# Patient Record
Sex: Female | Born: 1955 | Race: Black or African American | Hispanic: No | Marital: Single | Smoking: Former smoker
Health system: Southern US, Community
[De-identification: ages and names within clinical notes are randomized; demographics above are authoritative.]

## PROBLEM LIST (undated history)

## (undated) DIAGNOSIS — E785 Hyperlipidemia, unspecified: Secondary | ICD-10-CM

## (undated) DIAGNOSIS — H409 Unspecified glaucoma: Secondary | ICD-10-CM

## (undated) DIAGNOSIS — I10 Essential (primary) hypertension: Secondary | ICD-10-CM

## (undated) DIAGNOSIS — D649 Anemia, unspecified: Secondary | ICD-10-CM

## (undated) DIAGNOSIS — I639 Cerebral infarction, unspecified: Secondary | ICD-10-CM

## (undated) DIAGNOSIS — K219 Gastro-esophageal reflux disease without esophagitis: Secondary | ICD-10-CM

## (undated) DIAGNOSIS — E119 Type 2 diabetes mellitus without complications: Secondary | ICD-10-CM

## (undated) DIAGNOSIS — A4902 Methicillin resistant Staphylococcus aureus infection, unspecified site: Secondary | ICD-10-CM

## (undated) DIAGNOSIS — M1990 Unspecified osteoarthritis, unspecified site: Secondary | ICD-10-CM

## (undated) DIAGNOSIS — G4733 Obstructive sleep apnea (adult) (pediatric): Secondary | ICD-10-CM

## (undated) DIAGNOSIS — Z8719 Personal history of other diseases of the digestive system: Secondary | ICD-10-CM

## (undated) DIAGNOSIS — N183 Chronic kidney disease, stage 3 (moderate): Secondary | ICD-10-CM

## (undated) HISTORY — DX: Gastro-esophageal reflux disease without esophagitis: K21.9

## (undated) HISTORY — DX: Essential (primary) hypertension: I10

## (undated) HISTORY — DX: Type 2 diabetes mellitus without complications: E11.9

## (undated) HISTORY — DX: Unspecified glaucoma: H40.9

## (undated) HISTORY — DX: Obstructive sleep apnea (adult) (pediatric): G47.33

## (undated) HISTORY — DX: Anemia, unspecified: D64.9

## (undated) HISTORY — PX: MULTIPLE TOOTH EXTRACTIONS: SHX2053

## (undated) HISTORY — DX: Chronic kidney disease, stage 3 (moderate): N18.3

## (undated) HISTORY — DX: Hyperlipidemia, unspecified: E78.5

## (undated) HISTORY — PX: OOPHORECTOMY: SHX86

---
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
## 1997-06-05 ENCOUNTER — Emergency Department (HOSPITAL_COMMUNITY): Admission: EM | Admit: 1997-06-05 | Discharge: 1997-06-05 | Payer: Self-pay | Admitting: Emergency Medicine

## 2008-08-07 ENCOUNTER — Emergency Department (HOSPITAL_COMMUNITY): Admission: EM | Admit: 2008-08-07 | Discharge: 2008-08-07 | Payer: Self-pay | Admitting: Emergency Medicine

## 2009-01-17 HISTORY — PX: I & D EXTREMITY: SHX5045

## 2009-06-11 ENCOUNTER — Ambulatory Visit: Payer: Self-pay | Admitting: Vascular Surgery

## 2009-06-11 ENCOUNTER — Emergency Department (HOSPITAL_COMMUNITY): Admission: EM | Admit: 2009-06-11 | Discharge: 2009-06-12 | Payer: Self-pay | Admitting: Emergency Medicine

## 2009-06-11 ENCOUNTER — Encounter (INDEPENDENT_AMBULATORY_CARE_PROVIDER_SITE_OTHER): Payer: Self-pay | Admitting: Emergency Medicine

## 2009-06-12 ENCOUNTER — Encounter (INDEPENDENT_AMBULATORY_CARE_PROVIDER_SITE_OTHER): Payer: Self-pay | Admitting: Internal Medicine

## 2009-06-13 ENCOUNTER — Encounter: Payer: Self-pay | Admitting: Internal Medicine

## 2009-06-14 ENCOUNTER — Emergency Department (HOSPITAL_COMMUNITY): Admission: EM | Admit: 2009-06-14 | Discharge: 2009-06-14 | Payer: Self-pay | Admitting: Emergency Medicine

## 2009-06-16 ENCOUNTER — Emergency Department (HOSPITAL_COMMUNITY): Admission: EM | Admit: 2009-06-16 | Discharge: 2009-06-16 | Payer: Self-pay | Admitting: Emergency Medicine

## 2009-06-17 ENCOUNTER — Ambulatory Visit: Payer: Self-pay | Admitting: Internal Medicine

## 2009-06-17 DIAGNOSIS — E118 Type 2 diabetes mellitus with unspecified complications: Secondary | ICD-10-CM | POA: Insufficient documentation

## 2009-06-17 DIAGNOSIS — E1122 Type 2 diabetes mellitus with diabetic chronic kidney disease: Secondary | ICD-10-CM | POA: Insufficient documentation

## 2009-06-17 DIAGNOSIS — I10 Essential (primary) hypertension: Secondary | ICD-10-CM

## 2009-06-17 DIAGNOSIS — E119 Type 2 diabetes mellitus without complications: Secondary | ICD-10-CM | POA: Insufficient documentation

## 2009-06-17 DIAGNOSIS — N183 Chronic kidney disease, stage 3 unspecified: Secondary | ICD-10-CM | POA: Insufficient documentation

## 2009-06-17 HISTORY — DX: Type 2 diabetes mellitus without complications: E11.9

## 2009-06-17 HISTORY — DX: Essential (primary) hypertension: I10

## 2009-06-17 LAB — CONVERTED CEMR LAB
ALT: <8 units/L (ref 0–35)
AST: 13 units/L (ref 0–37)
Albumin: 3.9 g/dL (ref 3.5–5.2)
Alkaline Phosphatase: 61 units/L (ref 39–117)
BUN: 23 mg/dL (ref 6–23)
CO2: 25 meq/L (ref 19–32)
Calcium: 10.5 mg/dL (ref 8.4–10.5)
Chloride: 100 meq/L (ref 96–112)
Cholesterol: 151 mg/dL (ref 0–200)
Creatinine, Ser: 0.91 mg/dL (ref 0.40–1.20)
Creatinine, Urine: 132.4 mg/dL
Free T4: 1.19 ng/dL (ref 0.80–1.80)
Glucose, Bld: 138 mg/dL — ABNORMAL HIGH (ref 70–99)
HDL: 46 mg/dL (ref >39)
LDL Cholesterol: 84 mg/dL (ref 0–99)
Microalb Creat Ratio: 18.1 mg/g (ref 0.0–30.0)
Microalb, Ur: 2.4 mg/dL — ABNORMAL HIGH (ref 0.00–1.89)
Potassium: 4.6 meq/L (ref 3.5–5.3)
Sodium: 138 meq/L (ref 135–145)
TSH: 1.462 microintl units/mL (ref 0.350–4.5)
Total Bilirubin: 0.4 mg/dL (ref 0.3–1.2)
Total CHOL/HDL Ratio: 3.3
Total Protein: 8 g/dL (ref 6.0–8.3)
Triglycerides: 105 mg/dL (ref <150)
VLDL: 21 mg/dL (ref 0–40)

## 2009-07-01 ENCOUNTER — Ambulatory Visit: Payer: Self-pay | Admitting: Internal Medicine

## 2009-07-01 LAB — CONVERTED CEMR LAB: Blood Glucose, Home Monitor: 4 mg/dL

## 2009-07-06 ENCOUNTER — Telehealth: Payer: Self-pay | Admitting: Internal Medicine

## 2009-08-07 ENCOUNTER — Ambulatory Visit: Payer: Self-pay | Admitting: Internal Medicine

## 2009-08-07 ENCOUNTER — Encounter: Payer: Self-pay | Admitting: Internal Medicine

## 2009-08-07 LAB — CONVERTED CEMR LAB
Blood Glucose, Fingerstick: 129
Hgb A1c MFr Bld: 8.5 %

## 2009-09-22 ENCOUNTER — Telehealth: Payer: Self-pay | Admitting: *Deleted

## 2009-09-24 ENCOUNTER — Ambulatory Visit: Payer: Self-pay | Admitting: Internal Medicine

## 2009-09-24 ENCOUNTER — Ambulatory Visit: Payer: Self-pay | Admitting: Surgery

## 2009-09-24 ENCOUNTER — Ambulatory Visit: Admission: RE | Admit: 2009-09-24 | Discharge: 2009-09-24 | Payer: Self-pay | Admitting: Internal Medicine

## 2009-09-24 ENCOUNTER — Encounter: Payer: Self-pay | Admitting: Internal Medicine

## 2009-09-24 DIAGNOSIS — R609 Edema, unspecified: Secondary | ICD-10-CM | POA: Insufficient documentation

## 2009-09-25 ENCOUNTER — Encounter: Payer: Self-pay | Admitting: Internal Medicine

## 2009-10-06 ENCOUNTER — Ambulatory Visit (HOSPITAL_COMMUNITY): Admission: RE | Admit: 2009-10-06 | Discharge: 2009-10-06 | Payer: Self-pay | Admitting: Internal Medicine

## 2009-10-08 ENCOUNTER — Ambulatory Visit: Payer: Self-pay | Admitting: Internal Medicine

## 2009-10-08 LAB — CONVERTED CEMR LAB
Blood Glucose, Fingerstick: 137
Hgb A1c MFr Bld: 6.1 %

## 2009-10-28 ENCOUNTER — Ambulatory Visit: Payer: Self-pay | Admitting: Internal Medicine

## 2009-10-28 DIAGNOSIS — S91109A Unspecified open wound of unspecified toe(s) without damage to nail, initial encounter: Secondary | ICD-10-CM | POA: Insufficient documentation

## 2009-10-28 LAB — CONVERTED CEMR LAB: Blood Glucose, Fingerstick: 128

## 2009-11-03 ENCOUNTER — Encounter (HOSPITAL_BASED_OUTPATIENT_CLINIC_OR_DEPARTMENT_OTHER): Admission: RE | Admit: 2009-11-03 | Discharge: 2009-12-04 | Payer: Self-pay | Admitting: General Surgery

## 2009-11-04 ENCOUNTER — Ambulatory Visit (HOSPITAL_COMMUNITY): Admission: RE | Admit: 2009-11-04 | Discharge: 2009-11-04 | Payer: Self-pay | Admitting: General Surgery

## 2009-11-09 ENCOUNTER — Encounter: Payer: Self-pay | Admitting: Internal Medicine

## 2009-11-19 ENCOUNTER — Encounter: Payer: Self-pay | Admitting: Internal Medicine

## 2009-11-30 ENCOUNTER — Ambulatory Visit: Payer: Self-pay | Admitting: Internal Medicine

## 2009-11-30 ENCOUNTER — Encounter: Payer: Self-pay | Admitting: Internal Medicine

## 2009-11-30 LAB — CONVERTED CEMR LAB: Blood Glucose, Fingerstick: 92

## 2009-12-02 ENCOUNTER — Ambulatory Visit: Payer: Self-pay | Admitting: Vascular Surgery

## 2009-12-04 ENCOUNTER — Emergency Department (HOSPITAL_COMMUNITY)
Admission: EM | Admit: 2009-12-04 | Discharge: 2009-12-04 | Payer: Self-pay | Source: Home / Self Care | Admitting: Emergency Medicine

## 2009-12-14 ENCOUNTER — Encounter: Payer: Self-pay | Admitting: Internal Medicine

## 2009-12-17 ENCOUNTER — Ambulatory Visit: Payer: Self-pay | Admitting: Internal Medicine

## 2009-12-17 LAB — CONVERTED CEMR LAB
Blood Glucose, Fingerstick: 61
Hgb A1c MFr Bld: 5.7 %

## 2009-12-18 ENCOUNTER — Ambulatory Visit: Payer: Self-pay | Admitting: Internal Medicine

## 2009-12-18 LAB — CONVERTED CEMR LAB
BUN: 19 mg/dL (ref 6–23)
CO2: 27 meq/L (ref 19–32)
Calcium: 9.7 mg/dL (ref 8.4–10.5)
Chloride: 103 meq/L (ref 96–112)
Creatinine, Ser: 0.84 mg/dL (ref 0.40–1.20)
Glucose, Bld: 172 mg/dL — ABNORMAL HIGH (ref 70–99)
Potassium: 4 meq/L (ref 3.5–5.3)
Sodium: 138 meq/L (ref 135–145)

## 2009-12-28 ENCOUNTER — Ambulatory Visit: Payer: Self-pay | Admitting: Vascular Surgery

## 2009-12-28 ENCOUNTER — Encounter: Payer: Self-pay | Admitting: Internal Medicine

## 2009-12-29 ENCOUNTER — Ambulatory Visit: Payer: Self-pay | Admitting: Internal Medicine

## 2009-12-29 LAB — CONVERTED CEMR LAB: Blood Glucose, Fingerstick: 139

## 2009-12-29 LAB — HM DIABETES FOOT EXAM

## 2009-12-29 LAB — HM DIABETES EYE EXAM: HM Diabetic Eye Exam: NORMAL

## 2010-01-01 ENCOUNTER — Ambulatory Visit (HOSPITAL_COMMUNITY)
Admission: RE | Admit: 2010-01-01 | Discharge: 2010-01-01 | Payer: Self-pay | Source: Home / Self Care | Attending: Internal Medicine | Admitting: Internal Medicine

## 2010-01-01 ENCOUNTER — Telehealth: Payer: Self-pay | Admitting: *Deleted

## 2010-01-04 ENCOUNTER — Ambulatory Visit: Payer: Self-pay | Admitting: Vascular Surgery

## 2010-01-06 ENCOUNTER — Telehealth: Payer: Self-pay | Admitting: *Deleted

## 2010-01-07 ENCOUNTER — Encounter: Payer: Self-pay | Admitting: Internal Medicine

## 2010-01-07 ENCOUNTER — Ambulatory Visit: Payer: Self-pay | Admitting: Internal Medicine

## 2010-01-07 ENCOUNTER — Inpatient Hospital Stay (HOSPITAL_COMMUNITY)
Admission: AD | Admit: 2010-01-07 | Discharge: 2010-01-15 | Payer: Self-pay | Source: Home / Self Care | Attending: Internal Medicine | Admitting: Internal Medicine

## 2010-01-07 DIAGNOSIS — L97529 Non-pressure chronic ulcer of other part of left foot with unspecified severity: Secondary | ICD-10-CM

## 2010-01-07 DIAGNOSIS — E11621 Type 2 diabetes mellitus with foot ulcer: Secondary | ICD-10-CM | POA: Insufficient documentation

## 2010-01-07 HISTORY — DX: Type 2 diabetes mellitus with foot ulcer: L97.529

## 2010-01-07 HISTORY — DX: Type 2 diabetes mellitus with foot ulcer: E11.621

## 2010-01-08 ENCOUNTER — Encounter: Payer: Self-pay | Admitting: Internal Medicine

## 2010-01-12 ENCOUNTER — Encounter: Payer: Self-pay | Admitting: Ophthalmology

## 2010-01-12 DIAGNOSIS — D509 Iron deficiency anemia, unspecified: Secondary | ICD-10-CM | POA: Insufficient documentation

## 2010-01-12 DIAGNOSIS — D649 Anemia, unspecified: Secondary | ICD-10-CM | POA: Insufficient documentation

## 2010-01-13 ENCOUNTER — Encounter: Payer: Self-pay | Admitting: Internal Medicine

## 2010-01-15 ENCOUNTER — Encounter: Payer: Self-pay | Admitting: Internal Medicine

## 2010-01-22 ENCOUNTER — Ambulatory Visit: Admission: RE | Admit: 2010-01-22 | Discharge: 2010-01-22 | Payer: Self-pay | Source: Home / Self Care

## 2010-01-22 LAB — CONVERTED CEMR LAB: Blood Glucose, Fingerstick: 154

## 2010-01-22 LAB — GLUCOSE, CAPILLARY: Glucose-Capillary: 154 mg/dL — ABNORMAL HIGH (ref 70–99)

## 2010-01-24 LAB — CONVERTED CEMR LAB
BUN: 36 mg/dL — ABNORMAL HIGH (ref 6–23)
Basophils Absolute: 0 10*3/uL (ref 0.0–0.1)
Basophils Relative: 1 % (ref 0–1)
CO2: 27 meq/L (ref 19–32)
Calcium: 9.5 mg/dL (ref 8.4–10.5)
Chloride: 101 meq/L (ref 96–112)
Creatinine, Ser: 0.94 mg/dL (ref 0.40–1.20)
Eosinophils Absolute: 0.4 10*3/uL (ref 0.0–0.7)
Eosinophils Relative: 4 % (ref 0–5)
Glucose, Bld: 114 mg/dL — ABNORMAL HIGH (ref 70–99)
HCT: 30.9 % — ABNORMAL LOW (ref 36.0–46.0)
Hemoglobin: 10.2 g/dL — ABNORMAL LOW (ref 12.0–15.0)
Lymphocytes Relative: 30 % (ref 12–46)
Lymphs Abs: 2.7 10*3/uL (ref 0.7–4.0)
MCHC: 33 g/dL (ref 30.0–36.0)
MCV: 82.6 fL (ref 78.0–100.0)
Monocytes Absolute: 0.7 10*3/uL (ref 0.1–1.0)
Monocytes Relative: 8 % (ref 3–12)
Neutro Abs: 5.1 10*3/uL (ref 1.7–7.7)
Neutrophils Relative %: 57 % (ref 43–77)
Platelets: 320 10*3/uL (ref 150–400)
Potassium: 5.1 meq/L (ref 3.5–5.3)
RBC: 3.74 M/uL — ABNORMAL LOW (ref 3.87–5.11)
RDW: 16.1 % — ABNORMAL HIGH (ref 11.5–15.5)
Sodium: 136 meq/L (ref 135–145)
WBC: 8.8 10*3/uL (ref 4.0–10.5)

## 2010-01-25 ENCOUNTER — Encounter (HOSPITAL_BASED_OUTPATIENT_CLINIC_OR_DEPARTMENT_OTHER)
Admission: RE | Admit: 2010-01-25 | Discharge: 2010-02-16 | Payer: Self-pay | Source: Home / Self Care | Attending: Internal Medicine | Admitting: Internal Medicine

## 2010-02-01 LAB — GLUCOSE, CAPILLARY: Glucose-Capillary: 245 mg/dL — ABNORMAL HIGH (ref 70–99)

## 2010-02-03 LAB — GLUCOSE, CAPILLARY: Glucose-Capillary: 144 mg/dL — ABNORMAL HIGH (ref 70–99)

## 2010-02-07 NOTE — Consult Note (Signed)
[name], [name]              ACCOUNT NO.:  [id number]  MEDICAL RECORD NO.:  [id number]          PATIENT TYPE:  INP  LOCATION:  5509                         FACILITY:  [facility]  PHYSICIAN:  Willis Modena, MD     DATE OF BIRTH:  [birth year]  DATE OF CONSULTATION:  01/11/2010 DATE OF DISCHARGE:                                CONSULTATION   REASON FOR CONSULTATION:  Normocytic anemia.  CONSULTING PHYSICIAN:  Tilford Pillar, MD  CHIEF COMPLAINT:  Leg pain.  HISTORY OF PRESENT ILLNESS:  [name] is a 55 year old female with diabetes who was recently admitted for left lower extremity foot pain. She was found to have an infected diabetic foot ulcer that underwent irrigation and debridement, incision and drainage.  I was asked to see [name] for evaluation of anemia.  During her evaluation here, she has had a slow drop in hemoglobin from 8.6-7.7.  She is Hemoccult negative. She has no abdominal pain, nausea, vomiting, odynophagia, heartburn, change in bowel habits, hematemesis, melena, or hematochezia.  She has had some chronic dysphagia for the past several months postdating and intubation.  She has never had an endoscopy or colonoscopy.  There is no known family history of colon cancer or colon polyps.  PAST MEDICAL HISTORY:  Type 2 diabetes, hypertension, and obesity.  ALLERGIES:  CODEINE.  MEDICATIONS AT HOME:  Metformin, lisinopril, and multivitamin.  SOCIAL HISTORY:  Lives in [place], Runner, broadcasting/film/video for special occasion, applying job at [employer] where apparently is on. Single.  No children.  Remote tobacco.  No alcohol or illicit drugs.  FAMILY HISTORY:  Mother has diabetes.  Father with hypertension and coronary artery disease.  No family history of colon cancer or colon polyps.  REVIEW OF SYSTEMS:  As per history of present illness.  All other systems were explored in detail and are negative.  PHYSICAL EXAMINATION:  VITAL SIGNS:  Blood pressure  142/84, heart rate 80, respiratory rate 20, temperature 98.6, and oxygen saturation 95% on room air. GENERAL:  [name] appears her stated age.  She is in no acute distress. ENT:  Normocephalic and atraumatic.  She has poor dentition.  No oropharyngeal lesions.  No thrush. EYES:  Sclerae are anicteric.  Conjunctivae are slightly pale. NECK:  Supple without thyromegaly, lymphadenopathy, or bruits. LUNGS:  Clear to auscultation without rales, rhonchi, or wheeze. HEART:  Regular rhythm, normal rate, without murmur, rub, or gallop. ABDOMEN:  Soft, nontender, and nondistended.  No liver or splenic enlargement.  No bulging flanks to suggest ascites. EXTREMITIES:  She has left lower extremity edema from her mid shin down to her foot, there is some tenderness.  I did not evaluate her surgical wound. NEUROLOGIC:  Diffusely weak, but also some tenderness and weakness of her left lower extremity after her surgery, otherwise nonfocal without lateralizing signs. PSYCHIATRIC:  Normal mood and affect. LYMPHATIC:  No palpable axillary, submandibular, or supraclavicular adenopathy. SKIN:  No rash or ecchymosis.  She has a surgical wound to her left foot as mentioned.  LABORATORY STUDIES:  White count 8.4, hemoglobin 7.7, and platelet count 264.  Sodium 138, potassium 4.7,  chloride 105, bicarb 29, BUN 8, creatinine 0.8.  Actually, liver tests were not done.  Hemoccult, she is heme negative.  IMPRESSION:  [name] is a 55 year old female with left diabetic foot ulcer with abscess, status post incision and drainage about 2 days ago. I have been asked to see her for normocytic anemia with heme-negative stools.  PLAN: 1. I agree with supportive management of her diabetic foot ulcer. 2. I think she would benefit from an endoscopy and colonoscopy.  I     would favor doing it as an outpatient given the troubles with a     bowel prep in the setting of her recent foot surgery.  However, I     will  discuss this with her.  Thanks again for allowing me to participate in [name]'s care.     Willis Modena, MD     WO/MEDQ  D:  01/11/2010  T:  01/12/2010  Job:  [id number]  Electronically Signed by Willis Modena  on 02/07/2010 03:26:52 PM

## 2010-02-15 ENCOUNTER — Ambulatory Visit: Admission: RE | Admit: 2010-02-15 | Discharge: 2010-02-15 | Payer: Self-pay | Source: Home / Self Care

## 2010-02-15 LAB — GLUCOSE, CAPILLARY: Glucose-Capillary: 105 mg/dL — ABNORMAL HIGH (ref 70–99)

## 2010-02-17 ENCOUNTER — Other Ambulatory Visit: Payer: Self-pay

## 2010-02-17 ENCOUNTER — Encounter (HOSPITAL_BASED_OUTPATIENT_CLINIC_OR_DEPARTMENT_OTHER): Payer: Self-pay | Attending: General Surgery

## 2010-02-17 DIAGNOSIS — L97509 Non-pressure chronic ulcer of other part of unspecified foot with unspecified severity: Secondary | ICD-10-CM | POA: Insufficient documentation

## 2010-02-17 DIAGNOSIS — E1169 Type 2 diabetes mellitus with other specified complication: Secondary | ICD-10-CM | POA: Insufficient documentation

## 2010-02-17 DIAGNOSIS — I10 Essential (primary) hypertension: Secondary | ICD-10-CM | POA: Insufficient documentation

## 2010-02-18 ENCOUNTER — Other Ambulatory Visit: Payer: Self-pay

## 2010-02-18 NOTE — Discharge Summary (Signed)
Summary: Hospital Discharge Update    Hospital Discharge Update:  Date of Admission: 01/07/2010 Date of Discharge: 01/14/2010  Brief Summary:  This is a 55 year old female with hx of DM and HTN who initially presented to the clinic for a worsening draining blister hear her heal and foot swelling.  MRI was done which showed a hind foot abscess, cellulitis and posterior compartment myositis.  Surgery performed and I+D on 12/24.  The patient was placed on IV antibiotics.   Upon admission, pt was also found to be anemic at 7.8 and though B12, ferritin, and folate were all normal, pts iron was low at 11 and it was felt that should could have iron deficiency anemia of an unknown cause.  As a result, GI was consulted to perform a colonoscopy and EGD. Colonoscopy was normal and a 5 year follow up was recommended.  EGD showed a GE junction nodule of which a biopsy was taken.  Duodenal biopsies were also taken to check for celiac disease.  Pt was discharged with a 7 day course of augmentin and doxycycline.   Lab or other results pending at discharge:  Pathology reports from upper endoscopy.  Labs needed at follow-up: CBC with differential, Basic metabolic panel  Other follow-up issues:  Pt's BP was elevated in the hospital.  HCTZ was added.  Rechek BP and consider adjusting medications.  Check BMET given initation of HCTZ.  Continue to work up pt's anemia.    Problem list changes:  Added new problem of ANEMIA, NORMOCYTIC (ICD-285.9)  Medication list changes:  Added new medication of HYDROCHLOROTHIAZIDE 25 MG TABS (HYDROCHLOROTHIAZIDE) Take 1 tablet by mouth once a day - Signed Added new medication of AUGMENTIN 500-125 MG TABS (AMOXICILLIN-POT CLAVULANATE) Take 1 tablet by mouth two times a day for 7 days. - Signed Added new medication of DOXYCYCLINE HYCLATE 100 MG CAPS (DOXYCYCLINE HYCLATE) Take 1 tablet by mouth two times a day for 7 days. - Signed Rx of HYDROCHLOROTHIAZIDE 25 MG TABS  (HYDROCHLOROTHIAZIDE) Take 1 tablet by mouth once a day;  #30 x 0;  Signed;  Entered by: Sinda Du MD;  Authorized by: Sinda Du MD;  Method used: Print then Give to Patient Rx of AUGMENTIN 500-125 MG TABS (AMOXICILLIN-POT CLAVULANATE) Take 1 tablet by mouth two times a day for 7 days.;  #14 x 0;  Signed;  Entered by: Sinda Du MD;  Authorized by: Sinda Du MD;  Method used: Print then Give to Patient Rx of DOXYCYCLINE HYCLATE 100 MG CAPS (DOXYCYCLINE HYCLATE) Take 1 tablet by mouth two times a day for 7 days.;  #14 x 0;  Signed;  Entered by: Sinda Du MD;  Authorized by: Sinda Du MD;  Method used: Print then Give to Patient  The medication, problem, and allergy lists have been updated.  Please see the dictated discharge summary for details.  Discharge medications:  METFORMIN HCL 1000 MG TABS (METFORMIN HCL) take one tablet daily LISINOPRIL 40 MG TABS (LISINOPRIL) Take one tablet by mouth daily for Blood pressure MULTIVITAMINS  TABS (MULTIPLE VITAMIN) one tablet once a day HYDROCHLOROTHIAZIDE 25 MG TABS (HYDROCHLOROTHIAZIDE) Take 1 tablet by mouth once a day AUGMENTIN 500-125 MG TABS (AMOXICILLIN-POT CLAVULANATE) Take 1 tablet by mouth two times a day for 7 days. DOXYCYCLINE HYCLATE 100 MG CAPS (DOXYCYCLINE HYCLATE) Take 1 tablet by mouth two times a day for 7 days.  Other patient instructions:  Please come for an appointment at the outpatient clinic at [facility] on 1/6  at 2:30 pm with Dr. Tonny Branch for a followup visit.  Please follow up with Dr. Gayla Doss in 1 week call [phone] to make an appointment. Please follow up with the wound care center at [facility] in 1 week for hydrotherapy.  Call [phone] to make an appointment.   Home health will assist you with your dressing changes, please remember to soak your foot daily as directed by Dr. Magnus Ivan.   Please take your medication as prescribed below.  If you have any problem, Please call the clinic.   In  case of an emergency  dial 911 or go to the emergency department.   Note: Hospital Discharge Medications & Other Instructions handout was printed, one copy for patient and a second copy to be placed in hospital chart.

## 2010-02-18 NOTE — Progress Notes (Signed)
Summary: Refill/gh  Phone Note Refill Request Message from:  Patient on July 06, 2009 4:42 PM  Refills Requested: Medication #1:  METFORMIN HCL 1000 MG TABS take 1 pill by mouth two times a day.  Medication #2:  GLIMEPIRIDE 4 MG TABS take 1 pill by mouth daily. Last vist was 07/01/2009.  Last labs were 06/17/2009   Method Requested: Electronic Initial call taken by: Angelina Ok RN,  July 06, 2009 4:43 PM  Follow-up for Phone Call        Pt doesn't have RTC appt so sent flag to [facility] Lissa Hoard to make appt mid july.   Follow-up by: Blanch Media MD,  July 06, 2009 5:01 PM    Prescriptions: METFORMIN HCL 1000 MG TABS (METFORMIN HCL) take 1 pill by mouth two times a day.  #60 x 1   Entered and Authorized by:   Blanch Media MD   Signed by:   Blanch Media MD on 07/06/2009   Method used:   Electronically to        [facility] [phone]* (retail)       [address]       [place], [place]  [address]       Ph: [phone]       Fax: [phone]   RxID:   [id number] GLIMEPIRIDE 4 MG TABS (GLIMEPIRIDE) take 1 pill by mouth daily.  #30 x 1   Entered and Authorized by:   Blanch Media MD   Signed by:   Blanch Media MD on 07/06/2009   Method used:   Electronically to        [facility] [phone]* (retail)       [address]       [place], [place]  [address]       Ph: [phone]       Fax: [phone]   RxID:   [id number]

## 2010-02-18 NOTE — Progress Notes (Signed)
  Phone Note Call from Patient   Summary of Call: pt had ct this am 12/16, wants to know for sure when to restart metformin...mon 12/19 Initial call taken by: Marin Roberts RN,  January 05, 2010 4:30 PM  Follow-up for Phone Call        That will be fine. Follow-up by: Zoila Shutter MD,  January 01, 2010 2:03 PM  Additional Follow-up for Phone Call Additional follow up Details #1::        pt informed Additional Follow-up by: Marin Roberts RN,  January 05, 2010 4:31 PM

## 2010-02-18 NOTE — Assessment & Plan Note (Signed)
Summary: DIABETES TEACHING PER dR. pOKHAREL/[facility]   Allergies: 1)  ! Codeine   Complete Medication List: 1)  Glimepiride 4 Mg Tabs (Glimepiride) .... Take 1 pill by mouth daily. 2)  Metformin Hcl 1000 Mg Tabs (Metformin hcl) .... Take 1 pill by mouth two times a day. 3)  Lisinopril 10 Mg Tabs (Lisinopril) .... Take 1 pill by mouth daily. 4)  Bactrim Ds 800-160 Mg Tabs (Sulfamethoxazole-trimethoprim) .... Take 1 pill by mouth two times a day.  Other Orders: DSMT(Medicare) Individual, 30 Minutes (G0108)  Diabetes Self Management Training  PCP: Leodis Sias MD Date diagnosed with diabetes: 06/03/2009 Diabetes Type: Type 2 non-insulin Current smoking Status: quit  Diabetes Medications:  Comments: [facility] relion made by Tarry [employer]- cannot download it, CBg down form 300-400 2-3 weeks ago to 80-130 past week, some 60s nad she says she felt loopy- has significanlty cut back on food intake nad carbs- may level out hwne she eats carbs more reasonably and consistently.  Did not know anyhitng about her medications for diabetes today.     Monitoring Self monitoring blood glucose 4 times a day  Recent Episodes of: Requiring Help from another person  Hypoglycemia: Yes     Estimated /Usual Carb Intake Breakfast # of Carbs/Grams cheerios, banana and milk, coffee Lunch # of Carbs/Grams 1 chicken leg boiled, 1/2 slice bread, 5 pieces peaches in lite syrpu Midafternoon # of Carbs/Grams fruit Dinner # of Carbs/Grams salad wiht rotisserie chicken, or beets, turnip greens, trout, yams Diabetes Disease Process  Discussed today Define diabetes in simple terms: No knowledge   State own type of diabetes: No knowledgeState diabetes is treated by meal plan-exercise-medication-monitoring-education: Needs review/assistance    Medications State name-action-dose-duration-side effects-and time to take medication: No knowledge   State appropriate timing of food related to medication: No  knowledge    Nutritional Management Identify what foods most often affect blood glucose: Needs review/assistance    Verbalize importance of controlling food portions: Demonstrates competency   State importance of spacing and not omitting meals and snacks: Needs review/assistance   State changes planned for home meals/snacks: Needs review/assistance    Monitoring State purpose and frequency of monitoring BG-ketones-HgbA1C  : Needs review/assistance   Perform glucose monitoring/ketone testing and record results correctly: Demonstrates competencyState target blood glucose and HgbA1C goals: Needs review/assistance    Complications State the causes-signs and symptoms and prevention of Hyperglycemia: No knowledgeExplain proper treatment of hyperglycemia: No knowledgeState the causes- signs and symptoms and prevention of hypoglycemia: No knowledge   Explain proper treatment of hypoglycemia: Needs review/assistance   Glucose control and the development/prevention of long-term complications: Demonstrates competencyState benefits-risks-and options for improving blood sugar control: Demonstrates competencyB/P and lipid control in the prevention/control of cardiovascular disease: Demonstrates competency Exercise  Lifestyle changes:Goal setting and Problem solving State benefits of making appropriate lifestyle changes: Demonstrates competencyIdentify lifestyle behaviors that need to change: Demonstrates competencyIdentify risk factors that interfere with health: Needs review/assistanceDevelop strategies to reduce risk factors: No knowledgeVerbalize need for and frequency of health care follow-up: No knowledgeDiabetes Management Education Done: 07/01/2009    BEHAVIORAL GOALS INITIAL Incorporating appropriate nutritional management: try to eat at least 2-3 servings carb at each meal        Diabetes Self Management Support: friend, clinic visits, church family Follow-up:4 weeks if you don;t move to  [facility]

## 2010-02-18 NOTE — Letter (Signed)
Summary: BLOOD GLUCOSE/06-13-04-15-2011  BLOOD GLUCOSE/06-13-04-15-2011   Imported By: Margie Billet 07/03/2009 10:42:04  _____________________________________________________________________  External Attachment:    Type:   Image     Comment:   External Document

## 2010-02-18 NOTE — Assessment & Plan Note (Signed)
Summary: L foot blister/pcp-pribula/hla   Vital Signs:  Patient profile:   55 year old female Height:      68.5 inches (173.99 cm) Weight:      237.1 pounds (107.77 kg) BMI:     35.65 Temp:     99.2 degrees F oral Pulse rate:   107 / minute BP sitting:   178 / 91  (right arm) Cuff size:   large  Vitals Entered By: Cynda Familia Duncan Dull) (January 07, 2010 3:10 PM) CC: ULCER-LEFT FOOT- Is Patient Diabetic? Yes Did you bring your meter with you today? Yes Nutritional Status BMI of > 30 = obese  Have you ever been in a relationship where you felt threatened, hurt or afraid?No   Does patient need assistance? Functional Status Self care Ambulation Normal   Primary Care Provider:  Leodis Sias MD  CC:  ULCER-LEFT FOOT-.  History of Present Illness: [name] is a pleasant 55 year old woman with pmh significant for DM, HTN, and diabetic foot ulcers, who presents to the clinic today for worsening blister and swelling of left foot.   The patient has had left leg and foot swelling since August, which she initially thought was due to an injury. Over the past few months, the edema has persisted and she has undergone vascular studies and was seen at Dr. Candie Chroman office 12/28/2009. She had doppler studies performed then which showed severe reflux in the deep system throughout. In the context of his note, he outlines that patient may have had DVT in the past, which cause valvular incompetence. Her venous duplex study was negative that day. She had no open draining ulcers, and thus was advised to wear ted-hose stockings, keep her legs elevated, and diuretic as needed.  She followed up at [facility] the following day, and had  CT abd/pelvis done to r/u any lymphatic obstruction causing left leg edema, which also was negative. She returns to clinic today for left foot lesion and worsening swelling. She states it started out as a blister/callus. Over the past couple of days, it had popped open. It is  located on the heel of the left foot and extends to mid-plantar region. She states it has been draining, with some occasional blood. She said the odor has become quite foul over the past 2 days. She has not taken her temperature, but says she has been feeling warmer than usual. She denies chills, abdominal pain, changes in bowel or urinary habits. She denies having any significant pain in that area, but notes, she lacks a lot of sensation in her feet due to diabetes. She denies recent trauma to foot. She does very little walking, and has been wearing white compression stocking as directed. She has also tried her best to keep her legs elevated.   Current Medications (verified): 1)  Metformin Hcl 1000 Mg Tabs (Metformin Hcl) .... Take One Tablet Daily 2)  Lisinopril 40 Mg Tabs (Lisinopril) .... Take One Tablet By Mouth Daily For Blood Pressure 3)  Multivitamins  Tabs (Multiple Vitamin) .... One Tablet Once A Day  Allergies: 1)  ! Codeine  Past History:  Past Medical History: Last updated: 08/07/2009 Morbid obesity formerly over 400 lbs.  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 Smoked when she was a teenager no alcohol no Drugs  Risk Factors: Alcohol Use: 0 (12/29/2009) Exercise: yes (12/29/2009)  Risk Factors: Smoking Status: quit (12/29/2009)  Review of Systems      See HPI  Physical Exam  General:  alert and well-developed.  BP elevated Mouth:  poor dentition and teeth missing.   Neck:  supple.   Lungs:  normal respiratory effort and normal breath sounds.   Heart:  regular rhythm and  tachycardia.   Abdomen:  soft and non-tender.   Pulses:  DP pulse in R extremity absence of pulses in left lower extremity Extremities:  left lower extremity edema, greater than her baseline extending all the way to her thigh  left leg very warm to touch right LE normal, no edema, no ulcers or lesions  Neurologic:  nonfocal    Impression & Recommendations:  Problem # 1:  FOOT ULCER, LEFT (ICD-707.10) Assessment New Based on appearance of ulcer, it appears as a Grade 2 diabetic ulcer in the sense it penetrates to ligaments and muscles, involving full skin thickness. However, it is difficult to discern whether there is any underlying bone infection. With foul odor, purulent discharge, and subjective fevers, will admit patient for further evaluation to rule out osteo. In the past patient has had poor control of diabetes, however more recently, it is very well controlled. Of note, Dr. Kristin Pel who has seen patient in the past and was able to offer further insight noting significant worsening of both leg edema and ulcer which previously was not present.   Plan: -Admit to regular bed -Start broad spectrum antibiotics (Vanc and Zosyn to cover Staph and Pseudomonas) -Wound care consult regarding further recommendations on debridement -Consider vascular surgery vs ortho consult  -Repeat venous doppler -MRI of left leg r/o osteomyelitis   Problem # 2:  HYPERTENSION (ICD-401.9) Elevated. Suspect secondary to acute illness, however BP has been chronically elevated. Will not add a second agent in setting of #1. Will continue to monitor, and have admitting team watch closely.  Her updated medication list for this problem includes:    Lisinopril 40 Mg Tabs (Lisinopril) .Marland Kitchen... Take one tablet by mouth daily for blood pressure  BP today: 178/91 Prior BP: 150/82 (12/29/2009)  Prior 10 Yr Risk Heart Disease: 7 % (09/24/2009)  Labs Reviewed: K+: 4.0 (12/18/2009) Creat: : 0.84 (12/18/2009)   Chol: 151  (06/17/2009)   HDL: 46 (06/17/2009)   LDL: 84 (06/17/2009)   TG: 105 (06/17/2009)  Problem # 3:  DM (ICD-250.00) Well controlled. Will hold Metformin when pt admitted and start SSI.   Her updated medication list for this problem includes:    Metformin Hcl 1000 Mg Tabs (Metformin hcl) .Marland Kitchen... Take one tablet daily    Lisinopril 40 Mg Tabs (Lisinopril) .Marland Kitchen... Take one tablet by mouth daily for blood pressure  Labs Reviewed: Creat: 0.84 (12/18/2009)     Last Eye Exam: normal (12/29/2009) Reviewed HgBA1c results: 5.7 (12/17/2009)  6.1 (10/08/2009)  Complete Medication List: 1)  Metformin Hcl 1000 Mg Tabs (Metformin hcl) .... Take one tablet daily 2)  Lisinopril 40 Mg Tabs (Lisinopril) .... Take one tablet by mouth daily for blood pressure 3)  Multivitamins Tabs (Multiple vitamin) .... One tablet once a day   Orders Added: 1)  Est. Patient Level IV [21308]     Prevention & Chronic Care Immunizations   Influenza vaccine: Fluvax Non-MCR  (09/24/2009)   Influenza vaccine  deferral: Deferred  (06/17/2009)   Influenza vaccine due: 09/18/2010    Tetanus booster: 12/29/2009: Tdap   Td booster deferral: Refused  (12/17/2009)   Tetanus booster due: 12/30/2019    Pneumococcal vaccine: Pneumovax  (12/29/2009)   Pneumococcal vaccine deferral: Refused  (12/17/2009)   Pneumococcal vaccine due: 10/31/2020  Colorectal Screening   Hemoccult: Not documented   Hemoccult action/deferral: Ordered  (11/30/2009)    Colonoscopy: Not documented   Colonoscopy action/deferral: GI referral  (12/29/2009)  Other Screening   Pap smear: Not documented   Pap smear action/deferral: Deferred  (10/08/2009)    Mammogram: ASSESSMENT: Negative - BI-RADS 1^MS DIGITAL SCREENING  (10/06/2009)   Mammogram action/deferral: Deferred  (10/08/2009)   Mammogram due: 10/07/2011   Smoking status: quit  (12/29/2009)  Diabetes Mellitus   HgbA1C: 5.7  (12/17/2009)   HgbA1C action/deferral: Ordered  (08/07/2009)    Hemoglobin A1C due: 12/18/2010    Eye exam: normal  (12/29/2009)   Diabetic eye exam action/deferral: Ophthalmology referral  (11/30/2009)   Eye exam due: 12/2010    Foot exam: yes  (12/29/2009)   Foot exam action/deferral: Do today   High risk foot: Yes  (12/29/2009)   Foot care education: Done  (12/29/2009)   Foot exam due: 12/30/2010    Urine microalbumin/creatinine ratio: 18.1  (06/17/2009)   Urine microalbumin/cr due: 06/18/2010    Diabetes flowsheet reviewed?: Yes   Progress toward A1C goal: At goal  Lipids   Total Cholesterol: 151  (06/17/2009)   LDL: 84  (06/17/2009)   LDL Direct: Not documented   HDL: 46  (06/17/2009)   Triglycerides: 105  (06/17/2009)   Lipid panel due: 06/18/2010  Hypertension   Last Blood Pressure: 178 / 91  (01/07/2010)   Serum creatinine: 0.84  (12/18/2009)   Serum potassium 4.0  (12/18/2009)  Self-Management Support :   Personal Goals (by the next clinic visit) :     Personal A1C goal: 7  (07/01/2009)     Personal blood pressure goal: 130/80  (07/01/2009)     Personal LDL goal: 100  (07/01/2009)    Patient will work on the following items until the next clinic visit to reach self-care goals:     Medications and monitoring: examine my feet every day  (01/07/2010)     Eating: drink diet soda or water instead of juice or soda, eat foods that are low in salt, eat baked foods instead of fried foods  (01/07/2010)     Activity: take a 30 minute walk every day  (11/30/2009)     Other: trying to stay within carb count discussed with CDE - does sit ups for exercise  (08/07/2009)    Diabetes self-management support: Written self-care plan  (01/07/2010)   Diabetes care plan printed   Last diabetes self-management training by diabetes educator: 07/01/2009    Hypertension self-management support: Written self-care plan  (01/07/2010)   Hypertension self-care plan printed.

## 2010-02-18 NOTE — Assessment & Plan Note (Addendum)
Summary: LEG SWALLOWING/SB.   Vital Signs:  Patient profile:   55 year old female Height:      68.5 inches (173.99 cm) Weight:      231 pounds (105.00 kg) BMI:     34.74 Temp:     97.2 degrees F (36.22 degrees C) oral Pulse rate:   94 / minute BP sitting:   120 / 72  (right arm) Cuff size:   large  Vitals Entered By: Angelina Ok RN (November 30, 2009 1:54 PM) Is Patient Diabetic? Yes Did you bring your meter with you today? No Pain Assessment Patient in pain? no      Nutritional Status BMI of > 30 = obese CBG Result 92  Have you ever been in a relationship where you felt threatened, hurt or afraid?No   Does patient need assistance? Functional Status Self care Ambulation Impaired:Risk for fall Comments Boot on right foot.  3 toes are swelling.  Needs another compression stocking.  Ptesent 1 cutting off circulation.   Primary Care Provider:  Leodis Sias MD   History of Present Illness: Pt is a 55 yo AAF with PMH of DM, HTN, right foot infections due to injury and left leg swelling who came here for f/u her left leg swelling. Her left leg swelling has been 6 months and gradullay worse and had 2 doppler recently which was negative for DVT. She will have an appointment with vascular surgery this Wednesday. No other c/o including CP, fever, SOB or abdominal pain. Her foot infection has been f/u by wound care and much better now. No drug, smoking or drug abuse.   Depression History:      The patient denies a depressed mood most of the day and a diminished interest in her usual daily activities.         Preventive Screening-Counseling & Management  Alcohol-Tobacco     Smoking Status: quit     Year Quit: only smoked when 55 y/o  Problems Prior to Update: 1)  Open Wound Toe Without Mention Complication  (ICD-893.0) 2)  Leg Edema  (ICD-782.3) 3)  Hypertension  (ICD-401.9) 4)  Dm  (ICD-250.00)  Medications Prior to Update: 1)  Metformin Hcl 1000 Mg Tabs (Metformin  Hcl) .... Take 1 Pill By Mouth Two Times A Day. 2)  Lisinopril 20 Mg Tabs (Lisinopril) .... Take 1 Pill By Mouth Daily. 3)  Multivitamins  Tabs (Multiple Vitamin) .... One Tablet Once A Day  Current Medications (verified): 1)  Metformin Hcl 1000 Mg Tabs (Metformin Hcl) .... Take 1 Pill By Mouth Two Times A Day. 2)  Lisinopril 20 Mg Tabs (Lisinopril) .... Take 1 Pill By Mouth Daily. 3)  Multivitamins  Tabs (Multiple Vitamin) .... One Tablet Once A Day  Allergies (verified): 1)  ! Codeine  Past History:  Past Medical History: Last updated: 08/07/2009 Morbid obesity formerly over 400 lbs.  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 Smoked when she was a teenager no alcohol no Drugs  Risk Factors: Smoking Status: quit (11/30/2009)  Family History: Reviewed history from 08/07/2009 and no changes required. Mother, MGF had diabetes Father's side has HTN, CAD, MI's Mother: Alzheimer's No hx  of Ovarian, breast, or colon cancer  Social History: Reviewed history from 08/07/2009 and no changes required. 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 Smoked when she was a teenager no alcohol no Drugs  Review of Systems       The patient complains of peripheral edema.  The patient denies fever, chest pain, syncope, dyspnea on exertion, prolonged cough, headaches, hemoptysis, abdominal pain, melena, and hematochezia.    Physical Exam  General:  alert, well-developed, well-nourished, and well-hydrated.   Head:   normocephalic.   Nose:  no nasal discharge.   Mouth:  pharynx pink and moist.   Neck:  supple.   Lungs:  normal respiratory effort, normal breath sounds, no crackles, and no wheezes.   Heart:  normal rate, regular rhythm, no murmur, and no JVD.   Abdomen:  soft, non-tender, normal bowel sounds, no distention, and no masses.   Msk:  Right foot was wrapped with dressing. Left leg 2+ edema up to knee, w/o open wound.  Pulses:  2+ Extremities:  2+ left pedal edema and trace right pedal edema.   Neurologic:  alert & oriented X3, cranial nerves II-XII intact, strength normal in all extremities, and gait normal.     Impression & Recommendations:  Problem # 1:  LEG EDEMA (ICD-782.3) Assessment Unchanged Her leg swelling has been about 6 months and doppler did not found any DVT. This may be due to venous insufficiency. She will see vascular surgery this Wednesday to find out the cause and any possible intervention to help her leg edema. Advised her to elevate the leg.   Discussed elevation of the legs, use of compression stockings, sodium restiction, and medication use.   Problem # 2:  DM (ICD-250.00) Assessment: Unchanged Well controlled and will have eye referral for annual exam. Continue metformin.  Her updated medication list for this problem includes:    Metformin Hcl 1000 Mg Tabs (Metformin hcl) .Marland Kitchen... Take 1 pill by mouth two times a day.    Lisinopril 20 Mg Tabs (Lisinopril) .Marland Kitchen... Take 1 pill by mouth daily.  Orders: Ophthalmology Referral (Ophthalmology)  Labs Reviewed: Creat: 0.91 (06/17/2009)    Reviewed HgBA1c results: 6.1 (10/08/2009)  8.5 (08/07/2009)  Problem # 3:  HYPERTENSION (ICD-401.9) Assessment: Improved BP well controlled. Continue lisinopril.  Her updated medication list for this problem includes:    Lisinopril 20 Mg Tabs (Lisinopril) .Marland Kitchen... Take 1 pill by mouth daily.  BP today: 120/72 Prior BP: 169/90 (10/28/2009)  Prior 10 Yr Risk Heart Disease: 7 %  (09/24/2009)  Labs Reviewed: K+: 4.6 (06/17/2009) Creat: : 0.91 (06/17/2009)   Chol: 151 (06/17/2009)   HDL: 46 (06/17/2009)   LDL: 84 (06/17/2009)   TG: 105 (06/17/2009)  Problem # 4:  OPEN WOUND TOE WITHOUT MENTION COMPLICATION (ICD-893.0) Assessment: Improved Her right foot infect has been better under wound care. Continue wound care.   Complete Medication List: 1)  Metformin Hcl 1000 Mg Tabs (Metformin hcl) .... Take 1 pill by mouth two times a day. 2)  Lisinopril 20 Mg Tabs (Lisinopril) .... Take 1 pill by mouth daily. 3)  Multivitamins Tabs (Multiple vitamin) .... One tablet once a day  Other Orders: Capillary Blood Glucose/CBG (13086) T-Hemoccult Card-Multiple (take home) (57846) Dental Referral (Dentist)  Patient Instructions: 1)  Please schedule a follow-up appointment in 3 weeks. 2)  Please follow your appointment with vascular surgery.    Orders Added: 1)  Capillary Blood Glucose/CBG [  82948] 2)  T-Hemoccult Card-Multiple (take home) [82270] 3)  Ophthalmology Referral [Ophthalmology] 4)  Dental Referral [Dentist] 5)  Est. Patient Level III [16109]    Prevention & Chronic Care Immunizations   Influenza vaccine: Fluvax Non-MCR  (09/24/2009)   Influenza vaccine deferral: Deferred  (06/17/2009)    Tetanus booster: Not documented   Td booster deferral: Deferred  (06/17/2009)    Pneumococcal vaccine: Not documented   Pneumococcal vaccine deferral: Deferred  (06/17/2009)  Colorectal Screening   Hemoccult: Not documented   Hemoccult action/deferral: Ordered  (11/30/2009)    Colonoscopy: Not documented   Colonoscopy action/deferral: Deferred  (06/17/2009)  Other Screening   Pap smear: Not documented   Pap smear action/deferral: Deferred  (10/08/2009)    Mammogram: ASSESSMENT: Negative - BI-RADS 1^MS DIGITAL SCREENING  (10/06/2009)   Mammogram action/deferral: Deferred  (10/08/2009)   Smoking status: quit  (11/30/2009)  Diabetes Mellitus   HgbA1C: 6.1   (10/08/2009)   HgbA1C action/deferral: Ordered  (08/07/2009)    Eye exam: Not documented   Diabetic eye exam action/deferral: Ophthalmology referral  (11/30/2009)    Foot exam: yes  (09/24/2009)   Foot exam action/deferral: Do today   High risk foot: Yes  (09/24/2009)   Foot care education: Done  (09/24/2009)    Urine microalbumin/creatinine ratio: 18.1  (06/17/2009)    Diabetes flowsheet reviewed?: Yes   Progress toward A1C goal: At goal  Lipids   Total Cholesterol: 151  (06/17/2009)   LDL: 84  (06/17/2009)   LDL Direct: Not documented   HDL: 46  (06/17/2009)   Triglycerides: 105  (06/17/2009)  Hypertension   Last Blood Pressure: 120 / 72  (11/30/2009)   Serum creatinine: 0.91  (06/17/2009)   Serum potassium 4.6  (06/17/2009)    Hypertension flowsheet reviewed?: Yes   Progress toward BP goal: At goal  Self-Management Support :   Personal Goals (by the next clinic visit) :     Personal A1C goal: 7  (07/01/2009)     Personal blood pressure goal: 130/80  (07/01/2009)     Personal LDL goal: 100  (07/01/2009)    Patient will work on the following items until the next clinic visit to reach self-care goals:     Medications and monitoring: take my medicines every day, check my blood pressure, examine my feet every day  (11/30/2009)     Eating: drink diet soda or water instead of juice or soda, eat more vegetables, use fresh or frozen vegetables, eat foods that are low in salt, eat baked foods instead of fried foods, eat fruit for snacks and desserts, limit or avoid alcohol  (11/30/2009)     Activity: take a 30 minute walk every day  (11/30/2009)     Other: trying to stay within carb count discussed with CDE - does sit ups for exercise  (08/07/2009)    Diabetes self-management support: Written self-care plan, Education handout, Pre-printed educational material, Resources for patients handout  (11/30/2009)   Diabetes care plan printed   Diabetes education handout printed   Last  diabetes self-management training by diabetes educator: 07/01/2009    Hypertension self-management support: Written self-care plan, Education handout, Pre-printed educational material, Resources for patients handout  (11/30/2009)   Hypertension self-care plan printed.   Hypertension education handout printed      Resource handout printed.   Nursing Instructions: Provide Hemoccult cards with instructions (see order) Refer for screening diabetic eye exam (see order)    Last LDL:  84 (06/17/2009 8:12:00 PM)        Vital Signs:  Patient profile:   55 year old female Height:      68.5 inches (173.99 cm) Weight:      231 pounds (105.00 kg) BMI:     34.74 Temp:     97.2 degrees F (36.22 degrees C) oral Pulse rate:   94 / minute BP sitting:   120 / 72  (right arm) Cuff size:   large  Vitals Entered By: Angelina Ok RN (November 30, 2009 1:54 PM)   Appended Document: Hemoccult Results/[facility]    Lab Visit  Laboratory Results  Date/Time Received: February 04, 2010 11:31 AM  Date/Time Reported: Burke Keels  February 04, 2010 11:31 AM   Stool - Occult Blood Hemmoccult #1: negative Hemoccult #2: negative Hemoccult #3: negative Comments: Hemoccult Cards given by Gabriel Rung on 11-30-09 [facility] Cards Analyzed by Burlene Arnt  February 04, 2010 11:32 AM   Orders Today:

## 2010-02-18 NOTE — Assessment & Plan Note (Signed)
Summary: L heel blister, found this am/pcp-pribula/hla   Vital Signs:  Patient profile:   55 year old female Height:      68.5 inches (173.99 cm) Weight:      231.9 pounds (105.41 kg) BMI:     34.87 Temp:     97.7 degrees F oral Pulse rate:   96 / minute BP sitting:   150 / 82  (right arm) Cuff size:   large  Vitals Entered By: Chinita Pester RN (December 29, 2009 2:57 PM) CC:  Left blister on side of foot. LLE edema. Is Patient Diabetic? Yes Did you bring your meter with you today? No Pain Assessment Patient in pain? no      Nutritional Status BMI of > 30 = obese CBG Result 139  Have you ever been in a relationship where you felt threatened, hurt or afraid?No   Does patient need assistance? Functional Status Self care Ambulation Impaired:Risk for fall Comments wearing a hard shoe.   Diabetic Foot Exam Last Podiatry Exam Date: 12/29/2009  Foot Inspection Is there a history of a foot ulcer?              Yes Is there a foot ulcer now?              No Can the patient see the bottom of their feet?          Yes Are the shoes appropriate in style and fit?          No Is there swelling or an abnormal foot shape?          Yes Are the toenails long?                No Are the toenails thick?                Yes Are the toenails ingrown?              No Is there heavy callous build-up?              No Is there pain in the calf muscle (Intermittent claudication) when walking?    YesIs there a claw toe deformity?              No Is there elevated skin temperature?            Yes Is there limited ankle dorsiflexion?            No Is there foot or ankle muscle weakness?            No  Diabetic Foot Care Education Patient educated on appropriate care of diabetic feet.  Pulse Check          Right Foot          Left Foot Posterior Tibial:        normal            normal Dorsalis Pedis:        normal            Comments: Left foot lymphedema sp trauma since may of 2011. High Risk  Feet? Yes Set Next Diabetic Foot Exam here: 12/30/2010   10-g (5.07) Semmes-Weinstein Monofilament Test           Right Foot          Left Foot Site 1         normal         normal Site 4  normal         normal Site 5         normal         normal Site 6         normal         normal Site 8                    abnormal   Primary Care Provider:  Leodis Sias MD  CC:   Left blister on side of foot. LLE edema..  History of Present Illness: follow up on Left LE swelling. No improvement. per patient, was referred to aVascular surgeon by a Wound care clinic. Had a venous doppler US yesterday which was negative, per patient's report. Patient denies any new Sx.  Depression History:      The patient denies a depressed mood most of the day and a diminished interest in her usual daily activities.         Preventive Screening-Counseling & Management  Alcohol-Tobacco     Alcohol drinks/day: 0     Smoking Status: quit     Year Quit: only smoked when 55 y/o  Caffeine-Diet-Exercise     Does Patient Exercise: yes     Type of exercise: sometmes  Current Problems (verified): 1)  Dm  (ICD-250.00) 2)  Hypertension  (ICD-401.9) 3)  Open Wound Toe Without Mention Complication  (ICD-893.0) 4)  Leg Edema  (ICD-782.3)  Current Medications (verified): 1)  Metformin Hcl 1000 Mg Tabs (Metformin Hcl) .... Take 1 Pill By Mouth Two Times A Day. 2)  Lisinopril 20 Mg Tabs (Lisinopril) .... Take 1 Pill By Mouth Daily. 3)  Multivitamins  Tabs (Multiple Vitamin) .... One Tablet Once A Day  Allergies (verified): 1)  ! Codeine  Past History:  Past medical, surgical, family and social histories (including risk factors) reviewed for relevance to current acute and chronic problems.  Past Medical History: Reviewed history from 08/07/2009 and no changes required. Morbid obesity formerly over 400 lbs.  Past Surgical History: Reviewed history from 08/07/2009 and no changes  required. Oophectomy bilateral in 1975 Appendectomy  Family History: Reviewed history from 08/07/2009 and no changes required. 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 Smoked when she was a teenager no alcohol no DrugsDoes Patient Exercise:  yes  Physical Exam  General:  alert, well-developed, well-nourished, and well-hydrated.   Head:  normocephalic.   Mouth:  pharynx pink and moist.   Neck:  supple.   Lungs:  normal respiratory effort, normal breath sounds, no crackles, and no wheezes.   Heart:  normal rate, regular rhythm, no murmur, and no JVD.   Abdomen:  soft, non-tender, normal bowel sounds, no distention, and no masses.   Msk:  normal ROM, no joint tenderness, no joint warmth, and no redness over joints.  normal ROM, no joint tenderness, no joint warmth, and no redness over joints.   Pulses:  2+B femoral, popliteal, tibial and dorsalis pedis normal.   Extremities:  3+ left pedal edema up to inguinal area with a thigh circumference  being 5 cm larger than then her right thigh and with a left calf circuference 4 cm larger than the right calf. No TTP; no increased warmth with  touch. Neurologic:  alert & oriented X3, cranial nerves II-XII intact, strength normal in all extremities, and gait normal.   Skin:  no suspicious lesions.  no suspicious lesions.   Psych:  Cognition and judgment appear intact. Alert and cooperative with normal attention span and concentration. No apparent delusions, illusions, hallucinations  Diabetes Management Exam:    Foot Exam (with socks and/or shoes not present):       Sensory-Pinprick/Light touch:          Left medial foot (L-4): normal          Left dorsal foot  (L-5): normal          Left lateral foot (S-1): normal          Right medial foot (L-4): normal          Right dorsal foot (L-5): normal          Right lateral foot (S-1): normal       Sensory-Monofilament:          Left foot: normal          Right foot: normal       Inspection:          Left foot: normal          Right foot: normal       Nails:          Left foot: thickened          Right foot: thickened    Foot Exam by Podiatrist:       Date: 12/29/2009       Results: early diabetic findings       Done by: Frederich Chick Exam:       Eye Exam done here today          Results: normal   Impression & Recommendations:  Problem # 1:  DM (ICD-250.00) Assessment Unchanged Controlled. will decrease Metform dose down to 100 mg by mouth dialy. patient is unable to afford an evaluation by an opthalmologist. Foot cre discussed. Her updated medication list for this problem includes:    Metformin Hcl 1000 Mg Tabs (Metformin hcl) .Marland Kitchen... Take one tablet daily    Lisinopril 40 Mg Tabs (Lisinopril) .Marland Kitchen... Take one tablet by mouth daily for blood pressure  Labs Reviewed: Creat: 0.84 (12/18/2009)    Reviewed HgBA1c results: 5.7 (12/17/2009)  6.1 (10/08/2009)  Her updated medication list for this problem includes:    Metformin Hcl 1000 Mg Tabs (Metformin hcl) .Marland Kitchen... Take 1 pill by mouth two times a day.    Lisinopril 20 Mg Tabs (Lisinopril) .Marland Kitchen... Take 1 pill by mouth daily.  Problem # 2:  HYPERTENSION (ICD-401.9) Assessment: Unchanged  Will increase Lisinopril to 40 mg by mouth daily; low salt diet, exercise reviewed. Her updated medication list for this problem includes:    Lisinopril 40 Mg Tabs (Lisinopril) .Marland Kitchen... Take one tablet by mouth daily for blood pressure  BP today: 150/82 Prior BP: 150/88 (12/17/2009)  Prior 10 Yr Risk Heart Disease: 7 % (09/24/2009)  Labs Reviewed: K+: 4.0 (12/18/2009) Creat: : 0.84 (12/18/2009)   Chol: 151 (06/17/2009)   HDL: 46 (06/17/2009)   LDL: 84  (06/17/2009)   TG: 105 (06/17/2009)  Problem # 3:  LEG EDEMA (ICD-782.3)  Left LE chronic edema since May of 2011 and induced after "twisting her ankle"->PV insufficiency vs lymphedema? Called wound care calinic and a [facility] and requested consult reports and and a venous doppler US  results (done 12/28/09). will review test results and recommendations. consider pelvic US to evaluate for obstruction. Foot Xray done in october of 2011 was negative for Frx. Patient is unable to afford compression stockings Rx-ed by a vascular specialist. Instructed to elevate her Left LE above heart level while being seated and asleep. will speak with Ms. Tessiatore ->might be able to assist with finding compression stockings (>30 mm of HG).  Discussed elevation of the legs, use of compression stockings, sodium restiction, and medication use.   Orders: CT with Contrast (CT w/ contrast)  Complete Medication List: 1)  Metformin Hcl 1000 Mg Tabs (Metformin hcl) .... Take one tablet daily 2)  Lisinopril 40 Mg Tabs (Lisinopril) .... Take one tablet by mouth daily for blood pressure 3)  Multivitamins Tabs (Multiple vitamin) .... One tablet once a day  Other Orders: Gastroenterology Referral (GI) Capillary Blood Glucose/CBG (16109) Tdap => 22yrs IM (60454) Pneumococcal Vaccine (09811) Admin 1st Vaccine (91478) Admin of Any Addtl Vaccine (29562)  Patient Instructions: 1)  Please, decrease Metfromin to one tablet by mouth Once a day. 2)  Please, make an appointment for a PAP. 3)  Please, note an increase in Lisinopril dose up to 40 mg once a day. 4)  Please, call with any questions. 5)  follow up with dr. Tonny Branch in 4 months. Prescriptions: METFORMIN HCL 1000 MG TABS (METFORMIN HCL) take one tablet daily  #30 x 11   Entered and Authorized by:   Deatra Robinson MD   Signed by:   Deatra Robinson MD on 12/29/2009   Method used:   Print then Give to Patient   RxID:   [id number] LISINOPRIL 40  MG TABS (LISINOPRIL) Take one tablet by mouth daily for Blood pressure  #30 x 11   Entered and Authorized by:   Deatra Robinson MD   Signed by:   Deatra Robinson MD on 12/29/2009   Method used:   Print then Give to Patient   RxID:   [id number]    Orders Added: 1)  Gastroenterology Referral [GI] 2)  Est. Patient Level III [64403] 3)  Capillary Blood Glucose/CBG [82948] 4)  Tdap => 62yrs IM [90715] 5)  Pneumococcal Vaccine [90732] 6)  Admin 1st Vaccine [90471] 7)  Admin of Any Addtl Vaccine [90472] 8)  CT with Contrast [CT w/ contrast]   Immunizations Administered:  Tetanus Vaccine:    Vaccine Type: Tdap    Site: right deltoid    Mfr: GlaxoSmithKline    Dose: 0.5 ml    Route: IM    Given by: Chinita Pester RN    Exp. Date: 11/06/2011    Lot #: [id number]    VIS given: 12/05/07 version given December 29, 2009.  Pneumonia Vaccine:    Vaccine Type: Pneumovax    Site: left deltoid    Mfr: Merck    Dose: 0.5 ml    Route: IM    Given by: Chinita Pester RN    Exp. Date: 03/02/2011    Lot #: [id number]    VIS given: 12/22/08 version given December 29, 2009.   Immunizations Administered:  Tetanus Vaccine:    Vaccine Type: Tdap    Site: right deltoid    Mfr: GlaxoSmithKline    Dose: 0.5 ml    Route: IM    Given by: Chinita Pester RN    Exp. Date: 11/06/2011    Lot #: [id number]    VIS given: 12/05/07 version given December 29, 2009.  Pneumonia Vaccine:    Vaccine Type:  Pneumovax    Site: left deltoid    Mfr: Merck    Dose: 0.5 ml    Route: IM    Given by: Chinita Pester RN    Exp. Date: 03/02/2011    Lot #: [id number]    VIS given: 12/22/08 version given December 29, 2009.   Prevention & Chronic Care Immunizations   Influenza vaccine: Fluvax Non-MCR  (09/24/2009)   Influenza vaccine deferral: Deferred  (06/17/2009)   Influenza vaccine due: 09/18/2010    Tetanus booster: 12/29/2009: Tdap   Td booster deferral: Refused  (12/17/2009)   Tetanus booster due:  12/30/2019    Pneumococcal vaccine: Pneumovax  (12/29/2009)   Pneumococcal vaccine deferral: Refused  (12/17/2009)   Pneumococcal vaccine due: 10/31/2020  Colorectal Screening   Hemoccult: Not documented   Hemoccult action/deferral: Ordered  (11/30/2009)    Colonoscopy: Not documented   Colonoscopy action/deferral: GI referral  (12/29/2009)  Other Screening   Pap smear: Not documented   Pap smear action/deferral: Deferred  (10/08/2009)    Mammogram: ASSESSMENT: Negative - BI-RADS 1^MS DIGITAL SCREENING  (10/06/2009)   Mammogram action/deferral: Deferred  (10/08/2009)   Mammogram due: 10/07/2011   Smoking status: quit  (12/29/2009)  Diabetes Mellitus   HgbA1C: 5.7  (12/17/2009)   HgbA1C action/deferral: Ordered  (08/07/2009)   Hemoglobin A1C due: 12/18/2010    Eye exam: normal  (12/29/2009)   Diabetic eye exam action/deferral: Ophthalmology referral  (11/30/2009)   Eye exam due: 12/2010    Foot exam: yes  (12/29/2009)   Foot exam action/deferral: Do today   High risk foot: Yes  (12/29/2009)   Foot care education: Done  (12/29/2009)   Foot exam due: 12/30/2010    Urine microalbumin/creatinine ratio: 18.1  (06/17/2009)   Urine microalbumin/cr due: 06/18/2010    Diabetes flowsheet reviewed?: Yes   Progress toward A1C goal: At goal    Stage of readiness to change (diabetes management): Maintenance  Lipids   Total Cholesterol: 151  (06/17/2009)   LDL: 84  (06/17/2009)   LDL Direct: Not documented   HDL: 46  (06/17/2009)   Triglycerides: 105  (06/17/2009)   Lipid panel due: 06/18/2010  Hypertension   Last Blood Pressure: 150 / 82  (12/29/2009)   Serum creatinine: 0.84  (12/18/2009)   Serum potassium 4.0  (12/18/2009)    Hypertension flowsheet reviewed?: Yes   Progress toward BP goal: Deteriorated    Stage of readiness to change (hypertension management): Action  Self-Management Support :   Personal Goals (by the next clinic visit) :     Personal A1C goal: 7   (07/01/2009)     Personal blood pressure goal: 130/80  (07/01/2009)     Personal LDL goal: 100  (07/01/2009)    Patient will work on the following items until the next clinic visit to reach self-care goals:     Medications and monitoring: take my medicines every day, bring all of my medications to every visit, examine my feet every day  (12/29/2009)     Eating: use fresh or frozen vegetables, eat foods that are low in salt, eat baked foods instead of fried foods  (12/29/2009)     Activity: take a 30 minute walk every day  (11/30/2009)     Other: trying to stay within carb count discussed with CDE - does sit ups for exercise  (08/07/2009)    Diabetes self-management support: Written self-care plan  (12/29/2009)   Diabetes care plan printed   Last diabetes self-management training by diabetes educator: 07/01/2009  Hypertension self-management support: Written self-care plan  (12/29/2009)   Hypertension self-care plan printed.   Nursing Instructions: Give Flu vaccine today Give tetanus booster today GI referral for screening colonoscopy (see order) Give Pneumovax today Diabetic foot exam today

## 2010-02-18 NOTE — Assessment & Plan Note (Signed)
Summary: REASSIGNED NEW TO DR/CFB   Vital Signs:  Patient profile:   55 year old female Height:      68.5 inches (173.99 cm) Weight:      236.5 pounds (107.50 kg) BMI:     35.56 Temp:     97.2 degrees F Pulse rate:   88 / minute BP sitting:   137 / 84  (right arm) Cuff size:   large  Vitals Entered By: Dorie Rank RN (August 07, 2009 1:42 PM) CC: check up - Wed. checked b/p at home and it was 124/76 - here to meet new dr Is Patient Diabetic? Yes Did you bring your meter with you today? logbook Nutritional Status BMI of > 30 = obese CBG Result 129  Have you ever been in a relationship where you felt threatened, hurt or afraid?No   Does patient need assistance? Functional Status Self care Ambulation Normal Comments b/p rechecked at 2:50 P - right arm, large cuff - 106/72 - pulse 82   Primary Care Provider:  Leodis Sias MD  CC:  check up - Wed. checked b/p at home and it was 124/76 - here to meet new dr.  History of Present Illness: Patient is a 55 year old female with newly diagnosed diabetes type II.  She is here today for a follow up as well as preventative maintenance and to meet her new PCP.  She states that she is feeling much better and taking all her medications as directed.  She otherwise has no complaint.  Preventive Screening-Counseling & Management  Alcohol-Tobacco     Smoking Status: quit     Year Quit: only smoked when 55 y/o  Current Medications (verified): 1)  Glimepiride 4 Mg Tabs (Glimepiride) .... Take 1 Pill By Mouth Daily. 2)  Metformin Hcl 1000 Mg Tabs (Metformin Hcl) .... Take 1 Pill By Mouth Two Times A Day. 3)  Lisinopril 20 Mg Tabs (Lisinopril) .... Take 1 Pill By Mouth Daily.  Allergies: 1)  ! Codeine  Past History:  Past medical, surgical, family and social histories (including risk factors) reviewed, and no changes noted (except as noted below).  Past Medical History: Morbid obesity formerly over 400 lbs.  Past Surgical  History: Oophectomy bilateral in 1975 Appendectomy  Family History: Reviewed history and no changes required. Mother, MGF had diabetes Father's side has HTN, CAD, MI's Mother: Alzheimer's No hx of Ovarian, breast, or colon cancer  Social History: Reviewed history and no changes required. 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 Smoked when she was a teenager no alcohol no Drugs  Review of Systems  The patient denies anorexia, fever, weight loss, weight gain, vision loss, decreased hearing, hoarseness, chest pain, syncope, dyspnea on exertion, peripheral edema, prolonged cough, headaches, hemoptysis, abdominal pain, melena, hematochezia, severe indigestion/heartburn, hematuria, incontinence, genital sores, muscle weakness, suspicious skin lesions, transient blindness, difficulty walking, depression, unusual weight change, abnormal bleeding, enlarged lymph nodes, angioedema, and breast masses.    Physical Exam  Additional Exam:  General: Well developed, female in no acute distress  Head: Atraumatic, normocephalic with no signs of trauma  Eyes: PERRLA, EOM intact  Ears: TM intact  Nose: Nares patent, mucosa is pink and moist, no polyps noted  Mouth: Mucosa is pink and moist, dention,   Neck: Supple, full ROM, no thyromegaly or masses noted  Resp: Clear to ascultation bilaterally, no wheezes, rales, or rhonchi noted  CV: Regular rate  and rhythm with no murmurs, rubs, or gallops noted  Abdomen: Soft, non-tender, non-distended with normal bowel sounds  Musculoskelatal: ROM full with intact strength, no pain to palpation  Neurologic: Alert and oriented x3, Cranial nerves II-XII grossly intact, DTR normal and symmetric  Pulses: Radial, brachial, carotid, femoral, dorsal pedis, and posterior tibial pulses equal and symmetric     Impression & Recommendations:  Problem # 1:  DM (ICD-250.00) She  reports that she feels significantly better since starting her medications.  I reviewed her home blood sugars and they are all excellent.  We will continue to follow her blood sugars once per day and alternate mornings, evenings, and post meals.  I will see her back in 3 months for a repeat HgBA1C and adjust her medications from there.  She is due for a diabetic eye exam and would also benefit from seeing Jamison Neighbor, RDE but we will hold off on these until she has her [facility] card from [employer].  Her updated medication list for this problem includes:    Glimepiride 4 Mg Tabs (Glimepiride) .Marland Kitchen... Take 1 pill by mouth daily.    Metformin Hcl 1000 Mg Tabs (Metformin hcl) .Marland Kitchen... Take 1 pill by mouth two times a day.    Lisinopril 20 Mg Tabs (Lisinopril) .Marland Kitchen... Take 1 pill by mouth daily.  Orders: T-Hgb A1C (in-house) [phone]) T- Capillary Blood Glucose (95621)  Labs Reviewed: Creat: 0.91 (06/17/2009)    Reviewed HgBA1c results: 8.5 (08/07/2009)  Problem # 2:  HYPERTENSION (ICD-401.9) Blood pressure today is good on recheck. We will continue the current dose of Lisinopril and follow up on this.  Her updated medication list for this problem includes:    Lisinopril 20 Mg Tabs (Lisinopril) .Marland Kitchen... Take 1 pill by mouth daily.  BP today: 137/84 Prior BP: 141/88 (07/01/2009)  Labs Reviewed: K+: 4.6 (06/17/2009) Creat: : 0.91 (06/17/2009)   Chol: 151 (06/17/2009)   HDL: 46 (06/17/2009)   LDL: 84 (06/17/2009)   TG: 105 (06/17/2009)  Problem # 3:  Preventive Health Care (ICD-V70.0) She has not seen a physician years.  She is due for Pap, Mammo, Colonoscopy, Tdap, Pneumovax, and Zoster immunization but would like to wait for this until she has insurance coverage.  She is going to follow up with Chauncey Reading in the next 2 weeks and then make an appointment to get these services done.  Problem # 4:  CELLULITIS (ICD-682.9) This has cleared up completely so I will inactivate it in her chart.  Complete  Medication List: 1)  Glimepiride 4 Mg Tabs (Glimepiride) .... Take 1 pill by mouth daily. 2)  Metformin Hcl 1000 Mg Tabs (Metformin hcl) .... Take 1 pill by mouth two times a day. 3)  Lisinopril 20 Mg Tabs (Lisinopril) .... Take 1 pill by mouth daily.  Patient Instructions: 1)  Keep checking your blood sugar once per day and alternate morning, evening, and after meals. 2)  Continue taking your medications 3)  Continue to monitor your blood pressure. 4)  Continue working on your diet and exercise.  Your doing well! 5)  Follow up with Jaynee Eagles for the orange card.  When completed make an appointment in the clinic for your preventative health check up. 6)  Follow up with me in 3 months. Prescriptions: GLIMEPIRIDE 4 MG TABS (GLIMEPIRIDE) take 1 pill by mouth daily.  #30 x 6   Entered and Authorized by:   Leodis Sias MD   Signed by:   Leodis Sias MD on 08/07/2009  Method used:   Electronically to        [facility] (retail)       [address]       [place], [place]  [address]       Ph: [phone] or [phone]       Fax: [phone]   RxID:   [id number] METFORMIN HCL 1000 MG TABS (METFORMIN HCL) take 1 pill by mouth two times a day.  #60 x 6   Entered and Authorized by:   Leodis Sias MD   Signed by:   Leodis Sias MD on 08/07/2009   Method used:   Electronically to        [facility] [address] (retail)       [address]       [place], [place]  [address]       Ph: [phone] or [phone]       Fax: [phone]   RxID:   [id number] LISINOPRIL 20 MG TABS (LISINOPRIL) take 1 pill by mouth daily.  #30 x 6   Entered and Authorized by:   Leodis Sias MD   Signed by:   Leodis Sias MD on 08/07/2009   Method used:   Print then Give to Patient   RxID:   [id number]    Prevention & Chronic Care Immunizations   Influenza  vaccine: Not documented   Influenza vaccine deferral: Deferred  (06/17/2009)    Tetanus booster: Not documented   Td booster deferral: Deferred  (06/17/2009)    Pneumococcal vaccine: Not documented   Pneumococcal vaccine deferral: Deferred  (06/17/2009)  Colorectal Screening   Hemoccult: Not documented   Hemoccult action/deferral: Deferred  (06/17/2009)    Colonoscopy: Not documented   Colonoscopy action/deferral: Deferred  (06/17/2009)  Other Screening   Pap smear: Not documented   Pap smear action/deferral: Deferred  (06/17/2009)    Mammogram: Not documented   Mammogram action/deferral: Deferred  (06/17/2009)   Smoking status: quit  (08/07/2009)  Diabetes Mellitus   HgbA1C: 8.5  (08/07/2009)   HgbA1C action/deferral: Ordered  (08/07/2009)    Eye exam: Not documented   Diabetic eye exam action/deferral: Deferred  (08/07/2009)    Foot exam: yes  (06/17/2009)   Foot exam action/deferral: Do today   High risk foot: Not documented   Foot care education: Not documented    Urine microalbumin/creatinine ratio: 18.1  (06/17/2009)    Diabetes flowsheet reviewed?: Yes   Progress toward A1C goal: Improved  Lipids   Total Cholesterol: 151  (06/17/2009)   LDL: 84  (06/17/2009)   LDL Direct: Not documented   HDL: 46  (06/17/2009)   Triglycerides: 105  (06/17/2009)  Hypertension   Last Blood Pressure: 137 / 84  (08/07/2009)   Serum creatinine: 0.91  (06/17/2009)   Serum potassium 4.6  (06/17/2009)    Hypertension flowsheet reviewed?: Yes   Progress toward BP goal: At goal   Hypertension comments: Blood pressure rechecked and 106/72 which is below her goal.  Self-Management Support :   Personal Goals (by the next clinic visit) :     Personal A1C goal: 7  (07/01/2009)     Personal blood pressure goal: 130/80  (07/01/2009)     Personal LDL goal: 100  (07/01/2009)    Patient will work on the following items until the next clinic visit to reach self-care goals:  Medications and monitoring: take my medicines every day, check my blood sugar, bring all of my medications to every visit, examine my feet every day  (08/07/2009)     Eating: drink diet soda or water instead of juice or soda, eat more vegetables, use fresh or frozen vegetables, eat foods that are low in salt, eat baked foods instead of fried foods, eat fruit for snacks and desserts, limit or avoid alcohol  (08/07/2009)     Activity: take a 30 minute walk every day, take the stairs instead of the elevator, park at the far end of the parking lot  (07/01/2009)     Other: trying to stay within carb count discussed with CDE - does sit ups for exercise  (08/07/2009)    Diabetes self-management support: Education handout, Resources for patients handout, Written self-care plan  (08/07/2009)   Diabetes care plan printed   Diabetes education handout printed   Last diabetes self-management training by diabetes educator: 07/01/2009    Hypertension self-management support: Education handout, Resources for patients handout, Written self-care plan  (08/07/2009)   Hypertension self-care plan printed.   Hypertension education handout printed    Self-management comments: wearing TED hose on left leg as prescribed - used to be able to do 100 sit ups at one time       Resource handout printed.   Nursing Instructions: HgbA1C today (see order) CBG today (see order)    Laboratory Results   Blood Tests   Date/Time Received: August 07, 2009 1:58 PM Date/Time Reported: Burke Keels  August 07, 2009 1:59 PM   HGBA1C: 8.5%   (Normal Range: Non-Diabetic - 3-6%   Control Diabetic - 6-8%) CBG Random:: 129mg /dL     Appended Document: REASSIGNED NEW TO DR/CFB I have discussed the care of this patient in detail with the resident and agree fully with the documentation completed.

## 2010-02-18 NOTE — Letter (Signed)
Summary: [facility]  [facility]   Imported By: Margie Billet 01/26/2010 15:36:10  _____________________________________________________________________  External Attachment:    Type:   Image     Comment:   External Document

## 2010-02-18 NOTE — Miscellaneous (Signed)
Summary: [facility]  [facility]   Imported By: Margie Billet 12/14/2009 09:24:48  _____________________________________________________________________  External Attachment:    Type:   Image     Comment:   External Document

## 2010-02-18 NOTE — Assessment & Plan Note (Signed)
Summary: EST-1 MONTH RECHECK AND PAP/CH   Vital Signs:  Patient profile:   55 year old female Height:      68.5 inches (173.99 cm) Weight:      222.6 pounds (101.18 kg) BMI:     33.47 Temp:     97.1 degrees F (36.17 degrees C) oral Pulse rate:   88 / minute BP sitting:   131 / 83  (right arm)  Vitals Entered By: Stanton Kidney Ditzler RN (January 22, 2010 3:17 PM) Is Patient Diabetic? Yes Did you bring your meter with you today? No Pain Assessment Patient in pain? no      Nutritional Status BMI of > 30 = obese Nutritional Status Detail appetite fair CBG Result 154  Have you ever been in a relationship where you felt threatened, hurt or afraid?denies   Does patient need assistance? Functional Status Self care Ambulation Wheelchair Comments HFU - doing well and refills on meds. Appt [facility] 01/25/10.   Primary Care Provider:  Leodis Sias MD   History of Present Illness: Patient is a 55 year old female who presents today for a follow up from her hospitalization.  She was admitted from clinic because of an open wound on her left foot.  She underwent I&D of her wound following a MRI which showed osteomyelitis.  She has been doing well since discharge with marked decrease in swelling of her left leg and wound care.  She has an appointment with Wound care to continue treatment of her wound.  She has followed up with Dr. Magnus Ivan so we will try to get his notes from the visit.  She also finished her course of antibiotics yesterday.  During her hospitalization she was noted to be anemic.  An anemia panel showed ferritin of 177, iron of 11, %sat of 6 and a normocytic MCV.  Her workup also consisted of a colonoscopy and EGD which just showed a hiatal hernia and only a fair prep of the colon.  No source of bleeding was found   SHe was also stated on HCTZ during admission to help manage her blood pressue and help with the swelling in her leg.  She will need a bmet done at this visit to  monitor her electrolytes after starting the diuretic.   Depression History:      The patient denies a depressed mood most of the day and a diminished interest in her usual daily activities.        The patient denies that she feels like life is not worth living, denies that she wishes that she were dead, and denies that she has thought about ending her life.         Preventive Screening-Counseling & Management  Alcohol-Tobacco     Alcohol drinks/day: 0     Smoking Status: quit     Year Quit: only smoked when 55 y/o  Caffeine-Diet-Exercise     Does Patient Exercise: yes     Type of exercise: sometmes  EGD  Procedure date:  01/13/2010  Findings:      Widely patent Schatzki's ring with associated diminutive hiatal hernia  Colonoscopy  Procedure date:  01/13/2010  Findings:      Rare pandiverticulosis, fair prep   Current Medications (verified): 1)  Metformin Hcl 1000 Mg Tabs (Metformin Hcl) .... Take One Tablet Daily 2)  Lisinopril 40 Mg Tabs (Lisinopril) .... Take One Tablet By Mouth Daily For Blood Pressure 3)  Multivitamins  Tabs (Multiple Vitamin) .... One Tablet  Once A Day 4)  Hydrochlorothiazide 25 Mg Tabs (Hydrochlorothiazide) .... Take 1 Tablet By Mouth Once A Day  Allergies: 1)  ! Codeine  Past History:  Past medical, surgical, family and social histories (including risk factors) reviewed, and no changes noted (except as noted below).  Past Medical History: Reviewed history from 08/07/2009 and no changes required. Morbid obesity formerly over 400 lbs.  Past Surgical History: Reviewed history from 08/07/2009 and no changes required. Oophectomy bilateral in 1975 Appendectomy  Family History: Reviewed history from 08/07/2009 and no changes required. Mother, MGF had diabetes Father's side has HTN, CAD, MI's Mother: Alzheimer's No hx of Ovarian, breast, or colon cancer  Social History: Reviewed history from 08/07/2009 and no changes required. 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 Smoked when she was a teenager no alcohol no Drugs  Review of Systems  The patient denies anorexia, fever, weight loss, weight gain, vision loss, decreased hearing, hoarseness, chest pain, syncope, dyspnea on exertion, peripheral edema, prolonged cough, headaches, hemoptysis, abdominal pain, melena, hematochezia, severe indigestion/heartburn, hematuria, incontinence, genital sores, muscle weakness, suspicious skin lesions, transient blindness, difficulty walking, depression, unusual weight change, abnormal bleeding, enlarged lymph nodes, angioedema, and breast masses.    Physical Exam  Additional Exam:  General: Well developed, female in no acute distress.  Vital signs reviewed.  Head: Atraumatic, normocephalic with no signs of trauma  Eyes: PERRLA, EOM intact  Ears: TM intact  Nose: Nares patent, mucosa is pink and moist, no polyps noted  Mouth: Mucosa is pink and moist, poor dention,   Neck: Supple, full ROM, no thyromegaly or masses noted  Resp: Clear to ascultation bilaterally, no wheezes, rales, or rhonchi noted  CV: Regular rate and rhythm with no murmurs, rubs, or gallops noted  Abdomen: Soft, non-tender, non-distended with normal bowel sounds  Musculoskelatal: ROM full with intact strength, no pain to palpation, the left lower extremity is markedly more swollen then the right, +2 pitting edema to the hip.  no pain to palpation.  There is a bandage in place over the left foot that is Clean dry and intact.   Neurologic: Alert and oriented x3, Cranial nerves II-XII grossly intact, DTR normal and symmetric  Pulses: Radial, brachial, carotid, femoral, dorsal pedis, and posterior tibial pulses equal and symmetric     Impression & Recommendations:  Problem # 1:  FOOT ULCER, LEFT (ICD-707.10) She is recovering well.  She will be followed at wound care to assist with  the healing of the surgical wound.  She has been afebrile and we will check her WBC count today.  We will plan to follow her course at wound care and see her back in about a month to see how she is doing.    Orders: T-Basic Metabolic Panel [id number]) T-CBC w/Diff [id number])  Problem # 2:  LEG EDEMA (ICD-782.3) Her leg swelling is down quite a bit.  She can't get her compression stocking over the wound on her foot at this time so she is just elevating the leg as much as she can and when the wound is better she can go back to the stocking.  She is tolerating the HCTZ well.   Her updated medication list for this problem includes:    Hydrochlorothiazide 25 Mg Tabs (Hydrochlorothiazide) .Marland Kitchen... Take 1 tablet by mouth once a day  Problem # 3:  DM (ICD-250.00) Her Diabetes is under excellent control.  We will continue  to monitor.    Her updated medication list for this problem includes:    Metformin Hcl 1000 Mg Tabs (Metformin hcl) .Marland Kitchen... Take one tablet daily    Lisinopril 40 Mg Tabs (Lisinopril) .Marland Kitchen... Take one tablet by mouth daily for blood pressure  Orders: T-Basic Metabolic Panel [id number]) T-CBC w/Diff [id number])  Labs Reviewed: Creat: 0.84 (12/18/2009)     Last Eye Exam: normal (12/29/2009) Reviewed HgBA1c results: 5.7 (12/17/2009)  6.1 (10/08/2009)  Problem # 4:  ANEMIA, NORMOCYTIC (ICD-285.9) Her ferritin is higher but since it is an acute phase reactant it could be falsely elevated.  Her iron level and %sat are very low.  We will start her on iron and at her next visit check a MMA level.  She has not signs of active bleeding but she will need a repeat colonoscopy in 5 years.  Her updated medication list for this problem includes:    Ferrous Sulfate 325 (65 Fe) Mg Tabs (Ferrous sulfate) .Marland Kitchen... Take 1 tablet by mouth once a day with orange juice.  Orders: T-Basic Metabolic Panel [id number]) T-CBC w/Diff [id number])  TSH: 1.462 (06/17/2009)  Complete Medication  List: 1)  Metformin Hcl 1000 Mg Tabs (Metformin hcl) .... Take one tablet daily 2)  Lisinopril 40 Mg Tabs (Lisinopril) .... Take one tablet by mouth daily for blood pressure 3)  Multivitamins Tabs (Multiple vitamin) .... One tablet once a day 4)  Hydrochlorothiazide 25 Mg Tabs (Hydrochlorothiazide) .... Take 1 tablet by mouth once a day 5)  Ferrous Sulfate 325 (65 Fe) Mg Tabs (Ferrous sulfate) .... Take 1 tablet by mouth once a day with orange juice.  Other Orders: Capillary Blood Glucose/CBG ([id number])  Patient Instructions: 1)  Continue taking the HCTZ.   2)  Stop in the lab for your tests.  If we need to adjust anything I will call you. 3)  Start Ferrous Sulfate 325 mg by mouth daily. 4)  Keep your Wound care appointments. 5)  Follow up in 1 month. Prescriptions: FERROUS SULFATE 325 (65 FE) MG TABS (FERROUS SULFATE) Take 1 tablet by mouth once a day with orange juice.  #30 x 1   Entered and Authorized by:   Leodis Sias MD   Signed by:   Leodis Sias MD on 01/22/2010   Method used:   Electronically to        [facility]* (retail)       [address]       [place], [place]  [address]       Ph: [phone]       Fax: [phone]   RxID:   [id number] HYDROCHLOROTHIAZIDE 25 MG TABS (HYDROCHLOROTHIAZIDE) Take 1 tablet by mouth once a day  #30 x 3   Entered and Authorized by:   Leodis Sias MD   Signed by:   Leodis Sias MD on 01/22/2010   Method used:   Electronically to        [facility]* (retail)       [address]       [place], [place]  [address]       Ph: [phone]       Fax: [phone]   RxID:   [id number]    Orders Added: 1)  Capillary Blood Glucose/CBG [82948] 2)  T-Basic Metabolic Panel [[id number]] 3)  T-CBC w/Diff [[id number]] 4)  Est. Patient Level III [81829]   Process Orders Check Orders Results:     [facility]: ABN not required for this insurance Tests Sent  for requisitioning (January 24, 2010 8:49 AM):     01/22/2010: [facility] -- T-Basic Metabolic Panel [id number] (signed)     01/22/2010: [facility] -- T-CBC w/Diff [[id number]] (signed)     Prevention & Chronic Care Immunizations   Influenza vaccine: Fluvax Non-MCR  (09/24/2009)   Influenza vaccine deferral: Deferred  (06/17/2009)   Influenza vaccine due: 09/18/2010    Tetanus booster: 12/29/2009: Tdap   Td booster deferral: Refused  (12/17/2009)   Tetanus booster due: 12/30/2019    Pneumococcal vaccine: Pneumovax  (12/29/2009)   Pneumococcal vaccine deferral: Refused  (12/17/2009)   Pneumococcal vaccine due: 10/31/2020  Colorectal Screening   Hemoccult: Not documented   Hemoccult action/deferral: Ordered  (11/30/2009)    Colonoscopy: Rare pandiverticulosis, fair prep   (01/13/2010)   Colonoscopy action/deferral: GI referral  (12/29/2009)  Other Screening   Pap smear: Not documented   Pap smear action/deferral: Deferred  (10/08/2009)    Mammogram: ASSESSMENT: Negative - BI-RADS 1^MS DIGITAL SCREENING  (10/06/2009)   Mammogram action/deferral: Deferred  (10/08/2009)   Mammogram due: 10/07/2011   Smoking status: quit  (01/22/2010)  Diabetes Mellitus   HgbA1C: 5.7  (12/17/2009)   HgbA1C action/deferral: Ordered  (08/07/2009)   Hemoglobin A1C due: 12/18/2010    Eye exam: normal  (12/29/2009)   Diabetic eye exam action/deferral: Ophthalmology referral  (11/30/2009)   Eye exam due: 12/2010    Foot exam: yes  (12/29/2009)   Foot exam action/deferral: Do today   High risk foot: Yes  (12/29/2009)   Foot care education: Done  (12/29/2009)   Foot exam due: 12/30/2010    Urine microalbumin/creatinine ratio: 18.1  (06/17/2009)   Urine microalbumin/cr due: 06/18/2010    Diabetes flowsheet reviewed?: Yes   Progress toward A1C goal: At goal  Lipids   Total Cholesterol: 151  (06/17/2009)   LDL: 84  (06/17/2009)   LDL Direct: Not  documented   HDL: 46  (06/17/2009)   Triglycerides: 105  (06/17/2009)   Lipid panel due: 06/18/2010  Hypertension   Last Blood Pressure: 131 / 83  (01/22/2010)   Serum creatinine: 0.84  (12/18/2009)   Serum potassium 4.0  (12/18/2009)    Hypertension flowsheet reviewed?: Yes   Progress toward BP goal: Improved  Self-Management Support :   Personal Goals (by the next clinic visit) :     Personal A1C goal: 7  (07/01/2009)     Personal blood pressure goal: 130/80  (07/01/2009)     Personal LDL goal: 100  (07/01/2009)    Patient will work on the following items until the next clinic visit to reach self-care goals:     Medications and monitoring: take my medicines every day, check my blood sugar, check my blood pressure, bring all of my medications to every visit, examine my feet every day  (01/22/2010)     Eating: drink diet soda or water instead of juice or soda, eat more vegetables, use fresh or frozen vegetables, eat foods that are low in salt, eat baked foods instead of fried foods, eat fruit for snacks and desserts, limit or avoid alcohol  (01/22/2010)     Activity: take a 30 minute walk every day  (11/30/2009)     Other: trying to stay within carb count discussed with CDE - does sit ups for exercise  (08/07/2009)    Diabetes self-management support: Written self-care plan, Education handout, Resources for patients handout  (01/22/2010)   Diabetes care plan printed   Diabetes education handout printed  Last diabetes self-management training by diabetes educator: 07/01/2009    Hypertension self-management support: Written self-care plan, Education handout, Resources for patients handout  (01/22/2010)   Hypertension self-care plan printed.   Hypertension education handout printed      Resource handout printed.   Last LDL:                                                 84 (06/17/2009 8:12:00 PM)      Diabetic Foot Exam Comments: Foot exam not done today - appt Wound Center  01/25/10 for foot, recently discharge from [facility] and in w/c. Debra Ditzler RN  Appended Document: EST-1 MONTH RECHECK AND PAP/CH Dr Tonny Branch and I discsussed Ms Khader. On review of her inpt records, her Vit B 12 level was just a few points above the lower cut off level. Dr Tonny Branch has documented the possible etiologies of her anemia, but B12 def still is possible so a MMA is indicated to assess for B12 def.

## 2010-02-18 NOTE — Miscellaneous (Signed)
  Clinical Lists Changes  Orders: Added new Service order of Est. Patient Level IV (99214) - Signed 

## 2010-02-18 NOTE — Letter (Signed)
Summary: BLOOD SUGAR  BLOOD SUGAR   Imported By: Margie Billet 06/18/2009 12:24:52  _____________________________________________________________________  External Attachment:    Type:   Image     Comment:   External Document

## 2010-02-18 NOTE — Initial Assessments (Signed)
INTERNAL MEDICINE ADMISSION HISTORY AND PHYSICAL Admission Date: 01/07/10 Attending Physician: Dr. Tilford Pillar First Contact: Dr Allena Katz [phone] Second contact: Dr Scot Dock [phone]  Weekends, [place], or after 5pm Weekdays:  1st Contact: [phone] 2nd Contact: [phone]   PCP:Dr Pribula  CC: Ms. Mcenery is a  55 year old woman with pmh significant for DM and HTN who presents to the clinic for worsening blister and swelling of left foot. She states it started out as a blister/callus. Over the past couple of days, it had popped open. It is located on the heel of the left foot and extends to mid-plantar region. She states it has been draining, with some occasional blood. She said the odor has become quite foul over the past 2 days. She has not taken her temperature, but says she has been feeling warmer than usual and noticed some chills. She mentioned that she had some buring sensation with urination earlier this week but it improved.  She denies chest pain, abdominal pain, changes in bowel habits. She denies having any significant pain in that area, but notes, she lacks a lot of sensation in her feet due to diabetes. She denies recent trauma to foot. She does very little walking, and has been wearing white compression stocking as directed. She has also tried her best to keep her legs elevated.   Of note: The patient has had left leg and foot swelling since August, which she initially thought was due to an injury. Over the past few months, the edema has persisted and she has undergone vascular studies and was seen at Dr. Candie Chroman office 12/28/2009. She had doppler studies performed  which showed severe reflux in the deep system throughout. In the context of his note, he outlines that patient may have had DVT in the past, which cause valvular incompetence. Her venous duplex study was negative that day. She had no open draining ulcers, and thus was advised to wear ted-hose stockings, keep her legs  elevated, and diuretic as needed.   CT abd/pelvis was done to rule otu any lymphatic obstruction causing left leg edema, which was negative.   ALLERGIES:  ! CODEINE  PAST MEDICAL HISTORY: Diabetes, Type II, HgbA1c 5.7 Diagnoses in 05/2009 HTN Obesity  MEDICATIONS:  METFORMIN HCL 1000 MG TABS (METFORMIN HCL) take one tablet daily LISINOPRIL 40 MG TABS (LISINOPRIL) Take one tablet by mouth daily for Blood pressure MULTIVITAMINS  TABS (MULTIPLE VITAMIN) one tablet once a day   SOCIAL HISTORY: Patient lives in [place]. 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 Has BA in behavioral science. She is single has  no kids.Smoked when she was a teenager Denies any  alcohol or illicit  drug use.   FAMILY HISTORY Mother, MGF had diabetes Father's side has HTN, CAD, MI's Mother: Alzheimer's No hx of Ovarian, breast, or colon cancer  NGE:[id number], all negative except the positive finding per HPI.  VITALS: T:99  P:92  BP:144/78  R: 20 O2SAT:98%  ON:RA orhtostatic BP: lying: BP 122/74 P 91, sitting 125/79 P 87, standing 104/70 P 101 PHYSICAL EXAM: Gen: Patient is in NAD, Pleasant. Eyes: PERRL, EOMI, No signs of anemia or jaundince. ENT: MMM, OP clear, No erythema, thrush or exudates. Neck: Supple, No carotid Bruits, No JVD, No thyromegaly Resp: CTA- Bilaterally, No W/C/R. CVS: S1S2 RRR, No M/R/G GI: Abdomen is soft. ND, NT, NG, NR, BS+. No organomegaly.  Rectal vault has stool, normal , no visible blood, Guaics ngetaive. Ext: Left  lower extr. edema, extending from thigh down to the feet, most edema around the ankles, warm to touch, open plantar lesion on first metatarsal, with involving full skin thickness, purulent yellow discharge, foul odor. Right lower extr. normal with no edema or no ulcers or lesion. DP pulse in R extremity absence of pulses in left lower extremity GU: No CVA tenderness. MS: Moving all 4 extremities. Neuro: A&O X3,  CN II - XII are grossly intact. Motor strength is 5/5 in the all 4 extremities, Sensations intact to light touch, Gait normal, Cerebellar signs negative. Psych: Appropriate  LABS:  WBC                                      12.4       h      4.0-10.5         K/uL  RBC                                      2.87       l      3.87-5.11        MIL/uL  Hemoglobin (HGB)                         7.9        l      12.0-15.0        g/dL  Hematocrit (HCT)                         23.8       l      36.0-46.0        %  MCV                                      82.9              78.0-100.0       fL  MCH -                                    27.5              26.0-34.0        pg  MCHC                                     33.2              30.0-36.0        g/dL  RDW                                      15.0              11.5-15.5        %  Platelet Count (PLT)                     250  150-400          K/uL  Sodium (NA)                              139               135-145          mEq/L  Potassium (K)                            4.1               3.5-5.1          mEq/L  Chloride                                 106               96-112           mEq/L  CO2                                      27                19-32            mEq/L  Glucose                                  101        h      70-99            mg/dL  BUN                                      9                 6-23             mg/dL  Creatinine                               0.91              0.4-1.2          mg/dL  GFR, Est Non African American            >60               >60              mL/min  GFR, Est African American                >60               >60              mL/min    Oversized comment, see footnote  1  Calcium                                  8.8               8.4-10.5  Protime ( Prothrombin Time)              15.3       h      11.6-15.2        seconds  INR                                      1.19               0.00-1.49   PTT(a-Partial Thromboplastn Time)        36                24-37         Color, Urine                             YELLOW            YELLOW  Appearance                               CLEAR             CLEAR  Specific Gravity                         1.005             1.005-1.030  pH                                       6.5               5.0-8.0  Urine Glucose                            NEGATIVE          NEG              mg/dL  Bilirubin                                NEGATIVE          NEG  Ketones                                  NEGATIVE          NEG              mg/dL  Blood                                    SMALL      a      NEG  Protein                                  NEGATIVE          NEG              mg/dL  Urobilinogen  0.2               0.0-1.0          mg/dL  Nitrite                                  NEGATIVE          NEG  Leukocytes                               TRACE      a      NEG   WBC / HPF                                3-6               <3               WBC/hpf  RBC / HPF                                3-6               <3               RBC/hpf  Bacteria / HPF                           RARE              RARE  Urine-Other                              RARE YEAST     ASSESSMENT AND PLAN:  1. Foot ulcer. left  Based on appearance of ulcer, it appears as a Grade 2 diabetic ulcer in the sense it penetrates to ligaments and muscles, involving full skin thickness. However, it is difficult to discern whether there is any underlying bone infection. With foul odor, purulent discharge, and subjective fevers, will admit patient for further evaluation to rule out osteo.   Plan: -Admit to regular bed -MRI of left leg r/o osteomyelitis  -Start broad spectrum antibiotics (Vanc and Ceftaz to cover Staph and Pseudomonas) -Wound care consult regarding further recommendations on debridement -Repeat venous doppler - Consider to consult Ortho after  MRI evaluation for possible further intervention  2. Anemia, unclear ethiology. Pt Hgb in 05/2009 12.2, pt is orthostatic, FOBT neg. x1 - Will check FOBT, Anemia panel - Type and screened 2 PRBC - H/H q 6 hrs  2. Hypertension: Holding Lisinpril   3. Diabetes, Type II, Last Hgb A1c 5.7 Holding Metformin. Started on SSI  4. DVT ppx: Lovenox   Attending Physician:  I have seen and examined the patient. I reviewed the resident note and agree with the findings and plan of care as documented. My additions and revisions are included.    Signature:___________________________________________

## 2010-02-18 NOTE — Assessment & Plan Note (Signed)
Summary: ACUTE-BLISTERS ON TOE(PRIBULA)/CFB   Vital Signs:  Patient profile:   55 year old female Height:      68.5 inches (173.99 cm) Weight:      226.7 pounds (103.05 kg) BMI:     34.09 Temp:     98.0 degrees F Pulse rate:   85 / minute BP sitting:   169 / 90  (right arm) Cuff size:   large  Vitals Entered By: Dorie Rank RN (October 28, 2009 1:57 PM)  Serial Vital Signs/Assessments:  Time      Position  BP       Pulse  Resp  Temp     By 2:02 PM   L Arm     150/93                         Dorie Rank RN  CC: need to look at a "couple of my toes" - thinks she bumped into table with socks only on last week - noted toenail came off 2nd right toe, then skin peeled back from 3rd toenail and 4th toe has darkened skin appearance,as if breakdown imminent Is Patient Diabetic? Yes Did you bring your meter with you today? No Nutritional Status BMI of > 30 = obese CBG Result 128  Does patient need assistance? Functional Status Self care Ambulation Normal   Primary Care Perina Salvaggio:  Leodis Sias MD  CC:  need to look at a "couple of my toes" - thinks she bumped into table with socks only on last week - noted toenail came off 2nd right toe, then skin peeled back from 3rd toenail and 4th toe has darkened skin appearance, and as if breakdown imminent.  History of Present Illness: 55 y/o W with HTN, DM, chronic venous insuff comes to the clinic complining of color change in toes of right foot  - she hit her right foot last week to a table - no pain, swellinh - the 2,3 and 4 th toes started getting darker next day - has an open wound on 3rd toe that has been draining little pus from the wound that has now been just watery - no fever,, chills ,oter wounds etc.  - has been using topical silver sulfadiazine over the counter since  a week  Preventive Screening-Counseling & Management  Alcohol-Tobacco     Smoking Status: quit     Year Quit: only smoked when 55 y/o  Current  Medications (verified): 1)  Metformin Hcl 1000 Mg Tabs (Metformin Hcl) .... Take 1 Pill By Mouth Two Times A Day. 2)  Lisinopril 20 Mg Tabs (Lisinopril) .... Take 1 Pill By Mouth Daily. 3)  Multivitamins  Tabs (Multiple Vitamin) .... One Tablet Once A Day  Allergies (verified): 1)  ! Codeine  Review of Systems  The patient denies anorexia, fever, weight loss, weight gain, vision loss, decreased hearing, hoarseness, chest pain, syncope, dyspnea on exertion, peripheral edema, prolonged cough, headaches, hemoptysis, abdominal pain, melena, hematochezia, severe indigestion/heartburn, hematuria, incontinence, genital sores, muscle weakness, suspicious skin lesions, transient blindness, difficulty walking, depression, unusual weight change, abnormal bleeding, enlarged lymph nodes, angioedema, breast masses, and testicular masses.    Physical Exam  General:  General: Alert, well developed and in no acurte distress ENT: mucous membranes pink & moist. No abnormal finds in ear and nose. CVC:S1 S2 , no murmurs, no abnormal heart sounds. Lungs: Clear to auscultation B/L. No wheezes, crackles or other abnormal sounds Abdomen: soft, non distended, no  tender. Normal Bowel sounds Pulsations normal  Neuro:alert, oriented *3, cranial nerved 2-12 intact, strenght normal in all  extremities, senstations normal to light touch.   Extremities:  right foot- open wound with overlying dead tissue on the 3rd toe  and some loos skin on the 2nd toe as well good pulsation in the foot dark colored toes unclipped nails no sings of local infection edema in the left leg   Impression & Recommendations:  Problem # 1:  OPEN WOUND TOE WITHOUT MENTION COMPLICATION (ICD-893.0)  Open wound, uncontrolled DM in past Good blood circulation, no sing of infection  plan -wound care consult- patinet will be seen on 10/19 - dressing- silver sulfadiazine on calcium algenate and dy gauze wrap.until then - RTC/ED if fever,  chills, redness, pus drainage or other signs of infection  Orders: Wound Care Center Referral (Wound Care)  Complete Medication List: 1)  Metformin Hcl 1000 Mg Tabs (Metformin hcl) .... Take 1 pill by mouth two times a day. 2)  Lisinopril 20 Mg Tabs (Lisinopril) .... Take 1 pill by mouth daily. 3)  Multivitamins Tabs (Multiple vitamin) .... One tablet once a day  Other Orders: Capillary Blood Glucose/CBG (56213)  Patient Instructions: 1)  Please schedule a follow-up appointment in 1 month.   Prevention & Chronic Care Immunizations   Influenza vaccine: Fluvax Non-MCR  (09/24/2009)   Influenza vaccine deferral: Deferred  (06/17/2009)    Tetanus booster: Not documented   Td booster deferral: Deferred  (06/17/2009)    Pneumococcal vaccine: Not documented   Pneumococcal vaccine deferral: Deferred  (06/17/2009)  Colorectal Screening   Hemoccult: Not documented   Hemoccult action/deferral: Deferred  (06/17/2009)    Colonoscopy: Not documented   Colonoscopy action/deferral: Deferred  (06/17/2009)  Other Screening   Pap smear: Not documented   Pap smear action/deferral: Deferred  (10/08/2009)    Mammogram: ASSESSMENT: Negative - BI-RADS 1^MS DIGITAL SCREENING  (10/06/2009)   Mammogram action/deferral: Deferred  (10/08/2009)   Smoking status: quit  (10/28/2009)  Diabetes Mellitus   HgbA1C: 6.1  (10/08/2009)   HgbA1C action/deferral: Ordered  (08/07/2009)    Eye exam: Not documented   Diabetic eye exam action/deferral: Deferred  (08/07/2009)    Foot exam: yes  (09/24/2009)   Foot exam action/deferral: Do today   High risk foot: Yes  (09/24/2009)   Foot care education: Done  (09/24/2009)    Urine microalbumin/creatinine ratio: 18.1  (06/17/2009)  Lipids   Total Cholesterol: 151  (06/17/2009)   LDL: 84  (06/17/2009)   LDL Direct: Not documented   HDL: 46  (06/17/2009)   Triglycerides: 105  (06/17/2009)  Hypertension   Last Blood Pressure: 169 / 90  (10/28/2009)    Serum creatinine: 0.91  (06/17/2009)   Serum potassium 4.6  (06/17/2009)  Self-Management Support :   Personal Goals (by the next clinic visit) :     Personal A1C goal: 7  (07/01/2009)     Personal blood pressure goal: 130/80  (07/01/2009)     Personal LDL goal: 100  (07/01/2009)    Patient will work on the following items until the next clinic visit to reach self-care goals:     Medications and monitoring: take my medicines every day, check my blood sugar, bring all of my medications to every visit, examine my feet every day  (10/28/2009)     Eating: drink diet soda or water instead of juice or soda, eat more vegetables, eat foods that are low in salt, eat baked foods instead of fried  foods, eat fruit for snacks and desserts  (10/28/2009)     Activity: join a walking program  (10/28/2009)     Other: trying to stay within carb count discussed with CDE - does sit ups for exercise  (08/07/2009)    Diabetes self-management support: Pre-printed educational material, Resources for patients handout, Written self-care plan  (10/28/2009)   Diabetes care plan printed   Last diabetes self-management training by diabetes educator: 07/01/2009    Hypertension self-management support: Pre-printed educational material, Resources for patients handout, Written self-care plan  (10/28/2009)   Hypertension self-care plan printed.      Resource handout printed.   Appended Document: ACUTE-BLISTERS ON TOE(PRIBULA)/CFB dressing to right 2nd,3rd, 4th toes per Clydie Braun P at wound care center - silver sulfadene applied to calcium alginate dressing and covered all injured tissue on those 3 toes, folded 2x2 gauze between toes to separate, conform stretch gauze used to anchor dressing and wrap around 3 toes. Pt given instructions to keep dressing dry and in tact and wash toes with soap and water and redress as directed on Sunday. Pt given wound care center contact info to call if toes condition worsens before appt on Oct.  19 - Patient verbalizes understanding of these instructions. Dorie Rank, RN, 10/28/2009, 3:30 P

## 2010-02-18 NOTE — Letter (Signed)
Summary: DIABETES-METER DOWNLOAD 06/18-07/22/2011  DIABETES-METER DOWNLOAD 06/18-07/22/2011   Imported By: Shon Hough 08/12/2009 12:29:11  _____________________________________________________________________  External Attachment:    Type:   Image     Comment:   External Document

## 2010-02-18 NOTE — Progress Notes (Signed)
Summary: leg/ hla  Phone Note Call from Patient   Summary of Call: pt calls for what she states is ongoing x appr 5months, pain and swelling to left leg, she has tried resting and elevation of leg and this does help but when she must get up the leg gets worse, she is scheduled for appt Initial call taken by: Marin Roberts RN,  October 07, 2009 5:48 PM

## 2010-02-18 NOTE — Consult Note (Signed)
Summary: VASCULAR&VEIN   VASCULAR&VEIN   Imported By: Margie Billet 01/12/2010 15:04:43  _____________________________________________________________________  External Attachment:    Type:   Image     Comment:   External Document

## 2010-02-18 NOTE — Progress Notes (Signed)
Summary: results and blister/ hla  Phone Note Call from Patient   Summary of Call: called for results and also to say blister on foot is getting bigger and is painful, no appts available, she will call in am to ask about cancelled appts Initial call taken by: Marin Roberts RN,  January 06, 2010 2:24 PM  Follow-up for Phone Call        It appears that patient has been scheduled for tomorrow afternoon with Dr. [name]. Follow-up by: Margarito Liner MD,  January 06, 2010 3:11 PM

## 2010-02-18 NOTE — Assessment & Plan Note (Signed)
Summary: 2 WEEK RECHECK/CH   Vital Signs:  Patient profile:   55 year old female Height:      70.25 inches (178.44 cm) Weight:      235.7 pounds (107.14 kg) BMI:     33.70 Temp:     97.3 degrees F (36.28 degrees C) oral Pulse rate:   93 / minute BP sitting:   141 / 88  (right arm)  Vitals Entered By: Stanton Kidney Ditzler RN (July 01, 2009 4:09 PM) Is Patient Diabetic? Yes Did you bring your meter with you today? Yes Pain Assessment Patient in pain? no      Nutritional Status BMI of > 30 = obese Nutritional Status Detail appetite ok  Have you ever been in a relationship where you felt threatened, hurt or afraid?denies   Does patient need assistance? Functional Status Self care Ambulation Normal Comments FU - doing well.   History of Present Illness: [name] comes for a f/u visit.   1. DM: She is taking her glimeperide and metformin regularly. She is checking her CBG 4 times a day and it runs from 60 to 132 when looking at her readings for a week. She had 3 readings in 60's and one of 75. She had some dizziness and probably going to pass out when she had these symptoms. She also had some sickness as well and she also had some sinus drainage at that time. She drank orange juice and she actually got more sleepier. She is eating very strictly and she feels like she is not eating enough.She is also very active and she walks for about 30 minutes a day for 5 times a week.   2. Cellulitis: She finished her antibiotics and she still has swelling on her left leg. No pain. No CP, but she has SOB once when she was in direct sunlight.   3. HTN: She is taking her lisinopril regularly without any problems.   Depression History:      The patient denies a depressed mood most of the day and a diminished interest in her usual daily activities.         Preventive Screening-Counseling & Management  Alcohol-Tobacco     Smoking Status: quit  Current Medications (verified): 1)  Glimepiride 4 Mg  Tabs (Glimepiride) .... Take 1 Pill By Mouth Daily. 2)  Metformin Hcl 1000 Mg Tabs (Metformin Hcl) .... Take 1 Pill By Mouth Two Times A Day. 3)  Lisinopril 20 Mg Tabs (Lisinopril) .... Take 1 Pill By Mouth Daily.  Allergies: 1)  ! Codeine  Review of Systems      See HPI  Physical Exam  Mouth:  pharynx pink and moist.   Lungs:  normal breath sounds, no crackles, and no wheezes.   Heart:  normal rate, regular rhythm, no murmur, and no gallop.   Extremities:  There is 3 plus left sided leg edema without calf tenderness. trace right pedal edema.   Neurologic:  alert & oriented X3.     Impression & Recommendations:  Problem # 1:  CELLULITIS (ICD-682.9) There is no redness or increased temperature on the left leg, but there is remarkable leg swelling. She had a doppler of her leg at the end of May, when she went to ED and was diagnosed with cellulits and the doppler was negative for DVT. So I doubt this is DVT. The swelling is probably a remnant of infection, which will resolve with time or it is a result of venous incomptence of undiagnosed  remote DVT of the left leg. Will provide her one ted hose today and encouraged to elevate her legs. Will f/u in a month and if the concern for DVT is high on f/u will repeat doppler.   The following medications were removed from the medication list:    Bactrim Ds 800-160 Mg Tabs (Sulfamethoxazole-trimethoprim) .Marland Kitchen... Take 1 pill by mouth two times a day.  Problem # 2:  HYPERTENSION (ICD-401.9) No significant change in  BP, will increase the dose of lisinopril to 20. Check basic chemistry in f/u visit.  Her updated medication list for this problem includes:    Lisinopril 20 Mg Tabs (Lisinopril) .Marland Kitchen... Take 1 pill by mouth daily.  BP today: 141/88 Prior BP: 145/87 (06/17/2009)  Labs Reviewed: K+: 4.6 (06/17/2009) Creat: : 0.91 (06/17/2009)   Chol: 151 (06/17/2009)   HDL: 46 (06/17/2009)   LDL: 84 (06/17/2009)   TG: 105 (06/17/2009)  Problem # 3:  DM  (ICD-250.00) See HPI. Her 1 week of home CBGs are excellent with 3 readings in 60 with some hypoglycemic symptoms. She has severely cut back on her food amount, in addition to checking the type of food she takes and has also become active, although she gained 3 lbs. Plan is to keep the same treatment for diabetes, but encouraged her to eat regular normal amount of food, but watch the type of food and to slowly advance her physical activity. If she has persistent low readings and has symptoms, she will report to our clinic and we will change her regimen. She does not have any kind of insurance and will see [facility] and after she has [facility] card, will refer her to optho.  Her updated medication list for this problem includes:    Glimepiride 4 Mg Tabs (Glimepiride) .Marland Kitchen... Take 1 pill by mouth daily.    Metformin Hcl 1000 Mg Tabs (Metformin hcl) .Marland Kitchen... Take 1 pill by mouth two times a day.    Lisinopril 20 Mg Tabs (Lisinopril) .Marland Kitchen... Take 1 pill by mouth daily.  Complete Medication List: 1)  Glimepiride 4 Mg Tabs (Glimepiride) .... Take 1 pill by mouth daily. 2)  Metformin Hcl 1000 Mg Tabs (Metformin hcl) .... Take 1 pill by mouth two times a day. 3)  Lisinopril 20 Mg Tabs (Lisinopril) .... Take 1 pill by mouth daily.  Patient Instructions: 1)  Please schedule a follow-up appointment in 1 month. 2)  Limit your Sodium (Salt) to less than 2 grams a day(slightly less than 1/2 a teaspoon) to prevent fluid retention, swelling, or worsening of symptoms. 3)  It is important that you exercise regularly at least 20 minutes 5 times a week. If you develop chest pain, have severe difficulty breathing, or feel very tired , stop exercising immediately and seek medical attention. 4)  You need to lose weight. Consider a lower calorie diet and regular exercise.  5)  Check your blood sugars regularly. If your readings are usually above : or below 70 you should contact our office. 6)  It is important that your Diabetic A1c  level is checked every 3 months. 7)  See your eye doctor yearly to check for diabetic eye damage. 8)  Check your feet each night for sore areas, calluses or signs of infection. 9)  Check your Blood Pressure regularly. If it is above: you should make an appointment. Prescriptions: LISINOPRIL 20 MG TABS (LISINOPRIL) take 1 pill by mouth daily.  #30 x 0   Entered and Authorized by:  Jason Coop MD   Signed by:   Jason Coop MD on 07/01/2009   Method used:   Print then Give to Patient   RxID:   [id number]    Prevention & Chronic Care Immunizations   Influenza vaccine: Not documented   Influenza vaccine deferral: Deferred  (06/17/2009)    Tetanus booster: Not documented   Td booster deferral: Deferred  (06/17/2009)    Pneumococcal vaccine: Not documented   Pneumococcal vaccine deferral: Deferred  (06/17/2009)  Colorectal Screening   Hemoccult: Not documented   Hemoccult action/deferral: Deferred  (06/17/2009)    Colonoscopy: Not documented   Colonoscopy action/deferral: Deferred  (06/17/2009)  Other Screening   Pap smear: Not documented   Pap smear action/deferral: Deferred  (06/17/2009)    Mammogram: Not documented   Mammogram action/deferral: Deferred  (06/17/2009)   Smoking status: quit  (07/01/2009)  Diabetes Mellitus   HgbA1C: Not documented   HgbA1C action/deferral: Deferred  (06/17/2009)    Eye exam: Not documented    Foot exam: yes  (06/17/2009)   Foot exam action/deferral: Do today   High risk foot: Not documented   Foot care education: Not documented    Urine microalbumin/creatinine ratio: 18.1  (06/17/2009)  Lipids   Total Cholesterol: 151  (06/17/2009)   LDL: 84  (06/17/2009)   LDL Direct: Not documented   HDL: 46  (06/17/2009)   Triglycerides: 105  (06/17/2009)  Hypertension   Last Blood Pressure: 141 / 88  (07/01/2009)   Serum creatinine: 0.91  (06/17/2009)   Serum potassium 4.6  (06/17/2009)  Self-Management Support :    Personal Goals (by the next clinic visit) :     Personal A1C goal: 7  (07/01/2009)     Personal blood pressure goal: 130/80  (07/01/2009)     Personal LDL goal: 100  (07/01/2009)    Patient will work on the following items until the next clinic visit to reach self-care goals:     Medications and monitoring: take my medicines every day, check my blood sugar, bring all of my medications to every visit, examine my feet every day  (07/01/2009)     Eating: drink diet soda or water instead of juice or soda, eat more vegetables, use fresh or frozen vegetables, eat foods that are low in salt, eat baked foods instead of fried foods, eat fruit for snacks and desserts, limit or avoid alcohol  (07/01/2009)     Activity: take a 30 minute walk every day, take the stairs instead of the elevator, park at the far end of the parking lot  (07/01/2009)    Diabetes self-management support: Written self-care plan, Education handout, Resources for patients handout  (07/01/2009)   Diabetes care plan printed   Diabetes education handout printed    Hypertension self-management support: Written self-care plan, Education handout, Resources for patients handout  (07/01/2009)   Hypertension self-care plan printed.   Hypertension education handout printed      Resource handout printed.   Appended Document: 2 WEEK RECHECK/CH TED knee- hi stockings mailed to pt per Dr Aleene Davidson. Calf is 17" and length is 20".

## 2010-02-18 NOTE — Consult Note (Signed)
Summary: [facility]  [facility]   Imported By: Louretta Parma 11/18/2009 15:17:06  _____________________________________________________________________  External Attachment:    Type:   Image     Comment:   External Document

## 2010-02-18 NOTE — Assessment & Plan Note (Signed)
Summary: ACUTE/PRIBULA/2 WEEK RECHECK PER ILLATH/CH   Vital Signs:  Patient profile:   55 year old female Height:      68.5 inches Weight:      231.4 pounds BMI:     34.80 Temp:     97.3 degrees F oral Pulse rate:   88 / minute BP sitting:   140 / 81  (right arm)  Vitals Entered By: Filomena Jungling NT II (October 08, 2009 10:41 AM) CC:  FOLLOWUPVISIT /  NEED REFILLS Is Patient Diabetic? Yes Did you bring your meter with you today? Yes Nutritional Status BMI of > 30 = obese CBG Result 137  Have you ever been in a relationship where you felt threatened, hurt or afraid?No   Does patient need assistance? Functional Status Self care Ambulation Normal   Primary Care Jahziel Sinn:  Leodis Sias MD  CC:   FOLLOWUPVISIT /  NEED REFILLS.  History of Present Illness: This is 55 year old female with PMH of DM and Hypertension who presents to the office for follow up of pain and swelling in her left leg.  1. Left Foot, swelling, painful, redness, with no open lesions at last clinic visit - was treated with doxy and compression stockings. Dopplers were neg for DVT.   Today pt states some swelling persists, but it is no longer tender. She keeps them elevated and the swelling tends to decrease.   2. DM on Metformin 1000 mg two times a day with HgA1c in 7/22 8.7.   Exercises regular and changed her lifestyle completely.       Preventive Screening-Counseling & Management  Alcohol-Tobacco     Smoking Status: quit     Year Quit: only smoked when 55 y/o  Allergies: 1)  ! Codeine  Past History:  Past Medical History: Last updated: 08/07/2009 Morbid obesity formerly over 400 lbs.  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 Smoked when she was a teenager no alcohol no Drugs  Risk Factors: Smoking Status: quit (10/08/2009)  Review of Systems General:  Denies loss of appetite, weakness, and weight loss. CV:  Denies chest pain or discomfort. GI:  Denies abdominal pain, change in bowel habits, nausea, and vomiting.  Physical Exam  General:  alert and well-developed.  VS reviewed Head:  normocephalic and atraumatic.   Neck:  supple.   Lungs:  normal respiratory effort and normal breath sounds.   Heart:  normal rate and regular rhythm.   Abdomen:  soft and non-tender.   Extremities:  LLE edema, nonpitting no warmth nontender no erythema   Impression & Recommendations:  Problem # 1:  LEG EDEMA (ICD-782.3) Assessment Improved Likely due to chronic venous insufficiency. It started off as cellulitis and was treated with abx accordingly, however it appears that infection has resolved, yet the swelling persists. It is unilateral, and worse when standing and better with leg elevation. Pt is compliant with compression stockings and leg elevation. Advised patient to continue with what she is doing. As mentioned in last visit, pt is negative for DVT. Will follow closely.   Problem # 2:  DM (ICD-250.00) Assessment: Improved DM has improved as evident with A1C. Will continue  current regimen. Pt has made major lifestyle changes, which has contributed to improvement. Will continue current regimen, and reassess in 3 months. If pt continues to do well, we can reduce metformin vs stoping medication.   Her updated medication list for this problem includes:    Metformin Hcl 1000 Mg Tabs (Metformin hcl) .Marland Kitchen... Take 1 pill by mouth two times a day.    Lisinopril 20 Mg Tabs (Lisinopril) .Marland Kitchen... Take 1 pill by mouth daily.  Orders: T- Capillary Blood Glucose (29518) T-Hgb A1C (in-house)  ([id number])  Labs Reviewed: Creat: 0.91 (06/17/2009)    Reviewed HgBA1c results: 6.1 (10/08/2009)  8.5 (08/07/2009)  Problem # 3:  HYPERTENSION (ICD-401.9) Stable, will continue current regimen.   Her updated medication list for this problem includes:    Lisinopril 20 Mg Tabs (Lisinopril) .Marland Kitchen... Take 1 pill by mouth daily.  BP today: 140/81 Prior BP: 114/72 (09/24/2009)  Prior 10 Yr Risk Heart Disease: 7 % (09/24/2009)  Labs Reviewed: K+: 4.6 (06/17/2009) Creat: : 0.91 (06/17/2009)   Chol: 151 (06/17/2009)   HDL: 46 (06/17/2009)   LDL: 84 (06/17/2009)   TG: 105 (06/17/2009)  Complete Medication List: 1)  Metformin Hcl 1000 Mg Tabs (Metformin hcl) .... Take 1 pill by mouth two times a day. 2)  Lisinopril 20 Mg Tabs (Lisinopril) .... Take 1 pill by mouth daily. 3)  Multivitamins Tabs (Multiple vitamin) .... One tablet once a day  Patient Instructions: 1)  Please schedule a follow-up appointment in 3 months. Prescriptions: LISINOPRIL 20 MG TABS (LISINOPRIL) take 1 pill by mouth daily.  #30 x 6   Entered and Authorized by:   Melida Quitter MD   Signed by:   Melida Quitter MD on 10/08/2009   Method used:   Print then Give to Patient   RxID:   [id number] METFORMIN HCL 1000 MG TABS (METFORMIN HCL) take 1 pill by mouth two times a day.  #60 x 6   Entered and Authorized by:   Melida Quitter MD   Signed by:   Melida Quitter MD on 10/08/2009   Method used:   Print then Give to Patient   RxID:   [id number]    Prevention & Chronic Care Immunizations   Influenza vaccine: Fluvax Non-MCR  (09/24/2009)   Influenza vaccine deferral: Deferred  (06/17/2009)    Tetanus booster: Not documented   Td booster deferral: Deferred  (06/17/2009)    Pneumococcal vaccine: Not documented   Pneumococcal vaccine deferral: Deferred  (06/17/2009)  Colorectal Screening   Hemoccult: Not documented   Hemoccult action/deferral: Deferred  (06/17/2009)    Colonoscopy: Not documented    Colonoscopy action/deferral: Deferred  (06/17/2009)  Other Screening   Pap smear: Not documented   Pap smear action/deferral: Deferred  (10/08/2009)    Mammogram: Not documented   Mammogram action/deferral: Deferred  (10/08/2009)   Smoking status: quit  (10/08/2009)    Screening comments: Mammogram was done Sept. 20,2011  Diabetes Mellitus   HgbA1C: 6.1  (10/08/2009)   HgbA1C action/deferral: Ordered  (08/07/2009)    Eye exam: Not documented   Diabetic eye exam action/deferral: Deferred  (08/07/2009)    Foot exam: yes  (09/24/2009)   Foot exam action/deferral: Do today   High risk foot: Yes  (09/24/2009)   Foot care education: Done  (09/24/2009)    Urine microalbumin/creatinine ratio: 18.1  (06/17/2009)    Diabetes flowsheet reviewed?: Yes   Progress toward A1C goal: At goal  Lipids   Total Cholesterol: 151  (06/17/2009)   LDL: 84  (  06/17/2009)   LDL Direct: Not documented   HDL: 46  (06/17/2009)   Triglycerides: 105  (06/17/2009)  Hypertension   Last Blood Pressure: 140 / 81  (10/08/2009)   Serum creatinine: 0.91  (06/17/2009)   Serum potassium 4.6  (06/17/2009)    Hypertension flowsheet reviewed?: Yes   Progress toward BP goal: At goal  Self-Management Support :   Personal Goals (by the next clinic visit) :     Personal A1C goal: 7  (07/01/2009)     Personal blood pressure goal: 130/80  (07/01/2009)     Personal LDL goal: 100  (07/01/2009)    Patient will work on the following items until the next clinic visit to reach self-care goals:     Medications and monitoring: take my medicines every day, check my blood sugar  (10/08/2009)     Eating: drink diet soda or water instead of juice or soda, eat more vegetables, use fresh or frozen vegetables, eat foods that are low in salt, eat baked foods instead of fried foods  (10/08/2009)     Activity: join a walking program  (10/08/2009)     Other: trying to stay within carb count discussed with CDE - does sit ups for  exercise  (08/07/2009)    Diabetes self-management support: Education handout, Resources for patients handout  (10/08/2009)   Diabetes education handout printed   Last diabetes self-management training by diabetes educator: 07/01/2009    Hypertension self-management support: Education handout, Resources for patients handout  (10/08/2009)   Hypertension education handout printed      Resource handout printed.     Laboratory Results   Blood Tests   Date/Time Received: October 08, 2009 11:10 AM Date/Time Reported: Alric Quan  October 08, 2009 11:10 AM   HGBA1C: 6.1%   (Normal Range: Non-Diabetic - 3-6%   Control Diabetic - 6-8%) CBG Random:: 137mg /dL

## 2010-02-18 NOTE — Assessment & Plan Note (Signed)
Summary: EST-3 MONTH F/U VISIT W/PAP/CH   Vital Signs:  Patient profile:   55 year old female Height:      68.5 inches (173.99 cm) Weight:      232.6 pounds (105.73 kg) BMI:     34.98 Temp:     96.8 degrees F (36 degrees C) oral Pulse rate:   92 / minute BP sitting:   150 / 88  (left arm)  Vitals Entered By: Stanton Kidney Ditzler RN (December 17, 2009 3:38 PM) Is Patient Diabetic? Yes Did you bring your meter with you today? No Pain Assessment Patient in pain? yes     Location: left foot Intensity: 5 Type: swollen Onset of pain  since 05/2009 Nutritional Status BMI of > 30 = obese Nutritional Status Detail appetite down CBG Result 61  Have you ever been in a relationship where you felt threatened, hurt or afraid?denies   Does patient need assistance? Functional Status Self care Ambulation Normal Comments FU - has appt with vascular doctor 12/28/09. 4:20PM OJ and 2 pkg graham crackers given. Pt states had lunch. 4:45PM CBG 70 - pt wanted another OJ.   Primary Care Provider:  Leodis Sias MD   History of Present Illness: Patient is a 55 year old female who presents today for a follow up of her chronic medical conditions as well as her left leg swelling.  Her leg swelling started a few months ago and she has been followed several times since it started.  Venous dopplers have showed no DVT or superficial clots.  She denies any fever, chills, nausea, vomiting, redness in the leg, or warmth in the leg.  The swelling does get better if she has the leg elevated and if she uses the compression stocking.  She does need a new stocking because current one feels like it is cutting off her circulation at the ankle.    Her diabetes is well controlled lately. She is due for a HgBA1C today and does need to have her eye exam.  She is also due for laboratory monitoring for her medications.   Depression History:      The patient denies a depressed mood most of the day and a diminished  interest in her usual daily activities.        The patient denies that she feels like life is not worth living, denies that she wishes that she were dead, and denies that she has thought about ending her life.         Preventive Screening-Counseling & Management  Alcohol-Tobacco     Smoking Status: quit     Year Quit: only smoked when 55 y/o  Current Medications (verified): 1)  Metformin Hcl 1000 Mg Tabs (Metformin Hcl) .... Take 1 Pill By Mouth Two Times A Day. 2)  Lisinopril 20 Mg Tabs (Lisinopril) .... Take 1 Pill By Mouth Daily. 3)  Multivitamins  Tabs (Multiple Vitamin) .... One Tablet Once A Day  Allergies: 1)  ! Codeine  Past History:  Past medical, surgical, family and social histories (including risk factors) reviewed, and no changes noted (except as noted below).  Past Medical History: Reviewed history from 08/07/2009 and no changes required. Morbid obesity formerly over 400 lbs.  Past Surgical History: Reviewed history from 08/07/2009 and no changes required. Oophectomy bilateral in 1975 Appendectomy  Family History: Reviewed history from 08/07/2009 and no changes required. 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 Smoked when she was a teenager no alcohol no Drugs  Review of Systems       The patient complains of peripheral edema.  The patient denies anorexia, fever, weight loss, weight gain, vision loss, decreased hearing, hoarseness, chest pain, syncope, dyspnea on exertion, prolonged cough, headaches, hemoptysis, abdominal pain, melena, hematochezia, severe indigestion/heartburn, hematuria, incontinence, genital sores, muscle weakness, suspicious skin lesions, transient  blindness, difficulty walking, depression, unusual weight change, abnormal bleeding, enlarged lymph nodes, angioedema, and breast masses.    Physical Exam  Additional Exam:  General: Well developed, female in no acute distress.  Vital signs reviewed.  Head: Atraumatic, normocephalic with no signs of trauma  Eyes: PERRLA, EOM intact  Ears: TM intact  Nose: Nares patent, mucosa is pink and moist, no polyps noted  Mouth: Mucosa is pink and moist, poor dention,   Neck: Supple, full ROM, no thyromegaly or masses noted  Resp: Clear to ascultation bilaterally, no wheezes, rales, or rhonchi noted  CV: Regular rate and rhythm with no murmurs, rubs, or gallops noted  Abdomen: Soft, non-tender, non-distended with normal bowel sounds  Musculoskelatal: ROM full with intact strength, no pain to palpation, left leg has 3+ edema compared to the right.  Pulse is difficult to palpate because of the swelling. No pain to palpation and no palpable cord.  Inginal lymph nodes are nonpalpable.   Neurologic: Alert and oriented x3, Cranial nerves II-XII grossly intact, DTR normal and symmetric  Pulses: Radial, brachial, carotid, femoral, dorsal pedis, and posterior tibial pulses equal and symmetric    Diabetes Management Exam:    Foot Exam (with socks and/or shoes not present):       Sensory-Monofilament:          Left foot: normal          Right foot: normal   Impression & Recommendations:  Problem # 1:  LEG EDEMA (ICD-782.3) The swelling is stable at this point and has been worsed up extensively for a cause of the swelling.  She has a follow up appointment with vascular surgery on the 12th of December so we will follow their suggestions.  A CT scan of her hip and pelvis may be helpful in searching for a cause of the insufficiency.  Ultrasound would be a possiblity but lower resolution.   Orders: T-Basic Metabolic Panel [phone])  Problem # 2:  OPEN WOUND TOE WITHOUT MENTION COMPLICATION (ICD-893.0) Her  toes look much improved and are both closed.  With her diabetes we need to keep a close eye on her feet for wounds.    Problem # 3:  DM (ICD-250.00) She is on Metformin currently and is doing well.  Her A1C is excellent.  We will continue her metformin for now and follow her A1C and possibly decrease her metformin if it continues to fall.  There is little risk for hypoglycemia but we will need to monitor it.  She is in need of a bmet for monitoring her medications.  Her updated medication list for this problem includes:    Metformin Hcl 1000 Mg Tabs (Metformin hcl) .Marland Kitchen... Take 1 pill by mouth two times a day.    Lisinopril 20 Mg Tabs (Lisinopril) .Marland Kitchen... Take 1 pill by mouth daily.  Orders: T-Hgb A1C (in-house) [id number]) T-Basic Metabolic Panel [id number])  Labs Reviewed: Creat: 0.91 (06/17/2009)  Reviewed HgBA1c results: 5.7 (12/17/2009)  6.1 (10/08/2009)  Problem # 4:  HYPERTENSION (ICD-401.9) Her blood pressure is a bit elevated today.  We will watch it and if it is elevated at her follow up we will consider escalating her regiman. Her updated medication list for this problem includes:    Lisinopril 20 Mg Tabs (Lisinopril) .Marland Kitchen... Take 1 pill by mouth daily.  Orders: T-Basic Metabolic Panel [id number])  BP today: 150/88 Prior BP: 120/72 (11/30/2009)  Prior 10 Yr Risk Heart Disease: 7 % (09/24/2009)  Labs Reviewed: K+: 4.6 (06/17/2009) Creat: : 0.91 (06/17/2009)   Chol: 151 (06/17/2009)   HDL: 46 (06/17/2009)   LDL: 84 (06/17/2009)   TG: 105 (06/17/2009)  Problem # 5:  Preventive Health Care (ICD-V70.0) She is in need of a tetanus shot and pneumovax.  We will give her hemoccult cards today for her colon cancer screening.  She is encouraged to return them.   Complete Medication List: 1)  Metformin Hcl 1000 Mg Tabs (Metformin hcl) .... Take 1 pill by mouth two times a day. 2)  Lisinopril 20 Mg Tabs (Lisinopril) .... Take 1 pill by mouth daily. 3)  Multivitamins Tabs (Multiple  vitamin) .... One tablet once a day  Other Orders: T-Hemoccult Card-Multiple (take home) ([id number]) Capillary Blood Glucose/CBG ([id number])  Patient Instructions: 1)  Come back tomorrow to have your blood drawn.  If there are any results that need follow up I will call you. 2)  COntinue taking your medications as prescribed. 3)  Keep your appointment with Vascular and have them send a copy of your visit and any testing to [place]. 4)  Follow up in clinic in 1 month.   Orders Added: 1)  T-Hemoccult Card-Multiple (take home) [[id number]] 2)  Capillary Blood Glucose/CBG [[id number]] 3)  T-Hgb A1C (in-house) [[id number]] 4)  T-Basic Metabolic Panel [[id number]] 5)  Est. Patient Level III [29562]   Process Orders Check Orders Results:     [facility]: ABN not required for this insurance Tests Sent for requisitioning (December 19, 2009 9:51 PM):     12/17/2009: [facility] -- T-Basic Metabolic Panel [phone] (signed)     Prevention & Chronic Care Immunizations   Influenza vaccine: Fluvax Non-MCR  (09/24/2009)   Influenza vaccine deferral: Deferred  (06/17/2009)    Tetanus booster: Not documented   Td booster deferral: Refused  (12/17/2009)    Pneumococcal vaccine: Not documented   Pneumococcal vaccine deferral: Refused  (12/17/2009)  Colorectal Screening   Hemoccult: Not documented   Hemoccult action/deferral: Ordered  (11/30/2009)    Colonoscopy: Not documented   Colonoscopy action/deferral: Deferred  (06/17/2009)  Other Screening   Pap smear: Not documented   Pap smear action/deferral: Deferred  (10/08/2009)    Mammogram: ASSESSMENT: Negative - BI-RADS 1^MS DIGITAL SCREENING  (10/06/2009)   Mammogram action/deferral: Deferred  (10/08/2009)   Smoking status: quit  (12/17/2009)  Diabetes Mellitus   HgbA1C: 5.7  (12/17/2009)   HgbA1C action/deferral: Ordered  (08/07/2009)    Eye exam: Not documented   Diabetic eye exam action/deferral:  Ophthalmology referral  (11/30/2009)    Foot exam: yes  (12/17/2009)   Foot exam action/deferral: Do today   High risk foot: Yes  (12/17/2009)   Foot care education: Done  (12/17/2009)   Foot exam due: 03/19/2010    Urine microalbumin/creatinine ratio: 18.1  (06/17/2009)    Diabetes flowsheet reviewed?: Yes   Progress toward A1C goal: At goal  Lipids   Total Cholesterol: 151  (06/17/2009)  LDL: 84  (06/17/2009)   LDL Direct: Not documented   HDL: 46  (06/17/2009)   Triglycerides: 105  (06/17/2009)  Hypertension   Last Blood Pressure: 150 / 88  (12/17/2009)   Serum creatinine: 0.91  (06/17/2009)   Serum potassium 4.6  (06/17/2009)    Hypertension flowsheet reviewed?: Yes   Progress toward BP goal: Deteriorated  Self-Management Support :   Personal Goals (by the next clinic visit) :     Personal A1C goal: 7  (07/01/2009)     Personal blood pressure goal: 130/80  (07/01/2009)     Personal LDL goal: 100  (07/01/2009)    Patient will work on the following items until the next clinic visit to reach self-care goals:     Medications and monitoring: take my medicines every day, check my blood sugar, bring all of my medications to every visit, examine my feet every day  (12/17/2009)     Eating: drink diet soda or water instead of juice or soda, eat more vegetables, use fresh or frozen vegetables, eat foods that are low in salt, eat baked foods instead of fried foods, eat fruit for snacks and desserts, limit or avoid alcohol  (12/17/2009)     Activity: take a 30 minute walk every day  (11/30/2009)     Other: trying to stay within carb count discussed with CDE - does sit ups for exercise  (08/07/2009)    Diabetes self-management support: Written self-care plan, Education handout, Resources for patients handout  (12/17/2009)   Diabetes care plan printed   Diabetes education handout printed   Last diabetes self-management training by diabetes educator: 07/01/2009    Hypertension  self-management support: Written self-care plan, Education handout, Resources for patients handout  (12/17/2009)   Hypertension self-care plan printed.   Hypertension education handout printed      Resource handout printed.    Last LDL:                                                 84 (06/17/2009 8:12:00 PM)        Diabetic Foot Exam Foot Inspection Is there a history of a foot ulcer?              Yes Is there a foot ulcer now?              No Can the patient see the bottom of their feet?          Yes Are the shoes appropriate in style and fit?          Yes Is there swelling or an abnormal foot shape?          Yes Are the toenails long?                No Are the toenails thick?                Yes Are the toenails ingrown?              No Is there heavy callous build-up?              No Is there a claw toe deformity?                          No Is there elevated skin temperature?  No Is there limited ankle dorsiflexion?            No Is there foot or ankle muscle weakness?            No Do you have pain in calf while walking?           No      Diabetic Foot Care Education :Patient educated on appropriate care of diabetic feet.  Pulse Check          Right Foot          Left Foot Posterior Tibial:        2+            1+ Dorsalis Pedis:        2+            1+ Comments: left foot and left leg  very much swollen - pt states since 5.2011 - to see vascular doctor 12/28/09. High Risk Feet? Yes Set Next Diabetic Foot Exam here: 03/19/2010   10-g (5.07) Semmes-Weinstein Monofilament Test Performed by: Stanton Kidney Ditzler RN          Right Foot          Left Foot Visual Inspection     normal         normal Test Control      normal         normal Site 1         normal         abnormal Site 2         normal         normal Site 3         normal         normal Site 4         normal         abnormal Site 5         normal         normal Site 6         normal          normal Site 7         normal         abnormal Site 8         normal         normal Site 9         normal         abnormal Site 10         normal         normal  Impression      normal         normal  Laboratory Results   Blood Tests   Date/Time Received: December 18, 2009 11:01 AM  Date/Time Reported: Alric Quan  December 18, 2009 11:02 AM   HGBA1C: 5.7%   (Normal Range: Non-Diabetic - 3-6%   Control Diabetic - 6-8%) CBG Random:: 61mg /dL  Comments: PATIENT RETURNED FOR LAB WORK ON 12-18-09 HGB A1C COLLECTED AT THAT TIME Alric Quan  December 18, 2009 11:02 AM

## 2010-02-18 NOTE — Miscellaneous (Signed)
Summary: HEALTH INFORMATION  HEALTH INFORMATION   Imported By: Margie Billet 11/30/2009 15:35:07  _____________________________________________________________________  External Attachment:    Type:   Image     Comment:   External Document

## 2010-02-18 NOTE — Consult Note (Signed)
Summary: [facility]  [facility]   Imported By: Louretta Parma 01/05/2010 16:44:09  _____________________________________________________________________  External Attachment:    Type:   Image     Comment:   External Document

## 2010-02-18 NOTE — Assessment & Plan Note (Signed)
Summary: NEW-ER/FU PER DR WILSON/[facility]   Vital Signs:  Patient profile:   55 year old female Height:      70.25 inches (178.44 cm) Weight:      235.8 pounds (107.18 kg) BMI:     33.71 Temp:     96.7 degrees F (35.94 degrees C) oral Pulse rate:   85 / minute BP sitting:   145 / 87  (right arm)  Vitals Entered By: Stanton Kidney Ditzler RN (June 17, 2009 10:59 AM) Is Patient Diabetic? Yes Did you bring your meter with you today? No Pain Assessment Patient in pain? no      Nutritional Status BMI of 25 - 29 = overweight Nutritional Status Detail appetite good  Have you ever been in a relationship where you felt threatened, hurt or afraid?denies   Does patient need assistance? Functional Status Self care Ambulation Normal Comments New pt to [facility] - needs to see [facility]. FU ER - feeling better. ER dx diabetes.   Diabetic Foot Exam Foot Inspection Is there a history of a foot ulcer?              No Is there a foot ulcer now?              Yes Can the patient see the bottom of their feet?          Yes Are the shoes appropriate in style and fit?          No Is there swelling or an abnormal foot shape?          Yes Are the toenails long?                No Are the toenails thick?                No Are the toenails ingrown?              No Is there heavy callous build-up?              Yes Is there pain in the calf muscle (Intermittent claudication) when walking?    NoIs there a claw toe deformity?              No Is there elevated skin temperature?            No Is there limited ankle dorsiflexion?            No Is there foot or ankle muscle weakness?            No  Diabetic Foot Care Education    10-g (5.07) Semmes-Weinstein Monofilament Test Performed by: Stanton Kidney Ditzler RN          Right Foot          Left Foot Visual Inspection     normal         normal Test Control      normal         normal Site 1         normal         normal Site 2         normal         normal Site 3          normal         abnormal Site 4         normal         normal Site 5  normal         normal Site 6         normal         normal Site 7         normal         normal Site 8         normal         normal Site 9         normal         normal Site 10         normal         normal  Impression      normal         normal   History of Present Illness: [name] is a 55 yo lady was recently seen in ED for cellutlitis and was diagnosed with DM and started on glimepiride 1 mg bid and metformin 500 mg bid . Her HbA1c was 14.2. She is still taking clindamycin for her cellulitis. She is checking her CBG at home 4 times a day and usually it runs close to 300. Initially before she was diagnosed with DM she was very thirsty but no more after she started taking the meds. Now she has started to change her diet as well.   Depression History:      The patient denies a depressed mood most of the day and a diminished interest in her usual daily activities.         Preventive Screening-Counseling & Management  Alcohol-Tobacco     Smoking Status: quit  Current Medications (verified): 1)  Glimepiride 4 Mg Tabs (Glimepiride) .... Take 1 Pill By Mouth Daily. 2)  Metformin Hcl 1000 Mg Tabs (Metformin Hcl) .... Take 1 Pill By Mouth Two Times A Day. 3)  Lisinopril 10 Mg Tabs (Lisinopril) .... Take 1 Pill By Mouth Daily. 4)  Bactrim Ds 800-160 Mg Tabs (Sulfamethoxazole-Trimethoprim) .... Take 1 Pill By Mouth Two Times A Day.  Allergies (verified): 1)  ! Codeine  Social History: Smoking Status:  quit  Review of Systems      See HPI  Physical Exam  Mouth:  pharynx pink and moist.   Lungs:  normal breath sounds, no crackles, and no wheezes.   Heart:  normal rate, regular rhythm, no murmur, no gallop, and no rub.   Abdomen:  soft and non-tender.   Pulses:  R posterior tibial normal, R dorsalis pedis normal, L posterior tibial normal, and L dorsalis pedis normal.   Extremities:  trace left pedal  edema and trace right pedal edema. Lef leg is more swollen than the right, but without any calf tenderness. Left leg is more warmth and there are small healing papules noted.   Right foot has 3 cm healing ulcer with scabs.   Neurologic:  alert & oriented X3.    Diabetes Management Exam:    Foot Exam (with socks and/or shoes not present):       Sensory-Monofilament:          Left foot: normal          Right foot: normal   Impression & Recommendations:  Problem # 1:  CELLULITIS (ICD-682.9) Tomorrow is her last day of clindamycin, but she still has some signs of infection on her left leg. I will start her on bactrim for total of 7 days. She will call the clinic if her symptoms worsen and will f/u in 2 wks.  Her updated medication  list for this problem includes:    Bactrim Ds 800-160 Mg Tabs (Sulfamethoxazole-trimethoprim) .Marland Kitchen... Take 1 pill by mouth two times a day.  Problem # 2:  HYPERTENSION (ICD-401.9) BP little increased than the goal. WIll start following and check following.  Her updated medication list for this problem includes:    Lisinopril 10 Mg Tabs (Lisinopril) .Marland Kitchen... Take 1 pill by mouth daily.  Orders: T-Comprehensive Metabolic Panel [phone]) T-Lipid Profile [phone]) T-TSH [phone]) T-T4, Free [phone]) T-Urine Microalbumin w/creat. ratio ([id number])  BP today: 145/87  Problem # 3:  DM (ICD-250.00) A1c more than 14 and average home CBG 300. She was started on glimeiride from ED and I will change the dose from 1 mg two times a day to 4 mg daily and will f/u in 2 wks. She will continue to check it 4 times a day. If needed, a/t uptodate, we can increase the dose of glimeperide to 8 mg daily. I will also increase metormin to 500mg  two times a day.  I had a long discussion for about 45 minutes only for DM. I explained to her about the nature of the disease, natural progression, ways to monitor DM, s/s of hypoglycemia, multidisciplinary approach to  treatment, importance of diet and exercise etc. She will also see D. Victory Dakin and will refer to eye MD. Foot exam done today and I will also check following labs.   Her updated medication list for this problem includes:    Glimepiride 4 Mg Tabs (Glimepiride) .Marland Kitchen... Take 1 pill by mouth daily.    Metformin Hcl 1000 Mg Tabs (Metformin hcl) .Marland Kitchen... Take 1 pill by mouth two times a day.    Lisinopril 10 Mg Tabs (Lisinopril) .Marland Kitchen... Take 1 pill by mouth daily.  Orders: T-Comprehensive Metabolic Panel [phone] T-Lipid Profile [phone]) T-TSH [phone]) T-T4, Free [phone]) T-Urine Microalbumin w/creat. ratio [phone]) Diabetic Clinic Referral (Diabetic) Ophthalmology Referral (Ophthalmology)  Problem # 4:  Preventive Health Care (ICD-V70.0) WIll discuss about this after her DM is better controlled.   Complete Medication List: 1)  Glimepiride 4 Mg Tabs (Glimepiride) .... Take 1 pill by mouth daily. 2)  Metformin Hcl 1000 Mg Tabs (Metformin hcl) .... Take 1 pill by mouth two times a day. 3)  Lisinopril 10 Mg Tabs (Lisinopril) .... Take 1 pill by mouth daily. 4)  Bactrim Ds 800-160 Mg Tabs (Sulfamethoxazole-trimethoprim) .... Take 1 pill by mouth two times a day.  Patient Instructions: 1)  Please schedule a follow-up appointment in 2 weeks. 2)  Limit your Sodium (Salt) to less than 2 grams a day(slightly less than 1/2 a teaspoon) to prevent fluid retention, swelling, or worsening of symptoms. 3)  It is important that you exercise regularly at least 20 minutes 5 times a week. If you develop chest pain, have severe difficulty breathing, or feel very tired , stop exercising immediately and seek medical attention. 4)  You need to lose weight. Consider a lower calorie diet and regular exercise.  5)  See your eye doctor yearly to check for diabetic eye damage. 6)  Check your feet each night for sore areas, calluses or signs of infection. 7)  Check your Blood Pressure regularly. If  it is above: you should make an appointment. Prescriptions: BACTRIM DS 800-160 MG TABS (SULFAMETHOXAZOLE-TRIMETHOPRIM) take 1 pill by mouth two times a day.  #14 x 0   Entered and Authorized by:   Jason Coop MD   Signed by:   Jason Coop MD on 06/17/2009   Method used:  Print then Give to Patient   RxID:   [id number] LISINOPRIL 10 MG TABS (LISINOPRIL) take 1 pill by mouth daily.  #30 x 0   Entered and Authorized by:   Jason Coop MD   Signed by:   Jason Coop MD on 06/17/2009   Method used:   Print then Give to Patient   RxID:   [id number] METFORMIN HCL 1000 MG TABS (METFORMIN HCL) take 1 pill by mouth two times a day.  #60 x 0   Entered and Authorized by:   Jason Coop MD   Signed by:   Jason Coop MD on 06/17/2009   Method used:   Print then Give to Patient   RxID:   [id number] GLIMEPIRIDE 4 MG TABS (GLIMEPIRIDE) take 1 pill by mouth daily.  #30 x 0   Entered and Authorized by:   Jason Coop MD   Signed by:   Jason Coop MD on 06/17/2009   Method used:   Print then Give to Patient   RxID:   [id number]  Process Orders Check Orders Results:     [facility]: ABN not required for this insurance Tests Sent for requisitioning (June 17, 2009 6:18 PM):     06/17/2009: [facility] -- T-Comprehensive Metabolic Panel [[id number]] (signed)     06/17/2009: [facility] -- T-Lipid Profile [phone] (signed)     06/17/2009: [facility] -- T-TSH [phone] (signed)     06/17/2009: [facility] -- [place], [place] [[id number]] (signed)     06/17/2009: [facility] -- T-Urine Microalbumin w/creat. ratio [[phone]] (signed)    Prevention & Chronic Care Immunizations   Influenza vaccine: Not documented   Influenza vaccine deferral: Deferred  (06/17/2009)    Tetanus booster: Not documented   Td booster deferral:  Deferred  (06/17/2009)    Pneumococcal vaccine: Not documented   Pneumococcal vaccine deferral: Deferred  (06/17/2009)  Colorectal Screening   Hemoccult: Not documented   Hemoccult action/deferral: Deferred  (06/17/2009)    Colonoscopy: Not documented   Colonoscopy action/deferral: Deferred  (06/17/2009)  Other Screening   Pap smear: Not documented   Pap smear action/deferral: Deferred  (06/17/2009)    Mammogram: Not documented   Mammogram action/deferral: Deferred  (06/17/2009)   Smoking status: quit  (06/17/2009)  Diabetes Mellitus   HgbA1C: Not documented   HgbA1C action/deferral: Deferred  (06/17/2009)    Eye exam: Not documented    Foot exam: yes  (06/17/2009)   Foot exam action/deferral: Do today   High risk foot: Not documented   Foot care education: Not documented    Urine microalbumin/creatinine ratio: Not documented    Diabetes flowsheet reviewed?: Yes   Progress toward A1C goal: Unchanged  Lipids   Total Cholesterol: Not documented   LDL: Not documented   LDL Direct: Not documented   HDL: Not documented   Triglycerides: Not documented  Hypertension   Last Blood Pressure: 145 / 87  (06/17/2009)   Serum creatinine: Not documented   Serum potassium Not documented CMP ordered     Hypertension flowsheet reviewed?: Yes   Progress toward BP goal: Unchanged  Self-Management Support :    Diabetes self-management support: Written self-care plan  (06/17/2009)   Diabetes care plan printed   Referred for diabetes self-mgmt training.    Hypertension self-management support: Written self-care plan  (06/17/2009)   Hypertension self-care plan printed.   Nursing Instructions: Diabetic foot exam today    Process Orders Check Orders Results:  [facility]: ABN not required for this insurance Tests Sent for requisitioning (June 17, 2009 6:18 PM):     06/17/2009: [facility] -- T-Comprehensive Metabolic Panel  [[id number]] (signed)     06/17/2009: [facility] -- T-Lipid Profile [phone] (signed)     06/17/2009: [facility] -- T-TSH [phone] (signed)     06/17/2009: [facility] -- [place], [place] [[id number]] (signed)     06/17/2009: [facility] -- T-Urine Microalbumin w/creat. ratio [[id number]] (signed)           Diabetic Foot Exam    10-g (5.07) Semmes-Weinstein Monofilament Test Performed by: Stanton Kidney Ditzler RN          Right Foot          Left Foot Visual Inspection     normal         normal

## 2010-02-18 NOTE — Assessment & Plan Note (Signed)
Summary: L leg pain, swelling/pcp-pribula/hla   Vital Signs:  Patient profile:   55 year old female Height:      68.5 inches (173.99 cm) Weight:      234.7 pounds (106.68 kg) BMI:     35.29 Temp:     97.0 degrees F (36.11 degrees C) oral Pulse rate:   84 / minute BP sitting:   114 / 72  (right arm) Cuff size:   large  Vitals Entered By: Cynda Familia Duncan Dull) (September 24, 2009 10:55 AM) CC: pain/swelling/ numbness in left foot off and on x 2wks, recurrent low blood sugars, Hypertension Management Is Patient Diabetic? Yes Did you bring your meter with you today? No Research Study Name: Pt instructed to meet with D [facility] for financial assistance Pain Assessment Patient in pain? yes     Location: left foot Intensity: 5 Nutritional Status BMI of > 30 = obese  Have you ever been in a relationship where you felt threatened, hurt or afraid?No   Does patient need assistance? Functional Status Self care Ambulation Normal   Diabetic Foot Exam Foot Inspection Is there a history of a foot ulcer?              No Is there a foot ulcer now?              No Can the patient see the bottom of their feet?          Yes Are the shoes appropriate in style and fit?          Yes Is there swelling or an abnormal foot shape?          Yes Are the toenails long?                Yes Are the toenails thick?                No Are the toenails ingrown?              No Is there heavy callous build-up?              No  Diabetic Foot Care Education Patient educated on appropriate care of diabetic feet.  Comments: ulcer-like area noted on the back of right heel, pt states it's been there for about 1-2 weeks High Risk Feet? Yes   10-g (5.07) Semmes-Weinstein Monofilament Test Performed by: Lynn Ito          Right Foot          Left Foot Site 1         abnormal         abnormal Site 2         abnormal         abnormal Site 3         abnormal         abnormal Site 4         normal          abnormal Site 5         normal         normal Site 6         abnormal         abnormal  Impression      abnormal         abnormal   Primary Care Provider:  Leodis Sias MD  CC:  pain/swelling/ numbness in left foot off and on x 2wks, recurrent low blood sugars, and  Hypertension Management.  History of Present Illness: This is 55 year old female with PMH of DM and Hypertension who presents to the office with pain and swelling in her left leg.  1. Left Foot, swelling, painful, redness, with no open lesions,  it has been worth since the last weeks.  She had a history of cellulitis in May was treated with clindamycin and bactrim. She was seen in the ED and DVT was ruled out that time. She was further advised to wear compression stockings for possible venous insufficiency. Patient denis fevers, chills, recent injuries or trauma   2. DM on Metformin 1000 mg two times a day, Glimepiride 4 mg , HgA1c in 7/22 8.7 complaining about lows  between 50 and 60 during the day since 3 weeks, she noted that she feels during this episodes very  dizzy and experiences pre- syncope.  Exercises regular and changed her lifestyle completly. Her weight was around  400  3 to 4 years ago n  3. BP improved on lisinopril 20 mg     Hypertension History:      Positive major cardiovascular risk factors include diabetes and hypertension.  Negative major cardiovascular risk factors include female age less than 51 years old and non-tobacco-user status.     Preventive Screening-Counseling & Management  Alcohol-Tobacco     Smoking Status: quit     Year Quit: only smoked when 55 y/o  Current Medications (verified): 1)  Metformin Hcl 1000 Mg Tabs (Metformin Hcl) .... Take 1 Pill By Mouth Two Times A Day. 2)  Lisinopril 20 Mg Tabs (Lisinopril) .... Take 1 Pill By Mouth Daily. 3)  Multivitamins  Tabs (Multiple Vitamin) .... One Tablet Once A Day 4)  Doxycycline Hyclate 100 Mg Caps (Doxycycline Hyclate) .... Take  One Tablet By Mouth Two Times A Day For 14 Days and Then Stop  Allergies: 1)  ! Codeine  Review of Systems       as per hpi General:  Complains of loss of appetite and weight loss; denies chills, fatigue, and fever; weight loss intentional, occasionally loss of appetite. Eyes:  Denies blurring and double vision. CV:  Denies chest pain or discomfort and lightheadness. Resp:  Denies shortness of breath. GI:  Denies change in bowel habits. GU:  Denies dysuria, nocturia, and urinary frequency. MS:  Complains of joint pain and joint swelling; left ankle .  Physical Exam  General:  Well-developed,well-nourished,in no acute distress; alert,appropriate and cooperative throughout examination Neck:  No deformities, masses, or tenderness noted. Lungs:  Normal respiratory effort, chest expands symmetrically. Lungs are clear to auscultation, no crackles or wheezes. Heart:  Normal rate and regular rhythm. S1 and S2 normal without gallop, murmur, click, rub or other extra sounds. Abdomen:  Bowel sounds positive,abdomen soft and non-tender without masses, organomegaly or hernias noted. Msk:  normal ROM,left  ankle swelling    Pulses:  R  carotid,radial,femoral,dorsalis pedis and posterior tibial pulses are full. L carotid, radialis, femoralis pulses are full but dorsalis pedis 1+, tibial pulse not palpable  Extremities:  left foot from knee to the foot: non pitting edema especially the ankle, warm to touch with not  erythema, right foot: dry looking ulcer  and some heeling laceration pretibial   Diabetes Management Exam:    Foot Exam (with socks and/or shoes not present):       Sensory-Monofilament:          Left foot: abnormal          Right foot:  abnormal   Impression & Recommendations:  Problem # 1:  LEG EDEMA left  (ICD-782.3) DD included DVT, lymphatic obstruction, venous insufficiency , cellulitis.  Doppler of the  lower extremities did not show any evidence of DVT or superficial  thrombosis and no evidence of baker's cyst on the left.  Cellulitis combined with venous insufficiency was most likely diagnosis. Patient was started on doxycyline for 14 days and was further advised to use compression stockings and elevation of the lower extremities as often as possible.   Orders: LE Venous Duplex (DVT) (DVT)  Problem # 2:  DM (ICD-250.00) At this point Hypoglycemic episodes are more worrisome then high blood glucose levels. I recommended to stop Glimepiride . Patient was advised to  check blood glucose level on a regular basis to give [place] an idea when the  Hyperglycemic episodes occurs. Consider to change accordingly. Further advised patient to eat  regulary not to skip any meals. Patient was encouraged to wear good footwear and may be refered to a podiatrist. Currently she is not able to afford it. Wanted to see Rudell Cobb to apply for orange card today.   The following medications were removed from the medication list:    Glimepiride 4 Mg Tabs (Glimepiride) .Marland Kitchen... Take 1 pill by mouth daily. Her updated medication list for this problem includes:    Metformin Hcl 1000 Mg Tabs (Metformin hcl) .Marland Kitchen... Take 1 pill by mouth two times a day.    Lisinopril 20 Mg Tabs (Lisinopril) .Marland Kitchen... Take 1 pill by mouth daily.  Problem # 3:  HYPERTENSION (ICD-401.9) BP improved on current medication. Continue with regimen.  Her updated medication list for this problem includes:    Lisinopril 20 Mg Tabs (Lisinopril) .Marland Kitchen... Take 1 pill by mouth daily.  Complete Medication List: 1)  Metformin Hcl 1000 Mg Tabs (Metformin hcl) .... Take 1 pill by mouth two times a day. 2)  Lisinopril 20 Mg Tabs (Lisinopril) .... Take 1 pill by mouth daily. 3)  Multivitamins Tabs (Multiple vitamin) .... One tablet once a day 4)  Doxycycline Hyclate 100 Mg Caps (Doxycycline hyclate) .... Take one tablet by mouth two times a day for 14 days and then stop  Other Orders: Influenza Vaccine NON MCR (04540) Mammogram  (Screening) (Mammo)  Hypertension Assessment/Plan:      The patient's hypertensive risk group is category C: Target organ damage and/or diabetes.  Her calculated 10 year risk of coronary heart disease is 7 %.  Today's blood pressure is 114/72.    Patient Instructions: 1)  Please schedule a follow-up appointment in 2 weeks. 2)   Stop Glimepiride. Check blood sugars at least 3 times a day ( 1 time  in the morning before meal,  1 time 2 hours after lunch and 1 times at bedtime.  3)  Use good foot wear.  Prescriptions: DOXYCYCLINE HYCLATE 100 MG CAPS (DOXYCYCLINE HYCLATE) Take one tablet by mouth two times a day for 14 days and then stop  #28 x 0   Entered and Authorized by:   Almyra Deforest MD   Signed by:   Almyra Deforest MD on 09/24/2009   Method used:   Print then Give to Patient   RxID:   [id number]    Prevention & Chronic Care Immunizations   Influenza vaccine: Fluvax Non-MCR  (09/24/2009)   Influenza vaccine deferral: Deferred  (06/17/2009)    Tetanus booster: Not documented   Td booster deferral: Deferred  (06/17/2009)    Pneumococcal vaccine: Not documented  Pneumococcal vaccine deferral: Deferred  (06/17/2009)  Colorectal Screening   Hemoccult: Not documented   Hemoccult action/deferral: Deferred  (06/17/2009)    Colonoscopy: Not documented   Colonoscopy action/deferral: Deferred  (06/17/2009)  Other Screening   Pap smear: Not documented   Pap smear action/deferral: Deferred  (06/17/2009)    Mammogram: Not documented   Mammogram action/deferral: Deferred  (06/17/2009)   Smoking status: quit  (09/24/2009)  Diabetes Mellitus   HgbA1C: 8.5  (08/07/2009)   HgbA1C action/deferral: Ordered  (08/07/2009)    Eye exam: Not documented   Diabetic eye exam action/deferral: Deferred  (08/07/2009)    Foot exam: yes  (09/24/2009)   Foot exam action/deferral: Do today   High risk foot: Yes  (09/24/2009)   Foot care education: Done  (09/24/2009)    Urine  microalbumin/creatinine ratio: 18.1  (06/17/2009)    Diabetes flowsheet reviewed?: Yes  Lipids   Total Cholesterol: 151  (06/17/2009)   LDL: 84  (06/17/2009)   LDL Direct: Not documented   HDL: 46  (06/17/2009)   Triglycerides: 105  (06/17/2009)  Hypertension   Last Blood Pressure: 114 / 72  (09/24/2009)   Serum creatinine: 0.91  (06/17/2009)   Serum potassium 4.6  (06/17/2009)    Hypertension flowsheet reviewed?: Yes   Progress toward BP goal: At goal  Self-Management Support :   Personal Goals (by the next clinic visit) :     Personal A1C goal: 7  (07/01/2009)     Personal blood pressure goal: 130/80  (07/01/2009)     Personal LDL goal: 100  (07/01/2009)    Patient will work on the following items until the next clinic visit to reach self-care goals:     Medications and monitoring: take my medicines every day, check my blood sugar  (09/24/2009)     Eating: drink diet soda or water instead of juice or soda, eat foods that are low in salt, eat baked foods instead of fried foods  (09/24/2009)     Activity: join a walking program  (09/24/2009)     Other: trying to stay within carb count discussed with CDE - does sit ups for exercise  (08/07/2009)    Diabetes self-management support: Resources for patients handout, Written self-care plan  (09/24/2009)   Diabetes care plan printed   Last diabetes self-management training by diabetes educator: 07/01/2009    Hypertension self-management support: Resources for patients handout, Written self-care plan  (09/24/2009)   Hypertension self-care plan printed.      Resource handout printed.   Nursing Instructions: Give Flu vaccine today Diabetic foot exam today    Immunizations Administered:  Influenza Vaccine # 1:    Vaccine Type: Fluvax Non-MCR    Site: right deltoid    Mfr: GlaxoSmithKline    Dose: 0.5 ml    Route: IM    Given by: Cynda Familia (AAMA)    Exp. Date: 07/17/2010    Lot #: [id number]    VIS given:  08/11/09 version given September 24, 2009.  Flu Vaccine Consent Questions:    Do you have a history of severe allergic reactions to this vaccine? no    Any prior history of allergic reactions to egg and/or gelatin? no    Do you have a sensitivity to the preservative Thimersol? no    Do you have a past history of Guillan-Barre Syndrome? no    Do you currently have an acute febrile illness? no    Have you ever had a severe reaction to latex?  no    Vaccine information given and explained to patient? yes    Are you currently pregnant? no

## 2010-02-22 ENCOUNTER — Encounter (HOSPITAL_BASED_OUTPATIENT_CLINIC_OR_DEPARTMENT_OTHER): Payer: Self-pay

## 2010-02-22 ENCOUNTER — Other Ambulatory Visit: Payer: Self-pay

## 2010-02-23 ENCOUNTER — Encounter (HOSPITAL_BASED_OUTPATIENT_CLINIC_OR_DEPARTMENT_OTHER): Payer: Self-pay

## 2010-02-23 ENCOUNTER — Other Ambulatory Visit: Payer: Self-pay

## 2010-02-23 LAB — GLUCOSE, CAPILLARY
Glucose-Capillary: 127 mg/dL — ABNORMAL HIGH (ref 70–99)
Glucose-Capillary: 127 mg/dL — ABNORMAL HIGH (ref 70–99)
Glucose-Capillary: 129 mg/dL — ABNORMAL HIGH (ref 70–99)
Glucose-Capillary: 142 mg/dL — ABNORMAL HIGH (ref 70–99)

## 2010-02-24 ENCOUNTER — Other Ambulatory Visit: Payer: Self-pay

## 2010-02-24 NOTE — Assessment & Plan Note (Signed)
Summary: call from [facility], bp low x 1wk/pcp-pribula/hla   Vital Signs:  Patient profile:   55 year old female Height:      68.5 inches (173.99 cm) Weight:      210.0 pounds (95.45 kg) BMI:     31.58 Temp:     97.0 degrees F (36.11 degrees C) oral Pulse rate:   82 / minute BP sitting:   112 / 73  (right arm) Cuff size:   regular  Vitals Entered By: Theotis Barrio NT II (February 15, 2010 9:11 AM) CC: HERE FOR LOW BP /  DM /   BLOOD WORK Is Patient Diabetic? Yes Did you bring your meter with you today? No Pain Assessment Patient in pain? no      Nutritional Status BMI of > 30 = obese  Have you ever been in a relationship where you felt threatened, hurt or afraid?No   Does patient need assistance? Functional Status Self care Ambulation Normal Comments WALKS WITH THE ASSIST OF A WALKER   Primary Care Provider:  Leodis Sias MD  CC:  HERE FOR LOW BP /  DM /   BLOOD WORK.  History of Present Illness: 55yo W with DM, HTN recently admitted to hospital 01/07/10-01/15/10 for infected foot ulcer presents for follow-up of: 1. Blood pressure. She made this appt because her home health nurse has noticed that her blood pressure has been low recently with SBP frequently in the 90s. She has also been feeling fatigued and occassionally lightheaded. Her dose of lisinopril was increased over the past 6 months from 10mg  to 40mg  (of note, this was prior to debridement/treatment of foot infection). HCTZ was briefly added after hospitalization but patient stopped this medication a couple of weeks ago. She did not take her blood pressure medication yet this morning.  2. L foot plantar ulcer. Patient is s/p debridement 01/09/2010. She follows with wound clinic weekly and has home health nursing for wound care. She says that the wound clinic is pleased with her progress. She is scheduled to begin treatments in hyperbaric chamber in the next week or 2.   Preventive Screening-Counseling &  Management  Alcohol-Tobacco     Alcohol drinks/day: 0     Smoking Status: quit     Year Quit: only smoked when 55 y/o  Caffeine-Diet-Exercise     Does Patient Exercise: yes     Type of exercise: sometmes  Medications Prior to Update: 1)  Metformin Hcl 1000 Mg Tabs (Metformin Hcl) .... Take One Tablet Daily 2)  Lisinopril 40 Mg Tabs (Lisinopril) .... Take One Tablet By Mouth Daily For Blood Pressure 3)  Multivitamins  Tabs (Multiple Vitamin) .... One Tablet Once A Day 4)  Hydrochlorothiazide 25 Mg Tabs (Hydrochlorothiazide) .... Take 1 Tablet By Mouth Once A Day 5)  Ferrous Sulfate 325 (65 Fe) Mg Tabs (Ferrous Sulfate) .... Take 1 Tablet By Mouth Once A Day With Orange Juice.  Current Medications (verified): 1)  Metformin Hcl 1000 Mg Tabs (Metformin Hcl) .... Take One Tablet Daily 2)  Lisinopril 10 Mg Tabs (Lisinopril) .... Take 1 Tablet By Mouth Once A Day 3)  Multivitamins  Tabs (Multiple Vitamin) .... One Tablet Once A Day 4)  Ferrous Sulfate 325 (65 Fe) Mg Tabs (Ferrous Sulfate) .... Take 1 Tablet By Mouth Once A Day With Orange Juice.  Allergies: 1)  ! Codeine  Past History:  Past Medical History: Last updated: 08/07/2009 Morbid obesity formerly over 400 lbs.  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 Smoked when she was a teenager no alcohol no Drugs  Review of Systems      See HPI General:  Complains of fatigue; denies chills, fever, and loss of appetite. CV:  Complains of fatigue and lightheadness; denies chest pain or discomfort, fainting, near fainting, palpitations, and swelling  of feet. Resp:  Denies cough and shortness of breath. GI:  Denies abdominal pain and change in bowel habits. GU:  Denies dysuria.  Physical Exam  General:  alert and cooperative to examination.   Head:  normocephalic and atraumatic.   Eyes:  vision grossly intact, pupils equal, pupils round, and pupils reactive to light.   Mouth:  pharynx pink and moist.   Neck:  supple and no masses.   Lungs:  normal breath sounds, no crackles, and no wheezes.   Heart:  normal rate, regular rhythm, no murmur, no gallop, and no rub.   Abdomen:  soft, non-tender, and no distention.   Msk:  Clean bandage over L foot.  Extremities:  No edema.  Neurologic:  alert & oriented X3.   Skin:  turgor normal and no rashes.   Psych:  not anxious appearing and not depressed appearing.     Impression & Recommendations:  Problem # 1:  HYPERTENSION (ICD-401.9) Patient's blood pressure has been low recently at home and her recent symptoms of fatigue and lightheadedness may be related to hypotension. BP today in clinic is normal but patient has not yet taken any antihypertensives this morning. Will D/C HCTZ (which patient had already stopped taking in the last few weeks) and decrease lisinopril to previous dose of 10mg  daily. Blood pressure should continue to be monitored at follow-up.   The following medications were removed from the medication list:    Hydrochlorothiazide 25 Mg Tabs (Hydrochlorothiazide) .Marland Kitchen... Take 1 tablet by mouth once a day Her updated medication list for this problem includes:    Lisinopril 10 Mg Tabs (Lisinopril) .Marland Kitchen... Take 1 tablet by mouth once a day  Problem # 2:  FOOT ULCER, LEFT (ICD-707.10) Healing appropriately. Managed per wound care clinic. Patient scheduled to begin hyperbaric treatments soon.   Problem # 3:  DM (ICD-250.00) Well controlled. Continue metformin. Will refer for diabetic eye exam.   Her updated medication list for this problem includes:    Metformin Hcl 1000 Mg Tabs  (Metformin hcl) .Marland Kitchen... Take one tablet daily    Lisinopril 10 Mg Tabs (Lisinopril) .Marland Kitchen... Take 1 tablet by mouth once a day  Orders: Ophthalmology Referral (Ophthalmology)  Problem # 4:  ANEMIA, NORMOCYTIC (ICD-285.9) During hospitalization, patient found to have normocytic anemia. H&H improved at last visit. Currently taking Fe tablets prescribed by Dr. Tonny Branch to treat question of iron-deficiency component. Colonoscopy and EGD during recent hospitalization were negative for evidence of GI bleed. Continue to monitor.   Her updated medication list for this problem includes:    Ferrous Sulfate 325 (65 Fe) Mg Tabs (Ferrous sulfate) .Marland Kitchen... Take 1 tablet by mouth once a day with orange juice.  Complete Medication List: 1)  Metformin Hcl 1000 Mg Tabs (Metformin hcl) .... Take one tablet daily 2)  Lisinopril 10 Mg Tabs (Lisinopril) .... Take 1 tablet  by mouth once a day 3)  Multivitamins Tabs (Multiple vitamin) .... One tablet once a day 4)  Ferrous Sulfate 325 (65 Fe) Mg Tabs (Ferrous sulfate) .... Take 1 tablet by mouth once a day with orange juice.  Patient Instructions: 1)  Please schedule a follow-up appointment in 1-2 months (preferably with your physician, Dr. Tonny Branch, if possible) to follow-up on your blood pressure and wound healing. Prescriptions: LISINOPRIL 10 MG TABS (LISINOPRIL) Take 1 tablet by mouth once a day  #30 x 6   Entered and Authorized by:   Whitney Post MD   Signed by:   Whitney Post MD on 02/15/2010   Method used:   Electronically to        [facility]* (retail)       [address]       [place], [place]  [address]       Ph: [phone]       Fax: [phone]   RxID:   [id number]    Orders Added: 1)  Ophthalmology Referral [Ophthalmology] 2)  Est. Patient Level IV [53664]     Prevention & Chronic Care Immunizations   Influenza vaccine: Fluvax Non-MCR  (09/24/2009)   Influenza vaccine deferral: Deferred  (06/17/2009)   Influenza  vaccine due: 09/18/2010    Tetanus booster: 12/29/2009: Tdap   Td booster deferral: Refused  (12/17/2009)   Tetanus booster due: 12/30/2019    Pneumococcal vaccine: Pneumovax  (12/29/2009)   Pneumococcal vaccine deferral: Refused  (12/17/2009)   Pneumococcal vaccine due: 10/31/2020  Colorectal Screening   Hemoccult: Not documented   Hemoccult action/deferral: Ordered  (11/30/2009)    Colonoscopy: Rare pandiverticulosis, fair prep   (01/13/2010)   Colonoscopy action/deferral: GI referral  (12/29/2009)  Other Screening   Pap smear: Not documented   Pap smear action/deferral: Deferred  (10/08/2009)    Mammogram: ASSESSMENT: Negative - BI-RADS 1^MS DIGITAL SCREENING  (10/06/2009)   Mammogram action/deferral: Deferred  (10/08/2009)   Mammogram due: 10/07/2011   Smoking status: quit  (02/15/2010)  Diabetes Mellitus   HgbA1C: 5.7  (12/17/2009)   HgbA1C action/deferral: Ordered  (08/07/2009)   Hemoglobin A1C due: 12/18/2010    Eye exam: normal  (12/29/2009)   Diabetic eye exam action/deferral: Ophthalmology referral  (11/30/2009)   Eye exam due: 12/2010    Foot exam: yes  (12/29/2009)   Foot exam action/deferral: Do today   High risk foot: Yes  (12/29/2009)   Foot care education: Done  (12/29/2009)   Foot exam due: 12/30/2010    Urine microalbumin/creatinine ratio: 18.1  (06/17/2009)   Urine microalbumin/cr due: 06/18/2010    Diabetes flowsheet reviewed?: Yes   Progress toward A1C goal: At goal  Lipids   Total Cholesterol: 151  (06/17/2009)   LDL: 84  (06/17/2009)   LDL Direct: Not documented   HDL: 46  (06/17/2009)   Triglycerides: 105  (06/17/2009)   Lipid panel due: 06/18/2010  Hypertension   Last Blood Pressure: 112 / 73  (02/15/2010)   Serum creatinine: 0.94  (01/22/2010)   Serum potassium 5.1  (01/22/2010)    Hypertension flowsheet reviewed?: Yes   Progress toward BP goal: At goal  Self-Management Support :   Personal Goals (by the next clinic visit)  :     Personal A1C goal: 7  (07/01/2009)     Personal blood pressure goal: 130/80  (07/01/2009)     Personal LDL goal: 100  (07/01/2009)    Patient will work on the following items until the next clinic visit  to reach self-care goals:     Medications and monitoring: take my medicines every day, check my blood sugar, check my blood pressure, examine my feet every day  (02/15/2010)     Eating: drink diet soda or water instead of juice or soda, eat more vegetables, eat foods that are low in salt, eat baked foods instead of fried foods, eat fruit for snacks and desserts, limit or avoid alcohol  (02/15/2010)     Activity: take a 30 minute walk every day  (11/30/2009)     Other: trying to stay within carb count discussed with CDE - does sit ups for exercise  (08/07/2009)    Diabetes self-management support: Resources for patients handout  (02/15/2010)   Last diabetes self-management training by diabetes educator: 07/01/2009    Hypertension self-management support: Resources for patients handout  (02/15/2010)    Self-management comments: PATIENT STATES THAT SHE DOES ROM IN HOME      Resource handout printed.

## 2010-02-25 ENCOUNTER — Other Ambulatory Visit: Payer: Self-pay

## 2010-02-25 LAB — GLUCOSE, CAPILLARY
Glucose-Capillary: 100 mg/dL — ABNORMAL HIGH (ref 70–99)
Glucose-Capillary: 149 mg/dL — ABNORMAL HIGH (ref 70–99)
Glucose-Capillary: 109 mg/dL — ABNORMAL HIGH (ref 70–99)

## 2010-02-26 ENCOUNTER — Other Ambulatory Visit: Payer: Self-pay

## 2010-02-26 LAB — GLUCOSE, CAPILLARY
Glucose-Capillary: 163 mg/dL — ABNORMAL HIGH (ref 70–99)
Glucose-Capillary: 139 mg/dL — ABNORMAL HIGH (ref 70–99)

## 2010-03-01 ENCOUNTER — Other Ambulatory Visit: Payer: Self-pay

## 2010-03-01 LAB — GLUCOSE, CAPILLARY: Glucose-Capillary: 107 mg/dL — ABNORMAL HIGH (ref 70–99)

## 2010-03-02 ENCOUNTER — Other Ambulatory Visit: Payer: Self-pay

## 2010-03-03 ENCOUNTER — Other Ambulatory Visit: Payer: Self-pay

## 2010-03-03 LAB — GLUCOSE, CAPILLARY: Glucose-Capillary: 201 mg/dL — ABNORMAL HIGH (ref 70–99)

## 2010-03-04 ENCOUNTER — Other Ambulatory Visit: Payer: Self-pay

## 2010-03-05 ENCOUNTER — Other Ambulatory Visit: Payer: Self-pay

## 2010-03-08 ENCOUNTER — Other Ambulatory Visit: Payer: Self-pay

## 2010-03-09 ENCOUNTER — Other Ambulatory Visit: Payer: Self-pay

## 2010-03-09 LAB — GLUCOSE, CAPILLARY
Glucose-Capillary: 199 mg/dL — ABNORMAL HIGH (ref 70–99)
Glucose-Capillary: 135 mg/dL — ABNORMAL HIGH (ref 70–99)
Glucose-Capillary: 121 mg/dL — ABNORMAL HIGH (ref 70–99)

## 2010-03-10 ENCOUNTER — Ambulatory Visit (INDEPENDENT_AMBULATORY_CARE_PROVIDER_SITE_OTHER): Payer: Self-pay | Admitting: Internal Medicine

## 2010-03-10 ENCOUNTER — Other Ambulatory Visit: Payer: Self-pay

## 2010-03-10 ENCOUNTER — Encounter: Payer: Self-pay | Admitting: Internal Medicine

## 2010-03-10 VITALS — BP 100/51 | HR 91 | Temp 97.0°F | Ht 69.0 in | Wt 216.2 lb

## 2010-03-10 DIAGNOSIS — L97409 Non-pressure chronic ulcer of unspecified heel and midfoot with unspecified severity: Secondary | ICD-10-CM

## 2010-03-10 DIAGNOSIS — I10 Essential (primary) hypertension: Secondary | ICD-10-CM

## 2010-03-10 DIAGNOSIS — L97529 Non-pressure chronic ulcer of other part of left foot with unspecified severity: Secondary | ICD-10-CM

## 2010-03-10 DIAGNOSIS — E119 Type 2 diabetes mellitus without complications: Secondary | ICD-10-CM

## 2010-03-10 MED ORDER — METFORMIN HCL 1000 MG PO TABS: 1000.0000 mg | ORAL_TABLET | Freq: Every day | ORAL | Status: DC

## 2010-03-10 MED ORDER — LISINOPRIL 5 MG PO TABS: 5.0000 mg | ORAL_TABLET | Freq: Every day | ORAL | Status: DC

## 2010-03-10 MED ORDER — DOXYCYCLINE HYCLATE 100 MG PO TABS: 100.0000 mg | ORAL_TABLET | Freq: Two times a day (BID) | ORAL | Status: AC

## 2010-03-10 MED ORDER — ASPIRIN 81 MG PO TABS: 81.0000 mg | ORAL_TABLET | Freq: Every day | ORAL | Status: AC

## 2010-03-10 MED ORDER — SULFAMETHOXAZOLE-TRIMETHOPRIM 800-160 MG PO TABS: 1.0000 | ORAL_TABLET | Freq: Two times a day (BID) | ORAL | Status: AC

## 2010-03-10 NOTE — Progress Notes (Signed)
  Subjective:    Patient ID: [name], female    DOB: Dec 01, 1955, 55 y.o.   MRN: [id number]  HPI  The patient is a 55 year old woman with history of type 2 diabetes and hypertension who is here for blood pressure check. Patient's last clinic visit was on February 15, 2010 when she had come in with a chief complaint of hypotension. The patient was previously on hydrochlorothiazide 25 mg daily as well as lisinopril 40 mg daily which was subsequently reduced to 10 mg daily. Since her last visit patient states she's only been taking her lisinopril 10 mg daily but on occasion continues to experience some dizziness and lightheadedness.   She continues to be seen weekly at the wound care clinic for hyperbaric treatments for her left foot ulcer. Patient states this has been healing well and she's currently on Bactrim and doxycycline that is being prescribed by the wound care clinic doctors.  Review of Systems  Constitutional: Positive for fatigue. Negative for fever and unexpected weight change.  Respiratory: Negative for shortness of breath.   Cardiovascular: Negative for chest pain and leg swelling.  Genitourinary: Negative for dysuria.  Neurological: Positive for light-headedness.      Objective:   Physical Exam  Constitutional: She is oriented to person, place, and time. She appears well-developed and well-nourished. No distress.  Cardiovascular: Normal rate, regular rhythm and normal heart sounds.  Exam reveals no gallop and no friction rub.   No murmur heard. Pulmonary/Chest: Effort normal and breath sounds normal. No respiratory distress. She has no wheezes. She has no rales.  Abdominal: Soft. Bowel sounds are normal. There is no tenderness.  Musculoskeletal: Normal range of motion.  Neurological: She is alert and oriented to person, place, and time.  Psychiatric: She has a normal mood and affect.      Assessment & Plan:

## 2010-03-10 NOTE — Assessment & Plan Note (Addendum)
Manual recheck of the patient's blood pressure was 100/51.  However the patient continues to be symptomatic in terms of her lightheadedness. Will decrease her lisinopril from 10 mg to 5 mg.  She is to return to clinic in one month for repeat blood pressure check.  If she remains hypotensive, patient may actually require workup for hypotension. Consider checking a TSH at that appointment versus discontinuing her blood pressure medications altogether.

## 2010-03-10 NOTE — Patient Instructions (Addendum)
Please return to the clinic in about 1 month for a blood pressure check. Today I am reducing your Lisinopril to 5mg  daily.  Please call the clinic if you have any questions or concerns.

## 2010-03-10 NOTE — Assessment & Plan Note (Signed)
This is a well-controlled on metformin. Hemoglobin A1c 5.7 in December 2011.  She checks her CBGs on a daily basis and has been getting blood glucose levels in the 150s.  Will not make any changes in her medication regimen today.

## 2010-03-11 ENCOUNTER — Other Ambulatory Visit: Payer: Self-pay

## 2010-03-12 ENCOUNTER — Other Ambulatory Visit: Payer: Self-pay

## 2010-03-12 ENCOUNTER — Telehealth: Payer: Self-pay | Admitting: Licensed Clinical Social Worker

## 2010-03-12 DIAGNOSIS — Z0271 Encounter for disability determination: Secondary | ICD-10-CM

## 2010-03-12 LAB — GLUCOSE, CAPILLARY
Glucose-Capillary: 126 mg/dL — ABNORMAL HIGH (ref 70–99)
Glucose-Capillary: 135 mg/dL — ABNORMAL HIGH (ref 70–99)

## 2010-03-12 NOTE — Telephone Encounter (Signed)
Thanks for the heads up Lupita Leash.  Let me know if there is anything you need from my end.  Thayer Ohm

## 2010-03-12 NOTE — Telephone Encounter (Signed)
Patient has called SW looking for assist with housing and disability.  Will see her Tuesday February 28th at 11:30 AM.

## 2010-03-15 ENCOUNTER — Other Ambulatory Visit: Payer: Self-pay

## 2010-03-16 ENCOUNTER — Other Ambulatory Visit: Payer: Self-pay

## 2010-03-16 ENCOUNTER — Encounter: Payer: Self-pay | Admitting: Licensed Clinical Social Worker

## 2010-03-16 LAB — GLUCOSE, CAPILLARY: Glucose-Capillary: 182 mg/dL — ABNORMAL HIGH (ref 70–99)

## 2010-03-16 NOTE — Progress Notes (Signed)
60 minutes. Patient was referred to social work by staff due to lack of permanent housing and diabetes with wound complications. Met with patient in my office for assessment, counseling, and resource referral and established relationship.   The patient reports to me that she is living with a friend and needs permanent housing. She has been in daily outpatient hyperbaric wound care for her left foot ulcer and will continue her treatments until mid March. 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 She has a BA in Banker behavior from [employer].   The patient is already certified for [employer] for one year. She is also certified through [place] and has the orange card.   She has applied for disability and been denied.  However she is pursuing reconsideration.   The patient is an only child and lost her father in May and was his caregiver until he died.    Today I educated her about the various resources that may be available to her.  Resources shared included [facility], [employer], and disability advocate, Kathyrn Lass.  I've instructed her to visit with the [employer] to see if they might have any housing based on her current medical condition and keep in contact with that agency.   Advised the patient that there appears to be opportunities for rehabilitation and she should pursue Voc Rehab and not depend on Disability benefits. However, I have suggested that she still pursue disability while exploring possible employment opportunities.

## 2010-03-17 ENCOUNTER — Other Ambulatory Visit: Payer: Self-pay

## 2010-03-18 ENCOUNTER — Other Ambulatory Visit: Payer: Self-pay

## 2010-03-18 ENCOUNTER — Encounter (HOSPITAL_BASED_OUTPATIENT_CLINIC_OR_DEPARTMENT_OTHER): Payer: Self-pay | Attending: General Surgery

## 2010-03-18 DIAGNOSIS — E1169 Type 2 diabetes mellitus with other specified complication: Secondary | ICD-10-CM | POA: Insufficient documentation

## 2010-03-18 DIAGNOSIS — M7989 Other specified soft tissue disorders: Secondary | ICD-10-CM

## 2010-03-18 DIAGNOSIS — L97509 Non-pressure chronic ulcer of other part of unspecified foot with unspecified severity: Secondary | ICD-10-CM | POA: Insufficient documentation

## 2010-03-18 DIAGNOSIS — I10 Essential (primary) hypertension: Secondary | ICD-10-CM | POA: Insufficient documentation

## 2010-03-18 LAB — GLUCOSE, CAPILLARY
Glucose-Capillary: 198 mg/dL — ABNORMAL HIGH (ref 70–99)
Glucose-Capillary: 161 mg/dL — ABNORMAL HIGH (ref 70–99)

## 2010-03-19 ENCOUNTER — Other Ambulatory Visit: Payer: Self-pay

## 2010-03-22 ENCOUNTER — Other Ambulatory Visit: Payer: Self-pay

## 2010-03-23 ENCOUNTER — Other Ambulatory Visit: Payer: Self-pay

## 2010-03-23 LAB — GLUCOSE, CAPILLARY
Glucose-Capillary: 160 mg/dL — ABNORMAL HIGH (ref 70–99)
Glucose-Capillary: 127 mg/dL — ABNORMAL HIGH (ref 70–99)

## 2010-03-24 ENCOUNTER — Other Ambulatory Visit: Payer: Self-pay

## 2010-03-24 ENCOUNTER — Encounter (HOSPITAL_BASED_OUTPATIENT_CLINIC_OR_DEPARTMENT_OTHER): Payer: Self-pay

## 2010-03-24 LAB — GLUCOSE, CAPILLARY
Glucose-Capillary: 177 mg/dL — ABNORMAL HIGH (ref 70–99)
Glucose-Capillary: 116 mg/dL — ABNORMAL HIGH (ref 70–99)

## 2010-03-25 ENCOUNTER — Other Ambulatory Visit: Payer: Self-pay

## 2010-03-25 LAB — GLUCOSE, CAPILLARY: Glucose-Capillary: 157 mg/dL — ABNORMAL HIGH (ref 70–99)

## 2010-03-26 ENCOUNTER — Other Ambulatory Visit: Payer: Self-pay

## 2010-03-29 ENCOUNTER — Other Ambulatory Visit: Payer: Self-pay

## 2010-03-29 LAB — COMPREHENSIVE METABOLIC PANEL
BUN: 12 mg/dL (ref 6–23)
CO2: 27 mEq/L (ref 19–32)
Calcium: 8.9 mg/dL (ref 8.4–10.5)
Chloride: 107 mEq/L (ref 96–112)
Creatinine, Ser: 1.02 mg/dL (ref 0.4–1.2)
GFR calc Af Amer: >60 mL/min (ref >60)
GFR calc non Af Amer: 56 mL/min — ABNORMAL LOW (ref >60)
Glucose, Bld: 76 mg/dL (ref 70–99)
Total Bilirubin: 0.5 mg/dL (ref 0.3–1.2)

## 2010-03-29 LAB — GLUCOSE, CAPILLARY
Glucose-Capillary: 121 mg/dL — ABNORMAL HIGH (ref 70–99)
Glucose-Capillary: 182 mg/dL — ABNORMAL HIGH (ref 70–99)
Glucose-Capillary: 101 mg/dL — ABNORMAL HIGH (ref 70–99)
Glucose-Capillary: 104 mg/dL — ABNORMAL HIGH (ref 70–99)
Glucose-Capillary: 115 mg/dL — ABNORMAL HIGH (ref 70–99)
Glucose-Capillary: 119 mg/dL — ABNORMAL HIGH (ref 70–99)
Glucose-Capillary: 125 mg/dL — ABNORMAL HIGH (ref 70–99)
Glucose-Capillary: 136 mg/dL — ABNORMAL HIGH (ref 70–99)
Glucose-Capillary: 140 mg/dL — ABNORMAL HIGH (ref 70–99)
Glucose-Capillary: 147 mg/dL — ABNORMAL HIGH (ref 70–99)
Glucose-Capillary: 151 mg/dL — ABNORMAL HIGH (ref 70–99)
Glucose-Capillary: 158 mg/dL — ABNORMAL HIGH (ref 70–99)
Glucose-Capillary: 160 mg/dL — ABNORMAL HIGH (ref 70–99)
Glucose-Capillary: 73 mg/dL (ref 70–99)
Glucose-Capillary: 85 mg/dL (ref 70–99)

## 2010-03-29 LAB — URINE MICROSCOPIC-ADD ON

## 2010-03-29 LAB — ANAEROBIC CULTURE

## 2010-03-29 LAB — BASIC METABOLIC PANEL
BUN: 13 mg/dL (ref 6–23)
BUN: 5 mg/dL — ABNORMAL LOW (ref 6–23)
BUN: 8 mg/dL (ref 6–23)
BUN: 9 mg/dL (ref 6–23)
CO2: 30 mEq/L (ref 19–32)
Chloride: 105 mEq/L (ref 96–112)
Creatinine, Ser: 0.67 mg/dL (ref 0.4–1.2)
Creatinine, Ser: 0.88 mg/dL (ref 0.4–1.2)
Creatinine, Ser: 0.91 mg/dL (ref 0.4–1.2)
Creatinine, Ser: 0.96 mg/dL (ref 0.4–1.2)
GFR calc Af Amer: >60 mL/min (ref >60)
GFR calc Af Amer: >60 mL/min (ref >60)
GFR calc non Af Amer: >60 mL/min (ref >60)
GFR calc non Af Amer: >60 mL/min (ref >60)
GFR calc non Af Amer: >60 mL/min (ref >60)
GFR calc non Af Amer: >60 mL/min (ref >60)
GFR calc non Af Amer: >60 mL/min (ref >60)
Glucose, Bld: 101 mg/dL — ABNORMAL HIGH (ref 70–99)
Glucose, Bld: 133 mg/dL — ABNORMAL HIGH (ref 70–99)
Potassium: 4.1 mEq/L (ref 3.5–5.1)
Potassium: 4.3 mEq/L (ref 3.5–5.1)
Potassium: 4.7 mEq/L (ref 3.5–5.1)
Sodium: 140 mEq/L (ref 135–145)

## 2010-03-29 LAB — CBC
HCT: 23.5 % — ABNORMAL LOW (ref 36.0–46.0)
HCT: 25.8 % — ABNORMAL LOW (ref 36.0–46.0)
HCT: 26.2 % — ABNORMAL LOW (ref 36.0–46.0)
Hemoglobin: 8 g/dL — ABNORMAL LOW (ref 12.0–15.0)
Hemoglobin: 8.5 g/dL — ABNORMAL LOW (ref 12.0–15.0)
Hemoglobin: 8.6 g/dL — ABNORMAL LOW (ref 12.0–15.0)
MCH: 27.1 pg (ref 26.0–34.0)
MCH: 27.6 pg (ref 26.0–34.0)
MCHC: 32.7 g/dL (ref 30.0–36.0)
MCHC: 32.8 g/dL (ref 30.0–36.0)
MCHC: 32.9 g/dL (ref 30.0–36.0)
MCHC: 33.2 g/dL (ref 30.0–36.0)
MCV: 82.9 fL (ref 78.0–100.0)
MCV: 83.1 fL (ref 78.0–100.0)
MCV: 83.7 fL (ref 78.0–100.0)
MCV: 83.8 fL (ref 78.0–100.0)
Platelets: 250 10*3/uL (ref 150–400)
Platelets: 264 10*3/uL (ref 150–400)
Platelets: 282 10*3/uL (ref 150–400)
Platelets: 296 10*3/uL (ref 150–400)
RBC: 2.95 MIL/uL — ABNORMAL LOW (ref 3.87–5.11)
RBC: 2.99 MIL/uL — ABNORMAL LOW (ref 3.87–5.11)
RBC: 3.14 MIL/uL — ABNORMAL LOW (ref 3.87–5.11)
RDW: 14.8 % (ref 11.5–15.5)
RDW: 14.8 % (ref 11.5–15.5)
RDW: 14.9 % (ref 11.5–15.5)
RDW: 15 % (ref 11.5–15.5)
WBC: 12.4 10*3/uL — ABNORMAL HIGH (ref 4.0–10.5)
WBC: 7.6 10*3/uL (ref 4.0–10.5)
WBC: 8.4 10*3/uL (ref 4.0–10.5)
WBC: 8.9 10*3/uL (ref 4.0–10.5)

## 2010-03-29 LAB — IRON AND TIBC
Iron: 11 ug/dL — ABNORMAL LOW (ref 42–135)
UIBC: 171 ug/dL

## 2010-03-29 LAB — DIFFERENTIAL
Basophils Absolute: 0 10*3/uL (ref 0.0–0.1)
Basophils Absolute: 0.1 10*3/uL (ref 0.0–0.1)
Eosinophils Absolute: 0.2 10*3/uL (ref 0.0–0.7)
Eosinophils Relative: 3 % (ref 0–5)
Lymphocytes Relative: 19 % (ref 12–46)
Lymphocytes Relative: 20 % (ref 12–46)
Lymphs Abs: 2 10*3/uL (ref 0.7–4.0)
Lymphs Abs: 2.5 10*3/uL (ref 0.7–4.0)
Monocytes Relative: 7 % (ref 3–12)
Neutro Abs: 7.1 10*3/uL (ref 1.7–7.7)
Neutro Abs: 8.8 10*3/uL — ABNORMAL HIGH (ref 1.7–7.7)
Neutrophils Relative %: 68 % (ref 43–77)

## 2010-03-29 LAB — URINALYSIS, ROUTINE W REFLEX MICROSCOPIC
Bilirubin Urine: NEGATIVE
Glucose, UA: NEGATIVE mg/dL
Ketones, ur: NEGATIVE mg/dL
Leukocytes, UA: NEGATIVE
Nitrite: NEGATIVE
Protein, ur: NEGATIVE mg/dL
Specific Gravity, Urine: 1.005 (ref 1.005–1.030)
Urobilinogen, UA: 0.2 mg/dL (ref 0.0–1.0)
pH: 6.5 (ref 5.0–8.0)

## 2010-03-29 LAB — PROTIME-INR
INR: 1.23 (ref 0.00–1.49)
Prothrombin Time: 15.7 seconds — ABNORMAL HIGH (ref 11.6–15.2)

## 2010-03-29 LAB — FERRITIN: Ferritin: 177 ng/mL (ref 10–291)

## 2010-03-29 LAB — TYPE AND SCREEN
ABO/RH(D): A POS
Antibody Screen: NEGATIVE

## 2010-03-29 LAB — CULTURE, ROUTINE-ABSCESS: Culture: NO GROWTH

## 2010-03-29 LAB — FOLATE: Folate: >20 ng/mL

## 2010-03-29 LAB — APTT: aPTT: 36 seconds (ref 24–37)

## 2010-03-30 ENCOUNTER — Other Ambulatory Visit: Payer: Self-pay

## 2010-03-30 LAB — GLUCOSE, CAPILLARY
Glucose-Capillary: 119 mg/dL — ABNORMAL HIGH (ref 70–99)
Glucose-Capillary: 139 mg/dL — ABNORMAL HIGH (ref 70–99)
Glucose-Capillary: 61 mg/dL — ABNORMAL LOW (ref 70–99)
Glucose-Capillary: 70 mg/dL (ref 70–99)

## 2010-03-31 ENCOUNTER — Other Ambulatory Visit: Payer: Self-pay

## 2010-03-31 LAB — GLUCOSE, CAPILLARY
Glucose-Capillary: 116 mg/dL — ABNORMAL HIGH (ref 70–99)
Glucose-Capillary: 135 mg/dL — ABNORMAL HIGH (ref 70–99)
Glucose-Capillary: 104 mg/dL — ABNORMAL HIGH (ref 70–99)

## 2010-04-01 LAB — GLUCOSE, CAPILLARY: Glucose-Capillary: 188 mg/dL — ABNORMAL HIGH (ref 70–99)

## 2010-04-03 LAB — GLUCOSE, CAPILLARY: Glucose-Capillary: 129 mg/dL — ABNORMAL HIGH (ref 70–99)

## 2010-04-05 LAB — GLUCOSE, CAPILLARY
Glucose-Capillary: 173 mg/dL — ABNORMAL HIGH (ref 70–99)
Glucose-Capillary: 183 mg/dL — ABNORMAL HIGH (ref 70–99)
Glucose-Capillary: 222 mg/dL — ABNORMAL HIGH (ref 70–99)
Glucose-Capillary: 232 mg/dL — ABNORMAL HIGH (ref 70–99)
Glucose-Capillary: 283 mg/dL — ABNORMAL HIGH (ref 70–99)
Glucose-Capillary: 319 mg/dL — ABNORMAL HIGH (ref 70–99)
Glucose-Capillary: 391 mg/dL — ABNORMAL HIGH (ref 70–99)
Glucose-Capillary: 450 mg/dL — ABNORMAL HIGH (ref 70–99)

## 2010-04-05 LAB — DIFFERENTIAL
Basophils Absolute: 0 10*3/uL (ref 0.0–0.1)
Basophils Absolute: 0 K/uL (ref 0.0–0.1)
Basophils Relative: 0 % (ref 0–1)
Eosinophils Absolute: 0.2 10*3/uL (ref 0.0–0.7)
Eosinophils Relative: 2 % (ref 0–5)
Eosinophils Relative: 3 % (ref 0–5)
Lymphocytes Relative: 30 % (ref 12–46)
Lymphocytes Relative: 31 % (ref 12–46)
Lymphs Abs: 2.4 10*3/uL (ref 0.7–4.0)
Monocytes Absolute: 0.5 10*3/uL (ref 0.1–1.0)
Monocytes Absolute: 0.6 10*3/uL (ref 0.1–1.0)
Monocytes Relative: 7 % (ref 3–12)
Monocytes Relative: 8 % (ref 3–12)
Neutro Abs: 4.8 10*3/uL (ref 1.7–7.7)
Neutrophils Relative %: 60 % (ref 43–77)

## 2010-04-05 LAB — CBC
HCT: 34.3 % — ABNORMAL LOW (ref 36.0–46.0)
HCT: 36.9 % (ref 36.0–46.0)
Hemoglobin: 11.7 g/dL — ABNORMAL LOW (ref 12.0–15.0)
Hemoglobin: 12.4 g/dL (ref 12.0–15.0)
MCHC: 34.1 g/dL (ref 30.0–36.0)
MCV: 87.5 fL (ref 78.0–100.0)
Platelets: 179 K/uL (ref 150–400)
RBC: 3.92 MIL/uL (ref 3.87–5.11)
RDW: 13.9 % (ref 11.5–15.5)
RDW: 14 % (ref 11.5–15.5)
WBC: 8 10*3/uL (ref 4.0–10.5)

## 2010-04-05 LAB — POCT I-STAT, CHEM 8
Chloride: 105 mEq/L (ref 96–112)
Glucose, Bld: 168 mg/dL — ABNORMAL HIGH (ref 70–99)
HCT: 36 % (ref 36.0–46.0)
Potassium: 4.5 mEq/L (ref 3.5–5.1)
Sodium: 137 mEq/L (ref 135–145)

## 2010-04-05 LAB — BASIC METABOLIC PANEL WITH GFR
BUN: 16 mg/dL (ref 6–23)
CO2: 30 meq/L (ref 19–32)
Calcium: 9.3 mg/dL (ref 8.4–10.5)
GFR calc non Af Amer: >60 mL/min (ref >60)
Glucose, Bld: 405 mg/dL — ABNORMAL HIGH (ref 70–99)
Potassium: 3.8 meq/L (ref 3.5–5.1)

## 2010-04-05 LAB — CULTURE, BLOOD (ROUTINE X 2): Culture: NO GROWTH

## 2010-04-05 LAB — BASIC METABOLIC PANEL
Chloride: 95 mEq/L — ABNORMAL LOW (ref 96–112)
Creatinine, Ser: 0.89 mg/dL (ref 0.4–1.2)
GFR calc Af Amer: >60 mL/min (ref >60)
Sodium: 131 mEq/L — ABNORMAL LOW (ref 135–145)

## 2010-04-05 LAB — HEPATIC FUNCTION PANEL
ALT: 8 U/L (ref 0–35)
AST: 17 U/L (ref 0–37)
Indirect Bilirubin: 0.3 mg/dL (ref 0.3–0.9)
Total Protein: 6.8 g/dL (ref 6.0–8.3)

## 2010-04-07 ENCOUNTER — Ambulatory Visit (INDEPENDENT_AMBULATORY_CARE_PROVIDER_SITE_OTHER): Payer: Self-pay | Admitting: Internal Medicine

## 2010-04-07 VITALS — BP 123/75 | HR 84 | Temp 98.0°F | Wt 214.2 lb

## 2010-04-07 DIAGNOSIS — L97909 Non-pressure chronic ulcer of unspecified part of unspecified lower leg with unspecified severity: Secondary | ICD-10-CM

## 2010-04-07 DIAGNOSIS — E119 Type 2 diabetes mellitus without complications: Secondary | ICD-10-CM

## 2010-04-07 DIAGNOSIS — I10 Essential (primary) hypertension: Secondary | ICD-10-CM

## 2010-04-07 LAB — BASIC METABOLIC PANEL
BUN: 30 mg/dL — ABNORMAL HIGH (ref 6–23)
Calcium: 9.8 mg/dL (ref 8.4–10.5)
Creat: 1.33 mg/dL — ABNORMAL HIGH (ref 0.40–1.20)

## 2010-04-07 LAB — POCT GLYCOSYLATED HEMOGLOBIN (HGB A1C): Hemoglobin A1C: 6.9

## 2010-04-07 NOTE — Progress Notes (Signed)
  Subjective:    Patient ID: [name], female    DOB: [birth year], 55 y.o.   MRN: [id number]  HPI Patient is a 55 years old female with past medical history  as outlined here who comes to the Clinic for regular f/u of her BP.  Recently her BP has been soft about 90s-100s and HCTZ was stopped, lisinopril is decreased from 40 mg to 5 mg for now. About 3 weeks ago her BP100/51, she has been taking lisinopril 5 mg, occasionally has some dizziness on standing. Has no fever, chill, chest pain, shortness of breath, hemoptysis, abdominal pain, nausea, vomiting, diarrhea, melena, dysuria, significant weight change. Denies recent smoking, alcohol or drug abuse. Has been taking all his medications as instructed.  Her left foot ulcer has been f/u by wound clinic and she said the wound healing better, no drainage.    Review of Systems Per HPI.  Current Outpatient Medications Current Outpatient Prescriptions  Medication Sig Dispense Refill  . aspirin 81 MG tablet Take 1 tablet (81 mg total) by mouth daily.  30 tablet  11  . ferrous sulfate 325 (65 FE) MG tablet Take 325 mg by mouth daily. With orange juice       . lisinopril (PRINIVIL,ZESTRIL) 5 MG tablet Take 1 tablet (5 mg total) by mouth daily.  30 tablet  3  . metFORMIN (GLUCOPHAGE) 1000 MG tablet Take 1 tablet (1,000 mg total) by mouth daily with breakfast.  60 tablet  6  . Multiple Vitamin (MULTIVITAMIN) tablet Take 1 tablet by mouth daily.          Allergies Codeine  Past Medical History  Diagnosis Date  . Normocytic anemia   . Hypertension   . Diabetes mellitus   . Foot ulcer, left   . Leg edema     Past Surgical History  Procedure Date  . Oophrectomy 1975  . Appendectomy        Objective:   Physical Exam General: Vital signs reviewed and noted. Well-developed,well-nourished,in no acute distress; alert,appropriate and cooperative throughout examination. Head: normocephalic, atraumatic. Neck: No deformities, masses, or  tenderness noted. Lungs: Normal respiratory effort. Clear to auscultation BL without crackles or wheezes.  Heart: RRR. S1 and S2 normal without gallop, murmur, or rubs.  Abdomen: BS normoactive. Soft, Nondistended, non-tender.  No masses or organomegaly. Extremities: Left pretibial pitting edema+, no erythema. Left foot was wrapped with dressing.         Assessment & Plan:

## 2010-04-07 NOTE — Assessment & Plan Note (Signed)
Lab Results  Component Value Date   NA 136 01/22/2010   K 5.1 01/22/2010   CL 101 01/22/2010   CO2 27 01/22/2010   BUN 36* 01/22/2010   CREATININE 0.94 01/22/2010    BP Readings from Last 3 Encounters:  04/07/10 123/75  03/10/10 100/51  02/15/10 112/73   Her BP well controlled on lisinopril 5 mg only and will continue this. Recheck BMET as her K 5.1. Her previous soft BP is likely due to medications or autonomic neuropathy. Also told her to stand up slowly to prevent fall.

## 2010-04-07 NOTE — Patient Instructions (Signed)
Please take all your medications as instructed in your instructions.   We will call you if any abnormal lab results.  Please stand up slowly to prevent fall.   Please bring your glucometer with you at next visit.

## 2010-04-07 NOTE — Assessment & Plan Note (Addendum)
No drainage and heals fine and has been f/u by wound care clinic and taking hyperbaric therapy.

## 2010-04-07 NOTE — Assessment & Plan Note (Addendum)
Her CBG well controlled and CBG <200, checked A1C 6.9. Will make eye appointment for her.   Lab Results  Component Value Date   HGBA1C 5.7 12/17/2009   CREATININE 0.94 01/22/2010   MICROALBUR 2.40* 06/17/2009   MICRALBCREAT 18.1 06/17/2009   CHOL 151 06/17/2009   HDL 46 06/17/2009   TRIG 105 06/17/2009    Last eye exam and foot exam:    Component Value Date/Time   HMDIABEYEEXA normal 12/29/2009   HMDIABFOOTEX done 12/29/2009

## 2010-04-12 ENCOUNTER — Other Ambulatory Visit: Payer: Self-pay

## 2010-04-13 ENCOUNTER — Other Ambulatory Visit: Payer: Self-pay

## 2010-04-14 ENCOUNTER — Other Ambulatory Visit: Payer: Self-pay

## 2010-04-14 LAB — GLUCOSE, CAPILLARY: Glucose-Capillary: 97 mg/dL (ref 70–99)

## 2010-04-15 ENCOUNTER — Other Ambulatory Visit: Payer: Self-pay

## 2010-04-16 ENCOUNTER — Other Ambulatory Visit: Payer: Self-pay

## 2010-04-19 ENCOUNTER — Other Ambulatory Visit (HOSPITAL_BASED_OUTPATIENT_CLINIC_OR_DEPARTMENT_OTHER): Payer: Self-pay | Admitting: General Surgery

## 2010-04-19 ENCOUNTER — Other Ambulatory Visit: Payer: Self-pay

## 2010-04-19 ENCOUNTER — Encounter (HOSPITAL_BASED_OUTPATIENT_CLINIC_OR_DEPARTMENT_OTHER): Payer: Self-pay | Attending: General Surgery

## 2010-04-19 DIAGNOSIS — I10 Essential (primary) hypertension: Secondary | ICD-10-CM | POA: Insufficient documentation

## 2010-04-19 DIAGNOSIS — L97509 Non-pressure chronic ulcer of other part of unspecified foot with unspecified severity: Secondary | ICD-10-CM | POA: Insufficient documentation

## 2010-04-19 DIAGNOSIS — E1169 Type 2 diabetes mellitus with other specified complication: Secondary | ICD-10-CM | POA: Insufficient documentation

## 2010-04-20 ENCOUNTER — Other Ambulatory Visit (HOSPITAL_BASED_OUTPATIENT_CLINIC_OR_DEPARTMENT_OTHER): Payer: Self-pay | Admitting: General Surgery

## 2010-04-20 ENCOUNTER — Other Ambulatory Visit: Payer: Self-pay

## 2010-04-20 LAB — GLUCOSE, CAPILLARY: Glucose-Capillary: 122 mg/dL — ABNORMAL HIGH (ref 70–99)

## 2010-04-21 ENCOUNTER — Other Ambulatory Visit (HOSPITAL_BASED_OUTPATIENT_CLINIC_OR_DEPARTMENT_OTHER): Payer: Self-pay | Admitting: General Surgery

## 2010-04-21 ENCOUNTER — Encounter (HOSPITAL_BASED_OUTPATIENT_CLINIC_OR_DEPARTMENT_OTHER): Payer: Self-pay

## 2010-04-21 LAB — GLUCOSE, CAPILLARY
Glucose-Capillary: 125 mg/dL — ABNORMAL HIGH (ref 70–99)
Glucose-Capillary: 152 mg/dL — ABNORMAL HIGH (ref 70–99)
Glucose-Capillary: 142 mg/dL — ABNORMAL HIGH (ref 70–99)
Glucose-Capillary: 125 mg/dL — ABNORMAL HIGH (ref 70–99)
Glucose-Capillary: 167 mg/dL — ABNORMAL HIGH (ref 70–99)
Glucose-Capillary: 122 mg/dL — ABNORMAL HIGH (ref 70–99)
Glucose-Capillary: 174 mg/dL — ABNORMAL HIGH (ref 70–99)
Glucose-Capillary: 117 mg/dL — ABNORMAL HIGH (ref 70–99)
Glucose-Capillary: 122 mg/dL — ABNORMAL HIGH (ref 70–99)

## 2010-04-22 ENCOUNTER — Other Ambulatory Visit (HOSPITAL_BASED_OUTPATIENT_CLINIC_OR_DEPARTMENT_OTHER): Payer: Self-pay | Admitting: General Surgery

## 2010-04-25 LAB — URINALYSIS, ROUTINE W REFLEX MICROSCOPIC
Glucose, UA: >1000 mg/dL — AB
Specific Gravity, Urine: 1.024 (ref 1.005–1.030)
Urobilinogen, UA: 0.2 mg/dL (ref 0.0–1.0)
pH: 5 (ref 5.0–8.0)

## 2010-04-25 LAB — URINE MICROSCOPIC-ADD ON

## 2010-04-26 ENCOUNTER — Other Ambulatory Visit (HOSPITAL_BASED_OUTPATIENT_CLINIC_OR_DEPARTMENT_OTHER): Payer: Self-pay | Admitting: General Surgery

## 2010-04-27 ENCOUNTER — Other Ambulatory Visit (HOSPITAL_BASED_OUTPATIENT_CLINIC_OR_DEPARTMENT_OTHER): Payer: Self-pay | Admitting: General Surgery

## 2010-04-28 ENCOUNTER — Other Ambulatory Visit (HOSPITAL_BASED_OUTPATIENT_CLINIC_OR_DEPARTMENT_OTHER): Payer: Self-pay | Admitting: General Surgery

## 2010-04-29 ENCOUNTER — Other Ambulatory Visit (HOSPITAL_BASED_OUTPATIENT_CLINIC_OR_DEPARTMENT_OTHER): Payer: Self-pay | Admitting: General Surgery

## 2010-04-29 LAB — GLUCOSE, CAPILLARY: Glucose-Capillary: 125 mg/dL — ABNORMAL HIGH (ref 70–99)

## 2010-04-30 ENCOUNTER — Other Ambulatory Visit (HOSPITAL_BASED_OUTPATIENT_CLINIC_OR_DEPARTMENT_OTHER): Payer: Self-pay | Admitting: General Surgery

## 2010-04-30 LAB — GLUCOSE, CAPILLARY: Glucose-Capillary: 85 mg/dL (ref 70–99)

## 2010-05-03 ENCOUNTER — Other Ambulatory Visit (HOSPITAL_BASED_OUTPATIENT_CLINIC_OR_DEPARTMENT_OTHER): Payer: Self-pay | Admitting: General Surgery

## 2010-05-03 LAB — GLUCOSE, CAPILLARY: Glucose-Capillary: 131 mg/dL — ABNORMAL HIGH (ref 70–99)

## 2010-05-04 ENCOUNTER — Other Ambulatory Visit (HOSPITAL_BASED_OUTPATIENT_CLINIC_OR_DEPARTMENT_OTHER): Payer: Self-pay | Admitting: General Surgery

## 2010-05-04 LAB — GLUCOSE, CAPILLARY: Glucose-Capillary: 181 mg/dL — ABNORMAL HIGH (ref 70–99)

## 2010-05-05 ENCOUNTER — Other Ambulatory Visit (HOSPITAL_BASED_OUTPATIENT_CLINIC_OR_DEPARTMENT_OTHER): Payer: Self-pay | Admitting: General Surgery

## 2010-05-05 LAB — GLUCOSE, CAPILLARY: Glucose-Capillary: 157 mg/dL — ABNORMAL HIGH (ref 70–99)

## 2010-05-06 ENCOUNTER — Other Ambulatory Visit (HOSPITAL_BASED_OUTPATIENT_CLINIC_OR_DEPARTMENT_OTHER): Payer: Self-pay | Admitting: General Surgery

## 2010-05-06 LAB — GLUCOSE, CAPILLARY
Glucose-Capillary: 139 mg/dL — ABNORMAL HIGH (ref 70–99)
Glucose-Capillary: 131 mg/dL — ABNORMAL HIGH (ref 70–99)

## 2010-05-07 ENCOUNTER — Other Ambulatory Visit (HOSPITAL_BASED_OUTPATIENT_CLINIC_OR_DEPARTMENT_OTHER): Payer: Self-pay | Admitting: General Surgery

## 2010-05-07 LAB — GLUCOSE, CAPILLARY
Glucose-Capillary: 117 mg/dL — ABNORMAL HIGH (ref 70–99)
Glucose-Capillary: 158 mg/dL — ABNORMAL HIGH (ref 70–99)

## 2010-05-10 ENCOUNTER — Other Ambulatory Visit (HOSPITAL_BASED_OUTPATIENT_CLINIC_OR_DEPARTMENT_OTHER): Payer: Self-pay | Admitting: General Surgery

## 2010-05-10 LAB — GLUCOSE, CAPILLARY: Glucose-Capillary: 160 mg/dL — ABNORMAL HIGH (ref 70–99)

## 2010-05-11 ENCOUNTER — Other Ambulatory Visit (HOSPITAL_BASED_OUTPATIENT_CLINIC_OR_DEPARTMENT_OTHER): Payer: Self-pay | Admitting: General Surgery

## 2010-05-11 LAB — GLUCOSE, CAPILLARY: Glucose-Capillary: 140 mg/dL — ABNORMAL HIGH (ref 70–99)

## 2010-05-12 ENCOUNTER — Other Ambulatory Visit (HOSPITAL_BASED_OUTPATIENT_CLINIC_OR_DEPARTMENT_OTHER): Payer: Self-pay | Admitting: General Surgery

## 2010-05-14 ENCOUNTER — Other Ambulatory Visit (HOSPITAL_BASED_OUTPATIENT_CLINIC_OR_DEPARTMENT_OTHER): Payer: Self-pay | Admitting: General Surgery

## 2010-05-14 LAB — GLUCOSE, CAPILLARY
Glucose-Capillary: 130 mg/dL — ABNORMAL HIGH (ref 70–99)
Glucose-Capillary: 138 mg/dL — ABNORMAL HIGH (ref 70–99)

## 2010-05-17 ENCOUNTER — Other Ambulatory Visit (HOSPITAL_BASED_OUTPATIENT_CLINIC_OR_DEPARTMENT_OTHER): Payer: Self-pay | Admitting: General Surgery

## 2010-05-18 ENCOUNTER — Other Ambulatory Visit (HOSPITAL_BASED_OUTPATIENT_CLINIC_OR_DEPARTMENT_OTHER): Payer: Self-pay | Admitting: Internal Medicine

## 2010-05-18 ENCOUNTER — Encounter (HOSPITAL_BASED_OUTPATIENT_CLINIC_OR_DEPARTMENT_OTHER): Payer: Self-pay | Attending: Internal Medicine

## 2010-05-18 DIAGNOSIS — L97509 Non-pressure chronic ulcer of other part of unspecified foot with unspecified severity: Secondary | ICD-10-CM | POA: Insufficient documentation

## 2010-05-18 DIAGNOSIS — Z79899 Other long term (current) drug therapy: Secondary | ICD-10-CM | POA: Insufficient documentation

## 2010-05-18 DIAGNOSIS — E1169 Type 2 diabetes mellitus with other specified complication: Secondary | ICD-10-CM | POA: Insufficient documentation

## 2010-05-18 DIAGNOSIS — I10 Essential (primary) hypertension: Secondary | ICD-10-CM | POA: Insufficient documentation

## 2010-05-18 DIAGNOSIS — I739 Peripheral vascular disease, unspecified: Secondary | ICD-10-CM | POA: Insufficient documentation

## 2010-05-18 LAB — GLUCOSE, CAPILLARY: Glucose-Capillary: 118 mg/dL — ABNORMAL HIGH (ref 70–99)

## 2010-05-19 ENCOUNTER — Other Ambulatory Visit (HOSPITAL_BASED_OUTPATIENT_CLINIC_OR_DEPARTMENT_OTHER): Payer: Self-pay | Admitting: Internal Medicine

## 2010-05-19 LAB — GLUCOSE, CAPILLARY
Glucose-Capillary: 125 mg/dL — ABNORMAL HIGH (ref 70–99)
Glucose-Capillary: 137 mg/dL — ABNORMAL HIGH (ref 70–99)

## 2010-05-20 ENCOUNTER — Other Ambulatory Visit (HOSPITAL_BASED_OUTPATIENT_CLINIC_OR_DEPARTMENT_OTHER): Payer: Self-pay | Admitting: Internal Medicine

## 2010-05-20 LAB — GLUCOSE, CAPILLARY
Glucose-Capillary: 171 mg/dL — ABNORMAL HIGH (ref 70–99)
Glucose-Capillary: 106 mg/dL — ABNORMAL HIGH (ref 70–99)

## 2010-05-21 ENCOUNTER — Other Ambulatory Visit (HOSPITAL_BASED_OUTPATIENT_CLINIC_OR_DEPARTMENT_OTHER): Payer: Self-pay | Admitting: Internal Medicine

## 2010-05-21 LAB — GLUCOSE, CAPILLARY
Glucose-Capillary: 153 mg/dL — ABNORMAL HIGH (ref 70–99)
Glucose-Capillary: 113 mg/dL — ABNORMAL HIGH (ref 70–99)

## 2010-05-24 ENCOUNTER — Other Ambulatory Visit (HOSPITAL_BASED_OUTPATIENT_CLINIC_OR_DEPARTMENT_OTHER): Payer: Self-pay | Admitting: Internal Medicine

## 2010-05-24 LAB — GLUCOSE, CAPILLARY: Glucose-Capillary: 177 mg/dL — ABNORMAL HIGH (ref 70–99)

## 2010-05-25 ENCOUNTER — Other Ambulatory Visit (HOSPITAL_BASED_OUTPATIENT_CLINIC_OR_DEPARTMENT_OTHER): Payer: Self-pay | Admitting: Internal Medicine

## 2010-05-25 LAB — GLUCOSE, CAPILLARY
Glucose-Capillary: 176 mg/dL — ABNORMAL HIGH (ref 70–99)
Glucose-Capillary: 116 mg/dL — ABNORMAL HIGH (ref 70–99)

## 2010-05-26 ENCOUNTER — Other Ambulatory Visit (HOSPITAL_COMMUNITY)
Admission: RE | Admit: 2010-05-26 | Discharge: 2010-05-26 | Disposition: A | Discharge status: Home / Self Care | Payer: Self-pay | Source: Ambulatory Visit | Attending: Internal Medicine | Admitting: Internal Medicine

## 2010-05-26 ENCOUNTER — Encounter: Payer: Self-pay | Admitting: Internal Medicine

## 2010-05-26 ENCOUNTER — Other Ambulatory Visit (HOSPITAL_BASED_OUTPATIENT_CLINIC_OR_DEPARTMENT_OTHER): Payer: Self-pay | Admitting: Internal Medicine

## 2010-05-26 ENCOUNTER — Ambulatory Visit (INDEPENDENT_AMBULATORY_CARE_PROVIDER_SITE_OTHER): Payer: Self-pay | Admitting: Internal Medicine

## 2010-05-26 ENCOUNTER — Inpatient Hospital Stay: Admission: RE | Admit: 2010-05-26 | Payer: Self-pay | Source: Ambulatory Visit | Admitting: Internal Medicine

## 2010-05-26 DIAGNOSIS — Z1231 Encounter for screening mammogram for malignant neoplasm of breast: Secondary | ICD-10-CM | POA: Insufficient documentation

## 2010-05-26 DIAGNOSIS — L97909 Non-pressure chronic ulcer of unspecified part of unspecified lower leg with unspecified severity: Secondary | ICD-10-CM

## 2010-05-26 DIAGNOSIS — E119 Type 2 diabetes mellitus without complications: Secondary | ICD-10-CM

## 2010-05-26 DIAGNOSIS — Z01419 Encounter for gynecological examination (general) (routine) without abnormal findings: Secondary | ICD-10-CM | POA: Insufficient documentation

## 2010-05-26 DIAGNOSIS — Z0000 Encounter for general adult medical examination without abnormal findings: Secondary | ICD-10-CM | POA: Insufficient documentation

## 2010-05-26 DIAGNOSIS — I10 Essential (primary) hypertension: Secondary | ICD-10-CM

## 2010-05-26 DIAGNOSIS — G4733 Obstructive sleep apnea (adult) (pediatric): Secondary | ICD-10-CM

## 2010-05-26 DIAGNOSIS — R5381 Other malaise: Secondary | ICD-10-CM

## 2010-05-26 DIAGNOSIS — D649 Anemia, unspecified: Secondary | ICD-10-CM

## 2010-05-26 DIAGNOSIS — R5383 Other fatigue: Secondary | ICD-10-CM

## 2010-05-26 HISTORY — DX: Obstructive sleep apnea (adult) (pediatric): G47.33

## 2010-05-26 MED ORDER — SULFAMETHOXAZOLE-TRIMETHOPRIM 800-160 MG PO TABS: 1.0000 | ORAL_TABLET | Freq: Two times a day (BID) | ORAL | Status: AC

## 2010-05-26 MED ORDER — LISINOPRIL 5 MG PO TABS: 5.0000 mg | ORAL_TABLET | Freq: Every day | ORAL | Status: DC

## 2010-05-26 MED ORDER — CALCIUM CARBONATE-VITAMIN D 600-400 MG-UNIT PO CHEW: 1.0000 | CHEWABLE_TABLET | Freq: Two times a day (BID) | ORAL | Status: DC

## 2010-05-26 MED ORDER — METFORMIN HCL 1000 MG PO TABS: 1000.0000 mg | ORAL_TABLET | Freq: Every day | ORAL | Status: DC

## 2010-05-26 NOTE — Patient Instructions (Addendum)
Continue all your medications as prescribed.  Continue working with the Wound care center for taking care of your wound.  Please have them send [place] a copy of their notes for our records.    Please remember to come back in 2-3 weeks to have a fasting blood tests done.  If there is anything that comes up from either that or the PAP smear I will give you a call to discuss it.

## 2010-05-26 NOTE — Progress Notes (Signed)
Subjective:    Patient ID: [name], female    DOB: 1955/07/16, 55 y.o.   MRN: [id number]  HPI  Ms. Hole is a 55 year old woman who presents to clinic today for follow up of her chronic medical problems including hypertension, DM, anemia, and her chronic foot ulcer.  She has been taking her medications and denies any problems with side effects.  She has not had any recurrences of her hypotension since decreasing her lisinopril and HCTZ.    She has been followed by the Wound care center at [facility] for her leg wound.  She has been doing Hyperbaric therapy daily and her wound is healing well.  She continues on Bactrim by the wound care doctor.    She has noticed that she has had problems with some numbness and cold sensation in her hands.  Usually she has the problems after coming out of the hyperbaric chamber but has noticed it at times throughout the day.  She has not noticed any increase in the symptoms with computer work or when she is sitting with her elbows on a table.  She denies any weakness, loss of coordination, or sleep disturbance from the pain.    She continues to also have fatigue that has been in place since her foot wound.  She has noticed some thinning of her hair but no weight gain, cold intolerance, edema, or thyromegaly.  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  She is also in need of some preventative health items including Pap smear as well as urine microalbumin and fasting lipid panel for her diabetes follow up.  Review of Systems    Constitutional: Positive for Fatigue and decreased appetite.  Denies fever, chills, or diaphoresis HEENT: Denies photophobia, eye pain, redness, hearing loss, ear pain, congestion, sore throat, rhinorrhea, sneezing, mouth sores, trouble swallowing, neck pain, neck stiffness and tinnitus.   Respiratory: Denies SOB, DOE, cough, chest  tightness,  and wheezing.   Cardiovascular: Denies chest pain, palpitations and leg swelling.  Gastrointestinal: Denies nausea, vomiting, abdominal pain, diarrhea, constipation, blood in stool and abdominal distention.  Genitourinary: Denies dysuria, urgency, frequency, hematuria, flank pain and difficulty urinating.  Musculoskeletal: Denies myalgias, back pain, joint swelling, arthralgias and gait problem.  Skin: Denies pallor, rash and wound.  Neurological: Denies dizziness, seizures, syncope, weakness, light-headedness, numbness and headaches.  Hematological: Denies adenopathy. Easy bruising, personal or family bleeding history  Psychiatric/Behavioral: Denies suicidal ideation, mood changes, confusion, nervousness, sleep disturbance and agitation  Objective:   Physical Exam    Constitutional: Vital signs reviewed.  Patient is a well-developed and well-nourished woman in no acute distress and cooperative with exam. Alert and oriented x3.  Head: Normocephalic and atraumatic Ear: TM normal bilaterally Mouth: no erythema or exudates, MMM Eyes: PERRL, EOMI, conjunctivae normal, No scleral icterus.  Neck: Supple, Trachea midline normal ROM, No JVD, mass, thyromegaly, or carotid bruit present.  Cardiovascular: RRR, S1 normal, S2 normal, no MRG, pulses symmetric and intact bilaterally Pulmonary/Chest: CTAB, no wheezes, rales, or rhonchi Abdominal: Soft. Non-tender, non-distended, bowel sounds are normal, no masses, organomegaly, or guarding present.  GU: no CVA tenderness Musculoskeletal: No joint deformities, erythema, or stiffness, ROM full and no nontender Extremities:  Small, healing ulceration on the left foot.   Hematology: no cervical, inginal, or axillary adenopathy.  Neurological: A&O x3, Strength is normal and symmetric bilaterally, cranial nerve II-XII  are grossly intact, no focal motor deficit, sensory intact to light touch bilaterally.   Bilateral Allen's test normal, tinel's sign  and median nerve compression tests are negative.  No tinel's sign over the ulner groove in the elbows bilaterally.  Mild difference in sensation over the 4th and 5th fingers bilaterally but no muscle wasting or weakness noted. Skin: Warm, dry and intact. No rash, cyanosis, or clubbing.  Psychiatric: Normal mood and affect. speech and behavior is normal. Judgment and thought content normal. Cognition and memory are normal.    Assessment & Plan:

## 2010-05-27 ENCOUNTER — Other Ambulatory Visit (HOSPITAL_BASED_OUTPATIENT_CLINIC_OR_DEPARTMENT_OTHER): Payer: Self-pay | Admitting: Internal Medicine

## 2010-05-27 LAB — GLUCOSE, CAPILLARY: Glucose-Capillary: 110 mg/dL — ABNORMAL HIGH (ref 70–99)

## 2010-05-27 LAB — MICROALBUMIN / CREATININE URINE RATIO
Creatinine, Urine: 52.1 mg/dL
Microalb Creat Ratio: 9.6 mg/g (ref 0.0–30.0)

## 2010-05-27 NOTE — Assessment & Plan Note (Addendum)
She is due for her PAP smear today.  She has not had an abnormal in the past so if this one is negative we could go to every 2 years for monitoring.  She has no new sexual partners.  She will also be due for mammogram in September and is up to date on all her other screening recommendations.

## 2010-05-27 NOTE — Assessment & Plan Note (Addendum)
Lab Results  Component Value Date   HGBA1C 6.9 04/07/2010   HGBA1C 5.7 12/17/2009   HGBA1C 6.1 10/08/2009   Lab Results  Component Value Date   MICROALBUR 0.50 05/26/2010   LDLCALC 84 06/17/2009   CREATININE 1.33* 04/07/2010   [name]'s diabetes is has been under good control even in the face of her wound.  The increase in her A1C is likely because of the hyperglycemia associated with her now healing ulcer.  She is currently on only metformin.  She is due for a few monitoring including her FLP and urine microalbumin/creatinine level.  We will get the urine study today but since she is not fasting we will get those done when she returns for a fasting blood draw.

## 2010-05-27 NOTE — Assessment & Plan Note (Signed)
We will follow her hemoglobin with the work up for her fatigue.  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

## 2010-05-27 NOTE — Assessment & Plan Note (Signed)
She is undergoing hyperbaric therapy at the Wound center and the ulcer appears to be healing well.  She has 2 more weeks of her therapy.  She is currently still on Bactrim prescribed by the doctors there so we will defer how long she is on the medication to them at this point.

## 2010-05-27 NOTE — Assessment & Plan Note (Signed)
>>  ASSESSMENT AND PLAN FOR NORMOCYTIC ANEMIA WRITTEN ON 05/27/2010  2:51 PM BY PRIBULA, CHRISTOPHER, MD  We will follow her hemoglobin with the work up for her fatigue.  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

## 2010-05-27 NOTE — Assessment & Plan Note (Signed)
[name] blood pressure is excellent today.  Her goal as a diabetic is <130/80.  She may need to be mildly titrated back up on her medications carefully when the healing and hyperbaric therapy is complete for her wound.   We will continue to monitor.

## 2010-05-27 NOTE — Assessment & Plan Note (Signed)
She states she is having problems with some fatigue.  No sleep disorder noted and she states that it is more that she tires easily.  She has a history of a normocytic anemia.  She has been tested a year ago for thyroid disorder that was normal at that time.  With all the changes in the last year we will recheck her TSH and free T4 as well as a CBC to start the investigation into her fatigue.

## 2010-05-28 ENCOUNTER — Other Ambulatory Visit (HOSPITAL_BASED_OUTPATIENT_CLINIC_OR_DEPARTMENT_OTHER): Payer: Self-pay | Admitting: Internal Medicine

## 2010-05-28 LAB — GLUCOSE, CAPILLARY: Glucose-Capillary: 87 mg/dL (ref 70–99)

## 2010-05-31 ENCOUNTER — Other Ambulatory Visit (HOSPITAL_BASED_OUTPATIENT_CLINIC_OR_DEPARTMENT_OTHER): Payer: Self-pay | Admitting: Internal Medicine

## 2010-05-31 LAB — GLUCOSE, CAPILLARY
Glucose-Capillary: 159 mg/dL — ABNORMAL HIGH (ref 70–99)
Glucose-Capillary: 129 mg/dL — ABNORMAL HIGH (ref 70–99)
Glucose-Capillary: 81 mg/dL (ref 70–99)

## 2010-06-01 ENCOUNTER — Other Ambulatory Visit (HOSPITAL_BASED_OUTPATIENT_CLINIC_OR_DEPARTMENT_OTHER): Payer: Self-pay | Admitting: Internal Medicine

## 2010-06-01 LAB — GLUCOSE, CAPILLARY: Glucose-Capillary: 133 mg/dL — ABNORMAL HIGH (ref 70–99)

## 2010-06-01 NOTE — Procedures (Signed)
DUPLEX DEEP VENOUS EXAM - LOWER EXTREMITY   INDICATION:  Right foot ulcer.   HISTORY:  Edema:  Left lower extremity.  Trauma/Surgery:  No.  Pain:  No.  PE:  No.  Previous DVT:  No.  Anticoagulants:  No.  Other:   DUPLEX EXAM:                CFV   SFV   PopV  PTV    GSV                R  L  R  L  R  L  R   L  R  L  Thrombosis    o  o  o  o  o  o  o   o  o  o  Spontaneous   +  +  +  +  +  +  +   +  +  +  Phasic        +  +  +  +  +  +  +   +  +  +  Augmentation  +  +  +  +  +  +  +   +  +  +  Compressible  +  +  +  +  +  +  +   +  +  +  Competent     +  o  o  o  o  o  o   o  o  o   Legend:  + - yes  o - no  p - partial  D - decreased   IMPRESSION:  1. Bilateral lower extremities appear negative for deep venous      thrombosis.  2. Bilateral lower extremities are positive for deep and superficial      insufficiency.    _____________________________  Quita Skye Hart [place], M.D.   EM/MEDQ  D:  12/02/2009  T:  12/02/2009  Job:  [id number]

## 2010-06-01 NOTE — Consult Note (Signed)
NEW PATIENT CONSULTATION   [name], [name]  DOB:  10-24-1955                                       12/28/2009  ZOXWR#:[id number]   Patient is a 55 year old female referred for swollen left lower  extremity.  She states that over the past 6 or 8 months she has been  having recent edema in the left leg in the thigh, calf, and foot area.  She has no history of deep vein thrombosis, which is documented, and  states she has had at least 2 ultrasounds prior to evaluation here,  which were negative.  She injured her second and third toe of the right  foot traumatically, and these were treated in the wound center with  eventual healing.  She does not ambulate long distances (less than 1  block) because of discomfort in her legs and fatigue.  She also twisted  her left ankle in May 2011, and this was about the time that the  swelling became noticeable.  She has worn some short-leg TED stockings  inconsistently and does elevate her legs on occasion.   CHRONIC MEDICAL PROBLEMS:  1. Diabetes mellitus.  2. Hypertension.  3. Negative for coronary artery disease, COPD, hyperlipidemia, or      stroke.   SOCIAL HISTORY:  She is single, has no children.  Does not use tobacco  or alcohol.  Quit smoking in 1976.   FAMILY HISTORY:  Positive for coronary artery disease and stroke in her  father and diabetes in her mother.   REVIEW OF SYSTEMS:  Negative for chest pain, dyspnea on exertion.  Does  have easy fatigueability and lower extremity discomfort, as noted.  No  chronic bronchitis, asthma, wheezing, musculoskeletal symptoms.  All  other systems are negative in complete review of systems.   PHYSICAL EXAMINATION:  Blood pressure 161/82, heart rate 87,  respirations 19.  Generally, she is a well-developed, well-nourished  female in no apparent distress, alert and oriented x3.  HEENT:  Normal  for age.  EOMs intact.  Lungs are clear to auscultation.  No rhonchi or  wheezing.   Cardiovascular:  Regular rate and rhythm.  No murmurs.  Carotid pulses are 3+.  No audible bruits audible.  Abdomen:  Soft,  nontender with no masses.  Musculoskeletal:  Free of major deformities.  Neurologic:  Normal.  Skin:  Free of rashes.  Lower extremity exam  reveals the left leg to be obviously edematous from the foot to the  inguinal area with the distal thigh being 6 cm larger than the right  side and the calf about 4 cm larger than the right side.  No active  ulcerations or varicosities are noted.  She has 3+ femoral, popliteal,  and dorsalis pedis pulses palpable bilaterally.  There is no  hyperpigmentation noted.   Today I ordered a venous duplex exam which I reviewed and interpreted.  The right leg is unremarkable.  The left leg reveals severe reflux in  the deep system throughout.  There is also reflux in the superficial  great saphenous system, most prominently at the saphenofemoral junction  and the proximal thigh with the vein becoming small distally.   I would suspect the patient has had a remote episode of DVT in the left  leg which caused valvular incompetence, and now she has severe reflux  causing her edema.  The best treatment plan would be:  1.  Elevation of  her legs at night by elevating the foot of the bed 2-3 inches.  2.  Place long-leg elastic compression stockings immediately in the morning  prior to arising.  3.  Diuresis as indicated.  4.  There is no  indication for laser ablation or treatment of her superficial system at  this time     Quita Skye. Hart [place], M.D.  Electronically Signed   JDL/MEDQ  D:  12/28/2009  T:  12/28/2009  Job:  [id number]   cc:   Dr. Tonny Branch at [facility]

## 2010-06-01 NOTE — Procedures (Signed)
LOWER EXTREMITY VENOUS REFLUX EXAM   INDICATION:  Edema.   EXAM:  Using color-flow imaging and pulse Doppler spectral analysis, the  right and left common femoral, superficial femoral, popliteal, posterior  tibial, greater and lesser saphenous veins are evaluated.  There is  evidence suggesting deep venous insufficiency in the right and left  lower extremities.   The right and left saphenofemoral junctions are competent. The left GSV  is not competent with Reflux of >569milliseconds with the caliber as  described below.   GSV Diameter (used if found to be incompetent only)                                            Right    Left  Proximal Greater Saphenous Vein           cm       0.56 cm  Proximal-to-mid-thigh                     cm       0.78 cm  Mid thigh                                 cm       0.35 cm  Mid-distal thigh                          cm       cm  Distal thigh                              cm       0.31 cm  Knee                                      cm       0.29 cm   IMPRESSION:  1. The left greater saphenous vein is not competent with reflux of      >519milliseconds.  2. The left greater saphenous vein is not tortuous.  3. The deep venous system is not competent with Reflux of >      noted throughout the left lower extremity deep      system and collaterals as well as left greater saphenous vein.   ___________________________________________  Quita Skye. Hart [place], M.D.   NT/MEDQ  D:  12/28/2009  T:  12/28/2009  Job:  [id number]

## 2010-06-02 ENCOUNTER — Other Ambulatory Visit (HOSPITAL_BASED_OUTPATIENT_CLINIC_OR_DEPARTMENT_OTHER): Payer: Self-pay | Admitting: Internal Medicine

## 2010-06-02 LAB — GLUCOSE, CAPILLARY: Glucose-Capillary: 165 mg/dL — ABNORMAL HIGH (ref 70–99)

## 2010-06-03 ENCOUNTER — Other Ambulatory Visit (HOSPITAL_BASED_OUTPATIENT_CLINIC_OR_DEPARTMENT_OTHER): Payer: Self-pay | Admitting: Internal Medicine

## 2010-06-03 LAB — GLUCOSE, CAPILLARY: Glucose-Capillary: 154 mg/dL — ABNORMAL HIGH (ref 70–99)

## 2010-06-04 ENCOUNTER — Other Ambulatory Visit (HOSPITAL_BASED_OUTPATIENT_CLINIC_OR_DEPARTMENT_OTHER): Payer: Self-pay | Admitting: Internal Medicine

## 2010-06-04 LAB — GLUCOSE, CAPILLARY: Glucose-Capillary: 124 mg/dL — ABNORMAL HIGH (ref 70–99)

## 2010-06-07 ENCOUNTER — Other Ambulatory Visit (HOSPITAL_BASED_OUTPATIENT_CLINIC_OR_DEPARTMENT_OTHER): Payer: Self-pay | Admitting: Internal Medicine

## 2010-06-08 ENCOUNTER — Other Ambulatory Visit (HOSPITAL_BASED_OUTPATIENT_CLINIC_OR_DEPARTMENT_OTHER): Payer: Self-pay | Admitting: Internal Medicine

## 2010-06-10 ENCOUNTER — Other Ambulatory Visit (HOSPITAL_BASED_OUTPATIENT_CLINIC_OR_DEPARTMENT_OTHER): Payer: Self-pay | Admitting: Internal Medicine

## 2010-06-10 LAB — GLUCOSE, CAPILLARY: Glucose-Capillary: 143 mg/dL — ABNORMAL HIGH (ref 70–99)

## 2010-06-18 ENCOUNTER — Other Ambulatory Visit: Payer: Self-pay

## 2010-06-18 ENCOUNTER — Telehealth: Payer: Self-pay | Admitting: *Deleted

## 2010-06-18 DIAGNOSIS — E119 Type 2 diabetes mellitus without complications: Secondary | ICD-10-CM

## 2010-06-18 DIAGNOSIS — D649 Anemia, unspecified: Secondary | ICD-10-CM

## 2010-06-18 DIAGNOSIS — R5383 Other fatigue: Secondary | ICD-10-CM

## 2010-06-18 NOTE — Telephone Encounter (Signed)
That's fine.  I did the referral for the ophthomology but I don't know if I have to do one specifically for the dental clinic.  I will forward to [facility] and if I need to let me know.

## 2010-06-18 NOTE — Telephone Encounter (Signed)
Pt calls and leaves message that she would like eye appt referral and dental referral... If this is possible please send to your nurse to process and complete   Thanks,h.

## 2010-06-19 LAB — CBC WITH DIFFERENTIAL/PLATELET
Basophils Absolute: 0 10*3/uL (ref 0.0–0.1)
Basophils Relative: 1 % (ref 0–1)
Eosinophils Absolute: 0.2 10*3/uL (ref 0.0–0.7)
HCT: 31.2 % — ABNORMAL LOW (ref 36.0–46.0)
MCH: 29.7 pg (ref 26.0–34.0)
MCHC: 32.7 g/dL (ref 30.0–36.0)
Monocytes Absolute: 0.4 10*3/uL (ref 0.1–1.0)
Neutro Abs: 2.9 10*3/uL (ref 1.7–7.7)
Neutrophils Relative %: 54 % (ref 43–77)
RDW: 14.5 % (ref 11.5–15.5)

## 2010-06-19 LAB — LIPID PANEL
HDL: 59 mg/dL (ref >39)
LDL Cholesterol: 86 mg/dL (ref 0–99)
Triglycerides: 62 mg/dL (ref <150)
VLDL: 12 mg/dL (ref 0–40)

## 2010-06-19 LAB — T4, FREE: Free T4: 1.27 ng/dL (ref 0.80–1.80)

## 2010-06-21 ENCOUNTER — Encounter (HOSPITAL_BASED_OUTPATIENT_CLINIC_OR_DEPARTMENT_OTHER): Payer: Self-pay | Attending: Internal Medicine

## 2010-06-21 DIAGNOSIS — L97509 Non-pressure chronic ulcer of other part of unspecified foot with unspecified severity: Secondary | ICD-10-CM | POA: Insufficient documentation

## 2010-06-21 DIAGNOSIS — Z79899 Other long term (current) drug therapy: Secondary | ICD-10-CM | POA: Insufficient documentation

## 2010-06-21 DIAGNOSIS — I739 Peripheral vascular disease, unspecified: Secondary | ICD-10-CM | POA: Insufficient documentation

## 2010-06-21 DIAGNOSIS — E1169 Type 2 diabetes mellitus with other specified complication: Secondary | ICD-10-CM | POA: Insufficient documentation

## 2010-06-21 DIAGNOSIS — I10 Essential (primary) hypertension: Secondary | ICD-10-CM | POA: Insufficient documentation

## 2010-06-22 NOTE — Assessment & Plan Note (Signed)
[facility]  [name], [name]              ACCOUNT NO.:  [id number]  MEDICAL RECORD NO.:  [id number]      DATE OF BIRTH:  January 03, 1956  PHYSICIAN:  Jonelle Sports. Cordarrius Coad, M.D.       VISIT DATE:                                  OFFICE VISIT   To whom it may concern.  This 55 year old white female has been followed in the [facility] since March of 2011 for an ulcer on the plantar aspect of her left heel.  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 At that point, hyperbaric oxygen therapy was added to her regimen and to this, we have provided usual ongoing wound care with debridement, etc., and also a course of treatment with Dermagraft.  At this date (June 21, 2010), the ulcer is not yet completely healed, and the patient is still under our management with more conservative measures now and with the use of a walking boot.  It is anticipated that this wound will heal, although how much additional time will be required is uncertain.  It also is unclear at this point to what degree of permanent disability she may experience.          ______________________________ Jonelle Sports. Cheryll Cockayne, M.D.     RES/MEDQ  D:  06/21/2010  T:  06/22/2010  Job:  [id number]

## 2010-07-19 ENCOUNTER — Encounter (HOSPITAL_BASED_OUTPATIENT_CLINIC_OR_DEPARTMENT_OTHER): Payer: Self-pay | Attending: Internal Medicine

## 2010-07-19 DIAGNOSIS — L97409 Non-pressure chronic ulcer of unspecified heel and midfoot with unspecified severity: Secondary | ICD-10-CM | POA: Insufficient documentation

## 2010-07-19 DIAGNOSIS — I739 Peripheral vascular disease, unspecified: Secondary | ICD-10-CM | POA: Insufficient documentation

## 2010-07-19 DIAGNOSIS — Z79899 Other long term (current) drug therapy: Secondary | ICD-10-CM | POA: Insufficient documentation

## 2010-07-19 DIAGNOSIS — I10 Essential (primary) hypertension: Secondary | ICD-10-CM | POA: Insufficient documentation

## 2010-07-19 DIAGNOSIS — E1169 Type 2 diabetes mellitus with other specified complication: Secondary | ICD-10-CM | POA: Insufficient documentation

## 2010-08-19 ENCOUNTER — Encounter (HOSPITAL_BASED_OUTPATIENT_CLINIC_OR_DEPARTMENT_OTHER): Payer: Self-pay | Attending: Internal Medicine

## 2010-08-19 DIAGNOSIS — E1169 Type 2 diabetes mellitus with other specified complication: Secondary | ICD-10-CM | POA: Insufficient documentation

## 2010-08-19 DIAGNOSIS — L97409 Non-pressure chronic ulcer of unspecified heel and midfoot with unspecified severity: Secondary | ICD-10-CM | POA: Insufficient documentation

## 2010-08-19 DIAGNOSIS — I10 Essential (primary) hypertension: Secondary | ICD-10-CM | POA: Insufficient documentation

## 2010-08-19 DIAGNOSIS — Z79899 Other long term (current) drug therapy: Secondary | ICD-10-CM | POA: Insufficient documentation

## 2010-08-19 DIAGNOSIS — I739 Peripheral vascular disease, unspecified: Secondary | ICD-10-CM | POA: Insufficient documentation

## 2010-08-25 ENCOUNTER — Encounter: Payer: Self-pay | Admitting: Internal Medicine

## 2010-08-25 ENCOUNTER — Ambulatory Visit (INDEPENDENT_AMBULATORY_CARE_PROVIDER_SITE_OTHER): Payer: Self-pay | Admitting: Internal Medicine

## 2010-08-25 VITALS — BP 167/90 | HR 82 | Temp 97.7°F | Ht 68.5 in | Wt 260.3 lb

## 2010-08-25 DIAGNOSIS — E119 Type 2 diabetes mellitus without complications: Secondary | ICD-10-CM

## 2010-08-25 DIAGNOSIS — L97909 Non-pressure chronic ulcer of unspecified part of unspecified lower leg with unspecified severity: Secondary | ICD-10-CM

## 2010-08-25 DIAGNOSIS — J40 Bronchitis, not specified as acute or chronic: Secondary | ICD-10-CM | POA: Insufficient documentation

## 2010-08-25 MED ORDER — AZITHROMYCIN 250 MG PO TABS: ORAL_TABLET | ORAL | Status: AC

## 2010-08-25 NOTE — Assessment & Plan Note (Signed)
She has been following up with the wound care center. Apparently she has been taking ciprofloxacin twice daily for a week and that has been prescribed by a physician there. States that her wound is healing well. Probably next Monday would be her last appointment. She was encouraged not to miss her appointment.

## 2010-08-25 NOTE — Patient Instructions (Signed)
Please schedule a follow up appointment in 1 month with your PCP( Dr. Tonny Branch). Please take your medicines as prescribed. Please call the clinic if your symptoms get worse.

## 2010-08-25 NOTE — Assessment & Plan Note (Addendum)
She did not get her glucose meter today. Her last A1C from 03/12 was also 6.9 . His A1c today is at goal. The bump in her AIC from 5.7 to 6.8 today could be explained from her left leg ulcer which is healing. Continue the current regimen for now. Was advised to get her glucometer  with her at next clinic appointment so that further adjustment in medications can be made if needed.  Lab Results  Component Value Date   HGBA1C 6.8 08/25/2010   HGBA1C 5.7 12/17/2009   CREATININE 1.33* 04/07/2010   CREATININE 0.94 01/22/2010   MICROALBUR 0.50 05/26/2010   MICRALBCREAT 9.6 05/26/2010   CHOL 157 06/18/2010   HDL 59 06/18/2010   TRIG 62 01/22/1094    Last eye exam and foot exam:    Component Value Date/Time   HMDIABEYEEXA normal 12/29/2009   HMDIABFOOTEX done 12/29/2009

## 2010-08-25 NOTE — Progress Notes (Signed)
  Subjective:    Patient ID: [name], female    DOB: Nov 27, 1955, 55 y.o.   MRN: [id number]  HPI: 55 year old woman with past medical history significant for hypertension, left foot ulcer , diabetes comes to the clinic for a followup visit.  She reports having productive cough where she is bringing yellowish- greenish phlegm for last 3-4 days. She has also noticed small amount of blood on 2 or 3 occasions. She also complains of some sore throat, chills and postnasal drip associated with it. She states that her cough is so bad that it is also interfering with her sleep and she has been sleeping in sitting posture for the last few nights. She also reports some chest discomfort when she coughs. Denies any fever, nausea or  Vomiting. For her left leg ulcer she follows up with the wound care center for hyperbaric treatment. Apparently she has been taking ciprofloxacin for that but it's healing very well and next Monday would likely be her last appointment.    Review of Systems  Constitutional: Positive for chills. Negative for fever, appetite change and fatigue.  HENT: Positive for congestion, sore throat and postnasal drip. Negative for nosebleeds, rhinorrhea and sneezing.   Eyes: Negative for photophobia and visual disturbance.  Respiratory: Positive for cough and shortness of breath. Negative for choking, chest tightness, wheezing and stridor.   Cardiovascular: Positive for chest pain.  Gastrointestinal: Negative for nausea, vomiting, diarrhea and abdominal distention.  Genitourinary: Negative for dysuria, frequency and flank pain.  Musculoskeletal: Negative for arthralgias.  Neurological: Negative for light-headedness and headaches.  Hematological: Negative for adenopathy.       Objective:   Physical Exam  Constitutional: She is oriented to person, place, and time. She appears well-developed and well-nourished. No distress.  HENT:  Head: Normocephalic and atraumatic.    Mouth/Throat: Oropharynx is clear and moist.  Eyes: Conjunctivae and EOM are normal. Pupils are equal, round, and reactive to light.  Neck: Normal range of motion. Neck supple. No JVD present. No tracheal deviation present. No thyromegaly present.  Cardiovascular: Normal rate, regular rhythm, normal heart sounds and intact distal pulses.  Exam reveals no gallop and no friction rub.   No murmur heard. Pulmonary/Chest: Effort normal and breath sounds normal. No stridor. No respiratory distress. She has no wheezes. She has no rales. She exhibits no tenderness.  Abdominal: Soft. Bowel sounds are normal. She exhibits no distension and no mass. There is no tenderness. There is no rebound and no guarding.  Musculoskeletal: Normal range of motion. She exhibits no edema and no tenderness.       Left leg dressing in place.  Lymphadenopathy:    She has no cervical adenopathy.  Neurological: She is alert and oriented to person, place, and time. She has normal reflexes. She displays normal reflexes. No cranial nerve deficit. She exhibits normal muscle tone. Coordination normal.  Skin: Skin is warm. She is not diaphoretic.          Assessment & Plan:

## 2010-08-25 NOTE — Assessment & Plan Note (Signed)
She complains of productive cough - bringing up greenish phlegm and has noted trace amounts of blood on 2 occasions. She also reports sore throat, chills and postnasal drip along with it. Differentials include bronchitis versus sinusitis. On exam she did not have significant sinus tenderness. I think this is most likely bronchitis, the trace amount of blood that she is having with her cough is likely from mucosal injury from excessive coughing. She has been using over-the-counter lozenges and was encouraged to continue those .Would treat her  with Z-Pak . Patient was advised to do warm saline gargles for her sore throat and steam inhalation that would help with congestion. She was advised to drink plenty of fluids.

## 2010-09-22 ENCOUNTER — Ambulatory Visit (INDEPENDENT_AMBULATORY_CARE_PROVIDER_SITE_OTHER): Payer: Self-pay | Admitting: Internal Medicine

## 2010-09-22 ENCOUNTER — Other Ambulatory Visit: Payer: Self-pay | Admitting: Internal Medicine

## 2010-09-22 ENCOUNTER — Encounter: Payer: Self-pay | Admitting: Internal Medicine

## 2010-09-22 DIAGNOSIS — I10 Essential (primary) hypertension: Secondary | ICD-10-CM

## 2010-09-22 DIAGNOSIS — Z1231 Encounter for screening mammogram for malignant neoplasm of breast: Secondary | ICD-10-CM

## 2010-09-22 DIAGNOSIS — E119 Type 2 diabetes mellitus without complications: Secondary | ICD-10-CM

## 2010-09-22 DIAGNOSIS — R609 Edema, unspecified: Secondary | ICD-10-CM

## 2010-09-22 DIAGNOSIS — L97909 Non-pressure chronic ulcer of unspecified part of unspecified lower leg with unspecified severity: Secondary | ICD-10-CM

## 2010-09-22 LAB — GLUCOSE, CAPILLARY

## 2010-09-22 MED ORDER — LISINOPRIL-HYDROCHLOROTHIAZIDE 10-12.5 MG PO TABS: 1.0000 | ORAL_TABLET | Freq: Every day | ORAL | Status: DC

## 2010-09-22 NOTE — Patient Instructions (Signed)
Stop the Lisinopril pill  Start Lisinopril/HCTZ 10/12.5 mg tablets 1 tablet daily for your blood pressure.  You can use an over the counter compression stocking for your leg that is swelling.  If the you get pain in the leg or the swelling is worse in the morning when you first wake up then you need to call us.  Follow up in a week to check your blood work with the restart of the fluid pill.

## 2010-09-22 NOTE — Progress Notes (Signed)
Subjective:   Patient ID: [name] female   DOB: [birth year] 55 y.o.   MRN: [id number]  HPI: [name] is a 55 y.o. woman who presents to clinic today complaining of left leg swelling over the last 2 weeks.  She states that her left leg has been swelling since they stopped casting her leg at wound care.  She states that the left leg is the only leg that has been swelling.  She states that when she wakes up after sleeping the leg is much better but as the day goes on the swelling gets worse.  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  She denies any pain in the leg, redness, bruising, or palpable cords in the leg.    She also states that she has been having some back pain on the right side of her low back.  She states that it is dull all the times with occasional sharp grabbing pains with certain movements.  She has tried 2 aleve and they seem to help.  It is better today then it has been in the last week.    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  The ulceration has healed over and she has been doing well.  She is using a heel pad with it and has been walking well with it.  She completed the hyperbaric treatment as well as the casting program.    She states that she had been taking her other medications for her diabetes and blood pressure as prescribed.   During her hyperbaric treatments we had to stop her HCTZ and decrease her lisinopril because of hypotensive episodes.  She hasn't been monitoring her blood pressures at home but denies any headaches, dizziness, blurry vision, SOB, or chest pain.  Past Medical History  Diagnosis Date  . Normocytic anemia   . Hypertension   . Diabetes mellitus   . Foot ulcer, left   . Leg edema    Current Outpatient Prescriptions  Medication Sig Dispense Refill  . aspirin 81 MG tablet Take 1 tablet (81 mg total) by mouth daily.  30 tablet  11  . Calcium Carbonate-Vitamin D 600-400  MG-UNIT per chew tablet Chew 1 tablet by mouth 2 (two) times daily.  30 tablet  11  . ferrous sulfate 325 (65 FE) MG tablet Take 325 mg by mouth daily. With orange juice       . lisinopril (PRINIVIL,ZESTRIL) 5 MG tablet Take 1 tablet (5 mg total) by mouth daily.  30 tablet  6  . metFORMIN (GLUCOPHAGE) 1000 MG tablet Take 1 tablet (1,000 mg total) by mouth daily with breakfast.  60 tablet  6  . Multiple Vitamin (MULTIVITAMIN) tablet Take 1 tablet by mouth daily.         Family History  Problem Relation Age of Onset  . Diabetes Mother   . Heart disease Father    History   Social History  . Marital Status: Single    Spouse Name: N/A    Number of Children: N/A  . Years of Education: N/A   Social History Main Topics  . Smoking status: Former Smoker    Types: Cigarettes  . Smokeless tobacco: None  . Alcohol Use: No  . Drug Use: No  . Sexually Active: None   Other Topics Concern  . 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  She was just approved for Disability and is looking for an income controlled housing so she can move out of the shelter.    Review of Systems: Constitutional: Denies fever, chills, diaphoresis, appetite change and fatigue.  HEENT: Denies photophobia, eye pain, redness, hearing loss, ear pain, congestion, sore throat, rhinorrhea, sneezing, mouth sores, trouble swallowing, neck pain, neck stiffness and tinnitus.   Respiratory: Denies SOB, DOE, cough, chest tightness,  and wheezing.   Cardiovascular: Denies chest pain, palpitations and leg swelling.  Gastrointestinal: Denies nausea, vomiting, abdominal pain, diarrhea, constipation, blood in stool and abdominal  distention.  Genitourinary: Denies dysuria, urgency, frequency, hematuria, flank pain and difficulty urinating.  Musculoskeletal: Denies myalgias, back pain, joint swelling, arthralgias and gait problem.  Skin: Denies pallor, rash and wound.  Neurological: Denies dizziness, seizures, syncope, weakness, light-headedness, numbness and headaches.  Hematological: Denies adenopathy. Easy bruising, personal or family bleeding history  Psychiatric/Behavioral: Denies suicidal ideation, mood changes, confusion, nervousness, sleep disturbance and agitation  Objective:  Physical Exam: Filed Vitals:   09/22/10 1640  BP: 185/93  Pulse: 72  Temp: 96.7 F (35.9 C)  TempSrc: Oral  Height: 5' 8.5" (1.74 m)  Weight: 254 lb 11.2 oz (115.531 kg)   Constitutional: Vital signs reviewed.  Patient is a well-developed and well-nourished woman in no acute distress and cooperative with exam. Alert and oriented x3.  Head: Normocephalic and atraumatic Ear: TM normal bilaterally Mouth: no erythema or exudates, MMM Eyes: PERRL, EOMI, conjunctivae normal, No scleral icterus.  Neck: Supple, Trachea midline normal ROM, No JVD, mass, thyromegaly, or carotid bruit present.  Cardiovascular: RRR, S1 normal, S2 normal, no MRG, pulses symmetric and intact bilaterally Pulmonary/Chest: CTAB, no wheezes, rales, or rhonchi Abdominal: Soft. Non-tender, non-distended, bowel sounds are normal, no masses, organomegaly, or guarding present.  GU: no CVA tenderness Musculoskeletal: The left leg has 2+ pitting edema to the knee.  There is 1+ edema to the knee in the right leg as well.  There is no palpable cord, erythema, or pain to palpation of the left calf.  No joint deformities, erythema, or stiffness, ROM full in her back, hips, knees, and ankle.  There is pain to palpation over the right lumbar paraspinous muscles.  Hematology: no cervical, inginal, or axillary adenopathy.  Neurological: A&O x3, Strength is normal and symmetric  bilaterally, cranial nerve II-XII are grossly intact, no focal motor deficit, sensory intact to light touch bilaterally.  Skin: Warm, dry and intact. No rash, cyanosis, or clubbing.  There is a healed ulceration on the base of her left heel.  No other open sores noted.  Psychiatric: Normal mood and affect. speech and behavior is normal. Judgment and thought content normal. Cognition and memory are normal.   Assessment & Plan:

## 2010-09-23 ENCOUNTER — Encounter: Payer: Self-pay | Admitting: Internal Medicine

## 2010-09-23 NOTE — Assessment & Plan Note (Signed)
BP Readings from Last 3 Encounters:  09/22/10 154/89  08/25/10 167/90  05/26/10 131/80   Her blood pressure is elevated today above her goal.  We had to decrease her medications when she was in the hyperbaric therapy but now will likely need to increase them back.  We will start by increasing her lisinopril to 10 mg and adding HCTZ 12.5 mg in the combo pill.  We will have her come back in a week to recheck her labs and likely increase her medication again until we are well controlled. As a diabetic her goal is <130/80.

## 2010-09-23 NOTE — Assessment & Plan Note (Signed)
Lab Results  Component Value Date   HGBA1C 6.8 08/25/2010   HGBA1C 6.9 04/07/2010   HGBA1C 5.7 12/17/2009   Lab Results  Component Value Date   MICROALBUR 0.50 05/26/2010   LDLCALC 86 06/18/2010   CREATININE 1.33* 04/07/2010   Her a1c is well controlled currently on her metformin.  We will continue to monitor and adjust her medication as needed.

## 2010-09-23 NOTE — Assessment & Plan Note (Signed)
Her heal ulcer is well healed at this point.  She will need orthopedic shoes and is working with the wound care center to get those.  She will continue to follow with them and will be discharged from the program soon.

## 2010-09-23 NOTE — Assessment & Plan Note (Signed)
Her left leg edema is likely due to venous insufficiency secondary to the DVT she had in her leg.  There are no signs of a current DVT at this time.  We are going to be restarted her HCTZ for her blood pressure and she was instructed to elevate her leg as often as she can and that she can use a compression stocking to help reduce the swelling.

## 2010-09-27 ENCOUNTER — Encounter (HOSPITAL_BASED_OUTPATIENT_CLINIC_OR_DEPARTMENT_OTHER): Payer: Self-pay | Attending: Internal Medicine

## 2010-09-27 DIAGNOSIS — E1169 Type 2 diabetes mellitus with other specified complication: Secondary | ICD-10-CM | POA: Insufficient documentation

## 2010-09-27 DIAGNOSIS — L97409 Non-pressure chronic ulcer of unspecified heel and midfoot with unspecified severity: Secondary | ICD-10-CM | POA: Insufficient documentation

## 2010-09-30 ENCOUNTER — Encounter: Payer: Self-pay | Admitting: Internal Medicine

## 2010-09-30 ENCOUNTER — Ambulatory Visit (INDEPENDENT_AMBULATORY_CARE_PROVIDER_SITE_OTHER): Payer: Self-pay | Admitting: Internal Medicine

## 2010-09-30 DIAGNOSIS — E119 Type 2 diabetes mellitus without complications: Secondary | ICD-10-CM

## 2010-09-30 DIAGNOSIS — R609 Edema, unspecified: Secondary | ICD-10-CM

## 2010-09-30 DIAGNOSIS — I10 Essential (primary) hypertension: Secondary | ICD-10-CM

## 2010-09-30 LAB — BASIC METABOLIC PANEL
BUN: 23 mg/dL (ref 6–23)
Creat: 1.08 mg/dL (ref 0.50–1.10)
Glucose, Bld: 142 mg/dL — ABNORMAL HIGH (ref 70–99)
Potassium: 4.8 mEq/L (ref 3.5–5.3)

## 2010-09-30 MED ORDER — FUROSEMIDE 20 MG PO TABS: 40.0000 mg | ORAL_TABLET | Freq: Every day | ORAL | Status: DC

## 2010-09-30 MED ORDER — POTASSIUM CHLORIDE 20 MEQ PO PACK: 20.0000 meq | PACK | Freq: Two times a day (BID) | ORAL | Status: DC

## 2010-09-30 MED ORDER — LISINOPRIL-HYDROCHLOROTHIAZIDE 10-12.5 MG PO TABS
2.0000 | ORAL_TABLET | Freq: Every day | ORAL | Status: DC
Start: 2010-09-30 — End: 2010-10-14

## 2010-09-30 NOTE — Assessment & Plan Note (Signed)
Lab Results  Component Value Date   HGBA1C 6.8 08/25/2010   HGBA1C 6.9 04/07/2010   HGBA1C 5.7 12/17/2009   Lab Results  Component Value Date   MICROALBUR 0.50 05/26/2010   LDLCALC 86 06/18/2010   CREATININE 1.33* 04/07/2010   A1C is well controlled and she up to date on all her other testing at this time.

## 2010-09-30 NOTE — Assessment & Plan Note (Signed)
She has had a 6 lb weight gain from her last appointment and the swelling appears to be worse.  We will increase her HCTZ and give her a short course of lasix.  We will also work on trying to get her a pair of compression stockings to help decrease the swelling bilaterally.

## 2010-09-30 NOTE — Progress Notes (Signed)
Subjective:   Patient ID: [name] female   DOB: 02/22/1955 55 y.o.   MRN: [id number]  HPI: [name] is a 55 y.o. woman who is here today for a recheck of her blood sugar as well as lab monitoring after starting HCTZ and increasing her lisinopril.  She has had not side effects from the medications but states that she didn't take the medication this morning.  She denies any dizziness on standing, syncope, headaches, chest pain, or blurry vision.    She has been having increased problems with swelling in both of her legs with the L>R.  She was evaluated by Dr. Hart Rochester in the past and diagnosed with severe venous reflux in her leg and he suspected that she had a remote DVT that was never diagnosed.  She has had several doppler ultrasounds that have been negative.  She has no pain in the leg and has been trying to elevate them during the day.  She states that at night they feel tight after a long day of being on her feet.  She has not been able to afford compression stockings recently.    Past Medical History  Diagnosis Date  . Normocytic anemia   . Hypertension   . Diabetes mellitus   . Foot ulcer, left   . Leg edema    Current Outpatient Prescriptions  Medication Sig Dispense Refill  . aspirin 81 MG tablet Take 1 tablet (81 mg total) by mouth daily.  30 tablet  11  . Calcium Carbonate-Vitamin D 600-400 MG-UNIT per chew tablet Chew 1 tablet by mouth 2 (two) times daily.  30 tablet  11  . ferrous sulfate 325 (65 FE) MG tablet Take 325 mg by mouth daily. With orange juice       . lisinopril-hydrochlorothiazide (PRINZIDE,ZESTORETIC) 10-12.5 MG per tablet Take 1 tablet by mouth daily.  30 tablet  6  . metFORMIN (GLUCOPHAGE) 1000 MG tablet Take 1 tablet (1,000 mg total) by mouth daily with breakfast.  60 tablet  6  . Multiple Vitamin (MULTIVITAMIN) tablet Take 1 tablet by mouth daily.         Family History  Problem Relation Age of Onset  . Diabetes Mother   . Heart disease  Father    History   Social History  . Marital Status: Single    Spouse Name: N/A    Number of Children: N/A  . Years of Education: N/A   Social History Main Topics  . Smoking status: Former Smoker    Types: Cigarettes  . Smokeless tobacco: None  . Alcohol Use: No  . Drug Use: No  . Sexually Active: None   Other Topics Concern  . 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  She was just approved for Disability and is looking for an income controlled housing so she can move out of the shelter.    Review of Systems: Constitutional: Denies fever, chills, diaphoresis, appetite change and fatigue.  HEENT: Denies photophobia, eye pain, redness, hearing loss, ear pain, congestion, sore throat, rhinorrhea, sneezing, mouth sores, trouble swallowing, neck pain, neck stiffness and tinnitus.   Respiratory: Denies SOB, DOE, cough, chest tightness,  and wheezing.   Cardiovascular: Denies chest pain,  palpitations and leg swelling.  Gastrointestinal: Denies nausea, vomiting, abdominal pain, diarrhea, constipation, blood in stool and abdominal distention.  Genitourinary: Denies dysuria, urgency, frequency, hematuria, flank pain and difficulty urinating.  Musculoskeletal: Denies myalgias, back pain, joint swelling, arthralgias and gait problem.  Skin: Denies pallor, rash and wound.  Neurological: Denies dizziness, seizures, syncope, weakness, light-headedness, numbness and headaches.  Hematological: Denies adenopathy. Easy bruising, personal or family bleeding history  Psychiatric/Behavioral: Denies suicidal ideation, mood changes, confusion, nervousness, sleep disturbance and agitation  Objective:    Physical Exam: Filed Vitals:   09/30/10 0827  BP: 185/95  Pulse: 71  Temp: 97.8 F (36.6 C)  TempSrc: Oral  Height: 5' 8.5" (1.74 m)  Weight: 262 lb 1.6 oz (118.888 kg)  SpO2: 99%   Constitutional: Vital signs reviewed.  Patient is a well-developed and well-nourished woman in no acute distress and cooperative with exam. Alert and oriented x3.  Head: Normocephalic and atraumatic Ear: TM normal bilaterally Mouth: no erythema or exudates, MMM Eyes: PERRL, EOMI, conjunctivae normal, No scleral icterus.  Neck: Supple, Trachea midline normal ROM, No JVD, mass, thyromegaly, or carotid bruit present.  Cardiovascular: RRR, S1 normal, S2 normal, no MRG, pulses symmetric and intact bilaterally Pulmonary/Chest: CTAB, no wheezes, rales, or rhonchi Abdominal: Soft. Non-tender, non-distended, bowel sounds are normal, no masses, organomegaly, or guarding present.  GU: no CVA tenderness Musculoskeletal: 2+ pitting edema on the right and 3+ pitting edema in the left.  No pain to palpation.  No joint deformities, erythema, or stiffness, ROM full and no nontender Hematology: no cervical, inginal, or axillary adenopathy.   Skin: Warm, dry and intact. No rash, cyanosis, or clubbing.  Psychiatric: Normal mood and affect. speech and behavior is normal. Judgment and thought content normal. Cognition and memory are normal.   Assessment & Plan:

## 2010-09-30 NOTE — Patient Instructions (Signed)
Increase the Lisinopril/HCTZ to 2 tablets daily for your blood pressure  Start Lasix (Furosemide) 20 mg tablets take 2 of them daily to help with the leg swelling.  Stop in the lab to have your blood drawn.  If we need to follow up on anything I will call you.   Follow up in two weeks to see how your blood pressure is doing.

## 2010-09-30 NOTE — Assessment & Plan Note (Signed)
BP Readings from Last 3 Encounters:  09/30/10 185/95  09/22/10 154/89  08/25/10 167/90   Her blood pressure today is elevated likely because she didn't take her medications.  We will check a bmet today to monitor the restart of HCTZ and the increase in the lisinopril.  Plan to increase her from Lisinopril/HCTZ 10/12.5 to 2 tablets.

## 2010-10-08 ENCOUNTER — Ambulatory Visit (HOSPITAL_COMMUNITY)
Admission: RE | Admit: 2010-10-08 | Discharge: 2010-10-08 | Disposition: A | Discharge status: Home / Self Care | Payer: Self-pay | Source: Ambulatory Visit | Attending: Family Medicine | Admitting: Family Medicine

## 2010-10-08 DIAGNOSIS — Z1231 Encounter for screening mammogram for malignant neoplasm of breast: Secondary | ICD-10-CM | POA: Insufficient documentation

## 2010-10-14 ENCOUNTER — Encounter: Payer: Self-pay | Admitting: Internal Medicine

## 2010-10-14 ENCOUNTER — Ambulatory Visit (INDEPENDENT_AMBULATORY_CARE_PROVIDER_SITE_OTHER): Payer: Self-pay | Admitting: Internal Medicine

## 2010-10-14 VITALS — BP 130/78 | HR 78 | Temp 97.4°F | Ht 68.5 in | Wt 255.1 lb

## 2010-10-14 DIAGNOSIS — E119 Type 2 diabetes mellitus without complications: Secondary | ICD-10-CM

## 2010-10-14 DIAGNOSIS — R609 Edema, unspecified: Secondary | ICD-10-CM

## 2010-10-14 DIAGNOSIS — I10 Essential (primary) hypertension: Secondary | ICD-10-CM

## 2010-10-14 LAB — BASIC METABOLIC PANEL
BUN: 26 mg/dL — ABNORMAL HIGH (ref 6–23)
CO2: 28 mEq/L (ref 19–32)
Calcium: 9.9 mg/dL (ref 8.4–10.5)
Creat: 1.17 mg/dL — ABNORMAL HIGH (ref 0.50–1.10)
Glucose, Bld: 144 mg/dL — ABNORMAL HIGH (ref 70–99)
Potassium: 5 mEq/L (ref 3.5–5.3)
Sodium: 139 mEq/L (ref 135–145)

## 2010-10-14 LAB — GLUCOSE, CAPILLARY: Glucose-Capillary: 173 mg/dL — ABNORMAL HIGH (ref 70–99)

## 2010-10-14 MED ORDER — LISINOPRIL 20 MG PO TABS: 20.0000 mg | ORAL_TABLET | Freq: Every day | ORAL | Status: DC

## 2010-10-14 MED ORDER — POTASSIUM CHLORIDE 20 MEQ PO PACK: 20.0000 meq | PACK | Freq: Two times a day (BID) | ORAL | Status: DC

## 2010-10-14 MED ORDER — FUROSEMIDE 40 MG PO TABS: 40.0000 mg | ORAL_TABLET | Freq: Every day | ORAL | Status: DC

## 2010-10-14 NOTE — Progress Notes (Signed)
Subjective:   Patient ID: ANJEL PERFETTI female   DOB: [birth year] 55 y.o.   MRN: [id number]  HPI: [name] is a 55 y.o. woman who presents to clinic for follow up from her last appointment.  At her last visit her lisinopril/HCTZ was increased and she was given a short course of Lasix to help with the swelling in her legs. She states that since her visit she has been doing great with a great decrease in the swelling and 7 lb weight loss.  She states that her legs have shrunk to the point where she can get her compression stockings on again.  She denies any problems with side effects from the medications.    Past Medical History  Diagnosis Date  . Normocytic anemia   . Hypertension   . Diabetes mellitus   . Foot ulcer, left   . Leg edema    Current Outpatient Prescriptions  Medication Sig Dispense Refill  . aspirin 81 MG tablet Take 1 tablet (81 mg total) by mouth daily.  30 tablet  11  . Calcium Carbonate-Vitamin D 600-400 MG-UNIT per chew tablet Chew 1 tablet by mouth 2 (two) times daily.  30 tablet  11  . ferrous sulfate 325 (65 FE) MG tablet Take 325 mg by mouth daily. With orange juice       . furosemide (LASIX) 20 MG tablet Take 2 tablets (40 mg total) by mouth daily.  30 tablet  0  . lisinopril-hydrochlorothiazide (PRINZIDE,ZESTORETIC) 10-12.5 MG per tablet Take 2 tablets by mouth daily.  60 tablet  6  . metFORMIN (GLUCOPHAGE) 1000 MG tablet Take 1 tablet (1,000 mg total) by mouth daily with breakfast.  60 tablet  6  . Multiple Vitamin (MULTIVITAMIN) tablet Take 1 tablet by mouth daily.        . potassium chloride (KLOR-CON) 20 MEQ packet Take 20 mEq by mouth 2 (two) times daily.  60 tablet  0   Family History  Problem Relation Age of Onset  . Diabetes Mother   . Heart disease Father    History   Social History  . Marital Status: Single    Spouse Name: N/A    Number of Children: N/A  . Years of Education: N/A   Social History Main Topics  . Smoking status:  Former Smoker    Types: Cigarettes  . Smokeless tobacco: None  . Alcohol Use: No  . Drug Use: No  . Sexually Active: None   Other Topics Concern  . 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  She was just approved for Disability and is looking for an income controlled housing so she can move out of the shelter.    Review of Systems: Negative except as noted in the HPI.   Objective:  Physical Exam: Filed Vitals:   10/14/10 0826  BP: 130/78  Pulse: 78  Temp: 97.4 F (36.3 C)  TempSrc: Oral  Height: 5' 8.5" (1.74 m)  Weight: 255 lb 1.6 oz (115.713 kg)   Constitutional: Vital signs reviewed.  Patient is a well-developed and well-nourished woman in no acute distress and cooperative with exam. Alert and oriented x3.  Head: Normocephalic and atraumatic Ear: TM normal bilaterally Mouth: no  erythema or exudates, MMM Eyes: PERRL, EOMI, conjunctivae normal, No scleral icterus.  Neck: Supple, Trachea midline normal ROM, No JVD, mass, thyromegaly, or carotid bruit present.  Cardiovascular: RRR, S1 normal, S2 normal, no MRG, pulses symmetric and intact bilaterally Pulmonary/Chest: CTAB, no wheezes, rales, or rhonchi Abdominal: Soft. Non-tender, non-distended, bowel sounds are normal, no masses, organomegaly, or guarding present.  GU: no CVA tenderness Musculoskeletal: +1 edema to the knee on the left, trace edema on the right.  No joint deformities, erythema, or stiffness, ROM full and no nontender Hematology: no cervical, inginal, or axillary adenopathy.  Neurological: A&O x3, Strength is normal and symmetric bilaterally, cranial nerve II-XII are grossly intact, no focal motor deficit,  sensory intact to light touch bilaterally.  Skin: Warm, dry and intact. No rash, cyanosis, or clubbing.  Psychiatric: Normal mood and affect. speech and behavior is normal. Judgment and thought content normal. Cognition and memory are normal.   Assessment & Plan:

## 2010-10-14 NOTE — Patient Instructions (Addendum)
Stop the Lisinopril/HCTZ combination pill  Start Lisinopril 20 mg daily  Continue the Lasix 40 mg daily  Stop in the lab to have your blood drawn.  If there is anything we need to follow up on I will call you.   Follow up in the middle to end of November to recheck your diabetes control

## 2010-10-15 ENCOUNTER — Telehealth: Payer: Self-pay | Admitting: *Deleted

## 2010-10-15 DIAGNOSIS — I10 Essential (primary) hypertension: Secondary | ICD-10-CM

## 2010-10-15 MED ORDER — POTASSIUM CHLORIDE CRYS ER 20 MEQ PO TBCR: 20.0000 meq | EXTENDED_RELEASE_TABLET | Freq: Every day | ORAL | Status: DC

## 2010-10-15 NOTE — Assessment & Plan Note (Signed)
Lab Results  Component Value Date   NA 139 10/14/2010   K 5.0 10/14/2010   CL 102 10/14/2010   CO2 28 10/14/2010   BUN 26* 10/14/2010   CREATININE 1.17* 10/14/2010   CREATININE 0.94 01/22/2010    BP Readings from Last 3 Encounters:  10/14/10 130/78  09/30/10 185/95  09/22/10 154/89    Assessment: Hypertension control:  controlled  Progress toward goals:  at goal Barriers to meeting goals:  no barriers identified  Plan: Hypertension treatment:  continue current medications Her blood pressure and swelling is much better controlled on the Lasix then on the HCTZ alone.  We will plan on stopping the HCTZ and continuing Lisinopril 20 mg daily and Lasix 40 mg daily with a 20 mEq KCl supplement

## 2010-10-15 NOTE — Telephone Encounter (Signed)
I changed it for her.  Also if you could Venita Sheffield when you call her to tell her about the change please tell her I reviewed her labs and she only needs to take 1 tablet daily as opposed to 1 tablet twice daily.  Thanks!

## 2010-10-15 NOTE — Telephone Encounter (Signed)
Pt would like to use tablets versus the packets.  Has used the tablets in the past.

## 2010-10-15 NOTE — Assessment & Plan Note (Signed)
Lab Results  Component Value Date   HGBA1C 6.8 08/25/2010   HGBA1C 6.9 04/07/2010   HGBA1C 5.7 12/17/2009   Lab Results  Component Value Date   MICROALBUR 0.50 05/26/2010   LDLCALC 86 06/18/2010   CREATININE 1.17* 10/14/2010   She is due for a A1C in November and is up to date on all of her other health maintenance items.

## 2010-10-15 NOTE — Assessment & Plan Note (Signed)
Her swelling is much improved with Lasix.  We will continue the lasix for now and have her follow up.

## 2010-10-15 NOTE — Telephone Encounter (Signed)
Pt was called and informed that pills have been ordered and that she will need to take 1 daily instead of the 2 that she had previously been taking.  Pt voiced an understanding of the plan.

## 2010-10-25 ENCOUNTER — Encounter (HOSPITAL_BASED_OUTPATIENT_CLINIC_OR_DEPARTMENT_OTHER): Payer: Self-pay

## 2010-12-03 ENCOUNTER — Encounter: Payer: Self-pay | Admitting: Internal Medicine

## 2010-12-06 NOTE — H&P (Signed)
  [name], [name]              ACCOUNT NO.:  [id number]  MEDICAL RECORD NO.:  [id number]          PATIENT TYPE:  OUT  LOCATION:  XRAY                         FACILITY[facility]  [facility]  PHYSICIAN:  Ardath Sax, M.D.     DATE OF BIRTH:  05-25-1955  DATE OF ADMISSION:  11/04/2009 DATE OF DISCHARGE:                             HISTORY & PHYSICAL   This is a 55 year old obese female who has a history for about a year of type 2 diabetes.  She weighs 230 pounds.  She takes metformin for her diabetes.  She also has hypertension and takes 20 mg of lisinopril.  She states that about a week ago she bumped her right foot on a table, and she comes in here with an ulcer on the dorsal aspect of her third toe distally.  There was skin that was debrided off it was devascularized. She also had some ingrown toenails, and I cut down especially the right great toe, which had a large amount of fungus underneath the nail, and I debrided that off.  She needs to have this x-rayed just to make sure they are is an osteo, so we ordered an x-ray of her foot to see if the third toe has any evidence of any infection, and we cultured it, and we treated her with a Puracol dressing on the right third toe, and she will come back in a week and by that time, we will have the results of the x- ray.  It is possible that she is an HBO a candidate, but I think this ulcer is superficial enough that I think with proper care, these Puracol dressings will probably take care of it without any problems.     Ardath Sax, M.D.     PP/MEDQ  D:  11/04/2009  T:  11/05/2009  Job:  [id number]  Electronically Signed by Ardath Sax  on 12/06/2010 01:21:28 PM

## 2010-12-24 ENCOUNTER — Encounter: Payer: Self-pay | Admitting: Internal Medicine

## 2010-12-24 ENCOUNTER — Ambulatory Visit (INDEPENDENT_AMBULATORY_CARE_PROVIDER_SITE_OTHER): Payer: Medicaid Other | Admitting: Internal Medicine

## 2010-12-24 VITALS — BP 122/76 | HR 73 | Temp 97.5°F | Ht 69.0 in | Wt 264.4 lb

## 2010-12-24 DIAGNOSIS — E119 Type 2 diabetes mellitus without complications: Secondary | ICD-10-CM

## 2010-12-24 DIAGNOSIS — Z23 Encounter for immunization: Secondary | ICD-10-CM

## 2010-12-24 DIAGNOSIS — D649 Anemia, unspecified: Secondary | ICD-10-CM

## 2010-12-24 DIAGNOSIS — R609 Edema, unspecified: Secondary | ICD-10-CM

## 2010-12-24 DIAGNOSIS — L97909 Non-pressure chronic ulcer of unspecified part of unspecified lower leg with unspecified severity: Secondary | ICD-10-CM

## 2010-12-24 DIAGNOSIS — I10 Essential (primary) hypertension: Secondary | ICD-10-CM

## 2010-12-24 LAB — BASIC METABOLIC PANEL
CO2: 29 mEq/L (ref 19–32)
Chloride: 102 mEq/L (ref 96–112)
Creat: 1.12 mg/dL — ABNORMAL HIGH (ref 0.50–1.10)
Potassium: 4.8 mEq/L (ref 3.5–5.3)

## 2010-12-24 LAB — GLUCOSE, CAPILLARY: Glucose-Capillary: 249 mg/dL — ABNORMAL HIGH (ref 70–99)

## 2010-12-24 LAB — CBC
Hemoglobin: 10.3 g/dL — ABNORMAL LOW (ref 12.0–15.0)
MCH: 28.1 pg (ref 26.0–34.0)
MCHC: 32.5 g/dL (ref 30.0–36.0)
MCV: 86.4 fL (ref 78.0–100.0)
RBC: 3.67 MIL/uL — ABNORMAL LOW (ref 3.87–5.11)
WBC: 4.8 10*3/uL (ref 4.0–10.5)

## 2010-12-24 LAB — POCT GLYCOSYLATED HEMOGLOBIN (HGB A1C): Hemoglobin A1C: 8.1

## 2010-12-24 NOTE — Patient Instructions (Addendum)
Diabetes Meal Planning Guide The diabetes meal planning guide is a tool to help you plan your meals and snacks. It is important for people with diabetes to manage their blood glucose (sugar) levels. Choosing the right foods and the right amounts throughout your day will help control your blood glucose. Eating right can even help you improve your blood pressure and reach or maintain a healthy weight. CARBOHYDRATE COUNTING MADE EASY When you eat carbohydrates, they turn to sugar. This raises your blood glucose level. Counting carbohydrates can help you control this level so you feel better. When you plan your meals by counting carbohydrates, you can have more flexibility in what you eat and balance your medicine with your food intake. Carbohydrate counting simply means adding up the total amount of carbohydrate grams in your meals and snacks. Try to eat about the same amount at each meal. Foods with carbohydrates are listed below. Each portion below is 1 carbohydrate serving or 15 grams of carbohydrates. Ask your dietician how many grams of carbohydrates you should eat at each meal or snack. Grains and Starches  1 slice bread.    English muffin or hotdog/hamburger bun.    cup cold cereal (unsweetened).   ? cup cooked pasta or rice.    cup starchy vegetables (corn, potatoes, peas, beans, winter squash).   1 tortilla (6 inches).    bagel.   1 waffle or pancake (size of a CD).    cup cooked cereal.   4 to 6 small crackers.  *Whole grain is recommended. Fruit  1 cup fresh unsweetened berries, melon, papaya, pineapple.   1 small fresh fruit.    banana or mango.    cup fruit juice (4 oz unsweetened).    cup canned fruit in natural juice or water.   2 tbs dried fruit.   12 to 15 grapes or cherries.  Milk and Yogurt  1 cup fat-free or 1% milk.   1 cup soy milk.   6 oz light yogurt with sugar-free sweetener.   6 oz low-fat soy yogurt.   6 oz plain yogurt.   Vegetables  1 cup raw or  cup cooked is counted as 0 carbohydrates or a "free" food.   If you eat 3 or more servings at 1 meal, count them as 1 carbohydrate serving.  Other Carbohydrates   oz chips or pretzels.    cup ice cream or frozen yogurt.    cup sherbet or sorbet.   2 inch square cake, no frosting.   1 tbs honey, sugar, jam, jelly, or syrup.   2 small cookies.   3 squares of graham crackers.   3 cups popcorn.   6 crackers.   1 cup broth-based soup.   Count 1 cup casserole or other mixed foods as 2 carbohydrate servings.   Foods with less than 20 calories in a serving may be counted as 0 carbohydrates or a "free" food.  You may want to purchase a book or computer software that lists the carbohydrate gram counts of different foods. In addition, the nutrition facts panel on the labels of the foods you eat are a good source of this information. The label will tell you how big the serving size is and the total number of carbohydrate grams you will be eating per serving. Divide this number by 15 to obtain the number of carbohydrate servings in a portion. Remember, 1 carbohydrate serving equals 15 grams of carbohydrate. SERVING SIZES Measuring foods and serving sizes helps you   make sure you are getting the right amount of food. The list below tells how big or small some common serving sizes are.  1 oz.........4 stacked dice.   3 oz........Marland KitchenDeck of cards.   1 tsp.......Marland KitchenTip of little finger.   1 tbs......Marland KitchenMarland KitchenThumb.   2 tbs.......Marland KitchenGolf ball.    cup......Marland KitchenHalf of a fist.   1 cup.......Marland KitchenA fist.  SAMPLE DIABETES MEAL PLAN Below is a sample meal plan that includes foods from the grain and starches, dairy, vegetable, fruit, and meat groups. A dietician can individualize a meal plan to fit your calorie needs and tell you the number of servings needed from each food group. However, controlling the total amount of carbohydrates in your meal or snack is more important than  making sure you include all of the food groups at every meal. You may interchange carbohydrate containing foods (dairy, starches, and fruits). The meal plan below is an example of a 2000 calorie diet using carbohydrate counting. This meal plan has 17 carbohydrate servings. Breakfast  1 cup oatmeal (2 carb servings).    cup light yogurt (1 carb serving).   1 cup blueberries (1 carb serving).    cup almonds.  Snack  1 large apple (2 carb servings).   1 low-fat string cheese stick.  Lunch  Chicken breast salad.   1 cup spinach.    cup chopped tomatoes.   2 oz chicken breast, sliced.   2 tbs low-fat [place] dressing.   12 whole-wheat crackers (2 carb servings).   12 to 15 grapes (1 carb serving).   1 cup low-fat milk (1 carb serving).  Snack  1 cup carrots.    cup hummus (1 carb serving).  Dinner  3 oz broiled salmon.   1 cup brown rice (3 carb servings).  Snack  1  cups steamed broccoli (1 carb serving) drizzled with 1 tsp olive oil and lemon juice.   1 cup light pudding (2 carb servings).  DIABETES MEAL PLANNING WORKSHEET Your dietician can use this worksheet to help you decide how many servings of foods and what types of foods are right for you.  BREAKFAST Food Group and Servings / Carb Servings Grain/Starches __________________________________ Dairy __________________________________________ Vegetable ______________________________________ Fruit ___________________________________________ Meat __________________________________________ Fat ____________________________________________ LUNCH Food Group and Servings / Carb Servings Grain/Starches ___________________________________ Dairy ___________________________________________ Fruit ____________________________________________ Meat ___________________________________________ Fat _____________________________________________ Laural Golden Food Group and Servings / Carb Servings Grain/Starches  ___________________________________ Dairy ___________________________________________ Fruit ____________________________________________ Meat ___________________________________________ Fat _____________________________________________ SNACKS Food Group and Servings / Carb Servings Grain/Starches ___________________________________ Dairy ___________________________________________ Vegetable _______________________________________ Fruit ____________________________________________ Meat ___________________________________________ Fat _____________________________________________ DAILY TOTALS Starches _________________________ Vegetable ________________________ Fruit ____________________________ Dairy ____________________________ Meat ____________________________ Fat ______________________________ Document Released: 09/30/2004 Document Revised: 09/15/2010 Document Reviewed: 08/11/2008 Exit[employer] Patient Information 2012 [employer].  1. Continue your medication as prescribed.  2.  Work on increasing your activity.  30 minutes at least 3 days per week can help you lose weight and get better control of your blood sugars.  3.  Stop in the lab.  If there is anything we need to follow up on I will call you.  4.  Follow up in 3 months.

## 2010-12-24 NOTE — Progress Notes (Signed)
Subjective:   Patient ID: [name] female   DOB: 05-26-1955 55 y.o.   MRN: [id number]  HPI: [name] is a 55 y.o. woman who presents to clinic today for routine follow up of her chronic medical problems including hypertension, diabetes, anemia, leg edema, and recurrent foot ulcers.  She states that she has moved into her new apartment now that she has her disability and that she is working to get furniture and make it into a home.    She continues to follow with Dr. Severe for the recurrent foot ulcerations.  Currently she has no open wounds and was recently given a brace to try to slowly turn her foot so that these ulcers to not recur again.    She states that she had some coughing and some chest pain with the coughing for the last week.  She has been using over the counter diabetic cough medication and has been getting much better.  She denies fevers, chills, nausea, vomiting, or shortness of breath.   She states that she has been taking her medication as directed.  Her weight is up slightly today which she states is due to her not watching her diet as closely over thanksgiving as well as not walking as much since the last ulcer healed.    She states that she has been taking her iron and was wondering if her hemoglobin was better yet because the iron makes her mildly constipated.    Past Medical History  Diagnosis Date  . Normocytic anemia   . Hypertension   . Diabetes mellitus   . Foot ulcer, left   . Leg edema    Current Outpatient Prescriptions  Medication Sig Dispense Refill  . aspirin 81 MG tablet Take 1 tablet (81 mg total) by mouth daily.  30 tablet  11  . Calcium Carbonate-Vitamin D 600-400 MG-UNIT per chew tablet Chew 1 tablet by mouth 2 (two) times daily.  30 tablet  11  . ferrous sulfate 325 (65 FE) MG tablet Take 325 mg by mouth daily. With orange juice       . furosemide (LASIX) 40 MG tablet Take 1 tablet (40 mg total) by mouth daily.  30 tablet  6  .  lisinopril (PRINIVIL,ZESTRIL) 20 MG tablet Take 1 tablet (20 mg total) by mouth daily.  30 tablet  6  . metFORMIN (GLUCOPHAGE) 1000 MG tablet Take 1 tablet (1,000 mg total) by mouth daily with breakfast.  60 tablet  6  . Multiple Vitamin (MULTIVITAMIN) tablet Take 1 tablet by mouth daily.        . potassium chloride SA (K-DUR,KLOR-CON) 20 MEQ tablet Take 1 tablet (20 mEq total) by mouth daily.  30 tablet  6   Family History  Problem Relation Age of Onset  . Diabetes Mother   . Heart disease Father    History   Social History  . Marital Status: Single    Spouse Name: N/A    Number of Children: N/A  . Years of Education: N/A   Social History Main Topics  . Smoking status: Former Smoker    Types: Cigarettes  . Smokeless tobacco: None  . Alcohol Use: No  . Drug Use: No  . Sexually Active: None   Other Topics Concern  . 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  She was just approved for Disability and is looking for an income controlled housing so she can move out of the shelter.    Review of Systems: Constitutional: Denies fever, chills, diaphoresis, appetite change and fatigue.  HEENT: Positive for congestion.  Denies photophobia, eye pain, redness, hearing loss, ear pain, sore throat, rhinorrhea, sneezing, mouth sores, trouble swallowing, neck pain, neck stiffness and tinnitus.   Respiratory: Positive for cough. Denies SOB, DOE, chest tightness,  and wheezing.   Cardiovascular: Denies chest pain, palpitations and leg swelling.  Gastrointestinal: Denies nausea, vomiting, abdominal pain, diarrhea, constipation, blood in stool and abdominal distention.  Genitourinary: Denies  dysuria, urgency, frequency, hematuria, flank pain and difficulty urinating.  Musculoskeletal: Denies myalgias, back pain, joint swelling, arthralgias and gait problem.  Skin: Denies pallor, rash and wound.  Neurological: Denies dizziness, seizures, syncope, weakness, light-headedness, numbness and headaches.  Hematological: Denies adenopathy. Easy bruising, personal or family bleeding history  Psychiatric/Behavioral: Denies suicidal ideation, mood changes, confusion, nervousness, sleep disturbance and agitation  Objective:  Physical Exam: Filed Vitals:   12/24/10 0931  BP: 122/76  Pulse: 73  Temp: 97.5 F (36.4 C)  TempSrc: Oral  Height: 5\' 9"  (1.753 m)  Weight: 264 lb 6.4 oz (119.931 kg)  SpO2: 100%   Constitutional: Vital signs reviewed.  Patient is a well-developed and well-nourished woman in no acute distress and cooperative with exam. Alert and oriented x3.  Head: Normocephalic and atraumatic Ear: TM normal bilaterally Mouth: no erythema or exudates, MMM Eyes: PERRL, EOMI, conjunctivae normal, No scleral icterus.  Neck: Supple, Trachea midline normal ROM, No JVD, mass, thyromegaly, or carotid bruit present.  Cardiovascular: RRR, S1 normal, S2 normal, no MRG, pulses symmetric and intact bilaterally Pulmonary/Chest: CTAB, no wheezes, rales, or rhonchi Abdominal: Soft. Non-tender, non-distended, bowel sounds are normal, no masses, organomegaly, or guarding present.  Musculoskeletal: No joint deformities, erythema, or stiffness, ROM full and no nontender Hematology: no cervical, inginal, or axillary adenopathy.  Neurological: A&O x3, Strength is normal and symmetric bilaterally, cranial nerve II-XII are grossly intact, no focal motor deficit, sensory intact to light touch bilaterally.  Skin: Trace edema noted in the bilateral lower extremities.  There is a compression stocking on the left leg.  Warm, dry and intact. No rash, cyanosis, or clubbing.  Psychiatric: Normal mood and  affect. speech and behavior is normal. Judgment and thought content normal. Cognition and memory are normal.   Assessment & Plan:

## 2010-12-27 ENCOUNTER — Encounter (HOSPITAL_BASED_OUTPATIENT_CLINIC_OR_DEPARTMENT_OTHER): Payer: Medicaid Other | Attending: Internal Medicine

## 2010-12-27 DIAGNOSIS — Z79899 Other long term (current) drug therapy: Secondary | ICD-10-CM | POA: Insufficient documentation

## 2010-12-27 DIAGNOSIS — E1169 Type 2 diabetes mellitus with other specified complication: Secondary | ICD-10-CM | POA: Insufficient documentation

## 2010-12-27 DIAGNOSIS — L97409 Non-pressure chronic ulcer of unspecified heel and midfoot with unspecified severity: Secondary | ICD-10-CM | POA: Insufficient documentation

## 2010-12-29 NOTE — Assessment & Plan Note (Signed)
>>  ASSESSMENT AND PLAN FOR NORMOCYTIC ANEMIA WRITTEN ON 12/29/2010  8:59 AM BY PRIBULA, CHRISTOPHER, MD  CBC:    Component Value Date/Time   WBC 4.8 12/24/2010 1015   HGB 10.3* 12/24/2010 1015   HCT 31.7* 12/24/2010 1015   PLT 151 12/24/2010 1015   MCV 86.4 12/24/2010 1015   NEUTROABS 2.9 06/18/2010 0934   LYMPHSABS 1.9 06/18/2010 0934   MONOABS 0.4 06/18/2010 0934   EOSABS 0.2 06/18/2010 0934   BASOSABS 0.0 06/18/2010 0934   Her anemia is at its baseline.  She has had some mild CKD and has had some chronic inflammation with the continual wounds on her feet that we will monitor for now that she has healed them completely to see if it continues to rise.

## 2010-12-29 NOTE — Assessment & Plan Note (Signed)
Lab Results  Component Value Date   HGBA1C 8.1 12/24/2010   HGBA1C 6.8 08/25/2010   HGBA1C 6.9 04/07/2010   Lab Results  Component Value Date   MICROALBUR 0.50 05/26/2010   LDLCALC 86 06/18/2010   CREATININE 1.12* 12/24/2010   Her A1C is mildly increased from previous.  She has gained weight and has not been following her diet as much as well.  She has some room to go up on her metformin but we will give her a shot with diet and weight loss.  If at her follow up appointment her A1C is still high we will likely increase her metformin.

## 2010-12-29 NOTE — Assessment & Plan Note (Signed)
Lab Results  Component Value Date   NA 138 12/24/2010   K 4.8 12/24/2010   CL 102 12/24/2010   CO2 29 12/24/2010   BUN 23 12/24/2010   CREATININE 1.12* 12/24/2010   CREATININE 0.94 01/22/2010    BP Readings from Last 3 Encounters:  12/24/10 122/76  10/14/10 130/78  09/30/10 185/95    Assessment: Hypertension control:  controlled  Progress toward goals:  at goal Barriers to meeting goals:  no barriers identified  Plan: Hypertension treatment:  continue current medications

## 2010-12-29 NOTE — Assessment & Plan Note (Signed)
CBC:    Component Value Date/Time   WBC 4.8 12/24/2010 1015   HGB 10.3* 12/24/2010 1015   HCT 31.7* 12/24/2010 1015   PLT 151 12/24/2010 1015   MCV 86.4 12/24/2010 1015   NEUTROABS 2.9 06/18/2010 0934   LYMPHSABS 1.9 06/18/2010 0934   MONOABS 0.4 06/18/2010 0934   EOSABS 0.2 06/18/2010 0934   BASOSABS 0.0 06/18/2010 0934   Her anemia is at its baseline.  She has had some mild CKD and has had some chronic inflammation with the continual wounds on her feet that we will monitor for now that she has healed them completely to see if it continues to rise.

## 2010-12-29 NOTE — Assessment & Plan Note (Signed)
She is doing well on her current dose of lasix and use of her compression stockings.  We will continue to monitor.  Basic Metabolic Panel:    Component Value Date/Time   NA 138 12/24/2010 1015   K 4.8 12/24/2010 1015   CL 102 12/24/2010 1015   CO2 29 12/24/2010 1015   BUN 23 12/24/2010 1015   CREATININE 1.12* 12/24/2010 1015   CREATININE 0.94 01/22/2010 2129   GLUCOSE 220* 12/24/2010 1015   CALCIUM 9.1 12/24/2010 1015   Her creatinine is near her baseline.  We will need to continue to monitor.

## 2010-12-29 NOTE — Assessment & Plan Note (Signed)
All of her ulcers are currently healed.  We will continue to follow along with Dr. Severe as he works to help her stop the recurrences.

## 2010-12-30 ENCOUNTER — Encounter: Payer: Self-pay | Admitting: Dietician

## 2010-12-30 ENCOUNTER — Other Ambulatory Visit: Payer: Self-pay | Admitting: Dietician

## 2010-12-30 ENCOUNTER — Ambulatory Visit (INDEPENDENT_AMBULATORY_CARE_PROVIDER_SITE_OTHER): Payer: Medicaid Other | Admitting: Dietician

## 2010-12-30 DIAGNOSIS — E119 Type 2 diabetes mellitus without complications: Secondary | ICD-10-CM

## 2010-12-30 MED ORDER — GLUCOSE BLOOD VI STRP: ORAL_STRIP | Status: DC

## 2010-12-30 MED ORDER — ACCU-CHEK FASTCLIX LANCETS MISC: 1.0000 | Freq: Two times a day (BID) | Status: DC

## 2010-12-30 NOTE — Patient Instructions (Signed)
Try to keep carbs consistent each day  45- 60 grams carb for meals and about 15-20 grams for snacks  Please check your blood sugar before and after one meal in the next month and record food intake and try to total the grams of carbohydrate at that meal.  Can use the the food label or the books I gave you to find out how many grams of carb a food has.  We will send in a prescription for test strips and lancets to your [facility] today.  Pleas call with questions.

## 2010-12-30 NOTE — Progress Notes (Signed)
Diabetes Self-Management Training (DSMT)  Initial Visit  12/30/2010 [name], identified by name and date of birth, is a 55 y.o. female with Type 2 Diabetes. Year of diabetes diagnosis: 2011 Other persons present: no  ASSESSMENT Patient concerns are Nutrition/meal planning, Monitoring, Healthy Lifestyle, Problem solving, Glycemic control and Weight control.  Height 5\' 9"  (1.753 m), weight 261 lb 1.6 oz (118.434 kg). Body mass index is 38.56 kg/(m^2). Lab Results  Component Value Date   LDLCALC 86 06/18/2010   Lab Results  Component Value Date   HGBA1C 8.1 12/24/2010   Medication Nutrition Monitor: gave patient an accu-chek nano meter today- she was able to successfully repeat meter use by checking her own blood sugar in office. the result was 223 mg/dl after a meal of ~ 92 grams carb  Labs reviewed.  DIABETES BUNDLE: A1C in past 6 months? Yes.  Less than 7%? No LDL in past year? Yes.  Less than 100 mg/dL? Yes Microalbumin ratio in past year? No. Patient taking ACE or ARB? Yes. Blood pressure less than 130/80? Yes. Foot exam in last year? Yes. Eye exam in past year? Yes. Tobacco use? No. Pneumovax? Yes Flu vaccine? Yes Asprin? Yes  Family history of diabetes: Yes Support systems: needs to assess at next visit  Special needs: None  Prior DM Education: Yes   Medications See Medications list.  Has adequate knowledge  Patients belief/attitude about diabetes: Diabetes can be controlled.  Self foot exams daily: Yes  Diabetes Complications: Neuropathy Retinopathy   Exercise Plan Doing ADLs, walking and swimming for 15 minutesa day.   Self-Monitoring Frequency of testing: stopped testing Breakfast: ?   Hyperglycemia: Yes Rarely Hypoglycemia: No   Meal Planning Some knowledge and Interested in improving   Assessment comments: very eager to learn. Learns quickly.     INDIVIDUAL DIABETES EDUCATION PLAN:  Diabetes disease state Nutrition  management Medication Monitoring Goal setting _______________________________________________________________________  Intervention TOPICS COVERED TODAY:  Diabetes disease state Definition of diabetes, type 1 and 2, and the diagnosis of diabetes. Explored patient's options for treatment of their diabetes. Nutrition management  Role of diet in the treatment of diabetes and the relationship between the three main macronutritents and blood glucose control. Food label reading, portion sizes and measuring food. Reviewed blood glucose goals for pre and post meals and how to evaluate the patients' food intake on their blood glucose level. Medication  Reviewed patients medication for diabetes, action, purpose, timing of dose and side effects. Monitoring  Taught/evaluated SMBG with accu check nano meter. Goal setting  Helped patient develop diabetes management plan for improving her blood sugar through meal planning  PATIENTS GOALS/PLAN (copy and paste in patient instructions so patient receives a copy): 1.  Learning Objective:       State importance of controlling carbohydrate content of meals and snacks 2.  Behavioral Objective:         Nutrition: To improve blood glucose control I will follow meal plan of 45-60 grams carb for meals and 15-25 grams carb for snacks Sometimes 25% Monitoring: To identify blood glucose trends, I will test blood glucose pre and post meals for one meal Never 0%  Personalized Follow-Up Plan for Ongoing Self Management Support:  Doctor's Office, friends, diabetes magazines or newsletters and CDE visits ______________________________________________________________________   Outcomes Expected outcomes: Demonstrated interest in learning.Expect positive changes in lifestyle.  Self-care Barriers: Lack of transportation, Unsteady gait/risk for falls, Lack of material resources  Education material provided: 2 books and carb counting  pamphlet  Patient to contact team  via Phone if problems or questions.  Time in: 0930     Time out: 1030  Future DSMT - 4-6 wks   [name], Lupita Leash

## 2011-01-05 ENCOUNTER — Other Ambulatory Visit: Payer: Self-pay | Admitting: Internal Medicine

## 2011-01-05 DIAGNOSIS — E119 Type 2 diabetes mellitus without complications: Secondary | ICD-10-CM

## 2011-01-05 MED ORDER — GLUCOSE BLOOD VI STRP: ORAL_STRIP | Status: DC

## 2011-01-05 MED ORDER — ACCU-CHEK FASTCLIX LANCETS MISC: 1.0000 | Freq: Three times a day (TID) | Status: DC

## 2011-01-05 MED ORDER — ACCU-CHEK AVIVA PLUS W/DEVICE KIT: 1.0000 | PACK | Freq: Three times a day (TID) | Status: DC

## 2011-01-06 ENCOUNTER — Other Ambulatory Visit: Payer: Self-pay | Admitting: Internal Medicine

## 2011-01-06 DIAGNOSIS — E119 Type 2 diabetes mellitus without complications: Secondary | ICD-10-CM

## 2011-01-06 MED ORDER — ACCU-CHEK MULTICLIX LANCETS MISC: Status: DC

## 2011-01-06 MED ORDER — GLUCOSE BLOOD VI STRP: ORAL_STRIP | Status: DC

## 2011-01-31 ENCOUNTER — Ambulatory Visit (INDEPENDENT_AMBULATORY_CARE_PROVIDER_SITE_OTHER): Payer: Medicaid Other | Admitting: Dietician

## 2011-01-31 ENCOUNTER — Encounter (HOSPITAL_BASED_OUTPATIENT_CLINIC_OR_DEPARTMENT_OTHER): Payer: Medicaid Other | Attending: Internal Medicine

## 2011-01-31 ENCOUNTER — Other Ambulatory Visit: Payer: Self-pay | Admitting: Internal Medicine

## 2011-01-31 DIAGNOSIS — E119 Type 2 diabetes mellitus without complications: Secondary | ICD-10-CM

## 2011-01-31 DIAGNOSIS — L97409 Non-pressure chronic ulcer of unspecified heel and midfoot with unspecified severity: Secondary | ICD-10-CM | POA: Insufficient documentation

## 2011-01-31 DIAGNOSIS — Z79899 Other long term (current) drug therapy: Secondary | ICD-10-CM | POA: Insufficient documentation

## 2011-01-31 DIAGNOSIS — E1169 Type 2 diabetes mellitus with other specified complication: Secondary | ICD-10-CM | POA: Insufficient documentation

## 2011-01-31 MED ORDER — METFORMIN HCL 1000 MG PO TABS: 1000.0000 mg | ORAL_TABLET | Freq: Two times a day (BID) | ORAL | Status: DC

## 2011-01-31 NOTE — Progress Notes (Signed)
Patient discussed with Lupita Leash Plyler about increasing her Metformin.  Her fasting blood sugars are ranging between 130-190.  We will increase her metformin and encourage her to continue to follow her diet and exercise to work to lose weight.

## 2011-01-31 NOTE — Patient Instructions (Signed)
Please keep records of your food intake, bring meter next visit and make a follow up in 1 month at front desk.  I will call you either way after I hear back form Dr. Tonny Branch.

## 2011-01-31 NOTE — Progress Notes (Signed)
Diabetes Self-Management Training (DSMT)  Initial Visit  01/31/2011 [name], identified by name and date of birth, is a 56 y.o. female with Type 2 Diabetes. Year of diabetes diagnosis: 2011 Other persons present: no  ASSESSMENT Patient concerns are Nutrition/meal planning, Monitoring, Healthy Lifestyle, Problem solving, Glycemic control and Weight control.  Height 5\' 9"  (1.753 m), weight 267 lb 1.6 oz (121.156 kg). Body mass index is 39.44 kg/(m^2). Lab Results  Component Value Date   LDLCALC 86 06/18/2010   Lab Results  Component Value Date   HGBA1C 8.1 12/24/2010   Medication Nutrition Monitor:  accu-chek nano meter  Labs reviewed.  DIABETES BUNDLE: A1C in past 6 months? Yes.  Less than 7%? No LDL in past year? Yes.  Less than 100 mg/dL? Yes Microalbumin ratio in past year? No. Patient taking ACE or ARB? Yes. Blood pressure less than 130/80? Yes. Foot exam in last year? Yes by foot center, No.by our practice Eye exam in past year? Yes. Tobacco use? No. Pneumovax? Yes Flu vaccine? Yes Asprin? Yes  Family history of diabetes: Yes Support systems: needs to assess at next visit Patients belief/attitude about diabetes: Diabetes can be controlled. Self foot exams daily: Yes Diabetes Complications: Neuropathy Retinopathy Special needs: None Prior DM Education: Yes   Medications See Medications list.  Has adequate knowledge   Exercise Plan Doing ADLs, walking and swimming for 15 minutesa day.   Self-Monitoring Frequency of testing: once a day and 2x on one day Breakfast: 138-190s Lunch: 171 today in office Supper: patient reports 130s  Hyperglycemia: Yes Rarely Hypoglycemia: No   Meal Planning Some knowledge and Interested in improving  Assessment comments: says she was on 1000 mg metformin 2x a day until hyperbaric chamber wanted her to increase her blood sugar.  Wants to restart second dose of metformin to address blood sugars while she works on  weight loss behavior change. Wants to try journaling intake to assist with weight loss.   INDIVIDUAL DIABETES EDUCATION PLAN:  Diabetes disease state Nutrition management Medication Monitoring Goal setting _______________________________________________________________________  Intervention TOPICS COVERED TODAY:  Diabetes disease state Explored patient's options for treatment of their diabetes.: MEDICINE CHANGE, INCREASE IN PHYSICAL ACTIVITY AND/OR  MEAL PLANNING Nutrition management  Role of diet in the treatment of diabetes and the relationship between the three main macronutritents and blood glucose control. Food label reading, portion sizes and measuring food. Reviewed blood glucose goals for pre and post meals and how to evaluate the patients' food intake on their blood glucose level. Medication  Reviewed patients medication for diabetes, action, purpose, timing of dose and side effects.  Goal setting  Helped patient develop diabetes management plan for improving her blood sugar through medication and meal planning  PATIENTS GOALS/PLAN (copy and paste in patient instructions so patient receives a copy): 1.  Learning Objective:       State importance of controlling carbohydrate content of meals and snacks- achieved              [employer] [facility]" for daily calorie and fat gram intake for weight loss 2.  Behavioral Objective:         Nutrition: To improve blood glucose control I will follow meal plan of 45-60 grams carb for meals and 15-25 grams carb for snacks Improved per patient to "most of the time" 75% Monitoring: To identify blood glucose trends, I will test blood glucose pre and post meals for one meal most of the time: 75%  Personalized Follow-Up Plan for  Ongoing Self Management Support:  Research officer, trade union, friends, diabetes magazines or newsletters and CDE visits ______________________________________________________________________   Outcomes Expected outcomes: Demonstrated  interest in learning.Expect positive changes in lifestyle. Self-care Barriers: Lack of transportation, Unsteady gait/risk for falls, Lack of material resources Education material provided: 2 books and carb counting pamphlet Patient to contact team via Phone if problems or questions. Time in: 1130     Time out: 1230 Future DSMT - 4 wks x 9 months   Plyler, Lupita Leash

## 2011-02-01 ENCOUNTER — Telehealth: Payer: Self-pay | Admitting: Dietician

## 2011-02-01 NOTE — Telephone Encounter (Signed)
Called patient to relay order toincrease her metformin. She verbalized understanding and that prescription should go to [facility] on [address]. She also shared that she had taken a metformin  last night and started to journal her foods and shared the results:  161 before dinner last night and 101  After dinner 139 this am before breakfast and 169 after.  We discussed targets for fasting blood sugars and post meal blood sugars and that patient is okay to shoot for lower targets because her medicine does not put her at risk for low blood sugars.  It appears the prescription that Dr. Tonny Branch sent was printed- will ask Triage nurse to call it in to be sure.

## 2011-02-03 NOTE — Telephone Encounter (Signed)
The pharmacy has script

## 2011-03-04 ENCOUNTER — Ambulatory Visit: Payer: Medicaid Other | Admitting: Dietician

## 2011-04-15 ENCOUNTER — Other Ambulatory Visit: Payer: Self-pay | Admitting: Internal Medicine

## 2011-04-18 NOTE — Telephone Encounter (Signed)
I will refill things for her but can you please make sure she has a follow up appointment with me as soon as she can.  She is due for a A1C and a foot exam, both of which are rather important for her.

## 2011-05-20 ENCOUNTER — Ambulatory Visit (INDEPENDENT_AMBULATORY_CARE_PROVIDER_SITE_OTHER): Payer: Medicaid Other | Admitting: Internal Medicine

## 2011-05-20 ENCOUNTER — Encounter: Payer: Self-pay | Admitting: Internal Medicine

## 2011-05-20 VITALS — BP 135/80 | HR 91 | Temp 97.4°F | Ht 69.0 in | Wt 257.5 lb

## 2011-05-20 DIAGNOSIS — L97509 Non-pressure chronic ulcer of other part of unspecified foot with unspecified severity: Secondary | ICD-10-CM

## 2011-05-20 DIAGNOSIS — E119 Type 2 diabetes mellitus without complications: Secondary | ICD-10-CM

## 2011-05-20 DIAGNOSIS — I10 Essential (primary) hypertension: Secondary | ICD-10-CM

## 2011-05-20 DIAGNOSIS — Z79899 Other long term (current) drug therapy: Secondary | ICD-10-CM

## 2011-05-20 DIAGNOSIS — L97909 Non-pressure chronic ulcer of unspecified part of unspecified lower leg with unspecified severity: Secondary | ICD-10-CM

## 2011-05-20 DIAGNOSIS — E1169 Type 2 diabetes mellitus with other specified complication: Secondary | ICD-10-CM

## 2011-05-20 MED ORDER — METFORMIN HCL 1000 MG PO TABS: 1000.0000 mg | ORAL_TABLET | Freq: Two times a day (BID) | ORAL | Status: DC

## 2011-05-20 MED ORDER — POTASSIUM CHLORIDE CRYS ER 20 MEQ PO TBCR: 20.0000 meq | EXTENDED_RELEASE_TABLET | Freq: Every day | ORAL | Status: DC

## 2011-05-20 MED ORDER — LISINOPRIL 20 MG PO TABS: 20.0000 mg | ORAL_TABLET | Freq: Every day | ORAL | Status: DC

## 2011-05-20 MED ORDER — FUROSEMIDE 40 MG PO TABS: 40.0000 mg | ORAL_TABLET | Freq: Every day | ORAL | Status: DC

## 2011-05-20 NOTE — Patient Instructions (Addendum)
1.  Continue taking your medications as prescribed.  2.  Keep working on your diet and exercise.  You are doing a great job watching your blood sugars!  3.  When you see Dr. Severe and your eye doctor please have them send me a copy of their reports for our records.  4.  Follow up with me in about 3 months.

## 2011-05-20 NOTE — Progress Notes (Signed)
Subjective:   Patient ID: [name] female   DOB: 02-01-1955 56 y.o.   MRN: [id number]  HPI: [name] is a 56 y.o. woman who presents to clinic today for follow up on her chronic medical conditions including diabetes mellitus type II, hypertension, and history of foot ulcers.  She states that she has been taking her medications as directed and that she has been doing well.  She has had no more problems with open sores on her feet since she finished hyperbaric treatment for the last one.  She also states that she has recently gotten her Medicaid and has gotten a new apartment.    Past Medical History  Diagnosis Date  . Normocytic anemia   . Hypertension   . Diabetes mellitus   . Foot ulcer, left   . Leg edema    Current Outpatient Prescriptions  Medication Sig Dispense Refill  . Blood Glucose Monitoring Suppl (ACCU-CHEK AVIVA PLUS) W/DEVICE KIT 1 each by Does not apply route 3 (three) times daily.  1 kit  0  . Calcium Carbonate-Vitamin D 600-400 MG-UNIT per chew tablet Chew 1 tablet by mouth 2 (two) times daily.  30 tablet  11  . ferrous sulfate 325 (65 FE) MG tablet Take 325 mg by mouth daily. With orange juice       . furosemide (LASIX) 40 MG tablet TAKE 1 TABLET BY MOUTH DAILY  30 tablet  5  . glucose blood (ACCU-CHEK AVIVA PLUS) test strip Use as instructed  100 each  12  . Lancets (ACCU-CHEK MULTICLIX) lancets Use as instructed  100 each  12  . lisinopril (PRINIVIL,ZESTRIL) 20 MG tablet TAKE 1 TABLET BY MOUTH DAILY  30 tablet  5  . metFORMIN (GLUCOPHAGE) 1000 MG tablet Take 1 tablet (1,000 mg total) by mouth 2 (two) times daily with a meal.  60 tablet  6  . Multiple Vitamin (MULTIVITAMIN) tablet Take 1 tablet by mouth daily.        . potassium chloride SA (K-DUR,KLOR-CON) 20 MEQ tablet TAKE 1 TABLET BY MOUTH DAILY  30 tablet  1   Family History  Problem Relation Age of Onset  . Diabetes Mother   . Heart disease Father    History   Social History  . Marital  Status: Single    Spouse Name: N/A    Number of Children: N/A  . Years of Education: N/A   Social History Main Topics  . Smoking status: Former Smoker    Types: Cigarettes  . Smokeless tobacco: None  . Alcohol Use: No  . Drug Use: No  . Sexually Active: No   Other Topics Concern  . 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  Living in a rent controlled apartment.    Review of Systems: Constitutional: Denies fever, chills, diaphoresis, appetite change and fatigue.  HEENT: Denies photophobia, eye pain, redness, hearing loss, ear pain, congestion, sore throat, rhinorrhea, sneezing, mouth sores, trouble swallowing, neck pain, neck stiffness and tinnitus.   Respiratory: Denies SOB, DOE, cough, chest tightness,  and wheezing.   Cardiovascular: Denies chest pain, palpitations and leg swelling.  Gastrointestinal: Denies nausea, vomiting, abdominal pain, diarrhea, constipation, blood in stool and abdominal  distention.  Genitourinary: Denies dysuria, urgency, frequency, hematuria, flank pain and difficulty urinating.  Musculoskeletal: Denies myalgias, back pain, joint swelling, arthralgias and gait problem.  Skin: Denies pallor, rash and wound.  Neurological: Denies dizziness, seizures, syncope, weakness, light-headedness, numbness and headaches.  Hematological: Denies adenopathy. Easy bruising, personal or family bleeding history  Psychiatric/Behavioral: Denies suicidal ideation, mood changes, confusion, nervousness, sleep disturbance and agitation  Objective:  Physical Exam: Filed Vitals:   05/20/11 1542  BP: 135/80  Pulse: 91  Temp: 97.4 F (36.3 C)  TempSrc: Oral    Height: 5\' 9"  (1.753 m)  Weight: 257 lb 8 oz (116.801 kg)   Constitutional: Vital signs reviewed.  Patient is a well-developed and well-nourished woman in no acute distress and cooperative with exam. Alert and oriented x3.  Head: Normocephalic and atraumatic Ear: TM normal bilaterally Mouth: no erythema or exudates, MMM Eyes: PERRL, EOMI, conjunctivae normal, No scleral icterus.  Neck: Supple, Trachea midline normal ROM, No JVD, mass, thyromegaly, or carotid bruit present.  Cardiovascular: RRR, S1 normal, S2 normal, no MRG, pulses symmetric and intact bilaterally Pulmonary/Chest: CTAB, no wheezes, rales, or rhonchi Abdominal: Soft. Non-tender, non-distended, bowel sounds are normal, no masses, organomegaly, or guarding present.  GU: no CVA tenderness Musculoskeletal: No joint deformities, erythema, or stiffness, ROM full and no nontender Hematology: no cervical, inginal, or axillary adenopathy.  Neurological: A&O x3, Strength is normal and symmetric bilaterally, cranial nerve II-XII are grossly intact, no focal motor deficit, sensory intact to light touch bilaterally.  Skin: mild callous formation on the left base of the great toe at the site of previous foot ulcer but no erythema or warmth.  Warm, dry and intact. No rash, cyanosis, or clubbing.  Psychiatric: Normal mood and affect. speech and behavior is normal. Judgment and thought content normal. Cognition and memory are normal.   Assessment & Plan:

## 2011-05-23 ENCOUNTER — Encounter (HOSPITAL_BASED_OUTPATIENT_CLINIC_OR_DEPARTMENT_OTHER): Payer: Medicaid Other | Attending: Internal Medicine

## 2011-05-23 DIAGNOSIS — L84 Corns and callosities: Secondary | ICD-10-CM | POA: Insufficient documentation

## 2011-05-23 DIAGNOSIS — E1169 Type 2 diabetes mellitus with other specified complication: Secondary | ICD-10-CM | POA: Insufficient documentation

## 2011-05-23 DIAGNOSIS — L97509 Non-pressure chronic ulcer of other part of unspecified foot with unspecified severity: Secondary | ICD-10-CM | POA: Insufficient documentation

## 2011-05-23 NOTE — Progress Notes (Signed)
[facility]  NAMEJANAYSHA, [name]              ACCOUNT NO.:  [id number]  MEDICAL RECORD NO.:  [id number]      DATE OF BIRTH:  July 15, 1955  PHYSICIAN:  Jonelle Sports. Corneilus Heggie, M.D.  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 Eventually with hyperbaric oxygen and effective off-loading, we were finally able to get this to heal.  Because she has some foot deformity with increased pressure bearing on the point of that ulceration, we placed her in a patellar tendon bearing, double upright brace fitted to a diabetic shoe with diabetic custom insert.  She is now 5 months post- healing and is here for [place] to reassess the adequacy of her offloading, etc.  She has had no significant illness since her last visit here and has returned to work although she says she spends less time on her feet than before.  She has been quite satisfied with her orthotic device and has noted no unusual pain in that foot.  No drainage and no erythema or other systemic symptoms.  She continues on ranitidine, aspirin, metformin, furosemide, lisinopril, B complex plus C, ferrous sulfate, and K-Dur.  As before, she also takes an over-the-counter multiple vitamin with iron 1 daily.  Her only allergy remains CODEINE.  On examination today, her blood pressure is 124/84, pulse 84, respirations 18, temperature 98.3.  Random blood glucose 139.  Her height 5 feet and 9 inches.  Her weight 257 pounds.  She is alert, cheerful, seem satisfied with her care.  Her foot remains completely healed, although there is some slight reaccumulation of callus in the area of the previous ulceration. Examination of the insert in her footwear shows it is still quite adequate in this area, but  will likely need replacement again in approximately 6 months, of which, she is advised.  In addition, the fit of her double upright PTB seems satisfactory and as does her gait in that device.  I have discussed with her that she may leave it off for short periods as for example to go to church or for a wedding or something of that sort for a portion of a day or sometimes as much as a total day, but this should be the exception certainly rather than the rule.  She will continue to use it most of the time and use it full time when she is working.  We left an open door policy about returning here and have encouraged her to keep her return appointments as scheduled with the orthotist and to insist on refurbishment of the insert on at least an annual basis.          ______________________________ Jonelle Sports Cheryll Cockayne, M.D.     RES/MEDQ  D:  05/23/2011  T:  05/23/2011  Job:  [id number]

## 2011-06-15 ENCOUNTER — Other Ambulatory Visit: Payer: Self-pay | Admitting: Internal Medicine

## 2011-06-20 ENCOUNTER — Encounter (HOSPITAL_BASED_OUTPATIENT_CLINIC_OR_DEPARTMENT_OTHER): Payer: Medicaid Other | Attending: Internal Medicine

## 2011-06-20 DIAGNOSIS — L84 Corns and callosities: Secondary | ICD-10-CM | POA: Insufficient documentation

## 2011-06-20 DIAGNOSIS — E1169 Type 2 diabetes mellitus with other specified complication: Secondary | ICD-10-CM | POA: Insufficient documentation

## 2011-06-20 DIAGNOSIS — L97509 Non-pressure chronic ulcer of other part of unspecified foot with unspecified severity: Secondary | ICD-10-CM | POA: Insufficient documentation

## 2011-07-18 NOTE — Assessment & Plan Note (Signed)
Lab Results  Component Value Date   HGBA1C 7.0 05/20/2011   HGBA1C 8.1 12/24/2010   HGBA1C 6.8 08/25/2010   Lab Results  Component Value Date   MICROALBUR 0.50 05/26/2010   LDLCALC 86 06/18/2010   CREATININE 1.12* 12/24/2010   Her A1C is better controlled today.  She is down about 8 lbs which is likely helping her control.  I congratulated her and encouraged her to continue taking her medications and watching her diet to ensure that she stays in good control.

## 2011-07-18 NOTE — Assessment & Plan Note (Signed)
Lab Results  Component Value Date   NA 138 12/24/2010   K 4.8 12/24/2010   CL 102 12/24/2010   CO2 29 12/24/2010   BUN 23 12/24/2010   CREATININE 1.12* 12/24/2010   CREATININE 0.94 01/22/2010    BP Readings from Last 3 Encounters:  05/20/11 135/80  12/24/10 122/76  10/14/10 130/78    Assessment: Hypertension control:  controlled  Progress toward goals:  at goal Barriers to meeting goals:  no barriers identified  Plan: Hypertension treatment:  continue current medications

## 2011-07-18 NOTE — Assessment & Plan Note (Signed)
She has no indication of an more ulcerations and she has her diabetic shoes which will help her avoid these in the future.  She was encouraged to continue checking her feet every day.

## 2011-09-09 ENCOUNTER — Ambulatory Visit (INDEPENDENT_AMBULATORY_CARE_PROVIDER_SITE_OTHER): Payer: Medicaid Other | Admitting: Internal Medicine

## 2011-09-09 ENCOUNTER — Encounter: Payer: Self-pay | Admitting: Internal Medicine

## 2011-09-09 VITALS — BP 118/76 | HR 83 | Temp 97.2°F | Ht 71.0 in | Wt 261.9 lb

## 2011-09-09 DIAGNOSIS — Z1231 Encounter for screening mammogram for malignant neoplasm of breast: Secondary | ICD-10-CM

## 2011-09-09 DIAGNOSIS — E119 Type 2 diabetes mellitus without complications: Secondary | ICD-10-CM

## 2011-09-09 DIAGNOSIS — Z79899 Other long term (current) drug therapy: Secondary | ICD-10-CM

## 2011-09-09 DIAGNOSIS — I10 Essential (primary) hypertension: Secondary | ICD-10-CM

## 2011-09-09 DIAGNOSIS — D649 Anemia, unspecified: Secondary | ICD-10-CM

## 2011-09-09 DIAGNOSIS — G4733 Obstructive sleep apnea (adult) (pediatric): Secondary | ICD-10-CM

## 2011-09-09 DIAGNOSIS — Z0000 Encounter for general adult medical examination without abnormal findings: Secondary | ICD-10-CM

## 2011-09-09 DIAGNOSIS — R5383 Other fatigue: Secondary | ICD-10-CM

## 2011-09-09 LAB — CBC
Hemoglobin: 11 g/dL — ABNORMAL LOW (ref 12.0–15.0)
MCHC: 33.4 g/dL (ref 30.0–36.0)
RBC: 3.91 MIL/uL (ref 3.87–5.11)
WBC: 7.9 10*3/uL (ref 4.0–10.5)

## 2011-09-09 NOTE — Progress Notes (Signed)
Subjective:   Patient ID: [name] female   DOB: August 01, 1955 56 y.o.   MRN: [id number]  HPI: [name] is a 56 y.o. woman who presents to clinic today for follow up of her chronic medical conditions including diabetes, hypertension, and anemia.  She states that she has been taking her medications as prescribed.  She has been taking her metformin as prescribed and her blood sugars have been much better controlled since her last foot ulcer has resolved.  She states that she has been eating more fruits and vegetables and is trying to exercise more by walking.  She states that she checks her blood sugars 1-2 times daily and they are doing well.    She is concerned about some decreased energy over the last few weeks.  She states that her appetite is down as well.  She states that she just feels worn out most days.  She falls asleep very quickly if she is not doing something active.  She usually tries to go to bed between 10 and 11 pm and is usually up around 4 to 5 am.  She usually feels rested in the day but will take naps during the day.  She denies unintentional weight loss, weight gain, diarrhea, or adenopathy.  She is not a smoker and does not drink alcohol or use any other drugs.  She states that she had an oophorectomy at age 39 because of cysts but still has her uterus.  She has noted some mild thinning of her hair and some dryness of her skin.  She has a history of a normocytic anemia that is likely anemia of chronic inflammation.    She continues to use her left leg brace to try to keep herself from getting callous on her feet since she has a history of multiple left foot ulcers.  She was released from wound care and is followed intermittently by podiatry. She has not noted any drainage, erythema, or pain in the foot.    Past Medical History  Diagnosis Date  . Normocytic anemia   . Hypertension   . Diabetes mellitus   . Foot ulcer, left   . Leg edema    Current Outpatient  Prescriptions  Medication Sig Dispense Refill  . Blood Glucose Monitoring Suppl (ACCU-CHEK AVIVA PLUS) W/DEVICE KIT 1 each by Does not apply route 3 (three) times daily.  1 kit  0  . Calcium Carbonate-Vitamin D 600-400 MG-UNIT per chew tablet Chew 1 tablet by mouth 2 (two) times daily.  30 tablet  11  . ferrous sulfate 325 (65 FE) MG tablet Take 325 mg by mouth daily. With orange juice       . furosemide (LASIX) 40 MG tablet Take 1 tablet (40 mg total) by mouth daily.  30 tablet  5  . glucose blood (ACCU-CHEK AVIVA PLUS) test strip Use as instructed  100 each  12  . Lancets (ACCU-CHEK MULTICLIX) lancets Use as instructed  100 each  12  . lisinopril (PRINIVIL,ZESTRIL) 20 MG tablet Take 1 tablet (20 mg total) by mouth daily.  30 tablet  5  . metFORMIN (GLUCOPHAGE) 1000 MG tablet Take 1 tablet (1,000 mg total) by mouth 2 (two) times daily with a meal.  60 tablet  6  . Multiple Vitamin (MULTIVITAMIN) tablet Take 1 tablet by mouth daily.        . potassium chloride SA (K-DUR,KLOR-CON) 20 MEQ tablet Take 1 tablet (20 mEq total) by mouth daily.  30 tablet  6  . potassium chloride SA (K-DUR,KLOR-CON) 20 MEQ tablet Take 1 tablet (20 mEq total) by mouth daily.  30 tablet  6   Family History  Problem Relation Age of Onset  . Diabetes Mother   . Heart disease Father    History   Social History  . Marital Status: Single    Spouse Name: N/A    Number of Children: N/A  . Years of Education: N/A   Social History Main Topics  . Smoking status: Former Smoker    Types: Cigarettes  . Smokeless tobacco: None  . Alcohol Use: No  . Drug Use: No  . Sexually Active: No   Other Topics Concern  . 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  Living in a rent controlled apartment.    Review of Systems: Constitutional: Positive for appetite change and fatigue. Denies fever, chills, diaphoresis HEENT: Denies photophobia, eye pain, redness, hearing loss, ear pain, congestion, sore throat, rhinorrhea, sneezing, mouth sores, trouble swallowing, neck pain, neck stiffness and tinnitus.   Respiratory: Denies SOB, DOE, cough, chest tightness,  and wheezing.   Cardiovascular: Denies chest pain, palpitations and leg swelling.  Gastrointestinal: Denies nausea, vomiting, abdominal pain, diarrhea, constipation, blood in stool and abdominal distention.  Genitourinary: Denies dysuria, urgency, frequency, hematuria, flank pain and difficulty urinating.  Musculoskeletal: Denies myalgias, back pain, joint swelling, arthralgias and gait problem.  Skin: Denies pallor, rash and wound.  Neurological: Denies dizziness, seizures, syncope, weakness, light-headedness, numbness and headaches.  Hematological: Denies adenopathy. Easy bruising, personal or family bleeding history  Psychiatric/Behavioral: Denies suicidal ideation, mood changes, confusion, nervousness, sleep disturbance and agitation  Objective:  Physical Exam: Filed Vitals:   09/09/11 1451  BP: 118/76  Pulse: 83  Temp: 97.2 F (36.2 C)  TempSrc: Oral  Height: 5\' 11"  (1.803 m)  Weight: 261 lb 14.4 oz (118.797 kg)   Constitutional: Vital signs reviewed.  Patient is a well-developed and well-nourished morbidly obese woman in no acute distress and cooperative with exam. Alert and oriented x3.  Head: Normocephalic and atraumatic Ear: TM normal bilaterally Mouth: no erythema or exudates, MMM, moderate redundant tissue in the posterior pharynx.   Eyes: PERRL, EOMI, conjunctivae normal, No scleral icterus.  Neck: Supple, Trachea midline normal ROM, No JVD, mass, thyromegaly, or carotid bruit present.  Cardiovascular: RRR, S1 normal, S2  normal, no MRG, pulses symmetric and intact bilaterally Pulmonary/Chest: CTAB, no wheezes, rales, or rhonchi Abdominal: Soft. Non-tender, non-distended, bowel sounds are normal, no masses, organomegaly, or guarding present.  GU: no CVA tenderness Musculoskeletal: No joint deformities, erythema, or stiffness, ROM full and no nontender Hematology: no cervical, inginal, or axillary adenopathy.  Neurological: A&O x3, Strength is normal and symmetric bilaterally, cranial nerve II-XII are grossly intact, no focal motor deficit, sensory intact to light touch bilaterally.  Skin: See HM foot exam for foot findings.  Warm, dry and intact. No rash, cyanosis, or clubbing.  Psychiatric: Normal mood and affect. speech and behavior is normal. Judgment and thought content normal. Cognition and memory are normal.   Assessment & Plan:

## 2011-09-09 NOTE — Patient Instructions (Addendum)
1.  Continue all of your medications as prescribed.  2.  Stop in the lab to have your blood drawn.  I will call you if we need to follow up on anything.  If everything is negative I will order the sleep study and we can follow up in the middle part of September.  3.  Keep up the good work with your blood sugars!  4.  Follow up in the middle part of September.

## 2011-09-09 NOTE — Patient Instructions (Signed)
1.  Stop in the lab to have your blood drawn.  I will call you if we need to follow up on anything.  If the testing is negative I will order the sleep study for you.  2.  Continue your medications as prescribed for now.  You are doing great with your blood sugars and blood pressure.    3.

## 2011-09-10 LAB — TSH: TSH: 0.824 u[IU]/mL (ref 0.350–4.500)

## 2011-09-10 LAB — LIPID PANEL
Cholesterol: 160 mg/dL (ref 0–200)
HDL: 44 mg/dL (ref >39)
Total CHOL/HDL Ratio: 3.6 Ratio
Triglycerides: 113 mg/dL (ref <150)
VLDL: 23 mg/dL (ref 0–40)

## 2011-09-10 LAB — COMPREHENSIVE METABOLIC PANEL
ALT: <8 U/L (ref 0–35)
AST: 16 U/L (ref 0–37)
Calcium: 10.9 mg/dL — ABNORMAL HIGH (ref 8.4–10.5)
Chloride: 103 mEq/L (ref 96–112)
Creat: 1.45 mg/dL — ABNORMAL HIGH (ref 0.50–1.10)
Potassium: 4.9 mEq/L (ref 3.5–5.3)
Sodium: 139 mEq/L (ref 135–145)

## 2011-09-10 LAB — T4, FREE: Free T4: 1.14 ng/dL (ref 0.80–1.80)

## 2011-09-22 ENCOUNTER — Other Ambulatory Visit: Payer: Self-pay | Admitting: Internal Medicine

## 2011-09-22 DIAGNOSIS — Z1231 Encounter for screening mammogram for malignant neoplasm of breast: Secondary | ICD-10-CM

## 2011-09-28 ENCOUNTER — Ambulatory Visit (HOSPITAL_BASED_OUTPATIENT_CLINIC_OR_DEPARTMENT_OTHER): Payer: Medicaid Other | Attending: Internal Medicine | Admitting: Radiology

## 2011-09-28 VITALS — Ht 70.0 in | Wt 257.0 lb

## 2011-09-28 DIAGNOSIS — G4737 Central sleep apnea in conditions classified elsewhere: Secondary | ICD-10-CM | POA: Insufficient documentation

## 2011-09-28 DIAGNOSIS — G4733 Obstructive sleep apnea (adult) (pediatric): Secondary | ICD-10-CM | POA: Insufficient documentation

## 2011-09-28 DIAGNOSIS — R5383 Other fatigue: Secondary | ICD-10-CM

## 2011-10-01 DIAGNOSIS — G4737 Central sleep apnea in conditions classified elsewhere: Secondary | ICD-10-CM

## 2011-10-01 DIAGNOSIS — G4733 Obstructive sleep apnea (adult) (pediatric): Secondary | ICD-10-CM

## 2011-10-02 NOTE — Procedures (Signed)
[name], [name]              ACCOUNT NO.:  [id number]  MEDICAL RECORD NO.:  [id number]          PATIENT TYPE:  OUT  LOCATION:  SLEEP CENTER                 FACILITY:  [facility]  PHYSICIAN:  Linsi Humann D. Maple Hudson, MD, FCCP, FACPDATE OF BIRTH:  04-06-1955  DATE OF STUDY:  09/28/2011                           NOCTURNAL POLYSOMNOGRAM  REFERRING PHYSICIAN:  Leodis Sias, MD  INDICATION FOR STUDY:  Hypersomnia with sleep apnea.  EPWORTH SLEEPINESS SCORE:  15/24.  BMI 36.9, weight 257 pounds, height 70 inches, neck 14.5 inches.  MEDICATIONS:  Home medications are charted and reviewed.  SLEEP ARCHITECTURE:  Total sleep time 354 minutes with sleep efficiency 91.2%.  Stage I was 6.6%, stage II 75.1%, stage III absent, REM 18.2% of total sleep time.  Sleep latency 1 minute.  REM latency 134.5 minutes, awake after sleep onset 32.5 minutes.  Arousal index 10.3.  Bedtime Medication:  None.  At 4:08 a.m. two Tums were taken for heartburn.  RESPIRATORY DATA:  Apnea-hypopnea index (AHI) 14.1 per hour.  A total of 83 events were scored including 20 obstructive apneas, 10 central apneas, 53 hypopneas.  Events were seen in all sleep positions.  REM/AHI 39.1 per hour.  There were insufficient numbers of early events to meet protocol requirements for initiation of split protocol, CPAP titration on this study night.  OXYGEN DATA:  Moderate to loud intermittent snoring with oxygen desaturation to a nadir of 75% and mean oxygen saturation through the study of 94.1% on room air.  CARDIAC DATA:  Normal sinus rhythm.  MOVEMENT-PARASOMNIA:  No significant movement disturbance.  No bathroom trips.  IMPRESSIONS-RECOMMENDATIONS: 1. 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  REM/AHI 39.1 per hour.     Moderate-to-occasionally loud intermittent snoring with oxygen     desaturation to a nadir of 75% and mean oxygen saturation  through     the study of 94.1% on room air. 2. There were insufficient events to meet protocol requirements for     initiation of split protocol CPAP titration     during the time window required on the study night.  Consider     return for dedicated CPAP titration study or evaluate for     alternative management as clinically appropriate.     Klea Nall D. Maple Hudson, MD, [facility], FACP Diplomate, American Board of Sleep Medicine    CDY/MEDQ  D:  10/01/2011 10:27:01  T:  10/02/2011 07:43:20  Job:  [id number]

## 2011-10-07 ENCOUNTER — Ambulatory Visit (INDEPENDENT_AMBULATORY_CARE_PROVIDER_SITE_OTHER): Payer: Medicaid Other | Admitting: Internal Medicine

## 2011-10-07 VITALS — BP 123/78 | HR 87 | Temp 97.6°F | Ht 71.0 in | Wt 267.5 lb

## 2011-10-07 DIAGNOSIS — E119 Type 2 diabetes mellitus without complications: Secondary | ICD-10-CM

## 2011-10-07 DIAGNOSIS — G4733 Obstructive sleep apnea (adult) (pediatric): Secondary | ICD-10-CM

## 2011-10-07 DIAGNOSIS — Z23 Encounter for immunization: Secondary | ICD-10-CM

## 2011-10-07 DIAGNOSIS — I10 Essential (primary) hypertension: Secondary | ICD-10-CM

## 2011-10-07 LAB — GLUCOSE, CAPILLARY: Glucose-Capillary: 132 mg/dL — ABNORMAL HIGH (ref 70–99)

## 2011-10-07 NOTE — Patient Instructions (Signed)
1.  We will work to get you set up for the CPAP mask titration to help with your sleep.  - If we need to see you before I can give you the machine We will schedule that then.    2.  Continue your medications as prescribed.  3.  Follow up in the end of November or first part of December for your diabetes.

## 2011-10-07 NOTE — Progress Notes (Signed)
Subjective:   Patient ID: [name] female   DOB: October 29, 1955 56 y.o.   MRN: [id number]  HPI: [name] is a 56 y.o. woman who presents to clinic today for follow up from her last appointment.  She underwent a sleep study on 09/28/2011 which found that she has mild OSA with an AHI of 14.1 per hour with 20 being obstructive apneas, 10 central apneas, and 53 hypopnea.  The dictation by Dr. Maple Hudson states that she did not have enough early episodes to trigger the split night study so she will need to return for CPAP titration.  She states that she continues to have daytime somnolence, falling asleep very easily if she is not doing anything active.    She states that she is otherwise doing well.  She continues to take her metformin 1000 mg BID and has been taking her blood pressure medications as prescribed.  She denies headaches, shortness of breath, chest pain, blurry vision, increased thirst, or polyuria.    Past Medical History  Diagnosis Date  . Normocytic anemia   . Hypertension   . Diabetes mellitus   . Foot ulcer, left   . Leg edema    Current Outpatient Prescriptions  Medication Sig Dispense Refill  . Blood Glucose Monitoring Suppl (ACCU-CHEK AVIVA PLUS) W/DEVICE KIT 1 each by Does not apply route 3 (three) times daily.  1 kit  0  . Calcium Carbonate-Vitamin D 600-400 MG-UNIT per chew tablet Chew 1 tablet by mouth 2 (two) times daily.  30 tablet  11  . ferrous sulfate 325 (65 FE) MG tablet Take 325 mg by mouth daily. With orange juice       . furosemide (LASIX) 40 MG tablet Take 1 tablet (40 mg total) by mouth daily.  30 tablet  5  . glucose blood (ACCU-CHEK AVIVA PLUS) test strip Use as instructed  100 each  12  . Lancets (ACCU-CHEK MULTICLIX) lancets Use as instructed  100 each  12  . lisinopril (PRINIVIL,ZESTRIL) 20 MG tablet Take 1 tablet (20 mg total) by mouth daily.  30 tablet  5  . metFORMIN (GLUCOPHAGE) 1000 MG tablet Take 1 tablet (1,000 mg total) by mouth 2  (two) times daily with a meal.  60 tablet  6  . Multiple Vitamin (MULTIVITAMIN) tablet Take 1 tablet by mouth daily.        . potassium chloride SA (K-DUR,KLOR-CON) 20 MEQ tablet Take 1 tablet (20 mEq total) by mouth daily.  30 tablet  6  . potassium chloride SA (K-DUR,KLOR-CON) 20 MEQ tablet Take 1 tablet (20 mEq total) by mouth daily.  30 tablet  6   Family History  Problem Relation Age of Onset  . Diabetes Mother   . Heart disease Father    History   Social History  . Marital Status: Single    Spouse Name: N/A    Number of Children: N/A  . Years of Education: N/A   Social History Main Topics  . Smoking status: Former Smoker    Types: Cigarettes  . Smokeless tobacco: Not on file  . Alcohol Use: No  . Drug Use: No  . Sexually Active: No   Other Topics Concern  . 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 and has [facility] card per Gavin [place]10/14/2011She was just approved for Disability and is looking for  an income controlled housing so she can move out of the shelter.  Living in a rent controlled apartment.    Review of Systems: Constitutional: Denies fever, chills, diaphoresis, appetite change and fatigue.  HEENT: Denies photophobia, eye pain, redness, hearing loss, ear pain, congestion, sore throat, rhinorrhea, sneezing, mouth sores, trouble swallowing, neck pain, neck stiffness and tinnitus.   Respiratory: Denies SOB, DOE, cough, chest tightness,  and wheezing.   Cardiovascular: Denies chest pain, palpitations and leg swelling.  Gastrointestinal: Denies nausea, vomiting, abdominal pain, diarrhea, constipation, blood in stool and abdominal distention.  Genitourinary: Denies dysuria, urgency, frequency, hematuria, flank pain and difficulty urinating.  Musculoskeletal: Denies myalgias, back pain, joint swelling,  arthralgias and gait problem.  Skin: Denies pallor, rash and wound.  Neurological: Denies dizziness, seizures, syncope, weakness, light-headedness, numbness and headaches.  Hematological: Denies adenopathy. Easy bruising, personal or family bleeding history  Psychiatric/Behavioral: Denies suicidal ideation, mood changes, confusion, nervousness, sleep disturbance and agitation  Objective:  Physical Exam: Filed Vitals:   10/07/11 1524  BP: 123/78  Pulse: 87  Temp: 97.6 F (36.4 C)  TempSrc: Oral  Height: 5\' 11"  (1.803 m)  Weight: 267 lb 8 oz (121.337 kg)  SpO2: 100%   Constitutional: Vital signs reviewed.  Patient is a well-developed and well-nourished woman in no acute distress and cooperative with exam. Alert and oriented x3.  Head: Normocephalic and atraumatic Ear: TM normal bilaterally Mouth: no erythema or exudates, MMM Eyes: PERRL, EOMI, conjunctivae normal, No scleral icterus.  Neck: Supple, Trachea midline normal ROM, No JVD, mass, thyromegaly, or carotid bruit present.  Cardiovascular: RRR, S1 normal, S2 normal, no MRG, pulses symmetric and intact bilaterally Pulmonary/Chest: CTAB, no wheezes, rales, or rhonchi Abdominal: Soft. Non-tender, non-distended, bowel sounds are normal, no masses, organomegaly, or guarding present.  GU: no CVA tenderness Musculoskeletal: No joint deformities, erythema, or stiffness, ROM full and no nontender Hematology: no cervical, inginal, or axillary adenopathy.  Neurological: A&O x3, Strength is normal and symmetric bilaterally, cranial nerve II-XII are grossly intact, no focal motor deficit, sensory intact to light touch bilaterally.  Skin: Warm, dry and intact. No rash, cyanosis, or clubbing.  Psychiatric: Normal mood and affect. speech and behavior is normal. Judgment and thought content normal. Cognition and memory are normal.   Assessment & Plan:

## 2011-10-10 ENCOUNTER — Ambulatory Visit (HOSPITAL_COMMUNITY)
Admission: RE | Admit: 2011-10-10 | Discharge: 2011-10-10 | Disposition: A | Discharge status: Home / Self Care | Payer: Medicaid Other | Source: Ambulatory Visit | Attending: Internal Medicine | Admitting: Internal Medicine

## 2011-10-10 DIAGNOSIS — Z1231 Encounter for screening mammogram for malignant neoplasm of breast: Secondary | ICD-10-CM

## 2011-10-11 ENCOUNTER — Other Ambulatory Visit: Payer: Self-pay | Admitting: Internal Medicine

## 2011-10-11 DIAGNOSIS — I10 Essential (primary) hypertension: Secondary | ICD-10-CM

## 2011-10-11 DIAGNOSIS — R609 Edema, unspecified: Secondary | ICD-10-CM

## 2011-10-11 DIAGNOSIS — E119 Type 2 diabetes mellitus without complications: Secondary | ICD-10-CM

## 2011-10-23 NOTE — Assessment & Plan Note (Addendum)
Lab Results  Component Value Date   CHOL 160 09/09/2011   CHOL 157 06/18/2010   CHOL 151 06/17/2009   Lab Results  Component Value Date   HDL 44 09/09/2011   HDL 59 01/22/1094   HDL 46 06/17/2009   Lab Results  Component Value Date   LDLCALC 93 09/09/2011   LDLCALC 86 06/18/2010   LDLCALC 84 06/17/2009   Lab Results  Component Value Date   TRIG 113 09/09/2011   TRIG 62 06/18/2010   TRIG 105 06/17/2009   Lab Results  Component Value Date   CHOLHDL 3.6 09/09/2011   CHOLHDL 2.7 06/18/2010   CHOLHDL 3.3 Ratio 06/17/2009   No results found for this basename: LDLDIRECT   Her cholesterol is well controlled without medications. We will continue to monitor on a yearly basis.

## 2011-10-23 NOTE — Assessment & Plan Note (Signed)
Lab Results  Component Value Date   HGBA1C 7.1 09/09/2011   HGBA1C 5.7 12/17/2009   CREATININE 1.45* 09/09/2011   CREATININE 0.94 01/22/2010   MICROALBUR 0.50 05/26/2010   MICRALBCREAT 9.6 05/26/2010   CHOL 160 09/09/2011   HDL 44 09/09/2011   TRIG 113 09/09/2011    Last eye exam and foot exam:    Component Value Date/Time   HMDIABEYEEXA normal 12/29/2009   HMDIABFOOTEX done 12/29/2009    Assessment: Diabetes control: controlled Progress toward goals: at goal Barriers to meeting goals: no barriers identified  Plan: Diabetes treatment: continue current medications Refer to: none Instruction/counseling given: reminded to get eye exam and discussed foot care

## 2011-10-23 NOTE — Assessment & Plan Note (Signed)
Lab Results  Component Value Date   WBC 7.9 09/09/2011   HGB 11.0* 09/09/2011   HCT 32.9* 09/09/2011   MCV 84.1 09/09/2011   PLT 256 09/09/2011   Her HgB is actually up from her previous and she continues to be normocytic.  This is likely secondary to her long standing foot ulcers and the chronic inflammation associated with them.

## 2011-10-23 NOTE — Assessment & Plan Note (Signed)
>>  ASSESSMENT AND PLAN FOR NORMOCYTIC ANEMIA WRITTEN ON 10/23/2011  9:11 PM BY PRIBULA, CHRISTOPHER, MD  Lab Results  Component Value Date   WBC 7.9 09/09/2011   HGB 11.0* 09/09/2011   HCT 32.9* 09/09/2011   MCV 84.1 09/09/2011   PLT 256 09/09/2011   Her HgB is actually up from her previous and she continues to be normocytic.  This is likely secondary to her long standing foot ulcers and the chronic inflammation associated with them.

## 2011-10-23 NOTE — Assessment & Plan Note (Signed)
Lab Results  Component Value Date   TSH 0.824 09/09/2011   CBC:    Component Value Date/Time   WBC 7.9 09/09/2011 1550   HGB 11.0* 09/09/2011 1550   HCT 32.9* 09/09/2011 1550   PLT 256 09/09/2011 1550   MCV 84.1 09/09/2011 1550   NEUTROABS 2.9 06/18/2010 0934   LYMPHSABS 1.9 06/18/2010 0934   MONOABS 0.4 06/18/2010 0934   EOSABS 0.2 06/18/2010 0934   BASOSABS 0.0 06/18/2010 0934   Her TSH and free t4 are normal and her anemia is improving.  She has some mild AKI that is not far above her baseline but will need to be monitored.  She is up to date on her cancer screenings and has no other warning signs for malignancy.  We will refer for a sleep study and see if OSA is the cause of her day time somnolence.

## 2011-10-25 ENCOUNTER — Ambulatory Visit (HOSPITAL_BASED_OUTPATIENT_CLINIC_OR_DEPARTMENT_OTHER): Payer: Medicaid Other | Attending: Internal Medicine | Admitting: Radiology

## 2011-10-25 VITALS — Ht 71.0 in | Wt 267.0 lb

## 2011-10-25 DIAGNOSIS — G4730 Sleep apnea, unspecified: Secondary | ICD-10-CM | POA: Insufficient documentation

## 2011-10-25 DIAGNOSIS — G4733 Obstructive sleep apnea (adult) (pediatric): Secondary | ICD-10-CM

## 2011-10-25 DIAGNOSIS — G4710 Hypersomnia, unspecified: Secondary | ICD-10-CM | POA: Insufficient documentation

## 2011-10-29 ENCOUNTER — Encounter (HOSPITAL_COMMUNITY): Payer: Self-pay | Admitting: *Deleted

## 2011-10-29 ENCOUNTER — Emergency Department (HOSPITAL_COMMUNITY)
Admission: EM | Admit: 2011-10-29 | Discharge: 2011-10-29 | Disposition: A | Discharge status: Home / Self Care | Payer: Medicaid Other | Attending: Emergency Medicine | Admitting: Emergency Medicine

## 2011-10-29 DIAGNOSIS — G4733 Obstructive sleep apnea (adult) (pediatric): Secondary | ICD-10-CM

## 2011-10-29 DIAGNOSIS — E119 Type 2 diabetes mellitus without complications: Secondary | ICD-10-CM | POA: Insufficient documentation

## 2011-10-29 DIAGNOSIS — T1590XA Foreign body on external eye, part unspecified, unspecified eye, initial encounter: Secondary | ICD-10-CM | POA: Insufficient documentation

## 2011-10-29 DIAGNOSIS — S058X9A Other injuries of unspecified eye and orbit, initial encounter: Secondary | ICD-10-CM | POA: Insufficient documentation

## 2011-10-29 DIAGNOSIS — I4949 Other premature depolarization: Secondary | ICD-10-CM

## 2011-10-29 DIAGNOSIS — S0500XA Injury of conjunctiva and corneal abrasion without foreign body, unspecified eye, initial encounter: Secondary | ICD-10-CM

## 2011-10-29 DIAGNOSIS — I10 Essential (primary) hypertension: Secondary | ICD-10-CM | POA: Insufficient documentation

## 2011-10-29 DIAGNOSIS — Z87891 Personal history of nicotine dependence: Secondary | ICD-10-CM | POA: Insufficient documentation

## 2011-10-29 MED ORDER — TETRACAINE HCL 0.5 % OP SOLN
2.0000 [drp] | Freq: Once | OPHTHALMIC | Status: AC
  Administered 2011-10-29: 2 [drp] via OPHTHALMIC
  Filled 2011-10-29: qty 2

## 2011-10-29 MED ORDER — FLUORESCEIN SODIUM 1 MG OP STRP
ORAL_STRIP | OPHTHALMIC | Status: AC
  Administered 2011-10-29: 1 via OPHTHALMIC
  Filled 2011-10-29: qty 1

## 2011-10-29 MED ORDER — DIPHENHYDRAMINE HCL 25 MG PO TABS: 25.0000 mg | ORAL_TABLET | Freq: Four times a day (QID) | ORAL | Status: DC

## 2011-10-29 MED ORDER — IBUPROFEN 400 MG PO TABS: 400.0000 mg | ORAL_TABLET | Freq: Four times a day (QID) | ORAL | Status: DC | PRN

## 2011-10-29 MED ORDER — HYDROCODONE-ACETAMINOPHEN 5-325 MG PO TABS: 1.0000 | ORAL_TABLET | ORAL | Status: DC | PRN

## 2011-10-29 MED ORDER — FLUORESCEIN SODIUM 1 MG OP STRP
1.0000 | ORAL_STRIP | Freq: Once | OPHTHALMIC | Status: AC
  Administered 2011-10-29: 1 via OPHTHALMIC

## 2011-10-29 MED ORDER — ERYTHROMYCIN 5 MG/GM OP OINT: TOPICAL_OINTMENT | Freq: Every day | OPHTHALMIC | Status: DC

## 2011-10-29 NOTE — Procedures (Signed)
NAMEJOSIE, [name]              ACCOUNT NO.:  [id number]  MEDICAL RECORD NO.:  [id number]          PATIENT TYPE:  OUT  LOCATION:  SLEEP CENTER                 FACILITY:  [facility]  PHYSICIAN:  Clinton D. Maple Hudson, MD, FCCP, FACPDATE OF BIRTH:  01-[birth year]-1955  DATE OF STUDY:  10/25/2011                           NOCTURNAL POLYSOMNOGRAM  REFERRING PHYSICIAN:  Leodis Sias, MD  REFERRING PHYSICIAN:  Leodis Sias, MD  INDICATION FOR STUDY:  Hypersomnia with sleep apnea.  EPWORTH SLEEPINESS SCORE:  15/24.  BMI 36.9, weight 257 pounds, height 70 inches, neck 14.5 inches.  MEDICATIONS:  Home medications are charted and reviewed.  A baseline diagnostic NPSG on September 28, 2011 had recorded AHI 14.1 per hour.  Body weight for that study was 257 pounds.  CPAP titration is now requested.  SLEEP ARCHITECTURE:  Total sleep time 360.5 minutes with sleep efficiency 88.6%.  Stage I was 5.1%, stage II 70.9%.  Stage III absent, REM 24% of total sleep time.  Sleep latency 7 minutes.  REM latency 65 minutes.  Awake after sleep onset 40 minutes.  Arousal index 13.3. Bedtime medication:  None.  RESPIRATORY DATA:  CPAP titration protocol.  CPAP was titrated to 17 CWP with residual AHI 40 per hour.  The technician then changed to bilevel (BiPAP) and titrated to a final inspiratory pressure of 22 CWP and expiratory pressure of 16 CWP.  AHI of 32.9 per hour reflecting residual central apneas which are not expected to respond to positive airway pressure therapy.  She wore a medium ResMed Quattro FX full-face mask with heated humidifier.  OXYGEN DATA:  Snoring was prevented by CPAP/BIPAP with a mean oxygen saturation of 97.1% on room air.  CARDIAC DATA:  Sinus rhythm with PVCs.  MOVEMENT/PARASOMNIA:  No significant movement disturbance.  Bathroom x1.  IMPRESSION/RECOMMENDATION: 1. CPAP was titrated to 17 CWP with inadequate control and residual     AHI of 40 per hour.  She was then  changed to bilevel (BiPAP) and     titrated to an inspiratory pressure of 22 CWP and expiratory     pressure of 16 CWP for a residual AHI of 32.9 per hour.  This     reflected residual central apneas, which would not be expected to     respond to positive airway pressure therapy.  Recommend this     setting for initial home treatments.  She wore a medium ResMed     Quattro FX full-face mask with heated humidifier. 2. Significant numbers of central apneas can reflect impaired     cerebrovascular blood flow in some patients. 3. Baseline diagnostic NPSG on September 28, 2011 recorded AHI 14.1     per hour.  Body weight for that study was 257 pounds.     Clinton D. Maple Hudson, MD, [facility], FACP Diplomate, American Board of Sleep Medicine    CDY/MEDQ  D:  10/29/2011 10:54:28  T:  10/29/2011 16:10:96  Job:  [id number]

## 2011-10-29 NOTE — ED Provider Notes (Signed)
History    This chart was scribed for Derwood Kaplan, MD, MD by Smitty Pluck. The patient was seen in room TR02C and the patient's care was started at 6:34PM.   CSN: [id number]  Arrival date & time 10/29/11  1623       Chief Complaint  Patient presents with  . Eye Pain    (Consider location/radiation/quality/duration/timing/severity/associated sxs/prior treatment) Patient is a 56 y.o. female presenting with eye pain. The history is provided by the patient. No language interpreter was used.  Eye Pain   [name] is a 56 y.o. female with hx of DM and HTN who presents to the Emergency Department complaining of constant, moderate left eye pain onset today 3 hours ago due to getting glue in her eye. Describes the pain as burning. Pt reports that she opened glue container and it shot into her eye. Denies visual disturbances and any other pain.   Past Medical History  Diagnosis Date  . Normocytic anemia   . Hypertension   . Diabetes mellitus   . Foot ulcer, left   . Leg edema     Past Surgical History  Procedure Date  . Oophrectomy 1975  . Appendectomy     Family History  Problem Relation Age of Onset  . Diabetes Mother   . Heart disease Father     History  Substance Use Topics  . Smoking status: Former Smoker    Types: Cigarettes  . Smokeless tobacco: Not on file  . Alcohol Use: No    OB History    Grav Para Term Preterm Abortions TAB SAB Ect Mult Living                  Review of Systems  Eyes: Positive for pain.  All other systems reviewed and are negative.    Allergies  Codeine  Home Medications   Current Outpatient Rx  Name Route Sig Dispense Refill  . ACCU-CHEK AVIVA PLUS W/DEVICE KIT Does not apply 1 each by Does not apply route 3 (three) times daily. 1 kit 0  . CALCIUM CARBONATE-VITAMIN D 600-400 MG-UNIT PO CHEW Oral Chew 1 tablet by mouth 2 (two) times daily. 30 tablet 11  . FERROUS SULFATE 325 (65 FE) MG PO TABS Oral Take 325 mg by  mouth daily. With orange juice     . FUROSEMIDE 40 MG PO TABS Oral Take 1 tablet (40 mg total) by mouth daily. 30 tablet 4  . GLUCOSE BLOOD VI STRP  Use as instructed 100 each 12  . ACCU-CHEK MULTICLIX LANCETS MISC  Use as instructed 100 each 12  . LISINOPRIL 20 MG PO TABS Oral Take 1 tablet (20 mg total) by mouth daily. 30 tablet 4  . METFORMIN HCL 1000 MG PO TABS Oral Take 1 tablet (1,000 mg total) by mouth 2 (two) times daily with a meal. 60 tablet 5  . ONE-DAILY MULTI VITAMINS PO TABS Oral Take 1 tablet by mouth daily.      Marland Kitchen POTASSIUM CHLORIDE CRYS ER 20 MEQ PO TBCR Oral Take 1 tablet (20 mEq total) by mouth daily. 30 tablet 6    CYCLE FILL MEDICATION. Authorization is required f ...  . POTASSIUM CHLORIDE CRYS ER 20 MEQ PO TBCR Oral Take 1 tablet (20 mEq total) by mouth daily. 30 tablet 6    CYCLE FILL MEDICATION. Authorization is required f ...    BP 147/80  Pulse 92  Temp 97.8 F (36.6 C) (Oral)  Resp 20  SpO2  98%  Physical Exam  Nursing note and vitals reviewed. Constitutional: She is oriented to person, place, and time. She appears well-developed and well-nourished. No distress.  HENT:  Head: Normocephalic and atraumatic.  Eyes:       fluorescein test shows punctate area in cornea that is absorbing the dye. The same area is noted to be abnormal with the lesion without the dye. No sclera injection.   Neck: Normal range of motion. Neck supple.  Pulmonary/Chest: Effort normal. No respiratory distress.  Neurological: She is alert and oriented to person, place, and time.  Skin: Skin is warm and dry.  Psychiatric: She has a normal mood and affect. Her behavior is normal.    ED Course  Procedures (including critical care time) DIAGNOSTIC STUDIES: Oxygen Saturation is 98% on room air, normal by my interpretation.    COORDINATION OF CARE: 6:51 PM Discussed ED treatment with pt     Labs Reviewed - No data to display No results found.   No diagnosis found.    MDM    Pt comes in with glue to the eye. The exam reveals most likely the glue sitting on the cornea vs. Corneal abrasion. She has eye pain, and fluorescein uptake - and the slit lamp is not working (uptake with woodslamp seen). Pt is also diabetic. We will treat as if she has a corneal abrasion. Optho f/u.     Derwood Kaplan, MD 10/29/11 1958

## 2011-10-29 NOTE — ED Notes (Signed)
Patient reports she got glue in her left eye.  She brought the product with her.  Patient is having burning pain in her eye.  She states she feels like there is something in her eye

## 2011-10-29 NOTE — ED Notes (Signed)
Morgan Lens applied to left eye and of NS started

## 2011-11-04 ENCOUNTER — Encounter: Payer: Self-pay | Admitting: Internal Medicine

## 2011-11-04 NOTE — Assessment & Plan Note (Signed)
Lab Results  Component Value Date   NA 139 09/09/2011   K 4.9 09/09/2011   CL 103 09/09/2011   CO2 27 09/09/2011   BUN 30* 09/09/2011   CREATININE 1.45* 09/09/2011   CREATININE 0.94 01/22/2010    BP Readings from Last 2 Encounters:  10/07/11 123/78  09/09/11 118/76    Assessment: Hypertension control:  controlled  Progress toward goals:  at goal Barriers to meeting goals:  no barriers identified  Plan: Hypertension treatment:  continue current medications

## 2011-11-04 NOTE — Assessment & Plan Note (Signed)
Lab Results  Component Value Date   HGBA1C 7.1 09/09/2011   HGBA1C 7.0 05/20/2011   HGBA1C 8.1 12/24/2010   Lab Results  Component Value Date   MICROALBUR 0.50 05/26/2010   LDLCALC 93 09/09/2011   CREATININE 1.45* 09/09/2011   Her A1C has been stable and right at her goal of 7%.  We will continue to monitor and encourage weight loss which will help her with even better control.

## 2011-11-04 NOTE — Assessment & Plan Note (Signed)
She has mild OSA by her Sleep study on 09/28/2011.  She will need to return for CPAP titration.  When those results are available we will prescribe based on their recommendations and reassess her in about 6 weeks to see how she is tolerating the intervention.

## 2011-11-08 ENCOUNTER — Other Ambulatory Visit: Payer: Self-pay | Admitting: Internal Medicine

## 2011-11-08 DIAGNOSIS — G4733 Obstructive sleep apnea (adult) (pediatric): Secondary | ICD-10-CM

## 2011-11-22 ENCOUNTER — Telehealth: Payer: Self-pay | Admitting: *Deleted

## 2011-11-22 NOTE — Telephone Encounter (Signed)
Talked with pt that [facility] will touch base with pt about Bipap device - pt got machine 11/21/11. Stanton Kidney Johnanthony Wilden RN 11/22/11 11:40AM

## 2011-12-02 ENCOUNTER — Ambulatory Visit (INDEPENDENT_AMBULATORY_CARE_PROVIDER_SITE_OTHER): Payer: Medicaid Other | Admitting: Internal Medicine

## 2011-12-02 ENCOUNTER — Encounter: Payer: Self-pay | Admitting: Internal Medicine

## 2011-12-02 VITALS — BP 139/81 | HR 95 | Temp 97.9°F | Wt 269.8 lb

## 2011-12-02 DIAGNOSIS — G4733 Obstructive sleep apnea (adult) (pediatric): Secondary | ICD-10-CM

## 2011-12-02 DIAGNOSIS — IMO0002 Reserved for concepts with insufficient information to code with codable children: Secondary | ICD-10-CM | POA: Insufficient documentation

## 2011-12-02 DIAGNOSIS — Z79899 Other long term (current) drug therapy: Secondary | ICD-10-CM

## 2011-12-02 DIAGNOSIS — E119 Type 2 diabetes mellitus without complications: Secondary | ICD-10-CM

## 2011-12-02 DIAGNOSIS — R110 Nausea: Secondary | ICD-10-CM

## 2011-12-02 DIAGNOSIS — I10 Essential (primary) hypertension: Secondary | ICD-10-CM

## 2011-12-02 DIAGNOSIS — W010XXA Fall on same level from slipping, tripping and stumbling without subsequent striking against object, initial encounter: Secondary | ICD-10-CM

## 2011-12-02 LAB — CBC
HCT: 31.8 % — ABNORMAL LOW (ref 36.0–46.0)
Hemoglobin: 10.5 g/dL — ABNORMAL LOW (ref 12.0–15.0)
MCH: 28.1 pg (ref 26.0–34.0)
MCHC: 33 g/dL (ref 30.0–36.0)
MCV: 85 fL (ref 78.0–100.0)
RDW: 15.1 % (ref 11.5–15.5)

## 2011-12-02 LAB — GLUCOSE, CAPILLARY: Glucose-Capillary: 88 mg/dL (ref 70–99)

## 2011-12-02 MED ORDER — GLIPIZIDE 5 MG PO TABS: 5.0000 mg | ORAL_TABLET | Freq: Two times a day (BID) | ORAL | Status: DC

## 2011-12-02 MED ORDER — BACITRACIN-POLYMYXIN B EX OINT: 1.0000 | TOPICAL_OINTMENT | Freq: Two times a day (BID) | CUTANEOUS | Status: DC

## 2011-12-02 MED ORDER — PROMETHAZINE HCL 50 MG PO TABS: 25.0000 mg | ORAL_TABLET | Freq: Four times a day (QID) | ORAL | Status: DC | PRN

## 2011-12-02 NOTE — Progress Notes (Signed)
Subjective:   Patient ID: [name] female   DOB: [birth year] 56 y.o.   MRN: [id number]  HPI: Ms. and not is 56 year old woman with past medical history significant for type 2 diabetes mellitus presents to the clinic for evaluation of a right great toe nail injury  She states that she stumbled with her bed  on Tuesday( 3 days prior to clinic visit) and injured her right great toe nail bed. At first she didn't feel anything because of her neuropathy but then yesterday she noticed some blood oozing out when she pressed on her toe nail. She states that she noticed some clear discharge while she was taking a shower. Denies any pain or redness associated with it.  She also reports having nausea and flulike symptoms for last 2-3 days. Has been having increased cough and malaise for 3 days. Denies any vomiting, fever chills or alteration in bowel habits.  She states that since she has been using her CPAP machine her energy levels are getting better.  Past Medical History  Diagnosis Date  . Normocytic anemia   . Hypertension   . Diabetes mellitus   . Foot ulcer, left   . Leg edema    Family History  Problem Relation Age of Onset  . Diabetes Mother   . Heart disease Father    History   Social History  . Marital Status: Single    Spouse Name: N/A    Number of Children: N/A  . Years of Education: N/A   Occupational History  . Not on file.   Social History Main Topics  . Smoking status: Former Smoker    Types: Cigarettes  . Smokeless tobacco: Not on file  . Alcohol Use: No  . Drug Use: No  . Sexually Active: No   Other Topics Concern  . 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  Living in a rent controlled apartment.    Review of Systems: General: Denies fever, chills, diaphoresis, appetite change and fatigue. HEENT: Denies photophobia, eye pain, redness, hearing loss, ear pain, congestion, sore throat, rhinorrhea, sneezing, mouth sores, trouble swallowing, neck pain, neck stiffness and tinnitus. Respiratory: Denies SOB, DOE, cough, chest tightness, and wheezing. Cardiovascular: Denies to chest pain, palpitations and leg swelling. Gastrointestinal: + nausea, denies vomiting, abdominal pain, diarrhea, constipation, blood in stool and abdominal distention. Genitourinary: Denies dysuria, urgency, frequency, hematuria, flank pain and difficulty urinating. Musculoskeletal: Denies myalgias, back pain, joint swelling, arthralgias and gait problem.  Skin: Denies pallor, rash and wound. Neurological: Denies dizziness, seizures, syncope, weakness, light-headedness, numbness and headaches. Hematological: Denies adenopathy, easy bruising, personal or family bleeding history. Psychiatric/Behavioral: Denies suicidal ideation, mood changes, confusion, nervousness, sleep disturbance and agitation.    Current Outpatient Medications: Current Outpatient Prescriptions  Medication Sig Dispense Refill  . Bacitracin-Polymyxin B OINT Apply 1 each topically 2 (two) times daily.  1 Tube  0  . Calcium Carbonate-Vitamin D 600-400 MG-UNIT per chew tablet Chew 1 tablet by mouth 2 (two) times daily.  30 tablet  11  . diphenhydrAMINE (BENADRYL) 25 MG tablet Take 1 tablet (25 mg total) by mouth every 6 (six) hours.  20 tablet  0  . erythromycin (ROMYCIN)  ophthalmic ointment Place into the left eye at bedtime.  3.5 g  0  . ferrous sulfate 325 (65 FE) MG tablet Take 325 mg by mouth daily. With orange juice       . furosemide (LASIX) 40 MG tablet Take 1 tablet (40 mg total) by mouth daily.  30 tablet  4  . glipiZIDE (GLUCOTROL) 5 MG tablet Take 1 tablet (5 mg total) by  mouth 2 (two) times daily before a meal.  60 tablet  1  . HYDROcodone-acetaminophen (NORCO/VICODIN) 5-325 MG per tablet Take 1 tablet by mouth every 4 (four) hours as needed for pain.  16 tablet  0  . ibuprofen (ADVIL,MOTRIN) 400 MG tablet Take 1 tablet (400 mg total) by mouth every 6 (six) hours as needed for pain.  30 tablet  0  . lisinopril (PRINIVIL,ZESTRIL) 20 MG tablet Take 1 tablet (20 mg total) by mouth daily.  30 tablet  4  . metFORMIN (GLUCOPHAGE) 1000 MG tablet Take 1 tablet (1,000 mg total) by mouth 2 (two) times daily with a meal.  60 tablet  5  . Multiple Vitamin (MULTIVITAMIN) tablet Take 1 tablet by mouth daily.       . potassium chloride SA (K-DUR,KLOR-CON) 20 MEQ tablet Take 1 tablet (20 mEq total) by mouth daily.  30 tablet  6  . promethazine (PHENERGAN) 50 MG tablet Take 0.5 tablets (25 mg total) by mouth every 6 (six) hours as needed for nausea.  30 tablet  0    Allergies: Allergies  Allergen Reactions  . Codeine     REACTION: swelling in throat and itching      Objective:   Physical Exam: Filed Vitals:   12/02/11 1412  BP: 139/81  Pulse: 95  Temp: 97.9 F (36.6 C)    General: Vital signs reviewed and noted. Well-developed, well-nourished, in no acute distress; alert, appropriate and cooperative throughout examination. Head: Normocephalic, atraumatic Lungs: Normal respiratory effort. Clear to auscultation BL without crackles or wheezes. Heart: RRR. S1 and S2 normal without gallop, murmur, or rubs. Abdomen:BS normoactive. Soft, Nondistended, non-tender.  No masses or organomegaly. Extremities: No pretibial edema., trace blood noticed on right great toe nail bed, no associated skin breakdown or discharge.     Assessment & Plan:

## 2011-12-02 NOTE — Assessment & Plan Note (Signed)
Lab Results  Component Value Date   HGBA1C 7.5 12/02/2011   HGBA1C 5.7 12/17/2009   CREATININE 1.45* 09/09/2011   CREATININE 0.94 01/22/2010   MICROALBUR 0.50 05/26/2010   MICRALBCREAT 9.6 05/26/2010   CHOL 160 09/09/2011   HDL 44 09/09/2011   TRIG 113 09/09/2011    Last eye exam and foot exam:    Component Value Date/Time   HMDIABEYEEXA normal 12/29/2009   HMDIABFOOTEX done 12/29/2009    Assessment: Diabetes control: controlled Progress toward goals: unchanged Barriers to meeting goals: no barriers identified  Plan: Diabetes treatment: Would add a low-dose glipizide to target hemoglobin A1c less than 7%. Refer to: none Instruction/counseling given: reminded to get eye exam, reminded to bring blood glucose meter & log to each visit, reminded to bring medications to each visit, discussed foot care, discussed the need for weight loss and discussed diet

## 2011-12-02 NOTE — Assessment & Plan Note (Signed)
Patient injured her first great toe nail after stumbling with her bed rail- 3 days prior to clinic visit. On exam there was trace amount of blood but no pus or even skin breakdown. Congratulated her being vigilant in taking good care of her feet being diabetic. -Bacitracin ointment. -Call if great toe gets swollen, starts oozing pus.

## 2011-12-02 NOTE — Patient Instructions (Addendum)
Please keep up with your clinic appt but return  sooner if the foot gets worse- starts oozing pus or blood . Please bring your medication bottles with your next appointment. Please take your medicines as prescribed. I will call you with your lab results if anything will be abnormal.

## 2011-12-03 LAB — COMPLETE METABOLIC PANEL WITH GFR
ALT: <8 U/L (ref 0–35)
AST: 17 U/L (ref 0–37)
Albumin: 4.3 g/dL (ref 3.5–5.2)
Alkaline Phosphatase: 46 U/L (ref 39–117)
Glucose, Bld: 108 mg/dL — ABNORMAL HIGH (ref 70–99)
Potassium: 4.8 mEq/L (ref 3.5–5.3)
Sodium: 137 mEq/L (ref 135–145)
Total Bilirubin: 0.3 mg/dL (ref 0.3–1.2)
Total Protein: 7.2 g/dL (ref 6.0–8.3)

## 2011-12-05 DIAGNOSIS — R110 Nausea: Secondary | ICD-10-CM | POA: Insufficient documentation

## 2011-12-05 NOTE — Assessment & Plan Note (Signed)
Patient has started using her CPAP machine and reports significant improvement in her energy levels during the day. -Encouraged her to keep wearing the CPAP machine at night.

## 2011-12-05 NOTE — Assessment & Plan Note (Addendum)
B. nausea for 2-3 days along with flulike symptoms including malaise and some dry cough. Etiology for nausea is not clear but could be a viral illness gastroparesis versus ulcer disease. Peptic ulcer disease seems less likely in the absence of any epigastric pain or symptoms of burning pain. I think this is most likely a viral illness. -Symptomatic treatment with Phenergan. -Call if symptoms fail to improve in a week. - Check CBC, CMET  Update: Her labs are unremarkable.

## 2011-12-05 NOTE — Assessment & Plan Note (Signed)
Lab Results  Component Value Date   NA 137 12/02/2011   K 4.8 12/02/2011   CL 100 12/02/2011   CO2 28 12/02/2011   BUN 25* 12/02/2011   CREATININE 1.42* 12/02/2011   CREATININE 0.94 01/22/2010    BP Readings from Last 3 Encounters:  12/02/11 139/81  10/29/11 147/80  10/07/11 123/78    Assessment: Hypertension control:  controlled  Progress toward goals:  improved Barriers to meeting goals:  no barriers identified  Plan: Hypertension treatment:  continue current medications

## 2011-12-21 IMAGING — CR DG NECK SOFT TISSUE
1 series · 1 of 1 positions shown · non-contrast
Comparison: None.

CLINICAL DATA: Sore throat.  Hemoptysis.  Difficulty swallowing.
Hypertension.

NECK SOFT TISSUES - 1+ VIEW

[w soft tissue neck]
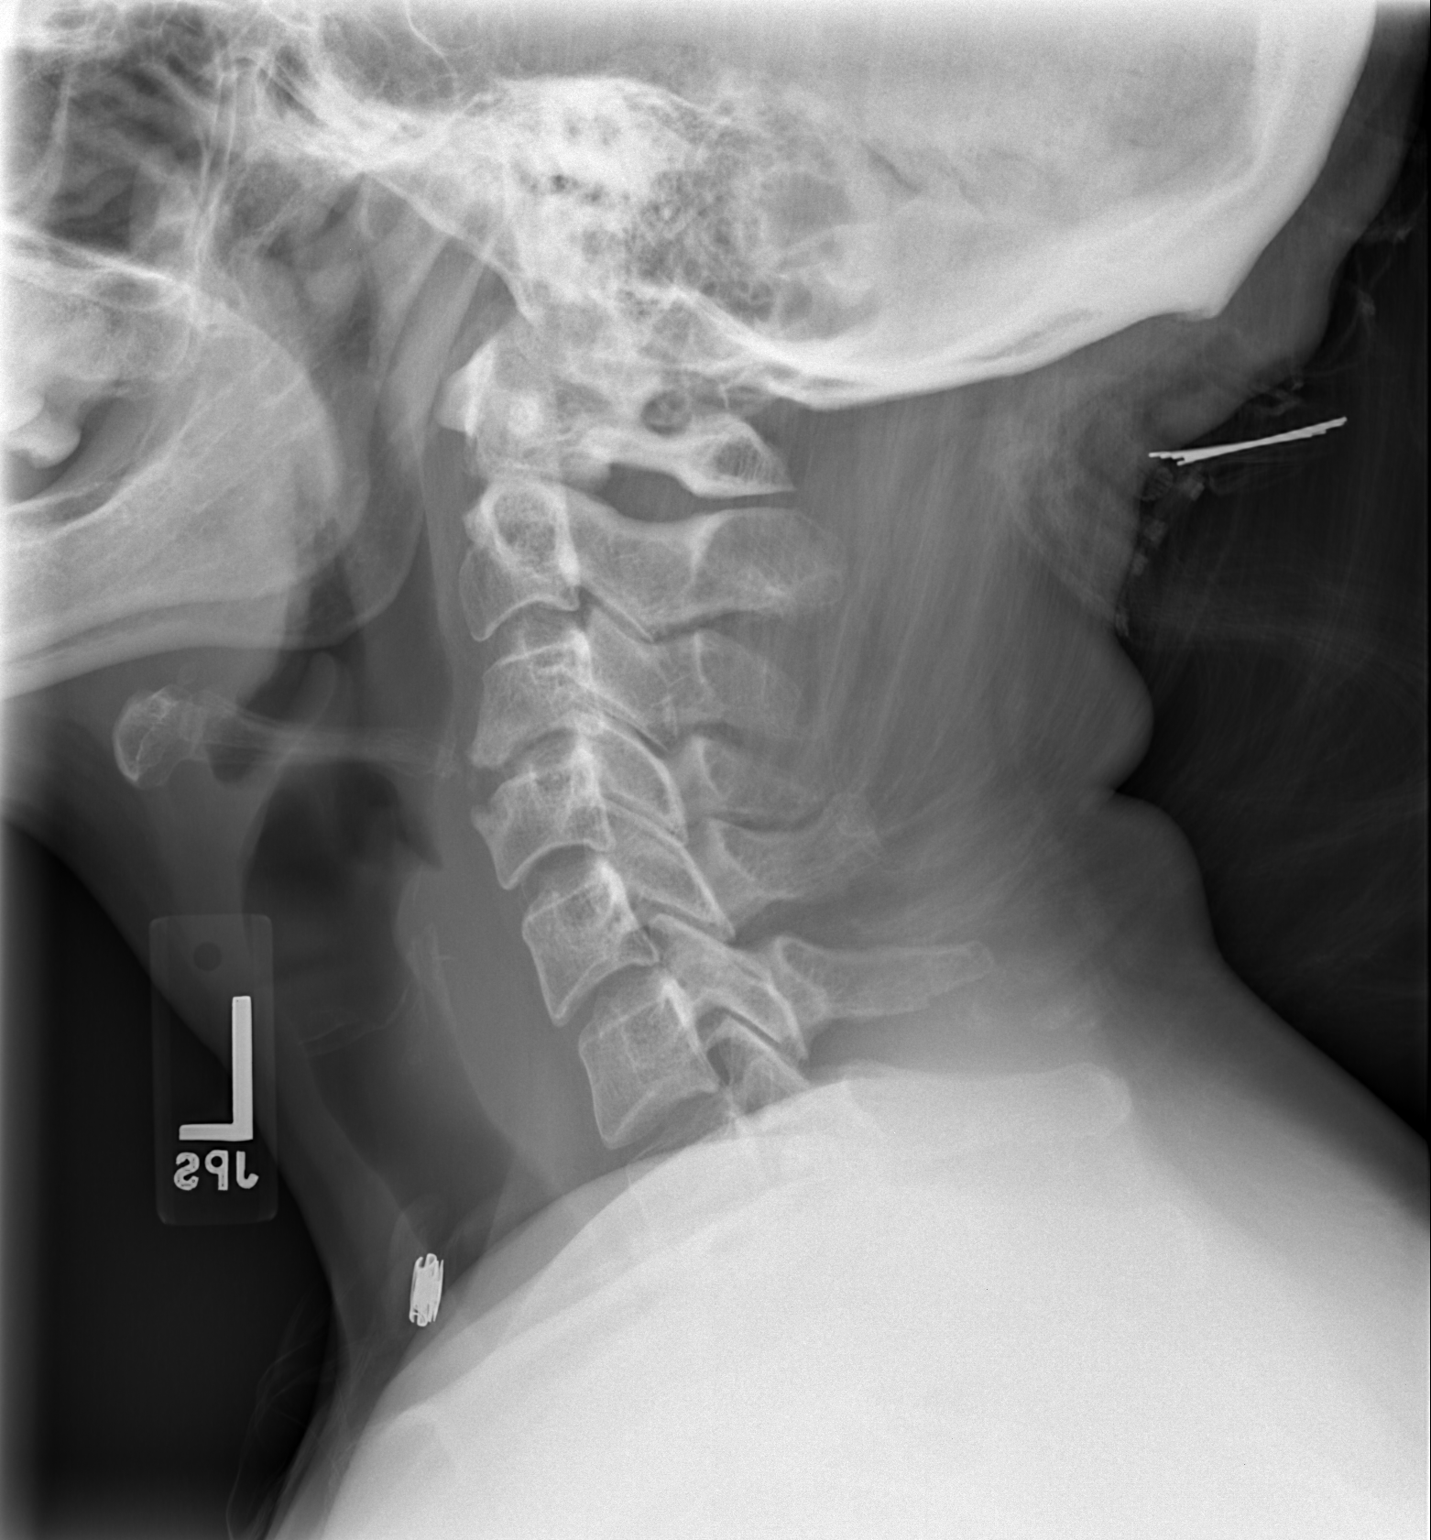

[1 of 1 positions shown; findings below may reference images not displayed]

FINDINGS: Single lateral soft tissue view of the neck.
Straightening expected cervical lordosis.  Trace C3-4
retrolisthesis.

Prevertebral soft tissues are within normal limits.  The airway is
widely patent.  The epiglottis and A - E folds are within normal
limits.
IMPRESSION: 1.  No evidence of prevertebral soft tissue swelling or airway
compromise.
2.  Trace C3-C4 retrolisthesis, most likely degenerative.

## 2011-12-23 ENCOUNTER — Ambulatory Visit (INDEPENDENT_AMBULATORY_CARE_PROVIDER_SITE_OTHER): Payer: Medicaid Other | Admitting: Internal Medicine

## 2011-12-23 ENCOUNTER — Encounter: Payer: Self-pay | Admitting: Internal Medicine

## 2011-12-23 VITALS — BP 113/74 | HR 96 | Temp 98.2°F | Resp 20 | Ht 69.5 in | Wt 274.4 lb

## 2011-12-23 DIAGNOSIS — E119 Type 2 diabetes mellitus without complications: Secondary | ICD-10-CM

## 2011-12-23 DIAGNOSIS — G4733 Obstructive sleep apnea (adult) (pediatric): Secondary | ICD-10-CM

## 2011-12-23 DIAGNOSIS — I10 Essential (primary) hypertension: Secondary | ICD-10-CM

## 2011-12-23 MED ORDER — PRAVASTATIN SODIUM 20 MG PO TABS: 20.0000 mg | ORAL_TABLET | Freq: Every day | ORAL | Status: DC

## 2011-12-23 MED ORDER — ASPIRIN EC 81 MG PO TBEC: 81.0000 mg | DELAYED_RELEASE_TABLET | Freq: Every day | ORAL | Status: DC

## 2011-12-23 NOTE — Patient Instructions (Signed)
1.  Start Pravastatin 20 mg tablets.  Take 1 tablet daily for your heart and cholesterol.  2.  Continue your other medications as prescribed.  3.  Keep an eye on your blood sugars.  If you feel the shakiness and dizziness check your sugars.  If you start to have several lows every week please call me right away.  4.  Follow up with me in February.

## 2011-12-23 NOTE — Progress Notes (Signed)
Subjective:   Patient ID: [name] female   DOB: Aug 21, 1955 56 y.o.   MRN: [id number]  HPI: [name] is a 56 y.o. woman with a PMH of diabetes, hypertension, and obstructive sleep apnea who presents to clinic for follow up on her chronic medical problems.  See Problem focused Assessment and Plan for full details of her chronic medical conditions.   Past Medical History  Diagnosis Date  . Normocytic anemia   . Hypertension   . Diabetes mellitus   . Foot ulcer, left   . Leg edema    Current Outpatient Prescriptions  Medication Sig Dispense Refill  . Bacitracin-Polymyxin B OINT Apply 1 each topically 2 (two) times daily.  1 Tube  0  . Calcium Carbonate-Vitamin D 600-400 MG-UNIT per chew tablet Chew 1 tablet by mouth 2 (two) times daily.  30 tablet  11  . diphenhydrAMINE (BENADRYL) 25 MG tablet Take 1 tablet (25 mg total) by mouth every 6 (six) hours.  20 tablet  0  . erythromycin (ROMYCIN) ophthalmic ointment Place into the left eye at bedtime.  3.5 g  0  . ferrous sulfate 325 (65 FE) MG tablet Take 325 mg by mouth daily. With orange juice       . furosemide (LASIX) 40 MG tablet Take 1 tablet (40 mg total) by mouth daily.  30 tablet  4  . glipiZIDE (GLUCOTROL) 5 MG tablet Take 1 tablet (5 mg total) by mouth 2 (two) times daily before a meal.  60 tablet  1  . HYDROcodone-acetaminophen (NORCO/VICODIN) 5-325 MG per tablet Take 1 tablet by mouth every 4 (four) hours as needed for pain.  16 tablet  0  . ibuprofen (ADVIL,MOTRIN) 400 MG tablet Take 1 tablet (400 mg total) by mouth every 6 (six) hours as needed for pain.  30 tablet  0  . lisinopril (PRINIVIL,ZESTRIL) 20 MG tablet Take 1 tablet (20 mg total) by mouth daily.  30 tablet  4  . metFORMIN (GLUCOPHAGE) 1000 MG tablet Take 1 tablet (1,000 mg total) by mouth 2 (two) times daily with a meal.  60 tablet  5  . Multiple Vitamin (MULTIVITAMIN) tablet Take 1 tablet by mouth daily.       . potassium chloride SA (K-DUR,KLOR-CON)  20 MEQ tablet Take 1 tablet (20 mEq total) by mouth daily.  30 tablet  6  . promethazine (PHENERGAN) 50 MG tablet Take 0.5 tablets (25 mg total) by mouth every 6 (six) hours as needed for nausea.  30 tablet  0   Family History  Problem Relation Age of Onset  . Diabetes Mother   . Heart disease Father    History   Social History  . Marital Status: Single    Spouse Name: N/A    Number of Children: N/A  . Years of Education: N/A   Social History Main Topics  . Smoking status: Former Smoker    Types: Cigarettes  . Smokeless tobacco: Not on file  . Alcohol Use: No  . Drug Use: No  . Sexually Active: No   Other Topics Concern  . 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  Living in a rent controlled apartment.  Review of Systems: A full 12 system ROS is negative except as noted in the HPI and A&P.   Objective:  Physical Exam: Filed Vitals:   12/23/11 1502  BP: 113/74  Pulse: 96  Temp: 98.2 F (36.8 C)  TempSrc: Oral  Resp: 20  Height: 5' 9.5" (1.765 m)  Weight: 274 lb 6.4 oz (124.467 kg)  SpO2: 99%   Constitutional: Vital signs reviewed.  Patient is a well-developed and well-nourished morbidly obese woman in no acute distress and cooperative with exam. Alert and oriented x3.  Head: Normocephalic and atraumatic Ear: TM normal bilaterally Mouth: no erythema or exudates, MMM Eyes: PERRL, EOMI, conjunctivae normal, No scleral icterus.  Neck: Supple, Trachea midline normal ROM, No JVD, mass, thyromegaly, or carotid bruit present.  Cardiovascular: RRR, S1 normal, S2 normal, no MRG, pulses symmetric and intact bilaterally Pulmonary/Chest: CTAB, no  wheezes, rales, or rhonchi Abdominal: Soft. Non-tender, non-distended, bowel sounds are normal, no masses, organomegaly, or guarding present.  GU: no CVA tenderness Musculoskeletal: No joint deformities, erythema, or stiffness, ROM full and no nontender Hematology: no cervical, inginal, or axillary adenopathy.  Neurological: A&O x3, Strength is normal and symmetric bilaterally, cranial nerve II-XII are grossly intact, no focal motor deficit, sensory intact to light touch bilaterally.  Skin: Warm, dry and intact. No rash, cyanosis, or clubbing.  Psychiatric: Normal mood and affect. speech and behavior is normal. Judgment and thought content normal. Cognition and memory are normal.   Assessment & Plan:

## 2012-01-06 ENCOUNTER — Other Ambulatory Visit: Payer: Self-pay | Admitting: Internal Medicine

## 2012-01-18 IMAGING — CT CT ABD-PELV W/ CM
2 of 5 series · 16 of 46 positions shown, 18 images · IV contrast (READICAT & 100ml omni 300)
Comparison: None.

CLINICAL DATA: Worsening left lower extremity swelling and pain.
Evaluate for obstructing mass or lymphadenopathy.

CT ABDOMEN AND PELVIS WITH CONTRAST
TECHNIQUE: Multidetector CT imaging of the abdomen and pelvis was
performed following the standard protocol during bolus
administration of intravenous contrast.
Contrast: 100 ml Nmnipaque-JFF and oral contrast

[Series 2: routine abdomen · axial · 0.86mm/px · z∈[-417,-37]mm · 13 of 90 slices shown, 15 images]
[im 7/90  soft-tissue]
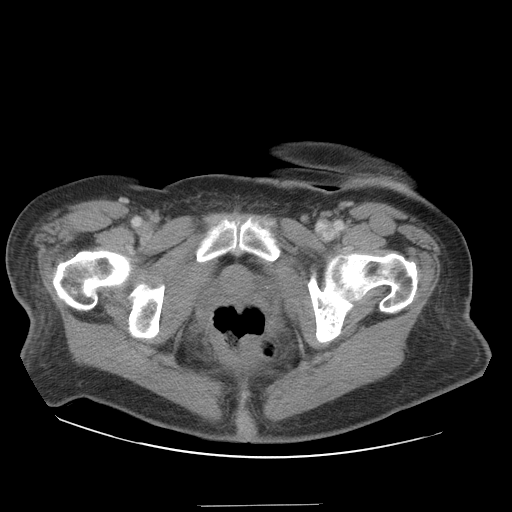
[im 7/90  bone]
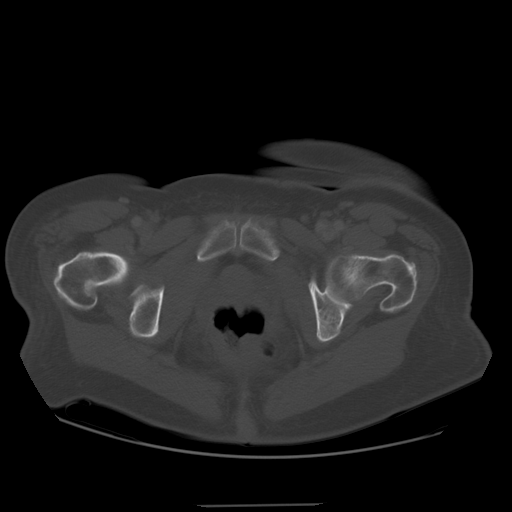
[im 13/90  soft-tissue]
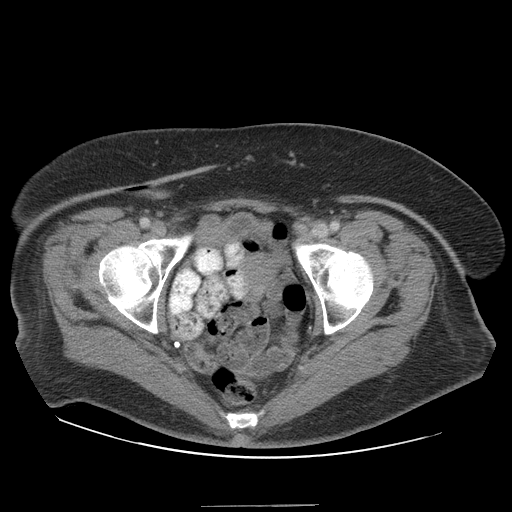
[im 20/90  soft-tissue]
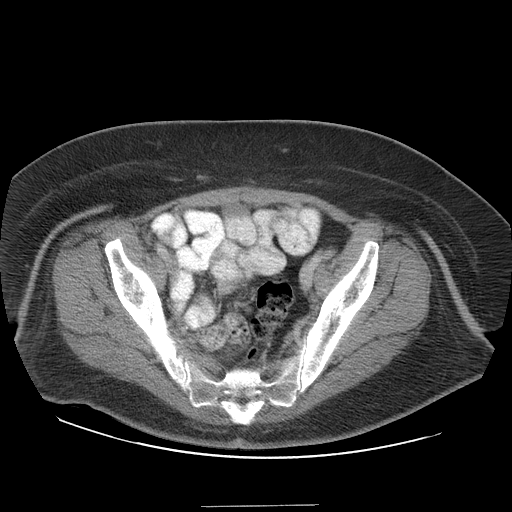
[im 26/90  soft-tissue]
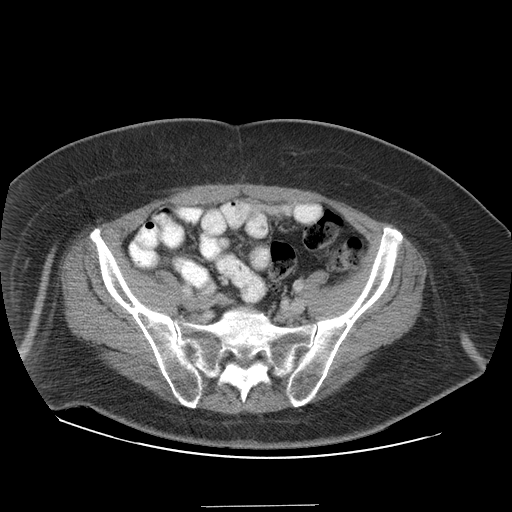
[im 32/90  soft-tissue]
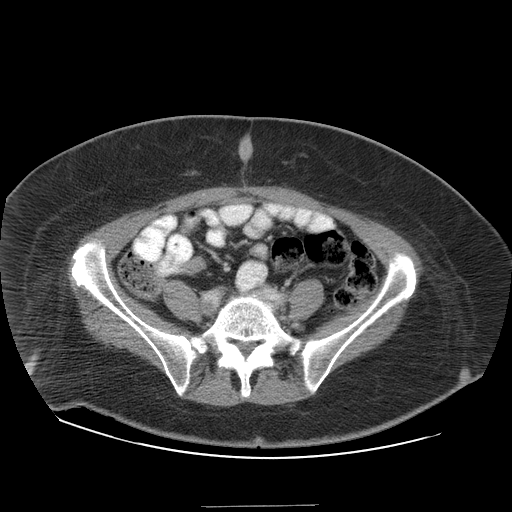
[im 39/90  soft-tissue]
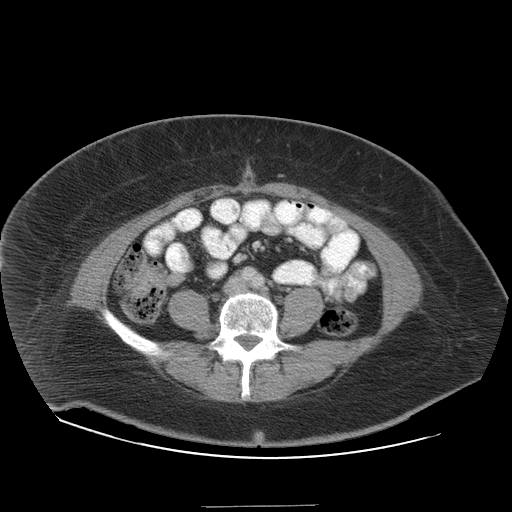
[im 45/90  soft-tissue]
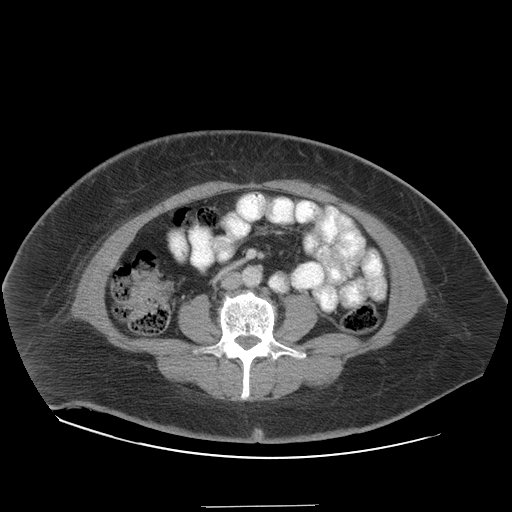
[im 51/90  soft-tissue]
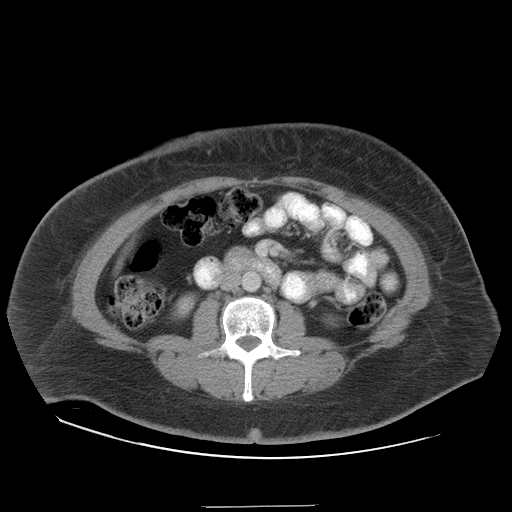
[im 58/90  soft-tissue]
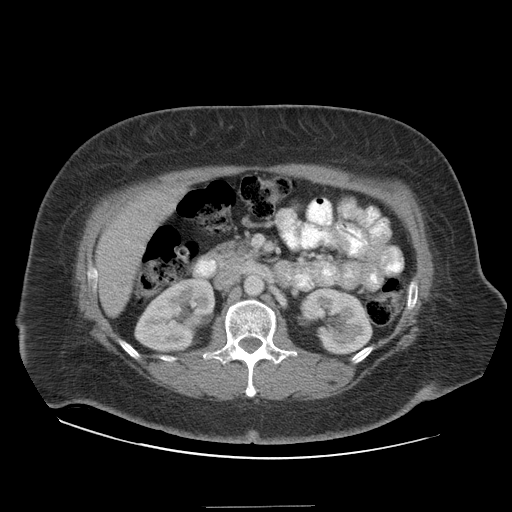
[im 58/90  bone]
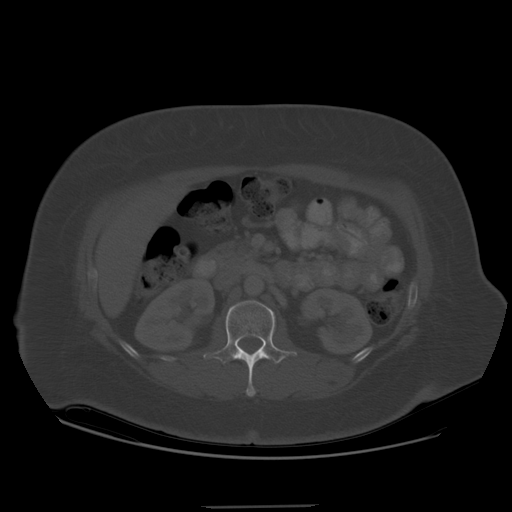
[im 64/90  soft-tissue]
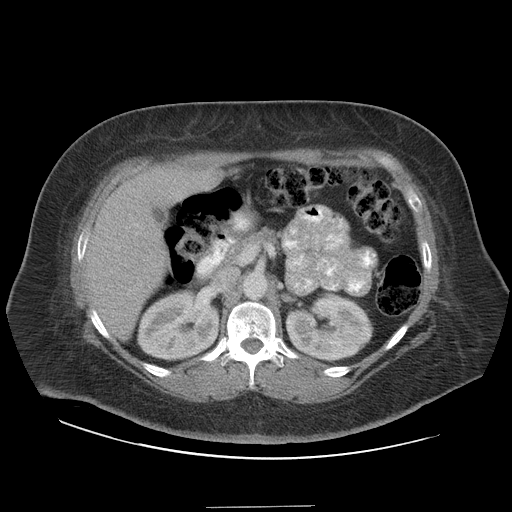
[im 70/90  soft-tissue]
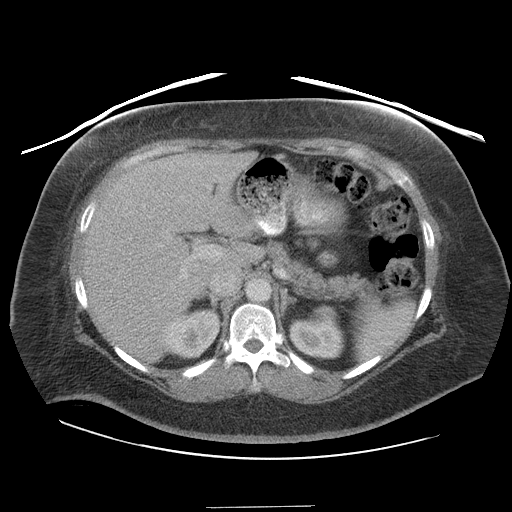
[im 77/90  soft-tissue]
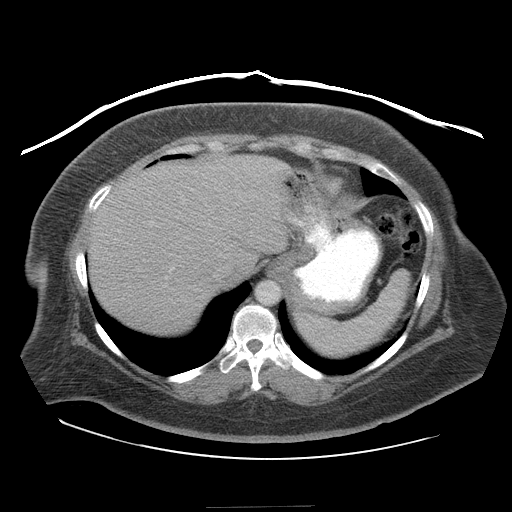
[im 83/90  soft-tissue]
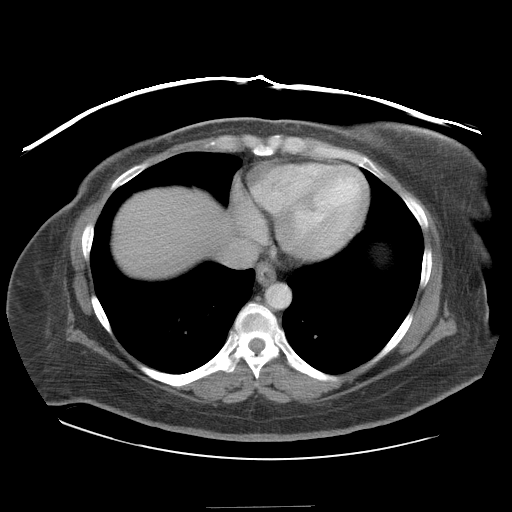

[Series 401: cor · coronal · 0.96mm/px · 3 of 91 slices shown]
[im 31/91  soft-tissue]
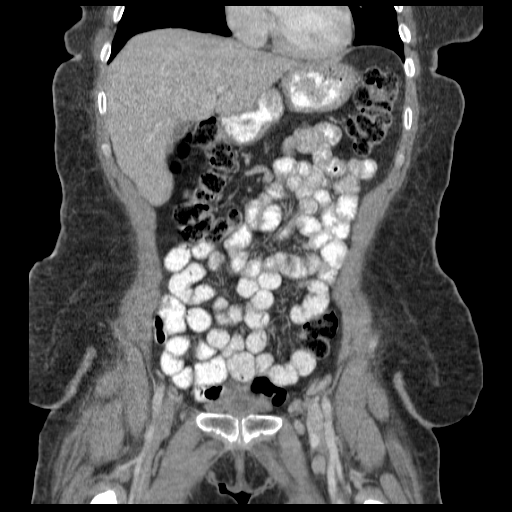
[im 41/91  soft-tissue]
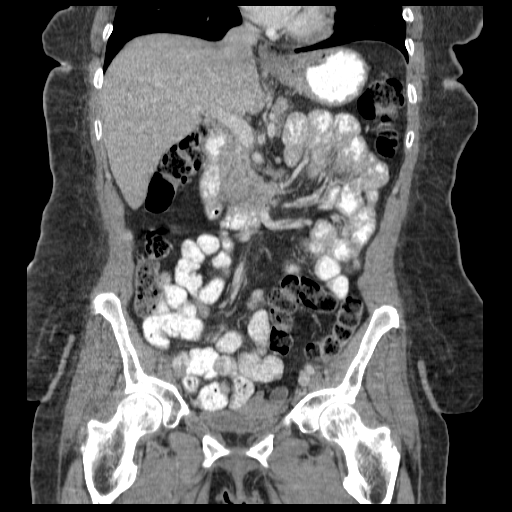
[im 51/91  soft-tissue]
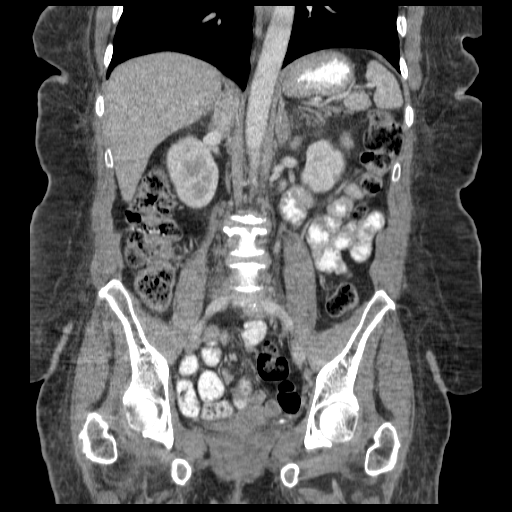

[16 of 46 positions shown; findings below may reference images not displayed]

FINDINGS: The abdominal parenchymal organs are normal in
appearance.  Gallbladder is unremarkable.  There is no evidence of
hydronephrosis.

No soft tissue masses are identified within the abdomen or pelvis.
A left  femoral lymph node is seen in the proximal thigh measuring
17 mm in short axis diameter.  No other pathologically enlarged
nodes are seen in the inguinal regions or elsewhere within the
abdomen or pelvis.

There is no evidence of inflammatory process or abnormal fluid
collections.  Previous hysterectomy noted.  No evidence of bowel
wall thickening or dilatation.
IMPRESSION: 1.  17 mm left femoral lymph node in the proximal thigh, which is
of uncertain etiology and significance.
2.  No evidence of obstructing mass or other lymphadenopathy.

## 2012-02-01 ENCOUNTER — Other Ambulatory Visit: Payer: Self-pay | Admitting: Internal Medicine

## 2012-02-04 ENCOUNTER — Other Ambulatory Visit: Payer: Self-pay | Admitting: Internal Medicine

## 2012-02-11 ENCOUNTER — Encounter (HOSPITAL_COMMUNITY): Payer: Self-pay | Admitting: Emergency Medicine

## 2012-02-11 ENCOUNTER — Emergency Department (HOSPITAL_COMMUNITY)
Admission: EM | Admit: 2012-02-11 | Discharge: 2012-02-11 | Disposition: A | Discharge status: Home / Self Care | Payer: Medicaid Other | Attending: Emergency Medicine | Admitting: Emergency Medicine

## 2012-02-11 DIAGNOSIS — R109 Unspecified abdominal pain: Secondary | ICD-10-CM | POA: Insufficient documentation

## 2012-02-11 DIAGNOSIS — E119 Type 2 diabetes mellitus without complications: Secondary | ICD-10-CM | POA: Insufficient documentation

## 2012-02-11 DIAGNOSIS — Z87891 Personal history of nicotine dependence: Secondary | ICD-10-CM | POA: Insufficient documentation

## 2012-02-11 DIAGNOSIS — Z7982 Long term (current) use of aspirin: Secondary | ICD-10-CM | POA: Insufficient documentation

## 2012-02-11 DIAGNOSIS — I10 Essential (primary) hypertension: Secondary | ICD-10-CM | POA: Insufficient documentation

## 2012-02-11 DIAGNOSIS — Z8739 Personal history of other diseases of the musculoskeletal system and connective tissue: Secondary | ICD-10-CM | POA: Insufficient documentation

## 2012-02-11 DIAGNOSIS — R509 Fever, unspecified: Secondary | ICD-10-CM | POA: Insufficient documentation

## 2012-02-11 DIAGNOSIS — N12 Tubulo-interstitial nephritis, not specified as acute or chronic: Secondary | ICD-10-CM | POA: Insufficient documentation

## 2012-02-11 DIAGNOSIS — Z79899 Other long term (current) drug therapy: Secondary | ICD-10-CM | POA: Insufficient documentation

## 2012-02-11 DIAGNOSIS — Z862 Personal history of diseases of the blood and blood-forming organs and certain disorders involving the immune mechanism: Secondary | ICD-10-CM | POA: Insufficient documentation

## 2012-02-11 DIAGNOSIS — R3915 Urgency of urination: Secondary | ICD-10-CM | POA: Insufficient documentation

## 2012-02-11 DIAGNOSIS — Z872 Personal history of diseases of the skin and subcutaneous tissue: Secondary | ICD-10-CM | POA: Insufficient documentation

## 2012-02-11 DIAGNOSIS — R350 Frequency of micturition: Secondary | ICD-10-CM | POA: Insufficient documentation

## 2012-02-11 DIAGNOSIS — R112 Nausea with vomiting, unspecified: Secondary | ICD-10-CM | POA: Insufficient documentation

## 2012-02-11 LAB — URINALYSIS, ROUTINE W REFLEX MICROSCOPIC
Bilirubin Urine: NEGATIVE
Glucose, UA: NEGATIVE mg/dL
Hgb urine dipstick: NEGATIVE
Ketones, ur: NEGATIVE mg/dL
Nitrite: NEGATIVE
Protein, ur: NEGATIVE mg/dL
Specific Gravity, Urine: 1.01 (ref 1.005–1.030)
Urobilinogen, UA: 0.2 mg/dL (ref 0.0–1.0)
pH: 6 (ref 5.0–8.0)

## 2012-02-11 LAB — URINE MICROSCOPIC-ADD ON

## 2012-02-11 MED ORDER — ONDANSETRON 4 MG PO TBDP: 4.0000 mg | ORAL_TABLET | Freq: Three times a day (TID) | ORAL | Status: DC | PRN

## 2012-02-11 MED ORDER — DEXTROSE 5 % IV SOLN
1.0000 g | Freq: Once | INTRAVENOUS | Status: AC
  Administered 2012-02-11: 1 g via INTRAVENOUS
  Filled 2012-02-11: qty 10

## 2012-02-11 MED ORDER — CIPROFLOXACIN HCL 500 MG PO TABS: 500.0000 mg | ORAL_TABLET | Freq: Two times a day (BID) | ORAL | Status: DC

## 2012-02-11 NOTE — ED Notes (Signed)
Pt. Stated, I've been having lower back pain since yesterday and I think it might be my kidneys.Last night and this morning my urine smell has been stronger.

## 2012-02-11 NOTE — ED Provider Notes (Signed)
Medical screening examination/treatment/procedure(s) were performed by non-physician practitioner and as supervising physician I was immediately available for consultation/collaboration.  Jaeden Westbay, MD 02/11/12 1713 

## 2012-02-11 NOTE — ED Notes (Signed)
Pt c/o right lower back pain since yesterday. She felt like her urine smelled strong. Pt is diabetic.

## 2012-02-11 NOTE — ED Notes (Signed)
IV attempt x2 without success.

## 2012-02-11 NOTE — ED Provider Notes (Signed)
History     CSN: [id number]  Arrival date & time 02/11/12  1104   First MD Initiated Contact with Patient 02/11/12 1124      Chief Complaint  Patient presents with  . Back Pain    (Consider location/radiation/quality/duration/timing/severity/associated sxs/prior treatment) HPI Comments: Patient presents today with a chief complaint of right flank pain.  She reports that the pain has been present since last evening.  The pain has been associated with increased urinary frequency,  strong odor to her urine, and urinary urgency.  She denies dysuria or hematuria.  She reports that she did have a fever of 100 orally last evening.  She also had one episode of vomiting yesterday.  She has not had any vomiting today and has been able to tolerate PO liquids.  She took Tylenol for the pain last evening, but has not had anything for pain or fever since that time.  She reports that her symptoms feel similar to when she was diagnosed with a kidney infection in the past.  No prior history of kidney stones.  Patient is a 57 y.o. female presenting with flank pain. The history is provided by the patient.  Flank Pain This is a new problem. The current episode started yesterday. The problem occurs constantly. The problem has been gradually worsening. Associated symptoms include chills, a fever, nausea and vomiting. Pertinent negatives include no abdominal pain. Nothing aggravates the symptoms.    Past Medical History  Diagnosis Date  . Normocytic anemia   . Hypertension   . Diabetes mellitus   . Foot ulcer, left   . Leg edema     Past Surgical History  Procedure Date  . Oophrectomy 1975  . Appendectomy     Family History  Problem Relation Age of Onset  . Diabetes Mother   . Heart disease Father     History  Substance Use Topics  . Smoking status: Former Smoker    Types: Cigarettes    Quit date: 12/22/1972  . Smokeless tobacco: Not on file  . Alcohol Use: No    OB History    Grav Para  Term Preterm Abortions TAB SAB Ect Mult Living                  Review of Systems  Constitutional: Positive for fever and chills.  Gastrointestinal: Positive for nausea and vomiting. Negative for abdominal pain, diarrhea, constipation and blood in stool.  Genitourinary: Positive for urgency, frequency and flank pain. Negative for hematuria, decreased urine volume, vaginal bleeding, vaginal discharge and vaginal pain.  All other systems reviewed and are negative.    Allergies  Codeine  Home Medications   Current Outpatient Rx  Name  Route  Sig  Dispense  Refill  . ACETAMINOPHEN 500 MG PO TABS   Oral   Take 1,000 mg by mouth every 6 (six) hours as needed. For back pain.         . ASPIRIN EC 81 MG PO TBEC   Oral   Take 81 mg by mouth daily.         Marland Kitchen FERROUS SULFATE 325 (65 FE) MG PO TABS   Oral   Take 325 mg by mouth daily. With orange juice         . FUROSEMIDE 40 MG PO TABS   Oral   Take 1 tablet (40 mg total) by mouth daily.   30 tablet   4   . GLIPIZIDE 5 MG PO TABS   Oral  Take 5 mg by mouth 2 (two) times daily before a meal.         . GLUCOSE BLOOD VI STRP   Other   1 each by Other route as needed for other (3 times per day).   100 strip   11     CYCLE FILL MEDICATION. Authorization is required f ...   . LISINOPRIL 20 MG PO TABS   Oral   Take 1 tablet (20 mg total) by mouth daily.   30 tablet   4   . METFORMIN HCL 1000 MG PO TABS   Oral   Take 1 tablet (1,000 mg total) by mouth 2 (two) times daily with a meal.   60 tablet   5   . ONE-DAILY MULTI VITAMINS PO TABS   Oral   Take 1 tablet by mouth daily.          Marland Kitchen POTASSIUM CHLORIDE CRYS ER 20 MEQ PO TBCR   Oral   Take 20 mEq by mouth daily.         Marland Kitchen PROMETHAZINE HCL 50 MG PO TABS   Oral   Take 25 mg by mouth every 6 (six) hours as needed. For nausea.         Marland Kitchen RANITIDINE HCL 150 MG PO TABS   Oral   Take 150 mg by mouth daily.         Marland Kitchen CALCIUM CARBONATE-VITAMIN D  600-400 MG-UNIT PO CHEW   Oral   Chew 1 tablet by mouth 2 (two) times daily.   30 tablet   11     BP 139/88  Pulse 90  Temp 97.9 F (36.6 C) (Oral)  Resp 18  SpO2 99%  Physical Exam  Nursing note and vitals reviewed. Constitutional: She appears well-developed and well-nourished. No distress.  HENT:  Head: Normocephalic and atraumatic.  Mouth/Throat: Oropharynx is clear and moist.  Neck: Normal range of motion. Neck supple.  Cardiovascular: Normal rate, regular rhythm and normal heart sounds.   Pulmonary/Chest: Effort normal and breath sounds normal. No respiratory distress. She has no wheezes. She has no rales.  Abdominal: Soft. Bowel sounds are normal. She exhibits no distension and no mass. There is no tenderness. There is no rebound and no guarding.       Right CVA tenderness  Musculoskeletal: Normal range of motion.  Neurological: She is alert.  Skin: Skin is warm and dry. She is not diaphoretic.  Psychiatric: She has a normal mood and affect.    ED Course  Procedures (including critical care time)  Labs Reviewed  URINALYSIS, ROUTINE W REFLEX MICROSCOPIC - Abnormal; Notable for the following:    APPearance HAZY (*)     Leukocytes, UA LARGE (*)     All other components within normal limits  URINE MICROSCOPIC-ADD ON - Abnormal; Notable for the following:    Squamous Epithelial / LPF MANY (*)     Bacteria, UA FEW (*)     All other components within normal limits  URINE CULTURE   No results found.   No diagnosis found.  Patient given dose of IV Ceftriaxone while in the ED.    MDM  Patient presenting with flank pain and urinary symptoms.  UA demonstrating UTI.  Suspect early Pyelonephritis.  Patient afebrile in the ED and able to tolerate PO liquids.  Patient discharged home with prescription for Cipro and instructed to follow up with PCP.  Return precautions given.  Pascal Lux [place], PA-C 02/11/12 1539

## 2012-02-12 LAB — URINE CULTURE: Colony Count: 65000

## 2012-02-20 ENCOUNTER — Encounter: Payer: Self-pay | Admitting: Internal Medicine

## 2012-02-20 ENCOUNTER — Ambulatory Visit (INDEPENDENT_AMBULATORY_CARE_PROVIDER_SITE_OTHER): Payer: Medicaid Other | Admitting: Internal Medicine

## 2012-02-20 VITALS — BP 137/82 | HR 85 | Temp 98.4°F | Resp 20 | Ht 69.5 in | Wt 284.0 lb

## 2012-02-20 DIAGNOSIS — M549 Dorsalgia, unspecified: Secondary | ICD-10-CM | POA: Insufficient documentation

## 2012-02-20 DIAGNOSIS — I10 Essential (primary) hypertension: Secondary | ICD-10-CM

## 2012-02-20 DIAGNOSIS — E119 Type 2 diabetes mellitus without complications: Secondary | ICD-10-CM

## 2012-02-20 DIAGNOSIS — Z79899 Other long term (current) drug therapy: Secondary | ICD-10-CM

## 2012-02-20 DIAGNOSIS — R110 Nausea: Secondary | ICD-10-CM

## 2012-02-20 LAB — GLUCOSE, CAPILLARY: Glucose-Capillary: 145 mg/dL — ABNORMAL HIGH (ref 70–99)

## 2012-02-20 MED ORDER — ACETAMINOPHEN 325 MG PO TABS: 325.0000 mg | ORAL_TABLET | Freq: Four times a day (QID) | ORAL | Status: DC | PRN

## 2012-02-20 MED ORDER — DICLOFENAC SODIUM 1 % TD GEL: 2.0000 g | Freq: Four times a day (QID) | TRANSDERMAL | Status: DC | PRN

## 2012-02-20 NOTE — Assessment & Plan Note (Addendum)
Likely sciatica in nature. No warning signs notable including weakness, bladder of bowel habits, fever or chills. Highly unlikely pyelonephritis as ED noted. Urine culture just showed 65.000 colonies of multiple bacterial morphotypes. Completed antibiotic therapy. No urinary symptoms present.  Provided information about exercise. Diclofenac gel given.

## 2012-02-20 NOTE — Assessment & Plan Note (Signed)
Episodes of symptomatic Hypoglycemia with CBG as low as 63. Considering her Hgb A1c of 6.5 I will d/c glipizide. Recommended to monitor her CBG and discussed diet

## 2012-02-20 NOTE — Assessment & Plan Note (Signed)
Will address during next visit .

## 2012-02-20 NOTE — Assessment & Plan Note (Addendum)
BP well controled. Continue current regimen with Lisinopril 20, Lasix 40 mg   BP Readings from Last 3 Encounters:  02/20/12 137/82  02/11/12 139/88  12/23/11 113/74

## 2012-02-20 NOTE — Patient Instructions (Addendum)
No ibuprofen or aleve for pain. It is bad for your kidneys \ Stop Glipizide

## 2012-02-20 NOTE — Progress Notes (Signed)
Subjective:   Patient ID: [name] female   DOB: [birth year] 57 y.o.   MRN: [id number]  HPI: [name] is a 57 y.o.   Back pain: right side , pain moves down the leg , it started last week. Denies heavy weights. Patient was seen 2 weeks ago in the ED for UTI ( Urine culture was notable for only 65.000 colonies of  multiple bacterial morphotypes  ) . She completed a 7 day course of antibiotics ( Cipro) . Denies today any urinary symptoms. Denies any fevers or chills.   Denies any leg weakness, decreased sensation.   Weigh gain: no significant swelling in her LE.  Has been not walking to much.    DM : lowest 63 . Patient reports about Hypoglycemic episodes and does not eat regular.   Past Medical History  Diagnosis Date  . Normocytic anemia   . Hypertension   . Diabetes mellitus   . Foot ulcer, left   . Leg edema    Current Outpatient Prescriptions  Medication Sig Dispense Refill  . acetaminophen (TYLENOL) 500 MG tablet Take 1,000 mg by mouth every 6 (six) hours as needed. For back pain.      Marland Kitchen aspirin EC 81 MG tablet Take 81 mg by mouth daily.      . Calcium Carbonate-Vitamin D 600-400 MG-UNIT per chew tablet Chew 1 tablet by mouth 2 (two) times daily.  30 tablet  11  . ciprofloxacin (CIPRO) 500 MG tablet Take 1 tablet (500 mg total) by mouth 2 (two) times daily.  14 tablet  0  . ferrous sulfate 325 (65 FE) MG tablet Take 325 mg by mouth daily. With orange juice      . furosemide (LASIX) 40 MG tablet Take 1 tablet (40 mg total) by mouth daily.  30 tablet  4  . glipiZIDE (GLUCOTROL) 5 MG tablet Take 5 mg by mouth 2 (two) times daily before a meal.      . glucose blood (ACCU-CHEK AVIVA PLUS) test strip 1 each by Other route as needed for other (3 times per day).  100 strip  11  . lisinopril (PRINIVIL,ZESTRIL) 20 MG tablet Take 1 tablet (20 mg total) by mouth daily.  30 tablet  4  . metFORMIN (GLUCOPHAGE) 1000 MG tablet Take 1 tablet (1,000 mg total) by mouth 2 (two)  times daily with a meal.  60 tablet  5  . Multiple Vitamin (MULTIVITAMIN) tablet Take 1 tablet by mouth daily.       . ondansetron (ZOFRAN ODT) 4 MG disintegrating tablet Take 1 tablet (4 mg total) by mouth every 8 (eight) hours as needed for nausea.  20 tablet  0  . potassium chloride SA (K-DUR,KLOR-CON) 20 MEQ tablet Take 20 mEq by mouth daily.      . promethazine (PHENERGAN) 50 MG tablet Take 25 mg by mouth every 6 (six) hours as needed. For nausea.      . ranitidine (ZANTAC) 150 MG tablet Take 150 mg by mouth daily.       Family History  Problem Relation Age of Onset  . Diabetes Mother   . Heart disease Father    History   Social History  . Marital Status: Single    Spouse Name: N/A    Number of Children: N/A  . Years of Education: N/A   Social History Main Topics  . Smoking status: Former Smoker    Types: Cigarettes    Quit date: 12/22/1972  . Smokeless  tobacco: None  . Alcohol Use: No  . Drug Use: No  . Sexually Active: No   Other Topics Concern  . 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  Living in a rent controlled apartment.    Review of Systems: Constitutional: Denies fever, chills, diaphoresis, appetite change and fatigue.    Respiratory: Denies SOB, DOE, cough, chest tightness,  and wheezing.   Cardiovascular: Denies chest pain, palpitations and leg swelling.  Gastrointestinal: Noted  nausea, vomiting but denies abdominal pain, diarrhea, constipation, blood in stool and abdominal distention.  Genitourinary: Denies dysuria, urgency, frequency, hematuria, flank pain and difficulty urinating.  Musculoskeletal: Noted  myalgias, back pain, joint  swelling, arthralgias but denies gait problem.  Skin: Denies pallor, rash and wound.  Neurological: Denies dizziness,yncope, weakness, light-headedness, numbness and headaches.    Objective:  Physical Exam: Filed Vitals:   02/20/12 1115  BP: 137/82  Pulse: 85  Temp: 98.4 F (36.9 C)  TempSrc: Oral  Resp: 20  Height: 5' 9.5" (1.765 m)  Weight: 284 lb (128.822 kg)  SpO2: 98%   Constitutional: Vital signs reviewed.  Patient is a well-developed and well-nourished female  in no acute distress and cooperative with exam. Alert and oriented x3.  Neck: Supple,  Cardiovascular: RRR, S1 normal, S2 normal, no MRG, pulses symmetric and intact bilaterally Pulmonary/Chest: CTAB, no wheezes, rales, or rhonchi Abdominal: Soft. Non-tender, non-distended, bowel sounds are normal  GU: no CVA tenderness Musculoskeletal: No joint deformities, erythema, or stiffness, ROM full and negative straight leg raise. Mild tenderness right  paraspinal on palpation Hematology: no cervical adenopathy.  Neurological: A&O x3, Strength is normal and symmetric bilaterally, cranial nerve II-XII are grossly intact, no focal motor deficit, sensory intact to light touch bilaterally.  Skin: Warm, dry and intact. No rash, cyanosis, or clubbing.

## 2012-02-27 ENCOUNTER — Encounter: Payer: Self-pay | Admitting: Internal Medicine

## 2012-02-27 ENCOUNTER — Ambulatory Visit (INDEPENDENT_AMBULATORY_CARE_PROVIDER_SITE_OTHER): Payer: Medicaid Other | Admitting: Internal Medicine

## 2012-02-27 VITALS — BP 137/76 | HR 86 | Temp 97.7°F | Ht 69.5 in | Wt 284.6 lb

## 2012-02-27 DIAGNOSIS — E119 Type 2 diabetes mellitus without complications: Secondary | ICD-10-CM

## 2012-02-27 DIAGNOSIS — M549 Dorsalgia, unspecified: Secondary | ICD-10-CM

## 2012-02-27 LAB — GLUCOSE, CAPILLARY: Glucose-Capillary: 123 mg/dL — ABNORMAL HIGH (ref 70–99)

## 2012-02-27 NOTE — Patient Instructions (Signed)
We would like you to continue to NOT TAKE GLIPIZIDE. Come back to see Dr. Tonny Branch toward the end of the month. Call us with questions or problems. Remember to stay active and stretch your back everyday!  Back Exercises Back exercises help treat and prevent back injuries. The goal is to increase your strength in your belly (abdominal) and back muscles. These exercises can also help with flexibility. Start these exercises when told by your doctor. HOME CARE Back exercises include: Pelvic Tilt.  Lie on your back with your knees bent. Tilt your pelvis until the lower part of your back is against the floor. Hold this position 5 to 10 sec. Repeat this exercise 5 to 10 times. Knee to Chest.  Pull 1 knee up against your chest and hold for 20 to 30 seconds. Repeat this with the other knee. This may be done with the other leg straight or bent, whichever feels better. Then, pull both knees up against your chest. Sit-Ups or Curl-Ups.  Bend your knees 90 degrees. Start with tilting your pelvis, and do a partial, slow sit-up. Only lift your upper half 30 to 45 degrees off the floor. Take at least 2 to 3 seonds for each sit-up. Do not do sit-ups with your knees out straight. If partial sit-ups are difficult, simply do the above but with only tightening your belly (abdominal) muscles and holding it as told. Hip-Lift.  Lie on your back with your knees flexed 90 degrees. Push down with your feet and shoulders as you raise your hips 2 inches off the floor. Hold for 10 seconds, repeat 5 to 10 times. Back Arches.  Lie on your stomach. Prop yourself up on bent elbows. Slowly press on your hands, causing an arch in your low back. Repeat 3 to 5 times. Shoulder-Lifts.  Lie face down with arms beside your body. Keep hips and belly pressed to floor as you slowly lift your head and shoulders off the floor. Do not overdo your exercises. Be careful in the beginning. Exercises may cause you some mild back discomfort. If  the pain lasts for more than 15 minutes, stop the exercises until you see your doctor. Improvement with exercise for back problems is slow.  Document Released: 02/05/2010 Document Revised: 03/28/2011 Document Reviewed: 11/04/2010 [facility] Patient Information 2013 [place], [place].   Exercise to Lose Weight Exercise and a healthy diet may help you lose weight. Your doctor may suggest specific exercises. EXERCISE IDEAS AND TIPS  Choose low-cost things you enjoy doing, such as walking, bicycling, or exercising to workout videos.  Take stairs instead of the elevator.  Walk during your lunch break.  Park your car further away from work or school.  Go to a gym or an exercise class.  Start with 5 to 10 minutes of exercise each day. Build up to 30 minutes of exercise 4 to 6 days a week.  Wear shoes with good support and comfortable clothes.  Stretch before and after working out.  Work out until you breathe harder and your heart beats faster.  Drink extra water when you exercise.  Do not do so much that you hurt yourself, feel dizzy, or get very short of breath. Exercises that burn about 150 calories:  Running 1  miles in 15 minutes.  Playing volleyball for 45 to 60 minutes.  Washing and waxing a car for 45 to 60 minutes.  Playing touch football for 45 minutes.  Walking 1  miles in 35 minutes.  Pushing a stroller 1  miles in 30 minutes.  Playing basketball for 30 minutes.  Raking leaves for 30 minutes.  Bicycling 5 miles in 30 minutes.  Walking 2 miles in 30 minutes.  Dancing for 30 minutes.  Shoveling snow for 15 minutes.  Swimming laps for 20 minutes.  Walking up stairs for 15 minutes.  Bicycling 4 miles in 15 minutes.  Gardening for 30 to 45 minutes.  Jumping rope for 15 minutes.  Washing windows or floors for 45 to 60 minutes. Document Released: 02/05/2010 Document Revised: 03/28/2011 Document Reviewed: 02/05/2010 [facility] Patient Information 2013  [place], [place].

## 2012-02-27 NOTE — Progress Notes (Signed)
Subjective:     Patient ID: [name], female   DOB: [birth year], 57 y.o.   MRN: [id number]  HPI The patient is a 57 year-old female who comes in today for a followup visit. She has past medical history of type 2 diabetes, hypertension, anemia. She was having hypoglycemic episodes at last visit. These were accompanied by nausea and vomiting. She had a medication stopped, glipizide. She did stop taking it after last visit. She states that the nausea and vomiting symptoms have improved dramatically. She has not had any nausea or vomiting spells since last visit. The back pain that she was experiencing at last visit is much relieved. She states she's been trying to exercise. She states that she does like to exercise outside most and since it is the winter she is having problems with maintaining level of exercise. Her back is not hurting her as much this visit. She has not had any hypoglycemic episodes since stopping glipizide.  Review of Systems  Constitutional: Negative for fever, chills, diaphoresis, activity change, appetite change, fatigue and unexpected weight change.  HENT: Negative.   Respiratory: Negative for cough, chest tightness, shortness of breath and wheezing.   Cardiovascular: Negative for chest pain, palpitations and leg swelling.  Gastrointestinal: Negative for nausea, vomiting, abdominal pain, diarrhea and constipation.  Endocrine: Negative for cold intolerance, heat intolerance, polydipsia, polyphagia and polyuria.  Genitourinary: Negative.   Musculoskeletal: Negative.   Skin: Negative.   Neurological: Negative for dizziness, tremors, syncope, weakness, light-headedness, numbness and headaches.  Psychiatric/Behavioral: Negative.        Objective:   Physical Exam  Constitutional: She is oriented to person, place, and time. She appears well-developed and well-nourished.  Obese  HENT:  Head: Normocephalic and atraumatic.  Eyes: EOM are normal. Pupils are equal, round,  and reactive to light.  Neck: Normal range of motion. Neck supple.  Cardiovascular: Normal rate and normal heart sounds.   Pulmonary/Chest: Effort normal and breath sounds normal. No respiratory distress. She has no wheezes.  Abdominal: Soft. Bowel sounds are normal. She exhibits no distension and no mass. There is no tenderness. There is no rebound and no guarding.  Musculoskeletal: Normal range of motion. She exhibits no edema and no tenderness.  Neurological: She is alert and oriented to person, place, and time. No cranial nerve deficit.  Skin: Skin is warm and dry. No rash noted. No erythema. No pallor.  Psychiatric: She has a normal mood and affect. Her behavior is normal.       Assessment/Plan:    1. Hypoglycemic episodes, nausea, vomiting-the patient is having resolution of all these issues. Suspect that low blood sugars were to blame for the nausea, hypoglycemia. She has since stopped taking glipizide and she has had resolution. Would advise continued cessation of glipizide.  2. Back pain-the patient is not having back pain as severe at this time. Did give her stretching exercises and advised that she do this daily. Did also give her exercises to help reduce weight and suggested that losing 5-10 pounds would help with her back pain. This would also help with her sugars.  3. Disposition-the patient will be seen back with her PCP later this month. No changes to medications at today's visit.

## 2012-02-29 ENCOUNTER — Other Ambulatory Visit: Payer: Self-pay | Admitting: Internal Medicine

## 2012-03-09 ENCOUNTER — Ambulatory Visit (INDEPENDENT_AMBULATORY_CARE_PROVIDER_SITE_OTHER): Payer: Medicaid Other | Admitting: Internal Medicine

## 2012-03-09 ENCOUNTER — Encounter: Payer: Self-pay | Admitting: Internal Medicine

## 2012-03-09 VITALS — BP 142/76 | HR 91 | Temp 97.1°F | Wt 289.9 lb

## 2012-03-09 DIAGNOSIS — K219 Gastro-esophageal reflux disease without esophagitis: Secondary | ICD-10-CM | POA: Insufficient documentation

## 2012-03-09 DIAGNOSIS — I10 Essential (primary) hypertension: Secondary | ICD-10-CM

## 2012-03-09 DIAGNOSIS — E119 Type 2 diabetes mellitus without complications: Secondary | ICD-10-CM

## 2012-03-09 MED ORDER — ROSUVASTATIN CALCIUM 5 MG PO TABS: 5.0000 mg | ORAL_TABLET | Freq: Every day | ORAL | Status: DC

## 2012-03-09 MED ORDER — PANTOPRAZOLE SODIUM 40 MG PO TBEC: 40.0000 mg | DELAYED_RELEASE_TABLET | Freq: Every day | ORAL | Status: DC

## 2012-03-09 MED ORDER — SITAGLIPTIN PHOSPHATE 100 MG PO TABS: 100.0000 mg | ORAL_TABLET | Freq: Every day | ORAL | Status: DC

## 2012-03-09 NOTE — Progress Notes (Signed)
Subjective:   Patient ID: [name] female   DOB: 06-Jun-1955 57 y.o.   MRN: [id number]  HPI: [name] is a 57 y.o. woman who presents to clinic for follow up on her chronic medical problems including diabetes, hypertension, and GERD.  See Problem focused Assessment and Plan for full details of her chronic medical conditions.   Past Medical History  Diagnosis Date  . Normocytic anemia   . Hypertension   . Diabetes mellitus   . Foot ulcer, left   . Leg edema    Current Outpatient Prescriptions  Medication Sig Dispense Refill  . acetaminophen (TYLENOL) 325 MG tablet Take 1 tablet (325 mg total) by mouth every 6 (six) hours as needed. For back pain.  30 tablet    . aspirin EC 81 MG tablet Take 81 mg by mouth daily.      . Calcium Carbonate-Vitamin D 600-400 MG-UNIT per chew tablet Chew 1 tablet by mouth 2 (two) times daily.  30 tablet  11  . diclofenac sodium (VOLTAREN) 1 % GEL Apply 2 g topically 4 (four) times daily as needed (for pain ).  1 Tube  1  . ferrous sulfate 325 (65 FE) MG tablet Take 325 mg by mouth daily. With orange juice      . furosemide (LASIX) 40 MG tablet Take 1 tablet (40 mg total) by mouth daily.  30 tablet  3  . glucose blood (ACCU-CHEK AVIVA PLUS) test strip 1 each by Other route as needed for other (3 times per day).  100 strip  11  . lisinopril (PRINIVIL,ZESTRIL) 20 MG tablet Take 1 tablet (20 mg total) by mouth daily.  30 tablet  3  . metFORMIN (GLUCOPHAGE) 1000 MG tablet Take 1 tablet (1,000 mg total) by mouth 2 (two) times daily with a meal.  60 tablet  5  . Multiple Vitamin (MULTIVITAMIN) tablet Take 1 tablet by mouth daily.       . ondansetron (ZOFRAN ODT) 4 MG disintegrating tablet Take 1 tablet (4 mg total) by mouth every 8 (eight) hours as needed for nausea.  20 tablet  0  . potassium chloride SA (K-DUR,KLOR-CON) 20 MEQ tablet Take 20 mEq by mouth daily.      . ranitidine (ZANTAC) 150 MG tablet Take 150 mg by mouth daily.       No  current facility-administered medications for this visit.   Family History  Problem Relation Age of Onset  . Diabetes Mother   . Heart disease Father    History   Social History  . Marital Status: Single    Spouse Name: N/A    Number of Children: N/A  . Years of Education: N/A   Social History Main Topics  . Smoking status: Former Smoker    Types: Cigarettes    Quit date: 12/22/1972  . Smokeless tobacco: None  . Alcohol Use: No  . Drug Use: No  . Sexually Active: No   Other Topics Concern  . 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   Smoked when she was a teenager   no alcohol   no Drugs      Financial assistance approved for 100% discount at [facility] and has [facility] card per Rudell Cobb   10/30/2009      She was just approved for Disability and is looking for an income controlled housing so she can move out of the  shelter.  Living in a rent controlled apartment.    Review of Systems: A full 12 system ROS is negative except as noted in the HPI and A&P.   Objective:  Physical Exam: Filed Vitals:   03/09/12 1557  BP: 142/76  Pulse: 91  Temp: 97.1 F (36.2 C)  TempSrc: Oral  Weight: 289 lb 14.4 oz (131.498 kg)  SpO2: 100%   Constitutional: Vital signs reviewed.  Patient is a well-developed and well-nourished morbidly obese woman in no acute distress and cooperative with exam. Alert and oriented x3.  Head: Normocephalic and atraumatic Ear: TM normal bilaterally Mouth: no erythema or exudates, MMM Eyes: PERRL, EOMI, conjunctivae normal, No scleral icterus.  Neck: Supple, Trachea midline normal ROM, No JVD, mass, thyromegaly, or carotid bruit present.  Cardiovascular: RRR, S1 normal, S2 normal, no MRG, pulses symmetric and intact bilaterally Pulmonary/Chest: CTAB, no wheezes, rales, or rhonchi Abdominal: Soft. Non-tender, non-distended, bowel sounds are normal, no masses, organomegaly, or guarding  present.  GU: no CVA tenderness Musculoskeletal: No joint deformities, erythema, or stiffness, ROM full and no nontender Hematology: no cervical, inginal, or axillary adenopathy.  Neurological: A&O x3, Strength is normal and symmetric bilaterally, cranial nerve II-XII are grossly intact, no focal motor deficit, sensory intact to light touch bilaterally.  Skin: Warm, dry and intact. No rash, cyanosis, or clubbing.  Psychiatric: Normal mood and affect. speech and behavior is normal. Judgment and thought content normal. Cognition and memory are normal.   Assessment & Plan:

## 2012-03-09 NOTE — Patient Instructions (Addendum)
1.  Stop the Pravastatin  2. Stop Potassium supplement  3.  Start Crestor 5 mg tablets.  Take 1 tablet at bedtime for your cholesterol  4.  Start Pantoprazole 40 mg tablets.  Take 1 tablet about 30 minutes before your largest meal of the day.    5.  Start Januvia.  Take 1 tablet daily for your blood sugars.  6.  Follow up in about 2 weeks to have your blood drawn.  I will call you if we need to go back on the Potassium.  If you don't hear from me everything is fine.  7. Follow up in May with me again to recheck your diabetes.    Diet for Gastroesophageal Reflux Disease, Adult Reflux (acid reflux) is when acid from your stomach flows up into the esophagus. When acid comes in contact with the esophagus, the acid causes irritation and soreness (inflammation) in the esophagus. When reflux happens often or so severely that it causes damage to the esophagus, it is called gastroesophageal reflux disease (GERD). Nutrition therapy can help ease the discomfort of GERD. FOODS OR DRINKS TO AVOID OR LIMIT  Smoking or chewing tobacco. Nicotine is one of the most potent stimulants to acid production in the gastrointestinal tract.  Caffeinated and decaffeinated coffee and black tea.  Regular or low-calorie carbonated beverages or energy drinks (caffeine-free carbonated beverages are allowed).   Strong spices, such as black pepper, white pepper, red pepper, cayenne, curry powder, and chili powder.  Peppermint or spearmint.  Chocolate.  High-fat foods, including meats and fried foods. Extra added fats including oils, butter, salad dressings, and nuts. Limit these to less than 8 tsp per day.  Fruits and vegetables if they are not tolerated, such as citrus fruits or tomatoes.  Alcohol.  Any food that seems to aggravate your condition. If you have questions regarding your diet, call your caregiver or a registered dietitian. OTHER THINGS THAT MAY HELP GERD INCLUDE:   Eating your meals slowly, in a  relaxed setting.  Eating 5 to 6 small meals per day instead of 3 large meals.  Eliminating food for a period of time if it causes distress.  Not lying down until 3 hours after eating a meal.  Keeping the head of your bed raised 6 to 9 inches (15 to 23 cm) by using a foam wedge or blocks under the legs of the bed. Lying flat may make symptoms worse.  Being physically active. Weight loss may be helpful in reducing reflux in overweight or obese adults.  Wear loose fitting clothing EXAMPLE MEAL PLAN This meal plan is approximately 2,000 calories based on https://www.[facility].org/ meal planning guidelines. Breakfast   cup cooked oatmeal.  1 cup strawberries.  1 cup low-fat milk.  1 oz almonds. Snack  1 cup cucumber slices.  6 oz yogurt (made from low-fat or fat-free milk). Lunch  2 slice whole-wheat bread.  2 oz sliced [place].  2 tsp mayonnaise.  1 cup blueberries.  1 cup snap peas. Snack  6 whole-wheat crackers.  1 oz string cheese. Dinner   cup brown rice.  1 cup mixed veggies.  1 tsp olive oil.  3 oz grilled fish. Document Released: 01/03/2005 Document Revised: 03/28/2011 Document Reviewed: 11/19/2010 [facility] Patient Information 2013 [place], [place].

## 2012-03-23 ENCOUNTER — Other Ambulatory Visit: Payer: Medicaid Other

## 2012-03-30 ENCOUNTER — Other Ambulatory Visit (INDEPENDENT_AMBULATORY_CARE_PROVIDER_SITE_OTHER): Payer: Medicaid Other

## 2012-03-30 DIAGNOSIS — I10 Essential (primary) hypertension: Secondary | ICD-10-CM

## 2012-03-30 LAB — BASIC METABOLIC PANEL
BUN: 22 mg/dL (ref 6–23)
CO2: 29 mEq/L (ref 19–32)
Chloride: 101 mEq/L (ref 96–112)
Creat: 1.16 mg/dL — ABNORMAL HIGH (ref 0.50–1.10)
Potassium: 4.3 mEq/L (ref 3.5–5.3)

## 2012-04-03 ENCOUNTER — Other Ambulatory Visit: Payer: Self-pay | Admitting: Internal Medicine

## 2012-04-05 ENCOUNTER — Telehealth: Payer: Self-pay | Admitting: *Deleted

## 2012-04-05 NOTE — Telephone Encounter (Signed)
PA request submitted online via [employer] [employer].  Pt had unacceptable side affect from pravastatin 20mg  tabs (leg cramps).  PA was approved until 04/15/2013.Sarah Spittle Cassady3/20/20142:15 PM

## 2012-04-23 DIAGNOSIS — H4011X0 Primary open-angle glaucoma, stage unspecified: Secondary | ICD-10-CM | POA: Diagnosis not present

## 2012-05-21 DIAGNOSIS — H4011X0 Primary open-angle glaucoma, stage unspecified: Secondary | ICD-10-CM | POA: Diagnosis not present

## 2012-05-21 DIAGNOSIS — E119 Type 2 diabetes mellitus without complications: Secondary | ICD-10-CM | POA: Diagnosis not present

## 2012-06-01 ENCOUNTER — Other Ambulatory Visit: Payer: Self-pay | Admitting: Internal Medicine

## 2012-06-08 ENCOUNTER — Encounter: Payer: Self-pay | Admitting: Internal Medicine

## 2012-06-08 ENCOUNTER — Ambulatory Visit (HOSPITAL_BASED_OUTPATIENT_CLINIC_OR_DEPARTMENT_OTHER): Payer: Medicare Other | Admitting: Internal Medicine

## 2012-06-08 VITALS — BP 149/83 | HR 81 | Temp 97.2°F | Ht 69.0 in | Wt 275.7 lb

## 2012-06-08 DIAGNOSIS — I10 Essential (primary) hypertension: Secondary | ICD-10-CM | POA: Diagnosis not present

## 2012-06-08 DIAGNOSIS — D649 Anemia, unspecified: Secondary | ICD-10-CM | POA: Diagnosis not present

## 2012-06-08 DIAGNOSIS — E119 Type 2 diabetes mellitus without complications: Secondary | ICD-10-CM

## 2012-06-08 MED ORDER — LISINOPRIL 20 MG PO TABS: 20.0000 mg | ORAL_TABLET | Freq: Every day | ORAL | Status: DC

## 2012-06-08 MED ORDER — METFORMIN HCL 1000 MG PO TABS: 1000.0000 mg | ORAL_TABLET | Freq: Two times a day (BID) | ORAL | Status: DC

## 2012-06-08 MED ORDER — FUROSEMIDE 40 MG PO TABS: 40.0000 mg | ORAL_TABLET | Freq: Every day | ORAL | Status: DC

## 2012-06-08 NOTE — Patient Instructions (Signed)
1.  Continue your medications as prescribed.  - You are doing a GREAT job with your diabetes!  2.  Keep working hard at losing weight.  You have made great progress  3.  Follow up in about 3 months to recheck your A1C and to meet your new primary care doctor.  4. It has been an absolute pleasure caring for your over the last 3 years.  I wish you all the best in the future.

## 2012-06-08 NOTE — Progress Notes (Signed)
Subjective:   Patient ID: [name] female   DOB: 1955-04-15 57 y.o.   MRN: [id number]  HPI: [name] is a 57 y.o. woman who presents to clinic for follow up on her chronic medical problems including diabetes and hypertension.  See Problem focused Assessment and Plan for full details of her chronic medical conditions.   She has been unable to tolerate crestor without problems with her legs.  She states that she has a restless feeling in her legs when she was on the medications.  She was taking the medication prior to bedtime and would have a hard time falling asleep because of the feeling of having to move her legs.  She states that she never has the problems with her legs during the day when taking the medication.  She states that she stopped the medication and the restlessness got better but did not go away completely.  She had a sleep study recently which showed OSA as well as central apnea.  There were no periodic limb movements noted during the sleep study.    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  She has been released from the wound care clinic and only needs PRN follow up with the podiatrist at this time.  She states that she has been taking her iron daily and denies weakness, fatigue, chest pain, or dizziness on standing.  She also denies any bleeding symptoms.    Past Medical History  Diagnosis Date  . Normocytic anemia   . Hypertension   . Diabetes mellitus   . Foot ulcer, left   . Leg edema    Current Outpatient Prescriptions  Medication Sig Dispense Refill  . acetaminophen (TYLENOL) 325 MG tablet Take 1 tablet (325 mg total) by mouth every 6 (six) hours as needed. For back pain.  30 tablet    . aspirin EC 81 MG tablet Take 81 mg by mouth daily.      . Calcium Carbonate-Vitamin D 600-400 MG-UNIT per chew tablet Chew 1 tablet by mouth 2 (two) times daily.   30 tablet  11  . diclofenac sodium (VOLTAREN) 1 % GEL Apply 2 g topically 4 (four) times daily as needed (for pain ).  1 Tube  1  . ferrous sulfate 325 (65 FE) MG tablet Take 325 mg by mouth daily. With orange juice      . furosemide (LASIX) 40 MG tablet Take 1 tablet (40 mg total) by mouth daily.  30 tablet  3  . glucose blood (ACCU-CHEK AVIVA PLUS) test strip 1 each by Other route as needed for other (3 times per day).  100 strip  11  . lisinopril (PRINIVIL,ZESTRIL) 20 MG tablet Take 1 tablet (20 mg total) by mouth daily.  30 tablet  3  . metFORMIN (GLUCOPHAGE) 1000 MG tablet Take 1 tablet (1,000 mg total) by mouth 2 (two) times daily with a meal.  60 tablet  4  . Multiple Vitamin (MULTIVITAMIN) tablet Take 1 tablet by mouth daily.       . ondansetron (ZOFRAN ODT) 4 MG disintegrating tablet Take 1 tablet (4 mg total) by mouth every 8 (eight) hours as needed for nausea.  20 tablet  0  . pantoprazole (PROTONIX) 40 MG tablet Take 1 tablet (40 mg total) by mouth daily.  30 tablet  2  . rosuvastatin (CRESTOR) 5 MG tablet Take 1 tablet (5 mg  total) by mouth daily.  30 tablet  3  . sitaGLIPtin (JANUVIA) 100 MG tablet Take 1 tablet (100 mg total) by mouth daily.  30 tablet  2  . [DISCONTINUED] pravastatin (PRAVACHOL) 20 MG tablet Take 1 tablet (20 mg total) by mouth daily.  30 tablet  6   No current facility-administered medications for this visit.   Family History  Problem Relation Age of Onset  . Diabetes Mother   . Heart disease Father    History   Social History  . Marital Status: Single    Spouse Name: N/A    Number of Children: N/A  . Years of Education: N/A   Social History Main Topics  . Smoking status: Former Smoker    Types: Cigarettes    Quit date: 12/22/1972  . Smokeless tobacco: None  . Alcohol Use: No  . Drug Use: No  . Sexually Active: No   Other Topics Concern  . 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   Smoked when she was a teenager   no alcohol   no Drugs      Financial assistance approved for 100% discount at [facility] and has [facility] card per Rudell Cobb   10/30/2009      She was just approved for Disability and is looking for an income controlled housing so she can move out of the shelter.  Living in a rent controlled apartment.    Review of Systems: A full 12 system ROS is negative except as noted in the HPI and A&P.   Objective:  Physical Exam: Filed Vitals:   06/08/12 1516  BP: 149/83  Pulse: 81  Temp: 97.2 F (36.2 C)  TempSrc: Oral  Height: 5\' 9"  (1.753 m)  Weight: 275 lb 11.2 oz (125.057 kg)  SpO2: 100%   Constitutional: Vital signs reviewed.  Patient is a well-developed and well-nourished woman in no acute distress and cooperative with exam. Alert and oriented x3.  Head: Normocephalic and atraumatic Ear: TM normal bilaterally Mouth: no erythema or exudates, MMM Eyes: PERRL, EOMI, conjunctivae normal, No scleral icterus.  Neck: Supple, Trachea midline normal ROM, No JVD, mass, thyromegaly, or carotid bruit present.  Cardiovascular: RRR, S1 normal, S2 normal, no MRG, pulses symmetric and intact bilaterally Pulmonary/Chest: CTAB, no wheezes, rales, or rhonchi Abdominal: Soft. Non-tender, non-distended, bowel sounds are normal, no masses, organomegaly, or guarding present.  GU: no CVA tenderness Musculoskeletal: No joint deformities, erythema, or stiffness, There is a brace on the left lower leg attached to her shoe and left knee.  Foot inspection shows a chronic callous formation over the left heal without surrounding erythema or induration.  ROM full and no nontender in the bilateral ankles, knees, and hips.   Hematology: no cervical, inginal, or axillary adenopathy.  Neurological: A&O x3, Strength is normal and symmetric bilaterally, cranial nerve II-XII are grossly intact, no focal motor deficit, sensory intact to light touch bilaterally.  Skin: Warm, dry and  intact. No rash, cyanosis, or clubbing.  Psychiatric: Normal mood and affect. speech and behavior is normal. Judgment and thought content normal. Cognition and memory are normal.   Assessment & Plan:

## 2012-06-12 DIAGNOSIS — H4011X0 Primary open-angle glaucoma, stage unspecified: Secondary | ICD-10-CM | POA: Diagnosis not present

## 2012-06-19 NOTE — Assessment & Plan Note (Signed)
CBC:    Component Value Date/Time   WBC 9.4 12/02/2011 1506   HGB 10.5* 12/02/2011 1506   HCT 31.8* 12/02/2011 1506   PLT 259 12/02/2011 1506   MCV 85.0 12/02/2011 1506   NEUTROABS 2.9 06/18/2010 0934   LYMPHSABS 1.9 06/18/2010 0934   MONOABS 0.4 06/18/2010 0934   EOSABS 0.2 06/18/2010 0934   BASOSABS 0.0 06/18/2010 0934   Her last CBC was done about 6 months ago.  She is due for follow up of her anemia of chronic inflammation.  She currently does not have an open wound which was thought to be the cause of her anemia.  She was supposed to get iron studies and a CBC drawn at todays visit but didn't stop at the lab on the way out the door.  We will recheck at her next appointment.

## 2012-06-19 NOTE — Assessment & Plan Note (Signed)
>>  ASSESSMENT AND PLAN FOR NORMOCYTIC ANEMIA WRITTEN ON 06/19/2012  3:03 PM BY PRIBULA, CHRISTOPHER, MD  CBC:    Component Value Date/Time   WBC 9.4 12/02/2011 1506   HGB 10.5* 12/02/2011 1506   HCT 31.8* 12/02/2011 1506   PLT 259 12/02/2011 1506   MCV 85.0 12/02/2011 1506   NEUTROABS 2.9 06/18/2010 0934   LYMPHSABS 1.9 06/18/2010 0934   MONOABS 0.4 06/18/2010 0934   EOSABS 0.2 06/18/2010 0934   BASOSABS 0.0 06/18/2010 0934   Her last CBC was done about 6 months ago.  She is due for follow up of her anemia of chronic inflammation.  She currently does not have an open wound which was thought to be the cause of her anemia.  She was supposed to get iron studies and a CBC drawn at todays visit but didn't stop at the lab on the way out the door.  We will recheck at her next appointment.

## 2012-06-19 NOTE — Assessment & Plan Note (Signed)
BP Readings from Last 3 Encounters:  06/08/12 149/83  03/09/12 142/76  02/27/12 137/76    Lab Results  Component Value Date   NA 135 03/30/2012   K 4.3 03/30/2012   CREATININE 1.16* 03/30/2012    Assessment: Blood pressure control: mildly elevated Progress toward BP goal:  unchanged Comments: She has been taking her medications as prescribed and her blood pressure is mildly elevated above her goal today.  She has been consistently right around her goal.   Plan: Medications:  continue current medications Educational resources provided: brochure;handout;web site link Self management tools provided:   Other plans: We will continue to watch and if she remains elevated at her next appointment we will choose either to increase her lisinopril or add a beta-blocker such as coreg.

## 2012-06-19 NOTE — Assessment & Plan Note (Signed)
Lab Results  Component Value Date   HGBA1C 6.6 06/08/2012   HGBA1C 6.5 02/20/2012   HGBA1C 7.5 12/02/2011     Assessment: Diabetes control: good control (HgbA1C at goal) Progress toward A1C goal:  at goal Comments: She states that she has been taking her medications as prescribed and denies any signs of hypoglycemia including jitteriness, sweats, confusion, or fatigue.  She also denies polyuria and polydipisa.    Plan: Medications:  continue current medications Home glucose monitoring: Frequency: once a day Timing: before breakfast Instruction/counseling given: reminded to bring blood glucose meter & log to each visit, reminded to bring medications to each visit, discussed foot care and discussed diet Educational resources provided: brochure;handout Self management tools provided:   Other plans: She has done well with her weight loss and I encouraged her to continue watching her portions and eating healthy.  She has been intolerate to statins with pravastatin and Crestor causing a worsening of what sounds like restless legs syndrome.  We will have her monitor her restless legs and if they continue to bother her even in light of stopping her Crestor we will restart the crestor and consider treatment for restless legs.

## 2012-06-20 NOTE — Progress Notes (Signed)
Case discussed with Dr. Tonny Branch  at the time of the visit, immediately after the resident saw the patient.  I reviewed the resident's history and exam and pertinent patient test results.  I agree with the assessment, diagnosis and plan of care documented in the resident's note.

## 2012-06-25 ENCOUNTER — Encounter: Payer: Self-pay | Admitting: Internal Medicine

## 2012-06-25 NOTE — Assessment & Plan Note (Signed)
BP Readings from Last 10 Encounters:  03/09/12 142/76  02/27/12 137/76  02/20/12 137/82   She states that she has been taking her medications as prescribed and denies any side effects from her blood pressure medications.  She has been on potassium for sometime and wants to know if she needs to continue the medication.  We will check a bmet today to monitor  Potassium (mEq/L)  Date Value  12/02/2011 4.8   09/09/2011 4.9    She forgot to stop in the lab to get her blood drawn.  Her last potassium on the supplementation was 4.8 so we will stop her supplementation and recheck in 2 weeks to see if she needs to be back on it.

## 2012-06-25 NOTE — Assessment & Plan Note (Addendum)
Hemoglobin A1C (%)  Date Value  12/02/2011 7.5   09/09/2011 7.1   05/20/2011 7.0    Lab Results  Component Value Date   MICROALBUR 0.50 05/26/2010   LDLCALC 93 09/09/2011   Her a1C has been well controlled and she continues her current medications without side effects.  She states that she feels like she has been having lows but doesn't test.  These usually happen before lunch.     We will have her test her blood sugar during these episodes and she was reminded to have a morning snack to help avoid these episodes.  We discussed primary prevention with daily aspirin as well as adding a Statin for prevention of MI and stroke.  She will start these medications including ASA 81 mg daily and Pravastatin 20 mg daily.

## 2012-06-25 NOTE — Assessment & Plan Note (Signed)
She states that she has been using her Bipap every night and she LOVES it.  She state that she has less pain and much more strength and endurance throughout the day.  She is sleeping well through the night as well.  She denies dry mouth, daytime somnolence, or insomnia.    We will continue the Bipap for now as she has excellent compliance and has had a great result.

## 2012-06-25 NOTE — Assessment & Plan Note (Addendum)
She states that when she started the Pravastatin after her last visit she has been getting cramps in her legs.  She stopped the medication and the cramps resolved.   Hemoglobin A1C (%)  Date Value  02/20/2012 6.5   12/02/2011 7.5   12/17/2009 5.7   10/08/2009 6.1   08/07/2009 8.5    She states that she was having hypoglycemia so she stopped her glipizide.  She has noted that since then her blood sugars have been climbing.  We will start Januvia today to help augment her response to metformin with a smaller chance of causing hypoglycemia then Glipizide.  Since she was unable to tolerate the pravastatin we will start Crestor.  We chose crestor because she was having muscle aches to Pravastatin.  We decided to not do Simvastatin or lipitor because they generally have a higher rate of muscle side effects then Crestor.

## 2012-06-25 NOTE — Assessment & Plan Note (Signed)
BP Readings from Last 10 Encounters:  12/23/11 113/74  12/02/11 139/81  10/29/11 147/80   She states that she is taking her medications as prescribed and using her Bipap.  She has checked her BP several times at the pharmacy and all have been less then 130/80.    Her BP is well controlled today and she is feeling well with no side effects.  We will continue to monitor to her goal of <130/80.

## 2012-06-25 NOTE — Assessment & Plan Note (Signed)
She states that has been having problems with nausea for over a month.  She sometimes feels like food getting stuck in her throat and burning in the back of her throat.  This is worse with certain foods like peppers and onions.  She used to be on protonix but has stopped it.  Her symptoms are consistent with a recurrence of her GERD.  We will restart her protonix and we discussed dietary changes for GERD patients.

## 2012-08-01 ENCOUNTER — Other Ambulatory Visit: Payer: Self-pay | Admitting: Internal Medicine

## 2012-08-01 NOTE — Telephone Encounter (Signed)
Needs end of Aug or sept appt with PCP

## 2012-08-13 ENCOUNTER — Other Ambulatory Visit: Payer: Self-pay | Admitting: *Deleted

## 2012-08-13 MED ORDER — GLUCOSE BLOOD VI STRP: ORAL_STRIP | Status: DC

## 2012-08-15 ENCOUNTER — Other Ambulatory Visit: Payer: Self-pay | Admitting: *Deleted

## 2012-08-15 MED ORDER — GLUCOSE BLOOD VI STRP: ORAL_STRIP | Status: DC

## 2012-08-15 NOTE — Telephone Encounter (Signed)
Fax from [facility] - for all Medicare pts, need the rx (hard-copy) faxed in. Can u re-print rx and I will fax it to the pharmacy. Thanks

## 2012-08-15 NOTE — Telephone Encounter (Signed)
Rx for test strips  Faxed to [facility] pharmacy on [address].

## 2012-08-28 ENCOUNTER — Other Ambulatory Visit: Payer: Self-pay | Admitting: Internal Medicine

## 2012-08-28 ENCOUNTER — Encounter: Payer: Self-pay | Admitting: Internal Medicine

## 2012-08-28 ENCOUNTER — Ambulatory Visit (INDEPENDENT_AMBULATORY_CARE_PROVIDER_SITE_OTHER): Payer: Medicare Other | Admitting: Internal Medicine

## 2012-08-28 VITALS — BP 136/79 | HR 99 | Temp 98.7°F | Wt 264.1 lb

## 2012-08-28 DIAGNOSIS — L97909 Non-pressure chronic ulcer of unspecified part of unspecified lower leg with unspecified severity: Secondary | ICD-10-CM | POA: Diagnosis not present

## 2012-08-28 DIAGNOSIS — R609 Edema, unspecified: Secondary | ICD-10-CM | POA: Diagnosis not present

## 2012-08-28 DIAGNOSIS — G4733 Obstructive sleep apnea (adult) (pediatric): Secondary | ICD-10-CM

## 2012-08-28 DIAGNOSIS — K219 Gastro-esophageal reflux disease without esophagitis: Secondary | ICD-10-CM

## 2012-08-28 DIAGNOSIS — I10 Essential (primary) hypertension: Secondary | ICD-10-CM

## 2012-08-28 DIAGNOSIS — E119 Type 2 diabetes mellitus without complications: Secondary | ICD-10-CM

## 2012-08-28 LAB — POCT GLYCOSYLATED HEMOGLOBIN (HGB A1C): Hemoglobin A1C: 6.5

## 2012-08-28 LAB — GLUCOSE, CAPILLARY: Glucose-Capillary: 136 mg/dL — ABNORMAL HIGH (ref 70–99)

## 2012-08-28 MED ORDER — FUROSEMIDE 40 MG PO TABS: ORAL_TABLET | ORAL | Status: DC

## 2012-08-28 MED ORDER — PANTOPRAZOLE SODIUM 40 MG PO TBEC: 40.0000 mg | DELAYED_RELEASE_TABLET | Freq: Every day | ORAL | Status: DC

## 2012-08-28 MED ORDER — METFORMIN HCL 1000 MG PO TABS: 1000.0000 mg | ORAL_TABLET | Freq: Two times a day (BID) | ORAL | Status: DC

## 2012-08-28 MED ORDER — SITAGLIPTIN PHOSPHATE 100 MG PO TABS: 100.0000 mg | ORAL_TABLET | Freq: Every day | ORAL | Status: DC

## 2012-08-28 MED ORDER — LISINOPRIL 20 MG PO TABS: ORAL_TABLET | ORAL | Status: DC

## 2012-08-28 NOTE — Progress Notes (Signed)
Subjective:   Patient ID: [name] female   DOB: 22-Aug-1955 57 y.o.   MRN: [id number]  HPI: [name] is a 57 y.o. woman who comes to clinic today for routing f/u regarding her BM. See problem based charting.    Past Medical History  Diagnosis Date  . Normocytic anemia   . Hypertension   . Diabetes mellitus   . Foot ulcer, left   . Leg edema    Current Outpatient Prescriptions  Medication Sig Dispense Refill  . aspirin EC 81 MG tablet Take 81 mg by mouth daily.      . ferrous sulfate 325 (65 FE) MG tablet Take 325 mg by mouth daily. With orange juice      . furosemide (LASIX) 40 MG tablet TAKE 1 TABLET BY MOUTH DAILY  30 tablet  2  . glucose blood (ACCU-CHEK AVIVA PLUS) test strip Use as directed for once daily glucose testing. Dx: ICD-9 250.00.  100 each  1  . lisinopril (PRINIVIL,ZESTRIL) 20 MG tablet TAKE 1 TABLET BY MOUTH DAILY  30 tablet  2  . metFORMIN (GLUCOPHAGE) 1000 MG tablet Take 1 tablet (1,000 mg total) by mouth 2 (two) times daily with a meal.  60 tablet  4  . Multiple Vitamin (MULTIVITAMIN) tablet Take 1 tablet by mouth daily.       . pantoprazole (PROTONIX) 40 MG tablet Take 1 tablet (40 mg total) by mouth daily.  30 tablet  2  . sitaGLIPtin (JANUVIA) 100 MG tablet Take 1 tablet (100 mg total) by mouth daily.  30 tablet  2  . acetaminophen (TYLENOL) 325 MG tablet Take 1 tablet (325 mg total) by mouth every 6 (six) hours as needed. For back pain.  30 tablet    . Calcium Carbonate-Vitamin D 600-400 MG-UNIT per chew tablet Chew 1 tablet by mouth 2 (two) times daily.  30 tablet  11  . diclofenac sodium (VOLTAREN) 1 % GEL Apply 2 g topically 4 (four) times daily as needed (for pain ).  1 Tube  1  . [DISCONTINUED] pravastatin (PRAVACHOL) 20 MG tablet Take 1 tablet (20 mg total) by mouth daily.  30 tablet  6   No current facility-administered medications for this visit.   Family History  Problem Relation Age of Onset  . Diabetes Mother   . Heart  disease Father    History   Social History  . Marital Status: Single    Spouse Name: N/A    Number of Children: N/A  . Years of Education: N/A   Social History Main Topics  . Smoking status: Former Smoker    Types: Cigarettes    Quit date: 12/22/1972  . Smokeless tobacco: None  . Alcohol Use: No  . Drug Use: No  . Sexually Active: No   Other Topics Concern  . 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   Smoked when she was a teenager   no alcohol   no Drugs      Financial assistance approved for 100% discount at [facility] and has [facility] card per Rudell Cobb   10/30/2009      She was just approved for Disability and is looking for an income controlled housing so she can move out of the shelter.  Living in a rent controlled apartment.    Review of Systems:  Review of Systems  Constitutional: Negative for fever, chills and  malaise/fatigue.  HENT: Negative for sore throat.   Eyes: Negative for blurred vision and double vision.  Respiratory: Negative for cough and shortness of breath.   Cardiovascular: Positive for leg swelling. Negative for chest pain and palpitations.  Gastrointestinal: Positive for heartburn. Negative for nausea, vomiting, diarrhea and constipation.  Genitourinary: Negative for dysuria, urgency and frequency.  Skin: Negative for itching and rash.  Neurological: Negative for weakness and headaches.     Objective:  Physical Exam: Filed Vitals:   08/28/12 1532  BP: 136/79  Pulse: 99  Temp: 98.7 F (37.1 C)  TempSrc: Oral  Weight: 264 lb 1.6 oz (119.795 kg)  SpO2: 97%   Physical Exam  Constitutional: She appears well-developed and well-nourished. No distress.  HENT:  Head: Normocephalic and atraumatic.  Mouth/Throat: No oropharyngeal exudate.  Eyes: EOM are normal. Pupils are equal, round, and reactive to light.  Neck: Normal range of motion. Neck supple. No thyromegaly present.    Cardiovascular: Normal rate, regular rhythm, normal heart sounds and intact distal pulses.  Exam reveals no gallop and no friction rub.   No murmur heard. Pulmonary/Chest: Effort normal and breath sounds normal. No respiratory distress. She has no wheezes. She has no rales.  Lymphadenopathy:    She has no cervical adenopathy.  Skin: She is not diaphoretic.     Assessment & Plan:

## 2012-08-28 NOTE — Assessment & Plan Note (Addendum)
Lab Results  Component Value Date   HGBA1C 6.5 08/28/2012   HGBA1C 6.6 06/08/2012   HGBA1C 6.5 02/20/2012     Assessment: Diabetes control: good control (HgbA1C at goal) Progress toward A1C goal:  at goal Comments: The patient reports that she has been doing great with her DM. She has modified her diet and is more active. She eats less chocolate and sweets. She states that she feels so much better since making these changes.  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 She is having no acute foot problems at the moment, just would like new shoes. Plan to check labs today. F.U in 3 months with PCP.

## 2012-08-28 NOTE — Assessment & Plan Note (Signed)
The patient has been referred to podiatry for new diabetic shoes. She has no acute complaints today.

## 2012-08-28 NOTE — Patient Instructions (Addendum)
Keep up to good work!  Referral to podiatry they will call with your appointment  Follow up with PCP in 3 months.

## 2012-08-28 NOTE — Assessment & Plan Note (Signed)
Stable. Doing well at home with CPAP.

## 2012-08-28 NOTE — Assessment & Plan Note (Signed)
Stable on current lasix dose.

## 2012-08-28 NOTE — Assessment & Plan Note (Signed)
BP Readings from Last 3 Encounters:  08/28/12 136/79  06/08/12 149/83  03/09/12 142/76    Lab Results  Component Value Date   NA 135 03/30/2012   K 4.3 03/30/2012   CREATININE 1.16* 03/30/2012    Assessment: Blood pressure control: controlled Progress toward BP goal:  at goal Comments: The patient has lost significant weight over the last 6 months and her blood pressure has come down during this time. She is feeling gret and is very motivated to continue the good progress.  Plan: Medications:  continue current medications Educational resources provided: brochure Self management tools provided: home blood pressure logbook Other plans: F/U with PCP in 3 months.

## 2012-08-28 NOTE — Assessment & Plan Note (Signed)
Patient has noticed improvement with PPI and especially with eating smaller dinners earlier in the day.

## 2012-08-29 ENCOUNTER — Other Ambulatory Visit: Payer: Self-pay | Admitting: Internal Medicine

## 2012-08-29 ENCOUNTER — Telehealth: Payer: Self-pay | Admitting: Internal Medicine

## 2012-08-29 DIAGNOSIS — E1110 Type 2 diabetes mellitus with ketoacidosis without coma: Secondary | ICD-10-CM

## 2012-08-29 LAB — BASIC METABOLIC PANEL WITH GFR
BUN: 41 mg/dL — ABNORMAL HIGH (ref 6–23)
CO2: 27 mEq/L (ref 19–32)
Calcium: 11.5 mg/dL — ABNORMAL HIGH (ref 8.4–10.5)
Chloride: 100 mEq/L (ref 96–112)
Creat: 1.8 mg/dL — ABNORMAL HIGH (ref 0.50–1.10)
GFR, Est African American: 36 mL/min — ABNORMAL LOW
GFR, Est Non African American: 31 mL/min — ABNORMAL LOW
Glucose, Bld: 138 mg/dL — ABNORMAL HIGH (ref 70–99)
Potassium: 4.5 mEq/L (ref 3.5–5.3)
Sodium: 134 mEq/L — ABNORMAL LOW (ref 135–145)

## 2012-08-29 LAB — LIPID PANEL
Cholesterol: 131 mg/dL (ref 0–200)
HDL: 41 mg/dL (ref >39)
LDL Cholesterol: 70 mg/dL (ref 0–99)
Total CHOL/HDL Ratio: 3.2 Ratio
Triglycerides: 98 mg/dL (ref <150)
VLDL: 20 mg/dL (ref 0–40)

## 2012-08-29 NOTE — Telephone Encounter (Signed)
I shared [name]'s lab results with her over the telephone. She agreed to come in tomorrow and have her labs rechecked. She also agreed to stop lasix and metformin in the meantime. I will plan to f/u the results as indicated.

## 2012-08-30 ENCOUNTER — Other Ambulatory Visit: Payer: Self-pay | Admitting: Internal Medicine

## 2012-08-30 ENCOUNTER — Other Ambulatory Visit (INDEPENDENT_AMBULATORY_CARE_PROVIDER_SITE_OTHER): Payer: Medicare Other

## 2012-08-30 DIAGNOSIS — E119 Type 2 diabetes mellitus without complications: Secondary | ICD-10-CM

## 2012-08-30 DIAGNOSIS — E1310 Other specified diabetes mellitus with ketoacidosis without coma: Secondary | ICD-10-CM | POA: Diagnosis not present

## 2012-08-30 DIAGNOSIS — E1110 Type 2 diabetes mellitus with ketoacidosis without coma: Secondary | ICD-10-CM

## 2012-08-30 LAB — CBC WITH DIFFERENTIAL/PLATELET
Basophils Relative: 0 % (ref 0–1)
MCHC: 34.1 g/dL (ref 30.0–36.0)
Neutro Abs: 4.7 10*3/uL (ref 1.7–7.7)
Neutrophils Relative %: 61 % (ref 43–77)
Platelets: 243 10*3/uL (ref 150–400)
RDW: 15.7 % — ABNORMAL HIGH (ref 11.5–15.5)
WBC: 7.7 10*3/uL (ref 4.0–10.5)

## 2012-08-30 LAB — URINALYSIS, ROUTINE W REFLEX MICROSCOPIC
Glucose, UA: NEGATIVE mg/dL
Leukocytes, UA: NEGATIVE
Nitrite: NEGATIVE
pH: 5.5 (ref 5.0–8.0)

## 2012-08-30 LAB — COMPLETE METABOLIC PANEL WITH GFR
AST: 19 U/L (ref 0–37)
BUN: 44 mg/dL — ABNORMAL HIGH (ref 6–23)
CO2: 26 mEq/L (ref 19–32)
Calcium: 11.4 mg/dL — ABNORMAL HIGH (ref 8.4–10.5)
Chloride: 101 mEq/L (ref 96–112)
Creat: 1.72 mg/dL — ABNORMAL HIGH (ref 0.50–1.10)
GFR, Est African American: 38 mL/min — ABNORMAL LOW
Total Bilirubin: 0.3 mg/dL (ref 0.3–1.2)

## 2012-08-31 LAB — MICROALBUMIN / CREATININE URINE RATIO: Microalb Creat Ratio: 28.4 mg/g (ref 0.0–30.0)

## 2012-08-31 LAB — PTH, INTACT AND CALCIUM
Calcium: 11.1 mg/dL — ABNORMAL HIGH (ref 8.4–10.5)
PTH: 3.7 pg/mL — ABNORMAL LOW (ref 14.0–72.0)

## 2012-08-31 NOTE — Progress Notes (Signed)
I saw and evaluated the patient.  I personally confirmed the key portions of the history and exam documented by Dr. Glendell Docker and I reviewed pertinent patient test results.  The assessment, diagnosis, and plan were formulated together and I agree with the documentation in the resident's note.  Please note in HPI - she is here for Follow up of *DM

## 2012-09-03 MED ORDER — SITAGLIPTIN PHOSPHATE 100 MG PO TABS: 100.0000 mg | ORAL_TABLET | Freq: Every day | ORAL | Status: DC

## 2012-09-04 ENCOUNTER — Other Ambulatory Visit (HOSPITAL_COMMUNITY): Payer: Self-pay | Admitting: Occupational Therapy

## 2012-09-04 ENCOUNTER — Other Ambulatory Visit: Payer: Self-pay | Admitting: Internal Medicine

## 2012-09-04 DIAGNOSIS — Z1231 Encounter for screening mammogram for malignant neoplasm of breast: Secondary | ICD-10-CM

## 2012-09-05 ENCOUNTER — Other Ambulatory Visit: Payer: Self-pay | Admitting: Internal Medicine

## 2012-09-05 ENCOUNTER — Telehealth: Payer: Self-pay | Admitting: Internal Medicine

## 2012-09-05 DIAGNOSIS — N179 Acute kidney failure, unspecified: Secondary | ICD-10-CM

## 2012-09-05 DIAGNOSIS — Z1211 Encounter for screening for malignant neoplasm of colon: Secondary | ICD-10-CM

## 2012-09-05 NOTE — Telephone Encounter (Signed)
I called the patient and left a message informing her that I have scheduled a labs only visit for 8/21 or 8/22.

## 2012-09-06 ENCOUNTER — Other Ambulatory Visit (INDEPENDENT_AMBULATORY_CARE_PROVIDER_SITE_OTHER): Payer: Medicare Other

## 2012-09-06 DIAGNOSIS — N179 Acute kidney failure, unspecified: Secondary | ICD-10-CM

## 2012-09-07 LAB — BASIC METABOLIC PANEL WITH GFR
CO2: 25 mEq/L (ref 19–32)
Calcium: 9.9 mg/dL (ref 8.4–10.5)
Creat: 1.31 mg/dL — ABNORMAL HIGH (ref 0.50–1.10)
GFR, Est African American: 53 mL/min — ABNORMAL LOW
GFR, Est Non African American: 46 mL/min — ABNORMAL LOW

## 2012-09-07 LAB — VITAMIN A

## 2012-09-10 LAB — PROTEIN ELECTROPH W RFLX QUANT IMMUNOGLOBULINS
Alpha-2-Globulin: 10.1 % (ref 7.1–11.8)
Beta 2: 6.3 % (ref 3.2–6.5)
Beta Globulin: 5.9 % (ref 4.7–7.2)
Gamma Globulin: 20.2 % — ABNORMAL HIGH (ref 11.1–18.8)
M-Spike, %: NOT DETECTED g/dL

## 2012-09-10 LAB — VITAMIN D 1,25 DIHYDROXY: Vitamin D2 1, 25 (OH)2: <8 pg/mL

## 2012-09-11 LAB — PROTEIN ELECTROPHORESIS, URINE REFLEX

## 2012-09-12 LAB — PTH-RELATED PEPTIDE: PTH-Related Protein (PTH-RP): 21 pg/mL (ref 14–27)

## 2012-09-18 ENCOUNTER — Other Ambulatory Visit (INDEPENDENT_AMBULATORY_CARE_PROVIDER_SITE_OTHER): Payer: Medicare Other

## 2012-09-18 DIAGNOSIS — Z1211 Encounter for screening for malignant neoplasm of colon: Secondary | ICD-10-CM

## 2012-09-18 LAB — POC HEMOCCULT BLD/STL (HOME/3-CARD/SCREEN): Card #2 Fecal Occult Blod, POC: NEGATIVE

## 2012-09-26 ENCOUNTER — Other Ambulatory Visit: Payer: Self-pay | Admitting: Internal Medicine

## 2012-09-28 DIAGNOSIS — H4011X0 Primary open-angle glaucoma, stage unspecified: Secondary | ICD-10-CM | POA: Diagnosis not present

## 2012-10-10 ENCOUNTER — Ambulatory Visit (HOSPITAL_COMMUNITY): Payer: Medicare Other

## 2012-10-10 ENCOUNTER — Encounter: Payer: Self-pay | Admitting: Internal Medicine

## 2012-10-10 ENCOUNTER — Ambulatory Visit (INDEPENDENT_AMBULATORY_CARE_PROVIDER_SITE_OTHER): Payer: Medicare Other | Admitting: Internal Medicine

## 2012-10-10 VITALS — BP 136/82 | HR 83 | Temp 97.8°F | Wt 264.7 lb

## 2012-10-10 DIAGNOSIS — I10 Essential (primary) hypertension: Secondary | ICD-10-CM | POA: Diagnosis not present

## 2012-10-10 DIAGNOSIS — G4733 Obstructive sleep apnea (adult) (pediatric): Secondary | ICD-10-CM | POA: Diagnosis not present

## 2012-10-10 DIAGNOSIS — E119 Type 2 diabetes mellitus without complications: Secondary | ICD-10-CM

## 2012-10-10 DIAGNOSIS — R0989 Other specified symptoms and signs involving the circulatory and respiratory systems: Secondary | ICD-10-CM | POA: Diagnosis not present

## 2012-10-10 DIAGNOSIS — Z79899 Other long term (current) drug therapy: Secondary | ICD-10-CM | POA: Diagnosis not present

## 2012-10-10 DIAGNOSIS — Z23 Encounter for immunization: Secondary | ICD-10-CM | POA: Diagnosis not present

## 2012-10-10 DIAGNOSIS — IMO0002 Reserved for concepts with insufficient information to code with codable children: Secondary | ICD-10-CM | POA: Diagnosis not present

## 2012-10-10 DIAGNOSIS — S90822A Blister (nonthermal), left foot, initial encounter: Secondary | ICD-10-CM

## 2012-10-10 DIAGNOSIS — R110 Nausea: Secondary | ICD-10-CM | POA: Diagnosis not present

## 2012-10-10 LAB — GLUCOSE, CAPILLARY: Glucose-Capillary: 112 mg/dL — ABNORMAL HIGH (ref 70–99)

## 2012-10-10 NOTE — Patient Instructions (Signed)
Do not use compression stockings until blister has healed Try to place less weight on foot. Follow up with Podiatry for proper foot wear.

## 2012-10-10 NOTE — Assessment & Plan Note (Addendum)
BP Readings from Last 3 Encounters:  10/10/12 136/82  08/28/12 136/79  06/08/12 149/83    Lab Results  Component Value Date   NA 138 09/06/2012   K 4.3 09/06/2012   CREATININE 1.31* 09/06/2012    Assessment: Blood pressure control: controlled Progress toward BP goal:  at goal Comments:   Plan: Medications:  Continue lisinopril 20mg  Educational resources provided: brochure Self management tools provided: home blood pressure logbook Other plans:

## 2012-10-10 NOTE — Assessment & Plan Note (Addendum)
Dorsal pedal pulse and posterior tibial pulse was noted to be diminished on the left side.  Will obtain ABI as patient has history of foot ulcer on that side, she denies symptoms of claudication.  Addendum: ABI completed and wnl.

## 2012-10-10 NOTE — Assessment & Plan Note (Signed)
Patient has a blister on the ball of the foot, she reports increased exercise and walking demands at work.  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  The skin over the blister is intact and no signs of infection.  She was referred to podiatry at last visit to get better fitting shoes. She was advised to decease pressure on the foot and monitor for signs of skin breaking.  We called podiatry to see if patient can be seen sooner but not possible at that location. Will find out if patient can be seen elsewhere.

## 2012-10-10 NOTE — Progress Notes (Signed)
Patient ID: [name], female   DOB: [birth year], 57 y.o.   MRN: [id number]   Subjective:   Patient ID: [name] female   DOB: Dec 16, 1955 57 y.o.   MRN: [id number]  HPI: [name] is a 57 y.o. female with PMH of HTN, well controlled Diabetes, and DFU.  She presents today for evaluation after she noticed a blister on her left foot on Monday.  She as she has a history of a left food ulcer she checks her feet every night.  She reports the area is not painful and has had no trauma to the area. She reports increased exercise and time on her feet lately, her diabetes has been well controlled and she is very proud of that.  Her glucometer was downloaded and showed 90% of readings in range with no hypoglycemic readings.    Past Medical History  Diagnosis Date  . Normocytic anemia   . Hypertension   . Diabetes mellitus   . Foot ulcer, left   . Leg edema    Current Outpatient Prescriptions  Medication Sig Dispense Refill  . acetaminophen (TYLENOL) 325 MG tablet Take 1 tablet (325 mg total) by mouth every 6 (six) hours as needed. For back pain.  30 tablet    . aspirin EC 81 MG tablet Take 81 mg by mouth daily.      . Calcium Carbonate-Vitamin D 600-400 MG-UNIT per chew tablet Chew 1 tablet by mouth 2 (two) times daily.  30 tablet  11  . ferrous sulfate 325 (65 FE) MG tablet Take 325 mg by mouth daily. With orange juice      . glucose blood (ACCU-CHEK AVIVA PLUS) test strip Use as directed for once daily glucose testing. Dx: ICD-9 250.00.  100 each  1  . lisinopril (PRINIVIL,ZESTRIL) 20 MG tablet TAKE 1 TABLET BY MOUTH DAILY  30 tablet  2  . Multiple Vitamin (MULTIVITAMIN) tablet Take 1 tablet by mouth daily.       . pantoprazole (PROTONIX) 40 MG tablet TAKE 1 TABLET BY MOUTH DAILY  90 tablet  1  . sitaGLIPtin (JANUVIA) 100 MG tablet Take 1 tablet (100 mg total) by mouth daily.  90 tablet  1  . [DISCONTINUED] pravastatin (PRAVACHOL) 20 MG tablet Take 1 tablet (20 mg total)  by mouth daily.  30 tablet  6   No current facility-administered medications for this visit.   Family History  Problem Relation Age of Onset  . Diabetes Mother   . Heart disease Father    History   Social History  . Marital Status: Single    Spouse Name: N/A    Number of Children: N/A  . Years of Education: N/A   Social History Main Topics  . Smoking status: Former Smoker    Types: Cigarettes    Quit date: 12/22/1972  . Smokeless tobacco: None  . Alcohol Use: No  . Drug Use: No  . Sexual Activity: No   Other Topics Concern  . 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   Smoked when she was a teenager   no alcohol   no Drugs      Financial assistance approved for 100% discount at [facility] and has [facility] card per Rudell Cobb   10/30/2009      She was just approved for Disability and is looking for an income controlled housing so she can move out  of the shelter.  Living in a rent controlled apartment.    Review of Systems: Review of Systems  Constitutional: Negative for fever, chills, weight loss and malaise/fatigue.  HENT: Negative for congestion.   Eyes: Negative for blurred vision.  Respiratory: Negative for cough, sputum production and shortness of breath.   Cardiovascular: Negative for chest pain, palpitations and leg swelling.  Gastrointestinal: Negative for heartburn, nausea, vomiting, abdominal pain, diarrhea and constipation.  Genitourinary: Negative for dysuria.  Musculoskeletal: Negative for myalgias and falls.  Skin: Negative for rash.  Neurological: Negative for dizziness and headaches.  Psychiatric/Behavioral: Negative for depression.    Objective:  Physical Exam: Filed Vitals:   10/10/12 1445  BP: 136/82  Pulse: 83  Temp: 97.8 F (36.6 C)  TempSrc: Oral  Weight: 264 lb 11.2 oz (120.067 kg)  SpO2: 100%  Physical Exam  Nursing note and vitals reviewed. Constitutional: She is  well-developed, well-nourished, and in no distress. No distress.  HENT:  Head: Normocephalic and atraumatic.  Eyes: Conjunctivae are normal.  Cardiovascular: Normal rate, regular rhythm and normal heart sounds.   No murmur heard. Pulses:      Dorsalis pedis pulses are 1+ on the left side.       Posterior tibial pulses are 1+ on the left side.  Pulmonary/Chest: Effort normal and breath sounds normal. No respiratory distress. She has no wheezes. She has no rales.  Abdominal: Soft. Bowel sounds are normal. She exhibits no distension. There is no tenderness.  Musculoskeletal: She exhibits no edema.  Skin: Skin is warm and dry. She is not diaphoretic.  2cm plantar blister near 1st MTP joint  Psychiatric: Affect and judgment normal.     Assessment & Plan:   See Problem Based Assessment and Plan No orders of the defined types were placed in this encounter.

## 2012-10-10 NOTE — Assessment & Plan Note (Addendum)
Lab Results  Component Value Date   HGBA1C 6.5 08/28/2012   HGBA1C 6.6 06/08/2012   HGBA1C 6.5 02/20/2012     Assessment: Diabetes control: good control (HgbA1C at goal) Progress toward A1C goal:  at goal Comments:   Plan: Medications:  continue current medications Home glucose monitoring: Frequency: once a day Timing: before breakfast Instruction/counseling given: reminded to get eye exam, reminded to bring blood glucose meter & log to each visit, reminded to bring medications to each visit and discussed foot care Educational resources provided: brochure Self management tools provided: copy of home glucose meter download Other plans: Patient has increased exercise and been very compliant with her medication and checking BS once a day.  Has 90% of reading within range and 10% above, no hypo.

## 2012-10-12 NOTE — Progress Notes (Signed)
I saw and evaluated the patient.  I personally confirmed the key portions of the history and exam documented by Dr. Hoffman and I reviewed pertinent patient test results.  The assessment, diagnosis, and plan were formulated together and I agree with the documentation in the resident's note. 

## 2012-10-15 ENCOUNTER — Ambulatory Visit (HOSPITAL_COMMUNITY)
Admission: RE | Admit: 2012-10-15 | Discharge: 2012-10-15 | Disposition: A | Discharge status: Home / Self Care | Payer: Medicare Other | Source: Ambulatory Visit | Attending: Internal Medicine | Admitting: Internal Medicine

## 2012-10-15 DIAGNOSIS — R0989 Other specified symptoms and signs involving the circulatory and respiratory systems: Secondary | ICD-10-CM | POA: Diagnosis not present

## 2012-10-15 DIAGNOSIS — IMO0002 Reserved for concepts with insufficient information to code with codable children: Secondary | ICD-10-CM | POA: Diagnosis not present

## 2012-10-15 DIAGNOSIS — S90822A Blister (nonthermal), left foot, initial encounter: Secondary | ICD-10-CM

## 2012-10-15 DIAGNOSIS — E119 Type 2 diabetes mellitus without complications: Secondary | ICD-10-CM | POA: Insufficient documentation

## 2012-10-15 DIAGNOSIS — X58XXXA Exposure to other specified factors, initial encounter: Secondary | ICD-10-CM | POA: Insufficient documentation

## 2012-10-15 DIAGNOSIS — Z8631 Personal history of diabetic foot ulcer: Secondary | ICD-10-CM

## 2012-10-15 NOTE — Progress Notes (Signed)
VASCULAR LAB PRELIMINARY  ARTERIAL  ABI completed:    RIGHT    LEFT    PRESSURE WAVEFORM  PRESSURE WAVEFORM  BRACHIAL 143 triphasic BRACHIAL 134 triphasic  DP   DP    AT 153 triphasic AT 139 triphasic  PT 152 triphasic PT 141 triphasic  PER   PER    GREAT TOE  NA GREAT TOE  NA    RIGHT LEFT  ABI >1.0 0.99     Meya Clutter, RVT 10/15/2012, 2:01 PM

## 2012-10-18 ENCOUNTER — Ambulatory Visit (HOSPITAL_COMMUNITY)
Admission: RE | Admit: 2012-10-18 | Discharge: 2012-10-18 | Disposition: A | Discharge status: Home / Self Care | Payer: Medicare Other | Source: Ambulatory Visit | Attending: Internal Medicine | Admitting: Internal Medicine

## 2012-10-18 DIAGNOSIS — Z1231 Encounter for screening mammogram for malignant neoplasm of breast: Secondary | ICD-10-CM

## 2012-10-24 ENCOUNTER — Other Ambulatory Visit: Payer: Self-pay | Admitting: Internal Medicine

## 2012-11-01 ENCOUNTER — Ambulatory Visit (INDEPENDENT_AMBULATORY_CARE_PROVIDER_SITE_OTHER): Payer: Medicare Other | Admitting: Podiatry

## 2012-11-01 ENCOUNTER — Encounter: Payer: Self-pay | Admitting: Podiatry

## 2012-11-01 VITALS — BP 145/78 | HR 77 | Resp 16 | Ht 70.5 in | Wt 258.0 lb

## 2012-11-01 DIAGNOSIS — B351 Tinea unguium: Secondary | ICD-10-CM

## 2012-11-01 DIAGNOSIS — M79609 Pain in unspecified limb: Secondary | ICD-10-CM | POA: Diagnosis not present

## 2012-11-01 NOTE — Patient Instructions (Signed)
If you see any drainage, skin opening or odor please contact us

## 2012-11-01 NOTE — Progress Notes (Signed)
  Subjective:    Patient ID: [name], female    DOB: [birth year], 57 y.o.   MRN: [id number] "My doctor wanted me to come and have my feet checked, 'm Diabetic, and see about getting some shoes.   Diabetes She presents for her initial diabetic visit. She has type 2 diabetes mellitus. No MedicAlert identification noted. Her disease course has been improving. Associated symptoms include foot ulcerations and weight loss. Symptoms are resolved.      Review of Systems  Constitutional: Positive for weight loss.       Objective:   Physical Exam        Assessment & Plan:

## 2012-11-01 NOTE — Progress Notes (Signed)
Subjective:     Patient ID: [name], female   DOB: [birth year], 57 y.o.   MRN: [id number]  Diabetes   57 year old diabetic with history of breakdown plantar left foot who is concerned about her future with this disease. Also concerned about her nails and the fact she cannot cut   Review of Systems  All other systems reviewed and are negative.       Objective:   Physical Exam  Nursing note and vitals reviewed. Constitutional: She appears well-developed and well-nourished.  Cardiovascular: Intact distal pulses.   Musculoskeletal: Normal range of motion.  Neurological: She is alert.  Skin: Skin is warm.   Patient has a previous abrasion plantar aspect left that has healed currently. I noted there to be mild neurological loss to sharp dull and vibratory and some collapse of medial longitudinal arch bilateral. Nail disease with thickness 1-5 bilateral and discomfort when pressed    Assessment:     Mycotic nail infection with pain bilateral and diabetes with at risk factors of neurological disease    Plan:     H&P performed and diabetic education performed. Debridement nailbeds 1-5 bilateral with no iatrogenic bleeding noted advised on daily inspections of the feet

## 2012-11-20 ENCOUNTER — Encounter: Payer: Medicare Other | Admitting: Internal Medicine

## 2012-11-27 ENCOUNTER — Encounter: Payer: Medicare Other | Admitting: Internal Medicine

## 2012-11-30 ENCOUNTER — Other Ambulatory Visit: Payer: Self-pay | Admitting: Internal Medicine

## 2012-12-05 ENCOUNTER — Encounter: Payer: Self-pay | Admitting: Internal Medicine

## 2012-12-05 ENCOUNTER — Ambulatory Visit (INDEPENDENT_AMBULATORY_CARE_PROVIDER_SITE_OTHER): Payer: Medicare Other | Admitting: Internal Medicine

## 2012-12-05 VITALS — BP 122/69 | HR 77 | Temp 98.2°F | Ht 69.0 in | Wt 257.1 lb

## 2012-12-05 DIAGNOSIS — M25559 Pain in unspecified hip: Secondary | ICD-10-CM

## 2012-12-05 DIAGNOSIS — E119 Type 2 diabetes mellitus without complications: Secondary | ICD-10-CM

## 2012-12-05 DIAGNOSIS — M25551 Pain in right hip: Secondary | ICD-10-CM

## 2012-12-05 LAB — GLUCOSE, CAPILLARY: Glucose-Capillary: 74 mg/dL (ref 70–99)

## 2012-12-05 LAB — POCT GLYCOSYLATED HEMOGLOBIN (HGB A1C): Hemoglobin A1C: 6.6

## 2012-12-05 NOTE — Patient Instructions (Signed)
We dont think anything worrisome is going on for now. Take Tylenol, the arthritis strenght when you have the pain. You can take it up to 3 times a day.  Please avoid Alieve, Ibuprofen, naproxen, Advil, because it can Hurt the lining of your stomach causing bleeding and Ulcers, it can also cause more damage to your kidneys.

## 2012-12-05 NOTE — Assessment & Plan Note (Signed)
Likely due to Osteoarthritis, but aetiology not exactly clear considering short duration. Will manage conservatively. If pain persist after ~2 months or worsens in severity, consider Hip Xray and lumber xray.  Plan- Tylenol 650mg  TID/prn.

## 2012-12-05 NOTE — Assessment & Plan Note (Signed)
Well controlled. HBA1c today- 6.6. Pt also exercising, trying to loose some weight. Pt brought Glucometer along, average of 123mg /dl. No recorded low blood sugar readings.  Diabetes control: good control (HgbA1C at goal)  Progress toward A1C goal: at goal  Plan- Continue current regimen- Sitagliptin- 100mg  dly, And metformin- 1000mg  dly.

## 2012-12-05 NOTE — Progress Notes (Signed)
Patient ID: [name], female   DOB: [birth year], 57 y.o.   MRN: [id number]   Subjective:   Patient ID: [name] female   DOB: 1955/11/12 57 y.o.   MRN: [id number]  HPI: [name] is a 57 y.o. medical history of hypertension, OSA, foot ulcer, diabetes mellitus type 2.   Presented with hip pains for the past week, progressively getting worse in the past 2 days.  Patient reports pain is mostly in posterior hip, and moves down the side of her leg to her calf, but no knee pain, no back pain, no leg cramps, no parasthesias. Does report increase activity lately- jogging, to assist with her weightloss. It is worse in the mornings, with some mild the stiffness but gets better with movement and exercises. No falls, no fever, no other pain in other joint, no rash.  Has been taking tylenol for the pain- Athritis strength- 650mg  twice a day. Which significantly helps with the hip pain.  Has had similar pain in the past, about 2 years ago, which resolved spontaneously.  Past Medical History  Diagnosis Date  . Normocytic anemia   . Hypertension   . Diabetes mellitus   . Foot ulcer, left   . Leg edema   . Glaucoma   . GERD (gastroesophageal reflux disease)    Current Outpatient Prescriptions  Medication Sig Dispense Refill  . acetaminophen (TYLENOL) 325 MG tablet Take 1 tablet (325 mg total) by mouth every 6 (six) hours as needed. For back pain.  30 tablet    . aspirin EC 81 MG tablet Take 81 mg by mouth daily.      . Calcium Carbonate-Vitamin D 600-400 MG-UNIT per chew tablet Chew 1 tablet by mouth 2 (two) times daily.  30 tablet  11  . ferrous sulfate 325 (65 FE) MG tablet Take 325 mg by mouth daily. With orange juice      . glucose blood (ACCU-CHEK AVIVA PLUS) test strip Use as directed for once daily glucose testing. Dx: ICD-9 250.00.  100 each  1  . lisinopril (PRINIVIL,ZESTRIL) 20 MG tablet TAKE 1 TABLET BY MOUTH DAILY  30 tablet  0  . metFORMIN (GLUCOPHAGE) 1000 MG tablet  Take 1,000 mg by mouth 2 (two) times daily with a meal.      . Multiple Vitamin (MULTIVITAMIN) tablet Take 1 tablet by mouth daily.       . pantoprazole (PROTONIX) 40 MG tablet TAKE 1 TABLET BY MOUTH DAILY  90 tablet  1  . sitaGLIPtin (JANUVIA) 100 MG tablet Take 1 tablet (100 mg total) by mouth daily.  90 tablet  1  . [DISCONTINUED] pravastatin (PRAVACHOL) 20 MG tablet Take 1 tablet (20 mg total) by mouth daily.  30 tablet  6   No current facility-administered medications for this visit.   Family History  Problem Relation Age of Onset  . Diabetes Mother   . Hypertension Mother   . Heart disease Father   . Stroke Father   . Diabetes Father   . Hypertension Father   . Gout Father   . Deep vein thrombosis Father    History   Social History  . Marital Status: Single    Spouse Name: N/A    Number of Children: N/A  . Years of Education: N/A   Social History Main Topics  . Smoking status: Former Smoker    Types: Cigarettes    Quit date: 12/22/1972  . Smokeless tobacco: None  . Alcohol Use: No  .  Drug Use: No  . Sexual Activity: No   Other Topics Concern  . 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   Smoked when she was a teenager   no alcohol   no Drugs      Financial assistance approved for 100% discount at [facility] and has [facility] card per Rudell Cobb   10/30/2009      She was just approved for Disability and is looking for an income controlled housing so she can move out of the shelter.  Living in a rent controlled apartment.    Review of Systems: No pertinent findings on review of systems .  Objective:  Physical Exam: Filed Vitals:   12/05/12 1433  BP: 122/69  Pulse: 77  Temp: 98.2 F (36.8 C)  TempSrc: Oral  Height: 5\' 9"  (1.753 m)  Weight: 257 lb 1.6 oz (116.62 kg)  SpO2: 98%   GENERAL- alert, co-operative, appears as stated age, not in any distress. HEENT- Atraumatic, normocephalic, PERRL,  EOMI, oral mucosa appears moist, no cervical LN enlargement, thyroid does not appear enlarged. CARDIAC- RRR, no murmurs, rubs or gallops. RESP- Moving equal volumes of air, and clear to auscultation bilaterally. ABDOMEN- Soft, nontender, no palpable masses or organomegaly, bowel sounds present. BACK- Normal curvature of the spine, No tenderness along the vertebrae, no CVA tenderness. NEURO- Cr N 2-12 intact, strenght equal and present in all extremities. Gait- a bit halted as pt has a brace on the Lt leg, present for about a year, since she had the foot ulcer, to help with reducing pressure on the foot where she had the ulcer, but can bear weight on Rt leg. EXTREMITIES- Rt lower extr- ROM- Internal, and external rotation intact, with minimal pain on movement, flexion- normal, normal sensation. Lt LE- Normal. pulse 2+, symmetric, no pedal edema. SKIN- Warm, dry, No rash or lesion. PSYCH- Normal mood and affect, appropriate thought content and speech.  Assessment & Plan:   The patient's case and plan of care was discussed with attending physician, Dr. Irine Seal.  Please see problem based chatting for Assessment and Plan.

## 2012-12-06 NOTE — Progress Notes (Signed)
I saw and evaluated the patient.  I personally confirmed the key portions of the history and exam documented by Dr. Emokpae and I reviewed pertinent patient test results.  The assessment, diagnosis, and plan were formulated together and I agree with the documentation in the resident's note. 

## 2012-12-31 ENCOUNTER — Other Ambulatory Visit: Payer: Self-pay | Admitting: Internal Medicine

## 2013-01-03 ENCOUNTER — Other Ambulatory Visit: Payer: Self-pay | Admitting: Internal Medicine

## 2013-01-11 ENCOUNTER — Other Ambulatory Visit: Payer: Self-pay | Admitting: Internal Medicine

## 2013-01-22 ENCOUNTER — Encounter: Payer: Medicare Other | Admitting: Internal Medicine

## 2013-01-30 ENCOUNTER — Observation Stay (HOSPITAL_COMMUNITY)
Admission: AD | Admit: 2013-01-30 | Discharge: 2013-02-02 | Disposition: A | Discharge status: Home / Self Care | Payer: Medicare Other | Source: Ambulatory Visit | Attending: Internal Medicine | Admitting: Internal Medicine

## 2013-01-30 ENCOUNTER — Observation Stay (HOSPITAL_COMMUNITY): Payer: Medicare Other

## 2013-01-30 ENCOUNTER — Encounter: Payer: Self-pay | Admitting: Dietician

## 2013-01-30 ENCOUNTER — Ambulatory Visit (INDEPENDENT_AMBULATORY_CARE_PROVIDER_SITE_OTHER): Payer: Medicare Other | Admitting: Internal Medicine

## 2013-01-30 ENCOUNTER — Encounter (HOSPITAL_COMMUNITY): Payer: Self-pay | Admitting: General Practice

## 2013-01-30 ENCOUNTER — Encounter: Payer: Self-pay | Admitting: Internal Medicine

## 2013-01-30 VITALS — BP 133/78 | HR 77 | Temp 98.3°F | Ht 69.0 in | Wt 260.8 lb

## 2013-01-30 DIAGNOSIS — L97509 Non-pressure chronic ulcer of other part of unspecified foot with unspecified severity: Secondary | ICD-10-CM | POA: Diagnosis not present

## 2013-01-30 DIAGNOSIS — G4733 Obstructive sleep apnea (adult) (pediatric): Secondary | ICD-10-CM | POA: Diagnosis present

## 2013-01-30 DIAGNOSIS — E7800 Pure hypercholesterolemia, unspecified: Secondary | ICD-10-CM | POA: Insufficient documentation

## 2013-01-30 DIAGNOSIS — D509 Iron deficiency anemia, unspecified: Secondary | ICD-10-CM | POA: Diagnosis present

## 2013-01-30 DIAGNOSIS — E119 Type 2 diabetes mellitus without complications: Secondary | ICD-10-CM | POA: Diagnosis not present

## 2013-01-30 DIAGNOSIS — K219 Gastro-esophageal reflux disease without esophagitis: Secondary | ICD-10-CM | POA: Diagnosis not present

## 2013-01-30 DIAGNOSIS — E1122 Type 2 diabetes mellitus with diabetic chronic kidney disease: Secondary | ICD-10-CM | POA: Diagnosis present

## 2013-01-30 DIAGNOSIS — I10 Essential (primary) hypertension: Secondary | ICD-10-CM | POA: Diagnosis present

## 2013-01-30 DIAGNOSIS — N183 Chronic kidney disease, stage 3 unspecified: Secondary | ICD-10-CM | POA: Insufficient documentation

## 2013-01-30 DIAGNOSIS — I129 Hypertensive chronic kidney disease with stage 1 through stage 4 chronic kidney disease, or unspecified chronic kidney disease: Secondary | ICD-10-CM | POA: Insufficient documentation

## 2013-01-30 DIAGNOSIS — E118 Type 2 diabetes mellitus with unspecified complications: Secondary | ICD-10-CM | POA: Diagnosis present

## 2013-01-30 DIAGNOSIS — L97529 Non-pressure chronic ulcer of other part of left foot with unspecified severity: Secondary | ICD-10-CM | POA: Diagnosis present

## 2013-01-30 DIAGNOSIS — D649 Anemia, unspecified: Secondary | ICD-10-CM | POA: Diagnosis present

## 2013-01-30 DIAGNOSIS — Z87891 Personal history of nicotine dependence: Secondary | ICD-10-CM | POA: Diagnosis not present

## 2013-01-30 DIAGNOSIS — E1169 Type 2 diabetes mellitus with other specified complication: Secondary | ICD-10-CM | POA: Diagnosis not present

## 2013-01-30 DIAGNOSIS — L97409 Non-pressure chronic ulcer of unspecified heel and midfoot with unspecified severity: Principal | ICD-10-CM | POA: Insufficient documentation

## 2013-01-30 HISTORY — DX: Personal history of other diseases of the digestive system: Z87.19

## 2013-01-30 HISTORY — DX: Unspecified osteoarthritis, unspecified site: M19.90

## 2013-01-30 LAB — CBC WITH DIFFERENTIAL/PLATELET
BASOS ABS: 0 10*3/uL (ref 0.0–0.1)
Basophils Relative: 0 % (ref 0–1)
Eosinophils Absolute: 0.2 10*3/uL (ref 0.0–0.7)
Eosinophils Relative: 3 % (ref 0–5)
HCT: 30.8 % — ABNORMAL LOW (ref 36.0–46.0)
Hemoglobin: 10.2 g/dL — ABNORMAL LOW (ref 12.0–15.0)
Lymphocytes Relative: 31 % (ref 12–46)
Lymphs Abs: 2.5 10*3/uL (ref 0.7–4.0)
MCH: 27.7 pg (ref 26.0–34.0)
MCHC: 33.1 g/dL (ref 30.0–36.0)
MCV: 83.7 fL (ref 78.0–100.0)
Monocytes Absolute: 0.6 10*3/uL (ref 0.1–1.0)
Monocytes Relative: 7 % (ref 3–12)
NEUTROS ABS: 4.8 10*3/uL (ref 1.7–7.7)
NEUTROS PCT: 59 % (ref 43–77)
PLATELETS: 265 10*3/uL (ref 150–400)
RBC: 3.68 MIL/uL — ABNORMAL LOW (ref 3.87–5.11)
RDW: 15.9 % — AB (ref 11.5–15.5)
WBC: 8.1 10*3/uL (ref 4.0–10.5)

## 2013-01-30 LAB — COMPREHENSIVE METABOLIC PANEL
ALK PHOS: 57 U/L (ref 39–117)
ALT: 7 U/L (ref 0–35)
AST: 16 U/L (ref 0–37)
Albumin: 3.9 g/dL (ref 3.5–5.2)
BUN: 31 mg/dL — ABNORMAL HIGH (ref 6–23)
CO2: 25 mEq/L (ref 19–32)
Calcium: 10.1 mg/dL (ref 8.4–10.5)
Chloride: 99 mEq/L (ref 96–112)
Creatinine, Ser: 1.54 mg/dL — ABNORMAL HIGH (ref 0.50–1.10)
GFR calc non Af Amer: 36 mL/min — ABNORMAL LOW (ref >90)
GFR, EST AFRICAN AMERICAN: 42 mL/min — AB (ref >90)
Glucose, Bld: 89 mg/dL (ref 70–99)
Potassium: 4.1 mEq/L (ref 3.7–5.3)
SODIUM: 138 meq/L (ref 137–147)
Total Protein: 8 g/dL (ref 6.0–8.3)

## 2013-01-30 LAB — GLUCOSE, CAPILLARY
Glucose-Capillary: 85 mg/dL (ref 70–99)
Glucose-Capillary: 108 mg/dL — ABNORMAL HIGH (ref 70–99)
Glucose-Capillary: 144 mg/dL — ABNORMAL HIGH (ref 70–99)

## 2013-01-30 LAB — SEDIMENTATION RATE: Sed Rate: 54 mm/hr — ABNORMAL HIGH (ref 0–22)

## 2013-01-30 MED ORDER — ADULT MULTIVITAMIN W/MINERALS CH
1.0000 | ORAL_TABLET | Freq: Every day | ORAL | Status: DC
  Administered 2013-01-30 – 2013-02-02 (×4): 1 via ORAL
  Filled 2013-01-30 (×4): qty 1

## 2013-01-30 MED ORDER — ONE-DAILY MULTI VITAMINS PO TABS: 1.0000 | ORAL_TABLET | Freq: Every day | ORAL | Status: DC

## 2013-01-30 MED ORDER — FERROUS SULFATE 325 (65 FE) MG PO TABS
325.0000 mg | ORAL_TABLET | Freq: Every day | ORAL | Status: DC
  Administered 2013-01-31 – 2013-02-02 (×3): 325 mg via ORAL
  Filled 2013-01-30 (×3): qty 1

## 2013-01-30 MED ORDER — LISINOPRIL 20 MG PO TABS
20.0000 mg | ORAL_TABLET | Freq: Every day | ORAL | Status: DC
  Administered 2013-01-31 – 2013-02-02 (×3): 20 mg via ORAL
  Filled 2013-01-30 (×3): qty 1

## 2013-01-30 MED ORDER — LATANOPROST 0.005 % OP SOLN
1.0000 [drp] | Freq: Every day | OPHTHALMIC | Status: DC
  Administered 2013-01-30 – 2013-02-01 (×3): 1 [drp] via OPHTHALMIC
  Filled 2013-01-30: qty 2.5

## 2013-01-30 MED ORDER — HEPARIN SODIUM (PORCINE) 5000 UNIT/ML IJ SOLN
5000.0000 [IU] | Freq: Three times a day (TID) | INTRAMUSCULAR | Status: DC
  Administered 2013-01-30 – 2013-02-02 (×9): 5000 [IU] via SUBCUTANEOUS
  Filled 2013-01-30 (×11): qty 1

## 2013-01-30 MED ORDER — PANTOPRAZOLE SODIUM 40 MG PO TBEC
40.0000 mg | DELAYED_RELEASE_TABLET | Freq: Every day | ORAL | Status: DC
  Administered 2013-01-30: 40 mg via ORAL

## 2013-01-30 MED ORDER — PANTOPRAZOLE SODIUM 40 MG PO TBEC
40.0000 mg | DELAYED_RELEASE_TABLET | Freq: Every day | ORAL | Status: DC
  Administered 2013-02-01: 40 mg via ORAL
  Filled 2013-01-30: qty 1

## 2013-01-30 MED ORDER — FAMOTIDINE 20 MG PO TABS
20.0000 mg | ORAL_TABLET | Freq: Once | ORAL | Status: AC
  Administered 2013-01-30: 20 mg via ORAL
  Filled 2013-01-30: qty 1

## 2013-01-30 MED ORDER — FUROSEMIDE 40 MG PO TABS
40.0000 mg | ORAL_TABLET | Freq: Every day | ORAL | Status: DC
  Administered 2013-01-31 – 2013-02-02 (×3): 40 mg via ORAL
  Filled 2013-01-30 (×3): qty 1

## 2013-01-30 MED ORDER — ASPIRIN EC 81 MG PO TBEC
81.0000 mg | DELAYED_RELEASE_TABLET | Freq: Every day | ORAL | Status: DC
  Administered 2013-01-31 – 2013-02-02 (×3): 81 mg via ORAL
  Filled 2013-01-30 (×3): qty 1

## 2013-01-30 NOTE — H&P (Signed)
Date: 01/30/2013               Patient Name:  [name] MRN: [id number]  DOB: Nov 19, 1955 Age / Sex: 58 y.o., female   PCP: Marrion Coy, MD         Medical Service: Internal Medicine Teaching Service         Attending Physician: Dr. Sid Falcon, MD    First Contact: Dr. Naaman Plummer Pager: [phone]  Second Contact: Dr. Eula Fried  Pager: [phone]       After Hours (After 5p/  First Contact Pager: [phone]  weekends / holidays): Second Contact Pager: [phone]   Chief Complaint: left wound ulcer draining    History of Present Illness:   [name] is a 59 year old woman with past medical history non-insulin dependent Type II DM (last HbA1c 6.6 on 12/05/12), diabetic foot ulcer with debridement in 2011, CKD  OSA on CPAP, iron-deficiency anemia on iron supplements, and GERD who presents with left plantar wound ulcer with bloody drainage for 1 week.   Pt reports having diabetic left foot plantar ulcer consistent with cellulitis with posterior compartment infectious myositis, requiring surgical debridement 01/09/10 and long course of PO antibiotics with frequent wound care and hyperbaric chamber therapy. She denies past history of osteomyelitis.  She reports that her ulcer completely resolved and no longer needed wound care since 2013 with no problems until approximately 1 month ago when she noticed a black appearing wound in the same location with drainage of small amount of blood about 1 week ago. She denies recent trauma, injury, or falls. She also denies fever, chills, pain, erythema, edema, purulent drainage, paresthesias, or weakness. She has been cleaning and applying topical agent consisting of honey to the wound. She is able to walk but trying to avoid full weight on the foot due to drainage more than pain.  Her last HbA1c was 6.6 on 12/05/12 and reports being compliant with metformin and sitagliptin use. She reports hypoglycemia episode yesterday and having blurred vision  and neuropathy at baseline.     Meds:  No current facility-administered medications on file prior to encounter.   Current Outpatient Prescriptions on File Prior to Encounter  Medication Sig Dispense Refill  . acetaminophen (TYLENOL) 325 MG tablet Take 1 tablet (325 mg total) by mouth every 6 (six) hours as needed. For back pain.  30 tablet    . aspirin EC 81 MG tablet Take 81 mg by mouth daily.      . Calcium Carbonate-Vitamin D 600-400 MG-UNIT per chew tablet Chew 1 tablet by mouth 2 (two) times daily.  30 tablet  11  . ferrous sulfate 325 (65 FE) MG tablet Take 325 mg by mouth daily. With orange juice      . glucose blood (ACCU-CHEK AVIVA PLUS) test strip Use as directed for once daily glucose testing. Dx: ICD-9 250.00.  100 each  1  . JANUVIA 100 MG tablet TAKE 1 TABLET BY MOUTH DAILY  90 tablet  0  . lisinopril (PRINIVIL,ZESTRIL) 20 MG tablet TAKE 1 TABLET BY MOUTH DAILY  30 tablet  6  . metFORMIN (GLUCOPHAGE) 1000 MG tablet Take 1,000 mg by mouth 2 (two) times daily with a meal.      . Multiple Vitamin (MULTIVITAMIN) tablet Take 1 tablet by mouth daily.       . pantoprazole (PROTONIX) 40 MG tablet TAKE 1 TABLET BY MOUTH DAILY  90 tablet  3  . pantoprazole (PROTONIX) 40 MG tablet TAKE 1  TABLET BY MOUTH DAILY  90 tablet  0  . [DISCONTINUED] pravastatin (PRAVACHOL) 20 MG tablet Take 1 tablet (20 mg total) by mouth daily.  30 tablet  6    Current Facility-Administered Medications  Medication Dose Route Frequency Provider Last Rate Last Dose  . [START ON 01/31/2013] aspirin EC tablet 81 mg  81 mg Oral Daily Jerene Pitch, MD      . Derrill Memo ON 01/31/2013] ferrous sulfate tablet 325 mg  325 mg Oral Daily Jerene Pitch, MD      . heparin injection 5,000 Units  5,000 Units Subcutaneous Q8H Jerene Pitch, MD      . Derrill Memo ON 01/31/2013] lisinopril (PRINIVIL,ZESTRIL) tablet 20 mg  20 mg Oral Daily Jerene Pitch, MD      . multivitamin with minerals tablet 1 tablet  1 tablet Oral Daily Sid Falcon, MD   1 tablet at 01/30/13 1752  . pantoprazole (PROTONIX) EC tablet 40 mg  40 mg Oral Daily Jerene Pitch, MD   40 mg at 01/30/13 1800    Allergies: Allergies as of 01/30/2013 - Review Complete 01/30/2013  Allergen Reaction Noted  . Codeine  06/17/2009  . Pravastatin Other (See Comments) 03/09/2012   Past Medical History  Diagnosis Date  . Normocytic anemia   . Hypertension   . Diabetes mellitus   . Foot ulcer, left   . Leg edema   . Glaucoma   . GERD (gastroesophageal reflux disease)    Past Surgical History  Procedure Laterality Date  . Oophrectomy  1975  . Appendectomy    . Foot surgery Left   . Multiple tooth extractions Bilateral    Family History  Problem Relation Age of Onset  . Diabetes Mother   . Hypertension Mother   . Heart disease Father   . Stroke Father   . Diabetes Father   . Hypertension Father   . Gout Father   . Deep vein thrombosis Father    History   Social History  . Marital Status: Single    Spouse Name: N/A    Number of Children: N/A  . Years of Education: N/A   Occupational History  . Not on file.   Social History Main Topics  . Smoking status: Former Smoker    Types: Cigarettes    Quit date: 12/22/1972  . Smokeless tobacco: Not on file  . Alcohol Use: No  . Drug Use: No  . Sexual Activity: No   Other Topics Concern  . 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   Smoked when she was a teenager   no alcohol   no Drugs      Financial assistance approved for 100% discount at [facility] and has [facility] card per Bonna Gains   10/30/2009      She was just approved for Disability and is looking for an income controlled housing so she can move out of the shelter.  Living in a rent controlled apartment.     Review of Systems: Review of Systems  Constitutional: Positive for weight loss (intentional) and malaise/fatigue. Negative for fever and chills.    HENT: Negative for congestion and sore throat.   Eyes: Positive for blurred vision (chronic).  Respiratory: Positive for cough (dry). Negative for shortness of breath.   Cardiovascular: Negative for chest pain and palpitations.  Gastrointestinal: Negative for heartburn, nausea, vomiting, abdominal pain, diarrhea, constipation and blood  in stool.  Genitourinary: Negative for dysuria, urgency, frequency and hematuria.  Musculoskeletal: Negative for falls and myalgias.  Neurological: Positive for sensory change (chronic neuropathy (feet) ). Negative for dizziness, weakness and headaches.    Physical Exam: Blood pressure 141/63, pulse 78, temperature 98.4 F (36.9 C), resp. rate 20, height _0  (1.753 m), weight 113.5 kg (250 lb 3.6 oz), SpO2 100.00%. Physical Exam  Constitutional: She is oriented to person, place, and time. She appears well-developed and well-nourished. No distress.  HENT:  Head: Normocephalic and atraumatic.  Right Ear: External ear normal.  Left Ear: External ear normal.  Nose: Nose normal.  Mouth/Throat: Oropharynx is clear and moist. No oropharyngeal exudate.  Eyes: Conjunctivae and EOM are normal. Pupils are equal, round, and reactive to light. Right eye exhibits no discharge. Left eye exhibits no discharge.  Neck: Normal range of motion. Neck supple.  Cardiovascular: Normal rate, regular rhythm and normal heart sounds.   Pulmonary/Chest: Effort normal and breath sounds normal. No respiratory distress. She has no wheezes. She has no rales.  Left chest wall hernia   Abdominal: Soft. Bowel sounds are normal. She exhibits no distension. There is no tenderness. There is no rebound and no guarding.  obese  Musculoskeletal: She exhibits no edema and no tenderness.  Neurological: She is alert and oriented to person, place, and time. No cranial nerve deficit.  Skin: Skin is warm and dry. No rash noted. She is not diaphoretic. No erythema. No pallor.  Left plantar eschar 5  cm wide with 0.5 cm ulceration with no overlying erythema, warmth, or drainage. DP and PT pulses  intact.      Psychiatric: She has a normal mood and affect. Her behavior is normal. Judgment and thought content normal.     Lab results: Basic Metabolic Panel: No results found for this basename: NA, K, CL, CO2, GLUCOSE, BUN, CREATININE, CALCIUM, MG, PHOS,  in the last 72 hours Liver Function Tests: No results found for this basename: AST, ALT, ALKPHOS, BILITOT, PROT, ALBUMIN,  in the last 72 hours No results found for this basename: LIPASE, AMYLASE,  in the last 72 hours No results found for this basename: AMMONIA,  in the last 72 hours CBC: No results found for this basename: WBC, NEUTROABS, HGB, HCT, MCV, PLT,  in the last 72 hours Cardiac Enzymes: No results found for this basename: CKTOTAL, CKMB, CKMBINDEX, TROPONINI,  in the last 72 hours BNP: No results found for this basename: PROBNP,  in the last 72 hours D-Dimer: No results found for this basename: DDIMER,  in the last 72 hours CBG:  Recent Labs  01/30/13 1523 01/30/13 1625  GLUCAP 85 108*   Hemoglobin A1C: No results found for this basename: HGBA1C,  in the last 72 hours Fasting Lipid Panel: No results found for this basename: CHOL, HDL, LDLCALC, TRIG, CHOLHDL, LDLDIRECT,  in the last 72 hours Thyroid Function Tests: No results found for this basename: TSH, T4TOTAL, FREET4, T3FREE, THYROIDAB,  in the last 72 hours Anemia Panel: No results found for this basename: VITAMINB12, FOLATE, FERRITIN, TIBC, IRON, RETICCTPCT,  in the last 72 hours Coagulation: No results found for this basename: LABPROT, INR,  in the last 72 hours Urine Drug Screen: Drugs of Abuse  No results found for this basename: labopia, cocainscrnur, labbenz, amphetmu, thcu, labbarb    Alcohol Level: No results found for this basename: ETH,  in the last 72 hours Urinalysis: No results found for this basename: COLORURINE, APPERANCEUR, LABSPEC, [place],  GLUCOSEU, HGBUR, BILIRUBINUR, KETONESUR, PROTEINUR, UROBILINOGEN, NITRITE, LEUKOCYTESUR,  in the last 72 hours   Imaging results:  No results found.  Other results: EKG: none   Assessment:  58 year old woman with past medical history non-insulin dependent Type II DM (last HbA1c 6.6 on 12/05/12) who presents with recurrent left plantar wound ulcer with bloody drainage for 1 week.   Plan:   Recurrent Left Plantar Wound Ulcer - Pt with eschar pressure wound ulcer in setting of controlled T2DM with no signs of infection to suggest osteomyelitis or vascular compromise. Serosanguinous drainage most likely due to continued weight bearing.       -Obtain XR left foot, consider MRI left foot to r/o osteomyelitis and abscess -Obtain wound care consult -Obtain wound culture  -Consider surgery consult for debridement, most likely outpatient f/u  -Obtain CBC w/diff,  ESR  -Neurovascular checks Q4hr -Hold antibiotics at this time as wound does not appear infected -Limit weight-bearing, will need crutches, diabetic shoes  Non-insulin dependent Type II Diabetes Mellitus - CBG 108.  Last HbA1c of 6.6 on 12/05/12. Pt compliant with metformin 1000 mg BID and sitagliptin 100 mg daily. -Hold home metformin 1000 mg BID -Hold home sitagliptin 100 mg daily  -Obtain HbA1c -Monitor glucose frequently -Consider insulin sliding scale if uncontrolled   Hypertension - currently normotensive -Continue home lisinopril 20 mg daily  -Continue home furosemide 40 mg daily   Chronic Kidney Disease Stage III - Baseline Cr 1.4 (?) -Obtain CMP -Avoid nephrotoxins   Iron-deficiency Anemia - currently stable with no active bleeding. Baseline Hg 10 -Obtain CBC  -Continue home ferrous sulfate 325 mg daily -Monitor for bleeding   GERD - currently without acid reflux symptoms  -Continue home pantoprazole 40 mg daily   OSA - Pt on CPAP mask at home -Continue CPAP as tolerated PRN at night  Diet: Carb modified DVT  Ppx: SQ Heparin TID  Code: Full     Dispo: Disposition is deferred at this time, awaiting improvement of current medical problems. Anticipated discharge in approximately 1-2 day(s).   The patient does have a current PCP Marrion Coy, MD) and does need an [facility] hospital follow-up appointment after discharge.  The patient does not have transportation limitations that hinder transportation to clinic appointments.  Signed: Juluis Mire, MD 01/30/2013, 6:00 PM

## 2013-01-30 NOTE — Progress Notes (Signed)
Subjective:   Patient ID: [name] female   DOB: [birth year] 58 y.o.   MRN: [id number]  HPI: [name] is a 58 y.o. woman who presents with a cc of left foot wound. The patient has a history of DFU necessitating surgical debridement for an abscess (Jan 09 2010). She presents today with a cc of bloody drainage from the bottom of her foot. She denies fevers, chills, nausea, vomiting. She admits to mild lethargy and malaise.    Past Medical History  Diagnosis Date  . Normocytic anemia   . Hypertension   . Diabetes mellitus   . Foot ulcer, left   . Leg edema   . Glaucoma   . GERD (gastroesophageal reflux disease)    Current Outpatient Prescriptions  Medication Sig Dispense Refill  . acetaminophen (TYLENOL) 325 MG tablet Take 1 tablet (325 mg total) by mouth every 6 (six) hours as needed. For back pain.  30 tablet    . aspirin EC 81 MG tablet Take 81 mg by mouth daily.      . Calcium Carbonate-Vitamin D 600-400 MG-UNIT per chew tablet Chew 1 tablet by mouth 2 (two) times daily.  30 tablet  11  . ferrous sulfate 325 (65 FE) MG tablet Take 325 mg by mouth daily. With orange juice      . glucose blood (ACCU-CHEK AVIVA PLUS) test strip Use as directed for once daily glucose testing. Dx: ICD-9 250.00.  100 each  1  . JANUVIA 100 MG tablet TAKE 1 TABLET BY MOUTH DAILY  90 tablet  0  . lisinopril (PRINIVIL,ZESTRIL) 20 MG tablet TAKE 1 TABLET BY MOUTH DAILY  30 tablet  6  . metFORMIN (GLUCOPHAGE) 1000 MG tablet Take 1,000 mg by mouth 2 (two) times daily with a meal.      . Multiple Vitamin (MULTIVITAMIN) tablet Take 1 tablet by mouth daily.       . pantoprazole (PROTONIX) 40 MG tablet TAKE 1 TABLET BY MOUTH DAILY  90 tablet  3  . pantoprazole (PROTONIX) 40 MG tablet TAKE 1 TABLET BY MOUTH DAILY  90 tablet  0  . [DISCONTINUED] pravastatin (PRAVACHOL) 20 MG tablet Take 1 tablet (20 mg total) by mouth daily.  30 tablet  6   No current facility-administered medications for this  visit.   Family History  Problem Relation Age of Onset  . Diabetes Mother   . Hypertension Mother   . Heart disease Father   . Stroke Father   . Diabetes Father   . Hypertension Father   . Gout Father   . Deep vein thrombosis Father    History   Social History  . Marital Status: Single    Spouse Name: N/A    Number of Children: N/A  . Years of Education: N/A   Social History Main Topics  . Smoking status: Former Smoker    Types: Cigarettes    Quit date: 12/22/1972  . Smokeless tobacco: Not on file  . Alcohol Use: No  . Drug Use: No  . Sexual Activity: No   Other Topics Concern  . 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   Smoked when she was a teenager   no alcohol   no Drugs      Financial assistance approved for 100% discount at [facility] and has [facility] card per [employer]   10/30/2009  She was just approved for Disability and is looking for an income controlled housing so she can move out of the shelter.  Living in a rent controlled apartment.    Review of Systems: Review of Systems  Constitutional: Positive for malaise/fatigue. Negative for fever, chills and diaphoresis.  HENT: Negative for congestion and sore throat.   Respiratory: Negative for cough, sputum production and shortness of breath.   Cardiovascular: Negative for chest pain, palpitations and orthopnea.  Gastrointestinal: Negative for heartburn, nausea, vomiting, abdominal pain, diarrhea and constipation.  Genitourinary: Negative for dysuria, urgency and frequency.  Musculoskeletal:       Foot wound  Neurological: Negative for dizziness, tingling, tremors, focal weakness, weakness and headaches.    Objective:  Physical Exam: Filed Vitals:   01/30/13 1339  BP: 133/78  Pulse: 77  Temp: 98.3 F (36.8 C)  TempSrc: Oral  Height: 5\' 9"  (1.753 m)  Weight: 260 lb 12.8 oz (118.298 kg)  SpO2: 98%   Physical Exam    Constitutional: She is oriented to person, place, and time. She appears well-developed and well-nourished. No distress.  HENT:  Head: Normocephalic.  Mouth/Throat: Oropharynx is clear and moist. No oropharyngeal exudate.  Cardiovascular: Normal rate, regular rhythm, normal heart sounds and intact distal pulses.  Exam reveals no gallop and no friction rub.   No murmur heard. Pulmonary/Chest: Effort normal and breath sounds normal. She has no wheezes. She has no rales.  Neurological: She is alert and oriented to person, place, and time.  Skin: She is not diaphoretic.  The patient has a large eschar on the bottom of her left foot that is detailed in the attached picture.  Psychiatric: She has a normal mood and affect. Her behavior is normal.        Assessment & Plan:   Diabetic Foot Ulcer The patient has a DFU that may be complicated by early infection given increasing drainage over the last 7 days. The patient has stable vital signs, thus she does not appear septic at this time. Plan to admit patient for observation, imaging, referral to general surgery for likely I and D, and antibiotics. I have consulted the internal medicine inpatient team who will evaluate the patient.

## 2013-01-30 NOTE — Progress Notes (Signed)
Report called to RN on 6E.  

## 2013-01-31 ENCOUNTER — Ambulatory Visit: Payer: Medicare Other | Admitting: Podiatry

## 2013-01-31 ENCOUNTER — Observation Stay (HOSPITAL_COMMUNITY): Payer: Medicare Other

## 2013-01-31 DIAGNOSIS — L97509 Non-pressure chronic ulcer of other part of unspecified foot with unspecified severity: Secondary | ICD-10-CM | POA: Diagnosis not present

## 2013-01-31 DIAGNOSIS — K219 Gastro-esophageal reflux disease without esophagitis: Secondary | ICD-10-CM

## 2013-01-31 DIAGNOSIS — D509 Iron deficiency anemia, unspecified: Secondary | ICD-10-CM

## 2013-01-31 DIAGNOSIS — N183 Chronic kidney disease, stage 3 unspecified: Secondary | ICD-10-CM | POA: Diagnosis not present

## 2013-01-31 DIAGNOSIS — G4733 Obstructive sleep apnea (adult) (pediatric): Secondary | ICD-10-CM

## 2013-01-31 DIAGNOSIS — E119 Type 2 diabetes mellitus without complications: Secondary | ICD-10-CM | POA: Diagnosis not present

## 2013-01-31 DIAGNOSIS — L02619 Cutaneous abscess of unspecified foot: Secondary | ICD-10-CM | POA: Diagnosis not present

## 2013-01-31 DIAGNOSIS — I129 Hypertensive chronic kidney disease with stage 1 through stage 4 chronic kidney disease, or unspecified chronic kidney disease: Secondary | ICD-10-CM | POA: Diagnosis not present

## 2013-01-31 LAB — HEMOGLOBIN A1C
HEMOGLOBIN A1C: 6.8 % — AB (ref <5.7)
MEAN PLASMA GLUCOSE: 148 mg/dL — AB (ref <117)

## 2013-01-31 LAB — GLUCOSE, CAPILLARY
Glucose-Capillary: 84 mg/dL (ref 70–99)
Glucose-Capillary: 114 mg/dL — ABNORMAL HIGH (ref 70–99)

## 2013-01-31 MED ORDER — ACETAMINOPHEN 500 MG PO TABS
500.0000 mg | ORAL_TABLET | Freq: Once | ORAL | Status: AC
  Administered 2013-01-31: 500 mg via ORAL
  Filled 2013-01-31: qty 1

## 2013-01-31 MED ORDER — ACETAMINOPHEN 500 MG PO TABS
500.0000 mg | ORAL_TABLET | Freq: Once | ORAL | Status: AC
  Administered 2013-02-01: 500 mg via ORAL
  Filled 2013-01-31: qty 1

## 2013-01-31 MED ORDER — GADOBENATE DIMEGLUMINE 529 MG/ML IV SOLN
10.0000 mL | Freq: Once | INTRAVENOUS | Status: AC
  Administered 2013-01-31: 10 mL via INTRAVENOUS

## 2013-01-31 NOTE — H&P (Signed)
  Date: 01/31/2013  Patient name: ALAYZHA AN  Medical record number: [id number]  Date of birth: 01-Apr-1955   I have seen and evaluated [name] and discussed their care with the Residency Team.  Ms. Westergren is a 58yo woman with PMH of NIDDM, diabetic foot ulcer in 2011, CKD, OSA, anemia, GERD who presents with recurrent left plantar ulcer with bloody drainage X 1 week.  She noticed the wound started about 1 month ago and more recently has developed a black eschar and drainage.  Associated symptoms include fatigue and "feeling sick" per the patient.   Assessment and Plan: I have seen and evaluated the patient as outlined above. I agree with the formulated Assessment and Plan as detailed in the residents' admission note, with the following changes:   1. Recurrent non healing left plantar foot wound.  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  If no infection in the bone, would have patient get set up with wound care center for good wound care for healing of the wound.  Close monitoring. Get Cx for fever.  Wound care consult while inpatient.  ESR elevated.    2. NIDDM: Continue home meds for now.  SSI if needed.   Other issues are chronic and addressed in resident note.   Sid Falcon, MD 1/15/20154:00 PM

## 2013-01-31 NOTE — Progress Notes (Signed)
UR completed 

## 2013-01-31 NOTE — Consult Note (Signed)
WOC wound consult note Reason for Consult:  Evaluation of left plantar foot wound. Pt with history of previous ulceration here that was debrided and treated with HBO.  Recently opened back up and drainage noted from the central portion of the wound on her sock. Wound type: Neuropathic foot ulcer  Measurement: 2.0cm x 4.0cm x 0.2cm (central opening 0.1cm x 0.1cm x 0.2cm) Wound bed: darkened eschar and central opening but not able to visualize the base of this wound Drainage (amount, consistency, odor) none noted, none able to be expressed with pressure today.  Periwound:hyperkeratosis Dressing procedure/placement/frequency: no topical care recommended at this time.  May need surgical debridement after MRI, and possibly to removed hyperkeratotic skin and eschar for better chance of healing with serial debridement in a wound care center of her choice as well as appropriate offloading shoe needed.   Discussed POC with patient  Re consult if needed, will not follow at this time. Thanks  Avel Ogawa [employer], CWOCN [phone])

## 2013-01-31 NOTE — Progress Notes (Addendum)
Subjective:  Pt seen and examined in AM. No acute events overnight. Pt reports she is feeling good with no complaints. She did not have CPAP last night. Pt reports no fevers, drainage from foot, warmth, erythema, or edema.    Objective: Vital signs in last 24 hours: Filed Vitals:   01/30/13 2036 01/31/13 0505 01/31/13 0800 01/31/13 1400  BP:  107/69 115/65 137/75  Pulse:  79 71 69  Temp:  98.4 F (36.9 C) 97.9 F (36.6 C) 97.7 F (36.5 C)  TempSrc:  Oral Oral Oral  Resp: 22 20 18 20   Height: 5' 9"  (1.753 m)     Weight: 116.9 kg (257 lb 11.5 oz)     SpO2:  97% 100% 100%   Weight change:   Intake/Output Summary (Last 24 hours) at 01/31/13 1908 Last data filed at 01/31/13 1400  Gross per 24 hour  Intake    360 ml  Output   2650 ml  Net  -2290 ml   Constitutional: She is oriented to person, place, and time. She appears well-developed and well-nourished. No distress.  HENT:  Head: Normocephalic and atraumatic.  Right Ear: External ear normal.  Left Ear: External ear normal.  Nose: Nose normal.  Mouth/Throat: Oropharynx is clear and moist. No oropharyngeal exudate.  Eyes: Conjunctivae and EOM are normal. Pupils are equal, round, and reactive to light. Right eye exhibits no discharge. Left eye exhibits no discharge.  Neck: Normal range of motion. Neck supple.  Cardiovascular: Normal rate, regular rhythm and normal heart sounds.  Pulmonary/Chest: Effort normal and breath sounds normal. No respiratory distress. She has no wheezes. She has no rales.  Left chest wall hernia  Abdominal: Soft. Bowel sounds are normal. She exhibits no distension. There is no tenderness. There is no rebound and no guarding.  obese  Musculoskeletal: She exhibits no edema and no tenderness.  Neurological: She is alert and oriented to person, place, and time. No cranial nerve deficit.  Skin: Skin is warm and dry. No rash noted. She is not diaphoretic. No erythema. No pallor.  Left plantar eschar 5 cm  wide with 0.5 cm ulceration with no overlying erythema, swelling, warmth, or drainage. DP and PT pulses intact.  Psychiatric: She has a normal mood and affect. Her behavior is normal. Judgment and thought content normal.   Lab Results: Basic Metabolic Panel:  Recent Labs Lab 01/30/13 1726  NA 138  K 4.1  CL 99  CO2 25  GLUCOSE 89  BUN 31*  CREATININE 1.54*  CALCIUM 10.1   Liver Function Tests:  Recent Labs Lab 01/30/13 1726  AST 16  ALT 7  ALKPHOS 57  BILITOT <0.2*  PROT 8.0  ALBUMIN 3.9   No results found for this basename: LIPASE, AMYLASE,  in the last 168 hours No results found for this basename: AMMONIA,  in the last 168 hours CBC:  Recent Labs Lab 01/30/13 1726  WBC 8.1  NEUTROABS 4.8  HGB 10.2*  HCT 30.8*  MCV 83.7  PLT 265   Cardiac Enzymes: No results found for this basename: CKTOTAL, CKMB, CKMBINDEX, TROPONINI,  in the last 168 hours BNP: No results found for this basename: PROBNP,  in the last 168 hours D-Dimer: No results found for this basename: DDIMER,  in the last 168 hours CBG:  Recent Labs Lab 01/30/13 1328 01/30/13 1523 01/30/13 1625 01/30/13 2036 01/31/13 0730  GLUCAP 84 85 108* 144* 114*   Hemoglobin A1C:  Recent Labs Lab 01/30/13 1726  HGBA1C  6.8*   Fasting Lipid Panel: No results found for this basename: CHOL, HDL, LDLCALC, TRIG, CHOLHDL, LDLDIRECT,  in the last 168 hours Thyroid Function Tests: No results found for this basename: TSH, T4TOTAL, FREET4, T3FREE, THYROIDAB,  in the last 168 hours Coagulation: No results found for this basename: LABPROT, INR,  in the last 168 hours Anemia Panel: No results found for this basename: VITAMINB12, FOLATE, FERRITIN, TIBC, IRON, RETICCTPCT,  in the last 168 hours Urine Drug Screen: Drugs of Abuse  No results found for this basename: labopia, cocainscrnur, labbenz, amphetmu, thcu, labbarb    Alcohol Level: No results found for this basename: ETH,  in the last 168  hours Urinalysis: No results found for this basename: COLORURINE, APPERANCEUR, LABSPEC, PHURINE, GLUCOSEU, HGBUR, BILIRUBINUR, KETONESUR, PROTEINUR, UROBILINOGEN, NITRITE, LEUKOCYTESUR,  in the last 168 hours  Micro Results: Recent Results (from the past 240 hour(s))  WOUND CULTURE     Status: None   Collection Time    01/31/13 12:02 AM      Result Value Range Status   Specimen Description WOUND LEFT FOOT   Final   Special Requests NONE   Final   Gram Stain     Final   Value: NO WBC SEEN     NO SQUAMOUS EPITHELIAL CELLS SEEN     NO ORGANISMS SEEN     Performed at [facility]   Culture PENDING   Incomplete   Report Status PENDING   Incomplete   Studies/Results: Dg Foot Complete Left  01/30/2013   CLINICAL DATA:  Diabetic ulceration involving the left heel associated with CED bleeding and swelling.  EXAM: LEFT FOOT - COMPLETE 3+ VIEW  COMPARISON:  None.  FINDINGS: Marked soft tissue swelling involving the left heel pad. Soft tissue ulceration superficially. No evidence of underlying osteomyelitis. 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 Severe osseous demineralization. No evidence of acute fracture. Very large plantar calcaneal spur.  IMPRESSION: 1. Marked soft tissue swelling involving the heel pad. Superficial ulceration involving the heel pad. No evidence of underlying osteomyelitis. 2. 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 3. Severe osseous demineralization.  No acute osseous abnormality.   Electronically Signed   By: Evangeline Dakin M.D.   On: 01/30/2013 22:08   Medications: I have reviewed the patient's current medications. Scheduled Meds: . aspirin EC  81 mg Oral Daily  . ferrous sulfate  325 mg Oral Daily  . furosemide  40 mg Oral Daily  . heparin  5,000 Units Subcutaneous Q8H  . latanoprost  1 drop Both Eyes QHS  . lisinopril  20 mg Oral Daily  . multivitamin with  minerals  1 tablet Oral Daily  . pantoprazole  40 mg Oral QHS   Continuous Infusions:  PRN Meds:. Assessment/Plan: Principal Problem:   Foot ulcer, left Active Problems:   HYPERTENSION   Assessment: 58 year old woman with past medical history non-insulin dependent Type II DM (last HbA1c 6.6 on 12/05/12) who presents with recurrent left plantar wound ulcer with bloody drainage for 1 week.   Plan:   Recurrent Left Plantar Wound Ulcer - Pt with eschar pressure wound ulcer 2.0cm x 4.0cm x 0.2cm (central opening 0.1cm x 0.1cm x 0.2cm)  in setting of controlled T2DM with no signs of infection to suggest osteomyelitis or vascular compromise. Serosanguinous drainage most likely due to continued weight bearing.  -Obtain XR left foot --> charcot appearing with severe osseous demineralization and no evidence of osteomyelitis  -  Obtain MRI left foot w/cont to r/o osteomyelitis   -Obtain wound care consult --> no topical care, may need surgical debridement after MRI, with serial debridement in a wound care center, and appropriate offloading shoe   -Obtain wound culture/gram stain --> pending, gram stain neg  -Consider surgery consult for debridement, most likely outpatient f/u  -Obtain CBC w/diff (no leukocytosis), ESR (54 H) -Neurovascular checks Q4hr  -Hold antibiotics at this time as wound does not appear infected  -Limit weight-bearing   Non-insulin dependent Type II Diabetes Mellitus - CBG 114. Last HbA1c of 6.6 on 12/05/12. Pt compliant with metformin 1000 mg BID and sitagliptin 100 mg daily.  -Hold home metformin 1000 mg BID  -Hold home sitagliptin 100 mg daily  -Obtain HbA1c --> 6.8  -Monitor glucose frequently  -Consider insulin sliding scale if uncontrolled   Hypertension - currently normotensive  -Continue home lisinopril 20 mg daily  -Continue home furosemide 40 mg daily   Chronic Kidney Disease Stage III - Cr of 1.54 near baseline Cr 1.4 (?)  -Obtain CMP  -Avoid nephrotoxins   -GFR 42, likely can tolerate contrast for MRI  Iron-deficiency Anemia - currently stable with no active bleeding. Baseline Hg 10  -Continue home ferrous sulfate 325 mg daily  -Monitor for bleeding   GERD - currently without acid reflux symptoms  -Continue home pantoprazole 40 mg daily   OSA - Pt on CPAP mask at home  -Continue CPAP as tolerated PRN at night   Diet: Carb modified  DVT Ppx: SQ Heparin TID  Code: Full    Dispo: Disposition is deferred at this time, awaiting improvement of current medical problems.  Anticipated discharge in approximately 1 day.   The patient does have a current PCP Marrion Coy, MD) and does need an [facility] hospital follow-up appointment after discharge.  The patient does not have transportation limitations that hinder transportation to clinic appointments.  .Services Needed at time of discharge: Y = Yes, Blank = No PT:   OT:   RN:   Equipment:   Other:     LOS: 1 day   Juluis Mire, MD 01/31/2013, 7:08 PM

## 2013-01-31 NOTE — Progress Notes (Signed)
I saw and evaluated the patient.  I personally confirmed the key portions of the history and exam documented by Dr. Komanski and I reviewed pertinent patient test results.  The assessment, diagnosis, and plan were formulated together and I agree with the documentation in the resident's note.  

## 2013-02-01 DIAGNOSIS — L8990 Pressure ulcer of unspecified site, unspecified stage: Secondary | ICD-10-CM | POA: Diagnosis not present

## 2013-02-01 LAB — BASIC METABOLIC PANEL
BUN: 26 mg/dL — ABNORMAL HIGH (ref 6–23)
CO2: 25 mEq/L (ref 19–32)
CREATININE: 1.42 mg/dL — AB (ref 0.50–1.10)
Calcium: 9.8 mg/dL (ref 8.4–10.5)
Chloride: 100 mEq/L (ref 96–112)
GFR, EST AFRICAN AMERICAN: 47 mL/min — AB (ref >90)
GFR, EST NON AFRICAN AMERICAN: 40 mL/min — AB (ref >90)
Glucose, Bld: 125 mg/dL — ABNORMAL HIGH (ref 70–99)
Potassium: 5 mEq/L (ref 3.7–5.3)
SODIUM: 137 meq/L (ref 137–147)

## 2013-02-01 LAB — GLUCOSE, CAPILLARY: GLUCOSE-CAPILLARY: 130 mg/dL — AB (ref 70–99)

## 2013-02-01 MED ORDER — CLINDAMYCIN HCL 300 MG PO CAPS: 300.0000 mg | ORAL_CAPSULE | Freq: Three times a day (TID) | ORAL | Status: DC

## 2013-02-01 MED ORDER — CLINDAMYCIN HCL 300 MG PO CAPS
300.0000 mg | ORAL_CAPSULE | Freq: Three times a day (TID) | ORAL | Status: DC
  Administered 2013-02-01 – 2013-02-02 (×4): 300 mg via ORAL
  Filled 2013-02-01 (×6): qty 1

## 2013-02-01 MED ORDER — LEVOFLOXACIN 750 MG PO TABS
750.0000 mg | ORAL_TABLET | ORAL | Status: DC
  Administered 2013-02-01: 750 mg via ORAL
  Filled 2013-02-01: qty 1

## 2013-02-01 MED ORDER — LEVOFLOXACIN 750 MG PO TABS: 750.0000 mg | ORAL_TABLET | Freq: Every day | ORAL | Status: DC

## 2013-02-01 NOTE — Evaluation (Signed)
Physical Therapy Evaluation Patient Details Name: [name] MRN: [id number] DOB: Jun 18, 1955 Today's Date: 02/01/2013 Time: 2800-3491 PT Time Calculation (min): 39 min  PT Assessment / Plan / Recommendation History of Present Illness  Patient admitted with left plantar foot ulcer/cellulitis.  Clinical Impression  Patient presents with decreased independence with mobility due to non-weight bearing on left LE.  She can manage safely on first level of her two level apartment, but will need HHPT to determine safest method for getting upstairs.  Tried both RW and knee walker, pt prefers RW and occasionally has to touch toes on floor for balance versus due to fatigue.    PT Assessment  All further PT needs can be met in the next venue of care    Follow Up Recommendations  Home health PT    Does the patient have the potential to tolerate intense rehabilitation    n/A  Barriers to Discharge  Decreased caregiver support      Equipment Recommendations  Rolling walker with 5" wheels    Recommendations for Other Services   None  Frequency  N/A    Precautions / Restrictions Restrictions LLE Weight Bearing: Non weight bearing   Pertinent Vitals/Pain No pain      Mobility  Bed Mobility General bed mobility comments: not tested, in recliner Transfers Overall transfer level: Needs assistance Equipment used: Rolling walker (2 wheeled) Transfers: Sit to/from Stand Sit to Stand: Supervision General transfer comment: cues for hand placement Ambulation/Gait Ambulation/Gait assistance: Min guard;Supervision Ambulation Distance (Feet): 40 Feet (x 2; 120' x 1 with knee walker) Assistive device: Rolling walker (2 wheeled) (knee walker ) Gait Pattern/deviations: Step-to pattern General Gait Details: mixture of NWB and TTWB due to fatigue and mild imbalance Stairs:  (discussed staying on main level of home, use stairs when has help)        PT Diagnosis: Difficulty walking  PT  Problem List: Decreased mobility;Decreased knowledge of use of DME;Decreased knowledge of precautions PT Treatment Interventions:       PT Goals(Current goals can be found in the care plan section) Acute Rehab PT Goals PT Goal Formulation: No goals set, d/c therapy  Visit Information  Last PT Received On: 02/01/13 Assistance Needed: +1 History of Present Illness: Patient admitted with left plantar foot ulcer/cellulitis.       Prior [place] expects to be discharged to:: Private residence Living Arrangements: Alone Type of Home: Apartment Home Access: Stairs to enter [employer] of Steps: 2 from parking [place]: Two level;Bed/bath upstairs Alternate Level Stairs-Number of Steps: flight Alternate Level Stairs-Rails: Left Home Equipment: None Prior Function Level of Independence: Independent Communication Communication: No difficulties Dominant Hand: Right    Cognition  Cognition Arousal/Alertness: Awake/alert Behavior During Therapy: WFL for tasks assessed/performed Overall Cognitive Status: Within Functional Limits for tasks assessed    Extremity/Trunk Assessment Upper Extremity Assessment Upper Extremity Assessment: Overall WFL for tasks assessed Lower Extremity Assessment Lower Extremity Assessment: Overall WFL for tasks assessed   Balance Balance Overall balance assessment: Modified Independent (w/bilateral UE assist on walker)  End of Session PT - End of Session Activity Tolerance: Patient tolerated treatment well Patient left: in chair;with call bell/phone within reach  GP Functional Assessment Tool Used: Clinical Judgement Functional Limitation: Mobility: Walking and moving around Mobility: Walking and Moving Around Current Status ([id number]): At least 1 percent but less than 20 percent impaired, limited or restricted Mobility: Walking and Moving Around Goal Status [phone]): At least 1 percent but  less than 20  percent impaired, limited or restricted Mobility: Walking and Moving Around Discharge Status [phone]): At least 1 percent but less than 20 percent impaired, limited or restricted   [facility] 02/01/2013, 1:06 PM [place], [place] 02/01/2013

## 2013-02-01 NOTE — Progress Notes (Signed)
Patient not ready for CPAP at this time. Stated they would be ready at 2300 or about then. Explained that I or someone from RT would return as close that time as possible and if they changed their minds to just have the RN call us.

## 2013-02-01 NOTE — Progress Notes (Signed)
Internal Medicine Attending  Date: 02/01/2013  Patient name: [name] Medical record number: [id number] Date of birth: [birth year] Age: 58 y.o. Gender: female  I saw and evaluated the patient and discussed her care on AM rounds with house staff. I reviewed the resident's note by Dr. Johna Roles and I agree with the resident's findings and plans as documented in her note.

## 2013-02-01 NOTE — Progress Notes (Signed)
Subjective:  Pt seen and examined in AM. No acute events overnight. Pt reports she is feeling good. Pt reports no fevers but still with bloody drainage from wound ulcer (more noticeable today because of her sock) with no overlying warmth, erythema, or edema. She tolerated CPAP last night.    Objective: Vital signs in last 24 hours: Filed Vitals:   01/31/13 1400 01/31/13 2208 02/01/13 0005 02/01/13 0449  BP: 137/75 127/68  123/64  Pulse: 69 74 77 78  Temp: 97.7 F (36.5 C) 98.3 F (36.8 C)  98.3 F (36.8 C)  TempSrc: Oral Oral  Oral  Resp: 20 20 20 20   Height:      Weight:  116.9 kg (257 lb 11.5 oz)    SpO2: 100% 100% 100% 99%   Weight change: 3.401 kg (7 lb 8 oz)  Intake/Output Summary (Last 24 hours) at 02/01/13 1200 Last data filed at 02/01/13 0900  Gross per 24 hour  Intake    840 ml  Output   1100 ml  Net   -260 ml   Constitutional: She is oriented to person, place, and time. She appears well-developed and well-nourished. No distress.  HENT:  Head: Normocephalic and atraumatic.  Right Ear: External ear normal.  Left Ear: External ear normal.  Nose: Nose normal.  Mouth/Throat: Oropharynx is clear and moist. No oropharyngeal exudate.  Eyes: Conjunctivae and EOM are normal. Pupils are equal, round, and reactive to light. Right eye exhibits no discharge. Left eye exhibits no discharge.  Neck: Normal range of motion. Neck supple.  Cardiovascular: Normal rate, regular rhythm and normal heart sounds.  Pulmonary/Chest: Effort normal and breath sounds normal. No respiratory distress. She has no wheezes. She has no rales.  Left chest wall hernia  Abdominal: Soft. Bowel sounds are normal. She exhibits no distension. There is no tenderness. There is no rebound and no guarding.  obese  Musculoskeletal: She exhibits no edema and no tenderness.  Neurological: She is alert and oriented to person, place, and time. No cranial nerve deficit.  Skin: Skin is warm and dry. No rash  noted. She is not diaphoretic. No erythema. No pallor.  Left plantar eschar 5 cm wide with 0.5 cm ulceration with no overlying erythema, swelling, warmth, or drainage. DP and PT pulses intact.  Psychiatric: She has a normal mood and affect. Her behavior is normal. Judgment and thought content normal.   Lab Results: Basic Metabolic Panel:  Recent Labs Lab 01/30/13 1726 02/01/13 0855  NA 138 137  K 4.1 5.0  CL 99 100  CO2 25 25  GLUCOSE 89 125*  BUN 31* 26*  CREATININE 1.54* 1.42*  CALCIUM 10.1 9.8   Liver Function Tests:  Recent Labs Lab 01/30/13 1726  AST 16  ALT 7  ALKPHOS 57  BILITOT <0.2*  PROT 8.0  ALBUMIN 3.9   No results found for this basename: LIPASE, AMYLASE,  in the last 168 hours No results found for this basename: AMMONIA,  in the last 168 hours CBC:  Recent Labs Lab 01/30/13 1726  WBC 8.1  NEUTROABS 4.8  HGB 10.2*  HCT 30.8*  MCV 83.7  PLT 265   Cardiac Enzymes: No results found for this basename: CKTOTAL, CKMB, CKMBINDEX, TROPONINI,  in the last 168 hours BNP: No results found for this basename: PROBNP,  in the last 168 hours D-Dimer: No results found for this basename: DDIMER,  in the last 168 hours CBG:  Recent Labs Lab 01/30/13 1328 01/30/13 1523 01/30/13 1625  01/30/13 2036 01/31/13 0730 02/01/13 0843  GLUCAP 84 85 108* 144* 114* 130*   Hemoglobin A1C:  Recent Labs Lab 01/30/13 1726  HGBA1C 6.8*   Fasting Lipid Panel: No results found for this basename: CHOL, HDL, LDLCALC, TRIG, CHOLHDL, LDLDIRECT,  in the last 168 hours Thyroid Function Tests: No results found for this basename: TSH, T4TOTAL, FREET4, T3FREE, THYROIDAB,  in the last 168 hours Coagulation: No results found for this basename: LABPROT, INR,  in the last 168 hours Anemia Panel: No results found for this basename: VITAMINB12, FOLATE, FERRITIN, TIBC, IRON, RETICCTPCT,  in the last 168 hours Urine Drug Screen: Drugs of Abuse  No results found for this  basename: labopia,  cocainscrnur,  labbenz,  amphetmu,  thcu,  labbarb    Alcohol Level: No results found for this basename: ETH,  in the last 168 hours Urinalysis: No results found for this basename: COLORURINE, APPERANCEUR, LABSPEC, PHURINE, GLUCOSEU, HGBUR, BILIRUBINUR, KETONESUR, PROTEINUR, UROBILINOGEN, NITRITE, LEUKOCYTESUR,  in the last 168 hours  Micro Results: Recent Results (from the past 240 hour(s))  WOUND CULTURE     Status: None   Collection Time    01/31/13 12:02 AM      Result Value Range Status   Specimen Description WOUND LEFT FOOT   Final   Special Requests NONE   Final   Gram Stain     Final   Value: NO WBC SEEN     NO SQUAMOUS EPITHELIAL CELLS SEEN     NO ORGANISMS SEEN     Performed at [facility]   Culture     Final   Value: NO GROWTH 1 DAY     Performed at [facility]   Report Status PENDING   Incomplete   Studies/Results: Mr Ankle Left W Wo Contrast  02/01/2013   CLINICAL DATA:  Draining plantar soft tissue ulcer.  EXAM: MRI OF THE LEFT ANKLE WITHOUT AND WITH CONTRAST  TECHNIQUE: Multiplanar, multisequence MR imaging of the ankle was performed before and after the administration of intravenous contrast.  CONTRAST:  8m MULTIHANCE GADOBENATE DIMEGLUMINE 529 MG/ML IV SOLN  COMPARISON:  Radiograph dated 01/30/2013 and MRI dated 01/08/2010  FINDINGS: There is a focal soft tissue ulceration on the plantar aspect of the foot at the level of the anterior aspect of the calcaneus. The area of enhancing soft tissues is approximately 30 x 20 x 8 mm and does not extend to the plantar fascia or to the bones. There is no abscess.  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  There are moderate arthritic changes the ankle joint the small ankle joint effusion with a denuding of the articular cartilage. All there are severe arthritic changes in the tarsal bones secondary to the  neuropathic collapse of the foot.  TENDONS  Peroneal: Normal.  Posteromedial: Normal.  Anterior: Normal.  Achilles: Normal.  Plantar Fascia: No acute abnormality. Contour is distorted due to the collapse of the foot.  LIGAMENTS  Lateral: Intact.  Medial: Intact.  IMPRESSION: IMPRESSION Superficial cellulitis involving the subcutaneous fat of the plantar aspect of the heel with a soft tissue ulceration. These cellulitis extends approximately 8 mm deep but does not extend to the underlying plantar fascia or bones. No abscess.   Electronically Signed   By: JRozetta NunneryM.D.   On: 02/01/2013 08:23   Dg Foot Complete Left  01/30/2013   CLINICAL DATA:  Diabetic  ulceration involving the left heel associated with CED bleeding and swelling.  EXAM: LEFT FOOT - COMPLETE 3+ VIEW  COMPARISON:  None.  FINDINGS: Marked soft tissue swelling involving the left heel pad. Soft tissue ulceration superficially. No evidence of underlying osteomyelitis. 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 Severe osseous demineralization. No evidence of acute fracture. Very large plantar calcaneal spur.  IMPRESSION: 1. Marked soft tissue swelling involving the heel pad. Superficial ulceration involving the heel pad. No evidence of underlying osteomyelitis. 2. 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 3. Severe osseous demineralization.  No acute osseous abnormality.   Electronically Signed   By: Evangeline Dakin M.D.   On: 01/30/2013 22:08   Medications: I have reviewed the patient's current medications. Scheduled Meds: . aspirin EC  81 mg Oral Daily  . clindamycin  300 mg Oral Q8H  . ferrous sulfate  325 mg Oral Daily  . furosemide  40 mg Oral Daily  . heparin  5,000 Units Subcutaneous Q8H  . latanoprost  1 drop Both Eyes QHS  . levofloxacin  750 mg Oral Daily  . lisinopril  20 mg Oral Daily  . multivitamin with minerals  1 tablet Oral Daily    . pantoprazole  40 mg Oral QHS   Continuous Infusions:  PRN Meds:. Assessment/Plan: Principal Problem:   Foot ulcer, left Active Problems:   HYPERTENSION   Assessment: 58 year old woman with past medical history non-insulin dependent Type II DM (last HbA1c 6.6 on 12/05/12) who presents with recurrent left plantar wound ulcer with bloody drainage for 1 week.   Plan:   Recurrent Left Plantar Wound Ulcer - continued serosanguis discharge.  Pt with eschar pressure wound ulcer 2.0cm x 4.0cm x 0.2cm (central opening 0.1cm x 0.1cm x 0.2cm)  in setting of controlled T2DM and elevated ESR with no evidence of osteomyelitis on imaging.     -Neurovascular checks Q4hr  -Obtain MRI left foot w & w/cont 1/15 to r/o osteomyelitis --> Superficial cellulitis involving the subcutaneous fat of the plantar aspect of the heel with a soft tissue ulceration. Cellulitis extends approximately 8 mm deep but does not extend to the underlying plantar fascia or bones with no abscess -Obtain wound care consult 1/15--> no topical care, may need surgical debridement after MRI, with serial debridement in a wound care center, and appropriate offloading shoe   -Obtain wound culture/gram stain  1/14--> NGTD, gram stain neg  -Orthopedic surgery consult (Dr. Ninfa Linden) for possible debridement vs outpatient f/u  -Start clindamycin 300 mg TID Day 1/7 (no renal adjustment) -Start levofloxacin 750 mg Q48 hr Day 1/7 (renally adjusted)   -Obtain PT consult -Limit weight-bearing   Non-insulin dependent Type II Diabetes Mellitus - CBG 130. Last HbA1c of 6.6 on 12/05/12. Pt compliant with metformin 1000 mg BID and sitagliptin 100 mg daily.  -Hold home metformin 1000 mg BID  -Hold home sitagliptin 100 mg daily  -Obtain HbA1c --> 6.8  -Monitor glucose frequently  -Consider insulin sliding scale if uncontrolled   Hypertension - currently normotensive  -Continue home lisinopril 20 mg daily  -Continue home furosemide 40 mg daily    Chronic Kidney Disease Stage III - Cr of 1.42 with contrast on 1/15,  near baseline Cr 1.4 (?)  -Obtain BMP --> Cr  stable, however elevation usually seen after 48 hrs  -Avoid nephrotoxins   Iron-deficiency Anemia - currently stable with no active bleeding. Baseline Hg 10  -Continue home ferrous sulfate 325  mg daily  -Monitor for bleeding   GERD - currently without acid reflux symptoms  -Continue home pantoprazole 40 mg daily   OSA - Pt  tlerated on CPAP mask at home  -Continue CPAP as tolerated PRN at night   Diet: Carb modified  DVT Ppx: SQ Heparin TID  Code: Full    Dispo: Disposition is deferred at this time, awaiting improvement of current medical problems.  Anticipated discharge in approximately 1 day.   The patient does have a current PCP Marrion Coy, MD) and does need an [facility] hospital follow-up appointment after discharge.  The patient does not have transportation limitations that hinder transportation to clinic appointments.  .Services Needed at time of discharge: Y = Yes, Blank = No PT:   OT:   RN:   Equipment:   Other:     LOS: 2 days   Juluis Mire, MD 02/01/2013, 12:00 PM

## 2013-02-01 NOTE — Progress Notes (Signed)
   CARE MANAGEMENT NOTE 02/01/2013  Patient:  [name], [name]   Account Number:  [id number]  Date Initiated:  02/01/2013  Documentation initiated by:  Lizabeth Leyden  Subjective/Objective Assessment:   admitted with diabetic left plantar foot ulcer     Action/Plan:   progression of care and discharge planning  monitor wound care needs   Anticipated DC Date:  02/02/2013   Anticipated DC Plan:  [place]  CM consult      [facility] Choice  HOME HEALTH   Choice offered to / List presented to:  [name]   DME arranged  [facility]      DME agency  [place] arranged  [place].   Status of service:  In process, will continue to follow Medicare Important Message given?   (If response is "NO", the following Medicare IM given date fields will be blank) Date Medicare IM given:   Date Additional Medicare IM given:    Discharge Disposition:  [place]  Per UR Regulation:    If discussed at Long Length of Stay Meetings, dates discussed:    Comments:  02/01/2013  [place], [place] [phone] CM referral:  home health PT,  DME:  RW  met with patient regarding home health services. She selected [facility].  [place] called with referral [facility] called with referral for RW

## 2013-02-01 NOTE — Consult Note (Signed)
Reason for Consult:  Left foot ulcer Referring Physician: Family Medicine Teaching Service.  [name] is an 58 y.o. female.  HPI:   58 yo diabetic female with a left heel ulcer that had some bleeding earlier this week.  Was admitted for IV antibiotics.  Ortho is consulted today to address the foot ulcer.  Past Medical History  Diagnosis Date  . Normocytic anemia   . Hypertension   . Foot ulcer, left   . Leg edema   . Glaucoma   . GERD (gastroesophageal reflux disease)   . High cholesterol   . Type II diabetes mellitus   . OSA on CPAP   . H/O hiatal hernia   . Arthritis     "right leg" (01/30/2013)  . CKD (chronic kidney disease), stage III     /notes 01/30/2013    Past Surgical History  Procedure Laterality Date  . Oophorectomy Bilateral 1975-1976  . Appendectomy  1975  . I&d extremity Left 2011    "ulcer on my foot got infected" (01/30/2013)  . Multiple tooth extractions Bilateral     Family History  Problem Relation Age of Onset  . Diabetes Mother   . Hypertension Mother   . Heart disease Father   . Stroke Father   . Diabetes Father   . Hypertension Father   . Gout Father   . Deep vein thrombosis Father     Social History:  reports that she quit smoking about 40 years ago. Her smoking use included Cigarettes. She has a 2 pack-year smoking history. She has never used smokeless tobacco. She reports that she does not drink alcohol or use illicit drugs.  Allergies:  Allergies  Allergen Reactions  . Codeine     REACTION: swelling in throat and itching  . Pravastatin Other (See Comments)    cramps    Medications: I have reviewed the patient's current medications.  Results for orders placed during the hospital encounter of 01/30/13 (from the past 48 hour(s))  WOUND CULTURE     Status: None   Collection Time    01/31/13 12:02 AM      Result Value Range   Specimen Description WOUND LEFT FOOT     Special Requests NONE     Gram Stain       Value: NO WBC  SEEN     NO SQUAMOUS EPITHELIAL CELLS SEEN     NO ORGANISMS SEEN     Performed at [facility]   Culture       Value: NO GROWTH 1 DAY     Performed at [facility]   Report Status PENDING    GLUCOSE, CAPILLARY     Status: Abnormal   Collection Time    01/31/13  7:30 AM      Result Value Range   Glucose-Capillary 114 (*) 70 - 99 mg/dL  BASIC METABOLIC PANEL     Status: Abnormal   Collection Time    02/01/13  8:55 AM      Result Value Range   Sodium 137  137 - 147 mEq/L   Potassium 5.0  3.7 - 5.3 mEq/L   Comment: DELTA CHECK NOTED   Chloride 100  96 - 112 mEq/L   CO2 25  19 - 32 mEq/L   Glucose, Bld 125 (*) 70 - 99 mg/dL   BUN 26 (*) 6 - 23 mg/dL   Creatinine, Ser 1.42 (*) 0.50 - 1.10 mg/dL   Calcium 9.8  8.4 -  10.5 mg/dL   GFR calc non Af Amer 40 (*) >90 mL/min   GFR calc Af Amer 47 (*) >90 mL/min   Comment: (NOTE)     The eGFR has been calculated using the CKD EPI equation.     This calculation has not been validated in all clinical situations.     eGFR's persistently <90 mL/min signify possible Chronic Kidney     Disease.    Mr Ankle Left W Wo Contrast  02/01/2013   CLINICAL DATA:  Draining plantar soft tissue ulcer.  EXAM: MRI OF THE LEFT ANKLE WITHOUT AND WITH CONTRAST  TECHNIQUE: Multiplanar, multisequence MR imaging of the ankle was performed before and after the administration of intravenous contrast.  CONTRAST:  35m MULTIHANCE GADOBENATE DIMEGLUMINE 529 MG/ML IV SOLN  COMPARISON:  Radiograph dated 01/30/2013 and MRI dated 01/08/2010  FINDINGS: There is a focal soft tissue ulceration on the plantar aspect of the foot at the level of the anterior aspect of the calcaneus. The area of enhancing soft tissues is approximately 30 x 20 x 8 mm and does not extend to the plantar fascia or to the bones. There is no abscess.  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  There are moderate  arthritic changes the ankle joint the small ankle joint effusion with a denuding of the articular cartilage. All there are severe arthritic changes in the tarsal bones secondary to the neuropathic collapse of the foot.  TENDONS  Peroneal: Normal.  Posteromedial: Normal.  Anterior: Normal.  Achilles: Normal.  Plantar Fascia: No acute abnormality. Contour is distorted due to the collapse of the foot.  LIGAMENTS  Lateral: Intact.  Medial: Intact.  IMPRESSION: IMPRESSION Superficial cellulitis involving the subcutaneous fat of the plantar aspect of the heel with a soft tissue ulceration. These cellulitis extends approximately 8 mm deep but does not extend to the underlying plantar fascia or bones. No abscess.   Electronically Signed   By: JRozetta NunneryM.D.   On: 02/01/2013 08:23   Dg Foot Complete Left  01/30/2013   CLINICAL DATA:  Diabetic ulceration involving the left heel associated with CED bleeding and swelling.  EXAM: LEFT FOOT - COMPLETE 3+ VIEW  COMPARISON:  None.  FINDINGS: Marked soft tissue swelling involving the left heel pad. Soft tissue ulceration superficially. No evidence of underlying osteomyelitis. 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 Severe osseous demineralization. No evidence of acute fracture. Very large plantar calcaneal spur.  IMPRESSION: 1. Marked soft tissue swelling involving the heel pad. Superficial ulceration involving the heel pad. No evidence of underlying osteomyelitis. 2. 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 3. Severe osseous demineralization.  No acute osseous abnormality.   Electronically Signed   By: TEvangeline DakinM.D.   On: 01/30/2013 22:08    ROS Blood pressure 112/61, pulse 79, temperature 98.8 F (37.1 C), temperature source Oral, resp. rate 18, height _0  (1.753 m), weight 116.9 kg (257 lb 11.5 oz), SpO2 100.00%. Physical Exam  Musculoskeletal:        Feet:  Her foot is warm and well-perfused with minimal swelling.  Assessment/Plan: Left foot plantar ulcer (Stage I). 1)  I debrided the wound on her left foot at the bedside with a 315 blade and got to good bleeding tissue.  The was no gross purulence and now exposed bone.  A xeroform dressing was applied.  2)  She will need daily soaks of that foot in soapy water for 15-20 minutes followed by a new piece of xeroform and a dry dressing.  She can put weight on her foot in a post-op shoe.  She can follow-up with her Podiatrist or the [place] or a [facility].  No further treatment needed during this hospital stay.  BLACKMAN,CHRISTOPHER Y 02/01/2013, 9:05 PM

## 2013-02-01 NOTE — Progress Notes (Signed)
   CARE MANAGEMENT NOTE 02/01/2013  Patient:  [name], [name]   Account Number:  [id number]  Date Initiated:  02/01/2013  Documentation initiated by:  Darlyne Russian  Subjective/Objective Assessment:   admitted with diabetic left plantar foot ulcer     Action/Plan:   progression of care and discharge planning  monitor wound care needs   Anticipated DC Date:  02/02/2013   Anticipated DC Plan:  HOME/SELF CARE         Choice offered to / List presented to:             Status of service:  In process, will continue to follow Medicare Important Message given?   (If response is "NO", the following Medicare IM given date fields will be blank) Date Medicare IM given:   Date Additional Medicare IM given:    Discharge Disposition:    Per UR Regulation:    If discussed at Long Length of Stay Meetings, dates discussed:    Comments:

## 2013-02-02 DIAGNOSIS — N183 Chronic kidney disease, stage 3 unspecified: Secondary | ICD-10-CM | POA: Diagnosis present

## 2013-02-02 DIAGNOSIS — E1122 Type 2 diabetes mellitus with diabetic chronic kidney disease: Secondary | ICD-10-CM | POA: Diagnosis present

## 2013-02-02 HISTORY — DX: Chronic kidney disease, stage 3 unspecified: N18.30

## 2013-02-02 LAB — BASIC METABOLIC PANEL
BUN: 28 mg/dL — ABNORMAL HIGH (ref 6–23)
CALCIUM: 9.3 mg/dL (ref 8.4–10.5)
CO2: 26 meq/L (ref 19–32)
Chloride: 102 mEq/L (ref 96–112)
Creatinine, Ser: 1.49 mg/dL — ABNORMAL HIGH (ref 0.50–1.10)
GFR calc Af Amer: 44 mL/min — ABNORMAL LOW (ref >90)
GFR calc non Af Amer: 38 mL/min — ABNORMAL LOW (ref >90)
Glucose, Bld: 150 mg/dL — ABNORMAL HIGH (ref 70–99)
POTASSIUM: 4.6 meq/L (ref 3.7–5.3)
SODIUM: 138 meq/L (ref 137–147)

## 2013-02-02 LAB — WOUND CULTURE
Culture: NO GROWTH
Gram Stain: NONE SEEN

## 2013-02-02 LAB — GLUCOSE, CAPILLARY: Glucose-Capillary: 127 mg/dL — ABNORMAL HIGH (ref 70–99)

## 2013-02-02 MED ORDER — CLINDAMYCIN HCL 300 MG PO CAPS: 300.0000 mg | ORAL_CAPSULE | Freq: Three times a day (TID) | ORAL | Status: AC

## 2013-02-02 MED ORDER — LEVOFLOXACIN 750 MG PO TABS: 750.0000 mg | ORAL_TABLET | ORAL | Status: AC

## 2013-02-02 NOTE — Progress Notes (Signed)
   CARE MANAGEMENT NOTE 02/02/2013  Patient:  [name], [name]   Account Number:  [id number]  Date Initiated:  02/01/2013  Documentation initiated by:  Lizabeth Leyden  Subjective/Objective Assessment:   admitted with diabetic left plantar foot ulcer     Action/Plan:   progression of care and discharge planning  monitor wound care needs   Anticipated DC Date:  02/02/2013   Anticipated DC Plan:  [place]  CM consult      [facility] Choice  HOME HEALTH   Choice offered to / List presented to:  [name]   DME arranged  [facility]assie Moselle      DME agency  [place] arranged  [name].   Status of service:  Completed, signed off Medicare Important Message given?   (If response is "NO", the following Medicare IM given date fields will be blank) Date Medicare IM given:   Date Additional Medicare IM given:    Discharge Disposition:  [place]  Per UR Regulation:    If discussed at Long Length of Stay Meetings, dates discussed:    Comments:  02/02/13 14:13 CM notified [facility] of pt discharge.  No other CM needs were communicated.  Mariane Masters, BSN, [place] [phone].  02/01/2013  [address] RN, [place] CM referral:  home health PT,  DME:  RW  met with patient regarding home health services. She selected [place].  [place] called with referral [facility] called with referral for RW

## 2013-02-02 NOTE — Progress Notes (Signed)
Patient discharge Home per Md order.  Discharge instructions reviewed with patient and family.  Copies of all forms given and explained. Patient/family voiced understanding of all instructions.  Discharge in no acute distress. 

## 2013-02-02 NOTE — Progress Notes (Deleted)
   CARE MANAGEMENT NOTE 02/02/2013  Patient:  [name],[name]   Account Number:  [id number]  Date Initiated:  01/23/2013  Documentation initiated by:  WEBSTER,KRISTI  Subjective/Objective Assessment:   Pt admitted with sepsis/infection     Action/Plan:   PTA pt lived at home with spouse- PTA had HH- RN/PT and iv abx with [facility] - will need resumption orders at discharge for HH- NCM to follow   Anticipated DC Date:  02/03/2013   Anticipated DC Plan:  HOME W HOME HEALTH SERVICES      DC Planning Services  CM consult      PAC Choice  Resumption Of Svcs/PTA Provider   Choice offered to / List presented to:  [name]   DME arranged  IV PUMP/EQUIPMENT      DME agency  [facility]     HH arranged  HH-1 RN  HH-2 PT      HH agency  [facility]   Status of service:  Completed, signed off Medicare Important Message given?   (If response is "NO", the following Medicare IM given date fields will be blank) Date Medicare IM given:   Date Additional Medicare IM given:    Discharge Disposition:  HOME W HOME HEALTH SERVICES  Per UR Regulation:  Reviewed for med. necessity/level of care/duration of stay  If discussed at Long Length of Stay Meetings, dates discussed:   01/29/2013  01/31/2013    Comments:  02/02/13 14:00 CM spoke with [facility] pharmacy who has IV prescription.  Last run of cefipime to be at 14:00.  [facility] notified of last run and first run at home to be at 22:00. No other CM needs were communicated.  Kirti Carl, BSN, CM [phone].  01/31/13 JULIE AMERSON,RN,BSN [phone] PT'S HOSP STAY COMPLICATED BY SBO.  ADVANCING DIET.  PT PROGRESSING WELL.  

## 2013-02-02 NOTE — Progress Notes (Signed)
Subjective:  [name] was seen and examined at bedside this morning.  She has no complaints at this time and would like to go home.  She tolerated the left foot plantar ulcer debridement well last night by ortho and will follow up with wound care.     Objective: Vital signs in last 24 hours: Filed Vitals:   02/01/13 1805 02/01/13 2100 02/02/13 0554 02/02/13 0800  BP: 112/61 115/53 113/58 125/63  Pulse: 79 74 71 80  Temp: 98.8 F (37.1 C) 98.1 F (36.7 C) 97.6 F (36.4 C) 98.2 F (36.8 C)  TempSrc: Oral Oral Oral Oral  Resp: 18 18 16 18   Height:      Weight:      SpO2: 100% 100% 99% 100%   Weight change:   Intake/Output Summary (Last 24 hours) at 02/02/13 1237 Last data filed at 02/02/13 1001  Gross per 24 hour  Intake   1080 ml  Output   4025 ml  Net  -2945 ml   Vitals reviewed. General: sitting in char, NAD HEENT: PERRL, EOMI Cardiac: RRR, no rubs, murmurs or gallops Pulm: clear to auscultation bilaterally, no wheezes, rales, or rhonchi Abd: soft, nontender, nondistended, BS present Ext: warm and well perfused, no pedal edema, + scars on b/l extremities. +left foot plantar ulcer in wrapped with dry dressing that has dried up blood at site of debridement. Upon examination of wound, looks clean, no oozing blood or pus, no surrounding edema or erythema and no tenderness to palpation. Exposed central area of bone.  Neuro: alert and oriented X3, cranial nerves II-XII grossly intact, strength equal in b/l extremities, slightly decreased on LLE vs. RLE s/p debridement due to effort.  Lab Results: Basic Metabolic Panel:  Recent Labs Lab 02/01/13 0855 02/02/13 0520  NA 137 138  K 5.0 4.6  CL 100 102  CO2 25 26  GLUCOSE 125* 150*  BUN 26* 28*  CREATININE 1.42* 1.49*  CALCIUM 9.8 9.3   Liver Function Tests:  Recent Labs Lab 01/30/13 1726  AST 16  ALT 7  ALKPHOS 57  BILITOT <0.2*  PROT 8.0  ALBUMIN 3.9   CBC:  Recent Labs Lab 01/30/13 1726  WBC 8.1    NEUTROABS 4.8  HGB 10.2*  HCT 30.8*  MCV 83.7  PLT 265   CBG:  Recent Labs Lab 01/30/13 1523 01/30/13 1625 01/30/13 2036 01/31/13 0730 02/01/13 0843 02/02/13 0728  GLUCAP 85 108* 144* 114* 130* 127*   Hemoglobin A1C:  Recent Labs Lab 01/30/13 1726  HGBA1C 6.8*   Micro Results: Recent Results (from the past 240 hour(s))  WOUND CULTURE     Status: None   Collection Time    01/31/13 12:02 AM      Result Value Range Status   Specimen Description WOUND LEFT FOOT   Final   Special Requests NONE   Final   Gram Stain     Final   Value: NO WBC SEEN     NO SQUAMOUS EPITHELIAL CELLS SEEN     NO ORGANISMS SEEN     Performed at 02/02/13   Culture     Final   Value: NO GROWTH 2 DAYS     Performed at [facility]   Report Status 02/02/2013 FINAL   Final   Studies/Results: Mr Ankle Left W Wo Contrast  02/01/2013   CLINICAL DATA:  Draining plantar soft tissue ulcer.  EXAM: MRI OF THE LEFT ANKLE WITHOUT AND WITH CONTRAST  TECHNIQUE:  Multiplanar, multisequence MR imaging of the ankle was performed before and after the administration of intravenous contrast.  CONTRAST:  10mL MULTIHANCE GADOBENATE DIMEGLUMINE 529 MG/ML IV SOLN  COMPARISON:  Radiograph dated 01/30/2013 and MRI dated 01/08/2010  FINDINGS: There is a focal soft tissue ulceration on the plantar aspect of the foot at the level of the anterior aspect of the calcaneus. The area of enhancing soft tissues is approximately 30 x 20 x 8 mm and does not extend to the plantar fascia or to the bones. There is no abscess.  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  There are moderate arthritic changes the ankle joint the small ankle joint effusion with a denuding of the articular cartilage. All there are severe arthritic changes in the tarsal bones secondary to the neuropathic collapse of the foot.  TENDONS  Peroneal: Normal.   Posteromedial: Normal.  Anterior: Normal.  Achilles: Normal.  Plantar Fascia: No acute abnormality. Contour is distorted due to the collapse of the foot.  LIGAMENTS  Lateral: Intact.  Medial: Intact.  IMPRESSION: IMPRESSION Superficial cellulitis involving the subcutaneous fat of the plantar aspect of the heel with a soft tissue ulceration. These cellulitis extends approximately 8 mm deep but does not extend to the underlying plantar fascia or bones. No abscess.   Electronically Signed   By: Geanie Cooley M.D.   On: 02/01/2013 08:23   Medications: I have reviewed the patient's current medications. Scheduled Meds: . aspirin EC  81 mg Oral Daily  . clindamycin  300 mg Oral Q8H  . ferrous sulfate  325 mg Oral Daily  . furosemide  40 mg Oral Daily  . heparin  5,000 Units Subcutaneous Q8H  . latanoprost  1 drop Both Eyes QHS  . levofloxacin  750 mg Oral Q48H  . lisinopril  20 mg Oral Daily  . multivitamin with minerals  1 tablet Oral Daily  . pantoprazole  40 mg Oral QHS   Continuous Infusions:  PRN Meds:. Assessment/Plan: Principal Problem:   Foot ulcer, left Active Problems:   HYPERTENSION  Assessment: 58 year old woman with past medical history non-insulin dependent Type II DM (last HbA1c 6.6 on 12/05/12) admitted for Left plantar ulcer with drainage.   Recurrent Left Plantar Diabetic Ulcer--s/p debridement by Ortho 02/01/13.  Now with central area of exposed bone, but wound clean and dry, no oozing blood or pus. No OM per MRI but cellulitis present and started on 7 day course of antibiotics. continued serosanguis discharge. Wound culture negative.  -will need daily dressing changes, daily soaks of foot in soapy water for 15-20 minutes followed by new piece of xeroform and dry dressing.  -PT recommended home health PT initially for balance due to non-weight bearing on heel, however per ortho can put weight on her foot in a post op shoe.  Discussed with PT who said she will need further  guidance and work to get good balance with shoe.  -follow up with wound care clinic, already scheduled for early next week. Will also provide ortho information if she decides to follow up there and pcp follow up. -arranged for ortho tech to provide post-op boot -continue po clindamycin and levofloxacin--today is day 2/7  DM2 well controlled--HbA1c of 6.8. On metformin 1000 mg BID and sitagliptin 100 mg daily at home -resume home meds on discharge  Hypertension--stable -Continue home meds: lisinopril and lasix.  Chronic Kidney Disease Stage III - Cr  of 1.54 on admission and trending down.  Baseline unclear.  -continue to monitor  Iron-deficiency Anemia--Baseline Hb ~10  -Continue home ferrous sulfate 325 mg daily   GERD - currently without acid reflux symptoms  -Continue home pantoprazole 40 mg daily   OSA-on CPAP at home  -Continue CPAP as tolerated PRN at night   Diet: Carb modified  DVT Ppx: SQ Heparin TID  Code: Full  Dispo: d/c home today  The patient does have a current PCP Pleas Koch, MD) and does need an [facility] hospital follow-up appointment after discharge.  The patient does not have transportation limitations that hinder transportation to clinic appointments.  .Services Needed at time of discharge: Y = Yes, Blank = No PT: Home health PT  OT:   RN:   Equipment: Rolling walker  Other:     LOS: 3 days   Darden Palmer, MD 02/02/2013, 12:37 PM

## 2013-02-02 NOTE — Discharge Summary (Signed)
Name: [name] MRN: [id number] DOB: [birth year] 58 y.o. PCP: Pleas Koch, MD  Date of Admission: 01/30/2013  3:36 PM Date of Discharge: 02/02/2013 Attending Physician: Dr. Paisyn Pel Discharge Diagnosis: Principal Problem:   Foot ulcer, left Active Problems:   HYPERTENSION   CKD (chronic kidney disease) stage 3, GFR 30-59 ml/min   Diabetes mellitus type II, controlled   ANEMIA, NORMOCYTIC   Obstructive sleep apnea   GERD (gastroesophageal reflux disease)  Discharge Medications:   Medication List         acetaminophen 325 MG tablet  Commonly known as:  TYLENOL  Take 1 tablet (325 mg total) by mouth every 6 (six) hours as needed. For back pain.     aspirin EC 81 MG tablet  Take 81 mg by mouth daily.     Calcium Carbonate-Vitamin D 600-400 MG-UNIT per chew tablet  Chew 1 tablet by mouth 2 (two) times daily.     clindamycin 300 MG capsule  Commonly known as:  CLEOCIN  Take 1 capsule (300 mg total) by mouth every 8 (eight) hours.     ferrous sulfate 325 (65 FE) MG tablet  Take 325 mg by mouth daily. With orange juice     furosemide 40 MG tablet  Commonly known as:  LASIX  Take 40 mg by mouth daily.     glucose blood test strip  Commonly known as:  ACCU-CHEK AVIVA PLUS  Use as directed for once daily glucose testing. Dx: ICD-9 250.00.     levofloxacin 750 MG tablet  Commonly known as:  LEVAQUIN  Take 1 tablet (750 mg total) by mouth every other day.  Start taking on:  02/03/2013     lisinopril 20 MG tablet  Commonly known as:  PRINIVIL,ZESTRIL  Take 20 mg by mouth daily.     LUMIGAN 0.01 % Soln  Generic drug:  bimatoprost  Place 1 drop into both eyes every evening.     metFORMIN 1000 MG tablet  Commonly known as:  GLUCOPHAGE  Take 1,000 mg by mouth 2 (two) times daily with a meal.     multivitamin tablet  Take 1 tablet by mouth daily.     pantoprazole 40 MG tablet  Commonly known as:  PROTONIX  Take 40 mg by mouth every evening.     sitaGLIPtin 100 MG tablet  Commonly known as:  JANUVIA  Take 100 mg by mouth daily.       Disposition and follow-up:   Ms.Karmen ROSEANNA KOPLIN was discharged from [facility] in stable condition.  At the hospital follow up visit please address:  L plantar ulcer--s/p debridement. Follow up with wound care. Healing and pain control assessment. Able to weight bear and participate in PT? Completed course of Levaquin and clindamycin total 7 days?  CKD 3--Cr 1.49 on discharge. Unclear baseline, ~1.3-1.4 since 2012.   2.  Labs / imaging needed at time of follow-up: BMET: monitor renal function  3.  Pending labs/ test needing follow-up: none  Follow-up Appointments:     Follow-up Information   Schedule an appointment as soon as possible for a visit with Pleas Koch, MD. (1-2 weeks)    Specialty:  Internal Medicine   Contact information:   [address] [place] [address] [phone]       Follow up with [facility]              On 02/06/2013. (@2pm )    Contact information:  [address] [place] [address] [phone]      Follow up with [facility].   Contact information:   [address] [place] [address] [phone]      Follow up with [facility] On 02/11/2013. (@230PM )    Specialty:  Podiatry   Contact information:   [address] [place] [phone]      Follow up with [facility]. (home health physical therapy)    Contact information:   [address] [place] [phone]      Discharge Instructions: Discharge Orders   Future Appointments Provider Department Dept Phone   02/06/2013 2:00 PM [facility] 02/08/2013 [facility] [phone]   02/11/2013 2:30 PM 02/13/2013, DPM [facility] [phone]   Future  Orders Complete By Expires   Call MD for:  redness, tenderness, or signs of infection (pain, swelling, redness, odor or green/yellow discharge around incision site)  As directed    Call MD for:  severe uncontrolled pain  As directed    Call MD for:  temperature >100.4  As directed    Change dressing (specify)  As directed    Comments:     Daily soaks of that foot in soapy water for 15-20 minutes followed by a new piece of xeroform and a dry dressing.   Diet Carb Modified  As directed    Discharge instructions  As directed    Comments:     Daily soaks of that foot in soapy water for 15-20 minutes followed by a new piece of xeroform and a dry dressing.   Increase activity slowly  As directed    Increase activity slowly  As directed    Walk with assistance  As directed    Walker   As directed      Consultations:  Ortho  Procedures Performed:  Mr Ankle Left W Wo Contrast  02/01/2013   CLINICAL DATA:  Draining plantar soft tissue ulcer.  EXAM: MRI OF THE LEFT ANKLE WITHOUT AND WITH CONTRAST  TECHNIQUE: Multiplanar, multisequence MR imaging of the ankle was performed before and after the administration of intravenous contrast.  CONTRAST:  35mL MULTIHANCE GADOBENATE DIMEGLUMINE 529 MG/ML IV SOLN  COMPARISON:  Radiograph dated 01/30/2013 and MRI dated 01/08/2010  FINDINGS: There is a focal soft tissue ulceration on the plantar aspect of the foot at the level of the anterior aspect of the calcaneus. The area of enhancing soft tissues is approximately 30 x 20 x 8 mm and does not extend to the plantar fascia or to the bones. There is no abscess.  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  There are moderate arthritic changes the ankle joint the small ankle joint effusion with a denuding of the articular cartilage. All there are severe arthritic changes in the tarsal bones secondary to the neuropathic collapse of the foot.   TENDONS  Peroneal: Normal.  Posteromedial: Normal.  Anterior: Normal.  Achilles: Normal.  Plantar Fascia: No acute abnormality. Contour is distorted due to the collapse of the foot.  LIGAMENTS  Lateral: Intact.  Medial: Intact.  IMPRESSION: IMPRESSION Superficial cellulitis involving the subcutaneous fat of the plantar aspect of the heel with a soft tissue ulceration. These cellulitis extends approximately 8 mm deep but does not extend to the underlying  plantar fascia or bones. No abscess.   Electronically Signed   By: Geanie Cooley M.D.   On: 02/01/2013 08:23   Dg Foot Complete Left  01/30/2013   CLINICAL DATA:  Diabetic ulceration involving the left heel associated with CED bleeding and swelling.  EXAM: LEFT FOOT - COMPLETE 3+ VIEW  COMPARISON:  None.  FINDINGS: Marked soft tissue swelling involving the left heel pad. Soft tissue ulceration superficially. No evidence of underlying osteomyelitis. 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 Severe osseous demineralization. No evidence of acute fracture. Very large plantar calcaneal spur.  IMPRESSION: 1. Marked soft tissue swelling involving the heel pad. Superficial ulceration involving the heel pad. No evidence of underlying osteomyelitis. 2. 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 3. Severe osseous demineralization.  No acute osseous abnormality.   Electronically Signed   By: Hulan Saas M.D.   On: 01/30/2013 22:08   Admission HPI: Denisia Harpole is a 58 year old woman with past medical history non-insulin dependent Type II DM (last HbA1c 6.6 on 12/05/12), diabetic foot ulcer with debridement in 2011, CKD OSA on CPAP, iron-deficiency anemia on iron supplements, and GERD who presents with left plantar wound ulcer with bloody drainage for 1 week.  Pt reports having diabetic left foot plantar ulcer consistent with cellulitis with posterior compartment infectious  myositis, requiring surgical debridement 01/09/10 and long course of PO antibiotics with frequent wound care and hyperbaric chamber therapy. She denies past history of osteomyelitis. She reports that her ulcer completely resolved and no longer needed wound care since 2013 with no problems until approximately 1 month ago when she noticed a black appearing wound in the same location with drainage of small amount of blood about 1 week ago. She denies recent trauma, injury, or falls. She also denies fever, chills, pain, erythema, edema, purulent drainage, paresthesias, or weakness. She has been cleaning and applying topical agent consisting of honey to the wound. She is able to walk but trying to avoid full weight on the foot due to drainage more than pain.  Her last HbA1c was 6.6 on 12/05/12 and reports being compliant with metformin and sitagliptin use. She reports hypoglycemia episode yesterday and having blurred vision and neuropathy at baseline.   Hospital Course by problem list: Principal Problem:   Foot ulcer, left Active Problems:   HYPERTENSION   CKD (chronic kidney disease) stage 3, GFR 30-59 ml/min   Diabetes mellitus type II, controlled   ANEMIA, NORMOCYTIC   Obstructive sleep apnea   GERD (gastroesophageal reflux disease)   Left foot plantar diabetic ulcer s/p debridement by ortho 02/01/13.  Admitted for 3 weeks worsening of ulcer with bloody drainage.  Ruled out for osteomyelitis per MRI but noted to have superficial cellulitis extending approximately 62mm deep involving the subcutaneous fat of the plantar aspect of the heel with a soft tissue ulceration.  No fevers during admission and no cellulitis, however started on treatment for cellulitis with po clindamycin and levaquin to be continued on discharge for total of 7 days. Ms. Pinegar will follow up with the wound center as an outpatient and with pcp office.  PT recommended home health PT services and ortho recommended foot to be placed in  post-op boot and weight bearing with boot that was arranged on day of discharge.  She will need daily soaks of left foot in soapy water for 15-20 minutes followed by a new piece of xeroform on ulcer and a dry dressing.  HTN--stable during admission.  Continued on home medications including Lisinopril 20mg  qd and Lasix 40mg  qd.   CKD stage 3--Cr trending down during admission.  On admission Cr of 1.54 and down to 1.49 on discharge.  Unclear baseline but ~1.3-1.4 since 2012.  Antibiotics were renally dosed during admission and on discharge.  Recommended to follow up with pcp and have bmet on follow up visit to follow trend.   DM2--well controlled.  Home medications include Januvia and Metformin that were held during admission but continued on discharge.  CBG's stable during hospital course.  HbA1C 6.8.  CBG monitoring recommended on discharge along with daily foot checks.  Follow up with pcp.  OSA--on CPAP at home and continued during admission.   Anemia--likely iron deficiency anemia--on ferrous sulfate 325mg  daily at home.  Baseline Hb ~10.  Hb on discharge 10.2.  Continued on iron supplementation during admission.  Recommend repeat iron panel as last one was in 2011.    GERD--continued on home Protonix during hospital course and on discharge.  Consider reassessing if 8 weeks have been completed.   Discharge Vitals:   BP 125/67  Pulse 82  Temp(Src) 98.3 F (36.8 C) (Oral)  Resp 20  Ht 5\' 9"  (1.753 m)  Wt 257 lb 11.5 oz (116.9 kg)  BMI 38.04 kg/m2  SpO2 98%  Discharge Labs:  Results for orders placed during the hospital encounter of 01/30/13 (from the past 24 hour(s))  BASIC METABOLIC PANEL     Status: Abnormal   Collection Time    02/02/13  5:20 AM      Result Value Range   Sodium 138  137 - 147 mEq/L   Potassium 4.6  3.7 - 5.3 mEq/L   Chloride 102  96 - 112 mEq/L   CO2 26  19 - 32 mEq/L   Glucose, Bld 150 (*) 70 - 99 mg/dL   BUN 28 (*) 6 - 23 mg/dL   Creatinine, Ser 2.11 (*)  0.50 - 1.10 mg/dL   Calcium 9.3  8.4 - 94.1 mg/dL   GFR calc non Af Amer 38 (*) >90 mL/min   GFR calc Af Amer 44 (*) >90 mL/min   Signed: Darden Palmer, MD 02/02/2013, 9:14 PM   Time Spent on Discharge: 40 minutes Services Ordered on Discharge: home health PT Equipment Ordered on Discharge: rolling walker, post op boot

## 2013-02-02 NOTE — Progress Notes (Signed)
Orthopedic Tech Progress Note Patient Details:  [name] [birth year] [id number]  Ortho Devices Type of Ortho Device: Postop shoe/boot Ortho Device/Splint Location: lle Ortho Device/Splint Interventions: Application   [name] 02/02/2013, 1:30 PM

## 2013-02-02 NOTE — Discharge Instructions (Signed)
Dear Ms. Granlund, thank you for letting us take care of you this admission.  Please follow up with wound care as scheduled and your primary care physician.  For your left foot ulcer: please continue to take your antibiotics: clindamycin three times a day, until the 22nd of January.  You will need to take one more dose tonight and then start with three a day tomorrow. Please also take levofloxacin one tablet every other day, starting tomorrow (1/18, 1/20, and then last dose 1/22).  For your dressing: Daily soaks of that foot in soapy water for 15-20 minutes followed by a new piece of xeroform and a dry dressing and follow up with wound care  You can bear weight with the post-op boot in place but get further assistance with PT per their recommendations  Daily foot checks and continue to control your diabetes   Diabetes and Foot Care Diabetes may cause you to have problems because of poor blood supply (circulation) to your feet and legs. This may cause the skin on your feet to become thinner, break easier, and heal more slowly. Your skin may become dry, and the skin may peel and crack. You may also have nerve damage in your legs and feet causing decreased feeling in them. You may not notice minor injuries to your feet that could lead to infections or more serious problems. Taking care of your feet is one of the most important things you can do for yourself.  HOME CARE INSTRUCTIONS  Wear shoes at all times, even in the house. Do not go barefoot. Bare feet are easily injured.  Check your feet daily for blisters, cuts, and redness. If you cannot see the bottom of your feet, use a mirror or ask someone for help.  Wash your feet with warm water (do not use hot water) and mild soap. Then pat your feet and the areas between your toes until they are completely dry. Do not soak your feet as this can dry your skin.  Apply a moisturizing lotion or petroleum jelly (that does not contain alcohol and is  unscented) to the skin on your feet and to dry, brittle toenails. Do not apply lotion between your toes.  Trim your toenails straight across. Do not dig under them or around the cuticle. File the edges of your nails with an emery board or nail file.  Do not cut corns or calluses or try to remove them with medicine.  Wear clean socks or stockings every day. Make sure they are not too tight. Do not wear knee-high stockings since they may decrease blood flow to your legs.  Wear shoes that fit properly and have enough cushioning. To break in new shoes, wear them for just a few hours a day. This prevents you from injuring your feet. Always look in your shoes before you put them on to be sure there are no objects inside.  Do not cross your legs. This may decrease the blood flow to your feet.  If you find a minor scrape, cut, or break in the skin on your feet, keep it and the skin around it clean and dry. These areas may be cleansed with mild soap and water. Do not cleanse the area with peroxide, alcohol, or iodine.  When you remove an adhesive bandage, be sure not to damage the skin around it.  If you have a wound, look at it several times a day to make sure it is healing.  Do not use heating pads  or hot water bottles. They may burn your skin. If you have lost feeling in your feet or legs, you may not know it is happening until it is too late.  Make sure your health care provider performs a complete foot exam at least annually or more often if you have foot problems. Report any cuts, sores, or bruises to your health care provider immediately. SEEK MEDICAL CARE IF:   You have an injury that is not healing.  You have cuts or breaks in the skin.  You have an ingrown nail.  You notice redness on your legs or feet.  You feel burning or tingling in your legs or feet.  You have pain or cramps in your legs and feet.  Your legs or feet are numb.  Your feet always feel cold. SEEK IMMEDIATE  MEDICAL CARE IF:   There is increasing redness, swelling, or pain in or around a wound.  There is a red line that goes up your leg.  Pus is coming from a wound.  You develop a fever or as directed by your health care provider.  You notice a bad smell coming from an ulcer or wound. Document Released: 01/01/2000 Document Revised: 09/05/2012 Document Reviewed: 06/12/2012 [facility] Patient Information 2014 [place], [place].

## 2013-02-05 ENCOUNTER — Telehealth: Payer: Self-pay | Admitting: *Deleted

## 2013-02-05 NOTE — Telephone Encounter (Signed)
[name], RN, HHN from [facility] to ask if pt can have home health nursing for wound care, PT was ordered only when pt was disch. Please advise

## 2013-02-06 ENCOUNTER — Encounter (HOSPITAL_BASED_OUTPATIENT_CLINIC_OR_DEPARTMENT_OTHER): Payer: Medicare Other | Attending: General Surgery

## 2013-02-06 DIAGNOSIS — L97409 Non-pressure chronic ulcer of unspecified heel and midfoot with unspecified severity: Secondary | ICD-10-CM | POA: Insufficient documentation

## 2013-02-06 DIAGNOSIS — E1169 Type 2 diabetes mellitus with other specified complication: Secondary | ICD-10-CM | POA: Diagnosis not present

## 2013-02-06 DIAGNOSIS — I10 Essential (primary) hypertension: Secondary | ICD-10-CM | POA: Diagnosis not present

## 2013-02-06 DIAGNOSIS — E1149 Type 2 diabetes mellitus with other diabetic neurological complication: Secondary | ICD-10-CM | POA: Insufficient documentation

## 2013-02-06 NOTE — Telephone Encounter (Signed)
Message copied by Rocco Pauls on Wed Feb 06, 2013 11:08 AM ------      Message from: Bobbye Charleston      Created: Tue Feb 05, 2013  8:47 PM       Yes it will be optimal for her to have home health RN for wound care. Thank you, CK ------

## 2013-02-06 NOTE — Telephone Encounter (Signed)
i spoke w/ [name] and to advanced referral for nursing, [name] will notify wound care ctr and ask for instructions for doing drsg changes and will send copies to clinic for dr [facility] review

## 2013-02-07 NOTE — Progress Notes (Signed)
[facility]  NAME:  [name], [name]              ACCOUNT NO.:  [id number]  MEDICAL RECORD NO.:  [id number]      DATE OF BIRTH:  April 27, 1955  PHYSICIAN:  Ardath Sax, M.D.           VISIT DATE:                                  OFFICE VISIT   Note on return of an old patient who has not been here for quite a while.  This is a 58 year old African American female who has had problems with diabetic ulcers on the plantar aspect of her left heel. She returns with recurrence of this ulcer that is about 1 cm in diameter right on the middle of her heel.  She has a Charcot joint and this has been troubling her for quite some time.  In the past, it has been healed with cast and hyperbaric oxygen and various types of dressings including Dermagraft.  She today was found to have a blood pressure of 126/84, respirations 16, pulse 85, temperature 98.  She is obese at 250 pounds.  Her medicines are vitamins and iron and calcium.  She takes lisinopril and Protonix, __________ and she is on clindamycin at the present time given to her by her doctor.  She also was noted to have a blood sugar of 139 today.  Other than that, she is alert and very active, very alert as she works every day, and I spoke with her about various ways to treat this, and today I am going to put on some silver collagen and we will offload her with a boot and we will put some felt around the heel and we will see about a Dermagraft later on, and she will return here in a week.  DIAGNOSES:  Type 2 diabetes, diabetic ulcer, left heel, and history of hypertension.     Ardath Sax, M.D.     PP/MEDQ  D:  02/06/2013  T:  02/07/2013  Job:  [id number]

## 2013-02-09 DIAGNOSIS — E1169 Type 2 diabetes mellitus with other specified complication: Secondary | ICD-10-CM | POA: Diagnosis not present

## 2013-02-09 DIAGNOSIS — L97509 Non-pressure chronic ulcer of other part of unspecified foot with unspecified severity: Secondary | ICD-10-CM | POA: Diagnosis not present

## 2013-02-09 DIAGNOSIS — N183 Chronic kidney disease, stage 3 unspecified: Secondary | ICD-10-CM | POA: Diagnosis not present

## 2013-02-09 DIAGNOSIS — G4730 Sleep apnea, unspecified: Secondary | ICD-10-CM | POA: Diagnosis not present

## 2013-02-09 DIAGNOSIS — Z4800 Encounter for change or removal of nonsurgical wound dressing: Secondary | ICD-10-CM | POA: Diagnosis not present

## 2013-02-09 DIAGNOSIS — I129 Hypertensive chronic kidney disease with stage 1 through stage 4 chronic kidney disease, or unspecified chronic kidney disease: Secondary | ICD-10-CM | POA: Diagnosis not present

## 2013-02-09 DIAGNOSIS — D509 Iron deficiency anemia, unspecified: Secondary | ICD-10-CM | POA: Diagnosis not present

## 2013-02-11 ENCOUNTER — Ambulatory Visit: Payer: Medicare Other | Admitting: Podiatry

## 2013-02-11 DIAGNOSIS — D509 Iron deficiency anemia, unspecified: Secondary | ICD-10-CM | POA: Diagnosis not present

## 2013-02-11 DIAGNOSIS — L97509 Non-pressure chronic ulcer of other part of unspecified foot with unspecified severity: Secondary | ICD-10-CM | POA: Diagnosis not present

## 2013-02-11 DIAGNOSIS — I129 Hypertensive chronic kidney disease with stage 1 through stage 4 chronic kidney disease, or unspecified chronic kidney disease: Secondary | ICD-10-CM | POA: Diagnosis not present

## 2013-02-11 DIAGNOSIS — E1169 Type 2 diabetes mellitus with other specified complication: Secondary | ICD-10-CM | POA: Diagnosis not present

## 2013-02-11 DIAGNOSIS — N183 Chronic kidney disease, stage 3 unspecified: Secondary | ICD-10-CM | POA: Diagnosis not present

## 2013-02-11 DIAGNOSIS — G4730 Sleep apnea, unspecified: Secondary | ICD-10-CM | POA: Diagnosis not present

## 2013-02-12 DIAGNOSIS — N183 Chronic kidney disease, stage 3 unspecified: Secondary | ICD-10-CM | POA: Diagnosis not present

## 2013-02-12 DIAGNOSIS — I129 Hypertensive chronic kidney disease with stage 1 through stage 4 chronic kidney disease, or unspecified chronic kidney disease: Secondary | ICD-10-CM | POA: Diagnosis not present

## 2013-02-12 DIAGNOSIS — G4730 Sleep apnea, unspecified: Secondary | ICD-10-CM | POA: Diagnosis not present

## 2013-02-12 DIAGNOSIS — D509 Iron deficiency anemia, unspecified: Secondary | ICD-10-CM | POA: Diagnosis not present

## 2013-02-12 DIAGNOSIS — E1169 Type 2 diabetes mellitus with other specified complication: Secondary | ICD-10-CM | POA: Diagnosis not present

## 2013-02-12 DIAGNOSIS — L97509 Non-pressure chronic ulcer of other part of unspecified foot with unspecified severity: Secondary | ICD-10-CM | POA: Diagnosis not present

## 2013-02-13 ENCOUNTER — Ambulatory Visit: Payer: Medicare Other | Admitting: Podiatrist

## 2013-02-13 ENCOUNTER — Telehealth: Payer: Self-pay | Admitting: *Deleted

## 2013-02-13 DIAGNOSIS — L97409 Non-pressure chronic ulcer of unspecified heel and midfoot with unspecified severity: Secondary | ICD-10-CM | POA: Diagnosis not present

## 2013-02-13 DIAGNOSIS — I10 Essential (primary) hypertension: Secondary | ICD-10-CM | POA: Diagnosis not present

## 2013-02-13 DIAGNOSIS — E1149 Type 2 diabetes mellitus with other diabetic neurological complication: Secondary | ICD-10-CM | POA: Diagnosis not present

## 2013-02-13 DIAGNOSIS — E1169 Type 2 diabetes mellitus with other specified complication: Secondary | ICD-10-CM | POA: Diagnosis not present

## 2013-02-13 NOTE — Telephone Encounter (Signed)
Call from pt - requesting another note for her job; pt's supervisor need more info. Requesting note to be written on [place] letterhead and need approx (time) when pt may return to work i.e. Within 3 months. Otherwise, pt states she will not get paid. Needs letter by tomorrow. Thanks

## 2013-02-13 NOTE — Telephone Encounter (Signed)
It is hard to give her a final time for her to return to work as it pends what kind of work she does and wound care and PT follow up.  I will discuss with attending on the issue as well, but not sure what more information we can provide unless she returns for hospital follow up and is reassesed at that time.  I did give her a note on [facility] letter head prior to leaving the hospital at that time.

## 2013-02-13 NOTE — Telephone Encounter (Signed)
Patient is to be seen with Dr. Burtis Junes tomorrow who may be able to assess her current mobility after she has worked with PT and has seen wound care since admission.  Asked RN to remind patient of appointment 1/29.  Will also forward this to Dr. Burtis Junes

## 2013-02-14 ENCOUNTER — Ambulatory Visit (INDEPENDENT_AMBULATORY_CARE_PROVIDER_SITE_OTHER): Payer: Medicare Other | Admitting: Internal Medicine

## 2013-02-14 ENCOUNTER — Encounter: Payer: Self-pay | Admitting: Internal Medicine

## 2013-02-14 VITALS — BP 135/77 | HR 80 | Temp 97.6°F | Ht 69.0 in | Wt 256.4 lb

## 2013-02-14 DIAGNOSIS — E119 Type 2 diabetes mellitus without complications: Secondary | ICD-10-CM | POA: Diagnosis not present

## 2013-02-14 DIAGNOSIS — I129 Hypertensive chronic kidney disease with stage 1 through stage 4 chronic kidney disease, or unspecified chronic kidney disease: Secondary | ICD-10-CM | POA: Diagnosis not present

## 2013-02-14 DIAGNOSIS — I10 Essential (primary) hypertension: Secondary | ICD-10-CM

## 2013-02-14 DIAGNOSIS — E1169 Type 2 diabetes mellitus with other specified complication: Secondary | ICD-10-CM | POA: Diagnosis not present

## 2013-02-14 DIAGNOSIS — D509 Iron deficiency anemia, unspecified: Secondary | ICD-10-CM | POA: Diagnosis not present

## 2013-02-14 DIAGNOSIS — N183 Chronic kidney disease, stage 3 unspecified: Secondary | ICD-10-CM | POA: Diagnosis not present

## 2013-02-14 DIAGNOSIS — L97509 Non-pressure chronic ulcer of other part of unspecified foot with unspecified severity: Secondary | ICD-10-CM

## 2013-02-14 DIAGNOSIS — G4730 Sleep apnea, unspecified: Secondary | ICD-10-CM | POA: Diagnosis not present

## 2013-02-14 DIAGNOSIS — Z09 Encounter for follow-up examination after completed treatment for conditions other than malignant neoplasm: Secondary | ICD-10-CM

## 2013-02-14 DIAGNOSIS — L97529 Non-pressure chronic ulcer of other part of left foot with unspecified severity: Secondary | ICD-10-CM

## 2013-02-14 LAB — BASIC METABOLIC PANEL
BUN: 33 mg/dL — ABNORMAL HIGH (ref 6–23)
CO2: 29 meq/L (ref 19–32)
Calcium: 9.4 mg/dL (ref 8.4–10.5)
Chloride: 101 mEq/L (ref 96–112)
Creat: 1.42 mg/dL — ABNORMAL HIGH (ref 0.50–1.10)
GLUCOSE: 86 mg/dL (ref 70–99)
POTASSIUM: 4.9 meq/L (ref 3.5–5.3)
SODIUM: 136 meq/L (ref 135–145)

## 2013-02-14 LAB — GLUCOSE, CAPILLARY: Glucose-Capillary: 93 mg/dL (ref 70–99)

## 2013-02-14 NOTE — Progress Notes (Signed)
   Subjective:    Patient ID: [name], female    DOB: 02-Jan-1956, 58 y.o.   MRN: [id number]  HPI [name] is a 58 yo woman pmh as listed below presents for hsopital follow up.   Pt was discharged on 02/02/13 for L plantar ulcer s/p debridement, OM was ruled out by MRI and ortho recommended a post-op boot to help with PT. Pt was set to complete a 7 day course of levaquin and clindamycin which she states she has completed. She was also working with wound care and physical therapy that has helped her to ambulate with a walker intermittent now at this time. She recently saw Dr. Jimmey Ralph with wound care that recommended continued decreased weight bearing and PT and daily wound changes. She is able to bear weight and ambulate still with some discomfort. She has not noticed any active drainage or new pain or expansion of her wound during dressing changes.  In terms of her diabetes she has been making some drastic lifestyle modifications in terms of medication adherence and dietary changes. She has been checking her blood sugars and maintaining excellent control. Review of her cbg readings show max of 229, avrg 116, and low of 80.    Review of Systems  Constitutional: Positive for activity change (has been able to slowly increase weight bearing slowly using walker). Negative for fever, chills, appetite change, fatigue and unexpected weight change.  Respiratory: Negative for cough and shortness of breath.   Cardiovascular: Negative for chest pain, palpitations and leg swelling.  Gastrointestinal: Negative for nausea, vomiting, abdominal pain, diarrhea, constipation and abdominal distention.  Musculoskeletal: Positive for arthralgias (only minimal left foot). Negative for back pain and myalgias.  Skin: Positive for wound (left plantar). Negative for color change and rash.  Neurological: Negative for dizziness, syncope, light-headedness and headaches.    Past Medical History  Diagnosis Date  .  Normocytic anemia   . Hypertension   . Foot ulcer, left   . Leg edema   . Glaucoma   . GERD (gastroesophageal reflux disease)   . High cholesterol   . Type II diabetes mellitus   . OSA on CPAP   . H/O hiatal hernia   . Arthritis     "right leg" (01/30/2013)  . CKD (chronic kidney disease), stage III     /notes 01/30/2013   Social, surgical, family history reviewed with patient and updated in appropriate chart locations.     Objective:   Physical Exam Filed Vitals:   02/14/13 1402  BP: 135/77  Pulse: 80  Temp: 97.6 F (36.4 C)   General: sitting in chair, NAD HEENT: PERRL, EOMI, no scleral icterus Cardiac: RRR, no rubs, murmurs or gallops Pulm: clear to auscultation bilaterally, no crackles, wheezes, or rhonchi, moving normal volumes of air Abd: soft, nontender, nondistended, BS present Ext: warm and well perfused, no pedal edema, left leg in unna boot Neuro: alert and oriented X3, cranial nerves II-XII grossly intact    Assessment & Plan:  Please see problem oriented charting  Pt discussed with Dr. Rogelia Boga

## 2013-02-14 NOTE — Patient Instructions (Signed)
Keep up the excellent work and no new changes need to be made to your medicine today!   Have a happy new year.

## 2013-02-15 DIAGNOSIS — I129 Hypertensive chronic kidney disease with stage 1 through stage 4 chronic kidney disease, or unspecified chronic kidney disease: Secondary | ICD-10-CM | POA: Diagnosis not present

## 2013-02-15 DIAGNOSIS — E1169 Type 2 diabetes mellitus with other specified complication: Secondary | ICD-10-CM | POA: Diagnosis not present

## 2013-02-15 DIAGNOSIS — N183 Chronic kidney disease, stage 3 unspecified: Secondary | ICD-10-CM | POA: Diagnosis not present

## 2013-02-15 DIAGNOSIS — D509 Iron deficiency anemia, unspecified: Secondary | ICD-10-CM | POA: Diagnosis not present

## 2013-02-15 DIAGNOSIS — G4730 Sleep apnea, unspecified: Secondary | ICD-10-CM | POA: Diagnosis not present

## 2013-02-15 DIAGNOSIS — L97509 Non-pressure chronic ulcer of other part of unspecified foot with unspecified severity: Secondary | ICD-10-CM | POA: Diagnosis not present

## 2013-02-15 NOTE — Assessment & Plan Note (Signed)
Pt had some AKI in hospital but down trended to patients baseline.  -recheck bmet -need nephrology referral but pt deferred at this time

## 2013-02-15 NOTE — Assessment & Plan Note (Signed)
Lab Results  Component Value Date   HGBA1C 6.8* 01/30/2013   Pt has good control and making good lifestyle changes. Per chart review pts Cr is ~1.4/1.5 and therefore decreases efficacy of metformin.  -would d/c metformin  -cont sitagliptin  -recheck in 1 month  -may need to add sulfonurea for glycemic control but in setting of new dietary changes pt maybe able to still maintain good control

## 2013-02-15 NOTE — Assessment & Plan Note (Signed)
Pt s/p debridement and completion of 7 day course of Abx. OM was ruled out by MRI during hospitalization. Continues to improve and do well. Pt continues to have HH wound care and evaluation with Dr. Jimmey Ralph.  -continue PT -work letter was provided for the patient -no further need for continued Abx as pt has no systemic signs or symptoms of infection

## 2013-02-15 NOTE — Assessment & Plan Note (Signed)
BP Readings from Last 3 Encounters:  02/14/13 135/77  02/02/13 125/67  01/30/13 133/78   Pt has good control. Will continue current management.

## 2013-02-18 DIAGNOSIS — D509 Iron deficiency anemia, unspecified: Secondary | ICD-10-CM | POA: Diagnosis not present

## 2013-02-18 DIAGNOSIS — G4730 Sleep apnea, unspecified: Secondary | ICD-10-CM | POA: Diagnosis not present

## 2013-02-18 DIAGNOSIS — I129 Hypertensive chronic kidney disease with stage 1 through stage 4 chronic kidney disease, or unspecified chronic kidney disease: Secondary | ICD-10-CM | POA: Diagnosis not present

## 2013-02-18 DIAGNOSIS — E1169 Type 2 diabetes mellitus with other specified complication: Secondary | ICD-10-CM | POA: Diagnosis not present

## 2013-02-18 DIAGNOSIS — N183 Chronic kidney disease, stage 3 unspecified: Secondary | ICD-10-CM | POA: Diagnosis not present

## 2013-02-18 DIAGNOSIS — L97509 Non-pressure chronic ulcer of other part of unspecified foot with unspecified severity: Secondary | ICD-10-CM | POA: Diagnosis not present

## 2013-02-18 NOTE — Progress Notes (Signed)
Case discussed with Dr. Sadek soon after the resident saw the patient.  We reviewed the resident's history and exam and pertinent patient test results.  I agree with the assessment, diagnosis, and plan of care documented in the resident's note. 

## 2013-02-19 DIAGNOSIS — L97509 Non-pressure chronic ulcer of other part of unspecified foot with unspecified severity: Secondary | ICD-10-CM | POA: Diagnosis not present

## 2013-02-19 DIAGNOSIS — E1169 Type 2 diabetes mellitus with other specified complication: Secondary | ICD-10-CM | POA: Diagnosis not present

## 2013-02-19 DIAGNOSIS — G4730 Sleep apnea, unspecified: Secondary | ICD-10-CM | POA: Diagnosis not present

## 2013-02-19 DIAGNOSIS — D509 Iron deficiency anemia, unspecified: Secondary | ICD-10-CM | POA: Diagnosis not present

## 2013-02-19 DIAGNOSIS — I129 Hypertensive chronic kidney disease with stage 1 through stage 4 chronic kidney disease, or unspecified chronic kidney disease: Secondary | ICD-10-CM | POA: Diagnosis not present

## 2013-02-19 DIAGNOSIS — N183 Chronic kidney disease, stage 3 unspecified: Secondary | ICD-10-CM | POA: Diagnosis not present

## 2013-02-20 ENCOUNTER — Encounter (HOSPITAL_BASED_OUTPATIENT_CLINIC_OR_DEPARTMENT_OTHER): Payer: Medicare Other | Attending: General Surgery

## 2013-02-20 DIAGNOSIS — E1169 Type 2 diabetes mellitus with other specified complication: Secondary | ICD-10-CM | POA: Insufficient documentation

## 2013-02-20 DIAGNOSIS — L97409 Non-pressure chronic ulcer of unspecified heel and midfoot with unspecified severity: Secondary | ICD-10-CM | POA: Insufficient documentation

## 2013-02-21 DIAGNOSIS — G4730 Sleep apnea, unspecified: Secondary | ICD-10-CM | POA: Diagnosis not present

## 2013-02-21 DIAGNOSIS — L97509 Non-pressure chronic ulcer of other part of unspecified foot with unspecified severity: Secondary | ICD-10-CM | POA: Diagnosis not present

## 2013-02-21 DIAGNOSIS — N183 Chronic kidney disease, stage 3 unspecified: Secondary | ICD-10-CM | POA: Diagnosis not present

## 2013-02-21 DIAGNOSIS — D509 Iron deficiency anemia, unspecified: Secondary | ICD-10-CM | POA: Diagnosis not present

## 2013-02-21 DIAGNOSIS — I129 Hypertensive chronic kidney disease with stage 1 through stage 4 chronic kidney disease, or unspecified chronic kidney disease: Secondary | ICD-10-CM | POA: Diagnosis not present

## 2013-02-21 DIAGNOSIS — E1169 Type 2 diabetes mellitus with other specified complication: Secondary | ICD-10-CM | POA: Diagnosis not present

## 2013-02-22 DIAGNOSIS — E1169 Type 2 diabetes mellitus with other specified complication: Secondary | ICD-10-CM | POA: Diagnosis not present

## 2013-02-22 DIAGNOSIS — D509 Iron deficiency anemia, unspecified: Secondary | ICD-10-CM | POA: Diagnosis not present

## 2013-02-22 DIAGNOSIS — L97409 Non-pressure chronic ulcer of unspecified heel and midfoot with unspecified severity: Secondary | ICD-10-CM | POA: Diagnosis not present

## 2013-02-22 DIAGNOSIS — I129 Hypertensive chronic kidney disease with stage 1 through stage 4 chronic kidney disease, or unspecified chronic kidney disease: Secondary | ICD-10-CM | POA: Diagnosis not present

## 2013-02-22 DIAGNOSIS — N183 Chronic kidney disease, stage 3 unspecified: Secondary | ICD-10-CM | POA: Diagnosis not present

## 2013-02-22 DIAGNOSIS — G4730 Sleep apnea, unspecified: Secondary | ICD-10-CM | POA: Diagnosis not present

## 2013-02-22 DIAGNOSIS — L97509 Non-pressure chronic ulcer of other part of unspecified foot with unspecified severity: Secondary | ICD-10-CM | POA: Diagnosis not present

## 2013-02-26 DIAGNOSIS — E1169 Type 2 diabetes mellitus with other specified complication: Secondary | ICD-10-CM | POA: Diagnosis not present

## 2013-02-26 DIAGNOSIS — I129 Hypertensive chronic kidney disease with stage 1 through stage 4 chronic kidney disease, or unspecified chronic kidney disease: Secondary | ICD-10-CM | POA: Diagnosis not present

## 2013-02-26 DIAGNOSIS — N183 Chronic kidney disease, stage 3 unspecified: Secondary | ICD-10-CM | POA: Diagnosis not present

## 2013-02-26 DIAGNOSIS — L97509 Non-pressure chronic ulcer of other part of unspecified foot with unspecified severity: Secondary | ICD-10-CM | POA: Diagnosis not present

## 2013-02-26 DIAGNOSIS — G4730 Sleep apnea, unspecified: Secondary | ICD-10-CM | POA: Diagnosis not present

## 2013-02-26 DIAGNOSIS — D509 Iron deficiency anemia, unspecified: Secondary | ICD-10-CM | POA: Diagnosis not present

## 2013-02-27 DIAGNOSIS — E1169 Type 2 diabetes mellitus with other specified complication: Secondary | ICD-10-CM | POA: Diagnosis not present

## 2013-02-27 DIAGNOSIS — L97409 Non-pressure chronic ulcer of unspecified heel and midfoot with unspecified severity: Secondary | ICD-10-CM | POA: Diagnosis not present

## 2013-02-28 ENCOUNTER — Other Ambulatory Visit: Payer: Self-pay | Admitting: *Deleted

## 2013-02-28 DIAGNOSIS — L97509 Non-pressure chronic ulcer of other part of unspecified foot with unspecified severity: Secondary | ICD-10-CM | POA: Diagnosis not present

## 2013-02-28 DIAGNOSIS — I129 Hypertensive chronic kidney disease with stage 1 through stage 4 chronic kidney disease, or unspecified chronic kidney disease: Secondary | ICD-10-CM | POA: Diagnosis not present

## 2013-02-28 DIAGNOSIS — N183 Chronic kidney disease, stage 3 unspecified: Secondary | ICD-10-CM | POA: Diagnosis not present

## 2013-02-28 DIAGNOSIS — G4730 Sleep apnea, unspecified: Secondary | ICD-10-CM | POA: Diagnosis not present

## 2013-02-28 DIAGNOSIS — D509 Iron deficiency anemia, unspecified: Secondary | ICD-10-CM | POA: Diagnosis not present

## 2013-02-28 DIAGNOSIS — E1169 Type 2 diabetes mellitus with other specified complication: Secondary | ICD-10-CM | POA: Diagnosis not present

## 2013-02-28 MED ORDER — FUROSEMIDE 40 MG PO TABS: 40.0000 mg | ORAL_TABLET | Freq: Every day | ORAL | Status: DC

## 2013-03-06 DIAGNOSIS — E1169 Type 2 diabetes mellitus with other specified complication: Secondary | ICD-10-CM | POA: Diagnosis not present

## 2013-03-06 DIAGNOSIS — L97409 Non-pressure chronic ulcer of unspecified heel and midfoot with unspecified severity: Secondary | ICD-10-CM | POA: Diagnosis not present

## 2013-03-13 DIAGNOSIS — E1169 Type 2 diabetes mellitus with other specified complication: Secondary | ICD-10-CM | POA: Diagnosis not present

## 2013-03-13 DIAGNOSIS — L97409 Non-pressure chronic ulcer of unspecified heel and midfoot with unspecified severity: Secondary | ICD-10-CM | POA: Diagnosis not present

## 2013-03-20 ENCOUNTER — Encounter (HOSPITAL_BASED_OUTPATIENT_CLINIC_OR_DEPARTMENT_OTHER): Payer: Medicare Other | Attending: General Surgery

## 2013-03-20 DIAGNOSIS — L84 Corns and callosities: Secondary | ICD-10-CM | POA: Insufficient documentation

## 2013-03-20 DIAGNOSIS — Z7982 Long term (current) use of aspirin: Secondary | ICD-10-CM | POA: Diagnosis not present

## 2013-03-20 DIAGNOSIS — E1169 Type 2 diabetes mellitus with other specified complication: Secondary | ICD-10-CM | POA: Insufficient documentation

## 2013-03-20 DIAGNOSIS — I10 Essential (primary) hypertension: Secondary | ICD-10-CM | POA: Diagnosis not present

## 2013-03-20 DIAGNOSIS — E1149 Type 2 diabetes mellitus with other diabetic neurological complication: Secondary | ICD-10-CM | POA: Insufficient documentation

## 2013-03-20 DIAGNOSIS — L97509 Non-pressure chronic ulcer of other part of unspecified foot with unspecified severity: Secondary | ICD-10-CM | POA: Diagnosis not present

## 2013-03-20 DIAGNOSIS — Z79899 Other long term (current) drug therapy: Secondary | ICD-10-CM | POA: Insufficient documentation

## 2013-03-26 DIAGNOSIS — H4011X0 Primary open-angle glaucoma, stage unspecified: Secondary | ICD-10-CM | POA: Diagnosis not present

## 2013-03-26 DIAGNOSIS — E119 Type 2 diabetes mellitus without complications: Secondary | ICD-10-CM | POA: Diagnosis not present

## 2013-04-02 ENCOUNTER — Other Ambulatory Visit: Payer: Self-pay | Admitting: *Deleted

## 2013-04-02 MED ORDER — METFORMIN HCL 1000 MG PO TABS: 1000.0000 mg | ORAL_TABLET | Freq: Two times a day (BID) | ORAL | Status: DC

## 2013-04-04 ENCOUNTER — Encounter: Payer: Self-pay | Admitting: Internal Medicine

## 2013-04-04 ENCOUNTER — Telehealth: Payer: Self-pay | Admitting: *Deleted

## 2013-04-04 ENCOUNTER — Ambulatory Visit (INDEPENDENT_AMBULATORY_CARE_PROVIDER_SITE_OTHER): Payer: Medicare Other | Admitting: Internal Medicine

## 2013-04-04 VITALS — BP 107/68 | HR 55 | Temp 97.2°F | Ht 69.0 in | Wt 258.8 lb

## 2013-04-04 DIAGNOSIS — L97509 Non-pressure chronic ulcer of other part of unspecified foot with unspecified severity: Secondary | ICD-10-CM | POA: Diagnosis not present

## 2013-04-04 DIAGNOSIS — E1169 Type 2 diabetes mellitus with other specified complication: Secondary | ICD-10-CM | POA: Diagnosis not present

## 2013-04-04 DIAGNOSIS — E119 Type 2 diabetes mellitus without complications: Secondary | ICD-10-CM

## 2013-04-04 DIAGNOSIS — M25559 Pain in unspecified hip: Secondary | ICD-10-CM | POA: Diagnosis not present

## 2013-04-04 DIAGNOSIS — N183 Chronic kidney disease, stage 3 unspecified: Secondary | ICD-10-CM | POA: Diagnosis not present

## 2013-04-04 DIAGNOSIS — I129 Hypertensive chronic kidney disease with stage 1 through stage 4 chronic kidney disease, or unspecified chronic kidney disease: Secondary | ICD-10-CM | POA: Diagnosis not present

## 2013-04-04 DIAGNOSIS — L97529 Non-pressure chronic ulcer of other part of left foot with unspecified severity: Secondary | ICD-10-CM

## 2013-04-04 LAB — GLUCOSE, CAPILLARY: Glucose-Capillary: 125 mg/dL — ABNORMAL HIGH (ref 70–99)

## 2013-04-04 NOTE — Assessment & Plan Note (Signed)
Healing well, followed by Wound Care clinic, no bleeding or drainage noted. -keep f/u with Wound Care

## 2013-04-04 NOTE — Progress Notes (Signed)
   Subjective:    Patient ID: [name], female    DOB: [birth year], 58 y.o.   MRN: [id number]  HPI  Pt with hx of diabetic foot ulcer followed by Wound Care presents with complaints of "bleeding" from her foot. States that when she unwrapped the bandages upon waking this morning, that she noticed some blood on the bandages.  No further bleeding, drainage, odor, fever, or pain. Has appt with Wound Care "next Friday"   Review of Systems  Constitutional: Negative.   HENT: Negative.   Eyes: Negative.   Respiratory: Negative.   Cardiovascular: Negative.   Gastrointestinal: Negative.   Genitourinary: Negative.   Musculoskeletal:       Foot ulcer  Skin: Positive for wound.       Foot ulcer with bleeding this morning  Neurological: Negative.   Hematological: Does not bruise/bleed easily.  Psychiatric/Behavioral: Negative.        Objective:   Physical Exam  Constitutional: She is oriented to person, place, and time. She appears well-developed and well-nourished. No distress.  HENT:  Head: Normocephalic and atraumatic.  Eyes: Conjunctivae and EOM are normal. Pupils are equal, round, and reactive to light.  Cardiovascular: Normal rate.   Pulmonary/Chest: Effort normal.  Abdominal: She exhibits no distension.  Musculoskeletal: She exhibits no edema and no tenderness.       Left foot: She exhibits laceration. She exhibits no tenderness and no swelling.       Feet:  Neurological: She is alert and oriented to person, place, and time.  Skin: Skin is warm and dry.  Psychiatric: She has a normal mood and affect.          Assessment & Plan:  See separate problem-list charting:

## 2013-04-04 NOTE — Telephone Encounter (Signed)
Pt called - has been seeing Dr Jimmey Ralph at the [facility] for left foot. Last seen 2 weeks ago at [facility] Center. Unable to see pt - has new wound on side left foot and is bleeding since AM. Appt made for today 1:45PM Dr Bosie Clos. Stanton Kidney Wetona Viramontes RN 04/04/13 10:30AM

## 2013-04-04 NOTE — Patient Instructions (Signed)
It was nice to meet you, [name]. Your foot wound is healing well. There was a small area where the skin is flaking off near the wound. It is no longer bleeding and does not appear infected. Continue to care for it as advised by the Wound Care Center and keep your schedule appointment.  Please bring your medicines with you each time you come.   Medicines may be  Eye drops  Herbal   Vitamins  Pills  Seeing these help [place] take care of you.

## 2013-04-05 NOTE — Progress Notes (Signed)
Case discussed with Dr. Schooler soon after the resident saw the patient.  We reviewed the resident's history and exam and pertinent patient test results.  I agree with the assessment, diagnosis, and plan of care documented in the resident's note. 

## 2013-04-12 ENCOUNTER — Other Ambulatory Visit: Payer: Self-pay | Admitting: Internal Medicine

## 2013-04-12 DIAGNOSIS — L97509 Non-pressure chronic ulcer of other part of unspecified foot with unspecified severity: Secondary | ICD-10-CM | POA: Diagnosis not present

## 2013-04-12 DIAGNOSIS — E1169 Type 2 diabetes mellitus with other specified complication: Secondary | ICD-10-CM | POA: Diagnosis not present

## 2013-04-12 DIAGNOSIS — E1149 Type 2 diabetes mellitus with other diabetic neurological complication: Secondary | ICD-10-CM | POA: Diagnosis not present

## 2013-04-15 DIAGNOSIS — E1169 Type 2 diabetes mellitus with other specified complication: Secondary | ICD-10-CM | POA: Diagnosis not present

## 2013-04-15 DIAGNOSIS — L97509 Non-pressure chronic ulcer of other part of unspecified foot with unspecified severity: Secondary | ICD-10-CM | POA: Diagnosis not present

## 2013-04-15 DIAGNOSIS — E1149 Type 2 diabetes mellitus with other diabetic neurological complication: Secondary | ICD-10-CM | POA: Diagnosis not present

## 2013-04-15 NOTE — Progress Notes (Signed)
[facility]  NAME:  [facility], [name]                   ACCOUNT NO.:  MEDICAL RECORD NO.:  [id number]      DATE OF BIRTH:  1955-04-07  PHYSICIAN:  Ardath Sax, M.D.           VISIT DATE:                                  OFFICE VISIT   This is a 58 year old African American female who is a diabetic, we have seen here before for a diabetic foot ulcer on the plantar aspect of her left foot.  She of course has type 2 diabetes, she has hypertension and we have treated her before with a collagen and easy cast.  She has a long list of medicines including vitamins, Tylenol, aspirin, calcium, ferrous sulfate, lisinopril, metformin, Januvia, and apparently her doctor put her on clindamycin and Levaquin.  I debrided the callus off this plantar left foot ulcer.  It is about a centimeter in diameter. She has an obvious Charcot's foot, and we are going to treat it again with an easy cast with offloading and some silver collagen.  The patient states that she has been wearing diabetic shoes, but got a recurrence of the ulcer regardless.  Her vital signs on this visit was a blood pressure of 126/79, pulse 80, temperature was 98.  Her blood glucose was 104.  This lady weighs 250 pounds, so her diagnosis is type 2 diabetes, Charcot's foot on the left with a plantar diabetic ulcer classified as Wagner 2.  She has a Charcot's foot.  Other diagnosis is hypertension.     Ardath Sax, M.D.     PP/MEDQ  D:  04/12/2013  T:  04/12/2013  Job:  [id number]

## 2013-04-16 ENCOUNTER — Ambulatory Visit (INDEPENDENT_AMBULATORY_CARE_PROVIDER_SITE_OTHER): Payer: Medicare Other | Admitting: Internal Medicine

## 2013-04-16 ENCOUNTER — Encounter: Payer: Self-pay | Admitting: Internal Medicine

## 2013-04-16 VITALS — BP 118/72 | HR 85 | Temp 98.1°F | Wt 260.3 lb

## 2013-04-16 DIAGNOSIS — L97509 Non-pressure chronic ulcer of other part of unspecified foot with unspecified severity: Secondary | ICD-10-CM | POA: Diagnosis not present

## 2013-04-16 DIAGNOSIS — E119 Type 2 diabetes mellitus without complications: Secondary | ICD-10-CM | POA: Diagnosis not present

## 2013-04-16 DIAGNOSIS — M25559 Pain in unspecified hip: Secondary | ICD-10-CM | POA: Diagnosis not present

## 2013-04-16 DIAGNOSIS — I10 Essential (primary) hypertension: Secondary | ICD-10-CM | POA: Diagnosis not present

## 2013-04-16 DIAGNOSIS — E1169 Type 2 diabetes mellitus with other specified complication: Secondary | ICD-10-CM | POA: Diagnosis not present

## 2013-04-16 DIAGNOSIS — N183 Chronic kidney disease, stage 3 unspecified: Secondary | ICD-10-CM | POA: Diagnosis not present

## 2013-04-16 DIAGNOSIS — I129 Hypertensive chronic kidney disease with stage 1 through stage 4 chronic kidney disease, or unspecified chronic kidney disease: Secondary | ICD-10-CM | POA: Diagnosis not present

## 2013-04-16 MED ORDER — METFORMIN HCL 1000 MG PO TABS: 1000.0000 mg | ORAL_TABLET | Freq: Two times a day (BID) | ORAL | Status: DC

## 2013-04-16 MED ORDER — FUROSEMIDE 40 MG PO TABS: 40.0000 mg | ORAL_TABLET | Freq: Every day | ORAL | Status: DC

## 2013-04-16 MED ORDER — LISINOPRIL 20 MG PO TABS: 20.0000 mg | ORAL_TABLET | Freq: Every day | ORAL | Status: DC

## 2013-04-16 MED ORDER — PANTOPRAZOLE SODIUM 40 MG PO TBEC: DELAYED_RELEASE_TABLET | ORAL | Status: DC

## 2013-04-16 MED ORDER — SITAGLIPTIN PHOSPHATE 100 MG PO TABS: 100.0000 mg | ORAL_TABLET | Freq: Every day | ORAL | Status: DC

## 2013-04-16 MED ORDER — FERROUS SULFATE 325 (65 FE) MG PO TABS: 325.0000 mg | ORAL_TABLET | Freq: Every day | ORAL | Status: DC

## 2013-04-16 NOTE — Progress Notes (Signed)
Patient ID: [name], female   DOB: 1955/06/02, 58 y.o.   MRN: [id number]   Subjective:   Patient ID: [name] female   DOB: [birth year] 58 y.o.   MRN: [id number]  HPI: [name] is a 58 y.o. woman who presents for routing f/u see problem based charting below.     Past Medical History  Diagnosis Date  . Normocytic anemia   . Hypertension   . Foot ulcer, left   . Leg edema   . Glaucoma   . GERD (gastroesophageal reflux disease)   . High cholesterol   . Type II diabetes mellitus   . OSA on CPAP   . H/O hiatal hernia   . Arthritis     "right leg" (01/30/2013)  . CKD (chronic kidney disease), stage III     /notes 01/30/2013   Current Outpatient Prescriptions  Medication Sig Dispense Refill  . acetaminophen (TYLENOL) 325 MG tablet Take 1 tablet (325 mg total) by mouth every 6 (six) hours as needed. For back pain.  30 tablet    . aspirin EC 81 MG tablet Take 81 mg by mouth daily.      . Calcium Carbonate-Vitamin D 600-400 MG-UNIT per chew tablet Chew 1 tablet by mouth 2 (two) times daily.  30 tablet  11  . ferrous sulfate 325 (65 FE) MG tablet Take 325 mg by mouth daily. With orange juice      . furosemide (LASIX) 40 MG tablet Take 1 tablet (40 mg total) by mouth daily.  30 tablet  6  . glucose blood (ACCU-CHEK AVIVA PLUS) test strip Use as directed for once daily glucose testing. Dx: ICD-9 250.00.  100 each  1  . lisinopril (PRINIVIL,ZESTRIL) 20 MG tablet Take 20 mg by mouth daily.      Marland Kitchen LUMIGAN 0.01 % SOLN Place 1 drop into both eyes every evening.      . metFORMIN (GLUCOPHAGE) 1000 MG tablet Take 1 tablet (1,000 mg total) by mouth 2 (two) times daily with a meal.  60 tablet  6  . Multiple Vitamin (MULTIVITAMIN) tablet Take 1 tablet by mouth daily.       . pantoprazole (PROTONIX) 40 MG tablet TAKE 1 TABLET BY MOUTH DAILY  90 tablet  4  . sitaGLIPtin (JANUVIA) 100 MG tablet Take 100 mg by mouth daily.      . [DISCONTINUED] pravastatin (PRAVACHOL) 20 MG  tablet Take 1 tablet (20 mg total) by mouth daily.  30 tablet  6   No current facility-administered medications for this visit.   Family History  Problem Relation Age of Onset  . Diabetes Mother   . Hypertension Mother   . Heart disease Father   . Stroke Father   . Diabetes Father   . Hypertension Father   . Gout Father   . Deep vein thrombosis Father    History   Social History  . Marital Status: Single    Spouse Name: N/A    Number of Children: N/A  . Years of Education: N/A   Social History Main Topics  . Smoking status: Former Smoker -- 0.50 packs/day for 4 years    Types: Cigarettes    Quit date: 12/22/1972  . Smokeless tobacco: Never Used  . Alcohol Use: No  . Drug Use: No  . Sexual Activity: None   Other Topics Concern  . 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   Smoked when she was a teenager   no alcohol   no Drugs      Financial assistance approved for 100% discount at [facility] and has [facility] card per Rudell Cobb   10/30/2009      She was just approved for Disability and is looking for an income controlled housing so she can move out of the shelter.  Living in a rent controlled apartment.    Review of Systems: Review of Systems  Constitutional: Negative for fever and chills.  HENT: Negative for congestion and sore throat.   Respiratory: Negative for cough, sputum production and shortness of breath.   Cardiovascular: Negative for chest pain, palpitations, orthopnea and leg swelling.  Gastrointestinal: Negative for nausea, vomiting, abdominal pain, diarrhea and constipation.  Genitourinary: Negative for dysuria, urgency and frequency.  Neurological: Negative for weakness and headaches.    Objective:  Physical Exam: Filed Vitals:   04/16/13 1322  BP: 118/72  Pulse: 85  Temp: 98.1 F (36.7 C)  TempSrc: Oral  Weight: 260 lb 4.8 oz (118.071 kg)  SpO2: 100%   Physical Exam    Constitutional: She is oriented to person, place, and time. She appears well-developed and well-nourished. No distress.  HENT:  Head: Normocephalic.  Mouth/Throat: Oropharynx is clear and moist. No oropharyngeal exudate.  Cardiovascular: Normal rate, regular rhythm, normal heart sounds and intact distal pulses.  Exam reveals no friction rub.   No murmur heard. Pulmonary/Chest: Effort normal and breath sounds normal. No respiratory distress. She has no wheezes. She has no rales.  Abdominal: Soft. Bowel sounds are normal. She exhibits no distension. There is no tenderness. There is no rebound and no guarding.  Musculoskeletal:  Boot on left foot over cast  Neurological: She is alert and oriented to person, place, and time.  Skin: She is not diaphoretic.  Cast over left foot  Psychiatric: She has a normal mood and affect. Her behavior is normal.     Assessment & Plan:

## 2013-04-16 NOTE — Assessment & Plan Note (Signed)
BP Readings from Last 3 Encounters:  04/16/13 118/72  04/04/13 107/68  02/14/13 135/77    Lab Results  Component Value Date   NA 136 02/14/2013   K 4.9 02/14/2013   CREATININE 1.42* 02/14/2013    Assessment: Blood pressure control: controlled Progress toward BP goal:  improved Comments: Patient is doing well on current meds. Her BP is well controlled.   Plan: Medications:  continue current medications Other plans: F/U in 2-3 months

## 2013-04-16 NOTE — Assessment & Plan Note (Signed)
Lab Results  Component Value Date   HGBA1C 6.8* 01/30/2013   HGBA1C 6.6 12/05/2012   HGBA1C 6.5 08/28/2012     Assessment: Diabetes control: good control (HgbA1C at goal) Progress toward A1C goal:  at goal Comments: Patient is doing well on current regimen. 100% of 54 readings were within goal range over the last month!  Plan: Medications:  continue current medications Home glucose monitoring: Frequency: once a day Timing: before breakfast;before dinner Instruction/counseling given: discussed foot care Other plans: F/U in 2-3 months.

## 2013-04-16 NOTE — Patient Instructions (Signed)
Keep up the great work!  Follow up with PCP in 2-3 months.

## 2013-04-16 NOTE — Progress Notes (Signed)
Case discussed with Dr. Komanski at the time of the visit.  We reviewed the resident's history and exam and pertinent patient test results.  I agree with the assessment, diagnosis, and plan of care documented in the resident's note.      

## 2013-04-19 ENCOUNTER — Encounter (HOSPITAL_BASED_OUTPATIENT_CLINIC_OR_DEPARTMENT_OTHER): Payer: Medicare Other | Attending: General Surgery

## 2013-04-19 DIAGNOSIS — E1169 Type 2 diabetes mellitus with other specified complication: Secondary | ICD-10-CM | POA: Insufficient documentation

## 2013-04-19 DIAGNOSIS — L97409 Non-pressure chronic ulcer of unspecified heel and midfoot with unspecified severity: Secondary | ICD-10-CM | POA: Insufficient documentation

## 2013-04-26 DIAGNOSIS — E1169 Type 2 diabetes mellitus with other specified complication: Secondary | ICD-10-CM | POA: Diagnosis not present

## 2013-04-26 DIAGNOSIS — L97409 Non-pressure chronic ulcer of unspecified heel and midfoot with unspecified severity: Secondary | ICD-10-CM | POA: Diagnosis not present

## 2013-05-03 DIAGNOSIS — E1169 Type 2 diabetes mellitus with other specified complication: Secondary | ICD-10-CM | POA: Diagnosis not present

## 2013-05-03 DIAGNOSIS — L97409 Non-pressure chronic ulcer of unspecified heel and midfoot with unspecified severity: Secondary | ICD-10-CM | POA: Diagnosis not present

## 2013-06-04 ENCOUNTER — Encounter: Payer: Self-pay | Admitting: Internal Medicine

## 2013-06-04 ENCOUNTER — Ambulatory Visit (INDEPENDENT_AMBULATORY_CARE_PROVIDER_SITE_OTHER): Payer: Medicare Other | Admitting: Internal Medicine

## 2013-06-04 VITALS — BP 116/75 | HR 75 | Temp 97.9°F | Wt 268.9 lb

## 2013-06-04 DIAGNOSIS — N183 Chronic kidney disease, stage 3 unspecified: Secondary | ICD-10-CM | POA: Diagnosis not present

## 2013-06-04 DIAGNOSIS — R609 Edema, unspecified: Secondary | ICD-10-CM

## 2013-06-04 DIAGNOSIS — E119 Type 2 diabetes mellitus without complications: Secondary | ICD-10-CM | POA: Diagnosis not present

## 2013-06-04 DIAGNOSIS — I10 Essential (primary) hypertension: Secondary | ICD-10-CM | POA: Diagnosis not present

## 2013-06-04 DIAGNOSIS — L97509 Non-pressure chronic ulcer of other part of unspecified foot with unspecified severity: Secondary | ICD-10-CM | POA: Diagnosis not present

## 2013-06-04 DIAGNOSIS — Z8639 Personal history of other endocrine, nutritional and metabolic disease: Secondary | ICD-10-CM

## 2013-06-04 DIAGNOSIS — I129 Hypertensive chronic kidney disease with stage 1 through stage 4 chronic kidney disease, or unspecified chronic kidney disease: Secondary | ICD-10-CM | POA: Diagnosis not present

## 2013-06-04 DIAGNOSIS — E1169 Type 2 diabetes mellitus with other specified complication: Secondary | ICD-10-CM | POA: Diagnosis not present

## 2013-06-04 DIAGNOSIS — Z862 Personal history of diseases of the blood and blood-forming organs and certain disorders involving the immune mechanism: Secondary | ICD-10-CM

## 2013-06-04 DIAGNOSIS — Z8631 Personal history of diabetic foot ulcer: Secondary | ICD-10-CM

## 2013-06-04 DIAGNOSIS — M25559 Pain in unspecified hip: Secondary | ICD-10-CM | POA: Diagnosis not present

## 2013-06-04 LAB — GLUCOSE, CAPILLARY: GLUCOSE-CAPILLARY: 122 mg/dL — AB (ref 70–99)

## 2013-06-04 LAB — POCT GLYCOSYLATED HEMOGLOBIN (HGB A1C): Hemoglobin A1C: 6.5

## 2013-06-04 NOTE — Progress Notes (Signed)
Patient ID: [name], female   DOB: 09-29-1955, 58 y.o.   MRN: [id number]    Subjective:   Patient ID: [name] female   DOB: 1955-09-10 58 y.o.   MRN: [id number]  HPI: [name] is a 58 y.o. F who presents for DM f/u and for forms for diabetic shoes. See problem based charting below.    Past Medical History  Diagnosis Date  . Normocytic anemia   . Hypertension   . Foot ulcer, left   . Leg edema   . Glaucoma   . GERD (gastroesophageal reflux disease)   . High cholesterol   . Type II diabetes mellitus   . OSA on CPAP   . H/O hiatal hernia   . Arthritis     "right leg" (01/30/2013)  . CKD (chronic kidney disease), stage III     /notes 01/30/2013   Current Outpatient Prescriptions  Medication Sig Dispense Refill  . acetaminophen (TYLENOL) 325 MG tablet Take 1 tablet (325 mg total) by mouth every 6 (six) hours as needed. For back pain.  30 tablet    . aspirin EC 81 MG tablet Take 81 mg by mouth daily.      . ferrous sulfate 325 (65 FE) MG tablet Take 1 tablet (325 mg total) by mouth daily. With orange juice  30 tablet  0  . furosemide (LASIX) 40 MG tablet Take 1 tablet (40 mg total) by mouth daily.  30 tablet  6  . glucose blood (ACCU-CHEK AVIVA PLUS) test strip Use as directed for once daily glucose testing. Dx: ICD-9 250.00.  100 each  1  . lisinopril (PRINIVIL,ZESTRIL) 20 MG tablet Take 1 tablet (20 mg total) by mouth daily.  30 tablet  3  . LUMIGAN 0.01 % SOLN Place 1 drop into both eyes every evening.      . metFORMIN (GLUCOPHAGE) 1000 MG tablet Take 1 tablet (1,000 mg total) by mouth 2 (two) times daily with a meal.  60 tablet  6  . Multiple Vitamin (MULTIVITAMIN) tablet Take 1 tablet by mouth daily.       . pantoprazole (PROTONIX) 40 MG tablet TAKE 1 TABLET BY MOUTH DAILY  90 tablet  4  . sitaGLIPtin (JANUVIA) 100 MG tablet Take 1 tablet (100 mg total) by mouth daily.  30 tablet  6  . Calcium Carbonate-Vitamin D 600-400 MG-UNIT per chew tablet Chew  1 tablet by mouth 2 (two) times daily.  30 tablet  11  . [DISCONTINUED] pravastatin (PRAVACHOL) 20 MG tablet Take 1 tablet (20 mg total) by mouth daily.  30 tablet  6   No current facility-administered medications for this visit.   Family History  Problem Relation Age of Onset  . Diabetes Mother   . Hypertension Mother   . Heart disease Father   . Stroke Father   . Diabetes Father   . Hypertension Father   . Gout Father   . Deep vein thrombosis Father    History   Social History  . Marital Status: Single    Spouse Name: N/A    Number of Children: N/A  . Years of Education: N/A   Social History Main Topics  . Smoking status: Former Smoker -- 0.50 packs/day for 4 years    Types: Cigarettes    Quit date: 12/22/1972  . Smokeless tobacco: Never Used  . Alcohol Use: No  . Drug Use: No  . Sexual Activity: None   Other Topics Concern  . 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   Smoked when she was a teenager   no alcohol   no Drugs      Financial assistance approved for 100% discount at [facility] and has [facility] card per Rudell Cobb   10/30/2009      She was just approved for Disability and is looking for an income controlled housing so she can move out of the shelter.  Living in a rent controlled apartment.    Review of Systems: Review of Systems  Constitutional: Negative for fever, chills and malaise/fatigue.  HENT: Negative for sore throat.   Respiratory: Negative for cough and shortness of breath.   Cardiovascular: Negative for chest pain, palpitations and orthopnea.  Gastrointestinal: Negative for heartburn, nausea, vomiting, abdominal pain and diarrhea.  Genitourinary: Negative for dysuria, urgency and frequency.  Musculoskeletal: Negative for back pain, myalgias and neck pain.  Neurological: Negative for weakness and headaches.    Objective:  Physical Exam: Filed Vitals:   06/04/13 1359  BP:  142/85  Pulse: 80  Temp: 97.9 F (36.6 C)  TempSrc: Oral  Weight: 268 lb 14.4 oz (121.972 kg)  SpO2: 100%   Physical Exam  Constitutional: She is oriented to person, place, and time. She appears well-developed and well-nourished. No distress.  Cardiovascular: Normal rate, regular rhythm, normal heart sounds and intact distal pulses.  Exam reveals no friction rub.   No murmur heard. Pulmonary/Chest: Effort normal and breath sounds normal. No respiratory distress. She has no wheezes. She has no rales.  Musculoskeletal:  Patient had decreased pulses in bil feet 1+ DP and 1+ PT. Decreased sensation in left foot. Well healed ulcer on left heal.  Neurological: She is alert and oriented to person, place, and time.  Skin: She is not diaphoretic.  Psychiatric: She has a normal mood and affect. Her behavior is normal.    Assessment & Plan:

## 2013-06-04 NOTE — Patient Instructions (Signed)
Please bring your medicines with you each time you come.   Medicines may be  Eye drops  Herbal   Vitamins  Pills  Seeing these help [place] take care of you.  Keep up the good work on your Diabetes! You have done great.

## 2013-06-05 NOTE — Assessment & Plan Note (Signed)
Patients left heal has appeared to be healed well. She requires new diabetic shoes. I have filled out the required forms and performed the foot exam.

## 2013-06-05 NOTE — Assessment & Plan Note (Signed)
BP Readings from Last 3 Encounters:  06/04/13 116/75  04/16/13 118/72  04/04/13 107/68    Lab Results  Component Value Date   NA 136 02/14/2013   K 4.9 02/14/2013   CREATININE 1.42* 02/14/2013    Assessment: Blood pressure control: controlled Progress toward BP goal:  at goal Comments: DOing great on current regimen.  Plan: Medications:  continue current medications Other plans: F/U in 2-3 months

## 2013-06-05 NOTE — Assessment & Plan Note (Signed)
Lab Results  Component Value Date   HGBA1C 6.5 06/04/2013   HGBA1C 6.8* 01/30/2013   HGBA1C 6.6 12/05/2012     Assessment: Diabetes control: good control (HgbA1C at goal) Progress toward A1C goal:  at goal Comments: Doing well on current regimen.  Plan: Medications:  continue current medications Other plans: F/U in 2-3 months.

## 2013-06-06 NOTE — Progress Notes (Signed)
Case discussed with Dr. Komanski at the time of the visit.  We reviewed the resident's history and exam and pertinent patient test results.  I agree with the assessment, diagnosis, and plan of care documented in the resident's note.      

## 2013-06-27 ENCOUNTER — Other Ambulatory Visit: Payer: Self-pay | Admitting: Internal Medicine

## 2013-08-01 DIAGNOSIS — H4011X0 Primary open-angle glaucoma, stage unspecified: Secondary | ICD-10-CM | POA: Diagnosis not present

## 2013-08-01 DIAGNOSIS — E11319 Type 2 diabetes mellitus with unspecified diabetic retinopathy without macular edema: Secondary | ICD-10-CM | POA: Diagnosis not present

## 2013-08-02 ENCOUNTER — Other Ambulatory Visit: Payer: Self-pay | Admitting: Pulmonary Disease

## 2013-08-02 DIAGNOSIS — Z8631 Personal history of diabetic foot ulcer: Secondary | ICD-10-CM

## 2013-08-05 ENCOUNTER — Encounter (HOSPITAL_BASED_OUTPATIENT_CLINIC_OR_DEPARTMENT_OTHER): Payer: Medicare Other | Attending: General Surgery

## 2013-08-05 DIAGNOSIS — Z7982 Long term (current) use of aspirin: Secondary | ICD-10-CM | POA: Insufficient documentation

## 2013-08-05 DIAGNOSIS — Z79899 Other long term (current) drug therapy: Secondary | ICD-10-CM | POA: Diagnosis not present

## 2013-08-05 DIAGNOSIS — G4733 Obstructive sleep apnea (adult) (pediatric): Secondary | ICD-10-CM | POA: Diagnosis not present

## 2013-08-05 DIAGNOSIS — N183 Chronic kidney disease, stage 3 unspecified: Secondary | ICD-10-CM | POA: Diagnosis not present

## 2013-08-05 DIAGNOSIS — L97509 Non-pressure chronic ulcer of other part of unspecified foot with unspecified severity: Secondary | ICD-10-CM | POA: Diagnosis not present

## 2013-08-05 DIAGNOSIS — E1169 Type 2 diabetes mellitus with other specified complication: Secondary | ICD-10-CM | POA: Insufficient documentation

## 2013-08-06 NOTE — Progress Notes (Signed)
[facility]  NAMESHANNARA, WINBUSH              ACCOUNT NO.:  [id number]  MEDICAL RECORD NO.:  [id number]      DATE OF BIRTH:  [birth year]  PHYSICIAN:  Glenna Fellows, MD    VISIT DATE:  08/05/2013                                  OFFICE VISIT   HISTORY OF PRESENT ILLNESS:  Ms. Odekirk is a 58 year old diabetic female that is here for recurrent ulceration on her left plantar foot that has been present for approximately 2 weeks' time.  She has a history of Charcot foot and was treated for plantar ulcer earlier this year, had gone on to heal with contact casting as well as local wound care.  PAST MEDICAL HISTORY:  Reviewed.  Significant for obstructive sleep apnea, on CPAP, diabetes mellitus that is well controlled with hemoglobin A1c of 6.5 in May 2015, and stage III chronic kidney disease.  MEDICATIONS:  Aspirin, Lasix, ferrous sulfate, Januvia, lisinopril, metformin, and Protonix.  SOCIAL HISTORY:  The patient is a nonsmoker.  PHYSICAL EXAMINATION:  ABI over the left side is calculated as 1.0.  She has positive sensation over all tested points by Semmes-Weinstein test with the exception of her left plantar heel.  VITAL SIGNS:  Blood pressure is 126/76, pulse 76, temperature is 98.4, weight is 250 pounds.  Over the left plantar heel, she has Wagner 2 ulceration, measured at 0.5 x 0.3 x 0.5 cm.  There is undermining of the skin up to 1 cm.  The patient is largely anesthetic in the area.  Scissors were used to excise all of the epibole as well as to remove the slough present over the base of the wound.  Post debridement measurements were 0.8 x 0.7 x 0.2 cm.  We will plan to institute total contact casting and placement of collagen to the wound.  The patient will follow up in 1 week's time.          ______________________________ Glenna Fellows, MD     BT/MEDQ  D:  08/05/2013  T:  08/06/2013  Job:  [id number]

## 2013-08-07 DIAGNOSIS — Z79899 Other long term (current) drug therapy: Secondary | ICD-10-CM | POA: Diagnosis not present

## 2013-08-07 DIAGNOSIS — N183 Chronic kidney disease, stage 3 unspecified: Secondary | ICD-10-CM | POA: Diagnosis not present

## 2013-08-07 DIAGNOSIS — G4733 Obstructive sleep apnea (adult) (pediatric): Secondary | ICD-10-CM | POA: Diagnosis not present

## 2013-08-07 DIAGNOSIS — Z7982 Long term (current) use of aspirin: Secondary | ICD-10-CM | POA: Diagnosis not present

## 2013-08-07 DIAGNOSIS — L97509 Non-pressure chronic ulcer of other part of unspecified foot with unspecified severity: Secondary | ICD-10-CM | POA: Diagnosis not present

## 2013-08-07 DIAGNOSIS — E1169 Type 2 diabetes mellitus with other specified complication: Secondary | ICD-10-CM | POA: Diagnosis not present

## 2013-08-12 DIAGNOSIS — G4733 Obstructive sleep apnea (adult) (pediatric): Secondary | ICD-10-CM | POA: Diagnosis not present

## 2013-08-12 DIAGNOSIS — L97509 Non-pressure chronic ulcer of other part of unspecified foot with unspecified severity: Secondary | ICD-10-CM | POA: Diagnosis not present

## 2013-08-12 DIAGNOSIS — E1169 Type 2 diabetes mellitus with other specified complication: Secondary | ICD-10-CM | POA: Diagnosis not present

## 2013-08-12 DIAGNOSIS — Z79899 Other long term (current) drug therapy: Secondary | ICD-10-CM | POA: Diagnosis not present

## 2013-08-12 DIAGNOSIS — N183 Chronic kidney disease, stage 3 unspecified: Secondary | ICD-10-CM | POA: Diagnosis not present

## 2013-08-12 DIAGNOSIS — Z7982 Long term (current) use of aspirin: Secondary | ICD-10-CM | POA: Diagnosis not present

## 2013-08-19 ENCOUNTER — Encounter (HOSPITAL_BASED_OUTPATIENT_CLINIC_OR_DEPARTMENT_OTHER): Payer: Medicare Other | Attending: Plastic Surgery

## 2013-08-19 DIAGNOSIS — L97409 Non-pressure chronic ulcer of unspecified heel and midfoot with unspecified severity: Secondary | ICD-10-CM | POA: Diagnosis not present

## 2013-08-19 DIAGNOSIS — E1169 Type 2 diabetes mellitus with other specified complication: Secondary | ICD-10-CM | POA: Diagnosis not present

## 2013-08-26 DIAGNOSIS — L97409 Non-pressure chronic ulcer of unspecified heel and midfoot with unspecified severity: Secondary | ICD-10-CM | POA: Diagnosis not present

## 2013-08-26 DIAGNOSIS — E1169 Type 2 diabetes mellitus with other specified complication: Secondary | ICD-10-CM | POA: Diagnosis not present

## 2013-08-28 ENCOUNTER — Ambulatory Visit: Payer: Medicare Other | Admitting: Podiatry

## 2013-09-02 DIAGNOSIS — E1169 Type 2 diabetes mellitus with other specified complication: Secondary | ICD-10-CM | POA: Diagnosis not present

## 2013-09-02 DIAGNOSIS — L97409 Non-pressure chronic ulcer of unspecified heel and midfoot with unspecified severity: Secondary | ICD-10-CM | POA: Diagnosis not present

## 2013-09-09 DIAGNOSIS — L97409 Non-pressure chronic ulcer of unspecified heel and midfoot with unspecified severity: Secondary | ICD-10-CM | POA: Diagnosis not present

## 2013-09-09 DIAGNOSIS — E1169 Type 2 diabetes mellitus with other specified complication: Secondary | ICD-10-CM | POA: Diagnosis not present

## 2013-09-11 ENCOUNTER — Encounter: Payer: Self-pay | Admitting: Internal Medicine

## 2013-09-11 ENCOUNTER — Ambulatory Visit (INDEPENDENT_AMBULATORY_CARE_PROVIDER_SITE_OTHER): Payer: Medicare Other | Admitting: Internal Medicine

## 2013-09-11 VITALS — Ht 70.0 in | Wt 272.2 lb

## 2013-09-11 DIAGNOSIS — Z23 Encounter for immunization: Secondary | ICD-10-CM | POA: Diagnosis not present

## 2013-09-11 DIAGNOSIS — Z862 Personal history of diseases of the blood and blood-forming organs and certain disorders involving the immune mechanism: Secondary | ICD-10-CM | POA: Diagnosis not present

## 2013-09-11 DIAGNOSIS — I10 Essential (primary) hypertension: Secondary | ICD-10-CM | POA: Diagnosis not present

## 2013-09-11 DIAGNOSIS — Z0000 Encounter for general adult medical examination without abnormal findings: Secondary | ICD-10-CM | POA: Diagnosis not present

## 2013-09-11 DIAGNOSIS — E119 Type 2 diabetes mellitus without complications: Secondary | ICD-10-CM

## 2013-09-11 DIAGNOSIS — N183 Chronic kidney disease, stage 3 unspecified: Secondary | ICD-10-CM

## 2013-09-11 DIAGNOSIS — Z8631 Personal history of diabetic foot ulcer: Secondary | ICD-10-CM

## 2013-09-11 DIAGNOSIS — Z8639 Personal history of other endocrine, nutritional and metabolic disease: Secondary | ICD-10-CM

## 2013-09-11 LAB — POCT GLYCOSYLATED HEMOGLOBIN (HGB A1C): Hemoglobin A1C: 6.7

## 2013-09-11 LAB — GLUCOSE, CAPILLARY: Glucose-Capillary: 141 mg/dL — ABNORMAL HIGH (ref 70–99)

## 2013-09-11 NOTE — Assessment & Plan Note (Signed)
Pt follows with wound care weekly for left plantar heel -continue following with wound care; next appt 8/31

## 2013-09-11 NOTE — Assessment & Plan Note (Signed)
-   check BMP

## 2013-09-11 NOTE — Assessment & Plan Note (Addendum)
-  flu vaccine given today -needs pap smear, should schedule with PCP  -need to obtain records from Dr. Telford Nab office (optometry) for eye exam pt had in July 2015; additionally she has open angle glaucoma bilaterally which should be added to her problem list

## 2013-09-11 NOTE — Patient Instructions (Signed)
Thank you for your visit today.   Please return to the internal medicine clinic in 3 month(s) or sooner if needed.     Your current medical regimen is effective;  continue present plan and take all medications as prescribed.    I have made the following additions/changes to your medications: continue current meds.  You need the following test(s) for regular health maintenance: pap smear.   Please be sure to bring all of your medications with you to every visit; this includes herbal supplements, vitamins, eye drops, and any over-the-counter medications.   Should you have any questions regarding your medications and/or any new or worsening symptoms, please be sure to call the clinic at [phone].   If you believe that you are suffering from a life threatening condition or one that may result in the loss of limb or function, then you should call 911 or proceed to the nearest Emergency Department.     A healthy lifestyle and preventative care can promote health and wellness.   Maintain regular health, dental, and eye exams.  Eat a healthy diet. Foods like vegetables, fruits, whole grains, low-fat dairy products, and lean protein foods contain the nutrients you need without too many calories. Decrease your intake of foods high in solid fats, added sugars, and salt. Get information about a proper diet from your caregiver, if necessary.  Regular physical exercise is one of the most important things you can do for your health. Most adults should get at least 150 minutes of moderate-intensity exercise (any activity that increases your heart rate and causes you to sweat) each week. In addition, most adults need muscle-strengthening exercises on 2 or more days a week.   Maintain a healthy weight. The body mass index (BMI) is a screening tool to identify possible weight problems. It provides an estimate of body fat based on height and weight. Your caregiver can help determine your BMI, and can  help you achieve or maintain a healthy weight. For adults 20 years and older:  A BMI below 18.5 is considered underweight.  A BMI of 18.5 to 24.9 is normal.  A BMI of 25 to 29.9 is considered overweight.  A BMI of 30 and above is considered obese.

## 2013-09-11 NOTE — Assessment & Plan Note (Signed)
HA1c today: 6.7, well controlled on metformin 1000mg  twice daily and januvia 100mg  daily.  Although she reports some abdominal cramping after taking meds. -suggested to take meds with meals to lessen GI side effects and also try splitting the metformin in half and take 4 tabs throughout the day at mealtimes (spoke with [facility] regarding this) -at next visit with PCP, may discuss lowering the dose but I was hesitant at this visit because she is well-controlled and wanted her to try the above measures first

## 2013-09-11 NOTE — Assessment & Plan Note (Signed)
BP 139/79 today.  Currently on lasix 40mg  daily, lisinopril 20mg  daily.  -continue current meds -check BMP

## 2013-09-11 NOTE — Progress Notes (Signed)
Patient ID: [name], female   DOB: 07-13-1955, 58 y.o.   MRN: [id number]    Subjective:   Patient ID: [name] female    DOB: 1955/12/28 59 y.o.    MRN: [id number] Health Maintenance Due: Health Maintenance Due  Topic Date Due  . Ophthalmology Exam  05/21/2013  . Pap Smear  05/25/2013  . Lipid Panel  08/28/2013  . Influenza Vaccine  08/17/2013  . Urine Microalbumin  08/30/2013  . Hemoglobin A1c  09/04/2013    _________________________________________________  HPI: [name] is a 58 y.o. female here for a routine visit.  Pt has a PMH outlined below.  Please see problem-based charting assessment and plan note for further details of medical issues addressed at today's visit.  PMH: Past Medical History  Diagnosis Date  . Normocytic anemia   . Hypertension   . Foot ulcer, left   . Leg edema   . Glaucoma   . GERD (gastroesophageal reflux disease)   . High cholesterol   . Type II diabetes mellitus   . OSA on CPAP   . H/O hiatal hernia   . Arthritis     "right leg" (01/30/2013)  . CKD (chronic kidney disease), stage III     /notes 01/30/2013    Medications: Current Outpatient Prescriptions on File Prior to Visit  Medication Sig Dispense Refill  . acetaminophen (TYLENOL) 325 MG tablet Take 1 tablet (325 mg total) by mouth every 6 (six) hours as needed. For back pain.  30 tablet    . aspirin EC 81 MG tablet Take 81 mg by mouth daily.      . ferrous sulfate 325 (65 FE) MG tablet Take 1 tablet (325 mg total) by mouth daily. With orange juice  30 tablet  0  . furosemide (LASIX) 40 MG tablet Take 1 tablet (40 mg total) by mouth daily.  30 tablet  6  . glucose blood (ACCU-CHEK AVIVA PLUS) test strip Use as directed for once daily glucose testing. Dx: ICD-9 250.00.  100 each  1  . JANUVIA 100 MG tablet TAKE 1 TABLET BY MOUTH DAILY  90 tablet  3  . lisinopril (PRINIVIL,ZESTRIL) 20 MG tablet Take 1 tablet (20 mg total) by mouth daily.  30 tablet  3  .  LUMIGAN 0.01 % SOLN Place 1 drop into both eyes every evening.      . metFORMIN (GLUCOPHAGE) 1000 MG tablet Take 1 tablet (1,000 mg total) by mouth 2 (two) times daily with a meal.  60 tablet  6  . Multiple Vitamin (MULTIVITAMIN) tablet Take 1 tablet by mouth daily.       . pantoprazole (PROTONIX) 40 MG tablet TAKE 1 TABLET BY MOUTH DAILY  90 tablet  4  . Calcium Carbonate-Vitamin D 600-400 MG-UNIT per chew tablet Chew 1 tablet by mouth 2 (two) times daily.  30 tablet  11  . [DISCONTINUED] pravastatin (PRAVACHOL) 20 MG tablet Take 1 tablet (20 mg total) by mouth daily.  30 tablet  6   No current facility-administered medications on file prior to visit.    Allergies: Allergies  Allergen Reactions  . Codeine     REACTION: swelling in throat and itching  . Pravastatin Other (See Comments)    cramps    FH: Family History  Problem Relation Age of Onset  . Diabetes Mother   . Hypertension Mother   . Heart disease Father   . Stroke Father   . Diabetes Father   .  Hypertension Father   . Gout Father   . Deep vein thrombosis Father     SH: History   Social History  . Marital Status: Single    Spouse Name: N/A    Number of Children: N/A  . Years of Education: N/A   Social History Main Topics  . Smoking status: Former Smoker -- 0.50 packs/day for 4 years    Types: Cigarettes    Quit date: 12/22/1972  . Smokeless tobacco: Never Used  . Alcohol Use: No  . Drug Use: No  . Sexual Activity: Not on file   Other Topics Concern  . 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   Smoked when she was a teenager   no alcohol   no Drugs      Financial assistance approved for 100% discount at [facility] and has [facility] card per Rudell Cobb   10/30/2009      She was just approved for Disability and is looking for an income controlled housing so she can move out of the shelter.  Living in a rent controlled apartment.      Review of Systems: Constitutional: Negative for fever, chills and weight loss.  Eyes: Negative for blurred vision.  Respiratory: Negative for cough and shortness of breath.  Cardiovascular: Negative for chest pain, palpitations and leg swelling.  Gastrointestinal: Negative for nausea, vomiting, +abdominal cramps, diarrhea, constipation and blood in stool.  Genitourinary: Negative for dysuria, urgency and frequency.  Musculoskeletal: Negative for myalgias and back pain.  Neurological: Negative for dizziness, weakness and headaches.     Objective:   Vital Signs: Filed Vitals:   09/11/13 1327  Height: 5\' 10"  (1.778 m)  Weight: 272 lb 3.2 oz (123.469 kg)     BP Readings from Last 3 Encounters:  06/04/13 116/75  04/16/13 118/72  04/04/13 107/68    Physical Exam: Constitutional: Vital signs reviewed.  Patient is well-developed and well-nourished in NAD and cooperative with exam. She is using a walker.  Head: Normocephalic and atraumatic. Eyes: PERRL, EOMI, conjunctivae nl, no scleral icterus.  Neck: Supple. Cardiovascular: RRR, no MRG. Pulmonary/Chest: normal effort, non-tender to palpation, CTAB, no wheezes, rales, or rhonchi. Abdominal: Obese. Soft. NT/ND +BS. Neurological: A&O x3, cranial nerves II-XII are grossly intact, moving all extremities. Extremities: 2+DP b/l; no pitting edema.  Has boot on the LLE d/t a left heel wound.  Skin: Warm, dry and intact. No rash.  Most Recent Laboratory Results:  CMP     Component Value Date/Time   NA 136 02/14/2013 1417   K 4.9 02/14/2013 1417   CL 101 02/14/2013 1417   CO2 29 02/14/2013 1417   GLUCOSE 86 02/14/2013 1417   BUN 33* 02/14/2013 1417   CREATININE 1.42* 02/14/2013 1417   CREATININE 1.49* 02/02/2013 0520   CALCIUM 9.4 02/14/2013 1417   PROT 8.0 01/30/2013 1726   ALBUMIN 3.9 01/30/2013 1726   AST 16 01/30/2013 1726   ALT 7 01/30/2013 1726   ALKPHOS 57 01/30/2013 1726   BILITOT <0.2* 01/30/2013 1726   GFRNONAA 38* 02/02/2013  0520   GFRNONAA 46* 09/06/2012 1407   GFRAA 44* 02/02/2013 0520   GFRAA 53* 09/06/2012 1407    CBC    Component Value Date/Time   WBC 8.1 01/30/2013 1726   RBC 3.68* 01/30/2013 1726   HGB 10.2* 01/30/2013 1726   HCT 30.8* 01/30/2013 1726   PLT 265 01/30/2013 1726  MCV 83.7 01/30/2013 1726   MCH 27.7 01/30/2013 1726   MCHC 33.1 01/30/2013 1726   RDW 15.9* 01/30/2013 1726   LYMPHSABS 2.5 01/30/2013 1726   MONOABS 0.6 01/30/2013 1726   EOSABS 0.2 01/30/2013 1726   BASOSABS 0.0 01/30/2013 1726    Assessment & Plan:   Assessment and plan was discussed and formulated with my attending.

## 2013-09-12 LAB — BASIC METABOLIC PANEL WITH GFR
BUN: 23 mg/dL (ref 6–23)
CO2: 28 mEq/L (ref 19–32)
CREATININE: 1.35 mg/dL — AB (ref 0.50–1.10)
Calcium: 9.4 mg/dL (ref 8.4–10.5)
Chloride: 103 mEq/L (ref 96–112)
GFR, EST AFRICAN AMERICAN: 50 mL/min — AB
GFR, EST NON AFRICAN AMERICAN: 44 mL/min — AB
GLUCOSE: 102 mg/dL — AB (ref 70–99)
Potassium: 4.4 mEq/L (ref 3.5–5.3)
Sodium: 137 mEq/L (ref 135–145)

## 2013-09-12 LAB — LIPID PANEL
CHOL/HDL RATIO: 3.6 ratio
CHOLESTEROL: 146 mg/dL (ref 0–200)
HDL: 41 mg/dL (ref >39)
LDL CALC: 85 mg/dL (ref 0–99)
TRIGLYCERIDES: 100 mg/dL (ref <150)
VLDL: 20 mg/dL (ref 0–40)

## 2013-09-12 LAB — MICROALBUMIN / CREATININE URINE RATIO
Creatinine, Urine: 75.5 mg/dL
Microalb Creat Ratio: 6.6 mg/g (ref 0.0–30.0)
Microalb, Ur: 0.5 mg/dL (ref 0.00–1.89)

## 2013-09-13 ENCOUNTER — Other Ambulatory Visit: Payer: Self-pay | Admitting: *Deleted

## 2013-09-13 MED ORDER — LISINOPRIL 20 MG PO TABS: 20.0000 mg | ORAL_TABLET | Freq: Every day | ORAL | Status: DC

## 2013-09-16 DIAGNOSIS — L97409 Non-pressure chronic ulcer of unspecified heel and midfoot with unspecified severity: Secondary | ICD-10-CM | POA: Diagnosis not present

## 2013-09-16 DIAGNOSIS — E1169 Type 2 diabetes mellitus with other specified complication: Secondary | ICD-10-CM | POA: Diagnosis not present

## 2013-09-17 NOTE — Progress Notes (Signed)
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  HISTORY OF PRESENT ILLNESS:  The patient is a 58 year old female with recurrent left foot plantar ulceration.  She has currently been undergoing wound care with endoform and EasyCast.  Today, she presents for application of TheraSkin.  PHYSICAL EXAMINATION:  VITAL SIGNS:  Blood pressure is 121/57, blood glucose is 125, pulse is 80, temperature is 98.3. EXTREMITIES:  Plantar foot has a wound measured at 0.2 x 0.2 x 0.3 cm, classified as a Wagner II ulceration with undermining up to 0.4 cm.  After application of topical anesthetic, scissors were used to excise the undermined skin and surrounding callus.  Wound was irrigated, 6.5 cm2 of TheraSkin was applied to the wound base.  This was dressed with Adaptic and secured to the adjacent skin using Steri-Strips.  EZ cast was then applied.  The patient will return in 1 week's time for a nurse visit and follow up with myself in 2 weeks' time.          ______________________________ Glenna Fellows, MD     BT/MEDQ  D:  09/16/2013  T:  09/17/2013  Job:  [id number]

## 2013-09-17 NOTE — Progress Notes (Signed)
Internal Medicine Clinic Attending Date of visit: 09/11/2013   Case discussed with Dr. Gill soon after the resident saw the patient.  We reviewed the resident's history and exam and pertinent patient test results.  I agree with the assessment, diagnosis, and plan of care documented in the resident's note. 

## 2013-09-24 ENCOUNTER — Encounter (HOSPITAL_BASED_OUTPATIENT_CLINIC_OR_DEPARTMENT_OTHER): Payer: Medicare Other | Attending: Plastic Surgery

## 2013-09-24 DIAGNOSIS — L97409 Non-pressure chronic ulcer of unspecified heel and midfoot with unspecified severity: Secondary | ICD-10-CM | POA: Insufficient documentation

## 2013-09-30 DIAGNOSIS — L97409 Non-pressure chronic ulcer of unspecified heel and midfoot with unspecified severity: Secondary | ICD-10-CM | POA: Diagnosis not present

## 2013-10-02 ENCOUNTER — Other Ambulatory Visit: Payer: Self-pay | Admitting: Internal Medicine

## 2013-10-08 ENCOUNTER — Other Ambulatory Visit: Payer: Self-pay | Admitting: *Deleted

## 2013-10-08 MED ORDER — GLUCOSE BLOOD VI STRP: ORAL_STRIP | Status: DC

## 2013-10-08 NOTE — Telephone Encounter (Signed)
RX needed Dx Code and instructions.  Thanks

## 2013-10-10 ENCOUNTER — Ambulatory Visit (INDEPENDENT_AMBULATORY_CARE_PROVIDER_SITE_OTHER): Payer: Medicare Other | Admitting: Podiatry

## 2013-10-10 ENCOUNTER — Encounter: Payer: Self-pay | Admitting: Podiatry

## 2013-10-10 VITALS — BP 135/78 | HR 74 | Resp 17

## 2013-10-10 DIAGNOSIS — Q828 Other specified congenital malformations of skin: Secondary | ICD-10-CM

## 2013-10-10 DIAGNOSIS — E1149 Type 2 diabetes mellitus with other diabetic neurological complication: Secondary | ICD-10-CM | POA: Diagnosis not present

## 2013-10-10 DIAGNOSIS — E1159 Type 2 diabetes mellitus with other circulatory complications: Secondary | ICD-10-CM

## 2013-10-10 NOTE — Progress Notes (Signed)
   Subjective:    Patient ID: [name], female    DOB: 10/23/1955, 58 y.o.   MRN: [id number]  HPI  Pt presents for ongoing treatment of left ulcer, and c/o occasional leg cramps in right leg  That wakes her up in the morning. She states that they only happen about twice a month and she stretches and tries to walk when they occur, no other issues. Wound is healing well, no drainage  Review of Systems     Objective:   Physical Exam        Assessment & Plan:

## 2013-10-11 NOTE — Progress Notes (Signed)
Subjective:     Patient ID: [name], female   DOB: 10-23-1955, 58 y.o.   MRN: [id number]  HPI patient has had ulcer plantar aspect left foot that has been treated with cast immobilization and is finally healed over with crusted tissue and  patient here for evaluation   Review of Systems     Objective:   Physical Exam Neurovascular status unchanged with health history unchanged and is noted to have keratotic lesion plantar aspect left foot that does not show signs of drainage and does not show signs of breakdown    Assessment:     Keratotic tissue left plantar foot in at risk patient with history of ulceration    Plan:     Educated patient and debrided lesion with no drainage noted and advised on return visit to continue this conservative care

## 2013-10-28 ENCOUNTER — Other Ambulatory Visit: Payer: Self-pay | Admitting: *Deleted

## 2013-10-29 ENCOUNTER — Other Ambulatory Visit: Payer: Self-pay | Admitting: *Deleted

## 2013-10-29 MED ORDER — ACCU-CHEK AVIVA PLUS W/DEVICE KIT: PACK | Status: DC

## 2013-10-29 MED ORDER — ACCU-CHEK AVIVA PLUS W/DEVICE KIT: 1.0000 | PACK | Status: DC

## 2013-10-29 NOTE — Telephone Encounter (Signed)
Just done today.

## 2013-10-29 NOTE — Telephone Encounter (Signed)
Need new rx w/instructions and Dx code. Please check what I have included. Thanks

## 2013-10-29 NOTE — Telephone Encounter (Signed)
Dx code and directions were not included.

## 2013-10-29 NOTE — Telephone Encounter (Signed)
Can you send it back as an Rx to sign?  I can't seem to find it in the encounter, happy to sign addendum with diagnosis code or possibly could we call the pharmacy to add the dx code?    Thank you  EBM

## 2013-10-29 NOTE — Telephone Encounter (Signed)
Need new rx with direction and new ICD-10 code. Please check. Thanks

## 2013-11-05 DIAGNOSIS — E11319 Type 2 diabetes mellitus with unspecified diabetic retinopathy without macular edema: Secondary | ICD-10-CM | POA: Diagnosis not present

## 2013-11-05 DIAGNOSIS — H4011X2 Primary open-angle glaucoma, moderate stage: Secondary | ICD-10-CM | POA: Diagnosis not present

## 2013-11-05 LAB — HM DIABETES EYE EXAM

## 2013-11-25 ENCOUNTER — Ambulatory Visit: Payer: Medicare Other | Admitting: Podiatry

## 2013-11-26 DIAGNOSIS — H4011X2 Primary open-angle glaucoma, moderate stage: Secondary | ICD-10-CM | POA: Diagnosis not present

## 2013-12-02 ENCOUNTER — Ambulatory Visit: Payer: Medicare Other | Admitting: Podiatry

## 2013-12-02 ENCOUNTER — Other Ambulatory Visit: Payer: Self-pay | Admitting: Internal Medicine

## 2013-12-03 ENCOUNTER — Ambulatory Visit: Payer: Medicare Other | Admitting: Pulmonary Disease

## 2013-12-03 ENCOUNTER — Encounter: Payer: Self-pay | Admitting: *Deleted

## 2013-12-05 ENCOUNTER — Other Ambulatory Visit: Payer: Self-pay | Admitting: *Deleted

## 2013-12-05 MED ORDER — GLUCOSE BLOOD VI STRP: ORAL_STRIP | Status: DC

## 2013-12-20 ENCOUNTER — Other Ambulatory Visit: Payer: Self-pay | Admitting: Pulmonary Disease

## 2013-12-20 ENCOUNTER — Other Ambulatory Visit: Payer: Self-pay | Admitting: *Deleted

## 2013-12-21 MED ORDER — GLUCOSE BLOOD VI STRP: ORAL_STRIP | Status: DC

## 2013-12-21 MED ORDER — METFORMIN HCL 1000 MG PO TABS: 1000.0000 mg | ORAL_TABLET | Freq: Two times a day (BID) | ORAL | Status: DC

## 2013-12-26 ENCOUNTER — Encounter: Payer: Self-pay | Admitting: Pulmonary Disease

## 2013-12-26 ENCOUNTER — Ambulatory Visit (INDEPENDENT_AMBULATORY_CARE_PROVIDER_SITE_OTHER): Payer: Medicare Other | Admitting: Pulmonary Disease

## 2013-12-26 VITALS — BP 123/69 | HR 85 | Temp 98.2°F | Ht 70.0 in | Wt 270.8 lb

## 2013-12-26 DIAGNOSIS — D649 Anemia, unspecified: Secondary | ICD-10-CM

## 2013-12-26 DIAGNOSIS — Z0000 Encounter for general adult medical examination without abnormal findings: Secondary | ICD-10-CM | POA: Diagnosis not present

## 2013-12-26 DIAGNOSIS — K219 Gastro-esophageal reflux disease without esophagitis: Secondary | ICD-10-CM | POA: Diagnosis not present

## 2013-12-26 DIAGNOSIS — Z8631 Personal history of diabetic foot ulcer: Secondary | ICD-10-CM

## 2013-12-26 DIAGNOSIS — I10 Essential (primary) hypertension: Secondary | ICD-10-CM | POA: Diagnosis not present

## 2013-12-26 DIAGNOSIS — E119 Type 2 diabetes mellitus without complications: Secondary | ICD-10-CM | POA: Diagnosis not present

## 2013-12-26 DIAGNOSIS — Z1231 Encounter for screening mammogram for malignant neoplasm of breast: Secondary | ICD-10-CM

## 2013-12-26 LAB — POCT GLYCOSYLATED HEMOGLOBIN (HGB A1C): Hemoglobin A1C: 6.5

## 2013-12-26 LAB — GLUCOSE, CAPILLARY: Glucose-Capillary: 94 mg/dL (ref 70–99)

## 2013-12-26 MED ORDER — LISINOPRIL 20 MG PO TABS: 20.0000 mg | ORAL_TABLET | Freq: Every day | ORAL | Status: DC

## 2013-12-26 MED ORDER — FERROUS SULFATE 325 (65 FE) MG PO TABS: 325.0000 mg | ORAL_TABLET | Freq: Every day | ORAL | Status: DC

## 2013-12-26 MED ORDER — PANTOPRAZOLE SODIUM 40 MG PO TBEC: DELAYED_RELEASE_TABLET | ORAL | Status: DC

## 2013-12-26 MED ORDER — FUROSEMIDE 40 MG PO TABS: 40.0000 mg | ORAL_TABLET | Freq: Every day | ORAL | Status: DC

## 2013-12-26 MED ORDER — SITAGLIPTIN PHOSPHATE 100 MG PO TABS: 100.0000 mg | ORAL_TABLET | Freq: Every day | ORAL | Status: DC

## 2013-12-26 MED ORDER — METFORMIN HCL 1000 MG PO TABS: 500.0000 mg | ORAL_TABLET | Freq: Two times a day (BID) | ORAL | Status: DC

## 2013-12-26 MED ORDER — GLUCOSE BLOOD VI STRP: ORAL_STRIP | Status: DC

## 2013-12-26 NOTE — Assessment & Plan Note (Signed)
Lab Results  Component Value Date   HGBA1C 6.5 12/26/2013   HGBA1C 6.7 09/11/2013   HGBA1C 6.5 06/04/2013     Assessment: Diabetes control: good control (HgbA1C at goal) Progress toward A1C goal:  at goal Comments: Denies episodes of hypoglycemia. Also denies abdominal pain as noted in previous clinic visit.  Plan: Medications:  continue current medications - metfomin 500mg  BID and sitagliptin 100mg  daily. Instruction/counseling given: reminded to bring medications to each visit Other plans: Referred to for health coach counseling as patient has many questions about meals and meal replacement shakes.  Follow up in 6 months.

## 2013-12-26 NOTE — Assessment & Plan Note (Signed)
BP Readings from Last 3 Encounters:  12/26/13 123/69  10/10/13 135/78  06/04/13 116/75    Lab Results  Component Value Date   NA 137 09/11/2013   K 4.4 09/11/2013   CREATININE 1.35* 09/11/2013    Assessment: Blood pressure control: controlled Progress toward BP goal:  at goal  Plan: Medications:  continue current medications - lasix 40mg  daily and lisinopril 20mg  daily

## 2013-12-26 NOTE — Progress Notes (Signed)
Subjective:   Patient ID: [name], female    DOB: 06-29-1955, 58 y.o.   MRN: [id number]  HPI [name] is a 58 year old woman with history of HTN, HLD, DM2, CKD stage 3 here for routine follow up. No new complaints. Please refer to problem based charting.  Review of Systems  Constitutional: Negative for fever and chills.  HENT: Negative for sore throat.   Eyes: Negative for visual disturbance.  Respiratory: Negative for cough and shortness of breath.   Cardiovascular: Negative for chest pain.  Gastrointestinal: Negative for nausea, abdominal pain and diarrhea.  Genitourinary: Negative for dysuria.  Musculoskeletal: Negative for myalgias.  Skin: Negative for rash.  Neurological: Negative for dizziness, weakness, light-headedness and numbness.   Past Medical History  Diagnosis Date  . Normocytic anemia   . Hypertension   . Foot ulcer, left   . Leg edema   . Glaucoma   . GERD (gastroesophageal reflux disease)   . High cholesterol   . Type II diabetes mellitus   . OSA on CPAP   . H/O hiatal hernia   . Arthritis     "right leg" (01/30/2013)  . CKD (chronic kidney disease), stage III     /notes 01/30/2013   Outpatient Prescriptions Prior to Visit  Medication Sig Dispense Refill  . acetaminophen (TYLENOL) 325 MG tablet Take 1 tablet (325 mg total) by mouth every 6 (six) hours as needed. For back pain. 30 tablet   . aspirin EC 81 MG tablet Take 81 mg by mouth daily.    . Blood Glucose Monitoring Suppl (ACCU-CHEK AVIVA PLUS) W/DEVICE KIT Use to Check Blood Sugars Daily. Dx Code: E11.9. 1 kit 0  . LUMIGAN 0.01 % SOLN Place 1 drop into both eyes every evening.    . Multiple Vitamin (MULTIVITAMIN) tablet Take 1 tablet by mouth daily.     . ferrous sulfate 325 (65 FE) MG tablet Take 1 tablet (325 mg total) by mouth daily. With orange juice 30 tablet 0  . furosemide (LASIX) 40 MG tablet Take 1 tablet (40 mg total) by mouth daily. 30 tablet 6  . glucose blood  (ACCU-CHEK AVIVA PLUS) test strip Use to check blood sugar 1 time daily. diag code E11.9. Non insulin dependent 100 each 3  . JANUVIA 100 MG tablet TAKE 1 TABLET BY MOUTH DAILY 90 tablet 3  . lisinopril (PRINIVIL,ZESTRIL) 20 MG tablet Take 1 tablet (20 mg total) by mouth daily. 30 tablet 3  . metFORMIN (GLUCOPHAGE) 1000 MG tablet Take 1 tablet (1,000 mg total) by mouth 2 (two) times daily with a meal. (Patient taking differently: Take 500 mg by mouth 2 (two) times daily with a meal. ) 180 tablet 3  . pantoprazole (PROTONIX) 40 MG tablet TAKE 1 TABLET BY MOUTH DAILY 90 tablet 4  . Calcium Carbonate-Vitamin D 600-400 MG-UNIT per chew tablet Chew 1 tablet by mouth 2 (two) times daily. 30 tablet 11   No facility-administered medications prior to visit.   Today's Vitals   12/26/13 1505  BP: 123/69  Pulse: 85  Temp: 98.2 F (36.8 C)  TempSrc: Oral  Height: 5' 10"  (1.778 m)  Weight: 270 lb 12.8 oz (122.834 kg)  SpO2: 100%  PainSc: 0-No pain    Objective:  Physical Exam  Constitutional: She is oriented to person, place, and time. She appears well-developed and well-nourished. No distress.  HENT:  Head: Normocephalic and atraumatic.  Mouth/Throat: Oropharynx is clear and moist. No oropharyngeal exudate.  Eyes:  EOM are normal. Pupils are equal, round, and reactive to light.  Neck: Neck supple. No thyromegaly present.  Cardiovascular: Normal rate and regular rhythm.   No murmur heard. Pulmonary/Chest: Breath sounds normal. She has no wheezes. She has no rales.  Abdominal: Soft. She exhibits no distension. There is no tenderness.  Musculoskeletal: Normal range of motion. She exhibits no edema.  Neurological: She is alert and oriented to person, place, and time. Coordination normal.  Skin: Skin is warm and dry.  Psychiatric: She has a normal mood and affect.   Assessment & Plan:  Please refer to problem based charting.

## 2013-12-26 NOTE — Assessment & Plan Note (Signed)
Assessment: Previously followed by wound care clinic. Now followed by podiatry. Ulcer plantar aspect of left foot that has been treated with cast immobilization healing well.  Plan: - She has follow up appointment scheduled with podiatry 12/30/2013

## 2013-12-26 NOTE — Patient Instructions (Signed)
General Instructions:   Please bring your medicines with you each time you come to clinic.  Medicines may include prescription medications, over-the-counter medications, herbal remedies, eye drops, vitamins, or other pills.   Progress Toward Treatment Goals:  Treatment Goal 12/26/2013  Hemoglobin A1C at goal  Blood pressure at goal    Self Care Goals & Plans:  Self Care Goal 12/26/2013  Manage my medications take my medicines as prescribed; bring my medications to every visit  Monitor my health -  Eat healthy foods drink diet soda or water instead of juice or soda; eat more vegetables; eat foods that are low in salt; eat baked foods instead of fried foods; eat fruit for snacks and desserts  Be physically active find an activity I enjoy  Meeting treatment goals maintain the current self-care plan    Home Blood Glucose Monitoring 04/16/2013  Check my blood sugar once a day  When to check my blood sugar before breakfast; before dinner     Care Management & Community Referrals:  Referral 12/26/2013  Referrals made for care management support health educator

## 2013-12-26 NOTE — Assessment & Plan Note (Signed)
-  Patient would like to do Pap smear at next follow up appointment. -Screening mammogram ordered. -Fecal occult blood cards provided.

## 2013-12-27 NOTE — Progress Notes (Signed)
Medicine attending: I personally interviewed and briefly examined this patient and reviewed pertinent laboratory and radiographic data together with resident physician Dr. Jennifer Krall and  I concur with her evaluation and management plan. 

## 2013-12-30 ENCOUNTER — Encounter: Payer: Self-pay | Admitting: Podiatry

## 2013-12-30 ENCOUNTER — Ambulatory Visit (INDEPENDENT_AMBULATORY_CARE_PROVIDER_SITE_OTHER): Payer: Medicare Other | Admitting: Podiatry

## 2013-12-30 VITALS — BP 134/73 | HR 83 | Resp 18

## 2013-12-30 DIAGNOSIS — Q828 Other specified congenital malformations of skin: Secondary | ICD-10-CM

## 2013-12-30 DIAGNOSIS — E1151 Type 2 diabetes mellitus with diabetic peripheral angiopathy without gangrene: Secondary | ICD-10-CM

## 2013-12-30 DIAGNOSIS — M79673 Pain in unspecified foot: Secondary | ICD-10-CM

## 2013-12-31 NOTE — Progress Notes (Signed)
Subjective:     Patient ID: [name], female   DOB: [birth year], 58 y.o.   MRN: [id number]  HPI patient presents with plantar lesion left that's been bothering her and long-term diabetes that's not been in great control   Review of Systems     Objective:   Physical Exam Neurovascular status unchanged with thick plantar lesion left fifth metatarsal that she cannot cut and at risk condition and pain when pressed    Assessment:     At risk diabetic with plantar lesion which is pre-ulcerative in nature    Plan:     Debrided the lesion fully and discussed diabetic shoes to try to prevent for ulceration. Patient wants to have these made and will have them done when we get approval from family physician

## 2014-01-27 ENCOUNTER — Ambulatory Visit (HOSPITAL_COMMUNITY)
Admission: RE | Admit: 2014-01-27 | Discharge: 2014-01-27 | Disposition: A | Discharge status: Home / Self Care | Payer: Medicare Other | Source: Ambulatory Visit | Attending: Internal Medicine | Admitting: Internal Medicine

## 2014-01-27 DIAGNOSIS — Z1231 Encounter for screening mammogram for malignant neoplasm of breast: Secondary | ICD-10-CM | POA: Diagnosis present

## 2014-02-04 ENCOUNTER — Encounter: Payer: Medicare Other | Admitting: Dietician

## 2014-02-14 ENCOUNTER — Encounter: Payer: Medicare Other | Admitting: Dietician

## 2014-02-17 NOTE — Addendum Note (Signed)
Addended by: Neomia Dear on: 02/17/2014 05:38 PM   Modules accepted: Orders

## 2014-03-27 ENCOUNTER — Encounter: Payer: Self-pay | Admitting: *Deleted

## 2014-03-31 ENCOUNTER — Ambulatory Visit: Payer: Medicare Other | Admitting: Podiatry

## 2014-06-04 ENCOUNTER — Ambulatory Visit (INDEPENDENT_AMBULATORY_CARE_PROVIDER_SITE_OTHER): Payer: Medicare Other | Admitting: Podiatry

## 2014-06-04 ENCOUNTER — Encounter: Payer: Self-pay | Admitting: Podiatry

## 2014-06-04 VITALS — BP 148/87 | HR 83 | Resp 15

## 2014-06-04 DIAGNOSIS — E1151 Type 2 diabetes mellitus with diabetic peripheral angiopathy without gangrene: Secondary | ICD-10-CM

## 2014-06-04 DIAGNOSIS — M14672 Charcot's joint, left ankle and foot: Secondary | ICD-10-CM | POA: Diagnosis not present

## 2014-06-04 DIAGNOSIS — Q828 Other specified congenital malformations of skin: Secondary | ICD-10-CM | POA: Diagnosis not present

## 2014-06-04 NOTE — Progress Notes (Signed)
Subjective:     Patient ID: [name], female   DOB: [birth year], 59 y.o.   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

## 2014-06-25 ENCOUNTER — Telehealth: Payer: Self-pay | Admitting: Pulmonary Disease

## 2014-06-25 NOTE — Telephone Encounter (Signed)
Call to patient to confirm appointment for 06/26/14 at 1:45 lmtcb °

## 2014-06-26 ENCOUNTER — Ambulatory Visit (INDEPENDENT_AMBULATORY_CARE_PROVIDER_SITE_OTHER): Payer: Medicare Other | Admitting: Pulmonary Disease

## 2014-06-26 ENCOUNTER — Encounter: Payer: Self-pay | Admitting: Pulmonary Disease

## 2014-06-26 VITALS — BP 164/77 | HR 73 | Temp 98.1°F | Ht 70.0 in | Wt 262.6 lb

## 2014-06-26 DIAGNOSIS — D649 Anemia, unspecified: Secondary | ICD-10-CM

## 2014-06-26 DIAGNOSIS — E119 Type 2 diabetes mellitus without complications: Secondary | ICD-10-CM | POA: Diagnosis not present

## 2014-06-26 DIAGNOSIS — I10 Essential (primary) hypertension: Secondary | ICD-10-CM

## 2014-06-26 DIAGNOSIS — K219 Gastro-esophageal reflux disease without esophagitis: Secondary | ICD-10-CM

## 2014-06-26 LAB — POCT GLYCOSYLATED HEMOGLOBIN (HGB A1C): Hemoglobin A1C: 6.3

## 2014-06-26 LAB — GLUCOSE, CAPILLARY: Glucose-Capillary: 98 mg/dL (ref 65–99)

## 2014-06-26 MED ORDER — FUROSEMIDE 40 MG PO TABS: 40.0000 mg | ORAL_TABLET | Freq: Every day | ORAL | Status: DC

## 2014-06-26 MED ORDER — SITAGLIPTIN PHOSPHATE 100 MG PO TABS: 100.0000 mg | ORAL_TABLET | Freq: Every day | ORAL | Status: DC

## 2014-06-26 MED ORDER — PANTOPRAZOLE SODIUM 40 MG PO TBEC: DELAYED_RELEASE_TABLET | ORAL | Status: DC

## 2014-06-26 MED ORDER — LISINOPRIL 20 MG PO TABS: 20.0000 mg | ORAL_TABLET | Freq: Every day | ORAL | Status: DC

## 2014-06-26 MED ORDER — METFORMIN HCL 1000 MG PO TABS: 500.0000 mg | ORAL_TABLET | Freq: Two times a day (BID) | ORAL | Status: DC

## 2014-06-26 NOTE — Patient Instructions (Signed)
General Instructions:   Please bring your medicines with you each time you come to clinic.  Medicines may include prescription medications, over-the-counter medications, herbal remedies, eye drops, vitamins, or other pills.   Progress Toward Treatment Goals:  Treatment Goal 06/26/2014  Hemoglobin A1C at goal  Blood pressure at goal    Self Care Goals & Plans:  Self Care Goal 06/26/2014  Manage my medications take my medicines as prescribed; bring my medications to every visit; refill my medications on time  Monitor my health keep track of my blood glucose; bring my glucose meter and log to each visit  Eat healthy foods eat more vegetables; eat foods that are low in salt; eat baked foods instead of fried foods  Be physically active take a walk every day  Meeting treatment goals -    Home Blood Glucose Monitoring 04/16/2013  Check my blood sugar once a day  When to check my blood sugar before breakfast; before dinner     Care Management & Community Referrals:  Referral 06/26/2014  Referrals made for care management support none needed

## 2014-06-26 NOTE — Progress Notes (Signed)
Subjective:   Patient ID: [name], female    DOB: 10-18-1955, 59 y.o.   MRN: [id number]  HPI Ms. ROBENA EWY is a 59 year old woman with history of hypertension, hyperlipidemia, diabetes type 2, chronic kidney disease stage III here for routine follow-up. She has no complaints.   Review of Systems Constitutional: no fevers/chills Eyes: no vision changes Ears, nose, mouth, throat, and face: no cough Respiratory: no shortness of breath Cardiovascular: no chest pain Gastrointestinal: no nausea/vomiting, no abdominal pain, no constipation, no diarrhea Genitourinary: no dysuria, no hematuria Integument: no rash Hematologic/lymphatic: no bleeding/bruising, no edema Musculoskeletal: no arthralgias, no myalgias Neurological: no paresthesias, no weakness  Past Medical History  Diagnosis Date  . Normocytic anemia   . Hypertension   . Foot ulcer, left   . Leg edema   . Glaucoma   . GERD (gastroesophageal reflux disease)   . High cholesterol   . Type II diabetes mellitus   . OSA on CPAP   . H/O hiatal hernia   . Arthritis     "right leg" (01/30/2013)  . CKD (chronic kidney disease), stage III     /notes 01/30/2013    Current Outpatient Prescriptions on File Prior to Visit  Medication Sig Dispense Refill  . acetaminophen (TYLENOL) 325 MG tablet Take 1 tablet (325 mg total) by mouth every 6 (six) hours as needed. For back pain. 30 tablet   . aspirin EC 81 MG tablet Take 81 mg by mouth daily.    . Blood Glucose Monitoring Suppl (ACCU-CHEK AVIVA PLUS) W/DEVICE KIT Use to Check Blood Sugars Daily. Dx Code: E11.9. 1 kit 0  . Calcium Carbonate-Vitamin D 600-400 MG-UNIT per chew tablet Chew 1 tablet by mouth 2 (two) times daily. 30 tablet 11  . COMBIGAN 0.2-0.5 % ophthalmic solution Place 1 drop into both eyes 2 (two) times daily.    . ferrous sulfate 325 (65 FE) MG tablet Take 1 tablet (325 mg total) by mouth daily. With orange juice 30 tablet 0  . furosemide (LASIX) 40 MG  tablet Take 1 tablet (40 mg total) by mouth daily. 30 tablet 3  . glucose blood (ACCU-CHEK AVIVA PLUS) test strip Use to check blood sugar 1 time daily. Dx code E11.9. Non insulin dependent 50 each 3  . glucose blood (ACCU-CHEK AVIVA PLUS) test strip Use to check blood sugar 1 time daily. diag code E11.9. Non insulin dependent 100 each 3  . lisinopril (PRINIVIL,ZESTRIL) 20 MG tablet Take 1 tablet (20 mg total) by mouth daily. 30 tablet 3  . LUMIGAN 0.01 % SOLN Place 1 drop into both eyes every evening.    . metFORMIN (GLUCOPHAGE) 1000 MG tablet Take 0.5 tablets (500 mg total) by mouth 2 (two) times daily with a meal. 180 tablet 3  . Multiple Vitamin (MULTIVITAMIN) tablet Take 1 tablet by mouth daily.     . pantoprazole (PROTONIX) 40 MG tablet TAKE 1 TABLET BY MOUTH DAILY 90 tablet 4  . sitaGLIPtin (JANUVIA) 100 MG tablet Take 1 tablet (100 mg total) by mouth daily. 90 tablet 3  . [DISCONTINUED] pravastatin (PRAVACHOL) 20 MG tablet Take 1 tablet (20 mg total) by mouth daily. 30 tablet 6   No current facility-administered medications on file prior to visit.    Today's Vitals   06/26/14 1352  BP: 164/77  Pulse: 73  Temp: 98.1 F (36.7 C)  TempSrc: Oral  Height: _0  (1.778 m)  Weight: 262 lb 9.6 oz (119.115 kg)  SpO2: 100%  PainSc: 0-No pain    Objective:  Physical Exam  Constitutional: She is oriented to person, place, and time. She appears well-developed and well-nourished. No distress.  HENT:  Head: Normocephalic and atraumatic.  Mouth/Throat: Oropharynx is clear and moist. No oropharyngeal exudate.  Eyes: EOM are normal. Pupils are equal, round, and reactive to light.  Neck: Neck supple. No thyromegaly present.  Cardiovascular: Normal rate and regular rhythm.   No murmur heard. Pulses:      Dorsalis pedis pulses are 1+ on the right side, and 1+ on the left side.       Posterior tibial pulses are 1+ on the right side, and 1+ on the left side.  Pulmonary/Chest: Breath sounds  normal. She has no wheezes. She has no rales.  Abdominal: Soft. She exhibits no distension. There is no tenderness.  Musculoskeletal: Normal range of motion. She exhibits no edema.  Neurological: She is alert and oriented to person, place, and time. Coordination normal.  Skin: Skin is warm and dry.  Psychiatric: She has a normal mood and affect.    Assessment & Plan:  Please refer to problem based charting.

## 2014-06-27 NOTE — Assessment & Plan Note (Signed)
>>  ASSESSMENT AND PLAN FOR NORMOCYTIC ANEMIA WRITTEN ON 06/27/2014  1:10 PM BY Lora Paula, MD  Yearly screening Hemoccult cards provided

## 2014-06-27 NOTE — Assessment & Plan Note (Signed)
Lab Results  Component Value Date   HGBA1C 6.3 06/26/2014   HGBA1C 6.5 12/26/2013   HGBA1C 6.7 09/11/2013     Assessment: Diabetes control: good control (HgbA1C at goal) Progress toward A1C goal:  at goal Comments: No episodes of symptomatic hypoglycemia.  Plan: Medications:  continue current medications including Januvia 100 mg daily and metformin 500 mg twice a day Other plans:  -Follow up in 6 months

## 2014-06-27 NOTE — Assessment & Plan Note (Signed)
BP Readings from Last 3 Encounters:  06/26/14 164/77  06/04/14 148/87  12/30/13 134/73    Lab Results  Component Value Date   NA 137 09/11/2013   K 4.4 09/11/2013   CREATININE 1.35* 09/11/2013   Upon recheck her blood pressure was 129/64  Assessment: Blood pressure control: controlled Progress toward BP goal:  at goal  Plan: Medications:  Continue current medications: Lasix 40 mg daily and lisinopril 20 mg daily

## 2014-06-27 NOTE — Assessment & Plan Note (Signed)
Chronic stable. Refilled pantoprazole 40 mg daily.

## 2014-06-27 NOTE — Assessment & Plan Note (Addendum)
Yearly screening Hemoccult cards provided

## 2014-06-27 NOTE — Progress Notes (Signed)
Medicine attending: Medical history, presenting problems, physical findings, and medications, reviewed with Dr Jennifer Krall and I concur with her evaluation and management plan. 

## 2014-07-02 ENCOUNTER — Ambulatory Visit (INDEPENDENT_AMBULATORY_CARE_PROVIDER_SITE_OTHER): Payer: Medicare Other | Admitting: Pulmonary Disease

## 2014-07-02 ENCOUNTER — Encounter: Payer: Self-pay | Admitting: Pulmonary Disease

## 2014-07-02 VITALS — BP 146/66 | HR 77 | Temp 98.2°F | Ht 70.0 in | Wt 261.7 lb

## 2014-07-02 DIAGNOSIS — J019 Acute sinusitis, unspecified: Secondary | ICD-10-CM

## 2014-07-02 MED ORDER — HYDROCOD POLST-CPM POLST ER 10-8 MG/5ML PO SUER: 5.0000 mL | Freq: Two times a day (BID) | ORAL | Status: DC | PRN

## 2014-07-02 MED ORDER — FLUTICASONE PROPIONATE 50 MCG/ACT NA SUSP: 2.0000 | Freq: Every day | NASAL | Status: DC

## 2014-07-02 NOTE — Patient Instructions (Signed)
Acute Viral Rhinosinusitis - You may use tylenol for fevers or pain - Use fluticasone nasal spray for congestion and runny nose -You may use cough syrup as needed for cough -If you do not feel better in 7-10 days, please call the clinic.

## 2014-07-02 NOTE — Progress Notes (Signed)
Subjective:   Patient ID: [name], female    DOB: January 22, 1955, 59 y.o.   MRN: [id number]  HPI [name] is a 59 year old woman with history of hypertension, hyperlipidemia, diabetes type 2, chronic kidney disease stage III here for evaluation.  She reports congestion and cough since Sunday night. She has sinus pain, rhinorrhea, and postnasal drip. She has had subjective fevers, chills, headache, myalgias. Cough is productive of yellow sputum. No nausea. She has posttussive emesis. She reports sore throat. Denies shortness of breath, chest pain, diarrhea or dysuria. She has been in contact with one sick person who had a respiratory infection. She has taken cough syrup DM which has helped with her cough. She reports she has had symptoms like this before when she had the flu.  Review of Systems Constitutional: + subjective fevers/chills Eyes: no vision changes Ears, nose, mouth, throat, and face: + cough Respiratory: no shortness of breath Cardiovascular: no chest pain Gastrointestinal: no nausea/vomiting, no abdominal pain, no constipation, no diarrhea Genitourinary: no dysuria, no hematuria Integument: no rash Hematologic/lymphatic: no bleeding/bruising, no edema Musculoskeletal: no arthralgias, + myalgias Neurological: no paresthesias, no weakness  Past Medical History  Diagnosis Date  . Normocytic anemia   . Hypertension   . Foot ulcer, left   . Leg edema   . Glaucoma   . GERD (gastroesophageal reflux disease)   . High cholesterol   . Type II diabetes mellitus   . OSA on CPAP   . H/O hiatal hernia   . Arthritis     "right leg" (01/30/2013)  . CKD (chronic kidney disease), stage III     /notes 01/30/2013    Current Outpatient Prescriptions on File Prior to Visit  Medication Sig Dispense Refill  . acetaminophen (TYLENOL) 325 MG tablet Take 1 tablet (325 mg total) by mouth every 6 (six) hours as needed. For back pain. 30 tablet   . aspirin EC 81 MG tablet  Take 81 mg by mouth daily.    . Blood Glucose Monitoring Suppl (ACCU-CHEK AVIVA PLUS) W/DEVICE KIT Use to Check Blood Sugars Daily. Dx Code: E11.9. 1 kit 0  . Calcium Carbonate-Vitamin D 600-400 MG-UNIT per chew tablet Chew 1 tablet by mouth 2 (two) times daily. 30 tablet 11  . COMBIGAN 0.2-0.5 % ophthalmic solution Place 1 drop into both eyes 2 (two) times daily.    . ferrous sulfate 325 (65 FE) MG tablet Take 1 tablet (325 mg total) by mouth daily. With orange juice 30 tablet 0  . furosemide (LASIX) 40 MG tablet Take 1 tablet (40 mg total) by mouth daily. 30 tablet 3  . glucose blood (ACCU-CHEK AVIVA PLUS) test strip Use to check blood sugar 1 time daily. Dx code E11.9. Non insulin dependent 50 each 3  . glucose blood (ACCU-CHEK AVIVA PLUS) test strip Use to check blood sugar 1 time daily. diag code E11.9. Non insulin dependent 100 each 3  . lisinopril (PRINIVIL,ZESTRIL) 20 MG tablet Take 1 tablet (20 mg total) by mouth daily. 30 tablet 3  . LUMIGAN 0.01 % SOLN Place 1 drop into both eyes every evening.    . metFORMIN (GLUCOPHAGE) 1000 MG tablet Take 0.5 tablets (500 mg total) by mouth 2 (two) times daily with a meal. 180 tablet 3  . Multiple Vitamin (MULTIVITAMIN) tablet Take 1 tablet by mouth daily.     . pantoprazole (PROTONIX) 40 MG tablet TAKE 1 TABLET BY MOUTH DAILY 90 tablet 4  . sitaGLIPtin (JANUVIA) 100 MG  tablet Take 1 tablet (100 mg total) by mouth daily. 90 tablet 3  . [DISCONTINUED] pravastatin (PRAVACHOL) 20 MG tablet Take 1 tablet (20 mg total) by mouth daily. 30 tablet 6   No current facility-administered medications on file prior to visit.    Today's Vitals   07/02/14 0906  BP: 146/66  Pulse: 77  Temp: 98.2 F (36.8 C)  TempSrc: Oral  Height: 5' 10"  (1.778 m)  Weight: 261 lb 11.2 oz (118.706 kg)  SpO2: 100%  PainSc: 0-No pain   Objective:  Physical Exam  Constitutional: She is oriented to person, place, and time. She appears well-developed and well-nourished. No  distress.  HENT:  Head: Normocephalic and atraumatic.  Mouth/Throat: Oropharynx is clear and moist. No oropharyngeal exudate.  Eyes: EOM are normal. Pupils are equal, round, and reactive to light.  Neck: Neck supple. No thyromegaly present.  Cardiovascular: Normal rate and regular rhythm.   No murmur heard. Pulmonary/Chest: Breath sounds normal. She has no wheezes. She has no rales.  Abdominal: Soft. She exhibits no distension. There is no tenderness.  Musculoskeletal: Normal range of motion. She exhibits no edema.  Neurological: She is alert and oriented to person, place, and time. Coordination normal.  Skin: Skin is warm and dry.  Psychiatric: She has a normal mood and affect.    Assessment & Plan:  Please refer to problem based charting.

## 2014-07-02 NOTE — Progress Notes (Signed)
[facility] Medicine Clinic Attending  Case discussed with Dr. Krall at the time of the visit.  We reviewed the resident's history and exam and pertinent patient test results.  I agree with the assessment, diagnosis, and plan of care documented in the resident's note.  

## 2014-07-02 NOTE — Assessment & Plan Note (Signed)
Likely acute viral rhinosinusitis.  Plan: -Tylenol as needed for pain or fever -Fluticasone 50 g 2 sprays daily -Tussionex Pennkinetic ER 10-8 mg take 5 ml by mouth every 12 hours as needed for cough -Patient instructed to call clinic if symptoms fail to resolve or worsen

## 2014-07-14 ENCOUNTER — Ambulatory Visit (INDEPENDENT_AMBULATORY_CARE_PROVIDER_SITE_OTHER): Payer: Medicare Other | Admitting: Podiatry

## 2014-07-14 DIAGNOSIS — E08621 Diabetes mellitus due to underlying condition with foot ulcer: Secondary | ICD-10-CM

## 2014-07-14 DIAGNOSIS — M14672 Charcot's joint, left ankle and foot: Secondary | ICD-10-CM | POA: Diagnosis not present

## 2014-07-14 DIAGNOSIS — L97529 Non-pressure chronic ulcer of other part of left foot with unspecified severity: Secondary | ICD-10-CM

## 2014-07-16 NOTE — Progress Notes (Signed)
Subjective:     Patient ID: [name], female   DOB: 05-12-1955, 59 y.o.   MRN: [id number]  HPI patient was concerned because she has a blister on the bottom of the left foot and she just wanted to make sure it was not infected   Review of Systems     Objective:   Physical Exam Neurovascular status intact with a large blistered area plantar aspect left heel localized in nature with no subcutaneous exposure no odor or no proximal edema erythema or drainage noted    Assessment:     Probable friction blister left that seems to be doing okay    Plan:     Advised patient on condition and at this time explain what to look for if infection were to occur. Went ahead today and applied Silvadene with sterile dressing and patient will be seen back for is to recheck

## 2014-08-15 ENCOUNTER — Emergency Department (HOSPITAL_COMMUNITY)
Admission: EM | Admit: 2014-08-15 | Discharge: 2014-08-15 | Disposition: A | Discharge status: Home / Self Care | Payer: Medicare Other | Attending: Emergency Medicine | Admitting: Emergency Medicine

## 2014-08-15 ENCOUNTER — Encounter (HOSPITAL_COMMUNITY): Payer: Self-pay | Admitting: Emergency Medicine

## 2014-08-15 DIAGNOSIS — Z7982 Long term (current) use of aspirin: Secondary | ICD-10-CM | POA: Insufficient documentation

## 2014-08-15 DIAGNOSIS — N183 Chronic kidney disease, stage 3 (moderate): Secondary | ICD-10-CM | POA: Insufficient documentation

## 2014-08-15 DIAGNOSIS — H409 Unspecified glaucoma: Secondary | ICD-10-CM | POA: Diagnosis not present

## 2014-08-15 DIAGNOSIS — Z87891 Personal history of nicotine dependence: Secondary | ICD-10-CM | POA: Diagnosis not present

## 2014-08-15 DIAGNOSIS — E119 Type 2 diabetes mellitus without complications: Secondary | ICD-10-CM | POA: Diagnosis not present

## 2014-08-15 DIAGNOSIS — Z79899 Other long term (current) drug therapy: Secondary | ICD-10-CM | POA: Diagnosis not present

## 2014-08-15 DIAGNOSIS — Z872 Personal history of diseases of the skin and subcutaneous tissue: Secondary | ICD-10-CM | POA: Insufficient documentation

## 2014-08-15 DIAGNOSIS — Z4800 Encounter for change or removal of nonsurgical wound dressing: Secondary | ICD-10-CM | POA: Insufficient documentation

## 2014-08-15 DIAGNOSIS — Z9981 Dependence on supplemental oxygen: Secondary | ICD-10-CM | POA: Diagnosis not present

## 2014-08-15 DIAGNOSIS — Z5189 Encounter for other specified aftercare: Secondary | ICD-10-CM

## 2014-08-15 DIAGNOSIS — Z7951 Long term (current) use of inhaled steroids: Secondary | ICD-10-CM | POA: Diagnosis not present

## 2014-08-15 DIAGNOSIS — G4733 Obstructive sleep apnea (adult) (pediatric): Secondary | ICD-10-CM | POA: Diagnosis not present

## 2014-08-15 DIAGNOSIS — D649 Anemia, unspecified: Secondary | ICD-10-CM | POA: Diagnosis not present

## 2014-08-15 DIAGNOSIS — I129 Hypertensive chronic kidney disease with stage 1 through stage 4 chronic kidney disease, or unspecified chronic kidney disease: Secondary | ICD-10-CM | POA: Diagnosis not present

## 2014-08-15 DIAGNOSIS — M1990 Unspecified osteoarthritis, unspecified site: Secondary | ICD-10-CM | POA: Insufficient documentation

## 2014-08-15 DIAGNOSIS — K219 Gastro-esophageal reflux disease without esophagitis: Secondary | ICD-10-CM | POA: Diagnosis not present

## 2014-08-15 NOTE — Discharge Instructions (Signed)
Diabetes and Foot Care Diabetes may cause you to have problems because of poor blood supply (circulation) to your feet and legs. This may cause the skin on your feet to become thinner, break easier, and heal more slowly. Your skin may become dry, and the skin may peel and crack. You may also have nerve damage in your legs and feet causing decreased feeling in them. You may not notice minor injuries to your feet that could lead to infections or more serious problems. Taking care of your feet is one of the most important things you can do for yourself.  HOME CARE INSTRUCTIONS  Wear shoes at all times, even in the house. Do not go barefoot. Bare feet are easily injured.  Check your feet daily for blisters, cuts, and redness. If you cannot see the bottom of your feet, use a mirror or ask someone for help.  Wash your feet with warm water (do not use hot water) and mild soap. Then pat your feet and the areas between your toes until they are completely dry. Do not soak your feet as this can dry your skin.  Apply a moisturizing lotion or petroleum jelly (that does not contain alcohol and is unscented) to the skin on your feet and to dry, brittle toenails. Do not apply lotion between your toes.  Trim your toenails straight across. Do not dig under them or around the cuticle. File the edges of your nails with an emery board or nail file.  Do not cut corns or calluses or try to remove them with medicine.  Wear clean socks or stockings every day. Make sure they are not too tight. Do not wear knee-high stockings since they may decrease blood flow to your legs.  Wear shoes that fit properly and have enough cushioning. To break in new shoes, wear them for just a few hours a day. This prevents you from injuring your feet. Always look in your shoes before you put them on to be sure there are no objects inside.  Do not cross your legs. This may decrease the blood flow to your feet.  If you find a minor scrape,  cut, or break in the skin on your feet, keep it and the skin around it clean and dry. These areas may be cleansed with mild soap and water. Do not cleanse the area with peroxide, alcohol, or iodine.  When you remove an adhesive bandage, be sure not to damage the skin around it.  If you have a wound, look at it several times a day to make sure it is healing.  Do not use heating pads or hot water bottles. They may burn your skin. If you have lost feeling in your feet or legs, you may not know it is happening until it is too late.  Make sure your health care provider performs a complete foot exam at least annually or more often if you have foot problems. Report any cuts, sores, or bruises to your health care provider immediately. SEEK MEDICAL CARE IF:   You have an injury that is not healing.  You have cuts or breaks in the skin.  You have an ingrown nail.  You notice redness on your legs or feet.  You feel burning or tingling in your legs or feet.  You have pain or cramps in your legs and feet.  Your legs or feet are numb.  Your feet always feel cold. SEEK IMMEDIATE MEDICAL CARE IF:   There is increasing redness,   swelling, or pain in or around a wound.  There is a red line that goes up your leg.  Pus is coming from a wound.  You develop a fever or as directed by your health care provider.  You notice a bad smell coming from an ulcer or wound. Document Released: 01/01/2000 Document Revised: 09/05/2012 Document Reviewed: 06/12/2012 [employer] Patient Information 2015 [employer]. This information is not intended to replace advice given to you by your health care provider. Make sure you discuss any questions you have with your health care provider.  

## 2014-08-15 NOTE — ED Provider Notes (Signed)
CSN: [id number]     Arrival date & time 08/15/14  1047 History  This chart was scribed for non-physician practitioner, Montine Circle, PA-C, working with Tanna Furry, MD by Ladene Artist, ED Scribe. This patient was seen in room TR05C/TR05C and the patient's care was started at 10:56 AM.   Chief Complaint  Patient presents with  . Wound Check   The history is provided by the patient. No language interpreter was used.   HPI Comments: [name] is a 59 y.o. female, with a h/o HTN and DM, who presents to the Emergency Department for a wound check. Pt reports a wound to left foot for the past month. She is seen at the wound center but was unable to be seen today. Pt reports drainage and odor from the affected area. She states that the area is nonpainful. She wanted to ensure that there is no evidence of infection. She has follow-up with the wound clinic in 2 weeks.  Past Medical History  Diagnosis Date  . Normocytic anemia   . Hypertension   . Foot ulcer, left   . Leg edema   . Glaucoma   . GERD (gastroesophageal reflux disease)   . High cholesterol   . Type II diabetes mellitus   . OSA on CPAP   . H/O hiatal hernia   . Arthritis     "right leg" (01/30/2013)  . CKD (chronic kidney disease), stage III     /notes 01/30/2013   Past Surgical History  Procedure Laterality Date  . Oophorectomy Bilateral 1975-1976  . Appendectomy  1975  . I&d extremity Left 2011    "ulcer on my foot got infected" (01/30/2013)  . Multiple tooth extractions Bilateral    Family History  Problem Relation Age of Onset  . Diabetes Mother   . Hypertension Mother   . Heart disease Father   . Stroke Father   . Diabetes Father   . Hypertension Father   . Gout Father   . Deep vein thrombosis Father    History  Substance Use Topics  . Smoking status: Former Smoker -- 0.50 packs/day for 4 years    Types: Cigarettes    Quit date: 12/22/1972  . Smokeless tobacco: Never Used  . Alcohol Use: No   OB  History    No data available     Review of Systems  Constitutional: Negative for fever and chills.  Respiratory: Negative for shortness of breath.   Cardiovascular: Negative for chest pain.  Gastrointestinal: Negative for nausea, vomiting, diarrhea and constipation.  Genitourinary: Negative for dysuria.  Skin: Positive for wound.   Allergies  Codeine and Pravastatin  Home Medications   Prior to Admission medications   Medication Sig Start Date End Date Taking? Authorizing Provider  acetaminophen (TYLENOL) 325 MG tablet Take 1 tablet (325 mg total) by mouth every 6 (six) hours as needed. For back pain. 02/20/12   Rosalia Hammers, MD  aspirin EC 81 MG tablet Take 81 mg by mouth daily.    Historical Provider, MD  Blood Glucose Monitoring Suppl (ACCU-CHEK AVIVA PLUS) W/DEVICE KIT Use to Check Blood Sugars Daily. Dx Code: E11.9. 10/29/13   Aldine Contes, MD  Calcium Carbonate-Vitamin D 600-400 MG-UNIT per chew tablet Chew 1 tablet by mouth 2 (two) times daily. 05/26/10 05/26/11  Trish Fountain, MD  chlorpheniramine-HYDROcodone (TUSSIONEX PENNKINETIC ER) 10-8 MG/5ML SUER Take 5 mLs by mouth every 12 (twelve) hours as needed for cough. 07/02/14   Milagros Loll, MD  [facility]  0.2-0.5 % ophthalmic solution Place 1 drop into both eyes 2 (two) times daily. 12/19/13   Historical Provider, MD  ferrous sulfate 325 (65 FE) MG tablet Take 1 tablet (325 mg total) by mouth daily. With orange juice 12/26/13   Milagros Loll, MD  fluticasone [facility]) 50 MCG/ACT nasal spray Place 2 sprays into both nostrils daily. 07/02/14   Milagros Loll, MD  furosemide (LASIX) 40 MG tablet Take 1 tablet (40 mg total) by mouth daily. 06/26/14   Milagros Loll, MD  glucose blood (ACCU-CHEK AVIVA PLUS) test strip Use to check blood sugar 1 time daily. Dx code E11.9. Non insulin dependent 12/28/13   Milagros Loll, MD  glucose blood (ACCU-CHEK AVIVA PLUS) test strip Use to check blood sugar 1 time daily. diag code  E11.9. Non insulin dependent 12/26/13   Milagros Loll, MD  lisinopril (PRINIVIL,ZESTRIL) 20 MG tablet Take 1 tablet (20 mg total) by mouth daily. 06/26/14   Milagros Loll, MD  LUMIGAN 0.01 % SOLN Place 1 drop into both eyes every evening. 12/28/12   Historical Provider, MD  metFORMIN (GLUCOPHAGE) 1000 MG tablet Take 0.5 tablets (500 mg total) by mouth 2 (two) times daily with a meal. 06/26/14   Milagros Loll, MD  Multiple Vitamin (MULTIVITAMIN) tablet Take 1 tablet by mouth daily.     Historical Provider, MD  pantoprazole (PROTONIX) 40 MG tablet TAKE 1 TABLET BY MOUTH DAILY 06/26/14   Milagros Loll, MD  sitaGLIPtin (JANUVIA) 100 MG tablet Take 1 tablet (100 mg total) by mouth daily. 06/26/14   Milagros Loll, MD   BP 123/84 mmHg  Pulse 67  Temp(Src) 97.8 F (36.6 C) (Oral)  Resp 18  SpO2 100% Physical Exam  Constitutional: She is oriented to person, place, and time. She appears well-developed and well-nourished. No distress.  HENT:  Head: Normocephalic and atraumatic.  Eyes: Conjunctivae and EOM are normal.  Neck: Neck supple. No tracheal deviation present.  Cardiovascular: Normal rate.   Pulmonary/Chest: Effort normal. No respiratory distress.  Musculoskeletal: Normal range of motion.  Neurological: She is alert and oriented to person, place, and time.  Skin: Skin is warm and dry.  As pictured, no discharge, cellulitis, or sign of infection  Psychiatric: She has a normal mood and affect. Her behavior is normal.  Nursing note and vitals reviewed.    ED Course  Procedures (including critical care time) DIAGNOSTIC STUDIES: Oxygen Saturation is 100% on RA, normal by my interpretation.    COORDINATION OF CARE: 11:10 AM-Discussed treatment plan which includes follow-up with wound center with pt at bedside and pt agreed to plan.   Labs Review Labs Reviewed - No data to display  Imaging Review No results found.   EKG Interpretation None      MDM   Final diagnoses:   Encounter for wound re-check    Patient here for wound check. Wound is not draining, there is no evidence of infection or cellulitis. It is nontender to palpation. There is no evidence of deep space infection. Will discharge to home with wound care follow-up. Patient understands and agrees with the plan. She is stable and ready for discharge.  I personally performed the services described in this documentation, which was scribed in my presence. The recorded information has been reviewed and is accurate.      Montine Circle, PA-C 08/15/14 [place], MD 08/16/14 [phone]

## 2014-08-15 NOTE — ED Notes (Signed)
Awaiting supplies by wound nurse to apply for pt's discharge.

## 2014-08-15 NOTE — ED Notes (Signed)
Allevyn foam pad placed per recommendation from wound nurse with extra pad given to pt to apply at home.  Pt verbalized understanding.

## 2014-08-15 NOTE — ED Notes (Signed)
Pt goes to wound center. Was unable to get in today. Has 'new wound' to left foot. States it was 'worked on' 1 month ago but now is draining with odor.

## 2014-08-29 ENCOUNTER — Encounter (HOSPITAL_BASED_OUTPATIENT_CLINIC_OR_DEPARTMENT_OTHER): Payer: Medicare Other | Attending: Internal Medicine

## 2014-08-29 ENCOUNTER — Ambulatory Visit (HOSPITAL_COMMUNITY)
Admission: RE | Admit: 2014-08-29 | Discharge: 2014-08-29 | Disposition: A | Discharge status: Home / Self Care | Payer: Medicare Other | Source: Ambulatory Visit | Attending: General Surgery | Admitting: General Surgery

## 2014-08-29 ENCOUNTER — Other Ambulatory Visit (HOSPITAL_BASED_OUTPATIENT_CLINIC_OR_DEPARTMENT_OTHER): Payer: Self-pay | Admitting: General Surgery

## 2014-08-29 DIAGNOSIS — S91302A Unspecified open wound, left foot, initial encounter: Secondary | ICD-10-CM | POA: Diagnosis not present

## 2014-08-29 DIAGNOSIS — M869 Osteomyelitis, unspecified: Secondary | ICD-10-CM

## 2014-08-29 DIAGNOSIS — I10 Essential (primary) hypertension: Secondary | ICD-10-CM | POA: Insufficient documentation

## 2014-08-29 DIAGNOSIS — L97429 Non-pressure chronic ulcer of left heel and midfoot with unspecified severity: Secondary | ICD-10-CM | POA: Insufficient documentation

## 2014-08-29 DIAGNOSIS — E1140 Type 2 diabetes mellitus with diabetic neuropathy, unspecified: Secondary | ICD-10-CM | POA: Diagnosis not present

## 2014-08-29 DIAGNOSIS — X58XXXA Exposure to other specified factors, initial encounter: Secondary | ICD-10-CM | POA: Diagnosis not present

## 2014-08-29 DIAGNOSIS — E11621 Type 2 diabetes mellitus with foot ulcer: Secondary | ICD-10-CM | POA: Diagnosis not present

## 2014-08-29 DIAGNOSIS — M069 Rheumatoid arthritis, unspecified: Secondary | ICD-10-CM | POA: Insufficient documentation

## 2014-08-29 DIAGNOSIS — Z8631 Personal history of diabetic foot ulcer: Secondary | ICD-10-CM | POA: Diagnosis not present

## 2014-08-29 DIAGNOSIS — D649 Anemia, unspecified: Secondary | ICD-10-CM | POA: Diagnosis not present

## 2014-08-29 DIAGNOSIS — G4733 Obstructive sleep apnea (adult) (pediatric): Secondary | ICD-10-CM | POA: Diagnosis not present

## 2014-09-01 DIAGNOSIS — G4733 Obstructive sleep apnea (adult) (pediatric): Secondary | ICD-10-CM | POA: Diagnosis not present

## 2014-09-01 DIAGNOSIS — I10 Essential (primary) hypertension: Secondary | ICD-10-CM | POA: Diagnosis not present

## 2014-09-01 DIAGNOSIS — E11621 Type 2 diabetes mellitus with foot ulcer: Secondary | ICD-10-CM | POA: Diagnosis not present

## 2014-09-01 DIAGNOSIS — L97429 Non-pressure chronic ulcer of left heel and midfoot with unspecified severity: Secondary | ICD-10-CM | POA: Diagnosis not present

## 2014-09-01 DIAGNOSIS — E1140 Type 2 diabetes mellitus with diabetic neuropathy, unspecified: Secondary | ICD-10-CM | POA: Diagnosis not present

## 2014-09-01 DIAGNOSIS — D649 Anemia, unspecified: Secondary | ICD-10-CM | POA: Diagnosis not present

## 2014-09-03 ENCOUNTER — Ambulatory Visit (INDEPENDENT_AMBULATORY_CARE_PROVIDER_SITE_OTHER): Payer: Medicare Other | Admitting: Internal Medicine

## 2014-09-03 ENCOUNTER — Encounter: Payer: Self-pay | Admitting: Internal Medicine

## 2014-09-03 VITALS — BP 109/75 | HR 80 | Temp 98.0°F | Ht 70.0 in | Wt 239.6 lb

## 2014-09-03 DIAGNOSIS — I10 Essential (primary) hypertension: Secondary | ICD-10-CM

## 2014-09-03 DIAGNOSIS — E11621 Type 2 diabetes mellitus with foot ulcer: Secondary | ICD-10-CM | POA: Diagnosis present

## 2014-09-03 DIAGNOSIS — E11610 Type 2 diabetes mellitus with diabetic neuropathic arthropathy: Secondary | ICD-10-CM

## 2014-09-03 DIAGNOSIS — L97429 Non-pressure chronic ulcer of left heel and midfoot with unspecified severity: Secondary | ICD-10-CM

## 2014-09-03 DIAGNOSIS — D649 Anemia, unspecified: Secondary | ICD-10-CM | POA: Diagnosis not present

## 2014-09-03 DIAGNOSIS — L97529 Non-pressure chronic ulcer of other part of left foot with unspecified severity: Principal | ICD-10-CM

## 2014-09-03 DIAGNOSIS — Z0000 Encounter for general adult medical examination without abnormal findings: Secondary | ICD-10-CM

## 2014-09-03 NOTE — Assessment & Plan Note (Signed)
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   Plan:  -Refer to orthopedic surgery ([name]) for further management, pt has appointment tomorrow  -Pt instructed to obtain recently prescribed antibiotic therapy from the pharmacy  -Continue left foot boot and non-weight bearing -Continue frequent dressing changes and topical therapy  -Pt follows with wound care center

## 2014-09-03 NOTE — Assessment & Plan Note (Addendum)
Assessment: Pt with well-controlled hypertension compliant with two-class (ACEi & diuretic) anti-hypertensive therapy who presents with blood pressure of 109/75.   Plan:  -BP 109/75 at goal <140/90 -Continue lasix 40 mg daily and lisinopril 20 mg daily (change to losartan if cough persists) -Obtain CMP at next visit

## 2014-09-03 NOTE — Assessment & Plan Note (Signed)
>>  ASSESSMENT AND PLAN FOR NORMOCYTIC ANEMIA WRITTEN ON 09/03/2014 11:20 PM BY Otis Brace, MD  Assessment: Pt is postmenopausal with chronic normocytic anemia most likely due to CKD Stage 3 with last anemia panel in 2011, normal EGD in 2011, and colonoscopy in 2011 with pandiverticulosis compliant with oral iron supplementation who presents with no active bleeding or hemodynamic instability.   Plan:  -Obtain anemia profile B at next visit (after resolution of infection) -Consider discontinuing ferrous sulfate if ferritin is >100 and Tstat >20% -Pt needs repeat colonoscopy on 01/14/15

## 2014-09-03 NOTE — Patient Instructions (Signed)
-  I put in the referral for orthopedic surgery  -Please pick up your antibiotics from the pharmacy  -Please come back after your surgical debridement for lab work -Very nice meeting you!   General Instructions:   Please bring your medicines with you each time you come to clinic.  Medicines may include prescription medications, over-the-counter medications, herbal remedies, eye drops, vitamins, or other pills.   Progress Toward Treatment Goals:  Treatment Goal 06/26/2014  Hemoglobin A1C at goal  Blood pressure at goal    Self Care Goals & Plans:  Self Care Goal 09/03/2014  Manage my medications take my medicines as prescribed; bring my medications to every visit; refill my medications on time; follow the sick day instructions if I am sick  Monitor my health keep track of my blood glucose; keep track of my blood pressure; keep track of my weight  Eat healthy foods eat more vegetables; eat fruit for snacks and desserts; eat foods that are low in salt; eat baked foods instead of fried foods; eat smaller portions; drink diet soda or water instead of juice or soda  Be physically active find an activity I enjoy  Meeting treatment goals -    Home Blood Glucose Monitoring 04/16/2013  Check my blood sugar once a day  When to check my blood sugar before breakfast; before dinner     Care Management & Community Referrals:  Referral 06/26/2014  Referrals made for care management support none needed

## 2014-09-03 NOTE — Assessment & Plan Note (Addendum)
-  Pt to return for annual influenza vaccination (not yet available)  -Obtain screening HIV and HCV (born b/w 1945-65) at next visit -Pt due for screening coloscopy 01/14/15

## 2014-09-03 NOTE — Progress Notes (Signed)
Patient ID: [name], female   DOB: August 02, 1955, 59 y.o.   MRN: [id number]    Subjective:   Patient ID: [name] female   DOB: 1955-05-15 59 y.o.   MRN: [id number]  HPI: [name] is a 59 y.o. very pleasant woman with past medical history of hypertension, non-insulin dependent Type 2 DM, hyperlipidemia, CKD Stage 3, peripheral neuropathy, normocytic anemia on iron supplementation, GERD, AR, and OSA who presents for referral to orthopedic surgery.   She has history of charcot foot with left plantar diabetic wound ulcer for many years with history of surgical debridement and hyperbaric chamber therapy. She denies past history of osteomyelitis. She reports recent drainage and malodor a few weeks ago and was seen in the ED on 7/29 and did not require antibiotic therapy. She was instructed to follow-up with wound care clinic. She was seen two days ago by wound care clinic who obtained wound cultures that consequently grew MRSA and was prescribed oral antibiotic which she reports is not yet ready at the pharmacy. She was told there no extension to the bone. She is to see orthopedic surgery tomorrow for consultation regarding surgical debridement. She denies recent trauma, injury, or fall. She has had chills and nausea. She wears a boot on her left foot and walks with use of a walker. She has been compliant with dressing changes and topical agents (triple antibiotic). She has chronic peripheral neuropathy and unable to feel pain in her foot. Her last A1c was 6.3 on 06/26/14.  She has questionable history of iron deficiency anemia with normal ferritin level in 2011. She is taking ferrous sulfate daily. She has history of CKD Stage 3 and had coloscnopy in 01/13/10 with repeat recommended in 5 years. She had normal EGD in 2011. She denies hematochezia, BRBPR, hematuria, or vaginal bleeding. She is on aspirin but instructed to hold it for upcoming surgery.   She reports compliance with  taking lisinopril and lasix for hypertension. She has mild left LE edema and occasional dry cough but denies headache, chest pain, or lightheadedness.    Past Medical History  Diagnosis Date  . Normocytic anemia   . Hypertension   . Foot ulcer, left   . Leg edema   . Glaucoma   . GERD (gastroesophageal reflux disease)   . High cholesterol   . Type II diabetes mellitus   . OSA on CPAP   . H/O hiatal hernia   . Arthritis     "right leg" (01/30/2013)  . CKD (chronic kidney disease), stage III     /notes 01/30/2013   Current Outpatient Prescriptions  Medication Sig Dispense Refill  . acetaminophen (TYLENOL) 325 MG tablet Take 1 tablet (325 mg total) by mouth every 6 (six) hours as needed. For back pain. 30 tablet   . aspirin EC 81 MG tablet Take 81 mg by mouth daily.    . Blood Glucose Monitoring Suppl (ACCU-CHEK AVIVA PLUS) W/DEVICE KIT Use to Check Blood Sugars Daily. Dx Code: E11.9. 1 kit 0  . Calcium Carbonate-Vitamin D 600-400 MG-UNIT per chew tablet Chew 1 tablet by mouth 2 (two) times daily. 30 tablet 11  . chlorpheniramine-HYDROcodone (TUSSIONEX PENNKINETIC ER) 10-8 MG/5ML SUER Take 5 mLs by mouth every 12 (twelve) hours as needed for cough. 115 mL 0  . COMBIGAN 0.2-0.5 % ophthalmic solution Place 1 drop into both eyes 2 (two) times daily.    . ferrous sulfate 325 (65 FE) MG tablet Take 1 tablet (325 mg  total) by mouth daily. With orange juice 30 tablet 0  . fluticasone (FLONASE) 50 MCG/ACT nasal spray Place 2 sprays into both nostrils daily. 9.9 g 0  . furosemide (LASIX) 40 MG tablet Take 1 tablet (40 mg total) by mouth daily. 30 tablet 3  . glucose blood (ACCU-CHEK AVIVA PLUS) test strip Use to check blood sugar 1 time daily. Dx code E11.9. Non insulin dependent 50 each 3  . glucose blood (ACCU-CHEK AVIVA PLUS) test strip Use to check blood sugar 1 time daily. diag code E11.9. Non insulin dependent 100 each 3  . lisinopril (PRINIVIL,ZESTRIL) 20 MG tablet Take 1 tablet (20 mg  total) by mouth daily. 30 tablet 3  . LUMIGAN 0.01 % SOLN Place 1 drop into both eyes every evening.    . metFORMIN (GLUCOPHAGE) 1000 MG tablet Take 0.5 tablets (500 mg total) by mouth 2 (two) times daily with a meal. 180 tablet 3  . Multiple Vitamin (MULTIVITAMIN) tablet Take 1 tablet by mouth daily.     . pantoprazole (PROTONIX) 40 MG tablet TAKE 1 TABLET BY MOUTH DAILY 90 tablet 4  . sitaGLIPtin (JANUVIA) 100 MG tablet Take 1 tablet (100 mg total) by mouth daily. 90 tablet 3  . [DISCONTINUED] pravastatin (PRAVACHOL) 20 MG tablet Take 1 tablet (20 mg total) by mouth daily. 30 tablet 6   No current facility-administered medications for this visit.   Family History  Problem Relation Age of Onset  . Diabetes Mother   . Hypertension Mother   . Heart disease Father   . Stroke Father   . Diabetes Father   . Hypertension Father   . Gout Father   . Deep vein thrombosis Father    Social History   Social History  . Marital Status: Single    Spouse Name: N/A  . Number of Children: N/A  . Years of Education: N/A   Social History Main Topics  . Smoking status: Former Smoker -- 0.50 packs/day for 4 years    Types: Cigarettes    Quit date: 12/22/1972  . Smokeless tobacco: Never Used  . Alcohol Use: No  . Drug Use: No  . Sexual Activity: Not Asked   Other Topics Concern  . 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   Smoked when she was a teenager   no alcohol   no Drugs      Financial assistance approved for 100% discount at [facility] and has [facility] card per Bonna Gains   10/30/2009      She was just approved for Disability and is looking for an income controlled housing so she can move out of the shelter.  Living in a rent controlled apartment.    Review of Systems: Review of Systems  Constitutional: Positive for chills and weight loss (intentional). Negative for fever.  Eyes: Negative for blurred vision.    Respiratory: Positive for cough. Negative for shortness of breath and wheezing.   Cardiovascular: Positive for leg swelling (left LE). Negative for chest pain.  Gastrointestinal: Positive for nausea. Negative for vomiting, abdominal pain, diarrhea, constipation, blood in stool and melena.  Genitourinary: Negative for dysuria, urgency, frequency and hematuria.  Musculoskeletal: Negative for myalgias and falls.  Skin:       Left plantar wound infection  Neurological: Positive for sensory change (chronic peripheral neuropathy). Negative for dizziness and headaches.     Objective:  Physical Exam: Filed Vitals:  09/03/14 1504  BP: 109/75  Pulse: 80  Temp: 98 F (36.7 C)  TempSrc: Oral  Height: _0  (1.778 m)  Weight: 239 lb 9.6 oz (108.682 kg)  SpO2: 100%    Physical Exam  Constitutional: She is oriented to person, place, and time. She appears well-developed and well-nourished. No distress.  HENT:  Head: Normocephalic and atraumatic.  Right Ear: External ear normal.  Left Ear: External ear normal.  Nose: Nose normal.  Mouth/Throat: Oropharynx is clear and moist. No oropharyngeal exudate.  Eyes: Conjunctivae and EOM are normal. Pupils are equal, round, and reactive to light. Right eye exhibits no discharge. Left eye exhibits no discharge. No scleral icterus.  Neck: Normal range of motion. Neck supple.  Cardiovascular: Normal rate, regular rhythm and normal heart sounds.   Pulmonary/Chest: Effort normal and breath sounds normal. No respiratory distress. She has no wheezes. She has no rales.  Abdominal: Soft. Bowel sounds are normal. She exhibits no distension and no mass. There is no tenderness. There is no rebound and no guarding.  Musculoskeletal: Normal range of motion. She exhibits edema (trace left LE). She exhibits no tenderness.  Left foot wrapped in dressing with overlying boot, did not examine as recently seen by wound care clinic 2 days ago.  Neurological: She is alert  and oriented to person, place, and time.  Skin: Skin is warm and dry. No rash noted. She is not diaphoretic. No erythema. No pallor.  Psychiatric: She has a normal mood and affect. Her behavior is normal. Judgment and thought content normal.    Assessment & Plan:   Please see problem list for problem-based assessment and plan

## 2014-09-03 NOTE — Assessment & Plan Note (Signed)
Assessment: Pt is postmenopausal with chronic normocytic anemia most likely due to CKD Stage 3 with last anemia panel in 2011, normal EGD in 2011, and colonoscopy in 2011 with pandiverticulosis compliant with oral iron supplementation who presents with no active bleeding or hemodynamic instability.   Plan:  -Obtain anemia profile B at next visit (after resolution of infection) -Consider discontinuing ferrous sulfate if ferritin is >100 and Tstat >20% -Pt needs repeat colonoscopy on 01/14/15

## 2014-09-04 DIAGNOSIS — M14672 Charcot's joint, left ankle and foot: Secondary | ICD-10-CM | POA: Diagnosis not present

## 2014-09-04 DIAGNOSIS — L97523 Non-pressure chronic ulcer of other part of left foot with necrosis of muscle: Secondary | ICD-10-CM | POA: Diagnosis not present

## 2014-09-04 NOTE — Progress Notes (Signed)
Medicine attending: Medical history, presenting problems, physical findings, and medications, reviewed with Dr Marjan Rabbani on the day of the patient visit and I concur with her evaluation and management plan. 

## 2014-09-08 DIAGNOSIS — D649 Anemia, unspecified: Secondary | ICD-10-CM | POA: Diagnosis not present

## 2014-09-08 DIAGNOSIS — G4733 Obstructive sleep apnea (adult) (pediatric): Secondary | ICD-10-CM | POA: Diagnosis not present

## 2014-09-08 DIAGNOSIS — E1140 Type 2 diabetes mellitus with diabetic neuropathy, unspecified: Secondary | ICD-10-CM | POA: Diagnosis not present

## 2014-09-08 DIAGNOSIS — E11621 Type 2 diabetes mellitus with foot ulcer: Secondary | ICD-10-CM | POA: Diagnosis not present

## 2014-09-08 DIAGNOSIS — I10 Essential (primary) hypertension: Secondary | ICD-10-CM | POA: Diagnosis not present

## 2014-09-08 DIAGNOSIS — L97429 Non-pressure chronic ulcer of left heel and midfoot with unspecified severity: Secondary | ICD-10-CM | POA: Diagnosis not present

## 2014-09-12 DIAGNOSIS — M14672 Charcot's joint, left ankle and foot: Secondary | ICD-10-CM | POA: Diagnosis not present

## 2014-09-19 DIAGNOSIS — M14672 Charcot's joint, left ankle and foot: Secondary | ICD-10-CM | POA: Diagnosis not present

## 2014-09-19 DIAGNOSIS — E11610 Type 2 diabetes mellitus with diabetic neuropathic arthropathy: Secondary | ICD-10-CM | POA: Diagnosis not present

## 2014-09-19 DIAGNOSIS — L97523 Non-pressure chronic ulcer of other part of left foot with necrosis of muscle: Secondary | ICD-10-CM | POA: Diagnosis not present

## 2014-09-22 ENCOUNTER — Other Ambulatory Visit: Payer: Self-pay | Admitting: Pulmonary Disease

## 2014-09-25 ENCOUNTER — Encounter (HOSPITAL_BASED_OUTPATIENT_CLINIC_OR_DEPARTMENT_OTHER): Payer: Medicare Other | Attending: Internal Medicine

## 2014-09-25 DIAGNOSIS — M14672 Charcot's joint, left ankle and foot: Secondary | ICD-10-CM | POA: Insufficient documentation

## 2014-09-25 DIAGNOSIS — G4730 Sleep apnea, unspecified: Secondary | ICD-10-CM | POA: Insufficient documentation

## 2014-09-25 DIAGNOSIS — I10 Essential (primary) hypertension: Secondary | ICD-10-CM | POA: Diagnosis not present

## 2014-09-25 DIAGNOSIS — E11621 Type 2 diabetes mellitus with foot ulcer: Secondary | ICD-10-CM | POA: Insufficient documentation

## 2014-09-25 DIAGNOSIS — D649 Anemia, unspecified: Secondary | ICD-10-CM | POA: Diagnosis not present

## 2014-09-25 DIAGNOSIS — L97522 Non-pressure chronic ulcer of other part of left foot with fat layer exposed: Secondary | ICD-10-CM | POA: Insufficient documentation

## 2014-09-25 DIAGNOSIS — M069 Rheumatoid arthritis, unspecified: Secondary | ICD-10-CM | POA: Diagnosis not present

## 2014-09-25 DIAGNOSIS — E1140 Type 2 diabetes mellitus with diabetic neuropathy, unspecified: Secondary | ICD-10-CM | POA: Diagnosis not present

## 2014-09-30 DIAGNOSIS — H4011X2 Primary open-angle glaucoma, moderate stage: Secondary | ICD-10-CM | POA: Diagnosis not present

## 2014-10-02 ENCOUNTER — Ambulatory Visit (HOSPITAL_COMMUNITY)
Admission: RE | Admit: 2014-10-02 | Discharge: 2014-10-02 | Disposition: A | Discharge status: Home / Self Care | Payer: Medicare Other | Source: Ambulatory Visit | Attending: Internal Medicine | Admitting: Internal Medicine

## 2014-10-02 ENCOUNTER — Ambulatory Visit (INDEPENDENT_AMBULATORY_CARE_PROVIDER_SITE_OTHER): Payer: Medicare Other | Admitting: Pulmonary Disease

## 2014-10-02 ENCOUNTER — Encounter: Payer: Self-pay | Admitting: Pulmonary Disease

## 2014-10-02 VITALS — BP 132/60 | HR 81 | Temp 98.1°F | Ht 70.0 in | Wt 244.6 lb

## 2014-10-02 DIAGNOSIS — I10 Essential (primary) hypertension: Secondary | ICD-10-CM | POA: Diagnosis not present

## 2014-10-02 DIAGNOSIS — G4733 Obstructive sleep apnea (adult) (pediatric): Secondary | ICD-10-CM | POA: Diagnosis not present

## 2014-10-02 DIAGNOSIS — E119 Type 2 diabetes mellitus without complications: Secondary | ICD-10-CM | POA: Diagnosis not present

## 2014-10-02 DIAGNOSIS — R9431 Abnormal electrocardiogram [ECG] [EKG]: Secondary | ICD-10-CM | POA: Diagnosis not present

## 2014-10-02 DIAGNOSIS — Z23 Encounter for immunization: Secondary | ICD-10-CM

## 2014-10-02 DIAGNOSIS — Z01818 Encounter for other preprocedural examination: Secondary | ICD-10-CM | POA: Insufficient documentation

## 2014-10-02 MED ORDER — METFORMIN HCL 1000 MG PO TABS: 500.0000 mg | ORAL_TABLET | Freq: Two times a day (BID) | ORAL | Status: DC

## 2014-10-02 MED ORDER — ATORVASTATIN CALCIUM 40 MG PO TABS
40.0000 mg | ORAL_TABLET | Freq: Every day | ORAL | Status: DC
Start: 2014-10-02 — End: 2014-10-02

## 2014-10-02 MED ORDER — ATORVASTATIN CALCIUM 40 MG PO TABS: 40.0000 mg | ORAL_TABLET | Freq: Every day | ORAL | Status: DC

## 2014-10-02 NOTE — Assessment & Plan Note (Addendum)
Planning to undergo left Achilles lengthening, talonavicular and calcaneus cultoid arthrodesis with correction osteotomy with [name]. Her estimated cardiac perioperative risk is is less than 1%. (0.53% Gupta Perioperative Cardiac Risk)  Plan: -Obtain baseline EKG since there is no baseline EKG in EMR. -She is low risk for this procedure and does not require additional cardiovascular testing. -She as instructed to hold aspirin 7 days before the surgery, hold furosemide (Lasix) on the morning of surgery, and resume when you are taking oral fluids, hold lisinopril/metformin/sitagliptin on the morning of surgery. Further instructions per surgeon/anesthesia.

## 2014-10-02 NOTE — Assessment & Plan Note (Signed)
Doing well on her CPAP. No daytime sleepiness or repeated awakenings.  Plan: -Instructed patient to bring CPAP if she is going to be staying overnight following her procedure.

## 2014-10-02 NOTE — Assessment & Plan Note (Signed)
Denies any episodes of hypoglycemia.  Plan: -Continue Januvia 100 mg daily and metformin 500 mg twice a day. -Will recheck her A1c in December

## 2014-10-02 NOTE — Assessment & Plan Note (Signed)
BP Readings from Last 3 Encounters:  10/02/14 132/60  09/03/14 109/75  08/15/14 123/84    Lab Results  Component Value Date   NA 137 09/11/2013   K 4.4 09/11/2013   CREATININE 1.35* 09/11/2013    Assessment: Blood pressure control: controlled at goal  Plan: Medications:  Continue current medications including Lasix 40 mg daily and lisinopril 20 mg daily.

## 2014-10-02 NOTE — Progress Notes (Signed)
Subjective:   Patient ID: [name], female    DOB: 14-Sep-1955, 59 y.o.   MRN: [id number]  HPI [name] is a 59 year old woman with history of hypertension, OSA on CPAP, GERD, diabetes type 2, CKD stage III presenting for follow-up.  She is planning to undergo left Achilles lengthening, talonavicular and calcaneus cultoid arthrodesis with correction osteotomy with [name].  Denies angina, dyspnea, syncope, and palpitations. Denies history of heart disease, cebrovascular or periperhal artery disease. Denies daytime sleepiness, snoring, and repeated awakenings.  Can walk up a flight of steps or a hill or walk on level ground (4 METs)  Review of Systems Constitutional: no fevers/chills Eyes: no vision changes Ears, nose, mouth, throat, and face: no cough Respiratory: no shortness of breath Cardiovascular: no chest pain Gastrointestinal: no nausea/vomiting, no abdominal pain, no constipation, no diarrhea Genitourinary: no dysuria, no hematuria Integument: no rash Hematologic/lymphatic: no bleeding/bruising, no edema Musculoskeletal: +chronic left foot pain Neurological: no paresthesias, no weakness  Past Medical History  Diagnosis Date  . Normocytic anemia   . Hypertension   . Foot ulcer, left   . Leg edema   . Glaucoma   . GERD (gastroesophageal reflux disease)   . High cholesterol   . Type II diabetes mellitus   . OSA on CPAP   . H/O hiatal hernia   . Arthritis     "right leg" (01/30/2013)  . CKD (chronic kidney disease), stage III     /notes 01/30/2013    Current Outpatient Prescriptions on File Prior to Visit  Medication Sig Dispense Refill  . acetaminophen (TYLENOL) 325 MG tablet Take 1 tablet (325 mg total) by mouth every 6 (six) hours as needed. For back pain. 30 tablet   . aspirin EC 81 MG tablet Take 81 mg by mouth daily.    . Blood Glucose Monitoring Suppl (ACCU-CHEK AVIVA PLUS) W/DEVICE KIT Use to Check Blood Sugars Daily. Dx Code: E11.9.  1 kit 0  . Calcium Carbonate-Vitamin D 600-400 MG-UNIT per chew tablet Chew 1 tablet by mouth 2 (two) times daily. 30 tablet 11  . chlorpheniramine-HYDROcodone (TUSSIONEX PENNKINETIC ER) 10-8 MG/5ML SUER Take 5 mLs by mouth every 12 (twelve) hours as needed for cough. 115 mL 0  . COMBIGAN 0.2-0.5 % ophthalmic solution Place 1 drop into both eyes 2 (two) times daily.    . ferrous sulfate 325 (65 FE) MG tablet Take 1 tablet (325 mg total) by mouth daily. With orange juice 30 tablet 0  . fluticasone (FLONASE) 50 MCG/ACT nasal spray Place 2 sprays into both nostrils daily. 9.9 g 0  . furosemide (LASIX) 40 MG tablet TAKE 1 TABLET (40 MG TOTAL) BY MOUTH DAILY. 30 tablet 2  . glucose blood (ACCU-CHEK AVIVA PLUS) test strip Use to check blood sugar 1 time daily. Dx code E11.9. Non insulin dependent 50 each 3  . glucose blood (ACCU-CHEK AVIVA PLUS) test strip Use to check blood sugar 1 time daily. diag code E11.9. Non insulin dependent 100 each 3  . lisinopril (PRINIVIL,ZESTRIL) 20 MG tablet Take 1 tablet (20 mg total) by mouth daily. 30 tablet 3  . LUMIGAN 0.01 % SOLN Place 1 drop into both eyes every evening.    . metFORMIN (GLUCOPHAGE) 1000 MG tablet Take 0.5 tablets (500 mg total) by mouth 2 (two) times daily with a meal. 180 tablet 3  . Multiple Vitamin (MULTIVITAMIN) tablet Take 1 tablet by mouth daily.     . pantoprazole (PROTONIX) 40 MG tablet  TAKE 1 TABLET BY MOUTH DAILY 90 tablet 4  . sitaGLIPtin (JANUVIA) 100 MG tablet Take 1 tablet (100 mg total) by mouth daily. 90 tablet 3  . [DISCONTINUED] pravastatin (PRAVACHOL) 20 MG tablet Take 1 tablet (20 mg total) by mouth daily. 30 tablet 6   No current facility-administered medications on file prior to visit.   Today's Vitals   10/02/14 1428  BP: 132/60  Pulse: 81  Temp: 98.1 F (36.7 C)  TempSrc: Oral  Weight: 244 lb 9.6 oz (110.95 kg)  SpO2: 100%   Objective:  Physical Exam  Constitutional: She is oriented to person, place, and time. She  appears well-developed and well-nourished. No distress.  HENT:  Head: Normocephalic and atraumatic.  Mouth/Throat: Oropharynx is clear and moist. No oropharyngeal exudate.  Eyes: EOM are normal. Pupils are equal, round, and reactive to light.  Neck: Neck supple. No thyromegaly present.  Cardiovascular: Normal rate and regular rhythm.   No murmur heard. Pulmonary/Chest: Breath sounds normal. She has no wheezes. She has no rales.  Abdominal: Soft. She exhibits no distension. There is no tenderness.  Musculoskeletal: Normal range of motion. She exhibits no edema.  Neurological: She is alert and oriented to person, place, and time. Coordination normal.  Skin: Skin is warm and dry.  Psychiatric: She has a normal mood and affect.   Assessment & Plan:  Please refer to problem based charting.

## 2014-10-02 NOTE — Patient Instructions (Addendum)
1. Hold aspirin 7 days before the surgery. 3. Hold furosemide (Lasix) on the morning of surgery, and resume when you are taking oral fluids. 4. Hold lisinopril on the morning of surgery. 5. Hold metformin on the morning of surgery. 6. Hold sitagliptin (Januvia) on the morning of surgery. 7. May continue Protonix (pantoprazole). 8. May continue atorvastatin.  If you will be staying overnight, please take your CPAP with you.

## 2014-10-06 DIAGNOSIS — L97522 Non-pressure chronic ulcer of other part of left foot with fat layer exposed: Secondary | ICD-10-CM | POA: Diagnosis not present

## 2014-10-06 DIAGNOSIS — E1140 Type 2 diabetes mellitus with diabetic neuropathy, unspecified: Secondary | ICD-10-CM | POA: Diagnosis not present

## 2014-10-06 DIAGNOSIS — I10 Essential (primary) hypertension: Secondary | ICD-10-CM | POA: Diagnosis not present

## 2014-10-06 DIAGNOSIS — M069 Rheumatoid arthritis, unspecified: Secondary | ICD-10-CM | POA: Diagnosis not present

## 2014-10-06 DIAGNOSIS — M14672 Charcot's joint, left ankle and foot: Secondary | ICD-10-CM | POA: Diagnosis not present

## 2014-10-06 DIAGNOSIS — E11621 Type 2 diabetes mellitus with foot ulcer: Secondary | ICD-10-CM | POA: Diagnosis not present

## 2014-10-06 NOTE — Progress Notes (Signed)
Internal Medicine Clinic Attending  Case discussed with Dr. Krall soon after the resident saw the patient.  We reviewed the resident's history and exam and pertinent patient test results.  I agree with the assessment, diagnosis, and plan of care documented in the resident's note. 

## 2014-10-13 ENCOUNTER — Other Ambulatory Visit: Payer: Self-pay | Admitting: Orthopedic Surgery

## 2014-10-13 DIAGNOSIS — I10 Essential (primary) hypertension: Secondary | ICD-10-CM | POA: Diagnosis not present

## 2014-10-13 DIAGNOSIS — L97522 Non-pressure chronic ulcer of other part of left foot with fat layer exposed: Secondary | ICD-10-CM | POA: Diagnosis not present

## 2014-10-13 DIAGNOSIS — M14672 Charcot's joint, left ankle and foot: Secondary | ICD-10-CM | POA: Diagnosis not present

## 2014-10-13 DIAGNOSIS — E1140 Type 2 diabetes mellitus with diabetic neuropathy, unspecified: Secondary | ICD-10-CM | POA: Diagnosis not present

## 2014-10-13 DIAGNOSIS — E11621 Type 2 diabetes mellitus with foot ulcer: Secondary | ICD-10-CM | POA: Diagnosis not present

## 2014-10-13 DIAGNOSIS — M069 Rheumatoid arthritis, unspecified: Secondary | ICD-10-CM | POA: Diagnosis not present

## 2014-10-19 ENCOUNTER — Other Ambulatory Visit: Payer: Self-pay | Admitting: Pulmonary Disease

## 2014-10-25 ENCOUNTER — Other Ambulatory Visit: Payer: Self-pay | Admitting: Pulmonary Disease

## 2014-10-27 ENCOUNTER — Encounter (HOSPITAL_BASED_OUTPATIENT_CLINIC_OR_DEPARTMENT_OTHER): Payer: Medicare Other | Attending: Plastic Surgery

## 2014-10-27 DIAGNOSIS — I10 Essential (primary) hypertension: Secondary | ICD-10-CM | POA: Diagnosis not present

## 2014-10-27 DIAGNOSIS — E1140 Type 2 diabetes mellitus with diabetic neuropathy, unspecified: Secondary | ICD-10-CM | POA: Insufficient documentation

## 2014-10-27 DIAGNOSIS — L97522 Non-pressure chronic ulcer of other part of left foot with fat layer exposed: Secondary | ICD-10-CM | POA: Insufficient documentation

## 2014-10-27 DIAGNOSIS — E11621 Type 2 diabetes mellitus with foot ulcer: Secondary | ICD-10-CM | POA: Insufficient documentation

## 2014-10-27 DIAGNOSIS — H409 Unspecified glaucoma: Secondary | ICD-10-CM | POA: Diagnosis not present

## 2014-10-27 DIAGNOSIS — M069 Rheumatoid arthritis, unspecified: Secondary | ICD-10-CM | POA: Insufficient documentation

## 2014-10-27 DIAGNOSIS — G4730 Sleep apnea, unspecified: Secondary | ICD-10-CM | POA: Insufficient documentation

## 2014-10-27 DIAGNOSIS — E11610 Type 2 diabetes mellitus with diabetic neuropathic arthropathy: Secondary | ICD-10-CM | POA: Diagnosis not present

## 2014-10-29 ENCOUNTER — Encounter (HOSPITAL_COMMUNITY)
Admission: RE | Admit: 2014-10-29 | Discharge: 2014-10-29 | Disposition: A | Discharge status: Home / Self Care | Payer: Medicare Other | Source: Ambulatory Visit | Attending: Orthopedic Surgery | Admitting: Orthopedic Surgery

## 2014-10-29 ENCOUNTER — Encounter (HOSPITAL_COMMUNITY): Payer: Self-pay

## 2014-10-29 DIAGNOSIS — E785 Hyperlipidemia, unspecified: Secondary | ICD-10-CM | POA: Insufficient documentation

## 2014-10-29 DIAGNOSIS — Z01818 Encounter for other preprocedural examination: Secondary | ICD-10-CM | POA: Diagnosis not present

## 2014-10-29 DIAGNOSIS — E1122 Type 2 diabetes mellitus with diabetic chronic kidney disease: Secondary | ICD-10-CM | POA: Insufficient documentation

## 2014-10-29 DIAGNOSIS — E11621 Type 2 diabetes mellitus with foot ulcer: Secondary | ICD-10-CM | POA: Insufficient documentation

## 2014-10-29 DIAGNOSIS — L97509 Non-pressure chronic ulcer of other part of unspecified foot with unspecified severity: Secondary | ICD-10-CM | POA: Diagnosis not present

## 2014-10-29 DIAGNOSIS — Z01812 Encounter for preprocedural laboratory examination: Secondary | ICD-10-CM | POA: Insufficient documentation

## 2014-10-29 DIAGNOSIS — E11610 Type 2 diabetes mellitus with diabetic neuropathic arthropathy: Secondary | ICD-10-CM | POA: Diagnosis not present

## 2014-10-29 DIAGNOSIS — Z87891 Personal history of nicotine dependence: Secondary | ICD-10-CM | POA: Insufficient documentation

## 2014-10-29 DIAGNOSIS — G4733 Obstructive sleep apnea (adult) (pediatric): Secondary | ICD-10-CM | POA: Insufficient documentation

## 2014-10-29 DIAGNOSIS — Z7982 Long term (current) use of aspirin: Secondary | ICD-10-CM | POA: Diagnosis not present

## 2014-10-29 DIAGNOSIS — I129 Hypertensive chronic kidney disease with stage 1 through stage 4 chronic kidney disease, or unspecified chronic kidney disease: Secondary | ICD-10-CM | POA: Diagnosis not present

## 2014-10-29 DIAGNOSIS — H409 Unspecified glaucoma: Secondary | ICD-10-CM | POA: Diagnosis not present

## 2014-10-29 DIAGNOSIS — N183 Chronic kidney disease, stage 3 (moderate): Secondary | ICD-10-CM | POA: Diagnosis not present

## 2014-10-29 DIAGNOSIS — Z79899 Other long term (current) drug therapy: Secondary | ICD-10-CM | POA: Insufficient documentation

## 2014-10-29 LAB — CBC
HCT: 31.5 % — ABNORMAL LOW (ref 36.0–46.0)
Hemoglobin: 10.1 g/dL — ABNORMAL LOW (ref 12.0–15.0)
MCH: 27.3 pg (ref 26.0–34.0)
MCHC: 32.1 g/dL (ref 30.0–36.0)
MCV: 85.1 fL (ref 78.0–100.0)
PLATELETS: 241 10*3/uL (ref 150–400)
RBC: 3.7 MIL/uL — ABNORMAL LOW (ref 3.87–5.11)
RDW: 15.9 % — AB (ref 11.5–15.5)
WBC: 7.5 10*3/uL (ref 4.0–10.5)

## 2014-10-29 LAB — BASIC METABOLIC PANEL
Anion gap: 7 (ref 5–15)
BUN: 28 mg/dL — AB (ref 6–20)
CALCIUM: 9.4 mg/dL (ref 8.9–10.3)
CO2: 26 mmol/L (ref 22–32)
Chloride: 105 mmol/L (ref 101–111)
Creatinine, Ser: 1.67 mg/dL — ABNORMAL HIGH (ref 0.44–1.00)
GFR calc Af Amer: 38 mL/min — ABNORMAL LOW (ref >60)
GFR, EST NON AFRICAN AMERICAN: 33 mL/min — AB (ref >60)
GLUCOSE: 96 mg/dL (ref 65–99)
Potassium: 5 mmol/L (ref 3.5–5.1)
SODIUM: 138 mmol/L (ref 135–145)

## 2014-10-29 LAB — GLUCOSE, CAPILLARY: GLUCOSE-CAPILLARY: 118 mg/dL — AB (ref 65–99)

## 2014-10-29 NOTE — Pre-Procedure Instructions (Signed)
[name]  10/29/2014      [facility] [address] - [name], [place] - [address] [place] [address] Phone: [phone] Fax: [phone]  [facility] - [place], [place] - [address] [place] [address] Phone: [phone] Fax: [phone]  [facility] [facility] [facility] [place] - [address] [place] [address] Phone: [phone] Fax: [phone]    Your procedure is scheduled on 11-06-2014  Thursday    Report to [facility] Admitting at 8:30  A.M.   Call this number if you have problems the morning of surgery:  [phone]   Remember:  Do not eat food or drink liquids after midnight.   Take these medicines the morning of surgery with A SIP OF WATER    Do not wear jewelry, make-up or nail polish.  Do not wear lotions, powders, or perfumes.  You may not wear deodorant.  Do not shave 48 hours prior to surgery.  .  Do not bring valuables to the hospital.  [facility] is not responsible for any belongings or valuables.  Contacts, dentures or bridgework may not be worn into surgery.  Leave your suitcase in the car.  After surgery it may be brought to your room.  For patients admitted to the hospital, discharge time will be determined by your treatment team.  Patients discharged the day of surgery will not be allowed to drive home.    Special instructions:  See Attached Sheet  For instructions on CHG shower  Please read over the following fact sheets that you were given. Pain Booklet, Coughing and Deep Breathing and Surgical Site Infection Prevention                                                   How to Manage Your Diabetes Before Surgery   Why is it important to control my blood sugar before and after surgery?   Improving blood sugar levels before and after surgery helps healing and can limit problems.  A way of  improving blood sugar control is eating a healthy diet by:  - Eating less sugar and carbohydrates  - Increasing activity/exercise  - Talk with your doctor about reaching your blood sugar goals  High blood sugars (greater than 180 mg/dL) can raise your risk of infections and slow down your recovery so you will need to focus on controlling your diabetes during the weeks before surgery.  Make sure that the doctor who takes care of your diabetes knows about your planned surgery including the date and location.  How do I manage my blood sugars before surgery?   Check your blood sugar at least 4 times a day, 2 days before surgery to make sure that they are not too high or low.   Check your blood sugar the morning of your surgery when you wake up and every 2   hours until you get to the Short-Stay unit.  If your blood sugar is less than 70 mg/dL, you will need to treat for low blood sugar by:  Treat a low blood sugar (less than 70 mg/dL) with 1/2 cup of clear juice (cranberry or apple), 4 glucose tablets, OR glucose gel.  Recheck blood sugar in 15  minutes after treatment (to make sure it is greater than 70 mg/dL).  If blood sugar is not greater than 70 mg/dL on re-check, call [phone] for further instructions.   Report your blood sugar to the Short-Stay nurse when you get to Short-Stay.  References:  [facility], 2007 "How to Manage your Diabetes Before and After Surgery".  What do I do about my diabetes medications?   Do not take oral diabetes medicines (pills) the morning of surgery.            .   :

## 2014-10-29 NOTE — Pre-Procedure Instructions (Addendum)
    LINSAY VOGT  10/29/2014      [facility] [address] - [name], [place] - [address] [place] [address] Phone: [phone] Fax: [phone]  [facility] - [place], [place] - [address] [place] [address] Phone: [phone] Fax: [phone]  [facility] [facility] [place] - [address] [place] [address] Phone: [phone] Fax: [phone]    Your procedure is scheduled on 11-06-2014  Thursday   Report to [facility] Admitting at 8:30 A.M.   Call this number if you have problems the morning of surgery:  [phone]   Remember:  Do not eat food or drink liquids after midnight .  Take these medicines the morning of surgery with A SIP OF WATER Tylenol if needed,Combigan eye drops,protonix              STOP ALL OVER THE COUNTER SUPPLEMENTS 5 DAYS BEFORE SURGERY   Do not wear jewelry, make-up or nail polish.  Do not wear lotions, powders, or perfumes.  You may not wear deodorant.  Do not shave 48 hours prior to surgery.  .  Do not bring valuables to the hospital.  [facility] is not responsible for any belongings or valuables.  Contacts, dentures or bridgework may not be worn into surgery.  Leave your suitcase in the car.  After surgery it may be brought to your room.  For patients admitted to the hospital, discharge time will be determined by your treatment team.  Patients discharged the day of surgery will not be allowed to drive home.    Special instructions:  See attached Sheet for instructions on CHG shower  Please read over the following fact sheets that you were given. Pain Booklet, Coughing and Deep Breathing and Surgical Site Infection Prevention

## 2014-10-30 NOTE — Progress Notes (Signed)
Anesthesia Chart Review:  Pt is 59 year old female scheduled for L achilles tendon lengthening, L talonavicular cancellous caltoid arthrodesis with correction of the osteotomy on 11/06/2014 with [name].   PCP is Dr. Griffin Basil at [facility].   PMH includes: HTN, DM, CKD (stage III), OSA (on CPAP), hyperlipidemia, glaucoma, anemia. Former smoker. BMI 35.5.   Medications include: ASA, lipitor, iron, lasix, lisinopril, metformin, protonix, sitagliptin.  Preoperative labs reviewed.  Cr 1.67, BUN 28; pt has stage III CKD. Glucose 96. H/H 10.1/31.5.  EKG 10/02/2014: NSR. LAD. Septal infarct, age undetermined. LAE.   Pt has medical clearance from Dr. Isabella Bowens in Epic note dated 10/02/14.   If no changes, I anticipate pt can proceed with surgery as scheduled.   Rica Mast, FNP-BC [facility]/Anesthesiology Phone: [phone] 10/30/2014 3:03 PM

## 2014-10-31 LAB — HEMOGLOBIN A1C
Hgb A1c MFr Bld: 7.2 % — ABNORMAL HIGH (ref 4.8–5.6)
MEAN PLASMA GLUCOSE: 160 mg/dL

## 2014-11-03 DIAGNOSIS — G4730 Sleep apnea, unspecified: Secondary | ICD-10-CM | POA: Diagnosis not present

## 2014-11-03 DIAGNOSIS — E11610 Type 2 diabetes mellitus with diabetic neuropathic arthropathy: Secondary | ICD-10-CM | POA: Diagnosis not present

## 2014-11-03 DIAGNOSIS — L97522 Non-pressure chronic ulcer of other part of left foot with fat layer exposed: Secondary | ICD-10-CM | POA: Diagnosis not present

## 2014-11-03 DIAGNOSIS — E11621 Type 2 diabetes mellitus with foot ulcer: Secondary | ICD-10-CM | POA: Diagnosis not present

## 2014-11-06 ENCOUNTER — Encounter (HOSPITAL_COMMUNITY)
Admission: AD | Disposition: A | Discharge status: Skilled Nursing Facility | Payer: Medicare Other | Source: Ambulatory Visit | Attending: Orthopedic Surgery

## 2014-11-06 ENCOUNTER — Encounter (HOSPITAL_COMMUNITY): Payer: Self-pay | Admitting: *Deleted

## 2014-11-06 ENCOUNTER — Inpatient Hospital Stay (HOSPITAL_COMMUNITY): Payer: Medicare Other | Admitting: Certified Registered Nurse Anesthetist

## 2014-11-06 ENCOUNTER — Inpatient Hospital Stay (HOSPITAL_COMMUNITY)
Admission: AD | Admit: 2014-11-06 | Discharge: 2014-11-10 | DRG: 494 | Disposition: A | Discharge status: Skilled Nursing Facility | Payer: Medicare Other | Source: Ambulatory Visit | Attending: Orthopedic Surgery | Admitting: Orthopedic Surgery

## 2014-11-06 ENCOUNTER — Inpatient Hospital Stay (HOSPITAL_COMMUNITY): Payer: Medicare Other

## 2014-11-06 ENCOUNTER — Inpatient Hospital Stay (HOSPITAL_COMMUNITY): Payer: Medicare Other | Admitting: Emergency Medicine

## 2014-11-06 DIAGNOSIS — Z888 Allergy status to other drugs, medicaments and biological substances status: Secondary | ICD-10-CM

## 2014-11-06 DIAGNOSIS — M1460 Charcot's joint, unspecified site: Secondary | ICD-10-CM | POA: Diagnosis not present

## 2014-11-06 DIAGNOSIS — E7800 Pure hypercholesterolemia, unspecified: Secondary | ICD-10-CM | POA: Diagnosis present

## 2014-11-06 DIAGNOSIS — Z8249 Family history of ischemic heart disease and other diseases of the circulatory system: Secondary | ICD-10-CM | POA: Diagnosis not present

## 2014-11-06 DIAGNOSIS — M79672 Pain in left foot: Secondary | ICD-10-CM | POA: Diagnosis not present

## 2014-11-06 DIAGNOSIS — E785 Hyperlipidemia, unspecified: Secondary | ICD-10-CM | POA: Diagnosis not present

## 2014-11-06 DIAGNOSIS — H409 Unspecified glaucoma: Secondary | ICD-10-CM | POA: Diagnosis present

## 2014-11-06 DIAGNOSIS — M7660 Achilles tendinitis, unspecified leg: Secondary | ICD-10-CM | POA: Diagnosis not present

## 2014-11-06 DIAGNOSIS — Z87891 Personal history of nicotine dependence: Secondary | ICD-10-CM | POA: Diagnosis not present

## 2014-11-06 DIAGNOSIS — G4733 Obstructive sleep apnea (adult) (pediatric): Secondary | ICD-10-CM | POA: Diagnosis present

## 2014-11-06 DIAGNOSIS — M6281 Muscle weakness (generalized): Secondary | ICD-10-CM | POA: Diagnosis not present

## 2014-11-06 DIAGNOSIS — K219 Gastro-esophageal reflux disease without esophagitis: Secondary | ICD-10-CM | POA: Diagnosis present

## 2014-11-06 DIAGNOSIS — Z79899 Other long term (current) drug therapy: Secondary | ICD-10-CM | POA: Diagnosis not present

## 2014-11-06 DIAGNOSIS — M14672 Charcot's joint, left ankle and foot: Secondary | ICD-10-CM | POA: Diagnosis not present

## 2014-11-06 DIAGNOSIS — E13621 Other specified diabetes mellitus with foot ulcer: Secondary | ICD-10-CM | POA: Diagnosis not present

## 2014-11-06 DIAGNOSIS — Z981 Arthrodesis status: Secondary | ICD-10-CM | POA: Diagnosis not present

## 2014-11-06 DIAGNOSIS — E11621 Type 2 diabetes mellitus with foot ulcer: Secondary | ICD-10-CM | POA: Diagnosis present

## 2014-11-06 DIAGNOSIS — I10 Essential (primary) hypertension: Secondary | ICD-10-CM | POA: Diagnosis not present

## 2014-11-06 DIAGNOSIS — L97523 Non-pressure chronic ulcer of other part of left foot with necrosis of muscle: Secondary | ICD-10-CM | POA: Diagnosis not present

## 2014-11-06 DIAGNOSIS — Z419 Encounter for procedure for purposes other than remedying health state, unspecified: Secondary | ICD-10-CM

## 2014-11-06 DIAGNOSIS — Z9889 Other specified postprocedural states: Secondary | ICD-10-CM | POA: Diagnosis not present

## 2014-11-06 DIAGNOSIS — M216X2 Other acquired deformities of left foot: Secondary | ICD-10-CM | POA: Diagnosis present

## 2014-11-06 DIAGNOSIS — Z794 Long term (current) use of insulin: Secondary | ICD-10-CM | POA: Diagnosis not present

## 2014-11-06 DIAGNOSIS — Z7982 Long term (current) use of aspirin: Secondary | ICD-10-CM

## 2014-11-06 DIAGNOSIS — Z833 Family history of diabetes mellitus: Secondary | ICD-10-CM | POA: Diagnosis not present

## 2014-11-06 DIAGNOSIS — Z7984 Long term (current) use of oral hypoglycemic drugs: Secondary | ICD-10-CM | POA: Diagnosis not present

## 2014-11-06 DIAGNOSIS — N183 Chronic kidney disease, stage 3 (moderate): Secondary | ICD-10-CM | POA: Diagnosis present

## 2014-11-06 DIAGNOSIS — E1122 Type 2 diabetes mellitus with diabetic chronic kidney disease: Secondary | ICD-10-CM | POA: Diagnosis present

## 2014-11-06 DIAGNOSIS — E1142 Type 2 diabetes mellitus with diabetic polyneuropathy: Secondary | ICD-10-CM | POA: Diagnosis present

## 2014-11-06 DIAGNOSIS — E13610 Other specified diabetes mellitus with diabetic neuropathic arthropathy: Secondary | ICD-10-CM | POA: Diagnosis not present

## 2014-11-06 DIAGNOSIS — R278 Other lack of coordination: Secondary | ICD-10-CM | POA: Diagnosis not present

## 2014-11-06 DIAGNOSIS — E11610 Type 2 diabetes mellitus with diabetic neuropathic arthropathy: Secondary | ICD-10-CM | POA: Diagnosis present

## 2014-11-06 DIAGNOSIS — E119 Type 2 diabetes mellitus without complications: Secondary | ICD-10-CM | POA: Diagnosis not present

## 2014-11-06 DIAGNOSIS — D62 Acute posthemorrhagic anemia: Secondary | ICD-10-CM | POA: Diagnosis not present

## 2014-11-06 DIAGNOSIS — G8918 Other acute postprocedural pain: Secondary | ICD-10-CM | POA: Diagnosis not present

## 2014-11-06 DIAGNOSIS — Z885 Allergy status to narcotic agent status: Secondary | ICD-10-CM | POA: Diagnosis not present

## 2014-11-06 DIAGNOSIS — R2681 Unsteadiness on feet: Secondary | ICD-10-CM | POA: Diagnosis not present

## 2014-11-06 DIAGNOSIS — I129 Hypertensive chronic kidney disease with stage 1 through stage 4 chronic kidney disease, or unspecified chronic kidney disease: Secondary | ICD-10-CM | POA: Diagnosis present

## 2014-11-06 DIAGNOSIS — K5900 Constipation, unspecified: Secondary | ICD-10-CM | POA: Diagnosis not present

## 2014-11-06 HISTORY — PX: ACHILLES TENDON LENGTHENING: SHX6455

## 2014-11-06 HISTORY — PX: FOOT ARTHRODESIS: SHX1655

## 2014-11-06 LAB — GLUCOSE, CAPILLARY
GLUCOSE-CAPILLARY: 119 mg/dL — AB (ref 65–99)
Glucose-Capillary: 132 mg/dL — ABNORMAL HIGH (ref 65–99)
Glucose-Capillary: 101 mg/dL — ABNORMAL HIGH (ref 65–99)

## 2014-11-06 SURGERY — LENGTHENING, TENDON, ACHILLES
Anesthesia: Regional | Site: Foot | Laterality: Left

## 2014-11-06 MED ORDER — EPHEDRINE SULFATE 50 MG/ML IJ SOLN
INTRAMUSCULAR | Status: AC
  Filled 2014-11-06: qty 1

## 2014-11-06 MED ORDER — OXYCODONE HCL 5 MG/5ML PO SOLN: 5.0000 mg | Freq: Once | ORAL | Status: DC | PRN

## 2014-11-06 MED ORDER — PROPOFOL 10 MG/ML IV BOLUS
INTRAVENOUS | Status: AC
  Filled 2014-11-06: qty 20

## 2014-11-06 MED ORDER — FENTANYL CITRATE (PF) 100 MCG/2ML IJ SOLN
100.0000 ug | Freq: Once | INTRAMUSCULAR | Status: AC
  Administered 2014-11-06: 100 ug via INTRAVENOUS

## 2014-11-06 MED ORDER — ACETAMINOPHEN 325 MG PO TABS: 650.0000 mg | ORAL_TABLET | Freq: Four times a day (QID) | ORAL | Status: DC | PRN

## 2014-11-06 MED ORDER — ONDANSETRON HCL 4 MG/2ML IJ SOLN
INTRAMUSCULAR | Status: AC
  Filled 2014-11-06: qty 2

## 2014-11-06 MED ORDER — LINAGLIPTIN 5 MG PO TABS
5.0000 mg | ORAL_TABLET | Freq: Every day | ORAL | Status: DC
  Administered 2014-11-06 – 2014-11-10 (×5): 5 mg via ORAL
  Filled 2014-11-06 (×5): qty 1

## 2014-11-06 MED ORDER — TIMOLOL MALEATE 0.5 % OP SOLN
1.0000 [drp] | Freq: Two times a day (BID) | OPHTHALMIC | Status: DC
  Administered 2014-11-06 – 2014-11-10 (×8): 1 [drp] via OPHTHALMIC
  Filled 2014-11-06: qty 5

## 2014-11-06 MED ORDER — ADULT MULTIVITAMIN W/MINERALS CH
1.0000 | ORAL_TABLET | Freq: Every day | ORAL | Status: DC
  Administered 2014-11-06 – 2014-11-10 (×5): 1 via ORAL
  Filled 2014-11-06 (×5): qty 1

## 2014-11-06 MED ORDER — ENOXAPARIN SODIUM 40 MG/0.4ML ~~LOC~~ SOLN
40.0000 mg | SUBCUTANEOUS | Status: DC
  Administered 2014-11-07 – 2014-11-10 (×4): 40 mg via SUBCUTANEOUS
  Filled 2014-11-06 (×5): qty 0.4

## 2014-11-06 MED ORDER — METHOCARBAMOL 500 MG PO TABS
500.0000 mg | ORAL_TABLET | Freq: Four times a day (QID) | ORAL | Status: DC | PRN
  Administered 2014-11-07 – 2014-11-10 (×8): 500 mg via ORAL
  Filled 2014-11-06 (×8): qty 1

## 2014-11-06 MED ORDER — ATORVASTATIN CALCIUM 40 MG PO TABS
40.0000 mg | ORAL_TABLET | Freq: Every day | ORAL | Status: DC
  Administered 2014-11-06 – 2014-11-10 (×5): 40 mg via ORAL
  Filled 2014-11-06 (×5): qty 1

## 2014-11-06 MED ORDER — GLUCOSE BLOOD VI STRP: 1.0000 | ORAL_STRIP | Freq: Every morning | Status: DC

## 2014-11-06 MED ORDER — SODIUM CHLORIDE 0.9 % IV SOLN
INTRAVENOUS | Status: DC
  Administered 2014-11-06 (×2): via INTRAVENOUS

## 2014-11-06 MED ORDER — ONDANSETRON HCL 4 MG/2ML IJ SOLN
INTRAMUSCULAR | Status: DC | PRN
  Administered 2014-11-06: 4 mg via INTRAVENOUS

## 2014-11-06 MED ORDER — OXYCODONE HCL 5 MG PO TABS
5.0000 mg | ORAL_TABLET | Freq: Once | ORAL | Status: DC | PRN
Start: 2014-11-06 — End: 2014-11-06

## 2014-11-06 MED ORDER — CALCIUM CARBONATE-VITAMIN D 500-200 MG-UNIT PO TABS
1.0000 | ORAL_TABLET | Freq: Two times a day (BID) | ORAL | Status: DC
  Administered 2014-11-06 – 2014-11-10 (×8): 1 via ORAL
  Filled 2014-11-06 (×8): qty 1

## 2014-11-06 MED ORDER — BIOTIN 1000 MCG PO TABS: 1000.0000 ug | ORAL_TABLET | Freq: Every day | ORAL | Status: DC

## 2014-11-06 MED ORDER — MIDAZOLAM HCL 2 MG/2ML IJ SOLN
INTRAMUSCULAR | Status: AC
  Filled 2014-11-06: qty 2

## 2014-11-06 MED ORDER — LISINOPRIL 20 MG PO TABS
20.0000 mg | ORAL_TABLET | Freq: Every day | ORAL | Status: DC
  Administered 2014-11-06 – 2014-11-10 (×2): 20 mg via ORAL
  Filled 2014-11-06 (×4): qty 1

## 2014-11-06 MED ORDER — SENNA 8.6 MG PO TABS
1.0000 | ORAL_TABLET | Freq: Two times a day (BID) | ORAL | Status: DC
  Administered 2014-11-06 – 2014-11-10 (×8): 8.6 mg via ORAL
  Filled 2014-11-06 (×8): qty 1

## 2014-11-06 MED ORDER — VANCOMYCIN HCL POWD
Status: DC | PRN
  Administered 2014-11-06: 500 mg via TOPICAL

## 2014-11-06 MED ORDER — 0.9 % SODIUM CHLORIDE (POUR BTL) OPTIME
TOPICAL | Status: DC | PRN
  Administered 2014-11-06: 1000 mL

## 2014-11-06 MED ORDER — DOCUSATE SODIUM 100 MG PO CAPS
100.0000 mg | ORAL_CAPSULE | Freq: Two times a day (BID) | ORAL | Status: DC
  Administered 2014-11-06 – 2014-11-10 (×8): 100 mg via ORAL
  Filled 2014-11-06 (×8): qty 1

## 2014-11-06 MED ORDER — PHENYLEPHRINE 40 MCG/ML (10ML) SYRINGE FOR IV PUSH (FOR BLOOD PRESSURE SUPPORT)
PREFILLED_SYRINGE | INTRAVENOUS | Status: AC
  Filled 2014-11-06: qty 10

## 2014-11-06 MED ORDER — FENTANYL CITRATE (PF) 250 MCG/5ML IJ SOLN
INTRAMUSCULAR | Status: AC
  Filled 2014-11-06: qty 5

## 2014-11-06 MED ORDER — ZINC 50 MG PO CAPS: 1.0000 | ORAL_CAPSULE | Freq: Every day | ORAL | Status: DC

## 2014-11-06 MED ORDER — LACTATED RINGERS IV SOLN
INTRAVENOUS | Status: DC | PRN
  Administered 2014-11-06 (×2): via INTRAVENOUS

## 2014-11-06 MED ORDER — NEOSTIGMINE METHYLSULFATE 10 MG/10ML IV SOLN
INTRAVENOUS | Status: DC | PRN
  Administered 2014-11-06: 4 mg via INTRAVENOUS

## 2014-11-06 MED ORDER — DIPHENHYDRAMINE HCL 12.5 MG/5ML PO ELIX: 12.5000 mg | ORAL_SOLUTION | ORAL | Status: DC | PRN

## 2014-11-06 MED ORDER — GLYCOPYRROLATE 0.2 MG/ML IJ SOLN
INTRAMUSCULAR | Status: DC | PRN
  Administered 2014-11-06: 0.2 mg via INTRAVENOUS
  Administered 2014-11-06: 0.6 mg via INTRAVENOUS
  Administered 2014-11-06: 0.4 mg via INTRAVENOUS

## 2014-11-06 MED ORDER — SODIUM CHLORIDE 0.9 % IV SOLN
INTRAVENOUS | Status: DC
  Administered 2014-11-06 – 2014-11-07 (×2): via INTRAVENOUS

## 2014-11-06 MED ORDER — MIDAZOLAM HCL 2 MG/2ML IJ SOLN: 2.0000 mg | Freq: Once | INTRAMUSCULAR | Status: AC

## 2014-11-06 MED ORDER — LIDOCAINE HCL (CARDIAC) 20 MG/ML IV SOLN
INTRAVENOUS | Status: DC | PRN
  Administered 2014-11-06: 80 mg via INTRAVENOUS

## 2014-11-06 MED ORDER — PROPOFOL 10 MG/ML IV BOLUS
INTRAVENOUS | Status: DC | PRN
  Administered 2014-11-06: 120 mg via INTRAVENOUS

## 2014-11-06 MED ORDER — CEFAZOLIN SODIUM-DEXTROSE 2-3 GM-% IV SOLR
INTRAVENOUS | Status: AC
  Administered 2014-11-06: 2 g via INTRAVENOUS
  Filled 2014-11-06: qty 50

## 2014-11-06 MED ORDER — METHOCARBAMOL 1000 MG/10ML IJ SOLN
500.0000 mg | Freq: Four times a day (QID) | INTRAVENOUS | Status: DC | PRN
  Filled 2014-11-06: qty 5

## 2014-11-06 MED ORDER — HYDROMORPHONE HCL 1 MG/ML IJ SOLN: 0.2500 mg | INTRAMUSCULAR | Status: DC | PRN

## 2014-11-06 MED ORDER — FENTANYL CITRATE (PF) 100 MCG/2ML IJ SOLN
INTRAMUSCULAR | Status: AC
  Administered 2014-11-06: 100 ug via INTRAVENOUS
  Filled 2014-11-06: qty 2

## 2014-11-06 MED ORDER — BRIMONIDINE TARTRATE-TIMOLOL 0.2-0.5 % OP SOLN: 1.0000 [drp] | Freq: Two times a day (BID) | OPHTHALMIC | Status: DC

## 2014-11-06 MED ORDER — ACETAMINOPHEN 160 MG/5ML PO SOLN
325.0000 mg | ORAL | Status: DC | PRN
Start: 2014-11-06 — End: 2014-11-06

## 2014-11-06 MED ORDER — MIDAZOLAM HCL 2 MG/2ML IJ SOLN
INTRAMUSCULAR | Status: AC
  Administered 2014-11-06: 2 mg
  Filled 2014-11-06: qty 2

## 2014-11-06 MED ORDER — VECURONIUM BROMIDE 10 MG IV SOLR
INTRAVENOUS | Status: DC | PRN
  Administered 2014-11-06 (×3): 2 mg via INTRAVENOUS
  Administered 2014-11-06: 1 mg via INTRAVENOUS

## 2014-11-06 MED ORDER — DIPHENHYDRAMINE HCL 50 MG/ML IJ SOLN
INTRAMUSCULAR | Status: AC
  Filled 2014-11-06: qty 1

## 2014-11-06 MED ORDER — CEFAZOLIN SODIUM-DEXTROSE 2-3 GM-% IV SOLR: 2.0000 g | INTRAVENOUS | Status: DC

## 2014-11-06 MED ORDER — ONDANSETRON HCL 4 MG PO TABS
4.0000 mg | ORAL_TABLET | Freq: Four times a day (QID) | ORAL | Status: DC | PRN
  Administered 2014-11-08 – 2014-11-10 (×2): 4 mg via ORAL
  Filled 2014-11-06 (×2): qty 1

## 2014-11-06 MED ORDER — METOCLOPRAMIDE HCL 5 MG PO TABS: 5.0000 mg | ORAL_TABLET | Freq: Three times a day (TID) | ORAL | Status: DC | PRN

## 2014-11-06 MED ORDER — ROCURONIUM BROMIDE 50 MG/5ML IV SOLN
INTRAVENOUS | Status: AC
  Filled 2014-11-06: qty 1

## 2014-11-06 MED ORDER — CHLORHEXIDINE GLUCONATE 4 % EX LIQD: 60.0000 mL | Freq: Once | CUTANEOUS | Status: DC

## 2014-11-06 MED ORDER — INSULIN ASPART 100 UNIT/ML ~~LOC~~ SOLN
0.0000 [IU] | Freq: Three times a day (TID) | SUBCUTANEOUS | Status: DC
  Administered 2014-11-06 – 2014-11-08 (×3): 2 [IU] via SUBCUTANEOUS

## 2014-11-06 MED ORDER — ACETAMINOPHEN 325 MG PO TABS
325.0000 mg | ORAL_TABLET | ORAL | Status: DC | PRN
Start: 2014-11-06 — End: 2014-11-06

## 2014-11-06 MED ORDER — FENTANYL CITRATE (PF) 100 MCG/2ML IJ SOLN
INTRAMUSCULAR | Status: DC | PRN
  Administered 2014-11-06 (×3): 50 ug via INTRAVENOUS

## 2014-11-06 MED ORDER — VITAMIN C 500 MG PO TABS
500.0000 mg | ORAL_TABLET | Freq: Every day | ORAL | Status: DC
Start: 2014-11-06 — End: 2014-11-10
  Administered 2014-11-06 – 2014-11-10 (×5): 500 mg via ORAL
  Filled 2014-11-06 (×5): qty 1

## 2014-11-06 MED ORDER — CALCIUM CARBONATE-VITAMIN D 600-400 MG-UNIT PO CHEW: 1.0000 | CHEWABLE_TABLET | Freq: Two times a day (BID) | ORAL | Status: DC

## 2014-11-06 MED ORDER — SUCCINYLCHOLINE CHLORIDE 20 MG/ML IJ SOLN
INTRAMUSCULAR | Status: AC
  Filled 2014-11-06: qty 1

## 2014-11-06 MED ORDER — SUCCINYLCHOLINE CHLORIDE 20 MG/ML IJ SOLN
INTRAMUSCULAR | Status: DC | PRN
  Administered 2014-11-06: 60 mg via INTRAVENOUS

## 2014-11-06 MED ORDER — FUROSEMIDE 40 MG PO TABS
40.0000 mg | ORAL_TABLET | Freq: Every day | ORAL | Status: DC
  Administered 2014-11-06 – 2014-11-10 (×5): 40 mg via ORAL
  Filled 2014-11-06 (×5): qty 1

## 2014-11-06 MED ORDER — BUPIVACAINE-EPINEPHRINE (PF) 0.5% -1:200000 IJ SOLN
INTRAMUSCULAR | Status: DC | PRN
  Administered 2014-11-06: 30 mL via PERINEURAL

## 2014-11-06 MED ORDER — GLYCOPYRROLATE 0.2 MG/ML IJ SOLN
INTRAMUSCULAR | Status: AC
  Filled 2014-11-06: qty 4

## 2014-11-06 MED ORDER — HYDROMORPHONE HCL 2 MG PO TABS
2.0000 mg | ORAL_TABLET | ORAL | Status: DC | PRN
  Administered 2014-11-06: 2 mg via ORAL
  Administered 2014-11-07 (×2): 4 mg via ORAL
  Administered 2014-11-07: 2 mg via ORAL
  Filled 2014-11-06 (×2): qty 2
  Filled 2014-11-06: qty 1
  Filled 2014-11-06: qty 2
  Filled 2014-11-06: qty 1

## 2014-11-06 MED ORDER — FERROUS SULFATE 325 (65 FE) MG PO TABS
325.0000 mg | ORAL_TABLET | Freq: Every day | ORAL | Status: DC
  Administered 2014-11-06 – 2014-11-10 (×5): 325 mg via ORAL
  Filled 2014-11-06 (×6): qty 1

## 2014-11-06 MED ORDER — ONDANSETRON HCL 4 MG/2ML IJ SOLN
4.0000 mg | Freq: Four times a day (QID) | INTRAMUSCULAR | Status: DC | PRN
  Filled 2014-11-06: qty 2

## 2014-11-06 MED ORDER — NEOSTIGMINE METHYLSULFATE 10 MG/10ML IV SOLN
INTRAVENOUS | Status: AC
  Filled 2014-11-06: qty 1

## 2014-11-06 MED ORDER — DIPHENHYDRAMINE HCL 50 MG/ML IJ SOLN
INTRAMUSCULAR | Status: DC | PRN
  Administered 2014-11-06: 6.25 mg via INTRAVENOUS

## 2014-11-06 MED ORDER — EPHEDRINE SULFATE 50 MG/ML IJ SOLN
INTRAMUSCULAR | Status: DC | PRN
  Administered 2014-11-06 (×2): 10 mg via INTRAVENOUS

## 2014-11-06 MED ORDER — LIDOCAINE HCL (CARDIAC) 20 MG/ML IV SOLN
INTRAVENOUS | Status: AC
  Filled 2014-11-06: qty 5

## 2014-11-06 MED ORDER — BRIMONIDINE TARTRATE 0.2 % OP SOLN
1.0000 [drp] | Freq: Two times a day (BID) | OPHTHALMIC | Status: DC
  Administered 2014-11-06 – 2014-11-10 (×8): 1 [drp] via OPHTHALMIC
  Filled 2014-11-06: qty 5

## 2014-11-06 MED ORDER — LATANOPROST 0.005 % OP SOLN
1.0000 [drp] | Freq: Every day | OPHTHALMIC | Status: DC
  Administered 2014-11-06 – 2014-11-09 (×3): 1 [drp] via OPHTHALMIC
  Filled 2014-11-06 (×2): qty 2.5

## 2014-11-06 MED ORDER — PANTOPRAZOLE SODIUM 40 MG PO TBEC
40.0000 mg | DELAYED_RELEASE_TABLET | Freq: Every day | ORAL | Status: DC
  Administered 2014-11-06 – 2014-11-10 (×5): 40 mg via ORAL
  Filled 2014-11-06 (×5): qty 1

## 2014-11-06 MED ORDER — ACETAMINOPHEN 650 MG RE SUPP: 650.0000 mg | Freq: Four times a day (QID) | RECTAL | Status: DC | PRN

## 2014-11-06 MED ORDER — METOCLOPRAMIDE HCL 5 MG/ML IJ SOLN
5.0000 mg | Freq: Three times a day (TID) | INTRAMUSCULAR | Status: DC | PRN
  Administered 2014-11-08: 10 mg via INTRAVENOUS
  Filled 2014-11-06: qty 2

## 2014-11-06 MED ORDER — PHENYLEPHRINE HCL 10 MG/ML IJ SOLN
INTRAMUSCULAR | Status: DC | PRN
  Administered 2014-11-06: 80 ug via INTRAVENOUS

## 2014-11-06 MED ORDER — METFORMIN HCL 500 MG PO TABS
500.0000 mg | ORAL_TABLET | Freq: Two times a day (BID) | ORAL | Status: DC
  Administered 2014-11-06 – 2014-11-10 (×8): 500 mg via ORAL
  Filled 2014-11-06 (×10): qty 1

## 2014-11-06 MED FILL — Vancomycin HCl For IV Soln 1 GM (Base Equivalent): INTRAVENOUS | Qty: 1000 | Status: AC

## 2014-11-06 SURGICAL SUPPLY — 67 items
BANDAGE ELASTIC 6 VELCRO ST LF (GAUZE/BANDAGES/DRESSINGS) ×3 IMPLANT
BANDAGE ESMARK 6X9 LF (GAUZE/BANDAGES/DRESSINGS) ×1 IMPLANT
BLADE SAW SGTL 13X75X1.27 (BLADE) ×3 IMPLANT
BLADE SURG 10 STRL SS (BLADE) ×3 IMPLANT
BNDG ESMARK 6X9 LF (GAUZE/BANDAGES/DRESSINGS) ×3
BNDG GAUZE ELAST 4 BULKY (GAUZE/BANDAGES/DRESSINGS) ×6 IMPLANT
BOLT STRUT CONNECTION (EXFIX) ×45 IMPLANT
CANISTER SUCT 3000ML PPV (MISCELLANEOUS) ×3 IMPLANT
CHLORAPREP W/TINT 26ML (MISCELLANEOUS) ×3 IMPLANT
COVER SURGICAL LIGHT HANDLE (MISCELLANEOUS) ×3 IMPLANT
CUFF TOURNIQUET SINGLE 34IN LL (TOURNIQUET CUFF) ×3 IMPLANT
DRAPE C-ARM 42X72 X-RAY (DRAPES) ×3 IMPLANT
DRAPE C-ARMOR (DRAPES) ×3 IMPLANT
DRAPE U-SHAPE 47X51 STRL (DRAPES) ×3 IMPLANT
DRSG MEPITEL 4X7.2 (GAUZE/BANDAGES/DRESSINGS) ×3 IMPLANT
DRSG PAD ABDOMINAL 8X10 ST (GAUZE/BANDAGES/DRESSINGS) ×6 IMPLANT
ELECT REM PT RETURN 9FT ADLT (ELECTROSURGICAL) ×3
ELECTRODE REM PT RTRN 9FT ADLT (ELECTROSURGICAL) ×1 IMPLANT
FOOT ARCH CARBON FIBER 180MM (EXFIX) ×3 IMPLANT
FOOT RING LONG 180MM (EXFIX) ×6 IMPLANT
GAUZE SPONGE 4X4 12PLY STRL (GAUZE/BANDAGES/DRESSINGS) ×3 IMPLANT
GLOVE BIO SURGEON STRL SZ8 (GLOVE) ×3 IMPLANT
GLOVE BIOGEL PI IND STRL 7.0 (GLOVE) ×1 IMPLANT
GLOVE BIOGEL PI IND STRL 8 (GLOVE) ×4 IMPLANT
GLOVE BIOGEL PI INDICATOR 7.0 (GLOVE) ×2
GLOVE BIOGEL PI INDICATOR 8 (GLOVE) ×8
GLOVE ECLIPSE 7.5 STRL STRAW (GLOVE) ×3 IMPLANT
GLOVE SURG SS PI 8.0 STRL IVOR (GLOVE) ×6 IMPLANT
GOWN STRL REUS W/ TWL LRG LVL3 (GOWN DISPOSABLE) ×2 IMPLANT
GOWN STRL REUS W/ TWL XL LVL3 (GOWN DISPOSABLE) ×1 IMPLANT
GOWN STRL REUS W/TWL LRG LVL3 (GOWN DISPOSABLE) ×4
GOWN STRL REUS W/TWL XL LVL3 (GOWN DISPOSABLE) ×2
HINGE COUPLING (EXFIX) ×3 IMPLANT
K-WIRE PLA 9 .062 (WIRE) ×6 IMPLANT
KIT BASIN OR (CUSTOM PROCEDURE TRAY) ×3 IMPLANT
KIT ROOM TURNOVER OR (KITS) ×3 IMPLANT
NEEDLE 22X1 1/2 (OR ONLY) (NEEDLE) IMPLANT
NS IRRIG 1000ML POUR BTL (IV SOLUTION) ×3 IMPLANT
NUT CONNECTING M8 LOG (EXFIX) ×9 IMPLANT
NUT CONNECTING M8 SHORT (EXFIX) ×51 IMPLANT
NUT THREAD ROD CONNECTING M6 (EXFIX) ×54 IMPLANT
PACK ORTHO EXTREMITY (CUSTOM PROCEDURE TRAY) ×3 IMPLANT
PAD ARMBOARD 7.5X6 YLW CONV (MISCELLANEOUS) ×3 IMPLANT
PADDING CAST ABS 4INX4YD NS (CAST SUPPLIES) ×2
PADDING CAST ABS 6INX4YD NS (CAST SUPPLIES) ×2
PADDING CAST ABS COTTON 4X4 ST (CAST SUPPLIES) ×1 IMPLANT
PADDING CAST ABS COTTON 6X4 NS (CAST SUPPLIES) ×1 IMPLANT
RING FULL EXFIX 180MM (EXFIX) ×6 IMPLANT
ROD THREADED 3MMX150MM (EXFIX) ×12 IMPLANT
ROD THREADED EXFIX 6X100 (EXFIX) ×3 IMPLANT
SHOE ROCKER 180-210MM (EXFIX) ×3 IMPLANT
SPONGE GAUZE 4X4 12PLY STER LF (GAUZE/BANDAGES/DRESSINGS) ×3 IMPLANT
SPONGE LAP 18X18 X RAY DECT (DISPOSABLE) ×3 IMPLANT
STRUT STATIC LONG EXFIX 60MM (EXFIX) ×9 IMPLANT
STRUT STATIC MED EXFIX 40MM (EXFIX) ×12 IMPLANT
SUCTION FRAZIER TIP 10 FR DISP (SUCTIONS) ×3 IMPLANT
SUT ETHILON 2 0 FS 18 (SUTURE) ×3 IMPLANT
SUT ETHILON 3 0 FSL (SUTURE) ×3 IMPLANT
TOWEL OR 17X24 6PK STRL BLUE (TOWEL DISPOSABLE) ×3 IMPLANT
TOWEL OR 17X26 10 PK STRL BLUE (TOWEL DISPOSABLE) ×3 IMPLANT
TUBE CONNECTING 12'X1/4 (SUCTIONS) ×1
TUBE CONNECTING 12X1/4 (SUCTIONS) ×2 IMPLANT
WATER STERILE IRR 1000ML POUR (IV SOLUTION) ×3 IMPLANT
WIRE BOLT LONG (Wire) ×3 IMPLANT
WIRE BOLT SHORT (Wire) ×51 IMPLANT
WIRE DIAMOND POINT 2MM (Wire) ×21 IMPLANT
WIRE DIAMOND POINT 2MM W/OLIVE (Wire) ×6 IMPLANT

## 2014-11-06 NOTE — Transfer of Care (Signed)
Immediate Anesthesia Transfer of Care Note  Patient: [name]  Procedure(s) Performed: Procedure(s): LEFT ACHILLES TENDON LENGTHENING,  (Left) TALONAVICULAR CANCELLOUS CUBOID ARTHRODESIS WITH CORRECTIVE OSTEOTOMY  (Left)  Patient Location: PACU  Anesthesia Type:GA combined with regional for post-op pain  Level of Consciousness: awake, lethargic and responds to stimulation  Airway & Oxygen Therapy: Patient Spontanous Breathing and Patient connected to nasal cannula oxygen  Post-op Assessment: Report given to RN, Post -op Vital signs reviewed and stable and Patient moving all extremities  Post vital signs: stable  Last Vitals:  Filed Vitals:   11/06/14 0925  BP: 107/52  Pulse:   Temp:   Resp: 9    Complications: No apparent anesthesia complications

## 2014-11-06 NOTE — Evaluation (Signed)
Physical Therapy Evaluation Patient Details Name: [name] MRN: [id number] DOB: 06-26-1955 Today's Date: 11/06/2014   History of Present Illness  59 y.o. female s/p Percutaneous left achilles tendon lengthening, Left talonavicular and calcaneocuboid arthrodeses with corrective osteotomy, and Application of thin wire circular frame to left lower extremity. Hx of glaucoma, CKD, HTN, DMII, and normocytic anemia.  Clinical Impression  Patient is seen following the above procedure, presenting with functional limitations due to the deficits listed below (see PT Problem List). Previously independent with a cane, pt now requires min assist for bed mobility and transfers. Reviewed safe DME use with a rolling walker and pt was able to maintain NWB on LLE while taking several steps forward today however, she was very fatigued with this very short distance. Lives with an 56+ y.o. Friend that will not be able to provide assistance at home. Anticipate Mrs. Roedel will benefit most from ST-SNF to ensure safety with mobility prior to returning home. We will attempt to progress her as quickly as safely tolerated. She will benefit from skilled PT to increase their independence and safety with mobility to allow discharge to the venue listed below.       Follow Up Recommendations SNF;Supervision for mobility/OOB     Equipment Recommendations  None recommended by PT    Recommendations for Other Services OT consult     Precautions / Restrictions Precautions Precautions: Fall Required Braces or Orthoses: Other Brace/Splint Other Brace/Splint: external fixator on LLE Restrictions Weight Bearing Restrictions: No      Mobility  Bed Mobility Overal bed mobility: Needs Assistance Bed Mobility: Supine to Sit     Supine to sit: Min assist;HOB elevated     General bed mobility comments: Min assist for LLE support out of bed. VC for technique throughout.  Transfers Overall transfer level: Needs  assistance Equipment used: Rolling walker (2 wheeled) Transfers: Sit to/from Stand Sit to Stand: Min assist         General transfer comment: Min assist for boost and balance from lowest bed setting. VC for hand placement. [place] [employer]  Ambulation/Gait Ambulation/Gait assistance: Min assist Ambulation Distance (Feet): 5 Feet Assistive device: Rolling walker (2 wheeled) Gait Pattern/deviations: Step-to pattern Gait velocity: slow Gait velocity interpretation: Below normal speed for age/gender General Gait Details: Educated on safe DME use with a rolling walker and instructions to keep NWB through LLE. Able to take several steps forward today but fatigues rapidly. Safely maintains NWB with this short distance.  Stairs            Wheelchair Mobility    Modified Rankin (Stroke Patients Only)       Balance Overall balance assessment: Needs assistance Sitting-balance support: No upper extremity supported;Feet supported Sitting balance-Leahy Scale: Fair     Standing balance support: Bilateral upper extremity supported Standing balance-Leahy Scale: Poor                               Pertinent Vitals/Pain Pain Assessment: No/denies pain    Home Living Family/patient expects to be discharged to:: Inpatient rehab Living Arrangements: Non-relatives/Friends Available Help at Discharge: Friend(s);Available PRN/intermittently Type of Home: House Home Access: Stairs to enter Entrance Stairs-Rails: Doctor, general practice of Steps: 2 Home Layout: One level Home Equipment: Tub bench;Walker - 2 wheels;Cane - single point      Prior Function Level of Independence: Independent with assistive device(s)         Comments: using  cane     Hand Dominance   Dominant Hand: Right    Extremity/Trunk Assessment   Upper Extremity Assessment: Defer to OT evaluation           Lower Extremity Assessment: LLE deficits/detail   LLE Deficits /  Details: no light touch sensation below ankle. Limited assessment due to immobilization     Communication   Communication: No difficulties  Cognition Arousal/Alertness: Awake/alert Behavior During Therapy: WFL for tasks assessed/performed Overall Cognitive Status: Within Functional Limits for tasks assessed                      General Comments General comments (skin integrity, edema, etc.): Instructions of safe mobility and to keep LLE elevated    Exercises        Assessment/Plan    PT Assessment Patient needs continued PT services  PT Diagnosis Difficulty walking;Abnormality of gait;Generalized weakness   PT Problem List Decreased strength;Decreased activity tolerance;Decreased balance;Decreased mobility;Decreased knowledge of use of DME;Decreased knowledge of precautions;Impaired sensation;Obesity  PT Treatment Interventions DME instruction;Gait training;Stair training;Functional mobility training;Therapeutic activities;Therapeutic exercise;Balance training;Neuromuscular re-education;Patient/family education;Modalities   PT Goals (Current goals can be found in the Care Plan section) Acute Rehab PT Goals Patient Stated Goal: Go to rehab PT Goal Formulation: With patient Time For Goal Achievement: 11/20/14 Potential to Achieve Goals: Good    Frequency Min 5X/week   Barriers to discharge Inaccessible home environment;Decreased caregiver support 2 steps to enter home and roommate is <25 years old. States roommate will not be able to assist pt.    Co-evaluation               End of Session Equipment Utilized During Treatment: Gait belt Activity Tolerance: Patient limited by fatigue Patient left: in chair;with call bell/phone within reach;with nursing/sitter in room;with SCD's reapplied Nurse Communication: Mobility status;Precautions;Weight bearing status         Time: 3354-5625 (-5 minutes non-therapeutic while RN gave meds) PT Time Calculation (min)  (ACUTE ONLY): 31 min   Charges:   PT Evaluation $Initial PT Evaluation Tier I: 1 Procedure PT Treatments $Therapeutic Activity: 8-22 mins   PT G CodesBerton Mount 11/06/2014, 5:21 PM [place], [place] [phone]

## 2014-11-06 NOTE — Anesthesia Procedure Notes (Addendum)
Procedure Name: Intubation Date/Time: 11/06/2014 9:38 AM Performed by: Little Ishikawa L Pre-anesthesia Checklist: Patient identified, Emergency Drugs available, Suction available, Patient being monitored and Timeout performed Patient Re-evaluated:Patient Re-evaluated prior to inductionOxygen Delivery Method: Circle system utilized Preoxygenation: Pre-oxygenation with 100% oxygen Intubation Type: IV induction Ventilation: Mask ventilation without difficulty Laryngoscope Size: Mac and 3 Grade View: Grade I Tube type: Oral Tube size: 7.0 mm Number of attempts: 1 Airway Equipment and Method: Stylet Placement Confirmation: ETT inserted through vocal cords under direct vision,  positive ETCO2 and breath sounds checked- equal and bilateral Secured at: 22 cm Tube secured with: Tape Dental Injury: Teeth and Oropharynx as per pre-operative assessment    Anesthesia Regional Block:  Popliteal block  Pre-Anesthetic Checklist: ,, timeout performed, Correct Patient, Correct Site, Correct Laterality, Correct Procedure, Correct Position, site marked, Risks and benefits discussed, Surgical consent,  Pre-op evaluation,  At surgeon's request  Laterality: Lower and Left  Prep: chloraprep       Needles:  Injection technique: Single-shot  Needle Type: Echogenic Needle          Additional Needles:  Procedures: ultrasound guided (picture in chart) Popliteal block Narrative:  Injection made incrementally with aspirations every 5 mL.  Performed by: Personally   Additional Notes: H+P and labs reviewed, risks and benefits discussed with patient, procedure tolerated well without complications

## 2014-11-06 NOTE — Anesthesia Postprocedure Evaluation (Signed)
  Anesthesia Post-op Note  Patient: [name]  Procedure(s) Performed: Procedure(s): LEFT ACHILLES TENDON LENGTHENING,  (Left) TALONAVICULAR CANCELLOUS CUBOID ARTHRODESIS WITH CORRECTIVE OSTEOTOMY  (Left)  Patient Location: PACU  Anesthesia Type:GA combined with regional for post-op pain  Level of Consciousness: awake  Airway and Oxygen Therapy: Patient Spontanous Breathing  Post-op Pain: none  Post-op Assessment: Post-op Vital signs reviewed, Patient's Cardiovascular Status Stable, Respiratory Function Stable, Patent Airway, No signs of Nausea or vomiting and Pain level controlled              Post-op Vital Signs: Reviewed and stable  Last Vitals:  Filed Vitals:   11/06/14 1442  BP: 153/72  Pulse: 55  Temp: 36.2 C  Resp: 18    Complications: No apparent anesthesia complications

## 2014-11-06 NOTE — Op Note (Signed)
[name], [name]              ACCOUNT NO.:  [id number]  MEDICAL RECORD NO.:  [id number]  LOCATION:  5N12C                        FACILITY:  [facility]  PHYSICIAN:  Toni Arthurs, MD        DATE OF BIRTH:  [birth year]  DATE OF PROCEDURE:  11/06/2014 DATE OF DISCHARGE:                              OPERATIVE REPORT   PREOPERATIVE DIAGNOSES: 1. Left foot Charcot rocker bottom deformity. 2. Chronic left foot plantar diabetic ulcer.  POSTOPERATIVE DIAGNOSES: 1. Left foot Charcot rocker bottom deformity. 2. Chronic left foot plantar diabetic ulcer.  PROCEDURES: 1. Percutaneous left Achilles tendon lengthening. 2. Left talonavicular and calcaneocuboid arthrodesis with corrective     biplanar osteotomy. 3. Application of thin wire circular frame external fixator to the     left lower extremity.  SURGEON:  Toni Arthurs, M.D.  ASSISTANT:  [name], PA-C.  ANESTHESIA:  General, regional.  ESTIMATED BLOOD LOSS:  Minimal.  TOURNIQUET TIME:  89 minutes at 250 mmHg.  COMPLICATIONS:  None apparent.  DISPOSITION:  Extubated awake and stable to Recovery.  INDICATIONS FOR PROCEDURE:  The patient is a 59 year old female with past medical history significant for diabetes, complicated by peripheral neuropathy.  She has a history of a Charcot foot deformity and has developed a rocker bottom deformity.  She has a chronic nonhealing ulcer at the plantar aspect of the foot beneath the cuboid.  She has failed nonoperative treatment for this condition.  She presents now for surgical management.  She understands the risks and benefits of the alternative treatment options and elects surgical treatment.  She specifically understands risks of bleeding, infection, nerve damage, blood clots, need for additional surgery, continued pain, recurrence of her deformity, amputation, and death.  PROCEDURE IN DETAIL:  After preoperative consent was obtained and the correct operative site was identified, the  patient was brought to the operating room and placed supine on the operating table.  General anesthesia was induced.  Preoperative antibiotics were administered. Surgical time-out was taken.  The left lower extremity was prepped and draped in standard sterile fashion with a tourniquet around the thigh. The extremity was exsanguinated and tourniquet was inflated to 250 mmHg. A percutaneous triple hemi-section Achilles tendon lengthening was then performed.  This allowed the ankle to dorsiflex to a neutral position. A longitudinal incision was then made adjacent to the calcaneocuboid joint.  Sharp dissection was carried down through skin, subcutaneous tissue.  The peroneal tendons were identified and protected throughout the case.  The calcaneocuboid joint was identified.  Subperiosteal dissection was carried dorsally and plantarly across the level of transverse tarsal joint.  Attention was then turned to the medial aspect of the hindfoot where a longitudinal incision was made adjacent to the talonavicular joint. Sharp dissection was carried down through the skin and subcutaneous tissue.  A full-thickness sleeve of soft tissue was then elevated plantarly and dorsally, and subperiosteal dissection was then carried dorsally and plantarly along the talonavicular joint.  K-wires were then inserted from medial to lateral at the site of the proposed biplanar osteotomy.  AP and lateral radiographs confirmed appropriate positioning of the guide pins.  An oscillating saw was then used to cut a trapezoidal  wedge of bone out at the level of the talonavicular and calcaneocuboid joints.  This was biased to take more bone plantarly than dorsally.  The cut bone was all removed with curettes and rongeurs.  The wound was irrigated copiously.  The joints were reduced and provisionally pinned.  AP and lateral foot x-rays showed appropriate alignment of the osteotomy sites.  The bone removed from the midfoot  was morselized and packed in as graft.  The wounds were irrigated and sprinkled with vancomycin powder.  The skin incisions were closed with horizontal mattress sutures of 2-0 nylon.  Tourniquet was released.  A thin wire circular frame was then applied to the right lower extremity. Foot was centralized within the frame and held with folded towels.  A smooth wire was then inserted obliquely across the tibia at the level of the proximal ring.  A second wire was placed in an X-pattern relative to the first wire.  Both were attached to the proximal ring and tightened appropriately.  This was a Engineering geologist frame.  The next ring down was utilized to insert two more wires in crossing fashion.  Both of these were tensioned and tightened.  Two Olive wires were then placed at the calcaneus through small stab incisions.  These were attached to the U-shaped foot ring and tensioned and tightened.  Wires were then advanced across of the lateral metatarsals and medial metatarsals as well as through the midfoot to the level of the cuneiforms.  The wires were then pulled back to the U-shape foot ring and attached in that fashion to bow the wires slightly.  The wires were then tensioned compressing the transverse osteotomy.  AP and lateral foot films as well as AP and lateral ankle films were obtained, showing appropriate position and length of the wires and appropriate position of the circular frame.  Sterile dressings were applied followed by compression wrap.  The tourniquet had been released prior to applying the frame at approximately 90 minutes.  The patient was awakened from anesthesia and transported to the recovery room in stable condition.  FOLLOWUP PLAN:  The patient will be nonweightbearing on the right lower extremity.  She will be admitted to the hospital for physical therapy and occupational therapy consults.  She may need placement in a skilled nursing facility since she does not  have any help at home.  She will start aspirin for DVT prophylaxis upon discharge.     Toni Arthurs, MD     JH/MEDQ  D:  11/06/2014  T:  11/06/2014  Job:  [id number]

## 2014-11-06 NOTE — Anesthesia Preprocedure Evaluation (Addendum)
Anesthesia Evaluation  Patient identified by MRN, date of birth, ID band Patient awake    Reviewed: Allergy & Precautions, NPO status , Patient's Chart, lab work & pertinent test results, reviewed documented beta blocker date and time   History of Anesthesia Complications Negative for: history of anesthetic complications  Airway Mallampati: I  TM Distance: >3 FB     Dental  (+) Dental Advisory Given, Missing,    Pulmonary sleep apnea , former smoker,    breath sounds clear to auscultation       Cardiovascular hypertension, Pt. on medications (-) angina(-) Past MI and (-) CHF  Rhythm:Regular Rate:Normal     Neuro/Psych negative neurological ROS  negative psych ROS   GI/Hepatic Neg liver ROS, hiatal hernia, GERD  Medicated and Controlled,  Endo/Other  diabetes, Well Controlled, Type 2, Oral Hypoglycemic AgentsMorbid obesity  Renal/GU Renal InsufficiencyRenal disease  negative genitourinary   Musculoskeletal  (+) Arthritis ,   Abdominal (+)  Abdomen: soft. Bowel sounds: normal.  Peds  Hematology  (+) anemia ,   Anesthesia Other Findings   Reproductive/Obstetrics                         Anesthesia Physical Anesthesia Plan  ASA: II  Anesthesia Plan: General and Regional   Post-op Pain Management:    Induction: Intravenous  Airway Management Planned: Oral ETT  Additional Equipment: None  Intra-op Plan:   Post-operative Plan: Extubation in OR  Informed Consent: I have reviewed the patients History and Physical, chart, labs and discussed the procedure including the risks, benefits and alternatives for the proposed anesthesia with the patient or authorized representative who has indicated his/her understanding and acceptance.   Dental advisory given  Plan Discussed with: CRNA and Surgeon  Anesthesia Plan Comments:         Anesthesia Quick Evaluation

## 2014-11-06 NOTE — Care Management (Signed)
Utilization review completed. Tamesha Ellerbrock, RN Case Manager [phone]. 

## 2014-11-06 NOTE — Progress Notes (Signed)
Alcario Drought RN at bedside to get report.

## 2014-11-06 NOTE — Brief Op Note (Addendum)
11/06/2014  12:36 PM  PATIENT:  [name]  59 y.o. female  PRE-OPERATIVE DIAGNOSIS: 1.  Left foot charcot rocker-bottom deformity      2.  Chronic left foot plantar diabetic ulcer  POST-OPERATIVE DIAGNOSIS:  same   Procedure(s): 1.  Percutaneous left achilles tendon lengthening 2.  Left talonavicular and calcaneocuboid arthrodeses with corrective osteotomy 3.  Application of thin wire circular frame to left lower extremity  SURGEON:  Toni Arthurs, MD  ASSISTANT: [name], PA-C  ANESTHESIA:   General, regional  EBL:  minimal   TOURNIQUET:   Total Tourniquet Time Documented: Thigh (Left) - 89 minutes Total: Thigh (Left) - 89 minutes  COMPLICATIONS:  None apparent  DISPOSITION:  Extubated, awake and stable to recovery.  DICTATION ID:  [id number]

## 2014-11-06 NOTE — Addendum Note (Signed)
Addendum  created 11/06/14 1459 by Val Eagle, MD   Modules edited: Anesthesia Blocks and Procedures, Anesthesia Medication Administration, Clinical Notes   Clinical Notes:  File: [id number]

## 2014-11-06 NOTE — H&P (Signed)
[name] is an 59 y.o. female.   Chief Complaint:  Left foot plantar ulcer HPI: 59 y/o female with PMH of diabetes c/b peripheral neuropathy presents with a left charcot foot deformity and a long hx of nonhealing plantar ulcer.  She has failed non op treatment of this ulcer and presents today for correction of her rocker bottom deformity.  Currently she's taking no abx.   Past Medical History  Diagnosis Date  . Normocytic anemia   . Hypertension   . Foot ulcer, left ([place])   . Leg edema   . Glaucoma   . GERD (gastroesophageal reflux disease)   . High cholesterol   . Type II diabetes mellitus ([place])   . OSA on CPAP   . H/O hiatal hernia   . Arthritis     "right leg" (01/30/2013)  . CKD (chronic kidney disease), stage III     /notes 01/30/2013    Past Surgical History  Procedure Laterality Date  . Oophorectomy Bilateral 1975-1976  . Appendectomy  1975  . I&d extremity Left 2011    "ulcer on my foot got infected" (01/30/2013)  . Multiple tooth extractions Bilateral     Family History  Problem Relation Age of Onset  . Diabetes Mother   . Hypertension Mother   . Heart disease Father   . Stroke Father   . Diabetes Father   . Hypertension Father   . Gout Father   . Deep vein thrombosis Father    Social History:  reports that she quit smoking about 41 years ago. Her smoking use included Cigarettes. She has a 2 pack-year smoking history. She has never used smokeless tobacco. She reports that she does not drink alcohol or use illicit drugs.  Allergies:  Allergies  Allergen Reactions  . Codeine     REACTION: swelling in throat and itching  . Pravastatin Other (See Comments)    cramps    Medications Prior to Admission  Medication Sig Dispense Refill  . acetaminophen (TYLENOL) 325 MG tablet Take 1 tablet (325 mg total) by mouth every 6 (six) hours as needed. For back pain. 30 tablet   . aspirin EC 81 MG tablet Take 81 mg by mouth daily.    Marland Kitchen atorvastatin (LIPITOR) 40  MG tablet Take 1 tablet (40 mg total) by mouth daily. 30 tablet 0  . Biotin 1000 MCG tablet Take 1,000 mcg by mouth daily.    . Blood Glucose Monitoring Suppl (ACCU-CHEK AVIVA PLUS) W/DEVICE KIT Use to Check Blood Sugars Daily. Dx Code: E11.9. 1 kit 0  . Calcium Carbonate-Vitamin D 600-400 MG-UNIT per chew tablet Chew 1 tablet by mouth 2 (two) times daily. 30 tablet 11  . COMBIGAN 0.2-0.5 % ophthalmic solution Place 1 drop into both eyes 2 (two) times daily.    . ferrous sulfate 325 (65 FE) MG tablet Take 1 tablet (325 mg total) by mouth daily. With orange juice 30 tablet 0  . furosemide (LASIX) 40 MG tablet TAKE 1 TABLET (40 MG TOTAL) BY MOUTH DAILY. 30 tablet 2  . glucose blood (ACCU-CHEK AVIVA PLUS) test strip Use to check blood sugar 1 time daily. Dx code E11.9. Non insulin dependent 50 each 3  . glucose blood (ACCU-CHEK AVIVA PLUS) test strip Use to check blood sugar 1 time daily. diag code E11.9. Non insulin dependent 100 each 3  . lisinopril (PRINIVIL,ZESTRIL) 20 MG tablet TAKE 1 TABLET (20 MG TOTAL) BY MOUTH DAILY. 30 tablet 0  . LUMIGAN 0.01 %  SOLN Place 1 drop into both eyes every evening.    . metFORMIN (GLUCOPHAGE) 1000 MG tablet Take 0.5 tablets (500 mg total) by mouth 2 (two) times daily with a meal. 180 tablet 3  . Multiple Vitamin (MULTIVITAMIN) tablet Take 1 tablet by mouth daily.     . pantoprazole (PROTONIX) 40 MG tablet TAKE 1 TABLET BY MOUTH DAILY 90 tablet 4  . sitaGLIPtin (JANUVIA) 100 MG tablet Take 1 tablet (100 mg total) by mouth daily. 90 tablet 3  . vitamin C (ASCORBIC ACID) 500 MG tablet Take 500 mg by mouth daily.    . Zinc 50 MG CAPS Take 1 capsule by mouth daily.      No results found for this or any previous visit (from the past 48 hour(s)). No results found.  ROS  No recent f/c/n/v/wt loss.    Blood pressure 113/78, pulse 71, temperature 98.1 F (36.7 C), temperature source Oral, resp. rate 20, height 5' 9.5" (1.765 m), weight 110.678 kg (244 lb), SpO2 100  %. Physical Exam   Assessment/Plan L charcot rocker bottom foot deformity with non healing ulcer - to OR for left talonavicular and calcaneocuboid arthrodesis with corrective osteotomy, heelcord lengthening and application of a thin wire circular frame.  The risks and benefits of the alternative treatment options have been discussed in detail.  The patient wishes to proceed with surgery and specifically understands risks of bleeding, infection, nerve damage, blood clots, need for additional surgery, amputation and death.   [name] Simmer 2014-11-28, 8:53 AM

## 2014-11-07 ENCOUNTER — Encounter (HOSPITAL_COMMUNITY): Payer: Self-pay | Admitting: Orthopedic Surgery

## 2014-11-07 LAB — GLUCOSE, CAPILLARY
GLUCOSE-CAPILLARY: 110 mg/dL — AB (ref 65–99)
GLUCOSE-CAPILLARY: 106 mg/dL — AB (ref 65–99)
GLUCOSE-CAPILLARY: 115 mg/dL — AB (ref 65–99)
GLUCOSE-CAPILLARY: 135 mg/dL — AB (ref 65–99)
GLUCOSE-CAPILLARY: 107 mg/dL — AB (ref 65–99)
Glucose-Capillary: 94 mg/dL (ref 65–99)

## 2014-11-07 MED ORDER — HYDROMORPHONE HCL 2 MG PO TABS
2.0000 mg | ORAL_TABLET | ORAL | Status: DC | PRN
  Administered 2014-11-07 – 2014-11-08 (×3): 4 mg via ORAL
  Filled 2014-11-07 (×4): qty 2

## 2014-11-07 NOTE — Progress Notes (Signed)
Physical Therapy Treatment Patient Details Name: [name] MRN: [id number] DOB: [birth year] Today's Date: 11/07/2014    History of Present Illness 59 y.o. female s/p Percutaneous left achilles tendon lengthening, Left talonavicular and calcaneocuboid arthrodeses with corrective osteotomy, and Application of thin wire circular frame to left lower extremity. Hx of glaucoma, CKD, HTN, DMII, and normocytic anemia.    PT Comments    Pt was seen for evaluation of current gait and is progrssing well.  Her pain was managed by nursing before getting up and did well controlling NWB.  Will definitely benefit from SNF due to her low distances walking, need for help for all mobility of 2 people and risk of injury with inadequate help.  Follow Up Recommendations  SNF;Supervision for mobility/OOB     Equipment Recommendations  None recommended by PT    Recommendations for Other Services OT consult     Precautions / Restrictions Precautions Precautions: Fall Required Braces or Orthoses: Other Brace/Splint Other Brace/Splint: external fixator on LLE Restrictions Weight Bearing Restrictions: Yes LLE Weight Bearing: Non weight bearing    Mobility  Bed Mobility Overal bed mobility: Needs Assistance Bed Mobility: Supine to Sit     Supine to sit: Min guard;Min assist;HOB elevated     General bed mobility comments: Min assist for LLE support out of bed. VC for technique throughout.  Transfers Overall transfer level: Needs assistance Equipment used: Rolling walker (2 wheeled) Transfers: Sit to/from Stand Sit to Stand: Min assist         General transfer comment: min assist to power up and min to steady with cues to sit with NWB maintained  Ambulation/Gait Ambulation/Gait assistance: Min assist Ambulation Distance (Feet): 10 Feet Assistive device: Rolling walker (2 wheeled)   Gait velocity: slow Gait velocity interpretation: Below normal speed for age/gender General Gait  Details: slides on RLE on floor, gentle hops with pt having trouble with balance to do this   Stairs            Wheelchair Mobility    Modified Rankin (Stroke Patients Only)       Balance Overall balance assessment: Needs assistance Sitting-balance support: Bilateral upper extremity supported Sitting balance-Leahy Scale: Fair     Standing balance support: Bilateral upper extremity supported Standing balance-Leahy Scale: Poor Standing balance comment: controlling better with BUE's                     Cognition Arousal/Alertness: Awake/alert Behavior During Therapy: WFL for tasks assessed/performed Overall Cognitive Status: Within Functional Limits for tasks assessed                      Exercises      General Comments General comments (skin integrity, edema, etc.): Pt was able to follow verbal cues to maintain NWB but has fatigue with the effort to walk      Pertinent Vitals/Pain Pain Assessment: Faces Pain Score: 4  Faces Pain Scale: Hurts little more Pain Location: L akle Pain Descriptors / Indicators: Operative site guarding Pain Intervention(s): Monitored during session;Premedicated before session;Repositioned    Home Living                      Prior Function            PT Goals (current goals can now be found in the care plan section) Acute Rehab PT Goals Patient Stated Goal: Go to rehab Progress towards PT goals: Progressing toward goals  Frequency  Min 5X/week    PT Plan Current plan remains appropriate    Co-evaluation             End of Session Equipment Utilized During Treatment: Gait belt Activity Tolerance: Patient limited by fatigue Patient left: in chair;with call bell/phone within reach;Other (comment) (LLE elevated)     Time: 6387-5643 PT Time Calculation (min) (ACUTE ONLY): 32 min  Charges:  $Gait Training: 8-22 mins $Therapeutic Activity: 8-22 mins                    G Codes:       Ivar Drape November 17, 2014, 11:47 AM   Samul Dada, PT MS Acute Rehab Dept. Number: [facility] [id number] and [facility] [phone]

## 2014-11-07 NOTE — Clinical Social Work Note (Signed)
Clinical Social Work Assessment  Patient Details  Name: [name] MRN: [id number] Date of Birth: 1955/08/14  Date of referral:  11/07/14               Reason for consult:  Facility Placement                Permission sought to share information with:  Facility Industrial/product designer granted to share information::     Name::        Agency::  SNF Admissions  Relationship::     Contact Information:     Housing/Transportation Living arrangements for the past 2 months:  Single Family Home Source of Information:  Patient Patient Interpreter Needed:  None Criminal Activity/Legal Involvement Pertinent to Current Situation/Hospitalization:  No - Comment as needed Significant Relationships:  Other(Comment) (Patient's relative [name]) Lives with:  Self Do you feel safe going back to the place where you live?  Yes (Patient feels that she needs to have some physical therapy in order to return back home.) Need for family participation in patient care:  No (Coment)  Care giving concerns: Patient feels she needs some short term rehab in order to get her strength back up in order to return back home.   Social Worker assessment / plan:  Patient is a pleasant 58 year old female who lives alone and is alert and oriented x4.  Patient expressed that she has not been to rehab before.  Patient was explained SNF search process and how insurance pays for her stay at a SNF.  Patient expressed that she has heard good things about [facility], [facility], and [facility].  Patient is in agreement to going to SNF short term rehab, patient did not express any concerns or issues about going to SNF.  Patient's questions were answered, and she felt like she has a good understanding of the process.  Employment status:  Disabled (Comment on whether or not currently receiving Disability) Insurance information:  Medicare, [place] PT Recommendations:  Skilled Nursing  Facility Information / Referral to community resources:  Skilled Nursing Facility  Patient/Family's Response to care:  Patient in agreement to going to SNF for short term rehab.  Patient/Family's Understanding of and Emotional Response to Diagnosis, Current Treatment, and Prognosis:  Patient is aware of diagnosis and current treatment plan  Emotional Assessment Appearance:  Appears stated age Attitude/Demeanor/Rapport:    Affect (typically observed):  Calm, Hopeful, Pleasant, Stable Orientation:  Oriented to Self, Oriented to Place, Oriented to  Time, Oriented to Situation Alcohol / Substance use:  Not Applicable Psych involvement (Current and /or in the community):  No (Comment)  Discharge Needs  Concerns to be addressed:  Lack of Support Readmission within the last 30 days:  No Current discharge risk:  Lives alone Barriers to Discharge:  No Barriers Identified   [place] Constable 11/07/2014, 9:54 PM

## 2014-11-07 NOTE — Progress Notes (Signed)
Subjective: 1 Day Post-Op Procedure(s) (LRB): LEFT ACHILLES TENDON LENGTHENING,  (Left) TALONAVICULAR CANCELLOUS CUBOID ARTHRODESIS WITH CORRECTIVE OSTEOTOMY  (Left)  Patient reports pain as mild to moderate.  Denies fever, chills, N/V.  In good spirits this am.  Tolerating POs well.  Denies BM, but admits to flatulence and voiding.  States that she was successfully up with therapy yesterday.  Objective:   VITALS:  Temp:  [97.1 F (36.2 C)-99.5 F (37.5 C)] 99.5 F (37.5 C) (10/21 0520) Pulse Rate:  [55-86] 86 (10/21 0520) Resp:  [7-20] 16 (10/21 0520) BP: (74-153)/(44-78) 100/56 mmHg (10/21 0520) SpO2:  [100 %] 100 % (10/21 0520) Weight:  [110.678 kg (244 lb)] 110.678 kg (244 lb) (10/20 2426)  General: WDWN patient in NAD. Psych:  Appropriate mood and affect. Neuro:  A&O x 3, Moving all extremities, sensation decreased to light touch at distal LLE. HEENT:  EOMs intact Chest:  Even non-labored respirations Skin:  Dressing and birdcage in place.  Dried blood on outer posterior aspect of dressing.  No evidence of active bleeding. Extremities: warm/dry, no visible edema, erythmea, or echymosis.   Pulses: Popliteus 2+ MSK:  ROM: EHL/FHL intact, MMT: patient can perform quad set    LABS No results for input(s): HGB, WBC, PLT in the last 72 hours. No results for input(s): NA, K, CL, CO2, BUN, CREATININE, GLUCOSE in the last 72 hours. No results for input(s): LABPT, INR in the last 72 hours.   Assessment/Plan: 1 Day Post-Op Procedure(s) (LRB): LEFT ACHILLES TENDON LENGTHENING,  (Left) TALONAVICULAR CANCELLOUS CUBOID ARTHRODESIS WITH CORRECTIVE OSTEOTOMY  (Left)  Up with therapy D/C IV fluids  NWB LLE Plan for discharge to SNF once bed becomes available.   [name], PA-C, [facility] [employer] Office:  [phone]

## 2014-11-07 NOTE — Discharge Instructions (Signed)
Sarah Hewitt, MD ° Orthopaedics ° °Please read the following information regarding your care after surgery. ° °Medications  °You only need a prescription for the narcotic pain medicine (ex. oxycodone, Percocet, Norco).  All of the other medicines listed below are available over the counter. °X acetominophen (Tylenol) 650 mg every 4-6 hours as you need for minor pain °X oxycodone as prescribed for moderate to severe pain °?  ° °Narcotic pain medicine (ex. oxycodone, Percocet, Vicodin) will cause constipation.  To prevent this problem, take the following medicines while you are taking any pain medicine. °X docusate sodium (Colace) 100 mg twice a day X senna (Senokot) 2 tablets twice a day ° °X To help prevent blood clots, take an aspirin (325 mg) once a day for a month after surgery.  You should also get up every hour while you are awake to move around.   ° °Weight Bearing °X Do not bear any weight on the operated leg or foot. ° °Cast / Splint / Dressing °X Keep your splint or cast clean and dry.  Don’t put anything (coat hanger, pencil, etc) down inside of it.  If it gets damp, use a hair dryer on the cool setting to dry it.  If it gets soaked, call the office to schedule an appointment for a cast change. °  ° °After your dressing, cast or splint is removed; you may shower, but do not soak or scrub the wound.  Allow the water to run over it, and then gently pat it dry. ° °Swelling °It is normal for you to have swelling where you had surgery.  To reduce swelling and pain, keep your toes above your nose for at least 3 days after surgery.  It may be necessary to keep your foot or leg elevated for several weeks.  If it hurts, it should be elevated. ° °Follow Up °Call my office at [phone] when you are discharged from the hospital or surgery center to schedule an appointment to be seen two weeks after surgery. ° °Call my office at [phone] if you develop a fever >101.5° F, nausea, vomiting, bleeding from  the surgical site or severe pain.   ° ° °

## 2014-11-07 NOTE — Evaluation (Signed)
Occupational Therapy Evaluation Patient Details Name: [name] MRN: [id number] DOB: [birth year] Today's Date: 11/07/2014    History of Present Illness 59 y.o. female s/p Percutaneous left achilles tendon lengthening, Left talonavicular and calcaneocuboid arthrodeses with corrective osteotomy, and Application of thin wire circular frame to left lower extremity. Hx of glaucoma, CKD, HTN, DMII, and normocytic anemia.   Clinical Impression   Patient is s/p L achilles tendon lengthening and L talonavicular and calcaneocuboid arthrodeses with corrective osteotomy surgery resulting in functional limitations due to the deficits listed below (see OT problem list). PTA independent. Patient will benefit from skilled OT acutely to increase independence and safety with ADLS to allow discharge SNF.     Follow Up Recommendations  SNF    Equipment Recommendations  Other (comment) (defer SNF)    Recommendations for Other Services       Precautions / Restrictions Precautions Precautions: Fall Required Braces or Orthoses: Other Brace/Splint Other Brace/Splint: external fixator on LLE Restrictions Weight Bearing Restrictions: Yes LLE Weight Bearing: Non weight bearing      Mobility Bed Mobility Overal bed mobility: Needs Assistance Bed Mobility: Sit to Supine     Supine to sit: Min guard;Min assist;HOB elevated Sit to supine: Mod assist;HOB elevated   General bed mobility comments: (A) with L LE  Transfers Overall transfer level: Needs assistance Equipment used: Rolling walker (2 wheeled) Transfers: Sit to/from Stand Sit to Stand: Min assist         General transfer comment: cues for safety with RW and positioning    Balance Overall balance assessment: Needs assistance Sitting-balance support: Bilateral upper extremity supported Sitting balance-Leahy Scale: Fair     Standing balance support: Bilateral upper extremity supported;During functional activity Standing  balance-Leahy Scale: Poor Standing balance comment: controlling better with BUE's                             ADL Overall ADL's : Needs assistance/impaired         Upper Body Bathing: Set up;Sitting   Lower Body Bathing: Moderate assistance;Sit to/from stand           Toilet Transfer: Minimal assistance;Stand-pivot;RW;BSC   Toileting- Clothing Manipulation and Hygiene: Minimal assistance;Sit to/from stand         General ADL Comments: Pt in chair and reports fatigue. Pt (A)ed with 3n1 transfer and then transfer to bed to allow time to rest     Vision     Perception     Praxis      Pertinent Vitals/Pain Pain Assessment: No/denies pain Pain Score: 4  Faces Pain Scale: Hurts little more Pain Location: L akle Pain Descriptors / Indicators: Operative site guarding Pain Intervention(s): Monitored during session;Premedicated before session;Repositioned     Hand Dominance Right   Extremity/Trunk Assessment Upper Extremity Assessment Upper Extremity Assessment: Overall WFL for tasks assessed   Lower Extremity Assessment Lower Extremity Assessment: Defer to PT evaluation   Cervical / Trunk Assessment Cervical / Trunk Assessment: Normal   Communication Communication Communication: No difficulties   Cognition Arousal/Alertness: Awake/alert (feels fatigued due to lack of sleep) Behavior During Therapy: WFL for tasks assessed/performed Overall Cognitive Status: Within Functional Limits for tasks assessed                     General Comments       Exercises       Shoulder Instructions      Home Living Family/patient expects  to be discharged to:: Skilled nursing facility                                        Prior Functioning/Environment Level of Independence: Independent with assistive device(s)        Comments: using cane    OT Diagnosis: Generalized weakness   OT Problem List: Decreased strength;Decreased  activity tolerance;Impaired balance (sitting and/or standing);Decreased cognition;Decreased safety awareness;Decreased knowledge of use of DME or AE;Decreased knowledge of precautions;Obesity;Pain   OT Treatment/Interventions: Self-care/ADL training;Therapeutic exercise;DME and/or AE instruction;Therapeutic activities;Patient/family education;Balance training    OT Goals(Current goals can be found in the care plan section) Acute Rehab OT Goals Patient Stated Goal: Go to rehab OT Goal Formulation: With patient Time For Goal Achievement: 11/21/14 Potential to Achieve Goals: Good  OT Frequency: Min 2X/week   Barriers to D/C:            Co-evaluation              End of Session Equipment Utilized During Treatment: Gait belt;Rolling walker Nurse Communication: Mobility status;Precautions  Activity Tolerance: Patient tolerated treatment well Patient left: in bed;with call bell/phone within reach   Time: 1208-1227 OT Time Calculation (min): 19 min Charges:  OT General Charges $OT Visit: 1 Procedure OT Evaluation $Initial OT Evaluation Tier I: 1 Procedure G-Codes:    Harolyn Rutherford Dec 03, 2014, 12:53 PM  Pager: [phone]

## 2014-11-07 NOTE — Clinical Social Work Placement (Addendum)
   CLINICAL SOCIAL WORK PLACEMENT  NOTE  Date:  11/07/2014  Patient Details  Name: [name] MRN: [id number] Date of Birth: 1955/02/07  Clinical Social Work is seeking post-discharge placement for this patient at the Skilled  Nursing Facility level of care (*CSW will initial, date and re-position this form in  chart as items are completed):  Yes   Patient/family provided with [facility] Clinical Social Work Department's list of facilities offering this level of care within the geographic area requested by the patient (or if unable, by the patient's family).  Yes   Patient/family informed of their freedom to choose among providers that offer the needed level of care, that participate in Medicare, Medicaid or managed care program needed by the patient, have an available bed and are willing to accept the patient.  Yes   Patient/family informed of [facility]'s ownership interest in [facility] and [facility], as well as of the fact that they are under no obligation to receive care at these facilities.  PASRR submitted to EDS on 11/07/14     PASRR number received on 11/07/14     Existing PASRR number confirmed on       FL2 transmitted to all facilities in geographic area requested by pt/family on 11/07/14     FL2 transmitted to all facilities within larger geographic area on       Patient informed that his/her managed care company has contracts with or will negotiate with certain facilities, including the following:         11/09/14   Patient/family informed of bed offers received.  Patient chooses bed at  [facility]     Physician recommends and patient chooses bed at      Patient to be transferred to  [facility] on  11/10/14.  Patient to be transferred to facility by  [facility]     Patient family notified on  11/10/14 of transfer.  Name of family member notified:   Patient notified her family.     PHYSICIAN Please sign FL2     Additional Comment:     _______________________________________________ Darleene Cleaver, LCSWA 11/07/2014, 10:04 PM

## 2014-11-08 LAB — GLUCOSE, CAPILLARY
GLUCOSE-CAPILLARY: 100 mg/dL — AB (ref 65–99)
GLUCOSE-CAPILLARY: 96 mg/dL (ref 65–99)
Glucose-Capillary: 137 mg/dL — ABNORMAL HIGH (ref 65–99)
Glucose-Capillary: 107 mg/dL — ABNORMAL HIGH (ref 65–99)

## 2014-11-08 MED ORDER — HYDROMORPHONE HCL 2 MG PO TABS
4.0000 mg | ORAL_TABLET | ORAL | Status: DC | PRN
  Administered 2014-11-08 – 2014-11-10 (×4): 4 mg via ORAL
  Filled 2014-11-08: qty 2
  Filled 2014-11-08: qty 4
  Filled 2014-11-08 (×3): qty 2

## 2014-11-08 MED ORDER — HYDROMORPHONE HCL 2 MG PO TABS: 2.0000 mg | ORAL_TABLET | ORAL | Status: DC | PRN

## 2014-11-08 NOTE — Progress Notes (Signed)
Subjective: 2 Days Post-Op Procedure(s) (LRB): LEFT ACHILLES TENDON LENGTHENING,  (Left) TALONAVICULAR CANCELLOUS CUBOID ARTHRODESIS WITH CORRECTIVE OSTEOTOMY  (Left)  Patient reports pain as mild to moderate.  Tolerating POs well.  Denies BM but admits to flatulence.  Denies fever, chills.  Has successfully been up with PT.  Objective:   VITALS:  Temp:  [98.9 F (37.2 C)-99.9 F (37.7 C)] 98.9 F (37.2 C) (10/22 0850) Pulse Rate:  [71-87] 77 (10/22 0850) Resp:  [16-20] 20 (10/22 0850) BP: (89-104)/(40-71) 89/50 mmHg (10/22 0850) SpO2:  [95 %-100 %] 95 % (10/22 0850)  General: WDWN patient in NAD. Psych:  Appropriate mood and affect. Neuro:  A&O x 3, Moving all extremities, sensation intact to light touch HEENT:  EOMs intact Chest:  Even non-labored respirations Skin:  Dressing C/D/I, no rashes or lesions Extremities: warm/dry, no visible edema, erythmea, or echymosis.  No lymphadenopathy. Pulses: Popliteus 2+ MSK:  ROM: EHL/FHL intact, MMT: patient can perform quad set    LABS No results for input(s): HGB, WBC, PLT in the last 72 hours. No results for input(s): NA, K, CL, CO2, BUN, CREATININE, GLUCOSE in the last 72 hours. No results for input(s): LABPT, INR in the last 72 hours.   Assessment/Plan: 2 Days Post-Op Procedure(s) (LRB): LEFT ACHILLES TENDON LENGTHENING,  (Left) TALONAVICULAR CANCELLOUS CUBOID ARTHRODESIS WITH CORRECTIVE OSTEOTOMY  (Left)  Up with therapy  NWB LEE Plan for D/C to SNF when bed available, likely Monday.  Prescriptions on chart.  AVS complete, I will complete D/C summary once date after D/C date is established.   [name], PA-C, ATC [employer] Office:  [phone]

## 2014-11-08 NOTE — Progress Notes (Signed)
LCSW attempted to see patient 2x on Saturday to discuss bed offers and plans. Patient soundly sleeping and was not awoken.  Bed offers:  [facility], [facility], [facility], [facility], [facility], and [facility].  Will have Sunday weekend CSW follow up with bed choice to plan for DC to SNF early next week.  Deretha Emory, MSW Clinical Social Work: Emergency Room [phone]

## 2014-11-09 LAB — GLUCOSE, CAPILLARY
Glucose-Capillary: 104 mg/dL — ABNORMAL HIGH (ref 65–99)
Glucose-Capillary: 95 mg/dL (ref 65–99)
Glucose-Capillary: 92 mg/dL (ref 65–99)
Glucose-Capillary: 88 mg/dL (ref 65–99)

## 2014-11-09 NOTE — Care Management Note (Signed)
Case Management Note  Patient Details  Name: ADALAYA IRION MRN: [id number] Date of Birth: [birth year]  Subjective/Objective:                  s/p Percutaneous left achilles tendon lengthening, Left talonavicular and calcaneocuboid arthrodeses with corrective osteotomy, and Application of thin wire circular frame to left lower extremity.   Action/Plan: CM notes that patient is planning to d/c to SNF and SW following. CM will remain available for any further discharge planning needs should the plan for discharge change.   Expected Discharge Date:                  Expected Discharge Plan:  Skilled Nursing Facility  In-House Referral:  Clinical Social Work  Discharge planning Services  CM Consult  Post Acute Care Choice:    Choice offered to:     DME Arranged:    DME Agency:     HH Arranged:    HH Agency:     Status of Service:  In process, will continue to follow  Medicare Important Message Given:    Date Medicare IM Given:    Medicare IM give by:    Date Additional Medicare IM Given:    Additional Medicare Important Message give by:     If discussed at Long Length of Stay Meetings, dates discussed:    Additional Comments:  Darcel Smalling, RN 11/09/2014, 12:00 PM

## 2014-11-09 NOTE — Progress Notes (Signed)
Subjective: 3 Days Post-Op Procedure(s) (LRB): LEFT ACHILLES TENDON LENGTHENING,  (Left) TALONAVICULAR CANCELLOUS CUBOID ARTHRODESIS WITH CORRECTIVE OSTEOTOMY  (Left) Patient reports pain as 2 on 0-10 scale.   Very pleasant lady and comfortable. Objective: Vital signs in last 24 hours: Temp:  [97.5 F (36.4 C)-99.1 F (37.3 C)] 99.1 F (37.3 C) (10/23 0521) Pulse Rate:  [76-84] 76 (10/23 0521) Resp:  [17-18] 18 (10/23 0521) BP: (102-105)/(51-72) 105/54 mmHg (10/23 0521) SpO2:  [93 %-99 %] 99 % (10/23 0521)  Intake/Output from previous day: 10/22 0701 - 10/23 0700 In: 240 [P.O.:240] Out: -  Intake/Output this shift:    No results for input(s): HGB in the last 72 hours. No results for input(s): WBC, RBC, HCT, PLT in the last 72 hours. No results for input(s): NA, K, CL, CO2, BUN, CREATININE, GLUCOSE, CALCIUM in the last 72 hours. No results for input(s): LABPT, INR in the last 72 hours.  No cellulitis present Cage intact good cap refill in toes.  Assessment/Plan: 3 Days Post-Op Procedure(s) (LRB): LEFT ACHILLES TENDON LENGTHENING,  (Left) TALONAVICULAR CANCELLOUS CUBOID ARTHRODESIS WITH CORRECTIVE OSTEOTOMY  (Left) Discharge to SNF in am  [name] ANDREW 11/09/2014, 10:47 AM

## 2014-11-10 DIAGNOSIS — R278 Other lack of coordination: Secondary | ICD-10-CM | POA: Diagnosis not present

## 2014-11-10 DIAGNOSIS — E13610 Other specified diabetes mellitus with diabetic neuropathic arthropathy: Secondary | ICD-10-CM | POA: Diagnosis not present

## 2014-11-10 DIAGNOSIS — E785 Hyperlipidemia, unspecified: Secondary | ICD-10-CM | POA: Diagnosis not present

## 2014-11-10 DIAGNOSIS — M14672 Charcot's joint, left ankle and foot: Secondary | ICD-10-CM | POA: Diagnosis not present

## 2014-11-10 DIAGNOSIS — E11621 Type 2 diabetes mellitus with foot ulcer: Secondary | ICD-10-CM | POA: Diagnosis not present

## 2014-11-10 DIAGNOSIS — Z7984 Long term (current) use of oral hypoglycemic drugs: Secondary | ICD-10-CM | POA: Diagnosis not present

## 2014-11-10 DIAGNOSIS — H409 Unspecified glaucoma: Secondary | ICD-10-CM | POA: Diagnosis not present

## 2014-11-10 DIAGNOSIS — E11610 Type 2 diabetes mellitus with diabetic neuropathic arthropathy: Secondary | ICD-10-CM | POA: Diagnosis not present

## 2014-11-10 DIAGNOSIS — R2681 Unsteadiness on feet: Secondary | ICD-10-CM | POA: Diagnosis not present

## 2014-11-10 DIAGNOSIS — Z0000 Encounter for general adult medical examination without abnormal findings: Secondary | ICD-10-CM | POA: Diagnosis not present

## 2014-11-10 DIAGNOSIS — Z23 Encounter for immunization: Secondary | ICD-10-CM | POA: Diagnosis not present

## 2014-11-10 DIAGNOSIS — M069 Rheumatoid arthritis, unspecified: Secondary | ICD-10-CM | POA: Diagnosis not present

## 2014-11-10 DIAGNOSIS — E1122 Type 2 diabetes mellitus with diabetic chronic kidney disease: Secondary | ICD-10-CM | POA: Diagnosis not present

## 2014-11-10 DIAGNOSIS — Z4789 Encounter for other orthopedic aftercare: Secondary | ICD-10-CM | POA: Diagnosis not present

## 2014-11-10 DIAGNOSIS — M6281 Muscle weakness (generalized): Secondary | ICD-10-CM | POA: Diagnosis not present

## 2014-11-10 DIAGNOSIS — I10 Essential (primary) hypertension: Secondary | ICD-10-CM | POA: Diagnosis not present

## 2014-11-10 DIAGNOSIS — Z4689 Encounter for fitting and adjustment of other specified devices: Secondary | ICD-10-CM | POA: Diagnosis not present

## 2014-11-10 DIAGNOSIS — Z794 Long term (current) use of insulin: Secondary | ICD-10-CM | POA: Diagnosis not present

## 2014-11-10 DIAGNOSIS — K5900 Constipation, unspecified: Secondary | ICD-10-CM | POA: Diagnosis not present

## 2014-11-10 DIAGNOSIS — K5901 Slow transit constipation: Secondary | ICD-10-CM | POA: Diagnosis not present

## 2014-11-10 DIAGNOSIS — T8460XD Infection and inflammatory reaction due to internal fixation device of unspecified site, subsequent encounter: Secondary | ICD-10-CM | POA: Diagnosis not present

## 2014-11-10 DIAGNOSIS — Z87891 Personal history of nicotine dependence: Secondary | ICD-10-CM | POA: Diagnosis not present

## 2014-11-10 DIAGNOSIS — I129 Hypertensive chronic kidney disease with stage 1 through stage 4 chronic kidney disease, or unspecified chronic kidney disease: Secondary | ICD-10-CM | POA: Diagnosis not present

## 2014-11-10 DIAGNOSIS — K219 Gastro-esophageal reflux disease without esophagitis: Secondary | ICD-10-CM | POA: Diagnosis not present

## 2014-11-10 DIAGNOSIS — N183 Chronic kidney disease, stage 3 (moderate): Secondary | ICD-10-CM | POA: Diagnosis not present

## 2014-11-10 DIAGNOSIS — Z79899 Other long term (current) drug therapy: Secondary | ICD-10-CM | POA: Diagnosis not present

## 2014-11-10 DIAGNOSIS — G4733 Obstructive sleep apnea (adult) (pediatric): Secondary | ICD-10-CM | POA: Diagnosis not present

## 2014-11-10 DIAGNOSIS — M79672 Pain in left foot: Secondary | ICD-10-CM | POA: Diagnosis not present

## 2014-11-10 DIAGNOSIS — R262 Difficulty in walking, not elsewhere classified: Secondary | ICD-10-CM | POA: Diagnosis not present

## 2014-11-10 DIAGNOSIS — D62 Acute posthemorrhagic anemia: Secondary | ICD-10-CM | POA: Diagnosis not present

## 2014-11-10 DIAGNOSIS — Z981 Arthrodesis status: Secondary | ICD-10-CM | POA: Diagnosis not present

## 2014-11-10 DIAGNOSIS — M25572 Pain in left ankle and joints of left foot: Secondary | ICD-10-CM | POA: Diagnosis not present

## 2014-11-10 DIAGNOSIS — G4730 Sleep apnea, unspecified: Secondary | ICD-10-CM | POA: Diagnosis not present

## 2014-11-10 DIAGNOSIS — L97529 Non-pressure chronic ulcer of other part of left foot with unspecified severity: Secondary | ICD-10-CM | POA: Diagnosis not present

## 2014-11-10 DIAGNOSIS — Z8614 Personal history of Methicillin resistant Staphylococcus aureus infection: Secondary | ICD-10-CM | POA: Diagnosis not present

## 2014-11-10 DIAGNOSIS — Z7982 Long term (current) use of aspirin: Secondary | ICD-10-CM | POA: Diagnosis not present

## 2014-11-10 DIAGNOSIS — Z13220 Encounter for screening for lipoid disorders: Secondary | ICD-10-CM | POA: Diagnosis not present

## 2014-11-10 DIAGNOSIS — M1990 Unspecified osteoarthritis, unspecified site: Secondary | ICD-10-CM | POA: Diagnosis not present

## 2014-11-10 DIAGNOSIS — Z472 Encounter for removal of internal fixation device: Secondary | ICD-10-CM | POA: Diagnosis not present

## 2014-11-10 DIAGNOSIS — E1140 Type 2 diabetes mellitus with diabetic neuropathy, unspecified: Secondary | ICD-10-CM | POA: Diagnosis not present

## 2014-11-10 DIAGNOSIS — L97521 Non-pressure chronic ulcer of other part of left foot limited to breakdown of skin: Secondary | ICD-10-CM | POA: Diagnosis not present

## 2014-11-10 DIAGNOSIS — E10610 Type 1 diabetes mellitus with diabetic neuropathic arthropathy: Secondary | ICD-10-CM | POA: Diagnosis not present

## 2014-11-10 DIAGNOSIS — E119 Type 2 diabetes mellitus without complications: Secondary | ICD-10-CM | POA: Diagnosis not present

## 2014-11-10 DIAGNOSIS — E7800 Pure hypercholesterolemia, unspecified: Secondary | ICD-10-CM | POA: Diagnosis not present

## 2014-11-10 DIAGNOSIS — L02612 Cutaneous abscess of left foot: Secondary | ICD-10-CM | POA: Diagnosis not present

## 2014-11-10 LAB — GLUCOSE, CAPILLARY
GLUCOSE-CAPILLARY: 91 mg/dL (ref 65–99)
Glucose-Capillary: 104 mg/dL — ABNORMAL HIGH (ref 65–99)
Glucose-Capillary: 103 mg/dL — ABNORMAL HIGH (ref 65–99)

## 2014-11-10 MED ORDER — MAGNESIUM CITRATE PO SOLN
1.0000 | Freq: Every day | ORAL | Status: DC | PRN
  Administered 2014-11-10: 1 via ORAL
  Filled 2014-11-10: qty 296

## 2014-11-10 NOTE — Progress Notes (Signed)
Subjective: 4 Days Post-Op Procedure(s) (LRB): LEFT ACHILLES TENDON LENGTHENING,  (Left) TALONAVICULAR CANCELLOUS CUBOID ARTHRODESIS WITH CORRECTIVE OSTEOTOMY  (Left)  Patient reports pain as mild to moderate. Tolerating POs well.  States that she is ready to go to SNF.  Denies fever, chills, N/V.   Objective:   VITALS:  Temp:  [98.4 F (36.9 C)-98.9 F (37.2 C)] 98.4 F (36.9 C) (10/24 0647) Pulse Rate:  [77-78] 77 (10/24 0647) Resp:  [16-18] 18 (10/24 0647) BP: (103-113)/(52-65) 113/56 mmHg (10/24 0647) SpO2:  [96 %-98 %] 97 % (10/24 0647)  General: WDWN patient in NAD. Psych:  Appropriate mood and affect. Neuro:  A&O x 3, Moving all extremities, sensation intact to light touch HEENT:  EOMs intact Chest:  Even non-labored respirations Skin:  Dressing and bird cage C/D/I, no rashes or lesions Extremities: warm/dry, no visible edema, erythema, or echymosis.  No lymphadenopathy. Pulses: Popliteus 2+ MSK:  ROM: EHL/FHL intact, MMT: patient can perform quad set    LABS No results for input(s): HGB, WBC, PLT in the last 72 hours. No results for input(s): NA, K, CL, CO2, BUN, CREATININE, GLUCOSE in the last 72 hours. No results for input(s): LABPT, INR in the last 72 hours.   Assessment/Plan: 4 Days Post-Op Procedure(s) (LRB): LEFT ACHILLES TENDON LENGTHENING,  (Left) TALONAVICULAR CANCELLOUS CUBOID ARTHRODESIS WITH CORRECTIVE OSTEOTOMY  (Left)  Plan for D/C to SNF today.  Awaiting official placement.  D/C orders have been placed and script is on the chart. Plan for f/u with [name] in the clinic in 2 weeks. NWB LLE  Sarah Martinez, PA-C, ATC [facility] Office:  [phone]

## 2014-11-10 NOTE — Clinical Social Work Note (Signed)
Patient to be d/c'ed today to [facility].  Patient and family agreeable to plans will transport via ems RN to call report.  Tammara Massing, MSW, LCSWA [phone]  

## 2014-11-10 NOTE — Progress Notes (Signed)
Physical Therapy Treatment Patient Details Name: MORAIMA BURD MRN: [id number] DOB: Sep 10, 1955 Today's Date: 2014-11-17    History of Present Illness 59 y.o. female s/p Percutaneous left achilles tendon lengthening, Left talonavicular and calcaneocuboid arthrodeses with corrective osteotomy, and Application of thin wire circular frame to left lower extremity. Hx of glaucoma, CKD, HTN, DMII, and normocytic anemia.    PT Comments    Pt pleasant and moving well with increased gait today. Pt educated for bil LE HEP, able to perform transfers without physical assist and great adherence to NWB RLE throughout. Will continue to follow.   Follow Up Recommendations  SNF;Supervision for mobility/OOB     Equipment Recommendations       Recommendations for Other Services       Precautions / Restrictions Precautions Precautions: Fall Other Brace/Splint: external fixator on LLE Restrictions Weight Bearing Restrictions: Yes LLE Weight Bearing: Non weight bearing    Mobility  Bed Mobility Overal bed mobility: Modified Independent                Transfers Overall transfer level: Needs assistance   Transfers: Sit to/from Stand;Stand Pivot Transfers Sit to Stand: Supervision Stand pivot transfers: Supervision       General transfer comment: supervision for safety and setup for pivot to [facility]  Ambulation/Gait Ambulation/Gait assistance: Supervision Ambulation Distance (Feet): 30 Feet Assistive device: Rolling walker (2 wheeled) Gait Pattern/deviations: Step-to pattern   Gait velocity interpretation: Below normal speed for age/gender General Gait Details: cues for sequence, posture and improved hopping today with donning right shoe prior to gait   Stairs            Wheelchair Mobility    Modified Rankin (Stroke Patients Only)       Balance                                    Cognition Arousal/Alertness: Awake/alert Behavior During Therapy:  WFL for tasks assessed/performed Overall Cognitive Status: Within Functional Limits for tasks assessed                      Exercises General Exercises - Lower Extremity Long Arc Quad: AROM;Seated;Both;15 reps Hip ABduction/ADduction: AROM;Seated;Both;15 reps Straight Leg Raises: AROM;Seated;Both;10 reps Hip Flexion/Marching: AROM;Seated;Right;15 reps    General Comments        Pertinent Vitals/Pain Pain Assessment: No/denies pain    Home Living                      Prior Function            PT Goals (current goals can now be found in the care plan section) Progress towards PT goals: Progressing toward goals    Frequency       PT Plan Current plan remains appropriate    Co-evaluation             End of Session   Activity Tolerance: Patient tolerated treatment well Patient left: in chair;with call bell/phone within reach     Time: 7824-2353 PT Time Calculation (min) (ACUTE ONLY): 23 min  Charges:  $Gait Training: 8-22 mins $Therapeutic Activity: 8-22 mins                    G Codes:      Delorse Lek Nov 17, 2014, 10:47 AM Delaney Meigs, PT [phone]

## 2014-11-10 NOTE — Care Management Important Message (Signed)
Important Message  Patient Details  Name: [name] MRN: [id number] Date of Birth: December 20, 1955   Medicare Important Message Given:  Yes-second notification given    Orson Aloe 11/10/2014, 12:22 PM

## 2014-11-10 NOTE — Discharge Summary (Signed)
Physician Discharge Summary  Patient ID: [name] MRN: [id number] DOB/AGE: [birth year] 59 y.o.  Admit date: 11/06/2014 Discharge date: 11/10/2014  Admission Diagnoses: Charcot foot due to DM of left foot; CKD stage 3; DM type 2; HTN; normocytic anemia; diabetic ulcer of left foot; sleep apnea; GERD, back pain  Discharge Diagnoses:  Active Problems:   Charcot foot due to diabetes mellitus ([place]) same as above  Discharged Condition: stable  Hospital Course: Patient presented to the [facility] on 11/06/2014 for elective left talonavicular and calcaneocuboid arthrodesis with corrective osteotomy, heel cord lengthening and application of thin wire circular frame due to charcot foot deformity performed by Dr. [name] Simmer.  The patient tolerated the procedure well without complication.  She was then admitted to the hospital. She tolerated her stay well without complication.  PT/OT, social work, were consulted.  She will be discharged on 11/10/2014 to a SNF.  Consults: PT/OT, social work  Significant Diagnostic Studies: radiology: X-Ray: utilized during operative procedure to ensure appropriate alignment  Treatments: IV hydration, antibiotics: Ancef, analgesia: acetaminophen, dilaudid, robaxin, cardiac meds: furosemide, anticoagulation: lovenox and surgery: as stated above  Discharge Exam: Blood pressure 113/56, pulse 77, temperature 98.4 F (36.9 C), temperature source Oral, resp. rate 18, height 5' 9.5" (1.765 m), weight 110.678 kg (244 lb), SpO2 97 %. General: WDWN patient in NAD. Psych:  Appropriate mood and affect. Neuro:  A&O x 3, Moving all extremities, sensation intact to light touch HEENT:  EOMs intact Chest:  Even non-labored respirations Skin:  Dressing and birdcage C/D/I, no rashes or lesions Extremities: warm/dry, no visible edema, erythmea, or echymosis.  No lymphadenopathy. Pulses: Poplitues 2+ MSK:  ROM: EHL/FHL intact, MMT: patient can perform quad  set   Disposition: 01-Home or Self Care  Discharge Instructions    Call MD / Call 911    Complete by:  As directed   If you experience chest pain or shortness of breath, CALL 911 and be transported to the hospital emergency room.  If you develope a fever above 101 F, pus (white drainage) or increased drainage or redness at the wound, or calf pain, call your surgeon's office.     Constipation Prevention    Complete by:  As directed   Drink plenty of fluids.  Prune juice may be helpful.  You may use a stool softener, such as Colace (over the counter) 100 mg twice a day.  Use MiraLax (over the counter) for constipation as needed.     Diet - low sodium heart healthy    Complete by:  As directed      Increase activity slowly as tolerated    Complete by:  As directed      Non weight bearing    Complete by:  As directed   Laterality:  left  Extremity:  Lower            Medication List    STOP taking these medications        aspirin EC 81 MG tablet      TAKE these medications        ACCU-CHEK AVIVA PLUS W/DEVICE Kit  Use to Check Blood Sugars Daily. Dx Code: E11.9.     acetaminophen 325 MG tablet  Commonly known as:  TYLENOL  Take 1 tablet (325 mg total) by mouth every 6 (six) hours as needed. For back pain.     atorvastatin 40 MG tablet  Commonly known as:  LIPITOR  Take 1 tablet (40 mg total)  by mouth daily.     Biotin 1000 MCG tablet  Take 1,000 mcg by mouth daily.     Calcium Carbonate-Vitamin D 600-400 MG-UNIT chew tablet  Chew 1 tablet by mouth 2 (two) times daily.     COMBIGAN 0.2-0.5 % ophthalmic solution  Generic drug:  brimonidine-timolol  Place 1 drop into both eyes 2 (two) times daily.     ferrous sulfate 325 (65 FE) MG tablet  Take 1 tablet (325 mg total) by mouth daily. With orange juice     furosemide 40 MG tablet  Commonly known as:  LASIX  TAKE 1 TABLET (40 MG TOTAL) BY MOUTH DAILY.     glucose blood test strip  Commonly known as:  ACCU-CHEK  AVIVA PLUS  Use to check blood sugar 1 time daily. diag code E11.9. Non insulin dependent     glucose blood test strip  Commonly known as:  ACCU-CHEK AVIVA PLUS  Use to check blood sugar 1 time daily. Dx code E11.9. Non insulin dependent     HYDROmorphone 2 MG tablet  Commonly known as:  DILAUDID  Take 1-2 tablets (2-4 mg total) by mouth every 4 (four) hours as needed for severe pain.     lisinopril 20 MG tablet  Commonly known as:  PRINIVIL,ZESTRIL  TAKE 1 TABLET (20 MG TOTAL) BY MOUTH DAILY.     LUMIGAN 0.01 % Soln  Generic drug:  bimatoprost  Place 1 drop into both eyes every evening.     metFORMIN 1000 MG tablet  Commonly known as:  GLUCOPHAGE  Take 0.5 tablets (500 mg total) by mouth 2 (two) times daily with a meal.     multivitamin tablet  Take 1 tablet by mouth daily.     pantoprazole 40 MG tablet  Commonly known as:  PROTONIX  TAKE 1 TABLET BY MOUTH DAILY     sitaGLIPtin 100 MG tablet  Commonly known as:  JANUVIA  Take 1 tablet (100 mg total) by mouth daily.     vitamin C 500 MG tablet  Commonly known as:  ASCORBIC ACID  Take 500 mg by mouth daily.     Zinc 50 MG Caps  Take 1 capsule by mouth daily.           Follow-up Information    Follow up with HEWITT, Jenny Reichmann, MD. Schedule an appointment as soon as possible for a visit in 2 weeks.   Specialty:  Orthopedic Surgery   Contact information:   [address] [place] [address] [phone]       Signed: [name], Hershal Coria, [facility] Office:  [phone]

## 2014-11-10 NOTE — Discharge Planning (Signed)
IV removed. Report called to [facility] at 1450. Most of patient's belongings sent with family, one bag sent with patient on the stretcher. Transported to [facility] via EMS.

## 2014-11-11 ENCOUNTER — Non-Acute Institutional Stay (SKILLED_NURSING_FACILITY): Payer: Medicare Other | Admitting: Adult Health

## 2014-11-11 DIAGNOSIS — M25572 Pain in left ankle and joints of left foot: Secondary | ICD-10-CM | POA: Diagnosis not present

## 2014-11-11 DIAGNOSIS — N183 Chronic kidney disease, stage 3 unspecified: Secondary | ICD-10-CM

## 2014-11-11 DIAGNOSIS — K219 Gastro-esophageal reflux disease without esophagitis: Secondary | ICD-10-CM | POA: Diagnosis not present

## 2014-11-11 DIAGNOSIS — R262 Difficulty in walking, not elsewhere classified: Secondary | ICD-10-CM | POA: Diagnosis not present

## 2014-11-11 DIAGNOSIS — D62 Acute posthemorrhagic anemia: Secondary | ICD-10-CM

## 2014-11-11 DIAGNOSIS — R2681 Unsteadiness on feet: Secondary | ICD-10-CM

## 2014-11-11 DIAGNOSIS — E1122 Type 2 diabetes mellitus with diabetic chronic kidney disease: Secondary | ICD-10-CM

## 2014-11-11 DIAGNOSIS — I10 Essential (primary) hypertension: Secondary | ICD-10-CM

## 2014-11-11 DIAGNOSIS — E785 Hyperlipidemia, unspecified: Secondary | ICD-10-CM

## 2014-11-11 DIAGNOSIS — E11610 Type 2 diabetes mellitus with diabetic neuropathic arthropathy: Secondary | ICD-10-CM

## 2014-11-11 DIAGNOSIS — Z981 Arthrodesis status: Secondary | ICD-10-CM | POA: Diagnosis not present

## 2014-11-11 DIAGNOSIS — M6281 Muscle weakness (generalized): Secondary | ICD-10-CM | POA: Diagnosis not present

## 2014-11-12 ENCOUNTER — Non-Acute Institutional Stay (SKILLED_NURSING_FACILITY): Payer: Medicare Other | Admitting: Internal Medicine

## 2014-11-12 ENCOUNTER — Encounter: Payer: Self-pay | Admitting: Adult Health

## 2014-11-12 DIAGNOSIS — R2681 Unsteadiness on feet: Secondary | ICD-10-CM

## 2014-11-12 DIAGNOSIS — K5901 Slow transit constipation: Secondary | ICD-10-CM | POA: Diagnosis not present

## 2014-11-12 DIAGNOSIS — K219 Gastro-esophageal reflux disease without esophagitis: Secondary | ICD-10-CM | POA: Diagnosis not present

## 2014-11-12 DIAGNOSIS — D62 Acute posthemorrhagic anemia: Secondary | ICD-10-CM

## 2014-11-12 DIAGNOSIS — G4733 Obstructive sleep apnea (adult) (pediatric): Secondary | ICD-10-CM | POA: Diagnosis not present

## 2014-11-12 DIAGNOSIS — I10 Essential (primary) hypertension: Secondary | ICD-10-CM | POA: Diagnosis not present

## 2014-11-12 DIAGNOSIS — N183 Chronic kidney disease, stage 3 unspecified: Secondary | ICD-10-CM

## 2014-11-12 DIAGNOSIS — E11610 Type 2 diabetes mellitus with diabetic neuropathic arthropathy: Secondary | ICD-10-CM

## 2014-11-12 DIAGNOSIS — E1169 Type 2 diabetes mellitus with other specified complication: Secondary | ICD-10-CM | POA: Insufficient documentation

## 2014-11-12 DIAGNOSIS — E1122 Type 2 diabetes mellitus with diabetic chronic kidney disease: Secondary | ICD-10-CM | POA: Insufficient documentation

## 2014-11-12 DIAGNOSIS — E785 Hyperlipidemia, unspecified: Secondary | ICD-10-CM | POA: Diagnosis not present

## 2014-11-12 LAB — BASIC METABOLIC PANEL
BUN: 31 mg/dL — AB (ref 4–21)
Creatinine: 1.7 mg/dL — AB (ref 0.5–1.1)
GLUCOSE: 105 mg/dL
Potassium: 4.6 mmol/L (ref 3.4–5.3)
Sodium: 138 mmol/L (ref 137–147)

## 2014-11-12 NOTE — Progress Notes (Signed)
Patient ID: NATHIFA RITTHALER, female   DOB: [birth year], 59 y.o.   MRN: [id number]       [facility]  PCP: Jacques Earthly, MD  Code Status: Full Code  Allergies  Allergen Reactions  . Codeine     REACTION: swelling in throat and itching  . Pravastatin Other (See Comments)    cramps    Chief Complaint  Patient presents with  . New Admit To SNF    New Admission     HPI:  59 y.o. 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 She underwent elective left talonavicular and calcaneocuboid arthrodesis with corrective osteotomy, heel cord lengthening and application of thin wire circular frame due to charcot foot deformity performed by Dr. [name] Simmer. she is seen in her room today. She denies any concerns this visit. No new concern from staff. She has been working with therapy team   Review of Systems:  Constitutional: Negative for fever, chills, diaphoresis.  HENT: Negative for headache, congestion, nasal discharge Eyes: Negative for eye pain, blurred vision, double vision and discharge.  Respiratory: Negative for cough, shortness of breath and wheezing.  provides hx of OSA and using cpap at home and o2 at hospital Cardiovascular: Negative for chest pain, palpitations, leg swelling.  Gastrointestinal: Negative for heartburn, nausea, vomiting, abdominal pain. Had bowel movement 2 days back and at home had one daily Genitourinary: Negative for dysuria, flank pain.  Musculoskeletal: Negative for back pain, falls. Skin: Negative for itching, rash.  Neurological: Negative for dizziness, tingling, focal weakness Psychiatric/Behavioral: Negative for depression   Past Medical History  Diagnosis Date  . Normocytic anemia   . Hypertension   . Foot ulcer, left ([place])   . Leg edema   . Glaucoma   . GERD (gastroesophageal reflux disease)   . High cholesterol   . Type II diabetes mellitus  ([place])   . OSA on CPAP   . H/O hiatal hernia   . Arthritis     "right leg" (01/30/2013)  . CKD (chronic kidney disease), stage III     /notes 01/30/2013   Past Surgical History  Procedure Laterality Date  . Oophorectomy Bilateral 1975-1976  . Appendectomy  1975  . I&d extremity Left 2011    "ulcer on my foot got infected" (01/30/2013)  . Multiple tooth extractions Bilateral   . Achilles tendon lengthening Left 11/06/2014    Procedure: LEFT ACHILLES TENDON LENGTHENING, ;  Surgeon: [name] Simmer, MD;  Location: [facility];  Service: Orthopedics;  Laterality: Left;  . Foot arthrodesis Left 11/06/2014    Procedure: TALONAVICULAR CANCELLOUS CUBOID ARTHRODESIS WITH CORRECTIVE OSTEOTOMY ;  Surgeon: [name] Simmer, MD;  Location: [facility];  Service: Orthopedics;  Laterality: Left;   Social History:   reports that she quit smoking about 41 years ago. Her smoking use included Cigarettes. She has a 2 pack-year smoking history. She has never used smokeless tobacco. She reports that she does not drink alcohol or use illicit drugs.  Family History  Problem Relation Age of Onset  . Diabetes Mother   . Hypertension Mother   . Heart disease Father   . Stroke Father   . Diabetes Father   . Hypertension Father   . Gout Father   . Deep vein thrombosis Father     Medications:   Medication List       This list is accurate as of: 11/12/14 11:24 AM.  Always  use your most recent med list.               ACCU-CHEK AVIVA PLUS W/DEVICE Kit  Use to Check Blood Sugars Daily. Dx Code: E11.9.     acetaminophen 325 MG tablet  Commonly known as:  TYLENOL  Take 1 tablet (325 mg total) by mouth every 6 (six) hours as needed. For back pain.     atorvastatin 40 MG tablet  Commonly known as:  LIPITOR  Take 1 tablet (40 mg total) by mouth daily.     Biotin 1000 MCG tablet  Take 1,000 mcg by mouth daily.     Calcium Carbonate-Vitamin D 600-400 MG-UNIT chew tablet  Chew one by mouth twice daily     Calcium  Carbonate-Vitamin D 600-400 MG-UNIT chew tablet  Chew 1 tablet by mouth 2 (two) times daily.     COMBIGAN 0.2-0.5 % ophthalmic solution  Generic drug:  brimonidine-timolol  Place one drop to both eyes twice daily for glaucoma     ferrous sulfate 325 (65 FE) MG tablet  Take 1 tablet (325 mg total) by mouth daily. With orange juice     furosemide 40 MG tablet  Commonly known as:  LASIX  TAKE 1 TABLET (40 MG TOTAL) BY MOUTH DAILY.     glucose blood test strip  Commonly known as:  ACCU-CHEK AVIVA PLUS  Use to check blood sugar 1 time daily. diag code E11.9. Non insulin dependent     glucose blood test strip  Commonly known as:  ACCU-CHEK AVIVA PLUS  Use to check blood sugar 1 time daily. Dx code E11.9. Non insulin dependent     HYDROmorphone 2 MG tablet  Commonly known as:  DILAUDID  Take 1-2 tablets (2-4 mg total) by mouth every 4 (four) hours as needed for severe pain.     lisinopril 20 MG tablet  Commonly known as:  PRINIVIL,ZESTRIL  TAKE 1 TABLET (20 MG TOTAL) BY MOUTH DAILY.     LUMIGAN 0.01 % Soln  Generic drug:  bimatoprost  Place 1 drop into both eyes every evening.     metFORMIN 1000 MG tablet  Commonly known as:  GLUCOPHAGE  Take 0.5 tablets (500 mg total) by mouth 2 (two) times daily with a meal.     multivitamin tablet  Take 1 tablet by mouth daily.     pantoprazole 40 MG tablet  Commonly known as:  PROTONIX  TAKE 1 TABLET BY MOUTH DAILY     sitaGLIPtin 100 MG tablet  Commonly known as:  JANUVIA  Take 1 tablet (100 mg total) by mouth daily.     vitamin C 500 MG tablet  Commonly known as:  ASCORBIC ACID  Take 500 mg by mouth daily.     Zinc 50 MG Caps  Take 1 capsule by mouth daily.         Physical Exam: Filed Vitals:   11/12/14 1108  BP: 118/70  Pulse: 90  Temp: 97 F (36.1 C)  TempSrc: Oral  Resp: 18  Height: 5' 9.5" (1.765 m)  Weight: 251 lb 3.2 oz (113.944 kg)  SpO2: 98%    General- elderly female, obese, in no acute  distress Head- normocephalic, atraumatic Nose- normal nasal mucosa, no maxillary or frontal sinus tenderness, no nasal discharge Throat- moist mucus membrane  Eyes- PERRLA, EOMI, no pallor, no icterus, no discharge, normal conjunctiva, normal sclera Neck- no cervical lymphadenopathy Cardiovascular- normal s1,s2, no murmurs Respiratory- bilateral clear to auscultation, no wheeze, no rhonchi,  no crackles, no use of accessory muscles Abdomen- bowel sounds present, soft, non tender Musculoskeletal- able to move all 4 extremities, left foot in brace and has dressing in place, on wheelchair, can move her left toes and has good capillary refill Neurological- no focal deficit, alert and oriented to person, place and time Skin- warm and dry Psychiatry- normal mood and affect    Labs reviewed: Basic Metabolic Panel:  Recent Labs  10/29/14 1154  NA 138  K 5.0  CL 105  CO2 26  GLUCOSE 96  BUN 28*  CREATININE 1.67*  CALCIUM 9.4   Liver Function Tests: No results for input(s): AST, ALT, ALKPHOS, BILITOT, PROT, ALBUMIN in the last 8760 hours. No results for input(s): LIPASE, AMYLASE in the last 8760 hours. No results for input(s): AMMONIA in the last 8760 hours. CBC:  Recent Labs  10/29/14 1154  WBC 7.5  HGB 10.1*  HCT 31.5*  MCV 85.1  PLT 241   Cardiac Enzymes: No results for input(s): CKTOTAL, CKMB, CKMBINDEX, TROPONINI in the last 8760 hours. BNP: Invalid input(s): POCBNP CBG:  Recent Labs  11/10/14 0651 11/10/14 1110 11/10/14 1724  GLUCAP 104* 103* 91     Assessment/Plan  Unsteady gait From recent repair of charcot foot. Will have her work with physical therapy and occupational therapy team to help with gait training and muscle strengthening exercises.fall precautions. Skin care. Encourage to be out of bed.   Left charcot foot S/p surgical repair as above. Has f/u with orthopedics. Will have patient work with PT/OT as tolerated to regain strength and restore  function.  Fall precautions are in place. Continue tylenol 325 mg q6h prn pain and dilaudid 2 mg 1-2 tab q4h prn pain with ca -vit d supplement. NWB to LLE. Not on any dvt prophylaxis. Start lovenox 40 mg sq daily for now and have clarification from orthopedics office on continuation of dvt prophylaxis.                                 Blood loss anemia Continue ferrous sulfate 325 mg daily and monitor h&h  Constipation Add senna s 2 tab qhs and reassess, hydration encouraged  OSA advised to get her cpap machine from home, for now have her on o2 by nasal canula @ 2l/min x 3 days until her machine arrives  Dm type 2 Continue to monitor cbg and metformin 500 mg bid with januvia 100 mg daily. Continue statin and ACEI  ckd stage 3 Monitor renal function with her on metformin- her home regimen  HTN Stable bp, continue lisinopril 20 mg daily, lasix 40 mg daily and monitor bp, check bmp  gerd Continue protonix 40 mg daily, symptoms controlled at present  Hyperlipidemia Continue lipitor 40 mg daily    Goals of care: short term rehabilitation   Labs/tests ordered: cbc, cmp  Family/ staff Communication: reviewed care plan with patient and nursing supervisor    Blanchie Serve, MD  [place] [phone] (Monday-Friday 8 am - 5 pm) [phone] (afterhours)

## 2014-11-13 DIAGNOSIS — E10610 Type 1 diabetes mellitus with diabetic neuropathic arthropathy: Secondary | ICD-10-CM | POA: Diagnosis not present

## 2014-11-13 DIAGNOSIS — Z981 Arthrodesis status: Secondary | ICD-10-CM | POA: Diagnosis not present

## 2014-11-13 DIAGNOSIS — M6281 Muscle weakness (generalized): Secondary | ICD-10-CM | POA: Diagnosis not present

## 2014-11-13 DIAGNOSIS — M25572 Pain in left ankle and joints of left foot: Secondary | ICD-10-CM | POA: Diagnosis not present

## 2014-11-13 DIAGNOSIS — R262 Difficulty in walking, not elsewhere classified: Secondary | ICD-10-CM | POA: Diagnosis not present

## 2014-11-14 ENCOUNTER — Other Ambulatory Visit: Payer: Self-pay | Admitting: Pulmonary Disease

## 2014-11-17 DIAGNOSIS — M6281 Muscle weakness (generalized): Secondary | ICD-10-CM | POA: Diagnosis not present

## 2014-11-17 DIAGNOSIS — R262 Difficulty in walking, not elsewhere classified: Secondary | ICD-10-CM | POA: Diagnosis not present

## 2014-11-17 DIAGNOSIS — M25572 Pain in left ankle and joints of left foot: Secondary | ICD-10-CM | POA: Diagnosis not present

## 2014-11-17 DIAGNOSIS — R2681 Unsteadiness on feet: Secondary | ICD-10-CM | POA: Diagnosis not present

## 2014-11-17 DIAGNOSIS — Z981 Arthrodesis status: Secondary | ICD-10-CM | POA: Diagnosis not present

## 2014-11-19 DIAGNOSIS — M25572 Pain in left ankle and joints of left foot: Secondary | ICD-10-CM | POA: Diagnosis not present

## 2014-11-19 DIAGNOSIS — E10610 Type 1 diabetes mellitus with diabetic neuropathic arthropathy: Secondary | ICD-10-CM | POA: Diagnosis not present

## 2014-11-19 DIAGNOSIS — M6281 Muscle weakness (generalized): Secondary | ICD-10-CM | POA: Diagnosis not present

## 2014-11-19 DIAGNOSIS — Z981 Arthrodesis status: Secondary | ICD-10-CM | POA: Diagnosis not present

## 2014-11-19 DIAGNOSIS — R262 Difficulty in walking, not elsewhere classified: Secondary | ICD-10-CM | POA: Diagnosis not present

## 2014-11-20 DIAGNOSIS — Z4789 Encounter for other orthopedic aftercare: Secondary | ICD-10-CM | POA: Diagnosis not present

## 2014-11-21 DIAGNOSIS — M25572 Pain in left ankle and joints of left foot: Secondary | ICD-10-CM | POA: Diagnosis not present

## 2014-11-21 DIAGNOSIS — R2681 Unsteadiness on feet: Secondary | ICD-10-CM | POA: Diagnosis not present

## 2014-11-21 DIAGNOSIS — M6281 Muscle weakness (generalized): Secondary | ICD-10-CM | POA: Diagnosis not present

## 2014-11-21 DIAGNOSIS — R262 Difficulty in walking, not elsewhere classified: Secondary | ICD-10-CM | POA: Diagnosis not present

## 2014-11-21 DIAGNOSIS — Z981 Arthrodesis status: Secondary | ICD-10-CM | POA: Diagnosis not present

## 2014-11-21 LAB — BASIC METABOLIC PANEL
BUN: 24 mg/dL — AB (ref 4–21)
Creatinine: 1.7 mg/dL — AB (ref 0.5–1.1)
GLUCOSE: 107 mg/dL
Potassium: 4.5 mmol/L (ref 3.4–5.3)
Sodium: 140 mmol/L (ref 137–147)

## 2014-11-24 DIAGNOSIS — M25572 Pain in left ankle and joints of left foot: Secondary | ICD-10-CM | POA: Diagnosis not present

## 2014-11-24 DIAGNOSIS — M6281 Muscle weakness (generalized): Secondary | ICD-10-CM | POA: Diagnosis not present

## 2014-11-24 DIAGNOSIS — Z981 Arthrodesis status: Secondary | ICD-10-CM | POA: Diagnosis not present

## 2014-11-24 DIAGNOSIS — R262 Difficulty in walking, not elsewhere classified: Secondary | ICD-10-CM | POA: Diagnosis not present

## 2014-11-24 DIAGNOSIS — R2681 Unsteadiness on feet: Secondary | ICD-10-CM | POA: Diagnosis not present

## 2014-11-26 DIAGNOSIS — Z981 Arthrodesis status: Secondary | ICD-10-CM | POA: Diagnosis not present

## 2014-11-26 DIAGNOSIS — M25572 Pain in left ankle and joints of left foot: Secondary | ICD-10-CM | POA: Diagnosis not present

## 2014-11-26 DIAGNOSIS — M6281 Muscle weakness (generalized): Secondary | ICD-10-CM | POA: Diagnosis not present

## 2014-11-26 DIAGNOSIS — R2681 Unsteadiness on feet: Secondary | ICD-10-CM | POA: Diagnosis not present

## 2014-11-26 DIAGNOSIS — R262 Difficulty in walking, not elsewhere classified: Secondary | ICD-10-CM | POA: Diagnosis not present

## 2014-11-28 DIAGNOSIS — R262 Difficulty in walking, not elsewhere classified: Secondary | ICD-10-CM | POA: Diagnosis not present

## 2014-11-28 DIAGNOSIS — Z981 Arthrodesis status: Secondary | ICD-10-CM | POA: Diagnosis not present

## 2014-11-28 DIAGNOSIS — M25572 Pain in left ankle and joints of left foot: Secondary | ICD-10-CM | POA: Diagnosis not present

## 2014-11-28 DIAGNOSIS — M6281 Muscle weakness (generalized): Secondary | ICD-10-CM | POA: Diagnosis not present

## 2014-11-28 DIAGNOSIS — R2681 Unsteadiness on feet: Secondary | ICD-10-CM | POA: Diagnosis not present

## 2014-12-01 ENCOUNTER — Encounter (HOSPITAL_BASED_OUTPATIENT_CLINIC_OR_DEPARTMENT_OTHER): Payer: Medicare Other | Attending: Plastic Surgery

## 2014-12-01 DIAGNOSIS — E11621 Type 2 diabetes mellitus with foot ulcer: Secondary | ICD-10-CM | POA: Insufficient documentation

## 2014-12-01 DIAGNOSIS — L97529 Non-pressure chronic ulcer of other part of left foot with unspecified severity: Secondary | ICD-10-CM | POA: Insufficient documentation

## 2014-12-01 DIAGNOSIS — E11610 Type 2 diabetes mellitus with diabetic neuropathic arthropathy: Secondary | ICD-10-CM | POA: Insufficient documentation

## 2014-12-01 DIAGNOSIS — E1140 Type 2 diabetes mellitus with diabetic neuropathy, unspecified: Secondary | ICD-10-CM | POA: Diagnosis not present

## 2014-12-02 DIAGNOSIS — Z981 Arthrodesis status: Secondary | ICD-10-CM | POA: Diagnosis not present

## 2014-12-02 DIAGNOSIS — M25572 Pain in left ankle and joints of left foot: Secondary | ICD-10-CM | POA: Diagnosis not present

## 2014-12-02 DIAGNOSIS — R2681 Unsteadiness on feet: Secondary | ICD-10-CM | POA: Diagnosis not present

## 2014-12-02 DIAGNOSIS — R262 Difficulty in walking, not elsewhere classified: Secondary | ICD-10-CM | POA: Diagnosis not present

## 2014-12-02 DIAGNOSIS — M6281 Muscle weakness (generalized): Secondary | ICD-10-CM | POA: Diagnosis not present

## 2014-12-04 DIAGNOSIS — M25572 Pain in left ankle and joints of left foot: Secondary | ICD-10-CM | POA: Diagnosis not present

## 2014-12-04 DIAGNOSIS — M6281 Muscle weakness (generalized): Secondary | ICD-10-CM | POA: Diagnosis not present

## 2014-12-04 DIAGNOSIS — R262 Difficulty in walking, not elsewhere classified: Secondary | ICD-10-CM | POA: Diagnosis not present

## 2014-12-04 DIAGNOSIS — R2681 Unsteadiness on feet: Secondary | ICD-10-CM | POA: Diagnosis not present

## 2014-12-04 DIAGNOSIS — Z981 Arthrodesis status: Secondary | ICD-10-CM | POA: Diagnosis not present

## 2014-12-05 DIAGNOSIS — Z4789 Encounter for other orthopedic aftercare: Secondary | ICD-10-CM | POA: Diagnosis not present

## 2014-12-09 DIAGNOSIS — R2681 Unsteadiness on feet: Secondary | ICD-10-CM | POA: Diagnosis not present

## 2014-12-09 DIAGNOSIS — R262 Difficulty in walking, not elsewhere classified: Secondary | ICD-10-CM | POA: Diagnosis not present

## 2014-12-09 DIAGNOSIS — M6281 Muscle weakness (generalized): Secondary | ICD-10-CM | POA: Diagnosis not present

## 2014-12-09 DIAGNOSIS — Z981 Arthrodesis status: Secondary | ICD-10-CM | POA: Diagnosis not present

## 2014-12-09 DIAGNOSIS — M25572 Pain in left ankle and joints of left foot: Secondary | ICD-10-CM | POA: Diagnosis not present

## 2014-12-15 DIAGNOSIS — R2681 Unsteadiness on feet: Secondary | ICD-10-CM | POA: Diagnosis not present

## 2014-12-15 DIAGNOSIS — M25572 Pain in left ankle and joints of left foot: Secondary | ICD-10-CM | POA: Diagnosis not present

## 2014-12-15 DIAGNOSIS — Z981 Arthrodesis status: Secondary | ICD-10-CM | POA: Diagnosis not present

## 2014-12-15 DIAGNOSIS — R262 Difficulty in walking, not elsewhere classified: Secondary | ICD-10-CM | POA: Diagnosis not present

## 2014-12-15 DIAGNOSIS — M6281 Muscle weakness (generalized): Secondary | ICD-10-CM | POA: Diagnosis not present

## 2014-12-16 DIAGNOSIS — Z981 Arthrodesis status: Secondary | ICD-10-CM | POA: Diagnosis not present

## 2014-12-16 DIAGNOSIS — M6281 Muscle weakness (generalized): Secondary | ICD-10-CM | POA: Diagnosis not present

## 2014-12-16 DIAGNOSIS — R262 Difficulty in walking, not elsewhere classified: Secondary | ICD-10-CM | POA: Diagnosis not present

## 2014-12-16 DIAGNOSIS — R2681 Unsteadiness on feet: Secondary | ICD-10-CM | POA: Diagnosis not present

## 2014-12-16 DIAGNOSIS — M25572 Pain in left ankle and joints of left foot: Secondary | ICD-10-CM | POA: Diagnosis not present

## 2014-12-16 DIAGNOSIS — L02612 Cutaneous abscess of left foot: Secondary | ICD-10-CM | POA: Diagnosis not present

## 2014-12-16 LAB — BASIC METABOLIC PANEL
BUN: 31 mg/dL — AB (ref 4–21)
CREATININE: 1.6 mg/dL — AB (ref 0.5–1.1)
Glucose: 121 mg/dL
Potassium: 4.3 mmol/L (ref 3.4–5.3)
Sodium: 135 mmol/L — AB (ref 137–147)

## 2014-12-18 DIAGNOSIS — M25572 Pain in left ankle and joints of left foot: Secondary | ICD-10-CM | POA: Diagnosis not present

## 2014-12-18 DIAGNOSIS — Z4789 Encounter for other orthopedic aftercare: Secondary | ICD-10-CM | POA: Diagnosis not present

## 2014-12-18 DIAGNOSIS — R262 Difficulty in walking, not elsewhere classified: Secondary | ICD-10-CM | POA: Diagnosis not present

## 2014-12-18 DIAGNOSIS — Z981 Arthrodesis status: Secondary | ICD-10-CM | POA: Diagnosis not present

## 2014-12-18 DIAGNOSIS — L02612 Cutaneous abscess of left foot: Secondary | ICD-10-CM | POA: Diagnosis not present

## 2014-12-18 DIAGNOSIS — R2681 Unsteadiness on feet: Secondary | ICD-10-CM | POA: Diagnosis not present

## 2014-12-18 DIAGNOSIS — M6281 Muscle weakness (generalized): Secondary | ICD-10-CM | POA: Diagnosis not present

## 2014-12-19 DIAGNOSIS — R2681 Unsteadiness on feet: Secondary | ICD-10-CM | POA: Diagnosis not present

## 2014-12-19 DIAGNOSIS — M25572 Pain in left ankle and joints of left foot: Secondary | ICD-10-CM | POA: Diagnosis not present

## 2014-12-19 DIAGNOSIS — L02612 Cutaneous abscess of left foot: Secondary | ICD-10-CM | POA: Diagnosis not present

## 2014-12-19 DIAGNOSIS — R262 Difficulty in walking, not elsewhere classified: Secondary | ICD-10-CM | POA: Diagnosis not present

## 2014-12-19 DIAGNOSIS — M6281 Muscle weakness (generalized): Secondary | ICD-10-CM | POA: Diagnosis not present

## 2014-12-19 DIAGNOSIS — Z981 Arthrodesis status: Secondary | ICD-10-CM | POA: Diagnosis not present

## 2014-12-22 DIAGNOSIS — M25572 Pain in left ankle and joints of left foot: Secondary | ICD-10-CM | POA: Diagnosis not present

## 2014-12-22 DIAGNOSIS — M6281 Muscle weakness (generalized): Secondary | ICD-10-CM | POA: Diagnosis not present

## 2014-12-22 DIAGNOSIS — Z4789 Encounter for other orthopedic aftercare: Secondary | ICD-10-CM | POA: Diagnosis not present

## 2014-12-22 DIAGNOSIS — E11610 Type 2 diabetes mellitus with diabetic neuropathic arthropathy: Secondary | ICD-10-CM | POA: Diagnosis not present

## 2014-12-22 DIAGNOSIS — T8460XD Infection and inflammatory reaction due to internal fixation device of unspecified site, subsequent encounter: Secondary | ICD-10-CM | POA: Diagnosis not present

## 2014-12-22 DIAGNOSIS — R2681 Unsteadiness on feet: Secondary | ICD-10-CM | POA: Diagnosis not present

## 2014-12-22 DIAGNOSIS — L02612 Cutaneous abscess of left foot: Secondary | ICD-10-CM | POA: Diagnosis not present

## 2014-12-22 DIAGNOSIS — Z981 Arthrodesis status: Secondary | ICD-10-CM | POA: Diagnosis not present

## 2014-12-22 DIAGNOSIS — R262 Difficulty in walking, not elsewhere classified: Secondary | ICD-10-CM | POA: Diagnosis not present

## 2014-12-23 DIAGNOSIS — M25572 Pain in left ankle and joints of left foot: Secondary | ICD-10-CM | POA: Diagnosis not present

## 2014-12-23 DIAGNOSIS — M6281 Muscle weakness (generalized): Secondary | ICD-10-CM | POA: Diagnosis not present

## 2014-12-23 DIAGNOSIS — L02612 Cutaneous abscess of left foot: Secondary | ICD-10-CM | POA: Diagnosis not present

## 2014-12-23 DIAGNOSIS — R262 Difficulty in walking, not elsewhere classified: Secondary | ICD-10-CM | POA: Diagnosis not present

## 2014-12-23 DIAGNOSIS — Z981 Arthrodesis status: Secondary | ICD-10-CM | POA: Diagnosis not present

## 2014-12-23 DIAGNOSIS — R2681 Unsteadiness on feet: Secondary | ICD-10-CM | POA: Diagnosis not present

## 2014-12-24 DIAGNOSIS — M6281 Muscle weakness (generalized): Secondary | ICD-10-CM | POA: Diagnosis not present

## 2014-12-24 DIAGNOSIS — Z981 Arthrodesis status: Secondary | ICD-10-CM | POA: Diagnosis not present

## 2014-12-24 DIAGNOSIS — M25572 Pain in left ankle and joints of left foot: Secondary | ICD-10-CM | POA: Diagnosis not present

## 2014-12-24 DIAGNOSIS — R2681 Unsteadiness on feet: Secondary | ICD-10-CM | POA: Diagnosis not present

## 2014-12-24 DIAGNOSIS — R262 Difficulty in walking, not elsewhere classified: Secondary | ICD-10-CM | POA: Diagnosis not present

## 2014-12-24 DIAGNOSIS — L02612 Cutaneous abscess of left foot: Secondary | ICD-10-CM | POA: Diagnosis not present

## 2014-12-29 DIAGNOSIS — T8460XD Infection and inflammatory reaction due to internal fixation device of unspecified site, subsequent encounter: Secondary | ICD-10-CM | POA: Diagnosis not present

## 2014-12-29 DIAGNOSIS — Z4789 Encounter for other orthopedic aftercare: Secondary | ICD-10-CM | POA: Diagnosis not present

## 2014-12-29 DIAGNOSIS — M6281 Muscle weakness (generalized): Secondary | ICD-10-CM | POA: Diagnosis not present

## 2014-12-29 DIAGNOSIS — M25572 Pain in left ankle and joints of left foot: Secondary | ICD-10-CM | POA: Diagnosis not present

## 2014-12-29 DIAGNOSIS — R262 Difficulty in walking, not elsewhere classified: Secondary | ICD-10-CM | POA: Diagnosis not present

## 2014-12-29 DIAGNOSIS — Z981 Arthrodesis status: Secondary | ICD-10-CM | POA: Diagnosis not present

## 2014-12-29 DIAGNOSIS — R2681 Unsteadiness on feet: Secondary | ICD-10-CM | POA: Diagnosis not present

## 2014-12-29 DIAGNOSIS — L02612 Cutaneous abscess of left foot: Secondary | ICD-10-CM | POA: Diagnosis not present

## 2014-12-31 DIAGNOSIS — R2681 Unsteadiness on feet: Secondary | ICD-10-CM | POA: Diagnosis not present

## 2014-12-31 DIAGNOSIS — Z981 Arthrodesis status: Secondary | ICD-10-CM | POA: Diagnosis not present

## 2014-12-31 DIAGNOSIS — L02612 Cutaneous abscess of left foot: Secondary | ICD-10-CM | POA: Diagnosis not present

## 2014-12-31 DIAGNOSIS — M6281 Muscle weakness (generalized): Secondary | ICD-10-CM | POA: Diagnosis not present

## 2014-12-31 DIAGNOSIS — M25572 Pain in left ankle and joints of left foot: Secondary | ICD-10-CM | POA: Diagnosis not present

## 2014-12-31 DIAGNOSIS — R262 Difficulty in walking, not elsewhere classified: Secondary | ICD-10-CM | POA: Diagnosis not present

## 2015-01-01 ENCOUNTER — Encounter: Payer: Self-pay | Admitting: Pulmonary Disease

## 2015-01-01 ENCOUNTER — Ambulatory Visit (INDEPENDENT_AMBULATORY_CARE_PROVIDER_SITE_OTHER): Payer: Medicare Other | Admitting: Pulmonary Disease

## 2015-01-01 VITALS — BP 128/70 | HR 77 | Temp 98.2°F | Ht 69.5 in | Wt 256.6 lb

## 2015-01-01 DIAGNOSIS — Z23 Encounter for immunization: Secondary | ICD-10-CM

## 2015-01-01 DIAGNOSIS — Z0000 Encounter for general adult medical examination without abnormal findings: Secondary | ICD-10-CM

## 2015-01-01 DIAGNOSIS — I10 Essential (primary) hypertension: Secondary | ICD-10-CM

## 2015-01-01 MED ORDER — LISINOPRIL 10 MG PO TABS: 10.0000 mg | ORAL_TABLET | Freq: Every day | ORAL | Status: DC

## 2015-01-01 NOTE — Progress Notes (Signed)
Subjective:    Patient ID: [name], female    DOB: 1955/05/28, 59 y.o.   MRN: [id number]  HPI [name] is a 59 year old woman with history of hypertension, OSA on CPAP, GERD, diabetes type 2, CKD stage III presenting for follow-up of her hypertension.  She underwent elective left talonavicular and calcaneocuboid arthrodesis with corrective osteotomy, heel cord lengthening and application of thin wire circular frame due to charcot foot deformity by Dr. [name] Simmer (Orthopedic Surgery) on 11/06/2014. She was discharged to [facility] for rehabilitation. She reports she is doing well. She feels that she is well taken [facility] at [facility]. She enjoys working with therapy there. She has no complaints today. She notes that her blood pressure has been in the 86P systolics at her facility. Denies symptoms - no lightheadedness or dizziness.   Review of Systems Constitutional: no fevers/chills Eyes: no vision changes Ears, nose, mouth, throat, and face: no cough Respiratory: no shortness of breath Cardiovascular: no chest pain Gastrointestinal: no nausea/vomiting, no abdominal pain, no constipation, no diarrhea Genitourinary: no dysuria, no hematuria Integument: no rash Hematologic/lymphatic: no bleeding/bruising, no edema Musculoskeletal: no arthralgias, no myalgias Neurological: no paresthesias, no weakness  Past Medical History  Diagnosis Date  . Normocytic anemia   . Hypertension   . Foot ulcer, left ([place])   . Leg edema   . Glaucoma   . GERD (gastroesophageal reflux disease)   . High cholesterol   . Type II diabetes mellitus ([place])   . OSA on CPAP   . H/O hiatal hernia   . Arthritis     "right leg" (01/30/2013)  . CKD (chronic kidney disease), stage III     /notes 01/30/2013    Current Outpatient Prescriptions on File Prior to Visit  Medication Sig Dispense Refill  . acetaminophen (TYLENOL) 325 MG tablet Take 1 tablet (325 mg total) by mouth every  6 (six) hours as needed. For back pain. 30 tablet   . atorvastatin (LIPITOR) 40 MG tablet Take 1 tablet (40 mg total) by mouth daily. 30 tablet 0  . Biotin 1000 MCG tablet Take 1,000 mcg by mouth daily.    . Blood Glucose Monitoring Suppl (ACCU-CHEK AVIVA PLUS) W/DEVICE KIT Use to Check Blood Sugars Daily. Dx Code: E11.9. 1 kit 0  . Calcium Carbonate-Vitamin D 600-400 MG-UNIT chew tablet Chew one by mouth twice daily    . Calcium Carbonate-Vitamin D 600-400 MG-UNIT per chew tablet Chew 1 tablet by mouth 2 (two) times daily. 30 tablet 11  . COMBIGAN 0.2-0.5 % ophthalmic solution Place one drop to both eyes twice daily for glaucoma    . ferrous sulfate 325 (65 FE) MG tablet Take 1 tablet (325 mg total) by mouth daily. With orange juice 30 tablet 0  . furosemide (LASIX) 40 MG tablet TAKE 1 TABLET BY MOUTH DAILY 30 tablet 1  . glucose blood (ACCU-CHEK AVIVA PLUS) test strip Use to check blood sugar 1 time daily. Dx code E11.9. Non insulin dependent 50 each 3  . glucose blood (ACCU-CHEK AVIVA PLUS) test strip Use to check blood sugar 1 time daily. diag code E11.9. Non insulin dependent 100 each 3  . HYDROmorphone (DILAUDID) 2 MG tablet Take 1-2 tablets (2-4 mg total) by mouth every 4 (four) hours as needed for severe pain. 30 tablet 0  . lisinopril (PRINIVIL,ZESTRIL) 20 MG tablet TAKE 1 TABLET (20 MG TOTAL) BY MOUTH DAILY. (Patient taking differently: Take one tablet by mouth once  daily for hypertension) 30 tablet 0  . LUMIGAN 0.01 % SOLN Place 1 drop into both eyes every evening.    . metFORMIN (GLUCOPHAGE) 1000 MG tablet Take 0.5 tablets (500 mg total) by mouth 2 (two) times daily with a meal. 180 tablet 3  . Multiple Vitamin (MULTIVITAMIN) tablet Take 1 tablet by mouth daily.     . pantoprazole (PROTONIX) 40 MG tablet TAKE 1 TABLET BY MOUTH DAILY (Patient taking differently: Take one tablet by mouth daily before meal for GERD) 90 tablet 4  . sitaGLIPtin (JANUVIA) 100 MG tablet Take 1 tablet (100 mg  total) by mouth daily. 90 tablet 3  . vitamin C (ASCORBIC ACID) 500 MG tablet Take 500 mg by mouth daily.    . Zinc 50 MG CAPS Take 1 capsule by mouth daily.    . [DISCONTINUED] pravastatin (PRAVACHOL) 20 MG tablet Take 1 tablet (20 mg total) by mouth daily. 30 tablet 6   No current facility-administered medications on file prior to visit.    Today's Vitals   01/01/15 1443  BP: 128/70  Pulse: 77  Temp: 98.2 F (36.8 C)  TempSrc: Oral  Height: 5' 9.5" (1.765 m)  Weight: 256 lb 9.6 oz (116.393 kg)  SpO2: 100%  PainSc: 0-No pain    Objective:   Physical Exam  Constitutional: She is oriented to person, place, and time. She appears well-developed and well-nourished. No distress.  HENT:  Head: Normocephalic and atraumatic.  Mouth/Throat: Oropharynx is clear and moist. No oropharyngeal exudate.  Eyes: EOM are normal. Pupils are equal, round, and reactive to light.  Neck: Neck supple. No thyromegaly present.  Cardiovascular: Normal rate and regular rhythm.   No murmur heard. Pulmonary/Chest: Breath sounds normal. She has no wheezes. She has no rales.  Abdominal: Soft. She exhibits no distension. There is no tenderness.  Musculoskeletal:  Left foot in thin wire circular frame  Neurological: She is alert and oriented to person, place, and time. Coordination normal.  Skin: Skin is warm and dry. No erythema.  Psychiatric: She has a normal mood and affect.   Assessment & Plan:  Please refer to problem based charting.

## 2015-01-01 NOTE — Patient Instructions (Signed)
It was a pleasure to see you today.  Please decrease your lisinopril from 20mg  daily to 10mg  daily.  You received the Prevnar (pneumococcal conjugate vaccine (13-valent)) today.

## 2015-01-02 ENCOUNTER — Telehealth: Payer: Self-pay | Admitting: Pulmonary Disease

## 2015-01-02 DIAGNOSIS — R262 Difficulty in walking, not elsewhere classified: Secondary | ICD-10-CM | POA: Diagnosis not present

## 2015-01-02 DIAGNOSIS — Z981 Arthrodesis status: Secondary | ICD-10-CM | POA: Diagnosis not present

## 2015-01-02 DIAGNOSIS — M6281 Muscle weakness (generalized): Secondary | ICD-10-CM | POA: Diagnosis not present

## 2015-01-02 DIAGNOSIS — M25572 Pain in left ankle and joints of left foot: Secondary | ICD-10-CM | POA: Diagnosis not present

## 2015-01-02 DIAGNOSIS — R2681 Unsteadiness on feet: Secondary | ICD-10-CM | POA: Diagnosis not present

## 2015-01-02 DIAGNOSIS — L02612 Cutaneous abscess of left foot: Secondary | ICD-10-CM | POA: Diagnosis not present

## 2015-01-02 NOTE — Telephone Encounter (Signed)
LOV FROM Dr. Shea Evans from 09/30/2014 being faxed

## 2015-01-03 NOTE — Assessment & Plan Note (Signed)
BP Readings from Last 3 Encounters:  01/01/15 128/70  11/12/14 118/70  11/11/14 118/70    Lab Results  Component Value Date   NA 138 10/29/2014   K 5.0 10/29/2014   CREATININE 1.67* 10/29/2014    Assessment: Blood pressure control: Controlled Progress toward BP goal:  At goal Comments: Some episodes of low normal BP. Suspect this is due to following a strict low sodium diet at her facility.  Plan: Medications:  Continue Lasix 40mg  daily. Decrease lisinopril from 20mg  daily to 10mg  daily.  -Follow up in 6 months

## 2015-01-03 NOTE — Assessment & Plan Note (Signed)
Prevnar 13 administered

## 2015-01-05 ENCOUNTER — Encounter (HOSPITAL_BASED_OUTPATIENT_CLINIC_OR_DEPARTMENT_OTHER): Payer: No Typology Code available for payment source | Attending: Plastic Surgery

## 2015-01-05 DIAGNOSIS — L02612 Cutaneous abscess of left foot: Secondary | ICD-10-CM | POA: Diagnosis not present

## 2015-01-05 DIAGNOSIS — E1140 Type 2 diabetes mellitus with diabetic neuropathy, unspecified: Secondary | ICD-10-CM | POA: Diagnosis not present

## 2015-01-05 DIAGNOSIS — G4730 Sleep apnea, unspecified: Secondary | ICD-10-CM | POA: Diagnosis not present

## 2015-01-05 DIAGNOSIS — M069 Rheumatoid arthritis, unspecified: Secondary | ICD-10-CM | POA: Insufficient documentation

## 2015-01-05 DIAGNOSIS — M6281 Muscle weakness (generalized): Secondary | ICD-10-CM | POA: Diagnosis not present

## 2015-01-05 DIAGNOSIS — E11621 Type 2 diabetes mellitus with foot ulcer: Secondary | ICD-10-CM | POA: Diagnosis not present

## 2015-01-05 DIAGNOSIS — M25572 Pain in left ankle and joints of left foot: Secondary | ICD-10-CM | POA: Diagnosis not present

## 2015-01-05 DIAGNOSIS — R2681 Unsteadiness on feet: Secondary | ICD-10-CM | POA: Diagnosis not present

## 2015-01-05 DIAGNOSIS — H409 Unspecified glaucoma: Secondary | ICD-10-CM | POA: Diagnosis not present

## 2015-01-05 DIAGNOSIS — R262 Difficulty in walking, not elsewhere classified: Secondary | ICD-10-CM | POA: Diagnosis not present

## 2015-01-05 DIAGNOSIS — M14672 Charcot's joint, left ankle and foot: Secondary | ICD-10-CM | POA: Insufficient documentation

## 2015-01-05 DIAGNOSIS — L97521 Non-pressure chronic ulcer of other part of left foot limited to breakdown of skin: Secondary | ICD-10-CM | POA: Insufficient documentation

## 2015-01-05 DIAGNOSIS — I10 Essential (primary) hypertension: Secondary | ICD-10-CM | POA: Insufficient documentation

## 2015-01-05 DIAGNOSIS — Z981 Arthrodesis status: Secondary | ICD-10-CM | POA: Diagnosis not present

## 2015-01-06 DIAGNOSIS — M25572 Pain in left ankle and joints of left foot: Secondary | ICD-10-CM | POA: Diagnosis not present

## 2015-01-06 DIAGNOSIS — Z981 Arthrodesis status: Secondary | ICD-10-CM | POA: Diagnosis not present

## 2015-01-06 DIAGNOSIS — L02612 Cutaneous abscess of left foot: Secondary | ICD-10-CM | POA: Diagnosis not present

## 2015-01-06 DIAGNOSIS — M6281 Muscle weakness (generalized): Secondary | ICD-10-CM | POA: Diagnosis not present

## 2015-01-06 DIAGNOSIS — R262 Difficulty in walking, not elsewhere classified: Secondary | ICD-10-CM | POA: Diagnosis not present

## 2015-01-06 DIAGNOSIS — R2681 Unsteadiness on feet: Secondary | ICD-10-CM | POA: Diagnosis not present

## 2015-01-06 NOTE — Progress Notes (Signed)
Internal Medicine Clinic Attending  Case discussed with Dr. Krall soon after the resident saw the patient.  We reviewed the resident's history and exam and pertinent patient test results.  I agree with the assessment, diagnosis, and plan of care documented in the resident's note. 

## 2015-01-07 DIAGNOSIS — M6281 Muscle weakness (generalized): Secondary | ICD-10-CM | POA: Diagnosis not present

## 2015-01-07 DIAGNOSIS — R2681 Unsteadiness on feet: Secondary | ICD-10-CM | POA: Diagnosis not present

## 2015-01-07 DIAGNOSIS — L02612 Cutaneous abscess of left foot: Secondary | ICD-10-CM | POA: Diagnosis not present

## 2015-01-07 DIAGNOSIS — M25572 Pain in left ankle and joints of left foot: Secondary | ICD-10-CM | POA: Diagnosis not present

## 2015-01-07 DIAGNOSIS — Z981 Arthrodesis status: Secondary | ICD-10-CM | POA: Diagnosis not present

## 2015-01-07 DIAGNOSIS — R262 Difficulty in walking, not elsewhere classified: Secondary | ICD-10-CM | POA: Diagnosis not present

## 2015-01-12 DIAGNOSIS — Z981 Arthrodesis status: Secondary | ICD-10-CM | POA: Diagnosis not present

## 2015-01-12 DIAGNOSIS — M6281 Muscle weakness (generalized): Secondary | ICD-10-CM | POA: Diagnosis not present

## 2015-01-12 DIAGNOSIS — R2681 Unsteadiness on feet: Secondary | ICD-10-CM | POA: Diagnosis not present

## 2015-01-12 DIAGNOSIS — L02612 Cutaneous abscess of left foot: Secondary | ICD-10-CM | POA: Diagnosis not present

## 2015-01-12 DIAGNOSIS — R262 Difficulty in walking, not elsewhere classified: Secondary | ICD-10-CM | POA: Diagnosis not present

## 2015-01-12 DIAGNOSIS — M25572 Pain in left ankle and joints of left foot: Secondary | ICD-10-CM | POA: Diagnosis not present

## 2015-01-13 DIAGNOSIS — L02612 Cutaneous abscess of left foot: Secondary | ICD-10-CM | POA: Diagnosis not present

## 2015-01-13 DIAGNOSIS — Z981 Arthrodesis status: Secondary | ICD-10-CM | POA: Diagnosis not present

## 2015-01-13 DIAGNOSIS — M6281 Muscle weakness (generalized): Secondary | ICD-10-CM | POA: Diagnosis not present

## 2015-01-13 DIAGNOSIS — R262 Difficulty in walking, not elsewhere classified: Secondary | ICD-10-CM | POA: Diagnosis not present

## 2015-01-13 DIAGNOSIS — R2681 Unsteadiness on feet: Secondary | ICD-10-CM | POA: Diagnosis not present

## 2015-01-13 DIAGNOSIS — M25572 Pain in left ankle and joints of left foot: Secondary | ICD-10-CM | POA: Diagnosis not present

## 2015-01-14 DIAGNOSIS — R2681 Unsteadiness on feet: Secondary | ICD-10-CM | POA: Diagnosis not present

## 2015-01-14 DIAGNOSIS — M25572 Pain in left ankle and joints of left foot: Secondary | ICD-10-CM | POA: Diagnosis not present

## 2015-01-14 DIAGNOSIS — Z981 Arthrodesis status: Secondary | ICD-10-CM | POA: Diagnosis not present

## 2015-01-14 DIAGNOSIS — L02612 Cutaneous abscess of left foot: Secondary | ICD-10-CM | POA: Diagnosis not present

## 2015-01-14 DIAGNOSIS — M6281 Muscle weakness (generalized): Secondary | ICD-10-CM | POA: Diagnosis not present

## 2015-01-14 DIAGNOSIS — R262 Difficulty in walking, not elsewhere classified: Secondary | ICD-10-CM | POA: Diagnosis not present

## 2015-01-14 LAB — CBC AND DIFFERENTIAL
HEMOGLOBIN: 7.1 g/dL — AB (ref 12.0–16.0)
PLATELETS: 242 10*3/uL (ref 150–399)
WBC: 9.6 10*3/mL

## 2015-01-15 DIAGNOSIS — L02612 Cutaneous abscess of left foot: Secondary | ICD-10-CM | POA: Diagnosis not present

## 2015-01-15 DIAGNOSIS — T8460XD Infection and inflammatory reaction due to internal fixation device of unspecified site, subsequent encounter: Secondary | ICD-10-CM | POA: Diagnosis not present

## 2015-01-15 DIAGNOSIS — Z4789 Encounter for other orthopedic aftercare: Secondary | ICD-10-CM | POA: Diagnosis not present

## 2015-01-15 DIAGNOSIS — R2681 Unsteadiness on feet: Secondary | ICD-10-CM | POA: Diagnosis not present

## 2015-01-15 DIAGNOSIS — E11610 Type 2 diabetes mellitus with diabetic neuropathic arthropathy: Secondary | ICD-10-CM | POA: Diagnosis not present

## 2015-01-15 DIAGNOSIS — M6281 Muscle weakness (generalized): Secondary | ICD-10-CM | POA: Diagnosis not present

## 2015-01-15 DIAGNOSIS — Z981 Arthrodesis status: Secondary | ICD-10-CM | POA: Diagnosis not present

## 2015-01-15 DIAGNOSIS — M25572 Pain in left ankle and joints of left foot: Secondary | ICD-10-CM | POA: Diagnosis not present

## 2015-01-15 DIAGNOSIS — R262 Difficulty in walking, not elsewhere classified: Secondary | ICD-10-CM | POA: Diagnosis not present

## 2015-01-16 DIAGNOSIS — Z981 Arthrodesis status: Secondary | ICD-10-CM | POA: Diagnosis not present

## 2015-01-16 DIAGNOSIS — L02612 Cutaneous abscess of left foot: Secondary | ICD-10-CM | POA: Diagnosis not present

## 2015-01-16 DIAGNOSIS — M25572 Pain in left ankle and joints of left foot: Secondary | ICD-10-CM | POA: Diagnosis not present

## 2015-01-16 DIAGNOSIS — R2681 Unsteadiness on feet: Secondary | ICD-10-CM | POA: Diagnosis not present

## 2015-01-16 DIAGNOSIS — R262 Difficulty in walking, not elsewhere classified: Secondary | ICD-10-CM | POA: Diagnosis not present

## 2015-01-16 DIAGNOSIS — M6281 Muscle weakness (generalized): Secondary | ICD-10-CM | POA: Diagnosis not present

## 2015-01-20 DIAGNOSIS — L02612 Cutaneous abscess of left foot: Secondary | ICD-10-CM | POA: Diagnosis not present

## 2015-01-20 DIAGNOSIS — M25572 Pain in left ankle and joints of left foot: Secondary | ICD-10-CM | POA: Diagnosis not present

## 2015-01-20 DIAGNOSIS — M6281 Muscle weakness (generalized): Secondary | ICD-10-CM | POA: Diagnosis not present

## 2015-01-20 DIAGNOSIS — Z981 Arthrodesis status: Secondary | ICD-10-CM | POA: Diagnosis not present

## 2015-01-20 DIAGNOSIS — R2681 Unsteadiness on feet: Secondary | ICD-10-CM | POA: Diagnosis not present

## 2015-01-20 DIAGNOSIS — R262 Difficulty in walking, not elsewhere classified: Secondary | ICD-10-CM | POA: Diagnosis not present

## 2015-01-20 LAB — CBC AND DIFFERENTIAL
HCT: 24 % — AB (ref 36–46)
Hemoglobin: 7.8 g/dL — AB (ref 12.0–16.0)
Platelets: 392 10*3/uL (ref 150–399)
WBC: 9.5 10^3/mL

## 2015-01-22 DIAGNOSIS — R2681 Unsteadiness on feet: Secondary | ICD-10-CM | POA: Diagnosis not present

## 2015-01-22 DIAGNOSIS — L02612 Cutaneous abscess of left foot: Secondary | ICD-10-CM | POA: Diagnosis not present

## 2015-01-22 DIAGNOSIS — R262 Difficulty in walking, not elsewhere classified: Secondary | ICD-10-CM | POA: Diagnosis not present

## 2015-01-22 DIAGNOSIS — Z981 Arthrodesis status: Secondary | ICD-10-CM | POA: Diagnosis not present

## 2015-01-22 DIAGNOSIS — M25572 Pain in left ankle and joints of left foot: Secondary | ICD-10-CM | POA: Diagnosis not present

## 2015-01-22 DIAGNOSIS — M6281 Muscle weakness (generalized): Secondary | ICD-10-CM | POA: Diagnosis not present

## 2015-01-23 DIAGNOSIS — Z4789 Encounter for other orthopedic aftercare: Secondary | ICD-10-CM | POA: Diagnosis not present

## 2015-01-23 DIAGNOSIS — T8460XD Infection and inflammatory reaction due to internal fixation device of unspecified site, subsequent encounter: Secondary | ICD-10-CM | POA: Diagnosis not present

## 2015-01-23 DIAGNOSIS — M25572 Pain in left ankle and joints of left foot: Secondary | ICD-10-CM | POA: Diagnosis not present

## 2015-01-23 DIAGNOSIS — R2681 Unsteadiness on feet: Secondary | ICD-10-CM | POA: Diagnosis not present

## 2015-01-23 DIAGNOSIS — M6281 Muscle weakness (generalized): Secondary | ICD-10-CM | POA: Diagnosis not present

## 2015-01-23 DIAGNOSIS — R262 Difficulty in walking, not elsewhere classified: Secondary | ICD-10-CM | POA: Diagnosis not present

## 2015-01-23 DIAGNOSIS — L02612 Cutaneous abscess of left foot: Secondary | ICD-10-CM | POA: Diagnosis not present

## 2015-01-23 DIAGNOSIS — E11610 Type 2 diabetes mellitus with diabetic neuropathic arthropathy: Secondary | ICD-10-CM | POA: Diagnosis not present

## 2015-01-23 DIAGNOSIS — Z981 Arthrodesis status: Secondary | ICD-10-CM | POA: Diagnosis not present

## 2015-01-26 DIAGNOSIS — R2681 Unsteadiness on feet: Secondary | ICD-10-CM | POA: Diagnosis not present

## 2015-01-26 DIAGNOSIS — Z981 Arthrodesis status: Secondary | ICD-10-CM | POA: Diagnosis not present

## 2015-01-26 DIAGNOSIS — R262 Difficulty in walking, not elsewhere classified: Secondary | ICD-10-CM | POA: Diagnosis not present

## 2015-01-26 DIAGNOSIS — M25572 Pain in left ankle and joints of left foot: Secondary | ICD-10-CM | POA: Diagnosis not present

## 2015-01-26 DIAGNOSIS — L02612 Cutaneous abscess of left foot: Secondary | ICD-10-CM | POA: Diagnosis not present

## 2015-01-26 DIAGNOSIS — M6281 Muscle weakness (generalized): Secondary | ICD-10-CM | POA: Diagnosis not present

## 2015-01-29 DIAGNOSIS — M6281 Muscle weakness (generalized): Secondary | ICD-10-CM | POA: Diagnosis not present

## 2015-01-29 DIAGNOSIS — R2681 Unsteadiness on feet: Secondary | ICD-10-CM | POA: Diagnosis not present

## 2015-01-29 DIAGNOSIS — M25572 Pain in left ankle and joints of left foot: Secondary | ICD-10-CM | POA: Diagnosis not present

## 2015-01-29 DIAGNOSIS — Z981 Arthrodesis status: Secondary | ICD-10-CM | POA: Diagnosis not present

## 2015-01-29 DIAGNOSIS — L02612 Cutaneous abscess of left foot: Secondary | ICD-10-CM | POA: Diagnosis not present

## 2015-01-29 DIAGNOSIS — R262 Difficulty in walking, not elsewhere classified: Secondary | ICD-10-CM | POA: Diagnosis not present

## 2015-01-30 ENCOUNTER — Other Ambulatory Visit: Payer: Self-pay | Admitting: Orthopedic Surgery

## 2015-01-30 DIAGNOSIS — R2681 Unsteadiness on feet: Secondary | ICD-10-CM | POA: Diagnosis not present

## 2015-01-30 DIAGNOSIS — R262 Difficulty in walking, not elsewhere classified: Secondary | ICD-10-CM | POA: Diagnosis not present

## 2015-01-30 DIAGNOSIS — M25572 Pain in left ankle and joints of left foot: Secondary | ICD-10-CM | POA: Diagnosis not present

## 2015-01-30 DIAGNOSIS — L02612 Cutaneous abscess of left foot: Secondary | ICD-10-CM | POA: Diagnosis not present

## 2015-01-30 DIAGNOSIS — Z981 Arthrodesis status: Secondary | ICD-10-CM | POA: Diagnosis not present

## 2015-01-30 DIAGNOSIS — M6281 Muscle weakness (generalized): Secondary | ICD-10-CM | POA: Diagnosis not present

## 2015-02-03 DIAGNOSIS — L02612 Cutaneous abscess of left foot: Secondary | ICD-10-CM | POA: Diagnosis not present

## 2015-02-03 DIAGNOSIS — Z981 Arthrodesis status: Secondary | ICD-10-CM | POA: Diagnosis not present

## 2015-02-03 DIAGNOSIS — M6281 Muscle weakness (generalized): Secondary | ICD-10-CM | POA: Diagnosis not present

## 2015-02-03 DIAGNOSIS — R2681 Unsteadiness on feet: Secondary | ICD-10-CM | POA: Diagnosis not present

## 2015-02-03 DIAGNOSIS — R262 Difficulty in walking, not elsewhere classified: Secondary | ICD-10-CM | POA: Diagnosis not present

## 2015-02-03 DIAGNOSIS — M25572 Pain in left ankle and joints of left foot: Secondary | ICD-10-CM | POA: Diagnosis not present

## 2015-02-05 DIAGNOSIS — M6281 Muscle weakness (generalized): Secondary | ICD-10-CM | POA: Diagnosis not present

## 2015-02-05 DIAGNOSIS — R2681 Unsteadiness on feet: Secondary | ICD-10-CM | POA: Diagnosis not present

## 2015-02-05 DIAGNOSIS — M25572 Pain in left ankle and joints of left foot: Secondary | ICD-10-CM | POA: Diagnosis not present

## 2015-02-05 DIAGNOSIS — Z981 Arthrodesis status: Secondary | ICD-10-CM | POA: Diagnosis not present

## 2015-02-05 DIAGNOSIS — R262 Difficulty in walking, not elsewhere classified: Secondary | ICD-10-CM | POA: Diagnosis not present

## 2015-02-05 DIAGNOSIS — L02612 Cutaneous abscess of left foot: Secondary | ICD-10-CM | POA: Diagnosis not present

## 2015-02-06 LAB — HEPATIC FUNCTION PANEL
ALK PHOS: 65 U/L (ref 25–125)
ALT: 6 U/L — AB (ref 7–35)
AST: 13 U/L (ref 13–35)
BILIRUBIN DIRECT: 0.02 mg/dL (ref 0.01–0.4)
Bilirubin, Total: 0.2 mg/dL

## 2015-02-06 LAB — LIPID PANEL
Cholesterol: 91 mg/dL (ref 0–200)
HDL: 34 mg/dL — AB (ref 35–70)
LDL CALC: 45 mg/dL
TRIGLYCERIDES: 64 mg/dL (ref 40–160)

## 2015-02-06 LAB — HEMOGLOBIN A1C: Hemoglobin A1C: 7

## 2015-02-10 DIAGNOSIS — M6281 Muscle weakness (generalized): Secondary | ICD-10-CM | POA: Diagnosis not present

## 2015-02-10 DIAGNOSIS — M25572 Pain in left ankle and joints of left foot: Secondary | ICD-10-CM | POA: Diagnosis not present

## 2015-02-10 DIAGNOSIS — Z981 Arthrodesis status: Secondary | ICD-10-CM | POA: Diagnosis not present

## 2015-02-10 DIAGNOSIS — R262 Difficulty in walking, not elsewhere classified: Secondary | ICD-10-CM | POA: Diagnosis not present

## 2015-02-10 DIAGNOSIS — R2681 Unsteadiness on feet: Secondary | ICD-10-CM | POA: Diagnosis not present

## 2015-02-10 DIAGNOSIS — L02612 Cutaneous abscess of left foot: Secondary | ICD-10-CM | POA: Diagnosis not present

## 2015-02-11 ENCOUNTER — Non-Acute Institutional Stay (SKILLED_NURSING_FACILITY): Payer: Medicare Other | Admitting: Adult Health

## 2015-02-11 ENCOUNTER — Encounter: Payer: Self-pay | Admitting: Adult Health

## 2015-02-11 DIAGNOSIS — E785 Hyperlipidemia, unspecified: Secondary | ICD-10-CM | POA: Diagnosis not present

## 2015-02-11 DIAGNOSIS — G4733 Obstructive sleep apnea (adult) (pediatric): Secondary | ICD-10-CM

## 2015-02-11 DIAGNOSIS — D62 Acute posthemorrhagic anemia: Secondary | ICD-10-CM

## 2015-02-11 DIAGNOSIS — E11610 Type 2 diabetes mellitus with diabetic neuropathic arthropathy: Secondary | ICD-10-CM

## 2015-02-11 DIAGNOSIS — K219 Gastro-esophageal reflux disease without esophagitis: Secondary | ICD-10-CM | POA: Diagnosis not present

## 2015-02-11 DIAGNOSIS — N183 Chronic kidney disease, stage 3 unspecified: Secondary | ICD-10-CM

## 2015-02-11 DIAGNOSIS — K5901 Slow transit constipation: Secondary | ICD-10-CM | POA: Diagnosis not present

## 2015-02-11 DIAGNOSIS — E1122 Type 2 diabetes mellitus with diabetic chronic kidney disease: Secondary | ICD-10-CM | POA: Diagnosis not present

## 2015-02-11 DIAGNOSIS — I10 Essential (primary) hypertension: Secondary | ICD-10-CM | POA: Diagnosis not present

## 2015-02-11 DIAGNOSIS — R2681 Unsteadiness on feet: Secondary | ICD-10-CM

## 2015-02-11 NOTE — Progress Notes (Signed)
Patient ID: [name], female   DOB: [birth year], 60 y.o.   MRN: [id number]    DATE:    02/11/15   MRN:  [id number]  BIRTHDAY: [birth year]  Facility:  Nursing Home Location:  [facility]  Nursing Home Room Number: 902-P  LEVEL OF CARE:  [id number]  Contact Information    Name Relation Home Work [place] Relative [phone]  [phone]   [name]   [phone]       Code Status History    Date Active Date Inactive Code Status Order ID Comments User Context   11/06/2014  2:26 PM 11/10/2014  9:05 PM Full Code [id number]  Watt Climes Inpatient   01/30/2013  5:08 PM 02/02/2013  8:03 PM Full Code [id number]  [name], MD Inpatient       Chief Complaint  Patient presents with  . Medical Management of Chronic Issues    Routine visit    HISTORY OF PRESENT ILLNESS:  This is a 60 year old female who is being seen for a routine visit. She is currently having short-term rehabilitation at [facility] due to having Charcot left foot deformity for which she had surgical repair on 11/06/15. She is currently on Bactrim DS for left foot infection. She is scheduled for removal of external fixator on left foot    PAST MEDICAL HISTORY:  Past Medical History  Diagnosis Date  . Normocytic anemia   . Hypertension   . Foot ulcer, left (HCC)   . Leg edema   . Glaucoma   . GERD (gastroesophageal reflux disease)   . High cholesterol   . Type II diabetes mellitus (HCC)   . H/O hiatal hernia   . Arthritis     "right leg" (01/30/2013)  . CKD (chronic kidney disease), stage III     [name] 01/30/2013  . Charcot foot due to diabetes mellitus (HCC)   . Acute blood loss anemia   . Slow transit constipation   . Hyperlipidemia   . OSA on CPAP     uses CPAP every night  . MRSA (methicillin resistant Staphylococcus aureus)     left foot, currently on Bactrim     CURRENT MEDICATIONS: Reviewed  Patient's Medications  New  Prescriptions   No medications on file  Previous Medications       ASPIRIN 325 MG EC TABLET    Take 325 mg by mouth daily.   ATORVASTATIN (LIPITOR) 40 MG TABLET    Take 1 tablet (40 mg total) by mouth daily.   BIOTIN 1000 MCG TABLET    Take 1,000 mcg by mouth daily.   CALCIUM-VITAMIN D (OSCAL-500) 500-400 MG-UNIT TABLET    Take 1 tablet by mouth daily.   COMBIGAN 0.2-0.5 % OPHTHALMIC SOLUTION    Place one drop to both eyes twice daily for glaucoma   FERROUS SULFATE 325 (65 FE) MG TABLET    Take 325 mg by mouth 2 (two) times daily.   FUROSEMIDE (LASIX) 40 MG TABLET    TAKE 1 TABLET BY MOUTH DAILY   LATANOPROST (XALATAN) 0.005 % OPHTHALMIC SOLUTION    Place 1 drop into both eyes at bedtime.   LISINOPRIL (PRINIVIL,ZESTRIL) 10 MG TABLET    Take 1 tablet (10 mg total) by mouth daily.   LOPERAMIDE (IMODIUM) 2 MG CAPSULE    Take by mouth. Give 2 capsules (4 mg) by mouth initially (1st dose) per standing order. DO NOT EXCEED 16 MG/ 24HOUR PRN DIARRHEA  METFORMIN (GLUCOPHAGE) 1000 MG TABLET    Take 0.5 tablets (500 mg total) by mouth 2 (two) times daily with a meal.   MULTIPLE VITAMIN (MULTIVITAMIN) TABLET    Take 1 tablet by mouth daily.    PANTOPRAZOLE SODIUM PO    Take 1 tablet by mouth. Daily before meal   SACCHAROMYCES BOULARDII (FLORASTOR) 250 MG CAPSULE    Take 250 mg by mouth 2 (two) times daily. For 17 weeks. Stop date: 04/14/15   SENNA (SENOKOT) 8.6 MG TABLET    Take 2 tablets by mouth at bedtime.   SITAGLIPTIN (JANUVIA) 100 MG TABLET    Take 1 tablet (100 mg total) by mouth daily.   VITAMIN C (ASCORBIC ACID) 500 MG TABLET    Take 500 mg by mouth daily.   ZINC 50 MG CAPS    Take 1 capsule by mouth daily.          ACETAMINOPHEN (TYLENOL) 500 MG TABLET    Take 1,000 mg by mouth every 6 (six) hours as needed.   SULFAMETHOXAZOLE-TRIMETHOPRIM (BACTRIM DS,SEPTRA DS) 800-160 MG TABLET    Take 1 tablet by mouth 2 (two) times daily. Until surgery day 02/19/15    Allergies  Allergen Reactions  .  Codeine     REACTION: swelling in throat and itching  . Pravastatin Other (See Comments)    cramps     REVIEW OF SYSTEMS:  GENERAL: no change in appetite, no fatigue, no weight changes, no fever, chills or weakness EYES: Denies change in vision, dry eyes, eye pain, itching or discharge EARS: Denies change in hearing, ringing in ears, or earache NOSE: Denies nasal congestion or epistaxis MOUTH and THROAT: Denies oral discomfort, gingival pain or bleeding, pain from teeth or hoarseness   RESPIRATORY: no cough, SOB, DOE, wheezing, hemoptysis CARDIAC: no chest pain, or palpitations GI: no abdominal pain, diarrhea, constipation, heart burn, nausea or vomiting GU: Denies dysuria, frequency, hematuria, incontinence, or discharge PSYCHIATRIC: Denies feeling of depression or anxiety. No report of hallucinations, insomnia, paranoia, or agitation   PHYSICAL EXAMINATION  GENERAL APPEARANCE: Well nourished. In no acute distress. Normal body habitus SKIN:  Skin is warm and dry.  HEAD: Normal in size and contour. No evidence of trauma EYES: Lids open and close normally. No blepharitis, entropion or ectropion. PERRL. Conjunctivae are clear and sclerae are white. Lenses are without opacity EARS: Pinnae are normal. Patient hears normal voice tunes of the examiner MOUTH and THROAT: Lips are without lesions. Oral mucosa is moist and without lesions. Tongue is normal in shape, size, and color and without lesions NECK: supple, trachea midline, no neck masses, no thyroid tenderness, no thyromegaly LYMPHATICS: no LAN in the neck, no supraclavicular LAN RESPIRATORY: breathing is even & unlabored, BS CTAB CARDIAC: RRR, no murmur,no extra heart sounds, no edema GI: abdomen soft, normal BS, no masses, no tenderness, no hepatomegaly, no splenomegaly EXTREMITIES:  Able to move X 4 extremities; left foot with extrernal fixator PSYCHIATRIC: Alert and oriented X 3. Affect and behavior are  appropriate  LABS/RADIOLOGY: Labs reviewed: Basic Metabolic Panel:  Recent Labs  16/10/96 1154   NA 138   K 5.0   CL 105   CO2 26   GLUCOSE 96   BUN 28*   CREATININE 1.67*   CALCIUM 9.4    CBC:  Recent Labs  10/29/14 1154 01/14/15 01/20/15   WBC 7.5 9.6 9.5   HGB 10.1* 7.1* 7.8*   HCT 31.5*  --  24*   MCV 85.1  --   --  PLT 241 242 392    CBG:  Recent Labs  11/10/14 1110 11/10/14 1724   GLUCAP 103* 91      ASSESSMENT/PLAN:  Unsteady gait - continue rehabilitation; follow-up precaution  Charcot left foot deformity S/P surgical repair -  continue rehabilitation; aspirin EC 325 mg 1 tab by mouth daily for DVT prophylaxis; Tylenol 1000 mg by mouth every 6 hours when necessary for pain; Biaxin 500 mg 1 tab by mouth every 6 hours when necessary for muscle spasm; LLE WBAT; follow-up with [name], orthopedic surgeon  Anemia, acute blood loss - hemoglobin 7.8; continue for sulfate 325 mg 1 tab by mouth twice a day  Chronic kidney disease is stage III  -creatinine 1.67; recheck 1.61, stable  Constipation - continue senna S2 tabs by mouth daily at bedtime  OSA - continue CPAP machine  Hyperlipidemia - continue Lipitor 40 mg 1 tab by mouth daily  Diabetes mellitus, type II - continue metformin 1000 mg 1/2 TAB = 500 mg PO BID and the new via 100 mg 1 tab by mouth daily  GERD - continue Protonix 40 mg 1 tab by mouth daily  Hypertension - continue Lasix 40 mg 1 tab by mouth daily and lisinopril 10 mg 1 tab by mouth daily  Left foot infection - continue Bactrim DS 1 tab by mouth twice a day TILL 02/19/15      Goals of care:  Short-term rehabilitation    [place], NP [facility] [phone]

## 2015-02-16 ENCOUNTER — Encounter (HOSPITAL_BASED_OUTPATIENT_CLINIC_OR_DEPARTMENT_OTHER): Payer: Self-pay | Admitting: *Deleted

## 2015-02-16 DIAGNOSIS — R2681 Unsteadiness on feet: Secondary | ICD-10-CM | POA: Diagnosis not present

## 2015-02-16 DIAGNOSIS — M6281 Muscle weakness (generalized): Secondary | ICD-10-CM | POA: Diagnosis not present

## 2015-02-16 DIAGNOSIS — M25572 Pain in left ankle and joints of left foot: Secondary | ICD-10-CM | POA: Diagnosis not present

## 2015-02-16 DIAGNOSIS — R262 Difficulty in walking, not elsewhere classified: Secondary | ICD-10-CM | POA: Diagnosis not present

## 2015-02-16 DIAGNOSIS — L02612 Cutaneous abscess of left foot: Secondary | ICD-10-CM | POA: Diagnosis not present

## 2015-02-16 DIAGNOSIS — Z981 Arthrodesis status: Secondary | ICD-10-CM | POA: Diagnosis not present

## 2015-02-19 ENCOUNTER — Ambulatory Visit (HOSPITAL_BASED_OUTPATIENT_CLINIC_OR_DEPARTMENT_OTHER): Payer: Medicare Other | Admitting: Certified Registered"

## 2015-02-19 ENCOUNTER — Encounter (HOSPITAL_BASED_OUTPATIENT_CLINIC_OR_DEPARTMENT_OTHER): Payer: Self-pay

## 2015-02-19 ENCOUNTER — Ambulatory Visit (HOSPITAL_BASED_OUTPATIENT_CLINIC_OR_DEPARTMENT_OTHER)
Admission: RE | Admit: 2015-02-19 | Discharge: 2015-02-19 | Disposition: A | Discharge status: Skilled Nursing Facility | Payer: Medicare Other | Source: Ambulatory Visit | Attending: Orthopedic Surgery | Admitting: Orthopedic Surgery

## 2015-02-19 ENCOUNTER — Encounter (HOSPITAL_BASED_OUTPATIENT_CLINIC_OR_DEPARTMENT_OTHER)
Admission: RE | Disposition: A | Discharge status: Skilled Nursing Facility | Payer: Self-pay | Source: Ambulatory Visit | Attending: Orthopedic Surgery

## 2015-02-19 DIAGNOSIS — Z87891 Personal history of nicotine dependence: Secondary | ICD-10-CM | POA: Insufficient documentation

## 2015-02-19 DIAGNOSIS — Z4689 Encounter for fitting and adjustment of other specified devices: Secondary | ICD-10-CM | POA: Diagnosis not present

## 2015-02-19 DIAGNOSIS — N183 Chronic kidney disease, stage 3 (moderate): Secondary | ICD-10-CM | POA: Insufficient documentation

## 2015-02-19 DIAGNOSIS — G4733 Obstructive sleep apnea (adult) (pediatric): Secondary | ICD-10-CM | POA: Insufficient documentation

## 2015-02-19 DIAGNOSIS — L02612 Cutaneous abscess of left foot: Secondary | ICD-10-CM | POA: Insufficient documentation

## 2015-02-19 DIAGNOSIS — M1990 Unspecified osteoarthritis, unspecified site: Secondary | ICD-10-CM | POA: Insufficient documentation

## 2015-02-19 DIAGNOSIS — K219 Gastro-esophageal reflux disease without esophagitis: Secondary | ICD-10-CM | POA: Insufficient documentation

## 2015-02-19 DIAGNOSIS — Z7984 Long term (current) use of oral hypoglycemic drugs: Secondary | ICD-10-CM | POA: Insufficient documentation

## 2015-02-19 DIAGNOSIS — Z79899 Other long term (current) drug therapy: Secondary | ICD-10-CM | POA: Insufficient documentation

## 2015-02-19 DIAGNOSIS — Z8614 Personal history of Methicillin resistant Staphylococcus aureus infection: Secondary | ICD-10-CM | POA: Insufficient documentation

## 2015-02-19 DIAGNOSIS — I129 Hypertensive chronic kidney disease with stage 1 through stage 4 chronic kidney disease, or unspecified chronic kidney disease: Secondary | ICD-10-CM | POA: Diagnosis not present

## 2015-02-19 DIAGNOSIS — M14672 Charcot's joint, left ankle and foot: Secondary | ICD-10-CM | POA: Diagnosis not present

## 2015-02-19 DIAGNOSIS — Z472 Encounter for removal of internal fixation device: Secondary | ICD-10-CM | POA: Diagnosis not present

## 2015-02-19 DIAGNOSIS — Z7982 Long term (current) use of aspirin: Secondary | ICD-10-CM | POA: Insufficient documentation

## 2015-02-19 DIAGNOSIS — E7800 Pure hypercholesterolemia, unspecified: Secondary | ICD-10-CM | POA: Insufficient documentation

## 2015-02-19 DIAGNOSIS — E11610 Type 2 diabetes mellitus with diabetic neuropathic arthropathy: Secondary | ICD-10-CM | POA: Insufficient documentation

## 2015-02-19 DIAGNOSIS — E1122 Type 2 diabetes mellitus with diabetic chronic kidney disease: Secondary | ICD-10-CM | POA: Diagnosis not present

## 2015-02-19 HISTORY — PX: HARDWARE REMOVAL: SHX979

## 2015-02-19 HISTORY — DX: Methicillin resistant Staphylococcus aureus infection, unspecified site: A49.02

## 2015-02-19 LAB — POCT I-STAT, CHEM 8
BUN: 42 mg/dL — ABNORMAL HIGH (ref 6–20)
Calcium, Ion: 1.25 mmol/L — ABNORMAL HIGH (ref 1.12–1.23)
Chloride: 108 mmol/L (ref 101–111)
Creatinine, Ser: 2.3 mg/dL — ABNORMAL HIGH (ref 0.44–1.00)
GLUCOSE: 88 mg/dL (ref 65–99)
HEMATOCRIT: 28 % — AB (ref 36.0–46.0)
HEMOGLOBIN: 9.5 g/dL — AB (ref 12.0–15.0)
POTASSIUM: 5.2 mmol/L — AB (ref 3.5–5.1)
Sodium: 139 mmol/L (ref 135–145)
TCO2: 22 mmol/L (ref 0–100)

## 2015-02-19 LAB — GLUCOSE, CAPILLARY: GLUCOSE-CAPILLARY: 78 mg/dL (ref 65–99)

## 2015-02-19 SURGERY — REMOVAL, HARDWARE
Anesthesia: Monitor Anesthesia Care | Site: Ankle | Laterality: Left

## 2015-02-19 MED ORDER — 0.9 % SODIUM CHLORIDE (POUR BTL) OPTIME
TOPICAL | Status: DC | PRN
  Administered 2015-02-19: 500 mL

## 2015-02-19 MED ORDER — VANCOMYCIN HCL IN DEXTROSE 1-5 GM/200ML-% IV SOLN
1000.0000 mg | Freq: Once | INTRAVENOUS | Status: AC
  Administered 2015-02-19: 1000 mg via INTRAVENOUS

## 2015-02-19 MED ORDER — FENTANYL CITRATE (PF) 100 MCG/2ML IJ SOLN
25.0000 ug | INTRAMUSCULAR | Status: DC | PRN
  Administered 2015-02-19 (×2): 25 ug via INTRAVENOUS

## 2015-02-19 MED ORDER — ONDANSETRON HCL 4 MG/2ML IJ SOLN
INTRAMUSCULAR | Status: AC
  Filled 2015-02-19: qty 2

## 2015-02-19 MED ORDER — ONDANSETRON HCL 4 MG/2ML IJ SOLN
INTRAMUSCULAR | Status: DC | PRN
Start: 2015-02-19 — End: 2015-02-19
  Administered 2015-02-19: 4 mg via INTRAVENOUS

## 2015-02-19 MED ORDER — LIDOCAINE HCL (CARDIAC) 20 MG/ML IV SOLN
INTRAVENOUS | Status: DC | PRN
Start: 2015-02-19 — End: 2015-02-19
  Administered 2015-02-19: 30 mg via INTRAVENOUS

## 2015-02-19 MED ORDER — FENTANYL CITRATE (PF) 100 MCG/2ML IJ SOLN
INTRAMUSCULAR | Status: AC
  Filled 2015-02-19: qty 2

## 2015-02-19 MED ORDER — CHLORHEXIDINE GLUCONATE 4 % EX LIQD: 60.0000 mL | Freq: Once | CUTANEOUS | Status: DC

## 2015-02-19 MED ORDER — CEFAZOLIN SODIUM-DEXTROSE 2-3 GM-% IV SOLR
INTRAVENOUS | Status: AC
  Filled 2015-02-19: qty 100

## 2015-02-19 MED ORDER — LIDOCAINE HCL (CARDIAC) 20 MG/ML IV SOLN
INTRAVENOUS | Status: AC
  Filled 2015-02-19: qty 5

## 2015-02-19 MED ORDER — SCOPOLAMINE 1 MG/3DAYS TD PT72: 1.0000 | MEDICATED_PATCH | Freq: Once | TRANSDERMAL | Status: DC | PRN

## 2015-02-19 MED ORDER — PROPOFOL 10 MG/ML IV BOLUS
INTRAVENOUS | Status: DC | PRN
  Administered 2015-02-19 (×3): 20 mg via INTRAVENOUS

## 2015-02-19 MED ORDER — CEFAZOLIN SODIUM-DEXTROSE 2-3 GM-% IV SOLR: 2.0000 g | INTRAVENOUS | Status: DC

## 2015-02-19 MED ORDER — MIDAZOLAM HCL 2 MG/2ML IJ SOLN
INTRAMUSCULAR | Status: AC
  Filled 2015-02-19: qty 2

## 2015-02-19 MED ORDER — VANCOMYCIN HCL 1000 MG IV SOLR
INTRAVENOUS | Status: AC
  Filled 2015-02-19: qty 1000

## 2015-02-19 MED ORDER — PROMETHAZINE HCL 25 MG/ML IJ SOLN: 6.2500 mg | INTRAMUSCULAR | Status: DC | PRN

## 2015-02-19 MED ORDER — GLYCOPYRROLATE 0.2 MG/ML IJ SOLN: 0.2000 mg | Freq: Once | INTRAMUSCULAR | Status: DC | PRN

## 2015-02-19 MED ORDER — MIDAZOLAM HCL 2 MG/2ML IJ SOLN
1.0000 mg | INTRAMUSCULAR | Status: DC | PRN
  Administered 2015-02-19: 1 mg via INTRAVENOUS

## 2015-02-19 MED ORDER — CEFAZOLIN SODIUM 1-5 GM-% IV SOLN
INTRAVENOUS | Status: AC
  Filled 2015-02-19: qty 50

## 2015-02-19 MED ORDER — VANCOMYCIN HCL IN DEXTROSE 1-5 GM/200ML-% IV SOLN
INTRAVENOUS | Status: AC
  Filled 2015-02-19: qty 200

## 2015-02-19 MED ORDER — FENTANYL CITRATE (PF) 100 MCG/2ML IJ SOLN
50.0000 ug | INTRAMUSCULAR | Status: DC | PRN
  Administered 2015-02-19 (×2): 25 ug via INTRAVENOUS

## 2015-02-19 MED ORDER — HYDROCODONE-ACETAMINOPHEN 7.5-325 MG PO TABS: 1.0000 | ORAL_TABLET | Freq: Once | ORAL | Status: DC | PRN

## 2015-02-19 MED ORDER — LACTATED RINGERS IV SOLN
INTRAVENOUS | Status: DC
  Administered 2015-02-19 (×2): via INTRAVENOUS

## 2015-02-19 MED ORDER — SODIUM CHLORIDE 0.9 % IV SOLN: INTRAVENOUS | Status: DC

## 2015-02-19 MED ORDER — DEXAMETHASONE SODIUM PHOSPHATE 10 MG/ML IJ SOLN
INTRAMUSCULAR | Status: AC
  Filled 2015-02-19: qty 1

## 2015-02-19 SURGICAL SUPPLY — 70 items
BANDAGE ACE 4X5 VEL STRL LF (GAUZE/BANDAGES/DRESSINGS) IMPLANT
BANDAGE ESMARK 6X9 LF (GAUZE/BANDAGES/DRESSINGS) IMPLANT
BENZOIN TINCTURE PRP APPL 2/3 (GAUZE/BANDAGES/DRESSINGS) IMPLANT
BLADE SURG 15 STRL LF DISP TIS (BLADE) ×1 IMPLANT
BLADE SURG 15 STRL SS (BLADE) ×2
BNDG COHESIVE 4X5 TAN STRL (GAUZE/BANDAGES/DRESSINGS) ×3 IMPLANT
BNDG COHESIVE 6X5 TAN STRL LF (GAUZE/BANDAGES/DRESSINGS) ×3 IMPLANT
BNDG ESMARK 4X9 LF (GAUZE/BANDAGES/DRESSINGS) IMPLANT
BNDG ESMARK 6X9 LF (GAUZE/BANDAGES/DRESSINGS)
BRUSH SCRUB EZ PLAIN DRY (MISCELLANEOUS) IMPLANT
CHLORAPREP W/TINT 26ML (MISCELLANEOUS) IMPLANT
CLOSURE WOUND 1/2 X4 (GAUZE/BANDAGES/DRESSINGS)
COVER BACK TABLE 60X90IN (DRAPES) IMPLANT
CUFF TOURNIQUET SINGLE 34IN LL (TOURNIQUET CUFF) IMPLANT
DECANTER SPIKE VIAL GLASS SM (MISCELLANEOUS) IMPLANT
DRAPE EXTREMITY T 121X128X90 (DRAPE) IMPLANT
DRAPE OEC MINIVIEW 54X84 (DRAPES) IMPLANT
DRAPE SURG 17X23 STRL (DRAPES) IMPLANT
DRAPE U-SHAPE 47X51 STRL (DRAPES) IMPLANT
DRSG MEPITEL 4X7.2 (GAUZE/BANDAGES/DRESSINGS) IMPLANT
DRSG PAD ABDOMINAL 8X10 ST (GAUZE/BANDAGES/DRESSINGS) IMPLANT
ELECT REM PT RETURN 9FT ADLT (ELECTROSURGICAL)
ELECTRODE REM PT RTRN 9FT ADLT (ELECTROSURGICAL) IMPLANT
GAUZE SPONGE 4X4 12PLY STRL (GAUZE/BANDAGES/DRESSINGS) ×3 IMPLANT
GLOVE BIO SURGEON STRL SZ8 (GLOVE) ×3 IMPLANT
GLOVE BIOGEL PI IND STRL 7.0 (GLOVE) ×3 IMPLANT
GLOVE BIOGEL PI IND STRL 8 (GLOVE) ×1 IMPLANT
GLOVE BIOGEL PI INDICATOR 7.0 (GLOVE) ×6
GLOVE BIOGEL PI INDICATOR 8 (GLOVE) ×2
GLOVE ECLIPSE 6.5 STRL STRAW (GLOVE) ×6 IMPLANT
GLOVE ECLIPSE 7.5 STRL STRAW (GLOVE) IMPLANT
GLOVE EXAM NITRILE MD LF STRL (GLOVE) IMPLANT
GOWN STRL REUS W/ TWL LRG LVL3 (GOWN DISPOSABLE) ×1 IMPLANT
GOWN STRL REUS W/ TWL XL LVL3 (GOWN DISPOSABLE) ×1 IMPLANT
GOWN STRL REUS W/TWL LRG LVL3 (GOWN DISPOSABLE) ×2
GOWN STRL REUS W/TWL XL LVL3 (GOWN DISPOSABLE) ×2
NEEDLE HYPO 22GX1.5 SAFETY (NEEDLE) IMPLANT
PACK BASIN DAY SURGERY FS (CUSTOM PROCEDURE TRAY) ×3 IMPLANT
PAD ALCOHOL SWAB (MISCELLANEOUS) IMPLANT
PAD CAST 4YDX4 CTTN HI CHSV (CAST SUPPLIES) ×1 IMPLANT
PADDING CAST ABS 4INX4YD NS (CAST SUPPLIES) ×2
PADDING CAST ABS COTTON 4X4 ST (CAST SUPPLIES) ×1 IMPLANT
PADDING CAST COTTON 4X4 STRL (CAST SUPPLIES) ×2
PADDING CAST COTTON 6X4 STRL (CAST SUPPLIES) ×3 IMPLANT
PADDING CAST SYNTHETIC 4 (CAST SUPPLIES) ×2
PADDING CAST SYNTHETIC 4X4 STR (CAST SUPPLIES) ×1 IMPLANT
PENCIL BUTTON HOLSTER BLD 10FT (ELECTRODE) IMPLANT
SANITIZER HAND PURELL 535ML FO (MISCELLANEOUS) IMPLANT
SCOTCHCAST PLUS 3X4 WHITE (CAST SUPPLIES) ×9 IMPLANT
SHEET MEDIUM DRAPE 40X70 STRL (DRAPES) ×3 IMPLANT
SLEEVE SCD COMPRESS KNEE MED (MISCELLANEOUS) ×3 IMPLANT
SPLINT FAST PLASTER 5X30 (CAST SUPPLIES)
SPLINT PLASTER CAST FAST 5X30 (CAST SUPPLIES) IMPLANT
SPONGE LAP 18X18 X RAY DECT (DISPOSABLE) ×3 IMPLANT
STOCKINETTE 6  STRL (DRAPES)
STOCKINETTE 6 STRL (DRAPES) IMPLANT
STRIP CLOSURE SKIN 1/2X4 (GAUZE/BANDAGES/DRESSINGS) IMPLANT
SUCTION FRAZIER HANDLE 10FR (MISCELLANEOUS)
SUCTION TUBE FRAZIER 10FR DISP (MISCELLANEOUS) IMPLANT
SUT ETHILON 3 0 PS 1 (SUTURE) IMPLANT
SUT MNCRL AB 3-0 PS2 18 (SUTURE) IMPLANT
SUT VIC AB 0 SH 27 (SUTURE) IMPLANT
SUT VIC AB 2-0 SH 27 (SUTURE)
SUT VIC AB 2-0 SH 27XBRD (SUTURE) IMPLANT
SYR BULB 3OZ (MISCELLANEOUS) ×3 IMPLANT
SYR CONTROL 10ML LL (SYRINGE) IMPLANT
TOWEL OR 17X24 6PK STRL BLUE (TOWEL DISPOSABLE) ×3 IMPLANT
TUBE CONNECTING 20'X1/4 (TUBING)
TUBE CONNECTING 20X1/4 (TUBING) IMPLANT
UNDERPAD 30X30 (UNDERPADS AND DIAPERS) ×3 IMPLANT

## 2015-02-19 NOTE — Transfer of Care (Signed)
Immediate Anesthesia Transfer of Care Note  Patient: [name]  Procedure(s) Performed: Procedure(s): REMOVAL OF LEFT FOOT EXTERNAL FIXATOR, CASTING OF LEFT ANKLE  (Left)  Patient Location: PACU  Anesthesia Type:MAC  Level of Consciousness: awake, alert , oriented and patient cooperative  Airway & Oxygen Therapy: Patient Spontanous Breathing  Post-op Assessment: Report given to RN and Post -op Vital signs reviewed and stable  Post vital signs: Reviewed and stable  Last Vitals:  Filed Vitals:   02/19/15 1336  BP: 150/69  Pulse: 72  Temp: 36.8 C  Resp: 20    Complications: No apparent anesthesia complications

## 2015-02-19 NOTE — Anesthesia Preprocedure Evaluation (Addendum)
Anesthesia Evaluation  Patient identified by MRN, date of birth, ID band Patient awake    Reviewed: Allergy & Precautions, NPO status , Patient's Chart, lab work & pertinent test results  Airway Mallampati: II  TM Distance: >3 FB Neck ROM: Full    Dental   Pulmonary sleep apnea, Continuous Positive Airway Pressure Ventilation and Oxygen sleep apnea , former smoker,    breath sounds clear to auscultation       Cardiovascular hypertension, Pt. on medications  Rhythm:Regular Rate:Normal     Neuro/Psych    GI/Hepatic GERD  Medicated,  Endo/Other  diabetes, Type 2, Oral Hypoglycemic Agents  Renal/GU Renal InsufficiencyRenal disease     Musculoskeletal  (+) Arthritis ,   Abdominal   Peds  Hematology  (+) anemia ,   Anesthesia Other Findings   Reproductive/Obstetrics                            Anesthesia Physical Anesthesia Plan  ASA: III  Anesthesia Plan: MAC   Post-op Pain Management:    Induction: Intravenous  Airway Management Planned: Natural Airway and Simple Face Mask  Additional Equipment:   Intra-op Plan:   Post-operative Plan:   Informed Consent: I have reviewed the patients History and Physical, chart, labs and discussed the procedure including the risks, benefits and alternatives for the proposed anesthesia with the patient or authorized representative who has indicated his/her understanding and acceptance.     Plan Discussed with: CRNA  Anesthesia Plan Comments:         Anesthesia Quick Evaluation

## 2015-02-19 NOTE — Brief Op Note (Signed)
02/19/2015  3:38 PM  PATIENT:  [name]  60 y.o. 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  Removal of left lower extremity external fixator under anesthesia   2.  Irrigation and excisional debridement of left foot abscess   3.  Application of short leg cast to left lower extremity  SURGEON:  Toni Arthurs, MD  ASSISTANT: n/a  ANESTHESIA:   MAC  EBL:  minimal   TOURNIQUET:  n/a  COMPLICATIONS:  None apparent  DISPOSITION:  Extubated, awake and stable to recovery.  DICTATION ID:  [id number]

## 2015-02-19 NOTE — Discharge Instructions (Addendum)
Toni Arthurs, MD [facility]  Please read the following information regarding your care after surgery.  Medications  X To help prevent blood clots, take an aspirin (325 mg) once a day.  You should also get up every hour while you are awake to move around.    Weight Bearing ? Bear weight when you are able on your operated leg or foot. ? Bear weight only on the heel of your operated foot in the post-op shoe. X Do not bear any weight on the operated leg or foot.  Cast / Splint / Dressing X Keep your splint or cast clean and dry.  Dont put anything (coat hanger, pencil, etc) down inside of it.  If it gets damp, use a hair dryer on the cool setting to dry it.  If it gets soaked, call the office to schedule an appointment for a cast change. ? Remove your dressing 3 days after surgery and cover the incisions with dry dressings.    After your dressing, cast or splint is removed; you may shower, but do not soak or scrub the wound.  Allow the water to run over it, and then gently pat it dry.  Swelling It is normal for you to have swelling where you had surgery.  To reduce swelling and pain, keep your toes above your nose for at least 3 days after surgery.  It may be necessary to keep your foot or leg elevated for several weeks.  If it hurts, it should be elevated.  Follow Up Call my office at [phone] when you are discharged from the hospital or surgery center to schedule an appointment to be seen two weeks after surgery.  Call my office at [phone] if you develop a fever >101.5 F, nausea, vomiting, bleeding from the surgical site or severe pain.        Post Anesthesia Home Care Instructions  Activity: Get plenty of rest for the remainder of the day. A responsible adult should stay with you for 24 hours following the procedure.  For the next 24 hours, DO NOT: -Drive a car -Advertising copywriter -Drink alcoholic beverages -Take any medication unless instructed by your  physician -Make any legal decisions or sign important papers.  Meals: Start with liquid foods such as gelatin or soup. Progress to regular foods as tolerated. Avoid greasy, spicy, heavy foods. If nausea and/or vomiting occur, drink only clear liquids until the nausea and/or vomiting subsides. Call your physician if vomiting continues.  Special Instructions/Symptoms: Your throat may feel dry or sore from the anesthesia or the breathing tube placed in your throat during surgery. If this causes discomfort, gargle with warm salt water. The discomfort should disappear within 24 hours.  If you had a scopolamine patch placed behind your ear for the management of post- operative nausea and/or vomiting:  1. The medication in the patch is effective for 72 hours, after which it should be removed.  Wrap patch in a tissue and discard in the trash. Wash hands thoroughly with soap and water. 2. You may remove the patch earlier than 72 hours if you experience unpleasant side effects which may include dry mouth, dizziness or visual disturbances. 3. Avoid touching the patch. Wash your hands with soap and water after contact with the patch.

## 2015-02-19 NOTE — Anesthesia Postprocedure Evaluation (Signed)
Anesthesia Post Note  Patient: [name]  Procedure(s) Performed: Procedure(s) (LRB): REMOVAL OF LEFT FOOT EXTERNAL FIXATOR, CASTING OF LEFT ANKLE  (Left)  Patient location during evaluation: PACU Anesthesia Type: MAC Level of consciousness: awake and alert Pain management: pain level controlled Vital Signs Assessment: post-procedure vital signs reviewed and stable Respiratory status: spontaneous breathing Cardiovascular status: blood pressure returned to baseline Anesthetic complications: no    Last Vitals:  Filed Vitals:   02/19/15 1615 02/19/15 1630  BP: 138/72 138/60  Pulse: 76 71  Temp:    Resp: 17 13    Last Pain:  Filed Vitals:   02/19/15 1637  PainSc: 2     LLE Motor Response: Purposeful movement (02/19/15 1630) LLE Sensation: Full sensation;Pain (02/19/15 1630)          Kennieth Rad

## 2015-02-19 NOTE — Anesthesia Procedure Notes (Signed)
Procedure Name: MAC Date/Time: 02/19/2015 2:55 PM Performed by: Vasilisa Vore D Pre-anesthesia Checklist: Patient identified, Emergency Drugs available, Suction available, Patient being monitored and Timeout performed Patient Re-evaluated:Patient Re-evaluated prior to inductionOxygen Delivery Method: Simple face mask

## 2015-02-19 NOTE — H&P (Signed)
[name] is an 60 y.o. female.   Chief Complaint: left foot external fixator HPI: 60 y/o female with PMH of diabetes is now 3 mos s/p left foot osteotomy and arthrodesis for charcot foot deformity.  She presents today for removal of her external fixator and casting of the foot.  Past Medical History  Diagnosis Date  . Normocytic anemia   . Hypertension   . Foot ulcer, left (HCC)   . Leg edema   . Glaucoma   . GERD (gastroesophageal reflux disease)   . High cholesterol   . Type II diabetes mellitus (HCC)   . H/O hiatal hernia   . Arthritis     "right leg" (01/30/2013)  . CKD (chronic kidney disease), stage III     [name] 01/30/2013  . Charcot foot due to diabetes mellitus (HCC)   . Acute blood loss anemia   . Slow transit constipation   . Hyperlipidemia   . OSA on CPAP     uses CPAP every night  . MRSA (methicillin resistant Staphylococcus aureus)     left foot, currently on Bactrim    Past Surgical History  Procedure Laterality Date  . Oophorectomy Bilateral 1975-1976  . Appendectomy  1975  . I&d extremity Left 2011    "ulcer on my foot got infected" (01/30/2013)  . Multiple tooth extractions Bilateral   . Achilles tendon lengthening Left 11/06/2014    Procedure: LEFT ACHILLES TENDON LENGTHENING, ;  Surgeon: Toni Arthurs, MD;  Location: [facility] OR;  Service: Orthopedics;  Laterality: Left;  . Foot arthrodesis Left 11/06/2014    Procedure: TALONAVICULAR CANCELLOUS CUBOID ARTHRODESIS WITH CORRECTIVE OSTEOTOMY ;  Surgeon: Toni Arthurs, MD;  Location: [facility] OR;  Service: Orthopedics;  Laterality: Left;    Family History  Problem Relation Age of Onset  . Diabetes Mother   . Hypertension Mother   . Heart disease Father   . Stroke Father   . Diabetes Father   . Hypertension Father   . Gout Father   . Deep vein thrombosis Father    Social History:  reports that she quit smoking about 42 years ago. Her smoking use included Cigarettes. She smoked 0.50 packs per day for 0 years.  She has never used smokeless tobacco. She reports that she does not drink alcohol or use illicit drugs.  Allergies:  Allergies  Allergen Reactions  . Codeine     REACTION: swelling in throat and itching  . Pravastatin Other (See Comments)    cramps    Medications Prior to Admission  Medication Sig Dispense Refill  . acetaminophen (TYLENOL) 500 MG tablet Take 1,000 mg by mouth every 6 (six) hours as needed.    Marland Kitchen aspirin 325 MG EC tablet Take 325 mg by mouth daily.    Marland Kitchen atorvastatin (LIPITOR) 40 MG tablet Take 1 tablet (40 mg total) by mouth daily. 30 tablet 0  . Biotin 1000 MCG tablet Take 1,000 mcg by mouth daily.    . calcium-vitamin D (OSCAL-500) 500-400 MG-UNIT tablet Take 1 tablet by mouth daily.    . COMBIGAN 0.2-0.5 % ophthalmic solution Place one drop to both eyes twice daily for glaucoma    . ferrous sulfate 325 (65 FE) MG tablet Take 325 mg by mouth 2 (two) times daily.    . furosemide (LASIX) 40 MG tablet TAKE 1 TABLET BY MOUTH DAILY 30 tablet 1  . latanoprost (XALATAN) 0.005 % ophthalmic solution Place 1 drop into both eyes at bedtime.    Marland Kitchen  lisinopril (PRINIVIL,ZESTRIL) 10 MG tablet Take 1 tablet (10 mg total) by mouth daily. 30 tablet 0  . metFORMIN (GLUCOPHAGE) 1000 MG tablet Take 0.5 tablets (500 mg total) by mouth 2 (two) times daily with a meal. 180 tablet 3  . Multiple Vitamin (MULTIVITAMIN) tablet Take 1 tablet by mouth daily.     Marland Kitchen PANTOPRAZOLE SODIUM PO Take 1 tablet by mouth. Daily before meal    . saccharomyces boulardii (FLORASTOR) 250 MG capsule Take 250 mg by mouth 2 (two) times daily. For 17 weeks. Stop date: 04/14/15    . senna (SENOKOT) 8.6 MG tablet Take 2 tablets by mouth at bedtime.    . sitaGLIPtin (JANUVIA) 100 MG tablet Take 1 tablet (100 mg total) by mouth daily. 90 tablet 3  . sulfamethoxazole-trimethoprim (BACTRIM DS,SEPTRA DS) 800-160 MG tablet Take 1 tablet by mouth 2 (two) times daily. Until surgery day 03-08-15    . vitamin C (ASCORBIC ACID) 500 MG  tablet Take 500 mg by mouth daily.    . Zinc 50 MG CAPS Take 1 capsule by mouth daily.    Marland Kitchen acetaminophen (TYLENOL) 325 MG tablet Take 1 tablet (325 mg total) by mouth every 6 (six) hours as needed. For back pain. 30 tablet   . loperamide (IMODIUM) 2 MG capsule Take by mouth. Give 2 capsules (4 mg) by mouth initially (1st dose) per standing order. DO NOT EXCEED 16 MG/ 24HOUR PRN DIARRHEA      Results for orders placed or performed during the hospital encounter of 03-08-15 (from the past 48 hour(s))  I-STAT, chem 8     Status: Abnormal   Collection Time: 03/08/15  1:50 PM  Result Value Ref Range   Sodium 139 135 - 145 mmol/L   Potassium 5.2 (H) 3.5 - 5.1 mmol/L   Chloride 108 101 - 111 mmol/L   BUN 42 (H) 6 - 20 mg/dL   Creatinine, Ser 0.27 (H) 0.44 - 1.00 mg/dL   Glucose, Bld 88 65 - 99 mg/dL   Calcium, Ion 2.53 (H) 1.12 - 1.23 mmol/L   TCO2 22 0 - 100 mmol/L   Hemoglobin 9.5 (L) 12.0 - 15.0 g/dL   HCT 66.4 (L) 40.3 - 47.4 %   No results found.  ROS  No recent f/c/n/v/wt loss  Blood pressure 150/69, pulse 72, temperature 98.2 F (36.8 C), temperature source Oral, resp. rate 20, height 5\' 9"  (1.753 m), weight 122.641 kg (270 lb 6 oz), SpO2 100 %. Physical Exam  [age] wd woman in nad.  A and O x 4.  Mood and affect normal.  EOMI.  resp unlbored.  L foot with circular frame external fixator in place.  Moderate swelling about the foot but no cellulitis.  Brisk cap refill at the toes.  No lymphadenopathy.  Assessment/Plan L charcot foot s/p osteotomy and arthrodesis with thin wire circular frame - to OR for removal of ex fix and casting.  The risks and benefits of the alternative treatment options have been discussed in detail.  The patient wishes to proceed with surgery and specifically understands risks of bleeding, infection, nerve damage, blood clots, need for additional surgery, amputation and death.   , MD Mar 08, 2015, 2:45 PM

## 2015-02-20 ENCOUNTER — Encounter (HOSPITAL_BASED_OUTPATIENT_CLINIC_OR_DEPARTMENT_OTHER): Payer: Self-pay | Admitting: Orthopedic Surgery

## 2015-02-20 ENCOUNTER — Other Ambulatory Visit: Payer: Self-pay

## 2015-02-20 DIAGNOSIS — Z1231 Encounter for screening mammogram for malignant neoplasm of breast: Secondary | ICD-10-CM

## 2015-02-20 NOTE — Op Note (Signed)
NAMEROENA, SASSAMAN              ACCOUNT NO.:  [id number]  MEDICAL RECORD NO.:  [id number]  LOCATION:                                 FACILITY:  PHYSICIAN:  Toni Arthurs, MD        DATE OF BIRTH:  Nov 11, 1955  DATE OF PROCEDURE:  02/19/2015 DATE OF DISCHARGE:                              OPERATIVE REPORT   PREOPERATIVE DIAGNOSIS:  Left foot Charcot deformity, status post osteotomy and arthrodesis with circular frame external fixator.  POSTOPERATIVE DIAGNOSES: 1. Left foot Charcot deformity, status post osteotomy and arthrodesis     with circular frame external fixator. 2. Abscess of the left medial foot.  PROCEDURE: 1. Removal of left lower extremity external fixator under anesthesia. 2. Irrigation and excisional debridement of left foot abscess. 3. Application of short-leg cast to the left lower extremity.  SURGEON:  Toni Arthurs, M.D.  ANESTHESIA:  MAC.  ESTIMATED BLOOD LOSS:  Minimal.  TOURNIQUET TIME:  Zero.  COMPLICATIONS:  None apparent.  DISPOSITION:  Extubated, awake, and stable to Recovery.  INDICATIONS FOR PROCEDURE:  The patient is a 60 year old female with past medical history significant for diabetes and a severe Charcot rocker-bottom foot deformity with a plantar ulcer.  Just over 3 months ago, she underwent a corrective osteotomy of her midfoot with application of a thin wire circular frame external fixator for arthrodesis across the osteotomy site.  She has generally done well with the frame and presents today for removal under anesthesia.  She understands the risks and benefits, the alternative treatment options, and elects surgical treatment.  She specifically understands risks of bleeding, infection, nerve damage, blood clots, need for additional surgery, continued pain, nonunion, amputation, and death.  PROCEDURE IN DETAIL:  After preoperative consent was obtained and the correct operative site was identified, the patient was brought to  the operating room and placed supine on the operating table.  IV sedation was administered.  IV antibiotics were administered.  A surgical time- out was taken.  The left lower extremity was then prepped in a clean fashion.  The thin wires were all clipped with a pin cutter.  The circular frame was removed, leaving the pins in place.  On the medial side, all the pins were trimmed flush with the skin.  On the lateral side, all the pins were removed without difficulty.  Once all the pins were removed, the skin was cleaned.  There was an area of fluctuance at the medial midfoot identified.  This was probed and was found to be a superficial abscess.  The abscess was opened and sharply debrided.  Once the necrotic and nonviable tissue was excised with a rongeur, the wound was irrigated copiously.  A sterile dressing was applied, followed by cast padding and a well-padded short-leg cast.  The patient tolerated this procedure well with no evident complications.  The patient was awakened from anesthesia and transported to recovery room in stable condition.  FOLLOWUP PLAN:  The patient will be nonweightbearing on the left lower extremity.  She will follow up with me in the office in a week for x- rays and conversion to a CAM walker boot.     Toni Arthurs,  MD     JH/MEDQ  D:  02/19/2015  T:  02/19/2015  Job:  [id number]

## 2015-02-22 NOTE — Progress Notes (Signed)
Patient ID: [name], female   DOB: 08/22/1955, 60 y.o.   MRN: [id number]    DATE:  11/11/15  MRN:  [id number]  BIRTHDAY: Apr 26, 1955  Facility:  Nursing Home Location:  [facility]  Nursing Home Room Number: 901-1  LEVEL OF CARE:  SNF [phone])  Contact Information    Name Relation Home Work [place] Relative [phone]  [phone]   [name]   [phone]       Code Status History    Date Active Date Inactive Code Status Order ID Comments User Context   11/06/2014  2:26 PM 11/10/2014  9:05 PM Full Code [id number]  Watt Climes Inpatient   01/30/2013  5:08 PM 02/02/2013  8:03 PM Full Code [id number]  [name], MD Inpatient       Chief Complaint  Patient presents with  . Hospitalization Follow-up    Unsteady gait, Charcot left foot deformity S/P surgical repair, hyperlipidemia, anemia, chronic kidney disease is stage III, diabetes mellitus type 2, hypertension and GERD    HISTORY OF PRESENT ILLNESS:  This is a 60 year old female who has been admitted to [facility] on 11/10/14 from [facility]. She has charcot left foot deformity due to diabetes mellitus for which she had elective left talonavicular and calcaneocuboid arthrodesis with corrective osteotomy, heel cord lengthening and application of thin wire circular frame on 11/06/14. She has been admitted for a short-term rehabilitation.  PAST MEDICAL HISTORY:  Past Medical History  Diagnosis Date  . Normocytic anemia   . Hypertension   . Foot ulcer, left (HCC)   . Leg edema   . Glaucoma   . GERD (gastroesophageal reflux disease)   . High cholesterol   . Type II diabetes mellitus (HCC)   . H/O hiatal hernia   . Arthritis     "right leg" (01/30/2013)  . CKD (chronic kidney disease), stage III     [name] 01/30/2013  . Charcot foot due to diabetes mellitus (HCC)   . Acute blood loss anemia   . Slow transit constipation   . Hyperlipidemia     . OSA on CPAP     uses CPAP every night  . MRSA (methicillin resistant Staphylococcus aureus)     left foot, currently on Bactrim     CURRENT MEDICATIONS: Reviewed  Patient's Medications  New Prescriptions   No medications on file  Previous Medications   ACETAMINOPHEN (TYLENOL) 325 MG TABLET    Take 1 tablet (325 mg total) by mouth every 6 (six) hours as needed. For back pain.       ATORVASTATIN (LIPITOR) 40 MG TABLET    Take 1 tablet (40 mg total) by mouth daily.   BIOTIN 1000 MCG TABLET    Take 1,000 mcg by mouth daily.   CALCIUM-VITAMIN D (OSCAL-500) 500-400 MG-UNIT TABLET    Take 1 tablet by mouth daily.       FERROUS SULFATE 325 (65 FE) MG TABLET    Take 325 mg by mouth 1 (one) tablet daily.   LATANOPROST (XALATAN) 0.005 % OPHTHALMIC SOLUTION    Place 1 drop into both eyes at bedtime.       METFORMIN (GLUCOPHAGE) 1000 MG TABLET    Take 0.5 tablets (500 mg total) by mouth 2 (two) times daily with a meal.   MULTIPLE VITAMIN (MULTIVITAMIN) TABLET    Take 1 tablet by mouth daily.    PANTOPRAZOLE SODIUM PO    Take 1 tablet  by mouth. Daily before meal           SITAGLIPTIN (JANUVIA) 100 MG TABLET    Take 1 tablet (100 mg total) by mouth daily.   VITAMIN C (ASCORBIC ACID) 500 MG TABLET    Take 500 mg by mouth daily.   ZINC 50 MG CAPS    Take 1 capsule by mouth daily.  Modified Medications   Modified Medication Previous Medication   FUROSEMIDE (LASIX) 40 MG TABLET furosemide (LASIX) 40 MG tablet      TAKE 1 TABLET BY MOUTH DAILY    TAKE 1 TABLET (40 MG TOTAL) BY MOUTH DAILY.   LISINOPRIL (PRINIVIL,ZESTRIL) 20 MG TABLET lisinopril (PRINIVIL,ZESTRIL) 20 MG tablet      Take 1 tablet (20 mg total) by mouth daily.    TAKE 1 TABLET (20 MG TOTAL) BY MOUTH DAILY.     JANUVIA 100 MG TABLET Take 100 mg 1 tablet by mouth daily               HYDROMORPHONE (DILAUDID) 2 MG TABLET    Take 1-2 tablets (2-4 mg total) by mouth every 4 (four) hours as needed for severe pain.   LUMIGAN 0.01 %  SOLN    Place 1 drop into both eyes every evening.   PANTOPRAZOLE (PROTONIX) 40 MG TABLET    TAKE 1 TABLET BY MOUTH DAILY    Allergies  Allergen Reactions  . Codeine     REACTION: swelling in throat and itching  . Pravastatin Other (See Comments)    cramps     REVIEW OF SYSTEMS:  GENERAL: no change in appetite, no fatigue, no weight changes, no fever, chills or weakness EYES: Denies change in vision, dry eyes, eye pain, itching or discharge EARS: Denies change in hearing, ringing in ears, or earache NOSE: Denies nasal congestion or epistaxis MOUTH and THROAT: Denies oral discomfort, gingival pain or bleeding, pain from teeth or hoarseness   RESPIRATORY: no cough, SOB, DOE, wheezing, hemoptysis CARDIAC: no chest pain, or palpitations GI: no abdominal pain, diarrhea, constipation, heart burn, nausea or vomiting GU: Denies dysuria, frequency, hematuria, incontinence, or discharge PSYCHIATRIC: Denies feeling of depression or anxiety. No report of hallucinations, insomnia, paranoia, or agitation   PHYSICAL EXAMINATION  GENERAL APPEARANCE: Well nourished. In no acute distress. Normal body habitus SKIN:  Warm and dry HEAD: Normal in size and contour. No evidence of trauma EYES: Lids open and close normally. No blepharitis, entropion or ectropion. PERRL. Conjunctivae are clear and sclerae are white. Lenses are without opacity EARS: Pinnae are normal. Patient hears normal voice tunes of the examiner MOUTH and THROAT: Lips are without lesions. Oral mucosa is moist and without lesions. Tongue is normal in shape, size, and color and without lesions NECK: supple, trachea midline, no neck masses, no thyroid tenderness, no thyromegaly LYMPHATICS: no LAN in the neck, no supraclavicular LAN RESPIRATORY: breathing is even & unlabored, BS CTAB CARDIAC: RRR, no murmur,no extra heart sounds, no edema GI: abdomen soft, normal BS, no masses, no tenderness, no hepatomegaly, no  splenomegaly EXTREMITIES:  Able to move X 4 extremities; left foot with pins and rods PSYCHIATRIC: Alert and oriented X 3. Affect and behavior are appropriate  LABS/RADIOLOGY: Labs reviewed: Basic Metabolic Panel:  Recent Labs  06/77/03 1154   NA 138   K 5.0   CL 105   CO2 26   GLUCOSE 96   BUN 28*   CREATININE 1.67*   CALCIUM 9.4    Liver FunCBC:  Recent Labs  10/29/14 1154     WBC 7.5     HGB 10.1*     HCT 31.5*     MCV 85.1     PLT 241      CBG:  Recent Labs  11/10/14 1110 11/10/14 1724   GLUCAP 103* 91      ASSESSMENT/PLAN:  Unsteady gait - for rehabilitation; fall precaution  Charcot left foot deformity due to diabetes S/P surgical repair - for rehabilitation; LLE  NWB; continue Tylenol 325 mg 1 tab by mouth every 6 hours when necessary and Dilaudid 2 mg 1-2 tabs by mouth every 4 hours when necessary for pain; follow-up with [name], orthopedic surgeon, in 2 weeks  Anemia, acute blood loss - hemoglobin 10.1; continue ferrous sulfate 325 mg 1 tab by mouth daily  Hyperlipidemia - continue Lipitor 40 mg 1 tab by mouth daily  Chronic kidney disease is stage III - creatinine 1.67; will monitor  Diabetes mellitus, type II - hemoglobin A1c 7.2; continue metformin 1000 mg 1/2 tab = 500 mg by mouth twice a day and Januvia 100 mg 1 tab by mouth daily  Hypertension - continue Lasix 40 mg 1 tab by mouth daily and lisinopril 20 mg 1 tab by mouth daily  GERD - continue Protonix 40 mg 1 tab by mouth daily      Goals of care:  Short-term rehabilitation    [facility], NP [facility] [phone]

## 2015-02-27 DIAGNOSIS — R262 Difficulty in walking, not elsewhere classified: Secondary | ICD-10-CM | POA: Diagnosis not present

## 2015-02-27 DIAGNOSIS — M25572 Pain in left ankle and joints of left foot: Secondary | ICD-10-CM | POA: Diagnosis not present

## 2015-02-27 DIAGNOSIS — Z4789 Encounter for other orthopedic aftercare: Secondary | ICD-10-CM | POA: Diagnosis not present

## 2015-02-27 DIAGNOSIS — R2681 Unsteadiness on feet: Secondary | ICD-10-CM | POA: Diagnosis not present

## 2015-02-27 DIAGNOSIS — M6281 Muscle weakness (generalized): Secondary | ICD-10-CM | POA: Diagnosis not present

## 2015-02-27 DIAGNOSIS — Z981 Arthrodesis status: Secondary | ICD-10-CM | POA: Diagnosis not present

## 2015-02-27 DIAGNOSIS — L02612 Cutaneous abscess of left foot: Secondary | ICD-10-CM | POA: Diagnosis not present

## 2015-03-02 DIAGNOSIS — R262 Difficulty in walking, not elsewhere classified: Secondary | ICD-10-CM | POA: Diagnosis not present

## 2015-03-02 DIAGNOSIS — L02612 Cutaneous abscess of left foot: Secondary | ICD-10-CM | POA: Diagnosis not present

## 2015-03-02 DIAGNOSIS — Z981 Arthrodesis status: Secondary | ICD-10-CM | POA: Diagnosis not present

## 2015-03-02 DIAGNOSIS — M6281 Muscle weakness (generalized): Secondary | ICD-10-CM | POA: Diagnosis not present

## 2015-03-02 DIAGNOSIS — R2681 Unsteadiness on feet: Secondary | ICD-10-CM | POA: Diagnosis not present

## 2015-03-02 DIAGNOSIS — M25572 Pain in left ankle and joints of left foot: Secondary | ICD-10-CM | POA: Diagnosis not present

## 2015-03-06 ENCOUNTER — Non-Acute Institutional Stay (SKILLED_NURSING_FACILITY): Payer: Medicare Other | Admitting: Adult Health

## 2015-03-06 ENCOUNTER — Encounter: Payer: Self-pay | Admitting: Adult Health

## 2015-03-06 DIAGNOSIS — I10 Essential (primary) hypertension: Secondary | ICD-10-CM

## 2015-03-06 DIAGNOSIS — D62 Acute posthemorrhagic anemia: Secondary | ICD-10-CM

## 2015-03-06 DIAGNOSIS — N183 Chronic kidney disease, stage 3 unspecified: Secondary | ICD-10-CM

## 2015-03-06 DIAGNOSIS — E785 Hyperlipidemia, unspecified: Secondary | ICD-10-CM

## 2015-03-06 DIAGNOSIS — E1122 Type 2 diabetes mellitus with diabetic chronic kidney disease: Secondary | ICD-10-CM

## 2015-03-06 DIAGNOSIS — G4733 Obstructive sleep apnea (adult) (pediatric): Secondary | ICD-10-CM

## 2015-03-06 DIAGNOSIS — R2681 Unsteadiness on feet: Secondary | ICD-10-CM

## 2015-03-06 DIAGNOSIS — K219 Gastro-esophageal reflux disease without esophagitis: Secondary | ICD-10-CM | POA: Diagnosis not present

## 2015-03-06 DIAGNOSIS — K5901 Slow transit constipation: Secondary | ICD-10-CM

## 2015-03-06 DIAGNOSIS — E11610 Type 2 diabetes mellitus with diabetic neuropathic arthropathy: Secondary | ICD-10-CM | POA: Diagnosis not present

## 2015-03-06 NOTE — Progress Notes (Deleted)
Patient ID: [name], female   DOB: [birth year], 60 y.o.   MRN: [id number]    DATE:    03/06/15  MRN:  [id number]  BIRTHDAY: April 14, 1955  Facility:  Nursing Home Location:  [facility]  Nursing Home Room Number: 902-P  LEVEL OF CARE:  SNF [phone]  Contact Information    Name Relation Home Work [name] [phone]  [phone]   [name]   [phone]       Code Status History    Date Active Date Inactive Code Status Order ID Comments User Context   11/06/2014  2:26 PM 11/10/2014  9:05 PM Full Code [id number]  Watt Climes Inpatient   01/30/2013  5:08 PM 02/02/2013  8:03 PM Full Code [id number]  [name], MD Inpatient       Chief Complaint  Patient presents with  . Medical Management of Chronic Issues    HISTORY OF PRESENT ILLNESS:  This is a 60 year old female who is being seen for a routine visit. She is currently having short-term rehabilitation at [facility] due to having Charcot left foot deformity for which she had surgical repair on 11/06/15.   Latest  Albumin noted to be 2.85, severely low. External fixator on left foot has been removed and replaced with cast. LLE is WBAT. She is on ASA 325 mg daily for DVT prophylaxis.   PAST MEDICAL HISTORY:  Past Medical History  Diagnosis Date  . Normocytic anemia   . Hypertension   . Foot ulcer, left (HCC)   . Leg edema   . Glaucoma   . GERD (gastroesophageal reflux disease)   . High cholesterol   . Type II diabetes mellitus (HCC)   . H/O hiatal hernia   . Arthritis     "right leg" (01/30/2013)  . CKD (chronic kidney disease), stage III     [name] 01/30/2013  . Charcot foot due to diabetes mellitus (HCC)   . Acute blood loss anemia   . Slow transit constipation   . Hyperlipidemia   . OSA on CPAP     uses CPAP every night  . MRSA (methicillin resistant Staphylococcus aureus)     left foot, currently on Bactrim     CURRENT  MEDICATIONS: Reviewed    Medication List       This list is accurate as of: 03/06/15 11:59 PM.  Always use your most recent med list.               TYLENOL PO  Take 1,000 mg by mouth every 6 (six) hours as needed.     acetaminophen 325 MG tablet  Commonly known as:  TYLENOL  Take 1 tablet (325 mg total) by mouth every 6 (six) hours as needed. For back pain.     aspirin 325 MG EC tablet  Take 325 mg by mouth daily.     atorvastatin 40 MG tablet  Commonly known as:  LIPITOR  Take 1 tablet (40 mg total) by mouth daily.     Biotin 1000 MCG tablet  Take 1,000 mcg by mouth daily.     calcium-vitamin D 500-400 MG-UNIT tablet  Commonly known as:  OSCAL-500  Take 1 tablet by mouth 2 (two) times daily.     COMBIGAN 0.2-0.5 % ophthalmic solution  Generic drug:  brimonidine-timolol  Place one drop to both eyes twice daily for glaucoma     ferrous sulfate 325 (65 FE) MG tablet  Take 325  mg by mouth 2 (two) times daily.     furosemide 40 MG tablet  Commonly known as:  LASIX  TAKE 1 TABLET BY MOUTH DAILY     latanoprost 0.005 % ophthalmic solution  Commonly known as:  XALATAN  Place 1 drop into both eyes at bedtime.     lisinopril 10 MG tablet  Commonly known as:  PRINIVIL,ZESTRIL  Take 1 tablet (10 mg total) by mouth daily.     loperamide 2 MG capsule  Commonly known as:  IMODIUM  Take by mouth. Give 2 capsules (4 mg) by mouth initially (1st dose) per standing order. DO NOT EXCEED 16 MG/ 24HOUR PRN DIARRHEA     metFORMIN 1000 MG tablet  Commonly known as:  GLUCOPHAGE  Take 0.5 tablets (500 mg total) by mouth 2 (two) times daily with a meal.     methocarbamol 500 MG tablet  Commonly known as:  ROBAXIN  Take 500 mg by mouth every 6 (six) hours as needed for muscle spasms.     multivitamin tablet  Take 1 tablet by mouth daily.     pantoprazole 40 MG tablet  Commonly known as:  PROTONIX  Take 40 mg by mouth daily.     saccharomyces boulardii 250 MG capsule   Commonly known as:  FLORASTOR  Take 250 mg by mouth 2 (two) times daily. For 17 weeks. Stop date: 04/14/15     sennosides-docusate sodium 8.6-50 MG tablet  Commonly known as:  SENOKOT-S  Take 1 tablet by mouth at bedtime.     sitaGLIPtin 100 MG tablet  Commonly known as:  JANUVIA  Take 1 tablet (100 mg total) by mouth daily.     vitamin C 500 MG tablet  Commonly known as:  ASCORBIC ACID  Take 500 mg by mouth daily.     Zinc 50 MG Caps  Take 1 capsule by mouth daily.         Allergies  Allergen Reactions  . Codeine     REACTION: swelling in throat and itching  . Pravastatin Other (See Comments)    cramps     REVIEW OF SYSTEMS:  GENERAL: no change in appetite, no fatigue, no weight changes, no fever, chills or weakness EYES: Denies change in vision, dry eyes, eye pain, itching or discharge EARS: Denies change in hearing, ringing in ears, or earache NOSE: Denies nasal congestion or epistaxis MOUTH and THROAT: Denies oral discomfort, gingival pain or bleeding, pain from teeth or hoarseness   RESPIRATORY: no cough, SOB, DOE, wheezing, hemoptysis CARDIAC: no chest pain, or palpitations GI: no abdominal pain, diarrhea, constipation, heart burn, nausea or vomiting GU: Denies dysuria, frequency, hematuria, incontinence, or discharge PSYCHIATRIC: Denies feeling of depression or anxiety. No report of hallucinations, insomnia, paranoia, or agitation   PHYSICAL EXAMINATION  GENERAL APPEARANCE: Well nourished. In no acute distress. Normal body habitus SKIN:  Skin is warm and dry.  HEAD: Normal in size and contour. No evidence of trauma EYES: Lids open and close normally. No blepharitis, entropion or ectropion. PERRL. Conjunctivae are clear and sclerae are white. Lenses are without opacity EARS: Pinnae are normal. Patient hears normal voice tunes of the examiner MOUTH and THROAT: Lips are without lesions. Oral mucosa is moist and without lesions. Tongue is normal in shape,  size, and color and without lesions NECK: supple, trachea midline, no neck masses, no thyroid tenderness, no thyromegaly LYMPHATICS: no LAN in the neck, no supraclavicular LAN RESPIRATORY: breathing is even & unlabored, BS CTAB CARDIAC:  RRR, no murmur,no extra heart sounds, no edema GI: abdomen soft, normal BS, no masses, no tenderness, no hepatomegaly, no splenomegaly EXTREMITIES:  Able to move X 4 extremities; left foot has cast PSYCHIATRIC: Alert and oriented X 3. Affect and behavior are appropriate  LABS/RADIOLOGY: Labs reviewed: Basic Metabolic Panel:  Recent Labs  28/63/81 1154 02/19/15 1350  NA 138 139  K 5.0 5.2*  CL 105 108  CO2 26  --   GLUCOSE 96 88  BUN 28* 42*  CREATININE 1.67* 2.30*  CALCIUM 9.4  --    Liver Function Tests:  Recent Labs  02/06/15  AST 13  ALT 6*  ALKPHOS 65    CBC:  Recent Labs  10/29/14 1154 01/14/15 01/20/15 02/19/15 1350  WBC 7.5 9.6 9.5  --   HGB 10.1* 7.1* 7.8* 9.5*  HCT 31.5*  --  24* 28.0*  MCV 85.1  --   --   --   PLT 241 242 392  --    Lipid Panel:  Recent Labs  02/06/15  HDL 34*   CBG:  Recent Labs  11/10/14 1110 11/10/14 1724 02/19/15 1551  GLUCAP 103* 91 78       ASSESSMENT/PLAN:  Unsteady gait - continue rehabilitation; fall precaution  Charcot left foot deformity S/P surgical repair -  continue rehabilitation;  aspirin EC 325 mg 1 tab by mouth daily for DVT prophylaxis; Tylenol 1000 mg by mouth every 6 hours when necessary for pain; Biaxin 500 mg 1 tab by mouth every 6 hours when necessary for muscle spasm; LLE WBAT; follow-up with [name], orthopedic surgeon  Protein calorie malnutrition, severe - albumin 2.85; start Procel 2 scoops by mouth twice a day  Anemia, acute blood loss - hemoglobin 9.5; continue ferrous sulfate 325 mg 1 tab by mouth twice a day; check CBC  Chronic kidney disease is stage III  - creatinine 1.67; recheck 2.30, check BMP  Constipation - continue senna S2 tabs by mouth  daily at bedtime  OSA - continue CPAP machine  Hyperlipidemia - continue Lipitor 40 mg 1 tab by mouth daily Cholesterol 91  LDL 45  HDL 33.6  regular is right 64  Diabetes mellitus, type II - continue metformin 1000 mg 1/2 TAB = 500 mg PO BID and the new via 100 mg 1 tab by mouth daily  GERD - stable; continue Protonix 40 mg 1 tab by mouth daily  Hypertension - well-controlled; continue Lasix 40 mg 1 tab by mouth daily and lisinopril 10 mg 1 tab by mouth daily    Goals of care:  Short-term rehabilitation    [facility], NP [facility] [phone]

## 2015-03-09 LAB — CBC AND DIFFERENTIAL
HEMATOCRIT: 26 % — AB (ref 36–46)
HEMOGLOBIN: 8.3 g/dL — AB (ref 12.0–16.0)
Platelets: 225 10*3/uL (ref 150–399)
WBC: 6.7 10*3/mL

## 2015-03-09 LAB — BASIC METABOLIC PANEL
BUN: 42 mg/dL — AB (ref 4–21)
CREATININE: 1.8 mg/dL — AB (ref 0.5–1.1)
Glucose: 97 mg/dL
POTASSIUM: 5 mmol/L (ref 3.4–5.3)
SODIUM: 140 mmol/L (ref 137–147)

## 2015-03-10 DIAGNOSIS — M14672 Charcot's joint, left ankle and foot: Secondary | ICD-10-CM | POA: Diagnosis not present

## 2015-03-10 DIAGNOSIS — R278 Other lack of coordination: Secondary | ICD-10-CM | POA: Diagnosis not present

## 2015-03-10 DIAGNOSIS — E11610 Type 2 diabetes mellitus with diabetic neuropathic arthropathy: Secondary | ICD-10-CM | POA: Diagnosis not present

## 2015-03-10 DIAGNOSIS — R2681 Unsteadiness on feet: Secondary | ICD-10-CM | POA: Diagnosis not present

## 2015-03-10 DIAGNOSIS — I10 Essential (primary) hypertension: Secondary | ICD-10-CM | POA: Diagnosis not present

## 2015-03-10 DIAGNOSIS — M25572 Pain in left ankle and joints of left foot: Secondary | ICD-10-CM | POA: Diagnosis not present

## 2015-03-10 DIAGNOSIS — M6281 Muscle weakness (generalized): Secondary | ICD-10-CM | POA: Diagnosis not present

## 2015-03-10 DIAGNOSIS — Z981 Arthrodesis status: Secondary | ICD-10-CM | POA: Diagnosis not present

## 2015-03-10 DIAGNOSIS — R262 Difficulty in walking, not elsewhere classified: Secondary | ICD-10-CM | POA: Diagnosis not present

## 2015-03-10 DIAGNOSIS — L02612 Cutaneous abscess of left foot: Secondary | ICD-10-CM | POA: Diagnosis not present

## 2015-03-11 DIAGNOSIS — I10 Essential (primary) hypertension: Secondary | ICD-10-CM | POA: Diagnosis not present

## 2015-03-11 DIAGNOSIS — R278 Other lack of coordination: Secondary | ICD-10-CM | POA: Diagnosis not present

## 2015-03-11 DIAGNOSIS — M14672 Charcot's joint, left ankle and foot: Secondary | ICD-10-CM | POA: Diagnosis not present

## 2015-03-11 DIAGNOSIS — R2681 Unsteadiness on feet: Secondary | ICD-10-CM | POA: Diagnosis not present

## 2015-03-11 DIAGNOSIS — E11610 Type 2 diabetes mellitus with diabetic neuropathic arthropathy: Secondary | ICD-10-CM | POA: Diagnosis not present

## 2015-03-12 ENCOUNTER — Ambulatory Visit
Admission: RE | Admit: 2015-03-12 | Discharge: 2015-03-12 | Disposition: A | Discharge status: Home / Self Care | Payer: Medicare Other | Source: Ambulatory Visit

## 2015-03-12 DIAGNOSIS — Z1231 Encounter for screening mammogram for malignant neoplasm of breast: Secondary | ICD-10-CM | POA: Diagnosis not present

## 2015-03-12 DIAGNOSIS — E11610 Type 2 diabetes mellitus with diabetic neuropathic arthropathy: Secondary | ICD-10-CM | POA: Diagnosis not present

## 2015-03-12 DIAGNOSIS — I10 Essential (primary) hypertension: Secondary | ICD-10-CM | POA: Diagnosis not present

## 2015-03-12 DIAGNOSIS — R2681 Unsteadiness on feet: Secondary | ICD-10-CM | POA: Diagnosis not present

## 2015-03-12 DIAGNOSIS — M14672 Charcot's joint, left ankle and foot: Secondary | ICD-10-CM | POA: Diagnosis not present

## 2015-03-12 DIAGNOSIS — R278 Other lack of coordination: Secondary | ICD-10-CM | POA: Diagnosis not present

## 2015-03-12 NOTE — Progress Notes (Signed)
Patient ID: [name], female   DOB: 01/28/1955, 59 y.o.   MRN: [id number]    DATE:    03/06/15  MRN:  [id number]  BIRTHDAY: 10/04/1955  Facility:  Nursing Home Location:  [facility]  Nursing Home Room Number: 902-P  LEVEL OF CARE:  SNF (31)  Contact Information    Name Relation Home Work Mobile   Walker,Patricia Relative [phone]  [phone]   Lowe,Pauline Friend   [phone]       Code Status History    Date Active Date Inactive Code Status Order ID Comments User Context   11/06/2014  2:26 PM 11/10/2014  9:05 PM Full Code [id number]  Justin Pike Ollis, PA-C Inpatient   01/30/2013  5:08 PM 02/02/2013  8:03 PM Full Code [id number]  Samaya J Qureshi, MD Inpatient       Chief Complaint  Patient presents with  . Medical Management of Chronic Issues    HISTORY OF PRESENT ILLNESS:  This is a 60-year-old female who is being seen for a routine visit. She is currently having short-term rehabilitation at [facility] due to having Charcot left foot deformity for which she had surgical repair on 11/06/15.   Latest  Albumin noted to be 2.85, severely low. External fixator on left foot has been removed and replaced with cast. LLE is WBAT. She is on ASA 325 mg daily for DVT prophylaxis.   PAST MEDICAL HISTORY:  Past Medical History  Diagnosis Date  . Normocytic anemia   . Hypertension   . Foot ulcer, left (HCC)   . Leg edema   . Glaucoma   . GERD (gastroesophageal reflux disease)   . High cholesterol   . Type II diabetes mellitus (HCC)   . H/O hiatal hernia   . Arthritis     "right leg" (01/30/2013)  . CKD (chronic kidney disease), stage III     /notes 01/30/2013  . Charcot foot due to diabetes mellitus (HCC)   . Acute blood loss anemia   . Slow transit constipation   . Hyperlipidemia   . OSA on CPAP     uses CPAP every night  . MRSA (methicillin resistant Staphylococcus aureus)     left foot, currently on Bactrim     CURRENT  MEDICATIONS: Reviewed    Medication List       This list is accurate as of: 03/06/15 11:59 PM.  Always use your most recent med list.               TYLENOL PO  Take 1,000 mg by mouth every 6 (six) hours as needed.     acetaminophen 325 MG tablet  Commonly known as:  TYLENOL  Take 1 tablet (325 mg total) by mouth every 6 (six) hours as needed. For back pain.     aspirin 325 MG EC tablet  Take 325 mg by mouth daily.     atorvastatin 40 MG tablet  Commonly known as:  LIPITOR  Take 1 tablet (40 mg total) by mouth daily.     Biotin 1000 MCG tablet  Take 1,000 mcg by mouth daily.     calcium-vitamin D 500-400 MG-UNIT tablet  Commonly known as:  OSCAL-500  Take 1 tablet by mouth 2 (two) times daily.     COMBIGAN 0.2-0.5 % ophthalmic solution  Generic drug:  brimonidine-timolol  Place one drop to both eyes twice daily for glaucoma     ferrous sulfate 325 (65 FE) MG tablet  Take 325   tablet  Take 325 mg by mouth 2 (two) times daily.     furosemide 40 MG tablet  Commonly known as:  LASIX  TAKE 1 TABLET BY MOUTH DAILY     latanoprost 0.005 % ophthalmic solution  Commonly known as:  XALATAN  Place 1 drop into both eyes at bedtime.     lisinopril 10 MG tablet  Commonly known as:  PRINIVIL,ZESTRIL  Take 1 tablet (10 mg total) by mouth daily.     loperamide 2 MG capsule  Commonly known as:  IMODIUM  Take by mouth. Give 2 capsules (4 mg) by mouth initially (1st dose) per standing order. DO NOT EXCEED 16 MG/ 24HOUR PRN DIARRHEA     metFORMIN 1000 MG tablet  Commonly known as:  GLUCOPHAGE  Take 0.5 tablets (500 mg total) by mouth 2 (two) times daily with a meal.     methocarbamol 500 MG tablet  Commonly known as:  ROBAXIN  Take 500 mg by mouth every 6 (six) hours as needed for muscle spasms.     multivitamin tablet  Take 1 tablet by mouth daily.     pantoprazole 40 MG tablet  Commonly known as:  PROTONIX  Take 40 mg by mouth daily.     saccharomyces boulardii 250 MG capsule    Commonly known as:  FLORASTOR  Take 250 mg by mouth 2 (two) times daily. For 17 weeks. Stop date: 04/14/15     sennosides-docusate sodium 8.6-50 MG tablet  Commonly known as:  SENOKOT-S  Take 1 tablet by mouth at bedtime.     sitaGLIPtin 100 MG tablet  Commonly known as:  JANUVIA  Take 1 tablet (100 mg total) by mouth daily.     vitamin C 500 MG tablet  Commonly known as:  ASCORBIC ACID  Take 500 mg by mouth daily.     Zinc 50 MG Caps  Take 1 capsule by mouth daily.         Allergies  Allergen Reactions  . Codeine     REACTION: swelling in throat and itching  . Pravastatin Other (See Comments)    cramps     REVIEW OF SYSTEMS:  GENERAL: no change in appetite, no fatigue, no weight changes, no fever, chills or weakness EYES: Denies change in vision, dry eyes, eye pain, itching or discharge EARS: Denies change in hearing, ringing in ears, or earache NOSE: Denies nasal congestion or epistaxis MOUTH and THROAT: Denies oral discomfort, gingival pain or bleeding, pain from teeth or hoarseness   RESPIRATORY: no cough, SOB, DOE, wheezing, hemoptysis CARDIAC: no chest pain, or palpitations GI: no abdominal pain, diarrhea, constipation, heart burn, nausea or vomiting GU: Denies dysuria, frequency, hematuria, incontinence, or discharge PSYCHIATRIC: Denies feeling of depression or anxiety. No report of hallucinations, insomnia, paranoia, or agitation   PHYSICAL EXAMINATION  GENERAL APPEARANCE: Well nourished. In no acute distress. Normal body habitus SKIN:  Skin is warm and dry.  HEAD: Normal in size and contour. No evidence of trauma EYES: Lids open and close normally. No blepharitis, entropion or ectropion. PERRL. Conjunctivae are clear and sclerae are white. Lenses are without opacity EARS: Pinnae are normal. Patient hears normal voice tunes of the examiner MOUTH and THROAT: Lips are without lesions. Oral mucosa is moist and without lesions. Tongue is normal in shape,  size, and color and without lesions NECK: supple, trachea midline, no neck masses, no thyroid tenderness, no thyromegaly LYMPHATICS: no LAN in the neck, no supraclavicular LAN RESPIRATORY: breathing is even &  RRR, no murmur,no extra heart sounds, no edema GI: abdomen soft, normal BS, no masses, no tenderness, no hepatomegaly, no splenomegaly EXTREMITIES:  Able to move X 4 extremities; left foot has cast PSYCHIATRIC: Alert and oriented X 3. Affect and behavior are appropriate  LABS/RADIOLOGY: Labs reviewed: Basic Metabolic Panel:  Recent Labs  10/29/14 1154 02/19/15 1350  NA 138 139  K 5.0 5.2*  CL 105 108  CO2 26  --   GLUCOSE 96 88  BUN 28* 42*  CREATININE 1.67* 2.30*  CALCIUM 9.4  --    Liver Function Tests:  Recent Labs  02/06/15  AST 13  ALT 6*  ALKPHOS 65    CBC:  Recent Labs  10/29/14 1154 01/14/15 01/20/15 02/19/15 1350  WBC 7.5 9.6 9.5  --   HGB 10.1* 7.1* 7.8* 9.5*  HCT 31.5*  --  24* 28.0*  MCV 85.1  --   --   --   PLT 241 242 392  --    Lipid Panel:  Recent Labs  02/06/15  HDL 34*   CBG:  Recent Labs  11/10/14 1110 11/10/14 1724 02/19/15 1551  GLUCAP 103* 91 78       ASSESSMENT/PLAN:  Unsteady gait - continue rehabilitation; fall precaution  Charcot left foot deformity S/P surgical repair -  continue rehabilitation;  aspirin EC 325 mg 1 tab by mouth daily for DVT prophylaxis; Tylenol 1000 mg by mouth every 6 hours when necessary for pain; Biaxin 500 mg 1 tab by mouth every 6 hours when necessary for muscle spasm; LLE WBAT; follow-up with Dr. Hewitt, orthopedic surgeon  Protein calorie malnutrition, severe - albumin 2.85; start Procel 2 scoops by mouth twice a day  Anemia, acute blood loss - hemoglobin 9.5; continue ferrous sulfate 325 mg 1 tab by mouth twice a day; check CBC  Chronic kidney disease is stage III  - creatinine 1.67; recheck 2.30, check BMP  Constipation - continue senna S2 tabs by mouth  daily at bedtime  OSA - continue CPAP machine  Hyperlipidemia - continue Lipitor 40 mg 1 tab by mouth daily Cholesterol 91  LDL 45  HDL 33.6  regular is right 64  Diabetes mellitus, type II - continue metformin 1000 mg 1/2 TAB = 500 mg PO BID and the new via 100 mg 1 tab by mouth daily  GERD - stable; continue Protonix 40 mg 1 tab by mouth daily  Hypertension - well-controlled; continue Lasix 40 mg 1 tab by mouth daily and lisinopril 10 mg 1 tab by mouth daily    Goals of care:  Short-term rehabilitation    MEDINA-VARGAS,MONINA, NP [facility] [phone]  

## 2015-03-13 DIAGNOSIS — Z981 Arthrodesis status: Secondary | ICD-10-CM | POA: Diagnosis not present

## 2015-03-13 DIAGNOSIS — M14672 Charcot's joint, left ankle and foot: Secondary | ICD-10-CM | POA: Diagnosis not present

## 2015-03-13 DIAGNOSIS — M6281 Muscle weakness (generalized): Secondary | ICD-10-CM | POA: Diagnosis not present

## 2015-03-13 DIAGNOSIS — Z4789 Encounter for other orthopedic aftercare: Secondary | ICD-10-CM | POA: Diagnosis not present

## 2015-03-13 DIAGNOSIS — R278 Other lack of coordination: Secondary | ICD-10-CM | POA: Diagnosis not present

## 2015-03-13 DIAGNOSIS — L02612 Cutaneous abscess of left foot: Secondary | ICD-10-CM | POA: Diagnosis not present

## 2015-03-13 DIAGNOSIS — E11610 Type 2 diabetes mellitus with diabetic neuropathic arthropathy: Secondary | ICD-10-CM | POA: Diagnosis not present

## 2015-03-13 DIAGNOSIS — I10 Essential (primary) hypertension: Secondary | ICD-10-CM | POA: Diagnosis not present

## 2015-03-13 DIAGNOSIS — R2681 Unsteadiness on feet: Secondary | ICD-10-CM | POA: Diagnosis not present

## 2015-03-13 DIAGNOSIS — M25572 Pain in left ankle and joints of left foot: Secondary | ICD-10-CM | POA: Diagnosis not present

## 2015-03-13 DIAGNOSIS — R262 Difficulty in walking, not elsewhere classified: Secondary | ICD-10-CM | POA: Diagnosis not present

## 2015-03-16 DIAGNOSIS — L02612 Cutaneous abscess of left foot: Secondary | ICD-10-CM | POA: Diagnosis not present

## 2015-03-16 DIAGNOSIS — M6281 Muscle weakness (generalized): Secondary | ICD-10-CM | POA: Diagnosis not present

## 2015-03-16 DIAGNOSIS — Z981 Arthrodesis status: Secondary | ICD-10-CM | POA: Diagnosis not present

## 2015-03-16 DIAGNOSIS — R2681 Unsteadiness on feet: Secondary | ICD-10-CM | POA: Diagnosis not present

## 2015-03-16 DIAGNOSIS — I10 Essential (primary) hypertension: Secondary | ICD-10-CM | POA: Diagnosis not present

## 2015-03-16 DIAGNOSIS — R278 Other lack of coordination: Secondary | ICD-10-CM | POA: Diagnosis not present

## 2015-03-16 DIAGNOSIS — M25572 Pain in left ankle and joints of left foot: Secondary | ICD-10-CM | POA: Diagnosis not present

## 2015-03-16 DIAGNOSIS — M14672 Charcot's joint, left ankle and foot: Secondary | ICD-10-CM | POA: Diagnosis not present

## 2015-03-16 DIAGNOSIS — R262 Difficulty in walking, not elsewhere classified: Secondary | ICD-10-CM | POA: Diagnosis not present

## 2015-03-16 DIAGNOSIS — E11610 Type 2 diabetes mellitus with diabetic neuropathic arthropathy: Secondary | ICD-10-CM | POA: Diagnosis not present

## 2015-03-17 DIAGNOSIS — R2681 Unsteadiness on feet: Secondary | ICD-10-CM | POA: Diagnosis not present

## 2015-03-17 DIAGNOSIS — I10 Essential (primary) hypertension: Secondary | ICD-10-CM | POA: Diagnosis not present

## 2015-03-17 DIAGNOSIS — R278 Other lack of coordination: Secondary | ICD-10-CM | POA: Diagnosis not present

## 2015-03-17 DIAGNOSIS — M14672 Charcot's joint, left ankle and foot: Secondary | ICD-10-CM | POA: Diagnosis not present

## 2015-03-17 DIAGNOSIS — E11610 Type 2 diabetes mellitus with diabetic neuropathic arthropathy: Secondary | ICD-10-CM | POA: Diagnosis not present

## 2015-03-18 DIAGNOSIS — E11610 Type 2 diabetes mellitus with diabetic neuropathic arthropathy: Secondary | ICD-10-CM | POA: Diagnosis not present

## 2015-03-18 DIAGNOSIS — R278 Other lack of coordination: Secondary | ICD-10-CM | POA: Diagnosis not present

## 2015-03-18 DIAGNOSIS — R2681 Unsteadiness on feet: Secondary | ICD-10-CM | POA: Diagnosis not present

## 2015-03-18 DIAGNOSIS — I10 Essential (primary) hypertension: Secondary | ICD-10-CM | POA: Diagnosis not present

## 2015-03-18 DIAGNOSIS — D649 Anemia, unspecified: Secondary | ICD-10-CM | POA: Diagnosis not present

## 2015-03-18 LAB — BASIC METABOLIC PANEL
BUN: 48 mg/dL — AB (ref 4–21)
Creatinine: 1.8 mg/dL — AB (ref 0.5–1.1)
GLUCOSE: 103 mg/dL
Potassium: 4.7 mmol/L (ref 3.4–5.3)
Sodium: 136 mmol/L — AB (ref 137–147)

## 2015-03-19 DIAGNOSIS — R2681 Unsteadiness on feet: Secondary | ICD-10-CM | POA: Diagnosis not present

## 2015-03-19 DIAGNOSIS — R278 Other lack of coordination: Secondary | ICD-10-CM | POA: Diagnosis not present

## 2015-03-19 DIAGNOSIS — E11610 Type 2 diabetes mellitus with diabetic neuropathic arthropathy: Secondary | ICD-10-CM | POA: Diagnosis not present

## 2015-03-19 DIAGNOSIS — I10 Essential (primary) hypertension: Secondary | ICD-10-CM | POA: Diagnosis not present

## 2015-03-20 DIAGNOSIS — M25572 Pain in left ankle and joints of left foot: Secondary | ICD-10-CM | POA: Diagnosis not present

## 2015-03-20 DIAGNOSIS — M6281 Muscle weakness (generalized): Secondary | ICD-10-CM | POA: Diagnosis not present

## 2015-03-20 DIAGNOSIS — E11610 Type 2 diabetes mellitus with diabetic neuropathic arthropathy: Secondary | ICD-10-CM | POA: Diagnosis not present

## 2015-03-20 DIAGNOSIS — R2681 Unsteadiness on feet: Secondary | ICD-10-CM | POA: Diagnosis not present

## 2015-03-20 DIAGNOSIS — R278 Other lack of coordination: Secondary | ICD-10-CM | POA: Diagnosis not present

## 2015-03-20 DIAGNOSIS — R262 Difficulty in walking, not elsewhere classified: Secondary | ICD-10-CM | POA: Diagnosis not present

## 2015-03-20 DIAGNOSIS — I10 Essential (primary) hypertension: Secondary | ICD-10-CM | POA: Diagnosis not present

## 2015-03-20 DIAGNOSIS — Z981 Arthrodesis status: Secondary | ICD-10-CM | POA: Diagnosis not present

## 2015-03-20 DIAGNOSIS — L02612 Cutaneous abscess of left foot: Secondary | ICD-10-CM | POA: Diagnosis not present

## 2015-03-23 DIAGNOSIS — E11610 Type 2 diabetes mellitus with diabetic neuropathic arthropathy: Secondary | ICD-10-CM | POA: Diagnosis not present

## 2015-03-23 DIAGNOSIS — L02612 Cutaneous abscess of left foot: Secondary | ICD-10-CM | POA: Diagnosis not present

## 2015-03-23 DIAGNOSIS — R278 Other lack of coordination: Secondary | ICD-10-CM | POA: Diagnosis not present

## 2015-03-23 DIAGNOSIS — R262 Difficulty in walking, not elsewhere classified: Secondary | ICD-10-CM | POA: Diagnosis not present

## 2015-03-23 DIAGNOSIS — Z981 Arthrodesis status: Secondary | ICD-10-CM | POA: Diagnosis not present

## 2015-03-23 DIAGNOSIS — R2681 Unsteadiness on feet: Secondary | ICD-10-CM | POA: Diagnosis not present

## 2015-03-23 DIAGNOSIS — M25572 Pain in left ankle and joints of left foot: Secondary | ICD-10-CM | POA: Diagnosis not present

## 2015-03-23 DIAGNOSIS — M6281 Muscle weakness (generalized): Secondary | ICD-10-CM | POA: Diagnosis not present

## 2015-03-23 DIAGNOSIS — I10 Essential (primary) hypertension: Secondary | ICD-10-CM | POA: Diagnosis not present

## 2015-03-24 DIAGNOSIS — I10 Essential (primary) hypertension: Secondary | ICD-10-CM | POA: Diagnosis not present

## 2015-03-24 DIAGNOSIS — R278 Other lack of coordination: Secondary | ICD-10-CM | POA: Diagnosis not present

## 2015-03-24 DIAGNOSIS — R2681 Unsteadiness on feet: Secondary | ICD-10-CM | POA: Diagnosis not present

## 2015-03-24 DIAGNOSIS — E11610 Type 2 diabetes mellitus with diabetic neuropathic arthropathy: Secondary | ICD-10-CM | POA: Diagnosis not present

## 2015-03-25 DIAGNOSIS — R2681 Unsteadiness on feet: Secondary | ICD-10-CM | POA: Diagnosis not present

## 2015-03-25 DIAGNOSIS — E11610 Type 2 diabetes mellitus with diabetic neuropathic arthropathy: Secondary | ICD-10-CM | POA: Diagnosis not present

## 2015-03-25 DIAGNOSIS — I10 Essential (primary) hypertension: Secondary | ICD-10-CM | POA: Diagnosis not present

## 2015-03-25 DIAGNOSIS — R278 Other lack of coordination: Secondary | ICD-10-CM | POA: Diagnosis not present

## 2015-03-26 DIAGNOSIS — M25572 Pain in left ankle and joints of left foot: Secondary | ICD-10-CM | POA: Diagnosis not present

## 2015-03-26 DIAGNOSIS — E13610 Other specified diabetes mellitus with diabetic neuropathic arthropathy: Secondary | ICD-10-CM | POA: Diagnosis not present

## 2015-03-26 DIAGNOSIS — R278 Other lack of coordination: Secondary | ICD-10-CM | POA: Diagnosis not present

## 2015-03-26 DIAGNOSIS — I10 Essential (primary) hypertension: Secondary | ICD-10-CM | POA: Diagnosis not present

## 2015-03-26 DIAGNOSIS — Z4789 Encounter for other orthopedic aftercare: Secondary | ICD-10-CM | POA: Diagnosis not present

## 2015-03-26 DIAGNOSIS — R2681 Unsteadiness on feet: Secondary | ICD-10-CM | POA: Diagnosis not present

## 2015-03-26 DIAGNOSIS — M6281 Muscle weakness (generalized): Secondary | ICD-10-CM | POA: Diagnosis not present

## 2015-03-26 DIAGNOSIS — D649 Anemia, unspecified: Secondary | ICD-10-CM | POA: Diagnosis not present

## 2015-03-26 DIAGNOSIS — R262 Difficulty in walking, not elsewhere classified: Secondary | ICD-10-CM | POA: Diagnosis not present

## 2015-03-26 DIAGNOSIS — E11610 Type 2 diabetes mellitus with diabetic neuropathic arthropathy: Secondary | ICD-10-CM | POA: Diagnosis not present

## 2015-03-26 DIAGNOSIS — N183 Chronic kidney disease, stage 3 (moderate): Secondary | ICD-10-CM | POA: Diagnosis not present

## 2015-03-26 DIAGNOSIS — Z981 Arthrodesis status: Secondary | ICD-10-CM | POA: Diagnosis not present

## 2015-03-26 DIAGNOSIS — L02612 Cutaneous abscess of left foot: Secondary | ICD-10-CM | POA: Diagnosis not present

## 2015-03-26 DIAGNOSIS — E119 Type 2 diabetes mellitus without complications: Secondary | ICD-10-CM | POA: Diagnosis not present

## 2015-03-26 LAB — BASIC METABOLIC PANEL
BUN: 49 mg/dL — AB (ref 4–21)
CREATININE: 1.7 mg/dL — AB (ref 0.5–1.1)
Glucose: 100 mg/dL
POTASSIUM: 4.7 mmol/L (ref 3.4–5.3)
SODIUM: 141 mmol/L (ref 137–147)

## 2015-03-27 DIAGNOSIS — Z981 Arthrodesis status: Secondary | ICD-10-CM | POA: Diagnosis not present

## 2015-03-27 DIAGNOSIS — I10 Essential (primary) hypertension: Secondary | ICD-10-CM | POA: Diagnosis not present

## 2015-03-27 DIAGNOSIS — M6281 Muscle weakness (generalized): Secondary | ICD-10-CM | POA: Diagnosis not present

## 2015-03-27 DIAGNOSIS — R262 Difficulty in walking, not elsewhere classified: Secondary | ICD-10-CM | POA: Diagnosis not present

## 2015-03-27 DIAGNOSIS — R2681 Unsteadiness on feet: Secondary | ICD-10-CM | POA: Diagnosis not present

## 2015-03-27 DIAGNOSIS — L02612 Cutaneous abscess of left foot: Secondary | ICD-10-CM | POA: Diagnosis not present

## 2015-03-27 DIAGNOSIS — M25572 Pain in left ankle and joints of left foot: Secondary | ICD-10-CM | POA: Diagnosis not present

## 2015-03-27 DIAGNOSIS — R278 Other lack of coordination: Secondary | ICD-10-CM | POA: Diagnosis not present

## 2015-03-27 DIAGNOSIS — E11610 Type 2 diabetes mellitus with diabetic neuropathic arthropathy: Secondary | ICD-10-CM | POA: Diagnosis not present

## 2015-03-30 ENCOUNTER — Encounter: Payer: Self-pay | Admitting: Adult Health

## 2015-03-30 ENCOUNTER — Non-Acute Institutional Stay (SKILLED_NURSING_FACILITY): Payer: Medicare Other | Admitting: Adult Health

## 2015-03-30 DIAGNOSIS — N183 Chronic kidney disease, stage 3 unspecified: Secondary | ICD-10-CM

## 2015-03-30 DIAGNOSIS — K219 Gastro-esophageal reflux disease without esophagitis: Secondary | ICD-10-CM | POA: Diagnosis not present

## 2015-03-30 DIAGNOSIS — R2681 Unsteadiness on feet: Secondary | ICD-10-CM | POA: Diagnosis not present

## 2015-03-30 DIAGNOSIS — G4733 Obstructive sleep apnea (adult) (pediatric): Secondary | ICD-10-CM | POA: Diagnosis not present

## 2015-03-30 DIAGNOSIS — D62 Acute posthemorrhagic anemia: Secondary | ICD-10-CM | POA: Diagnosis not present

## 2015-03-30 DIAGNOSIS — R278 Other lack of coordination: Secondary | ICD-10-CM | POA: Diagnosis not present

## 2015-03-30 DIAGNOSIS — I10 Essential (primary) hypertension: Secondary | ICD-10-CM | POA: Diagnosis not present

## 2015-03-30 DIAGNOSIS — E11610 Type 2 diabetes mellitus with diabetic neuropathic arthropathy: Secondary | ICD-10-CM

## 2015-03-30 DIAGNOSIS — K5901 Slow transit constipation: Secondary | ICD-10-CM

## 2015-03-30 DIAGNOSIS — E785 Hyperlipidemia, unspecified: Secondary | ICD-10-CM | POA: Diagnosis not present

## 2015-03-30 DIAGNOSIS — Z4789 Encounter for other orthopedic aftercare: Secondary | ICD-10-CM | POA: Diagnosis not present

## 2015-03-30 DIAGNOSIS — R262 Difficulty in walking, not elsewhere classified: Secondary | ICD-10-CM | POA: Diagnosis not present

## 2015-03-30 DIAGNOSIS — E43 Unspecified severe protein-calorie malnutrition: Secondary | ICD-10-CM | POA: Diagnosis not present

## 2015-03-30 DIAGNOSIS — E1122 Type 2 diabetes mellitus with diabetic chronic kidney disease: Secondary | ICD-10-CM

## 2015-03-30 DIAGNOSIS — M6281 Muscle weakness (generalized): Secondary | ICD-10-CM | POA: Diagnosis not present

## 2015-03-30 DIAGNOSIS — Z981 Arthrodesis status: Secondary | ICD-10-CM | POA: Diagnosis not present

## 2015-03-30 DIAGNOSIS — M25572 Pain in left ankle and joints of left foot: Secondary | ICD-10-CM | POA: Diagnosis not present

## 2015-03-30 DIAGNOSIS — L02612 Cutaneous abscess of left foot: Secondary | ICD-10-CM | POA: Diagnosis not present

## 2015-03-30 NOTE — Progress Notes (Signed)
Patient ID: [name], female   DOB: 1955/09/03, 60 y.o.   MRN: [id number]    DATE:    03/30/15  MRN:  [id number]  BIRTHDAY: Dec 13, 1955  Facility:  Nursing Home Location:  [facility]  Nursing Home Room Number: 902-P  LEVEL OF CARE:  SNF [phone])      Contact Information    Name Relation Home Work [place] Relative [phone]  [phone]   [name]   [phone]       Code Status History    Date Active Date Inactive Code Status Order ID Comments User Context   11/06/2014  2:26 PM 11/10/2014  9:05 PM Full Code [id number]  Watt Climes Inpatient   01/30/2013  5:08 PM 02/02/2013  8:03 PM Full Code [id number]  [name], MD Inpatient       Chief Complaint  Patient presents with  . Medical Management of Chronic Issues    HISTORY OF PRESENT ILLNESS:  This is a 60 year old female who is being seen for a routine visit. She is currently having short-term rehabilitation at [facility] due to having Charcot left foot deformity for which she had surgical repair on 11/06/15.   Left foot has cast now. Latest creatinine is 1.83 . She has CKD stage 3. Latest hgb 8.3 and currently takes FeSO4 325 mg BID.   PAST MEDICAL HISTORY:  Past Medical History  Diagnosis Date  . Normocytic anemia   . Hypertension   . Foot ulcer, left (HCC)   . Leg edema   . Glaucoma   . GERD (gastroesophageal reflux disease)   . High cholesterol   . Type II diabetes mellitus (HCC)   . H/O hiatal hernia   . Arthritis     "right leg" (01/30/2013)  . CKD (chronic kidney disease), stage III     [name] 01/30/2013  . Charcot foot due to diabetes mellitus (HCC)   . Acute blood loss anemia   . Slow transit constipation   . Hyperlipidemia   . OSA on CPAP     uses CPAP every night  . MRSA (methicillin resistant Staphylococcus aureus)     left foot, currently on Bactrim  . Unsteady gait   . Charcot foot due to diabetes mellitus (HCC)   .  Acute blood loss anemia      CURRENT MEDICATIONS: Reviewed    Medication List       This list is accurate as of: 03/30/15 11:59 PM.  Always use your most recent med list.               aspirin 325 MG EC tablet  Take 325 mg by mouth daily.     atorvastatin 40 MG tablet  Commonly known as:  LIPITOR  Take 1 tablet (40 mg total) by mouth daily.     Biotin 1000 MCG tablet  Take 1,000 mcg by mouth daily.     calcium-vitamin D 500-400 MG-UNIT tablet  Commonly known as:  OSCAL-500  Take 1 tablet by mouth 2 (two) times daily.     COMBIGAN 0.2-0.5 % ophthalmic solution  Generic drug:  brimonidine-timolol  Place one drop to both eyes twice daily for glaucoma     ferrous sulfate 325 (65 FE) MG tablet  Take 325 mg by mouth 2 (two) times daily.     furosemide 40 MG tablet  Commonly known as:  LASIX  TAKE 1 TABLET BY MOUTH DAILY     latanoprost  0.005 % ophthalmic solution  Commonly known as:  XALATAN  Place 1 drop into both eyes at bedtime.     lisinopril 10 MG tablet  Commonly known as:  PRINIVIL,ZESTRIL  Take 1 tablet (10 mg total) by mouth daily.     loperamide 2 MG capsule  Commonly known as:  IMODIUM  Take by mouth. Give 2 capsules (4 mg) by mouth initially (1st dose) per standing order. DO NOT EXCEED 16 MG/ 24HOUR PRN DIARRHEA     metFORMIN 500 MG tablet  Commonly known as:  GLUCOPHAGE  Take 500 mg by mouth 2 (two) times daily with a meal.     methocarbamol 500 MG tablet  Commonly known as:  ROBAXIN  Take 500 mg by mouth every 6 (six) hours as needed for muscle spasms.     multivitamin tablet  Take 1 tablet by mouth daily.     pantoprazole 40 MG tablet  Commonly known as:  PROTONIX  Take 40 mg by mouth daily.     PROCEL Powd  Take 2 scoop by mouth 2 (two) times daily.     saccharomyces boulardii 250 MG capsule  Commonly known as:  FLORASTOR  Take 250 mg by mouth 2 (two) times daily. For 17 weeks. Stop date: 04/14/15     sennosides-docusate sodium 8.6-50  MG tablet  Commonly known as:  SENOKOT-S  Take 2 tablets by mouth at bedtime.     sitaGLIPtin 100 MG tablet  Commonly known as:  JANUVIA  Take 1 tablet (100 mg total) by mouth daily.     TYLENOL PO  Take 1,000 mg by mouth every 6 (six) hours as needed.     vitamin C 500 MG tablet  Commonly known as:  ASCORBIC ACID  Take 500 mg by mouth daily.     Zinc 50 MG Caps  Take 1 capsule by mouth daily.         Allergies  Allergen Reactions  . Codeine     REACTION: swelling in throat and itching  . Pravastatin Other (See Comments)    cramps     REVIEW OF SYSTEMS:  GENERAL: no change in appetite, no fatigue, no weight changes, no fever, chills or weakness EYES: Denies change in vision, dry eyes, eye pain, itching or discharge EARS: Denies change in hearing, ringing in ears, or earache NOSE: Denies nasal congestion or epistaxis MOUTH and THROAT: Denies oral discomfort, gingival pain or bleeding, pain from teeth or hoarseness   RESPIRATORY: no cough, SOB, DOE, wheezing, hemoptysis CARDIAC: no chest pain, or palpitations GI: no abdominal pain, diarrhea, constipation, heart burn, nausea or vomiting GU: Denies dysuria, frequency, hematuria, incontinence, or discharge PSYCHIATRIC: Denies feeling of depression or anxiety. No report of hallucinations, insomnia, paranoia, or agitation   PHYSICAL EXAMINATION  GENERAL APPEARANCE: Well nourished. In no acute distress. Normal body habitus SKIN:  Skin is warm and dry.  HEAD: Normal in size and contour. No evidence of trauma EYES: Lids open and close normally. No blepharitis, entropion or ectropion. PERRL. Conjunctivae are clear and sclerae are white. Lenses are without opacity EARS: Pinnae are normal. Patient hears normal voice tunes of the examiner MOUTH and THROAT: Lips are without lesions. Oral mucosa is moist and without lesions. Tongue is normal in shape, size, and color and without lesions NECK: supple, trachea midline, no neck  masses, no thyroid tenderness, no thyromegaly LYMPHATICS: no LAN in the neck, no supraclavicular LAN RESPIRATORY: breathing is even & unlabored, BS CTAB CARDIAC: RRR, no  murmur,no extra heart sounds, no edema GI: abdomen soft, normal BS, no masses, no tenderness, no hepatomegaly, no splenomegaly EXTREMITIES:  Able to move X 4 extremities; left foot has cast PSYCHIATRIC: Alert and oriented X 3. Affect and behavior are appropriate  LABS/RADIOLOGY: Labs reviewed: Basic Metabolic Panel:  Recent Labs  47/42/59 1154  02/19/15 1350 03/09/15 03/18/15 03/26/15  NA 138  < > 139 140 136* 141  K 5.0  < > 5.2* 5.0 4.7 4.7  CL 105  --  108  --   --   --   CO2 26  --   --   --   --   --   GLUCOSE 96  --  88  --   --   --   BUN 28*  < > 42* 42* 48* 49*  CREATININE 1.67*  < > 2.30* 1.8* 1.8* 1.7*  CALCIUM 9.4  --   --   --   --   --   < > = values in this interval not displayed. Liver Function Tests:  Recent Labs  02/06/15  AST 13  ALT 6*  ALKPHOS 65    CBC:  Recent Labs  10/29/14 1154 01/14/15 01/20/15 02/19/15 1350 03/09/15  WBC 7.5 9.6 9.5  --  6.7  HGB 10.1* 7.1* 7.8* 9.5* 8.3*  HCT 31.5*  --  24* 28.0* 26*  MCV 85.1  --   --   --   --   PLT 241 242 392  --  225   Lipid Panel:  Recent Labs  02/06/15  HDL 34*   CBG:  Recent Labs  11/10/14 1110 11/10/14 1724 02/19/15 1551  GLUCAP 103* 91 78       ASSESSMENT/PLAN:  Unsteady gait - continue rehabilitation; fall precaution  Charcot left foot deformity S/P surgical repair -  continue rehabilitation;  aspirin EC 325 mg 1 tab by mouth daily for DVT prophylaxis; Tylenol 1000 mg by mouth every 6 hours when necessary for pain; Robaxin 500 mg 1 tab by mouth every 6 hours when necessary for muscle spasm; LLE WBAT; follow-up with [name], orthopedic surgeon  Diabetes mellitus, type II - continue metformin 1000 mg 1/2 TAB = 500 mg PO BID and the new via 100 mg 1 tab by mouth daily  Hypertension - well-controlled;  continue Lasix 40 mg 1 tab by mouth daily and lisinopril 10 mg 1 tab by mouth daily  Protein calorie malnutrition, severe - albumin 2.85; start Procel 2 scoops by mouth twice a day  Anemia, acute blood loss - hemoglobin 8.3; continue ferrous sulfate 325 mg 1 tab by mouth twice a day  Chronic kidney disease is stage III  - creatinine 1.77; stable  Constipation - continue senna S2 tabs by mouth daily at bedtime  OSA - continue CPAP machine  Hyperlipidemia - continue Lipitor 40 mg 1 tab by mouth daily  GERD - stable; continue Protonix 40 mg 1 tab by mouth daily       Goals of care:  Short-term rehabilitation    [facility], NP [facility] [phone]

## 2015-03-31 DIAGNOSIS — E11610 Type 2 diabetes mellitus with diabetic neuropathic arthropathy: Secondary | ICD-10-CM | POA: Diagnosis not present

## 2015-03-31 DIAGNOSIS — I10 Essential (primary) hypertension: Secondary | ICD-10-CM | POA: Diagnosis not present

## 2015-03-31 DIAGNOSIS — R2681 Unsteadiness on feet: Secondary | ICD-10-CM | POA: Diagnosis not present

## 2015-03-31 DIAGNOSIS — R278 Other lack of coordination: Secondary | ICD-10-CM | POA: Diagnosis not present

## 2015-04-01 DIAGNOSIS — R278 Other lack of coordination: Secondary | ICD-10-CM | POA: Diagnosis not present

## 2015-04-01 DIAGNOSIS — I10 Essential (primary) hypertension: Secondary | ICD-10-CM | POA: Diagnosis not present

## 2015-04-01 DIAGNOSIS — R2681 Unsteadiness on feet: Secondary | ICD-10-CM | POA: Diagnosis not present

## 2015-04-01 DIAGNOSIS — E11610 Type 2 diabetes mellitus with diabetic neuropathic arthropathy: Secondary | ICD-10-CM | POA: Diagnosis not present

## 2015-04-02 DIAGNOSIS — I10 Essential (primary) hypertension: Secondary | ICD-10-CM | POA: Diagnosis not present

## 2015-04-02 DIAGNOSIS — R278 Other lack of coordination: Secondary | ICD-10-CM | POA: Diagnosis not present

## 2015-04-02 DIAGNOSIS — E11610 Type 2 diabetes mellitus with diabetic neuropathic arthropathy: Secondary | ICD-10-CM | POA: Diagnosis not present

## 2015-04-02 DIAGNOSIS — R2681 Unsteadiness on feet: Secondary | ICD-10-CM | POA: Diagnosis not present

## 2015-04-03 DIAGNOSIS — R278 Other lack of coordination: Secondary | ICD-10-CM | POA: Diagnosis not present

## 2015-04-03 DIAGNOSIS — E11610 Type 2 diabetes mellitus with diabetic neuropathic arthropathy: Secondary | ICD-10-CM | POA: Diagnosis not present

## 2015-04-03 DIAGNOSIS — R2681 Unsteadiness on feet: Secondary | ICD-10-CM | POA: Diagnosis not present

## 2015-04-03 DIAGNOSIS — I10 Essential (primary) hypertension: Secondary | ICD-10-CM | POA: Diagnosis not present

## 2015-04-06 DIAGNOSIS — R278 Other lack of coordination: Secondary | ICD-10-CM | POA: Diagnosis not present

## 2015-04-06 DIAGNOSIS — Z981 Arthrodesis status: Secondary | ICD-10-CM | POA: Diagnosis not present

## 2015-04-06 DIAGNOSIS — L02612 Cutaneous abscess of left foot: Secondary | ICD-10-CM | POA: Diagnosis not present

## 2015-04-06 DIAGNOSIS — R2681 Unsteadiness on feet: Secondary | ICD-10-CM | POA: Diagnosis not present

## 2015-04-06 DIAGNOSIS — M6281 Muscle weakness (generalized): Secondary | ICD-10-CM | POA: Diagnosis not present

## 2015-04-06 DIAGNOSIS — M25572 Pain in left ankle and joints of left foot: Secondary | ICD-10-CM | POA: Diagnosis not present

## 2015-04-06 DIAGNOSIS — E11610 Type 2 diabetes mellitus with diabetic neuropathic arthropathy: Secondary | ICD-10-CM | POA: Diagnosis not present

## 2015-04-06 DIAGNOSIS — R262 Difficulty in walking, not elsewhere classified: Secondary | ICD-10-CM | POA: Diagnosis not present

## 2015-04-06 DIAGNOSIS — I10 Essential (primary) hypertension: Secondary | ICD-10-CM | POA: Diagnosis not present

## 2015-04-07 DIAGNOSIS — R278 Other lack of coordination: Secondary | ICD-10-CM | POA: Diagnosis not present

## 2015-04-07 DIAGNOSIS — R2681 Unsteadiness on feet: Secondary | ICD-10-CM | POA: Diagnosis not present

## 2015-04-07 DIAGNOSIS — E11610 Type 2 diabetes mellitus with diabetic neuropathic arthropathy: Secondary | ICD-10-CM | POA: Diagnosis not present

## 2015-04-07 DIAGNOSIS — I10 Essential (primary) hypertension: Secondary | ICD-10-CM | POA: Diagnosis not present

## 2015-04-08 DIAGNOSIS — R2681 Unsteadiness on feet: Secondary | ICD-10-CM | POA: Diagnosis not present

## 2015-04-08 DIAGNOSIS — I10 Essential (primary) hypertension: Secondary | ICD-10-CM | POA: Diagnosis not present

## 2015-04-08 DIAGNOSIS — R278 Other lack of coordination: Secondary | ICD-10-CM | POA: Diagnosis not present

## 2015-04-08 DIAGNOSIS — E11610 Type 2 diabetes mellitus with diabetic neuropathic arthropathy: Secondary | ICD-10-CM | POA: Diagnosis not present

## 2015-04-09 DIAGNOSIS — E11610 Type 2 diabetes mellitus with diabetic neuropathic arthropathy: Secondary | ICD-10-CM | POA: Diagnosis not present

## 2015-04-09 DIAGNOSIS — I10 Essential (primary) hypertension: Secondary | ICD-10-CM | POA: Diagnosis not present

## 2015-04-09 DIAGNOSIS — R278 Other lack of coordination: Secondary | ICD-10-CM | POA: Diagnosis not present

## 2015-04-09 DIAGNOSIS — R2681 Unsteadiness on feet: Secondary | ICD-10-CM | POA: Diagnosis not present

## 2015-04-10 DIAGNOSIS — I10 Essential (primary) hypertension: Secondary | ICD-10-CM | POA: Diagnosis not present

## 2015-04-10 DIAGNOSIS — R278 Other lack of coordination: Secondary | ICD-10-CM | POA: Diagnosis not present

## 2015-04-10 DIAGNOSIS — L02612 Cutaneous abscess of left foot: Secondary | ICD-10-CM | POA: Diagnosis not present

## 2015-04-10 DIAGNOSIS — Z4789 Encounter for other orthopedic aftercare: Secondary | ICD-10-CM | POA: Diagnosis not present

## 2015-04-10 DIAGNOSIS — E11610 Type 2 diabetes mellitus with diabetic neuropathic arthropathy: Secondary | ICD-10-CM | POA: Diagnosis not present

## 2015-04-10 DIAGNOSIS — R2681 Unsteadiness on feet: Secondary | ICD-10-CM | POA: Diagnosis not present

## 2015-04-13 DIAGNOSIS — E11610 Type 2 diabetes mellitus with diabetic neuropathic arthropathy: Secondary | ICD-10-CM | POA: Diagnosis not present

## 2015-04-13 DIAGNOSIS — R2681 Unsteadiness on feet: Secondary | ICD-10-CM | POA: Diagnosis not present

## 2015-04-13 DIAGNOSIS — I10 Essential (primary) hypertension: Secondary | ICD-10-CM | POA: Diagnosis not present

## 2015-04-13 DIAGNOSIS — R278 Other lack of coordination: Secondary | ICD-10-CM | POA: Diagnosis not present

## 2015-04-14 DIAGNOSIS — I10 Essential (primary) hypertension: Secondary | ICD-10-CM | POA: Diagnosis not present

## 2015-04-14 DIAGNOSIS — E11610 Type 2 diabetes mellitus with diabetic neuropathic arthropathy: Secondary | ICD-10-CM | POA: Diagnosis not present

## 2015-04-14 DIAGNOSIS — R2681 Unsteadiness on feet: Secondary | ICD-10-CM | POA: Diagnosis not present

## 2015-04-14 DIAGNOSIS — R278 Other lack of coordination: Secondary | ICD-10-CM | POA: Diagnosis not present

## 2015-04-15 ENCOUNTER — Encounter: Payer: Self-pay | Admitting: Adult Health

## 2015-04-15 ENCOUNTER — Non-Acute Institutional Stay (SKILLED_NURSING_FACILITY): Payer: Medicare Other | Admitting: Adult Health

## 2015-04-15 DIAGNOSIS — G4733 Obstructive sleep apnea (adult) (pediatric): Secondary | ICD-10-CM

## 2015-04-15 DIAGNOSIS — I10 Essential (primary) hypertension: Secondary | ICD-10-CM | POA: Diagnosis not present

## 2015-04-15 DIAGNOSIS — N183 Chronic kidney disease, stage 3 unspecified: Secondary | ICD-10-CM

## 2015-04-15 DIAGNOSIS — E1122 Type 2 diabetes mellitus with diabetic chronic kidney disease: Secondary | ICD-10-CM

## 2015-04-15 DIAGNOSIS — E43 Unspecified severe protein-calorie malnutrition: Secondary | ICD-10-CM | POA: Diagnosis not present

## 2015-04-15 DIAGNOSIS — E785 Hyperlipidemia, unspecified: Secondary | ICD-10-CM | POA: Diagnosis not present

## 2015-04-15 DIAGNOSIS — D62 Acute posthemorrhagic anemia: Secondary | ICD-10-CM | POA: Diagnosis not present

## 2015-04-15 DIAGNOSIS — K5901 Slow transit constipation: Secondary | ICD-10-CM

## 2015-04-15 DIAGNOSIS — L089 Local infection of the skin and subcutaneous tissue, unspecified: Secondary | ICD-10-CM

## 2015-04-15 DIAGNOSIS — R2681 Unsteadiness on feet: Secondary | ICD-10-CM

## 2015-04-15 DIAGNOSIS — T148 Other injury of unspecified body region: Secondary | ICD-10-CM | POA: Diagnosis not present

## 2015-04-15 DIAGNOSIS — E11610 Type 2 diabetes mellitus with diabetic neuropathic arthropathy: Secondary | ICD-10-CM | POA: Diagnosis not present

## 2015-04-15 DIAGNOSIS — K219 Gastro-esophageal reflux disease without esophagitis: Secondary | ICD-10-CM | POA: Diagnosis not present

## 2015-04-15 DIAGNOSIS — T148XXA Other injury of unspecified body region, initial encounter: Secondary | ICD-10-CM

## 2015-04-15 NOTE — Progress Notes (Signed)
Patient ID: [name], female   DOB: 06-Oct-1955, 60 y.o.   MRN: [id number]    DATE:    04/15/15  MRN:  [id number]  BIRTHDAY: 27-Aug-1955  Facility:  Nursing Home Location:  [facility]  Nursing Home Room Number: 902-P  LEVEL OF CARE:  SNF [phone])      Contact Information    Name Relation Home Work [place] Relative [phone]  [phone]   [name]   [phone]       Code Status History    Date Active Date Inactive Code Status Order ID Comments User Context   11/06/2014  2:26 PM 11/10/2014  9:05 PM Full Code [id number]  Watt Climes Inpatient   01/30/2013  5:08 PM 02/02/2013  8:03 PM Full Code [id number]  [name], MD Inpatient       Chief Complaint  Patient presents with  . Discharge Note    HISTORY OF PRESENT ILLNESS:  This is a 60 year old female who is for discharge home with Home health PT, OT and Nursing for wound care. DME:  Over-the-tub bench, extra wide rolling walker, large hips. She has been admitted to [facility] on 11/10/14 from [facility]. She has charcot left foot deformity due to diabetes mellitus for which she had elective left talonavicular and calcaneocuboid arthrodesis with corrective osteotomy, heel cord lengthening and application of thin wire circular frame on 11/06/14. The external fixators were removed then her left foot was casted. The cast has now been removed and she is now wearing a camboot.   Patient was admitted to this facility for short-term rehabilitation after the patient's recent hospitalization.  Patient has completed SNF rehabilitation and therapy has cleared the patient for discharge.  PAST MEDICAL HISTORY:  Past Medical History  Diagnosis Date  . Normocytic anemia   . Hypertension   . Foot ulcer, left (HCC)   . Leg edema   . Glaucoma   . GERD (gastroesophageal reflux disease)   . High cholesterol   . Type II diabetes mellitus (HCC)   . H/O hiatal  hernia   . Arthritis     "right leg" (01/30/2013)  . CKD (chronic kidney disease), stage III     [name] 01/30/2013  . Charcot foot due to diabetes mellitus (HCC)   . Acute blood loss anemia   . Slow transit constipation   . Hyperlipidemia   . OSA on CPAP     uses CPAP every night  . MRSA (methicillin resistant Staphylococcus aureus)     left foot, currently on Bactrim  . Unsteady gait   . Charcot foot due to diabetes mellitus (HCC)   . Acute blood loss anemia      CURRENT MEDICATIONS: Reviewed    Medication List       This list is accurate as of: 04/15/15 10:08 PM.  Always use your most recent med list.               aspirin 325 MG EC tablet  Take 325 mg by mouth daily.     atorvastatin 40 MG tablet  Commonly known as:  LIPITOR  Take 1 tablet (40 mg total) by mouth daily.     Biotin 1000 MCG tablet  Take 1,000 mcg by mouth daily.     calcium-vitamin D 500-400 MG-UNIT tablet  Commonly known as:  OSCAL-500  Take 1 tablet by mouth 2 (two) times daily.     COMBIGAN 0.2-0.5 % ophthalmic  solution  Generic drug:  brimonidine-timolol  Place one drop to both eyes twice daily for glaucoma     ferrous sulfate 325 (65 FE) MG tablet  Take 325 mg by mouth 2 (two) times daily.     furosemide 40 MG tablet  Commonly known as:  LASIX  TAKE 1 TABLET BY MOUTH DAILY     latanoprost 0.005 % ophthalmic solution  Commonly known as:  XALATAN  Place 1 drop into both eyes at bedtime.     lisinopril 10 MG tablet  Commonly known as:  PRINIVIL,ZESTRIL  Take 1 tablet (10 mg total) by mouth daily.     loperamide 2 MG capsule  Commonly known as:  IMODIUM  Take by mouth. Give 2 capsules (4 mg) by mouth initially (1st dose) per standing order. DO NOT EXCEED 16 MG/ 24HOUR PRN DIARRHEA     metFORMIN 500 MG tablet  Commonly known as:  GLUCOPHAGE  Take 500 mg by mouth 2 (two) times daily with a meal.     methocarbamol 500 MG tablet  Commonly known as:  ROBAXIN  Take 500 mg by mouth  every 6 (six) hours as needed for muscle spasms.     multivitamin tablet  Take 1 tablet by mouth daily.     pantoprazole 40 MG tablet  Commonly known as:  PROTONIX  Take 40 mg by mouth daily.     PROCEL Powd  Take 2 scoop by mouth 2 (two) times daily.     sennosides-docusate sodium 8.6-50 MG tablet  Commonly known as:  SENOKOT-S  Take 2 tablets by mouth at bedtime.     sitaGLIPtin 100 MG tablet  Commonly known as:  JANUVIA  Take 1 tablet (100 mg total) by mouth daily.     TYLENOL PO  Take 1,000 mg by mouth every 6 (six) hours as needed.     vitamin C 500 MG tablet  Commonly known as:  ASCORBIC ACID  Take 500 mg by mouth daily.     Zinc 50 MG Caps  Take 1 capsule by mouth daily.         Allergies  Allergen Reactions  . Codeine     REACTION: swelling in throat and itching  . Pravastatin Other (See Comments)    cramps     REVIEW OF SYSTEMS:  GENERAL: no change in appetite, no fatigue, no weight changes, no fever, chills or weakness EYES: Denies change in vision, dry eyes, eye pain, itching or discharge EARS: Denies change in hearing, ringing in ears, or earache NOSE: Denies nasal congestion or epistaxis MOUTH and THROAT: Denies oral discomfort, gingival pain or bleeding, pain from teeth or hoarseness   RESPIRATORY: no cough, SOB, DOE, wheezing, hemoptysis CARDIAC: no chest pain, or palpitations GI: no abdominal pain, diarrhea, constipation, heart burn, nausea or vomiting GU: Denies dysuria, frequency, hematuria, incontinence, or discharge PSYCHIATRIC: Denies feeling of depression or anxiety. No report of hallucinations, insomnia, paranoia, or agitation   PHYSICAL EXAMINATION  GENERAL APPEARANCE: Well nourished. In no acute distress. Normal body habitus SKIN:  Skin is warm and dry.Left foot lateral area near the big toe noted to have pus/drainage   HEAD: Normal in size and contour. No evidence of trauma EYES: Lids open and close normally. No blepharitis,  entropion or ectropion. PERRL. Conjunctivae are clear and sclerae are white. Lenses are without opacity EARS: Pinnae are normal. Patient hears normal voice tunes of the examiner MOUTH and THROAT: Lips are without lesions. Oral mucosa is moist and  without lesions. Tongue is normal in shape, size, and color and without lesions NECK: supple, trachea midline, no neck masses, no thyroid tenderness, no thyromegaly LYMPHATICS: no LAN in the neck, no supraclavicular LAN RESPIRATORY: breathing is even & unlabored, BS CTAB CARDIAC: RRR, no murmur,no extra heart sounds, no edema GI: abdomen soft, normal BS, no masses, no tenderness, no hepatomegaly, no splenomegaly EXTREMITIES:  Able to move X 4 extremities; left foot has has 2 open wounds with dry dressing and camboot PSYCHIATRIC: Alert and oriented X 3. Affect and behavior are appropriate  LABS/RADIOLOGY: Labs reviewed: Basic Metabolic Panel:  Recent Labs  47/42/59 1154  02/19/15 1350 03/09/15 03/18/15 03/26/15  NA 138  < > 139 140 136* 141  K 5.0  < > 5.2* 5.0 4.7 4.7  CL 105  --  108  --   --   --   CO2 26  --   --   --   --   --   GLUCOSE 96  --  88  --   --   --   BUN 28*  < > 42* 42* 48* 49*  CREATININE 1.67*  < > 2.30* 1.8* 1.8* 1.7*  CALCIUM 9.4  --   --   --   --   --   < > = values in this interval not displayed. Liver Function Tests:  Recent Labs  02/06/15  AST 13  ALT 6*  ALKPHOS 65    CBC:  Recent Labs  10/29/14 1154 01/14/15 01/20/15 02/19/15 1350 03/09/15  WBC 7.5 9.6 9.5  --  6.7  HGB 10.1* 7.1* 7.8* 9.5* 8.3*  HCT 31.5*  --  24* 28.0* 26*  MCV 85.1  --   --   --   --   PLT 241 242 392  --  225   Lipid Panel:  Recent Labs  02/06/15  HDL 34*   CBG:  Recent Labs  11/10/14 1110 11/10/14 1724 02/19/15 1551  GLUCAP 103* 91 78       ASSESSMENT/PLAN:  Unsteady gait - for Home healthPT, OT and Nursing  Charcot left foot deformity S/P surgical repair -   aspirin EC 325 mg 1 tab by mouth daily for  DVT prophylaxis; Tylenol 1000 mg by mouth every 6 hours when necessary for pain; Robaxin 500 mg 1 tab by mouth every 6 hours when necessary for muscle spasm; LLE WBAT; follow-up with [name], orthopedic surgeon; notified Dr [name] regarding pus drainage and was told to keep April 7 appointment  Diabetes mellitus, type II - continue metformin 1000 mg 1/2 TAB = 500 mg PO BID and Januvia 100 mg 1 tab by mouth daily  Hypertension - well-controlled; continue Lasix 40 mg 1 tab by mouth daily and lisinopril 10 mg 1 tab by mouth daily  Protein calorie malnutrition, severe - continue Procel 2 scoops by mouth twice a day  Anemia, acute blood loss - hemoglobin 8.3; continue ferrous sulfate 325 mg 1 tab by mouth twice a day  Chronic kidney disease is stage III  - creatinine 1.77; stable; check BMP  Constipation - continue senna S2 tabs by mouth daily at bedtime  OSA - continue CPAP machine  Hyperlipidemia - continue Lipitor 40 mg 1 tab by mouth daily  GERD - stable; continue Protonix 40 mg 1 tab by mouth daily  Left foot wound infection - start Doxycycline 100 mg 1 capsule BID X 14 days and Florastor 250 mg 1 capsule BIB X 17 days; Dr.  [name] was notified and was told to keep April 7 appointment.; check CBC      I have filled out patient's discharge paperwork and written prescriptions.  Patient will receive home health PT, OT and Nusing  DME provided:  Over-the-tub bench, extra wide rolling walker, large hips  Total discharge time: Greater than 30 minutes  Discharge time involved coordination of the discharge process with social worker, nursing staff and therapy department. Medical justification for home health services/DME verified.      [facility], NP [employer] [phone]

## 2015-04-16 DIAGNOSIS — R278 Other lack of coordination: Secondary | ICD-10-CM | POA: Diagnosis not present

## 2015-04-16 DIAGNOSIS — E11610 Type 2 diabetes mellitus with diabetic neuropathic arthropathy: Secondary | ICD-10-CM | POA: Diagnosis not present

## 2015-04-16 DIAGNOSIS — D649 Anemia, unspecified: Secondary | ICD-10-CM | POA: Diagnosis not present

## 2015-04-16 DIAGNOSIS — R2681 Unsteadiness on feet: Secondary | ICD-10-CM | POA: Diagnosis not present

## 2015-04-16 DIAGNOSIS — I10 Essential (primary) hypertension: Secondary | ICD-10-CM | POA: Diagnosis not present

## 2015-04-21 DIAGNOSIS — E1122 Type 2 diabetes mellitus with diabetic chronic kidney disease: Secondary | ICD-10-CM | POA: Diagnosis not present

## 2015-04-21 DIAGNOSIS — L97519 Non-pressure chronic ulcer of other part of right foot with unspecified severity: Secondary | ICD-10-CM | POA: Diagnosis not present

## 2015-04-21 DIAGNOSIS — E11610 Type 2 diabetes mellitus with diabetic neuropathic arthropathy: Secondary | ICD-10-CM | POA: Diagnosis not present

## 2015-04-21 DIAGNOSIS — I10 Essential (primary) hypertension: Secondary | ICD-10-CM | POA: Diagnosis not present

## 2015-04-21 DIAGNOSIS — E11621 Type 2 diabetes mellitus with foot ulcer: Secondary | ICD-10-CM | POA: Diagnosis not present

## 2015-04-21 DIAGNOSIS — N183 Chronic kidney disease, stage 3 (moderate): Secondary | ICD-10-CM | POA: Diagnosis not present

## 2015-04-23 DIAGNOSIS — N183 Chronic kidney disease, stage 3 (moderate): Secondary | ICD-10-CM | POA: Diagnosis not present

## 2015-04-23 DIAGNOSIS — E11621 Type 2 diabetes mellitus with foot ulcer: Secondary | ICD-10-CM | POA: Diagnosis not present

## 2015-04-23 DIAGNOSIS — I10 Essential (primary) hypertension: Secondary | ICD-10-CM | POA: Diagnosis not present

## 2015-04-23 DIAGNOSIS — E1122 Type 2 diabetes mellitus with diabetic chronic kidney disease: Secondary | ICD-10-CM | POA: Diagnosis not present

## 2015-04-23 DIAGNOSIS — L97519 Non-pressure chronic ulcer of other part of right foot with unspecified severity: Secondary | ICD-10-CM | POA: Diagnosis not present

## 2015-04-23 DIAGNOSIS — E11610 Type 2 diabetes mellitus with diabetic neuropathic arthropathy: Secondary | ICD-10-CM | POA: Diagnosis not present

## 2015-04-24 DIAGNOSIS — E11610 Type 2 diabetes mellitus with diabetic neuropathic arthropathy: Secondary | ICD-10-CM | POA: Diagnosis not present

## 2015-04-24 DIAGNOSIS — Z4789 Encounter for other orthopedic aftercare: Secondary | ICD-10-CM | POA: Diagnosis not present

## 2015-04-24 DIAGNOSIS — L02612 Cutaneous abscess of left foot: Secondary | ICD-10-CM | POA: Diagnosis not present

## 2015-04-27 DIAGNOSIS — N183 Chronic kidney disease, stage 3 (moderate): Secondary | ICD-10-CM | POA: Diagnosis not present

## 2015-04-27 DIAGNOSIS — I10 Essential (primary) hypertension: Secondary | ICD-10-CM | POA: Diagnosis not present

## 2015-04-27 DIAGNOSIS — E11610 Type 2 diabetes mellitus with diabetic neuropathic arthropathy: Secondary | ICD-10-CM | POA: Diagnosis not present

## 2015-04-27 DIAGNOSIS — E11621 Type 2 diabetes mellitus with foot ulcer: Secondary | ICD-10-CM | POA: Diagnosis not present

## 2015-04-27 DIAGNOSIS — L97519 Non-pressure chronic ulcer of other part of right foot with unspecified severity: Secondary | ICD-10-CM | POA: Diagnosis not present

## 2015-04-27 DIAGNOSIS — E1122 Type 2 diabetes mellitus with diabetic chronic kidney disease: Secondary | ICD-10-CM | POA: Diagnosis not present

## 2015-04-29 DIAGNOSIS — E11610 Type 2 diabetes mellitus with diabetic neuropathic arthropathy: Secondary | ICD-10-CM | POA: Diagnosis not present

## 2015-04-29 DIAGNOSIS — I10 Essential (primary) hypertension: Secondary | ICD-10-CM | POA: Diagnosis not present

## 2015-04-29 DIAGNOSIS — E1122 Type 2 diabetes mellitus with diabetic chronic kidney disease: Secondary | ICD-10-CM | POA: Diagnosis not present

## 2015-04-29 DIAGNOSIS — N183 Chronic kidney disease, stage 3 (moderate): Secondary | ICD-10-CM | POA: Diagnosis not present

## 2015-04-29 DIAGNOSIS — E11621 Type 2 diabetes mellitus with foot ulcer: Secondary | ICD-10-CM | POA: Diagnosis not present

## 2015-04-29 DIAGNOSIS — L97519 Non-pressure chronic ulcer of other part of right foot with unspecified severity: Secondary | ICD-10-CM | POA: Diagnosis not present

## 2015-04-30 DIAGNOSIS — H401131 Primary open-angle glaucoma, bilateral, mild stage: Secondary | ICD-10-CM | POA: Diagnosis not present

## 2015-04-30 DIAGNOSIS — E1122 Type 2 diabetes mellitus with diabetic chronic kidney disease: Secondary | ICD-10-CM | POA: Diagnosis not present

## 2015-04-30 DIAGNOSIS — E11610 Type 2 diabetes mellitus with diabetic neuropathic arthropathy: Secondary | ICD-10-CM | POA: Diagnosis not present

## 2015-04-30 DIAGNOSIS — E11621 Type 2 diabetes mellitus with foot ulcer: Secondary | ICD-10-CM | POA: Diagnosis not present

## 2015-04-30 DIAGNOSIS — I10 Essential (primary) hypertension: Secondary | ICD-10-CM | POA: Diagnosis not present

## 2015-04-30 DIAGNOSIS — L97519 Non-pressure chronic ulcer of other part of right foot with unspecified severity: Secondary | ICD-10-CM | POA: Diagnosis not present

## 2015-04-30 DIAGNOSIS — N183 Chronic kidney disease, stage 3 (moderate): Secondary | ICD-10-CM | POA: Diagnosis not present

## 2015-04-30 LAB — HM DIABETES EYE EXAM

## 2015-05-01 DIAGNOSIS — E11610 Type 2 diabetes mellitus with diabetic neuropathic arthropathy: Secondary | ICD-10-CM | POA: Diagnosis not present

## 2015-05-01 DIAGNOSIS — I10 Essential (primary) hypertension: Secondary | ICD-10-CM | POA: Diagnosis not present

## 2015-05-01 DIAGNOSIS — E1122 Type 2 diabetes mellitus with diabetic chronic kidney disease: Secondary | ICD-10-CM | POA: Diagnosis not present

## 2015-05-01 DIAGNOSIS — N183 Chronic kidney disease, stage 3 (moderate): Secondary | ICD-10-CM | POA: Diagnosis not present

## 2015-05-01 DIAGNOSIS — L97519 Non-pressure chronic ulcer of other part of right foot with unspecified severity: Secondary | ICD-10-CM | POA: Diagnosis not present

## 2015-05-01 DIAGNOSIS — E11621 Type 2 diabetes mellitus with foot ulcer: Secondary | ICD-10-CM | POA: Diagnosis not present

## 2015-05-04 ENCOUNTER — Ambulatory Visit (INDEPENDENT_AMBULATORY_CARE_PROVIDER_SITE_OTHER): Payer: Medicare Other | Admitting: Pulmonary Disease

## 2015-05-04 ENCOUNTER — Encounter: Payer: Self-pay | Admitting: Pulmonary Disease

## 2015-05-04 VITALS — BP 105/53 | Temp 98.4°F | Ht 69.0 in | Wt 271.7 lb

## 2015-05-04 DIAGNOSIS — E11621 Type 2 diabetes mellitus with foot ulcer: Secondary | ICD-10-CM | POA: Diagnosis not present

## 2015-05-04 DIAGNOSIS — Z9889 Other specified postprocedural states: Secondary | ICD-10-CM | POA: Diagnosis not present

## 2015-05-04 DIAGNOSIS — N183 Chronic kidney disease, stage 3 (moderate): Secondary | ICD-10-CM | POA: Diagnosis not present

## 2015-05-04 DIAGNOSIS — I10 Essential (primary) hypertension: Secondary | ICD-10-CM | POA: Diagnosis not present

## 2015-05-04 DIAGNOSIS — E11610 Type 2 diabetes mellitus with diabetic neuropathic arthropathy: Secondary | ICD-10-CM | POA: Diagnosis not present

## 2015-05-04 DIAGNOSIS — E1169 Type 2 diabetes mellitus with other specified complication: Secondary | ICD-10-CM

## 2015-05-04 DIAGNOSIS — Z7984 Long term (current) use of oral hypoglycemic drugs: Secondary | ICD-10-CM | POA: Diagnosis not present

## 2015-05-04 DIAGNOSIS — E1122 Type 2 diabetes mellitus with diabetic chronic kidney disease: Secondary | ICD-10-CM | POA: Diagnosis not present

## 2015-05-04 DIAGNOSIS — L97519 Non-pressure chronic ulcer of other part of right foot with unspecified severity: Secondary | ICD-10-CM | POA: Diagnosis not present

## 2015-05-04 LAB — POCT GLYCOSYLATED HEMOGLOBIN (HGB A1C): Hemoglobin A1C: 6.3

## 2015-05-04 LAB — GLUCOSE, CAPILLARY: Glucose-Capillary: 162 mg/dL — ABNORMAL HIGH (ref 65–99)

## 2015-05-04 MED ORDER — BRIMONIDINE TARTRATE-TIMOLOL 0.2-0.5 % OP SOLN: 1.0000 [drp] | Freq: Two times a day (BID) | OPHTHALMIC | Status: DC

## 2015-05-04 MED ORDER — METFORMIN HCL 500 MG PO TABS: 500.0000 mg | ORAL_TABLET | Freq: Two times a day (BID) | ORAL | Status: DC

## 2015-05-04 MED ORDER — ATORVASTATIN CALCIUM 40 MG PO TABS: 40.0000 mg | ORAL_TABLET | Freq: Every day | ORAL | Status: DC

## 2015-05-04 MED ORDER — FERROUS SULFATE 325 (65 FE) MG PO TABS: 325.0000 mg | ORAL_TABLET | Freq: Every day | ORAL | Status: DC

## 2015-05-04 MED ORDER — LISINOPRIL 5 MG PO TABS: 5.0000 mg | ORAL_TABLET | Freq: Every day | ORAL | Status: DC

## 2015-05-04 MED ORDER — PANTOPRAZOLE SODIUM 20 MG PO TBEC: 20.0000 mg | DELAYED_RELEASE_TABLET | Freq: Every day | ORAL | Status: DC

## 2015-05-04 MED ORDER — BIMATOPROST 0.03 % OP SOLN: 1.0000 [drp] | Freq: Every day | OPHTHALMIC | Status: DC

## 2015-05-04 MED ORDER — SITAGLIPTIN PHOSPHATE 50 MG PO TABS: 50.0000 mg | ORAL_TABLET | Freq: Every day | ORAL | Status: DC

## 2015-05-04 NOTE — Assessment & Plan Note (Signed)
BP Readings from Last 3 Encounters:  05/04/15 105/53  04/15/15 115/60  03/30/15 124/68    Lab Results  Component Value Date   NA 141 03/26/2015   K 4.7 03/26/2015   CREATININE 1.7* 03/26/2015    Assessment: Blood pressure control: Controlled Progress toward BP goal:  At goal  Plan: Discontinue Lasix 40 mg daily Decrease lisinopril from 10 mg daily to 5 mg daily Follow-up in 3 months

## 2015-05-04 NOTE — Progress Notes (Signed)
Eye exam completed at [facility] on 04-30-2015. Requested records via [place].

## 2015-05-04 NOTE — Assessment & Plan Note (Signed)
Underwent elective left talonavicular and calcaneocuboid arthrodesis with corrective osteotomy, heel cord lengthening and application of thin wire circular frame due to charcot foot deformity by Dr. Toni Arthurs (Orthopedic Surgery) on 11/06/2014. Undergoing continued follow-up with [name].  Referral placed for [name].

## 2015-05-04 NOTE — Assessment & Plan Note (Signed)
Lab Results  Component Value Date   HGBA1C 6.3 05/04/2015   HGBA1C 7.0 02/06/2015   HGBA1C 7.2* 10/29/2014     Assessment: Diabetes control: good control (HgbA1C at goal) Progress toward A1C goal:  at goal  Denies episodes of hypoglycemia.  Plan: Continue metformin 500 mg twice a day Decrease Januvia from 100 mg daily to 50 mg daily She has already had her eye exam

## 2015-05-04 NOTE — Progress Notes (Signed)
Case discussed with Dr. Krall soon after the resident saw the patient.  We reviewed the resident's history and exam and pertinent patient test results.  I agree with the assessment, diagnosis, and plan of care documented in the resident's note. 

## 2015-05-04 NOTE — Progress Notes (Signed)
Subjective:    Patient ID: [name], female    DOB: December 08, 1955, 60 y.o.   MRN: [id number]  HPI [name] is a 60 year old woman with history of hypertension, OSA on CPAP, GERD, diabetes type 2, CKD stage III presenting for follow-up of her hypertension.  She underwent elective left talonavicular and calcaneocuboid arthrodesis with corrective osteotomy, heel cord lengthening and application of thin wire circular frame due to charcot foot deformity by Dr. Toni Arthurs (Orthopedic Surgery) on 11/06/2014. She was discharged to [facility] for rehabilitation. She has returned home from her SNF as of March 31st. She reports she is doing well.    Review of Systems Constitutional: no fevers/chills Respiratory: no shortness of breath Cardiovascular: no chest pain Gastrointestinal: no nausea/vomiting, no abdominal pain, no constipation, no diarrhea   Past Medical History  Diagnosis Date  . Glaucoma   . GERD (gastroesophageal reflux disease)   . H/O hiatal hernia   . OSA on CPAP     uses CPAP every night  . MRSA (methicillin resistant Staphylococcus aureus)   . Normocytic anemia   . Arthritis     "right leg" (01/30/2013)  . Type II diabetes mellitus (HCC)   . Hyperlipidemia   . Hypertension   . CKD (chronic kidney disease), stage III     Current Outpatient Prescriptions on File Prior to Visit  Medication Sig Dispense Refill  . Acetaminophen (TYLENOL PO) Take 1,000 mg by mouth every 6 (six) hours as needed.    Marland Kitchen aspirin 325 MG EC tablet Take 325 mg by mouth daily.    Marland Kitchen atorvastatin (LIPITOR) 40 MG tablet Take 1 tablet (40 mg total) by mouth daily. 30 tablet 0  . Biotin 1000 MCG tablet Take 1,000 mcg by mouth daily.    . calcium-vitamin D (OSCAL-500) 500-400 MG-UNIT tablet Take 1 tablet by mouth 2 (two) times daily.     . COMBIGAN 0.2-0.5 % ophthalmic solution Place one drop to both eyes twice daily for glaucoma    . ferrous sulfate 325 (65 FE) MG tablet Take  325 mg by mouth 2 (two) times daily.    . furosemide (LASIX) 40 MG tablet TAKE 1 TABLET BY MOUTH DAILY 30 tablet 1  . latanoprost (XALATAN) 0.005 % ophthalmic solution Place 1 drop into both eyes at bedtime.    Marland Kitchen lisinopril (PRINIVIL,ZESTRIL) 10 MG tablet Take 1 tablet (10 mg total) by mouth daily. 30 tablet 0  . loperamide (IMODIUM) 2 MG capsule Take by mouth. Give 2 capsules (4 mg) by mouth initially (1st dose) per standing order. DO NOT EXCEED 16 MG/ 24HOUR PRN DIARRHEA    . metFORMIN (GLUCOPHAGE) 500 MG tablet Take 500 mg by mouth 2 (two) times daily with a meal.    . methocarbamol (ROBAXIN) 500 MG tablet Take 500 mg by mouth every 6 (six) hours as needed for muscle spasms.    . Multiple Vitamin (MULTIVITAMIN) tablet Take 1 tablet by mouth daily.     . pantoprazole (PROTONIX) 40 MG tablet Take 40 mg by mouth daily.    . Protein (PROCEL) POWD Take 2 scoop by mouth 2 (two) times daily.    . sennosides-docusate sodium (SENOKOT-S) 8.6-50 MG tablet Take 2 tablets by mouth at bedtime.     . sitaGLIPtin (JANUVIA) 100 MG tablet Take 1 tablet (100 mg total) by mouth daily. 90 tablet 3  . vitamin C (ASCORBIC ACID) 500 MG tablet Take 500 mg by mouth daily.    Marland Kitchen  Zinc 50 MG CAPS Take 1 capsule by mouth daily.    . [DISCONTINUED] pravastatin (PRAVACHOL) 20 MG tablet Take 1 tablet (20 mg total) by mouth daily. 30 tablet 6   No current facility-administered medications on file prior to visit.   Objective:   Physical Exam  Blood pressure 105/53, temperature 98.4 F (36.9 C), temperature source Oral, height 5\' 9"  (1.753 m), weight 271 lb 11.2 oz (123.242 kg), SpO2 100 %. General Apperance: NAD HEENT: Normocephalic, atraumatic, anicteric sclera Neck: Supple, trachea midline Lungs: Clear to auscultation bilaterally. No wheezes, rhonchi or rales. Breathing comfortably Heart: Regular rate and rhythm, no murmur/rub/gallop Abdomen: Soft, nontender, nondistended, no rebound/guarding Extremities: Warm and well  perfused, no edema. L foot in orthopedic device. Skin: No rashes or lesions Neurologic: Alert and interactive. No gross deficits.  Assessment & Plan:  Please refer to problem based charting.

## 2015-05-04 NOTE — Patient Instructions (Addendum)
Continue to eat a healthy diet and exercise as tolerated.  10 to 35 percent of your daily calories should come from protein (1 gram of protein has 4 calories). You can eat a variety of protein-rich foods, including fish, lean meat (such as poultry, eggs, beans, peas, soy products), and unsalted nuts and seeds. For 74 to 60 year old women, daily calories are about 1600 for sedentary lifestyles, 1800 for moderately active lifestyles, and 2200 for active lifestyles. So for a sedentary lifestyle, you may expect 40 to 140 grams of protein per day.  Follow up in 3-6 months

## 2015-05-06 DIAGNOSIS — E11610 Type 2 diabetes mellitus with diabetic neuropathic arthropathy: Secondary | ICD-10-CM | POA: Diagnosis not present

## 2015-05-06 DIAGNOSIS — E11621 Type 2 diabetes mellitus with foot ulcer: Secondary | ICD-10-CM | POA: Diagnosis not present

## 2015-05-06 DIAGNOSIS — I10 Essential (primary) hypertension: Secondary | ICD-10-CM | POA: Diagnosis not present

## 2015-05-06 DIAGNOSIS — N183 Chronic kidney disease, stage 3 (moderate): Secondary | ICD-10-CM | POA: Diagnosis not present

## 2015-05-06 DIAGNOSIS — E1122 Type 2 diabetes mellitus with diabetic chronic kidney disease: Secondary | ICD-10-CM | POA: Diagnosis not present

## 2015-05-06 DIAGNOSIS — L97519 Non-pressure chronic ulcer of other part of right foot with unspecified severity: Secondary | ICD-10-CM | POA: Diagnosis not present

## 2015-05-08 DIAGNOSIS — I10 Essential (primary) hypertension: Secondary | ICD-10-CM | POA: Diagnosis not present

## 2015-05-08 DIAGNOSIS — E11610 Type 2 diabetes mellitus with diabetic neuropathic arthropathy: Secondary | ICD-10-CM | POA: Diagnosis not present

## 2015-05-08 DIAGNOSIS — E1122 Type 2 diabetes mellitus with diabetic chronic kidney disease: Secondary | ICD-10-CM | POA: Diagnosis not present

## 2015-05-08 DIAGNOSIS — N183 Chronic kidney disease, stage 3 (moderate): Secondary | ICD-10-CM | POA: Diagnosis not present

## 2015-05-08 DIAGNOSIS — E11621 Type 2 diabetes mellitus with foot ulcer: Secondary | ICD-10-CM | POA: Diagnosis not present

## 2015-05-08 DIAGNOSIS — L97519 Non-pressure chronic ulcer of other part of right foot with unspecified severity: Secondary | ICD-10-CM | POA: Diagnosis not present

## 2015-05-11 DIAGNOSIS — E11621 Type 2 diabetes mellitus with foot ulcer: Secondary | ICD-10-CM | POA: Diagnosis not present

## 2015-05-11 DIAGNOSIS — N183 Chronic kidney disease, stage 3 (moderate): Secondary | ICD-10-CM | POA: Diagnosis not present

## 2015-05-11 DIAGNOSIS — I10 Essential (primary) hypertension: Secondary | ICD-10-CM | POA: Diagnosis not present

## 2015-05-11 DIAGNOSIS — E11610 Type 2 diabetes mellitus with diabetic neuropathic arthropathy: Secondary | ICD-10-CM | POA: Diagnosis not present

## 2015-05-11 DIAGNOSIS — L97519 Non-pressure chronic ulcer of other part of right foot with unspecified severity: Secondary | ICD-10-CM | POA: Diagnosis not present

## 2015-05-11 DIAGNOSIS — E1122 Type 2 diabetes mellitus with diabetic chronic kidney disease: Secondary | ICD-10-CM | POA: Diagnosis not present

## 2015-05-12 DIAGNOSIS — E1122 Type 2 diabetes mellitus with diabetic chronic kidney disease: Secondary | ICD-10-CM | POA: Diagnosis not present

## 2015-05-12 DIAGNOSIS — L97519 Non-pressure chronic ulcer of other part of right foot with unspecified severity: Secondary | ICD-10-CM | POA: Diagnosis not present

## 2015-05-12 DIAGNOSIS — N183 Chronic kidney disease, stage 3 (moderate): Secondary | ICD-10-CM | POA: Diagnosis not present

## 2015-05-12 DIAGNOSIS — I10 Essential (primary) hypertension: Secondary | ICD-10-CM | POA: Diagnosis not present

## 2015-05-12 DIAGNOSIS — E11621 Type 2 diabetes mellitus with foot ulcer: Secondary | ICD-10-CM | POA: Diagnosis not present

## 2015-05-12 DIAGNOSIS — E11610 Type 2 diabetes mellitus with diabetic neuropathic arthropathy: Secondary | ICD-10-CM | POA: Diagnosis not present

## 2015-05-13 DIAGNOSIS — N183 Chronic kidney disease, stage 3 (moderate): Secondary | ICD-10-CM | POA: Diagnosis not present

## 2015-05-13 DIAGNOSIS — E1122 Type 2 diabetes mellitus with diabetic chronic kidney disease: Secondary | ICD-10-CM | POA: Diagnosis not present

## 2015-05-13 DIAGNOSIS — E11621 Type 2 diabetes mellitus with foot ulcer: Secondary | ICD-10-CM | POA: Diagnosis not present

## 2015-05-13 DIAGNOSIS — L97519 Non-pressure chronic ulcer of other part of right foot with unspecified severity: Secondary | ICD-10-CM | POA: Diagnosis not present

## 2015-05-13 DIAGNOSIS — E11610 Type 2 diabetes mellitus with diabetic neuropathic arthropathy: Secondary | ICD-10-CM | POA: Diagnosis not present

## 2015-05-13 DIAGNOSIS — I10 Essential (primary) hypertension: Secondary | ICD-10-CM | POA: Diagnosis not present

## 2015-05-15 DIAGNOSIS — E1122 Type 2 diabetes mellitus with diabetic chronic kidney disease: Secondary | ICD-10-CM | POA: Diagnosis not present

## 2015-05-15 DIAGNOSIS — N183 Chronic kidney disease, stage 3 (moderate): Secondary | ICD-10-CM | POA: Diagnosis not present

## 2015-05-15 DIAGNOSIS — E11610 Type 2 diabetes mellitus with diabetic neuropathic arthropathy: Secondary | ICD-10-CM | POA: Diagnosis not present

## 2015-05-15 DIAGNOSIS — L97519 Non-pressure chronic ulcer of other part of right foot with unspecified severity: Secondary | ICD-10-CM | POA: Diagnosis not present

## 2015-05-15 DIAGNOSIS — I10 Essential (primary) hypertension: Secondary | ICD-10-CM | POA: Diagnosis not present

## 2015-05-15 DIAGNOSIS — E11621 Type 2 diabetes mellitus with foot ulcer: Secondary | ICD-10-CM | POA: Diagnosis not present

## 2015-05-18 DIAGNOSIS — E1122 Type 2 diabetes mellitus with diabetic chronic kidney disease: Secondary | ICD-10-CM | POA: Diagnosis not present

## 2015-05-18 DIAGNOSIS — E11621 Type 2 diabetes mellitus with foot ulcer: Secondary | ICD-10-CM | POA: Diagnosis not present

## 2015-05-18 DIAGNOSIS — N183 Chronic kidney disease, stage 3 (moderate): Secondary | ICD-10-CM | POA: Diagnosis not present

## 2015-05-18 DIAGNOSIS — E11610 Type 2 diabetes mellitus with diabetic neuropathic arthropathy: Secondary | ICD-10-CM | POA: Diagnosis not present

## 2015-05-18 DIAGNOSIS — I10 Essential (primary) hypertension: Secondary | ICD-10-CM | POA: Diagnosis not present

## 2015-05-18 DIAGNOSIS — L97519 Non-pressure chronic ulcer of other part of right foot with unspecified severity: Secondary | ICD-10-CM | POA: Diagnosis not present

## 2015-05-19 DIAGNOSIS — E11610 Type 2 diabetes mellitus with diabetic neuropathic arthropathy: Secondary | ICD-10-CM | POA: Diagnosis not present

## 2015-05-19 DIAGNOSIS — E11621 Type 2 diabetes mellitus with foot ulcer: Secondary | ICD-10-CM | POA: Diagnosis not present

## 2015-05-19 DIAGNOSIS — L97519 Non-pressure chronic ulcer of other part of right foot with unspecified severity: Secondary | ICD-10-CM | POA: Diagnosis not present

## 2015-05-19 DIAGNOSIS — I10 Essential (primary) hypertension: Secondary | ICD-10-CM | POA: Diagnosis not present

## 2015-05-19 DIAGNOSIS — N183 Chronic kidney disease, stage 3 (moderate): Secondary | ICD-10-CM | POA: Diagnosis not present

## 2015-05-19 DIAGNOSIS — E1122 Type 2 diabetes mellitus with diabetic chronic kidney disease: Secondary | ICD-10-CM | POA: Diagnosis not present

## 2015-05-20 DIAGNOSIS — E11610 Type 2 diabetes mellitus with diabetic neuropathic arthropathy: Secondary | ICD-10-CM | POA: Diagnosis not present

## 2015-05-20 DIAGNOSIS — L97519 Non-pressure chronic ulcer of other part of right foot with unspecified severity: Secondary | ICD-10-CM | POA: Diagnosis not present

## 2015-05-20 DIAGNOSIS — I10 Essential (primary) hypertension: Secondary | ICD-10-CM | POA: Diagnosis not present

## 2015-05-20 DIAGNOSIS — N183 Chronic kidney disease, stage 3 (moderate): Secondary | ICD-10-CM | POA: Diagnosis not present

## 2015-05-20 DIAGNOSIS — E1122 Type 2 diabetes mellitus with diabetic chronic kidney disease: Secondary | ICD-10-CM | POA: Diagnosis not present

## 2015-05-20 DIAGNOSIS — E11621 Type 2 diabetes mellitus with foot ulcer: Secondary | ICD-10-CM | POA: Diagnosis not present

## 2015-05-21 DIAGNOSIS — E11610 Type 2 diabetes mellitus with diabetic neuropathic arthropathy: Secondary | ICD-10-CM | POA: Diagnosis not present

## 2015-05-21 DIAGNOSIS — E11621 Type 2 diabetes mellitus with foot ulcer: Secondary | ICD-10-CM | POA: Diagnosis not present

## 2015-05-21 DIAGNOSIS — I10 Essential (primary) hypertension: Secondary | ICD-10-CM | POA: Diagnosis not present

## 2015-05-21 DIAGNOSIS — N183 Chronic kidney disease, stage 3 (moderate): Secondary | ICD-10-CM | POA: Diagnosis not present

## 2015-05-21 DIAGNOSIS — L97519 Non-pressure chronic ulcer of other part of right foot with unspecified severity: Secondary | ICD-10-CM | POA: Diagnosis not present

## 2015-05-21 DIAGNOSIS — E1122 Type 2 diabetes mellitus with diabetic chronic kidney disease: Secondary | ICD-10-CM | POA: Diagnosis not present

## 2015-05-25 DIAGNOSIS — N183 Chronic kidney disease, stage 3 (moderate): Secondary | ICD-10-CM | POA: Diagnosis not present

## 2015-05-25 DIAGNOSIS — E11610 Type 2 diabetes mellitus with diabetic neuropathic arthropathy: Secondary | ICD-10-CM | POA: Diagnosis not present

## 2015-05-25 DIAGNOSIS — L97519 Non-pressure chronic ulcer of other part of right foot with unspecified severity: Secondary | ICD-10-CM | POA: Diagnosis not present

## 2015-05-25 DIAGNOSIS — E1122 Type 2 diabetes mellitus with diabetic chronic kidney disease: Secondary | ICD-10-CM | POA: Diagnosis not present

## 2015-05-25 DIAGNOSIS — Z4789 Encounter for other orthopedic aftercare: Secondary | ICD-10-CM | POA: Diagnosis not present

## 2015-05-25 DIAGNOSIS — E11621 Type 2 diabetes mellitus with foot ulcer: Secondary | ICD-10-CM | POA: Diagnosis not present

## 2015-05-25 DIAGNOSIS — I10 Essential (primary) hypertension: Secondary | ICD-10-CM | POA: Diagnosis not present

## 2015-05-27 DIAGNOSIS — E11610 Type 2 diabetes mellitus with diabetic neuropathic arthropathy: Secondary | ICD-10-CM | POA: Diagnosis not present

## 2015-05-27 DIAGNOSIS — E11621 Type 2 diabetes mellitus with foot ulcer: Secondary | ICD-10-CM | POA: Diagnosis not present

## 2015-05-27 DIAGNOSIS — L97519 Non-pressure chronic ulcer of other part of right foot with unspecified severity: Secondary | ICD-10-CM | POA: Diagnosis not present

## 2015-05-27 DIAGNOSIS — N183 Chronic kidney disease, stage 3 (moderate): Secondary | ICD-10-CM | POA: Diagnosis not present

## 2015-05-27 DIAGNOSIS — I10 Essential (primary) hypertension: Secondary | ICD-10-CM | POA: Diagnosis not present

## 2015-05-27 DIAGNOSIS — E1122 Type 2 diabetes mellitus with diabetic chronic kidney disease: Secondary | ICD-10-CM | POA: Diagnosis not present

## 2015-05-29 DIAGNOSIS — I10 Essential (primary) hypertension: Secondary | ICD-10-CM | POA: Diagnosis not present

## 2015-05-29 DIAGNOSIS — E11621 Type 2 diabetes mellitus with foot ulcer: Secondary | ICD-10-CM | POA: Diagnosis not present

## 2015-05-29 DIAGNOSIS — E11610 Type 2 diabetes mellitus with diabetic neuropathic arthropathy: Secondary | ICD-10-CM | POA: Diagnosis not present

## 2015-05-29 DIAGNOSIS — L97519 Non-pressure chronic ulcer of other part of right foot with unspecified severity: Secondary | ICD-10-CM | POA: Diagnosis not present

## 2015-05-29 DIAGNOSIS — N183 Chronic kidney disease, stage 3 (moderate): Secondary | ICD-10-CM | POA: Diagnosis not present

## 2015-05-29 DIAGNOSIS — E1122 Type 2 diabetes mellitus with diabetic chronic kidney disease: Secondary | ICD-10-CM | POA: Diagnosis not present

## 2015-06-02 DIAGNOSIS — E11621 Type 2 diabetes mellitus with foot ulcer: Secondary | ICD-10-CM | POA: Diagnosis not present

## 2015-06-02 DIAGNOSIS — L97519 Non-pressure chronic ulcer of other part of right foot with unspecified severity: Secondary | ICD-10-CM | POA: Diagnosis not present

## 2015-06-02 DIAGNOSIS — E11610 Type 2 diabetes mellitus with diabetic neuropathic arthropathy: Secondary | ICD-10-CM | POA: Diagnosis not present

## 2015-06-02 DIAGNOSIS — I10 Essential (primary) hypertension: Secondary | ICD-10-CM | POA: Diagnosis not present

## 2015-06-02 DIAGNOSIS — N183 Chronic kidney disease, stage 3 (moderate): Secondary | ICD-10-CM | POA: Diagnosis not present

## 2015-06-02 DIAGNOSIS — E1122 Type 2 diabetes mellitus with diabetic chronic kidney disease: Secondary | ICD-10-CM | POA: Diagnosis not present

## 2015-06-04 DIAGNOSIS — E11621 Type 2 diabetes mellitus with foot ulcer: Secondary | ICD-10-CM | POA: Diagnosis not present

## 2015-06-04 DIAGNOSIS — E1122 Type 2 diabetes mellitus with diabetic chronic kidney disease: Secondary | ICD-10-CM | POA: Diagnosis not present

## 2015-06-04 DIAGNOSIS — I10 Essential (primary) hypertension: Secondary | ICD-10-CM | POA: Diagnosis not present

## 2015-06-04 DIAGNOSIS — E11610 Type 2 diabetes mellitus with diabetic neuropathic arthropathy: Secondary | ICD-10-CM | POA: Diagnosis not present

## 2015-06-04 DIAGNOSIS — L97519 Non-pressure chronic ulcer of other part of right foot with unspecified severity: Secondary | ICD-10-CM | POA: Diagnosis not present

## 2015-06-04 DIAGNOSIS — N183 Chronic kidney disease, stage 3 (moderate): Secondary | ICD-10-CM | POA: Diagnosis not present

## 2015-07-23 ENCOUNTER — Ambulatory Visit (INDEPENDENT_AMBULATORY_CARE_PROVIDER_SITE_OTHER): Payer: Medicare Other | Admitting: Pulmonary Disease

## 2015-07-23 ENCOUNTER — Encounter: Payer: Self-pay | Admitting: Pulmonary Disease

## 2015-07-23 VITALS — BP 134/63 | HR 76 | Temp 98.3°F | Wt 279.6 lb

## 2015-07-23 DIAGNOSIS — Z79899 Other long term (current) drug therapy: Secondary | ICD-10-CM | POA: Diagnosis not present

## 2015-07-23 DIAGNOSIS — Z7984 Long term (current) use of oral hypoglycemic drugs: Secondary | ICD-10-CM | POA: Diagnosis not present

## 2015-07-23 DIAGNOSIS — I10 Essential (primary) hypertension: Secondary | ICD-10-CM | POA: Diagnosis not present

## 2015-07-23 DIAGNOSIS — E559 Vitamin D deficiency, unspecified: Secondary | ICD-10-CM

## 2015-07-23 DIAGNOSIS — D649 Anemia, unspecified: Secondary | ICD-10-CM | POA: Diagnosis not present

## 2015-07-23 DIAGNOSIS — E119 Type 2 diabetes mellitus without complications: Secondary | ICD-10-CM

## 2015-07-23 DIAGNOSIS — E1169 Type 2 diabetes mellitus with other specified complication: Secondary | ICD-10-CM

## 2015-07-23 DIAGNOSIS — Z1211 Encounter for screening for malignant neoplasm of colon: Secondary | ICD-10-CM

## 2015-07-23 LAB — GLUCOSE, CAPILLARY: Glucose-Capillary: 181 mg/dL — ABNORMAL HIGH (ref 65–99)

## 2015-07-23 MED ORDER — ATORVASTATIN CALCIUM 40 MG PO TABS: 40.0000 mg | ORAL_TABLET | Freq: Every day | ORAL | Status: DC

## 2015-07-23 MED ORDER — GLUCOSE BLOOD VI STRP: ORAL_STRIP | Status: DC

## 2015-07-23 MED ORDER — LISINOPRIL 5 MG PO TABS: 5.0000 mg | ORAL_TABLET | Freq: Every day | ORAL | Status: DC

## 2015-07-23 MED ORDER — ACCU-CHEK SOFTCLIX LANCETS MISC: Status: DC

## 2015-07-23 MED ORDER — SITAGLIPTIN PHOSPHATE 50 MG PO TABS: 50.0000 mg | ORAL_TABLET | Freq: Every day | ORAL | Status: DC

## 2015-07-23 MED ORDER — METFORMIN HCL 500 MG PO TABS: 500.0000 mg | ORAL_TABLET | Freq: Two times a day (BID) | ORAL | Status: DC

## 2015-07-23 MED ORDER — FERROUS SULFATE 325 (65 FE) MG PO TABS: 325.0000 mg | ORAL_TABLET | Freq: Every day | ORAL | Status: DC

## 2015-07-23 MED ORDER — ACCU-CHEK AVIVA DEVI: Status: DC

## 2015-07-23 MED ORDER — PANTOPRAZOLE SODIUM 20 MG PO TBEC: 20.0000 mg | DELAYED_RELEASE_TABLET | Freq: Every day | ORAL | Status: DC

## 2015-07-23 NOTE — Patient Instructions (Signed)
It was a pleasure seeing you today!  Continue to take your medications as prescribed. Continue to eat healthy and exercise.  Please follow up in 3-6 months.

## 2015-07-23 NOTE — Progress Notes (Signed)
   HPI:  [name] is a 60 year old woman with history of hypertension, OSA on CPAP, GERD, diabetes type 2, CKD stage III presenting for follow-up of her hypertension.  She is doing well. She has follow up with her orthopedic surgeon on Monday. Has been ambulating without difficulty.  Past Medical History  Diagnosis Date  . Glaucoma   . GERD (gastroesophageal reflux disease)   . H/O hiatal hernia   . OSA on CPAP     uses CPAP every night  . MRSA (methicillin resistant Staphylococcus aureus)   . Normocytic anemia   . Arthritis     "right leg" (01/30/2013)  . Type II diabetes mellitus (HCC)   . Hyperlipidemia   . Hypertension   . CKD (chronic kidney disease), stage III     Review of Systems:   Constitutional: no fevers/chills Respiratory: no shortness of breath Cardiovascular: no chest pain   Physical Exam:  Filed Vitals:   07/23/15 1337 07/23/15 1339 07/23/15 1410  BP:  178/104 134/63  Pulse:  76   Temp: 98.3 F (36.8 C)    TempSrc: Oral    Weight: 279 lb 9.6 oz (126.826 kg)    SpO2: 100% 100%    General Apperance: NAD HEENT: Normocephalic, atraumatic, anicteric sclera Neck: Supple, trachea midline Lungs: Clear to auscultation bilaterally. No wheezes, rhonchi or rales. Heart: Regular rate and rhythm Abdomen: Soft, nontender, nondistended, no rebound/guarding Extremities: Warm and well perfused Neurologic: Alert and interactive. No gross deficits.  Assessment & Plan:   See encounters tab for problem based medical decision making.   Patient discussed with Dr. Heide Spark

## 2015-07-23 NOTE — Assessment & Plan Note (Signed)
History of chronic normocytic anemia related to CKD stage 3. Had surgery in February with acute blood loss and started on iron supplementation.   Check ferritin and CBC today. If ferritin >100, will discontinue oral supplementation Colon cancer screening cards provided

## 2015-07-23 NOTE — Assessment & Plan Note (Addendum)
Assessment: Random blood glucose 180s. Just had lunch. Tries to eat healthy. Blood glucose at home 90s to 120s.fasting. Has been walking for exercise. No more episodes of hypoglycemia.  Plan: Foot exam done today Continue metformin 500mg  BID and Januvia 50mg  daily

## 2015-07-23 NOTE — Assessment & Plan Note (Signed)
Assessment: BP today initially 178/104 but on recheck it was 134/63. She was decreased at last visit from lisinopril 10mg  daily to 5mg  daily.  Plan: Continue lisinopril 5mg  daily Recheck BMP today

## 2015-07-23 NOTE — Assessment & Plan Note (Signed)
>>  ASSESSMENT AND PLAN FOR NORMOCYTIC ANEMIA WRITTEN ON 07/23/2015  3:01 PM BY KRALL, JENNIFER T, MD  History of chronic normocytic anemia related to CKD stage 3. Had surgery in February with acute blood loss and started on iron supplementation.   Check ferritin and CBC today. If ferritin >100, will discontinue oral supplementation Colon cancer screening cards provided

## 2015-07-23 NOTE — Assessment & Plan Note (Signed)
History of vitamin D deficiency on supplementation. No recheck found.  Recheck vitamin D level today.

## 2015-07-24 LAB — BMP8+ANION GAP
Anion Gap: 18 mmol/L (ref 10.0–18.0)
BUN / CREAT RATIO: 21 (ref 9–23)
BUN: 33 mg/dL — AB (ref 6–24)
CHLORIDE: 97 mmol/L (ref 96–106)
CO2: 25 mmol/L (ref 18–29)
Calcium: 9.6 mg/dL (ref 8.7–10.2)
Creatinine, Ser: 1.55 mg/dL — ABNORMAL HIGH (ref 0.57–1.00)
GFR, EST AFRICAN AMERICAN: 42 mL/min/{1.73_m2} — AB (ref >59)
GFR, EST NON AFRICAN AMERICAN: 36 mL/min/{1.73_m2} — AB (ref >59)
GLUCOSE: 151 mg/dL — AB (ref 65–99)
Potassium: 4.7 mmol/L (ref 3.5–5.2)
Sodium: 140 mmol/L (ref 134–144)

## 2015-07-24 LAB — CBC
HEMATOCRIT: 33.9 % — AB (ref 34.0–46.6)
Hemoglobin: 10.5 g/dL — ABNORMAL LOW (ref 11.1–15.9)
MCH: 27 pg (ref 26.6–33.0)
MCHC: 31 g/dL — AB (ref 31.5–35.7)
MCV: 87 fL (ref 79–97)
Platelets: 231 10*3/uL (ref 150–379)
RBC: 3.89 x10E6/uL (ref 3.77–5.28)
RDW: 16.3 % — AB (ref 12.3–15.4)
WBC: 7.4 10*3/uL (ref 3.4–10.8)

## 2015-07-24 LAB — VITAMIN D 25 HYDROXY (VIT D DEFICIENCY, FRACTURES): VIT D 25 HYDROXY: 36 ng/mL (ref 30.0–100.0)

## 2015-07-24 LAB — FERRITIN: Ferritin: 43 ng/mL (ref 15–150)

## 2015-07-27 DIAGNOSIS — E11610 Type 2 diabetes mellitus with diabetic neuropathic arthropathy: Secondary | ICD-10-CM | POA: Diagnosis not present

## 2015-07-27 DIAGNOSIS — Z4789 Encounter for other orthopedic aftercare: Secondary | ICD-10-CM | POA: Diagnosis not present

## 2015-07-27 NOTE — Progress Notes (Signed)
[facility] Medicine Clinic Attending  Case discussed with Dr. Krall at the time of the visit.  We reviewed the resident's history and exam and pertinent patient test results.  I agree with the assessment, diagnosis, and plan of care documented in the resident's note.  

## 2015-07-29 ENCOUNTER — Other Ambulatory Visit: Payer: Self-pay | Admitting: Pulmonary Disease

## 2015-09-19 ENCOUNTER — Other Ambulatory Visit: Payer: Self-pay | Admitting: Pulmonary Disease

## 2015-10-06 ENCOUNTER — Other Ambulatory Visit: Payer: Self-pay | Admitting: *Deleted

## 2015-10-06 DIAGNOSIS — E1169 Type 2 diabetes mellitus with other specified complication: Secondary | ICD-10-CM

## 2015-10-07 MED ORDER — SITAGLIPTIN PHOSPHATE 50 MG PO TABS: 50.0000 mg | ORAL_TABLET | Freq: Every day | ORAL | 3 refills | Status: DC

## 2015-10-07 MED ORDER — METFORMIN HCL 500 MG PO TABS: 500.0000 mg | ORAL_TABLET | Freq: Two times a day (BID) | ORAL | 3 refills | Status: DC

## 2015-11-17 DIAGNOSIS — H401132 Primary open-angle glaucoma, bilateral, moderate stage: Secondary | ICD-10-CM | POA: Diagnosis not present

## 2015-11-19 ENCOUNTER — Other Ambulatory Visit: Payer: Self-pay | Admitting: Pulmonary Disease

## 2015-12-18 ENCOUNTER — Encounter: Payer: Self-pay | Admitting: Pulmonary Disease

## 2015-12-23 ENCOUNTER — Telehealth: Payer: Self-pay | Admitting: Pulmonary Disease

## 2015-12-23 NOTE — Telephone Encounter (Signed)
APT. REMINDER CALL, LMTCB °

## 2015-12-24 ENCOUNTER — Encounter: Payer: Self-pay | Admitting: Pulmonary Disease

## 2015-12-24 ENCOUNTER — Ambulatory Visit (INDEPENDENT_AMBULATORY_CARE_PROVIDER_SITE_OTHER): Payer: Medicare Other | Admitting: Pulmonary Disease

## 2015-12-24 ENCOUNTER — Encounter (INDEPENDENT_AMBULATORY_CARE_PROVIDER_SITE_OTHER): Payer: Self-pay

## 2015-12-24 VITALS — BP 137/82 | HR 83 | Temp 98.3°F | Ht 69.0 in | Wt 282.6 lb

## 2015-12-24 DIAGNOSIS — N183 Chronic kidney disease, stage 3 (moderate): Secondary | ICD-10-CM

## 2015-12-24 DIAGNOSIS — Z23 Encounter for immunization: Secondary | ICD-10-CM

## 2015-12-24 DIAGNOSIS — Z7984 Long term (current) use of oral hypoglycemic drugs: Secondary | ICD-10-CM

## 2015-12-24 DIAGNOSIS — E1169 Type 2 diabetes mellitus with other specified complication: Secondary | ICD-10-CM

## 2015-12-24 DIAGNOSIS — Z1211 Encounter for screening for malignant neoplasm of colon: Secondary | ICD-10-CM

## 2015-12-24 DIAGNOSIS — Z87891 Personal history of nicotine dependence: Secondary | ICD-10-CM

## 2015-12-24 DIAGNOSIS — Z9989 Dependence on other enabling machines and devices: Secondary | ICD-10-CM

## 2015-12-24 DIAGNOSIS — I10 Essential (primary) hypertension: Secondary | ICD-10-CM

## 2015-12-24 DIAGNOSIS — G4733 Obstructive sleep apnea (adult) (pediatric): Secondary | ICD-10-CM | POA: Diagnosis not present

## 2015-12-24 DIAGNOSIS — E1122 Type 2 diabetes mellitus with diabetic chronic kidney disease: Secondary | ICD-10-CM | POA: Diagnosis not present

## 2015-12-24 DIAGNOSIS — I129 Hypertensive chronic kidney disease with stage 1 through stage 4 chronic kidney disease, or unspecified chronic kidney disease: Secondary | ICD-10-CM | POA: Diagnosis not present

## 2015-12-24 DIAGNOSIS — Z79899 Other long term (current) drug therapy: Secondary | ICD-10-CM | POA: Diagnosis not present

## 2015-12-24 DIAGNOSIS — Z0000 Encounter for general adult medical examination without abnormal findings: Secondary | ICD-10-CM

## 2015-12-24 LAB — GLUCOSE, CAPILLARY: Glucose-Capillary: 185 mg/dL — ABNORMAL HIGH (ref 65–99)

## 2015-12-24 LAB — POCT GLYCOSYLATED HEMOGLOBIN (HGB A1C): HEMOGLOBIN A1C: 7.4

## 2015-12-24 MED ORDER — ZOSTER VACCINE LIVE 19400 UNT/0.65ML ~~LOC~~ SUSR: 0.6500 mL | Freq: Once | SUBCUTANEOUS | 0 refills | Status: AC

## 2015-12-24 NOTE — Patient Instructions (Addendum)
Please continue to take your medications. Complete the colon cancer screening cards and mail them in. Get your Shingles vaccine done at your convenience at any pharmacy. Make an appointment for pap smear at your convenience. Please follow up in 6 months or sooner as needed.   Protein Content in Foods Generally, most healthy people need around 50 grams of protein each day. Depending on your overall health, you may need more or less protein in your diet. Talk to your health care provider or dietitian about how much protein you need.  See the following list for the protein content of some common foods.  High-protein foods High-protein foods contain 4 grams (4 g) or more of protein per serving. They include:  Beef, ground sirloin (cooked) - 3 oz have 24 g of protein.  Cheese (hard) - 1 oz has 7 g of protein.  Chicken breast, boneless and skinless (cooked) - 3 oz have 13.4 g of protein.  Cottage cheese - 1/2 cup has 13.4 g of protein.  Egg - 1 egg has 6 g of protein.  Fish, filet (cooked) - 1 oz has 6-7 g of protein.  Garbanzo beans (canned or cooked) - 1/2 cup has 6-7 g of protein.  Kidney beans (canned or cooked) - 1/2 cup has 6-7 g of protein.  Lamb (cooked) - 3 oz has 24 g of protein.  Milk - 1 cup (8 oz) has 8 g of protein.  Nuts (peanuts, pistachios, almonds) - 1 oz has 6 g of protein.  Peanut butter - 1 oz has 7-8 g of protein.  Pork tenderloin (cooked) - 3 oz has 18.4 g of protein.  Pumpkin seeds - 1 oz has 8.5 g of protein.  Soybeans (roasted) - 1 oz has 8 g of protein.  Soybeans (cooked) - 1/2 cup has 11 g of protein.  Soy milk - 1 cup (8 oz) has 5-10 g of protein.  Soy or vegetable patty - 1 patty has 11 g of protein.  Sunflower seeds - 1 oz has 5.5 g of protein.  Tofu (firm) - 1/2 cup has 20 g of protein.  Tuna (canned in water) - 3 oz has 20 g of protein.  Yogurt - 6 oz has 8 g of protein.  Low-protein foods Low-protein foods contain 3 grams (3 g)  or less of protein per serving. They include:  Beets (raw or cooked) - 1/2 cup has 1.5 g of protein.  Bran cereal - 1/2 cup has 2-3 g of protein.  Bread - 1 slice has 2.5 g of protein.  Broccoli (raw or cooked) - 1/2 cup has 2 g of protein.  Collard greens (raw or cooked) - 1/2 cup has 2 g of protein.  Corn (fresh or cooked) - 1/2 cup has 2 g of protein.  Cream cheese - 1 oz has 2 g of protein.  Creamer (half-and-half) - 1 oz has 1 g of protein.  Flour tortilla - 1 tortilla has 2.5 g of protein  Frozen yogurt - 1/2 cup has 3 g of protein.  Fruit or vegetable juice - 1/2 cup has 1 g of protein.  Green beans (raw or cooked) - 1/2 cup has 1 g of protein.  Green peas (canned) - 1/2 cup has 3.5 g of protein.  Muffins - 1 small muffin (2 oz) has 3 g of protein.  Oatmeal (cooked) - 1/2 cup has 3 g of protein.  Potato (baked with skin) - 1 medium potato has 3 g of  protein.  Rice (cooked) - 1/2 cup has 2.5-3.5 g of protein.  Sour cream - 1/2 cup has 2.5 g of protein.  Spinach (cooked) - 1/2 cup has 3 g of protein.  Squash (cooked) - 1/2 cup has 1.5 g of protein. Actual amounts of protein may be different depending on processing.

## 2015-12-24 NOTE — Progress Notes (Addendum)
   CC: Diabetes follow up  HPI:  [name] is a 60 y.o. with history of hypertension, OSA on CPAP, GERD, diabetes type 2, CKD stage III presenting for follow-up of her diabetes.  She is doing well overall. Her left foot surgery recovery is going well. She will see her surgeon in April.   She was having pink tinged phlegm that has resolved. She had been coughing a lot but this has improved. Her energy level level was low. She thinks she had a respiratory infection but is recovering.   Past Medical History:  Diagnosis Date  . Chronic normocytic anemia   . CKD (chronic kidney disease) stage 3, GFR 30-59 ml/min 02/02/2013  . Diabetes mellitus type II, controlled (HCC) 06/17/2009  . Essential hypertension 06/17/2009  . GERD (gastroesophageal reflux disease)   . Glaucoma   . H/O hiatal hernia   . Hyperlipidemia   . MRSA (methicillin resistant Staphylococcus aureus)   . Obstructive sleep apnea 05/26/2010    Review of Systems:   No fevers or chills No chest pain  Physical Exam:  Vitals:   12/24/15 1333  BP: 137/82  Pulse: 83  Temp: 98.3 F (36.8 C)  TempSrc: Oral  SpO2: 100%  Weight: 282 lb 9.6 oz (128.2 kg)  Height: 5\' 9"  (1.753 m)   General Apperance: NAD HEENT: Normocephalic, atraumatic, anicteric sclera Neck: Supple, trachea midline Lungs: Clear to auscultation bilaterally. No wheezes, rhonchi or rales. Breathing comfortably Heart: Regular rate and rhythm, no murmur/rub/gallop Abdomen: Soft, nontender, nondistended, no rebound/guarding Extremities: Warm and well perfused, no edema Skin: No rashes or lesions Neurologic: Alert and interactive. No gross deficits.   Assessment & Plan:   See Encounters Tab for problem based charting.  Patient discussed with Dr. 

## 2015-12-25 MED ORDER — SITAGLIPTIN PHOSPHATE 50 MG PO TABS: 50.0000 mg | ORAL_TABLET | Freq: Every day | ORAL | 3 refills | Status: DC

## 2015-12-25 MED ORDER — METFORMIN HCL 500 MG PO TABS: 500.0000 mg | ORAL_TABLET | Freq: Two times a day (BID) | ORAL | 3 refills | Status: DC

## 2015-12-25 MED ORDER — PANTOPRAZOLE SODIUM 20 MG PO TBEC: 20.0000 mg | DELAYED_RELEASE_TABLET | Freq: Every day | ORAL | 3 refills | Status: DC

## 2015-12-25 MED ORDER — LISINOPRIL 5 MG PO TABS: 5.0000 mg | ORAL_TABLET | Freq: Every day | ORAL | 3 refills | Status: DC

## 2015-12-25 NOTE — Assessment & Plan Note (Addendum)
Assessment: Hemoglobin A1c deteriorated from 6.3 to 7.4.  Plan: -Discussed lifestyle changes including weight loss -Continue metformin 500 mg twice a day -Continue Januvia 50 mg daily -She had her eye exam done on October 31

## 2015-12-25 NOTE — Addendum Note (Signed)
Addended by: Griffin Basil T on: 12/25/2015 01:03 PM   Modules accepted: Orders

## 2015-12-25 NOTE — Progress Notes (Signed)
[facility] Medicine Clinic Attending  Case discussed with Dr. Krall at the time of the visit.  We reviewed the resident's history and exam and pertinent patient test results.  I agree with the assessment, diagnosis, and plan of care documented in the resident's note.  
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
## 2015-12-25 NOTE — Assessment & Plan Note (Signed)
Assessment: BP 137/82 within goal  Plan: Continue lisinopril 5 mg daily

## 2015-12-25 NOTE — Assessment & Plan Note (Signed)
She would like to have her Pap smear done at a different appointment Influenza vaccine administered Zostavax prescription provided Colon cancer screening cards provided

## 2016-02-10 ENCOUNTER — Other Ambulatory Visit: Payer: Self-pay | Admitting: Pulmonary Disease

## 2016-02-10 DIAGNOSIS — G4733 Obstructive sleep apnea (adult) (pediatric): Secondary | ICD-10-CM

## 2016-02-10 NOTE — Progress Notes (Unsigned)
New DME placed for CPAP.

## 2016-02-12 ENCOUNTER — Other Ambulatory Visit: Payer: Self-pay | Admitting: Family Medicine

## 2016-02-12 DIAGNOSIS — Z1231 Encounter for screening mammogram for malignant neoplasm of breast: Secondary | ICD-10-CM

## 2016-02-22 ENCOUNTER — Ambulatory Visit: Payer: Medicare Other

## 2016-02-29 ENCOUNTER — Other Ambulatory Visit (HOSPITAL_COMMUNITY)
Admission: RE | Admit: 2016-02-29 | Discharge: 2016-02-29 | Disposition: A | Discharge status: Home / Self Care | Payer: Medicare Other | Source: Ambulatory Visit | Attending: Internal Medicine | Admitting: Internal Medicine

## 2016-02-29 ENCOUNTER — Ambulatory Visit (INDEPENDENT_AMBULATORY_CARE_PROVIDER_SITE_OTHER): Payer: Medicare Other | Admitting: Pulmonary Disease

## 2016-02-29 ENCOUNTER — Encounter (INDEPENDENT_AMBULATORY_CARE_PROVIDER_SITE_OTHER): Payer: Self-pay

## 2016-02-29 ENCOUNTER — Encounter: Payer: Self-pay | Admitting: Pulmonary Disease

## 2016-02-29 VITALS — BP 128/65 | HR 71 | Temp 97.7°F | Ht 69.0 in | Wt 282.0 lb

## 2016-02-29 DIAGNOSIS — Z01419 Encounter for gynecological examination (general) (routine) without abnormal findings: Secondary | ICD-10-CM | POA: Diagnosis not present

## 2016-02-29 DIAGNOSIS — J069 Acute upper respiratory infection, unspecified: Secondary | ICD-10-CM

## 2016-02-29 DIAGNOSIS — Z124 Encounter for screening for malignant neoplasm of cervix: Secondary | ICD-10-CM

## 2016-02-29 DIAGNOSIS — Z1211 Encounter for screening for malignant neoplasm of colon: Secondary | ICD-10-CM

## 2016-02-29 DIAGNOSIS — Z87891 Personal history of nicotine dependence: Secondary | ICD-10-CM

## 2016-02-29 DIAGNOSIS — Z1151 Encounter for screening for human papillomavirus (HPV): Secondary | ICD-10-CM | POA: Insufficient documentation

## 2016-02-29 DIAGNOSIS — I10 Essential (primary) hypertension: Secondary | ICD-10-CM | POA: Diagnosis not present

## 2016-02-29 DIAGNOSIS — Z79899 Other long term (current) drug therapy: Secondary | ICD-10-CM

## 2016-02-29 DIAGNOSIS — Z0000 Encounter for general adult medical examination without abnormal findings: Secondary | ICD-10-CM

## 2016-02-29 LAB — POC HEMOCCULT BLD/STL (HOME/3-CARD/SCREEN)
Card #3 Fecal Occult Blood, POC: NEGATIVE
FECAL OCCULT BLD: NEGATIVE
FECAL OCCULT BLD: NEGATIVE

## 2016-02-29 MED ORDER — METFORMIN HCL 500 MG PO TABS: 500.0000 mg | ORAL_TABLET | Freq: Two times a day (BID) | ORAL | 3 refills | Status: DC

## 2016-02-29 MED ORDER — PANTOPRAZOLE SODIUM 20 MG PO TBEC: 20.0000 mg | DELAYED_RELEASE_TABLET | Freq: Every day | ORAL | 3 refills | Status: DC

## 2016-02-29 MED ORDER — LISINOPRIL 5 MG PO TABS: 5.0000 mg | ORAL_TABLET | Freq: Every day | ORAL | 3 refills | Status: DC

## 2016-02-29 MED ORDER — ATORVASTATIN CALCIUM 40 MG PO TABS: 40.0000 mg | ORAL_TABLET | Freq: Every day | ORAL | 3 refills | Status: DC

## 2016-02-29 MED ORDER — FERROUS SULFATE 325 (65 FE) MG PO TABS: 325.0000 mg | ORAL_TABLET | Freq: Every day | ORAL | 3 refills | Status: DC

## 2016-02-29 MED ORDER — SITAGLIPTIN PHOSPHATE 50 MG PO TABS: 50.0000 mg | ORAL_TABLET | Freq: Every day | ORAL | 3 refills | Status: DC

## 2016-02-29 NOTE — Patient Instructions (Signed)
Follow up as scheduled, please.

## 2016-02-29 NOTE — Assessment & Plan Note (Signed)
Assessment: Cough and nasal drainage for 3 days. Resolving. She mentioned that she had some hyperglycemia during this. May be from infection but could also be from the cough drops she was using.   Plan: Continue supportive care. Consider sugar free cough drops.

## 2016-02-29 NOTE — Progress Notes (Signed)
   CC: pap smear  HPI:  [name] is a 61 year old woman with hypertension, OSA on CPAP, GERD, diabetes type 2, CKD stage III presenting for pap smear  She had upper respiratory infection symptoms that started on Friday. She has a productive cough and nasal drainage. Feels that it is improving.   Past Medical History:  Diagnosis Date  . Chronic normocytic anemia   . CKD (chronic kidney disease) stage 3, GFR 30-59 ml/min 02/02/2013  . Diabetes mellitus type II, controlled (HCC) 06/17/2009  . Essential hypertension 06/17/2009  . GERD (gastroesophageal reflux disease)   . Glaucoma   . H/O hiatal hernia   . Hyperlipidemia   . MRSA (methicillin resistant Staphylococcus aureus)   . Obstructive sleep apnea 05/26/2010    Review of Systems:   No fevers or chills No chest pain  Physical Exam:  Vitals:   02/29/16 0856  BP: 128/65  Pulse: 71  Temp: 97.7 F (36.5 C)  TempSrc: Oral  Weight: 282 lb (127.9 kg)   General Apperance: NAD HEENT: Normocephalic, atraumatic, anicteric sclera Neck: Supple, trachea midline Lungs: Clear to auscultation bilaterally. No wheezes, rhonchi or rales. Breathing comfortably Heart: Regular rate and rhythm, no murmur/rub/gallop Abdomen: Soft, nontender, nondistended, no rebound/guarding GU: Mild bleeding of cervical os with scraping from pap smear, otherwise, no obvious lesions or discharge Extremities: Warm and well perfused Skin: No rashes or lesions Neurologic: Alert and interactive. No gross deficits.   Assessment & Plan:   See Encounters Tab for problem based charting.  Patient discussed with Dr. Cleda Daub

## 2016-02-29 NOTE — Assessment & Plan Note (Signed)
Pap smear done 

## 2016-02-29 NOTE — Assessment & Plan Note (Signed)
BP at goal today 128/65. Refilled lisinopril 5mg  daily

## 2016-03-01 NOTE — Progress Notes (Signed)
[facility] Medicine Clinic Attending  Case discussed with Dr. Krall at the time of the visit.  We reviewed the resident's history and exam and pertinent patient test results.  I agree with the assessment, diagnosis, and plan of care documented in the resident's note.  

## 2016-03-03 LAB — CYTOLOGY - PAP
DIAGNOSIS: NEGATIVE
HPV: NOT DETECTED

## 2016-03-16 ENCOUNTER — Encounter: Payer: Self-pay | Admitting: Pulmonary Disease

## 2016-03-18 ENCOUNTER — Ambulatory Visit
Admission: RE | Admit: 2016-03-18 | Discharge: 2016-03-18 | Disposition: A | Discharge status: Home / Self Care | Payer: Medicare Other | Source: Ambulatory Visit | Attending: Family Medicine | Admitting: Family Medicine

## 2016-03-18 DIAGNOSIS — Z1231 Encounter for screening mammogram for malignant neoplasm of breast: Secondary | ICD-10-CM | POA: Diagnosis not present

## 2016-04-13 DIAGNOSIS — E119 Type 2 diabetes mellitus without complications: Secondary | ICD-10-CM | POA: Diagnosis not present

## 2016-04-13 LAB — HM DIABETES EYE EXAM

## 2016-05-11 DIAGNOSIS — E11610 Type 2 diabetes mellitus with diabetic neuropathic arthropathy: Secondary | ICD-10-CM | POA: Diagnosis not present

## 2016-06-01 ENCOUNTER — Ambulatory Visit (INDEPENDENT_AMBULATORY_CARE_PROVIDER_SITE_OTHER): Payer: Medicare Other | Admitting: Podiatry

## 2016-06-01 ENCOUNTER — Encounter: Payer: Self-pay | Admitting: Podiatry

## 2016-06-01 DIAGNOSIS — B351 Tinea unguium: Secondary | ICD-10-CM

## 2016-06-01 DIAGNOSIS — M79676 Pain in unspecified toe(s): Secondary | ICD-10-CM | POA: Diagnosis not present

## 2016-06-01 DIAGNOSIS — E0842 Diabetes mellitus due to underlying condition with diabetic polyneuropathy: Secondary | ICD-10-CM

## 2016-06-02 NOTE — Progress Notes (Signed)
   Subjective: Patient with PMHx of left foot reconstructive surgery presents today for possible treatment and evaluation of left great toenail discoloration and thickness. Patient states that the nails have been discolored and thickened for greater than 1 month. Patient presents today for further treatment and evaluation. She states she wants diabetic shoes  Objective: Physical Exam General: The patient is alert and oriented x3 in no acute distress.  Dermatology: Hyperkeratotic, discolored, thickened, onychodystrophy of left great toenail. Skin is warm, dry and supple bilateral lower extremities. Negative for open lesions or macerations.  Vascular: Palpable pedal pulses bilaterally. No edema or erythema noted. Capillary refill within normal limits.  Neurological: Epicritic and protective threshold grossly intact bilaterally.   Musculoskeletal Exam: Range of motion within normal limits to all pedal and ankle joints bilateral. Muscle strength 5/5 in all groups bilateral.   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 #2 Mechanical debridement of left great toenail performed using a nail nipper. Filed with dremel without incident.  #3 authorization for DM shoes initiated. #4 patient molded for DM insoles and fitted for shoes. #5 return to clinic in 6 weeks.   Felecia Shelling, DPM [facility]  Dr. Felecia Shelling, DPM    [address]                                        [place], [place] [address]                Office [phone]  Fax [phone]

## 2016-06-21 DIAGNOSIS — H2512 Age-related nuclear cataract, left eye: Secondary | ICD-10-CM | POA: Diagnosis not present

## 2016-06-21 DIAGNOSIS — H25043 Posterior subcapsular polar age-related cataract, bilateral: Secondary | ICD-10-CM | POA: Diagnosis not present

## 2016-06-21 DIAGNOSIS — H02839 Dermatochalasis of unspecified eye, unspecified eyelid: Secondary | ICD-10-CM | POA: Diagnosis not present

## 2016-06-21 DIAGNOSIS — H2513 Age-related nuclear cataract, bilateral: Secondary | ICD-10-CM | POA: Diagnosis not present

## 2016-06-21 DIAGNOSIS — H25013 Cortical age-related cataract, bilateral: Secondary | ICD-10-CM | POA: Diagnosis not present

## 2016-06-23 ENCOUNTER — Ambulatory Visit (INDEPENDENT_AMBULATORY_CARE_PROVIDER_SITE_OTHER): Payer: Medicare Other | Admitting: Pulmonary Disease

## 2016-06-23 ENCOUNTER — Encounter: Payer: Self-pay | Admitting: Pulmonary Disease

## 2016-06-23 VITALS — BP 171/82 | HR 67 | Temp 98.4°F | Ht 69.0 in | Wt 284.4 lb

## 2016-06-23 DIAGNOSIS — N183 Chronic kidney disease, stage 3 unspecified: Secondary | ICD-10-CM

## 2016-06-23 DIAGNOSIS — Z87891 Personal history of nicotine dependence: Secondary | ICD-10-CM | POA: Diagnosis not present

## 2016-06-23 DIAGNOSIS — K219 Gastro-esophageal reflux disease without esophagitis: Secondary | ICD-10-CM | POA: Diagnosis not present

## 2016-06-23 DIAGNOSIS — H401130 Primary open-angle glaucoma, bilateral, stage unspecified: Secondary | ICD-10-CM | POA: Diagnosis not present

## 2016-06-23 DIAGNOSIS — I10 Essential (primary) hypertension: Secondary | ICD-10-CM

## 2016-06-23 DIAGNOSIS — E1122 Type 2 diabetes mellitus with diabetic chronic kidney disease: Secondary | ICD-10-CM

## 2016-06-23 DIAGNOSIS — E1136 Type 2 diabetes mellitus with diabetic cataract: Secondary | ICD-10-CM

## 2016-06-23 DIAGNOSIS — G4733 Obstructive sleep apnea (adult) (pediatric): Secondary | ICD-10-CM

## 2016-06-23 DIAGNOSIS — Z7984 Long term (current) use of oral hypoglycemic drugs: Secondary | ICD-10-CM

## 2016-06-23 DIAGNOSIS — E1169 Type 2 diabetes mellitus with other specified complication: Secondary | ICD-10-CM | POA: Diagnosis not present

## 2016-06-23 DIAGNOSIS — E1139 Type 2 diabetes mellitus with other diabetic ophthalmic complication: Secondary | ICD-10-CM

## 2016-06-23 DIAGNOSIS — Z9119 Patient's noncompliance with other medical treatment and regimen: Secondary | ICD-10-CM | POA: Diagnosis not present

## 2016-06-23 DIAGNOSIS — Z79899 Other long term (current) drug therapy: Secondary | ICD-10-CM | POA: Diagnosis not present

## 2016-06-23 DIAGNOSIS — E113293 Type 2 diabetes mellitus with mild nonproliferative diabetic retinopathy without macular edema, bilateral: Secondary | ICD-10-CM

## 2016-06-23 DIAGNOSIS — E11610 Type 2 diabetes mellitus with diabetic neuropathic arthropathy: Secondary | ICD-10-CM

## 2016-06-23 DIAGNOSIS — I129 Hypertensive chronic kidney disease with stage 1 through stage 4 chronic kidney disease, or unspecified chronic kidney disease: Secondary | ICD-10-CM | POA: Diagnosis not present

## 2016-06-23 LAB — GLUCOSE, CAPILLARY: GLUCOSE-CAPILLARY: 103 mg/dL — AB (ref 65–99)

## 2016-06-23 LAB — POCT GLYCOSYLATED HEMOGLOBIN (HGB A1C): HEMOGLOBIN A1C: 7.3

## 2016-06-23 MED ORDER — ATORVASTATIN CALCIUM 40 MG PO TABS: 40.0000 mg | ORAL_TABLET | Freq: Every day | ORAL | 3 refills | Status: DC

## 2016-06-23 MED ORDER — PANTOPRAZOLE SODIUM 20 MG PO TBEC: 20.0000 mg | DELAYED_RELEASE_TABLET | Freq: Every day | ORAL | 0 refills | Status: DC

## 2016-06-23 MED ORDER — METFORMIN HCL 500 MG PO TABS: 500.0000 mg | ORAL_TABLET | Freq: Two times a day (BID) | ORAL | 3 refills | Status: DC

## 2016-06-23 MED ORDER — LISINOPRIL 10 MG PO TABS: 10.0000 mg | ORAL_TABLET | Freq: Every day | ORAL | 1 refills | Status: DC

## 2016-06-23 MED ORDER — LISINOPRIL 5 MG PO TABS: 5.0000 mg | ORAL_TABLET | Freq: Every day | ORAL | 3 refills | Status: DC

## 2016-06-23 MED ORDER — FERROUS SULFATE 325 (65 FE) MG PO TABS: 325.0000 mg | ORAL_TABLET | Freq: Every day | ORAL | 3 refills | Status: DC

## 2016-06-23 MED ORDER — SITAGLIPTIN PHOSPHATE 50 MG PO TABS: 50.0000 mg | ORAL_TABLET | Freq: Every day | ORAL | 3 refills | Status: DC

## 2016-06-23 NOTE — Progress Notes (Signed)
   CC: DM2 follow up  HPI:  [name] is a 61 year old woman with hypertension, OSA on CPAP, GERD, diabetes type 2, CKD stage III presenting for follow up of her DM2.   She has been having sinus problems.  She has been having more problems with reflux. She increased her protonix to twice a day. No weight loss. Plans on exercising more when her boot is taken off. She has not identified a food trigger. No hematochezia or melena. No nausea or vomiting.   She was seen by London Sheer OD 05/13/2015 and noted to have bilateral cortical cataracts that are stable, primary open angle glaucoma, mild background retinopathy bilaterally without macular edema. She will have surgery soon for her cataracts.     Past Medical History:  Diagnosis Date  . Chronic normocytic anemia   . CKD (chronic kidney disease) stage 3, GFR 30-59 ml/min 02/02/2013  . Diabetes mellitus type II, controlled (HCC) 06/17/2009  . Essential hypertension 06/17/2009  . GERD (gastroesophageal reflux disease)   . Glaucoma   . H/O hiatal hernia   . Hyperlipidemia   . MRSA (methicillin resistant Staphylococcus aureus)   . Obstructive sleep apnea 05/26/2010    Review of Systems:   No chest pain No dyspnea  Physical Exam:  Vitals:   06/23/16 1326  BP: (!) 177/74  Pulse: 68  Temp: 98.4 F (36.9 C)  TempSrc: Oral  SpO2: 98%  Weight: 284 lb 6.4 oz (129 kg)  Height: 5\' 9"  (1.753 m)   General Apperance: NAD HEENT: Normocephalic, atraumatic, anicteric sclera Neck: Supple, trachea midline Lungs: Clear to auscultation bilaterally. No wheezes, rhonchi or rales. Breathing comfortably Heart: Regular rate and rhythm, no murmur/rub/gallop Abdomen: Soft, nontender, nondistended, no rebound/guarding Extremities: Warm and well perfused, no edema, L LE in boot from post charcot foot surgery Skin: No rashes or lesions Neurologic: Alert and interactive. No gross deficits.   Assessment & Plan:   See Encounters Tab for problem  based charting.  Patient discussed with Dr. 

## 2016-06-23 NOTE — Progress Notes (Signed)
Medicine attending: Medical history, presenting problems, physical findings, and medications, reviewed with resident physician Dr Jennifer Krall on the day of the patient visit and I concur with her evaluation and management plan. 

## 2016-06-23 NOTE — Assessment & Plan Note (Signed)
Assessment: BP 177/74. Uncontrolled.  Plan: Increase lisinopril from 5 mg to 10 mg daily. Follow-up in 2-3 weeks for blood pressure recheck

## 2016-06-23 NOTE — Assessment & Plan Note (Signed)
Assessment: She has increased symptoms from her GERD. Improved with increasing her Protonix from 20 mg daily to 40 mg daily. No red flags.  Plan: Continue Protonix 40 mg daily for now. Encouraged her to keep a food diary to identify triggers. Discussed dietary and lifestyle changes that may help with controlling her GERD.

## 2016-06-23 NOTE — Patient Instructions (Addendum)
It was a pleasure seeing you today!  Increase your lisinopril dose from 1 tablet daily (5mg ) to 2 tablets daily (10 mg) until you run out of your current bottle. When you go pick up your new prescription, take 1 tablet daily (10 mg).  Your A1c is stable. Keep making changes to your diet to improve your diabetes. Consider changing your protein supplement to Glucerna.  Follow up in 2-3 weeks for a blood pressure recheck.  Food Choices for Gastroesophageal Reflux Disease, Adult When you have gastroesophageal reflux disease (GERD), the foods you eat and your eating habits are very important. Choosing the right foods can help ease your discomfort. What guidelines do I need to follow?  Choose fruits, vegetables, whole grains, and low-fat dairy products.  Choose low-fat meat, fish, and poultry.  Limit fats such as oils, salad dressings, butter, nuts, and avocado.  Keep a food diary. This helps you identify foods that cause symptoms.  Avoid foods that cause symptoms. These may be different for everyone.  Eat small meals often instead of 3 large meals a day.  Eat your meals slowly, in a place where you are relaxed.  Limit fried foods.  Cook foods using methods other than frying.  Avoid drinking alcohol.  Avoid drinking large amounts of liquids with your meals.  Avoid bending over or lying down until 2-3 hours after eating. What foods are not recommended? These are some foods and drinks that may make your symptoms worse: Vegetables Tomatoes. Tomato juice. Tomato and spaghetti sauce. Chili peppers. Onion and garlic. Horseradish. Fruits Oranges, grapefruit, and lemon (fruit and juice). Meats High-fat meats, fish, and poultry. This includes hot dogs, ribs, ham, sausage, salami, and bacon. Dairy Whole milk and chocolate milk. Sour cream. Cream. Butter. Ice cream. Cream cheese. Drinks Coffee and tea. Bubbly (carbonated) drinks or energy drinks. Condiments Hot sauce. Barbecue  sauce. Sweets/Desserts Chocolate and cocoa. Donuts. Peppermint and spearmint. Fats and Oils High-fat foods. This includes fries and potato chips. Other Vinegar. Strong spices. This includes black pepper, white pepper, red pepper, cayenne, curry powder, cloves, ginger, and chili powder. The items listed above may not be a complete list of foods and drinks to avoid. Contact your dietitian for more information. This information is not intended to replace advice given to you by your health care provider. Make sure you discuss any questions you have with your health care provider. Document Released: 07/05/2011 Document Revised: 06/11/2015 Document Reviewed: 11/07/2012 Elsevier Interactive Patient Education  2017 2018.

## 2016-06-23 NOTE — Assessment & Plan Note (Signed)
Recheck BMP.

## 2016-06-23 NOTE — Assessment & Plan Note (Signed)
Assessment: She is continuing to follow-up with her orthopedic clinic. Plan is to have it removed later this month.  Plan: Discussed foot care.

## 2016-06-23 NOTE — Assessment & Plan Note (Signed)
Assessment: Hgb A1c 7.3 which is stable.  Plan: Continue metformin 500 mg twice a day, sitagliptin 50 mg daily. We discussed dietary changes to help with her diabetes control. Recheck hemoglobin A1c in 6 months.

## 2016-06-24 LAB — LIPID PANEL
CHOL/HDL RATIO: 2.2 ratio (ref 0.0–4.4)
Cholesterol, Total: 89 mg/dL — ABNORMAL LOW (ref 100–199)
HDL: 41 mg/dL (ref >39)
LDL Calculated: 35 mg/dL (ref 0–99)
Triglycerides: 64 mg/dL (ref 0–149)
VLDL CHOLESTEROL CAL: 13 mg/dL (ref 5–40)

## 2016-06-24 LAB — BMP8+ANION GAP
Anion Gap: 13 mmol/L (ref 10.0–18.0)
BUN/Creatinine Ratio: 18 (ref 12–28)
BUN: 26 mg/dL (ref 8–27)
CALCIUM: 10.3 mg/dL (ref 8.7–10.3)
CO2: 26 mmol/L (ref 18–29)
Chloride: 103 mmol/L (ref 96–106)
Creatinine, Ser: 1.41 mg/dL — ABNORMAL HIGH (ref 0.57–1.00)
GFR, EST AFRICAN AMERICAN: 47 mL/min/{1.73_m2} — AB (ref >59)
GFR, EST NON AFRICAN AMERICAN: 41 mL/min/{1.73_m2} — AB (ref >59)
Glucose: 96 mg/dL (ref 65–99)
Potassium: 5.2 mmol/L (ref 3.5–5.2)
Sodium: 142 mmol/L (ref 134–144)

## 2016-06-24 LAB — SPECIMEN STATUS

## 2016-06-27 ENCOUNTER — Other Ambulatory Visit: Payer: Self-pay | Admitting: *Deleted

## 2016-06-27 MED ORDER — PANTOPRAZOLE SODIUM 20 MG PO TBEC: 20.0000 mg | DELAYED_RELEASE_TABLET | Freq: Two times a day (BID) | ORAL | 0 refills | Status: DC

## 2016-06-27 NOTE — Telephone Encounter (Signed)
Pharmacy requesting clarification. Patient stated she should be taking 1 tab BID. pls review & resend, thanks!

## 2016-06-28 ENCOUNTER — Encounter: Payer: Self-pay | Admitting: *Deleted

## 2016-07-01 ENCOUNTER — Encounter: Payer: Self-pay | Admitting: *Deleted

## 2016-07-01 DIAGNOSIS — H25812 Combined forms of age-related cataract, left eye: Secondary | ICD-10-CM | POA: Diagnosis not present

## 2016-07-01 DIAGNOSIS — H2511 Age-related nuclear cataract, right eye: Secondary | ICD-10-CM | POA: Diagnosis not present

## 2016-07-01 DIAGNOSIS — H2512 Age-related nuclear cataract, left eye: Secondary | ICD-10-CM | POA: Diagnosis not present

## 2016-07-01 DIAGNOSIS — H2589 Other age-related cataract: Secondary | ICD-10-CM | POA: Diagnosis not present

## 2016-07-06 ENCOUNTER — Encounter: Payer: Self-pay | Admitting: Pulmonary Disease

## 2016-07-06 ENCOUNTER — Other Ambulatory Visit: Payer: Medicare Other

## 2016-07-07 ENCOUNTER — Encounter: Payer: Self-pay | Admitting: Internal Medicine

## 2016-07-07 ENCOUNTER — Ambulatory Visit (INDEPENDENT_AMBULATORY_CARE_PROVIDER_SITE_OTHER): Payer: Medicare Other | Admitting: Internal Medicine

## 2016-07-07 VITALS — BP 126/69 | HR 64 | Temp 98.0°F | Ht 69.0 in | Wt 280.8 lb

## 2016-07-07 DIAGNOSIS — I10 Essential (primary) hypertension: Secondary | ICD-10-CM | POA: Diagnosis not present

## 2016-07-07 DIAGNOSIS — I129 Hypertensive chronic kidney disease with stage 1 through stage 4 chronic kidney disease, or unspecified chronic kidney disease: Secondary | ICD-10-CM

## 2016-07-07 DIAGNOSIS — N183 Chronic kidney disease, stage 3 (moderate): Secondary | ICD-10-CM | POA: Diagnosis not present

## 2016-07-07 DIAGNOSIS — Z79899 Other long term (current) drug therapy: Secondary | ICD-10-CM

## 2016-07-07 DIAGNOSIS — Z87891 Personal history of nicotine dependence: Secondary | ICD-10-CM

## 2016-07-07 NOTE — Addendum Note (Signed)
Addended by: Griffin Basil T on: 07/07/2016 04:47 PM   Modules accepted: Orders

## 2016-07-07 NOTE — Assessment & Plan Note (Addendum)
Assessment Patient is here for a follow-up of her uncontrolled hypertension. Dose of lisinopril was increased from 5 mg daily to 10 mg daily during her previous visit 2 weeks ago. She does have a history of CKD stage III and creatinine was 1.4 (at baseline) during her prior visit. Potassium was borderline high at 5.2 on labs. Initial blood pressure at this visit 144/75 and repeat 126/69. Patient denies having any headaches, chest pain, shortness of breath, or abdominal pain. No other complaints.  Plan -Continue lisinopril 10 mg daily -BMP to check renal function and potassium level  Addendum 07/11/2016: Creatinine 1.5 (close to baseline) and GFR 42. Mildly hyperkalemic (potassium 5.3). -Discontinue lisinopril due to hyperkalemia -Start hydrochlorothiazide 12.5 mg daily -Return to clinic in 2-3 weeks. Repeat BMP at next visit. -Spoke to the patient over the phone and she agrees with the treatment plan.

## 2016-07-07 NOTE — Progress Notes (Signed)
   CC: Patient is here for a follow-up of her uncontrolled hypertension.  HPI:  [name] is a 61 y.o. female with a past medical history of conditions listed below presenting to the clinic for a follow-up of her uncontrolled hypertension. Please see problem based charting for the status of the patient's current and chronic medical conditions.   Past Medical History:  Diagnosis Date  . Chronic normocytic anemia   . CKD (chronic kidney disease) stage 3, GFR 30-59 ml/min 02/02/2013  . Diabetes mellitus type II, controlled (HCC) 06/17/2009  . Essential hypertension 06/17/2009  . GERD (gastroesophageal reflux disease)   . Glaucoma   . H/O hiatal hernia   . Hyperlipidemia   . MRSA (methicillin resistant Staphylococcus aureus)   . Obstructive sleep apnea 05/26/2010    Review of Systems: Pertinent positives mentioned in HPI. Remainder of all ROS negative.   Physical Exam:  Vitals:   07/07/16 1324 07/07/16 1338  BP: (!) 144/75 126/69  Pulse: 67 64  Temp: 98 F (36.7 C)   TempSrc: Oral   SpO2: 100%   Weight: 280 lb 12.8 oz (127.4 kg)   Height: 5\' 9"  (1.753 m)    Physical Exam  Constitutional: She is oriented to person, place, and time. She appears well-developed and well-nourished. No distress.  HENT:  Head: Normocephalic and atraumatic.  Eyes: Right eye exhibits no discharge. Left eye exhibits no discharge.  Cardiovascular: Normal rate, regular rhythm and intact distal pulses.   Pulmonary/Chest: Effort normal and breath sounds normal. No respiratory distress. She has no wheezes. She has no rales.  Abdominal: Soft. Bowel sounds are normal. She exhibits no distension. There is no tenderness.  Musculoskeletal:  Left lower extremity in a boot due to history of surgical debridement of a diabetic ulcer. No edema noted in the right lower extremity.  Neurological: She is alert and oriented to person, place, and time.  Skin: Skin is warm and dry.  Psychiatric: She has a normal  mood and affect. Her behavior is normal.    Assessment & Plan:   See Encounters Tab for problem based charting.  Patient discussed with Dr. 

## 2016-07-07 NOTE — Assessment & Plan Note (Signed)
Has not used her CPAP machine in a long time. Will need to be retested to make sure we have correct settings.

## 2016-07-07 NOTE — Patient Instructions (Addendum)
Ms. Cortner it was nice meeting you today.  -Continue taking Lisinopril 10 mg daily.  -Return for a follow-up visit in 3 months.

## 2016-07-07 NOTE — Progress Notes (Signed)
Internal Medicine Clinic Attending  Case discussed with Dr. Rathoreat the time of the visit. We reviewed the resident's history and exam and pertinent patient test results. I agree with the assessment, diagnosis, and plan of care documented in the resident's note.  

## 2016-07-08 LAB — BMP8+ANION GAP
Anion Gap: 12 mmol/L (ref 10.0–18.0)
BUN/Creatinine Ratio: 14 (ref 12–28)
BUN: 21 mg/dL (ref 8–27)
CO2: 26 mmol/L (ref 20–29)
Calcium: 9.9 mg/dL (ref 8.7–10.3)
Chloride: 103 mmol/L (ref 96–106)
Creatinine, Ser: 1.53 mg/dL — ABNORMAL HIGH (ref 0.57–1.00)
GFR, EST AFRICAN AMERICAN: 42 mL/min/{1.73_m2} — AB (ref >59)
GFR, EST NON AFRICAN AMERICAN: 37 mL/min/{1.73_m2} — AB (ref >59)
Glucose: 110 mg/dL — ABNORMAL HIGH (ref 65–99)
POTASSIUM: 5.3 mmol/L — AB (ref 3.5–5.2)
SODIUM: 141 mmol/L (ref 134–144)

## 2016-07-11 MED ORDER — HYDROCHLOROTHIAZIDE 12.5 MG PO CAPS: 12.5000 mg | ORAL_CAPSULE | Freq: Every day | ORAL | 0 refills | Status: DC

## 2016-07-11 NOTE — Addendum Note (Signed)
Addended by: John Giovanni on: 07/11/2016 05:39 PM   Modules accepted: Orders

## 2016-07-15 DIAGNOSIS — H25811 Combined forms of age-related cataract, right eye: Secondary | ICD-10-CM | POA: Diagnosis not present

## 2016-07-15 DIAGNOSIS — H2511 Age-related nuclear cataract, right eye: Secondary | ICD-10-CM | POA: Diagnosis not present

## 2016-07-21 ENCOUNTER — Ambulatory Visit (INDEPENDENT_AMBULATORY_CARE_PROVIDER_SITE_OTHER): Payer: Medicare Other | Admitting: Internal Medicine

## 2016-07-21 ENCOUNTER — Encounter: Payer: Self-pay | Admitting: Internal Medicine

## 2016-07-21 VITALS — BP 159/81 | HR 72 | Temp 98.3°F | Wt 277.1 lb

## 2016-07-21 DIAGNOSIS — Z7984 Long term (current) use of oral hypoglycemic drugs: Secondary | ICD-10-CM

## 2016-07-21 DIAGNOSIS — I129 Hypertensive chronic kidney disease with stage 1 through stage 4 chronic kidney disease, or unspecified chronic kidney disease: Secondary | ICD-10-CM

## 2016-07-21 DIAGNOSIS — Z87891 Personal history of nicotine dependence: Secondary | ICD-10-CM

## 2016-07-21 DIAGNOSIS — I10 Essential (primary) hypertension: Secondary | ICD-10-CM

## 2016-07-21 DIAGNOSIS — E1122 Type 2 diabetes mellitus with diabetic chronic kidney disease: Secondary | ICD-10-CM

## 2016-07-21 DIAGNOSIS — Z79899 Other long term (current) drug therapy: Secondary | ICD-10-CM

## 2016-07-21 DIAGNOSIS — N183 Chronic kidney disease, stage 3 unspecified: Secondary | ICD-10-CM

## 2016-07-21 DIAGNOSIS — G4733 Obstructive sleep apnea (adult) (pediatric): Secondary | ICD-10-CM | POA: Diagnosis not present

## 2016-07-21 DIAGNOSIS — E11610 Type 2 diabetes mellitus with diabetic neuropathic arthropathy: Secondary | ICD-10-CM

## 2016-07-21 DIAGNOSIS — Z0000 Encounter for general adult medical examination without abnormal findings: Secondary | ICD-10-CM

## 2016-07-21 DIAGNOSIS — E1169 Type 2 diabetes mellitus with other specified complication: Secondary | ICD-10-CM

## 2016-07-21 DIAGNOSIS — K219 Gastro-esophageal reflux disease without esophagitis: Secondary | ICD-10-CM

## 2016-07-21 MED ORDER — PANTOPRAZOLE SODIUM 20 MG PO TBEC: 20.0000 mg | DELAYED_RELEASE_TABLET | Freq: Two times a day (BID) | ORAL | 1 refills | Status: DC

## 2016-07-21 MED ORDER — METFORMIN HCL 500 MG PO TABS: 500.0000 mg | ORAL_TABLET | Freq: Two times a day (BID) | ORAL | 3 refills | Status: DC

## 2016-07-21 MED ORDER — HYDROCHLOROTHIAZIDE 12.5 MG PO CAPS: 12.5000 mg | ORAL_CAPSULE | Freq: Every day | ORAL | 1 refills | Status: DC

## 2016-07-21 NOTE — Assessment & Plan Note (Addendum)
Assessment Patient is here for follow up of hypertension after starting HCTZ 12.5mg . Patient was changed from lisinopril 10mg  to HCTZ 12.5mg  due to K 5.2 on 06/21. She has a history of CKD stage III and Cr was 1.53 (at baseline) on prior visit. BP initially 159/81, recheck was 136/80. Tolerating HCTZ well. Denies HA, blurry vision, chest pain, or shortness of breath.  Plan - Continue HCTZ 12.5mg  daily - Will re-check BMP today for renal function and potassium level. If potassium is normal, will restart lisinopril 5mg  for renal protection in diabetes. - Follow up in 1 month re: BP and potassium level

## 2016-07-21 NOTE — Progress Notes (Signed)
   CC: follow up on BP and potassium level  HPI: [name] is a 61 y.o. female with HTN, T2DM (last A1c 7.3), CKD (baseline Cr 1.4-1.5), GERD, OSA, glaucoma, and cataracts who presents for follow up of BP and potassium level.   Please see the assessment and plan below for the status of the patient's chronic medical problems.  Past Medical History:  Diagnosis Date  . Chronic normocytic anemia   . CKD (chronic kidney disease) stage 3, GFR 30-59 ml/min 02/02/2013  . Diabetes mellitus type II, controlled (HCC) 06/17/2009  . Essential hypertension 06/17/2009  . GERD (gastroesophageal reflux disease)   . Glaucoma   . H/O hiatal hernia   . Hyperlipidemia   . MRSA (methicillin resistant Staphylococcus aureus)   . Obstructive sleep apnea 05/26/2010   Review of Systems:  Constitutional: Negative for diaphoresis, fever, malaise/fatigue, and weight loss.  HEENT: Negative for congestion and sore throat. Respiratory: Negative for cough, shortness of breath and wheezing.   Cardiovascular: Negative for chest pain, palpitations, and leg swelling. Gastrointestinal: Negative for abdominal pain, changes in BM, heartburn, nausea and vomiting. Genitourinary: Negative for dysuria and hematuria.  Musculoskeletal: Negative for joint pain and myalgias.  Neurological: Negative for dizziness, focal weakness, and headaches.  Physical Exam:  Vitals:   07/21/16 1456  BP: (!) 159/81  Pulse: 72  Temp: 98.3 F (36.8 C)  TempSrc: Oral  SpO2: 100%  Weight: 277 lb 1.6 oz (125.7 kg)   GEN: Well-appearing. Alert and oriented. No acute distress.  HENT: Moist mucous membranes. No visible lesions. EYES: EOMI. Sclera anicteric. NECK: No palpable masses or LAD. RESP: Clear to auscultation bilaterally. No wheezes, rales, or rhonchi. CV: Normal rate and regular rhythm. No murmurs, gallops, or rubs. No LE edema. ABD: Soft. Non-tender. Non-distended. Normoactive bowel sounds. EXT: No clubbing, cyanosis, or  edema. Warm and well perfused. 2+ dorsalis pedis pulses bilaterally. NEURO: A&O x 3. No focal weakness. PSYCH: Patient is calm and pleasant. Appropriate affect. Well-groomed; speech is appropriate and on-subject.  Assessment & Plan:   See Encounters Tab for problem based charting.  Patient seen with Dr. Lance Muss, MD Internal Medicine, PGY-1

## 2016-07-21 NOTE — Patient Instructions (Addendum)
You received a pneumonia vaccine today.  We are checking your potassium level today. I will let you know about the results within the next couple of days.  We will see you to follow up on your BP and potassium in 1 month.   Diabetes Mellitus and Exercise Exercising regularly is important for your overall health, especially when you have diabetes (diabetes mellitus). Exercising is not only about losing weight. It has many health benefits, such as increasing muscle strength and bone density and reducing body fat and stress. This leads to improved fitness, flexibility, and endurance, all of which result in better overall health. Exercise has additional benefits for people with diabetes, including:  Reducing appetite.  Helping to lower and control blood glucose.  Lowering blood pressure.  Helping to control amounts of fatty substances (lipids) in the blood, such as cholesterol and triglycerides.  Helping the body to respond better to insulin (improving insulin sensitivity).  Reducing how much insulin the body needs.  Decreasing the risk for heart disease by: ? Lowering cholesterol and triglyceride levels. ? Increasing the levels of good cholesterol. ? Lowering blood glucose levels.  What is my activity plan? Your health care provider or certified diabetes educator can help you make a plan for the type and frequency of exercise (activity plan) that works for you. Make sure that you:  Do at least 150 minutes of moderate-intensity or vigorous-intensity exercise each week. This could be brisk walking, biking, or water aerobics. ? Do stretching and strength exercises, such as yoga or weightlifting, at least 2 times a week. ? Spread out your activity over at least 3 days of the week.  Get some form of physical activity every day. ? Do not go more than 2 days in a row without some kind of physical activity. ? Avoid being inactive for more than 90 minutes at a time. Take frequent breaks to  walk or stretch.  Choose a type of exercise or activity that you enjoy, and set realistic goals.  Start slowly, and gradually increase the intensity of your exercise over time.  What do I need to know about managing my diabetes?  Check your blood glucose before and after exercising. ? If your blood glucose is higher than 240 mg/dL (93.7 mmol/L) before you exercise, check your urine for ketones. If you have ketones in your urine, do not exercise until your blood glucose returns to normal.  Know the symptoms of low blood glucose (hypoglycemia) and how to treat it. Your risk for hypoglycemia increases during and after exercise. Common symptoms of hypoglycemia can include: ? Hunger. ? Anxiety. ? Sweating and feeling clammy. ? Confusion. ? Dizziness or feeling light-headed. ? Increased heart rate or palpitations. ? Blurry vision. ? Tingling or numbness around the mouth, lips, or tongue. ? Tremors or shakes. ? Irritability.  Keep a rapid-acting carbohydrate snack available before, during, and after exercise to help prevent or treat hypoglycemia.  Avoid injecting insulin into areas of the body that are going to be exercised. For example, avoid injecting insulin into: ? The arms, when playing tennis. ? The legs, when jogging.  Keep records of your exercise habits. Doing this can help you and your health care provider adjust your diabetes management plan as needed. Write down: ? Food that you eat before and after you exercise. ? Blood glucose levels before and after you exercise. ? The type and amount of exercise you have done. ? When your insulin is expected to peak, if you use  insulin. Avoid exercising at times when your insulin is peaking.  When you start a new exercise or activity, work with your health care provider to make sure the activity is safe for you, and to adjust your insulin, medicines, or food intake as needed.  Drink plenty of water while you exercise to prevent  dehydration or heat stroke. Drink enough fluid to keep your urine clear or pale yellow. This information is not intended to replace advice given to you by your health care provider. Make sure you discuss any questions you have with your health care provider. Document Released: 03/26/2003 Document Revised: 07/24/2015 Document Reviewed: 06/15/2015 Elsevier Interactive Patient Education  2018 [employer].

## 2016-07-21 NOTE — Assessment & Plan Note (Addendum)
Assessment On metformin 500mg  BID and sitagliptin 50mg .  Last A1c (06/2016) 7.3. Cr 1.53. Patient brought Accu-Chek report for last 30 days - average BG is 142. Follows with ophthalmology and podiatry annually.  Plan - Continue metformin 500mg  BID and sitagliptin 50mg  qday. If Cr increases, consider tapering off metformin due to risk of lactic acidosis. - Recheck hemoglobin A1c in 6 months.

## 2016-07-21 NOTE — Assessment & Plan Note (Signed)
Assessment Continuing to follow up in orthopedic clinic. Will get her shoes in the next couple weeks and transition from the boot to shoe.  Plan Continue regular follow up with orthopedic clinic.

## 2016-07-21 NOTE — Assessment & Plan Note (Signed)
Assessment On pantoprazole 40 BID. Denies heartburn symptoms.  Plan Continue pantoprazole 40 BID.

## 2016-07-21 NOTE — Assessment & Plan Note (Signed)
Recheck BMP.

## 2016-07-21 NOTE — Assessment & Plan Note (Signed)
PPSV23 administered today in clinic. PPSV 23 (2011, 2018), PCV13 (2016).

## 2016-07-21 NOTE — Assessment & Plan Note (Addendum)
Assessment Not currently using CPAP machine.  Plan Has sleep study appointment scheduled for Aug 5 to get correct settings.

## 2016-07-22 ENCOUNTER — Other Ambulatory Visit: Payer: Self-pay | Admitting: Internal Medicine

## 2016-07-22 LAB — BMP8+ANION GAP
ANION GAP: 15 mmol/L (ref 10.0–18.0)
BUN/Creatinine Ratio: 18 (ref 12–28)
BUN: 27 mg/dL (ref 8–27)
CALCIUM: 9.8 mg/dL (ref 8.7–10.3)
CHLORIDE: 98 mmol/L (ref 96–106)
CO2: 26 mmol/L (ref 20–29)
Creatinine, Ser: 1.53 mg/dL — ABNORMAL HIGH (ref 0.57–1.00)
GFR calc Af Amer: 42 mL/min/{1.73_m2} — ABNORMAL LOW (ref >59)
GFR calc non Af Amer: 37 mL/min/{1.73_m2} — ABNORMAL LOW (ref >59)
GLUCOSE: 97 mg/dL (ref 65–99)
Potassium: 4.5 mmol/L (ref 3.5–5.2)
Sodium: 139 mmol/L (ref 134–144)

## 2016-07-22 MED ORDER — LISINOPRIL 5 MG PO TABS: 5.0000 mg | ORAL_TABLET | Freq: Every day | ORAL | 0 refills | Status: DC

## 2016-07-22 NOTE — Addendum Note (Signed)
Addended by: Pearletha Alfred on: 07/22/2016 05:43 PM   Modules accepted: Orders

## 2016-07-22 NOTE — Progress Notes (Signed)
K 4.5, Cr 1.53 (at baseline). Called patient to tell her to start taking lisinopril 5mg  once daily. She confirmed and will start tomorrow. Will see her back on Aug 9 to recheck BP and BMP.

## 2016-07-22 NOTE — Progress Notes (Signed)
Internal Medicine Clinic Attending  I saw and evaluated the patient.  I personally confirmed the key portions of the history and exam documented by Dr. Huang and I reviewed pertinent patient test results.  The assessment, diagnosis, and plan were formulated together and I agree with the documentation in the resident's note.  

## 2016-07-29 DIAGNOSIS — H401132 Primary open-angle glaucoma, bilateral, moderate stage: Secondary | ICD-10-CM | POA: Diagnosis not present

## 2016-08-08 ENCOUNTER — Ambulatory Visit (INDEPENDENT_AMBULATORY_CARE_PROVIDER_SITE_OTHER): Payer: Self-pay | Admitting: Orthotics

## 2016-08-08 DIAGNOSIS — L97421 Non-pressure chronic ulcer of left heel and midfoot limited to breakdown of skin: Secondary | ICD-10-CM

## 2016-08-08 DIAGNOSIS — M14672 Charcot's joint, left ankle and foot: Secondary | ICD-10-CM

## 2016-08-08 DIAGNOSIS — E0842 Diabetes mellitus due to underlying condition with diabetic polyneuropathy: Secondary | ICD-10-CM | POA: Diagnosis not present

## 2016-08-08 DIAGNOSIS — E08621 Diabetes mellitus due to underlying condition with foot ulcer: Secondary | ICD-10-CM

## 2016-08-08 NOTE — Progress Notes (Signed)

## 2016-08-22 ENCOUNTER — Other Ambulatory Visit: Payer: Self-pay | Admitting: *Deleted

## 2016-08-22 DIAGNOSIS — I10 Essential (primary) hypertension: Secondary | ICD-10-CM

## 2016-08-22 MED ORDER — LISINOPRIL 5 MG PO TABS: 5.0000 mg | ORAL_TABLET | Freq: Every day | ORAL | 0 refills | Status: DC

## 2016-08-25 ENCOUNTER — Encounter: Payer: Self-pay | Admitting: Internal Medicine

## 2016-08-25 ENCOUNTER — Ambulatory Visit (INDEPENDENT_AMBULATORY_CARE_PROVIDER_SITE_OTHER): Payer: Medicare Other | Admitting: Internal Medicine

## 2016-08-25 VITALS — BP 132/59 | HR 66 | Temp 97.8°F | Ht 69.0 in | Wt 276.6 lb

## 2016-08-25 DIAGNOSIS — I129 Hypertensive chronic kidney disease with stage 1 through stage 4 chronic kidney disease, or unspecified chronic kidney disease: Secondary | ICD-10-CM | POA: Diagnosis not present

## 2016-08-25 DIAGNOSIS — I10 Essential (primary) hypertension: Secondary | ICD-10-CM

## 2016-08-25 DIAGNOSIS — L658 Other specified nonscarring hair loss: Secondary | ICD-10-CM | POA: Diagnosis not present

## 2016-08-25 DIAGNOSIS — E785 Hyperlipidemia, unspecified: Secondary | ICD-10-CM

## 2016-08-25 DIAGNOSIS — G4733 Obstructive sleep apnea (adult) (pediatric): Secondary | ICD-10-CM | POA: Diagnosis not present

## 2016-08-25 DIAGNOSIS — Z114 Encounter for screening for human immunodeficiency virus [HIV]: Secondary | ICD-10-CM | POA: Diagnosis not present

## 2016-08-25 DIAGNOSIS — Z0000 Encounter for general adult medical examination without abnormal findings: Secondary | ICD-10-CM

## 2016-08-25 DIAGNOSIS — E1169 Type 2 diabetes mellitus with other specified complication: Secondary | ICD-10-CM

## 2016-08-25 DIAGNOSIS — E669 Obesity, unspecified: Secondary | ICD-10-CM

## 2016-08-25 DIAGNOSIS — K219 Gastro-esophageal reflux disease without esophagitis: Secondary | ICD-10-CM

## 2016-08-25 DIAGNOSIS — E1122 Type 2 diabetes mellitus with diabetic chronic kidney disease: Secondary | ICD-10-CM | POA: Diagnosis not present

## 2016-08-25 DIAGNOSIS — Z87891 Personal history of nicotine dependence: Secondary | ICD-10-CM | POA: Diagnosis not present

## 2016-08-25 DIAGNOSIS — N183 Chronic kidney disease, stage 3 (moderate): Secondary | ICD-10-CM

## 2016-08-25 DIAGNOSIS — Z79899 Other long term (current) drug therapy: Secondary | ICD-10-CM | POA: Diagnosis not present

## 2016-08-25 DIAGNOSIS — Z7984 Long term (current) use of oral hypoglycemic drugs: Secondary | ICD-10-CM

## 2016-08-25 LAB — GLUCOSE, CAPILLARY: GLUCOSE-CAPILLARY: 167 mg/dL — AB (ref 65–99)

## 2016-08-25 MED ORDER — ACCU-CHEK SOFTCLIX LANCETS MISC: 12 refills | Status: DC

## 2016-08-25 MED ORDER — PANTOPRAZOLE SODIUM 20 MG PO TBEC: 20.0000 mg | DELAYED_RELEASE_TABLET | Freq: Two times a day (BID) | ORAL | 1 refills | Status: DC

## 2016-08-25 MED ORDER — GLUCOSE BLOOD VI STRP: ORAL_STRIP | 12 refills | Status: DC

## 2016-08-25 MED ORDER — BIMATOPROST 0.03 % OP SOLN: 1.0000 [drp] | Freq: Every day | OPHTHALMIC | 0 refills | Status: DC

## 2016-08-25 MED ORDER — ATORVASTATIN CALCIUM 40 MG PO TABS: 40.0000 mg | ORAL_TABLET | Freq: Every day | ORAL | 3 refills | Status: DC

## 2016-08-25 MED ORDER — FERROUS SULFATE 325 (65 FE) MG PO TABS: 325.0000 mg | ORAL_TABLET | Freq: Every day | ORAL | 3 refills | Status: DC

## 2016-08-25 MED ORDER — BRIMONIDINE TARTRATE-TIMOLOL 0.2-0.5 % OP SOLN: 1.0000 [drp] | Freq: Two times a day (BID) | OPHTHALMIC | 0 refills | Status: DC

## 2016-08-25 MED ORDER — SITAGLIPTIN PHOSPHATE 50 MG PO TABS: 50.0000 mg | ORAL_TABLET | Freq: Every day | ORAL | 3 refills | Status: DC

## 2016-08-25 MED ORDER — HYDROCHLOROTHIAZIDE 12.5 MG PO CAPS: 12.5000 mg | ORAL_CAPSULE | Freq: Every day | ORAL | 1 refills | Status: DC

## 2016-08-25 MED ORDER — METFORMIN HCL 500 MG PO TABS: 500.0000 mg | ORAL_TABLET | Freq: Two times a day (BID) | ORAL | 3 refills | Status: DC

## 2016-08-25 MED ORDER — LISINOPRIL 5 MG PO TABS: 5.0000 mg | ORAL_TABLET | Freq: Every day | ORAL | 1 refills | Status: DC

## 2016-08-25 NOTE — Assessment & Plan Note (Addendum)
Immunizations - HCV and HIV screening today

## 2016-08-25 NOTE — Progress Notes (Signed)
   CC: HTN management  HPI: [name] is a 61 y.o. female with HTN, DM2, CKD, HLD, OSA, and GERD who presents for 4 week follow up of HTN (BMP and BP check).  Please see the assessment and plan below for the status of the patient's chronic medical problems.  Past Medical History:  Diagnosis Date  . Chronic normocytic anemia   . CKD (chronic kidney disease) stage 3, GFR 30-59 ml/min 02/02/2013  . Diabetes mellitus type II, controlled (HCC) 06/17/2009  . Essential hypertension 06/17/2009  . GERD (gastroesophageal reflux disease)   . Glaucoma   . H/O hiatal hernia   . Hyperlipidemia   . MRSA (methicillin resistant Staphylococcus aureus)   . Obstructive sleep apnea 05/26/2010   Review of Systems:  Constitutional: Negative for diaphoresis, fever, malaise/fatigue, and weight loss. Positive for tiredness and daytime sleepiness. HEENT: Negative for blurred vision, hearing loss, sinus pain, congestion, and sore throat. Respiratory: Negative for cough, shortness of breath and wheezing.  Cardiovascular: Negative for chest pain, palpitations, orthopnea, PND, and leg swelling. Gastrointestinal: Negative for abdominal pain, blood in stool, constipation, diarrhea, heartburn, nausea and vomiting.  Genitourinary: Negative for dysuria and hematuria. Musculoskeletal: Negative for joint pain and myalgias. Neurological: Negative for dizziness, focal weakness. Positive for morning headaches.  Physical Exam:  Vitals:   08/25/16 1344  BP: (!) 132/59  Pulse: 66  Temp: 97.8 F (36.6 C)  TempSrc: Oral  SpO2: 100%  Weight: 276 lb 9.6 oz (125.5 kg)  Height: 5\' 9"  (1.753 m)   GEN: Well-appearing, pleasant, obese AA female sitting comfortably in chair in NAD. Alert and oriented. HENT: Moist mucous membranes. No visible lesions. EYES: EOMI. Sclera anicteric. RESP: Clear to auscultation bilaterally. No wheezes, rales, or rhonchi. CV: Normal rate and regular rhythm. No murmurs, gallops, or rubs.  No LE edema. ABD: Soft. Non-tender. Non-distended. Normoactive bowel sounds. EXT: No cyanosis or edema. Warm and well perfused. Boot on left foot. NEURO: Cranial nerves II-XII grossly intact. Able to lift all four extremities against gravity. PSYCH: Patient is calm and pleasant. Appropriate affect. Well-groomed; speech is appropriate and on-subject.  Assessment & Plan:   See Encounters Tab for problem based charting.  Patient seen with Dr. .  Criselda Peaches, MD Internal Medicine, PGY-1

## 2016-08-25 NOTE — Assessment & Plan Note (Signed)
Assessment BG meter with average of 140. Last A1c (06/2016) 7.3. On metformin 500mg  BID and sitagliptin 50mg  daily. Patient continues to try to watch and adjust her diet.  Plan - Continue metformin 500mg  BID and sitagliptin 50mg  daily

## 2016-08-25 NOTE — Assessment & Plan Note (Addendum)
Assessment Patient is on HCTZ 12.5mg  daily. Started lisinopril 5mg  daily at last visit (7/5). Potassium on 7/5 was 4.5. BP today was 132/59. She is complaining of hair falling out. Lisinopril is very rarely associated with alopecia (>/= 1% according to UpToDate). HCTZ is also rarely associated with alopecia, however this is dose-dependent and usually occurs at doses >25mg /day (she is on 12.5 daily). I am not sure that these medications are the cause of her alopecia, however, we discussed these rare side effects with [name] and offered a switch from Lisinopril to Losartan 25mg  once daily if she was interested (Losartan also has kidney protection for diabetics). She decided to stay on her current medications for now, to talk to her hairdresser again, and will re-address this at next visit.  Plan - Continue lisinopril 5mg  daily and HCTZ 12.5mg  daily - Recheck BMET today for K - f/u in 3 months --- Consider switching lisinopril to losartan 25mg  daily if she continues to complain of alopecia.

## 2016-08-25 NOTE — Patient Instructions (Signed)
It was a pleasure to see you today, [name]!  Your blood pressure was great today - 132/59. Keep up the exercise and good dietary habits. Also, please keep taking the lisinopril 5mg  daily and HCTZ 12.5mg  daily.  We are rechecking your potassium levels, as well as one-time screening for HIV and HCV.  Remember to make it to your sleep study appointment on August 26 so you can get your settings fixed on your CPAP machine. That should help your headaches and feeling tired throughout the day.  Please see me back in 3 months. If you have any questions or concerns in the meantime, please call the clinic.

## 2016-08-25 NOTE — Assessment & Plan Note (Addendum)
Assessment Sleep study appointment scheduled for Aug 26 to get correct settings for CPAP (not currently using it). Patient has some morning headaches and complains of daytime sleepiness/feeling tired. Using the CPAP may also help her BP.  Plan - Sleep study for CPAP machine

## 2016-08-26 ENCOUNTER — Telehealth: Payer: Self-pay | Admitting: *Deleted

## 2016-08-26 LAB — BMP8+ANION GAP
Anion Gap: 12 mmol/L (ref 10.0–18.0)
BUN / CREAT RATIO: 16 (ref 12–28)
BUN: 25 mg/dL (ref 8–27)
CO2: 25 mmol/L (ref 20–29)
Calcium: 10.1 mg/dL (ref 8.7–10.3)
Chloride: 101 mmol/L (ref 96–106)
Creatinine, Ser: 1.61 mg/dL — ABNORMAL HIGH (ref 0.57–1.00)
GFR, EST AFRICAN AMERICAN: 40 mL/min/{1.73_m2} — AB (ref >59)
GFR, EST NON AFRICAN AMERICAN: 35 mL/min/{1.73_m2} — AB (ref >59)
GLUCOSE: 170 mg/dL — AB (ref 65–99)
POTASSIUM: 4.6 mmol/L (ref 3.5–5.2)
Sodium: 138 mmol/L (ref 134–144)

## 2016-08-26 LAB — HEPATITIS C ANTIBODY: Hep C Virus Ab: 0.1 s/co ratio (ref 0.0–0.9)

## 2016-08-26 LAB — HIV ANTIBODY (ROUTINE TESTING W REFLEX): HIV Screen 4th Generation wRfx: NONREACTIVE

## 2016-08-26 NOTE — Telephone Encounter (Addendum)
Prior Authorization for Bimatoprost  0.03% was sent to CoverMyMeds.  Information to be reviewed and decision will be sent within 72 hours.  Angelina Ok, RN 08/26/2016 3:53 PM.  Fax from [facility] patient has been approved for Bimatoprost 0.03% Eye drops until 01/16/2017.  Angelina Ok, RN 08/29/2016 9:36 AM

## 2016-08-26 NOTE — Progress Notes (Signed)
Internal Medicine Clinic Attending  I saw and evaluated the patient.  I personally confirmed the key portions of the history and exam documented by Dr. Huang and I reviewed pertinent patient test results.  The assessment, diagnosis, and plan were formulated together and I agree with the documentation in the resident's note.  

## 2016-09-11 ENCOUNTER — Ambulatory Visit (HOSPITAL_BASED_OUTPATIENT_CLINIC_OR_DEPARTMENT_OTHER): Payer: Medicare Other | Attending: Internal Medicine | Admitting: Internal Medicine

## 2016-09-11 VITALS — Ht 69.0 in | Wt 275.0 lb

## 2016-09-11 DIAGNOSIS — I493 Ventricular premature depolarization: Secondary | ICD-10-CM | POA: Insufficient documentation

## 2016-09-11 DIAGNOSIS — Z79899 Other long term (current) drug therapy: Secondary | ICD-10-CM | POA: Insufficient documentation

## 2016-09-11 DIAGNOSIS — R0683 Snoring: Secondary | ICD-10-CM | POA: Diagnosis not present

## 2016-09-11 DIAGNOSIS — G4733 Obstructive sleep apnea (adult) (pediatric): Secondary | ICD-10-CM | POA: Insufficient documentation

## 2016-09-17 DIAGNOSIS — G4733 Obstructive sleep apnea (adult) (pediatric): Secondary | ICD-10-CM

## 2016-09-17 NOTE — Procedures (Signed)
  Patient Name: [name], [name] Date: 09/11/2016 Gender: Female D.O.B: 12-06-1955 Age (years): 60 Referring Provider: Debe Coder Height (inches): 69 Interpreting Physician: Jetty Duhamel MD, ABSM Weight (lbs): 275 RPSGT: Armen Pickup BMI: 41 MRN: [id number] Neck Size: 15.00 CLINICAL INFORMATION Sleep Study Type: NPSG  Indication for sleep study: OSA  Epworth Sleepiness Score: 13  Most recent polysomnogram dated 09/28/2011 revealed an AHI of 14.1/h. Most recent titration study dated 10/25/2011 was optimal at 22.16cm H2O with an AHI of 32.9/h.  SLEEP STUDY TECHNIQUE As per the AASM Manual for the Scoring of Sleep and Associated Events v2.3 (April 2016) with a hypopnea requiring 4% desaturations.  The channels recorded and monitored were frontal, central and occipital EEG, electrooculogram (EOG), submentalis EMG (chin), nasal and oral airflow, thoracic and abdominal wall motion, anterior tibialis EMG, snore microphone, electrocardiogram, and pulse oximetry.  MEDICATIONS Medications self-administered by patient taken the night of the study : LUMIGAN  SLEEP ARCHITECTURE The study was initiated at 10:42:33 PM and ended at 4:43:59 AM.  Sleep onset time was 41.4 minutes and the sleep efficiency was 78.4%. The total sleep time was 283.5 minutes.  Stage REM latency was 192.5 minutes.  The patient spent 3.00% of the night in stage N1 sleep, 74.07% in stage N2 sleep, 0.00% in stage N3 and 22.93% in REM.  Alpha intrusion was absent.  Supine sleep was 84.91%.  RESPIRATORY PARAMETERS The overall apnea/hypopnea index (AHI) was 13.8 per hour. There were 45 total apneas, including 42 obstructive, 3 central and 0 mixed apneas. There were 20 hypopneas and 59 RERAs.  The AHI during Stage REM sleep was 33.2 per hour.  AHI while supine was 13.2 per hour.  The mean oxygen saturation was 95.50%. The minimum SpO2 during sleep was 86.00%.  Moderate snoring was noted during this  study.  CARDIAC DATA The 2 lead EKG demonstrated sinus rhythm. The mean heart rate was 66.55 beats per minute. Other EKG findings include: PVCs.  LEG MOVEMENT DATA The total PLMS were 0 with a resulting PLMS index of 0.00. Associated arousal with leg movement index was 0.0 .  IMPRESSIONS - Mild obstructive sleep apnea occurred during this study (AHI = 13.8/h). - Insufficient early events to meet protocol requirement for split CPAP titration. - No significant central sleep apnea occurred during this study (CAI = 0.6/h). - Mild oxygen desaturation was noted during this study (Min O2 = 86.00%, Mean 95.5%). - The patient snored with Moderate snoring volume. - EKG findings include PVCs. - Clinically significant periodic limb movements did not occur during sleep. No significant associated arousals.  DIAGNOSIS - Obstructive Sleep Apnea (327.23 [G47.33 ICD-10])  RECOMMENDATIONS - Therapeutic CPAP titration to determine optimal pressure required to alleviate sleep disordered breathing. - Be careful with alcohol, sedatives and other CNS depressants that may worsen sleep apnea and disrupt normal sleep architecture. - Sleep hygiene should be reviewed to assess factors that may improve sleep quality. - Weight management and regular exercise should be initiated or continued if appropriate.  [Electronically signed] 09/17/2016 09:58 AM  Jetty Duhamel MD, ABSM Diplomate, American Board of Sleep Medicine   NPI: [id number]  Waymon Budge Diplomate, American Board of Sleep Medicine  ELECTRONICALLY SIGNED ON:  09/17/2016, 9:55 AM [facility] PH: [phone]   FX: [phone] ACCREDITED BY THE AMERICAN ACADEMY OF SLEEP MEDICINE

## 2016-10-13 DIAGNOSIS — H401132 Primary open-angle glaucoma, bilateral, moderate stage: Secondary | ICD-10-CM | POA: Diagnosis not present

## 2016-11-01 ENCOUNTER — Telehealth: Payer: Self-pay | Admitting: *Deleted

## 2016-11-01 NOTE — Telephone Encounter (Signed)
Received fax request from [facility] for Lisinopril 10 mg. Per OV note 08/25/16, was ordered 5 mg daily. Spoke to pharmacist & informed last written date & dose. Should have plenty of refills. I was told this (10 mg dose) request will be discarded & patient will be informed by them.

## 2016-11-15 DIAGNOSIS — H04121 Dry eye syndrome of right lacrimal gland: Secondary | ICD-10-CM | POA: Diagnosis not present

## 2016-11-15 DIAGNOSIS — H401132 Primary open-angle glaucoma, bilateral, moderate stage: Secondary | ICD-10-CM | POA: Diagnosis not present

## 2016-11-15 DIAGNOSIS — H04122 Dry eye syndrome of left lacrimal gland: Secondary | ICD-10-CM | POA: Diagnosis not present

## 2016-11-22 IMAGING — RF DG ANKLE COMPLETE 3+V*L*
1 series · 4 of 4 positions shown · non-contrast
Comparison: Radiographs 08/29/2014

CLINICAL DATA: Left Achilles tendon lengthening procedure.

EXAM:
LEFT ANKLE COMPLETE - 3+ VIEW

[Series 1: run · 4 of 4 slices shown]
[im 1/4]
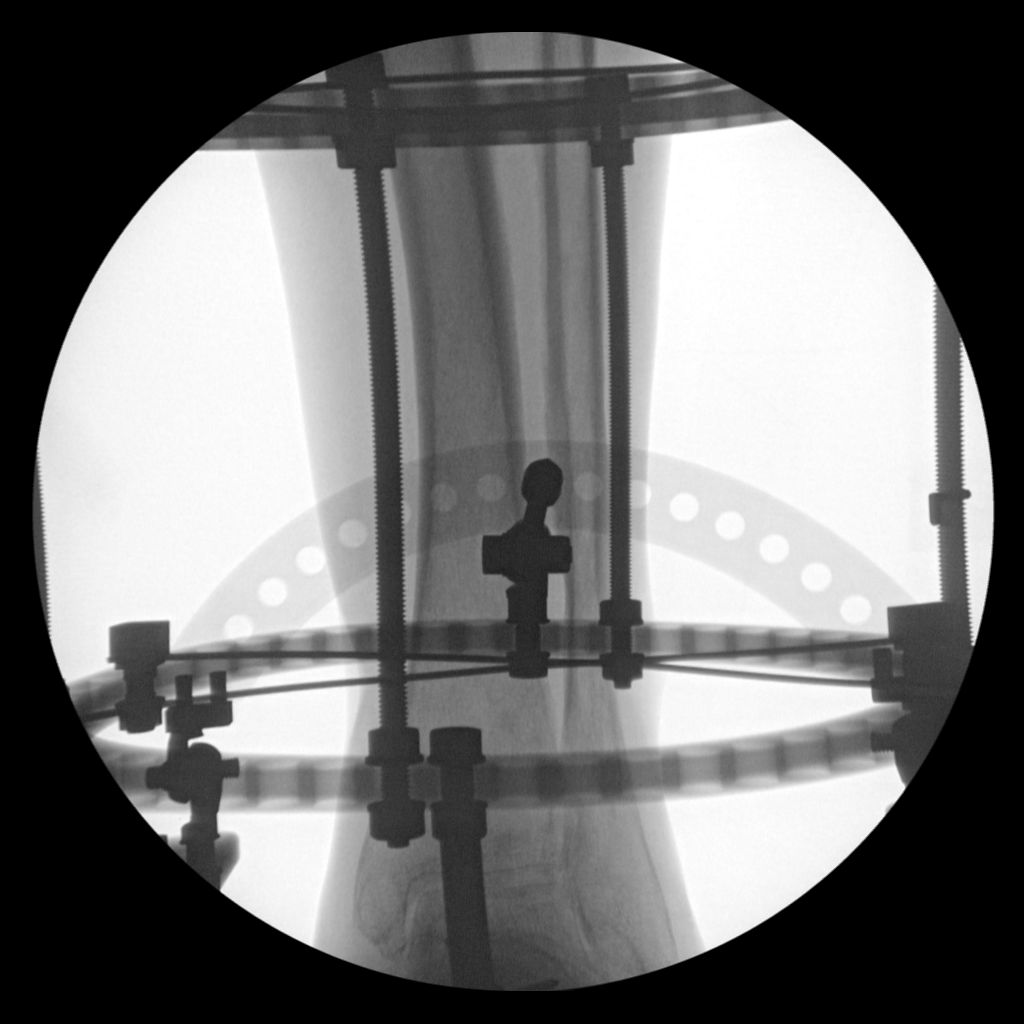
[im 2/4]
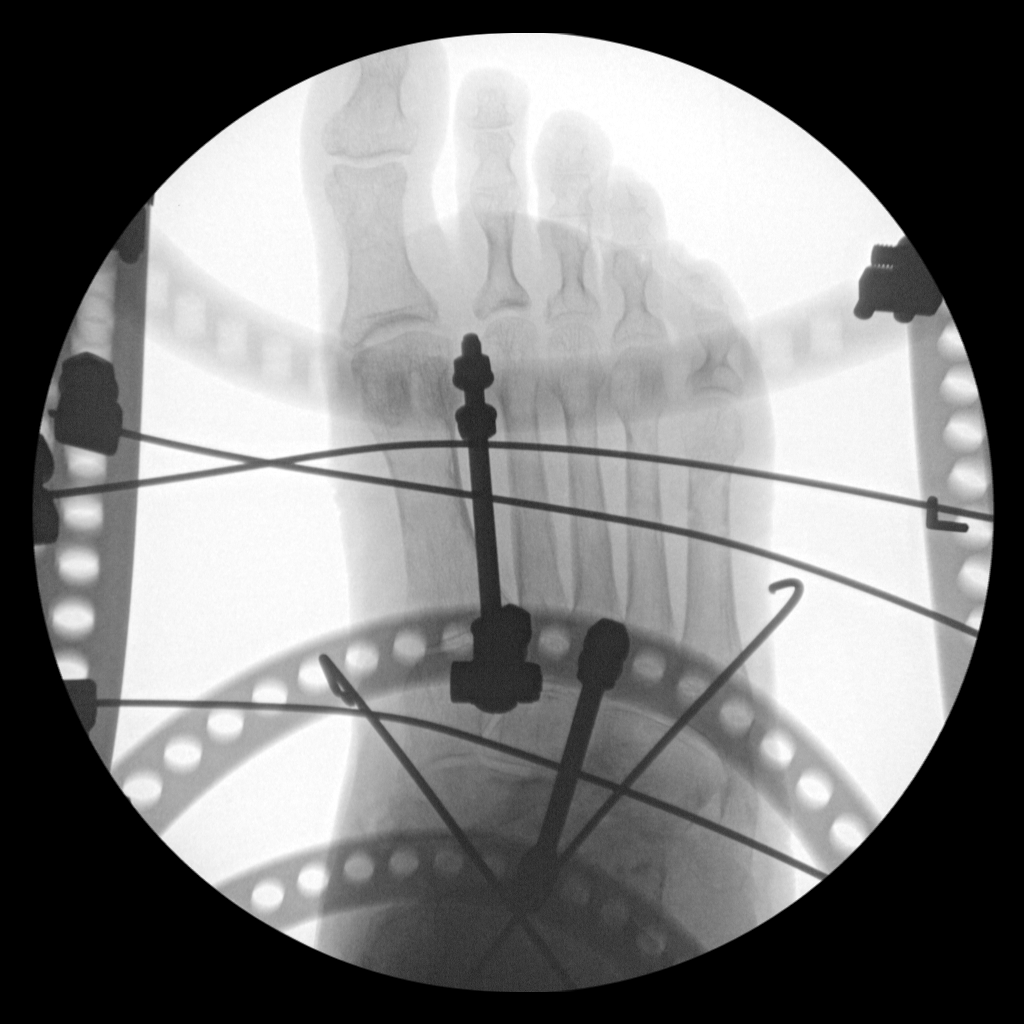
[im 3/4]
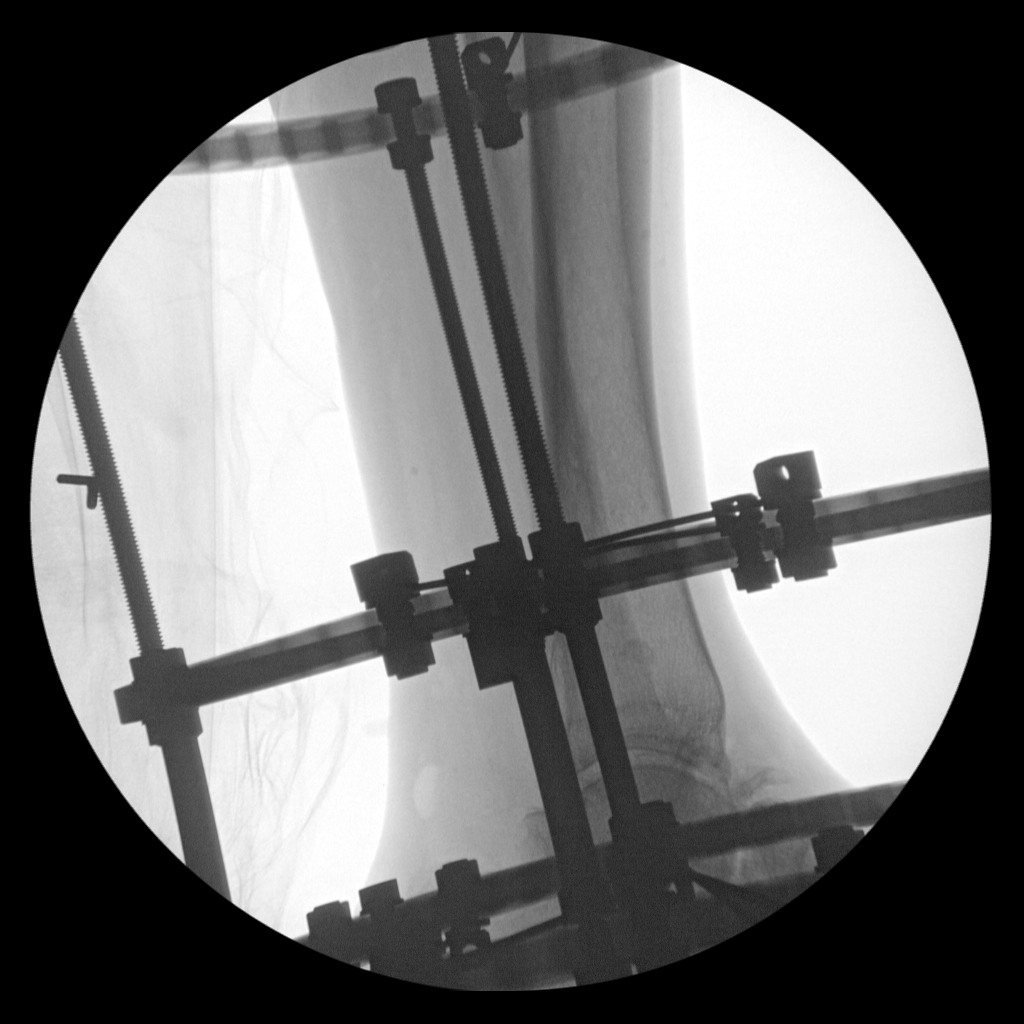
[im 4/4]
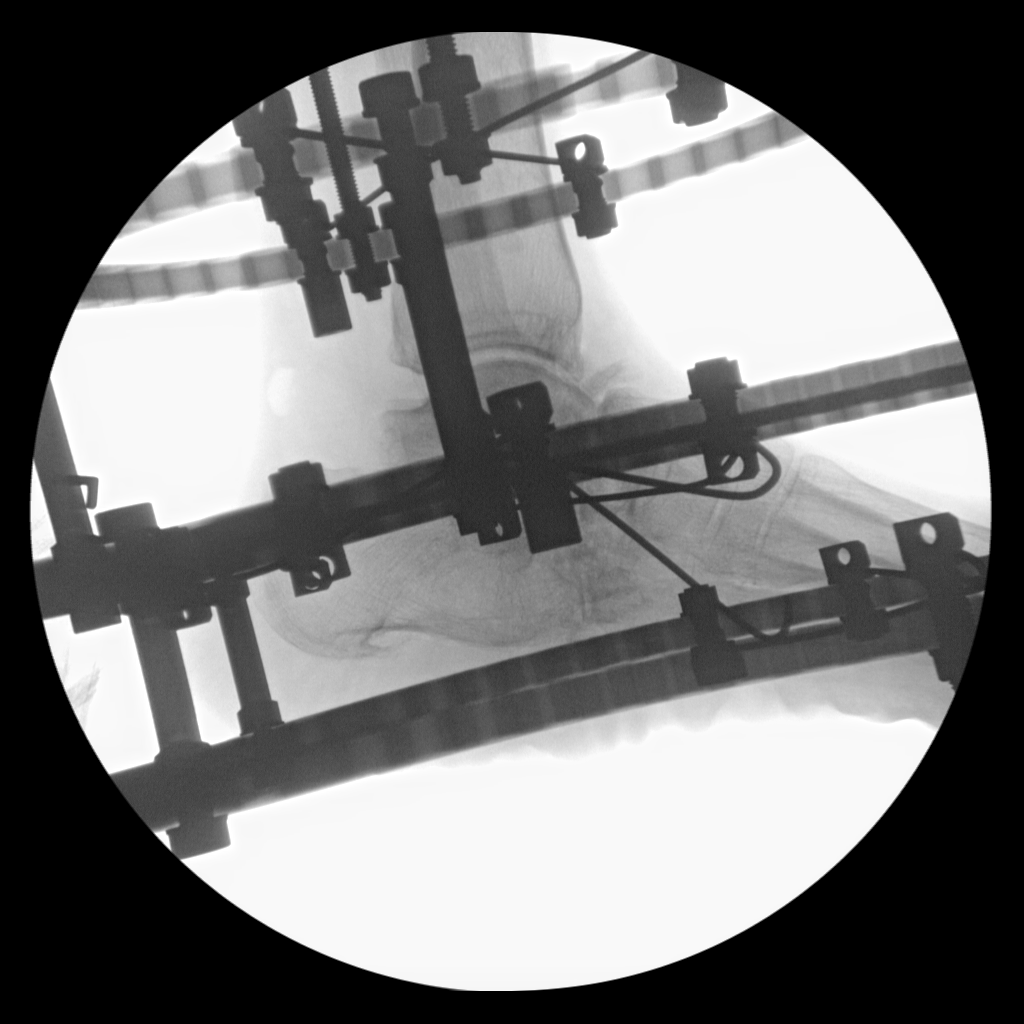

[4 of 4 positions shown; findings below may reference images not displayed]

FINDINGS: Anatomy is obscured by an external fixator. Smooth percutaneous pins
are noted in the region of the hindfoot.
IMPRESSION: As above.

## 2016-12-19 ENCOUNTER — Encounter: Payer: Self-pay | Admitting: *Deleted

## 2016-12-19 ENCOUNTER — Other Ambulatory Visit: Payer: Self-pay | Admitting: Internal Medicine

## 2016-12-19 DIAGNOSIS — G4733 Obstructive sleep apnea (adult) (pediatric): Secondary | ICD-10-CM

## 2016-12-29 ENCOUNTER — Encounter: Payer: Medicare Other | Admitting: Internal Medicine

## 2016-12-30 ENCOUNTER — Encounter: Payer: Medicare Other | Admitting: Internal Medicine

## 2017-01-06 ENCOUNTER — Encounter: Payer: Self-pay | Admitting: Internal Medicine

## 2017-01-06 ENCOUNTER — Ambulatory Visit (INDEPENDENT_AMBULATORY_CARE_PROVIDER_SITE_OTHER): Payer: Medicare Other | Admitting: Internal Medicine

## 2017-01-06 VITALS — BP 144/82 | HR 77 | Temp 98.1°F | Ht 69.0 in | Wt 277.5 lb

## 2017-01-06 DIAGNOSIS — H409 Unspecified glaucoma: Secondary | ICD-10-CM

## 2017-01-06 DIAGNOSIS — E669 Obesity, unspecified: Secondary | ICD-10-CM

## 2017-01-06 DIAGNOSIS — Z6841 Body Mass Index (BMI) 40.0 and over, adult: Secondary | ICD-10-CM

## 2017-01-06 DIAGNOSIS — G4733 Obstructive sleep apnea (adult) (pediatric): Secondary | ICD-10-CM | POA: Diagnosis not present

## 2017-01-06 DIAGNOSIS — E785 Hyperlipidemia, unspecified: Secondary | ICD-10-CM | POA: Diagnosis not present

## 2017-01-06 DIAGNOSIS — Z7984 Long term (current) use of oral hypoglycemic drugs: Secondary | ICD-10-CM

## 2017-01-06 DIAGNOSIS — Z79899 Other long term (current) drug therapy: Secondary | ICD-10-CM

## 2017-01-06 DIAGNOSIS — I2510 Atherosclerotic heart disease of native coronary artery without angina pectoris: Secondary | ICD-10-CM | POA: Diagnosis not present

## 2017-01-06 DIAGNOSIS — E119 Type 2 diabetes mellitus without complications: Secondary | ICD-10-CM | POA: Diagnosis not present

## 2017-01-06 DIAGNOSIS — Z0000 Encounter for general adult medical examination without abnormal findings: Secondary | ICD-10-CM

## 2017-01-06 DIAGNOSIS — Z87891 Personal history of nicotine dependence: Secondary | ICD-10-CM

## 2017-01-06 DIAGNOSIS — I10 Essential (primary) hypertension: Secondary | ICD-10-CM

## 2017-01-06 DIAGNOSIS — E1169 Type 2 diabetes mellitus with other specified complication: Secondary | ICD-10-CM

## 2017-01-06 DIAGNOSIS — Z23 Encounter for immunization: Secondary | ICD-10-CM

## 2017-01-06 LAB — GLUCOSE, CAPILLARY: GLUCOSE-CAPILLARY: 116 mg/dL — AB (ref 65–99)

## 2017-01-06 LAB — POCT GLYCOSYLATED HEMOGLOBIN (HGB A1C): HEMOGLOBIN A1C: 7.3

## 2017-01-06 MED ORDER — LISINOPRIL 10 MG PO TABS: 10.0000 mg | ORAL_TABLET | Freq: Every day | ORAL | 11 refills | Status: DC

## 2017-01-06 NOTE — Patient Instructions (Addendum)
FOLLOW-UP INSTRUCTIONS When: 3 months For: Blood pressure and diabetes management What to bring: Medications, glucose readings   [name],  It was a pleasure to see you today.  Your blood pressure was a little high today. Please start taking lisinopril 10 mg once a day.  You received a flu shot in clinic today.  I will see you back in 3 months.  Grains Whole grains, such as whole-wheat or whole-grain breads, crackers, cereals, and pasta. Unsweetened oatmeal. Bulgur. Barley. Quinoa. Brown rice. Corn or whole-wheat flour tortillas or taco shells. Vegetables Lettuce. Spinach. Peas. Beets. Cauliflower. Cabbage. Broccoli. Carrots. Tomatoes. Squash. Eggplant. Herbs. Peppers. Onions. Cucumbers. Brussels sprouts. Fruits Berries. Bananas. Apples. Oranges. Grapes. Papaya. Mango. Pomegranate. Kiwi. Grapefruit. Cherries. Meats and Other Protein Sources Seafood. Lean meats, such as chicken and [place] or lean cuts of pork and beef. Tofu. Eggs. Nuts. Beans. Dairy Low-fat or fat-free dairy products, such as yogurt, cottage cheese, and cheese. Beverages Water. Tea. Coffee. Sugar-free or diet soda. Seltzer water. Milk. Milk alternatives, such as soy or almond milk. Condiments Mustard. Relish. Low-fat, low-sugar ketchup. Low-fat, low-sugar barbecue sauce. Low-fat or fat-free mayonnaise. Sweets and Desserts Sugar-free or low-fat pudding. Sugar-free or low-fat ice cream and other frozen treats. Fats and Oils Avocado. Walnuts. Olive oil. The items listed above may not be a complete list of recommended foods or beverages. Contact your dietitian for more options. What foods are not recommended? Grains Refined white flour and flour products, such as bread, pasta, snack foods, and cereals. Beverages Sweetened drinks, such as sweet iced tea and soda. Sweets and Desserts Baked goods, such as cake, cupcakes, pastries, cookies, and cheesecake. The items listed above may not be a complete list of foods  and beverages to avoid. Contact your dietitian for more information. This information is not intended to replace advice given to you by your health care provider. Make sure you discuss any questions you have with your health care provider. Document Released: 05/20/2014 Document Revised: 06/11/2015 Document Reviewed: 01/29/2014 Elsevier Interactive Patient Education  2017 [employer].

## 2017-01-06 NOTE — Assessment & Plan Note (Signed)
Assessment BP today was 148/74, repeat was 144/82. On HCTZ 12.5 mg daily and lisinopril 5 mg daily. Denies headache, chest pain, cough or shortness of breath.  Plan - Increase lisinopril to 10 mg daily. - Continue HCTZ 12.5 mg daily - Check BMET in 3 months. - Follow up in 3 months

## 2017-01-06 NOTE — Assessment & Plan Note (Signed)
Receive the flu shot today.

## 2017-01-06 NOTE — Progress Notes (Signed)
   CC: Management of blood pressure and diabetes  HPI:  [name] is a 61 y.o. with PMH significant for diabetes, CAD, hypertension, hyperlipidemia, and OSA who presents for management of blood pressure and diabetes.  Please see the assessment and plan below for the status of the patient's chronic medical problems.  Past Medical History:  Diagnosis Date  . Chronic normocytic anemia   . CKD (chronic kidney disease) stage 3, GFR 30-59 ml/min (HCC) 02/02/2013  . Diabetes mellitus type II, controlled (HCC) 06/17/2009  . Essential hypertension 06/17/2009  . GERD (gastroesophageal reflux disease)   . Glaucoma   . H/O hiatal hernia   . Hyperlipidemia   . MRSA (methicillin resistant Staphylococcus aureus)   . Obstructive sleep apnea 05/26/2010   Review of Systems:   Constitutional: Negative for diaphoresis, fever, malaise/fatigue, and weight loss. HEENT: Negative for blurred vision, hearing loss, congestion, and sore throat. Respiratory: Negative for cough, shortness of breath and wheezing. Cardiovascular: Negative for chest pain, palpitations, orthopnea, PND, and leg swelling. Gastrointestinal: Negative for abdominal pain, blood in stool, constipation, diarrhea, heartburn, nausea and vomiting. Genitourinary: Negative for dysuria and hematuria. Musculoskeletal: Negative for myalgias. Positive for occasional left leg pains, controlled with tylenol. Skin: Positive for occasional cold feeling in fingers and toes. Negative for Raynaud's Neurological: Negative for dizziness, focal weakness, weakness and headaches.  Physical Exam:  Vitals:   01/06/17 1519  BP: (!) 148/74  Pulse: 77  Temp: 98.1 F (36.7 C)  TempSrc: Oral  SpO2: 100%  Weight: 277 lb 8 oz (125.9 kg)  Height: 5\' 9"  (1.753 m)   GEN: Well-appearing, obese female sitting in NAD. Alert and oriented. HENT: [place]/AT. Moist mucous membranes. No visible lesions. EYES: PERRL. Sclera non-icteric. Conjunctiva clear. NECK: No  cervical LAD. RESP: Clear to auscultation bilaterally. No wheezes, rales, or rhonchi. No increased work of breathing. CV: Normal rate and regular rhythm. No murmurs, gallops, or rubs. No LE edema. ABD: Soft. Non-tender. Non-distended. Normoactive bowel sounds. EXT: No edema. Warm and well perfused. Boot on RLE. NEURO: Cranial nerves II-XII grossly intact. Able to lift all four extremities against gravity. No apparent audiovisual hallucinations. Speech fluent and appropriate. PSYCH: Patient is calm and pleasant. Appropriate affect. Well-groomed; speech is appropriate and on-subject.   Assessment & Plan:   See Encounters Tab for problem based charting.  Patient seen with Dr. 

## 2017-01-06 NOTE — Assessment & Plan Note (Addendum)
Assessment Glucometer with average of 149. A1c 7.3. On metformin 500 mg twice a day and sitagliptin 50 mg daily. She does try to watch and adjust her diet. She tries to eat earlier at night. She has noticed that if she eats a piece of fruit at night, her blood sugar will be higher the next morning. She also has been going to the aquatic center twice a week to do exercise, which she states going well. Denies hypoglycemic episodes. Most recent creatinine 1.6. She endorses mild aches in her left leg that worsen when she is on her feet for a long time. She states that Tylenol is helpful. She also endorses a feeling of coldness in her hands and feet occasionally. No Raynaud's.  Plan - Continue metformin 500 mg twice a day and sitagliptin 50 mg daily. - BMET at next visit - Provided information regarding good dietary choices for diabetes

## 2017-01-06 NOTE — Progress Notes (Signed)
Internal Medicine Clinic Attending  I saw and evaluated the patient.  I personally confirmed the key portions of the history and exam documented by Dr. Huang and I reviewed pertinent patient test results.  The assessment, diagnosis, and plan were formulated together and I agree with the documentation in the resident's note.  

## 2017-01-08 ENCOUNTER — Other Ambulatory Visit: Payer: Self-pay | Admitting: Internal Medicine

## 2017-01-08 DIAGNOSIS — K219 Gastro-esophageal reflux disease without esophagitis: Secondary | ICD-10-CM

## 2017-02-09 ENCOUNTER — Other Ambulatory Visit: Payer: Self-pay | Admitting: Family Medicine

## 2017-02-09 DIAGNOSIS — Z139 Encounter for screening, unspecified: Secondary | ICD-10-CM

## 2017-02-27 DIAGNOSIS — M14672 Charcot's joint, left ankle and foot: Secondary | ICD-10-CM | POA: Diagnosis not present

## 2017-02-27 DIAGNOSIS — Z4789 Encounter for other orthopedic aftercare: Secondary | ICD-10-CM | POA: Diagnosis not present

## 2017-02-27 DIAGNOSIS — M14679 Charcot's joint, unspecified ankle and foot: Secondary | ICD-10-CM | POA: Diagnosis not present

## 2017-03-03 NOTE — Addendum Note (Signed)
Addended by: Pearletha Alfred on: 03/03/2017 02:02 PM   Modules accepted: Orders

## 2017-03-20 ENCOUNTER — Ambulatory Visit
Admission: RE | Admit: 2017-03-20 | Discharge: 2017-03-20 | Disposition: A | Discharge status: Home / Self Care | Payer: Medicare Other | Source: Ambulatory Visit | Attending: Family Medicine | Admitting: Family Medicine

## 2017-03-20 DIAGNOSIS — Z139 Encounter for screening, unspecified: Secondary | ICD-10-CM

## 2017-03-22 ENCOUNTER — Ambulatory Visit (HOSPITAL_BASED_OUTPATIENT_CLINIC_OR_DEPARTMENT_OTHER): Payer: Medicare Other | Attending: Internal Medicine | Admitting: Internal Medicine

## 2017-03-22 VITALS — Ht 69.5 in | Wt 273.0 lb

## 2017-03-22 DIAGNOSIS — R0683 Snoring: Secondary | ICD-10-CM | POA: Insufficient documentation

## 2017-03-22 DIAGNOSIS — Z79899 Other long term (current) drug therapy: Secondary | ICD-10-CM | POA: Diagnosis not present

## 2017-03-22 DIAGNOSIS — G4733 Obstructive sleep apnea (adult) (pediatric): Secondary | ICD-10-CM | POA: Insufficient documentation

## 2017-04-03 DIAGNOSIS — G4733 Obstructive sleep apnea (adult) (pediatric): Secondary | ICD-10-CM | POA: Diagnosis not present

## 2017-04-03 NOTE — Procedures (Signed)
    Patient Name: Sarah, [name] Date: 03/22/2017 Gender: Female D.O.B: [birth year] Age (years): 48 Referring Provider: Alvira Philips DO Height (inches): 69 Interpreting Physician: Jetty Duhamel MD, ABSM Weight (lbs): 273 RPSGT: Ulyess Mort BMI: 40 MRN: [id number] Neck Size: 15.00 <br> <br> CLINICAL INFORMATION The patient is referred for a BiPAP titration to treat sleep apnea.  Date of NPSG, Split Night or HST:  NPSG 09/11/16   AHI 13.8/ hr, desaturation to 86%, body weight 275 lbs  SLEEP STUDY TECHNIQUE As per the AASM Manual for the Scoring of Sleep and Associated Events v2.3 (April 2016) with a hypopnea requiring 4% desaturations.  The channels recorded and monitored were frontal, central and occipital EEG, electrooculogram (EOG), submentalis EMG (chin), nasal and oral airflow, thoracic and abdominal wall motion, anterior tibialis EMG, snore microphone, electrocardiogram, and pulse oximetry. Bilevel positive airway pressure (BPAP) was initiated at the beginning of the study and titrated to treat sleep-disordered breathing.  MEDICATIONS Medications self-administered by patient taken the night of the study : LUMIGAN  RESPIRATORY PARAMETERS Optimal IPAP Pressure (cm): 24 AHI at Optimal Pressure (/hr) 0.0 Optimal EPAP Pressure (cm): 20   Overall Minimal O2 (%): 90.0 Minimal O2 at Optimal Pressure (%): 95.0 SLEEP ARCHITECTURE Start Time: 11:03:51 PM Stop Time: 5:09:20 AM Total Time (min): 365.5 Total Sleep Time (min): 332.5 Sleep Latency (min): 18.8 Sleep Efficiency (%): 91.0% REM Latency (min): 89.5 WASO (min): 14.2 Stage N1 (%): 5.9% Stage N2 (%): 78.5% Stage N3 (%): 0.2% Stage R (%): 15.49 Supine (%): 88.42 Arousal Index (/hr): 13.5   CARDIAC DATA The 2 lead EKG demonstrated sinus rhythm. The mean heart rate was 65.5 beats per minute. Other EKG findings include: None.  LEG MOVEMENT DATA The total Periodic Limb Movements of Sleep (PLMS) were 0. The PLMS index was  0.0. A PLMS index of <15 is considered normal in adults.  IMPRESSIONS - CPAP did not provide adequate control at tolerated pressures and was converted to BIPAP titration. - An optimal BIPAP pressure was selected for this patient ( 24 / 10 cm of water) - Mild Central Sleep Apnea was noted during this titration (CAI = 9.9/h). - Significant oxygen desaturations were not observed during this titration (min O2 = 90.0%). - The patient snored with soft snoring volume. - No cardiac abnormalities were observed during this study. - Clinically significant periodic limb movements were not noted during this study. Arousals associated with PLMs were rare.  DIAGNOSIS - Obstructive Sleep Apnea (327.23 [G47.33 ICD-10])  RECOMMENDATIONS - Trial of BiPAP therapy on 24/10 cm H2O with a Small size Resmed Full Face Mask AirFit F20 mask and heated humidification. - Be careful with alcohol, sedatives and other CNS depressants that may worsen sleep apnea and disrupt normal sleep architecture. - Sleep hygiene should be reviewed to assess factors that may improve sleep quality. - Weight management and regular exercise should be initiated or continued.  [Electronically signed] 04/03/2017 07:43 AM  Jetty Duhamel MD, ABSM Diplomate, American Board of Sleep Medicine   NPI: [id number]                        Jetty Duhamel Diplomate, American Board of Sleep Medicine  ELECTRONICALLY SIGNED ON:  04/03/2017, 7:45 AM [facility] PH: [phone]   FX: [phone] ACCREDITED BY THE AMERICAN ACADEMY OF SLEEP MEDICINE

## 2017-04-18 DIAGNOSIS — H401132 Primary open-angle glaucoma, bilateral, moderate stage: Secondary | ICD-10-CM | POA: Diagnosis not present

## 2017-05-18 ENCOUNTER — Encounter: Payer: Self-pay | Admitting: Internal Medicine

## 2017-05-18 ENCOUNTER — Other Ambulatory Visit: Payer: Self-pay

## 2017-05-18 ENCOUNTER — Ambulatory Visit (INDEPENDENT_AMBULATORY_CARE_PROVIDER_SITE_OTHER): Payer: Medicare Other | Admitting: Internal Medicine

## 2017-05-18 VITALS — BP 105/60 | HR 68 | Temp 97.8°F | Ht 69.0 in | Wt 262.3 lb

## 2017-05-18 DIAGNOSIS — E1169 Type 2 diabetes mellitus with other specified complication: Secondary | ICD-10-CM | POA: Diagnosis not present

## 2017-05-18 DIAGNOSIS — I10 Essential (primary) hypertension: Secondary | ICD-10-CM

## 2017-05-18 DIAGNOSIS — Z79899 Other long term (current) drug therapy: Secondary | ICD-10-CM

## 2017-05-18 DIAGNOSIS — Z7984 Long term (current) use of oral hypoglycemic drugs: Secondary | ICD-10-CM | POA: Diagnosis not present

## 2017-05-18 DIAGNOSIS — N183 Chronic kidney disease, stage 3 unspecified: Secondary | ICD-10-CM

## 2017-05-18 DIAGNOSIS — E669 Obesity, unspecified: Secondary | ICD-10-CM | POA: Insufficient documentation

## 2017-05-18 DIAGNOSIS — Z1211 Encounter for screening for malignant neoplasm of colon: Secondary | ICD-10-CM

## 2017-05-18 DIAGNOSIS — I129 Hypertensive chronic kidney disease with stage 1 through stage 4 chronic kidney disease, or unspecified chronic kidney disease: Secondary | ICD-10-CM

## 2017-05-18 DIAGNOSIS — E785 Hyperlipidemia, unspecified: Secondary | ICD-10-CM | POA: Diagnosis not present

## 2017-05-18 DIAGNOSIS — Z6838 Body mass index (BMI) 38.0-38.9, adult: Secondary | ICD-10-CM | POA: Diagnosis not present

## 2017-05-18 DIAGNOSIS — I2510 Atherosclerotic heart disease of native coronary artery without angina pectoris: Secondary | ICD-10-CM | POA: Diagnosis not present

## 2017-05-18 DIAGNOSIS — K219 Gastro-esophageal reflux disease without esophagitis: Secondary | ICD-10-CM

## 2017-05-18 DIAGNOSIS — G4733 Obstructive sleep apnea (adult) (pediatric): Secondary | ICD-10-CM | POA: Diagnosis not present

## 2017-05-18 DIAGNOSIS — E1122 Type 2 diabetes mellitus with diabetic chronic kidney disease: Secondary | ICD-10-CM

## 2017-05-18 DIAGNOSIS — Z0000 Encounter for general adult medical examination without abnormal findings: Secondary | ICD-10-CM

## 2017-05-18 DIAGNOSIS — E66811 Obesity, class 1: Secondary | ICD-10-CM | POA: Insufficient documentation

## 2017-05-18 LAB — GLUCOSE, CAPILLARY: Glucose-Capillary: 125 mg/dL — ABNORMAL HIGH (ref 65–99)

## 2017-05-18 LAB — POCT GLYCOSYLATED HEMOGLOBIN (HGB A1C): Hemoglobin A1C: 6.7

## 2017-05-18 MED ORDER — LISINOPRIL 10 MG PO TABS: 10.0000 mg | ORAL_TABLET | Freq: Every day | ORAL | 1 refills | Status: DC

## 2017-05-18 MED ORDER — FERROUS SULFATE 325 (65 FE) MG PO TABS: 325.0000 mg | ORAL_TABLET | Freq: Every day | ORAL | 1 refills | Status: DC

## 2017-05-18 MED ORDER — SITAGLIPTIN PHOSPHATE 50 MG PO TABS: 50.0000 mg | ORAL_TABLET | Freq: Every day | ORAL | 3 refills | Status: DC

## 2017-05-18 MED ORDER — ATORVASTATIN CALCIUM 40 MG PO TABS
40.0000 mg | ORAL_TABLET | Freq: Every day | ORAL | 3 refills | Status: DC
Start: 2017-05-18 — End: 2017-10-11

## 2017-05-18 MED ORDER — PANTOPRAZOLE SODIUM 20 MG PO TBEC: DELAYED_RELEASE_TABLET | ORAL | 0 refills | Status: DC

## 2017-05-18 MED ORDER — METFORMIN HCL 500 MG PO TABS: 500.0000 mg | ORAL_TABLET | Freq: Two times a day (BID) | ORAL | 3 refills | Status: DC

## 2017-05-18 NOTE — Patient Instructions (Addendum)
FOLLOW-UP INSTRUCTIONS When: 3 months For: BP and diabetes management What to bring: medications, glucometer   [name],  It was good to see you again today.  Great job on losing 15 pounds! You are doing a wonderful job of watching your diet and it is paying off. Your blood pressure and diabetes are both better because of the weight you're losing. Because your blood pressure is better, we will be stopping the HCTZ today. Continue to take your other medicines, including lisinopril, as you already are.  We are going to get you in to get a colonoscopy with Dr. Dulce Sellar. Please continue seeing the eye doctor.  We are working on getting you the BiPAP to help you sleep better.  Please come back in about 3 months.

## 2017-05-18 NOTE — Assessment & Plan Note (Addendum)
Assessment Stable. Last Cr 1.61, eGFR 40 in 08/2016. On lisinopril.   Plan - Check BMP at next visit

## 2017-05-18 NOTE — Assessment & Plan Note (Addendum)
Colon cancer screening Colonoscopy in 2011 with Dr. Dulce Sellar was incomplete due to poor prep. FIT card testing done in 02/2016 was negative. She is interested in getting a colonoscopy. - Referral to GI for screening colonoscopy  Diabetic retinopathy screening Patient reports her last eye exam was 03/2017. States that she has a follow up appointment with them in a few months.

## 2017-05-18 NOTE — Assessment & Plan Note (Addendum)
Assessment A1c improved to 6.7 today from 7.3 in 12/2016. I suspect this improvement is from the 15 lb weight loss that she has been able to achieve over the last 6 months. She reports compliance with her medications.  Plan - Continue metformin 500mg  BID and sitagliptin 50mg  daily - Could consider switching to canagliflozin for its renal and cardiac benefits - F/u in 3 months

## 2017-05-18 NOTE — Assessment & Plan Note (Signed)
Stable - Continue atorvastatin 40 daily

## 2017-05-18 NOTE — Assessment & Plan Note (Signed)
Stable.  Continue PPI. 

## 2017-05-18 NOTE — Assessment & Plan Note (Addendum)
Assessment BP well-controlled at 105/60. Improved control is likely a result of patient's 15lb weight loss in the last 6 months. Denies CP, SOB, or HA. Does endorse some "unsteadiness" / lightheadedness when standing occasionally. May be from improved BP control. Will keep lisinopril for renal protective effect with hx of diabetes, d/c HCTZ and continue to monitor.  Plan - Continue lisinopril 10mg  daily - Discontinue HCTZ - F/u in 3 months

## 2017-05-18 NOTE — Assessment & Plan Note (Addendum)
Assessment Sleep study in 03/2017 recommended trial of BiPAP 24/10.  Plan - Order placed for BiPAP

## 2017-05-18 NOTE — Progress Notes (Signed)
   CC: management of HTN  HPI:  [name] is a 62 y.o. female with PMH of untreated OSA, CAD, HTN, HLD, DM, and CKD who presents for management of HTN.  She denies HA, chest pain, SOB, or dysuria. She does endorse occasional transient unsteadiness/lightheadedness when she stands up.   She also asks about bone health and what she can do to improve it. She is already taking calcium/vitamin D. Mother had a hip fracture, no other risk factors. Based on FRAX scores, 10 year probability of hip fracture is 0.5% and 10-year probability of major osteoporotic fracture is 11%, both below the threshold for screening a female below age of 36.  Please see the assessment and plan below for the status of the patient's chronic medical problems.  Past Medical History:  Diagnosis Date  . Chronic normocytic anemia   . CKD (chronic kidney disease) stage 3, GFR 30-59 ml/min (HCC) 02/02/2013  . Diabetes mellitus type II, controlled (HCC) 06/17/2009  . Essential hypertension 06/17/2009  . GERD (gastroesophageal reflux disease)   . Glaucoma   . H/O hiatal hernia   . Hyperlipidemia   . MRSA (methicillin resistant Staphylococcus aureus)   . Obstructive sleep apnea 05/26/2010   Review of Systems:  GEN: Negative for fevers NEURO: Negative for HA, positive for intermittent transient unsteadiness/lightheadedness with standing CV: Negative for chest pain  PULM: Negative for cough or SOB EXT: Negative for LE swelling  Physical Exam:  Vitals:   05/18/17 1421  BP: 105/60  Pulse: 68  Temp: 97.8 F (36.6 C)  TempSrc: Oral  SpO2: 100%  Weight: 262 lb 4.8 oz (119 kg)  Height: 5\' 9"  (1.753 m)   GEN: Sitting comfortably in chair in NAD CV: NR & RR, no m/r/g PULM: CTAB, no wheezes or rales ABD: Obese, Soft, NT, ND, +BS EXT: No LE edema  Assessment & Plan:   See Encounters Tab for problem based charting.  Patient discussed with Dr. 

## 2017-05-18 NOTE — Assessment & Plan Note (Addendum)
Assessment She has had intentional 15 pound weight loss in the last 6 months through decreasing the amount of sweets and calorie intake. Her weight loss has helped decrease her BP and her A1c. I congratulated her on her progress and continue to encourage appropriate dietary and exercise goals.  Plan - Encouraged continued dietary modifications

## 2017-05-22 NOTE — Progress Notes (Signed)
Internal Medicine Clinic Attending  Case discussed with Dr. Huang  at the time of the visit.  We reviewed the resident's history and exam and pertinent patient test results.  I agree with the assessment, diagnosis, and plan of care documented in the resident's note.  Alexander N Raines, MD   

## 2017-07-07 DIAGNOSIS — Z1211 Encounter for screening for malignant neoplasm of colon: Secondary | ICD-10-CM | POA: Diagnosis not present

## 2017-07-09 ENCOUNTER — Encounter: Payer: Self-pay | Admitting: *Deleted

## 2017-07-23 ENCOUNTER — Other Ambulatory Visit: Payer: Self-pay | Admitting: Internal Medicine

## 2017-07-23 DIAGNOSIS — I10 Essential (primary) hypertension: Secondary | ICD-10-CM

## 2017-08-22 DIAGNOSIS — H401132 Primary open-angle glaucoma, bilateral, moderate stage: Secondary | ICD-10-CM | POA: Diagnosis not present

## 2017-10-11 ENCOUNTER — Other Ambulatory Visit: Payer: Self-pay

## 2017-10-11 ENCOUNTER — Ambulatory Visit (INDEPENDENT_AMBULATORY_CARE_PROVIDER_SITE_OTHER): Payer: Medicare Other | Admitting: Internal Medicine

## 2017-10-11 DIAGNOSIS — N183 Chronic kidney disease, stage 3 unspecified: Secondary | ICD-10-CM

## 2017-10-11 DIAGNOSIS — I10 Essential (primary) hypertension: Secondary | ICD-10-CM | POA: Diagnosis not present

## 2017-10-11 DIAGNOSIS — Z6838 Body mass index (BMI) 38.0-38.9, adult: Secondary | ICD-10-CM | POA: Diagnosis not present

## 2017-10-11 DIAGNOSIS — Z9989 Dependence on other enabling machines and devices: Secondary | ICD-10-CM

## 2017-10-11 DIAGNOSIS — G4733 Obstructive sleep apnea (adult) (pediatric): Secondary | ICD-10-CM | POA: Diagnosis not present

## 2017-10-11 DIAGNOSIS — E1122 Type 2 diabetes mellitus with diabetic chronic kidney disease: Secondary | ICD-10-CM | POA: Diagnosis not present

## 2017-10-11 DIAGNOSIS — K219 Gastro-esophageal reflux disease without esophagitis: Secondary | ICD-10-CM

## 2017-10-11 DIAGNOSIS — Z79899 Other long term (current) drug therapy: Secondary | ICD-10-CM

## 2017-10-11 DIAGNOSIS — Z23 Encounter for immunization: Secondary | ICD-10-CM

## 2017-10-11 DIAGNOSIS — E669 Obesity, unspecified: Secondary | ICD-10-CM

## 2017-10-11 DIAGNOSIS — Z7984 Long term (current) use of oral hypoglycemic drugs: Secondary | ICD-10-CM | POA: Diagnosis not present

## 2017-10-11 DIAGNOSIS — I129 Hypertensive chronic kidney disease with stage 1 through stage 4 chronic kidney disease, or unspecified chronic kidney disease: Secondary | ICD-10-CM | POA: Diagnosis not present

## 2017-10-11 DIAGNOSIS — E1169 Type 2 diabetes mellitus with other specified complication: Secondary | ICD-10-CM

## 2017-10-11 DIAGNOSIS — Z87891 Personal history of nicotine dependence: Secondary | ICD-10-CM | POA: Diagnosis not present

## 2017-10-11 MED ORDER — ASPIRIN EC 81 MG PO TBEC: 81.0000 mg | DELAYED_RELEASE_TABLET | Freq: Every day | ORAL | 3 refills | Status: DC

## 2017-10-11 MED ORDER — PANTOPRAZOLE SODIUM 20 MG PO TBEC: DELAYED_RELEASE_TABLET | ORAL | 0 refills | Status: DC

## 2017-10-11 MED ORDER — FERROUS SULFATE 325 (65 FE) MG PO TABS: 325.0000 mg | ORAL_TABLET | Freq: Every day | ORAL | 1 refills | Status: DC

## 2017-10-11 MED ORDER — METFORMIN HCL 500 MG PO TABS: 500.0000 mg | ORAL_TABLET | Freq: Two times a day (BID) | ORAL | 3 refills | Status: DC

## 2017-10-11 MED ORDER — LISINOPRIL 10 MG PO TABS: 10.0000 mg | ORAL_TABLET | Freq: Every day | ORAL | 3 refills | Status: DC

## 2017-10-11 MED ORDER — ATORVASTATIN CALCIUM 40 MG PO TABS: 40.0000 mg | ORAL_TABLET | Freq: Every day | ORAL | 3 refills | Status: DC

## 2017-10-11 MED ORDER — SITAGLIPTIN PHOSPHATE 50 MG PO TABS: 50.0000 mg | ORAL_TABLET | Freq: Every day | ORAL | 3 refills | Status: DC

## 2017-10-11 NOTE — Assessment & Plan Note (Signed)
Refilled PPI 

## 2017-10-11 NOTE — Assessment & Plan Note (Signed)
Well controlled, last A1c four months ago was 6.7.  -- Refilled metformin & januvia  -- Repeat A1c 2 months (6 months from last check) -- Checking BMP

## 2017-10-11 NOTE — Assessment & Plan Note (Addendum)
Well controlled, 128/60.  -- Refilled lisinopril 10 mg daily -- Continue BiPAP for OSA -- Follow up BMP  ADDENDUM: Stable CKD. Called patient with results.

## 2017-10-11 NOTE — Assessment & Plan Note (Addendum)
Baseline Scr ~ 1.5 with GFR 40. Repeating labs today.   ADDENDUM: Stable labs

## 2017-10-11 NOTE — Patient Instructions (Addendum)
Ms. Windhorst,  It was a pleasure to see you today! I am glad to hear you are doing well with your CPAP machine. I have sent refills of your medications to your pharmacy. Please follow up with your primary care doctor in 3 months. If you have any questions or concerns, call our clinic at [phone] or after hours call [phone] and ask for the internal medicine resident on call. Thank you!  Dr. Antony Contras

## 2017-10-11 NOTE — Progress Notes (Signed)
   CC: OSA follow up  HPI:  [name] is a 62 y.o. female with past medical history outlined below here for OSA follow up. For the details of today's visit, please refer to the assessment and plan.  Past Medical History:  Diagnosis Date  . Chronic normocytic anemia   . CKD (chronic kidney disease) stage 3, GFR 30-59 ml/min (HCC) 02/02/2013  . Diabetes mellitus type II, controlled (HCC) 06/17/2009  . Essential hypertension 06/17/2009  . GERD (gastroesophageal reflux disease)   . Glaucoma   . H/O hiatal hernia   . Hyperlipidemia   . MRSA (methicillin resistant Staphylococcus aureus)   . Obstructive sleep apnea 05/26/2010    Review of Systems  Respiratory: Negative for shortness of breath.   Cardiovascular: Negative for chest pain.     Physical Exam:  Vitals:   10/11/17 1024  BP: 128/60  Pulse: 60  Temp: 97.9 F (36.6 C)  TempSrc: Oral  SpO2: 100%  Weight: 260 lb (117.9 kg)    Constitutional: NAD, appears comfortable.  Cardiovascular: RRR, no murmurs, rubs, or gallops.  Pulmonary/Chest: CTAB, no wheezes, rales, or rhonchi.  Extremities: Warm and well perfused. No edema. Psychiatric: Normal mood and affect  Assessment & Plan:   See Encounters Tab for problem based charting.  Patient discussed with Dr. Cleda Daub

## 2017-10-11 NOTE — Assessment & Plan Note (Signed)
Patient is here for BiPAP follow up. She has a history of OSA. Had a repeat sleep study done in March of 2019 and has been on her new BiPAP machine now for 1 month. She reports significant improvement in her energy level and decrease in her night time awakenings. She wears her machine every night for 5-7 hours.  -- Continue current management

## 2017-10-11 NOTE — Assessment & Plan Note (Signed)
Patient has been actively dieting and exercising. She is down 2 lbs since her last visit and 16 lbs in the last year. I encouraged her to keep up the good work. Today we talked about her goals, which is to lose another 30 lbs by the end of the year. I discussed with her that 30 lbs is a significant amount of weight to lose in 3 months, and advised a more realistic goal of 2-3 lbs a month. Encouraged her that 30 lbs is a great long term goal.  -- Follow up 3 months

## 2017-10-12 LAB — BMP8+ANION GAP
ANION GAP: 11 mmol/L (ref 10.0–18.0)
BUN/Creatinine Ratio: 18 (ref 12–28)
BUN: 27 mg/dL (ref 8–27)
CHLORIDE: 104 mmol/L (ref 96–106)
CO2: 25 mmol/L (ref 20–29)
Calcium: 10.1 mg/dL (ref 8.7–10.3)
Creatinine, Ser: 1.54 mg/dL — ABNORMAL HIGH (ref 0.57–1.00)
GFR calc Af Amer: 42 mL/min/{1.73_m2} — ABNORMAL LOW (ref >59)
GFR, EST NON AFRICAN AMERICAN: 36 mL/min/{1.73_m2} — AB (ref >59)
GLUCOSE: 138 mg/dL — AB (ref 65–99)
POTASSIUM: 5.1 mmol/L (ref 3.5–5.2)
SODIUM: 140 mmol/L (ref 134–144)

## 2017-10-16 NOTE — Progress Notes (Signed)
Internal Medicine Clinic Attending  Case discussed with Dr. Guilloud at the time of the visit.  We reviewed the resident's history and exam and pertinent patient test results.  I agree with the assessment, diagnosis, and plan of care documented in the resident's note.  

## 2017-10-19 ENCOUNTER — Other Ambulatory Visit: Payer: Self-pay | Admitting: Internal Medicine

## 2017-10-19 DIAGNOSIS — K219 Gastro-esophageal reflux disease without esophagitis: Secondary | ICD-10-CM

## 2017-11-06 ENCOUNTER — Encounter: Payer: Self-pay | Admitting: Podiatry

## 2017-11-06 ENCOUNTER — Other Ambulatory Visit: Payer: Self-pay | Admitting: Podiatry

## 2017-11-06 ENCOUNTER — Ambulatory Visit (INDEPENDENT_AMBULATORY_CARE_PROVIDER_SITE_OTHER): Payer: Medicare Other

## 2017-11-06 ENCOUNTER — Ambulatory Visit (INDEPENDENT_AMBULATORY_CARE_PROVIDER_SITE_OTHER): Payer: Medicare Other | Admitting: Podiatry

## 2017-11-06 ENCOUNTER — Ambulatory Visit: Payer: Medicare Other | Admitting: Podiatry

## 2017-11-06 DIAGNOSIS — M2142 Flat foot [pes planus] (acquired), left foot: Secondary | ICD-10-CM | POA: Diagnosis not present

## 2017-11-06 DIAGNOSIS — M14672 Charcot's joint, left ankle and foot: Secondary | ICD-10-CM

## 2017-11-06 DIAGNOSIS — M79672 Pain in left foot: Secondary | ICD-10-CM

## 2017-11-07 ENCOUNTER — Ambulatory Visit: Payer: Medicare Other | Admitting: Orthotics

## 2017-11-07 DIAGNOSIS — M2142 Flat foot [pes planus] (acquired), left foot: Secondary | ICD-10-CM

## 2017-11-07 DIAGNOSIS — L97421 Non-pressure chronic ulcer of left heel and midfoot limited to breakdown of skin: Secondary | ICD-10-CM

## 2017-11-07 DIAGNOSIS — E11610 Type 2 diabetes mellitus with diabetic neuropathic arthropathy: Secondary | ICD-10-CM

## 2017-11-07 DIAGNOSIS — E08621 Diabetes mellitus due to underlying condition with foot ulcer: Secondary | ICD-10-CM

## 2017-11-07 DIAGNOSIS — M14672 Charcot's joint, left ankle and foot: Secondary | ICD-10-CM

## 2017-11-07 DIAGNOSIS — E0842 Diabetes mellitus due to underlying condition with diabetic polyneuropathy: Secondary | ICD-10-CM

## 2017-11-07 NOTE — Progress Notes (Signed)

## 2017-11-12 NOTE — Progress Notes (Signed)
   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 She states her left foot is leaning in her shoes. She denies any pain but believes she may need an adjustment to the DM shoes. Patient is here for further evaluation and treatment.   Past Medical History:  Diagnosis Date  . Chronic normocytic anemia   . CKD (chronic kidney disease) stage 3, GFR 30-59 ml/min (HCC) 02/02/2013  . Diabetes mellitus type II, controlled (HCC) 06/17/2009  . Essential hypertension 06/17/2009  . GERD (gastroesophageal reflux disease)   . Glaucoma   . H/O hiatal hernia   . Hyperlipidemia   . MRSA (methicillin resistant Staphylococcus aureus)   . Obstructive sleep apnea 05/26/2010    Objective: Physical Exam General: The patient is alert and oriented x3 in no acute distress.  Dermatology: Skin is warm, dry and supple bilateral lower extremities. Negative for open lesions or macerations.  Vascular: Palpable pedal pulses bilaterally. No edema or erythema noted. Capillary refill within normal limits.  Neurological: Epicritic and protective threshold grossly intact bilaterally.   Musculoskeletal Exam: Range of motion within normal limits to all pedal and ankle joints bilateral. Muscle strength 5/5 in all groups bilateral.   Assessment: #1 DM with peripheral neuropathy #2 h/o Charcot LLE  Plan of Care:  #1 Patient was evaluated. #2 Appointment with [facility] for DM shoes and insoles.  #3 Handicap parking placard completed today.  #4 Return to clinic as needed.    Felecia Shelling, DPM [facility]  Dr. Felecia Shelling, DPM    [address]                                        [place], [place] [address]                Office [phone]  Fax [phone]

## 2017-12-28 DIAGNOSIS — H401132 Primary open-angle glaucoma, bilateral, moderate stage: Secondary | ICD-10-CM | POA: Diagnosis not present

## 2018-01-01 ENCOUNTER — Ambulatory Visit: Payer: Medicare Other | Admitting: Orthotics

## 2018-01-02 ENCOUNTER — Ambulatory Visit (INDEPENDENT_AMBULATORY_CARE_PROVIDER_SITE_OTHER): Payer: Medicare Other | Admitting: Orthotics

## 2018-01-02 DIAGNOSIS — M14672 Charcot's joint, left ankle and foot: Secondary | ICD-10-CM

## 2018-01-02 DIAGNOSIS — E0842 Diabetes mellitus due to underlying condition with diabetic polyneuropathy: Secondary | ICD-10-CM

## 2018-01-02 DIAGNOSIS — E11610 Type 2 diabetes mellitus with diabetic neuropathic arthropathy: Secondary | ICD-10-CM

## 2018-01-02 DIAGNOSIS — M2142 Flat foot [pes planus] (acquired), left foot: Secondary | ICD-10-CM | POA: Diagnosis not present

## 2018-01-04 ENCOUNTER — Telehealth: Payer: Self-pay | Admitting: Internal Medicine

## 2018-01-04 DIAGNOSIS — G4733 Obstructive sleep apnea (adult) (pediatric): Secondary | ICD-10-CM

## 2018-01-04 NOTE — Telephone Encounter (Signed)
I thought she just got a new machine in August? What supplies does she need?

## 2018-01-04 NOTE — Telephone Encounter (Signed)
Pt requesting New Supplies from [facility] for her [employer].  Please advise.

## 2018-01-05 NOTE — Telephone Encounter (Signed)
Yes she has.  It is time for her to get all of the supplies for Masks, tubing, filters and etc for the machine to remain current with her insurance to prove she is using her machine.

## 2018-01-08 NOTE — Progress Notes (Signed)

## 2018-01-11 ENCOUNTER — Other Ambulatory Visit: Payer: Self-pay | Admitting: Internal Medicine

## 2018-01-11 DIAGNOSIS — K219 Gastro-esophageal reflux disease without esophagitis: Secondary | ICD-10-CM

## 2018-01-15 NOTE — Telephone Encounter (Signed)
DME order for CPAP & supplies placed.

## 2018-01-17 ENCOUNTER — Other Ambulatory Visit: Payer: Self-pay | Admitting: Internal Medicine

## 2018-01-17 DIAGNOSIS — K219 Gastro-esophageal reflux disease without esophagitis: Secondary | ICD-10-CM

## 2018-01-19 NOTE — Telephone Encounter (Addendum)
Call made to [facility] regarding order for bipap supplies-spoke with [facility] Support Team (Compliance Dept)-no order needed-requested office notes from 10/11/17 (supporting cpap use) be faxed to [phone] to make sure pt is in compliance. Notes faxed.Criss Alvine, Darlene Cassady1/3/20205:04 PM

## 2018-01-25 ENCOUNTER — Encounter: Payer: Medicare Other | Admitting: Internal Medicine

## 2018-02-08 ENCOUNTER — Encounter: Payer: Self-pay | Admitting: Internal Medicine

## 2018-02-08 ENCOUNTER — Encounter (INDEPENDENT_AMBULATORY_CARE_PROVIDER_SITE_OTHER): Payer: Self-pay

## 2018-02-08 ENCOUNTER — Ambulatory Visit (INDEPENDENT_AMBULATORY_CARE_PROVIDER_SITE_OTHER): Payer: Medicare Other | Admitting: Internal Medicine

## 2018-02-08 ENCOUNTER — Other Ambulatory Visit: Payer: Self-pay

## 2018-02-08 VITALS — BP 133/65 | HR 76 | Temp 98.7°F | Ht 69.0 in | Wt 264.8 lb

## 2018-02-08 DIAGNOSIS — M25471 Effusion, right ankle: Secondary | ICD-10-CM | POA: Diagnosis not present

## 2018-02-08 DIAGNOSIS — E119 Type 2 diabetes mellitus without complications: Secondary | ICD-10-CM | POA: Diagnosis not present

## 2018-02-08 DIAGNOSIS — Z79899 Other long term (current) drug therapy: Secondary | ICD-10-CM

## 2018-02-08 DIAGNOSIS — I10 Essential (primary) hypertension: Secondary | ICD-10-CM

## 2018-02-08 DIAGNOSIS — R42 Dizziness and giddiness: Secondary | ICD-10-CM | POA: Diagnosis not present

## 2018-02-08 DIAGNOSIS — Z7984 Long term (current) use of oral hypoglycemic drugs: Secondary | ICD-10-CM

## 2018-02-08 DIAGNOSIS — R51 Headache: Secondary | ICD-10-CM | POA: Diagnosis not present

## 2018-02-08 DIAGNOSIS — E1169 Type 2 diabetes mellitus with other specified complication: Secondary | ICD-10-CM

## 2018-02-08 DIAGNOSIS — Z87891 Personal history of nicotine dependence: Secondary | ICD-10-CM | POA: Diagnosis not present

## 2018-02-08 LAB — POCT GLYCOSYLATED HEMOGLOBIN (HGB A1C): Hemoglobin A1C: 7.1 % — AB (ref 4.0–5.6)

## 2018-02-08 LAB — GLUCOSE, CAPILLARY: GLUCOSE-CAPILLARY: 84 mg/dL (ref 70–99)

## 2018-02-08 NOTE — Assessment & Plan Note (Addendum)
Patient reports right ankle swelling since 3 days ago. Has some discomfort due to swelling but no actual pain. Denies any trauma or fall.  On exam, Has right leg swelling mostly at her ankle and pitting edema up to her knee. ROM is intact. No tenderness. No evidence of underlying infection as has no redness no warmth. Also no ulcer or wound. Pulses are normal and sensation is at baseline. Left leg exam is normal and baseline. Moderate risk for DVT with Wells criteria. Will do duplex ultrasound. -Rt leg venous ultrasound

## 2018-02-08 NOTE — Assessment & Plan Note (Signed)
CBG 84 today, hemoglobin A1c 7.1.  Currently asymptomatic He is on metformin 500 mg daily and Januvia 50 daily. Reports dizziness and headache sometimes. But did not check her glucose at that time when she was symptomatic.  Mentions that the lowest CBG was at 80s (does not have her meter).  Recommended to check her blood sugar at home regularly particularly when having any dizziness or headache and bring meter at next visit. Gave hypoglycemia precausions. -Continue Metformin 500 mg daily and Januvia 50 daily (may consider replacing with combination pill in future) -Follow-up in 6 months or sooner as needed

## 2018-02-08 NOTE — Patient Instructions (Addendum)
Thank you for allowing [place] to provide your care today. Today we discussed your blood pressure, diabetes, and your ankle swelling. I ordered an ultrasound for your right leg to make sure there is no clot in your vessels.  We will call you if the results will be abnormal. Your blood pressure initially was elevated at 148/67, when we repeated that it improved to 133/65.  Discontinue your current blood pressure medication as before and will check your blood pressure next time and we will decide if we need to increase the dose. Your blood glucose today was at 84. As we talked, make sure to check your blood sugar at home,  particularly  if you experience dizziness or headache. (If your blood sugar was less than 70, drink some juice or eat something sweet) and return in clinic. Please bring your meter next visit.  Today I did not make any changes to your medications. Please follow-up in clinic as needed or in 3-6 months  Should you have any questions or concerns please call the internal medicine clinic at [phone].   Thank you

## 2018-02-08 NOTE — Progress Notes (Signed)
   CC: Right ankle swelling HPI:  [name] is a 63 y.o. female with past medical history listed below, came into the clinic today for right ankle swelling and follow-up of diabetes.  Please refer to problem based charting for further details and assessment and plan.  Past Medical History:  Diagnosis Date  . Chronic normocytic anemia   . CKD (chronic kidney disease) stage 3, GFR 30-59 ml/min (HCC) 02/02/2013  . Diabetes mellitus type II, controlled (HCC) 06/17/2009  . Essential hypertension 06/17/2009  . GERD (gastroesophageal reflux disease)   . Glaucoma   . H/O hiatal hernia   . Hyperlipidemia   . MRSA (methicillin resistant Staphylococcus aureus)   . Obstructive sleep apnea 05/26/2010   Review of Systems:  Review of Systems  Constitutional: Negative for fever.  Respiratory: Negative for shortness of breath.   Cardiovascular: Positive for leg swelling. Negative for chest pain.  Gastrointestinal: Negative for abdominal pain.  Musculoskeletal: Negative for falls and joint pain.  Neurological: Positive for dizziness and headaches. Negative for weakness.     Physical Exam:  Vitals:   02/08/18 1440  BP: (!) 148/67  Pulse: 82  Temp: 98.7 F (37.1 C)  TempSrc: Oral  SpO2: 99%  Weight: 264 lb 12.8 oz (120.1 kg)  Height: 5\' 9"  (1.753 m)   Physical Exam Constitutional:      Appearance: Normal appearance.  HENT:     Head: Normocephalic and atraumatic.  Eyes:     Extraocular Movements: Extraocular movements intact.  Cardiovascular:     Rate and Rhythm: Normal rate and regular rhythm.     Heart sounds: No murmur.  Pulmonary:     Effort: Pulmonary effort is normal.     Breath sounds: Normal breath sounds. No wheezing or rales.  Abdominal:     Palpations: Abdomen is soft.     Tenderness: There is no abdominal tenderness.  Musculoskeletal:        General: Swelling present. No tenderness or signs of injury.     Right lower leg: Edema present.     Left lower leg: No  edema.  Neurological:     General: No focal deficit present.     Mental Status: She is alert and oriented to person, place, and time.  Psychiatric:        Mood and Affect: Mood normal.        Behavior: Behavior normal.      Assessment & Plan:   See Encounters Tab for problem based charting.  Patient discussed with Dr. Heide Spark

## 2018-02-08 NOTE — Assessment & Plan Note (Signed)
The patient's blood pressure during this visit was 148/67, repeat-->137/65. The patient is currently taking Lisinopril 10 mg QD. His last blood pressure visits are:  Assessment and Plan -Continue lisinopril 10 mg daily -Follow-up in 6 months or sooner as needed

## 2018-02-09 ENCOUNTER — Ambulatory Visit (HOSPITAL_COMMUNITY)
Admission: RE | Admit: 2018-02-09 | Discharge: 2018-02-09 | Disposition: A | Discharge status: Home / Self Care | Payer: Medicare Other | Source: Ambulatory Visit | Attending: Internal Medicine | Admitting: Internal Medicine

## 2018-02-09 DIAGNOSIS — M25471 Effusion, right ankle: Secondary | ICD-10-CM | POA: Diagnosis not present

## 2018-02-09 NOTE — Progress Notes (Signed)
Right lower extremity venous duplex completed. Refer to "CV Proc" under chart review to view preliminary results.  02/09/2018 10:15 AM Gertie Fey, MHA, RVT, RDCS, RDMS

## 2018-02-12 NOTE — Progress Notes (Signed)
Internal Medicine Clinic Attending  Case discussed with Dr. Maryla Morrow at the time of the visit.  We reviewed the resident's history and exam and pertinent patient test results.  I agree with the assessment, diagnosis, and plan of care documented in the resident's note.   LE doppler negative for acute DVT

## 2018-02-22 DIAGNOSIS — M79671 Pain in right foot: Secondary | ICD-10-CM | POA: Diagnosis not present

## 2018-03-02 DIAGNOSIS — M14671 Charcot's joint, right ankle and foot: Secondary | ICD-10-CM | POA: Diagnosis not present

## 2018-03-02 DIAGNOSIS — M79671 Pain in right foot: Secondary | ICD-10-CM | POA: Diagnosis not present

## 2018-03-08 ENCOUNTER — Encounter: Payer: Medicare Other | Admitting: Internal Medicine

## 2018-03-16 DIAGNOSIS — M14671 Charcot's joint, right ankle and foot: Secondary | ICD-10-CM | POA: Diagnosis not present

## 2018-03-22 DIAGNOSIS — M79671 Pain in right foot: Secondary | ICD-10-CM | POA: Diagnosis not present

## 2018-03-22 DIAGNOSIS — M14671 Charcot's joint, right ankle and foot: Secondary | ICD-10-CM | POA: Diagnosis not present

## 2018-03-22 DIAGNOSIS — E13621 Other specified diabetes mellitus with foot ulcer: Secondary | ICD-10-CM | POA: Diagnosis not present

## 2018-03-30 DIAGNOSIS — M14671 Charcot's joint, right ankle and foot: Secondary | ICD-10-CM | POA: Diagnosis not present

## 2018-04-13 DIAGNOSIS — M14671 Charcot's joint, right ankle and foot: Secondary | ICD-10-CM | POA: Diagnosis not present

## 2018-04-17 ENCOUNTER — Other Ambulatory Visit: Payer: Self-pay | Admitting: Internal Medicine

## 2018-04-17 DIAGNOSIS — K219 Gastro-esophageal reflux disease without esophagitis: Secondary | ICD-10-CM

## 2018-05-01 DIAGNOSIS — E113291 Type 2 diabetes mellitus with mild nonproliferative diabetic retinopathy without macular edema, right eye: Secondary | ICD-10-CM | POA: Diagnosis not present

## 2018-05-01 DIAGNOSIS — H401132 Primary open-angle glaucoma, bilateral, moderate stage: Secondary | ICD-10-CM | POA: Diagnosis not present

## 2018-05-01 DIAGNOSIS — E113392 Type 2 diabetes mellitus with moderate nonproliferative diabetic retinopathy without macular edema, left eye: Secondary | ICD-10-CM | POA: Diagnosis not present

## 2018-05-01 LAB — HM DIABETES EYE EXAM

## 2018-05-02 DIAGNOSIS — M14671 Charcot's joint, right ankle and foot: Secondary | ICD-10-CM | POA: Diagnosis not present

## 2018-05-02 DIAGNOSIS — M79671 Pain in right foot: Secondary | ICD-10-CM | POA: Diagnosis not present

## 2018-05-16 DIAGNOSIS — M14671 Charcot's joint, right ankle and foot: Secondary | ICD-10-CM | POA: Diagnosis not present

## 2018-05-30 DIAGNOSIS — L89899 Pressure ulcer of other site, unspecified stage: Secondary | ICD-10-CM | POA: Diagnosis not present

## 2018-05-30 DIAGNOSIS — M14671 Charcot's joint, right ankle and foot: Secondary | ICD-10-CM | POA: Diagnosis not present

## 2018-05-30 DIAGNOSIS — M79671 Pain in right foot: Secondary | ICD-10-CM | POA: Diagnosis not present

## 2018-06-13 DIAGNOSIS — L89899 Pressure ulcer of other site, unspecified stage: Secondary | ICD-10-CM | POA: Diagnosis not present

## 2018-06-13 DIAGNOSIS — M14671 Charcot's joint, right ankle and foot: Secondary | ICD-10-CM | POA: Diagnosis not present

## 2018-06-28 DIAGNOSIS — M14671 Charcot's joint, right ankle and foot: Secondary | ICD-10-CM | POA: Diagnosis not present

## 2018-06-28 DIAGNOSIS — M79671 Pain in right foot: Secondary | ICD-10-CM | POA: Diagnosis not present

## 2018-07-11 DIAGNOSIS — H401132 Primary open-angle glaucoma, bilateral, moderate stage: Secondary | ICD-10-CM | POA: Diagnosis not present

## 2018-07-11 DIAGNOSIS — H401112 Primary open-angle glaucoma, right eye, moderate stage: Secondary | ICD-10-CM | POA: Diagnosis not present

## 2018-07-16 ENCOUNTER — Other Ambulatory Visit: Payer: Self-pay | Admitting: Internal Medicine

## 2018-07-16 DIAGNOSIS — H4312 Vitreous hemorrhage, left eye: Secondary | ICD-10-CM | POA: Diagnosis not present

## 2018-07-16 DIAGNOSIS — I10 Essential (primary) hypertension: Secondary | ICD-10-CM

## 2018-07-16 DIAGNOSIS — E113511 Type 2 diabetes mellitus with proliferative diabetic retinopathy with macular edema, right eye: Secondary | ICD-10-CM | POA: Diagnosis not present

## 2018-07-16 DIAGNOSIS — H35033 Hypertensive retinopathy, bilateral: Secondary | ICD-10-CM | POA: Diagnosis not present

## 2018-07-16 DIAGNOSIS — E1169 Type 2 diabetes mellitus with other specified complication: Secondary | ICD-10-CM

## 2018-07-16 DIAGNOSIS — E113592 Type 2 diabetes mellitus with proliferative diabetic retinopathy without macular edema, left eye: Secondary | ICD-10-CM | POA: Diagnosis not present

## 2018-07-16 DIAGNOSIS — K219 Gastro-esophageal reflux disease without esophagitis: Secondary | ICD-10-CM

## 2018-07-23 DIAGNOSIS — E113511 Type 2 diabetes mellitus with proliferative diabetic retinopathy with macular edema, right eye: Secondary | ICD-10-CM | POA: Diagnosis not present

## 2018-07-23 DIAGNOSIS — E113592 Type 2 diabetes mellitus with proliferative diabetic retinopathy without macular edema, left eye: Secondary | ICD-10-CM | POA: Diagnosis not present

## 2018-08-06 ENCOUNTER — Other Ambulatory Visit: Payer: Self-pay

## 2018-08-06 ENCOUNTER — Encounter: Payer: Self-pay | Admitting: Internal Medicine

## 2018-08-06 ENCOUNTER — Ambulatory Visit (INDEPENDENT_AMBULATORY_CARE_PROVIDER_SITE_OTHER): Payer: Medicare Other | Admitting: Internal Medicine

## 2018-08-06 VITALS — BP 119/54 | HR 67 | Temp 98.3°F | Ht 69.0 in | Wt 267.4 lb

## 2018-08-06 DIAGNOSIS — S91332D Puncture wound without foreign body, left foot, subsequent encounter: Secondary | ICD-10-CM

## 2018-08-06 DIAGNOSIS — Z7984 Long term (current) use of oral hypoglycemic drugs: Secondary | ICD-10-CM

## 2018-08-06 DIAGNOSIS — I10 Essential (primary) hypertension: Secondary | ICD-10-CM

## 2018-08-06 DIAGNOSIS — Z87891 Personal history of nicotine dependence: Secondary | ICD-10-CM | POA: Diagnosis not present

## 2018-08-06 DIAGNOSIS — E11610 Type 2 diabetes mellitus with diabetic neuropathic arthropathy: Secondary | ICD-10-CM

## 2018-08-06 DIAGNOSIS — X58XXXD Exposure to other specified factors, subsequent encounter: Secondary | ICD-10-CM

## 2018-08-06 DIAGNOSIS — Z79899 Other long term (current) drug therapy: Secondary | ICD-10-CM

## 2018-08-06 DIAGNOSIS — E1169 Type 2 diabetes mellitus with other specified complication: Secondary | ICD-10-CM

## 2018-08-06 LAB — GLUCOSE, CAPILLARY: Glucose-Capillary: 118 mg/dL — ABNORMAL HIGH (ref 70–99)

## 2018-08-06 LAB — POCT GLYCOSYLATED HEMOGLOBIN (HGB A1C): Hemoglobin A1C: 6.6 % — AB (ref 4.0–5.6)

## 2018-08-06 MED ORDER — JANUMET XR 50-500 MG PO TB24: 1.0000 | ORAL_TABLET | Freq: Every day | ORAL | 1 refills | Status: DC

## 2018-08-06 NOTE — Patient Instructions (Addendum)
It was our pleasure taking care of you in our clinic today.  You were seen for follow-up of diabetes and blood pressure check. I change your diabetes medication. Please take 1 tablet of Janumet daily and stop Metfromin and Januvia. Take rest of your medications as before. Call your orthopedic doctor to get an appointment. We do some blood work and will call you if the result will be abnormal. Please come back to clinic in 4 weeks or earlier as needed. As always, if having severe symptoms, please seek medical attention at emergency room.  Thanks, Dr. Linna Hoff

## 2018-08-06 NOTE — Progress Notes (Signed)
   CC: Diabetes follow-up HPI:  [name] is a 62 y.o. with PMHx as documented below, presented for follow-up of DM and hypertension. Please refer to problem based charting for further details and assessment of plan of current problem and chronic medical conditions.   Past Medical History:  Diagnosis Date  . Chronic normocytic anemia   . CKD (chronic kidney disease) stage 3, GFR 30-59 ml/min (HCC) 02/02/2013  . Diabetes mellitus type II, controlled ([place]) 06/17/2009  . Essential hypertension 06/17/2009  . GERD (gastroesophageal reflux disease)   . Glaucoma   . H/O hiatal hernia   . Hyperlipidemia   . MRSA (methicillin resistant Staphylococcus aureus)   . Obstructive sleep apnea 05/26/2010   Review of Systems:  Review of Systems  Constitutional: Negative for chills and fever.  Respiratory: Negative for cough and shortness of breath.   Cardiovascular: Negative for chest pain.  Neurological: Negative for dizziness and headaches.   Physical Exam:  Vitals:   08/06/18 1023  BP: (!) 119/54  Pulse: 67  Temp: 98.3 F (36.8 C)  TempSrc: Oral  SpO2: 100%  Weight: 267 lb 6.4 oz (121.3 kg)  Height: 5\' 9"  (1.753 m)   Physical Exam  Constitutional: Well-developed and well-nourished. No acute distress.  Cardiovascular:  RRR, nl S1S2, no murmur, Trace LEE Respiratory: Effort normal and breath sounds normal. No respiratory distress. No wheezes.  GI: Soft. Bowel sounds are normal. No distension. There is no tenderness.  Musculosceletal: Rt foot is in boot: Join deformity, no pen wound at right foot. Puncture wound at plantar aspect of left foot, no drainage, no tenderness. Capillary refill is normal. Neurological: Is alert and oriented x 3  Skin: Not diaphoretic. No erythema.  Psychiatric: Normal mood and affect. Behavior is normal. Judgment and thought content normal.      Assessment & Plan:   See Encounters Tab for problem based charting.  Patient discussed with Dr.  Dareen Piano

## 2018-08-06 NOTE — Assessment & Plan Note (Addendum)
She is adherent to her medications. Denies dizziness unless she does not eat for a long time. Lowest BG at home 90s Highest BG at home: 150s  -HbA1c -Replacing Metformin 500 mg BID and Januvia 50 mg QD with: Janumet XR 50-500 QD. (May excalate as needed in future) -F/u in clinic in 4 weeks

## 2018-08-07 LAB — BMP8+ANION GAP
Anion Gap: 12 mmol/L (ref 10.0–18.0)
BUN/Creatinine Ratio: 18 (ref 12–28)
BUN: 26 mg/dL (ref 8–27)
CO2: 24 mmol/L (ref 20–29)
Calcium: 10 mg/dL (ref 8.7–10.3)
Chloride: 102 mmol/L (ref 96–106)
Creatinine, Ser: 1.42 mg/dL — ABNORMAL HIGH (ref 0.57–1.00)
GFR calc Af Amer: 46 mL/min/{1.73_m2} — ABNORMAL LOW (ref >59)
GFR calc non Af Amer: 40 mL/min/{1.73_m2} — ABNORMAL LOW (ref >59)
Glucose: 112 mg/dL — ABNORMAL HIGH (ref 65–99)
Potassium: 4.9 mmol/L (ref 3.5–5.2)
Sodium: 138 mmol/L (ref 134–144)

## 2018-08-07 NOTE — Assessment & Plan Note (Addendum)
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 She follows up with orthopedic surgeon regularly for that. Reports improvement in pain and swelling. Has a boot on. Joint deformity is present.   -Continue F/u with orthopedic surgery

## 2018-08-07 NOTE — Assessment & Plan Note (Signed)
Controlled. BP today is 119/54. Denies any low BP at home. (mentions that her BP mainly ranges from 100s110s-60s). No dizziness, lightheadedness.   -Continue Lisinopril 10 mg QD -Checking BMP today

## 2018-08-08 DIAGNOSIS — S91339A Puncture wound without foreign body, unspecified foot, initial encounter: Secondary | ICD-10-CM | POA: Insufficient documentation

## 2018-08-08 DIAGNOSIS — E11621 Type 2 diabetes mellitus with foot ulcer: Secondary | ICD-10-CM | POA: Insufficient documentation

## 2018-08-08 NOTE — Assessment & Plan Note (Addendum)
patient has a puncture wound without drainage or active infection at plantar aspect of left foot. No systemic symptoms. She checks the foot every day and mentions that did not notice any worsening of wound or pus but reports small amount of bloody draignage from the wound today. Mentions that her orthopedic surgeon has been aware of this wound and has recommended to observe it. Next appointment is in a month. -Recommending to call her orthopedic surgeon for an early appointment . Patient agrees to do so

## 2018-08-09 NOTE — Addendum Note (Signed)
Addended by: Aldine Contes on: 08/09/2018 01:46 PM   Modules accepted: Level of Service

## 2018-08-09 NOTE — Progress Notes (Signed)
Internal Medicine Clinic Attending  Case discussed with Dr. Masoudi  at the time of the visit.  We reviewed the resident's history and exam and pertinent patient test results.  I agree with the assessment, diagnosis, and plan of care documented in the resident's note.  

## 2018-08-13 DIAGNOSIS — L97522 Non-pressure chronic ulcer of other part of left foot with fat layer exposed: Secondary | ICD-10-CM | POA: Diagnosis not present

## 2018-08-20 DIAGNOSIS — E113592 Type 2 diabetes mellitus with proliferative diabetic retinopathy without macular edema, left eye: Secondary | ICD-10-CM | POA: Diagnosis not present

## 2018-08-20 DIAGNOSIS — E113511 Type 2 diabetes mellitus with proliferative diabetic retinopathy with macular edema, right eye: Secondary | ICD-10-CM | POA: Diagnosis not present

## 2018-08-30 ENCOUNTER — Other Ambulatory Visit: Payer: Self-pay

## 2018-08-30 ENCOUNTER — Ambulatory Visit (INDEPENDENT_AMBULATORY_CARE_PROVIDER_SITE_OTHER): Payer: Medicare Other | Admitting: Internal Medicine

## 2018-08-30 ENCOUNTER — Encounter: Payer: Self-pay | Admitting: Internal Medicine

## 2018-08-30 VITALS — BP 138/60 | HR 65 | Temp 98.1°F | Ht 69.0 in | Wt 267.8 lb

## 2018-08-30 DIAGNOSIS — D649 Anemia, unspecified: Secondary | ICD-10-CM

## 2018-08-30 DIAGNOSIS — S91332D Puncture wound without foreign body, left foot, subsequent encounter: Secondary | ICD-10-CM

## 2018-08-30 DIAGNOSIS — M14672 Charcot's joint, left ankle and foot: Secondary | ICD-10-CM | POA: Diagnosis not present

## 2018-08-30 DIAGNOSIS — E1169 Type 2 diabetes mellitus with other specified complication: Secondary | ICD-10-CM

## 2018-08-30 DIAGNOSIS — X58XXXD Exposure to other specified factors, subsequent encounter: Secondary | ICD-10-CM

## 2018-08-30 DIAGNOSIS — L97522 Non-pressure chronic ulcer of other part of left foot with fat layer exposed: Secondary | ICD-10-CM | POA: Diagnosis not present

## 2018-08-30 DIAGNOSIS — M14671 Charcot's joint, right ankle and foot: Secondary | ICD-10-CM | POA: Diagnosis not present

## 2018-08-30 DIAGNOSIS — L97519 Non-pressure chronic ulcer of other part of right foot with unspecified severity: Secondary | ICD-10-CM | POA: Diagnosis not present

## 2018-08-30 NOTE — Progress Notes (Signed)
   CC: Diabetes follow-up  HPI:  [name] is a 63 y.o. female with PMHx as documented below, presented for follow-up of diabetes mellitus. Please refer to problem based charting for further details and assessment and plan of current problem and chronic medical conditions.   Past Medical History:  Diagnosis Date  . Chronic normocytic anemia   . CKD (chronic kidney disease) stage 3, GFR 30-59 ml/min (HCC) 02/02/2013  . Diabetes mellitus type II, controlled ([place]) 06/17/2009  . Essential hypertension 06/17/2009  . GERD (gastroesophageal reflux disease)   . Glaucoma   . H/O hiatal hernia   . Hyperlipidemia   . MRSA (methicillin resistant Staphylococcus aureus)   . Obstructive sleep apnea 05/26/2010   Review of Systems:  Denies fever, chills, nausea, vomiting, abdominal pain, chest pain, shortness of breath  Physical Exam:  Vitals:   08/30/18 0849  BP: (!) 169/67  Pulse: 71  Temp: 98.1 F (36.7 C)  SpO2: 100%  Weight: 267 lb 12.8 oz (121.5 kg)  Height: 5\' 9"  (1.753 m)   VS reviewed, nursing notes reviewed. General: No acute distress, appears comfortable CV: RRR, no murmur Pulm: Normal effort, no wheezing, no crackle Musculoskeletal:  Right foot:  Left foot: Deformity, no erythema, no swelling, non infected but deep puncture wound at plantar aspect of foot, no drainage, no tenderness  Assessment & Plan:   See Encounters Tab for problem based charting.  Patient discussed with Dr. Rebeca Alert

## 2018-08-30 NOTE — Patient Instructions (Addendum)
It was our pleasure taking care of you in our clinic today.  I send you to lab for some blood work and will call you with the result. Please make sure to follow up with orthopedics as scheduled for you for your foot ulcer. Take your medications as before and follow up in clinic in 2 months.  Please bring your medications and your blood pressure log as well as your meter with you.   Thank you

## 2018-08-31 LAB — CBC
Hematocrit: 31 % — ABNORMAL LOW (ref 34.0–46.6)
Hemoglobin: 10.3 g/dL — ABNORMAL LOW (ref 11.1–15.9)
MCH: 29.2 pg (ref 26.6–33.0)
MCHC: 33.2 g/dL (ref 31.5–35.7)
MCV: 88 fL (ref 79–97)
Platelets: 226 10*3/uL (ref 150–450)
RBC: 3.53 x10E6/uL — ABNORMAL LOW (ref 3.77–5.28)
RDW: 13.6 % (ref 11.7–15.4)
WBC: 8.9 10*3/uL (ref 3.4–10.8)

## 2018-08-31 LAB — MICROALBUMIN / CREATININE URINE RATIO
Creatinine, Urine: 95.5 mg/dL
Microalb/Creat Ratio: 5 mg/g creat (ref 0–29)
Microalbumin, Urine: 5 ug/mL

## 2018-08-31 LAB — C-REACTIVE PROTEIN: CRP: 51 mg/L — ABNORMAL HIGH (ref 0–10)

## 2018-09-01 NOTE — Assessment & Plan Note (Signed)
Still has unhealing puncture wound. No drainage, tenderness, no fever or chills but given deep ulcer and being diabetic, should closely observe for osteomyelitis. She is frequently been seen by her orthopedic surgeon and was recommended to closely monitor that.    -CBC and CRP  -F/u with orthopedic surgeon -Close monitoring of ulcer -Informed patient about warning symptoms that warrant return in clinic or ED visit

## 2018-09-01 NOTE — Assessment & Plan Note (Addendum)
Patient reports compliant to Janumet.  Has not brought her meter to clinic today but reports that her max BG is 140s-150s. -Checking Micro alb/cr today -Continue Janumet 50-500 QD -F/u in clinic in 75months for HbA1c

## 2018-09-03 ENCOUNTER — Other Ambulatory Visit: Payer: Self-pay | Admitting: Internal Medicine

## 2018-09-03 DIAGNOSIS — S91332D Puncture wound without foreign body, left foot, subsequent encounter: Secondary | ICD-10-CM

## 2018-09-03 NOTE — Progress Notes (Signed)
Internal Medicine Clinic Attending  Case discussed with Dr. Myrtie Hawk at the time of the visit.  We reviewed the resident's history and exam and pertinent patient test results.  I agree with the assessment, diagnosis, and plan of care documented in the resident's note.  CRP is quite elevated at 51, discussed with Dr. Myrtie Hawk. Had recent X-rays at ortho, will try to obtain results, but reportedly normal. Given depth of wound, concern for osteomyelitis, will likely need further evaluation with MRI.  Lenice Pressman, M.D., Ph.D.

## 2018-09-03 NOTE — Progress Notes (Unsigned)
CRP elevated at 51. (Checked in setting of left foot puncture wound) Ordering foot X-ray for osetomyelitis. If negative, should consider MRI.   I called patient and notified her about that.  She says that orthopedic surgeon after our last visit and they have performed X-ray and she was told there is no new changes.

## 2018-09-11 DIAGNOSIS — H401132 Primary open-angle glaucoma, bilateral, moderate stage: Secondary | ICD-10-CM | POA: Diagnosis not present

## 2018-09-17 ENCOUNTER — Encounter: Payer: Self-pay | Admitting: *Deleted

## 2018-09-17 DIAGNOSIS — L97522 Non-pressure chronic ulcer of other part of left foot with fat layer exposed: Secondary | ICD-10-CM | POA: Diagnosis not present

## 2018-09-17 DIAGNOSIS — L97519 Non-pressure chronic ulcer of other part of right foot with unspecified severity: Secondary | ICD-10-CM | POA: Diagnosis not present

## 2018-09-17 DIAGNOSIS — M14671 Charcot's joint, right ankle and foot: Secondary | ICD-10-CM | POA: Diagnosis not present

## 2018-09-17 DIAGNOSIS — M14672 Charcot's joint, left ankle and foot: Secondary | ICD-10-CM | POA: Diagnosis not present

## 2018-09-17 NOTE — Progress Notes (Unsigned)
Dear PCP Your patient is scheduled for a Medicare annual wellness visit with the RN.  Please fill out the following required information.  You do not have to address every Medicare Covered Preventative Screenings and Services listed in the chart below but selected the items most meaningful to your patient and limit the number so that it is manageable for the patient.  Please return the form within the next 7 days to Haruty[facility]ver[facility].    Things That May Be Affecting Your Health:  Alcohol  Hearing loss  Pain    Depression  Home Safety  Sexual Health   Diabetes  Lack of physical activity  Stress   Difficulty with daily activities  Loneliness  Tiredness   Drug use  Medicines  Tobacco use   Falls  Motor Vehicle Safety  Weight   Food choices  Oral Health  Other    YOUR PERSONALIZED HEALTH PLAN : 1. Schedule your next subsequent Medicare Wellness visit in one year 2. Attend all of your regular appointments to address your medical issues 3. Complete the preventative screenings and services   Annual Wellness Visit   Medicare Covered Preventative Screenings and Services  Services & Screenings Men and Women Who How Often Need? Date of Last Service Action  Abdominal Aortic Aneurysm Adults with AAA risk factors Once     Alcohol Misuse and Counseling All Adults Screening once a year if no alcohol misuse. Counseling up to 4 face to face sessions.     Bone Density Measurement  Adults at risk for osteoporosis Once every 2 yrs     Lipid Panel Z13.6 All adults without CV disease Once every 5 yrs     Colorectal Cancer   Stool sample or  Colonoscopy All adults 50 and older   Once every year  Every 10 years     Depression All Adults Once a year  Today   Diabetes Screening Blood glucose, post glucose load, or GTT Z13.1  All adults at risk  Pre-diabetics  Once per year  Twice per year     Diabetes  Self-Management Training All adults Diabetics 10 hrs first year; 2 hours subsequent years. Requires  Copay     Glaucoma  Diabetics  Family history of glaucoma  African Americans 50 yrs +  Hispanic Americans 65 yrs + Annually - requires coppay     Hepatitis C Z72.89 or F19.20  High Risk for HCV  Born between 1945 and 1965  Annually  Once     HIV Z11.4 All adults based on risk  Annually btw ages 15 & 65 regardless of risk  Annually > 65 yrs if at increased risk     Lung Cancer Screening Asymptomatic adults aged 55-77 with 30 pack yr history and current smoker OR quit within the last 15 yrs Annually Must have counseling and shared decision making documentation before first screen     Medical Nutrition Therapy Adults with   Diabetes  Renal disease  Kidney transplant within past 3 yrs 3 hours first year; 2 hours subsequent years     Obesity and Counseling All adults Screening once a year Counseling if BMI 30 or higher  Today   Tobacco Use Counseling Adults who use tobacco  Up to 8 visits in one year     Vaccines Z23  Hepatitis B  Influenza   Pneumonia  Adults   Once  Once every flu season  Two different vaccines separated by one year     Next Annual Wellness   Visit People with Medicare Every year  Today     Services & Screenings Women Who How Often Need  Date of Last Service Action  Mammogram  Z12.31 Women over 40 One baseline ages 35-39. Annually ager 40 yrs+     Pap tests All women Annually if high risk. Every 2 yrs for normal risk women     Screening for cervical cancer with   Pap (Z01.419 nl or Z01.411abnl) &  HPV Z11.51 Women aged 30 to 65 Once every 5 yrs     Screening pelvic and breast exams All women Annually if high risk. Every 2 yrs for normal risk women     Sexually Transmitted Diseases  Chlamydia  Gonorrhea  Syphilis All at risk adults Annually for non pregnant females at increased risk         Services & Screenings Men Who How Ofter Need  Date of Last Service Action  Prostate Cancer - DRE & PSA Men over 50 Annually.  DRE might  require a copay.     Sexually Transmitted Diseases  Syphilis All at risk adults Annually for men at increased risk       

## 2018-09-19 ENCOUNTER — Ambulatory Visit: Payer: Medicare Other | Admitting: Internal Medicine

## 2018-09-26 DIAGNOSIS — H401132 Primary open-angle glaucoma, bilateral, moderate stage: Secondary | ICD-10-CM | POA: Diagnosis not present

## 2018-10-03 DIAGNOSIS — M79671 Pain in right foot: Secondary | ICD-10-CM | POA: Diagnosis not present

## 2018-10-03 DIAGNOSIS — M14672 Charcot's joint, left ankle and foot: Secondary | ICD-10-CM | POA: Diagnosis not present

## 2018-10-03 DIAGNOSIS — L97519 Non-pressure chronic ulcer of other part of right foot with unspecified severity: Secondary | ICD-10-CM | POA: Diagnosis not present

## 2018-10-03 DIAGNOSIS — L97522 Non-pressure chronic ulcer of other part of left foot with fat layer exposed: Secondary | ICD-10-CM | POA: Diagnosis not present

## 2018-10-03 DIAGNOSIS — M14671 Charcot's joint, right ankle and foot: Secondary | ICD-10-CM | POA: Diagnosis not present

## 2018-10-04 DIAGNOSIS — E113592 Type 2 diabetes mellitus with proliferative diabetic retinopathy without macular edema, left eye: Secondary | ICD-10-CM | POA: Diagnosis not present

## 2018-10-04 DIAGNOSIS — E113511 Type 2 diabetes mellitus with proliferative diabetic retinopathy with macular edema, right eye: Secondary | ICD-10-CM | POA: Diagnosis not present

## 2018-10-07 ENCOUNTER — Other Ambulatory Visit: Payer: Self-pay | Admitting: Internal Medicine

## 2018-10-07 DIAGNOSIS — E1169 Type 2 diabetes mellitus with other specified complication: Secondary | ICD-10-CM

## 2018-10-18 DIAGNOSIS — M14672 Charcot's joint, left ankle and foot: Secondary | ICD-10-CM | POA: Diagnosis not present

## 2018-10-18 DIAGNOSIS — L97519 Non-pressure chronic ulcer of other part of right foot with unspecified severity: Secondary | ICD-10-CM | POA: Diagnosis not present

## 2018-10-18 DIAGNOSIS — M14671 Charcot's joint, right ankle and foot: Secondary | ICD-10-CM | POA: Diagnosis not present

## 2018-10-18 DIAGNOSIS — L97522 Non-pressure chronic ulcer of other part of left foot with fat layer exposed: Secondary | ICD-10-CM | POA: Diagnosis not present

## 2018-10-26 ENCOUNTER — Ambulatory Visit (INDEPENDENT_AMBULATORY_CARE_PROVIDER_SITE_OTHER): Payer: Medicare Other | Admitting: Internal Medicine

## 2018-10-26 ENCOUNTER — Encounter: Payer: Self-pay | Admitting: *Deleted

## 2018-10-26 ENCOUNTER — Encounter: Payer: Self-pay | Admitting: Internal Medicine

## 2018-10-26 ENCOUNTER — Other Ambulatory Visit: Payer: Self-pay

## 2018-10-26 DIAGNOSIS — Z0000 Encounter for general adult medical examination without abnormal findings: Secondary | ICD-10-CM

## 2018-10-26 DIAGNOSIS — Z6838 Body mass index (BMI) 38.0-38.9, adult: Secondary | ICD-10-CM

## 2018-10-26 DIAGNOSIS — E1169 Type 2 diabetes mellitus with other specified complication: Secondary | ICD-10-CM

## 2018-10-26 DIAGNOSIS — E669 Obesity, unspecified: Secondary | ICD-10-CM

## 2018-10-26 NOTE — Progress Notes (Signed)
This AWV is being conducted by TELEHEALTH - AUDIO only. The patient was located at home and I was located in [facility]. The patient's identity was confirmed using their DOB and current address. The patient or his/her legal guardian has consented to being evaluated through a telephone encounter and understands the associated risks (an examination cannot be done and the patient may need to come in for an appointment) / benefits (allows the patient to remain at home, decreasing exposure to coronavirus). I personally spent 55 minutes conducting the AWV.  Subjective:   [name] is a 63 y.o. female who presents for a Medicare Annual Wellness Visit.  The following items have been reviewed and updated today in the appropriate area in the EMR.   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   Smoked when she was a teenager   no alcohol   no Drugs      Current Social History 10/26/2018        Patient lives on the main level of a 3 floor boarding house with 3 other tenants. There are 3 steps with handrails up to the entrance the patient uses.       Patient's method of transportation is personal car.      The highest level of education was Bachelor's Degree.      The patient currently disabled.      Identified important Relationships are "My cousins, [name] and Lianne Moris, and my dear friend Mliss Sax."      Pets : None       Interests / Fun: "I love to read,  write, photography: go around [place] taking photos of the [address], [employer], Art Museums, cooking, hanging out with friends, several ministries with my Church, especially working with the youth."      Current Stressors: "Worrying about someone to care for me if anything happens to me. 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[place], BSN, RN-BC               Objective:    Vitals: There were no vitals taken for this visit. Vitals are unable to obtained due to COVID-19 public health emergency  Activities of Daily Living In your present state of health, do you have any difficulty performing the following activities: 10/26/2018 08/30/2018  Hearing? N N  Vision? Y Y  Comment Increased eye pressure bilat, receiving injections -  Difficulty concentrating or making decisions? N N  Walking or climbing stairs? Y Y  Comment 2/2  crutches -  Dressing or bathing? N N  Doing errands, shopping? N N  Some recent data might be hidden    Goals Goals    . HEMOGLOBIN A1C < 7.0    . Plan meals and snacks to have consistent carb content    . Weight (lb) < 254 lb (115.2 kg)     5% weight loss      Referral placed  to [facility] for help with weight loss goal  Fall Risk Fall Risk  10/26/2018 08/30/2018 08/06/2018 02/08/2018 10/11/2017  Falls in the past year? 0 0 0 0 No  Number falls in past yr: - 0 - - -  Risk for fall due to : Impaired balance/gait;Impaired mobility Impaired balance/gait Impaired balance/gait;Impaired mobility - Impaired balance/gait  Risk for fall due to: Comment - - - - -  Follow up Education provided;Falls prevention discussed - Falls prevention discussed Falls prevention discussed -  CDC Handout on Fall Prevention and Handout on Home Exercise Program, Access codes [email or web address] and [email or web address] mailed to patient with exercise band.    Depression Screen PHQ 2/9 Scores 10/26/2018 08/30/2018 08/06/2018 02/08/2018   PHQ - 2 Score 0 1 1 0  PHQ- 9 Score 0 3 3 -     Cognitive Testing Six-Item Cognitive Screener   "I would like to ask you some questions that ask you to use your memory. I am going to name three objects. Please wait until I say all three words, then repeat them. Remember what they are  because I am going to ask you to name them again in a few minutes. Please repeat these words for me: APPLE-TABLE-PENNY." (Interviewer may repeat names 3 times if necessary but repetition not scored.)  Did patient correctly repeat all three words? Yes - may proceed with screen  What year is this? Correct What month is this? Correct What day of the week is this? Correct  What were the three objects I asked you to remember? . Apple Correct . Table Unable to state . Penny Correct  Score one point for each incorrect answer.  A score of 2 or more points warrants additional investigation.  Patient's score 1    Assessment and Plan:   Patient had colonoscopy 07/07/2017. Report is in [place]. Eye exam done 09/26/2018. [facility] has called Dr. Trish Fountain office and requested the report. Referral placed to [facility] for help with 5% weight loss goal She will begin seated and standing exercises with exercise band Patient has appt to f/u with PCP on 11/08/2018  During the course of the visit the patient was educated and counseled about appropriate screening and preventive services as documented in the assessment and plan.  The printed AVS was given to the patient and included an updated screening schedule, a list of risk factors, and personalized health advice.        Velora Heckler, RN  10/26/2018

## 2018-10-26 NOTE — Progress Notes (Signed)
Internal Medicine Clinic Attending  Case discussed with Dr. Tarri Abernethy at the time of the visit.  We reviewed the AWV findings.  I agree with the assessment, diagnosis, and plan of care documented in the AWV note.

## 2018-10-26 NOTE — Progress Notes (Signed)
Called for complete eye exam report. Dr. Collier Salina Dunn's office agreed to fax it.

## 2018-10-26 NOTE — Patient Instructions (Addendum)
Annual Wellness Visit   Medicare Covered Preventative Screenings and Services  Services & Screenings Men and Women Who How Often Need? Date of Last Service Action  Abdominal Aortic Aneurysm Adults with AAA risk factors Once     Alcohol Misuse and Counseling All Adults Screening once a year if no alcohol misuse. Counseling up to 4 face to face sessions.     Bone Density Measurement  Adults at risk for osteoporosis Once every 2 yrs     Lipid Panel Z13.6 All adults without CV disease Once every 5 yrs     Colorectal Cancer   Stool sample or  Colonoscopy All adults 50 and older   Once every year  Every 10 years     Depression All Adults Once a year  Today   Diabetes Screening Blood glucose, post glucose load, or GTT Z13.1  All adults at risk  Pre-diabetics  Once per year  Twice per year     Diabetes  Self-Management Training All adults Diabetics 10 hrs first year; 2 hours subsequent years. Requires Copay YES    Glaucoma  Diabetics  Family history of glaucoma  African Americans 50 yrs +  Hispanic Americans 65 yrs + Annually - requires coppay     Hepatitis C Z72.89 or F19.20  High Risk for HCV  Born between 1945 and 1965  Annually  Once     HIV Z11.4 All adults based on risk  Annually btw ages 16 & 12 regardless of risk  Annually > 65 yrs if at increased risk     Lung Cancer Screening Asymptomatic adults aged 67-77 with 30 pack yr history and current smoker OR quit within the last 15 yrs Annually Must have counseling and shared decision making documentation before first screen     Medical Nutrition Therapy Adults with   Diabetes  Renal disease  Kidney transplant within past 3 yrs 3 hours first year; 2 hours subsequent years     Obesity and Counseling All adults Screening once a year Counseling if BMI 30 or higher Yes Today   Tobacco Use Counseling Adults who use tobacco  Up to 8 visits in one year     Vaccines Z23  Hepatitis B  Influenza   Pneumonia   Adults   Once  Once every flu season  Two different vaccines separated by one year     Next Annual Wellness Visit People with Medicare Every year  Today     Services & Screenings Women Who How Often Need  Date of Last Service Action  Mammogram  Z12.31 Women over 40 One baseline ages 23-39. Annually ager 40 yrs+     Pap tests All women Annually if high risk. Every 2 yrs for normal risk women     Screening for cervical cancer with   Pap (Z01.419 nl or Z01.411abnl) &  HPV Z11.51 Women aged 88 to 60 Once every 5 yrs     Screening pelvic and breast exams All women Annually if high risk. Every 2 yrs for normal risk women     Sexually Transmitted Diseases  Chlamydia  Gonorrhea  Syphilis All at risk adults Annually for non pregnant females at increased risk         Services & Screenings Men Who How Ofter Need  Date of Last Service Action  Prostate Cancer - DRE & PSA Men over 50 Annually.  DRE might require a copay.     Sexually Transmitted Diseases  Syphilis All at risk adults Annually  for men at increased risk         Things That May Be Affecting Your Health:  Alcohol  Hearing loss  Pain    Depression  Home Safety  Sexual Health  X Diabetes  Lack of physical activity  Stress  X Difficulty with daily activities  Loneliness  Tiredness   Drug use  Medicines  Tobacco use   Falls  Motor Vehicle Safety X Weight   Food choices  Oral Health  Other    YOUR PERSONALIZED HEALTH PLAN : 1. Schedule your next subsequent Medicare Wellness visit in one year 2. Attend all of your regular appointments to address your medical issues 3. Complete the preventative screenings and services 4. Congratulations on making a 5% weight loss goal! [facility] our Diabetic Educator/Nutritionist to help with this. 5. Lupita Leashonna contacted Dr. Telford Nabunn's office for the report on your eye exam so you won't need to :) 6. Begin seated and standing exercises with exercise band.  7. Please keep your appt with Dr. Maryla MorrowMasoudi on 11/08/2018 at 1:15 PM     Diabetes Mellitus and Foot Care Foot care is an important part of your health, especially when you have diabetes. Diabetes may cause you to have problems because of poor blood flow (circulation) to your feet and legs, which can cause your skin to:  Become thinner and drier.  Break more easily.  Heal more slowly.  Peel and crack. You may also have nerve damage (neuropathy) in your legs and feet, causing decreased feeling in them. This means that you may not notice minor injuries to your feet that could lead to more serious problems. Noticing and addressing any potential problems early is the best way to prevent future foot problems. How to care for your feet Foot hygiene  Wash your feet daily with warm water and mild soap. Do not use hot water. Then, pat your feet and the areas between your toes until they are completely dry. Do not soak your feet as this can dry your skin.  Trim your toenails straight across. Do not dig under them or around the cuticle. File the edges of your nails with an emery board or nail file.  Apply a moisturizing lotion or petroleum jelly to the skin on your feet and to dry, brittle toenails. Use lotion that does not contain alcohol and is unscented. Do not apply lotion between your toes. Shoes and socks  Wear clean socks or stockings every day. Make sure they are not too tight. Do not wear knee-high stockings since they may decrease blood flow to your legs.  Wear shoes that fit properly and have enough cushioning. Always look in your shoes before you put them on to be sure there are no objects inside.  To break in new shoes, wear them for just a few hours a day. This prevents injuries on your feet. Wounds, scrapes, corns, and calluses  Check your feet daily for blisters, cuts, bruises, sores, and redness. If you cannot see the bottom of your feet, use a mirror or ask someone for help.   Do not cut corns or calluses or try to remove them with medicine.  If you find a minor scrape, cut, or break in the skin on your feet, keep it and the skin around it clean and dry. You may clean these areas with mild soap and water. Do not clean the area with peroxide, alcohol, or iodine.  If you have a wound,  scrape, corn, or callus on your foot, look at it several times a day to make sure it is healing and not infected. Check for: ? Redness, swelling, or pain. ? Fluid or blood. ? Warmth. ? Pus or a bad smell. General instructions  Do not cross your legs. This may decrease blood flow to your feet.  Do not use heating pads or hot water bottles on your feet. They may burn your skin. If you have lost feeling in your feet or legs, you may not know this is happening until it is too late.  Protect your feet from hot and cold by wearing shoes, such as at the beach or on hot pavement.  Schedule a complete foot exam at least once a year (annually) or more often if you have foot problems. If you have foot problems, report any cuts, sores, or bruises to your health care provider immediately. Contact a health care provider if:  You have a medical condition that increases your risk of infection and you have any cuts, sores, or bruises on your feet.  You have an injury that is not healing.  You have redness on your legs or feet.  You feel burning or tingling in your legs or feet.  You have pain or cramps in your legs and feet.  Your legs or feet are numb.  Your feet always feel cold.  You have pain around a toenail. Get help right away if:  You have a wound, scrape, corn, or callus on your foot and: ? You have pain, swelling, or redness that gets worse. ? You have fluid or blood coming from the wound, scrape, corn, or callus. ? Your wound, scrape, corn, or callus feels warm to the touch. ? You have pus or a bad smell coming from the wound, scrape, corn, or callus. ? You have a fever. ?  You have a red line going up your leg. Summary  Check your feet every day for cuts, sores, red spots, swelling, and blisters.  Moisturize feet and legs daily.  Wear shoes that fit properly and have enough cushioning.  If you have foot problems, report any cuts, sores, or bruises to your health care provider immediately.  Schedule a complete foot exam at least once a year (annually) or more often if you have foot problems. This information is not intended to replace advice given to you by your health care provider. Make sure you discuss any questions you have with your health care provider. Document Released: 01/01/2000 Document Revised: 02/15/2017 Document Reviewed: 02/05/2016 Elsevier Patient Education  2020 [employer].   Fall Prevention in the Home, Adult Falls can cause injuries. They can happen to people of all ages. There are many things you can do to make your home safe and to help prevent falls. Ask for help when making these changes, if needed. What actions can I take to prevent falls? General Instructions  Use good lighting in all rooms. Replace any light bulbs that burn out.  Turn on the lights when you go into a dark area. Use night-lights.  Keep items that you use often in easy-to-reach places. Lower the shelves around your home if necessary.  Set up your furniture so you have a clear path. Avoid moving your furniture around.  Do not have throw rugs and other things on the floor that can make you trip.  Avoid walking on wet floors.  If any of your floors are uneven, fix them.  Add color or contrast  paint or tape to clearly mark and help you see: ? Any grab bars or handrails. ? First and last steps of stairways. ? Where the edge of each step is.  If you use a stepladder: ? Make sure that it is fully opened. Do not climb a closed stepladder. ? Make sure that both sides of the stepladder are locked into place. ? Ask someone to hold the stepladder for you while  you use it.  If there are any pets around you, be aware of where they are. What can I do in the bathroom?      Keep the floor dry. Clean up any water that spills onto the floor as soon as it happens.  Remove soap buildup in the tub or shower regularly.  Use non-skid mats or decals on the floor of the tub or shower.  Attach bath mats securely with double-sided, non-slip rug tape.  If you need to sit down in the shower, use a plastic, non-slip stool.  Install grab bars by the toilet and in the tub and shower. Do not use towel bars as grab bars. What can I do in the bedroom?  Make sure that you have a light by your bed that is easy to reach.  Do not use any sheets or blankets that are too big for your bed. They should not hang down onto the floor.  Have a firm chair that has side arms. You can use this for support while you get dressed. What can I do in the kitchen?  Clean up any spills right away.  If you need to reach something above you, use a strong step stool that has a grab bar.  Keep electrical cords out of the way.  Do not use floor polish or wax that makes floors slippery. If you must use wax, use non-skid floor wax. What can I do with my stairs?  Do not leave any items on the stairs.  Make sure that you have a light switch at the top of the stairs and the bottom of the stairs. If you do not have them, ask someone to add them for you.  Make sure that there are handrails on both sides of the stairs, and use them. Fix handrails that are broken or loose. Make sure that handrails are as long as the stairways.  Install non-slip stair treads on all stairs in your home.  Avoid having throw rugs at the top or bottom of the stairs. If you do have throw rugs, attach them to the floor with carpet tape.  Choose a carpet that does not hide the edge of the steps on the stairway.  Check any carpeting to make sure that it is firmly attached to the stairs. Fix any carpet that is  loose or worn. What can I do on the outside of my home?  Use bright outdoor lighting.  Regularly fix the edges of walkways and driveways and fix any cracks.  Remove anything that might make you trip as you walk through a door, such as a raised step or threshold.  Trim any bushes or trees on the path to your home.  Regularly check to see if handrails are loose or broken. Make sure that both sides of any steps have handrails.  Install guardrails along the edges of any raised decks and porches.  Clear walking paths of anything that might make someone trip, such as tools or rocks.  Have any leaves, snow, or ice cleared regularly.  Use sand or salt on walking paths during winter.  Clean up any spills in your garage right away. This includes grease or oil spills. What other actions can I take?  Wear shoes that: ? Have a low heel. Do not wear high heels. ? Have rubber bottoms. ? Are comfortable and fit you well. ? Are closed at the toe. Do not wear open-toe sandals.  Use tools that help you move around (mobility aids) if they are needed. These include: ? Canes. ? Walkers. ? Scooters. ? Crutches.  Review your medicines with your doctor. Some medicines can make you feel dizzy. This can increase your chance of falling. Ask your doctor what other things you can do to help prevent falls. Where to find more information  Centers for Disease Control and Prevention, [employer]: HealthcareCounselor.com.pt  [employer] on Aging: RingConnections.si Contact a doctor if:  You are afraid of falling at home.  You feel weak, drowsy, or dizzy at home.  You fall at home. Summary  There are many simple things that you can do to make your home safe and to help prevent falls.  Ways to make your home safe include removing tripping hazards and installing grab bars in the bathroom.  Ask for help when making these changes in your home. This information is not intended to replace advice given  to you by your health care provider. Make sure you discuss any questions you have with your health care provider. Document Released: 10/30/2008 Document Revised: 04/26/2018 Document Reviewed: 08/18/2016 Elsevier Patient Education  2020 [employer].   Health Maintenance, Female Adopting a healthy lifestyle and getting preventive care are important in promoting health and wellness. Ask your health care provider about:  The right schedule for you to have regular tests and exams.  Things you can do on your own to prevent diseases and keep yourself healthy. What should I know about diet, weight, and exercise? Eat a healthy diet   Eat a diet that includes plenty of vegetables, fruits, low-fat dairy products, and lean protein.  Do not eat a lot of foods that are high in solid fats, added sugars, or sodium. Maintain a healthy weight Body mass index (BMI) is used to identify weight problems. It estimates body fat based on height and weight. Your health care provider can help determine your BMI and help you achieve or maintain a healthy weight. Get regular exercise Get regular exercise. This is one of the most important things you can do for your health. Most adults should:  Exercise for at least 150 minutes each week. The exercise should increase your heart rate and make you sweat (moderate-intensity exercise).  Do strengthening exercises at least twice a week. This is in addition to the moderate-intensity exercise.  Spend less time sitting. Even light physical activity can be beneficial. Watch cholesterol and blood lipids Have your blood tested for lipids and cholesterol at 63 years of age, then have this test every 5 years. Have your cholesterol levels checked more often if:  Your lipid or cholesterol levels are high.  You are older than 63 years of age.  You are at high risk for heart disease. What should I know about cancer screening? Depending on your health history and family  history, you may need to have cancer screening at various ages. This may include screening for:  Breast cancer.  Cervical cancer.  Colorectal cancer.  Skin cancer.  Lung cancer. What should I know about heart disease, diabetes, and high blood pressure?  Blood pressure and heart disease  High blood pressure causes heart disease and increases the risk of stroke. This is more likely to develop in people who have high blood pressure readings, are of African descent, or are overweight.  Have your blood pressure checked: ? Every 3-5 years if you are 74-85 years of age. ? Every year if you are 63 years old or older. Diabetes Have regular diabetes screenings. This checks your fasting blood sugar level. Have the screening done:  Once every three years after age 12 if you are at a normal weight and have a low risk for diabetes.  More often and at a younger age if you are overweight or have a high risk for diabetes. What should I know about preventing infection? Hepatitis B If you have a higher risk for hepatitis B, you should be screened for this virus. Talk with your health care provider to find out if you are at risk for hepatitis B infection. Hepatitis C Testing is recommended for:  Everyone born from [birth year] through 1965.  Anyone with known risk factors for hepatitis C. Sexually transmitted infections (STIs)  Get screened for STIs, including gonorrhea and chlamydia, if: ? You are sexually active and are younger than 63 years of age. ? You are older than 63 years of age and your health care provider tells you that you are at risk for this type of infection. ? Your sexual activity has changed since you were last screened, and you are at increased risk for chlamydia or gonorrhea. Ask your health care provider if you are at risk.  Ask your health care provider about whether you are at high risk for HIV. Your health care provider may recommend a prescription medicine to help prevent HIV  infection. If you choose to take medicine to prevent HIV, you should first get tested for HIV. You should then be tested every 3 months for as long as you are taking the medicine. Pregnancy  If you are about to stop having your period (premenopausal) and you may become pregnant, seek counseling before you get pregnant.  Take 400 to 800 micrograms (mcg) of folic acid every day if you become pregnant.  Ask for birth control (contraception) if you want to prevent pregnancy. Osteoporosis and menopause Osteoporosis is a disease in which the bones lose minerals and strength with aging. This can result in bone fractures. If you are 20 years old or older, or if you are at risk for osteoporosis and fractures, ask your health care provider if you should:  Be screened for bone loss.  Take a calcium or vitamin D supplement to lower your risk of fractures.  Be given hormone replacement therapy (HRT) to treat symptoms of menopause. Follow these instructions at home: Lifestyle  Do not use any products that contain nicotine or tobacco, such as cigarettes, e-cigarettes, and chewing tobacco. If you need help quitting, ask your health care provider.  Do not use street drugs.  Do not share needles.  Ask your health care provider for help if you need support or information about quitting drugs. Alcohol use  Do not drink alcohol if: ? Your health care provider tells you not to drink. ? You are pregnant, may be pregnant, or are planning to become pregnant.  If you drink alcohol: ? Limit how much you use to 0-1 drink a day. ? Limit intake if you are breastfeeding.  Be aware of how much alcohol is in your drink. In the [place] one drink  equals one 12 oz bottle of beer (355 mL), one 5 oz glass of wine (148 mL), or one 1 oz glass of hard liquor (44 mL). General instructions  Schedule regular health, dental, and eye exams.  Stay current with your vaccines.  Tell your health care provider if: ? You  often feel depressed. ? You have ever been abused or do not feel safe at home. Summary  Adopting a healthy lifestyle and getting preventive care are important in promoting health and wellness.  Follow your health care provider's instructions about healthy diet, exercising, and getting tested or screened for diseases.  Follow your health care provider's instructions on monitoring your cholesterol and blood pressure. This information is not intended to replace advice given to you by your health care provider. Make sure you discuss any questions you have with your health care provider. Document Released: 07/19/2010 Document Revised: 12/27/2017 Document Reviewed: 12/27/2017 Elsevier Patient Education  2020 [employer].

## 2018-10-26 NOTE — Progress Notes (Signed)
I discussed the AWV findings with the RN who conducted the visit. I was present in the office suite and immediately available to provide assistance and direction throughout the time the service was provided.  Ina Homes MD IMTS PGY3

## 2018-11-02 DIAGNOSIS — M79671 Pain in right foot: Secondary | ICD-10-CM | POA: Diagnosis not present

## 2018-11-02 DIAGNOSIS — L97522 Non-pressure chronic ulcer of other part of left foot with fat layer exposed: Secondary | ICD-10-CM | POA: Diagnosis not present

## 2018-11-02 DIAGNOSIS — M14672 Charcot's joint, left ankle and foot: Secondary | ICD-10-CM | POA: Diagnosis not present

## 2018-11-02 DIAGNOSIS — M79672 Pain in left foot: Secondary | ICD-10-CM | POA: Diagnosis not present

## 2018-11-02 DIAGNOSIS — M14671 Charcot's joint, right ankle and foot: Secondary | ICD-10-CM | POA: Diagnosis not present

## 2018-11-02 DIAGNOSIS — L97519 Non-pressure chronic ulcer of other part of right foot with unspecified severity: Secondary | ICD-10-CM | POA: Diagnosis not present

## 2018-11-08 ENCOUNTER — Encounter: Payer: Medicare Other | Admitting: Internal Medicine

## 2018-11-15 DIAGNOSIS — H4312 Vitreous hemorrhage, left eye: Secondary | ICD-10-CM | POA: Diagnosis not present

## 2018-11-15 DIAGNOSIS — E113513 Type 2 diabetes mellitus with proliferative diabetic retinopathy with macular edema, bilateral: Secondary | ICD-10-CM | POA: Diagnosis not present

## 2018-11-15 DIAGNOSIS — H3582 Retinal ischemia: Secondary | ICD-10-CM | POA: Diagnosis not present

## 2018-11-15 DIAGNOSIS — H35033 Hypertensive retinopathy, bilateral: Secondary | ICD-10-CM | POA: Diagnosis not present

## 2018-11-16 DIAGNOSIS — L97522 Non-pressure chronic ulcer of other part of left foot with fat layer exposed: Secondary | ICD-10-CM | POA: Diagnosis not present

## 2018-11-16 DIAGNOSIS — M14671 Charcot's joint, right ankle and foot: Secondary | ICD-10-CM | POA: Diagnosis not present

## 2018-11-16 DIAGNOSIS — L97519 Non-pressure chronic ulcer of other part of right foot with unspecified severity: Secondary | ICD-10-CM | POA: Diagnosis not present

## 2018-11-16 DIAGNOSIS — M14672 Charcot's joint, left ankle and foot: Secondary | ICD-10-CM | POA: Diagnosis not present

## 2018-11-20 ENCOUNTER — Encounter: Payer: Medicare Other | Admitting: Dietician

## 2018-11-22 ENCOUNTER — Encounter: Payer: Medicare Other | Admitting: Dietician

## 2018-11-30 DIAGNOSIS — M79671 Pain in right foot: Secondary | ICD-10-CM | POA: Diagnosis not present

## 2018-11-30 DIAGNOSIS — E13621 Other specified diabetes mellitus with foot ulcer: Secondary | ICD-10-CM | POA: Diagnosis not present

## 2018-12-03 ENCOUNTER — Encounter: Payer: Self-pay | Admitting: Dietician

## 2018-12-03 ENCOUNTER — Ambulatory Visit (INDEPENDENT_AMBULATORY_CARE_PROVIDER_SITE_OTHER): Payer: Medicare Other | Admitting: Dietician

## 2018-12-03 ENCOUNTER — Ambulatory Visit (INDEPENDENT_AMBULATORY_CARE_PROVIDER_SITE_OTHER): Payer: Medicare Other | Admitting: Internal Medicine

## 2018-12-03 ENCOUNTER — Other Ambulatory Visit: Payer: Self-pay

## 2018-12-03 ENCOUNTER — Encounter: Payer: Self-pay | Admitting: Internal Medicine

## 2018-12-03 DIAGNOSIS — E119 Type 2 diabetes mellitus without complications: Secondary | ICD-10-CM

## 2018-12-03 DIAGNOSIS — Z6839 Body mass index (BMI) 39.0-39.9, adult: Secondary | ICD-10-CM

## 2018-12-03 DIAGNOSIS — E1169 Type 2 diabetes mellitus with other specified complication: Secondary | ICD-10-CM | POA: Diagnosis not present

## 2018-12-03 DIAGNOSIS — Z23 Encounter for immunization: Secondary | ICD-10-CM | POA: Diagnosis not present

## 2018-12-03 DIAGNOSIS — I10 Essential (primary) hypertension: Secondary | ICD-10-CM | POA: Diagnosis not present

## 2018-12-03 DIAGNOSIS — Z713 Dietary counseling and surveillance: Secondary | ICD-10-CM

## 2018-12-03 DIAGNOSIS — S91332D Puncture wound without foreign body, left foot, subsequent encounter: Secondary | ICD-10-CM

## 2018-12-03 DIAGNOSIS — K219 Gastro-esophageal reflux disease without esophagitis: Secondary | ICD-10-CM

## 2018-12-03 LAB — POCT GLYCOSYLATED HEMOGLOBIN (HGB A1C): Hemoglobin A1C: 7 % — AB (ref 4.0–5.6)

## 2018-12-03 LAB — GLUCOSE, CAPILLARY: Glucose-Capillary: 143 mg/dL — ABNORMAL HIGH (ref 70–99)

## 2018-12-03 MED ORDER — JANUMET XR 50-500 MG PO TB24: 1.0000 | ORAL_TABLET | Freq: Every day | ORAL | 1 refills | Status: DC

## 2018-12-03 MED ORDER — JANUMET XR 50-500 MG PO TB24: 1.0000 | ORAL_TABLET | Freq: Two times a day (BID) | ORAL | 1 refills | Status: DC

## 2018-12-03 MED ORDER — LISINOPRIL 10 MG PO TABS: 10.0000 mg | ORAL_TABLET | Freq: Every day | ORAL | 1 refills | Status: DC

## 2018-12-03 MED ORDER — PANTOPRAZOLE SODIUM 20 MG PO TBEC: DELAYED_RELEASE_TABLET | ORAL | 1 refills | Status: DC

## 2018-12-03 NOTE — Assessment & Plan Note (Signed)
The patient's blood pressure during this visit was controlled AT 131/65. The patient is currently taking Lisinopril 10 mg QD. Last micro albumin/Cr was normal. If need more BP control, will consider adding Amlodipine. -Continue current med -F/u in clinic in 3 months

## 2018-12-03 NOTE — Assessment & Plan Note (Signed)
Patient is currently on Janumet 50-500 mg QD.  -HbA1c-> increased to 7 (from 6.6 3 months ago).  -Increase Janumet to 50-500 mg BID (Notified patient about dose change) -F/u in clinic in 3 months

## 2018-12-03 NOTE — Patient Instructions (Addendum)
[name],   Thank you for your visit today!!  Here is a website where you can look at ideas to help give your cooking and meals Pizzazz.   NaturalStorm.com.au   Ideas for bedtime   greek yogurt Almonds Peanut Slice or 1 oz cheese  Try sauces for flavor      [name] [phone]

## 2018-12-03 NOTE — Progress Notes (Signed)
   CC: DM follow up  HPI:  [name] is a 63 y.o. female with PMHx as documented below, presented for DM follow up Please refer to problem based charting for further details and assessment and plan of current problem and chronic medical conditions.   Past Medical History:  Diagnosis Date  . Chronic normocytic anemia   . CKD (chronic kidney disease) stage 3, GFR 30-59 ml/min 02/02/2013  . Diabetes mellitus type II, controlled ([place]) 06/17/2009  . Essential hypertension 06/17/2009  . GERD (gastroesophageal reflux disease)   . Glaucoma   . H/O hiatal hernia   . Hyperlipidemia   . MRSA (methicillin resistant Staphylococcus aureus)   . Obstructive sleep apnea 05/26/2010   Review of Systems:   Review of Systems  Constitutional: Negative for chills and fever.  Respiratory: Negative for cough and shortness of breath.   Cardiovascular: Negative for chest pain.  Gastrointestinal: Negative for abdominal pain, nausea and vomiting.  Neurological: Negative for dizziness and headaches.    Physical Exam:  Vitals:   12/03/18 1315 12/03/18 1415  BP: (!) 149/68 131/65  Pulse: 71 67  Temp: 98.3 F (36.8 C)   SpO2: 100%   Weight: 266 lb 6.4 oz (120.8 kg)   Height: 5\' 9"  (1.753 m)    Physical Exam  Constitutional: Well-developed and well-nourished. Sitting on wheelchair in no acute distress.  Head: Normocephalic and atraumatic.  Eyes: Conjunctivae are normal, EOM nl Cardiovascular:  RRR, nl S1S2, no murmur Respiratory: Effort normal and breath sounds normal. No respiratory distress. No wheezes.  GI: Soft. Bowel sounds are normal. No distension. There is no tenderness.  Neurological: Is alert and oriented x 3  Skin: Not diaphoretic. No erythema.  Psychiatric: Normal mood and affect. Behavior is normal. Judgment and thought content normal.  Extremities: Puncture wound of left foot, no discharge, warmth or erythema, non infected wound of right foot with red clean base  Assessment &  Plan:   See Encounters Tab for problem based charting.  Patient discussed with Dr. Lynnae January

## 2018-12-03 NOTE — Patient Instructions (Addendum)
It was our pleasure taking care of you in our clinic today.  I did not change your medication today but sent refills for you. Make sure ti follow up with orthopedic surgeon and wound care if recommended. I check your A1c today.  Please take rest of your medications as instructed and contact us if you have any question or concern.  Please come back to clinic in 3 months or earlier if needed. As always, if having severe symptoms, please seek medical attention at emergency room.  Thank you

## 2018-12-03 NOTE — Assessment & Plan Note (Signed)
Refilled PPI 

## 2018-12-03 NOTE — Assessment & Plan Note (Addendum)
Patient is following with orthopedic surgeon every 2 weeks. She denies fever, chills, worsening of pain. She reports some occassional draignage from right foot wound but mentions that [name] is aware of that. Puncture wound of left foot is more closed now. No evidence of active infection. Last XRay without evidence of osteomyelitis. (MRI was not performed) Right foot has ~1x1 inch, non infected wound  with red clean base.     -F/u with [name] every 2 weeks  -Wound care  -Continue with diabetes control -Patient is aware of signs and symptoms of infection and ED visit precautions

## 2018-12-03 NOTE — Progress Notes (Signed)
Medical Nutrition Therapy Via Telemedicine (Telephone or Video Assisted Platform):  Appt start time: 2119 end time:  1500. Total time: 40 Visit # 1  12/03/2018 [name] [id number]  Assessment:  Primary concerns today: affirmation about food choices Met with [name] about her food intake per referral from Dr. Sherry [name] from her annual wellness visit.  She reports previous weight loss that she fears she may regain with her current non- weight bearing status.   Preferred Learning Style: No preference indicated  Learning Readiness: Ready ANTHROPOMETRICS: Estimated body mass index is 39.34 kg/m as calculated from the following:   Height as of an earlier encounter on 12/03/18: 5' 9" (1.753 m).   Weight as of an earlier encounter on 12/03/18: 266 lb 6.4 oz (120.8 kg).  WEIGHT HISTORY:  Wt Readings from Last 5 Encounters:  12/03/18 266 lb 6.4 oz (120.8 kg)  08/30/18 267 lb 12.8 oz (121.5 kg)  08/06/18 267 lb 6.4 oz (121.3 kg)  02/08/18 264 lb 12.8 oz (120.1 kg)  10/11/17 260 lb (117.9 kg)   SLEEP:need to assess at future visit  MEDICATIONS  Current Outpatient Medications on File Prior to Visit  Medication Sig Dispense Refill  . ACCU-CHEK SOFTCLIX LANCETS lancets Use to check blood glucose up to three times a day. Dx code E 11.9 100 each 12  . Acetaminophen (TYLENOL PO) Take 1,000 mg by mouth every 6 (six) hours as needed.    Marland Kitchen aspirin EC 81 MG tablet Take 1 tablet (81 mg total) by mouth daily. 90 tablet 3  . atorvastatin (LIPITOR) 40 MG tablet TAKE ONE TABLET BY MOUTH DAILY 90 tablet 1  . bimatoprost (LUMIGAN) 0.03 % ophthalmic solution Place 1 drop into both eyes at bedtime. (Patient not taking: Reported on 10/26/2018) 2.5 mL 0  . Biotin 1000 MCG tablet Take 1,000 mcg by mouth daily.    . brimonidine-timolol (COMBIGAN) 0.2-0.5 % ophthalmic solution Place 1 drop into both eyes every 12 (twelve) hours. Place one drop to both eyes twice daily for glaucoma 15 mL 0  .  calcium-vitamin D (OSCAL-500) 500-400 MG-UNIT tablet Take 1 tablet by mouth 2 (two) times daily.     . Cholecalciferol (VITAMIN D3) 25 MCG (1000 UT) CAPS Take 1 capsule by mouth daily.    . cycloSPORINE (RESTASIS) 0.05 % ophthalmic emulsion Place 1 drop into both eyes 2 (two) times daily.    . ferrous sulfate 325 (65 FE) MG tablet Take 1 tablet (325 mg total) by mouth daily with breakfast. 90 tablet 1  . glucose blood (ACCU-CHEK AVIVA) test strip Use to check blood glucose up to three times a day. Dx code E 11.9 100 each 12  . lisinopril (ZESTRIL) 10 MG tablet Take 1 tablet (10 mg total) by mouth daily. 90 tablet 1  . Multiple Vitamin (MULTIVITAMIN) tablet Take 1 tablet by mouth daily.     . Netarsudil-Latanoprost (ROCKLATAN) 0.02-0.005 % SOLN Apply 1 drop to eye daily at 10 pm.    . pantoprazole (PROTONIX) 20 MG tablet 1 tablet twice a day 180 tablet 1  . SitaGLIPtin-MetFORMIN HCl (JANUMET XR) 50-500 MG TB24 Take 1 tablet by mouth daily. 60 tablet 1  . vitamin B-12 (CYANOCOBALAMIN) 1000 MCG tablet Take 1,000 mcg by mouth daily.    . vitamin C (ASCORBIC ACID) 500 MG tablet Take 500 mg by mouth daily.    . Zinc 50 MG CAPS Take 1 capsule by mouth daily.    . [DISCONTINUED] pravastatin (PRAVACHOL) 20 MG tablet  Take 1 tablet (20 mg total) by mouth daily. 30 tablet 6   No current facility-administered medications on file prior to visit.     BLOOD SUGAR: Lab Results  Component Value Date   HGBA1C 7.0 (A) 12/03/2018   HGBA1C 6.6 (A) 08/06/2018   HGBA1C 7.1 (A) 02/08/2018   HGBA1C 6.7 05/18/2017   HGBA1C 7.3 01/06/2017     DIETARY INTAKE: Usual eating pattern includes 2-3 meals and  snacks per day.  Food Intolerances: lactose, reports no problem with constipation but recent decrease in appetite.  24-hr recall:  B ( AM): bagel with peanut butter and fruit L ( PM): fruit D ( PM): salad, baked chicken. 15 beans soup Beverages: water, almond milk  Usual physical activity: limited by charcot  foot  Progress Towards Goal(s):  Some progress.   Nutritional Diagnosis:  NB-1.1 Food and nutrition-related knowledge deficit As related to lack of adequate diabetes meal planning education.  As evidenced by her report.    Intervention:  Nutrition education about food choice for her diabetes and weight loss goals Action Goal: consider using websites to menu ideas  Outcome goal: improved nutrient density, food choices Coordination of care:   Teaching Method Utilized: Visual, Auditory,Hands on Handouts given during visit include: After visit summary Barriers to learning/adherence to lifestyle change: competing values Demonstrated degree of understanding via:  Teach Back   Monitoring/Evaluation:  Dietary intake, exercise, meter, and body weight as needed. Debera Lat, RD 12/03/2018 3:16 PM.

## 2018-12-05 ENCOUNTER — Other Ambulatory Visit: Payer: Self-pay | Admitting: Internal Medicine

## 2018-12-05 DIAGNOSIS — E1169 Type 2 diabetes mellitus with other specified complication: Secondary | ICD-10-CM

## 2018-12-05 NOTE — Progress Notes (Signed)
Internal Medicine Clinic Attending  Case discussed with Dr. Masoudi  at the time of the visit.  We reviewed the resident's history and exam and pertinent patient test results.  I agree with the assessment, diagnosis, and plan of care documented in the resident's note.  

## 2018-12-06 ENCOUNTER — Other Ambulatory Visit: Payer: Self-pay | Admitting: Internal Medicine

## 2018-12-06 DIAGNOSIS — E1169 Type 2 diabetes mellitus with other specified complication: Secondary | ICD-10-CM

## 2018-12-21 DIAGNOSIS — L97522 Non-pressure chronic ulcer of other part of left foot with fat layer exposed: Secondary | ICD-10-CM | POA: Diagnosis not present

## 2018-12-21 DIAGNOSIS — M14672 Charcot's joint, left ankle and foot: Secondary | ICD-10-CM | POA: Diagnosis not present

## 2018-12-21 DIAGNOSIS — M14671 Charcot's joint, right ankle and foot: Secondary | ICD-10-CM | POA: Diagnosis not present

## 2018-12-21 DIAGNOSIS — L97519 Non-pressure chronic ulcer of other part of right foot with unspecified severity: Secondary | ICD-10-CM | POA: Diagnosis not present

## 2018-12-26 DIAGNOSIS — H401132 Primary open-angle glaucoma, bilateral, moderate stage: Secondary | ICD-10-CM | POA: Diagnosis not present

## 2019-01-05 ENCOUNTER — Other Ambulatory Visit: Payer: Self-pay | Admitting: Internal Medicine

## 2019-01-05 DIAGNOSIS — K219 Gastro-esophageal reflux disease without esophagitis: Secondary | ICD-10-CM

## 2019-01-28 ENCOUNTER — Encounter (HOSPITAL_BASED_OUTPATIENT_CLINIC_OR_DEPARTMENT_OTHER): Payer: Medicare Other | Admitting: Internal Medicine

## 2019-01-28 ENCOUNTER — Other Ambulatory Visit: Payer: Self-pay

## 2019-01-28 DIAGNOSIS — E1122 Type 2 diabetes mellitus with diabetic chronic kidney disease: Secondary | ICD-10-CM | POA: Diagnosis not present

## 2019-01-28 DIAGNOSIS — G4733 Obstructive sleep apnea (adult) (pediatric): Secondary | ICD-10-CM | POA: Diagnosis not present

## 2019-01-28 DIAGNOSIS — Z888 Allergy status to other drugs, medicaments and biological substances status: Secondary | ICD-10-CM | POA: Diagnosis not present

## 2019-01-28 DIAGNOSIS — I129 Hypertensive chronic kidney disease with stage 1 through stage 4 chronic kidney disease, or unspecified chronic kidney disease: Secondary | ICD-10-CM | POA: Diagnosis not present

## 2019-01-28 DIAGNOSIS — Z841 Family history of disorders of kidney and ureter: Secondary | ICD-10-CM | POA: Diagnosis not present

## 2019-01-28 DIAGNOSIS — I252 Old myocardial infarction: Secondary | ICD-10-CM | POA: Diagnosis not present

## 2019-01-28 DIAGNOSIS — L97514 Non-pressure chronic ulcer of other part of right foot with necrosis of bone: Secondary | ICD-10-CM | POA: Diagnosis not present

## 2019-01-28 DIAGNOSIS — Z8349 Family history of other endocrine, nutritional and metabolic diseases: Secondary | ICD-10-CM | POA: Diagnosis not present

## 2019-01-28 DIAGNOSIS — Z823 Family history of stroke: Secondary | ICD-10-CM | POA: Insufficient documentation

## 2019-01-28 DIAGNOSIS — Z833 Family history of diabetes mellitus: Secondary | ICD-10-CM | POA: Diagnosis not present

## 2019-01-28 DIAGNOSIS — E11610 Type 2 diabetes mellitus with diabetic neuropathic arthropathy: Secondary | ICD-10-CM | POA: Insufficient documentation

## 2019-01-28 DIAGNOSIS — E11621 Type 2 diabetes mellitus with foot ulcer: Secondary | ICD-10-CM | POA: Diagnosis present

## 2019-01-28 DIAGNOSIS — E1151 Type 2 diabetes mellitus with diabetic peripheral angiopathy without gangrene: Secondary | ICD-10-CM | POA: Insufficient documentation

## 2019-01-28 DIAGNOSIS — Z87891 Personal history of nicotine dependence: Secondary | ICD-10-CM | POA: Diagnosis not present

## 2019-01-28 DIAGNOSIS — N183 Chronic kidney disease, stage 3 unspecified: Secondary | ICD-10-CM | POA: Diagnosis not present

## 2019-01-28 DIAGNOSIS — K219 Gastro-esophageal reflux disease without esophagitis: Secondary | ICD-10-CM | POA: Diagnosis not present

## 2019-01-28 DIAGNOSIS — M069 Rheumatoid arthritis, unspecified: Secondary | ICD-10-CM | POA: Insufficient documentation

## 2019-01-28 DIAGNOSIS — Z8249 Family history of ischemic heart disease and other diseases of the circulatory system: Secondary | ICD-10-CM | POA: Insufficient documentation

## 2019-01-28 DIAGNOSIS — E1142 Type 2 diabetes mellitus with diabetic polyneuropathy: Secondary | ICD-10-CM | POA: Diagnosis not present

## 2019-01-28 DIAGNOSIS — Z885 Allergy status to narcotic agent status: Secondary | ICD-10-CM | POA: Insufficient documentation

## 2019-01-28 DIAGNOSIS — D631 Anemia in chronic kidney disease: Secondary | ICD-10-CM | POA: Diagnosis not present

## 2019-01-28 DIAGNOSIS — H42 Glaucoma in diseases classified elsewhere: Secondary | ICD-10-CM | POA: Insufficient documentation

## 2019-01-28 DIAGNOSIS — E1139 Type 2 diabetes mellitus with other diabetic ophthalmic complication: Secondary | ICD-10-CM | POA: Insufficient documentation

## 2019-01-28 DIAGNOSIS — Z8614 Personal history of Methicillin resistant Staphylococcus aureus infection: Secondary | ICD-10-CM | POA: Insufficient documentation

## 2019-01-28 DIAGNOSIS — L97524 Non-pressure chronic ulcer of other part of left foot with necrosis of bone: Secondary | ICD-10-CM | POA: Diagnosis not present

## 2019-01-28 NOTE — Progress Notes (Signed)
[name], [name] ([id number]) Visit Report for 01/28/2019 Abuse/Suicide Risk Screen Details Patient Name: Date of Service: [name], [name] 01/28/2019 1:15 PM Medical Record [id number] Patient Account Number: [id number] Date of Birth/Sex: Treating RN: Sep 04, 1955 (64 y.o. Female) Yevonne Pax Primary Care Armen Waring: Chevis Pretty Other Clinician: Referring Aziah Brostrom: Treating Emre Stock/Extender:Robson, [name], [facility] Weeks in Treatment: 0 Abuse/Suicide Risk Screen Items Answer ABUSE RISK SCREEN: Has anyone close to you tried to hurt or harm you recentlyo No Do you feel uncomfortable with anyone in your familyo No Has anyone forced you do things that you didnt want to doo No Electronic Signature(s) Signed: 01/28/2019 6:08:38 PM By: Yevonne Pax RN Entered By: Yevonne Pax on 01/28/2019 13:56:26 -------------------------------------------------------------------------------- Activities of Daily Living Details Patient Name: Date of Service: [name], [name] 01/28/2019 1:15 PM Medical Record [id number] Patient Account Number: [id number] Date of Birth/Sex: Treating RN: [birth year] (64 y.o. Female) Yevonne Pax Primary Care Mckinzie Saksa: Chevis Pretty Other Clinician: Referring Taejon Irani: Treating Eraina Winnie/Extender:Robson, [name], [facility] Weeks in Treatment: 0 Activities of Daily Living Items Answer Activities of Daily Living (Please select one for each item) Drive Automobile Completely Able Take Medications Completely Able Use Telephone Completely Able Care for Appearance Completely Able Use Toilet Completely Able Bath / Shower Completely Able Dress Self Completely Able Feed Self Completely Able Walk Completely Able Get In / Out Bed Completely Able Housework Completely Able Prepare Meals Completely Able Handle Money Completely Able Shop for Self Completely Able Electronic Signature(s) Signed: 01/28/2019 6:08:38 PM By:  Yevonne Pax RN Entered By: Yevonne Pax on 01/28/2019 13:56:55 -------------------------------------------------------------------------------- Education Screening Details Patient Name: Date of Service: Sarah Jaeger. 01/28/2019 1:15 PM Medical Record [id number] Patient Account Number: [id number] Date of Birth/Sex: Treating RN: 1955/12/29 (63 y.o. Female) Yevonne Pax Primary Care Orvile Corona: Chevis Pretty Other Clinician: Referring Hanish Laraia: Treating Trendon Zaring/Extender:Robson, [name], [facility] Weeks in Treatment: 0 Primary Learner Assessed: Patient Learning Preferences/Education Level/Primary Language Learning Preference: Explanation Highest Education Level: College or Above Preferred Language: English Cognitive Barrier Language Barrier: No Translator Needed: No Memory Deficit: No Emotional Barrier: No Cultural/Religious Beliefs Affecting Medical Care: No Physical Barrier Impaired Vision: No Impaired Hearing: No Knowledge/Comprehension Knowledge Level: High Comprehension Level: High Ability to understand written High instructions: Ability to understand verbal High instructions: Motivation Anxiety Level: Calm Cooperation: Cooperative Education Importance: Acknowledges Need Interest in Health Problems: Asks Questions Perception: Coherent Willingness to Engage in Self- High Management Activities: Readiness to Engage in Self- High Management Activities: Electronic Signature(s) Signed: 01/28/2019 6:08:38 PM By: Yevonne Pax RN Entered By: Yevonne Pax on 01/28/2019 13:57:19 -------------------------------------------------------------------------------- Fall Risk Assessment Details Patient Name: Date of Service: Sarah Jaeger. 01/28/2019 1:15 PM Medical Record [id number] Patient Account Number: [id number] Date of Birth/Sex: Treating RN: 1955-08-11 (64 y.o. Female) Epps, [place] Primary Care Makynzee Tigges: Chevis Pretty Other Clinician: Referring Analeya Luallen: Treating Desire Fulp/Extender:Robson, [name], [facility] Weeks in Treatment: 0 Fall Risk Assessment Items Have you had 2 or more falls in the last 12 monthso 0 No Have you had any fall that resulted in injury in the last 12 monthso 0 No FALLS RISK SCREEN History of falling - immediate or within 3 months 0 No Secondary diagnosis (Do you have 2 or more medical diagnoseso) 0 No Ambulatory aid None/bed rest/wheelchair/nurse 0 No Crutches/cane/walker 0 No Furniture 0 No Intravenous therapy Access/Saline/Heparin Lock 0 No Weak (short steps with or without shuffle, stooped but able to lift head 0 No while walking, may seek support from furniture) Impaired (short steps with shuffle, may have difficulty arising from chair,  0 No head down, impaired balance) Mental Status Oriented to own ability 0 No Overestimates or forgets limitations 0 No Risk Level: Low Risk Score: 0 Electronic Signature(s) Signed: 01/28/2019 6:08:38 PM By: Carlene Coria RN Entered By: Carlene Coria on 01/28/2019 13:57:26 -------------------------------------------------------------------------------- Foot Assessment Details Patient Name: Date of Service: [name], [name] 01/28/2019 1:15 PM Medical Record [id number] Patient Account Number: [id number] Date of Birth/Sex: Treating RN: 1955-06-19 (63 y.o. Female) Epps, [place] Primary Care Qiara Minetti: Dewayne Hatch Other Clinician: Referring Tyrica Afzal: Treating Quentez Lober/Extender:Robson, [name], [facility] Weeks in Treatment: 0 Foot Assessment Items Site Locations + = Sensation present, - = Sensation absent, C = Callus, U = Ulcer R = Redness, W = Warmth, M = Maceration, PU = Pre-ulcerative lesion F = Fissure, S = Swelling, D = Dryness Assessment Right: Left: Other Deformity: No No Prior Foot Ulcer: No No Prior Amputation: No No Charcot Joint: No No Ambulatory Status: Ambulatory Without  Help Gait: Steady Electronic Signature(s) Signed: 01/28/2019 6:08:38 PM By: Carlene Coria RN Entered By: Carlene Coria on 01/28/2019 14:19:06 -------------------------------------------------------------------------------- Nutrition Risk Screening Details Patient Name: Date of Service: [name], [name] 01/28/2019 1:15 PM Medical Record [id number] Patient Account Number: [id number] Date of Birth/Sex: Treating RN: May 01, 1955 (64 y.o. Female) Epps, [place] Primary Care Catherina Pates: Dewayne Hatch Other Clinician: Referring Chantrice Hagg: Treating Eran Mistry/Extender:Robson, [name], [facility] Weeks in Treatment: 0 Height (in): 68 Weight (lbs): 265 Body Mass Index (BMI): 40.3 Nutrition Risk Screening Items Score Screening NUTRITION RISK SCREEN: I have an illness or condition that made me change the kind and/or 0 No amount of food I eat I eat fewer than two meals per day 0 No I eat few fruits and vegetables, or milk products 0 No I have three or more drinks of beer, liquor or wine almost every day 0 No I have tooth or mouth problems that make it hard for me to eat 0 No I don't always have enough money to buy the food I need 0 No I eat alone most of the time 0 No I take three or more different prescribed or over-the-counter drugs a day 1 Yes 0 No Without wanting to, I have lost or gained 10 pounds in the last six months I am not always physically able to shop, cook and/or feed myself 0 No Nutrition Protocols Good Risk Protocol 0 No interventions needed Moderate Risk Protocol High Risk Proctocol Risk Level: Good Risk Score: 1 Electronic Signature(s) Signed: 01/28/2019 6:08:38 PM By: Carlene Coria RN Entered By: Carlene Coria on 01/28/2019 13:57:35

## 2019-01-29 ENCOUNTER — Ambulatory Visit (HOSPITAL_COMMUNITY)
Admission: RE | Admit: 2019-01-29 | Discharge: 2019-01-29 | Disposition: A | Discharge status: Home / Self Care | Payer: Medicare Other | Source: Ambulatory Visit | Attending: Internal Medicine | Admitting: Internal Medicine

## 2019-01-29 ENCOUNTER — Other Ambulatory Visit (HOSPITAL_COMMUNITY): Payer: Self-pay | Admitting: Internal Medicine

## 2019-01-29 ENCOUNTER — Other Ambulatory Visit: Payer: Self-pay

## 2019-01-29 DIAGNOSIS — M86172 Other acute osteomyelitis, left ankle and foot: Secondary | ICD-10-CM | POA: Insufficient documentation

## 2019-01-29 NOTE — Progress Notes (Addendum)
Sarah, [name] ([id number]) Visit Report for 01/28/2019 Allergy List Details Patient Name: Date of Service: [name], [name] 01/28/2019 1:15 PM Medical Record [id number] Patient Account Number: [id number] Date of Birth/Sex: Treating RN: [birth year] (64 y.o. Female) Yevonne Pax Primary Care Tiffany Calmes: Chevis Pretty Other Clinician: Referring Taft Worthing: Treating Madelein Mahadeo/Extender:Robson, [name], [facility] Weeks in Treatment: 0 Allergies Active Allergies codeine Reaction: swelling in throat and itching Severity: Severe Active: 08/29/2014 pravastatin Reaction: cramps, intolerence Severity: Severe Active: 08/29/2014 Allergy Notes Electronic Signature(s) Signed: 01/28/2019 6:08:38 PM By: Yevonne Pax RN Entered By: Yevonne Pax on 01/28/2019 13:49:25 -------------------------------------------------------------------------------- Arrival Information Details Patient Name: Date of Service: [name]. 01/28/2019 1:15 PM Medical Record [id number] Patient Account Number: [id number] Date of Birth/Sex: Treating RN: 06-May-1955 (63 y.o. Female) Epps, Carrie Primary Care Nahum Sherrer: Chevis Pretty Other Clinician: Referring Joniah Bednarski: Treating Mellissa Conley/Extender:Robson, [name], [facility] Weeks in Treatment: 0 Visit Information Patient Arrived: Crutches Arrival Time: 13:38 Accompanied By: self Transfer Assistance: None Patient Identification Verified: Yes Patient Identification Verified: Yes r Secondary Verification Process Completed: Yes S Patient Requires Transmission-Based No v Precautions: H Patient Has Alerts: No I c s H P History Since Last Visit All ordered tests and consults were completed: No Added or deleted any medications: No Any new allergies or adverse reactions: No Had a fall or experienced change in activities of daily living that may affect isk of falls: No igns or symptoms of abuse/neglect since  last isito No ospitalized since last visit: No mplantable device outside of the clinic excluding ellular tissue based products placed in the center ince last visit: No as Dressing in Place as Prescribed: Yes Electronic Signature(s) Signed: 01/28/2019 6:08:38 PM By: Yevonne Pax RN Entered By: Yevonne Pax on 01/28/2019 13:48:17 -------------------------------------------------------------------------------- Clinic Level of Care Assessment Details Patient Name: Date of Service: KASON, CARN 01/28/2019 1:15 PM Medical Record [id number] Patient Account Number: [id number] Date of Birth/Sex: Treating RN: Sep 11, 1955 (64 y.o. Female) [name] Primary Care Ivett Luebbe: Chevis Pretty Other Clinician: Referring Thurma Priego: Treating Kayla Deshaies/Extender:Robson, [name], [facility] Weeks in Treatment: 0 Clinic Level of Care Assessment Items TOOL 1 Quantity Score X - Use when EandM and Procedure is performed on INITIAL visit 1 0 ASSESSMENTS - Nursing Assessment / Reassessment X - General Physical Exam (combine w/ comprehensive assessment (listed just below) 1 20 when performed on new pt. evals) X - Comprehensive Assessment (HX, ROS, Risk Assessments, Wounds Hx, etc.) 1 25 ASSESSMENTS - Wound and Skin Assessment / Reassessment []  - Dermatologic / Skin Assessment (not related to wound area) 0 ASSESSMENTS - Ostomy and/or Continence Assessment and Care []  - Incontinence Assessment and Management 0 []  - Ostomy Care Assessment and Management (repouching, etc.) 0 PROCESS - Coordination of Care X - Simple Patient / Family Education for ongoing care 1 15 []  - Complex (extensive) Patient / Family Education for ongoing care 0 X - Staff obtains Chiropractor, Records, Test Results / Process Orders 1 10 []  - Staff telephones HHA, Nursing Homes / Clarify orders / etc 0 []  - Routine Transfer to another Facility (non-emergent condition) 0 []  - Routine Hospital Admission (non-emergent  condition) 0 X - New Admissions / Manufacturing engineer / Ordering NPWT, Apligraf, etc. 1 15 []  - Emergency Hospital Admission (emergent condition) 0 PROCESS - Special Needs []  - Pediatric / Minor Patient Management 0 []  - Isolation Patient Management 0 []  - Hearing / Language / Visual special needs 0 []  - Assessment of Community assistance (transportation, D/C planning, etc.) 0 []  - Additional assistance / Altered mentation  0 []  - Support Surface(s) Assessment (bed, cushion, seat, etc.) 0 INTERVENTIONS - Miscellaneous []  - External ear exam 0 []  - Patient Transfer (multiple staff / / Similar devices) 0 []  - Simple Staple / Suture removal (25 or less) 0 []  - Complex Staple / Suture removal (26 or more) 0 []  - Hypo/Hyperglycemic Management (do not check if billed separately) 0 X - Ankle / Brachial Index (ABI) - do not check if billed separately 1 15 Has the patient been seen at the hospital within the last three years: Yes Total Score: 100 Level Of Care: New/Established - Level 3 Electronic Signature(s) Signed: 01/29/2019 6:18:44 PM By: RN, BSN Entered By: Nurse, adult on 01/28/2019 18:51:25 -------------------------------------------------------------------------------- Encounter Discharge Information Details Patient Name: Date of Service: . 01/28/2019 1:15 PM Medical Record 03/29/2019 Patient Account Number: [name] Date of Birth/Sex: Treating RN: May 11, 1955 (64 y.o. Female) [name] Primary Care Sayvon Arterberry: 03/28/2019 Other Clinician: Referring Kutter Schnepf: Treating Gilda Abboud/Extender:Robson, [id number], [facility] Weeks in Treatment: 0 Encounter Discharge Information Items Post Procedure Vitals Discharge Condition: Stable Temperature (F): 98 Ambulatory Status: Crutches Pulse (bpm): 73 Discharge Destination: Home Respiratory Rate (breaths/min): 18 Transportation: Private Auto Blood Pressure (mmHg):  172/77 Accompanied By: self Schedule Follow-up Appointment: Yes Clinical Summary of Care: Patient Declined Electronic Signature(s) Signed: 01/29/2019 6:12:59 PM By: (62 RN, BSN Entered By: Zenaida Deed on 01/28/2019 15:54:38 -------------------------------------------------------------------------------- Lower Extremity Assessment Details Patient Name: Date of Service: [facility] 01/28/2019 1:15 PM Medical Record 03/29/2019 Patient Account Number: Zenaida Deed Date of Birth/Sex: Treating RN: 1955/09/15 (63 y.o. Female) Epps, Carrie Primary Care Torian Thoennes: [facility] Other Clinician: Referring Richrd Kuzniar: Treating Jaidalyn Schillo/Extender:Robson, 03/28/2019, [facility] Weeks in Treatment: 0 Edema Assessment Assessed: [Left: No] [Right: No] Edema: [Left: Yes] [Right: Yes] Calf Left: Right: Point of Measurement: 43 cm From Medial Instep 38 cm 39 cm Ankle Left: Right: Point of Measurement: 12 cm From Medial Instep 27 cm 29 cm Vascular Assessment Blood Pressure: Brachial: [Left:172] [Right:172] Ankle: [Left:Dorsalis Pedis: 148 0.86] [Right:Dorsalis Pedis: 150 0.87] Electronic Signature(s) Signed: 01/28/2019 6:08:38 PM By: 11/03/1955 RN Entered By: 08-27-1999 on 01/28/2019 14:20:35 -------------------------------------------------------------------------------- Multi Wound Chart Details Patient Name: Date of Service: [name]. 01/28/2019 1:15 PM Medical Record 03/28/2019 Patient Account Number: Yevonne Pax Date of Birth/Sex: Treating RN: [birth year] (63 y.o. Female) [name] Primary Care Rajvir Ernster: 03/28/2019 Other Clinician: Referring Keiarra Charon: Treating Lowell Makara/Extender:Robson, IRWERX:[id number], [facility] Weeks in Treatment: 0 Vital Signs Height(in): 68 Capillary Blood 135 Glucose(mg/dl): Weight(lbs): 11/03/1955 Pulse(bpm): 73 Body Mass Index(BMI): 40 Blood Pressure(mmHg): 172/77 Temperature(F):  98 Respiratory 18 Rate(breaths/min): Photos: [10:No Photos] [11:No Photos] [9:No Photos] Wound Location: [10:Left Foot - Plantar, Lateral Right Toe Great - Anterior Right Foot - Medial] Wounding Event: [10:Blister] [11:Blister] [9:Blister] 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] [11:Lower Extremity] [9:Lower Extremity] Comorbid History: [10:Glaucoma, Chronic sinus Glaucoma, Chronic sinus Glaucoma, Chronic sinus problems/congestion, Anemia, Sleep Apnea, Hypertension, Type II Diabetes, Rheumatoid Arthritis, Neuropathy] [11:problems/congestion, Anemia, Sleep Apnea,  Hypertension, Type II Diabetes, Rheumatoid Arthritis, Neuropathy] [9:problems/congestion, Anemia, Sleep Apnea, Hypertension, Type II Diabetes, Rheumatoid Arthritis, Neuropathy] Date Acquired: [10:11/18/2018] [11:01/18/2019] [9:06/18/2018] Weeks of Treatment: [10:0] [11:0] [9:0] Wound Status: [10:Open] [11:Open] [9:Open] Measurements L x W x D 1.6x1.4x1.5 [11:0.8x1.5x0.1] [9:4x3.5x0.9] (cm) Area (cm) : [10:1.759] [11:0.942] [9:10.996] Volume (cm) : [10:2.639] [11:0.094] [9:9.896] % Reduction in Area: [10:0.00%] [11:N/A] [9:N/A] % Reduction in Volume: 0.00% [11:N/A] [9:N/A] Starting Position 1 12 [9:11] (o'clock): Ending Position 1 [10:12] [  9:6] (o'clock): Maximum Distance 1 [10:1.1] [9:0.7] (cm): Undermining: [10:Yes] [11:No] [9:Yes] Classification: [10:Grade 2] [11:Grade 2] [9:Grade 2] Exudate Amount: [10:Medium] [11:Small] [9:Medium] Exudate Type: [10:Serosanguineous] [11:Serosanguineous] [9:Serosanguineous] Exudate Color: [10:red, brown] [11:red, brown] [9:red, brown] Wound Margin: [10:N/A] [11:N/A] [9:Flat and Intact] Granulation Amount: [10:Small (1-33%)] [11:Small (1-33%)] [9:Large (67-100%)] Granulation Quality: [10:Red, Pink] [11:Pink] [9:Red, Pink] Necrotic Amount: [10:Large (67-100%)] [11:Large (67-100%)] [9:Small (1-33%)] Necrotic  Tissue: [10:Eschar, Adherent Slough Adherent Slough] [9:Adherent Slough] Exposed Structures: [10:Fat Layer (Subcutaneous Tissue) Exposed: Yes Muscle: Yes Bone: Yes Fascia: No Tendon: No Joint: No] [11:Fat Layer (Subcutaneous Tissue) Exposed: Yes Fascia: No Tendon: No Muscle: No Joint: No Bone: No] [9:Fat Layer (Subcutaneous Tissue) Exposed: Yes  Fascia: No Tendon: No Muscle: No Joint: No Bone: No] Epithelialization: [10:None] [11:None] [9:N/A] Debridement: [10:Debridement - Excisional] [11:N/A] [9:Debridement - Excisional] Pre-procedure [10:15:12] [11:N/A] [9:15:12] Verification/Time Out Taken: Tissue Debrided: [10:Muscle, Callus, Subcutaneous, Slough] [11:N/A] [9:Callus, Subcutaneous, Slough] Level: [10:Skin/Subcutaneous Tissue/Muscle] [11:N/A] [9:Skin/Subcutaneous Tissue] Debridement Area (sq cm):2.24 [11:N/A] [9:14] Instrument: [10:Blade, Curette, Forceps] [11:N/A] [9:Curette] Bleeding: [10:Moderate] [11:N/A] [9:Moderate] Hemostasis Achieved: [10:Silver Nitrate] [11:N/A] [9:Pressure] Procedural Pain: [10:0] [11:N/A] [9:0] Post Procedural Pain: [10:0] [11:N/A] [9:0] Debridement Treatment Procedure was tolerated [11:N/A] [9:Procedure was tolerated] Response: [10:well] [9:well] Post Debridement [10:1.6x1.4x1.5] [11:N/A] [9:4x3.5x0.9] Measurements L x W x D (cm) Post Debridement [10:2.639] [11:N/A] [9:9.896] Volume: (cm) Procedures Performed: Debridement [11:N/A] [9:Debridement] Treatment Notes Electronic Signature(s) Signed: 01/28/2019 6:04:26 PM By: Linton Ham MD Signed: 01/29/2019 6:18:44 PM By: Levan Hurst RN, BSN Entered By: Linton Ham on 01/28/2019 15:51:34 -------------------------------------------------------------------------------- Multi-Disciplinary Care Plan Details Patient Name: Date of Service: [facility] 01/28/2019 1:15 PM Medical Record [id number] Patient Account Number: [id number] Date of Birth/Sex: Treating RN: 09-22-1955 (64 y.o.  Female) Levan Hurst Primary Care Bellah Alia: Dewayne Hatch Other Clinician: Referring Lamekia Nolden: Treating Tatiyanna Lashley/Extender:Robson, [name], [facility] Weeks in Treatment: 0 Active Inactive Abuse / Safety / Falls / Self Care Management Nursing Diagnoses: Potential for falls Potential for injury related to falls Goals: Patient will not experience any injury related to falls Date Initiated: 01/28/2019 Target Resolution Date: 03/01/2019 Goal Status: Active Patient/caregiver will verbalize/demonstrate measures taken to improve the patient's personal safety Date Initiated: 01/28/2019 Target Resolution Date: 03/01/2019 Goal Status: Active Patient/caregiver will verbalize/demonstrate measures taken to prevent injury and/or falls Date Initiated: 01/28/2019 Target Resolution Date: 03/01/2019 Goal Status: Active Interventions: Assess Activities of Daily Living upon admission and as needed Assess fall risk on admission and as needed Assess: immobility, friction, shearing, incontinence upon admission and as needed Assess impairment of mobility on admission and as needed per policy Assess personal safety and home safety (as indicated) on admission and as needed Assess self care needs on admission and as needed Provide education on fall prevention Provide education on personal and home safety Notes: Nutrition Nursing Diagnoses: Impaired glucose control: actual or potential Potential for alteratiion in Nutrition/Potential for imbalanced nutrition Goals: Patient/caregiver agrees to and verbalizes understanding of need to use nutritional supplements and/or vitamins as prescribed Date Initiated: 01/28/2019 Target Resolution Date: 03/01/2019 Goal Status: Active Patient/caregiver verbalizes understanding of need to maintain therapeutic glucose control per primary care physician Date Initiated: 01/28/2019 Target Resolution Date: 03/01/2019 Goal Status: Active Interventions: Assess  HgA1c results as ordered upon admission and as needed Assess patient nutrition upon admission and as needed per policy Provide education on elevated blood sugars and impact on wound healing Provide education on nutrition Notes: Wound/Skin Impairment Nursing Diagnoses: Impaired tissue integrity Knowledge deficit related to ulceration/compromised skin integrity Goals: Patient/caregiver will verbalize understanding of skin care regimen  Date Initiated: 01/28/2019 Target Resolution Date: 03/01/2019 Goal Status: Active Interventions: Assess patient/caregiver ability to obtain necessary supplies Assess patient/caregiver ability to perform ulcer/skin care regimen upon admission and as needed Assess ulceration(s) every visit Provide education on ulcer and skin care Notes: Electronic Signature(s) Signed: 01/29/2019 6:18:44 PM By: Sarah Abts RN, BSN Entered By: [name] on 01/28/2019 18:50:36 -------------------------------------------------------------------------------- Pain Assessment Details Patient Name: Date of Service: [name] 01/28/2019 1:15 PM Medical Record [id number] Patient Account Number: [id number] Date of Birth/Sex: Treating RN: 1955/04/20 (64 y.o. Female) Yevonne Pax Primary Care Shamara Soza: Chevis Pretty Other Clinician: Referring Karem Farha: Treating Savina Olshefski/Extender:Robson, [name], [facility] Weeks in Treatment: 0 Active Problems Location of Pain Severity and Description of Pain Patient Has Paino No Site Locations Pain Management and Medication Current Pain Management: Electronic Signature(s) Signed: 01/28/2019 6:08:38 PM By: Yevonne Pax RN Entered By: Yevonne Pax on 01/28/2019 14:28:28 -------------------------------------------------------------------------------- Patient/Caregiver Education Details Patient Name: [name] 1/11/2021andnbsp1:15 Date of Service: PM Medical Record [id number] Number: Patient  Account Number: [id number] Treating RN: Date of Birth/Gender: [birth year] (63 y.o. [name] Female) Other Clinician: Primary Care Treating MASOUDI, Minus Breeding Physician: Physician/Extender: Referring Physician: Rozetta Nunnery in Treatment: 0 Education Assessment Education Provided To: Patient Education Topics Provided Elevated Blood Sugar/ Impact on Healing: Methods: Explain/Verbal Responses: State content correctly Nutrition: Methods: Explain/Verbal Responses: State content correctly Safety: Methods: Explain/Verbal Responses: State content correctly Wound/Skin Impairment: Methods: Explain/Verbal Responses: State content correctly Electronic Signature(s) Signed: 01/29/2019 6:18:44 PM By: Sarah Abts RN, BSN Entered By: [name] on 01/28/2019 18:50:57 -------------------------------------------------------------------------------- Wound Assessment Details Patient Name: Date of Service: [name]. 01/28/2019 1:15 PM Medical Record [id number] Patient Account Number: [id number] Date of Birth/Sex: Treating RN: 1955/01/28 (64 y.o. Female) Epps, Carrie Primary Care Shatoria Stooksbury: Chevis Pretty Other Clinician: Referring Mella Inclan: Treating Alanah Sakuma/Extender:Robson, [name], [facility] Weeks in Treatment: 0 Wound Status Wound Number: 10 Primary Diabetic Wound/Ulcer of the Lower Extremity Etiology: Wound Location: Left Foot - Plantar, Lateral Wound Open Wounding Event: Blister Status: Date Acquired: 11/18/2018 Comorbid Glaucoma, Chronic sinus Weeks Of Treatment: 0 History: problems/congestion, Anemia, Sleep Apnea, Clustered Wound: No Hypertension, Type II Diabetes, Rheumatoid Arthritis, Neuropathy Photos Wound Measurements Length: (cm) 1.6 Width: (cm) 1.4 Depth: (cm) 1.5 Area: (cm) 1.759 Volume: (cm) 2.639 % Reduction in Area: 0% % Reduction in Volume: 0% Epithelialization: None Tunneling:  No Undermining: Yes Starting Position (o'clock): 12 Ending Position (o'clock): 12 Maximum Distance: (cm) 1.1 Wound Description Classification: Grade 2 Foul Odor Aft Exudate Amount: Medium Slough/Fibrin Exudate Type: Serosanguineous Exudate Color: red, brown Wound Bed Granulation Amount: Small (1-33%) Granulation Quality: Red, Pink Fascia Expose Necrotic Amount: Large (67-100%) Fat Layer (Su Necrotic Quality: Eschar, Adherent Slough Tendon Expose Muscle Expose Necrosis Joint Exposed Bone Exposed: er Cleansing: No o Yes Exposed Structure d: No bcutaneous Tissue) Exposed: Yes d: No d: Yes of Muscle: No : No Yes Treatment Notes Wound #10 (Left, Lateral, Plantar Foot) 3. Primary Dressing Applied Calcium Alginate Ag 4. Secondary Dressing Dry Gauze Roll Gauze Foam Electronic Signature(s) Signed: 01/31/2019 3:33:56 PM By: Benjaman Kindler EMT/HBOT Signed: 01/31/2019 6:24:40 PM By: Yevonne Pax RN Previous Signature: 01/28/2019 6:08:38 PM Version By: Yevonne Pax RN Previous Signature: 01/29/2019 6:18:44 PM Version By: Sarah Abts RN, BSN Entered By: Benjaman Kindler on 01/31/2019 11:41:13 -------------------------------------------------------------------------------- Wound Assessment Details Patient Name: Date of Service: [facility] 01/28/2019 1:15 PM Medical Record [id number] Patient Account Number: [id number] Date of Birth/Sex: Treating RN: [birth year] (64 y.o. Female) Yevonne Pax Primary Care Marrietta Thunder: Chevis Pretty Other Clinician:  Referring Ranferi Clingan: Treating Burlin Mcnair/Extender:Robson, [name], [facility] Weeks in Treatment: 0 Wound Status Wound Number: 11 Primary Diabetic Wound/Ulcer of the Lower Extremity Etiology: Wound Location: Right Toe Great - Medial Wound Open Wounding Event: Blister Status: Date Acquired: 01/18/2019 Comorbid Glaucoma, Chronic sinus Weeks Of Treatment: 0 History: problems/congestion, Anemia, Sleep  Apnea, Clustered Wound: No Hypertension, Type II Diabetes, Rheumatoid Arthritis, Neuropathy Photos Wound Measurements Length: (cm) 0.8 Width: (cm) 1.5 Depth: (cm) 0.1 Area: (cm) 0.942 Volume: (cm) 0.094 Wound Description Classification: Grade 2 Exudate Amount: Small Exudate Type: Serosanguineous Exudate Color: red, brown Wound Bed Granulation Amount: Small (1-33%) Granulation Quality: Pink Necrotic Amount: Large (67-100%) Necrotic Quality: Adherent Slough After Cleansing: No brino Yes Exposed Structure osed: No (Subcutaneous Tissue) Exposed: Yes posed: No posed: No osed: No sed: No % Reduction in Area: 0% % Reduction in Volume: 0% Epithelialization: None Tunneling: No Undermining: No Foul Odor Slough/Fi Fascia Exp Fat Layer Tendon Ex Muscle Ex Joint Exp Bone Expo Treatment Notes Wound #11 (Right, Medial Toe Great) 3. Primary Dressing Applied Calcium Alginate Ag 4. Secondary Dressing Dry Gauze Roll Gauze 7. Footwear/Offloading device applied Removable [employer] planner) Signed: 01/31/2019 3:33:56 PM By: Benjaman Kindler EMT/HBOT Signed: 01/31/2019 6:24:40 PM By: Yevonne Pax RN Previous Signature: 01/28/2019 6:08:38 PM Version By: Yevonne Pax RN Entered By: Benjaman Kindler on 01/31/2019 11:41:37 -------------------------------------------------------------------------------- Wound Assessment Details Patient Name: Date of Service: [facility] 01/28/2019 1:15 PM Medical Record [id number] Patient Account Number: [id number] Date of Birth/Sex: Treating RN: [birth year] (64 y.o. Female) Epps, Carrie Primary Care Lariya Kinzie: Chevis Pretty Other Clinician: Referring Irelyn Perfecto: Treating Stefannie Defeo/Extender:Robson, [name], [facility] Weeks in Treatment: 0 Wound Status Wound Number: 9 Primary Diabetic Wound/Ulcer of the Lower Extremity Etiology: Wound Location: Right Foot - Medial Wound Open Wounding  Event: Blister Status: Date Acquired: 06/18/2018 Comorbid Glaucoma, Chronic sinus Weeks Of Treatment: 0 History: problems/congestion, Anemia, Sleep Apnea, Clustered Wound: No Hypertension, Type II Diabetes, Rheumatoid Arthritis, Neuropathy Photos Wound Measurements Length: (cm) 4 Width: (cm) 3.5 Depth: (cm) 0.9 Area: (cm) 10.996 Volume: (cm) 9.896 % Reduction in Area: 0% % Reduction in Volume: 0% Tunneling: No Undermining: Yes Starting Position (o'clock): 11 Ending Position (o'clock): 6 Maximum Distance: (cm) 0.7 Wound Description Classification: Grade 2 Wound Margin: Flat and Intact Exudate Amount: Medium Exudate Type: Serosanguineous Exudate Color: red, brown Wound Bed Granulation Amount: Large (67-100%) Granulation Quality: Red, Pink Necrotic Amount: Small (1-33%) Necrotic Quality: Adherent Slough Foul Odor After Cleansing: No Slough/Fibrino Yes Exposed Structure Fascia Exposed: No Fat Layer (Subcutaneous Tissue) Exposed: Yes Tendon Exposed: No Muscle Exposed: No Joint Exposed: No Bone Exposed: No Treatment Notes Wound #9 (Right, Medial Foot) 3. Primary Dressing Applied Calcium Alginate Ag 4. Secondary Dressing Dry Gauze Roll Gauze Foam Electronic Signature(s) Signed: 01/31/2019 3:33:56 PM By: Benjaman Kindler EMT/HBOT Signed: 01/31/2019 6:24:40 PM By: Yevonne Pax RN Previous Signature: 01/28/2019 6:08:38 PM Version By: Yevonne Pax RN Entered By: Benjaman Kindler on 01/31/2019 11:31:26 -------------------------------------------------------------------------------- Vitals Details Patient Name: Date of Service: [name]. 01/28/2019 1:15 PM Medical Record [id number] Patient Account Number: [id number] Date of Birth/Sex: Treating RN: 1955-10-13 (63 y.o. Female) Epps, Carrie Primary Care Shatana Saxton: Chevis Pretty Other Clinician: Referring Flossie Wexler: Treating Trystyn Sitts/Extender:Robson, [name], [facility] Weeks in Treatment:  0 Vital Signs Time Taken: 13:48 Temperature (F): 98 Height (in): 68 Pulse (bpm): 73 Source: Stated Respiratory Rate (breaths/min): 18 Weight (lbs): 265 Blood Pressure (mmHg): 172/77 Source: Stated Capillary Blood Glucose (mg/dl): 893 Body Mass Index (BMI): 40.3 Reference Range: 80 - 120 mg /  dl Notes CBG per patient Electronic Signature(s) Signed: 01/28/2019 6:08:38 PM By: Yevonne Pax RN Entered By: Yevonne Pax on 01/28/2019 13:49:16

## 2019-01-29 NOTE — Progress Notes (Signed)
Sarah, [name] ([id number]) Visit Report for 01/28/2019 Chief Complaint Document Details Patient Name: Date of Service: Sarah, [name] 01/28/2019 1:15 PM Medical Record [id number] Patient Account Number: [id number] Date of Birth/Sex: Treating RN: Aug 01, 1955 (64 y.o. Female) Zandra Abts Primary Care Provider: Chevis Pretty Other Clinician: Referring Provider: Treating Provider/Extender:Demetrie Borge, [name], [facility] Weeks in Treatment: 0 Information Obtained from: Patient Chief Complaint Patients presents for follow up of a diabetic ulcer 01/28/2019; patient is here for review of diabetic foot ulcers bilaterally Electronic Signature(s) Signed: 01/28/2019 6:04:26 PM By: Baltazar Najjar MD Entered By: Baltazar Najjar on 01/28/2019 17:33:40 -------------------------------------------------------------------------------- Debridement Details Patient Name: Date of Service: [name]. 01/28/2019 1:15 PM Medical Record [id number] Patient Account Number: [id number] Date of Birth/Sex: [birth year] (63 y.o. Female) Treating RN: Zandra Abts Primary Care Provider: Chevis Pretty Other Clinician: Referring Provider: Chevis Pretty Treating Provider/Extender:Clorissa Gruenberg, Lamar Sprinkles in Treatment: 0 Debridement Performed for Wound #10 Left,Lateral,Plantar Foot Assessment: Performed By: Physician [name], MD Debridement Type: Debridement Severity of Tissue Pre Bone involvement without necrosis Debridement: Level of Consciousness (Pre- Awake and Alert procedure): Pre-procedure Verification/Time Out Taken: Yes - 15:12 Start Time: 15:12 Total Area Debrided (L x W): 1.6 (cm) x 1.4 (cm) = 2.24 (cm) Tissue and other material Viable, Non-Viable, Callus, Muscle, Slough, Subcutaneous, Slough debrided: Level: Skin/Subcutaneous Tissue/Muscle Debridement Description: Excisional Instrument: Blade, Curette, Forceps Bleeding:  Moderate Hemostasis Achieved: Silver Nitrate End Time: 15:14 Procedural Pain: 0 Post Procedural Pain: 0 Response to Treatment: Procedure was tolerated well Level of Consciousness Awake and Alert (Post-procedure): Post Debridement Measurements of Total Wound Length: (cm) 1.6 Width: (cm) 1.4 Depth: (cm) 1.5 Volume: (cm) 2.639 Character of Wound/Ulcer Post Stable Debridement: Bone involvement without Severity of Tissue Post Debridement: necrosis Post Procedure Diagnosis Same as Pre-procedure Electronic Signature(s) Signed: 01/28/2019 6:04:26 PM By: Baltazar Najjar MD Signed: 01/29/2019 6:18:44 PM By: Zandra Abts RN, BSN Entered By: Baltazar Najjar on 01/28/2019 15:51:52 -------------------------------------------------------------------------------- Debridement Details Patient Name: Date of Service: [name]. 01/28/2019 1:15 PM Medical Record [id number] Patient Account Number: [id number] Date of Birth/Sex: Apr 03, 1955 (63 y.o. Female) Treating RN: Zandra Abts Primary Care Provider: Chevis Pretty Other Clinician: Referring Provider: Chevis Pretty Treating Provider/Extender:Alisia Vanengen, Lamar Sprinkles in Treatment: 0 Debridement Performed for Wound #9 Right,Medial Foot Assessment: Performed By: Physician [name], MD Debridement Type: Debridement Severity of Tissue Pre Fat layer exposed Debridement: Level of Consciousness (Pre- Awake and Alert procedure): Pre-procedure Verification/Time Out Taken: Yes - 15:12 Start Time: 15:17 Total Area Debrided (L x W): 4 (cm) x 3.5 (cm) = 14 (cm) Tissue and other material Viable, Non-Viable, Callus, Slough, Subcutaneous, Skin: Epidermis, Slough Viable, Non-Viable, Callus, Slough, Subcutaneous, Skin: Epidermis, Slough debrided: Level: Skin/Subcutaneous Tissue Debridement Description: Excisional Instrument: Curette Bleeding: Moderate Hemostasis Achieved: Pressure End Time: 15:19 Procedural  Pain: 0 Post Procedural Pain: 0 Response to Treatment: Procedure was tolerated well Level of Consciousness Awake and Alert (Post-procedure): Post Debridement Measurements of Total Wound Length: (cm) 4 Width: (cm) 3.5 Depth: (cm) 0.9 Volume: (cm) 9.896 Character of Wound/Ulcer Post Stable Debridement: Severity of Tissue Post Debridement: Fat layer exposed Post Procedure Diagnosis Same as Pre-procedure Electronic Signature(s) Signed: 01/28/2019 6:04:26 PM By: Baltazar Najjar MD Signed: 01/29/2019 6:18:44 PM By: Zandra Abts RN, BSN Entered By: Baltazar Najjar on 01/28/2019 15:52:11 -------------------------------------------------------------------------------- HPI Details Patient Name: Date of Service: [name]. 01/28/2019 1:15 PM Medical Record [id number] Patient Account Number: [id number] Date of Birth/Sex: Treating RN: 1955/10/05 (64 y.o. Female) Zandra Abts Primary Care Provider: Chevis Pretty Other Clinician: Referring  Provider: Treating Provider/Extender:Tailor Lucking, [name], [facility] Weeks in Treatment: 0 History of Present Illness Location: left plantar foot Quality: Patient reports No Pain. Duration: 2 months Modifying Factors: d.m. HPI Description: 64 yrs old bf here for follow up recurrent left charcot foot ulcer. She has DM. She is not a smoker. She states a blister developed 3 months ago at site of previous breakdown from 1 year ago. It was debrided at the podiatrist's office. She went to the ER and then sent here. She denies pain. Xray was negative for osteo. Culture from this clinic negative. 09/08/14 Referred by [name] to [name] for Charcot foot. Seen by him and MRI scheduled 09/19/14. Prior silver collagen but this was not ordered last visit, patient has been rinsing with saline alone. Re institute silver collagen every other day. F/u 2 weeks with [name] as Labor Day holiday. Supplies ordered. 09/25/14; this is a  patient with a Charcot foot on the left. she has a wound over the plantar aspect of her left calcaneus. She has been seen by [name] of orthopedic surgery and has had an MRI. She is apparently going within the next week or 2 for corrective surgery which will involve an external fixator. I don't have any of the details of this. 10/06/14; The patient is a 64 yrs old bf with a left Charcot foot and ulcer on the plantar. 12/01/14 Returns post surgery with [name]. Has follow up visit later this week with him, currently in facility and NWB over this extremity. States no drainage from foot and no current wound care. On exam callus and scab in place, suspect healed. Has external fixator in place. Will defer to [name] for management, ok to place moisturizing cream over scabs and callus and she will call if she requires follow up here. READMISSION 01/28/2019 Patient was in the clinic in 2016 for a wound on the plantar aspect of her left calcaneus. Predominantly cared for by [name] she was referred to [name] and had corrective surgery for the position of her left ankle I believe. She had previously been here in 2015 and then prior to that in 2011 2012 that I do not have these records. More recently she has developed fairly extensive wounds on the right medial foot, left lateral foot and a small area on the right medial great toe. Right medial foot wound has been there for 6 to 7 months, left foot wound for 2 to 3 months and the right great toe only over the last few weeks. She has been followed by [name] at Dr. Laverta Baltimore office it sounds as though she has been undergoing callus paring and silver nitrate. The patient has been washing these off with soap and water and plan applying dry dressing Past medical history, type 2 diabetes well controlled with a recent hemoglobin A1c of 7 on 11/16, type 2 diabetes with peripheral neuropathy, previous left Charcot joint surgery bilateral  Charcot joints currently, stage III chronic renal failure, hypertension, obstructive sleep apnea, history of MRSA and gastroesophageal reflux. Electronic Signature(s) Signed: 01/28/2019 6:04:26 PM By: Baltazar Najjar MD Entered By: Baltazar Najjar on 01/28/2019 15:55:11 -------------------------------------------------------------------------------- Physical Exam Details Patient Name: Date of Service: [name] 01/28/2019 1:15 PM Medical Record [id number] Patient Account Number: [id number] Date of Birth/Sex: Treating RN: July 29, 1955 (63 y.o. Female) Zandra Abts Primary Care Provider: Chevis Pretty Other Clinician: Referring Provider: Treating Provider/Extender:Illianna Paschal, [name], [facility] Weeks in Treatment: 0 Constitutional Patient is hypertensive.. Pulse regular and  within target range for patient.Marland Kitchen Respirations regular, non-labored and within target range.. Temperature is normal and within the target range for the patient.Marland Kitchen Appears in no distress. Respiratory work of breathing is normal. Cardiovascular Femoral pulses palpable. Pedal pulses palpable. Lymphatic None palpable in the popliteal area. Psychiatric appears at normal baseline. Notes Wound exam On the left lateral midfoot a fairly extensive wound that probes to bone. Wide area of circumferential callus and subcutaneous tissue removed with pickups and a #10 scalpel. Wound bed necrotic surface debrided with a #5 curette. There is no purulence nothing that really looks like it would need culture On the left medial foot large senescent looking wound again probing to bone in the center part where there is a crevice. Again debridement of nonviable circumference gritty fibrinous material over the wound surface. Small area over the right medial great toe this was not debrided. Electronic Signature(s) Signed: 01/28/2019 6:04:26 PM By: Baltazar Najjar MD Entered By: Baltazar Najjar on 01/28/2019  15:57:12 -------------------------------------------------------------------------------- Physician Orders Details Patient Name: Date of Service: [facility] 01/28/2019 1:15 PM Medical Record [id number] Patient Account Number: [id number] Date of Birth/Sex: Treating RN: 1955-04-27 (63 y.o. Female) Zandra Abts Primary Care Provider: Chevis Pretty Other Clinician: Referring Provider: Treating Provider/Extender:Unika Nazareno, [name], [facility] Weeks in Treatment: 0 Verbal / Phone Orders: No Diagnosis Coding Follow-up Appointments Return Appointment in 1 week. Dressing Change Frequency Wound #10 Left,Lateral,Plantar Foot Change dressing every day. Wound #11 Right,Medial Toe Great Change dressing every day. Wound #9 Right,Medial Foot Change dressing every day. Wound Cleansing May shower and wash wound with soap and water. - on days that dressing is changed Primary Wound Dressing Wound #10 Left,Lateral,Plantar Foot Calcium Alginate with Silver Wound #11 Right,Medial Toe Great Calcium Alginate with Silver Wound #9 Right,Medial Foot Calcium Alginate with Silver Secondary Dressing Wound #10 Left,Lateral,Plantar Foot Kerlix/Rolled Gauze Dry Gauze Other: - foam donut Wound #9 Right,Medial Foot Kerlix/Rolled Gauze Dry Gauze Other: - foam donut Off-Loading Removable cast walker boot to: - right foot Radiology X-ray, right foot, complete view - Non healing ulcer on medial foot and great toe. ICD 10: E11.621 X-ray, left foot, complete view - Non healing ulcer on plantar foot. ICD 10: E11.621 Electronic Signature(s) Signed: 01/28/2019 6:04:26 PM By: Baltazar Najjar MD Signed: 01/29/2019 6:18:44 PM By: Zandra Abts RN, BSN Entered By: Zandra Abts on 01/28/2019 15:24:23 -------------------------------------------------------------------------------- Prescription 01/28/2019 Patient Name: [name] Provider: Baltazar Najjar MD Date of Birth:  August 11, 1955 NPI#: [id number] Sex: Female DEA#: [id number] Phone #: [phone] License #: [id number] Patient Address: Eligha Bridegroom [facility] [address] [place], [place] [address] [address] [place], [place] [address] [phone] Allergies codeine Reaction: swelling in throat and itching Severity: Severe pravastatin Reaction: cramps, intolerence Severity: Severe Provider's Orders X-ray, right foot, complete view - Non healing ulcer on medial foot and great toe. ICD 10: E11.621 Signature(s): Date(s): Prescription 01/28/2019 Patient Name: [facility] Provider: Baltazar Najjar MD Date of Birth: 1955-04-07 NPI#: [id number] Sex: Female DEA#: [id number] Phone #: [phone] License #: [id number] Patient Address: Eligha Bridegroom [facility] [address] [place], [place] [address] [address] [place], [place] [address] [phone] Allergies codeine Reaction: swelling in throat and itching Severity: Severe pravastatin Reaction: cramps, intolerence Severity: Severe Provider's Orders X-ray, left foot, complete view - Non healing ulcer on plantar foot. ICD 10: E11.621 Signature(s): Date(s): Electronic Signature(s) Signed: 01/28/2019 6:04:26 PM By: Baltazar Najjar MD Signed: 01/29/2019  6:18:44 PM By: Zandra Abts RN, BSN Entered By: Zandra Abts on 01/28/2019 15:24:23 --------------------------------------------------------------------------------  Problem List Details Patient Name: Date of Service: [facility] 01/28/2019 1:15 PM Medical Record [id number] Patient Account Number: [id number] Date of Birth/Sex: Treating RN: [birth year] (63 y.o. 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 L97.511 Non-pressure chronic ulcer of other part of right foot 01/28/2019 No Yes limited to breakdown of skin L97.514 Non-pressure chronic ulcer of other part of right foot 01/28/2019 No Yes with necrosis of bone E11.42 Type 2 diabetes mellitus with diabetic polyneuropathy 01/28/2019 No Yes M14.671 Charcot's joint, right ankle and foot 01/28/2019 No Yes M14.672 Charcot's joint, left ankle and foot 01/28/2019 No Yes Inactive Problems Resolved Problems Electronic Signature(s) Signed: 01/28/2019 6:04:26 PM By: Baltazar Najjar MD Entered By: Baltazar Najjar on 01/28/2019 15:51:05 -------------------------------------------------------------------------------- Progress Note Details Patient Name: Date of Service: [name]. 01/28/2019 1:15 PM Medical Record [id number] Patient Account Number: [id number] Date of Birth/Sex: Treating RN: 12-26-1955 (65 y.o. Female) Zandra Abts Primary Care Provider: Chevis Pretty Other Clinician: Referring Provider: Treating Provider/Extender:Neesa Knapik, [name], [facility] Weeks in Treatment: 0 Subjective Chief Complaint Information obtained from Patient Patients presents for follow up of a diabetic ulcer 01/28/2019; patient is here for review of diabetic foot ulcers bilaterally History of Present Illness (HPI) The following HPI elements were documented for the patient's wound: Location: left plantar foot Quality: Patient reports No Pain. Duration: 2 months Modifying Factors: d.m. 64 yrs old bf here for follow up recurrent left charcot foot ulcer. She has DM. She is not a smoker. She states a blister developed 3 months ago at site of previous breakdown from 1 year ago. It was debrided at the  podiatrist's office. She went to the ER and then sent here. She denies pain. Xray was negative for osteo. Culture from this clinic negative. 09/08/14 Referred by [name] to [name] for Charcot foot. Seen by him and MRI scheduled 09/19/14. Prior silver collagen but this was not ordered last visit, patient has been rinsing with saline alone. Re institute silver collagen every other day. F/u 2 weeks with [name] as Labor Day holiday. Supplies ordered. 09/25/14; this is a patient with a Charcot foot on the left. she has a wound over the plantar aspect of her left calcaneus. She has been seen by [name] of orthopedic surgery and has had an MRI. She is apparently going within the next week or 2 for corrective surgery which will involve an external fixator. I don't have any of the details of this. 10/06/14; The patient is a 65 yrs old bf with a left Charcot foot and ulcer on the plantar. 12/01/14 Returns post surgery with [name]. Has follow up visit later this week with him, currently in facility and NWB over this extremity. States no drainage from foot and no current wound care. On exam callus and scab in place, suspect healed. Has external fixator in place. Will defer to [name] for management, ok to place moisturizing cream over scabs and callus and she will call if she requires follow up here. READMISSION 01/28/2019 Patient was in the clinic in 2016 for a wound on the plantar aspect of  her left calcaneus. Predominantly cared for by Dr. [name] she was referred to [name] and had corrective surgery for the position of her left ankle I believe. She had previously been here in 2015 and then prior to that in 2011 2012 that I do not have these records. More recently she has developed fairly extensive wounds on the right medial foot, left lateral foot and a small area on the right medial great toe. Right medial foot wound has been there for 6 to 7 months, left foot wound for 2 to 3  months and the right great toe only over the last few weeks. She has been followed by [name] at Dr. [name] office it sounds as though she has been undergoing callus paring and silver nitrate. The patient has been washing these off with soap and water and plan applying dry dressing Past medical history, type 2 diabetes well controlled with a recent hemoglobin A1c of 7 on 11/16, type 2 diabetes with peripheral neuropathy, previous left Charcot joint surgery bilateral Charcot joints currently, stage III chronic renal failure, hypertension, obstructive sleep apnea, history of MRSA and gastroesophageal reflux. Patient History Information obtained from Patient. Allergies codeine (Severity: Severe, Reaction: swelling in throat and itching), pravastatin (Severity: Severe, Reaction: cramps, intolerence) Family History Diabetes - Mother, Heart Disease - Father,Mother, Hypertension - Mother, Kidney Disease - Father, Lung Disease - Father, Stroke - Father, Thyroid Problems - Mother, No family history of Cancer, Hereditary Spherocytosis, Seizures, Tuberculosis. Social History Former smoker, Marital Status - Single, Alcohol Use - Never, Drug Use - No History, Caffeine Use - Moderate. Medical History Eyes Patient has history of Glaucoma - both eyes Denies history of Cataracts, Optic Neuritis Ear/Nose/Mouth/Throat Patient has history of Chronic sinus problems/congestion Hematologic/Lymphatic Patient has history of Anemia - iron pills Denies history of Hemophilia, Human Immunodeficiency Virus, Lymphedema, Sickle Cell Disease Respiratory Patient has history of Sleep Apnea - cpap at night Denies history of Aspiration, Asthma, Chronic Obstructive Pulmonary Disease (COPD), Pneumothorax, Tuberculosis Cardiovascular Patient has history of Hypertension Denies history of Angina, Arrhythmia, Congestive Heart Failure, Coronary Artery Disease, Deep Vein Thrombosis, Hypotension, Myocardial Infarction,  Peripheral Arterial Disease, Peripheral Venous Disease, Phlebitis, Vasculitis Gastrointestinal Denies history of Cirrhosis , Colitis, Crohnoos, Hepatitis A, Hepatitis B, Hepatitis C Endocrine Patient has history of Type II Diabetes Immunological Denies history of Lupus Erythematosus, Raynaudoos, Scleroderma Integumentary (Skin) Denies history of History of Burn Musculoskeletal Patient has history of Rheumatoid Arthritis Denies history of Gout, Osteoarthritis, Osteomyelitis Neurologic Patient has history of Neuropathy - feet Denies history of Dementia, Quadriplegia, Paraplegia, Seizure Disorder Oncologic Denies history of Received Chemotherapy, Received Radiation Psychiatric Denies history of Anorexia/bulimia, Confinement Anxiety Hospitalization/Surgery History - Left foot IandD. - Left foot surgery. - Ovary right. - Left ovary and appy. Medical And Surgical History Notes Constitutional Symptoms (General Health) 65 year old female with history of DM type II and ulcer on planter aspect left heel. Gastrointestinal History of GERD Genitourinary kidney disease Review of Systems (ROS) Constitutional Symptoms (General Health) Denies complaints or symptoms of Fatigue, Fever, Chills, Marked Weight Change. Eyes Denies complaints or symptoms of Dry Eyes, Vision Changes, Glasses / Contacts. Ear/Nose/Mouth/Throat Denies complaints or symptoms of Chronic sinus problems or rhinitis. Respiratory Denies complaints or symptoms of Chronic or frequent coughs, Shortness of Breath. Cardiovascular Denies complaints or symptoms of Chest pain. Gastrointestinal Denies complaints or symptoms of Frequent diarrhea, Nausea, Vomiting. Endocrine Denies complaints or symptoms of Heat/cold intolerance. Genitourinary Denies complaints or symptoms of Frequent urination. Neurologic Denies complaints  or symptoms of Numbness/parasthesias. Psychiatric Denies complaints or symptoms of Claustrophobia,  Suicidal. Objective Constitutional Patient is hypertensive.. Pulse regular and within target range for patient.Marland Kitchen Respirations regular, non-labored and within target range.. Temperature is normal and within the target range for the patient.Marland Kitchen Appears in no distress. Vitals Time Taken: 1:48 PM, Height: 68 in, Source: Stated, Weight: 265 lbs, Source: Stated, BMI: 40.3, Temperature: 98 F, Pulse: 73 bpm, Respiratory Rate: 18 breaths/min, Blood Pressure: 172/77 mmHg, Capillary Blood Glucose: 135 mg/dl. General Notes: CBG per patient Respiratory work of breathing is normal. Cardiovascular Femoral pulses palpable. Pedal pulses palpable. Lymphatic None palpable in the popliteal area. Psychiatric appears at normal baseline. General Notes: Wound exam ooOn the left lateral midfoot a fairly extensive wound that probes to bone. Wide area of circumferential callus and subcutaneous tissue removed with pickups and a #10 scalpel. Wound bed necrotic surface debrided with a #5 curette. There is no purulence nothing that really looks like it would need culture ooOn the left medial foot large senescent looking wound again probing to bone in the center part where there is a crevice. Again debridement of nonviable circumference gritty fibrinous material over the wound surface. ooSmall area over the right medial great toe this was not debrided. Integumentary (Hair, Skin) Wound #10 status is Open. Original cause of wound was Blister. The wound is located on the Left,Lateral,Plantar Foot. The wound measures 1.6cm length x 1.4cm width x 1.5cm depth; 1.759cm^2 area and 2.639cm^3 volume. There is bone, muscle, and Fat Layer (Subcutaneous Tissue) Exposed exposed. There is no tunneling noted, however, there is undermining starting at 12:00 and ending at 12:00 with a maximum distance of 1.1cm. There is a medium amount of serosanguineous drainage noted. There is small (1-33%) red, pink granulation within the  wound bed. There is a large (67-100%) amount of necrotic tissue within the wound bed including Eschar and Adherent Slough. Wound #11 status is Open. Original cause of wound was Blister. The wound is located on the [employer]. The wound measures 0.8cm length x 1.5cm width x 0.1cm depth; 0.942cm^2 area and 0.094cm^3 volume. There is Fat Layer (Subcutaneous Tissue) Exposed exposed. There is no tunneling or undermining noted. There is a small amount of serosanguineous drainage noted. There is small (1-33%) pink granulation within the wound bed. There is a large (67-100%) amount of necrotic tissue within the wound bed including Adherent Slough. Wound #9 status is Open. Original cause of wound was Blister. The wound is located on the Right,Medial Foot. The wound measures 4cm length x 3.5cm width x 0.9cm depth; 10.996cm^2 area and 9.896cm^3 volume. There is Fat Layer (Subcutaneous Tissue) Exposed exposed. There is no tunneling noted, however, there is undermining starting at 11:00 and ending at 6:00 with a maximum distance of 0.7cm. There is a medium amount of serosanguineous drainage noted. The wound margin is flat and intact. There is large (67-100%) red, pink granulation within the wound bed. There is a small (1-33%) amount of necrotic tissue within the wound bed including Adherent Slough. 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 There was a Excisional Skin/Subcutaneous Tissue/Muscle Debridement with a total area of 2.24 sq cm performed by [name], MD. With the following instrument(s): Blade, Curette, and Forceps to remove Viable and Non-Viable tissue/material. Material removed includes Muscle, Callus, Subcutaneous Tissue, and Slough. No specimens were taken. A time out was conducted at 15:12, prior to the start of the procedure. A Moderate amount of bleeding was controlled with Silver Nitrate. The procedure was tolerated well with a pain level of 0 throughout and a pain level of 0 following the procedure. Post Debridement Measurements: 1.6cm length x 1.4cm width x 1.5cm depth; 2.639cm^3 volume. Character of Wound/Ulcer Post Debridement is stable. Severity of Tissue Post Debridement is: Bone involvement without necrosis. Post procedure Diagnosis Wound #10: Same as Pre-Procedure Wound #9 Pre-procedure diagnosis of Wound #9 is a Diabetic Wound/Ulcer of the Lower Extremity located on the Right,Medial Foot .Severity of Tissue Pre Debridement is: Fat layer exposed. There was a Excisional Skin/Subcutaneous Tissue Debridement with a total area of 14 sq cm performed by [name], MD. With the following instrument(s): Curette to remove Viable and Non-Viable tissue/material. Material removed includes Callus, Subcutaneous Tissue, Slough, and Skin: Epidermis. No specimens were taken. A time out was conducted at 15:12, prior to the start of the procedure. A Moderate amount of bleeding was controlled with Pressure. The procedure was tolerated well with a pain level of 0 throughout and a pain level of 0 following the procedure. Post Debridement Measurements: 4cm length x 3.5cm width x 0.9cm depth; 9.896cm^3 volume. Character of Wound/Ulcer Post Debridement is stable. Severity of Tissue Post Debridement is: Fat layer exposed. Post procedure Diagnosis Wound  #9: Same as Pre-Procedure Plan Follow-up Appointments: Return Appointment in 1 week. Dressing Change Frequency: Wound #10 Left,Lateral,Plantar Foot: Change dressing every day. Wound #11 Right,Medial Toe Great: Change dressing every day. Wound #9 Right,Medial Foot: Change dressing every day. Wound Cleansing: May shower and wash wound with soap and water. - on days that dressing is changed Primary Wound Dressing: Wound #10 Left,Lateral,Plantar Foot: Calcium Alginate with Silver Wound #11 Right,Medial Toe Great: Calcium Alginate with Silver Wound #9 Right,Medial Foot: Calcium Alginate with Silver Secondary Dressing: Wound #10 Left,Lateral,Plantar Foot: Kerlix/Rolled Gauze Dry Gauze Other: - foam donut Wound #9 Right,Medial Foot: Kerlix/Rolled Gauze Dry Gauze Other: - foam donut Off-Loading: Removable cast walker boot to: - right foot Radiology ordered were: X-ray, right foot, complete view - Non healing ulcer on medial foot and great toe. ICD 10: E11.621, X-ray, left foot, complete view - Non healing ulcer on plantar foot. ICD 10: E11.621 1. We will use silver alginate to all wounds change daily 2. She has a cam walker for the right foot. 3. Plain x-rays of both feet. There are extensive wounds do probe to bone bilaterally. 4. I think the patient walks on the medial part of her right foot. She has had corrective surgery on the left foot. She has crutches I think the further offload this and I have asked her to be fastidious about this. 5. I did not do cultures of either area although I think we could get bone in both areas especially the left. I spent 35 minutes in the research in this patient, face-to-face examination and preparation of this Electronic Signature(s) Signed: 01/28/2019 5:34:10 PM By: Baltazar Najjar MD Entered By: Baltazar Najjar on 01/28/2019 17:34:10 -------------------------------------------------------------------------------- HxROS Details Patient  Name:  Date of Service: Sarah, [name] 01/28/2019 1:15 PM Medical Record [id number] Patient Account Number: [id number] Date of Birth/Sex: Treating RN: [birth year] (64 y.o. Female) Yevonne Pax Primary Care Provider: Chevis Pretty Other Clinician: Referring Provider: Treating Provider/Extender:Rydell Wiegel, [name], [facility] Weeks in Treatment: 0 Information Obtained From Patient Constitutional Symptoms (General Health) Complaints and Symptoms: Negative for: Fatigue; Fever; Chills; Marked Weight Change Medical History: Past Medical History Notes: 64 year old female with history of DM type II and ulcer on planter aspect left heel. Eyes Complaints and Symptoms: Negative for: Dry Eyes; Vision Changes; Glasses / Contacts Medical History: Positive for: Glaucoma - both eyes Negative for: Cataracts; Optic Neuritis Ear/Nose/Mouth/Throat Complaints and Symptoms: Negative for: Chronic sinus problems or rhinitis Medical History: Positive for: Chronic sinus problems/congestion Respiratory Complaints and Symptoms: Negative for: Chronic or frequent coughs; Shortness of Breath Medical History: Positive for: Sleep Apnea - cpap at night Negative for: Aspiration; Asthma; Chronic Obstructive Pulmonary Disease (COPD); Pneumothorax; Tuberculosis Cardiovascular Complaints and Symptoms: Negative for: Chest pain Medical History: Positive for: Hypertension Negative for: Angina; Arrhythmia; Congestive Heart Failure; Coronary Artery Disease; Deep Vein Thrombosis; Hypotension; Myocardial Infarction; Peripheral Arterial Disease; Peripheral Venous Disease; Phlebitis; Vasculitis Gastrointestinal Complaints and Symptoms: Negative for: Frequent diarrhea; Nausea; Vomiting Medical History: Negative for: Cirrhosis ; Colitis; Crohns; Hepatitis A; Hepatitis B; Hepatitis C Past Medical History Notes: History of GERD Endocrine Complaints and Symptoms: Negative for: Heat/cold  intolerance Medical History: Positive for: Type II Diabetes Time with diabetes: 12 years Treated with: Oral agents Blood sugar tested every day: Yes Tested : daily Blood sugar testing results: Breakfast: 117 Genitourinary Complaints and Symptoms: Negative for: Frequent urination Medical History: Past Medical History Notes: kidney disease Neurologic Complaints and Symptoms: Negative for: Numbness/parasthesias Medical History: Positive for: Neuropathy - feet Negative for: Dementia; Quadriplegia; Paraplegia; Seizure Disorder Psychiatric Complaints and Symptoms: Negative for: Claustrophobia; Suicidal Medical History: Negative for: Anorexia/bulimia; Confinement Anxiety Hematologic/Lymphatic Medical History: Positive for: Anemia - iron pills Negative for: Hemophilia; Human Immunodeficiency Virus; Lymphedema; Sickle Cell Disease Immunological Medical History: Negative for: Lupus Erythematosus; Raynauds; Scleroderma Integumentary (Skin) Medical History: Negative for: History of Burn Musculoskeletal Medical History: Positive for: Rheumatoid Arthritis Negative for: Gout; Osteoarthritis; Osteomyelitis Oncologic Medical History: Negative for: Received Chemotherapy; Received Radiation HBO Extended History Items Ear/Nose/Mouth/Throat: Eyes: Chronic sinus Glaucoma problems/congestion Immunizations Pneumococcal Vaccine: Received Pneumococcal Vaccination: No Implantable Devices None Hospitalization / Surgery History Type of Hospitalization/Surgery Left foot IandD Left foot surgery Ovary right Left ovary and appy Family and Social History Cancer: No; Diabetes: Yes - Mother; Heart Disease: Yes - Father,Mother; Hereditary Spherocytosis: No; Hypertension: Yes - Mother; Kidney Disease: Yes - Father; Lung Disease: Yes - Father; Seizures: No; Stroke: Yes - Father; Thyroid Problems: Yes - Mother; Tuberculosis: No; Former smoker; Marital Status - Single; Alcohol Use: Never; Drug  Use: No History; Caffeine Use: Moderate; Financial Concerns: No; Food, Clothing or Shelter Needs: No; Support System Lacking: No; Transportation Concerns: No Electronic Signature(s) Signed: 01/28/2019 6:04:26 PM By: Baltazar Najjar MD Signed: 01/28/2019 6:08:38 PM By: Yevonne Pax RN Entered By: Yevonne Pax on 01/28/2019 13:56:14 -------------------------------------------------------------------------------- SuperBill Details Patient Name: Date of Service: [name] 01/28/2019 Medical Record [id number] Patient Account Number: [id number] Date of Birth/Sex: Treating RN: 1955-02-24 (63 y.o. Female) Zandra Abts Primary Care Provider: Chevis Pretty Other Clinician: Referring Provider: Treating Provider/Extender:Dailyn Kempner, [name], [facility] Weeks in Treatment: 0 Diagnosis Coding ICD-10 Codes Code Description E11.621 Type 2 diabetes mellitus with foot ulcer L97.524 Non-pressure chronic ulcer of other part of left foot with necrosis of  bone L97.511 Non-pressure chronic ulcer of other part of right foot limited to breakdown of skin L97.514 Non-pressure chronic ulcer of other part of right foot with necrosis of bone E11.42 Type 2 diabetes mellitus with diabetic polyneuropathy M14.671 Charcot's joint, right ankle and foot M14.672 Charcot's joint, left ankle and foot Facility Procedures CPT4 Code Description: 80321224 99213 - WOUND CARE VISIT-LEV 3 EST PT Modifier: 25 Quantity: 1 CPT4 Code Description: [id number] 11042 - DEB SUBQ TISSUE 20 SQ CM/< ICD-10 Diagnosis Description L97.514 Non-pressure chronic ulcer of other part of right foot with Modifier: 59 necrosis of bon Quantity: 1 e CPT4 Code Description: 48889169 11043 - DEB MUSC/FASCIA 20 SQ CM/< ICD-10 Diagnosis Description L97.524 Non-pressure chronic ulcer of other part of left foot with n Modifier: ecrosis of bone Quantity: 1 Physician Procedures CPT4 Code Description: [id number] WC PHYS LEVEL 3 NEW PT  ICD-10 Diagnosis Description L97.524 Non-pressure chronic ulcer of other part of left foot with L97.514 Non-pressure chronic ulcer of other part of right foot wit E11.621 Type 2 diabetes mellitus with  foot ulcer L97.511 Non-pressure chronic ulcer of other part of right foot lim Modifier: 25 necrosis of b h necrosis of ited to breakd Quantity: 1 one bone own of skin CPT4 Code Description: [id number] 11042 - WC PHYS SUBQ TISS 20 SQ CM ICD-10 Diagnosis Description L97.514 Non-pressure chronic ulcer of other part of right foot with ne Modifier: 59 crosis of bone Quantity: 1 CPT4 Code Description: 1791505 11043 - WC PHYS DEBR MUSCLE/FASCIA 20 SQ CM ICD-10 Diagnosis Description L97.524 Non-pressure chronic ulcer of other part of left foot with nec Modifier: rosis of bone Quantity: 1 Electronic Signature(s) Signed: 01/29/2019 6:16:53 PM By: Baltazar Najjar MD Signed: 01/29/2019 6:18:44 PM By: Zandra Abts RN, BSN Previous Signature: 01/28/2019 6:04:26 PM Version By: Baltazar Najjar MD Previous Signature: 01/28/2019 6:04:26 PM Version By: Baltazar Najjar MD Entered By: Zandra Abts on 01/28/2019 18:51:40

## 2019-02-04 ENCOUNTER — Other Ambulatory Visit: Payer: Self-pay

## 2019-02-04 ENCOUNTER — Encounter (HOSPITAL_BASED_OUTPATIENT_CLINIC_OR_DEPARTMENT_OTHER): Payer: Medicare Other | Admitting: Internal Medicine

## 2019-02-04 DIAGNOSIS — E11621 Type 2 diabetes mellitus with foot ulcer: Secondary | ICD-10-CM | POA: Diagnosis not present

## 2019-02-05 ENCOUNTER — Other Ambulatory Visit (HOSPITAL_COMMUNITY): Payer: Self-pay | Admitting: Internal Medicine

## 2019-02-05 ENCOUNTER — Other Ambulatory Visit: Payer: Self-pay | Admitting: Internal Medicine

## 2019-02-05 DIAGNOSIS — L97524 Non-pressure chronic ulcer of other part of left foot with necrosis of bone: Secondary | ICD-10-CM

## 2019-02-05 NOTE — Progress Notes (Signed)
[name], [name] ([id number]) Visit Report for 02/04/2019 HPI Details Patient Name: Date of Service: [name], [name] 02/04/2019 3:15 PM Medical Record [id number] Patient Account Number: [id number] Date of Birth/Sex: Treating RN: January 07, 1956 (64 y.o. F) Primary Care Provider: Chevis Pretty Other Clinician: Referring Provider: Treating Provider/Extender:Stellarose Cerny, [name], [facility] Weeks in Treatment: 1 History of Present Illness Location: left plantar foot Quality: Patient reports No Pain. Duration: 2 months Modifying Factors: d.m. HPI Description: 64 yrs old bf here for follow up recurrent left charcot foot ulcer. She has DM. She is not a smoker. She states a blister developed 3 months ago at site of previous breakdown from 1 year ago. It was debrided at the podiatrist's office. She went to the ER and then sent here. She denies pain. Xray was negative for osteo. Culture from this clinic negative. 09/08/14 [name] to [name] for Charcot foot. Seen by him and MRI scheduled 09/19/14. Prior silver collagen but this was not ordered last visit, patient has been rinsing with saline alone. Re institute silver collagen every other day. F/u 2 weeks with [name] as Labor Day holiday. Supplies ordered. 09/25/14; this is a patient with a Charcot foot on the left. she has a wound over the plantar aspect of her left calcaneus. She has been seen by [name] of orthopedic surgery and has had an MRI. She is apparently going within the next week or 2 for corrective surgery which will involve an external fixator. I don't have any of the details of this. 10/06/14; The patient is a 64 yrs old bf with a left Charcot foot and ulcer on the plantar. 12/01/14 Returns post surgery with [name]. Has follow up visit later this week with him, currently in facility and NWB over this extremity. States no drainage from foot and no current wound care. On exam callus and scab  in place, suspect healed. Has external fixator in place. Will defer to [name] for management, ok to place moisturizing cream over scabs and callus and she will call if she requires follow up here. READMISSION 01/28/2019 Patient was in the clinic in 2016 for a wound on the plantar aspect of her left calcaneus. Predominantly cared for by [name] she was referred to [name] and had corrective surgery for the position of her left ankle I believe. She had previously been here in 2015 and then prior to that in 2011 2012 that I do not have these records. More recently she has developed fairly extensive wounds on the right medial foot, left lateral foot and a small area on the right medial great toe. Right medial foot wound has been there for 6 to 7 months, left foot wound for 2 to 3 months and the right great toe only over the last few weeks. She has been followed by [name] at Dr. Laverta [place] office it sounds as though she has been undergoing callus paring and silver nitrate. The patient has been washing these off with soap and water and plan applying dry dressing Past medical history, type 2 diabetes well controlled with a recent hemoglobin A1c of 7 on 11/16, type 2 diabetes with peripheral neuropathy, previous left Charcot joint surgery bilateral Charcot joints currently, stage III chronic renal failure, hypertension, obstructive sleep apnea, history of MRSA and gastroesophageal reflux. 1/18; this is a patient with bilateral Charcot feet. Plain x-rays that I did of both of these wounds that are either at bone or precariously close to bone were done.  On the right foot this showed possible lytic destruction involving the anterior aspect of the talus which may represent a wound osteomyelitis. An MRI was recommended. On the left there was no definite lytic destruction although the wound is deep undermining's and I think probably requires an MRI on this basis in any case Electronic  Signature(s) Signed: 02/05/2019 4:08:35 PM By: Baltazar Najjar MD Entered By: Baltazar Najjar on 02/04/2019 17:49:55 -------------------------------------------------------------------------------- Physical Exam Details Patient Name: Date of Service: [name] 02/04/2019 3:15 PM Medical Record [id number] Patient Account Number: [id number] Date of Birth/Sex: Treating RN: 1955-10-01 (64 y.o. F) Primary Care Provider: Chevis Pretty Other Clinician: Referring Provider: Treating Provider/Extender:Ashling Roane, [name], [facility] Weeks in Treatment: 1 Constitutional Sitting or standing Blood Pressure is within target range for patient.. Pulse regular and within target range for patient.Marland Kitchen Respirations regular, non-labored and within target range.. Temperature is normal and within the target range for the patient.Marland Kitchen Appears in no distress. Respiratory work of breathing is normal. Cardiovascular No pulses are palpable bilateral. Musculoskeletal I watched her leave the clinic. Her crutches significantly to support her self. She is valgus at both knees decreased stride length.. Pes planus deformity of both feet.. Notes Wound exam On the left lateral midfoot fairly extensive wound that does not probe to bone although there is extensive undermining. There is circumferential callus although I have not been debriding this this week. At some point consider an extensive debridement here On the right foot there is a large area medially. Senescent looking wound. A crevice in the middle probes to bone. Electronic Signature(s) Signed: 02/05/2019 4:08:35 PM By: Baltazar Najjar MD Entered By: Baltazar Najjar on 02/04/2019 17:53:30 -------------------------------------------------------------------------------- Physician Orders Details Patient Name: Date of Service: [name] 02/04/2019 3:15 PM Medical Record [id number] Patient Account Number: [id number] Date of  Birth/Sex: Treating RN: 09-22-1955 (64 y.o. [name] Primary Care Provider: Chevis Pretty Other Clinician: Referring Provider: Treating Provider/Extender:Lalanya Rufener, [name], [facility] Weeks in Treatment: 1 Verbal / Phone Orders: No Diagnosis Coding ICD-10 Coding Code Description E11.621 Type 2 diabetes mellitus with foot ulcer L97.524 Non-pressure chronic ulcer of other part of left foot with necrosis of bone L97.511 Non-pressure chronic ulcer of other part of right foot limited to breakdown of skin L97.514 Non-pressure chronic ulcer of other part of right foot with necrosis of bone E11.42 Type 2 diabetes mellitus with diabetic polyneuropathy M14.671 Charcot's joint, right ankle and foot M14.672 Charcot's joint, left ankle and foot Follow-up Appointments Return Appointment in 1 week. Dressing Change Frequency Wound #10 Left,Lateral,Plantar Foot Change dressing every day. Wound #11 Right,Medial Toe Great Change dressing every day. Wound #9 Right,Medial Foot Change dressing every day. Wound Cleansing May shower and wash wound with soap and water. - on days that dressing is changed Primary Wound Dressing Wound #10 Left,Lateral,Plantar Foot Calcium Alginate with Silver Wound #11 Right,Medial Toe Great Calcium Alginate with Silver Wound #9 Right,Medial Foot Calcium Alginate with Silver Secondary Dressing Wound #10 Left,Lateral,Plantar Foot Kerlix/Rolled Gauze Dry Gauze Other: - foam donut Wound #9 Right,Medial Foot Kerlix/Rolled Gauze Dry Gauze Other: - foam donut Off-Loading Removable cast walker boot to: - right foot Radiology MRI, right foot with and without contrast - Non healing ulcers on right foot, rule out Osteomyelitis. May obtain I-stat Creatinine if needed prior to exam. - (ICD10 L97.514 - Non-pressure chronic ulcer of other part of right foot with necrosis of bone) MRI, left foot with and without contrast - Non healing ulcer on left  foot, rule out Osteomyelitis. May  obtain I-stat Creatinine if needed prior to exam. - (ICD10 L97.524 - Non-pressure chronic ulcer of other part of left foot with necrosis of bone) Electronic Signature(s) Signed: 02/04/2019 6:02:32 PM By: Levan Hurst RN, BSN Signed: 02/05/2019 4:08:35 PM By: Linton Ham MD Entered By: Levan Hurst on 02/04/2019 16:49:58 -------------------------------------------------------------------------------- Prescription 02/04/2019 Patient Name: [name] Provider: Linton Ham MD Date of Birth: 1955/01/19 NPI#: [id number] Sex: F DEA#: [id number] Phone #: [phone] License #: [id number] Patient Address: [address] [place], [place] [address] [place], [place] [address] [phone] Allergies codeine Reaction: swelling in throat and itching Severity: Severe pravastatin Reaction: cramps, intolerence Severity: Severe Provider's Orders MRI, right foot with and without contrast - ICD10: L97.514 - Non healing ulcers on right foot, rule out Osteomyelitis. May obtain I-stat Creatinine if needed prior to exam. Signature(s): Date(s): Prescription 02/04/2019 Patient Name: [facility] Provider: Linton Ham MD Date of Birth: [birth year] NPI#: [id number] Sex: F DEA#: [id number] Phone #: [phone] License #: [id number] Patient Address: [address], [place] [address] [place], [place] [address] [phone] Allergies codeine Reaction: swelling in throat and itching Severity: Severe pravastatin Reaction: cramps, intolerence Severity: Severe Provider's Orders MRI, left foot with and without contrast - ICD10: L97.524 - Non healing ulcer on left foot, rule out Osteomyelitis. May obtain I-stat Creatinine if needed prior to exam. Signature(s): Date(s): Electronic  Signature(s) Signed: 02/04/2019 6:02:32 PM By: Levan Hurst RN, BSN Signed: 02/05/2019 4:08:35 PM By: Linton Ham MD Entered By: Levan Hurst on 02/04/2019 16:49:59 --------------------------------------------------------------------------------  Problem List Details Patient Name: Date of Service: [name] 02/04/2019 3:15 PM Medical Record [id number] Patient Account Number: [id number] Date of Birth/Sex: Treating RN: [birth year] (63 y.o. [name] Primary Care Provider: Dewayne Hatch Other Clinician: Referring Provider: Dewayne Hatch Treating Provider/Extender:Tatem Fesler, Esperanza Richters in Treatment: 1 Active Problems ICD-10 Evaluated Encounter Code Description Active Date Today Diagnosis E11.621 Type 2 diabetes mellitus with foot ulcer 01/28/2019 No Yes L97.524 Non-pressure chronic ulcer of other part of left foot 01/28/2019 No Yes with necrosis of bone L97.511 Non-pressure chronic ulcer of other part of right foot 01/28/2019 No Yes limited to breakdown of skin L97.514 Non-pressure chronic ulcer of other part of right foot 01/28/2019 No Yes with necrosis of bone E11.42 Type 2 diabetes mellitus with diabetic polyneuropathy 01/28/2019 No Yes M14.671 Charcot's joint, right ankle and foot 01/28/2019 No Yes M14.672 Charcot's joint, left ankle and foot 01/28/2019 No Yes Inactive Problems Resolved Problems Electronic Signature(s) Signed: 02/05/2019 4:08:35 PM By: Linton Ham MD Entered By: Linton Ham on 02/04/2019 17:48:36 -------------------------------------------------------------------------------- Progress Note Details Patient Name: Date of Service: [name]. 02/04/2019 3:15 PM Medical Record [id number] Patient Account Number: [id number] Date of Birth/Sex: Treating RN: 1955/07/31 (64 y.o. F) Primary Care Provider: Dewayne Hatch Other Clinician: Referring Provider: Dewayne Hatch Treating  Provider/Extender:Railynn Ballo, Esperanza Richters in Treatment: 1 Subjective History of Present Illness (HPI) The following HPI elements were documented for the patient's wound: Location: left plantar foot Quality: Patient reports No Pain. Duration: 2 months Modifying Factors: d.m. 64 yrs old bf here for follow up recurrent left charcot foot ulcer. She has DM. She is not a smoker. She states a blister developed 3 months ago at site of previous breakdown from 1 year ago. It was debrided at the podiatrist's office. She went to the ER and then sent here.  She denies pain. Xray was negative for osteo. Culture from this clinic negative. 09/08/14 Referred by [name] to [name] for Charcot foot. Seen by him and MRI scheduled 09/19/14. Prior silver collagen but this was not ordered last visit, patient has been rinsing with saline alone. Re institute silver collagen every other day. F/u 2 weeks with [name] as Labor Day holiday. Supplies ordered. 09/25/14; this is a patient with a Charcot foot on the left. she has a wound over the plantar aspect of her left calcaneus. She has been seen by [name] of orthopedic surgery and has had an MRI. She is apparently going within the next week or 2 for corrective surgery which will involve an external fixator. I don't have any of the details of this. 10/06/14; The patient is a 64 yrs old bf with a left Charcot foot and ulcer on the plantar. 12/01/14 Returns post surgery with [name]. Has follow up visit later this week with him, currently in facility and NWB over this extremity. States no drainage from foot and no current wound care. On exam callus and scab in place, suspect healed. Has external fixator in place. Will defer to [name] for management, ok to place moisturizing cream over scabs and callus and she will call if she requires follow up here. READMISSION 01/28/2019 Patient was in the clinic in 2016 for a wound on the plantar aspect of her left  calcaneus. Predominantly cared for by [name] she was referred to [name] and had corrective surgery for the position of her left ankle I believe. She had previously been here in 2015 and then prior to that in 2011 2012 that I do not have these records. More recently she has developed fairly extensive wounds on the right medial foot, left lateral foot and a small area on the right medial great toe. Right medial foot wound has been there for 6 to 7 months, left foot wound for 2 to 3 months and the right great toe only over the last few weeks. She has been followed by [name] at Dr. Laverta [place] office it sounds as though she has been undergoing callus paring and silver nitrate. The patient has been washing these off with soap and water and plan applying dry dressing Past medical history, type 2 diabetes well controlled with a recent hemoglobin A1c of 7 on 11/16, type 2 diabetes with peripheral neuropathy, previous left Charcot joint surgery bilateral Charcot joints currently, stage III chronic renal failure, hypertension, obstructive sleep apnea, history of MRSA and gastroesophageal reflux. 1/18; this is a patient with bilateral Charcot feet. Plain x-rays that I did of both of these wounds that are either at bone or precariously close to bone were done. On the right foot this showed possible lytic destruction involving the anterior aspect of the talus which may represent a wound osteomyelitis. An MRI was recommended. On the left there was no definite lytic destruction although the wound is deep undermining's and I think probably requires an MRI on this basis in any case Objective Constitutional Sitting or standing Blood Pressure is within target range for patient.. Pulse regular and within target range for patient.Marland Kitchen Respirations regular, non-labored and within target range.. Temperature is normal and within the target range for the patient.Marland Kitchen Appears in no distress. Vitals Time Taken:  3:39 PM, Height: 68 in, Source: Stated, Weight: 265 lbs, Source: Stated, BMI: 40.3, Temperature: 98.3 F, Pulse: 74 bpm, Respiratory Rate: 18 breaths/min, Blood Pressure: 139/54 mmHg, Capillary  Blood Glucose: 134 mg/dl. General Notes: glucose per pt report Respiratory work of breathing is normal. Cardiovascular No pulses are palpable bilateral. Musculoskeletal I watched her leave the clinic. Her crutches significantly to support her self. She is valgus at both knees decreased stride length.. Pes planus deformity of both feet.. General Notes: Wound exam ooOn the left lateral midfoot fairly extensive wound that does not probe to bone although there is extensive undermining. There is circumferential callus although I have not been debriding this this week. At some point consider an extensive debridement here ooOn the right foot there is a large area medially. Senescent looking wound. A crevice in the middle probes to bone. Integumentary (Hair, Skin) Wound #10 status is Open. Original cause of wound was Blister. The wound is located on the Left,Lateral,Plantar Foot. The wound measures 1.4cm length x 1.9cm width x 1.4cm depth; 2.089cm^2 area and 2.925cm^3 volume. There is bone, muscle, and Fat Layer (Subcutaneous Tissue) Exposed exposed. There is no tunneling noted, however, there is undermining starting at 7:00 and ending at 11:00 with a maximum distance of 1.7cm. There is a small amount of serosanguineous drainage noted. The wound margin is thickened. There is large (67-100%) red granulation within the wound bed. There is no necrotic tissue within the wound bed. Wound #11 status is Open. Original cause of wound was Blister. The wound is located on the [employer]. The wound measures 1cm length x 1.5cm width x 0.1cm depth; 1.178cm^2 area and 0.118cm^3 volume. There is Fat Layer (Subcutaneous Tissue) Exposed exposed. There is no tunneling or undermining noted. There is a none  present amount of drainage noted. The wound margin is flat and intact. There is no granulation within the wound bed. There is a large (67-100%) amount of necrotic tissue within the wound bed including Adherent Slough. Wound #9 status is Open. Original cause of wound was Blister. The wound is located on the Right,Medial Foot. The wound measures 3.4cm length x 3.8cm width x 1.2cm depth; 10.147cm^2 area and 12.177cm^3 volume. There is Fat Layer (Subcutaneous Tissue) Exposed exposed. There is no tunneling or undermining noted. There is a medium amount of serosanguineous drainage noted. The wound margin is flat and intact. There is large (67-100%) red, pink granulation within the wound bed. There is no necrotic tissue within the wound bed. Assessment Active Problems ICD-10 Type 2 diabetes mellitus with foot ulcer Non-pressure chronic ulcer of other part of left foot with necrosis of bone Non-pressure chronic ulcer of other part of right foot limited to breakdown of skin Non-pressure chronic ulcer of other part of right foot with necrosis of bone Type 2 diabetes mellitus with diabetic polyneuropathy Charcot's joint, right ankle and foot Charcot's joint, left ankle and foot Plan Follow-up Appointments: Return Appointment in 1 week. Dressing Change Frequency: Wound #10 Left,Lateral,Plantar Foot: Change dressing every day. Wound #11 Right,Medial Toe Great: Change dressing every day. Wound #9 Right,Medial Foot: Change dressing every day. Wound Cleansing: May shower and wash wound with soap and water. - on days that dressing is changed Primary Wound Dressing: Wound #10 Left,Lateral,Plantar Foot: Calcium Alginate with Silver Wound #11 Right,Medial Toe Great: Calcium Alginate with Silver Wound #9 Right,Medial Foot: Calcium Alginate with Silver Secondary Dressing: Wound #10 Left,Lateral,Plantar Foot: Kerlix/Rolled Gauze Dry Gauze Other: - foam donut Wound #9 Right,Medial  Foot: Kerlix/Rolled Gauze Dry Gauze Other: - foam donut Off-Loading: Removable cast walker boot to: - right foot Radiology ordered were: MRI, right foot with and without contrast - Non healing ulcers  on right foot, rule out Osteomyelitis. May obtain I-stat Creatinine if needed prior to exam., MRI, left foot with and without contrast - Non healing ulcer on left foot, rule out Osteomyelitis. May obtain I-stat Creatinine if needed prior to exam. 1. We continued with silver alginate 2. I agree with bilateral MRIs of the feeto Bilateral chronic osteomyelitis 3. I watched her walk out of the clinic. I cannot imagine that we would be able to put either 1 of these feet in a total contact cast. She is wearing surgical shoes I think this is the best offloading were going to get unless we can find her to a wheelchair and I do not know if this would be possible Electronic Signature(s) Signed: 02/05/2019 4:08:35 PM By: Baltazar Najjar MD Entered By: Baltazar Najjar on 02/04/2019 17:54:38 -------------------------------------------------------------------------------- SuperBill Details Patient Name: Date of Service: [name] 02/04/2019 Medical Record [id number] Patient Account Number: [id number] Date of Birth/Sex: Treating RN: 05-20-1955 (63 y.o. F) Primary Care Provider: Chevis Pretty Other Clinician: Referring Provider: Treating Provider/Extender:Elleen Coulibaly, [name], [facility] Weeks in Treatment: 1 Diagnosis Coding ICD-10 Codes Code Description E11.621 Type 2 diabetes mellitus with foot ulcer L97.524 Non-pressure chronic ulcer of other part of left foot with necrosis of bone L97.511 Non-pressure chronic ulcer of other part of right foot limited to breakdown of skin L97.514 Non-pressure chronic ulcer of other part of right foot with necrosis of bone E11.42 Type 2 diabetes mellitus with diabetic polyneuropathy M14.671 Charcot's joint, right ankle and  foot M14.672 Charcot's joint, left ankle and foot Facility Procedures CPT4 Code: [id number] Description: 99214 - WOUND CARE VISIT-LEV 4 EST PT Modifier: Quantity: 1 Physician Procedures CPT4 Code Description: [id number] 99213 - WC PHYS LEVEL 3 - EST PT ICD-10 Diagnosis Description L97.524 Non-pressure chronic ulcer of other part of left foot wi L97.511 Non-pressure chronic ulcer of other part of right foot l E11.621 Type 2 diabetes  mellitus with foot ulcer Modifier: th necrosis of b imited to breakd Quantity: 1 one own of skin Electronic Signature(s) Signed: 02/04/2019 6:02:32 PM By: Zandra Abts RN, BSN Signed: 02/05/2019 4:08:35 PM By: Baltazar Najjar MD Entered By: Zandra Abts on 02/04/2019 17:55:37

## 2019-02-06 NOTE — Progress Notes (Signed)
[name], [name] ([id number]) Visit Report for 02/04/2019 Arrival Information Details Patient Name: Date of Service: [name], [name] 02/04/2019 3:15 PM Medical Record [id number] Patient Account Number: [id number] Date of Birth/Sex: Treating RN: 1955/09/26 (64 y.o. [name] Primary Care Adriann Thau: Chevis Pretty Other Clinician: Referring Arkin Imran: Treating Nylen Creque/Extender:Robson, [name], [name] Weeks in Treatment: 1 Visit Information History Since Last Visit All ordered tests and consults Yes Patient Arrived: Crutches were completed: Arrival Time: 15:38 Added or deleted any medications: No Accompanied By: self Any new allergies or adverse No Transfer Assistance: None reactions: Patient Identification Verified: Yes Had a fall or experienced change No Secondary Verification Process Completed: Yes in Patient Requires Transmission-Based No activities of daily living that may Precautions: affect Patient Has Alerts: No risk of falls: Signs or symptoms of No abuse/neglect since last visito Hospitalized since last visit: No Implantable device outside of the No clinic excluding cellular tissue based products placed in the center since last visit: Has Dressing in Place as Yes Prescribed: Has Footwear/Offloading in Place Yes as Prescribed: Right: Removable Cast Walker/Walking Boot Pain Present Now: No Electronic Signature(s) Signed: 02/04/2019 5:35:37 PM By: Zenaida Deed RN, BSN Entered By: Zenaida Deed on 02/04/2019 15:38:55 -------------------------------------------------------------------------------- Clinic Level of Care Assessment Details Patient Name: Date of Service: [name] 02/04/2019 3:15 PM Medical Record [id number] Patient Account Number: [id number] Date of Birth/Sex: Treating RN: December 21, 1955 (64 y.o. [name] Primary Care Myrth Dahan: Chevis Pretty Other Clinician: Referring  Angle Dirusso: Treating Tory Mckissack/Extender:Robson, [name], [facility] Weeks in Treatment: 1 Clinic Level of Care Assessment Items TOOL 4 Quantity Score X - Use when only an EandM is performed on FOLLOW-UP visit 1 0 ASSESSMENTS - Nursing Assessment / Reassessment X - Reassessment of Co-morbidities (includes updates in patient status) 1 10 X - Reassessment of Adherence to Treatment Plan 1 5 ASSESSMENTS - Wound and Skin Assessment / Reassessment  - Simple Wound Assessment / Reassessment - one wound 0 X - Complex Wound Assessment / Reassessment - multiple wounds 3 5  - Dermatologic / Skin Assessment (not related to wound area) 0 ASSESSMENTS - Focused Assessment  - Circumferential Edema Measurements - multi extremities 0  - Nutritional Assessment / Counseling / Intervention 0 X - Lower Extremity Assessment (monofilament, tuning fork, pulses) 1 5  - Peripheral Arterial Disease Assessment (using hand held doppler) 0 ASSESSMENTS - Ostomy and/or Continence Assessment and Care  - Incontinence Assessment and Management 0  - Ostomy Care Assessment and Management (repouching, etc.) 0 PROCESS - Coordination of Care X - Simple Patient / Family Education for ongoing care 1 15  - Complex (extensive) Patient / Family Education for ongoing care 0 X - Staff obtains Chiropractor, Records, Test Results / Process Orders 1 10  - Staff telephones HHA, Nursing Homes / Clarify orders / etc 0  - Routine Transfer to another Facility (non-emergent condition) 0  - Routine [name] Admission (non-emergent condition) 0  - New Admissions / Manufacturing engineer / Ordering NPWT, Apligraf, etc. 0  - Emergency [name] Admission (emergent condition) 0 X - Simple Discharge Coordination 1 10  - Complex (extensive) Discharge Coordination 0 PROCESS - Special Needs  - Pediatric / Minor Patient Management 0  - Isolation Patient Management 0  - Hearing / Language / Visual special needs  0  - Assessment of Community assistance (transportation, D/C planning, etc.) 0  - Additional assistance / Altered mentation 0  - Support Surface(s) Assessment (bed, cushion, seat, etc.) 0 INTERVENTIONS - Wound Cleansing / Measurement  -  Simple Wound Cleansing - one wound 0 X - Complex Wound Cleansing - multiple wounds 3 5 X - Wound Imaging (photographs - any number of wounds) 1 5 []  - Wound Tracing (instead of photographs) 0 []  - Simple Wound Measurement - one wound 0 X - Complex Wound Measurement - multiple wounds 3 5 INTERVENTIONS - Wound Dressings []  - Small Wound Dressing one or multiple wounds 0 X - Medium Wound Dressing one or multiple wounds 2 15 []  - Large Wound Dressing one or multiple wounds 0 X - Application of Medications - topical 1 5 []  - Application of Medications - injection 0 INTERVENTIONS - Miscellaneous []  - External ear exam 0 []  - Specimen Collection (cultures, biopsies, blood, body fluids, etc.) 0 []  - Specimen(s) / Culture(s) sent or taken to Lab for analysis 0 []  - Patient Transfer (multiple staff / Civil Service fast streamer / Similar devices) 0 []  - Simple Staple / Suture removal (25 or less) 0 []  - Complex Staple / Suture removal (26 or more) 0 []  - Hypo / Hyperglycemic Management (close monitor of Blood Glucose) 0 []  - Ankle / Brachial Index (ABI) - do not check if billed separately 0 X - Vital Signs 1 5 Has the patient been seen at the [name] within the last three years: Yes Total Score: 145 Level Of Care: New/Established - Level 4 Electronic Signature(s) Signed: 02/04/2019 6:02:32 PM By: Levan Hurst RN, BSN Entered By: Levan Hurst on 02/04/2019 17:55:28 -------------------------------------------------------------------------------- Encounter Discharge Information Details Patient Name: Date of Service: [name]. 02/04/2019 3:15 PM Medical Record [id number] Patient Account Number: [id number] Date of Birth/Sex: Treating RN: [birth year]  (64 y.o. [name] Primary Care Cayle Cordoba: Dewayne Hatch Other Clinician: Referring Abdirahman Chittum: Treating Javi Bollman/Extender:Robson, [name], [facility] Weeks in Treatment: 1 Encounter Discharge Information Items Discharge Condition: Stable Ambulatory Status: Crutches Discharge Destination: Home Transportation: Private Auto Accompanied By: self Schedule Follow-up Appointment: Yes Clinical Summary of Care: Electronic Signature(s) Signed: 02/04/2019 5:45:39 PM By: Deon Pilling Entered By: Deon Pilling on 02/04/2019 17:14:18 -------------------------------------------------------------------------------- Lower Extremity Assessment Details Patient Name: Date of Service: [name], [name] 02/04/2019 3:15 PM Medical Record [id number] Patient Account Number: [id number] Date of Birth/Sex: Treating RN: [birth year] (63 y.o. Elam Dutch Primary Care Koni Kannan: Dewayne Hatch Other Clinician: Referring Teon Hudnall: Treating Amorita Vanrossum/Extender:Robson, [name], [facility] Weeks in Treatment: 1 Edema Assessment Assessed: [Left: No] [Right: No] Edema: [Left: Yes] [Right: Yes] Calf Left: Right: Point of Measurement: 43 cm From Medial Instep 37.4 cm 38.4 cm Ankle Left: Right: Point of Measurement: 12 cm From Medial Instep 24 cm 28.5 cm Vascular Assessment Pulses: Dorsalis Pedis Palpable: [Left:Yes] [Right:No] Electronic Signature(s) Signed: 02/04/2019 5:35:37 PM By: Baruch Gouty RN, BSN Entered By: Baruch Gouty on 02/04/2019 15:45:12 -------------------------------------------------------------------------------- Multi Wound Chart Details Patient Name: Date of Service: [name]. 02/04/2019 3:15 PM Medical Record [id number] Patient Account Number: [id number] Date of Birth/Sex: Treating RN: 12/16/1955 (63 y.o. F) Primary Care Calan Doren: Dewayne Hatch Other Clinician: Referring Keelia Graybill: Treating  Abri Vacca/Extender:Robson, [name], [name] Weeks in Treatment: 1 Vital Signs Height(in): 68 Capillary Blood 134 Glucose(mg/dl): Weight(lbs): 265 Pulse(bpm): 2 Body Mass Index(BMI): 40 Blood Pressure(mmHg): 139/54 Temperature(F): 98.3 Respiratory 18 Rate(breaths/min): Photos: [10:No Photos] [11:No Photos] [9:No Photos] Wound Location: [10:Left Foot - Plantar, Lateral Right Toe Great - Medial] [9:Right Foot - Medial] Wounding Event: [10:Blister] [11:Blister] [9:Blister] 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] [11:Lower Extremity] [9:Lower Extremity] Comorbid History: [10:Glaucoma, Chronic sinus Glaucoma, Chronic sinus Glaucoma, Chronic  sinus problems/congestion, Anemia, Sleep Apnea, Hypertension, Type II Diabetes, Rheumatoid Arthritis, Neuropathy] [11:problems/congestion, Anemia, Sleep Apnea,  Hypertension, Type II Diabetes, Rheumatoid Arthritis, Neuropathy] [9:problems/congestion, Anemia, Sleep Apnea, Hypertension, Type II Diabetes, Rheumatoid Arthritis, Neuropathy] Date Acquired: [10:11/18/2018] [11:01/18/2019] [9:06/18/2018] Weeks of Treatment: [10:1] [11:1] [9:1] Wound Status: [10:Open] [11:Open] [9:Open] Measurements L x W x D 1.4x1.9x1.4 [11:1x1.5x0.1] [9:3.4x3.8x1.2] (cm) Area (cm) : [10:2.089] [11:1.178] [9:10.147] Volume (cm) : [10:2.925] [11:0.118] [9:12.177] % Reduction in Area: [10:-18.80%] [11:-25.10%] [9:7.70%] % Reduction in Volume: [10:-10.80%] [11:-25.50%] [9:-23.00%] Starting Position 1 [10:7] (o'clock): Ending Position 1 [10:11] (o'clock): Maximum Distance 1 [10:1.7] (cm): Undermining: [10:Yes] [11:No] [9:No] Classification: [10:Grade 2] [11:Grade 2] [9:Grade 2] Exudate Amount: [10:Small] [11:None Present] [9:Medium] Exudate Type: [10:Serosanguineous] [11:N/A] [9:Serosanguineous] Exudate Color: [10:red, brown] [11:N/A] [9:red, brown] Wound Margin: [10:Thickened]  [11:Flat and Intact] [9:Flat and Intact] Granulation Amount: [10:Large (67-100%)] [11:None Present (0%)] [9:Large (67-100%)] Granulation Quality: [10:Red] [11:N/A] [9:Red, Pink] Necrotic Amount: [10:None Present (0%)] [11:Large (67-100%)] [9:None Present (0%)] Exposed Structures: [10:Fat Layer (Subcutaneous Tissue) Exposed: Yes Muscle: Yes Bone: Yes Fascia: No Tendon: No Joint: No None] [11:Fat Layer (Subcutaneous Tissue) Exposed: Yes Fascia: No Tendon: No Muscle: No Joint: No Bone: No Small (1-33%)] [9:Fat Layer (Subcutaneous  Tissue) Exposed: Yes Fascia: No Tendon: No Muscle: No Joint: No Bone: No Small (1-33%)] Treatment Notes Wound #10 (Left, Lateral, Plantar Foot) 1. Cleanse With Wound Cleanser 3. Primary Dressing Applied Calcium Alginate Ag 4. Secondary Dressing Dry Gauze Roll Gauze Foam 5. Secured With Medipore tape Notes foam donut as secondary. stockinette. Wound #11 (Right, Medial Toe Great) 1. Cleanse With Wound Cleanser 3. Primary Dressing Applied Calcium Alginate Ag 4. Secondary Dressing Dry Gauze 5. Secured With Medipore tape Wound #9 (Right, Medial Foot) 1. Cleanse With Wound Cleanser 3. Primary Dressing Applied Calcium Alginate Ag 4. Secondary Dressing Dry Gauze Roll Gauze Foam 5. Secured With Medipore tape Notes foam donut as secondary. stockinette. Electronic Signature(s) Signed: 02/05/2019 4:08:35 PM By: Baltazar Najjar MD Entered By: Baltazar Najjar on 02/04/2019 17:48:43 -------------------------------------------------------------------------------- Multi-Disciplinary Care Plan Details Patient Name: Date of Service: [name] 02/04/2019 3:15 PM Medical Record [id number] Patient Account Number: [id number] Date of Birth/Sex: Treating RN: [birth year] (63 y.o. [name] Primary Care Erisha Paugh: Chevis Pretty Other Clinician: Referring Kalianne Fetting: Treating Shyane Fossum/Extender:Robson, [name], [facility] Weeks  in Treatment: 1 Active Inactive Abuse / Safety / Falls / Self Care Management Nursing Diagnoses: Potential for falls Potential for injury related to falls Goals: Patient will not experience any injury related to falls Date Initiated: 01/28/2019 Target Resolution Date: 03/01/2019 Goal Status: Active Patient/caregiver will verbalize/demonstrate measures taken to improve the patient's personal safety Date Initiated: 01/28/2019 Target Resolution Date: 03/01/2019 Goal Status: Active Patient/caregiver will verbalize/demonstrate measures taken to prevent injury and/or falls Date Initiated: 01/28/2019 Target Resolution Date: 03/01/2019 Goal Status: Active Interventions: Assess Activities of Daily Living upon admission and as needed Assess fall risk on admission and as needed Assess: immobility, friction, shearing, incontinence upon admission and as needed Assess impairment of mobility on admission and as needed per policy Assess personal safety and home safety (as indicated) on admission and as needed Assess self care needs on admission and as needed Provide education on fall prevention Provide education on personal and home safety Notes: Nutrition Nursing Diagnoses: Impaired glucose control: actual or potential Potential for alteratiion in Nutrition/Potential for imbalanced nutrition Goals: Patient/caregiver agrees to and verbalizes understanding of need to use nutritional supplements and/or vitamins as prescribed Date Initiated: 01/28/2019 Target Resolution Date: 03/01/2019 Goal Status: Active Patient/caregiver verbalizes understanding  of need to maintain therapeutic glucose control per primary care physician Date Initiated: 01/28/2019 Target Resolution Date: 03/01/2019 Goal Status: Active Interventions: Assess HgA1c results as ordered upon admission and as needed Assess patient nutrition upon admission and as needed per policy Provide education on elevated blood sugars and impact on  wound healing Provide education on nutrition Treatment Activities: Education provided on Nutrition : 01/28/2019 Notes: Wound/Skin Impairment Nursing Diagnoses: Impaired tissue integrity Knowledge deficit related to ulceration/compromised skin integrity Goals: Patient/caregiver will verbalize understanding of skin care regimen Date Initiated: 01/28/2019 Target Resolution Date: 03/01/2019 Goal Status: Active Interventions: Assess patient/caregiver ability to obtain necessary supplies Assess patient/caregiver ability to perform ulcer/skin care regimen upon admission and as needed Assess ulceration(s) every visit Provide education on ulcer and skin care Notes: Electronic Signature(s) Signed: 02/04/2019 6:02:32 PM By: Zandra Abts RN, BSN Entered By: Zandra Abts on 02/04/2019 15:47:58 -------------------------------------------------------------------------------- Pain Assessment Details Patient Name: Date of Service: [name] 02/04/2019 3:15 PM Medical Record [id number] Patient Account Number: [id number] Date of Birth/Sex: Treating RN: March 10, 1955 (64 y.o. [name] Primary Care Nayleen Janosik: Chevis Pretty Other Clinician: Referring Thiago Ragsdale: Treating Demarquis Osley/Extender:Robson, [name], [facility] Weeks in Treatment: 1 Active Problems Location of Pain Severity and Description of Pain Patient Has Paino No Site Locations Rate the pain. Current Pain Level: 0 Pain Management and Medication Current Pain Management: Electronic Signature(s) Signed: 02/04/2019 5:35:37 PM By: Zenaida Deed RN, BSN Entered By: Zenaida Deed on 02/04/2019 15:43:06 -------------------------------------------------------------------------------- Patient/Caregiver Education Details Patient Name: [name] 1/18/2021andnbsp3:15 Date of Service: PM Medical Record [id number] Number: Patient Account Number: [id number] Date of Birth/Gender: 1956-01-13 (63  y.o. F) Treating RN: Zandra Abts Primary Care Other Clinician: Maryla Morrow, Physician: Humphrey Rolls Physician/ExtenderMaryla Morrow, Referring Physician: Glorianne Manchester in Treatment: 1 Education Assessment Education Provided To: Patient Education Topics Provided Nutrition: Methods: Explain/Verbal Responses: State content correctly Safety: Methods: Explain/Verbal Responses: State content correctly Wound/Skin Impairment: Methods: Explain/Verbal Responses: State content correctly Electronic Signature(s) Signed: 02/04/2019 6:02:32 PM By: Zandra Abts RN, BSN Entered By: Zandra Abts on 02/04/2019 15:49:56 -------------------------------------------------------------------------------- Wound Assessment Details Patient Name: Date of Service: [name]. 02/04/2019 3:15 PM Medical Record [id number] Patient Account Number: [id number] Date of Birth/Sex: Treating RN: 1955-07-19 (64 y.o. [name] Primary Care Zandrea Kenealy: Chevis Pretty Other Clinician: Referring Daivion Pape: Treating Charrisse Masley/Extender:Robson, [name], [facility] Weeks in Treatment: 1 Wound Status Wound Number: 10 Primary Diabetic Wound/Ulcer of the Lower Extremity Etiology: Wound Location: Left Foot - Plantar, Lateral Wound Open Wounding Event: Blister Status: Date Acquired: 11/18/2018 Comorbid Glaucoma, Chronic sinus Weeks Of Treatment: 1 History: problems/congestion, Anemia, Sleep Apnea, Clustered Wound: No Hypertension, Type II Diabetes, Rheumatoid Arthritis, Neuropathy Photos Wound Measurements Length: (cm) 1.4 Width: (cm) 1.9 Depth: (cm) 1.4 Area: (cm) 2.089 Volume: (cm) 2.925 % Reduction in Area: -18.8% % Reduction in Volume: -10.8% Epithelialization: None Tunneling: No Undermining: Yes Starting Position (o'clock): 7 Ending Position (o'clock): 11 Maximum Distance: (cm) 1.7 Wound Description Classification: Grade 2 Wound  Margin: Thickened Exudate Amount: Small Exudate Type: Serosanguineous Exudate Color: red, brown Wound Bed Granulation Amount: Large (67-100%) Granulation Quality: Red Necrotic Amount: None Present (0%) Foul Odor After Cleansing: No Slough/Fibrino No Exposed Structure Fascia Exposed: No Fat Layer (Subcutaneous Tissue) Exposed: Yes Tendon Exposed: No Muscle Exposed: Yes Necrosis of Muscle: No Joint Exposed: No Bone Exposed: Yes Treatment Notes Wound #10 (Left, Lateral, Plantar Foot) 1. Cleanse With Wound Cleanser 3. Primary Dressing Applied Calcium Alginate Ag 4. Secondary Dressing Dry Gauze Roll Gauze Foam 5. Secured With [employer]  tape Notes foam donut as secondary. stockinette. Electronic Signature(s) Signed: 02/05/2019 4:37:08 PM By: Benjaman Kindler EMT/HBOT Signed: 02/06/2019 6:03:18 PM By: Zenaida Deed RN, BSN Previous Signature: 02/04/2019 5:35:37 PM Version By: Zenaida Deed RN, BSN Entered By: Benjaman Kindler on 02/05/2019 15:19:53 -------------------------------------------------------------------------------- Wound Assessment Details Patient Name: Date of Service: [name] 02/04/2019 3:15 PM Medical Record [id number] Patient Account Number: [id number] Date of Birth/Sex: Treating RN: 08-05-1955 (63 y.o. [name] Primary Care Basya Casavant: Other Clinician: Chevis Pretty Referring Harper Smoker: Treating Kaulana Brindle/Extender:Robson, [name], [facility] Weeks in Treatment: 1 Wound Status Wound Number: 11 Primary Diabetic Wound/Ulcer of the Lower Extremity Etiology: Wound Location: Right Toe Great - Medial Wound Open Wounding Event: Blister Status: Date Acquired: 01/18/2019 Comorbid Glaucoma, Chronic sinus Weeks Of Treatment: 1 History: problems/congestion, Anemia, Sleep Apnea, Clustered Wound: No Hypertension, Type II Diabetes, Rheumatoid Arthritis, Neuropathy Photos Wound Measurements Length: (cm) 1 Width: (cm)  1.5 Depth: (cm) 0.1 Area: (cm) 1.178 Volume: (cm) 0.118 Wound Description Classification: Grade 2 Wound Margin: Flat and Intact Exudate Amount: None Present Wound Bed Granulation Amount: None Present (0%) Necrotic Amount: Large (67-100%) Necrotic Quality: Adherent Slough r Cleansing: No No Exposed Structure : No cutaneous Tissue) Exposed: Yes : No : No No No % Reduction in Area: -25.1% % Reduction in Volume: -25.5% Epithelialization: Small (1-33%) Tunneling: No Undermining: No Foul Odor Afte Slough/Fibrino Fascia Exposed Fat Layer (Sub Tendon Exposed Muscle Exposed Joint Exposed: Bone Exposed: Treatment Notes Wound #11 (Right, Medial Toe Great) 1. Cleanse With Wound Cleanser 3. Primary Dressing Applied Calcium Alginate Ag 4. Secondary Dressing Dry Gauze 5. Secured With [employer]) Signed: 02/05/2019 4:37:08 PM By: Benjaman Kindler EMT/HBOT Signed: 02/06/2019 6:03:18 PM By: Zenaida Deed RN, BSN Previous Signature: 02/04/2019 5:35:37 PM Version By: Zenaida Deed RN, BSN Entered By: Benjaman Kindler on 02/05/2019 15:18:37 -------------------------------------------------------------------------------- Wound Assessment Details Patient Name: Date of Service: [name] 02/04/2019 3:15 PM Medical Record [id number] Patient Account Number: [id number] Date of Birth/Sex: Treating RN: Jun 08, 1955 (63 y.o. [name] Primary Care Coleen Cardiff: Chevis Pretty Other Clinician: Referring Erik Burkett: Treating Taiana Temkin/Extender:Robson, [name], [facility] Weeks in Treatment: 1 Wound Status Wound Number: 9 Primary Diabetic Wound/Ulcer of the Lower Extremity Etiology: Wound Location: Right Foot - Medial Wound Open Wounding Event: Blister Status: Date Acquired: 06/18/2018 Comorbid Glaucoma, Chronic sinus Weeks Of Treatment: 1 History: problems/congestion, Anemia, Sleep Apnea, Clustered Wound: No Hypertension,  Type II Diabetes, Rheumatoid Arthritis, Neuropathy Photos Wound Measurements Length: (cm) 3.4 % Reductio Width: (cm) 3.8 % Reductio Depth: (cm) 1.2 Epithelial Area: (cm) 10.147 Tunneling Volume: (cm) 12.177 Undermini Wound Description Classification: Grade 2 Wound Margin: Flat and Intact Exudate Amount: Medium Exudate Type: Serosanguineous Exudate Color: red, brown Wound Bed Granulation Amount: Large (67-100%) Granulation Quality: Red, Pink Necrotic Amount: None Present (0%) Foul Odor After Cleansing: No Slough/Fibrino Yes Exposed Structure Fascia Exposed: No Fat Layer (Subcutaneous Tissue) Exposed: Yes Tendon Exposed: No Muscle Exposed: No Joint Exposed: No Bone Exposed: No n in Area: 7.7% n in Volume: -23% ization: Small (1-33%) : No ng: No Treatment Notes Wound #9 (Right, Medial Foot) 1. Cleanse With Wound Cleanser 3. Primary Dressing Applied Calcium Alginate Ag 4. Secondary Dressing Dry Gauze Roll Gauze Foam 5. Secured With Med[employer] tape Notes foam donut as secondary. stockinette. Electronic Signature(s) Signed: 02/05/2019 4:37:08 PM By: Benjaman Kindler EMT/HBOT Signed: 02/06/2019 6:03:18 PM By: Zenaida Deed RN, BSN Previous Signature: 02/04/2019 5:35:37 PM Version By: Zenaida Deed RN, BSN Entered By: Benjaman Kindler on 02/05/2019 15:19:13 -------------------------------------------------------------------------------- Vitals Details  Patient Name: Date of Service: [name], [name] 02/04/2019 3:15 PM Medical Record [id number] Patient Account Number: [id number] Date of Birth/Sex: Treating RN: 1955/03/27 (64 y.o. [name] Primary Care Maddisen Vought: Chevis Pretty Other Clinician: Referring Ianmichael Amescua: Treating Vane Yapp/Extender:Robson, [name], [name] Weeks in Treatment: 1 Vital Signs Time Taken: 15:39 Temperature (F): 98.3 Height (in): 68 Pulse (bpm): 74 Source: Stated Respiratory Rate (breaths/min):  18 Weight (lbs): 265 Blood Pressure (mmHg): 139/54 Source: Stated Capillary Blood Glucose (mg/dl): 295 Body Mass Index (BMI): 40.3 Reference Range: 80 - 120 mg / dl Notes glucose per pt report Electronic Signature(s) Signed: 02/04/2019 5:35:37 PM By: Zenaida Deed RN, BSN Entered By: Zenaida Deed on 02/04/2019 15:42:58

## 2019-02-08 ENCOUNTER — Ambulatory Visit (HOSPITAL_COMMUNITY): Admission: RE | Admit: 2019-02-08 | Payer: Medicare Other | Source: Ambulatory Visit

## 2019-02-11 ENCOUNTER — Encounter (HOSPITAL_COMMUNITY): Payer: Self-pay

## 2019-02-11 ENCOUNTER — Other Ambulatory Visit: Payer: Self-pay

## 2019-02-11 ENCOUNTER — Ambulatory Visit (HOSPITAL_COMMUNITY): Admission: RE | Admit: 2019-02-11 | Payer: Medicare Other | Source: Ambulatory Visit

## 2019-02-11 ENCOUNTER — Encounter (HOSPITAL_BASED_OUTPATIENT_CLINIC_OR_DEPARTMENT_OTHER): Payer: Medicare Other | Attending: Internal Medicine | Admitting: Internal Medicine

## 2019-02-11 DIAGNOSIS — E11621 Type 2 diabetes mellitus with foot ulcer: Secondary | ICD-10-CM | POA: Diagnosis not present

## 2019-02-12 NOTE — Progress Notes (Signed)
[name], [name] ([id number]) Visit Report for 02/11/2019 HPI Details Patient Name: Date of Service: [name], [name] 02/11/2019 1:15 PM Medical Record [id number] Patient Account Number: [id number] Date of Birth/Sex: Treating RN: 07/29/1955 (64 y.o. F) Primary Care Provider: Dewayne Hatch Other Clinician: Referring Provider: Treating Provider/Extender:Moniqua Engebretsen, [name], [facility] Weeks in Treatment: 2 History of Present Illness Location: left plantar foot Quality: Patient reports No Pain. Duration: 2 months Modifying Factors: d.m. HPI Description: 64 yrs old bf here for follow up recurrent left charcot foot ulcer. She has DM. She is not a smoker. She states a blister developed 3 months ago at site of previous breakdown from 1 year ago. It was debrided at the podiatrist's office. She went to the ER and then sent here. She denies pain. Xray was negative for osteo. Culture from this clinic negative. 09/08/14 [address] [name] to [name] for Charcot foot. Seen by him and MRI scheduled 09/19/14. Prior silver collagen but this was not ordered last visit, patient has been rinsing with saline alone. Re institute silver collagen every other day. F/u 2 weeks with Dr. Dellia Nims as Labor Day holiday. Supplies ordered. 09/25/14; this is a patient with a Charcot foot on the left. she has a wound over the plantar aspect of her left calcaneus. She has been seen by [name] of orthopedic surgery and has had an MRI. She is apparently going within the next week or 2 for corrective surgery which will involve an external fixator. I don't have any of the details of this. 10/06/14; The patient is a 64 yrs old bf with a left Charcot foot and ulcer on the plantar. 12/01/14 Returns post surgery with [name]. Has follow up visit later this week with him, currently in facility and NWB over this extremity. States no drainage from foot and no current wound care. On exam callus and scab  in place, suspect healed. Has external fixator in place. Will defer to [name] for management, ok to place moisturizing cream over scabs and callus and she will call if she requires follow up here. READMISSION 01/28/2019 Patient was in the clinic in 2016 for a wound on the plantar aspect of her left calcaneus. Predominantly cared for by Dr. [name] she was referred to [name] and had corrective surgery for the position of her left ankle I believe. She had previously been here in 2015 and then prior to that in 2011 2012 that I do not have these records. More recently she has developed fairly extensive wounds on the right medial foot, left lateral foot and a small area on the right medial great toe. Right medial foot wound has been there for 6 to 7 months, left foot wound for 2 to 3 months and the right great toe only over the last few weeks. She has been followed by [name] at Dr. [name] office it sounds as though she has been undergoing callus paring and silver nitrate. The patient has been washing these off with soap and water and plan applying dry dressing Past medical history, type 2 diabetes well controlled with a recent hemoglobin A1c of 7 on 11/16, type 2 diabetes with peripheral neuropathy, previous left Charcot joint surgery bilateral Charcot joints currently, stage III chronic renal failure, hypertension, obstructive sleep apnea, history of MRSA and gastroesophageal reflux. 1/18; this is a patient with bilateral Charcot feet. Plain x-rays that I did of both of these wounds that are either at bone or precariously close to bone were done.  On the right foot this showed possible lytic destruction involving the anterior aspect of the talus which may represent a wound osteomyelitis. An MRI was recommended. On the left there was no definite lytic destruction although the wound is deep undermining's and I think probably requires an MRI on this basis in any case 1/25; patient was  supposed to have her MRI last Friday of her bilateral feet however they would not do it because there was silver alginate in her dressings, will replace with calcium alginate today and will see if we can get her done today Electronic Signature(s) Signed: 02/12/2019 5:32:11 PM By: Baltazar Najjar MD Entered By: Baltazar Najjar on 02/11/2019 14:30:52 -------------------------------------------------------------------------------- Physical Exam Details Patient Name: Date of Service: [name] 02/11/2019 1:15 PM Medical Record [id number] Patient Account Number: [id number] Date of Birth/Sex: Treating RN: 1955/02/18 (64 y.o. F) Primary Care Provider: Chevis Pretty Other Clinician: Referring Provider: Treating Provider/Extender:Toshi Ishii, [name], [facility] Weeks in Treatment: 2 Constitutional Sitting or standing Blood Pressure is within target range for patient.. Pulse regular and within target range for patient.Marland Kitchen Respirations regular, non-labored and within target range.. Temperature is normal and within the target range for the patient.Marland Kitchen Appears in no distress. Cardiovascular Pedal pulses palpable and strong bilaterally.. Notes Wound examlateral foot and extensive wound without undermining or probably approaches bone. On the right foot more superficial but again an area in the middle that goes to bone. There is no surrounding erythema. Electronic Signature(s) Signed: 02/12/2019 5:32:11 PM By: Baltazar Najjar MD Entered By: Baltazar Najjar on 02/11/2019 14:32:21 -------------------------------------------------------------------------------- Physician Orders Details Patient Name: Date of Service: [name], [name] 02/11/2019 1:15 PM Medical Record [id number] Patient Account Number: [id number] Date of Birth/Sex: Treating RN: 1955-03-11 (64 y.o. [name] Primary Care Provider: Chevis Pretty Other Clinician: Referring Provider:  Treating Provider/Extender:Aylan Bayona, [name], [facility] Weeks in Treatment: 2 Verbal / Phone Orders: No Diagnosis Coding ICD-10 Coding Code Description E11.621 Type 2 diabetes mellitus with foot ulcer L97.524 Non-pressure chronic ulcer of other part of left foot with necrosis of bone L97.511 Non-pressure chronic ulcer of other part of right foot limited to breakdown of skin L97.514 Non-pressure chronic ulcer of other part of right foot with necrosis of bone E11.42 Type 2 diabetes mellitus with diabetic polyneuropathy M14.671 Charcot's joint, right ankle and foot M14.672 Charcot's joint, left ankle and foot Follow-up Appointments Return Appointment in 1 week. Dressing Change Frequency Wound #10 Left,Lateral,Plantar Foot Change dressing every day. Wound #11 Right,Medial Toe Great Change dressing every day. Wound #9 Right,Medial Foot Change dressing every day. Wound Cleansing May shower and wash wound with soap and water. - on days that dressing is changed Primary Wound Dressing Wound #10 Left,Lateral,Plantar Foot Calcium Alginate - switch back to silver alginate after MRI this afternoon Wound #11 Right,Medial Toe Great Calcium Alginate - switch back to silver alginate after MRI this afternoon Wound #9 Right,Medial Foot Calcium Alginate - switch back to silver alginate after MRI this afternoon Secondary Dressing Wound #10 Left,Lateral,Plantar Foot Kerlix/Rolled Gauze Dry Gauze Other: - foam donut Wound #9 Right,Medial Foot Kerlix/Rolled Gauze Dry Gauze Other: - foam donut Off-Loading Removable cast walker boot to: - right foot Electronic Signature(s) Signed: 02/11/2019 6:46:58 PM By: Zandra Abts RN, BSN Signed: 02/12/2019 5:32:11 PM By: Baltazar Najjar MD Entered By: Zandra Abts on 02/11/2019 14:24:53 -------------------------------------------------------------------------------- Problem List Details Patient Name: Date of Service: [facility]  02/11/2019 1:15 PM Medical Record [id number] Patient Account Number: [id number] Date of Birth/Sex: Treating RN: 1955-04-11 (64 y.o. F)  Zandra Abts Primary Care Provider: Chevis Pretty Other Clinician: Referring Provider: Treating Provider/Extender:Eyvette Cordon, [name], [facility] Weeks in Treatment: 2 Active Problems ICD-10 Evaluated Encounter Code Description Active Date Today Diagnosis E11.621 Type 2 diabetes mellitus with foot ulcer 01/28/2019 No Yes L97.524 Non-pressure chronic ulcer of other part of left foot 01/28/2019 No Yes with necrosis of bone L97.511 Non-pressure chronic ulcer of other part of right foot 01/28/2019 No Yes limited to breakdown of skin L97.514 Non-pressure chronic ulcer of other part of right foot 01/28/2019 No Yes with necrosis of bone E11.42 Type 2 diabetes mellitus with diabetic polyneuropathy 01/28/2019 No Yes M14.671 Charcot's joint, right ankle and foot 01/28/2019 No Yes M14.672 Charcot's joint, left ankle and foot 01/28/2019 No Yes Inactive Problems Resolved Problems Electronic Signature(s) Signed: 02/12/2019 5:32:11 PM By: Baltazar Najjar MD Entered By: Baltazar Najjar on 02/11/2019 14:29:13 -------------------------------------------------------------------------------- Progress Note Details Patient Name: Date of Service: [name]. 02/11/2019 1:15 PM Medical Record [id number] Patient Account Number: [id number] Date of Birth/Sex: Treating RN: [birth year] (64 y.o. F) Primary Care Provider: Chevis Pretty Other Clinician: Referring Provider: Treating Provider/Extender:Kennley Schwandt, [name], [facility] Weeks in Treatment: 2 Subjective History of Present Illness (HPI) The following HPI elements were documented for the patient's wound: Location: left plantar foot Quality: Patient reports No Pain. Duration: 2 months Modifying Factors: d.m. 64 yrs old bf here for follow up recurrent left charcot foot  ulcer. She has DM. She is not a smoker. She states a blister developed 3 months ago at site of previous breakdown from 1 year ago. It was debrided at the podiatrist's office. She went to the ER and then sent here. She denies pain. Xray was negative for osteo. Culture from this clinic negative. 09/08/14 [address]erred by [name] to [name] for Charcot foot. Seen by him and MRI scheduled 09/19/14. Prior silver collagen but this was not ordered last visit, patient has been rinsing with saline alone. Re institute silver collagen every other day. F/u 2 weeks with [name] as Labor Day holiday. Supplies ordered. 09/25/14; this is a patient with a Charcot foot on the left. she has a wound over the plantar aspect of her left calcaneus. She has been seen by [name] of orthopedic surgery and has had an MRI. She is apparently going within the next week or 2 for corrective surgery which will involve an external fixator. I don't have any of the details of this. 10/06/14; The patient is a 64 yrs old bf with a left Charcot foot and ulcer on the plantar. 12/01/14 Returns post surgery with [name]. Has follow up visit later this week with him, currently in facility and NWB over this extremity. States no drainage from foot and no current wound care. On exam callus and scab in place, suspect healed. Has external fixator in place. Will defer to [name] for management, ok to place moisturizing cream over scabs and callus and she will call if she requires follow up here. READMISSION 01/28/2019 Patient was in the clinic in 2016 for a wound on the plantar aspect of her left calcaneus. Predominantly cared for by [name] she was referred to [name] and had corrective surgery for the position of her left ankle I believe. She had previously been here in 2015 and then prior to that in 2011 2012 that I do not have these records. More recently she has developed fairly extensive wounds on the right medial foot,  left lateral foot and a small area on the right medial  great toe. Right medial foot wound has been there for 6 to 7 months, left foot wound for 2 to 3 months and the right great toe only over the last few weeks. She has been followed by [name] at Dr. Laverta [place] office it sounds as though she has been undergoing callus paring and silver nitrate. The patient has been washing these off with soap and water and plan applying dry dressing Past medical history, type 2 diabetes well controlled with a recent hemoglobin A1c of 7 on 11/16, type 2 diabetes with peripheral neuropathy, previous left Charcot joint surgery bilateral Charcot joints currently, stage III chronic renal failure, hypertension, obstructive sleep apnea, history of MRSA and gastroesophageal reflux. 1/18; this is a patient with bilateral Charcot feet. Plain x-rays that I did of both of these wounds that are either at bone or precariously close to bone were done. On the right foot this showed possible lytic destruction involving the anterior aspect of the talus which may represent a wound osteomyelitis. An MRI was recommended. On the left there was no definite lytic destruction although the wound is deep undermining's and I think probably requires an MRI on this basis in any case 1/25; patient was supposed to have her MRI last Friday of her bilateral feet however they would not do it because there was silver alginate in her dressings, will replace with calcium alginate today and will see if we can get her done today Objective Constitutional Sitting or standing Blood Pressure is within target range for patient.. Pulse regular and within target range for patient.Marland Kitchen Respirations regular, non-labored and within target range.. Temperature is normal and within the target range for the patient.Marland Kitchen Appears in no distress. Vitals Time Taken: 1:40 PM, Height: 68 in, Weight: 265 lbs, BMI: 40.3, Temperature: 98.3 F, Pulse: 61 bpm, Respiratory  Rate: 18 breaths/min, Blood Pressure: 132/65 mmHg, Capillary Blood Glucose: 125 mg/dl. General Notes: patient stated last CBG was 125 Cardiovascular Pedal pulses palpable and strong bilaterally.. General Notes: Wound examoolateral foot and extensive wound without undermining or probably approaches bone. On the right foot more superficial but again an area in the middle that goes to bone. There is no surrounding erythema. Integumentary (Hair, Skin) Wound #10 status is Open. Original cause of wound was Blister. The wound is located on the Left,Lateral,Plantar Foot. The wound measures 1.6cm length x 1.6cm width x 1.7cm depth; 2.011cm^2 area and 3.418cm^3 volume. There is bone, muscle, and Fat Layer (Subcutaneous Tissue) Exposed exposed. There is no tunneling or undermining noted. There is a small amount of serosanguineous drainage noted. The wound margin is thickened. There is large (67-100%) red granulation within the wound bed. There is a small (1-33%) amount of necrotic tissue within the wound bed including Adherent Slough. Wound #11 status is Open. Original cause of wound was Blister. The wound is located on the [employer]. The wound measures 0.4cm length x 1.5cm width x 0.1cm depth; 0.471cm^2 area and 0.047cm^3 volume. There is no tunneling or undermining noted. There is a none present amount of drainage noted. The wound margin is flat and intact. There is no granulation within the wound bed. There is a large (67-100%) amount of necrotic tissue within the wound bed including Adherent Slough. Wound #9 status is Open. Original cause of wound was Blister. The wound is located on the Right,Medial Foot. The wound measures 3.7cm length x 3.6cm width x 1.2cm depth; 10.462cm^2 area and 12.554cm^3 volume. There is Fat Layer (Subcutaneous Tissue) Exposed exposed. There is  no tunneling or undermining noted. There is a medium amount of serosanguineous drainage noted. The wound margin is well  defined and not attached to the wound base. There is large (67-100%) red, pink granulation within the wound bed. There is no necrotic tissue within the wound bed. Assessment Active Problems ICD-10 Type 2 diabetes mellitus with foot ulcer Non-pressure chronic ulcer of other part of left foot with necrosis of bone Non-pressure chronic ulcer of other part of right foot limited to breakdown of skin Non-pressure chronic ulcer of other part of right foot with necrosis of bone Type 2 diabetes mellitus with diabetic polyneuropathy Charcot's joint, right ankle and foot Charcot's joint, left ankle and foot Plan Follow-up Appointments: Return Appointment in 1 week. Dressing Change Frequency: Wound #10 Left,Lateral,Plantar Foot: Change dressing every day. Wound #11 Right,Medial Toe Great: Change dressing every day. Wound #9 Right,Medial Foot: Change dressing every day. Wound Cleansing: May shower and wash wound with soap and water. - on days that dressing is changed Primary Wound Dressing: Wound #10 Left,Lateral,Plantar Foot: Calcium Alginate - switch back to silver alginate after MRI this afternoon Wound #11 Right,Medial Toe Great: Calcium Alginate - switch back to silver alginate after MRI this afternoon Wound #9 Right,Medial Foot: Calcium Alginate - switch back to silver alginate after MRI this afternoon Secondary Dressing: Wound #10 Left,Lateral,Plantar Foot: Kerlix/Rolled Gauze Dry Gauze Other: - foam donut Wound #9 Right,Medial Foot: Kerlix/Rolled Gauze Dry Gauze Other: - foam donut Off-Loading: Removable cast walker boot to: - right foot 1. Patient's MRIs will be today. o osteomyelitis 2. Even a bigger concern and osteomyelitis is how to offload these areas. I watched her walk out of the clinic last week with her crutches. I cannot imagine having a cast on 1 foot. Electronic Signature(s) Signed: 02/12/2019 5:32:11 PM By: Baltazar Najjar MD Entered By: Baltazar Najjar on  02/11/2019 14:33:20 -------------------------------------------------------------------------------- SuperBill Details Patient Name: Date of Service: [name] 02/11/2019 Medical Record [id number] Patient Account Number: [id number] Date of Birth/Sex: Treating RN: [birth year] (63 y.o. F) Primary Care Provider: Chevis Pretty Other Clinician: Referring Provider: Treating Provider/Extender:Sandralee Tarkington, [name], [facility] Weeks in Treatment: 2 Diagnosis Coding ICD-10 Codes Code Description E11.621 Type 2 diabetes mellitus with foot ulcer L97.524 Non-pressure chronic ulcer of other part of left foot with necrosis of bone L97.511 Non-pressure chronic ulcer of other part of right foot limited to breakdown of skin L97.514 Non-pressure chronic ulcer of other part of right foot with necrosis of bone E11.42 Type 2 diabetes mellitus with diabetic polyneuropathy M14.671 Charcot's joint, right ankle and foot M14.672 Charcot's joint, left ankle and foot Facility Procedures CPT4 Code: [id number] Description: 99214 - WOUND CARE VISIT-LEV 4 EST PT Modifier: Quantity: 1 Physician Procedures CPT4 Code Description: [id number] 99213 - WC PHYS LEVEL 3 - EST PT ICD-10 Diagnosis Description L97.524 Non-pressure chronic ulcer of other part of left foot wi L97.511 Non-pressure chronic ulcer of other part of right foot l E11.621 Type 2 diabetes  mellitus with foot ulcer Modifier: th necrosis of b imited to breakd Quantity: 1 one own of skin Electronic Signature(s) Signed: 02/11/2019 6:46:58 PM By: Zandra Abts RN, BSN Signed: 02/12/2019 5:32:11 PM By: Baltazar Najjar MD Entered By: Zandra Abts on 02/11/2019 18:38:09

## 2019-02-14 NOTE — Progress Notes (Signed)
[name] ([id number]) Visit Report for 02/11/2019 Arrival Information Details Patient Name: Date of Service: [name], [name] 02/11/2019 1:15 PM Medical Record [id number] Patient Account Number: [id number] Date of Birth/Sex: Treating RN: 1955/02/20 (64 y.o. [name] Primary Care [name]: [name] Other Clinician: Referring [name]: Treating [name]/Extender:[name], [name], [facility] Weeks in Treatment: 2 Visit Information History Since Last Visit Added or deleted any medications: No Patient Arrived: [name] Any new allergies or adverse reactions: No Arrival Time: 13:42 Had a fall or experienced change in No Accompanied By: family activities of daily living that may affect member risk of falls: Transfer Assistance: None Signs or symptoms of abuse/neglect since last No Patient Identification Verified: Yes visito Secondary Verification Process Yes Hospitalized since last visit: No Completed: Implantable device outside of the clinic excluding No Patient Requires Transmission-Based No cellular tissue based products placed in the center Precautions: since last visit: Patient Has Alerts: No Has Dressing in Place as Prescribed: Yes Pain Present Now: No Electronic Signature(s) Signed: 02/14/2019 7:44:20 AM By: [name] Entered By: [name] on 02/11/2019 13:43:22 -------------------------------------------------------------------------------- Clinic Level of Care Assessment Details Patient Name: Date of Service: [name], [name] 02/11/2019 1:15 PM Medical Record [id number] Patient Account Number: [id number] Date of Birth/Sex: Treating RN: 1955-02-09 (64 y.o. [name] Primary Care Trinda Harlacher: [name] Other Clinician: Referring Armari Fussell: Treating [name]/Extender:[name], [name], [facility] Weeks in Treatment: 2 Clinic Level of Care Assessment Items TOOL 4  Quantity Score X - Use when only an EandM is performed on FOLLOW-UP visit 1 0 ASSESSMENTS - Nursing Assessment / Reassessment X - Reassessment of Co-morbidities (includes updates in patient status) 1 10 X - Reassessment of Adherence to Treatment Plan 1 5 ASSESSMENTS - Wound and Skin Assessment / Reassessment []  - Simple Wound Assessment / Reassessment - one wound 0 X - Complex Wound Assessment / Reassessment - multiple wounds 3 5 []  - Dermatologic / Skin Assessment (not related to wound area) 0 ASSESSMENTS - Focused Assessment []  - Circumferential Edema Measurements - multi extremities 0 []  - Nutritional Assessment / Counseling / Intervention 0 X - Lower Extremity Assessment (monofilament, tuning fork, pulses) 1 5 []  - Peripheral Arterial Disease Assessment (using hand held doppler) 0 ASSESSMENTS - Ostomy and/or Continence Assessment and Care []  - Incontinence Assessment and Management 0 []  - Ostomy Care Assessment and Management (repouching, etc.) 0 PROCESS - Coordination of Care X - Simple Patient / Family Education for ongoing care 1 15 []  - Complex (extensive) Patient / Family Education for ongoing care 0 X - Staff obtains , Records, Test Results / Process Orders 1 10 []  - Staff telephones HHA, Nursing Homes / Clarify orders / etc 0 []  - Routine Transfer to another Facility (non-emergent condition) 0 []  - Routine Hospital Admission (non-emergent condition) 0 []  - New Admissions / / Ordering NPWT, Apligraf, etc. 0 []  - Emergency Hospital Admission (emergent condition) 0 X - Simple Discharge Coordination 1 10 []  - Complex (extensive) Discharge Coordination 0 PROCESS - Special Needs []  - Pediatric / Minor Patient Management 0 []  - Isolation Patient Management 0 []  - Hearing / Language / Visual special needs 0 []  - Assessment of Community assistance (transportation, D/C planning, etc.) 0 []  - Additional assistance / Altered mentation 0 []  - Support  Surface(s) Assessment (bed, cushion, seat, etc.) 0 INTERVENTIONS - Wound Cleansing / Measurement []  - Simple Wound Cleansing - one wound 0 X - Complex Wound Cleansing - multiple wounds 3 5 X - Wound Imaging (  photographs - any number of wounds) 1 5 []  - Wound Tracing (instead of photographs) 0  - Simple Wound Measurement - one wound 0 X - Complex Wound Measurement - multiple wounds 3 5 INTERVENTIONS - Wound Dressings  - Small Wound Dressing one or multiple wounds 0 X - Medium Wound Dressing one or multiple wounds 2 15  - Large Wound Dressing one or multiple wounds 0 X - Application of Medications - topical 1 5  - Application of Medications - injection 0 INTERVENTIONS - Miscellaneous  - External ear exam 0  - Specimen Collection (cultures, biopsies, blood, body fluids, etc.) 0  - Specimen(s) / Culture(s) sent or taken to Lab for analysis 0  - Patient Transfer (multiple staff / Nurse, adult / Similar devices) 0  - Simple Staple / Suture removal (25 or less) 0  - Complex Staple / Suture removal (26 or more) 0  - Hypo / Hyperglycemic Management (close monitor of Blood Glucose) 0  - Ankle / Brachial Index (ABI) - do not check if billed separately 0 X - Vital Signs 1 5 Has the patient been seen at the hospital within the last three years: Yes Total Score: 145 Level Of Care: New/Established - Level 4 Electronic Signature(s) Signed: 02/11/2019 6:46:58 PM By: Sarah Abts RN, BSN Entered By: [name] on 02/11/2019 18:37:57 -------------------------------------------------------------------------------- Encounter Discharge Information Details Patient Name: Date of Service: Sarah Jaeger. 02/11/2019 1:15 PM Medical Record [id number] Patient Account Number: [id number] Date of Birth/Sex: Treating RN: 1955-07-09 (64 y.o. [name] Primary Care [name]: [name] Other Clinician: Referring [name]: Treating Shabria Egley/Extender:[name],  [name], [facility] Weeks in Treatment: 2 Encounter Discharge Information Items Discharge Condition: Stable Ambulatory Status: [name] Discharge Destination: Home Transportation: Private Auto Accompanied By: self Schedule Follow-up Appointment: Yes Clinical Summary of Care: Electronic Signature(s) Signed: 02/11/2019 5:49:19 PM By: Shawn Stall Entered By: Shawn Stall on 02/11/2019 14:53:47 -------------------------------------------------------------------------------- Lower Extremity Assessment Details Patient Name: Date of Service: [name], [name] 02/11/2019 1:15 PM Medical Record [id number] Patient Account Number: [id number] Date of Birth/Sex: Treating RN: October 19, 1955 (63 y.o. [name] Primary Care Ranger Petrich: [name] Other Clinician: Referring Ben Habermann: Treating Natahsa Marian/Extender:[name], [name], [facility] Weeks in Treatment: 2 Edema Assessment Assessed: [Left: No] [Right: No] Edema: [Left: Yes] [Right: Yes] Calf Left: Right: Point of Measurement: 43 cm From Medial Instep 37 cm 37 cm Ankle Left: Right: Point of Measurement: 12 cm From Medial Instep 24 cm 28.2 cm Vascular Assessment Pulses: Dorsalis Pedis Palpable: [Left:Yes] [Right:Yes] Electronic Signature(s) Signed: 02/14/2019 7:44:20 AM By: [name] Entered By: [name] on 02/11/2019 13:56:09 -------------------------------------------------------------------------------- Multi Wound Chart Details Patient Name: Date of Service: Sarah Jaeger. 02/11/2019 1:15 PM Medical Record [id number] Patient Account Number: [id number] Date of Birth/Sex: Treating RN: Jun 02, 1955 (64 y.o. 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] [11:Lower Extremity] [9:Lower Extremity] Comorbid History: [10:Glaucoma, Chronic sinus Glaucoma, Chronic sinus Glaucoma, Chronic sinus problems/congestion, Anemia, Sleep Apnea, Hypertension, Type II Diabetes, Rheumatoid Arthritis, Neuropathy] [11:problems/congestion, Anemia, Sleep Apnea,  Hypertension, Type II Diabetes, Rheumatoid Arthritis,  Neuropathy] [9:problems/congestion, Anemia, Sleep Apnea, Hypertension, Type II Diabetes, Rheumatoid Arthritis, Neuropathy] Date Acquired: [10:11/18/2018] [11:01/18/2019] [9:06/18/2018] Weeks of Treatment: [10:2] [11:2] [9:2] Wound Status: [10:Open] [11:Open] [9:Open] Measurements L x W x D 1.6x1.6x1.7 [11:0.4x1.5x0.1] [9:3.7x3.6x1.2] (cm) Area (cm) : [10:2.011] [11:0.471] [9:10.462] Volume (cm) : [10:3.418] [11:0.047] [9:12.554] % Reduction in Area: [10:-14.30%] [11:50.00%] [9:4.90%] % Reduction in Volume: -29.50% [11:50.00%] [9:-26.90%] Classification: [10:Grade 2] [11:Grade 2] [9:Grade 2] Exudate Amount: [10:Small] [11:None Present] [9:Medium] Exudate Type: [10:Serosanguineous] [11:N/A] [9:Serosanguineous] Exudate Color: [10:red, brown] [11:N/A] [9:red, brown] Wound Margin: [10:Thickened] [11:Flat and Intact] [9:Well defined, not attached] Granulation Amount: [10:Large (67-100%)] [11:None Present (0%)] [9:Large (67-100%)] Granulation Quality: [10:Red] [11:N/A] [9:Red, Pink] Necrotic Amount: [10:Small (1-33%)] [11:Large (67-100%)] [9:None Present (0%)] Exposed Structures: [10:Fat Layer (Subcutaneous  Tissue) Exposed: Yes Muscle: Yes Bone: Yes Fascia: No Tendon: No Joint: No None] [11:Fascia: No Fat Layer (Subcutaneous Tissue) Exposed: No Tendon: No Muscle: No Joint: No Bone: No Small (1-33%)] [9:Fat Layer (Subcutaneous  Tissue) Exposed: Yes Fascia: No Tendon: No Muscle: No Joint: No Bone: No Small (1-33%)] Treatment Notes Electronic Signature(s) Signed: 02/12/2019 5:32:11 PM By: Baltazar Najjar MD Entered By: Baltazar Najjar on 02/11/2019 14:29:25 -------------------------------------------------------------------------------- Multi-Disciplinary Care Plan Details Patient Name: Date of Service: Sarah Jaeger. 02/11/2019 1:15 PM Medical Record [id number] Patient Account Number: [id number] Date of Birth/Sex: Treating RN: 1955/03/13 (63 y.o. [name] Primary Care Kirandeep Fariss: [name] Other Clinician: Referring Zackry Deines: Treating Tyrell Seifer/Extender:[name], [name], [facility] Weeks in Treatment: 2 Active Inactive Abuse / Safety / Falls / Self Care Management Nursing Diagnoses: Potential for falls Potential for injury related to falls Goals: Patient will not experience any injury related to falls Date Initiated: 01/28/2019 Target Resolution Date: 03/01/2019 Goal Status: Active Patient/caregiver will verbalize/demonstrate measures taken to improve the patient's personal safety Date Initiated: 01/28/2019 Target Resolution Date: 03/01/2019 Goal Status: Active Patient/caregiver will verbalize/demonstrate measures taken to prevent injury and/or falls Date Initiated: 01/28/2019 Target Resolution Date: 03/01/2019 Goal Status: Active Interventions: Assess Activities of Daily Living upon admission and as needed Assess fall risk on admission and as needed Assess: immobility, friction, shearing, incontinence upon admission and as needed Assess impairment of mobility on admission and as needed per policy Assess personal safety and home safety (as indicated)  on admission and as needed Assess self care needs on admission and as needed Provide education on fall prevention Provide education on personal and home safety Notes: Nutrition Nursing Diagnoses: Impaired glucose control: actual or potential Potential for alteratiion in Nutrition/Potential for imbalanced nutrition Goals: Patient/caregiver agrees to and verbalizes understanding of need to use nutritional supplements and/or vitamins as prescribed Date Initiated: 01/28/2019 Target Resolution Date: 03/01/2019 Goal Status: Active Patient/caregiver verbalizes understanding of need to maintain therapeutic glucose control per primary care physician Date Initiated: 01/28/2019 Target Resolution Date: 03/01/2019 Goal Status: Active Interventions: Assess HgA1c results as ordered upon admission and as needed Assess patient nutrition upon admission and as needed per policy Provide education on elevated blood sugars and impact on wound healing Provide education on nutrition Treatment Activities: Education provided on Nutrition : 02/04/2019 Notes: Wound/Skin Impairment Nursing Diagnoses: Impaired tissue integrity Knowledge deficit related to ulceration/compromised skin integrity Goals: Patient/caregiver will verbalize understanding of skin care regimen Date Initiated: 01/28/2019 Target Resolution Date: 03/01/2019 Goal Status: Active Interventions: Assess patient/caregiver ability to obtain necessary supplies Assess patient/caregiver ability to perform ulcer/skin care regimen upon admission and as needed Assess ulceration(s) every visit Provide education on ulcer and skin care Notes: Electronic Signature(s) Signed: 02/11/2019 6:46:58 PM By: Sarah Abts RN, BSN  Entered By: [name] on 02/11/2019 18:36:56 -------------------------------------------------------------------------------- Pain Assessment Details Patient Name: Date of Service: [name], [name] 02/11/2019 1:15 PM Medical  Record [id number] Patient Account Number: [id number] Date of Birth/Sex: Treating RN: 1955/06/07 (64 y.o. [name] Primary Care Shianne Zeiser: [name] Other Clinician: Referring Kalandra Masters: Treating Anja Neuzil/Extender:[name], [name], [facility] Weeks in Treatment: 2 Active Problems Location of Pain Severity and Description of Pain Patient Has Paino No Site Locations Pain Management and Medication Current Pain Management: Electronic Signature(s) Signed: 02/14/2019 7:44:20 AM By: [name] Entered By: [name] on 02/11/2019 13:56:06 -------------------------------------------------------------------------------- Patient/Caregiver Education Details Patient Name: Sarah Jaeger 1/25/2021andnbsp1:15 Date of Service: PM Medical Record [id number] Number: Patient Account Number: [id number] Date of Birth/Gender: 14-May-1955 (63 y.o. F) Treating RN: [name] Primary Care Other Clinician: Maryla Morrow, Physician: Humphrey Rolls Physician/ExtenderMaryla Morrow, Referring Physician: Glorianne Manchester in Treatment: 2 Education Assessment Education Provided To: Patient Education Topics Provided Wound/Skin Impairment: Methods: Explain/Verbal Responses: State content correctly Electronic Signature(s) Signed: 02/11/2019 6:46:58 PM By: Sarah Abts RN, BSN Entered By: [name] on 02/11/2019 18:37:06 -------------------------------------------------------------------------------- Wound Assessment Details Patient Name: Date of Service: Sarah Jaeger 02/11/2019 1:15 PM Medical Record [id number] Patient Account Number: [id number] Date of Birth/Sex: Treating RN: 06-Feb-1955 (63 y.o. F) Primary Care Neftaly Inzunza: [name] Other Clinician: Referring Jabier Deese: Treating Kareema Keitt/Extender:[name], [name], [facility] Weeks in Treatment: 2 Wound Status Wound Number: 10 Primary  Diabetic Wound/Ulcer of the Lower Extremity Etiology: Wound Location: Left Foot - Plantar, Lateral Wound Open Wounding Event: Blister Status: Date Acquired: 11/18/2018 Comorbid Glaucoma, Chronic sinus Weeks Of Treatment: 2 History: problems/congestion, Anemia, Sleep Apnea, Clustered Wound: No Hypertension, Type II Diabetes, Rheumatoid Arthritis, Neuropathy Photos Wound Measurements Length: (cm) 1.6 Width: (cm) 1.6 Depth: (cm) 1.7 Area: (cm) 2.011 Volume: (cm) 3.418 Wound Description Classification: Grade 2 Wound Margin: Thickened Exudate Amount: Small Exudate Type: Serosanguineous Exudate Color: red, brown Wound Bed Granulation Amount: Large (67-100%) Granulation Quality: Red Necrotic Amount: Small (1-33%) Necrotic Quality: Adherent Slough Foul Odor After Cleansing: Slough/Fibrino Yes Exposed Structure Fascia Exposed: N Fat Layer (Subcutaneous Tissue) Exposed: Y Tendon Exposed: N Muscle Exposed: Y Necrosis of Muscle: N Joint Exposed: N Bone Exposed: Y % Reduction in Area: -14.3% % Reduction in Volume: -29.5% Epithelialization: None Tunneling: No Undermining: No No o es o es o o es Treatment Notes Wound #10 (Left, Lateral, Plantar Foot) 1. Cleanse With Wound Cleanser 3. Primary Dressing Applied Calcium Alginate 4. Secondary Dressing Dry Gauze Roll Gauze Foam 5. Secured With Medipore tape Notes foam donut as secondary dressing. Electronic Signature(s) Signed: 02/13/2019 4:33:34 PM By: Benjaman Kindler EMT/HBOT Entered By: Benjaman Kindler on 02/13/2019 10:10:36 -------------------------------------------------------------------------------- Wound Assessment Details Patient Name: Date of Service: [facility] 02/11/2019 1:15 PM Medical Record [id number] Patient Account Number: [id number] Date of Birth/Sex: Treating RN: Dec 16, 1955 (64 y.o. F) Primary Care Sahira Cataldi: [name] Other Clinician: Referring Syanne Looney: Treating  Jahnae Mcadoo/Extender:[name], [name], [facility] Weeks in Treatment: 2 Wound Status Wound Number: 11 Primary Diabetic Wound/Ulcer of the Lower Extremity Etiology: Wound Location: Right Toe Great - Medial Wound Open Wounding Event: Blister Status: Date Acquired: 01/18/2019 Comorbid Glaucoma, Chronic sinus Weeks Of Treatment: 2 History: problems/congestion, Anemia, Sleep Apnea, Clustered Wound: No Hypertension, Type II Diabetes, Rheumatoid Arthritis, Neuropathy Photos Wound Measurements Length: (cm) 0.4 Width: (cm) 1.5 Depth: (cm) 0.1 Area: (cm) 0.471 Volume: (cm) 0.047 Wound Description Classification: Grade 2 Wound Margin: Flat and Intact Exudate Amount: None Present Wound Bed Granulation Amount: None Present (0%) Necrotic Amount: Large (67-100%) Necrotic Quality: Adherent  Slough ter Cleansing: No no Yes Exposed Structure ed: No ubcutaneous Tissue) Exposed: No ed: No ed: No d: No : No % Reduction in Area: 50% % Reduction in Volume: 50% Epithelialization: Small (1-33%) Tunneling: No Undermining: No Foul Odor Af Slough/Fibri Fascia Expos Fat Layer (S Tendon Expos Muscle Expos Joint Expose Bone Exposed Treatment Notes Wound #11 (Right, Medial Toe Great) 1. Cleanse With Wound Cleanser 3. Primary Dressing Applied Calcium Alginate 4. Secondary Dressing Dry Gauze Roll Gauze 5. Secured With [employer]) Signed: 02/13/2019 4:33:34 PM By: Mikeal Hawthorne EMT/HBOT Entered By: Mikeal Hawthorne on 02/13/2019 10:11:09 -------------------------------------------------------------------------------- Wound Assessment Details Patient Name: Date of Service: [facility] 02/11/2019 1:15 PM Medical Record [id number] Patient Account Number: [id number] Date of Birth/Sex: Treating RN: 10-30-1955 (64 y.o. F) Primary Care Gearl Baratta: Dewayne Hatch Other Clinician: Referring Jammy Stlouis: Treating Theo Reither/Extender:[name],  [name], [facility] Weeks in Treatment: 2 Wound Status Wound Number: 9 Primary Diabetic Wound/Ulcer of the Lower Extremity Etiology: Wound Location: Right Foot - Medial Wound Open Wounding Event: Blister Status: Date Acquired: 06/18/2018 Comorbid Glaucoma, Chronic sinus Weeks Of Treatment: 2 History: problems/congestion, Anemia, Sleep Apnea, Clustered Wound: No Hypertension, Type II Diabetes, Rheumatoid Arthritis, Neuropathy Photos Wound Measurements Length: (cm) 3.7 % Reduction in Ar Width: (cm) 3.6 % Reduction in Vo Depth: (cm) 1.2 Epithelialization Area: (cm) 10.462 Tunneling: Volume: (cm) 12.554 Undermining: Wound Description Classification: Grade 2 Foul Odor After C Wound Margin: Well defined, not attached Slough/Fibrino Exudate Amount: Medium Exudate Type: Serosanguineous Exudate Color: red, brown Wound Bed Granulation Amount: Large (67-100%) Granulation Quality: Red, Pink Fascia Exposed: Necrotic Amount: None Present (0%) Fat Layer (Subcut Tendon Exposed: Muscle Exposed: Joint Exposed: Bone Exposed: leansing: No Yes Exposed Structure No aneous Tissue) Exposed: Yes No No No No ea: 4.9% lume: -26.9% : Small (1-33%) No No Treatment Notes Wound #9 (Right, Medial Foot) 1. Cleanse With Wound Cleanser 3. Primary Dressing Applied Calcium Alginate 4. Secondary Dressing Dry Gauze Roll Gauze Foam 5. Secured With Medipore tape 7. Footwear/Offloading device applied Removable Cast Walker/Walking Boot Notes foam donut as secondary dressing. Electronic Signature(s) Signed: 02/13/2019 4:33:34 PM By: Mikeal Hawthorne EMT/HBOT Entered By: Mikeal Hawthorne on 02/13/2019 10:10:15 -------------------------------------------------------------------------------- Vitals Details Patient Name: Date of Service: Sarah Libra. 02/11/2019 1:15 PM Medical Record [id number] Patient Account Number: [id number] Date of Birth/Sex: Treating  RN: Jul 20, 1955 (63 y.o. Clearnce Sorrel Primary Care Ercel Normoyle: Dewayne Hatch Other Clinician: Referring Zoriah Pulice: Treating Mollee Neer/Extender:[name], [name], [facility] Weeks in Treatment: 2 Vital Signs Time Taken: 13:40 Temperature (F): 98.3 Height (in): 68 Pulse (bpm): 61 Weight (lbs): 265 Respiratory Rate (breaths/min): 18 Body Mass Index (BMI): 40.3 Blood Pressure (mmHg): 132/65 Capillary Blood Glucose (mg/dl): 125 Reference Range: 80 - 120 mg / dl Notes patient stated last CBG was 125 Electronic Signature(s) Signed: 02/14/2019 7:44:20 AM By: Kela Millin Entered By: Kela Millin on 02/11/2019 13:56:00

## 2019-02-18 ENCOUNTER — Encounter (HOSPITAL_BASED_OUTPATIENT_CLINIC_OR_DEPARTMENT_OTHER): Payer: Medicare Other | Admitting: Internal Medicine

## 2019-02-18 ENCOUNTER — Other Ambulatory Visit: Payer: Self-pay

## 2019-02-18 ENCOUNTER — Other Ambulatory Visit (HOSPITAL_COMMUNITY): Payer: Self-pay | Admitting: Internal Medicine

## 2019-02-18 DIAGNOSIS — E11610 Type 2 diabetes mellitus with diabetic neuropathic arthropathy: Secondary | ICD-10-CM | POA: Insufficient documentation

## 2019-02-18 DIAGNOSIS — B955 Unspecified streptococcus as the cause of diseases classified elsewhere: Secondary | ICD-10-CM | POA: Diagnosis not present

## 2019-02-18 DIAGNOSIS — M869 Osteomyelitis, unspecified: Secondary | ICD-10-CM | POA: Insufficient documentation

## 2019-02-18 DIAGNOSIS — L97511 Non-pressure chronic ulcer of other part of right foot limited to breakdown of skin: Secondary | ICD-10-CM | POA: Diagnosis not present

## 2019-02-18 DIAGNOSIS — L97518 Non-pressure chronic ulcer of other part of right foot with other specified severity: Secondary | ICD-10-CM

## 2019-02-18 DIAGNOSIS — L97524 Non-pressure chronic ulcer of other part of left foot with necrosis of bone: Secondary | ICD-10-CM | POA: Diagnosis not present

## 2019-02-18 DIAGNOSIS — L97514 Non-pressure chronic ulcer of other part of right foot with necrosis of bone: Secondary | ICD-10-CM | POA: Insufficient documentation

## 2019-02-18 DIAGNOSIS — B9689 Other specified bacterial agents as the cause of diseases classified elsewhere: Secondary | ICD-10-CM | POA: Diagnosis not present

## 2019-02-18 DIAGNOSIS — E11621 Type 2 diabetes mellitus with foot ulcer: Secondary | ICD-10-CM | POA: Diagnosis present

## 2019-02-18 DIAGNOSIS — E1169 Type 2 diabetes mellitus with other specified complication: Secondary | ICD-10-CM | POA: Diagnosis not present

## 2019-02-18 DIAGNOSIS — E1142 Type 2 diabetes mellitus with diabetic polyneuropathy: Secondary | ICD-10-CM | POA: Diagnosis not present

## 2019-02-18 NOTE — Progress Notes (Signed)
[name], [name] ([id number]) Visit Report for 02/18/2019 HPI Details Patient Name: Date of Service: REASE, SWINSON 02/18/2019 3:15 PM Medical Record [id number] Patient Account Number: [id number] Date of Birth/Sex: Treating RN: Aug 13, 1955 (64 y.o. F) Primary Care Provider: Chevis Pretty Other Clinician: Referring Provider: Treating Provider/Extender:Ronan Duecker, [name], [facility] Weeks in Treatment: 3 History of Present Illness Location: left plantar foot Quality: Patient reports No Pain. Duration: 2 months Modifying Factors: d.m. HPI Description: 64 yrs old bf here for follow up recurrent left charcot foot ulcer. She has DM. She is not a smoker. She states a blister developed 3 months ago at site of previous breakdown from 1 year ago. It was debrided at the podiatrist's office. She went to the ER and then sent here. She denies pain. Xray was negative for osteo. Culture from this clinic negative. 09/08/14 [address] [name] to [name] for Charcot foot. Seen by him and MRI scheduled 09/19/14. Prior silver collagen but this was not ordered last visit, patient has been rinsing with saline alone. Re institute silver collagen every other day. F/u 2 weeks with [name] as Labor Day holiday. Supplies ordered. 09/25/14; this is a patient with a Charcot foot on the left. she has a wound over the plantar aspect of her left calcaneus. She has been seen by [name] of orthopedic surgery and has had an MRI. She is apparently going within the next week or 2 for corrective surgery which will involve an external fixator. I don't have any of the details of this. 10/06/14; The patient is a 64 yrs old bf with a left Charcot foot and ulcer on the plantar. 12/01/14 Returns post surgery with [name]. Has follow up visit later this week with him, currently in facility and NWB over this extremity. States no drainage from foot and no current wound care. On exam callus and scab  in place, suspect healed. Has external fixator in place. Will defer to [name] for management, ok to place moisturizing cream over scabs and callus and she will call if she requires follow up here. READMISSION 01/28/2019 Patient was in the clinic in 2016 for a wound on the plantar aspect of her left calcaneus. Predominantly cared for by [name] she was referred to [name] and had corrective surgery for the position of her left ankle I believe. She had previously been here in 2015 and then prior to that in 2011 2012 that I do not have these records. More recently she has developed fairly extensive wounds on the right medial foot, left lateral foot and a small area on the right medial great toe. Right medial foot wound has been there for 6 to 7 months, left foot wound for 2 to 3 months and the right great toe only over the last few weeks. She has been followed by [name] at Dr. Laverta [place] office it sounds as though she has been undergoing callus paring and silver nitrate. The patient has been washing these off with soap and water and plan applying dry dressing Past medical history, type 2 diabetes well controlled with a recent hemoglobin A1c of 7 on 11/16, type 2 diabetes with peripheral neuropathy, previous left Charcot joint surgery bilateral Charcot joints currently, stage III chronic renal failure, hypertension, obstructive sleep apnea, history of MRSA and gastroesophageal reflux. 1/18; this is a patient with bilateral Charcot feet. Plain x-rays that I did of both of these wounds that are either at bone or precariously close to bone were done.  On the right foot this showed possible lytic destruction involving the anterior aspect of the talus which may represent a wound osteomyelitis. An MRI was recommended. On the left there was no definite lytic destruction although the wound is deep undermining's and I think probably requires an MRI on this basis in any case 1/25; patient was  supposed to have her MRI last Friday of her bilateral feet however they would not do it because there was silver alginate in her dressings, will replace with calcium alginate today and will see if we can get her done today 2/1; patient did not get her MRIs done last week because of issues with the MRI department receiving orders to do bilateral feet. She has 2 deep wounds in the setting of Charcot feet deformities bilaterally. Both of them at one point had exposed bone although I had trouble demonstrating that today. I am not sure that we can offload these very aggressively as I watched her leave the clinic last week her gait is very narrow and unsteady. Electronic Signature(s) Signed: 02/18/2019 6:41:12 PM By: Baltazar Najjar MD Entered By: Baltazar Najjar on 02/18/2019 18:20:24 -------------------------------------------------------------------------------- Physical Exam Details Patient Name: Date of Service: [name] 02/18/2019 3:15 PM Medical Record [id number] Patient Account Number: [id number] Date of Birth/Sex: Treating RN: [birth year] (64 y.o. F) Primary Care Provider: Chevis Pretty Other Clinician: Referring Provider: Treating Provider/Extender:Suheily Birks, [name], [facility] Weeks in Treatment: 3 Constitutional Sitting or standing Blood Pressure is within target range for patient.. Pulse regular and within target range for patient.Marland Kitchen Respirations regular, non-labored and within target range.. Temperature is normal and within the target range for the patient.Marland Kitchen Appears in no distress. Cardiovascular Pedal pulses palpable and strong bilaterally.. Integumentary (Hair, Skin) No erythema around the wound. Notes Wound exam; Left lateral midfoot. This is a deep punched out wound. I could not demonstrate probing to bone today although I have done that recently. There is rolled skin and subcutaneous tissue around the edges of this. Right medial foot probably  where the patient rolls to the inner part of her foot when she walks. Again I could not demonstrate clearly that this went to bone today. Electronic Signature(s) Signed: 02/18/2019 6:41:12 PM By: Baltazar Najjar MD Entered By: Baltazar Najjar on 02/18/2019 18:23:00 -------------------------------------------------------------------------------- Physician Orders Details Patient Name: Date of Service: [name] 02/18/2019 3:15 PM Medical Record [id number] Patient Account Number: [id number] Date of Birth/Sex: Treating RN: Apr 17, 1955 (63 y.o. [name] Primary Care Provider: Chevis Pretty Other Clinician: Referring Provider: Treating Provider/Extender:Sacha Radloff, [name], [facility] Weeks in Treatment: 3 Verbal / Phone Orders: No Diagnosis Coding ICD-10 Coding Code Description E11.621 Type 2 diabetes mellitus with foot ulcer L97.524 Non-pressure chronic ulcer of other part of left foot with necrosis of bone L97.511 Non-pressure chronic ulcer of other part of right foot limited to breakdown of skin L97.514 Non-pressure chronic ulcer of other part of right foot with necrosis of bone E11.42 Type 2 diabetes mellitus with diabetic polyneuropathy M14.671 Charcot's joint, right ankle and foot M14.672 Charcot's joint, left ankle and foot Follow-up Appointments Return Appointment in 1 week. Dressing Change Frequency Wound #10 Left,Lateral,Plantar Foot Change dressing every day. Wound #9 Right,Medial Foot Change dressing every day. Wound Cleansing May shower and wash wound with soap and water. - on days that dressing is changed Primary Wound Dressing Wound #10 Left,Lateral,Plantar Foot Calcium Alginate - switch back to silver alginate after MRI tomorrow Wound #9 Right,Medial Foot Calcium Alginate - switch back to silver alginate after MRI tomorrow  Secondary Dressing Wound #10 Left,Lateral,Plantar Foot Kerlix/Rolled Gauze Dry Gauze Other: - foam  donut Wound #9 Right,Medial Foot Kerlix/Rolled Gauze Dry Gauze Other: - foam donut Off-Loading Removable cast walker boot to: - right foot Electronic Signature(s) Signed: 02/18/2019 6:39:44 PM By: Zandra Abts RN, BSN Signed: 02/18/2019 6:41:12 PM By: Baltazar Najjar MD Entered By: Zandra Abts on 02/18/2019 17:13:41 -------------------------------------------------------------------------------- Problem List Details Patient Name: Date of Service: [name]. 02/18/2019 3:15 PM Medical Record [id number] Patient Account Number: [id number] Date of Birth/Sex: Treating RN: April 12, 1955 (64 y.o. [name] Primary Care Provider: Chevis Pretty Other Clinician: Referring Provider: Treating Provider/Extender:Mathius Birkeland, [name], [facility], [facility], [facility] Weeks in Treatment: 3 Active Problems ICD-10 Evaluated Encounter Code Description Active Date Today Diagnosis E11.621 Type 2 diabetes mellitus with foot ulcer 01/28/2019 No Yes L97.524 Non-pressure chronic ulcer of other part of left foot 01/28/2019 No Yes with necrosis of bone L97.511 Non-pressure chronic ulcer of other part of right foot 01/28/2019 No Yes limited to breakdown of skin L97.514 Non-pressure chronic ulcer of other part of right foot 01/28/2019 No Yes with necrosis of bone E11.42 Type 2 diabetes mellitus with diabetic polyneuropathy 01/28/2019 No Yes M14.671 Charcot's joint, right ankle and foot 01/28/2019 No Yes M14.672 Charcot's joint, left ankle and foot 01/28/2019 No Yes Inactive Problems Resolved Problems Electronic Signature(s) Signed: 02/18/2019 6:41:12 PM By: Baltazar Najjar MD Entered By: Baltazar Najjar on 02/18/2019 18:19:10 -------------------------------------------------------------------------------- Progress Note Details Patient Name: Date of Service: [name]. 02/18/2019 3:15 PM Medical Record [id number] Patient Account Number: [id number] Date of Birth/Sex: Treating  RN: 1955/03/07 (64 y.o. F) Primary Care Provider: Chevis Pretty Other Clinician: Referring Provider: Treating Provider/Extender:Breyana Follansbee, [name], [facility] Weeks in Treatment: 3 Subjective History of Present Illness (HPI) The following HPI elements were documented for the patient's wound: Location: left plantar foot Quality: Patient reports No Pain. Duration: 2 months Modifying Factors: d.m. 64 yrs old bf here for follow up recurrent left charcot foot ulcer. She has DM. She is not a smoker. She states a blister developed 3 months ago at site of previous breakdown from 1 year ago. It was debrided at the podiatrist's office. She went to the ER and then sent here. She denies pain. Xray was negative for osteo. Culture from this clinic negative. 09/08/14 Referred by [name] to [name] for Charcot foot. Seen by him and MRI scheduled 09/19/14. Prior silver collagen but this was not ordered last visit, patient has been rinsing with saline alone. Re institute silver collagen every other day. F/u 2 weeks with [name] as Labor Day holiday. Supplies ordered. 09/25/14; this is a patient with a Charcot foot on the left. she has a wound over the plantar aspect of her left calcaneus. She has been seen by [name] of orthopedic surgery and has had an MRI. She is apparently going within the next week or 2 for corrective surgery which will involve an external fixator. I don't have any of the details of this. 10/06/14; The patient is a 64 yrs old bf with a left Charcot foot and ulcer on the plantar. 12/01/14 Returns post surgery with [name]. Has follow up visit later this week with him, currently in facility and NWB over this extremity. States no drainage from foot and no current wound care. On exam callus and scab in place, suspect healed. Has external fixator in place. Will defer to [name] for management, ok to place moisturizing cream over scabs and callus and she will call  if she requires follow  up here. READMISSION 01/28/2019 Patient was in the clinic in 2016 for a wound on the plantar aspect of her left calcaneus. Predominantly cared for by Dr. [name] she was referred to [name] and had corrective surgery for the position of her left ankle I believe. She had previously been here in 2015 and then prior to that in 2011 2012 that I do not have these records. More recently she has developed fairly extensive wounds on the right medial foot, left lateral foot and a small area on the right medial great toe. Right medial foot wound has been there for 6 to 7 months, left foot wound for 2 to 3 months and the right great toe only over the last few weeks. She has been followed by [name] at Dr. [name] office it sounds as though she has been undergoing callus paring and silver nitrate. The patient has been washing these off with soap and water and plan applying dry dressing Past medical history, type 2 diabetes well controlled with a recent hemoglobin A1c of 7 on 11/16, type 2 diabetes with peripheral neuropathy, previous left Charcot joint surgery bilateral Charcot joints currently, stage III chronic renal failure, hypertension, obstructive sleep apnea, history of MRSA and gastroesophageal reflux. 1/18; this is a patient with bilateral Charcot feet. Plain x-rays that I did of both of these wounds that are either at bone or precariously close to bone were done. On the right foot this showed possible lytic destruction involving the anterior aspect of the talus which may represent a wound osteomyelitis. An MRI was recommended. On the left there was no definite lytic destruction although the wound is deep undermining's and I think probably requires an MRI on this basis in any case 1/25; patient was supposed to have her MRI last Friday of her bilateral feet however they would not do it because there was silver alginate in her dressings, will replace with calcium  alginate today and will see if we can get her done today 2/1; patient did not get her MRIs done last week because of issues with the MRI department receiving orders to do bilateral feet. She has 2 deep wounds in the setting of Charcot feet deformities bilaterally. Both of them at one point had exposed bone although I had trouble demonstrating that today. I am not sure that we can offload these very aggressively as I watched her leave the clinic last week her gait is very narrow and unsteady. Objective Constitutional Sitting or standing Blood Pressure is within target range for patient.. Pulse regular and within target range for patient.Marland Kitchen Respirations regular, non-labored and within target range.. Temperature is normal and within the target range for the patient.Marland Kitchen Appears in no distress. Vitals Time Taken: 4:31 PM, Height: 68 in, Weight: 265 lbs, BMI: 40.3, Temperature: 98.1 F, Pulse: 71 bpm, Respiratory Rate: 20 breaths/min, Blood Pressure: 131/54 mmHg. Cardiovascular Pedal pulses palpable and strong bilaterally.. General Notes: Wound exam; ooLeft lateral midfoot. This is a deep punched out wound. I could not demonstrate probing to bone today although I have done that recently. There is rolled skin and subcutaneous tissue around the edges of this. ooRight medial foot probably where the patient rolls to the inner part of her foot when she walks. Again I could not demonstrate clearly that this went to bone today. Integumentary (Hair, Skin) No erythema around the wound. Wound #10 status is Open. Original cause of wound was Blister. The wound is located on the [place]. The wound measures 1.5cm  length x 1.9cm width x 1.7cm depth; 2.238cm^2 area and 3.805cm^3 volume. There is bone, muscle, and Fat Layer (Subcutaneous Tissue) Exposed exposed. There is no tunneling or undermining noted. There is a small amount of serosanguineous drainage noted. The wound margin is  thickened. There is large (67-100%) red granulation within the wound bed. There is a small (1-33%) amount of necrotic tissue within the wound bed including Adherent Slough. Wound #11 status is Healed - Epithelialized. Original cause of wound was Blister. The wound is located on the [employer]. The wound measures 0cm length x 0cm width x 0cm depth; 0cm^2 area and 0cm^3 volume. There is no tunneling or undermining noted. There is a none present amount of drainage noted. The wound margin is flat and intact. There is no granulation within the wound bed. There is no necrotic tissue within the wound bed. Wound #9 status is Open. Original cause of wound was Blister. The wound is located on the Right,Medial Foot. The wound measures 3.9cm length x 3cm width x 1cm depth; 9.189cm^2 area and 9.189cm^3 volume. There is Fat Layer (Subcutaneous Tissue) Exposed exposed. There is no tunneling or undermining noted. There is a medium amount of serosanguineous drainage noted. The wound margin is well defined and not attached to the wound base. There is large (67-100%) red, pink granulation within the wound bed. There is no necrotic tissue within the wound bed. Assessment Active Problems ICD-10 Type 2 diabetes mellitus with foot ulcer Non-pressure chronic ulcer of other part of left foot with necrosis of bone Non-pressure chronic ulcer of other part of right foot limited to breakdown of skin Non-pressure chronic ulcer of other part of right foot with necrosis of bone Type 2 diabetes mellitus with diabetic polyneuropathy Charcot's joint, right ankle and foot Charcot's joint, left ankle and foot Plan Follow-up Appointments: Return Appointment in 1 week. Dressing Change Frequency: Wound #10 Left,Lateral,Plantar Foot: Change dressing every day. Wound #9 Right,Medial Foot: Change dressing every day. Wound Cleansing: May shower and wash wound with soap and water. - on days that dressing is  changed Primary Wound Dressing: Wound #10 Left,Lateral,Plantar Foot: Calcium Alginate - switch back to silver alginate after MRI tomorrow Wound #9 Right,Medial Foot: Calcium Alginate - switch back to silver alginate after MRI tomorrow Secondary Dressing: Wound #10 Left,Lateral,Plantar Foot: Kerlix/Rolled Gauze Dry Gauze Other: - foam donut Wound #9 Right,Medial Foot: Kerlix/Rolled Gauze Dry Gauze Other: - foam donut Off-Loading: Removable cast walker boot to: - right foot 1. Silver alginate to continue to both wound areas 2. I would like to see the MRIs. If she has osteomyelitis she is probably going to need intravenous antibiotics. If she does not the wounds will need to be aggressively debrided but I am still left with an uneasy feeling how I can possibly offload these. The patient lives in a boardinghouse she essentially is on her own she states that she however has a wheelchair spends most of the time there except for brief periods of time where she needs to stand. 3. Watching the patient's gait I am not sure we could tolerate a total contact cast here Electronic Signature(s) Signed: 02/18/2019 6:41:12 PM By: Baltazar Najjar MD Entered By: Baltazar Najjar on 02/18/2019 18:25:23 -------------------------------------------------------------------------------- SuperBill Details Patient Name: Date of Service: [name] 02/18/2019 Medical Record [id number] Patient Account Number: [id number] Date of Birth/Sex: Treating RN: August 12, 1955 (63 y.o. F) Primary Care Provider: Chevis Pretty Other Clinician: Referring Provider: Treating Provider/Extender:Tylar Merendino, [name], [facility] Weeks in Treatment: 3 Diagnosis Coding  ICD-10 Codes Code Description E11.621 Type 2 diabetes mellitus with foot ulcer L97.524 Non-pressure chronic ulcer of other part of left foot with necrosis of bone L97.511 Non-pressure chronic ulcer of other part of right foot limited to  breakdown of skin L97.514 Non-pressure chronic ulcer of other part of right foot with necrosis of bone E11.42 Type 2 diabetes mellitus with diabetic polyneuropathy M14.671 Charcot's joint, right ankle and foot M14.672 Charcot's joint, left ankle and foot Facility Procedures CPT4 Code: 16109604 Description: 99214 - WOUND CARE VISIT-LEV 4 EST PT Modifier: Quantity: 1 Physician Procedures CPT4 Code Description: [id number] 99213 - WC PHYS LEVEL 3 - EST PT ICD-10 Diagnosis Description L97.524 Non-pressure chronic ulcer of other part of left foot wi L97.511 Non-pressure chronic ulcer of other part of right foot l E11.621 Type 2 diabetes  mellitus with foot ulcer E11.42 Type 2 diabetes mellitus with diabetic polyneuropathy Modifier: th necrosis of b imited to breakd Quantity: 1 one own of skin Electronic Signature(s) Signed: 02/18/2019 6:39:44 PM By: Zandra Abts RN, BSN Signed: 02/18/2019 6:41:12 PM By: Baltazar Najjar MD Entered By: Zandra Abts on 02/18/2019 18:36:08

## 2019-02-19 ENCOUNTER — Ambulatory Visit (HOSPITAL_COMMUNITY)
Admission: RE | Admit: 2019-02-19 | Discharge: 2019-02-19 | Disposition: A | Discharge status: Home / Self Care | Payer: Medicare Other | Source: Ambulatory Visit | Attending: Internal Medicine | Admitting: Internal Medicine

## 2019-02-19 DIAGNOSIS — L97518 Non-pressure chronic ulcer of other part of right foot with other specified severity: Secondary | ICD-10-CM | POA: Diagnosis present

## 2019-02-19 DIAGNOSIS — L97524 Non-pressure chronic ulcer of other part of left foot with necrosis of bone: Secondary | ICD-10-CM | POA: Insufficient documentation

## 2019-02-19 LAB — POCT I-STAT CREATININE: Creatinine, Ser: 1.7 mg/dL — ABNORMAL HIGH (ref 0.44–1.00)

## 2019-02-19 MED ORDER — GADOBUTROL 1 MMOL/ML IV SOLN
10.0000 mL | Freq: Once | INTRAVENOUS | Status: AC | PRN
  Administered 2019-02-19: 10 mL via INTRAVENOUS

## 2019-02-26 ENCOUNTER — Encounter (HOSPITAL_BASED_OUTPATIENT_CLINIC_OR_DEPARTMENT_OTHER): Payer: Medicare Other | Attending: Internal Medicine | Admitting: Internal Medicine

## 2019-02-27 NOTE — Assessment & Plan Note (Signed)
Patient is on Janumet (dose increased last visit) -Checking HbA1c today

## 2019-02-27 NOTE — Assessment & Plan Note (Addendum)
Checking CBC today.  ADDENDUM: Patient with normocytic anemia Hb 9.5, MCV 85.  She denied evidence of bleeding except prior bleeding in each wound debridement.  She will need work up including iron panel, ferritin and Gi screening after treating current osteomyelitis.

## 2019-02-27 NOTE — Assessment & Plan Note (Signed)
Checking BMP today.

## 2019-02-27 NOTE — Assessment & Plan Note (Addendum)
The patient's blood pressure during this visit is well controlled at 122/71. The patient is currently taking Lisinopril 10 mg QD. Her last blood pressure visits were 131/65, 138/60. Last BMP was 6 months ago. -Continue Lisinopril 10 mg QD -BMP today  -Will consider adding amlodipine if future if needs more BP control

## 2019-02-27 NOTE — Assessment & Plan Note (Signed)
>>  ASSESSMENT AND PLAN FOR NORMOCYTIC ANEMIA WRITTEN ON 03/02/2019  4:33 PM BY Chevis Pretty, MD  Checking CBC today.  ADDENDUM: Patient with normocytic anemia Hb 9.5, MCV 85.  She denied evidence of bleeding except prior bleeding in each wound debridement.  She will need work up including iron panel, ferritin and Gi screening after treating current osteomyelitis.

## 2019-02-27 NOTE — Progress Notes (Signed)
   CC: DM follow up  HPI:  [name] is a 64 y.o. female with PMHx as documented below, presented for follow up of DM and also chronic foot wound. Please refer to problem based charting for further details and assessment and plan of current problem and chronic medical conditions.   Past Medical History:  Diagnosis Date  . Chronic normocytic anemia   . CKD (chronic kidney disease) stage 3, GFR 30-59 ml/min 02/02/2013  . Diabetes mellitus type II, controlled (HCC) 06/17/2009  . Essential hypertension 06/17/2009  . GERD (gastroesophageal reflux disease)   . Glaucoma   . H/O hiatal hernia   . Hyperlipidemia   . MRSA (methicillin resistant Staphylococcus aureus)   . Obstructive sleep apnea 05/26/2010   Review of Systems:  Constitutional: Negative for chills and fever.  Respiratory: Negative for shortness of breath.   Cardiovascular: Negative for chest pain and leg swelling.  Gastrointestinal: Negative for abdominal pain, nausea and vomiting.  Neurological: Negative for dizziness and headaches.   Physical Exam:  Vitals:   02/28/19 1328  BP: 122/71  Pulse: 69  Temp: 98.2 F (36.8 C)  TempSrc: Oral  SpO2: 100%  Weight: 260 lb 3.2 oz (118 kg)  Height: 5\' 9"  (1.753 m)    Physical Exam  Constitutional: Obese lady, sitting on wheelchair in no acute distress.  Head: Normocephalic and atraumatic.  Eyes: Conjunctivae are normal, EOM nl Cardiovascular:  RRR, nl S1S2, no murmur,  no LEE Respiratory: Effort normal and breath sounds normal. No respiratory distress. No wheezes.  GI: Soft. Bowel sounds are normal. No distension. There is no tenderness.  Neurological: Is alert and oriented x 3  Skin: Not diaphoretic. No erythema.  Extremities: Charcot foot deformity, chronic bilateral foot ulcer. No drainage. Psychiatric: Normal mood and affect. Behavior is normal. Judgment and thought content normal.       Assessment & Plan:   See Encounters Tab for problem based  charting.  Patient discussed with Dr. 

## 2019-02-28 ENCOUNTER — Ambulatory Visit (INDEPENDENT_AMBULATORY_CARE_PROVIDER_SITE_OTHER): Payer: Medicare Other | Admitting: Internal Medicine

## 2019-02-28 ENCOUNTER — Encounter: Payer: Self-pay | Admitting: Internal Medicine

## 2019-02-28 ENCOUNTER — Other Ambulatory Visit: Payer: Self-pay

## 2019-02-28 VITALS — BP 122/71 | HR 69 | Temp 98.2°F | Ht 69.0 in | Wt 260.2 lb

## 2019-02-28 DIAGNOSIS — D649 Anemia, unspecified: Secondary | ICD-10-CM | POA: Diagnosis not present

## 2019-02-28 DIAGNOSIS — M869 Osteomyelitis, unspecified: Secondary | ICD-10-CM | POA: Diagnosis present

## 2019-02-28 DIAGNOSIS — E1169 Type 2 diabetes mellitus with other specified complication: Secondary | ICD-10-CM

## 2019-02-28 DIAGNOSIS — S91332D Puncture wound without foreign body, left foot, subsequent encounter: Secondary | ICD-10-CM | POA: Diagnosis not present

## 2019-02-28 DIAGNOSIS — I10 Essential (primary) hypertension: Secondary | ICD-10-CM | POA: Diagnosis not present

## 2019-02-28 DIAGNOSIS — N1832 Chronic kidney disease, stage 3b: Secondary | ICD-10-CM | POA: Diagnosis not present

## 2019-02-28 LAB — POCT GLYCOSYLATED HEMOGLOBIN (HGB A1C): Hemoglobin A1C: 6.4 % — AB (ref 4.0–5.6)

## 2019-02-28 LAB — GLUCOSE, CAPILLARY: Glucose-Capillary: 136 mg/dL — ABNORMAL HIGH (ref 70–99)

## 2019-02-28 NOTE — Patient Instructions (Addendum)
It was our pleasure taking care of you in our clinic today.  Your blood pressure today was normal. Your A1c (for your diabetes) is very good. Please continue great job of taking your medications regularly and having a low carb diet.  I send you to lab for some blood work and will call you if the result will be abnormal or if need to change your medications. Your MRI shows probable bone infection. I understand that you follow with wound care regularly (who ordered the MRI) and have an appointment on Monday. Please give them a call and make sure you will be seen either tomorrow or Monday. If yoy developed fever, chills, nausea or vomiting, please go to the emergency room.  Please take your medications as instructed and contact us if you have any question or concern.  Please come back to clinic next week if you were not able to see your wound care doctor. As always, if having severe symptoms, please seek medical attention at emergency room.  Thank you

## 2019-02-28 NOTE — Assessment & Plan Note (Addendum)
Pt is following with wound care regularly and there has been concern for osteomyelitis and MRI performed. 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 I discuss the finding with her and that she will need Ab therapy +/- surgical procedure/biopsy. She does not have any systemic symptoms such as fever or chills.  I check CRP to have the trend. She is going to give the ortho care office a call and asks for earlier appointment. She is going to see wound care team on Monday to discuss the MRI result and plan. If she will not need surgery, Antibiotic therapy should not be delayed anymore so she will call their office and ask for earlier appointment. We will follow up with her.  I send a message to [name] who ordered the MRI and has seen her in wound care clinic.

## 2019-03-01 LAB — CBC WITH DIFFERENTIAL/PLATELET
Basophils Absolute: 0 10*3/uL (ref 0.0–0.2)
Basos: 1 %
EOS (ABSOLUTE): 0.2 10*3/uL (ref 0.0–0.4)
Eos: 2 %
Hematocrit: 30.5 % — ABNORMAL LOW (ref 34.0–46.6)
Hemoglobin: 9.3 g/dL — ABNORMAL LOW (ref 11.1–15.9)
Immature Grans (Abs): 0 10*3/uL (ref 0.0–0.1)
Immature Granulocytes: 1 %
Lymphocytes Absolute: 1.9 10*3/uL (ref 0.7–3.1)
Lymphs: 23 %
MCH: 25.8 pg — ABNORMAL LOW (ref 26.6–33.0)
MCHC: 30.5 g/dL — ABNORMAL LOW (ref 31.5–35.7)
MCV: 85 fL (ref 79–97)
Monocytes Absolute: 0.7 10*3/uL (ref 0.1–0.9)
Monocytes: 8 %
Neutrophils Absolute: 5.3 10*3/uL (ref 1.4–7.0)
Neutrophils: 65 %
Platelets: 331 10*3/uL (ref 150–450)
RBC: 3.6 x10E6/uL — ABNORMAL LOW (ref 3.77–5.28)
RDW: 15.4 % (ref 11.7–15.4)
WBC: 8.2 10*3/uL (ref 3.4–10.8)

## 2019-03-01 LAB — BMP8+ANION GAP
Anion Gap: 11 mmol/L (ref 10.0–18.0)
BUN/Creatinine Ratio: 17 (ref 12–28)
BUN: 21 mg/dL (ref 8–27)
CO2: 23 mmol/L (ref 20–29)
Calcium: 9.5 mg/dL (ref 8.7–10.3)
Chloride: 104 mmol/L (ref 96–106)
Creatinine, Ser: 1.22 mg/dL — ABNORMAL HIGH (ref 0.57–1.00)
GFR calc Af Amer: 54 mL/min/{1.73_m2} — ABNORMAL LOW (ref >59)
GFR calc non Af Amer: 47 mL/min/{1.73_m2} — ABNORMAL LOW (ref >59)
Glucose: 131 mg/dL — ABNORMAL HIGH (ref 65–99)
Potassium: 4.7 mmol/L (ref 3.5–5.2)
Sodium: 138 mmol/L (ref 134–144)

## 2019-03-01 LAB — C-REACTIVE PROTEIN: CRP: 21 mg/L — ABNORMAL HIGH (ref 0–10)

## 2019-03-02 ENCOUNTER — Encounter: Payer: Self-pay | Admitting: Internal Medicine

## 2019-03-02 DIAGNOSIS — M869 Osteomyelitis, unspecified: Secondary | ICD-10-CM | POA: Insufficient documentation

## 2019-03-02 NOTE — Assessment & Plan Note (Signed)
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 She sees wound care team regularly. On last visit, the MRI was ordered to evaluate for probable osteomyelitis.  She is symptoms free w/o fever, chills. No new drainage from her wounds. How ever the MRI I concerning for osteo. I send a message to [name] who ordered the MRI and also asked patient to see him ASAP (she mentions that she will see him on Monday). If no plan for surgery/biopsy, we should start antibiotic. I will follow on her on Tuesday.

## 2019-03-04 ENCOUNTER — Encounter (HOSPITAL_BASED_OUTPATIENT_CLINIC_OR_DEPARTMENT_OTHER): Payer: Medicare Other | Admitting: Internal Medicine

## 2019-03-04 ENCOUNTER — Other Ambulatory Visit: Payer: Self-pay | Admitting: Internal Medicine

## 2019-03-04 ENCOUNTER — Other Ambulatory Visit (HOSPITAL_COMMUNITY)
Admission: RE | Admit: 2019-03-04 | Discharge: 2019-03-04 | Disposition: A | Discharge status: Home / Self Care | Payer: Medicare Other | Source: Other Acute Inpatient Hospital | Attending: Internal Medicine | Admitting: Internal Medicine

## 2019-03-04 ENCOUNTER — Other Ambulatory Visit: Payer: Self-pay

## 2019-03-04 ENCOUNTER — Other Ambulatory Visit (HOSPITAL_BASED_OUTPATIENT_CLINIC_OR_DEPARTMENT_OTHER): Payer: Self-pay | Admitting: Internal Medicine

## 2019-03-04 DIAGNOSIS — E11621 Type 2 diabetes mellitus with foot ulcer: Secondary | ICD-10-CM | POA: Insufficient documentation

## 2019-03-04 DIAGNOSIS — E1169 Type 2 diabetes mellitus with other specified complication: Secondary | ICD-10-CM

## 2019-03-04 NOTE — Progress Notes (Signed)
Sarah, [name] ([id number]) Visit Report for 03/04/2019 Debridement Details Patient Name: Date of Service: [name], [name] 03/04/2019 1:30 PM Medical Record [id number] Patient Account Number: [id number] Date of Birth/Sex: Treating RN: 08-16-1955 (64 y.o. [name] Primary Care Provider: Chevis Pretty Other Clinician: Referring Provider: Chevis Pretty Treating Provider/Extender:Javonni Macke, Lamar Sprinkles in Treatment: 5 Debridement Performed for Wound #10 Left,Lateral,Plantar Foot Assessment: Performed By: Physician [name], MD Debridement Type: Debridement Severity of Tissue Pre Fat layer exposed Debridement: Level of Consciousness (Pre- Awake and Alert procedure): Pre-procedure Verification/Time Out Taken: Yes - 15:40 Start Time: 15:40 Pain Control: Other : Benzocaine 20% Total Area Debrided (L x W): 1.6 (cm) x 1.5 (cm) = 2.4 (cm) Tissue and other material Viable, Non-Viable, Bone, Callus, Subcutaneous, Skin: Epidermis debrided: Level: Skin/Subcutaneous Tissue/Muscle/Bone Debridement Description: Excisional Instrument: Blade, Forceps Specimen: Tissue Culture Number of Specimens Taken: 1 Bleeding: Moderate Hemostasis Achieved: Silver Nitrate End Time: 15:43 Procedural Pain: 0 Post Procedural Pain: 0 Response to Treatment: Procedure was tolerated well Level of Consciousness Awake and Alert (Post-procedure): Post Debridement Measurements of Total Wound Length: (cm) 1.6 Width: (cm) 1.5 Depth: (cm) 2 Volume: (cm) 3.77 Character of Wound/Ulcer Post Improved Debridement: Severity of Tissue Post Debridement: Fat layer exposed Post Procedure Diagnosis Same as Pre-procedure Electronic Signature(s) Signed: 03/04/2019 6:14:31 PM By: Zandra Abts RN, BSN Signed: 03/04/2019 6:16:05 PM By: Baltazar Najjar MD Entered By: Baltazar Najjar on 03/04/2019  15:57:45 -------------------------------------------------------------------------------- HPI Details Patient Name: Date of Service: [name]. 03/04/2019 1:30 PM Medical Record [id number] Patient Account Number: [id number] Date of Birth/Sex: Treating RN: 1955/04/20 (64 y.o. [name] Primary Care Provider: Chevis Pretty Other Clinician: Referring Provider: Treating Provider/Extender:Laketha Leopard, [name], [facility] Weeks in Treatment: 5 History of Present Illness Location: left plantar foot Quality: Patient reports No Pain. Duration: 2 months Modifying Factors: d.m. HPI Description: 64 yrs old bf here for follow up recurrent left charcot foot ulcer. She has DM. She is not a smoker. She states a blister developed 3 months ago at site of previous breakdown from 1 year ago. It was debrided at the podiatrist's office. She went to the ER and then sent here. She denies pain. Xray was negative for osteo. Culture from this clinic negative. 09/08/14 Referred by [name] to [name] for Charcot foot. Seen by him and MRI scheduled 09/19/14. Prior silver collagen but this was not ordered last visit, patient has been rinsing with saline alone. Re institute silver collagen every other day. F/u 2 weeks with [name] as Labor Day holiday. Supplies ordered. 09/25/14; this is a patient with a Charcot foot on the left. she has a wound over the plantar aspect of her left calcaneus. She has been seen by [name] of orthopedic surgery and has had an MRI. She is apparently going within the next week or 2 for corrective surgery which will involve an external fixator. I don't have any of the details of this. 10/06/14; The patient is a 64 yrs old bf with a left Charcot foot and ulcer on the plantar. 12/01/14 Returns post surgery with [name]. Has follow up visit later this week with him, currently in facility and NWB over this extremity. States no drainage from foot and  no current wound care. On exam callus and scab in place, suspect healed. Has external fixator in place. Will defer to [name] for management, ok to place moisturizing cream over scabs and callus and she will call if she requires follow up here. READMISSION 01/28/2019  Patient was in the clinic in 2016 for a wound on the plantar aspect of her left calcaneus. Predominantly cared for by [name] she was referred to [name] and had corrective surgery for the position of her left ankle I believe. She had previously been here in 2015 and then prior to that in 2011 2012 that I do not have these records. More recently she has developed fairly extensive wounds on the right medial foot, left lateral foot and a small area on the right medial great toe. Right medial foot wound has been there for 6 to 7 months, left foot wound for 2 to 3 months and the right great toe only over the last few weeks. She has been followed by [name] at Dr. Laverta [place] office it sounds as though she has been undergoing callus paring and silver nitrate. The patient has been washing these off with soap and water and plan applying dry dressing Past medical history, type 2 diabetes well controlled with a recent hemoglobin A1c of 7 on 11/16, type 2 diabetes with peripheral neuropathy, previous left Charcot joint surgery bilateral Charcot joints currently, stage III chronic renal failure, hypertension, obstructive sleep apnea, history of MRSA and gastroesophageal reflux. 1/18; this is a patient with bilateral Charcot feet. Plain x-rays that I did of both of these wounds that are either at bone or precariously close to bone were done. On the right foot this showed possible lytic destruction involving the anterior aspect of the talus which may represent a wound osteomyelitis. An MRI was recommended. On the left there was no definite lytic destruction although the wound is deep undermining's and I think probably requires  an MRI on this basis in any case 1/25; patient was supposed to have her MRI last Friday of her bilateral feet however they would not do it because there was silver alginate in her dressings, will replace with calcium alginate today and will see if we can get her done today 2/1; patient did not get her MRIs done last week because of issues with the MRI department receiving orders to do bilateral feet. She has 2 deep wounds in the setting of Charcot feet deformities bilaterally. Both of them at one point had exposed bone although I had trouble demonstrating that today. I am not sure that we can offload these very aggressively as I watched her leave the clinic last week her gait is very narrow and unsteady. 2/15; the MRI of her right foot did not show osteomyelitis potentially osteonecrosis of the talus. 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 The wounds are a large wound on the right medial foot and on the left a substantial wound on the left lateral plantar calcaneus. We have been using silver alginate. Ultimately I think we need to consider a total contact cast on the right while we work through the possibility of osteomyelitis in the left. I watched her walk out with her crutches I have some trepidation about the ability to walk with the cast on her right foot. Nevertheless with her Charcot deformities I do not think there is a way to heal these short of offloading them aggressively. Electronic Signature(s) Signed: 03/04/2019 6:16:05 PM By: Baltazar Najjar MD Entered By: Baltazar Najjar on 03/04/2019 15:59:46 -------------------------------------------------------------------------------- Physical Exam Details Patient Name: Date of Service: [name] 03/04/2019 1:30 PM Medical Record [id number] Patient Account Number: [id number] Date of Birth/Sex: Treating RN: Feb 05, 1955 (63 y.o. [name] Primary Care  Provider:  Chevis Pretty Other Clinician: Referring Provider: Treating Provider/Extender:Keldan Eplin, [name], [facility] Weeks in Treatment: 5 Constitutional Patient is hypertensive.. Pulse regular and within target range for patient.Marland Kitchen Respirations regular, non-labored and within target range.. Temperature is normal and within the target range for the patient.Marland Kitchen Appears in no distress. Notes Wound exam Left lateral midfoot at the level of her calcaneus. This is a deep punched out wound. Using a scalpel and pickups I remove the thick skin and subcutaneous tissue from the circumference. I found the area that was in close proximity to bone. I obtained specimen of bone for pathology and CNS On the right foot this is more medial. There is no exposed bone currently. Rolled callused edges around the wound. Electronic Signature(s) Signed: 03/04/2019 6:16:05 PM By: Baltazar Najjar MD Entered By: Baltazar Najjar on 03/04/2019 16:01:11 -------------------------------------------------------------------------------- Physician Orders Details Patient Name: Date of Service: [name]. 03/04/2019 1:30 PM Medical Record [id number] Patient Account Number: [id number] Date of Birth/Sex: Treating RN: December 31, 1955 (63 y.o. [name] Primary Care Provider: Chevis Pretty Other Clinician: Referring Provider: Treating Provider/Extender:Marieliz Strang, [name], [facility] Weeks in Treatment: 5 Verbal / Phone Orders: No Diagnosis Coding ICD-10 Coding Code Description E11.621 Type 2 diabetes mellitus with foot ulcer L97.524 Non-pressure chronic ulcer of other part of left foot with necrosis of bone L97.511 Non-pressure chronic ulcer of other part of right foot limited to breakdown of skin L97.514 Non-pressure chronic ulcer of other part of right foot with necrosis of bone E11.42 Type 2 diabetes mellitus with diabetic polyneuropathy M14.671 Charcot's joint, right ankle and  foot M14.672 Charcot's joint, left ankle and foot Follow-up Appointments Return Appointment in 1 week. Dressing Change Frequency Wound #10 Left,Lateral,Plantar Foot Change dressing every day. Wound #9 Right,Medial Foot Change dressing every day. Wound Cleansing May shower and wash wound with soap and water. - on days that dressing is changed Primary Wound Dressing Wound #10 Left,Lateral,Plantar Foot Calcium Alginate with Silver Wound #9 Right,Medial Foot Calcium Alginate with Silver Secondary Dressing Wound #10 Left,Lateral,Plantar Foot Kerlix/Rolled Gauze Dry Gauze Other: - foam donut Wound #9 Right,Medial Foot Kerlix/Rolled Gauze Dry Gauze Other: - foam donut Off-Loading Removable cast walker boot to: - right foot Laboratory Bacteria identified in Unspecified specimen by Anaerobe culture (MICRO) - Bone - left plantar foot - (ICD10 E11.621 - Type 2 diabetes mellitus with foot ulcer) LOINC Code: 635-3 Convenience Name: Anerobic culture Bacteria identified in Tissue by Biopsy culture (MICRO) - Bone - left plantar foot - (ICD10 E11.621 - Type 2 diabetes mellitus with foot ulcer) LOINC Code: 82505-3 Convenience Name: Biopsy specimen culture Electronic Signature(s) Signed: 03/04/2019 6:14:31 PM By: Zandra Abts RN, BSN Signed: 03/04/2019 6:16:05 PM By: Baltazar Najjar MD Entered By: Zandra Abts on 03/04/2019 15:56:31 -------------------------------------------------------------------------------- Problem List Details Patient Name: Date of Service: [name]. 03/04/2019 1:30 PM Medical Record [id number] Patient Account Number: [id number] Date of Birth/Sex: Treating RN: Feb 11, 1955 (64 y.o. [name] Primary Care Provider: Chevis Pretty Other Clinician: Referring Provider: Treating Provider/Extender:Jaselyn Nahm, [name], [facility] Weeks in Treatment: 5 Active Problems ICD-10 Evaluated Encounter Code Description Active Date Today  Diagnosis E11.621 Type 2 diabetes mellitus with foot ulcer 01/28/2019 No Yes L97.524 Non-pressure chronic ulcer of other part of left foot 01/28/2019 No Yes with necrosis of bone L97.511 Non-pressure chronic ulcer of other part of right foot 01/28/2019 No Yes limited to breakdown of skin L97.514 Non-pressure chronic ulcer of other part of right foot 01/28/2019 No Yes with necrosis of bone E11.42 Type 2 diabetes  mellitus with diabetic polyneuropathy 01/28/2019 No Yes M14.671 Charcot's joint, right ankle and foot 01/28/2019 No Yes M14.672 Charcot's joint, left ankle and foot 01/28/2019 No Yes Inactive Problems Resolved Problems Electronic Signature(s) Signed: 03/04/2019 6:16:05 PM By: Baltazar Najjar MD Entered By: Baltazar Najjar on 03/04/2019 15:57:05 -------------------------------------------------------------------------------- Progress Note Details Patient Name: Date of Service: [name]. 03/04/2019 1:30 PM Medical Record [id number] Patient Account Number: [id number] Date of Birth/Sex: Treating RN: 1955-11-05 (64 y.o. [name] Primary Care Provider: Chevis Pretty Other Clinician: Referring Provider: Treating Provider/Extender:Loraina Stauffer, [name], [facility] Weeks in Treatment: 5 Subjective History of Present Illness (HPI) The following HPI elements were documented for the patient's wound: Location: left plantar foot Quality: Patient reports No Pain. Duration: 2 months Modifying Factors: d.m. 64 yrs old bf here for follow up recurrent left charcot foot ulcer. She has DM. She is not a smoker. She states a blister developed 3 months ago at site of previous breakdown from 1 year ago. It was debrided at the podiatrist's office. She went to the ER and then sent here. She denies pain. Xray was negative for osteo. Culture from this clinic negative. 09/08/14 Referred by [name] to [name] for Charcot foot. Seen by him and MRI scheduled 09/19/14.  Prior silver collagen but this was not ordered last visit, patient has been rinsing with saline alone. Re institute silver collagen every other day. F/u 2 weeks with [name] as Labor Day holiday. Supplies ordered. 09/25/14; this is a patient with a Charcot foot on the left. she has a wound over the plantar aspect of her left calcaneus. She has been seen by [name] of orthopedic surgery and has had an MRI. She is apparently going within the next week or 2 for corrective surgery which will involve an external fixator. I don't have any of the details of this. 10/06/14; The patient is a 64 yrs old bf with a left Charcot foot and ulcer on the plantar. 12/01/14 Returns post surgery with [name]. Has follow up visit later this week with him, currently in facility and NWB over this extremity. States no drainage from foot and no current wound care. On exam callus and scab in place, suspect healed. Has external fixator in place. Will defer to [name] for management, ok to place moisturizing cream over scabs and callus and she will call if she requires follow up here. READMISSION 01/28/2019 Patient was in the clinic in 2016 for a wound on the plantar aspect of her left calcaneus. Predominantly cared for by [name] she was referred to [name] and had corrective surgery for the position of her left ankle I believe. She had previously been here in 2015 and then prior to that in 2011 2012 that I do not have these records. More recently she has developed fairly extensive wounds on the right medial foot, left lateral foot and a small area on the right medial great toe. Right medial foot wound has been there for 6 to 7 months, left foot wound for 2 to 3 months and the right great toe only over the last few weeks. She has been followed by [name] at Dr. Laverta [place] office it sounds as though she has been undergoing callus paring and silver nitrate. The patient has been washing these off with  soap and water and plan applying dry dressing Past medical history, type 2 diabetes well controlled with a recent hemoglobin A1c of 7 on 11/16, type 2 diabetes with peripheral neuropathy,  previous left Charcot joint surgery bilateral Charcot joints currently, stage III chronic renal failure, hypertension, obstructive sleep apnea, history of MRSA and gastroesophageal reflux. 1/18; this is a patient with bilateral Charcot feet. Plain x-rays that I did of both of these wounds that are either at bone or precariously close to bone were done. On the right foot this showed possible lytic destruction involving the anterior aspect of the talus which may represent a wound osteomyelitis. An MRI was recommended. On the left there was no definite lytic destruction although the wound is deep undermining's and I think probably requires an MRI on this basis in any case 1/25; patient was supposed to have her MRI last Friday of her bilateral feet however they would not do it because there was silver alginate in her dressings, will replace with calcium alginate today and will see if we can get her done today 2/1; patient did not get her MRIs done last week because of issues with the MRI department receiving orders to do bilateral feet. She has 2 deep wounds in the setting of Charcot feet deformities bilaterally. Both of them at one point had exposed bone although I had trouble demonstrating that today. I am not sure that we can offload these very aggressively as I watched her leave the clinic last week her gait is very narrow and unsteady. 2/15; the MRI of her right foot did not show osteomyelitis potentially osteonecrosis of the talus. 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 The wounds are a large wound on the right medial foot and on the left a substantial wound on the left lateral plantar calcaneus. We have been using silver alginate. Ultimately I think we need  to consider a total contact cast on the right while we work through the possibility of osteomyelitis in the left. I watched her walk out with her crutches I have some trepidation about the ability to walk with the cast on her right foot. Nevertheless with her Charcot deformities I do not think there is a way to heal these short of offloading them aggressively. Objective Constitutional Patient is hypertensive.. Pulse regular and within target range for patient.Marland Kitchen Respirations regular, non-labored and within target range.. Temperature is normal and within the target range for the patient.Marland Kitchen Appears in no distress. Vitals Time Taken: 3:08 PM, Height: 68 in, Weight: 265 lbs, BMI: 40.3, Temperature: 98.3 F, Pulse: 69 bpm, Respiratory Rate: 20 breaths/min, Blood Pressure: 149/80 mmHg, Capillary Blood Glucose: 127 mg/dl. General Notes: CBG per patient General Notes: Wound exam ooLeft lateral midfoot at the level of her calcaneus. This is a deep punched out wound. Using a scalpel and pickups I remove the thick skin and subcutaneous tissue from the circumference. I found the area that was in close proximity to bone. I obtained specimen of bone for pathology and CNS ooOn the right foot this is more medial. There is no exposed bone currently. Rolled callused edges around the wound. Integumentary (Hair, Skin) Wound #10 status is Open. Original cause of wound was Blister. The wound is located on the Left,Lateral,Plantar Foot. The wound measures 1.6cm length x 1.5cm width x 2cm depth; 1.885cm^2 area and 3.77cm^3 volume. There is bone, muscle, and Fat Layer (Subcutaneous Tissue) Exposed exposed. There is no tunneling noted, however, there is undermining starting at 12:00 and ending at 12:00 with a maximum distance of 1.6cm. There is a medium amount of serosanguineous drainage noted. The wound margin is thickened. There is large (67-100%)  red granulation within the wound bed. There is a small (1-33%) amount  of necrotic tissue within the wound bed including Adherent Slough. Wound #9 status is Open. Original cause of wound was Blister. The wound is located on the Right,Medial Foot. The wound measures 4cm length x 3.5cm width x 0.6cm depth; 10.996cm^2 area and 6.597cm^3 volume. There is Fat Layer (Subcutaneous Tissue) Exposed exposed. There is no tunneling or undermining noted. There is a medium amount of serosanguineous drainage noted. The wound margin is well defined and not attached to the wound base. There is large (67-100%) red, pink granulation within the wound bed. There is no necrotic tissue within the wound bed. Assessment Active Problems ICD-10 Type 2 diabetes mellitus with foot ulcer Non-pressure chronic ulcer of other part of left foot with necrosis of bone Non-pressure chronic ulcer of other part of right foot limited to breakdown of skin Non-pressure chronic ulcer of other part of right foot with necrosis of bone Type 2 diabetes mellitus with diabetic polyneuropathy Charcot's joint, right ankle and foot Charcot's joint, left ankle and foot Procedures Wound #10 Pre-procedure diagnosis of Wound #10 is a Diabetic Wound/Ulcer of the Lower Extremity located on the Left,Lateral,Plantar Foot .Severity of Tissue Pre Debridement is: Fat layer exposed. There was a Excisional Skin/Subcutaneous Tissue/Muscle/Bone Debridement with a total area of 2.4 sq cm performed by Ricard Dillon., MD. With the following instrument(s): Blade, and Forceps to remove Viable and Non-Viable tissue/material. Material removed includes Bone,Callus, Subcutaneous Tissue, and Skin: Epidermis after achieving pain control using Other (Benzocaine 20%). 1 specimen was taken by a Tissue Culture and sent to the lab per facility protocol. A time out was conducted at 15:40, prior to the start of the procedure. A Moderate amount of bleeding was controlled with Silver Nitrate. The procedure was tolerated well with a pain  level of 0 throughout and a pain level of 0 following the procedure. Post Debridement Measurements: 1.6cm length x 1.5cm width x 2cm depth; 3.77cm^3 volume. Character of Wound/Ulcer Post Debridement is improved. Severity of Tissue Post Debridement is: Fat layer exposed. Post procedure Diagnosis Wound #10: Same as Pre-Procedure Plan Follow-up Appointments: Return Appointment in 1 week. Dressing Change Frequency: Wound #10 Left,Lateral,Plantar Foot: Change dressing every day. Wound #9 Right,Medial Foot: Change dressing every day. Wound Cleansing: May shower and wash wound with soap and water. - on days that dressing is changed Primary Wound Dressing: Wound #10 Left,Lateral,Plantar Foot: Calcium Alginate with Silver Wound #9 Right,Medial Foot: Calcium Alginate with Silver Secondary Dressing: Wound #10 Left,Lateral,Plantar Foot: Kerlix/Rolled Gauze Dry Gauze Other: - foam donut Wound #9 Right,Medial Foot: Kerlix/Rolled Gauze Dry Gauze Other: - foam donut Off-Loading: Removable cast walker boot to: - right foot Laboratory ordered were: Anerobic culture - Bone - left plantar foot, Biopsy specimen culture - Bone - left plantar foot 1. Specimen of bone for pathology hopefully and CNS. 2. We will still use silver alginate on the left foot 3. Silver alginate on the right foot I am going to watch her walking out of the clinic again. I am somewhat fearful that if we put a total contact cast on the right leg she will not be able to walk. We will watch her together as a team today Electronic Signature(s) Signed: 03/04/2019 6:14:31 PM By: Levan Hurst RN, BSN Signed: 03/04/2019 6:16:05 PM By: Linton Ham MD Entered By: Levan Hurst on 03/04/2019 17:57:10 -------------------------------------------------------------------------------- SuperBill Details Patient Name: Date of Service: [name] 03/04/2019 Medical Record [id number] Patient Account Number:  [id number] Date of Birth/Sex: Treating RN: 07-19-1955 (64 y.o. [name] Primary Care Provider: Chevis Pretty Other Clinician: Referring Provider: Treating Provider/Extender:Yareni Creps, [name], [facility] Weeks in Treatment: 5 Diagnosis Coding ICD-10 Codes Code Description E11.621 Type 2 diabetes mellitus with foot ulcer L97.524 Non-pressure chronic ulcer of other part of left foot with necrosis of bone L97.511 Non-pressure chronic ulcer of other part of right foot limited to breakdown of skin L97.514 Non-pressure chronic ulcer of other part of right foot with necrosis of bone E11.42 Type 2 diabetes mellitus with diabetic polyneuropathy M14.671 Charcot's joint, right ankle and foot M14.672 Charcot's joint, left ankle and foot Facility Procedures CPT4 Code Description: [id number] - DEB BONE 20 SQ CM/< ICD-10 Diagnosis Description L97.524 Non-pressure chronic ulcer of other part of left foot wit Modifier: h necrosis of bo Quantity: 1 ne Physician Procedures CPT4: Code Description Modifier Quantity [id number] Debridement; bone (includes epidermis, dermis, subQ tissue, muscle and/or 1 fascia, if performed) 1st 20 sqcm or less ICD-10 Diagnosis Description L97.524 Non-pressure chronic ulcer of other part of left  foot with necrosis of bone Electronic Signature(s) Signed: 03/04/2019 6:16:05 PM By: Baltazar Najjar MD Entered By: Baltazar Najjar on 03/04/2019 16:02:27

## 2019-03-04 NOTE — Progress Notes (Signed)
[facility] Attending  Case discussed with Dr. Maryla Morrow at the time of the visit.  We reviewed the resident's history and exam and pertinent patient test results.  I agree with the assessment, diagnosis, and plan of care documented in the resident's note.  Unfortunately, MRI shows osteomyelitis. Will need antibiotic therapy, ok to hold off for now given possible surgical intervention or biopsy at upcoming podiatry appointment. CRP is elevated and could be used to help assess clinical response to therapy. Diabetes appears well controlled at this time.  Jessy Oto, M.D., Ph.D.

## 2019-03-05 ENCOUNTER — Telehealth: Payer: Self-pay | Admitting: Internal Medicine

## 2019-03-05 NOTE — Telephone Encounter (Addendum)
I called [name] office and talked to clinical care manager /clinical nurse about [name] and the plan for antibiotic therapy for osteomyelitis. Bone biopsy has been done by [name], but antibiotic therapy is pending.  [name] or his team will call me back for treatment plan as she will need to be started on antibiotic. I appreciate their care.  ADDENDUM: [name] kindly me and the plan is waiting for bone biopsy and he will talk to ID about antibiotic choice (likley IV per his evaluation).

## 2019-03-06 ENCOUNTER — Telehealth: Payer: Self-pay | Admitting: *Deleted

## 2019-03-06 NOTE — Telephone Encounter (Signed)
[facility] at [facility] calls for dr [facility] and attending to review culture from 2/15- aerobic/anaerobic.

## 2019-03-06 NOTE — Telephone Encounter (Signed)
GNR noted, Dr Maryla Morrow has been in contact with Dr Leanord Hawking who plans to start directed antibiotic therapy based on culture results.  No acute intervention needed, message has been forwarded to Dr Leanord Hawking.

## 2019-03-07 ENCOUNTER — Encounter: Payer: Medicare Other | Admitting: Internal Medicine

## 2019-03-08 NOTE — Telephone Encounter (Signed)
Thank you Dr. Maryla Morrow!

## 2019-03-08 NOTE — Telephone Encounter (Signed)
Aerobic/Anaerobic Culture (surgical/deep wound) Order: [id number] Status:  Preliminary result Visible to patient:  No (not released) Next appt:  03/11/2019 at 03:15 PM in Wound Care (Terald Sleeper, MD) Specimen Information: Bone     Component 4 d ago  Specimen Description BONE LEFT FOOT   Special Requests PLANTAR   Gram Stain NO WBC SEEN  NO ORGANISMS SEEN   Culture FEW CITROBACTER KOSERI  FEW STREPTOCOCCUS MITIS/ORALIS  FEW STREPTOCOCCUS ANGINOSIS  NO ANAEROBES ISOLATED; CULTURE IN PROGRESS FOR 5 DAYS        Received call from [place] with Microbiology lab reporting above critical result. Forwarding to PCP and Attending Pool. Kinnie Feil, BSN, RN-BC

## 2019-03-08 NOTE — Telephone Encounter (Signed)
Thank you. Can you please have them page [name], the ordering provider? Looks like this was from a bone biopsy.

## 2019-03-09 LAB — AEROBIC/ANAEROBIC CULTURE W GRAM STAIN (SURGICAL/DEEP WOUND): Gram Stain: NONE SEEN

## 2019-03-11 ENCOUNTER — Encounter (HOSPITAL_BASED_OUTPATIENT_CLINIC_OR_DEPARTMENT_OTHER): Payer: Medicare Other | Admitting: Internal Medicine

## 2019-03-11 ENCOUNTER — Other Ambulatory Visit: Payer: Self-pay

## 2019-03-11 DIAGNOSIS — E11621 Type 2 diabetes mellitus with foot ulcer: Secondary | ICD-10-CM | POA: Diagnosis not present

## 2019-03-12 ENCOUNTER — Telehealth: Payer: Self-pay | Admitting: Internal Medicine

## 2019-03-12 NOTE — Telephone Encounter (Signed)
Opened by error.

## 2019-03-13 ENCOUNTER — Other Ambulatory Visit: Payer: Self-pay

## 2019-03-13 ENCOUNTER — Encounter (HOSPITAL_BASED_OUTPATIENT_CLINIC_OR_DEPARTMENT_OTHER): Payer: Medicare Other | Admitting: Physician Assistant

## 2019-03-13 DIAGNOSIS — E11621 Type 2 diabetes mellitus with foot ulcer: Secondary | ICD-10-CM | POA: Diagnosis not present

## 2019-03-13 NOTE — Progress Notes (Addendum)
VALKYRIE, GUARDIOLA ([id number]) Visit Report for 03/13/2019 Chief Complaint Document Details Patient Name: Date of Service: [name], GROUND 03/13/2019 3:45 PM Medical Record [id number] Patient Account Number: [id number] Date of Birth/Sex: Treating RN: 20-May-1955 (64 y.o. [name] Primary Care Provider: Chevis Pretty Other Clinician: Referring Provider: Treating Provider/Extender:Stone III, [name], Sarah Weeks in Treatment: 6 Information Obtained from: Patient Chief Complaint Patients presents for follow up of a diabetic ulcer 01/28/2019; patient is here for review of diabetic foot ulcers bilaterally Electronic Signature(s) Signed: 03/13/2019 5:02:45 PM By: Lenda Kelp PA-C Entered By: Lenda Kelp on 03/13/2019 17:02:45 -------------------------------------------------------------------------------- HPI Details Patient Name: Date of Service: [facility] 03/13/2019 3:45 PM Medical Record [id number] Patient Account Number: [id number] Date of Birth/Sex: Treating RN: 04-22-1955 (63 y.o. [name] Primary Care Provider: Chevis Pretty Other Clinician: Referring Provider: Treating Provider/Extender:Stone III, [name], Sarah Weeks in Treatment: 6 History of Present Illness HPI Description: 65 yrs old bf here for follow up recurrent left charcot foot ulcer. She has DM. She is not a smoker. She states a blister developed 3 months ago at site of previous breakdown from 1 year ago. It was debrided at the podiatrist's office. She went to the ER and then sent here. She denies pain. Xray was negative for osteo. Culture from this clinic negative. 09/08/14 Referred by Dr. Kelly [name] to Dr. Victorino [name] for Charcot foot. Seen by him and MRI scheduled 09/19/14. Prior silver collagen but this was not ordered last visit, patient has been rinsing with saline alone. Re institute silver collagen every other day. F/u 2 weeks with  [name] as Labor Day holiday. Supplies ordered. 09/25/14; this is a patient with a Charcot foot on the left. she has a wound over the plantar aspect of her left calcaneus. She has been seen by Dr. Victorino [name] of orthopedic surgery and has had an MRI. She is apparently going within the next week or 2 for corrective surgery which will involve an external fixator. I don't have any of the details of this. 10/06/14; The patient is a 64 yrs old bf with a left Charcot foot and ulcer on the plantar. 12/01/14 Returns post surgery with Dr. Victorino [name]. Has follow up visit later this week with him, currently in facility and [facility] over this extremity. States no drainage from foot and no current wound care. On exam callus and scab in place, suspect healed. Has external fixator in place. Will defer to Dr. Victorino [name] for management, ok to place moisturizing cream over scabs and callus and she will call if she requires follow up here. READMISSION 01/28/2019 Patient was in the clinic in 2016 for a wound on the plantar aspect of her left calcaneus. Predominantly cared for by Dr. Ulice [name] she was referred to Dr. Victorino [name] and had corrective surgery for the position of her left ankle I believe. She had previously been here in 2015 and then prior to that in 2011 2012 that I do not have these records. More recently she has developed fairly extensive wounds on the right medial foot, left lateral foot and a small area on the right medial great toe. Right medial foot wound has been there for 6 to 7 months, left foot wound for 2 to 3 months and the right great toe only over the last few weeks. She has been followed by [name] at Dr. Laverta [place] office it sounds as though she has been undergoing callus paring and silver nitrate. The patient has been washing these off with  soap and water and plan applying dry dressing Past medical history, type 2 diabetes well controlled with a recent hemoglobin A1c of 7 on 11/16, type 2 diabetes with  peripheral neuropathy, previous left Charcot joint surgery bilateral Charcot joints currently, stage III chronic renal failure, hypertension, obstructive sleep apnea, history of MRSA and gastroesophageal reflux. 1/18; this is a patient with bilateral Charcot feet. Plain x-rays that I did of both of these wounds that are either at bone or precariously close to bone were done. On the right foot this showed possible lytic destruction involving the anterior aspect of the talus which may represent a wound osteomyelitis. An MRI was recommended. On the left there was no definite lytic destruction although the wound is deep undermining's and I think probably requires an MRI on this basis in any case 1/25; patient was supposed to have her MRI last Friday of her bilateral feet however they would not do it because there was silver alginate in her dressings, will replace with calcium alginate today and will see if we can get her done today 2/1; patient did not get her MRIs done last week because of issues with the MRI department receiving orders to do bilateral feet. She has 2 deep wounds in the setting of Charcot feet deformities bilaterally. Both of them at one point had exposed bone although I had trouble demonstrating that today. I am not sure that we can offload these very aggressively as I watched her leave the clinic last week her gait is very narrow and unsteady. 2/15; the MRI of her right foot did not show osteomyelitis potentially osteonecrosis of the talus. 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 The wounds are a large wound on the right medial foot and on the left a substantial wound on the left lateral plantar calcaneus. We have been using silver alginate. Ultimately I think we need to consider a total contact cast on the right while we work through the possibility of osteomyelitis in the left. I watched her walk out with her crutches I have some  trepidation about the ability to walk with the cast on her right foot. Nevertheless with her Charcot deformities I do not think there is a way to heal these short of offloading them aggressively. 2/22; the culture of the bone in the left foot that I did last week showed a few Citrobacter Koseri as well as some streptococcal species. In my attempted bone for pathology did not actually yield a lot of bone the pathologist did not identify any osteomyelitis. 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 Is promised today and aggressive debridement of the right foot and a total contact cast which surprisingly she seemed to tolerate quite well 03/13/2019 upon evaluation today patient appears to be doing well with her total contact cast she is here for the first cast change. She had no areas of rubbing or any other complications at this point. Overall things are doing quite well. Electronic Signature(s) Signed: 03/13/2019 5:36:53 PM By: Worthy Keeler PA-C Entered By: Worthy Keeler on 03/13/2019 17:36:53 -------------------------------------------------------------------------------- Physical Exam Details Patient Name: Date of Service: [name], [name] 03/13/2019 3:45 PM Medical Record [id number] Patient Account Number: [id number] Date of Birth/Sex: Treating RN: Nov 14, 1955 (64 y.o. Elam Dutch Primary Care Provider: Dewayne Hatch Other Clinician:  Referring Provider: Treating Provider/Extender:Stone III, [name], Sarah Weeks in Treatment: 6 Constitutional Well-nourished and well-hydrated in no acute distress. Respiratory normal breathing without difficulty. Psychiatric this patient is able to make decisions and demonstrates good insight into disease process. Alert and Oriented x 3. pleasant and  cooperative. Notes Patient's wound bed actually appeared to be doing well and we went ahead and did reapply the total contact cast that she had no complications with this patient tolerated that without complication today she was monitored for 15 minutes to allow this to dry and then discharged. We will see her back on Monday for her reevaluation. Electronic Signature(s) Signed: 03/13/2019 5:38:01 PM By: Lenda Kelp PA-C Entered By: Lenda Kelp on 03/13/2019 17:38:01 -------------------------------------------------------------------------------- Physician Orders Details Patient Name: Date of Service: [facility] 03/13/2019 3:45 PM Medical Record [id number] Patient Account Number: [id number] Date of Birth/Sex: Treating RN: 22-Dec-1955 (63 y.o. [name] Primary Care Provider: Chevis Pretty Other Clinician: Referring Provider: Treating Provider/Extender:Stone III, [name], Sarah Weeks in Treatment: 6 Verbal / Phone Orders: No Diagnosis Coding ICD-10 Coding Code Description E11.621 Type 2 diabetes mellitus with foot ulcer L97.524 Non-pressure chronic ulcer of other part of left foot with necrosis of bone L97.511 Non-pressure chronic ulcer of other part of right foot limited to breakdown of skin L97.514 Non-pressure chronic ulcer of other part of right foot with necrosis of bone E11.42 Type 2 diabetes mellitus with diabetic polyneuropathy M14.671 Charcot's joint, right ankle and foot M14.672 Charcot's joint, left ankle and foot Follow-up Appointments Return Appointment in 1 week. - Monday Dressing Change Frequency Wound #10 Left,Lateral,Plantar Foot Change Dressing every other day. Wound #9 Right,Medial Foot Do not change entire dressing for one week. Wound Cleansing May shower with protection. Primary Wound Dressing Wound #10 Left,Lateral,Plantar Foot Calcium Alginate with Silver - lightly pack Wound #9 Right,Medial Foot Calcium  Alginate with Silver Secondary Dressing Wound #10 Left,Lateral,Plantar Foot Kerlix/Rolled Gauze Dry Gauze Other: - foam donut Wound #9 Right,Medial Foot Foam Kerlix/Rolled Gauze Drawtex Off-Loading Total Contact Cast to Right Lower Extremity Electronic Signature(s) Signed: 03/13/2019 5:55:18 PM By: Lenda Kelp PA-C Signed: 03/13/2019 6:08:21 PM By: Zenaida Deed RN, BSN Entered By: Zenaida Deed on 03/13/2019 17:15:02 -------------------------------------------------------------------------------- Problem List Details Patient Name: Date of Service: Sarah Jaeger. 03/13/2019 3:45 PM Medical Record [id number] Patient Account Number: [id number] Date of Birth/Sex: Treating RN: 05-04-1955 (64 y.o. [name] Primary Care Provider: Chevis Pretty Other Clinician: Referring Provider: Treating Provider/Extender:Stone III, [name], Sarah Weeks in Treatment: 6 Active Problems ICD-10 Evaluated Encounter Code Description Active Date Today Diagnosis E11.621 Type 2 diabetes mellitus with foot ulcer 01/28/2019 No Yes L97.524 Non-pressure chronic ulcer of other part of left foot 01/28/2019 No Yes with necrosis of bone L97.511 Non-pressure chronic ulcer of other part of right foot 01/28/2019 No Yes limited to breakdown of skin L97.514 Non-pressure chronic ulcer of other part of right foot 01/28/2019 No Yes with necrosis of bone E11.42 Type 2 diabetes mellitus with diabetic polyneuropathy 01/28/2019 No Yes M14.671 Charcot's joint, right ankle and foot 01/28/2019 No Yes M14.672 Charcot's joint, left ankle and foot 01/28/2019 No Yes Inactive Problems Resolved Problems Electronic Signature(s) Signed: 03/13/2019 5:02:40 PM By: Lenda Kelp PA-C Entered By: Lenda Kelp on 03/13/2019 17:02:40 -------------------------------------------------------------------------------- Progress Note Details Patient Name: Date of Service: [facility]  03/13/2019 3:45 PM Medical Record [id number] Patient Account Number: [id number] Date of Birth/Sex: Treating RN: 05-22-1955 (63 y.o. [name] Primary Care Provider: Chevis Pretty  Other Clinician: Referring Provider: Treating Provider/Extender:Stone III, [name], Sarah Weeks in Treatment: 6 Subjective Chief Complaint Information obtained from Patient Patients presents for follow up of a diabetic ulcer 01/28/2019; patient is here for review of diabetic foot ulcers bilaterally History of Present Illness (HPI) 64 yrs old bf here for follow up recurrent left charcot foot ulcer. She has DM. She is not a smoker. She states a blister developed 3 months ago at site of previous breakdown from 1 year ago. It was debrided at the podiatrist's office. She went to the ER and then sent here. She denies pain. Xray was negative for osteo. Culture from this clinic negative. 09/08/14 Referred by Dr. Kelly [name] to Dr. Victorino [name] for Charcot foot. Seen by him and MRI scheduled 09/19/14. Prior silver collagen but this was not ordered last visit, patient has been rinsing with saline alone. Re institute silver collagen every other day. F/u 2 weeks with [name] as Labor Day holiday. Supplies ordered. 09/25/14; this is a patient with a Charcot foot on the left. she has a wound over the plantar aspect of her left calcaneus. She has been seen by Dr. Victorino [name] of orthopedic surgery and has had an MRI. She is apparently going within the next week or 2 for corrective surgery which will involve an external fixator. I don't have any of the details of this. 10/06/14; The patient is a 64 yrs old bf with a left Charcot foot and ulcer on the plantar. 12/01/14 Returns post surgery with Dr. Victorino [name]. Has follow up visit later this week with him, currently in facility and [facility] over this extremity. States no drainage from foot and no current wound care. On exam callus and scab in place, suspect healed. Has  external fixator in place. Will defer to Dr. Victorino [name] for management, ok to place moisturizing cream over scabs and callus and she will call if she requires follow up here. READMISSION 01/28/2019 Patient was in the clinic in 2016 for a wound on the plantar aspect of her left calcaneus. Predominantly cared for by Dr. Ulice [name] she was referred to Dr. Victorino [name] and had corrective surgery for the position of her left ankle I believe. She had previously been here in 2015 and then prior to that in 2011 2012 that I do not have these records. More recently she has developed fairly extensive wounds on the right medial foot, left lateral foot and a small area on the right medial great toe. Right medial foot wound has been there for 6 to 7 months, left foot wound for 2 to 3 months and the right great toe only over the last few weeks. She has been followed by [name] at Dr. Laverta [place] office it sounds as though she has been undergoing callus paring and silver nitrate. The patient has been washing these off with soap and water and plan applying dry dressing Past medical history, type 2 diabetes well controlled with a recent hemoglobin A1c of 7 on 11/16, type 2 diabetes with peripheral neuropathy, previous left Charcot joint surgery bilateral Charcot joints currently, stage III chronic renal failure, hypertension, obstructive sleep apnea, history of MRSA and gastroesophageal reflux. 1/18; this is a patient with bilateral Charcot feet. Plain x-rays that I did of both of these wounds that are either at bone or precariously close to bone were done. On the right foot this showed possible lytic destruction involving the anterior aspect of the talus which may represent a wound osteomyelitis. An MRI was recommended. On the left there  was no definite lytic destruction although the wound is deep undermining's and I think probably requires an MRI on this basis in any case 1/25; patient was supposed to have her MRI last  Friday of her bilateral feet however they would not do it because there was silver alginate in her dressings, will replace with calcium alginate today and will see if we can get her done today 2/1; patient did not get her MRIs done last week because of issues with the MRI department receiving orders to do bilateral feet. She has 2 deep wounds in the setting of Charcot feet deformities bilaterally. Both of them at one point had exposed bone although I had trouble demonstrating that today. I am not sure that we can offload these very aggressively as I watched her leave the clinic last week her gait is very narrow and unsteady. 2/15; the MRI of her right foot did not show osteomyelitis potentially osteonecrosis of the talus. 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 The wounds are a large wound on the right medial foot and on the left a substantial wound on the left lateral plantar calcaneus. We have been using silver alginate. Ultimately I think we need to consider a total contact cast on the right while we work through the possibility of osteomyelitis in the left. I watched her walk out with her crutches I have some trepidation about the ability to walk with the cast on her right foot. Nevertheless with her Charcot deformities I do not think there is a way to heal these short of offloading them aggressively. 2/22; the culture of the bone in the left foot that I did last week showed a few Citrobacter Koseri as well as some streptococcal species. In my attempted bone for pathology did not actually yield a lot of bone the pathologist did not identify any osteomyelitis. 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 Is promised today and aggressive debridement of the right foot and a total contact cast which  surprisingly she seemed to tolerate quite well 03/13/2019 upon evaluation today patient appears to be doing well with her total contact cast she is here for the first cast change. She had no areas of rubbing or any other complications at this point. Overall things are doing quite well. Objective Constitutional Well-nourished and well-hydrated in no acute distress. Vitals Time Taken: 4:33 PM, Height: 68 in, Weight: 265 lbs, BMI: 40.3, Temperature: 98.4 F, Pulse: 75 bpm, Respiratory Rate: 18 breaths/min, Blood Pressure: 140/57 mmHg. Respiratory normal breathing without difficulty. Psychiatric this patient is able to make decisions and demonstrates good insight into disease process. Alert and Oriented x 3. pleasant and cooperative. General Notes: Patient's wound bed actually appeared to be doing well and we went ahead and did reapply the total contact cast that she had no complications with this patient tolerated that without complication today she was monitored for 15 minutes to allow this to dry and then discharged. We will see her back on Monday for her reevaluation. Integumentary (Hair, Skin) Wound #9 status is Open. Original cause of wound was Blister. The wound is located on the Right,Medial Foot. The wound measures 3.8cm length x 3.1cm width x 0.4cm depth; 9.252cm^2 area and 3.701cm^3 volume. There is Fat Layer (Subcutaneous Tissue) Exposed exposed. There is no tunneling or undermining  noted. There is a medium amount of serosanguineous drainage noted. The wound margin is thickened. There is large (67-100%) red, pink granulation within the wound bed. There is no necrotic tissue within the wound bed. Assessment Active Problems ICD-10 Type 2 diabetes mellitus with foot ulcer Non-pressure chronic ulcer of other part of left foot with necrosis of bone Non-pressure chronic ulcer of other part of right foot limited to breakdown of skin Non-pressure chronic ulcer of other part of right  foot with necrosis of bone Type 2 diabetes mellitus with diabetic polyneuropathy Charcot's joint, right ankle and foot Charcot's joint, left ankle and foot Procedures Wound #9 Pre-procedure diagnosis of Wound #9 is a Diabetic Wound/Ulcer of the Lower Extremity located on the Right,Medial Foot . There was a Total Contact Cast Procedure by Lenda Kelp, PA. Post procedure Diagnosis Wound #9: Same as Pre-Procedure Plan Follow-up Appointments: Return Appointment in 1 week. - Monday Dressing Change Frequency: Wound #10 Left,Lateral,Plantar Foot: Change Dressing every other day. Wound #9 Right,Medial Foot: Do not change entire dressing for one week. Wound Cleansing: May shower with protection. Primary Wound Dressing: Wound #10 Left,Lateral,Plantar Foot: Calcium Alginate with Silver - lightly pack Wound #9 Right,Medial Foot: Calcium Alginate with Silver Secondary Dressing: Wound #10 Left,Lateral,Plantar Foot: Kerlix/Rolled Gauze Dry Gauze Other: - foam donut Wound #9 Right,Medial Foot: Foam Kerlix/Rolled Gauze Drawtex Off-Loading: Total Contact Cast to Right Lower Extremity 1. I would recommend that we continue with the current wound care measures that she seems to have done well this included going ahead and reapplying the silver alginate dressing lightly packed into the wound. This was followed by application of the total contact cast which was applied by myself today. 2. I recommend that she continue to keep this cast clean and dry if anything does get wet such as getting caught in the rain storm she should contact the office and let me know. We will see patient back for reevaluation in 1 week here in the clinic. If anything worsens or changes patient will contact our office for additional recommendations. Electronic Signature(s) Signed: 03/13/2019 5:38:40 PM By: Lenda Kelp PA-C Entered By: Lenda Kelp on 03/13/2019  17:38:39 -------------------------------------------------------------------------------- Total Contact Cast Details Patient Name: Date of Service: [name], [name] 03/13/2019 3:45 PM Medical Record [id number] Patient Account Number: [id number] Date of Birth/Sex: Treating RN: [birth year] (63 y.o. [name] Primary Care Provider: Chevis Pretty Other Clinician: Referring Provider: Chevis Pretty Treating Provider/Extender:Stone III, Jake Samples in Treatment: 6 Total Contact Cast Applied for Wound Assessment: Wound #9 Right,Medial Foot Performed By: Physician Lenda Kelp, PA Post Procedure Diagnosis Same as Pre-procedure Electronic Signature(s) Signed: 03/13/2019 5:55:18 PM By: Lenda Kelp PA-C Signed: 03/13/2019 6:08:21 PM By: Zenaida Deed RN, BSN Entered By: Zenaida Deed on 03/13/2019 17:16:46 -------------------------------------------------------------------------------- SuperBill Details Patient Name: Date of Service: [name], [name] 03/13/2019 Medical Record [id number] Patient Account Number: [id number] Date of Birth/Sex: Treating RN: [birth year] (63 y.o. [name] Primary Care Provider: Chevis Pretty Other Clinician: Referring Provider: Treating Provider/Extender:Stone III, [name], Sarah Weeks in Treatment: 6 Diagnosis Coding ICD-10 Codes Code Description E11.621 Type 2 diabetes mellitus with foot ulcer L97.524 Non-pressure chronic ulcer of other part of left foot with necrosis of bone L97.511 Non-pressure chronic ulcer of other part of right foot limited to breakdown of skin L97.514 Non-pressure chronic ulcer of other part of right foot with necrosis of bone E11.42 Type 2 diabetes mellitus with diabetic polyneuropathy M14.671 Charcot's joint, right ankle and foot M14.672 Charcot's  joint, left ankle and foot Facility Procedures CPT4 Code Description: [id number] - APPLY TOTAL CONTACT LEG  CAST ICD-10 Diagnosis Description L97.514 Non-pressure chronic ulcer of other part of right foot wi Modifier: th necrosis of Quantity: 1 bone Physician Procedures CPT4 Code Description: [id number] - WC PHYS APPLY TOTAL CONTACT CAST ICD-10 Diagnosis Description L97.514 Non-pressure chronic ulcer of other part of right foot wit Modifier: h necrosis of Quantity: 1 bone Electronic Signature(s) Signed: 03/13/2019 5:39:02 PM By: Lenda Kelp PA-C Entered By: Lenda Kelp on 03/13/2019 17:39:01

## 2019-03-14 ENCOUNTER — Telehealth: Payer: Self-pay | Admitting: Internal Medicine

## 2019-03-14 ENCOUNTER — Encounter: Payer: Self-pay | Admitting: Internal Medicine

## 2019-03-14 ENCOUNTER — Telehealth: Payer: Self-pay | Admitting: *Deleted

## 2019-03-14 ENCOUNTER — Ambulatory Visit (INDEPENDENT_AMBULATORY_CARE_PROVIDER_SITE_OTHER): Payer: Medicare Other | Admitting: Internal Medicine

## 2019-03-14 DIAGNOSIS — M86072 Acute hematogenous osteomyelitis, left ankle and foot: Secondary | ICD-10-CM | POA: Diagnosis present

## 2019-03-14 MED ORDER — METRONIDAZOLE 500 MG PO TABS: 500.0000 mg | ORAL_TABLET | Freq: Three times a day (TID) | ORAL | 1 refills | Status: DC

## 2019-03-14 MED ORDER — DEXTROSE 5 % IV SOLN: 2.0000 g | INTRAVENOUS | Status: DC

## 2019-03-14 NOTE — Progress Notes (Signed)
[name], [name] ([id number]) Visit Report for 03/04/2019 Arrival Information Details Patient Name: Date of Service: [name], [name] 03/04/2019 1:30 PM Medical Record [id number] Patient Account Number: [id number] Date of Birth/Sex: Treating RN: 1955/10/23 (64 y.o. [name] Primary Care Muriel Hannold: Chevis Pretty Other Clinician: Referring Imani Sherrin: Treating Eliav Mechling/Extender:Robson, [name], [facility] Weeks in Treatment: 5 Visit Information History Since Last Visit All ordered tests and consults were completed: No Patient Arrived: Crutches Added or deleted any medications: No Arrival Time: 14:53 Any new allergies or adverse reactions: No Accompanied By: self Had a fall or experienced change in No Transfer Assistance: None activities of daily living that may affect Patient Identification Verified: Yes risk of falls: Secondary Verification Process Completed: Yes Signs or symptoms of abuse/neglect since last No Patient Requires Transmission-Based No visito Precautions: Hospitalized since last visit: No Patient Has Alerts: No Implantable device outside of the clinic excluding No cellular tissue based products placed in the center since last visit: Has Dressing in Place as Prescribed: Yes Pain Present Now: No Electronic Signature(s) Signed: 03/06/2019 5:45:19 PM By: Yevonne Pax RN Entered By: Yevonne Pax on 03/04/2019 15:08:23 -------------------------------------------------------------------------------- Encounter Discharge Information Details Patient Name: Date of Service: Sarah Jaeger. 03/04/2019 1:30 PM Medical Record [id number] Patient Account Number: [id number] Date of Birth/Sex: Treating RN: April 04, 1955 (64 y.o. [name] Primary Care Kroy Sprung: Chevis Pretty Other Clinician: Referring Ceniyah Thorp: Treating Jahniya Duzan/Extender:Robson, [name], [facility] Weeks in Treatment: 5 Encounter Discharge  Information Items Post Procedure Vitals Discharge Condition: Stable Temperature (F): 98.3 Ambulatory Status: Crutches Pulse (bpm): 69 Discharge Destination: Home Respiratory Rate (breaths/min): 20 Transportation: Other Blood Pressure (mmHg): 149/80 Accompanied By: self Schedule Follow-up Appointment: Yes Clinical Summary of Care: Patient Declined Electronic Signature(s) Signed: 03/04/2019 6:12:41 PM By: [name] Entered By: [name] on 03/04/2019 16:02:50 -------------------------------------------------------------------------------- Lower Extremity Assessment Details Patient Name: Date of Service: [name], [name] 03/04/2019 1:30 PM Medical Record [id number] Patient Account Number: [id number] Date of Birth/Sex: Treating RN: 16-Feb-1955 (63 y.o. [name] Primary Care Yochanan Eddleman: Chevis Pretty Other Clinician: Referring [place]: Treating Montine Hight/Extender:Robson, [name], [facility] Weeks in Treatment: 5 Edema Assessment Assessed: [Left: No] [Right: No] Edema: [Left: Yes] [Right: Yes] Calf Left: Right: Point of Measurement: 43 cm From Medial Instep 37 cm 37 cm Ankle Left: Right: Point of Measurement: 12 cm From Medial Instep 24 cm 28.2 cm Electronic Signature(s) Signed: 03/06/2019 5:45:19 PM By: Yevonne Pax RN Entered By: Yevonne Pax on 03/04/2019 15:09:18 -------------------------------------------------------------------------------- Multi Wound Chart Details Patient Name: Date of Service: Sarah Jaeger. 03/04/2019 1:30 PM Medical Record [id number] Patient Account Number: [id number] Date of Birth/Sex: Treating RN: 10/16/1955 (63 y.o. 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] [9:Lower Extremity] Comorbid History: [10:Glaucoma, Chronic sinus Glaucoma, Chronic sinus N/A problems/congestion, Anemia, Sleep Apnea, Hypertension, Type II Diabetes, Rheumatoid Arthritis, Neuropathy] [9:problems/congestion, Anemia, Sleep Apnea, Hypertension, Type II  Diabetes, Rheumatoid Arthritis, Neuropathy] Date Acquired: [10:11/18/2018] [9:06/18/2018] [N/A:N/A] Weeks of Treatment: [10:5] [9:5] [N/A:N/A] Wound Status: [10:Open] [9:Open] [N/A:N/A] Measurements L x W x D 1.6x1.5x2 [9:4x3.5x0.6] [N/A:N/A] (cm) Area (cm) : [10:1.885] [9:10.996] [N/A:N/A] Volume (cm) : [  10:3.77] [9:6.597] [N/A:N/A] % Reduction in Area: [10:-7.20%] [9:0.00%] [N/A:N/A] % Reduction in Volume: -42.90% [9:33.30%] [N/A:N/A] Starting Position 1 12 (o'clock): Ending Position 1 [10:12] (o'clock): Maximum Distance 1 [10:1.6] (cm): Undermining: [10:Yes] [9:No] [N/A:N/A] Classification: [10:Grade 2] [9:Grade 2] [N/A:N/A] Exudate Amount: [10:Medium] [9:Medium] [N/A:N/A] Exudate Type: [10:Serosanguineous] [9:Serosanguineous] [N/A:N/A] Exudate Color: [10:red, brown] [9:red, brown] [N/A:N/A] Wound Margin: [10:Thickened] [9:Well defined, not attached N/A] Granulation Amount: [10:Large (67-100%)] [9:Large (67-100%)] [N/A:N/A] Granulation Quality: [10:Red] [9:Red, Pink] [N/A:N/A] Necrotic Amount: [10:Small (1-33%)] [9:None Present (0%)] [N/A:N/A] Exposed Structures: [10:Fat Layer (Subcutaneous Fat Layer (Subcutaneous N/A Tissue) Exposed: Yes Muscle: Yes Bone: Yes Fascia: No Tendon: No Joint: No] [9:Tissue) Exposed:  Yes Fascia: No Tendon: No Muscle: No Joint: No Bone: No] Epithelialization: [10:None] [9:Small (1-33%)] [N/A:N/A] Debridement: [10:Debridement - Excisional N/A] [N/A:N/A] Pre-procedure [10:15:40] [9:N/A] [N/A:N/A] Verification/Time Out Taken: Pain Control: [10:Other] [9:N/A] [N/A:N/A] Tissue Debrided: [10:Bone, Callus, Subcutaneous] [9:N/A] [N/A:N/A] Level: [10:Skin/Subcutaneous Tissue/Muscle/Bone] [9:N/A] [N/A:N/A] Debridement Area (sq cm):2.4 [9:N/A] [N/A:N/A] Instrument: [10:Blade, Forceps] [9:N/A] [N/A:N/A] Bleeding: [10:Moderate] [9:N/A] [N/A:N/A] Hemostasis Achieved: [10:Silver Nitrate] [9:N/A] [N/A:N/A] Procedural Pain: [10:0] [9:N/A] [N/A:N/A] Post Procedural Pain: [10:0] [9:N/A] [N/A:N/A] Debridement Treatment Procedure was tolerated [9:N/A] [N/A:N/A] Response: [10:well] Post Debridement [10:1.6x1.5x2] [9:N/A] [N/A:N/A] Measurements L x W x D (cm) Post Debridement [10:3.77] [9:N/A] [N/A:N/A] Volume: (cm) Procedures Performed: Debridement [9:N/A] [N/A:N/A] Treatment Notes Wound #10 (Left, Lateral, Plantar Foot) 1. Cleanse With Wound Cleanser 3. Primary Dressing Applied Calcium Alginate Ag 4. Secondary Dressing Dry Gauze Roll Gauze Foam 5. Secured With Tape Notes stockinette Wound #9 (Right, Medial Foot) 1. Cleanse With Wound Cleanser 3. Primary Dressing Applied Calcium Alginate Ag 4. Secondary Dressing Dry Gauze Roll Gauze Foam 5. Secured With Medical laboratory scientific officer) Signed: 03/04/2019 6:14:31 PM By: Levan Hurst RN, BSN Entered By: Levan Hurst on 03/04/2019 17:57:01 -------------------------------------------------------------------------------- Multi-Disciplinary Care Plan Details Patient Name: Date of Service: Sarah Libra. 03/04/2019 1:30 PM Medical Record [id number] Patient Account Number: [id number] Date of Birth/Sex: Treating RN: [birth year] (63 y.o. [name] Primary Care Chardae Mulkern: Dewayne Hatch Other Clinician: Referring Jourdan Durbin: Treating Rowene Suto/Extender:Robson, [name], [facility] Weeks in Treatment: 5 Active Inactive Wound/Skin Impairment Nursing Diagnoses: Impaired tissue integrity Knowledge deficit related to ulceration/compromised skin integrity Goals: Patient/caregiver will verbalize understanding of skin care regimen Date Initiated: 01/28/2019 Target Resolution Date: 03/29/2019 Goal Status: Active Interventions: Assess patient/caregiver ability to obtain necessary supplies Assess patient/caregiver ability to perform ulcer/skin care regimen upon admission and as needed Assess ulceration(s) every visit Provide education on ulcer and skin care Notes: Electronic Signature(s) Signed: 03/04/2019 6:14:31 PM By: Levan Hurst RN, BSN Entered By: Levan Hurst on 03/04/2019 15:18:40 -------------------------------------------------------------------------------- Pain Assessment Details Patient Name: Date of Service: Sarah Libra. 03/04/2019 1:30 PM Medical Record [id number] Patient Account Number: [id number] Date of Birth/Sex: Treating RN: 04-03-1955 (64 y.o. Orvan Falconer Primary Care Angelisse Riso: Dewayne Hatch Other Clinician: Referring Briyanna Billingham: Treating [name] Olivencia/Extender:Robson, [name], [facility] Weeks in Treatment: 5 Active Problems Location of Pain Severity and Description of Pain Patient Has Paino No Site Locations Pain Management and Medication Current Pain Management: Electronic Signature(s) Signed: 03/06/2019 5:45:19 PM By: Carlene Coria RN Entered By: Carlene Coria on 03/04/2019 15:09:11 -------------------------------------------------------------------------------- Patient/Caregiver Education Details Patient Name: Sarah Libra 2/15/2021andnbsp1:30 Date of Service: PM Medical Record [id number] Number: Patient Account Number: [id number] Date of Birth/Gender: [birth year] (63 y.o.  F) Treating RN: Levan Hurst Primary Care Other Clinician: Myrtie Hawk, Physician: Lidia Collum Physician/ExtenderMyrtie Hawk, Referring Physician: Tenna Delaine in Treatment: 5 Education Assessment Education Provided To: Patient Education Topics Provided Wound/Skin  Impairment: Methods: Explain/Verbal Responses: State content correctly Electronic Signature(s) Signed: 03/04/2019 6:14:31 PM By: Zandra Abts RN, BSN Entered By: Zandra Abts on 03/04/2019 15:18:51 -------------------------------------------------------------------------------- Wound Assessment Details Patient Name: Date of Service: Sarah Jaeger. 03/04/2019 1:30 PM Medical Record [id number] Patient Account Number: [id number] Date of Birth/Sex: Treating RN: Mar 19, 1955 (64 y.o. [name] Primary Care Joanne Salah: Chevis Pretty Other Clinician: Referring Yanessa Hocevar: Treating Evelean Bigler/Extender:Robson, [name], [facility] Weeks in Treatment: 5 Wound Status Wound Number: 10 Primary Diabetic Wound/Ulcer of the Lower Extremity Etiology: Wound Location: Left Foot - Plantar, Lateral Wound Open Wounding Event: Blister Status: Date Acquired: 11/18/2018 Comorbid Glaucoma, Chronic sinus Weeks Of Treatment: 5 History: problems/congestion, Anemia, Sleep Apnea, Clustered Wound: No Hypertension, Type II Diabetes, Rheumatoid Arthritis, Neuropathy Photos Wound Measurements Length: (cm) 1.6 % Reduction in Width: (cm) 1.5 % Reduction in Depth: (cm) 2 Epithelializat Area: (cm) 1.885 Tunneling: Volume: (cm) 3.77 Undermining: Starting Po Ending Posi Maximum Dis Area: -7.2% Volume: -42.9% ion: None No Yes sition (o'clock): 12 tion (o'clock): 12 tance: (cm) 1.6 Wound Description Classification: Grade 2 Foul Odor Afte Wound Margin: Thickened Slough/Fibrino Exudate Amount: Medium Exudate Type: Serosanguineous Exudate Color: red, brown Wound  Bed Granulation Amount: Large (67-100%) Granulation Quality: Red Fascia Exposed Necrotic Amount: Small (1-33%) Fat Layer (Sub Necrotic Quality: Adherent Slough Tendon Exposed Muscle Exposed Necrosis Joint Exposed: Bone Expos r Cleansing: No Yes Exposed Structure : No cutaneous Tissue) Exposed: Yes : No : Yes of Muscle: No No ed: Yes Electronic Signature(s) Signed: 03/05/2019 4:25:10 PM By: Benjaman Kindler EMT/HBOT Signed: 03/14/2019 8:56:47 AM By: Zandra Abts RN, BSN Previous Signature: 03/04/2019 6:14:31 PM Version By: Zandra Abts RN, BSN Entered By: Benjaman Kindler on 03/05/2019 14:21:22 -------------------------------------------------------------------------------- Wound Assessment Details Patient Name: Date of Service: Sarah Jaeger. 03/04/2019 1:30 PM Medical Record [id number] Patient Account Number: [id number] Date of Birth/Sex: Treating RN: February 25, 1955 (64 y.o. [name] Primary Care Gaylynn Seiple: Chevis Pretty Other Clinician: Referring Journii Nierman: Treating Anni Hocevar/Extender:Robson, [name], [facility] Weeks in Treatment: 5 Wound Status Wound Number: 9 Primary Diabetic Wound/Ulcer of the Lower Extremity Etiology: Wound Location: Right Foot - Medial Wound Open Wounding Event: Blister Status: Date Acquired: 06/18/2018 Comorbid Glaucoma, Chronic sinus Weeks Of Treatment: 5 History: problems/congestion, Anemia, Sleep Apnea, Clustered Wound: No Hypertension, Type II Diabetes, Rheumatoid Arthritis, Neuropathy Photos Wound Measurements Length: (cm) 4 Width: (cm) 3.5 Depth: (cm) 0.6 Area: (cm) 10.996 Volume: (cm) 6.597 Wound Description Classification: Grade 2 Wound Margin: Well defined, not attached Exudate Amount: Medium Exudate Type: Serosanguineous Exudate Color: red, brown Wound Bed Granulation Amount: Large (67-100%) Granulation Quality: Red, Pink Necrotic Amount: None Present (0%) After Cleansing: No rino  Yes Exposed Structure sed: No Subcutaneous Tissue) Exposed: Yes sed: No sed: No ed: No d: No % Reduction in Area: 0% % Reduction in Volume: 33.3% Epithelialization: Small (1-33%) Tunneling: No Undermining: No Foul Odor Slough/Fib Fascia Expo Fat Layer ( Tendon Expo Muscle Expo Joint Expos Bone Expose Electronic Signature(s) Signed: 03/05/2019 4:25:10 PM By: Benjaman Kindler EMT/HBOT Signed: 03/14/2019 8:56:47 AM By: Zandra Abts RN, BSN Entered By: Benjaman Kindler on 03/05/2019 14:21:02 -------------------------------------------------------------------------------- Vitals Details Patient Name: Date of Service: Sarah Jaeger. 03/04/2019 1:30 PM Medical Record [id number] Patient Account Number: [id number] Date of Birth/Sex: Treating RN: 04/03/1955 (64 y.o. Sharyne Peach, Carrie Primary Care Arpan Eskelson: Chevis Pretty Other Clinician: Referring Lacie Landry: Treating Nicosha Struve/Extender:Robson, [name], [facility] Weeks in Treatment: 5 Vital Signs Time Taken: 15:08 Temperature (F): 98.3 Height (in): 68 Pulse (bpm): 69 Weight (lbs): 265 Respiratory Rate (breaths/min): 20 Body  Mass Index (BMI): 40.3 Blood Pressure (mmHg): 149/80 Capillary Blood Glucose (mg/dl): 915 Reference Range: 80 - 120 mg / dl Notes CBG per patient Electronic Signature(s) Signed: 03/06/2019 5:45:19 PM By: Yevonne Pax RN Entered By: Yevonne Pax on 03/04/2019 15:09:04

## 2019-03-14 NOTE — Progress Notes (Signed)
[name], [name] ([id number]) Visit Report for 02/18/2019 Arrival Information Details Patient Name: Date of Service: [name], [name] 02/18/2019 3:15 PM Medical Record [id number] Patient Account Number: [id number] Date of Birth/Sex: Treating RN: May 02, 1955 (64 y.o. [name] Primary Care Emara Lichter: Chevis Pretty Other Clinician: Referring Yeila Morro: Treating Lavella Myren/Extender:Robson, [name], [facility] Weeks in Treatment: 3 Visit Information History Since Last Visit All ordered tests and consults were completed: No Patient Arrived: Crutches Added or deleted any medications: No Arrival Time: 16:27 Any new allergies or adverse reactions: No Accompanied By: self Had a fall or experienced change in No Transfer Assistance: None activities of daily living that may affect Patient Identification Verified: Yes risk of falls: Secondary Verification Process Completed: Yes Signs or symptoms of abuse/neglect since last No Patient Requires Transmission-Based No visito Precautions: Hospitalized since last visit: No Patient Has Alerts: No Implantable device outside of the clinic excluding No cellular tissue based products placed in the center since last visit: Has Dressing in Place as Prescribed: Yes Has Compression in Place as Prescribed: Yes Pain Present Now: No Electronic Signature(s) Signed: 02/19/2019 5:23:18 PM By: Yevonne Pax RN Entered By: Yevonne Pax on 02/18/2019 16:31:54 -------------------------------------------------------------------------------- Clinic Level of Care Assessment Details Patient Name: Date of Service: [name], [name] 02/18/2019 3:15 PM Medical Record [id number] Patient Account Number: [id number] Date of Birth/Sex: Treating RN: 03/20/1955 (64 y.o. [name] Primary Care Jasmond River: Chevis Pretty Other Clinician: Referring Sephira Zellman: Treating Yarithza Mink/Extender:Robson, [name], [facility] Weeks  in Treatment: 3 Clinic Level of Care Assessment Items TOOL 4 Quantity Score X - Use when only an EandM is performed on FOLLOW-UP visit 1 0 ASSESSMENTS - Nursing Assessment / Reassessment X - Reassessment of Co-morbidities (includes updates in patient status) 1 10 X - Reassessment of Adherence to Treatment Plan 1 5 ASSESSMENTS - Wound and Skin Assessment / Reassessment []  - Simple Wound Assessment / Reassessment - one wound 0 X - Complex Wound Assessment / Reassessment - multiple wounds 2 5 []  - Dermatologic / Skin Assessment (not related to wound area) 0 ASSESSMENTS - Focused Assessment []  - Circumferential Edema Measurements - multi extremities 0 []  - Nutritional Assessment / Counseling / Intervention 0 X - Lower Extremity Assessment (monofilament, tuning fork, pulses) 1 5 []  - Peripheral Arterial Disease Assessment (using hand held doppler) 0 ASSESSMENTS - Ostomy and/or Continence Assessment and Care []  - Incontinence Assessment and Management 0 []  - Ostomy Care Assessment and Management (repouching, etc.) 0 PROCESS - Coordination of Care X - Simple Patient / Family Education for ongoing care 1 15 []  - Complex (extensive) Patient / Family Education for ongoing care 0 X - Staff obtains , Records, Test Results / Process Orders 1 10 []  - Staff telephones HHA, Nursing Homes / Clarify orders / etc 0 []  - Routine Transfer to another Facility (non-emergent condition) 0 []  - Routine Hospital Admission (non-emergent condition) 0 []  - New Admissions / / Ordering NPWT, Apligraf, etc. 0 []  - Emergency Hospital Admission (emergent condition) 0 X - Simple Discharge Coordination 1 10 []  - Complex (extensive) Discharge Coordination 0 PROCESS - Special Needs []  - Pediatric / Minor Patient Management 0 []  - Isolation Patient Management 0 []  - Hearing / Language / Visual special needs 0 []  - Assessment of Community assistance (transportation, D/C planning, etc.)  0 []  - Additional assistance / Altered mentation 0 []  - Support Surface(s) Assessment (bed, cushion, seat, etc.) 0 INTERVENTIONS - Wound Cleansing / Measurement []  - Simple Wound Cleansing - one wound  0 X - Complex Wound Cleansing - multiple wounds 2 5 X - Wound Imaging (photographs - any number of wounds) 1 5 []  - Wound Tracing (instead of photographs) 0 []  - Simple Wound Measurement - one wound 0 X - Complex Wound Measurement - multiple wounds 2 5 INTERVENTIONS - Wound Dressings []  - Small Wound Dressing one or multiple wounds 0 X - Medium Wound Dressing one or multiple wounds 2 15 []  - Large Wound Dressing one or multiple wounds 0 X - Application of Medications - topical 1 5 []  - Application of Medications - injection 0 INTERVENTIONS - Miscellaneous []  - External ear exam 0 []  - Specimen Collection (cultures, biopsies, blood, body fluids, etc.) 0 []  - Specimen(s) / Culture(s) sent or taken to Lab for analysis 0 []  - Patient Transfer (multiple staff / / Similar devices) 0 []  - Simple Staple / Suture removal (25 or less) 0 []  - Complex Staple / Suture removal (26 or more) 0 []  - Hypo / Hyperglycemic Management (close monitor of Blood Glucose) 0 []  - Ankle / Brachial Index (ABI) - do not check if billed separately 0 X - Vital Signs 1 5 Has the patient been seen at the hospital within the last three years: Yes Total Score: 130 Level Of Care: New/Established - Level 4 Electronic Signature(s) Signed: 02/18/2019 6:39:44 PM By: RN, BSN Entered By: on 02/18/2019 18:35:56 -------------------------------------------------------------------------------- Encounter Discharge Information Details Patient Name: Date of Service: . 02/18/2019 3:15 PM Medical Record Patient Account Number: Date of Birth/Sex: Treating RN: 1955-07-18 (64 y.o. Primary Care [name]: Other  Clinician: Referring Lorree Millar: Treating [name]/Extender:Robson, , [facility] Weeks in Treatment: 3 Encounter Discharge Information Items Discharge Condition: Stable Ambulatory Status: Crutches Discharge Destination: Home Transportation: Private Auto Accompanied By: self Schedule Follow-up Appointment: Yes Clinical Summary of Care: Electronic Signature(s) Signed: 02/18/2019 6:01:21 PM By: Zandra Abts Entered By: 04/18/2019 on 02/18/2019 17:31:22 -------------------------------------------------------------------------------- Lower Extremity Assessment Details Patient Name: Date of Service: [name], [name] 02/18/2019 3:15 PM Medical Record [id number] Patient Account Number: 11/03/1955 Date of Birth/Sex: Treating RN: Feb 21, 1955 (63 y.o. Chevis Pretty Primary Care Nikoleta Dady: [name] Other Clinician: Referring Chanique Duca: Treating Crysten Kaman/Extender:Robson, [facility], [facility] Weeks in Treatment: 3 Edema Assessment Assessed: [Left: No] [Right: No] Edema: [Left: Yes] [Right: Yes] Calf Left: Right: Point of Measurement: 43 cm From Medial Instep 37 cm 37 cm Ankle Left: Right: Point of Measurement: 12 cm From Medial Instep 24 cm 28.2 cm Electronic Signature(s) Signed: 02/19/2019 5:23:18 PM By: Shawn Stall RN Entered By: 04/18/2019 on 02/18/2019 16:43:50 -------------------------------------------------------------------------------- Multi Wound Chart Details Patient Name: Date of Service: 04/18/2019. 02/18/2019 3:15 PM Medical Record [id number] Patient Account Number: 11/03/1955 Date of Birth/Sex: Treating RN: 09-Dec-1955 (63 y.o. 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] [11:Lower Extremity] [9:Lower Extremity] Comorbid History: [10:Glaucoma, Chronic sinus Glaucoma, Chronic sinus Glaucoma, Chronic sinus problems/congestion, Anemia, Sleep Apnea, Hypertension, Type II Diabetes, Rheumatoid Arthritis, Neuropathy] [11:problems/congestion, Anemia, Sleep Apnea,  Hypertension, Type II Diabetes, Rheumatoid  Arthritis, Neuropathy] [9:problems/congestion, Anemia, Sleep Apnea, Hypertension, Type II Diabetes, Rheumatoid Arthritis, Neuropathy] Date Acquired: [10:11/18/2018] [11:01/18/2019] [9:06/18/2018] Weeks of Treatment: [10:3] [11:3] [9:3] Wound Status: [10:Open] [11:Healed - Epithelialized] [9:Open] Measurements L x W x D 1.5x1.9x1.7 [11:0x0x0] [9:3.9x3x1] (cm) Area (cm) : [10:2.238] [11:0] [9:9.189] Volume (cm) : [10:3.805] [11:0] [9:9.189] % Reduction in Area: [10:-27.20%] [11:100.00%] [9:16.40%] % Reduction in Volume: -44.20% [11:100.00%] [9:7.10%] Classification: [10:Grade 2] [11:Grade 2] [9:Grade 2] Exudate Amount: [10:Small] [11:None Present] [9:Medium] Exudate Type: [10:Serosanguineous] [11:N/A] [9:Serosanguineous] Exudate Color: [10:red, brown] [11:N/A] [9:red, brown] Wound Margin: [10:Thickened] [11:Flat and Intact] [9:Well defined, not attached] Granulation Amount: [10:Large (67-100%)] [11:None Present (0%)] [9:Large (67-100%)] Granulation Quality: [10:Red] [11:N/A] [9:Red, Pink] Necrotic Amount: [10:Small (1-33%)] [11:None Present (0%)] [9:None Present (0%)] Exposed Structures: [10:Fat Layer (Subcutaneous Fascia: No Tissue) Exposed: Yes Muscle: Yes Bone: Yes Fascia: No Tendon: No  Joint: No None] [11:Fat Layer (Subcutaneous Tissue) Exposed: Yes Tissue) Exposed: No Tendon: No Muscle: No Joint: No Bone: No Large (67-100%)] [9:Fat  Layer (Subcutaneous Fascia: No Tendon: No Muscle: No Joint: No Bone: No Small (1-33%)] Treatment Notes Wound #10 (Left, Lateral, Plantar Foot) 1. Cleanse With Wound Cleanser Soap and water 3. Primary Dressing Applied Calcium Alginate 4. Secondary Dressing Dry Gauze Roll Gauze Foam 5. Secured With Medipore tape Notes walking boot right leg. Wound #9 (Right, Medial Foot) 1. Cleanse With Wound Cleanser Soap and water 3. Primary Dressing Applied Calcium Alginate 4. Secondary Dressing Dry Gauze Roll Gauze Foam 5. Secured With Medipore tape Notes walking boot right leg. Electronic Signature(s) Signed: 02/18/2019 6:41:12 PM By: Linton Ham MD Entered By: Linton Ham on 02/18/2019 18:19:20 -------------------------------------------------------------------------------- Multi-Disciplinary Care Plan Details Patient Name: Date of Service: [facility] 02/18/2019 3:15 PM Medical Record [id number] Patient Account Number: [id number] Date of Birth/Sex: Treating RN: Jun 08, 1955 (63 y.o. [name] Primary Care Roald Lukacs: Dewayne Hatch Other Clinician: Referring Freddye Cardamone: Treating Edy Belt/Extender:Robson, [name], [facility] Weeks in Treatment: 3 Active Inactive Abuse / Safety / Falls / Self Care Management Nursing Diagnoses: Potential for falls Potential for injury related to falls Goals: Patient will not experience any injury related to falls Date Initiated: 01/28/2019 Target Resolution Date: 03/01/2019 Goal Status: Active Patient/caregiver will verbalize/demonstrate measures taken to improve the patient's personal safety Date Initiated: 01/28/2019 Target Resolution Date: 03/01/2019 Goal Status: Active Patient/caregiver will verbalize/demonstrate measures taken to prevent injury and/or  falls Date Initiated: 01/28/2019 Target Resolution Date: 03/01/2019 Goal Status: Active Interventions: Assess Activities of Daily Living upon admission and as needed Assess fall risk on admission and as needed Assess: immobility, friction, shearing, incontinence upon admission and as needed Assess impairment of mobility on admission and as needed per policy Assess personal safety and home safety (as indicated) on admission and as needed Assess self care needs on admission and as needed Provide education on fall prevention Provide education on personal and home safety Notes: Nutrition Nursing Diagnoses: Impaired glucose control: actual or potential Potential for alteratiion in Nutrition/Potential for imbalanced nutrition Goals: Patient/caregiver agrees to and verbalizes understanding of need to use nutritional supplements and/or vitamins as prescribed Date Initiated: 01/28/2019 Target Resolution Date: 03/01/2019 Goal Status: Active Patient/caregiver verbalizes understanding of need to maintain therapeutic glucose control per primary care physician Date Initiated: 01/28/2019 Target Resolution Date: 03/01/2019 Goal Status: Active Interventions: Assess HgA1c results as ordered upon admission and as needed Assess patient nutrition upon admission and as needed per policy Provide education on elevated blood sugars and impact on wound healing Provide education on nutrition Treatment Activities: Education provided on Nutrition :  01/28/2019 Notes: Wound/Skin Impairment Nursing Diagnoses: Impaired tissue integrity Knowledge deficit related to ulceration/compromised skin integrity Goals: Patient/caregiver will verbalize understanding of skin care regimen Date Initiated: 01/28/2019 Target Resolution Date: 03/01/2019 Goal Status: Active Interventions: Assess patient/caregiver ability to obtain necessary supplies Assess patient/caregiver ability to perform ulcer/skin care regimen upon  admission and as needed Assess ulceration(s) every visit Provide education on ulcer and skin care Notes: Electronic Signature(s) Signed: 02/18/2019 6:39:44 PM By: Zandra Abts RN, BSN Entered By: Zandra Abts on 02/18/2019 18:35:07 -------------------------------------------------------------------------------- Pain Assessment Details Patient Name: Date of Service: Carloyn [name] 02/18/2019 3:15 PM Medical Record [id number] Patient Account Number: [id number] Date of Birth/Sex: Treating RN: 09/04/1955 (64 y.o. [name] Primary Care Blanca Thornton: Chevis Pretty Other Clinician: Referring Thomasenia Dowse: Treating Lumina Gitto/Extender:Robson, [name], [facility] Weeks in Treatment: 3 Active Problems Location of Pain Severity and Description of Pain Patient Has Paino No Site Locations Pain Management and Medication Current Pain Management: Electronic Signature(s) Signed: 02/19/2019 5:23:18 PM By: Yevonne Pax RN Entered By: Yevonne Pax on 02/18/2019 16:33:31 -------------------------------------------------------------------------------- Patient/Caregiver Education Details Patient Name: Carloyn [name] 2/1/2021andnbsp3:15 Date of Service: PM Medical Record [id number] Number: Patient Account Number: [id number] Date of Birth/Gender: 1955/04/05 (63 y.o. F) Treating RN: Zandra Abts Other Clinician: Primary Care Physician: Chevis Pretty Treating Baltazar Najjar Physician/ExtenderMaryla Morrow, Referring Physician: Glorianne [place] in Treatment: 3 Education Assessment Education Provided To: Patient Education Topics Provided Wound/Skin Impairment: Methods: Explain/Verbal Responses: State content correctly Electronic Signature(s) Signed: 02/18/2019 6:39:44 PM By: Zandra Abts RN, BSN Entered By: Zandra Abts on 02/18/2019 18:35:17 -------------------------------------------------------------------------------- Wound Assessment  Details Patient Name: Date of Service: Carloyn [name] 02/18/2019 3:15 PM Medical Record [id number] Patient Account Number: [id number] Date of Birth/Sex: Treating RN: 1955/05/05 (63 y.o. [name] Primary Care Gwendalynn Eckstrom: Chevis Pretty Other Clinician: Referring Arush Gatliff: Treating Abbeygail Igoe/Extender:Robson, [name], [facility] Weeks in Treatment: 3 Wound Status Wound Number: 10 Primary Diabetic Wound/Ulcer of the Lower Extremity Etiology: Wound Location: Left Foot - Plantar, Lateral Wound Open Wounding Event: Blister Wounding Event: Blister Status: Date Acquired: 11/18/2018 Comorbid Glaucoma, Chronic sinus Weeks Of Treatment: 3 History: problems/congestion, Anemia, Sleep Apnea, Clustered Wound: No Hypertension, Type II Diabetes, Rheumatoid Arthritis, Neuropathy Photos Wound Measurements Length: (cm) 1.5 Width: (cm) 1.9 Depth: (cm) 1.7 Area: (cm) 2.238 Volume: (cm) 3.805 Wound Description Classification: Grade 2 Wound Margin: Thickened Exudate Amount: Small Exudate Type: Serosanguineous Exudate Color: red, brown Wound Bed Granulation Amount: Large (67-100%) Granulation Quality: Red Necrotic Amount: Small (1-33%) Necrotic Quality: Adherent Slough After Cleansing: No brino Yes Exposed Structure osed: No (Subcutaneous Tissue) Exposed: Yes osed: No osed: Yes sis of Muscle: No sed: No ed: Yes % Reduction in Area: -27.2% % Reduction in Volume: -44.2% Epithelialization: None Tunneling: No Undermining: No Foul Odor Slough/Fi Fascia Exp Fat Layer Tendon Exp Muscle Exp Necro Joint Expo Bone Expos Electronic Signature(s) Signed: 02/19/2019 5:19:10 PM By: Benjaman Kindler EMT/HBOT Signed: 03/14/2019 2:25:48 PM By: Zandra Abts RN, BSN Previous Signature: 02/18/2019 6:39:44 PM Version By: Zandra Abts RN, BSN Entered By: Benjaman Kindler on 02/19/2019  14:25:13 -------------------------------------------------------------------------------- Wound Assessment Details Patient Name: Date of Service: Carloyn [name]. 02/18/2019 3:15 PM Medical Record [id number] Patient Account Number: [id number] Date of Birth/Sex: Treating RN: Nov 23, 1955 (64 y.o. [name] Primary Care Laurali Goddard: Chevis Pretty Other Clinician: Referring Akeel Reffner: Treating Zoii Florer/Extender:Robson, [name], [facility] Weeks in Treatment: 3 Wound Status Wound Number: 11 Primary Diabetic Wound/Ulcer of the Lower Extremity Etiology: Wound Location: Right Toe Great - Medial Wound Healed - Epithelialized Wounding Event: Blister Status: Date  Acquired: 01/18/2019 Comorbid Glaucoma, Chronic sinus Weeks Of Treatment: 3 History: problems/congestion, Anemia, Sleep Apnea, Clustered Wound: No Hypertension, Type II Diabetes, Rheumatoid Arthritis, Neuropathy Photos Wound Measurements Length: (cm) 0 % Reductio Width: (cm) 0 % Reductio Depth: (cm) 0 Epithelial Area: (cm) 0 Tunneling Volume: (cm) 0 Undermini Wound Description Classification: Grade 2 Foul Odor Wound Margin: Flat and Intact Slough/Fib Exudate Amount: None Present Wound Bed Granulation Amount: None Present (0%) Necrotic Amount: None Present (0%) Fascia Exp Fat Layer Tendon Exp Muscle Exp Joint Expo Bone Expos After Cleansing: No rino No Exposed Structure osed: No (Subcutaneous Tissue) Exposed: No osed: No osed: No sed: No ed: No n in Area: 100% n in Volume: 100% ization: Large (67-100%) : No ng: No Electronic Signature(s) Signed: 02/19/2019 5:19:10 PM By: Benjaman Kindler EMT/HBOT Signed: 03/14/2019 2:25:48 PM By: Zandra Abts RN, BSN Previous Signature: 02/18/2019 6:39:44 PM Version By: Zandra Abts RN, BSN Entered By: Benjaman Kindler on 02/19/2019 14:24:54 -------------------------------------------------------------------------------- Wound Assessment  Details Patient Name: Date of Service: [facility] 02/18/2019 3:15 PM Medical Record [id number] Patient Account Number: [id number] Date of Birth/Sex: Treating RN: 1955/02/12 (63 y.o. [name] Primary Care Labrenda Lasky: Chevis Pretty Other Clinician: Referring Shannyn Jankowiak: Treating Aarush Stukey/Extender:Robson, [name], [facility] Weeks in Treatment: 3 Wound Status Wound Number: 9 Primary Diabetic Wound/Ulcer of the Lower Extremity Etiology: Wound Location: Right Foot - Medial Wound Open Wounding Event: Blister Status: Date Acquired: 06/18/2018 Comorbid Glaucoma, Chronic sinus Weeks Of Treatment: 3 History: problems/congestion, Anemia, Sleep Apnea, Clustered Wound: No Hypertension, Type II Diabetes, Rheumatoid Arthritis, Neuropathy Photos Wound Measurements Length: (cm) 3.9 Width: (cm) 3 Depth: (cm) 1 Area: (cm) 9.189 Volume: (cm) 9.189 Wound Description Classification: Grade 2 Wound Margin: Well defined, not attached Exudate Amount: Medium Exudate Type: Serosanguineous Exudate Color: red, brown Wound Bed Granulation Amount: Large (67-100%) Granulation Quality: Red, Pink Necrotic Amount: None Present (0%) After Cleansing: No brino Yes Exposed Structure osed: No (Subcutaneous Tissue) Exposed: Yes osed: No osed: No sed: No ed: No % Reduction in Area: 16.4% % Reduction in Volume: 7.1% Epithelialization: Small (1-33%) Tunneling: No Undermining: No Foul Odor Slough/Fi Fascia Exp Fat Layer Tendon Exp Muscle Exp Joint Expo Bone Expos Electronic Signature(s) Signed: 02/19/2019 5:19:10 PM By: Benjaman Kindler EMT/HBOT Signed: 03/14/2019 2:25:48 PM By: Zandra Abts RN, BSN Previous Signature: 02/18/2019 6:39:44 PM Version By: Zandra Abts RN, BSN Entered By: Benjaman Kindler on 02/19/2019 14:24:29 -------------------------------------------------------------------------------- Vitals Details Patient Name: Date of Service: Carloyn [name]. 02/18/2019 3:15 PM Medical Record [id number] Patient Account Number: [id number] Date of Birth/Sex: Treating RN: 09-10-1955 (64 y.o. [name] Primary Care Jaidyn Usery: Chevis Pretty Other Clinician: Referring Moni Rothrock: Treating Elijah Michaelis/Extender:Robson, [name], [facility] Weeks in Treatment: 3 Vital Signs Time Taken: 16:31 Temperature (F): 98.1 Height (in): 68 Pulse (bpm): 71 Weight (lbs): 265 Respiratory Rate (breaths/min): 20 Body Mass Index (BMI): 40.3 Blood Pressure (mmHg): 131/54 Reference Range: 80 - 120 mg / dl Electronic Signature(s) Signed: 02/19/2019 5:23:18 PM By: Yevonne Pax RN Entered By: Yevonne Pax on 02/18/2019 16:33:23

## 2019-03-14 NOTE — Progress Notes (Signed)
Sarah, [name] ([id number]) Visit Report for 03/13/2019 Arrival Information Details Patient Name: Date of Service: [name], [name] 03/13/2019 3:45 PM Medical Record [id number] Patient Account Number: [id number] Date of Birth/Sex: Treating RN: 09/22/1955 (64 y.o. [name] Primary Care Philo Kurtz: Chevis Pretty Other Clinician: Referring Dannae Kato: Treating Arrayah Connors/Extender:Stone III, Donnald Garre, ELHAMALSADAT Weeks in Treatment: 6 Visit Information History Since Last Visit Added or deleted any medications: No Patient Arrived: Crutches Any new allergies or adverse reactions: No Arrival Time: 16:32 Had a fall or experienced change in No Accompanied By: alone activities of daily living that may affect Transfer Assistance: None risk of falls: Patient Identification Verified: Yes Signs or symptoms of abuse/neglect No Secondary Verification Process Completed: Yes since last visito Patient Requires Transmission-Based No Hospitalized since last visit: No Precautions: Implantable device outside of the clinic No Patient Has Alerts: No excluding cellular tissue based products placed in the center since last visit: Has Dressing in Place as Prescribed: Yes Has Footwear/Offloading in Place as Yes Prescribed: Right: Total Contact Cast Pain Present Now: No Electronic Signature(s) Signed: 03/14/2019 8:56:01 AM By: Zandra Abts RN, BSN Entered By: Zandra Abts on 03/13/2019 16:33:23 -------------------------------------------------------------------------------- Encounter Discharge Information Details Patient Name: Date of Service: [name]. 03/13/2019 3:45 PM Medical Record [id number] Patient Account Number: [id number] Date of Birth/Sex: Treating RN: 01-24-1955 (64 y.o. [name] Primary Care Tamaria Dunleavy: Chevis Pretty Other Clinician: Referring Jazzalynn Rhudy: Treating Janan Bogie/Extender:Stone III, Donnald Garre,  ELHAMALSADAT Weeks in Treatment: 6 Encounter Discharge Information Items Discharge Condition: Stable Ambulatory Status: Crutches Discharge Destination: Home Transportation: Private Auto Accompanied By: alone Schedule Follow-up Appointment: Yes Clinical Summary of Care: Patient Declined Electronic Signature(s) Signed: 03/14/2019 8:56:01 AM By: Zandra Abts RN, BSN Entered By: Zandra Abts on 03/13/2019 18:07:32 -------------------------------------------------------------------------------- Lower Extremity Assessment Details Patient Name: Date of Service: [facility] 03/13/2019 3:45 PM Medical Record [id number] Patient Account Number: [id number] Date of Birth/Sex: Treating RN: 1955/10/17 (63 y.o. [name] Primary Care Monque Haggar: Chevis Pretty Other Clinician: Referring Champion Corales: Treating Tyteanna Ost/Extender:Stone III, Donnald Garre, ELHAMALSADAT Weeks in Treatment: 6 Edema Assessment Assessed: [Left: No] [Right: No] Edema: [Left: Yes] [Right: Yes] Calf Left: Right: Point of Measurement: 43 cm From Medial Instep 36 cm 39 cm Ankle Left: Right: Point of Measurement: 12 cm From Medial Instep 25.5 cm 29.5 cm Electronic Signature(s) Signed: 03/14/2019 8:56:01 AM By: Zandra Abts RN, BSN Entered By: Zandra Abts on 03/13/2019 17:01:31 -------------------------------------------------------------------------------- Multi-Disciplinary Care Plan Details Patient Name: Date of Service: [name]. 03/13/2019 3:45 PM Medical Record [id number] Patient Account Number: [id number] Date of Birth/Sex: Treating RN: 1955/11/25 (63 y.o. [name] Primary Care Letonya Mangels: Chevis Pretty Other Clinician: Referring Sadiq Mccauley: Chevis Pretty Treating Jameison Haji/Extender:Stone III, Jake Samples in Treatment: 6 Active Inactive Wound/Skin Impairment Nursing Diagnoses: Impaired tissue integrity Knowledge deficit related to  ulceration/compromised skin integrity Goals: Patient/caregiver will verbalize understanding of skin care regimen Date Initiated: 01/28/2019 Target Resolution Date: 03/29/2019 Goal Status: Active Interventions: Assess patient/caregiver ability to obtain necessary supplies Assess patient/caregiver ability to perform ulcer/skin care regimen upon admission and as needed Assess ulceration(s) every visit Provide education on ulcer and skin care Notes: Electronic Signature(s) Signed: 03/13/2019 6:08:21 PM By: Zenaida Deed RN, BSN Entered By: Zenaida Deed on 03/13/2019 17:15:11 -------------------------------------------------------------------------------- Pain Assessment Details Patient Name: Date of Service: [name] 03/13/2019 3:45 PM Medical Record [id number] Patient Account Number: [id number] Date of Birth/Sex: Treating RN: 12/28/1955 (63 y.o. [name] Primary Care Shelena Castelluccio: Chevis Pretty Other Clinician: Referring Jacob Chamblee: Treating Dhaval Woo/Extender:Stone III, Donnald Garre,  ELHAMALSADAT Weeks in Treatment: 6 Active Problems Location of Pain Severity and Description of Pain Patient Has Paino No Site Locations Pain Management and Medication Current Pain Management: Electronic Signature(s) Signed: 03/14/2019 8:56:01 AM By: Levan Hurst RN, BSN Entered By: Levan Hurst on 03/13/2019 17:01:28 -------------------------------------------------------------------------------- Patient/Caregiver Education Details Patient Name: [name] 2/24/2021andnbsp3:45 Date of Service: PM Medical Record [id number] Number: Patient Account Number: [id number] Date of Birth/Gender: [birth year] (63 y.o. F) Treating RN: Baruch Gouty Primary Care Other Clinician: Myrtie Hawk, Physician: Dorthula Rue Treating Worthy Keeler Physician/Extender: Referring Physician: Avon Gully in Treatment: 6 Education Assessment Education Provided  To: Patient Education Topics Provided Offloading: Methods: Explain/Verbal Responses: Reinforcements needed, State content correctly Wound/Skin Impairment: Methods: Explain/Verbal Responses: Reinforcements needed, State content correctly Electronic Signature(s) Signed: 03/13/2019 6:08:21 PM By: Baruch Gouty RN, BSN Entered By: Baruch Gouty on 03/13/2019 17:15:51 -------------------------------------------------------------------------------- Wound Assessment Details Patient Name: Date of Service: [facility] 03/13/2019 3:45 PM Medical Record [id number] Patient Account Number: [id number] Date of Birth/Sex: Treating RN: January 09, 1956 (63 y.o. [name] Primary Care Xochilt Conant: Dewayne Hatch Other Clinician: Referring Christon Gallaway: Treating Shyana Kulakowski/Extender:Stone III, Darrick Penna, ELHAMALSADAT Weeks in Treatment: 6 Wound Status Wound Number: 9 Primary Diabetic Wound/Ulcer of the Lower Extremity Etiology: Wound Location: Right Foot - Medial Wound Open Wounding Event: Blister Status: Date Acquired: 06/18/2018 Comorbid Glaucoma, Chronic sinus Weeks Of Treatment: 6 History: problems/congestion, Anemia, Sleep Apnea, Clustered Wound: No Hypertension, Type II Diabetes, Rheumatoid Arthritis, Neuropathy Wound Measurements Length: (cm) 3.8 % Reduction in Ar Width: (cm) 3.1 % Reduction in Vo Depth: (cm) 0.4 Epithelialization Area: (cm) 9.252 Tunneling: Volume: (cm) 3.701 Undermining: Wound Description Classification: Grade 2 Foul Odor After Wound Margin: Thickened Slough/Fibrino Exudate Amount: Medium Exudate Type: Serosanguineous Exudate Color: red, brown Wound Bed Granulation Amount: Large (67-100%) Granulation Quality: Red, Pink Fascia Exposed: Necrotic Amount: None Present (0%) Fat Layer (Subcut Tendon Exposed: Muscle Exposed: Joint Exposed: Bone Exposed: Cleansing: No Yes Exposed Structure No aneous Tissue) Exposed:  Yes No No No No ea: 15.9% lume: 62.6% : Small (1-33%) No No Treatment Notes Wound #9 (Right, Medial Foot) 1. Cleanse With Wound Cleanser 3. Primary Dressing Applied Calcium Alginate Ag 4. Secondary Dressing Dry Gauze Roll Gauze Drawtex 5. Secured With Tape 7. Footwear/Offloading device applied Total Contact Cast Electronic Signature(s) Signed: 03/14/2019 8:56:01 AM By: Levan Hurst RN, BSN Entered By: Levan Hurst on 03/13/2019 17:01:57 -------------------------------------------------------------------------------- [place] Details Patient Name: Date of Service: [name]. 03/13/2019 3:45 PM Medical Record [id number] Patient Account Number: [id number] Date of Birth/Sex: Treating RN: [birth year] (63 y.o. [name] Primary Care Shareese Macha: Dewayne Hatch Other Clinician: Referring Luke Rigsbee: Treating Francesco Provencal/Extender:Stone III, Darrick Penna, ELHAMALSADAT Weeks in Treatment: 6 Vital Signs Time Taken: 16:33 Temperature (F): 98.4 Height (in): 68 Pulse (bpm): 75 Weight (lbs): 265 Respiratory Rate (breaths/min): 18 Body Mass Index (BMI): 40.3 Blood Pressure (mmHg): 140/57 Reference Range: 80 - 120 mg / dl Electronic Signature(s) Signed: 03/14/2019 8:56:01 AM By: Levan Hurst RN, BSN Entered By: Levan Hurst on 03/13/2019 17:00:57

## 2019-03-14 NOTE — Progress Notes (Addendum)
[facility]  Reason for Consult: Left foot osteomyelitis Referring Provider: Dr. 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  I discussed management options with her.  I recommend PICC placement followed by 6 weeks of IV ceftriaxone and oral metronidazole.  She is in agreement with that plan.  Plan: 1. PICC placement 2. Start IV ceftriaxone and oral metronidazole 3. Follow-up in 4 weeks  Patient Active Problem List   Diagnosis Date Noted  . Osteomyelitis of left foot ([place]) 03/02/2019    Priority: High  . Puncture wound of foot 08/08/2018  . Right ankle swelling 02/08/2018  . Obesity 05/18/2017  . Hyperlipidemia 11/12/2014  . Charcot foot due to diabetes mellitus ([place]) 11/06/2014  . CKD (chronic kidney disease) stage 3, GFR 30-59 ml/min 02/02/2013  . GERD (gastroesophageal reflux disease) 03/09/2012  . Routine adult health maintenance 05/26/2010  . Obstructive sleep apnea 05/26/2010  . Normocytic anemia 01/12/2010  . Diabetes mellitus type II, controlled ([place]) 06/17/2009  . Essential hypertension 06/17/2009    Patient's Medications  New Prescriptions   METRONIDAZOLE (FLAGYL) 500 MG TABLET    Take 1 tablet (500 mg total) by mouth 3 (three) times daily.  Previous Medications   ACCU-CHEK SOFTCLIX LANCETS LANCETS    Use to check blood glucose up to three times a day. Dx code E 11.9   ACETAMINOPHEN (TYLENOL PO)    Take 1,000 mg by mouth every 6 (six) hours as needed.   ASPIRIN EC 81 MG TABLET    Take 1 tablet (81 mg total) by mouth daily.   ATORVASTATIN (LIPITOR) 40 MG TABLET    TAKE ONE TABLET BY MOUTH DAILY   BIMATOPROST (LUMIGAN) 0.03 % OPHTHALMIC SOLUTION    Place 1 drop into both eyes at bedtime.   BIOTIN 1000 MCG TABLET    Take 1,000 mcg by mouth daily.   BRIMONIDINE-TIMOLOL (COMBIGAN) 0.2-0.5 %  OPHTHALMIC SOLUTION    Place 1 drop into both eyes every 12 (twelve) hours. Place one drop to both eyes twice daily for glaucoma   CALCIUM-VITAMIN D (OSCAL-500) 500-400 MG-UNIT TABLET    Take 1 tablet by mouth 2 (two) times daily.    CHOLECALCIFEROL (VITAMIN D3) 25 MCG (1000 UT) CAPS    Take 1 capsule by mouth daily.   CYCLOSPORINE (RESTASIS) 0.05 % OPHTHALMIC EMULSION    Place 1 drop into both eyes 2 (two) times daily.   FERROUS SULFATE 325 (65 FE) MG TABLET    Take 1 tablet (325 mg total) by mouth daily with breakfast.   GLUCOSE BLOOD (ACCU-CHEK AVIVA) TEST STRIP    Use to check blood glucose up to three times a day. Dx code E 11.9   [name]    TAKE ONE TABLET BY MOUTH TWICE A DAY   LISINOPRIL (ZESTRIL) 10 MG TABLET    Take 1 tablet (10 mg total) by mouth daily.   MULTIPLE VITAMIN (MULTIVITAMIN) TABLET    Take 1 tablet by mouth daily.    NETARSUDIL-LATANOPROST (ROCKLATAN) 0.02-0.005 % SOLN    Apply 1 drop to eye daily at 10 pm.   PANTOPRAZOLE (PROTONIX) 20 MG TABLET    1 tablet twice a day   VITAMIN B-12 (CYANOCOBALAMIN) 1000 MCG TABLET    Take 1,000 mcg by mouth daily.   VITAMIN C (ASCORBIC ACID) 500 MG  TABLET    Take 500 mg by mouth daily.   ZINC 50 MG CAPS    Take 1 capsule by mouth daily.  Modified Medications   No medications on file  Discontinued Medications   No medications on file    HPI: [name] is a 64 y.o. female with diabetes and neuropathy who has had ulcers on both feet for several months.  She suffers with Charcot arthropathy.  Notes indicate that there has been exposed bone on both feet recently.  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  The MRI of her left foot showed cortical irregularity and edema of the calcaneus concerning for osteomyelitis.  She underwent bone biopsy on 03/04/2019.  No organisms were seen on stain but cultures grew Citrobacter koseri, strep mitis/oralis and strep anginosis.  Pathology  review showed only benign reactive changes.  Her C-reactive protein is elevated at 21.  Review of Systems: Review of Systems  Constitutional: Negative for chills, diaphoresis and fever.  Musculoskeletal: Negative for joint pain.  Neurological: Positive for sensory change.      Past Medical History:  Diagnosis Date  . Chronic normocytic anemia   . CKD (chronic kidney disease) stage 3, GFR 30-59 ml/min 02/02/2013  . Diabetes mellitus type II, controlled ([facility]) 06/17/2009  . Essential hypertension 06/17/2009  . GERD (gastroesophageal reflux disease)   . Glaucoma   . H/O hiatal hernia   . Hyperlipidemia   . MRSA (methicillin resistant Staphylococcus aureus)   . Obstructive sleep apnea 05/26/2010    Social History   Tobacco Use  . Smoking status: Former Smoker    Packs/day: 0.50    Years: 0.00    Pack years: 0.00    Types: Cigarettes    Quit date: 12/22/1972    Years since quitting: 46.2  . Smokeless tobacco: Never Used  Substance Use Topics  . Alcohol use: No  . Drug use: No    Family History  Problem Relation Age of Onset  . Diabetes Mother   . Hypertension Mother   . Dementia Mother   . Heart disease Father   . Stroke Father   . Diabetes Father   . Hypertension Father   . Gout Father   . Deep vein thrombosis Father   . Breast cancer Cousin   . Breast cancer Cousin    Allergies  Allergen Reactions  . Codeine     REACTION: swelling in throat and itching  . Pravastatin Other (See Comments)    cramps  . Latex Rash    OBJECTIVE: Vitals:   03/14/19 0905  BP: (!) 144/76  Pulse: 77  Temp: 98.2 F (36.8 C)  TempSrc: Oral  SpO2: 100%  Weight: 244 lb (110.7 kg)  Height: 5\' 9"  (1.753 m)   Body mass index is 36.03 kg/m.   Physical Exam Constitutional:      Comments: She is very pleasant and in no distress.  She is accompanied by her cousin.  Musculoskeletal:     Comments: She has an ulcer on the proximal lateral side of her left foot.  She has some thin  blood-tinged drainage.  There is no odor.       Microbiology: Recent Results (from the past 240 hour(s))  Aerobic/Anaerobic Culture (surgical/deep wound)     Status: None   Collection Time: 03/04/19  6:51 PM   Specimen: Bone  Result Value Ref Range Status   Specimen Description BONE LEFT FOOT  Final  Special Requests PLANTAR  Final   Gram Stain   Final    NO WBC SEEN NO ORGANISMS SEEN Performed at [facility], [address], [place], [place] [address]    Culture   Final    FEW CITROBACTER KOSERI FEW STREPTOCOCCUS MITIS/ORALIS FEW STREPTOCOCCUS ANGINOSIS NO ANAEROBES ISOLATED; CULTURE IN PROGRESS FOR 5 DAYS CRITICAL RESULT CALLED TO, READ BACK BY AND VERIFIED WITH: RN Coralie Carpen [phone] AT 1058 BY CM NO ANAEROBES ISOLATED    Report Status 03/09/2019 FINAL  Final   Organism ID, Bacteria CITROBACTER KOSERI  Final   Organism ID, Bacteria STREPTOCOCCUS MITIS/ORALIS  Final   Organism ID, Bacteria STREPTOCOCCUS ANGINOSIS  Final      Susceptibility   Citrobacter koseri - MIC*    CEFAZOLIN <=4 SENSITIVE Sensitive     CEFEPIME <=0.12 SENSITIVE Sensitive     CEFTAZIDIME <=1 SENSITIVE Sensitive     CEFTRIAXONE <=0.25 SENSITIVE Sensitive     CIPROFLOXACIN <=0.25 SENSITIVE Sensitive     GENTAMICIN <=1 SENSITIVE Sensitive     IMIPENEM <=0.25 SENSITIVE Sensitive     TRIMETH/SULFA <=20 SENSITIVE Sensitive     PIP/TAZO <=4 SENSITIVE Sensitive     * FEW CITROBACTER KOSERI   Streptococcus mitis/oralis - MIC*    TETRACYCLINE <=0.25 SENSITIVE Sensitive     VANCOMYCIN 0.5 SENSITIVE Sensitive     CLINDAMYCIN <=0.25 SENSITIVE Sensitive     PENICILLIN Value in next row Sensitive      SENSITIVE0.12    CEFTRIAXONE Value in next row Sensitive      SENSITIVE0.5    * FEW STREPTOCOCCUS MITIS/ORALIS   Streptococcus anginosis - MIC*    PENICILLIN Value in next row Sensitive      SENSITIVE0.5    CEFTRIAXONE Value in next row Sensitive      SENSITIVE0.5    ERYTHROMYCIN Value in next  row Sensitive      SENSITIVE0.5    LEVOFLOXACIN Value in next row Sensitive      SENSITIVE0.5    VANCOMYCIN Value in next row Sensitive      SENSITIVE0.5    * FEW STREPTOCOCCUS ANGINOSIS    Cliffton Asters, MD [facility] [phone] pager   [phone] cell 03/14/2019, 9:28 AM

## 2019-03-14 NOTE — Telephone Encounter (Signed)
Pls contact pt [phone] 

## 2019-03-14 NOTE — Progress Notes (Signed)
Sarah, [name] ([id number]) Visit Report for 03/11/2019 Debridement Details Patient Name: Date of Service: [name], [name] 03/11/2019 3:15 PM Medical Record [id number] Patient Account Number: [id number] Date of Birth/Sex: Treating RN: 1955/04/04 (64 y.o. [name] Primary Care Provider: Dewayne Hatch Other Clinician: Referring Provider: Dewayne Hatch Treating Provider/Extender:Analissa Bayless, Esperanza Richters in Treatment: 6 Debridement Performed for Wound #10 Left,Lateral,Plantar Foot Assessment: Performed By: Physician Ricard Dillon., MD Debridement Type: Debridement Severity of Tissue Pre Bone involvement without necrosis Debridement: Level of Consciousness (Pre- Awake and Alert procedure): Pre-procedure Verification/Time Out Taken: Yes - 15:58 Start Time: 16:02 Total Area Debrided (L x W): 1.5 (cm) x 1.7 (cm) = 2.55 (cm) Tissue and other material Viable, Non-Viable, Callus, Subcutaneous debrided: Level: Skin/Subcutaneous Tissue Debridement Description: Excisional Instrument: Curette Bleeding: Moderate Hemostasis Achieved: Silver Nitrate End Time: 16:03 Procedural Pain: 0 Post Procedural Pain: 0 Response to Treatment: Procedure was tolerated well Level of Consciousness Awake and Alert (Post-procedure): Post Debridement Measurements of Total Wound Length: (cm) 1.5 Width: (cm) 1.7 Depth: (cm) 1.7 Volume: (cm) 3.405 Character of Wound/Ulcer Post Improved Debridement: Bone involvement without Severity of Tissue Post Debridement: necrosis Post Procedure Diagnosis Same as Pre-procedure Electronic Signature(s) Signed: 03/11/2019 5:55:43 PM By: Linton Ham MD Signed: 03/14/2019 8:56:01 AM By: Levan Hurst RN, BSN Entered By: Linton Ham on 03/11/2019 17:29:09 -------------------------------------------------------------------------------- Debridement Details Patient Name: Date of Service: [name]. 03/11/2019 3:15  PM Medical Record [id number] Patient Account Number: [id number] Date of Birth/Sex: Treating RN: 1955-04-18 (64 y.o. [name] Primary Care Provider: Dewayne Hatch Other Clinician: Referring Provider: Dewayne Hatch Treating Provider/Extender:Exa Bomba, Esperanza Richters in Treatment: 6 Debridement Performed for Wound #9 Right,Medial Foot Assessment: Performed By: Physician Ricard Dillon., MD Debridement Type: Debridement Severity of Tissue Pre Fat layer exposed Debridement: Level of Consciousness (Pre- Awake and Alert procedure): Pre-procedure Yes - 15:58 Verification/Time Out Taken: Start Time: 15:58 Total Area Debrided (L x W): 3.8 (cm) x 3.1 (cm) = 11.78 (cm) Tissue and other material Viable, Non-Viable, Callus, Subcutaneous debrided: Level: Skin/Subcutaneous Tissue Debridement Description: Excisional Instrument: Curette Bleeding: Moderate Hemostasis Achieved: Silver Nitrate End Time: 16:00 Procedural Pain: 0 Post Procedural Pain: 0 Response to Treatment: Procedure was tolerated well Level of Consciousness Awake and Alert (Post-procedure): Post Debridement Measurements of Total Wound Length: (cm) 3.8 Width: (cm) 3.1 Depth: (cm) 0.4 Volume: (cm) 3.701 Character of Wound/Ulcer Post Improved Debridement: Severity of Tissue Post Debridement: Fat layer exposed Post Procedure Diagnosis Same as Pre-procedure Electronic Signature(s) Signed: 03/11/2019 5:55:43 PM By: Linton Ham MD Signed: 03/14/2019 8:56:01 AM By: Levan Hurst RN, BSN Entered By: Linton Ham on 03/11/2019 17:29:18 -------------------------------------------------------------------------------- HPI Details Patient Name: Date of Service: [name]. 03/11/2019 3:15 PM Medical Record [id number] Patient Account Number: [id number] Date of Birth/Sex: Treating RN: October 15, 1955 (64 y.o. [name] Primary Care Provider: Dewayne Hatch Other  Clinician: Referring Provider: Treating Provider/Extender:Prentice Sackrider, [name], [facility] Weeks in Treatment: 6 History of Present Illness Location: left plantar foot Quality: Patient reports No Pain. Duration: 2 months Modifying Factors: d.m. HPI Description: 64 yrs old bf here for follow up recurrent left charcot foot ulcer. She has DM. She is not a smoker. She states a blister developed 3 months ago at site of previous breakdown from 1 year ago. It was debrided at the podiatrist's office. She went to the ER and then sent here. She denies pain. Xray was negative for osteo. Culture from this clinic negative. 09/08/14 Referred by [name] to [name] for Charcot foot. Seen by  him and MRI scheduled 09/19/14. Prior silver collagen but this was not ordered last visit, patient has been rinsing with saline alone. Re institute silver collagen every other day. F/u 2 weeks with [name] as Labor Day holiday. Supplies ordered. 09/25/14; this is a patient with a Charcot foot on the left. she has a wound over the plantar aspect of her left calcaneus. She has been seen by [name] of orthopedic surgery and has had an MRI. She is apparently going within the next week or 2 for corrective surgery which will involve an external fixator. I don't have any of the details of this. 10/06/14; The patient is a 64 yrs old bf with a left Charcot foot and ulcer on the plantar. 12/01/14 Returns post surgery with [name]. Has follow up visit later this week with him, currently in facility and NWB over this extremity. States no drainage from foot and no current wound care. On exam callus and scab in place, suspect healed. Has external fixator in place. Will defer to [name] for management, ok to place moisturizing cream over scabs and callus and she will call if she requires follow up here. READMISSION 01/28/2019 Patient was in the clinic in 2016 for a wound on the plantar aspect of her left calcaneus.  Predominantly cared for by [name] she was referred to [name] and had corrective surgery for the position of her left ankle I believe. She had previously been here in 2015 and then prior to that in 2011 2012 that I do not have these records. More recently she has developed fairly extensive wounds on the right medial foot, left lateral foot and a small area on the right medial great toe. Right medial foot wound has been there for 6 to 7 months, left foot wound for 2 to 3 months and the right great toe only over the last few weeks. She has been followed by [name] at Dr. Laverta [place] office it sounds as though she has been undergoing callus paring and silver nitrate. The patient has been washing these off with soap and water and plan applying dry dressing Past medical history, type 2 diabetes well controlled with a recent hemoglobin A1c of 7 on 11/16, type 2 diabetes with peripheral neuropathy, previous left Charcot joint surgery bilateral Charcot joints currently, stage III chronic renal failure, hypertension, obstructive sleep apnea, history of MRSA and gastroesophageal reflux. 1/18; this is a patient with bilateral Charcot feet. Plain x-rays that I did of both of these wounds that are either at bone or precariously close to bone were done. On the right foot this showed possible lytic destruction involving the anterior aspect of the talus which may represent a wound osteomyelitis. An MRI was recommended. On the left there was no definite lytic destruction although the wound is deep undermining's and I think probably requires an MRI on this basis in any case 1/25; patient was supposed to have her MRI last Friday of her bilateral feet however they would not do it because there was silver alginate in her dressings, will replace with calcium alginate today and will see if we can get her done today 2/1; patient did not get her MRIs done last week because of issues with the MRI department  receiving orders to do bilateral feet. She has 2 deep wounds in the setting of Charcot feet deformities bilaterally. Both of them at one point had exposed bone although I had trouble demonstrating that today. I am not sure that  we can offload these very aggressively as I watched her leave the clinic last week her gait is very narrow and unsteady. 2/15; the MRI of her right foot did not show osteomyelitis potentially osteonecrosis of the talus. 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 The wounds are a large wound on the right medial foot and on the left a substantial wound on the left lateral plantar calcaneus. We have been using silver alginate. Ultimately I think we need to consider a total contact cast on the right while we work through the possibility of osteomyelitis in the left. I watched her walk out with her crutches I have some trepidation about the ability to walk with the cast on her right foot. Nevertheless with her Charcot deformities I do not think there is a way to heal these short of offloading them aggressively. 2/22; the culture of the bone in the left foot that I did last week showed a few Citrobacter Koseri as well as some streptococcal species. In my attempted bone for pathology did not actually yield a lot of bone the pathologist did not identify any osteomyelitis. 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 Is promised today and aggressive debridement of the right foot and a total contact cast which surprisingly she seemed to tolerate quite well Electronic Signature(s) Signed: 03/11/2019 5:55:43 PM By: Baltazar Najjar MD Entered By: Baltazar Najjar on 03/11/2019 17:33:47 -------------------------------------------------------------------------------- Physical Exam Details Patient  Name: Date of Service: [name] 03/11/2019 3:15 PM Medical Record [id number] Patient Account Number: [id number] Date of Birth/Sex: Treating RN: 1955/09/15 (64 y.o. [name] Primary Care Provider: Chevis Pretty Other Clinician: Referring Provider: Treating Provider/Extender:Andre Gallego, [name], [facility] Weeks in Treatment: 6 Constitutional Patient is hypertensive.. Pulse regular and within target range for patient.Marland Kitchen Respirations regular, non-labored and within target range.. Temperature is normal and within the target range for the patient.Marland Kitchen Appears in no distress. Notes Wound exam Left lateral midfoot at the level of her calcaneus. Deep punched out area debrided with a #5 curette. This easily probes to bone and a small area of the central part of the wound. The right foot wound on the medial foot. I removed skin and subcutaneous tissue from the wound circumference and then copious amounts of debris over the wound surface. Hemostasis with silver nitrate. There is no exposed bone in this wound Electronic Signature(s) Signed: 03/11/2019 5:55:43 PM By: Baltazar Najjar MD Entered By: Baltazar Najjar on 03/11/2019 17:36:16 -------------------------------------------------------------------------------- Physician Orders Details Patient Name: Date of Service: [name] 03/11/2019 3:15 PM Medical Record [id number] Patient Account Number: [id number] Date of Birth/Sex: Treating RN: 20-Aug-1955 (63 y.o. [name] Primary Care Provider: Chevis Pretty Other Clinician: Referring Provider: Treating Provider/Extender:Louann Hopson, [name], [facility] Weeks in Treatment: 6 Verbal / Phone Orders: No Diagnosis Coding ICD-10 Coding Code Description E11.621 Type 2 diabetes mellitus with foot ulcer L97.524 Non-pressure chronic ulcer of other part of left foot with necrosis of bone L97.511 Non-pressure chronic ulcer of other  part of right foot limited to breakdown of skin L97.514 Non-pressure chronic ulcer of other part of right foot with necrosis of bone E11.42 Type 2 diabetes mellitus with diabetic polyneuropathy M14.671 Charcot's joint, right ankle and foot M14.672 Charcot's joint, left ankle and foot Follow-up Appointments Return Appointment in: - Wed or Thurs for cast  change Dressing Change Frequency Wound #10 Left,Lateral,Plantar Foot Change dressing every day. Wound #9 Right,Medial Foot Do not change entire dressing for one week. Wound Cleansing May shower with protection. Primary Wound Dressing Wound #10 Left,Lateral,Plantar Foot Calcium Alginate with Silver - lightly pack Wound #9 Right,Medial Foot Calcium Alginate with Silver Secondary Dressing Wound #10 Left,Lateral,Plantar Foot Kerlix/Rolled Gauze Dry Gauze Other: - foam donut Wound #9 Right,Medial Foot Foam Kerlix/Rolled Gauze Drawtex Off-Loading Total Contact Cast to Right Lower Extremity Consults Infectious Disease - Osteomyelitis left plantar foot - (ICD10 E11.621 - Type 2 diabetes mellitus with foot ulcer) Electronic Signature(s) Signed: 03/11/2019 5:55:43 PM By: Baltazar Najjar MD Signed: 03/14/2019 8:56:01 AM By: Zandra Abts RN, BSN Entered By: Zandra Abts on 03/11/2019 16:06:59 -------------------------------------------------------------------------------- Prescription 03/11/2019 Patient Name: [name] Provider: Baltazar Najjar MD Date of Birth: 05-25-1955 NPI#: [id number] Sex: F DEA#: [id number] Phone #: [phone] License #: [id number] Patient Address: [facility] [address] [place], [place] [address] [address] [place], [place] [address] [phone] Allergies codeine Reaction: swelling in throat and itching Severity: Severe pravastatin Reaction: cramps, intolerence Severity: Severe Provider's Orders Infectious Disease - ICD10: E11.621 -  Osteomyelitis left plantar foot Signature(s): Date(s): Electronic Signature(s) Signed: 03/11/2019 5:55:43 PM By: Baltazar Najjar MD Signed: 03/14/2019 8:56:01 AM By: Zandra Abts RN, BSN Entered By: Zandra Abts on 03/11/2019 16:07:00 --------------------------------------------------------------------------------  Problem List Details Patient Name: Date of Service: [name] 03/11/2019 3:15 PM Medical Record [id number] Patient Account Number: [id number] Date of Birth/Sex: Treating RN: April 28, 1955 (64 y.o. [name] Primary Care Provider: Chevis Pretty Other Clinician: Referring Provider: Treating Provider/Extender:Kennie Snedden, [name], [facility] Weeks in Treatment: 6 Active Problems ICD-10 Evaluated Encounter Code Description Active Date Today Diagnosis E11.621 Type 2 diabetes mellitus with foot ulcer 01/28/2019 No Yes L97.524 Non-pressure chronic ulcer of other part of left foot 01/28/2019 No Yes with necrosis of bone L97.511 Non-pressure chronic ulcer of other part of right foot 01/28/2019 No Yes limited to breakdown of skin L97.514 Non-pressure chronic ulcer of other part of right foot 01/28/2019 No Yes with necrosis of bone E11.42 Type 2 diabetes mellitus with diabetic polyneuropathy 01/28/2019 No Yes M14.671 Charcot's joint, right ankle and foot 01/28/2019 No Yes M14.672 Charcot's joint, left ankle and foot 01/28/2019 No Yes Inactive Problems Resolved Problems Electronic Signature(s) Signed: 03/11/2019 5:55:43 PM By: Baltazar Najjar MD Entered By: Baltazar Najjar on 03/11/2019 17:28:47 -------------------------------------------------------------------------------- Progress Note Details Patient Name: Date of Service: [name] 03/11/2019 3:15 PM Medical Record [id number] Patient Account Number: [id number] Date of Birth/Sex: Treating RN: [birth year] (64 y.o. [name] Primary Care Provider: Chevis Pretty Other Clinician: Referring Provider: Treating Provider/Extender:Jemimah Cressy, [name], [facility] Weeks in Treatment: 6 Subjective History of Present Illness (HPI) The following HPI elements were documented for the patient's wound: Location: left plantar foot Quality: Patient reports No Pain. Duration: 2 months Modifying Factors: d.m. 64 yrs old bf here for follow up recurrent left charcot foot ulcer. She has DM. She is not a smoker. She states a blister developed 3 months ago at site of previous breakdown from 1 year ago. It was debrided at the podiatrist's office. She went to the ER and then sent here. She denies pain. Xray was negative for osteo. Culture from this clinic negative. 09/08/14 Referred by [name] to [name] for Charcot foot. Seen by him and MRI scheduled 09/19/14. Prior silver collagen but this was not ordered last visit, patient has been rinsing  with saline alone. Re institute silver collagen every other day. F/u 2 weeks with [name] as Labor Day holiday. Supplies ordered. 09/25/14; this is a patient with a Charcot foot on the left. she has a wound over the plantar aspect of her left calcaneus. She has been seen by [name] of orthopedic surgery and has had an MRI. She is apparently going within the next week or 2 for corrective surgery which will involve an external fixator. I don't have any of the details of this. 10/06/14; The patient is a 64 yrs old bf with a left Charcot foot and ulcer on the plantar. 12/01/14 Returns post surgery with [name]. Has follow up visit later this week with him, currently in facility and NWB over this extremity. States no drainage from foot and no current wound care. On exam callus and scab in place, suspect healed. Has external fixator in place. Will defer to [name] for management, ok to place moisturizing cream over scabs and callus and she will call if she requires follow up  here. READMISSION 01/28/2019 Patient was in the clinic in 2016 for a wound on the plantar aspect of her left calcaneus. Predominantly cared for by [name] she was referred to [name] and had corrective surgery for the position of her left ankle I believe. She had previously been here in 2015 and then prior to that in 2011 2012 that I do not have these records. More recently she has developed fairly extensive wounds on the right medial foot, left lateral foot and a small area on the right medial great toe. Right medial foot wound has been there for 6 to 7 months, left foot wound for 2 to 3 months and the right great toe only over the last few weeks. She has been followed by [name] at Dr. Laverta [place] office it sounds as though she has been undergoing callus paring and silver nitrate. The patient has been washing these off with soap and water and plan applying dry dressing Past medical history, type 2 diabetes well controlled with a recent hemoglobin A1c of 7 on 11/16, type 2 diabetes with peripheral neuropathy, previous left Charcot joint surgery bilateral Charcot joints currently, stage III chronic renal failure, hypertension, obstructive sleep apnea, history of MRSA and gastroesophageal reflux. 1/18; this is a patient with bilateral Charcot feet. Plain x-rays that I did of both of these wounds that are either at bone or precariously close to bone were done. On the right foot this showed possible lytic destruction involving the anterior aspect of the talus which may represent a wound osteomyelitis. An MRI was recommended. On the left there was no definite lytic destruction although the wound is deep undermining's and I think probably requires an MRI on this basis in any case 1/25; patient was supposed to have her MRI last Friday of her bilateral feet however they would not do it because there was silver alginate in her dressings, will replace with calcium alginate today and will see if  we can get her done today 2/1; patient did not get her MRIs done last week because of issues with the MRI department receiving orders to do bilateral feet. She has 2 deep wounds in the setting of Charcot feet deformities bilaterally. Both of them at one point had exposed bone although I had trouble demonstrating that today. I am not sure that we can offload these very aggressively as I watched her leave the clinic last week her gait is very  narrow and unsteady. 2/15; the MRI of her right foot did not show osteomyelitis potentially osteonecrosis of the talus. 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 The wounds are a large wound on the right medial foot and on the left a substantial wound on the left lateral plantar calcaneus. We have been using silver alginate. Ultimately I think we need to consider a total contact cast on the right while we work through the possibility of osteomyelitis in the left. I watched her walk out with her crutches I have some trepidation about the ability to walk with the cast on her right foot. Nevertheless with her Charcot deformities I do not think there is a way to heal these short of offloading them aggressively. 2/22; the culture of the bone in the left foot that I did last week showed a few Citrobacter Koseri as well as some streptococcal species. In my attempted bone for pathology did not actually yield a lot of bone the pathologist did not identify any osteomyelitis. 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 Is promised today and aggressive debridement of the right foot and a total contact cast which surprisingly she seemed to tolerate quite well Objective Constitutional Patient is hypertensive.. Pulse regular and within target range for patient.Marland Kitchen Respirations  regular, non-labored and within target range.. Temperature is normal and within the target range for the patient.Marland Kitchen Appears in no distress. Vitals Time Taken: 3:22 PM, Height: 68 in, Weight: 265 lbs, BMI: 40.3, Temperature: 98.6 F, Pulse: 81 bpm, Respiratory Rate: 19 breaths/min, Blood Pressure: 157/71 mmHg, Capillary Blood Glucose: 127 mg/dl. General Notes: patient stated last CBG was 127 General Notes: Wound exam ooLeft lateral midfoot at the level of her calcaneus. Deep punched out area debrided with a #5 curette. This easily probes to bone and a small area of the central part of the wound. ooThe right foot wound on the medial foot. I removed skin and subcutaneous tissue from the wound circumference and then copious amounts of debris over the wound surface. Hemostasis with silver nitrate. There is no exposed bone in this wound Integumentary (Hair, Skin) Wound #10 status is Open. Original cause of wound was Blister. The wound is located on the Left,Lateral,Plantar Foot. The wound measures 1.5cm length x 1.7cm width x 1.7cm depth; 2.003cm^2 area and 3.405cm^3 volume. There is muscle and Fat Layer (Subcutaneous Tissue) Exposed exposed. There is no tunneling noted, however, there is undermining starting at 12:00 and ending at 4:00 with a maximum distance of 1cm. There is a medium amount of serosanguineous drainage noted. The wound margin is thickened. There is large (67-100%) red granulation within the wound bed. There is a small (1-33%) amount of necrotic tissue within the wound bed including Adherent Slough. Wound #9 status is Open. Original cause of wound was Blister. The wound is located on the Right,Medial Foot. The wound measures 3.8cm length x 3.1cm width x 0.4cm depth; 9.252cm^2 area and 3.701cm^3 volume. There is Fat Layer (Subcutaneous Tissue) Exposed exposed. There is no tunneling or undermining noted. There is a medium amount of serosanguineous drainage noted. The wound margin is  thickened. There is large (67-100%) red, pink granulation within the wound bed. There is no necrotic tissue within the wound bed. Assessment Active Problems ICD-10 Type 2 diabetes mellitus with foot ulcer Non-pressure chronic ulcer  of other part of left foot with necrosis of bone Non-pressure chronic ulcer of other part of right foot limited to breakdown of skin Non-pressure chronic ulcer of other part of right foot with necrosis of bone Type 2 diabetes mellitus with diabetic polyneuropathy Charcot's joint, right ankle and foot Charcot's joint, left ankle and foot Procedures Wound #10 Pre-procedure diagnosis of Wound #10 is a Diabetic Wound/Ulcer of the Lower Extremity located on the Left,Lateral,Plantar Foot .Severity of Tissue Pre Debridement is: Bone involvement without necrosis. There was a Excisional Skin/Subcutaneous Tissue Debridement with a total area of 2.55 sq cm performed by [name], MD. With the following instrument(s): Curette to remove Viable and Non-Viable tissue/material. Material removed includes Callus and Subcutaneous Tissue and. No specimens were taken. A time out was conducted at 15:58, prior to the start of the procedure. A Moderate amount of bleeding was controlled with Silver Nitrate. The procedure was tolerated well with a pain level of 0 throughout and a pain level of 0 following the procedure. Post Debridement Measurements: 1.5cm length x 1.7cm width x 1.7cm depth; 3.405cm^3 volume. Character of Wound/Ulcer Post Debridement is improved. Severity of Tissue Post Debridement is: Bone involvement without necrosis. Post procedure Diagnosis Wound #10: Same as Pre-Procedure Wound #9 Pre-procedure diagnosis of Wound #9 is a Diabetic Wound/Ulcer of the Lower Extremity located on the Right,Medial Foot .Severity of Tissue Pre Debridement is: Fat layer exposed. There was a Excisional Skin/Subcutaneous Tissue Debridement with a total area of 11.78 sq cm performed  by [name], MD. With the following instrument(s): Curette to remove Viable and Non-Viable tissue/material. Material removed includes Callus and Subcutaneous Tissue and. No specimens were taken. A time out was conducted at 15:58, prior to the start of the procedure. A Moderate amount of bleeding was controlled with Silver Nitrate. The procedure was tolerated well with a pain level of 0 throughout and a pain level of 0 following the procedure. Post Debridement Measurements: 3.8cm length x 3.1cm width x 0.4cm depth; 3.701cm^3 volume. Character of Wound/Ulcer Post Debridement is improved. Severity of Tissue Post Debridement is: Fat layer exposed. Post procedure Diagnosis Wound #9: Same as Pre-Procedure Pre-procedure diagnosis of Wound #9 is a Diabetic Wound/Ulcer of the Lower Extremity located on the Right,Medial Foot . There was a Total Contact Cast Procedure by [name], MD. Post procedure Diagnosis Wound #9: Same as Pre-Procedure Plan Follow-up Appointments: Return Appointment in: - Wed or Thurs for cast change Dressing Change Frequency: Wound #10 Left,Lateral,Plantar Foot: Change dressing every day. Wound #9 Right,Medial Foot: Do not change entire dressing for one week. Wound Cleansing: May shower with protection. Primary Wound Dressing: Wound #10 Left,Lateral,Plantar Foot: Calcium Alginate with Silver - lightly pack Wound #9 Right,Medial Foot: Calcium Alginate with Silver Secondary Dressing: Wound #10 Left,Lateral,Plantar Foot: Kerlix/Rolled Gauze Dry Gauze Other: - foam donut Wound #9 Right,Medial Foot: Foam Kerlix/Rolled Gauze Drawtex Off-Loading: Total Contact Cast to Right Lower Extremity Consults ordered were: Infectious Disease - Osteomyelitis left plantar foot 1. Continue with silver alginate as a primary dressing to both wounds 2. Aggressive debridement on the right foot today and ultimate placement into total contact cast. 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 3. The patient has osteomyelitis in the left foot. I am going to refer her to infectious disease for antibiotic guidance. In the short-term I suppose I could treat her with Levaquin and perhaps Flagyl however we will see how long it  will take to get her into the appointment before I prescribe oral antibiotics. 4. Surprisingly she tolerated a total contact cast quite well Electronic Signature(s) Signed: 03/11/2019 5:55:43 PM By: Baltazar Najjar MD Entered By: Baltazar Najjar on 03/11/2019 17:38:09 -------------------------------------------------------------------------------- Total Contact Cast Details Patient Name: Date of Service: LASHAY, OSBORNE 03/11/2019 3:15 PM Medical Record [id number] Patient Account Number: [id number] Date of Birth/Sex: Treating RN: Aug 25, 1955 (64 y.o. [name] Primary Care Provider: Chevis Pretty Other Clinician: Referring Provider: Chevis Pretty Treating Provider/Extender:Jasiah Elsen, Lamar Sprinkles in Treatment: 6 Total Contact Cast Applied for Wound Assessment: Wound #9 Right,Medial Foot Performed By: Physician [name], MD Post Procedure Diagnosis Same as Pre-procedure Electronic Signature(s) Signed: 03/11/2019 5:55:43 PM By: Baltazar Najjar MD Entered By: Baltazar Najjar on 03/11/2019 17:29:26 -------------------------------------------------------------------------------- SuperBill Details Patient Name: Date of Service: [name] 03/11/2019 Medical Record [id number] Patient Account Number: [id number] Date of Birth/Sex: Treating RN: 25-Feb-1955 (63 y.o. 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 M14.671 Charcot's joint, right ankle and foot M14.672 Charcot's joint, left ankle and foot Facility Procedures CPT4 Code Description: 57846962 11042 - DEB SUBQ TISSUE 20 SQ CM/< ICD-10 Diagnosis Description L97.524 Non-pressure chronic ulcer of other part of left foot with L97.514 Non-pressure chronic ulcer of other part of right foot Modifier: necrosis of b with necrosis Quantity: 1 one of bone Physician Procedures CPT4 Code Description: 9528413 11042 - WC PHYS SUBQ TISS 20 SQ CM ICD-10 Diagnosis Description L97.524 Non-pressure chronic ulcer of other part of left foot with L97.514 Non-pressure chronic ulcer of other part of right foot wit Modifier: necrosis of bon h necrosis of bo Quantity: 1 e ne Electronic Signature(s) Signed: 03/11/2019 5:55:43 PM By: Baltazar Najjar MD Entered By: Baltazar Najjar on 03/11/2019 17:38:38

## 2019-03-14 NOTE — Telephone Encounter (Signed)
Tc, lady answered and stated pt was seeing eye dr at this time and would call clinic back

## 2019-03-14 NOTE — Progress Notes (Signed)
[name] ([id number]) Visit Report for 03/11/2019 Arrival Information Details Patient Name: Date of Service: [name] 03/11/2019 3:15 PM Medical Record [id number] Patient Account Number: [id number] Date of Birth/Sex: Treating RN: 1956-01-16 (64 y.o. Clearnce Sorrel Primary Care Mari Battaglia: Dewayne Hatch Other Clinician: Referring Radie Berges: Treating Breland Trouten/Extender:Robson, [name], [facility] Weeks in Treatment: 6 Visit Information History Since Last Visit Added or deleted any medications: No Patient Arrived: Crutches Any new allergies or adverse reactions: No Arrival Time: 15:21 Had a fall or experienced change in No Accompanied By: self activities of daily living that may affect Transfer Assistance: None risk of falls: Patient Identification Verified: Yes Signs or symptoms of abuse/neglect since last No Secondary Verification Process Completed: Yes visito Patient Requires Transmission-Based No Hospitalized since last visit: No Precautions: Implantable device outside of the clinic excluding No Patient Has Alerts: No cellular tissue based products placed in the center since last visit: Has Dressing in Place as Prescribed: Yes Pain Present Now: No Electronic Signature(s) Signed: 03/11/2019 5:29:42 PM By: Kela Millin Entered By: Kela Millin on 03/11/2019 15:22:13 -------------------------------------------------------------------------------- Encounter Discharge Information Details Patient Name: Date of Service: Sarah Libra. 03/11/2019 3:15 PM Medical Record [id number] Patient Account Number: [id number] Date of Birth/Sex: Treating RN: 12-03-1955 (64 y.o. [name] Primary Care Greydon Betke: Dewayne Hatch Other Clinician: Referring Damonique Brunelle: Treating Charnelle Bergeman/Extender:Robson, [name], [facility] Weeks in Treatment: 6 Encounter Discharge Information Items Post Procedure  Vitals Discharge Condition: Stable Temperature (F): 98.6 Ambulatory Status: Crutches Pulse (bpm): 81 Discharge Destination: Home Respiratory Rate (breaths/min): 19 Transportation: Private Auto Blood Pressure (mmHg): 157/71 Accompanied By: self Schedule Follow-up Appointment: Yes Clinical Summary of Care: Electronic Signature(s) Signed: 03/11/2019 5:30:41 PM By: Deon Pilling Entered By: Deon Pilling on 03/11/2019 16:52:31 -------------------------------------------------------------------------------- Lower Extremity Assessment Details Patient Name: Date of Service: [name], [name] 03/11/2019 3:15 PM Medical Record [id number] Patient Account Number: [id number] Date of Birth/Sex: Treating RN: 1955/01/21 (64 y.o. Clearnce Sorrel Primary Care Menucha Dicesare: Dewayne Hatch Other Clinician: Referring Neidra Girvan: Treating Hazely Sealey/Extender:Robson, [name], [name] Weeks in Treatment: 6 Edema Assessment Assessed: [Left: No] [Right: No] Edema: [Left: Yes] [Right: Yes] Calf Left: Right: Point of Measurement: 43 cm From Medial Instep 36 cm 39 cm Ankle Left: Right: Point of Measurement: 12 cm From Medial Instep 25.5 cm 29.5 cm Vascular Assessment Pulses: Dorsalis Pedis Palpable: [Left:Yes] [Right:Yes] Electronic Signature(s) Signed: 03/11/2019 5:29:42 PM By: Kela Millin Entered By: Kela Millin on 03/11/2019 15:25:08 -------------------------------------------------------------------------------- Multi Wound Chart Details Patient Name: Date of Service: Sarah Libra. 03/11/2019 3:15 PM Medical Record [id number] Patient Account Number: [id number] Date of Birth/Sex: Treating RN: 10/11/1955 (64 y.o. [name] Primary Care Naomie Crow: Dewayne Hatch Other Clinician: Referring Lyrica Mcclarty: Treating Eliasar Hlavaty/Extender:Robson, [name], [facility] Weeks in Treatment: 6 Vital Signs Height(in): 68 Capillary  Blood 127 Glucose(mg/dl): Weight(lbs): 265 Pulse(bpm): 18 Body Mass Index(BMI): 40 Blood Pressure(mmHg): 157/71 Temperature(F): 98.6 Respiratory 19 Rate(breaths/min): Photos: [10:No Photos] [9:No Photos] [N/A:N/A] Wound Location: [10:Left Foot - Plantar, Lateral Right Foot - Medial] [N/A:N/A] Wounding Event: [10:Blister] [9:Blister] [N/A:N/A] Primary Etiology: [10:Diabetic Wound/Ulcer of the Diabetic Wound/Ulcer of the N/A Lower Extremity] [9:Lower Extremity] Comorbid History: [10:Glaucoma, Chronic sinus Glaucoma, Chronic sinus N/A problems/congestion, Anemia, Sleep Apnea, Hypertension, Type II Diabetes, Rheumatoid Arthritis, Neuropathy] [9:problems/congestion, Anemia, Sleep Apnea, Hypertension, Type II  Diabetes, Rheumatoid Arthritis, Neuropathy] Date Acquired: [10:11/18/2018] [9:06/18/2018] [N/A:N/A] Weeks of Treatment: [10:6] [9:6] [N/A:N/A] Wound Status: [10:Open] [9:Open] [N/A:N/A] Measurements L x W x D 1.5x1.7x1.7 [9:3.8x3.1x0.4] [N/A:N/A] (cm) Area (cm) : [10:2.003] [9:9.252] [N/A:N/A] Volume (cm) : [10:3.405] [9:3.701] [  N/A:N/A] % Reduction in Area: [10:-13.90%] [9:15.90%] [N/A:N/A] % Reduction in Volume: -29.00% [9:62.60%] [N/A:N/A] Starting Position 1 12 (o'clock): Ending Position 1 [10:4] (o'clock): Maximum Distance 1 [10:1] (cm): Undermining: [10:Yes] [9:No] [N/A:N/A] Classification: [10:Grade 2] [9:Grade 2] [N/A:N/A] Exudate Amount: [10:Medium] [9:Medium] [N/A:N/A] Exudate Type: [10:Serosanguineous] [9:Serosanguineous] [N/A:N/A] Exudate Color: [10:red, brown] [9:red, brown] [N/A:N/A] Wound Margin: [10:Thickened] [9:Thickened] [N/A:N/A] Granulation Amount: [10:Large (67-100%)] [9:Large (67-100%)] [N/A:N/A] Granulation Quality: [10:Red] [9:Red, Pink] [N/A:N/A] Necrotic Amount: [10:Small (1-33%)] [9:None Present (0%)] [N/A:N/A] Exposed Structures: [10:Fat Layer (Subcutaneous Fat Layer (Subcutaneous N/A Tissue) Exposed: Yes Muscle: Yes Fascia: No Tendon: No Joint:  No Bone: No] [9:Tissue) Exposed: Yes Fascia: No Tendon: No Muscle: No Joint: No Bone: No] Epithelialization: [10:None] [9:Small (1-33%)] [N/A:N/A] Debridement: [10:Debridement - Excisional Debridement - Excisional N/A] Pre-procedure [10:15:58] [9:15:58] [N/A:N/A] Verification/Time Out Taken: Tissue Debrided: [10:Callus, Subcutaneous] [9:Callus, Subcutaneous] [N/A:N/A] Level: [10:Skin/Subcutaneous Tissue] [9:Skin/Subcutaneous Tissue] [N/A:N/A] Debridement Area (sq cm):2.55 [9:11.78] [N/A:N/A] Instrument: [10:Curette] [9:Curette] [N/A:N/A] Bleeding: [10:Moderate] [9:Moderate] [N/A:N/A] Hemostasis Achieved: [10:Silver Nitrate] [9:Silver Nitrate] [N/A:N/A] Procedural Pain: [10:0] [9:0] [N/A:N/A] Post Procedural Pain: [10:0] [9:0] [N/A:N/A] Debridement Treatment Procedure was tolerated [9:Procedure was tolerated] [N/A:N/A] Response: [10:well] [9:well] Post Debridement [10:1.5x1.7x1.7] [9:3.8x3.1x0.4] [N/A:N/A] Measurements L x W x D (cm) Post Debridement [10:3.405] [9:3.701] [N/A:N/A] Volume: (cm) Procedures Performed: Debridement [9:Debridement Total Contact Cast] [N/A:N/A] Treatment Notes Wound #10 (Left, Lateral, Plantar Foot) 1. Cleanse With Wound Cleanser 3. Primary Dressing Applied Calcium Alginate Ag 4. Secondary Dressing Dry Gauze Roll Gauze Foam 5. Secured With Medipore tape Notes foam donut as secondary. stockinette Wound #9 (Right, Medial Foot) 1. Cleanse With Wound Cleanser 3. Primary Dressing Applied Calcium Alginate Ag 4. Secondary Dressing Dry Gauze Roll Gauze Foam Drawtex 5. Secured With Medipore tape 7. Footwear/Offloading device applied Total Contact Cast Electronic Signature(s) Signed: 03/11/2019 5:55:43 PM By: Baltazar Najjar MD Signed: 03/14/2019 8:56:01 AM By: Zandra Abts RN, BSN Entered By: Baltazar Najjar on 03/11/2019 17:28:58 -------------------------------------------------------------------------------- Multi-Disciplinary Care Plan  Details Patient Name: Date of Service: [name] 03/11/2019 3:15 PM Medical Record [id number] Patient Account Number: [id number] Date of Birth/Sex: Treating RN: 1955/03/02 (63 y.o. [name] Primary Care Benjamine Strout: Chevis Pretty Other Clinician: Referring Roxanne Panek: Treating Jaymian Bogart/Extender:Robson, [name], [name] Weeks in Treatment: 6 Active Inactive Wound/Skin Impairment Nursing Diagnoses: Impaired tissue integrity Knowledge deficit related to ulceration/compromised skin integrity Goals: Patient/caregiver will verbalize understanding of skin care regimen Date Initiated: 01/28/2019 Target Resolution Date: 03/29/2019 Goal Status: Active Interventions: Assess patient/caregiver ability to obtain necessary supplies Assess patient/caregiver ability to perform ulcer/skin care regimen upon admission and as needed Assess ulceration(s) every visit Provide education on ulcer and skin care Notes: Electronic Signature(s) Signed: 03/14/2019 8:56:01 AM By: Zandra Abts RN, BSN Entered By: Zandra Abts on 03/11/2019 18:36:43 -------------------------------------------------------------------------------- Pain Assessment Details Patient Name: Date of Service: [name] 03/11/2019 3:15 PM Medical Record [id number] Patient Account Number: [id number] Date of Birth/Sex: Treating RN: 1955/11/14 (64 y.o. [name] Primary Care Stephana Morell: Chevis Pretty Other Clinician: Referring Malikah Lakey: Treating Brentley Horrell/Extender:Robson, [name], [name] Weeks in Treatment: 6 Active Problems Location of Pain Severity and Description of Pain Patient Has Paino No Patient Has Paino No Site Locations Pain Management and Medication Current Pain Management: Electronic Signature(s) Signed: 03/11/2019 5:29:42 PM By: [name] Entered By: [name] on 03/11/2019  15:23:29 -------------------------------------------------------------------------------- Patient/Caregiver Education Details Patient Name: [name] 2/22/2021andnbsp3:15 Date of Service: PM Medical Record [id number] Number: Patient Account Number: [id number] Date of Birth/Gender: 1955/12/03 (63 y.o. F) Treating RN: Zandra Abts Primary Care Other Clinician: Maryla Morrow, Physician: Shawna Orleans  Treating Baltazar Najjar Physician/Extender: Maryla Morrow, Referring Physician: Glorianne Manchester in Treatment: 6 Education Assessment Education Provided To: Patient Education Topics Provided Wound/Skin Impairment: Methods: Explain/Verbal Responses: State content correctly Electronic Signature(s) Signed: 03/14/2019 8:56:01 AM By: Zandra Abts RN, BSN Entered By: Zandra Abts on 03/11/2019 18:36:52 -------------------------------------------------------------------------------- Wound Assessment Details Patient Name: Date of Service: [name] 03/11/2019 3:15 PM Medical Record [id number] Patient Account Number: [id number] Date of Birth/Sex: Treating RN: [birth year] (64 y.o. [name] Primary Care Erland Vivas: Chevis Pretty Other Clinician: Referring Abdoulaye Drum: Treating Koi Zangara/Extender:Robson, [name], [facility] Weeks in Treatment: 6 Wound Status Wound Number: 10 Primary Diabetic Wound/Ulcer of the Lower Extremity Etiology: Wound Location: Left Foot - Plantar, Lateral Wound Open Wounding Event: Blister Status: Date Acquired: 11/18/2018 Comorbid Glaucoma, Chronic sinus Weeks Of Treatment: 6 History: problems/congestion, Anemia, Sleep Apnea, Clustered Wound: No Hypertension, Type II Diabetes, Rheumatoid Arthritis, Neuropathy Photos Wound Measurements Length: (cm) 1.5 % Reduction in Width: (cm) 1.7 % Reduction in Depth: (cm) 1.7 Epithelializat Area: (cm) 2.003 Tunneling: Volume: (cm) 3.405 Undermining: Starting Po Ending  Posi Maximum Dis Area: -13.9% Volume: -29% ion: None No Yes sition (o'clock): 12 tion (o'clock): 4 tance: (cm) 1 Wound Description Classification: Grade 2 Foul Odor Afte Wound Margin: Thickened Slough/Fibrino Exudate Amount: Medium Exudate Type: Serosanguineous Exudate Color: red, brown Wound Bed Granulation Amount: Large (67-100%) Granulation Quality: Red Fascia Exposed Necrotic Amount: Small (1-33%) Fat Layer (Sub Necrotic Quality: Adherent Slough Tendon Exposed Muscle Exposed Necrosis o Joint Expo Bone Expos r Cleansing: No Yes Exposed Structure : No cutaneous Tissue) Exposed: Yes : No : Yes f Muscle: No sed: No ed: No Treatment Notes Wound #10 (Left, Lateral, Plantar Foot) 1. Cleanse With Wound Cleanser 3. Primary Dressing Applied Calcium Alginate Ag 4. Secondary Dressing Dry Gauze Roll Gauze Foam 5. Secured With Medipore tape Notes foam donut as secondary. stockinette Electronic Signature(s) Signed: 03/12/2019 5:16:59 PM By: Benjaman Kindler EMT/HBOT Signed: 03/14/2019 8:56:01 AM By: Zandra Abts RN, BSN Previous Signature: 03/11/2019 5:29:42 PM Version By: [name] Entered By: Benjaman Kindler on 03/12/2019 14:17:54 -------------------------------------------------------------------------------- Wound Assessment Details Patient Name: Date of Service: [name], [name] 03/11/2019 3:15 PM Medical Record [id number] Patient Account Number: [id number] Date of Birth/Sex: Treating RN: 06-29-1955 (63 y.o. [name] Primary Care Aamira Bischoff: Chevis Pretty Other Clinician: Referring Donterrius Santucci: Treating Amyriah Buras/Extender:Robson, [name], [facility] Weeks in Treatment: 6 Wound Status Wound Number: 9 Primary Diabetic Wound/Ulcer of the Lower Extremity Etiology: Wound Location: Right Foot - Medial Wound Open Wounding Event: Blister Status: Date Acquired: 06/18/2018 Comorbid Glaucoma, Chronic sinus Weeks Of Treatment:  6 History: problems/congestion, Anemia, Sleep Apnea, Clustered Wound: No Hypertension, Type II Diabetes, Rheumatoid Arthritis, Neuropathy Photos Wound Measurements Length: (cm) 3.8 Width: (cm) 3.1 Depth: (cm) 0.4 Area: (cm) 9.252 Volume: (cm) 3.701 Wound Description Classification: Grade 2 Wound Margin: Thickened Exudate Amount: Medium Exudate Type: Serosanguineous Exudate Color: red, brown Wound Bed Granulation Amount: Large (67-100%) Granulation Quality: Red, Pink Necrotic Amount: None Present (0%) After Cleansing: No brino Yes Exposed Structure osed: No (Subcutaneous Tissue) Exposed: Yes osed: No osed: No sed: No ed: No % Reduction in Area: 15.9% % Reduction in Volume: 62.6% Epithelialization: Small (1-33%) Tunneling: No Undermining: No Foul Odor Slough/Fi Fascia Exp Fat Layer Tendon Exp Muscle Exp Joint Expo Bone Expos Electronic Signature(s) Signed: 03/12/2019 5:16:59 PM By: Benjaman Kindler EMT/HBOT Signed: 03/14/2019 8:56:01 AM By: Zandra Abts RN, BSN Previous Signature: 03/11/2019 5:29:42 PM Version By: [name] Entered By: Benjaman Kindler on 03/12/2019 14:18:20 -------------------------------------------------------------------------------- Vitals Details Patient Name: Date  of Service: [name], [name] 03/11/2019 3:15 PM Medical Record [id number] Patient Account Number: [id number] Date of Birth/Sex: Treating RN: December 16, 1955 (64 y.o. [name] Primary Care Lennyn Gange: Chevis Pretty Other Clinician: Referring Hadessah Grennan: Treating Krissia Schreier/Extender:Robson, [name], [facility] Weeks in Treatment: 6 Vital Signs Time Taken: 15:22 Temperature (F): 98.6 Height (in): 68 Pulse (bpm): 81 Weight (lbs): 265 Respiratory Rate (breaths/min): 19 Body Mass Index (BMI): 40.3 Blood Pressure (mmHg): 157/71 Capillary Blood Glucose (mg/dl): 748 Reference Range: 80 - 120 mg / dl Notes patient stated last CBG was  127 Electronic Signature(s) Signed: 03/11/2019 5:29:42 PM By: [name] Entered By: [name] on 03/11/2019 15:23:23

## 2019-03-15 NOTE — Telephone Encounter (Signed)
Patient called to see if her procedures had been scheduled.  She was informed PICC placemen t on 03-19-2019 at 11:30 with IV antibiotic infusion to follow. [facility] will contact patient regarding in home teaching and IV medications.   Laurell Josephs,  RN

## 2019-03-15 NOTE — Telephone Encounter (Signed)
Patient scheduled for PICC placement Tuesday 3/2 at 12:00 (arrive 11:30). She will have her first dose at short stay after PICC placement.  Orders faxed to [facility] pharmacy and to [facility] Short stay. RN unable to contact patient to relay the appointment time (her phone is not accepting calls at this time).  RN called patient's emergency contact, she will speak with Sharyl Nimrod and have her call. Patient will need to know the appointment info. Andree Coss, RN

## 2019-03-18 ENCOUNTER — Other Ambulatory Visit: Payer: Self-pay

## 2019-03-18 ENCOUNTER — Other Ambulatory Visit (HOSPITAL_COMMUNITY): Payer: Self-pay

## 2019-03-18 ENCOUNTER — Encounter (HOSPITAL_BASED_OUTPATIENT_CLINIC_OR_DEPARTMENT_OTHER): Payer: Medicare Other | Attending: Internal Medicine | Admitting: Internal Medicine

## 2019-03-18 DIAGNOSIS — L97514 Non-pressure chronic ulcer of other part of right foot with necrosis of bone: Secondary | ICD-10-CM | POA: Diagnosis not present

## 2019-03-18 DIAGNOSIS — E1142 Type 2 diabetes mellitus with diabetic polyneuropathy: Secondary | ICD-10-CM | POA: Diagnosis not present

## 2019-03-18 DIAGNOSIS — Z6841 Body Mass Index (BMI) 40.0 and over, adult: Secondary | ICD-10-CM | POA: Insufficient documentation

## 2019-03-18 DIAGNOSIS — H409 Unspecified glaucoma: Secondary | ICD-10-CM | POA: Diagnosis not present

## 2019-03-18 DIAGNOSIS — H42 Glaucoma in diseases classified elsewhere: Secondary | ICD-10-CM | POA: Diagnosis not present

## 2019-03-18 DIAGNOSIS — L97524 Non-pressure chronic ulcer of other part of left foot with necrosis of bone: Secondary | ICD-10-CM | POA: Diagnosis not present

## 2019-03-18 DIAGNOSIS — G4733 Obstructive sleep apnea (adult) (pediatric): Secondary | ICD-10-CM | POA: Insufficient documentation

## 2019-03-18 DIAGNOSIS — E1122 Type 2 diabetes mellitus with diabetic chronic kidney disease: Secondary | ICD-10-CM | POA: Diagnosis not present

## 2019-03-18 DIAGNOSIS — N183 Chronic kidney disease, stage 3 unspecified: Secondary | ICD-10-CM | POA: Diagnosis not present

## 2019-03-18 DIAGNOSIS — D631 Anemia in chronic kidney disease: Secondary | ICD-10-CM | POA: Diagnosis not present

## 2019-03-18 DIAGNOSIS — E1139 Type 2 diabetes mellitus with other diabetic ophthalmic complication: Secondary | ICD-10-CM | POA: Diagnosis not present

## 2019-03-18 DIAGNOSIS — E11621 Type 2 diabetes mellitus with foot ulcer: Secondary | ICD-10-CM | POA: Diagnosis present

## 2019-03-18 DIAGNOSIS — E669 Obesity, unspecified: Secondary | ICD-10-CM | POA: Diagnosis not present

## 2019-03-18 DIAGNOSIS — E11610 Type 2 diabetes mellitus with diabetic neuropathic arthropathy: Secondary | ICD-10-CM | POA: Diagnosis not present

## 2019-03-18 DIAGNOSIS — K219 Gastro-esophageal reflux disease without esophagitis: Secondary | ICD-10-CM | POA: Insufficient documentation

## 2019-03-18 DIAGNOSIS — M86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, left ankle and foot: Secondary | ICD-10-CM | POA: Insufficient documentation

## 2019-03-18 DIAGNOSIS — Z8614 Personal history of Methicillin resistant Staphylococcus aureus infection: Secondary | ICD-10-CM | POA: Diagnosis not present

## 2019-03-18 DIAGNOSIS — I129 Hypertensive chronic kidney disease with stage 1 through stage 4 chronic kidney disease, or unspecified chronic kidney disease: Secondary | ICD-10-CM | POA: Insufficient documentation

## 2019-03-18 DIAGNOSIS — M069 Rheumatoid arthritis, unspecified: Secondary | ICD-10-CM | POA: Insufficient documentation

## 2019-03-18 NOTE — Telephone Encounter (Signed)
Received voicemail from Ruby at [facility] stating patient picc placement was rescheduled for 3/3 at 2. Morrie Sheldon states patient is aware of appointment. Will need to reschedule first dose at short stay.  Lorenso Courier, [place]

## 2019-03-18 NOTE — Progress Notes (Addendum)
GENINE, BECKETT ([id number]) Visit Report for 03/18/2019 HPI Details Patient Name: Date of Service: [name], [name] 03/18/2019 3:15 PM Medical Record [id number] Patient Account Number: [id number] Date of Birth/Sex: Treating RN: 11/27/1955 (64 y.o. [name] Primary Care Provider: Dewayne Hatch Other Clinician: Referring Provider: Treating Provider/Extender:Emmeline Winebarger, [name], [facility] Weeks in Treatment: 7 History of Present Illness HPI Description: 64 yrs old bf here for follow up recurrent left charcot foot ulcer. She has DM. She is not a smoker. She states a blister developed 3 months ago at site of previous breakdown from 1 year ago. It was debrided at the podiatrist's office. She went to the ER and then sent here. She denies pain. Xray was negative for osteo. Culture from this clinic negative. 09/08/14 [name] to [name] for Charcot foot. Seen by him and MRI scheduled 09/19/14. Prior silver collagen but this was not ordered last visit, patient has been rinsing with saline alone. Re institute silver collagen every other day. F/u 2 weeks with Dr. Dellia Nims as Labor Day holiday. Supplies ordered. 09/25/14; this is a patient with a Charcot foot on the left. she has a wound over the plantar aspect of her left calcaneus. She has been seen by [name] of orthopedic surgery and has had an MRI. She is apparently going within the next week or 2 for corrective surgery which will involve an external fixator. I don't have any of the details of this. 10/06/14; The patient is a 64 yrs old bf with a left Charcot foot and ulcer on the plantar. 12/01/14 Returns post surgery with [name]. Has follow up visit later this week with him, currently in facility and NWB over this extremity. States no drainage from foot and no current wound care. On exam callus and scab in place, suspect healed. Has external fixator in place. Will defer to [name]  for management, ok to place moisturizing cream over scabs and callus and she will call if she requires follow up here. READMISSION 01/28/2019 Patient was in the clinic in 2016 for a wound on the plantar aspect of her left calcaneus. Predominantly cared for by Dr. [name] she was referred to [name] and had corrective surgery for the position of her left ankle I believe. She had previously been here in 2015 and then prior to that in 2011 2012 that I do not have these records. More recently she has developed fairly extensive wounds on the right medial foot, left lateral foot and a small area on the right medial great toe. Right medial foot wound has been there for 6 to 7 months, left foot wound for 2 to 3 months and the right great toe only over the last few weeks. She has been followed by [name] at Dr. [name] office it sounds as though she has been undergoing callus paring and silver nitrate. The patient has been washing these off with soap and water and plan applying dry dressing Past medical history, type 2 diabetes well controlled with a recent hemoglobin A1c of 7 on 11/16, type 2 diabetes with peripheral neuropathy, previous left Charcot joint surgery bilateral Charcot joints currently, stage III chronic renal failure, hypertension, obstructive sleep apnea, history of MRSA and gastroesophageal reflux. 1/18; this is a patient with bilateral Charcot feet. Plain x-rays that I did of both of these wounds that are either at bone or precariously close to bone were done. On the right foot this showed possible lytic destruction involving the anterior aspect  of the talus which may represent a wound osteomyelitis. An MRI was recommended. On the left there was no definite lytic destruction although the wound is deep undermining's and I think probably requires an MRI on this basis in any case 1/25; patient was supposed to have her MRI last Friday of her bilateral feet however they would not  do it because there was silver alginate in her dressings, will replace with calcium alginate today and will see if we can get her done today 2/1; patient did not get her MRIs done last week because of issues with the MRI department receiving orders to do bilateral feet. She has 2 deep wounds in the setting of Charcot feet deformities bilaterally. Both of them at one point had exposed bone although I had trouble demonstrating that today. I am not sure that we can offload these very aggressively as I watched her leave the clinic last week her gait is very narrow and unsteady. 2/15; the MRI of her right foot did not show osteomyelitis potentially osteonecrosis of the talus. 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 The wounds are a large wound on the right medial foot and on the left a substantial wound on the left lateral plantar calcaneus. We have been using silver alginate. Ultimately I think we need to consider a total contact cast on the right while we work through the possibility of osteomyelitis in the left. I watched her walk out with her crutches I have some trepidation about the ability to walk with the cast on her right foot. Nevertheless with her Charcot deformities I do not think there is a way to heal these short of offloading them aggressively. 2/22; the culture of the bone in the left foot that I did last week showed a few Citrobacter Koseri as well as some streptococcal species. In my attempted bone for pathology did not actually yield a lot of bone the pathologist did not identify any osteomyelitis. 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 Is promised today and aggressive debridement of the right foot and a total contact cast which surprisingly she seemed to tolerate quite well 03/13/2019  upon evaluation today patient appears to be doing well with her total contact cast she is here for the first cast change. She had no areas of rubbing or any other complications at this point. Overall things are doing quite well. 3/1; the patient was kindly seen by Dr. Orvan Falconer of infectious disease on 2/25. She is now on IV vancomycin PICC line placement in 2 days and oral Flagyl. This was in response to her MRI showing osteomyelitis of the left calcaneus concerning for osteomyelitis. We have been putting her right leg in a total contact cast. Our nurses report drainage. She is tolerating the total contact cast well. Electronic Signature(s) Signed: 03/22/2019 5:21:33 PM By: Baltazar Najjar MD Signed: 05/01/2019 9:05:01 AM By: Zandra Abts RN, BSN Previous Signature: 03/18/2019 5:45:55 PM Version By: Baltazar Najjar MD Entered By: Zandra Abts on 03/21/2019 17:58:43 -------------------------------------------------------------------------------- Physical Exam Details Patient Name: Date of Service: [name] 03/18/2019 3:15 PM Medical Record [id number] Patient Account Number: [id number] Date of Birth/Sex: Treating RN: [birth year] (63 y.o. [name] Primary Care Provider: Chevis Pretty Other Clinician: Referring Provider: Treating Provider/Extender:Parker Wherley, [name], [name] Weeks in Treatment: 7 Constitutional Patient  is hypertensive.. Pulse regular and within target range for patient.Marland Kitchen Respirations regular, non-labored and within target range.. Temperature is normal and within the target range for the patient.Marland Kitchen Appears in no distress. Notes Wound exam; the patient actually appears quite good. The right foot wound has high think debris on the surface but I am going to wait and see how this does before considering debridement. We have reapplied the total contact cast in this area. On the left foot she has a punched-out area but no  undermining Electronic Signature(s) Signed: 03/18/2019 5:45:55 PM By: Baltazar Najjar MD Entered By: Baltazar Najjar on 03/18/2019 17:27:44 -------------------------------------------------------------------------------- Physician Orders Details Patient Name: Date of Service: Sarah Jaeger. 03/18/2019 3:15 PM Medical Record [id number] Patient Account Number: [id number] Date of Birth/Sex: Treating RN: 06/24/1955 (64 y.o. [name] Primary Care Provider: Chevis Pretty Other Clinician: Referring Provider: Treating Provider/Extender:Kam Kushnir, [name], [facility] Weeks in Treatment: 7 Verbal / Phone Orders: No Diagnosis Coding ICD-10 Coding Code Description E11.621 Type 2 diabetes mellitus with foot ulcer L97.524 Non-pressure chronic ulcer of other part of left foot with necrosis of bone L97.511 Non-pressure chronic ulcer of other part of right foot limited to breakdown of skin L97.514 Non-pressure chronic ulcer of other part of right foot with necrosis of bone E11.42 Type 2 diabetes mellitus with diabetic polyneuropathy M14.671 Charcot's joint, right ankle and foot M14.672 Charcot's joint, left ankle and foot Follow-up Appointments Return Appointment in 1 week. - Monday Return Appointment in: - Thursday for cast change Dressing Change Frequency Wound #10 Left,Lateral,Plantar Foot Change Dressing every other day. Wound #9 Right,Medial Foot Do not change entire dressing for one week. Wound Cleansing May shower with protection. Primary Wound Dressing Wound #10 Left,Lateral,Plantar Foot Calcium Alginate with Silver - lightly pack Wound #9 Right,Medial Foot Calcium Alginate with Silver Secondary Dressing Wound #10 Left,Lateral,Plantar Foot Kerlix/Rolled Gauze Dry Gauze Other: - foam donut Wound #9 Right,Medial Foot Kerlix/Rolled Gauze Zetuvit or Kerramax Carboflex Off-Loading Total Contact Cast to Right Lower Extremity Electronic  Signature(s) Signed: 03/18/2019 5:45:55 PM By: Baltazar Najjar MD Signed: 03/18/2019 5:57:14 PM By: Zandra Abts RN, BSN Entered By: Zandra Abts on 03/18/2019 16:41:49 -------------------------------------------------------------------------------- Problem List Details Patient Name: Date of Service: Sarah Jaeger. 03/18/2019 3:15 PM Medical Record [id number] Patient Account Number: [id number] Date of Birth/Sex: Treating RN: [birth year] (64 y.o. [name] Primary Care Provider: Chevis Pretty Other Clinician: Referring Provider: Treating Provider/Extender:Isidra Mings, [name], [name] Weeks in Treatment: 7 Active Problems ICD-10 Evaluated Encounter Code Description Active Date Today Diagnosis E11.621 Type 2 diabetes mellitus with foot ulcer 01/28/2019 No Yes L97.524 Non-pressure chronic ulcer of other part of left foot 01/28/2019 No Yes with necrosis of bone L97.511 Non-pressure chronic ulcer of other part of right foot 01/28/2019 No Yes limited to breakdown of skin L97.514 Non-pressure chronic ulcer of other part of right foot 01/28/2019 No Yes with necrosis of bone E11.42 Type 2 diabetes mellitus with diabetic polyneuropathy 01/28/2019 No Yes M14.671 Charcot's joint, right ankle and foot 01/28/2019 No Yes M14.672 Charcot's joint, left ankle and foot 01/28/2019 No Yes Inactive Problems Resolved Problems Electronic Signature(s) Signed: 03/18/2019 5:45:55 PM By: Baltazar Najjar MD Entered By: Baltazar Najjar on 03/18/2019 17:24:44 -------------------------------------------------------------------------------- Progress Note Details Patient Name: Date of Service: Sarah Jaeger. 03/18/2019 3:15 PM Medical Record [id number] Patient Account Number: [id number] Date of Birth/Sex: Treating RN: 1955-10-15 (64 y.o. [name] Primary Care Provider: Chevis Pretty Other Clinician: Referring Provider: Treating Provider/Extender:Chanie Soucek,  [name], [facility] Weeks in Treatment: 7 Subjective History  of Present Illness (HPI) 64 yrs old bf here for follow up recurrent left charcot foot ulcer. She has DM. She is not a smoker. She states a blister developed 3 months ago at site of previous breakdown from 1 year ago. It was debrided at the podiatrist's office. She went to the ER and then sent here. She denies pain. Xray was negative for osteo. Culture from this clinic negative. 09/08/14 [address] [name] to [name] for Charcot foot. Seen by him and MRI scheduled 09/19/14. Prior silver collagen but this was not ordered last visit, patient has been rinsing with saline alone. Re institute silver collagen every other day. F/u 2 weeks with [name] as Labor Day holiday. Supplies ordered. 09/25/14; this is a patient with a Charcot foot on the left. she has a wound over the plantar aspect of her left calcaneus. She has been seen by [name] of orthopedic surgery and has had an MRI. She is apparently going within the next week or 2 for corrective surgery which will involve an external fixator. I don't have any of the details of this. 10/06/14; The patient is a 64 yrs old bf with a left Charcot foot and ulcer on the plantar. 12/01/14 Returns post surgery with [name]. Has follow up visit later this week with him, currently in facility and NWB over this extremity. States no drainage from foot and no current wound care. On exam callus and scab in place, suspect healed. Has external fixator in place. Will defer to [name] for management, ok to place moisturizing cream over scabs and callus and she will call if she requires follow up here. READMISSION 01/28/2019 Patient was in the clinic in 2016 for a wound on the plantar aspect of her left calcaneus. Predominantly cared for by [name] she was referred to [name] and had corrective surgery for the position of her left ankle I believe. She had previously been here  in 2015 and then prior to that in 2011 2012 that I do not have these records. More recently she has developed fairly extensive wounds on the right medial foot, left lateral foot and a small area on the right medial great toe. Right medial foot wound has been there for 6 to 7 months, left foot wound for 2 to 3 months and the right great toe only over the last few weeks. She has been followed by [name] at Dr. Laverta [place] office it sounds as though she has been undergoing callus paring and silver nitrate. The patient has been washing these off with soap and water and plan applying dry dressing Past medical history, type 2 diabetes well controlled with a recent hemoglobin A1c of 7 on 11/16, type 2 diabetes with peripheral neuropathy, previous left Charcot joint surgery bilateral Charcot joints currently, stage III chronic renal failure, hypertension, obstructive sleep apnea, history of MRSA and gastroesophageal reflux. 1/18; this is a patient with bilateral Charcot feet. Plain x-rays that I did of both of these wounds that are either at bone or precariously close to bone were done. On the right foot this showed possible lytic destruction involving the anterior aspect of the talus which may represent a wound osteomyelitis. An MRI was recommended. On the left there was no definite lytic destruction although the wound is deep undermining's and I think probably requires an MRI on this basis in any case 1/25; patient was supposed to have her MRI last Friday of her bilateral feet however they would not  do it because there was silver alginate in her dressings, will replace with calcium alginate today and will see if we can get her done today 2/1; patient did not get her MRIs done last week because of issues with the MRI department receiving orders to do bilateral feet. She has 2 deep wounds in the setting of Charcot feet deformities bilaterally. Both of them at one point had exposed bone although I had  trouble demonstrating that today. I am not sure that we can offload these very aggressively as I watched her leave the clinic last week her gait is very narrow and unsteady. 2/15; the MRI of her right foot did not show osteomyelitis potentially osteonecrosis of the talus. 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 The wounds are a large wound on the right medial foot and on the left a substantial wound on the left lateral plantar calcaneus. We have been using silver alginate. Ultimately I think we need to consider a total contact cast on the right while we work through the possibility of osteomyelitis in the left. I watched her walk out with her crutches I have some trepidation about the ability to walk with the cast on her right foot. Nevertheless with her Charcot deformities I do not think there is a way to heal these short of offloading them aggressively. 2/22; the culture of the bone in the left foot that I did last week showed a few Citrobacter Koseri as well as some streptococcal species. In my attempted bone for pathology did not actually yield a lot of bone the pathologist did not identify any osteomyelitis. 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 Is promised today and aggressive debridement of the right foot and a total contact cast which surprisingly she seemed to tolerate quite well 03/13/2019 upon evaluation today patient appears to be doing well with her total contact cast she is here for the first cast change. She had no areas of rubbing or any other complications at this point. Overall things are doing quite well. 3/1; the patient was kindly seen by Dr. Orvan Falconer of infectious disease on 2/25. She is now on IV vancomycin PICC line placement in 2 days and oral Flagyl. This was in response to  her MRI showing osteomyelitis of the left calcaneus concerning for osteomyelitis. We have been putting her right leg in a total contact cast. Our nurses report drainage. She is tolerating the total contact cast well. Objective Constitutional Patient is hypertensive.. Pulse regular and within target range for patient.Marland Kitchen Respirations regular, non-labored and within target range.. Temperature is normal and within the target range for the patient.Marland Kitchen Appears in no distress. Vitals Time Taken: 4:02 PM, Height: 68 in, Weight: 265 lbs, BMI: 40.3, Temperature: 98.1 F, Pulse: 74 bpm, Respiratory Rate: 18 breaths/min, Blood Pressure: 178/82 mmHg. General Notes: Wound exam; the patient actually appears quite good. The right foot wound has high think debris on the surface but I am going to wait and see how this does before considering debridement. We have reapplied the total contact cast in this area. ooOn the left foot she has a punched-out area but no undermining Integumentary (Hair, Skin) Wound #10 status is Open. Original cause of wound was Blister. The wound is located on the Left,Lateral,Plantar Foot. The wound measures 1.4cm length x 1.6cm  width x 1.7cm depth; 1.759cm^2 area and 2.991cm^3 volume. There is bone, muscle, and Fat Layer (Subcutaneous Tissue) Exposed exposed. There is no tunneling or undermining noted. There is a medium amount of serosanguineous drainage noted. The wound margin is thickened. There is large (67-100%) red granulation within the wound bed. There is a small (1-33%) amount of necrotic tissue within the wound bed including Adherent Slough. Wound #9 status is Open. Original cause of wound was Blister. The wound is located on the Right,Medial Foot. The wound measures 3.9cm length x 2.9cm width x 0.2cm depth; 8.883cm^2 area and 1.777cm^3 volume. There is Fat Layer (Subcutaneous Tissue) Exposed exposed. There is no tunneling or undermining noted. There is a large amount of  serosanguineous drainage noted. The wound margin is thickened. There is large (67-100%) red, pink granulation within the wound bed. There is a small (1-33%) amount of necrotic tissue within the wound bed including Adherent Slough. General Notes: callus periwound. Assessment Active Problems ICD-10 Type 2 diabetes mellitus with foot ulcer Non-pressure chronic ulcer of other part of left foot with necrosis of bone Non-pressure chronic ulcer of other part of right foot limited to breakdown of skin Non-pressure chronic ulcer of other part of right foot with necrosis of bone Type 2 diabetes mellitus with diabetic polyneuropathy Charcot's joint, right ankle and foot Charcot's joint, left ankle and foot Procedures Wound #9 Pre-procedure diagnosis of Wound #9 is a Diabetic Wound/Ulcer of the Lower Extremity located on the Right,Medial Foot . There was a Total Contact Cast Procedure by [name], MD. Post procedure Diagnosis Wound #9: Same as Pre-Procedure Plan Follow-up Appointments: Return Appointment in 1 week. - Monday Return Appointment in: - Thursday for cast change Dressing Change Frequency: Wound #10 Left,Lateral,Plantar Foot: Change Dressing every other day. Wound #9 Right,Medial Foot: Do not change entire dressing for one week. Wound Cleansing: May shower with protection. Primary Wound Dressing: Wound #10 Left,Lateral,Plantar Foot: Calcium Alginate with Silver - lightly pack Wound #9 Right,Medial Foot: Calcium Alginate with Silver Secondary Dressing: Wound #10 Left,Lateral,Plantar Foot: Kerlix/Rolled Gauze Dry Gauze Other: - foam donut Wound #9 Right,Medial Foot: Kerlix/Rolled Gauze Zetuvit or Kerramax Carboflex Off-Loading: Total Contact Cast to Right Lower Extremity 1. continue with silver alginate to both wound areas 2. The patient is beginning treatment for the osteomyelitis in the left calcaneus 3. 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 This actually might benefit from total contact cast as well. 4. Consider debridement of the right foot wound careful attention to the measurements when we see her the next time 5. We are going to bring her back for a cast change because of the drainage issue on the right we will continue to monitor this Electronic Signature(s) Signed: 03/22/2019 5:21:33 PM By: Baltazar Najjar MD Signed: 05/01/2019 9:05:01 AM By: Zandra Abts RN, BSN Previous Signature: 03/18/2019 5:45:55 PM Version By: Baltazar Najjar MD Entered By: Zandra Abts on 03/21/2019 17:58:55 -------------------------------------------------------------------------------- Total Contact Cast Details Patient Name: Date of Service: [name] 03/18/2019 3:15 PM Medical Record [id number] Patient Account Number: [id number] Date of Birth/Sex: Treating RN: Nov 09, 1955 (64 y.o. [name] Primary Care Provider: Chevis Pretty Other Clinician: Referring Provider: Treating Provider/Extender:Sloka Volante, [name], [name] Weeks in Treatment: 7 Total Contact Cast Applied for Wound Assessment: Wound #9 Right,Medial Foot Performed By: Physician [name], MD Post Procedure Diagnosis Same as Pre-procedure Electronic Signature(s) Signed: 03/18/2019 5:45:55 PM By: Baltazar Najjar MD Entered By: Baltazar Najjar  on 03/18/2019 17:30:22 -------------------------------------------------------------------------------- SuperBill Details Patient Name: Date of Service: [name], [name] 03/18/2019 Medical Record [id number] Patient Account Number: [id number] Date of Birth/Sex: Treating RN: Jan 02, 1956 (64 y.o. [name] Primary Care Provider: Chevis Pretty Other Clinician: Referring Provider: Treating Provider/Extender:Cohen Boettner, [name], [facility] Weeks in Treatment: 7 Diagnosis Coding ICD-10 Codes Code Description E11.621 Type 2  diabetes mellitus with foot ulcer L97.524 Non-pressure chronic ulcer of other part of left foot with necrosis of bone L97.511 Non-pressure chronic ulcer of other part of right foot limited to breakdown of skin L97.514 Non-pressure chronic ulcer of other part of right foot with necrosis of bone E11.42 Type 2 diabetes mellitus with diabetic polyneuropathy M14.671 Charcot's joint, right ankle and foot M14.672 Charcot's joint, left ankle and foot Facility Procedures CPT4 Code Description: [id number] - APPLY TOTAL CONTACT LEG CAST ICD-10 Diagnosis Description L97.511 Non-pressure chronic ulcer of other part of right foot limit Modifier: ed to breakdo Quantity: 1 wn of skin Physician Procedures CPT4 Code Description: [id number] - WC PHYS APPLY TOTAL CONTACT CAST ICD-10 Diagnosis Description L97.511 Non-pressure chronic ulcer of other part of right foot limite Modifier: d to breakdo Quantity: 1 wn of skin Electronic Signature(s) Signed: 03/18/2019 5:45:55 PM By: Baltazar Najjar MD Entered By: Baltazar Najjar on 03/18/2019 17:30:08

## 2019-03-18 NOTE — Telephone Encounter (Signed)
Rescheduled first dose for short stay on 3/3 after picc placement. Spoke with [name] to make aware of appointment changes. Advance was also informed. [name]

## 2019-03-19 ENCOUNTER — Ambulatory Visit (HOSPITAL_COMMUNITY): Admission: RE | Admit: 2019-03-19 | Payer: Medicare Other | Source: Ambulatory Visit

## 2019-03-19 ENCOUNTER — Inpatient Hospital Stay (HOSPITAL_COMMUNITY): Admission: RE | Admit: 2019-03-19 | Payer: Medicare Other | Source: Ambulatory Visit

## 2019-03-20 ENCOUNTER — Encounter (HOSPITAL_COMMUNITY)
Admission: RE | Admit: 2019-03-20 | Discharge: 2019-03-20 | Disposition: A | Discharge status: Home / Self Care | Payer: Medicare Other | Source: Ambulatory Visit | Attending: Internal Medicine | Admitting: Internal Medicine

## 2019-03-20 ENCOUNTER — Other Ambulatory Visit: Payer: Self-pay

## 2019-03-20 ENCOUNTER — Other Ambulatory Visit: Payer: Self-pay | Admitting: Internal Medicine

## 2019-03-20 ENCOUNTER — Ambulatory Visit (HOSPITAL_COMMUNITY)
Admission: RE | Admit: 2019-03-20 | Discharge: 2019-03-20 | Disposition: A | Discharge status: Home / Self Care | Payer: Medicare Other | Source: Ambulatory Visit | Attending: Internal Medicine | Admitting: Internal Medicine

## 2019-03-20 DIAGNOSIS — M86072 Acute hematogenous osteomyelitis, left ankle and foot: Secondary | ICD-10-CM

## 2019-03-20 MED ORDER — SODIUM CHLORIDE 0.9 % IV SOLN
2.0000 g | Freq: Once | INTRAVENOUS | Status: AC
  Administered 2019-03-20: 2 g via INTRAVENOUS

## 2019-03-20 MED ORDER — LIDOCAINE HCL 1 % IJ SOLN
INTRAMUSCULAR | Status: DC | PRN
  Administered 2019-03-20: 10 mL

## 2019-03-20 MED ORDER — LIDOCAINE HCL 1 % IJ SOLN
INTRAMUSCULAR | Status: AC
  Filled 2019-03-20: qty 20

## 2019-03-20 MED ORDER — HEPARIN SOD (PORK) LOCK FLUSH 100 UNIT/ML IV SOLN: 250.0000 [IU] | Freq: Every day | INTRAVENOUS | Status: DC

## 2019-03-20 MED ORDER — HEPARIN SOD (PORK) LOCK FLUSH 100 UNIT/ML IV SOLN: 250.0000 [IU] | INTRAVENOUS | Status: DC | PRN

## 2019-03-20 MED ORDER — HEPARIN SOD (PORK) LOCK FLUSH 100 UNIT/ML IV SOLN
INTRAVENOUS | Status: AC
  Administered 2019-03-20: 250 [IU]
  Filled 2019-03-20: qty 5

## 2019-03-20 MED ORDER — SODIUM CHLORIDE 0.9 % IV SOLN
INTRAVENOUS | Status: AC
  Filled 2019-03-20: qty 20

## 2019-03-21 ENCOUNTER — Encounter (HOSPITAL_BASED_OUTPATIENT_CLINIC_OR_DEPARTMENT_OTHER): Payer: Medicare Other | Admitting: Internal Medicine

## 2019-03-21 DIAGNOSIS — E11621 Type 2 diabetes mellitus with foot ulcer: Secondary | ICD-10-CM | POA: Diagnosis not present

## 2019-03-21 NOTE — Progress Notes (Signed)
[name] ([id number]) Visit Report for 03/21/2019 Arrival Information Details Patient Name: Date of Service: [name] 03/21/2019 11:15 AM Medical Record [id number] Patient Account Number: [id number] Date of Birth/Sex: Treating RN: 08-30-1955 (64 y.o. [name] Primary Care Shardai Star: Chevis Pretty Other Clinician: Referring Maeson Lourenco: Treating Brigette Hopfer/Extender:Robson, [name], [facility] Weeks in Treatment: 7 Visit Information History Since Last Visit Added or deleted any medications: No Patient Arrived: Crutches Any new allergies or adverse reactions: No Arrival Time: 11:22 Had a fall or experienced change in No Accompanied By: alone activities of daily living that may affect Transfer Assistance: None risk of falls: Patient Identification Verified: Yes Signs or symptoms of abuse/neglect since No Secondary Verification Process Completed: Yes last visito Patient Requires Transmission-Based No Hospitalized since last visit: No Precautions: Implantable device outside of the clinic No Patient Has Alerts: No excluding cellular tissue based products placed in the center since last visit: Has Dressing in Place as Prescribed: Yes Has Footwear/Offloading in Place as Yes Prescribed: Right: Total Contact Cast Pain Present Now: No Electronic Signature(s) Signed: 03/21/2019 5:39:26 PM By: Zandra Abts RN, BSN Entered By: Zandra Abts on 03/21/2019 11:22:45 -------------------------------------------------------------------------------- Encounter Discharge Information Details Patient Name: Date of Service: [name]. 03/21/2019 11:15 AM Medical Record [id number] Patient Account Number: [id number] Date of Birth/Sex: Treating RN: 24-Sep-1955 (64 y.o. [name] Primary Care Chastin Garlitz: Chevis Pretty Other Clinician: Referring Iracema Lanagan: Treating Fedora Knisely/Extender:Robson, [name], [facility] Weeks  in Treatment: 7 Encounter Discharge Information Items Discharge Condition: Stable Ambulatory Status: Crutches Discharge Destination: Home Transportation: Private Auto Accompanied By: alone Schedule Follow-up Appointment: Yes Clinical Summary of Care: Patient Declined Electronic Signature(s) Signed: 03/21/2019 5:39:26 PM By: Zandra Abts RN, BSN Entered By: Zandra Abts on 03/21/2019 17:13:29 -------------------------------------------------------------------------------- Lower Extremity Assessment Details Patient Name: Date of Service: [name]. 03/21/2019 11:15 AM Medical Record [id number] Patient Account Number: [id number] Date of Birth/Sex: Treating RN: 1955/08/27 (63 y.o. [name] Primary Care Justus Duerr: Chevis Pretty Other Clinician: Referring Milina Pagett: Treating Kaivon Livesey/Extender:Robson, [name], [facility] Weeks in Treatment: 7 Edema Assessment Assessed: [Left: No] [Right: No] Edema: [Left: Yes] [Right: Yes] Calf Left: Right: Point of Measurement: 43 cm From Medial Instep 36 cm 36 cm Ankle Left: Right: Point of Measurement: 12 cm From Medial Instep 26 cm 29.5 cm Electronic Signature(s) Signed: 03/21/2019 5:39:26 PM By: Zandra Abts RN, BSN Entered By: Zandra Abts on 03/21/2019 12:08:15 -------------------------------------------------------------------------------- Multi Wound Chart Details Patient Name: Date of Service: [name]. 03/21/2019 11:15 AM Medical Record [id number] Patient Account Number: [id number] Date of Birth/Sex: Treating RN: Jul 13, 1955 (64 y.o. 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] Comorbid History: [9:Glaucoma, Chronic sinus N/A problems/congestion, Anemia, Sleep Apnea, Hypertension, Type II Diabetes, Rheumatoid Arthritis, Neuropathy] Date Acquired: [9:06/18/2018] [N/A:N/A] Weeks of Treatment: [9:7] [N/A:N/A] Wound Status: [9:Open] [N/A:N/A] Measurements L x W x D 3.9x2.9x0.2 [N/A:N/A] (cm) Area (cm) : [9:8.883] [N/A:N/A] Volume (cm) : [9:1.777] [N/A:N/A] % Reduction in Area: [9:19.20%] [N/A:N/A] % Reduction in Volume: 82.00% [N/A:N/A] Classification: [9:Grade 2] [N/A:N/A] Exudate Amount: [9:Large] [N/A:N/A] Exudate Type: [9:Serosanguineous] [N/A:N/A] Exudate Color: [9:red, brown] [N/A:N/A] Wound Margin: [9:Thickened] [N/A:N/A] Granulation Amount: [9:Large (67-100%)] [N/A:N/A] Granulation Quality: [9:Red, Pink] [N/A:N/A] Necrotic Amount: [9:Small (  1-33%)] [N/A:N/A] Exposed Structures: [9:Fat Layer (Subcutaneous N/A Tissue) Exposed: Yes Fascia: No Tendon: No Muscle: No Joint: No Bone: No] Epithelialization: [9:Small (1-33%)] [N/A:N/A N/A] Treatment Notes Electronic Signature(s) Signed: 03/21/2019 5:41:03 PM By: Linton Ham MD Entered By: Linton Ham on 03/21/2019 12:23:41 -------------------------------------------------------------------------------- Multi-Disciplinary Care Plan Details Patient Name: Date of Service: [name]. 03/21/2019 11:15 AM Medical Record [id number] Patient Account Number: [id number] Date of Birth/Sex: Treating RN: [birth year] (64 y.o. [name] Primary Care Deardra Hinkley: Dewayne Hatch Other Clinician: Referring Castin Donaghue: Treating Shyana Kulakowski/Extender:Robson, [name], [facility] Weeks in Treatment: 7 Active Inactive Wound/Skin Impairment Nursing  Diagnoses: Impaired tissue integrity Knowledge deficit related to ulceration/compromised skin integrity Goals: Patient/caregiver will verbalize understanding of skin care regimen Date Initiated: 01/28/2019 Target Resolution Date: 03/29/2019 Goal Status: Active Interventions: Assess patient/caregiver ability to obtain necessary supplies Assess patient/caregiver ability to perform ulcer/skin care regimen upon admission and as needed Assess ulceration(s) every visit Provide education on ulcer and skin care Notes: Electronic Signature(s) Signed: 03/21/2019 5:39:26 PM By: Levan Hurst RN, BSN Entered By: Levan Hurst on 03/21/2019 13:00:40 -------------------------------------------------------------------------------- Pain Assessment Details Patient Name: Date of Service: [name]. 03/21/2019 11:15 AM Medical Record [id number] Patient Account Number: [id number] Date of Birth/Sex: Treating RN: [birth year] (64 y.o. [name] Primary Care Reuven Braver: Dewayne Hatch Other Clinician: Referring Ettel Albergo: Treating Maynor Mwangi/Extender:Robson, [name], [facility] Weeks in Treatment: 7 Active Problems Location of Pain Severity and Description of Pain Patient Has Paino No Patient Has Paino No Site Locations Pain Management and Medication Current Pain Management: Electronic Signature(s) Signed: 03/21/2019 5:39:26 PM By: Levan Hurst RN, BSN Entered By: Levan Hurst on 03/21/2019 12:08:11 -------------------------------------------------------------------------------- Patient/Caregiver Education Details Patient Name: [name] 3/4/2021andnbsp11:15 Date of Service: AM Medical Record [id number] Number: Patient Account Number: [id number] Date of Birth/Gender: 01/19/1955 (63 y.o. F) Treating RN: Levan Hurst Primary Care Other Clinician: Myrtie Hawk, Physician: Lidia Collum Physician/ExtenderMyrtie Hawk, Referring  Physician: Tenna Delaine in Treatment: 7 Education Assessment Education Provided To: Patient Education Topics Provided Wound/Skin Impairment: Methods: Explain/Verbal Responses: State content correctly Electronic Signature(s) Signed: 03/21/2019 5:39:26 PM By: Levan Hurst RN, BSN Entered By: Levan Hurst on 03/21/2019 13:00:53 -------------------------------------------------------------------------------- Wound Assessment Details Patient Name: Date of Service: [name]. 03/21/2019 11:15 AM Medical Record [id number] Patient Account Number: [id number] Date of Birth/Sex: Treating RN: June 23, 1955 (63 y.o. [name] Primary Care Orion Mole: Dewayne Hatch Other Clinician: Referring Yazleen Molock: Treating Esthefany Herrig/Extender:Robson, [name], [facility] Weeks in Treatment: 7 Wound Status Wound Number: 9 Primary Diabetic Wound/Ulcer of the Lower Extremity Etiology: Wound Location: Right Foot - Medial Wound Open Wounding Event: Blister Status: Date Acquired: 06/18/2018 Comorbid Glaucoma, Chronic sinus Weeks Of Treatment: 7 History: problems/congestion, Anemia, Sleep Apnea, Clustered Wound: No Hypertension, Type II Diabetes, Rheumatoid Arthritis, Neuropathy Wound Measurements Length: (cm) 3.9 Width: (cm) 2.9 Depth: (cm) 0.2 Area: (cm) 8.883 Volume: (cm) 1.777 Wound Description Classification: Grade 2 Wound Margin: Thickened Exudate Amount: Large Exudate Type: Serosanguineous Exudate Color: red, brown Wound Bed Granulation Amount: Large (67-100%) Granulation Quality: Red, Pink Necrotic Amount: Small (1-33%) Necrotic Quality: Adherent Slough After Cleansing: No rino Yes Exposed Structure osed: No (Subcutaneous Tissue) Exposed: Yes osed: No osed: No sed: No ed: No % Reduction in Area: 19.2% % Reduction in Volume: 82% Epithelialization: Small (1-33%) Tunneling: No Undermining: No Foul Odor Slough/Fib Fascia Exp Fat  Layer Tendon Exp Muscle Exp Joint Expo Bone Expos Treatment Notes Wound #9 (Right, Medial Foot) 1. Cleanse With Soap and water 2. Periwound Care Moisturizing lotion 3. Primary Dressing Applied Calcium  Alginate Ag 4. Secondary Dressing Roll Gauze Kerramax/Xtrasorb Other secondary dressing (specify in notes) Notes carboflex Electronic Signature(s) Signed: 03/21/2019 5:39:26 PM By: Zandra Abts RN, BSN Entered By: Zandra Abts on 03/21/2019 12:08:39 -------------------------------------------------------------------------------- Vitals Details Patient Name: Date of Service: [name]. 03/21/2019 11:15 AM Medical Record [id number] Patient Account Number: [id number] Date of Birth/Sex: Treating RN: 1955/03/07 (64 y.o. [name] Primary Care Vishaal Strollo: Chevis Pretty Other Clinician: Referring Layney Gillson: Treating Angellee Cohill/Extender:Robson, [name], [facility] Weeks in Treatment: 7 Vital Signs Time Taken: 11:22 Temperature (F): 97.6 Height (in): 68 Pulse (bpm): 79 Weight (lbs): 265 Respiratory Rate (breaths/min): 18 Body Mass Index (BMI): 40.3 Blood Pressure (mmHg): 140/65 Reference Range: 80 - 120 mg / dl Electronic Signature(s) Signed: 03/21/2019 5:39:26 PM By: Zandra Abts RN, BSN Entered By: Zandra Abts on 03/21/2019 11:23:06

## 2019-03-21 NOTE — Progress Notes (Signed)
NILDA, KEATHLEY ([id number]) Visit Report for 03/21/2019 HPI Details Patient Name: Date of Service: [name], [name] 03/21/2019 11:15 AM Medical Record [id number] Patient Account Number: [id number] Date of Birth/Sex: Treating RN: 12-Aug-1955 (64 y.o. F) Primary Care Provider: Chevis Pretty Other Clinician: Referring Provider: Treating Provider/Extender:Intisar Claudio, [name], [facility] Weeks in Treatment: 7 History of Present Illness HPI Description: 64 yrs old bf here for follow up recurrent left charcot foot ulcer. She has DM. She is not a smoker. She states a blister developed 3 months ago at site of previous breakdown from 1 year ago. It was debrided at the podiatrist's office. She went to the ER and then sent here. She denies pain. Xray was negative for osteo. Culture from this clinic negative. 09/08/[name] to [name] for Charcot foot. Seen by him and MRI scheduled 09/19/14. Prior silver collagen but this was not ordered last visit, patient has been rinsing with saline alone. Re institute silver collagen every other day. F/u 2 weeks with [name] as Labor Day holiday. Supplies ordered. 09/25/14; this is a patient with a Charcot foot on the left. she has a wound over the plantar aspect of her left calcaneus. She has been seen by [name] of orthopedic surgery and has had an MRI. She is apparently going within the next week or 2 for corrective surgery which will involve an external fixator. I don't have any of the details of this. 10/06/14; The patient is a 64 yrs old bf with a left Charcot foot and ulcer on the plantar. 12/01/14 Returns post surgery with [name]. Has follow up visit later this week with him, currently in facility and NWB over this extremity. States no drainage from foot and no current wound care. On exam callus and scab in place, suspect healed. Has external fixator in place. Will defer to [name] for management, ok to  place moisturizing cream over scabs and callus and she will call if she requires follow up here. READMISSION 01/28/2019 Patient was in the clinic in 2016 for a wound on the plantar aspect of her left calcaneus. Predominantly cared for by [name] she was referred to [name] and had corrective surgery for the position of her left ankle I believe. She had previously been here in 2015 and then prior to that in 2011 2012 that I do not have these records. More recently she has developed fairly extensive wounds on the right medial foot, left lateral foot and a small area on the right medial great toe. Right medial foot wound has been there for 6 to 7 months, left foot wound for 2 to 3 months and the right great toe only over the last few weeks. She has been followed by [name] at Dr. Laverta [place] office it sounds as though she has been undergoing callus paring and silver nitrate. The patient has been washing these off with soap and water and plan applying dry dressing Past medical history, type 2 diabetes well controlled with a recent hemoglobin A1c of 7 on 11/16, type 2 diabetes with peripheral neuropathy, previous left Charcot joint surgery bilateral Charcot joints currently, stage III chronic renal failure, hypertension, obstructive sleep apnea, history of MRSA and gastroesophageal reflux. 1/18; this is a patient with bilateral Charcot feet. Plain x-rays that I did of both of these wounds that are either at bone or precariously close to bone were done. On the right foot this showed possible lytic destruction involving the anterior aspect of the  talus which may represent a wound osteomyelitis. An MRI was recommended. On the left there was no definite lytic destruction although the wound is deep undermining's and I think probably requires an MRI on this basis in any case 1/25; patient was supposed to have her MRI last Friday of her bilateral feet however they would not do it because there was  silver alginate in her dressings, will replace with calcium alginate today and will see if we can get her done today 2/1; patient did not get her MRIs done last week because of issues with the MRI department receiving orders to do bilateral feet. She has 2 deep wounds in the setting of Charcot feet deformities bilaterally. Both of them at one point had exposed bone although I had trouble demonstrating that today. I am not sure that we can offload these very aggressively as I watched her leave the clinic last week her gait is very narrow and unsteady. 2/15; the MRI of her right foot did not show osteomyelitis potentially osteonecrosis of the talus. 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 The wounds are a large wound on the right medial foot and on the left a substantial wound on the left lateral plantar calcaneus. We have been using silver alginate. Ultimately I think we need to consider a total contact cast on the right while we work through the possibility of osteomyelitis in the left. I watched her walk out with her crutches I have some trepidation about the ability to walk with the cast on her right foot. Nevertheless with her Charcot deformities I do not think there is a way to heal these short of offloading them aggressively. 2/22; the culture of the bone in the left foot that I did last week showed a few Citrobacter Koseri as well as some streptococcal species. In my attempted bone for pathology did not actually yield a lot of bone the pathologist did not identify any osteomyelitis. 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 Is promised today and aggressive debridement of the right foot and a total contact cast which surprisingly she seemed to tolerate quite well 03/13/2019 upon evaluation today  patient appears to be doing well with her total contact cast she is here for the first cast change. She had no areas of rubbing or any other complications at this point. Overall things are doing quite well. 2/26; the patient was kindly seen by Dr. Orvan Falconer of infectious disease on 2/25. She is now on IV vancomycin PICC line placement in 2 days and oral Flagyl. This was in response to her MRI showing osteomyelitis of the left calcaneus concerning for osteomyelitis. We have been putting her right leg in a total contact cast. Our nurses report drainage. She is tolerating the total contact cast well. 3/4; the patient is started her IV antibiotics and is taking IV vancomycin and oral Flagyl. She appears to be tolerating these both well. This is for osteomyelitis involving the left calcaneus. The area on the right in the setting of bilateral Charcot deformities did not have any bone infection on MRI. We put a total contact cast on her with silver alginate as the primary dressing and she returns today in follow-up. Per our intake nurse there was too much drainage to leave this on all week therefore we will  bring her back for an early change Electronic Signature(s) Signed: 03/21/2019 5:41:03 PM By: Baltazar Najjar MD Entered By: Baltazar Najjar on 03/21/2019 12:25:02 -------------------------------------------------------------------------------- Physical Exam Details Patient Name: Date of Service: [name] 03/21/2019 11:15 AM Medical Record [id number] Patient Account Number: [id number] Date of Birth/Sex: Treating RN: 09/22/1955 (64 y.o. F) Primary Care Provider: Chevis Pretty Other Clinician: Referring Provider: Treating Provider/Extender:Shera Laubach, [name], [facility] Weeks in Treatment: 7 Constitutional Patient is hypertensive.. Pulse regular and within target range for patient.Marland Kitchen Respirations regular, non-labored and within target range.. Temperature is normal and  within the target range for the patient.Marland Kitchen Appears in no distress. Notes Wound exam; the patient was brought in for a total contact cast change. This was done in the standard fashion per our intake nurse the wound looked quite good. Still too much drainage not to change this in mid week. Hopefully the antibiotics will help with this. Electronic Signature(s) Signed: 03/21/2019 5:41:03 PM By: Baltazar Najjar MD Entered By: Baltazar Najjar on 03/21/2019 12:26:01 -------------------------------------------------------------------------------- Physician Orders Details Patient Name: Date of Service: [name] 03/21/2019 11:15 AM Medical Record [id number] Patient Account Number: [id number] Date of Birth/Sex: Treating RN: January 20, 1955 (63 y.o. [name] Primary Care Provider: Chevis Pretty Other Clinician: Referring Provider: Treating Provider/Extender:Azlan Hanway, [name], [facility] Weeks in Treatment: 7 Verbal / Phone Orders: No Diagnosis Coding ICD-10 Coding Code Description E11.621 Type 2 diabetes mellitus with foot ulcer L97.524 Non-pressure chronic ulcer of other part of left foot with necrosis of bone L97.511 Non-pressure chronic ulcer of other part of right foot limited to breakdown of skin L97.514 Non-pressure chronic ulcer of other part of right foot with necrosis of bone E11.42 Type 2 diabetes mellitus with diabetic polyneuropathy M14.671 Charcot's joint, right ankle and foot M14.672 Charcot's joint, left ankle and foot Follow-up Appointments Return Appointment in 1 week. - Monday Return Appointment in: - Thursday for cast change Dressing Change Frequency Wound #10 Left,Lateral,Plantar Foot Change Dressing every other day. Wound #9 Right,Medial Foot Do not change entire dressing for one week. Wound Cleansing May shower with protection. Primary Wound Dressing Wound #10 Left,Lateral,Plantar Foot Calcium Alginate with Silver - lightly  pack Wound #9 Right,Medial Foot Calcium Alginate with Silver Secondary Dressing Wound #10 Left,Lateral,Plantar Foot Kerlix/Rolled Gauze Dry Gauze Other: - foam donut Wound #9 Right,Medial Foot Kerlix/Rolled Gauze Zetuvit or Kerramax Carboflex Off-Loading Total Contact Cast to Right Lower Extremity Electronic Signature(s) Signed: 03/21/2019 5:39:26 PM By: Zandra Abts RN, BSN Signed: 03/21/2019 5:41:03 PM By: Baltazar Najjar MD Entered By: Zandra Abts on 03/21/2019 13:00:33 -------------------------------------------------------------------------------- Problem List Details Patient Name: Date of Service: [name] 03/21/2019 11:15 AM Medical Record [id number] Patient Account Number: [id number] Date of Birth/Sex: Treating RN: Oct 26, 1955 (64 y.o. F) Primary Care Provider: Chevis Pretty Other Clinician: Referring Provider: Treating Provider/Extender:[place] Sloop, [name], [facility] Weeks in Treatment: 7 Active Problems ICD-10 Evaluated Encounter Code Description Active Date Today Diagnosis E11.621 Type 2 diabetes mellitus with foot ulcer 01/28/2019 No Yes L97.524 Non-pressure chronic ulcer of other part of left foot 01/28/2019 No Yes with necrosis of bone L97.511 Non-pressure chronic ulcer of other part of right foot 01/28/2019 No Yes limited to breakdown of skin L97.514 Non-pressure chronic ulcer of other part of right foot 01/28/2019 No Yes with necrosis of bone E11.42 Type 2 diabetes mellitus with diabetic polyneuropathy 01/28/2019 No Yes M14.671 Charcot's joint, right ankle and foot 01/28/2019 No Yes M14.672 Charcot's joint, left ankle and foot 01/28/2019 No Yes Inactive Problems Resolved Problems Electronic Signature(s) Signed: 03/21/2019 5:41:03 PM  By: Linton Ham MD Entered By: Linton Ham on 03/21/2019 12:23:35 -------------------------------------------------------------------------------- Progress Note Details Patient Name: Date of  Service: [facility] 03/21/2019 11:15 AM Medical Record [id number] Patient Account Number: [id number] Date of Birth/Sex: Treating RN: Aug 21, 1955 (63 y.o. F) Primary Care Provider: Dewayne Hatch Other Clinician: Referring Provider: Treating Provider/Extender:Lamone Ferrelli, [name], [facility] Weeks in Treatment: 7 Subjective History of Present Illness (HPI) 64 yrs old bf here for follow up recurrent left charcot foot ulcer. She has DM. She is not a smoker. She states a blister developed 3 months ago at site of previous breakdown from 1 year ago. It was debrided at the podiatrist's office. She went to the ER and then sent here. She denies pain. Xray was negative for osteo. Culture from this clinic negative. 09/08/14 Referred by [name] to [name] for Charcot foot. Seen by him and MRI scheduled 09/19/14. Prior silver collagen but this was not ordered last visit, patient has been rinsing with saline alone. Re institute silver collagen every other day. F/u 2 weeks with Dr. Dellia Nims as Labor Day holiday. Supplies ordered. 09/25/14; this is a patient with a Charcot foot on the left. she has a wound over the plantar aspect of her left calcaneus. She has been seen by [name] of orthopedic surgery and has had an MRI. She is apparently going within the next week or 2 for corrective surgery which will involve an external fixator. I don't have any of the details of this. 10/06/14; The patient is a 64 yrs old bf with a left Charcot foot and ulcer on the plantar. 12/01/14 Returns post surgery with [name]. Has follow up visit later this week with him, currently in facility and [facility] over this extremity. States no drainage from foot and no current wound care. On exam callus and scab in place, suspect healed. Has external fixator in place. Will defer to [name] for management, ok to place moisturizing cream over scabs and callus and she will call if she requires follow up  here. READMISSION 01/28/2019 Patient was in the clinic in 2016 for a wound on the plantar aspect of her left calcaneus. Predominantly cared for by Dr. [name] she was referred to [name] and had corrective surgery for the position of her left ankle I believe. She had previously been here in 2015 and then prior to that in 2011 2012 that I do not have these records. More recently she has developed fairly extensive wounds on the right medial foot, left lateral foot and a small area on the right medial great toe. Right medial foot wound has been there for 6 to 7 months, left foot wound for 2 to 3 months and the right great toe only over the last few weeks. She has been followed by [name] at Dr. [name] office it sounds as though she has been undergoing callus paring and silver nitrate. The patient has been washing these off with soap and water and plan applying dry dressing Past medical history, type 2 diabetes well controlled with a recent hemoglobin A1c of 7 on 11/16, type 2 diabetes with peripheral neuropathy, previous left Charcot joint surgery bilateral Charcot joints currently, stage III chronic renal failure, hypertension, obstructive sleep apnea, history of MRSA and gastroesophageal reflux. 1/18; this is a patient with bilateral Charcot feet. Plain x-rays that I did of both of these wounds that are either at bone or precariously close to bone were done. On the right foot this showed possible lytic destruction  involving the anterior aspect of the talus which may represent a wound osteomyelitis. An MRI was recommended. On the left there was no definite lytic destruction although the wound is deep undermining's and I think probably requires an MRI on this basis in any case 1/25; patient was supposed to have her MRI last Friday of her bilateral feet however they would not do it because there was silver alginate in her dressings, will replace with calcium alginate today and will see if  we can get her done today 2/1; patient did not get her MRIs done last week because of issues with the MRI department receiving orders to do bilateral feet. She has 2 deep wounds in the setting of Charcot feet deformities bilaterally. Both of them at one point had exposed bone although I had trouble demonstrating that today. I am not sure that we can offload these very aggressively as I watched her leave the clinic last week her gait is very narrow and unsteady. 2/15; the MRI of her right foot did not show osteomyelitis potentially osteonecrosis of the talus. 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 The wounds are a large wound on the right medial foot and on the left a substantial wound on the left lateral plantar calcaneus. We have been using silver alginate. Ultimately I think we need to consider a total contact cast on the right while we work through the possibility of osteomyelitis in the left. I watched her walk out with her crutches I have some trepidation about the ability to walk with the cast on her right foot. Nevertheless with her Charcot deformities I do not think there is a way to heal these short of offloading them aggressively. 2/22; the culture of the bone in the left foot that I did last week showed a few Citrobacter Koseri as well as some streptococcal species. In my attempted bone for pathology did not actually yield a lot of bone the pathologist did not identify any osteomyelitis. 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 Is promised today and aggressive debridement of the right foot and a total contact cast which surprisingly she seemed to tolerate quite well 03/13/2019 upon evaluation today patient appears to be doing well with her total contact cast she is here for the first cast  change. She had no areas of rubbing or any other complications at this point. Overall things are doing quite well. 2/26; the patient was kindly seen by Dr. Orvan Falconer of infectious disease on 2/25. She is now on IV vancomycin PICC line placement in 2 days and oral Flagyl. This was in response to her MRI showing osteomyelitis of the left calcaneus concerning for osteomyelitis. We have been putting her right leg in a total contact cast. Our nurses report drainage. She is tolerating the total contact cast well. 3/4; the patient is started her IV antibiotics and is taking IV vancomycin and oral Flagyl. She appears to be tolerating these both well. This is for osteomyelitis involving the left calcaneus. The area on the right in the setting of bilateral Charcot deformities did not have any bone infection on MRI. We put a total contact cast on her with silver alginate as the primary dressing and she returns today in follow-up. Per our intake nurse there was too much drainage to leave this  on all week therefore we will bring her back for an early change Objective Constitutional Patient is hypertensive.. Pulse regular and within target range for patient.Marland Kitchen Respirations regular, non-labored and within target range.. Temperature is normal and within the target range for the patient.Marland Kitchen Appears in no distress. Vitals Time Taken: 11:22 AM, Height: 68 in, Weight: 265 lbs, BMI: 40.3, Temperature: 97.6 F, Pulse: 79 bpm, Respiratory Rate: 18 breaths/min, Blood Pressure: 140/65 mmHg. General Notes: Wound exam; the patient was brought in for a total contact cast change. This was done in the standard fashion per our intake nurse the wound looked quite good. Still too much drainage not to change this in mid week. Hopefully the antibiotics will help with this. Integumentary (Hair, Skin) Wound #9 status is Open. Original cause of wound was Blister. The wound is located on the Right,Medial Foot. The wound measures 3.9cm  length x 2.9cm width x 0.2cm depth; 8.883cm^2 area and 1.777cm^3 volume. There is Fat Layer (Subcutaneous Tissue) Exposed exposed. There is no tunneling or undermining noted. There is a large amount of serosanguineous drainage noted. The wound margin is thickened. There is large (67-100%) red, pink granulation within the wound bed. There is a small (1-33%) amount of necrotic tissue within the wound bed including Adherent Slough. Assessment Active Problems ICD-10 Type 2 diabetes mellitus with foot ulcer Non-pressure chronic ulcer of other part of left foot with necrosis of bone Non-pressure chronic ulcer of other part of right foot limited to breakdown of skin Non-pressure chronic ulcer of other part of right foot with necrosis of bone Type 2 diabetes mellitus with diabetic polyneuropathy Charcot's joint, right ankle and foot Charcot's joint, left ankle and foot Procedures Wound #9 Pre-procedure diagnosis of Wound #9 is a Diabetic Wound/Ulcer of the Lower Extremity located on the Right,Medial Foot . There was a Total Contact Cast Procedure by [name], MD. Post procedure Diagnosis Wound #9: Same as Pre-Procedure Plan 1. Change the total contact cast in standard fashion. 2. She will be back on Monday early next week for repeat change 3. She is started her IV vancomycin and Flagyl related to the osteomyelitis in the left plantar foot Electronic Signature(s) Signed: 03/21/2019 5:41:03 PM By: Baltazar Najjar MD Entered By: Baltazar Najjar on 03/21/2019 12:29:10 -------------------------------------------------------------------------------- Total Contact Cast Details Patient Name: Date of Service: SAHASRA, BELUE 03/21/2019 11:15 AM Medical Record [id number] Patient Account Number: [id number] Date of Birth/Sex: Treating RN: 1955-09-11 (64 y.o. F) Primary Care Provider: Chevis Pretty Other Clinician: Referring Provider: Chevis Pretty Treating  Provider/Extender:Dalton Molesworth, Lamar Sprinkles in Treatment: 7 Total Contact Cast Applied for Wound Assessment: Wound #9 Right,Medial Foot Performed By: Physician [name], MD Post Procedure Diagnosis Same as Pre-procedure Electronic Signature(s) Signed: 03/21/2019 5:41:03 PM By: Baltazar Najjar MD Entered By: Baltazar Najjar on 03/21/2019 12:29:58 -------------------------------------------------------------------------------- SuperBill Details Patient Name: Date of Service: [name] 03/21/2019 Medical Record [id number] Patient Account Number: [id number] Date of Birth/Sex: Treating RN: 1955/08/06 (63 y.o. F) Primary Care Provider: Chevis Pretty Other Clinician: Referring Provider: Treating Provider/Extender:Gatlin Kittell, [name], [facility] Weeks in Treatment: 7 Diagnosis Coding ICD-10 Codes Code Description E11.621 Type 2 diabetes mellitus with foot ulcer L97.524 Non-pressure chronic ulcer of other part of left foot with necrosis of bone L97.511 Non-pressure chronic ulcer of other part of right foot limited to breakdown of skin L97.514 Non-pressure chronic ulcer of other part of right foot with necrosis of bone E11.42 Type 2 diabetes mellitus with diabetic polyneuropathy M14.671 Charcot's joint, right ankle and foot M14.672  Charcot's joint, left ankle and foot Facility Procedures CPT4 Code Description: [id number] - APPLY TOTAL CONTACT LEG CAST ICD-10 Diagnosis Description L97.511 Non-pressure chronic ulcer of other part of right foot limit Modifier: ed to breakdo Quantity: 1 wn of skin Physician Procedures CPT4 Code Description: [id number] - WC PHYS APPLY TOTAL CONTACT CAST ICD-10 Diagnosis Description L97.511 Non-pressure chronic ulcer of other part of right foot limite Modifier: d to breakdo Quantity: 1 wn of skin Electronic Signature(s) Signed: 03/21/2019 5:41:03 PM By: Baltazar Najjar MD Entered By: Baltazar Najjar on 03/21/2019  12:31:51

## 2019-03-22 ENCOUNTER — Encounter (HOSPITAL_COMMUNITY): Payer: Self-pay | Admitting: Emergency Medicine

## 2019-03-22 ENCOUNTER — Emergency Department (HOSPITAL_COMMUNITY)
Admission: EM | Admit: 2019-03-22 | Discharge: 2019-03-22 | Disposition: A | Discharge status: Home / Self Care | Payer: Medicare Other | Attending: Emergency Medicine | Admitting: Emergency Medicine

## 2019-03-22 ENCOUNTER — Other Ambulatory Visit: Payer: Self-pay

## 2019-03-22 DIAGNOSIS — Z7982 Long term (current) use of aspirin: Secondary | ICD-10-CM | POA: Insufficient documentation

## 2019-03-22 DIAGNOSIS — Y712 Prosthetic and other implants, materials and accessory cardiovascular devices associated with adverse incidents: Secondary | ICD-10-CM | POA: Diagnosis not present

## 2019-03-22 DIAGNOSIS — I129 Hypertensive chronic kidney disease with stage 1 through stage 4 chronic kidney disease, or unspecified chronic kidney disease: Secondary | ICD-10-CM | POA: Diagnosis not present

## 2019-03-22 DIAGNOSIS — Z91040 Latex allergy status: Secondary | ICD-10-CM | POA: Insufficient documentation

## 2019-03-22 DIAGNOSIS — Z79899 Other long term (current) drug therapy: Secondary | ICD-10-CM | POA: Diagnosis not present

## 2019-03-22 DIAGNOSIS — Z87891 Personal history of nicotine dependence: Secondary | ICD-10-CM | POA: Diagnosis not present

## 2019-03-22 DIAGNOSIS — N183 Chronic kidney disease, stage 3 unspecified: Secondary | ICD-10-CM | POA: Diagnosis not present

## 2019-03-22 DIAGNOSIS — T82898A Other specified complication of vascular prosthetic devices, implants and grafts, initial encounter: Secondary | ICD-10-CM | POA: Insufficient documentation

## 2019-03-22 MED ORDER — ALTEPLASE 2 MG IJ SOLR
2.0000 mg | Freq: Once | INTRAMUSCULAR | Status: AC
  Administered 2019-03-22: 2 mg
  Filled 2019-03-22: qty 2

## 2019-03-22 MED ORDER — HEPARIN SOD (PORK) LOCK FLUSH 100 UNIT/ML IV SOLN
250.0000 [IU] | INTRAVENOUS | Status: AC | PRN
  Administered 2019-03-22: 18:00:00 250 [IU]
  Filled 2019-03-22: qty 3

## 2019-03-22 NOTE — ED Provider Notes (Signed)
[facility] EMERGENCY DEPARTMENT Provider Note   CSN: [id number] Arrival date & time: 03/22/19  1604     History No chief complaint on file.   [name] is a 64 y.o. female.  Pt reports picc line is clogged.  Pt reports she noticed a clot in the line and could not flush the line. Pt denies any other complaints.  Pt is being treated by Infectious disease for osteomyelitis  The history is provided by the patient. No language interpreter was used.       Past Medical History:  Diagnosis Date  . Chronic normocytic anemia   . CKD (chronic kidney disease) stage 3, GFR 30-59 ml/min 02/02/2013  . Diabetes mellitus type II, controlled ([place]) 06/17/2009  . Essential hypertension 06/17/2009  . GERD (gastroesophageal reflux disease)   . Glaucoma   . H/O hiatal hernia   . Hyperlipidemia   . MRSA (methicillin resistant Staphylococcus aureus)   . Obstructive sleep apnea 05/26/2010    Patient Active Problem List   Diagnosis Date Noted  . Osteomyelitis of left foot ([place]) 03/02/2019  . Puncture wound of foot 08/08/2018  . Right ankle swelling 02/08/2018  . Obesity 05/18/2017  . Hyperlipidemia 11/12/2014  . Charcot foot due to diabetes mellitus ([place]) 11/06/2014  . CKD (chronic kidney disease) stage 3, GFR 30-59 ml/min 02/02/2013  . GERD (gastroesophageal reflux disease) 03/09/2012  . Routine adult health maintenance 05/26/2010  . Obstructive sleep apnea 05/26/2010  . Normocytic anemia 01/12/2010  . Diabetes mellitus type II, controlled ([place]) 06/17/2009  . Essential hypertension 06/17/2009    Past Surgical History:  Procedure Laterality Date  . ACHILLES TENDON LENGTHENING Left 11/06/2014   Procedure: LEFT ACHILLES TENDON LENGTHENING, ;  Surgeon: [name] Simmer, MD;  Location: [facility];  Service: Orthopedics;  Laterality: Left;  . APPENDECTOMY  1975  . FOOT ARTHRODESIS Left 11/06/2014   Procedure: TALONAVICULAR CANCELLOUS CUBOID ARTHRODESIS WITH CORRECTIVE OSTEOTOMY ;   Surgeon: [name] Simmer, MD;  Location: [facility];  Service: Orthopedics;  Laterality: Left;  . HARDWARE REMOVAL Left 02/19/2015   Procedure: REMOVAL OF LEFT FOOT EXTERNAL FIXATOR, CASTING OF LEFT ANKLE ;  Surgeon: [name] Simmer, MD;  Location: [facility];  Service: Orthopedics;  Laterality: Left;  . I & D EXTREMITY Left 2011   "ulcer on my foot got infected" (01/30/2013)  . MULTIPLE TOOTH EXTRACTIONS Bilateral   . OOPHORECTOMY Bilateral 1975-1976     OB History   No obstetric history on file.     Family History  Problem Relation Age of Onset  . Diabetes Mother   . Hypertension Mother   . Dementia Mother   . Heart disease Father   . Stroke Father   . Diabetes Father   . Hypertension Father   . Gout Father   . Deep vein thrombosis Father   . Breast cancer Cousin   . Breast cancer Cousin     Social History   Tobacco Use  . Smoking status: Former Smoker    Packs/day: 0.50    Years: 0.00    Pack years: 0.00    Types: Cigarettes    Quit date: 12/22/1972    Years since quitting: 46.2  . Smokeless tobacco: Never Used  Substance Use Topics  . Alcohol use: No  . Drug use: No    Home Medications Prior to Admission medications   Medication Sig Start Date End Date Taking? Authorizing Provider  ACCU-CHEK SOFTCLIX LANCETS lancets Use to check blood glucose up  to three times a day. Dx code E 11.9 08/25/16   Scherrie Gerlach, MD  acetaminophen (TYLENOL) 500 MG tablet Take 500-1,000 mg by mouth every 6 (six) hours as needed for moderate pain or headache.    [provider]  aspirin EC 81 MG tablet Take 1 tablet (81 mg total) by mouth daily. 10/11/17   Reymundo Poll, MD  atorvastatin (LIPITOR) 40 MG tablet TAKE ONE TABLET BY MOUTH DAILY Patient taking differently: Take 40 mg by mouth daily.  07/17/18   Masoudi, Shawna Orleans, MD  Biotin 1000 MCG tablet Take 1,000 mcg by mouth daily.    [provider]  brimonidine-timolol (COMBIGAN) 0.2-0.5 % ophthalmic solution  Place 1 drop into both eyes every 12 (twelve) hours. Place one drop to both eyes twice daily for glaucoma Patient taking differently: Place 1 drop into both eyes every 12 (twelve) hours.  08/25/16   Scherrie Gerlach, MD  calcium-vitamin D (OSCAL-500) 500-400 MG-UNIT tablet Take 1 tablet by mouth 2 (two) times daily.     [provider]  Cholecalciferol (VITAMIN D3) 25 MCG (1000 UT) CAPS Take 1 capsule by mouth daily.    [provider]  cycloSPORINE (RESTASIS) 0.05 % ophthalmic emulsion Place 1 drop into both eyes 2 (two) times daily.    [provider]  Emollient (BAG BALM EX) Apply 1 application topically daily.    [provider]  ferrous sulfate 325 (65 FE) MG tablet Take 1 tablet (325 mg total) by mouth daily with breakfast. 10/11/17   Reymundo Poll, MD  glucose blood (ACCU-CHEK AVIVA) test strip Use to check blood glucose up to three times a day. Dx code E 11.9 08/25/16   Scherrie Gerlach, MD  [name] TAKE ONE TABLET BY MOUTH TWICE A DAY Patient taking differently: Take 1 tablet by mouth in the morning and at bedtime.  03/05/19   Masoudi, Elhamalsadat, MD  lisinopril (ZESTRIL) 10 MG tablet Take 1 tablet (10 mg total) by mouth daily. 12/03/18   Masoudi, Shawna Orleans, MD  metroNIDAZOLE (FLAGYL) 500 MG tablet Take 1 tablet (500 mg total) by mouth 3 (three) times daily. 03/14/19   Cliffton Asters, MD  Multiple Vitamin (MULTIVITAMIN) tablet Take 1 tablet by mouth daily.     [provider]  Netarsudil-Latanoprost (ROCKLATAN) 0.02-0.005 % SOLN Apply 1 drop to eye daily at 10 pm. 09/26/18   [provider]  pantoprazole (PROTONIX) 20 MG tablet 1 tablet twice a day Patient taking differently: Take 20 mg by mouth 2 (two) times daily.  12/03/18   Masoudi, Shawna Orleans, MD  Polyvinyl Alcohol-Povidone (REFRESH OP) Place 1 drop into both eyes daily as needed (dry eyes).    [provider]  vitamin B-12 (CYANOCOBALAMIN) 1000 MCG tablet  Take 1,000 mcg by mouth daily.    [provider]  vitamin C (ASCORBIC ACID) 500 MG tablet Take 500 mg by mouth daily.    [provider]  Zinc 50 MG CAPS Take 50 mg by mouth daily.     [provider]  pravastatin (PRAVACHOL) 20 MG tablet Take 1 tablet (20 mg total) by mouth daily. 12/23/11 02/11/12  Leodis Sias, MD    Allergies    Codeine, Pravastatin, and Latex  Review of Systems   Review of Systems  All other systems reviewed and are negative.   Physical Exam Updated Vital Signs BP 134/72   Pulse 96   Temp 98.2 F (36.8 C) (Oral)   Resp 18   SpO2 100%  Physical Exam Vitals and nursing note reviewed.  Constitutional:      Appearance: She is well-developed.  HENT:     Head: Normocephalic.  Cardiovascular:     Rate and Rhythm: Normal rate.  Pulmonary:     Effort: Pulmonary effort is normal.  Abdominal:     General: There is no distension.  Musculoskeletal:        General: Normal range of motion.  Neurological:     Mental Status: She is alert and oriented to person, place, and time.     ED Results / Procedures / Treatments   Labs (all labs ordered are listed, but only abnormal results are displayed) Labs Reviewed - No data to display  EKG None  Radiology No results found.  Procedures Procedures (including critical care time)  Medications Ordered in ED Medications  alteplase (CATHFLO ACTIVASE) injection 2 mg (2 mg Intracatheter Given 03/22/19 1653)    ED Course  I have reviewed the triage vital signs and the nursing notes.  Pertinent labs & imaging results that were available during my care of the patient were reviewed by me and considered in my medical decision making (see chart for details).    MDM Rules/Calculators/A&P                      IV team in to evaluate and TPA.  PICC line opened  Final Clinical Impression(s) / ED Diagnoses Final diagnoses:  Occluded PICC line, initial encounter [facility])    Rx / DC  Orders ED Discharge Orders    None       Elson Areas, [place] 03/22/19 1714    Derwood Kaplan, MD 03/24/19 0030

## 2019-03-22 NOTE — ED Triage Notes (Signed)
Pt here from home with c/o picc line not flushing,

## 2019-03-22 NOTE — Progress Notes (Signed)
Patient stated that she flushed the picc line, gave herself the antibiotic, and then when she went to flush the PICC line after the antibiotic, she noticed blood coming up into the tubing. She didn't know what to do and clamped the PICC line. She tried calling her [facility] nurse and infectious disease dr but was not able to get a hold of anyone. She came to the ER.  Unable to flush or draw back blood. TPA instilled into PICC line. Educated patient that sometimes when you disconnect the tubing or flush, blood can back up into the line. If it gets into the flush, get a new one and flush the blood through the line, clamp the PICC line, and then remove the flush and/or heparin flush. She verbalizes understanding.   Will come back to remove TPA after it dwells. RN and PA aware.

## 2019-03-22 NOTE — Discharge Instructions (Signed)
Return if any problems.

## 2019-03-25 ENCOUNTER — Other Ambulatory Visit: Payer: Self-pay

## 2019-03-25 ENCOUNTER — Encounter (HOSPITAL_BASED_OUTPATIENT_CLINIC_OR_DEPARTMENT_OTHER): Payer: Medicare Other | Admitting: Internal Medicine

## 2019-03-25 DIAGNOSIS — E11621 Type 2 diabetes mellitus with foot ulcer: Secondary | ICD-10-CM | POA: Diagnosis not present

## 2019-03-25 NOTE — Progress Notes (Signed)
[name], [name] ([id number]) Visit Report for 03/25/2019 HPI Details Patient Name: Date of Service: [name], [name] 03/25/2019 3:00 PM Medical Record [id number] Patient Account Number: [id number] Date of Birth/Sex: Treating RN: [birth year] (64 y.o. F) Primary Care Provider: Dewayne Hatch Other Clinician: Referring Provider: Treating Provider/Extender:Shaquasha Gerstel, [name], [facility] Weeks in Treatment: 8 History of Present Illness HPI Description: 64 yrs old bf here for follow up recurrent left charcot foot ulcer. She has DM. She is not a smoker. She states a blister developed 3 months ago at site of previous breakdown from 1 year ago. It was debrided at the podiatrist's office. She went to the ER and then sent here. She denies pain. Xray was negative for osteo. Culture from this clinic negative. 09/08/[name] to [name] for Charcot foot. Seen by him and MRI scheduled 09/19/14. Prior silver collagen but this was not ordered last visit, patient has been rinsing with saline alone. Re institute silver collagen every other day. F/u 2 weeks with Dr. Dellia Nims as Labor Day holiday. Supplies ordered. 09/25/14; this is a patient with a Charcot foot on the left. she has a wound over the plantar aspect of her left calcaneus. She has been seen by [name] of orthopedic surgery and has had an MRI. She is apparently going within the next week or 2 for corrective surgery which will involve an external fixator. I don't have any of the details of this. 10/06/14; The patient is a 64 yrs old bf with a left Charcot foot and ulcer on the plantar. 12/01/14 Returns post surgery with [name]. Has follow up visit later this week with him, currently in facility and NWB over this extremity. States no drainage from foot and no current wound care. On exam callus and scab in place, suspect healed. Has external fixator in place. Will defer to [name] for management, ok to place  moisturizing cream over scabs and callus and she will call if she requires follow up here. READMISSION 01/28/2019 Patient was in the clinic in 2016 for a wound on the plantar aspect of her left calcaneus. Predominantly cared for by Dr. [name] she was referred to [name] and had corrective surgery for the position of her left ankle I believe. She had previously been here in 2015 and then prior to that in 2011 2012 that I do not have these records. More recently she has developed fairly extensive wounds on the right medial foot, left lateral foot and a small area on the right medial great toe. Right medial foot wound has been there for 6 to 7 months, left foot wound for 2 to 3 months and the right great toe only over the last few weeks. She has been followed by [name] at Dr. [name] office it sounds as though she has been undergoing callus paring and silver nitrate. The patient has been washing these off with soap and water and plan applying dry dressing Past medical history, type 2 diabetes well controlled with a recent hemoglobin A1c of 7 on 11/16, type 2 diabetes with peripheral neuropathy, previous left Charcot joint surgery bilateral Charcot joints currently, stage III chronic renal failure, hypertension, obstructive sleep apnea, history of MRSA and gastroesophageal reflux. 1/18; this is a patient with bilateral Charcot feet. Plain x-rays that I did of both of these wounds that are either at bone or precariously close to bone were done. On the right foot this showed possible lytic destruction involving the anterior aspect of the  talus which may represent a wound osteomyelitis. An MRI was recommended. On the left there was no definite lytic destruction although the wound is deep undermining's and I think probably requires an MRI on this basis in any case 1/25; patient was supposed to have her MRI last Friday of her bilateral feet however they would not do it because there was  silver alginate in her dressings, will replace with calcium alginate today and will see if we can get her done today 2/1; patient did not get her MRIs done last week because of issues with the MRI department receiving orders to do bilateral feet. She has 2 deep wounds in the setting of Charcot feet deformities bilaterally. Both of them at one point had exposed bone although I had trouble demonstrating that today. I am not sure that we can offload these very aggressively as I watched her leave the clinic last week her gait is very narrow and unsteady. 2/15; the MRI of her right foot did not show osteomyelitis potentially osteonecrosis of the talus. 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 The wounds are a large wound on the right medial foot and on the left a substantial wound on the left lateral plantar calcaneus. We have been using silver alginate. Ultimately I think we need to consider a total contact cast on the right while we work through the possibility of osteomyelitis in the left. I watched her walk out with her crutches I have some trepidation about the ability to walk with the cast on her right foot. Nevertheless with her Charcot deformities I do not think there is a way to heal these short of offloading them aggressively. 2/22; the culture of the bone in the left foot that I did last week showed a few Citrobacter Koseri as well as some streptococcal species. In my attempted bone for pathology did not actually yield a lot of bone the pathologist did not identify any osteomyelitis. 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 Is promised today and aggressive debridement of the right foot and a total contact cast which surprisingly she seemed to tolerate quite well 03/13/2019 upon evaluation today  patient appears to be doing well with her total contact cast she is here for the first cast change. She had no areas of rubbing or any other complications at this point. Overall things are doing quite well. 2/26; the patient was kindly seen by Dr. Orvan Falconer of infectious disease on 2/25. She is now on IV vancomycin PICC line placement in 2 days and oral Flagyl. This was in response to her MRI showing osteomyelitis of the left calcaneus concerning for osteomyelitis. We have been putting her right leg in a total contact cast. Our nurses report drainage. She is tolerating the total contact cast well. 3/4; the patient is started her IV antibiotics and is taking IV vancomycin and oral Flagyl. She appears to be tolerating these both well. This is for osteomyelitis involving the left calcaneus. The area on the right in the setting of bilateral Charcot deformities did not have any bone infection on MRI. We put a total contact cast on her with silver alginate as the primary dressing and she returns today in follow-up. Per our intake nurse there was too much drainage to leave this on all week therefore we will  bring her back for an early change 3/8; follow-up total contact cast she is still having a lot of drainage probably too much drainage from the right foot wound will week with the same cast. She will be back on Thursday. The area on the left looks somewhat better Electronic Signature(s) Signed: 03/25/2019 5:29:15 PM By: Baltazar Najjar MD Entered By: Baltazar Najjar on 03/25/2019 16:34:27 -------------------------------------------------------------------------------- Physical Exam Details Patient Name: Date of Service: [name] 03/25/2019 3:00 PM Medical Record [id number] Patient Account Number: [id number] Date of Birth/Sex: Treating RN: [birth year] (63 y.o. F) Primary Care Provider: Chevis Pretty Other Clinician: Referring Provider: Treating Provider/Extender:Ronon Ferger,  [name], [facility] Weeks in Treatment: 8 Constitutional Sitting or standing Blood Pressure is within target range for patient.. Pulse regular and within target range for patient.Marland Kitchen Respirations regular, non-labored and within target range.. Temperature is normal and within the target range for the patient.Marland Kitchen Appears in no distress. Notes Wound exam; the patient was brought in for a total contact cast change. The wound bed on the right does not look like a draining wound there is no undermining. The edges are somewhat rolled and thick this may need an extensive debridement On the left actually I think there is smaller-looking area. There is still depth but not much undermining. No evidence of infection in either wound area Electronic Signature(s) Signed: 03/25/2019 5:29:15 PM By: Baltazar Najjar MD Entered By: Baltazar Najjar on 03/25/2019 16:35:30 -------------------------------------------------------------------------------- Physician Orders Details Patient Name: Date of Service: [name] 03/25/2019 3:00 PM Medical Record [id number] Patient Account Number: [id number] Date of Birth/Sex: Treating RN: January 28, 1955 (63 y.o. [name] Primary Care Provider: Chevis Pretty Other Clinician: Referring Provider: Treating Provider/Extender:Suleika Donavan, [name], [facility] Weeks in Treatment: 8 Verbal / Phone Orders: No Diagnosis Coding ICD-10 Coding Code Description E11.621 Type 2 diabetes mellitus with foot ulcer L97.524 Non-pressure chronic ulcer of other part of left foot with necrosis of bone L97.511 Non-pressure chronic ulcer of other part of right foot limited to breakdown of skin L97.514 Non-pressure chronic ulcer of other part of right foot with necrosis of bone E11.42 Type 2 diabetes mellitus with diabetic polyneuropathy M14.671 Charcot's joint, right ankle and foot M14.672 Charcot's joint, left ankle and foot Follow-up  Appointments Return Appointment in 1 week. - Monday Return Appointment in: - Thursday for cast change Dressing Change Frequency Wound #10 Left,Lateral,Plantar Foot Change Dressing every other day. Wound #9 Right,Medial Foot Do not change entire dressing for one week. Wound Cleansing May shower with protection. Primary Wound Dressing Wound #10 Left,Lateral,Plantar Foot Calcium Alginate with Silver - lightly pack Wound #9 Right,Medial Foot Calcium Alginate with Silver Secondary Dressing Wound #10 Left,Lateral,Plantar Foot Kerlix/Rolled Gauze Dry Gauze Other: - foam donut Wound #9 Right,Medial Foot Kerlix/Rolled Gauze Zetuvit or Kerramax Carboflex Off-Loading Total Contact Cast to Right Lower Extremity Home Health Continue Home Health skilled nursing for wound care. - Advanced/Adapt Electronic Signature(s) Signed: 03/25/2019 5:29:15 PM By: Baltazar Najjar MD Signed: 03/25/2019 5:59:01 PM By: Zandra Abts RN, BSN Entered By: Zandra Abts on 03/25/2019 16:22:08 -------------------------------------------------------------------------------- Problem List Details Patient Name: Date of Service: [facility] 03/25/2019 3:00 PM Medical Record [id number] Patient Account Number: [id number] Date of Birth/Sex: Treating RN: 1955-02-08 (63 y.o. [name] Primary Care Provider: Chevis Pretty Other Clinician: Referring Provider: Treating Provider/Extender:Waseem Suess, [name], [facility] Weeks in Treatment: 8 Active Problems ICD-10 Evaluated Encounter Code Description Active Date Today Diagnosis E11.621 Type 2 diabetes mellitus with foot ulcer 01/28/2019 No Yes L97.524 Non-pressure chronic ulcer of other part  of left foot 01/28/2019 No Yes with necrosis of bone L97.511 Non-pressure chronic ulcer of other part of right foot 01/28/2019 No Yes limited to breakdown of skin L97.514 Non-pressure chronic ulcer of other part of right foot 01/28/2019 No  Yes with necrosis of bone E11.42 Type 2 diabetes mellitus with diabetic polyneuropathy 01/28/2019 No Yes M14.671 Charcot's joint, right ankle and foot 01/28/2019 No Yes M14.672 Charcot's joint, left ankle and foot 01/28/2019 No Yes Inactive Problems Resolved Problems Electronic Signature(s) Signed: 03/25/2019 5:29:15 PM By: Baltazar Najjar MD Entered By: Baltazar Najjar on 03/25/2019 16:33:31 -------------------------------------------------------------------------------- Progress Note Details Patient Name: Date of Service: [name] 03/25/2019 3:00 PM Medical Record [id number] Patient Account Number: [id number] Date of Birth/Sex: Treating RN: [birth year] (64 y.o. F) Primary Care Provider: Chevis Pretty Other Clinician: Referring Provider: Treating Provider/Extender:Taja Pentland, [name], [facility] Weeks in Treatment: 8 Subjective History of Present Illness (HPI) 64 yrs old bf here for follow up recurrent left charcot foot ulcer. She has DM. She is not a smoker. She states a blister developed 3 months ago at site of previous breakdown from 1 year ago. It was debrided at the podiatrist's office. She went to the ER and then sent here. She denies pain. Xray was negative for osteo. Culture from this clinic negative. 09/08/14 Referred by [name] to [name] for Charcot foot. Seen by him and MRI scheduled 09/19/14. Prior silver collagen but this was not ordered last visit, patient has been rinsing with saline alone. Re institute silver collagen every other day. F/u 2 weeks with [name] as Labor Day holiday. Supplies ordered. 09/25/14; this is a patient with a Charcot foot on the left. she has a wound over the plantar aspect of her left calcaneus. She has been seen by [name] of orthopedic surgery and has had an MRI. She is apparently going within the next week or 2 for corrective surgery which will involve an external fixator. I don't have any of the details  of this. 10/06/14; The patient is a 64 yrs old bf with a left Charcot foot and ulcer on the plantar. 12/01/14 Returns post surgery with [name]. Has follow up visit later this week with him, currently in facility and NWB over this extremity. States no drainage from foot and no current wound care. On exam callus and scab in place, suspect healed. Has external fixator in place. Will defer to [name] for management, ok to place moisturizing cream over scabs and callus and she will call if she requires follow up here. READMISSION 01/28/2019 Patient was in the clinic in 2016 for a wound on the plantar aspect of her left calcaneus. Predominantly cared for by [name] she was referred to [name] and had corrective surgery for the position of her left ankle I believe. She had previously been here in 2015 and then prior to that in 2011 2012 that I do not have these records. More recently she has developed fairly extensive wounds on the right medial foot, left lateral foot and a small area on the right medial great toe. Right medial foot wound has been there for 6 to 7 months, left foot wound for 2 to 3 months and the right great toe only over the last few weeks. She has been followed by [name] at Dr. Laverta [place] office it sounds as though she has been undergoing callus paring and silver nitrate. The patient has been washing these off with soap and water and plan applying dry dressing Past  medical history, type 2 diabetes well controlled with a recent hemoglobin A1c of 7 on 11/16, type 2 diabetes with peripheral neuropathy, previous left Charcot joint surgery bilateral Charcot joints currently, stage III chronic renal failure, hypertension, obstructive sleep apnea, history of MRSA and gastroesophageal reflux. 1/18; this is a patient with bilateral Charcot feet. Plain x-rays that I did of both of these wounds that are either at bone or precariously close to bone were done. On the right foot  this showed possible lytic destruction involving the anterior aspect of the talus which may represent a wound osteomyelitis. An MRI was recommended. On the left there was no definite lytic destruction although the wound is deep undermining's and I think probably requires an MRI on this basis in any case 1/25; patient was supposed to have her MRI last Friday of her bilateral feet however they would not do it because there was silver alginate in her dressings, will replace with calcium alginate today and will see if we can get her done today 2/1; patient did not get her MRIs done last week because of issues with the MRI department receiving orders to do bilateral feet. She has 2 deep wounds in the setting of Charcot feet deformities bilaterally. Both of them at one point had exposed bone although I had trouble demonstrating that today. I am not sure that we can offload these very aggressively as I watched her leave the clinic last week her gait is very narrow and unsteady. 2/15; the MRI of her right foot did not show osteomyelitis potentially osteonecrosis of the talus. 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 The wounds are a large wound on the right medial foot and on the left a substantial wound on the left lateral plantar calcaneus. We have been using silver alginate. Ultimately I think we need to consider a total contact cast on the right while we work through the possibility of osteomyelitis in the left. I watched her walk out with her crutches I have some trepidation about the ability to walk with the cast on her right foot. Nevertheless with her Charcot deformities I do not think there is a way to heal these short of offloading them aggressively. 2/22; the culture of the bone in the left foot that I did last week showed a few Citrobacter Koseri as well as some streptococcal species. In my attempted bone for pathology did not actually yield a  lot of bone the pathologist did not identify any osteomyelitis. 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 Is promised today and aggressive debridement of the right foot and a total contact cast which surprisingly she seemed to tolerate quite well 03/13/2019 upon evaluation today patient appears to be doing well with her total contact cast she is here for the first cast change. She had no areas of rubbing or any other complications at this point. Overall things are doing quite well. 2/26; the patient was kindly seen by Dr. Orvan Falconer of infectious disease on 2/25. She is now on IV vancomycin PICC line placement in 2 days and oral Flagyl. This was in response to her MRI showing osteomyelitis of the left calcaneus concerning for osteomyelitis. We have been putting her right leg in a total contact cast. Our nurses report drainage. She is tolerating the total contact cast  well. 3/4; the patient is started her IV antibiotics and is taking IV vancomycin and oral Flagyl. She appears to be tolerating these both well. This is for osteomyelitis involving the left calcaneus. The area on the right in the setting of bilateral Charcot deformities did not have any bone infection on MRI. We put a total contact cast on her with silver alginate as the primary dressing and she returns today in follow-up. Per our intake nurse there was too much drainage to leave this on all week therefore we will bring her back for an early change 3/8; follow-up total contact cast she is still having a lot of drainage probably too much drainage from the right foot wound will week with the same cast. She will be back on Thursday. The area on the left looks somewhat better Objective Constitutional Sitting or standing Blood Pressure is within target range for patient.. Pulse regular and  within target range for patient.Marland Kitchen Respirations regular, non-labored and within target range.. Temperature is normal and within the target range for the patient.Marland Kitchen Appears in no distress. Vitals Time Taken: 3:20 PM, Height: 68 in, Weight: 265 lbs, BMI: 40.3, Temperature: 98.2 F, Pulse: 75 bpm, Respiratory Rate: 18 breaths/min, Blood Pressure: 137/55 mmHg. General Notes: Wound exam; the patient was brought in for a total contact cast change. The wound bed on the right does not look like a draining wound there is no undermining. The edges are somewhat rolled and thick this may need an extensive debridement ooOn the left actually I think there is smaller-looking area. There is still depth but not much undermining. ooNo evidence of infection in either wound area Integumentary (Hair, Skin) Wound #10 status is Open. Original cause of wound was Blister. The wound is located on the Left,Lateral,Plantar Foot. The wound measures 1.4cm length x 1.5cm width x 1.9cm depth; 1.649cm^2 area and 3.134cm^3 volume. There is bone, muscle, and Fat Layer (Subcutaneous Tissue) Exposed exposed. There is no tunneling or undermining noted. There is a medium amount of serosanguineous drainage noted. The wound margin is thickened. There is large (67-100%) red granulation within the wound bed. There is a small (1-33%) amount of necrotic tissue within the wound bed including Adherent Slough. Wound #9 status is Open. Original cause of wound was Blister. The wound is located on the Right,Medial Foot. The wound measures 4cm length x 3cm width x 0.2cm depth; 9.425cm^2 area and 1.885cm^3 volume. There is Fat Layer (Subcutaneous Tissue) Exposed exposed. There is no tunneling or undermining noted. There is a large amount of serosanguineous drainage noted. The wound margin is thickened. There is large (67-100%) red, pink granulation within the wound bed. There is a small (1-33%) amount of necrotic tissue within the wound bed  including Adherent Slough. Assessment Active Problems ICD-10 Type 2 diabetes mellitus with foot ulcer Non-pressure chronic ulcer of other part of left foot with necrosis of bone Non-pressure chronic ulcer of other part of right foot limited to breakdown of skin Non-pressure chronic ulcer of other part of right foot with necrosis of bone Type 2 diabetes mellitus with diabetic polyneuropathy Charcot's joint, right ankle and foot Charcot's joint, left ankle and foot Procedures Wound #9 Pre-procedure diagnosis of Wound #9 is a Diabetic Wound/Ulcer of the Lower Extremity located on the Right,Medial Foot . There was a Total Contact Cast Procedure by [name], MD. Post procedure Diagnosis Wound #9: Same as Pre-Procedure Plan Follow-up Appointments: Return Appointment in 1 week. - Monday Return Appointment in: - Thursday for cast  change Dressing Change Frequency: Wound #10 Left,Lateral,Plantar Foot: Change Dressing every other day. Wound #9 Right,Medial Foot: Do not change entire dressing for one week. Wound Cleansing: May shower with protection. Primary Wound Dressing: Wound #10 Left,Lateral,Plantar Foot: Calcium Alginate with Silver - lightly pack Wound #9 Right,Medial Foot: Calcium Alginate with Silver Secondary Dressing: Wound #10 Left,Lateral,Plantar Foot: Kerlix/Rolled Gauze Dry Gauze Other: - foam donut Wound #9 Right,Medial Foot: Kerlix/Rolled Gauze Zetuvit or Kerramax Carboflex Off-Loading: Total Contact Cast to Right Lower Extremity Home Health: Continue Home Health skilled nursing for wound care. - Advanced/Adapt 1. We reapplied the total contact cast of the right medial foot. There is certainly not any chance of keeping a cast on this all week at this point 2. Wounds may need debridement next time I see her especially the edges on the right Electronic Signature(s) Signed: 03/25/2019 5:29:15 PM By: Baltazar Najjar MD Entered By: Baltazar Najjar on  03/25/2019 16:36:26 -------------------------------------------------------------------------------- Total Contact Cast Details Patient Name: Date of Service: [name] 03/25/2019 3:00 PM Medical Record [id number] Patient Account Number: [id number] Date of Birth/Sex: Treating RN: December 13, 1955 (63 y.o. F) Primary Care Provider: Chevis Pretty Other Clinician: Referring Provider: Chevis Pretty Treating Provider/Extender:[name] Snooks, Lamar Sprinkles in Treatment: 8 Total Contact Cast Applied for Wound Assessment: Wound #9 Right,Medial Foot Performed By: Physician [name], MD Post Procedure Diagnosis Same as Pre-procedure Electronic Signature(s) Signed: 03/25/2019 5:29:15 PM By: Baltazar Najjar MD Entered By: Baltazar Najjar on 03/25/2019 16:33:50 -------------------------------------------------------------------------------- SuperBill Details Patient Name: Date of Service: [name] 03/25/2019 Medical Record [id number] Patient Account Number: [id number] Date of Birth/Sex: Treating RN: 03/02/1955 (63 y.o. 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 M14.671 Charcot's joint, right ankle and foot M14.672 Charcot's joint, left ankle and foot Facility Procedures CPT4 Code Description: [id number] - APPLY TOTAL CONTACT LEG CAST ICD-10 Diagnosis Description L97.511 Non-pressure chronic ulcer of other part  of right foot limit Modifier: ed to breakdo Quantity: 1 wn of skin Physician Procedures CPT4 Code Description: [id number] - WC PHYS APPLY TOTAL CONTACT CAST ICD-10 Diagnosis Description L97.511 Non-pressure chronic ulcer of other part of right foot limi Modifier: ted to breakdo Quantity: 1 wn of skin Electronic Signature(s) Signed: 03/25/2019 5:29:15 PM By: Baltazar Najjar MD Entered By: Baltazar Najjar on 03/25/2019 16:37:10

## 2019-03-27 ENCOUNTER — Inpatient Hospital Stay (HOSPITAL_COMMUNITY): Admission: RE | Admit: 2019-03-27 | Payer: Medicare Other | Source: Ambulatory Visit

## 2019-03-27 ENCOUNTER — Ambulatory Visit: Payer: Medicare Other | Admitting: Internal Medicine

## 2019-03-28 ENCOUNTER — Encounter (HOSPITAL_BASED_OUTPATIENT_CLINIC_OR_DEPARTMENT_OTHER): Payer: Medicare Other | Admitting: Internal Medicine

## 2019-03-28 ENCOUNTER — Other Ambulatory Visit: Payer: Self-pay

## 2019-03-28 DIAGNOSIS — E11621 Type 2 diabetes mellitus with foot ulcer: Secondary | ICD-10-CM | POA: Diagnosis not present

## 2019-03-30 NOTE — Progress Notes (Signed)
[name], IGLESIAS ([id number]) Visit Report for 03/28/2019 Arrival Information Details Patient Name: Date of Service: [name], Sarah 03/28/2019 11:30 AM Medical Record [id number] Patient Account Number: [id number] Date of Birth/Sex: Treating RN: 03/28/1955 (64 y.o. F) Dwiggins, Shannon Primary Care Kohan Azizi: Dewayne Hatch Other Clinician: Referring Brandan Robicheaux: Treating Greysin Medlen/Extender:Robson, [name], [facility] Weeks in Treatment: 8 Visit Information History Since Last Visit Added or deleted any medications: No Patient Arrived: Crutches Any new allergies or adverse reactions: No Arrival Time: 11:46 Had a fall or experienced change in No Accompanied By: self activities of daily living that may affect Transfer Assistance: None risk of falls: Patient Identification Verified: Yes Signs or symptoms of abuse/neglect since No Secondary Verification Process Completed: Yes last visito Patient Requires Transmission-Based No Hospitalized since last visit: No Precautions: Implantable device outside of the clinic No Patient Has Alerts: No excluding cellular tissue based products placed in the center since last visit: Has Dressing in Place as Prescribed: Yes Has Footwear/Offloading in Place as Yes Prescribed: Right: Total Contact Cast Pain Present Now: No Electronic Signature(s) Signed: 03/28/2019 6:03:12 PM By: Kela Millin Entered By: Kela Millin on 03/28/2019 11:46:36 -------------------------------------------------------------------------------- Encounter Discharge Information Details Patient Name: Date of Service: [name]. 03/28/2019 11:30 AM Medical Record [id number] Patient Account Number: [id number] Date of Birth/Sex: Treating RN: [birth year] (64 y.o. [name] Primary Care Maniyah Moller: Dewayne Hatch Other Clinician: Referring Avir Deruiter: Treating Melanie Pellot/Extender:Robson, [name],  [facility] Weeks in Treatment: 8 Encounter Discharge Information Items Discharge Condition: Stable Ambulatory Status: Crutches Discharge Destination: Home Transportation: Private Auto Accompanied By: self Schedule Follow-up Appointment: Yes Clinical Summary of Care: Electronic Signature(s) Signed: 03/28/2019 6:00:33 PM By: Deon Pilling Entered By: Deon Pilling on 03/28/2019 17:36:37 -------------------------------------------------------------------------------- Lower Extremity Assessment Details Patient Name: Date of Service: ARENA, LINDAHL 03/28/2019 11:30 AM Medical Record [id number] Patient Account Number: [id number] Date of Birth/Sex: Treating RN: February 14, 1955 (63 y.o. Clearnce Sorrel Primary Care Tondalaya Perren: Dewayne Hatch Other Clinician: Referring Ronasia Isola: Treating Heiress Williamson/Extender:Robson, [name], [facility] Weeks in Treatment: 8 Edema Assessment Assessed: [Left: No] [Right: No] Edema: [Left: Yes] [Right: Yes] Calf Left: Right: Point of Measurement: 43 cm From Medial Instep 39 cm 42 cm Ankle Left: Right: Point of Measurement: 12 cm From Medial Instep 27 cm 28 cm Vascular Assessment Pulses: Dorsalis Pedis Palpable: [Left:Yes] [Right:Yes] Electronic Signature(s) Signed: 03/28/2019 6:03:12 PM By: Kela Millin Entered By: Kela Millin on 03/28/2019 11:48:07 -------------------------------------------------------------------------------- Multi Wound Chart Details Patient Name: Date of Service: [name]. 03/28/2019 11:30 AM Medical Record [id number] Patient Account Number: [id number] Date of Birth/Sex: Treating RN: [birth year] (64 y.o. [name] Primary Care Yakub Lodes: Dewayne Hatch Other Clinician: Referring Mitul Hallowell: Treating Derwood Becraft/Extender:Robson, [name], [facility] Weeks in Treatment: 8 Vital Signs Height(in): 68 Pulse(bpm): 71 Weight(lbs): 265 Blood Pressure(mmHg):  133/57 Body Mass Index(BMI): 40 Temperature(F): 98.3 Respiratory 18 Rate(breaths/min): Photos: [10:No Photos] [9:No Photos] [N/A:N/A] Wound Location: [10:Left Foot - Plantar, Lateral Right Foot - Medial] [N/A:N/A] Wounding Event: [10:Blister] [9:Blister] [N/A:N/A] Primary Etiology: [10:Diabetic Wound/Ulcer of the Diabetic Wound/Ulcer of the N/A Lower Extremity] [9:Lower Extremity] Comorbid History: [10:Glaucoma, Chronic sinus Glaucoma, Chronic sinus N/A problems/congestion, Anemia, Sleep Apnea, Hypertension, Type II Diabetes, Rheumatoid Arthritis, Neuropathy] [9:problems/congestion, Anemia, Sleep Apnea, Hypertension, Type II  Diabetes, Rheumatoid Arthritis, Neuropathy] Date Acquired: [10:11/18/2018] [9:06/18/2018] [N/A:N/A] Weeks of Treatment: [10:8] [9:8] [N/A:N/A] Wound Status: [10:Open] [9:Open] [N/A:N/A] Measurements L x W x D 1.4x1.5x1.9 [9:4x3x0.1] [N/A:N/A] (cm) Area (cm) : [10:1.649] [9:9.425] [N/A:N/A] Volume (cm) : [10:3.134] [9:0.942] [N/A:N/A] % Reduction in Area: [10:6.30%] [9:14.30%] [N/A:N/A] % Reduction  in Volume: -18.80% [9:90.50%] [N/A:N/A] Classification: [10:Grade 2] [9:Grade 2] [N/A:N/A] Exudate Amount: [10:Medium] [9:Large] [N/A:N/A] Exudate Type: [10:Serosanguineous] [9:Serosanguineous] [N/A:N/A] Exudate Color: [10:red, brown] [9:red, brown] [N/A:N/A] Wound Margin: [10:Thickened] [9:Thickened] [N/A:N/A] Granulation Amount: [10:Large (67-100%)] [9:Large (67-100%)] [N/A:N/A] Granulation Quality: [10:Red] [9:Red, Pink] [N/A:N/A] Necrotic Amount: [10:Small (1-33%)] [9:Small (1-33%)] [N/A:N/A] Exposed Structures: [10:Fat Layer (Subcutaneous Fat Layer (Subcutaneous N/A Tissue) Exposed: Yes Muscle: Yes Bone: Yes Fascia: No Tendon: No Joint: No] [9:Tissue) Exposed: Yes Fascia: No Tendon: No Muscle: No Joint: No Bone: No] Epithelialization: [10:None N/A] [9:Small (1-33%) Total Contact Cast] [N/A:N/A] Treatment Notes Electronic Signature(s) Signed: 03/28/2019 6:00:33 PM  By: Shawn Stall Signed: 03/30/2019 7:03:15 AM By: Baltazar Najjar MD Entered By: Baltazar Najjar on 03/28/2019 12:23:06 -------------------------------------------------------------------------------- Multi-Disciplinary Care Plan Details Patient Name: Date of Service: [name]. 03/28/2019 11:30 AM Medical Record [id number] Patient Account Number: [id number] Date of Birth/Sex: Treating RN: 1955-09-13 (64 y.o. [name] Primary Care Rafi Kenneth: Chevis Pretty Other Clinician: Referring Hubbard Seldon: Treating Pesach Frisch/Extender:Robson, [name], [facility] Weeks in Treatment: 8 Active Inactive Wound/Skin Impairment Nursing Diagnoses: Impaired tissue integrity Knowledge deficit related to ulceration/compromised skin integrity Goals: Patient/caregiver will verbalize understanding of skin care regimen Date Initiated: 01/28/2019 Target Resolution Date: 05/16/2019 Goal Status: Active Interventions: Assess patient/caregiver ability to obtain necessary supplies Assess patient/caregiver ability to perform ulcer/skin care regimen upon admission and as needed Assess ulceration(s) every visit Provide education on ulcer and skin care Notes: Electronic Signature(s) Signed: 03/28/2019 6:00:33 PM By: Shawn Stall Entered By: Shawn Stall on 03/28/2019 11:27:14 -------------------------------------------------------------------------------- Pain Assessment Details Patient Name: Date of Service: [name], BOLDS 03/28/2019 11:30 AM Medical Record [id number] Patient Account Number: [id number] Date of Birth/Sex: Treating RN: 03-Jun-1955 (64 y.o. [name] Primary Care Jullian Clayson: Chevis Pretty Other Clinician: Referring Aris Even: Treating Alina Gilkey/Extender:Robson, [name], [facility] Weeks in Treatment: 8 Active Problems Location of Pain Severity and Description of Pain Patient Has Paino No Site Locations Pain Management and  Medication Current Pain Management: Electronic Signature(s) Signed: 03/28/2019 6:03:12 PM By: [name] Entered By: [name] on 03/28/2019 11:47:43 -------------------------------------------------------------------------------- Patient/Caregiver Education Details Patient Name: [name] 3/11/2021andnbsp11:30 Date of Service: AM Medical Record [id number] Number: Patient Account Number: [id number] Date of Birth/Gender: [birth year] (63 y.o. F) Treating RN: Shawn Stall Primary Care Other Clinician: Maryla Morrow, Physician: Humphrey Rolls Physician/ExtenderMaryla Morrow, Referring Physician: Glorianne [place] in Treatment: 8 Education Assessment Education Provided To: Patient Education Topics Provided Wound/Skin Impairment: Handouts: Skin Care Do's and Dont's Methods: Explain/Verbal Responses: Reinforcements needed Electronic Signature(s) Signed: 03/28/2019 6:00:33 PM By: Shawn Stall Entered By: Shawn Stall on 03/28/2019 11:27:25 -------------------------------------------------------------------------------- Wound Assessment Details Patient Name: Date of Service: [name], ZAFFINO 03/28/2019 11:30 AM Medical Record [id number] Patient Account Number: [id number] Date of Birth/Sex: Treating RN: 01-06-1956 (63 y.o. [name] Primary Care Maxum Cassarino: Chevis Pretty Other Clinician: Referring Kerin Cecchi: Treating Opal Mckellips/Extender:Robson, [name], [facility] Weeks in Treatment: 8 Wound Status Wound Number: 10 Primary Diabetic Wound/Ulcer of the Lower Extremity Etiology: Wound Location: Left Foot - Plantar, Lateral Wound Open Wounding Event: Blister Status: Date Acquired: 11/18/2018 Comorbid Glaucoma, Chronic sinus Weeks Of Treatment: 8 History: problems/congestion, Anemia, Sleep Apnea, Clustered Wound: No Hypertension, Type II Diabetes, Rheumatoid Arthritis, Neuropathy Wound Measurements Length:  (cm) 1.4 % Reduct Width: (cm) 1.5 % Reduct Depth: (cm) 1.9 Epitheli Area: (cm) 1.649 Tunneli Volume: (cm) 3.134 Undermi Wound Description Classification: Grade 2 Foul Odo Wound Margin: Thickened Slough/F Exudate Amount: Medium Exudate Type: Serosanguineous Exudate Color: red, brown Wound Bed Granulation Amount: Large (67-100%) Granulation Quality: Red Fascia  E Necrotic Amount: Small (1-33%) Fat Laye Necrotic Quality: Adherent Slough Tendon E Muscle E Nec Joint Ex Bone Exp Electronic Signature(s) Signed: 03/28/2019 6:03:12 PM By: [name] Entered By: [name] on 03/1 r After Cleansing: No ibrino Yes Exposed Structure xposed: No r (Subcutaneous Tissue) Exposed: Yes xposed: No xposed: Yes rosis of Muscle: No posed: No osed: Yes 01/2019 11:49:01 ion in Area: 6.3% ion in Volume: -18.8% alization: None ng: No ning: No -------------------------------------------------------------------------------- Wound Assessment Details Patient Name: Date of Service: [name], MECUM 03/28/2019 11:30 AM Medical Record [id number] Patient Account Number: [id number] Date of Birth/Sex: Treating RN: Mar 19, 1955 (64 y.o. [name] Primary Care Chenita Ruda: Chevis Pretty Other Clinician: Referring Kweli Grassel: Treating Tillie Viverette/Extender:Robson, [name], [facility] Weeks in Treatment: 8 Wound Status Wound Number: 9 Primary Diabetic Wound/Ulcer of the Lower Extremity Etiology: Wound Location: Right Foot - Medial Wound Open Wounding Event: Blister Status: Date Acquired: 06/18/2018 Comorbid Glaucoma, Chronic sinus Weeks Of Treatment: 8 History: problems/congestion, Anemia, Sleep Apnea, Clustered Wound: No Hypertension, Type II Diabetes, Rheumatoid Arthritis, Neuropathy Wound Measurements Length: (cm) 4 Width: (cm) 3 Depth: (cm) 0.1 Area: (cm) 9.425 Volume: (cm) 0.942 Wound Description Classification: Grade 2 Wound Margin:  Thickened Exudate Amount: Large Exudate Type: Serosanguineous Exudate Color: red, brown Wound Bed Granulation Amount: Large (67-100%) Granulation Quality: Red, Pink Necrotic Amount: Small (1-33%) Necrotic Quality: Adherent Slough After Cleansing: No rino Yes Exposed Structure osed: No (Subcutaneous Tissue) Exposed: Yes osed: No osed: No sed: No ed: No % Reduction in Area: 14.3% % Reduction in Volume: 90.5% Epithelialization: Small (1-33%) Tunneling: No Undermining: No Foul Odor Slough/Fib Fascia Exp Fat Layer Tendon Exp Muscle Exp Joint Expo Bone Expos Electronic Signature(s) Signed: 03/28/2019 6:03:12 PM By: [name] Entered By: [name] on 03/28/2019 11:49:11 -------------------------------------------------------------------------------- Vitals Details Patient Name: Date of Service: [name]. 03/28/2019 11:30 AM Medical Record [id number] Patient Account Number: [id number] Date of Birth/Sex: Treating RN: 15-Nov-1955 (64 y.o. [name] Primary Care Savahanna Almendariz: Chevis Pretty Other Clinician: Referring Darcie Mellone: Treating Tresea Heine/Extender:Robson, [name], [facility] Weeks in Treatment: 8 Vital Signs Time Taken: 11:45 Temperature (F): 98.3 Height (in): 68 Pulse (bpm): 78 Weight (lbs): 265 Respiratory Rate (breaths/min): 18 Body Mass Index (BMI): 40.3 Blood Pressure (mmHg): 133/57 Reference Range: 80 - 120 mg / dl Electronic Signature(s) Signed: 03/28/2019 6:03:12 PM By: [name] Entered By: [name] on 03/28/2019 11:47:32

## 2019-03-30 NOTE — Progress Notes (Signed)
[name], [name] ([id number]) Visit Report for 03/28/2019 HPI Details Patient Name: Date of Service: [name], [name] 03/28/2019 11:30 AM Medical Record [id number] Patient Account Number: [id number] Date of Birth/Sex: Treating RN: 03/16/1955 (64 y.o. [name] Primary Care Provider: Chevis Pretty Other Clinician: Referring Provider: Treating Provider/Extender:Delyle Weider, [name], [facility] Weeks in Treatment: 8 History of Present Illness HPI Description: 64 yrs old bf here for follow up recurrent left charcot foot ulcer. She has DM. She is not a smoker. She states a blister developed 3 months ago at site of previous breakdown from 1 year ago. It was debrided at the podiatrist's office. She went to the ER and then sent here. She denies pain. Xray was negative for osteo. Culture from this clinic negative. 09/08/[name] to [name] for Charcot foot. Seen by him and MRI scheduled 09/19/14. Prior silver collagen but this was not ordered last visit, patient has been rinsing with saline alone. Re institute silver collagen every other day. F/u 2 weeks with [name] as Labor Day holiday. Supplies ordered. 09/25/14; this is a patient with a Charcot foot on the left. she has a wound over the plantar aspect of her left calcaneus. She has been seen by [name] of orthopedic surgery and has had an MRI. She is apparently going within the next week or 2 for corrective surgery which will involve an external fixator. I don't have any of the details of this. 10/06/14; The patient is a 64 yrs old bf with a left Charcot foot and ulcer on the plantar. 12/01/14 Returns post surgery with [name]. Has follow up visit later this week with him, currently in facility and [facility] over this extremity. States no drainage from foot and no current wound care. On exam callus and scab in place, suspect healed. Has external fixator in place. Will defer to [name]  for management, ok to place moisturizing cream over scabs and callus and she will call if she requires follow up here. READMISSION 01/28/2019 Patient was in the clinic in 2016 for a wound on the plantar aspect of her left calcaneus. Predominantly cared for by [name] she was referred to [name] and had corrective surgery for the position of her left ankle I believe. She had previously been here in 2015 and then prior to that in 2011 2012 that I do not have these records. More recently she has developed fairly extensive wounds on the right medial foot, left lateral foot and a small area on the right medial great toe. Right medial foot wound has been there for 6 to 7 months, left foot wound for 2 to 3 months and the right great toe only over the last few weeks. She has been followed by [name] at Dr. Laverta [place] office it sounds as though she has been undergoing callus paring and silver nitrate. The patient has been washing these off with soap and water and plan applying dry dressing Past medical history, type 2 diabetes well controlled with a recent hemoglobin A1c of 7 on 11/16, type 2 diabetes with peripheral neuropathy, previous left Charcot joint surgery bilateral Charcot joints currently, stage III chronic renal failure, hypertension, obstructive sleep apnea, history of MRSA and gastroesophageal reflux. 1/18; this is a patient with bilateral Charcot feet. Plain x-rays that I did of both of these wounds that are either at bone or precariously close to bone were done. On the right foot this showed possible lytic destruction involving the anterior aspect  of the talus which may represent a wound osteomyelitis. An MRI was recommended. On the left there was no definite lytic destruction although the wound is deep undermining's and I think probably requires an MRI on this basis in any case 1/25; patient was supposed to have her MRI last Friday of her bilateral feet however they would not  do it because there was silver alginate in her dressings, will replace with calcium alginate today and will see if we can get her done today 2/1; patient did not get her MRIs done last week because of issues with the MRI department receiving orders to do bilateral feet. She has 2 deep wounds in the setting of Charcot feet deformities bilaterally. Both of them at one point had exposed bone although I had trouble demonstrating that today. I am not sure that we can offload these very aggressively as I watched her leave the clinic last week her gait is very narrow and unsteady. 2/15; the MRI of her right foot did not show osteomyelitis potentially osteonecrosis of the talus. 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 The wounds are a large wound on the right medial foot and on the left a substantial wound on the left lateral plantar calcaneus. We have been using silver alginate. Ultimately I think we need to consider a total contact cast on the right while we work through the possibility of osteomyelitis in the left. I watched her walk out with her crutches I have some trepidation about the ability to walk with the cast on her right foot. Nevertheless with her Charcot deformities I do not think there is a way to heal these short of offloading them aggressively. 2/22; the culture of the bone in the left foot that I did last week showed a few Citrobacter Koseri as well as some streptococcal species. In my attempted bone for pathology did not actually yield a lot of bone the pathologist did not identify any osteomyelitis. 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 Is promised today and aggressive debridement of the right foot and a total contact cast which surprisingly she seemed to tolerate quite well 03/13/2019  upon evaluation today patient appears to be doing well with her total contact cast she is here for the first cast change. She had no areas of rubbing or any other complications at this point. Overall things are doing quite well. 2/26; the patient was kindly seen by Dr. Orvan Falconer of infectious disease on 2/25. She is now on IV vancomycin PICC line placement in 2 days and oral Flagyl. This was in response to her MRI showing osteomyelitis of the left calcaneus concerning for osteomyelitis. We have been putting her right leg in a total contact cast. Our nurses report drainage. She is tolerating the total contact cast well. 3/4; the patient is started her IV antibiotics and is taking IV vancomycin and oral Flagyl. She appears to be tolerating these both well. This is for osteomyelitis involving the left calcaneus. The area on the right in the setting of bilateral Charcot deformities did not have any bone infection on MRI. We put a total contact cast on her with silver alginate as the primary dressing and she returns today in follow-up. Per our intake nurse there was too much drainage to leave this on all week therefore  we will bring her back for an early change 3/8; follow-up total contact cast she is still having a lot of drainage probably too much drainage from the right foot wound will week with the same cast. She will be back on Thursday. The area on the left looks somewhat better 3/11; patient here for a total contact cast change. Electronic Signature(s) Signed: 03/30/2019 7:03:15 AM By: Baltazar Najjar MD Entered By: Baltazar Najjar on 03/28/2019 12:25:07 -------------------------------------------------------------------------------- Physical Exam Details Patient Name: Date of Service: Sarah Jaeger. 03/28/2019 11:30 AM Medical Record [id number] Patient Account Number: [id number] Date of Birth/Sex: Treating RN: 16-Apr-1955 (63 y.o. [name] Primary Care Provider: Chevis Pretty Other Clinician: Referring Provider: Chevis Pretty Treating Provider/Extender:Ahuva Poynor, Lamar Sprinkles in Treatment: 8 Notes Wound exam; the patient was brought in for a total cost contact cast change. Apparently the wound looked healthy it was not reviewed by the provider today. Total contact cast changed in standard fashion no change in the dressing Electronic Signature(s) Signed: 03/30/2019 7:03:15 AM By: Baltazar Najjar MD Entered By: Baltazar Najjar on 03/28/2019 12:25:37 -------------------------------------------------------------------------------- Physician Orders Details Patient Name: Date of Service: Sarah Jaeger. 03/28/2019 11:30 AM Medical Record [id number] Patient Account Number: [id number] Date of Birth/Sex: Treating RN: Oct 28, 1955 (63 y.o. [name], [name] Primary Care Provider: Chevis Pretty Other Clinician: Referring Provider: Treating Provider/Extender:Daryle Amis, [name], [facility] Weeks in Treatment: 8 Verbal / Phone Orders: No Diagnosis Coding ICD-10 Coding Code Description E11.621 Type 2 diabetes mellitus with foot ulcer L97.524 Non-pressure chronic ulcer of other part of left foot with necrosis of bone L97.511 Non-pressure chronic ulcer of other part of right foot limited to breakdown of skin L97.514 Non-pressure chronic ulcer of other part of right foot with necrosis of bone E11.42 Type 2 diabetes mellitus with diabetic polyneuropathy M14.671 Charcot's joint, right ankle and foot M14.672 Charcot's joint, left ankle and foot Follow-up Appointments Return Appointment in 1 week. - Monday Return Appointment in: - Thursday for cast change Dressing Change Frequency Wound #10 Left,Lateral,Plantar Foot Change Dressing every other day. Wound #9 Right,Medial Foot Do not change entire dressing for one week. Wound Cleansing May shower with protection. Primary Wound Dressing Wound #10 Left,Lateral,Plantar Foot Calcium  Alginate with Silver - lightly pack Wound #9 Right,Medial Foot Calcium Alginate with Silver Secondary Dressing Wound #10 Left,Lateral,Plantar Foot Kerlix/Rolled Gauze Dry Gauze Other: - foam donut Wound #9 Right,Medial Foot Kerlix/Rolled Gauze Zetuvit or Kerramax Carboflex Off-Loading Total Contact Cast to Right Lower Extremity Home Health Continue Home Health skilled nursing for wound care. - Advanced/Adapt Electronic Signature(s) Signed: 03/28/2019 6:00:33 PM By: Shawn Stall Signed: 03/30/2019 7:03:15 AM By: Baltazar Najjar MD Entered By: Shawn Stall on 03/28/2019 12:22:31 -------------------------------------------------------------------------------- Problem List Details Patient Name: Date of Service: Sarah Jaeger. 03/28/2019 11:30 AM Medical Record [id number] Patient Account Number: [id number] Date of Birth/Sex: Treating RN: 08-27-1955 (63 y.o. [name] Primary Care Provider: Chevis Pretty Other Clinician: Referring Provider: Treating Provider/Extender:Kalila Adkison, [name], [facility] Weeks in Treatment: 8 Active Problems ICD-10 Evaluated Encounter Code Description Active Date Today Diagnosis E11.621 Type 2 diabetes mellitus with foot ulcer 01/28/2019 No Yes L97.524 Non-pressure chronic ulcer of other part of left foot 01/28/2019 No Yes with necrosis of bone L97.511 Non-pressure chronic ulcer of other part of right foot 01/28/2019 No Yes limited to breakdown of skin L97.514 Non-pressure chronic ulcer of other part of right foot 01/28/2019 No Yes with necrosis of bone E11.42 Type 2 diabetes mellitus with diabetic polyneuropathy 01/28/2019 No Yes M14.671 Charcot's joint, right ankle  and foot 01/28/2019 No Yes M14.672 Charcot's joint, left ankle and foot 01/28/2019 No Yes Inactive Problems Resolved Problems Electronic Signature(s) Signed: 03/30/2019 7:03:15 AM By: Linton Ham MD Entered By: Linton Ham on 03/28/2019  12:22:58 -------------------------------------------------------------------------------- Progress Note Details Patient Name: Date of Service: Sarah Libra. 03/28/2019 11:30 AM Medical Record [id number] Patient Account Number: [id number] Date of Birth/Sex: Treating RN: July 29, 1955 (64 y.o. [name] Primary Care Provider: Dewayne Hatch Other Clinician: Referring Provider: Treating Provider/Extender:Djuana Littleton, [name], [facility] Weeks in Treatment: 8 Subjective History of Present Illness (HPI) 64 yrs old bf here for follow up recurrent left charcot foot ulcer. She has DM. She is not a smoker. She states a blister developed 3 months ago at site of previous breakdown from 1 year ago. It was debrided at the podiatrist's office. She went to the ER and then sent here. She denies pain. Xray was negative for osteo. Culture from this clinic negative. 09/08/14 [address]erred [address] [name] to [name] for Charcot foot. Seen by him and MRI scheduled 09/19/14. Prior silver collagen but this was not ordered last visit, patient has been rinsing with saline alone. Re institute silver collagen every other day. F/u 2 weeks with Dr. Dellia Nims as Labor Day holiday. Supplies ordered. 09/25/14; this is a patient with a Charcot foot on the left. she has a wound over the plantar aspect of her left calcaneus. She has been seen by [name] of orthopedic surgery and has had an MRI. She is apparently going within the next week or 2 for corrective surgery which will involve an external fixator. I don't have any of the details of this. 10/06/14; The patient is a 64 yrs old bf with a left Charcot foot and ulcer on the plantar. 12/01/14 Returns post surgery with [name]. Has follow up visit later this week with him, currently in facility and [facility] over this extremity. States no drainage from foot and no current wound care. On exam callus and scab in place, suspect healed. Has external fixator in  place. Will defer to [name] for management, ok to place moisturizing cream over scabs and callus and she will call if she requires follow up here. READMISSION 01/28/2019 Patient was in the clinic in 2016 for a wound on the plantar aspect of her left calcaneus. Predominantly cared for by Dr. [name] she was referred to [name] and had corrective surgery for the position of her left ankle I believe. She had previously been here in 2015 and then prior to that in 2011 2012 that I do not have these records. More recently she has developed fairly extensive wounds on the right medial foot, left lateral foot and a small area on the right medial great toe. Right medial foot wound has been there for 6 to 7 months, left foot wound for 2 to 3 months and the right great toe only over the last few weeks. She has been followed by [name] at Dr. [name] office it sounds as though she has been undergoing callus paring and silver nitrate. The patient has been washing these off with soap and water and plan applying dry dressing Past medical history, type 2 diabetes well controlled with a recent hemoglobin A1c of 7 on 11/16, type 2 diabetes with peripheral neuropathy, previous left Charcot joint surgery bilateral Charcot joints currently, stage III chronic renal failure, hypertension, obstructive sleep apnea, history of MRSA and gastroesophageal reflux. 1/18; this is a patient with bilateral Charcot feet. Plain x-rays that I  did of both of these wounds that are either at bone or precariously close to bone were done. On the right foot this showed possible lytic destruction involving the anterior aspect of the talus which may represent a wound osteomyelitis. An MRI was recommended. On the left there was no definite lytic destruction although the wound is deep undermining's and I think probably requires an MRI on this basis in any case 1/25; patient was supposed to have her MRI last Friday of her  bilateral feet however they would not do it because there was silver alginate in her dressings, will replace with calcium alginate today and will see if we can get her done today 2/1; patient did not get her MRIs done last week because of issues with the MRI department receiving orders to do bilateral feet. She has 2 deep wounds in the setting of Charcot feet deformities bilaterally. Both of them at one point had exposed bone although I had trouble demonstrating that today. I am not sure that we can offload these very aggressively as I watched her leave the clinic last week her gait is very narrow and unsteady. 2/15; the MRI of her right foot did not show osteomyelitis potentially osteonecrosis of the talus. 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 The wounds are a large wound on the right medial foot and on the left a substantial wound on the left lateral plantar calcaneus. We have been using silver alginate. Ultimately I think we need to consider a total contact cast on the right while we work through the possibility of osteomyelitis in the left. I watched her walk out with her crutches I have some trepidation about the ability to walk with the cast on her right foot. Nevertheless with her Charcot deformities I do not think there is a way to heal these short of offloading them aggressively. 2/22; the culture of the bone in the left foot that I did last week showed a few Citrobacter Koseri as well as some streptococcal species. In my attempted bone for pathology did not actually yield a lot of bone the pathologist did not identify any osteomyelitis. 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 Is promised today and aggressive debridement of the right foot and a total contact cast which surprisingly  she seemed to tolerate quite well 03/13/2019 upon evaluation today patient appears to be doing well with her total contact cast she is here for the first cast change. She had no areas of rubbing or any other complications at this point. Overall things are doing quite well. 2/26; the patient was kindly seen by Dr. Orvan Falconer of infectious disease on 2/25. She is now on IV vancomycin PICC line placement in 2 days and oral Flagyl. This was in response to her MRI showing osteomyelitis of the left calcaneus concerning for osteomyelitis. We have been putting her right leg in a total contact cast. Our nurses report drainage. She is tolerating the total contact cast well. 3/4; the patient is started her IV antibiotics and is taking IV vancomycin and oral Flagyl. She appears to be tolerating these both well. This is for osteomyelitis involving the left calcaneus. The area on the right in the setting of bilateral Charcot deformities did not have any bone infection on MRI. We put a total contact cast  on her with silver alginate as the primary dressing and she returns today in follow-up. Per our intake nurse there was too much drainage to leave this on all week therefore we will bring her back for an early change 3/8; follow-up total contact cast she is still having a lot of drainage probably too much drainage from the right foot wound will week with the same cast. She will be back on Thursday. The area on the left looks somewhat better 3/11; patient here for a total contact cast change. Objective Constitutional Vitals Time Taken: 11:45 AM, Height: 68 in, Weight: 265 lbs, BMI: 40.3, Temperature: 98.3 F, Pulse: 78 bpm, Respiratory Rate: 18 breaths/min, Blood Pressure: 133/57 mmHg. Integumentary (Hair, Skin) Wound #10 status is Open. Original cause of wound was Blister. The wound is located on the Left,Lateral,Plantar Foot. The wound measures 1.4cm length x 1.5cm width x 1.9cm depth; 1.649cm^2 area and  3.134cm^3 volume. There is bone, muscle, and Fat Layer (Subcutaneous Tissue) Exposed exposed. There is no tunneling or undermining noted. There is a medium amount of serosanguineous drainage noted. The wound margin is thickened. There is large (67-100%) red granulation within the wound bed. There is a small (1-33%) amount of necrotic tissue within the wound bed including Adherent Slough. Wound #9 status is Open. Original cause of wound was Blister. The wound is located on the Right,Medial Foot. The wound measures 4cm length x 3cm width x 0.1cm depth; 9.425cm^2 area and 0.942cm^3 volume. There is Fat Layer (Subcutaneous Tissue) Exposed exposed. There is no tunneling or undermining noted. There is a large amount of serosanguineous drainage noted. The wound margin is thickened. There is large (67-100%) red, pink granulation within the wound bed. There is a small (1-33%) amount of necrotic tissue within the wound bed including Adherent Slough. Assessment Active Problems ICD-10 Type 2 diabetes mellitus with foot ulcer Non-pressure chronic ulcer of other part of left foot with necrosis of bone Non-pressure chronic ulcer of other part of right foot limited to breakdown of skin Non-pressure chronic ulcer of other part of right foot with necrosis of bone Type 2 diabetes mellitus with diabetic polyneuropathy Charcot's joint, right ankle and foot Charcot's joint, left ankle and foot Procedures Wound #9 Pre-procedure diagnosis of Wound #9 is a Diabetic Wound/Ulcer of the Lower Extremity located on the Right,Medial Foot . There was a Total Contact Cast Procedure by [name], MD. Post procedure Diagnosis Wound #9: Same as Pre-Procedure Plan Follow-up Appointments: Return Appointment in 1 week. - Monday Return Appointment in: - Thursday for cast change Dressing Change Frequency: Wound #10 Left,Lateral,Plantar Foot: Change Dressing every other day. Wound #9 Right,Medial Foot: Do not  change entire dressing for one week. Wound Cleansing: May shower with protection. Primary Wound Dressing: Wound #10 Left,Lateral,Plantar Foot: Calcium Alginate with Silver - lightly pack Wound #9 Right,Medial Foot: Calcium Alginate with Silver Secondary Dressing: Wound #10 Left,Lateral,Plantar Foot: Kerlix/Rolled Gauze Dry Gauze Other: - foam donut Wound #9 Right,Medial Foot: Kerlix/Rolled Gauze Zetuvit or Kerramax Carboflex Off-Loading: Total Contact Cast to Right Lower Extremity Home Health: Continue Home Health skilled nursing for wound care. - Advanced/Adapt 1. Continue with silver alginate on the wound 2. Total contact cast changed she has no complaints. 3. Tolerating IV antibiotics well Electronic Signature(s) Signed: 03/30/2019 7:03:15 AM By: Baltazar Najjar MD Entered By: Baltazar Najjar on 03/28/2019 12:26:08 -------------------------------------------------------------------------------- Total Contact Cast Details Patient Name: Date of Service: Sarah Jaeger. 03/28/2019 11:30 AM Medical Record [id number] Patient Account Number: [id number] Date of Birth/Sex:  Treating RN: 09/29/1955 (64 y.o. [name] Primary Care Provider: Chevis Pretty Other Clinician: Referring Provider: Chevis Pretty Treating Provider/Extender:Nichael Ehly, Lamar Sprinkles in Treatment: 8 Total Contact Cast Applied for Wound Assessment: Wound #9 Right,Medial Foot Performed By: Physician [name], MD Post Procedure Diagnosis Same as Pre-procedure Electronic Signature(s) Signed: 03/30/2019 7:03:15 AM By: Baltazar Najjar MD Entered By: Baltazar Najjar on 03/28/2019 12:24:11 -------------------------------------------------------------------------------- SuperBill Details Patient Name: Date of Service: Sarah Jaeger 03/28/2019 Medical Record [id number] Patient Account Number: [id number] Date of Birth/Sex: Treating RN: 31-Jul-1955 (63 y.o. [name],  [name] Primary Care Provider: Chevis Pretty Other Clinician: Referring Provider: Treating Provider/Extender:Kincaid Tiger, [name], [facility] Weeks in Treatment: 8 Diagnosis Coding ICD-10 Codes Code Description E11.621 Type 2 diabetes mellitus with foot ulcer L97.524 Non-pressure chronic ulcer of other part of left foot with necrosis of bone L97.511 Non-pressure chronic ulcer of other part of right foot limited to breakdown of skin L97.514 Non-pressure chronic ulcer of other part of right foot with necrosis of bone E11.42 Type 2 diabetes mellitus with diabetic polyneuropathy M14.671 Charcot's joint, right ankle and foot M14.672 Charcot's joint, left ankle and foot Facility Procedures CPT4 Code Description: [id number] - APPLY TOTAL CONTACT LEG CAST ICD-10 Diagnosis Description L97.511 Non-pressure chronic ulcer of other part of right foot limit E11.621 Type 2 diabetes mellitus with foot ulcer Modifier: ed to breakdo Quantity: 1 wn of skin Physician Procedures Electronic Signature(s) Signed: 03/30/2019 7:03:15 AM By: Baltazar Najjar MD Entered By: Baltazar Najjar on 03/28/2019 12:26:19

## 2019-04-01 ENCOUNTER — Encounter (HOSPITAL_BASED_OUTPATIENT_CLINIC_OR_DEPARTMENT_OTHER): Payer: Medicare Other | Admitting: Internal Medicine

## 2019-04-01 ENCOUNTER — Other Ambulatory Visit: Payer: Self-pay

## 2019-04-01 DIAGNOSIS — E11621 Type 2 diabetes mellitus with foot ulcer: Secondary | ICD-10-CM | POA: Diagnosis not present

## 2019-04-02 NOTE — Progress Notes (Signed)
Sarah, [name] ([id number]) Visit Report for 04/01/2019 Debridement Details Patient Name: Date of Service: [name], [name] 04/01/2019 3:15 PM Medical Record [id number] Patient Account Number: [id number] Date of Birth/Sex: Treating RN: Aug 10, 1955 (64 y.o. [name] Primary Care Provider: Chevis Pretty Other Clinician: Referring Provider: Chevis Pretty Treating Provider/Extender:Charon Smedberg, Lamar Sprinkles in Treatment: 9 Debridement Performed for Wound #9 Right,Medial Foot Assessment: Performed By: Physician [name], MD Debridement Type: Debridement Severity of Tissue Pre Fat layer exposed Debridement: Level of Consciousness (Pre- Awake and Alert procedure): Pre-procedure Verification/Time Out Taken: Yes - 15:55 Start Time: 15:55 Pain Control: Other : Benzocaine 20% Total Area Debrided (L x W): 4 (cm) x 3 (cm) = 12 (cm) Tissue and other material Viable, Non-Viable, Callus, Subcutaneous debrided: Level: Skin/Subcutaneous Tissue Debridement Description: Excisional Instrument: Blade, Forceps Bleeding: Moderate Hemostasis Achieved: Silver Nitrate End Time: 15:58 Procedural Pain: 0 Post Procedural Pain: 0 Response to Treatment: Procedure was tolerated well Level of Consciousness Awake and Alert (Post-procedure): Post Debridement Measurements of Total Wound Length: (cm) 4 Width: (cm) 3 Depth: (cm) 0.7 Volume: (cm) 6.597 Character of Wound/Ulcer Post Improved Debridement: Severity of Tissue Post Debridement: Fat layer exposed Post Procedure Diagnosis Same as Pre-procedure Electronic Signature(s) Signed: 04/01/2019 5:44:18 PM By: Zandra Abts RN, BSN Signed: 04/02/2019 10:32:20 AM By: Baltazar Najjar MD Entered By: Baltazar Najjar on 04/01/2019 17:29:50 -------------------------------------------------------------------------------- HPI Details Patient Name: Date of Service: [name]. 04/01/2019 3:15 PM Medical Record  [id number] Patient Account Number: [id number] Date of Birth/Sex: Treating RN: [birth year] (64 y.o. [name] Primary Care Provider: Chevis Pretty Other Clinician: Referring Provider: Treating Provider/Extender:April Colter, [name], [facility] Weeks in Treatment: 9 History of Present Illness HPI Description: 64 yrs old bf here for follow up recurrent left charcot foot ulcer. She has DM. She is not a smoker. She states a blister developed 3 months ago at site of previous breakdown from 1 year ago. It was debrided at the podiatrist's office. She went to the ER and then sent here. She denies pain. Xray was negative for osteo. Culture from this clinic negative. 09/08/14 [address]erred by [name] to [name] for Charcot foot. Seen by him and MRI scheduled 09/19/14. Prior silver collagen but this was not ordered last visit, patient has been rinsing with saline alone. Re institute silver collagen every other day. F/u 2 weeks with [name] as Labor Day holiday. Supplies ordered. 09/25/14; this is a patient with a Charcot foot on the left. she has a wound over the plantar aspect of her left calcaneus. She has been seen by [name] of orthopedic surgery and has had an MRI. She is apparently going within the next week or 2 for corrective surgery which will involve an external fixator. I don't have any of the details of this. 10/06/14; The patient is a 64 yrs old bf with a left Charcot foot and ulcer on the plantar. 12/01/14 Returns post surgery with [name]. Has follow up visit later this week with him, currently in facility and NWB over this extremity. States no drainage from foot and no current wound care. On exam callus and scab in place, suspect healed. Has external fixator in place. Will defer to [name] for management, ok to place moisturizing cream over scabs and callus and she will call if she requires follow up here. READMISSION 01/28/2019 Patient was in the clinic  in 2016 for a wound on the plantar aspect of her left calcaneus. Predominantly cared for by [name] she was  referred to [name] and had corrective surgery for the position of her left ankle I believe. She had previously been here in 2015 and then prior to that in 2011 2012 that I do not have these records. More recently she has developed fairly extensive wounds on the right medial foot, left lateral foot and a small area on the right medial great toe. Right medial foot wound has been there for 6 to 7 months, left foot wound for 2 to 3 months and the right great toe only over the last few weeks. She has been followed by [name] at Dr. Laverta [place] office it sounds as though she has been undergoing callus paring and silver nitrate. The patient has been washing these off with soap and water and plan applying dry dressing Past medical history, type 2 diabetes well controlled with a recent hemoglobin A1c of 7 on 11/16, type 2 diabetes with peripheral neuropathy, previous left Charcot joint surgery bilateral Charcot joints currently, stage III chronic renal failure, hypertension, obstructive sleep apnea, history of MRSA and gastroesophageal reflux. 1/18; this is a patient with bilateral Charcot feet. Plain x-rays that I did of both of these wounds that are either at bone or precariously close to bone were done. On the right foot this showed possible lytic destruction involving the anterior aspect of the talus which may represent a wound osteomyelitis. An MRI was recommended. On the left there was no definite lytic destruction although the wound is deep undermining's and I think probably requires an MRI on this basis in any case 1/25; patient was supposed to have her MRI last Friday of her bilateral feet however they would not do it because there was silver alginate in her dressings, will replace with calcium alginate today and will see if we can get her done today 2/1; patient did not get her  MRIs done last week because of issues with the MRI department receiving orders to do bilateral feet. She has 2 deep wounds in the setting of Charcot feet deformities bilaterally. Both of them at one point had exposed bone although I had trouble demonstrating that today. I am not sure that we can offload these very aggressively as I watched her leave the clinic last week her gait is very narrow and unsteady. 2/15; the MRI of her right foot did not show osteomyelitis potentially osteonecrosis of the talus. 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 The wounds are a large wound on the right medial foot and on the left a substantial wound on the left lateral plantar calcaneus. We have been using silver alginate. Ultimately I think we need to consider a total contact cast on the right while we work through the possibility of osteomyelitis in the left. I watched her walk out with her crutches I have some trepidation about the ability to walk with the cast on her right foot. Nevertheless with her Charcot deformities I do not think there is a way to heal these short of offloading them aggressively. 2/22; the culture of the bone in the left foot that I did last week showed a few Citrobacter Koseri as well as some streptococcal species. In my attempted bone for pathology did not actually yield a lot of bone the pathologist did not identify any osteomyelitis. Nevertheless based on the MRI, bone for culture and the probing wound into the bone itself I think this woman has osteomyelitis in the left foot and we will refer her  to infectious disease. Is promised today and aggressive debridement of the right foot and a total contact cast which surprisingly she seemed to tolerate quite well 03/13/2019 upon evaluation today patient appears to be doing well with her total contact cast she is here for the first cast change. She had no areas of rubbing or any other  complications at this point. Overall things are doing quite well. 2/26; the patient was kindly seen by Dr. Orvan Falconer of infectious disease on 2/25. She is now on IV vancomycin PICC line placement in 2 days and oral Flagyl. This was in response to her MRI showing osteomyelitis of the left calcaneus concerning for osteomyelitis. We have been putting her right leg in a total contact cast. Our nurses report drainage. She is tolerating the total contact cast well. 3/4; the patient is started her IV antibiotics and is taking IV vancomycin and oral Flagyl. She appears to be tolerating these both well. This is for osteomyelitis involving the left calcaneus. The area on the right in the setting of bilateral Charcot deformities did not have any bone infection on MRI. We put a total contact cast on her with silver alginate as the primary dressing and she returns today in follow-up. Per our intake nurse there was too much drainage to leave this on all week therefore we will bring her back for an early change 3/8; follow-up total contact cast she is still having a lot of drainage probably too much drainage from the right foot wound will week with the same cast. She will be back on Thursday. The area on the left looks somewhat better 3/11; patient here for a total contact cast change. 3/15; patient came in for wound care evaluation. She is still on IV vancomycin and oral Flagyl for osteomyelitis in the left foot. The area on the right foot we are putting in a total contact cast. Electronic Signature(s) Signed: 04/02/2019 10:32:20 AM By: Baltazar Najjar MD Entered By: Baltazar Najjar on 04/01/2019 17:30:23 -------------------------------------------------------------------------------- Physical Exam Details Patient Name: Date of Service: [name] 04/01/2019 3:15 PM Medical Record [id number] Patient Account Number: [id number] Date of Birth/Sex: Treating RN: [birth year] (64 y.o. [name] Primary Care Provider: Chevis Pretty Other Clinician: Referring Provider: Chevis Pretty Treating Provider/Extender:Miaa Latterell, Lamar Sprinkles in Treatment: 9 Constitutional Sitting or standing Blood Pressure is within target range for patient.. Pulse regular and within target range for patient.Marland Kitchen Respirations regular, non-labored and within target range.. Temperature is normal and within the target range for the patient.Marland Kitchen Appears in no distress. Cardiovascular Fetal pulses are palpable. Notes Wound exam; The area on the right foot had hyper granulation tissue. Also necrotic debris on the surface. I used a #10 scalpel to not back the hyper granulated tissue but very clearly there is nonviable debris on the surface. I also removed some callus from the circumference of the wound. Hemostasis with silver nitrate and direct pressure The area on the left foot looks about the same small punched out but no exposed bone Electronic Signature(s) Signed: 04/02/2019 10:32:20 AM By: Baltazar Najjar MD Entered By: Baltazar Najjar on 04/01/2019 17:31:57 -------------------------------------------------------------------------------- Physician Orders Details Patient Name: Date of Service: [name]. 04/01/2019 3:15 PM Medical Record [id number] Patient Account Number: [id number] Date of Birth/Sex: Treating RN: Sep 21, 1955 (64 y.o. [name] Primary Care Provider: Chevis Pretty Other Clinician: Referring Provider: Treating Provider/Extender:Nahal Wanless, [name], [facility] Weeks in Treatment: 9 Verbal / Phone Orders: No Diagnosis Coding ICD-10 Coding Code Description E11.621 Type 2 diabetes mellitus  with foot ulcer L97.524 Non-pressure chronic ulcer of other part of left foot with necrosis of bone L97.511 Non-pressure chronic ulcer of other part of right foot limited to breakdown of skin L97.514 Non-pressure chronic ulcer of other part of right  foot with necrosis of bone E11.42 Type 2 diabetes mellitus with diabetic polyneuropathy M14.671 Charcot's joint, right ankle and foot M14.672 Charcot's joint, left ankle and foot Follow-up Appointments Return Appointment in 1 week. - Monday Return Appointment in: - Thursday for cast change Dressing Change Frequency Wound #10 Left,Lateral,Plantar Foot Change Dressing every other day. Wound #9 Right,Medial Foot Do not change entire dressing for one week. Wound Cleansing May shower with protection. Primary Wound Dressing Wound #10 Left,Lateral,Plantar Foot Hydrofera Blue Wound #9 Right,Medial Foot Hydrofera Blue Secondary Dressing Wound #10 Left,Lateral,Plantar Foot Kerlix/Rolled Gauze Dry Gauze Other: - foam donut Wound #9 Right,Medial Foot Kerlix/Rolled Gauze Zetuvit or Kerramax Carboflex Off-Loading Total Contact Cast to Right [place] skilled nursing for wound care. - Advanced/[employer] Electronic Signature(s) Signed: 04/01/2019 5:44:18 PM By: Levan Hurst RN, BSN Signed: 04/02/2019 10:32:20 AM By: Linton Ham MD Entered By: Levan Hurst on 04/01/2019 15:59:36 -------------------------------------------------------------------------------- Problem List Details Patient Name: Date of Service: [facility] 04/01/2019 3:15 PM Medical Record [id number] Patient Account Number: [id number] Date of Birth/Sex: Treating RN: 05/07/1955 (63 y.o. [name] Primary Care Provider: Dewayne Hatch Other Clinician: Referring Provider: Treating Provider/Extender:Divon Krabill, [name], [facility] Weeks in Treatment: 9 Active Problems ICD-10 Evaluated Encounter Code Description Active Date Today Diagnosis E11.621 Type 2 diabetes mellitus with foot ulcer 01/28/2019 No Yes L97.524 Non-pressure chronic ulcer of other part of left foot 01/28/2019 No Yes with necrosis of bone L97.511 Non-pressure chronic ulcer of  other part of right foot 01/28/2019 No Yes limited to breakdown of skin L97.514 Non-pressure chronic ulcer of other part of right foot 01/28/2019 No Yes with necrosis of bone E11.42 Type 2 diabetes mellitus with diabetic polyneuropathy 01/28/2019 No Yes M14.671 Charcot's joint, right ankle and foot 01/28/2019 No Yes M14.672 Charcot's joint, left ankle and foot 01/28/2019 No Yes Inactive Problems Resolved Problems Electronic Signature(s) Signed: 04/02/2019 10:32:20 AM By: Linton Ham MD Entered By: Linton Ham on 04/01/2019 17:29:27 -------------------------------------------------------------------------------- Progress Note Details Patient Name: Date of Service: [name] 04/01/2019 3:15 PM Medical Record [id number] Patient Account Number: [id number] Date of Birth/Sex: Treating RN: March 05, 1955 (64 y.o. [name] Primary Care Provider: Dewayne Hatch Other Clinician: Referring Provider: Treating Provider/Extender:Paulmichael Schreck, [name], [facility] Weeks in Treatment: 9 Subjective History of Present Illness (HPI) 64 yrs old bf here for follow up recurrent left charcot foot ulcer. She has DM. She is not a smoker. She states a blister developed 3 months ago at site of previous breakdown from 1 year ago. It was debrided at the podiatrist's office. She went to the ER and then sent here. She denies pain. Xray was negative for osteo. Culture from this clinic negative. 09/08/14 Referred by [name] to [name] for Charcot foot. Seen by him and MRI scheduled 09/19/14. Prior silver collagen but this was not ordered last visit, patient has been rinsing with saline alone. Re institute silver collagen every other day. F/u 2 weeks with Dr. Dellia Nims as Labor Day holiday. Supplies ordered. 09/25/14; this is a patient with a Charcot foot on the left. she has a wound over the plantar aspect of her left calcaneus. She has been seen by [name] of orthopedic surgery and  has had an MRI. She is apparently  going within the next week or 2 for corrective surgery which will involve an external fixator. I don't have any of the details of this. 10/06/14; The patient is a 64 yrs old bf with a left Charcot foot and ulcer on the plantar. 12/01/14 Returns post surgery with [name]. Has follow up visit later this week with him, currently in facility and NWB over this extremity. States no drainage from foot and no current wound care. On exam callus and scab in place, suspect healed. Has external fixator in place. Will defer to [name] for management, ok to place moisturizing cream over scabs and callus and she will call if she requires follow up here. READMISSION 01/28/2019 Patient was in the clinic in 2016 for a wound on the plantar aspect of her left calcaneus. Predominantly cared for by [name] she was referred to [name] and had corrective surgery for the position of her left ankle I believe. She had previously been here in 2015 and then prior to that in 2011 2012 that I do not have these records. More recently she has developed fairly extensive wounds on the right medial foot, left lateral foot and a small area on the right medial great toe. Right medial foot wound has been there for 6 to 7 months, left foot wound for 2 to 3 months and the right great toe only over the last few weeks. She has been followed by [name] at Dr. Laverta [place] office it sounds as though she has been undergoing callus paring and silver nitrate. The patient has been washing these off with soap and water and plan applying dry dressing Past medical history, type 2 diabetes well controlled with a recent hemoglobin A1c of 7 on 11/16, type 2 diabetes with peripheral neuropathy, previous left Charcot joint surgery bilateral Charcot joints currently, stage III chronic renal failure, hypertension, obstructive sleep apnea, history of MRSA and gastroesophageal reflux. 1/18; this is a  patient with bilateral Charcot feet. Plain x-rays that I did of both of these wounds that are either at bone or precariously close to bone were done. On the right foot this showed possible lytic destruction involving the anterior aspect of the talus which may represent a wound osteomyelitis. An MRI was recommended. On the left there was no definite lytic destruction although the wound is deep undermining's and I think probably requires an MRI on this basis in any case 1/25; patient was supposed to have her MRI last Friday of her bilateral feet however they would not do it because there was silver alginate in her dressings, will replace with calcium alginate today and will see if we can get her done today 2/1; patient did not get her MRIs done last week because of issues with the MRI department receiving orders to do bilateral feet. She has 2 deep wounds in the setting of Charcot feet deformities bilaterally. Both of them at one point had exposed bone although I had trouble demonstrating that today. I am not sure that we can offload these very aggressively as I watched her leave the clinic last week her gait is very narrow and unsteady. 2/15; the MRI of her right foot did not show osteomyelitis potentially osteonecrosis of the talus. 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 The wounds are a large wound on the right medial foot and on the left a substantial wound on the left lateral plantar calcaneus. We have been using silver alginate. Ultimately I  think we need to consider a total contact cast on the right while we work through the possibility of osteomyelitis in the left. I watched her walk out with her crutches I have some trepidation about the ability to walk with the cast on her right foot. Nevertheless with her Charcot deformities I do not think there is a way to heal these short of offloading them aggressively. 2/22; the culture of the bone  in the left foot that I did last week showed a few Citrobacter Koseri as well as some streptococcal species. In my attempted bone for pathology did not actually yield a lot of bone the pathologist did not identify any osteomyelitis. 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 Is promised today and aggressive debridement of the right foot and a total contact cast which surprisingly she seemed to tolerate quite well 03/13/2019 upon evaluation today patient appears to be doing well with her total contact cast she is here for the first cast change. She had no areas of rubbing or any other complications at this point. Overall things are doing quite well. 2/26; the patient was kindly seen by Dr. Orvan Falconer of infectious disease on 2/25. She is now on IV vancomycin PICC line placement in 2 days and oral Flagyl. This was in response to her MRI showing osteomyelitis of the left calcaneus concerning for osteomyelitis. We have been putting her right leg in a total contact cast. Our nurses report drainage. She is tolerating the total contact cast well. 3/4; the patient is started her IV antibiotics and is taking IV vancomycin and oral Flagyl. She appears to be tolerating these both well. This is for osteomyelitis involving the left calcaneus. The area on the right in the setting of bilateral Charcot deformities did not have any bone infection on MRI. We put a total contact cast on her with silver alginate as the primary dressing and she returns today in follow-up. Per our intake nurse there was too much drainage to leave this on all week therefore we will bring her back for an early change 3/8; follow-up total contact cast she is still having a lot of drainage probably too much drainage from the right foot wound will week with the same cast. She will be back on  Thursday. The area on the left looks somewhat better 3/11; patient here for a total contact cast change. 3/15; patient came in for wound care evaluation. She is still on IV vancomycin and oral Flagyl for osteomyelitis in the left foot. The area on the right foot we are putting in a total contact cast. Objective Constitutional Sitting or standing Blood Pressure is within target range for patient.. Pulse regular and within target range for patient.Marland Kitchen Respirations regular, non-labored and within target range.. Temperature is normal and within the target range for the patient.Marland Kitchen Appears in no distress. Vitals Time Taken: 3:15 PM, Height: 68 in, Weight: 265 lbs, BMI: 40.3, Temperature: 97.7 F, Pulse: 79 bpm, Respiratory Rate: 18 breaths/min, Blood Pressure: 136/78 mmHg. Cardiovascular Fetal pulses are palpable. General Notes: Wound exam; ooThe area on the right foot had hyper granulation tissue. Also necrotic debris on the surface. I used a #10 scalpel to not back the hyper granulated tissue but very clearly there is nonviable debris on the surface. I also removed some callus from the circumference of the wound. Hemostasis  with silver nitrate and direct pressure ooThe area on the left foot looks about the same small punched out but no exposed bone Integumentary (Hair, Skin) Wound #10 status is Open. Original cause of wound was Blister. The wound is located on the Left,Lateral,Plantar Foot. The wound measures 1.4cm length x 1.5cm width x 0.9cm depth; 1.649cm^2 area and 1.484cm^3 volume. There is muscle and Fat Layer (Subcutaneous Tissue) Exposed exposed. There is no tunneling or undermining noted. There is a medium amount of serosanguineous drainage noted. The wound margin is thickened. There is large (67-100%) red, pink granulation within the wound bed. There is no necrotic tissue within the wound bed. Wound #9 status is Open. Original cause of wound was Blister. The wound is located on the  Right,Medial Foot. The wound measures 4cm length x 3cm width x 0.7cm depth; 9.425cm^2 area and 6.597cm^3 volume. There is Fat Layer (Subcutaneous Tissue) Exposed exposed. There is no tunneling or undermining noted. There is a large amount of purulent drainage noted. The wound margin is thickened. There is large (67-100%) red, pink granulation within the wound bed. There is no necrotic tissue within the wound bed. Assessment Active Problems ICD-10 Type 2 diabetes mellitus with foot ulcer Non-pressure chronic ulcer of other part of left foot with necrosis of bone Non-pressure chronic ulcer of other part of right foot limited to breakdown of skin Non-pressure chronic ulcer of other part of right foot with necrosis of bone Type 2 diabetes mellitus with diabetic polyneuropathy Charcot's joint, right ankle and foot Charcot's joint, left ankle and foot Procedures Wound #9 Pre-procedure diagnosis of Wound #9 is a Diabetic Wound/Ulcer of the Lower Extremity located on the Right,Medial Foot .Severity of Tissue Pre Debridement is: Fat layer exposed. There was a Excisional Skin/Subcutaneous Tissue Debridement with a total area of 12 sq cm performed by [name], MD. With the following instrument(s): Blade, and Forceps to remove Viable and Non-Viable tissue/material. Material removed includes Callus and Subcutaneous Tissue and after achieving pain control using Other (Benzocaine 20%). No specimens were taken. A time out was conducted at 15:55, prior to the start of the procedure. A Moderate amount of bleeding was controlled with Silver Nitrate. The procedure was tolerated well with a pain level of 0 throughout and a pain level of 0 following the procedure. Post Debridement Measurements: 4cm length x 3cm width x 0.7cm depth; 6.597cm^3 volume. Character of Wound/Ulcer Post Debridement is improved. Severity of Tissue Post Debridement is: Fat layer exposed. Post procedure Diagnosis Wound #9: Same  as Pre-Procedure Pre-procedure diagnosis of Wound #9 is a Diabetic Wound/Ulcer of the Lower Extremity located on the Right,Medial Foot . There was a Total Contact Cast Procedure by [name], MD. Post procedure Diagnosis Wound #9: Same as Pre-Procedure Plan Follow-up Appointments: Return Appointment in 1 week. - Monday Return Appointment in: - Thursday for cast change Dressing Change Frequency: Wound #10 Left,Lateral,Plantar Foot: Change Dressing every other day. Wound #9 Right,Medial Foot: Do not change entire dressing for one week. Wound Cleansing: May shower with protection. Primary Wound Dressing: Wound #10 Left,Lateral,Plantar Foot: Hydrofera Blue Wound #9 Right,Medial Foot: Hydrofera Blue Secondary Dressing: Wound #10 Left,Lateral,Plantar Foot: Kerlix/Rolled Gauze Dry Gauze Other: - foam donut Wound #9 Right,Medial Foot: Kerlix/Rolled Gauze Zetuvit or Kerramax Carboflex Off-Loading: Total Contact Cast to Right Lower Extremity Home Health: Continue Home Health skilled nursing for wound care. - Advanced/Adapt #1 I change the primary dressing to Hydrofera Blue/Kerramax/gauze/Curlex under a total contact cast. 2. Also put Hydrofera Blue on  the left foot. I did no debridement here but I think probably we can bring down the sides of this wound next time. 3. She is still having a lot of drainage necessitating 2 times a week total contact cast change. We will see her later in the week Electronic Signature(s) Signed: 04/02/2019 10:32:20 AM By: Baltazar Najjar MD Entered By: Baltazar Najjar on 04/01/2019 17:33:27 -------------------------------------------------------------------------------- Total Contact Cast Details Patient Name: Date of Service: DENIJAH, KARRER 04/01/2019 3:15 PM Medical Record [id number] Patient Account Number: [id number] Date of Birth/Sex: Treating RN: July 16, 1955 (63 y.o. [name] Primary Care Provider: Chevis Pretty  Other Clinician: Referring Provider: Chevis Pretty Treating Provider/Extender:Dillyn Menna, Lamar Sprinkles in Treatment: 9 Total Contact Cast Applied for Wound Assessment: Wound #9 Right,Medial Foot Performed By: Physician [name], MD Post Procedure Diagnosis Same as Pre-procedure Electronic Signature(s) Signed: 04/01/2019 5:44:18 PM By: Zandra Abts RN, BSN Signed: 04/02/2019 10:32:20 AM By: Baltazar Najjar MD Entered By: Zandra Abts on 04/01/2019 16:32:44 -------------------------------------------------------------------------------- SuperBill Details Patient Name: Date of Service: [facility] 04/01/2019 Medical Record [id number] Patient Account Number: [id number] Date of Birth/Sex: Treating RN: 1955/05/24 (63 y.o. 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 M14.671 Charcot's joint, right ankle and foot M14.672 Charcot's joint, left ankle and foot Facility Procedures CPT4 Code Description: 57846962 11042 - DEB SUBQ TISSUE 20 SQ CM/< ICD-10 Diagnosis Description L97.511 Non-pressure chronic ulcer of other part of right foot limi E11.621 Type 2 diabetes mellitus with foot ulcer Modifier: ted to breakdo Quantity: 1 wn of skin Physician Procedures CPT4 Code Description: 9528413 11042 - WC PHYS SUBQ TISS 20 SQ CM ICD-10 Diagnosis Description L97.511 Non-pressure chronic ulcer of other part of right foot  limi E11.621 Type 2 diabetes mellitus with foot ulcer Modifier: ted to breakdo Quantity: 1 wn of skin Electronic Signature(s) Signed: 04/02/2019 10:32:20 AM By: Baltazar Najjar MD Entered By: Baltazar Najjar on 04/01/2019 17:33:48

## 2019-04-03 ENCOUNTER — Encounter: Payer: Self-pay | Admitting: Internal Medicine

## 2019-04-03 ENCOUNTER — Other Ambulatory Visit: Payer: Self-pay

## 2019-04-03 ENCOUNTER — Ambulatory Visit (INDEPENDENT_AMBULATORY_CARE_PROVIDER_SITE_OTHER): Payer: Medicare Other | Admitting: Internal Medicine

## 2019-04-03 DIAGNOSIS — M86272 Subacute osteomyelitis, left ankle and foot: Secondary | ICD-10-CM | POA: Diagnosis present

## 2019-04-03 NOTE — Assessment & Plan Note (Signed)
I will give her 4 more weeks of IV ceftriaxone and oral metronidazole.  She will follow-up here in 6 weeks

## 2019-04-03 NOTE — Progress Notes (Signed)
[facility]  Patient Active Problem List   Diagnosis Date Noted  . Osteomyelitis of left foot (HCC) 03/02/2019    Priority: High  . Puncture wound of foot 08/08/2018  . Right ankle swelling 02/08/2018  . Obesity 05/18/2017  . Hyperlipidemia 11/12/2014  . Charcot foot due to diabetes mellitus (HCC) 11/06/2014  . CKD (chronic kidney disease) stage 3, GFR 30-59 ml/min 02/02/2013  . GERD (gastroesophageal reflux disease) 03/09/2012  . Routine adult health maintenance 05/26/2010  . Obstructive sleep apnea 05/26/2010  . Normocytic anemia 01/12/2010  . Diabetes mellitus type II, controlled (HCC) 06/17/2009  . Essential hypertension 06/17/2009    Patient's Medications  New Prescriptions   No medications on file  Previous Medications   ACCU-CHEK SOFTCLIX LANCETS LANCETS    Use to check blood glucose up to three times a day. Dx code E 11.9   ACETAMINOPHEN (TYLENOL) 500 MG TABLET    Take 500-1,000 mg by mouth every 6 (six) hours as needed for moderate pain or headache.   ASPIRIN EC 81 MG TABLET    Take 1 tablet (81 mg total) by mouth daily.   ATORVASTATIN (LIPITOR) 40 MG TABLET    TAKE ONE TABLET BY MOUTH DAILY   BIOTIN 1000 MCG TABLET    Take 1,000 mcg by mouth daily.   BRIMONIDINE-TIMOLOL (COMBIGAN) 0.2-0.5 % OPHTHALMIC SOLUTION    Place 1 drop into both eyes every 12 (twelve) hours. Place one drop to both eyes twice daily for glaucoma   CALCIUM-VITAMIN D (OSCAL-500) 500-400 MG-UNIT TABLET    Take 1 tablet by mouth 2 (two) times daily.    CHOLECALCIFEROL (VITAMIN D3) 25 MCG (1000 UT) CAPS    Take 1 capsule by mouth daily.   CYCLOSPORINE (RESTASIS) 0.05 % OPHTHALMIC EMULSION    Place 1 drop into both eyes 2 (two) times daily.   EMOLLIENT (BAG BALM EX)    Apply 1 application topically daily.   FERROUS SULFATE 325 (65 FE) MG TABLET    Take 1 tablet (325 mg total) by mouth daily with breakfast.   GLUCOSE BLOOD (ACCU-CHEK AVIVA) TEST STRIP    Use to check  blood glucose up to three times a day. Dx code E 11.9   [name]    TAKE ONE TABLET BY MOUTH TWICE A DAY   LISINOPRIL (ZESTRIL) 10 MG TABLET    Take 1 tablet (10 mg total) by mouth daily.   METRONIDAZOLE (FLAGYL) 500 MG TABLET    Take 1 tablet (500 mg total) by mouth 3 (three) times daily.   MULTIPLE VITAMIN (MULTIVITAMIN) TABLET    Take 1 tablet by mouth daily.    NETARSUDIL-LATANOPROST (ROCKLATAN) 0.02-0.005 % SOLN    Apply 1 drop to eye daily at 10 pm.   PANTOPRAZOLE (PROTONIX) 20 MG TABLET    1 tablet twice a day   POLYVINYL ALCOHOL-POVIDONE (REFRESH OP)    Place 1 drop into both eyes daily as needed (dry eyes).   VITAMIN B-12 (CYANOCOBALAMIN) 1000 MCG TABLET    Take 1,000 mcg by mouth daily.   VITAMIN C (ASCORBIC ACID) 500 MG TABLET    Take 500 mg by mouth daily.   ZINC 50 MG CAPS    Take 50 mg by mouth daily.   Modified Medications   No medications on file  Discontinued Medications   No medications on file    Subjective: [name] is in for her routine follow-up visit.  She has diabetes and neuropathy complicated by ulcers on both feet for several months.  She suffers with Charcot arthropathy.  Notes indicate that there has been exposed bone on both feet recently.  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  The MRI of her left foot showed cortical irregularity and edema of the calcaneus concerning for osteomyelitis.  She underwent bone biopsy on 03/04/2019.  No organisms were seen on stain but cultures grew Citrobacter koseri, strep mitis/oralis and strep anginosis. Pathology review showed only benign reactive changes.  Her C-reactive protein was elevated at 21.  I elected to start her on IV ceftriaxone and oral metronidazole.  She has now completed 2 weeks of therapy.  She feels like her left foot ulcer is improving.  She notes there has been decreased drainage.  She does note intermittent nausea without vomiting and a metallic  taste in her mouth due to metronidazole but feels like she will have no problems continuing to take it.  She was seen in the ED on 03/22/2019 and received TPA for a clotted PICC.  Review of Systems: Review of Systems  Constitutional: Negative for chills, diaphoresis and fever.  Gastrointestinal: Negative for abdominal pain, diarrhea, nausea and vomiting.  Musculoskeletal: Negative for joint pain.    Past Medical History:  Diagnosis Date  . Chronic normocytic anemia   . CKD (chronic kidney disease) stage 3, GFR 30-59 ml/min 02/02/2013  . Diabetes mellitus type II, controlled ([place]) 06/17/2009  . Essential hypertension 06/17/2009  . GERD (gastroesophageal reflux disease)   . Glaucoma   . H/O hiatal hernia   . Hyperlipidemia   . MRSA (methicillin resistant Staphylococcus aureus)   . Obstructive sleep apnea 05/26/2010    Social History   Tobacco Use  . Smoking status: Former Smoker    Packs/day: 0.50    Years: 0.00    Pack years: 0.00    Types: Cigarettes    Quit date: 12/22/1972    Years since quitting: 46.3  . Smokeless tobacco: Never Used  Substance Use Topics  . Alcohol use: No  . Drug use: No    Family History  Problem Relation Age of Onset  . Diabetes Mother   . Hypertension Mother   . Dementia Mother   . Heart disease Father   . Stroke Father   . Diabetes Father   . Hypertension Father   . Gout Father   . Deep vein thrombosis Father   . Breast cancer Cousin   . Breast cancer Cousin     Allergies  Allergen Reactions  . Codeine Itching    swelling in throat  . Pravastatin Other (See Comments)    cramps  . Latex Rash    Objective: Vitals:   04/03/19 1611  BP: (!) 151/79  Pulse: 80  Temp: 97.9 F (36.6 C)  TempSrc: Oral  SpO2: 100%  Weight: 262 lb (118.8 kg)   Body mass index is 38.69 kg/m.  Physical Exam Constitutional:      Comments: She is in good spirits.  Musculoskeletal:     Comments: Has a large chronic plantar ulcer on her left foot with  surrounding callus.  Only scant drainage on her dressing. There is no malodor.  No exposed bone.  Skin:    Comments: PICC site looks good.         Problem List Items Addressed This Visit      High   Osteomyelitis of left foot ([place])  I will give her 4 more weeks of IV ceftriaxone and oral metronidazole.  She will follow-up here in 6 weeks          Cliffton Asters, MD [facility] [phone] pager   [phone] cell 04/03/2019, 4:41 PM

## 2019-04-04 ENCOUNTER — Encounter (HOSPITAL_BASED_OUTPATIENT_CLINIC_OR_DEPARTMENT_OTHER): Payer: Medicare Other | Admitting: Internal Medicine

## 2019-04-04 DIAGNOSIS — E11621 Type 2 diabetes mellitus with foot ulcer: Secondary | ICD-10-CM | POA: Diagnosis not present

## 2019-04-04 NOTE — Progress Notes (Signed)
HADDY, MULLINAX ([id number]) Visit Report for 04/04/2019 HPI Details Patient Name: Date of Service: [name], [name] 04/04/2019 12:30 PM Medical Record [id number] Patient Account Number: [id number] Date of Birth/Sex: Treating RN: 1955-04-18 (64 y.o. [name] Primary Care Provider: Chevis Pretty Other Clinician: Referring Provider: Treating Provider/Extender:Eldin Bonsell, [name], [facility] Weeks in Treatment: 9 History of Present Illness HPI Description: 64 yrs old bf here for follow up recurrent left charcot foot ulcer. She has DM. She is not a smoker. She states a blister developed 3 months ago at site of previous breakdown from 1 year ago. It was debrided at the podiatrist's office. She went to the ER and then sent here. She denies pain. Xray was negative for osteo. Culture from this clinic negative. 09/08/[name] to [name] for Charcot foot. Seen by him and MRI scheduled 09/19/14. Prior silver collagen but this was not ordered last visit, patient has been rinsing with saline alone. Re institute silver collagen every other day. F/u 2 weeks with [name] as Labor Day holiday. Supplies ordered. 09/25/14; this is a patient with a Charcot foot on the left. she has a wound over the plantar aspect of her left calcaneus. She has been seen by [name] of orthopedic surgery and has had an MRI. She is apparently going within the next week or 2 for corrective surgery which will involve an external fixator. I don't have any of the details of this. 10/06/14; The patient is a 64 yrs old bf with a left Charcot foot and ulcer on the plantar. 12/01/14 Returns post surgery with [name]. Has follow up visit later this week with him, currently in facility and NWB over this extremity. States no drainage from foot and no current wound care. On exam callus and scab in place, suspect healed. Has external fixator in place. Will defer to [name]  for management, ok to place moisturizing cream over scabs and callus and she will call if she requires follow up here. READMISSION 01/28/2019 Patient was in the clinic in 2016 for a wound on the plantar aspect of her left calcaneus. Predominantly cared for by [name] she was referred to [name] and had corrective surgery for the position of her left ankle I believe. She had previously been here in 2015 and then prior to that in 2011 2012 that I do not have these records. More recently she has developed fairly extensive wounds on the right medial foot, left lateral foot and a small area on the right medial great toe. Right medial foot wound has been there for 6 to 7 months, left foot wound for 2 to 3 months and the right great toe only over the last few weeks. She has been followed by [name] at Dr. Laverta [place] office it sounds as though she has been undergoing callus paring and silver nitrate. The patient has been washing these off with soap and water and plan applying dry dressing Past medical history, type 2 diabetes well controlled with a recent hemoglobin A1c of 7 on 11/16, type 2 diabetes with peripheral neuropathy, previous left Charcot joint surgery bilateral Charcot joints currently, stage III chronic renal failure, hypertension, obstructive sleep apnea, history of MRSA and gastroesophageal reflux. 1/18; this is a patient with bilateral Charcot feet. Plain x-rays that I did of both of these wounds that are either at bone or precariously close to bone were done. On the right foot this showed possible lytic destruction involving the anterior aspect  of the talus which may represent a wound osteomyelitis. An MRI was recommended. On the left there was no definite lytic destruction although the wound is deep undermining's and I think probably requires an MRI on this basis in any case 1/25; patient was supposed to have her MRI last Friday of her bilateral feet however they would not  do it because there was silver alginate in her dressings, will replace with calcium alginate today and will see if we can get her done today 2/1; patient did not get her MRIs done last week because of issues with the MRI department receiving orders to do bilateral feet. She has 2 deep wounds in the setting of Charcot feet deformities bilaterally. Both of them at one point had exposed bone although I had trouble demonstrating that today. I am not sure that we can offload these very aggressively as I watched her leave the clinic last week her gait is very narrow and unsteady. 2/15; the MRI of her right foot did not show osteomyelitis potentially osteonecrosis of the talus. 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 The wounds are a large wound on the right medial foot and on the left a substantial wound on the left lateral plantar calcaneus. We have been using silver alginate. Ultimately I think we need to consider a total contact cast on the right while we work through the possibility of osteomyelitis in the left. I watched her walk out with her crutches I have some trepidation about the ability to walk with the cast on her right foot. Nevertheless with her Charcot deformities I do not think there is a way to heal these short of offloading them aggressively. 2/22; the culture of the bone in the left foot that I did last week showed a few Citrobacter Koseri as well as some streptococcal species. In my attempted bone for pathology did not actually yield a lot of bone the pathologist did not identify any osteomyelitis. 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 Is promised today and aggressive debridement of the right foot and a total contact cast which surprisingly she seemed to tolerate quite well 03/13/2019  upon evaluation today patient appears to be doing well with her total contact cast she is here for the first cast change. She had no areas of rubbing or any other complications at this point. Overall things are doing quite well. 2/26; the patient was kindly seen by Dr. Orvan Falconer of infectious disease on 2/25. She is now on IV vancomycin PICC line placement in 2 days and oral Flagyl. This was in response to her MRI showing osteomyelitis of the left calcaneus concerning for osteomyelitis. We have been putting her right leg in a total contact cast. Our nurses report drainage. She is tolerating the total contact cast well. 3/4; the patient is started her IV antibiotics and is taking IV vancomycin and oral Flagyl. She appears to be tolerating these both well. This is for osteomyelitis involving the left calcaneus. The area on the right in the setting of bilateral Charcot deformities did not have any bone infection on MRI. We put a total contact cast on her with silver alginate as the primary dressing and she returns today in follow-up. Per our intake nurse there was too much drainage to leave this on all week therefore  we will bring her back for an early change 3/8; follow-up total contact cast she is still having a lot of drainage probably too much drainage from the right foot wound will week with the same cast. She will be back on Thursday. The area on the left looks somewhat better 3/11; patient here for a total contact cast change. 3/15; patient came in for wound care evaluation. She is still on IV vancomycin and oral Flagyl for osteomyelitis in the left foot. The area on the right foot we are putting in a total contact cast. 3/18 for total contact cast change on the right foot Electronic Signature(s) Signed: 04/04/2019 7:02:26 PM By: Baltazar Najjar MD Entered By: Baltazar Najjar on 04/04/2019 13:37:10 -------------------------------------------------------------------------------- Physical Exam  Details Patient Name: Date of Service: [name]. 04/04/2019 12:30 PM Medical Record [id number] Patient Account Number: [id number] Date of Birth/Sex: Treating RN: Jun 02, 1955 (64 y.o. [name] Primary Care Provider: Chevis Pretty Other Clinician: Referring Provider: Treating Provider/Extender:Cashton Hosley, [name], [facility] Weeks in Treatment: 9 Constitutional Patient is hypertensive.. Pulse regular and within target range for patient.Marland Kitchen Respirations regular, non-labored and within target range.. Temperature is normal and within the target range for the patient.Marland Kitchen Appears in no distress. Notes Wound exam; the area on the right foot was not examined today. Total contact cast was changed and reapplied in the standard fashion Electronic Signature(s) Signed: 04/04/2019 7:02:26 PM By: Baltazar Najjar MD Entered By: Baltazar Najjar on 04/04/2019 13:38:42 -------------------------------------------------------------------------------- Physician Orders Details Patient Name: Date of Service: [name]. 04/04/2019 12:30 PM Medical Record [id number] Patient Account Number: [id number] Date of Birth/Sex: Treating RN: 1955-09-21 (63 y.o. [name], Yvonne Kendall Primary Care Provider: Chevis Pretty Other Clinician: Referring Provider: Treating Provider/Extender:Flem Enderle, [name], [facility] Weeks in Treatment: 9 Verbal / Phone Orders: No Diagnosis Coding ICD-10 Coding Code Description E11.621 Type 2 diabetes mellitus with foot ulcer L97.524 Non-pressure chronic ulcer of other part of left foot with necrosis of bone L97.511 Non-pressure chronic ulcer of other part of right foot limited to breakdown of skin L97.514 Non-pressure chronic ulcer of other part of right foot with necrosis of bone E11.42 Type 2 diabetes mellitus with diabetic polyneuropathy M14.671 Charcot's joint, right ankle and foot M14.672 Charcot's joint, left ankle and  foot Follow-up Appointments Return Appointment in 1 week. - Monday Return Appointment in: - Thursday for cast change Dressing Change Frequency Wound #10 Left,Lateral,Plantar Foot Change Dressing every other day. Wound #9 Right,Medial Foot Do not change entire dressing for one week. Wound Cleansing May shower with protection. Primary Wound Dressing Wound #10 Left,Lateral,Plantar Foot Hydrofera Blue Wound #9 Right,Medial Foot Hydrofera Blue Secondary Dressing Wound #10 Left,Lateral,Plantar Foot Kerlix/Rolled Gauze Dry Gauze Other: - foam donut Wound #9 Right,Medial Foot Kerlix/Rolled Gauze Zetuvit or Kerramax Carboflex Off-Loading Total Contact Cast to Right Lower Extremity Home Health Continue Home Health skilled nursing for wound care. - Advanced/Adapt Electronic Signature(s) Signed: 04/04/2019 4:55:35 PM By: Shawn Stall Signed: 04/04/2019 7:02:26 PM By: Baltazar Najjar MD Entered By: Shawn Stall on 04/04/2019 12:43:30 -------------------------------------------------------------------------------- Problem List Details Patient Name: Date of Service: [name]. 04/04/2019 12:30 PM Medical Record [id number] Patient Account Number: [id number] Date of Birth/Sex: Treating RN: [birth year] (63 y.o. [name] Primary Care Provider: Chevis Pretty Other Clinician: Referring Provider: Treating Provider/Extender:Michaeljoseph Revolorio, [name], [facility] Weeks in Treatment: 9 Active Problems ICD-10 Evaluated Encounter Code Description Active Date Today Diagnosis E11.621 Type 2 diabetes mellitus with foot ulcer 01/28/2019 No Yes L97.524 Non-pressure chronic ulcer of other part of left foot  01/28/2019 No Yes with necrosis of bone L97.511 Non-pressure chronic ulcer of other part of right foot 01/28/2019 No Yes limited to breakdown of skin L97.514 Non-pressure chronic ulcer of other part of right foot 01/28/2019 No Yes with necrosis of bone E11.42 Type 2  diabetes mellitus with diabetic polyneuropathy 01/28/2019 No Yes M14.671 Charcot's joint, right ankle and foot 01/28/2019 No Yes M14.672 Charcot's joint, left ankle and foot 01/28/2019 No Yes Inactive Problems Resolved Problems Electronic Signature(s) Signed: 04/04/2019 7:02:26 PM By: Linton Ham MD Entered By: Linton Ham on 04/04/2019 13:35:58 -------------------------------------------------------------------------------- Progress Note Details Patient Name: Date of Service: [name]. 04/04/2019 12:30 PM Medical Record [id number] Patient Account Number: [id number] Date of Birth/Sex: Treating RN: 1955/12/09 (64 y.o. [name] Primary Care Provider: Dewayne Hatch Other Clinician: Referring Provider: Treating Provider/Extender:Koy Lamp, [name], [facility] Weeks in Treatment: 9 Subjective History of Present Illness (HPI) 64 yrs old bf here for follow up recurrent left charcot foot ulcer. She has DM. She is not a smoker. She states a blister developed 3 months ago at site of previous breakdown from 1 year ago. It was debrided at the podiatrist's office. She went to the ER and then sent here. She denies pain. Xray was negative for osteo. Culture from this clinic negative. 09/08/14 [address] [name] to [name] for Charcot foot. Seen by him and MRI scheduled 09/19/14. Prior silver collagen but this was not ordered last visit, patient has been rinsing with saline alone. Re institute silver collagen every other day. F/u 2 weeks with Dr. Dellia Nims as Labor Day holiday. Supplies ordered. 09/25/14; this is a patient with a Charcot foot on the left. she has a wound over the plantar aspect of her left calcaneus. She has been seen by [name] of orthopedic surgery and has had an MRI. She is apparently going within the next week or 2 for corrective surgery which will involve an external fixator. I don't have any of the details of this. 10/06/14; The  patient is a 64 yrs old bf with a left Charcot foot and ulcer on the plantar. 12/01/14 Returns post surgery with [name]. Has follow up visit later this week with him, currently in facility and NWB over this extremity. States no drainage from foot and no current wound care. On exam callus and scab in place, suspect healed. Has external fixator in place. Will defer to [name] for management, ok to place moisturizing cream over scabs and callus and she will call if she requires follow up here. READMISSION 01/28/2019 Patient was in the clinic in 2016 for a wound on the plantar aspect of her left calcaneus. Predominantly cared for by Dr. [name] she was referred to [name] and had corrective surgery for the position of her left ankle I believe. She had previously been here in 2015 and then prior to that in 2011 2012 that I do not have these records. More recently she has developed fairly extensive wounds on the right medial foot, left lateral foot and a small area on the right medial great toe. Right medial foot wound has been there for 6 to 7 months, left foot wound for 2 to 3 months and the right great toe only over the last few weeks. She has been followed by [name] at Dr. [name] office it sounds as though she has been undergoing callus paring and silver nitrate. The patient has been washing these off with soap and water and plan applying dry dressing Past medical  history, type 2 diabetes well controlled with a recent hemoglobin A1c of 7 on 11/16, type 2 diabetes with peripheral neuropathy, previous left Charcot joint surgery bilateral Charcot joints currently, stage III chronic renal failure, hypertension, obstructive sleep apnea, history of MRSA and gastroesophageal reflux. 1/18; this is a patient with bilateral Charcot feet. Plain x-rays that I did of both of these wounds that are either at bone or precariously close to bone were done. On the right foot this showed possible  lytic destruction involving the anterior aspect of the talus which may represent a wound osteomyelitis. An MRI was recommended. On the left there was no definite lytic destruction although the wound is deep undermining's and I think probably requires an MRI on this basis in any case 1/25; patient was supposed to have her MRI last Friday of her bilateral feet however they would not do it because there was silver alginate in her dressings, will replace with calcium alginate today and will see if we can get her done today 2/1; patient did not get her MRIs done last week because of issues with the MRI department receiving orders to do bilateral feet. She has 2 deep wounds in the setting of Charcot feet deformities bilaterally. Both of them at one point had exposed bone although I had trouble demonstrating that today. I am not sure that we can offload these very aggressively as I watched her leave the clinic last week her gait is very narrow and unsteady. 2/15; the MRI of her right foot did not show osteomyelitis potentially osteonecrosis of the talus. 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 The wounds are a large wound on the right medial foot and on the left a substantial wound on the left lateral plantar calcaneus. We have been using silver alginate. Ultimately I think we need to consider a total contact cast on the right while we work through the possibility of osteomyelitis in the left. I watched her walk out with her crutches I have some trepidation about the ability to walk with the cast on her right foot. Nevertheless with her Charcot deformities I do not think there is a way to heal these short of offloading them aggressively. 2/22; the culture of the bone in the left foot that I did last week showed a few Citrobacter Koseri as well as some streptococcal species. In my attempted bone for pathology did not actually yield a lot of bone the  pathologist did not identify any osteomyelitis. 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 Is promised today and aggressive debridement of the right foot and a total contact cast which surprisingly she seemed to tolerate quite well 03/13/2019 upon evaluation today patient appears to be doing well with her total contact cast she is here for the first cast change. She had no areas of rubbing or any other complications at this point. Overall things are doing quite well. 2/26; the patient was kindly seen by Dr. Orvan Falconer of infectious disease on 2/25. She is now on IV vancomycin PICC line placement in 2 days and oral Flagyl. This was in response to her MRI showing osteomyelitis of the left calcaneus concerning for osteomyelitis. We have been putting her right leg in a total contact cast. Our nurses report drainage. She is tolerating the total contact cast well.  3/4; the patient is started her IV antibiotics and is taking IV vancomycin and oral Flagyl. She appears to be tolerating these both well. This is for osteomyelitis involving the left calcaneus. The area on the right in the setting of bilateral Charcot deformities did not have any bone infection on MRI. We put a total contact cast on her with silver alginate as the primary dressing and she returns today in follow-up. Per our intake nurse there was too much drainage to leave this on all week therefore we will bring her back for an early change 3/8; follow-up total contact cast she is still having a lot of drainage probably too much drainage from the right foot wound will week with the same cast. She will be back on Thursday. The area on the left looks somewhat better 3/11; patient here for a total contact cast change. 3/15; patient came in for wound care evaluation. She is still on IV vancomycin and  oral Flagyl for osteomyelitis in the left foot. The area on the right foot we are putting in a total contact cast. 3/18 for total contact cast change on the right foot Objective Constitutional Patient is hypertensive.. Pulse regular and within target range for patient.Marland Kitchen Respirations regular, non-labored and within target range.. Temperature is normal and within the target range for the patient.Marland Kitchen Appears in no distress. Vitals Time Taken: 12:43 PM, Height: 68 in, Weight: 265 lbs, BMI: 40.3, Temperature: 98 F, Pulse: 74 bpm, Respiratory Rate: 18 breaths/min, Blood Pressure: 152/55 mmHg. General Notes: Wound exam; the area on the right foot was not examined today. Total contact cast was changed and reapplied in the standard fashion Integumentary (Hair, Skin) Wound #10 status is Open. Original cause of wound was Blister. The wound is located on the Left,Lateral,Plantar Foot. The wound measures 1.4cm length x 1.5cm width x 0.9cm depth; 1.649cm^2 area and 1.484cm^3 volume. There is muscle and Fat Layer (Subcutaneous Tissue) Exposed exposed. There is no tunneling or undermining noted. There is a medium amount of serosanguineous drainage noted. The wound margin is thickened. There is large (67-100%) red, pink granulation within the wound bed. There is no necrotic tissue within the wound bed. Wound #9 status is Open. Original cause of wound was Blister. The wound is located on the Right,Medial Foot. The wound measures 4cm length x 3cm width x 0.7cm depth; 9.425cm^2 area and 6.597cm^3 volume. There is Fat Layer (Subcutaneous Tissue) Exposed exposed. There is no tunneling or undermining noted. There is a large amount of purulent drainage noted. The wound margin is thickened. There is large (67-100%) red, pink granulation within the wound bed. There is no necrotic tissue within the wound bed. Assessment Active Problems ICD-10 Type 2 diabetes mellitus with foot ulcer Non-pressure chronic ulcer of  other part of left foot with necrosis of bone Non-pressure chronic ulcer of other part of right foot limited to breakdown of skin Non-pressure chronic ulcer of other part of right foot with necrosis of bone Type 2 diabetes mellitus with diabetic polyneuropathy Charcot's joint, right ankle and foot Charcot's joint, left ankle and foot Procedures Wound #9 Pre-procedure diagnosis of Wound #9 is a Diabetic Wound/Ulcer of the Lower Extremity located on the Right,Medial Foot . There was a Total Contact Cast Procedure by [name], MD. Post procedure Diagnosis Wound #9: Same as Pre-Procedure Plan Follow-up Appointments: Return Appointment in 1 week. - Monday Return Appointment in: - Thursday for cast change Dressing Change Frequency: Wound #10 Left,Lateral,Plantar Foot: Change Dressing every other day.  Wound #9 Right,Medial Foot: Do not change entire dressing for one week. Wound Cleansing: May shower with protection. Primary Wound Dressing: Wound #10 Left,Lateral,Plantar Foot: Hydrofera Blue Wound #9 Right,Medial Foot: Hydrofera Blue Secondary Dressing: Wound #10 Left,Lateral,Plantar Foot: Kerlix/Rolled Gauze Dry Gauze Other: - foam donut Wound #9 Right,Medial Foot: Kerlix/Rolled Gauze Zetuvit or Kerramax Carboflex Off-Loading: Total Contact Cast to Right Lower Extremity Home Health: Continue Home Health skilled nursing for wound care. - Advanced/Adapt 1. Total contact cast change no difficulties. Electronic Signature(s) Signed: 04/04/2019 4:55:35 PM By: Shawn Stall Signed: 04/04/2019 7:02:26 PM By: Baltazar Najjar MD Entered By: Shawn Stall on 04/04/2019 13:40:35 -------------------------------------------------------------------------------- Total Contact Cast Details Patient Name: Date of Service: [name], [name] 04/04/2019 12:30 PM Medical Record [id number] Patient Account Number: [id number] Date of Birth/Sex: Treating RN: Dec 07, 1955 (64 y.o. [name] Primary Care Provider: Chevis Pretty Other Clinician: Referring Provider: Chevis Pretty Treating Provider/Extender:Macala Baldonado, Lamar Sprinkles in Treatment: 9 Total Contact Cast Applied for Wound Assessment: Wound #9 Right,Medial Foot Performed By: Physician [name], MD Post Procedure Diagnosis Same as Pre-procedure Electronic Signature(s) Signed: 04/04/2019 4:55:35 PM By: Shawn Stall Signed: 04/04/2019 7:02:26 PM By: Baltazar Najjar MD Entered By: Shawn Stall on 04/04/2019 13:40:26 -------------------------------------------------------------------------------- SuperBill Details Patient Name: Date of Service: [name] 04/04/2019 Medical Record [id number] Patient Account Number: [id number] Date of Birth/Sex: Treating RN: Apr 06, 1955 (63 y.o. 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

## 2019-04-04 NOTE — Progress Notes (Signed)
CHARNITA, TRUDEL ([id number]) Visit Report for 04/04/2019 Arrival Information Details Patient Name: Date of Service: [name], [name] 04/04/2019 12:30 PM Medical Record [id number] Patient Account Number: [id number] Date of Birth/Sex: Treating RN: February 20, 1955 (64 y.o. F) Dwiggins, Shannon Primary Care Jaymason Ledesma: Dewayne Hatch Other Clinician: Referring Myriam Brandhorst: Treating Myron Stankovich/Extender:Robson, [name], [facility] Weeks in Treatment: 9 Visit Information History Since Last Visit Added or deleted any medications: No Patient Arrived: Crutches Any new allergies or adverse reactions: No Arrival Time: 12:38 Had a fall or experienced change in No Accompanied By: family activities of daily living that may affect member risk of falls: Transfer Assistance: None Signs or symptoms of abuse/neglect since No Patient Identification Verified: Yes last visito Secondary Verification Process Yes Hospitalized since last visit: No Completed: Implantable device outside of the clinic No Patient Requires Transmission-Based No excluding Precautions: cellular tissue based products placed in Patient Has Alerts: No the center since last visit: Has Dressing in Place as Prescribed: Yes Has Footwear/Offloading in Place as Yes Prescribed: Right: Total Contact Cast Pain Present Now: No Electronic Signature(s) Signed: 04/04/2019 4:36:06 PM By: Kela Millin Entered By: Kela Millin on 04/04/2019 12:40:07 -------------------------------------------------------------------------------- Encounter Discharge Information Details Patient Name: Date of Service: Sarah Libra. 04/04/2019 12:30 PM Medical Record [id number] Patient Account Number: [id number] Date of Birth/Sex: Treating RN: 04/03/1955 (64 y.o. [name] Primary Care Eydan Chianese: Dewayne Hatch Other Clinician: Referring Treniyah Lynn: Treating Baileigh Modisette/Extender:Robson, [name],  [facility] Weeks in Treatment: 9 Encounter Discharge Information Items Discharge Condition: Stable Ambulatory Status: Crutches Discharge Destination: Home Transportation: Private Auto Accompanied By: self Schedule Follow-up Appointment: Yes Clinical Summary of Care: Electronic Signature(s) Signed: 04/04/2019 4:55:35 PM By: Deon Pilling Entered By: Deon Pilling on 04/04/2019 13:52:20 -------------------------------------------------------------------------------- Lower Extremity Assessment Details Patient Name: Date of Service: [name], [name] 04/04/2019 12:30 PM Medical Record [id number] Patient Account Number: [id number] Date of Birth/Sex: Treating RN: March 29, 1955 (64 y.o. Clearnce Sorrel Primary Care Kymia Simi: Dewayne Hatch Other Clinician: Referring Rosabel Sermeno: Treating Janell Keeling/Extender:Robson, [name], [facility] Weeks in Treatment: 9 Edema Assessment Assessed: [Left: No] [Right: No] Edema: [Left: Yes] [Right: Yes] Calf Left: Right: Point of Measurement: 43 cm From Medial Instep 39 cm 38.5 cm Ankle Left: Right: Point of Measurement: 12 cm From Medial Instep 27 cm 25 cm Vascular Assessment Pulses: Dorsalis Pedis Palpable: [Right:Yes] Electronic Signature(s) Signed: 04/04/2019 4:36:06 PM By: Kela Millin Entered By: Kela Millin on 04/04/2019 12:44:35 -------------------------------------------------------------------------------- Multi Wound Chart Details Patient Name: Date of Service: Sarah Libra. 04/04/2019 12:30 PM Medical Record [id number] Patient Account Number: [id number] Date of Birth/Sex: Treating RN: 1955/03/02 (64 y.o. Helene Shoe, Meta.Reding Primary Care Uva Runkel: Dewayne Hatch Other Clinician: Referring Darral Rishel: Treating Luevenia Mcavoy/Extender:Robson, [name], [facility] Weeks in Treatment: 9 Vital Signs Height(in): 68 Pulse(bpm): 74 Weight(lbs): 265 Blood Pressure(mmHg): 152/55 Body  Mass Index(BMI): 40 Temperature(F): 98 Respiratory 18 Rate(breaths/min): Photos: [10:No Photos] [9:No Photos] [N/A:N/A] Wound Location: [10:Left Foot - Plantar, Lateral Right Foot - Medial] [N/A:N/A] Wounding Event: [10:Blister] [9:Blister] [N/A:N/A] Primary Etiology: [10:Diabetic Wound/Ulcer of the Diabetic Wound/Ulcer of the N/A Lower Extremity] [9:Lower Extremity] Comorbid History: [10:Glaucoma, Chronic sinus Glaucoma, Chronic sinus N/A problems/congestion, Anemia, Sleep Apnea, Hypertension, Type II Diabetes, Rheumatoid Arthritis, Neuropathy] [9:problems/congestion, Anemia, Sleep Apnea, Hypertension, Type II  Diabetes, Rheumatoid Arthritis, Neuropathy] Date Acquired: [10:11/18/2018] [9:06/18/2018] [N/A:N/A] Weeks of Treatment: [10:9] [9:9] [N/A:N/A] Wound Status: [10:Open] [9:Open] [N/A:N/A] Measurements L x W x D 1.4x1.5x0.9 [9:4x3x0.7] [N/A:N/A] (cm) Area (cm) : [10:1.649] [9:9.425] [N/A:N/A] Volume (cm) : [10:1.484] [1:9.379] [N/A:N/A] % Reduction in Area: [10:6.30%] [9:14.30%] [N/A:N/A] % Reduction  in Volume: 43.80% [9:33.30%] [N/A:N/A] Classification: [10:Grade 2] [9:Grade 2] [N/A:N/A] Exudate Amount: [10:Medium] [9:Large] [N/A:N/A] Exudate Type: [10:Serosanguineous] [9:Purulent] [N/A:N/A] Exudate Color: [10:red, brown] [9:yellow, brown, green] [N/A:N/A] Wound Margin: [10:Thickened] [9:Thickened] [N/A:N/A] Granulation Amount: [10:Large (67-100%)] [9:Large (67-100%)] [N/A:N/A] Granulation Quality: [10:Red, Pink] [9:Red, Pink] [N/A:N/A] Necrotic Amount: [10:None Present (0%)] [9:None Present (0%)] [N/A:N/A] Exposed Structures: [10:Fat Layer (Subcutaneous Fat Layer (Subcutaneous N/A Tissue) Exposed: Yes Muscle: Yes Fascia: No Tendon: No Joint: No Bone: No] [9:Tissue) Exposed: Yes Fascia: No Tendon: No Muscle: No Joint: No Bone: No] Epithelialization: [10:Small (1-33%)] [9:Small (1-33%)] [N/A:N/A] Electronic Signature(s) Signed: 04/04/2019 4:55:35 PM By: Shawn Stall Signed:  04/04/2019 7:02:26 PM By: Baltazar Najjar MD Entered By: Baltazar Najjar on 04/04/2019 13:36:12 -------------------------------------------------------------------------------- Multi-Disciplinary Care Plan Details Patient Name: Date of Service: Sarah Jaeger. 04/04/2019 12:30 PM Medical Record [id number] Patient Account Number: [id number] Date of Birth/Sex: Treating RN: 1955/10/23 (64 y.o. [name] Primary Care Ilir Mahrt: Chevis Pretty Other Clinician: Referring Janeece Blok: Treating Brynley Cuddeback/Extender:Robson, [name], [facility] Weeks in Treatment: 9 Active Inactive Wound/Skin Impairment Nursing Diagnoses: Impaired tissue integrity Knowledge deficit related to ulceration/compromised skin integrity Goals: Patient/caregiver will verbalize understanding of skin care regimen Date Initiated: 01/28/2019 Target Resolution Date: 05/16/2019 Goal Status: Active Interventions: Assess patient/caregiver ability to obtain necessary supplies Assess patient/caregiver ability to perform ulcer/skin care regimen upon admission and as needed Assess ulceration(s) every visit Provide education on ulcer and skin care Notes: Electronic Signature(s) Signed: 04/04/2019 4:55:35 PM By: Shawn Stall Entered By: Shawn Stall on 04/04/2019 12:43:37 -------------------------------------------------------------------------------- Pain Assessment Details Patient Name: Date of Service: [name], [name] 04/04/2019 12:30 PM Medical Record [id number] Patient Account Number: [id number] Date of Birth/Sex: Treating RN: [birth year] (64 y.o. [name] Primary Care Ophie Burrowes: Chevis Pretty Other Clinician: Referring Lynsie Mcwatters: Treating Matie Dimaano/Extender:Robson, [name], [facility] Weeks in Treatment: 9 Active Problems Location of Pain Severity and Description of Pain Patient Has Paino No Site Locations Pain Management and Medication Current Pain  Management: Electronic Signature(s) Signed: 04/04/2019 4:36:06 PM By: [name] Entered By: [name] on 04/04/2019 12:44:19 -------------------------------------------------------------------------------- Patient/Caregiver Education Details Patient Name: Sarah Jaeger 3/18/2021andnbsp12:30 Date of Service: PM Medical Record [id number] Number: Patient Account Number: [id number] Date of Birth/Gender: 1955-05-27 (63 y.o. F) Treating RN: Shawn Stall Primary Care Other Clinician: Maryla Morrow, Physician: Humphrey Rolls Physician/Extender: Referring Physician: Rozetta Nunnery in Treatment: 9 Education Assessment Education Provided To: Patient Education Topics Provided Wound/Skin Impairment: Handouts: Skin Care Do's and Dont's Methods: Explain/Verbal Responses: Reinforcements needed Electronic Signature(s) Signed: 04/04/2019 4:55:35 PM By: Shawn Stall Entered By: Shawn Stall on 04/04/2019 12:43:49 -------------------------------------------------------------------------------- Wound Assessment Details Patient Name: Date of Service: [name], [name] 04/04/2019 12:30 PM Medical Record [id number] Patient Account Number: [id number] Date of Birth/Sex: Treating RN: 29-Jul-1955 (63 y.o. [name] Primary Care Saoirse Legere: Chevis Pretty Other Clinician: Referring Simonne Boulos: Treating Filippa Yarbough/Extender:Robson, [name], [facility] Weeks in Treatment: 9 Wound Status Wound Number: 10 Primary Diabetic Wound/Ulcer of the Lower Extremity Etiology: Wound Location: Left Foot - Plantar, Lateral Wound Open Wounding Event: Blister Status: Date Acquired: 11/18/2018 Comorbid Glaucoma, Chronic sinus Weeks Of Treatment: 9 History: problems/congestion, Anemia, Sleep Apnea, Clustered Wound: No Hypertension, Type II Diabetes, Rheumatoid Arthritis, Neuropathy Wound Measurements Length: (cm) 1.4 %  Reductio Width: (cm) 1.5 % Reductio Depth: (cm) 0.9 Epithelial Area: (cm) 1.649 Tunneling Volume: (cm) 1.484 Undermini Wound Description Classification: Grade 2 Foul Odor Wound Margin: Thickened Slough/Fi Exudate Amount: Medium Exudate Type: Serosanguineous Exudate Color: red, brown Wound Bed Granulation Amount: Large (67-100%) Granulation Quality: Red, Pink Fascia  Ex Necrotic Amount: None Present (0%) Fat Layer Tendon Ex Muscle Ex Necr Joint Exp Bone Expo Electronic Signature(s) Signed: 04/04/2019 4:36:06 PM By: [name] Entered By: [name] on 03/1 After Cleansing: No brino No Exposed Structure posed: No (Subcutaneous Tissue) Exposed: Yes posed: No posed: Yes osis of Muscle: No osed: No sed: No 08/2019 12:45:01 n in Area: 6.3% n in Volume: 43.8% ization: Small (1-33%) : No ng: No -------------------------------------------------------------------------------- Wound Assessment Details Patient Name: Date of Service: [name], [name] 04/04/2019 12:30 PM Medical Record [id number] Patient Account Number: [id number] Date of Birth/Sex: Treating RN: [birth year] (64 y.o. [name] Primary Care Beva Remund: Chevis Pretty Other Clinician: Referring Merari Pion: Treating Janneth Krasner/Extender:Robson, [name], [facility] Weeks in Treatment: 9 Wound Status Wound Number: 9 Primary Diabetic Wound/Ulcer of the Lower Extremity Etiology: Wound Location: Right Foot - Medial Wound Open Wounding Event: Blister Status: Date Acquired: 06/18/2018 Comorbid Glaucoma, Chronic sinus Weeks Of Treatment: 9 History: problems/congestion, Anemia, Sleep Apnea, Clustered Wound: No Hypertension, Type II Diabetes, Rheumatoid Arthritis, Neuropathy Wound Measurements Length: (cm) 4 Width: (cm) 3 Depth: (cm) 0.7 Area: (cm) 9.425 Volume: (cm) 6.597 Wound Description Classification: Grade 2 Wound Margin: Thickened Exudate Amount:  Large Exudate Type: Purulent Exudate Color: yellow, brown, green Wound Bed Granulation Amount: Large (67-100%) Granulation Quality: Red, Pink Necrotic Amount: None Present (0%) After Cleansing: No rino No Exposed Structure osed: No (Subcutaneous Tissue) Exposed: Yes osed: No osed: No sed: No ed: No % Reduction in Area: 14.3% % Reduction in Volume: 33.3% Epithelialization: Small (1-33%) Tunneling: No Undermining: No Foul Odor Slough/Fib Fascia Exp Fat Layer Tendon Exp Muscle Exp Joint Expo Bone Expos Treatment Notes Wound #9 (Right, Medial Foot) 1. Cleanse With Wound Cleanser Soap and water 3. Primary Dressing Applied Hydrofera Blue 4. Secondary Dressing ABD Pad Roll Gauze Kerramax/Xtrasorb 5. Secured With Medipore tape Notes carboflex. TCC applied by MD. Electronic Signature(s) Signed: 04/04/2019 4:36:06 PM By: [name] Entered By: [name] on 04/04/2019 12:45:16 -------------------------------------------------------------------------------- Vitals Details Patient Name: Date of Service: Sarah Jaeger. 04/04/2019 12:30 PM Medical Record [id number] Patient Account Number: [id number] Date of Birth/Sex: Treating RN: August 29, 1955 (63 y.o. [name] Primary Care Aella Ronda: Chevis Pretty Other Clinician: Referring Dillen Belmontes: Treating Hayden Mabin/Extender:Robson, [name], [facility] Weeks in Treatment: 9 Vital Signs Time Taken: 12:43 Temperature (F): 98 Height (in): 68 Pulse (bpm): 74 Weight (lbs): 265 Respiratory Rate (breaths/min): 18 Body Mass Index (BMI): 40.3 Blood Pressure (mmHg): 152/55 Reference Range: 80 - 120 mg / dl Electronic Signature(s) Signed: 04/04/2019 4:36:06 PM By: [name] Entered By: [name] on 04/04/2019 12:44:04

## 2019-04-05 ENCOUNTER — Other Ambulatory Visit: Payer: Self-pay | Admitting: Internal Medicine

## 2019-04-05 DIAGNOSIS — I10 Essential (primary) hypertension: Secondary | ICD-10-CM

## 2019-04-08 ENCOUNTER — Encounter (HOSPITAL_BASED_OUTPATIENT_CLINIC_OR_DEPARTMENT_OTHER): Payer: Medicare Other | Admitting: Internal Medicine

## 2019-04-08 ENCOUNTER — Other Ambulatory Visit: Payer: Self-pay

## 2019-04-08 ENCOUNTER — Encounter: Payer: Self-pay | Admitting: Internal Medicine

## 2019-04-08 DIAGNOSIS — E11621 Type 2 diabetes mellitus with foot ulcer: Secondary | ICD-10-CM | POA: Diagnosis not present

## 2019-04-08 NOTE — Progress Notes (Signed)
[place] ([id number]) Visit Report for 04/08/2019 Debridement Details Patient Name: Date of Service: [name], [name] 04/08/2019 12:30 PM Medical Record [id number] Patient Account Number: [id number] Date of Birth/Sex: Treating RN: 11/04/1955 (64 y.o. [name] Primary Care Provider: Chevis Pretty Other Clinician: Referring Provider: Chevis Pretty Treating Provider/Extender:Ekam Besson, Lamar Sprinkles in Treatment: 10 Debridement Performed for Wound #9 Right,Medial Foot Assessment: Performed By: Physician [name], MD Debridement Type: Debridement Severity of Tissue Pre Fat layer exposed Debridement: Level of Consciousness (Pre- Awake and Alert procedure): Pre-procedure Verification/Time Out Taken: Yes - 13:35 Start Time: 13:35 Total Area Debrided (L x W): 3.7 (cm) x 3 (cm) = 11.1 (cm) Tissue and other material Viable, Non-Viable, Callus, Subcutaneous debrided: Level: Skin/Subcutaneous Tissue Debridement Description: Excisional Instrument: Blade Bleeding: Moderate Hemostasis Achieved: Silver Nitrate End Time: 13:37 Procedural Pain: 0 Post Procedural Pain: 0 Response to Treatment: Procedure was tolerated well Level of Consciousness Awake and Alert (Post-procedure): Post Debridement Measurements of Total Wound Length: (cm) 3.7 Width: (cm) 3 Depth: (cm) 0.3 Volume: (cm) 2.615 Character of Wound/Ulcer Post Improved Debridement: Severity of Tissue Post Debridement: Fat layer exposed Post Procedure Diagnosis Same as Pre-procedure Electronic Signature(s) Signed: 04/08/2019 5:18:21 PM By: Zandra Abts RN, BSN Signed: 04/08/2019 5:34:39 PM By: Baltazar Najjar MD Entered By: Zandra Abts on 04/08/2019 14:02:46 -------------------------------------------------------------------------------- HPI Details Patient Name: Date of Service: Sarah Jaeger. 04/08/2019 12:30 PM Medical Record [id number] Patient Account Number:  [id number] Date of Birth/Sex: Treating RN: [birth year] (64 y.o. [name] Primary Care Provider: Chevis Pretty Other Clinician: Referring Provider: Treating Provider/Extender:Aquita Simmering, [name], [facility] Weeks in Treatment: 10 History of Present Illness HPI Description: 64 yrs old bf here for follow up recurrent left charcot foot ulcer. She has DM. She is not a smoker. She states a blister developed 3 months ago at site of previous breakdown from 1 year ago. It was debrided at the podiatrist's office. She went to the ER and then sent here. She denies pain. Xray was negative for osteo. Culture from this clinic negative. 09/08/14 [name] to [name] for Charcot foot. Seen by him and MRI scheduled 09/19/14. Prior silver collagen but this was not ordered last visit, patient has been rinsing with saline alone. Re institute silver collagen every other day. F/u 2 weeks with [name] as Labor Day holiday. Supplies ordered. 09/25/14; this is a patient with a Charcot foot on the left. she has a wound over the plantar aspect of her left calcaneus. She has been seen by [name] of orthopedic surgery and has had an MRI. She is apparently going within the next week or 2 for corrective surgery which will involve an external fixator. I don't have any of the details of this. 10/06/14; The patient is a 64 yrs old bf with a left Charcot foot and ulcer on the plantar. 12/01/14 Returns post surgery with [name]. Has follow up visit later this week with him, currently in facility and NWB over this extremity. States no drainage from foot and no current wound care. On exam callus and scab in place, suspect healed. Has external fixator in place. Will defer to [name] for management, ok to place moisturizing cream over scabs and callus and she will call if she requires follow up here. READMISSION 01/28/2019 Patient was in the clinic in 2016 for a wound on the plantar  aspect of her left calcaneus. Predominantly cared for by [name] she was referred to [name] and had corrective  surgery for the position of her left ankle I believe. She had previously been here in 2015 and then prior to that in 2011 2012 that I do not have these records. More recently she has developed fairly extensive wounds on the right medial foot, left lateral foot and a small area on the right medial great toe. Right medial foot wound has been there for 6 to 7 months, left foot wound for 2 to 3 months and the right great toe only over the last few weeks. She has been followed by [name] at Dr. Laverta [place] office it sounds as though she has been undergoing callus paring and silver nitrate. The patient has been washing these off with soap and water and plan applying dry dressing Past medical history, type 2 diabetes well controlled with a recent hemoglobin A1c of 7 on 11/16, type 2 diabetes with peripheral neuropathy, previous left Charcot joint surgery bilateral Charcot joints currently, stage III chronic renal failure, hypertension, obstructive sleep apnea, history of MRSA and gastroesophageal reflux. 1/18; this is a patient with bilateral Charcot feet. Plain x-rays that I did of both of these wounds that are either at bone or precariously close to bone were done. On the right foot this showed possible lytic destruction involving the anterior aspect of the talus which may represent a wound osteomyelitis. An MRI was recommended. On the left there was no definite lytic destruction although the wound is deep undermining's and I think probably requires an MRI on this basis in any case 1/25; patient was supposed to have her MRI last Friday of her bilateral feet however they would not do it because there was silver alginate in her dressings, will replace with calcium alginate today and will see if we can get her done today 2/1; patient did not get her MRIs done last week because of  issues with the MRI department receiving orders to do bilateral feet. She has 2 deep wounds in the setting of Charcot feet deformities bilaterally. Both of them at one point had exposed bone although I had trouble demonstrating that today. I am not sure that we can offload these very aggressively as I watched her leave the clinic last week her gait is very narrow and unsteady. 2/15; the MRI of her right foot did not show osteomyelitis potentially osteonecrosis of the talus. 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 The wounds are a large wound on the right medial foot and on the left a substantial wound on the left lateral plantar calcaneus. We have been using silver alginate. Ultimately I think we need to consider a total contact cast on the right while we work through the possibility of osteomyelitis in the left. I watched her walk out with her crutches I have some trepidation about the ability to walk with the cast on her right foot. Nevertheless with her Charcot deformities I do not think there is a way to heal these short of offloading them aggressively. 2/22; the culture of the bone in the left foot that I did last week showed a few Citrobacter Koseri as well as some streptococcal species. In my attempted bone for pathology did not actually yield a lot of bone the pathologist did not identify any osteomyelitis. 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 Is promised today and  aggressive debridement of the right foot and a total contact cast which surprisingly she seemed to tolerate quite well 03/13/2019 upon evaluation today patient appears to be doing well with her total contact cast she is here for the first cast change. She had no areas of rubbing or any other complications at this point. Overall  things are doing quite well. 2/26; the patient was kindly seen by Dr. Orvan Falconer of infectious disease on 2/25. She is now on IV vancomycin PICC line placement in 2 days and oral Flagyl. This was in response to her MRI showing osteomyelitis of the left calcaneus concerning for osteomyelitis. We have been putting her right leg in a total contact cast. Our nurses report drainage. She is tolerating the total contact cast well. 3/4; the patient is started her IV antibiotics and is taking IV vancomycin and oral Flagyl. She appears to be tolerating these both well. This is for osteomyelitis involving the left calcaneus. The area on the right in the setting of bilateral Charcot deformities did not have any bone infection on MRI. We put a total contact cast on her with silver alginate as the primary dressing and she returns today in follow-up. Per our intake nurse there was too much drainage to leave this on all week therefore we will bring her back for an early change 3/8; follow-up total contact cast she is still having a lot of drainage probably too much drainage from the right foot wound will week with the same cast. She will be back on Thursday. The area on the left looks somewhat better 3/11; patient here for a total contact cast change. 3/15; patient came in for wound care evaluation. She is still on IV vancomycin and oral Flagyl for osteomyelitis in the left foot. The area on the right foot we are putting in a total contact cast. 3/18 for total contact cast change on the right foot 3/22; the patient was here for wound review today. The area on the right foot is slightly smaller in terms of surface area. However the surface of the wound is very gritty and hyper granulated. More problematically the area on the plantar left foot has increased depth indeed in the center of this it probes to bone. We have been using Hydrofera Blue in both areas. She is on antibiotics as prescribed by infectious disease  IV vancomycin and oral Flagyl Electronic Signature(s) Signed: 04/08/2019 5:34:39 PM By: Baltazar Najjar MD Entered By: Baltazar Najjar on 04/08/2019 14:01:33 -------------------------------------------------------------------------------- Physical Exam Details Patient Name: Date of Service: Sarah Jaeger. 04/08/2019 12:30 PM Medical Record [id number] Patient Account Number: [id number] Date of Birth/Sex: Treating RN: [birth year] (64 y.o. [name] Primary Care Provider: Chevis Pretty Other Clinician: Referring Provider: Treating Provider/Extender:Sundae Maners, [name], [facility] Weeks in Treatment: 10 Constitutional Patient is hypertensive.. Pulse regular and within target range for patient.Marland Kitchen Respirations regular, non-labored and within target range.. Temperature is normal and within the target range for the patient.Marland Kitchen Appears in no distress. Notes Wound exam; the area on the right foot was debrided with a #10 scalpel removing callus subcutaneous tissue from the circumference and a nonviable hyper granulated fibrinous surface to the wound. Hemostasis with silver nitrate a pressure dressing On the left foot the wound had increased in depth per our intake nurse. Careful examination of this area revealed the tip of this "valley shaped" wound area to probe directly to bone. There is no purulent drainage no surrounding erythema. Electronic Signature(s) Signed: 04/08/2019 5:34:39 PM By: Baltazar Najjar MD  Entered By: Linton Ham on 04/08/2019 14:03:25 -------------------------------------------------------------------------------- Physician Orders Details Patient Name: Date of Service: [name], [name] 04/08/2019 12:30 PM Medical Record [id number] Patient Account Number: [id number] Date of Birth/Sex: Treating RN: 03/27/1955 (64 y.o. [name] Primary Care Provider: Dewayne Hatch Other Clinician: Referring Provider: Treating  Provider/Extender:Aviel Davalos, [name], [facility] Weeks in Treatment: 10 Verbal / Phone Orders: No Diagnosis Coding ICD-10 Coding Code Description E11.621 Type 2 diabetes mellitus with foot ulcer L97.524 Non-pressure chronic ulcer of other part of left foot with necrosis of bone L97.511 Non-pressure chronic ulcer of other part of right foot limited to breakdown of skin L97.514 Non-pressure chronic ulcer of other part of right foot with necrosis of bone E11.42 Type 2 diabetes mellitus with diabetic polyneuropathy M14.671 Charcot's joint, right ankle and foot M14.672 Charcot's joint, left ankle and foot Follow-up Appointments Return Appointment in 1 week. - Monday Return Appointment in: - Thursday for cast change Dressing Change Frequency Wound #10 Left,Lateral,Plantar Foot Change Dressing every other day. Wound #9 Right,Medial Foot Do not change entire dressing for one week. Wound Cleansing May shower with protection. Primary Wound Dressing Wound #10 Left,Lateral,Plantar Foot Calcium Alginate with Silver - lightly pack Wound #9 Right,Medial Foot Hydrofera Blue Secondary Dressing Wound #10 Left,Lateral,Plantar Foot Kerlix/Rolled Gauze Dry Gauze Other: - felt callous pad Wound #9 Right,Medial Foot Kerlix/Rolled Gauze Zetuvit or Kerramax Carboflex Off-Loading Total Contact Cast to Right Lower Extremity Open toe surgical shoe to: - add felt to sandal also Electronic Signature(s) Signed: 04/08/2019 5:18:21 PM By: Levan Hurst RN, BSN Signed: 04/08/2019 5:34:39 PM By: Linton Ham MD Entered By: Levan Hurst on 04/08/2019 13:43:02 -------------------------------------------------------------------------------- Problem List Details Patient Name: Date of Service: Sarah Libra. 04/08/2019 12:30 PM Medical Record [id number] Patient Account Number: [id number] Date of Birth/Sex: Treating RN: 11-06-1955 (64 y.o. [name] Primary Care Provider:  Dewayne Hatch Other Clinician: Referring Provider: Treating Provider/Extender:Riddik Senna, [name], [facility] Weeks in Treatment: 10 Active Problems ICD-10 Evaluated Encounter Code Description Active Date Today Diagnosis E11.621 Type 2 diabetes mellitus with foot ulcer 01/28/2019 No Yes L97.524 Non-pressure chronic ulcer of other part of left foot 01/28/2019 No Yes with necrosis of bone L97.511 Non-pressure chronic ulcer of other part of right foot 01/28/2019 No Yes limited to breakdown of skin L97.514 Non-pressure chronic ulcer of other part of right foot 01/28/2019 No Yes with necrosis of bone E11.42 Type 2 diabetes mellitus with diabetic polyneuropathy 01/28/2019 No Yes M14.671 Charcot's joint, right ankle and foot 01/28/2019 No Yes M14.672 Charcot's joint, left ankle and foot 01/28/2019 No Yes 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 04/08/2019 12:30 PM Medical Record [id number] Patient Account Number: [id number] Date of Birth/Sex: Treating RN: 04-18-1955 (64 y.o. [name] Primary Care Provider: Dewayne Hatch Other Clinician: Referring Provider: Treating Provider/Extender:Ahonesty Woodfin, [name], [facility] Weeks in Treatment: 10 Subjective History of Present Illness (HPI) 64 yrs old bf here for follow up recurrent left charcot foot ulcer. She has DM. She is not a smoker. She states a blister developed 3 months ago at site of previous breakdown from 1 year ago. It was debrided at the podiatrist's office. She went to the ER and then sent here. She denies pain. Xray was negative for osteo. Culture  from this clinic negative. 09/08/14 Referred by [name] to  [name] for Charcot foot. Seen by him and MRI scheduled 09/19/14. Prior silver collagen but this was not ordered last visit, patient has been rinsing with saline alone. Re institute silver collagen every other day. F/u 2 weeks with [name] as Labor Day holiday. Supplies ordered. 09/25/14; this is a patient with a Charcot foot on the left. she has a wound over the plantar aspect of her left calcaneus. She has been seen by [name] of orthopedic surgery and has had an MRI. She is apparently going within the next week or 2 for corrective surgery which will involve an external fixator. I don't have any of the details of this. 10/06/14; The patient is a 64 yrs old bf with a left Charcot foot and ulcer on the plantar. 12/01/14 Returns post surgery with [name]. Has follow up visit later this week with him, currently in facility and NWB over this extremity. States no drainage from foot and no current wound care. On exam callus and scab in place, suspect healed. Has external fixator in place. Will defer to [name] for management, ok to place moisturizing cream over scabs and callus and she will call if she requires follow up here. READMISSION 01/28/2019 Patient was in the clinic in 2016 for a wound on the plantar aspect of her left calcaneus. Predominantly cared for by [name] she was referred to [name] and had corrective surgery for the position of her left ankle I believe. She had previously been here in 2015 and then prior to that in 2011 2012 that I do not have these records. More recently she has developed fairly extensive wounds on the right medial foot, left lateral foot and a small area on the right medial great toe. Right medial foot wound has been there for 6 to 7 months, left foot wound for 2 to 3 months and the right great toe only over the last few weeks. She has been followed by [name] at Dr. Laverta [place]  office it sounds as though she has been undergoing callus paring and silver nitrate. The patient has been washing these off with soap and water and plan applying dry dressing Past medical history, type 2 diabetes well controlled with a recent hemoglobin A1c of 7 on 11/16, type 2 diabetes with peripheral neuropathy, previous left Charcot joint surgery bilateral Charcot joints currently, stage III chronic renal failure, hypertension, obstructive sleep apnea, history of MRSA and gastroesophageal reflux. 1/18; this is a patient with bilateral Charcot feet. Plain x-rays that I did of both of these wounds that are either at bone or precariously close to bone were done. On the right foot this showed possible lytic destruction involving the anterior aspect of the talus which may represent a wound osteomyelitis. An MRI was recommended. On the left there was no definite lytic destruction although the wound is deep undermining's and I think probably requires an MRI on this basis in any case 1/25; patient was supposed to have her MRI last Friday of her bilateral feet however they would not do it because there was silver alginate in her dressings, will replace with calcium alginate today and will see if we can get her done today 2/1; patient did not get her MRIs done last week because of issues with the MRI department receiving orders to do bilateral feet. She has 2 deep wounds in the setting of Charcot feet deformities bilaterally. Both of them at one point had exposed bone although I had trouble demonstrating that  today. I am not sure that we can offload these very aggressively as I watched her leave the clinic last week her gait is very narrow and unsteady. 2/15; the MRI of her right foot did not show osteomyelitis potentially osteonecrosis of the talus. 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 The wounds are a large wound on the right medial foot and on the  left a substantial wound on the left lateral plantar calcaneus. We have been using silver alginate. Ultimately I think we need to consider a total contact cast on the right while we work through the possibility of osteomyelitis in the left. I watched her walk out with her crutches I have some trepidation about the ability to walk with the cast on her right foot. Nevertheless with her Charcot deformities I do not think there is a way to heal these short of offloading them aggressively. 2/22; the culture of the bone in the left foot that I did last week showed a few Citrobacter Koseri as well as some streptococcal species. In my attempted bone for pathology did not actually yield a lot of bone the pathologist did not identify any osteomyelitis. 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 Is promised today and aggressive debridement of the right foot and a total contact cast which surprisingly she seemed to tolerate quite well 03/13/2019 upon evaluation today patient appears to be doing well with her total contact cast she is here for the first cast change. She had no areas of rubbing or any other complications at this point. Overall things are doing quite well. 2/26; the patient was kindly seen by Dr. Orvan Falconer of infectious disease on 2/25. She is now on IV vancomycin PICC line placement in 2 days and oral Flagyl. This was in response to her MRI showing osteomyelitis of the left calcaneus concerning for osteomyelitis. We have been putting her right leg in a total contact cast. Our nurses report drainage. She is tolerating the total contact cast well. 3/4; the patient is started her IV antibiotics and is taking IV vancomycin and oral Flagyl. She appears to be tolerating these both well. This is for osteomyelitis involving the left calcaneus. The area on  the right in the setting of bilateral Charcot deformities did not have any bone infection on MRI. We put a total contact cast on her with silver alginate as the primary dressing and she returns today in follow-up. Per our intake nurse there was too much drainage to leave this on all week therefore we will bring her back for an early change 3/8; follow-up total contact cast she is still having a lot of drainage probably too much drainage from the right foot wound will week with the same cast. She will be back on Thursday. The area on the left looks somewhat better 3/11; patient here for a total contact cast change. 3/15; patient came in for wound care evaluation. She is still on IV vancomycin and oral Flagyl for osteomyelitis in the left foot. The area on the right foot we are putting in a total contact cast. 3/18 for total contact cast change on the right foot 3/22; the patient was here for wound review today. The area on the right foot is slightly smaller in terms of surface area. However the surface of the wound  is very gritty and hyper granulated. More problematically the area on the plantar left foot has increased depth indeed in the center of this it probes to bone. We have been using Hydrofera Blue in both areas. She is on antibiotics as prescribed by infectious disease IV vancomycin and oral Flagyl Objective Constitutional Patient is hypertensive.. Pulse regular and within target range for patient.Marland Kitchen Respirations regular, non-labored and within target range.. Temperature is normal and within the target range for the patient.Marland Kitchen Appears in no distress. Vitals Time Taken: 12:59 PM, Height: 68 in, Weight: 265 lbs, BMI: 40.3, Temperature: 98.2 F, Pulse: 75 bpm, Respiratory Rate: 18 breaths/min, Blood Pressure: 158/72 mmHg. General Notes: Wound exam; the area on the right foot was debrided with a #10 scalpel removing callus subcutaneous tissue from the circumference and a nonviable hyper  granulated fibrinous surface to the wound. Hemostasis with silver nitrate a pressure dressing ooOn the left foot the wound had increased in depth per our intake nurse. Careful examination of this area revealed the tip of this "valley shaped" wound area to probe directly to bone. There is no purulent drainage no surrounding erythema. Integumentary (Hair, Skin) Wound #10 status is Open. Original cause of wound was Blister. The wound is located on the Left,Lateral,Plantar Foot. The wound measures 1.5cm length x 1.6cm width x 1.4cm depth; 1.885cm^2 area and 2.639cm^3 volume. There is bone, muscle, and Fat Layer (Subcutaneous Tissue) Exposed exposed. There is no tunneling or undermining noted. There is a medium amount of serosanguineous drainage noted. The wound margin is thickened. There is large (67-100%) red, pink granulation within the wound bed. There is no necrotic tissue within the wound bed. General Notes: callus to periwound. Wound #9 status is Open. Original cause of wound was Blister. The wound is located on the Right,Medial Foot. The wound measures 3.7cm length x 3cm width x 0.3cm depth; 8.718cm^2 area and 2.615cm^3 volume. There is Fat Layer (Subcutaneous Tissue) Exposed exposed. There is no tunneling noted, however, there is undermining starting at 12:00 and ending at 4:00 with a maximum distance of 0.4cm. There is a large amount of purulent drainage noted. The wound margin is thickened. There is large (67-100%) red, pink, hyper - granulation within the wound bed. There is no necrotic tissue within the wound bed. 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 There was a Excisional Skin/Subcutaneous Tissue Debridement with a total area of 11.1 sq cm performed by [name], MD. With the following instrument(s): Blade to remove Viable and Non-Viable tissue/material. Material removed includes Callus and Subcutaneous Tissue and. No specimens were taken. A time out was conducted at 13:35, prior to the start of the procedure. A Moderate amount of bleeding was controlled with Silver Nitrate. The procedure was tolerated well with a pain level of 0 throughout and a pain level of 0 following the procedure. Post Debridement Measurements: 3.7cm length x 3cm width x 0.3cm depth; 2.615cm^3  volume. Character of Wound/Ulcer Post Debridement is improved. Severity of Tissue Post Debridement is: Fat layer exposed. Post procedure Diagnosis Wound #9: Same as Pre-Procedure Pre-procedure diagnosis of Wound #9 is a Diabetic Wound/Ulcer of the Lower Extremity located on the Right,Medial Foot . There was a Total Contact Cast Procedure by [name], MD. Post procedure Diagnosis Wound #9: Same as Pre-Procedure Plan Follow-up Appointments: Return Appointment in 1 week. - Monday Return Appointment in: - Thursday for cast change Dressing Change Frequency: Wound #10 Left,Lateral,Plantar Foot: Change Dressing every other day. Wound #9 Right,Medial Foot: Do not change entire dressing for one week. Wound Cleansing: May shower with protection. Primary Wound Dressing: Wound #10 Left,Lateral,Plantar Foot: Calcium Alginate with Silver - lightly pack Wound #9 Right,Medial Foot: Hydrofera Blue Secondary Dressing: Wound #10  Left,Lateral,Plantar Foot: Kerlix/Rolled Gauze Dry Gauze Other: - felt callous pad Wound #9 Right,Medial Foot: Kerlix/Rolled Gauze Zetuvit or Kerramax Carboflex Off-Loading: Total Contact Cast to Right Lower Extremity Open toe surgical shoe to: - add felt to sandal also 1. I am continue with Hydrofera Blue to the right foot 2. I changed to silver alginate to the left foot 3. We are going to continue the total contact cast on the right. We have not been also offloading the left foot at all she is wearing a regular running shoe. We are going to try to transition her into a surgical shoe with some form of felt offloading Electronic Signature(s) Signed: 04/08/2019 5:34:39 PM By: Baltazar Najjar MD Entered By: Baltazar Najjar on 04/08/2019 14:05:15 -------------------------------------------------------------------------------- Total Contact Cast Details Patient Name: Date of Service: [name], [name] 04/08/2019 12:30 PM Medical Record [id number] Patient Account Number: [id number] Date of Birth/Sex: Treating RN: September 07, 1955 (64 y.o. [name] Primary Care Provider: Chevis Pretty Other Clinician: Referring Provider: Chevis Pretty Treating Provider/Extender:Dheeraj Hail, Lamar Sprinkles in Treatment: 10 Total Contact Cast Applied for Wound Assessment: Wound #9 Right,Medial Foot Performed By: Physician [name], MD Post Procedure Diagnosis Same as Pre-procedure Electronic Signature(s) Signed: 04/08/2019 5:34:39 PM By: Baltazar Najjar MD Entered By: Baltazar Najjar on 04/08/2019 13:59:54 -------------------------------------------------------------------------------- SuperBill Details Patient Name: Date of Service: Sarah Jaeger 04/08/2019 Medical Record [id number] Patient Account Number: [id number] Date of Birth/Sex: Treating RN: Apr 22, 1955 (63 y.o. [name] Primary Care Provider: Chevis Pretty Other Clinician: Referring  Provider: Treating Provider/Extender:Emrys Mckamie, [name], [facility] Weeks in Treatment: 10 Diagnosis Coding ICD-10 Codes Code Description E11.621 Type 2 diabetes mellitus with foot ulcer L97.524 Non-pressure chronic ulcer of other part of left foot with necrosis of bone L97.511 Non-pressure chronic ulcer of other part of right foot limited to breakdown of skin L97.514 Non-pressure chronic ulcer of other part of right foot with necrosis of bone E11.42 Type 2 diabetes mellitus with diabetic polyneuropathy M14.671 Charcot's joint, right ankle and foot M14.672 Charcot's joint, left ankle and foot 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, left ankle and foot Facility Procedures CPT4 Code Description: [id number] 11042 - DEB SUBQ TISSUE 20 SQ CM/< ICD-10 Diagnosis Description L97.511 Non-pressure chronic ulcer of other part of right foot limi Modifier: ted to breakdo Quantity: 1 wn of skin Physician Procedures CPT4 Code Description: [id number] 11042 - WC PHYS SUBQ TISS 20 SQ CM ICD-10 Diagnosis Description L97.511 Non-pressure chronic ulcer of other part of right foot limi Modifier: ted to breakdo Quantity: 1 wn of skin Electronic Signature(s) Signed: 04/08/2019 5:34:39 PM By: Baltazar Najjar MD Entered By: Baltazar Najjar on 04/08/2019 14:05:40

## 2019-04-11 ENCOUNTER — Encounter (HOSPITAL_BASED_OUTPATIENT_CLINIC_OR_DEPARTMENT_OTHER): Payer: Medicare Other | Admitting: Internal Medicine

## 2019-04-11 ENCOUNTER — Other Ambulatory Visit: Payer: Self-pay

## 2019-04-11 DIAGNOSIS — E11621 Type 2 diabetes mellitus with foot ulcer: Secondary | ICD-10-CM | POA: Diagnosis not present

## 2019-04-11 NOTE — Progress Notes (Signed)
[name], [name] ([id number]) Visit Report for 04/11/2019 HPI Details Patient Name: Date of Service: [name], [name] 04/11/2019 1:30 PM Medical Record [id number] Patient Account Number: [id number] Date of Birth/Sex: Treating RN: February 19, 1955 (65 y.o. F) Primary Care Provider: Chevis Pretty Other Clinician: Referring Provider: Treating Provider/Extender:Myosha Cuadras, [name], [facility] Weeks in Treatment: 10 History of Present Illness HPI Description: 64 yrs old bf here for follow up recurrent left charcot foot ulcer. She has DM. She is not a smoker. She states a blister developed 3 months ago at site of previous breakdown from 1 year ago. It was debrided at the podiatrist's office. She went to the ER and then sent here. She denies pain. Xray was negative for osteo. Culture from this clinic negative. 09/08/[name] to [name] for Charcot foot. Seen by him and MRI scheduled 09/19/14. Prior silver collagen but this was not ordered last visit, patient has been rinsing with saline alone. Re institute silver collagen every other day. F/u 2 weeks with [name] as Labor Day holiday. Supplies ordered. 09/25/14; this is a patient with a Charcot foot on the left. she has a wound over the plantar aspect of her left calcaneus. She has been seen by [name] of orthopedic surgery and has had an MRI. She is apparently going within the next week or 2 for corrective surgery which will involve an external fixator. I don't have any of the details of this. 10/06/14; The patient is a 64 yrs old bf with a left Charcot foot and ulcer on the plantar. 12/01/14 Returns post surgery with [name]. Has follow up visit later this week with him, currently in facility and NWB over this extremity. States no drainage from foot and no current wound care. On exam callus and scab in place, suspect healed. Has external fixator in place. Will defer to [name] for management, ok to  place moisturizing cream over scabs and callus and she will call if she requires follow up here. READMISSION 01/28/2019 Patient was in the clinic in 2016 for a wound on the plantar aspect of her left calcaneus. Predominantly cared for by [name] she was referred to [name] and had corrective surgery for the position of her left ankle I believe. She had previously been here in 2015 and then prior to that in 2011 2012 that I do not have these records. More recently she has developed fairly extensive wounds on the right medial foot, left lateral foot and a small area on the right medial great toe. Right medial foot wound has been there for 6 to 7 months, left foot wound for 2 to 3 months and the right great toe only over the last few weeks. She has been followed by [name] at Dr. Laverta [place] office it sounds as though she has been undergoing callus paring and silver nitrate. The patient has been washing these off with soap and water and plan applying dry dressing Past medical history, type 2 diabetes well controlled with a recent hemoglobin A1c of 7 on 11/16, type 2 diabetes with peripheral neuropathy, previous left Charcot joint surgery bilateral Charcot joints currently, stage III chronic renal failure, hypertension, obstructive sleep apnea, history of MRSA and gastroesophageal reflux. 1/18; this is a patient with bilateral Charcot feet. Plain x-rays that I did of both of these wounds that are either at bone or precariously close to bone were done. On the right foot this showed possible lytic destruction involving the anterior aspect of the  talus which may represent a wound osteomyelitis. An MRI was recommended. On the left there was no definite lytic destruction although the wound is deep undermining's and I think probably requires an MRI on this basis in any case 1/25; patient was supposed to have her MRI last Friday of her bilateral feet however they would not do it because there was  silver alginate in her dressings, will replace with calcium alginate today and will see if we can get her done today 2/1; patient did not get her MRIs done last week because of issues with the MRI department receiving orders to do bilateral feet. She has 2 deep wounds in the setting of Charcot feet deformities bilaterally. Both of them at one point had exposed bone although I had trouble demonstrating that today. I am not sure that we can offload these very aggressively as I watched her leave the clinic last week her gait is very narrow and unsteady. 2/15; the MRI of her right foot did not show osteomyelitis potentially osteonecrosis of the talus. 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 The wounds are a large wound on the right medial foot and on the left a substantial wound on the left lateral plantar calcaneus. We have been using silver alginate. Ultimately I think we need to consider a total contact cast on the right while we work through the possibility of osteomyelitis in the left. I watched her walk out with her crutches I have some trepidation about the ability to walk with the cast on her right foot. Nevertheless with her Charcot deformities I do not think there is a way to heal these short of offloading them aggressively. 2/22; the culture of the bone in the left foot that I did last week showed a few Citrobacter Koseri as well as some streptococcal species. In my attempted bone for pathology did not actually yield a lot of bone the pathologist did not identify any osteomyelitis. 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 Is promised today and aggressive debridement of the right foot and a total contact cast which surprisingly she seemed to tolerate quite well 03/13/2019 upon evaluation today  patient appears to be doing well with her total contact cast she is here for the first cast change. She had no areas of rubbing or any other complications at this point. Overall things are doing quite well. 2/26; the patient was kindly seen by Dr. Orvan Falconer of infectious disease on 2/25. She is now on IV vancomycin PICC line placement in 2 days and oral Flagyl. This was in response to her MRI showing osteomyelitis of the left calcaneus concerning for osteomyelitis. We have been putting her right leg in a total contact cast. Our nurses report drainage. She is tolerating the total contact cast well. 3/4; the patient is started her IV antibiotics and is taking IV vancomycin and oral Flagyl. She appears to be tolerating these both well. This is for osteomyelitis involving the left calcaneus. The area on the right in the setting of bilateral Charcot deformities did not have any bone infection on MRI. We put a total contact cast on her with silver alginate as the primary dressing and she returns today in follow-up. Per our intake nurse there was too much drainage to leave this on all week therefore we will  bring her back for an early change 3/8; follow-up total contact cast she is still having a lot of drainage probably too much drainage from the right foot wound will week with the same cast. She will be back on Thursday. The area on the left looks somewhat better 3/11; patient here for a total contact cast change. 3/15; patient came in for wound care evaluation. She is still on IV vancomycin and oral Flagyl for osteomyelitis in the left foot. The area on the right foot we are putting in a total contact cast. 3/18 for total contact cast change on the right foot 3/22; the patient was here for wound review today. The area on the right foot is slightly smaller in terms of surface area. However the surface of the wound is very gritty and hyper granulated. More problematically the area on the plantar left  foot has increased depth indeed in the [name] of this it probes to bone. We have been using Hydrofera Blue in both areas. She is on antibiotics as prescribed by infectious disease IV vancomycin and oral Flagyl 3/25; the patient is in for total contact cast change. Our intake nurse was concerned about the amount of drainage which soaked right to the multiple layers we are putting on this. Wondered about options. The wound bed actually looks as healthy as this is looked although there may not be a lot of change in dimensions things are looking a lot better. If we are going to heal this woman's wound which is in the middle of her right Charcot foot this is going to need to be offloaded. There are not many alternatives Electronic Signature(s) Signed: 04/11/2019 6:03:33 PM By: Linton Ham MD Entered By: Linton Ham on 04/11/2019 15:27:12 -------------------------------------------------------------------------------- Physical Exam Details Patient Name: Date of Service: Sarah Libra. 04/11/2019 1:30 PM Medical Record [id number] Patient Account Number: [id number] Date of Birth/Sex: Treating RN: 06-24-1955 (63 y.o. F) Primary Care Provider: Dewayne Hatch Other Clinician: Referring Provider: Treating Provider/Extender:Mayra Brahm, [name], [facility] Weeks in Treatment: 10 Notes Wound exam; the area on the right foot did not require debridement but it may in the next week or so has some callus around the edge. The surface of this looks really quite clean no debridement. We have reapplied the total contact cast in the standard fashion Electronic Signature(s) Signed: 04/11/2019 6:03:33 PM By: Linton Ham MD Entered By: Linton Ham on 04/11/2019 15:27:48 -------------------------------------------------------------------------------- Physician Orders Details Patient Name: Date of Service: Sarah Libra. 04/11/2019 1:30 PM Medical Record [id number]  Patient Account Number: [id number] Date of Birth/Sex: Treating RN: [birth year] (63 y.o. Helene Shoe, Tammi Klippel Primary Care Provider: Dewayne Hatch Other Clinician: Referring Provider: Treating Provider/Extender:Brendalee Matthies, [name], [name] Weeks in Treatment: 10 Verbal / Phone Orders: No Diagnosis Coding ICD-10 Coding Code Description E11.621 Type 2 diabetes mellitus with foot ulcer L97.524 Non-pressure chronic ulcer of other part of left foot with necrosis of bone L97.511 Non-pressure chronic ulcer of other part of right foot limited to breakdown of skin L97.514 Non-pressure chronic ulcer of other part of right foot with necrosis of bone E11.42 Type 2 diabetes mellitus with diabetic polyneuropathy M14.671 Charcot's joint, right ankle and foot M14.672 Charcot's joint, left ankle and foot 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 - Monday Return Appointment in: - Wednesday or Friday. Dressing Change Frequency Wound #10 Left,Lateral,Plantar Foot Change Dressing every other day. Wound #9 Right,Medial Foot Do not change entire dressing for one week. Wound Cleansing May  shower with protection. Primary Wound Dressing Wound #10 Left,Lateral,Plantar Foot Calcium Alginate with Silver - lightly pack Wound #9 Right,Medial Foot Hydrofera Blue Secondary Dressing Wound #10 Left,Lateral,Plantar Foot Kerlix/Rolled Gauze Dry Gauze Other: - felt callous pad Wound #9 Right,Medial Foot Kerlix/Rolled Gauze Zetuvit or Kerramax Drawtex Carboflex Off-Loading Total Contact Cast to Right Lower Extremity Open toe surgical shoe to: - add felt to sandal also Electronic Signature(s) Signed: 04/11/2019 6:03:33 PM By: Baltazar Najjar MD Signed: 04/11/2019 6:22:19 PM By: Shawn Stall Entered By: Shawn Stall on 04/11/2019 15:29:46 -------------------------------------------------------------------------------- Problem  List Details Patient Name: Date of Service: [name]. 04/11/2019 1:30 PM Medical Record [id number] Patient Account Number: [id number] Date of Birth/Sex: Treating RN: [birth year] (64 y.o. F) Primary Care Provider: Chevis Pretty Other Clinician: Referring Provider: Treating Provider/Extender:Tanishka Drolet, [name], [facility] Weeks in Treatment: 10 Active Problems ICD-10 Evaluated Encounter Code Description Active Date Today Diagnosis E11.621 Type 2 diabetes mellitus with foot ulcer 01/28/2019 No Yes L97.524 Non-pressure chronic ulcer of other part of left foot 01/28/2019 No Yes with necrosis of bone L97.511 Non-pressure chronic ulcer of other part of right foot 01/28/2019 No Yes limited to breakdown of skin L97.514 Non-pressure chronic ulcer of other part of right foot 01/28/2019 No Yes with necrosis of bone E11.42 Type 2 diabetes mellitus with diabetic polyneuropathy 01/28/2019 No Yes M14.671 Charcot's joint, right ankle and foot 01/28/2019 No Yes M14.672 Charcot's joint, left ankle and foot 01/28/2019 No Yes 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, left ankle and foot 04/08/2019 No Yes Inactive Problems Resolved Problems Electronic Signature(s) Signed: 04/11/2019 6:03:33 PM By: Baltazar Najjar MD Entered By: Baltazar Najjar on 04/11/2019 15:25:38 -------------------------------------------------------------------------------- Progress Note Details Patient Name: Date of Service: [name]. 04/11/2019 1:30 PM Medical Record [id number] Patient Account Number: [id number] Date of Birth/Sex: Treating RN: [birth year] (64 y.o. F) Primary Care Provider: Chevis Pretty Other Clinician: Referring Provider: Treating Provider/Extender:Anacristina Steffek, [name], [facility] Weeks in Treatment: 10 Subjective History of Present Illness (HPI) 64 yrs old bf here for follow up recurrent left charcot foot ulcer. She has DM. She is not a smoker. She states  a blister developed 3 months ago at site of previous breakdown from 1 year ago. It was debrided at the podiatrist's office. She went to the ER and then sent here. She denies pain. Xray was negative for osteo. Culture from this clinic negative. 09/08/14 Referred by [name] to [name] for Charcot foot. Seen by him and MRI scheduled 09/19/14. Prior silver collagen but this was not ordered last visit, patient has been rinsing with saline alone. Re institute silver collagen every other day. F/u 2 weeks with [name] as Labor Day holiday. Supplies ordered. 09/25/14; this is a patient with a Charcot foot on the left. she has a wound over the plantar aspect of her left calcaneus. She has been seen by [name] of orthopedic surgery and has had an MRI. She is apparently going within the next week or 2 for corrective surgery which will involve an external fixator. I don't have any of the details of this. 10/06/14; The patient is a 64 yrs old bf with a left Charcot foot and ulcer on the plantar. 12/01/14 Returns post surgery with [name]. Has follow up visit later this week with him, currently in facility and NWB over this extremity. States no drainage from foot and no current wound care. On exam callus and scab in place, suspect healed. Has external fixator in place. Will defer to [name] for management, ok to place  moisturizing cream over scabs and callus and she will call if she requires follow up here. READMISSION 01/28/2019 Patient was in the clinic in 2016 for a wound on the plantar aspect of her left calcaneus. Predominantly cared for by [name] she was referred to [name] and had corrective surgery for the position of her left ankle I believe. She had previously been here in 2015 and then prior to that in 2011 2012 that I do not have these records. More recently she has developed fairly extensive wounds on the right medial foot, left lateral foot and a small area on the right  medial great toe. Right medial foot wound has been there for 6 to 7 months, left foot wound for 2 to 3 months and the right great toe only over the last few weeks. She has been followed by [name] at Dr. Laverta [place] office it sounds as though she has been undergoing callus paring and silver nitrate. The patient has been washing these off with soap and water and plan applying dry dressing Past medical history, type 2 diabetes well controlled with a recent hemoglobin A1c of 7 on 11/16, type 2 diabetes with peripheral neuropathy, previous left Charcot joint surgery bilateral Charcot joints currently, stage III chronic renal failure, hypertension, obstructive sleep apnea, history of MRSA and gastroesophageal reflux. 1/18; this is a patient with bilateral Charcot feet. Plain x-rays that I did of both of these wounds that are either at bone or precariously close to bone were done. On the right foot this showed possible lytic destruction involving the anterior aspect of the talus which may represent a wound osteomyelitis. An MRI was recommended. On the left there was no definite lytic destruction although the wound is deep undermining's and I think probably requires an MRI on this basis in any case 1/25; patient was supposed to have her MRI last Friday of her bilateral feet however they would not do it because there was silver alginate in her dressings, will replace with calcium alginate today and will see if we can get her done today 2/1; patient did not get her MRIs done last week because of issues with the MRI department receiving orders to do bilateral feet. She has 2 deep wounds in the setting of Charcot feet deformities bilaterally. Both of them at one point had exposed bone although I had trouble demonstrating that today. I am not sure that we can offload these very aggressively as I watched her leave the clinic last week her gait is very narrow and unsteady. 2/15; the MRI of her right foot did  not show osteomyelitis potentially osteonecrosis of the talus. 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 The wounds are a large wound on the right medial foot and on the left a substantial wound on the left lateral plantar calcaneus. We have been using silver alginate. Ultimately I think we need to consider a total contact cast on the right while we work through the possibility of osteomyelitis in the left. I watched her walk out with her crutches I have some trepidation about the ability to walk with the cast on her right foot. Nevertheless with her Charcot deformities I do not think there is a way to heal these short of offloading them aggressively. 2/22; the culture of the bone in the left foot that I did last week showed a few Citrobacter Koseri as well as some streptococcal species. In my attempted bone for pathology did  not actually yield a lot of bone the pathologist did not identify any osteomyelitis. 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 Is promised today and aggressive debridement of the right foot and a total contact cast which surprisingly she seemed to tolerate quite well 03/13/2019 upon evaluation today patient appears to be doing well with her total contact cast she is here for the first cast change. She had no areas of rubbing or any other complications at this point. Overall things are doing quite well. 2/26; the patient was kindly seen by Dr. Orvan Falconer of infectious disease on 2/25. She is now on IV vancomycin PICC line placement in 2 days and oral Flagyl. This was in response to her MRI showing osteomyelitis of the left calcaneus concerning for osteomyelitis. We have been putting her right leg in a total contact cast. Our nurses report drainage. She is tolerating the total contact  cast well. 3/4; the patient is started her IV antibiotics and is taking IV vancomycin and oral Flagyl. She appears to be tolerating these both well. This is for osteomyelitis involving the left calcaneus. The area on the right in the setting of bilateral Charcot deformities did not have any bone infection on MRI. We put a total contact cast on her with silver alginate as the primary dressing and she returns today in follow-up. Per our intake nurse there was too much drainage to leave this on all week therefore we will bring her back for an early change 3/8; follow-up total contact cast she is still having a lot of drainage probably too much drainage from the right foot wound will week with the same cast. She will be back on Thursday. The area on the left looks somewhat better 3/11; patient here for a total contact cast change. 3/15; patient came in for wound care evaluation. She is still on IV vancomycin and oral Flagyl for osteomyelitis in the left foot. The area on the right foot we are putting in a total contact cast. 3/18 for total contact cast change on the right foot 3/22; the patient was here for wound review today. The area on the right foot is slightly smaller in terms of surface area. However the surface of the wound is very gritty and hyper granulated. More problematically the area on the plantar left foot has increased depth indeed in the [name] of this it probes to bone. We have been using Hydrofera Blue in both areas. She is on antibiotics as prescribed by infectious disease IV vancomycin and oral Flagyl 3/25; the patient is in for total contact cast change. Our intake nurse was concerned about the amount of drainage which soaked right to the multiple layers we are putting on this. Wondered about options. The wound bed actually looks as healthy as this is looked although there may not be a lot of change in dimensions things are looking a lot better. If we are going to heal this  woman's wound which is in the middle of her right Charcot foot this is going to need to be offloaded. There are not many alternatives Objective Constitutional Vitals Time Taken: 1:51 PM, Height: 68 in, Weight: 265 lbs, BMI: 40.3, Temperature: 98.3 F, Pulse: 85 bpm, Respiratory Rate: 18 breaths/min, Blood Pressure: 134/70 mmHg. Integumentary (Hair, Skin) Wound #10 status is Open. Original cause of wound was Blister. The wound  is located on the Left,Lateral,Plantar Foot. The wound measures 1.5cm length x 1.7cm width x 1.7cm depth; 2.003cm^2 area and 3.405cm^3 volume. There is bone, muscle, and Fat Layer (Subcutaneous Tissue) Exposed exposed. There is no tunneling or undermining noted. There is a medium amount of serosanguineous drainage noted. The wound margin is thickened. There is large (67-100%) red, pink granulation within the wound bed. There is no necrotic tissue within the wound bed. Wound #9 status is Open. Original cause of wound was Blister. The wound is located on the Right,Medial Foot. The wound measures 3.6cm length x 3cm width x 0.1cm depth; 8.482cm^2 area and 0.848cm^3 volume. There is Fat Layer (Subcutaneous Tissue) Exposed exposed. There is no tunneling or undermining noted. There is a large amount of purulent drainage noted. The wound margin is thickened. There is large (67-100%) red, pink, hyper - granulation within the wound bed. There is no necrotic tissue within the wound bed. Assessment Active Problems ICD-10 Type 2 diabetes mellitus with foot ulcer Non-pressure chronic ulcer of other part of left foot with necrosis of bone Non-pressure chronic ulcer of other part of right foot limited to breakdown of skin Non-pressure chronic ulcer of other part of right foot with necrosis of bone Type 2 diabetes mellitus with diabetic polyneuropathy Charcot's joint, right ankle and foot Charcot's joint, left ankle and foot Other chronic osteomyelitis, left ankle and  foot Procedures Wound #9 Pre-procedure diagnosis of Wound #9 is a Diabetic Wound/Ulcer of the Lower Extremity located on the Right,Medial Foot . There was a Total Contact Cast Procedure by [name], MD. Post procedure Diagnosis Wound #9: Same as Pre-Procedure Plan Follow-up Appointments: Return Appointment in 1 week. - Monday Return Appointment in: - Wednesday or Friday. Dressing Change Frequency: Wound #10 Left,Lateral,Plantar Foot: Change Dressing every other day. Wound #9 Right,Medial Foot: Do not change entire dressing for one week. Wound Cleansing: May shower with protection. Primary Wound Dressing: Wound #10 Left,Lateral,Plantar Foot: Calcium Alginate with Silver - lightly pack Wound #9 Right,Medial Foot: Hydrofera Blue Secondary Dressing: Wound #10 Left,Lateral,Plantar Foot: Kerlix/Rolled Gauze Dry Gauze Other: - felt callous pad Wound #9 Right,Medial Foot: Kerlix/Rolled Gauze Zetuvit or Kerramax Drawtex Carboflex Off-Loading: Total Contact Cast to Right Lower Extremity Open toe surgical shoe to: - add felt to sandal also 1. Continue with Hydrofera Blue classic 2. This may need debridement 3. Total contact cast replaced in the standard fashion 4. Unfortunately we are going to have to change the cast twice a week to try and keep up with the drainage we used Kerramax over the [employer]) Signed: 04/11/2019 6:03:33 PM By: Baltazar Najjar MD Signed: 04/11/2019 6:22:19 PM By: Shawn Stall Entered By: Shawn Stall on 04/11/2019 16:11:51 -------------------------------------------------------------------------------- Total Contact Cast Details Patient Name: Date of Service: [name]. 04/11/2019 1:30 PM Medical Record [id number] Patient Account Number: [id number] Date of Birth/Sex: Treating RN: July 12, 1955 (64 y.o. [name] Primary Care Provider: Chevis Pretty Other Clinician: Referring Provider:  Chevis Pretty Treating Provider/Extender:Stpehen Petitjean, Lamar Sprinkles in Treatment: 10 Total Contact Cast Applied for Wound Assessment: Wound #9 Right,Medial Foot Performed By: Physician [name], MD Post Procedure Diagnosis Same as Pre-procedure Electronic Signature(s) Signed: 04/11/2019 6:03:33 PM By: Baltazar Najjar MD Signed: 04/11/2019 6:22:19 PM By: Shawn Stall Entered By: Shawn Stall on 04/11/2019 15:47:24 -------------------------------------------------------------------------------- SuperBill Details Patient Name: Date of Service: [name] 04/11/2019 Medical Record [id number] Patient Account Number: [id number] Date of Birth/Sex: Treating RN: March 21, 1955 (63 y.o. 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 M14.671 Charcot's joint, right ankle and foot M14.672 Charcot's joint, left ankle and foot 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, left ankle and foot Facility Procedures CPT4 Code Description: 35573220 [id number] - APPLY TOTAL CONTACT LEG CAST ICD-10 Diagnosis Description L97.511 Non-pressure chronic ulcer of other part of right foot limit Modifier: ed to breakdo Quantity: 1 wn of skin Physician Procedures CPT4 Code Description: 2542706 [id number] - WC PHYS APPLY TOTAL CONTACT CAST ICD-10 Diagnosis Description L97.511 Non-pressure chronic ulcer of other part of right foot limite Modifier: d to breakdo Quantity: 1  wn of skin Electronic Signature(s) Signed: 04/11/2019 6:03:33 PM By: Baltazar Najjar MD Entered By: Baltazar Najjar on 04/11/2019 15:29:45

## 2019-04-12 NOTE — Progress Notes (Signed)
SOPHIAGRACE, BENBROOK ([id number]) Visit Report for 04/11/2019 Arrival Information Details Patient Name: Date of Service: KAISLEY, STIVERSON 04/11/2019 1:30 PM Medical Record [id number] Patient Account Number: [id number] Date of Birth/Sex: Treating RN: December 26, 1955 (64 y.o. Orvan Falconer Primary Care Dyanara Cozza: Dewayne Hatch Other Clinician: Referring Paradise Vensel: Treating Jeanann Balinski/Extender:Robson, [name], [facility] Weeks in Treatment: 10 Visit Information History Since Last Visit All ordered tests and consults were No Patient Arrived: Crutches completed: Arrival Time: 13:49 Added or deleted any medications: No Accompanied By: self Any new allergies or adverse reactions: No Transfer Assistance: None Had a fall or experienced change in No Patient Identification Verified: Yes activities of daily living that may affect Secondary Verification Process Completed: Yes risk of falls: Patient Requires Transmission-Based No Signs or symptoms of abuse/neglect since No Precautions: last visito Patient Has Alerts: No Hospitalized since last visit: No Implantable device outside of the clinic No excluding cellular tissue based products placed in the center since last visit: Has Dressing in Place as Prescribed: Yes Has Footwear/Offloading in Place as Yes Prescribed: Right: Total Contact Cast Pain Present Now: No Electronic Signature(s) Signed: 04/12/2019 5:43:34 PM By: Carlene Coria RN Entered By: Carlene Coria on 04/11/2019 13:50:36 -------------------------------------------------------------------------------- Encounter Discharge Information Details Patient Name: Date of Service: [name] 04/11/2019 1:30 PM Medical Record [id number] Patient Account Number: [id number] Date of Birth/Sex: Treating RN: [birth year] (64 y.o. Orvan Falconer Primary Care Thelmer Legler: Dewayne Hatch Other Clinician: Referring Ressie Slevin: Treating  Kylinn Shropshire/Extender:Robson, [name], [facility] Weeks in Treatment: 10 Encounter Discharge Information Items Discharge Condition: Stable Ambulatory Status: Crutches Discharge Destination: Home Transportation: Private Auto Accompanied By: self Schedule Follow-up Appointment: Yes Clinical Summary of Care: Patient Declined Electronic Signature(s) Signed: 04/12/2019 5:43:34 PM By: Carlene Coria RN Entered By: Carlene Coria on 04/11/2019 15:13:13 -------------------------------------------------------------------------------- Lower Extremity Assessment Details Patient Name: Date of Service: JAHNESSA, VANDUYN 04/11/2019 1:30 PM Medical Record [id number] Patient Account Number: [id number] Date of Birth/Sex: Treating RN: Dec 27, 1955 (64 y.o. Orvan Falconer Primary Care Javarious Elsayed: Dewayne Hatch Other Clinician: Referring Gusta Marksberry: Treating Leandrea Ackley/Extender:Robson, [name], [facility] Weeks in Treatment: 10 Edema Assessment Assessed: [Left: No] [Right: No] Edema: [Left: Yes] [Right: Yes] Calf Left: Right: Point of Measurement: 43 cm From Medial Instep 39 cm 33 cm Ankle Left: Right: Point of Measurement: 12 cm From Medial Instep 26 cm 28 cm Electronic Signature(s) Signed: 04/12/2019 5:43:34 PM By: Carlene Coria RN Entered By: Carlene Coria on 04/11/2019 13:51:44 -------------------------------------------------------------------------------- Multi Wound Chart Details Patient Name: Date of Service: [name] 04/11/2019 1:30 PM Medical Record [id number] Patient Account Number: [id number] Date of Birth/Sex: Treating RN: 1955-06-24 (64 y.o. F) Primary Care Janylah Belgrave: Other Clinician: Dewayne Hatch Referring Nicholes Hibler: Treating Rakwon Letourneau/Extender:Robson, [name], [facility] Weeks in Treatment: 10 Vital Signs Height(in): 68 Pulse(bpm): 13 Weight(lbs): 3 Blood Pressure(mmHg): 134/70 Body Mass Index(BMI):  40 Temperature(F): 98.3 Respiratory 18 Rate(breaths/min): Photos: [10:No Photos] [9:No Photos] [N/A:N/A] Wound Location: [10:Left, Lateral, Plantar Foot Right, Medial Foot] [N/A:N/A] Wounding Event: [10:Blister] [9:Blister] [N/A:N/A] Primary Etiology: [10:Diabetic Wound/Ulcer of the Diabetic Wound/Ulcer of the N/A Lower Extremity] [9:Lower Extremity] Comorbid History: [10:Glaucoma, Chronic sinus Glaucoma, Chronic sinus N/A problems/congestion, Anemia, Sleep Apnea, Hypertension, Type II Diabetes, Rheumatoid Arthritis, Neuropathy] [9:problems/congestion, Anemia, Sleep Apnea, Hypertension, Type II  Diabetes, Rheumatoid Arthritis, Neuropathy] Date Acquired: [10:11/18/2018] [9:06/18/2018] [N/A:N/A] Weeks of Treatment: [10:10] [9:10] [N/A:N/A] Wound Status: [10:Open] [9:Open] [N/A:N/A] Measurements L x W x D 1.5x1.7x1.7 [9:3.6x3x0.1] [N/A:N/A] (cm) Area (cm) : [10:2.003] [9:8.482] [N/A:N/A] Volume (cm) : [10:3.405] [9:0.848] [N/A:N/A] % Reduction in Area: [10:-13.90%] [9:22.90%] [N/A:N/A] %  Reduction in Volume: -29.00% [9:91.40%] [N/A:N/A] Classification: [10:Grade 2] [9:Grade 2] [N/A:N/A] Exudate Amount: [10:Medium] [9:Large] [N/A:N/A] Exudate Type: [10:Serosanguineous] [9:Purulent] [N/A:N/A] Exudate Color: [10:red, brown] [9:yellow, brown, green] [N/A:N/A] Wound Margin: [10:Thickened] [9:Thickened] [N/A:N/A] Granulation Amount: [10:Large (67-100%)] [9:Large (67-100%)] [N/A:N/A] Granulation Quality: [10:Red, Pink] [9:Red, Pink, Hyper- granulation] [N/A:N/A] Necrotic Amount: [10:None Present (0%)] [9:None Present (0%)] [N/A:N/A] Exposed Structures: [10:Fat Layer (Subcutaneous Fat Layer (Subcutaneous N/A Tissue) Exposed: Yes Muscle: Yes Bone: Yes Fascia: No Tendon: No Joint: No Small (1-33%)] [9:Tissue) Exposed: Yes Fascia: No Tendon: No Muscle: No Joint: No Bone: No Small (1-33%)] [N/A:N/A] Treatment Notes Wound #10 (Left, Lateral, Plantar Foot) 1. Cleanse With Wound Cleanser Soap and  water 3. Primary Dressing Applied Calcium Alginate Ag 4. Secondary Dressing Dry Gauze Roll Gauze 5. Secured With Tape Notes x2 felt. stockinette Wound #9 (Right, Medial Foot) 1. Cleanse With Wound Cleanser Soap and water 3. Primary Dressing Applied Hydrofera Blue 4. Secondary Dressing ABD Pad Dry Gauze Roll Gauze Kerramax/Xtrasorb Drawtex 7. Footwear/Offloading device applied Total Contact Cast Notes carboflex. TCC applied by MD. Electronic Signature(s) Signed: 04/11/2019 6:03:33 PM By: Baltazar Najjar MD Entered By: Baltazar Najjar on 04/11/2019 15:25:48 -------------------------------------------------------------------------------- Pain Assessment Details Patient Name: Date of Service: [name] 04/11/2019 1:30 PM Medical Record [id number] Patient Account Number: [id number] Date of Birth/Sex: Treating RN: 07-26-1955 (63 y.o. [name] Primary Care Nilo Fallin: Chevis Pretty Other Clinician: Referring Deniqua Perry: Treating Dejon Jungman/Extender:Robson, [name], [facility] Weeks in Treatment: 10 Active Problems Location of Pain Severity and Description of Pain Patient Has Paino No Site Locations Pain Management and Medication Current Pain Management: Electronic Signature(s) Signed: 04/12/2019 5:43:34 PM By: Yevonne Pax RN Entered By: Yevonne Pax on 04/11/2019 13:51:23 -------------------------------------------------------------------------------- Wound Assessment Details Patient Name: Date of Service: MAGHEN, GROUP 04/11/2019 1:30 PM Medical Record [id number] Patient Account Number: [id number] Date of Birth/Sex: Treating RN: 06-20-1955 (64 y.o. [name] Primary Care Zack Crager: Chevis Pretty Other Clinician: Referring Nabil Bubolz: Treating Lola Lofaro/Extender:Robson, [name], [facility] Weeks in Treatment: 10 Wound Status Wound Number: 10 Primary Diabetic Wound/Ulcer of the Lower  Extremity Etiology: Wound Location: Left, Lateral, Plantar Foot Wound Open Wounding Event: Blister Status: Date Acquired: 11/18/2018 Comorbid Glaucoma, Chronic sinus Weeks Of Treatment: 10 History: problems/congestion, Anemia, Sleep Apnea, Clustered Wound: No Hypertension, Type II Diabetes, Rheumatoid Arthritis, Neuropathy Photos Photo Uploaded By: Benjaman Kindler on 04/12/2019 12:58:27 Wound Measurements Length: (cm) 1.5 % Reduction Width: (cm) 1.7 % Reduction Depth: (cm) 1.7 Epitheliali Area: (cm) 2.003 Tunneling: Volume: (cm) 3.405 Underminin Wound Description Classification: Grade 2 Wound Margin: Thickened Exudate Amount: Medium Exudate Type: Serosanguineous Exudate Color: red, brown Wound Bed Granulation Amount: Large (67-100%) Granulation Quality: Red, Pink Necrotic Amount: None Present (0%) Foul Odor After Cleansing: No Slough/Fibrino No Exposed Structure Fascia Exposed: No Fat Layer (Subcutaneous Tissue) Exposed: Yes Tendon Exposed: No Muscle Exposed: Yes Necrosis of Muscle: No Joint Exposed: No Bone Exposed: Yes in Area: -13.9% in Volume: -29% zation: Small (1-33%) No g: No Treatment Notes Wound #10 (Left, Lateral, Plantar Foot) 1. Cleanse With Wound Cleanser Soap and water 3. Primary Dressing Applied Calcium Alginate Ag 4. Secondary Dressing Dry Gauze Roll Gauze 5. Secured With Tape Notes x2 felt. stockinette Electronic Signature(s) Signed: 04/12/2019 5:43:34 PM By: Yevonne Pax RN Entered By: Yevonne Pax on 04/11/2019 14:12:47 -------------------------------------------------------------------------------- Wound Assessment Details Patient Name: Date of Service: VALINA, MAES 04/11/2019 1:30 PM Medical Record [id number] Patient Account Number: [id number] Date of Birth/Sex: Treating RN: 23-Mar-1955 (64 y.o. [name] Primary Care Cathryn Gallery: Chevis Pretty Other Clinician:  Referring Naia Ruff: Treating  Boni Maclellan/Extender:Robson, [name], [facility] Weeks in Treatment: 10 Wound Status Wound Number: 9 Primary Diabetic Wound/Ulcer of the Lower Extremity Etiology: Wound Location: Right, Medial Foot Wound Open Wounding Event: Blister Status: Date Acquired: 06/18/2018 Comorbid Glaucoma, Chronic sinus Weeks Of Treatment: 10 History: problems/congestion, Anemia, Sleep Apnea, Clustered Wound: No Hypertension, Type II Diabetes, Rheumatoid Arthritis, Neuropathy Photos Photo Uploaded By: Benjaman Kindler on 04/12/2019 12:58:27 Wound Measurements Length: (cm) 3.6 % Reduction Width: (cm) 3 % Reduction Depth: (cm) 0.1 Epitheliali Area: (cm) 8.482 Tunneling: Volume: (cm) 0.848 Underminin Wound Description Classification: Grade 2 Foul Odor A Wound Margin: Thickened Slough/Fibr Exudate Amount: Large Exudate Type: Purulent Exudate Color: yellow, brown, green Wound Bed Granulation Amount: Large (67-100%) Granulation Quality: Red, Pink, Hyper-granulation Fascia Expo Necrotic Amount: None Present (0%) Fat Layer ( Tendon Expo Muscle Expo Joint Expos Bone Expose fter Cleansing: No ino No Exposed Structure sed: No Subcutaneous Tissue) Exposed: Yes sed: No sed: No ed: No d: No in Area: 22.9% in Volume: 91.4% zation: Small (1-33%) No g: No Treatment Notes Wound #9 (Right, Medial Foot) 1. Cleanse With Wound Cleanser Soap and water 3. Primary Dressing Applied Hydrofera Blue 4. Secondary Dressing ABD Pad Dry Gauze Roll Gauze Kerramax/Xtrasorb Drawtex 7. Footwear/Offloading device applied Total Contact Cast Notes carboflex. TCC applied by MD. Electronic Signature(s) Signed: 04/12/2019 5:43:34 PM By: Yevonne Pax RN Entered By: Yevonne Pax on 04/11/2019 14:12:47 -------------------------------------------------------------------------------- Vitals Details Patient Name: Date of Service: [name] 04/11/2019 1:30 PM Medical Record [id number]  Patient Account Number: [id number] Date of Birth/Sex: Treating RN: [birth year] (63 y.o. [name] Primary Care Serenidy Waltz: Chevis Pretty Other Clinician: Referring Ioane Bhola: Treating Tahj Njoku/Extender:Robson, [name], [facility] Weeks in Treatment: 10 Vital Signs Time Taken: 13:51 Temperature (F): 98.3 Height (in): 68 Pulse (bpm): 85 Weight (lbs): 265 Respiratory Rate (breaths/min): 18 Body Mass Index (BMI): 40.3 Blood Pressure (mmHg): 134/70 Reference Range: 80 - 120 mg / dl Electronic Signature(s) Signed: 04/12/2019 5:43:34 PM By: Yevonne Pax RN Entered By: Yevonne Pax on 04/11/2019 13:51:17

## 2019-04-15 ENCOUNTER — Other Ambulatory Visit: Payer: Self-pay

## 2019-04-15 ENCOUNTER — Encounter (HOSPITAL_BASED_OUTPATIENT_CLINIC_OR_DEPARTMENT_OTHER): Payer: Medicare Other | Admitting: Internal Medicine

## 2019-04-15 ENCOUNTER — Other Ambulatory Visit (HOSPITAL_COMMUNITY): Payer: Self-pay | Admitting: Internal Medicine

## 2019-04-15 ENCOUNTER — Telehealth: Payer: Self-pay

## 2019-04-15 ENCOUNTER — Encounter: Payer: Self-pay | Admitting: Internal Medicine

## 2019-04-15 DIAGNOSIS — E11621 Type 2 diabetes mellitus with foot ulcer: Secondary | ICD-10-CM | POA: Diagnosis not present

## 2019-04-15 DIAGNOSIS — M869 Osteomyelitis, unspecified: Secondary | ICD-10-CM

## 2019-04-15 NOTE — Telephone Encounter (Signed)
Please arrange for the PICC to be replaced.  Thanks.

## 2019-04-15 NOTE — Telephone Encounter (Signed)
Left message with patients family member to call office back. Patient has been scheduled for PICC placement on 3/31 @ 10am. Patient to call back to confirm appointment.   Hao Dion Loyola Mast, RN

## 2019-04-15 NOTE — Telephone Encounter (Signed)
Received call from patient's home health RN informing that upon arrival to patient's home for PICC care, RN noticed the PICC line was no longer in place. Patient is receiving 2g daily Ceftriaxone. Will forward to provider for advice.   Lonna Rabold Loyola Mast, RN

## 2019-04-17 ENCOUNTER — Ambulatory Visit (HOSPITAL_COMMUNITY)
Admission: RE | Admit: 2019-04-17 | Discharge: 2019-04-17 | Disposition: A | Discharge status: Home / Self Care | Payer: Medicare Other | Source: Ambulatory Visit | Attending: Internal Medicine | Admitting: Internal Medicine

## 2019-04-17 ENCOUNTER — Other Ambulatory Visit: Payer: Self-pay

## 2019-04-17 ENCOUNTER — Encounter (HOSPITAL_BASED_OUTPATIENT_CLINIC_OR_DEPARTMENT_OTHER): Payer: Medicare Other | Admitting: Physician Assistant

## 2019-04-17 DIAGNOSIS — M869 Osteomyelitis, unspecified: Secondary | ICD-10-CM | POA: Diagnosis present

## 2019-04-17 DIAGNOSIS — E11621 Type 2 diabetes mellitus with foot ulcer: Secondary | ICD-10-CM | POA: Diagnosis not present

## 2019-04-17 MED ORDER — HEPARIN SOD (PORK) LOCK FLUSH 100 UNIT/ML IV SOLN
INTRAVENOUS | Status: AC
  Filled 2019-04-17: qty 5

## 2019-04-17 MED ORDER — LIDOCAINE HCL 1 % IJ SOLN
INTRAMUSCULAR | Status: DC | PRN
  Administered 2019-04-17: 5 mL

## 2019-04-17 MED ORDER — LIDOCAINE HCL 1 % IJ SOLN
INTRAMUSCULAR | Status: AC
  Filled 2019-04-17: qty 20

## 2019-04-17 NOTE — Progress Notes (Signed)
[name], [name] ([id number]) Visit Report for 04/17/2019 Arrival Information Details Patient Name: Date of Service: [name], [name] 04/17/2019 1:15 PM Medical Record [id number] Patient Account Number: [id number] Date of Birth/Sex: Treating RN: 1955-02-04 (64 y.o. Debara Pickett, Millard.Loa Primary Care Chadd Tollison: Chevis Pretty Other Clinician: Referring Eria Lozoya: Treating Nicha Hemann/Extender:Stone III, Donnald Garre, ELHAMALSADAT Weeks in Treatment: 11 Visit Information History Since Last Visit Added or deleted any medications: No Patient Arrived: Crutches Any new allergies or adverse reactions: No Arrival Time: 13:16 Had a fall or experienced change in No Accompanied By: self activities of daily living that may affect Transfer Assistance: None risk of falls: Patient Identification Verified: Yes Signs or symptoms of abuse/neglect No Secondary Verification Process Completed: Yes since last visito Patient Requires Transmission-Based No Hospitalized since last visit: No Precautions: Implantable device outside of the clinic No Patient Has Alerts: No excluding cellular tissue based products placed in the center since last visit: Has Dressing in Place as Prescribed: Yes Has Footwear/Offloading in Place as Yes Prescribed: Right: Total Contact Cast Pain Present Now: Yes Electronic Signature(s) Signed: 04/17/2019 5:08:19 PM By: Shawn Stall Entered By: Shawn Stall on 04/17/2019 13:16:34 -------------------------------------------------------------------------------- Encounter Discharge Information Details Patient Name: Date of Service: [name] [name]. 04/17/2019 1:15 PM Medical Record [id number] Patient Account Number: [id number] Date of Birth/Sex: Treating RN: 01-06-1956 (64 y.o. [name] Primary Care Kyren Vaux: Chevis Pretty Other Clinician: Referring Glenice Ciccone: Treating Casey Fye/Extender:Stone III, Donnald Garre, ELHAMALSADAT Weeks in  Treatment: 11 Encounter Discharge Information Items Discharge Condition: Stable Ambulatory Status: Crutches Discharge Destination: Home Transportation: Private Auto Accompanied By: alone Schedule Follow-up Appointment: Yes Clinical Summary of Care: Electronic Signature(s) Signed: 04/17/2019 5:08:19 PM By: Shawn Stall Entered By: Shawn Stall on 04/17/2019 13:46:13 -------------------------------------------------------------------------------- Lower Extremity Assessment Details Patient Name: Date of Service: [name], [name] 04/17/2019 1:15 PM Medical Record [id number] Patient Account Number: [id number] Date of Birth/Sex: Treating RN: May 08, 1955 (63 y.o. [name] Primary Care Alvin Rubano: Chevis Pretty Other Clinician: Referring Isidore Margraf: Treating Charmelle Soh/Extender:Stone III, Donnald Garre, ELHAMALSADAT Weeks in Treatment: 11 Edema Assessment Assessed: [Left: Yes] [Right: Yes] Edema: [Left: Yes] [Right: Yes] Calf Left: Right: Point of Measurement: 43 cm From Medial Instep 37 cm 40.5 cm Ankle Left: Right: Point of Measurement: 12 cm From Medial Instep 24.5 cm 28 cm Electronic Signature(s) Signed: 04/17/2019 5:08:19 PM By: Shawn Stall Entered By: Shawn Stall on 04/17/2019 13:42:13 -------------------------------------------------------------------------------- Multi-Disciplinary Care Plan Details Patient Name: Date of Service: [name] 04/17/2019 1:15 PM Medical Record [id number] Patient Account Number: [id number] Date of Birth/Sex: Treating RN: 10-31-1955 (64 y.o. [name] Primary Care Stefany Starace: Chevis Pretty Other Clinician: Referring Girtha Kilgore: Chevis Pretty Treating Clarabell Matsuoka/Extender:Stone III, Jake Samples in Treatment: 11 Active Inactive Wound/Skin Impairment Nursing Diagnoses: Impaired tissue integrity Knowledge deficit related to ulceration/compromised skin integrity Goals: Patient/caregiver will  verbalize understanding of skin care regimen Date Initiated: 01/28/2019 Target Resolution Date: 05/16/2019 Goal Status: Active Interventions: Assess patient/caregiver ability to obtain necessary supplies Assess patient/caregiver ability to perform ulcer/skin care regimen upon admission and as needed Assess ulceration(s) every visit Provide education on ulcer and skin care Notes: Electronic Signature(s) Signed: 04/17/2019 5:08:19 PM By: Shawn Stall Entered By: Shawn Stall on 04/17/2019 13:43:56 -------------------------------------------------------------------------------- Pain Assessment Details Patient Name: Date of Service: DALEXA, GENTZ 04/17/2019 1:15 PM Medical Record [id number] Patient Account Number: [id number] Date of Birth/Sex: Treating RN: [birth year] (64 y.o. [name] Primary Care Zikeria Keough: Chevis Pretty Other Clinician: Referring Sofi Bryars: Treating Tehila Sokolow/Extender:Stone III, Donnald Garre, ELHAMALSADAT Weeks in Treatment: 11 Active Problems Location of Pain  Severity and Description of Pain Patient Has Paino Yes Site Locations Rate the pain. Current Pain Level: 4 Worst Pain Level: 10 Least Pain Level: 0 Tolerable Pain Level: 8 Pain Management and Medication Current Pain Management: Medication: No Cold Application: No Rest: No Massage: No Activity: No T.E.N.S.: No Heat Application: No Leg drop or elevation: No Is the Current Pain Management Adequate: Adequate How does your wound impact your activities of daily livingo Sleep: No Bathing: No Appetite: No Relationship With Others: No Bladder Continence: No Emotions: No Bowel Continence: No Work: No Toileting: No Drive: No Dressing: No Hobbies: No Electronic Signature(s) Signed: 04/17/2019 5:08:19 PM By: Deon Pilling Entered By: Deon Pilling on 04/17/2019 13:19:39 -------------------------------------------------------------------------------- Patient/Caregiver  Education Details Patient Name: [name] 3/31/2021andnbsp1:15 Date of Service: PM Medical Record [id number] Number: Patient Account Number: [id number] Date of Birth/Gender: 02-06-1955 (63 y.o. F) Treating RN: Deon Pilling Primary Care Other Clinician: Myrtie Hawk, Physician: Dorthula Rue Treating Worthy Keeler Physician/Extender: Referring Physician: Avon Gully in Treatment: 11 Education Assessment Education Provided To: Patient Education Topics Provided Wound/Skin Impairment: Handouts: Caring for Your Ulcer Methods: Explain/Verbal Responses: Reinforcements needed Electronic Signature(s) Signed: 04/17/2019 5:08:19 PM By: Deon Pilling Entered By: Deon Pilling on 04/17/2019 13:44:11 -------------------------------------------------------------------------------- Wound Assessment Details Patient Name: Date of Service: [name], [name] 04/17/2019 1:15 PM Medical Record [id number] Patient Account Number: [id number] Date of Birth/Sex: Treating RN: [birth year] (63 y.o. Helene Shoe, Meta.Reding Primary Care Shamariah Shewmake: Dewayne Hatch Other Clinician: Referring Donal Lynam: Treating Bailee Thall/Extender:Stone III, Darrick Penna, ELHAMALSADAT Weeks in Treatment: 11 Wound Status Wound Number: 9 Primary Diabetic Wound/Ulcer of the Lower Extremity Etiology: Wound Location: Right Foot - Medial Wound Open Wounding Event: Blister Status: Date Acquired: 06/18/2018 Comorbid Glaucoma, Chronic sinus Weeks Of Treatment: 11 History: problems/congestion, Anemia, Sleep Apnea, Clustered Wound: No Hypertension, Type II Diabetes, Rheumatoid Arthritis, Neuropathy Wound Measurements Length: (cm) 3.5 % Reductio Width: (cm) 3 % Reductio Depth: (cm) 0.3 Epithelial Area: (cm) 8.247 Tunneling Volume: (cm) 2.474 Undermini Wound Description Classification: Grade 2 Wound Margin: Thickened Exudate Amount: Large Exudate Type: Purulent Exudate Color: yellow, brown,  green Wound Bed Granulation Amount: Large (67-100%) Granulation Quality: Red, Pink, Hyper-granulation Necrotic Amount: None Present (0%) Treatment Notes Wound #9 (Right, Medial Foot) 1. Cleanse With Wound Cleanser Soap and water 2. Periwound Care Barrier cream Moisturizing lotion 3. Primary Dressing Applied Hydrofera Blue 4. Secondary Dressing Dry Gauze Kerramax/Xtrasorb Other secondary dressing (specify in notes) Drawtex 5. Secured With Medipore tape 7. Footwear/Offloading device applied Total Contact Cast Foul Odor After Cleansing: No Slough/Fibrino No Exposed Structure Fascia Exposed: No Fat Layer (Subcutaneous Tissue) Exposed: Yes Tendon Exposed: No Muscle Exposed: No Joint Exposed: No Bone Exposed: No n in Area: 25% n in Volume: 75% ization: Small (1-33%) : No ng: No Notes carboflex. TCC applied by MD. Electronic Signature(s) Signed: 04/17/2019 5:08:19 PM By: Deon Pilling Entered By: Deon Pilling on 04/17/2019 13:43:02 -------------------------------------------------------------------------------- Vitals Details Patient Name: Date of Service: [name] 04/17/2019 1:15 PM Medical Record [id number] Patient Account Number: [id number] Date of Birth/Sex: Treating RN: [birth year] (64 y.o. [name] Primary Care Nelida Mandarino: Dewayne Hatch Other Clinician: Referring Lesleyann Fichter: Treating Tully Burgo/Extender:Stone III, Darrick Penna, ELHAMALSADAT Weeks in Treatment: 11 Vital Signs Time Taken: 13:18 Temperature (F): 98.2 Height (in): 68 Pulse (bpm): 70 Weight (lbs): 265 Respiratory Rate (breaths/min): 18 Body Mass Index (BMI): 40.3 Blood Pressure (mmHg): 151/65 Reference Range: 80 - 120 mg / dl Electronic Signature(s) Signed: 04/17/2019 5:08:19 PM By: Deon Pilling Entered By: Deon Pilling on 04/17/2019 13:20:18

## 2019-04-17 NOTE — Progress Notes (Addendum)
[name], [name] ([id number]) Visit Report for 04/17/2019 Chief Complaint Document Details Patient Name: Date of Service: Sarah, [name] 04/17/2019 1:15 PM Medical Record [name] Patient Account Number: [id number] Date of Birth/Sex: Treating RN: 1955-04-21 (64 y.o. [name]: [name]: Referring [name]: Treating [name]/Extender:[name], [name]: 11 Information Obtained from: Patient Chief Complaint Patients presents for follow up of a diabetic ulcer 01/28/2019; patient is here for review of diabetic foot ulcers bilaterally Electronic Signature(s) Signed: 04/17/2019 1:20:42 PM By: [name] Entered By: [name] -------------------------------------------------------------------------------- HPI Details Patient Name: Date of Service: [name] 04/17/2019 1:15 PM Medical Record [name] Patient Account Number: [id number] Date of Birth/Sex: Treating RN: 02/14/1955 (63 y.o. [name]: [name]: Referring [name]: Treating [name]/Extender:[name], [name]: 11 History of Present Illness HPI Description: 65 yrs old bf here for follow up recurrent left charcot foot ulcer. She has DM. She is not a smoker. She states a blister developed 3 months ago at site of previous breakdown from 1 year ago. It was debrided at the podiatrist's office. She went to the ER and then sent here. She denies pain. Xray was negative for osteo. Culture from this clinic negative. 09/08/14 Referred by [name] to [name] for Charcot foot. Seen by him and MRI scheduled 09/19/14. Prior silver collagen but this was not ordered last visit, patient has been rinsing with saline alone. Re institute silver collagen every other day. F/u 2 weeks with  [name] as Labor Day holiday. Supplies ordered. 09/25/14; this is a patient with a Charcot foot on the left. she has a wound over the plantar aspect of her left calcaneus. She has been seen by [name] of orthopedic surgery and has had an MRI. She is apparently going within the next week or 2 for corrective surgery which will involve an external fixator. I don't have any of the details of this. 10/06/14; The patient is a 63 yrs old bf with a left Charcot foot and ulcer on the plantar. 12/01/14 Returns post surgery with [name]. Has follow up visit later this week with him, currently in facility and NWB over this extremity. States no drainage from foot and no current wound care. On exam callus and scab in place, suspect healed. Has external fixator in place. Will defer to [name] for management, ok to place moisturizing cream over scabs and callus and she will call if she requires follow up here. READMISSION 01/28/2019 Patient was in the clinic in 2016 for a wound on the plantar aspect of her left calcaneus. Predominantly cared for by [name] she was referred to [name] and had corrective surgery for the position of her left ankle I believe. She had previously been here in 2015 and then prior to that in 2011 2012 that I do not have these records. More recently she has developed fairly extensive wounds on the right medial foot, left lateral foot and a small area on the right medial great toe. Right medial foot wound has been there for 6 to 7 months, left foot wound for 2 to 3 months and the right great toe only over the last few weeks. She has been followed by [name] at Dr. Laverta [place] office it sounds as though she has been undergoing callus paring and silver nitrate. The patient has been washing these off with  soap and water and plan applying dry dressing Past medical history, type 2 diabetes well controlled with a recent hemoglobin A1c of 7 on 11/16, type 2 diabetes with  peripheral neuropathy, previous left Charcot joint surgery bilateral Charcot joints currently, stage III chronic renal failure, hypertension, obstructive sleep apnea, history of MRSA and gastroesophageal reflux. 1/18; this is a patient with bilateral Charcot feet. Plain x-rays that I did of both of these wounds that are either at bone or precariously close to bone were done. On the right foot this showed possible lytic destruction involving the anterior aspect of the talus which may represent a wound osteomyelitis. An MRI was recommended. On the left there was no definite lytic destruction although the wound is deep undermining's and I think probably requires an MRI on this basis in any case 1/25; patient was supposed to have her MRI last Friday of her bilateral feet however they would not do it because there was silver alginate in her dressings, will replace with calcium alginate today and will see if we can get her done today 2/1; patient did not get her MRIs done last week because of issues with the MRI department receiving orders to do bilateral feet. She has 2 deep wounds in the setting of Charcot feet deformities bilaterally. Both of them at one point had exposed bone although I had trouble demonstrating that today. I am not sure that we can offload these very aggressively as I watched her leave the clinic last week her gait is very narrow and unsteady. 2/15; the MRI of her right foot did not show osteomyelitis potentially osteonecrosis of the talus. 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 The wounds are a large wound on the right medial foot and on the left a substantial wound on the left lateral plantar calcaneus. We have been using silver alginate. Ultimately I think we need to consider a total contact cast on the right while we work through the possibility of osteomyelitis in the left. I watched her walk out with her crutches I have some  trepidation about the ability to walk with the cast on her right foot. Nevertheless with her Charcot deformities I do not think there is a way to heal these short of offloading them aggressively. 2/22; the culture of the bone in the left foot that I did last week showed a few Citrobacter Koseri as well as some streptococcal species. In my attempted bone for pathology did not actually yield a lot of bone the pathologist did not identify any osteomyelitis. 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 Is promised today and aggressive debridement of the right foot and a total contact cast which surprisingly she seemed to tolerate quite well 03/13/2019 upon evaluation today patient appears to be doing well with her total contact cast she is here for the first cast change. She had no areas of rubbing or any other complications at this point. Overall things are doing quite well. 2/26; the patient was kindly seen by Dr. Orvan Falconer of infectious disease on 2/25. She is now on IV vancomycin PICC line placement in 2 days and oral Flagyl. This was in response to her MRI showing osteomyelitis of the left calcaneus concerning for osteomyelitis. We have been putting her right leg in a total contact cast. Our nurses  report drainage. She is tolerating the total contact cast well. 3/4; the patient is started her IV antibiotics and is taking IV vancomycin and oral Flagyl. She appears to be tolerating these both well. This is for osteomyelitis involving the left calcaneus. The area on the right in the setting of bilateral Charcot deformities did not have any bone infection on MRI. We put a total contact cast on her with silver alginate as the primary dressing and she returns today in follow-up. Per our intake nurse there was too much drainage to leave this on all week  therefore we will bring her back for an early change 3/8; follow-up total contact cast she is still having a lot of drainage probably too much drainage from the right foot wound will week with the same cast. She will be back on Thursday. The area on the left looks somewhat better 3/11; patient here for a total contact cast change. 3/15; patient came in for wound care evaluation. She is still on IV vancomycin and oral Flagyl for osteomyelitis in the left foot. The area on the right foot we are putting in a total contact cast. 3/18 for total contact cast change on the right foot 3/22; the patient was here for wound review today. The area on the right foot is slightly smaller in terms of surface area. However the surface of the wound is very gritty and hyper granulated. More problematically the area on the plantar left foot has increased depth indeed in the [name] of this it probes to bone. We have been using Hydrofera Blue in both areas. She is on antibiotics as prescribed by infectious disease IV vancomycin and oral Flagyl 3/25; the patient is in for total contact cast change. Our intake nurse was concerned about the amount of drainage which soaked right to the multiple layers we are putting on this. Wondered about options. The wound bed actually looks as healthy as this is looked although there may not be a lot of change in dimensions things are looking a lot better. If we are going to heal this woman's wound which is in the middle of her right Charcot foot this is going to need to be offloaded. There are not many alternatives 3/29; still not a lot of improvement although the surface of the right wound looks better. We have been using Hydrofera Blue 04/17/2019 upon evaluation today patient appears to be doing well with a total contact cast. With actually having to change this twice a week. She saw Dr. Dellia Nims for wound care earlier in the week this is for the cast change today. That is due to the  fact that she has significant drainage. Overall the wound is been looking excellent however. Electronic Signature(s) Signed: 04/17/2019 2:30:36 PM By: Worthy Keeler [name] Entered By: Worthy Keeler on 04/17/2019 14:30:35 -------------------------------------------------------------------------------- Physical Exam Details Patient Name: Date of Service: ASHARI, LLEWELLYN 04/17/2019 1:15 PM Medical Record [id number] Patient Account Number: [id number] Date of Birth/Sex: Treating RN: 1955/05/16 (64 y.o. Elam Dutch Primary Care [name]: Dewayne Hatch Other [name]: Referring [name]: Treating [name]/Extender:Sarah III, Darrick Penna, [name]: 11 Constitutional Obese and well-hydrated in no acute distress. Respiratory normal breathing without difficulty. Psychiatric this patient is able to make decisions and demonstrates good insight into disease process. Alert and Oriented x 3. pleasant and cooperative. Notes Patient states that she had very little drainage today as compared to previous with regard to wearing the cast which is great news hopefully she will  continue to make signs of improvement here. Fortunately she does have a PICC line in place which is also excellent news. Electronic Signature(s) Signed: 04/17/2019 2:30:56 PM By: [name] Entered By: [name] on 04/17/2019 14:30:55 -------------------------------------------------------------------------------- Physician Orders Details Patient Name: Date of Service: [name] 04/17/2019 1:15 PM Medical Record [id number] Patient Account Number: [id number] Date of Birth/Sex: Treating RN: 1955-06-16 (63 y.o. Debara Pickett, Millard.[facility] Primary Care [name]: [name] Other [name]: Referring [name]: Treating [name]/Extender:[name], [name]: 34 Verbal / Phone Orders: No Diagnosis Coding ICD-10  Coding Code Description E11.621 Type 2 diabetes mellitus with foot ulcer L97.524 Non-pressure chronic ulcer of other part of left foot with necrosis of bone L97.511 Non-pressure chronic ulcer of other part of right foot limited to breakdown of skin L97.514 Non-pressure chronic ulcer of other part of right foot with necrosis of bone E11.42 Type 2 diabetes mellitus with diabetic polyneuropathy M14.671 Charcot's joint, right ankle and foot M14.672 Charcot's joint, left ankle and foot 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 - Monday for MD visit Dressing Change Frequency Wound #10 Left,Lateral,Plantar Foot Change Dressing every other day. Wound #9 Right,Medial Foot Do not change entire dressing for one week. Wound Cleansing May shower with protection. Primary Wound Dressing Wound #10 Left,Lateral,Plantar Foot Calcium Alginate with Silver - lightly pack Wound #9 Right,Medial Foot Hydrofera Blue Secondary Dressing Wound #10 Left,Lateral,Plantar Foot Kerlix/Rolled Gauze Dry Gauze Other: - felt callous pad Wound #9 Right,Medial Foot Kerlix/Rolled Gauze Zetuvit or Kerramax Drawtex Carboflex Off-Loading Total Contact Cast to Right Lower Extremity Open toe surgical shoe to: - add felt to sandal also Electronic Signature(s) Signed: 04/17/2019 5:08:19 PM By: Shawn Stall Signed: 04/17/2019 10:08:08 PM By: [name] Entered By: Shawn Stall on 04/17/2019 13:43:48 -------------------------------------------------------------------------------- Problem List Details Patient Name: Date of Service: [name] 04/17/2019 1:15 PM Medical Record [id number] Patient Account Number: [id number] Date of Birth/Sex: Treating RN: Feb 24, 1955 (64 y.o. [name]: [name]: Referring [name]: Treating [name]/Extender:[name],  [name]: 11 Active Problems ICD-10 Evaluated Encounter Code Description Active Date Today Diagnosis E11.621 Type 2 diabetes mellitus with foot ulcer 01/28/2019 No Yes L97.524 Non-pressure chronic ulcer of other part of left foot 01/28/2019 No Yes with necrosis of bone L97.511 Non-pressure chronic ulcer of other part of right foot 01/28/2019 No Yes limited to breakdown of skin L97.514 Non-pressure chronic ulcer of other part of right foot 01/28/2019 No Yes with necrosis of bone E11.42 Type 2 diabetes mellitus with diabetic polyneuropathy 01/28/2019 No Yes M14.671 Charcot's joint, right ankle and foot 01/28/2019 No Yes M14.672 Charcot's joint, left ankle and foot 01/28/2019 No Yes 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 [name]: [name]: Referring [name]: Treating [name]/Extender:[name], [name]: 11 Subjective Chief Complaint Information obtained from Patient Patients presents for follow up of a diabetic ulcer 01/28/2019; patient is here for review of diabetic foot ulcers bilaterally History of Present Illness (HPI) 64 yrs old bf here for follow  up recurrent left charcot foot ulcer. She has DM. She is not a smoker. She states a blister developed 3 months ago at site of previous breakdown from 1 year ago. It was debrided at the podiatrist's office. She went to the ER and then  sent here. She denies pain. Xray was negative for osteo. Culture from this clinic negative. 09/08/14 Referred by [name] to [name] for Charcot foot. Seen by him and MRI scheduled 09/19/14. Prior silver collagen but this was not ordered last visit, patient has been rinsing with saline alone. Re institute silver collagen every other day. F/u 2 weeks with [name] as Labor Day holiday. Supplies ordered. 09/25/14; this is a patient with a Charcot foot on the left. she has a wound over the plantar aspect of her left calcaneus. She has been seen by [name] of orthopedic surgery and has had an MRI. She is apparently going within the next week or 2 for corrective surgery which will involve an external fixator. I don't have any of the details of this. 10/06/14; The patient is a 64 yrs old bf with a left Charcot foot and ulcer on the plantar. 12/01/14 Returns post surgery with [name]. Has follow up visit later this week with him, currently in facility and NWB over this extremity. States no drainage from foot and no current wound care. On exam callus and scab in place, suspect healed. Has external fixator in place. Will defer to [name] for management, ok to place moisturizing cream over scabs and callus and she will call if she requires follow up here. READMISSION 01/28/2019 Patient was in the clinic in 2016 for a wound on the plantar aspect of her left calcaneus. Predominantly cared for by [name] she was referred to [name] and had corrective surgery for the position of her left ankle I believe. She had previously been here in 2015 and then prior to that in 2011 2012 that I do not have these records. More recently she has developed fairly extensive wounds on the right medial foot, left lateral foot and a small area on the right medial great toe. Right medial foot wound has been there for 6 to 7 months, left foot wound for 2 to 3 months and the right great toe only over the last few  weeks. She has been followed by [name] at Dr. Laverta [place] office it sounds as though she has been undergoing callus paring and silver nitrate. The patient has been washing these off with soap and water and plan applying dry dressing Past medical history, type 2 diabetes well controlled with a recent hemoglobin A1c of 7 on 11/16, type 2 diabetes with peripheral neuropathy, previous left Charcot joint surgery bilateral Charcot joints currently, stage III chronic renal failure, hypertension, obstructive sleep apnea, history of MRSA and gastroesophageal reflux. 1/18; this is a patient with bilateral Charcot feet. Plain x-rays that I did of both of these wounds that are either at bone or precariously close to bone were done. On the right foot this showed possible lytic destruction involving the anterior aspect of the talus which may represent a wound osteomyelitis. An MRI was recommended. On the left there was no definite lytic destruction although the wound is deep undermining's and I think probably requires an MRI on this basis in any case 1/25; patient was supposed to have her MRI last Friday of her bilateral feet however they would not do it because there was silver alginate in her dressings,  will replace with calcium alginate today and will see if we can get her done today 2/1; patient did not get her MRIs done last week because of issues with the MRI department receiving orders to do bilateral feet. She has 2 deep wounds in the setting of Charcot feet deformities bilaterally. Both of them at one point had exposed bone although I had trouble demonstrating that today. I am not sure that we can offload these very aggressively as I watched her leave the clinic last week her gait is very narrow and unsteady. 2/15; the MRI of her right foot did not show osteomyelitis potentially osteonecrosis of the talus. 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 The  wounds are a large wound on the right medial foot and on the left a substantial wound on the left lateral plantar calcaneus. We have been using silver alginate. Ultimately I think we need to consider a total contact cast on the right while we work through the possibility of osteomyelitis in the left. I watched her walk out with her crutches I have some trepidation about the ability to walk with the cast on her right foot. Nevertheless with her Charcot deformities I do not think there is a way to heal these short of offloading them aggressively. 2/22; the culture of the bone in the left foot that I did last week showed a few Citrobacter Koseri as well as some streptococcal species. In my attempted bone for pathology did not actually yield a lot of bone the pathologist did not identify any osteomyelitis. 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 Is promised today and aggressive debridement of the right foot and a total contact cast which surprisingly she seemed to tolerate quite well 03/13/2019 upon evaluation today patient appears to be doing well with her total contact cast she is here for the first cast change. She had no areas of rubbing or any other complications at this point. Overall things are doing quite well. 2/26; the patient was kindly seen by Dr. Orvan Falconer of infectious disease on 2/25. She is now on IV vancomycin PICC line placement in 2 days and oral Flagyl. This was in response to her MRI showing osteomyelitis of the left calcaneus concerning for osteomyelitis. We have been putting her right leg in a total contact cast. Our nurses report drainage. She is tolerating the total contact cast well. 3/4; the patient is started her IV antibiotics and is taking IV vancomycin and oral Flagyl. She appears to be tolerating these both well. This is  for osteomyelitis involving the left calcaneus. The area on the right in the setting of bilateral Charcot deformities did not have any bone infection on MRI. We put a total contact cast on her with silver alginate as the primary dressing and she returns today in follow-up. Per our intake nurse there was too much drainage to leave this on all week therefore we will bring her back for an early change 3/8; follow-up total contact cast she is still having a lot of drainage probably too much drainage from the right foot wound will week with the same cast. She will be back on Thursday. The area on the left looks somewhat better 3/11; patient here for a total contact cast change. 3/15; patient came in for wound care evaluation. She  is still on IV vancomycin and oral Flagyl for osteomyelitis in the left foot. The area on the right foot we are putting in a total contact cast. 3/18 for total contact cast change on the right foot 3/22; the patient was here for wound review today. The area on the right foot is slightly smaller in terms of surface area. However the surface of the wound is very gritty and hyper granulated. More problematically the area on the plantar left foot has increased depth indeed in the [name] of this it probes to bone. We have been using Hydrofera Blue in both areas. She is on antibiotics as prescribed by infectious disease IV vancomycin and oral Flagyl 3/25; the patient is in for total contact cast change. Our intake nurse was concerned about the amount of drainage which soaked right to the multiple layers we are putting on this. Wondered about options. The wound bed actually looks as healthy as this is looked although there may not be a lot of change in dimensions things are looking a lot better. If we are going to heal this woman's wound which is in the middle of her right Charcot foot this is going to need to be offloaded. There are not many alternatives 3/29; still not a lot of  improvement although the surface of the right wound looks better. We have been using Hydrofera Blue 04/17/2019 upon evaluation today patient appears to be doing well with a total contact cast. With actually having to change this twice a week. She saw [name] for wound care earlier in the week this is for the cast change today. That is due to the fact that she has significant drainage. Overall the wound is been looking excellent however. Objective Constitutional Obese and well-hydrated in no acute distress. Vitals Time Taken: 1:18 PM, Height: 68 in, Weight: 265 lbs, BMI: 40.3, Temperature: 98.2 F, Pulse: 70 bpm, Respiratory Rate: 18 breaths/min, Blood Pressure: 151/65 mmHg. Respiratory normal breathing without difficulty. Psychiatric this patient is able to make decisions and demonstrates good insight into disease process. Alert and Oriented x 3. pleasant and cooperative. General Notes: Patient states that she had very little drainage today as compared to previous with regard to wearing the cast which is great news hopefully she will continue to make signs of improvement here. Fortunately she does have a PICC line in place which is also excellent news. Integumentary (Hair, Skin) Wound #9 status is Open. Original cause of wound was Blister. The wound is located on the Right,Medial Foot. The wound measures 3.5cm length x 3cm width x 0.3cm depth; 8.247cm^2 area and 2.474cm^3 volume. There is Fat Layer (Subcutaneous Tissue) Exposed exposed. There is no tunneling or undermining noted. There is a large amount of purulent drainage noted. The wound margin is thickened. There is large (67-100%) red, pink, hyper - granulation within the wound bed. There is no necrotic tissue within the wound bed. 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 There  was a Total Contact Cast Procedure by Sarah Kelp, PA. Post procedure Diagnosis Wound #9: Same as Pre-Procedure Plan Follow-up Appointments: Return Appointment in 1 week. - Monday for MD visit Dressing Change Frequency: Wound #10 Left,Lateral,Plantar Foot: Change Dressing every other day. Wound #9 Right,Medial Foot: Do not change entire dressing for one week. Wound Cleansing: May shower with protection. Primary Wound Dressing: Wound #10 Left,Lateral,Plantar Foot: Calcium Alginate with Silver - lightly pack Wound #9 Right,Medial Foot: Hydrofera Blue Secondary Dressing: Wound #10 Left,Lateral,Plantar Foot: Kerlix/Rolled Gauze Dry Gauze Other: - felt callous pad Wound #9 Right,Medial Foot: Kerlix/Rolled Gauze Zetuvit or Kerramax Drawtex Carboflex Off-Loading: Total Contact Cast to Right Lower Extremity Open toe surgical shoe to: - add felt to sandal also 1. My suggestion at this time is good to be that we going continue with a total contact cast that was reapplied by myself here in the office today she tolerated that without complication. 2. We will continue with the [facility] Blue dressing at this point as that seems to be doing well for her. We will see patient back for reevaluation in 1 week here in the clinic. If anything worsens or changes patient will contact our office for additional recommendations. Electronic Signature(s) Signed: 04/17/2019 2:31:32 PM By: [name] Entered By: [name] on 04/17/2019  14:31:31 -------------------------------------------------------------------------------- Total Contact Cast Details Patient Name: Date of Service: CORALIE, STANKE 04/17/2019 1:15 PM Medical Record [id number] Patient Account Number: [id number] Date of Birth/Sex: Treating RN: 06-Aug-1955 (64 y.o. [name] Primary Care [name]: [name] Other [name]: Referring [name]: [name] Treating [name]/Extender:Sarah III, Jake Samples in [name]: 11 Total Contact Cast Applied for Wound Assessment: Wound #9 Right,Medial Foot Performed By: Physician Sarah Kelp, PA Post Procedure Diagnosis Same as Pre-procedure Electronic Signature(s) Signed: 04/17/2019 5:08:19 PM By: Shawn Stall Signed: 04/17/2019 10:08:08 PM By: [name] Entered By: Shawn Stall on 04/17/2019 13:44:35 -------------------------------------------------------------------------------- SuperBill Details Patient Name: Date of Service: [name] 04/17/2019 Medical Record [id number] Patient Account Number: [id number] Date of Birth/Sex: Treating RN: Jul 12, 1955 (63 y.o. Debara Pickett, Millard.Loa Primary Care [name]: [name] Other [name]: Referring [name]: Treating [name]/Extender:[name], [name]: 11 Diagnosis Coding ICD-10 Codes Code Description E11.621 Type 2 diabetes mellitus with foot ulcer L97.524 Non-pressure chronic ulcer of other part of left foot with necrosis of bone L97.511 Non-pressure chronic ulcer of other part of right foot limited to breakdown of skin L97.514 Non-pressure chronic ulcer of other part of right foot with necrosis of bone E11.42 Type 2 diabetes mellitus with diabetic polyneuropathy M14.671 Charcot's joint, right ankle and foot M14.672 Charcot's joint, left ankle and foot 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, left ankle and foot Facility Procedures CPT4 Code Description:  [id number] - APPLY TOTAL CONTACT LEG CAST ICD-10 Diagnosis Description L97.511 Non-pressure chronic ulcer of other part of right foot limit E11.621 Type 2 diabetes mellitus with foot ulcer Modifier: ed to breakdo Quantity: 1 wn of skin Physician Procedures CPT4 Code Description: [id number] - WC PHYS APPLY TOTAL CONTACT CAST ICD-10 Diagnosis Description L97.511 Non-pressure chronic ulcer of other part of right foot limite E11.621 Type 2 diabetes mellitus with foot ulcer Modifier: d to breakdo Quantity: 1 wn of skin Electronic Signature(s) Signed: 04/17/2019 2:31:49 PM By: [name] Entered By: [name] on 04/17/2019 14:31:48

## 2019-04-17 NOTE — Procedures (Signed)
PROCEDURE SUMMARY:  Successful placement of image-guided single lumen PICC line to the right brachial vein. Length 46 cm. Tip at lower SVC/RA. No complications. EBL < 5 mL. Ready for use.  Please see imaging section of Epic for full dictation.   Gordy Councilman Anddy Wingert PA-C 04/17/2019 11:14 AM

## 2019-04-18 ENCOUNTER — Encounter (HOSPITAL_BASED_OUTPATIENT_CLINIC_OR_DEPARTMENT_OTHER): Payer: Medicare Other | Admitting: Internal Medicine

## 2019-04-19 NOTE — Progress Notes (Signed)
TONIANN, DICKERSON ([id number]) Visit Report for 04/15/2019 Arrival Information Details Patient Name: Date of Service: [name], [name] 04/15/2019 1:15 PM Medical Record [id number] Patient Account Number: [id number] Date of Birth/Sex: Treating RN: [birth year] (64 y.o. F) Dwiggins, Shannon Primary Care Jaelin Fackler: Chevis Pretty Other Clinician: Referring Oneill Bais: Treating Syleena Mchan/Extender:Robson, [name], [facility] Weeks in Treatment: 11 Visit Information History Since Last Visit Added or deleted any medications: No Patient Arrived: Crutches Any new allergies or adverse reactions: No Arrival Time: 13:23 Had a fall or experienced change in No Accompanied By: self activities of daily living that may affect Transfer Assistance: None risk of falls: Patient Identification Verified: Yes Signs or symptoms of abuse/neglect since No Secondary Verification Process Completed: Yes last visito Patient Requires Transmission-Based No Hospitalized since last visit: No Precautions: Implantable device outside of the clinic No Patient Has Alerts: No excluding cellular tissue based products placed in the center since last visit: Has Dressing in Place as Prescribed: Yes Has Footwear/Offloading in Place as Yes Prescribed: Right: Total Contact Cast Pain Present Now: No Electronic Signature(s) Signed: 04/15/2019 5:06:29 PM By: [name] Entered By: [name] on 04/15/2019 13:23:56 -------------------------------------------------------------------------------- Encounter Discharge Information Details Patient Name: Date of Service: [name] 04/15/2019 1:15 PM Medical Record [id number] Patient Account Number: [id number] Date of Birth/Sex: Treating RN: Feb 25, 1955 (64 y.o. [name] Primary Care Lindamarie Maclachlan: Chevis Pretty Other Clinician: Referring Annai Heick: Treating Sheniece Ruggles/Extender:Robson, [name],  [facility] Weeks in Treatment: 11 Encounter Discharge Information Items Post Procedure Vitals Discharge Condition: Stable Temperature (F): 98 Ambulatory Status: Crutches Pulse (bpm): 67 Discharge Destination: Home Respiratory Rate (breaths/min): 18 Transportation: Other Blood Pressure (mmHg): 143/60 Accompanied By: self Schedule Follow-up Appointment: Yes Clinical Summary of Care: Patient Declined Electronic Signature(s) Signed: 04/15/2019 5:06:29 PM By: [name] Entered By: [name] on 04/15/2019 14:47:51 -------------------------------------------------------------------------------- Lower Extremity Assessment Details Patient Name: Date of Service: BAYLEI, SIEBELS 04/15/2019 1:15 PM Medical Record [id number] Patient Account Number: [id number] Date of Birth/Sex: Treating RN: [birth year] (63 y.o. [name] Primary Care Oliverio Cho: Chevis Pretty Other Clinician: Referring Leeandra Ellerson: Treating Doll Frazee/Extender:Robson, [name], [facility] Weeks in Treatment: 11 Edema Assessment Assessed: [Left: No] [Right: No] Edema: [Left: Yes] [Right: Yes] Calf Left: Right: Point of Measurement: 43 cm From Medial Instep 37 cm 40.5 cm Ankle Left: Right: Point of Measurement: 12 cm From Medial Instep 24.5 cm 28 cm Vascular Assessment Pulses: Dorsalis Pedis Palpable: [Left:Yes] [Right:Yes] Electronic Signature(s) Signed: 04/15/2019 5:06:29 PM By: [name] Entered By: [name] on 04/15/2019 13:39:21 -------------------------------------------------------------------------------- Multi Wound Chart Details Patient Name: Date of Service: [name] 04/15/2019 1:15 PM Medical Record [id number] Patient Account Number: [id number] Date of Birth/Sex: Treating RN: 02-24-1955 (64 y.o. [name] Primary Care Mindy Gali: Chevis Pretty Other Clinician: Referring Dyani Babel: Treating  Abou Sterkel/Extender:Robson, [name], [facility] Weeks in Treatment: 11 Vital Signs Height(in): 68 Pulse(bpm): 67 Weight(lbs): 265 Blood Pressure(mmHg): 143/60 Body Mass Index(BMI): 40 Temperature(F): 98 Respiratory 18 Rate(breaths/min): Photos: [10:No Photos] [9:No Photos] [N/A:N/A] Wound Location: [10:Left Foot - Plantar, Lateral Right Foot - Medial] [N/A:N/A] Wounding Event: [10:Blister] [9:Blister] [N/A:N/A] Primary Etiology: [10:Diabetic Wound/Ulcer of the Diabetic Wound/Ulcer of the N/A Lower Extremity] [9:Lower Extremity] Comorbid History: [10:Glaucoma, Chronic sinus Glaucoma, Chronic sinus N/A problems/congestion, Anemia, Sleep Apnea, Hypertension, Type II Diabetes, Rheumatoid Arthritis, Neuropathy] [9:problems/congestion, Anemia, Sleep Apnea, Hypertension, Type II  Diabetes, Rheumatoid Arthritis, Neuropathy] Date Acquired: [10:11/18/2018] [9:06/18/2018] [N/A:N/A] Weeks of Treatment: [10:11] [9:11] [N/A:N/A] Wound Status: [10:Open] [9:Open] [N/A:N/A] Measurements L x W x D 1.5x1.7x1.8 [9:3.5x3x0.3] [N/A:N/A] (cm) Area (cm) : [  10:2.003] [9:8.247] [N/A:N/A] Volume (cm) : [10:3.605] [9:2.474] [N/A:N/A] % Reduction in Area: [10:-13.90%] [9:25.00%] [N/A:N/A] % Reduction in Volume: -36.60% [9:75.00%] [N/A:N/A] Classification: [10:Grade 2] [9:Grade 2] [N/A:N/A] Exudate Amount: [10:Medium] [9:Large] [N/A:N/A] Exudate Type: [10:Serosanguineous] [9:Purulent] [N/A:N/A] Exudate Color: [10:red, brown] [9:yellow, brown, green] [N/A:N/A] Wound Margin: [10:Thickened] [9:Thickened] [N/A:N/A] Granulation Amount: [10:Large (67-100%)] [9:Large (67-100%)] [N/A:N/A] Granulation Quality: [10:Red, Pink] [9:Red, Pink, Hyper- granulation] [N/A:N/A] Necrotic Amount: [10:None Present (0%)] [9:None Present (0%)] [N/A:N/A] Exposed Structures: [10:Fat Layer (Subcutaneous Fat Layer (Subcutaneous N/A Tissue) Exposed: Yes Muscle: Yes Bone: Yes Fascia: No Tendon: No Joint: No] [9:Tissue) Exposed:  Yes Fascia: No Tendon: No Muscle: No Joint: No Bone: No] Epithelialization: [10:None] [9:Small (1-33%)] [N/A:N/A] Debridement: [10:Debridement - Excisional] [9:Debridement - Excisional] [N/A:N/A] Pre-procedure [10:14:00] [9:14:00] [N/A:N/A] Verification/Time Out Taken: Pain Control: [10:Other] [9:Other] [N/A:N/A] Tissue Debrided: [10:Callus, Subcutaneous, Slough] [9:Callus, Subcutaneous] [N/A:N/A] Level: [10:Skin/Subcutaneous Tissue Skin/Subcutaneous Tissue] [N/A:N/A] Debridement Area (sq cm):2.55 [9:10.5] [N/A:N/A] Instrument: [10:Blade, Curette] [9:Blade, Curette] [N/A:N/A] Bleeding: [10:Minimum] [9:Minimum] [N/A:N/A] Hemostasis Achieved: [10:Pressure] [9:Pressure] [N/A:N/A] Procedural Pain: [10:0] [9:0] [N/A:N/A] Post Procedural Pain: [10:0] [9:0] [N/A:N/A] Debridement Treatment Procedure was tolerated [9:Procedure was tolerated] [N/A:N/A] Response: [10:well] [9:well] Post Debridement [10:1.5x1.7x1.8] [9:3.5x3x0.3] [N/A:N/A] Measurements L x W x D (cm) Post Debridement [10:3.605] [9:2.474] [N/A:N/A] Volume: (cm) Assessment Notes: [10:callus and maceration noted callous and maceration to to periwound.] [9:periwound] [N/A:N/A] Procedures Performed: Debridement [9:Debridement Total Contact Cast] [N/A:N/A] Treatment Notes Wound #10 (Left, Lateral, Plantar Foot) 1. Cleanse With Wound Cleanser 3. Primary Dressing Applied Calcium Alginate Ag 4. Secondary Dressing Dry Gauze Roll Gauze 5. Secured With Tape 7. Footwear/Offloading device applied Felt/Foam Notes x2 felt. stockinette Wound #9 (Right, Medial Foot) 1. Cleanse With Wound Cleanser Soap and water 3. Primary Dressing Applied Hydrofera Blue 4. Secondary Dressing Roll Gauze Kerramax/Xtrasorb Drawtex 7. Footwear/Offloading device applied Total Contact Cast Notes carboflex. TCC applied by MD. Electronic Signature(s) Signed: 04/15/2019 5:31:36 PM By: Baltazar Najjar MD Signed: 04/19/2019 5:28:43 PM By: Zandra Abts  RN, BSN Entered By: Baltazar Najjar on 04/15/2019 15:04:22 -------------------------------------------------------------------------------- Multi-Disciplinary Care Plan Details Patient Name: Date of Service: [facility] 04/15/2019 1:15 PM Medical Record [id number] Patient Account Number: [id number] Date of Birth/Sex: Treating RN: 1955-03-28 (63 y.o. [name] Primary Care Braylyn Eye: Chevis Pretty Other Clinician: Referring Terran Hollenkamp: Treating Taqwa Deem/Extender:Robson, [name], [facility] Weeks in Treatment: 11 Active Inactive Wound/Skin Impairment Nursing Diagnoses: Impaired tissue integrity Knowledge deficit related to ulceration/compromised skin integrity Goals: Patient/caregiver will verbalize understanding of skin care regimen Date Initiated: 01/28/2019 Target Resolution Date: 05/16/2019 Goal Status: Active Interventions: Assess patient/caregiver ability to obtain necessary supplies Assess patient/caregiver ability to perform ulcer/skin care regimen upon admission and as needed Assess ulceration(s) every visit Provide education on ulcer and skin care Notes: Electronic Signature(s) Signed: 04/15/2019 5:31:53 PM By: Zenaida Deed RN, BSN Entered By: Zenaida Deed on 04/15/2019 14:12:21 -------------------------------------------------------------------------------- Pain Assessment Details Patient Name: Date of Service: [name] 04/15/2019 1:15 PM Medical Record [id number] Patient Account Number: [id number] Date of Birth/Sex: Treating RN: 10/27/1955 (63 y.o. [name] Primary Care Nancye Grumbine: Other Clinician: Chevis Pretty Referring Vonte Rossin: Treating Kabrea Seeney/Extender:Robson, [name], [facility] Weeks in Treatment: 11 Active Problems Location of Pain Severity and Description of Pain Patient Has Paino Yes Site Locations Rate the pain. Current Pain Level: 4 Worst Pain Level: 10 Least  Pain Level: 0 Tolerable Pain Level: 8 Pain Management and Medication Current Pain Management: Medication: No Cold Application: No Rest: No Massage: No Activity: No T.E.N.S.: No Heat Application: No Leg drop or elevation: No Is the Current Pain  Management Adequate: Adequate How does your wound impact your activities of daily livingo Sleep: No Bathing: No Appetite: No Relationship With Others: No Bladder Continence: No Emotions: No Bowel Continence: No Work: No Toileting: No Drive: No Dressing: No Hobbies: No Electronic Signature(s) Signed: 04/15/2019 5:06:29 PM By: Kela Millin Entered By: Kela Millin on 04/15/2019 13:28:33 -------------------------------------------------------------------------------- Patient/Caregiver Education Details Patient Name: [name] 3/29/2021andnbsp1:15 Date of Service: PM Medical Record [id number] Number: Patient Account Number: [id number] Date of Birth/Gender: [birth year] (63 y.o. F) Treating RN: Baruch Gouty Primary Care Other Clinician: Myrtie Hawk, Physician: Lidia Collum Physician/ExtenderMyrtie Hawk, Referring Physician: Dorthula Rue Referring Physician: Tenna Delaine in Treatment: 11 Education Assessment Education Provided To: Patient Education Topics Provided Offloading: Methods: Explain/Verbal Responses: Reinforcements needed, State content correctly Wound/Skin Impairment: Methods: Explain/Verbal Responses: Reinforcements needed, State content correctly Electronic Signature(s) Signed: 04/15/2019 5:31:53 PM By: Baruch Gouty RN, BSN Entered By: Baruch Gouty on 04/15/2019 14:12:48 -------------------------------------------------------------------------------- Wound Assessment Details Patient Name: Date of Service: [name] 04/15/2019 1:15 PM Medical Record [id number] Patient Account Number: [id number] Date of Birth/Sex: Treating RN: Nov 25, 1955 (64 y.o.  Clearnce Sorrel Primary Care Kenney Going: Dewayne Hatch Other Clinician: Referring Bluma Buresh: Treating Marcelyn Ruppe/Extender:Robson, [name], [facility] Weeks in Treatment: 11 Wound Status Wound Number: 10 Primary Diabetic Wound/Ulcer of the Lower Extremity Etiology: Wound Location: Left Foot - Plantar, Lateral Wound Open Wounding Event: Blister Status: Date Acquired: 11/18/2018 Comorbid Glaucoma, Chronic sinus Weeks Of Treatment: 11 History: problems/congestion, Anemia, Sleep Apnea, Clustered Wound: No Hypertension, Type II Diabetes, Rheumatoid Arthritis, Neuropathy Photos Photo Uploaded By: Mikeal Hawthorne on 04/19/2019 15:41:50 Wound Measurements Length: (cm) 1.5 Width: (cm) 1.7 Depth: (cm) 1.8 Area: (cm) 2.003 Volume: (cm) 3.605 Wound Description Classification: Grade 2 Wound Margin: Thickened Exudate Amount: Medium Exudate Type: Serosanguineous Exudate Color: red, brown Wound Bed Granulation Amount: Large (67-100%) Granulation Quality: Red, Pink Necrotic Amount: None Present (0%) After Cleansing: No brino No Exposed Structure posed: No (Subcutaneous Tissue) Exposed: Yes posed: No posed: Yes osis of Muscle: No osed: No sed: Yes % Reduction in Area: -13.9% % Reduction in Volume: -36.6% Epithelialization: None Tunneling: No Undermining: No Foul Odor Slough/Fi Fascia Ex Fat Layer Tendon Ex Muscle Ex Necr Joint Exp Bone Expo Assessment Notes callus and maceration noted to periwound. Treatment Notes Wound #10 (Left, Lateral, Plantar Foot) 1. Cleanse With Wound Cleanser 3. Primary Dressing Applied Calcium Alginate Ag 4. Secondary Dressing Dry Gauze Roll Gauze 5. Secured With Tape 7. Footwear/Offloading device applied Felt/Foam Notes x2 felt. stockinette Electronic Signature(s) Signed: 04/15/2019 5:06:29 PM By: Kela Millin Entered By: Kela Millin on 04/15/2019  13:38:18 -------------------------------------------------------------------------------- Wound Assessment Details Patient Name: Date of Service: RIM, THATCH 04/15/2019 1:15 PM Medical Record [id number] Patient Account Number: [id number] Date of Birth/Sex: Treating RN: 04/29/1955 (63 y.o. Clearnce Sorrel Primary Care Chistopher Mangino: Other Clinician: Dewayne Hatch Referring Samary Shatz: Treating Lalonnie Shaffer/Extender:Robson, [name], [facility] Weeks in Treatment: 11 Wound Status Wound Number: 9 Primary Diabetic Wound/Ulcer of the Lower Extremity Etiology: Wound Location: Right Foot - Medial Wound Open Wounding Event: Blister Status: Date Acquired: 06/18/2018 Comorbid Glaucoma, Chronic sinus Weeks Of Treatment: 11 History: problems/congestion, Anemia, Sleep Apnea, Clustered Wound: No Hypertension, Type II Diabetes, Rheumatoid Arthritis, Neuropathy Photos Photo Uploaded By: Mikeal Hawthorne on 04/19/2019 15:41:51 Wound Measurements Length: (cm) 3.5 % Reduction Width: (cm) 3 % Reduction Depth: (cm) 0.3 Epitheliali Area: (cm) 8.247 Tunneling: Volume: (cm) 2.474 Underminin Wound Description Classification: Grade 2 Foul Odor Wound Margin: Thickened Slough/Fib Exudate Amount: Large Exudate Type: Purulent Exudate Color: yellow, brown, green  Wound Bed Granulation Amount: Large (67-100%) Granulation Quality: Red, Pink, Hyper-granulation Fascia Exp Necrotic Amount: None Present (0%) Fat Layer Tendon Exp Muscle Exp Joint Expo Bone Expos After Cleansing: No rino No Exposed Structure osed: No (Subcutaneous Tissue) Exposed: Yes osed: No osed: No sed: No ed: No in Area: 25% in Volume: 75% zation: Small (1-33%) No g: No Assessment Notes callous and maceration to periwound Electronic Signature(s) Signed: 04/15/2019 5:06:29 PM By: [name] Entered By: [name] on 04/15/2019  13:38:41 -------------------------------------------------------------------------------- Vitals Details Patient Name: Date of Service: [name] 04/15/2019 1:15 PM Medical Record [id number] Patient Account Number: [id number] Date of Birth/Sex: Treating RN: 1955/04/29 (64 y.o. [name] Primary Care Marialice Newkirk: Chevis Pretty Other Clinician: Referring Blair Mesina: Treating Gustavo Dispenza/Extender:Robson, [name], [facility] Weeks in Treatment: 11 Vital Signs Time Taken: 13:25 Temperature (F): 98 Height (in): 68 Pulse (bpm): 67 Weight (lbs): 265 Respiratory Rate (breaths/min): 18 Body Mass Index (BMI): 40.3 Blood Pressure (mmHg): 143/60 Reference Range: 80 - 120 mg / dl Electronic Signature(s) Signed: 04/15/2019 5:06:29 PM By: [name] Entered By: [name] on 04/15/2019 13:26:01

## 2019-04-19 NOTE — Progress Notes (Signed)
[name], [name] ([id number]) Visit Report for 04/15/2019 Debridement Details Patient Name: Date of Service: [name], [name] 04/15/2019 1:15 PM Medical Record [id number] Patient Account Number: [id number] Date of Birth/Sex: Treating RN: May 27, 1955 (64 y.o. [name] Primary Care Provider: Chevis Pretty Other Clinician: Referring Provider: Chevis Pretty Treating Provider/Extender:Jayde Mcallister, Lamar Sprinkles in Treatment: 11 Debridement Performed for Wound #10 Left,Lateral,Plantar Foot Assessment: Performed By: Physician [name], MD Debridement Type: Debridement Severity of Tissue Pre Fat layer exposed Debridement: Level of Consciousness (Pre- Awake and Alert procedure): Pre-procedure Verification/Time Out Taken: Yes - 14:00 Start Time: 14:01 Pain Control: Other : benzocaine 20% spray Total Area Debrided (L x W): 1.5 (cm) x 1.7 (cm) = 2.55 (cm) Tissue and other material Viable, Non-Viable, Callus, Slough, Subcutaneous, Skin: Epidermis, Slough debrided: Level: Skin/Subcutaneous Tissue Debridement Description: Excisional Instrument: Blade, Curette Bleeding: Minimum Hemostasis Achieved: Pressure End Time: 14:18 Procedural Pain: 0 Post Procedural Pain: 0 Response to Treatment: Procedure was tolerated well Level of Consciousness Awake and Alert (Post-procedure): Post Debridement Measurements of Total Wound Length: (cm) 1.5 Width: (cm) 1.7 Depth: (cm) 1.8 Volume: (cm) 3.605 Character of Wound/Ulcer Post Improved Debridement: Severity of Tissue Post Debridement: Fat layer exposed Post Procedure Diagnosis Same as Pre-procedure Electronic Signature(s) Signed: 04/15/2019 5:31:36 PM By: Baltazar Najjar MD Signed: 04/19/2019 5:28:43 PM By: Zandra Abts RN, BSN Entered By: Baltazar Najjar on 04/15/2019 15:04:35 -------------------------------------------------------------------------------- Debridement Details Patient Name: Date of  Service: Sarah Jaeger. 04/15/2019 1:15 PM Medical Record [id number] Patient Account Number: [id number] Date of Birth/Sex: Treating RN: 1955/06/21 (64 y.o. [name] Primary Care Provider: Chevis Pretty Other Clinician: Referring Provider: Chevis Pretty Treating Provider/Extender:Youssef Footman, Lamar Sprinkles in Treatment: 11 Debridement Performed for Wound #9 Right,Medial Foot Assessment: Performed By: Physician [name], MD Debridement Type: Debridement Severity of Tissue Pre Fat layer exposed Debridement: Level of Consciousness (Pre- Awake and Alert procedure): Pre-procedure Yes - 14:00 Verification/Time Out Taken: Start Time: 14:01 Pain Control: Other : benzocaine 20% spray Total Area Debrided (L x W): 3.5 (cm) x 3 (cm) = 10.5 (cm) Tissue and other material Viable, Non-Viable, Callus, Subcutaneous, Skin: Epidermis debrided: Level: Skin/Subcutaneous Tissue Debridement Description: Excisional Instrument: Blade, Curette Bleeding: Minimum Hemostasis Achieved: Pressure End Time: 14:18 Procedural Pain: 0 Post Procedural Pain: 0 Response to Treatment: Procedure was tolerated well Level of Consciousness Awake and Alert (Post-procedure): Post Debridement Measurements of Total Wound Length: (cm) 3.5 Width: (cm) 3 Depth: (cm) 0.3 Volume: (cm) 2.474 Character of Wound/Ulcer Post Improved Debridement: Severity of Tissue Post Debridement: Fat layer exposed Post Procedure Diagnosis Same as Pre-procedure Electronic Signature(s) Signed: 04/15/2019 5:31:36 PM By: Baltazar Najjar MD Signed: 04/19/2019 5:28:43 PM By: Zandra Abts RN, BSN Entered By: Baltazar Najjar on 04/15/2019 15:04:45 -------------------------------------------------------------------------------- HPI Details Patient Name: Date of Service: Sarah Jaeger. 04/15/2019 1:15 PM Medical Record [id number] Patient Account Number: [id number] Date of  Birth/Sex: Treating RN: [birth year] (64 y.o. [name] Primary Care Provider: Chevis Pretty Other Clinician: Referring Provider: Treating Provider/Extender:Venus Gilles, [name], [facility] Weeks in Treatment: 11 History of Present Illness HPI Description: 64 yrs old bf here for follow up recurrent left charcot foot ulcer. She has DM. She is not a smoker. She states a blister developed 3 months ago at site of previous breakdown from 1 year ago. It was debrided at the podiatrist's office. She went to the ER and then sent here. She denies pain. Xray was negative for osteo. Culture from this clinic negative. 09/08/14 Referred by [name] to [name] for Charcot  foot. Seen by him and MRI scheduled 09/19/14. Prior silver collagen but this was not ordered last visit, patient has been rinsing with saline alone. Re institute silver collagen every other day. F/u 2 weeks with [name] as Labor Day holiday. Supplies ordered. 09/25/14; this is a patient with a Charcot foot on the left. she has a wound over the plantar aspect of her left calcaneus. She has been seen by [name] of orthopedic surgery and has had an MRI. She is apparently going within the next week or 2 for corrective surgery which will involve an external fixator. I don't have any of the details of this. 10/06/14; The patient is a 64 yrs old bf with a left Charcot foot and ulcer on the plantar. 12/01/14 Returns post surgery with [name]. Has follow up visit later this week with him, currently in facility and NWB over this extremity. States no drainage from foot and no current wound care. On exam callus and scab in place, suspect healed. Has external fixator in place. Will defer to [name] for management, ok to place moisturizing cream over scabs and callus and she will call if she requires follow up here. READMISSION 01/28/2019 Patient was in the clinic in 2016 for a wound on the plantar aspect of her left  calcaneus. Predominantly cared for by [name] she was referred to [name] and had corrective surgery for the position of her left ankle I believe. She had previously been here in 2015 and then prior to that in 2011 2012 that I do not have these records. More recently she has developed fairly extensive wounds on the right medial foot, left lateral foot and a small area on the right medial great toe. Right medial foot wound has been there for 6 to 7 months, left foot wound for 2 to 3 months and the right great toe only over the last few weeks. She has been followed by [name] at Dr. Laverta [place] office it sounds as though she has been undergoing callus paring and silver nitrate. The patient has been washing these off with soap and water and plan applying dry dressing Past medical history, type 2 diabetes well controlled with a recent hemoglobin A1c of 7 on 11/16, type 2 diabetes with peripheral neuropathy, previous left Charcot joint surgery bilateral Charcot joints currently, stage III chronic renal failure, hypertension, obstructive sleep apnea, history of MRSA and gastroesophageal reflux. 1/18; this is a patient with bilateral Charcot feet. Plain x-rays that I did of both of these wounds that are either at bone or precariously close to bone were done. On the right foot this showed possible lytic destruction involving the anterior aspect of the talus which may represent a wound osteomyelitis. An MRI was recommended. On the left there was no definite lytic destruction although the wound is deep undermining's and I think probably requires an MRI on this basis in any case 1/25; patient was supposed to have her MRI last Friday of her bilateral feet however they would not do it because there was silver alginate in her dressings, will replace with calcium alginate today and will see if we can get her done today 2/1; patient did not get her MRIs done last week because of issues with the MRI  department receiving orders to do bilateral feet. She has 2 deep wounds in the setting of Charcot feet deformities bilaterally. Both of them at one point had exposed bone although I had trouble demonstrating that today. I am  not sure that we can offload these very aggressively as I watched her leave the clinic last week her gait is very narrow and unsteady. 2/15; the MRI of her right foot did not show osteomyelitis potentially osteonecrosis of the talus. 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 The wounds are a large wound on the right medial foot and on the left a substantial wound on the left lateral plantar calcaneus. We have been using silver alginate. Ultimately I think we need to consider a total contact cast on the right while we work through the possibility of osteomyelitis in the left. I watched her walk out with her crutches I have some trepidation about the ability to walk with the cast on her right foot. Nevertheless with her Charcot deformities I do not think there is a way to heal these short of offloading them aggressively. 2/22; the culture of the bone in the left foot that I did last week showed a few Citrobacter Koseri as well as some streptococcal species. In my attempted bone for pathology did not actually yield a lot of bone the pathologist did not identify any osteomyelitis. 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 Is promised today and aggressive debridement of the right foot and a total contact cast which surprisingly she seemed to tolerate quite well 03/13/2019 upon evaluation today patient appears to be doing well with her total contact cast she is here for the first cast change. She had no areas of rubbing or any other complications at this point. Overall things are doing  quite well. 2/26; the patient was kindly seen by Dr. Orvan Falconer of infectious disease on 2/25. She is now on IV vancomycin PICC line placement in 2 days and oral Flagyl. This was in response to her MRI showing osteomyelitis of the left calcaneus concerning for osteomyelitis. We have been putting her right leg in a total contact cast. Our nurses report drainage. She is tolerating the total contact cast well. 3/4; the patient is started her IV antibiotics and is taking IV vancomycin and oral Flagyl. She appears to be tolerating these both well. This is for osteomyelitis involving the left calcaneus. The area on the right in the setting of bilateral Charcot deformities did not have any bone infection on MRI. We put a total contact cast on her with silver alginate as the primary dressing and she returns today in follow-up. Per our intake nurse there was too much drainage to leave this on all week therefore we will bring her back for an early change 3/8; follow-up total contact cast she is still having a lot of drainage probably too much drainage from the right foot wound will week with the same cast. She will be back on Thursday. The area on the left looks somewhat better 3/11; patient here for a total contact cast change. 3/15; patient came in for wound care evaluation. She is still on IV vancomycin and oral Flagyl for osteomyelitis in the left foot. The area on the right foot we are putting in a total contact cast. 3/18 for total contact cast change on the right foot 3/22; the patient was here for wound review today. The area on the right foot is slightly smaller in terms of surface area. However the surface of the wound is very gritty and  hyper granulated. More problematically the area on the plantar left foot has increased depth indeed in the center of this it probes to bone. We have been using Hydrofera Blue in both areas. She is on antibiotics as prescribed by infectious disease IV vancomycin  and oral Flagyl 3/25; the patient is in for total contact cast change. Our intake nurse was concerned about the amount of drainage which soaked right to the multiple layers we are putting on this. Wondered about options. The wound bed actually looks as healthy as this is looked although there may not be a lot of change in dimensions things are looking a lot better. If we are going to heal this woman's wound which is in the middle of her right Charcot foot this is going to need to be offloaded. There are not many alternatives 3/29; still not a lot of improvement although the surface of the right wound looks better. We have been using Hydrofera Blue Electronic Signature(s) Signed: 04/15/2019 5:31:36 PM By: Baltazar Najjar MD Entered By: Baltazar Najjar on 04/15/2019 15:05:24 -------------------------------------------------------------------------------- Physical Exam Details Patient Name: Date of Service: [name], [name] 04/15/2019 1:15 PM Medical Record [id number] Patient Account Number: [id number] Date of Birth/Sex: Treating RN: September 22, 1955 (63 y.o. [name] Primary Care Provider: Chevis Pretty Other Clinician: Referring Provider: Treating Provider/Extender:Fiore Detjen, [name], [facility] Weeks in Treatment: 11 Constitutional Patient is hypertensive.. Pulse regular and within target range for patient.Marland Kitchen Respirations regular, non-labored and within target range.. Temperature is normal and within the target range for the patient.Marland Kitchen Appears in no distress. Notes Wound exam; the area on the right foot was debrided with a #5 curette of skin subcutaneous tissue around the margin and then a #10 scalpel of hyper granulated tissue from the wound itself hemostasis with silver nitrate and pressure dressing On the left #5 curette skin and subcutaneous tissue from the wound margins Electronic Signature(s) Signed: 04/15/2019 5:31:36 PM By: Baltazar Najjar MD Entered By:  Baltazar Najjar on 04/15/2019 15:06:15 -------------------------------------------------------------------------------- Physician Orders Details Patient Name: Date of Service: Sarah Jaeger 04/15/2019 1:15 PM Medical Record [id number] Patient Account Number: [id number] Date of Birth/Sex: Treating RN: 1955-03-23 (64 y.o. [name] Primary Care Provider: Chevis Pretty Other Clinician: Referring Provider: Treating Provider/Extender:Reyna Lorenzi, [name], [facility] Weeks in Treatment: 20 Verbal / Phone Orders: No Diagnosis Coding ICD-10 Coding Code Description E11.621 Type 2 diabetes mellitus with foot ulcer L97.524 Non-pressure chronic ulcer of other part of left foot with necrosis of bone L97.511 Non-pressure chronic ulcer of other part of right foot limited to breakdown of skin L97.514 Non-pressure chronic ulcer of other part of right foot with necrosis of bone E11.42 Type 2 diabetes mellitus with diabetic polyneuropathy M14.671 Charcot's joint, right ankle and foot M14.672 Charcot's joint, left ankle and foot 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 - Monday for MD visit Return Appointment in: - Wednesday or Friday. for cast change Dressing Change Frequency Wound #10 Left,Lateral,Plantar Foot Change Dressing every other day. Wound #9 Right,Medial Foot Do not change entire dressing for one week. Wound Cleansing May shower with protection. Primary Wound Dressing Wound #10 Left,Lateral,Plantar Foot Calcium Alginate with Silver - lightly pack Wound #9 Right,Medial Foot Hydrofera Blue Secondary Dressing Wound #10 Left,Lateral,Plantar Foot Kerlix/Rolled Gauze Dry Gauze Other: - felt callous pad Wound #9 Right,Medial Foot Kerlix/Rolled Gauze Zetuvit or Kerramax Drawtex Carboflex Off-Loading Total Contact Cast to Right Lower Extremity Open toe surgical shoe to: - add felt  to sandal  also Electronic Signature(s) Signed: 04/15/2019 5:31:36 PM By: Baltazar Najjar MD Signed: 04/15/2019 5:31:53 PM By: Zenaida Deed RN, BSN Entered By: Zenaida Deed on 04/15/2019 14:19:45 -------------------------------------------------------------------------------- Problem List Details Patient Name: Date of Service: [facility] 04/15/2019 1:15 PM Medical Record [id number] Patient Account Number: [id number] Date of Birth/Sex: Treating RN: [birth year] (63 y.o. [name] Primary Care Provider: Chevis Pretty Other Clinician: Referring Provider: Treating Provider/Extender:Tashena Ibach, [name], [facility] Weeks in Treatment: 11 Active Problems ICD-10 Evaluated Encounter Code Description Active Date Today Diagnosis E11.621 Type 2 diabetes mellitus with foot ulcer 01/28/2019 No Yes L97.524 Non-pressure chronic ulcer of other part of left foot 01/28/2019 No Yes with necrosis of bone L97.511 Non-pressure chronic ulcer of other part of right foot 01/28/2019 No Yes limited to breakdown of skin L97.514 Non-pressure chronic ulcer of other part of right foot 01/28/2019 No Yes with necrosis of bone E11.42 Type 2 diabetes mellitus with diabetic polyneuropathy 01/28/2019 No Yes M14.671 Charcot's joint, right ankle and foot 01/28/2019 No Yes M14.672 Charcot's joint, left ankle and foot 01/28/2019 No Yes 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 04/15/2019 1:15 PM Medical Record [id number] Patient Account Number: [id number] Date of Birth/Sex: Treating RN: 03/02/1955 (64 y.o. [name] Primary Care  Provider: Chevis Pretty Other Clinician: Referring Provider: Chevis Pretty Treating Provider/Extender:Shekina Cordell, Lamar Sprinkles in Treatment: 11 Subjective History of Present Illness (HPI) 64 yrs old bf here for follow up recurrent left charcot foot ulcer. She has DM. She is not a smoker. She states a blister developed 3 months ago at site of previous breakdown from 1 year ago. It was debrided at the podiatrist's office. She went to the ER and then sent here. She denies pain. Xray was negative for osteo. Culture from this clinic negative. 09/08/14 Referred by [name] to [name] for Charcot foot. Seen by him and MRI scheduled 09/19/14. Prior silver collagen but this was not ordered last visit, patient has been rinsing with saline alone. Re institute silver collagen every other day. F/u 2 weeks with [name] as Labor Day holiday. Supplies ordered. 09/25/14; this is a patient with a Charcot foot on the left. she has a wound over the plantar aspect of her left calcaneus. She has been seen by [name] of orthopedic surgery and has had an MRI. She is apparently going within the next week or 2 for corrective surgery which will involve an external fixator. I don't have any of the details of this. 10/06/14; The patient is a 64 yrs old bf with a left Charcot foot and ulcer on the plantar. 12/01/14 Returns post surgery with [name]. Has follow up visit later this week with him, currently in facility and NWB over this extremity. States no drainage from foot and no current wound care. On exam callus and scab in place, suspect healed. Has external fixator in place. Will defer to [name] for management, ok to place moisturizing cream over scabs and callus and she will call if she requires follow up here. READMISSION 01/28/2019 Patient was in the clinic in 2016 for a wound on the plantar aspect of her left calcaneus. Predominantly cared for by [name] she was referred to Dr.  [name] and had corrective surgery for the position of her left ankle I believe.  She had previously been here in 2015 and then prior to that in 2011 2012 that I do not have these records. More recently she has developed fairly extensive wounds on the right medial foot, left lateral foot and a small area on the right medial great toe. Right medial foot wound has been there for 6 to 7 months, left foot wound for 2 to 3 months and the right great toe only over the last few weeks. She has been followed by [name] at Dr. Laverta [place] office it sounds as though she has been undergoing callus paring and silver nitrate. The patient has been washing these off with soap and water and plan applying dry dressing Past medical history, type 2 diabetes well controlled with a recent hemoglobin A1c of 7 on 11/16, type 2 diabetes with peripheral neuropathy, previous left Charcot joint surgery bilateral Charcot joints currently, stage III chronic renal failure, hypertension, obstructive sleep apnea, history of MRSA and gastroesophageal reflux. 1/18; this is a patient with bilateral Charcot feet. Plain x-rays that I did of both of these wounds that are either at bone or precariously close to bone were done. On the right foot this showed possible lytic destruction involving the anterior aspect of the talus which may represent a wound osteomyelitis. An MRI was recommended. On the left there was no definite lytic destruction although the wound is deep undermining's and I think probably requires an MRI on this basis in any case 1/25; patient was supposed to have her MRI last Friday of her bilateral feet however they would not do it because there was silver alginate in her dressings, will replace with calcium alginate today and will see if we can get her done today 2/1; patient did not get her MRIs done last week because of issues with the MRI department receiving orders to do bilateral feet. She has 2 deep wounds in the  setting of Charcot feet deformities bilaterally. Both of them at one point had exposed bone although I had trouble demonstrating that today. I am not sure that we can offload these very aggressively as I watched her leave the clinic last week her gait is very narrow and unsteady. 2/15; the MRI of her right foot did not show osteomyelitis potentially osteonecrosis of the talus. 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 The wounds are a large wound on the right medial foot and on the left a substantial wound on the left lateral plantar calcaneus. We have been using silver alginate. Ultimately I think we need to consider a total contact cast on the right while we work through the possibility of osteomyelitis in the left. I watched her walk out with her crutches I have some trepidation about the ability to walk with the cast on her right foot. Nevertheless with her Charcot deformities I do not think there is a way to heal these short of offloading them aggressively. 2/22; the culture of the bone in the left foot that I did last week showed a few Citrobacter Koseri as well as some streptococcal species. In my attempted bone for pathology did not actually yield a lot of bone the pathologist did not identify any osteomyelitis. 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 Is promised today and aggressive debridement of the right foot and a total contact  cast which surprisingly she seemed to tolerate quite well 03/13/2019 upon evaluation today patient appears to be doing well with her total contact cast she is here for the first cast change. She had no areas of rubbing or any other complications at this point. Overall things are doing quite well. 2/26; the patient was kindly seen by Dr. Orvan Falconer of infectious disease  on 2/25. She is now on IV vancomycin PICC line placement in 2 days and oral Flagyl. This was in response to her MRI showing osteomyelitis of the left calcaneus concerning for osteomyelitis. We have been putting her right leg in a total contact cast. Our nurses report drainage. She is tolerating the total contact cast well. 3/4; the patient is started her IV antibiotics and is taking IV vancomycin and oral Flagyl. She appears to be tolerating these both well. This is for osteomyelitis involving the left calcaneus. The area on the right in the setting of bilateral Charcot deformities did not have any bone infection on MRI. We put a total contact cast on her with silver alginate as the primary dressing and she returns today in follow-up. Per our intake nurse there was too much drainage to leave this on all week therefore we will bring her back for an early change 3/8; follow-up total contact cast she is still having a lot of drainage probably too much drainage from the right foot wound will week with the same cast. She will be back on Thursday. The area on the left looks somewhat better 3/11; patient here for a total contact cast change. 3/15; patient came in for wound care evaluation. She is still on IV vancomycin and oral Flagyl for osteomyelitis in the left foot. The area on the right foot we are putting in a total contact cast. 3/18 for total contact cast change on the right foot 3/22; the patient was here for wound review today. The area on the right foot is slightly smaller in terms of surface area. However the surface of the wound is very gritty and hyper granulated. More problematically the area on the plantar left foot has increased depth indeed in the center of this it probes to bone. We have been using Hydrofera Blue in both areas. She is on antibiotics as prescribed by infectious disease IV vancomycin and oral Flagyl 3/25; the patient is in for total contact cast change. Our intake nurse  was concerned about the amount of drainage which soaked right to the multiple layers we are putting on this. Wondered about options. The wound bed actually looks as healthy as this is looked although there may not be a lot of change in dimensions things are looking a lot better. If we are going to heal this woman's wound which is in the middle of her right Charcot foot this is going to need to be offloaded. There are not many alternatives 3/29; still not a lot of improvement although the surface of the right wound looks better. We have been using Hydrofera Blue Objective Constitutional Patient is hypertensive.. Pulse regular and within target range for patient.Marland Kitchen Respirations regular, non-labored and within target range.. Temperature is normal and within the target range for the patient.Marland Kitchen Appears in no distress. Vitals Time Taken: 1:25 PM, Height: 68 in, Weight: 265 lbs, BMI: 40.3, Temperature: 98 F, Pulse: 67 bpm, Respiratory Rate: 18 breaths/min, Blood Pressure: 143/60 mmHg. General Notes: Wound exam; the area on the right foot was debrided with a #5 curette of skin subcutaneous tissue around the margin  and then a #10 scalpel of hyper granulated tissue from the wound itself hemostasis with silver nitrate and pressure dressing ooOn the left #5 curette skin and subcutaneous tissue from the wound margins Integumentary (Hair, Skin) Wound #10 status is Open. Original cause of wound was Blister. The wound is located on the Left,Lateral,Plantar Foot. The wound measures 1.5cm length x 1.7cm width x 1.8cm depth; 2.003cm^2 area and 3.605cm^3 volume. There is bone, muscle, and Fat Layer (Subcutaneous Tissue) Exposed exposed. There is no tunneling or undermining noted. There is a medium amount of serosanguineous drainage noted. The wound margin is thickened. There is large (67-100%) red, pink granulation within the wound bed. There is no necrotic tissue within the wound bed. General Notes: callus and  maceration noted to periwound. Wound #9 status is Open. Original cause of wound was Blister. The wound is located on the Right,Medial Foot. The wound measures 3.5cm length x 3cm width x 0.3cm depth; 8.247cm^2 area and 2.474cm^3 volume. There is Fat Layer (Subcutaneous Tissue) Exposed exposed. There is no tunneling or undermining noted. There is a large amount of purulent drainage noted. The wound margin is thickened. There is large (67-100%) red, pink, hyper - granulation within the wound bed. There is no necrotic tissue within the wound bed. 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 There was a Excisional Skin/Subcutaneous Tissue Debridement with a total area of 2.55 sq cm performed by [name], MD. With the following instrument(s): Blade, and Curette to remove Viable and Non-Viable tissue/material. Material removed includes Callus, Subcutaneous Tissue, Slough, and Skin: Epidermis after achieving pain control using Other (benzocaine 20% spray). No specimens were taken. A time out was conducted at 14:00, prior to the start of the procedure. A Minimum amount of bleeding was controlled with Pressure. The procedure was tolerated well with a pain level of 0 throughout and a pain level of 0  following the procedure. Post Debridement Measurements: 1.5cm length x 1.7cm width x 1.8cm depth; 3.605cm^3 volume. Character of Wound/Ulcer Post Debridement is improved. Severity of Tissue Post Debridement is: Fat layer exposed. Post procedure Diagnosis Wound #10: Same as Pre-Procedure Wound #9 Pre-procedure diagnosis of Wound #9 is a Diabetic Wound/Ulcer of the Lower Extremity located on the Right,Medial Foot .Severity of Tissue Pre Debridement is: Fat layer exposed. There was a Excisional Skin/Subcutaneous Tissue Debridement with a total area of 10.5 sq cm performed by [name], MD. With the following instrument(s): Blade, and Curette to remove Viable and Non-Viable tissue/material. Material removed includes Callus, Subcutaneous Tissue, and Skin: Epidermis after achieving pain control using Other (benzocaine 20% spray). No specimens were taken. A time out was conducted at 14:00, prior to the start of the procedure. A Minimum amount of bleeding was controlled with Pressure. The procedure was tolerated well with a pain level of 0 throughout and a pain level of 0 following the procedure.  Post Debridement Measurements: 3.5cm length x 3cm width x 0.3cm depth; 2.474cm^3 volume. Character of Wound/Ulcer Post Debridement is improved. Severity of Tissue Post Debridement is: Fat layer exposed. Post procedure Diagnosis Wound #9: Same as Pre-Procedure Pre-procedure diagnosis of Wound #9 is a Diabetic Wound/Ulcer of the Lower Extremity located on the Right,Medial Foot . There was a Total Contact Cast Procedure by Ricard Dillon., MD. Post procedure Diagnosis Wound #9: Same as Pre-Procedure Plan Follow-up Appointments: Return Appointment in 1 week. - Monday for MD visit Return Appointment in: - Wednesday or Friday. for cast change Dressing Change Frequency: Wound #10 Left,Lateral,Plantar Foot: Change Dressing every other day. Wound #9 Right,Medial Foot: Do not change entire dressing  for one week. Wound Cleansing: May shower with protection. Primary Wound Dressing: Wound #10 Left,Lateral,Plantar Foot: Calcium Alginate with Silver - lightly pack Wound #9 Right,Medial Foot: Hydrofera Blue Secondary Dressing: Wound #10 Left,Lateral,Plantar Foot: Kerlix/Rolled Gauze Dry Gauze Other: - felt callous pad Wound #9 Right,Medial Foot: Kerlix/Rolled Gauze Zetuvit or Kerramax Drawtex Carboflex Off-Loading: Total Contact Cast to Right Lower Extremity Open toe surgical shoe to: - add felt to sandal also 1. Continue with Hydrofera Blue to both wound areas 2. Continue with a total contact cast on the right Electronic Signature(s) Signed: 04/15/2019 5:31:36 PM By: Linton Ham MD Entered By: Linton Ham on 04/15/2019 15:06:51 -------------------------------------------------------------------------------- Total Contact Cast Details Patient Name: Date of Service: [name], [name] 04/15/2019 1:15 PM Medical Record [id number] Patient Account Number: [id number] Date of Birth/Sex: Treating RN: [birth year] (64 y.o. [name] Primary Care Provider: Dewayne Hatch Other Clinician: Referring Provider: Dewayne Hatch Treating Provider/Extender:Terriyah Westra, Esperanza Richters in Treatment: 11 Total Contact Cast Applied for Wound Assessment: Wound #9 Right,Medial Foot Performed By: Physician Ricard Dillon., MD Post Procedure Diagnosis Same as Pre-procedure Electronic Signature(s) Signed: 04/15/2019 5:31:36 PM By: Linton Ham MD Entered By: Linton Ham on 04/15/2019 15:04:54 -------------------------------------------------------------------------------- SuperBill Details Patient Name: Date of Service: [name] 04/15/2019 Medical Record [id number] Patient Account Number: [id number] Date of Birth/Sex: Treating RN: 1955-06-07 (63 y.o. Elam Dutch Primary Care Provider: Dewayne Hatch Other Clinician: Referring  Provider: Treating Provider/Extender:Obera Stauch, [name], [facility] Weeks in Treatment: 11 Diagnosis Coding ICD-10 Codes Code Description E11.621 Type 2 diabetes mellitus with foot ulcer L97.524 Non-pressure chronic ulcer of other part of left foot with necrosis of bone L97.511 Non-pressure chronic ulcer of other part of right foot limited to breakdown of skin L97.514 Non-pressure chronic ulcer of other part of right foot with necrosis of bone E11.42 Type 2 diabetes mellitus with diabetic polyneuropathy M14.671 Charcot's joint, right ankle and foot M14.672 Charcot's joint, left ankle and foot 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, left ankle and foot Facility Procedures CPT4 Code Description: [id number] - DEB SUBQ TISSUE 20 SQ CM/< ICD-10 Diagnosis Description L97.524 Non-pressure chronic ulcer of other part of left foot with L97.514 Non-pressure chronic ulcer of other part of right foot wit Modifier: necrosis of b h necrosis of Quantity: 1 one bone Physician Procedures CPT4 Code Description: [id number] - WC PHYS SUBQ TISS 20 SQ CM ICD-10 Diagnosis Description L97.524 Non-pressure chronic ulcer of other part of left foot wi L97.514 Non-pressure chronic ulcer of other part of right foot w Modifier: th necrosis of ith necrosis of Quantity: 1 bone bone Electronic Signature(s) Signed: 04/15/2019 5:31:36 PM By: Linton Ham MD Entered By: Linton Ham on 04/15/2019 15:07:04

## 2019-04-22 ENCOUNTER — Encounter (HOSPITAL_BASED_OUTPATIENT_CLINIC_OR_DEPARTMENT_OTHER): Payer: Medicare Other | Attending: Internal Medicine | Admitting: Internal Medicine

## 2019-04-22 ENCOUNTER — Other Ambulatory Visit: Payer: Self-pay

## 2019-04-22 ENCOUNTER — Encounter: Payer: Self-pay | Admitting: Internal Medicine

## 2019-04-22 DIAGNOSIS — M86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, left ankle and foot: Secondary | ICD-10-CM | POA: Diagnosis not present

## 2019-04-22 DIAGNOSIS — L97524 Non-pressure chronic ulcer of other part of left foot with necrosis of bone: Secondary | ICD-10-CM | POA: Insufficient documentation

## 2019-04-22 DIAGNOSIS — E11621 Type 2 diabetes mellitus with foot ulcer: Secondary | ICD-10-CM | POA: Diagnosis not present

## 2019-04-22 DIAGNOSIS — E1142 Type 2 diabetes mellitus with diabetic polyneuropathy: Secondary | ICD-10-CM | POA: Insufficient documentation

## 2019-04-22 DIAGNOSIS — L97511 Non-pressure chronic ulcer of other part of right foot limited to breakdown of skin: Secondary | ICD-10-CM | POA: Insufficient documentation

## 2019-04-22 DIAGNOSIS — E11610 Type 2 diabetes mellitus with diabetic neuropathic arthropathy: Secondary | ICD-10-CM | POA: Diagnosis not present

## 2019-04-22 DIAGNOSIS — E1169 Type 2 diabetes mellitus with other specified complication: Secondary | ICD-10-CM | POA: Diagnosis not present

## 2019-04-22 DIAGNOSIS — L97514 Non-pressure chronic ulcer of other part of right foot with necrosis of bone: Secondary | ICD-10-CM | POA: Insufficient documentation

## 2019-04-22 NOTE — Progress Notes (Signed)
[name], [name] ([id number]) Visit Report for 04/22/2019 HPI Details Patient Name: Date of Service: [name], [name] 04/22/2019 3:00 PM Medical Record [id number] Patient Account Number: [id number] Date of Birth/Sex: Treating RN: Oct 02, 1955 (64 y.o. [name] Primary Care Provider: Chevis [name] Other Clinician: Referring Provider: Treating Provider/Extender:[name], [name], [facility] Weeks in Treatment: 12 History of Present Illness HPI Description: 64 yrs old bf here for follow up recurrent left charcot foot ulcer. She has DM. She is not a smoker. She states a blister developed 3 months ago at site of previous breakdown from 1 year ago. It was debrided at the podiatrist's office. She went to the ER and then sent here. She denies pain. Xray was negative for osteo. Culture from this clinic negative. 09/08/[address] Kelly [name] to Dr. Victorino [name] for Charcot foot. Seen by him and MRI scheduled 09/19/14. Prior silver collagen but this was not ordered last visit, patient has been rinsing with saline alone. Re institute silver collagen every other day. F/u 2 weeks with Dr. Leanord [name] as Labor Day holiday. Supplies ordered. 09/25/14; this is a patient with a Charcot foot on the left. she has a wound over the plantar aspect of her left calcaneus. She has been seen by Dr. Victorino [name] of orthopedic surgery and has had an MRI. She is apparently going within the next week or 2 for corrective surgery which will involve an external fixator. I don't have any of the details of this. 10/06/14; The patient is a 64 yrs old bf with a left Charcot foot and ulcer on the plantar. 12/01/14 Returns post surgery with Dr. Victorino [name]. Has follow up visit later this week with him, currently in facility and NWB over this extremity. States no drainage from foot and no current wound care. On exam callus and scab in place, suspect healed. Has external fixator in place. Will defer to Dr. Victorino [name]  for management, ok to place moisturizing cream over scabs and callus and she will call if she requires follow up here. READMISSION 01/28/2019 Patient was in the clinic in 2016 for a wound on the plantar aspect of her left calcaneus. Predominantly cared for by Dr. Ulice [name] she was referred to Dr. Victorino [name] and had corrective surgery for the position of her left ankle I believe. She had previously been here in 2015 and then prior to that in 2011 2012 that I do not have these records. More recently she has developed fairly extensive wounds on the right medial foot, left lateral foot and a small area on the right medial great toe. Right medial foot wound has been there for 6 to 7 months, left foot wound for 2 to 3 months and the right great toe only over the last few weeks. She has been followed by [name] at Dr. Laverta [name] office it sounds as though she has been undergoing callus paring and silver nitrate. The patient has been washing these off with soap and water and plan applying dry dressing Past medical history, type 2 diabetes well controlled with a recent hemoglobin A1c of 7 on 11/16, type 2 diabetes with peripheral neuropathy, previous left Charcot joint surgery bilateral Charcot joints currently, stage III chronic renal failure, hypertension, obstructive sleep apnea, history of MRSA and gastroesophageal reflux. 1/18; this is a patient with bilateral Charcot feet. Plain x-rays that I did of both of these wounds that are either at bone or precariously close to bone were done. On the right foot this showed possible lytic destruction involving the anterior aspect  of the talus which may represent a wound osteomyelitis. An MRI was recommended. On the left there was no definite lytic destruction although the wound is deep undermining's and I think probably requires an MRI on this basis in any case 1/25; patient was supposed to have her MRI last Friday of her bilateral feet however they would not  do it because there was silver alginate in her dressings, will replace with calcium alginate today and will see if we can get her done today 2/1; patient did not get her MRIs done last week because of issues with the MRI department receiving orders to do bilateral feet. She has 2 deep wounds in the setting of Charcot feet deformities bilaterally. Both of them at one point had exposed bone although I had trouble demonstrating that today. I am not sure that we can offload these very aggressively as I watched her leave the clinic last week her gait is very narrow and unsteady. 2/15; the MRI of her right foot did not show osteomyelitis potentially osteonecrosis of the talus. 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 The wounds are a large wound on the right medial foot and on the left a substantial wound on the left lateral plantar calcaneus. We have been using silver alginate. Ultimately I think we need to consider a total contact cast on the right while we work through the possibility of osteomyelitis in the left. I watched her walk out with her crutches I have some trepidation about the ability to walk with the cast on her right foot. Nevertheless with her Charcot deformities I do not think there is a way to heal these short of offloading them aggressively. 2/22; the culture of the bone in the left foot that I did last week showed a few Citrobacter Koseri as well as some streptococcal species. In my attempted bone for pathology did not actually yield a lot of bone the pathologist did not identify any osteomyelitis. 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 Is promised today and aggressive debridement of the right foot and a total contact cast which surprisingly she seemed to tolerate quite well 03/13/2019  upon evaluation today patient appears to be doing well with her total contact cast she is here for the first cast change. She had no areas of rubbing or any other complications at this point. Overall things are doing quite well. 2/26; the patient was kindly seen by Dr. Orvan Falconer of infectious disease on 2/25. She is now on IV vancomycin PICC line placement in 2 days and oral Flagyl. This was in response to her MRI showing osteomyelitis of the left calcaneus concerning for osteomyelitis. We have been putting her right leg in a total contact cast. Our nurses report drainage. She is tolerating the total contact cast well. 3/4; the patient is started her IV antibiotics and is taking IV vancomycin and oral Flagyl. She appears to be tolerating these both well. This is for osteomyelitis involving the left calcaneus. The area on the right in the setting of bilateral Charcot deformities did not have any bone infection on MRI. We put a total contact cast on her with silver alginate as the primary dressing and she returns today in follow-up. Per our intake nurse there was too much drainage to leave this on all week therefore  we will bring her back for an early change 3/8; follow-up total contact cast she is still having a lot of drainage probably too much drainage from the right foot wound will week with the same cast. She will be back on Thursday. The area on the left looks somewhat better 3/11; patient here for a total contact cast change. 3/15; patient came in for wound care evaluation. She is still on IV vancomycin and oral Flagyl for osteomyelitis in the left foot. The area on the right foot we are putting in a total contact cast. 3/18 for total contact cast change on the right foot 3/22; the patient was here for wound review today. The area on the right foot is slightly smaller in terms of surface area. However the surface of the wound is very gritty and hyper granulated. More problematically the area  on the plantar left foot has increased depth indeed in the center of this it probes to bone. We have been using Hydrofera Blue in both areas. She is on antibiotics as prescribed by infectious disease IV vancomycin and oral Flagyl 3/25; the patient is in for total contact cast change. Our intake nurse was concerned about the amount of drainage which soaked right to the multiple layers we are putting on this. Wondered about options. The wound bed actually looks as healthy as this is looked although there may not be a lot of change in dimensions things are looking a lot better. If we are going to heal this woman's wound which is in the middle of her right Charcot foot this is going to need to be offloaded. There are not many alternatives 3/29; still not a lot of improvement although the surface of the right wound looks better. We have been using Hydrofera Blue 04/17/2019 upon evaluation today patient appears to be doing well with a total contact cast. With actually having to change this twice a week. She saw Dr. Dellia Nims for wound care earlier in the week this is for the cast change today. That is due to the fact that she has significant drainage. Overall the wound is been looking excellent however. 4/5; we continue to put a total contact cast on this patient with Hydrofera Blue. Still a lot of drainage which precludes a simple weekly change or changing this twice a week. Electronic Signature(s) Signed: 04/22/2019 5:24:04 PM By: Linton Ham MD Entered By: Linton Ham on 04/22/2019 17:03:15 -------------------------------------------------------------------------------- Physical Exam Details Patient Name: Date of Service: [name] 04/22/2019 3:00 PM Medical Record [id number] Patient Account Number: [id number] Date of Birth/Sex: Treating RN: 06-10-1955 (63 y.o. [name] Primary Care Provider: Dewayne Hatch Other Clinician: Referring Provider: Treating  Provider/Extender:Vaneza Pickart, [name], [facility] Weeks in Treatment: 12 Constitutional Sitting or standing Blood Pressure is within target range for patient.. Pulse regular and within target range for patient.Marland Kitchen Respirations regular, non-labored and within target range.. Temperature is normal and within the target range for the patient.Marland Kitchen Appears in no distress. Notes Wound exam; drainage today was quite excessive per our intake nurse. Also noteworthy that she had a linear divot in the middle of this wound which I do not require last time. She does have rims of epithelialization. No mechanical debridement today. Electronic Signature(s) Signed: 04/22/2019 5:24:04 PM By: Linton Ham MD Entered By: Linton Ham on 04/22/2019 17:05:03 -------------------------------------------------------------------------------- Physician Orders Details Patient Name: Date of Service: [name]. 04/22/2019 3:00 PM Medical Record [id number] Patient Account Number: [id number] Date of Birth/Sex: Treating RN: [birth year] (63 y.o. F)  [name] Primary Care Provider: Chevis [name] Other Clinician: Referring Provider: Treating Provider/Extender:Bodey Frizell, [name], [facility] Weeks in Treatment: 12 Verbal / Phone Orders: No Diagnosis Coding ICD-10 Coding Code Description E11.621 Type 2 diabetes mellitus with foot ulcer L97.524 Non-pressure chronic ulcer of other part of left foot with necrosis of bone L97.511 Non-pressure chronic ulcer of other part of right foot limited to breakdown of skin L97.514 Non-pressure chronic ulcer of other part of right foot with necrosis of bone E11.42 Type 2 diabetes mellitus with diabetic polyneuropathy M14.671 Charcot's joint, right ankle and foot M14.672 Charcot's joint, left ankle and foot 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 Return Appointment in: -  Thursday or Friday for cast change Dressing Change Frequency Wound #10 Left,Lateral,Plantar Foot Change Dressing every other day. Wound #9 Right,Medial Foot Do not change entire dressing for one week. Wound Cleansing May shower with protection. Primary Wound Dressing Wound #10 Left,Lateral,Plantar Foot Calcium Alginate with Silver - lightly pack Wound #9 Right,Medial Foot Collagen - into small divot (depth) under Hydrofera Hydrofera Blue - classic Secondary Dressing Wound #10 Left,Lateral,Plantar Foot Kerlix/Rolled Gauze Dry Gauze Other: - felt callous pad Wound #9 Right,Medial Foot Kerlix/Rolled Gauze Zetuvit or Kerramax Drawtex Carboflex Off-Loading Total Contact Cast to Right Lower Extremity Open toe surgical shoe to: - add felt to sandal also Electronic Signature(s) Signed: 04/22/2019 5:09:30 PM By: Sarah Abts RN, BSN Signed: 04/22/2019 5:24:04 PM By: Baltazar Najjar MD Entered By: [name] on 04/22/2019 15:53:06 -------------------------------------------------------------------------------- Problem List Details Patient Name: Date of Service: Sarah Jaeger. 04/22/2019 3:00 PM Medical Record [id number] Patient Account Number: [id number] Date of Birth/Sex: Treating RN: 11-24-1955 (64 y.o. [name] Primary Care Provider: Chevis [name] Other Clinician: Referring Provider: Treating Provider/Extender:Trebor Galdamez, [name], [facility] Weeks in Treatment: 12 Active Problems ICD-10 Evaluated Encounter Code Description Active Date Today Diagnosis E11.621 Type 2 diabetes mellitus with foot ulcer 01/28/2019 No Yes L97.524 Non-pressure chronic ulcer of other part of left foot 01/28/2019 No Yes with necrosis of bone L97.511 Non-pressure chronic ulcer of other part of right foot 01/28/2019 No Yes limited to breakdown of skin L97.514 Non-pressure chronic ulcer of other part of right foot 01/28/2019 No Yes with necrosis of bone E11.42 Type 2  diabetes mellitus with diabetic polyneuropathy 01/28/2019 No Yes M14.671 Charcot's joint, right ankle and foot 01/28/2019 No Yes M14.672 Charcot's joint, left ankle and foot 01/28/2019 No Yes 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 04/22/2019 3:00 PM Medical Record [id number] Patient Account Number: [id number] Date of Birth/Sex: Treating RN: 19-Apr-1955 (64 y.o. [name] Primary Care Provider: Chevis [name] Other Clinician: Referring Provider: Treating Provider/Extender:Kamarrion Stfort, [name], [facility] Weeks in Treatment: 12 Subjective History of Present Illness (HPI) 64 yrs old bf here for follow up recurrent left charcot foot ulcer. She has DM. She is not a smoker. She states a blister developed 3 months ago at site of previous breakdown from 1 year ago. It was debrided at the podiatrist's office. She went to the ER and then sent here. She denies pain. Xray was negative for osteo. Culture from this clinic negative. 09/08/14 Referred by Dr. Kelly [name] to Dr. Victorino [name] for Charcot foot. Seen by him and MRI scheduled 09/19/14. Prior silver collagen but this was not ordered  last visit, patient has been rinsing with saline alone. Re institute silver collagen every other day. F/u 2 weeks with Dr. Leanord [name] as Labor Day holiday. Supplies ordered. 09/25/14; this is a patient with a Charcot foot on the left. she has a wound over the plantar aspect of her left calcaneus. She has been seen by Dr. Victorino [name] of orthopedic surgery and has had an MRI. She is apparently going within the next week or 2 for corrective surgery which will involve an  external fixator. I don't have any of the details of this. 10/06/14; The patient is a 63 yrs old bf with a left Charcot foot and ulcer on the plantar. 12/01/14 Returns post surgery with Dr. Victorino [name]. Has follow up visit later this week with him, currently in facility and NWB over this extremity. States no drainage from foot and no current wound care. On exam callus and scab in place, suspect healed. Has external fixator in place. Will defer to Dr. Victorino [name] for management, ok to place moisturizing cream over scabs and callus and she will call if she requires follow up here. READMISSION 01/28/2019 Patient was in the clinic in 2016 for a wound on the plantar aspect of her left calcaneus. Predominantly cared for by Dr. Ulice [name] she was referred to Dr. Victorino [name] and had corrective surgery for the position of her left ankle I believe. She had previously been here in 2015 and then prior to that in 2011 2012 that I do not have these records. More recently she has developed fairly extensive wounds on the right medial foot, left lateral foot and a small area on the right medial great toe. Right medial foot wound has been there for 6 to 7 months, left foot wound for 2 to 3 months and the right great toe only over the last few weeks. She has been followed by [name] at Dr. Laverta [name] office it sounds as though she has been undergoing callus paring and silver nitrate. The patient has been washing these off with soap and water and plan applying dry dressing Past medical history, type 2 diabetes well controlled with a recent hemoglobin A1c of 7 on 11/16, type 2 diabetes with peripheral neuropathy, previous left Charcot joint surgery bilateral Charcot joints currently, stage III chronic renal failure, hypertension, obstructive sleep apnea, history of MRSA and gastroesophageal reflux. 1/18; this is a patient with bilateral Charcot feet. Plain x-rays that I did of both of these wounds that are either at bone or  precariously close to bone were done. On the right foot this showed possible lytic destruction involving the anterior aspect of the talus which may represent a wound osteomyelitis. An MRI was recommended. On the left there was no definite lytic destruction although the wound is deep undermining's and I think probably requires an MRI on this basis in any case 1/25; patient was supposed to have her MRI last Friday of her bilateral feet however they would not do it because there was silver alginate in her dressings, will replace with calcium alginate today and will see if we can get her done today 2/1; patient did not get her MRIs done last week because of issues with the MRI department receiving orders to do bilateral feet. She has 2 deep wounds in the setting of Charcot feet deformities bilaterally. Both of them at one point had exposed bone although I had trouble demonstrating that today. I am not sure that we can offload these very aggressively as I watched her leave the clinic  last week her gait is very narrow and unsteady. 2/15; the MRI of her right foot did not show osteomyelitis potentially osteonecrosis of the talus. 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 The wounds are a large wound on the right medial foot and on the left a substantial wound on the left lateral plantar calcaneus. We have been using silver alginate. Ultimately I think we need to consider a total contact cast on the right while we work through the possibility of osteomyelitis in the left. I watched her walk out with her crutches I have some trepidation about the ability to walk with the cast on her right foot. Nevertheless with her Charcot deformities I do not think there is a way to heal these short of offloading them aggressively. 2/22; the culture of the bone in the left foot that I did last week showed a few Citrobacter Koseri as well as some streptococcal species. In  my attempted bone for pathology did not actually yield a lot of bone the pathologist did not identify any osteomyelitis. 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 Is promised today and aggressive debridement of the right foot and a total contact cast which surprisingly she seemed to tolerate quite well 03/13/2019 upon evaluation today patient appears to be doing well with her total contact cast she is here for the first cast change. She had no areas of rubbing or any other complications at this point. Overall things are doing quite well. 2/26; the patient was kindly seen by Dr. Orvan Falconer of infectious disease on 2/25. She is now on IV vancomycin PICC line placement in 2 days and oral Flagyl. This was in response to her MRI showing osteomyelitis of the left calcaneus concerning for osteomyelitis. We have been putting her right leg in a total contact cast. Our nurses report drainage. She is tolerating the total contact cast well. 3/4; the patient is started her IV antibiotics and is taking IV vancomycin and oral Flagyl. She appears to be tolerating these both well. This is for osteomyelitis involving the left calcaneus. The area on the right in the setting of bilateral Charcot deformities did not have any bone infection on MRI. We put a total contact cast on her with silver alginate as the primary dressing and she returns today in follow-up. Per our intake nurse there was too much drainage to leave this on all week therefore we will bring her back for an early change 3/8; follow-up total contact cast she is still having a lot of drainage probably too much drainage from the right foot wound will week with the same cast. She will be back on Thursday. The area on the left looks somewhat better 3/11; patient here for a total contact cast change. 3/15;  patient came in for wound care evaluation. She is still on IV vancomycin and oral Flagyl for osteomyelitis in the left foot. The area on the right foot we are putting in a total contact cast. 3/18 for total contact cast change on the right foot 3/22; the patient was here for wound review today. The area on the right foot is slightly smaller in terms of surface area. However the surface of the wound is very gritty and hyper granulated. More problematically the area on the plantar left foot has increased depth indeed  in the center of this it probes to bone. We have been using Hydrofera Blue in both areas. She is on antibiotics as prescribed by infectious disease IV vancomycin and oral Flagyl 3/25; the patient is in for total contact cast change. Our intake nurse was concerned about the amount of drainage which soaked right to the multiple layers we are putting on this. Wondered about options. The wound bed actually looks as healthy as this is looked although there may not be a lot of change in dimensions things are looking a lot better. If we are going to heal this woman's wound which is in the middle of her right Charcot foot this is going to need to be offloaded. There are not many alternatives 3/29; still not a lot of improvement although the surface of the right wound looks better. We have been using Hydrofera Blue 04/17/2019 upon evaluation today patient appears to be doing well with a total contact cast. With actually having to change this twice a week. She saw Dr. Leanord [name] for wound care earlier in the week this is for the cast change today. That is due to the fact that she has significant drainage. Overall the wound is been looking excellent however. 4/5; we continue to put a total contact cast on this patient with Hydrofera Blue. Still a lot of drainage which precludes a simple weekly change or changing this twice a week. Objective Constitutional Sitting or standing Blood Pressure is within  target range for patient.. Pulse regular and within target range for patient.Marland Kitchen Respirations regular, non-labored and within target range.. Temperature is normal and within the target range for the patient.Marland Kitchen Appears in no distress. Vitals Time Taken: 3:10 PM, Height: 68 in, Weight: 265 lbs, BMI: 40.3, Temperature: 98.2 F, Pulse: 71 bpm, Respiratory Rate: 18 breaths/min, Blood Pressure: 132/67 mmHg. General Notes: Wound exam; drainage today was quite excessive per our intake nurse. Also noteworthy that she had a linear divot in the middle of this wound which I do not require last time. She does have rims of epithelialization. No mechanical debridement today. Integumentary (Hair, Skin) Wound #10 status is Open. Original cause of wound was Blister. The wound is located on the Left,Lateral,Plantar Foot. The wound measures 1.6cm length x 1.6cm width x 2.3cm depth; 2.011cm^2 area and 4.624cm^3 volume. There is bone, muscle, and Fat Layer (Subcutaneous Tissue) Exposed exposed. There is no tunneling or undermining noted. There is a medium amount of serosanguineous drainage noted. The wound margin is thickened. There is large (67-100%) red, pink granulation within the wound bed. There is no necrotic tissue within the wound bed. Wound #9 status is Open. Original cause of wound was Blister. The wound is located on the Right,Medial Foot. The wound measures 3.1cm length x 1.7cm width x 0.2cm depth; 4.139cm^2 area and 0.828cm^3 volume. There is Fat Layer (Subcutaneous Tissue) Exposed exposed. There is no tunneling or undermining noted. There is a large amount of serosanguineous drainage noted. The wound margin is thickened. There is large (67-100%) red, pink, hyper - granulation within the wound bed. There is no necrotic tissue within the wound bed. 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 There was a Total Contact Cast Procedure by [name], MD. Post procedure Diagnosis Wound #9: Same as Pre-Procedure Plan Follow-up Appointments: Return Appointment in 1 week. Return Appointment in: - Thursday or Friday for cast change Dressing Change Frequency: Wound #10 Left,Lateral,Plantar Foot: Change Dressing every other day. Wound #9 Right,Medial Foot: Do not change entire dressing for one week. Wound Cleansing: May shower with protection. Primary Wound Dressing: Wound #10 Left,Lateral,Plantar Foot: Calcium Alginate with Silver - lightly pack Wound #9 Right,Medial Foot: Collagen - into small divot (depth) under Hydrofera Hydrofera Blue - classic Secondary Dressing: Wound #10 Left,Lateral,Plantar Foot: Kerlix/Rolled Gauze Dry Gauze Other: - felt callous pad Wound #9 Right,Medial Foot: Kerlix/Rolled Gauze Zetuvit or Kerramax Drawtex Carboflex Off-Loading: Total Contact Cast to Right Lower Extremity Open toe surgical shoe to: - add felt to sandal also 1. Left lateral plantar foot lightly packed with silver alginate 2. Slight amount of plain collagen in the divot under Hydrofera Blue 3. Continue with a total contact cast of the Charcot foot on the right. There are not many alternatives here. I am hopeful to see some additional epithelialization next time Electronic Signature(s) Signed: 04/22/2019 5:24:04 PM By: Baltazar Najjar MD Entered By: Baltazar Najjar on  04/22/2019 17:06:16 -------------------------------------------------------------------------------- Total Contact Cast Details Patient Name: Date of Service: [name], [name] 04/22/2019 3:00 PM Medical Record [id number] Patient Account Number: [id number] Date of Birth/Sex: Treating RN: 03-20-1955 (64 y.o. [name] Primary Care Provider: Chevis [name] Other Clinician: Referring Provider: Chevis [name] Treating Provider/Extender:Harland Aguiniga, Lamar Sprinkles in Treatment: 12 Total Contact Cast Applied for Wound Assessment: Wound #9 Right,Medial Foot Performed By: Physician [name], MD Post Procedure Diagnosis Same as Pre-procedure Electronic Signature(s) Signed: 04/22/2019 5:24:04 PM By: Baltazar Najjar MD Entered By: Baltazar Najjar on 04/22/2019 17:02:35 -------------------------------------------------------------------------------- SuperBill Details Patient Name: Date of Service: Sarah Jaeger 04/22/2019 Medical Record [id number] Patient Account Number: [id number] Date of Birth/Sex: Treating RN: 10-Jul-1955 (63 y.o. [name] Primary Care Provider: Chevis [name] Other Clinician: Referring Provider: Treating Provider/Extender:Torah Pinnock, [name], [facility] Weeks in Treatment: 12 Diagnosis Coding ICD-10 Codes Code Description E11.621 Type 2 diabetes mellitus with foot ulcer L97.524 Non-pressure chronic ulcer of other part of left foot with necrosis of bone L97.511 Non-pressure chronic ulcer of other part of right foot limited to breakdown of skin L97.514 Non-pressure chronic ulcer of other part of right foot with necrosis of bone E11.42 Type 2 diabetes mellitus with diabetic polyneuropathy M14.671 Charcot's joint, right ankle and foot M14.672 Charcot's joint, left ankle and foot 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, left ankle and foot Facility Procedures CPT4 Code Description: 57846962 [id number] - APPLY TOTAL  CONTACT LEG CAST ICD-10 Diagnosis Description L97.511 Non-pressure chronic ulcer of other part of right foot limit Modifier: ed to breakdo Quantity: 1 wn of skin Physician Procedures CPT4 Code Description: 9528413 [id number] - WC PHYS APPLY TOTAL CONTACT CAST ICD-10 Diagnosis Description L97.511 Non-pressure chronic ulcer of other part of right foot limit Modifier: ed to breakd Quantity: 1 own of skin Electronic Signature(s) Signed: 04/22/2019 5:24:04 PM By: Baltazar Najjar MD Entered By: Baltazar Najjar on 04/22/2019 17:06:59

## 2019-04-24 NOTE — Progress Notes (Signed)
[name] ([id number]) Visit Report for 04/22/2019 Arrival Information Details Patient Name: Date of Service: [name] 04/22/2019 3:00 PM Medical Record [id number] Patient Account Number: [id number] Date of Birth/Sex: Treating RN: February 04, 1955 (65 y.o. [name] Primary Care Azure Barrales: Chevis Pretty Other Clinician: Referring Kert Shackett: Treating Madden Garron/Extender:Robson, [name], [facility] Weeks in Treatment: 12 Visit Information History Since Last Visit All ordered tests and consults were No Patient Arrived: Crutches completed: Arrival Time: 15:09 Added or deleted any medications: No Accompanied By: self Any new allergies or adverse reactions: No Transfer Assistance: None Had a fall or experienced change in No Patient Identification Verified: Yes activities of daily living that may affect Secondary Verification Process Completed: Yes risk of falls: Patient Requires Transmission-Based No Signs or symptoms of abuse/neglect since No Precautions: last visito Patient Has Alerts: No Hospitalized since last visit: No Implantable device outside of the clinic No excluding cellular tissue based products placed in the center since last visit: Has Dressing in Place as Prescribed: Yes Has Compression in Place as Prescribed: Yes Has Footwear/Offloading in Place as Yes Prescribed: Right: Total Contact Cast Pain Present Now: No Electronic Signature(s) Signed: 04/23/2019 6:43:44 PM By: Yevonne Pax RN Entered By: Yevonne Pax on 04/22/2019 15:09:52 -------------------------------------------------------------------------------- Encounter Discharge Information Details Patient Name: Date of Service: [name]. 04/22/2019 3:00 PM Medical Record [id number] Patient Account Number: [id number] Date of Birth/Sex: Treating RN: Jun 17, 1955 (64 y.o. [name] Primary Care Coleta Grosshans: Chevis Pretty Other Clinician: Referring  Daviana Haymaker: Treating Jory Tanguma/Extender:Robson, [name], [facility] Weeks in Treatment: 12 Encounter Discharge Information Items Discharge Condition: Stable Ambulatory Status: Crutches Discharge Destination: Home Transportation: Private Auto Accompanied By: self Schedule Follow-up Appointment: Yes Clinical Summary of Care: Electronic Signature(s) Signed: 04/22/2019 5:03:02 PM By: Shawn Stall Entered By: Shawn Stall on 04/22/2019 16:20:52 -------------------------------------------------------------------------------- Lower Extremity Assessment Details Patient Name: Date of Service: Sarah, [name] 04/22/2019 3:00 PM Medical Record [id number] Patient Account Number: [id number] Date of Birth/Sex: Treating RN: [birth year] (63 y.o. [name] Primary Care Willow Shidler: Chevis Pretty Other Clinician: Referring Aamya Orellana: Treating Pallas Wahlert/Extender:Robson, [name], [facility] Weeks in Treatment: 12 Edema Assessment Assessed: [Left: No] [Right: No] Edema: [Left: Yes] [Right: Yes] Calf Left: Right: Point of Measurement: 43 cm From Medial Instep 37 cm 41 cm Ankle Left: Right: Point of Measurement: 12 cm From Medial Instep 25.5 cm 27 cm Electronic Signature(s) Signed: 04/23/2019 6:43:44 PM By: Yevonne Pax RN Entered By: Yevonne Pax on 04/22/2019 15:23:23 -------------------------------------------------------------------------------- Multi Wound Chart Details Patient Name: Date of Service: [name]. 04/22/2019 3:00 PM Medical Record [id number] Patient Account Number: [id number] Date of Birth/Sex: Treating RN: 1955-11-10 (64 y.o. [name] Primary Care Daphne Karrer: Chevis Pretty Other Clinician: Referring Gregg Holster: Treating Nashira Mcglynn/Extender:Robson, [name], [facility] Weeks in Treatment: 12 Vital Signs Height(in): 68 Pulse(bpm): 71 Weight(lbs): 265 Blood Pressure(mmHg): 132/67 Body Mass Index(BMI):  40 Temperature(F): 98.2 Respiratory 18 Rate(breaths/min): Photos: [10:No Photos] [9:No Photos] [N/A:N/A] Wound Location: [10:Left, Lateral, Plantar Foot Right, Medial Foot] [N/A:N/A] Wounding Event: [10:Blister] [9:Blister] [N/A:N/A] Primary Etiology: [10:Diabetic Wound/Ulcer of the Diabetic Wound/Ulcer of the N/A Lower Extremity] [9:Lower Extremity] Comorbid History: [10:Glaucoma, Chronic sinus Glaucoma, Chronic sinus N/A problems/congestion, Anemia, Sleep Apnea, Hypertension, Type II Diabetes, Rheumatoid Arthritis, Neuropathy] [9:problems/congestion, Anemia, Sleep Apnea, Hypertension, Type II  Diabetes, Rheumatoid Arthritis, Neuropathy] Date Acquired: [10:11/18/2018] [9:06/18/2018] [N/A:N/A] Weeks of Treatment: [10:12] [9:12] [N/A:N/A] Wound Status: [10:Open] [9:Open] [N/A:N/A] Measurements L x W x D 1.6x1.6x2.3 [9:3.1x1.7x0.2] [N/A:N/A] (cm) Area (cm) : [10:2.011] [9:4.139] [N/A:N/A] Volume (cm) : [10:4.624] [9:0.828] [N/A:N/A] % Reduction  in Area: [10:-14.30%] [9:62.40%] [N/A:N/A] % Reduction in Volume: -75.20% [9:91.60%] [N/A:N/A] Classification: [10:Grade 2] [9:Grade 2] [N/A:N/A] Exudate Amount: [10:Medium] [9:Large] [N/A:N/A] Exudate Type: [10:Serosanguineous] [9:Serosanguineous] [N/A:N/A] Exudate Color: [10:red, brown] [9:red, brown] [N/A:N/A] Wound Margin: [10:Thickened] [9:Thickened] [N/A:N/A] Granulation Amount: [10:Large (67-100%)] [9:Large (67-100%)] [N/A:N/A] Granulation Quality: [10:Red, Pink] [9:Red, Pink, Hyper- granulation] [N/A:N/A] Necrotic Amount: [10:None Present (0%)] [9:None Present (0%)] [N/A:N/A] Exposed Structures: [10:Fat Layer (Subcutaneous Fat Layer (Subcutaneous N/A Tissue) Exposed: Yes Muscle: Yes Bone: Yes Fascia: No Tendon: No Joint: No] [9:Tissue) Exposed: Yes Fascia: No Tendon: No Muscle: No Joint: No Bone: No] Epithelialization: [10:None] [9:Small (1-33%)] [N/A:N/A] Assessment Notes: [10:N/A] [9:periwound mascerated Total Contact Cast] [N/A:N/A  N/A] Treatment Notes Wound #10 (Left, Lateral, Plantar Foot) 1. Cleanse With Wound Cleanser 2. Periwound Care Moisturizing lotion 3. Primary Dressing Applied Calcium Alginate Ag 4. Secondary Dressing ABD Pad Dry Gauze Roll Gauze 5. Secured With Medipore tape 7. Footwear/Offloading device applied Felt/Foam Surgical shoe Notes x2 felt. stockinette Wound #9 (Right, Medial Foot) 1. Cleanse With Wound Cleanser Soap and water 2. Periwound Care Moisturizing lotion 3. Primary Dressing Applied Collagen Hydrofera Blue 4. Secondary Dressing ABD Pad Dry Gauze Roll Gauze Kerramax/Xtrasorb Drawtex 5. Secured With Medipore tape 7. Footwear/Offloading device applied Total Contact Cast Notes carboflex. TCC applied by MD. Electronic Signature(s) Signed: 04/22/2019 5:09:30 PM By: Zandra Abts RN, BSN Signed: 04/22/2019 5:24:04 PM By: Baltazar Najjar MD Entered By: Baltazar Najjar on 04/22/2019 17:02:04 -------------------------------------------------------------------------------- Multi-Disciplinary Care Plan Details Patient Name: Date of Service: [facility] 04/22/2019 3:00 PM Medical Record [id number] Patient Account Number: [id number] Date of Birth/Sex: Treating RN: 06/24/1955 (63 y.o. [name] Primary Care Taylon Louison: Chevis Pretty Other Clinician: Referring Magic Mohler: Treating Sasuke Yaffe/Extender:Robson, [name], [facility] Weeks in Treatment: 12 Active Inactive Wound/Skin Impairment Nursing Diagnoses: Impaired tissue integrity Knowledge deficit related to ulceration/compromised skin integrity Goals: Patient/caregiver will verbalize understanding of skin care regimen Date Initiated: 01/28/2019 Target Resolution Date: 05/16/2019 Goal Status: Active Interventions: Assess patient/caregiver ability to obtain necessary supplies Assess patient/caregiver ability to perform ulcer/skin care regimen upon admission and as needed Assess  ulceration(s) every visit Provide education on ulcer and skin care Notes: Electronic Signature(s) Signed: 04/22/2019 5:09:30 PM By: Zandra Abts RN, BSN Entered By: Zandra Abts on 04/22/2019 17:02:55 -------------------------------------------------------------------------------- Pain Assessment Details Patient Name: Date of Service: [name]. 04/22/2019 3:00 PM Medical Record [id number] Patient Account Number: [id number] Date of Birth/Sex: Treating RN: 1956-01-14 (64 y.o. [name] Primary Care Yasir Kitner: Chevis Pretty Other Clinician: Referring Lendy Dittrich: Treating Tenasia Aull/Extender:Robson, [name], [facility] Weeks in Treatment: 12 Active Problems Location of Pain Severity and Description of Pain Patient Has Paino No Site Locations Pain Management and Medication Current Pain Management: Electronic Signature(s) Signed: 04/23/2019 6:43:44 PM By: Yevonne Pax RN Entered By: Yevonne Pax on 04/22/2019 15:12:56 -------------------------------------------------------------------------------- Patient/Caregiver Education Details Patient Name: [name] 4/5/2021andnbsp3:00 Date of Service: PM Medical Record [id number] Number: Patient Account Number: [id number] Date of Birth/Gender: 12-23-1955 (63 y.o. F) Treating RN: Zandra Abts Other Clinician: Primary Care Physician: Chevis Pretty Treating Baltazar Najjar Physician/ExtenderMaryla Morrow, Referring Physician: Glorianne [place] in Treatment: 12 Education Assessment Education Provided To: Patient Education Topics Provided Wound/Skin Impairment: Methods: Explain/Verbal Responses: State content correctly Electronic Signature(s) Signed: 04/22/2019 5:09:30 PM By: Zandra Abts RN, BSN Entered By: Zandra Abts on 04/22/2019 17:03:05 -------------------------------------------------------------------------------- Wound Assessment Details Patient Name: Date of  Service: [name]. 04/22/2019 3:00 PM Medical Record [id number] Patient Account Number: [id number] Date of Birth/Sex: Treating RN: 04-05-1955 (63 y.o. [name] Primary Care Belinda Bringhurst:  Chevis Pretty Other Clinician: Referring Suresh Audi: Treating Quinette Hentges/Extender:Robson, [name], [facility] Weeks in Treatment: 12 Wound Status Wound Number: 10 Primary Diabetic Wound/Ulcer of the Lower Extremity Etiology: Wound Location: Left, Lateral, Plantar Foot Wound Open Wounding Event: Blister Status: Date Acquired: 11/18/2018 Comorbid Glaucoma, Chronic sinus Weeks Of Treatment: 12 History: problems/congestion, Anemia, Sleep Apnea, Clustered Wound: No Hypertension, Type II Diabetes, Rheumatoid Arthritis, Neuropathy Wound Measurements Length: (cm) 1.6 % Reduction in Width: (cm) 1.6 % Reduction in Depth: (cm) 2.3 Epithelializati Area: (cm) 2.011 Tunneling: Volume: (cm) 4.624 Undermining: Wound Description Classification: Grade 2 Foul Odor After Wound Margin: Thickened Slough/Fibrino Exudate Amount: Medium Exudate Type: Serosanguineous Exudate Color: red, brown Wound Bed Granulation Amount: Large (67-100%) Granulation Quality: Red, Pink Fascia Exposed: Necrotic Amount: None Present (0%) Fat Layer (Subc Tendon Exposed: Muscle Exposed: Necrosis o Joint Exposed: Bone Exposed: Cleansing: No No Exposed Structure No utaneous Tissue) Exposed: Yes No Yes f Muscle: No No Yes Area: -14.3% Volume: -75.2% on: None No No Treatment Notes Wound #10 (Left, Lateral, Plantar Foot) 1. Cleanse With Wound Cleanser 2. Periwound Care Moisturizing lotion 3. Primary Dressing Applied Calcium Alginate Ag 4. Secondary Dressing ABD Pad Dry Gauze Roll Gauze 5. Secured With Medipore tape 7. Footwear/Offloading device applied Felt/Foam Surgical shoe Notes x2 felt. stockinette Electronic Signature(s) Signed: 04/23/2019 6:43:44 PM By: Yevonne Pax  RN Entered By: Yevonne Pax on 04/22/2019 15:24:34 -------------------------------------------------------------------------------- Wound Assessment Details Patient Name: Date of Service: Sarah, [name] 04/22/2019 3:00 PM Medical Record [id number] Patient Account Number: [id number] Date of Birth/Sex: Treating RN: 05/03/1955 (64 y.o. [name] Primary Care Kamryn Gauthier: Chevis Pretty Other Clinician: Referring Elanor Cale: Treating Odessa Morren/Extender:Robson, [name], [facility] Weeks in Treatment: 12 Wound Status Wound Number: 9 Primary Diabetic Wound/Ulcer of the Lower Extremity Etiology: Wound Location: Right, Medial Foot Wound Open Wounding Event: Blister Status: Date Acquired: 06/18/2018 Comorbid Glaucoma, Chronic sinus Weeks Of Treatment: 12 History: problems/congestion, Anemia, Sleep Apnea, Clustered Wound: No Hypertension, Type II Diabetes, Rheumatoid Arthritis, Neuropathy Wound Measurements Length: (cm) 3.1 % Reductio Width: (cm) 1.7 % Reductio Depth: (cm) 0.2 Epithelial Area: (cm) 4.139 Tunneling Volume: (cm) 0.828 Undermini Wound Description Classification: Grade 2 Foul Odor Wound Margin: Thickened Slough/Fi Exudate Amount: Large Exudate Type: Serosanguineous Exudate Color: red, brown Wound Bed Granulation Amount: Large (67-100%) Granulation Quality: Red, Pink, Hyper-granulation Fascia Exp Necrotic Amount: None Present (0%) Fat Layer Tendon Exp Muscle Exp Joint Expo Bone Expo After Cleansing: No brino No Exposed Structure osed: No (Subcutaneous Tissue) Exposed: Yes osed: No osed: No sed: No sed: No n in Area: 62.4% n in Volume: 91.6% ization: Small (1-33%) : No ng: No Assessment Notes periwound mascerated Treatment Notes Wound #9 (Right, Medial Foot) 1. Cleanse With Wound Cleanser Soap and water 2. Periwound Care Moisturizing lotion 3. Primary Dressing Applied Collagen Hydrofera Blue 4. Secondary Dressing ABD  Pad Dry Gauze Roll Gauze Kerramax/Xtrasorb Drawtex 5. Secured With Medipore tape 7. Footwear/Offloading device applied Total Contact Cast Notes carboflex. TCC applied by MD. Electronic Signature(s) Signed: 04/23/2019 6:43:44 PM By: Yevonne Pax RN Entered By: Yevonne Pax on 04/22/2019 15:25:02 -------------------------------------------------------------------------------- Vitals Details Patient Name: Date of Service: [name]. 04/22/2019 3:00 PM Medical Record [id number] Patient Account Number: [id number] Date of Birth/Sex: Treating RN: October 09, 1955 (63 y.o. Sharyne Peach, Carrie Primary Care Kirstein Baxley: Chevis Pretty Other Clinician: Referring Enis Leatherwood: Treating Breyona Swander/Extender:Robson, [name], [facility] Weeks in Treatment: 12 Vital Signs Time Taken: 15:10 Temperature (F): 98.2 Height (in): 68 Pulse (bpm): 71 Weight (lbs): 265 Respiratory Rate (breaths/min): 18 Body Mass Index (BMI):  40.3 Blood Pressure (mmHg): 132/67 Reference Range: 80 - 120 mg / dl Electronic Signature(s) Signed: 04/23/2019 6:43:44 PM By: Carlene Coria RN Entered By: Carlene Coria on 04/22/2019 15:12:45

## 2019-04-25 ENCOUNTER — Encounter (HOSPITAL_BASED_OUTPATIENT_CLINIC_OR_DEPARTMENT_OTHER): Payer: Medicare Other | Admitting: Internal Medicine

## 2019-04-25 ENCOUNTER — Other Ambulatory Visit: Payer: Self-pay

## 2019-04-25 DIAGNOSIS — E11621 Type 2 diabetes mellitus with foot ulcer: Secondary | ICD-10-CM | POA: Diagnosis not present

## 2019-04-25 NOTE — Progress Notes (Signed)
[name], [name] ([id number]) Visit Report for 04/25/2019 HPI Details Patient Name: Date of Service: [name], [name] 04/25/2019 9:15 AM Medical Record [id number] Patient Account Number: [id number] Date of Birth/Sex: Treating RN: January 14, 1956 (64 y.o. [name] Primary Care Provider: Chevis Pretty Other Clinician: Referring Provider: Treating Provider/Extender:Aristotelis Vilardi, [name], [facility] Weeks in Treatment: 12 History of Present Illness HPI Description: 64 yrs old bf here for follow up recurrent left charcot foot ulcer. She has DM. She is not a smoker. She states a blister developed 3 months ago at site of previous breakdown from 1 year ago. It was debrided at the podiatrist's office. She went to the ER and then sent here. She denies pain. Xray was negative for osteo. Culture from this clinic negative. 09/08/[name] to [name] for Charcot foot. Seen by him and MRI scheduled 09/19/14. Prior silver collagen but this was not ordered last visit, patient has been rinsing with saline alone. Re institute silver collagen every other day. F/u 2 weeks with [name] as Labor Day holiday. Supplies ordered. 09/25/14; this is a patient with a Charcot foot on the left. she has a wound over the plantar aspect of her left calcaneus. She has been seen by [name] of orthopedic surgery and has had an MRI. She is apparently going within the next week or 2 for corrective surgery which will involve an external fixator. I don't have any of the details of this. 10/06/14; The patient is a 64 yrs old bf with a left Charcot foot and ulcer on the plantar. 12/01/14 Returns post surgery with [name]. Has follow up visit later this week with him, currently in facility and NWB over this extremity. States no drainage from foot and no current wound care. On exam callus and scab in place, suspect healed. Has external fixator in place. Will defer to [name]  for management, ok to place moisturizing cream over scabs and callus and she will call if she requires follow up here. READMISSION 01/28/2019 Patient was in the clinic in 2016 for a wound on the plantar aspect of her left calcaneus. Predominantly cared for by [name] she was referred to [name] and had corrective surgery for the position of her left ankle I believe. She had previously been here in 2015 and then prior to that in 2011 2012 that I do not have these records. More recently she has developed fairly extensive wounds on the right medial foot, left lateral foot and a small area on the right medial great toe. Right medial foot wound has been there for 6 to 7 months, left foot wound for 2 to 3 months and the right great toe only over the last few weeks. She has been followed by [name] at Dr. Laverta [place] office it sounds as though she has been undergoing callus paring and silver nitrate. The patient has been washing these off with soap and water and plan applying dry dressing Past medical history, type 2 diabetes well controlled with a recent hemoglobin A1c of 7 on 11/16, type 2 diabetes with peripheral neuropathy, previous left Charcot joint surgery bilateral Charcot joints currently, stage III chronic renal failure, hypertension, obstructive sleep apnea, history of MRSA and gastroesophageal reflux. 1/18; this is a patient with bilateral Charcot feet. Plain x-rays that I did of both of these wounds that are either at bone or precariously close to bone were done. On the right foot this showed possible lytic destruction involving the anterior aspect  of the talus which may represent a wound osteomyelitis. An MRI was recommended. On the left there was no definite lytic destruction although the wound is deep undermining's and I think probably requires an MRI on this basis in any case 1/25; patient was supposed to have her MRI last Friday of her bilateral feet however they would not  do it because there was silver alginate in her dressings, will replace with calcium alginate today and will see if we can get her done today 2/1; patient did not get her MRIs done last week because of issues with the MRI department receiving orders to do bilateral feet. She has 2 deep wounds in the setting of Charcot feet deformities bilaterally. Both of them at one point had exposed bone although I had trouble demonstrating that today. I am not sure that we can offload these very aggressively as I watched her leave the clinic last week her gait is very narrow and unsteady. 2/15; the MRI of her right foot did not show osteomyelitis potentially osteonecrosis of the talus. 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 The wounds are a large wound on the right medial foot and on the left a substantial wound on the left lateral plantar calcaneus. We have been using silver alginate. Ultimately I think we need to consider a total contact cast on the right while we work through the possibility of osteomyelitis in the left. I watched her walk out with her crutches I have some trepidation about the ability to walk with the cast on her right foot. Nevertheless with her Charcot deformities I do not think there is a way to heal these short of offloading them aggressively. 2/22; the culture of the bone in the left foot that I did last week showed a few Citrobacter Koseri as well as some streptococcal species. In my attempted bone for pathology did not actually yield a lot of bone the pathologist did not identify any osteomyelitis. 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 Is promised today and aggressive debridement of the right foot and a total contact cast which surprisingly she seemed to tolerate quite well 03/13/2019  upon evaluation today patient appears to be doing well with her total contact cast she is here for the first cast change. She had no areas of rubbing or any other complications at this point. Overall things are doing quite well. 2/26; the patient was kindly seen by Dr. Orvan Falconer of infectious disease on 2/25. She is now on IV vancomycin PICC line placement in 2 days and oral Flagyl. This was in response to her MRI showing osteomyelitis of the left calcaneus concerning for osteomyelitis. We have been putting her right leg in a total contact cast. Our nurses report drainage. She is tolerating the total contact cast well. 3/4; the patient is started her IV antibiotics and is taking IV vancomycin and oral Flagyl. She appears to be tolerating these both well. This is for osteomyelitis involving the left calcaneus. The area on the right in the setting of bilateral Charcot deformities did not have any bone infection on MRI. We put a total contact cast on her with silver alginate as the primary dressing and she returns today in follow-up. Per our intake nurse there was too much drainage to leave this on all week therefore  we will bring her back for an early change 3/8; follow-up total contact cast she is still having a lot of drainage probably too much drainage from the right foot wound will week with the same cast. She will be back on Thursday. The area on the left looks somewhat better 3/11; patient here for a total contact cast change. 3/15; patient came in for wound care evaluation. She is still on IV vancomycin and oral Flagyl for osteomyelitis in the left foot. The area on the right foot we are putting in a total contact cast. 3/18 for total contact cast change on the right foot 3/22; the patient was here for wound review today. The area on the right foot is slightly smaller in terms of surface area. However the surface of the wound is very gritty and hyper granulated. More problematically the area  on the plantar left foot has increased depth indeed in the center of this it probes to bone. We have been using Hydrofera Blue in both areas. She is on antibiotics as prescribed by infectious disease IV vancomycin and oral Flagyl 3/25; the patient is in for total contact cast change. Our intake nurse was concerned about the amount of drainage which soaked right to the multiple layers we are putting on this. Wondered about options. The wound bed actually looks as healthy as this is looked although there may not be a lot of change in dimensions things are looking a lot better. If we are going to heal this woman's wound which is in the middle of her right Charcot foot this is going to need to be offloaded. There are not many alternatives 3/29; still not a lot of improvement although the surface of the right wound looks better. We have been using Hydrofera Blue 04/17/2019 upon evaluation today patient appears to be doing well with a total contact cast. With actually having to change this twice a week. She saw [name] for wound care earlier in the week this is for the cast change today. That is due to the fact that she has significant drainage. Overall the wound is been looking excellent however. 4/5; we continue to put a total contact cast on this patient with Hydrofera Blue. Still a lot of drainage which precludes a simple weekly change or changing this twice a week. 4/8; total contact cast changed today. Apparently the wound looks better per our intake nurse Electronic Signature(s) Signed: 04/25/2019 5:58:56 PM By: Baltazar Najjar MD Entered By: Baltazar Najjar on 04/25/2019 11:01:03 -------------------------------------------------------------------------------- Physical Exam Details Patient Name: Date of Service: [name] 04/25/2019 9:15 AM Medical Record [id number] Patient Account Number: [id number] Date of Birth/Sex: Treating RN: 1955-05-08 (64 y.o. [name] Primary  Care Provider: Chevis Pretty Other Clinician: Referring Provider: Treating Provider/Extender:Kleber Crean, [name], [facility] Weeks in Treatment: 12 Constitutional Sitting or standing Blood Pressure is within target range for patient.. Pulse regular and within target range for patient.Marland Kitchen Respirations regular, non-labored and within target range.. Temperature is normal and within the target range for the patient.Marland Kitchen Appears in no distress. Notes Wound exam; patient's wound was not seen today. Total contact cast change in the standard fashion. Electronic Signature(s) Signed: 04/25/2019 5:58:56 PM By: Baltazar Najjar MD Entered By: Baltazar Najjar on 04/25/2019 11:01:47 -------------------------------------------------------------------------------- Physician Orders Details Patient Name: Date of Service: [name]. 04/25/2019 9:15 AM Medical Record [id number] Patient Account Number: [id number] Date of Birth/Sex: Treating RN: May 28, 1955 (63 y.o. [name] Primary Care Provider: Chevis Pretty Other Clinician: Referring Provider:  Treating Provider/Extender:Korrey Schleicher, [name], [facility] Weeks in Treatment: 12 Verbal / Phone Orders: No Diagnosis Coding ICD-10 Coding Code Description E11.621 Type 2 diabetes mellitus with foot ulcer L97.524 Non-pressure chronic ulcer of other part of left foot with necrosis of bone L97.511 Non-pressure chronic ulcer of other part of right foot limited to breakdown of skin L97.514 Non-pressure chronic ulcer of other part of right foot with necrosis of bone E11.42 Type 2 diabetes mellitus with diabetic polyneuropathy M14.671 Charcot's joint, right ankle and foot M14.672 Charcot's joint, left ankle and foot 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 Return Appointment in: - Thursday or Friday for cast change Dressing Change Frequency Wound #10  Left,Lateral,Plantar Foot Change Dressing every other day. Wound #9 Right,Medial Foot Do not change entire dressing for one week. Wound Cleansing May shower with protection. Primary Wound Dressing Wound #10 Left,Lateral,Plantar Foot Calcium Alginate with Silver - lightly pack Wound #9 Right,Medial Foot Collagen - into small divot (depth) under Hydrofera Hydrofera Blue - classic Secondary Dressing Wound #10 Left,Lateral,Plantar Foot Kerlix/Rolled Gauze Dry Gauze Other: - felt callous pad Wound #9 Right,Medial Foot Kerlix/Rolled Gauze Zetuvit or Kerramax Drawtex Carboflex Off-Loading Total Contact Cast to Right Lower Extremity Open toe surgical shoe to: - add felt to sandal also Electronic Signature(s) Signed: 04/25/2019 5:58:56 PM By: Baltazar Najjar MD Signed: 04/25/2019 6:08:37 PM By: Shawn Stall Entered By: Shawn Stall on 04/25/2019 10:50:46 -------------------------------------------------------------------------------- Problem List Details Patient Name: Date of Service: [name]. 04/25/2019 9:15 AM Medical Record [id number] Patient Account Number: [id number] Date of Birth/Sex: Treating RN: 13-Feb-1955 (63 y.o. [name] Primary Care Provider: Other Clinician: Chevis Pretty Referring Provider: Treating Provider/Extender:Marland Reine, [name], [facility] Weeks in Treatment: 12 Active Problems ICD-10 Evaluated Encounter Code Description Active Date Today Diagnosis E11.621 Type 2 diabetes mellitus with foot ulcer 01/28/2019 No Yes L97.524 Non-pressure chronic ulcer of other part of left foot 01/28/2019 No Yes with necrosis of bone L97.511 Non-pressure chronic ulcer of other part of right foot 01/28/2019 No Yes limited to breakdown of skin L97.514 Non-pressure chronic ulcer of other part of right foot 01/28/2019 No Yes with necrosis of bone E11.42 Type 2 diabetes mellitus with diabetic polyneuropathy 01/28/2019 No Yes M14.671 Charcot's  joint, right ankle and foot 01/28/2019 No Yes M14.672 Charcot's joint, left ankle and foot 01/28/2019 No Yes 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 04/25/2019 9:15 AM Medical Record [id number] Patient Account Number: [id number] Date of Birth/Sex: Treating RN: [birth year] (64 y.o. [name] Primary Care Provider: Chevis Pretty Other Clinician: Referring Provider: Treating Provider/Extender:Leatta Alewine, [name], [facility] Weeks in Treatment: 12 Subjective History of Present Illness (HPI) 64 yrs old bf here for follow up recurrent left charcot foot ulcer. She has DM. She is not a smoker. She states a blister developed 3 months ago at site of previous breakdown from 1 year ago. It was debrided at the podiatrist's office. She went to the ER and then sent here. She denies pain. Xray was negative for osteo. Culture from this clinic negative. 09/08/14 Referred by [name] to [name] for Charcot foot. Seen by him and MRI scheduled 09/19/14. Prior silver collagen but this was not ordered last visit, patient has been rinsing with saline alone. Re institute silver collagen  every other day. F/u 2 weeks with Dr. Dellia Nims as Labor Day holiday. Supplies ordered. 09/25/14; this is a patient with a Charcot foot on the left. she has a wound over the plantar aspect of her left calcaneus. She has been seen by [name] of orthopedic surgery and has had an MRI. She is apparently going within the next week or 2 for corrective surgery which will involve an external fixator. I don't have any of the details of this. 10/06/14; The patient is a 64  yrs old bf with a left Charcot foot and ulcer on the plantar. 12/01/14 Returns post surgery with [name]. Has follow up visit later this week with him, currently in facility and NWB over this extremity. States no drainage from foot and no current wound care. On exam callus and scab in place, suspect healed. Has external fixator in place. Will defer to [name] for management, ok to place moisturizing cream over scabs and callus and she will call if she requires follow up here. READMISSION 01/28/2019 Patient was in the clinic in 2016 for a wound on the plantar aspect of her left calcaneus. Predominantly cared for by Dr. [name] she was referred to [name] and had corrective surgery for the position of her left ankle I believe. She had previously been here in 2015 and then prior to that in 2011 2012 that I do not have these records. More recently she has developed fairly extensive wounds on the right medial foot, left lateral foot and a small area on the right medial great toe. Right medial foot wound has been there for 6 to 7 months, left foot wound for 2 to 3 months and the right great toe only over the last few weeks. She has been followed by [name] at Dr. [name] office it sounds as though she has been undergoing callus paring and silver nitrate. The patient has been washing these off with soap and water and plan applying dry dressing Past medical history, type 2 diabetes well controlled with a recent hemoglobin A1c of 7 on 11/16, type 2 diabetes with peripheral neuropathy, previous left Charcot joint surgery bilateral Charcot joints currently, stage III chronic renal failure, hypertension, obstructive sleep apnea, history of MRSA and gastroesophageal reflux. 1/18; this is a patient with bilateral Charcot feet. Plain x-rays that I did of both of these wounds that are either at bone or precariously close to bone were done. On the right foot this showed possible lytic destruction  involving the anterior aspect of the talus which may represent a wound osteomyelitis. An MRI was recommended. On the left there was no definite lytic destruction although the wound is deep undermining's and I think probably requires an MRI on this basis in any case 1/25; patient was supposed to have her MRI last Friday of her bilateral feet however they would not do it because there was silver alginate in her dressings, will replace with calcium alginate today and will see if we can get her done today 2/1; patient did not get her MRIs done last week because of issues with the MRI department receiving orders to do bilateral feet. She has 2 deep wounds in the setting of Charcot feet deformities bilaterally. Both of them at one point had exposed bone although I had trouble demonstrating that today. I am not sure that we can offload these very aggressively as I watched her leave the clinic last week her gait is very narrow and unsteady. 2/15; the MRI of  her right foot did not show osteomyelitis potentially osteonecrosis of the talus. 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 The wounds are a large wound on the right medial foot and on the left a substantial wound on the left lateral plantar calcaneus. We have been using silver alginate. Ultimately I think we need to consider a total contact cast on the right while we work through the possibility of osteomyelitis in the left. I watched her walk out with her crutches I have some trepidation about the ability to walk with the cast on her right foot. Nevertheless with her Charcot deformities I do not think there is a way to heal these short of offloading them aggressively. 2/22; the culture of the bone in the left foot that I did last week showed a few Citrobacter Koseri as well as some streptococcal species. In my attempted bone for pathology did not actually yield a lot of bone the pathologist did not  identify any osteomyelitis. 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 Is promised today and aggressive debridement of the right foot and a total contact cast which surprisingly she seemed to tolerate quite well 03/13/2019 upon evaluation today patient appears to be doing well with her total contact cast she is here for the first cast change. She had no areas of rubbing or any other complications at this point. Overall things are doing quite well. 2/26; the patient was kindly seen by Dr. Orvan Falconer of infectious disease on 2/25. She is now on IV vancomycin PICC line placement in 2 days and oral Flagyl. This was in response to her MRI showing osteomyelitis of the left calcaneus concerning for osteomyelitis. We have been putting her right leg in a total contact cast. Our nurses report drainage. She is tolerating the total contact cast well. 3/4; the patient is started her IV antibiotics and is taking IV vancomycin and oral Flagyl. She appears to be tolerating these both well. This is for osteomyelitis involving the left calcaneus. The area on the right in the setting of bilateral Charcot deformities did not have any bone infection on MRI. We put a total contact cast on her with silver alginate as the primary dressing and she returns today in follow-up. Per our intake nurse there was too much drainage to leave this on all week therefore we will bring her back for an early change 3/8; follow-up total contact cast she is still having a lot of drainage probably too much drainage from the right foot wound will week with the same cast. She will be back on Thursday. The area on the left looks somewhat better 3/11; patient here for a total contact cast change. 3/15; patient came in for wound care evaluation. She is still on IV vancomycin and oral Flagyl for  osteomyelitis in the left foot. The area on the right foot we are putting in a total contact cast. 3/18 for total contact cast change on the right foot 3/22; the patient was here for wound review today. The area on the right foot is slightly smaller in terms of surface area. However the surface of the wound is very gritty and hyper granulated. More problematically the area on the plantar left foot has increased depth indeed in the center of this it probes to bone. We have been using  Hydrofera Blue in both areas. She is on antibiotics as prescribed by infectious disease IV vancomycin and oral Flagyl 3/25; the patient is in for total contact cast change. Our intake nurse was concerned about the amount of drainage which soaked right to the multiple layers we are putting on this. Wondered about options. The wound bed actually looks as healthy as this is looked although there may not be a lot of change in dimensions things are looking a lot better. If we are going to heal this woman's wound which is in the middle of her right Charcot foot this is going to need to be offloaded. There are not many alternatives 3/29; still not a lot of improvement although the surface of the right wound looks better. We have been using Hydrofera Blue 04/17/2019 upon evaluation today patient appears to be doing well with a total contact cast. With actually having to change this twice a week. She saw [name] for wound care earlier in the week this is for the cast change today. That is due to the fact that she has significant drainage. Overall the wound is been looking excellent however. 4/5; we continue to put a total contact cast on this patient with Hydrofera Blue. Still a lot of drainage which precludes a simple weekly change or changing this twice a week. 4/8; total contact cast changed today. Apparently the wound looks better per our intake nurse Objective Constitutional Sitting or standing Blood Pressure is  within target range for patient.. Pulse regular and within target range for patient.Marland Kitchen Respirations regular, non-labored and within target range.. Temperature is normal and within the target range for the patient.Marland Kitchen Appears in no distress. Vitals Time Taken: 9:42 AM, Height: 68 in, Weight: 265 lbs, BMI: 40.3, Temperature: 98.2 F, Pulse: 79 bpm, Respiratory Rate: 18 breaths/min, Blood Pressure: 142/64 mmHg. General Notes: Wound exam; patient's wound was not seen today. Total contact cast change in the standard fashion. Integumentary (Hair, Skin) Wound #10 status is Open. Original cause of wound was Blister. The wound is located on the Left,Lateral,Plantar Foot. The wound measures 1.6cm length x 1.6cm width x 2.3cm depth; 2.011cm^2 area and 4.624cm^3 volume. There is bone, muscle, and Fat Layer (Subcutaneous Tissue) Exposed exposed. There is no tunneling or undermining noted. There is a medium amount of serosanguineous drainage noted. The wound margin is thickened. There is large (67-100%) red, pink granulation within the wound bed. There is no necrotic tissue within the wound bed. Wound #9 status is Open. Original cause of wound was Blister. The wound is located on the Right,Medial Foot. The wound measures 3.1cm length x 1.7cm width x 0.2cm depth; 4.139cm^2 area and 0.828cm^3 volume. There is Fat Layer (Subcutaneous Tissue) Exposed exposed. There is no tunneling or undermining noted. There is a large amount of serosanguineous drainage noted. The wound margin is thickened. There is large (67-100%) red, pink, hyper - granulation within the wound bed. There is no necrotic tissue within the wound bed. 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 There was a Total Contact Cast Procedure by [name], MD. Post procedure Diagnosis Wound #9: Same as Pre-Procedure Plan Follow-up Appointments: Return Appointment in 1 week. Return Appointment in: - Thursday or Friday for cast change Dressing Change Frequency: Wound #10 Left,Lateral,Plantar Foot: Change Dressing every other day. Wound #9 Right,Medial Foot: Do not change entire dressing for one week. Wound Cleansing: May shower with protection. Primary Wound Dressing: Wound #10 Left,Lateral,Plantar Foot: Calcium Alginate with Silver - lightly pack Wound #9 Right,Medial Foot: Collagen - into small divot (depth) under Hydrofera Hydrofera Blue - classic Secondary Dressing: Wound #10 Left,Lateral,Plantar Foot: Kerlix/Rolled Gauze Dry Gauze Other: - felt callous pad Wound #9 Right,Medial Foot: Kerlix/Rolled Gauze Zetuvit or Kerramax Drawtex Carboflex Off-Loading: Total Contact Cast to Right Lower Extremity Open toe surgical shoe to: - add felt to sandal also 1. Total contact cast change. We will see the wound later in the week Electronic Signature(s) Signed: 04/25/2019 5:58:56 PM By: Baltazar Najjar MD Entered By: Baltazar Najjar on 04/25/2019 11:02:26 -------------------------------------------------------------------------------- Total Contact Cast Details Patient Name: Date of Service: [name], [name] 04/25/2019 9:15 AM Medical Record [id number] Patient Account Number: [id number] Date of Birth/Sex: Treating RN: [birth year] (63 y.o. [name] Primary Care Provider: Chevis Pretty Other Clinician: Referring  Provider: Chevis Pretty Treating Provider/Extender:Tamaj Jurgens, Lamar Sprinkles in Treatment: 12 Total Contact Cast Applied for Wound Assessment: Wound #9 Right,Medial Foot Performed By: Physician [name], MD Post Procedure Diagnosis Same as Pre-procedure Electronic Signature(s) Signed: 04/25/2019 5:58:56 PM By: Baltazar Najjar MD Signed: 04/25/2019 6:08:37 PM By: Shawn Stall Entered By: Shawn Stall on 04/25/2019 10:51:10 -------------------------------------------------------------------------------- SuperBill Details Patient Name: Date of Service: [name] 04/25/2019 Medical Record [id number] Patient Account Number: [id number] Date of Birth/Sex: Treating RN: 18-Mar-1955 (63 y.o. Debara Pickett, Yvonne Kendall Primary Care Provider: Chevis Pretty Other Clinician: Referring Provider: Treating Provider/Extender:Tillie Viverette, [name], [facility] Weeks in Treatment: 12 Diagnosis Coding ICD-10 Codes Code Description E11.621 Type 2 diabetes mellitus with foot ulcer L97.524 Non-pressure chronic ulcer of other part of left foot with necrosis of bone L97.511 Non-pressure chronic ulcer of other part of right foot limited to breakdown of skin L97.514 Non-pressure chronic ulcer of other part of right foot with necrosis of bone E11.42 Type 2 diabetes mellitus with diabetic polyneuropathy M14.671 Charcot's joint, right ankle and foot M14.672 Charcot's joint, left ankle and foot 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, left ankle and foot Facility Procedures CPT4 Code Description: [id number] - APPLY TOTAL CONTACT LEG CAST ICD-10 Diagnosis Description L97.511 Non-pressure chronic ulcer of other part of right foot limit Modifier: ed to breakdo Quantity: 1 wn of skin Physician Procedures CPT4 Code Description: [id number] - WC PHYS APPLY TOTAL CONTACT CAST ICD-10 Diagnosis Description L97.511 Non-pressure chronic ulcer of other part of right foot limite Modifier: d to  breakdo Quantity: 1 wn of skin Electronic Signature(s) Signed: 04/25/2019 5:58:56 PM By: Baltazar Najjar MD Entered By: Baltazar Najjar on 04/25/2019 11:02:37

## 2019-04-29 ENCOUNTER — Encounter (HOSPITAL_BASED_OUTPATIENT_CLINIC_OR_DEPARTMENT_OTHER): Payer: Medicare Other | Admitting: Internal Medicine

## 2019-04-29 ENCOUNTER — Telehealth: Payer: Self-pay

## 2019-04-29 ENCOUNTER — Other Ambulatory Visit: Payer: Self-pay

## 2019-04-29 DIAGNOSIS — E11621 Type 2 diabetes mellitus with foot ulcer: Secondary | ICD-10-CM | POA: Diagnosis not present

## 2019-04-29 NOTE — Telephone Encounter (Signed)
Patient calling after visit with wound center. She feels her wound is not progressing as it should but is somewhat better and she would like to extend IV antibiotics until her office visit on 05-15-2019 .  She is currently scheduled for completion on 05-01-2019.   Please advise.  Laurell Josephs, RN

## 2019-04-29 NOTE — Telephone Encounter (Signed)
Patient states the home health nurse is there to remove her PICC and her time was extended at the last visit.  I reviewed her medical recordss and Dr Orvan Falconer extended to IV antibiotics through 05-01-2019.  Nurse advised not to remove PICC.  Patient advised to follow up as scheduled.   Laurell Josephs , RN

## 2019-04-30 LAB — HM DIABETES EYE EXAM

## 2019-04-30 NOTE — Progress Notes (Signed)
[name], [name] ([id number]) Visit Report for 04/29/2019 Debridement Details Patient Name: Date of Service: [name] 04/29/2019 2:30 PM Medical Record [id number] Patient Account Number: [id number] Date of Birth/Sex: Treating RN: [name] (64 y.o. [name] Primary Care Provider: Chevis [name] Other Clinician: Referring Provider: Chevis [name] Treating Provider/Extender:[name] in Treatment: 13 Debridement Performed for Wound #10 Left,Lateral,Plantar Foot Assessment: Performed By: Physician Sarah Caul., MD Debridement Type: Debridement Severity of Tissue Pre Fat layer exposed Debridement: Level of Consciousness (Pre- Awake and Alert procedure): Pre-procedure Verification/Time Out Taken: Yes - 15:36 Start Time: 15:36 Total Area Debrided (L x W): 1.5 (cm) x 1.5 (cm) = 2.25 (cm) Tissue and other material Viable, Non-Viable, Callus, Subcutaneous debrided: Level: Skin/Subcutaneous Tissue Debridement Description: Excisional Instrument: Curette Bleeding: Moderate Hemostasis Achieved: Pressure End Time: 15:39 Procedural Pain: 0 Post Procedural Pain: 0 Response to Treatment: Procedure was tolerated well Level of Consciousness Awake and Alert (Post-procedure): Post Debridement Measurements of Total Wound Length: (cm) 1.5 Width: (cm) 1.5 Depth: (cm) 1.9 Volume: (cm) 3.358 Character of Wound/Ulcer Post Improved Debridement: Severity of Tissue Post Debridement: Fat layer exposed Post Procedure Diagnosis Same as Pre-procedure Electronic Signature(s) Signed: 04/29/2019 6:12:10 PM By: Sarah Abts RN, BSN Signed: 04/30/2019 5:51:00 PM By: Sarah Najjar MD Entered By: [name] on 04/29/2019 16:32:28 -------------------------------------------------------------------------------- Debridement Details Patient Name: Date of Service: [name]. 04/29/2019 2:30 PM Medical Record [id number] Patient  Account Number: [id number] Date of Birth/Sex: Treating RN: [name] (64 y.o. [name] Primary Care Provider: Chevis [name] Other Clinician: Referring Provider: Chevis [name] Treating Provider/Extender:[name] in Treatment: 13 Debridement Performed for Wound #9 Right,Medial Foot Assessment: Performed By: Physician Sarah Caul., MD Debridement Type: Debridement Severity of Tissue Pre Fat layer exposed Debridement: Level of Consciousness (Pre- Awake and Alert procedure): Pre-procedure Yes - 15:36 Verification/Time Out Taken: Start Time: 15:36 Total Area Debrided (L x W): 3.8 (cm) x 3 (cm) = 11.4 (cm) Tissue and other material Viable, Non-Viable, Callus, Subcutaneous debrided: Level: Skin/Subcutaneous Tissue Debridement Description: Excisional Instrument: Curette Bleeding: Moderate Hemostasis Achieved: Pressure End Time: 15:39 Procedural Pain: 0 Post Procedural Pain: 0 Response to Treatment: Procedure was tolerated well Level of Consciousness Awake and Alert (Post-procedure): Post Debridement Measurements of Total Wound Length: (cm) 3.8 Width: (cm) 3 Depth: (cm) 0.5 Volume: (cm) 4.477 Character of Wound/Ulcer Post Improved Debridement: Severity of Tissue Post Debridement: Fat layer exposed Post Procedure Diagnosis Same as Pre-procedure Electronic Signature(s) Signed: 04/29/2019 6:12:10 PM By: Sarah Abts RN, BSN Signed: 04/30/2019 5:51:00 PM By: Sarah Najjar MD Entered By: [name] on 04/29/2019 16:32:38 -------------------------------------------------------------------------------- HPI Details Patient Name: Date of Service: [name]. 04/29/2019 2:30 PM Medical Record [id number] Patient Account Number: [id number] Date of Birth/Sex: Treating RN: [name] (64 y.o. [name] Primary Care Provider: Chevis [name] Other Clinician: Referring Provider: Treating  Provider/Extender:[name] Weeks in Treatment: 13 History of Present Illness HPI Description: 64 yrs old bf here for follow up recurrent left charcot foot ulcer. She has DM. She is not a smoker. She states a blister developed 3 months ago at site of previous breakdown from 1 year ago. It was debrided at the podiatrist's office. She went to the ER and then sent here. She denies pain. Xray was negative for osteo. Culture from this clinic negative. 09/08/14 Referred by [name] to [name] for Charcot foot. Seen by him and MRI scheduled 09/19/14. Prior silver collagen but this was not ordered last visit, patient has been rinsing  with saline alone. Re institute silver collagen every other day. F/u 2 weeks with [name] as Labor Day holiday. Supplies ordered. 09/25/14; this is a patient with a Charcot foot on the left. she has a wound over the plantar aspect of her left calcaneus. She has been seen by [name] of orthopedic surgery and has had an MRI. She is apparently going within the next week or 2 for corrective surgery which will involve an external fixator. I don't have any of the details of this. 10/06/14; The patient is a 64 yrs old bf with a left Charcot foot and ulcer on the plantar. 12/01/14 Returns post surgery with [name]. Has follow up visit later this week with him, currently in facility and NWB over this extremity. States no drainage from foot and no current wound care. On exam callus and scab in place, suspect healed. Has external fixator in place. Will defer to [name] for management, ok to place moisturizing cream over scabs and callus and she will call if she requires follow up here. READMISSION 01/28/2019 Patient was in the clinic in 2016 for a wound on the plantar aspect of her left calcaneus. Predominantly cared for by [name] she was referred to [name] and had corrective surgery for the position of her left ankle I believe. She  had previously been here in 2015 and then prior to that in 2011 2012 that I do not have these records. More recently she has developed fairly extensive wounds on the right medial foot, left lateral foot and a small area on the right medial great toe. Right medial foot wound has been there for 6 to 7 months, left foot wound for 2 to 3 months and the right great toe only over the last few weeks. She has been followed by [name] at Dr. Laverta [place] office it sounds as though she has been undergoing callus paring and silver nitrate. The patient has been washing these off with soap and water and plan applying dry dressing Past medical history, type 2 diabetes well controlled with a recent hemoglobin A1c of 7 on 11/16, type 2 diabetes with peripheral neuropathy, previous left Charcot joint surgery bilateral Charcot joints currently, stage III chronic renal failure, hypertension, obstructive sleep apnea, history of MRSA and gastroesophageal reflux. 1/18; this is a patient with bilateral Charcot feet. Plain x-rays that I did of both of these wounds that are either at bone or precariously close to bone were done. On the right foot this showed possible lytic destruction involving the anterior aspect of the talus which may represent a wound osteomyelitis. An MRI was recommended. On the left there was no definite lytic destruction although the wound is deep undermining's and I think probably requires an MRI on this basis in any case 1/25; patient was supposed to have her MRI last Friday of her bilateral feet however they would not do it because there was silver alginate in her dressings, will replace with calcium alginate today and will see if we can get her done today 2/1; patient did not get her MRIs done last week because of issues with the MRI department receiving orders to do bilateral feet. She has 2 deep wounds in the setting of Charcot feet deformities bilaterally. Both of them at one point had  exposed bone although I had trouble demonstrating that today. I am not sure that we can offload these very aggressively as I watched her leave the clinic last week her gait is very  narrow and unsteady. 2/15; the MRI of her right foot did not show osteomyelitis potentially osteonecrosis of the talus. 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 The wounds are a large wound on the right medial foot and on the left a substantial wound on the left lateral plantar calcaneus. We have been using silver alginate. Ultimately I think we need to consider a total contact cast on the right while we work through the possibility of osteomyelitis in the left. I watched her walk out with her crutches I have some trepidation about the ability to walk with the cast on her right foot. Nevertheless with her Charcot deformities I do not think there is a way to heal these short of offloading them aggressively. 2/22; the culture of the bone in the left foot that I did last week showed a few Citrobacter Koseri as well as some streptococcal species. In my attempted bone for pathology did not actually yield a lot of bone the pathologist did not identify any osteomyelitis. 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 Is promised today and aggressive debridement of the right foot and a total contact cast which surprisingly she seemed to tolerate quite well 03/13/2019 upon evaluation today patient appears to be doing well with her total contact cast she is here for the first cast change. She had no areas of rubbing or any other complications at this point. Overall things are doing quite well. 2/26; the patient was kindly seen by Dr. Orvan Falconer of infectious disease on 2/25. She is now on IV vancomycin PICC line placement in 2 days and oral  Flagyl. This was in response to her MRI showing osteomyelitis of the left calcaneus concerning for osteomyelitis. We have been putting her right leg in a total contact cast. Our nurses report drainage. She is tolerating the total contact cast well. 3/4; the patient is started her IV antibiotics and is taking IV vancomycin and oral Flagyl. She appears to be tolerating these both well. This is for osteomyelitis involving the left calcaneus. The area on the right in the setting of bilateral Charcot deformities did not have any bone infection on MRI. We put a total contact cast on her with silver alginate as the primary dressing and she returns today in follow-up. Per our intake nurse there was too much drainage to leave this on all week therefore we will bring her back for an early change 3/8; follow-up total contact cast she is still having a lot of drainage probably too much drainage from the right foot wound will week with the same cast. She will be back on Thursday. The area on the left looks somewhat better 3/11; patient here for a total contact cast change. 3/15; patient came in for wound care evaluation. She is still on IV vancomycin and oral Flagyl for osteomyelitis in the left foot. The area on the right foot we are putting in a total contact cast. 3/18 for total contact cast change on the right foot 3/22; the patient was here for wound review today. The area on the right foot is slightly smaller in terms of surface area. However the surface of the wound is very gritty and hyper granulated. More problematically the area on the plantar left foot has increased depth indeed in the [name] of this it probes  to bone. We have been using Hydrofera Blue in both areas. She is on antibiotics as prescribed by infectious disease IV vancomycin and oral Flagyl 3/25; the patient is in for total contact cast change. Our intake nurse was concerned about the amount of drainage which soaked right to the  multiple layers we are putting on this. Wondered about options. The wound bed actually looks as healthy as this is looked although there may not be a lot of change in dimensions things are looking a lot better. If we are going to heal this woman's wound which is in the middle of her right Charcot foot this is going to need to be offloaded. There are not many alternatives 3/29; still not a lot of improvement although the surface of the right wound looks better. We have been using Hydrofera Blue 04/17/2019 upon evaluation today patient appears to be doing well with a total contact cast. With actually having to change this twice a week. She saw [name] for wound care earlier in the week this is for the cast change today. That is due to the fact that she has significant drainage. Overall the wound is been looking excellent however. 4/5; we continue to put a total contact cast on this patient with Hydrofera Blue. Still a lot of drainage which precludes a simple weekly change or changing this twice a week. 4/8; total contact cast changed today. Apparently the wound looks better per our intake nurse 4/12; patient here for wound care evaluation. Wounds on the right foot have not changed in dimension none about a month left foot looks similar. Apparently her antibiotics that we are giving her for osteomyelitis in the left foot complete on Wednesday. We have been using Hydrofera Blue on the right foot under a total contact cast. Nurses report greenish drainage although I think this may be discoloration from the Electronic Signature(s) Signed: 04/30/2019 5:51:00 PM By: Sarah Najjar MD Entered By: [name] on 04/29/2019 16:33:53 -------------------------------------------------------------------------------- Physical Exam Details Patient Name: Date of Service: [name] 04/29/2019 2:30 PM Medical Record [id number] Patient Account Number: [id number] Date of Birth/Sex: Treating  RN: [name] (64 y.o. [name] Primary Care Provider: Chevis [name] Other Clinician: Referring Provider: Treating Provider/Extender:Alaine Loughney, [name], [name] Weeks in Treatment: 13 Constitutional Sitting or standing Blood Pressure is within target range for patient.. Pulse regular and within target range for patient.Marland Kitchen Respirations regular, non-labored and within target range.. Temperature is normal and within the target range for the patient.Marland Kitchen Appears in no distress. Notes Wound exam; the wounds are essentially the same in size and morphology. On both sides extensive debridement with a #5 curette removing raised senescent edges bilaterally on the right debridement of the gritty surface eschar and extensive debridement. Hemostasis with a pressure dressing on the left not much bleeding. We discovered a divot from 6-9 o'clock which is fairly deep Electronic Signature(s) Signed: 04/30/2019 5:51:00 PM By: Sarah Najjar MD Entered By: [name] on 04/29/2019 16:34:54 -------------------------------------------------------------------------------- Physician Orders Details Patient Name: Date of Service: [name]. 04/29/2019 2:30 PM Medical Record [id number] Patient Account Number: [id number] Date of Birth/Sex: Treating RN: [name] (63 y.o. [name] Primary Care Provider: Chevis [name] Other Clinician: Referring Provider: Treating Provider/Extender:Lular Letson, [name], [facility] Weeks in Treatment: 27 Verbal / Phone Orders: No Diagnosis Coding ICD-10 Coding Code Description E11.621 Type 2 diabetes mellitus with foot ulcer L97.524 Non-pressure chronic ulcer of other part of left foot with necrosis of bone L97.511 Non-pressure chronic ulcer of other  part of right foot limited to breakdown of skin L97.514 Non-pressure chronic ulcer of other part of right foot with necrosis of bone E11.42 Type 2 diabetes  mellitus with diabetic polyneuropathy M14.671 Charcot's joint, right ankle and foot M14.672 Charcot's joint, left ankle and foot 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 - MD visit Return Appointment in: - Thursday or Friday for cast change Dressing Change Frequency Wound #10 Left,Lateral,Plantar Foot Change Dressing every other day. Wound #9 Right,Medial Foot Do not change entire dressing for one week. Wound Cleansing May shower with protection. Primary Wound Dressing Wound #10 Left,Lateral,Plantar Foot Calcium Alginate with Silver - lightly pack Wound #9 Right,Medial Foot Calcium Alginate with Silver Secondary Dressing Wound #10 Left,Lateral,Plantar Foot Kerlix/Rolled Gauze Dry Gauze Other: - felt callous pad Wound #9 Right,Medial Foot Kerlix/Rolled Gauze Zetuvit or Kerramax Drawtex Carboflex Off-Loading Total Contact Cast to Right Lower Extremity Open toe surgical shoe to: - add felt to sandal also Electronic Signature(s) Signed: 04/29/2019 6:12:10 PM By: Levan Hurst RN, BSN Signed: 04/30/2019 5:51:00 PM By: Linton Ham MD Entered By: Levan Hurst on 04/29/2019 15:44:04 -------------------------------------------------------------------------------- Problem List Details Patient Name: Date of Service: [name]. 04/29/2019 2:30 PM Medical Record [id number] Patient Account Number: [id number] Date of Birth/Sex: Treating RN: [birth year] [name] (64 y.o. [name] Primary Care Provider: Dewayne Hatch Other Clinician: Referring Provider: Treating Provider/Extender:Caellum Mancil, [name], [facility] Weeks in Treatment: 13 Active Problems ICD-10 Evaluated Encounter Code Description Active Date Today Diagnosis E11.621 Type 2 diabetes mellitus with foot ulcer 01/28/2019 No Yes L97.524 Non-pressure chronic ulcer of other part of left foot 01/28/2019 No Yes with necrosis of  bone L97.511 Non-pressure chronic ulcer of other part of right foot 01/28/2019 No Yes limited to breakdown of skin L97.514 Non-pressure chronic ulcer of other part of right foot 01/28/2019 No Yes with necrosis of bone E11.42 Type 2 diabetes mellitus with diabetic polyneuropathy 01/28/2019 No Yes M14.671 Charcot's joint, right ankle and foot 01/28/2019 No Yes M14.672 Charcot's joint, left ankle and foot 01/28/2019 No Yes 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 04/29/2019 2:30 PM Medical Record [id number] Patient Account Number: [id number] Date of Birth/Sex: Treating RN: [birth year]-[name] (64 y.o. [name] Primary Care Provider: Dewayne Hatch Other Clinician: Referring Provider: Treating Provider/Extender:Cailen Texeira, [name], [facility] Weeks in Treatment: 13 Subjective History of Present Illness (HPI) 64 yrs old bf here for follow up recurrent left charcot foot ulcer. She has DM. She is not a smoker. She states a blister developed 3 months ago at site of previous breakdown from 1 year ago. It was debrided at the podiatrist's office. She went to the ER and then sent here. She denies pain. Xray was negative for osteo. Culture from this clinic negative. 09/08/14 Referred by [name] to [name] for Charcot foot. Seen by him and MRI scheduled 09/19/14. Prior silver collagen but this was not ordered last visit, patient has been rinsing with saline alone. Re institute silver collagen every other day. F/u 2 weeks with Dr. Dellia Nims as Labor Day holiday. Supplies ordered. 09/25/14; this is a patient with a Charcot foot on the left.  she has a wound over the plantar aspect of her left calcaneus. She has been seen by [name] of orthopedic surgery and  has had an MRI. She is apparently going within the next week or 2 for corrective surgery which will involve an external fixator. I don't have any of the details of this. 10/06/14; The patient is a 64 yrs old bf with a left Charcot foot and ulcer on the plantar. 12/01/14 Returns post surgery with [name]. Has follow up visit later this week with him, currently in facility and NWB over this extremity. States no drainage from foot and no current wound care. On exam callus and scab in place, suspect healed. Has external fixator in place. Will defer to [name] for management, ok to place moisturizing cream over scabs and callus and she will call if she requires follow up here. READMISSION 01/28/2019 Patient was in the clinic in 2016 for a wound on the plantar aspect of her left calcaneus. Predominantly cared for by [name] she was referred to [name] and had corrective surgery for the position of her left ankle I believe. She had previously been here in 2015 and then prior to that in 2011 2012 that I do not have these records. More recently she has developed fairly extensive wounds on the right medial foot, left lateral foot and a small area on the right medial great toe. Right medial foot wound has been there for 6 to 7 months, left foot wound for 2 to 3 months and the right great toe only over the last few weeks. She has been followed by [name] at Dr. Laverta [place] office it sounds as though she has been undergoing callus paring and silver nitrate. The patient has been washing these off with soap and water and plan applying dry dressing Past medical history, type 2 diabetes well controlled with a recent hemoglobin A1c of 7 on 11/16, type 2 diabetes with peripheral neuropathy, previous left Charcot joint surgery bilateral Charcot joints currently, stage III chronic  renal failure, hypertension, obstructive sleep apnea, history of MRSA and gastroesophageal reflux. 1/18; this is a patient with bilateral Charcot feet. Plain x-rays that I did of both of these wounds that are either at bone or precariously close to bone were done. On the right foot this showed possible lytic destruction involving the anterior aspect of the talus which may represent a wound osteomyelitis. An MRI was recommended. On the left there was no definite lytic destruction although the wound is deep undermining's and I think probably requires an MRI on this basis in any case 1/25; patient was supposed to have her MRI last Friday of her bilateral feet however they would not do it because there was silver alginate in her dressings, will replace with calcium alginate today and will see if we can get her done today 2/1; patient did not get her MRIs done last week because of issues with the MRI department receiving orders to do bilateral feet. She has 2 deep wounds in the setting of Charcot feet deformities bilaterally. Both of them at one point had exposed bone although I had trouble demonstrating that today. I am not sure that we can offload these very aggressively as I watched her leave the clinic last week her gait is very narrow and unsteady. 2/15; the MRI of her right foot did not show osteomyelitis potentially osteonecrosis of the talus. 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 The wounds are a large wound on the right medial foot and on the left a substantial wound on the left lateral plantar calcaneus. We  have been using silver alginate. Ultimately I think we need to consider a total contact cast on the right while we work through the possibility of osteomyelitis in the left. I watched her walk out with her crutches I have some trepidation about the ability to walk with the cast on her right foot. Nevertheless with her Charcot deformities  I do not think there is a way to heal these short of offloading them aggressively. 2/22; the culture of the bone in the left foot that I did last week showed a few Citrobacter Koseri as well as some streptococcal species. In my attempted bone for pathology did not actually yield a lot of bone the pathologist did not identify any osteomyelitis. 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 Is promised today and aggressive debridement of the right foot and a total contact cast which surprisingly she seemed to tolerate quite well 03/13/2019 upon evaluation today patient appears to be doing well with her total contact cast she is here for the first cast change. She had no areas of rubbing or any other complications at this point. Overall things are doing quite well. 2/26; the patient was kindly seen by Dr. Orvan Falconer of infectious disease on 2/25. She is now on IV vancomycin PICC line placement in 2 days and oral Flagyl. This was in response to her MRI showing osteomyelitis of the left calcaneus concerning for osteomyelitis. We have been putting her right leg in a total contact cast. Our nurses report drainage. She is tolerating the total contact cast well. 3/4; the patient is started her IV antibiotics and is taking IV vancomycin and oral Flagyl. She appears to be tolerating these both well. This is for osteomyelitis involving the left calcaneus. The area on the right in the setting of bilateral Charcot deformities did not have any bone infection on MRI. We put a total contact cast on her with silver alginate as the primary dressing and she returns today in follow-up. Per our intake nurse there was too much drainage to leave this on all week therefore we will bring her back for an early change 3/8; follow-up total contact cast she is still having a lot of  drainage probably too much drainage from the right foot wound will week with the same cast. She will be back on Thursday. The area on the left looks somewhat better 3/11; patient here for a total contact cast change. 3/15; patient came in for wound care evaluation. She is still on IV vancomycin and oral Flagyl for osteomyelitis in the left foot. The area on the right foot we are putting in a total contact cast. 3/18 for total contact cast change on the right foot 3/22; the patient was here for wound review today. The area on the right foot is slightly smaller in terms of surface area. However the surface of the wound is very gritty and hyper granulated. More problematically the area on the plantar left foot has increased depth indeed in the [name] of this it probes to bone. We have been using Hydrofera Blue in both areas. She is on antibiotics as prescribed by infectious disease IV vancomycin and oral Flagyl 3/25; the patient is in for total contact cast change. Our intake nurse was concerned about the amount of drainage which soaked right to the multiple layers we are putting on this.  Wondered about options. The wound bed actually looks as healthy as this is looked although there may not be a lot of change in dimensions things are looking a lot better. If we are going to heal this woman's wound which is in the middle of her right Charcot foot this is going to need to be offloaded. There are not many alternatives 3/29; still not a lot of improvement although the surface of the right wound looks better. We have been using Hydrofera Blue 04/17/2019 upon evaluation today patient appears to be doing well with a total contact cast. With actually having to change this twice a week. She saw [name] for wound care earlier in the week this is for the cast change today. That is due to the fact that she has significant drainage. Overall the wound is been looking excellent however. 4/5; we continue to put  a total contact cast on this patient with Hydrofera Blue. Still a lot of drainage which precludes a simple weekly change or changing this twice a week. 4/8; total contact cast changed today. Apparently the wound looks better per our intake nurse 4/12; patient here for wound care evaluation. Wounds on the right foot have not changed in dimension none about a month left foot looks similar. Apparently her antibiotics that we are giving her for osteomyelitis in the left foot complete on Wednesday. We have been using Hydrofera Blue on the right foot under a total contact cast. Nurses report greenish drainage although I think this may be discoloration from the Objective Constitutional Sitting or standing Blood Pressure is within target range for patient.. Pulse regular and within target range for patient.Marland Kitchen Respirations regular, non-labored and within target range.. Temperature is normal and within the target range for the patient.Marland Kitchen Appears in no distress. Vitals Time Taken: 2:41 PM, Height: 68 in, Source: Stated, Weight: 265 lbs, Source: Stated, BMI: 40.3, Temperature: 98.1 F, Pulse: 74 bpm, Respiratory Rate: 18 breaths/min, Blood Pressure: 110/50 mmHg. General Notes: Wound exam; the wounds are essentially the same in size and morphology. On both sides extensive debridement with a #5 curette removing raised senescent edges bilaterally on the right debridement of the gritty surface eschar and extensive debridement. Hemostasis with a pressure dressing on the left not much bleeding. We discovered a divot from 6-9 o'clock which is fairly deep Integumentary (Hair, Skin) Wound #10 status is Open. Original cause of wound was Blister. The wound is located on the Left,Lateral,Plantar Foot. The wound measures 1.5cm length x 1.5cm width x 1.9cm depth; 1.767cm^2 area and 3.358cm^3 volume. There is muscle and Fat Layer (Subcutaneous Tissue) Exposed exposed. There is no tunneling or undermining noted. There is a  medium amount of serosanguineous drainage noted. The wound margin is thickened. There is large (67-100%) red, pink granulation within the wound bed. There is no necrotic tissue within the wound bed. Wound #9 status is Open. Original cause of wound was Blister. The wound is located on the Right,Medial Foot. The wound measures 3.8cm length x 3cm width x 0.5cm depth; 8.954cm^2 area and 4.477cm^3 volume. There is Fat Layer (Subcutaneous Tissue) Exposed exposed. There is no tunneling or undermining noted. There is a large amount of purulent drainage noted. The wound margin is thickened. There is large (67-100%) red, pink, hyper - granulation within the wound bed. There is no necrotic tissue within the wound bed. 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 There was a Excisional Skin/Subcutaneous Tissue Debridement with a total area of 2.25 sq cm performed by Sarah Caul., MD. With the following instrument(s): Curette to remove Viable and Non-Viable tissue/material. Material removed includes Callus and Subcutaneous Tissue and. No specimens were taken. A time out was conducted at 15:36, prior to the start of the procedure. A Moderate amount of bleeding was controlled with Pressure. The procedure was tolerated well with a pain level of 0 throughout and  a pain level of 0 following the procedure. Post Debridement Measurements: 1.5cm length x 1.5cm width x 1.9cm depth; 3.358cm^3 volume. Character of Wound/Ulcer Post Debridement is improved. Severity of Tissue Post Debridement is: Fat layer exposed. Post procedure Diagnosis Wound #10: Same as Pre-Procedure Wound #9 Pre-procedure diagnosis of Wound #9 is a Diabetic Wound/Ulcer of the Lower Extremity located on the Right,Medial Foot .Severity of Tissue Pre Debridement is: Fat layer exposed. There was a Excisional Skin/Subcutaneous Tissue Debridement with a total area of 11.4 sq cm performed by Sarah Caul., MD. With the following instrument(s): Curette to remove Viable and Non-Viable tissue/material. Material removed includes Callus and Subcutaneous Tissue and. No specimens were taken. A time out was conducted at 15:36, prior to the start of the procedure. A Moderate amount of bleeding was controlled with Pressure. The procedure was tolerated well with a pain level of 0 throughout and a pain level of 0 following the procedure. Post Debridement Measurements: 3.8cm length x 3cm width x 0.5cm depth; 4.477cm^3 volume. Character of Wound/Ulcer Post Debridement is improved. Severity of Tissue Post Debridement is: Fat layer exposed. Post procedure Diagnosis Wound #9: Same as Pre-Procedure Pre-procedure diagnosis of Wound #9 is a Diabetic Wound/Ulcer of the Lower Extremity located on the Right,Medial Foot . There was a Total Contact Cast Procedure by Sarah Caul., MD. Post procedure Diagnosis Wound #9: Same as Pre-Procedure Plan Follow-up Appointments: Return Appointment in 1 week. - MD visit Return Appointment in: - Thursday or Friday for cast change Dressing Change Frequency: Wound #10 Left,Lateral,Plantar Foot: Change Dressing every other day. Wound #9 Right,Medial Foot: Do not change entire dressing for one week. Wound Cleansing: May shower with protection. Primary Wound  Dressing: Wound #10 Left,Lateral,Plantar Foot: Calcium Alginate with Silver - lightly pack Wound #9 Right,Medial Foot: Calcium Alginate with Silver Secondary Dressing: Wound #10 Left,Lateral,Plantar Foot: Kerlix/Rolled Gauze Dry Gauze Other: - felt callous pad Wound #9 Right,Medial Foot: Kerlix/Rolled Gauze Zetuvit or Kerramax Drawtex Carboflex Off-Loading: Total Contact Cast to Right Lower Extremity Open toe surgical shoe to: - add felt to sandal also 1. I change the primary dressing to silver alginate bilaterally including the wound on the right and the 6-9 o'clock to been on the left 2. I did not see any evidence of infection. I am hopeful that an extensive debridement on the right will allow some degree of epithelialization 3. I really hoped that the area on the right might be closed by the time she ran out of antibiotics on the left and therefore we could put  her at left foot and a total contact cast but we are nowhere close to this. 4. Look at infectious disease notes Electronic Signature(s) Signed: 04/30/2019 5:51:00 PM By: Sarah Najjar MD Entered By: [name] on 04/29/2019 16:35:59 -------------------------------------------------------------------------------- Total Contact Cast Details Patient Name: Date of Service: [name], [name] 04/29/2019 2:30 PM Medical Record [id number] Patient Account Number: [id number] Date of Birth/Sex: Treating RN: [birth year] (63 y.o. [name] Primary Care Provider: Chevis [name] Other Clinician: Referring Provider: Chevis [name] Treating Provider/Extender:Tyrrell Stephens, [name] in Treatment: 13 Total Contact Cast Applied for Wound Assessment: Wound #9 Right,Medial Foot Performed By: Physician Sarah Caul., MD Post Procedure Diagnosis Same as Pre-procedure Electronic Signature(s) Signed: 04/30/2019 5:51:00 PM By: Sarah Najjar MD Entered By: [name] on 04/29/2019  16:33:01 -------------------------------------------------------------------------------- SuperBill Details Patient Name: Date of Service: [name] 04/29/2019 Medical Record [id number] Patient Account Number: [id number] Date of Birth/Sex: Treating RN: [name][birth year] (63 y.o. [name] Primary Care Provider: Chevis [name] Other Clinician: Referring Provider: Treating Provider/Extender:Darold Miley, [name], [facility] [name] Weeks in Treatment: 13 Diagnosis Coding ICD-10 Codes Code Description E11.621 Type 2 diabetes mellitus with foot ulcer L97.524 Non-pressure chronic ulcer of other part of left foot with necrosis of bone L97.511 Non-pressure chronic ulcer of other part of right foot limited to breakdown of skin L97.514 Non-pressure chronic ulcer of other part of right foot with necrosis of bone E11.42 Type 2 diabetes mellitus with diabetic polyneuropathy M14.671 Charcot's joint, right ankle and foot M14.672 Charcot's joint, left ankle and foot 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, left ankle and foot Facility Procedures CPT4 Code Description: [id number] 11042 - DEB SUBQ TISSUE 20 SQ CM/< ICD-10 Diagnosis Description L97.524 Non-pressure chronic ulcer of other part of left foot with L97.511 Non-pressure chronic ulcer of other part of right foot limi Modifier: necrosis of bo ted to breakdo Quantity: 1 ne wn of skin Physician Procedures CPT4 Code Description: [id number] 11042 - WC PHYS SUBQ TISS 20 SQ CM ICD-10 Diagnosis Description L97.524 Non-pressure chronic ulcer of other part of left foot with L97.511 Non-pressure chronic ulcer of other part of right foot limi Modifier: necrosis of bo ted to breakdo Quantity: 1 ne wn of skin Electronic Signature(s) Signed: 04/30/2019 5:51:00 PM By: Sarah Najjar MD Entered By: [name] on 04/29/2019 16:36:33

## 2019-04-30 NOTE — Progress Notes (Signed)
Sarah, [name] ([id number]) Visit Report for 04/29/2019 Arrival Information Details Patient Name: Date of Service: [name], [name] 04/29/2019 2:30 PM Medical Record [id number] Patient Account Number: [id number] Date of Birth/Sex: Treating RN: December 21, 1955 (64 y.o. Billy Coast, Linda Primary Care Graylin Sperling: Chevis Pretty Other Clinician: Referring Maanasa Aderhold: Treating Janel Beane/Extender:Robson, [name], [facility] Weeks in Treatment: 13 Visit Information History Since Last Visit Added or deleted any medications: No Patient Arrived: Crutches Any new allergies or adverse reactions: No Arrival Time: 14:37 Had a fall or experienced change in No Accompanied By: self activities of daily living that may affect Transfer Assistance: None risk of falls: Patient Identification Verified: Yes Signs or symptoms of abuse/neglect since No Secondary Verification Process Completed: Yes last visito Patient Requires Transmission-Based No Hospitalized since last visit: No Precautions: Implantable device outside of the clinic No Patient Has Alerts: No excluding cellular tissue based products placed in the center since last visit: Has Dressing in Place as Prescribed: Yes Has Footwear/Offloading in Place as Yes Prescribed: Right: Total Contact Cast Pain Present Now: No Electronic Signature(s) Signed: 04/29/2019 5:39:59 PM By: Zenaida Deed RN, BSN Entered By: Zenaida Deed on 04/29/2019 14:41:52 -------------------------------------------------------------------------------- Encounter Discharge Information Details Patient Name: Date of Service: [name]. 04/29/2019 2:30 PM Medical Record [id number] Patient Account Number: [id number] Date of Birth/Sex: Treating RN: 12-Jul-1955 (63 y.o. [name] Primary Care Tegh Franek: Chevis Pretty Other Clinician: Referring Kaya Pottenger: Treating Latissa Frick/Extender:Robson, [name],  [facility] Weeks in Treatment: 43 Encounter Discharge Information Items Post Procedure Vitals Discharge Condition: Stable Temperature (F): 98.1 Ambulatory Status: Crutches Pulse (bpm): 74 Discharge Destination: Home Respiratory Rate (breaths/min): 18 Transportation: Private Auto Blood Pressure (mmHg): 110/50 Accompanied By: friend Schedule Follow-up Appointment: Yes Clinical Summary of Care: Patient Declined Electronic Signature(s) Signed: 04/29/2019 5:40:42 PM By: [name] Entered By: [name] on 04/29/2019 15:53:41 -------------------------------------------------------------------------------- Lower Extremity Assessment Details Patient Name: Date of Service: Sarah, [name] 04/29/2019 2:30 PM Medical Record [id number] Patient Account Number: [id number] Date of Birth/Sex: Treating RN: 06-09-1955 (63 y.o. [name] Primary Care Creola Krotz: Chevis Pretty Other Clinician: Referring Jymir Dunaj: Treating Zerick Prevette/Extender:Robson, [name], [facility] Weeks in Treatment: 13 Edema Assessment Assessed: [Left: No] [Right: No] Edema: [Left: Yes] [Right: Yes] Calf Left: Right: Point of Measurement: 43 cm From Medial Instep 38.5 cm 34.5 cm Ankle Left: Right: Point of Measurement: 12 cm From Medial Instep 25.3 cm 28.1 cm Vascular Assessment Pulses: Dorsalis Pedis Palpable: [Left:Yes] [Right:Yes] Electronic Signature(s) Signed: 04/29/2019 5:39:59 PM By: Zenaida Deed RN, BSN Entered By: Zenaida Deed on 04/29/2019 15:01:17 -------------------------------------------------------------------------------- Multi Wound Chart Details Patient Name: Date of Service: [name]. 04/29/2019 2:30 PM Medical Record [id number] Patient Account Number: [id number] Date of Birth/Sex: Treating RN: 22-Mar-1955 (63 y.o. 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] [9:Lower Extremity] Comorbid History: [10:Glaucoma, Chronic sinus Glaucoma, Chronic sinus N/A problems/congestion, Anemia, Sleep Apnea, Hypertension, Type II Diabetes, Rheumatoid Arthritis, Neuropathy] [9:problems/congestion, Anemia, Sleep Apnea, Hypertension, Type II  Diabetes, Rheumatoid Arthritis, Neuropathy] Date Acquired: [10:11/18/2018] [9:06/18/2018] [N/A:N/A] Weeks of Treatment: [10:13] [9:13] [N/A:N/A] Wound Status: [10:Open] [9:Open] [N/A:N/A] Measurements L x W x D 1.5x1.5x1.9 [9:3.8x3x0.5] [N/A:N/A] (cm)  Area (cm) : [10:1.767] [1:1.941] [N/A:N/A] Volume (cm) : [10:3.358] [9:4.477] [N/A:N/A] % Reduction in Area: [10:-0.50%] [9:18.60%] [N/A:N/A] % Reduction in Volume: -27.20% [9:54.80%] [N/A:N/A] Classification: [10:Grade 2] [9:Grade 2] [N/A:N/A] Exudate Amount: [10:Medium] [9:Large] [N/A:N/A] Exudate Type: [10:Serosanguineous] [9:Purulent] [N/A:N/A] Exudate Color: [10:red, brown] [9:yellow, brown, green] [N/A:N/A] Wound Margin: [10:Thickened] [9:Thickened] [N/A:N/A] Granulation Amount: [10:Large (67-100%)] [9:Large (67-100%)] [N/A:N/A] Granulation Quality: [10:Red, Pink] [9:Red, Pink, Hyper- granulation] [N/A:N/A] Necrotic Amount: [10:None Present (0%)] [9:None Present (0%)] [N/A:N/A] Exposed Structures: [10:Fat Layer (Subcutaneous Fat Layer (Subcutaneous N/A Tissue) Exposed: Yes Muscle: Yes Fascia: No Tendon: No Joint: No Bone: No] [9:Tissue) Exposed: Yes  Fascia: No Tendon: No Muscle: No Joint: No Bone: No] Epithelialization: [10:None] [9:Small (1-33%)] [N/A:N/A] Debridement: [10:Debridement - Excisional] [9:Debridement - Excisional] [N/A:N/A] Pre-procedure [10:15:36] [9:15:36] [N/A:N/A] Verification/Time Out Taken: Tissue Debrided: [10:Callus, Subcutaneous] [9:Callus, Subcutaneous] [N/A:N/A] Level: [10:Skin/Subcutaneous Tissue] [9:Skin/Subcutaneous Tissue] [N/A:N/A] Debridement Area (sq cm):2.25 [9:11.4] [N/A:N/A] Instrument: [10:Curette] [9:Curette] [N/A:N/A] Bleeding: [10:Moderate] [9:Moderate] [N/A:N/A] Hemostasis Achieved: [10:Pressure] [9:Pressure] [N/A:N/A] Procedural Pain: [10:0] [9:0] [N/A:N/A] Post Procedural Pain: [10:0] [9:0] [N/A:N/A] Debridement Treatment Procedure was tolerated [9:Procedure was tolerated] [N/A:N/A] Response: [10:well] [9:well] Post Debridement [10:1.5x1.5x1.9] [9:3.8x3x0.5] [N/A:N/A] Measurements L x W x D (cm) Post Debridement [10:3.358] [9:4.477] [N/A:N/A] Volume: (cm) Procedures Performed: Debridement [9:Debridement Total Contact Cast] [N/A:N/A] Treatment Notes Wound #10 (Left, Lateral, Plantar Foot) 1. Cleanse With Wound Cleanser 2. Periwound Care Skin Prep 3. Primary Dressing Applied Calcium Alginate Ag 4. Secondary Dressing Dry Gauze Roll Gauze 5. Secured With Tape Notes x2 felt. stockinette Wound #9 (Right, Medial Foot) 1. Cleanse With Wound Cleanser Soap and water 3. Primary Dressing Applied Calcium Alginate Ag 4. Secondary Dressing ABD Pad Roll Gauze Kerramax/Xtrasorb Drawtex 7. Footwear/Offloading device applied Total Contact Cast Notes TCC applied by MD. Electronic Signature(s) Signed: 04/29/2019 6:12:10 PM By: Levan Hurst RN, BSN Signed: 04/30/2019 5:51:00 PM By: Linton Ham MD Entered By: Linton Ham on 04/29/2019 16:32:17 -------------------------------------------------------------------------------- Multi-Disciplinary Care Plan Details Patient  Name: Date of Service: [facility] 04/29/2019 2:30 PM Medical Record [id number] Patient Account Number: [id number] Date of Birth/Sex: Treating RN: Mar 04, 1955 (63 y.o. [name] Primary Care Jasmen Emrich: Dewayne Hatch Other Clinician: Referring Amybeth Sieg: Treating Ceejay Kegley/Extender:Robson, [name], [facility] Weeks in Treatment: 30 Active Inactive Wound/Skin Impairment Nursing Diagnoses: Impaired tissue integrity Knowledge deficit related to ulceration/compromised skin integrity Goals: Patient/caregiver will verbalize understanding of skin care regimen Date Initiated: 01/28/2019 Target Resolution Date: 05/16/2019 Goal Status: Active Interventions: Assess patient/caregiver ability to obtain necessary supplies Assess patient/caregiver ability to perform ulcer/skin care regimen upon admission and as needed Assess ulceration(s) every visit Provide education on ulcer and skin care Notes: Electronic Signature(s) Signed: 04/29/2019 5:40:16 PM By: Deon Pilling Entered By: Deon Pilling on 04/29/2019 15:28:02 -------------------------------------------------------------------------------- Pain Assessment Details Patient Name: Date of Service: EURA, MCCAUSLIN 04/29/2019 2:30 PM Medical Record [id number] Patient Account Number: [id number] Date of Birth/Sex: Treating RN: [birth year] (64 y.o. Elam Dutch Primary Care Ocean Kearley: Dewayne Hatch Other Clinician: Referring Nichele Slawson: Treating Ariel Wingrove/Extender:Robson, [name], [facility] Weeks in Treatment: 13 Active Problems Location of Pain Severity and Description of Pain Patient Has Paino No Site Locations Rate the pain. Current Pain Level: 0 Pain Management and Medication Current Pain Management: Electronic Signature(s) Signed: 04/29/2019 5:39:59 PM By: Baruch Gouty RN, BSN Entered By: Baruch Gouty on 04/29/2019  14:44:40 -------------------------------------------------------------------------------- Patient/Caregiver Education Details Patient Name: [name] 4/12/2021andnbsp2:30 Date of Service: PM Medical Record [id number] Number: Patient Account Number: [id number] Date of Birth/Gender: 1955/01/26 (63 y.o. F) Treating RN: Deon Pilling Primary Care Other Clinician: Myrtie Hawk,  Physician: Shawna Orleans Treating Baltazar Najjar Physician/ExtenderMaryla Morrow, Referring Physician: Glorianne Manchester in Treatment: 13 Education Assessment Education Provided To: Patient Education Topics Provided Wound/Skin Impairment: Handouts: Skin Care Do's and Dont's Methods: Explain/Verbal Responses: Reinforcements needed Electronic Signature(s) Signed: 04/29/2019 5:40:16 PM By: Shawn Stall Entered By: Shawn Stall on 04/29/2019 15:28:14 -------------------------------------------------------------------------------- Wound Assessment Details Patient Name: Date of Service: ALAYIA, MEGGISON 04/29/2019 2:30 PM Medical Record [id number] Patient Account Number: [id number] Date of Birth/Sex: Treating RN: 1955-07-12 (64 y.o. [name] Primary Care Clif Serio: Chevis Pretty Other Clinician: Referring Ekaterina Denise: Treating Talia Hoheisel/Extender:Robson, [name], [facility] Weeks in Treatment: 13 Wound Status Wound Number: 10 Primary Diabetic Wound/Ulcer of the Lower Extremity Etiology: Wound Location: Left, Lateral, Plantar Foot Wound Open Wounding Event: Blister Status: Date Acquired: 11/18/2018 Comorbid Glaucoma, Chronic sinus Weeks Of Treatment: 13 History: problems/congestion, Anemia, Sleep Apnea, Clustered Wound: No Hypertension, Type II Diabetes, Rheumatoid Arthritis, Neuropathy Photos Photo Uploaded By: Benjaman Kindler on 04/30/2019 14:21:19 Wound Measurements Length: (cm) 1.5 Width: (cm) 1.5 Depth: (cm) 1.9 Area: (cm) 1.767 Volume: (cm) 3.358 Wound  Description Classification: Grade 2 Wound Margin: Thickened Exudate Amount: Medium Exudate Type: Serosanguineous Exudate Color: red, brown Wound Bed Granulation Amount: Large (67-100%) Granulation Quality: Red, Pink Necrotic Amount: None Present (0%) After Cleansing: No brino No Exposed Structure posed: No (Subcutaneous Tissue) Exposed: Yes osed: No osed: Yes sis of Muscle: No sed: No ed: No % Reduction in Area: -0.5% % Reduction in Volume: -27.2% Epithelialization: None Tunneling: No Undermining: No Foul Odor Slough/Fi Fascia Ex Fat Layer Tendon Exp Muscle Exp Necro Joint Expo Bone Expos Treatment Notes Wound #10 (Left, Lateral, Plantar Foot) 1. Cleanse With Wound Cleanser 2. Periwound Care Skin Prep 3. Primary Dressing Applied Calcium Alginate Ag 4. Secondary Dressing Dry Gauze Roll Gauze 5. Secured With Tape Notes x2 felt. stockinette Electronic Signature(s) Signed: 04/29/2019 5:39:59 PM By: Zenaida Deed RN, BSN Entered By: Zenaida Deed on 04/29/2019 15:04:57 -------------------------------------------------------------------------------- Wound Assessment Details Patient Name: Date of Service: [facility] 04/29/2019 2:30 PM Medical Record [id number] Patient Account Number: [id number] Date of Birth/Sex: Treating RN: 1955/05/26 (63 y.o. [name] Primary Care Gwen Sarvis: Chevis Pretty Other Clinician: Referring Demi Trieu: Treating Prisca Gearing/Extender:Robson, [name], [facility] Weeks in Treatment: 13 Wound Status Wound Number: 9 Primary Diabetic Wound/Ulcer of the Lower Extremity Etiology: Wound Location: Right, Medial Foot Wound Open Wounding Event: Blister Status: Date Acquired: 06/18/2018 Comorbid Glaucoma, Chronic sinus Weeks Of Treatment: 13 History: problems/congestion, Anemia, Sleep Apnea, Clustered Wound: No Hypertension, Type II Diabetes, Rheumatoid Arthritis, Neuropathy Photos Photo  Uploaded By: Benjaman Kindler on 04/30/2019 14:21:19 Wound Measurements Length: (cm) 3.8 % Reductio Width: (cm) 3 % Reductio Depth: (cm) 0.5 Epithelial Area: (cm) 8.954 Tunneling Volume: (cm) 4.477 Undermini Wound Description Classification: Grade 2 Wound Margin: Thickened Exudate Amount: Large Exudate Type: Purulent Exudate Color: yellow, brown, green Wound Bed Granulation Amount: Large (67-100%) Granulation Quality: Red, Pink, Hyper-granulation Necrotic Amount: None Present (0%) Foul Odor After Cleansing: No Slough/Fibrino No Exposed Structure Fascia Exposed: No Fat Layer (Subcutaneous Tissue) Exposed: Yes Tendon Exposed: No Muscle Exposed: No Joint Exposed: No Bone Exposed: No n in Area: 18.6% n in Volume: 54.8% ization: Small (1-33%) : No ng: No Treatment Notes Wound #9 (Right, Medial Foot) 1. Cleanse With Wound Cleanser Soap and water 3. Primary Dressing Applied Calcium Alginate Ag 4. Secondary Dressing ABD Pad Roll Gauze Kerramax/Xtrasorb Drawtex 7. Footwear/Offloading device applied Total Contact Cast Notes TCC applied by MD. Electronic Signature(s) Signed: 04/29/2019 5:39:59 PM By: Zenaida Deed RN, BSN Entered By:  Zenaida Deed on 04/29/2019 15:05:24 -------------------------------------------------------------------------------- Vitals Details Patient Name: Date of Service: MARY-ANN, PENNELLA 04/29/2019 2:30 PM Medical Record [id number] Patient Account Number: [id number] Date of Birth/Sex: Treating RN: 1955/09/12 (64 y.o. [name] Primary Care Zaydon Kinser: Chevis Pretty Other Clinician: Referring Badr Piedra: Treating Anber Mckiver/Extender:Robson, [name], [facility] Weeks in Treatment: 13 Vital Signs Time Taken: 14:41 Temperature (F): 98.1 Height (in): 68 Pulse (bpm): 74 Source: Stated Respiratory Rate (breaths/min): 18 Weight (lbs): 265 Blood Pressure (mmHg): 110/50 Source: Stated Reference Range: 80 -  120 mg / dl Body Mass Index (BMI): 40.3 Electronic Signature(s) Signed: 04/29/2019 5:39:59 PM By: Zenaida Deed RN, BSN Entered By: Zenaida Deed on 04/29/2019 14:43:02

## 2019-05-01 NOTE — Progress Notes (Signed)
MAHNOOR, MATHISEN ([id number]) Visit Report for 04/25/2019 Arrival Information Details Patient Name: Date of Service: [name], [name] 04/25/2019 9:15 AM Medical Record [id number] Patient Account Number: [id number] Date of Birth/Sex: Treating RN: 09-Sep-1955 (64 y.o. Helene Shoe, Meta.Reding Primary Care Jaquese Irving: Dewayne Hatch Other Clinician: Referring Airyana Sprunger: Treating Anessia Oakland/Extender:Robson, [name], [facility] Weeks in Treatment: 12 Visit Information History Since Last Visit Added or deleted any medications: No Patient Arrived: Crutches Any new allergies or adverse reactions: No Arrival Time: 09:41 Had a fall or experienced change in No Accompanied By: self activities of daily living that may affect Transfer Assistance: None risk of falls: Patient Identification Verified: Yes Signs or symptoms of abuse/neglect since last No Secondary Verification Process Completed: Yes visito Patient Requires Transmission-Based No Hospitalized since last visit: No Precautions: Implantable device outside of the clinic excluding No Patient Has Alerts: No cellular tissue based products placed in the center since last visit: Has Dressing in Place as Prescribed: Yes Pain Present Now: No Electronic Signature(s) Signed: 05/01/2019 9:19:04 AM By: Sandre Kitty Entered By: Sandre Kitty on 04/25/2019 09:42:29 -------------------------------------------------------------------------------- Encounter Discharge Information Details Patient Name: Date of Service: Sarah Libra. 04/25/2019 9:15 AM Medical Record [id number] Patient Account Number: [id number] Date of Birth/Sex: Treating RN: 12/24/1955 (64 y.o. Clearnce Sorrel Primary Care Epifanio Labrador: Dewayne Hatch Other Clinician: Referring Aniel Hubble: Treating Marcile Fuquay/Extender:Robson, [name], [facility] Weeks in Treatment: 12 Encounter Discharge Information Items Discharge Condition:  Stable Ambulatory Status: Crutches Discharge Destination: Home Transportation: Private Auto Accompanied By: self Schedule Follow-up Appointment: Yes Clinical Summary of Care: Patient Declined Electronic Signature(s) Signed: 04/25/2019 5:43:40 PM By: Kela Millin Entered By: Kela Millin on 04/25/2019 11:40:13 -------------------------------------------------------------------------------- Lower Extremity Assessment Details Patient Name: Date of Service: [name], [name] 04/25/2019 9:15 AM Medical Record [id number] Patient Account Number: [id number] Date of Birth/Sex: Treating RN: [birth year] (63 y.o. Clearnce Sorrel Primary Care Berea Majkowski: Dewayne Hatch Other Clinician: Referring Tahni Porchia: Treating Erastus Bartolomei/Extender:Robson, [name], [facility] Weeks in Treatment: 12 Edema Assessment Assessed: [Left: No] [Right: No] Edema: [Left: Yes] [Right: Yes] Calf Left: Right: Point of Measurement: 43 cm From Medial Instep 37 cm 41 cm Ankle Left: Right: Point of Measurement: 12 cm From Medial Instep 25.5 cm 27 cm Vascular Assessment Pulses: Dorsalis Pedis Palpable: [Left:Yes] [Right:Yes] Electronic Signature(s) Signed: 04/25/2019 5:43:40 PM By: Kela Millin Entered By: Kela Millin on 04/25/2019 09:46:53 -------------------------------------------------------------------------------- Multi-Disciplinary Care Plan Details Patient Name: Date of Service: Sarah Libra. 04/25/2019 9:15 AM Medical Record [id number] Patient Account Number: [id number] Date of Birth/Sex: Treating RN: 1955-02-18 (64 y.o. [name] Primary Care Abdulloh Ullom: Dewayne Hatch Other Clinician: Referring Daphyne Miguez: Treating Nishan Ovens/Extender:Robson, [name], [facility] Weeks in Treatment: 12 Active Inactive Wound/Skin Impairment Nursing Diagnoses: Impaired tissue integrity Knowledge deficit related to ulceration/compromised skin  integrity Goals: Patient/caregiver will verbalize understanding of skin care regimen Date Initiated: 01/28/2019 Target Resolution Date: 05/16/2019 Goal Status: Active Interventions: Assess patient/caregiver ability to obtain necessary supplies Assess patient/caregiver ability to perform ulcer/skin care regimen upon admission and as needed Assess ulceration(s) every visit Provide education on ulcer and skin care Notes: Electronic Signature(s) Signed: 04/25/2019 6:08:37 PM By: Deon Pilling Entered By: Deon Pilling on 04/25/2019 10:54:41 -------------------------------------------------------------------------------- Pain Assessment Details Patient Name: Date of Service: [name], [name] 04/25/2019 9:15 AM Medical Record [id number] Patient Account Number: [id number] Date of Birth/Sex: Treating RN: 11/27/1955 (64 y.o. [name] Primary Care Adaline Trejos: Dewayne Hatch Other Clinician: Referring Khara Renaud: Treating Gayl Ivanoff/Extender:Robson, [name], [facility] Weeks in Treatment: 12 Active Problems Location of Pain Severity and Description of Pain Patient  Has Paino No Site Locations Pain Management and Medication Current Pain Management: Electronic Signature(s) Signed: 04/25/2019 6:08:37 PM By: Shawn Stall Signed: 05/01/2019 9:19:04 AM By: Karl Ito Entered By: Karl Ito on 04/25/2019 09:42:58 -------------------------------------------------------------------------------- Patient/Caregiver Education Details Patient Name: [name] 4/8/2021andnbsp9:15 Date of Service: AM Medical Record [id number] Number: Patient Account Number: [id number] Date of Birth/Gender: March 20, 1955 (63 y.o. F) Treating RN: Shawn Stall Primary Care Other Clinician: Maryla Morrow, Physician: Humphrey Rolls Physician/ExtenderMaryla Morrow, Referring Physician: Glorianne Manchester in Treatment: 12 Education Assessment Education Provided  To: Patient Education Topics Provided Wound/Skin Impairment: Handouts: Skin Care Do's and Dont's Methods: Explain/Verbal Responses: Reinforcements needed Electronic Signature(s) Signed: 04/25/2019 6:08:37 PM By: Shawn Stall Entered By: Shawn Stall on 04/25/2019 10:55:09 -------------------------------------------------------------------------------- Wound Assessment Details Patient Name: Date of Service: [name], [name] 04/25/2019 9:15 AM Medical Record [id number] Patient Account Number: [id number] Date of Birth/Sex: Treating RN: 04/15/1955 (63 y.o. [name] Primary Care Marleigh [name]: Chevis Pretty Other Clinician: Referring Cartier Mapel: Treating Tavie Haseman/Extender:Robson, [name], [facility] Weeks in Treatment: 12 Wound Status Wound Number: 10 Primary Diabetic Wound/Ulcer of the Lower Extremity Etiology: Wound Location: Left, Lateral, Plantar Foot Wound Open Wounding Event: Blister Status: Date Acquired: 11/18/2018 Comorbid Glaucoma, Chronic sinus Weeks Of Treatment: 12 History: problems/congestion, Anemia, Sleep Apnea, Clustered Wound: No Hypertension, Type II Diabetes, Rheumatoid Arthritis, Neuropathy Wound Measurements Length: (cm) 1.6 % Reduction Width: (cm) 1.6 % Reduction Depth: (cm) 2.3 Epitheliali Area: (cm) 2.011 Tunneling: Volume: (cm) 4.624 Underminin Wound Description Classification: Grade 2 Wound Margin: Thickened Exudate Amount: Medium Exudate Type: Serosanguineous Exudate Color: red, brown Wound Bed Granulation Amount: Large (67-100%) Granulation Quality: Red, Pink Necrotic Amount: None Present (0%) Foul Odor After Cleansing: No Slough/Fibrino No Exposed Structure Fascia Exposed: No Fat Layer (Subcutaneous Tissue) Exposed: Yes Tendon Exposed: No Muscle Exposed: Yes Necrosis of Muscle: No Joint Exposed: No Bone Exposed: Yes in Area: -14.3% in Volume: -75.2% zation: None No g: No Electronic  Signature(s) Signed: 04/25/2019 5:43:40 PM By: [name] Entered By: [name] on 04/25/2019 09:47:24 -------------------------------------------------------------------------------- Wound Assessment Details Patient Name: Date of Service: [name]. 04/25/2019 9:15 AM Medical Record [id number] Patient Account Number: [id number] Date of Birth/Sex: Treating RN: 04/09/1955 (63 y.o. [name] Primary Care Marsel Gail: Chevis Pretty Other Clinician: Referring Jatavia Keltner: Treating Wake Conlee/Extender:Robson, [name], [facility] Weeks in Treatment: 12 Wound Status Wound Number: 9 Primary Diabetic Wound/Ulcer of the Lower Extremity Etiology: Wound Location: Right, Medial Foot Wound Open Wounding Event: Blister Status: Date Acquired: 06/18/2018 Comorbid Glaucoma, Chronic sinus Weeks Of Treatment: 12 History: problems/congestion, Anemia, Sleep Apnea, Clustered Wound: No Hypertension, Type II Diabetes, Rheumatoid Arthritis, Neuropathy Wound Measurements Length: (cm) 3.1 % Reduction Width: (cm) 1.7 % Reduction Depth: (cm) 0.2 Epitheliali Area: (cm) 4.139 Tunneling: Volume: (cm) 0.828 Underminin Wound Description Classification: Grade 2 Foul Odor Wound Margin: Thickened Slough/Fib Exudate Amount: Large Exudate Type: Serosanguineous Exudate Color: red, brown Wound Bed Granulation Amount: Large (67-100%) Granulation Quality: Red, Pink, Hyper-granulation Fascia Expo Necrotic Amount: None Present (0%) Fat Layer ( Tendon Expo Muscle Expo Joint Expos Bone Expose After Cleansing: No rino No Exposed Structure sed: No Subcutaneous Tissue) Exposed: Yes sed: No sed: No ed: No d: No in Area: 62.4% in Volume: 91.6% zation: Small (1-33%) No g: No Electronic Signature(s) Signed: 04/25/2019 5:43:40 PM By: [name] Entered By: [name] on 04/25/2019  09:47:33 -------------------------------------------------------------------------------- Vitals Details Patient Name: Date of Service: [name]. 04/25/2019 9:15 AM Medical Record [id number] Patient Account Number: [id number] Date of Birth/Sex: Treating RN: 08-Dec-1955 (63  y.o. [name] Primary Care Deniah Saia: Chevis Pretty Other Clinician: Referring Stacey Sago: Treating Clyde Upshaw/Extender:Robson, [name], [facility] Weeks in Treatment: 12 Vital Signs Time Taken: 09:42 Temperature (F): 98.2 Height (in): 68 Pulse (bpm): 79 Weight (lbs): 265 Respiratory Rate (breaths/min): 18 Body Mass Index (BMI): 40.3 Blood Pressure (mmHg): 142/64 Reference Range: 80 - 120 mg / dl Electronic Signature(s) Signed: 05/01/2019 9:19:04 AM By: Karl Ito Entered By: Karl Ito on 04/25/2019 09:42:51

## 2019-05-01 NOTE — Progress Notes (Signed)
KARMAH, POTOCKI ([id number]) Visit Report for 03/18/2019 Arrival Information Details Patient Name: Date of Service: [name], [name] 03/18/2019 3:15 PM Medical Record [id number] Patient Account Number: [id number] Date of Birth/Sex: Treating RN: Jun 10, 1955 (64 y.o. [name] Primary Care Saverio Kader: Chevis Pretty Other Clinician: Referring Jadzia Ibsen: Treating Aireal Slater/Extender:Robson, [name], [facility] Weeks in Treatment: 7 Visit Information History Since Last Visit Added or deleted any medications: No Patient Arrived: Crutches Any new allergies or adverse reactions: No Arrival Time: 16:01 Had a fall or experienced change in No Accompanied By: family activities of daily living that may affect member risk of falls: Transfer Assistance: None Signs or symptoms of abuse/neglect since last No Patient Identification Verified: Yes visito Secondary Verification Process Yes Hospitalized since last visit: No Completed: Implantable device outside of the clinic excluding No Patient Requires Transmission-Based No cellular tissue based products placed in the center Precautions: since last visit: Patient Has Alerts: No Has Dressing in Place as Prescribed: Yes Pain Present Now: No Electronic Signature(s) Signed: 05/01/2019 9:21:08 AM By: Karl Ito Entered By: Karl Ito on 03/18/2019 16:02:07 -------------------------------------------------------------------------------- Encounter Discharge Information Details Patient Name: Date of Service: [name]. 03/18/2019 3:15 PM Medical Record [id number] Patient Account Number: [id number] Date of Birth/Sex: Treating RN: 07-Jul-1955 (64 y.o. [name] Primary Care Mavrick Mcquigg: Chevis Pretty Other Clinician: Referring Akaya Proffit: Treating Andreas Sobolewski/Extender:Robson, [name], [facility] Weeks in Treatment: 7 Encounter Discharge Information Items Discharge Condition:  Stable Ambulatory Status: Crutches Discharge Destination: Home Transportation: Private Auto Accompanied By: self Schedule Follow-up Appointment: Yes Clinical Summary of Care: Patient Declined Electronic Signature(s) Signed: 03/19/2019 9:14:02 AM By: Yevonne Pax RN Entered By: Yevonne Pax on 03/18/2019 17:02:19 -------------------------------------------------------------------------------- Lower Extremity Assessment Details Patient Name: Date of Service: [name] 03/18/2019 3:15 PM Medical Record [id number] Patient Account Number: [id number] Date of Birth/Sex: Treating RN: 1955/03/03 (63 y.o. [name] Primary Care Andreu Drudge: Chevis Pretty Other Clinician: Referring Adriona Kaney: Treating Ashland Wiseman/Extender:Robson, [name], [facility] Weeks in Treatment: 7 Edema Assessment Assessed: [Left: Yes] [Right: Yes] Edema: [Left: Yes] [Right: Yes] Calf Left: Right: Point of Measurement: 43 cm From Medial Instep 36 cm 36 cm Ankle Left: Right: Point of Measurement: 12 cm From Medial Instep 26 cm 29.5 cm Vascular Assessment Pulses: Dorsalis Pedis Palpable: [Left:Yes] [Right:Yes] Electronic Signature(s) Signed: 03/18/2019 5:15:23 PM By: Shawn Stall Entered By: Shawn Stall on 03/18/2019 16:10:57 -------------------------------------------------------------------------------- Multi Wound Chart Details Patient Name: Date of Service: [name]. 03/18/2019 3:15 PM Medical Record [id number] Patient Account Number: [id number] Date of Birth/Sex: Treating RN: September 30, 1955 (64 y.o. [name] Primary Care Reginna Sermeno: Chevis Pretty Other Clinician: Referring Delwyn Scoggin: Treating Seamus Warehime/Extender:Robson, [name], [facility] Weeks in Treatment: 7 Vital Signs Height(in): 68 Pulse(bpm): 74 Weight(lbs): 265 Blood Pressure(mmHg): 178/82 Body Mass Index(BMI): 40 Temperature(F):  98.1 Respiratory 18 Rate(breaths/min): Photos: [10:No Photos] [9:No Photos] [N/A:N/A] Wound Location: [10:Left, Lateral, Plantar Foot Right, Medial Foot] [N/A:N/A] Wounding Event: [10:Blister] [9:Blister] [N/A:N/A] Primary Etiology: [10:Diabetic Wound/Ulcer of the Diabetic Wound/Ulcer of the N/A Lower Extremity] [9:Lower Extremity] Comorbid History: [10:Glaucoma, Chronic sinus Glaucoma, Chronic sinus N/A problems/congestion, Anemia, Sleep Apnea, Hypertension, Type II Diabetes, Rheumatoid Arthritis, Neuropathy] [9:problems/congestion, Anemia, Sleep Apnea, Hypertension, Type II  Diabetes, Rheumatoid Arthritis, Neuropathy] Date Acquired: [10:11/18/2018] [9:06/18/2018] [N/A:N/A] Weeks of Treatment: [10:7] [9:7] [N/A:N/A] Wound Status: [10:Open] [9:Open] [N/A:N/A] Measurements L x W x D 1.4x1.6x1.7 [9:3.9x2.9x0.2] [N/A:N/A] (cm) Area (cm) : [10:1.759] [9:8.883] [N/A:N/A] Volume (cm) : [10:2.991] [9:1.777] [N/A:N/A] % Reduction in Area: [10:0.00%] [9:19.20%] [N/A:N/A] % Reduction in Volume: -13.30% [9:82.00%] [N/A:N/A] Classification: [10:Grade 2] [9:Grade  2] [N/A:N/A] Exudate Amount: [10:Medium] [9:Large] [N/A:N/A] Exudate Type: [10:Serosanguineous] [9:Serosanguineous] [N/A:N/A] Exudate Color: [10:red, brown] [9:red, brown] [N/A:N/A] Wound Margin: [10:Thickened] [9:Thickened] [N/A:N/A] Granulation Amount: [10:Large (67-100%)] [9:Large (67-100%)] [N/A:N/A] Granulation Quality: [10:Red] [9:Red, Pink] [N/A:N/A] Necrotic Amount: [10:Small (1-33%)] [9:Small (1-33%)] [N/A:N/A] Exposed Structures: [10:Fat Layer (Subcutaneous Fat Layer (Subcutaneous N/A Tissue) Exposed: Yes Muscle: Yes Bone: Yes Fascia: No Tendon: No Joint: No] [9:Tissue) Exposed: Yes Fascia: No Tendon: No Muscle: No Joint: No Bone: No] Epithelialization: [10:None] [9:Small (1-33%)] [N/A:N/A] Assessment Notes: [10:N/A] [9:callus periwound. Total Contact Cast] [N/A:N/A N/A] Treatment Notes Wound #10 (Left, Lateral, Plantar Foot) 1.  Cleanse With Wound Cleanser Soap and water 3. Primary Dressing Applied Calcium Alginate Ag Foam 4. Secondary Dressing Roll Gauze 5. Secured With Tape Notes foam donut as secondary. stockinette Wound #9 (Right, Medial Foot) 1. Cleanse With Wound Cleanser Soap and water 3. Primary Dressing Applied Calcium Alginate Ag 4. Secondary Dressing ABD Pad Dry Gauze 7. Footwear/Offloading device applied Total Contact Cast Electronic Signature(s) Signed: 03/18/2019 5:45:55 PM By: Linton Ham MD Signed: 03/18/2019 5:57:14 PM By: Levan Hurst RN, BSN Entered By: Linton Ham on 03/18/2019 17:24:54 -------------------------------------------------------------------------------- Multi-Disciplinary Care Plan Details Patient Name: Date of Service: [facility] 03/18/2019 3:15 PM Medical Record [id number] Patient Account Number: [id number] Date of Birth/Sex: Treating RN: [birth year] (63 y.o. [name] Primary Care Maxson Oddo: Dewayne Hatch Other Clinician: Referring Obe Ahlers: Treating Juandaniel Manfredo/Extender:Robson, [name], [facility] Weeks in Treatment: 7 Active Inactive Wound/Skin Impairment Nursing Diagnoses: Impaired tissue integrity Knowledge deficit related to ulceration/compromised skin integrity Goals: Patient/caregiver will verbalize understanding of skin care regimen Date Initiated: 01/28/2019 Target Resolution Date: 03/29/2019 Goal Status: Active Interventions: Assess patient/caregiver ability to obtain necessary supplies Assess patient/caregiver ability to perform ulcer/skin care regimen upon admission and as needed Assess ulceration(s) every visit Provide education on ulcer and skin care Notes: Electronic Signature(s) Signed: 03/18/2019 5:57:14 PM By: Levan Hurst RN, BSN Entered By: Levan Hurst on 03/18/2019 17:54:06 -------------------------------------------------------------------------------- Pain Assessment Details Patient  Name: Date of Service: [name] 03/18/2019 3:15 PM Medical Record [id number] Patient Account Number: [id number] Date of Birth/Sex: Treating RN: 1955-08-23 (64 y.o. [name] Primary Care Clyda Smyth: Dewayne Hatch Other Clinician: Referring Naia Ruff: Treating Marsh Heckler/Extender:Robson, [name], [facility] Weeks in Treatment: 7 Active Problems Location of Pain Severity and Description of Pain Patient Has Paino No Site Locations Pain Management and Medication Current Pain Management: Electronic Signature(s) Signed: 03/18/2019 5:57:14 PM By: Levan Hurst RN, BSN Signed: 05/01/2019 9:21:08 AM By: Sandre Kitty Entered By: Sandre Kitty on 03/18/2019 16:03:17 -------------------------------------------------------------------------------- Patient/Caregiver Education Details Patient Name: [name] 3/1/2021andnbsp3:15 Date of Service: PM Medical Record [id number] Number: Patient Account Number: [id number] Date of Birth/Gender: 1955-08-13 (63 y.o. F) Treating RN: Levan Hurst Other Clinician: Primary Care Physician: Dewayne Hatch Treating Linton Ham Physician/ExtenderMyrtie Hawk, Referring Physician: Tenna Delaine in Treatment: 7 Education Assessment Education Provided To: Patient Education Topics Provided Wound/Skin Impairment: Methods: Explain/Verbal Responses: State content correctly Electronic Signature(s) Signed: 03/18/2019 5:57:14 PM By: Levan Hurst RN, BSN Entered By: Levan Hurst on 03/18/2019 17:54:15 -------------------------------------------------------------------------------- Wound Assessment Details Patient Name: Date of Service: [name] 03/18/2019 3:15 PM Medical Record [id number] Patient Account Number: [id number] Date of Birth/Sex: Treating RN: 09-Apr-1955 (63 y.o. [name] Primary Care Bruce Churilla: Dewayne Hatch Other Clinician: Referring Bebe Moncure:  Treating Vanissa Strength/Extender:Robson, [name], [facility] Weeks in Treatment: 7 Wound Status Wound Number: 10 Primary Diabetic Wound/Ulcer of the Lower Extremity Etiology: Wound Location: Left Foot - Plantar, Lateral Wound Open Wounding Event: Blister Status: Date Acquired: 11/18/2018  Comorbid Glaucoma, Chronic sinus Weeks Of Treatment: 7 History: problems/congestion, Anemia, Sleep Apnea, Clustered Wound: No Hypertension, Type II Diabetes, Rheumatoid Arthritis, Neuropathy Photos Wound Measurements Length: (cm) 1.4 Width: (cm) 1.6 Depth: (cm) 1.7 Area: (cm) 1.759 Volume: (cm) 2.991 Wound Description Classification: Grade 2 Wound Margin: Thickened Exudate Amount: Medium Exudate Type: Serosanguineous Exudate Color: red, brown Wound Bed Granulation Amount: Large (67-100%) Granulation Quality: Red Necrotic Amount: Small (1-33%) Necrotic Quality: Adherent Slough or After Cleansing: No Fibrino Yes Exposed Structure xposed: No r (Subcutaneous Tissue) Exposed: Yes xposed: No xposed: Yes rosis of Muscle: No posed: No osed: Yes % Reduction in Area: 0% % Reduction in Volume: -13.3% Epithelialization: None Tunneling: No Undermining: No Foul Od Slough/ Fascia E Fat Laye Tendon E Muscle E Nec Joint Ex Bone Exp Electronic Signature(s) Signed: 03/28/2019 4:27:59 PM By: Benjaman Kindler EMT/HBOT Signed: 04/04/2019 4:55:35 PM By: Shawn Stall Previous Signature: 03/18/2019 5:15:23 PM Version By: Shawn Stall Entered By: Benjaman Kindler on 03/28/2019 13:54:54 -------------------------------------------------------------------------------- Wound Assessment Details Patient Name: Date of Service: [name]. 03/18/2019 3:15 PM Medical Record [id number] Patient Account Number: [id number] Date of Birth/Sex: Treating RN: 12-Jan-1956 (64 y.o. Debara Pickett, Yvonne Kendall Primary Care Leandre Wien: Chevis Pretty Other Clinician: Referring Dawayne Ohair: Treating  Theophil Thivierge/Extender:Robson, [name], [facility] Weeks in Treatment: 7 Wound Status Wound Number: 9 Primary Diabetic Wound/Ulcer of the Lower Extremity Etiology: Wound Location: Right Foot - Medial Wound Open Wounding Event: Blister Status: Date Acquired: 06/18/2018 Comorbid Glaucoma, Chronic sinus Weeks Of Treatment: 7 History: problems/congestion, Anemia, Sleep Apnea, Clustered Wound: No Hypertension, Type II Diabetes, Rheumatoid Arthritis, Neuropathy Photos Wound Measurements Length: (cm) 3.9 % Reducti Width: (cm) 2.9 % Reducti Depth: (cm) 0.2 Epithelia Area: (cm) 8.883 Tunnelin Volume: (cm) 1.777 Undermin Wound Description Classification: Grade 2 Wound Margin: Thickened Exudate Amount: Large Exudate Type: Serosanguineous Exudate Color: red, brown Wound Bed Granulation Amount: Large (67-100%) Granulation Quality: Red, Pink Necrotic Amount: Small (1-33%) Necrotic Quality: Adherent Slough Foul Odor After Cleansing: No Slough/Fibrino Yes Exposed Structure Fascia Exposed: No Fat Layer (Subcutaneous Tissue) Exposed: Yes Tendon Exposed: No Muscle Exposed: No Joint Exposed: No Bone Exposed: No on in Area: 19.2% on in Volume: 82% lization: Small (1-33%) g: No ing: No Assessment Notes callus periwound. Electronic Signature(s) Signed: 03/28/2019 4:27:59 PM By: Benjaman Kindler EMT/HBOT Signed: 04/04/2019 4:55:35 PM By: Shawn Stall Previous Signature: 03/18/2019 5:15:23 PM Version By: Shawn Stall Entered By: Benjaman Kindler on 03/28/2019 13:55:21 -------------------------------------------------------------------------------- Vitals Details Patient Name: Date of Service: [name]. 03/18/2019 3:15 PM Medical Record [id number] Patient Account Number: [id number] Date of Birth/Sex: Treating RN: October 15, 1955 (63 y.o. [name] Primary Care Emmalynn Pinkham: Chevis Pretty Other Clinician: Referring Ramaya Guile: Treating Imanuel Pruiett/Extender:Robson,  [name], [facility] Weeks in Treatment: 7 Vital Signs Time Taken: 16:02 Temperature (F): 98.1 Height (in): 68 Pulse (bpm): 74 Weight (lbs): 265 Respiratory Rate (breaths/min): 18 Body Mass Index (BMI): 40.3 Blood Pressure (mmHg): 178/82 Reference Range: 80 - 120 mg / dl Electronic Signature(s) Signed: 05/01/2019 9:21:08 AM By: Karl Ito Entered By: Karl Ito on 03/18/2019 16:03:05

## 2019-05-01 NOTE — Progress Notes (Signed)
[name], [name] ([id number]) Visit Report for 04/08/2019 Arrival Information Details Patient Name: Date of Service: [name], [name] 04/08/2019 12:30 PM Medical Record [id number] Patient Account Number: [id number] Date of Birth/Sex: Treating RN: 03/09/1955 (64 y.o. [name] Primary Care [name]: [name] Other Clinician: Referring [name]: Treating [name]/Extender:[name], [facility] Weeks in Treatment: 10 Visit Information History Since Last Visit All ordered tests and consults were completed: No Patient Arrived: Crutches Added or deleted any medications: No Arrival Time: 12:59 Any new allergies or adverse reactions: No Accompanied By: self Had a fall or experienced change in No Transfer Assistance: None activities of daily living that may affect Patient Identification Verified: Yes risk of falls: Secondary Verification Process Completed: Yes Signs or symptoms of abuse/neglect since last No Patient Requires Transmission-Based No visito Precautions: Hospitalized since last visit: No Patient Has Alerts: No Implantable device outside of the clinic excluding No cellular tissue based products placed in the center since last visit: Has Dressing in Place as Prescribed: Yes Pain Present Now: No Electronic Signature(s) Signed: 05/01/2019 9:21:08 AM By: [name] Entered By: [name] on 04/08/2019 12:59:24 -------------------------------------------------------------------------------- Encounter Discharge Information Details Patient Name: Date of Service: [name]. 04/08/2019 12:30 PM Medical Record [id number] Patient Account Number: [id number] Date of Birth/Sex: Treating RN: 1955-02-09 (64 y.o. [name] Primary Care [name]: [name] Other Clinician: Referring [name]: Treating [name]/Extender:[name], [facility] Weeks in Treatment: 10 Encounter  Discharge Information Items Post Procedure Vitals Discharge Condition: Stable Temperature (F): 98.2 Ambulatory Status: Crutches Pulse (bpm): 75 Discharge Destination: Home Respiratory Rate (breaths/min): 18 Transportation: Private Auto Blood Pressure (mmHg): 158/72 Accompanied By: self Schedule Follow-up Appointment: Yes Clinical Summary of Care: Electronic Signature(s) Signed: 04/08/2019 5:27:21 PM By: [name] Entered By: [name] on 04/08/2019 14:08:25 -------------------------------------------------------------------------------- Lower Extremity Assessment Details Patient Name: Date of Service: [name] 04/08/2019 12:30 PM Medical Record [id number] Patient Account Number: [id number] Date of Birth/Sex: Treating RN: 06-Dec-1955 (63 y.o. [name] Primary Care Ivor Kishi: [name] Other Clinician: Referring Shalese Strahan: Treating [name]/Extender:[name], [facility] Weeks in Treatment: 10 Edema Assessment Assessed: [Left: Yes] [Right: Yes] Edema: [Left: Yes] [Right: Yes] Calf Left: Right: Point of Measurement: 43 cm From Medial Instep 39 cm 33 cm Ankle Left: Right: Point of Measurement: 12 cm From Medial Instep 26 cm 28 cm Electronic Signature(s) Signed: 04/08/2019 5:27:21 PM By: [name] Entered By: [name] on 04/08/2019 13:06:39 -------------------------------------------------------------------------------- Multi Wound Chart Details Patient Name: Date of Service: [name]. 04/08/2019 12:30 PM Medical Record [id number] Patient Account Number: [id number] Date of Birth/Sex: Treating RN: 1955/07/19 (64 y.o. 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] [9:Lower Extremity] Comorbid History: [10:Glaucoma, Chronic sinus Glaucoma, Chronic sinus N/A problems/congestion, Anemia, Sleep Apnea, Hypertension, Type II Diabetes, Rheumatoid Arthritis, Neuropathy] [9:problems/congestion, Anemia, Sleep Apnea, Hypertension, Type II  Diabetes, Rheumatoid Arthritis, Neuropathy] Date Acquired: [10:11/18/2018] [9:06/18/2018] [N/A:N/A] Weeks of Treatment: [10:10] [9:10] [N/A:N/A] Wound Status: [10:Open] [9:Open] [N/A:N/A] Measurements L x W x D 1.5x1.6x1.4 [9:3.7x3x0.3] [N/A:N/A] (cm) Area (cm) : [10:1.885] [9:8.718] [N/A:N/A] Volume (cm) : [10:2.639] [9:2.615] [N/A:N/A] % Reduction in  Area: [10:-7.20%] [9:20.70%] [N/A:N/A] % Reduction in Volume: 0.00% [9:73.60%] [N/A:N/A] Starting Position 1 [9:12] (o'clock): Ending Position 1 [9:4] (o'clock): Maximum Distance 1 [9:0.4] (cm): Undermining: [10:No] [9:Yes] [N/A:N/A] Classification: [10:Grade 2] [9:Grade 2] [N/A:N/A] Exudate Amount: [10:Medium] [9:Large] [N/A:N/A] Exudate Type: [10:Serosanguineous] [9:Purulent] [N/A:N/A] Exudate Color: [10:red, brown] [9:yellow, brown, green] [N/A:N/A] Wound Margin: [10:Thickened] [9:Thickened] [N/A:N/A] Granulation Amount: [10:Large (67-100%)] [9:Large (67-100%)] [N/A:N/A] Granulation Quality: [10:Red, Pink] [9:Red, Pink, Hyper- granulation] [N/A:N/A] Necrotic Amount: [10:None Present (0%)] [9:None Present (0%)] [N/A:N/A] Exposed Structures: [10:Fat Layer (Subcutaneous Fat Layer (Subcutaneous N/A Tissue) Exposed: Yes Muscle: Yes Bone: Yes Fascia: No Tendon: No Joint: No] [9:Tissue) Exposed: Yes Fascia: No Tendon: No  Muscle: No Joint: No Bone: No] Epithelialization: [10:Small (1-33%)] [9:Small (1-33%)] [N/A:N/A] Debridement: [10:N/A] [9:Debridement - Excisional N/A] Pre-procedure [10:N/A] [9:13:35] [N/A:N/A] Verification/Time Out Taken: Tissue Debrided: [10:N/A] [9:Callus, Subcutaneous] [N/A:N/A] Level: [10:N/A] [9:Skin/Subcutaneous Tissue N/A] Debridement Area (sq cm):N/A [9:11.1] [N/A:N/A] Instrument: [10:N/A] [9:Curette] [N/A:N/A] Bleeding: [10:N/A] [9:Moderate] [N/A:N/A] Hemostasis Achieved: [10:N/A] [9:Silver Nitrate] [N/A:N/A] Procedural Pain: [10:N/A] [9:0] [N/A:N/A] Post Procedural Pain: [10:N/A] [9:0] [N/A:N/A] Debridement Treatment [10:N/A] [9:Procedure was tolerated] [N/A:N/A] Response: [9:well] Post Debridement [10:N/A] [9:3.7x3x0.3] [N/A:N/A] Measurements L x W x D (cm) Post Debridement [10:N/A] [9:2.615] [N/A:N/A] Volume: (cm) Assessment Notes: [10:callus to periwound.] [9:N/A] [N/A:N/A] Procedures Performed: [10:N/A] [9:Debridement Total Contact Cast] [N/A:N/A] Treatment Notes Electronic Signature(s) Signed: 04/08/2019 5:18:21 PM By: Levan Hurst RN, BSN Signed: 04/08/2019 5:34:39 PM By: Linton Ham MD Entered By: Linton Ham on 04/08/2019 13:58:45 -------------------------------------------------------------------------------- Multi-Disciplinary Care Plan Details Patient Name: Date of Service: Sarah Libra. 04/08/2019 12:30 PM Medical Record [id number] Patient Account Number: [id number] Date of Birth/Sex: Treating RN: [birth year] (64 y.o. [name] Primary Care Schuyler Behan: Dewayne Hatch Other Clinician: Referring Lucy Boardman: Treating Jemiah Ellenburg/Extender:Robson, [name], [facility] Weeks in Treatment: 10 Active Inactive Wound/Skin Impairment Nursing Diagnoses: Impaired tissue integrity Knowledge deficit related to ulceration/compromised skin integrity Goals: Patient/caregiver will verbalize understanding of skin care regimen Date  Initiated: 01/28/2019 Target Resolution Date: 05/16/2019 Goal Status: Active Interventions: Assess patient/caregiver ability to obtain necessary supplies Assess patient/caregiver ability to perform ulcer/skin care regimen upon admission and as needed Assess ulceration(s) every visit Provide education on ulcer and skin care Notes: Electronic Signature(s) Signed: 04/08/2019 5:18:21 PM By: Levan Hurst RN, BSN Entered By: Levan Hurst on 04/08/2019 13:02:22 -------------------------------------------------------------------------------- Pain Assessment Details Patient Name: Date of Service: Sarah Libra. 04/08/2019 12:30 PM Medical Record [id number] Patient Account Number: [id number] Date of Birth/Sex: Treating RN: 05/26/1955 (64 y.o. [name] Primary Care Jedadiah Abdallah: Dewayne Hatch Other Clinician: Referring Shalamar Plourde: Treating Keeli Roberg/Extender:Robson, [name], [facility] Weeks in Treatment: 10 Active Problems Location of Pain Severity and Description of Pain Patient Has Paino No Site Locations Pain Management and Medication Current Pain Management: Electronic Signature(s) Signed: 04/08/2019 5:18:21 PM By: Levan Hurst RN, BSN Signed: 05/01/2019 9:21:08 AM By: Sandre Kitty Entered By: Sandre Kitty on 04/08/2019 13:00:01 -------------------------------------------------------------------------------- Patient/Caregiver Education Details Patient Name: Sarah Libra 3/22/2021andnbsp12:30 Patient Name: Sarah Libra 3/22/2021andnbsp12:30 Date of Service: PM Medical Record [id number] Number: Patient Account Number: [id number] Date of Birth/Gender: Treating RN: 12/10/1955 (63 y.o. [name] Primary Care Other Clinician: Myrtie Hawk, Physician: Lidia Collum Physician/ExtenderMyrtie Hawk, Referring Physician: Tenna Delaine in Treatment: 10 Education Assessment Education Provided  To: Patient Education Topics Provided Wound/Skin Impairment: Methods: Explain/Verbal Responses: State content correctly Electronic Signature(s) Signed: 04/08/2019 5:18:21 PM By: Levan Hurst RN, BSN Entered By: Levan Hurst on 04/08/2019 13:02:34 -------------------------------------------------------------------------------- Wound Assessment Details Patient Name: Date of Service: Sarah Libra. 04/08/2019 12:30 PM Medical  Record [id number] Patient Account Number: [id number] Date of Birth/Sex: Treating RN: 03-May-1955 (64 y.o. [name] Primary Care Wilson Sample: [name] Other Clinician: Referring Cavan Bearden: Treating Sohil Timko/Extender:[name], [facility] Weeks in Treatment: 10 Wound Status Wound Number: 10 Primary Diabetic Wound/Ulcer of the Lower Extremity Etiology: Wound Location: Left Foot - Plantar, Lateral Wound Open Wounding Event: Blister Status: Date Acquired: 11/18/2018 Comorbid Glaucoma, Chronic sinus Weeks Of Treatment: 10 History: problems/congestion, Anemia, Sleep Apnea, Clustered Wound: No Hypertension, Type II Diabetes, Rheumatoid Arthritis, Neuropathy Photos Photo Uploaded By: Benjaman Kindler on 04/09/2019 14:27:16 Wound Measurements Length: (cm) 1.5 % Reduction Width: (cm) 1.6 % Reduction Depth: (cm) 1.4 Epithelializ Area: (cm) 1.885 Tunneling: Volume: (cm) 2.639 Undermining Wound Description Classification: Grade 2 Wound Margin: Thickened Exudate Amount: Medium Exudate Type: Serosanguineous Exudate Color: red, brown Wound Bed Granulation Amount: Large (67-100%) Granulation Quality: Red, Pink Necrotic Amount: None Present (0%) Foul Odor After Cleansing: No Slough/Fibrino No Exposed Structure Fascia Exposed: No Fat Layer (Subcutaneous Tissue) Exposed: Yes Tendon Exposed: No Muscle Exposed: Yes Necrosis of Muscle: No Joint Exposed: No Bone Exposed: Yes in Area: -7.2% in Volume: 0% ation:  Small (1-33%) No : No Assessment Notes callus to periwound. Electronic Signature(s) Signed: 04/08/2019 5:18:21 PM By: Zandra Abts RN, BSN Entered By: Zandra Abts on 04/08/2019 13:40:24 -------------------------------------------------------------------------------- Wound Assessment Details Patient Name: Date of Service: [name], [name] 04/08/2019 12:30 PM Medical Record [id number] Patient Account Number: [id number] Date of Birth/Sex: Treating RN: 1955-02-16 (63 y.o. Debara Pickett, Millard[facility] Primary Care Sharine Cadle: [name] Other Clinician: Referring Mahamud Metts: Treating Rya Rausch/Extender:[name], [facility] Weeks in Treatment: 10 Wound Status Wound Number: 9 Primary Diabetic Wound/Ulcer of the Lower Extremity Etiology: Wound Location: Right Foot - Medial Wound Open Wounding Event: Blister Status: Date Acquired: 06/18/2018 Comorbid Glaucoma, Chronic sinus Weeks Of Treatment: 10 History: problems/congestion, Anemia, Sleep Apnea, Clustered Wound: No Hypertension, Type II Diabetes, Rheumatoid Arthritis, Neuropathy Photos Photo Uploaded By: Benjaman Kindler on 04/09/2019 14:27:17 Wound Measurements Length: (cm) 3.7 % Reduction Width: (cm) 3 % Reduction Depth: (cm) 0.3 Epitheliali Area: (cm) 8.718 Tunneling: Volume: (cm) 2.615 Underminin Starting Ending P Maximum in Area: 20.7% in Volume: 73.6% zation: Small (1-33%) No g: Yes Position (o'clock): 12 osition (o'clock): 4 Distance: (cm) 0.4 Wound Description Classification: Grade 2 Foul Odor Wound Margin: Thickened Slough/Fib Exudate Amount: Large Exudate Type: Purulent Exudate Color: yellow, brown, green Wound Bed Granulation Amount: Large (67-100%) Granulation Quality: Red, Pink, Hyper-granulation Fascia Exp Necrotic Amount: None Present (0%) Fat Layer Tendon Exp Muscle Exp Joint Expo Bone Expos After Cleansing: No rino No Exposed Structure osed: No (Subcutaneous Tissue)  Exposed: Yes osed: No osed: No sed: No ed: No Electronic Signature(s) Signed: 04/08/2019 5:27:21 PM By: [name] Entered By: [name] on 04/08/2019 13:12:16 -------------------------------------------------------------------------------- Vitals Details Patient Name: Date of Service: [name]. 04/08/2019 12:30 PM Medical Record [id number] Patient Account Number: [id number] Date of Birth/Sex: Treating RN: September 22, 1955 (64 y.o. [name] Primary Care Brynn Reznik: [name] Other Clinician: Referring Nychelle Cassata: Treating Jalani Cullifer/Extender:[name], [facility] Weeks in Treatment: 10 Vital Signs Time Taken: 12:59 Temperature (F): 98.2 Height (in): 68 Pulse (bpm): 75 Weight (lbs): 265 Respiratory Rate (breaths/min): 18 Body Mass Index (BMI): 40.3 Blood Pressure (mmHg): 158/72 Reference Range: 80 - 120 mg / dl Electronic Signature(s) Signed: 05/01/2019 9:21:08 AM By: [name] Entered By: [name] on 04/08/2019 12:59:54

## 2019-05-01 NOTE — Progress Notes (Signed)
Sarah, [name] ([id number]) Visit Report for 03/25/2019 Arrival Information Details Patient Name: Date of Service: [name], [name] 03/25/2019 3:00 PM Medical Record [id number] Patient Account Number: [id number] Date of Birth/Sex: Treating RN: 02-23-1955 (64 y.o. F) Primary Care Antasia Haider: Chevis Pretty Other Clinician: Referring Shailyn Weyandt: Treating Jerik Falletta/Extender:Robson, [name], [facility] Weeks in Treatment: 8 Visit Information History Since Last Visit Added or deleted any medications: No Patient Arrived: Crutches Any new allergies or adverse reactions: No Arrival Time: 15:20 Had a fall or experienced change in No Accompanied By: friend activities of daily living that may affect Transfer Assistance: None risk of falls: Patient Identification Verified: Yes Signs or symptoms of abuse/neglect since last No Secondary Verification Process Completed: Yes visito Patient Requires Transmission-Based No Hospitalized since last visit: No Precautions: Implantable device outside of the clinic excluding No Patient Has Alerts: No cellular tissue based products placed in the center since last visit: Has Dressing in Place as Prescribed: Yes Pain Present Now: No Electronic Signature(s) Signed: 05/01/2019 9:21:08 AM By: Karl Ito Entered By: Karl Ito on 03/25/2019 15:20:42 -------------------------------------------------------------------------------- Encounter Discharge Information Details Patient Name: Date of Service: [name]. 03/25/2019 3:00 PM Medical Record [id number] Patient Account Number: [id number] Date of Birth/Sex: Treating RN: November 30, 1955 (64 y.o. [name] Primary Care Shaelynn Dragos: Chevis Pretty Other Clinician: Referring Lincoln Ginley: Treating Dalayah Deahl/Extender:Robson, [name], [facility] Weeks in Treatment: 8 Encounter Discharge Information Items Discharge Condition: Stable Ambulatory  Status: Crutches Discharge Destination: Home Transportation: Private Auto Accompanied By: self Schedule Follow-up Appointment: Yes Clinical Summary of Care: Electronic Signature(s) Signed: 03/25/2019 5:42:31 PM By: Shawn Stall Entered By: Shawn Stall on 03/25/2019 16:46:00 -------------------------------------------------------------------------------- Lower Extremity Assessment Details Patient Name: Date of Service: [name], [name] 03/25/2019 3:00 PM Medical Record [id number] Patient Account Number: [id number] Date of Birth/Sex: Treating RN: December 29, 1955 (63 y.o. [name] Primary Care Anie Juniel: Chevis Pretty Other Clinician: Referring Shawndrea Rutkowski: Treating Shadavia Dampier/Extender:Robson, [name], [facility] Weeks in Treatment: 8 Edema Assessment Assessed: [Left: No] [Right: No] Edema: [Left: Yes] [Right: Yes] Calf Left: Right: Point of Measurement: 43 cm From Medial Instep 39 cm 42 cm Ankle Left: Right: Point of Measurement: 12 cm From Medial Instep 27 cm 28 cm Electronic Signature(s) Signed: 03/27/2019 7:20:02 AM By: Yevonne Pax RN Entered By: Yevonne Pax on 03/25/2019 15:46:35 -------------------------------------------------------------------------------- Multi Wound Chart Details Patient Name: Date of Service: [name]. 03/25/2019 3:00 PM Medical Record [id number] Patient Account Number: [id number] Date of Birth/Sex: Treating RN: [birth year] (63 y.o. 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] [9:Lower Extremity] Comorbid History: [10:Glaucoma, Chronic sinus Glaucoma, Chronic sinus N/A problems/congestion, Anemia, Sleep Apnea, Hypertension, Type II Diabetes, Rheumatoid Arthritis, Neuropathy] [9:problems/congestion, Anemia, Sleep Apnea, Hypertension, Type II  Diabetes, Rheumatoid Arthritis, Neuropathy] Date Acquired: [10:11/18/2018] [9:06/18/2018] [N/A:N/A] Weeks of Treatment: [10:8] [9:8] [N/A:N/A] Wound Status: [10:Open] [9:Open] [N/A:N/A] Measurements L x W x D 1.4x1.5x1.9 [9:4x3x0.2] [N/A:N/A] (cm) Area (cm) : [10:1.649] [9:9.425] [N/A:N/A] Volume (cm) : [10:3.134] [9:1.885] [N/A:N/A] % Reduction in Area: [10:6.30%] [9:14.30%] [N/A:N/A] % Reduction in Volume: -18.80% [9:81.00%] [N/A:N/A] Classification: [10:Grade 2] [9:Grade 2] [N/A:N/A] Exudate Amount: [10:Medium] [9:Large] [N/A:N/A] Exudate Type: [10:Serosanguineous] [9:Serosanguineous] [N/A:N/A] Exudate  Color: [10:red, brown] [9:red, brown] [N/A:N/A] Wound Margin: [10:Thickened] [9:Thickened] [N/A:N/A] Granulation Amount: [10:Large (67-100%)] [9:Large (67-100%)] [N/A:N/A] Granulation Quality: [10:Red] [9:Red, Pink] [N/A:N/A] Necrotic Amount: [10:Small (1-33%)] [9:Small (1-33%)] [N/A:N/A] Exposed Structures: [10:Fat Layer (Subcutaneous Fat Layer (Subcutaneous N/A Tissue) Exposed: Yes Muscle: Yes Bone: Yes Fascia: No Tendon: No Joint: No] [9:Tissue) Exposed: Yes Fascia: No Tendon: No Muscle: No Joint: No Bone: No] Epithelialization: [10:None] [9:Small (1-33%) Total Contact Cast] [N/A:N/A N/A] Treatment Notes Electronic Signature(s) Signed: 03/25/2019 5:29:15 PM By: Linton Ham MD Entered By: Linton Ham on 03/25/2019 16:33:40 -------------------------------------------------------------------------------- Multi-Disciplinary Care Plan Details Patient Name: Date of  Service: [name]. 03/25/2019 3:00 PM Medical Record [id number] Patient Account Number: [id number] Date of Birth/Sex: Treating RN: 01/01/1956 (63 y.o. [name] Primary Care Shalom Ware: Dewayne Hatch Other Clinician: Referring Deni Lefever: Treating Aiya Keach/Extender:Robson, [name], [facility] Weeks in Treatment: 8 Active Inactive Wound/Skin Impairment Nursing Diagnoses: Impaired tissue integrity Knowledge deficit related to ulceration/compromised skin integrity Goals: Patient/caregiver will verbalize understanding of skin care regimen Date Initiated: 01/28/2019 Target Resolution Date: 04/26/2019 Goal Status: Active Interventions: Assess patient/caregiver ability to obtain necessary supplies Assess patient/caregiver ability to perform ulcer/skin care regimen upon admission and as needed Assess ulceration(s) every visit Provide education on ulcer and skin care Notes: Electronic Signature(s) Signed: 03/25/2019 5:59:01 PM By: Levan Hurst RN, BSN Entered By: Levan Hurst on 03/25/2019 17:49:13 -------------------------------------------------------------------------------- Pain Assessment Details Patient Name: Date of Service: [name]. 03/25/2019 3:00 PM Medical Record [id number] Patient Account Number: [id number] Date of Birth/Sex: Treating RN: [birth year] (64 y.o. F) Primary Care Palestine Mosco: Dewayne Hatch Other Clinician: Referring Ziare Cryder: Treating Simrah Chatham/Extender:Robson, [name], [facility] Weeks in Treatment: 8 Active Problems Location of Pain Severity and Description of Pain Patient Has Paino No Site Locations Pain Management and Medication Current Pain Management: Electronic Signature(s) Signed: 05/01/2019 9:21:08 AM By: Sandre Kitty Entered By: Sandre Kitty on 03/25/2019 15:23:11 -------------------------------------------------------------------------------- Patient/Caregiver Education  Details Patient Name: [name] 3/8/2021andnbsp3:00 Date of Service: PM Medical Record [id number] Number: Patient Account Number: [id number] Date of Birth/Gender: 02-24-1955 (63 y.o. F) Treating RN: Levan Hurst Other Clinician: Myrtie Hawk, Primary Care Physician: Dorthula Rue Treating Linton Ham Physician/ExtenderMyrtie Hawk, Referring Physician: Tenna Delaine in Treatment: 8 Education Assessment Education Provided To: Patient Education Topics Provided Wound/Skin Impairment: Methods: Explain/Verbal Responses: State content correctly Electronic Signature(s) Signed: 03/25/2019 5:59:01 PM By: Levan Hurst RN, BSN Entered By: Levan Hurst on 03/25/2019 17:49:22 -------------------------------------------------------------------------------- Wound Assessment Details Patient Name: Date of Service: [name]. 03/25/2019 3:00 PM Medical Record [id number] Patient Account Number: [id number] Date of Birth/Sex: Treating RN: [birth year] (63 y.o. F) Primary Care Zoltan Genest: Dewayne Hatch Other Clinician: Referring Keymari Sato: Treating Xzaria Teo/Extender:Robson, [name], [facility] Weeks in Treatment: 8 Wound Status Wound Number: 10 Primary Diabetic Wound/Ulcer of the Lower Extremity Etiology: Wound Location: Left Foot - Plantar, Lateral Wound Open Wounding Event: Blister Status: Date Acquired: 11/18/2018 Comorbid Glaucoma, Chronic sinus Weeks Of Treatment: 8 History: problems/congestion, Anemia, Sleep Apnea, Clustered Wound: No Hypertension, Type II Diabetes, Rheumatoid Arthritis, Neuropathy Photos Wound Measurements Length: (cm) 1.4 Width: (cm) 1.5 Depth: (cm) 1.9 Area: (cm) 1.649 Volume: (cm) 3.134 Wound Description Classification: Grade 2 Wound Margin: Thickened Exudate Amount: Medium Exudate Type: Serosanguineous Exudate Color: red, brown Wound Bed Granulation Amount: Large (67-100%) Granulation Quality:  Red Necrotic Amount: Small (1-33%) Necrotic Quality: Adherent Slough ter Cleansing: No no Yes Exposed Structure ed: No ubcutaneous Tissue) Exposed: Yes ed: No ed: Yes s of Muscle: No d: No : Yes % Reduction in Area: 6.3% % Reduction in Volume: -18.8% Epithelialization:  None Tunneling: No Undermining: No Foul Odor Af Slough/Fibri Fascia Expos Fat Layer (S Tendon Expos Muscle Expos Necrosi Joint Expose Bone Exposed Electronic Signature(s) Signed: 03/26/2019 3:29:05 PM By: Benjaman Kindler EMT/HBOT Entered By: Benjaman Kindler on 03/26/2019 14:14:40 -------------------------------------------------------------------------------- Wound Assessment Details Patient Name: Date of Service: [facility] 03/25/2019 3:00 PM Medical Record [id number] Patient Account Number: [id number] Date of Birth/Sex: Treating RN: 21-Oct-1955 (64 y.o. F) Primary Care Quincey Nored: Chevis Pretty Other Clinician: Referring Adonis Ryther: Treating Terence Googe/Extender:Robson, [name], [facility] Weeks in Treatment: 8 Wound Status Wound Number: 9 Primary Diabetic Wound/Ulcer of the Lower Extremity Etiology: Wound Location: Right Foot - Medial Wound Open Wounding Event: Blister Status: Date Acquired: 06/18/2018 Comorbid Glaucoma, Chronic sinus Weeks Of Treatment: 8 History: problems/congestion, Anemia, Sleep Apnea, Clustered Wound: No Hypertension, Type II Diabetes, Rheumatoid Arthritis, Neuropathy Photos Wound Measurements Length: (cm) 4 Width: (cm) 3 Depth: (cm) 0.2 Area: (cm) 9.425 Volume: (cm) 1.885 Wound Description Classification: Grade 2 Wound Margin: Thickened Exudate Amount: Large Exudate Type: Serosanguineous Exudate Color: red, brown Wound Bed Granulation Amount: Large (67-100%) Granulation Quality: Red, Pink Necrotic Amount: Small (1-33%) Necrotic Quality: Adherent Slough After Cleansing: No rino Yes Exposed Structure sed: No Subcutaneous Tissue)  Exposed: Yes sed: No sed: No ed: No d: No % Reduction in Area: 14.3% % Reduction in Volume: 81% Epithelialization: Small (1-33%) Tunneling: No Undermining: No Foul Odor Slough/Fib Fascia Expo Fat Layer ( Tendon Expo Muscle Expo Joint Expos Bone Expose Electronic Signature(s) Signed: 03/26/2019 3:29:05 PM By: Benjaman Kindler EMT/HBOT Entered By: Benjaman Kindler on 03/26/2019 14:09:38 -------------------------------------------------------------------------------- Vitals Details Patient Name: Date of Service: [name]. 03/25/2019 3:00 PM Medical Record [id number] Patient Account Number: [id number] Date of Birth/Sex: Treating RN: 06/12/1955 (64 y.o. F) Primary Care Addisynn Vassell: Chevis Pretty Other Clinician: Referring Shamonique Battiste: Treating Alicya Bena/Extender:Robson, [name], [facility] Weeks in Treatment: 8 Vital Signs Time Taken: 15:20 Temperature (F): 98.2 Height (in): 68 Pulse (bpm): 75 Weight (lbs): 265 Respiratory Rate (breaths/min): 18 Body Mass Index (BMI): 40.3 Blood Pressure (mmHg): 137/55 Reference Range: 80 - 120 mg / dl Electronic Signature(s) Signed: 05/01/2019 9:21:08 AM By: Karl Ito Entered By: Karl Ito on 03/25/2019 15:23:04

## 2019-05-01 NOTE — Progress Notes (Signed)
[name], [name] ([id number]) Visit Report for 04/01/2019 Arrival Information Details Patient Name: Date of Service: [name], [name] 04/01/2019 3:15 PM Medical Record [id number] Patient Account Number: [id number] Date of Birth/Sex: Treating RN: 30-May-1955 (64 y.o. F) Dwiggins, Shannon Primary Care [name]: Chevis Pretty Other Clinician: Referring Benno Brensinger: Treating Cori Henningsen/Extender:Robson, [name], [facility] Weeks in Treatment: 9 Visit Information History Since Last Visit Added or deleted any medications: No Patient Arrived: Crutches Any new allergies or adverse reactions: No Arrival Time: 15:21 Had a fall or experienced change in No Accompanied By: friend activities of daily living that may affect Transfer Assistance: None risk of falls: Patient Identification Verified: Yes Signs or symptoms of abuse/neglect since No Secondary Verification Process Completed: Yes last visito Patient Requires Transmission-Based No Hospitalized since last visit: No Precautions: Implantable device outside of the clinic No Patient Has Alerts: No excluding cellular tissue based products placed in the center since last visit: Has Dressing in Place as Prescribed: Yes Has Footwear/Offloading in Place as Yes Prescribed: Right: Total Contact Cast Pain Present Now: No Electronic Signature(s) Signed: 04/01/2019 5:25:45 PM By: [name] Entered By: [name] on 04/01/2019 15:22:10 -------------------------------------------------------------------------------- Encounter Discharge Information Details Patient Name: Date of Service: [name]. 04/01/2019 3:15 PM Medical Record [id number] Patient Account Number: [id number] Date of Birth/Sex: Treating RN: July 05, 1955 (64 y.o. [name] Primary Care Kitana Gage: Chevis Pretty Other Clinician: Referring Azari Hasler: Treating Emmani Lesueur/Extender:Robson, [name],  [facility] Weeks in Treatment: 9 Encounter Discharge Information Items Post Procedure Vitals Discharge Condition: Stable Temperature (F): 97.7 Ambulatory Status: Crutches Pulse (bpm): 79 Discharge Destination: Home Respiratory Rate (breaths/min): 18 Transportation: Private Auto Blood Pressure (mmHg): 136/78 Accompanied By: self Schedule Follow-up Appointment: Yes Clinical Summary of Care: Electronic Signature(s) Signed: 04/01/2019 5:25:09 PM By: Shawn Stall Entered By: Shawn Stall on 04/01/2019 16:29:51 -------------------------------------------------------------------------------- Lower Extremity Assessment Details Patient Name: Date of Service: [name], [name] 04/01/2019 3:15 PM Medical Record [id number] Patient Account Number: [id number] Date of Birth/Sex: Treating RN: 12/06/1955 (63 y.o. [name] Primary Care Peter Daquila: Chevis Pretty Other Clinician: Referring Demonica Farrey: Treating Tamario Heal/Extender:Robson, [name], [facility] Weeks in Treatment: 9 Edema Assessment Assessed: [Left: No] [Right: No] Edema: [Left: Yes] [Right: Yes] Calf Left: Right: Point of Measurement: 43 cm From Medial Instep 39 cm 38.5 cm Ankle Left: Right: Point of Measurement: 12 cm From Medial Instep 27 cm 25 cm Vascular Assessment Pulses: Dorsalis Pedis Palpable: [Left:Yes] [Right:Yes] Electronic Signature(s) Signed: 04/01/2019 5:25:45 PM By: [name] Entered By: [name] on 04/01/2019 15:32:59 -------------------------------------------------------------------------------- Multi Wound Chart Details Patient Name: Date of Service: [name]. 04/01/2019 3:15 PM Medical Record [id number] Patient Account Number: [id number] Date of Birth/Sex: Treating RN: 09-Jul-1955 (64 y.o. 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] [9:Lower Extremity] Comorbid History: [10:Glaucoma, Chronic sinus Glaucoma, Chronic sinus N/A problems/congestion, Anemia, Sleep Apnea, Hypertension, Type II Diabetes, Rheumatoid Arthritis, Neuropathy] [9:problems/congestion, Anemia, Sleep Apnea, Hypertension, Type II  Diabetes, Rheumatoid Arthritis, Neuropathy] Date Acquired: [10:11/18/2018] [9:06/18/2018] [N/A:N/A] Weeks of Treatment: [10:9] [9:9] [N/A:N/A] Wound Status: [10:Open] [9:Open] [N/A:N/A] Measurements L x W x D 1.4x1.5x0.9 [9:4x3x0.7] [N/A:N/A] (cm) Area (cm) : [10:1.649] [  9:9.425] [N/A:N/A] Volume (cm) : [10:1.484] [5:7.322] [N/A:N/A] % Reduction in Area: [10:6.30%] [9:14.30%] [N/A:N/A] % Reduction in Volume: 43.80% [9:33.30%] [N/A:N/A] Classification: [10:Grade 2] [9:Grade 2] [N/A:N/A] Exudate Amount: [10:Medium] [9:Large] [N/A:N/A] Exudate Type: [10:Serosanguineous] [9:Purulent] [N/A:N/A] Exudate Color: [10:red, brown] [9:yellow, brown, green] [N/A:N/A] Wound Margin: [10:Thickened] [9:Thickened] [N/A:N/A] Granulation Amount: [10:Large (67-100%)] [9:Large (67-100%)] [N/A:N/A] Granulation Quality: [10:Red, Pink] [9:Red, Pink] [N/A:N/A] Necrotic Amount: [10:None Present (0%)] [9:None Present (0%)] [N/A:N/A] Exposed Structures: [10:Fat Layer (Subcutaneous Fat Layer (Subcutaneous N/A Tissue) Exposed: Yes Muscle: Yes Fascia: No Tendon: No Joint: No Bone: No] [9:Tissue) Exposed: Yes Fascia: No Tendon: No Muscle: No Joint: No Bone:  No] Epithelialization: [10:Small (1-33%)] [9:Small (1-33%)] [N/A:N/A] Debridement: [10:N/A] [9:Debridement - Excisional] [N/A:N/A] Pre-procedure [10:N/A] [9:15:55] [N/A:N/A] Verification/Time Out Taken: Pain Control: [10:N/A] [9:Other] [N/A:N/A] Tissue Debrided: [10:N/A] [9:Callus, Subcutaneous] [N/A:N/A] Level: [10:N/A] [9:Skin/Subcutaneous Tissue] [N/A:N/A] Debridement Area (sq cm):N/A [9:12] [N/A:N/A] Instrument: [10:N/A] [9:Blade, Forceps] [N/A:N/A] Bleeding: [10:N/A] [9:Moderate] [N/A:N/A] Hemostasis Achieved: [10:N/A] [9:Silver Nitrate] [N/A:N/A] Procedural Pain: [10:N/A] [9:0] [N/A:N/A] Post Procedural Pain: [10:N/A] [9:0] [N/A:N/A] Debridement Treatment N/A [9:Procedure was tolerated] [N/A:N/A] Response: [9:well] Post Debridement [10:N/A] [9:4x3x0.7] [N/A:N/A] Measurements L x W x D (cm) Post Debridement [10:N/A] [9:6.597] [N/A:N/A] Volume: (cm) Procedures Performed: N/A [9:Debridement Total Contact Cast] [N/A:N/A] Treatment Notes Wound #10 (Left, Lateral, Plantar Foot) 1. Cleanse With Wound Cleanser Soap and water 2. Periwound Care Moisturizing lotion 3. Primary Dressing Applied Hydrofera Blue 4. Secondary Dressing Dry Gauze Roll Gauze Foam 5. Secured With Medipore tape Notes foam donut as secondary. stockinette Wound #9 (Right, Medial Foot) 1. Cleanse With Wound Cleanser Soap and water 2. Periwound Care Moisturizing lotion 3. Primary Dressing Applied Hydrofera Blue 4. Secondary Dressing Dry Gauze Roll Gauze Kerramax/Xtrasorb Other secondary dressing (specify in notes) 5. Secured With Medipore tape 7. Footwear/Offloading device applied Total Contact Cast Notes carboflex. TCC applied by MD. Electronic Signature(s) Signed: 04/01/2019 5:44:18 PM By: Levan Hurst RN, BSN Signed: 04/02/2019 10:32:20 AM By: Linton Ham MD Entered By: Linton Ham on 04/01/2019  17:29:34 -------------------------------------------------------------------------------- Multi-Disciplinary Care Plan Details Patient Name: Date of Service: [facility] 04/01/2019 3:15 PM Medical Record [id number] Patient Account Number: [id number] Date of Birth/Sex: Treating RN: April 25, 1955 (64 y.o. [name] Primary Care Simcha Farrington: Dewayne Hatch Other Clinician: Referring Cantrell Martus: Treating Caili Escalera/Extender:Robson, [name], [facility] Weeks in Treatment: 9 Active Inactive Wound/Skin Impairment Nursing Diagnoses: Impaired tissue integrity Knowledge deficit related to ulceration/compromised skin integrity Goals: Patient/caregiver will verbalize understanding of skin care regimen Date Initiated: 01/28/2019 Target Resolution Date: 05/16/2019 Goal Status: Active Interventions: Assess patient/caregiver ability to obtain necessary supplies Assess patient/caregiver ability to perform ulcer/skin care regimen upon admission and as needed Assess ulceration(s) every visit Provide education on ulcer and skin care Notes: Electronic Signature(s) Signed: 04/01/2019 5:44:18 PM By: Levan Hurst RN, BSN Entered By: Levan Hurst on 04/01/2019 16:32:10 -------------------------------------------------------------------------------- Pain Assessment Details Patient Name: Date of Service: [name] 04/01/2019 3:15 PM Medical Record [id number] Patient Account Number: [id number] Date of Birth/Sex: Treating RN: 12/12/1955 (64 y.o. Clearnce Sorrel Primary Care Terell Kincy: Dewayne Hatch Other Clinician: Referring Kelis Plasse: Treating Emmily Pellegrin/Extender:Robson, [name], [facility] Weeks in Treatment: 9 Active Problems Location of Pain Severity and Description of Pain Patient Has Paino No Site Locations Pain Management and Medication Current Pain Management: Electronic Signature(s) Signed: 04/01/2019 5:25:45 PM By:  Kela Millin Entered By: Kela Millin on 04/01/2019 15:25:09 -------------------------------------------------------------------------------- Patient/Caregiver Education Details Patient Name: [name] 3/15/2021andnbsp3:15 Date of Service: PM Medical Record [id number] Number: Patient Account Number: [id number] Date of Birth/Gender: 09-11-1955 (63 y.o. F)  Treating RN: Zandra Abts Primary Care Other Clinician: Maryla Morrow, Physician: Humphrey Rolls Physician/ExtenderMaryla Morrow, Referring Physician: Glorianne Manchester in Treatment: 9 Education Assessment Education Provided To: Patient Education Topics Provided Wound/Skin Impairment: Methods: Explain/Verbal Responses: State content correctly Electronic Signature(s) Signed: 04/01/2019 5:44:18 PM By: Zandra Abts RN, BSN Entered By: Zandra Abts on 04/01/2019 16:32:20 -------------------------------------------------------------------------------- Wound Assessment Details Patient Name: Date of Service: [name] 04/01/2019 3:15 PM Medical Record [id number] Patient Account Number: [id number] Date of Birth/Sex: Treating RN: 11-22-1955 (63 y.o. [name] Primary Care Mallery Harshman: Chevis Pretty Other Clinician: Referring Greer Wainright: Treating Romelle Reiley/Extender:Robson, [name], [facility] Weeks in Treatment: 9 Wound Status Wound Number: 10 Primary Diabetic Wound/Ulcer of the Lower Extremity Etiology: Wound Location: Left Foot - Plantar, Lateral Wound Open Wounding Event: Blister Status: Date Acquired: 11/18/2018 Comorbid Glaucoma, Chronic sinus Weeks Of Treatment: 9 History: problems/congestion, Anemia, Sleep Apnea, Clustered Wound: No Hypertension, Type II Diabetes, Rheumatoid Arthritis, Neuropathy Photos Wound Measurements Length: (cm) 1.4 Width: (cm) 1.5 Depth: (cm) 0.9 Area: (cm) 1.649 Volume: (cm) 1.484 Wound  Description Classification: Grade 2 Wound Margin: Thickened Exudate Amount: Medium Exudate Type: Serosanguineous Exudate Color: red, brown Wound Bed Granulation Amount: Large (67-100%) Granulation Quality: Red, Pink Necrotic Amount: None Present (0%) dor After Cleansing: No /Fibrino No Exposed Structure osed: No (Subcutaneous Tissue) Exposed: Yes osed: No osed: Yes sis of Muscle: No sed: No ed: No % Reduction in Area: 6.3% % Reduction in Volume: 43.8% Epithelialization: Small (1-33%) Tunneling: No Undermining: No Foul O Slough Fascia Exp Fat Layer Tendon Exp Muscle Exp Necro Joint Expo Bone Expos Electronic Signature(s) Signed: 04/02/2019 4:45:30 PM By: Benjaman Kindler EMT/HBOT Signed: 05/01/2019 9:03:51 AM By: Zandra Abts RN, BSN Previous Signature: 04/01/2019 5:25:45 PM Version By: [name] Entered By: Benjaman Kindler on 04/02/2019 10:01:34 -------------------------------------------------------------------------------- Wound Assessment Details Patient Name: Date of Service: [name]. 04/01/2019 3:15 PM Medical Record [id number] Patient Account Number: [id number] Date of Birth/Sex: Treating RN: November 02, 1955 (64 y.o. [name] Primary Care Berel Najjar: Chevis Pretty Other Clinician: Referring Charene Mccallister: Treating Joydan Gretzinger/Extender:Robson, [name], [facility] Weeks in Treatment: 9 Wound Status Wound Number: 9 Primary Diabetic Wound/Ulcer of the Lower Extremity Etiology: Wound Location: Right Foot - Medial Wound Open Wounding Event: Blister Status: Date Acquired: 06/18/2018 Comorbid Glaucoma, Chronic sinus Weeks Of Treatment: 9 History: problems/congestion, Anemia, Sleep Apnea, Clustered Wound: No Hypertension, Type II Diabetes, Rheumatoid Arthritis, Neuropathy Photos Wound Measurements Length: (cm) 4 Width: (cm) 3 Depth: (cm) 0.7 Area: (cm) 9.425 Volume: (cm) 6.597 Wound Description Classification: Grade  2 Wound Margin: Thickened Exudate Amount: Large Exudate Type: Purulent Exudate Color: yellow, brown, green Wound Bed Granulation Amount: Large (67-100%) Granulation Quality: Red, Pink Necrotic Amount: None Present (0%) Foul Odor After Cleansing: N Slough/Fibrino N Exposed Structure Fascia Exposed: N Fat Layer (Subcutaneous Tissue) Exposed: Y Tendon Exposed: N Muscle Exposed: N Joint Exposed: N Bone Exposed: N % Reduction in Area: 14.3% % Reduction in Volume: 33.3% Epithelialization: Small (1-33%) Tunneling: No Undermining: No o o o es o o o o Psychologist, [facility] and probation services) Signed: 04/02/2019 4:45:30 PM By: Benjaman Kindler EMT/HBOT Signed: 05/01/2019 9:03:51 AM By: Zandra Abts RN, BSN Previous Signature: 04/01/2019 5:25:45 PM Version By: [name] Entered By: Benjaman Kindler on 04/02/2019 10:01:58 -------------------------------------------------------------------------------- Vitals Details Patient Name: Date of Service: [name]. 04/01/2019 3:15 PM Medical Record [id number] Patient Account Number: [id number] Date of Birth/Sex: Treating RN: 04/12/1955 (64 y.o. [name] Primary Care Jakayden Cancio: Chevis Pretty Other Clinician: Referring Rochella Benner: Treating Cacie Gaskins/Extender:Robson, [name], [facility] Weeks in  Treatment: 9 Vital Signs Time Taken: 15:15 Temperature (F): 97.7 Height (in): 68 Pulse (bpm): 79 Weight (lbs): 265 Respiratory Rate (breaths/min): 18 Body Mass Index (BMI): 40.3 Blood Pressure (mmHg): 136/78 Reference Range: 80 - 120 mg / dl Electronic Signature(s) Signed: 04/01/2019 5:25:45 PM By: [name] Entered By: [name] on 04/01/2019 15:24:59

## 2019-05-02 ENCOUNTER — Other Ambulatory Visit: Payer: Self-pay

## 2019-05-02 ENCOUNTER — Encounter (HOSPITAL_BASED_OUTPATIENT_CLINIC_OR_DEPARTMENT_OTHER): Payer: Medicare Other | Admitting: Internal Medicine

## 2019-05-02 DIAGNOSIS — E11621 Type 2 diabetes mellitus with foot ulcer: Secondary | ICD-10-CM | POA: Diagnosis not present

## 2019-05-04 NOTE — Progress Notes (Signed)
SEFORA, TIETJE ([id number]) Visit Report for 05/02/2019 HPI Details Patient Name: Date of Service: [name], [name] 05/02/2019 1:00 PM Medical Record [id number] Patient Account Number: [id number] Date of Birth/Sex: Treating RN: [birth year] (64 y.o. [name] Primary Care Provider: Chevis Pretty Other Clinician: Referring Provider: Treating Provider/Extender:Aziz Slape, [name], [facility] Weeks in Treatment: 13 History of Present Illness HPI Description: 64 yrs old bf here for follow up recurrent left charcot foot ulcer. She has DM. She is not a smoker. She states a blister developed 3 months ago at site of previous breakdown from 1 year ago. It was debrided at the podiatrist's office. She went to the ER and then sent here. She denies pain. Xray was negative for osteo. Culture from this clinic negative. 09/08/14 [name] to [name] for Charcot foot. Seen by him and MRI scheduled 09/19/14. Prior silver collagen but this was not ordered last visit, patient has been rinsing with saline alone. Re institute silver collagen every other day. F/u 2 weeks with [name] as Labor Day holiday. Supplies ordered. 09/25/14; this is a patient with a Charcot foot on the left. she has a wound over the plantar aspect of her left calcaneus. She has been seen by [name] of orthopedic surgery and has had an MRI. She is apparently going within the next week or 2 for corrective surgery which will involve an external fixator. I don't have any of the details of this. 10/06/14; The patient is a 64 yrs old bf with a left Charcot foot and ulcer on the plantar. 12/01/14 Returns post surgery with [name]. Has follow up visit later this week with him, currently in facility and NWB over this extremity. States no drainage from foot and no current wound care. On exam callus and scab in place, suspect healed. Has external fixator in place. Will defer to [name]  for management, ok to place moisturizing cream over scabs and callus and she will call if she requires follow up here. READMISSION 01/28/2019 Patient was in the clinic in 2016 for a wound on the plantar aspect of her left calcaneus. Predominantly cared for by [name] she was referred to [name] and had corrective surgery for the position of her left ankle I believe. She had previously been here in 2015 and then prior to that in 2011 2012 that I do not have these records. More recently she has developed fairly extensive wounds on the right medial foot, left lateral foot and a small area on the right medial great toe. Right medial foot wound has been there for 6 to 7 months, left foot wound for 2 to 3 months and the right great toe only over the last few weeks. She has been followed by [name] at Dr. Laverta [place] office it sounds as though she has been undergoing callus paring and silver nitrate. The patient has been washing these off with soap and water and plan applying dry dressing Past medical history, type 2 diabetes well controlled with a recent hemoglobin A1c of 7 on 11/16, type 2 diabetes with peripheral neuropathy, previous left Charcot joint surgery bilateral Charcot joints currently, stage III chronic renal failure, hypertension, obstructive sleep apnea, history of MRSA and gastroesophageal reflux. 1/18; this is a patient with bilateral Charcot feet. Plain x-rays that I did of both of these wounds that are either at bone or precariously close to bone were done. On the right foot this showed possible lytic destruction involving the anterior aspect  of the talus which may represent a wound osteomyelitis. An MRI was recommended. On the left there was no definite lytic destruction although the wound is deep undermining's and I think probably requires an MRI on this basis in any case 1/25; patient was supposed to have her MRI last Friday of her bilateral feet however they would not  do it because there was silver alginate in her dressings, will replace with calcium alginate today and will see if we can get her done today 2/1; patient did not get her MRIs done last week because of issues with the MRI department receiving orders to do bilateral feet. She has 2 deep wounds in the setting of Charcot feet deformities bilaterally. Both of them at one point had exposed bone although I had trouble demonstrating that today. I am not sure that we can offload these very aggressively as I watched her leave the clinic last week her gait is very narrow and unsteady. 2/15; the MRI of her right foot did not show osteomyelitis potentially osteonecrosis of the talus. 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 The wounds are a large wound on the right medial foot and on the left a substantial wound on the left lateral plantar calcaneus. We have been using silver alginate. Ultimately I think we need to consider a total contact cast on the right while we work through the possibility of osteomyelitis in the left. I watched her walk out with her crutches I have some trepidation about the ability to walk with the cast on her right foot. Nevertheless with her Charcot deformities I do not think there is a way to heal these short of offloading them aggressively. 2/22; the culture of the bone in the left foot that I did last week showed a few Citrobacter Koseri as well as some streptococcal species. In my attempted bone for pathology did not actually yield a lot of bone the pathologist did not identify any osteomyelitis. 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 Is promised today and aggressive debridement of the right foot and a total contact cast which surprisingly she seemed to tolerate quite well 03/13/2019  upon evaluation today patient appears to be doing well with her total contact cast she is here for the first cast change. She had no areas of rubbing or any other complications at this point. Overall things are doing quite well. 2/26; the patient was kindly seen by Dr. Orvan Falconer of infectious disease on 2/25. She is now on IV vancomycin PICC line placement in 2 days and oral Flagyl. This was in response to her MRI showing osteomyelitis of the left calcaneus concerning for osteomyelitis. We have been putting her right leg in a total contact cast. Our nurses report drainage. She is tolerating the total contact cast well. 3/4; the patient is started her IV antibiotics and is taking IV vancomycin and oral Flagyl. She appears to be tolerating these both well. This is for osteomyelitis involving the left calcaneus. The area on the right in the setting of bilateral Charcot deformities did not have any bone infection on MRI. We put a total contact cast on her with silver alginate as the primary dressing and she returns today in follow-up. Per our intake nurse there was too much drainage to leave this on all week therefore  we will bring her back for an early change 3/8; follow-up total contact cast she is still having a lot of drainage probably too much drainage from the right foot wound will week with the same cast. She will be back on Thursday. The area on the left looks somewhat better 3/11; patient here for a total contact cast change. 3/15; patient came in for wound care evaluation. She is still on IV vancomycin and oral Flagyl for osteomyelitis in the left foot. The area on the right foot we are putting in a total contact cast. 3/18 for total contact cast change on the right foot 3/22; the patient was here for wound review today. The area on the right foot is slightly smaller in terms of surface area. However the surface of the wound is very gritty and hyper granulated. More problematically the area  on the plantar left foot has increased depth indeed in the center of this it probes to bone. We have been using Hydrofera Blue in both areas. She is on antibiotics as prescribed by infectious disease IV vancomycin and oral Flagyl 3/25; the patient is in for total contact cast change. Our intake nurse was concerned about the amount of drainage which soaked right to the multiple layers we are putting on this. Wondered about options. The wound bed actually looks as healthy as this is looked although there may not be a lot of change in dimensions things are looking a lot better. If we are going to heal this woman's wound which is in the middle of her right Charcot foot this is going to need to be offloaded. There are not many alternatives 3/29; still not a lot of improvement although the surface of the right wound looks better. We have been using Hydrofera Blue 04/17/2019 upon evaluation today patient appears to be doing well with a total contact cast. With actually having to change this twice a week. She saw [name] for wound care earlier in the week this is for the cast change today. That is due to the fact that she has significant drainage. Overall the wound is been looking excellent however. 4/5; we continue to put a total contact cast on this patient with Hydrofera Blue. Still a lot of drainage which precludes a simple weekly change or changing this twice a week. 4/8; total contact cast changed today. Apparently the wound looks better per our intake nurse 4/12; patient here for wound care evaluation. Wounds on the right foot have not changed in dimension none about a month left foot looks similar. Apparently her antibiotics that we are giving her for osteomyelitis in the left foot complete on Wednesday. We have been using Hydrofera Blue on the right foot under a total contact cast. Nurses report greenish drainage although I think this may be discoloration from the [facility] 4/15; back for  cast change wounds look quite good although still moderate amount of drainage. Total contact cast reapplied Electronic Signature(s) Signed: 05/04/2019 6:55:41 AM By: Baltazar Najjar MD Entered By: Baltazar Najjar on 05/02/2019 14:08:31 -------------------------------------------------------------------------------- Physical Exam Details Patient Name: Date of Service: [name] 05/02/2019 1:00 PM Medical Record [id number] Patient Account Number: [id number] Date of Birth/Sex: Treating RN: 03-12-1955 (63 y.o. [name] Primary Care Provider: Chevis Pretty Other Clinician: Referring Provider: Treating Provider/Extender:Oziah Vitanza, [name], [facility] Weeks in Treatment: 13 Constitutional Sitting or standing Blood Pressure is within target range for patient.. Pulse regular and within target range for patient.Marland Kitchen Respirations regular, non-labored and within target range.Marland Kitchen  Temperature is normal and within the target range for the patient.Marland Kitchen Appears in no distress. Notes Wound exam; the wound looks slightly smaller certainly a better looking surface. Wound edges look better as well. Electronic Signature(s) Signed: 05/04/2019 6:55:41 AM By: Baltazar Najjar MD Entered By: Baltazar Najjar on 05/02/2019 14:09:15 -------------------------------------------------------------------------------- Physician Orders Details Patient Name: Date of Service: [name] 05/02/2019 1:00 PM Medical Record [id number] Patient Account Number: [id number] Date of Birth/Sex: Treating RN: 09-Dec-1955 (63 y.o. Debara Pickett, Yvonne Kendall Primary Care Provider: Chevis Pretty Other Clinician: Referring Provider: Treating Provider/Extender:Gavino Fouch, [name], [facility] Weeks in Treatment: 30 Verbal / Phone Orders: No Diagnosis Coding ICD-10 Coding Code Description E11.621 Type 2 diabetes mellitus with foot ulcer L97.524 Non-pressure chronic ulcer of other part of  left foot with necrosis of bone L97.511 Non-pressure chronic ulcer of other part of right foot limited to breakdown of skin L97.514 Non-pressure chronic ulcer of other part of right foot with necrosis of bone E11.42 Type 2 diabetes mellitus with diabetic polyneuropathy M14.671 Charcot's joint, right ankle and foot M14.672 Charcot's joint, left ankle and foot 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 - MD visit Return Appointment in: - Thursday or Friday for cast change Dressing Change Frequency Wound #10 Left,Lateral,Plantar Foot Change Dressing every other day. Wound #9 Right,Medial Foot Do not change entire dressing for one week. Wound Cleansing May shower with protection. Primary Wound Dressing Wound #10 Left,Lateral,Plantar Foot Calcium Alginate with Silver - lightly pack Wound #9 Right,Medial Foot Calcium Alginate with Silver Secondary Dressing Wound #10 Left,Lateral,Plantar Foot Kerlix/Rolled Gauze Dry Gauze Other: - felt callous pad Wound #9 Right,Medial Foot Kerlix/Rolled Gauze Zetuvit or Kerramax Drawtex Carboflex Off-Loading Total Contact Cast to Right Lower Extremity Open toe surgical shoe to: - add felt to sandal also Electronic Signature(s) Signed: 05/02/2019 4:13:32 PM By: Shawn Stall Signed: 05/04/2019 6:55:41 AM By: Baltazar Najjar MD Entered By: Shawn Stall on 05/02/2019 13:52:46 -------------------------------------------------------------------------------- Problem List Details Patient Name: Date of Service: [name] 05/02/2019 1:00 PM Medical Record [id number] Patient Account Number: [id number] Date of Birth/Sex: Treating RN: [birth year] (64 y.o. [name] Primary Care Provider: Chevis Pretty Other Clinician: Referring Provider: Treating Provider/Extender:Heaven Wandell, [name], [facility] Weeks in Treatment: 13 Active  Problems ICD-10 Evaluated Encounter Code Description Active Date Today Diagnosis E11.621 Type 2 diabetes mellitus with foot ulcer 01/28/2019 No Yes L97.524 Non-pressure chronic ulcer of other part of left foot 01/28/2019 No Yes with necrosis of bone L97.511 Non-pressure chronic ulcer of other part of right foot 01/28/2019 No Yes limited to breakdown of skin L97.514 Non-pressure chronic ulcer of other part of right foot 01/28/2019 No Yes with necrosis of bone E11.42 Type 2 diabetes mellitus with diabetic polyneuropathy 01/28/2019 No Yes M14.671 Charcot's joint, right ankle and foot 01/28/2019 No Yes M14.672 Charcot's joint, left ankle and foot 01/28/2019 No Yes 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 05/02/2019 1:00 PM Medical Record [id number] Patient Account Number: [id number] Date of Birth/Sex: Treating RN: 17-Nov-1955 (64 y.o. [name] Primary Care Provider: Chevis Pretty Other Clinician: Referring Provider: Treating Provider/Extender:Storm Sovine, [name], [facility] Weeks in Treatment: 13 Subjective History of Present Illness (HPI) 64 yrs old bf here for follow up recurrent left charcot foot ulcer. She has DM. She is  not a smoker. She states a blister developed 3 months ago at site of previous breakdown from 1 year ago. It was debrided at the podiatrist's office. She went to the ER and then sent here. She denies pain. Xray was negative for osteo. Culture from this clinic negative. 09/08/14 Referred by [name] to [name] for Charcot foot. Seen by him and MRI scheduled 09/19/14. Prior silver collagen but this  was not ordered last visit, patient has been rinsing with saline alone. Re institute silver collagen every other day. F/u 2 weeks with [name] as Labor Day holiday. Supplies ordered. 09/25/14; this is a patient with a Charcot foot on the left. she has a wound over the plantar aspect of her left calcaneus. She has been seen by [name] of orthopedic surgery and has had an MRI. She is apparently going within the next week or 2 for corrective surgery which will involve an external fixator. I don't have any of the details of this. 10/06/14; The patient is a 64 yrs old bf with a left Charcot foot and ulcer on the plantar. 12/01/14 Returns post surgery with [name]. Has follow up visit later this week with him, currently in facility and NWB over this extremity. States no drainage from foot and no current wound care. On exam callus and scab in place, suspect healed. Has external fixator in place. Will defer to [name] for management, ok to place moisturizing cream over scabs and callus and she will call if she requires follow up here. READMISSION 01/28/2019 Patient was in the clinic in 2016 for a wound on the plantar aspect of her left calcaneus. Predominantly cared for by [name] she was referred to [name] and had corrective surgery for the position of her left ankle I believe. She had previously been here in 2015 and then prior to that in 2011 2012 that I do not have these records. More recently she has developed fairly extensive wounds on the right medial foot, left lateral foot and a small area on the right medial great toe. Right medial foot wound has been there for 6 to 7 months, left foot wound for 2 to 3 months and the right great toe only over the last few weeks. She has been followed by [name] at Dr. Laverta [place] office it sounds as though she has been undergoing callus paring and silver nitrate. The patient has been washing these off with soap and water and plan applying  dry dressing Past medical history, type 2 diabetes well controlled with a recent hemoglobin A1c of 7 on 11/16, type 2 diabetes with peripheral neuropathy, previous left Charcot joint surgery bilateral Charcot joints currently, stage III chronic renal failure, hypertension, obstructive sleep apnea, history of MRSA and gastroesophageal reflux. 1/18; this is a patient with bilateral Charcot feet. Plain x-rays that I did of both of these wounds that are either at bone or precariously close to bone were done. On the right foot this showed possible lytic destruction involving the anterior aspect of the talus which may represent a wound osteomyelitis. An MRI was recommended. On the left there was no definite lytic destruction although the wound is deep undermining's and I think probably requires an MRI on this basis in any case 1/25; patient was supposed to have her MRI last Friday of her bilateral feet however they would not do it because there was silver alginate in her dressings, will replace with calcium alginate today and will see if we  can get her done today 2/1; patient did not get her MRIs done last week because of issues with the MRI department receiving orders to do bilateral feet. She has 2 deep wounds in the setting of Charcot feet deformities bilaterally. Both of them at one point had exposed bone although I had trouble demonstrating that today. I am not sure that we can offload these very aggressively as I watched her leave the clinic last week her gait is very narrow and unsteady. 2/15; the MRI of her right foot did not show osteomyelitis potentially osteonecrosis of the talus. 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 The wounds are a large wound on the right medial foot and on the left a substantial wound on the left lateral plantar calcaneus. We have been using silver alginate. Ultimately I think we need to consider a total contact cast  on the right while we work through the possibility of osteomyelitis in the left. I watched her walk out with her crutches I have some trepidation about the ability to walk with the cast on her right foot. Nevertheless with her Charcot deformities I do not think there is a way to heal these short of offloading them aggressively. 2/22; the culture of the bone in the left foot that I did last week showed a few Citrobacter Koseri as well as some streptococcal species. In my attempted bone for pathology did not actually yield a lot of bone the pathologist did not identify any osteomyelitis. 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 Is promised today and aggressive debridement of the right foot and a total contact cast which surprisingly she seemed to tolerate quite well 03/13/2019 upon evaluation today patient appears to be doing well with her total contact cast she is here for the first cast change. She had no areas of rubbing or any other complications at this point. Overall things are doing quite well. 2/26; the patient was kindly seen by Dr. Orvan Falconer of infectious disease on 2/25. She is now on IV vancomycin PICC line placement in 2 days and oral Flagyl. This was in response to her MRI showing osteomyelitis of the left calcaneus concerning for osteomyelitis. We have been putting her right leg in a total contact cast. Our nurses report drainage. She is tolerating the total contact cast well. 3/4; the patient is started her IV antibiotics and is taking IV vancomycin and oral Flagyl. She appears to be tolerating these both well. This is for osteomyelitis involving the left calcaneus. The area on the right in the setting of bilateral Charcot deformities did not have any bone infection on MRI. We put a total contact cast on her with silver alginate as the  primary dressing and she returns today in follow-up. Per our intake nurse there was too much drainage to leave this on all week therefore we will bring her back for an early change 3/8; follow-up total contact cast she is still having a lot of drainage probably too much drainage from the right foot wound will week with the same cast. She will be back on Thursday. The area on the left looks somewhat better 3/11; patient here for a total contact cast change. 3/15; patient came in for wound care evaluation. She is still on IV vancomycin and oral Flagyl for osteomyelitis in  the left foot. The area on the right foot we are putting in a total contact cast. 3/18 for total contact cast change on the right foot 3/22; the patient was here for wound review today. The area on the right foot is slightly smaller in terms of surface area. However the surface of the wound is very gritty and hyper granulated. More problematically the area on the plantar left foot has increased depth indeed in the center of this it probes to bone. We have been using Hydrofera Blue in both areas. She is on antibiotics as prescribed by infectious disease IV vancomycin and oral Flagyl 3/25; the patient is in for total contact cast change. Our intake nurse was concerned about the amount of drainage which soaked right to the multiple layers we are putting on this. Wondered about options. The wound bed actually looks as healthy as this is looked although there may not be a lot of change in dimensions things are looking a lot better. If we are going to heal this woman's wound which is in the middle of her right Charcot foot this is going to need to be offloaded. There are not many alternatives 3/29; still not a lot of improvement although the surface of the right wound looks better. We have been using Hydrofera Blue 04/17/2019 upon evaluation today patient appears to be doing well with a total contact cast. With actually having to change  this twice a week. She saw [name] for wound care earlier in the week this is for the cast change today. That is due to the fact that she has significant drainage. Overall the wound is been looking excellent however. 4/5; we continue to put a total contact cast on this patient with Hydrofera Blue. Still a lot of drainage which precludes a simple weekly change or changing this twice a week. 4/8; total contact cast changed today. Apparently the wound looks better per our intake nurse 4/12; patient here for wound care evaluation. Wounds on the right foot have not changed in dimension none about a month left foot looks similar. Apparently her antibiotics that we are giving her for osteomyelitis in the left foot complete on Wednesday. We have been using Hydrofera Blue on the right foot under a total contact cast. Nurses report greenish drainage although I think this may be discoloration from the [facility] 4/15; back for cast change wounds look quite good although still moderate amount of drainage. Total contact cast reapplied Objective Constitutional Sitting or standing Blood Pressure is within target range for patient.. Pulse regular and within target range for patient.Marland Kitchen Respirations regular, non-labored and within target range.. Temperature is normal and within the target range for the patient.Marland Kitchen Appears in no distress. Vitals Time Taken: 1:10 PM, Height: 68 in, Weight: 265 lbs, BMI: 40.3, Temperature: 98.3 F, Pulse: 72 bpm, Respiratory Rate: 18 breaths/min, Blood Pressure: 118/45 mmHg. General Notes: Wound exam; the wound looks slightly smaller certainly a better looking surface. Wound edges look better as well. Integumentary (Hair, Skin) Wound #10 status is Open. Original cause of wound was Blister. The wound is located on the Left,Lateral,Plantar Foot. The wound measures 1.5cm length x 1.5cm width x 1.9cm depth; 1.767cm^2 area and 3.358cm^3 volume. There is muscle and Fat Layer  (Subcutaneous Tissue) Exposed exposed. There is no tunneling or undermining noted. There is a medium amount of serosanguineous drainage noted. The wound margin is thickened. There is large (67-100%) red, pink granulation within the wound bed. There is no necrotic tissue  within the wound bed. Wound #9 status is Open. Original cause of wound was Blister. The wound is located on the Right,Medial Foot. The wound measures 3.8cm length x 3cm width x 0.5cm depth; 8.954cm^2 area and 4.477cm^3 volume. There is Fat Layer (Subcutaneous Tissue) Exposed exposed. There is no tunneling or undermining noted. There is a large amount of purulent drainage noted. The wound margin is thickened. There is large (67-100%) red, pink, hyper - granulation within the wound bed. There is no necrotic tissue within the wound bed. 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 There was a Total Contact Cast Procedure by Ricard Dillon., MD. Post procedure Diagnosis Wound #9: Same as Pre-Procedure Plan Follow-up Appointments: Return Appointment in 1 week. - MD visit Return Appointment in: - Thursday or Friday for cast change Dressing Change Frequency: Wound #10 Left,Lateral,Plantar Foot: Change Dressing every other day. Wound #9 Right,Medial Foot: Do not change entire dressing for one week. Wound Cleansing: May shower with  protection. Primary Wound Dressing: Wound #10 Left,Lateral,Plantar Foot: Calcium Alginate with Silver - lightly pack Wound #9 Right,Medial Foot: Calcium Alginate with Silver Secondary Dressing: Wound #10 Left,Lateral,Plantar Foot: Kerlix/Rolled Gauze Dry Gauze Other: - felt callous pad Wound #9 Right,Medial Foot: Kerlix/Rolled Gauze Zetuvit or Kerramax Drawtex Carboflex Off-Loading: Total Contact Cast to Right Lower Extremity Open toe surgical shoe to: - add felt to sandal also 1. Silver alginate to continue 2. Total contact cast reapplied in the standard fashion Electronic Signature(s) Signed: 05/04/2019 6:55:41 AM By: Linton Ham MD Entered By: Linton Ham on 05/02/2019 14:09:49 -------------------------------------------------------------------------------- Total Contact Cast Details Patient Name: Date of Service: AUNIKA, KIRSTEN 05/02/2019 1:00 PM Medical Record [id number] Patient Account Number: [id number] Date of Birth/Sex: Treating RN: 1955/12/31 (63 y.o. Clearnce Sorrel Primary Care Provider: Dewayne Hatch Other Clinician: Referring Provider: Dewayne Hatch Treating Provider/Extender:Clevland Cork, Esperanza Richters in Treatment: 13 Total Contact Cast Applied for Wound Assessment: Wound #9 Right,Medial Foot Performed By: Physician Ricard Dillon., MD Post Procedure Diagnosis Same as Pre-procedure Electronic Signature(s) Signed: 05/03/2019 4:21:34 PM By: Kela Millin Signed: 05/04/2019 6:55:41 AM By: Linton Ham MD Entered By: Kela Millin on 05/02/2019 13:39:11 -------------------------------------------------------------------------------- SuperBill Details Patient Name: Date of Service: [facility] 05/02/2019 Medical Record [id number] Patient Account Number: [id number] Date of Birth/Sex: Treating RN: 04-09-1955 (63 y.o. Helene Shoe, Tammi Klippel Primary Care Provider: Dewayne Hatch Other  Clinician: Referring Provider: Treating Provider/Extender:Geovannie Vilar, [name], [facility] Weeks in Treatment: 13 Diagnosis Coding ICD-10 Codes Code Description E11.621 Type 2 diabetes mellitus with foot ulcer L97.524 Non-pressure chronic ulcer of other part of left foot with necrosis of bone L97.511 Non-pressure chronic ulcer of other part of right foot limited to breakdown of skin L97.514 Non-pressure chronic ulcer of other part of right foot with necrosis of bone E11.42 Type 2 diabetes mellitus with diabetic polyneuropathy M14.671 Charcot's joint, right ankle and foot M14.672 Charcot's joint, left ankle and foot 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, left ankle and foot Facility Procedures CPT4 Code Description:  [id number] - APPLY TOTAL CONTACT LEG CAST ICD-10 Diagnosis Description L97.511 Non-pressure chronic ulcer of other part of right foot li E11.621 Type 2 diabetes mellitus with foot ulcer Modifier: mited to brea Quantity: 1 kdown of skin Physician Procedures CPT4 Code Description: [id number] - WC PHYS APPLY TOTAL CONTACT CAST ICD-10 Diagnosis Description L97.511 Non-pressure chronic ulcer of other part of right foot limite E11.621 Type 2 diabetes mellitus with foot ulcer Modifier: d to breakdown Quantity: 1 of skin Electronic Signature(s) Signed: 05/04/2019 6:55:41 AM By: Baltazar Najjar MD Entered By: Baltazar Najjar on 05/02/2019 14:10:18

## 2019-05-06 ENCOUNTER — Other Ambulatory Visit: Payer: Self-pay

## 2019-05-06 ENCOUNTER — Encounter (HOSPITAL_BASED_OUTPATIENT_CLINIC_OR_DEPARTMENT_OTHER): Payer: Medicare Other | Admitting: Internal Medicine

## 2019-05-06 ENCOUNTER — Telehealth: Payer: Self-pay

## 2019-05-06 ENCOUNTER — Other Ambulatory Visit: Payer: Self-pay | Admitting: Internal Medicine

## 2019-05-06 DIAGNOSIS — E11621 Type 2 diabetes mellitus with foot ulcer: Secondary | ICD-10-CM | POA: Diagnosis not present

## 2019-05-06 DIAGNOSIS — M86072 Acute hematogenous osteomyelitis, left ankle and foot: Secondary | ICD-10-CM

## 2019-05-06 MED ORDER — METRONIDAZOLE 500 MG PO TABS: 500.0000 mg | ORAL_TABLET | Freq: Three times a day (TID) | ORAL | 1 refills | Status: DC

## 2019-05-06 NOTE — Telephone Encounter (Signed)
I ordered refills. 

## 2019-05-06 NOTE — Telephone Encounter (Signed)
I spoke with Ms Rosch , PICC is still in place.  Orderrs called to [facility] , they will restart IV medication( Ceftriaxone) and send to pateint's home today.   [facility] will inform [facility] health nurse.   Laurell Josephs, RN

## 2019-05-06 NOTE — Telephone Encounter (Signed)
Patient called requesting refill on her oral Flagyl. Stated she won't have enough of her current prescription to make it until her follow up appointment with Dr. Orvan Falconer. Will forward to provider for advice.  Malaya Cagley Loyola Mast, RN

## 2019-05-06 NOTE — Telephone Encounter (Signed)
I left message for patient to call our office. I need to know if she still has PICC?  Antibiotics were completed on 05-01-2019.  [facility] was going out today to pull PICC per pharmacy.    I will await a return call from patient.   Laurell Josephs, RN

## 2019-05-06 NOTE — Progress Notes (Addendum)
Sarah, [name] ([id number]) Visit Report for 05/06/2019 HPI Details Patient Name: Date of Service: [name] 05/06/2019 12:30 PM Medical Record [id number] Patient Account Number: [id number] Date of Birth/Sex: Treating RN: Apr 14, 1955 (64 y.o. [name] Primary Care Provider: Dewayne Hatch Other Clinician: Referring Provider: Treating Provider/Extender:Emmry Hinsch, [name], [facility] Weeks in Treatment: 14 History of Present Illness HPI Description: 64 yrs old bf here for follow up recurrent left charcot foot ulcer. She has DM. She is not a smoker. She states a blister developed 3 months ago at site of previous breakdown from 1 year ago. It was debrided at the podiatrist's office. She went to the ER and then sent here. She denies pain. Xray was negative for osteo. Culture from this clinic negative. 09/08/14 [name] to [name] for Charcot foot. Seen by him and MRI scheduled 09/19/14. Prior silver collagen but this was not ordered last visit, patient has been rinsing with saline alone. Re institute silver collagen every other day. F/u 2 weeks with Dr. Dellia Nims as Labor Day holiday. Supplies ordered. 09/25/14; this is a patient with a Charcot foot on the left. she has a wound over the plantar aspect of her left calcaneus. She has been seen by [name] of orthopedic surgery and has had an MRI. She is apparently going within the next week or 2 for corrective surgery which will involve an external fixator. I don't have any of the details of this. 10/06/14; The patient is a 64 yrs old bf with a left Charcot foot and ulcer on the plantar. 12/01/14 Returns post surgery with [name]. Has follow up visit later this week with him, currently in facility and NWB over this extremity. States no drainage from foot and no current wound care. On exam callus and scab in place, suspect healed. Has external fixator in place. Will defer to [name]  for management, ok to place moisturizing cream over scabs and callus and she will call if she requires follow up here. READMISSION 01/28/2019 Patient was in the clinic in 2016 for a wound on the plantar aspect of her left calcaneus. Predominantly cared for by Dr. [name] she was referred to [name] and had corrective surgery for the position of her left ankle I believe. She had previously been here in 2015 and then prior to that in 2011 2012 that I do not have these records. More recently she has developed fairly extensive wounds on the right medial foot, left lateral foot and a small area on the right medial great toe. Right medial foot wound has been there for 6 to 7 months, left foot wound for 2 to 3 months and the right great toe only over the last few weeks. She has been followed by [name] at Dr. [name] office it sounds as though she has been undergoing callus paring and silver nitrate. The patient has been washing these off with soap and water and plan applying dry dressing Past medical history, type 2 diabetes well controlled with a recent hemoglobin A1c of 7 on 11/16, type 2 diabetes with peripheral neuropathy, previous left Charcot joint surgery bilateral Charcot joints currently, stage III chronic renal failure, hypertension, obstructive sleep apnea, history of MRSA and gastroesophageal reflux. 1/18; this is a patient with bilateral Charcot feet. Plain x-rays that I did of both of these wounds that are either at bone or precariously close to bone were done. On the right foot this showed possible lytic destruction involving the anterior aspect  of the talus which may represent a wound osteomyelitis. An MRI was recommended. On the left there was no definite lytic destruction although the wound is deep undermining's and I think probably requires an MRI on this basis in any case 1/25; patient was supposed to have her MRI last Friday of her bilateral feet however they would not  do it because there was silver alginate in her dressings, will replace with calcium alginate today and will see if we can get her done today 2/1; patient did not get her MRIs done last week because of issues with the MRI department receiving orders to do bilateral feet. She has 2 deep wounds in the setting of Charcot feet deformities bilaterally. Both of them at one point had exposed bone although I had trouble demonstrating that today. I am not sure that we can offload these very aggressively as I watched her leave the clinic last week her gait is very narrow and unsteady. 2/15; the MRI of her right foot did not show osteomyelitis potentially osteonecrosis of the talus. 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 The wounds are a large wound on the right medial foot and on the left a substantial wound on the left lateral plantar calcaneus. We have been using silver alginate. Ultimately I think we need to consider a total contact cast on the right while we work through the possibility of osteomyelitis in the left. I watched her walk out with her crutches I have some trepidation about the ability to walk with the cast on her right foot. Nevertheless with her Charcot deformities I do not think there is a way to heal these short of offloading them aggressively. 2/22; the culture of the bone in the left foot that I did last week showed a few Citrobacter Koseri as well as some streptococcal species. In my attempted bone for pathology did not actually yield a lot of bone the pathologist did not identify any osteomyelitis. 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 Is promised today and aggressive debridement of the right foot and a total contact cast which surprisingly she seemed to tolerate quite well 03/13/2019  upon evaluation today patient appears to be doing well with her total contact cast she is here for the first cast change. She had no areas of rubbing or any other complications at this point. Overall things are doing quite well. 2/26; the patient was kindly seen by Dr. Orvan Falconer of infectious disease on 2/25. She is now on IV vancomycin PICC line placement in 2 days and oral Flagyl. This was in response to her MRI showing osteomyelitis of the left calcaneus concerning for osteomyelitis. We have been putting her right leg in a total contact cast. Our nurses report drainage. She is tolerating the total contact cast well. 3/4; the patient is started her IV antibiotics and is taking IV vancomycin and oral Flagyl. She appears to be tolerating these both well. This is for osteomyelitis involving the left calcaneus. The area on the right in the setting of bilateral Charcot deformities did not have any bone infection on MRI. We put a total contact cast on her with silver alginate as the primary dressing and she returns today in follow-up. Per our intake nurse there was too much drainage to leave this on all week therefore  we will bring her back for an early change 3/8; follow-up total contact cast she is still having a lot of drainage probably too much drainage from the right foot wound will week with the same cast. She will be back on Thursday. The area on the left looks somewhat better 3/11; patient here for a total contact cast change. 3/15; patient came in for wound care evaluation. She is still on IV vancomycin and oral Flagyl for osteomyelitis in the left foot. The area on the right foot we are putting in a total contact cast. 3/18 for total contact cast change on the right foot 3/22; the patient was here for wound review today. The area on the right foot is slightly smaller in terms of surface area. However the surface of the wound is very gritty and hyper granulated. More problematically the area  on the plantar left foot has increased depth indeed in the center of this it probes to bone. We have been using Hydrofera Blue in both areas. She is on antibiotics as prescribed by infectious disease IV vancomycin and oral Flagyl 3/25; the patient is in for total contact cast change. Our intake nurse was concerned about the amount of drainage which soaked right to the multiple layers we are putting on this. Wondered about options. The wound bed actually looks as healthy as this is looked although there may not be a lot of change in dimensions things are looking a lot better. If we are going to heal this woman's wound which is in the middle of her right Charcot foot this is going to need to be offloaded. There are not many alternatives 3/29; still not a lot of improvement although the surface of the right wound looks better. We have been using Hydrofera Blue 04/17/2019 upon evaluation today patient appears to be doing well with a total contact cast. With actually having to change this twice a week. She saw [name] for wound care earlier in the week this is for the cast change today. That is due to the fact that she has significant drainage. Overall the wound is been looking excellent however. 4/5; we continue to put a total contact cast on this patient with Hydrofera Blue. Still a lot of drainage which precludes a simple weekly change or changing this twice a week. 4/8; total contact cast changed today. Apparently the wound looks better per our intake nurse 4/12; patient here for wound care evaluation. Wounds on the right foot have not changed in dimension none about a month left foot looks similar. Apparently her antibiotics that we are giving her for osteomyelitis in the left foot complete on Wednesday. We have been using Hydrofera Blue on the right foot under a total contact cast. Nurses report greenish drainage although I think this may be discoloration from the [facility] 4/15; back for  cast change wounds look quite good although still moderate amount of drainage. Total contact cast reapplied 4/19;. Wound evaluation. Wound on the right foot is smaller surface looks healthy. There is still the divot in the middle part of this wound but I could not feel any bone. Area on the left foot also looks better She has completed her antibiotics but still has the PICC line in. Electronic Signature(s) Signed: 05/06/2019 5:34:04 PM By: Baltazar Najjar MD Entered By: Baltazar Najjar on 05/06/2019 14:03:45 -------------------------------------------------------------------------------- Physical Exam Details Patient Name: Date of Service: [name]. 05/06/2019 12:30 PM Medical Record [id number] Patient Account Number: [id number] Date of Birth/Sex: Treating  RN: [birth year] (64 y.o. [name] Primary Care Provider: Chevis Pretty Other Clinician: Referring Provider: Treating Provider/Extender:Iolanda Folson, [name], [facility] Weeks in Treatment: 14 Constitutional Sitting or standing Blood Pressure is within target range for patient.. Pulse regular and within target range for patient.Marland Kitchen Respirations regular, non-labored and within target range.. Temperature is normal and within the target range for the patient.Marland Kitchen Appears in no distress. Notes Wound exam; the wound looks slightly smaller on the right. Surface looks better. Some senescence to the wound edges which I may need to address yet again. The left foot wound looks smaller there appears to be less depth. Electronic Signature(s) Signed: 05/06/2019 5:34:04 PM By: Baltazar Najjar MD Entered By: Baltazar Najjar on 05/06/2019 13:48:21 -------------------------------------------------------------------------------- Physician Orders Details Patient Name: Date of Service: [name]. 05/06/2019 12:30 PM Medical Record [id number] Patient Account Number: [id number] Date of Birth/Sex: Treating  RN: December 05, 1955 (63 y.o. [name] Primary Care Provider: Chevis Pretty Other Clinician: Referring Provider: Chevis Pretty Treating Provider/Extender:Kailo Kosik, Lamar Sprinkles in Treatment: 14 Verbal / Phone Orders: No Diagnosis Coding ICD-10 Coding Code Description E11.621 Type 2 diabetes mellitus with foot ulcer L97.524 Non-pressure chronic ulcer of other part of left foot with necrosis of bone L97.511 Non-pressure chronic ulcer of other part of right foot limited to breakdown of skin L97.514 Non-pressure chronic ulcer of other part of right foot with necrosis of bone E11.42 Type 2 diabetes mellitus with diabetic polyneuropathy M14.671 Charcot's joint, right ankle and foot M14.672 Charcot's joint, left ankle and foot 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 - MD visit Return Appointment in: - Thursday or Friday for cast change Dressing Change Frequency Wound #10 Left,Lateral,Plantar Foot Change Dressing every other day. Wound #9 Right,Medial Foot Do not change entire dressing for one week. Wound Cleansing May shower with protection. Primary Wound Dressing Wound #10 Left,Lateral,Plantar Foot Calcium Alginate with Silver Wound #9 Right,Medial Foot Calcium Alginate with Silver Secondary Dressing Wound #10 Left,Lateral,Plantar Foot Kerlix/Rolled Gauze Dry Gauze Other: - felt callous pad Wound #9 Right,Medial Foot Kerlix/Rolled Gauze Zetuvit or Kerramax Drawtex Carboflex Off-Loading Total Contact Cast to Right Lower Extremity Open toe surgical shoe to: - add felt to sandal also Electronic Signature(s) Signed: 05/06/2019 5:34:04 PM By: Baltazar Najjar MD Signed: 05/06/2019 6:02:31 PM By: Zandra Abts RN, BSN Entered By: Zandra Abts on 05/06/2019 13:31:57 -------------------------------------------------------------------------------- Problem List Details Patient Name: Date of  Service: [name]. 05/06/2019 12:30 PM Medical Record [id number] Patient Account Number: [id number] Date of Birth/Sex: Treating RN: Sep 09, 1955 (64 y.o. [name] Primary Care Provider: Chevis Pretty Other Clinician: Referring Provider: Treating Provider/Extender:Sarye Kath, [name], [facility] Weeks in Treatment: 63 Active Problems ICD-10 Evaluated Encounter Code Description Active Date Today Diagnosis E11.621 Type 2 diabetes mellitus with foot ulcer 01/28/2019 No Yes L97.524 Non-pressure chronic ulcer of other part of left foot 01/28/2019 No Yes with necrosis of bone L97.511 Non-pressure chronic ulcer of other part of right foot 01/28/2019 No Yes limited to breakdown of skin L97.514 Non-pressure chronic ulcer of other part of right foot 01/28/2019 No Yes with necrosis of bone E11.42 Type 2 diabetes mellitus with diabetic polyneuropathy 01/28/2019 No Yes M14.671 Charcot's joint, right ankle and foot 01/28/2019 No Yes M14.672 Charcot's joint, left ankle and foot 01/28/2019 No Yes 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 [name] Primary Care Provider: Chevis Pretty Other Clinician: Referring Provider: Treating Provider/Extender:Maahi Lannan, [name], [facility] Weeks in Treatment: 14 Subjective History of Present Illness (HPI) 64 yrs old bf here for follow up recurrent left charcot foot ulcer. She has DM. She is not a smoker. She states  a blister developed 3 months ago at site of previous breakdown from 1 year ago. It was debrided at the podiatrist's office. She went to the ER and then sent here. She denies pain. Xray was negative for osteo. Culture from this clinic negative. 09/08/14 Referred by [name] to [name] for Charcot foot. Seen by him and MRI scheduled 09/19/14. Prior silver collagen but this was not ordered last visit, patient has been rinsing with saline alone. Re institute silver collagen every other day. F/u 2 weeks with [name] as Labor Day holiday. Supplies ordered. 09/25/14; this is a patient with a Charcot foot on the left. she has a wound over the plantar aspect of her left calcaneus. She has been seen by [name] of orthopedic surgery and has had an MRI. She is apparently going within the next week or 2 for corrective surgery which will involve an external fixator. I don't have any of the details of this. 10/06/14; The patient is a 64 yrs old bf with a left Charcot foot and ulcer on the plantar. 12/01/14 Returns post surgery with [name]. Has follow up visit later this week with him, currently in facility and NWB over this extremity. States no drainage from foot and no current wound care. On exam callus and scab in place, suspect healed. Has external fixator in place. Will defer to [name] for management, ok to place moisturizing cream over scabs and callus and she will call if she requires follow up here. READMISSION 01/28/2019 Patient was in the clinic in 2016 for a wound on the plantar aspect of her left calcaneus. Predominantly cared for by [name] she was referred to [name] and had corrective surgery for the position of her left ankle I believe. She had previously been here in 2015 and then prior to that in 2011 2012 that I do not have these records. More recently she has developed fairly extensive wounds on the right medial foot, left lateral foot and a small area on the right  medial great toe. Right medial foot wound has been there for 6 to 7 months, left foot wound for 2 to 3 months and the right great toe only over the last few weeks. She has been followed by [name] at Dr. Laverta [place] office it sounds as though she has been undergoing callus paring and silver nitrate. The patient has been washing these off with soap and water and plan applying dry dressing Past medical history, type 2 diabetes well controlled with a recent hemoglobin A1c of 7 on 11/16, type 2 diabetes with peripheral neuropathy, previous left Charcot joint surgery bilateral Charcot joints currently, stage III chronic renal failure, hypertension, obstructive sleep apnea, history of MRSA and gastroesophageal reflux. 1/18; this is a patient with bilateral Charcot feet. Plain x-rays that I did of both of these wounds that are either at bone or precariously close to bone were done. On the right foot this showed possible lytic destruction involving the anterior aspect of the talus which may represent a wound  osteomyelitis. An MRI was recommended. On the left there was no definite lytic destruction although the wound is deep undermining's and I think probably requires an MRI on this basis in any case 1/25; patient was supposed to have her MRI last Friday of her bilateral feet however they would not do it because there was silver alginate in her dressings, will replace with calcium alginate today and will see if we can get her done today 2/1; patient did not get her MRIs done last week because of issues with the MRI department receiving orders to do bilateral feet. She has 2 deep wounds in the setting of Charcot feet deformities bilaterally. Both of them at one point had exposed bone although I had trouble demonstrating that today. I am not sure that we can offload these very aggressively as I watched her leave the clinic last week her gait is very narrow and unsteady. 2/15; the MRI of her right foot did  not show osteomyelitis potentially osteonecrosis of the talus. 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 The wounds are a large wound on the right medial foot and on the left a substantial wound on the left lateral plantar calcaneus. We have been using silver alginate. Ultimately I think we need to consider a total contact cast on the right while we work through the possibility of osteomyelitis in the left. I watched her walk out with her crutches I have some trepidation about the ability to walk with the cast on her right foot. Nevertheless with her Charcot deformities I do not think there is a way to heal these short of offloading them aggressively. 2/22; the culture of the bone in the left foot that I did last week showed a few Citrobacter Koseri as well as some streptococcal species. In my attempted bone for pathology did not actually yield a lot of bone the pathologist did not identify any osteomyelitis. 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 Is promised today and aggressive debridement of the right foot and a total contact cast which surprisingly she seemed to tolerate quite well 03/13/2019 upon evaluation today patient appears to be doing well with her total contact cast she is here for the first cast change. She had no areas of rubbing or any other complications at this point. Overall things are doing quite well. 2/26; the patient was kindly seen by Dr. Orvan Falconer of infectious disease on 2/25. She is now on IV vancomycin PICC line placement in 2 days and oral Flagyl. This was in response to her MRI showing osteomyelitis of the left calcaneus concerning for osteomyelitis. We have been putting her right leg in a total contact cast. Our nurses report drainage. She is tolerating the total contact  cast well. 3/4; the patient is started her IV antibiotics and is taking IV vancomycin and oral Flagyl. She appears to be tolerating these both well. This is for osteomyelitis involving the left calcaneus. The area on the right in the setting of bilateral Charcot deformities did not have any bone infection on MRI. We put a total contact cast on her with silver alginate as the primary dressing and she returns today in follow-up. Per our intake nurse there was too much drainage to leave this on all week therefore we will bring her back for an early  change 3/8; follow-up total contact cast she is still having a lot of drainage probably too much drainage from the right foot wound will week with the same cast. She will be back on Thursday. The area on the left looks somewhat better 3/11; patient here for a total contact cast change. 3/15; patient came in for wound care evaluation. She is still on IV vancomycin and oral Flagyl for osteomyelitis in the left foot. The area on the right foot we are putting in a total contact cast. 3/18 for total contact cast change on the right foot 3/22; the patient was here for wound review today. The area on the right foot is slightly smaller in terms of surface area. However the surface of the wound is very gritty and hyper granulated. More problematically the area on the plantar left foot has increased depth indeed in the center of this it probes to bone. We have been using Hydrofera Blue in both areas. She is on antibiotics as prescribed by infectious disease IV vancomycin and oral Flagyl 3/25; the patient is in for total contact cast change. Our intake nurse was concerned about the amount of drainage which soaked right to the multiple layers we are putting on this. Wondered about options. The wound bed actually looks as healthy as this is looked although there may not be a lot of change in dimensions things are looking a lot better. If we are going to heal this  woman's wound which is in the middle of her right Charcot foot this is going to need to be offloaded. There are not many alternatives 3/29; still not a lot of improvement although the surface of the right wound looks better. We have been using Hydrofera Blue 04/17/2019 upon evaluation today patient appears to be doing well with a total contact cast. With actually having to change this twice a week. She saw [name] for wound care earlier in the week this is for the cast change today. That is due to the fact that she has significant drainage. Overall the wound is been looking excellent however. 4/5; we continue to put a total contact cast on this patient with Hydrofera Blue. Still a lot of drainage which precludes a simple weekly change or changing this twice a week. 4/8; total contact cast changed today. Apparently the wound looks better per our intake nurse 4/12; patient here for wound care evaluation. Wounds on the right foot have not changed in dimension none about a month left foot looks similar. Apparently her antibiotics that we are giving her for osteomyelitis in the left foot complete on Wednesday. We have been using Hydrofera Blue on the right foot under a total contact cast. Nurses report greenish drainage although I think this may be discoloration from the [facility] 4/15; back for cast change wounds look quite good although still moderate amount of drainage. Total contact cast reapplied 4/19;. Wound evaluation. Wound on the right foot is smaller surface looks healthy. There is still the divot in the middle part of this wound but I could not feel any bone. Area on the left foot also looks better She has completed her antibiotics but still has the PICC line in. Objective Constitutional Sitting or standing Blood Pressure is within target range for patient.. Pulse regular and within target range for patient.Marland Kitchen Respirations regular, non-labored and within target range.. Temperature  is normal and within the target range for the patient.Marland Kitchen Appears in no distress. Vitals Time Taken: 12:57 PM, Height: 68 in,  Weight: 265 lbs, BMI: 40.3, Temperature: 98.1 F, Pulse: 70 bpm, Respiratory Rate: 18 breaths/min, Blood Pressure: 138/63 mmHg. General Notes: Wound exam; the wound looks slightly smaller on the right. Surface looks better. Some senescence to the wound edges which I may need to address yet again. ooThe left foot wound looks smaller there appears to be less depth. Integumentary (Hair, Skin) Wound #10 status is Open. Original cause of wound was Blister. The wound is located on the Left,Lateral,Plantar Foot. The wound measures 1.3cm length x 1.4cm width x 1.5cm depth; 1.429cm^2 area and 2.144cm^3 volume. There is muscle and Fat Layer (Subcutaneous Tissue) Exposed exposed. There is no tunneling or undermining noted. There is a medium amount of serosanguineous drainage noted. The wound margin is thickened. There is large (67-100%) red, pink granulation within the wound bed. There is no necrotic tissue within the wound bed. Wound #9 status is Open. Original cause of wound was Blister. The wound is located on the Right,Medial Foot. The wound measures 3.6cm length x 2.6cm width x 0.3cm depth; 7.351cm^2 area and 2.205cm^3 volume. There is Fat Layer (Subcutaneous Tissue) Exposed exposed. There is no tunneling noted, however, there is undermining starting at 7:00 and ending at 9:00 with a maximum distance of 0.4cm. There is a large amount of serosanguineous drainage noted. The wound margin is thickened. There is large (67-100%) red, pink, hyper - granulation within the wound bed. There is no necrotic tissue within the wound bed. 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 There was a Total Contact Cast Procedure by [name], MD. Post procedure Diagnosis Wound #9: Same as Pre-Procedure Plan Follow-up Appointments: Return Appointment in 1 week. - MD visit Return Appointment in: - Thursday or Friday for cast change Dressing Change Frequency: Wound #10 Left,Lateral,Plantar Foot: Change Dressing every other day. Wound #9 Right,Medial Foot: Do not change entire dressing for one week. Wound Cleansing: May shower with protection. Primary Wound Dressing: Wound #10 Left,Lateral,Plantar Foot: Calcium Alginate with Silver Wound #9 Right,Medial Foot: Calcium Alginate with Silver Secondary Dressing: Wound #10 Left,Lateral,Plantar Foot: Kerlix/Rolled Gauze Dry Gauze Other: - felt callous pad Wound #9 Right,Medial Foot: Kerlix/Rolled Gauze Zetuvit or Kerramax Drawtex Carboflex Off-Loading: Total Contact Cast to Right Lower Extremity Open toe surgical shoe to: - add felt to sandal also 1. Continue his use silver alginate to both wound areas 2. Right foot still in a cast 3. May need debridement of the senescent edges next time we will see. As noted the surface of the wound looks better. The area on the left also looks better with less depth Electronic Signature(s) Signed: 05/07/2019 5:47:38 PM By: Baltazar Najjar MD Signed: 05/07/2019 6:13:01 PM By: Zandra Abts RN, BSN Previous Signature: 05/06/2019 5:34:04 PM Version By: Baltazar Najjar MD Entered By: Zandra Abts on 05/07/2019  09:46:33 -------------------------------------------------------------------------------- Total Contact Cast Details Patient Name: Date of Service: Sarah, [name] 05/06/2019 12:30 PM Medical Record [id number] Patient Account Number: [id number] Date of Birth/Sex: Treating RN:  Apr 22, 1955 (64 y.o. [name] Primary Care Provider: Chevis Pretty Other Clinician: Referring Provider: Chevis Pretty Treating Provider/Extender:Lizmary Nader, Lamar Sprinkles in Treatment: 14 Total Contact Cast Applied for Wound Assessment: Wound #9 Right,Medial Foot Performed By: Physician [name], MD Post Procedure Diagnosis Same as Pre-procedure Electronic Signature(s) Signed: 05/06/2019 5:34:04 PM By: Baltazar Najjar MD Entered By: Baltazar Najjar on 05/06/2019 14:02:42 -------------------------------------------------------------------------------- SuperBill Details Patient Name: Date of Service: [name] 05/06/2019 Medical Record [id number] Patient Account Number: [id number] Date of Birth/Sex: Treating RN: 05-16-1955 (63 y.o. [name] Primary Care Provider: Chevis Pretty Other Clinician: Referring Provider: Treating Provider/Extender:Amonie Wisser, [name], [facility] Weeks in Treatment: 14 Diagnosis Coding ICD-10 Codes Code Description E11.621 Type 2 diabetes mellitus with foot ulcer L97.524 Non-pressure chronic ulcer of other part of left foot with necrosis of bone L97.511 Non-pressure chronic ulcer of other part of right foot limited to breakdown of skin L97.514 Non-pressure chronic ulcer of other part of right foot with necrosis of bone E11.42 Type 2 diabetes mellitus with diabetic polyneuropathy M14.671 Charcot's joint, right ankle and foot M14.672 Charcot's joint, left ankle and foot 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, left ankle and foot Facility Procedures CPT4 Code Description: 57017793 29445 - APPLY TOTAL CONTACT LEG  CAST ICD-10 Diagnosis Description L97.524 Non-pressure chronic ulcer of other part of left foot wit Modifier: h necrosis of Quantity: 1 bone Physician Procedures CPT4 Code Description: 9030092 29445 - WC PHYS APPLY TOTAL CONTACT CAST ICD-10 Diagnosis Description L97.524 Non-pressure chronic ulcer of other part of left foot with Modifier: necrosis of b Quantity: 1 one Electronic Signature(s) Signed: 05/06/2019 5:34:04 PM By: Baltazar Najjar MD Entered By: Baltazar Najjar on 05/06/2019 14:03:13

## 2019-05-06 NOTE — Telephone Encounter (Signed)
She can extend her IV ceftriaxone until her upcoming appointment on 05/15/2019.

## 2019-05-07 ENCOUNTER — Telehealth: Payer: Self-pay

## 2019-05-07 NOTE — Progress Notes (Addendum)
[name], [name] ([id number]) Visit Report for 05/06/2019 Arrival Information Details Patient Name: Date of Service: [name], [name] 05/06/2019 12:30 PM Medical Record [id number] Patient Account Number: [id number] Date of Birth/Sex: Treating RN: 1955/10/08 (64 y.o. [name] Primary Care Jules Vidovich: Dewayne Hatch Other Clinician: Referring Patrich Heinze: Treating Erskine Steinfeldt/Extender:Robson, [name], [facility] Weeks in Treatment: 14 Visit Information History Since Last Visit Added or deleted any medications: No Patient Arrived: Crutches Any new allergies or adverse reactions: No Arrival Time: 12:57 Had a fall or experienced change in No Accompanied By: self activities of daily living that may affect Transfer Assistance: None risk of falls: Patient Identification Verified: Yes Signs or symptoms of abuse/neglect since last No Secondary Verification Process Completed: Yes visito Patient Requires Transmission-Based No Hospitalized since last visit: No Precautions: Implantable device outside of the clinic excluding No Patient Has Alerts: No cellular tissue based products placed in the center since last visit: Has Dressing in Place as Prescribed: Yes Pain Present Now: No Electronic Signature(s) Signed: 05/07/2019 1:29:28 PM By: Sandre Kitty Entered By: Sandre Kitty on 05/06/2019 12:57:52 -------------------------------------------------------------------------------- Complex / Palliative Patient Assessment Details Patient Name: Date of Service: [name], [name] 05/06/2019 12:30 PM Medical Record [id number] Patient Account Number: [id number] Date of Birth/Sex: Treating RN: 1955/04/19 (64 y.o. [name] Primary Care Andrik Sandt: Dewayne Hatch Other Clinician: Referring Garrette Caine: Treating Eilyn Polack/Extender:Robson, [name], [facility] Weeks in Treatment: 14 Palliative Management Criteria Complex Wound Management  Criteria Patient has remarkable or complex co-morbidities requiring medications or treatments that extend wound healing times. Examples: Diabetes mellitus with chronic renal failure or end stage renal disease requiring dialysis Advanced or poorly controlled rheumatoid arthritis Diabetes mellitus and end stage chronic obstructive pulmonary disease Active cancer with current chemo- or radiation therapy Type 2 Diabetes, Osteomyelitis, Charcot foot, Sleep Apnea, CKD stg 3 Care Approach Wound Care Plan: Complex Wound Management Electronic Signature(s) Signed: 05/08/2019 11:18:43 AM By: Levan Hurst RN, BSN Signed: 05/10/2019 5:22:40 PM By: Linton Ham MD Entered By: Levan Hurst on 05/08/2019 09:30:16 -------------------------------------------------------------------------------- Encounter Discharge Information Details Patient Name: Date of Service: Sarah Libra. 05/06/2019 12:30 PM Medical Record [id number] Patient Account Number: [id number] Date of Birth/Sex: Treating RN: 12-02-1955 (63 y.o. [name] Primary Care Samia Kukla: Dewayne Hatch Other Clinician: Referring Kensly Bowmer: Treating Melondy Blanchard/Extender:Robson, [name], [facility] Weeks in Treatment: 14 Encounter Discharge Information Items Discharge Condition: Stable Ambulatory Status: Crutches Discharge Destination: Home Transportation: Private Auto Accompanied By: self Schedule Follow-up Appointment: Yes Clinical Summary of Care: Electronic Signature(s) Signed: 05/06/2019 5:42:42 PM By: Deon Pilling Entered By: Deon Pilling on 05/06/2019 14:02:38 -------------------------------------------------------------------------------- Lower Extremity Assessment Details Patient Name: Date of Service: [name], [name] 05/06/2019 12:30 PM Medical Record [id number] Patient Account Number: [id number] Date of Birth/Sex: Treating RN: March 19, 1955 (63 y.o. [name] Primary Care  Jomel Whittlesey: Dewayne Hatch Other Clinician: Referring Rabab Currington: Treating Emmerson Taddei/Extender:Robson, [name], [facility] Weeks in Treatment: 14 Edema Assessment Assessed: [Left: Yes] [Right: Yes] Edema: [Left: Yes] [Right: Yes] Calf Left: Right: Point of Measurement: 43 cm From Medial Instep 38.5 cm 37 cm Ankle Left: Right: Point of Measurement: 12 cm From Medial Instep 24 cm 28 cm Electronic Signature(s) Signed: 05/06/2019 5:42:42 PM By: Deon Pilling Signed: 05/06/2019 6:02:31 PM By: Levan Hurst RN, BSN Entered By: Deon Pilling on 05/06/2019 13:14:51 -------------------------------------------------------------------------------- Multi Wound Chart Details Patient Name: Date of Service: Sarah Libra. 05/06/2019 12:30 PM Medical Record [id number] Patient Account Number: [id number] Date of Birth/Sex: Treating RN: 1956/01/17 (64 y.o. [name] Primary Care Armida Vickroy: Dewayne Hatch Other Clinician:  Referring Ayahna Solazzo: Treating Jaquarius Seder/Extender:Robson, [name], [facility] Weeks in Treatment: 14 Vital Signs Height(in): 68 Pulse(bpm): 70 Weight(lbs): 265 Blood Pressure(mmHg): 138/63 Body Mass Index(BMI): 40 Temperature(F): 98.1 Respiratory 18 Rate(breaths/min): Photos: [10:No Photos] [9:No Photos] [N/A:N/A] Wound Location: [10:Left, Lateral, Plantar Foot Right, Medial Foot] [N/A:N/A] Wounding Event: [10:Blister] [9:Blister] [N/A:N/A] Primary Etiology: [10:Diabetic Wound/Ulcer of the Diabetic Wound/Ulcer of the N/A Lower Extremity] [9:Lower Extremity] Comorbid History: [10:Glaucoma, Chronic sinus Glaucoma, Chronic sinus N/A problems/congestion, Anemia, Sleep Apnea, Hypertension, Type II Diabetes, Rheumatoid Arthritis, Neuropathy] [9:problems/congestion, Anemia, Sleep Apnea, Hypertension, Type II  Diabetes, Rheumatoid Arthritis, Neuropathy] Date Acquired: [10:11/18/2018] [9:06/18/2018] [N/A:N/A] Weeks of Treatment: [10:14]  [9:14] [N/A:N/A] Wound Status: [10:Open] [9:Open] [N/A:N/A] Measurements L x W x D 1.3x1.4x1.5 [9:3.6x2.6x0.3] [N/A:N/A] (cm) Area (cm) : [10:1.429] [9:7.351] [N/A:N/A] Volume (cm) : [10:2.144] [9:2.205] [N/A:N/A] % Reduction in Area: [10:18.80%] [9:33.10%] [N/A:N/A] % Reduction in Volume: [10:18.80%] [9:77.70%] [N/A:N/A] Starting Position 1 [9:7] (o'clock): Ending Position 1 [9:9] (o'clock): Maximum Distance 1 [9:0.4] (cm): Undermining: [10:No] [9:Yes] [N/A:N/A] Classification: [10:Grade 2] [9:Grade 2] [N/A:N/A] Exudate Amount: [10:Medium] [9:Large] [N/A:N/A] Exudate Type: [10:Serosanguineous] [9:Serosanguineous] [N/A:N/A] Exudate Color: [10:red, brown] [9:red, brown] [N/A:N/A] Wound Margin: [10:Thickened] [9:Thickened] [N/A:N/A] Granulation Amount: [10:Large (67-100%)] [9:Large (67-100%)] [N/A:N/A] Granulation Quality: [10:Red, Pink] [9:Red, Pink, Hyper- granulation] [N/A:N/A] Necrotic Amount: [10:None Present (0%)] [9:None Present (0%)] [N/A:N/A] Exposed Structures: [10:Fat Layer (Subcutaneous Tissue) Exposed: Yes Muscle: Yes Fascia: No Tendon: No Joint: No Bone: No None] [9:Fat Layer (Subcutaneous Tissue) Exposed: Yes Fascia: No Tendon: No Muscle: No Joint: No Bone: No Small (1-33%)] [N/A:N/A N/A] Treatment Notes Electronic Signature(s) Signed: 05/06/2019 5:34:04 PM By: Baltazar Najjar MD Signed: 05/06/2019 6:02:31 PM By: Zandra Abts RN, BSN Entered By: Baltazar Najjar on 05/06/2019 13:46:04 -------------------------------------------------------------------------------- Multi-Disciplinary Care Plan Details Patient Name: Date of Service: [name]. 05/06/2019 12:30 PM Medical Record [id number] Patient Account Number: [id number] Date of Birth/Sex: Treating RN: 01/29/1955 (64 y.o. [name] Primary Care Maelle Sheaffer: Chevis Pretty Other Clinician: Referring Napolean Sia: Treating Raymond Azure/Extender:Robson, [name], [facility] Weeks in  Treatment: 14 Active Inactive Wound/Skin Impairment Nursing Diagnoses: Impaired tissue integrity Knowledge deficit related to ulceration/compromised skin integrity Goals: Patient/caregiver will verbalize understanding of skin care regimen Date Initiated: 01/28/2019 Target Resolution Date: 05/16/2019 Goal Status: Active Interventions: Assess patient/caregiver ability to obtain necessary supplies Assess patient/caregiver ability to perform ulcer/skin care regimen upon admission and as needed Assess ulceration(s) every visit Provide education on ulcer and skin care Notes: Electronic Signature(s) Signed: 05/06/2019 6:02:31 PM By: Zandra Abts RN, BSN Entered By: Zandra Abts on 05/06/2019 16:16:23 -------------------------------------------------------------------------------- Pain Assessment Details Patient Name: Date of Service: [name]. 05/06/2019 12:30 PM Medical Record [id number] Patient Account Number: [id number] Date of Birth/Sex: Treating RN: October 29, 1955 (64 y.o. [name] Primary Care Marsi Turvey: Chevis Pretty Other Clinician: Referring Diem Pagnotta: Treating Tanylah Schnoebelen/Extender:Robson, [name], [facility] Weeks in Treatment: 14 Active Problems Location of Pain Severity and Description of Pain Patient Has Paino No Site Locations Pain Management and Medication Current Pain Management: Electronic Signature(s) Signed: 05/06/2019 6:02:31 PM By: Zandra Abts RN, BSN Signed: 05/07/2019 1:29:28 PM By: Karl Ito Entered By: Karl Ito on 05/06/2019 12:58:24 -------------------------------------------------------------------------------- Patient/Caregiver Education Details Patient Name: [name] 4/19/2021andnbsp12:30 Date of Service: PM Medical Record [id number] Number: Patient Account Number: [id number] Date of Birth/Gender: 28-Jan-1955 (63 y.o. F) Treating RN: Zandra Abts Primary Care Other  Clinician: Maryla Morrow, Physician: Humphrey Rolls Physician/ExtenderMaryla Morrow, Referring Physician: Glorianne Manchester in Treatment: 14 Education Assessment Education Provided To: Patient Education Topics Provided Wound/Skin Impairment: Methods: Explain/Verbal Responses: State content correctly Electronic  Signature(s) Signed: 05/06/2019 6:02:31 PM By: Zandra Abts RN, BSN Entered By: Zandra Abts on 05/06/2019 16:16:36 -------------------------------------------------------------------------------- Wound Assessment Details Patient Name: Date of Service: [name]. 05/06/2019 12:30 PM Medical Record [id number] Patient Account Number: [id number] Date of Birth/Sex: Treating RN: [birth year] (63 y.o. [name] Primary Care Glendi Mohiuddin: Chevis Pretty Other Clinician: Referring Trelyn Vanderlinde: Treating Tilak Oakley/Extender:Robson, [name], [facility] Weeks in Treatment: 14 Wound Status Wound Number: 10 Primary Diabetic Wound/Ulcer of the Lower Extremity Etiology: Wound Location: Left, Lateral, Plantar Foot Wound Open Wounding Event: Blister Status: Date Acquired: 11/18/2018 Comorbid Glaucoma, Chronic sinus Weeks Of Treatment: 14 History: problems/congestion, Anemia, Sleep Apnea, History: problems/congestion, Anemia, Sleep Apnea, Clustered Wound: No Hypertension, Type II Diabetes, Rheumatoid Arthritis, Neuropathy Photos Photo Uploaded By: Benjaman Kindler on 05/08/2019 10:14:07 Wound Measurements Length: (cm) 1.3 Width: (cm) 1.4 Depth: (cm) 1.5 Area: (cm) 1.429 Volume: (cm) 2.144 Wound Description Classification: Grade 2 Wound Margin: Thickened Exudate Amount: Medium Exudate Type: Serosanguineous Exudate Color: red, brown Wound Bed Granulation Amount: Large (67-100%) Granulation Quality: Red, Pink Necrotic Amount: None Present (0%) fter Cleansing: No ino No Exposed Structure sed: No Subcutaneous Tissue)  Exposed: Yes sed: No sed: Yes is of Muscle: No ed: No d: No % Reduction in Area: 18.8% % Reduction in Volume: 18.8% Epithelialization: None Tunneling: No Undermining: No Foul Odor A Slough/Fibr Fascia Expo Fat Layer ( Tendon Expo Muscle Expo Necros Joint Expos Bone Expose Treatment Notes Wound #10 (Left, Lateral, Plantar Foot) 1. Cleanse With Wound Cleanser Soap and water 2. Periwound Care Moisturizing lotion 3. Primary Dressing Applied Calcium Alginate Ag 4. Secondary Dressing ABD Pad Roll Gauze Kerramax/Xtrasorb 7. Footwear/Offloading device applied Felt/Foam Notes stockinette Electronic Signature(s) Signed: 05/06/2019 5:42:42 PM By: Shawn Stall Signed: 05/06/2019 6:02:31 PM By: Zandra Abts RN, BSN Entered By: Shawn Stall on 05/06/2019 13:14:19 -------------------------------------------------------------------------------- Wound Assessment Details Patient Name: Date of Service: [name]. 05/06/2019 12:30 PM Medical Record [id number] Patient Account Number: [id number] Date of Birth/Sex: Treating RN: [birth year] (64 y.o. [name] Primary Care Elam Ellis: Chevis Pretty Other Clinician: Referring Aries Kasa: Treating Jakeel Starliper/Extender:Robson, [name], [facility] Weeks in Treatment: 14 Wound Status Wound Number: 9 Primary Diabetic Wound/Ulcer of the Lower Extremity Etiology: Wound Location: Right, Medial Foot Wound Open Wounding Event: Blister Status: Date Acquired: 06/18/2018 Comorbid Glaucoma, Chronic sinus Weeks Of Treatment: 14 History: problems/congestion, Anemia, Sleep Apnea, Clustered Wound: No Hypertension, Type II Diabetes, Rheumatoid Arthritis, Neuropathy Photos Photo Uploaded By: Benjaman Kindler on 05/08/2019 10:14:07 Wound Measurements Length: (cm) 3.6 Width: (cm) 2.6 Depth: (cm) 0.3 Area: (cm) 7.351 Volume: (cm) 2.205 % Reduction in Area: 33.1% % Reduction in Volume:  77.7% Epithelialization: Small (1-33%) Tunneling: No Undermining: Yes Starting Position (o'clock): 7 Ending Position (o'clock): 9 Maximum Distance: (cm) 0.4 Wound Description Classification: Grade 2 Wound Margin: Thickened Exudate Amount: Large Exudate Type: Serosanguineous Exudate Color: red, brown Wound Bed Granulation Amount: Large (67-100%) Granulation Quality: Red, Pink, Hyper-granulation Necrotic Amount: None Present (0%) Foul Odor After Cleansing: No Slough/Fibrino No Exposed Structure Fascia Exposed: No Fat Layer (Subcutaneous Tissue) Exposed: Yes Tendon Exposed: No Muscle Exposed: No Joint Exposed: No Bone Exposed: No Electronic Signature(s) Signed: 05/06/2019 5:42:42 PM By: Shawn Stall Signed: 05/06/2019 6:02:31 PM By: Zandra Abts RN, BSN Entered By: Shawn Stall on 05/06/2019 13:13:40 -------------------------------------------------------------------------------- Vitals Details Patient Name: Date of Service: [name]. 05/06/2019 12:30 PM Medical Record [id number] Patient Account Number: [id number] Date of Birth/Sex: Treating RN: Nov 13, 1955 (64 y.o. [name] Primary Care Kalese Ensz: Chevis Pretty Other Clinician: Referring Janiesha Diehl: Treating Raygen Dahm/Extender:Robson,  [name], [facility] Weeks in Treatment: 14 Vital Signs Time Taken: 12:57 Temperature (F): 98.1 Height (in): 68 Pulse (bpm): 70 Weight (lbs): 265 Respiratory Rate (breaths/min): 18 Body Mass Index (BMI): 40.3 Blood Pressure (mmHg): 138/63 Reference Range: 80 - 120 mg / dl Electronic Signature(s) Signed: 05/07/2019 1:29:28 PM By: Karl Ito Entered By: Karl Ito on 05/06/2019 12:58:12

## 2019-05-07 NOTE — Telephone Encounter (Signed)
Yes

## 2019-05-07 NOTE — Telephone Encounter (Signed)
Dewayne Hatch, Rn with [facility] called office for clarification on orders from 4/12.  States orders were given to extend antibiotics though 4/28. Would like to know if MD would still like to continue with weekly labs and picc maintenance. P: [phone] Lorenso Courier, CMA

## 2019-05-08 LAB — HM DIABETES EYE EXAM

## 2019-05-10 ENCOUNTER — Encounter (HOSPITAL_BASED_OUTPATIENT_CLINIC_OR_DEPARTMENT_OTHER): Payer: Medicare Other | Admitting: Internal Medicine

## 2019-05-10 ENCOUNTER — Other Ambulatory Visit: Payer: Self-pay

## 2019-05-10 DIAGNOSIS — E11621 Type 2 diabetes mellitus with foot ulcer: Secondary | ICD-10-CM | POA: Diagnosis not present

## 2019-05-10 NOTE — Progress Notes (Signed)
[name], [name] ([id number]) Visit Report for 05/10/2019 Arrival Information Details Patient Name: Date of Service: [name], [name] 05/10/2019 1:30 PM Medical Record [id number] Patient Account Number: [id number] Date of Birth/Sex: Treating RN: [birth year] (64 y.o. F) Dwiggins, Shannon Primary Care Daijha Leggio: Chevis Pretty Other Clinician: Referring Brie Eppard: Treating Madyn Ivins/Extender:Robson, [name], [facility] Weeks in Treatment: 14 Visit Information History Since Last Visit Cane Added or deleted any medications: No Patient Arrived: 13:31 Any new allergies or adverse reactions: No Arrival Time: Had a fall or experienced change in No Accompanied By: self None activities of daily living that may affect Transfer Assistance: risk of falls: Patient Identification Verified: Yes Signs or symptoms of abuse/neglect since No Secondary Verification Process Completed: Yes last visito Patient Requires Transmission-Based No Hospitalized since last visit: No Precautions: Implantable device outside of the clinic No Patient Has Alerts: No excluding cellular tissue based products placed in the center since last visit: Has Dressing in Place as Prescribed: Yes Has Footwear/Offloading in Place as Yes Prescribed: Right: Total Contact Cast Pain Present Now: No Electronic Signature(s) Signed: 05/10/2019 5:04:12 PM By: Shawn Stall Entered By: Shawn Stall on 05/10/2019 13:31:25 -------------------------------------------------------------------------------- Encounter Discharge Information Details Patient Name: Date of Service: Sarah Jaeger. 05/10/2019 1:30 PM Medical Record [id number] Patient Account Number: [id number] Date of Birth/Sex: Treating RN: [birth year] (64 y.o. [name] Primary Care Vetra Shinall: Chevis Pretty Other Clinician: Referring Donabelle Molden: Treating Lorriane Dehart/Extender:Robson, [name], [facility] Weeks in  Treatment: 14 Encounter Discharge Information Items Discharge Condition: Stable Ambulatory Status: Crutches Discharge Destination: Home Transportation: Private Auto Accompanied By: self Schedule Follow-up Appointment: Yes Clinical Summary of Care: Electronic Signature(s) Signed: 05/10/2019 5:04:12 PM By: Shawn Stall Entered By: Shawn Stall on 05/10/2019 13:44:04 -------------------------------------------------------------------------------- Multi Wound Chart Details Patient Name: Date of Service: Sarah Jaeger. 05/10/2019 1:30 PM Medical Record [id number] Patient Account Number: [id number] Date of Birth/Sex: Treating RN: 01-Aug-1955 (63 y.o. 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] Comorbid History: [9:Glaucoma, Chronic sinus N/A problems/congestion, Anemia, Sleep Apnea, Hypertension, Type II Diabetes, Rheumatoid Arthritis, Neuropathy] Date Acquired: [9:06/18/2018] [N/A:N/A] Weeks of Treatment: [9:14] [N/A:N/A] Wound Status: [9:Open] [N/A:N/A] Measurements L x W x D 3.6x2.6x0.3 [N/A:N/A] (cm) Area (cm) : [9:7.351] [N/A:N/A] Volume (cm) : [9:2.205] [N/A:N/A] % Reduction in Area: [9:33.10%] [N/A:N/A] % Reduction in Volume: 77.70% [N/A:N/A] Classification: [9:Grade 2] [N/A:N/A] Exudate Amount: [9:Large] [N/A:N/A] Exudate Type: [9:Serosanguineous] [N/A:N/A] Exudate Color: [9:red, brown] [N/A:N/A] Wound Margin: [9:Thickened] [N/A:N/A] Granulation Amount:  [9:Large (67-100%)] [N/A:N/A] Granulation Quality: [9:Red, Pink, Hyper- granulation] [N/A:N/A] Necrotic Amount: [9:None Present (0%)] [N/A:N/A] Exposed Structures: [9:Fat Layer (Subcutaneous Tissue) Exposed: Yes Fascia: No Tendon: No Muscle: No Joint: No Bone: No] [N/A:N/A] Epithelialization: [9:Small (1-33%)] [N/A:N/A] Assessment Notes: [9:maceration periwound. Total Contact Cast] [N/A:N/A N/A] Treatment Notes Wound #9 (Right, Medial Foot) 1. Cleanse With Wound Cleanser Soap and water 2. Periwound Care Moisturizing lotion 3. Primary Dressing Applied Calcium Alginate Ag 4. Secondary Dressing ABD Pad Dry Gauze Roll Gauze Kerramax/Xtrasorb 5. Secured With Medipore tape 7. Footwear/Offloading device applied Total Contact Cast Notes TCC applied by MD. Electronic Signature(s) Signed: 05/10/2019 5:13:47 PM By: [name] Signed: 05/10/2019 5:22:40 PM By: Baltazar Najjar MD Entered By: Baltazar Najjar on 05/10/2019 16:44:30 -------------------------------------------------------------------------------- Multi-Disciplinary  Care Plan Details Patient Name: Date of Service: [name], [name] 05/10/2019 1:30 PM Medical Record [id number] Patient Account Number: [id number] Date of Birth/Sex: Treating RN: Oct 03, 1955 (64 y.o. [name] Primary Care Marbin Olshefski: Chevis Pretty Other Clinician: Referring Shaquella Stamant: Treating Taeler Winning/Extender:Robson, [name], [facility] Weeks in Treatment: 14 Active Inactive Wound/Skin Impairment Nursing Diagnoses: Impaired tissue integrity Knowledge deficit related to ulceration/compromised skin integrity Goals: Patient/caregiver will verbalize understanding of skin care regimen Date Initiated: 01/28/2019 Target Resolution Date: 05/16/2019 Goal Status: Active Interventions: Assess patient/caregiver ability to obtain necessary supplies Assess patient/caregiver ability to perform ulcer/skin care regimen upon  admission and as needed Assess ulceration(s) every visit Provide education on ulcer and skin care Notes: Electronic Signature(s) Signed: 05/10/2019 5:04:12 PM By: Shawn Stall Signed: 05/10/2019 5:13:47 PM By: [name] Entered By: Shawn Stall on 05/10/2019 13:42:30 -------------------------------------------------------------------------------- Pain Assessment Details Patient Name: Date of Service: Sarah Jaeger. 05/10/2019 1:30 PM Medical Record [id number] Patient Account Number: [id number] Date of Birth/Sex: Treating RN: Aug 24, 1955 (64 y.o. [name] Primary Care Krisy Dix: Chevis Pretty Other Clinician: Referring Ahliya Glatt: Treating Beata Beason/Extender:Robson, [name], [name] Weeks in Treatment: 14 Active Problems Location of Pain Severity and Description of Pain Patient Has Paino No Site Locations Rate the pain. Current Pain Level: 0 Pain Management and Medication Current Pain Management: Medication: No Cold Application: No Rest: No Massage: No Activity: No T.E.N.S.: No Heat Application: No Leg drop or elevation: No Is the Current Pain Management Adequate: Adequate How does your wound impact your activities of daily livingo Sleep: No Bathing: No Appetite: No Relationship With Others: No Bladder Continence: No Emotions: No Bowel Continence: No Work: No Toileting: No Drive: No Dressing: No Hobbies: No Electronic Signature(s) Signed: 05/10/2019 5:04:12 PM By: Shawn Stall Signed: 05/10/2019 5:13:47 PM By: [name] Entered By: Shawn Stall on 05/10/2019 13:32:14 -------------------------------------------------------------------------------- Patient/Caregiver Education Details Patient Name: Sarah Jaeger 4/23/2021andnbsp1:30 Date of Service: PM Medical Record [id number] Number: Patient Account Number: [id number] Date of Birth/Gender: 03/24/1955 (63 y.o. F) Treating RN: [name] Primary  Care Other Clinician: Maryla Morrow, Physician: Humphrey Rolls Physician/ExtenderMaryla Morrow, Referring Physician: Glorianne [place] in Treatment: 14 Education Assessment Education Provided To: Patient Education Topics Provided Offloading: Handouts: What is Offloadingo Methods: Explain/Verbal Responses: Reinforcements needed Electronic Signature(s) Signed: 05/10/2019 5:04:12 PM By: Shawn Stall Entered By: Shawn Stall on 05/10/2019 13:42:53 -------------------------------------------------------------------------------- Wound Assessment Details Patient Name: Date of Service: [name], [name] 05/10/2019 1:30 PM Medical Record [id number] Patient Account Number: [id number] Date of Birth/Sex: Treating RN: 12-15-1955 (63 y.o. [name] Primary Care Manasvi Dickard: Chevis Pretty Other Clinician: Referring Annalissa Murphey: Treating Sharelle Burditt/Extender:Robson, [name], [facility] Weeks in Treatment: 14 Wound Status Wound Number: [id number] Primary Diabetic Wound/Ulcer of the Lower Extremity Etiology: Wound Location: Right, Medial Foot Wound Open Wounding Event: Blister Status: Date Acquired: 06/18/2018 Comorbid Glaucoma, Chronic sinus Weeks Of Treatment: 14 History: problems/congestion, Anemia, Sleep Apnea, Clustered Wound: No Hypertension, Type II Diabetes, Rheumatoid Arthritis, Neuropathy Wound Measurements Length: (cm) 3.6 % Reducti Width: (cm) 2.6 % Reducti Depth: (cm) 0.3 Epithelia Area: (cm) 7.351 Tunnelin Volume: (cm) 2.205 Undermin Wound Description Classification: Grade 2 Wound Margin: Thickened Exudate Amount: Large Exudate Type: Serosanguineous Exudate Color: red, brown Wound Bed Granulation Amount: Large (67-100%) Granulation Quality: Red, Pink, Hyper-granulation Necrotic Amount: None Present (0%) Assessment Notes maceration periwound. Treatment Notes Wound #9 (Right, Medial Foot) 1. Cleanse With Wound Cleanser Soap  and water 2. Periwound Care Moisturizing lotion 3. Primary Dressing Applied Calcium Alginate Ag 4. Secondary Dressing ABD Pad Dry Gauze Roll Gauze  Kerramax/Xtrasorb 5. Secured With Medipore tape 7. Footwear/Offloading device applied Total Contact Cast Foul Odor After Cleansing: No Slough/Fibrino No Exposed Structure Fascia Exposed: No Fat Layer (Subcutaneous Tissue) Exposed: Yes Tendon Exposed: No Muscle Exposed: No Joint Exposed: No Bone Exposed: No on in Area: 33.1% on in Volume: 77.7% lization: Small (1-33%) g: No ing: No Notes TCC applied by MD. Electronic Signature(s) Signed: 05/10/2019 5:04:12 PM By: Deon Pilling Signed: 05/10/2019 5:13:47 PM By: Kela Millin Entered By: Deon Pilling on 05/10/2019 13:41:54 -------------------------------------------------------------------------------- [place] Details Patient Name: Date of Service: Sarah Libra. 05/10/2019 1:30 PM Medical Record [id number] Patient Account Number: [id number] Date of Birth/Sex: Treating RN: 20-Sep-1955 (64 y.o. Clearnce Sorrel Primary Care Jani Moronta: Dewayne Hatch Other Clinician: Referring Kebin Maye: Treating Mikeila Burgen/Extender:Robson, [name], [facility] Weeks in Treatment: 14 Vital Signs Time Taken: 13:28 Temperature (F): 97.6 Height (in): 68 Pulse (bpm): 78 Weight (lbs): 265 Respiratory Rate (breaths/min): 16 Body Mass Index (BMI): 40.3 Blood Pressure (mmHg): 128/54 Reference Range: 80 - 120 mg / dl Electronic Signature(s) Signed: 05/10/2019 5:04:12 PM By: Deon Pilling Entered By: Deon Pilling on 05/10/2019 13:31:39

## 2019-05-10 NOTE — Progress Notes (Signed)
[name] ([id number]) Visit Report for 05/10/2019 HPI Details Patient Name: Date of Service: [name], [name] 05/10/2019 1:30 PM Medical Record [id number] Patient Account Number: [id number] Date of Birth/Sex: Treating RN: 02-16-1955 (64 y.o. [name] Primary Care Provider: Chevis Pretty Other Clinician: Referring Provider: Treating Provider/Extender:Loras Grieshop, [name], [facility] Weeks in Treatment: 14 History of Present Illness HPI Description: 64 yrs old bf here for follow up recurrent left charcot foot ulcer. She has DM. She is not a smoker. She states a blister developed 3 months ago at site of previous breakdown from 1 year ago. It was debrided at the podiatrist's office. She went to the ER and then sent here. She denies pain. Xray was negative for osteo. Culture from this clinic negative. 09/08/14 [name] to [name] for Charcot foot. Seen by him and MRI scheduled 09/19/14. Prior silver collagen but this was not ordered last visit, patient has been rinsing with saline alone. Re institute silver collagen every other day. F/u 2 weeks with [name] as Labor Day holiday. Supplies ordered. 09/25/14; this is a patient with a Charcot foot on the left. she has a wound over the plantar aspect of her left calcaneus. She has been seen by [name] of orthopedic surgery and has had an MRI. She is apparently going within the next week or 2 for corrective surgery which will involve an external fixator. I don't have any of the details of this. 10/06/14; The patient is a 64 yrs old bf with a left Charcot foot and ulcer on the plantar. 12/01/14 Returns post surgery with [name]. Has follow up visit later this week with him, currently in facility and NWB over this extremity. States no drainage from foot and no current wound care. On exam callus and scab in place, suspect healed. Has external fixator in place. Will defer to [name]  for management, ok to place moisturizing cream over scabs and callus and she will call if she requires follow up here. READMISSION 01/28/2019 Patient was in the clinic in 2016 for a wound on the plantar aspect of her left calcaneus. Predominantly cared for by [name] she was referred to [name] and had corrective surgery for the position of her left ankle I believe. She had previously been here in 2015 and then prior to that in 2011 2012 that I do not have these records. More recently she has developed fairly extensive wounds on the right medial foot, left lateral foot and a small area on the right medial great toe. Right medial foot wound has been there for 6 to 7 months, left foot wound for 2 to 3 months and the right great toe only over the last few weeks. She has been followed by [name] at Dr. Laverta [place] office it sounds as though she has been undergoing callus paring and silver nitrate. The patient has been washing these off with soap and water and plan applying dry dressing Past medical history, type 2 diabetes well controlled with a recent hemoglobin A1c of 7 on 11/16, type 2 diabetes with peripheral neuropathy, previous left Charcot joint surgery bilateral Charcot joints currently, stage III chronic renal failure, hypertension, obstructive sleep apnea, history of MRSA and gastroesophageal reflux. 1/18; this is a patient with bilateral Charcot feet. Plain x-rays that I did of both of these wounds that are either at bone or precariously close to bone were done. On the right foot this showed possible lytic destruction involving the anterior aspect  of the talus which may represent a wound osteomyelitis. An MRI was recommended. On the left there was no definite lytic destruction although the wound is deep undermining's and I think probably requires an MRI on this basis in any case 1/25; patient was supposed to have her MRI last Friday of her bilateral feet however they would not  do it because there was silver alginate in her dressings, will replace with calcium alginate today and will see if we can get her done today 2/1; patient did not get her MRIs done last week because of issues with the MRI department receiving orders to do bilateral feet. She has 2 deep wounds in the setting of Charcot feet deformities bilaterally. Both of them at one point had exposed bone although I had trouble demonstrating that today. I am not sure that we can offload these very aggressively as I watched her leave the clinic last week her gait is very narrow and unsteady. 2/15; the MRI of her right foot did not show osteomyelitis potentially osteonecrosis of the talus. 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 The wounds are a large wound on the right medial foot and on the left a substantial wound on the left lateral plantar calcaneus. We have been using silver alginate. Ultimately I think we need to consider a total contact cast on the right while we work through the possibility of osteomyelitis in the left. I watched her walk out with her crutches I have some trepidation about the ability to walk with the cast on her right foot. Nevertheless with her Charcot deformities I do not think there is a way to heal these short of offloading them aggressively. 2/22; the culture of the bone in the left foot that I did last week showed a few Citrobacter Koseri as well as some streptococcal species. In my attempted bone for pathology did not actually yield a lot of bone the pathologist did not identify any osteomyelitis. 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 Is promised today and aggressive debridement of the right foot and a total contact cast which surprisingly she seemed to tolerate quite well 03/13/2019  upon evaluation today patient appears to be doing well with her total contact cast she is here for the first cast change. She had no areas of rubbing or any other complications at this point. Overall things are doing quite well. 2/26; the patient was kindly seen by Dr. Orvan Falconer of infectious disease on 2/25. She is now on IV vancomycin PICC line placement in 2 days and oral Flagyl. This was in response to her MRI showing osteomyelitis of the left calcaneus concerning for osteomyelitis. We have been putting her right leg in a total contact cast. Our nurses report drainage. She is tolerating the total contact cast well. 3/4; the patient is started her IV antibiotics and is taking IV vancomycin and oral Flagyl. She appears to be tolerating these both well. This is for osteomyelitis involving the left calcaneus. The area on the right in the setting of bilateral Charcot deformities did not have any bone infection on MRI. We put a total contact cast on her with silver alginate as the primary dressing and she returns today in follow-up. Per our intake nurse there was too much drainage to leave this on all week therefore  we will bring her back for an early change 3/8; follow-up total contact cast she is still having a lot of drainage probably too much drainage from the right foot wound will week with the same cast. She will be back on Thursday. The area on the left looks somewhat better 3/11; patient here for a total contact cast change. 3/15; patient came in for wound care evaluation. She is still on IV vancomycin and oral Flagyl for osteomyelitis in the left foot. The area on the right foot we are putting in a total contact cast. 3/18 for total contact cast change on the right foot 3/22; the patient was here for wound review today. The area on the right foot is slightly smaller in terms of surface area. However the surface of the wound is very gritty and hyper granulated. More problematically the area  on the plantar left foot has increased depth indeed in the center of this it probes to bone. We have been using Hydrofera Blue in both areas. She is on antibiotics as prescribed by infectious disease IV vancomycin and oral Flagyl 3/25; the patient is in for total contact cast change. Our intake nurse was concerned about the amount of drainage which soaked right to the multiple layers we are putting on this. Wondered about options. The wound bed actually looks as healthy as this is looked although there may not be a lot of change in dimensions things are looking a lot better. If we are going to heal this woman's wound which is in the middle of her right Charcot foot this is going to need to be offloaded. There are not many alternatives 3/29; still not a lot of improvement although the surface of the right wound looks better. We have been using Hydrofera Blue 04/17/2019 upon evaluation today patient appears to be doing well with a total contact cast. With actually having to change this twice a week. She saw [name] for wound care earlier in the week this is for the cast change today. That is due to the fact that she has significant drainage. Overall the wound is been looking excellent however. 4/5; we continue to put a total contact cast on this patient with Hydrofera Blue. Still a lot of drainage which precludes a simple weekly change or changing this twice a week. 4/8; total contact cast changed today. Apparently the wound looks better per our intake nurse 4/12; patient here for wound care evaluation. Wounds on the right foot have not changed in dimension none about a month left foot looks similar. Apparently her antibiotics that we are giving her for osteomyelitis in the left foot complete on Wednesday. We have been using Hydrofera Blue on the right foot under a total contact cast. Nurses report greenish drainage although I think this may be discoloration from the [facility] 4/15; back for  cast change wounds look quite good although still moderate amount of drainage. Total contact cast reapplied 4/19;. Wound evaluation. Wound on the right foot is smaller surface looks healthy. There is still the divot in the middle part of this wound but I could not feel any bone. Area on the left foot also looks better She has completed her antibiotics but still has the PICC line in. 4/23; patient comes back for a total contact cast change this was done in the standard fashion no issues Electronic Signature(s) Signed: 05/10/2019 5:22:40 PM By: Baltazar Najjar MD Entered By: Baltazar Najjar on 05/10/2019 16:45:44 -------------------------------------------------------------------------------- Physical Exam Details Patient Name: Date  of Service: DAJUANA, PALEN 05/10/2019 1:30 PM Medical Record [id number] Patient Account Number: [id number] Date of Birth/Sex: Treating RN: [birth year] (64 y.o. [name] Primary Care Provider: Chevis Pretty Other Clinician: Referring Provider: Treating Provider/Extender:Judieth Mckown, [name], [facility] Weeks in Treatment: 14 Constitutional Sitting or standing Blood Pressure is within target range for patient.. Pulse regular and within target range for patient.Marland Kitchen Respirations regular, non-labored and within target range.. Temperature is normal and within the target range for the patient.Marland Kitchen Appears in no distress. Notes Wound exam; I did not actually see the wound today. Standard total contact cast Electronic Signature(s) Signed: 05/10/2019 5:22:40 PM By: Baltazar Najjar MD Entered By: Baltazar Najjar on 05/10/2019 16:46:57 -------------------------------------------------------------------------------- Physician Orders Details Patient Name: Date of Service: [name] 05/10/2019 1:30 PM Medical Record [id number] Patient Account Number: [id number] Date of Birth/Sex: Treating RN: 1955-07-27 (63 y.o. [name] Primary Care Provider: Chevis Pretty Other Clinician: Referring Provider: Treating Provider/Extender:Briannon Boggio, [name], [facility] Weeks in Treatment: 30 Verbal / Phone Orders: No Diagnosis Coding Follow-up Appointments Return Appointment in 1 week. - MD visit Return Appointment in: - Thursday or Friday for cast change Dressing Change Frequency Wound #10 Left,Lateral,Plantar Foot Change Dressing every other day. Wound #9 Right,Medial Foot Do not change entire dressing for one week. Wound Cleansing May shower with protection. Primary Wound Dressing Wound #10 Left,Lateral,Plantar Foot Calcium Alginate with Silver Wound #9 Right,Medial Foot Calcium Alginate with Silver Secondary Dressing Wound #10 Left,Lateral,Plantar Foot Kerlix/Rolled Gauze Dry Gauze Other: - felt callous pad Wound #9 Right,Medial Foot Kerlix/Rolled Gauze Zetuvit or Kerramax Drawtex Carboflex Off-Loading Total Contact Cast to Right Lower Extremity Open toe surgical shoe to: - add felt to sandal also Electronic Signature(s) Signed: 05/10/2019 5:04:12 PM By: Shawn Stall Signed: 05/10/2019 5:22:40 PM By: Baltazar Najjar MD Entered By: Shawn Stall on 05/10/2019 13:42:13 -------------------------------------------------------------------------------- Problem List Details Patient Name: Date of Service: [name] 05/10/2019 1:30 PM Medical Record [id number] Patient Account Number: [id number] Date of Birth/Sex: Treating RN: 1955/06/24 (64 y.o. [name] Primary Care Provider: Chevis Pretty Other Clinician: Referring Provider: Treating Provider/Extender:Kiala Faraj, [name], [facility] Weeks in Treatment: 14 Active Problems ICD-10 Evaluated Encounter Code Description Active Date Today Diagnosis E11.621 Type 2 diabetes mellitus with foot ulcer 01/28/2019 No Yes L97.524 Non-pressure chronic ulcer of other part of left foot 01/28/2019 No  Yes with necrosis of bone L97.511 Non-pressure chronic ulcer of other part of right foot 01/28/2019 No Yes limited to breakdown of skin L97.514 Non-pressure chronic ulcer of other part of right foot 01/28/2019 No Yes with necrosis of bone E11.42 Type 2 diabetes mellitus with diabetic polyneuropathy 01/28/2019 No Yes M14.671 Charcot's joint, right ankle and foot 01/28/2019 No Yes M14.672 Charcot's joint, left ankle and foot 01/28/2019 No Yes 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 05/10/2019 1:30 PM Medical Record [id number] Patient Account Number: [id number] Date of Birth/Sex: Treating RN: 1955/05/30 (64 y.o. [name] Primary Care Provider: Chevis Pretty Other Clinician: Referring Provider: Treating Provider/Extender:Poetry Cerro, [name], [facility] Weeks in Treatment: 14 Subjective History of Present Illness (HPI) 64 yrs old bf here for follow up recurrent left charcot foot ulcer. She has DM. She is not a smoker. She states a blister developed 3 months ago at site of previous breakdown from 1 year ago. It  was debrided at the podiatrist's office. She went to the ER and then sent here. She denies pain. Xray was negative for osteo. Culture from this clinic negative. 09/08/14 Referred by [name] to [name] for Charcot foot. Seen by him and MRI scheduled 09/19/14. Prior silver collagen but this was not ordered last visit, patient has been rinsing with saline alone. Re institute silver collagen every other day. F/u 2 weeks with [name] as Labor Day holiday. Supplies ordered. 09/25/14; this is a patient with a  Charcot foot on the left. she has a wound over the plantar aspect of her left calcaneus. She has been seen by [name] of orthopedic surgery and has had an MRI. She is apparently going within the next week or 2 for corrective surgery which will involve an external fixator. I don't have any of the details of this. 10/06/14; The patient is a 64 yrs old bf with a left Charcot foot and ulcer on the plantar. 12/01/14 Returns post surgery with [name]. Has follow up visit later this week with him, currently in facility and NWB over this extremity. States no drainage from foot and no current wound care. On exam callus and scab in place, suspect healed. Has external fixator in place. Will defer to [name] for management, ok to place moisturizing cream over scabs and callus and she will call if she requires follow up here. READMISSION 01/28/2019 Patient was in the clinic in 2016 for a wound on the plantar aspect of her left calcaneus. Predominantly cared for by [name] she was referred to [name] and had corrective surgery for the position of her left ankle I believe. She had previously been here in 2015 and then prior to that in 2011 2012 that I do not have these records. More recently she has developed fairly extensive wounds on the right medial foot, left lateral foot and a small area on the right medial great toe. Right medial foot wound has been there for 6 to 7 months, left foot wound for 2 to 3 months and the right great toe only over the last few weeks. She has been followed by [name] at Dr. Laverta [place] office it sounds as though she has been undergoing callus paring and silver nitrate. The patient has been washing these off with soap and water and plan applying dry dressing Past medical history, type 2 diabetes well controlled with a recent hemoglobin A1c of 7 on 11/16, type 2 diabetes with peripheral neuropathy, previous left Charcot joint surgery bilateral Charcot joints  currently, stage III chronic renal failure, hypertension, obstructive sleep apnea, history of MRSA and gastroesophageal reflux. 1/18; this is a patient with bilateral Charcot feet. Plain x-rays that I did of both of these wounds that are either at bone or precariously close to bone were done. On the right foot this showed possible lytic destruction involving the anterior aspect of the talus which may represent a wound osteomyelitis. An MRI was recommended. On the left there was no definite lytic destruction although the wound is deep undermining's and I think probably requires an MRI on this basis in any case 1/25; patient was supposed to have her MRI last Friday of her bilateral feet however they would not do it because there was silver alginate in her dressings, will replace with calcium alginate today and will see if we can get her done today 2/1; patient did not get her MRIs done last week because of issues with the MRI  department receiving orders to do bilateral feet. She has 2 deep wounds in the setting of Charcot feet deformities bilaterally. Both of them at one point had exposed bone although I had trouble demonstrating that today. I am not sure that we can offload these very aggressively as I watched her leave the clinic last week her gait is very narrow and unsteady. 2/15; the MRI of her right foot did not show osteomyelitis potentially osteonecrosis of the talus. 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 The wounds are a large wound on the right medial foot and on the left a substantial wound on the left lateral plantar calcaneus. We have been using silver alginate. Ultimately I think we need to consider a total contact cast on the right while we work through the possibility of osteomyelitis in the left. I watched her walk out with her crutches I have some trepidation about the ability to walk with the cast on her right foot. Nevertheless  with her Charcot deformities I do not think there is a way to heal these short of offloading them aggressively. 2/22; the culture of the bone in the left foot that I did last week showed a few Citrobacter Koseri as well as some streptococcal species. In my attempted bone for pathology did not actually yield a lot of bone the pathologist did not identify any osteomyelitis. 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 Is promised today and aggressive debridement of the right foot and a total contact cast which surprisingly she seemed to tolerate quite well 03/13/2019 upon evaluation today patient appears to be doing well with her total contact cast she is here for the first cast change. She had no areas of rubbing or any other complications at this point. Overall things are doing quite well. 2/26; the patient was kindly seen by Dr. Orvan Falconer of infectious disease on 2/25. She is now on IV vancomycin PICC line placement in 2 days and oral Flagyl. This was in response to her MRI showing osteomyelitis of the left calcaneus concerning for osteomyelitis. We have been putting her right leg in a total contact cast. Our nurses report drainage. She is tolerating the total contact cast well. 3/4; the patient is started her IV antibiotics and is taking IV vancomycin and oral Flagyl. She appears to be tolerating these both well. This is for osteomyelitis involving the left calcaneus. The area on the right in the setting of bilateral Charcot deformities did not have any bone infection on MRI. We put a total contact cast on her with silver alginate as the primary dressing and she returns today in follow-up. Per our intake nurse there was too much drainage to leave this on all week therefore we will bring her back for an early change 3/8; follow-up total contact cast she  is still having a lot of drainage probably too much drainage from the right foot wound will week with the same cast. She will be back on Thursday. The area on the left looks somewhat better 3/11; patient here for a total contact cast change. 3/15; patient came in for wound care evaluation. She is still on IV vancomycin and oral Flagyl for osteomyelitis in the left foot. The area on the right foot we are putting in a total contact cast. 3/18 for total contact  cast change on the right foot 3/22; the patient was here for wound review today. The area on the right foot is slightly smaller in terms of surface area. However the surface of the wound is very gritty and hyper granulated. More problematically the area on the plantar left foot has increased depth indeed in the center of this it probes to bone. We have been using Hydrofera Blue in both areas. She is on antibiotics as prescribed by infectious disease IV vancomycin and oral Flagyl 3/25; the patient is in for total contact cast change. Our intake nurse was concerned about the amount of drainage which soaked right to the multiple layers we are putting on this. Wondered about options. The wound bed actually looks as healthy as this is looked although there may not be a lot of change in dimensions things are looking a lot better. If we are going to heal this woman's wound which is in the middle of her right Charcot foot this is going to need to be offloaded. There are not many alternatives 3/29; still not a lot of improvement although the surface of the right wound looks better. We have been using Hydrofera Blue 04/17/2019 upon evaluation today patient appears to be doing well with a total contact cast. With actually having to change this twice a week. She saw [name] for wound care earlier in the week this is for the cast change today. That is due to the fact that she has significant drainage. Overall the wound is been looking excellent  however. 4/5; we continue to put a total contact cast on this patient with Hydrofera Blue. Still a lot of drainage which precludes a simple weekly change or changing this twice a week. 4/8; total contact cast changed today. Apparently the wound looks better per our intake nurse 4/12; patient here for wound care evaluation. Wounds on the right foot have not changed in dimension none about a month left foot looks similar. Apparently her antibiotics that we are giving her for osteomyelitis in the left foot complete on Wednesday. We have been using Hydrofera Blue on the right foot under a total contact cast. Nurses report greenish drainage although I think this may be discoloration from the [facility] 4/15; back for cast change wounds look quite good although still moderate amount of drainage. Total contact cast reapplied 4/19;. Wound evaluation. Wound on the right foot is smaller surface looks healthy. There is still the divot in the middle part of this wound but I could not feel any bone. Area on the left foot also looks better She has completed her antibiotics but still has the PICC line in. 4/23; patient comes back for a total contact cast change this was done in the standard fashion no issues Objective Constitutional Sitting or standing Blood Pressure is within target range for patient.. Pulse regular and within target range for patient.Marland Kitchen Respirations regular, non-labored and within target range.. Temperature is normal and within the target range for the patient.Marland Kitchen Appears in no distress. Vitals Time Taken: 1:28 PM, Height: 68 in, Weight: 265 lbs, BMI: 40.3, Temperature: 97.6 F, Pulse: 78 bpm, Respiratory Rate: 16 breaths/min, Blood Pressure: 128/54 mmHg. General Notes: Wound exam; I did not actually see the wound today. Standard total contact cast Integumentary (Hair, Skin) Wound #9 status is Open. Original cause of wound was Blister. The wound is located on the Right,Medial Foot.  The wound measures 3.6cm length x 2.6cm width x 0.3cm depth; 7.351cm^2 area and 2.205cm^3 volume. There  is Fat Layer (Subcutaneous Tissue) Exposed exposed. There is no tunneling or undermining noted. There is a large amount of serosanguineous drainage noted. The wound margin is thickened. There is large (67-100%) red, pink, hyper - granulation within the wound bed. There is no necrotic tissue within the wound bed. General Notes: maceration periwound. 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 There was a Total Contact Cast Procedure by Ricard Dillon., MD. Post procedure Diagnosis Wound #9: Same as Pre-Procedure Plan Follow-up Appointments: Return Appointment in 1 week. - MD visit Return Appointment in: - Thursday or Friday for cast change Dressing Change Frequency: Wound #10 Left,Lateral,Plantar Foot: Change Dressing every other day. Wound #9 Right,Medial Foot: Do not change entire dressing for one week. Wound Cleansing: May shower with protection. Primary Wound Dressing: Wound #10 Left,Lateral,Plantar Foot: Calcium Alginate with Silver Wound #9 Right,Medial Foot: Calcium Alginate with Silver Secondary Dressing: Wound #10 Left,Lateral,Plantar Foot: Kerlix/Rolled Gauze Dry Gauze Other: - felt callous pad Wound #9 Right,Medial Foot: Kerlix/Rolled Gauze Zetuvit or  Kerramax Drawtex Carboflex Off-Loading: Total Contact Cast to Right Lower Extremity Open toe surgical shoe to: - add felt to sandal also 1. Continue with silver alginate as the primary dressing 2. We will be back next week for wound review Electronic Signature(s) Signed: 05/10/2019 5:22:40 PM By: Linton Ham MD Entered By: Linton Ham on 05/10/2019 16:47:47 -------------------------------------------------------------------------------- Total Contact Cast Details Patient Name: Date of Service: HAGEN, TIDD 05/10/2019 1:30 PM Medical Record [id number] Patient Account Number: [id number] Date of Birth/Sex: Treating RN: 1955/10/10 (64 y.o. Clearnce Sorrel Primary Care Provider: Dewayne Hatch Other Clinician: Referring Provider: Dewayne Hatch Treating Provider/Extender:Lauramae Kneisley, Esperanza Richters in Treatment: 14 Total Contact Cast Applied for Wound Assessment: Wound #9 Right,Medial Foot Performed By: Physician Ricard Dillon., MD Post Procedure Diagnosis Same as Pre-procedure Electronic Signature(s) Signed: 05/10/2019 5:22:40 PM By: Linton Ham MD Entered By: Linton Ham on 05/10/2019 16:45:15 -------------------------------------------------------------------------------- SuperBill Details Patient Name: Date of Service: [name] 05/10/2019 Medical Record [id number] Patient Account Number: [id number] Date of Birth/Sex: Treating RN: Sep 01, 1955 (63 y.o. Clearnce Sorrel Primary Care Provider: Dewayne Hatch Other Clinician: Referring Provider: Treating Provider/Extender:Anzlee Hinesley, [name], [facility] Weeks in Treatment: 14 Diagnosis Coding ICD-10 Codes Code Description E11.621 Type 2 diabetes mellitus with foot ulcer L97.524 Non-pressure chronic ulcer of other part of left foot with necrosis of bone L97.511 Non-pressure chronic ulcer of other part of right foot limited to breakdown of skin L97.514  Non-pressure chronic ulcer of other part of right foot with necrosis of bone E11.42 Type 2 diabetes mellitus with diabetic polyneuropathy M14.671 Charcot's joint, right ankle and foot M14.672 Charcot's joint, left ankle and foot 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, left ankle and foot Facility Procedures CPT4 Code Description: [id number] - APPLY TOTAL CONTACT LEG CAST ICD-10 Diagnosis Description L97.511 Non-pressure chronic ulcer of other part of right foot limit E11.621 Type 2 diabetes mellitus with foot ulcer Modifier: ed to breakdo Quantity: 1 wn of skin Physician Procedures  CPT4 Code Description: [id number] - WC PHYS APPLY TOTAL CONTACT CAST ICD-10 Diagnosis Description L97.511 Non-pressure chronic ulcer of other part of right foot limite E11.621 Type 2 diabetes mellitus with foot ulcer Modifier: d to breakdo Quantity: 1 wn of skin Electronic Signature(s) Signed: 05/10/2019 5:22:40 PM By: Baltazar Najjar MD Entered By: Baltazar Najjar on 05/10/2019 16:47:59

## 2019-05-13 ENCOUNTER — Encounter: Payer: Self-pay | Admitting: Internal Medicine

## 2019-05-13 ENCOUNTER — Encounter: Payer: Self-pay | Admitting: *Deleted

## 2019-05-13 ENCOUNTER — Encounter (HOSPITAL_BASED_OUTPATIENT_CLINIC_OR_DEPARTMENT_OTHER): Payer: Medicare Other | Admitting: Internal Medicine

## 2019-05-13 ENCOUNTER — Other Ambulatory Visit: Payer: Self-pay

## 2019-05-13 DIAGNOSIS — E11621 Type 2 diabetes mellitus with foot ulcer: Secondary | ICD-10-CM | POA: Diagnosis not present

## 2019-05-13 NOTE — Progress Notes (Signed)
[name], [name] ([id number]) Visit Report for 05/13/2019 Debridement Details Patient Name: Date of Service: [name], [name] 05/13/2019 2:30 PM Medical Record [id number] Patient Account Number: [id number] Date of Birth/Sex: Treating RN: Nov 06, 1955 (64 y.o. [name] Primary Care Provider: Chevis Pretty Other Clinician: Referring Provider: Chevis Pretty Treating Provider/Extender:Traeton Bordas, Lamar Sprinkles in Treatment: 15 Debridement Performed for Wound #9 Right,Medial Foot Assessment: Performed By: Physician [name], MD Debridement Type: Debridement Severity of Tissue Pre Fat layer exposed Debridement: Level of Consciousness (Pre- Awake and Alert procedure): Pre-procedure Verification/Time Out Taken: Yes - 15:00 Start Time: 15:00 Pain Control: Other : Benzocaine 20% Total Area Debrided (L x W): 3.5 (cm) x 2.9 (cm) = 10.15 (cm) Tissue and other material Viable, Non-Viable, Callus, Slough, Subcutaneous, Skin: Epidermis, Slough debrided: Level: Skin/Subcutaneous Tissue Debridement Description: Excisional Instrument: Blade, Curette Bleeding: Moderate Hemostasis Achieved: Silver Nitrate End Time: 15:04 Procedural Pain: 0 Post Procedural Pain: 0 Response to Treatment: Procedure was tolerated well Level of Consciousness Awake and Alert (Post-procedure): Post Debridement Measurements of Total Wound Length: (cm) 3.5 Width: (cm) 2.9 Depth: (cm) 0.1 Volume: (cm) 0.797 Character of Wound/Ulcer Post Improved Debridement: Severity of Tissue Post Debridement: Fat layer exposed Post Procedure Diagnosis Same as Pre-procedure Electronic Signature(s) Signed: 05/13/2019 5:33:33 PM By: Zandra Abts RN, BSN Signed: 05/13/2019 5:40:04 PM By: Baltazar Najjar MD Entered By: Baltazar Najjar on 05/13/2019 15:11:08 -------------------------------------------------------------------------------- HPI Details Patient Name: Date of Service: [name]. 05/13/2019 2:30 PM Medical Record [id number] Patient Account Number: [id number] Date of Birth/Sex: Treating RN: [birth year] (64 y.o. [name] Primary Care Provider: Chevis Pretty Other Clinician: Referring Provider: Treating Provider/Extender:Gedeon Brandow, [name], [facility] Weeks in Treatment: 15 History of Present Illness HPI Description: 64 yrs old bf here for follow up recurrent left charcot foot ulcer. She has DM. She is not a smoker. She states a blister developed 3 months ago at site of previous breakdown from 1 year ago. It was debrided at the podiatrist's office. She went to the ER and then sent here. She denies pain. Xray was negative for osteo. Culture from this clinic negative. 09/08/14 [name] to [name] for Charcot foot. Seen by him and MRI scheduled 09/19/14. Prior silver collagen but this was not ordered last visit, patient has been rinsing with saline alone. Re institute silver collagen every other day. F/u 2 weeks with [name] as Labor Day holiday. Supplies ordered. 09/25/14; this is a patient with a Charcot foot on the left. she has a wound over the plantar aspect of her left calcaneus. She has been seen by [name] of orthopedic surgery and has had an MRI. She is apparently going within the next week or 2 for corrective surgery which will involve an external fixator. I don't have any of the details of this. 10/06/14; The patient is a 64 yrs old bf with a left Charcot foot and ulcer on the plantar. 12/01/14 Returns post surgery with [name]. Has follow up visit later this week with him, currently in facility and NWB over this extremity. States no drainage from foot and no current wound care. On exam callus and scab in place, suspect healed. Has external fixator in place. Will defer to [name] for management, ok to place moisturizing cream over scabs and callus and she will call if she requires follow up  here. READMISSION 01/28/2019 Patient was in the clinic in 2016 for a wound on the plantar aspect of her left calcaneus. Predominantly cared for by  [name] she was referred to [name] and had corrective surgery for the position of her left ankle I believe. She had previously been here in 2015 and then prior to that in 2011 2012 that I do not have these records. More recently she has developed fairly extensive wounds on the right medial foot, left lateral foot and a small area on the right medial great toe. Right medial foot wound has been there for 6 to 7 months, left foot wound for 2 to 3 months and the right great toe only over the last few weeks. She has been followed by [name] at Dr. Laverta [place] office it sounds as though she has been undergoing callus paring and silver nitrate. The patient has been washing these off with soap and water and plan applying dry dressing Past medical history, type 2 diabetes well controlled with a recent hemoglobin A1c of 7 on 11/16, type 2 diabetes with peripheral neuropathy, previous left Charcot joint surgery bilateral Charcot joints currently, stage III chronic renal failure, hypertension, obstructive sleep apnea, history of MRSA and gastroesophageal reflux. 1/18; this is a patient with bilateral Charcot feet. Plain x-rays that I did of both of these wounds that are either at bone or precariously close to bone were done. On the right foot this showed possible lytic destruction involving the anterior aspect of the talus which may represent a wound osteomyelitis. An MRI was recommended. On the left there was no definite lytic destruction although the wound is deep undermining's and I think probably requires an MRI on this basis in any case 1/25; patient was supposed to have her MRI last Friday of her bilateral feet however they would not do it because there was silver alginate in her dressings, will replace with calcium alginate today and will see if  we can get her done today 2/1; patient did not get her MRIs done last week because of issues with the MRI department receiving orders to do bilateral feet. She has 2 deep wounds in the setting of Charcot feet deformities bilaterally. Both of them at one point had exposed bone although I had trouble demonstrating that today. I am not sure that we can offload these very aggressively as I watched her leave the clinic last week her gait is very narrow and unsteady. 2/15; the MRI of her right foot did not show osteomyelitis potentially osteonecrosis of the talus. 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 The wounds are a large wound on the right medial foot and on the left a substantial wound on the left lateral plantar calcaneus. We have been using silver alginate. Ultimately I think we need to consider a total contact cast on the right while we work through the possibility of osteomyelitis in the left. I watched her walk out with her crutches I have some trepidation about the ability to walk with the cast on her right foot. Nevertheless with her Charcot deformities I do not think there is a way to heal these short of offloading them aggressively. 2/22; the culture of the bone in the left foot that I did last week showed a few Citrobacter Koseri as well as some streptococcal species. In my attempted bone for pathology did not actually yield a lot of bone the pathologist did not identify any osteomyelitis. 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 Is promised today and aggressive debridement of the right foot and a total contact cast which surprisingly she seemed to tolerate quite well 03/13/2019 upon evaluation today patient appears to be doing well with her total contact cast she is here for the first cast  change. She had no areas of rubbing or any other complications at this point. Overall things are doing quite well. 2/26; the patient was kindly seen by Dr. Orvan Falconer of infectious disease on 2/25. She is now on IV vancomycin PICC line placement in 2 days and oral Flagyl. This was in response to her MRI showing osteomyelitis of the left calcaneus concerning for osteomyelitis. We have been putting her right leg in a total contact cast. Our nurses report drainage. She is tolerating the total contact cast well. 3/4; the patient is started her IV antibiotics and is taking IV vancomycin and oral Flagyl. She appears to be tolerating these both well. This is for osteomyelitis involving the left calcaneus. The area on the right in the setting of bilateral Charcot deformities did not have any bone infection on MRI. We put a total contact cast on her with silver alginate as the primary dressing and she returns today in follow-up. Per our intake nurse there was too much drainage to leave this on all week therefore we will bring her back for an early change 3/8; follow-up total contact cast she is still having a lot of drainage probably too much drainage from the right foot wound will week with the same cast. She will be back on Thursday. The area on the left looks somewhat better 3/11; patient here for a total contact cast change. 3/15; patient came in for wound care evaluation. She is still on IV vancomycin and oral Flagyl for osteomyelitis in the left foot. The area on the right foot we are putting in a total contact cast. 3/18 for total contact cast change on the right foot 3/22; the patient was here for wound review today. The area on the right foot is slightly smaller in terms of surface area. However the surface of the wound is very gritty and hyper granulated. More problematically the area on the plantar left foot has increased depth indeed in the center of this it probes to bone. We have been using  Hydrofera Blue in both areas. She is on antibiotics as prescribed by infectious disease IV vancomycin and oral Flagyl 3/25; the patient is in for total contact cast change. Our intake nurse was concerned about the amount of drainage which soaked right to the multiple layers we are putting on this. Wondered about options. The wound bed actually looks as healthy as this is looked although there may not be a lot of change in dimensions things are looking a lot better. If we are going to heal this woman's wound which is in the middle of her right Charcot foot this is going to need to be offloaded. There are not many alternatives 3/29; still not a lot of improvement although the surface of the right wound looks better. We have been using Hydrofera Blue 04/17/2019 upon evaluation today patient appears to be doing well with a total contact cast. With actually having to change this twice a week. She saw [name] for wound care earlier in the week this is for the cast change today. That is due to the fact that she has significant drainage. Overall the wound is been looking excellent however. 4/5; we continue  to put a total contact cast on this patient with Hydrofera Blue. Still a lot of drainage which precludes a simple weekly change or changing this twice a week. 4/8; total contact cast changed today. Apparently the wound looks better per our intake nurse 4/12; patient here for wound care evaluation. Wounds on the right foot have not changed in dimension none about a month left foot looks similar. Apparently her antibiotics that we are giving her for osteomyelitis in the left foot complete on Wednesday. We have been using Hydrofera Blue on the right foot under a total contact cast. Nurses report greenish drainage although I think this may be discoloration from the [facility] 4/15; back for cast change wounds look quite good although still moderate amount of drainage. Total contact  cast reapplied 4/19;. Wound evaluation. Wound on the right foot is smaller surface looks healthy. There is still the divot in the middle part of this wound but I could not feel any bone. Area on the left foot also looks better She has completed her antibiotics but still has the PICC line in. 4/23; patient comes back for a total contact cast change this was done in the standard fashion no issues 4/26; wound measures slightly larger on the right foot which is disappointing. She sees Dr. Orvan Falconer with regards to her antibiotics on Wednesday. I believe she is on vancomycin IV and oral Flagyl. Electronic Signature(s) Signed: 05/13/2019 5:40:04 PM By: Baltazar Najjar MD Entered By: Baltazar Najjar on 05/13/2019 15:12:29 -------------------------------------------------------------------------------- Physical Exam Details Patient Name: Date of Service: [name] 05/13/2019 2:30 PM Medical Record [id number] Patient Account Number: [id number] Date of Birth/Sex: Treating RN: 03-14-1955 (63 y.o. [name] Primary Care Provider: Chevis Pretty Other Clinician: Referring Provider: Treating Provider/Extender:Kerah Hardebeck, [name], [facility] Weeks in Treatment: 15 Constitutional Sitting or standing Blood Pressure is within target range for patient.. Pulse regular and within target range for patient.Marland Kitchen Respirations regular, non-labored and within target range.. Temperature is normal and within the target range for the patient.Marland Kitchen Appears in no distress. Notes Wound exam; the wound on the right foot had surrounding callus thick skin on the circumference hyper granulated tissue with a fibrinous cover. First of all I used a #5 curette to remove the callus and thick skin from the wound edge and then numbered #10 blade to knock down the hyper granulated tissue hemostasis with silver nitrate. The area on the left foot I think is made some progress but not a lot of difference  from last week. Electronic Signature(s) Signed: 05/13/2019 5:40:04 PM By: Baltazar Najjar MD Entered By: Baltazar Najjar on 05/13/2019 15:14:25 -------------------------------------------------------------------------------- Physician Orders Details Patient Name: Date of Service: [name] 05/13/2019 2:30 PM Medical Record [id number] Patient Account Number: [id number] Date of Birth/Sex: Treating RN: July 29, 1955 (63 y.o. [name] Primary Care Provider: Chevis Pretty Other Clinician: Referring Provider: Treating Provider/Extender:Mazelle Huebert, [name], [facility] Weeks in Treatment: 15 Verbal / Phone Orders: No Diagnosis Coding ICD-10 Coding Code Description E11.621 Type 2 diabetes mellitus with foot ulcer L97.524 Non-pressure chronic ulcer of other part of left foot with necrosis of bone L97.511 Non-pressure chronic ulcer of other part of right foot limited to breakdown of skin L97.514 Non-pressure chronic ulcer of other part of right foot with necrosis of bone E11.42 Type 2 diabetes mellitus with diabetic polyneuropathy M14.671 Charcot's joint, right ankle and foot M14.672 Charcot's joint, left ankle and foot 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 - MD  visit Return Appointment in: - Thursday or Friday for cast change Dressing Change Frequency Wound #10 Left,Lateral,Plantar Foot Change Dressing every other day. Wound #9 Right,Medial Foot Do not change entire dressing for one week. Wound Cleansing May shower with protection. Primary Wound Dressing Wound #10 Left,Lateral,Plantar Foot Calcium Alginate with Silver Wound #9 Right,Medial Foot Hydrofera Blue Secondary Dressing Wound #10 Left,Lateral,Plantar Foot Kerlix/Rolled Gauze Dry Gauze Other: - felt callous pad Wound #9 Right,Medial Foot Kerlix/Rolled Gauze Zetuvit or Kerramax Drawtex Carboflex Off-Loading Total  Contact Cast to Right Lower Extremity Open toe surgical shoe to: - add felt to sandal also Electronic Signature(s) Signed: 05/13/2019 5:33:33 PM By: Zandra Abts RN, BSN Signed: 05/13/2019 5:40:04 PM By: Baltazar Najjar MD Entered By: Zandra Abts on 05/13/2019 15:06:28 -------------------------------------------------------------------------------- Problem List Details Patient Name: Date of Service: [name]. 05/13/2019 2:30 PM Medical Record [id number] Patient Account Number: [id number] Date of Birth/Sex: Treating RN: 12/10/1955 (64 y.o. [name] Primary Care Provider: Chevis Pretty Other Clinician: Referring Provider: Treating Provider/Extender:Novis League, [name], [facility] Weeks in Treatment: 15 Active Problems ICD-10 Encounter Code Description Active Date MDM Diagnosis E11.621 Type 2 diabetes mellitus with foot ulcer 01/28/2019 No Yes L97.524 Non-pressure chronic ulcer of other part of left foot with 01/28/2019 No Yes necrosis of bone L97.511 Non-pressure chronic ulcer of other part of right foot 01/28/2019 No Yes limited to breakdown of skin L97.514 Non-pressure chronic ulcer of other part of right foot with 01/28/2019 No Yes necrosis of bone E11.42 Type 2 diabetes mellitus with diabetic polyneuropathy 01/28/2019 No Yes M14.671 Charcot's joint, right ankle and foot 01/28/2019 No Yes M14.672 Charcot's joint, left ankle and foot 01/28/2019 No Yes 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 05/13/2019 2:30 PM Medical Record [id number] Patient Account Number: [id number] Date of  Birth/Sex: Treating RN: 1955-09-25 (64 y.o. [name] Primary Care Provider: Chevis Pretty Other Clinician: Referring Provider: Treating Provider/Extender:Jessiah Wojnar, [name], [facility] Weeks in Treatment: 15 Subjective History of Present Illness (HPI) 64 yrs old bf here for follow up recurrent left charcot foot ulcer. She has DM. She is not a smoker. She states a blister developed 3 months ago at site of previous breakdown from 1 year ago. It was debrided at the podiatrist's office. She went to the ER and then sent here. She denies pain. Xray was negative for osteo. Culture from this clinic negative. 09/08/14 Referred by [name] to [name] for Charcot foot. Seen by him and MRI scheduled 09/19/14. Prior silver collagen but this was not ordered last visit, patient has been rinsing with saline alone. Re institute silver collagen every other day. F/u 2 weeks with [name] as Labor Day holiday. Supplies ordered. 09/25/14; this is a patient with a Charcot foot on the left. she has a wound over the plantar aspect of her left calcaneus. She has been seen by [name] of orthopedic surgery and has had an MRI. She is apparently going within the next week or 2 for corrective surgery which will involve an external fixator. I don't have any of the details of this. 10/06/14; The patient is a 64 yrs old bf with a left Charcot foot and ulcer on the plantar. 12/01/14 Returns post surgery with [name]. Has follow up visit later this week with him, currently in  facility and NWB over this extremity. States no drainage from foot and no current wound care. On exam callus and scab in place, suspect healed. Has external fixator in place. Will defer to [name] for management, ok to place moisturizing cream over scabs and callus and she will call if she requires follow up here. READMISSION 01/28/2019 Patient was in the clinic in 2016 for a wound on the plantar aspect of her left  calcaneus. Predominantly cared for by [name] she was referred to [name] and had corrective surgery for the position of her left ankle I believe. She had previously been here in 2015 and then prior to that in 2011 2012 that I do not have these records. More recently she has developed fairly extensive wounds on the right medial foot, left lateral foot and a small area on the right medial great toe. Right medial foot wound has been there for 6 to 7 months, left foot wound for 2 to 3 months and the right great toe only over the last few weeks. She has been followed by [name] at Dr. Laverta [place] office it sounds as though she has been undergoing callus paring and silver nitrate. The patient has been washing these off with soap and water and plan applying dry dressing Past medical history, type 2 diabetes well controlled with a recent hemoglobin A1c of 7 on 11/16, type 2 diabetes with peripheral neuropathy, previous left Charcot joint surgery bilateral Charcot joints currently, stage III chronic renal failure, hypertension, obstructive sleep apnea, history of MRSA and gastroesophageal reflux. 1/18; this is a patient with bilateral Charcot feet. Plain x-rays that I did of both of these wounds that are either at bone or precariously close to bone were done. On the right foot this showed possible lytic destruction involving the anterior aspect of the talus which may represent a wound osteomyelitis. An MRI was recommended. On the left there was no definite lytic destruction although the wound is deep undermining's and I think probably requires an MRI on this basis in any case 1/25; patient was supposed to have her MRI last Friday of her bilateral feet however they would not do it because there was silver alginate in her dressings, will replace with calcium alginate today and will see if we can get her done today 2/1; patient did not get her MRIs done last week because of issues with the MRI  department receiving orders to do bilateral feet. She has 2 deep wounds in the setting of Charcot feet deformities bilaterally. Both of them at one point had exposed bone although I had trouble demonstrating that today. I am not sure that we can offload these very aggressively as I watched her leave the clinic last week her gait is very narrow and unsteady. 2/15; the MRI of her right foot did not show osteomyelitis potentially osteonecrosis of the talus. 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 The wounds are a large wound on the right medial foot and on the left a substantial wound on the left lateral plantar calcaneus. We have been using silver alginate. Ultimately I think we need to consider a total contact cast on the right while we work through the possibility of osteomyelitis in the left. I watched her walk out with her crutches I have some trepidation about the ability to walk with the cast on her right foot. Nevertheless with her Charcot deformities I do not think there is a way to  heal these short of offloading them aggressively. 2/22; the culture of the bone in the left foot that I did last week showed a few Citrobacter Koseri as well as some streptococcal species. In my attempted bone for pathology did not actually yield a lot of bone the pathologist did not identify any osteomyelitis. 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 Is promised today and aggressive debridement of the right foot and a total contact cast which surprisingly she seemed to tolerate quite well 03/13/2019 upon evaluation today patient appears to be doing well with her total contact cast she is here for the first cast change. She had no areas of rubbing or any other complications at this point. Overall things are doing quite  well. 2/26; the patient was kindly seen by Dr. Orvan Falconer of infectious disease on 2/25. She is now on IV vancomycin PICC line placement in 2 days and oral Flagyl. This was in response to her MRI showing osteomyelitis of the left calcaneus concerning for osteomyelitis. We have been putting her right leg in a total contact cast. Our nurses report drainage. She is tolerating the total contact cast well. 3/4; the patient is started her IV antibiotics and is taking IV vancomycin and oral Flagyl. She appears to be tolerating these both well. This is for osteomyelitis involving the left calcaneus. The area on the right in the setting of bilateral Charcot deformities did not have any bone infection on MRI. We put a total contact cast on her with silver alginate as the primary dressing and she returns today in follow-up. Per our intake nurse there was too much drainage to leave this on all week therefore we will bring her back for an early change 3/8; follow-up total contact cast she is still having a lot of drainage probably too much drainage from the right foot wound will week with the same cast. She will be back on Thursday. The area on the left looks somewhat better 3/11; patient here for a total contact cast change. 3/15; patient came in for wound care evaluation. She is still on IV vancomycin and oral Flagyl for osteomyelitis in the left foot. The area on the right foot we are putting in a total contact cast. 3/18 for total contact cast change on the right foot 3/22; the patient was here for wound review today. The area on the right foot is slightly smaller in terms of surface area. However the surface of the wound is very gritty and hyper granulated. More problematically the area on the plantar left foot has increased depth indeed in the center of this it probes to bone. We have been using Hydrofera Blue in both areas. She is on antibiotics as prescribed by infectious disease IV vancomycin and oral  Flagyl 3/25; the patient is in for total contact cast change. Our intake nurse was concerned about the amount of drainage which soaked right to the multiple layers we are putting on this. Wondered about options. The wound bed actually looks as healthy as this is looked although there may not be a lot of change in dimensions things are looking a lot better. If we are going to heal this woman's wound which is in the middle of her right Charcot foot this is going to need to be offloaded. There are not many alternatives 3/29; still not a lot  of improvement although the surface of the right wound looks better. We have been using Hydrofera Blue 04/17/2019 upon evaluation today patient appears to be doing well with a total contact cast. With actually having to change this twice a week. She saw [name] for wound care earlier in the week this is for the cast change today. That is due to the fact that she has significant drainage. Overall the wound is been looking excellent however. 4/5; we continue to put a total contact cast on this patient with Hydrofera Blue. Still a lot of drainage which precludes a simple weekly change or changing this twice a week. 4/8; total contact cast changed today. Apparently the wound looks better per our intake nurse 4/12; patient here for wound care evaluation. Wounds on the right foot have not changed in dimension none about a month left foot looks similar. Apparently her antibiotics that we are giving her for osteomyelitis in the left foot complete on Wednesday. We have been using Hydrofera Blue on the right foot under a total contact cast. Nurses report greenish drainage although I think this may be discoloration from the [facility] 4/15; back for cast change wounds look quite good although still moderate amount of drainage. Total contact cast reapplied 4/19;. Wound evaluation. Wound on the right foot is smaller surface looks healthy. There is still the divot in the  middle part of this wound but I could not feel any bone. Area on the left foot also looks better She has completed her antibiotics but still has the PICC line in. 4/23; patient comes back for a total contact cast change this was done in the standard fashion no issues 4/26; wound measures slightly larger on the right foot which is disappointing. She sees Dr. Orvan Falconer with regards to her antibiotics on Wednesday. I believe she is on vancomycin IV and oral Flagyl. Objective Constitutional Sitting or standing Blood Pressure is within target range for patient.. Pulse regular and within target range for patient.Marland Kitchen Respirations regular, non-labored and within target range.. Temperature is normal and within the target range for the patient.Marland Kitchen Appears in no distress. Vitals Time Taken: 2:34 PM, Height: 68 in, Weight: 265 lbs, BMI: 40.3, Temperature: 97.5 F, Pulse: 79 bpm, Respiratory Rate: 18 breaths/min, Blood Pressure: 139/54 mmHg, Capillary Blood Glucose: 130 mg/dl. General Notes: CBG per patient General Notes: Wound exam; the wound on the right foot had surrounding callus thick skin on the circumference hyper granulated tissue with a fibrinous cover. First of all I used a #5 curette to remove the callus and thick skin from the wound edge and then numbered #10 blade to knock down the hyper granulated tissue hemostasis with silver nitrate. ooThe area on the left foot I think is made some progress but not a lot of difference from last week. Integumentary (Hair, Skin) Wound #10 status is Open. Original cause of wound was Blister. The wound is located on the Left,Lateral,Plantar Foot. The wound measures 1.4cm length x 2cm width x 1.6cm depth; 2.199cm^2 area and 3.519cm^3 volume. There is muscle and Fat Layer (Subcutaneous Tissue) Exposed exposed. There is no tunneling or undermining noted. There is a medium amount of serosanguineous drainage noted. The wound margin is thickened. There is large  (67- 100%) red, pink granulation within the wound bed. There is no necrotic tissue within the wound bed. Wound #9 status is Open. Original cause of wound was Blister. The wound is located on the Right,Medial Foot. The wound measures 3.5cm length x 2.9cm width  x 0.1cm depth; 7.972cm^2 area and 0.797cm^3 volume. There is Fat Layer (Subcutaneous Tissue) Exposed exposed. There is no tunneling or undermining noted. There is a large amount of serosanguineous drainage noted. The wound margin is thickened. There is large (67-100%) red, pink, hyper - granulation within the wound bed. There is no necrotic tissue within the wound bed. 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 There was a Excisional Skin/Subcutaneous Tissue Debridement with a total area of 10.15 sq cm performed by Ricard Dillon., MD. With the following instrument(s): Blade, and Curette to remove Viable and Non-Viable tissue/material. Material removed includes Callus, Subcutaneous Tissue, Slough, and Skin: Epidermis after achieving pain control using Other (Benzocaine 20%). No specimens were taken. A time out was conducted at 15:00, prior to the start of the procedure. A Moderate amount of bleeding was controlled with Silver Nitrate. The procedure was tolerated well with a pain level of 0 throughout  and a pain level of 0 following the procedure. Post Debridement Measurements: 3.5cm length x 2.9cm width x 0.1cm depth; 0.797cm^3 volume. Character of Wound/Ulcer Post Debridement is improved. Severity of Tissue Post Debridement is: Fat layer exposed. Post procedure Diagnosis Wound #9: Same as Pre-Procedure Pre-procedure diagnosis of Wound #9 is a Diabetic Wound/Ulcer of the Lower Extremity located on the Right,Medial Foot . There was a Total Contact Cast Procedure by Ricard Dillon., MD. Post procedure Diagnosis Wound #9: Same as Pre-Procedure Plan Follow-up Appointments: Return Appointment in 1 week. - MD visit Return Appointment in: - Thursday or Friday for cast change Dressing Change Frequency: Wound #10 Left,Lateral,Plantar Foot: Change Dressing every other day. Wound #9 Right,Medial Foot: Do not change entire dressing for one week. Wound Cleansing: May shower with protection. Primary Wound Dressing: Wound #10 Left,Lateral,Plantar Foot: Calcium Alginate with Silver Wound #9 Right,Medial Foot: Hydrofera Blue Secondary Dressing: Wound #10 Left,Lateral,Plantar Foot: Kerlix/Rolled Gauze Dry Gauze Other: - felt callous pad Wound #9 Right,Medial Foot: Kerlix/Rolled Gauze Zetuvit or Kerramax Drawtex Carboflex Off-Loading: Total Contact Cast to Right Lower Extremity Open toe surgical shoe to: - add felt to sandal also 1. Change the dressing on the right foot the Hydrofera Blue again to help with the hyper granulated tissue disappointing that there is no change in surface area 2. Continue with silver alginate on the left foot although may want to try silver collagen on this if there is no improvement in surface area 3. Sees Dr. Megan Salon of infectious disease on Wednesday. I do not have any information on the lab work including inflammatory markers presumably they are being sent to him by home health Electronic Signature(s) Signed: 05/13/2019 5:40:04 PM By: Linton Ham  MD Entered By: Linton Ham on 05/13/2019 15:15:43 -------------------------------------------------------------------------------- Total Contact Cast Details Patient Name: Date of Service: [name], [name] 05/13/2019 2:30 PM Medical Record [id number] Patient Account Number: [id number] Date of Birth/Sex: Treating RN: [birth year] (64 y.o. [name] Primary Care Provider: Dewayne Hatch  Other Clinician: Referring Provider: Chevis Pretty Treating Provider/Extender:Marji Kuehnel, Lamar Sprinkles in Treatment: 15 Total Contact Cast Applied for Wound Assessment: Wound #9 Right,Medial Foot Performed By: Physician [name], MD Post Procedure Diagnosis Same as Pre-procedure Electronic Signature(s) Signed: 05/13/2019 5:40:04 PM By: Baltazar Najjar MD Entered By: Baltazar Najjar on 05/13/2019 15:11:18 -------------------------------------------------------------------------------- SuperBill Details Patient Name: Date of Service: [name] 05/13/2019 Medical Record [id number] Patient Account Number: [id number] Date of Birth/Sex: Treating RN: 03/21/1955 (63 y.o. [name] Primary Care Provider: Chevis Pretty Other Clinician: Referring Provider: Treating Provider/Extender:Ala Kratz, [name], [facility] Weeks in Treatment: 15 Diagnosis Coding ICD-10 Codes Code Description E11.621 Type 2 diabetes mellitus with foot ulcer L97.524 Non-pressure chronic ulcer of other part of left foot with necrosis of bone L97.511 Non-pressure chronic ulcer of other part of right foot limited to breakdown of skin L97.514 Non-pressure chronic ulcer of other part of right foot with necrosis of bone E11.42 Type 2 diabetes mellitus with diabetic polyneuropathy M14.671 Charcot's joint, right ankle and foot M14.672 Charcot's joint, left ankle and foot 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, left ankle and foot Facility Procedures CPT4 Code  Description: [id number] 11042 - DEB SUBQ TISSUE 20 SQ CM/< ICD-10 Diagnosis Description L97.514 Non-pressure chronic ulcer of other part of right foot with Modifier: necrosis of bo Quantity: 1 ne Physician Procedures CPT4 Code Description: [id number] 11042 - WC PHYS SUBQ TISS 20 SQ CM ICD-10 Diagnosis Description L97.514 Non-pressure chronic ulcer of other part of right foot with Modifier: necrosis of bon Quantity: 1 e Electronic Signature(s) Signed: 05/13/2019 5:40:04 PM By: Baltazar Najjar MD Entered By: Baltazar Najjar on 05/13/2019 15:16:13

## 2019-05-14 ENCOUNTER — Telehealth: Payer: Self-pay

## 2019-05-14 NOTE — Telephone Encounter (Signed)
COVID-19 Pre-Screening Questions:05/14/19  Do you currently have a fever (>100 F), chills or unexplained body aches? NO   Are you currently experiencing new cough, shortness of breath, sore throat, runny nose? NO .  Have you recently travelled outside the state of [place] in the last 14 days? NO .  Have you been in contact with someone that is currently pending confirmation of Covid19 testing or has been confirmed to have the Covid19 virus?  NO  **If the patient answers NO to ALL questions -  advise the patient to please call the clinic before coming to the office should any symptoms develop.     

## 2019-05-14 NOTE — Progress Notes (Signed)
VICTORYA, HILLMAN ([id number]) Visit Report for 05/13/2019 Arrival Information Details Patient Name: Date of Service: [name], [name] 05/13/2019 2:30 PM Medical Record [id number] Patient Account Number: [id number] Date of Birth/Sex: Treating RN: 12/01/1955 (64 y.o. [name] Primary Care Rowe Warman: Chevis Pretty Other Clinician: Referring Damyia Strider: Treating Karlye Ihrig/Extender:Robson, [name], [facility] Weeks in Treatment: 15 Visit Information History Since Last Visit All ordered tests and consults were No Patient Arrived: Crutches completed: Arrival Time: 14:33 Added or deleted any medications: No Accompanied By: self Any new allergies or adverse reactions: No Transfer Assistance: None Had a fall or experienced change in No Patient Identification Verified: Yes activities of daily living that may affect Secondary Verification Process Completed: Yes risk of falls: Patient Requires Transmission-Based No Signs or symptoms of abuse/neglect since No Precautions: last visito Patient Has Alerts: No Hospitalized since last visit: No Implantable device outside of the clinic No excluding cellular tissue based products placed in the center since last visit: Has Dressing in Place as Prescribed: Yes Has Footwear/Offloading in Place as Yes Prescribed: Right: Total Contact Cast Pain Present Now: No Electronic Signature(s) Signed: 05/14/2019 5:28:23 PM By: Yevonne Pax RN Entered By: Yevonne Pax on 05/13/2019 14:33:59 -------------------------------------------------------------------------------- Encounter Discharge Information Details Patient Name: Date of Service: [name] 05/13/2019 2:30 PM Medical Record [id number] Patient Account Number: [id number] Date of Birth/Sex: Treating RN: [birth year] (64 y.o. [name] Primary Care Laurynn Mccorvey: Chevis Pretty Other Clinician: Referring Jamel Dunton: Treating  Aseel Truxillo/Extender:Robson, [name], [facility] Weeks in Treatment: 15 Encounter Discharge Information Items Post Procedure Vitals Discharge Condition: Stable Temperature (F): 97.5 Ambulatory Status: Crutches Pulse (bpm): 79 Discharge Destination: Home Respiratory Rate (breaths/min): 18 Transportation: Private Auto Blood Pressure (mmHg): 139/54 Accompanied By: self Schedule Follow-up Appointment: Yes Clinical Summary of Care: Electronic Signature(s) Signed: 05/13/2019 5:14:33 PM By: Shawn Stall Entered By: Shawn Stall on 05/13/2019 15:26:40 -------------------------------------------------------------------------------- Lower Extremity Assessment Details Patient Name: Date of Service: KATLYN, MULDREW 05/13/2019 2:30 PM Medical Record [id number] Patient Account Number: [id number] Date of Birth/Sex: Treating RN: Nov 26, 1955 (63 y.o. [name] Primary Care Siobhan Zaro: Chevis Pretty Other Clinician: Referring Shanele Nissan: Treating Chiana Wamser/Extender:Robson, [name], [facility] Weeks in Treatment: 15 Edema Assessment Assessed: [Left: No] [Right: No] Edema: [Left: Yes] [Right: Yes] Calf Left: Right: Point of Measurement: 43 cm From Medial Instep 38.5 cm 37 cm Ankle Left: Right: Point of Measurement: 12 cm From Medial Instep 24 cm 28 cm Electronic Signature(s) Signed: 05/14/2019 5:28:23 PM By: Yevonne Pax RN Entered By: Yevonne Pax on 05/13/2019 14:44:22 -------------------------------------------------------------------------------- Multi Wound Chart Details Patient Name: Date of Service: [name] 05/13/2019 2:30 PM Medical Record [id number] Patient Account Number: [id number] Date of Birth/Sex: Treating RN: [birth year] (63 y.o. [name] Primary Care Analia Zuk: Chevis Pretty Other Clinician: Referring Khylon Davies: Treating Fitzgerald Dunne/Extender:Robson, [name], [facility] Weeks in Treatment:  15 Vital Signs Height(in): 68 Capillary Blood 130 Weight(lbs): 265 Glucose(mg/dl): Body Mass Index(BMI): 40 Pulse(bpm): 79 Temperature(F): 97.5 Blood Pressure(mmHg):139/54 Respiratory 18 Rate(breaths/min): Photos: [10:No Photos] [9:No Photos] [N/A:N/A] Wound Location: [10:Left, Lateral, Plantar Foot Right, Medial Foot] [N/A:N/A] Wounding Event: [10:Blister] [9:Blister] [N/A:N/A] Primary Etiology: [10:Diabetic Wound/Ulcer of the Diabetic Wound/Ulcer of the N/A Lower Extremity] [9:Lower Extremity] Comorbid History: [10:Glaucoma, Chronic sinus Glaucoma, Chronic sinus N/A problems/congestion, Anemia, Sleep Apnea, Hypertension, Type II Diabetes, Rheumatoid Arthritis, Neuropathy] [9:problems/congestion, Anemia, Sleep Apnea, Hypertension, Type II  Diabetes, Rheumatoid Arthritis, Neuropathy] Date Acquired: [10:11/18/2018] [9:06/18/2018] [N/A:N/A] Weeks of Treatment: [10:15] [9:15] [N/A:N/A] Wound Status: [10:Open] [9:Open] [N/A:N/A] Measurements L x W x D 1.4x2x1.6 [9:3.5x2.9x0.1] [N/A:N/A] (cm) Area (  cm) : [10:2.199] [9:7.972] [N/A:N/A] Volume (cm) : [10:3.519] [9:0.797] [N/A:N/A] % Reduction in Area: [10:-25.00%] [9:27.50%] [N/A:N/A] % Reduction in Volume: -33.30% [9:91.90%] [N/A:N/A] Classification: [10:Grade 2] [9:Grade 2] [N/A:N/A] Exudate Amount: [10:Medium] [9:Large] [N/A:N/A] Exudate Type: [10:Serosanguineous] [9:Serosanguineous] [N/A:N/A] Exudate Color: [10:red, brown] [9:red, brown] [N/A:N/A] Wound Margin: [10:Thickened] [9:Thickened] [N/A:N/A] Granulation Amount: [10:Large (67-100%)] [9:Large (67-100%)] [N/A:N/A] Granulation Quality: [10:Red, Pink] [9:Red, Pink, Hyper- granulation] [N/A:N/A] Necrotic Amount: [10:None Present (0%)] [9:None Present (0%)] [N/A:N/A] Exposed Structures: [10:Fat Layer (Subcutaneous Fat Layer (Subcutaneous N/A Tissue) Exposed: Yes Muscle: Yes Fascia: No Tendon: No Joint: No Bone: No] [9:Tissue) Exposed: Yes Fascia: No Tendon: No Muscle: No Joint: No  Bone: No] Epithelialization: [10:None] [9:Small (1-33%)] [N/A:N/A] Debridement: [10:N/A] [9:Debridement - Excisional] [N/A:N/A] Pre-procedure [10:N/A] [9:15:00] [N/A:N/A] Verification/Time Out Taken: Pain Control: [10:N/A] [9:Other] [N/A:N/A] Tissue Debrided: [10:N/A] [9:Callus, Subcutaneous, Slough] [N/A:N/A] Level: [10:N/A] [9:Skin/Subcutaneous Tissue] [N/A:N/A] Debridement Area (sq cm):N/A [9:10.15] [N/A:N/A] Instrument: [10:N/A] [9:Blade, Curette] [N/A:N/A] Bleeding: [10:N/A] [9:Moderate] [N/A:N/A] Hemostasis Achieved: [10:N/A] [9:Silver Nitrate] [N/A:N/A] Procedural Pain: [10:N/A] [9:0] [N/A:N/A] Post Procedural Pain: [10:N/A] [9:0] [N/A:N/A] Debridement Treatment N/A [9:Procedure was tolerated] [N/A:N/A] Response: [9:well] Post Debridement [10:N/A] [9:3.5x2.9x0.1] [N/A:N/A] Measurements L x W x D (cm) Post Debridement [10:N/A] [9:0.797] [N/A:N/A] Volume: (cm) Procedures Performed: N/A [9:Debridement Total Contact Cast] [N/A:N/A] Treatment Notes Electronic Signature(s) Signed: 05/13/2019 5:33:33 PM By: Zandra Abts RN, BSN Signed: 05/13/2019 5:40:04 PM By: Baltazar Najjar MD Entered By: Baltazar Najjar on 05/13/2019 15:10:55 -------------------------------------------------------------------------------- Multi-Disciplinary Care Plan Details Patient Name: Date of Service: [name] 05/13/2019 2:30 PM Medical Record [id number] Patient Account Number: [id number] Date of Birth/Sex: Treating RN: Oct 26, 1955 (64 y.o. [name] Primary Care Ashby Leflore: Chevis Pretty Other Clinician: Referring Deforest Maiden: Treating Witten Certain/Extender:Robson, [name], [facility] Weeks in Treatment: 15 Active Inactive Wound/Skin Impairment Nursing Diagnoses: Impaired tissue integrity Knowledge deficit related to ulceration/compromised skin integrity Goals: Patient/caregiver will verbalize understanding of skin care regimen Date Initiated: 01/28/2019 Target  Resolution Date: 06/14/2019 Goal Status: Active Interventions: Assess patient/caregiver ability to obtain necessary supplies Assess patient/caregiver ability to perform ulcer/skin care regimen upon admission and as needed Assess ulceration(s) every visit Provide education on ulcer and skin care Notes: Electronic Signature(s) Signed: 05/13/2019 5:33:33 PM By: Zandra Abts RN, BSN Entered By: Zandra Abts on 05/13/2019 14:57:20 -------------------------------------------------------------------------------- Pain Assessment Details Patient Name: Date of Service: [name] 05/13/2019 2:30 PM Medical Record [id number] Patient Account Number: [id number] Date of Birth/Sex: Treating RN: 29-Aug-1955 (64 y.o. [name] Primary Care Lizzeth Meder: Chevis Pretty Other Clinician: Referring Chassie Pennix: Treating Wacey Zieger/Extender:Robson, [name], [facility] Weeks in Treatment: 15 Active Problems Location of Pain Severity and Description of Pain Patient Has Paino No Site Locations Pain Management and Medication Current Pain Management: Electronic Signature(s) Signed: 05/14/2019 5:28:23 PM By: Yevonne Pax RN Entered By: Yevonne Pax on 05/13/2019 14:34:46 -------------------------------------------------------------------------------- Patient/Caregiver Education Details Patient Name: Date of Service: [name] 4/26/2021andnbsp2:30 PM Medical Record Patient Account Number: [id number] Number: Treating RN: Zandra Abts Date of Birth/Gender: 02/25/1955 (63 y.o. F) Other Clinician: Primary Care Physician: Chevis Pretty Treating Physician/Extender:Robson, Casimiro Needle Referring Physician: Rozetta Nunnery in Treatment: 15 Education Assessment Education Provided To: Patient Education Topics Provided Wound/Skin Impairment: Methods: Explain/Verbal Responses: State content correctly Electronic Signature(s) Signed: 05/13/2019  5:33:33 PM By: Zandra Abts RN, BSN Entered By: Zandra Abts on 05/13/2019 14:57:31 -------------------------------------------------------------------------------- Wound Assessment Details Patient Name: Date of Service: [name] 05/13/2019 2:30 PM Medical Record [id number] Patient Account Number: [id number] Date of Birth/Sex: Treating RN: 1955-02-06 (64 y.o. [name] Primary Care Robinson Brinkley: Chevis Pretty  Other Clinician: Referring Yeily Link: Treating Carmilla Granville/Extender:Robson, [name], [facility] Weeks in Treatment: 15 Wound Status Wound Number: 10 Primary Diabetic Wound/Ulcer of the Lower Extremity Wound Location: Left, Lateral, Plantar Foot Etiology: Wounding Event: Blister Wound Open Date Acquired: 11/18/2018 Status: Weeks Of Treatment:15 ComorbidGlaucoma, Chronic sinus problems/congestion, Clustered Wound: No History: Anemia, Sleep Apnea, Hypertension, Type II Diabetes, Rheumatoid Arthritis, Neuropathy Photos Photo Uploaded By: Mikeal Hawthorne on 05/14/2019 14:10:15 Wound Measurements Length: (cm) 1.4 Width: (cm) 2 Depth: (cm) 1.6 Area: (cm) 2.199 Volume: (cm) 3.519 Wound Description Classification: Grade 2 Wound Margin: Thickened Exudate Amount: Medium Exudate Type: Serosanguineous Exudate Color: red, brown Wound Bed Granulation Amount: Large (67-100%) Granulation Quality: Red, Pink Necrotic Amount: None Present (0%) After Cleansing: No brino No Exposed Structure posed: No (Subcutaneous Tissue) Exposed: Yes posed: No posed: Yes osis of Muscle: No osed: No sed: No % Reduction in Area: -25% % Reduction in Volume: -33.3% Epithelialization: None Tunneling: No Undermining: No Foul Odor Slough/Fi Fascia Ex Fat Layer Tendon Ex Muscle Ex Necr Joint Exp Bone Expo Treatment Notes Wound #10 (Left, Lateral, Plantar Foot) 1. Cleanse With Wound Cleanser 2. Periwound Care Moisturizing lotion 3. Primary Dressing  Applied Calcium Alginate Ag 4. Secondary Dressing Dry Gauze Roll Gauze 5. Secured With Medipore tape 7. Footwear/Offloading device applied Felt/Foam Surgical shoe Notes stockinette Electronic Signature(s) Signed: 05/14/2019 5:28:23 PM By: Carlene Coria RN Entered By: Carlene Coria on 05/13/2019 14:45:11 -------------------------------------------------------------------------------- Wound Assessment Details Patient Name: Date of Service: SHIRLY, BARTOSIEWICZ 05/13/2019 2:30 PM Medical Record [id number] Patient Account Number: [id number] Date of Birth/Sex: Treating RN: 1955/05/15 (64 y.o. Orvan Falconer Primary Care Dafney Farler: Dewayne Hatch Other Clinician: Referring Samariah Hokenson: Treating Dulcinea Kinser/Extender:Robson, [name], [facility] Weeks in Treatment: 15 Wound Status Wound Number: 9 Primary Diabetic Wound/Ulcer of the Lower Extremity Wound Location: Right, Medial Foot Etiology: Wounding Event: Blister Wound Open Date Acquired: 06/18/2018 Status: Weeks Of Treatment:15 ComorbidGlaucoma, Chronic sinus problems/congestion, Clustered Wound: No History: Anemia, Sleep Apnea, Hypertension, Type II Diabetes, Rheumatoid Arthritis, Neuropathy Photos Photo Uploaded By: Mikeal Hawthorne on 05/14/2019 14:10:16 Wound Measurements Length: (cm) 3.5 % Reductio Width: (cm) 2.9 % Reductio Depth: (cm) 0.1 Epithelial Area: (cm) 7.972 Tunneling Volume: (cm) 0.797 Undermini Wound Description Classification: Grade 2 Wound Margin: Thickened Exudate Amount: Large Exudate Type: Serosanguineous Exudate Color: red, brown Wound Bed Granulation Amount: Large (67-100%) Granulation Quality: Red, Pink, Hyper-granulation Necrotic Amount: None Present (0%) Foul Odor After Cleansing: No Slough/Fibrino No Exposed Structure Fascia Exposed: No Fat Layer (Subcutaneous Tissue) Exposed: Yes Tendon Exposed: No Muscle Exposed: No Joint Exposed: No Bone Exposed: No n in Area:  27.5% n in Volume: 91.9% ization: Small (1-33%) : No ng: No Treatment Notes Wound #9 (Right, Medial Foot) 1. Cleanse With Wound Cleanser Soap and water 2. Periwound Care Moisturizing lotion 3. Primary Dressing Applied Hydrofera Blue 4. Secondary Dressing ABD Pad Dry Gauze Roll Gauze Kerramax/Xtrasorb Other secondary dressing (specify in notes) Drawtex 5. Secured With Medipore tape 7. Footwear/Offloading device applied Total Contact Cast Notes TCC applied by MD. Electronic Signature(s) Signed: 05/14/2019 5:28:23 PM By: Carlene Coria RN Entered By: Carlene Coria on 05/13/2019 14:45:33 -------------------------------------------------------------------------------- Vitals Details Patient Name: Date of Service: [name] 05/13/2019 2:30 PM Medical Record [id number] Patient Account Number: [id number] Date of Birth/Sex: Treating RN: 02/06/1955 (63 y.o. Orvan Falconer Primary Care Saniyyah Elster: Dewayne Hatch Other Clinician: Referring Robt Okuda: Treating Jenavie Stanczak/Extender:Robson, [name], [facility] Weeks in Treatment: 15 Vital Signs Time Taken: 14:34 Temperature (F): 97.5 Height (in): 68 Pulse (bpm): 79 Weight (lbs): 265 Respiratory Rate (  breaths/min): 18 Body Mass Index (BMI): 40.3 Blood Pressure (mmHg): 139/54 Capillary Blood Glucose (mg/dl): 858 Reference Range: 80 - 120 mg / dl Notes CBG per patient Electronic Signature(s) Signed: 05/14/2019 5:28:23 PM By: Yevonne Pax RN Entered By: Yevonne Pax on 05/13/2019 14:34:39

## 2019-05-15 ENCOUNTER — Telehealth: Payer: Self-pay

## 2019-05-15 ENCOUNTER — Ambulatory Visit (INDEPENDENT_AMBULATORY_CARE_PROVIDER_SITE_OTHER): Payer: Medicare Other | Admitting: Internal Medicine

## 2019-05-15 ENCOUNTER — Other Ambulatory Visit: Payer: Self-pay

## 2019-05-15 DIAGNOSIS — M86472 Chronic osteomyelitis with draining sinus, left ankle and foot: Secondary | ICD-10-CM | POA: Diagnosis present

## 2019-05-15 NOTE — Progress Notes (Signed)
[facility]  Patient Active Problem List   Diagnosis Date Noted  . Osteomyelitis of left foot (HCC) 03/02/2019    Priority: High  . Puncture wound of foot 08/08/2018  . Right ankle swelling 02/08/2018  . Obesity 05/18/2017  . Hyperlipidemia 11/12/2014  . Charcot foot due to diabetes mellitus (HCC) 11/06/2014  . CKD (chronic kidney disease) stage 3, GFR 30-59 ml/min 02/02/2013  . GERD (gastroesophageal reflux disease) 03/09/2012  . Routine adult health maintenance 05/26/2010  . Obstructive sleep apnea 05/26/2010  . Normocytic anemia 01/12/2010  . Diabetes mellitus type II, controlled (HCC) 06/17/2009  . Essential hypertension 06/17/2009    Patient's Medications  New Prescriptions   No medications on file  Previous Medications   ACCU-CHEK SOFTCLIX LANCETS LANCETS    Use to check blood glucose up to three times a day. Dx code E 11.9   ACETAMINOPHEN (TYLENOL) 500 MG TABLET    Take 500-1,000 mg by mouth every 6 (six) hours as needed for moderate pain or headache.   ASPIRIN EC 81 MG TABLET    Take 1 tablet (81 mg total) by mouth daily.   ATORVASTATIN (LIPITOR) 40 MG TABLET    TAKE ONE TABLET BY MOUTH DAILY   BIOTIN 1000 MCG TABLET    Take 1,000 mcg by mouth daily.   BRIMONIDINE-TIMOLOL (COMBIGAN) 0.2-0.5 % OPHTHALMIC SOLUTION    Place 1 drop into both eyes every 12 (twelve) hours. Place one drop to both eyes twice daily for glaucoma   CALCIUM-VITAMIN D (OSCAL-500) 500-400 MG-UNIT TABLET    Take 1 tablet by mouth 2 (two) times daily.    CEFTRIAXONE (ROCEPHIN) 10 G INJECTION       CHOLECALCIFEROL (VITAMIN D3) 25 MCG (1000 UT) CAPS    Take 1 capsule by mouth daily.   CYCLOSPORINE (RESTASIS) 0.05 % OPHTHALMIC EMULSION    Place 1 drop into both eyes 2 (two) times daily.   EMOLLIENT (BAG BALM EX)    Apply 1 application topically daily.   FERROUS SULFATE 325 (65 FE) MG TABLET    Take 1 tablet (325 mg total) by mouth daily with breakfast.   GLUCOSE BLOOD  (ACCU-CHEK AVIVA) TEST STRIP    Use to check blood glucose up to three times a day. Dx code E 11.9   [name]    TAKE ONE TABLET BY MOUTH TWICE A DAY   LISINOPRIL (ZESTRIL) 10 MG TABLET    TAKE ONE TABLET BY MOUTH DAILY   METRONIDAZOLE (FLAGYL) 500 MG TABLET    Take 1 tablet (500 mg total) by mouth 3 (three) times daily.   MULTIPLE VITAMIN (MULTIVITAMIN) TABLET    Take 1 tablet by mouth daily.    NETARSUDIL-LATANOPROST (ROCKLATAN) 0.02-0.005 % SOLN    Apply 1 drop to eye daily at 10 pm.   PANTOPRAZOLE (PROTONIX) 20 MG TABLET    1 tablet twice a day   POLYVINYL ALCOHOL-POVIDONE (REFRESH OP)    Place 1 drop into both eyes daily as needed (dry eyes).   VITAMIN B-12 (CYANOCOBALAMIN) 1000 MCG TABLET    Take 1,000 mcg by mouth daily.   VITAMIN C (ASCORBIC ACID) 500 MG TABLET    Take 500 mg by mouth daily.   ZINC 50 MG CAPS    Take 50 mg by mouth daily.   Modified Medications   No medications on file  Discontinued Medications   No medications on file    Subjective: [name] is in for her routine follow-up visit.  She has diabetes and neuropathy complicated by ulcers on both feet for several months.  She suffers with Charcot arthropathy.  Notes indicate that there has been exposed bone on both feet recently. 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 The MRI of her left foot showed cortical irregularity and edema of the calcaneus concerning for osteomyelitis.  She underwent bone biopsy on 03/04/2019.  No organisms were seen on stain but cultures grew Citrobacter koseri, strep mitis/oralis and strep anginosis. Pathology review showed only benign reactive changes.  Her C-reactive protein was elevated at 21.  I elected to start her on IV ceftriaxone and oral metronidazole.  She has now completed 8 weeks of therapy.  She feels like her left foot ulcer is improving.  Dr. Janalyn [name] recent notes from the wound center indicated he is in agreement but  there appears to be concerned that the wound on her right foot is worsening.  She tells me that she has had no problems tolerating her antibiotics for her PICC.  Review of Systems: Review of Systems  Constitutional: Negative for chills, diaphoresis and fever.  Gastrointestinal: Negative for abdominal pain, diarrhea, nausea and vomiting.  Musculoskeletal: Negative for joint pain.    Past Medical History:  Diagnosis Date  . Chronic normocytic anemia   . CKD (chronic kidney disease) stage 3, GFR 30-59 ml/min 02/02/2013  . Diabetes mellitus type II, controlled ([name]) 06/17/2009  . Essential hypertension 06/17/2009  . GERD (gastroesophageal reflux disease)   . Glaucoma   . H/O hiatal hernia   . Hyperlipidemia   . MRSA (methicillin resistant Staphylococcus aureus)   . Obstructive sleep apnea 05/26/2010    Social History   Tobacco Use  . Smoking status: Former Smoker    Packs/day: 0.50    Years: 0.00    Pack years: 0.00    Types: Cigarettes    Quit date: 12/22/1972    Years since quitting: 46.4  . Smokeless tobacco: Never Used  Substance Use Topics  . Alcohol use: No  . Drug use: No    Family History  Problem Relation Age of Onset  . Diabetes Mother   . Hypertension Mother   . Dementia Mother   . Heart disease Father   . Stroke Father   . Diabetes Father   . Hypertension Father   . Gout Father   . Deep vein thrombosis Father   . Breast cancer Cousin   . Breast cancer Cousin     Allergies  Allergen Reactions  . Codeine Itching    swelling in throat  . Pravastatin Other (See Comments)    cramps  . Latex Rash    Objective: Vitals:   05/15/19 1550  BP: (!) 148/83  Pulse: 85  Temp: 98.1 F (36.7 C)  TempSrc: Oral  Weight: 256 lb 6.4 oz (116.3 kg)   Body mass index is 37.86 kg/m.  Physical Exam Constitutional:      Comments: She is in good spirits.  Musculoskeletal:     Comments: The plantar ulcer on her left foot is smaller now.  There is only scant  drainage on her dressing. There is no malodor. I I do not see any exposed bone.  Skin:    Comments: PICC site looks good.      Sedimentation rate 05/13/2019: Elevated at 81     Problem List Items Addressed This Visit      High  Osteomyelitis of left foot (HCC)    Although her sedimentation rate remains elevated I think there is a very good chance that her left heel osteomyelitis has been cured following 8 weeks of her current antibiotic regimen.  I would like to discuss the situation with [name] before making a final decision about optimal duration of antibiotic therapy.  She will follow-up here in 4 weeks.          Cliffton Asters, MD [facility] [phone] pager   [phone] cell 05/15/2019, 4:22 PM

## 2019-05-15 NOTE — Assessment & Plan Note (Signed)
Although her sedimentation rate remains elevated I think there is a very good chance that her left heel osteomyelitis has been cured following 8 weeks of her current antibiotic regimen.  I would like to discuss the situation with [name] before making a final decision about optimal duration of antibiotic therapy.  She will follow-up here in 4 weeks.

## 2019-05-15 NOTE — Telephone Encounter (Signed)
Per MD called [facility] with verbal order to d/c IV antbx, but continue Picc maintenance. Spoke with [name] was able to take order. Md will notify clinical team with new orders. [name]

## 2019-05-16 ENCOUNTER — Encounter (HOSPITAL_BASED_OUTPATIENT_CLINIC_OR_DEPARTMENT_OTHER): Payer: Medicare Other | Admitting: Internal Medicine

## 2019-05-16 DIAGNOSIS — E11621 Type 2 diabetes mellitus with foot ulcer: Secondary | ICD-10-CM | POA: Diagnosis not present

## 2019-05-16 NOTE — Progress Notes (Signed)
[name] ([id number]) Visit Report for 05/16/2019 HPI Details Patient Name: Date of Service: [name], [name] 05/16/2019 12:30 PM Medical Record Number: [id number] Patient Account Number: [id number] Date of Birth/Sex: Treating RN: 1955-11-25 (64 y.o. [name] Primary Care Provider: MA SO UDI, [place] MA LSA DA T Other Clinician: Referring Provider: Treating Provider/Extender: Baltazar Najjar MA SO UDI, ELHA MA LSA DA T Weeks in Treatment: 15 History of Present Illness HPI Description: 64 yrs old bf here for follow up recurrent left charcot foot ulcer. She has DM. She is not a smoker. She states a blister developed 3 months ago at site of previous breakdown from 1 year ago. It was debrided at the podiatrist's office. She went to the ER and then sent here. She denies pain. Xray was negative for osteo. Culture from this clinic negative. 09/08/14 [name] to [name] for Charcot foot. Seen by him and MRI scheduled 09/19/14. Prior silver collagen but this was not ordered last visit, patient has been rinsing with saline alone. Re institute silver collagen every other day. F/u 2 weeks with [name] as Labor Day holiday. Supplies ordered. 09/25/14; this is a patient with a Charcot foot on the left. she has a wound over the plantar aspect of her left calcaneus. She has been seen by [name] of orthopedic surgery and has had an MRI. She is apparently going within the next week or 2 for corrective surgery which will involve an external fixator. I don't have any of the details of this. 10/06/14; The patient is a 64 yrs old bf with a left Charcot foot and ulcer on the plantar. 12/01/14 Returns post surgery with [name]. Has follow up visit later this week with him, currently in facility and NWB over this extremity. States no drainage from foot and no current wound care. On exam callus and scab in place, suspect healed. Has external fixator in place. Will defer to Dr.  [name] for management, ok to place moisturizing cream over scabs and callus and she will call if she requires follow up here. READMISSION 01/28/2019 Patient was in the clinic in 2016 for a wound on the plantar aspect of her left calcaneus. Predominantly cared for by [name] she was referred to [name] and had corrective surgery for the position of her left ankle I believe. She had previously been here in 2015 and then prior to that in 2011 2012 that I do not have these records. More recently she has developed fairly extensive wounds on the right medial foot, left lateral foot and a small area on the right medial great toe. Right medial foot wound has been there for 6 to 7 months, left foot wound for 2 to 3 months and the right great toe only over the last few weeks. She has been followed by [name] at Dr. Laverta [place] office it sounds as though she has been undergoing callus paring and silver nitrate. The patient has been washing these off with soap and water and plan applying dry dressing Past medical history, type 2 diabetes well controlled with a recent hemoglobin A1c of 7 on 11/16, type 2 diabetes with peripheral neuropathy, previous left Charcot joint surgery bilateral Charcot joints currently, stage III chronic renal failure, hypertension, obstructive sleep apnea, history of MRSA and gastroesophageal reflux. 1/18; this is a patient with bilateral Charcot feet. Plain x-rays that I did of both of these wounds that are either at bone or precariously close to bone  were done. On the right foot this showed possible lytic destruction involving the anterior aspect of the talus which may represent a wound osteomyelitis. An MRI was recommended. On the left there was no definite lytic destruction although the wound is deep undermining's and I think probably requires an MRI on this basis in any case 1/25; patient was supposed to have her MRI last Friday of her bilateral feet however they would  not do it because there was silver alginate in her dressings, will replace with calcium alginate today and will see if we can get her done today 2/1; patient did not get her MRIs done last week because of issues with the MRI department receiving orders to do bilateral feet. She has 2 deep wounds in the setting of Charcot feet deformities bilaterally. Both of them at one point had exposed bone although I had trouble demonstrating that today. I am not sure that we can offload these very aggressively as I watched her leave the clinic last week her gait is very narrow and unsteady. 2/15; the MRI of her right foot did not show osteomyelitis potentially osteonecrosis of the talus. 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 The wounds are a large wound on the right medial foot and on the left a substantial wound on the left lateral plantar calcaneus. We have been using silver alginate. Ultimately I think we need to consider a total contact cast on the right while we work through the possibility of osteomyelitis in the left. I watched her walk out with her crutches I have some trepidation about the ability to walk with the cast on her right foot. Nevertheless with her Charcot deformities I do not think there is a way to heal these short of offloading them aggressively. 2/22; the culture of the bone in the left foot that I did last week showed a few Citrobacter Koseri as well as some streptococcal species. In my attempted bone for pathology did not actually yield a lot of bone the pathologist did not identify any osteomyelitis. 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 Is promised today and aggressive debridement of the right foot and a total contact cast which surprisingly she seemed to tolerate quite well 03/13/2019  upon evaluation today patient appears to be doing well with her total contact cast she is here for the first cast change. She had no areas of rubbing or any other complications at this point. Overall things are doing quite well. 2/26; the patient was kindly seen by Dr. Megan Salon of infectious disease on 2/25. She is now on IV vancomycin PICC line placement in 2 days and oral Flagyl. This was in response to her MRI showing osteomyelitis of the left calcaneus concerning for osteomyelitis. We have been putting her right leg in a total contact cast. Our nurses report drainage. She is tolerating the total contact cast well. 3/4; the patient is started her IV antibiotics and is taking IV vancomycin and oral Flagyl. She appears to be tolerating these both well. This is for osteomyelitis involving the left calcaneus. The area on the right in the setting of bilateral Charcot deformities did not have any bone infection on MRI. We put a total contact cast on her with silver alginate as the primary dressing and she returns today in follow-up. Per  our intake nurse there was too much drainage to leave this on all week therefore we will bring her back for an early change 3/8; follow-up total contact cast she is still having a lot of drainage probably too much drainage from the right foot wound will week with the same cast. She will be back on Thursday. The area on the left looks somewhat better 3/11; patient here for a total contact cast change. 3/15; patient came in for wound care evaluation. She is still on IV vancomycin and oral Flagyl for osteomyelitis in the left foot. The area on the right foot we are putting in a total contact cast. 3/18 for total contact cast change on the right foot 3/22; the patient was here for wound review today. The area on the right foot is slightly smaller in terms of surface area. However the surface of the wound is very gritty and hyper granulated. More problematically the area on  the plantar left foot has increased depth indeed in the center of this it probes to bone. We have been using Hydrofera Blue in both areas. She is on antibiotics as prescribed by infectious disease IV vancomycin and oral Flagyl 3/25; the patient is in for total contact cast change. Our intake nurse was concerned about the amount of drainage which soaked right to the multiple layers we are putting on this. Wondered about options. The wound bed actually looks as healthy as this is looked although there may not be a lot of change in dimensions things are looking a lot better. If we are going to heal this woman's wound which is in the middle of her right Charcot foot this is going to need to be offloaded. There are not many alternatives 3/29; still not a lot of improvement although the surface of the right wound looks better. We have been using Hydrofera Blue 04/17/2019 upon evaluation today patient appears to be doing well with a total contact cast. With actually having to change this twice a week. She saw Dr. Dellia Nims for wound care earlier in the week this is for the cast change today. That is due to the fact that she has significant drainage. Overall the wound is been looking excellent however. 4/5; we continue to put a total contact cast on this patient with Hydrofera Blue. Still a lot of drainage which precludes a simple weekly change or changing this twice a week. 4/8; total contact cast changed today. Apparently the wound looks better per our intake nurse 4/12; patient here for wound care evaluation. Wounds on the right foot have not changed in dimension none about a month left foot looks similar. Apparently her antibiotics that we are giving her for osteomyelitis in the left foot complete on Wednesday. We have been using Hydrofera Blue on the right foot under a total contact cast. Nurses report greenish drainage although I think this may be discoloration from the [facility] 4/15; back for cast  change wounds look quite good although still moderate amount of drainage. T contact cast reapplied otal 4/19;. Wound evaluation. Wound on the right foot is smaller surface looks healthy. There is still the divot in the middle part of this wound but I could not feel any bone. Area on the left foot also looks better She has completed her antibiotics but still has the PICC line in. 4/23; patient comes back for a total contact cast change this was done in the standard fashion no issues 4/26; wound measures slightly larger on the right  foot which is disappointing. She sees Dr. Orvan Falconer with regards to her antibiotics on Wednesday. I believe she is on vancomycin IV and oral Flagyl. 4/29; in for her routine total contact cast change. She saw Dr. Orvan Falconer this morning. He is asking about antibiotic continuation I will be in touch with him. I did not look at her wound today Electronic Signature(s) Signed: 05/16/2019 5:41:26 PM By: Baltazar Najjar MD Entered By: Baltazar Najjar on 05/16/2019 14:00:41 -------------------------------------------------------------------------------- Physical Exam Details Patient Name: Date of Service: [name], Angalena J. 05/16/2019 12:30 PM Medical Record Number: [id number] Patient Account Number: [id number] Date of Birth/Sex: Treating RN: 1955-01-22 (64 y.o. [name] Primary Care Provider: MA SO UDI, [place] MA LSA DA T Other Clinician: Referring Provider: Treating Provider/Extender: Baltazar Najjar MA SO UDI, ELHA MA LSA DA T Weeks in Treatment: 15 Constitutional Sitting or standing Blood Pressure is within target range for patient.. Pulse regular and within target range for patient.Marland Kitchen Respirations regular, non-labored and within target range.. Temperature is normal and within the target range for the patient.Marland Kitchen Appears in no distress. Notes Wound exam; routine total contact cast change. Apparently her drainage is actually better Electronic Signature(s) Signed:  05/16/2019 5:41:26 PM By: Baltazar Najjar MD Entered By: Baltazar Najjar on 05/16/2019 14:01:11 -------------------------------------------------------------------------------- Physician Orders Details Patient Name: Date of Service: [facility], Liddie J. 05/16/2019 12:30 PM Medical Record Number: [id number] Patient Account Number: [id number] Date of Birth/Sex: Treating RN: 1955/01/27 (64 y.o. [name] Primary Care Provider: MA SO UDI, [place] MA LSA DA T Other Clinician: Referring Provider: Treating Provider/Extender: Baltazar Najjar MA SO UDI, ELHA MA LSA DA T Weeks in Treatment: 15 Verbal / Phone Orders: No Diagnosis Coding Follow-up Appointments ppointment in 1 week. - MD visit Return A ppointment in: - Thursday or Friday for cast change Return A Dressing Change Frequency Wound #10 Left,Lateral,Plantar Foot Change Dressing every other day. Wound #9 Right,Medial Foot Do not change entire dressing for one week. Wound Cleansing May shower with protection. Primary Wound Dressing Wound #10 Left,Lateral,Plantar Foot Calcium Alginate with Silver Wound #9 Right,Medial Foot Hydrofera Blue Secondary Dressing Wound #10 Left,Lateral,Plantar Foot Kerlix/Rolled Gauze Dry Gauze Other: - felt callous pad Wound #9 Right,Medial Foot Kerlix/Rolled Gauze Zetuvit or Kerramax Drawtex Carboflex Off-Loading Total Contact Cast to Right Lower Extremity Open toe surgical shoe to: - add felt to sandal also Electronic Signature(s) Signed: 05/16/2019 5:33:37 PM By: Shawn Stall Signed: 05/16/2019 5:41:26 PM By: Baltazar Najjar MD Entered By: Shawn Stall on 05/16/2019 13:44:47 -------------------------------------------------------------------------------- Problem List Details Patient Name: Date of Service: [facility], Tanyia J. 05/16/2019 12:30 PM Medical Record Number: [id number] Patient Account Number: [id number] Date of Birth/Sex: Treating RN: 1955/07/06 (64 y.o. [name] Primary Care Provider: MA SO UDI, Judene Companion MA LSA DA T Other Clinician: Referring Provider: Treating Provider/Extender: Abe People, ELHA MA LSA DA T Weeks in Treatment: 15 Active Problems ICD-10 Encounter Code Description Active Date MDM Diagnosis E11.621 Type 2 diabetes mellitus with foot ulcer 01/28/2019 No Yes L97.524 Non-pressure chronic ulcer of other part of left foot with necrosis of bone 01/28/2019 No Yes L97.511 Non-pressure chronic ulcer of other part of right foot limited to breakdown of 01/28/2019 No Yes skin L97.514 Non-pressure chronic ulcer of other part of right foot with necrosis of bone 01/28/2019 No Yes E11.42 Type 2 diabetes mellitus with diabetic polyneuropathy 01/28/2019 No Yes M14.671 Charcot's joint, right ankle and foot 01/28/2019 No Yes M14.672 Charcot's joint, left ankle and foot 01/28/2019 No Yes  M84.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 05/16/2019 12:30 PM Medical Record Number: [id number] Patient Account Number: [id number] Date of Birth/Sex: Treating RN: Nov 03, 1955 (64 y.o. [name] Primary Care Provider: MA SO UDI, [place] MA LSA DA T Other Clinician: Referring Provider: Treating Provider/Extender: Baltazar Najjar MA SO UDI, ELHA MA LSA DA T Weeks in Treatment: 15 Subjective History of Present Illness (HPI) 64 yrs old bf here for follow up recurrent left charcot foot ulcer. She has DM. She is not a smoker. She states a blister developed 3 months ago at site of previous breakdown from 1 year ago. It was debrided at the podiatrist's office. She went to the ER and then sent here. She denies  pain. Xray was negative for osteo. Culture from this clinic negative. 09/08/14 Referred by [name] to [name] for Charcot foot. Seen by him and MRI scheduled 09/19/14. Prior silver collagen but this was not ordered last visit, patient has been rinsing with saline alone. Re institute silver collagen every other day. F/u 2 weeks with [name] as Labor Day holiday. Supplies ordered. 09/25/14; this is a patient with a Charcot foot on the left. she has a wound over the plantar aspect of her left calcaneus. She has been seen by [name] of orthopedic surgery and has had an MRI. She is apparently going within the next week or 2 for corrective surgery which will involve an external fixator. I don't have any of the details of this. 10/06/14; The patient is a 64 yrs old bf with a left Charcot foot and ulcer on the plantar. 12/01/14 Returns post surgery with [name]. Has follow up visit later this week with him, currently in facility and NWB over this extremity. States no drainage from foot and no current wound care. On exam callus and scab in place, suspect healed. Has external fixator in place. Will defer to [name] for management, ok to place moisturizing cream over scabs and callus and she will call if she requires follow up here. READMISSION 01/28/2019 Patient was in the clinic in 2016 for a wound on the plantar aspect of her left calcaneus. Predominantly cared for by [name] she was referred to [name] and had corrective surgery for the position of her left ankle I believe. She had previously been here in 2015 and then prior to that in 2011 2012 that I do not have these records. More recently she has developed fairly extensive wounds on the right medial foot, left lateral foot and a small area on the right medial great toe. Right medial foot wound has been there for 6 to 7 months, left foot wound for 2 to 3 months and the right great toe only over the last few weeks. She has been  followed by [name] at Dr. Laverta [place] office it sounds as though she has been undergoing callus paring and silver nitrate. The patient has been washing these off with soap and water and plan applying dry dressing Past medical history, type 2 diabetes well controlled with a recent hemoglobin A1c of 7 on 11/16, type 2 diabetes with peripheral neuropathy, previous left Charcot joint surgery bilateral Charcot joints currently, stage III chronic renal failure, hypertension, obstructive sleep apnea, history of MRSA and gastroesophageal reflux. 1/18; this  is a patient with bilateral Charcot feet. Plain x-rays that I did of both of these wounds that are either at bone or precariously close to bone were done. On the right foot this showed possible lytic destruction involving the anterior aspect of the talus which may represent a wound osteomyelitis. An MRI was recommended. On the left there was no definite lytic destruction although the wound is deep undermining's and I think probably requires an MRI on this basis in any case 1/25; patient was supposed to have her MRI last Friday of her bilateral feet however they would not do it because there was silver alginate in her dressings, will replace with calcium alginate today and will see if we can get her done today 2/1; patient did not get her MRIs done last week because of issues with the MRI department receiving orders to do bilateral feet. She has 2 deep wounds in the setting of Charcot feet deformities bilaterally. Both of them at one point had exposed bone although I had trouble demonstrating that today. I am not sure that we can offload these very aggressively as I watched her leave the clinic last week her gait is very narrow and unsteady. 2/15; the MRI of her right foot did not show osteomyelitis potentially osteonecrosis of the talus. 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 The wounds are a large  wound on the right medial foot and on the left a substantial wound on the left lateral plantar calcaneus. We have been using silver alginate. Ultimately I think we need to consider a total contact cast on the right while we work through the possibility of osteomyelitis in the left. I watched her walk out with her crutches I have some trepidation about the ability to walk with the cast on her right foot. Nevertheless with her Charcot deformities I do not think there is a way to heal these short of offloading them aggressively. 2/22; the culture of the bone in the left foot that I did last week showed a few Citrobacter Koseri as well as some streptococcal species. In my attempted bone for pathology did not actually yield a lot of bone the pathologist did not identify any osteomyelitis. 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 Is promised today and aggressive debridement of the right foot and a total contact cast which surprisingly she seemed to tolerate quite well 03/13/2019 upon evaluation today patient appears to be doing well with her total contact cast she is here for the first cast change. She had no areas of rubbing or any other complications at this point. Overall things are doing quite well. 2/26; the patient was kindly seen by Dr. Orvan Falconer of infectious disease on 2/25. She is now on IV vancomycin PICC line placement in 2 days and oral Flagyl. This was in response to her MRI showing osteomyelitis of the left calcaneus concerning for osteomyelitis. We have been putting her right leg in a total contact cast. Our nurses report drainage. She is tolerating the total contact cast well. 3/4; the patient is started her IV antibiotics and is taking IV vancomycin and oral Flagyl. She appears to be tolerating these both well. This is for  osteomyelitis involving the left calcaneus. The area on the right in the setting of bilateral Charcot deformities did not have  any bone infection on MRI. We put a total contact cast on her with silver alginate as the primary dressing and she returns today in follow-up. Per our intake nurse there was too much drainage to leave this on all week therefore we will bring her back for an early change 3/8; follow-up total contact cast she is still having a lot of drainage probably too much drainage from the right foot wound will week with the same cast. She will be back on Thursday. The area on the left looks somewhat better 3/11; patient here for a total contact cast change. 3/15; patient came in for wound care evaluation. She is still on IV vancomycin and oral Flagyl for osteomyelitis in the left foot. The area on the right foot we are putting in a total contact cast. 3/18 for total contact cast change on the right foot 3/22; the patient was here for wound review today. The area on the right foot is slightly smaller in terms of surface area. However the surface of the wound is very gritty and hyper granulated. More problematically the area on the plantar left foot has increased depth indeed in the center of this it probes to bone. We have been using Hydrofera Blue in both areas. She is on antibiotics as prescribed by infectious disease IV vancomycin and oral Flagyl 3/25; the patient is in for total contact cast change. Our intake nurse was concerned about the amount of drainage which soaked right to the multiple layers we are putting on this. Wondered about options. The wound bed actually looks as healthy as this is looked although there may not be a lot of change in dimensions things are looking a lot better. If we are going to heal this woman's wound which is in the middle of her right Charcot foot this is going to need to be offloaded. There are not many alternatives 3/29; still not a lot of  improvement although the surface of the right wound looks better. We have been using Hydrofera Blue 04/17/2019 upon evaluation today patient appears to be doing well with a total contact cast. With actually having to change this twice a week. She saw [name] for wound care earlier in the week this is for the cast change today. That is due to the fact that she has significant drainage. Overall the wound is been looking excellent however. 4/5; we continue to put a total contact cast on this patient with Hydrofera Blue. Still a lot of drainage which precludes a simple weekly change or changing this twice a week. 4/8; total contact cast changed today. Apparently the wound looks better per our intake nurse 4/12; patient here for wound care evaluation. Wounds on the right foot have not changed in dimension none about a month left foot looks similar. Apparently her antibiotics that we are giving her for osteomyelitis in the left foot complete on Wednesday. We have been using Hydrofera Blue on the right foot under a total contact cast. Nurses report greenish drainage although I think this may be discoloration from the [facility] 4/15; back for cast change wounds look quite good although still moderate amount of drainage. T contact cast reapplied otal 4/19;. Wound evaluation. Wound on the right foot is smaller surface looks healthy. There is still the divot in the middle part of this wound but I could not feel any bone. Area on the left foot also looks better She has completed her antibiotics but still has the PICC line in. 4/23;  patient comes back for a total contact cast change this was done in the standard fashion no issues 4/26; wound measures slightly larger on the right foot which is disappointing. She sees Dr. Megan Salon with regards to her antibiotics on Wednesday. I believe she is on vancomycin IV and oral Flagyl. 4/29; in for her routine total contact cast change. She saw Dr. Megan Salon this  morning. He is asking about antibiotic continuation I will be in touch with him. I did not look at her wound today Objective Constitutional Sitting or standing Blood Pressure is within target range for patient.. Pulse regular and within target range for patient.Marland Kitchen Respirations regular, non-labored and within target range.. Temperature is normal and within the target range for the patient.Marland Kitchen Appears in no distress. Vitals Time Taken: 12:58 PM, Height: 68 in, Weight: 265 lbs, BMI: 40.3, Temperature: 98.0 F, Pulse: 70 bpm, Respiratory Rate: 18 breaths/min, Blood Pressure: 138/66 mmHg, Capillary Blood Glucose: 134 mg/dl. General Notes: glucose per pt report General Notes: Wound exam; routine total contact cast change. Apparently her drainage is actually better Integumentary (Hair, Skin) Wound #9 status is Open. Original cause of wound was Blister. The wound is located on the Right,Medial Foot. The wound measures 3.5cm length x 2.9cm width x 0.1cm depth; 7.972cm^2 area and 0.797cm^3 volume. There is Fat Layer (Subcutaneous Tissue) Exposed exposed. There is no tunneling or undermining noted. There is a large amount of serosanguineous drainage noted. The wound margin is thickened. There is large (67-100%) red, pink, hyper - granulation within the wound bed. There is no necrotic tissue within the wound bed. 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 There was a T Contact Cast otal Procedure by Ricard Dillon., MD. Post procedure Diagnosis Wound #9: Same as Pre-Procedure Plan Follow-up Appointments: Return Appointment in 1 week. - MD visit Return Appointment in: - Thursday or Friday for cast change Dressing Change Frequency: Wound #10 Left,Lateral,Plantar Foot: Change Dressing every other day. Wound #9 Right,Medial Foot: Do not change entire dressing for one week. Wound Cleansing: May shower with protection. Primary Wound Dressing: Wound #10 Left,Lateral,Plantar Foot: Calcium Alginate with Silver Wound #9 Right,Medial Foot: Hydrofera Blue Secondary Dressing: Wound #10 Left,Lateral,Plantar Foot: Kerlix/Rolled Gauze Dry Gauze Other: - felt callous pad Wound #9 Right,Medial Foot: Kerlix/Rolled Gauze Zetuvit or Kerramax Drawtex Carboflex Off-Loading: T Contact Cast to Right Lower Extremity otal Open toe surgical shoe to: - add felt to sandal also 1. Continue with Hydrofera Blue on the right and silver alginate on the left foot 2. T contact cast change in the standard fashion otal Electronic Signature(s) Signed: 05/16/2019 5:41:26 PM By: Linton Ham MD Entered By: Linton Ham on 05/16/2019 14:01:52 -------------------------------------------------------------------------------- Total Contact Cast Details Patient Name: Date of Service: Yolanda Manges, Ronae J. 05/16/2019 12:30 PM Medical Record Number: [id number] Patient Account Number: [id number] Date of Birth/Sex: Treating RN: 01-10-1956 (64 y.o. [name] Primary Care Provider: MA SO UDI, ELHA MA LSA DA T Other Clinician: Referring  Provider: Treating Provider/Extender: Baltazar Najjar MA SO UDI, ELHA MA LSA DA T Weeks in Treatment: 15 T Contact Cast Applied for Wound Assessment: otal Wound #9 Right,Medial Foot Performed By: Physician [name], MD Post Procedure Diagnosis Same as Pre-procedure Electronic  Signature(s) Signed: 05/16/2019 5:41:26 PM By: Baltazar Najjar MD Entered By: Baltazar Najjar on 05/16/2019 14:00:05 -------------------------------------------------------------------------------- SuperBill Details Patient Name: Date of Service: [facility], Marcus J. 05/16/2019 Medical Record Number: [id number] Patient Account Number: [id number] Date of Birth/Sex: Treating RN: [birth year] (63 y.o. [name] Primary Care Provider: MA SO UDI, [place] MA LSA DA T Other Clinician: Referring Provider: Treating Provider/Extender: Abe People, ELHA MA LSA DA T Weeks in Treatment: 15 Diagnosis Coding ICD-10 Codes Code Description E11.621 Type 2 diabetes mellitus with foot ulcer L97.524 Non-pressure chronic ulcer of other part of left foot with necrosis of bone L97.511 Non-pressure chronic ulcer of other part of right foot limited to breakdown of skin L97.514 Non-pressure chronic ulcer of other part of right foot with necrosis of bone E11.42 Type 2 diabetes mellitus with diabetic polyneuropathy M14.671 Charcot's joint, right ankle and foot M14.672 Charcot's joint, left ankle and foot 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, left ankle and foot Facility Procedures CPT4 Code: 60600459 L Description: 97741 - APPLY TOTAL CONTACT LEG CAST ICD-10 Diagnosis Description 97.511 Non-pressure chronic ulcer of other part of right foot limited to breakdown o Modifier: f skin Quantity: 1 Physician Procedures : CPT4 Code Description Modifier [id number] - WC PHYS APPLY TOTAL CONTACT CAST ICD-10 Diagnosis Description L97.511 Non-pressure chronic ulcer of other part of right foot limited to breakdown of skin Quantity: 1 Electronic Signature(s) Signed: 05/16/2019 5:41:26 PM By: Baltazar Najjar MD Entered By: Baltazar Najjar on 05/16/2019 14:02:10

## 2019-05-20 ENCOUNTER — Other Ambulatory Visit: Payer: Self-pay

## 2019-05-20 ENCOUNTER — Encounter (HOSPITAL_BASED_OUTPATIENT_CLINIC_OR_DEPARTMENT_OTHER): Payer: Medicare Other | Attending: Internal Medicine | Admitting: Internal Medicine

## 2019-05-20 ENCOUNTER — Other Ambulatory Visit: Payer: Self-pay | Admitting: Internal Medicine

## 2019-05-20 ENCOUNTER — Telehealth: Payer: Self-pay | Admitting: Internal Medicine

## 2019-05-20 DIAGNOSIS — M86072 Acute hematogenous osteomyelitis, left ankle and foot: Secondary | ICD-10-CM

## 2019-05-20 DIAGNOSIS — L97514 Non-pressure chronic ulcer of other part of right foot with necrosis of bone: Secondary | ICD-10-CM | POA: Insufficient documentation

## 2019-05-20 DIAGNOSIS — M86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, left ankle and foot: Secondary | ICD-10-CM | POA: Diagnosis not present

## 2019-05-20 DIAGNOSIS — E1142 Type 2 diabetes mellitus with diabetic polyneuropathy: Secondary | ICD-10-CM | POA: Diagnosis not present

## 2019-05-20 DIAGNOSIS — E1122 Type 2 diabetes mellitus with diabetic chronic kidney disease: Secondary | ICD-10-CM | POA: Diagnosis not present

## 2019-05-20 DIAGNOSIS — N183 Chronic kidney disease, stage 3 unspecified: Secondary | ICD-10-CM | POA: Insufficient documentation

## 2019-05-20 DIAGNOSIS — E11621 Type 2 diabetes mellitus with foot ulcer: Secondary | ICD-10-CM | POA: Diagnosis not present

## 2019-05-20 DIAGNOSIS — L97524 Non-pressure chronic ulcer of other part of left foot with necrosis of bone: Secondary | ICD-10-CM | POA: Diagnosis not present

## 2019-05-20 DIAGNOSIS — M14672 Charcot's joint, left ankle and foot: Secondary | ICD-10-CM | POA: Insufficient documentation

## 2019-05-20 DIAGNOSIS — L97511 Non-pressure chronic ulcer of other part of right foot limited to breakdown of skin: Secondary | ICD-10-CM | POA: Insufficient documentation

## 2019-05-20 DIAGNOSIS — G4733 Obstructive sleep apnea (adult) (pediatric): Secondary | ICD-10-CM | POA: Diagnosis not present

## 2019-05-20 DIAGNOSIS — M14671 Charcot's joint, right ankle and foot: Secondary | ICD-10-CM | POA: Insufficient documentation

## 2019-05-20 DIAGNOSIS — Z8614 Personal history of Methicillin resistant Staphylococcus aureus infection: Secondary | ICD-10-CM | POA: Insufficient documentation

## 2019-05-20 DIAGNOSIS — K219 Gastro-esophageal reflux disease without esophagitis: Secondary | ICD-10-CM | POA: Insufficient documentation

## 2019-05-20 DIAGNOSIS — M86372 Chronic multifocal osteomyelitis, left ankle and foot: Secondary | ICD-10-CM

## 2019-05-20 DIAGNOSIS — I129 Hypertensive chronic kidney disease with stage 1 through stage 4 chronic kidney disease, or unspecified chronic kidney disease: Secondary | ICD-10-CM | POA: Diagnosis not present

## 2019-05-20 MED ORDER — METRONIDAZOLE 500 MG PO TABS: 500.0000 mg | ORAL_TABLET | Freq: Three times a day (TID) | ORAL | 1 refills | Status: DC

## 2019-05-20 MED ORDER — CEPHALEXIN 500 MG PO CAPS: 500.0000 mg | ORAL_CAPSULE | Freq: Two times a day (BID) | ORAL | 1 refills | Status: DC

## 2019-05-20 NOTE — Progress Notes (Signed)
KANIJAH, GROSECLOSE ([id number]) Visit Report for 05/20/2019 Arrival Information Details Patient Name: Date of Service: [name], [name] 05/20/2019 12:45 PM Medical Record Number: [id number] Patient Account Number: [id number] Date of Birth/Sex: Treating RN: 13-Jan-1956 (64 y.o. F) Kela Millin Primary Care Shayra Anton: MA SO UDI, [place] MA LSA DA T Other Clinician: Referring Kiamesha Samet: Treating Tisha Cline/Extender: Linton Ham MA SO UDI, ELHA MA LSA DA T Weeks in Treatment: 16 Visit Information History Since Last Visit Added or deleted any medications: No Patient Arrived: Crutches Any new allergies or adverse reactions: No Arrival Time: 12:51 Had a fall or experienced change in No Accompanied By: self activities of daily living that may affect Transfer Assistance: None risk of falls: Patient Identification Verified: Yes Signs or symptoms of abuse/neglect since No Secondary Verification Process Completed: Yes last visito Patient Requires Transmission-Based Precautions: No Hospitalized since last visit: No Patient Has Alerts: No Implantable device outside of the clinic No excluding cellular tissue based products placed in the center since last visit: Has Dressing in Place as Prescribed: Yes Has Footwear/Offloading in Place as Yes Prescribed: Left: Surgical Shoe with Pressure Relief Insole Right: T Contact Cast otal Pain Present Now: No Electronic Signature(s) Signed: 05/20/2019 4:55:42 PM By: Kela Millin Entered By: Kela Millin on 05/20/2019 12:52:00 -------------------------------------------------------------------------------- Encounter Discharge Information Details Patient Name: Date of Service: [facility], [place]. 05/20/2019 12:45 PM Medical Record Number: [id number] Patient Account Number: [id number] Date of Birth/Sex: Treating RN: [birth year] (64 y.o. [name] Primary Care Mico Spark: MA SO UDI, [place] MA LSA DA T Other Clinician: Referring  Markiya Keefe: Treating Reilly Blades/Extender: Linton Ham MA SO UDI, ELHA MA LSA DA T Weeks in Treatment: 26 Encounter Discharge Information Items Post Procedure Vitals Discharge Condition: Stable Temperature (F): 97.6 Ambulatory Status: Crutches Pulse (bpm): 77 Discharge Destination: Home Respiratory Rate (breaths/min): 18 Transportation: Private Auto Blood Pressure (mmHg): 124/83 Accompanied By: self Schedule Follow-up Appointment: Yes Clinical Summary of Care: Electronic Signature(s) Signed: 05/20/2019 4:26:09 PM By: Deon Pilling Entered By: Deon Pilling on 05/20/2019 14:07:02 -------------------------------------------------------------------------------- Lower Extremity Assessment Details Patient Name: Date of Service: [name], [name] 05/20/2019 12:45 PM Medical Record Number: [id number] Patient Account Number: [id number] Date of Birth/Sex: Treating RN: September 03, 1955 (64 y.o. F) Kela Millin Primary Care Josefita Weissmann: MA SO UDI, [place] MA LSA DA T Other Clinician: Referring Sandralee Tarkington: Treating Bedelia Pong/Extender: Linton Ham MA SO UDI, ELHA MA LSA DA T Weeks in Treatment: 16 Edema Assessment Assessed: [Left: No] [Right: No] Edema: [Left: Yes] [Right: Yes] Calf Left: Right: Point of Measurement: 43 cm From Medial Instep 38 cm 37 cm Ankle Left: Right: Point of Measurement: 12 cm From Medial Instep 23 cm 27 cm Vascular Assessment Pulses: Dorsalis Pedis Palpable: [Left:Yes] [Right:Yes] Electronic Signature(s) Signed: 05/20/2019 4:55:42 PM By: Kela Millin Entered By: Kela Millin on 05/20/2019 13:04:55 -------------------------------------------------------------------------------- Multi Wound Chart Details Patient Name: Date of Service: [name], [name]. 05/20/2019 12:45 PM Medical Record Number: [id number] Patient Account Number: [id number] Date of Birth/Sex: Treating RN: Feb 22, 1955 (64 y.o. [name] Primary Care Alezander Dimaano: MA SO UDI, [place] MA  LSA DA T Other Clinician: Referring Bich Mchaney: Treating Delman Goshorn/Extender: Linton Ham MA SO UDI, ELHA MA LSA DA T Weeks in Treatment: 16 Vital Signs Height(in): 68 Capillary Blood Glucose(mg/dl): 127 Weight(lbs): 265 Pulse(bpm): 47 Body Mass Index(BMI): 40 Blood Pressure(mmHg): 124/83 Temperature(F): 97.6 Respiratory Rate(breaths/min): 18 Photos: [10:No Photos Left, Lateral, Plantar Foot] [9:No Photos Right, Medial Foot] [N/A:N/A N/A] Wound Location: [10:Blister] [9:Blister] [N/A:N/A] Wounding Event: [10:Diabetic Wound/Ulcer of the Lower] [9:Diabetic Wound/Ulcer  of the Lower] [N/A:N/A] Primary Etiology: [10:Extremity Glaucoma, Chronic sinus] [9:Extremity Glaucoma, Chronic sinus] [N/A:N/A] Comorbid History: [10:problems/congestion, Anemia, Sleep Apnea, Hypertension, Type II Diabetes, Rheumatoid Arthritis, Neuropathy 11/18/2018] [9:problems/congestion, Anemia, Sleep Apnea, Hypertension, Type II Diabetes, Rheumatoid Arthritis, Neuropathy  06/18/2018] [N/A:N/A] Date Acquired: [10:16] [9:16] [N/A:N/A] Weeks of Treatment: [10:Open] [9:Open] [N/A:N/A] Wound Status: [10:1.2x1.4x1.4] [9:3.6x2.6x0.3] [N/A:N/A] Measurements L x W x D (cm) [10:1.319] [9:7.351] [N/A:N/A] A (cm) : rea [10:1.847] [9:2.205] [N/A:N/A] Volume (cm) : [10:25.00%] [9:33.10%] [N/A:N/A] % Reduction in A [10:rea: 30.00%] [9:77.70%] [N/A:N/A] % Reduction in Volume: [10:9] Position 1 (o'clock): [10:1.4] Maximum Distance 1 (cm): [10:Yes] [9:No] [N/A:N/A] Tunneling: [10:Grade 2] [9:Grade 2] [N/A:N/A] Classification: [10:Medium] [9:Large] [N/A:N/A] Exudate A mount: [10:Serosanguineous] [9:Serosanguineous] [N/A:N/A] Exudate Type: [10:red, brown] [9:red, brown] [N/A:N/A] Exudate Color: [10:Thickened] [9:Thickened] [N/A:N/A] Wound Margin: [10:Large (67-100%)] [9:Large (67-100%)] [N/A:N/A] Granulation A mount: [10:Red, Pink] [9:Red, Pink, Hyper-granulation] [N/A:N/A] Granulation Quality: [10:None Present (0%)] [9:None  Present (0%)] [N/A:N/A] Necrotic A mount: [10:Fat Layer (Subcutaneous Tissue)] [9:Fat Layer (Subcutaneous Tissue)] [N/A:N/A] Exposed Structures: [10:Exposed: Yes Muscle: Yes Fascia: No Tendon: No Joint: No Bone: No None] [9:Exposed: Yes Fascia: No Tendon: No Muscle: No Joint: No Bone: No Small (1-33%)] [N/A:N/A] Epithelialization: [10:Debridement - Excisional] [9:Debridement - Excisional] [N/A:N/A] Debridement: Pre-procedure Verification/Time Out 13:30 [9:13:30] [N/A:N/A] Taken: [10:Other] [9:Other] [N/A:N/A] Pain Control: [10:Callus, Subcutaneous] [9:Callus, Subcutaneous] [N/A:N/A] Tissue Debrided: [10:Skin/Subcutaneous Tissue] [9:Skin/Subcutaneous Tissue] [N/A:N/A] Level: [10:1.68] [9:9.36] [N/A:N/A] Debridement A (sq cm): [10:rea Curette] [9:Blade, Curette] [N/A:N/A] Instrument: [10:Moderate] [9:Moderate] [N/A:N/A] Bleeding: [10:Pressure] [9:Silver Nitrate] [N/A:N/A] Hemostasis A chieved: [10:0] [9:0] [N/A:N/A] Procedural Pain: [10:0] [9:0] [N/A:N/A] Post Procedural Pain: [10:Procedure was tolerated well] [9:Procedure was tolerated well] [N/A:N/A] Debridement Treatment Response: [10:1.2x1.4x1.4] [9:3.6x2.6x0.3] [N/A:N/A] Post Debridement Measurements L x W x D (cm) [10:1.847] [9:2.205] [N/A:N/A] Post Debridement Volume: (cm) [10:Debridement] [9:Debridement] [N/A:N/A] Procedures Performed: [9:T Contact Cast otal] Treatment Notes Electronic Signature(s) Signed: 05/20/2019 5:27:06 PM By: Zandra Abts RN, BSN Signed: 05/20/2019 5:33:20 PM By: Baltazar Najjar MD Entered By: Baltazar Najjar on 05/20/2019 14:00:16 -------------------------------------------------------------------------------- Multi-Disciplinary Care Plan Details Patient Name: Date of Service: [facility], Zori J. 05/20/2019 12:45 PM Medical Record Number: [id number] Patient Account Number: [id number] Date of Birth/Sex: Treating RN: 1955-09-22 (64 y.o. [name] Primary Care Alonah Lineback: MA SO UDI, Judene Companion MA LSA DA  T Other Clinician: Referring Algie Cales: Treating Staley Lunz/Extender: Baltazar Najjar MA SO UDI, ELHA MA LSA DA T Weeks in Treatment: 16 Active Inactive Wound/Skin Impairment Nursing Diagnoses: Impaired tissue integrity Knowledge deficit related to ulceration/compromised skin integrity Goals: Patient/caregiver will verbalize understanding of skin care regimen Date Initiated: 01/28/2019 Target Resolution Date: 06/14/2019 Goal Status: Active Interventions: Assess patient/caregiver ability to obtain necessary supplies Assess patient/caregiver ability to perform ulcer/skin care regimen upon admission and as needed Assess ulceration(s) every visit Provide education on ulcer and skin care Notes: Electronic Signature(s) Signed: 05/20/2019 5:27:06 PM By: Zandra Abts RN, BSN Entered By: Zandra Abts on 05/20/2019 15:05:13 -------------------------------------------------------------------------------- Pain Assessment Details Patient Name: Date of Service: [name], Jeriyah J. 05/20/2019 12:45 PM Medical Record Number: [id number] Patient Account Number: [id number] Date of Birth/Sex: Treating RN: 1955/09/02 (64 y.o. [name] Primary Care Cyndia Degraff: MA SO UDI, [place] MA LSA DA T Other Clinician: Referring Kierre Hintz: Treating Asiana Benninger/Extender: Baltazar Najjar MA SO UDI, ELHA MA LSA DA T Weeks in Treatment: 16 Active Problems Location of Pain Severity and Description of Pain Patient Has Paino No Site Locations Pain Management and Medication Current Pain Management: Electronic Signature(s) Signed: 05/20/2019 4:55:42 PM By: [name] Entered By: [name] on 05/20/2019 12:56:15 -------------------------------------------------------------------------------- Patient/Caregiver  Education Details Patient Name: Date of Service: MANAL, KREUTZER 5/3/2021andnbsp12:45 PM Medical Record Number: [id number] Patient Account Number: [id number] Date of  Birth/Gender: Treating RN: 05-14-1955 (64 y.o. [name] Primary Care Physician: MA SO UDI, Judene Companion MA LSA DA T Other Clinician: Referring Physician: Treating Physician/Extender: Baltazar Najjar MA SO UDI, ELHA MA LSA DA T Weeks in Treatment: 16 Education Assessment Education Provided To: Patient Education Topics Provided Wound/Skin Impairment: Methods: Explain/Verbal Responses: State content correctly [employer]) Signed: 05/20/2019 5:27:06 PM By: Zandra Abts RN, BSN Entered By: Zandra Abts on 05/20/2019 15:06:44 -------------------------------------------------------------------------------- Wound Assessment Details Patient Name: Date of Service: [name], [name] 05/20/2019 12:45 PM Medical Record Number: [id number] Patient Account Number: [id number] Date of Birth/Sex: Treating RN: [birth year] (63 y.o. F) Dwiggins, Shannon Primary Care Chonda Baney: MA SO UDI, [place] MA LSA DA T Other Clinician: Referring Akeen Ledyard: Treating Graylee Arutyunyan/Extender: Baltazar Najjar MA SO UDI, ELHA MA LSA DA T Weeks in Treatment: 16 Wound Status Wound Number: 10 Primary Diabetic Wound/Ulcer of the Lower Extremity Etiology: Wound Location: Left, Lateral, Plantar Foot Wound Open Wounding Event: Blister Status: Date Acquired: 11/18/2018 Comorbid Glaucoma, Chronic sinus problems/congestion, Anemia, Sleep Weeks Of Treatment: 16 History: Apnea, Hypertension, Type II Diabetes, Rheumatoid Arthritis, Clustered Wound: No Neuropathy Wound Measurements Length: (cm) 1.2 Width: (cm) 1.4 Depth: (cm) 1.4 Area: (cm) 1.319 Volume: (cm) 1.847 % Reduction in Area: 25% % Reduction in Volume: 30% Epithelialization: None Tunneling: Yes Position (o'clock): 9 Maximum Distance: (cm) 1.4 Undermining: No Wound Description Classification: Grade 2 Wound Margin: Thickened Exudate Amount: Medium Exudate Type: Serosanguineous Exudate Color: red, brown Foul Odor After Cleansing:  No Slough/Fibrino No Wound Bed Granulation Amount: Large (67-100%) Exposed Structure Granulation Quality: Red, Pink Fascia Exposed: No Necrotic Amount: None Present (0%) Fat Layer (Subcutaneous Tissue) Exposed: Yes Tendon Exposed: No Muscle Exposed: Yes Necrosis of Muscle: No Joint Exposed: No Bone Exposed: No Treatment Notes Wound #10 (Left, Lateral, Plantar Foot) 1. Cleanse With Wound Cleanser 2. Periwound Care Moisturizing lotion 3. Primary Dressing Applied Calcium Alginate Ag 4. Secondary Dressing Dry Gauze Roll Gauze 5. Secured With Medipore tape 7. Footwear/Offloading device applied Felt/Foam T Contact Cast otal Notes stockinette Electronic Signature(s) Signed: 05/20/2019 4:55:42 PM By: [name] Signed: 05/20/2019 5:02:18 PM By: Yevonne Pax RN Entered By: Yevonne Pax on 05/20/2019 13:12:20 -------------------------------------------------------------------------------- Wound Assessment Details Patient Name: Date of Service: [name], [name] 05/20/2019 12:45 PM Medical Record Number: [id number] Patient Account Number: [id number] Date of Birth/Sex: Treating RN: January 31, 1955 (64 y.o. Sharyne Peach, Carrie Primary Care Richetta Cubillos: MA SO UDI, [place] MA LSA DA T Other Clinician: Referring Via Rosado: Treating Karon Heckendorn/Extender: Baltazar Najjar MA SO UDI, ELHA MA LSA DA T Weeks in Treatment: 16 Wound Status Wound Number: 9 Primary Diabetic Wound/Ulcer of the Lower Extremity Etiology: Wound Location: Right, Medial Foot Wound Open Wounding Event: Blister Status: Date Acquired: 06/18/2018 Comorbid Glaucoma, Chronic sinus problems/congestion, Anemia, Sleep Weeks Of Treatment: 16 History: Apnea, Hypertension, Type II Diabetes, Rheumatoid Arthritis, Clustered Wound: No Neuropathy Wound Measurements Length: (cm) 3.6 Width: (cm) 2.6 Depth: (cm) 0.3 Area: (cm) 7.351 Volume: (cm) 2.205 % Reduction in Area: 33.1% % Reduction in Volume: 77.7% Epithelialization:  Small (1-33%) Tunneling: No Undermining: No Wound Description Classification: Grade 2 Wound Margin: Thickened Exudate Amount: Large Exudate Type: Serosanguineous Exudate Color: red, brown Foul Odor After Cleansing: No Slough/Fibrino No Wound Bed Granulation Amount: Large (67-100%) Exposed Structure Granulation Quality: Red, Pink, Hyper-granulation Fascia Exposed: No Necrotic Amount: None Present (0%) Fat Layer (Subcutaneous Tissue) Exposed: Yes Tendon  Exposed: No Muscle Exposed: No Joint Exposed: No Bone Exposed: No Treatment Notes Wound #9 (Right, Medial Foot) 1. Cleanse With Wound Cleanser Soap and water 2. Periwound Care Moisturizing lotion 3. Primary Dressing Applied Hydrofera Blue 4. Secondary Dressing ABD Pad Dry Gauze Roll Gauze Kerramax/Xtrasorb Drawtex 5. Secured With Medipore tape 7. Footwear/Offloading device applied T Contact Cast otal Notes TCC applied by MD. Electronic Signature(s) Signed: 05/20/2019 5:02:18 PM By: Yevonne Pax RN Entered By: Yevonne Pax on 05/20/2019 13:13:39 -------------------------------------------------------------------------------- Vitals Details Patient Name: Date of Service: [facility], [name] 05/20/2019 12:45 PM Medical Record Number: [id number] Patient Account Number: [id number] Date of Birth/Sex: Treating RN: January 02, 1956 (64 y.o. F) Dwiggins, Shannon Primary Care Yanky Vanderburg: MA SO UDI, [place] MA LSA DA T Other Clinician: Referring Kasson Lamere: Treating Shanaiya Bene/Extender: Baltazar Najjar MA SO UDI, ELHA MA LSA DA T Weeks in Treatment: 16 Vital Signs Time Taken: 12:52 Temperature (F): 97.6 Height (in): 68 Pulse (bpm): 77 Weight (lbs): 265 Respiratory Rate (breaths/min): 18 Body Mass Index (BMI): 40.3 Blood Pressure (mmHg): 124/83 Capillary Blood Glucose (mg/dl): 354 Reference Range: 80 - 120 mg / dl Electronic Signature(s) Signed: 05/20/2019 4:55:42 PM By: [name] Entered By: [name] on  05/20/2019 12:56:08

## 2019-05-20 NOTE — Telephone Encounter (Signed)
Per Dr Orvan Falconer, relayed verbal order to [facility] at [facility] to pull PICC.  Andree Coss, RN

## 2019-05-20 NOTE — Telephone Encounter (Signed)
I spoke to [name] today after her visit with [name].  I will continue therapy for her foot infection with oral cephalexin and metronidazole and have her PICC removed.

## 2019-05-20 NOTE — Telephone Encounter (Signed)
-----   Message from Maxwell Caul, MD sent at 05/16/2019  2:38 PM EDT ----- Her wounds are better. Any options for perhaps a more prolonged ie another month of oral rx ?? ----- Message ----- From: Cliffton Asters, MD Sent: 05/15/2019   4:37 PM EDT To: Maxwell Caul, MD  Kathlene November,  I saw Ms. Rommel today.  She has now completed about 8 weeks of IV ceftriaxone and oral metronidazole for her polymicrobial left heel osteomyelitis.  Her sedimentation rate remains elevated at 81 but her foot is looking better to me.  Please let me know what you think about stopping antibiotics soon.  Jonny Ruiz

## 2019-05-20 NOTE — Progress Notes (Signed)
DINNA, SEVERS ([id number]) Visit Report for 05/20/2019 Debridement Details Patient Name: Date of Service: [name], [name] 05/20/2019 12:45 PM Medical Record Number: [id number] Patient Account Number: [id number] Date of Birth/Sex: Treating RN: 1955-03-11 (64 y.o. [name] Primary Care Provider: MA SO UDI, [place] MA LSA DA T Other Clinician: Referring Provider: Treating Provider/Extender: Baltazar Najjar MA SO UDI, ELHA MA LSA DA T Weeks in Treatment: 16 Debridement Performed for Assessment: Wound #10 Left,Lateral,Plantar Foot Performed By: Physician [name], MD Debridement Type: Debridement Severity of Tissue Pre Debridement: Fat layer exposed Level of Consciousness (Pre-procedure): Awake and Alert Pre-procedure Verification/Time Out Yes - 13:30 Taken: Start Time: 13:30 Pain Control: Other : Benzocaine 20% T Area Debrided (L x W): otal 1.2 (cm) x 1.4 (cm) = 1.68 (cm) Tissue and other material debrided: Viable, Non-Viable, Callus, Subcutaneous Level: Skin/Subcutaneous Tissue Debridement Description: Excisional Instrument: Curette Bleeding: Moderate Hemostasis Achieved: Pressure End Time: 13:33 Procedural Pain: 0 Post Procedural Pain: 0 Response to Treatment: Procedure was tolerated well Level of Consciousness (Post- Awake and Alert procedure): Post Debridement Measurements of Total Wound Length: (cm) 1.2 Width: (cm) 1.4 Depth: (cm) 1.4 Volume: (cm) 1.847 Character of Wound/Ulcer Post Debridement: Improved Severity of Tissue Post Debridement: Fat layer exposed Post Procedure Diagnosis Same as Pre-procedure Electronic Signature(s) Signed: 05/20/2019 5:27:06 PM By: Zandra Abts RN, BSN Signed: 05/20/2019 5:33:20 PM By: Baltazar Najjar MD Entered By: Baltazar Najjar on 05/20/2019 14:00:29 -------------------------------------------------------------------------------- Debridement Details Patient Name: Date of Service: [name], [name] 05/20/2019  12:45 PM Medical Record Number: [id number] Patient Account Number: [id number] Date of Birth/Sex: Treating RN: 16-Mar-1955 (64 y.o. [name] Primary Care Provider: MA SO UDI, [place] MA LSA DA T Other Clinician: Referring Provider: Treating Provider/Extender: Baltazar Najjar MA SO UDI, ELHA MA LSA DA T Weeks in Treatment: 16 Debridement Performed for Assessment: Wound #9 Right,Medial Foot Performed By: Physician [name], MD Debridement Type: Debridement Severity of Tissue Pre Debridement: Fat layer exposed Level of Consciousness (Pre-procedure): Awake and Alert Pre-procedure Verification/Time Out Yes - 13:30 Taken: Start Time: 13:30 Pain Control: Other : Benzocaine 20% T Area Debrided (L x W): otal 3.6 (cm) x 2.6 (cm) = 9.36 (cm) Tissue and other material debrided: Viable, Non-Viable, Callus, Subcutaneous Level: Skin/Subcutaneous Tissue Debridement Description: Excisional Instrument: Blade, Curette Bleeding: Moderate Hemostasis Achieved: Silver Nitrate End Time: 13:33 Procedural Pain: 0 Post Procedural Pain: 0 Response to Treatment: Procedure was tolerated well Level of Consciousness (Post- Awake and Alert procedure): Post Debridement Measurements of Total Wound Length: (cm) 3.6 Width: (cm) 2.6 Depth: (cm) 0.3 Volume: (cm) 2.205 Character of Wound/Ulcer Post Debridement: Improved Severity of Tissue Post Debridement: Fat layer exposed Post Procedure Diagnosis Same as Pre-procedure Electronic Signature(s) Signed: 05/20/2019 5:27:06 PM By: Zandra Abts RN, BSN Signed: 05/20/2019 5:33:20 PM By: Baltazar Najjar MD Entered By: Baltazar Najjar on 05/20/2019 14:00:40 -------------------------------------------------------------------------------- HPI Details Patient Name: Date of Service: [name], [name] 05/20/2019 12:45 PM Medical Record Number: [id number] Patient Account Number: [id number] Date of Birth/Sex: Treating RN: 04-13-1955 (64 y.o. [name] Primary Care Provider: MA SO UDI, [place] MA LSA DA T Other Clinician: Referring Provider: Treating Provider/Extender: Baltazar Najjar MA SO UDI, ELHA MA LSA DA T Weeks in Treatment: 16 History of Present Illness HPI Description: 65 yrs old bf here for follow up recurrent left charcot foot ulcer. She has DM. She is not a smoker. She states a blister developed 3 months ago at site of previous breakdown from 1 year ago.  It was debrided at the podiatrist's office. She went to the ER and then sent here. She denies pain. Xray was negative for osteo. Culture from this clinic negative. 09/08/14 Referred by [name] to [name] for Charcot foot. Seen by him and MRI scheduled 09/19/14. Prior silver collagen but this was not ordered last visit, patient has been rinsing with saline alone. Re institute silver collagen every other day. F/u 2 weeks with [name] as Labor Day holiday. Supplies ordered. 09/25/14; this is a patient with a Charcot foot on the left. she has a wound over the plantar aspect of her left calcaneus. She has been seen by [name] of orthopedic [name] and has had an MRI. She is apparently going within the next week or 2 for corrective [name] which will involve an external fixator. I don't have any of the details of this. 10/06/14; The patient is a 64 yrs old bf with a left Charcot foot and ulcer on the plantar. 12/01/14 Returns post [name] with [name]. Has follow up visit later this week with him, currently in facility and NWB over this extremity. States no drainage from foot and no current wound care. On exam callus and scab in place, suspect healed. Has external fixator in place. Will defer to [name] for management, ok to place moisturizing cream over scabs and callus and she will call if she requires follow up here. READMISSION 01/28/2019 Patient was in the clinic in 2016 for a wound on the plantar aspect of her left calcaneus. Predominantly cared for by Dr.  [name] she was referred to [name] and had corrective [name] for the position of her left ankle I believe. She had previously been here in 2015 and then prior to that in 2011 2012 that I do not have these records. More recently she has developed fairly extensive wounds on the right medial foot, left lateral foot and a small area on the right medial great toe. Right medial foot wound has been there for 6 to 7 months, left foot wound for 2 to 3 months and the right great toe only over the last few weeks. She has been followed by [name] at Dr. Laverta [place] office it sounds as though she has been undergoing callus paring and silver nitrate. The patient has been washing these off with soap and water and plan applying dry dressing Past medical history, type 2 diabetes well controlled with a recent hemoglobin A1c of 7 on 11/16, type 2 diabetes with peripheral neuropathy, previous left Charcot joint [name] bilateral Charcot joints currently, stage III chronic renal failure, hypertension, obstructive sleep apnea, history of MRSA and gastroesophageal reflux. 1/18; this is a patient with bilateral Charcot feet. Plain x-rays that I did of both of these wounds that are either at bone or precariously close to bone were done. On the right foot this showed possible lytic destruction involving the anterior aspect of the talus which may represent a wound osteomyelitis. An MRI was recommended. On the left there was no definite lytic destruction although the wound is deep undermining's and I think probably requires an MRI on this basis in any case 1/25; patient was supposed to have her MRI last Friday of her bilateral feet however they would not do it because there was silver alginate in her dressings, will replace with calcium alginate today and will see if we can get her done today 2/1; patient did not get her MRIs done last week because of issues with the MRI  department receiving orders to do bilateral  feet. She has 2 deep wounds in the setting of Charcot feet deformities bilaterally. Both of them at one point had exposed bone although I had trouble demonstrating that today. I am not sure that we can offload these very aggressively as I watched her leave the clinic last week her gait is very narrow and unsteady. 2/15; the MRI of her right foot did not show osteomyelitis potentially osteonecrosis of the talus. 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 The wounds are a large wound on the right medial foot and on the left a substantial wound on the left lateral plantar calcaneus. We have been using silver alginate. Ultimately I think we need to consider a total contact cast on the right while we work through the possibility of osteomyelitis in the left. I watched her walk out with her crutches I have some trepidation about the ability to walk with the cast on her right foot. Nevertheless with her Charcot deformities I do not think there is a way to heal these short of offloading them aggressively. 2/22; the culture of the bone in the left foot that I did last week showed a few Citrobacter Koseri as well as some streptococcal species. In my attempted bone for pathology did not actually yield a lot of bone the pathologist did not identify any osteomyelitis. 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 Is promised today and aggressive debridement of the right foot and a total contact cast which surprisingly she seemed to tolerate quite well 03/13/2019 upon evaluation today patient appears to be doing well with her total contact cast she is here for the first cast change. She had no areas of rubbing or any other complications at this point. Overall things are doing quite well. 2/26; the patient was kindly seen by Dr.  Orvan Falconer of infectious disease on 2/25. She is now on IV vancomycin PICC line placement in 2 days and oral Flagyl. This was in response to her MRI showing osteomyelitis of the left calcaneus concerning for osteomyelitis. We have been putting her right leg in a total contact cast. Our nurses report drainage. She is tolerating the total contact cast well. 3/4; the patient is started her IV antibiotics and is taking IV vancomycin and oral Flagyl. She appears to be tolerating these both well. This is for osteomyelitis involving the left calcaneus. The area on the right in the setting of bilateral Charcot deformities did not have any bone infection on MRI. We put a total contact cast on her with silver alginate as the primary dressing and she returns today in follow-up. Per our intake nurse there was too much drainage to leave this on all week therefore we will bring her back for an early change 3/8; follow-up total contact cast she is still having a lot of drainage probably too much drainage from the right foot wound will week with the same cast. She will be back on Thursday. The area on the left looks somewhat better 3/11; patient here for a total contact cast change. 3/15; patient came in for wound care evaluation. She is still on IV vancomycin and oral Flagyl for osteomyelitis in the left foot. The area on the right foot we are putting in a total contact cast. 3/18 for total contact  cast change on the right foot 3/22; the patient was here for wound review today. The area on the right foot is slightly smaller in terms of surface area. However the surface of the wound is very gritty and hyper granulated. More problematically the area on the plantar left foot has increased depth indeed in the center of this it probes to bone. We have been using Hydrofera Blue in both areas. She is on antibiotics as prescribed by infectious disease IV vancomycin and oral Flagyl 3/25; the patient is in for total contact  cast change. Our intake nurse was concerned about the amount of drainage which soaked right to the multiple layers we are putting on this. Wondered about options. The wound bed actually looks as healthy as this is looked although there may not be a lot of change in dimensions things are looking a lot better. If we are going to heal this woman's wound which is in the middle of her right Charcot foot this is going to need to be offloaded. There are not many alternatives 3/29; still not a lot of improvement although the surface of the right wound looks better. We have been using Hydrofera Blue 04/17/2019 upon evaluation today patient appears to be doing well with a total contact cast. With actually having to change this twice a week. She saw Dr. Dellia Nims for wound care earlier in the week this is for the cast change today. That is due to the fact that she has significant drainage. Overall the wound is been looking excellent however. 4/5; we continue to put a total contact cast on this patient with Hydrofera Blue. Still a lot of drainage which precludes a simple weekly change or changing this twice a week. 4/8; total contact cast changed today. Apparently the wound looks better per our intake nurse 4/12; patient here for wound care evaluation. Wounds on the right foot have not changed in dimension none about a month left foot looks similar. Apparently her antibiotics that we are giving her for osteomyelitis in the left foot complete on Wednesday. We have been using Hydrofera Blue on the right foot under a total contact cast. Nurses report greenish drainage although I think this may be discoloration from the [facility] 4/15; back for cast change wounds look quite good although still moderate amount of drainage. T contact cast reapplied otal 4/19;. Wound evaluation. Wound on the right foot is smaller surface looks healthy. There is still the divot in the middle part of this wound but I could not feel any  bone. Area on the left foot also looks better She has completed her antibiotics but still has the PICC line in. 4/23; patient comes back for a total contact cast change this was done in the standard fashion no issues 4/26; wound measures slightly larger on the right foot which is disappointing. She sees Dr. Megan Salon with regards to her antibiotics on Wednesday. I believe she is on vancomycin IV and oral Flagyl. 4/29; in for her routine total contact cast change. She saw Dr. Megan Salon this morning. He is asking about antibiotic continuation I will be in touch with him. I did not look at her wound today 5/30; patient saw Dr. Megan Salon on 4/28. I did send him a note asking about the continuation of perhaps an oral regimen. I have not heard back. She still has an elevated sedimentation rate at 81. Her wounds are smaller. Electronic Signature(s) Signed: 05/20/2019 5:33:20 PM By: Linton Ham MD Entered By: Linton Ham on  05/20/2019 14:02:00 -------------------------------------------------------------------------------- Physical Exam Details Patient Name: Date of Service: [name], [name] 05/20/2019 12:45 PM Medical Record Number: [id number] Patient Account Number: [id number] Date of Birth/Sex: Treating RN: 1955-12-28 (64 y.o. [name] Primary Care Provider: MA SO UDI, [place] MA LSA DA T Other Clinician: Referring Provider: Treating Provider/Extender: Baltazar Najjar MA SO UDI, ELHA MA LSA DA T Weeks in Treatment: 16 Constitutional Sitting or standing Blood Pressure is within target range for patient.. Pulse regular and within target range for patient.Marland Kitchen Respirations regular, non-labored and within target range.. Temperature is normal and within the target range for the patient.Marland Kitchen Appears in no distress. Notes Wound exam; aggressive debridement on the right including removal of thick skin and callus from the wound edge and hyper granulated nonviable tissue over the wound itself.  Hemostasis with silver nitrate On the left the tissue looked healthy at the base of the wound. We removed skin and callus from the wound margin and debris from the wound surface. There is a tunneling area at 10:00 about the same as last week at 1.5 cm Electronic Signature(s) Signed: 05/20/2019 5:33:20 PM By: Baltazar Najjar MD Entered By: Baltazar Najjar on 05/20/2019 14:02:55 -------------------------------------------------------------------------------- Physician Orders Details Patient Name: Date of Service: [name], [name] 05/20/2019 12:45 PM Medical Record Number: [id number] Patient Account Number: [id number] Date of Birth/Sex: Treating RN: [birth year] (64 y.o. [name] Primary Care Provider: MA SO UDI, [place] MA LSA DA T Other Clinician: Referring Provider: Treating Provider/Extender: Baltazar Najjar MA SO UDI, ELHA MA LSA DA T Weeks in Treatment: 34 Verbal / Phone Orders: No Diagnosis Coding ICD-10 Coding Code Description E11.621 Type 2 diabetes mellitus with foot ulcer L97.524 Non-pressure chronic ulcer of other part of left foot with necrosis of bone L97.511 Non-pressure chronic ulcer of other part of right foot limited to breakdown of skin L97.514 Non-pressure chronic ulcer of other part of right foot with necrosis of bone E11.42 Type 2 diabetes mellitus with diabetic polyneuropathy M14.671 Charcot's joint, right ankle and foot M14.672 Charcot's joint, left ankle and foot 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 - MD visit Return A ppointment in: - Thursday or Friday for cast change Return A Dressing Change Frequency Wound #10 Left,Lateral,Plantar Foot Change Dressing every other day. Wound #9 Right,Medial Foot Do not change entire dressing for one week. Wound Cleansing May shower with protection. Primary Wound Dressing Wound #10 Left,Lateral,Plantar Foot Calcium Alginate with Silver Wound #9  Right,Medial Foot Hydrofera Blue Secondary Dressing Wound #10 Left,Lateral,Plantar Foot Kerlix/Rolled Gauze Dry Gauze Other: - felt callous pad Wound #9 Right,Medial Foot Kerlix/Rolled Gauze Zetuvit or Kerramax Drawtex Carboflex Off-Loading Total Contact Cast to Right Lower Extremity Open toe surgical shoe to: - add felt to sandal also Electronic Signature(s) Signed: 05/20/2019 5:27:06 PM By: Zandra Abts RN, BSN Signed: 05/20/2019 5:33:20 PM By: Baltazar Najjar MD Entered By: Zandra Abts on 05/20/2019 13:38:47 -------------------------------------------------------------------------------- Problem List Details Patient Name: Date of Service: [name], [name] 05/20/2019 12:45 PM Medical Record Number: [id number] Patient Account Number: [id number] Date of Birth/Sex: Treating RN: 02-07-1955 (64 y.o. [name] Primary Care Provider: MA SO UDI, [place] MA LSA DA T Other Clinician: Referring Provider: Treating Provider/Extender: Abe People, ELHA MA LSA DA T Weeks in Treatment: 18 Active Problems ICD-10 Encounter Code Description Active Date MDM Diagnosis E11.621 Type 2 diabetes mellitus with foot ulcer 01/28/2019 No Yes L97.524 Non-pressure chronic ulcer of other part of left foot  with necrosis of bone 01/28/2019 No Yes L97.511 Non-pressure chronic ulcer of other part of right foot limited to breakdown of 01/28/2019 No Yes skin L97.514 Non-pressure chronic ulcer of other part of right foot with necrosis of bone 01/28/2019 No Yes E11.42 Type 2 diabetes mellitus with diabetic polyneuropathy 01/28/2019 No Yes M14.671 Charcot's joint, right ankle and foot 01/28/2019 No Yes M14.672 Charcot's joint, left ankle and foot 01/28/2019 No Yes 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 05/20/2019 12:45 PM Medical Record Number: [id number] Patient Account Number: [id number] Date of Birth/Sex: Treating RN: 05/28/1955 (64 y.o. [name] Primary Care Provider: MA SO UDI, [place] MA LSA DA T Other Clinician: Referring Provider: Treating Provider/Extender: Baltazar Najjar MA SO UDI, ELHA MA LSA DA T Weeks in Treatment: 16 Subjective History of Present Illness (HPI) 64 yrs old bf here for follow up recurrent left charcot foot ulcer. She has DM. She is not a smoker. She states a blister developed 3 months ago at site of previous breakdown from 1 year ago. It was debrided at the podiatrist's office. She went to the ER and then sent here. She denies pain. Xray was negative for osteo. Culture from this clinic negative. 09/08/14 Referred by [name] to [name] for Charcot foot. Seen by him and MRI scheduled 09/19/14. Prior silver collagen but this was not ordered last visit, patient has been rinsing with saline alone. Re institute silver collagen every other day. F/u 2 weeks with [name] as Labor Day holiday. Supplies ordered. 09/25/14; this is a patient with a Charcot foot on the left. she has a wound over the plantar aspect of her left calcaneus. She has been seen by [name] of orth[name] and has had an MRI. She is apparently going within the next week or 2 for corrective [name] which will involve an external fixator. I don't have any of the details of this. 10/06/14; The patient is a 64 yrs old bf with a left Charcot foot and ulcer on the plantar. 12/01/14 Returns post [name] with [name]. Has follow up visit later this week with him, currently in facility and NWB over this extremity. States no drainage from foot and no current wound care. On exam callus and scab in place, suspect healed. Has  external fixator in place. Will defer to [name] for management, ok to place moisturizing cream over scabs and callus and she will call if she requires follow up here. READMISSION 01/28/2019 Patient was in the clinic in 2016 for a wound on the plantar aspect of her left calcaneus. Predominantly cared for by [name] she was referred to [name] and had corrective [name] for the position of her left ankle I believe. She had previously been here in 2015 and then prior to that in 2011 2012 that I do not have these records. More recently she has developed fairly extensive wounds on the right medial foot, left lateral foot and a small area on the right medial great toe. Right medial foot wound has been there for 6 to 7 months, left foot wound for 2 to 3 months and the right great toe only over the last few weeks. She has been followed by [name] at Dr. Laverta [place] office it sounds as though she  has been undergoing callus paring and silver nitrate. The patient has been washing these off with soap and water and plan applying dry dressing Past medical history, type 2 diabetes well controlled with a recent hemoglobin A1c of 7 on 11/16, type 2 diabetes with peripheral neuropathy, previous left Charcot joint [name] bilateral Charcot joints currently, stage III chronic renal failure, hypertension, obstructive sleep apnea, history of MRSA and gastroesophageal reflux. 1/18; this is a patient with bilateral Charcot feet. Plain x-rays that I did of both of these wounds that are either at bone or precariously close to bone were done. On the right foot this showed possible lytic destruction involving the anterior aspect of the talus which may represent a wound osteomyelitis. An MRI was recommended. On the left there was no definite lytic destruction although the wound is deep undermining's and I think probably requires an MRI on this basis in any case 1/25; patient was supposed to have her MRI last  Friday of her bilateral feet however they would not do it because there was silver alginate in her dressings, will replace with calcium alginate today and will see if we can get her done today 2/1; patient did not get her MRIs done last week because of issues with the MRI department receiving orders to do bilateral feet. She has 2 deep wounds in the setting of Charcot feet deformities bilaterally. Both of them at one point had exposed bone although I had trouble demonstrating that today. I am not sure that we can offload these very aggressively as I watched her leave the clinic last week her gait is very narrow and unsteady. 2/15; the MRI of her right foot did not show osteomyelitis potentially osteonecrosis of the talus. 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 The wounds are a large wound on the right medial foot and on the left a substantial wound on the left lateral plantar calcaneus. We have been using silver alginate. Ultimately I think we need to consider a total contact cast on the right while we work through the possibility of osteomyelitis in the left. I watched her walk out with her crutches I have some trepidation about the ability to walk with the cast on her right foot. Nevertheless with her Charcot deformities I do not think there is a way to heal these short of offloading them aggressively. 2/22; the culture of the bone in the left foot that I did last week showed a few Citrobacter Koseri as well as some streptococcal species. In my attempted bone for pathology did not actually yield a lot of bone the pathologist did not identify any osteomyelitis. 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 Is promised today and aggressive debridement of the right foot and a total contact cast which  surprisingly she seemed to tolerate quite well 03/13/2019 upon evaluation today patient appears to be doing well with her total contact cast she is here for the first cast change. She had no areas of rubbing or any other complications at this point. Overall things are doing quite well. 2/26; the patient was kindly seen by Dr. Orvan Falconer of infectious disease on 2/25. She is now on IV vancomycin PICC line placement in 2 days and oral Flagyl. This was in response to her MRI showing osteomyelitis of the left calcaneus concerning  for osteomyelitis. We have been putting her right leg in a total contact cast. Our nurses report drainage. She is tolerating the total contact cast well. 3/4; the patient is started her IV antibiotics and is taking IV vancomycin and oral Flagyl. She appears to be tolerating these both well. This is for osteomyelitis involving the left calcaneus. The area on the right in the setting of bilateral Charcot deformities did not have any bone infection on MRI. We put a total contact cast on her with silver alginate as the primary dressing and she returns today in follow-up. Per our intake nurse there was too much drainage to leave this on all week therefore we will bring her back for an early change 3/8; follow-up total contact cast she is still having a lot of drainage probably too much drainage from the right foot wound will week with the same cast. She will be back on Thursday. The area on the left looks somewhat better 3/11; patient here for a total contact cast change. 3/15; patient came in for wound care evaluation. She is still on IV vancomycin and oral Flagyl for osteomyelitis in the left foot. The area on the right foot we are putting in a total contact cast. 3/18 for total contact cast change on the right foot 3/22; the patient was here for wound review today. The area on the right foot is slightly smaller in terms of surface area. However the surface of the wound is very  gritty and hyper granulated. More problematically the area on the plantar left foot has increased depth indeed in the center of this it probes to bone. We have been using Hydrofera Blue in both areas. She is on antibiotics as prescribed by infectious disease IV vancomycin and oral Flagyl 3/25; the patient is in for total contact cast change. Our intake nurse was concerned about the amount of drainage which soaked right to the multiple layers we are putting on this. Wondered about options. The wound bed actually looks as healthy as this is looked although there may not be a lot of change in dimensions things are looking a lot better. If we are going to heal this woman's wound which is in the middle of her right Charcot foot this is going to need to be offloaded. There are not many alternatives 3/29; still not a lot of improvement although the surface of the right wound looks better. We have been using Hydrofera Blue 04/17/2019 upon evaluation today patient appears to be doing well with a total contact cast. With actually having to change this twice a week. She saw [name] for wound care earlier in the week this is for the cast change today. That is due to the fact that she has significant drainage. Overall the wound is been looking excellent however. 4/5; we continue to put a total contact cast on this patient with Hydrofera Blue. Still a lot of drainage which precludes a simple weekly change or changing this twice a week. 4/8; total contact cast changed today. Apparently the wound looks better per our intake nurse 4/12; patient here for wound care evaluation. Wounds on the right foot have not changed in dimension none about a month left foot looks similar. Apparently her antibiotics that we are giving her for osteomyelitis in the left foot complete on Wednesday. We have been using Hydrofera Blue on the right foot under a total contact cast. Nurses report greenish drainage although I think this may  be discoloration from the [facility]  Blue 4/15; back for cast change wounds look quite good although still moderate amount of drainage. T contact cast reapplied otal 4/19;. Wound evaluation. Wound on the right foot is smaller surface looks healthy. There is still the divot in the middle part of this wound but I could not feel any bone. Area on the left foot also looks better She has completed her antibiotics but still has the PICC line in. 4/23; patient comes back for a total contact cast change this was done in the standard fashion no issues 4/26; wound measures slightly larger on the right foot which is disappointing. She sees Dr. Orvan Falconer with regards to her antibiotics on Wednesday. I believe she is on vancomycin IV and oral Flagyl. 4/29; in for her routine total contact cast change. She saw Dr. Orvan Falconer this morning. He is asking about antibiotic continuation I will be in touch with him. I did not look at her wound today 5/30; patient saw Dr. Orvan Falconer on 4/28. I did send him a note asking about the continuation of perhaps an oral regimen. I have not heard back. She still has an elevated sedimentation rate at 81. Her wounds are smaller. Objective Constitutional Sitting or standing Blood Pressure is within target range for patient.. Pulse regular and within target range for patient.Marland Kitchen Respirations regular, non-labored and within target range.. Temperature is normal and within the target range for the patient.Marland Kitchen Appears in no distress. Vitals Time Taken: 12:52 PM, Height: 68 in, Weight: 265 lbs, BMI: 40.3, Temperature: 97.6 F, Pulse: 77 bpm, Respiratory Rate: 18 breaths/min, Blood Pressure: 124/83 mmHg, Capillary Blood Glucose: 127 mg/dl. General Notes: Wound exam; aggressive debridement on the right including removal of thick skin and callus from the wound edge and hyper granulated nonviable tissue over the wound itself. Hemostasis with silver nitrate ooOn the left the tissue looked healthy at  the base of the wound. We removed skin and callus from the wound margin and debris from the wound surface. There is a tunneling area at 10:00 about the same as last week at 1.5 cm Integumentary (Hair, Skin) Wound #10 status is Open. Original cause of wound was Blister. The wound is located on the Left,Lateral,Plantar Foot. The wound measures 1.2cm length x 1.4cm width x 1.4cm depth; 1.319cm^2 area and 1.847cm^3 volume. There is muscle and Fat Layer (Subcutaneous Tissue) Exposed exposed. There is no undermining noted, however, there is tunneling at 9:00 with a maximum distance of 1.4cm. There is a medium amount of serosanguineous drainage noted. The wound margin is thickened. There is large (67-100%) red, pink granulation within the wound bed. There is no necrotic tissue within the wound bed. Wound #9 status is Open. Original cause of wound was Blister. The wound is located on the Right,Medial Foot. The wound measures 3.6cm length x 2.6cm width x 0.3cm depth; 7.351cm^2 area and 2.205cm^3 volume. There is Fat Layer (Subcutaneous Tissue) Exposed exposed. There is no tunneling or undermining noted. There is a large amount of serosanguineous drainage noted. The wound margin is thickened. There is large (67-100%) red, pink, hyper - granulation within the wound bed. There is no necrotic tissue within the wound bed. 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 There was a Excisional Skin/Subcutaneous Tissue Debridement with a total area of 1.68 sq cm performed by [name], MD. With the following instrument(s): Curette to remove Viable and Non-Viable tissue/material. Material removed includes Callus and Subcutaneous Tissue and after achieving pain control using Other (Benzocaine 20%). No specimens were taken. A time out was conducted at 13:30, prior to the start of the procedure. A Moderate amount of bleeding was controlled with Pressure. The procedure was tolerated well with a pain level of 0 throughout and a pain level of 0 following the procedure. Post Debridement Measurements: 1.2cm length x 1.4cm width x 1.4cm depth; 1.847cm^3 volume. Character of Wound/Ulcer Post Debridement is improved. Severity of Tissue Post Debridement is: Fat layer exposed. Post procedure Diagnosis Wound #10: Same as Pre-Procedure Wound #9 Pre-procedure diagnosis of Wound #9 is a Diabetic Wound/Ulcer of the Lower Extremity located on the Right,Medial Foot .Severity of Tissue Pre Debridement is: Fat layer exposed. There was a Excisional Skin/Subcutaneous Tissue Debridement with a total area of 9.36 sq cm performed by [name], MD. With the following instrument(s): Blade, and Curette to remove Viable and Non-Viable tissue/material. Material removed includes Callus and Subcutaneous Tissue and after achieving pain control using Other (Benzocaine 20%). No specimens were taken. A time out was conducted at 13:30, prior to the start of the procedure. A Moderate amount of bleeding was controlled with Silver Nitrate. The procedure was tolerated well with a pain level of 0 throughout and a pain level of 0 following the procedure. Post Debridement Measurements: 3.6cm length x 2.6cm width x 0.3cm depth; 2.205cm^3 volume. Character of Wound/Ulcer Post Debridement is  improved. Severity of Tissue Post Debridement is: Fat layer exposed. Post procedure Diagnosis Wound #9: Same as Pre-Procedure Pre-procedure diagnosis of Wound #9 is a Diabetic Wound/Ulcer of the Lower Extremity located on the Right,Medial Foot . There was a T Contact Cast otal Procedure by [name], MD. Post procedure Diagnosis Wound #9: Same as Pre-Procedure Plan Follow-up Appointments: Return Appointment in 1 week. - MD visit Return Appointment in: - Thursday or Friday for cast change Dressing Change Frequency: Wound #10 Left,Lateral,Plantar Foot: Change Dressing every other day. Wound #9 Right,Medial Foot: Do not change entire dressing for one week. Wound Cleansing: May shower with protection. Primary Wound Dressing: Wound #10 Left,Lateral,Plantar Foot: Calcium Alginate with Silver Wound #9 Right,Medial Foot: Hydrofera Blue Secondary Dressing: Wound #10 Left,Lateral,Plantar Foot: Kerlix/Rolled Gauze Dry Gauze Other: - felt callous pad Wound #9 Right,Medial Foot: Kerlix/Rolled Gauze Zetuvit or Kerramax Drawtex Carboflex Off-Loading: T Contact Cast to Right Lower Extremity otal Open toe surgical shoe to: - add felt to sandal also 1. We are using Hydrofera Blue on the right medial foot under a total contact cast this will continue 2. Still awaiting to see what Dr. Blair Dolphin final decision about antibiotics I was hoping for an oral regimen and I communicated this because of the elevated sedimentation rate although there may be other reasons for this 3. We continued with silver alginate on the left lateral foot with careful instructions to pack this in to the tunnel at 10:00 Electronic Signature(s) Signed: 05/20/2019 5:33:20 PM By: Baltazar Najjar MD Entered By: Baltazar Najjar on 05/20/2019 14:03:58 -------------------------------------------------------------------------------- Total Contact Cast  Details Patient Name: Date of Service: [name], [name]  05/20/2019 12:45 PM Medical Record Number: [id number] Patient Account Number: [id number] Date of Birth/Sex: Treating RN: January 12, 1956 (64 y.o. [name] Primary Care Provider: MA SO UDI, Judene Companion MA LSA DA T Other Clinician: Referring Provider: Treating Provider/Extender: Baltazar Najjar MA SO UDI, ELHA MA LSA DA T Weeks in Treatment: 16 T Contact Cast Applied for Wound Assessment: otal Wound #9 Right,Medial Foot Performed By: Physician [name], MD Post Procedure Diagnosis Same as Pre-procedure Electronic Signature(s) Signed: 05/20/2019 5:33:20 PM By: Baltazar Najjar MD Entered By: Baltazar Najjar on 05/20/2019 14:00:54 -------------------------------------------------------------------------------- SuperBill Details Patient Name: Date of Service: [name], Frankee J. 05/20/2019 Medical Record Number: [id number] Patient Account Number: [id number] Date of Birth/Sex: Treating RN: [birth year] (64 y.o. [name] Primary Care Provider: MA SO UDI, [place] MA LSA DA T Other Clinician: Referring Provider: Treating Provider/Extender: Abe People, ELHA MA LSA DA T Weeks in Treatment: 16 Diagnosis Coding ICD-10 Codes Code Description E11.621 Type 2 diabetes mellitus with foot ulcer L97.524 Non-pressure chronic ulcer of other part of left foot with necrosis of bone L97.511 Non-pressure chronic ulcer of other part of right foot limited to breakdown of skin L97.514 Non-pressure chronic ulcer of other part of right foot with necrosis of bone E11.42 Type 2 diabetes mellitus with diabetic polyneuropathy M14.671 Charcot's joint, right ankle and foot M14.672 Charcot's joint, left ankle and foot 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, left ankle and foot Facility Procedures CPT4 Code: [id number] Description: 11042 - DEB SUBQ TISSUE 20 SQ CM/< ICD-10 Diagnosis Description L97.511 Non-pressure chronic ulcer of other part of right foot limited to breakdown of L97.524 Non-pressure  chronic ulcer of other part of left foot with necrosis of bone Modifier: skin Quantity: 1 Physician Procedures : CPT4 Code Description Modifier [id number] 11042 - WC PHYS SUBQ TISS 20 SQ CM ICD-10 Diagnosis Description L97.511 Non-pressure chronic ulcer of other part of right foot limited to breakdown of skin L97.524 Non-pressure chronic ulcer of other part of left  foot with necrosis of bone Quantity: 1 Electronic Signature(s) Signed: 05/20/2019 5:33:20 PM By: Baltazar Najjar MD Entered By: Baltazar Najjar on 05/20/2019 14:04:27

## 2019-05-23 ENCOUNTER — Encounter (HOSPITAL_BASED_OUTPATIENT_CLINIC_OR_DEPARTMENT_OTHER): Payer: Medicare Other | Admitting: Internal Medicine

## 2019-05-23 DIAGNOSIS — E11621 Type 2 diabetes mellitus with foot ulcer: Secondary | ICD-10-CM | POA: Diagnosis not present

## 2019-05-23 NOTE — Progress Notes (Signed)
[name], [name] ([id number]) Visit Report for 05/23/2019 HPI Details Patient Name: Date of Service: [name], [name] 05/23/2019 12:45 PM Medical Record Number: [id number] Patient Account Number: [id number] Date of Birth/Sex: Treating RN: [birth year] (64 y.o. [name] Primary Care Provider: MA SO UDI, [place] MA LSA DA T Other Clinician: Referring Provider: Treating Provider/Extender: Baltazar Najjar MA SO UDI, ELHA MA LSA DA T Weeks in Treatment: 16 History of Present Illness HPI Description: 64 yrs old bf here for follow up recurrent left charcot foot ulcer. She has DM. She is not a smoker. She states a blister developed 3 months ago at site of previous breakdown from 1 year ago. It was debrided at the podiatrist's office. She went to the ER and then sent here. She denies pain. Xray was negative for osteo. Culture from this clinic negative. 09/08/[name] to [name] for Charcot foot. Seen by him and MRI scheduled 09/19/14. Prior silver collagen but this was not ordered last visit, patient has been rinsing with saline alone. Re institute silver collagen every other day. F/u 2 weeks with [name] as Labor Day holiday. Supplies ordered. 09/25/14; this is a patient with a Charcot foot on the left. she has a wound over the plantar aspect of her left calcaneus. She has been seen by [name] of orthopedic surgery and has had an MRI. She is apparently going within the next week or 2 for corrective surgery which will involve an external fixator. I don't have any of the details of this. 10/06/14; The patient is a 64 yrs old bf with a left Charcot foot and ulcer on the plantar. 12/01/14 Returns post surgery with [name]. Has follow up visit later this week with him, currently in facility and NWB over this extremity. States no drainage from foot and no current wound care. On exam callus and scab in place, suspect healed. Has external fixator in place. Will defer to [name]  for management, ok to place moisturizing cream over scabs and callus and she will call if she requires follow up here. READMISSION 01/28/2019 Patient was in the clinic in 2016 for a wound on the plantar aspect of her left calcaneus. Predominantly cared for by [name] she was referred to [name] and had corrective surgery for the position of her left ankle I believe. She had previously been here in 2015 and then prior to that in 2011 2012 that I do not have these records. More recently she has developed fairly extensive wounds on the right medial foot, left lateral foot and a small area on the right medial great toe. Right medial foot wound has been there for 6 to 7 months, left foot wound for 2 to 3 months and the right great toe only over the last few weeks. She has been followed by [name] at Dr. Laverta [place] office it sounds as though she has been undergoing callus paring and silver nitrate. The patient has been washing these off with soap and water and plan applying dry dressing Past medical history, type 2 diabetes well controlled with a recent hemoglobin A1c of 7 on 11/16, type 2 diabetes with peripheral neuropathy, previous left Charcot joint surgery bilateral Charcot joints currently, stage III chronic renal failure, hypertension, obstructive sleep apnea, history of MRSA and gastroesophageal reflux. 1/18; this is a patient with bilateral Charcot feet. Plain x-rays that I did of both of these wounds that are either at bone or precariously close to bone  were done. On the right foot this showed possible lytic destruction involving the anterior aspect of the talus which may represent a wound osteomyelitis. An MRI was recommended. On the left there was no definite lytic destruction although the wound is deep undermining's and I think probably requires an MRI on this basis in any case 1/25; patient was supposed to have her MRI last Friday of her bilateral feet however they would not do  it because there was silver alginate in her dressings, will replace with calcium alginate today and will see if we can get her done today 2/1; patient did not get her MRIs done last week because of issues with the MRI department receiving orders to do bilateral feet. She has 2 deep wounds in the setting of Charcot feet deformities bilaterally. Both of them at one point had exposed bone although I had trouble demonstrating that today. I am not sure that we can offload these very aggressively as I watched her leave the clinic last week her gait is very narrow and unsteady. 2/15; the MRI of her right foot did not show osteomyelitis potentially osteonecrosis of the talus. 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 The wounds are a large wound on the right medial foot and on the left a substantial wound on the left lateral plantar calcaneus. We have been using silver alginate. Ultimately I think we need to consider a total contact cast on the right while we work through the possibility of osteomyelitis in the left. I watched her walk out with her crutches I have some trepidation about the ability to walk with the cast on her right foot. Nevertheless with her Charcot deformities I do not think there is a way to heal these short of offloading them aggressively. 2/22; the culture of the bone in the left foot that I did last week showed a few Citrobacter Koseri as well as some streptococcal species. In my attempted bone for pathology did not actually yield a lot of bone the pathologist did not identify any osteomyelitis. 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 Is promised today and aggressive debridement of the right foot and a total contact cast which surprisingly she seemed to tolerate quite well 03/13/2019 upon  evaluation today patient appears to be doing well with her total contact cast she is here for the first cast change. She had no areas of rubbing or any other complications at this point. Overall things are doing quite well. 2/26; the patient was kindly seen by Dr. Megan Salon of infectious disease on 2/25. She is now on IV vancomycin PICC line placement in 2 days and oral Flagyl. This was in response to her MRI showing osteomyelitis of the left calcaneus concerning for osteomyelitis. We have been putting her right leg in a total contact cast. Our nurses report drainage. She is tolerating the total contact cast well. 3/4; the patient is started her IV antibiotics and is taking IV vancomycin and oral Flagyl. She appears to be tolerating these both well. This is for osteomyelitis involving the left calcaneus. The area on the right in the setting of bilateral Charcot deformities did not have any bone infection on MRI. We put a total contact cast on her with silver alginate as the primary dressing and she returns today in follow-up. Per  our intake nurse there was too much drainage to leave this on all week therefore we will bring her back for an early change 3/8; follow-up total contact cast she is still having a lot of drainage probably too much drainage from the right foot wound will week with the same cast. She will be back on Thursday. The area on the left looks somewhat better 3/11; patient here for a total contact cast change. 3/15; patient came in for wound care evaluation. She is still on IV vancomycin and oral Flagyl for osteomyelitis in the left foot. The area on the right foot we are putting in a total contact cast. 3/18 for total contact cast change on the right foot 3/22; the patient was here for wound review today. The area on the right foot is slightly smaller in terms of surface area. However the surface of the wound is very gritty and hyper granulated. More problematically the area on the  plantar left foot has increased depth indeed in the [name] of this it probes to bone. We have been using Hydrofera Blue in both areas. She is on antibiotics as prescribed by infectious disease IV vancomycin and oral Flagyl 3/25; the patient is in for total contact cast change. Our intake nurse was concerned about the amount of drainage which soaked right to the multiple layers we are putting on this. Wondered about options. The wound bed actually looks as healthy as this is looked although there may not be a lot of change in dimensions things are looking a lot better. If we are going to heal this woman's wound which is in the middle of her right Charcot foot this is going to need to be offloaded. There are not many alternatives 3/29; still not a lot of improvement although the surface of the right wound looks better. We have been using Hydrofera Blue 04/17/2019 upon evaluation today patient appears to be doing well with a total contact cast. With actually having to change this twice a week. She saw Dr. Dellia Nims for wound care earlier in the week this is for the cast change today. That is due to the fact that she has significant drainage. Overall the wound is been looking excellent however. 4/5; we continue to put a total contact cast on this patient with Hydrofera Blue. Still a lot of drainage which precludes a simple weekly change or changing this twice a week. 4/8; total contact cast changed today. Apparently the wound looks better per our intake nurse 4/12; patient here for wound care evaluation. Wounds on the right foot have not changed in dimension none about a month left foot looks similar. Apparently her antibiotics that we are giving her for osteomyelitis in the left foot complete on Wednesday. We have been using Hydrofera Blue on the right foot under a total contact cast. Nurses report greenish drainage although I think this may be discoloration from the [name] 4/15; back for cast  change wounds look quite good although still moderate amount of drainage. T contact cast reapplied otal 4/19;. Wound evaluation. Wound on the right foot is smaller surface looks healthy. There is still the divot in the middle part of this wound but I could not feel any bone. Area on the left foot also looks better She has completed her antibiotics but still has the PICC line in. 4/23; patient comes back for a total contact cast change this was done in the standard fashion no issues 4/26; wound measures slightly larger on the right  foot which is disappointing. She sees Dr. Orvan Falconer with regards to her antibiotics on Wednesday. I believe she is on vancomycin IV and oral Flagyl. 4/29; in for her routine total contact cast change. She saw Dr. Orvan Falconer this morning. He is asking about antibiotic continuation I will be in touch with him. I did not look at her wound today 5/3; patient saw Dr. Orvan Falconer on 4/28. I did send him a note asking about the continuation of perhaps an oral regimen. I have not heard back. She still has an elevated sedimentation rate at 81. Her wounds are smaller. 5/6; in for her total contact cast change. We did not look at the wound today. She has seen Dr. Orvan Falconer and is now on oral Keflex and Flagyl. She seems to be tolerating these well Electronic Signature(s) Signed: 05/23/2019 5:30:42 PM By: Baltazar Najjar MD Entered By: Baltazar Najjar on 05/23/2019 13:31:49 -------------------------------------------------------------------------------- Physical Exam Details Patient Name: Date of Service: [name], [name] 05/23/2019 12:45 PM Medical Record Number: [id number] Patient Account Number: [id number] Date of Birth/Sex: Treating RN: April 19, 1955 (64 y.o. [name] Primary Care Provider: MA SO UDI, [place] MA LSA DA T Other Clinician: Referring Provider: Treating Provider/Extender: Baltazar Najjar MA SO UDI, ELHA MA LSA DA T Weeks in Treatment: 16 Notes T contact cast  change in the standard fashion. We did not look at the wound today. Should be back on Monday for her regular doctor visit otal Electronic Signature(s) Signed: 05/23/2019 5:30:42 PM By: Baltazar Najjar MD Entered By: Baltazar Najjar on 05/23/2019 13:32:18 -------------------------------------------------------------------------------- Physician Orders Details Patient Name: Date of Service: [facility], Virtie J. 05/23/2019 12:45 PM Medical Record Number: [id number] Patient Account Number: [id number] Date of Birth/Sex: Treating RN: [birth year] (64 y.o. [name] Primary Care Provider: MA SO UDI, [place] MA LSA DA T Other Clinician: Referring Provider: Treating Provider/Extender: Baltazar Najjar MA SO UDI, ELHA MA LSA DA T Weeks in Treatment: 48 Verbal / Phone Orders: No Diagnosis Coding ICD-10 Coding Code Description E11.621 Type 2 diabetes mellitus with foot ulcer L97.524 Non-pressure chronic ulcer of other part of left foot with necrosis of bone L97.511 Non-pressure chronic ulcer of other part of right foot limited to breakdown of skin L97.514 Non-pressure chronic ulcer of other part of right foot with necrosis of bone E11.42 Type 2 diabetes mellitus with diabetic polyneuropathy M14.671 Charcot's joint, right ankle and foot M14.672 Charcot's joint, left ankle and foot 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 - MD visit Return A ppointment in: - Thursday or Friday for cast change Return A Dressing Change Frequency Wound #10 Left,Lateral,Plantar Foot Change Dressing every other day. Wound #9 Right,Medial Foot Do not change entire dressing for one week. Wound Cleansing May shower with protection. Primary Wound Dressing Wound #10 Left,Lateral,Plantar Foot Calcium Alginate with Silver Wound #9 Right,Medial Foot Hydrofera Blue Secondary Dressing Wound #10 Left,Lateral,Plantar Foot Kerlix/Rolled Gauze Dry  Gauze Other: - felt callous pad Wound #9 Right,Medial Foot Kerlix/Rolled Gauze Zetuvit or Kerramax Drawtex Carboflex Off-Loading Total Contact Cast to Right Lower Extremity Open toe surgical shoe to: - add felt to sandal also Electronic Signature(s) Signed: 05/23/2019 5:30:42 PM By: Baltazar Najjar MD Signed: 05/23/2019 5:34:26 PM By: Shawn Stall Entered By: Shawn Stall on 05/23/2019 13:17:22 -------------------------------------------------------------------------------- Problem List Details Patient Name: Date of Service: [name], [name] 05/23/2019 12:45 PM Medical Record Number: [id number] Patient Account Number: [id number] Date of Birth/Sex: Treating RN: 05/26/1955 (64 y.o. [name] Primary Care  Provider: MA Hedy Jacob MA LSA DA T Other Clinician: Referring Provider: Treating Provider/Extender: Abe People, ELHA MA LSA DA T Weeks in Treatment: 44 Active Problems ICD-10 Encounter Code Description Active Date MDM Diagnosis E11.621 Type 2 diabetes mellitus with foot ulcer 01/28/2019 No Yes L97.524 Non-pressure chronic ulcer of other part of left foot with necrosis of bone 01/28/2019 No Yes L97.511 Non-pressure chronic ulcer of other part of right foot limited to breakdown of 01/28/2019 No Yes skin L97.514 Non-pressure chronic ulcer of other part of right foot with necrosis of bone 01/28/2019 No Yes E11.42 Type 2 diabetes mellitus with diabetic polyneuropathy 01/28/2019 No Yes M14.671 Charcot's joint, right ankle and foot 01/28/2019 No Yes M14.672 Charcot's joint, left ankle and foot 01/28/2019 No Yes 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 05/23/2019 12:45 PM Medical Record Number: [id number] Patient Account Number: [id number] Date of Birth/Sex: Treating RN: 06-16-1955 (64 y.o. [name] Primary Care Provider: MA SO UDI, [place] MA LSA DA T Other Clinician: Referring Provider: Treating Provider/Extender: Baltazar Najjar MA SO UDI, ELHA MA LSA DA T Weeks in Treatment: 16 Subjective History of Present Illness (HPI) 64 yrs old bf here for follow up recurrent left charcot foot ulcer. She has DM. She is not a smoker. She states a blister developed 3 months ago at site of previous breakdown from 1 year ago. It was debrided at the podiatrist's office. She went to the ER and then sent here. She denies pain. Xray was negative for osteo. Culture from this clinic negative. 09/08/14 [address]erred [address] [name] to [name] for Charcot foot. Seen by him and MRI scheduled 09/19/14. Prior silver collagen but this was not ordered last visit, patient has been rinsing with saline alone. Re institute silver collagen every other day. F/u 2 weeks with [name] as Labor Day holiday. Supplies ordered. 09/25/14; this is a patient with a Charcot foot on the left. she has a wound over the plantar aspect of her left calcaneus. She has been seen by [name] of orthopedic surgery and has had an MRI. She is apparently going within the next week or 2 for corrective surgery which will involve an external fixator. I don't have any of the details of this. 10/06/14; The patient is a 64 yrs old bf with a left Charcot foot and ulcer on the plantar. 12/01/14 Returns post surgery with [name]. Has follow up visit later this week with him, currently in facility and NWB over this extremity. States no drainage from foot and no current wound care. On exam callus and scab in place, suspect healed. Has external fixator in place. Will defer to [name] for management, ok to place moisturizing cream over scabs and callus and she will call if  she requires follow up here. READMISSION 01/28/2019 Patient was in the clinic in 2016 for a wound on the plantar aspect of her left calcaneus. Predominantly cared for by [name] she was referred to [name] and had corrective surgery for the position of her left ankle I believe. She had previously been here in 2015 and then prior to that in 2011 2012 that I do not have these records. More recently she has developed fairly extensive wounds on the right medial foot,  left lateral foot and a small area on the right medial great toe. Right medial foot wound has been there for 6 to 7 months, left foot wound for 2 to 3 months and the right great toe only over the last few weeks. She has been followed by [name] at Dr. [name] office it sounds as though she has been undergoing callus paring and silver nitrate. The patient has been washing these off with soap and water and plan applying dry dressing Past medical history, type 2 diabetes well controlled with a recent hemoglobin A1c of 7 on 11/16, type 2 diabetes with peripheral neuropathy, previous left Charcot joint surgery bilateral Charcot joints currently, stage III chronic renal failure, hypertension, obstructive sleep apnea, history of MRSA and gastroesophageal reflux. 1/18; this is a patient with bilateral Charcot feet. Plain x-rays that I did of both of these wounds that are either at bone or precariously close to bone were done. On the right foot this showed possible lytic destruction involving the anterior aspect of the talus which may represent a wound osteomyelitis. An MRI was recommended. On the left there was no definite lytic destruction although the wound is deep undermining's and I think probably requires an MRI on this basis in any case 1/25; patient was supposed to have her MRI last Friday of her bilateral feet however they would not do it because there was silver alginate in her dressings, will replace with calcium alginate  today and will see if we can get her done today 2/1; patient did not get her MRIs done last week because of issues with the MRI department receiving orders to do bilateral feet. She has 2 deep wounds in the setting of Charcot feet deformities bilaterally. Both of them at one point had exposed bone although I had trouble demonstrating that today. I am not sure that we can offload these very aggressively as I watched her leave the clinic last week her gait is very narrow and unsteady. 2/15; the MRI of her right foot did not show osteomyelitis potentially osteonecrosis of the talus. 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 The wounds are a large wound on the right medial foot and on the left a substantial wound on the left lateral plantar calcaneus. We have been using silver alginate. Ultimately I think we need to consider a total contact cast on the right while we work through the possibility of osteomyelitis in the left. I watched her walk out with her crutches I have some trepidation about the ability to walk with the cast on her right foot. Nevertheless with her Charcot deformities I do not think there is a way to heal these short of offloading them aggressively. 2/22; the culture of the bone in the left foot that I did last week showed a few Citrobacter Koseri as well as some streptococcal species. In my attempted bone for pathology did not actually yield a lot of bone the pathologist did not identify any osteomyelitis. 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 Is promised today and aggressive debridement of the right foot and a total contact cast which surprisingly she seemed to tolerate quite well 03/13/2019 upon evaluation today patient appears to be doing well with her total contact cast she is here for  the first cast change. She had  no areas of rubbing or any other complications at this point. Overall things are doing quite well. 2/26; the patient was kindly seen by Dr. Orvan Falconer of infectious disease on 2/25. She is now on IV vancomycin PICC line placement in 2 days and oral Flagyl. This was in response to her MRI showing osteomyelitis of the left calcaneus concerning for osteomyelitis. We have been putting her right leg in a total contact cast. Our nurses report drainage. She is tolerating the total contact cast well. 3/4; the patient is started her IV antibiotics and is taking IV vancomycin and oral Flagyl. She appears to be tolerating these both well. This is for osteomyelitis involving the left calcaneus. The area on the right in the setting of bilateral Charcot deformities did not have any bone infection on MRI. We put a total contact cast on her with silver alginate as the primary dressing and she returns today in follow-up. Per our intake nurse there was too much drainage to leave this on all week therefore we will bring her back for an early change 3/8; follow-up total contact cast she is still having a lot of drainage probably too much drainage from the right foot wound will week with the same cast. She will be back on Thursday. The area on the left looks somewhat better 3/11; patient here for a total contact cast change. 3/15; patient came in for wound care evaluation. She is still on IV vancomycin and oral Flagyl for osteomyelitis in the left foot. The area on the right foot we are putting in a total contact cast. 3/18 for total contact cast change on the right foot 3/22; the patient was here for wound review today. The area on the right foot is slightly smaller in terms of surface area. However the surface of the wound is very gritty and hyper granulated. More problematically the area on the plantar left foot has increased depth indeed in the [name] of this it probes to bone. We have  been using Hydrofera Blue in both areas. She is on antibiotics as prescribed by infectious disease IV vancomycin and oral Flagyl 3/25; the patient is in for total contact cast change. Our intake nurse was concerned about the amount of drainage which soaked right to the multiple layers we are putting on this. Wondered about options. The wound bed actually looks as healthy as this is looked although there may not be a lot of change in dimensions things are looking a lot better. If we are going to heal this woman's wound which is in the middle of her right Charcot foot this is going to need to be offloaded. There are not many alternatives 3/29; still not a lot of improvement although the surface of the right wound looks better. We have been using Hydrofera Blue 04/17/2019 upon evaluation today patient appears to be doing well with a total contact cast. With actually having to change this twice a week. She saw [name] for wound care earlier in the week this is for the cast change today. That is due to the fact that she has significant drainage. Overall the wound is been looking excellent however. 4/5; we continue to put a total contact cast on this patient with Hydrofera Blue. Still a lot of drainage which precludes a simple weekly change or changing this twice a week. 4/8; total contact cast changed today. Apparently the wound looks better per our intake nurse 4/12; patient here for wound care evaluation. Wounds on the right foot  have not changed in dimension none about a month left foot looks similar. Apparently her antibiotics that we are giving her for osteomyelitis in the left foot complete on Wednesday. We have been using Hydrofera Blue on the right foot under a total contact cast. Nurses report greenish drainage although I think this may be discoloration from the [name] 4/15; back for cast change wounds look quite good although still moderate amount of drainage. T contact cast  reapplied otal 4/19;. Wound evaluation. Wound on the right foot is smaller surface looks healthy. There is still the divot in the middle part of this wound but I could not feel any bone. Area on the left foot also looks better She has completed her antibiotics but still has the PICC line in. 4/23; patient comes back for a total contact cast change this was done in the standard fashion no issues 4/26; wound measures slightly larger on the right foot which is disappointing. She sees Dr. Orvan Falconer with regards to her antibiotics on Wednesday. I believe she is on vancomycin IV and oral Flagyl. 4/29; in for her routine total contact cast change. She saw Dr. Orvan Falconer this morning. He is asking about antibiotic continuation I will be in touch with him. I did not look at her wound today 5/3; patient saw Dr. Orvan Falconer on 4/28. I did send him a note asking about the continuation of perhaps an oral regimen. I have not heard back. She still has an elevated sedimentation rate at 81. Her wounds are smaller. 5/6; in for her total contact cast change. We did not look at the wound today. She has seen Dr. Orvan Falconer and is now on oral Keflex and Flagyl. She seems to be tolerating these well Objective Constitutional Vitals Time Taken: 12:41 PM, Height: 68 in, Weight: 265 lbs, BMI: 40.3, Temperature: 98.2 F, Pulse: 70 bpm, Respiratory Rate: 18 breaths/min, Blood Pressure: 120/60 mmHg. Integumentary (Hair, Skin) Wound #9 status is Open. Original cause of wound was Blister. The wound is located on the Right,Medial Foot. The wound measures 3.6cm length x 2.6cm width x 0.3cm depth; 7.351cm^2 area and 2.205cm^3 volume. 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 There was a T Contact Cast otal Procedure by [name], MD. Post procedure Diagnosis Wound #9: Same as Pre-Procedure Plan Follow-up Appointments: Return Appointment in 1 week. - MD visit Return Appointment in: - Thursday or Friday for cast change Dressing Change Frequency: Wound #10 Left,Lateral,Plantar Foot: Change Dressing every other day. Wound #9 Right,Medial Foot: Do not change entire dressing for one week. Wound Cleansing: May shower with protection. Primary Wound Dressing: Wound #10 Left,Lateral,Plantar Foot: Calcium Alginate with Silver Wound #9 Right,Medial Foot: Hydrofera Blue Secondary Dressing: Wound #10 Left,Lateral,Plantar Foot: Kerlix/Rolled Gauze Dry Gauze Other: - felt callous pad Wound #9 Right,Medial Foot: Kerlix/Rolled Gauze Zetuvit or Kerramax Drawtex Carboflex Off-Loading: T Contact Cast to Right Lower Extremity otal Open toe surgical shoe to: - add felt to sandal also 1.  T contact cast change. No concerns otal Electronic Signature(s) Signed: 05/23/2019 5:30:42 PM By: Baltazar Najjar MD Entered By: Baltazar Najjar on 05/23/2019 13:32:44 -------------------------------------------------------------------------------- Total Contact Cast Details Patient Name: Date of Service: [name], [name] 05/23/2019 12:45 PM Medical Record Number: [id number] Patient Account Number: [id number] Date of Birth/Sex: Treating RN: 10-22-1955 (64 y.o. [name] Primary Care Provider: MA SO UDI, Judene Companion MA LSA DA T Other Clinician: Referring Provider: Treating Provider/Extender: Baltazar Najjar MA SO UDI, ELHA MA LSA DA  T Weeks in Treatment: 16 T Contact Cast Applied for Wound Assessment: otal Wound #9 Right,Medial Foot Performed By: Physician [name], MD Post Procedure Diagnosis Same as Pre-procedure Electronic Signature(s) Signed: 05/23/2019 5:30:42 PM By: Baltazar Najjar MD Entered By: Baltazar Najjar on 05/23/2019 13:25:31 -------------------------------------------------------------------------------- SuperBill Details Patient Name: Date of Service: [name], [name] 05/23/2019 Medical Record Number: [id number] Patient Account Number: [id number] Date of Birth/Sex: Treating RN: 1955-09-04 (64 y.o. [name] Primary Care Provider: MA SO UDI, [place] MA LSA DA T Other Clinician: Referring Provider: Treating Provider/Extender: Abe People, ELHA MA LSA DA T Weeks in Treatment: 16 Diagnosis Coding ICD-10 Codes Code Description E11.621 Type 2 diabetes mellitus with foot ulcer L97.524 Non-pressure chronic ulcer of other part of left foot with necrosis of bone L97.511 Non-pressure chronic ulcer of other part of right foot limited to breakdown of skin L97.514 Non-pressure chronic ulcer of other part of right foot with necrosis of bone E11.42 Type 2 diabetes mellitus with diabetic polyneuropathy M14.671 Charcot's joint, right ankle and foot M14.672 Charcot's joint, left ankle and foot 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, left ankle and foot Facility Procedures CPT4 Code: [id number] Description: [id number] - APPLY TOTAL CONTACT LEG CAST ICD-10 Diagnosis Description L97.511 Non-pressure chronic ulcer of other part of right foot limited to breakdown of s Modifier: kin Quantity: 1 Physician Procedures : CPT4 Code Description Modifier [id number] - WC PHYS APPLY TOTAL CONTACT CAST ICD-10 Diagnosis Description L97.511 Non-pressure chronic ulcer of other part of right foot limited to breakdown of skin Quantity: 1 Electronic Signature(s) Signed: 05/23/2019 5:30:42 PM By: Baltazar Najjar MD Entered By: Baltazar Najjar on 05/23/2019 13:32:57

## 2019-05-27 ENCOUNTER — Encounter (HOSPITAL_BASED_OUTPATIENT_CLINIC_OR_DEPARTMENT_OTHER): Payer: Medicare Other | Admitting: Internal Medicine

## 2019-05-27 DIAGNOSIS — E11621 Type 2 diabetes mellitus with foot ulcer: Secondary | ICD-10-CM | POA: Diagnosis not present

## 2019-05-28 NOTE — Progress Notes (Signed)
Sarah, [name] ([id number]) Visit Report for 05/27/2019 Arrival Information Details Patient Name: Date of Service: [name], [name] 05/27/2019 1:30 PM Medical Record Number: [id number] Patient Account Number: [id number] Date of Birth/Sex: Treating RN: May 26, 1955 (64 y.o. F) [name] Primary Care Brigida Scotti: MA SO UDI, [place] MA LSA DA T Other Clinician: Referring Fusaye Wachtel: Treating Younis Mathey/Extender: Baltazar Najjar MA SO UDI, ELHA MA LSA DA T Weeks in Treatment: 17 Visit Information History Since Last Visit Added or deleted any medications: No Patient Arrived: Crutches Any new allergies or adverse reactions: No Arrival Time: 13:34 Had a fall or experienced change in No Accompanied By: self activities of daily living that may affect Transfer Assistance: None risk of falls: Patient Identification Verified: Yes Signs or symptoms of abuse/neglect since No Secondary Verification Process Completed: Yes last visito Patient Requires Transmission-Based Precautions: No Hospitalized since last visit: No Patient Has Alerts: No Implantable device outside of the clinic No excluding cellular tissue based products placed in the center since last visit: Has Dressing in Place as Prescribed: Yes Has Footwear/Offloading in Place as Yes Prescribed: Left: Surgical Shoe with Pressure Relief Insole Right: T Contact Cast otal Pain Present Now: No Electronic Signature(s) Signed: 05/28/2019 5:14:55 PM By: [name] Entered By: [name] on 05/27/2019 13:35:02 -------------------------------------------------------------------------------- Encounter Discharge Information Details Patient Name: Date of Service: [facility], [name] 05/27/2019 1:30 PM Medical Record Number: [id number] Patient Account Number: [id number] Date of Birth/Sex: Treating RN: 08-02-1955 (64 y.o. F) [name] Primary Care Ebonee Stober: MA SO UDI, [place] MA LSA DA T Other Clinician: Referring  Aurelio Mccamy: Treating Rich Paprocki/Extender: Baltazar Najjar MA SO UDI, ELHA MA LSA DA T Weeks in Treatment: 35 Encounter Discharge Information Items Post Procedure Vitals Discharge Condition: Stable Temperature (F): 98.5 Ambulatory Status: Crutches Pulse (bpm): 72 Discharge Destination: Home Respiratory Rate (breaths/min): 18 Transportation: Private Auto Blood Pressure (mmHg): 109/55 Accompanied By: self Schedule Follow-up Appointment: Yes Clinical Summary of Care: Patient Declined Electronic Signature(s) Signed: 05/28/2019 5:14:55 PM By: [name] Entered By: [name] on 05/27/2019 14:56:02 -------------------------------------------------------------------------------- Lower Extremity Assessment Details Patient Name: Date of Service: ENO [facility], [name] 05/27/2019 1:30 PM Medical Record Number: [id number] Patient Account Number: [id number] Date of Birth/Sex: Treating RN: 1955/09/23 (64 y.o. F) [name] Primary Care Crista Nuon: MA SO UDI, [place] MA LSA DA T Other Clinician: Referring Usher Hedberg: Treating Kagen Kunath/Extender: Baltazar Najjar MA SO UDI, ELHA MA LSA DA T Weeks in Treatment: 17 Edema Assessment Assessed: [Left: Yes] [Right: Yes] Edema: [Left: Yes] [Right: Yes] Calf Left: Right: Point of Measurement: 43 cm From Medial Instep 36 cm 37 cm Ankle Left: Right: Point of Measurement: 12 cm From Medial Instep 24 cm 26.5 cm Vascular Assessment Pulses: Dorsalis Pedis Palpable: [Left:Yes] [Right:Yes] Electronic Signature(s) Signed: 05/28/2019 5:14:55 PM By: [name] Entered By: [name] on 05/27/2019 13:48:54 -------------------------------------------------------------------------------- Multi Wound Chart Details Patient Name: Date of Service: [name], [name] 05/27/2019 1:30 PM Medical Record Number: [id number] Patient Account Number: [id number] Date of Birth/Sex: Treating RN: [birth year] (64 y.o. [name] Primary Care  Kera Deacon: MA SO UDI, [place] MA LSA DA T Other Clinician: Referring Ameri Cahoon: Treating Merridy Pascoe/Extender: Baltazar Najjar MA SO UDI, ELHA MA LSA DA T Weeks in Treatment: 17 Vital Signs Height(in): 68 Pulse(bpm): 72 Weight(lbs): 265 Blood Pressure(mmHg): 109/55 Body Mass Index(BMI): 40 Temperature(F): 98.5 Respiratory Rate(breaths/min): 18 Photos: [10:No Photos Left, Lateral, Plantar Foot] [9:No Photos Right, Medial Foot] [N/A:N/A N/A] Wound Location: [10:Blister] [9:Blister] [N/A:N/A] Wounding Event: [10:Diabetic Wound/Ulcer of the Lower] [9:Diabetic Wound/Ulcer of the  Lower] [N/A:N/A] Primary Etiology: [10:Extremity Glaucoma, Chronic sinus] [9:Extremity Glaucoma, Chronic sinus] [N/A:N/A] Comorbid History: [10:problems/congestion, Anemia, Sleep Apnea, Hypertension, Type II Diabetes, Rheumatoid Arthritis, Neuropathy 11/18/2018] [9:problems/congestion, Anemia, Sleep Apnea, Hypertension, Type II Diabetes, Rheumatoid Arthritis, Neuropathy  06/18/2018] [N/A:N/A] Date Acquired: [10:17] [9:17] [N/A:N/A] Weeks of Treatment: [10:Open] [9:Open] [N/A:N/A] Wound Status: [10:1.2x1.3x1.2] [9:3.7x2.5x0.5] [N/A:N/A] Measurements L x W x D (cm) [10:1.225] [9:7.265] [N/A:N/A] A (cm) : rea [10:1.47] [9:3.632] [N/A:N/A] Volume (cm) : [10:30.40%] [9:33.90%] [N/A:N/A] % Reduction in A [10:rea: 44.30%] [9:63.30%] [N/A:N/A] % Reduction in Volume: [10:3] Position 1 (o'clock): [10:1.3] Maximum Distance 1 (cm): [10:Yes] [9:No] [N/A:N/A] Tunneling: [10:Grade 2] [9:Grade 2] [N/A:N/A] Classification: [10:Medium] [9:Large] [N/A:N/A] Exudate A mount: [10:Serosanguineous] [9:Serosanguineous] [N/A:N/A] Exudate Type: [10:red, brown] [9:red, brown] [N/A:N/A] Exudate Color: [10:Thickened] [9:Distinct, outline attached] [N/A:N/A] Wound Margin: [10:Large (67-100%)] [9:Large (67-100%)] [N/A:N/A] Granulation A mount: [10:Red, Pink] [9:Red] [N/A:N/A] Granulation Quality: [10:None Present (0%)] [9:None Present (0%)]  [N/A:N/A] Necrotic A mount: [10:Fat Layer (Subcutaneous Tissue)] [9:Fat Layer (Subcutaneous Tissue)] [N/A:N/A] Exposed Structures: [10:Exposed: Yes Muscle: Yes Fascia: No Tendon: No Joint: No Bone: No None] [9:Exposed: Yes Fascia: No Tendon: No Muscle: No Joint: No Bone: No Small (1-33%)] [N/A:N/A] Epithelialization: [10:N/A] [9:Debridement - Excisional] [N/A:N/A] Debridement: Pre-procedure Verification/Time Out N/A [9:14:42] [N/A:N/A] Taken: [10:N/A] [9:Callus, Subcutaneous] [N/A:N/A] Tissue Debrided: [10:N/A] [9:Skin/Subcutaneous Tissue] [N/A:N/A] Level: [10:N/A] [9:9.25] [N/A:N/A] Debridement A (sq cm): [10:rea N/A] [9:Curette] [N/A:N/A] Instrument: [10:N/A] [9:Moderate] [N/A:N/A] Bleeding: [10:N/A] [9:Pressure] [N/A:N/A] Hemostasis A chieved: [10:N/A] [9:0] [N/A:N/A] Procedural Pain: [10:N/A] [9:0] [N/A:N/A] Post Procedural Pain: [10:N/A] [9:Procedure was tolerated well] [N/A:N/A] Debridement Treatment Response: [10:N/A] [9:3.7x2.5x0.5] [N/A:N/A] Post Debridement Measurements L x W x D (cm) [10:N/A] [9:3.632] [N/A:N/A] Post Debridement Volume: (cm) [10:callus noted to periwound.] [9:callus and maceration noted to] [N/A:N/A] Assessment Notes: [10:N/A] [9:periwound. Debridement] [N/A:N/A] Procedures Performed: [9:T Contact Cast otal] Treatment Notes Wound #10 (Left, Lateral, Plantar Foot) 1. Cleanse With Wound Cleanser 2. Periwound Care Skin Prep 3. Primary Dressing Applied Calcium Alginate Ag 4. Secondary Dressing ABD Pad Dry Gauze Roll Gauze 5. Secured With Tape Notes stockinette Wound #9 (Right, Medial Foot) 1. Cleanse With Wound Cleanser Soap and water 3. Primary Dressing Applied Hydrofera Blue 4. Secondary Dressing ABD Pad Dry Gauze Roll Gauze Kerramax/Xtrasorb Other secondary dressing (specify in notes) 7. Footwear/Offloading device applied T Contact Cast otal Notes drawtex. TCC applied by MD. Electronic Signature(s) Signed: 05/28/2019 8:11:21 AM By:  Baltazar Najjar MD Signed: 05/28/2019 5:17:28 PM By: Zandra Abts RN, BSN Entered By: Baltazar Najjar on 05/27/2019 15:19:20 -------------------------------------------------------------------------------- Multi-Disciplinary Care Plan Details Patient Name: Date of Service: [facility], Arsenia J. 05/27/2019 1:30 PM Medical Record Number: [id number] Patient Account Number: [id number] Date of Birth/Sex: Treating RN: 06/04/1955 (64 y.o. [name] Primary Care Louise Rawson: MA SO UDI, Judene Companion MA LSA DA T Other Clinician: Referring Evonte Prestage: Treating Oreoluwa Gilmer/Extender: Baltazar Najjar MA SO UDI, ELHA MA LSA DA T Weeks in Treatment: 17 Active Inactive Wound/Skin Impairment Nursing Diagnoses: Impaired tissue integrity Knowledge deficit related to ulceration/compromised skin integrity Goals: Patient/caregiver will verbalize understanding of skin care regimen Date Initiated: 01/28/2019 Target Resolution Date: 06/14/2019 Goal Status: Active Interventions: Assess patient/caregiver ability to obtain necessary supplies Assess patient/caregiver ability to perform ulcer/skin care regimen upon admission and as needed Assess ulceration(s) every visit Provide education on ulcer and skin care Notes: Electronic Signature(s) Signed: 05/28/2019 5:17:28 PM By: Zandra Abts RN, BSN Entered By: Zandra Abts on 05/27/2019 13:35:04 -------------------------------------------------------------------------------- Pain Assessment Details Patient Name: Date of Service: [name], Daysi J. 05/27/2019 1:30 PM Medical Record Number: [id number] Patient Account Number:  [id number] Date of Birth/Sex: Treating RN: 1955/12/09 (65 y.o. Clearnce Sorrel Primary Care Brodi Nery: MA SO UDI, [place] MA LSA DA T Other Clinician: Referring Williom Cedar: Treating Radford Pease/Extender: Linton Ham MA SO UDI, ELHA MA LSA DA T Weeks in Treatment: 17 Active Problems Location of Pain Severity and Description of Pain Patient  Has Paino No Site Locations Rate the pain. Current Pain Level: 0 Pain Management and Medication Current Pain Management: Medication: No Cold Application: No Rest: No Massage: No Activity: No T.E.N.S.: No Heat Application: No Leg drop or elevation: No Is the Current Pain Management Adequate: Adequate How does your wound impact your activities of daily livingo Sleep: No Bathing: No Appetite: No Relationship With Others: No Bladder Continence: No Emotions: No Bowel Continence: No Work: No Toileting: No Drive: No Dressing: No Hobbies: No Electronic Signature(s) Signed: 05/28/2019 5:14:55 PM By: Kela Millin Entered By: Kela Millin on 05/27/2019 13:48:14 -------------------------------------------------------------------------------- Patient/Caregiver Education Details Patient Name: Date of Service: Addison Lank 5/10/2021andnbsp1:30 PM Medical Record Number: [id number] Patient Account Number: [id number] Date of Birth/Gender: Treating RN: [birth year] (64 y.o. [name] Primary Care Physician: MA SO UDI, Willette Pa MA LSA DA T Other Clinician: Referring Physician: Treating Physician/Extender: Linton Ham MA SO UDI, ELHA MA LSA DA T Weeks in Treatment: 79 Education Assessment Education Provided To: Patient Education Topics Provided Wound/Skin Impairment: Methods: Explain/Verbal Responses: State content correctly [employer]) Signed: 05/28/2019 5:17:28 PM By: Levan Hurst RN, BSN Entered By: Levan Hurst on 05/27/2019 13:35:18 -------------------------------------------------------------------------------- Wound Assessment Details Patient Name: Date of Service: [name], Shaana J. 05/27/2019 1:30 PM Medical Record Number: [id number] Patient Account Number: [id number] Date of Birth/Sex: Treating RN: Sep 01, 1955 (64 y.o. F) Dwiggins, Shannon Primary Care Alysia Scism: MA SO UDI, [place] MA LSA DA T Other Clinician: Referring  Rosalind Guido: Treating Irmgard Rampersaud/Extender: Linton Ham MA SO UDI, ELHA MA LSA DA T Weeks in Treatment: 17 Wound Status Wound Number: 10 Primary Diabetic Wound/Ulcer of the Lower Extremity Etiology: Wound Location: Left, Lateral, Plantar Foot Wound Open Wounding Event: Blister Status: Date Acquired: 11/18/2018 Comorbid Glaucoma, Chronic sinus problems/congestion, Anemia, Sleep Weeks Of Treatment: 17 History: Apnea, Hypertension, Type II Diabetes, Rheumatoid Arthritis, Clustered Wound: No Neuropathy Photos Photo Uploaded By: Mikeal Hawthorne on 05/28/2019 10:03:30 Wound Measurements Length: (cm) 1.2 Width: (cm) 1.3 Depth: (cm) 1.2 Area: (cm) 1.225 Volume: (cm) 1.47 % Reduction in Area: 30.4% % Reduction in Volume: 44.3% Epithelialization: None Tunneling: Yes Position (o'clock): 3 Maximum Distance: (cm) 1.3 Undermining: No Wound Description Classification: Grade 2 Wound Margin: Thickened Exudate Amount: Medium Exudate Type: Serosanguineous Exudate Color: red, brown Foul Odor After Cleansing: No Slough/Fibrino No Wound Bed Granulation Amount: Large (67-100%) Exposed Structure Granulation Quality: Red, Pink Fascia Exposed: No Necrotic Amount: None Present (0%) Fat Layer (Subcutaneous Tissue) Exposed: Yes Tendon Exposed: No Muscle Exposed: Yes Necrosis of Muscle: No Joint Exposed: No Bone Exposed: No Assessment Notes callus noted to periwound. Treatment Notes Wound #10 (Left, Lateral, Plantar Foot) 1. Cleanse With Wound Cleanser 2. Periwound Care Skin Prep 3. Primary Dressing Applied Calcium Alginate Ag 4. Secondary Dressing ABD Pad Dry Gauze Roll Gauze 5. Secured With Medical laboratory scientific officer) Signed: 05/28/2019 5:14:55 PM By: Kela Millin Entered By: Kela Millin on 05/27/2019 13:50:48 -------------------------------------------------------------------------------- Wound Assessment Details Patient Name: Date of  Service: ENO Elige Ko, Cashlynn J. 05/27/2019 1:30 PM Medical Record Number: [id number] Patient Account Number: [id number] Date of Birth/Sex: Treating RN: Jul 08, 1955 (64 y.o. F) Dwiggins, [facility] Primary Care Shavar Gorka: MA SO UDI, ELHA MA LSA DA T Other  Clinician: Referring Leeah Politano: Treating Elexius Minar/Extender: Baltazar Najjar MA SO UDI, ELHA MA LSA DA T Weeks in Treatment: 17 Wound Status Wound Number: 9 Primary Diabetic Wound/Ulcer of the Lower Extremity Etiology: Wound Location: Right, Medial Foot Wound Open Wounding Event: Blister Status: Date Acquired: 06/18/2018 Comorbid Glaucoma, Chronic sinus problems/congestion, Anemia, Sleep Weeks Of Treatment: 17 History: Apnea, Hypertension, Type II Diabetes, Rheumatoid Arthritis, Clustered Wound: No Neuropathy Photos Photo Uploaded By: Benjaman Kindler on 05/28/2019 10:03:30 Wound Measurements Length: (cm) 3.7 Width: (cm) 2.5 Depth: (cm) 0.5 Area: (cm) 7.265 Volume: (cm) 3.632 Wound Description Classification: Grade 2 Wound Margin: Distinct, outline attached Exudate Amount: Large Exudate Type: Serosanguineous Exudate Color: red, brown Foul Odor After Cleansing: Slough/Fibrino % Reduction in Area: 33.9% % Reduction in Volume: 63.3% Epithelialization: Small (1-33%) Tunneling: No Undermining: No No No Wound Bed Granulation Amount: Large (67-100%) Exposed Structure Granulation Quality: Red Fascia Exposed: No Necrotic Amount: None Present (0%) Fat Layer (Subcutaneous Tissue) Exposed: Yes Tendon Exposed: No Muscle Exposed: No Joint Exposed: No Bone Exposed: No Assessment Notes callus and maceration noted to periwound. Treatment Notes Wound #9 (Right, Medial Foot) 1. Cleanse With Wound Cleanser Soap and water 3. Primary Dressing Applied Hydrofera Blue 4. Secondary Dressing ABD Pad Dry Gauze Roll Gauze Kerramax/Xtrasorb Other secondary dressing (specify in notes) 7. Footwear/Offloading device applied T Contact  Cast otal Notes drawtex. TCC applied by MD. Electronic Signature(s) Signed: 05/28/2019 5:14:55 PM By: [name] Entered By: [name] on 05/27/2019 13:50:09 -------------------------------------------------------------------------------- Vitals Details Patient Name: Date of Service: [name], Addley J. 05/27/2019 1:30 PM Medical Record Number: [id number] Patient Account Number: [id number] Date of Birth/Sex: Treating RN: August 05, 1955 (64 y.o. F) Dwiggins, Shannon Primary Care Azuree [name]: MA SO UDI, [place] MA LSA DA T Other Clinician: Referring Darlynn Ricco: Treating Jareth Pardee/Extender: Baltazar Najjar MA SO UDI, ELHA MA LSA DA T Weeks in Treatment: 17 Vital Signs Time Taken: 13:35 Temperature (F): 98.5 Height (in): 68 Pulse (bpm): 72 Weight (lbs): 265 Respiratory Rate (breaths/min): 18 Body Mass Index (BMI): 40.3 Blood Pressure (mmHg): 109/55 Reference Range: 80 - 120 mg / dl Electronic Signature(s) Signed: 05/28/2019 5:14:55 PM By: [name] Entered By: [name] on 05/27/2019 13:48:05

## 2019-05-28 NOTE — Progress Notes (Signed)
Sarah, [name] ([id number]) Visit Report for 05/27/2019 Debridement Details Patient Name: Date of Service: Sarah, [name] 05/27/2019 1:30 PM Medical Record Number: [id number] Patient Account Number: [id number] Date of Birth/Sex: Treating RN: Apr 25, 1955 (64 y.o. [name] Primary Care Provider: MA SO UDI, [place] MA LSA DA T Other Clinician: Referring Provider: Treating Provider/Extender: Baltazar Najjar MA SO UDI, ELHA MA LSA DA T Weeks in Treatment: 17 Debridement Performed for Assessment: Wound #9 Right,Medial Foot Performed By: Physician [name], MD Debridement Type: Debridement Severity of Tissue Pre Debridement: Fat layer exposed Level of Consciousness (Pre-procedure): Awake and Alert Pre-procedure Verification/Time Out Yes - 14:42 Taken: Start Time: 14:42 T Area Debrided (L x W): otal 3.7 (cm) x 2.5 (cm) = 9.25 (cm) Tissue and other material debrided: Viable, Non-Viable, Callus, Subcutaneous Level: Skin/Subcutaneous Tissue Debridement Description: Excisional Instrument: Curette Bleeding: Moderate Hemostasis Achieved: Pressure End Time: 14:42 Procedural Pain: 0 Post Procedural Pain: 0 Response to Treatment: Procedure was tolerated well Level of Consciousness (Post- Awake and Alert procedure): Post Debridement Measurements of Total Wound Length: (cm) 3.7 Width: (cm) 2.5 Depth: (cm) 0.5 Volume: (cm) 3.632 Character of Wound/Ulcer Post Debridement: Improved Severity of Tissue Post Debridement: Fat layer exposed Post Procedure Diagnosis Same as Pre-procedure Electronic Signature(s) Signed: 05/28/2019 8:11:21 AM By: Baltazar Najjar MD Signed: 05/28/2019 5:17:28 PM By: Zandra Abts RN, BSN Entered By: Baltazar Najjar on 05/27/2019 15:20:35 -------------------------------------------------------------------------------- HPI Details Patient Name: Date of Service: [name], [name] 05/27/2019 1:30 PM Medical Record Number: [id number] Patient  Account Number: [id number] Date of Birth/Sex: Treating RN: January 24, 1955 (64 y.o. [name] Primary Care Provider: MA SO UDI, [place] MA LSA DA T Other Clinician: Referring Provider: Treating Provider/Extender: Baltazar Najjar MA SO UDI, ELHA MA LSA DA T Weeks in Treatment: 17 History of Present Illness HPI Description: 64 yrs old bf here for follow up recurrent left charcot foot ulcer. She has DM. She is not a smoker. She states a blister developed 3 months ago at site of previous breakdown from 1 year ago. It was debrided at the podiatrist's office. She went to the ER and then sent here. She denies pain. Xray was negative for osteo. Culture from this clinic negative. 09/08/14 [address] [name] to [name] for Charcot foot. Seen by him and MRI scheduled 09/19/14. Prior silver collagen but this was not ordered last visit, patient has been rinsing with saline alone. Re institute silver collagen every other day. F/u 2 weeks with [name] as Labor Day holiday. Supplies ordered. 09/25/14; this is a patient with a Charcot foot on the left. she has a wound over the plantar aspect of her left calcaneus. She has been seen by [name] of orthopedic surgery and has had an MRI. She is apparently going within the next week or 2 for corrective surgery which will involve an external fixator. I don't have any of the details of this. 10/06/14; The patient is a 64 yrs old bf with a left Charcot foot and ulcer on the plantar. 12/01/14 Returns post surgery with [name]. Has follow up visit later this week with him, currently in facility and NWB over this extremity. States no drainage from foot and no current wound care. On exam callus and scab in place, suspect healed. Has external fixator in place. Will defer to [name] for management, ok to place moisturizing cream over scabs and callus and she will call if she requires follow up here. READMISSION 01/28/2019 Patient was in the  clinic in 2016 for a  wound on the plantar aspect of her left calcaneus. Predominantly cared for by [name] she was referred to [name] and had corrective surgery for the position of her left ankle I believe. She had previously been here in 2015 and then prior to that in 2011 2012 that I do not have these records. More recently she has developed fairly extensive wounds on the right medial foot, left lateral foot and a small area on the right medial great toe. Right medial foot wound has been there for 6 to 7 months, left foot wound for 2 to 3 months and the right great toe only over the last few weeks. She has been followed by [name] at Dr. Laverta [place] office it sounds as though she has been undergoing callus paring and silver nitrate. The patient has been washing these off with soap and water and plan applying dry dressing Past medical history, type 2 diabetes well controlled with a recent hemoglobin A1c of 7 on 11/16, type 2 diabetes with peripheral neuropathy, previous left Charcot joint surgery bilateral Charcot joints currently, stage III chronic renal failure, hypertension, obstructive sleep apnea, history of MRSA and gastroesophageal reflux. 1/18; this is a patient with bilateral Charcot feet. Plain x-rays that I did of both of these wounds that are either at bone or precariously close to bone were done. On the right foot this showed possible lytic destruction involving the anterior aspect of the talus which may represent a wound osteomyelitis. An MRI was recommended. On the left there was no definite lytic destruction although the wound is deep undermining's and I think probably requires an MRI on this basis in any case 1/25; patient was supposed to have her MRI last Friday of her bilateral feet however they would not do it because there was silver alginate in her dressings, will replace with calcium alginate today and will see if we can get her done today 2/1; patient did not get her MRIs done last  week because of issues with the MRI department receiving orders to do bilateral feet. She has 2 deep wounds in the setting of Charcot feet deformities bilaterally. Both of them at one point had exposed bone although I had trouble demonstrating that today. I am not sure that we can offload these very aggressively as I watched her leave the clinic last week her gait is very narrow and unsteady. 2/15; the MRI of her right foot did not show osteomyelitis potentially osteonecrosis of the talus. 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 The wounds are a large wound on the right medial foot and on the left a substantial wound on the left lateral plantar calcaneus. We have been using silver alginate. Ultimately I think we need to consider a total contact cast on the right while we work through the possibility of osteomyelitis in the left. I watched her walk out with her crutches I have some trepidation about the ability to walk with the cast on her right foot. Nevertheless with her Charcot deformities I do not think there is a way to heal these short of offloading them aggressively. 2/22; the culture of the bone in the left foot that I did last week showed a few Citrobacter Koseri as well as some streptococcal species. In my attempted bone for pathology did not actually yield a lot of bone the pathologist did not identify any osteomyelitis. Nevertheless based on the MRI, bone for culture  and the probing wound into the bone itself I think this woman has osteomyelitis in the left foot and we will refer her to infectious disease. Is promised today and aggressive debridement of the right foot and a total contact cast which surprisingly she seemed to tolerate quite well 03/13/2019 upon evaluation today patient appears to be doing well with her total contact cast she is here for the first cast change. She had no areas of rubbing or any other complications at this  point. Overall things are doing quite well. 2/26; the patient was kindly seen by Dr. Megan Salon of infectious disease on 2/25. She is now on IV vancomycin PICC line placement in 2 days and oral Flagyl. This was in response to her MRI showing osteomyelitis of the left calcaneus concerning for osteomyelitis. We have been putting her right leg in a total contact cast. Our nurses report drainage. She is tolerating the total contact cast well. 3/4; the patient is started her IV antibiotics and is taking IV vancomycin and oral Flagyl. She appears to be tolerating these both well. This is for osteomyelitis involving the left calcaneus. The area on the right in the setting of bilateral Charcot deformities did not have any bone infection on MRI. We put a total contact cast on her with silver alginate as the primary dressing and she returns today in follow-up. Per our intake nurse there was too much drainage to leave this on all week therefore we will bring her back for an early change 3/8; follow-up total contact cast she is still having a lot of drainage probably too much drainage from the right foot wound will week with the same cast. She will be back on Thursday. The area on the left looks somewhat better 3/11; patient here for a total contact cast change. 3/15; patient came in for wound care evaluation. She is still on IV vancomycin and oral Flagyl for osteomyelitis in the left foot. The area on the right foot we are putting in a total contact cast. 3/18 for total contact cast change on the right foot 3/22; the patient was here for wound review today. The area on the right foot is slightly smaller in terms of surface area. However the surface of the wound is very gritty and hyper granulated. More problematically the area on the plantar left foot has increased depth indeed in the center of this it probes to bone. We have been using Hydrofera Blue in both areas. She is on antibiotics as prescribed by  infectious disease IV vancomycin and oral Flagyl 3/25; the patient is in for total contact cast change. Our intake nurse was concerned about the amount of drainage which soaked right to the multiple layers we are putting on this. Wondered about options. The wound bed actually looks as healthy as this is looked although there may not be a lot of change in dimensions things are looking a lot better. If we are going to heal this woman's wound which is in the middle of her right Charcot foot this is going to need to be offloaded. There are not many alternatives 3/29; still not a lot of improvement although the surface of the right wound looks better. We have been using Hydrofera Blue 04/17/2019 upon evaluation today patient appears to be doing well with a total contact cast. With actually having to change this twice a week. She saw Dr. Dellia Nims for wound care earlier in the week this is for the cast change today. That is due  to the fact that she has significant drainage. Overall the wound is been looking excellent however. 4/5; we continue to put a total contact cast on this patient with Hydrofera Blue. Still a lot of drainage which precludes a simple weekly change or changing this twice a week. 4/8; total contact cast changed today. Apparently the wound looks better per our intake nurse 4/12; patient here for wound care evaluation. Wounds on the right foot have not changed in dimension none about a month left foot looks similar. Apparently her antibiotics that we are giving her for osteomyelitis in the left foot complete on Wednesday. We have been using Hydrofera Blue on the right foot under a total contact cast. Nurses report greenish drainage although I think this may be discoloration from the [facility] 4/15; back for cast change wounds look quite good although still moderate amount of drainage. T contact cast reapplied otal 4/19;. Wound evaluation. Wound on the right foot is smaller surface looks  healthy. There is still the divot in the middle part of this wound but I could not feel any bone. Area on the left foot also looks better She has completed her antibiotics but still has the PICC line in. 4/23; patient comes back for a total contact cast change this was done in the standard fashion no issues 4/26; wound measures slightly larger on the right foot which is disappointing. She sees Dr. Orvan Falconer with regards to her antibiotics on Wednesday. I believe she is on vancomycin IV and oral Flagyl. 4/29; in for her routine total contact cast change. She saw Dr. Orvan Falconer this morning. He is asking about antibiotic continuation I will be in touch with him. I did not look at her wound today 5/3; patient saw Dr. Orvan Falconer on 4/28. I did send him a note asking about the continuation of perhaps an oral regimen. I have not heard back. She still has an elevated sedimentation rate at 81. Her wounds are smaller. 5/6; in for her total contact cast change. We did not look at the wound today. She has seen Dr. Orvan Falconer and is now on oral Keflex and Flagyl. She seems to be tolerating these well 5/13; she had her PICC line removed. The wound on the right foot after some initial improvement has not made any improvement even with the total contact cast. Still having a lot of drainage. Likewise the area on the left foot really is not any better either Electronic Signature(s) Signed: 05/28/2019 8:11:21 AM By: Baltazar Najjar MD Entered By: Baltazar Najjar on 05/27/2019 15:21:37 -------------------------------------------------------------------------------- Physical Exam Details Patient Name: Date of Service: [name], [name] 05/27/2019 1:30 PM Medical Record Number: [id number] Patient Account Number: [id number] Date of Birth/Sex: Treating RN: 10/28/1955 (64 y.o. [name] Primary Care Provider: MA SO UDI, [place] MA LSA DA T Other Clinician: Referring Provider: Treating Provider/Extender: Baltazar Najjar MA SO UDI, ELHA MA LSA DA T Weeks in Treatment: 17 Notes Wound exam; the wound on the right in the middle of the Charcot deformity medially is not any better. Still callused rolled edges nonviable surface. Aggressive debridement with a #5 curette. Hemostasis with silver nitrate and a pressure dressing Similarly the area on the left foot is really about the same as well still deep and punched out Electronic Signature(s) Signed: 05/28/2019 8:11:21 AM By: Baltazar Najjar MD Entered By: Baltazar Najjar on 05/27/2019 15:22:39 -------------------------------------------------------------------------------- Physician Orders Details Patient Name: Date of Service: [name], [name] 05/27/2019 1:30 PM Medical Record Number: [id number] Patient  Account Number: [id number] Date of Birth/Sex: Treating RN: 06-14-1955 (64 y.o. [name] Primary Care Provider: MA SO UDI, [place] MA LSA DA T Other Clinician: Referring Provider: Treating Provider/Extender: Baltazar Najjar MA SO UDI, ELHA MA LSA DA T Weeks in Treatment: 17 Verbal / Phone Orders: No Diagnosis Coding ICD-10 Coding Code Description E11.621 Type 2 diabetes mellitus with foot ulcer L97.524 Non-pressure chronic ulcer of other part of left foot with necrosis of bone L97.511 Non-pressure chronic ulcer of other part of right foot limited to breakdown of skin L97.514 Non-pressure chronic ulcer of other part of right foot with necrosis of bone E11.42 Type 2 diabetes mellitus with diabetic polyneuropathy M14.671 Charcot's joint, right ankle and foot M14.672 Charcot's joint, left ankle and foot 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 - MD visit Return A ppointment in: - Thursday or Friday for cast change Return A Dressing Change Frequency Wound #10 Left,Lateral,Plantar Foot Change Dressing every other day. Wound #9 Right,Medial Foot Do not change entire dressing for  one week. Wound Cleansing May shower with protection. Primary Wound Dressing Wound #10 Left,Lateral,Plantar Foot Calcium Alginate with Silver Wound #9 Right,Medial Foot Hydrofera Blue Secondary Dressing Wound #10 Left,Lateral,Plantar Foot Kerlix/Rolled Gauze Dry Gauze Other: - felt callous pad Wound #9 Right,Medial Foot Kerlix/Rolled Gauze Zetuvit or Kerramax Drawtex Carboflex Off-Loading Total Contact Cast to Right Lower Extremity Open toe surgical shoe to: - add felt to sandal also Electronic Signature(s) Signed: 05/28/2019 8:11:21 AM By: Baltazar Najjar MD Signed: 05/28/2019 5:17:28 PM By: Zandra Abts RN, BSN Entered By: Zandra Abts on 05/27/2019 13:34:47 -------------------------------------------------------------------------------- Problem List Details Patient Name: Date of Service: [name], [name] 05/27/2019 1:30 PM Medical Record Number: [id number] Patient Account Number: [id number] Date of Birth/Sex: Treating RN: April 15, 1955 (64 y.o. [name] Primary Care Provider: MA SO UDI, [place] MA LSA DA T Other Clinician: Referring Provider: Treating Provider/Extender: Abe People, ELHA MA LSA DA T Weeks in Treatment: 17 Active Problems ICD-10 Encounter Code Description Active Date MDM Diagnosis E11.621 Type 2 diabetes mellitus with foot ulcer 01/28/2019 No Yes L97.524 Non-pressure chronic ulcer of other part of left foot with necrosis of bone 01/28/2019 No Yes L97.511 Non-pressure chronic ulcer of other part of right foot limited to breakdown of 01/28/2019 No Yes skin L97.514 Non-pressure chronic ulcer of other part of right foot with necrosis of bone 01/28/2019 No Yes E11.42 Type 2 diabetes mellitus with diabetic polyneuropathy 01/28/2019 No Yes M14.671 Charcot's joint, right ankle and foot 01/28/2019 No Yes M14.672 Charcot's joint, left ankle and foot 01/28/2019 No Yes 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 05/27/2019 1:30 PM Medical Record Number: [id number] Patient Account Number: [id number] Date of Birth/Sex: Treating RN: 1955/09/17 (64 y.o. [name] Primary Care Provider: MA SO UDI, [place] MA LSA DA T Other Clinician: Referring Provider: Treating Provider/Extender: Baltazar Najjar MA SO UDI, [place] MA LSA DA T Weeks in Treatment: 17 Subjective History of Present Illness (HPI) 64 yrs old bf here for follow up recurrent left charcot foot ulcer. She has DM. She is not a smoker. She states a blister developed 3 months ago at site of previous breakdown from 1 year ago. It was debrided at the podiatrist's office. She went  to the ER and then sent here. She denies pain. Xray was negative for osteo. Culture from this clinic negative. 09/08/14 Referred by [name] to [name] for Charcot foot. Seen by him and MRI scheduled 09/19/14. Prior silver collagen but this was not ordered last visit, patient has been rinsing with saline alone. Re institute silver collagen every other day. F/u 2 weeks with [name] as Labor Day holiday. Supplies ordered. 09/25/14; this is a patient with a Charcot foot on the left. she has a wound over the plantar aspect of her left calcaneus. She has been seen by [name] of orthopedic surgery and has had an MRI. She is apparently going within the next week or 2 for corrective surgery which will involve an external fixator. I don't have any of the details of this. 10/06/14; The patient is a 64 yrs old bf with a left Charcot foot and ulcer on the plantar. 12/01/14 Returns post surgery with [name]. Has follow up visit later this week with him,  currently in facility and NWB over this extremity. States no drainage from foot and no current wound care. On exam callus and scab in place, suspect healed. Has external fixator in place. Will defer to [name] for management, ok to place moisturizing cream over scabs and callus and she will call if she requires follow up here. READMISSION 01/28/2019 Patient was in the clinic in 2016 for a wound on the plantar aspect of her left calcaneus. Predominantly cared for by [name] she was referred to [name] and had corrective surgery for the position of her left ankle I believe. She had previously been here in 2015 and then prior to that in 2011 2012 that I do not have these records. More recently she has developed fairly extensive wounds on the right medial foot, left lateral foot and a small area on the right medial great toe. Right medial foot wound has been there for 6 to 7 months, left foot wound for 2 to 3 months and the right great toe only over the last few weeks. She has been followed by [name] at Dr. Laverta [place] office it sounds as though she has been undergoing callus paring and silver nitrate. The patient has been washing these off with soap and water and plan applying dry dressing Past medical history, type 2 diabetes well controlled with a recent hemoglobin A1c of 7 on 11/16, type 2 diabetes with peripheral neuropathy, previous left Charcot joint surgery bilateral Charcot joints currently, stage III chronic renal failure, hypertension, obstructive sleep apnea, history of MRSA and gastroesophageal reflux. 1/18; this is a patient with bilateral Charcot feet. Plain x-rays that I did of both of these wounds that are either at bone or precariously close to bone were done. On the right foot this showed possible lytic destruction involving the anterior aspect of the talus which may represent a wound osteomyelitis. An MRI was recommended. On the left there was no definite lytic  destruction although the wound is deep undermining's and I think probably requires an MRI on this basis in any case 1/25; patient was supposed to have her MRI last Friday of her bilateral feet however they would not do it because there was silver alginate in her dressings, will replace with calcium alginate today and will see if we can get her done today 2/1; patient did not get her MRIs done last week because of issues with the MRI department receiving orders to do bilateral feet. She  has 2 deep wounds in the setting of Charcot feet deformities bilaterally. Both of them at one point had exposed bone although I had trouble demonstrating that today. I am not sure that we can offload these very aggressively as I watched her leave the clinic last week her gait is very narrow and unsteady. 2/15; the MRI of her right foot did not show osteomyelitis potentially osteonecrosis of the talus. 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 The wounds are a large wound on the right medial foot and on the left a substantial wound on the left lateral plantar calcaneus. We have been using silver alginate. Ultimately I think we need to consider a total contact cast on the right while we work through the possibility of osteomyelitis in the left. I watched her walk out with her crutches I have some trepidation about the ability to walk with the cast on her right foot. Nevertheless with her Charcot deformities I do not think there is a way to heal these short of offloading them aggressively. 2/22; the culture of the bone in the left foot that I did last week showed a few Citrobacter Koseri as well as some streptococcal species. In my attempted bone for pathology did not actually yield a lot of bone the pathologist did not identify any osteomyelitis. Nevertheless based on the MRI, bone for culture and the probing wound into the bone itself I think this woman has osteomyelitis  in the left foot and we will refer her to infectious disease. Is promised today and aggressive debridement of the right foot and a total contact cast which surprisingly she seemed to tolerate quite well 03/13/2019 upon evaluation today patient appears to be doing well with her total contact cast she is here for the first cast change. She had no areas of rubbing or any other complications at this point. Overall things are doing quite well. 2/26; the patient was kindly seen by Dr. Orvan Falconer of infectious disease on 2/25. She is now on IV vancomycin PICC line placement in 2 days and oral Flagyl. This was in response to her MRI showing osteomyelitis of the left calcaneus concerning for osteomyelitis. We have been putting her right leg in a total contact cast. Our nurses report drainage. She is tolerating the total contact cast well. 3/4; the patient is started her IV antibiotics and is taking IV vancomycin and oral Flagyl. She appears to be tolerating these both well. This is for osteomyelitis involving the left calcaneus. The area on the right in the setting of bilateral Charcot deformities did not have any bone infection on MRI. We put a total contact cast on her with silver alginate as the primary dressing and she returns today in follow-up. Per our intake nurse there was too much drainage to leave this on all week therefore we will bring her back for an early change 3/8; follow-up total contact cast she is still having a lot of drainage probably too much drainage from the right foot wound will week with the same cast. She will be back on Thursday. The area on the left looks somewhat better 3/11; patient here for a total contact cast change. 3/15; patient came in for wound care evaluation. She is still on IV vancomycin and oral Flagyl for osteomyelitis in the left foot. The area on the right foot we are putting in a total contact cast. 3/18 for total contact cast change on the right foot 3/22; the  patient was here for wound review today. The area on the right foot is slightly smaller in terms of surface area. However the surface of the wound is very gritty and hyper granulated. More problematically the area on the plantar left foot has increased depth indeed in the center of this it probes to bone. We have been using Hydrofera Blue in both areas. She is on antibiotics as prescribed by infectious disease IV vancomycin and oral Flagyl 3/25; the patient is in for total contact cast change. Our intake nurse was concerned about the amount of drainage which soaked right to the multiple layers we are putting on this. Wondered about options. The wound bed actually looks as healthy as this is looked although there may not be a lot of change in dimensions things are looking a lot better. If we are going to heal this woman's wound which is in the middle of her right Charcot foot this is going to need to be offloaded. There are not many alternatives 3/29; still not a lot of improvement although the surface of the right wound looks better. We have been using Hydrofera Blue 04/17/2019 upon evaluation today patient appears to be doing well with a total contact cast. With actually having to change this twice a week. She saw [name] for wound care earlier in the week this is for the cast change today. That is due to the fact that she has significant drainage. Overall the wound is been looking excellent however. 4/5; we continue to put a total contact cast on this patient with Hydrofera Blue. Still a lot of drainage which precludes a simple weekly change or changing this twice a week. 4/8; total contact cast changed today. Apparently the wound looks better per our intake nurse 4/12; patient here for wound care evaluation. Wounds on the right foot have not changed in dimension none about a month left foot looks similar. Apparently her antibiotics that we are giving her for osteomyelitis in the left foot  complete on Wednesday. We have been using Hydrofera Blue on the right foot under a total contact cast. Nurses report greenish drainage although I think this may be discoloration from the [facility] 4/15; back for cast change wounds look quite good although still moderate amount of drainage. T contact cast reapplied otal 4/19;. Wound evaluation. Wound on the right foot is smaller surface looks healthy. There is still the divot in the middle part of this wound but I could not feel any bone. Area on the left foot also looks better She has completed her antibiotics but still has the PICC line in. 4/23; patient comes back for a total contact cast change this was done in the standard fashion no issues 4/26; wound measures slightly larger on the right foot which is disappointing. She sees Dr. Orvan Falconer with regards to her antibiotics on Wednesday. I believe she is on vancomycin IV and oral Flagyl. 4/29; in for her routine total contact cast change. She saw Dr. Orvan Falconer this morning. He is asking about antibiotic continuation I will be in touch with him. I did not look at her wound today 5/3; patient saw Dr. Orvan Falconer on 4/28. I did send him a note asking about the continuation of perhaps an oral regimen. I have not heard back. She still has an elevated sedimentation rate at 81. Her wounds are smaller. 5/6; in for her total contact cast change. We did not look at the wound today. She has seen Dr. Orvan Falconer and is now  on oral Keflex and Flagyl. She seems to be tolerating these well 5/13; she had her PICC line removed. The wound on the right foot after some initial improvement has not made any improvement even with the total contact cast. Still having a lot of drainage. Likewise the area on the left foot really is not any better either Objective Constitutional Vitals Time Taken: 1:35 PM, Height: 68 in, Weight: 265 lbs, BMI: 40.3, Temperature: 98.5 F, Pulse: 72 bpm, Respiratory Rate: 18 breaths/min,  Blood Pressure: 109/55 mmHg. Integumentary (Hair, Skin) Wound #10 status is Open. Original cause of wound was Blister. The wound is located on the Left,Lateral,Plantar Foot. The wound measures 1.2cm length x 1.3cm width x 1.2cm depth; 1.225cm^2 area and 1.47cm^3 volume. There is muscle and Fat Layer (Subcutaneous Tissue) Exposed exposed. There is no undermining noted, however, there is tunneling at 3:00 with a maximum distance of 1.3cm. There is a medium amount of serosanguineous drainage noted. The wound margin is thickened. There is large (67-100%) red, pink granulation within the wound bed. There is no necrotic tissue within the wound bed. General Notes: callus noted to periwound. Wound #9 status is Open. Original cause of wound was Blister. The wound is located on the Right,Medial Foot. The wound measures 3.7cm length x 2.5cm width x 0.5cm depth; 7.265cm^2 area and 3.632cm^3 volume. There is Fat Layer (Subcutaneous Tissue) Exposed exposed. There is no tunneling or undermining noted. There is a large amount of serosanguineous drainage noted. The wound margin is distinct with the outline attached to the wound base. There is large (67- 100%) red granulation within the wound bed. There is no necrotic tissue within the wound bed. General Notes: callus and maceration noted to periwound. 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 There was a Excisional Skin/Subcutaneous Tissue Debridement with a total area of 9.25 sq cm performed by [name], MD. With the following instrument(s): Curette to remove Viable and Non-Viable tissue/material. Material removed includes Callus and Subcutaneous Tissue and. No specimens were taken. A time out was conducted at 14:42, prior to the start of the procedure. A Moderate amount of bleeding was controlled with Pressure. The procedure was tolerated well with a pain level of 0 throughout and a pain level of 0 following the procedure. Post Debridement Measurements: 3.7cm length x 2.5cm width x 0.5cm depth; 3.632cm^3 volume. Character of Wound/Ulcer Post Debridement is improved. Severity of Tissue Post Debridement is: Fat layer exposed. Post procedure Diagnosis Wound #9: Same as Pre-Procedure Pre-procedure diagnosis of Wound #9 is a Diabetic Wound/Ulcer of the Lower Extremity located on the Right,Medial Foot . There was a T Contact Cast otal Procedure by [name], MD. Post procedure Diagnosis Wound #9: Same as Pre-Procedure Plan Follow-up Appointments: Return Appointment in 1 week. - MD visit Return Appointment in: - Thursday or Friday for cast change Dressing Change Frequency: Wound #10 Left,Lateral,Plantar Foot: Change Dressing every other day.  Wound #9 Right,Medial Foot: Do not change entire dressing for one week. Wound Cleansing: May shower with protection. Primary Wound Dressing: Wound #10 Left,Lateral,Plantar Foot: Calcium Alginate with Silver Wound #9 Right,Medial Foot: Hydrofera Blue Secondary Dressing: Wound #10 Left,Lateral,Plantar Foot: Kerlix/Rolled Gauze Dry Gauze Other: - felt callous pad Wound #9 Right,Medial Foot: Kerlix/Rolled Gauze Zetuvit or Kerramax Drawtex Carboflex Off-Loading: T Contact Cast to Right Lower Extremity otal Open toe surgical shoe to: - add  felt to sandal also 1. We continued with silver alginate to the left plantar foot 2. Hydrofera Blue to the right foot under the cast 3. After an aggressive debridement today if I do not see any improvement next week I am going to change the cast of the left foot. It is possible that she just needs this to be changed more often then we can do in a cast 4. She is finished treatment of the osteomyelitis at least in terms of IV antibiotics although she is now on oral antibiotic Electronic Signature(s) Signed: 05/28/2019 8:11:21 AM By: Baltazar Najjar MD Entered By: Baltazar Najjar on 05/27/2019 15:23:42 -------------------------------------------------------------------------------- Total Contact Cast Details Patient Name: Date of Service: [facility], [name] 05/27/2019 1:30 PM Medical Record Number: [id number] Patient Account Number: [id number] Date of Birth/Sex: Treating RN: 1955-07-18 (64 y.o. [name] Primary Care Provider: MA SO UDI, Judene Companion MA LSA DA T Other Clinician: Referring Provider: Treating Provider/Extender: Baltazar Najjar MA SO UDI, ELHA MA LSA DA T Weeks in Treatment: 17 T Contact Cast Applied for Wound Assessment: otal Wound #9 Right,Medial Foot Performed By: Physician [name], MD Post Procedure Diagnosis Same as Pre-procedure Electronic Signature(s) Signed: 05/28/2019 8:11:21 AM By: Baltazar Najjar MD Entered By: Baltazar Najjar on 05/27/2019 15:20:49 -------------------------------------------------------------------------------- SuperBill Details Patient Name: Date of Service: Navos, [place] J. 05/27/2019 Medical Record Number: [id number] Patient Account Number: [id number] Date of Birth/Sex: Treating RN: [birth year] (64 y.o. [name] Primary Care Provider: MA SO UDI, [place] MA LSA DA T Other Clinician: Referring Provider: Treating Provider/Extender: Abe People, ELHA MA LSA DA T Weeks in Treatment: 17 Diagnosis  Coding ICD-10 Codes Code Description E11.621 Type 2 diabetes mellitus with foot ulcer L97.524 Non-pressure chronic ulcer of other part of left foot with necrosis of bone L97.511 Non-pressure chronic ulcer of other part of right foot limited to breakdown of skin L97.514 Non-pressure chronic ulcer of other part of right foot with necrosis of bone E11.42 Type 2 diabetes mellitus with diabetic polyneuropathy M14.671 Charcot's joint, right ankle and foot M14.672 Charcot's joint, left ankle and foot 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, left ankle and foot Facility Procedures CPT4 Code: [id number] Description: 11042 - DEB SUBQ TISSUE 20 SQ CM/< ICD-10 Diagnosis Description L97.511 Non-pressure chronic ulcer of other part of right foot limited to breakdown of Modifier: skin Quantity: 1 Physician Procedures : CPT4 Code Description Modifier [id number] 11042 - WC PHYS SUBQ TISS 20 SQ CM ICD-10 Diagnosis Description L97.511 Non-pressure chronic ulcer of other part of right foot limited to breakdown of skin Quantity: 1 Electronic Signature(s) Signed: 05/28/2019 8:11:21 AM By: Baltazar Najjar MD Entered By: Baltazar Najjar on 05/27/2019 15:24:13

## 2019-05-30 ENCOUNTER — Encounter (HOSPITAL_BASED_OUTPATIENT_CLINIC_OR_DEPARTMENT_OTHER): Payer: Medicare Other | Admitting: Internal Medicine

## 2019-05-31 ENCOUNTER — Encounter (HOSPITAL_BASED_OUTPATIENT_CLINIC_OR_DEPARTMENT_OTHER): Payer: Medicare Other | Admitting: Internal Medicine

## 2019-05-31 ENCOUNTER — Other Ambulatory Visit: Payer: Self-pay

## 2019-05-31 DIAGNOSIS — E11621 Type 2 diabetes mellitus with foot ulcer: Secondary | ICD-10-CM | POA: Diagnosis not present

## 2019-06-01 NOTE — Progress Notes (Signed)
[name], [name] ([id number]) Visit Report for 05/31/2019 HPI Details Patient Name: Date of Service: [name], [name] 05/31/2019 3:15 PM Medical Record Number: [id number] Patient Account Number: [id number] Date of Birth/Sex: Treating RN: 1955/11/03 (64 y.o. F) Primary Care Provider: MA SO UDI, Judene Companion MA LSA DA T Other Clinician: Referring Provider: Treating Provider/Extender: Baltazar Najjar MA SO UDI, ELHA MA LSA DA T Weeks in Treatment: 17 History of Present Illness HPI Description: 64 yrs old bf here for follow up recurrent left charcot foot ulcer. She has DM. She is not a smoker. She states a blister developed 3 months ago at site of previous breakdown from 1 year ago. It was debrided at the podiatrist's office. She went to the ER and then sent here. She denies pain. Xray was negative for osteo. Culture from this clinic negative. 09/08/14 [name] to [name] for Charcot foot. Seen by him and MRI scheduled 09/19/14. Prior silver collagen but this was not ordered last visit, patient has been rinsing with saline alone. Re institute silver collagen every other day. F/u 2 weeks with [name] as Labor Day holiday. Supplies ordered. 09/25/14; this is a patient with a Charcot foot on the left. she has a wound over the plantar aspect of her left calcaneus. She has been seen by [name] of orthopedic surgery and has had an MRI. She is apparently going within the next week or 2 for corrective surgery which will involve an external fixator. I don't have any of the details of this. 10/06/14; The patient is a 64 yrs old bf with a left Charcot foot and ulcer on the plantar. 12/01/14 Returns post surgery with [name]. Has follow up visit later this week with him, currently in facility and NWB over this extremity. States no drainage from foot and no current wound care. On exam callus and scab in place, suspect healed. Has external fixator in place. Will defer to [name] for  management, ok to place moisturizing cream over scabs and callus and she will call if she requires follow up here. READMISSION 01/28/2019 Patient was in the clinic in 2016 for a wound on the plantar aspect of her left calcaneus. Predominantly cared for by [name] she was referred to [name] and had corrective surgery for the position of her left ankle I believe. She had previously been here in 2015 and then prior to that in 2011 2012 that I do not have these records. More recently she has developed fairly extensive wounds on the right medial foot, left lateral foot and a small area on the right medial great toe. Right medial foot wound has been there for 6 to 7 months, left foot wound for 2 to 3 months and the right great toe only over the last few weeks. She has been followed by [name] at Dr. Laverta [place] office it sounds as though she has been undergoing callus paring and silver nitrate. The patient has been washing these off with soap and water and plan applying dry dressing Past medical history, type 2 diabetes well controlled with a recent hemoglobin A1c of 7 on 11/16, type 2 diabetes with peripheral neuropathy, previous left Charcot joint surgery bilateral Charcot joints currently, stage III chronic renal failure, hypertension, obstructive sleep apnea, history of MRSA and gastroesophageal reflux. 1/18; this is a patient with bilateral Charcot feet. Plain x-rays that I did of both of these wounds that are either at bone or precariously close to bone were done.  On the right foot this showed possible lytic destruction involving the anterior aspect of the talus which may represent a wound osteomyelitis. An MRI was recommended. On the left there was no definite lytic destruction although the wound is deep undermining's and I think probably requires an MRI on this basis in any case 1/25; patient was supposed to have her MRI last Friday of her bilateral feet however they would not do it  because there was silver alginate in her dressings, will replace with calcium alginate today and will see if we can get her done today 2/1; patient did not get her MRIs done last week because of issues with the MRI department receiving orders to do bilateral feet. She has 2 deep wounds in the setting of Charcot feet deformities bilaterally. Both of them at one point had exposed bone although I had trouble demonstrating that today. I am not sure that we can offload these very aggressively as I watched her leave the clinic last week her gait is very narrow and unsteady. 2/15; the MRI of her right foot did not show osteomyelitis potentially osteonecrosis of the talus. 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 The wounds are a large wound on the right medial foot and on the left a substantial wound on the left lateral plantar calcaneus. We have been using silver alginate. Ultimately I think we need to consider a total contact cast on the right while we work through the possibility of osteomyelitis in the left. I watched her walk out with her crutches I have some trepidation about the ability to walk with the cast on her right foot. Nevertheless with her Charcot deformities I do not think there is a way to heal these short of offloading them aggressively. 2/22; the culture of the bone in the left foot that I did last week showed a few Citrobacter Koseri as well as some streptococcal species. In my attempted bone for pathology did not actually yield a lot of bone the pathologist did not identify any osteomyelitis. 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 Is promised today and aggressive debridement of the right foot and a total contact cast which surprisingly she seemed to tolerate quite well 03/13/2019 upon  evaluation today patient appears to be doing well with her total contact cast she is here for the first cast change. She had no areas of rubbing or any other complications at this point. Overall things are doing quite well. 2/26; the patient was kindly seen by Dr. Orvan Falconer of infectious disease on 2/25. She is now on IV vancomycin PICC line placement in 2 days and oral Flagyl. This was in response to her MRI showing osteomyelitis of the left calcaneus concerning for osteomyelitis. We have been putting her right leg in a total contact cast. Our nurses report drainage. She is tolerating the total contact cast well. 3/4; the patient is started her IV antibiotics and is taking IV vancomycin and oral Flagyl. She appears to be tolerating these both well. This is for osteomyelitis involving the left calcaneus. The area on the right in the setting of bilateral Charcot deformities did not have any bone infection on MRI. We put a total contact cast on her with silver alginate as the primary dressing and she returns today in follow-up. Per our intake  nurse there was too much drainage to leave this on all week therefore we will bring her back for an early change 3/8; follow-up total contact cast she is still having a lot of drainage probably too much drainage from the right foot wound will week with the same cast. She will be back on Thursday. The area on the left looks somewhat better 3/11; patient here for a total contact cast change. 3/15; patient came in for wound care evaluation. She is still on IV vancomycin and oral Flagyl for osteomyelitis in the left foot. The area on the right foot we are putting in a total contact cast. 3/18 for total contact cast change on the right foot 3/22; the patient was here for wound review today. The area on the right foot is slightly smaller in terms of surface area. However the surface of the wound is very gritty and hyper granulated. More problematically the area on the  plantar left foot has increased depth indeed in the center of this it probes to bone. We have been using Hydrofera Blue in both areas. She is on antibiotics as prescribed by infectious disease IV vancomycin and oral Flagyl 3/25; the patient is in for total contact cast change. Our intake nurse was concerned about the amount of drainage which soaked right to the multiple layers we are putting on this. Wondered about options. The wound bed actually looks as healthy as this is looked although there may not be a lot of change in dimensions things are looking a lot better. If we are going to heal this woman's wound which is in the middle of her right Charcot foot this is going to need to be offloaded. There are not many alternatives 3/29; still not a lot of improvement although the surface of the right wound looks better. We have been using Hydrofera Blue 04/17/2019 upon evaluation today patient appears to be doing well with a total contact cast. With actually having to change this twice a week. She saw [name] for wound care earlier in the week this is for the cast change today. That is due to the fact that she has significant drainage. Overall the wound is been looking excellent however. 4/5; we continue to put a total contact cast on this patient with Hydrofera Blue. Still a lot of drainage which precludes a simple weekly change or changing this twice a week. 4/8; total contact cast changed today. Apparently the wound looks better per our intake nurse 4/12; patient here for wound care evaluation. Wounds on the right foot have not changed in dimension none about a month left foot looks similar. Apparently her antibiotics that we are giving her for osteomyelitis in the left foot complete on Wednesday. We have been using Hydrofera Blue on the right foot under a total contact cast. Nurses report greenish drainage although I think this may be discoloration from the [facility] 4/15; back for cast  change wounds look quite good although still moderate amount of drainage. T contact cast reapplied otal 4/19;. Wound evaluation. Wound on the right foot is smaller surface looks healthy. There is still the divot in the middle part of this wound but I could not feel any bone. Area on the left foot also looks better She has completed her antibiotics but still has the PICC line in. 4/23; patient comes back for a total contact cast change this was done in the standard fashion no issues 4/26; wound measures slightly larger on the right foot which  is disappointing. She sees Dr. Orvan Falconer with regards to her antibiotics on Wednesday. I believe she is on vancomycin IV and oral Flagyl. 4/29; in for her routine total contact cast change. She saw Dr. Orvan Falconer this morning. He is asking about antibiotic continuation I will be in touch with him. I did not look at her wound today 5/3; patient saw Dr. Orvan Falconer on 4/28. I did send him a note asking about the continuation of perhaps an oral regimen. I have not heard back. She still has an elevated sedimentation rate at 81. Her wounds are smaller. 5/6; in for her total contact cast change. We did not look at the wound today. She has seen Dr. Orvan Falconer and is now on oral Keflex and Flagyl. She seems to be tolerating these well 5/10; she had her PICC line removed. The wound on the right foot after some initial improvement has not made any improvement even with the total contact cast. Still having a lot of drainage. Likewise the area on the left foot really is not any better either 5/14; in for a cast change today. We will look at the right foot on Monday. If this is not making any progress I am going to try to cast the left foot. Electronic Signature(s) Signed: 06/01/2019 8:06:24 AM By: Baltazar Najjar MD Entered By: Baltazar Najjar on 05/31/2019 16:10:46 -------------------------------------------------------------------------------- Physical Exam Details Patient  Name: Date of Service: [facility], [name] 05/31/2019 3:15 PM Medical Record Number: [id number] Patient Account Number: [id number] Date of Birth/Sex: Treating RN: 1955/06/05 (64 y.o. F) Primary Care Provider: MA Hedy Jacob MA LSA DA T Other Clinician: Referring Provider: Treating Provider/Extender: Baltazar Najjar MA SO UDI, ELHA MA LSA DA T Weeks in Treatment: 17 Constitutional Patient is hypertensive.. Pulse regular and within target range for patient.Marland Kitchen Respirations regular, non-labored and within target range.. Temperature is normal and within the target range for the patient.Marland Kitchen Appears in no distress. Notes Wound exam; the wound on the right in the middle of the Charcot deformity medially. We did not look at this today but replaced her total contact cast. Electronic Signature(s) Signed: 06/01/2019 8:06:24 AM By: Baltazar Najjar MD Entered By: Baltazar Najjar on 05/31/2019 16:13:17 -------------------------------------------------------------------------------- Physician Orders Details Patient Name: Date of Service: [facility], [name] 05/31/2019 3:15 PM Medical Record Number: [id number] Patient Account Number: [id number] Date of Birth/Sex: Treating RN: 1955-06-09 (64 y.o. F) Dwiggins, Shannon Primary Care Provider: MA SO UDI, [place] MA LSA DA T Other Clinician: Referring Provider: Treating Provider/Extender: Baltazar Najjar MA SO UDI, ELHA MA LSA DA T Weeks in Treatment: 64 Verbal / Phone Orders: No Diagnosis Coding ICD-10 Coding Code Description E11.621 Type 2 diabetes mellitus with foot ulcer L97.524 Non-pressure chronic ulcer of other part of left foot with necrosis of bone L97.511 Non-pressure chronic ulcer of other part of right foot limited to breakdown of skin L97.514 Non-pressure chronic ulcer of other part of right foot with necrosis of bone E11.42 Type 2 diabetes mellitus with diabetic polyneuropathy M14.671 Charcot's joint, right ankle and foot M14.672 Charcot's  joint, left ankle and foot 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 - MD visit Return A ppointment in: - Thursday or Friday for cast change Return A Dressing Change Frequency Wound #10 Left,Lateral,Plantar Foot Change Dressing every other day. Wound #9 Right,Medial Foot Do not change entire dressing for one week. Wound Cleansing May shower with protection. Primary Wound Dressing Wound #10 Left,Lateral,Plantar Foot Calcium Alginate with Silver Wound #  9 Right,Medial Foot Hydrofera Blue Secondary Dressing Wound #10 Left,Lateral,Plantar Foot Kerlix/Rolled Gauze Dry Gauze Other: - felt callous pad Wound #9 Right,Medial Foot Kerlix/Rolled Gauze Zetuvit or Kerramax Drawtex Carboflex Off-Loading Total Contact Cast to Right Lower Extremity Open toe surgical shoe to: - add felt to sandal also Electronic Signature(s) Signed: 05/31/2019 4:51:42 PM By: [name] Signed: 06/01/2019 8:06:24 AM By: Baltazar Najjar MD Entered By: [name] on 05/31/2019 15:29:54 -------------------------------------------------------------------------------- Problem List Details Patient Name: Date of Service: [facility], Ashawna J. 05/31/2019 3:15 PM Medical Record Number: [id number] Patient Account Number: [id number] Date of Birth/Sex: Treating RN: 1955/07/29 (64 y.o. F) [name] Primary Care Provider: MA SO UDI, Judene Companion MA LSA DA T Other Clinician: Referring Provider: Treating Provider/Extender: Abe People, ELHA MA LSA DA T Weeks in Treatment: 17 Active Problems ICD-10 Encounter Code Description Active Date MDM Diagnosis E11.621 Type 2 diabetes mellitus with foot ulcer 01/28/2019 No Yes L97.524 Non-pressure chronic ulcer of other part of left foot with necrosis of bone 01/28/2019 No Yes L97.511 Non-pressure chronic ulcer of other part of right foot limited to breakdown of 01/28/2019 No Yes skin L97.514  Non-pressure chronic ulcer of other part of right foot with necrosis of bone 01/28/2019 No Yes E11.42 Type 2 diabetes mellitus with diabetic polyneuropathy 01/28/2019 No Yes M14.671 Charcot's joint, right ankle and foot 01/28/2019 No Yes M14.672 Charcot's joint, left ankle and foot 01/28/2019 No Yes 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 05/31/2019 3:15 PM Medical Record Number: [id number] Patient Account Number: [id number] Date of Birth/Sex: Treating RN: 1955-06-18 (64 y.o. F) Primary Care Provider: Other Clinician: MA SO UDI, Judene Companion MA LSA DA T Referring Provider: Treating Provider/Extender: Baltazar Najjar MA SO UDI, ELHA MA LSA DA T Weeks in Treatment: 17 Subjective History of Present Illness (HPI) 64 yrs old bf here for follow up recurrent left charcot foot ulcer. She has DM. She is not a smoker. She states a blister developed 3 months ago at site of previous breakdown from 1 year ago. It was debrided at the podiatrist's office. She went to the ER and then sent here. She denies pain. Xray was negative for osteo. Culture from this clinic negative. 09/08/14 [address]erred by [name] to [name] for Charcot foot. Seen by him and MRI scheduled 09/19/14. Prior silver collagen but this was not ordered last visit, patient has been rinsing with saline alone. Re institute silver collagen every other day. F/u 2 weeks with [name] as Labor Day holiday. Supplies ordered. 09/25/14; this is a patient with a Charcot foot on the left. she has a wound over the plantar aspect of her left calcaneus. She has been seen by [name] of orthopedic surgery and has  had an MRI. She is apparently going within the next week or 2 for corrective surgery which will involve an external fixator. I don't have any of the details of this. 10/06/14; The patient is a 64 yrs old bf with a left Charcot foot and ulcer on the plantar. 12/01/14 Returns post surgery with [name]. Has follow up visit later this week with him, currently in facility and NWB over this extremity. States no drainage from foot and no current wound care. On exam callus and scab in place, suspect healed. Has external  fixator in place. Will defer to [name] for management, ok to place moisturizing cream over scabs and callus and she will call if she requires follow up here. READMISSION 01/28/2019 Patient was in the clinic in 2016 for a wound on the plantar aspect of her left calcaneus. Predominantly cared for by Dr. [name] she was referred to [name] and had corrective surgery for the position of her left ankle I believe. She had previously been here in 2015 and then prior to that in 2011 2012 that I do not have these records. More recently she has developed fairly extensive wounds on the right medial foot, left lateral foot and a small area on the right medial great toe. Right medial foot wound has been there for 6 to 7 months, left foot wound for 2 to 3 months and the right great toe only over the last few weeks. She has been followed by [name] at Dr. [name] office it sounds as though she has been undergoing callus paring and silver nitrate. The patient has been washing these off with soap and water and plan applying dry dressing Past medical history, type 2 diabetes well controlled with a recent hemoglobin A1c of 7 on 11/16, type 2 diabetes with peripheral neuropathy, previous left Charcot joint surgery bilateral Charcot joints currently, stage III chronic renal failure, hypertension, obstructive sleep apnea, history of MRSA and gastroesophageal reflux. 1/18; this is a patient with  bilateral Charcot feet. Plain x-rays that I did of both of these wounds that are either at bone or precariously close to bone were done. On the right foot this showed possible lytic destruction involving the anterior aspect of the talus which may represent a wound osteomyelitis. An MRI was recommended. On the left there was no definite lytic destruction although the wound is deep undermining's and I think probably requires an MRI on this basis in any case 1/25; patient was supposed to have her MRI last Friday of her bilateral feet however they would not do it because there was silver alginate in her dressings, will replace with calcium alginate today and will see if we can get her done today 2/1; patient did not get her MRIs done last week because of issues with the MRI department receiving orders to do bilateral feet. She has 2 deep wounds in the setting of Charcot feet deformities bilaterally. Both of them at one point had exposed bone although I had trouble demonstrating that today. I am not sure that we can offload these very aggressively as I watched her leave the clinic last week her gait is very narrow and unsteady. 2/15; the MRI of her right foot did not show osteomyelitis potentially osteonecrosis of the talus. 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 The wounds are a large wound on the right medial foot and on the left a substantial wound on the left lateral plantar calcaneus. We have been using silver alginate. Ultimately I think we need to consider a total contact cast on the right while we work through the possibility of osteomyelitis in the left. I watched her walk out with her crutches I have some trepidation about the ability to walk with the cast on her right foot. Nevertheless with her Charcot deformities I do not think there is a way to heal these short of offloading them aggressively. 2/22; the culture of the bone in the left foot  that I did last week showed a few Citrobacter Koseri  as well as some streptococcal species. In my attempted bone for pathology did not actually yield a lot of bone the pathologist did not identify any osteomyelitis. 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 Is promised today and aggressive debridement of the right foot and a total contact cast which surprisingly she seemed to tolerate quite well 03/13/2019 upon evaluation today patient appears to be doing well with her total contact cast she is here for the first cast change. She had no areas of rubbing or any other complications at this point. Overall things are doing quite well. 2/26; the patient was kindly seen by Dr. Orvan Falconer of infectious disease on 2/25. She is now on IV vancomycin PICC line placement in 2 days and oral Flagyl. This was in response to her MRI showing osteomyelitis of the left calcaneus concerning for osteomyelitis. We have been putting her right leg in a total contact cast. Our nurses report drainage. She is tolerating the total contact cast well. 3/4; the patient is started her IV antibiotics and is taking IV vancomycin and oral Flagyl. She appears to be tolerating these both well. This is for osteomyelitis involving the left calcaneus. The area on the right in the setting of bilateral Charcot deformities did not have any bone infection on MRI. We put a total contact cast on her with silver alginate as the primary dressing and she returns today in follow-up. Per our intake nurse there was too much drainage to leave this on all week therefore we will bring her back for an early change 3/8; follow-up total contact cast she is still having a lot of drainage probably too much drainage from the right foot wound will week with the same cast. She will be back on Thursday. The area on the  left looks somewhat better 3/11; patient here for a total contact cast change. 3/15; patient came in for wound care evaluation. She is still on IV vancomycin and oral Flagyl for osteomyelitis in the left foot. The area on the right foot we are putting in a total contact cast. 3/18 for total contact cast change on the right foot 3/22; the patient was here for wound review today. The area on the right foot is slightly smaller in terms of surface area. However the surface of the wound is very gritty and hyper granulated. More problematically the area on the plantar left foot has increased depth indeed in the center of this it probes to bone. We have been using Hydrofera Blue in both areas. She is on antibiotics as prescribed by infectious disease IV vancomycin and oral Flagyl 3/25; the patient is in for total contact cast change. Our intake nurse was concerned about the amount of drainage which soaked right to the multiple layers we are putting on this. Wondered about options. The wound bed actually looks as healthy as this is looked although there may not be a lot of change in dimensions things are looking a lot better. If we are going to heal this woman's wound which is in the middle of her right Charcot foot this is going to need to be offloaded. There are not many alternatives 3/29; still not a lot of improvement although the surface of the right wound looks better. We have been using Hydrofera Blue 04/17/2019 upon evaluation today patient appears to be doing well with  a total contact cast. With actually having to change this twice a week. She saw [name] for wound care earlier in the week this is for the cast change today. That is due to the fact that she has significant drainage. Overall the wound is been looking excellent however. 4/5; we continue to put a total contact cast on this patient with Hydrofera Blue. Still a lot of drainage which precludes a simple weekly change or changing  this twice a week. 4/8; total contact cast changed today. Apparently the wound looks better per our intake nurse 4/12; patient here for wound care evaluation. Wounds on the right foot have not changed in dimension none about a month left foot looks similar. Apparently her antibiotics that we are giving her for osteomyelitis in the left foot complete on Wednesday. We have been using Hydrofera Blue on the right foot under a total contact cast. Nurses report greenish drainage although I think this may be discoloration from the [facility] 4/15; back for cast change wounds look quite good although still moderate amount of drainage. T contact cast reapplied otal 4/19;. Wound evaluation. Wound on the right foot is smaller surface looks healthy. There is still the divot in the middle part of this wound but I could not feel any bone. Area on the left foot also looks better She has completed her antibiotics but still has the PICC line in. 4/23; patient comes back for a total contact cast change this was done in the standard fashion no issues 4/26; wound measures slightly larger on the right foot which is disappointing. She sees Dr. Orvan Falconer with regards to her antibiotics on Wednesday. I believe she is on vancomycin IV and oral Flagyl. 4/29; in for her routine total contact cast change. She saw Dr. Orvan Falconer this morning. He is asking about antibiotic continuation I will be in touch with him. I did not look at her wound today 5/3; patient saw Dr. Orvan Falconer on 4/28. I did send him a note asking about the continuation of perhaps an oral regimen. I have not heard back. She still has an elevated sedimentation rate at 81. Her wounds are smaller. 5/6; in for her total contact cast change. We did not look at the wound today. She has seen Dr. Orvan Falconer and is now on oral Keflex and Flagyl. She seems to be tolerating these well 5/10; she had her PICC line removed. The wound on the right foot after some initial  improvement has not made any improvement even with the total contact cast. Still having a lot of drainage. Likewise the area on the left foot really is not any better either 5/14; in for a cast change today. We will look at the right foot on Monday. If this is not making any progress I am going to try to cast the left foot. Objective Constitutional Patient is hypertensive.. Pulse regular and within target range for patient.Marland Kitchen Respirations regular, non-labored and within target range.. Temperature is normal and within the target range for the patient.Marland Kitchen Appears in no distress. Vitals Time Taken: 3:23 PM, Height: 68 in, Weight: 265 lbs, BMI: 40.3, Temperature: 98.2 F, Pulse: 75 bpm, Respiratory Rate: 18 breaths/min, Blood Pressure: 144/76 mmHg. General Notes: Wound exam; the wound on the right in the middle of the Charcot deformity medially. We did not look at this today but replaced her total contact cast. Integumentary (Hair, Skin) Wound #10 status is Open. Original cause of wound was Blister. The wound is located on the [facility]  Foot. The wound measures 1.2cm length x 1.3cm width x 1.2cm depth; 1.225cm^2 area and 1.47cm^3 volume. There is muscle and Fat Layer (Subcutaneous Tissue) Exposed exposed. There is no tunneling or undermining noted. There is a medium amount of serosanguineous drainage noted. The wound margin is thickened. There is large (67-100%) red, pink granulation within the wound bed. There is no necrotic tissue within the wound bed. Wound #9 status is Open. Original cause of wound was Blister. The wound is located on the Right,Medial Foot. The wound measures 3.7cm length x 2.5cm width x 0.5cm depth; 7.265cm^2 area and 3.632cm^3 volume. There is Fat Layer (Subcutaneous Tissue) Exposed exposed. There is no tunneling or undermining noted. There is a large amount of serosanguineous drainage noted. The wound margin is distinct with the outline attached to the wound base. There  is large (67- 100%) red granulation within the wound bed. There is no necrotic tissue within the wound bed. 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 There was a T Contact Cast otal Procedure by [name], MD. Post procedure Diagnosis Wound #9: Same as Pre-Procedure Plan Follow-up Appointments: Return Appointment in 1 week. - MD visit Return Appointment in: - Thursday or Friday for cast change Dressing Change Frequency: Wound #10 Left,Lateral,Plantar Foot: Change Dressing every other day. Wound #9 Right,Medial Foot: Do not change entire dressing for one week. Wound Cleansing: May shower with protection. Primary Wound Dressing: Wound #10 Left,Lateral,Plantar Foot: Calcium Alginate with Silver Wound #9 Right,Medial Foot: Hydrofera Blue Secondary Dressing: Wound #10 Left,Lateral,Plantar Foot: Kerlix/Rolled Gauze Dry Gauze Other: - felt callous pad Wound #9 Right,Medial Foot: Kerlix/Rolled Gauze Zetuvit or Kerramax Drawtex Carboflex Off-Loading: T Contact Cast to Right Lower Extremity otal Open toe surgical shoe to: - add felt to sandal also 1. Continue with silver alginate to the left foot 2. Hydrofera Blue on the right medial foot 3. If this wound on the right foot is no better at on Monday we will switch the cast the other side. Electronic Signature(s) Signed: 06/01/2019 8:06:24  AM By: Baltazar Najjar MD Entered By: Baltazar Najjar on 05/31/2019 16:14:08 -------------------------------------------------------------------------------- Total Contact Cast Details Patient Name: Date of Service: [facility], [name] 05/31/2019 3:15 PM Medical Record Number: [id number] Patient Account Number: [id number] Date of Birth/Sex: Treating RN: [birth year] (64 y.o. [name] Primary Care Provider: MA SO UDI, Judene Companion MA LSA DA T Other Clinician: Referring Provider: Treating Provider/Extender: Baltazar Najjar MA SO UDI, ELHA MA LSA DA T Weeks in Treatment: 17 T Contact Cast Applied for Wound Assessment: otal Wound #9 Right,Medial Foot Performed By: Physician [name], MD Post Procedure Diagnosis Same as Pre-procedure Electronic Signature(s) Signed: 05/31/2019 4:51:42 PM By: [name] Signed: 06/01/2019 8:06:24 AM By: Baltazar Najjar MD Entered By: [name] on 05/31/2019 15:54:36 -------------------------------------------------------------------------------- SuperBill Details Patient Name: Date of Service: [facility], [name] 05/31/2019 Medical Record Number: [id number] Patient Account Number: [id number] Date of Birth/Sex: Treating RN: 1955/09/05 (64 y.o. F) Dwiggins, Carollee Herter  Primary Care Provider: MA SO UDI, Judene Companion MA LSA DA T Other Clinician: Referring Provider: Treating Provider/Extender: Baltazar Najjar MA SO UDI, ELHA MA LSA DA T Weeks in Treatment: 17 Diagnosis Coding ICD-10 Codes Code Description E11.621 Type 2 diabetes mellitus with foot ulcer L97.524 Non-pressure chronic ulcer of other part of left foot with necrosis of bone L97.511 Non-pressure chronic ulcer of other part of right foot limited to breakdown of skin L97.514 Non-pressure chronic ulcer of other part of right foot with necrosis of bone E11.42 Type 2 diabetes mellitus with diabetic polyneuropathy M14.671 Charcot's joint, right ankle and foot M14.672 Charcot's joint,  left ankle and foot 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, left ankle and foot Facility Procedures CPT4 Code: [id number] Description: [phone] - APPLY TOTAL CONTACT LEG CAST ICD-10 Diagnosis Description L97.511 Non-pressure chronic ulcer of other part of right foot limited to breakdown of s Modifier: kin Quantity: 1 Physician Procedures : CPT4 Code Description Modifier [id number] - WC PHYS APPLY TOTAL CONTACT CAST ICD-10 Diagnosis Description L97.511 Non-pressure chronic ulcer of other part of right foot limited to breakdown of skin Quantity: 1 Electronic Signature(s) Signed: 06/01/2019 8:06:24 AM By: Baltazar Najjar MD Entered By: Baltazar Najjar on 05/31/2019 16:14:18

## 2019-06-03 ENCOUNTER — Encounter (HOSPITAL_BASED_OUTPATIENT_CLINIC_OR_DEPARTMENT_OTHER): Payer: Medicare Other | Admitting: Internal Medicine

## 2019-06-03 ENCOUNTER — Other Ambulatory Visit: Payer: Self-pay

## 2019-06-03 DIAGNOSIS — E11621 Type 2 diabetes mellitus with foot ulcer: Secondary | ICD-10-CM | POA: Diagnosis not present

## 2019-06-03 NOTE — Progress Notes (Signed)
[name], [name] ([id number]) Visit Report for 06/03/2019 Arrival Information Details Patient Name: Date of Service: [name] 06/03/2019 12:45 PM Medical Record Number: [id number] Patient Account Number: [id number] Date of Birth/Sex: Treating RN: Sep 10, 1955 (64 y.o. F) Dwiggins, Carollee Herter Primary Care Collyns Mcquigg: MA SO UDI, [place] MA LSA DA T Other Clinician: Referring Anush Wiedeman: Treating Gurvir Schrom/Extender: Baltazar Najjar MA SO UDI, ELHA MA LSA DA T Weeks in Treatment: 18 Visit Information History Since Last Visit Added or deleted any medications: No Patient Arrived: Crutches Any new allergies or adverse reactions: No Arrival Time: 13:28 Had a fall or experienced change in No Accompanied By: self activities of daily living that may affect Transfer Assistance: None risk of falls: Patient Identification Verified: Yes Signs or symptoms of abuse/neglect since last visito No Secondary Verification Process Completed: Yes Hospitalized since last visit: No Patient Requires Transmission-Based Precautions: No Implantable device outside of the clinic excluding No Patient Has Alerts: No cellular tissue based products placed in the center since last visit: Has Dressing in Place as Prescribed: Yes Has Footwear/Offloading in Place as Prescribed: Yes Right: T Contact Cast otal Pain Present Now: No Electronic Signature(s) Signed: 06/03/2019 5:05:18 PM By: [name] Entered By: [name] on 06/03/2019 13:29:11 -------------------------------------------------------------------------------- Encounter Discharge Information Details Patient Name: Date of Service: [facility], Sylvana J. 06/03/2019 12:45 PM Medical Record Number: [id number] Patient Account Number: [id number] Date of Birth/Sex: Treating RN: 1955/07/30 (64 y.o. [name] Primary Care Jaevian Shean: MA SO UDI, [place] MA LSA DA T Other Clinician: Referring Xianna Siverling: Treating Jolan Upchurch/Extender: Baltazar Najjar MA  SO UDI, ELHA MA LSA DA T Weeks in Treatment: 9 Encounter Discharge Information Items Post Procedure Vitals Discharge Condition: Stable Temperature (F): 98 Ambulatory Status: Crutches Pulse (bpm): 72 Discharge Destination: Home Respiratory Rate (breaths/min): 18 Transportation: Private Auto Blood Pressure (mmHg): 132/58 Accompanied By: self Schedule Follow-up Appointment: Yes Clinical Summary of Care: Electronic Signature(s) Signed: 06/03/2019 5:02:28 PM By: Shawn Stall Entered By: Shawn Stall on 06/03/2019 14:59:35 -------------------------------------------------------------------------------- Lower Extremity Assessment Details Patient Name: Date of Service: [name] 06/03/2019 12:45 PM Medical Record Number: [id number] Patient Account Number: [id number] Date of Birth/Sex: Treating RN: 07-23-1955 (64 y.o. F) [name] Primary Care Aldrich Lloyd: MA SO UDI, [place] MA LSA DA T Other Clinician: Referring Juwaun Inskeep: Treating Eniyah Eastmond/Extender: Baltazar Najjar MA SO UDI, ELHA MA LSA DA T Weeks in Treatment: 18 Edema Assessment Assessed: [Left: No] [Right: No] Edema: [Left: Yes] [Right: Yes] Calf Left: Right: Point of Measurement: 43 cm From Medial Instep 36 cm 37 cm Ankle Left: Right: Point of Measurement: 12 cm From Medial Instep 24 cm 26.5 cm Vascular Assessment Pulses: Dorsalis Pedis Palpable: [Left:Yes] [Right:Yes] Electronic Signature(s) Signed: 06/03/2019 5:05:18 PM By: [name] Entered By: [name] on 06/03/2019 13:29:46 -------------------------------------------------------------------------------- Multi Wound Chart Details Patient Name: Date of Service: Sarah Boss, [name] 06/03/2019 12:45 PM Medical Record Number: [id number] Patient Account Number: [id number] Date of Birth/Sex: Treating RN: 1955/08/18 (64 y.o. [name] Primary Care Euna Armon: MA SO UDI, [place] MA LSA DA T Other Clinician: Referring Anastashia Westerfeld: Treating  Randal Goens/Extender: Baltazar Najjar MA SO UDI, ELHA MA LSA DA T Weeks in Treatment: 18 Vital Signs Height(in): 68 Pulse(bpm): 72 Weight(lbs): 265 Blood Pressure(mmHg): 132/58 Body Mass Index(BMI): 40 Temperature(F): 98 Respiratory Rate(breaths/min): 18 Photos: [10:No Photos Left, Lateral, Plantar Foot] [9:No Photos Right, Medial Foot] [N/A:N/A N/A] Wound Location: [10:Blister] [9:Blister] [N/A:N/A] Wounding Event: [10:Diabetic Wound/Ulcer of the Lower] [9:Diabetic Wound/Ulcer of the Lower] [N/A:N/A] Primary Etiology: [10:Extremity Glaucoma, Chronic sinus] [9:Extremity  Glaucoma, Chronic sinus] [N/A:N/A] Comorbid History: [10:problems/congestion, Anemia, Sleep Apnea, Hypertension, Type II Diabetes, Rheumatoid Arthritis, Neuropathy 11/18/2018] [9:problems/congestion, Anemia, Sleep Apnea, Hypertension, Type II Diabetes, Rheumatoid Arthritis, Neuropathy  06/18/2018] [N/A:N/A] Date Acquired: [10:18] [9:18] [N/A:N/A] Weeks of Treatment: [10:Open] [9:Open] [N/A:N/A] Wound Status: [10:1.2x1.6x1] [9:3.4x2.8x0.2] [N/A:N/A] Measurements L x W x D (cm) [10:1.508] [9:7.477] [N/A:N/A] A (cm) : rea [10:1.508] [9:1.495] [N/A:N/A] Volume (cm) : [10:14.30%] [9:32.00%] [N/A:N/A] % Reduction in A rea: [10:42.90%] [9:84.90%] [N/A:N/A] % Reduction in Volume: [10:9] Starting Position 1 (o'clock): [10:10] Ending Position 1 (o'clock): [10:1.3] Maximum Distance 1 (cm): [10:Yes] [9:No] [N/A:N/A] Undermining: [10:Grade 2] [9:Grade 2] [N/A:N/A] Classification: [10:Medium] [9:Large] [N/A:N/A] Exudate A mount: [10:Serosanguineous] [9:Serosanguineous] [N/A:N/A] Exudate Type: [10:red, brown] [9:red, brown] [N/A:N/A] Exudate Color: [10:Thickened] [9:Distinct, outline attached] [N/A:N/A] Wound Margin: [10:Large (67-100%)] [9:Large (67-100%)] [N/A:N/A] Granulation A mount: [10:Red, Pink] [9:Red] [N/A:N/A] Granulation Quality: [10:None Present (0%)] [9:None Present (0%)] [N/A:N/A] Necrotic A mount: [10:Fat Layer  (Subcutaneous Tissue)] [9:Fat Layer (Subcutaneous Tissue)] [N/A:N/A] Exposed Structures: [10:Exposed: Yes Muscle: Yes Fascia: No Tendon: No Joint: No Bone: No None] [9:Exposed: Yes Fascia: No Tendon: No Muscle: No Joint: No Bone: No Small (1-33%)] [N/A:N/A] Epithelialization: [10:Debridement - Excisional] [9:N/A] [N/A:N/A] Debridement: Pre-procedure Verification/Time Out 14:00 [9:N/A] [N/A:N/A] Taken: [10:Callus, Subcutaneous] [9:N/A] [N/A:N/A] Tissue Debrided: [10:Skin/Subcutaneous Tissue] [9:N/A] [N/A:N/A] Level: [10:1.92] [9:N/A] [N/A:N/A] Debridement A (sq cm): [10:rea Curette] [9:N/A] [N/A:N/A] Instrument: [10:Moderate] [9:N/A] [N/A:N/A] Bleeding: [10:Silver Nitrate] [9:N/A] [N/A:N/A] Hemostasis A chieved: [10:0] [9:N/A] [N/A:N/A] Procedural Pain: [10:0] [9:N/A] [N/A:N/A] Post Procedural Pain: [10:Procedure was tolerated well] [9:N/A] [N/A:N/A] Debridement Treatment Response: [10:1.2x1.6x1] [9:N/A] [N/A:N/A] Post Debridement Measurements L x W x D (cm) [10:1.508] [9:N/A] [N/A:N/A] Post Debridement Volume: (cm) [10:Debridement] [9:N/A] [N/A:N/A] Procedures Performed: [10:T Contact Cast otal] Treatment Notes Electronic Signature(s) Signed: 06/03/2019 4:51:55 PM By: Baltazar Najjar MD Signed: 06/03/2019 5:54:52 PM By: Zandra Abts RN, BSN Entered By: Baltazar Najjar on 06/03/2019 14:05:03 -------------------------------------------------------------------------------- Multi-Disciplinary Care Plan Details Patient Name: Date of Service: [employer], Loza J. 06/03/2019 12:45 PM Medical Record Number: [id number] Patient Account Number: [id number] Date of Birth/Sex: Treating RN: 1956/01/07 (64 y.o. [name] Primary Care Anaria Kroner: MA SO UDI, Judene Companion MA LSA DA T Other Clinician: Referring Rhealynn Myhre: Treating Malene Blaydes/Extender: Baltazar Najjar MA SO UDI, ELHA MA LSA DA T Weeks in Treatment: 49 Active Inactive Wound/Skin Impairment Nursing Diagnoses: Impaired tissue  integrity Knowledge deficit related to ulceration/compromised skin integrity Goals: Patient/caregiver will verbalize understanding of skin care regimen Date Initiated: 01/28/2019 Target Resolution Date: 06/14/2019 Goal Status: Active Interventions: Assess patient/caregiver ability to obtain necessary supplies Assess patient/caregiver ability to perform ulcer/skin care regimen upon admission and as needed Assess ulceration(s) every visit Provide education on ulcer and skin care Notes: Electronic Signature(s) Signed: 06/03/2019 5:54:52 PM By: Zandra Abts RN, BSN Entered By: Zandra Abts on 06/03/2019 13:35:11 -------------------------------------------------------------------------------- Pain Assessment Details Patient Name: Date of Service: Sarah Boss, Arasely J. 06/03/2019 12:45 PM Medical Record Number: [id number] Patient Account Number: [id number] Date of Birth/Sex: Treating RN: February 28, 1955 (64 y.o. [name] Primary Care Cleon Signorelli: MA SO UDI, [place] MA LSA DA T Other Clinician: Referring Glynn Freas: Treating Bethanee Redondo/Extender: Baltazar Najjar MA SO UDI, ELHA MA LSA DA T Weeks in Treatment: 58 Active Problems Location of Pain Severity and Description of Pain Patient Has Paino No Site Locations Pain Management and Medication Current Pain Management: Electronic Signature(s) Signed: 06/03/2019 5:05:18 PM By: [name] Entered By: [name] on 06/03/2019 13:29:38 -------------------------------------------------------------------------------- Patient/Caregiver Education Details Patient Name: Date of Service: Gypsy Decant 5/17/2021andnbsp12:45 PM Medical Record Number: [id number] Patient  Account Number: [id number] Date of Birth/Gender: Treating RN: Sep 06, 1955 (64 y.o. [name] Primary Care Physician: MA SO UDI, Willette Pa MA LSA DA T Other Clinician: Referring Physician: Treating Physician/Extender: Linton Ham MA SO UDI, ELHA MA LSA DA  T Weeks in Treatment: 39 Education Assessment Education Provided To: Patient Education Topics Provided Wound/Skin Impairment: Methods: Explain/Verbal Responses: State content correctly [employer]) Signed: 06/03/2019 5:54:52 PM By: Levan Hurst RN, BSN Entered By: Levan Hurst on 06/03/2019 13:35:23 -------------------------------------------------------------------------------- Wound Assessment Details Patient Name: Date of Service: [facility], Graycen J. 06/03/2019 12:45 PM Medical Record Number: [id number] Patient Account Number: [id number] Date of Birth/Sex: Treating RN: 18-Jul-1955 (63 y.o. F) Dwiggins, Shannon Primary Care Levin Dagostino: MA SO UDI, [place] MA LSA DA T Other Clinician: Referring Thurmon Mizell: Treating Ramell Wacha/Extender: Linton Ham MA SO UDI, ELHA MA LSA DA T Weeks in Treatment: 18 Wound Status Wound Number: 10 Primary Diabetic Wound/Ulcer of the Lower Extremity Etiology: Wound Location: Left, Lateral, Plantar Foot Wound Open Wounding Event: Blister Status: Date Acquired: 11/18/2018 Comorbid Glaucoma, Chronic sinus problems/congestion, Anemia, Sleep Weeks Of Treatment: 18 History: Apnea, Hypertension, Type II Diabetes, Rheumatoid Arthritis, Clustered Wound: No Neuropathy Wound Measurements Length: (cm) 1.2 Width: (cm) 1.6 Depth: (cm) 1 Area: (cm) 1.508 Volume: (cm) 1.508 % Reduction in Area: 14.3% % Reduction in Volume: 42.9% Epithelialization: None Tunneling: No Undermining: Yes Starting Position (o'clock): 9 Ending Position (o'clock): 10 Maximum Distance: (cm) 1.3 Wound Description Classification: Grade 2 Wound Margin: Thickened Exudate Amount: Medium Exudate Type: Serosanguineous Exudate Color: red, brown Foul Odor After Cleansing: No Slough/Fibrino No Wound Bed Granulation Amount: Large (67-100%) Exposed Structure Granulation Quality: Red, Pink Fascia Exposed: No Necrotic Amount: None Present (0%) Fat Layer (Subcutaneous  Tissue) Exposed: Yes Tendon Exposed: No Muscle Exposed: Yes Necrosis of Muscle: No Joint Exposed: No Bone Exposed: No Treatment Notes Wound #10 (Left, Lateral, Plantar Foot) 1. Cleanse With Wound Cleanser Soap and water 2. Periwound Care Moisturizing lotion 3. Primary Dressing Applied Calcium Alginate Ag 4. Secondary Dressing Dry Gauze Roll Gauze Drawtex 5. Secured With Medipore tape 7. Footwear/Offloading device applied T Contact Cast otal Notes TCC applied by MD. Electronic Signature(s) Signed: 06/03/2019 5:05:18 PM By: Kela Millin Entered By: Kela Millin on 06/03/2019 13:30:44 -------------------------------------------------------------------------------- Wound Assessment Details Patient Name: Date of Service: [facility], Ayza J. 06/03/2019 12:45 PM Medical Record Number: [id number] Patient Account Number: [id number] Date of Birth/Sex: Treating RN: 05/09/1955 (64 y.o. F) Dwiggins, Shannon Primary Care Adja Ruff: MA SO UDI, [place] MA LSA DA T Other Clinician: Referring Blakelynn Scheeler: Treating Jacorion Klem/Extender: Linton Ham MA SO UDI, ELHA MA LSA DA T Weeks in Treatment: 18 Wound Status Wound Number: 9 Primary Diabetic Wound/Ulcer of the Lower Extremity Etiology: Wound Location: Right, Medial Foot Wound Open Wounding Event: Blister Status: Date Acquired: 06/18/2018 Comorbid Glaucoma, Chronic sinus problems/congestion, Anemia, Sleep Weeks Of Treatment: 18 History: Apnea, Hypertension, Type II Diabetes, Rheumatoid Arthritis, Clustered Wound: No Neuropathy Wound Measurements Length: (cm) 3.4 Width: (cm) 2.8 Depth: (cm) 0.2 Area: (cm) 7.477 Volume: (cm) 1.495 % Reduction in Area: 32% % Reduction in Volume: 84.9% Epithelialization: Small (1-33%) Tunneling: No Undermining: No Wound Description Classification: Grade 2 Wound Margin: Distinct, outline attached Exudate Amount: Large Exudate Type: Serosanguineous Exudate Color: red, brown Wound  Bed Granulation Amount: Large (67-100%) Granulation Quality: Red Necrotic Amount: None Present (0%) Foul Odor After Cleansing: No Slough/Fibrino No Exposed Structure Fascia Exposed: No Fat Layer (Subcutaneous Tissue) Exposed: Yes Tendon Exposed: No Muscle Exposed: No Joint Exposed: No Bone Exposed: No Treatment Notes Wound #9 (  Right, Medial Foot) 1. Cleanse With Wound Cleanser Soap and water 2. Periwound Care Moisturizing lotion 3. Primary Dressing Applied Polymem Ag 4. Secondary Dressing ABD Pad Dry Gauze Roll Gauze Kerramax/Xtrasorb Other secondary dressing (specify in notes) 7. Footwear/Offloading device applied Felt/Foam Surgical shoe Notes carboflex as secondary. Electronic Signature(s) Signed: 06/03/2019 5:05:18 PM By: [name] Entered By: [name] on 06/03/2019 13:31:25 -------------------------------------------------------------------------------- Vitals Details Patient Name: Date of Service: [facility], Samariya J. 06/03/2019 12:45 PM Medical Record Number: [id number] Patient Account Number: [id number] Date of Birth/Sex: Treating RN: [birth year] (64 y.o. F) Dwiggins, Shannon Primary Care Sadae Arrazola: MA SO UDI, [place] MA LSA DA T Other Clinician: Referring Yale Golla: Treating Tanisia Yokley/Extender: Baltazar Najjar MA SO UDI, ELHA MA LSA DA T Weeks in Treatment: 18 Vital Signs Time Taken: 13:15 Temperature (F): 98 Height (in): 68 Pulse (bpm): 72 Weight (lbs): 265 Respiratory Rate (breaths/min): 18 Body Mass Index (BMI): 40.3 Blood Pressure (mmHg): 132/58 Reference Range: 80 - 120 mg / dl Electronic Signature(s) Signed: 06/03/2019 5:05:18 PM By: [name] Entered By: [name] on 06/03/2019 13:29:31

## 2019-06-03 NOTE — Progress Notes (Signed)
[name], [name] ([id number]) Visit Report for 06/03/2019 Debridement Details Patient Name: Date of Service: Sarah, [name] 06/03/2019 12:45 PM Medical Record Number: [id number] Patient Account Number: [id number] Date of Birth/Sex: Treating RN: Jul 25, 1955 (64 y.o. [name] Primary Care Provider: MA SO UDI, Paige MA LSA DA T Other Clinician: Referring Provider: Treating Provider/Extender: Baltazar Najjar MA SO UDI, ELHA MA LSA DA T Weeks in Treatment: 18 Debridement Performed for Assessment: Wound #10 Left,Lateral,Plantar Foot Performed By: Physician [name], MD Debridement Type: Debridement Severity of Tissue Pre Debridement: Fat layer exposed Level of Consciousness (Pre-procedure): Awake and Alert Pre-procedure Verification/Time Out Yes - 14:00 Taken: Start Time: 14:00 T Area Debrided (L x W): otal 1.2 (cm) x 1.6 (cm) = 1.92 (cm) Tissue and other material debrided: Viable, Non-Viable, Callus, Subcutaneous Level: Skin/Subcutaneous Tissue Debridement Description: Excisional Instrument: Curette Bleeding: Moderate Hemostasis Achieved: Silver Nitrate End Time: 14:02 Procedural Pain: 0 Post Procedural Pain: 0 Response to Treatment: Procedure was tolerated well Level of Consciousness (Post- Awake and Alert procedure): Post Debridement Measurements of Total Wound Length: (cm) 1.2 Width: (cm) 1.6 Depth: (cm) 1 Volume: (cm) 1.508 Character of Wound/Ulcer Post Debridement: Improved Severity of Tissue Post Debridement: Fat layer exposed Post Procedure Diagnosis Same as Pre-procedure Electronic Signature(s) Signed: 06/03/2019 4:51:55 PM By: Baltazar Najjar MD Signed: 06/03/2019 5:54:52 PM By: Zandra Abts RN, BSN Entered By: Baltazar Najjar on 06/03/2019 14:05:24 -------------------------------------------------------------------------------- HPI Details Patient Name: Date of Service: [name], [name] 06/03/2019 12:45 PM Medical Record Number:  [id number] Patient Account Number: [id number] Date of Birth/Sex: Treating RN: [birth year] (64 y.o. [name] Primary Care Provider: MA SO UDI, [place] MA LSA DA T Other Clinician: Referring Provider: Treating Provider/Extender: Baltazar Najjar MA SO UDI, ELHA MA LSA DA T Weeks in Treatment: 18 History of Present Illness HPI Description: 64 yrs old bf here for follow up recurrent left charcot foot ulcer. She has DM. She is not a smoker. She states a blister developed 3 months ago at site of previous breakdown from 1 year ago. It was debrided at the podiatrist's office. She went to the ER and then sent here. She denies pain. Xray was negative for osteo. Culture from this clinic negative. 09/08/14 [name] to [name] for Charcot foot. Seen by him and MRI scheduled 09/19/14. Prior silver collagen but this was not ordered last visit, patient has been rinsing with saline alone. Re institute silver collagen every other day. F/u 2 weeks with [name] as Labor Day holiday. Supplies ordered. 09/25/14; this is a patient with a Charcot foot on the left. she has a wound over the plantar aspect of her left calcaneus. She has been seen by [name] of orthopedic surgery and has had an MRI. She is apparently going within the next week or 2 for corrective surgery which will involve an external fixator. I don't have any of the details of this. 10/06/14; The patient is a 64 yrs old bf with a left Charcot foot and ulcer on the plantar. 12/01/14 Returns post surgery with [name]. Has follow up visit later this week with him, currently in facility and NWB over this extremity. States no drainage from foot and no current wound care. On exam callus and scab in place, suspect healed. Has external fixator in place. Will defer to [name] for management, ok to place moisturizing cream over scabs and callus and she will call if she requires follow up here. READMISSION 01/28/2019 Patient was in  the  clinic in 2016 for a wound on the plantar aspect of her left calcaneus. Predominantly cared for by Dr. [name] she was referred to [name] and had corrective surgery for the position of her left ankle I believe. She had previously been here in 2015 and then prior to that in 2011 2012 that I do not have these records. More recently she has developed fairly extensive wounds on the right medial foot, left lateral foot and a small area on the right medial great toe. Right medial foot wound has been there for 6 to 7 months, left foot wound for 2 to 3 months and the right great toe only over the last few weeks. She has been followed by [name] at Dr. [name] office it sounds as though she has been undergoing callus paring and silver nitrate. The patient has been washing these off with soap and water and plan applying dry dressing Past medical history, type 2 diabetes well controlled with a recent hemoglobin A1c of 7 on 11/16, type 2 diabetes with peripheral neuropathy, previous left Charcot joint surgery bilateral Charcot joints currently, stage III chronic renal failure, hypertension, obstructive sleep apnea, history of MRSA and gastroesophageal reflux. 1/18; this is a patient with bilateral Charcot feet. Plain x-rays that I did of both of these wounds that are either at bone or precariously close to bone were done. On the right foot this showed possible lytic destruction involving the anterior aspect of the talus which may represent a wound osteomyelitis. An MRI was recommended. On the left there was no definite lytic destruction although the wound is deep undermining's and I think probably requires an MRI on this basis in any case 1/25; patient was supposed to have her MRI last Friday of her bilateral feet however they would not do it because there was silver alginate in her dressings, will replace with calcium alginate today and will see if we can get her done today 2/1; patient did not get  her MRIs done last week because of issues with the MRI department receiving orders to do bilateral feet. She has 2 deep wounds in the setting of Charcot feet deformities bilaterally. Both of them at one point had exposed bone although I had trouble demonstrating that today. I am not sure that we can offload these very aggressively as I watched her leave the clinic last week her gait is very narrow and unsteady. 2/15; the MRI of her right foot did not show osteomyelitis potentially osteonecrosis of the talus. 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 The wounds are a large wound on the right medial foot and on the left a substantial wound on the left lateral plantar calcaneus. We have been using silver alginate. Ultimately I think we need to consider a total contact cast on the right while we work through the possibility of osteomyelitis in the left. I watched her walk out with her crutches I have some trepidation about the ability to walk with the cast on her right foot. Nevertheless with her Charcot deformities I do not think there is a way to heal these short of offloading them aggressively. 2/22; the culture of the bone in the left foot that I did last week showed a few Citrobacter Koseri as well as some streptococcal species. In my attempted bone for pathology did not actually yield a lot of bone the pathologist did not identify any osteomyelitis. 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 Is promised today and aggressive debridement of the right foot and a total contact cast which surprisingly she seemed to tolerate quite well 03/13/2019 upon evaluation today patient appears to be doing well with her total contact cast she is here for the first cast change. She had no areas of rubbing or any other  complications at this point. Overall things are doing quite well. 2/26; the patient was kindly seen by Dr. Megan Salon of infectious disease on 2/25. She is now on IV vancomycin PICC line placement in 2 days and oral Flagyl. This was in response to her MRI showing osteomyelitis of the left calcaneus concerning for osteomyelitis. We have been putting her right leg in a total contact cast. Our nurses report drainage. She is tolerating the total contact cast well. 3/4; the patient is started her IV antibiotics and is taking IV vancomycin and oral Flagyl. She appears to be tolerating these both well. This is for osteomyelitis involving the left calcaneus. The area on the right in the setting of bilateral Charcot deformities did not have any bone infection on MRI. We put a total contact cast on her with silver alginate as the primary dressing and she returns today in follow-up. Per our intake nurse there was too much drainage to leave this on all week therefore we will bring her back for an early change 3/8; follow-up total contact cast she is still having a lot of drainage probably too much drainage from the right foot wound will week with the same cast. She will be back on Thursday. The area on the left looks somewhat better 3/11; patient here for a total contact cast change. 3/15; patient came in for wound care evaluation. She is still on IV vancomycin and oral Flagyl for osteomyelitis in the left foot. The area on the right foot we are putting in a total contact cast. 3/18 for total contact cast change on the right foot 3/22; the patient was here for wound review today. The area on the right foot is slightly smaller in terms of surface area. However the surface of the wound is very gritty and hyper granulated. More problematically the area on the plantar left foot has increased depth indeed in the center of this it probes to bone. We have been using Hydrofera Blue in both areas. She is on antibiotics as  prescribed by infectious disease IV vancomycin and oral Flagyl 3/25; the patient is in for total contact cast change. Our intake nurse was concerned about the amount of drainage which soaked right to the multiple layers we are putting on this. Wondered about options. The wound bed actually looks as healthy as this is looked although there may not be a lot of change in dimensions things are looking a lot better. If we are going to heal this woman's wound which is in the middle of her right Charcot foot this is going to need to be offloaded. There are not many alternatives 3/29; still not a lot of improvement although the surface of the right wound looks better. We have been using Hydrofera Blue 04/17/2019 upon evaluation today patient appears to be doing well with a total contact cast. With actually having to change this twice a week. She saw Dr. Dellia Nims for wound care earlier in the week this is for the cast change today. That is  due to the fact that she has significant drainage. Overall the wound is been looking excellent however. 4/5; we continue to put a total contact cast on this patient with Hydrofera Blue. Still a lot of drainage which precludes a simple weekly change or changing this twice a week. 4/8; total contact cast changed today. Apparently the wound looks better per our intake nurse 4/12; patient here for wound care evaluation. Wounds on the right foot have not changed in dimension none about a month left foot looks similar. Apparently her antibiotics that we are giving her for osteomyelitis in the left foot complete on Wednesday. We have been using Hydrofera Blue on the right foot under a total contact cast. Nurses report greenish drainage although I think this may be discoloration from the [facility] 4/15; back for cast change wounds look quite good although still moderate amount of drainage. T contact cast reapplied otal 4/19;. Wound evaluation. Wound on the right foot is smaller  surface looks healthy. There is still the divot in the middle part of this wound but I could not feel any bone. Area on the left foot also looks better She has completed her antibiotics but still has the PICC line in. 4/23; patient comes back for a total contact cast change this was done in the standard fashion no issues 4/26; wound measures slightly larger on the right foot which is disappointing. She sees Dr. Orvan Falconer with regards to her antibiotics on Wednesday. I believe she is on vancomycin IV and oral Flagyl. 4/29; in for her routine total contact cast change. She saw Dr. Orvan Falconer this morning. He is asking about antibiotic continuation I will be in touch with him. I did not look at her wound today 5/3; patient saw Dr. Orvan Falconer on 4/28. I did send him a note asking about the continuation of perhaps an oral regimen. I have not heard back. She still has an elevated sedimentation rate at 81. Her wounds are smaller. 5/6; in for her total contact cast change. We did not look at the wound today. She has seen Dr. Orvan Falconer and is now on oral Keflex and Flagyl. She seems to be tolerating these well 5/10; she had her PICC line removed. The wound on the right foot after some initial improvement has not made any improvement even with the total contact cast. Still having a lot of drainage. Likewise the area on the left foot really is not any better either 5/14; in for a cast change today. We will look at the right foot on Monday. If this is not making any progress I am going to try to cast the left foot. 5/17; the wound on the right foot is absolutely no different. I am going to put ointment on this area and change the cast of the left foot. We have been using silver alginate there Electronic Signature(s) Signed: 06/03/2019 4:51:55 PM By: Baltazar Najjar MD Entered By: Baltazar Najjar on 06/03/2019 14:05:56 -------------------------------------------------------------------------------- Physical Exam  Details Patient Name: Date of Service: [name], Jeremy J. 06/03/2019 12:45 PM Medical Record Number: [id number] Patient Account Number: [id number] Date of Birth/Sex: Treating RN: 1955/09/17 (64 y.o. [name] Primary Care Provider: MA SO UDI, [place] MA LSA DA T Other Clinician: Referring Provider: Treating Provider/Extender: Baltazar Najjar MA SO UDI, ELHA MA LSA DA T Weeks in Treatment: 18 Constitutional Sitting or standing Blood Pressure is within target range for patient.. Pulse regular and within target range for patient.Marland Kitchen Respirations regular, non-labored and within target range.Marland Kitchen Appears  in no distress. Notes Wound exam; the wound is on the right in the middle of the Charcot deformity. We made absolutely no improvement in the last several weeks. The surface looks reasonably healthy but we cannot get granulation even in the setting of total contact casting In the meantime the area on the left foot has a deep probing tunnel towards the middle of the foot there is no exposed bone. Extensive debridement with a #5 curette removing skin subcutaneous tissue and adherent debris to the wound surface hemostasis with silver nitrate Electronic Signature(s) Signed: 06/03/2019 4:51:55 PM By: Baltazar Najjar MD Entered By: Baltazar Najjar on 06/03/2019 14:07:13 -------------------------------------------------------------------------------- Physician Orders Details Patient Name: Date of Service: [facility], Elliana J. 06/03/2019 12:45 PM Medical Record Number: [id number] Patient Account Number: [id number] Date of Birth/Sex: Treating RN: 1955/07/06 (64 y.o. [name] Primary Care Provider: MA SO UDI, [place] MA LSA DA T Other Clinician: Referring Provider: Treating Provider/Extender: Baltazar Najjar MA SO UDI, ELHA MA LSA DA T Weeks in Treatment: 10 Verbal / Phone Orders: No Diagnosis Coding ICD-10 Coding Code Description E11.621 Type 2 diabetes mellitus with foot ulcer L97.524  Non-pressure chronic ulcer of other part of left foot with necrosis of bone L97.511 Non-pressure chronic ulcer of other part of right foot limited to breakdown of skin L97.514 Non-pressure chronic ulcer of other part of right foot with necrosis of bone E11.42 Type 2 diabetes mellitus with diabetic polyneuropathy M14.671 Charcot's joint, right ankle and foot M14.672 Charcot's joint, left ankle and foot 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 - MD visit Return A ppointment in: - Friday PM for cast change Return A Dressing Change Frequency Wound #10 Left,Lateral,Plantar Foot Do not change entire dressing for one week. Wound #9 Right,Medial Foot Change Dressing every other day. Wound Cleansing May shower with protection. Primary Wound Dressing Wound #10 Left,Lateral,Plantar Foot Calcium Alginate with Silver Wound #9 Right,Medial Foot Polymem Silver Secondary Dressing Wound #10 Left,Lateral,Plantar Foot Foam Kerlix/Rolled Gauze Dry Gauze Drawtex Wound #9 Right,Medial Foot Kerlix/Rolled Gauze ABD pad Zetuvit or Kerramax Off-Loading Total Contact Cast to Left Lower Extremity Open toe surgical shoe to: - foot Electronic Signature(s) Signed: 06/03/2019 4:51:55 PM By: Baltazar Najjar MD Signed: 06/03/2019 5:54:52 PM By: Zandra Abts RN, BSN Entered By: Zandra Abts on 06/03/2019 14:02:58 -------------------------------------------------------------------------------- Problem List Details Patient Name: Date of Service: [name], Marlea J. 06/03/2019 12:45 PM Medical Record Number: [id number] Patient Account Number: [id number] Date of Birth/Sex: Treating RN: April 10, 1955 (64 y.o. [name] Primary Care Provider: MA SO UDI, Judene Companion MA LSA DA T Other Clinician: Referring Provider: Treating Provider/Extender: Abe People, ELHA MA LSA DA T Weeks in Treatment: 18 Active  Problems ICD-10 Encounter Code Description Active Date MDM Diagnosis E11.621 Type 2 diabetes mellitus with foot ulcer 01/28/2019 No Yes L97.524 Non-pressure chronic ulcer of other part of left foot with necrosis of bone 01/28/2019 No Yes L97.511 Non-pressure chronic ulcer of other part of right foot limited to breakdown of 01/28/2019 No Yes skin L97.514 Non-pressure chronic ulcer of other part of right foot with necrosis of bone 01/28/2019 No Yes E11.42 Type 2 diabetes mellitus with diabetic polyneuropathy 01/28/2019 No Yes M14.671 Charcot's joint, right ankle and foot 01/28/2019 No Yes M14.672 Charcot's joint, left ankle and foot 01/28/2019 No Yes 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 [name] Primary Care Provider: MA SO UDI, [place] MA LSA DA T Other Clinician: Referring Provider: Treating Provider/Extender: Baltazar Najjar MA SO UDI, ELHA MA LSA DA T Weeks in Treatment: 18 Subjective History of Present Illness (HPI) 63 yrs old bf here for follow up recurrent left charcot foot ulcer. She has DM. She is not a smoker. She states a blister developed 3 months ago at site of previous breakdown from 1 year ago. It was debrided at the podiatrist's office. She went to the ER and then sent here. She denies pain. Xray was negative for osteo. Culture from this clinic negative. 09/08/14 Referred by [name] to [name] for Charcot foot. Seen by him and MRI scheduled 09/19/14. Prior silver collagen but this  was not ordered last visit, patient has been rinsing with saline alone. Re institute silver collagen every other day. F/u 2 weeks with [name] as Labor Day holiday. Supplies ordered. 09/25/14; this is a patient with a Charcot foot on the left. she has a wound over the plantar aspect of her left calcaneus. She has been seen by [name] of orthopedic surgery and has had an MRI. She is apparently going within the next week or 2 for corrective surgery which will involve an external fixator. I don't have any of the details of this. 10/06/14; The patient is a 64 yrs old bf with a left Charcot foot and ulcer on the plantar. 12/01/14 Returns post surgery with [name]. Has follow up visit later this week with him, currently in facility and NWB over this extremity. States no drainage from foot and no current wound care. On exam callus and scab in place, suspect healed. Has external fixator in place. Will defer to [name] for management, ok to place moisturizing cream over scabs and callus and she will call if she requires follow up here. READMISSION 01/28/2019 Patient was in the clinic in 2016 for a wound on the plantar aspect of her left calcaneus. Predominantly cared for by [name] she was referred to [name] and had corrective surgery for the position of her left ankle I believe. She had previously been here in 2015 and then prior to that in 2011 2012 that I do not have these records. More recently she has developed fairly extensive wounds on the right medial foot, left lateral foot and a small area on the right medial great toe. Right medial foot wound has been there for 6 to 7 months, left foot wound for 2 to 3 months and the right great toe only over the last few weeks. She has been followed by [name] at Dr. Laverta [place] office it sounds as though she has been undergoing callus paring and silver nitrate. The patient has been washing these off with soap and water and plan applying dry  dressing Past medical history, type 2 diabetes well controlled with a recent hemoglobin A1c of 7 on 11/16, type 2 diabetes with peripheral neuropathy, previous left Charcot joint surgery bilateral Charcot joints currently, stage III chronic renal failure, hypertension, obstructive sleep apnea, history of MRSA and gastroesophageal reflux. 1/18; this is a patient with bilateral Charcot feet. Plain x-rays that I did of both of these wounds that are either at bone or precariously close to bone were  done. On the right foot this showed possible lytic destruction involving the anterior aspect of the talus which may represent a wound osteomyelitis. An MRI was recommended. On the left there was no definite lytic destruction although the wound is deep undermining's and I think probably requires an MRI on this basis in any case 1/25; patient was supposed to have her MRI last Friday of her bilateral feet however they would not do it because there was silver alginate in her dressings, will replace with calcium alginate today and will see if we can get her done today 2/1; patient did not get her MRIs done last week because of issues with the MRI department receiving orders to do bilateral feet. She has 2 deep wounds in the setting of Charcot feet deformities bilaterally. Both of them at one point had exposed bone although I had trouble demonstrating that today. I am not sure that we can offload these very aggressively as I watched her leave the clinic last week her gait is very narrow and unsteady. 2/15; the MRI of her right foot did not show osteomyelitis potentially osteonecrosis of the talus. 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 The wounds are a large wound on the right medial foot and on the left a substantial wound on the left lateral plantar calcaneus. We have been using silver alginate. Ultimately I think we need to consider a total contact cast on the  right while we work through the possibility of osteomyelitis in the left. I watched her walk out with her crutches I have some trepidation about the ability to walk with the cast on her right foot. Nevertheless with her Charcot deformities I do not think there is a way to heal these short of offloading them aggressively. 2/22; the culture of the bone in the left foot that I did last week showed a few Citrobacter Koseri as well as some streptococcal species. In my attempted bone for pathology did not actually yield a lot of bone the pathologist did not identify any osteomyelitis. 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 Is promised today and aggressive debridement of the right foot and a total contact cast which surprisingly she seemed to tolerate quite well 03/13/2019 upon evaluation today patient appears to be doing well with her total contact cast she is here for the first cast change. She had no areas of rubbing or any other complications at this point. Overall things are doing quite well. 2/26; the patient was kindly seen by Dr. Orvan Falconer of infectious disease on 2/25. She is now on IV vancomycin PICC line placement in 2 days and oral Flagyl. This was in response to her MRI showing osteomyelitis of the left calcaneus concerning for osteomyelitis. We have been putting her right leg in a total contact cast. Our nurses report drainage. She is tolerating the total contact cast well. 3/4; the patient is started her IV antibiotics and is taking IV vancomycin and oral Flagyl. She appears to be tolerating these both well. This is for osteomyelitis involving the left calcaneus. The area on the right in the setting of bilateral Charcot deformities did not have any bone infection on MRI. We put a total contact cast on her with silver alginate as the primary  dressing and she returns today in follow-up. Per our  intake nurse there was too much drainage to leave this on all week therefore we will bring her back for an early change 3/8; follow-up total contact cast she is still having a lot of drainage probably too much drainage from the right foot wound will week with the same cast. She will be back on Thursday. The area on the left looks somewhat better 3/11; patient here for a total contact cast change. 3/15; patient came in for wound care evaluation. She is still on IV vancomycin and oral Flagyl for osteomyelitis in the left foot. The area on the right foot we are putting in a total contact cast. 3/18 for total contact cast change on the right foot 3/22; the patient was here for wound review today. The area on the right foot is slightly smaller in terms of surface area. However the surface of the wound is very gritty and hyper granulated. More problematically the area on the plantar left foot has increased depth indeed in the center of this it probes to bone. We have been using Hydrofera Blue in both areas. She is on antibiotics as prescribed by infectious disease IV vancomycin and oral Flagyl 3/25; the patient is in for total contact cast change. Our intake nurse was concerned about the amount of drainage which soaked right to the multiple layers we are putting on this. Wondered about options. The wound bed actually looks as healthy as this is looked although there may not be a lot of change in dimensions things are looking a lot better. If we are going to heal this woman's wound which is in the middle of her right Charcot foot this is going to need to be offloaded. There are not many alternatives 3/29; still not a lot of improvement although the surface of the right wound looks better. We have been using Hydrofera Blue 04/17/2019 upon evaluation today patient appears to be doing well with a total contact cast. With actually having to change this twice a  week. She saw [name] for wound care earlier in the week this is for the cast change today. That is due to the fact that she has significant drainage. Overall the wound is been looking excellent however. 4/5; we continue to put a total contact cast on this patient with Hydrofera Blue. Still a lot of drainage which precludes a simple weekly change or changing this twice a week. 4/8; total contact cast changed today. Apparently the wound looks better per our intake nurse 4/12; patient here for wound care evaluation. Wounds on the right foot have not changed in dimension none about a month left foot looks similar. Apparently her antibiotics that we are giving her for osteomyelitis in the left foot complete on Wednesday. We have been using Hydrofera Blue on the right foot under a total contact cast. Nurses report greenish drainage although I think this may be discoloration from the [facility] 4/15; back for cast change wounds look quite good although still moderate amount of drainage. T contact cast reapplied otal 4/19;. Wound evaluation. Wound on the right foot is smaller surface looks healthy. There is still the divot in the middle part of this wound but I could not feel any bone. Area on the left foot also looks better She has completed her antibiotics but still has the PICC line in. 4/23; patient comes back for a total contact cast change this was done in the standard fashion no issues 4/26; wound measures slightly larger on the right foot which  is disappointing. She sees Dr. Orvan Falconer with regards to her antibiotics on Wednesday. I believe she is on vancomycin IV and oral Flagyl. 4/29; in for her routine total contact cast change. She saw Dr. Orvan Falconer this morning. He is asking about antibiotic continuation I will be in touch with him. I did not look at her wound today 5/3; patient saw Dr. Orvan Falconer on 4/28. I did send him a note asking about the continuation of perhaps an oral regimen. I  have not heard back. She still has an elevated sedimentation rate at 81. Her wounds are smaller. 5/6; in for her total contact cast change. We did not look at the wound today. She has seen Dr. Orvan Falconer and is now on oral Keflex and Flagyl. She seems to be tolerating these well 5/10; she had her PICC line removed. The wound on the right foot after some initial improvement has not made any improvement even with the total contact cast. Still having a lot of drainage. Likewise the area on the left foot really is not any better either 5/14; in for a cast change today. We will look at the right foot on Monday. If this is not making any progress I am going to try to cast the left foot. 5/17; the wound on the right foot is absolutely no different. I am going to put ointment on this area and change the cast of the left foot. We have been using silver alginate there Objective Constitutional Sitting or standing Blood Pressure is within target range for patient.. Pulse regular and within target range for patient.Marland Kitchen Respirations regular, non-labored and within target range.Marland Kitchen Appears in no distress. Vitals Time Taken: 1:15 PM, Height: 68 in, Weight: 265 lbs, BMI: 40.3, Temperature: 98 F, Pulse: 72 bpm, Respiratory Rate: 18 breaths/min, Blood Pressure: 132/58 mmHg. General Notes: Wound exam; the wound is on the right in the middle of the Charcot deformity. We made absolutely no improvement in the last several weeks. The surface looks reasonably healthy but we cannot get granulation even in the setting of total contact casting ooIn the meantime the area on the left foot has a deep probing tunnel towards the middle of the foot there is no exposed bone. Extensive debridement with a #5 curette removing skin subcutaneous tissue and adherent debris to the wound surface hemostasis with silver nitrate Integumentary (Hair, Skin) Wound #10 status is Open. Original cause of wound was Blister. The wound is located on the  Left,Lateral,Plantar Foot. The wound measures 1.2cm length x 1.6cm width x 1cm depth; 1.508cm^2 area and 1.508cm^3 volume. There is muscle and Fat Layer (Subcutaneous Tissue) Exposed exposed. There is no tunneling noted, however, there is undermining starting at 9:00 and ending at 10:00 with a maximum distance of 1.3cm. There is a medium amount of serosanguineous drainage noted. The wound margin is thickened. There is large (67-100%) red, pink granulation within the wound bed. There is no necrotic tissue within the wound bed. Wound #9 status is Open. Original cause of wound was Blister. The wound is located on the Right,Medial Foot. The wound measures 3.4cm length x 2.8cm width x 0.2cm depth; 7.477cm^2 area and 1.495cm^3 volume. There is Fat Layer (Subcutaneous Tissue) Exposed exposed. There is no tunneling or undermining noted. There is a large amount of serosanguineous drainage noted. The wound margin is distinct with the outline attached to the wound base. There is large (67- 100%) red granulation within the wound bed. There is no necrotic tissue within the wound bed. 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 There was a Excisional Skin/Subcutaneous Tissue Debridement with a total area of 1.92 sq cm performed by [name], MD. With the  following instrument(s): Curette to remove Viable and Non-Viable tissue/material. Material removed includes Callus and Subcutaneous Tissue and. No specimens were taken. A time out was conducted at 14:00, prior to the start of the procedure. A Moderate amount of bleeding was controlled with Silver Nitrate. The procedure was tolerated well with a pain level of 0 throughout and a pain level of 0 following the procedure. Post Debridement Measurements: 1.2cm length x 1.6cm width x 1cm depth; 1.508cm^3 volume. Character of Wound/Ulcer Post Debridement is improved. Severity of Tissue Post Debridement is: Fat layer exposed. Post procedure Diagnosis Wound #10: Same as Pre-Procedure Pre-procedure diagnosis of Wound #10 is a Diabetic Wound/Ulcer of the Lower Extremity located on the Left,Lateral,Plantar Foot . There was a T Contact otal Cast Procedure by [name], MD. Post procedure Diagnosis Wound #10: Same as Pre-Procedure Plan Follow-up Appointments: Return Appointment in 1 week. - MD visit Return Appointment in: - Friday PM for cast change Dressing Change Frequency: Wound #10 Left,Lateral,Plantar Foot: Do not change entire dressing for one week. Wound #9 Right,Medial Foot: Change Dressing every other day. Wound Cleansing: May shower with protection. Primary Wound Dressing: Wound #10 Left,Lateral,Plantar Foot: Calcium Alginate with Silver Wound #9 Right,Medial Foot: Polymem Silver Secondary Dressing: Wound #10 Left,Lateral,Plantar Foot: Foam Kerlix/Rolled Gauze Dry Gauze Drawtex Wound #9 Right,Medial Foot: Kerlix/Rolled Gauze ABD pad Zetuvit or Kerramax Off-Loading: T Contact Cast to Left Lower Extremity otal Open toe surgical shoe to: - foot 1. Continue with silver alginate to the left foot but put the total contact cast here 2. Polymen Ag under a gauze. Offloading sandal Electronic Signature(s) Signed: 06/03/2019 4:51:55 PM By: Baltazar Najjar MD Entered By: Baltazar Najjar on 06/03/2019 14:08:04 -------------------------------------------------------------------------------- Total Contact Cast Details Patient Name: Date of Service: [name], Collette J. 06/03/2019 12:45 PM Medical Record Number: [id number] Patient Account Number: [id number] Date of Birth/Sex: Treating RN: [birth year] (64 y.o. [name] Primary Care Provider: MA SO UDI, Judene Companion MA LSA DA T Other Clinician: Referring Provider: Treating Provider/Extender: Baltazar Najjar MA SO UDI, ELHA MA LSA DA T Weeks in Treatment: 45 T Contact Cast Applied for Wound Assessment: otal Wound #10 Left,Lateral,Plantar Foot Performed By: Physician [name], MD Post Procedure Diagnosis Same as Pre-procedure Electronic Signature(s) Signed: 06/03/2019 4:51:55 PM By: Baltazar Najjar MD Signed: 06/03/2019 5:54:52 PM By: Zandra Abts RN, BSN Entered By: Zandra Abts on 06/03/2019 14:04:24 -------------------------------------------------------------------------------- SuperBill Details Patient Name: Date of Service: [name], Mattelyn J. 06/03/2019 Medical Record Number: [id number] Patient Account Number: [id number] Date of Birth/Sex: Treating RN: Jun 29, 1955 (64 y.o. [name] Primary Care Provider: MA Hedy Jacob MA LSA DA T Other Clinician: Referring Provider: Treating Provider/Extender: Baltazar Najjar MA SO UDI, ELHA MA LSA DA T Weeks in Treatment:  18 Diagnosis Coding ICD-10 Codes Code Description E11.621 Type 2 diabetes mellitus with foot ulcer L97.524 Non-pressure chronic ulcer of other part of left foot with necrosis of bone L97.511 Non-pressure chronic ulcer of other part of right foot limited to breakdown of skin L97.514 Non-pressure chronic ulcer of other part of right foot with necrosis of bone E11.42 Type 2 diabetes mellitus with diabetic polyneuropathy M14.671 Charcot's joint, right ankle and foot M14.672 Charcot's joint, left ankle and foot M86.672 Other chronic  osteomyelitis, left ankle and foot Facility Procedures CPT4 Code: [id number] Description: 11042 - DEB SUBQ TISSUE 20 SQ CM/< ICD-10 Diagnosis Description L97.524 Non-pressure chronic ulcer of other part of left foot with necrosis of bone Modifier: Quantity: 1 Physician Procedures : CPT4 Code Description Modifier [id number] 11042 - WC PHYS SUBQ TISS 20 SQ CM ICD-10 Diagnosis Description L97.524 Non-pressure chronic ulcer of other part of left foot with necrosis of bone Quantity: 1 Electronic Signature(s) Signed: 06/03/2019 4:51:55 PM By: Baltazar Najjar MD Entered By: Baltazar Najjar on 06/03/2019 14:08:17

## 2019-06-04 NOTE — Progress Notes (Signed)
IKEYA, BROCKEL ([id number]) Visit Report for 05/31/2019 Arrival Information Details Patient Name: Date of Service: [name], [name] 05/31/2019 3:15 PM Medical Record Number: [id number] Patient Account Number: [id number] Date of Birth/Sex: Treating RN: 08-29-1955 (64 y.o. F) Epps, Sawyer Primary Care Travanti Mcmanus: MA SO UDI, [place] MA LSA DA T Other Clinician: Referring Raeqwon Lux: Treating Zyanya Glaza/Extender: Linton Ham MA SO UDI, ELHA MA LSA DA T Weeks in Treatment: 6 Visit Information History Since Last Visit All ordered tests and consults were completed: No Patient Arrived: Crutches Added or deleted any medications: No Arrival Time: 15:22 Any new allergies or adverse reactions: No Accompanied By: self Had a fall or experienced change in No Transfer Assistance: None activities of daily living that may affect Patient Identification Verified: Yes risk of falls: Secondary Verification Process Completed: Yes Signs or symptoms of abuse/neglect since last visito No Patient Requires Transmission-Based Precautions: No Hospitalized since last visit: No Patient Has Alerts: No Implantable device outside of the clinic excluding No cellular tissue based products placed in the center since last visit: Has Dressing in Place as Prescribed: Yes Has Compression in Place as Prescribed: Yes Pain Present Now: No Electronic Signature(s) Signed: 06/04/2019 5:13:38 PM By: Carlene Coria RN Entered By: Carlene Coria on 05/31/2019 15:54:51 -------------------------------------------------------------------------------- Encounter Discharge Information Details Patient Name: Date of Service: [facility], [name] 05/31/2019 3:15 PM Medical Record Number: [id number] Patient Account Number: [id number] Date of Birth/Sex: Treating RN: October 21, 1955 (64 y.o. Clearnce Sorrel Primary Care Tramond Slinker: MA SO UDI, Willette Pa MA LSA DA T Other Clinician: Referring Andera Cranmer: Treating Santasia Rew/Extender: Linton Ham MA SO UDI, ELHA MA LSA DA T Weeks in Treatment: 36 Encounter Discharge Information Items Discharge Condition: Stable Ambulatory Status: Crutches Discharge Destination: Home Transportation: Private Auto Accompanied By: self Schedule Follow-up Appointment: Yes Clinical Summary of Care: Patient Declined Electronic Signature(s) Signed: 05/31/2019 4:51:42 PM By: Kela Millin Entered By: Kela Millin on 05/31/2019 16:04:15 -------------------------------------------------------------------------------- Lower Extremity Assessment Details Patient Name: Date of Service: [facility]. 05/31/2019 3:15 PM Medical Record Number: [id number] Patient Account Number: [id number] Date of Birth/Sex: Treating RN: [birth year] (64 y.o. F) Kela Millin Primary Care Bary Limbach: MA SO UDI, [place] MA LSA DA T Other Clinician: Referring Soley Harriss: Treating Okie Jansson/Extender: Linton Ham MA SO UDI, ELHA MA LSA DA T Weeks in Treatment: 17 Edema Assessment Assessed: [Left: No] [Right: No] Edema: [Left: Yes] [Right: Yes] Calf Left: Right: Point of Measurement: 43 cm From Medial Instep 36 cm 37 cm Ankle Left: Right: Point of Measurement: 12 cm From Medial Instep 24 cm 26.5 cm Vascular Assessment Pulses: Dorsalis Pedis Palpable: [Right:Yes] Electronic Signature(s) Signed: 05/31/2019 4:51:42 PM By: Kela Millin Signed: 06/04/2019 5:13:38 PM By: Carlene Coria RN Entered By: Carlene Coria on 05/31/2019 15:55:08 -------------------------------------------------------------------------------- Multi Wound Chart Details Patient Name: Date of Service: [facility], Kanijah J. 05/31/2019 3:15 PM Medical Record Number: [id number] Patient Account Number: [id number] Date of Birth/Sex: Treating RN: [birth year] (64 y.o. F) Primary Care Jabe Jeanbaptiste: MA SO UDI, Willette Pa MA LSA DA T Other Clinician: Referring Lewayne Pauley: Treating Axle Parfait/Extender: Linton Ham MA SO UDI, ELHA MA LSA DA T Weeks  in Treatment: 17 Vital Signs Height(in): 68 Pulse(bpm): 75 Weight(lbs): 265 Blood Pressure(mmHg): 144/76 Body Mass Index(BMI): 40 Temperature(F): 98.2 Respiratory Rate(breaths/min): 18 Photos: [10:No Photos Left, Lateral, Plantar Foot] [9:No Photos Right, Medial Foot] [N/A:N/A N/A] Wound Location: [10:Blister] [9:Blister] [N/A:N/A] Wounding Event: [10:Diabetic Wound/Ulcer of the Lower] [9:Diabetic Wound/Ulcer of the Lower] [N/A:N/A] Primary Etiology: [10:Extremity Glaucoma, Chronic sinus] [9:Extremity Glaucoma, Chronic sinus] [N/A:N/A] Comorbid History: [  10:problems/congestion, Anemia, Sleep Apnea, Hypertension, Type II Diabetes, Rheumatoid Arthritis, Neuropathy 11/18/2018] [9:problems/congestion, Anemia, Sleep Apnea, Hypertension, Type II Diabetes, Rheumatoid Arthritis, Neuropathy  06/18/2018] [N/A:N/A] Date Acquired: [10:17] [9:17] [N/A:N/A] Weeks of Treatment: [10:Open] [9:Open] [N/A:N/A] Wound Status: [10:1.2x1.3x1.2] [9:3.7x2.5x0.5] [N/A:N/A] Measurements L x W x D (cm) [10:1.225] [9:7.265] [N/A:N/A] A (cm) : rea [10:1.47] [9:3.632] [N/A:N/A] Volume (cm) : [10:30.40%] [9:33.90%] [N/A:N/A] % Reduction in A rea: [10:44.30%] [9:63.30%] [N/A:N/A] % Reduction in Volume: [10:Grade 2] [9:Grade 2] [N/A:N/A] Classification: [10:Medium] [9:Large] [N/A:N/A] Exudate A mount: [10:Serosanguineous] [9:Serosanguineous] [N/A:N/A] Exudate Type: [10:red, brown] [9:red, brown] [N/A:N/A] Exudate Color: [10:Thickened] [9:Distinct, outline attached] [N/A:N/A] Wound Margin: [10:Large (67-100%)] [9:Large (67-100%)] [N/A:N/A] Granulation A mount: [10:Red, Pink] [9:Red] [N/A:N/A] Granulation Quality: [10:None Present (0%)] [9:None Present (0%)] [N/A:N/A] Necrotic A mount: [10:Fat Layer (Subcutaneous Tissue)] [9:Fat Layer (Subcutaneous Tissue)] [N/A:N/A] Exposed Structures: [10:Exposed: Yes Muscle: Yes Fascia: No Tendon: No Joint: No Bone: No None] [9:Exposed: Yes Fascia: No Tendon: No Muscle: No Joint:  No Bone: No Small (1-33%)] [N/A:N/A] Epithelialization: [10:N/A] [9:T Contact Cast otal] [N/A:N/A] Treatment Notes Wound #9 (Right, Medial Foot) 1. Cleanse With Wound Cleanser Soap and water 3. Primary Dressing Applied Hydrofera Blue 4. Secondary Dressing ABD Pad Kerramax/Xtrasorb Other secondary dressing (specify in notes) Drawtex 7. Footwear/Offloading device applied T Contact Cast otal Notes drawtex. TCC applied by MD. Electronic Signature(s) Signed: 06/01/2019 8:06:24 AM By: Baltazar Najjar MD Entered By: Baltazar Najjar on 05/31/2019 16:08:38 -------------------------------------------------------------------------------- Multi-Disciplinary Care Plan Details Patient Name: Date of Service: [facility], Nakeia J. 05/31/2019 3:15 PM Medical Record Number: [id number] Patient Account Number: [id number] Date of Birth/Sex: Treating RN: Aug 21, 1955 (64 y.o. F) [name] Primary Care Erland Vivas: MA SO UDI, [place] MA LSA DA T Other Clinician: Referring Nafeesah Lapaglia: Treating Sienna Stonehocker/Extender: Baltazar Najjar MA SO UDI, ELHA MA LSA DA T Weeks in Treatment: 17 Active Inactive Wound/Skin Impairment Nursing Diagnoses: Impaired tissue integrity Knowledge deficit related to ulceration/compromised skin integrity Goals: Patient/caregiver will verbalize understanding of skin care regimen Date Initiated: 01/28/2019 Target Resolution Date: 06/14/2019 Goal Status: Active Interventions: Assess patient/caregiver ability to obtain necessary supplies Assess patient/caregiver ability to perform ulcer/skin care regimen upon admission and as needed Assess ulceration(s) every visit Provide education on ulcer and skin care Notes: Electronic Signature(s) Signed: 05/31/2019 4:51:42 PM By: [name] Entered By: [name] on 05/31/2019 15:33:01 -------------------------------------------------------------------------------- Pain Assessment Details Patient Name: Date of  Service: [facility], Melda J. 05/31/2019 3:15 PM Medical Record Number: [id number] Patient Account Number: [id number] Date of Birth/Sex: Treating RN: [birth year] (64 y.o. [name] Primary Care [place] Schorr: MA SO UDI, [place] MA LSA DA T Other Clinician: Referring Calel Pisarski: Treating Evalyse Stroope/Extender: Baltazar Najjar MA SO UDI, ELHA MA LSA DA T Weeks in Treatment: 17 Active Problems Location of Pain Severity and Description of Pain Patient Has Paino No Site Locations Pain Management and Medication Current Pain Management: Electronic Signature(s) Signed: 06/04/2019 5:13:38 PM By: Yevonne Pax RN Entered By: Yevonne Pax on 05/31/2019 15:55:02 -------------------------------------------------------------------------------- Patient/Caregiver Education Details Patient Name: Date of Service: Gypsy Decant 5/14/2021andnbsp3:15 PM Medical Record Number: [id number] Patient Account Number: [id number] Date of Birth/Gender: Treating RN: 08/19/1955 (64 y.o. [name] Primary Care Physician: MA SO UDI, Judene Companion MA LSA DA T Other Clinician: Referring Physician: Treating Physician/Extender: Baltazar Najjar MA SO UDI, ELHA MA LSA DA T Weeks in Treatment: 52 Education Assessment Education Provided To: Patient Education Topics Provided Wound/Skin Impairment: Handouts: Caring for Your Ulcer Methods: Explain/Verbal Responses: State content correctly Electronic Signature(s) Signed: 05/31/2019 4:51:42 PM By: [name] Entered By:  [name] on 05/31/2019 15:30:15 -------------------------------------------------------------------------------- Wound Assessment Details Patient Name: Date of Service: SHERLE, MELLO 05/31/2019 3:15 PM Medical Record Number: [id number] Patient Account Number: [id number] Date of Birth/Sex: Treating RN: April 15, 1955 (64 y.o. F) Dwiggins, Shannon Primary Care Pau Banh: MA SO UDI, [place] MA LSA DA T Other Clinician: Referring  Ayanah Snader: Treating Marki Frede/Extender: Baltazar Najjar MA SO UDI, ELHA MA LSA DA T Weeks in Treatment: 17 Wound Status Wound Number: 10 Primary Diabetic Wound/Ulcer of the Lower Extremity Etiology: Wound Location: Left, Lateral, Plantar Foot Wound Open Wounding Event: Blister Status: Date Acquired: 11/18/2018 Comorbid Glaucoma, Chronic sinus problems/congestion, Anemia, Sleep Weeks Of Treatment: 17 History: Apnea, Hypertension, Type II Diabetes, Rheumatoid Arthritis, Clustered Wound: No Neuropathy Wound Measurements Length: (cm) 1.2 Width: (cm) 1.3 Depth: (cm) 1.2 Area: (cm) 1.225 Volume: (cm) 1.47 % Reduction in Area: 30.4% % Reduction in Volume: 44.3% Epithelialization: None Tunneling: No Undermining: No Wound Description Classification: Grade 2 Wound Margin: Thickened Exudate Amount: Medium Exudate Type: Serosanguineous Exudate Color: red, brown Foul Odor After Cleansing: No Slough/Fibrino No Wound Bed Granulation Amount: Large (67-100%) Exposed Structure Granulation Quality: Red, Pink Fascia Exposed: No Necrotic Amount: None Present (0%) Fat Layer (Subcutaneous Tissue) Exposed: Yes Tendon Exposed: No Muscle Exposed: Yes Necrosis of Muscle: No Joint Exposed: No Bone Exposed: No Electronic Signature(s) Signed: 05/31/2019 4:51:42 PM By: [name] Entered By: [name] on 05/31/2019 15:53:59 -------------------------------------------------------------------------------- Wound Assessment Details Patient Name: Date of Service: [facility], Ziyon J. 05/31/2019 3:15 PM Medical Record Number: [id number] Patient Account Number: [id number] Date of Birth/Sex: Treating RN: 03-Jul-1955 (64 y.o. F) Dwiggins, Shannon Primary Care Joey Lierman: MA SO UDI, [place] MA LSA DA T Other Clinician: Referring Rawn Quiroa: Treating Gailen Venne/Extender: Baltazar Najjar MA SO UDI, ELHA MA LSA DA T Weeks in Treatment: 17 Wound Status Wound Number: 9 Primary Diabetic Wound/Ulcer  of the Lower Extremity Etiology: Wound Location: Right, Medial Foot Wound Open Wounding Event: Blister Status: Date Acquired: 06/18/2018 Comorbid Glaucoma, Chronic sinus problems/congestion, Anemia, Sleep Weeks Of Treatment: 17 History: Apnea, Hypertension, Type II Diabetes, Rheumatoid Arthritis, Clustered Wound: No Neuropathy Wound Measurements Length: (cm) 3.7 Width: (cm) 2.5 Depth: (cm) 0.5 Area: (cm) 7.265 Volume: (cm) 3.632 % Reduction in Area: 33.9% % Reduction in Volume: 63.3% Epithelialization: Small (1-33%) Tunneling: No Undermining: No Wound Description Classification: Grade 2 Wound Margin: Distinct, outline attached Exudate Amount: Large Exudate Type: Serosanguineous Exudate Color: red, brown Foul Odor After Cleansing: No Slough/Fibrino No Wound Bed Granulation Amount: Large (67-100%) Exposed Structure Granulation Quality: Red Fascia Exposed: No Necrotic Amount: None Present (0%) Fat Layer (Subcutaneous Tissue) Exposed: Yes Tendon Exposed: No Muscle Exposed: No Joint Exposed: No Bone Exposed: No Electronic Signature(s) Signed: 05/31/2019 4:51:42 PM By: [name] Entered By: [name] on 05/31/2019 15:54:13 -------------------------------------------------------------------------------- Vitals Details Patient Name: Date of Service: [name], Corianna J. 05/31/2019 3:15 PM Medical Record Number: [id number] Patient Account Number: [id number] Date of Birth/Sex: Treating RN: 1955/09/29 (64 y.o. Sharyne Peach, Carrie Primary Care Keltie Labell: MA SO UDI, [place] MA LSA DA T Other Clinician: Referring Kvon Mcilhenny: Treating Lynwood Kubisiak/Extender: Baltazar Najjar MA SO UDI, ELHA MA LSA DA T Weeks in Treatment: 17 Vital Signs Time Taken: 15:23 Temperature (F): 98.2 Height (in): 68 Pulse (bpm): 75 Weight (lbs): 265 Respiratory Rate (breaths/min): 18 Body Mass Index (BMI): 40.3 Blood Pressure (mmHg): 144/76 Reference Range: 80 - 120 mg / dl Electronic  Signature(s) Signed: 06/04/2019 5:13:38 PM By: Yevonne Pax RN Entered By: Yevonne Pax on 05/31/2019 15:54:57

## 2019-06-06 ENCOUNTER — Ambulatory Visit (INDEPENDENT_AMBULATORY_CARE_PROVIDER_SITE_OTHER): Payer: Medicare Other | Admitting: Internal Medicine

## 2019-06-06 ENCOUNTER — Encounter: Payer: Self-pay | Admitting: Internal Medicine

## 2019-06-06 ENCOUNTER — Encounter (HOSPITAL_BASED_OUTPATIENT_CLINIC_OR_DEPARTMENT_OTHER): Payer: Medicare Other | Admitting: Internal Medicine

## 2019-06-06 VITALS — BP 118/88 | HR 81 | Temp 98.2°F | Ht 69.0 in | Wt 260.8 lb

## 2019-06-06 DIAGNOSIS — E1169 Type 2 diabetes mellitus with other specified complication: Secondary | ICD-10-CM

## 2019-06-06 DIAGNOSIS — M86372 Chronic multifocal osteomyelitis, left ankle and foot: Secondary | ICD-10-CM | POA: Diagnosis present

## 2019-06-06 DIAGNOSIS — I10 Essential (primary) hypertension: Secondary | ICD-10-CM | POA: Diagnosis not present

## 2019-06-06 LAB — GLUCOSE, CAPILLARY: Glucose-Capillary: 138 mg/dL — ABNORMAL HIGH (ref 70–99)

## 2019-06-06 LAB — POCT GLYCOSYLATED HEMOGLOBIN (HGB A1C): Hemoglobin A1C: 6 % — AB (ref 4.0–5.6)

## 2019-06-06 NOTE — Patient Instructions (Signed)
It was our pleasure taking care of you in our clinic today.  You were seen for follow up of foot infection. I am glad that you are doing better. Continue taking antibiotic and follow up your wound care and infectious disease doctor as scheduled.  I send you to lab for some blood work. Please take your medications as instructed and contact us if you have any question or concern.  Please come back to clinic in 3 months or earlier if your symptoms get worse or not improved. As always, if having severe symptoms, please seek medical attention at emergency room.  Thank you

## 2019-06-06 NOTE — Progress Notes (Signed)
   CC: Osteomyelitis follow-up  HPI:  [name] is a 64 y.o. female with PMHx as documented below, presented for follow-up of osteomyelitis. Please refer to problem based charting for further details and assessment and plan of current problem and chronic medical conditions.   Past Medical History:  Diagnosis Date  . Chronic normocytic anemia   . CKD (chronic kidney disease) stage 3, GFR 30-59 ml/min 02/02/2013  . Diabetes mellitus type II, controlled (HCC) 06/17/2009  . Essential hypertension 06/17/2009  . GERD (gastroesophageal reflux disease)   . Glaucoma   . H/O hiatal hernia   . Hyperlipidemia   . MRSA (methicillin resistant Staphylococcus aureus)   . Obstructive sleep apnea 05/26/2010   Review of Systems:   Constitutional: Negative for chills and fever.  Respiratory: Negative for shortness of breath.   Cardiovascular: Negative for chest pain and leg swelling.  Gastrointestinal: Negative for abdominal pain, nausea and vomiting.   Physical Exam:  Vitals:   06/06/19 1358  BP: 118/88  Pulse: 81  Temp: 98.2 F (36.8 C)  TempSrc: Oral  SpO2: 100%  Weight: 260 lb 12.8 oz (118.3 kg)  Height: 5\' 9"  (1.753 m)   Constitutional: Pleasant lady. In no acute distress.  Cardiovascular:  RRR, nl S1S2, no murmur Respiratory: Effort normal and breath sounds normal. No respiratory distress. No wheezes.  GI: Soft. Bowel sounds are normal. No distension. There is no tenderness.  Neurological: Is alert and oriented x 3  Skin: Not diaphoretic. No erythema.  Psychiatric: Normal mood and affect. Behavior is normal. Judgment and thought content normal.  MSK: Wearing boots, lower extremities are wrapped   Assessment & Plan:    See Encounters Tab for problem based charting.  Patient discussed with Dr. 

## 2019-06-07 ENCOUNTER — Encounter: Payer: Self-pay | Admitting: Internal Medicine

## 2019-06-07 ENCOUNTER — Encounter (HOSPITAL_BASED_OUTPATIENT_CLINIC_OR_DEPARTMENT_OTHER): Payer: Medicare Other | Admitting: Internal Medicine

## 2019-06-07 DIAGNOSIS — E11621 Type 2 diabetes mellitus with foot ulcer: Secondary | ICD-10-CM | POA: Diagnosis not present

## 2019-06-07 LAB — SEDIMENTATION RATE: Sed Rate: 76 mm/hr — ABNORMAL HIGH (ref 0–40)

## 2019-06-07 LAB — C-REACTIVE PROTEIN: CRP: 13 mg/L — ABNORMAL HIGH (ref 0–10)

## 2019-06-07 NOTE — Progress Notes (Signed)
Internal Medicine Clinic Attending  Case discussed with Dr. Myrtie Hawk at the time of the visit.  We reviewed the resident's history and exam and pertinent patient test results.  I agree with the assessment, diagnosis, and plan of care documented in the resident's note.  ESR remains elevated, but clinically improving and CRP continues to come down. Seems to be responding well to antibiotics, but will see Dr. Megan Salon in a week for reassessment and determination of need for additional oral antibiotics. A1C looks good, but could be falsely lowered by inflammation leading to increased RBC turnover.  Lenice Pressman, M.D., Ph.D.

## 2019-06-07 NOTE — Assessment & Plan Note (Signed)
BP well controlled at 118/88, pulse rate 81 today. Last BMP with a stable creatinine 1.22 and normal electrolytes. -Continue current medications

## 2019-06-07 NOTE — Assessment & Plan Note (Signed)
CBG 138, hemoglobin A1c 6. -Continue Janumet

## 2019-06-07 NOTE — Assessment & Plan Note (Signed)
Patient received 8 weeks of IV ceftriaxone (through PICC line at home) and p.o. metronidazole for left foot osteomyelitis. Is currently on p.o. cephalexin and metronidazole (started 5/3). Adherent to p.o. antibiotic. No fever or chills.  She reports improvement. No fever, no chills. She had an appointment with wound care team tomorrow and with Dr. Megan Salon, ID next week. -Continue p.o. metronidazole and cephalexin -Checking ESR and CRP -Follow-up with wound care tomorrow and with ID next week to reevaluate and decide about duration of p.o. antibiotic.

## 2019-06-10 ENCOUNTER — Encounter (HOSPITAL_BASED_OUTPATIENT_CLINIC_OR_DEPARTMENT_OTHER): Payer: Medicare Other | Admitting: Internal Medicine

## 2019-06-10 DIAGNOSIS — E11621 Type 2 diabetes mellitus with foot ulcer: Secondary | ICD-10-CM | POA: Diagnosis not present

## 2019-06-10 NOTE — Progress Notes (Signed)
[name], [name] ([id number]) Visit Report for 06/07/2019 Arrival Information Details Patient Name: Date of Service: [name] 06/07/2019 1:00 PM Medical Record Number: [id number] Patient Account Number: [id number] Date of Birth/Sex: Treating RN: 1955-10-10 (64 y.o. [name] Primary Care Lonzo Saulter: MA SO UDI, [place] MA LSA DA T Other Clinician: Referring Sasan Wilkie: Treating Nerida Boivin/Extender: Baltazar Najjar MA SO UDI, ELHA MA LSA DA T Weeks in Treatment: 18 Visit Information History Since Last Visit Added or deleted any medications: No Patient Arrived: Crutches Any new allergies or adverse reactions: No Arrival Time: 13:14 Had a fall or experienced change in No Accompanied By: alone activities of daily living that may affect Transfer Assistance: None risk of falls: Patient Identification Verified: Yes Signs or symptoms of abuse/neglect since last visito No Secondary Verification Process Completed: Yes Hospitalized since last visit: No Patient Requires Transmission-Based Precautions: No Implantable device outside of the clinic excluding No Patient Has Alerts: No cellular tissue based products placed in the center since last visit: Has Dressing in Place as Prescribed: Yes Has Footwear/Offloading in Place as Prescribed: Yes Left: T Contact Cast otal Pain Present Now: No Electronic Signature(s) Signed: 06/10/2019 5:12:22 PM By: Zandra Abts RN, BSN Entered By: Zandra Abts on 06/07/2019 13:15:55 -------------------------------------------------------------------------------- Multi Wound Chart Details Patient Name: Date of Service: [name], [name] 06/07/2019 1:00 PM Medical Record Number: [id number] Patient Account Number: [id number] Date of Birth/Sex: Treating RN: 10-15-1955 (64 y.o. F) Dwiggins, Shannon Primary Care Marget Outten: MA SO UDI, [place] MA LSA DA T Other Clinician: Referring Valinda Fedie: Treating Avonelle Viveros/Extender: Baltazar Najjar MA SO UDI, ELHA MA  LSA DA T Weeks in Treatment: 18 Vital Signs Height(in): 68 Capillary Blood Glucose(mg/dl): 740 Weight(lbs): 814 Pulse(bpm): 79 Body Mass Index(BMI): 40 Blood Pressure(mmHg): 125/59 Temperature(F): 98.3 Respiratory Rate(breaths/min): 18 Photos: [10:No Photos Left, Lateral, Plantar Foot] [9:No Photos Right, Medial Foot] [N/A:N/A N/A] Wound Location: [10:Blister] [9:Blister] [N/A:N/A] Wounding Event: [10:Diabetic Wound/Ulcer of the Lower] [9:Diabetic Wound/Ulcer of the Lower] [N/A:N/A] Primary Etiology: [10:Extremity Glaucoma, Chronic sinus] [9:Extremity Glaucoma, Chronic sinus] [N/A:N/A] Comorbid History: [10:problems/congestion, Anemia, Sleep Apnea, Hypertension, Type II Diabetes, Rheumatoid Arthritis, Neuropathy 11/18/2018] [9:problems/congestion, Anemia, Sleep Apnea, Hypertension, Type II Diabetes, Rheumatoid Arthritis, Neuropathy  06/18/2018] [N/A:N/A] Date Acquired: [10:18] [9:18] [N/A:N/A] Weeks of Treatment: [10:Open] [9:Open] [N/A:N/A] Wound Status: [10:1.2x1.6x1] [9:3.4x2.8x0.2] [N/A:N/A] Measurements L x W x D (cm) [10:1.508] [9:7.477] [N/A:N/A] A (cm) : rea [10:1.508] [9:1.495] [N/A:N/A] Volume (cm) : [10:14.30%] [9:32.00%] [N/A:N/A] % Reduction in A rea: [10:42.90%] [9:84.90%] [N/A:N/A] % Reduction in Volume: [10:9] Starting Position 1 (o'clock): [10:10] Ending Position 1 (o'clock): [10:1.3] Maximum Distance 1 (cm): [10:Yes] [9:No] [N/A:N/A] Undermining: [10:Grade 2] [9:Grade 2] [N/A:N/A] Classification: [10:Medium] [9:Large] [N/A:N/A] Exudate A mount: [10:Serosanguineous] [9:Serosanguineous] [N/A:N/A] Exudate Type: [10:red, brown] [9:red, brown] [N/A:N/A] Exudate Color: [10:Thickened] [9:Distinct, outline attached] [N/A:N/A] Wound Margin: [10:Large (67-100%)] [9:Large (67-100%)] [N/A:N/A] Granulation A mount: [10:Red, Pink] [9:Red] [N/A:N/A] Granulation Quality: [10:None Present (0%)] [9:None Present (0%)] [N/A:N/A] Necrotic A mount: [10:Fat Layer (Subcutaneous  Tissue)] [9:Fat Layer (Subcutaneous Tissue)] [N/A:N/A] Exposed Structures: [10:Exposed: Yes Muscle: Yes Fascia: No Tendon: No Joint: No Bone: No None] [9:Exposed: Yes Fascia: No Tendon: No Muscle: No Joint: No Bone: No Small (1-33%)] [N/A:N/A] Epithelialization: [10:T Contact Cast otal] [9:N/A] [N/A:N/A] Treatment Notes Electronic Signature(s) Signed: 06/07/2019 5:37:38 PM By: [name] Signed: 06/07/2019 6:00:23 PM By: Baltazar Najjar MD Entered By: Baltazar Najjar on 06/07/2019 14:15:43 -------------------------------------------------------------------------------- Multi-Disciplinary Care Plan Details Patient Name: Date of Service: [facility], Tyleigh J. 06/07/2019 1:00 PM Medical Record Number: [id number] Patient Account Number: [id number]  Date of Birth/Sex: Treating RN: 01-23-1955 (64 y.o. F) [name] Primary Care Kiaira Pointer: MA SO UDI, [place] MA LSA DA T Other Clinician: Referring Sahithi Ordoyne: Treating Jarvin Ogren/Extender: Baltazar Najjar MA SO UDI, ELHA MA LSA DA T Weeks in Treatment: 86 Active Inactive Wound/Skin Impairment Nursing Diagnoses: Impaired tissue integrity Knowledge deficit related to ulceration/compromised skin integrity Goals: Patient/caregiver will verbalize understanding of skin care regimen Date Initiated: 01/28/2019 Target Resolution Date: 06/14/2019 Goal Status: Active Interventions: Assess patient/caregiver ability to obtain necessary supplies Assess patient/caregiver ability to perform ulcer/skin care regimen upon admission and as needed Assess ulceration(s) every visit Provide education on ulcer and skin care Notes: Electronic Signature(s) Signed: 06/07/2019 5:37:38 PM By: [name] Entered By: [name] on 06/07/2019 12:54:32 -------------------------------------------------------------------------------- Pain Assessment Details Patient Name: Date of Service: [name], [name] 06/07/2019 1:00 PM Medical Record Number:  [id number] Patient Account Number: [id number] Date of Birth/Sex: Treating RN: [birth year] (64 y.o. [name] Primary Care Aksh Swart: MA SO UDI, [place] MA LSA DA T Other Clinician: Referring Azyah Flett: Treating Pearson Picou/Extender: Baltazar Najjar MA SO UDI, ELHA MA LSA DA T Weeks in Treatment: 18 Active Problems Location of Pain Severity and Description of Pain Patient Has Paino No Site Locations Pain Management and Medication Current Pain Management: Electronic Signature(s) Signed: 06/10/2019 5:12:22 PM By: Zandra Abts RN, BSN Entered By: Zandra Abts on 06/07/2019 13:16:09 -------------------------------------------------------------------------------- Patient/Caregiver Education Details Patient Name: Date of Service: Sarah Dietrich Pates 5/21/2021andnbsp1:00 PM Medical Record Number: [id number] Patient Account Number: [id number] Date of Birth/Gender: Treating RN: 03-May-1955 (64 y.o. [name] Primary Care Physician: MA SO UDI, Judene Companion MA LSA DA T Other Clinician: Referring Physician: Treating Physician/Extender: Baltazar Najjar MA SO UDI, ELHA MA LSA DA T Weeks in Treatment: 45 Education Assessment Education Provided To: Patient Education Topics Provided Wound/Skin Impairment: Handouts: Caring for Your Ulcer Methods: Explain/Verbal Responses: State content correctly Electronic Signature(s) Signed: 06/07/2019 5:37:38 PM By: [name] Entered By: [name] on 06/07/2019 12:54:49 -------------------------------------------------------------------------------- Wound Assessment Details Patient Name: Date of Service: [name], [name] 06/07/2019 1:00 PM Medical Record Number: [id number] Patient Account Number: [id number] Date of Birth/Sex: Treating RN: [birth year] (64 y.o. [name] Primary Care Alfard Cochrane: MA SO UDI, [place] MA LSA DA T Other Clinician: Referring Alycea Segoviano: Treating Adeleine Pask/Extender: Baltazar Najjar MA SO UDI, ELHA MA  LSA DA T Weeks in Treatment: 18 Wound Status Wound Number: 10 Primary Diabetic Wound/Ulcer of the Lower Extremity Etiology: Wound Location: Left, Lateral, Plantar Foot Wound Open Wounding Event: Blister Status: Date Acquired: 11/18/2018 Comorbid Glaucoma, Chronic sinus problems/congestion, Anemia, Sleep Weeks Of Treatment: 18 History: Apnea, Hypertension, Type II Diabetes, Rheumatoid Arthritis, Clustered Wound: No Neuropathy Wound Measurements Length: (cm) 1.2 Width: (cm) 1.6 Depth: (cm) 1 Area: (cm) 1.508 Volume: (cm) 1.508 % Reduction in Area: 14.3% % Reduction in Volume: 42.9% Epithelialization: None Tunneling: No Undermining: Yes Starting Position (o'clock): 9 Ending Position (o'clock): 10 Maximum Distance: (cm) 1.3 Wound Description Classification: Grade 2 Wound Margin: Thickened Exudate Amount: Medium Exudate Type: Serosanguineous Exudate Color: red, brown Foul Odor After Cleansing: No Slough/Fibrino No Wound Bed Granulation Amount: Large (67-100%) Exposed Structure Granulation Quality: Red, Pink Fascia Exposed: No Necrotic Amount: None Present (0%) Fat Layer (Subcutaneous Tissue) Exposed: Yes Tendon Exposed: No Muscle Exposed: Yes Necrosis of Muscle: No Joint Exposed: No Bone Exposed: No Electronic Signature(s) Signed: 06/10/2019 5:12:22 PM By: Zandra Abts RN, BSN Entered By: Zandra Abts on 06/07/2019 14:01:07 -------------------------------------------------------------------------------- Wound Assessment Details Patient Name: Date of Service: [name], Kalii J. 06/07/2019 1:00  PM Medical Record Number: [id number] Patient Account Number: [id number] Date of Birth/Sex: Treating RN: 10/31/1955 (64 y.o. [name] Primary Care Rasaan Brotherton: MA SO UDI, [place] MA LSA DA T Other Clinician: Referring Pate Aylward: Treating Kristapher [place]/Extender: Linton Ham MA SO UDI, ELHA MA LSA DA T Weeks in Treatment: 18 Wound Status Wound Number: 9 Primary  Diabetic Wound/Ulcer of the Lower Extremity Etiology: Wound Location: Right, Medial Foot Wound Open Wounding Event: Blister Status: Date Acquired: 06/18/2018 Comorbid Glaucoma, Chronic sinus problems/congestion, Anemia, Sleep Weeks Of Treatment: 18 History: Apnea, Hypertension, Type II Diabetes, Rheumatoid Arthritis, Clustered Wound: No Neuropathy Wound Measurements Length: (cm) 3.4 Width: (cm) 2.8 Depth: (cm) 0.2 Area: (cm) 7.477 Volume: (cm) 1.495 % Reduction in Area: 32% % Reduction in Volume: 84.9% Epithelialization: Small (1-33%) Tunneling: No Undermining: No Wound Description Classification: Grade 2 Wound Margin: Distinct, outline attached Exudate Amount: Large Exudate Type: Serosanguineous Exudate Color: red, brown Foul Odor After Cleansing: No Slough/Fibrino No Wound Bed Granulation Amount: Large (67-100%) Exposed Structure Granulation Quality: Red Fascia Exposed: No Necrotic Amount: None Present (0%) Fat Layer (Subcutaneous Tissue) Exposed: Yes Tendon Exposed: No Muscle Exposed: No Joint Exposed: No Bone Exposed: No Electronic Signature(s) Signed: 06/10/2019 5:12:22 PM By: Levan Hurst RN, BSN Entered By: Levan Hurst on 06/07/2019 14:01:30 -------------------------------------------------------------------------------- Vitals Details Patient Name: Date of Service: [name], [name] 06/07/2019 1:00 PM Medical Record Number: [id number] Patient Account Number: [id number] Date of Birth/Sex: Treating RN: 1955-11-21 (64 y.o. [name] Primary Care Marcelis Wissner: MA SO UDI, [place] MA LSA DA T Other Clinician: Referring Tanveer Dobberstein: Treating Phoenyx Melka/Extender: Linton Ham MA SO UDI, ELHA MA LSA DA T Weeks in Treatment: 18 Vital Signs Time Taken: 13:16 Temperature (F): 98.3 Height (in): 68 Pulse (bpm): 79 Weight (lbs): 265 Respiratory Rate (breaths/min): 18 Body Mass Index (BMI): 40.3 Blood Pressure (mmHg): 125/59 Capillary Blood Glucose  (mg/dl): 130 Reference Range: 80 - 120 mg / dl Notes glucose per pt report Electronic Signature(s) Signed: 06/10/2019 5:12:22 PM By: Levan Hurst RN, BSN Entered By: Levan Hurst on 06/07/2019 13:17:05

## 2019-06-10 NOTE — Progress Notes (Signed)
REJOICE, HEATWOLE ([id number]) Visit Report for 05/02/2019 Arrival Information Details Patient Name: Date of Service: [name], [name] 05/02/2019 1:00 PM Medical Record Number: [id number] Patient Account Number: [id number] Date of Birth/Sex: Treating RN: [birth year] (64 y.o. F) Dwiggins, Carollee Herter Primary Care Melisse Caetano: MA SO UDI, [place] MA LSA DA T Other Clinician: Referring Zion Lint: Treating Margarete Horace/Extender: Baltazar Najjar MA SO UDI, ELHA MA LSA DA T Weeks in Treatment: 13 Visit Information History Since Last Visit Added or deleted any medications: No Patient Arrived: Crutches Any new allergies or adverse reactions: No Arrival Time: 13:11 Had a fall or experienced change in No Accompanied By: self activities of daily living that may affect Transfer Assistance: None risk of falls: Patient Identification Verified: Yes Signs or symptoms of abuse/neglect since last visito No Secondary Verification Process Completed: Yes Hospitalized since last visit: No Patient Requires Transmission-Based Precautions: No Implantable device outside of the clinic excluding No Patient Has Alerts: No cellular tissue based products placed in the center since last visit: Has Dressing in Place as Prescribed: Yes Has Footwear/Offloading in Place as Prescribed: Yes Right: T Contact Cast otal Pain Present Now: No Electronic Signature(s) Signed: 05/03/2019 4:21:34 PM By: [name] Entered By: [name] on 05/02/2019 13:12:01 -------------------------------------------------------------------------------- Encounter Discharge Information Details Patient Name: Date of Service: [name], [name] 05/02/2019 1:00 PM Medical Record Number: [id number] Patient Account Number: [id number] Date of Birth/Sex: Treating RN: Jul 22, 1955 (64 y.o. [name] Primary Care Carina Chaplin: MA SO UDI, Judene Companion MA LSA DA T Other Clinician: Referring Rylen Swindler: Treating Brandilyn Nanninga/Extender: Baltazar Najjar MA  SO UDI, ELHA MA LSA DA T Weeks in Treatment: 10 Encounter Discharge Information Items Discharge Condition: Stable Ambulatory Status: Crutches Discharge Destination: Home Transportation: Private Auto Accompanied By: self Schedule Follow-up Appointment: Yes Clinical Summary of Care: Patient Declined Electronic Signature(s) Signed: 05/03/2019 4:21:34 PM By: [name] Entered By: [name] on 05/02/2019 14:27:51 -------------------------------------------------------------------------------- Lower Extremity Assessment Details Patient Name: Date of Service: ENO [facility], [name] 05/02/2019 1:00 PM Medical Record Number: [id number] Patient Account Number: [id number] Date of Birth/Sex: Treating RN: 1955-03-21 (64 y.o. F) [name] Primary Care Shaneeka Scarboro: MA SO UDI, [place] MA LSA DA T Other Clinician: Referring Angelos Wasco: Treating Syncere Kaminski/Extender: Baltazar Najjar MA SO UDI, ELHA MA LSA DA T Weeks in Treatment: 13 Edema Assessment Assessed: [Left: No] [Right: No] Edema: [Left: Yes] [Right: Yes] Calf Left: Right: Point of Measurement: 43 cm From Medial Instep 38.5 cm 34.5 cm Ankle Left: Right: Point of Measurement: 12 cm From Medial Instep 25.3 cm 28.1 cm Vascular Assessment Pulses: Dorsalis Pedis Palpable: [Left:Yes] [Right:Yes] Electronic Signature(s) Signed: 05/03/2019 4:21:34 PM By: [name] Entered By: [name] on 05/02/2019 13:13:24 -------------------------------------------------------------------------------- Multi Wound Chart Details Patient Name: Date of Service: [name], [name] 05/02/2019 1:00 PM Medical Record Number: [id number] Patient Account Number: [id number] Date of Birth/Sex: Treating RN: 06-25-1955 (63 y.o. [name] Primary Care Javelle Donigan: MA SO UDI, Judene Companion MA LSA DA T Other Clinician: Referring Sharalyn Lomba: Treating Rashawnda Gaba/Extender: Baltazar Najjar MA SO UDI, ELHA MA LSA DA T Weeks in Treatment: 13 Vital  Signs Height(in): 68 Pulse(bpm): 72 Weight(lbs): 265 Blood Pressure(mmHg): 118/45 Body Mass Index(BMI): 40 Temperature(F): 98.3 Respiratory Rate(breaths/min): 18 Photos: [10:No Photos Left, Lateral, Plantar Foot] [9:No Photos Right, Medial Foot] [N/A:N/A N/A] Wound Location: [10:Blister] [9:Blister] [N/A:N/A] Wounding Event: [10:Diabetic Wound/Ulcer of the Lower] [9:Diabetic Wound/Ulcer of the Lower] [N/A:N/A] Primary Etiology: [10:Extremity Glaucoma, Chronic sinus] [9:Extremity Glaucoma, Chronic sinus] [N/A:N/A] Comorbid History: [10:problems/congestion, Anemia, Sleep Apnea, Hypertension, Type II Diabetes, Rheumatoid  Arthritis, Neuropathy 11/18/2018] [9:problems/congestion, Anemia, Sleep Apnea, Hypertension, Type II Diabetes, Rheumatoid Arthritis, Neuropathy  06/18/2018] [N/A:N/A] Date Acquired: [10:13] [9:13] [N/A:N/A] Weeks of Treatment: [10:Open] [9:Open] [N/A:N/A] Wound Status: [10:1.5x1.5x1.9] [9:3.8x3x0.5] [N/A:N/A] Measurements L x W x D (cm) [10:1.767] [6:1.443] [N/A:N/A] A (cm) : rea [10:3.358] [9:4.477] [N/A:N/A] Volume (cm) : [10:-0.50%] [9:18.60%] [N/A:N/A] % Reduction in A rea: [10:-27.20%] [9:54.80%] [N/A:N/A] % Reduction in Volume: [10:Grade 2] [9:Grade 2] [N/A:N/A] Classification: [10:Medium] [9:Large] [N/A:N/A] Exudate A mount: [10:Serosanguineous] [9:Purulent] [N/A:N/A] Exudate Type: [10:red, brown] [9:yellow, brown, green] [N/A:N/A] Exudate Color: [10:Thickened] [9:Thickened] [N/A:N/A] Wound Margin: [10:Large (67-100%)] [9:Large (67-100%)] [N/A:N/A] Granulation A mount: [10:Red, Pink] [9:Red, Pink, Hyper-granulation] [N/A:N/A] Granulation Quality: [10:None Present (0%)] [9:None Present (0%)] [N/A:N/A] Necrotic A mount: [10:Fat Layer (Subcutaneous Tissue)] [9:Fat Layer (Subcutaneous Tissue)] [N/A:N/A] Exposed Structures: [10:Exposed: Yes Muscle: Yes Fascia: No Tendon: No Joint: No Bone: No None] [9:Exposed: Yes Fascia: No Tendon: No Muscle: No Joint: No Bone: No  Small (1-33%)] [N/A:N/A] Epithelialization: [10:N/A] [9:T Contact Cast otal] [N/A:N/A] Treatment Notes Electronic Signature(s) Signed: 05/04/2019 6:55:41 AM By: Linton Ham MD Signed: 06/10/2019 1:31:05 PM By: Deon Pilling Entered By: Linton Ham on 05/02/2019 14:07:36 -------------------------------------------------------------------------------- Multi-Disciplinary Care Plan Details Patient Name: Date of Service: [facility], Raine J. 05/02/2019 1:00 PM Medical Record Number: [id number] Patient Account Number: [id number] Date of Birth/Sex: Treating RN: [birth year] (64 y.o. [name] Primary Care Steffanie Mingle: MA SO UDI, Willette Pa MA LSA DA T Other Clinician: Referring Cachet Mccutchen: Treating Tyler Robidoux/Extender: Linton Ham MA SO UDI, ELHA MA LSA DA T Weeks in Treatment: 13 Active Inactive Wound/Skin Impairment Nursing Diagnoses: Impaired tissue integrity Knowledge deficit related to ulceration/compromised skin integrity Goals: Patient/caregiver will verbalize understanding of skin care regimen Date Initiated: 01/28/2019 Target Resolution Date: 05/16/2019 Goal Status: Active Interventions: Assess patient/caregiver ability to obtain necessary supplies Assess patient/caregiver ability to perform ulcer/skin care regimen upon admission and as needed Assess ulceration(s) every visit Provide education on ulcer and skin care Notes: Electronic Signature(s) Signed: 05/02/2019 4:13:32 PM By: Deon Pilling Signed: 06/10/2019 1:31:05 PM By: Deon Pilling Signed: 06/10/2019 1:31:05 PM By: Deon Pilling Entered By: Deon Pilling on 05/02/2019 13:08:26 -------------------------------------------------------------------------------- Pain Assessment Details Patient Name: Date of Service: ENO [facility], Annell J. 05/02/2019 1:00 PM Medical Record Number: [id number] Patient Account Number: [id number] Date of Birth/Sex: Treating RN: 1955-08-03 (64 y.o. Clearnce Sorrel Primary Care Aly Hauser:  MA SO UDI, [place] MA LSA DA T Other Clinician: Referring Dawnisha Marquina: Treating Bayli Quesinberry/Extender: Linton Ham MA SO UDI, ELHA MA LSA DA T Weeks in Treatment: 13 Active Problems Location of Pain Severity and Description of Pain Patient Has Paino No Site Locations Pain Management and Medication Current Pain Management: Electronic Signature(s) Signed: 05/03/2019 4:21:34 PM By: Kela Millin Entered By: Kela Millin on 05/02/2019 13:13:17 -------------------------------------------------------------------------------- Patient/Caregiver Education Details Patient Name: Date of Service: ENO [facility] 4/15/2021andnbsp1:00 PM Medical Record Number: [id number] Patient Account Number: [id number] Date of Birth/Gender: Treating RN: 1955/11/21 (64 y.o. [name] Primary Care Physician: MA SO UDI, Willette Pa MA LSA DA T Other Clinician: Referring Physician: Treating Physician/Extender: Linton Ham MA SO UDI, ELHA MA LSA DA T Weeks in Treatment: 42 Education Assessment Education Provided To: Patient Education Topics Provided Wound/Skin Impairment: Handouts: Skin Care Do's and Dont's Methods: Explain/Verbal Responses: Reinforcements needed Electronic Signature(s) Signed: 05/02/2019 4:13:32 PM By: Deon Pilling Entered By: Deon Pilling on 05/02/2019 13:08:42 -------------------------------------------------------------------------------- Wound Assessment Details Patient Name: Date of Service: ENO [facility], Alyana J. 05/02/2019 1:00 PM Medical Record Number: [id number] Patient Account Number: [id number] Date of Birth/Sex: Treating RN:  July 03, 1955 (64 y.o. F) Dwiggins, Shannon Primary Care Dezmon Conover: MA SO UDI, [place] MA LSA DA T Other Clinician: Referring Syvanna Ciolino: Treating Kamiah Fite/Extender: Baltazar Najjar MA SO UDI, ELHA MA LSA DA T Weeks in Treatment: 13 Wound Status Wound Number: 10 Primary Diabetic Wound/Ulcer of the Lower Extremity Etiology: Wound Location: Left,  Lateral, Plantar Foot Wound Open Wounding Event: Blister Status: Date Acquired: 11/18/2018 Comorbid Glaucoma, Chronic sinus problems/congestion, Anemia, Sleep Weeks Of Treatment: 13 History: Apnea, Hypertension, Type II Diabetes, Rheumatoid Arthritis, Clustered Wound: No Neuropathy Wound Measurements Length: (cm) 1.5 Width: (cm) 1.5 Depth: (cm) 1.9 Area: (cm) 1.767 Volume: (cm) 3.358 % Reduction in Area: -0.5% % Reduction in Volume: -27.2% Epithelialization: None Tunneling: No Undermining: No Wound Description Classification: Grade 2 Wound Margin: Thickened Exudate Amount: Medium Exudate Type: Serosanguineous Exudate Color: red, brown Foul Odor After Cleansing: No Slough/Fibrino No Wound Bed Granulation Amount: Large (67-100%) Exposed Structure Granulation Quality: Red, Pink Fascia Exposed: No Necrotic Amount: None Present (0%) Fat Layer (Subcutaneous Tissue) Exposed: Yes Tendon Exposed: No Muscle Exposed: Yes Necrosis of Muscle: No Joint Exposed: No Bone Exposed: No Electronic Signature(s) Signed: 05/03/2019 4:21:34 PM By: [name] Entered By: [name] on 05/02/2019 13:14:15 -------------------------------------------------------------------------------- Wound Assessment Details Patient Name: Date of Service: ENO [facility], Amarisa J. 05/02/2019 1:00 PM Medical Record Number: [id number] Patient Account Number: [id number] Date of Birth/Sex: Treating RN: 17-Sep-1955 (64 y.o. F) Dwiggins, Shannon Primary Care Kensey Luepke: MA SO UDI, [place] MA LSA DA T Other Clinician: Referring Murice Barbar: Treating Mohamud Mrozek/Extender: Baltazar Najjar MA SO UDI, ELHA MA LSA DA T Weeks in Treatment: 13 Wound Status Wound Number: 9 Primary Diabetic Wound/Ulcer of the Lower Extremity Etiology: Wound Location: Right, Medial Foot Wound Open Wounding Event: Blister Status: Date Acquired: 06/18/2018 Comorbid Glaucoma, Chronic sinus problems/congestion, Anemia, Sleep Weeks Of  Treatment: 13 History: Apnea, Hypertension, Type II Diabetes, Rheumatoid Arthritis, Clustered Wound: No Neuropathy Wound Measurements Length: (cm) 3.8 Width: (cm) 3 Depth: (cm) 0.5 Area: (cm) 8.954 Volume: (cm) 4.477 % Reduction in Area: 18.6% % Reduction in Volume: 54.8% Epithelialization: Small (1-33%) Tunneling: No Undermining: No Wound Description Classification: Grade 2 Wound Margin: Thickened Exudate Amount: Large Exudate Type: Purulent Exudate Color: yellow, brown, green Foul Odor After Cleansing: No Slough/Fibrino No Wound Bed Granulation Amount: Large (67-100%) Exposed Structure Granulation Quality: Red, Pink, Hyper-granulation Fascia Exposed: No Necrotic Amount: None Present (0%) Fat Layer (Subcutaneous Tissue) Exposed: Yes Tendon Exposed: No Muscle Exposed: No Joint Exposed: No Bone Exposed: No Electronic Signature(s) Signed: 05/03/2019 4:21:34 PM By: [name] Entered By: [name] on 05/02/2019 13:14:28 -------------------------------------------------------------------------------- Vitals Details Patient Name: Date of Service: [name], [place] J. 05/02/2019 1:00 PM Medical Record Number: [id number] Patient Account Number: [id number] Date of Birth/Sex: Treating RN: 12-Jun-1955 (64 y.o. F) Dwiggins, Shannon Primary Care Yula Crotwell: MA SO UDI, [place] MA LSA DA T Other Clinician: Referring Romar Woodrick: Treating Lillyanne Bradburn/Extender: Baltazar Najjar MA SO UDI, ELHA MA LSA DA T Weeks in Treatment: 13 Vital Signs Time Taken: 13:10 Temperature (F): 98.3 Height (in): 68 Pulse (bpm): 72 Weight (lbs): 265 Respiratory Rate (breaths/min): 18 Body Mass Index (BMI): 40.3 Blood Pressure (mmHg): 118/45 Reference Range: 80 - 120 mg / dl Electronic Signature(s) Signed: 05/03/2019 4:21:34 PM By: [name] Entered By: [name] on 05/02/2019 13:12:20

## 2019-06-10 NOTE — Progress Notes (Signed)
TNYA, ADES ([id number]) Visit Report for 06/07/2019 HPI Details Patient Name: Date of Service: [name], [name] 06/07/2019 1:00 PM Medical Record Number: [id number] Patient Account Number: [id number] Date of Birth/Sex: Treating RN: January 05, 1956 (64 y.o. F) [name] Primary Care Provider: MA SO UDI, [place] MA LSA DA T Other Clinician: Referring Provider: Treating Provider/Extender: Baltazar Najjar MA SO UDI, ELHA MA LSA DA T Weeks in Treatment: 18 History of Present Illness HPI Description: 64 yrs old bf here for follow up recurrent left charcot foot ulcer. She has DM. She is not a smoker. She states a blister developed 3 months ago at site of previous breakdown from 1 year ago. It was debrided at the podiatrist's office. She went to the ER and then sent here. She denies pain. Xray was negative for osteo. Culture from this clinic negative. 09/08/14 [name] to [name] for Charcot foot. Seen by him and MRI scheduled 09/19/14. Prior silver collagen but this was not ordered last visit, patient has been rinsing with saline alone. Re institute silver collagen every other day. F/u 2 weeks with [name] as Labor Day holiday. Supplies ordered. 09/25/14; this is a patient with a Charcot foot on the left. she has a wound over the plantar aspect of her left calcaneus. She has been seen by [name] of orthopedic surgery and has had an MRI. She is apparently going within the next week or 2 for corrective surgery which will involve an external fixator. I don't have any of the details of this. 10/06/14; The patient is a 64 yrs old bf with a left Charcot foot and ulcer on the plantar. 12/01/14 Returns post surgery with [name]. Has follow up visit later this week with him, currently in facility and NWB over this extremity. States no drainage from foot and no current wound care. On exam callus and scab in place, suspect healed. Has external fixator in place. Will defer to Dr.  [name] for management, ok to place moisturizing cream over scabs and callus and she will call if she requires follow up here. READMISSION 01/28/2019 Patient was in the clinic in 2016 for a wound on the plantar aspect of her left calcaneus. Predominantly cared for by [name] she was referred to [name] and had corrective surgery for the position of her left ankle I believe. She had previously been here in 2015 and then prior to that in 2011 2012 that I do not have these records. More recently she has developed fairly extensive wounds on the right medial foot, left lateral foot and a small area on the right medial great toe. Right medial foot wound has been there for 6 to 7 months, left foot wound for 2 to 3 months and the right great toe only over the last few weeks. She has been followed by [name] at Dr. Laverta [place] office it sounds as though she has been undergoing callus paring and silver nitrate. The patient has been washing these off with soap and water and plan applying dry dressing Past medical history, type 2 diabetes well controlled with a recent hemoglobin A1c of 7 on 11/16, type 2 diabetes with peripheral neuropathy, previous left Charcot joint surgery bilateral Charcot joints currently, stage III chronic renal failure, hypertension, obstructive sleep apnea, history of MRSA and gastroesophageal reflux. 1/18; this is a patient with bilateral Charcot feet. Plain x-rays that I did of both of these wounds that are either at bone or precariously close to bone  were done. On the right foot this showed possible lytic destruction involving the anterior aspect of the talus which may represent a wound osteomyelitis. An MRI was recommended. On the left there was no definite lytic destruction although the wound is deep undermining's and I think probably requires an MRI on this basis in any case 1/25; patient was supposed to have her MRI last Friday of her bilateral feet however they would  not do it because there was silver alginate in her dressings, will replace with calcium alginate today and will see if we can get her done today 2/1; patient did not get her MRIs done last week because of issues with the MRI department receiving orders to do bilateral feet. She has 2 deep wounds in the setting of Charcot feet deformities bilaterally. Both of them at one point had exposed bone although I had trouble demonstrating that today. I am not sure that we can offload these very aggressively as I watched her leave the clinic last week her gait is very narrow and unsteady. 2/15; the MRI of her right foot did not show osteomyelitis potentially osteonecrosis of the talus. 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 The wounds are a large wound on the right medial foot and on the left a substantial wound on the left lateral plantar calcaneus. We have been using silver alginate. Ultimately I think we need to consider a total contact cast on the right while we work through the possibility of osteomyelitis in the left. I watched her walk out with her crutches I have some trepidation about the ability to walk with the cast on her right foot. Nevertheless with her Charcot deformities I do not think there is a way to heal these short of offloading them aggressively. 2/22; the culture of the bone in the left foot that I did last week showed a few Citrobacter Koseri as well as some streptococcal species. In my attempted bone for pathology did not actually yield a lot of bone the pathologist did not identify any osteomyelitis. 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 Is promised today and aggressive debridement of the right foot and a total contact cast which surprisingly she seemed to tolerate quite well 03/13/2019  upon evaluation today patient appears to be doing well with her total contact cast she is here for the first cast change. She had no areas of rubbing or any other complications at this point. Overall things are doing quite well. 2/26; the patient was kindly seen by Dr. Megan Salon of infectious disease on 2/25. She is now on IV vancomycin PICC line placement in 2 days and oral Flagyl. This was in response to her MRI showing osteomyelitis of the left calcaneus concerning for osteomyelitis. We have been putting her right leg in a total contact cast. Our nurses report drainage. She is tolerating the total contact cast well. 3/4; the patient is started her IV antibiotics and is taking IV vancomycin and oral Flagyl. She appears to be tolerating these both well. This is for osteomyelitis involving the left calcaneus. The area on the right in the setting of bilateral Charcot deformities did not have any bone infection on MRI. We put a total contact cast on her with silver alginate as the primary dressing and she returns today in follow-up. Per  our intake nurse there was too much drainage to leave this on all week therefore we will bring her back for an early change 3/8; follow-up total contact cast she is still having a lot of drainage probably too much drainage from the right foot wound will week with the same cast. She will be back on Thursday. The area on the left looks somewhat better 3/11; patient here for a total contact cast change. 3/15; patient came in for wound care evaluation. She is still on IV vancomycin and oral Flagyl for osteomyelitis in the left foot. The area on the right foot we are putting in a total contact cast. 3/18 for total contact cast change on the right foot 3/22; the patient was here for wound review today. The area on the right foot is slightly smaller in terms of surface area. However the surface of the wound is very gritty and hyper granulated. More problematically the area on  the plantar left foot has increased depth indeed in the center of this it probes to bone. We have been using Hydrofera Blue in both areas. She is on antibiotics as prescribed by infectious disease IV vancomycin and oral Flagyl 3/25; the patient is in for total contact cast change. Our intake nurse was concerned about the amount of drainage which soaked right to the multiple layers we are putting on this. Wondered about options. The wound bed actually looks as healthy as this is looked although there may not be a lot of change in dimensions things are looking a lot better. If we are going to heal this woman's wound which is in the middle of her right Charcot foot this is going to need to be offloaded. There are not many alternatives 3/29; still not a lot of improvement although the surface of the right wound looks better. We have been using Hydrofera Blue 04/17/2019 upon evaluation today patient appears to be doing well with a total contact cast. With actually having to change this twice a week. She saw Dr. Dellia Nims for wound care earlier in the week this is for the cast change today. That is due to the fact that she has significant drainage. Overall the wound is been looking excellent however. 4/5; we continue to put a total contact cast on this patient with Hydrofera Blue. Still a lot of drainage which precludes a simple weekly change or changing this twice a week. 4/8; total contact cast changed today. Apparently the wound looks better per our intake nurse 4/12; patient here for wound care evaluation. Wounds on the right foot have not changed in dimension none about a month left foot looks similar. Apparently her antibiotics that we are giving her for osteomyelitis in the left foot complete on Wednesday. We have been using Hydrofera Blue on the right foot under a total contact cast. Nurses report greenish drainage although I think this may be discoloration from the [facility] 4/15; back for cast  change wounds look quite good although still moderate amount of drainage. T contact cast reapplied otal 4/19;. Wound evaluation. Wound on the right foot is smaller surface looks healthy. There is still the divot in the middle part of this wound but I could not feel any bone. Area on the left foot also looks better She has completed her antibiotics but still has the PICC line in. 4/23; patient comes back for a total contact cast change this was done in the standard fashion no issues 4/26; wound measures slightly larger on the right  foot which is disappointing. She sees Dr. Orvan Falconer with regards to her antibiotics on Wednesday. I believe she is on vancomycin IV and oral Flagyl. 4/29; in for her routine total contact cast change. She saw Dr. Orvan Falconer this morning. He is asking about antibiotic continuation I will be in touch with him. I did not look at her wound today 5/3; patient saw Dr. Orvan Falconer on 4/28. I did send him a note asking about the continuation of perhaps an oral regimen. I have not heard back. She still has an elevated sedimentation rate at 81. Her wounds are smaller. 5/6; in for her total contact cast change. We did not look at the wound today. She has seen Dr. Orvan Falconer and is now on oral Keflex and Flagyl. She seems to be tolerating these well 5/10; she had her PICC line removed. The wound on the right foot after some initial improvement has not made any improvement even with the total contact cast. Still having a lot of drainage. Likewise the area on the left foot really is not any better either 5/14; in for a cast change today. We will look at the right foot on Monday. If this is not making any progress I am going to try to cast the left foot. 5/17; the wound on the right foot is absolutely no different. I am going to put ointment on this area and change the cast of the left foot. We have been using silver alginate there 5/21; apparently the area on the right foot was better per our  intake nurse although I did not see it. We did a standard total contact cast change on the left foot we have been applying silver alginate to the wound here. Electronic Signature(s) Signed: 06/07/2019 6:00:23 PM By: Baltazar Najjar MD Entered By: Baltazar Najjar on 06/07/2019 14:16:26 -------------------------------------------------------------------------------- Physical Exam Details Patient Name: Date of Service: [name], Carolyne J. 06/07/2019 1:00 PM Medical Record Number: [id number] Patient Account Number: [id number] Date of Birth/Sex: Treating RN: Feb 17, 1955 (64 y.o. [name] Primary Care Provider: MA SO UDI, [place] MA LSA DA T Other Clinician: Referring Provider: Treating Provider/Extender: Baltazar Najjar MA SO UDI, ELHA MA LSA DA T Weeks in Treatment: 18 Constitutional Sitting or standing Blood Pressure is within target range for patient.. Pulse regular and within target range for patient.Marland Kitchen Respirations regular, non-labored and within target range.. Temperature is normal and within the target range for the patient.Marland Kitchen Appears in no distress. Notes Patient's wounds were not examined. T contact cast change on the left foot. otal Electronic Signature(s) Signed: 06/07/2019 6:00:23 PM By: Baltazar Najjar MD Entered By: Baltazar Najjar on 06/07/2019 14:17:05 -------------------------------------------------------------------------------- Physician Orders Details Patient Name: Date of Service: [facility], Shada J. 06/07/2019 1:00 PM Medical Record Number: [id number] Patient Account Number: [id number] Date of Birth/Sex: Treating RN: [birth year] (64 y.o. F) Dwiggins, Carollee Herter Primary Care Provider: MA SO UDI, [place] MA LSA DA T Other Clinician: Referring Provider: Treating Provider/Extender: Baltazar Najjar MA SO UDI, ELHA MA LSA DA T Weeks in Treatment: 37 Verbal / Phone Orders: No Diagnosis Coding ICD-10 Coding Code Description E11.621 Type 2 diabetes mellitus with foot  ulcer L97.524 Non-pressure chronic ulcer of other part of left foot with necrosis of bone L97.511 Non-pressure chronic ulcer of other part of right foot limited to breakdown of skin L97.514 Non-pressure chronic ulcer of other part of right foot with necrosis of bone E11.42 Type 2 diabetes mellitus with diabetic polyneuropathy M14.671 Charcot's joint, right ankle and foot M14.672 Charcot's joint, left  ankle and foot 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 Dressing Change Frequency Wound #10 Left,Lateral,Plantar Foot Do not change entire dressing for one week. Wound #9 Right,Medial Foot Change Dressing every other day. Wound Cleansing May shower with protection. Primary Wound Dressing Wound #10 Left,Lateral,Plantar Foot Calcium Alginate with Silver Wound #9 Right,Medial Foot Polymem Silver Secondary Dressing Wound #10 Left,Lateral,Plantar Foot Foam Kerlix/Rolled Gauze Dry Gauze Drawtex Wound #9 Right,Medial Foot Kerlix/Rolled Gauze ABD pad Zetuvit or Kerramax Off-Loading Total Contact Cast to Left Lower Extremity Open toe surgical shoe to: - foot Electronic Signature(s) Signed: 06/07/2019 5:37:38 PM By: [name] Signed: 06/07/2019 6:00:23 PM By: Baltazar Najjar MD Entered By: [name] on 06/07/2019 12:54:17 -------------------------------------------------------------------------------- Problem List Details Patient Name: Date of Service: [facility], Nerine J. 06/07/2019 1:00 PM Medical Record Number: [id number] Patient Account Number: [id number] Date of Birth/Sex: Treating RN: 10-07-1955 (64 y.o. F) [name] Primary Care Provider: MA SO UDI, Judene Companion MA LSA DA T Other Clinician: Referring Provider: Treating Provider/Extender: Abe People, ELHA MA LSA DA T Weeks in Treatment: 18 Active Problems ICD-10 Encounter Code Description Active Date MDM Diagnosis E11.621  Type 2 diabetes mellitus with foot ulcer 01/28/2019 No Yes L97.524 Non-pressure chronic ulcer of other part of left foot with necrosis of bone 01/28/2019 No Yes L97.511 Non-pressure chronic ulcer of other part of right foot limited to breakdown of 01/28/2019 No Yes skin L97.514 Non-pressure chronic ulcer of other part of right foot with necrosis of bone 01/28/2019 No Yes E11.42 Type 2 diabetes mellitus with diabetic polyneuropathy 01/28/2019 No Yes M14.671 Charcot's joint, right ankle and foot 01/28/2019 No Yes M14.672 Charcot's joint, left ankle and foot 01/28/2019 No Yes 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 06/07/2019 1:00 PM Medical Record Number: [id number] Patient Account Number: [id number] Date of Birth/Sex: Treating RN: 04-22-1955 (64 y.o. F) [name] Primary Care Provider: Other Clinician: MA SO UDI, [place]le LSA DA T Referring Provider: Treating Provider/Extender: Baltazar Najjar MA SO UDI, ELHA MA LSA DA T Weeks in Treatment: 18 Subjective History of Present Illness (HPI) 64 yrs old bf here for follow up recurrent left charcot foot ulcer. She has DM. She is not a smoker. She states a blister developed 3 months ago at site of previous breakdown from 1 year ago. It was debrided at the podiatrist's office. She went to the ER and then sent here. She denies pain. Xray was negative for osteo. Culture from this clinic negative. 09/08/14 Referred by [name] to [name] for Charcot foot. Seen by him and MRI scheduled 09/19/14. Prior silver collagen but this was not ordered last visit, patient has been rinsing with saline alone. Re  institute silver collagen every other day. F/u 2 weeks with [name] as Labor Day holiday. Supplies ordered. 09/25/14; this is a patient with a Charcot foot on the left. she has a wound over the plantar aspect of her left calcaneus. She has been seen by [name] of orthopedic surgery and has had an MRI. She is apparently going within the next week or 2 for corrective surgery which will involve an external fixator. I don't have any of the details of this. 10/06/14; The patient is a 64 yrs old bf  with a left Charcot foot and ulcer on the plantar. 12/01/14 Returns post surgery with [name]. Has follow up visit later this week with him, currently in facility and NWB over this extremity. States no drainage from foot and no current wound care. On exam callus and scab in place, suspect healed. Has external fixator in place. Will defer to [name] for management, ok to place moisturizing cream over scabs and callus and she will call if she requires follow up here. READMISSION 01/28/2019 Patient was in the clinic in 2016 for a wound on the plantar aspect of her left calcaneus. Predominantly cared for by [name] she was referred to [name] and had corrective surgery for the position of her left ankle I believe. She had previously been here in 2015 and then prior to that in 2011 2012 that I do not have these records. More recently she has developed fairly extensive wounds on the right medial foot, left lateral foot and a small area on the right medial great toe. Right medial foot wound has been there for 6 to 7 months, left foot wound for 2 to 3 months and the right great toe only over the last few weeks. She has been followed by [name] at Dr. Laverta [place] office it sounds as though she has been undergoing callus paring and silver nitrate. The patient has been washing these off with soap and water and plan applying dry dressing Past medical history, type 2 diabetes well controlled with a  recent hemoglobin A1c of 7 on 11/16, type 2 diabetes with peripheral neuropathy, previous left Charcot joint surgery bilateral Charcot joints currently, stage III chronic renal failure, hypertension, obstructive sleep apnea, history of MRSA and gastroesophageal reflux. 1/18; this is a patient with bilateral Charcot feet. Plain x-rays that I did of both of these wounds that are either at bone or precariously close to bone were done. On the right foot this showed possible lytic destruction involving the anterior aspect of the talus which may represent a wound osteomyelitis. An MRI was recommended. On the left there was no definite lytic destruction although the wound is deep undermining's and I think probably requires an MRI on this basis in any case 1/25; patient was supposed to have her MRI last Friday of her bilateral feet however they would not do it because there was silver alginate in her dressings, will replace with calcium alginate today and will see if we can get her done today 2/1; patient did not get her MRIs done last week because of issues with the MRI department receiving orders to do bilateral feet. She has 2 deep wounds in the setting of Charcot feet deformities bilaterally. Both of them at one point had exposed bone although I had trouble demonstrating that today. I am not sure that we can offload these very aggressively as I watched her leave the clinic last week her gait is very narrow and unsteady. 2/15; the MRI of her right foot did not show osteomyelitis potentially osteonecrosis of the talus. 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 The wounds are a large wound on the right medial foot and on the left a substantial wound on the left lateral plantar calcaneus. We have been using silver alginate. Ultimately I think we need to consider a total contact cast on the right while we work through the possibility of osteomyelitis in the  left. I watched her walk out with her crutches I have  some trepidation about the ability to walk with the cast on her right foot. Nevertheless with her Charcot deformities I do not think there is a way to heal these short of offloading them aggressively. 2/22; the culture of the bone in the left foot that I did last week showed a few Citrobacter Koseri as well as some streptococcal species. In my attempted bone for pathology did not actually yield a lot of bone the pathologist did not identify any osteomyelitis. 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 Is promised today and aggressive debridement of the right foot and a total contact cast which surprisingly she seemed to tolerate quite well 03/13/2019 upon evaluation today patient appears to be doing well with her total contact cast she is here for the first cast change. She had no areas of rubbing or any other complications at this point. Overall things are doing quite well. 2/26; the patient was kindly seen by Dr. Orvan Falconer of infectious disease on 2/25. She is now on IV vancomycin PICC line placement in 2 days and oral Flagyl. This was in response to her MRI showing osteomyelitis of the left calcaneus concerning for osteomyelitis. We have been putting her right leg in a total contact cast. Our nurses report drainage. She is tolerating the total contact cast well. 3/4; the patient is started her IV antibiotics and is taking IV vancomycin and oral Flagyl. She appears to be tolerating these both well. This is for osteomyelitis involving the left calcaneus. The area on the right in the setting of bilateral Charcot deformities did not have any bone infection on MRI. We put a total contact cast on her with silver alginate as the primary dressing and she returns today in follow-up. Per our intake nurse there  was too much drainage to leave this on all week therefore we will bring her back for an early change 3/8; follow-up total contact cast she is still having a lot of drainage probably too much drainage from the right foot wound will week with the same cast. She will be back on Thursday. The area on the left looks somewhat better 3/11; patient here for a total contact cast change. 3/15; patient came in for wound care evaluation. She is still on IV vancomycin and oral Flagyl for osteomyelitis in the left foot. The area on the right foot we are putting in a total contact cast. 3/18 for total contact cast change on the right foot 3/22; the patient was here for wound review today. The area on the right foot is slightly smaller in terms of surface area. However the surface of the wound is very gritty and hyper granulated. More problematically the area on the plantar left foot has increased depth indeed in the center of this it probes to bone. We have been using Hydrofera Blue in both areas. She is on antibiotics as prescribed by infectious disease IV vancomycin and oral Flagyl 3/25; the patient is in for total contact cast change. Our intake nurse was concerned about the amount of drainage which soaked right to the multiple layers we are putting on this. Wondered about options. The wound bed actually looks as healthy as this is looked although there may not be a lot of change in dimensions things are looking a lot better. If we are going to heal this woman's wound which  is in the middle of her right Charcot foot this is going to need to be offloaded. There are not many alternatives 3/29; still not a lot of improvement although the surface of the right wound looks better. We have been using Hydrofera Blue 04/17/2019 upon evaluation today patient appears to be doing well with a total contact cast. With actually having to change this twice a week. She saw Dr. Dellia Nims for wound care earlier in the week this is  for the cast change today. That is due to the fact that she has significant drainage. Overall the wound is been looking excellent however. 4/5; we continue to put a total contact cast on this patient with Hydrofera Blue. Still a lot of drainage which precludes a simple weekly change or changing this twice a week. 4/8; total contact cast changed today. Apparently the wound looks better per our intake nurse 4/12; patient here for wound care evaluation. Wounds on the right foot have not changed in dimension none about a month left foot looks similar. Apparently her antibiotics that we are giving her for osteomyelitis in the left foot complete on Wednesday. We have been using Hydrofera Blue on the right foot under a total contact cast. Nurses report greenish drainage although I think this may be discoloration from the [facility] 4/15; back for cast change wounds look quite good although still moderate amount of drainage. T contact cast reapplied otal 4/19;. Wound evaluation. Wound on the right foot is smaller surface looks healthy. There is still the divot in the middle part of this wound but I could not feel any bone. Area on the left foot also looks better She has completed her antibiotics but still has the PICC line in. 4/23; patient comes back for a total contact cast change this was done in the standard fashion no issues 4/26; wound measures slightly larger on the right foot which is disappointing. She sees Dr. Megan Salon with regards to her antibiotics on Wednesday. I believe she is on vancomycin IV and oral Flagyl. 4/29; in for her routine total contact cast change. She saw Dr. Megan Salon this morning. He is asking about antibiotic continuation I will be in touch with him. I did not look at her wound today 5/3; patient saw Dr. Megan Salon on 4/28. I did send him a note asking about the continuation of perhaps an oral regimen. I have not heard back. She still has an elevated sedimentation rate at  81. Her wounds are smaller. 5/6; in for her total contact cast change. We did not look at the wound today. She has seen Dr. Megan Salon and is now on oral Keflex and Flagyl. She seems to be tolerating these well 5/10; she had her PICC line removed. The wound on the right foot after some initial improvement has not made any improvement even with the total contact cast. Still having a lot of drainage. Likewise the area on the left foot really is not any better either 5/14; in for a cast change today. We will look at the right foot on Monday. If this is not making any progress I am going to try to cast the left foot. 5/17; the wound on the right foot is absolutely no different. I am going to put ointment on this area and change the cast of the left foot. We have been using silver alginate there 5/21; apparently the area on the right foot was better per our intake nurse although I did not see it. We did a  standard total contact cast change on the left foot we have been applying silver alginate to the wound here. Objective Constitutional Sitting or standing Blood Pressure is within target range for patient.. Pulse regular and within target range for patient.Marland Kitchen Respirations regular, non-labored and within target range.. Temperature is normal and within the target range for the patient.Marland Kitchen Appears in no distress. Vitals Time Taken: 1:16 PM, Height: 68 in, Weight: 265 lbs, BMI: 40.3, Temperature: 98.3 F, Pulse: 79 bpm, Respiratory Rate: 18 breaths/min, Blood Pressure: 125/59 mmHg, Capillary Blood Glucose: 130 mg/dl. General Notes: glucose per pt report General Notes: Patient's wounds were not examined. T contact cast change on the left foot. otal Integumentary (Hair, Skin) Wound #10 status is Open. Original cause of wound was Blister. The wound is located on the Left,Lateral,Plantar Foot. The wound measures 1.2cm length x 1.6cm width x 1cm depth; 1.508cm^2 area and 1.508cm^3 volume. There is muscle and Fat  Layer (Subcutaneous Tissue) Exposed exposed. There is no tunneling noted, however, there is undermining starting at 9:00 and ending at 10:00 with a maximum distance of 1.3cm. There is a medium amount of serosanguineous drainage noted. The wound margin is thickened. There is large (67-100%) red, pink granulation within the wound bed. There is no necrotic tissue within the wound bed. Wound #9 status is Open. Original cause of wound was Blister. The wound is located on the Right,Medial Foot. The wound measures 3.4cm length x 2.8cm width x 0.2cm depth; 7.477cm^2 area and 1.495cm^3 volume. There is Fat Layer (Subcutaneous Tissue) Exposed exposed. There is no tunneling or undermining noted. There is a large amount of serosanguineous drainage noted. The wound margin is distinct with the outline attached to the wound base. There is large (67- 100%) red granulation within the wound bed. There is no necrotic tissue within the wound bed. 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 There was a T Contact otal Cast Procedure by [name], MD. Post procedure Diagnosis Wound #10: Same as Pre-Procedure Plan Follow-up Appointments: Return Appointment in 1 week. Dressing Change Frequency: Wound #10 Left,Lateral,Plantar Foot: Do not change entire dressing for one week. Wound #9 Right,Medial Foot: Change  Dressing every other day. Wound Cleansing: May shower with protection. Primary Wound Dressing: Wound #10 Left,Lateral,Plantar Foot: Calcium Alginate with Silver Wound #9 Right,Medial Foot: Polymem Silver Secondary Dressing: Wound #10 Left,Lateral,Plantar Foot: Foam Kerlix/Rolled Gauze Dry Gauze Drawtex Wound #9 Right,Medial Foot: Kerlix/Rolled Gauze ABD pad Zetuvit or Kerramax Off-Loading: T Contact Cast to Left Lower Extremity otal Open toe surgical shoe to: - foot T contact hip check cast change on the left foot. The drainage here is not as severe as on the right we will bring her back in 1 week for a change. otal ooPer our intake nurse the right foot look better using PolyMem Ag Electronic Signature(s) Signed: 06/07/2019 6:00:23 PM By: Baltazar Najjar MD Entered By: Baltazar Najjar on 06/07/2019 14:18:06 -------------------------------------------------------------------------------- Total Contact Cast Details Patient Name: Date of Service:  [name], Cherry J. 06/07/2019 1:00 PM Medical Record Number: [id number] Patient Account Number: [id number] Date of Birth/Sex: Treating RN: 1955-10-07 (64 y.o. [name] Primary Care Provider: MA SO UDI, Judene Companion MA LSA DA T Other Clinician: Referring Provider: Treating Provider/Extender: Baltazar Najjar MA SO UDI, ELHA MA LSA DA T Weeks in Treatment: 70 T Contact Cast Applied for Wound Assessment: otal Wound #10 Left,Lateral,Plantar Foot Performed By: Physician [name], MD Post Procedure Diagnosis Same as Pre-procedure Electronic Signature(s) Signed: 06/07/2019 6:00:23 PM By: Baltazar Najjar MD Signed: 06/10/2019 5:12:22 PM By: Zandra Abts RN, BSN Entered By: Zandra Abts on 06/07/2019 14:01:54 -------------------------------------------------------------------------------- SuperBill Details Patient Name: Date of Service: [facility], Adalyne J. 06/07/2019 Medical Record Number: [id number] Patient Account  Number: [id number] Date of Birth/Sex: Treating RN: [birth year] (64 y.o. F) Dwiggins, Carollee Herter Primary Care Provider: MA SO UDI, [place] MA LSA DA T Other Clinician: Referring Provider: Treating Provider/Extender: Abe People, ELHA MA LSA DA T Weeks in Treatment: 18 Diagnosis Coding ICD-10 Codes Code Description E11.621 Type 2 diabetes mellitus with foot ulcer L97.524 Non-pressure chronic ulcer of other part of left foot with necrosis of bone L97.511 Non-pressure chronic ulcer of other part of right foot limited to breakdown of skin L97.514 Non-pressure chronic ulcer of other part of right foot with necrosis of bone E11.42 Type 2 diabetes mellitus with diabetic polyneuropathy M14.671 Charcot's joint, right ankle and foot M14.672 Charcot's joint, left ankle and foot 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, left ankle and foot Facility Procedures CPT4 Code: [id number] Description: 29445 - APPLY TOTAL CONTACT LEG CAST ICD-10 Diagnosis Description E11.621 Type 2 diabetes mellitus with foot ulcer Modifier: Quantity: 1 Physician Procedures : CPT4 Code Description Modifier [id number] - WC PHYS APPLY TOTAL CONTACT CAST ICD-10 Diagnosis Description E11.621 Type 2 diabetes mellitus with foot ulcer Quantity: 1 Electronic Signature(s) Signed: 06/07/2019 6:00:23 PM By: Baltazar Najjar MD Entered By: Baltazar Najjar on 06/07/2019 14:18:30

## 2019-06-10 NOTE — Progress Notes (Signed)
[name], [name] ([id number]) Visit Report for 05/16/2019 Arrival Information Details Patient Name: Date of Service: [name], [name] 05/16/2019 12:30 PM Medical Record Number: [id number] Patient Account Number: [id number] Date of Birth/Sex: Treating RN: [birth year] (64 y.o. [name] Primary Care Stefhanie Kachmar: MA SO UDI, [place] MA LSA DA T Other Clinician: Referring Kaylla Cobos: Treating Keelan Pomerleau/Extender: Baltazar Najjar MA SO UDI, ELHA MA LSA DA T Weeks in Treatment: 15 Visit Information History Since Last Visit Added or deleted any medications: No Patient Arrived: Crutches Any new allergies or adverse reactions: No Arrival Time: 12:57 Had a fall or experienced change in No Accompanied By: alone activities of daily living that may affect Transfer Assistance: None risk of falls: Patient Identification Verified: Yes Signs or symptoms of abuse/neglect since last visito No Secondary Verification Process Completed: Yes Hospitalized since last visit: No Patient Requires Transmission-Based Precautions: No Implantable device outside of the clinic excluding No Patient Has Alerts: No cellular tissue based products placed in the center since last visit: Has Dressing in Place as Prescribed: Yes Has Footwear/Offloading in Place as Prescribed: Yes Right: T Contact Cast otal Pain Present Now: No Electronic Signature(s) Signed: 05/16/2019 5:31:56 PM By: Zandra Abts RN, BSN Entered By: Zandra Abts on 05/16/2019 12:57:59 -------------------------------------------------------------------------------- Encounter Discharge Information Details Patient Name: Date of Service: [name], [name] 05/16/2019 12:30 PM Medical Record Number: [id number] Patient Account Number: [id number] Date of Birth/Sex: Treating RN: Apr 06, 1955 (64 y.o. [name] Primary Care Kelleen Stolze: MA SO UDI, Judene Companion MA LSA DA T Other Clinician: Referring Wilfredo Canterbury: Treating Kitzia Camus/Extender: Baltazar Najjar MA  SO UDI, ELHA MA LSA DA T Weeks in Treatment: 15 Encounter Discharge Information Items Discharge Condition: Stable Ambulatory Status: Crutches Discharge Destination: Home Transportation: Private Auto Accompanied By: alone Schedule Follow-up Appointment: Yes Clinical Summary of Care: Patient Declined Electronic Signature(s) Signed: 05/16/2019 5:31:56 PM By: Zandra Abts RN, BSN Entered By: Zandra Abts on 05/16/2019 16:14:37 -------------------------------------------------------------------------------- Lower Extremity Assessment Details Patient Name: Date of Service: [name], [name] 05/16/2019 12:30 PM Medical Record Number: [id number] Patient Account Number: [id number] Date of Birth/Sex: Treating RN: January 08, 1956 (64 y.o. [name] Primary Care Siri Buege: MA SO UDI, [place] MA LSA DA T Other Clinician: Referring Saddie Sandeen: Treating Salik Grewell/Extender: Baltazar Najjar MA SO UDI, ELHA MA LSA DA T Weeks in Treatment: 15 Edema Assessment Assessed: [Left: No] [Right: No] Edema: [Left: Yes] [Right: Yes] Calf Left: Right: Point of Measurement: 43 cm From Medial Instep 38.5 cm 37 cm Ankle Left: Right: Point of Measurement: 12 cm From Medial Instep 24 cm 28 cm Vascular Assessment Pulses: Dorsalis Pedis Palpable: [Right:Yes] Electronic Signature(s) Signed: 05/16/2019 5:31:56 PM By: Zandra Abts RN, BSN Entered By: Zandra Abts on 05/16/2019 13:24:03 -------------------------------------------------------------------------------- Multi Wound Chart Details Patient Name: Date of Service: [name], [name] 05/16/2019 12:30 PM Medical Record Number: [id number] Patient Account Number: [id number] Date of Birth/Sex: Treating RN: 1955-05-04 (64 y.o. [name] Primary Care Taran Hable: MA SO UDI, Judene Companion MA LSA DA T Other Clinician: Referring Aaditya Letizia: Treating Adreena Willits/Extender: Baltazar Najjar MA SO UDI, ELHA MA LSA DA T Weeks in Treatment: 15 Vital  Signs Height(in): 68 Capillary Blood Glucose(mg/dl): 419 Weight(lbs): 379 Pulse(bpm): 70 Body Mass Index(BMI): 40 Blood Pressure(mmHg): 138/66 Temperature(F): 98.0 Respiratory Rate(breaths/min): 18 Photos: [9:No Photos Right, Medial Foot] [N/A:N/A N/A] Wound Location: [9:Blister] [N/A:N/A] Wounding Event: [9:Diabetic Wound/Ulcer of the Lower] [N/A:N/A] Primary Etiology: [9:Extremity Glaucoma, Chronic sinus] [N/A:N/A] Comorbid History: [9:problems/congestion, Anemia, Sleep Apnea, Hypertension, Type II Diabetes, Rheumatoid Arthritis, Neuropathy 06/18/2018] [N/A:N/A] Date Acquired: [9:15] [  N/A:N/A] Weeks of Treatment: [9:Open] [N/A:N/A] Wound Status: [9:3.5x2.9x0.1] [N/A:N/A] Measurements L x W x D (cm) [9:7.972] [N/A:N/A] A (cm) : rea [9:0.797] [N/A:N/A] Volume (cm) : [9:27.50%] [N/A:N/A] % Reduction in A rea: [9:91.90%] [N/A:N/A] % Reduction in Volume: [9:Grade 2] [N/A:N/A] Classification: [9:Large] [N/A:N/A] Exudate A mount: [9:Serosanguineous] [N/A:N/A] Exudate Type: [9:red, brown] [N/A:N/A] Exudate Color: [9:Thickened] [N/A:N/A] Wound Margin: [9:Large (67-100%)] [N/A:N/A] Granulation A mount: [9:Red, Pink, Hyper-granulation] [N/A:N/A] Granulation Quality: [9:None Present (0%)] [N/A:N/A] Necrotic A mount: [9:Fat Layer (Subcutaneous Tissue)] [N/A:N/A] Exposed Structures: [9:Exposed: Yes Fascia: No Tendon: No Muscle: No Joint: No Bone: No Small (1-33%)] [N/A:N/A] Epithelialization: [9:T Contact Cast otal] [N/A:N/A] Treatment Notes Electronic Signature(s) Signed: 05/16/2019 5:41:26 PM By: Baltazar Najjar MD Signed: 06/10/2019 1:30:32 PM By: Shawn Stall Entered By: Baltazar Najjar on 05/16/2019 13:59:54 -------------------------------------------------------------------------------- Multi-Disciplinary Care Plan Details Patient Name: Date of Service: [facility], [name] 05/16/2019 12:30 PM Medical Record Number: [id number] Patient Account Number: [id number] Date of  Birth/Sex: Treating RN: 05-May-1955 (64 y.o. [name] Primary Care Martyn Timme: MA SO UDI, Judene Companion MA LSA DA T Other Clinician: Referring Dessiree Sze: Treating Garek Schuneman/Extender: Baltazar Najjar MA SO UDI, ELHA MA LSA DA T Weeks in Treatment: 15 Active Inactive Wound/Skin Impairment Nursing Diagnoses: Impaired tissue integrity Knowledge deficit related to ulceration/compromised skin integrity Goals: Patient/caregiver will verbalize understanding of skin care regimen Date Initiated: 01/28/2019 Target Resolution Date: 06/14/2019 Goal Status: Active Interventions: Assess patient/caregiver ability to obtain necessary supplies Assess patient/caregiver ability to perform ulcer/skin care regimen upon admission and as needed Assess ulceration(s) every visit Provide education on ulcer and skin care Notes: Electronic Signature(s) Signed: 05/16/2019 5:33:37 PM By: Shawn Stall Signed: 06/10/2019 1:30:32 PM By: Shawn Stall Signed: 06/10/2019 1:30:32 PM By: Shawn Stall Entered By: Shawn Stall on 05/16/2019 13:44:57 -------------------------------------------------------------------------------- Pain Assessment Details Patient Name: Date of Service: ENO [facility], Zyanya J. 05/16/2019 12:30 PM Medical Record Number: [id number] Patient Account Number: [id number] Date of Birth/Sex: Treating RN: 04-Feb-1955 (64 y.o. [name] Primary Care Barnell Shieh: MA SO UDI, [place] MA LSA DA T Other Clinician: Referring Omeed Osuna: Treating Pegi Milazzo/Extender: Baltazar Najjar MA SO UDI, ELHA MA LSA DA T Weeks in Treatment: 15 Active Problems Location of Pain Severity and Description of Pain Patient Has Paino No Site Locations Pain Management and Medication Current Pain Management: Electronic Signature(s) Signed: 05/16/2019 5:31:56 PM By: Zandra Abts RN, BSN Entered By: Zandra Abts on 05/16/2019  13:23:52 -------------------------------------------------------------------------------- Patient/Caregiver Education Details Patient Name: Date of Service: Gypsy Decant 4/29/2021andnbsp12:30 PM Medical Record Number: [id number] Patient Account Number: [id number] Date of Birth/Gender: Treating RN: 06/14/1955 (64 y.o. [name] Primary Care Physician: MA SO UDI, Judene Companion MA LSA DA T Other Clinician: Referring Physician: Treating Physician/Extender: Baltazar Najjar MA SO UDI, ELHA MA LSA DA T Weeks in Treatment: 15 Education Assessment Education Provided To: Patient Education Topics Provided Wound/Skin Impairment: Handouts: Skin Care Do's and Dont's Methods: Explain/Verbal Responses: Reinforcements needed Electronic Signature(s) Signed: 05/16/2019 5:33:37 PM By: Shawn Stall Entered By: Shawn Stall on 05/16/2019 13:45:11 -------------------------------------------------------------------------------- Wound Assessment Details Patient Name: Date of Service: [place], WAHLER. 05/16/2019 12:30 PM Medical Record Number: [id number] Patient Account Number: [id number] Date of Birth/Sex: Treating RN: 17-Mar-1955 (64 y.o. [name] Primary Care Dvaughn Fickle: MA SO UDI, [place] MA LSA DA T Other Clinician: Referring Azadeh Hyder: Treating Heinz Eckert/Extender: Baltazar Najjar MA SO UDI, ELHA MA LSA DA T Weeks in Treatment: 15 Wound Status Wound Number: 9 Primary Diabetic Wound/Ulcer of the Lower Extremity Etiology: Wound Location: Right, Medial Foot Wound Open Wounding  Event: Blister Status: Date Acquired: 06/18/2018 Comorbid Glaucoma, Chronic sinus problems/congestion, Anemia, Sleep Weeks Of Treatment: 15 History: Apnea, Hypertension, Type II Diabetes, Rheumatoid Arthritis, Clustered Wound: No Neuropathy Wound Measurements Length: (cm) 3.5 Width: (cm) 2.9 Depth: (cm) 0.1 Area: (cm) 7.972 Volume: (cm) 0.797 % Reduction in Area: 27.5% % Reduction in Volume:  91.9% Epithelialization: Small (1-33%) Tunneling: No Undermining: No Wound Description Classification: Grade 2 Wound Margin: Thickened Exudate Amount: Large Exudate Type: Serosanguineous Exudate Color: red, brown Foul Odor After Cleansing: No Slough/Fibrino No Wound Bed Granulation Amount: Large (67-100%) Exposed Structure Granulation Quality: Red, Pink, Hyper-granulation Fascia Exposed: No Necrotic Amount: None Present (0%) Fat Layer (Subcutaneous Tissue) Exposed: Yes Tendon Exposed: No Muscle Exposed: No Joint Exposed: No Bone Exposed: No Electronic Signature(s) Signed: 05/16/2019 5:31:56 PM By: Levan Hurst RN, BSN Entered By: Levan Hurst on 05/16/2019 13:24:47 -------------------------------------------------------------------------------- Vitals Details Patient Name: Date of Service: ENO [place], Rilynn J. 05/16/2019 12:30 PM Medical Record Number: [id number] Patient Account Number: [id number] Date of Birth/Sex: Treating RN: October 05, 1955 (64 y.o. [name] Primary Care Zurich Carreno: MA SO UDI, [place] MA LSA DA T Other Clinician: Referring Sydnei Ohaver: Treating Arrin Ishler/Extender: Linton Ham MA SO UDI, ELHA MA LSA DA T Weeks in Treatment: 15 Vital Signs Time Taken: 12:58 Temperature (F): 98.0 Height (in): 68 Pulse (bpm): 70 Weight (lbs): 265 Respiratory Rate (breaths/min): 18 Body Mass Index (BMI): 40.3 Blood Pressure (mmHg): 138/66 Capillary Blood Glucose (mg/dl): 134 Reference Range: 80 - 120 mg / dl Notes glucose per pt report Electronic Signature(s) Signed: 05/16/2019 5:31:56 PM By: Levan Hurst RN, BSN Entered By: Levan Hurst on 05/16/2019 12:58:28

## 2019-06-10 NOTE — Progress Notes (Signed)
[name], STANDEN ([id number]) Visit Report for 05/23/2019 Arrival Information Details Patient Name: Date of Service: [name], VANZANDT 05/23/2019 12:45 PM Medical Record Number: [id number] Patient Account Number: [id number] Date of Birth/Sex: Treating RN: [birth year] (64 y.o. F) Epps, Carrie Primary Care Sindia Kowalczyk: MA SO UDI, [place] MA LSA DA T Other Clinician: Referring Adebayo Ensminger: Treating Deangelo Berns/Extender: Baltazar Najjar MA SO UDI, ELHA MA LSA DA T Weeks in Treatment: 16 Visit Information History Since Last Visit All ordered tests and consults were completed: No Patient Arrived: Crutches Added or deleted any medications: No Arrival Time: 12:41 Any new allergies or adverse reactions: No Accompanied By: self Had a fall or experienced change in No Transfer Assistance: None activities of daily living that may affect Patient Identification Verified: Yes risk of falls: Secondary Verification Process Completed: Yes Signs or symptoms of abuse/neglect since last visito No Patient Requires Transmission-Based Precautions: No Hospitalized since last visit: No Patient Has Alerts: No Implantable device outside of the clinic excluding No cellular tissue based products placed in the center since last visit: Has Dressing in Place as Prescribed: Yes Has Compression in Place as Prescribed: Yes Pain Present Now: No Electronic Signature(s) Signed: 05/23/2019 5:04:33 PM By: Yevonne Pax RN Entered By: Yevonne Pax on 05/23/2019 12:41:41 -------------------------------------------------------------------------------- Lower Extremity Assessment Details Patient Name: Date of Service: [name], RADY 05/23/2019 12:45 PM Medical Record Number: [id number] Patient Account Number: [id number] Date of Birth/Sex: Treating RN: 1955/12/03 (64 y.o. [name] Primary Care Tu Shimmel: MA SO UDI, John Sevier MA LSA DA T Other Clinician: Referring Khalib Fendley: Treating Yitzchok Carriger/Extender: Baltazar Najjar MA SO UDI,  ELHA MA LSA DA T Weeks in Treatment: 16 Edema Assessment Assessed: [Left: No] [Right: No] Edema: [Left: Yes] [Right: Yes] Calf Left: Right: Point of Measurement: 43 cm From Medial Instep 38 cm 37 cm Ankle Left: Right: Point of Measurement: 12 cm From Medial Instep 23 cm 27 cm Electronic Signature(s) Signed: 05/23/2019 5:04:33 PM By: Yevonne Pax RN Entered By: Yevonne Pax on 05/23/2019 12:57:59 -------------------------------------------------------------------------------- Multi Wound Chart Details Patient Name: Date of Service: [name], [name] 05/23/2019 12:45 PM Medical Record Number: [id number] Patient Account Number: [id number] Date of Birth/Sex: Treating RN: [birth year] (64 y.o. [name] Primary Care Johnnetta Holstine: MA SO UDI, [place] MA LSA DA T Other Clinician: Referring Lanaysia Fritchman: Treating Jawanza Zambito/Extender: Baltazar Najjar MA SO UDI, ELHA MA LSA DA T Weeks in Treatment: 16 Vital Signs Height(in): 68 Pulse(bpm): 70 Weight(lbs): 265 Blood Pressure(mmHg): 120/60 Body Mass Index(BMI): 40 Temperature(F): 98.2 Respiratory Rate(breaths/min): 18 Photos: [9:No Photos Right, Medial Foot] [N/A:N/A N/A] Wound Location: [9:Blister] [N/A:N/A] Wounding Event: [9:Diabetic Wound/Ulcer of the Lower] [N/A:N/A] Primary Etiology: [9:Extremity 06/18/2018] [N/A:N/A] Date Acquired: [9:16] [N/A:N/A] Weeks of Treatment: [9:Open] [N/A:N/A] Wound Status: [9:3.6x2.6x0.3] [N/A:N/A] Measurements L x W x D (cm) [9:7.351] [N/A:N/A] A (cm) : rea [9:2.205] [N/A:N/A] Volume (cm) : [9:33.10%] [N/A:N/A] % Reduction in Area: [9:77.70%] [N/A:N/A] % Reduction in Volume: [9:Grade 2] [N/A:N/A] Classification: [9:T Contact Cast otal] [N/A:N/A] Treatment Notes Electronic Signature(s) Signed: 05/23/2019 5:30:42 PM By: Baltazar Najjar MD Signed: 06/10/2019 1:34:25 PM By: Shawn Stall Entered By: Baltazar Najjar on 05/23/2019  13:25:22 -------------------------------------------------------------------------------- Multi-Disciplinary Care Plan Details Patient Name: Date of Service: [name] ([address]), [name] 05/23/2019 12:45 PM Medical Record Number: [id number] Patient Account Number: [id number] Date of Birth/Sex: Treating RN: 14-Aug-1955 (64 y.o. Debara Pickett, Millard.Loa Primary Care Lynnix Schoneman: MA Hedy Jacob MA LSA DA T Other Clinician: Referring Adria Costley: Treating Tanveer Dobberstein/Extender: Baltazar Najjar MA SO UDI, ELHA MA LSA DA T Weeks in Treatment:  16 Active Inactive Wound/Skin Impairment Nursing Diagnoses: Impaired tissue integrity Knowledge deficit related to ulceration/compromised skin integrity Goals: Patient/caregiver will verbalize understanding of skin care regimen Date Initiated: 01/28/2019 Target Resolution Date: 06/14/2019 Goal Status: Active Interventions: Assess patient/caregiver ability to obtain necessary supplies Assess patient/caregiver ability to perform ulcer/skin care regimen upon admission and as needed Assess ulceration(s) every visit Provide education on ulcer and skin care Notes: Electronic Signature(s) Signed: 05/23/2019 5:34:26 PM By: Deon Pilling Signed: 06/10/2019 1:34:25 PM By: Deon Pilling Entered By: Deon Pilling on 05/23/2019 12:58:16 -------------------------------------------------------------------------------- Pain Assessment Details Patient Name: Date of Service: [name], [name]. 05/23/2019 12:45 PM Medical Record Number: [id number] Patient Account Number: [id number] Date of Birth/Sex: Treating RN: September 01, 1955 (64 y.o. Orvan Falconer Primary Care Nohelani Benning: MA SO UDI, [place] MA LSA DA T Other Clinician: Referring Jerrad Mendibles: Treating Kaileb Monsanto/Extender: Linton Ham MA SO UDI, ELHA MA LSA DA T Weeks in Treatment: 16 Active Problems Location of Pain Severity and Description of Pain Patient Has Paino No Site Locations Pain Management and Medication Current Pain  Management: Electronic Signature(s) Signed: 05/23/2019 5:04:33 PM By: Carlene Coria RN Entered By: Carlene Coria on 05/23/2019 12:42:40 -------------------------------------------------------------------------------- Patient/Caregiver Education Details Patient Name: Date of Service: [name] 5/6/2021andnbsp12:45 PM Medical Record Number: [id number] Patient Account Number: [id number] Date of Birth/Gender: Treating RN: 05-27-1955 (64 y.o. [name] Primary Care Physician: MA SO UDI, Willette Pa MA LSA DA T Other Clinician: Referring Physician: Treating Physician/Extender: Linton Ham MA SO UDI, ELHA MA LSA DA T Weeks in Treatment: 16 Education Assessment Education Provided To: Patient Education Topics Provided Wound/Skin Impairment: Handouts: Skin Care Do's and Dont's Methods: Explain/Verbal Responses: Reinforcements needed Electronic Signature(s) Signed: 05/23/2019 5:34:26 PM By: Deon Pilling Entered By: Deon Pilling on 05/23/2019 12:58:34 -------------------------------------------------------------------------------- Wound Assessment Details Patient Name: Date of Service: [name], [name]. 05/23/2019 12:45 PM Medical Record Number: [id number] Patient Account Number: [id number] Date of Birth/Sex: Treating RN: 1955-06-01 (64 y.o. [name] Primary Care Sua Spadafora: MA SO UDI, [place] MA LSA DA T Other Clinician: Referring Keion Neels: Treating Jamyron Redd/Extender: Linton Ham MA SO UDI, ELHA MA LSA DA T Weeks in Treatment: 16 Wound Status Wound Number: [id number] Primary Etiology: Diabetic Wound/Ulcer of the Lower Extremity Wound Location: Right, Medial Foot Wound Status: Open Wounding Event: Blister Date Acquired: 06/18/2018 Weeks Of Treatment: 16 Clustered Wound: No Photos Photo Uploaded By: Mikeal Hawthorne on 05/24/2019 09:52:03 Wound Measurements Length: (cm) 3.6 Width: (cm) 2.6 Depth: (cm) 0.3 Area: (cm) 7.351 Volume: (cm) 2.205 % Reduction in Area:  33.1% % Reduction in Volume: 77.7% Wound Description Classification: Grade 2 Electronic Signature(s) Signed: 05/23/2019 5:34:26 PM By: Deon Pilling Signed: 06/10/2019 1:34:25 PM By: Deon Pilling Entered By: Deon Pilling on 05/23/2019 13:16:37 -------------------------------------------------------------------------------- Vitals Details Patient Name: Date of Service: Sarah [place], [name]. 05/23/2019 12:45 PM Medical Record Number: [id number] Patient Account Number: [id number] Date of Birth/Sex: Treating RN: August 18, 1955 (64 y.o. Voncille Lo, [place] Mel Primary Care Jaskirat Zertuche: MA SO UDI, [place] MA LSA DA T Other Clinician: Referring Kida Digiulio: Treating Issai Werling/Extender: Linton Ham MA SO UDI, ELHA MA LSA DA T Weeks in Treatment: 16 Vital Signs Time Taken: 12:41 Temperature (F): 98.2 Height (in): 68 Pulse (bpm): 70 Weight (lbs): 265 Respiratory Rate (breaths/min): 18 Body Mass Index (BMI): 40.3 Blood Pressure (mmHg): 120/60 Reference Range: 80 - 120 mg / dl Electronic Signature(s) Signed: 05/23/2019 5:04:33 PM By: Carlene Coria RN Entered By: Carlene Coria on 05/23/2019 12:42:32

## 2019-06-11 ENCOUNTER — Telehealth: Payer: Self-pay

## 2019-06-11 NOTE — Telephone Encounter (Signed)
COVID-19 Pre-Screening Questions:06/11/19  Do you currently have a fever (>100 F), chills or unexplained body aches? NO  Are you currently experiencing new cough, shortness of breath, sore throat, runny nose? NO .  Have you recently travelled outside the state of [place] in the last 14 days? NO .  Have you been in contact with someone that is currently pending confirmation of Covid19 testing or has been confirmed to have the Covid19 virus? NO  **If the patient answers NO to ALL questions -  advise the patient to please call the clinic before coming to the office should any symptoms develop.     

## 2019-06-12 ENCOUNTER — Other Ambulatory Visit: Payer: Self-pay

## 2019-06-12 ENCOUNTER — Ambulatory Visit (INDEPENDENT_AMBULATORY_CARE_PROVIDER_SITE_OTHER): Payer: Medicare Other | Admitting: Internal Medicine

## 2019-06-12 ENCOUNTER — Encounter: Payer: Self-pay | Admitting: Internal Medicine

## 2019-06-12 DIAGNOSIS — M86372 Chronic multifocal osteomyelitis, left ankle and foot: Secondary | ICD-10-CM

## 2019-06-12 NOTE — Progress Notes (Signed)
[facility]  Patient Active Problem List   Diagnosis Date Noted  . Osteomyelitis of left foot (HCC) 03/02/2019    Priority: High  . Puncture wound of foot 08/08/2018  . Right ankle swelling 02/08/2018  . Obesity 05/18/2017  . Hyperlipidemia 11/12/2014  . Charcot foot due to diabetes mellitus (HCC) 11/06/2014  . CKD (chronic kidney disease) stage 3, GFR 30-59 ml/min 02/02/2013  . GERD (gastroesophageal reflux disease) 03/09/2012  . Routine adult health maintenance 05/26/2010  . Obstructive sleep apnea 05/26/2010  . Normocytic anemia 01/12/2010  . Diabetes mellitus type II, controlled (HCC) 06/17/2009  . Essential hypertension 06/17/2009    Patient's Medications  New Prescriptions   No medications on file  Previous Medications   ACCU-CHEK SOFTCLIX LANCETS LANCETS    Use to check blood glucose up to three times a day. Dx code E 11.9   ACETAMINOPHEN (TYLENOL) 500 MG TABLET    Take 500-1,000 mg by mouth every 6 (six) hours as needed for moderate pain or headache.   ASPIRIN EC 81 MG TABLET    Take 1 tablet (81 mg total) by mouth daily.   ATORVASTATIN (LIPITOR) 40 MG TABLET    TAKE ONE TABLET BY MOUTH DAILY   BIOTIN 1000 MCG TABLET    Take 1,000 mcg by mouth daily.   BRIMONIDINE-TIMOLOL (COMBIGAN) 0.2-0.5 % OPHTHALMIC SOLUTION    Place 1 drop into both eyes every 12 (twelve) hours. Place one drop to both eyes twice daily for glaucoma   CALCIUM-VITAMIN D (OSCAL-500) 500-400 MG-UNIT TABLET    Take 1 tablet by mouth 2 (two) times daily.    CEFTRIAXONE (ROCEPHIN) 10 G INJECTION       CHOLECALCIFEROL (VITAMIN D3) 25 MCG (1000 UT) CAPS    Take 1 capsule by mouth daily.   CYCLOSPORINE (RESTASIS) 0.05 % OPHTHALMIC EMULSION    Place 1 drop into both eyes 2 (two) times daily.   EMOLLIENT (BAG BALM EX)    Apply 1 application topically daily.   FERROUS SULFATE 325 (65 FE) MG TABLET    Take 1 tablet (325 mg total) by mouth daily with breakfast.   GLUCOSE BLOOD  (ACCU-CHEK AVIVA) TEST STRIP    Use to check blood glucose up to three times a day. Dx code E 11.9   [name]    TAKE ONE TABLET BY MOUTH TWICE A DAY   LISINOPRIL (ZESTRIL) 10 MG TABLET    TAKE ONE TABLET BY MOUTH DAILY   MULTIPLE VITAMIN (MULTIVITAMIN) TABLET    Take 1 tablet by mouth daily.    NETARSUDIL-LATANOPROST (ROCKLATAN) 0.02-0.005 % SOLN    Apply 1 drop to eye daily at 10 pm.   PANTOPRAZOLE (PROTONIX) 20 MG TABLET    1 tablet twice a day   POLYVINYL ALCOHOL-POVIDONE (REFRESH OP)    Place 1 drop into both eyes daily as needed (dry eyes).   VITAMIN B-12 (CYANOCOBALAMIN) 1000 MCG TABLET    Take 1,000 mcg by mouth daily.   VITAMIN C (ASCORBIC ACID) 500 MG TABLET    Take 500 mg by mouth daily.   ZINC 50 MG CAPS    Take 50 mg by mouth daily.   Modified Medications   No medications on file  Discontinued Medications   CEPHALEXIN (KEFLEX) 500 MG CAPSULE    Take 1 capsule (500 mg total) by mouth 2 (two) times daily.   METRONIDAZOLE (FLAGYL) 500 MG TABLET    Take  1 tablet (500 mg total) by mouth 3 (three) times daily.    Subjective: [name] is in for her routine follow-up visit.  She has diabetes and neuropathy complicated by ulcers on both feet for several months.  She suffers with Charcot arthropathy.  Notes indicate that there has been exposed bone on both feet recently. 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 The MRI of her left foot showed cortical irregularity and edema of the calcaneus concerning for osteomyelitis.  She underwent bone biopsy on 03/04/2019.  No organisms were seen on stain but cultures grew Citrobacter koseri, strep mitis/oralis and strep anginosis. Pathology review showed only benign reactive changes.  Her C-reactive protein was elevated at 21.  I elected to start her on IV ceftriaxone and oral metronidazole.  She completed 8 weeks of therapy on 05/14/2019 before switching to oral cephalexin and oral  metronidazole.  She has now completed 12 weeks of total antibiotic therapy.  She feels like she is doing much better.  When she was seen at the wound center last week she had a full contact cast placed on her left foot.  The plantar ulcer on her right foot is healing nicely.  She has had some anorexia and nausea that she relates to her antibiotics.  She has not had any fever or diarrhea.    Review of Systems: Review of Systems  Constitutional: Negative for chills, diaphoresis and fever.  Gastrointestinal: Positive for nausea. Negative for abdominal pain, diarrhea and vomiting.  Musculoskeletal: Negative for joint pain.    Past Medical History:  Diagnosis Date  . Chronic normocytic anemia   . CKD (chronic kidney disease) stage 3, GFR 30-59 ml/min 02/02/2013  . Diabetes mellitus type II, controlled ([place]) 06/17/2009  . Essential hypertension 06/17/2009  . GERD (gastroesophageal reflux disease)   . Glaucoma   . H/O hiatal hernia   . Hyperlipidemia   . MRSA (methicillin resistant Staphylococcus aureus)   . Obstructive sleep apnea 05/26/2010    Social History   Tobacco Use  . Smoking status: Former Smoker    Packs/day: 0.50    Years: 0.00    Pack years: 0.00    Types: Cigarettes    Quit date: 12/22/1972    Years since quitting: 46.5  . Smokeless tobacco: Never Used  Substance Use Topics  . Alcohol use: No  . Drug use: No    Family History  Problem Relation Age of Onset  . Diabetes Mother   . Hypertension Mother   . Dementia Mother   . Heart disease Father   . Stroke Father   . Diabetes Father   . Hypertension Father   . Gout Father   . Deep vein thrombosis Father   . Breast cancer Cousin   . Breast cancer Cousin     Allergies  Allergen Reactions  . Codeine Itching    swelling in throat  . Pravastatin Other (See Comments)    cramps  . Latex Rash    Objective: Vitals:   06/12/19 1438  BP: 116/72  Pulse: 82  Temp: 98 F (36.7 C)  SpO2: 98%  Weight: 246 lb  (111.6 kg)  Height: 5\' 9"  (1.753 m)   Body mass index is 36.33 kg/m.  Physical Exam Constitutional:      Comments: She is in good spirits.  Musculoskeletal:     Comments: Her left foot is casted.  The plantar ulcer on her right foot is smaller and much less  deep.  She has some serosanguineous drainage on the gauze dressing.  There is no malodor.    Sed Rate (mm/hr)  Date Value  06/06/2019 76 (H)  01/30/2013 54 (H)  06/14/2009 60 (H)   CRP (mg/L)  Date Value  06/06/2019 13 (H)  02/28/2019 21 (H)  08/30/2018 51 (H)         Problem List Items Addressed This Visit      High   Osteomyelitis of left foot (HCC)    Although her inflammatory markers remain elevated I am hopeful that her left foot osteomyelitis and wound infections have been cured.  I am concerned about the possibility of adverse side effects of her antibiotics if we continue them much longer.  I asked her to stop both cephalexin and metronidazole now. She will follow-up here in 6 weeks.          Cliffton Asters, MD [facility] [phone] pager   [phone] cell 06/12/2019, 2:58 PM

## 2019-06-12 NOTE — Assessment & Plan Note (Signed)
Although her inflammatory markers remain elevated I am hopeful that her left foot osteomyelitis and wound infections have been cured.  I am concerned about the possibility of adverse side effects of her antibiotics if we continue them much longer.  I asked her to stop both cephalexin and metronidazole now. She will follow-up here in 6 weeks.

## 2019-06-13 ENCOUNTER — Encounter (HOSPITAL_BASED_OUTPATIENT_CLINIC_OR_DEPARTMENT_OTHER): Payer: Medicare Other | Admitting: Internal Medicine

## 2019-06-13 DIAGNOSIS — E11621 Type 2 diabetes mellitus with foot ulcer: Secondary | ICD-10-CM | POA: Diagnosis not present

## 2019-06-13 NOTE — Progress Notes (Signed)
ALVAH, GILDER ([id number]) Visit Report for 06/10/2019 Debridement Details Patient Name: Date of Service: [name], [name] 06/10/2019 1:30 PM Medical Record Number: [id number] Patient Account Number: [id number] Date of Birth/Sex: Treating RN: Apr 26, 1955 (64 y.o. [name] Primary Care Provider: MA SO UDI, ELHA MA LSA DA T Other Clinician: Referring Provider: Treating Provider/Extender: Cassandria Anger MA SO UDI, ELHA MA LSA DA T Weeks in Treatment: 19 Debridement Performed for Assessment: Wound #10 Left,Lateral,Plantar Foot Performed By: Physician Cassandria Anger, MD Debridement Type: Debridement Severity of Tissue Pre Debridement: Fat layer exposed Level of Consciousness (Pre-procedure): Awake and Alert Pre-procedure Verification/Time Out Yes - 14:32 Taken: Start Time: 14:32 T Area Debrided (L x W): otal 1.2 (cm) x 1.6 (cm) = 1.92 (cm) Tissue and other material debrided: Viable, Non-Viable, Slough, Subcutaneous, Slough Level: Skin/Subcutaneous Tissue Debridement Description: Excisional Instrument: Curette Bleeding: Minimum Hemostasis Achieved: Pressure End Time: 14:33 Procedural Pain: 0 Post Procedural Pain: 0 Response to Treatment: Procedure was tolerated well Level of Consciousness (Post- Awake and Alert procedure): Post Debridement Measurements of Total Wound Length: (cm) 1.2 Width: (cm) 1.6 Depth: (cm) 1.1 Volume: (cm) 1.659 Character of Wound/Ulcer Post Debridement: Improved Severity of Tissue Post Debridement: Fat layer exposed Post Procedure Diagnosis Same as Pre-procedure Electronic Signature(s) Signed: 06/10/2019 5:12:22 PM By: Zandra Abts RN, BSN Signed: 06/13/2019 4:57:23 PM By: Cassandria Anger MD, MBA Entered By: Zandra Abts on 06/10/2019 14:34:49 -------------------------------------------------------------------------------- HPI Details Patient Name: Date of Service: [name], [name] 06/10/2019 1:30 PM Medical Record Number:  [id number] Patient Account Number: [id number] Date of Birth/Sex: Treating RN: 26-Jul-1955 (64 y.o. [name] Primary Care Provider: MA SO UDI, ELHA MA LSA DA T Other Clinician: Referring Provider: Treating Provider/Extender: Cassandria Anger MA SO UDI, ELHA MA LSA DA T Weeks in Treatment: 12 History of Present Illness HPI Description: 64 yrs old bf here for follow up recurrent left charcot foot ulcer. She has DM. She is not a smoker. She states a blister developed 3 months ago at site of previous breakdown from 1 year ago. It was debrided at the podiatrist's office. She went to the ER and then sent here. She denies pain. Xray was negative for osteo. Culture from this clinic negative. 09/08/14 [name] to [name] for Charcot foot. Seen by him and MRI scheduled 09/19/14. Prior silver collagen but this was not ordered last visit, patient has been rinsing with saline alone. Re institute silver collagen every other day. F/u 2 weeks with [name] as Labor Day holiday. Supplies ordered. 09/25/14; this is a patient with a Charcot foot on the left. she has a wound over the plantar aspect of her left calcaneus. She has been seen by [name] of orthopedic surgery and has had an MRI. She is apparently going within the next week or 2 for corrective surgery which will involve an external fixator. I don't have any of the details of this. 10/06/14; The patient is a 64 yrs old bf with a left Charcot foot and ulcer on the plantar. 12/01/14 Returns post surgery with [name]. Has follow up visit later this week with him, currently in facility and NWB over this extremity. States no drainage from foot and no current wound care. On exam callus and scab in place, suspect healed. Has external fixator in place. Will defer to [name] for management, ok to place moisturizing cream over scabs and callus and she will call if she requires follow up here. READMISSION 01/28/2019 Patient was in  the  clinic in 2016 for a wound on the plantar aspect of her left calcaneus. Predominantly cared for by Dr. [name] she was referred to [name] and had corrective surgery for the position of her left ankle I believe. She had previously been here in 2015 and then prior to that in 2011 2012 that I do not have these records. More recently she has developed fairly extensive wounds on the right medial foot, left lateral foot and a small area on the right medial great toe. Right medial foot wound has been there for 6 to 7 months, left foot wound for 2 to 3 months and the right great toe only over the last few weeks. She has been followed by [name] at Dr. [name] office it sounds as though she has been undergoing callus paring and silver nitrate. The patient has been washing these off with soap and water and plan applying dry dressing Past medical history, type 2 diabetes well controlled with a recent hemoglobin A1c of 7 on 11/16, type 2 diabetes with peripheral neuropathy, previous left Charcot joint surgery bilateral Charcot joints currently, stage III chronic renal failure, hypertension, obstructive sleep apnea, history of MRSA and gastroesophageal reflux. 1/18; this is a patient with bilateral Charcot feet. Plain x-rays that I did of both of these wounds that are either at bone or precariously close to bone were done. On the right foot this showed possible lytic destruction involving the anterior aspect of the talus which may represent a wound osteomyelitis. An MRI was recommended. On the left there was no definite lytic destruction although the wound is deep undermining's and I think probably requires an MRI on this basis in any case 1/25; patient was supposed to have her MRI last Friday of her bilateral feet however they would not do it because there was silver alginate in her dressings, will replace with calcium alginate today and will see if we can get her done today 2/1; patient did not get  her MRIs done last week because of issues with the MRI department receiving orders to do bilateral feet. She has 2 deep wounds in the setting of Charcot feet deformities bilaterally. Both of them at one point had exposed bone although I had trouble demonstrating that today. I am not sure that we can offload these very aggressively as I watched her leave the clinic last week her gait is very narrow and unsteady. 2/15; the MRI of her right foot did not show osteomyelitis potentially osteonecrosis of the talus. 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 The wounds are a large wound on the right medial foot and on the left a substantial wound on the left lateral plantar calcaneus. We have been using silver alginate. Ultimately I think we need to consider a total contact cast on the right while we work through the possibility of osteomyelitis in the left. I watched her walk out with her crutches I have some trepidation about the ability to walk with the cast on her right foot. Nevertheless with her Charcot deformities I do not think there is a way to heal these short of offloading them aggressively. 2/22; the culture of the bone in the left foot that I did last week showed a few Citrobacter Koseri as well as some streptococcal species. In my attempted bone for pathology did not actually yield a lot of bone the pathologist did not identify any osteomyelitis. 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 Is promised today and aggressive debridement of the right foot and a total contact cast which surprisingly she seemed to tolerate quite well 03/13/2019 upon evaluation today patient appears to be doing well with her total contact cast she is here for the first cast change. She had no areas of rubbing or any other  complications at this point. Overall things are doing quite well. 2/26; the patient was kindly seen by Dr. Megan Salon of infectious disease on 2/25. She is now on IV vancomycin PICC line placement in 2 days and oral Flagyl. This was in response to her MRI showing osteomyelitis of the left calcaneus concerning for osteomyelitis. We have been putting her right leg in a total contact cast. Our nurses report drainage. She is tolerating the total contact cast well. 3/4; the patient is started her IV antibiotics and is taking IV vancomycin and oral Flagyl. She appears to be tolerating these both well. This is for osteomyelitis involving the left calcaneus. The area on the right in the setting of bilateral Charcot deformities did not have any bone infection on MRI. We put a total contact cast on her with silver alginate as the primary dressing and she returns today in follow-up. Per our intake nurse there was too much drainage to leave this on all week therefore we will bring her back for an early change 3/8; follow-up total contact cast she is still having a lot of drainage probably too much drainage from the right foot wound will week with the same cast. She will be back on Thursday. The area on the left looks somewhat better 3/11; patient here for a total contact cast change. 3/15; patient came in for wound care evaluation. She is still on IV vancomycin and oral Flagyl for osteomyelitis in the left foot. The area on the right foot we are putting in a total contact cast. 3/18 for total contact cast change on the right foot 3/22; the patient was here for wound review today. The area on the right foot is slightly smaller in terms of surface area. However the surface of the wound is very gritty and hyper granulated. More problematically the area on the plantar left foot has increased depth indeed in the center of this it probes to bone. We have been using Hydrofera Blue in both areas. She is on antibiotics as  prescribed by infectious disease IV vancomycin and oral Flagyl 3/25; the patient is in for total contact cast change. Our intake nurse was concerned about the amount of drainage which soaked right to the multiple layers we are putting on this. Wondered about options. The wound bed actually looks as healthy as this is looked although there may not be a lot of change in dimensions things are looking a lot better. If we are going to heal this woman's wound which is in the middle of her right Charcot foot this is going to need to be offloaded. There are not many alternatives 3/29; still not a lot of improvement although the surface of the right wound looks better. We have been using Hydrofera Blue 04/17/2019 upon evaluation today patient appears to be doing well with a total contact cast. With actually having to change this twice a week. She saw Dr. Dellia Nims for wound care earlier in the week this is for the cast change today. That is  due to the fact that she has significant drainage. Overall the wound is been looking excellent however. 4/5; we continue to put a total contact cast on this patient with Hydrofera Blue. Still a lot of drainage which precludes a simple weekly change or changing this twice a week. 4/8; total contact cast changed today. Apparently the wound looks better per our intake nurse 4/12; patient here for wound care evaluation. Wounds on the right foot have not changed in dimension none about a month left foot looks similar. Apparently her antibiotics that we are giving her for osteomyelitis in the left foot complete on Wednesday. We have been using Hydrofera Blue on the right foot under a total contact cast. Nurses report greenish drainage although I think this may be discoloration from the [facility] 4/15; back for cast change wounds look quite good although still moderate amount of drainage. T contact cast reapplied otal 4/19;. Wound evaluation. Wound on the right foot is smaller  surface looks healthy. There is still the divot in the middle part of this wound but I could not feel any bone. Area on the left foot also looks better She has completed her antibiotics but still has the PICC line in. 4/23; patient comes back for a total contact cast change this was done in the standard fashion no issues 4/26; wound measures slightly larger on the right foot which is disappointing. She sees Dr. Orvan Falconer with regards to her antibiotics on Wednesday. I believe she is on vancomycin IV and oral Flagyl. 4/29; in for her routine total contact cast change. She saw Dr. Orvan Falconer this morning. He is asking about antibiotic continuation I will be in touch with him. I did not look at her wound today 5/3; patient saw Dr. Orvan Falconer on 4/28. I did send him a note asking about the continuation of perhaps an oral regimen. I have not heard back. She still has an elevated sedimentation rate at 81. Her wounds are smaller. 5/6; in for her total contact cast change. We did not look at the wound today. She has seen Dr. Orvan Falconer and is now on oral Keflex and Flagyl. She seems to be tolerating these well 5/10; she had her PICC line removed. The wound on the right foot after some initial improvement has not made any improvement even with the total contact cast. Still having a lot of drainage. Likewise the area on the left foot really is not any better either 5/14; in for a cast change today. We will look at the right foot on Monday. If this is not making any progress I am going to try to cast the left foot. 5/17; the wound on the right foot is absolutely no different. I am going to put ointment on this area and change the cast of the left foot. We have been using silver alginate there 5/21; apparently the area on the right foot was better per our intake nurse although I did not see it. We did a standard total contact cast change on the left foot we have been applying silver alginate to the wound  here. 06/10/19-Area on the right foot per dimensions slightly better, the left foot has still some depth applying silver alginate to the wound with TCC Electronic Signature(s) Signed: 06/10/2019 2:35:11 PM By: Cassandria Anger MD, MBA Entered By: Cassandria Anger on 06/10/2019 14:35:10 -------------------------------------------------------------------------------- Physical Exam Details Patient Name: Date of Service: [name], [name] 06/10/2019 1:30 PM Medical Record Number: [id number] Patient Account Number: [id number] Date of Birth/Sex:  Treating RN: 1955/05/07 (64 y.o. [name] Primary Care Provider: MA SO UDI, ELHA MA LSA DA T Other Clinician: Referring Provider: Treating Provider/Extender: Cassandria Anger MA SO UDI, ELHA MA LSA DA T Weeks in Treatment: 56 Constitutional alert and oriented x 3. sitting or standing blood pressure is within target range for patient.. supine blood pressure is within target range for patient.. pulse regular and within target range for patient.Marland Kitchen respirations regular, non-labored and within target range for patient.Marland Kitchen temperature within target range for patient.. . . Well- nourished and well-hydrated in no acute distress. Notes Left plantar wound has some depth, portion of devitalized tissue was removed using curette not much bleeding observed Thin callus rim noted Right foot plantar wound also with a thin callus rim base is almost flush with surrounding skin Electronic Signature(s) Signed: 06/10/2019 2:35:57 PM By: Cassandria Anger MD, MBA Entered By: Cassandria Anger on 06/10/2019 14:35:57 -------------------------------------------------------------------------------- Physician Orders Details Patient Name: Date of Service: [name], [name] 06/10/2019 1:30 PM Medical Record Number: [id number] Patient Account Number: [id number] Date of Birth/Sex: Treating RN: 08/21/1955 (64 y.o. [name] Primary Care Provider: MA SO UDI, ELHA MA LSA DA T  Other Clinician: Referring Provider: Treating Provider/Extender: Cassandria Anger MA SO UDI, ELHA MA LSA DA T Weeks in Treatment: 51 Verbal / Phone Orders: No Diagnosis Coding ICD-10 Coding Code Description E11.621 Type 2 diabetes mellitus with foot ulcer L97.524 Non-pressure chronic ulcer of other part of left foot with necrosis of bone L97.511 Non-pressure chronic ulcer of other part of right foot limited to breakdown of skin L97.514 Non-pressure chronic ulcer of other part of right foot with necrosis of bone E11.42 Type 2 diabetes mellitus with diabetic polyneuropathy M14.671 Charcot's joint, right ankle and foot M14.672 Charcot's joint, left ankle and foot 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 ppointment in: - Thursday for cast change Return A Dressing Change Frequency Wound #10 Left,Lateral,Plantar Foot Do not change entire dressing for one week. Wound #9 Right,Medial Foot Change Dressing every other day. Wound Cleansing May shower with protection. Primary Wound Dressing Wound #10 Left,Lateral,Plantar Foot Calcium Alginate with Silver Wound #9 Right,Medial Foot Polymem Silver Secondary Dressing Wound #10 Left,Lateral,Plantar Foot Foam Kerlix/Rolled Gauze Dry Gauze Drawtex Wound #9 Right,Medial Foot Kerlix/Rolled Gauze ABD pad Zetuvit or Kerramax Off-Loading Total Contact Cast to Left Lower Extremity Open toe surgical shoe to: - foot Electronic Signature(s) Signed: 06/10/2019 5:12:22 PM By: Zandra Abts RN, BSN Signed: 06/13/2019 4:57:23 PM By: Cassandria Anger MD, MBA Entered By: Zandra Abts on 06/10/2019 14:36:06 -------------------------------------------------------------------------------- Problem List Details Patient Name: Date of Service: [name], [name] 06/10/2019 1:30 PM Medical Record Number: [id number] Patient Account Number: [id number] Date of Birth/Sex: Treating  RN: 02/18/1955 (64 y.o. [name] Primary Care Provider: MA SO UDI, Judene Companion MA LSA DA T Other Clinician: Referring Provider: Treating Provider/Extender: Cassandria Anger MA SO UDI, ELHA MA LSA DA T Weeks in Treatment: 83 Active Problems ICD-10 Encounter Code Description Active Date MDM Diagnosis E11.621 Type 2 diabetes mellitus with foot ulcer 01/28/2019 No Yes L97.524 Non-pressure chronic ulcer of other part of left foot with necrosis of bone 01/28/2019 No Yes L97.511 Non-pressure chronic ulcer of other part of right foot limited to breakdown of 01/28/2019 No Yes skin L97.514 Non-pressure chronic ulcer of other part of right foot with necrosis of bone 01/28/2019 No Yes E11.42 Type 2 diabetes mellitus with diabetic polyneuropathy 01/28/2019 No Yes M14.671 Charcot's joint, right ankle and  foot 01/28/2019 No Yes M14.672 Charcot's joint, left ankle and foot 01/28/2019 No Yes 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 06/10/2019 1:30 PM Medical Record Number: [id number] Patient Account Number: [id number] Date of Birth/Sex: Treating RN: 03-23-1955 (64 y.o. [name] Primary Care Provider: MA SO UDI, ELHA MA LSA DA T Other Clinician: Referring Provider: Treating Provider/Extender: Cassandria Anger MA SO UDI, ELHA MA LSA DA T Weeks in Treatment: 19 Subjective History of Present Illness (HPI) 64 yrs old bf here for follow up recurrent left charcot foot ulcer. She has DM. She is not a smoker. She states a blister developed 3 months ago at site of previous breakdown from 1 year  ago. It was debrided at the podiatrist's office. She went to the ER and then sent here. She denies pain. Xray was negative for osteo. Culture from this clinic negative. 09/08/14 Referred by [name] to [name] for Charcot foot. Seen by him and MRI scheduled 09/19/14. Prior silver collagen but this was not ordered last visit, patient has been rinsing with saline alone. Re institute silver collagen every other day. F/u 2 weeks with [name] as Labor Day holiday. Supplies ordered. 09/25/14; this is a patient with a Charcot foot on the left. she has a wound over the plantar aspect of her left calcaneus. She has been seen by [name] of orthopedic surgery and has had an MRI. She is apparently going within the next week or 2 for corrective surgery which will involve an external fixator. I don't have any of the details of this. 10/06/14; The patient is a 64 yrs old bf with a left Charcot foot and ulcer on the plantar. 12/01/14 Returns post surgery with [name]. Has follow up visit later this week with him, currently in facility and NWB over this extremity. States no drainage from foot and no current wound care. On exam callus and scab in place, suspect healed. Has external fixator in place. Will defer to [name] for management, ok to place moisturizing cream over scabs and callus and she will call if she requires follow up here. READMISSION 01/28/2019 Patient was in the clinic in 2016 for a wound on the plantar aspect of her left calcaneus. Predominantly cared for by [name] she was referred to [name] and had corrective surgery for the position of her left ankle I believe. She had previously been here in 2015 and then prior to that in 2011 2012 that I do not have these records. More recently she has developed fairly extensive wounds on the right medial foot, left lateral foot and a small area on the right medial great toe. Right medial foot wound has been there for 6 to 7 months, left  foot wound for 2 to 3 months and the right great toe only over the last few weeks. She has been followed by [name] at Dr. Laverta [place] office it sounds as though she has been undergoing callus paring and silver nitrate. The patient has been washing these off with soap and water and plan applying dry dressing Past medical history, type 2 diabetes well controlled with a recent hemoglobin A1c of 7 on 11/16, type 2 diabetes with peripheral neuropathy, previous left  Charcot joint surgery bilateral Charcot joints currently, stage III chronic renal failure, hypertension, obstructive sleep apnea, history of MRSA and gastroesophageal reflux. 1/18; this is a patient with bilateral Charcot feet. Plain x-rays that I did of both of these wounds that are either at bone or precariously close to bone were done. On the right foot this showed possible lytic destruction involving the anterior aspect of the talus which may represent a wound osteomyelitis. An MRI was recommended. On the left there was no definite lytic destruction although the wound is deep undermining's and I think probably requires an MRI on this basis in any case 1/25; patient was supposed to have her MRI last Friday of her bilateral feet however they would not do it because there was silver alginate in her dressings, will replace with calcium alginate today and will see if we can get her done today 2/1; patient did not get her MRIs done last week because of issues with the MRI department receiving orders to do bilateral feet. She has 2 deep wounds in the setting of Charcot feet deformities bilaterally. Both of them at one point had exposed bone although I had trouble demonstrating that today. I am not sure that we can offload these very aggressively as I watched her leave the clinic last week her gait is very narrow and unsteady. 2/15; the MRI of her right foot did not show osteomyelitis potentially osteonecrosis of the talus. However on the left  foot there was suggestive findings of osteomyelitis in the calcaneus. The wounds are a large wound on the right medial foot and on the left a substantial wound on the left lateral plantar calcaneus. We have been using silver alginate. Ultimately I think we need to consider a total contact cast on the right while we work through the possibility of osteomyelitis in the left. I watched her walk out with her crutches I have some trepidation about the ability to walk with the cast on her right foot. Nevertheless with her Charcot deformities I do not think there is a way to heal these short of offloading them aggressively. 2/22; the culture of the bone in the left foot that I did last week showed a few Citrobacter Koseri as well as some streptococcal species. In my attempted bone for pathology did not actually yield a lot of bone the pathologist did not identify any osteomyelitis. 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 Is promised today and aggressive debridement of the right foot and a total contact cast which surprisingly she seemed to tolerate quite well 03/13/2019 upon evaluation today patient appears to be doing well with her total contact cast she is here for the first cast change. She had no areas of rubbing or any other complications at this point. Overall things are doing quite well. 2/26; the patient was kindly seen by Dr. Orvan Falconer of infectious disease on 2/25. She is now on IV vancomycin PICC line placement in 2 days and oral Flagyl. This was in response to her MRI showing osteomyelitis of the left calcaneus concerning for osteomyelitis. We have been putting her right leg in a total contact cast. Our nurses report drainage. She is tolerating the total contact cast well. 3/4; the patient is started her IV antibiotics and is taking IV vancomycin and  oral Flagyl. She appears to be tolerating these both  well. This is for osteomyelitis involving the left calcaneus. The area on the right in the setting of bilateral Charcot deformities did not have any bone infection on MRI. We put a total contact cast on her with silver alginate as the primary dressing and she returns today in follow-up. Per our intake nurse there was too much drainage to leave this on all week therefore we will bring her back for an early change 3/8; follow-up total contact cast she is still having a lot of drainage probably too much drainage from the right foot wound will week with the same cast. She will be back on Thursday. The area on the left looks somewhat better 3/11; patient here for a total contact cast change. 3/15; patient came in for wound care evaluation. She is still on IV vancomycin and oral Flagyl for osteomyelitis in the left foot. The area on the right foot we are putting in a total contact cast. 3/18 for total contact cast change on the right foot 3/22; the patient was here for wound review today. The area on the right foot is slightly smaller in terms of surface area. However the surface of the wound is very gritty and hyper granulated. More problematically the area on the plantar left foot has increased depth indeed in the center of this it probes to bone. We have been using Hydrofera Blue in both areas. She is on antibiotics as prescribed by infectious disease IV vancomycin and oral Flagyl 3/25; the patient is in for total contact cast change. Our intake nurse was concerned about the amount of drainage which soaked right to the multiple layers we are putting on this. Wondered about options. The wound bed actually looks as healthy as this is looked although there may not be a lot of change in dimensions things are looking a lot better. If we are going to heal this woman's wound which is in the middle of her right Charcot foot this is going to need to be  offloaded. There are not many alternatives 3/29; still not a lot of improvement although the surface of the right wound looks better. We have been using Hydrofera Blue 04/17/2019 upon evaluation today patient appears to be doing well with a total contact cast. With actually having to change this twice a week. She saw [name] for wound care earlier in the week this is for the cast change today. That is due to the fact that she has significant drainage. Overall the wound is been looking excellent however. 4/5; we continue to put a total contact cast on this patient with Hydrofera Blue. Still a lot of drainage which precludes a simple weekly change or changing this twice a week. 4/8; total contact cast changed today. Apparently the wound looks better per our intake nurse 4/12; patient here for wound care evaluation. Wounds on the right foot have not changed in dimension none about a month left foot looks similar. Apparently her antibiotics that we are giving her for osteomyelitis in the left foot complete on Wednesday. We have been using Hydrofera Blue on the right foot under a total contact cast. Nurses report greenish drainage although I think this may be discoloration from the [facility] 4/15; back for cast change wounds look quite good although still moderate amount of drainage. T contact cast reapplied otal 4/19;. Wound evaluation. Wound on the right foot is smaller surface looks healthy. There is still the divot in the middle part of this wound but I could not  feel any bone. Area on the left foot also looks better She has completed her antibiotics but still has the PICC line in. 4/23; patient comes back for a total contact cast change this was done in the standard fashion no issues 4/26; wound measures slightly larger on the right foot which is disappointing. She sees Dr. Orvan Falconer with regards to her antibiotics on Wednesday. I believe she is on vancomycin IV and oral Flagyl. 4/29; in  for her routine total contact cast change. She saw Dr. Orvan Falconer this morning. He is asking about antibiotic continuation I will be in touch with him. I did not look at her wound today 5/3; patient saw Dr. Orvan Falconer on 4/28. I did send him a note asking about the continuation of perhaps an oral regimen. I have not heard back. She still has an elevated sedimentation rate at 81. Her wounds are smaller. 5/6; in for her total contact cast change. We did not look at the wound today. She has seen Dr. Orvan Falconer and is now on oral Keflex and Flagyl. She seems to be tolerating these well 5/10; she had her PICC line removed. The wound on the right foot after some initial improvement has not made any improvement even with the total contact cast. Still having a lot of drainage. Likewise the area on the left foot really is not any better either 5/14; in for a cast change today. We will look at the right foot on Monday. If this is not making any progress I am going to try to cast the left foot. 5/17; the wound on the right foot is absolutely no different. I am going to put ointment on this area and change the cast of the left foot. We have been using silver alginate there 5/21; apparently the area on the right foot was better per our intake nurse although I did not see it. We did a standard total contact cast change on the left foot we have been applying silver alginate to the wound here. 06/10/19-Area on the right foot per dimensions slightly better, the left foot has still some depth applying silver alginate to the wound with TCC Objective Constitutional alert and oriented x 3. sitting or standing blood pressure is within target range for patient.. supine blood pressure is within target range for patient.. pulse regular and within target range for patient.Marland Kitchen respirations regular, non-labored and within target range for patient.Marland Kitchen temperature within target range for patient.. Well- nourished and well-hydrated in no  acute distress. Vitals Time Taken: 1:44 PM, Height: 68 in, Weight: 265 lbs, BMI: 40.3, Temperature: 98.2 F, Pulse: 83 bpm, Respiratory Rate: 18 breaths/min, Blood Pressure: 124/57 mmHg, Capillary Blood Glucose: 137 mg/dl. General Notes: Left plantar wound has some depth, portion of devitalized tissue was removed using curette not much bleeding observed Thin callus rim noted Right foot plantar wound also with a thin callus rim base is almost flush with surrounding skin Integumentary (Hair, Skin) Wound #10 status is Open. Original cause of wound was Blister. The wound is located on the Left,Lateral,Plantar Foot. The wound measures 1.2cm length x 1.6cm width x 1.1cm depth; 1.508cm^2 area and 1.659cm^3 volume. Wound #9 status is Open. Original cause of wound was Blister. The wound is located on the Right,Medial Foot. The wound measures 3cm length x 2.6cm width x 0.2cm depth; 6.126cm^2 area and 1.225cm^3 volume. 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 There was a Excisional Skin/Subcutaneous Tissue Debridement with a total area of 1.92 sq cm performed by Cassandria Anger, MD. With the following instrument(s): Curette to remove Viable and Non-Viable tissue/material. Material removed includes Subcutaneous Tissue and Slough and. No specimens were taken.  A time out was conducted at 14:32, prior to the start of the procedure. A Minimum amount of bleeding was controlled with Pressure. The procedure was tolerated well with a pain level of 0 throughout and a pain level of 0 following the procedure. Post Debridement Measurements: 1.2cm length x 1.6cm width x 1.1cm depth; 1.659cm^3 volume. Character of Wound/Ulcer Post Debridement is improved. Severity of Tissue Post Debridement is: Fat layer exposed. Post procedure Diagnosis Wound #10: Same as Pre-Procedure Pre-procedure diagnosis of Wound #10 is a Diabetic Wound/Ulcer of the Lower Extremity located on the Left,Lateral,Plantar Foot . There was a T Contact otal Cast Procedure by Cassandria Anger, MD. Post procedure Diagnosis Wound #10: Same as Pre-Procedure Plan Follow-up Appointments: Return Appointment in 1 week. Dressing Change Frequency: Wound #10 Left,Lateral,Plantar Foot: Do not change entire dressing for one week. Wound #9 Right,Medial Foot: Change Dressing every other day. Wound Cleansing: May shower with protection. Primary Wound Dressing: Wound #10 Left,Lateral,Plantar Foot: Calcium Alginate with Silver Wound #9 Right,Medial Foot: Polymem Silver Secondary Dressing: Wound #10 Left,Lateral,Plantar Foot: Foam Kerlix/Rolled Gauze Dry Gauze Drawtex Wound #9 Right,Medial Foot: Kerlix/Rolled Gauze ABD pad Zetuvit or Kerramax Off-Loading: T Contact Cast to Left Lower Extremity otal Open toe surgical shoe to: - foot 1. Continue current dressing including silver alginate to the left foot with TCC 2. Continue PolyMem to the right foot 3. Return to clinic next week Electronic Signature(s) Signed: 06/10/2019 2:36:41 PM By: Cassandria Anger MD, MBA Entered By: Cassandria Anger on 06/10/2019 14:36:41 -------------------------------------------------------------------------------- Total Contact Cast Details Patient Name: Date of Service: [name], [name] 06/10/2019 1:30 PM Medical  Record Number: [id number] Patient Account Number: [id number] Date of Birth/Sex: Treating RN: [birth year] (64 y.o. [name] Primary Care Provider: MA SO UDI, ELHA MA LSA DA T Other Clinician: Referring Provider: Treating Provider/Extender: Cassandria Anger MA SO UDI, ELHA MA LSA DA T Weeks in Treatment: 84 T Contact Cast Applied for Wound Assessment: otal Wound #10 Left,Lateral,Plantar Foot Performed By: Physician Cassandria Anger, MD Post Procedure Diagnosis Same as Pre-procedure Electronic Signature(s) Signed: 06/10/2019 5:12:22 PM By: Zandra Abts RN, BSN Signed: 06/13/2019 4:57:23 PM By: Cassandria Anger MD, MBA Entered By: Zandra Abts on 06/10/2019 14:35:10 -------------------------------------------------------------------------------- SuperBill Details Patient Name: Date of Service: [name], [name] 06/10/2019 Medical Record Number: [id number] Patient Account Number: [id number] Date of Birth/Sex: Treating RN: 07-21-1955 (64 y.o. [name] Primary Care Provider: MA SO UDI, [place] MA LSA DA T Other Clinician: Referring Provider: Treating Provider/Extender: Cassandria Anger MA SO UDI, ELHA MA LSA DA T Weeks in Treatment: 19 Diagnosis Coding ICD-10 Codes Code Description E11.621 Type 2 diabetes mellitus with foot ulcer L97.524 Non-pressure chronic ulcer of other part of left foot with necrosis of bone L97.511 Non-pressure chronic ulcer of other part  of right foot limited to breakdown of skin L97.514 Non-pressure chronic ulcer of other part of right foot with necrosis of bone E11.42 Type 2 diabetes mellitus with diabetic polyneuropathy M14.671 Charcot's joint, right ankle and foot M14.672 Charcot's joint, left ankle and foot 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, left ankle and foot Facility Procedures CPT4 Code: [id number] Description: 11042 - DEB SUBQ TISSUE 20 SQ CM/< ICD-10 Diagnosis Description E11.621 Type 2 diabetes mellitus with foot  ulcer Modifier: Quantity: 1 Physician Procedures : CPT4 Code Description Modifier [id number] 11042 - WC PHYS SUBQ TISS 20 SQ CM ICD-10 Diagnosis Description E11.621 Type 2 diabetes mellitus with foot ulcer Quantity: 1 Electronic Signature(s) Signed: 06/10/2019 2:36:58 PM By: Cassandria Anger MD, MBA Entered By: Cassandria Anger on 06/10/2019 14:36:56

## 2019-06-13 NOTE — Progress Notes (Signed)
PATRICIE, GEESLIN ([id number]) Visit Report for 06/13/2019 HPI Details Patient Name: Date of Service: [name], FIFITA 06/13/2019 1:30 PM Medical Record Number: [id number] Patient Account Number: [id number] Date of Birth/Sex: Treating RN: [birth year] (64 y.o. [name] Primary Care Provider: MA SO UDI, ELHA MA LSA DA T Other Clinician: Referring Provider: Treating Provider/Extender: Tobi Bastos MA SO UDI, ELHA MA LSA DA T Weeks in Treatment: 68 History of Present Illness HPI Description: 64 yrs old bf here for follow up recurrent left charcot foot ulcer. She has DM. She is not a smoker. She states a blister developed 3 months ago at site of previous breakdown from 1 year ago. It was debrided at the podiatrist's office. She went to the ER and then sent here. She denies pain. Xray was negative for osteo. Culture from this clinic negative. 09/08/[name] to [name] for Charcot foot. Seen by him and MRI scheduled 09/19/14. Prior silver collagen but this was not ordered last visit, patient has been rinsing with saline alone. Re institute silver collagen every other day. F/u 2 weeks with Dr. Dellia Nims as Labor Day holiday. Supplies ordered. 09/25/14; this is a patient with a Charcot foot on the left. she has a wound over the plantar aspect of her left calcaneus. She has been seen by [name] of orthopedic surgery and has had an MRI. She is apparently going within the next week or 2 for corrective surgery which will involve an external fixator. I don't have any of the details of this. 10/06/14; The patient is a 64 yrs old bf with a left Charcot foot and ulcer on the plantar. 12/01/14 Returns post surgery with [name]. Has follow up visit later this week with him, currently in facility and [facility] over this extremity. States no drainage from foot and no current wound care. On exam callus and scab in place, suspect healed. Has external fixator in place. Will defer to [name]  for management, ok to place moisturizing cream over scabs and callus and she will call if she requires follow up here. READMISSION 01/28/2019 Patient was in the clinic in 2016 for a wound on the plantar aspect of her left calcaneus. Predominantly cared for by Dr. [name] she was referred to [name] and had corrective surgery for the position of her left ankle I believe. She had previously been here in 2015 and then prior to that in 2011 2012 that I do not have these records. More recently she has developed fairly extensive wounds on the right medial foot, left lateral foot and a small area on the right medial great toe. Right medial foot wound has been there for 6 to 7 months, left foot wound for 2 to 3 months and the right great toe only over the last few weeks. She has been followed by [name] at Dr. [name] office it sounds as though she has been undergoing callus paring and silver nitrate. The patient has been washing these off with soap and water and plan applying dry dressing Past medical history, type 2 diabetes well controlled with a recent hemoglobin A1c of 7 on 11/16, type 2 diabetes with peripheral neuropathy, previous left Charcot joint surgery bilateral Charcot joints currently, stage III chronic renal failure, hypertension, obstructive sleep apnea, history of MRSA and gastroesophageal reflux. 1/18; this is a patient with bilateral Charcot feet. Plain x-rays that I did of both of these wounds that are either at bone or precariously close to bone  were done. On the right foot this showed possible lytic destruction involving the anterior aspect of the talus which may represent a wound osteomyelitis. An MRI was recommended. On the left there was no definite lytic destruction although the wound is deep undermining's and I think probably requires an MRI on this basis in any case 1/25; patient was supposed to have her MRI last Friday of her bilateral feet however they would not do  it because there was silver alginate in her dressings, will replace with calcium alginate today and will see if we can get her done today 2/1; patient did not get her MRIs done last week because of issues with the MRI department receiving orders to do bilateral feet. She has 2 deep wounds in the setting of Charcot feet deformities bilaterally. Both of them at one point had exposed bone although I had trouble demonstrating that today. I am not sure that we can offload these very aggressively as I watched her leave the clinic last week her gait is very narrow and unsteady. 2/15; the MRI of her right foot did not show osteomyelitis potentially osteonecrosis of the talus. 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 The wounds are a large wound on the right medial foot and on the left a substantial wound on the left lateral plantar calcaneus. We have been using silver alginate. Ultimately I think we need to consider a total contact cast on the right while we work through the possibility of osteomyelitis in the left. I watched her walk out with her crutches I have some trepidation about the ability to walk with the cast on her right foot. Nevertheless with her Charcot deformities I do not think there is a way to heal these short of offloading them aggressively. 2/22; the culture of the bone in the left foot that I did last week showed a few Citrobacter Koseri as well as some streptococcal species. In my attempted bone for pathology did not actually yield a lot of bone the pathologist did not identify any osteomyelitis. 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 Is promised today and aggressive debridement of the right foot and a total contact cast which surprisingly she seemed to tolerate quite well 03/13/2019 upon  evaluation today patient appears to be doing well with her total contact cast she is here for the first cast change. She had no areas of rubbing or any other complications at this point. Overall things are doing quite well. 2/26; the patient was kindly seen by Dr. Megan Salon of infectious disease on 2/25. She is now on IV vancomycin PICC line placement in 2 days and oral Flagyl. This was in response to her MRI showing osteomyelitis of the left calcaneus concerning for osteomyelitis. We have been putting her right leg in a total contact cast. Our nurses report drainage. She is tolerating the total contact cast well. 3/4; the patient is started her IV antibiotics and is taking IV vancomycin and oral Flagyl. She appears to be tolerating these both well. This is for osteomyelitis involving the left calcaneus. The area on the right in the setting of bilateral Charcot deformities did not have any bone infection on MRI. We put a total contact cast on her with silver alginate as the primary dressing and she returns today in follow-up. Per  our intake nurse there was too much drainage to leave this on all week therefore we will bring her back for an early change 3/8; follow-up total contact cast she is still having a lot of drainage probably too much drainage from the right foot wound will week with the same cast. She will be back on Thursday. The area on the left looks somewhat better 3/11; patient here for a total contact cast change. 3/15; patient came in for wound care evaluation. She is still on IV vancomycin and oral Flagyl for osteomyelitis in the left foot. The area on the right foot we are putting in a total contact cast. 3/18 for total contact cast change on the right foot 3/22; the patient was here for wound review today. The area on the right foot is slightly smaller in terms of surface area. However the surface of the wound is very gritty and hyper granulated. More problematically the area on the  plantar left foot has increased depth indeed in the center of this it probes to bone. We have been using Hydrofera Blue in both areas. She is on antibiotics as prescribed by infectious disease IV vancomycin and oral Flagyl 3/25; the patient is in for total contact cast change. Our intake nurse was concerned about the amount of drainage which soaked right to the multiple layers we are putting on this. Wondered about options. The wound bed actually looks as healthy as this is looked although there may not be a lot of change in dimensions things are looking a lot better. If we are going to heal this woman's wound which is in the middle of her right Charcot foot this is going to need to be offloaded. There are not many alternatives 3/29; still not a lot of improvement although the surface of the right wound looks better. We have been using Hydrofera Blue 04/17/2019 upon evaluation today patient appears to be doing well with a total contact cast. With actually having to change this twice a week. She saw Dr. Dellia Nims for wound care earlier in the week this is for the cast change today. That is due to the fact that she has significant drainage. Overall the wound is been looking excellent however. 4/5; we continue to put a total contact cast on this patient with Hydrofera Blue. Still a lot of drainage which precludes a simple weekly change or changing this twice a week. 4/8; total contact cast changed today. Apparently the wound looks better per our intake nurse 4/12; patient here for wound care evaluation. Wounds on the right foot have not changed in dimension none about a month left foot looks similar. Apparently her antibiotics that we are giving her for osteomyelitis in the left foot complete on Wednesday. We have been using Hydrofera Blue on the right foot under a total contact cast. Nurses report greenish drainage although I think this may be discoloration from the [facility] 4/15; back for cast  change wounds look quite good although still moderate amount of drainage. T contact cast reapplied otal 4/19;. Wound evaluation. Wound on the right foot is smaller surface looks healthy. There is still the divot in the middle part of this wound but I could not feel any bone. Area on the left foot also looks better She has completed her antibiotics but still has the PICC line in. 4/23; patient comes back for a total contact cast change this was done in the standard fashion no issues 4/26; wound measures slightly larger on the right  foot which is disappointing. She sees Dr. Orvan Falconer with regards to her antibiotics on Wednesday. I believe she is on vancomycin IV and oral Flagyl. 4/29; in for her routine total contact cast change. She saw Dr. Orvan Falconer this morning. He is asking about antibiotic continuation I will be in touch with him. I did not look at her wound today 5/3; patient saw Dr. Orvan Falconer on 4/28. I did send him a note asking about the continuation of perhaps an oral regimen. I have not heard back. She still has an elevated sedimentation rate at 81. Her wounds are smaller. 5/6; in for her total contact cast change. We did not look at the wound today. She has seen Dr. Orvan Falconer and is now on oral Keflex and Flagyl. She seems to be tolerating these well 5/10; she had her PICC line removed. The wound on the right foot after some initial improvement has not made any improvement even with the total contact cast. Still having a lot of drainage. Likewise the area on the left foot really is not any better either 5/14; in for a cast change today. We will look at the right foot on Monday. If this is not making any progress I am going to try to cast the left foot. 5/17; the wound on the right foot is absolutely no different. I am going to put ointment on this area and change the cast of the left foot. We have been using silver alginate there 5/21; apparently the area on the right foot was better per our  intake nurse although I did not see it. We did a standard total contact cast change on the left foot we have been applying silver alginate to the wound here. 06/10/19-Area on the right foot per dimensions slightly better, the left foot has still some depth applying silver alginate to the wound with TCC 06/13/19-Patient here for Casting to Left foot Electronic Signature(s) Signed: 06/13/2019 2:38:01 PM By: Cassandria Anger MD, MBA Entered By: Cassandria Anger on 06/13/2019 14:38:01 -------------------------------------------------------------------------------- Physician Orders Details Patient Name: Date of Service: [name], [name] 06/13/2019 1:30 PM Medical Record Number: [id number] Patient Account Number: [id number] Date of Birth/Sex: Treating RN: 28-Feb-1955 (64 y.o. [name] Primary Care Provider: MA SO UDI, ELHA MA LSA DA T Other Clinician: Referring Provider: Treating Provider/Extender: Cassandria Anger MA SO UDI, ELHA MA LSA DA T Weeks in Treatment: 79 Verbal / Phone Orders: No Diagnosis Coding Follow-up Appointments Return Appointment in 1 week. ppointment in: - Thursday for cast change Return A Dressing Change Frequency Wound #10 Left,Lateral,Plantar Foot Do not change entire dressing for one week. Wound #9 Right,Medial Foot Change Dressing every other day. Wound Cleansing May shower with protection. Primary Wound Dressing Wound #10 Left,Lateral,Plantar Foot Calcium Alginate with Silver Wound #9 Right,Medial Foot Polymem Silver Secondary Dressing Wound #10 Left,Lateral,Plantar Foot Foam Kerlix/Rolled Gauze Dry Gauze Drawtex Wound #9 Right,Medial Foot Kerlix/Rolled Gauze ABD pad Zetuvit or Kerramax Off-Loading Total Contact Cast to Left Lower Extremity Open toe surgical shoe to: - foot Electronic Signature(s) Signed: 06/13/2019 4:57:23 PM By: Cassandria Anger MD, MBA Signed: 06/13/2019 5:42:52 PM By: Shawn Stall Entered By: Shawn Stall on 06/13/2019  14:11:53 -------------------------------------------------------------------------------- Problem List Details Patient Name: Date of Service: [name], Trinka J. 06/13/2019 1:30 PM Medical Record Number: [id number] Patient Account Number: [id number] Date of Birth/Sex: Treating RN: [birth year] (64 y.o. [name] Primary Care Provider: MA SO UDI, Judene Companion MA LSA DA T Other Clinician: Referring Provider: Treating Provider/Extender: Cassandria Anger MA SO  UDI, ELHA MA LSA DA T Weeks in Treatment: 19 Active Problems ICD-10 Encounter Code Description Active Date MDM Diagnosis E11.621 Type 2 diabetes mellitus with foot ulcer 01/28/2019 No Yes L97.524 Non-pressure chronic ulcer of other part of left foot with necrosis of bone 01/28/2019 No Yes L97.511 Non-pressure chronic ulcer of other part of right foot limited to breakdown of 01/28/2019 No Yes skin L97.514 Non-pressure chronic ulcer of other part of right foot with necrosis of bone 01/28/2019 No Yes E11.42 Type 2 diabetes mellitus with diabetic polyneuropathy 01/28/2019 No Yes M14.671 Charcot's joint, right ankle and foot 01/28/2019 No Yes M14.672 Charcot's joint, left ankle and foot 01/28/2019 No Yes 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 06/13/2019 1:30 PM Medical Record Number: [id number] Patient Account Number: [id number] Date of Birth/Sex: Treating RN: 07-Feb-1955 (64 y.o. [name] Primary Care Provider: MA SO UDI, ELHA MA LSA DA T Other Clinician: Referring Provider: Treating Provider/Extender: Cassandria Anger MA SO UDI, ELHA MA  LSA DA T Weeks in Treatment: 19 Subjective History of Present Illness (HPI) 64 yrs old bf here for follow up recurrent left charcot foot ulcer. She has DM. She is not a smoker. She states a blister developed 3 months ago at site of previous breakdown from 1 year ago. It was debrided at the podiatrist's office. She went to the ER and then sent here. She denies pain. Xray was negative for osteo. Culture from this clinic negative. 09/08/14 [name] to [name] for Charcot foot. Seen by him and MRI scheduled 09/19/14. Prior silver collagen but this was not ordered last visit, patient has been rinsing with saline alone. Re institute silver collagen every other day. F/u 2 weeks with [name] as Labor Day holiday. Supplies ordered. 09/25/14; this is a patient with a Charcot foot on the left. she has a wound over the plantar aspect of her left calcaneus. She has been seen by [name] of orthopedic surgery and has had an MRI. She is apparently going within the next week or 2 for corrective surgery which will involve an external fixator. I don't have any of the details of this. 10/06/14; The patient is a 64 yrs old bf with a left Charcot foot and ulcer on the plantar. 12/01/14 Returns post surgery with [name]. Has follow up visit later this week with him, currently in facility and NWB over this extremity. States no drainage from foot and no current wound care. On exam callus and scab in place, suspect healed. Has external fixator in place. Will defer to [name] for management, ok to place moisturizing cream over scabs and callus and she will call if she requires follow up here. READMISSION 01/28/2019 Patient was in the clinic in 2016 for a wound on the plantar aspect of her left calcaneus. Predominantly cared for by [name] she was referred to [name] and had corrective surgery for the position of her left ankle I believe. She had previously been here in 2015 and then prior  to that in 2011 2012 that I do not have these records. More recently she has developed fairly extensive wounds on the right medial foot, left lateral foot and a small area on the right medial  great toe. Right medial foot wound has been there for 6 to 7 months, left foot wound for 2 to 3 months and the right great toe only over the last few weeks. She has been followed by [name] at Dr. Laverta [place] office it sounds as though she has been undergoing callus paring and silver nitrate. The patient has been washing these off with soap and water and plan applying dry dressing Past medical history, type 2 diabetes well controlled with a recent hemoglobin A1c of 7 on 11/16, type 2 diabetes with peripheral neuropathy, previous left Charcot joint surgery bilateral Charcot joints currently, stage III chronic renal failure, hypertension, obstructive sleep apnea, history of MRSA and gastroesophageal reflux. 1/18; this is a patient with bilateral Charcot feet. Plain x-rays that I did of both of these wounds that are either at bone or precariously close to bone were done. On the right foot this showed possible lytic destruction involving the anterior aspect of the talus which may represent a wound osteomyelitis. An MRI was recommended. On the left there was no definite lytic destruction although the wound is deep undermining's and I think probably requires an MRI on this basis in any case 1/25; patient was supposed to have her MRI last Friday of her bilateral feet however they would not do it because there was silver alginate in her dressings, will replace with calcium alginate today and will see if we can get her done today 2/1; patient did not get her MRIs done last week because of issues with the MRI department receiving orders to do bilateral feet. She has 2 deep wounds in the setting of Charcot feet deformities bilaterally. Both of them at one point had exposed bone although I had trouble demonstrating that  today. I am not sure that we can offload these very aggressively as I watched her leave the clinic last week her gait is very narrow and unsteady. 2/15; the MRI of her right foot did not show osteomyelitis potentially osteonecrosis of the talus. 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 The wounds are a large wound on the right medial foot and on the left a substantial wound on the left lateral plantar calcaneus. We have been using silver alginate. Ultimately I think we need to consider a total contact cast on the right while we work through the possibility of osteomyelitis in the left. I watched her walk out with her crutches I have some trepidation about the ability to walk with the cast on her right foot. Nevertheless with her Charcot deformities I do not think there is a way to heal these short of offloading them aggressively. 2/22; the culture of the bone in the left foot that I did last week showed a few Citrobacter Koseri as well as some streptococcal species. In my attempted bone for pathology did not actually yield a lot of bone the pathologist did not identify any osteomyelitis. 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 Is promised today and aggressive debridement of the right foot and a total contact cast which surprisingly she seemed to tolerate quite well 03/13/2019 upon evaluation today patient appears to be doing well with her total contact cast she is here for the first cast change. She had no areas of rubbing or any other complications at this point. Overall  things are doing quite well. 2/26; the patient was kindly seen by Dr. Orvan Falconer of infectious disease on 2/25. She is now on IV vancomycin PICC line placement in 2 days and oral Flagyl. This was in response to her MRI showing osteomyelitis  of the left calcaneus concerning for osteomyelitis. We have been putting her right leg in a total contact cast. Our nurses report drainage. She is tolerating the total contact cast well. 3/4; the patient is started her IV antibiotics and is taking IV vancomycin and oral Flagyl. She appears to be tolerating these both well. This is for osteomyelitis involving the left calcaneus. The area on the right in the setting of bilateral Charcot deformities did not have any bone infection on MRI. We put a total contact cast on her with silver alginate as the primary dressing and she returns today in follow-up. Per our intake nurse there was too much drainage to leave this on all week therefore we will bring her back for an early change 3/8; follow-up total contact cast she is still having a lot of drainage probably too much drainage from the right foot wound will week with the same cast. She will be back on Thursday. The area on the left looks somewhat better 3/11; patient here for a total contact cast change. 3/15; patient came in for wound care evaluation. She is still on IV vancomycin and oral Flagyl for osteomyelitis in the left foot. The area on the right foot we are putting in a total contact cast. 3/18 for total contact cast change on the right foot 3/22; the patient was here for wound review today. The area on the right foot is slightly smaller in terms of surface area. However the surface of the wound is very gritty and hyper granulated. More problematically the area on the plantar left foot has increased depth indeed in the center of this it probes to bone. We have been using Hydrofera Blue in both areas. She is on antibiotics as prescribed by infectious disease IV vancomycin and oral Flagyl 3/25; the patient is in for total contact cast change. Our intake nurse was concerned about the amount of drainage which soaked right to the multiple layers we are putting on this. Wondered about options. The  wound bed actually looks as healthy as this is looked although there may not be a lot of change in dimensions things are looking a lot better. If we are going to heal this woman's wound which is in the middle of her right Charcot foot this is going to need to be offloaded. There are not many alternatives 3/29; still not a lot of improvement although the surface of the right wound looks better. We have been using Hydrofera Blue 04/17/2019 upon evaluation today patient appears to be doing well with a total contact cast. With actually having to change this twice a week. She saw [name] for wound care earlier in the week this is for the cast change today. That is due to the fact that she has significant drainage. Overall the wound is been looking excellent however. 4/5; we continue to put a total contact cast on this patient with Hydrofera Blue. Still a lot of drainage which precludes a simple weekly change or changing this twice a week. 4/8; total contact cast changed today. Apparently the wound looks better per our intake nurse 4/12; patient here for wound care evaluation. Wounds on the right foot have not changed in dimension none about a month left foot  looks similar. Apparently her antibiotics that we are giving her for osteomyelitis in the left foot complete on Wednesday. We have been using Hydrofera Blue on the right foot under a total contact cast. Nurses report greenish drainage although I think this may be discoloration from the [facility] 4/15; back for cast change wounds look quite good although still moderate amount of drainage. T contact cast reapplied otal 4/19;. Wound evaluation. Wound on the right foot is smaller surface looks healthy. There is still the divot in the middle part of this wound but I could not feel any bone. Area on the left foot also looks better She has completed her antibiotics but still has the PICC line in. 4/23; patient comes back for a total contact cast  change this was done in the standard fashion no issues 4/26; wound measures slightly larger on the right foot which is disappointing. She sees Dr. Orvan Falconer with regards to her antibiotics on Wednesday. I believe she is on vancomycin IV and oral Flagyl. 4/29; in for her routine total contact cast change. She saw Dr. Orvan Falconer this morning. He is asking about antibiotic continuation I will be in touch with him. I did not look at her wound today 5/3; patient saw Dr. Orvan Falconer on 4/28. I did send him a note asking about the continuation of perhaps an oral regimen. I have not heard back. She still has an elevated sedimentation rate at 81. Her wounds are smaller. 5/6; in for her total contact cast change. We did not look at the wound today. She has seen Dr. Orvan Falconer and is now on oral Keflex and Flagyl. She seems to be tolerating these well 5/10; she had her PICC line removed. The wound on the right foot after some initial improvement has not made any improvement even with the total contact cast. Still having a lot of drainage. Likewise the area on the left foot really is not any better either 5/14; in for a cast change today. We will look at the right foot on Monday. If this is not making any progress I am going to try to cast the left foot. 5/17; the wound on the right foot is absolutely no different. I am going to put ointment on this area and change the cast of the left foot. We have been using silver alginate there 5/21; apparently the area on the right foot was better per our intake nurse although I did not see it. We did a standard total contact cast change on the left foot we have been applying silver alginate to the wound here. 06/10/19-Area on the right foot per dimensions slightly better, the left foot has still some depth applying silver alginate to the wound with TCC 06/13/19-Patient here for Casting to Left foot Objective Constitutional Vitals Time Taken: 2:08 PM, Height: 68 in, Weight: 265  lbs, BMI: 40.3, Temperature: 98.2 F, Pulse: 80 bpm, Respiratory Rate: 18 breaths/min, Blood Pressure: 144/74 mmHg, Capillary Blood Glucose: 134 mg/dl. Integumentary (Hair, Skin) Wound #10 status is Open. Original cause of wound was Blister. The wound is located on the Left,Lateral,Plantar Foot. The wound measures 1.2cm length x 1.6cm width x 1.1cm depth; 1.508cm^2 area and 1.659cm^3 volume. Wound #9 status is Open. Original cause of wound was Blister. The wound is located on the Right,Medial Foot. The wound measures 3cm length x 2.6cm width x 0.2cm depth; 6.126cm^2 area and 1.225cm^3 volume. 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 There was a T Contact otal Cast Procedure by Cassandria Anger, MD. Post procedure Diagnosis Wound #10: Same as Pre-Procedure Plan Follow-up Appointments: Return Appointment in 1 week. Return Appointment in: - Thursday for cast change Dressing Change Frequency: Wound #10 Left,Lateral,Plantar Foot: Do not change entire dressing for one week. Wound #9 Right,Medial Foot: Change Dressing every other day. Wound Cleansing: May shower with protection. Primary Wound Dressing: Wound #10 Left,Lateral,Plantar Foot: Calcium Alginate with Silver Wound #9 Right,Medial Foot: Polymem Silver Secondary Dressing: Wound #10 Left,Lateral,Plantar Foot: Foam Kerlix/Rolled  Gauze Dry Gauze Drawtex Wound #9 Right,Medial Foot: Kerlix/Rolled Gauze ABD pad Zetuvit or Kerramax Off-Loading: T Contact Cast to Left Lower Extremity otal Open toe surgical shoe to: - foot Cast reapplied to Left LE Electronic Signature(s) Signed: 06/13/2019 2:38:19 PM By: Cassandria Anger MD, MBA Entered By: Cassandria Anger on 06/13/2019 14:38:18 -------------------------------------------------------------------------------- Total Contact Cast Details Patient Name: Date of Service: [name], Georgena J. 06/13/2019 1:30 PM Medical Record Number: [id number] Patient Account Number: [id number] Date of Birth/Sex: Treating RN: Apr 22, 1955 (64 y.o. [name] Primary Care Provider: MA SO UDI, ELHA MA LSA DA T Other Clinician: Referring Provider: Treating Provider/Extender: Cassandria Anger MA SO UDI, ELHA MA LSA DA T Weeks in Treatment: 50 T Contact Cast Applied for Wound Assessment: otal Wound #10 Left,Lateral,Plantar Foot Performed By: Physician Cassandria Anger, MD Post Procedure Diagnosis Same as Pre-procedure Electronic Signature(s) Signed: 06/13/2019 4:57:23 PM By: Cassandria Anger MD, MBA Signed: 06/13/2019 5:42:52 PM By: Shawn Stall Entered By: Shawn Stall on 06/13/2019 14:12:58 -------------------------------------------------------------------------------- SuperBill Details Patient Name: Date of Service: [facility], Annaleah J. 06/13/2019 Medical Record Number: [id number] Patient Account Number: [id number] Date of Birth/Sex: Treating RN: [birth year] (64 y.o. [name] Primary Care Provider: MA SO UDI, Hayden MA LSA DA T Other Clinician: Referring Provider: Treating Provider/Extender: Cassandria Anger MA SO UDI, ELHA MA LSA DA T Weeks in Treatment: 19 Diagnosis Coding ICD-10 Codes Code Description E11.621 Type 2 diabetes mellitus with foot ulcer L97.524 Non-pressure chronic ulcer of other part of left foot with necrosis of bone L97.511 Non-pressure chronic  ulcer of other part of right foot limited to breakdown of skin L97.514 Non-pressure chronic ulcer of other part of right foot with necrosis of bone E11.42 Type 2 diabetes mellitus with diabetic polyneuropathy M14.671 Charcot's joint, right ankle and foot M14.672 Charcot's joint, left ankle and foot 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, left ankle and foot Facility Procedures CPT4 Code: [id number] Description: [phone] - APPLY TOTAL CONTACT LEG CAST ICD-10 Diagnosis Description L97.524 Non-pressure chronic ulcer of other part of left foot with necrosis of bone Modifier: Quantity: 1 Physician Procedures Electronic Signature(s) Signed: 06/13/2019 2:38:28 PM By: Cassandria Anger MD, MBA Entered By: Cassandria Anger on 06/13/2019 14:38:27

## 2019-06-18 ENCOUNTER — Encounter (HOSPITAL_BASED_OUTPATIENT_CLINIC_OR_DEPARTMENT_OTHER): Payer: Medicare Other | Attending: Internal Medicine | Admitting: Internal Medicine

## 2019-06-18 ENCOUNTER — Other Ambulatory Visit: Payer: Self-pay

## 2019-06-18 DIAGNOSIS — E11621 Type 2 diabetes mellitus with foot ulcer: Secondary | ICD-10-CM | POA: Diagnosis present

## 2019-06-18 DIAGNOSIS — E1169 Type 2 diabetes mellitus with other specified complication: Secondary | ICD-10-CM | POA: Insufficient documentation

## 2019-06-18 DIAGNOSIS — L97524 Non-pressure chronic ulcer of other part of left foot with necrosis of bone: Secondary | ICD-10-CM | POA: Diagnosis not present

## 2019-06-18 DIAGNOSIS — M86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, left ankle and foot: Secondary | ICD-10-CM | POA: Diagnosis not present

## 2019-06-18 DIAGNOSIS — L84 Corns and callosities: Secondary | ICD-10-CM | POA: Diagnosis not present

## 2019-06-18 DIAGNOSIS — E1142 Type 2 diabetes mellitus with diabetic polyneuropathy: Secondary | ICD-10-CM | POA: Insufficient documentation

## 2019-06-18 DIAGNOSIS — E11610 Type 2 diabetes mellitus with diabetic neuropathic arthropathy: Secondary | ICD-10-CM | POA: Insufficient documentation

## 2019-06-18 DIAGNOSIS — L97511 Non-pressure chronic ulcer of other part of right foot limited to breakdown of skin: Secondary | ICD-10-CM | POA: Insufficient documentation

## 2019-06-19 NOTE — Progress Notes (Signed)
Sarah, [name] ([id number]) Visit Report for 06/18/2019 Arrival Information Details Patient Name: Date of Service: Sarah, [name] 06/18/2019 3:30 PM Medical Record Number: [id number] Patient Account Number: [id number] Date of Birth/Sex: Treating RN: 12-Mar-1955 (64 y.o. [name] Primary Care Tyri Elmore: MA SO UDI, [place] MA LSA DA T Other Clinician: Referring Natividad Halls: Treating Daneille Desilva/Extender: Linton Ham MA SO UDI, ELHA MA LSA DA T Weeks in Treatment: 20 Visit Information History Since Last Visit Added or deleted any medications: No Patient Arrived: Crutches Any new allergies or adverse reactions: No Arrival Time: 16:00 Had a fall or experienced change in No Accompanied By: self activities of daily living that may affect Transfer Assistance: None risk of falls: Patient Identification Verified: Yes Signs or symptoms of abuse/neglect since No Secondary Verification Process Completed: Yes last visito Patient Requires Transmission-Based Precautions: No Hospitalized since last visit: No Patient Has Alerts: No Implantable device outside of the clinic No excluding cellular tissue based products placed in the [name] since last visit: Has Dressing in Place as Prescribed: Yes Has Footwear/Offloading in Place as Yes Prescribed: Left: T Contact Cast otal Right: Surgical Shoe with Pressure Relief Insole Pain Present Now: No Electronic Signature(s) Signed: 06/18/2019 5:56:12 PM By: Deon Pilling Entered By: Deon Pilling on 06/18/2019 16:09:50 -------------------------------------------------------------------------------- Compression Therapy Details Patient Name: Date of Service: [name], [name] 06/18/2019 3:30 PM Medical Record Number: [id number] Patient Account Number: [id number] Date of Birth/Sex: Treating RN: 08-03-1955 (65 y.o. Orvan Falconer Primary Care Gailen Venne: MA SO UDI, [place] MA LSA DA T Other Clinician: Referring Zekiah Caruth: Treating Tiffannie Sloss/Extender:  Linton Ham MA SO UDI, ELHA MA LSA DA T Weeks in Treatment: 20 Compression Therapy Performed for Wound Assessment: Wound #9 Right,Medial Foot Performed By: Clinician Carlene Coria, RN Compression Type: Three Layer Post Procedure Diagnosis Same as Pre-procedure Electronic Signature(s) Signed: 06/19/2019 4:36:43 PM By: Carlene Coria RN Entered By: Carlene Coria on 06/18/2019 17:08:56 -------------------------------------------------------------------------------- Encounter Discharge Information Details Patient Name: Date of Service: [name], [name] 06/18/2019 3:30 PM Medical Record Number: [id number] Patient Account Number: [id number] Date of Birth/Sex: Treating RN: [birth year] (64 y.o. Clearnce Sorrel Primary Care Cederick Broadnax: MA SO UDI, Willette Pa MA LSA DA T Other Clinician: Referring Cardale Dorer: Treating Osa Campoli/Extender: Linton Ham MA SO UDI, ELHA MA LSA DA T Weeks in Treatment: 20 Encounter Discharge Information Items Discharge Condition: Stable Ambulatory Status: Crutches Discharge Destination: Home Transportation: Private Auto Accompanied By: self Schedule Follow-up Appointment: Yes Clinical Summary of Care: Patient Declined Electronic Signature(s) Signed: 06/19/2019 7:13:26 AM By: Kela Millin Entered By: Kela Millin on 06/18/2019 17:38:14 -------------------------------------------------------------------------------- Lower Extremity Assessment Details Patient Name: Date of Service: [name], [name] 06/18/2019 3:30 PM Medical Record Number: [id number] Patient Account Number: [id number] Date of Birth/Sex: Treating RN: [birth year] (64 y.o. [name] Primary Care Shauniece Kwan: MA SO UDI, [place] MA LSA DA T Other Clinician: Referring Anjani Feuerborn: Treating Naiya Corral/Extender: Linton Ham MA SO UDI, ELHA MA LSA DA T Weeks in Treatment: 20 Edema Assessment Assessed: [Left: Yes] [Right: Yes] Edema: [Left: Yes] [Right: Yes] Calf Left: Right: Point of  Measurement: 43 cm From Medial Instep 36 cm 40 cm Ankle Left: Right: Point of Measurement: 12 cm From Medial Instep 24 cm 28.5 cm Vascular Assessment Pulses: Dorsalis Pedis Palpable: [Left:Yes] [Right:Yes] Electronic Signature(s) Signed: 06/18/2019 5:56:12 PM By: Deon Pilling Entered By: Deon Pilling on 06/18/2019 16:12:14 -------------------------------------------------------------------------------- Multi Wound Chart Details Patient Name: Date of Service: [name], [name] 06/18/2019 3:30 PM Medical Record Number: [id number] Patient Account Number: [id number]  Date of Birth/Sex: Treating RN: [birth year] (64 y.o. F) Epps, Carrie Primary Care Carlo Guevarra: MA SO UDI, [place] MA LSA DA T Other Clinician: Referring Iaan Oregel: Treating Samaiya Awadallah/Extender: Baltazar Najjar MA SO UDI, ELHA MA LSA DA T Weeks in Treatment: 20 Vital Signs Height(in): 68 Capillary Blood Glucose(mg/dl): 564 Weight(lbs): 332 Pulse(bpm): 57 Body Mass Index(BMI): 40 Blood Pressure(mmHg): 124/55 Temperature(F): 98.1 Respiratory Rate(breaths/min): 18 Photos: [10:No Photos Left, Lateral, Plantar Foot] [9:No Photos Right, Medial Foot] [N/A:N/A N/A] Wound Location: [10:Blister] [9:Blister] [N/A:N/A] Wounding Event: [10:Diabetic Wound/Ulcer of the Lower] [9:Diabetic Wound/Ulcer of the Lower] [N/A:N/A] Primary Etiology: [10:Extremity Glaucoma, Chronic sinus] [9:Extremity Glaucoma, Chronic sinus] [N/A:N/A] Comorbid History: [10:problems/congestion, Anemia, Sleep Apnea, Hypertension, Type II Diabetes, Rheumatoid Arthritis, Neuropathy 11/18/2018] [9:problems/congestion, Anemia, Sleep Apnea, Hypertension, Type II Diabetes, Rheumatoid Arthritis, Neuropathy  06/18/2018] [N/A:N/A] Date Acquired: [10:20] [9:20] [N/A:N/A] Weeks of Treatment: [10:Open] [9:Open] [N/A:N/A] Wound Status: [10:2.8x2.5x0.3] [9:2.8x2.5x0.3] [N/A:N/A] Measurements L x W x D (cm) [10:5.498] [9:5.498] [N/A:N/A] A (cm) : rea [10:1.649] [9:1.649]  [N/A:N/A] Volume (cm) : [10:-212.60%] [9:50.00%] [N/A:N/A] % Reduction in A rea: [10:37.50%] [9:83.30%] [N/A:N/A] % Reduction in Volume: [10:Grade 2] [9:Grade 2] [N/A:N/A] Classification: [10:Medium] [9:Medium] [N/A:N/A] Exudate A mount: [10:Purulent] [9:Serosanguineous] [N/A:N/A] Exudate Type: [10:yellow, brown, green] [9:red, brown] [N/A:N/A] Exudate Color: [10:Distinct, outline attached] [9:Distinct, outline attached] [N/A:N/A] Wound Margin: [10:Large (67-100%)] [9:Large (67-100%)] [N/A:N/A] Granulation A mount: [10:Red] [9:Red, Pink] [N/A:N/A] Granulation Quality: [10:Small (1-33%)] [9:None Present (0%)] [N/A:N/A] Necrotic A mount: [10:Fat Layer (Subcutaneous Tissue)] [9:Fat Layer (Subcutaneous Tissue)] [N/A:N/A] Exposed Structures: [10:Exposed: Yes Fascia: No Tendon: No Muscle: No Joint: No Bone: No Medium (34-66%)] [9:Exposed: Yes Fascia: No Tendon: No Muscle: No Joint: No Bone: No Small (1-33%)] [N/A:N/A] Epithelialization: [10:T Contact Cast otal] [9:Compression Therapy] [N/A:N/A] Treatment Notes Wound #10 (Left, Lateral, Plantar Foot) 1. Cleanse With Wound Cleanser Soap and water 3. Primary Dressing Applied Calcium Alginate Ag 4. Secondary Dressing ABD Pad Dry Gauze Drawtex 7. Footwear/Offloading device applied T Contact Cast otal Notes TCC applied by MD. Wound #9 (Right, Medial Foot) 1. Cleanse With Wound Cleanser 3. Primary Dressing Applied Calcium Alginate Ag 4. Secondary Dressing ABD Pad Dry Gauze Kerramax/Xtrasorb 6. Support Layer Applied 3 layer compression Cytogeneticist) Signed: 06/18/2019 5:52:05 PM By: Baltazar Najjar MD Signed: 06/19/2019 4:36:43 PM By: Yevonne Pax RN Entered By: Baltazar Najjar on 06/18/2019 17:39:30 -------------------------------------------------------------------------------- Multi-Disciplinary Care Plan Details Patient Name: Date of Service: [name], [name] 06/18/2019 3:30 PM Medical Record Number:  [id number] Patient Account Number: [id number] Date of Birth/Sex: Treating RN: May 29, 1955 (64 y.o. [name] Primary Care Saloma Cadena: MA SO UDI, [place] MA LSA DA T Other Clinician: Referring Demetri Goshert: Treating Iyla Balzarini/Extender: Baltazar Najjar MA SO UDI, ELHA MA LSA DA T Weeks in Treatment: 20 Active Inactive Wound/Skin Impairment Nursing Diagnoses: Impaired tissue integrity Knowledge deficit related to ulceration/compromised skin integrity Goals: Patient/caregiver will verbalize understanding of skin care regimen Date Initiated: 01/28/2019 Target Resolution Date: 07/12/2019 Goal Status: Active Interventions: Assess patient/caregiver ability to obtain necessary supplies Assess patient/caregiver ability to perform ulcer/skin care regimen upon admission and as needed Assess ulceration(s) every visit Provide education on ulcer and skin care Notes: Electronic Signature(s) Signed: 06/19/2019 4:36:43 PM By: Yevonne Pax RN Entered By: Yevonne Pax on 06/18/2019 15:49:59 -------------------------------------------------------------------------------- Pain Assessment Details Patient Name: Date of Service: [name], [name] 06/18/2019 3:30 PM Medical Record Number: [id number] Patient Account Number: [id number] Date of Birth/Sex: Treating RN: September 03, 1955 (64 y.o. [name] Primary Care Marque Rademaker: MA SO UDI, ELHA MA LSA DA T Other Clinician: Referring Carleah Yablonski: Treating  Deloria Brassfield/Extender: Baltazar Najjar MA SO UDI, ELHA MA LSA DA T Weeks in Treatment: 20 Active Problems Location of Pain Severity and Description of Pain Patient Has Paino No Site Locations Rate the pain. Current Pain Level: 0 Pain Management and Medication Current Pain Management: Medication: No Cold Application: No Rest: No Massage: No Activity: No T.E.N.S.: No Heat Application: No Leg drop or elevation: No Is the Current Pain Management Adequate: Adequate How does your wound impact your activities  of daily livingo Sleep: No Bathing: No Appetite: No Relationship With Others: No Bladder Continence: No Emotions: No Bowel Continence: No Work: No Toileting: No Drive: No Dressing: No Hobbies: No Electronic Signature(s) Signed: 06/18/2019 5:56:12 PM By: Shawn Stall Entered By: Shawn Stall on 06/18/2019 16:10:52 -------------------------------------------------------------------------------- Patient/Caregiver Education Details Patient Name: Date of Service: Gypsy Decant 6/1/2021andnbsp3:30 PM Medical Record Number: [id number] Patient Account Number: [id number] Date of Birth/Gender: Treating RN: [birth year] (64 y.o. [name] Primary Care Physician: MA SO UDI, Judene Companion MA LSA DA T Other Clinician: Referring Physician: Treating Physician/Extender: Baltazar Najjar MA SO UDI, ELHA MA LSA DA T Weeks in Treatment: 20 Education Assessment Education Provided To: Patient Education Topics Provided Wound/Skin Impairment: Methods: Explain/Verbal Responses: State content correctly Electronic Signature(s) Signed: 06/19/2019 4:36:43 PM By: Yevonne Pax RN Entered By: Yevonne Pax on 06/18/2019 15:50:18 -------------------------------------------------------------------------------- Wound Assessment Details Patient Name: Date of Service: [name], [name] 06/18/2019 3:30 PM Medical Record Number: [id number] Patient Account Number: [id number] Date of Birth/Sex: Treating RN: Apr 12, 1955 (64 y.o. [name] Primary Care Tyson Masin: MA SO UDI, [place] MA LSA DA T Other Clinician: Referring Kehlani Vancamp: Treating Carolena Fairbank/Extender: Baltazar Najjar MA SO UDI, ELHA MA LSA DA T Weeks in Treatment: 20 Wound Status Wound Number: 10 Primary Diabetic Wound/Ulcer of the Lower Extremity Etiology: Wound Location: Left, Lateral, Plantar Foot Wound Open Wounding Event: Blister Status: Date Acquired: 11/18/2018 Comorbid Glaucoma, Chronic sinus problems/congestion, Anemia, Sleep Weeks  Of Treatment: 20 History: Apnea, Hypertension, Type II Diabetes, Rheumatoid Arthritis, Clustered Wound: No Neuropathy Wound Measurements Length: (cm) 2.8 Width: (cm) 2.5 Depth: (cm) 0.3 Area: (cm) 5.498 Volume: (cm) 1.649 % Reduction in Area: -212.6% % Reduction in Volume: 37.5% Epithelialization: Medium (34-66%) Tunneling: No Undermining: No Wound Description Classification: Grade 2 Wound Margin: Distinct, outline attached Exudate Amount: Medium Exudate Type: Purulent Exudate Color: yellow, brown, green Foul Odor After Cleansing: No Slough/Fibrino Yes Wound Bed Granulation Amount: Large (67-100%) Exposed Structure Granulation Quality: Red Fascia Exposed: No Necrotic Amount: Small (1-33%) Fat Layer (Subcutaneous Tissue) Exposed: Yes Necrotic Quality: Adherent Slough Tendon Exposed: No Muscle Exposed: No Joint Exposed: No Bone Exposed: No Treatment Notes Wound #10 (Left, Lateral, Plantar Foot) 1. Cleanse With Wound Cleanser Soap and water 3. Primary Dressing Applied Calcium Alginate Ag 4. Secondary Dressing ABD Pad Dry Gauze Drawtex 7. Footwear/Offloading device applied T Contact Cast otal Notes TCC applied by MD. Electronic Signature(s) Signed: 06/18/2019 5:56:12 PM By: Shawn Stall Entered By: Shawn Stall on 06/18/2019 16:16:31 -------------------------------------------------------------------------------- Wound Assessment Details Patient Name: Date of Service: [name], [name] 06/18/2019 3:30 PM Medical Record Number: [id number] Patient Account Number: [id number] Date of Birth/Sex: Treating RN: 06-Mar-1955 (64 y.o. [name] Primary Care Zakyria Metzinger: MA SO UDI, [place] MA LSA DA T Other Clinician: Referring Allycia Pitz: Treating Kristy Schomburg/Extender: Baltazar Najjar MA SO UDI, ELHA MA LSA DA T Weeks in Treatment: 20 Wound Status Wound Number: 9 Primary Diabetic Wound/Ulcer of the Lower Extremity Etiology: Wound Location: Right, Medial Foot Wound  Open Wounding Event: Blister Status: Date Acquired:  06/18/2018 Comorbid Glaucoma, Chronic sinus problems/congestion, Anemia, Sleep Weeks Of Treatment: 20 History: Apnea, Hypertension, Type II Diabetes, Rheumatoid Arthritis, Clustered Wound: No Neuropathy Wound Measurements Length: (cm) 2.8 Width: (cm) 2.5 Depth: (cm) 0.3 Area: (cm) 5.498 Volume: (cm) 1.649 % Reduction in Area: 50% % Reduction in Volume: 83.3% Epithelialization: Small (1-33%) Tunneling: No Undermining: No Wound Description Classification: Grade 2 Wound Margin: Distinct, outline attached Exudate Amount: Medium Exudate Type: Serosanguineous Exudate Color: red, brown Foul Odor After Cleansing: No Slough/Fibrino No Wound Bed Granulation Amount: Large (67-100%) Exposed Structure Granulation Quality: Red, Pink Fascia Exposed: No Necrotic Amount: None Present (0%) Fat Layer (Subcutaneous Tissue) Exposed: Yes Tendon Exposed: No Muscle Exposed: No Joint Exposed: No Bone Exposed: No Treatment Notes Wound #9 (Right, Medial Foot) 1. Cleanse With Wound Cleanser 3. Primary Dressing Applied Calcium Alginate Ag 4. Secondary Dressing ABD Pad Dry Gauze Kerramax/Xtrasorb 6. Support Layer Applied 3 layer compression wrap Electronic Signature(s) Signed: 06/18/2019 5:56:12 PM By: Shawn Stall Entered By: Shawn Stall on 06/18/2019 16:15:01 -------------------------------------------------------------------------------- Vitals Details Patient Name: Date of Service: [name] [name], Leniyah J. 06/18/2019 3:30 PM Medical Record Number: [id number] Patient Account Number: [id number] Date of Birth/Sex: Treating RN: 1955/09/13 (64 y.o. Debara Pickett, Millard.Loa Primary Care Jelani Trueba: MA SO UDI, [place] MA LSA DA T Other Clinician: Referring Rondrick Barreira: Treating Zuri Lascala/Extender: Baltazar Najjar MA SO UDI, ELHA MA LSA DA T Weeks in Treatment: 20 Vital Signs Time Taken: 16:00 Temperature (F): 98.1 Height (in): 68 Pulse (bpm):  57 Weight (lbs): 265 Respiratory Rate (breaths/min): 18 Body Mass Index (BMI): 40.3 Blood Pressure (mmHg): 124/55 Capillary Blood Glucose (mg/dl): 157 Reference Range: 80 - 120 mg / dl Electronic Signature(s) Signed: 06/18/2019 5:56:12 PM By: Shawn Stall Entered By: Shawn Stall on 06/18/2019 16:10:38

## 2019-06-19 NOTE — Progress Notes (Signed)
MARYHELEN, LINDLER ([id number]) Visit Report for 06/18/2019 HPI Details Patient Name: Date of Service: [name], [name] 06/18/2019 3:30 PM Medical Record Number: [id number] Patient Account Number: [id number] Date of Birth/Sex: Treating RN: 1955/08/15 (64 y.o. [name] Primary Care Provider: MA SO UDI, [place] MA LSA DA T Other Clinician: Referring Provider: Treating Provider/Extender: Baltazar Najjar MA SO UDI, ELHA MA LSA DA T Weeks in Treatment: 20 History of Present Illness HPI Description: 64 yrs old bf here for follow up recurrent left charcot foot ulcer. She has DM. She is not a smoker. She states a blister developed 3 months ago at site of previous breakdown from 1 year ago. It was debrided at the podiatrist's office. She went to the ER and then sent here. She denies pain. Xray was negative for osteo. Culture from this clinic negative. 09/08/[name] to [name] for Charcot foot. Seen by him and MRI scheduled 09/19/14. Prior silver collagen but this was not ordered last visit, patient has been rinsing with saline alone. Re institute silver collagen every other day. F/u 2 weeks with [name] as Labor Day holiday. Supplies ordered. 09/25/14; this is a patient with a Charcot foot on the left. she has a wound over the plantar aspect of her left calcaneus. She has been seen by [name] of orthopedic surgery and has had an MRI. She is apparently going within the next week or 2 for corrective surgery which will involve an external fixator. I don't have any of the details of this. 10/06/14; The patient is a 64 yrs old bf with a left Charcot foot and ulcer on the plantar. 12/01/14 Returns post surgery with [name]. Has follow up visit later this week with him, currently in facility and NWB over this extremity. States no drainage from foot and no current wound care. On exam callus and scab in place, suspect healed. Has external fixator in place. Will defer to [name]  for management, ok to place moisturizing cream over scabs and callus and she will call if she requires follow up here. READMISSION 01/28/2019 Patient was in the clinic in 2016 for a wound on the plantar aspect of her left calcaneus. Predominantly cared for by [name] she was referred to [name] and had corrective surgery for the position of her left ankle I believe. She had previously been here in 2015 and then prior to that in 2011 2012 that I do not have these records. More recently she has developed fairly extensive wounds on the right medial foot, left lateral foot and a small area on the right medial great toe. Right medial foot wound has been there for 6 to 7 months, left foot wound for 2 to 3 months and the right great toe only over the last few weeks. She has been followed by [name] at Dr. Laverta [place] office it sounds as though she has been undergoing callus paring and silver nitrate. The patient has been washing these off with soap and water and plan applying dry dressing Past medical history, type 2 diabetes well controlled with a recent hemoglobin A1c of 7 on 11/16, type 2 diabetes with peripheral neuropathy, previous left Charcot joint surgery bilateral Charcot joints currently, stage III chronic renal failure, hypertension, obstructive sleep apnea, history of MRSA and gastroesophageal reflux. 1/18; this is a patient with bilateral Charcot feet. Plain x-rays that I did of both of these wounds that are either at bone or precariously close to bone  were done. On the right foot this showed possible lytic destruction involving the anterior aspect of the talus which may represent a wound osteomyelitis. An MRI was recommended. On the left there was no definite lytic destruction although the wound is deep undermining's and I think probably requires an MRI on this basis in any case 1/25; patient was supposed to have her MRI last Friday of her bilateral feet however they would not do  it because there was silver alginate in her dressings, will replace with calcium alginate today and will see if we can get her done today 2/1; patient did not get her MRIs done last week because of issues with the MRI department receiving orders to do bilateral feet. She has 2 deep wounds in the setting of Charcot feet deformities bilaterally. Both of them at one point had exposed bone although I had trouble demonstrating that today. I am not sure that we can offload these very aggressively as I watched her leave the clinic last week her gait is very narrow and unsteady. 2/15; the MRI of her right foot did not show osteomyelitis potentially osteonecrosis of the talus. 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 The wounds are a large wound on the right medial foot and on the left a substantial wound on the left lateral plantar calcaneus. We have been using silver alginate. Ultimately I think we need to consider a total contact cast on the right while we work through the possibility of osteomyelitis in the left. I watched her walk out with her crutches I have some trepidation about the ability to walk with the cast on her right foot. Nevertheless with her Charcot deformities I do not think there is a way to heal these short of offloading them aggressively. 2/22; the culture of the bone in the left foot that I did last week showed a few Citrobacter Koseri as well as some streptococcal species. In my attempted bone for pathology did not actually yield a lot of bone the pathologist did not identify any osteomyelitis. 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 Is promised today and aggressive debridement of the right foot and a total contact cast which surprisingly she seemed to tolerate quite well 03/13/2019 upon  evaluation today patient appears to be doing well with her total contact cast she is here for the first cast change. She had no areas of rubbing or any other complications at this point. Overall things are doing quite well. 2/26; the patient was kindly seen by Dr. Megan Salon of infectious disease on 2/25. She is now on IV vancomycin PICC line placement in 2 days and oral Flagyl. This was in response to her MRI showing osteomyelitis of the left calcaneus concerning for osteomyelitis. We have been putting her right leg in a total contact cast. Our nurses report drainage. She is tolerating the total contact cast well. 3/4; the patient is started her IV antibiotics and is taking IV vancomycin and oral Flagyl. She appears to be tolerating these both well. This is for osteomyelitis involving the left calcaneus. The area on the right in the setting of bilateral Charcot deformities did not have any bone infection on MRI. We put a total contact cast on her with silver alginate as the primary dressing and she returns today in follow-up. Per  our intake nurse there was too much drainage to leave this on all week therefore we will bring her back for an early change 3/8; follow-up total contact cast she is still having a lot of drainage probably too much drainage from the right foot wound will week with the same cast. She will be back on Thursday. The area on the left looks somewhat better 3/11; patient here for a total contact cast change. 3/15; patient came in for wound care evaluation. She is still on IV vancomycin and oral Flagyl for osteomyelitis in the left foot. The area on the right foot we are putting in a total contact cast. 3/18 for total contact cast change on the right foot 3/22; the patient was here for wound review today. The area on the right foot is slightly smaller in terms of surface area. However the surface of the wound is very gritty and hyper granulated. More problematically the area on the  plantar left foot has increased depth indeed in the center of this it probes to bone. We have been using Hydrofera Blue in both areas. She is on antibiotics as prescribed by infectious disease IV vancomycin and oral Flagyl 3/25; the patient is in for total contact cast change. Our intake nurse was concerned about the amount of drainage which soaked right to the multiple layers we are putting on this. Wondered about options. The wound bed actually looks as healthy as this is looked although there may not be a lot of change in dimensions things are looking a lot better. If we are going to heal this woman's wound which is in the middle of her right Charcot foot this is going to need to be offloaded. There are not many alternatives 3/29; still not a lot of improvement although the surface of the right wound looks better. We have been using Hydrofera Blue 04/17/2019 upon evaluation today patient appears to be doing well with a total contact cast. With actually having to change this twice a week. She saw Dr. Dellia Nims for wound care earlier in the week this is for the cast change today. That is due to the fact that she has significant drainage. Overall the wound is been looking excellent however. 4/5; we continue to put a total contact cast on this patient with Hydrofera Blue. Still a lot of drainage which precludes a simple weekly change or changing this twice a week. 4/8; total contact cast changed today. Apparently the wound looks better per our intake nurse 4/12; patient here for wound care evaluation. Wounds on the right foot have not changed in dimension none about a month left foot looks similar. Apparently her antibiotics that we are giving her for osteomyelitis in the left foot complete on Wednesday. We have been using Hydrofera Blue on the right foot under a total contact cast. Nurses report greenish drainage although I think this may be discoloration from the [facility] 4/15; back for cast  change wounds look quite good although still moderate amount of drainage. T contact cast reapplied otal 4/19;. Wound evaluation. Wound on the right foot is smaller surface looks healthy. There is still the divot in the middle part of this wound but I could not feel any bone. Area on the left foot also looks better She has completed her antibiotics but still has the PICC line in. 4/23; patient comes back for a total contact cast change this was done in the standard fashion no issues 4/26; wound measures slightly larger on the right  foot which is disappointing. She sees Dr. Orvan Falconer with regards to her antibiotics on Wednesday. I believe she is on vancomycin IV and oral Flagyl. 4/29; in for her routine total contact cast change. She saw Dr. Orvan Falconer this morning. He is asking about antibiotic continuation I will be in touch with him. I did not look at her wound today 5/3; patient saw Dr. Orvan Falconer on 4/28. I did send him a note asking about the continuation of perhaps an oral regimen. I have not heard back. She still has an elevated sedimentation rate at 81. Her wounds are smaller. 5/6; in for her total contact cast change. We did not look at the wound today. She has seen Dr. Orvan Falconer and is now on oral Keflex and Flagyl. She seems to be tolerating these well 5/10; she had her PICC line removed. The wound on the right foot after some initial improvement has not made any improvement even with the total contact cast. Still having a lot of drainage. Likewise the area on the left foot really is not any better either 5/14; in for a cast change today. We will look at the right foot on Monday. If this is not making any progress I am going to try to cast the left foot. 5/17; the wound on the right foot is absolutely no different. I am going to put ointment on this area and change the cast of the left foot. We have been using silver alginate there 5/21; apparently the area on the right foot was better per our  intake nurse although I did not see it. We did a standard total contact cast change on the left foot we have been applying silver alginate to the wound here. 06/10/19-Area on the right foot per dimensions slightly better, the left foot has still some depth applying silver alginate to the wound with TCC 06/13/19-Patient here for Casting to Left foot 6/1; patient had the total contact cast were placed on the left foot. Noted some swelling in her right leg although the dimensions of the wound on the right foot are better. We have been using PolyMem on the right and silver alginate on the left under the cast Electronic Signature(s) Signed: 06/18/2019 5:52:05 PM By: Baltazar Najjar MD Entered By: Baltazar Najjar on 06/18/2019 17:40:15 -------------------------------------------------------------------------------- Physical Exam Details Patient Name: Date of Service: [name], Dazha J. 06/18/2019 3:30 PM Medical Record Number: [id number] Patient Account Number: [id number] Date of Birth/Sex: Treating RN: October 03, 1955 (65 y.o. [name] Primary Care Provider: MA SO UDI, [place] MA LSA DA T Other Clinician: Referring Provider: Treating Provider/Extender: Baltazar Najjar MA SO UDI, ELHA MA LSA DA T Weeks in Treatment: 20 Constitutional Sitting or standing Blood Pressure is within target range for patient.. Pulse regular and within target range for patient.Marland Kitchen Respirations regular, non-labored and within target range.. Temperature is normal and within the target range for the patient.Marland Kitchen Appears in no distress. Notes Wound exam; left plantar foot has come in in depth. No debridement is necessary. This is quite a big improvement. No debridement required on the right foot either. This appears to be smaller in terms of surface area. Noted edema in her right leg but without evidence of a DVT I therefore elected to put the wound on the bottom of her foot and her leg under some . compression to see if that would  help with padding the area and reducing the swelling in her leg. Electronic Signature(s) Signed: 06/18/2019 5:52:05 PM By: Baltazar Najjar MD Entered  By: Baltazar Najjar on 06/18/2019 17:41:19 -------------------------------------------------------------------------------- Physician Orders Details Patient Name: Date of Service: ENO [facility], [name] J. 06/18/2019 3:30 PM Medical Record Number: [id number] Patient Account Number: [id number] Date of Birth/Sex: Treating RN: 1955/12/19 (64 y.o. [name] Primary Care Provider: MA SO UDI, [place] MA LSA DA T Other Clinician: Referring Provider: Treating Provider/Extender: Baltazar Najjar MA SO UDI, ELHA MA LSA DA T Weeks in Treatment: 20 Verbal / Phone Orders: No Diagnosis Coding ICD-10 Coding Code Description E11.621 Type 2 diabetes mellitus with foot ulcer L97.524 Non-pressure chronic ulcer of other part of left foot with necrosis of bone L97.511 Non-pressure chronic ulcer of other part of right foot limited to breakdown of skin L97.514 Non-pressure chronic ulcer of other part of right foot with necrosis of bone E11.42 Type 2 diabetes mellitus with diabetic polyneuropathy M14.671 Charcot's joint, right ankle and foot M14.672 Charcot's joint, left ankle and foot 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 Dressing Change Frequency Wound #10 Left,Lateral,Plantar Foot Do not change entire dressing for one week. Wound #9 Right,Medial Foot Change Dressing every other day. Wound Cleansing May shower with protection. Primary Wound Dressing Wound #10 Left,Lateral,Plantar Foot Calcium Alginate with Silver Wound #9 Right,Medial Foot Polymem Silver Secondary Dressing Wound #10 Left,Lateral,Plantar Foot Foam Kerlix/Rolled Gauze Dry Gauze Drawtex Wound #9 Right,Medial Foot Kerlix/Rolled Gauze ABD pad Zetuvit or Kerramax Edema Control 3 Layer Compression System - Right  Lower Extremity Off-Loading Total Contact Cast to Left Lower Extremity Open toe surgical shoe to: - foot Electronic Signature(s) Signed: 06/18/2019 5:52:05 PM By: Baltazar Najjar MD Signed: 06/19/2019 4:36:43 PM By: Yevonne Pax RN Entered By: Yevonne Pax on 06/18/2019 17:06:32 -------------------------------------------------------------------------------- Problem List Details Patient Name: Date of Service: [facility], Brionne J. 06/18/2019 3:30 PM Medical Record Number: [id number] Patient Account Number: [id number] Date of Birth/Sex: Treating RN: March 11, 1955 (64 y.o. [name] Primary Care Provider: MA SO UDI, Judene Compan[place] MA LSA DA T Other Clinician: Referring Provider: Treating Provider/Extender: Abe People, ELHA MA LSA DA T Weeks in Treatment: 20 Active Problems ICD-10 Encounter Code Description Active Date MDM Diagnosis E11.621 Type 2 diabetes mellitus with foot ulcer 01/28/2019 No Yes L97.524 Non-pressure chronic ulcer of other part of left foot with necrosis of bone 01/28/2019 No Yes L97.511 Non-pressure chronic ulcer of other part of right foot limited to breakdown of 01/28/2019 No Yes skin L97.514 Non-pressure chronic ulcer of other part of right foot with necrosis of bone 01/28/2019 No Yes E11.42 Type 2 diabetes mellitus with diabetic polyneuropathy 01/28/2019 No Yes M14.671 Charcot's joint, right ankle and foot 01/28/2019 No Yes M14.672 Charcot's joint, left ankle and foot 01/28/2019 No Yes 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 06/18/2019 3:30 PM Medical Record Number: [id number] Patient Account Number: [id number] Date  of Birth/Sex: Treating RN: [birth year] (64 y.o. [name] Primary Care Provider: MA SO UDI, [place] MA LSA DA T Other Clinician: Referring Provider: Treating Provider/Extender: Baltazar Najjar MA SO UDI, ELHA MA LSA DA T Weeks in Treatment: 20 Subjective History of Present Illness (HPI) 64 yrs old bf here for follow up recurrent left charcot foot ulcer. She has DM. She is not a smoker. She states a blister developed 3 months ago at site of previous  breakdown from 1 year ago. It was debrided at the podiatrist's office. She went to the ER and then sent here. She denies pain. Xray was negative for osteo. Culture from this clinic negative. 09/08/14 Referred by [name] to [name] for Charcot foot. Seen by him and MRI scheduled 09/19/14. Prior silver collagen but this was not ordered last visit, patient has been rinsing with saline alone. Re institute silver collagen every other day. F/u 2 weeks with Dr. Dellia Nims as Labor Day holiday. Supplies ordered. 09/25/14; this is a patient with a Charcot foot on the left. she has a wound over the plantar aspect of her left calcaneus. She has been seen by [name] of orthopedic surgery and has had an MRI. She is apparently going within the next week or 2 for corrective surgery which will involve an external fixator. I don't have any of the details of this. 10/06/14; The patient is a 64 yrs old bf with a left Charcot foot and ulcer on the plantar. 12/01/14 Returns post surgery with [name]. Has follow up visit later this week with him, currently in facility and NWB over this extremity. States no drainage from foot and no current wound care. On exam callus and scab in place, suspect healed. Has external fixator in place. Will defer to [name] for management, ok to place moisturizing cream over scabs and callus and she will call if she requires follow up here. READMISSION 01/28/2019 Patient was in the clinic in 2016 for a wound on the plantar aspect of  her left calcaneus. Predominantly cared for by Dr. [name] she was referred to [name] and had corrective surgery for the position of her left ankle I believe. She had previously been here in 2015 and then prior to that in 2011 2012 that I do not have these records. More recently she has developed fairly extensive wounds on the right medial foot, left lateral foot and a small area on the right medial great toe. Right medial foot wound has been there for 6 to 7 months, left foot wound for 2 to 3 months and the right great toe only over the last few weeks. She has been followed by [name] at Dr. [name] office it sounds as though she has been undergoing callus paring and silver nitrate. The patient has been washing these off with soap and water and plan applying dry dressing Past medical history, type 2 diabetes well controlled with a recent hemoglobin A1c of 7 on 11/16, type 2 diabetes with peripheral neuropathy, previous left Charcot joint surgery bilateral Charcot joints currently, stage III chronic renal failure, hypertension, obstructive sleep apnea, history of MRSA and gastroesophageal reflux. 1/18; this is a patient with bilateral Charcot feet. Plain x-rays that I did of both of these wounds that are either at bone or precariously close to bone were done. On the right foot this showed possible lytic destruction involving the anterior aspect of the talus which may represent a wound osteomyelitis. An MRI was recommended. On the left there was no definite lytic destruction although the wound is deep undermining's and I think probably requires an MRI on this basis in any case 1/25; patient was supposed to have her MRI last Friday of her bilateral feet however they would not do it because there was silver alginate in her dressings, will replace with calcium alginate today and will see if we can get her done today 2/1; patient did not get her MRIs done last week because  of issues with the  MRI department receiving orders to do bilateral feet. She has 2 deep wounds in the setting of Charcot feet deformities bilaterally. Both of them at one point had exposed bone although I had trouble demonstrating that today. I am not sure that we can offload these very aggressively as I watched her leave the clinic last week her gait is very narrow and unsteady. 2/15; the MRI of her right foot did not show osteomyelitis potentially osteonecrosis of the talus. 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 The wounds are a large wound on the right medial foot and on the left a substantial wound on the left lateral plantar calcaneus. We have been using silver alginate. Ultimately I think we need to consider a total contact cast on the right while we work through the possibility of osteomyelitis in the left. I watched her walk out with her crutches I have some trepidation about the ability to walk with the cast on her right foot. Nevertheless with her Charcot deformities I do not think there is a way to heal these short of offloading them aggressively. 2/22; the culture of the bone in the left foot that I did last week showed a few Citrobacter Koseri as well as some streptococcal species. In my attempted bone for pathology did not actually yield a lot of bone the pathologist did not identify any osteomyelitis. 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 Is promised today and aggressive debridement of the right foot and a total contact cast which surprisingly she seemed to tolerate quite well 03/13/2019 upon evaluation today patient appears to be doing well with her total contact cast she is here for the first cast change. She had no areas of rubbing or any other complications at this point. Overall things are doing quite  well. 2/26; the patient was kindly seen by Dr. Orvan Falconer of infectious disease on 2/25. She is now on IV vancomycin PICC line placement in 2 days and oral Flagyl. This was in response to her MRI showing osteomyelitis of the left calcaneus concerning for osteomyelitis. We have been putting her right leg in a total contact cast. Our nurses report drainage. She is tolerating the total contact cast well. 3/4; the patient is started her IV antibiotics and is taking IV vancomycin and oral Flagyl. She appears to be tolerating these both well. This is for osteomyelitis involving the left calcaneus. The area on the right in the setting of bilateral Charcot deformities did not have any bone infection on MRI. We put a total contact cast on her with silver alginate as the primary dressing and she returns today in follow-up. Per our intake nurse there was too much drainage to leave this on all week therefore we will bring her back for an early change 3/8; follow-up total contact cast she is still having a lot of drainage probably too much drainage from the right foot wound will week with the same cast. She will be back on Thursday. The area on the left looks somewhat better 3/11; patient here for a total contact cast change. 3/15; patient came in for wound care evaluation. She is still on IV vancomycin and oral Flagyl for osteomyelitis in the left foot. The area on the right foot we are putting in a total contact  cast. 3/18 for total contact cast change on the right foot 3/22; the patient was here for wound review today. The area on the right foot is slightly smaller in terms of surface area. However the surface of the wound is very gritty and hyper granulated. More problematically the area on the plantar left foot has increased depth indeed in the center of this it probes to bone. We have been using Hydrofera Blue in both areas. She is on antibiotics as prescribed by infectious disease IV vancomycin and oral  Flagyl 3/25; the patient is in for total contact cast change. Our intake nurse was concerned about the amount of drainage which soaked right to the multiple layers we are putting on this. Wondered about options. The wound bed actually looks as healthy as this is looked although there may not be a lot of change in dimensions things are looking a lot better. If we are going to heal this woman's wound which is in the middle of her right Charcot foot this is going to need to be offloaded. There are not many alternatives 3/29; still not a lot of improvement although the surface of the right wound looks better. We have been using Hydrofera Blue 04/17/2019 upon evaluation today patient appears to be doing well with a total contact cast. With actually having to change this twice a week. She saw [name] for wound care earlier in the week this is for the cast change today. That is due to the fact that she has significant drainage. Overall the wound is been looking excellent however. 4/5; we continue to put a total contact cast on this patient with Hydrofera Blue. Still a lot of drainage which precludes a simple weekly change or changing this twice a week. 4/8; total contact cast changed today. Apparently the wound looks better per our intake nurse 4/12; patient here for wound care evaluation. Wounds on the right foot have not changed in dimension none about a month left foot looks similar. Apparently her antibiotics that we are giving her for osteomyelitis in the left foot complete on Wednesday. We have been using Hydrofera Blue on the right foot under a total contact cast. Nurses report greenish drainage although I think this may be discoloration from the [facility] 4/15; back for cast change wounds look quite good although still moderate amount of drainage. T contact cast reapplied otal 4/19;. Wound evaluation. Wound on the right foot is smaller surface looks healthy. There is still the divot in the  middle part of this wound but I could not feel any bone. Area on the left foot also looks better She has completed her antibiotics but still has the PICC line in. 4/23; patient comes back for a total contact cast change this was done in the standard fashion no issues 4/26; wound measures slightly larger on the right foot which is disappointing. She sees Dr. Orvan Falconer with regards to her antibiotics on Wednesday. I believe she is on vancomycin IV and oral Flagyl. 4/29; in for her routine total contact cast change. She saw Dr. Orvan Falconer this morning. He is asking about antibiotic continuation I will be in touch with him. I did not look at her wound today 5/3; patient saw Dr. Orvan Falconer on 4/28. I did send him a note asking about the continuation of perhaps an oral regimen. I have not heard back. She still has an elevated sedimentation rate at 81. Her wounds are smaller. 5/6; in for her total contact cast change. We did  not look at the wound today. She has seen Dr. Campbell and is now on oral KOrvan Falconer and Flagyl. She seems to be tolerating these well 5/10; she had her PICC line removed. The wound on the right foot after some initial improvement has not made any improvement even with the total contact cast. Still having a lot of drainage. Likewise the area on the left foot really is not any better either 5/14; in for a cast change today. We will look at the right foot on Monday. If this is not making any progress I am going to try to cast the left foot. 5/17; the wound on the right foot is absolutely no different. I am going to put ointment on this area and change the cast of the left foot. We have been using silver alginate there 5/21; apparently the area on the right foot was better per our intake nurse although I did not see it. We did a standard total contact cast change on the left foot we have been applying silver alginate to the wound here. 06/10/19-Area on the right foot per dimensions slightly  better, the left foot has still some depth applying silver alginate to the wound with TCC 06/13/19-Patient here for Casting to Left foot 6/1; patient had the total contact cast were placed on the left foot. Noted some swelling in her right leg although the dimensions of the wound on the right foot are better. We have been using PolyMem on the right and silver alginate on the left under the cast Objective Constitutional Sitting or standing Blood Pressure is within target range for patient.. Pulse regular and within target range for patient.Marland Kitchen Respirations regular, non-labored and within target range.. Temperature is normal and within the target range for the patient.Marland Kitchen Appears in no distress. Vitals Time Taken: 4:00 PM, Height: 68 in, Weight: 265 lbs, BMI: 40.3, Temperature: 98.1 F, Pulse: 57 bpm, Respiratory Rate: 18 breaths/min, Blood Pressure: 124/55 mmHg, Capillary Blood Glucose: 130 mg/dl. General Notes: Wound exam; left plantar foot has come in in depth. No debridement is necessary. This is quite a big improvement. ooNo debridement required on the right foot either. This appears to be smaller in terms of surface area. ooNoted edema in her right leg but without evidence of a DVT I therefore elected to . put the wound on the bottom of her foot and her leg under some compression to see if that would help with padding the area and reducing the swelling in her leg. Integumentary (Hair, Skin) Wound #10 status is Open. Original cause of wound was Blister. The wound is located on the Left,Lateral,Plantar Foot. The wound measures 2.8cm length x 2.5cm width x 0.3cm depth; 5.498cm^2 area and 1.649cm^3 volume. There is Fat Layer (Subcutaneous Tissue) Exposed exposed. There is no tunneling or undermining noted. There is a medium amount of purulent drainage noted. The wound margin is distinct with the outline attached to the wound base. There is large (67-100%) red granulation within the wound bed. There  is a small (1-33%) amount of necrotic tissue within the wound bed including Adherent Slough. Wound #9 status is Open. Original cause of wound was Blister. The wound is located on the Right,Medial Foot. The wound measures 2.8cm length x 2.5cm width x 0.3cm depth; 5.498cm^2 area and 1.649cm^3 volume. There is Fat Layer (Subcutaneous Tissue) Exposed exposed. There is no tunneling or undermining noted. There is a medium amount of serosanguineous drainage noted. The wound margin is distinct with the outline attached to the  wound base. There is large (67-100%) red, pink granulation within the wound bed. There is no necrotic tissue within the wound bed. 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 There was a Three Layer Compression Therapy Procedure by Yevonne Pax, RN. Post procedure Diagnosis Wound #9: Same as Pre-Procedure Wound #10 Pre-procedure diagnosis of Wound #10 is a Diabetic Wound/Ulcer of the Lower Extremity located on the Left,Lateral,Plantar Foot . There was a T Contact otal Cast Procedure by [name], MD. Post procedure Diagnosis Wound #10: Same as Pre-Procedure Plan Follow-up Appointments: Return Appointment in 1 week. Dressing Change Frequency: Wound #10 Left,Lateral,Plantar Foot: Do not change entire dressing for one week. Wound #9 Right,Medial Foot: Change Dressing every other  day. Wound Cleansing: May shower with protection. Primary Wound Dressing: Wound #10 Left,Lateral,Plantar Foot: Calcium Alginate with Silver Wound #9 Right,Medial Foot: Polymem Silver Secondary Dressing: Wound #10 Left,Lateral,Plantar Foot: Foam Kerlix/Rolled Gauze Dry Gauze Drawtex Wound #9 Right,Medial Foot: Kerlix/Rolled Gauze ABD pad Zetuvit or Kerramax Edema Control: 3 Layer Compression System - Right Lower Extremity Off-Loading: T Contact Cast to Left Lower Extremity otal Open toe surgical shoe to: - foot 1. Continue with PolyMem to the right foot and silver alginate on the left 2. T contact cast on the left otal 3. I elected to wrap her right leg in 3 layer compression. I will be interested in knowing whether this helps the wound on the right plantar foot Electronic Signature(s) Signed: 06/18/2019 5:52:05 PM By: Baltazar Najjar MD Entered By: Baltazar Najjar on 06/18/2019 17:42:01 -------------------------------------------------------------------------------- Total Contact Cast Details Patient Name: Date of Service: [name], Afrah J. 06/18/2019 3:30 PM Medical Record Number: [id number] Patient Account Number: [id number] Date of Birth/Sex: Treating RN: 23-Dec-1955 (64 y.o. [name] Primary Care Provider: MA SO UDI, [place] MA LSA DA T Other Clinician: Referring Provider: Treating Provider/Extender: Baltazar Najjar MA SO UDI, ELHA MA LSA DA T Weeks in Treatment: 20 T Contact Cast Applied for Wound Assessment: otal Wound #10 Left,Lateral,Plantar Foot Performed By: Physician [name], MD Post Procedure Diagnosis Same as Pre-procedure Electronic Signature(s) Signed: 06/18/2019 5:52:05 PM By: Baltazar Najjar MD Entered By: Baltazar Najjar on 06/18/2019 17:39:39 -------------------------------------------------------------------------------- SuperBill Details Patient Name: Date of Service: [facility], Mysha J. 06/18/2019 Medical Record Number:  [id number] Patient Account Number: [id number] Date of Birth/Sex: Treating RN: [birth year] (64 y.o. [name] Primary Care Provider: MA SO UDI, [place] MA LSA DA T Other Clinician: Referring Provider: Treating Provider/Extender: Abe People, ELHA MA LSA DA T Weeks in Treatment: 20 Diagnosis Coding ICD-10 Codes Code Description E11.621 Type 2 diabetes mellitus with foot ulcer L97.524 Non-pressure chronic ulcer of other part of left foot with necrosis of bone L97.511 Non-pressure chronic ulcer of other part of right foot limited to breakdown of skin L97.514 Non-pressure chronic ulcer of other part of right foot with necrosis of bone E11.42 Type 2 diabetes mellitus with diabetic polyneuropathy M14.671 Charcot's joint, right ankle and foot  Z61.096 Charcot's joint, left ankle and foot 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, left ankle and foot Facility Procedures CPT4 Code: 04540981 Description: 29445 - APPLY TOTAL CONTACT LEG CAST ICD-10 Diagnosis Description E11.621 Type 2 diabetes mellitus with foot ulcer Modifier: Quantity: 1 Physician Procedures : CPT4 Code Description Modifier [id number] 29445 - WC PHYS APPLY TOTAL CONTACT CAST ICD-10 Diagnosis Description E11.621 Type 2 diabetes mellitus with foot ulcer Quantity: 1 Electronic Signature(s) Signed: 06/18/2019 5:52:05 PM By: Baltazar Najjar MD Entered By: Baltazar Najjar on 06/18/2019 17:42:13

## 2019-06-20 NOTE — Progress Notes (Signed)
Sarah, [name] ([id number]) Visit Report for 06/13/2019 Arrival Information Details Patient Name: Date of Service: [name], [name] 06/13/2019 1:30 PM Medical Record Number: [id number] Patient Account Number: [id number] Date of Birth/Sex: Treating RN: [birth year] (64 y.o. Debara Pickett, Millard.Loa Primary Care Areen Trautner: MA SO UDI, ELHA MA LSA DA T Other Clinician: Referring Tawyna Pellot: Treating Matalyn Nawaz/Extender: Cassandria Anger MA SO UDI, ELHA MA LSA DA T Weeks in Treatment: 51 Visit Information History Since Last Visit Added or deleted any medications: No Patient Arrived: Crutches Any new allergies or adverse reactions: No Arrival Time: 14:08 Had a fall or experienced change in No Accompanied By: self activities of daily living that may affect Transfer Assistance: None risk of falls: Patient Identification Verified: Yes Signs or symptoms of abuse/neglect since last visito No Secondary Verification Process Completed: Yes Hospitalized since last visit: No Patient Requires Transmission-Based Precautions: No Implantable device outside of the clinic excluding No Patient Has Alerts: No cellular tissue based products placed in the center since last visit: Has Dressing in Place as Prescribed: Yes Pain Present Now: No Electronic Signature(s) Signed: 06/20/2019 9:13:31 AM By: Karl Ito Entered By: Karl Ito on 06/13/2019 14:09:25 -------------------------------------------------------------------------------- Encounter Discharge Information Details Patient Name: Date of Service: Sarah CH, [name] 06/13/2019 1:30 PM Medical Record Number: [id number] Patient Account Number: [id number] Date of Birth/Sex: Treating RN: December 22, 1955 (64 y.o. [name] Primary Care Bryanne Riquelme: MA SO UDI, ELHA MA LSA DA T Other Clinician: Referring Cleven Jansma: Treating Adelita Hone/Extender: Cassandria Anger MA SO UDI, ELHA MA LSA DA T Weeks in Treatment: 11 Encounter Discharge Information  Items Discharge Condition: Stable Ambulatory Status: Crutches Discharge Destination: Home Transportation: Private Auto Accompanied By: self Schedule Follow-up Appointment: Yes Clinical Summary of Care: Electronic Signature(s) Signed: 06/13/2019 5:42:52 PM By: Shawn Stall Entered By: Shawn Stall on 06/13/2019 14:38:42 -------------------------------------------------------------------------------- Multi-Disciplinary Care Plan Details Patient Name: Date of Service: [facility], [name] 06/13/2019 1:30 PM Medical Record Number: [id number] Patient Account Number: [id number] Date of Birth/Sex: Treating RN: 1955/09/08 (64 y.o. [name] Primary Care Jeanmarie Mccowen: MA SO UDI, [place] MA LSA DA T Other Clinician: Referring Adamari Frede: Treating Tavarus Poteete/Extender: Cassandria Anger MA SO UDI, ELHA MA LSA DA T Weeks in Treatment: 25 Active Inactive Wound/Skin Impairment Nursing Diagnoses: Impaired tissue integrity Knowledge deficit related to ulceration/compromised skin integrity Goals: Patient/caregiver will verbalize understanding of skin care regimen Date Initiated: 01/28/2019 Target Resolution Date: 07/12/2019 Goal Status: Active Interventions: Assess patient/caregiver ability to obtain necessary supplies Assess patient/caregiver ability to perform ulcer/skin care regimen upon admission and as needed Assess ulceration(s) every visit Provide education on ulcer and skin care Notes: Electronic Signature(s) Signed: 06/13/2019 5:42:52 PM By: Shawn Stall Entered By: Shawn Stall on 06/13/2019 14:12:15 -------------------------------------------------------------------------------- Pain Assessment Details Patient Name: Date of Service: Sarah, [name]. 06/13/2019 1:30 PM Medical Record Number: [id number] Patient Account Number: [id number] Date of Birth/Sex: Treating RN: Apr 29, 1955 (64 y.o. [name] Primary Care Caelyn Route: MA SO UDI, [place] MA LSA DA T Other  Clinician: Referring Charnette Younkin: Treating Rylei Masella/Extender: Cassandria Anger MA SO UDI, ELHA MA LSA DA T Weeks in Treatment: 26 Active Problems Location of Pain Severity and Description of Pain Patient Has Paino No Site Locations Pain Management and Medication Current Pain Management: Electronic Signature(s) Signed: 06/13/2019 5:42:52 PM By: Shawn Stall Signed: 06/20/2019 9:13:31 AM By: Karl Ito Entered By: Karl Ito on 06/13/2019 14:09:37 -------------------------------------------------------------------------------- Patient/Caregiver Education Details Patient Name: Date of Service: [name] 5/27/2021andnbsp1:30 PM Medical Record Number: [id number] Patient Account Number: [id number] Date of  Birth/Gender: Treating RN: 08/17/1955 (64 y.o. [name] Primary Care Physician: MA SO UDI, ELHA MA LSA DA T Other Clinician: Referring Physician: Treating Physician/Extender: Tobi Bastos MA SO UDI, ELHA MA LSA DA T Weeks in Treatment: 65 Education Assessment Education Provided To: Patient Education Topics Provided Wound/Skin Impairment: Handouts: Skin Care Do's and Dont's Methods: Explain/Verbal Responses: Reinforcements needed Electronic Signature(s) Signed: 06/13/2019 5:42:52 PM By: Deon Pilling Entered By: Deon Pilling on 06/13/2019 14:12:26 -------------------------------------------------------------------------------- Wound Assessment Details Patient Name: Date of Service: [facility], [name] 06/13/2019 1:30 PM Medical Record Number: [id number] Patient Account Number: [id number] Date of Birth/Sex: Treating RN: 1955-09-07 (64 y.o. [name] Primary Care Muna Demers: MA SO UDI, ELHA MA LSA DA T Other Clinician: Referring Abiha Lukehart: Treating Roosevelt Bisher/Extender: Tobi Bastos MA SO UDI, ELHA MA LSA DA T Weeks in Treatment: 19 Wound Status Wound Number: 10 Primary Etiology: Diabetic Wound/Ulcer of the Lower Extremity Wound Location:  Left, Lateral, Plantar Foot Wound Status: Open Wounding Event: Blister Date Acquired: 11/18/2018 Weeks Of Treatment: 19 Clustered Wound: No Wound Measurements Length: (cm) 1.2 Width: (cm) 1.6 Depth: (cm) 1.1 Area: (cm) 1.508 Volume: (cm) 1.659 % Reduction in Area: 14.3% % Reduction in Volume: 37.1% Wound Description Classification: Grade 2 Electronic Signature(s) Signed: 06/13/2019 5:42:52 PM By: Deon Pilling Signed: 06/20/2019 9:13:31 AM By: Sandre Kitty Entered By: Sandre Kitty on 06/13/2019 14:09:58 -------------------------------------------------------------------------------- Wound Assessment Details Patient Name: Date of Service: Sarah [place], Adalis J. 06/13/2019 1:30 PM Medical Record Number: [id number] Patient Account Number: [id number] Date of Birth/Sex: Treating RN: [birth year] (64 y.o. [name] Primary Care Shantale Holtmeyer: MA SO UDI, ELHA MA LSA DA T Other Clinician: Referring Kynisha Memon: Treating Shyloh Derosa/Extender: Tobi Bastos MA SO UDI, ELHA MA LSA DA T Weeks in Treatment: 19 Wound Status Wound Number: 9 Primary Etiology: Diabetic Wound/Ulcer of the Lower Extremity Wound Location: Right, Medial Foot Wound Status: Open Wounding Event: Blister Date Acquired: 06/18/2018 Weeks Of Treatment: 19 Clustered Wound: No Wound Measurements Length: (cm) 3 Width: (cm) 2.6 Depth: (cm) 0.2 Area: (cm) 6.126 Volume: (cm) 1.225 % Reduction in Area: 44.3% % Reduction in Volume: 87.6% Wound Description Classification: Grade 2 Electronic Signature(s) Signed: 06/13/2019 5:42:52 PM By: Deon Pilling Signed: 06/20/2019 9:13:31 AM By: Sandre Kitty Entered By: Sandre Kitty on 06/13/2019 14:09:58 -------------------------------------------------------------------------------- Vitals Details Patient Name: Date of Service: Sarah CH, Venesha J. 06/13/2019 1:30 PM Medical Record Number: [id number] Patient Account Number: [id number] Date of Birth/Sex: Treating  RN: [birth year] (64 y.o. Helene Shoe, Meta.Reding Primary Care Marzell Isakson: MA SO UDI, ELHA MA LSA DA T Other Clinician: Referring Nickalus Thornsberry: Treating Bernadett Milian/Extender: Tobi Bastos MA SO UDI, ELHA MA LSA DA T Weeks in Treatment: 19 Vital Signs Time Taken: 14:08 Temperature (F): 98.2 Height (in): 68 Pulse (bpm): 80 Weight (lbs): 265 Respiratory Rate (breaths/min): 18 Body Mass Index (BMI): 40.3 Blood Pressure (mmHg): 144/74 Capillary Blood Glucose (mg/dl): 134 Reference Range: 80 - 120 mg / dl Electronic Signature(s) Signed: 06/20/2019 9:13:31 AM By: Sandre Kitty Entered By: Sandre Kitty on 06/13/2019 14:08:42

## 2019-06-20 NOTE — Progress Notes (Signed)
RICCI, PAFF ([id number]) Visit Report for 06/10/2019 Arrival Information Details Patient Name: Date of Service: [name], [name] 06/10/2019 1:30 PM Medical Record Number: [id number] Patient Account Number: [id number] Date of Birth/Sex: Treating RN: 04-03-1955 (64 y.o. [name] Primary Care Khira Cudmore: MA SO UDI, ELHA MA LSA DA T Other Clinician: Referring Sabeen Piechocki: Treating [name]/Extender: Sarah Anger MA SO UDI, ELHA MA LSA DA T [name]: 19 Visit Information History Since Last Visit Added or deleted any medications: No Patient Arrived: Crutches Any new allergies or adverse reactions: No Arrival Time: 13:43 Had a fall or experienced change in No Accompanied By: self activities of daily living that may affect Transfer Assistance: None risk of falls: Patient Identification Verified: Yes Signs or symptoms of abuse/neglect since last visito No Secondary Verification Process Completed: Yes Hospitalized since last visit: No Patient Requires Transmission-Based Precautions: No Implantable device outside of the clinic excluding No Patient Has Alerts: No cellular tissue based products placed in the center since last visit: Has Dressing in Place as Prescribed: Yes Pain Present Now: No Electronic Signature(s) Signed: 06/20/2019 9:13:31 AM By: [name] Entered By: [name] on 06/10/2019 13:44:39 -------------------------------------------------------------------------------- Encounter Discharge Information Details Patient Name: Date of Service: [name], [name] 06/10/2019 1:30 PM Medical Record Number: [id number] Patient Account Number: [id number] Date of Birth/Sex: Treating RN: 1955-03-28 (64 y.o. [name] Primary Care Achsah Mcquade: MA SO UDI, [place] MA LSA DA T Other Clinician: Referring Orbie Grupe: Treating Elasia Furnish/Extender: Sarah Anger MA SO UDI, ELHA MA LSA DA T [name]: 81 Encounter Discharge Information Items Post  Procedure Vitals Discharge Condition: Stable Temperature (F): 98.2 Ambulatory Status: Crutches Pulse (bpm): 83 Discharge Destination: Home Respiratory Rate (breaths/min): 18 Transportation: Private Auto Blood Pressure (mmHg): 124/57 Accompanied By: self Schedule Follow-up Appointment: Yes Clinical Summary of Care: Electronic Signature(s) Signed: 06/10/2019 4:00:55 PM By: Shawn Stall Entered By: Shawn Stall on 06/10/2019 15:14:48 -------------------------------------------------------------------------------- Lower Extremity Assessment Details Patient Name: Date of Service: [name], BENSEN. 06/10/2019 1:30 PM Medical Record Number: [id number] Patient Account Number: [id number] Date of Birth/Sex: Treating RN: Sep 20, 1955 (64 y.o. [name] Primary Care Othello Dickenson: MA SO UDI, [address] MA LSA DA T Other Clinician: Referring Ghadeer Kastelic: Treating Latishia Suitt/Extender: Sarah Anger MA SO UDI, ELHA MA LSA DA T [name]: 19 Edema Assessment Assessed: [Left: No] [Right: No] Edema: [Left: Yes] [Right: Yes] Calf Left: Right: Point of Measurement: 43 cm From Medial Instep 36.5 cm 37 cm Ankle Left: Right: Point of Measurement: 12 cm From Medial Instep 24.5 cm 26.5 cm Electronic Signature(s) Signed: 06/11/2019 5:17:39 PM By: Yevonne Pax RN Entered By: Yevonne Pax on 06/10/2019 14:07:19 -------------------------------------------------------------------------------- Multi-Disciplinary Care Plan Details Patient Name: Date of Service: [name], [name] 06/10/2019 1:30 PM Medical Record Number: [id number] Patient Account Number: [id number] Date of Birth/Sex: Treating RN: 06-Jun-1955 (64 y.o. [name] Primary Care Anothy Bufano: MA SO UDI, Judene Companion MA LSA DA T Other Clinician: Referring Demareon Coldwell: Treating Laria Grimmett/Extender: Sarah Anger MA SO UDI, ELHA MA LSA DA T [name]: 85 Active Inactive Wound/Skin Impairment Nursing Diagnoses: Impaired tissue  integrity Knowledge deficit related to ulceration/compromised skin integrity Goals: Patient/caregiver will verbalize understanding of skin care regimen Date Initiated: 01/28/2019 Target Resolution Date: 07/12/2019 Goal Status: Active Interventions: Assess patient/caregiver ability to obtain necessary supplies Assess patient/caregiver ability to perform ulcer/skin care regimen upon admission and as needed Assess ulceration(s) every visit Provide education on ulcer and skin care Notes: Electronic Signature(s) Signed: 06/10/2019 5:12:22 PM By: Zandra Abts RN,  BSN Entered By: Levan Hurst on 06/10/2019 14:28:54 -------------------------------------------------------------------------------- Pain Assessment Details Patient Name: Date of Service: [name], [name] 06/10/2019 1:30 PM Medical Record Number: [id number] Patient Account Number: [id number] Date of Birth/Sex: Treating RN: Jun 08, 1955 (64 y.o. [name] Primary Care Reyce Lubeck: MA SO UDI, Willette Pa MA LSA DA T Other Clinician: Referring Zynia Wojtowicz: Treating Daneya Hartgrove/Extender: Tobi Bastos MA SO UDI, ELHA MA LSA DA T [name]: 7 Active Problems Location of Pain Severity and Description of Pain Patient Has Paino No Site Locations Pain Management and Medication Current Pain Management: Electronic Signature(s) Signed: 06/10/2019 5:12:22 PM By: Levan Hurst RN, BSN Signed: 06/20/2019 9:13:31 AM By: Sandre Kitty Entered By: Sandre Kitty on 06/10/2019 13:45:38 -------------------------------------------------------------------------------- Patient/Caregiver Education Details Patient Name: Date of Service: [name] 5/24/2021andnbsp1:30 PM Medical Record Number: [id number] Patient Account Number: [id number] Date of Birth/Gender: Treating RN: [birth year] (64 y.o. [name] Primary Care Physician: MA SO UDI, ELHA MA LSA DA T Other Clinician: Referring Physician: Treating  Physician/Extender: Tobi Bastos MA SO UDI, ELHA MA LSA DA T [name]: 60 Education Assessment Education Provided To: Patient Education Topics Provided Wound/Skin Impairment: Methods: Explain/Verbal Responses: State content correctly [employer]) Signed: 06/10/2019 5:12:22 PM By: Levan Hurst RN, BSN Entered By: Levan Hurst on 06/10/2019 14:29:09 -------------------------------------------------------------------------------- Wound Assessment Details Patient Name: Date of Service: [name], [name] 06/10/2019 1:30 PM Medical Record Number: [id number] Patient Account Number: [id number] Date of Birth/Sex: Treating RN: 01-Dec-1955 (64 y.o. [name] Primary Care Kiyana Vazguez: MA SO UDI, ELHA MA LSA DA T Other Clinician: Referring Marjie Chea: Treating Maripat Borba/Extender: Tobi Bastos MA SO UDI, ELHA MA LSA DA T [name]: 19 Wound Status Wound Number: [id number] Primary Etiology: Diabetic Wound/Ulcer of the Lower Extremity Wound Location: Left, Lateral, Plantar Foot Wound Status: Open Wounding Event: Blister Date Acquired: 11/18/2018 Sarah Of [name]: 19 Clustered Wound: No Photos Photo Uploaded By: Mikeal Hawthorne on 06/13/2019 14:16:32 Wound Measurements Length: (cm) 1.2 Width: (cm) 1.6 Depth: (cm) 1.1 Area: (cm) 1.508 Volume: (cm) 1.659 % Reduction in Area: 14.3% % Reduction in Volume: 37.1% Wound Description Classification: Grade 2 Electronic Signature(s) Signed: 06/10/2019 5:12:22 PM By: Levan Hurst RN, BSN Signed: 06/20/2019 9:13:31 AM By: Sandre Kitty Entered By: Sandre Kitty on 06/10/2019 14:02:50 -------------------------------------------------------------------------------- Wound Assessment Details Patient Name: Date of Service: [name], [name] 06/10/2019 1:30 PM Medical Record Number: [id number] Patient Account Number: [id number] Date of Birth/Sex: Treating RN: Feb 13, 1955 (64 y.o. [name] Primary Care Shyvonne Chastang: MA SO UDI, ELHA MA LSA DA T Other Clinician: Referring Corda Shutt: Treating Niema Carrara/Extender: Tobi Bastos MA SO UDI, ELHA MA LSA DA T [name]: 19 Wound Status Wound Number: [id number] Primary Etiology: Diabetic Wound/Ulcer of the Lower Extremity Wound Location: Right, Medial Foot Wound Status: Open Wounding Event: Blister Date Acquired: 06/18/2018 Sarah Of [name]: 19 Clustered Wound: No Photos Photo Uploaded By: Mikeal Hawthorne on 06/13/2019 14:16:32 Wound Measurements Length: (cm) 3 Width: (cm) 2.6 Depth: (cm) 0.2 Area: (cm) 6.126 Volume: (cm) 1.225 % Reduction in Area: 44.3% % Reduction in Volume: 87.6% Wound Description Classification: Grade 2 Electronic Signature(s) Signed: 06/10/2019 5:12:22 PM By: Levan Hurst RN, BSN Signed: 06/20/2019 9:13:31 AM By: Sandre Kitty Entered By: Sandre Kitty on 06/10/2019 14:02:50 -------------------------------------------------------------------------------- Vitals Details Patient Name: Date of Service: [name], [name] 06/10/2019 1:30 PM Medical Record Number: [id number] Patient Account Number: [id number] Date of Birth/Sex: Treating RN: 1955/09/09 (64 y.o. [name] Primary Care Gracynn Rajewski: MA SO UDI, [name] MA LSA  DA T Other Clinician: Referring Aryani Daffern: Treating Reilly Molchan/Extender: Sarah Anger MA SO UDI, ELHA MA LSA DA T [name]: 19 Vital Signs Time Taken: 13:44 Temperature (F): 98.2 Height (in): 68 Pulse (bpm): 83 Weight (lbs): 265 Respiratory Rate (breaths/min): 18 Body Mass Index (BMI): 40.3 Blood Pressure (mmHg): 124/57 Capillary Blood Glucose (mg/dl): 876 Reference Range: 80 - 120 mg / dl Electronic Signature(s) Signed: 06/20/2019 9:13:31 AM By: [name] Entered By: [name] on 06/10/2019 13:45:29

## 2019-06-23 ENCOUNTER — Other Ambulatory Visit: Payer: Self-pay | Admitting: Internal Medicine

## 2019-06-23 DIAGNOSIS — E1169 Type 2 diabetes mellitus with other specified complication: Secondary | ICD-10-CM

## 2019-06-24 ENCOUNTER — Other Ambulatory Visit: Payer: Self-pay

## 2019-06-24 ENCOUNTER — Encounter (HOSPITAL_BASED_OUTPATIENT_CLINIC_OR_DEPARTMENT_OTHER): Payer: Medicare Other | Admitting: Internal Medicine

## 2019-06-24 DIAGNOSIS — E11621 Type 2 diabetes mellitus with foot ulcer: Secondary | ICD-10-CM | POA: Diagnosis not present

## 2019-06-24 NOTE — Progress Notes (Signed)
NILANI, HUGILL ([id number]) Visit Report for 06/24/2019 HPI Details Patient Name: Date of Service: [name], [name] 06/24/2019 3:15 PM Medical Record Number: [id number] Patient Account Number: [id number] Date of Birth/Sex: Treating RN: [birth year] (64 y.o. [name] Primary Care Provider: MA SO UDI, [place] MA LSA DA T Other Clinician: Referring Provider: Treating Provider/Extender: Baltazar Najjar MA SO UDI, ELHA MA LSA DA T Weeks in Treatment: 21 History of Present Illness HPI Description: 64 yrs old bf here for follow up recurrent left charcot foot ulcer. She has DM. She is not a smoker. She states a blister developed 3 months ago at site of previous breakdown from 1 year ago. It was debrided at the podiatrist's office. She went to the ER and then sent here. She denies pain. Xray was negative for osteo. Culture from this clinic negative. 09/08/[name] to [name] for Charcot foot. Seen by him and MRI scheduled 09/19/14. Prior silver collagen but this was not ordered last visit, patient has been rinsing with saline alone. Re institute silver collagen every other day. F/u 2 weeks with [name] as Labor Day holiday. Supplies ordered. 09/25/14; this is a patient with a Charcot foot on the left. she has a wound over the plantar aspect of her left calcaneus. She has been seen by [name] of orthopedic surgery and has had an MRI. She is apparently going within the next week or 2 for corrective surgery which will involve an external fixator. I don't have any of the details of this. 10/06/14; The patient is a 64 yrs old bf with a left Charcot foot and ulcer on the plantar. 12/01/14 Returns post surgery with [name]. Has follow up visit later this week with him, currently in facility and NWB over this extremity. States no drainage from foot and no current wound care. On exam callus and scab in place, suspect healed. Has external fixator in place. Will defer to [name]  for management, ok to place moisturizing cream over scabs and callus and she will call if she requires follow up here. READMISSION 01/28/2019 Patient was in the clinic in 2016 for a wound on the plantar aspect of her left calcaneus. Predominantly cared for by [name] she was referred to [name] and had corrective surgery for the position of her left ankle I believe. She had previously been here in 2015 and then prior to that in 2011 2012 that I do not have these records. More recently she has developed fairly extensive wounds on the right medial foot, left lateral foot and a small area on the right medial great toe. Right medial foot wound has been there for 6 to 7 months, left foot wound for 2 to 3 months and the right great toe only over the last few weeks. She has been followed by [name] at Dr. Laverta [place] office it sounds as though she has been undergoing callus paring and silver nitrate. The patient has been washing these off with soap and water and plan applying dry dressing Past medical history, type 2 diabetes well controlled with a recent hemoglobin A1c of 7 on 11/16, type 2 diabetes with peripheral neuropathy, previous left Charcot joint surgery bilateral Charcot joints currently, stage III chronic renal failure, hypertension, obstructive sleep apnea, history of MRSA and gastroesophageal reflux. 1/18; this is a patient with bilateral Charcot feet. Plain x-rays that I did of both of these wounds that are either at bone or precariously close to bone  were done. On the right foot this showed possible lytic destruction involving the anterior aspect of the talus which may represent a wound osteomyelitis. An MRI was recommended. On the left there was no definite lytic destruction although the wound is deep undermining's and I think probably requires an MRI on this basis in any case 1/25; patient was supposed to have her MRI last Friday of her bilateral feet however they would not do  it because there was silver alginate in her dressings, will replace with calcium alginate today and will see if we can get her done today 2/1; patient did not get her MRIs done last week because of issues with the MRI department receiving orders to do bilateral feet. She has 2 deep wounds in the setting of Charcot feet deformities bilaterally. Both of them at one point had exposed bone although I had trouble demonstrating that today. I am not sure that we can offload these very aggressively as I watched her leave the clinic last week her gait is very narrow and unsteady. 2/15; the MRI of her right foot did not show osteomyelitis potentially osteonecrosis of the talus. 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 The wounds are a large wound on the right medial foot and on the left a substantial wound on the left lateral plantar calcaneus. We have been using silver alginate. Ultimately I think we need to consider a total contact cast on the right while we work through the possibility of osteomyelitis in the left. I watched her walk out with her crutches I have some trepidation about the ability to walk with the cast on her right foot. Nevertheless with her Charcot deformities I do not think there is a way to heal these short of offloading them aggressively. 2/22; the culture of the bone in the left foot that I did last week showed a few Citrobacter Koseri as well as some streptococcal species. In my attempted bone for pathology did not actually yield a lot of bone the pathologist did not identify any osteomyelitis. 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 Is promised today and aggressive debridement of the right foot and a total contact cast which surprisingly she seemed to tolerate quite well 03/13/2019 upon  evaluation today patient appears to be doing well with her total contact cast she is here for the first cast change. She had no areas of rubbing or any other complications at this point. Overall things are doing quite well. 2/26; the patient was kindly seen by Dr. Megan Salon of infectious disease on 2/25. She is now on IV vancomycin PICC line placement in 2 days and oral Flagyl. This was in response to her MRI showing osteomyelitis of the left calcaneus concerning for osteomyelitis. We have been putting her right leg in a total contact cast. Our nurses report drainage. She is tolerating the total contact cast well. 3/4; the patient is started her IV antibiotics and is taking IV vancomycin and oral Flagyl. She appears to be tolerating these both well. This is for osteomyelitis involving the left calcaneus. The area on the right in the setting of bilateral Charcot deformities did not have any bone infection on MRI. We put a total contact cast on her with silver alginate as the primary dressing and she returns today in follow-up. Per  our intake nurse there was too much drainage to leave this on all week therefore we will bring her back for an early change 3/8; follow-up total contact cast she is still having a lot of drainage probably too much drainage from the right foot wound will week with the same cast. She will be back on Thursday. The area on the left looks somewhat better 3/11; patient here for a total contact cast change. 3/15; patient came in for wound care evaluation. She is still on IV vancomycin and oral Flagyl for osteomyelitis in the left foot. The area on the right foot we are putting in a total contact cast. 3/18 for total contact cast change on the right foot 3/22; the patient was here for wound review today. The area on the right foot is slightly smaller in terms of surface area. However the surface of the wound is very gritty and hyper granulated. More problematically the area on the  plantar left foot has increased depth indeed in the [name] of this it probes to bone. We have been using Hydrofera Blue in both areas. She is on antibiotics as prescribed by infectious disease IV vancomycin and oral Flagyl 3/25; the patient is in for total contact cast change. Our intake nurse was concerned about the amount of drainage which soaked right to the multiple layers we are putting on this. Wondered about options. The wound bed actually looks as healthy as this is looked although there may not be a lot of change in dimensions things are looking a lot better. If we are going to heal this woman's wound which is in the middle of her right Charcot foot this is going to need to be offloaded. There are not many alternatives 3/29; still not a lot of improvement although the surface of the right wound looks better. We have been using Hydrofera Blue 04/17/2019 upon evaluation today patient appears to be doing well with a total contact cast. With actually having to change this twice a week. She saw Dr. Dellia Nims for wound care earlier in the week this is for the cast change today. That is due to the fact that she has significant drainage. Overall the wound is been looking excellent however. 4/5; we continue to put a total contact cast on this patient with Hydrofera Blue. Still a lot of drainage which precludes a simple weekly change or changing this twice a week. 4/8; total contact cast changed today. Apparently the wound looks better per our intake nurse 4/12; patient here for wound care evaluation. Wounds on the right foot have not changed in dimension none about a month left foot looks similar. Apparently her antibiotics that we are giving her for osteomyelitis in the left foot complete on Wednesday. We have been using Hydrofera Blue on the right foot under a total contact cast. Nurses report greenish drainage although I think this may be discoloration from the [name] 4/15; back for cast  change wounds look quite good although still moderate amount of drainage. T contact cast reapplied otal 4/19;. Wound evaluation. Wound on the right foot is smaller surface looks healthy. There is still the divot in the middle part of this wound but I could not feel any bone. Area on the left foot also looks better She has completed her antibiotics but still has the PICC line in. 4/23; patient comes back for a total contact cast change this was done in the standard fashion no issues 4/26; wound measures slightly larger on the right  foot which is disappointing. She sees Dr. Orvan Falconer with regards to her antibiotics on Wednesday. I believe she is on vancomycin IV and oral Flagyl. 4/29; in for her routine total contact cast change. She saw Dr. Orvan Falconer this morning. He is asking about antibiotic continuation I will be in touch with him. I did not look at her wound today 5/3; patient saw Dr. Orvan Falconer on 4/28. I did send him a note asking about the continuation of perhaps an oral regimen. I have not heard back. She still has an elevated sedimentation rate at 81. Her wounds are smaller. 5/6; in for her total contact cast change. We did not look at the wound today. She has seen Dr. Orvan Falconer and is now on oral Keflex and Flagyl. She seems to be tolerating these well 5/10; she had her PICC line removed. The wound on the right foot after some initial improvement has not made any improvement even with the total contact cast. Still having a lot of drainage. Likewise the area on the left foot really is not any better either 5/14; in for a cast change today. We will look at the right foot on Monday. If this is not making any progress I am going to try to cast the left foot. 5/17; the wound on the right foot is absolutely no different. I am going to put ointment on this area and change the cast of the left foot. We have been using silver alginate there 5/21; apparently the area on the right foot was better per our  intake nurse although I did not see it. We did a standard total contact cast change on the left foot we have been applying silver alginate to the wound here. 06/10/19-Area on the right foot per dimensions slightly better, the left foot has still some depth applying silver alginate to the wound with TCC 06/13/19-Patient here for Casting to Left foot 6/1; patient had the total contact cast were placed on the left foot. Noted some swelling in her right leg although the dimensions of the wound on the right foot are better. We have been using PolyMem on the right and silver alginate on the left under the cast 6/7; we continue to make nice progress in both her plantar feet wound. We are putting the total contact cast on the left. Surprisingly in spite of this the area on the right medial foot in the middle of her Charcot deformities actually is making progress as well Electronic Signature(s) Signed: 06/24/2019 8:16:21 PM By: Baltazar Najjar MD Entered By: Baltazar Najjar on 06/24/2019 18:09:34 -------------------------------------------------------------------------------- Physical Exam Details Patient Name: Date of Service: [name], [name] 06/24/2019 3:15 PM Medical Record Number: [id number] Patient Account Number: [id number] Date of Birth/Sex: Treating RN: 08-15-1955 (64 y.o. [name] Primary Care Provider: MA SO UDI, [place] MA LSA DA T Other Clinician: Referring Provider: Treating Provider/Extender: Baltazar Najjar MA SO UDI, ELHA MA LSA DA T Weeks in Treatment: 21 Notes Wound exam; left plantar wound is now much more superficial. We are using silver alginate here with a total contact cast The right foot wound is also come in nicely there is no deep structures palpable Surface area is better in both wound areas Electronic Signature(s) Signed: 06/24/2019 8:16:21 PM By: Baltazar Najjar MD Signed: 06/24/2019 8:16:21 PM By: Baltazar Najjar MD Entered By: Baltazar Najjar on 06/24/2019  18:10:16 -------------------------------------------------------------------------------- Physician Orders Details Patient Name: Date of Service: [name], [name] 06/24/2019 3:15 PM Medical Record Number: [id number] Patient Account Number:  [id number] Date of Birth/Sex: Treating RN: 04-27-1955 (64 y.o. [name] Primary Care Provider: MA SO UDI, [place] MA LSA DA T Other Clinician: Referring Provider: Treating Provider/Extender: Linton Ham MA SO UDI, ELHA MA LSA DA T Weeks in Treatment: 108 Verbal / Phone Orders: No Diagnosis Coding ICD-10 Coding Code Description E11.621 Type 2 diabetes mellitus with foot ulcer L97.524 Non-pressure chronic ulcer of other part of left foot with necrosis of bone L97.511 Non-pressure chronic ulcer of other part of right foot limited to breakdown of skin L97.514 Non-pressure chronic ulcer of other part of right foot with necrosis of bone E11.42 Type 2 diabetes mellitus with diabetic polyneuropathy M14.671 Charcot's joint, right ankle and foot M14.672 Charcot's joint, left ankle and foot 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 Dressing Change Frequency Wound #10 Left,Lateral,Plantar Foot Do not change entire dressing for one week. Wound #9 Right,Medial Foot Do not change entire dressing for one week. Wound Cleansing May shower with protection. Primary Wound Dressing Wound #10 Left,Lateral,Plantar Foot Calcium Alginate with Silver Wound #9 Right,Medial Foot Polymem Silver Secondary Dressing Wound #10 Left,Lateral,Plantar Foot Foam Kerlix/Rolled Gauze Dry Gauze Drawtex Wound #9 Right,Medial Foot ABD pad Zetuvit or Kerramax Edema Control 3 Layer Compression System - Right Lower Extremity Elevate legs to the level of the heart or above for 30 minutes daily and/or when sitting, a frequency of: - throughout the day Exercise regularly Off-Loading Total Contact  Cast to Left Lower Extremity Open toe surgical shoe to: - right foot Electronic Signature(s) Signed: 06/24/2019 6:28:13 PM By: Levan Hurst RN, BSN Signed: 06/24/2019 8:16:21 PM By: Linton Ham MD Entered By: Levan Hurst on 06/24/2019 16:43:30 -------------------------------------------------------------------------------- Problem List Details Patient Name: Date of Service: [name], [name] 06/24/2019 3:15 PM Medical Record Number: [id number] Patient Account Number: [id number] Date of Birth/Sex: Treating RN: [birth year] (64 y.o. [name] Primary Care Provider: MA SO UDI, [place] MA LSA DA T Other Clinician: Referring Provider: Treating Provider/Extender: Marlynn Perking, ELHA MA LSA DA T Weeks in Treatment: 21 Active Problems ICD-10 Encounter Code Description Active Date MDM Diagnosis E11.621 Type 2 diabetes mellitus with foot ulcer 01/28/2019 No Yes L97.524 Non-pressure chronic ulcer of other part of left foot with necrosis of bone 01/28/2019 No Yes L97.511 Non-pressure chronic ulcer of other part of right foot limited to breakdown of 01/28/2019 No Yes skin L97.514 Non-pressure chronic ulcer of other part of right foot with necrosis of bone 01/28/2019 No Yes E11.42 Type 2 diabetes mellitus with diabetic polyneuropathy 01/28/2019 No Yes M14.671 Charcot's joint, right ankle and foot 01/28/2019 No Yes M14.672 Charcot's joint, left ankle and foot 01/28/2019 No Yes 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 06/24/2019 3:15 PM Medical Record Number: [id number] Patient Account Number: [id number] Date of Birth/Sex: Treating  RN: 12-02-1955 (64 y.o. [name] Primary Care Provider: MA SO UDI, [place] MA LSA DA T Other Clinician: Referring Provider: Treating Provider/Extender: Linton Ham MA SO UDI, [place] MA LSA DA T Weeks in Treatment: 21 Subjective History of Present Illness (HPI) 64 yrs old bf here for follow up recurrent left charcot foot ulcer. She has DM. She is not a smoker. She states a blister developed 3 months ago at site of previous  breakdown from 1 year ago. It was debrided at the podiatrist's office. She went to the ER and then sent here. She denies pain. Xray was negative for osteo. Culture from this clinic negative. 09/08/14 Referred by [name] to [name] for Charcot foot. Seen by him and MRI scheduled 09/19/14. Prior silver collagen but this was not ordered last visit, patient has been rinsing with saline alone. Re institute silver collagen every other day. F/u 2 weeks with [name] as Labor Day holiday. Supplies ordered. 09/25/14; this is a patient with a Charcot foot on the left. she has a wound over the plantar aspect of her left calcaneus. She has been seen by [name] of orthopedic surgery and has had an MRI. She is apparently going within the next week or 2 for corrective surgery which will involve an external fixator. I don't have any of the details of this. 10/06/14; The patient is a 64 yrs old bf with a left Charcot foot and ulcer on the plantar. 12/01/14 Returns post surgery with [name]. Has follow up visit later this week with him, currently in facility and NWB over this extremity. States no drainage from foot and no current wound care. On exam callus and scab in place, suspect healed. Has external fixator in place. Will defer to [name] for management, ok to place moisturizing cream over scabs and callus and she will call if she requires follow up here. READMISSION 01/28/2019 Patient was in the clinic in 2016 for a wound on the plantar aspect of her left calcaneus.  Predominantly cared for by [name] she was referred to [name] and had corrective surgery for the position of her left ankle I believe. She had previously been here in 2015 and then prior to that in 2011 2012 that I do not have these records. More recently she has developed fairly extensive wounds on the right medial foot, left lateral foot and a small area on the right medial great toe. Right medial foot wound has been there for 6 to 7 months, left foot wound for 2 to 3 months and the right great toe only over the last few weeks. She has been followed by [name] at Dr. Laverta [place] office it sounds as though she has been undergoing callus paring and silver nitrate. The patient has been washing these off with soap and water and plan applying dry dressing Past medical history, type 2 diabetes well controlled with a recent hemoglobin A1c of 7 on 11/16, type 2 diabetes with peripheral neuropathy, previous left Charcot joint surgery bilateral Charcot joints currently, stage III chronic renal failure, hypertension, obstructive sleep apnea, history of MRSA and gastroesophageal reflux. 1/18; this is a patient with bilateral Charcot feet. Plain x-rays that I did of both of these wounds that are either at bone or precariously close to bone were done. On the right foot this showed possible lytic destruction involving the anterior aspect of the talus which may represent a wound osteomyelitis. An MRI was recommended. On the left there was no definite lytic destruction although the wound is deep undermining's and I think probably requires an MRI on this basis in any case 1/25; patient was supposed to have her MRI last Friday of her bilateral feet however they would not do it because there was silver alginate in her dressings, will replace with calcium alginate today and will see if we can get her done today 2/1; patient did not get her MRIs done last week because  of issues with the MRI department  receiving orders to do bilateral feet. She has 2 deep wounds in the setting of Charcot feet deformities bilaterally. Both of them at one point had exposed bone although I had trouble demonstrating that today. I am not sure that we can offload these very aggressively as I watched her leave the clinic last week her gait is very narrow and unsteady. 2/15; the MRI of her right foot did not show osteomyelitis potentially osteonecrosis of the talus. 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 The wounds are a large wound on the right medial foot and on the left a substantial wound on the left lateral plantar calcaneus. We have been using silver alginate. Ultimately I think we need to consider a total contact cast on the right while we work through the possibility of osteomyelitis in the left. I watched her walk out with her crutches I have some trepidation about the ability to walk with the cast on her right foot. Nevertheless with her Charcot deformities I do not think there is a way to heal these short of offloading them aggressively. 2/22; the culture of the bone in the left foot that I did last week showed a few Citrobacter Koseri as well as some streptococcal species. In my attempted bone for pathology did not actually yield a lot of bone the pathologist did not identify any osteomyelitis. 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 Is promised today and aggressive debridement of the right foot and a total contact cast which surprisingly she seemed to tolerate quite well 03/13/2019 upon evaluation today patient appears to be doing well with her total contact cast she is here for the first cast change. She had no areas of rubbing or any other complications at this point. Overall things are doing quite well. 2/26;  the patient was kindly seen by Dr. Orvan Falconer of infectious disease on 2/25. She is now on IV vancomycin PICC line placement in 2 days and oral Flagyl. This was in response to her MRI showing osteomyelitis of the left calcaneus concerning for osteomyelitis. We have been putting her right leg in a total contact cast. Our nurses report drainage. She is tolerating the total contact cast well. 3/4; the patient is started her IV antibiotics and is taking IV vancomycin and oral Flagyl. She appears to be tolerating these both well. This is for osteomyelitis involving the left calcaneus. The area on the right in the setting of bilateral Charcot deformities did not have any bone infection on MRI. We put a total contact cast on her with silver alginate as the primary dressing and she returns today in follow-up. Per our intake nurse there was too much drainage to leave this on all week therefore we will bring her back for an early change 3/8; follow-up total contact cast she is still having a lot of drainage probably too much drainage from the right foot wound will week with the same cast. She will be back on Thursday. The area on the left looks somewhat better 3/11; patient here for a total contact cast change. 3/15; patient came in for wound care evaluation. She is still on IV vancomycin and oral Flagyl for osteomyelitis in the left foot. The area on the right foot we are putting in a total contact  cast. 3/18 for total contact cast change on the right foot 3/22; the patient was here for wound review today. The area on the right foot is slightly smaller in terms of surface area. However the surface of the wound is very gritty and hyper granulated. More problematically the area on the plantar left foot has increased depth indeed in the [name] of this it probes to bone. We have been using Hydrofera Blue in both areas. She is on antibiotics as prescribed by infectious disease IV vancomycin and oral Flagyl 3/25;  the patient is in for total contact cast change. Our intake nurse was concerned about the amount of drainage which soaked right to the multiple layers we are putting on this. Wondered about options. The wound bed actually looks as healthy as this is looked although there may not be a lot of change in dimensions things are looking a lot better. If we are going to heal this woman's wound which is in the middle of her right Charcot foot this is going to need to be offloaded. There are not many alternatives 3/29; still not a lot of improvement although the surface of the right wound looks better. We have been using Hydrofera Blue 04/17/2019 upon evaluation today patient appears to be doing well with a total contact cast. With actually having to change this twice a week. She saw [name] for wound care earlier in the week this is for the cast change today. That is due to the fact that she has significant drainage. Overall the wound is been looking excellent however. 4/5; we continue to put a total contact cast on this patient with Hydrofera Blue. Still a lot of drainage which precludes a simple weekly change or changing this twice a week. 4/8; total contact cast changed today. Apparently the wound looks better per our intake nurse 4/12; patient here for wound care evaluation. Wounds on the right foot have not changed in dimension none about a month left foot looks similar. Apparently her antibiotics that we are giving her for osteomyelitis in the left foot complete on Wednesday. We have been using Hydrofera Blue on the right foot under a total contact cast. Nurses report greenish drainage although I think this may be discoloration from the [facility] 4/15; back for cast change wounds look quite good although still moderate amount of drainage. T contact cast reapplied otal 4/19;. Wound evaluation. Wound on the right foot is smaller surface looks healthy. There is still the divot in the middle part of  this wound but I could not feel any bone. Area on the left foot also looks better She has completed her antibiotics but still has the PICC line in. 4/23; patient comes back for a total contact cast change this was done in the standard fashion no issues 4/26; wound measures slightly larger on the right foot which is disappointing. She sees Dr. Orvan Falconer with regards to her antibiotics on Wednesday. I believe she is on vancomycin IV and oral Flagyl. 4/29; in for her routine total contact cast change. She saw Dr. Orvan Falconer this morning. He is asking about antibiotic continuation I will be in touch with him. I did not look at her wound today 5/3; patient saw Dr. Orvan Falconer on 4/28. I did send him a note asking about the continuation of perhaps an oral regimen. I have not heard back. She still has an elevated sedimentation rate at 81. Her wounds are smaller. 5/6; in for her total contact cast change. We did  not look at the wound today. She has seen Dr. Orvan Falconer and is now on oral Keflex and Flagyl. She seems to be tolerating these well 5/10; she had her PICC line removed. The wound on the right foot after some initial improvement has not made any improvement even with the total contact cast. Still having a lot of drainage. Likewise the area on the left foot really is not any better either 5/14; in for a cast change today. We will look at the right foot on Monday. If this is not making any progress I am going to try to cast the left foot. 5/17; the wound on the right foot is absolutely no different. I am going to put ointment on this area and change the cast of the left foot. We have been using silver alginate there 5/21; apparently the area on the right foot was better per our intake nurse although I did not see it. We did a standard total contact cast change on the left foot we have been applying silver alginate to the wound here. 06/10/19-Area on the right foot per dimensions slightly better, the left  foot has still some depth applying silver alginate to the wound with TCC 06/13/19-Patient here for Casting to Left foot 6/1; patient had the total contact cast were placed on the left foot. Noted some swelling in her right leg although the dimensions of the wound on the right foot are better. We have been using PolyMem on the right and silver alginate on the left under the cast 6/7; we continue to make nice progress in both her plantar feet wound. We are putting the total contact cast on the left. Surprisingly in spite of this the area on the right medial foot in the middle of her Charcot deformities actually is making progress as well Objective Constitutional Vitals Time Taken: 4:06 PM, Height: 68 in, Weight: 265 lbs, BMI: 40.3, Temperature: 98.6 F, Pulse: 76 bpm, Respiratory Rate: 18 breaths/min, Blood Pressure: 123/80 mmHg, Capillary Blood Glucose: 130 mg/dl. Integumentary (Hair, Skin) Wound #10 status is Open. Original cause of wound was Blister. The wound is located on the Left,Lateral,Plantar Foot. The wound measures 0.7cm length x 0.8cm width x 0.3cm depth; 0.44cm^2 area and 0.132cm^3 volume. There is Fat Layer (Subcutaneous Tissue) Exposed exposed. There is no tunneling or undermining noted. There is a medium amount of serosanguineous drainage noted. The wound margin is well defined and not attached to the wound base. There is large (67-100%) red granulation within the wound bed. There is a small (1-33%) amount of necrotic tissue within the wound bed including Adherent Slough. Wound #9 status is Open. Original cause of wound was Blister. The wound is located on the Right,Medial Foot. The wound measures 2.7cm length x 1.4cm width x 0.3cm depth; 2.969cm^2 area and 0.891cm^3 volume. There is Fat Layer (Subcutaneous Tissue) Exposed exposed. There is no tunneling or undermining noted. There is a medium amount of serosanguineous drainage noted. The wound margin is well defined and not attached to  the wound base. There is large (67- 100%) red, pink granulation within the wound bed. There is no necrotic tissue within the wound bed. 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 There was a Three Layer Compression Therapy Procedure by Zandra Abts, RN. Post procedure Diagnosis Wound #9: Same as Pre-Procedure Wound #10 Pre-procedure diagnosis of Wound #10 is a Diabetic Wound/Ulcer of the Lower Extremity located on the Left,Lateral,Plantar Foot . There was a T Contact otal Cast Procedure by [name], MD. Post procedure Diagnosis Wound #10: Same as Pre-Procedure Plan Follow-up Appointments: Return Appointment in 1 week. Dressing Change Frequency: Wound #10 Left,Lateral,Plantar Foot: Do not change entire dressing for one week. Wound #9 Right,Medial Foot: Do not change entire dressing for one week. Wound Cleansing: May shower with protection. Primary Wound Dressing: Wound #10 Left,Lateral,Plantar Foot: Calcium Alginate with Silver Wound #9 Right,Medial Foot: Polymem Silver Secondary Dressing: Wound #10 Left,Lateral,Plantar Foot: Foam Kerlix/Rolled Gauze Dry Gauze Drawtex Wound #9 Right,Medial Foot: ABD pad Zetuvit or Kerramax Edema Control: 3 Layer Compression System - Right Lower Extremity Elevate legs to the level of the heart or above for 30 minutes daily and/or when sitting, a  frequency of: - throughout the day Exercise regularly Off-Loading: T Contact Cast to Left Lower Extremity otal Open toe surgical shoe to: - right foot 1. I did not change the primary dressing which is polymen to the right medial foot and silver alginate under a total contact cast on the left foot 2. T contact cast replaced otal Electronic Signature(s) Signed: 06/24/2019 8:16:21 PM By: Baltazar Najjar MD Entered By: Baltazar Najjar on 06/24/2019 18:11:07 -------------------------------------------------------------------------------- Total Contact Cast Details Patient Name: Date of Service: [name], [name] 06/24/2019 3:15 PM Medical Record Number: [id number] Patient Account Number: [id number] Date of Birth/Sex: Treating RN: 1955/06/17 (64 y.o. [name] Primary Care Provider: MA SO UDI, Judene Companion MA LSA DA T Other Clinician: Referring Provider: Treating Provider/Extender: Baltazar Najjar MA SO UDI, ELHA MA LSA DA T Weeks in Treatment: 21 T Contact Cast Applied for Wound Assessment: otal Wound #10 Left,Lateral,Plantar Foot Performed By: Physician [name], MD Post Procedure Diagnosis Same as Pre-procedure Electronic Signature(s) Signed: 06/24/2019 8:16:21 PM By: Baltazar Najjar MD Entered By: Baltazar Najjar on 06/24/2019 18:08:54 -------------------------------------------------------------------------------- SuperBill Details Patient Name: Date of Service: [name], [name] 06/24/2019 Medical Record Number: [id number] Patient Account Number: [id number] Date of Birth/Sex: Treating RN: 1955/07/31 (64 y.o. [name] Primary Care Provider: MA SO UDI, [place] MA LSA DA T Other Clinician: Referring Provider: Treating Provider/Extender: Abe People, ELHA MA LSA DA T Weeks in Treatment: 21 Diagnosis Coding ICD-10 Codes Code Description E11.621 Type 2 diabetes mellitus with foot ulcer L97.524 Non-pressure chronic ulcer of other part of left foot  with necrosis of bone L97.511 Non-pressure chronic ulcer of other part of right foot limited to breakdown of skin L97.514 Non-pressure chronic ulcer of other part of right foot with necrosis of bone E11.42 Type 2 diabetes mellitus with diabetic polyneuropathy M14.671 Charcot's joint, right ankle and foot M14.672 Charcot's joint, left ankle and foot 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, left ankle and foot Facility Procedures CPT4 Code: [id number] Description: [phone] - APPLY TOTAL CONTACT LEG CAST ICD-10 Diagnosis Description L97.524 Non-pressure chronic ulcer of other part of left foot with necrosis of bone Modifier: Quantity: 1 CPT4 Code: [id number] Description: (Facility Use Only) [id number] - APPLY MULTLAY COMPRS LWR RT LEG Modifier: 59 Quantity: 1 Physician Procedures :  CPT4 Code Description Modifier [id number] - WC PHYS APPLY TOTAL CONTACT CAST ICD-10 Diagnosis Description L97.524 Non-pressure chronic ulcer of other part of left foot with necrosis of bone Quantity: 1 Electronic Signature(s) Signed: 06/24/2019 6:28:13 PM By: Zandra Abts RN, BSN Signed: 06/24/2019 8:16:21 PM By: Baltazar Najjar MD Entered By: Zandra Abts on 06/24/2019 18:22:24

## 2019-06-26 ENCOUNTER — Other Ambulatory Visit: Payer: Self-pay | Admitting: Internal Medicine

## 2019-06-26 DIAGNOSIS — K219 Gastro-esophageal reflux disease without esophagitis: Secondary | ICD-10-CM

## 2019-06-26 MED ORDER — PANTOPRAZOLE SODIUM 20 MG PO TBEC: 20.0000 mg | DELAYED_RELEASE_TABLET | Freq: Two times a day (BID) | ORAL | 2 refills | Status: DC

## 2019-06-26 MED ORDER — ATORVASTATIN CALCIUM 40 MG PO TABS: 40.0000 mg | ORAL_TABLET | Freq: Every day | ORAL | 2 refills | Status: DC

## 2019-06-26 NOTE — Telephone Encounter (Signed)
Rx for Janumet sent earlier today. Lisinopril still has a refill available. Will forward refills for Atorvastatin and pantoprazole. Kinnie Feil, BSN, RN-BC

## 2019-06-26 NOTE — Telephone Encounter (Signed)
Need refill on [name] pt also requested for all medicine to be refill  ;pt contact [phone]  [name] [address] [place] - [address]

## 2019-06-27 NOTE — Progress Notes (Signed)
[name], [name] ([id number]) Visit Report for 06/24/2019 Arrival Information Details Patient Name: Date of Service: [name], [name] 06/24/2019 3:15 PM Medical Record Number: [id number] Patient Account Number: [id number] Date of Birth/Sex: Treating RN: [birth year] (64 y.o. [name] Primary Care Leibish Mcgregor: MA SO UDI, [place] MA LSA DA T Other Clinician: Referring Laruth Hanger: Treating Anallely Rosell/Extender: Baltazar Najjar MA SO UDI, ELHA MA LSA DA T Weeks in Treatment: 21 Visit Information History Since Last Visit Added or deleted any medications: No Patient Arrived: Crutches Any new allergies or adverse reactions: No Arrival Time: 16:06 Had a fall or experienced change in No Accompanied By: self activities of daily living that may affect Transfer Assistance: None risk of falls: Patient Identification Verified: Yes Signs or symptoms of abuse/neglect since last visito No Secondary Verification Process Completed: Yes Hospitalized since last visit: No Patient Requires Transmission-Based Precautions: No Implantable device outside of the clinic excluding No Patient Has Alerts: No cellular tissue based products placed in the [name] since last visit: Has Dressing in Place as Prescribed: Yes Pain Present Now: No Electronic Signature(s) Signed: 06/27/2019 4:20:07 PM By: Karl Ito Entered By: Karl Ito on 06/24/2019 16:06:55 -------------------------------------------------------------------------------- Compression Therapy Details Patient Name: Date of Service: [name], [name] 06/24/2019 3:15 PM Medical Record Number: [id number] Patient Account Number: [id number] Date of Birth/Sex: Treating RN: [birth year] (64 y.o. [name] Primary Care Mikaeel Petrow: MA SO UDI, Judene Companion MA LSA DA T Other Clinician: Referring Gudrun Axe: Treating Allan Minotti/Extender: Baltazar Najjar MA SO UDI, ELHA MA LSA DA T Weeks in Treatment: 21 Compression Therapy Performed for Wound Assessment:  Wound #9 Right,Medial Foot Performed By: Clinician Zandra Abts, RN Compression Type: Three Layer Post Procedure Diagnosis Same as Pre-procedure Electronic Signature(s) Signed: 06/24/2019 6:28:13 PM By: Zandra Abts RN, BSN Entered By: Zandra Abts on 06/24/2019 16:44:23 -------------------------------------------------------------------------------- Encounter Discharge Information Details Patient Name: Date of Service: [name], [name] 06/24/2019 3:15 PM Medical Record Number: [id number] Patient Account Number: [id number] Date of Birth/Sex: Treating RN: [birth year] (64 y.o. [name] Primary Care Revella Shelton: MA SO UDI, Judene Companion MA LSA DA T Other Clinician: Referring Naama Sappington: Treating Monna Crean/Extender: Baltazar Najjar MA SO UDI, ELHA MA LSA DA T Weeks in Treatment: 21 Encounter Discharge Information Items Discharge Condition: Stable Ambulatory Status: Crutches Discharge Destination: Home Transportation: Private Auto Accompanied By: self Schedule Follow-up Appointment: Yes Clinical Summary of Care: Electronic Signature(s) Signed: 06/24/2019 5:09:44 PM By: Shawn Stall Entered By: Shawn Stall on 06/24/2019 17:07:24 -------------------------------------------------------------------------------- Lower Extremity Assessment Details Patient Name: Date of Service: [name], [name] 06/24/2019 3:15 PM Medical Record Number: [id number] Patient Account Number: [id number] Date of Birth/Sex: Treating RN: 1955-02-08 (64 y.o. F) [name] Primary Care Samarie Pinder: MA SO UDI, [place] MA LSA DA T Other Clinician: Referring Brenson Hartman: Treating Cody Albus/Extender: Baltazar Najjar MA SO UDI, ELHA MA LSA DA T Weeks in Treatment: 21 Edema Assessment Assessed: [Left: No] [Right: No] Edema: [Left: Yes] [Right: Yes] Calf Left: Right: Point of Measurement: 43 cm From Medial Instep 36 cm 39 cm Ankle Left: Right: Point of Measurement: 12 cm From Medial Instep 24 cm 24 cm Vascular  Assessment Pulses: Dorsalis Pedis Palpable: [Left:Yes] [Right:Yes] Electronic Signature(s) Signed: 06/24/2019 5:35:09 PM By: [name] Entered By: [name] on 06/24/2019 16:24:09 -------------------------------------------------------------------------------- Multi Wound Chart Details Patient Name: Date of Service: [name], [name] 06/24/2019 3:15 PM Medical Record Number: [id number] Patient Account Number: [id number] Date of Birth/Sex: Treating RN: 19-Jun-1955 (65 y.o. [name] Primary Care Obert Espindola: MA SO UDI, [name] MA LSA  DA T Other Clinician: Referring Kashtyn Jankowski: Treating Shawntay Prest/Extender: Baltazar Najjar MA SO UDI, ELHA MA LSA DA T Weeks in Treatment: 21 Vital Signs Height(in): 68 Capillary Blood Glucose(mg/dl): 462 Weight(lbs): 703 Pulse(bpm): 76 Body Mass Index(BMI): 40 Blood Pressure(mmHg): 123/80 Temperature(F): 98.6 Respiratory Rate(breaths/min): 18 Photos: [10:No Photos Left, Lateral, Plantar Foot] [9:No Photos Right, Medial Foot] [N/A:N/A N/A] Wound Location: [10:Blister] [9:Blister] [N/A:N/A] Wounding Event: [10:Diabetic Wound/Ulcer of the Lower] [9:Diabetic Wound/Ulcer of the Lower] [N/A:N/A] Primary Etiology: [10:Extremity Glaucoma, Chronic sinus] [9:Extremity Glaucoma, Chronic sinus] [N/A:N/A] Comorbid History: [10:problems/congestion, Anemia, Sleep Apnea, Hypertension, Type II Diabetes, Rheumatoid Arthritis, Neuropathy 11/18/2018] [9:problems/congestion, Anemia, Sleep Apnea, Hypertension, Type II Diabetes, Rheumatoid Arthritis, Neuropathy  06/18/2018] [N/A:N/A] Date Acquired: [10:21] [9:21] [N/A:N/A] Weeks of Treatment: [10:Open] [9:Open] [N/A:N/A] Wound Status: [10:0.7x0.8x0.3] [9:2.7x1.4x0.3] [N/A:N/A] Measurements L x W x D (cm) [10:0.44] [9:2.969] [N/A:N/A] A (cm) : rea [10:0.132] [9:0.891] [N/A:N/A] Volume (cm) : [10:75.00%] [9:73.00%] [N/A:N/A] % Reduction in A rea: [10:95.00%] [9:91.00%] [N/A:N/A] % Reduction in Volume:  [10:Grade 2] [9:Grade 2] [N/A:N/A] Classification: [10:Medium] [9:Medium] [N/A:N/A] Exudate A mount: [10:Serosanguineous] [9:Serosanguineous] [N/A:N/A] Exudate Type: [10:red, brown] [9:red, brown] [N/A:N/A] Exudate Color: [10:Well defined, not attached] [9:Well defined, not attached] [N/A:N/A] Wound Margin: [10:Large (67-100%)] [9:Large (67-100%)] [N/A:N/A] Granulation A mount: [10:Red] [9:Red, Pink] [N/A:N/A] Granulation Quality: [10:Small (1-33%)] [9:None Present (0%)] [N/A:N/A] Necrotic A mount: [10:Fat Layer (Subcutaneous Tissue)] [9:Fat Layer (Subcutaneous Tissue)] [N/A:N/A] Exposed Structures: [10:Exposed: Yes Fascia: No Tendon: No Muscle: No Joint: No Bone: No Medium (34-66%)] [9:Exposed: Yes Fascia: No Tendon: No Muscle: No Joint: No Bone: No Small (1-33%)] [N/A:N/A] Epithelialization: [10:T Contact Cast otal] [9:Compression Therapy] [N/A:N/A] Treatment Notes Wound #10 (Left, Lateral, Plantar Foot) 1. Cleanse With Wound Cleanser Soap and water 2. Periwound Care Moisturizing lotion 3. Primary Dressing Applied Calcium Alginate Ag 4. Secondary Dressing ABD Pad Dry Gauze Roll Gauze Kerramax/Xtrasorb Drawtex 5. Secured With Medipore tape 7. Footwear/Offloading device applied T Contact Cast otal Notes TCC applied by MD. Wound #9 (Right, Medial Foot) 1. Cleanse With Wound Cleanser Soap and water 2. Periwound Care Barrier cream Moisturizing lotion 3. Primary Dressing Applied Polymem Ag 4. Secondary Dressing ABD Pad Dry Gauze Kerramax/Xtrasorb 6. Support Layer Applied 3 layer compression wrap Notes stockinette. Electronic Signature(s) Signed: 06/24/2019 6:28:13 PM By: Zandra Abts RN, BSN Signed: 06/24/2019 8:16:21 PM By: Baltazar Najjar MD Entered By: Baltazar Najjar on 06/24/2019 18:08:45 -------------------------------------------------------------------------------- Multi-Disciplinary Care Plan Details Patient Name: Date of Service: [name], [name]  06/24/2019 3:15 PM Medical Record Number: [id number] Patient Account Number: [id number] Date of Birth/Sex: Treating RN: 1955/10/02 (64 y.o. [name] Primary Care Lainy Wrobleski: MA SO UDI, Judene Companion MA LSA DA T Other Clinician: Referring Katiria Calame: Treating Aviance [place]/Extender: Baltazar Najjar MA SO UDI, ELHA MA LSA DA T Weeks in Treatment: 21 Active Inactive Wound/Skin Impairment Nursing Diagnoses: Impaired tissue integrity Knowledge deficit related to ulceration/compromised skin integrity Goals: Patient/caregiver will verbalize understanding of skin care regimen Date Initiated: 01/28/2019 Target Resolution Date: 07/12/2019 Goal Status: Active Interventions: Assess patient/caregiver ability to obtain necessary supplies Assess patient/caregiver ability to perform ulcer/skin care regimen upon admission and as needed Assess ulceration(s) every visit Provide education on ulcer and skin care Notes: Electronic Signature(s) Signed: 06/24/2019 6:28:13 PM By: Zandra Abts RN, BSN Entered By: Zandra Abts on 06/24/2019 18:04:38 -------------------------------------------------------------------------------- Pain Assessment Details Patient Name: Date of Service: [name], [name] 06/24/2019 3:15 PM Medical Record Number: [id number] Patient Account Number: [id number] Date of Birth/Sex: Treating RN: September 21, 1955 (64 y.o. [name] Primary Care Suvan Stcyr: MA SO UDI, [facility] MA LSA  DA T Other Clinician: Referring Sender Rueb: Treating Astraea Gaughran/Extender: Linton Ham MA SO UDI, ELHA MA LSA DA T Weeks in Treatment: 21 Active Problems Location of Pain Severity and Description of Pain Patient Has Paino No Site Locations Pain Management and Medication Current Pain Management: Electronic Signature(s) Signed: 06/24/2019 6:28:13 PM By: Levan Hurst RN, BSN Signed: 06/27/2019 4:20:07 PM By: Sandre Kitty Entered By: Sandre Kitty on 06/24/2019  16:07:22 -------------------------------------------------------------------------------- Patient/Caregiver Education Details Patient Name: Date of Service: [name] 6/7/2021andnbsp3:15 PM Medical Record Number: [id number] Patient Account Number: [id number] Date of Birth/Gender: Treating RN: 04/18/1955 (64 y.o. [name] Primary Care Physician: MA SO UDI, Willette Pa MA LSA DA T Other Clinician: Referring Physician: Treating Physician/Extender: Linton Ham MA SO UDI, ELHA MA LSA DA T Weeks in Treatment: 21 Education Assessment Education Provided To: Patient Education Topics Provided Wound/Skin Impairment: Methods: Explain/Verbal Responses: State content correctly [employer]) Signed: 06/24/2019 6:28:13 PM By: Levan Hurst RN, BSN Entered By: Levan Hurst on 06/24/2019 18:04:48 -------------------------------------------------------------------------------- Wound Assessment Details Patient Name: Date of Service: [name], [name] 06/24/2019 3:15 PM Medical Record Number: [id number] Patient Account Number: [id number] Date of Birth/Sex: Treating RN: [birth year] (63 y.o. F) Dwiggins, Shannon Primary Care Moustapha Tooker: MA SO UDI, [place] MA LSA DA T Other Clinician: Referring Matilynn Dacey: Treating Shyane Fossum/Extender: Linton Ham MA SO UDI, ELHA MA LSA DA T Weeks in Treatment: 21 Wound Status Wound Number: 10 Primary Diabetic Wound/Ulcer of the Lower Extremity Etiology: Wound Location: Left, Lateral, Plantar Foot Wound Open Wounding Event: Blister Status: Date Acquired: 11/18/2018 Comorbid Glaucoma, Chronic sinus problems/congestion, Anemia, Sleep Weeks Of Treatment: 21 History: Apnea, Hypertension, Type II Diabetes, Rheumatoid Arthritis, Clustered Wound: No Neuropathy Photos Photo Uploaded By: Mikeal Hawthorne on 06/26/2019 11:29:53 Wound Measurements Length: (cm) 0.7 Width: (cm) 0.8 Depth: (cm) 0.3 Area: (cm) 0.44 Volume: (cm) 0.132 %  Reduction in Area: 75% % Reduction in Volume: 95% Epithelialization: Medium (34-66%) Tunneling: No Undermining: No Wound Description Classification: Grade 2 Wound Margin: Well defined, not attached Exudate Amount: Medium Exudate Type: Serosanguineous Exudate Color: red, brown Foul Odor After Cleansing: No Slough/Fibrino Yes Wound Bed Granulation Amount: Large (67-100%) Exposed Structure Granulation Quality: Red Fascia Exposed: No Necrotic Amount: Small (1-33%) Fat Layer (Subcutaneous Tissue) Exposed: Yes Necrotic Quality: Adherent Slough Tendon Exposed: No Muscle Exposed: No Joint Exposed: No Bone Exposed: No Treatment Notes Wound #10 (Left, Lateral, Plantar Foot) 1. Cleanse With Wound Cleanser Soap and water 2. Periwound Care Moisturizing lotion 3. Primary Dressing Applied Calcium Alginate Ag 4. Secondary Dressing ABD Pad Dry Gauze Roll Gauze Kerramax/Xtrasorb Drawtex 5. Secured With Medipore tape 7. Footwear/Offloading device applied T Contact Cast otal Notes TCC applied by MD. Electronic Signature(s) Signed: 06/24/2019 5:35:09 PM By: Kela Millin Entered By: Kela Millin on 06/24/2019 16:25:22 -------------------------------------------------------------------------------- Wound Assessment Details Patient Name: Date of Service: [name], Adysson J. 06/24/2019 3:15 PM Medical Record Number: [id number] Patient Account Number: [id number] Date of Birth/Sex: Treating RN: 02/14/1955 (64 y.o. F) Kela Millin Primary Care Shamila Lerch: MA SO UDI, [place] MA LSA DA T Other Clinician: Referring Zetha Kuhar: Treating Davey Bergsma/Extender: Linton Ham MA SO UDI, ELHA MA LSA DA T Weeks in Treatment: 21 Wound Status Wound Number: 9 Primary Diabetic Wound/Ulcer of the Lower Extremity Etiology: Wound Location: Right, Medial Foot Wound Open Wounding Event: Blister Status: Date Acquired: 06/18/2018 Comorbid Glaucoma, Chronic sinus problems/congestion, Anemia,  Sleep Weeks Of Treatment: 21 History: Apnea, Hypertension, Type II Diabetes, Rheumatoid Arthritis, Clustered Wound: No Neuropathy Photos Photo Uploaded By: Mikeal Hawthorne on 06/26/2019 11:29:54 Wound  Measurements Length: (cm) 2.7 Width: (cm) 1.4 Depth: (cm) 0.3 Area: (cm) 2.969 Volume: (cm) 0.891 % Reduction in Area: 73% % Reduction in Volume: 91% Epithelialization: Small (1-33%) Tunneling: No Undermining: No Wound Description Classification: Grade 2 Wound Margin: Well defined, not attached Exudate Amount: Medium Exudate Type: Serosanguineous Exudate Color: red, brown Foul Odor After Cleansing: No Slough/Fibrino No Wound Bed Granulation Amount: Large (67-100%) Exposed Structure Granulation Quality: Red, Pink Fascia Exposed: No Necrotic Amount: None Present (0%) Fat Layer (Subcutaneous Tissue) Exposed: Yes Tendon Exposed: No Muscle Exposed: No Joint Exposed: No Bone Exposed: No Treatment Notes Wound #9 (Right, Medial Foot) 1. Cleanse With Wound Cleanser Soap and water 2. Periwound Care Barrier cream Moisturizing lotion 3. Primary Dressing Applied Polymem Ag 4. Secondary Dressing ABD Pad Dry Gauze Kerramax/Xtrasorb 6. Support Layer Applied 3 layer compression wrap Notes stockinette. Electronic Signature(s) Signed: 06/24/2019 5:35:09 PM By: [name] Entered By: [name] on 06/24/2019 16:26:47 -------------------------------------------------------------------------------- Vitals Details Patient Name: Date of Service: [name], Kryssa J. 06/24/2019 3:15 PM Medical Record Number: [id number] Patient Account Number: [id number] Date of Birth/Sex: Treating RN: 03-Aug-1955 (64 y.o. [name] Primary Care Isiaha Greenup: MA SO UDI, [place] MA LSA DA T Other Clinician: Referring Tilden Broz: Treating Blaise Grieshaber/Extender: Baltazar Najjar MA SO UDI, ELHA MA LSA DA T Weeks in Treatment: 21 Vital Signs Time Taken: 16:06 Temperature (F): 98.6 Height  (in): 68 Pulse (bpm): 76 Weight (lbs): 265 Respiratory Rate (breaths/min): 18 Body Mass Index (BMI): 40.3 Blood Pressure (mmHg): 123/80 Capillary Blood Glucose (mg/dl): 496 Reference Range: 80 - 120 mg / dl Electronic Signature(s) Signed: 06/27/2019 4:20:07 PM By: Karl Ito Entered By: Karl Ito on 06/24/2019 16:07:15

## 2019-07-01 ENCOUNTER — Other Ambulatory Visit: Payer: Self-pay

## 2019-07-01 ENCOUNTER — Encounter (HOSPITAL_BASED_OUTPATIENT_CLINIC_OR_DEPARTMENT_OTHER): Payer: Medicare Other | Admitting: Physician Assistant

## 2019-07-01 DIAGNOSIS — E11621 Type 2 diabetes mellitus with foot ulcer: Secondary | ICD-10-CM | POA: Diagnosis not present

## 2019-07-02 NOTE — Progress Notes (Signed)
[name], [name] ([id number]) Visit Report for 07/01/2019 Chief Complaint Document Details Patient Name: Date of Service: [name], [name] 07/01/2019 1:30 PM Medical Record Number: [id number] Patient Account Number: [id number] Date of Birth/Sex: Treating RN: 1955-06-17 (64 y.o. [name] Primary Care Provider: MA SO UDI, [place] MA LSA DA T Other Clinician: Referring Provider: Treating Provider/Extender: Lenda Kelp MA SO UDI, [place] MA LSA DA T Weeks in Treatment: 22 Information Obtained from: Patient Chief Complaint Patients presents for follow up of a diabetic ulcer 01/28/2019; patient is here for review of diabetic foot ulcers bilaterally Electronic Signature(s) Signed: 07/01/2019 5:38:02 PM By: Zenaida Deed RN, BSN Signed: 07/01/2019 5:53:30 PM By: Lenda Kelp PA-C Previous Signature: 07/01/2019 2:05:19 PM Version By: Lenda Kelp PA-C Entered By: Zenaida Deed on 07/01/2019 14:15:53 -------------------------------------------------------------------------------- Debridement Details Patient Name: Date of Service: [name], [name] 07/01/2019 1:30 PM Medical Record Number: [id number] Patient Account Number: [id number] Date of Birth/Sex: Treating RN: April 13, 1955 (64 y.o. F) Zenaida Deed Primary Care Provider: MA SO UDI, [place] MA LSA DA T Other Clinician: Referring Provider: Treating Provider/Extender: Lenda Kelp MA SO UDI, [place] MA LSA DA T Weeks in Treatment: 22 Debridement Performed for Assessment: Wound #10 Left,Lateral,Plantar Foot Performed By: Physician Lenda Kelp, PA Debridement Type: Debridement Severity of Tissue Pre Debridement: Fat layer exposed Level of Consciousness (Pre-procedure): Awake and Alert Pre-procedure Verification/Time Out Yes - 14:13 Taken: Start Time: 14:14 Pain Control: Lidocaine 4% T opical Solution T Area Debrided (L x W): otal 2 (cm) x 2 (cm) = 4 (cm) Tissue and other material debrided: Viable, Non-Viable,  Callus, Slough, Subcutaneous, Skin: Epidermis, Slough Level: Skin/Subcutaneous Tissue Debridement Description: Excisional Instrument: Curette Bleeding: Minimum Hemostasis Achieved: Pressure End Time: 14:19 Procedural Pain: 0 Post Procedural Pain: 0 Response to Treatment: Procedure was tolerated well Level of Consciousness (Post- Awake and Alert procedure): Post Debridement Measurements of Total Wound Length: (cm) 1 Width: (cm) 1 Depth: (cm) 0.1 Volume: (cm) 0.079 Character of Wound/Ulcer Post Debridement: Improved Severity of Tissue Post Debridement: Fat layer exposed Post Procedure Diagnosis Same as Pre-procedure Electronic Signature(s) Signed: 07/01/2019 5:38:02 PM By: Zenaida Deed RN, BSN Signed: 07/01/2019 5:53:30 PM By: Lenda Kelp PA-C Entered By: Zenaida Deed on 07/01/2019 14:19:06 -------------------------------------------------------------------------------- Debridement Details Patient Name: Date of Service: [name], [name] 07/01/2019 1:30 PM Medical Record Number: [id number] Patient Account Number: [id number] Date of Birth/Sex: Treating RN: 08/18/1955 (64 y.o. [name] Primary Care Provider: MA SO UDI, [place] MA LSA DA T Other Clinician: Referring Provider: Treating Provider/Extender: Lenda Kelp MA SO UDI, [place] MA LSA DA T Weeks in Treatment: 22 Debridement Performed for Assessment: Wound #9 Right,Medial Foot Performed By: Physician Lenda Kelp, PA Debridement Type: Debridement Severity of Tissue Pre Debridement: Fat layer exposed Level of Consciousness (Pre-procedure): Awake and Alert Pre-procedure Verification/Time Out Yes - 14:13 Taken: Start Time: 14:18 Pain Control: Lidocaine 4% T opical Solution T Area Debrided (L x W): otal 3.5 (cm) x 3 (cm) = 10.5 (cm) Tissue and other material debrided: Viable, Non-Viable, Callus, Slough, Subcutaneous, Skin: Epidermis, Slough Level: Skin/Subcutaneous Tissue Debridement Description:  Excisional Instrument: Curette Bleeding: Minimum Hemostasis Achieved: Pressure End Time: 14:22 Procedural Pain: 0 Post Procedural Pain: 0 Response to Treatment: Procedure was tolerated well Level of Consciousness (Post- Awake and Alert procedure): Post Debridement Measurements of Total Wound Length: (cm) 2.5 Width: (cm) 2.2 Depth: (cm) 0.3 Volume: (cm) 1.296 Character of Wound/Ulcer Post Debridement: Improved Severity of Tissue Post Debridement: Fat  layer exposed Post Procedure Diagnosis Same as Pre-procedure Electronic Signature(s) Signed: 07/01/2019 5:38:02 PM By: Zenaida Deed RN, BSN Signed: 07/01/2019 5:53:30 PM By: Lenda Kelp PA-C Entered By: Zenaida Deed on 07/01/2019 14:20:38 -------------------------------------------------------------------------------- HPI Details Patient Name: Date of Service: [name], [name] 07/01/2019 1:30 PM Medical Record Number: [id number] Patient Account Number: [id number] Date of Birth/Sex: Treating RN: [birth year] (64 y.o. [name] Primary Care Provider: MA SO UDI, [place] MA LSA DA T Other Clinician: Referring Provider: Treating Provider/Extender: Baltazar Najjar MA SO UDI, [place] MA LSA DA T Weeks in Treatment: 22 History of Present Illness HPI Description: 64 yrs old bf here for follow up recurrent left charcot foot ulcer. She has DM. She is not a smoker. She states a blister developed 3 months ago at site of previous breakdown from 1 year ago. It was debrided at the podiatrist's office. She went to the ER and then sent here. She denies pain. Xray was negative for osteo. Culture from this clinic negative. 09/08/14 Referred by [name] to [name] for Charcot foot. Seen by him and MRI scheduled 09/19/14. Prior silver collagen but this was not ordered last visit, patient has been rinsing with saline alone. Re institute silver collagen every other day. F/u 2 weeks with [name] as Labor Day holiday. Supplies  ordered. 09/25/14; this is a patient with a Charcot foot on the left. she has a wound over the plantar aspect of her left calcaneus. She has been seen by [name] of orthopedic surgery and has had an MRI. She is apparently going within the next week or 2 for corrective surgery which will involve an external fixator. I don't have any of the details of this. 10/06/14; The patient is a 64 yrs old bf with a left Charcot foot and ulcer on the plantar. 12/01/14 Returns post surgery with [name]. Has follow up visit later this week with him, currently in facility and NWB over this extremity. States no drainage from foot and no current wound care. On exam callus and scab in place, suspect healed. Has external fixator in place. Will defer to [name] for management, ok to place moisturizing cream over scabs and callus and she will call if she requires follow up here. READMISSION 01/28/2019 Patient was in the clinic in 2016 for a wound on the plantar aspect of her left calcaneus. Predominantly cared for by [name] she was referred to [name] and had corrective surgery for the position of her left ankle I believe. She had previously been here in 2015 and then prior to that in 2011 2012 that I do not have these records. More recently she has developed fairly extensive wounds on the right medial foot, left lateral foot and a small area on the right medial great toe. Right medial foot wound has been there for 6 to 7 months, left foot wound for 2 to 3 months and the right great toe only over the last few weeks. She has been followed by [name] at Dr. Laverta [place] office it sounds as though she has been undergoing callus paring and silver nitrate. The patient has been washing these off with soap and water and plan applying dry dressing Past medical history, type 2 diabetes well controlled with a recent hemoglobin A1c of 7 on 11/16, type 2 diabetes with peripheral neuropathy, previous left Charcot  joint surgery bilateral Charcot joints currently, stage III chronic renal failure, hypertension, obstructive sleep apnea, history of MRSA and gastroesophageal reflux.  1/18; this is a patient with bilateral Charcot feet. Plain x-rays that I did of both of these wounds that are either at bone or precariously close to bone were done. On the right foot this showed possible lytic destruction involving the anterior aspect of the talus which may represent a wound osteomyelitis. An MRI was recommended. On the left there was no definite lytic destruction although the wound is deep undermining's and I think probably requires an MRI on this basis in any case 1/25; patient was supposed to have her MRI last Friday of her bilateral feet however they would not do it because there was silver alginate in her dressings, will replace with calcium alginate today and will see if we can get her done today 2/1; patient did not get her MRIs done last week because of issues with the MRI department receiving orders to do bilateral feet. She has 2 deep wounds in the setting of Charcot feet deformities bilaterally. Both of them at one point had exposed bone although I had trouble demonstrating that today. I am not sure that we can offload these very aggressively as I watched her leave the clinic last week her gait is very narrow and unsteady. 2/15; the MRI of her right foot did not show osteomyelitis potentially osteonecrosis of the talus. 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 The wounds are a large wound on the right medial foot and on the left a substantial wound on the left lateral plantar calcaneus. We have been using silver alginate. Ultimately I think we need to consider a total contact cast on the right while we work through the possibility of osteomyelitis in the left. I watched her walk out with her crutches I have some trepidation about the ability to walk with the  cast on her right foot. Nevertheless with her Charcot deformities I do not think there is a way to heal these short of offloading them aggressively. 2/22; the culture of the bone in the left foot that I did last week showed a few Citrobacter Koseri as well as some streptococcal species. In my attempted bone for pathology did not actually yield a lot of bone the pathologist did not identify any osteomyelitis. 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 Is promised today and aggressive debridement of the right foot and a total contact cast which surprisingly she seemed to tolerate quite well 03/13/2019 upon evaluation today patient appears to be doing well with her total contact cast she is here for the first cast change. She had no areas of rubbing or any other complications at this point. Overall things are doing quite well. 2/26; the patient was kindly seen by Dr. Orvan Falconer of infectious disease on 2/25. She is now on IV vancomycin PICC line placement in 2 days and oral Flagyl. This was in response to her MRI showing osteomyelitis of the left calcaneus concerning for osteomyelitis. We have been putting her right leg in a total contact cast. Our nurses report drainage. She is tolerating the total contact cast well. 3/4; the patient is started her IV antibiotics and is taking IV vancomycin and oral Flagyl. She appears to be tolerating these both well. This is for osteomyelitis involving the left calcaneus. The area on the right in the setting of bilateral Charcot deformities did not  have any bone infection on MRI. We put a total contact cast on her with silver alginate as the primary dressing and she returns today in follow-up. Per our intake nurse there was too much drainage to leave this on all week therefore we will bring her back for an early change 3/8;  follow-up total contact cast she is still having a lot of drainage probably too much drainage from the right foot wound will week with the same cast. She will be back on Thursday. The area on the left looks somewhat better 3/11; patient here for a total contact cast change. 3/15; patient came in for wound care evaluation. She is still on IV vancomycin and oral Flagyl for osteomyelitis in the left foot. The area on the right foot we are putting in a total contact cast. 3/18 for total contact cast change on the right foot 3/22; the patient was here for wound review today. The area on the right foot is slightly smaller in terms of surface area. However the surface of the wound is very gritty and hyper granulated. More problematically the area on the plantar left foot has increased depth indeed in the center of this it probes to bone. We have been using Hydrofera Blue in both areas. She is on antibiotics as prescribed by infectious disease IV vancomycin and oral Flagyl 3/25; the patient is in for total contact cast change. Our intake nurse was concerned about the amount of drainage which soaked right to the multiple layers we are putting on this. Wondered about options. The wound bed actually looks as healthy as this is looked although there may not be a lot of change in dimensions things are looking a lot better. If we are going to heal this woman's wound which is in the middle of her right Charcot foot this is going to need to be offloaded. There are not many alternatives 3/29; still not a lot of improvement although the surface of the right wound looks better. We have been using Hydrofera Blue 04/17/2019 upon evaluation today patient appears to be doing well with a total contact cast. With actually having to change this twice a week. She saw [name] for wound care earlier in the week this is for the cast change today. That is due to the fact that she has significant drainage. Overall the wound is  been looking excellent however. 4/5; we continue to put a total contact cast on this patient with Hydrofera Blue. Still a lot of drainage which precludes a simple weekly change or changing this twice a week. 4/8; total contact cast changed today. Apparently the wound looks better per our intake nurse 4/12; patient here for wound care evaluation. Wounds on the right foot have not changed in dimension none about a month left foot looks similar. Apparently her antibiotics that we are giving her for osteomyelitis in the left foot complete on Wednesday. We have been using Hydrofera Blue on the right foot under a total contact cast. Nurses report greenish drainage although I think this may be discoloration from the [facility] 4/15; back for cast change wounds look quite good although still moderate amount of drainage. T contact cast reapplied otal 4/19;. Wound evaluation. Wound on the right foot is smaller surface looks healthy. There is still the divot in the middle part of this wound but I could not feel any bone. Area on the left foot also looks better She has completed her antibiotics but still has the PICC line  in. 4/23; patient comes back for a total contact cast change this was done in the [name] fashion no issues 4/26; wound measures slightly larger on the right foot which is disappointing. She sees Dr. Orvan Falconer with regards to her antibiotics on Wednesday. I believe she is on vancomycin IV and oral Flagyl. 4/29; in for her routine total contact cast change. She saw Dr. Orvan Falconer this morning. He is asking about antibiotic continuation I will be in touch with him. I did not look at her wound today 5/3; patient saw Dr. Orvan Falconer on 4/28. I did send him a note asking about the continuation of perhaps an oral regimen. I have not heard back. She still has an elevated sedimentation rate at 81. Her wounds are smaller. 5/6; in for her total contact cast change. We did not look at the wound today.  She has seen Dr. Orvan Falconer and is now on oral Keflex and Flagyl. She seems to be tolerating these well 5/10; she had her PICC line removed. The wound on the right foot after some initial improvement has not made any improvement even with the total contact cast. Still having a lot of drainage. Likewise the area on the left foot really is not any better either 5/14; in for a cast change today. We will look at the right foot on Monday. If this is not making any progress I am going to try to cast the left foot. 5/17; the wound on the right foot is absolutely no different. I am going to put ointment on this area and change the cast of the left foot. We have been using silver alginate there 5/21; apparently the area on the right foot was better per our intake nurse although I did not see it. We did a [name] total contact cast change on the left foot we have been applying silver alginate to the wound here. 06/10/19-Area on the right foot per dimensions slightly better, the left foot has still some depth applying silver alginate to the wound with TCC 06/13/19-Patient here for Casting to Left foot 6/1; patient had the total contact cast were placed on the left foot. Noted some swelling in her right leg although the dimensions of the wound on the right foot are better. We have been using PolyMem on the right and silver alginate on the left under the cast 6/7; we continue to make nice progress in both her plantar feet wound. We are putting the total contact cast on the left. Surprisingly in spite of this the area on the right medial foot in the middle of her Charcot deformities actually is making progress as well 07/01/2019 upon evaluation today patient actually appears to be doing quite well with regard to her wounds on the feet compared to when I saw her last that has been quite some time. With that being said I do believe the total contact cast has been beneficial in this left foot and she has a lot of signs  of improvement she does have some callus overhanging which is caused a little bit of a ledge and an issue here. I do believe that we may be able to do something to help in that regard. Fortunately otherwise the wound seems to be progressing quite nicely. Electronic Signature(s) Signed: 07/01/2019 2:53:46 PM By: Lenda Kelp PA-C Signed: 07/02/2019 5:55:30 PM By: Baltazar Najjar MD Entered By: Lenda Kelp on 07/01/2019 14:53:46 -------------------------------------------------------------------------------- Physical Exam Details Patient Name: Date of Service: [name], [name] 07/01/2019 1:30 PM  Medical Record Number: [id number] Patient Account Number: [id number] Date of Birth/Sex: Treating RN: 1955-09-08 (64 y.o. [name] Primary Care Provider: MA SO UDI, [place] MA LSA DA T Other Clinician: Referring Provider: Treating Provider/Extender: Lenda Kelp MA SO UDI, [place] MA LSA DA T Weeks in Treatment: 22 Constitutional Well-nourished and well-hydrated in no acute distress. Respiratory normal breathing without difficulty. Psychiatric this patient is able to make decisions and demonstrates good insight into disease process. Alert and Oriented x 3. pleasant and cooperative. Notes Upon inspection patient's wound bed actually showed signs of good granulation at this time. There does not appear to be any signs of active infection which is also good news. Overall very pleased with where things stand. Electronic Signature(s) Signed: 07/01/2019 2:54:00 PM By: Lenda Kelp PA-C Entered By: Lenda Kelp on 07/01/2019 14:54:00 -------------------------------------------------------------------------------- Physician Orders Details Patient Name: Date of Service: [name], [name] 07/01/2019 1:30 PM Medical Record Number: [id number] Patient Account Number: [id number] Date of Birth/Sex: Treating RN: 15-Dec-1955 (64 y.o. [name] Primary Care Provider: MA SO UDI,  [place] MA LSA DA T Other Clinician: Referring Provider: Treating Provider/Extender: Lenda Kelp MA SO UDI, [place] MA LSA DA T Weeks in Treatment: 42 Verbal / Phone Orders: No Diagnosis Coding ICD-10 Coding Code Description E11.621 Type 2 diabetes mellitus with foot ulcer L97.524 Non-pressure chronic ulcer of other part of left foot with necrosis of bone L97.511 Non-pressure chronic ulcer of other part of right foot limited to breakdown of skin L97.514 Non-pressure chronic ulcer of other part of right foot with necrosis of bone E11.42 Type 2 diabetes mellitus with diabetic polyneuropathy M14.671 Charcot's joint, right ankle and foot M14.672 Charcot's joint, left ankle and foot 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 Dressing Change Frequency Wound #10 Left,Lateral,Plantar Foot Do not change entire dressing for one week. Wound #9 Right,Medial Foot Do not change entire dressing for one week. Wound Cleansing May shower with protection. Primary Wound Dressing Wound #10 Left,Lateral,Plantar Foot Calcium Alginate with Silver Wound #9 Right,Medial Foot Polymem Silver Secondary Dressing Wound #10 Left,Lateral,Plantar Foot Foam - donut Kerlix/Rolled Gauze Dry Gauze Drawtex - cut to fit inside wound edges Wound #9 Right,Medial Foot Dry Gauze ABD pad Zetuvit or Kerramax Edema Control 3 Layer Compression System - Right Lower Extremity Avoid standing for long periods of time Elevate legs to the level of the heart or above for 30 minutes daily and/or when sitting, a frequency of: - throughout the day Exercise regularly Off-Loading Total Contact Cast to Left Lower Extremity Open toe surgical shoe to: - right foot Electronic Signature(s) Signed: 07/01/2019 5:38:02 PM By: Zenaida Deed RN, BSN Signed: 07/01/2019 5:53:30 PM By: Lenda Kelp PA-C Entered By: Zenaida Deed on 07/01/2019  14:26:21 -------------------------------------------------------------------------------- Problem List Details Patient Name: Date of Service: [name], [name] 07/01/2019 1:30 PM Medical Record Number: [id number] Patient Account Number: [id number] Date of Birth/Sex: Treating RN: 1955-12-17 (64 y.o. [name] Primary Care Provider: MA SO UDI, [place] MA LSA DA T Other Clinician: Referring Provider: Treating Provider/Extender: Lenda Kelp MA SO UDI, [place] MA LSA DA T Weeks in Treatment: 22 Active Problems ICD-10 Encounter Code Description Active Date MDM Diagnosis E11.621 Type 2 diabetes mellitus with foot ulcer 01/28/2019 No Yes L97.524 Non-pressure chronic ulcer of other part of left foot with necrosis of bone 01/28/2019 No Yes L97.511 Non-pressure chronic ulcer of other part of right foot limited to breakdown of 01/28/2019  No Yes skin L97.514 Non-pressure chronic ulcer of other part of right foot with necrosis of bone 01/28/2019 No Yes E11.42 Type 2 diabetes mellitus with diabetic polyneuropathy 01/28/2019 No Yes M14.671 Charcot's joint, right ankle and foot 01/28/2019 No Yes M14.672 Charcot's joint, left ankle and foot 01/28/2019 No Yes 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 07/01/2019 1:30 PM Medical Record Number: [id number] Patient Account Number: [id number] Date of Birth/Sex: Treating RN: 1955-10-17 (64 y.o. [name] Primary Care Provider: MA SO UDI, [place] MA LSA DA T Other  Clinician: Referring Provider: Treating Provider/Extender: Lenda Kelp MA SO UDI, [place] MA LSA DA T Weeks in Treatment: 22 Subjective Chief Complaint Information obtained from Patient Patients presents for follow up of a diabetic ulcer 01/28/2019; patient is here for review of diabetic foot ulcers bilaterally History of Present Illness (HPI) 64 yrs old bf here for follow up recurrent left charcot foot ulcer. She has DM. She is not a smoker. She states a blister developed 3 months ago at site of previous breakdown from 1 year ago. It was debrided at the podiatrist's office. She went to the ER and then sent here. She denies pain. Xray was negative for osteo. Culture from this clinic negative. 09/08/14 Referred by [name] to [name] for Charcot foot. Seen by him and MRI scheduled 09/19/14. Prior silver collagen but this was not ordered last visit, patient has been rinsing with saline alone. Re institute silver collagen every other day. F/u 2 weeks with [name] as Labor Day holiday. Supplies ordered. 09/25/14; this is a patient with a Charcot foot on the left. she has a wound over the plantar aspect of her left calcaneus. She has been seen by [name] of orthopedic surgery and has had an MRI. She is apparently going within the next week or 2 for corrective surgery which will involve an external fixator. I don't have any of the details of this. 10/06/14; The patient is a 64 yrs old bf with a left Charcot foot and ulcer on the plantar. 12/01/14 Returns post surgery with [name]. Has follow up visit later this week with him, currently in facility and NWB over this extremity. States no drainage from foot and no current wound care. On exam callus and scab in place, suspect healed. Has external fixator in place. Will defer to [name] for management, ok to place moisturizing cream over scabs and callus and she will call if she requires follow up here. READMISSION 01/28/2019 Patient was in  the clinic in 2016 for a wound on the plantar aspect of her left calcaneus. Predominantly cared for by [name] she was referred to [name] and had corrective surgery for the position of her left ankle I believe. She had previously been here in 2015 and then prior to that in 2011 2012 that I do not have these records. More recently she has developed fairly extensive wounds on the right medial foot, left lateral foot and a small area on the right medial great toe. Right medial foot wound has been there for 6 to 7 months, left foot wound for 2 to 3 months and  the right great toe only over the last few weeks. She has been followed by [name] at Dr. [name] office it sounds as though she has been undergoing callus paring and silver nitrate. The patient has been washing these off with soap and water and plan applying dry dressing Past medical history, type 2 diabetes well controlled with a recent hemoglobin A1c of 7 on 11/16, type 2 diabetes with peripheral neuropathy, previous left Charcot joint surgery bilateral Charcot joints currently, stage III chronic renal failure, hypertension, obstructive sleep apnea, history of MRSA and gastroesophageal reflux. 1/18; this is a patient with bilateral Charcot feet. Plain x-rays that I did of both of these wounds that are either at bone or precariously close to bone were done. On the right foot this showed possible lytic destruction involving the anterior aspect of the talus which may represent a wound osteomyelitis. An MRI was recommended. On the left there was no definite lytic destruction although the wound is deep undermining's and I think probably requires an MRI on this basis in any case 1/25; patient was supposed to have her MRI last Friday of her bilateral feet however they would not do it because there was silver alginate in her dressings, will replace with calcium alginate today and will see if we can get her done today 2/1; patient did not  get her MRIs done last week because of issues with the MRI department receiving orders to do bilateral feet. She has 2 deep wounds in the setting of Charcot feet deformities bilaterally. Both of them at one point had exposed bone although I had trouble demonstrating that today. I am not sure that we can offload these very aggressively as I watched her leave the clinic last week her gait is very narrow and unsteady. 2/15; the MRI of her right foot did not show osteomyelitis potentially osteonecrosis of the talus. 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 The wounds are a large wound on the right medial foot and on the left a substantial wound on the left lateral plantar calcaneus. We have been using silver alginate. Ultimately I think we need to consider a total contact cast on the right while we work through the possibility of osteomyelitis in the left. I watched her walk out with her crutches I have some trepidation about the ability to walk with the cast on her right foot. Nevertheless with her Charcot deformities I do not think there is a way to heal these short of offloading them aggressively. 2/22; the culture of the bone in the left foot that I did last week showed a few Citrobacter Koseri as well as some streptococcal species. In my attempted bone for pathology did not actually yield a lot of bone the pathologist did not identify any osteomyelitis. 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 Is promised today and aggressive debridement of the right foot and a total contact cast which surprisingly she seemed to tolerate quite well 03/13/2019 upon evaluation today patient appears to be doing well with her total contact cast she is here for the first cast change. She had no areas of rubbing or any other  complications at this point. Overall things are doing quite well. 2/26; the patient was kindly seen by Dr. Megan Salon of infectious disease on 2/25. She is now  on IV vancomycin PICC line placement in 2 days and oral Flagyl. This was in response to her MRI showing osteomyelitis of the left calcaneus concerning for osteomyelitis. We have been putting her right leg in a total contact cast. Our nurses report drainage. She is tolerating the total contact cast well. 3/4; the patient is started her IV antibiotics and is taking IV vancomycin and oral Flagyl. She appears to be tolerating these both well. This is for osteomyelitis involving the left calcaneus. The area on the right in the setting of bilateral Charcot deformities did not have any bone infection on MRI. We put a total contact cast on her with silver alginate as the primary dressing and she returns today in follow-up. Per our intake nurse there was too much drainage to leave this on all week therefore we will bring her back for an early change 3/8; follow-up total contact cast she is still having a lot of drainage probably too much drainage from the right foot wound will week with the same cast. She will be back on Thursday. The area on the left looks somewhat better 3/11; patient here for a total contact cast change. 3/15; patient came in for wound care evaluation. She is still on IV vancomycin and oral Flagyl for osteomyelitis in the left foot. The area on the right foot we are putting in a total contact cast. 3/18 for total contact cast change on the right foot 3/22; the patient was here for wound review today. The area on the right foot is slightly smaller in terms of surface area. However the surface of the wound is very gritty and hyper granulated. More problematically the area on the plantar left foot has increased depth indeed in the center of this it probes to bone. We have been using Hydrofera Blue in both areas. She is on antibiotics as  prescribed by infectious disease IV vancomycin and oral Flagyl 3/25; the patient is in for total contact cast change. Our intake nurse was concerned about the amount of drainage which soaked right to the multiple layers we are putting on this. Wondered about options. The wound bed actually looks as healthy as this is looked although there may not be a lot of change in dimensions things are looking a lot better. If we are going to heal this woman's wound which is in the middle of her right Charcot foot this is going to need to be offloaded. There are not many alternatives 3/29; still not a lot of improvement although the surface of the right wound looks better. We have been using Hydrofera Blue 04/17/2019 upon evaluation today patient appears to be doing well with a total contact cast. With actually having to change this twice a week. She saw [name] for wound care earlier in the week this is for the cast change today. That is due to the fact that she has significant drainage. Overall the wound is been looking excellent however. 4/5; we continue to put a total contact cast on this patient with Hydrofera Blue. Still a lot of drainage which precludes a simple weekly change or changing this twice a week. 4/8; total contact cast changed today. Apparently the wound looks better per our intake nurse 4/12; patient here for wound care evaluation. Wounds on the right foot have not changed in dimension none about a month left foot looks similar. Apparently her antibiotics that we are giving her for osteomyelitis in the left foot complete on Wednesday. We have been using  Hydrofera Blue on the right foot under a total contact cast. Nurses report greenish drainage although I think this may be discoloration from the [facility] 4/15; back for cast change wounds look quite good although still moderate amount of drainage. T contact cast reapplied otal 4/19;. Wound evaluation. Wound on the right foot is smaller  surface looks healthy. There is still the divot in the middle part of this wound but I could not feel any bone. Area on the left foot also looks better She has completed her antibiotics but still has the PICC line in. 4/23; patient comes back for a total contact cast change this was done in the [name] fashion no issues 4/26; wound measures slightly larger on the right foot which is disappointing. She sees Dr. Orvan Falconer with regards to her antibiotics on Wednesday. I believe she is on vancomycin IV and oral Flagyl. 4/29; in for her routine total contact cast change. She saw Dr. Orvan Falconer this morning. He is asking about antibiotic continuation I will be in touch with him. I did not look at her wound today 5/3; patient saw Dr. Orvan Falconer on 4/28. I did send him a note asking about the continuation of perhaps an oral regimen. I have not heard back. She still has an elevated sedimentation rate at 81. Her wounds are smaller. 5/6; in for her total contact cast change. We did not look at the wound today. She has seen Dr. Orvan Falconer and is now on oral Keflex and Flagyl. She seems to be tolerating these well 5/10; she had her PICC line removed. The wound on the right foot after some initial improvement has not made any improvement even with the total contact cast. Still having a lot of drainage. Likewise the area on the left foot really is not any better either 5/14; in for a cast change today. We will look at the right foot on Monday. If this is not making any progress I am going to try to cast the left foot. 5/17; the wound on the right foot is absolutely no different. I am going to put ointment on this area and change the cast of the left foot. We have been using silver alginate there 5/21; apparently the area on the right foot was better per our intake nurse although I did not see it. We did a [name] total contact cast change on the left foot we have been applying silver alginate to the wound  here. 06/10/19-Area on the right foot per dimensions slightly better, the left foot has still some depth applying silver alginate to the wound with TCC 06/13/19-Patient here for Casting to Left foot 6/1; patient had the total contact cast were placed on the left foot. Noted some swelling in her right leg although the dimensions of the wound on the right foot are better. We have been using PolyMem on the right and silver alginate on the left under the cast 6/7; we continue to make nice progress in both her plantar feet wound. We are putting the total contact cast on the left. Surprisingly in spite of this the area on the right medial foot in the middle of her Charcot deformities actually is making progress as well 07/01/2019 upon evaluation today patient actually appears to be doing quite well with regard to her wounds on the feet compared to when I saw her last that has been quite some time. With that being said I do believe the total contact cast has been beneficial in this left foot  and she has a lot of signs of improvement she does have some callus overhanging which is caused a little bit of a ledge and an issue here. I do believe that we may be able to do something to help in that regard. Fortunately otherwise the wound seems to be progressing quite nicely. Objective Constitutional Well-nourished and well-hydrated in no acute distress. Vitals Time Taken: 1:44 PM, Height: 68 in, Weight: 265 lbs, BMI: 40.3, Temperature: 98 F, Pulse: 68 bpm, Respiratory Rate: 18 breaths/min, Blood Pressure: 120/71 mmHg. Respiratory normal breathing without difficulty. Psychiatric this patient is able to make decisions and demonstrates good insight into disease process. Alert and Oriented x 3. pleasant and cooperative. General Notes: Upon inspection patient's wound bed actually showed signs of good granulation at this time. There does not appear to be any signs of active infection which is also good news. Overall  very pleased with where things stand. Integumentary (Hair, Skin) Wound #10 status is Open. Original cause of wound was Blister. The wound is located on the Left,Lateral,Plantar Foot. The wound measures 1cm length x 1.1cm width x 0.3cm depth; 0.864cm^2 area and 0.259cm^3 volume. There is Fat Layer (Subcutaneous Tissue) Exposed exposed. There is no tunneling or undermining noted. There is a medium amount of serosanguineous drainage noted. The wound margin is well defined and not attached to the wound base. There is large (67-100%) red granulation within the wound bed. There is a small (1-33%) amount of necrotic tissue within the wound bed including Adherent Slough. Wound #9 status is Open. Original cause of wound was Blister. The wound is located on the Right,Medial Foot. The wound measures 2.5cm length x 2.2cm width x 0.3cm depth; 4.32cm^2 area and 1.296cm^3 volume. There is Fat Layer (Subcutaneous Tissue) Exposed exposed. There is a medium amount of serosanguineous drainage noted. The wound margin is well defined and not attached to the wound base. There is large (67-100%) red, pink granulation within the wound bed. There is no necrotic tissue within the wound bed. 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 There was a Excisional Skin/Subcutaneous  Tissue Debridement with a total area of 4 sq cm performed by Lenda Kelp, PA. With the following instrument(s): Curette to remove Viable and Non-Viable tissue/material. Material removed includes Callus, Subcutaneous Tissue, Slough, and Skin: Epidermis after achieving pain control using Lidocaine 4% Topical Solution. No specimens were taken. A time out was conducted at 14:13, prior to the start of the procedure. A Minimum amount of bleeding was controlled with Pressure. The procedure was tolerated well with a pain level of 0 throughout and a pain level of 0 following the procedure. Post Debridement Measurements: 1cm length x 1cm width x 0.1cm depth; 0.079cm^3 volume. Character of Wound/Ulcer Post Debridement is improved. Severity of Tissue Post Debridement is: Fat layer exposed. Post procedure Diagnosis Wound #10: Same as Pre-Procedure Pre-procedure diagnosis of Wound #10 is a Diabetic Wound/Ulcer of the Lower  Extremity located on the Left,Lateral,Plantar Foot . There was a T Contact otal Cast Procedure by Lenda Kelp, PA. Post procedure Diagnosis Wound #10: Same as Pre-Procedure Wound #9 Pre-procedure diagnosis of Wound #9 is a Diabetic Wound/Ulcer of the Lower Extremity located on the Right,Medial Foot .Severity of Tissue Pre Debridement is: Fat layer exposed. There was a Excisional Skin/Subcutaneous Tissue Debridement with a total area of 10.5 sq cm performed by Lenda Kelp, PA. With the following instrument(s): Curette to remove Viable and Non-Viable tissue/material. Material removed includes Callus, Subcutaneous Tissue, Slough, and Skin: Epidermis after achieving pain control using Lidocaine 4% Topical Solution. No specimens were taken. A time out was conducted at 14:13, prior to the start of the procedure. A Minimum amount of bleeding was controlled with Pressure. The procedure was tolerated well with a pain level of 0 throughout and a pain level of 0 following the procedure. Post  Debridement Measurements: 2.5cm length x 2.2cm width x 0.3cm depth; 1.296cm^3 volume. Character of Wound/Ulcer Post Debridement is improved. Severity of Tissue Post Debridement is: Fat layer exposed. Post procedure Diagnosis Wound #9: Same as Pre-Procedure Pre-procedure diagnosis of Wound #9 is a Diabetic Wound/Ulcer of the Lower Extremity located on the Right,Medial Foot . There was a Three Layer Compression Therapy Procedure by [name], RN. Post procedure Diagnosis Wound #9: Same as Pre-Procedure Plan Follow-up Appointments: Return Appointment in 1 week. Dressing Change Frequency: Wound #10 Left,Lateral,Plantar Foot: Do not change entire dressing for one week. Wound #9 Right,Medial Foot: Do not change entire dressing for one week. Wound Cleansing: May shower with protection. Primary Wound Dressing: Wound #10 Left,Lateral,Plantar Foot: Calcium Alginate with Silver Wound #9 Right,Medial Foot: Polymem Silver Secondary Dressing: Wound #10 Left,Lateral,Plantar Foot: Foam - donut Kerlix/Rolled Gauze Dry Gauze Drawtex - cut to fit inside wound edges Wound #9 Right,Medial Foot: Dry Gauze ABD pad Zetuvit or Kerramax Edema Control: 3 Layer Compression System - Right Lower Extremity Avoid standing for long periods of time Elevate legs to the level of the heart or above for 30 minutes daily and/or when sitting, a frequency of: - throughout the day Exercise regularly Off-Loading: T Contact Cast to Left Lower Extremity otal Open toe surgical shoe to: - right foot 1. I would recommend currently that we continue with a total contact cast for the left foot I think that still appropriate at this point. 2. I am also can recommend that we continue with the PolyMem silver for the right plantar foot as we have been doing previous. 3. I would also suggest we continue with a 3 layer compression wrap which I think has been beneficial for the right lower extremity. We will see patient  back for reevaluation in 1 week here in the clinic. If anything worsens or changes patient will contact our office for additional recommendations. Electronic Signature(s) Signed: 07/01/2019 2:54:52 PM By: Lenda Kelp PA-C Entered By: Lenda Kelp on 07/01/2019 14:54:51 -------------------------------------------------------------------------------- Total Contact Cast Details Patient Name: Date of Service: [name], BEAN. 07/01/2019 1:30 PM Medical Record Number: [id number] Patient Account Number: [id number] Date of Birth/Sex: Treating RN: Oct 28, 1955 (64 y.o. [name] Primary Care Provider: MA SO UDI, [place] MA LSA DA T Other Clinician: Referring Provider: Treating Provider/Extender: Lenda Kelp MA SO UDI, [place] MA LSA DA T Weeks in Treatment: 22 T Contact Cast Applied for Wound Assessment: otal Wound #10 Left,Lateral,Plantar Foot Performed By: Physician Lenda Kelp, PA Post Procedure Diagnosis Same as Pre-procedure Electronic Signature(s) Signed: 07/01/2019  5:38:02 PM By: Zenaida Deed RN, BSN Signed: 07/01/2019 5:53:30 PM By: Lenda Kelp PA-C Entered By: Zenaida Deed on 07/01/2019 14:28:03 -------------------------------------------------------------------------------- SuperBill Details Patient Name: Date of Service: [name], [place] J. 07/01/2019 Medical Record Number: [id number] Patient Account Number: [id number] Date of Birth/Sex: Treating RN: 1955/12/18 (63 y.o. [name] Primary Care Provider: MA SO UDI, [place] MA LSA DA T Other Clinician: Referring Provider: Treating Provider/Extender: Lenda Kelp MA SO UDI, [place] MA LSA DA T Weeks in Treatment: 22 Diagnosis Coding ICD-10 Codes Code Description E11.621 Type 2 diabetes mellitus with foot ulcer L97.524 Non-pressure chronic ulcer of other part of left foot with necrosis of bone L97.511 Non-pressure chronic ulcer of other part of right foot limited to breakdown of skin L97.514  Non-pressure chronic ulcer of other part of right foot with necrosis of bone E11.42 Type 2 diabetes mellitus with diabetic polyneuropathy M14.671 Charcot's joint, right ankle and foot M14.672 Charcot's joint, left ankle and foot 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, left ankle and foot Facility Procedures CPT4 Code: [id number] Description: 11042 - DEB SUBQ TISSUE 20 SQ CM/< ICD-10 Diagnosis Description L97.524 Non-pressure chronic ulcer of other part of left foot with necrosis of bone L97.514 Non-pressure chronic ulcer of other part of right foot with necrosis of bone Modifier: Quantity: 1 Physician Procedures : CPT4 Code Description Modifier [id number] 11042 - WC PHYS SUBQ TISS 20 SQ CM ICD-10 Diagnosis Description L97.524 Non-pressure chronic ulcer of other part of left foot with necrosis of bone L97.514 Non-pressure chronic ulcer of other part of right foot  with necrosis of bone Quantity: 1 Electronic Signature(s) Signed: 07/01/2019 2:55:13 PM By: Lenda Kelp PA-C Entered By: Lenda Kelp on 07/01/2019 14:55:12

## 2019-07-03 NOTE — Progress Notes (Signed)
[name] ([id number]) Visit Report for 07/01/2019 Arrival Information Details Patient Name: Date of Service: [name], [name] 07/01/2019 1:30 PM Medical Record Number: [id number] Patient Account Number: [id number] Date of Birth/Sex: Treating RN: December 15, 1955 (64 y.o. F) Epps, Carrie Primary Care Kelvin Burpee: MA SO UDI, [place] MA LSA DA T Other Clinician: Referring Abisai Coble: Treating Hevin Jeffcoat/Extender: Lenda Kelp MA SO UDI, ELHA MA LSA DA T Weeks in Treatment: 22 Visit Information History Since Last Visit All ordered tests and consults were completed: No Patient Arrived: Ambulatory Added or deleted any medications: No Arrival Time: 13:20 Any new allergies or adverse reactions: No Accompanied By: self Had a fall or experienced change in No Transfer Assistance: None activities of daily living that may affect Patient Identification Verified: Yes risk of falls: Secondary Verification Process Completed: Yes Signs or symptoms of abuse/neglect since last visito No Patient Requires Transmission-Based Precautions: No Hospitalized since last visit: No Patient Has Alerts: No Implantable device outside of the clinic excluding No cellular tissue based products placed in the center since last visit: Has Dressing in Place as Prescribed: Yes Has Compression in Place as Prescribed: Yes Pain Present Now: No Electronic Signature(s) Signed: 07/01/2019 5:38:02 PM By: Zenaida Deed RN, BSN Entered By: Zenaida Deed on 07/01/2019 14:13:42 -------------------------------------------------------------------------------- Compression Therapy Details Patient Name: Date of Service: [name], [name] 07/01/2019 1:30 PM Medical Record Number: [id number] Patient Account Number: [id number] Date of Birth/Sex: Treating RN: 1956/01/11 (64 y.o. [name] Primary Care Rashon Westrup: MA SO UDI, [place] MA LSA DA T Other Clinician: Referring Kodi Guerrera: Treating Yuvraj Pfeifer/Extender: Lenda Kelp MA  SO UDI, ELHA MA LSA DA T Weeks in Treatment: 22 Compression Therapy Performed for Wound Assessment: Wound #9 Right,Medial Foot Performed By: Clinician [name], RN Compression Type: Three Layer Post Procedure Diagnosis Same as Pre-procedure Electronic Signature(s) Signed: 07/01/2019 5:38:02 PM By: Zenaida Deed RN, BSN Entered By: Zenaida Deed on 07/01/2019 14:28:32 -------------------------------------------------------------------------------- Encounter Discharge Information Details Patient Name: Date of Service: [name], [name] 07/01/2019 1:30 PM Medical Record Number: [id number] Patient Account Number: [id number] Date of Birth/Sex: Treating RN: 02-Jun-1955 (64 y.o. F) [name] Primary Care Keagan Anthis: MA SO UDI, [place] MA LSA DA T Other Clinician: Referring Younes Degeorge: Treating Larkyn Greenberger/Extender: Lenda Kelp MA SO UDI, ELHA MA LSA DA T Weeks in Treatment: 22 Encounter Discharge Information Items Post Procedure Vitals Discharge Condition: Stable Temperature (F): 98 Ambulatory Status: Crutches Pulse (bpm): 68 Discharge Destination: Home Respiratory Rate (breaths/min): 18 Transportation: Private Auto Blood Pressure (mmHg): 120/71 Accompanied By: self Schedule Follow-up Appointment: Yes Clinical Summary of Care: Patient Declined Electronic Signature(s) Signed: 07/01/2019 5:37:40 PM By: [name] Entered By: [name] on 07/01/2019 16:17:58 -------------------------------------------------------------------------------- Lower Extremity Assessment Details Patient Name: Date of Service: [facility], [name] 07/01/2019 1:30 PM Medical Record Number: [id number] Patient Account Number: [id number] Date of Birth/Sex: Treating RN: September 14, 1955 (65 y.o. [name] Primary Care Cordney Barstow: MA SO UDI, [place] MA LSA DA T Other Clinician: Referring Zaire Levesque: Treating Koby Hartfield/Extender: Lenda Kelp MA SO UDI, ELHA MA LSA DA T Weeks in  Treatment: 22 Edema Assessment Assessed: [Left: No] [Right: No] Edema: [Left: Yes] [Right: Yes] Calf Left: Right: Point of Measurement: 43 cm From Medial Instep 36 cm 39 cm Ankle Left: Right: Point of Measurement: 12 cm From Medial Instep 24 cm 24 cm Electronic Signature(s) Signed: 07/01/2019 5:38:02 PM By: Zenaida Deed RN, BSN Signed: 07/03/2019 9:54:21 AM By: Yevonne Pax RN Entered By: Zenaida Deed on 07/01/2019 14:14:08 -------------------------------------------------------------------------------- Multi-Disciplinary Care  Plan Details Patient Name: Date of Service: [name] 07/01/2019 1:30 PM Medical Record Number: [id number] Patient Account Number: [id number] Date of Birth/Sex: Treating RN: Feb 17, 1955 (64 y.o. [name] Primary Care Calynn Ferrero: MA SO UDI, [place] MA LSA DA T Other Clinician: Referring Parrish Bonn: Treating Anella Nakata/Extender: Lenda Kelp MA SO UDI, ELHA MA LSA DA T Weeks in Treatment: 22 Active Inactive Wound/Skin Impairment Nursing Diagnoses: Impaired tissue integrity Knowledge deficit related to ulceration/compromised skin integrity Goals: Patient/caregiver will verbalize understanding of skin care regimen Date Initiated: 01/28/2019 Target Resolution Date: 07/12/2019 Goal Status: Active Interventions: Assess patient/caregiver ability to obtain necessary supplies Assess patient/caregiver ability to perform ulcer/skin care regimen upon admission and as needed Assess ulceration(s) every visit Provide education on ulcer and skin care Notes: Electronic Signature(s) Signed: 07/01/2019 5:38:02 PM By: Zenaida Deed RN, BSN Entered By: Zenaida Deed on 07/01/2019 14:16:23 -------------------------------------------------------------------------------- Pain Assessment Details Patient Name: Date of Service: [name], [name] 07/01/2019 1:30 PM Medical Record Number: [id number] Patient Account Number: [id number] Date of  Birth/Sex: Treating RN: 12/30/1955 (64 y.o. [name] Primary Care Sanaii Caporaso: MA SO UDI, [place] MA LSA DA T Other Clinician: Referring Kaelea Gathright: Treating Carl [name]/Extender: Lenda Kelp MA SO UDI, ELHA MA LSA DA T Weeks in Treatment: 22 Active Problems Location of Pain Severity and Description of Pain Patient Has Paino No Site Locations Pain Management and Medication Current Pain Management: Electronic Signature(s) Signed: 07/01/2019 5:38:02 PM By: Zenaida Deed RN, BSN Signed: 07/03/2019 9:54:21 AM By: Yevonne Pax RN Entered By: Zenaida Deed on 07/01/2019 14:14:00 -------------------------------------------------------------------------------- Patient/Caregiver Education Details Patient Name: Date of Service: Gypsy Decant 6/14/2021andnbsp1:30 PM Medical Record Number: [id number] Patient Account Number: [id number] Date of Birth/Gender: Treating RN: 1955/09/29 (64 y.o. [name] Primary Care Physician: MA SO UDI, [place] MA LSA DA T Other Clinician: Referring Physician: Treating Physician/Extender: Lenda Kelp MA SO UDI, ELHA MA LSA DA T Weeks in Treatment: 22 Education Assessment Education Provided To: Patient Education Topics Provided Offloading: Methods: Explain/Verbal Responses: Reinforcements needed, State content correctly Wound/Skin Impairment: Methods: Explain/Verbal Responses: Reinforcements needed, State content correctly Electronic Signature(s) Signed: 07/01/2019 5:38:02 PM By: Zenaida Deed RN, BSN Entered By: Zenaida Deed on 07/01/2019 14:16:51 -------------------------------------------------------------------------------- Wound Assessment Details Patient Name: Date of Service: [name], Tranice J. 07/01/2019 1:30 PM Medical Record Number: [id number] Patient Account Number: [id number] Date of Birth/Sex: Treating RN: 1955/07/04 (64 y.o. F) Epps, Carrie Primary Care Jurnie Garritano: MA SO UDI, [place] MA LSA DA T Other  Clinician: Referring Neriyah Cercone: Treating Rasheida Broden/Extender: Lenda Kelp MA SO UDI, ELHA MA LSA DA T Weeks in Treatment: 22 Wound Status Wound Number: 10 Primary Diabetic Wound/Ulcer of the Lower Extremity Etiology: Wound Location: Left, Lateral, Plantar Foot Wound Open Wounding Event: Blister Status: Date Acquired: 11/18/2018 Comorbid Glaucoma, Chronic sinus problems/congestion, Anemia, Sleep Weeks Of Treatment: 22 History: Apnea, Hypertension, Type II Diabetes, Rheumatoid Arthritis, Clustered Wound: No Neuropathy Photos Photo Uploaded By: Benjaman Kindler on 07/02/2019 15:39:52 Wound Measurements Length: (cm) 1 Width: (cm) 1.1 Depth: (cm) 0.3 Area: (cm) 0.864 Volume: (cm) 0.259 % Reduction in Area: 50.9% % Reduction in Volume: 90.2% Epithelialization: Medium (34-66%) Tunneling: No Undermining: No Wound Description Classification: Grade 2 Wound Margin: Well defined, not attached Exudate Amount: Medium Exudate Type: Serosanguineous Exudate Color: red, brown Foul Odor After Cleansing: No Slough/Fibrino Yes Wound Bed Granulation Amount: Large (67-100%) Exposed Structure Granulation Quality: Red Fascia Exposed: No Necrotic Amount: Small (1-33%) Fat Layer (Subcutaneous Tissue) Exposed: Yes Necrotic Quality: Adherent Slough Tendon Exposed:  No Muscle Exposed: No Joint Exposed: No Bone Exposed: No Treatment Notes Wound #10 (Left, Lateral, Plantar Foot) 1. Cleanse With Wound Cleanser Soap and water 3. Primary Dressing Applied Calcium Alginate Ag 4. Secondary Dressing ABD Pad Dry Gauze Foam Drawtex 7. Footwear/Offloading device applied T Contact Cast otal Notes TCC applied by MD. Electronic Signature(s) Signed: 07/01/2019 5:38:02 PM By: Baruch Gouty RN, BSN Signed: 07/03/2019 9:54:21 AM By: Carlene Coria RN Entered By: Baruch Gouty on 07/01/2019 14:14:46 -------------------------------------------------------------------------------- Wound Assessment  Details Patient Name: Date of Service: [name], Joliana J. 07/01/2019 1:30 PM Medical Record Number: [id number] Patient Account Number: [id number] Date of Birth/Sex: Treating RN: 08-06-1955 (64 y.o. F) Epps, [place] Primary Care Jesslyn Viglione: Other Clinician: MA SO UDI, [place] MA LSA DA T Referring Shalanda Brogden: Treating Eragon Hammond/Extender: Worthy Keeler MA SO UDI, ELHA MA LSA DA T Weeks in Treatment: 22 Wound Status Wound Number: 9 Primary Diabetic Wound/Ulcer of the Lower Extremity Etiology: Wound Location: Right, Medial Foot Wound Open Wounding Event: Blister Status: Date Acquired: 06/18/2018 Comorbid Glaucoma, Chronic sinus problems/congestion, Anemia, Sleep Weeks Of Treatment: 22 History: Apnea, Hypertension, Type II Diabetes, Rheumatoid Arthritis, Clustered Wound: No Neuropathy Photos Photo Uploaded By: Mikeal Hawthorne on 07/02/2019 15:39:53 Wound Measurements Length: (cm) 2.5 % Width: (cm) 2.2 % Depth: (cm) 0.3 Ep Area: (cm) 4.32 Volume: (cm) 1.296 Reduction in Area: 60.7% Reduction in Volume: 86.9% ithelialization: Small (1-33%) Wound Description Classification: Grade 2 Fo Wound Margin: Well defined, not attached Sl Exudate Amount: Medium Exudate Type: Serosanguineous Exudate Color: red, brown ul Odor After Cleansing: No ough/Fibrino No Wound Bed Granulation Amount: Large (67-100%) Exposed Structure Granulation Quality: Red, Pink Fascia Exposed: No Necrotic Amount: None Present (0%) Fat Layer (Subcutaneous Tissue) Exposed: Yes Tendon Exposed: No Muscle Exposed: No Joint Exposed: No Bone Exposed: No Treatment Notes Wound #9 (Right, Medial Foot) 1. Cleanse With Wound Cleanser Soap and water 3. Primary Dressing Applied Polymem Ag 4. Secondary Dressing ABD Pad Dry Gauze Kerramax/Xtrasorb 6. Support Layer Applied 3 layer compression wrap Notes stockinette. Electronic Signature(s) Signed: 07/01/2019 5:38:02 PM By: Baruch Gouty RN, BSN Signed: 07/03/2019  9:54:21 AM By: Carlene Coria RN Signed: 07/03/2019 9:54:21 AM By: Carlene Coria RN Entered By: Baruch Gouty on 07/01/2019 14:15:11 -------------------------------------------------------------------------------- Vitals Details Patient Name: Date of Service: [facility], Jadee J. 07/01/2019 1:30 PM Medical Record Number: [id number] Patient Account Number: [id number] Date of Birth/Sex: Treating RN: 1955/02/06 (63 y.o. F) Epps, [place] Primary Care Kemari Mares: MA SO UDI, [place] MA LSA DA T Other Clinician: Referring Melody Cirrincione: Treating Kabria Hetzer/Extender: Worthy Keeler MA SO UDI, ELHA MA LSA DA T Weeks in Treatment: 22 Vital Signs Time Taken: 13:44 Temperature (F): 98 Height (in): 68 Pulse (bpm): 68 Weight (lbs): 265 Respiratory Rate (breaths/min): 18 Body Mass Index (BMI): 40.3 Blood Pressure (mmHg): 120/71 Reference Range: 80 - 120 mg / dl Electronic Signature(s) Signed: 07/01/2019 5:38:02 PM By: Baruch Gouty RN, BSN Entered By: Baruch Gouty on 07/01/2019 14:13:51

## 2019-07-08 ENCOUNTER — Encounter (HOSPITAL_BASED_OUTPATIENT_CLINIC_OR_DEPARTMENT_OTHER): Payer: Medicare Other | Admitting: Internal Medicine

## 2019-07-08 DIAGNOSIS — E11621 Type 2 diabetes mellitus with foot ulcer: Secondary | ICD-10-CM | POA: Diagnosis not present

## 2019-07-09 NOTE — Progress Notes (Signed)
LINLEE, CROMIE ([id number]) Visit Report for 07/08/2019 HPI Details Patient Name: Date of Service: [name], [name] 07/08/2019 1:30 PM Medical Record Number: [id number] Patient Account Number: [id number] Date of Birth/Sex: Treating RN: February 26, 1955 (64 y.o. [name] Primary Care Provider: MA SO UDI, [place] MA LSA DA T Other Clinician: Referring Provider: Treating Provider/Extender: Baltazar Najjar MA SO UDI, ELHA MA LSA DA T Weeks in Treatment: 19 History of Present Illness HPI Description: 64 yrs old bf here for follow up recurrent left charcot foot ulcer. She has DM. She is not a smoker. She states a blister developed 3 months ago at site of previous breakdown from 1 year ago. It was debrided at the podiatrist's office. She went to the ER and then sent here. She denies pain. Xray was negative for osteo. Culture from this clinic negative. 09/08/14 [name] to [name] for Charcot foot. Seen by him and MRI scheduled 09/19/14. Prior silver collagen but this was not ordered last visit, patient has been rinsing with saline alone. Re institute silver collagen every other day. F/u 2 weeks with [name] as Labor Day holiday. Supplies ordered. 09/25/14; this is a patient with a Charcot foot on the left. she has a wound over the plantar aspect of her left calcaneus. She has been seen by [name] of orthopedic surgery and has had an MRI. She is apparently going within the next week or 2 for corrective surgery which will involve an external fixator. I don't have any of the details of this. 10/06/14; The patient is a 64 yrs old bf with a left Charcot foot and ulcer on the plantar. 12/01/14 Returns post surgery with [name]. Has follow up visit later this week with him, currently in facility and NWB over this extremity. States no drainage from foot and no current wound care. On exam callus and scab in place, suspect healed. Has external fixator in place. Will defer to Dr.  [name] for management, ok to place moisturizing cream over scabs and callus and she will call if she requires follow up here. READMISSION 01/28/2019 Patient was in the clinic in 2016 for a wound on the plantar aspect of her left calcaneus. Predominantly cared for by [name] she was referred to [name] and had corrective surgery for the position of her left ankle I believe. She had previously been here in 2015 and then prior to that in 2011 2012 that I do not have these records. More recently she has developed fairly extensive wounds on the right medial foot, left lateral foot and a small area on the right medial great toe. Right medial foot wound has been there for 6 to 7 months, left foot wound for 2 to 3 months and the right great toe only over the last few weeks. She has been followed by [name] at Dr. Laverta [place] office it sounds as though she has been undergoing callus paring and silver nitrate. The patient has been washing these off with soap and water and plan applying dry dressing Past medical history, type 2 diabetes well controlled with a recent hemoglobin A1c of 7 on 11/16, type 2 diabetes with peripheral neuropathy, previous left Charcot joint surgery bilateral Charcot joints currently, stage III chronic renal failure, hypertension, obstructive sleep apnea, history of MRSA and gastroesophageal reflux. 1/18; this is a patient with bilateral Charcot feet. Plain x-rays that I did of both of these wounds that are either at bone or precariously close to bone  were done. On the right foot this showed possible lytic destruction involving the anterior aspect of the talus which may represent a wound osteomyelitis. An MRI was recommended. On the left there was no definite lytic destruction although the wound is deep undermining's and I think probably requires an MRI on this basis in any case 1/25; patient was supposed to have her MRI last Friday of her bilateral feet however they would  not do it because there was silver alginate in her dressings, will replace with calcium alginate today and will see if we can get her done today 2/1; patient did not get her MRIs done last week because of issues with the MRI department receiving orders to do bilateral feet. She has 2 deep wounds in the setting of Charcot feet deformities bilaterally. Both of them at one point had exposed bone although I had trouble demonstrating that today. I am not sure that we can offload these very aggressively as I watched her leave the clinic last week her gait is very narrow and unsteady. 2/15; the MRI of her right foot did not show osteomyelitis potentially osteonecrosis of the talus. 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 The wounds are a large wound on the right medial foot and on the left a substantial wound on the left lateral plantar calcaneus. We have been using silver alginate. Ultimately I think we need to consider a total contact cast on the right while we work through the possibility of osteomyelitis in the left. I watched her walk out with her crutches I have some trepidation about the ability to walk with the cast on her right foot. Nevertheless with her Charcot deformities I do not think there is a way to heal these short of offloading them aggressively. 2/22; the culture of the bone in the left foot that I did last week showed a few Citrobacter Koseri as well as some streptococcal species. In my attempted bone for pathology did not actually yield a lot of bone the pathologist did not identify any osteomyelitis. 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 Is promised today and aggressive debridement of the right foot and a total contact cast which surprisingly she seemed to tolerate quite well 03/13/2019  upon evaluation today patient appears to be doing well with her total contact cast she is here for the first cast change. She had no areas of rubbing or any other complications at this point. Overall things are doing quite well. 2/26; the patient was kindly seen by Dr. Orvan Falconer of infectious disease on 2/25. She is now on IV vancomycin PICC line placement in 2 days and oral Flagyl. This was in response to her MRI showing osteomyelitis of the left calcaneus concerning for osteomyelitis. We have been putting her right leg in a total contact cast. Our nurses report drainage. She is tolerating the total contact cast well. 3/4; the patient is started her IV antibiotics and is taking IV vancomycin and oral Flagyl. She appears to be tolerating these both well. This is for osteomyelitis involving the left calcaneus. The area on the right in the setting of bilateral Charcot deformities did not have any bone infection on MRI. We put a total contact cast on her with silver alginate as the primary dressing and she returns today in follow-up. Per  our intake nurse there was too much drainage to leave this on all week therefore we will bring her back for an early change 3/8; follow-up total contact cast she is still having a lot of drainage probably too much drainage from the right foot wound will week with the same cast. She will be back on Thursday. The area on the left looks somewhat better 3/11; patient here for a total contact cast change. 3/15; patient came in for wound care evaluation. She is still on IV vancomycin and oral Flagyl for osteomyelitis in the left foot. The area on the right foot we are putting in a total contact cast. 3/18 for total contact cast change on the right foot 3/22; the patient was here for wound review today. The area on the right foot is slightly smaller in terms of surface area. However the surface of the wound is very gritty and hyper granulated. More problematically the area on  the plantar left foot has increased depth indeed in the center of this it probes to bone. We have been using Hydrofera Blue in both areas. She is on antibiotics as prescribed by infectious disease IV vancomycin and oral Flagyl 3/25; the patient is in for total contact cast change. Our intake nurse was concerned about the amount of drainage which soaked right to the multiple layers we are putting on this. Wondered about options. The wound bed actually looks as healthy as this is looked although there may not be a lot of change in dimensions things are looking a lot better. If we are going to heal this woman's wound which is in the middle of her right Charcot foot this is going to need to be offloaded. There are not many alternatives 3/29; still not a lot of improvement although the surface of the right wound looks better. We have been using Hydrofera Blue 04/17/2019 upon evaluation today patient appears to be doing well with a total contact cast. With actually having to change this twice a week. She saw [name] for wound care earlier in the week this is for the cast change today. That is due to the fact that she has significant drainage. Overall the wound is been looking excellent however. 4/5; we continue to put a total contact cast on this patient with Hydrofera Blue. Still a lot of drainage which precludes a simple weekly change or changing this twice a week. 4/8; total contact cast changed today. Apparently the wound looks better per our intake nurse 4/12; patient here for wound care evaluation. Wounds on the right foot have not changed in dimension none about a month left foot looks similar. Apparently her antibiotics that we are giving her for osteomyelitis in the left foot complete on Wednesday. We have been using Hydrofera Blue on the right foot under a total contact cast. Nurses report greenish drainage although I think this may be discoloration from the [facility] 4/15; back for cast  change wounds look quite good although still moderate amount of drainage. T contact cast reapplied otal 4/19;. Wound evaluation. Wound on the right foot is smaller surface looks healthy. There is still the divot in the middle part of this wound but I could not feel any bone. Area on the left foot also looks better She has completed her antibiotics but still has the PICC line in. 4/23; patient comes back for a total contact cast change this was done in the standard fashion no issues 4/26; wound measures slightly larger on the right  foot which is disappointing. She sees Dr. Orvan Falconer with regards to her antibiotics on Wednesday. I believe she is on vancomycin IV and oral Flagyl. 4/29; in for her routine total contact cast change. She saw Dr. Orvan Falconer this morning. He is asking about antibiotic continuation I will be in touch with him. I did not look at her wound today 5/3; patient saw Dr. Orvan Falconer on 4/28. I did send him a note asking about the continuation of perhaps an oral regimen. I have not heard back. She still has an elevated sedimentation rate at 81. Her wounds are smaller. 5/6; in for her total contact cast change. We did not look at the wound today. She has seen Dr. Orvan Falconer and is now on oral Keflex and Flagyl. She seems to be tolerating these well 5/10; she had her PICC line removed. The wound on the right foot after some initial improvement has not made any improvement even with the total contact cast. Still having a lot of drainage. Likewise the area on the left foot really is not any better either 5/14; in for a cast change today. We will look at the right foot on Monday. If this is not making any progress I am going to try to cast the left foot. 5/17; the wound on the right foot is absolutely no different. I am going to put ointment on this area and change the cast of the left foot. We have been using silver alginate there 5/21; apparently the area on the right foot was better per our  intake nurse although I did not see it. We did a standard total contact cast change on the left foot we have been applying silver alginate to the wound here. 06/10/19-Area on the right foot per dimensions slightly better, the left foot has still some depth applying silver alginate to the wound with TCC 06/13/19-Patient here for Casting to Left foot 6/1; patient had the total contact cast were placed on the left foot. Noted some swelling in her right leg although the dimensions of the wound on the right foot are better. We have been using PolyMem on the right and silver alginate on the left under the cast 6/7; we continue to make nice progress in both her plantar feet wound. We are putting the total contact cast on the left. Surprisingly in spite of this the area on the right medial foot in the middle of her Charcot deformities actually is making progress as well 07/01/2019 upon evaluation today patient actually appears to be doing quite well with regard to her wounds on the feet compared to when I saw her last that has been quite some time. With that being said I do believe the total contact cast has been beneficial in this left foot and she has a lot of signs of improvement she does have some callus overhanging which is caused a little bit of a ledge and an issue here. I do believe that we may be able to do something to help in that regard. Fortunately otherwise the wound seems to be progressing quite nicely. 6/21; the patient's area on the left foot that we are currently casting is just about closed. The area on the right medial foot has a healthy looking surface but may be not changed as much from last week in terms of surface area. We are using silver alginate on the right and polymen Ag under the cast Electronic Signature(s) Signed: 07/08/2019 6:03:41 PM By: Baltazar Najjar MD Entered By: Baltazar Najjar  on 07/08/2019  15:06:03 -------------------------------------------------------------------------------- Physical Exam Details Patient Name: Date of Service: CARENA, STREAM 07/08/2019 1:30 PM Medical Record Number: [id number] Patient Account Number: [id number] Date of Birth/Sex: Treating RN: [birth year] (64 y.o. [name] Primary Care Provider: MA SO UDI, [place] MA LSA DA T Other Clinician: Referring Provider: Treating Provider/Extender: Baltazar Najjar MA SO UDI, ELHA MA LSA DA T Weeks in Treatment: 23 Constitutional Sitting or standing Blood Pressure is within target range for patient.. Pulse regular and within target range for patient.Marland Kitchen Respirations regular, non-labored and within target range.. Temperature is normal and within the target range for the patient.Marland Kitchen Appears in no distress. Notes Wound exam; on the left foot her wound is just about closed. This is the area that had the initial underlying osteomyelitis. The area on the right foot did not respond well to the total contact casting in fact seem to do better after we allowed her to change the dressing although it is not changed much in terms of surface area of the tissue really looks quite healthy Electronic Signature(s) Signed: 07/08/2019 6:03:41 PM By: Baltazar Najjar MD Entered By: Baltazar Najjar on 07/08/2019 15:08:36 -------------------------------------------------------------------------------- Physician Orders Details Patient Name: Date of Service: [name], [name] 07/08/2019 1:30 PM Medical Record Number: [id number] Patient Account Number: [id number] Date of Birth/Sex: Treating RN: 1955-10-24 (64 y.o. [name] Primary Care Provider: MA SO UDI, [place] MA LSA DA T Other Clinician: Referring Provider: Treating Provider/Extender: Baltazar Najjar MA SO UDI, ELHA MA LSA DA T Weeks in Treatment: 72 Verbal / Phone Orders: No Diagnosis Coding ICD-10 Coding Code Description E11.621 Type 2 diabetes mellitus with foot  ulcer L97.524 Non-pressure chronic ulcer of other part of left foot with necrosis of bone L97.511 Non-pressure chronic ulcer of other part of right foot limited to breakdown of skin L97.514 Non-pressure chronic ulcer of other part of right foot with necrosis of bone E11.42 Type 2 diabetes mellitus with diabetic polyneuropathy M14.671 Charcot's joint, right ankle and foot M14.672 Charcot's joint, left ankle and foot 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 Dressing Change Frequency Wound #10 Left,Lateral,Plantar Foot Do not change entire dressing for one week. Wound #9 Right,Medial Foot Do not change entire dressing for one week. Wound Cleansing May shower with protection. Primary Wound Dressing Wound #10 Left,Lateral,Plantar Foot Calcium Alginate with Silver Wound #9 Right,Medial Foot Polymem Silver Secondary Dressing Wound #10 Left,Lateral,Plantar Foot Foam - donut Kerlix/Rolled Gauze Dry Gauze Drawtex - cut to fit inside wound edges Wound #9 Right,Medial Foot Dry Gauze ABD pad Zetuvit or Kerramax Edema Control 3 Layer Compression System - Right Lower Extremity Avoid standing for long periods of time Elevate legs to the level of the heart or above for 30 minutes daily and/or when sitting, a frequency of: - throughout the day Exercise regularly Off-Loading Total Contact Cast to Left Lower Extremity Open toe surgical shoe to: - right foot Electronic Signature(s) Signed: 07/08/2019 6:03:41 PM By: Baltazar Najjar MD Signed: 07/09/2019 5:24:50 PM By: Zandra Abts RN, BSN Entered By: Zandra Abts on 07/08/2019 15:03:41 -------------------------------------------------------------------------------- Problem List Details Patient Name: Date of Service: [name], [name] 07/08/2019 1:30 PM Medical Record Number: [id number] Patient Account Number: [id number] Date of Birth/Sex: Treating RN: 01/17/1956 (64  y.o. [name] Primary Care Provider: MA SO UDI, [place]ene [place] MA LSA DA T Other Clinician: Referring Provider: Treating Provider/Extender: Baltazar Najjar MA SO UDI, ELHA MA LSA DA T Weeks in Treatment: 55 Active  Problems ICD-10 Encounter Code Description Active Date MDM Diagnosis E11.621 Type 2 diabetes mellitus with foot ulcer 01/28/2019 No Yes L97.524 Non-pressure chronic ulcer of other part of left foot with necrosis of bone 01/28/2019 No Yes L97.511 Non-pressure chronic ulcer of other part of right foot limited to breakdown of 01/28/2019 No Yes skin L97.514 Non-pressure chronic ulcer of other part of right foot with necrosis of bone 01/28/2019 No Yes E11.42 Type 2 diabetes mellitus with diabetic polyneuropathy 01/28/2019 No Yes M14.671 Charcot's joint, right ankle and foot 01/28/2019 No Yes M14.672 Charcot's joint, left ankle and foot 01/28/2019 No Yes 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 07/08/2019 1:30 PM Medical Record Number: [id number] Patient Account Number: [id number] Date of Birth/Sex: Treating RN: 23-Mar-1955 (64 y.o. [name] Primary Care Provider: MA SO UDI, [place] MA LSA DA T Other Clinician: Referring Provider: Treating Provider/Extender: Baltazar Najjar MA SO UDI, ELHA MA LSA DA T Weeks in Treatment: 23 Subjective History of Present Illness (HPI) 64 yrs old bf here for follow up recurrent left charcot foot ulcer. She has DM. She is not a smoker. She states a blister developed 3 months ago at site of previous breakdown from 1 year ago. It was debrided at the podiatrist's office. She went to the ER and then sent  here. She denies pain. Xray was negative for osteo. Culture from this clinic negative. 09/08/14 Referred by [name] to [name] for Charcot foot. Seen by him and MRI scheduled 09/19/14. Prior silver collagen but this was not ordered last visit, patient has been rinsing with saline alone. Re institute silver collagen every other day. F/u 2 weeks with [name] as Labor Day holiday. Supplies ordered. 09/25/14; this is a patient with a Charcot foot on the left. she has a wound over the plantar aspect of her left calcaneus. She has been seen by [name] of orthopedic surgery and has had an MRI. She is apparently going within the next week or 2 for corrective surgery which will involve an external fixator. I don't have any of the details of this. 10/06/14; The patient is a 64 yrs old bf with a left Charcot foot and ulcer on the plantar. 12/01/14 Returns post surgery with [name]. Has follow up visit later this week with him, currently in facility and NWB over this extremity. States no drainage from foot and no current wound care. On exam callus and scab in place, suspect healed. Has external fixator in place. Will defer to [name] for management, ok to place moisturizing cream over scabs and callus and she will call if she requires follow up here. READMISSION 01/28/2019 Patient was in the clinic in 2016 for a wound on the plantar aspect of her left calcaneus. Predominantly cared for by [name] she was referred to [name] and had corrective surgery for the position of her left ankle I believe. She had previously been here in 2015 and then prior to that in 2011 2012 that I do not have these records. More recently she has developed fairly extensive wounds on the right medial foot, left lateral foot and a small area on the right medial great toe. Right medial foot wound has been there for 6 to 7 months, left foot wound for 2 to  3 months and the right great toe only over the last few  weeks. She has been followed by [name] at Dr. Laverta [place] office it sounds as though she has been undergoing callus paring and silver nitrate. The patient has been washing these off with soap and water and plan applying dry dressing Past medical history, type 2 diabetes well controlled with a recent hemoglobin A1c of 7 on 11/16, type 2 diabetes with peripheral neuropathy, previous left Charcot joint surgery bilateral Charcot joints currently, stage III chronic renal failure, hypertension, obstructive sleep apnea, history of MRSA and gastroesophageal reflux. 1/18; this is a patient with bilateral Charcot feet. Plain x-rays that I did of both of these wounds that are either at bone or precariously close to bone were done. On the right foot this showed possible lytic destruction involving the anterior aspect of the talus which may represent a wound osteomyelitis. An MRI was recommended. On the left there was no definite lytic destruction although the wound is deep undermining's and I think probably requires an MRI on this basis in any case 1/25; patient was supposed to have her MRI last Friday of her bilateral feet however they would not do it because there was silver alginate in her dressings, will replace with calcium alginate today and will see if we can get her done today 2/1; patient did not get her MRIs done last week because of issues with the MRI department receiving orders to do bilateral feet. She has 2 deep wounds in the setting of Charcot feet deformities bilaterally. Both of them at one point had exposed bone although I had trouble demonstrating that today. I am not sure that we can offload these very aggressively as I watched her leave the clinic last week her gait is very narrow and unsteady. 2/15; the MRI of her right foot did not show osteomyelitis potentially osteonecrosis of the talus. 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 The  wounds are a large wound on the right medial foot and on the left a substantial wound on the left lateral plantar calcaneus. We have been using silver alginate. Ultimately I think we need to consider a total contact cast on the right while we work through the possibility of osteomyelitis in the left. I watched her walk out with her crutches I have some trepidation about the ability to walk with the cast on her right foot. Nevertheless with her Charcot deformities I do not think there is a way to heal these short of offloading them aggressively. 2/22; the culture of the bone in the left foot that I did last week showed a few Citrobacter Koseri as well as some streptococcal species. In my attempted bone for pathology did not actually yield a lot of bone the pathologist did not identify any osteomyelitis. 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 Is promised today and aggressive debridement of the right foot and a total contact cast which surprisingly she seemed to tolerate quite well 03/13/2019 upon evaluation today patient appears to be doing well with her total contact cast she is here for the first cast change. She had no areas of rubbing or any other complications at this point. Overall things are doing quite well. 2/26; the patient was kindly seen by Dr. Orvan Falconer of infectious disease on 2/25.  She is now on IV vancomycin PICC line placement in 2 days and oral Flagyl. This was in response to her MRI showing osteomyelitis of the left calcaneus concerning for osteomyelitis. We have been putting her right leg in a total contact cast. Our nurses report drainage. She is tolerating the total contact cast well. 3/4; the patient is started her IV antibiotics and is taking IV vancomycin and oral Flagyl. She appears to be tolerating these both well. This is for  osteomyelitis involving the left calcaneus. The area on the right in the setting of bilateral Charcot deformities did not have any bone infection on MRI. We put a total contact cast on her with silver alginate as the primary dressing and she returns today in follow-up. Per our intake nurse there was too much drainage to leave this on all week therefore we will bring her back for an early change 3/8; follow-up total contact cast she is still having a lot of drainage probably too much drainage from the right foot wound will week with the same cast. She will be back on Thursday. The area on the left looks somewhat better 3/11; patient here for a total contact cast change. 3/15; patient came in for wound care evaluation. She is still on IV vancomycin and oral Flagyl for osteomyelitis in the left foot. The area on the right foot we are putting in a total contact cast. 3/18 for total contact cast change on the right foot 3/22; the patient was here for wound review today. The area on the right foot is slightly smaller in terms of surface area. However the surface of the wound is very gritty and hyper granulated. More problematically the area on the plantar left foot has increased depth indeed in the center of this it probes to bone. We have been using Hydrofera Blue in both areas. She is on antibiotics as prescribed by infectious disease IV vancomycin and oral Flagyl 3/25; the patient is in for total contact cast change. Our intake nurse was concerned about the amount of drainage which soaked right to the multiple layers we are putting on this. Wondered about options. The wound bed actually looks as healthy as this is looked although there may not be a lot of change in dimensions things are looking a lot better. If we are going to heal this woman's wound which is in the middle of her right Charcot foot this is going to need to be offloaded. There are not many alternatives 3/29; still not a lot of  improvement although the surface of the right wound looks better. We have been using Hydrofera Blue 04/17/2019 upon evaluation today patient appears to be doing well with a total contact cast. With actually having to change this twice a week. She saw Dr. Dellia Nims for wound care earlier in the week this is for the cast change today. That is due to the fact that she has significant drainage. Overall the wound is been looking excellent however. 4/5; we continue to put a total contact cast on this patient with Hydrofera Blue. Still a lot of drainage which precludes a simple weekly change or changing this twice a week. 4/8; total contact cast changed today. Apparently the wound looks better per our intake nurse 4/12; patient here for wound care evaluation. Wounds on the right foot have not changed in dimension none about a month left foot looks similar. Apparently her antibiotics that we are giving her for osteomyelitis in the left foot complete on Wednesday.  We have been using Hydrofera Blue on the right foot under a total contact cast. Nurses report greenish drainage although I think this may be discoloration from the [facility] 4/15; back for cast change wounds look quite good although still moderate amount of drainage. T contact cast reapplied otal 4/19;. Wound evaluation. Wound on the right foot is smaller surface looks healthy. There is still the divot in the middle part of this wound but I could not feel any bone. Area on the left foot also looks better She has completed her antibiotics but still has the PICC line in. 4/23; patient comes back for a total contact cast change this was done in the standard fashion no issues 4/26; wound measures slightly larger on the right foot which is disappointing. She sees Dr. Orvan Falconer with regards to her antibiotics on Wednesday. I believe she is on vancomycin IV and oral Flagyl. 4/29; in for her routine total contact cast change. She saw Dr. Orvan Falconer this  morning. He is asking about antibiotic continuation I will be in touch with him. I did not look at her wound today 5/3; patient saw Dr. Orvan Falconer on 4/28. I did send him a note asking about the continuation of perhaps an oral regimen. I have not heard back. She still has an elevated sedimentation rate at 81. Her wounds are smaller. 5/6; in for her total contact cast change. We did not look at the wound today. She has seen Dr. Orvan Falconer and is now on oral Keflex and Flagyl. She seems to be tolerating these well 5/10; she had her PICC line removed. The wound on the right foot after some initial improvement has not made any improvement even with the total contact cast. Still having a lot of drainage. Likewise the area on the left foot really is not any better either 5/14; in for a cast change today. We will look at the right foot on Monday. If this is not making any progress I am going to try to cast the left foot. 5/17; the wound on the right foot is absolutely no different. I am going to put ointment on this area and change the cast of the left foot. We have been using silver alginate there 5/21; apparently the area on the right foot was better per our intake nurse although I did not see it. We did a standard total contact cast change on the left foot we have been applying silver alginate to the wound here. 06/10/19-Area on the right foot per dimensions slightly better, the left foot has still some depth applying silver alginate to the wound with TCC 06/13/19-Patient here for Casting to Left foot 6/1; patient had the total contact cast were placed on the left foot. Noted some swelling in her right leg although the dimensions of the wound on the right foot are better. We have been using PolyMem on the right and silver alginate on the left under the cast 6/7; we continue to make nice progress in both her plantar feet wound. We are putting the total contact cast on the left. Surprisingly in spite of this  the area on the right medial foot in the middle of her Charcot deformities actually is making progress as well 07/01/2019 upon evaluation today patient actually appears to be doing quite well with regard to her wounds on the feet compared to when I saw her last that has been quite some time. With that being said I do believe the total contact cast has been beneficial  in this left foot and she has a lot of signs of improvement she does have some callus overhanging which is caused a little bit of a ledge and an issue here. I do believe that we may be able to do something to help in that regard. Fortunately otherwise the wound seems to be progressing quite nicely. 6/21; the patient's area on the left foot that we are currently casting is just about closed. The area on the right medial foot has a healthy looking surface but may be not changed as much from last week in terms of surface area. We are using silver alginate on the right and polymen Ag under the cast Objective Constitutional Sitting or standing Blood Pressure is within target range for patient.. Pulse regular and within target range for patient.Marland Kitchen Respirations regular, non-labored and within target range.. Temperature is normal and within the target range for the patient.Marland Kitchen Appears in no distress. Vitals Time Taken: 2:15 PM, Height: 68 in, Weight: 265 lbs, BMI: 40.3, Temperature: 97.9 F, Pulse: 83 bpm, Respiratory Rate: 18 breaths/min, Blood Pressure: 118/76 mmHg. General Notes: Wound exam; on the left foot her wound is just about closed. This is the area that had the initial underlying osteomyelitis. ooThe area on the right foot did not respond well to the total contact casting in fact seem to do better after we allowed her to change the dressing although it is not changed much in terms of surface area of the tissue really looks quite healthy Integumentary (Hair, Skin) Wound #10 status is Open. Original cause of wound was Blister. The wound  is located on the Left,Lateral,Plantar Foot. The wound measures 0.5cm length x 0.5cm width x 0.1cm depth; 0.196cm^2 area and 0.02cm^3 volume. There is Fat Layer (Subcutaneous Tissue) Exposed exposed. There is no tunneling or undermining noted. There is a medium amount of serosanguineous drainage noted. The wound margin is distinct with the outline attached to the wound base. There is large (67-100%) red, pink granulation within the wound bed. There is no necrotic tissue within the wound bed. Wound #9 status is Open. Original cause of wound was Blister. The wound is located on the Right,Medial Foot. The wound measures 2.4cm length x 2.4cm width x 0.7cm depth; 4.524cm^2 area and 3.167cm^3 volume. There is Fat Layer (Subcutaneous Tissue) Exposed exposed. There is no tunneling noted, however, there is undermining starting at 9:00 and ending at 1:00 with a maximum distance of 0.5cm. There is a medium amount of serosanguineous drainage noted. The wound margin is well defined and not attached to the wound base. There is large (67-100%) red, pink granulation within the wound bed. There is no necrotic tissue within the wound bed. 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 There was a Three Layer Compression Therapy Procedure by Zandra Abts, RN. Post procedure Diagnosis Wound #9: Same as Pre-Procedure Wound  #10 Pre-procedure diagnosis of Wound #10 is a Diabetic Wound/Ulcer of the Lower Extremity located on the Left,Lateral,Plantar Foot .  There was a T Contact otal Cast Procedure by [name], MD. Post procedure Diagnosis Wound #10: Same as Pre-Procedure Plan Follow-up Appointments: Return Appointment in 1 week. Dressing Change Frequency: Wound #10 Left,Lateral,Plantar Foot: Do not change entire dressing for one week. Wound #9 Right,Medial Foot: Do not change entire dressing for one week. Wound Cleansing: May shower with protection. Primary Wound Dressing: Wound #10 Left,Lateral,Plantar Foot: Calcium Alginate with Silver Wound #9 Right,Medial Foot: Polymem Silver Secondary Dressing: Wound #10 Left,Lateral,Plantar Foot: Foam - donut Kerlix/Rolled Gauze Dry Gauze Drawtex - cut to fit inside wound edges Wound #9 Right,Medial Foot: Dry Gauze ABD pad Zetuvit or Kerramax Edema Control: 3 Layer Compression System - Right Lower Extremity Avoid standing for long periods of time Elevate legs to the level of the heart or above for 30 minutes daily and/or when sitting, a frequency of: - throughout the day Exercise regularly Off-Loading: T Contact Cast to Left Lower Extremity otal Open toe surgical shoe to: - right foot I did not change the primary dressings to either area Continue with total contact cast on the left although this may be closed by next week. I have asked her to bring issue that has a padded insole to help prevent breakdown in the left foot Electronic Signature(s) Signed: 07/08/2019 6:03:41 PM By: Baltazar Najjar MD Entered By: Baltazar Najjar on 07/08/2019 15:11:06 -------------------------------------------------------------------------------- Total Contact Cast Details Patient Name: Date of Service: [name], [name] 07/08/2019 1:30 PM Medical Record Number: [id number] Patient Account Number: [id number] Date of Birth/Sex: Treating RN: 1955/11/17 (64 y.o.  [name] Primary Care Provider: MA SO UDI, Judene Companion MA LSA DA T Other Clinician: Referring Provider: Treating Provider/Extender: Baltazar Najjar MA SO UDI, ELHA MA LSA DA T Weeks in Treatment: 23 T Contact Cast Applied for Wound Assessment: otal Wound #10 Left,Lateral,Plantar Foot Performed By: Physician [name], MD Post Procedure Diagnosis Same as Pre-procedure Electronic Signature(s) Signed: 07/08/2019 6:03:41 PM By: Baltazar Najjar MD Entered By: Baltazar Najjar on 07/08/2019 15:45:45 -------------------------------------------------------------------------------- SuperBill Details Patient Name: Date of Service: [name], [name] 07/08/2019 Medical Record Number: [id number] Patient Account Number: [id number] Date of Birth/Sex: Treating RN: February 12, 1955 (64 y.o. [name] Primary Care Provider: MA SO UDI, [place] MA LSA DA T Other Clinician: Referring Provider: Treating Provider/Extender: Abe People, ELHA MA LSA DA T Weeks in Treatment: 23 Diagnosis Coding ICD-10 Codes Code Description E11.621 Type 2 diabetes mellitus with foot ulcer L97.524 Non-pressure chronic ulcer of other part of left foot with necrosis of bone L97.511 Non-pressure chronic ulcer of other part of right foot limited to breakdown of skin L97.514 Non-pressure chronic ulcer of other part of right foot with necrosis of bone E11.42 Type 2 diabetes mellitus with diabetic polyneuropathy M14.671 Charcot's joint, right ankle and foot M14.672 Charcot's joint, left ankle and foot 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, left ankle and foot Facility Procedures CPT4 Code: [id number] ( Description: [id number] - APPLY TOTAL CONTACT LEG CAST ICD-10 Diagnosis Description L97.524 Non-pressure chronic ulcer of other part of left foot with necrosis of bone Facility Use Only) [id number] - APPLY MULTLAY COMPRS LWR RT LEG 59 Modifier: 1 Quantity: 1 Physician Procedures : CPT4 Code Description  Modifier [id number] - WC PHYS APPLY TOTAL CONTACT CAST ICD-10 Diagnosis Description L97.524 Non-pressure chronic ulcer of other part of left foot with necrosis of bone Quantity: 1 Electronic Signature(s) Signed: 07/09/2019 5:20:04 PM By: Baltazar Najjar MD Signed: 07/09/2019 5:24:50 PM By: Zandra Abts RN, BSN Previous Signature: 07/08/2019  6:03:41 PM Version By: Baltazar Najjar MD Entered By: Zandra Abts on 07/08/2019 18:58:11

## 2019-07-09 NOTE — Progress Notes (Addendum)
ELLYCE, LAFEVERS ([id number]) Visit Report for 07/08/2019 Arrival Information Details Patient Name: Date of Service: Sarah, [name] 07/08/2019 1:30 PM Medical Record Number: [id number] Patient Account Number: [id number] Date of Birth/Sex: Treating RN: September 19, 1955 (64 y.o. F) Dwiggins, Carollee Herter Primary Care Amaiah Cristiano: MA SO UDI, [place] MA LSA DA T Other Clinician: Referring Sharlette Jansma: Treating Penne Rosenstock/Extender: Baltazar Najjar MA SO UDI, ELHA MA LSA DA T Weeks in Treatment: 23 Visit Information History Since Last Visit Added or deleted any medications: No Patient Arrived: Crutches Any new allergies or adverse reactions: No Arrival Time: 14:33 Had a fall or experienced change in No Accompanied By: self activities of daily living that may affect Transfer Assistance: None risk of falls: Patient Identification Verified: Yes Signs or symptoms of abuse/neglect since No Secondary Verification Process Completed: Yes last visito Patient Requires Transmission-Based Precautions: No Hospitalized since last visit: No Patient Has Alerts: No Implantable device outside of the clinic No excluding cellular tissue based products placed in the center since last visit: Has Dressing in Place as Prescribed: Yes Has Compression in Place as Prescribed: Yes Has Footwear/Offloading in Place as Yes Prescribed: Left: T Contact Cast otal Right: Surgical Shoe with Pressure Relief Insole Pain Present Now: No Electronic Signature(s) Signed: 07/08/2019 6:16:49 PM By: [name] Entered By: [name] on 07/08/2019 14:34:13 -------------------------------------------------------------------------------- Compression Therapy Details Patient Name: Date of Service: Sarah Boss, Atheena J. 07/08/2019 1:30 PM Medical Record Number: [id number] Patient Account Number: [id number] Date of Birth/Sex: Treating RN: [birth year] (64 y.o. [name] Primary Care Jadin Kagel: MA SO UDI, Judene Companion MA LSA DA T Other  Clinician: Referring Sidra Oldfield: Treating Rykar Lebleu/Extender: Baltazar Najjar MA SO UDI, ELHA MA LSA DA T Weeks in Treatment: 23 Compression Therapy Performed for Wound Assessment: Wound #9 Right,Medial Foot Performed By: Clinician Zandra Abts, RN Compression Type: Three Layer Post Procedure Diagnosis Same as Pre-procedure Electronic Signature(s) Signed: 07/09/2019 5:24:50 PM By: Zandra Abts RN, BSN Entered By: Zandra Abts on 07/08/2019 15:05:20 -------------------------------------------------------------------------------- Encounter Discharge Information Details Patient Name: Date of Service: [name], [name] 07/08/2019 1:30 PM Medical Record Number: [id number] Patient Account Number: [id number] Date of Birth/Sex: Treating RN: 02-21-1955 (64 y.o. [name] Primary Care Autry Droege: MA SO UDI, Judene Companion MA LSA DA T Other Clinician: Referring Reginaldo Hazard: Treating Samamtha Tiegs/Extender: Baltazar Najjar MA SO UDI, ELHA MA LSA DA T Weeks in Treatment: 20 Encounter Discharge Information Items Discharge Condition: Stable Ambulatory Status: Crutches Discharge Destination: Home Transportation: Private Auto Accompanied By: self Schedule Follow-up Appointment: Yes Clinical Summary of Care: Patient Declined Electronic Signature(s) Signed: 07/08/2019 6:16:49 PM By: [name] Entered By: [name] on 07/08/2019 15:33:07 -------------------------------------------------------------------------------- Lower Extremity Assessment Details Patient Name: Date of Service: [facility], [name] 07/08/2019 1:30 PM Medical Record Number: [id number] Patient Account Number: [id number] Date of Birth/Sex: Treating RN: 07-03-1955 (64 y.o. F) [name] Primary Care Jayona Mccaig: MA SO UDI, [place] MA LSA DA T Other Clinician: Referring Chrsitopher Wik: Treating Nayelie Gionfriddo/Extender: Baltazar Najjar MA SO UDI, ELHA MA LSA DA T Weeks in Treatment: 23 Edema Assessment Assessed: [Left: No]  [Right: No] Edema: [Left: Yes] [Right: Yes] Calf Left: Right: Point of Measurement: 43 cm From Medial Instep 35 cm 35 cm Ankle Left: Right: Point of Measurement: 12 cm From Medial Instep 22 cm 22.5 cm Vascular Assessment Pulses: Dorsalis Pedis Palpable: [Left:Yes] [Right:Yes] Electronic Signature(s) Signed: 07/08/2019 6:16:49 PM By: [name] Entered By: [name] on 07/08/2019 14:35:09 -------------------------------------------------------------------------------- Multi Wound Chart Details Patient Name: Date of Service: [name], [name] 07/08/2019 1:30 PM  Medical Record Number: [id number] Patient Account Number: [id number] Date of Birth/Sex: Treating RN: May 25, 1955 (64 y.o. [name] Primary Care Jordanny Waddington: MA SO UDI, [place] MA LSA DA T Other Clinician: Referring Kimarion Chery: Treating Rupard Moffa/Extender: Linton Ham MA SO UDI, ELHA MA LSA DA T Weeks in Treatment: 23 Vital Signs Height(in): 68 Pulse(bpm): 37 Weight(lbs): 265 Blood Pressure(mmHg): 118/76 Body Mass Index(BMI): 40 Temperature(F): 97.9 Respiratory Rate(breaths/min): 18 Photos: [10:No Photos Left, Lateral, Plantar Foot] [9:No Photos Right, Medial Foot] [N/A:N/A N/A] Wound Location: [10:Blister] [9:Blister] [N/A:N/A] Wounding Event: [10:Diabetic Wound/Ulcer of the Lower] [9:Diabetic Wound/Ulcer of the Lower] [N/A:N/A] Primary Etiology: [10:Extremity Glaucoma, Chronic sinus] [9:Extremity Glaucoma, Chronic sinus] [N/A:N/A] Comorbid History: [10:problems/congestion, Anemia, Sleep Apnea, Hypertension, Type II Diabetes, Rheumatoid Arthritis, Neuropathy 11/18/2018] [9:problems/congestion, Anemia, Sleep Apnea, Hypertension, Type II Diabetes, Rheumatoid Arthritis, Neuropathy  06/18/2018] [N/A:N/A] Date Acquired: [10:23] [9:23] [N/A:N/A] Weeks of Treatment: [10:Open] [9:Open] [N/A:N/A] Wound Status: [10:0.5x0.5x0.1] [9:2.4x2.4x0.7] [N/A:N/A] Measurements L x W x D (cm) [10:0.196] [9:4.524]  [N/A:N/A] A (cm) : rea [10:0.02] [9:3.167] [N/A:N/A] Volume (cm) : [10:88.90%] [9:58.90%] [N/A:N/A] % Reduction in A rea: [10:99.20%] [9:68.00%] [N/A:N/A] % Reduction in Volume: [9:9] Starting Position 1 (o'clock): [9:1] Ending Position 1 (o'clock): [9:0.5] Maximum Distance 1 (cm): [10:No] [9:Yes] [N/A:N/A] Undermining: [10:Grade 2] [9:Grade 2] [N/A:N/A] Classification: [10:Medium] [9:Medium] [N/A:N/A] Exudate A mount: [10:Serosanguineous] [9:Serosanguineous] [N/A:N/A] Exudate Type: [10:red, brown] [9:red, brown] [N/A:N/A] Exudate Color: [10:Distinct, outline attached] [9:Well defined, not attached] [N/A:N/A] Wound Margin: [10:Large (67-100%)] [9:Large (67-100%)] [N/A:N/A] Granulation A mount: [10:Red, Pink] [9:Red, Pink] [N/A:N/A] Granulation Quality: [10:None Present (0%)] [9:None Present (0%)] [N/A:N/A] Necrotic A mount: [10:Fat Layer (Subcutaneous Tissue)] [9:Fat Layer (Subcutaneous Tissue)] [N/A:N/A] Exposed Structures: [10:Exposed: Yes Fascia: No Tendon: No Muscle: No Joint: No Bone: No Medium (34-66%)] [9:Exposed: Yes Fascia: No Tendon: No Muscle: No Joint: No Bone: No Small (1-33%)] [N/A:N/A] Treatment Notes Electronic Signature(s) Signed: 07/08/2019 6:03:41 PM By: Linton Ham MD Signed: 07/09/2019 5:24:50 PM By: Levan Hurst RN, BSN Entered By: Linton Ham on 07/08/2019 15:04:42 -------------------------------------------------------------------------------- Multi-Disciplinary Care Plan Details Patient Name: Date of Service: [facility] [name] 07/08/2019 1:30 PM Medical Record Number: [id number] Patient Account Number: [id number] Date of Birth/Sex: Treating RN: October 03, 1955 (64 y.o. [name] Primary Care Jymir Dunaj: MA SO UDI, Willette Pa MA LSA DA T Other Clinician: Referring Sascha Palma: Treating Abdulhadi Stopa/Extender: Linton Ham MA SO UDI, ELHA MA LSA DA T Weeks in Treatment: 23 Active Inactive Wound/Skin Impairment Nursing Diagnoses: Impaired tissue  integrity Knowledge deficit related to ulceration/compromised skin integrity Goals: Patient/caregiver will verbalize understanding of skin care regimen Date Initiated: 01/28/2019 Target Resolution Date: 08/09/2019 Goal Status: Active Interventions: Assess patient/caregiver ability to obtain necessary supplies Assess patient/caregiver ability to perform ulcer/skin care regimen upon admission and as needed Assess ulceration(s) every visit Provide education on ulcer and skin care Notes: Electronic Signature(s) Signed: 07/09/2019 5:24:50 PM By: Levan Hurst RN, BSN Entered By: Levan Hurst on 07/08/2019 18:57:33 -------------------------------------------------------------------------------- Pain Assessment Details Patient Name: Date of Service: [name], [name] 07/08/2019 1:30 PM Medical Record Number: [id number] Patient Account Number: [id number] Date of Birth/Sex: Treating RN: [birth year] (65 y.o. Clearnce Sorrel Primary Care Anitria Andon: MA SO UDI, [place] MA LSA DA T Other Clinician: Referring Hazelgrace Bonham: Treating Necha Harries/Extender: Linton Ham MA SO UDI, ELHA MA LSA DA T Weeks in Treatment: 23 Active Problems Location of Pain Severity and Description of Pain Patient Has Paino No Site Locations Pain Management and Medication Current Pain Management: Electronic Signature(s) Signed: 07/08/2019 6:16:49 PM By: Kela Millin Entered By: Kela Millin on 07/08/2019 14:34:47 -------------------------------------------------------------------------------- Patient/Caregiver  Education Details Patient Name: Date of Service: Sarah, [name] 6/21/2021andnbsp1:30 PM Medical Record Number: [id number] Patient Account Number: [id number] Date of Birth/Gender: Treating RN: 03/23/1955 (64 y.o. [name] Primary Care Physician: MA SO UDI, Judene Companion MA LSA DA T Other Clinician: Referring Physician: Treating Physician/Extender: Baltazar Najjar MA SO UDI, ELHA MA LSA DA  T Weeks in Treatment: 33 Education Assessment Education Provided To: Patient Education Topics Provided Wound/Skin Impairment: Methods: Explain/Verbal Responses: State content correctly [employer]) Signed: 07/09/2019 5:24:50 PM By: Zandra Abts RN, BSN Entered By: Zandra Abts on 07/08/2019 18:57:50 -------------------------------------------------------------------------------- Wound Assessment Details Patient Name: Date of Service: [name], Keilynn J. 07/08/2019 1:30 PM Medical Record Number: [id number] Patient Account Number: [id number] Date of Birth/Sex: Treating RN: 08-15-1955 (64 y.o. F) Dwiggins, Shannon Primary Care Ivann Trimarco: MA SO UDI, [place] MA LSA DA T Other Clinician: Referring Emeline Simpson: Treating Johan Creveling/Extender: Baltazar Najjar MA SO UDI, ELHA MA LSA DA T Weeks in Treatment: 23 Wound Status Wound Number: 10 Primary Diabetic Wound/Ulcer of the Lower Extremity Etiology: Wound Location: Left, Lateral, Plantar Foot Wound Open Wounding Event: Blister Status: Date Acquired: 11/18/2018 Comorbid Glaucoma, Chronic sinus problems/congestion, Anemia, Sleep Weeks Of Treatment: 23 History: Apnea, Hypertension, Type II Diabetes, Rheumatoid Arthritis, Clustered Wound: No Neuropathy Photos Wound Measurements Length: (cm) 0.5 Width: (cm) 0.5 Depth: (cm) 0.1 Area: (cm) 0.196 Volume: (cm) 0.02 % Reduction in Area: 88.9% % Reduction in Volume: 99.2% Epithelialization: Medium (34-66%) Tunneling: No Undermining: No Wound Description Classification: Grade 2 Wound Margin: Distinct, outline attached Exudate Amount: Medium Exudate Type: Serosanguineous Exudate Color: red, brown Foul Odor After Cleansing: No Slough/Fibrino No Wound Bed Granulation Amount: Large (67-100%) Exposed Structure Granulation Quality: Red, Pink Fascia Exposed: No Necrotic Amount: None Present (0%) Fat Layer (Subcutaneous Tissue) Exposed: Yes Tendon Exposed: No Muscle Exposed:  No Joint Exposed: No Bone Exposed: No Treatment Notes Wound #10 (Left, Lateral, Plantar Foot) 1. Cleanse With Wound Cleanser Soap and water 3. Primary Dressing Applied Calcium Alginate Ag 4. Secondary Dressing Roll Gauze Foam Drawtex 5. Secured With Tape 7. Footwear/Offloading device applied T Contact Cast otal Notes TCC applied by MD. Electronic Signature(s) Signed: 07/10/2019 5:20:20 PM By: Marlinda Mike Signed: 07/11/2019 5:29:15 PM By: [name] Previous Signature: 07/08/2019 6:16:49 PM Version By: [name] Entered By: Marlinda Mike on 07/10/2019 09:15:45 -------------------------------------------------------------------------------- Wound Assessment Details Patient Name: Date of Service: [name], [name] 07/08/2019 1:30 PM Medical Record Number: [id number] Patient Account Number: [id number] Date of Birth/Sex: Treating RN: 1956-01-08 (64 y.o. F) Dwiggins, Shannon Primary Care Terryn Rosenkranz: MA SO UDI, [place] MA LSA DA T Other Clinician: Referring Chaselynn Kepple: Treating Rosalena Mccorry/Extender: Baltazar Najjar MA SO UDI, ELHA MA LSA DA T Weeks in Treatment: 23 Wound Status Wound Number: 9 Primary Diabetic Wound/Ulcer of the Lower Extremity Etiology: Wound Location: Right, Medial Foot Wound Open Wounding Event: Blister Status: Date Acquired: 06/18/2018 Comorbid Glaucoma, Chronic sinus problems/congestion, Anemia, Sleep Weeks Of Treatment: 23 History: Apnea, Hypertension, Type II Diabetes, Rheumatoid Arthritis, Clustered Wound: No Neuropathy Photos Wound Measurements Length: (cm) 2.4 % R Width: (cm) 2.4 % R Depth: (cm) 0.7 Epi Area: (cm) 4.524 Tu Volume: (cm) 3.167 Un eduction in Area: 58.9% eduction in Volume: 68% thelialization: Small (1-33%) nneling: No dermining: Yes Starting Position (o'clock): 9 Ending Position (o'clock): 1 Maximum Distance: (cm) 0.5 Wound Description Classification: Grade 2 Fou Wound Margin: Well defined, not attached  Slo Exudate Amount: Medium Exudate Type: Serosanguineous Exudate Color: red, brown l Odor After Cleansing: No ugh/Fibrino No Wound Bed Granulation Amount: Large (  67-100%) Exposed Structure Granulation Quality: Red, Pink Fascia Exposed: No Necrotic Amount: None Present (0%) Fat Layer (Subcutaneous Tissue) Exposed: Yes Tendon Exposed: No Muscle Exposed: No Joint Exposed: No Bone Exposed: No Treatment Notes Wound #9 (Right, Medial Foot) 1. Cleanse With Wound Cleanser Soap and water 3. Primary Dressing Applied Polymem Ag 4. Secondary Dressing ABD Pad Dry Gauze Kerramax/Xtrasorb 6. Support Layer Applied 3 layer compression wrap Notes stockinette. Electronic Signature(s) Signed: 07/10/2019 5:20:20 PM By: Marlinda Mike Signed: 07/11/2019 5:29:15 PM By: [name] Previous Signature: 07/08/2019 6:16:49 PM Version By: [name] Entered By: Marlinda Mike on 07/10/2019 09:22:24 -------------------------------------------------------------------------------- Vitals Details Patient Name: Date of Service: [name], [name] 07/08/2019 1:30 PM Medical Record Number: [id number] Patient Account Number: [id number] Date of Birth/Sex: Treating RN: 02-03-1955 (64 y.o. F) Dwiggins, Shannon Primary Care Willet Schleifer: MA SO UDI, [place] MA LSA DA T Other Clinician: Referring Tiyanna Larcom: Treating Cedar Ditullio/Extender: Baltazar Najjar MA SO UDI, ELHA MA LSA DA T Weeks in Treatment: 23 Vital Signs Time Taken: 14:15 Temperature (F): 97.9 Height (in): 68 Pulse (bpm): 83 Weight (lbs): 265 Respiratory Rate (breaths/min): 18 Body Mass Index (BMI): 40.3 Blood Pressure (mmHg): 118/76 Reference Range: 80 - 120 mg / dl Electronic Signature(s) Signed: 07/08/2019 6:16:49 PM By: [name] Entered By: [name] on 07/08/2019 14:34:40

## 2019-07-15 ENCOUNTER — Encounter (HOSPITAL_BASED_OUTPATIENT_CLINIC_OR_DEPARTMENT_OTHER): Payer: Medicare Other | Admitting: Internal Medicine

## 2019-07-15 DIAGNOSIS — E11621 Type 2 diabetes mellitus with foot ulcer: Secondary | ICD-10-CM | POA: Diagnosis not present

## 2019-07-15 NOTE — Progress Notes (Signed)
[name] ([id number]) Visit Report for 07/15/2019 Debridement Details Patient Name: Date of Service: Sarah, [name] 07/15/2019 1:30 PM Medical Record Number: [id number] Patient Account Number: [id number] Date of Birth/Sex: Treating RN: [birth year] (64 y.o. [name] Primary Care Provider: MA SO UDI, [place] MA LSA DA T Other Clinician: Referring Provider: Treating Provider/Extender: Baltazar Najjar MA SO UDI, ELHA MA LSA DA T Weeks in Treatment: 24 Debridement Performed for Assessment: Wound #10 Left,Lateral,Plantar Foot Performed By: Physician [name], MD Debridement Type: Debridement Severity of Tissue Pre Debridement: Fat layer exposed Level of Consciousness (Pre-procedure): Awake and Alert Pre-procedure Verification/Time Out Yes - 14:36 Taken: Start Time: 14:36 T Area Debrided (L x W): otal 0.5 (cm) x 0.4 (cm) = 0.2 (cm) Tissue and other material debrided: Non-Viable, Callus, Skin: Dermis Level: Skin/Dermis Debridement Description: Selective/Open Wound Instrument: Curette Bleeding: Minimum Hemostasis Achieved: Pressure End Time: 14:37 Procedural Pain: 0 Post Procedural Pain: 0 Response to Treatment: Procedure was tolerated well Level of Consciousness (Post- Awake and Alert procedure): Post Debridement Measurements of Total Wound Length: (cm) 0.5 Width: (cm) 0.4 Depth: (cm) 0.1 Volume: (cm) 0.016 Character of Wound/Ulcer Post Debridement: Improved Severity of Tissue Post Debridement: Fat layer exposed Post Procedure Diagnosis Same as Pre-procedure Electronic Signature(s) Signed: 07/15/2019 5:46:39 PM By: Baltazar Najjar MD Signed: 07/15/2019 6:01:25 PM By: Sarah Abts RN, BSN Entered By: Baltazar Najjar on 07/15/2019 15:08:11 -------------------------------------------------------------------------------- Debridement Details Patient Name: Date of Service: [facility], [name] 07/15/2019 1:30 PM Medical Record Number:  [id number] Patient Account Number: [id number] Date of Birth/Sex: Treating RN: Aug 08, 1955 (64 y.o. [name] Primary Care Provider: MA SO UDI, Judene Companion MA LSA DA T Other Clinician: Referring Provider: Treating Provider/Extender: Baltazar Najjar MA SO UDI, ELHA MA LSA DA T Weeks in Treatment: 24 Debridement Performed for Assessment: Wound #9 Right,Medial Foot Performed By: Physician [name], MD Debridement Type: Debridement Severity of Tissue Pre Debridement: Fat layer exposed Level of Consciousness (Pre-procedure): Awake and Alert Pre-procedure Verification/Time Out Yes - 14:36 Taken: Start Time: 14:36 T Area Debrided (L x W): otal 2.1 (cm) x 2.2 (cm) = 4.62 (cm) Tissue and other material debrided: Non-Viable, Callus, Subcutaneous Level: Skin/Subcutaneous Tissue Debridement Description: Excisional Instrument: Curette Bleeding: Moderate Hemostasis Achieved: Silver Nitrate End Time: 14:37 Procedural Pain: 0 Post Procedural Pain: 0 Response to Treatment: Procedure was tolerated well Level of Consciousness (Post- Awake and Alert procedure): Post Debridement Measurements of Total Wound Length: (cm) 2.1 Width: (cm) 2.2 Depth: (cm) 0.7 Volume: (cm) 2.54 Character of Wound/Ulcer Post Debridement: Improved Severity of Tissue Post Debridement: Fat layer exposed Post Procedure Diagnosis Same as Pre-procedure Electronic Signature(s) Signed: 07/15/2019 5:46:39 PM By: Baltazar Najjar MD Signed: 07/15/2019 6:01:25 PM By: Sarah Abts RN, BSN Entered By: Baltazar Najjar on 07/15/2019 15:08:36 -------------------------------------------------------------------------------- HPI Details Patient Name: Date of Service: Sarah CH, [name] 07/15/2019 1:30 PM Medical Record Number: [id number] Patient Account Number: [id number] Date of Birth/Sex: Treating RN: 11-11-1955 (64 y.o. [name] Primary Care Provider: MA SO UDI, [place] MA LSA DA T Other Clinician: Referring  Provider: Treating Provider/Extender: Baltazar Najjar MA SO UDI, ELHA MA LSA DA T Weeks in Treatment: 24 History of Present Illness HPI Description: 64 yrs old bf here for follow up recurrent left charcot foot ulcer. She has DM. She is not a smoker. She states a blister developed 3 months ago at site of previous breakdown from 1 year ago. It was debrided at the podiatrist's office. She went to the ER and then  sent here. She denies pain. Xray was negative for osteo. Culture from this clinic negative. 09/08/14 Referred by [name] to [name] for Charcot foot. Seen by him and MRI scheduled 09/19/14. Prior silver collagen but this was not ordered last visit, patient has been rinsing with saline alone. Re institute silver collagen every other day. F/u 2 weeks with [name] as Labor Day holiday. Supplies ordered. 09/25/14; this is a patient with a Charcot foot on the left. she has a wound over the plantar aspect of her left calcaneus. She has been seen by [name] of orthopedic surgery and has had an MRI. She is apparently going within the next week or 2 for corrective surgery which will involve an external fixator. I don't have any of the details of this. 10/06/14; The patient is a 64 yrs old bf with a left Charcot foot and ulcer on the plantar. 12/01/14 Returns post surgery with [name]. Has follow up visit later this week with him, currently in facility and NWB over this extremity. States no drainage from foot and no current wound care. On exam callus and scab in place, suspect healed. Has external fixator in place. Will defer to [name] for management, ok to place moisturizing cream over scabs and callus and she will call if she requires follow up here. READMISSION 01/28/2019 Patient was in the clinic in 2016 for a wound on the plantar aspect of her left calcaneus. Predominantly cared for by [name] she was referred to [name] and had corrective surgery for the position of her  left ankle I believe. She had previously been here in 2015 and then prior to that in 2011 2012 that I do not have these records. More recently she has developed fairly extensive wounds on the right medial foot, left lateral foot and a small area on the right medial great toe. Right medial foot wound has been there for 6 to 7 months, left foot wound for 2 to 3 months and the right great toe only over the last few weeks. She has been followed by [name] at Dr. Laverta [place] office it sounds as though she has been undergoing callus paring and silver nitrate. The patient has been washing these off with soap and water and plan applying dry dressing Past medical history, type 2 diabetes well controlled with a recent hemoglobin A1c of 7 on 11/16, type 2 diabetes with peripheral neuropathy, previous left Charcot joint surgery bilateral Charcot joints currently, stage III chronic renal failure, hypertension, obstructive sleep apnea, history of MRSA and gastroesophageal reflux. 1/18; this is a patient with bilateral Charcot feet. Plain x-rays that I did of both of these wounds that are either at bone or precariously close to bone were done. On the right foot this showed possible lytic destruction involving the anterior aspect of the talus which may represent a wound osteomyelitis. An MRI was recommended. On the left there was no definite lytic destruction although the wound is deep undermining's and I think probably requires an MRI on this basis in any case 1/25; patient was supposed to have her MRI last Friday of her bilateral feet however they would not do it because there was silver alginate in her dressings, will replace with calcium alginate today and will see if we can get her done today 2/1; patient did not get her MRIs done last week because of issues with the MRI department receiving orders to do bilateral feet. She has 2 deep wounds in the  setting of Charcot feet deformities bilaterally. Both of them  at one point had exposed bone although I had trouble demonstrating that today. I am not sure that we can offload these very aggressively as I watched her leave the clinic last week her gait is very narrow and unsteady. 2/15; the MRI of her right foot did not show osteomyelitis potentially osteonecrosis of the talus. 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 The wounds are a large wound on the right medial foot and on the left a substantial wound on the left lateral plantar calcaneus. We have been using silver alginate. Ultimately I think we need to consider a total contact cast on the right while we work through the possibility of osteomyelitis in the left. I watched her walk out with her crutches I have some trepidation about the ability to walk with the cast on her right foot. Nevertheless with her Charcot deformities I do not think there is a way to heal these short of offloading them aggressively. 2/22; the culture of the bone in the left foot that I did last week showed a few Citrobacter Koseri as well as some streptococcal species. In my attempted bone for pathology did not actually yield a lot of bone the pathologist did not identify any osteomyelitis. 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 Is promised today and aggressive debridement of the right foot and a total contact cast which surprisingly she seemed to tolerate quite well 03/13/2019 upon evaluation today patient appears to be doing well with her total contact cast she is here for the first cast change. She had no areas of rubbing or any other complications at this point. Overall things are doing quite well. 2/26; the patient was kindly seen by Dr. Orvan Falconer of infectious disease on 2/25. She is now on IV vancomycin PICC line placement in 2 days  and oral Flagyl. This was in response to her MRI showing osteomyelitis of the left calcaneus concerning for osteomyelitis. We have been putting her right leg in a total contact cast. Our nurses report drainage. She is tolerating the total contact cast well. 3/4; the patient is started her IV antibiotics and is taking IV vancomycin and oral Flagyl. She appears to be tolerating these both well. This is for osteomyelitis involving the left calcaneus. The area on the right in the setting of bilateral Charcot deformities did not have any bone infection on MRI. We put a total contact cast on her with silver alginate as the primary dressing and she returns today in follow-up. Per our intake nurse there was too much drainage to leave this on all week therefore we will bring her back for an early change 3/8; follow-up total contact cast she is still having a lot of drainage probably too much drainage from the right foot wound will week with the same cast. She will be back on Thursday. The area on the left looks somewhat better 3/11; patient here for a total contact cast change. 3/15; patient came in for wound care evaluation. She is still on IV vancomycin and oral Flagyl for osteomyelitis in the left foot. The area on the right foot we are putting in a total contact cast. 3/18 for total contact cast change on the right foot 3/22; the patient was here for wound review  today. The area on the right foot is slightly smaller in terms of surface area. However the surface of the wound is very gritty and hyper granulated. More problematically the area on the plantar left foot has increased depth indeed in the center of this it probes to bone. We have been using Hydrofera Blue in both areas. She is on antibiotics as prescribed by infectious disease IV vancomycin and oral Flagyl 3/25; the patient is in for total contact cast change. Our intake nurse was concerned about the amount of drainage which soaked right to the  multiple layers we are putting on this. Wondered about options. The wound bed actually looks as healthy as this is looked although there may not be a lot of change in dimensions things are looking a lot better. If we are going to heal this woman's wound which is in the middle of her right Charcot foot this is going to need to be offloaded. There are not many alternatives 3/29; still not a lot of improvement although the surface of the right wound looks better. We have been using Hydrofera Blue 04/17/2019 upon evaluation today patient appears to be doing well with a total contact cast. With actually having to change this twice a week. She saw [name] for wound care earlier in the week this is for the cast change today. That is due to the fact that she has significant drainage. Overall the wound is been looking excellent however. 4/5; we continue to put a total contact cast on this patient with Hydrofera Blue. Still a lot of drainage which precludes a simple weekly change or changing this twice a week. 4/8; total contact cast changed today. Apparently the wound looks better per our intake nurse 4/12; patient here for wound care evaluation. Wounds on the right foot have not changed in dimension none about a month left foot looks similar. Apparently her antibiotics that we are giving her for osteomyelitis in the left foot complete on Wednesday. We have been using Hydrofera Blue on the right foot under a total contact cast. Nurses report greenish drainage although I think this may be discoloration from the [facility] 4/15; back for cast change wounds look quite good although still moderate amount of drainage. T contact cast reapplied otal 4/19;. Wound evaluation. Wound on the right foot is smaller surface looks healthy. There is still the divot in the middle part of this wound but I could not feel any bone. Area on the left foot also looks better She has completed her antibiotics but still has the  PICC line in. 4/23; patient comes back for a total contact cast change this was done in the standard fashion no issues 4/26; wound measures slightly larger on the right foot which is disappointing. She sees Dr. Orvan Falconer with regards to her antibiotics on Wednesday. I believe she is on vancomycin IV and oral Flagyl. 4/29; in for her routine total contact cast change. She saw Dr. Orvan Falconer this morning. He is asking about antibiotic continuation I will be in touch with him. I did not look at her wound today 5/3; patient saw Dr. Orvan Falconer on 4/28. I did send him a note asking about the continuation of perhaps an oral regimen. I have not heard back. She still has an elevated sedimentation rate at 81. Her wounds are smaller. 5/6; in for her total contact cast change. We did not look at the wound today. She has seen Dr. Orvan Falconer and is now on oral Keflex and Flagyl.  She seems to be tolerating these well 5/10; she had her PICC line removed. The wound on the right foot after some initial improvement has not made any improvement even with the total contact cast. Still having a lot of drainage. Likewise the area on the left foot really is not any better either 5/14; in for a cast change today. We will look at the right foot on Monday. If this is not making any progress I am going to try to cast the left foot. 5/17; the wound on the right foot is absolutely no different. I am going to put ointment on this area and change the cast of the left foot. We have been using silver alginate there 5/21; apparently the area on the right foot was better per our intake nurse although I did not see it. We did a standard total contact cast change on the left foot we have been applying silver alginate to the wound here. 06/10/19-Area on the right foot per dimensions slightly better, the left foot has still some depth applying silver alginate to the wound with TCC 06/13/19-Patient here for Casting to Left foot 6/1; patient had  the total contact cast were placed on the left foot. Noted some swelling in her right leg although the dimensions of the wound on the right foot are better. We have been using PolyMem on the right and silver alginate on the left under the cast 6/7; we continue to make nice progress in both her plantar feet wound. We are putting the total contact cast on the left. Surprisingly in spite of this the area on the right medial foot in the middle of her Charcot deformities actually is making progress as well 07/01/2019 upon evaluation today patient actually appears to be doing quite well with regard to her wounds on the feet compared to when I saw her last that has been quite some time. With that being said I do believe the total contact cast has been beneficial in this left foot and she has a lot of signs of improvement she does have some callus overhanging which is caused a little bit of a ledge and an issue here. I do believe that we may be able to do something to help in that regard. Fortunately otherwise the wound seems to be progressing quite nicely. 6/21; the patient's area on the left foot that we are currently casting is just about closed. The area on the right medial foot has a healthy looking surface but may be not changed as much from last week in terms of surface area. We are using silver alginate on the right and polymen Ag under the cast 6/28; only a small open area on the left foot remains. Using silver alginate under a total contact cast. She has a diabetic shoe I have asked her to bring that for next week where this may be closed. The area on the right foot with a Charcot deformity is also measuring slightly smaller. We are using PolyMem Ag here Electronic Signature(s) Signed: 07/15/2019 5:46:39 PM By: Baltazar Najjar MD Entered By: Baltazar Najjar on 07/15/2019 15:09:53 -------------------------------------------------------------------------------- Physical Exam Details Patient Name: Date  of Service: Sarah [name], [name] 07/15/2019 1:30 PM Medical Record Number: [id number] Patient Account Number: [id number] Date of Birth/Sex: Treating RN: 27-May-1955 (64 y.o. [name] Primary Care Provider: MA SO UDI, Judene Companion MA LSA DA T Other Clinician: Referring Provider: Treating Provider/Extender: Baltazar Najjar MA SO UDI, ELHA MA LSA DA T Weeks in Treatment: 24  Constitutional Patient is hypertensive.. Pulse regular and within target range for patient.Marland Kitchen Respirations regular, non-labored and within target range.. Temperature is normal and within the target range for the patient.Marland Kitchen Appears in no distress. Notes Wound exam On the left foot callus and dry skin around the small wound circumference removed with a #5 curette there was no bleeding. On the right foot in the middle of the Charcot deformity. The wound surface looks healthy however the edge has callus and subcutaneous tissue removed from the circumference to try and get a healthier edge for healing. Hemostasis with a pressure dressing We reapplied the total contact cast on the left foot in the standard fashion Electronic Signature(s) Signed: 07/15/2019 5:46:39 PM By: Baltazar Najjar MD Entered By: Baltazar Najjar on 07/15/2019 15:10:54 -------------------------------------------------------------------------------- Physician Orders Details Patient Name: Date of Service: Sarah CH, [name] 07/15/2019 1:30 PM Medical Record Number: [id number] Patient Account Number: [id number] Date of Birth/Sex: Treating RN: April 05, 1955 (64 y.o. [name] Primary Care Provider: MA SO UDI, [place] MA LSA DA T Other Clinician: Referring Provider: Treating Provider/Extender: Baltazar Najjar MA SO UDI, ELHA MA LSA DA T Weeks in Treatment: 24 Verbal / Phone Orders: No Diagnosis Coding ICD-10 Coding Code Description E11.621 Type 2 diabetes mellitus with foot ulcer L97.524 Non-pressure chronic ulcer of other part of left foot with necrosis  of bone L97.511 Non-pressure chronic ulcer of other part of right foot limited to breakdown of skin L97.514 Non-pressure chronic ulcer of other part of right foot with necrosis of bone E11.42 Type 2 diabetes mellitus with diabetic polyneuropathy M14.671 Charcot's joint, right ankle and foot M14.672 Charcot's joint, left ankle and foot 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 Dressing Change Frequency Wound #10 Left,Lateral,Plantar Foot Do not change entire dressing for one week. Wound #9 Right,Medial Foot Do not change entire dressing for one week. Wound Cleansing May shower with protection. Primary Wound Dressing Wound #10 Left,Lateral,Plantar Foot Calcium Alginate with Silver Wound #9 Right,Medial Foot Polymem Silver Secondary Dressing Wound #10 Left,Lateral,Plantar Foot Foam - donut Kerlix/Rolled Gauze Dry Gauze Drawtex - cut to fit inside wound edges Wound #9 Right,Medial Foot Dry Gauze ABD pad Zetuvit or Kerramax Edema Control 3 Layer Compression System - Right Lower Extremity Avoid standing for long periods of time Elevate legs to the level of the heart or above for 30 minutes daily and/or when sitting, a frequency of: - throughout the day Exercise regularly Off-Loading Total Contact Cast to Left Lower Extremity Open toe surgical shoe to: - right foot Electronic Signature(s) Signed: 07/15/2019 5:46:39 PM By: Baltazar Najjar MD Signed: 07/15/2019 6:01:25 PM By: Sarah Abts RN, BSN Entered By: [name] on 07/15/2019 14:43:50 -------------------------------------------------------------------------------- Problem List Details Patient Name: Date of Service: Sarah CH, [name] 07/15/2019 1:30 PM Medical Record Number: [id number] Patient Account Number: [id number] Date of Birth/Sex: Treating RN: [birth year] (64 y.o. [name] Primary Care Provider: MA SO UDI, [place] MA LSA DA T Other  Clinician: Referring Provider: Treating Provider/Extender: Abe People, ELHA MA LSA DA T Weeks in Treatment: 24 Active Problems ICD-10 Encounter Code Description Active Date MDM Diagnosis E11.621 Type 2 diabetes mellitus with foot ulcer 01/28/2019 No Yes L97.524 Non-pressure chronic ulcer of other part of left foot with necrosis of bone 01/28/2019 No Yes L97.511 Non-pressure chronic ulcer of other part of right foot limited to breakdown of 01/28/2019 No Yes skin L97.514 Non-pressure chronic ulcer of other part of right foot with necrosis  of bone 01/28/2019 No Yes E11.42 Type 2 diabetes mellitus with diabetic polyneuropathy 01/28/2019 No Yes M14.671 Charcot's joint, right ankle and foot 01/28/2019 No Yes M14.672 Charcot's joint, left ankle and foot 01/28/2019 No Yes 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 07/15/2019 1:30 PM Medical Record Number: [id number] Patient Account Number: [id number] Date of Birth/Sex: Treating RN: 07/03/1955 (64 y.o. [name] Primary Care Provider: MA SO UDI, [employer] MA LSA DA T Other Clinician: Referring Provider: Treating Provider/Extender: Baltazar Najjar MA SO UDI, ELHA MA LSA DA T Weeks in Treatment: 24 Subjective History of Present Illness (HPI) 64 yrs old bf here for follow up recurrent left charcot foot ulcer. She has DM. She is not a smoker. She states a blister developed 3 months ago at site of previous breakdown from 1 year ago. It was debrided at the podiatrist's office. She went to the ER and then sent here. She denies pain. Xray was negative for osteo. Culture from this clinic  negative. 09/08/14 Referred by [name] to [name] for Charcot foot. Seen by him and MRI scheduled 09/19/14. Prior silver collagen but this was not ordered last visit, patient has been rinsing with saline alone. Re institute silver collagen every other day. F/u 2 weeks with [name] as Labor Day holiday. Supplies ordered. 09/25/14; this is a patient with a Charcot foot on the left. she has a wound over the plantar aspect of her left calcaneus. She has been seen by [name] of orthopedic surgery and has had an MRI. She is apparently going within the next week or 2 for corrective surgery which will involve an external fixator. I don't have any of the details of this. 10/06/14; The patient is a 64 yrs old bf with a left Charcot foot and ulcer on the plantar. 12/01/14 Returns post surgery with [name]. Has follow up visit later this week with him, currently in facility and [facility] over this extremity. States no drainage from foot and no current wound care. On exam callus and scab in place, suspect healed. Has external fixator in place. Will defer to [name] for management, ok to place moisturizing cream over scabs and callus and she will call if she requires follow up here. READMISSION 01/28/2019 Patient was in the clinic in 2016 for a wound on the plantar aspect of her left calcaneus. Predominantly cared for by [name] she was referred to [name] and had corrective surgery for the position of her left ankle I believe. She had previously been here in 2015 and then prior to that in 2011 2012 that I do not have these records. More recently she has developed fairly extensive wounds on the right medial foot, left lateral foot and a small area on the right medial great toe. Right medial foot wound has been there for 6 to 7 months, left foot wound for 2 to 3 months and the right great toe only over the last few weeks. She has been followed by [name] at Dr. Laverta [place] office it sounds as  though she has been undergoing callus paring and silver nitrate. The patient has been washing these off with soap and water and plan applying dry dressing Past medical history, type 2 diabetes well controlled with a recent hemoglobin A1c  of 7 on 11/16, type 2 diabetes with peripheral neuropathy, previous left Charcot joint surgery bilateral Charcot joints currently, stage III chronic renal failure, hypertension, obstructive sleep apnea, history of MRSA and gastroesophageal reflux. 1/18; this is a patient with bilateral Charcot feet. Plain x-rays that I did of both of these wounds that are either at bone or precariously close to bone were done. On the right foot this showed possible lytic destruction involving the anterior aspect of the talus which may represent a wound osteomyelitis. An MRI was recommended. On the left there was no definite lytic destruction although the wound is deep undermining's and I think probably requires an MRI on this basis in any case 1/25; patient was supposed to have her MRI last Friday of her bilateral feet however they would not do it because there was silver alginate in her dressings, will replace with calcium alginate today and will see if we can get her done today 2/1; patient did not get her MRIs done last week because of issues with the MRI department receiving orders to do bilateral feet. She has 2 deep wounds in the setting of Charcot feet deformities bilaterally. Both of them at one point had exposed bone although I had trouble demonstrating that today. I am not sure that we can offload these very aggressively as I watched her leave the clinic last week her gait is very narrow and unsteady. 2/15; the MRI of her right foot did not show osteomyelitis potentially osteonecrosis of the talus. 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 The wounds are a large wound on the right medial foot and on the left a substantial  wound on the left lateral plantar calcaneus. We have been using silver alginate. Ultimately I think we need to consider a total contact cast on the right while we work through the possibility of osteomyelitis in the left. I watched her walk out with her crutches I have some trepidation about the ability to walk with the cast on her right foot. Nevertheless with her Charcot deformities I do not think there is a way to heal these short of offloading them aggressively. 2/22; the culture of the bone in the left foot that I did last week showed a few Citrobacter Koseri as well as some streptococcal species. In my attempted bone for pathology did not actually yield a lot of bone the pathologist did not identify any osteomyelitis. 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 Is promised today and aggressive debridement of the right foot and a total contact cast which surprisingly she seemed to tolerate quite well 03/13/2019 upon evaluation today patient appears to be doing well with her total contact cast she is here for the first cast change. She had no areas of rubbing or any other complications at this point. Overall things are doing quite well. 2/26; the patient was kindly seen by Dr. Orvan Falconer of infectious disease on 2/25. She is now on IV vancomycin PICC line placement in 2 days and oral Flagyl. This was in response to her MRI showing osteomyelitis of the left calcaneus concerning for osteomyelitis. We have been putting her right leg in a total contact cast. Our nurses report drainage. She is tolerating the total contact cast well. 3/4; the patient is started her IV antibiotics and is taking IV  vancomycin and oral Flagyl. She appears to be tolerating these both well. This is for osteomyelitis involving the left calcaneus. The area on the right in the setting  of bilateral Charcot deformities did not have any bone infection on MRI. We put a total contact cast on her with silver alginate as the primary dressing and she returns today in follow-up. Per our intake nurse there was too much drainage to leave this on all week therefore we will bring her back for an early change 3/8; follow-up total contact cast she is still having a lot of drainage probably too much drainage from the right foot wound will week with the same cast. She will be back on Thursday. The area on the left looks somewhat better 3/11; patient here for a total contact cast change. 3/15; patient came in for wound care evaluation. She is still on IV vancomycin and oral Flagyl for osteomyelitis in the left foot. The area on the right foot we are putting in a total contact cast. 3/18 for total contact cast change on the right foot 3/22; the patient was here for wound review today. The area on the right foot is slightly smaller in terms of surface area. However the surface of the wound is very gritty and hyper granulated. More problematically the area on the plantar left foot has increased depth indeed in the center of this it probes to bone. We have been using Hydrofera Blue in both areas. She is on antibiotics as prescribed by infectious disease IV vancomycin and oral Flagyl 3/25; the patient is in for total contact cast change. Our intake nurse was concerned about the amount of drainage which soaked right to the multiple layers we are putting on this. Wondered about options. The wound bed actually looks as healthy as this is looked although there may not be a lot of change in dimensions things are looking a lot better. If we are going to heal this woman's wound which is in the middle of her right Charcot foot this is going to need to be offloaded. There are not many alternatives 3/29; still not a lot of improvement although the surface of the right wound looks better. We have been using  Hydrofera Blue 04/17/2019 upon evaluation today patient appears to be doing well with a total contact cast. With actually having to change this twice a week. She saw [name] for wound care earlier in the week this is for the cast change today. That is due to the fact that she has significant drainage. Overall the wound is been looking excellent however. 4/5; we continue to put a total contact cast on this patient with Hydrofera Blue. Still a lot of drainage which precludes a simple weekly change or changing this twice a week. 4/8; total contact cast changed today. Apparently the wound looks better per our intake nurse 4/12; patient here for wound care evaluation. Wounds on the right foot have not changed in dimension none about a month left foot looks similar. Apparently her antibiotics that we are giving her for osteomyelitis in the left foot complete on Wednesday. We have been using Hydrofera Blue on the right foot under a total contact cast. Nurses report greenish drainage although I think this may be discoloration from the [facility] 4/15; back for cast change wounds look quite good although still moderate amount of drainage. T contact cast reapplied otal 4/19;. Wound evaluation. Wound on the right foot is smaller surface looks healthy. There is still the  divot in the middle part of this wound but I could not feel any bone. Area on the left foot also looks better She has completed her antibiotics but still has the PICC line in. 4/23; patient comes back for a total contact cast change this was done in the standard fashion no issues 4/26; wound measures slightly larger on the right foot which is disappointing. She sees Dr. Orvan Falconer with regards to her antibiotics on Wednesday. I believe she is on vancomycin IV and oral Flagyl. 4/29; in for her routine total contact cast change. She saw Dr. Orvan Falconer this morning. He is asking about antibiotic continuation I will be in touch with him. I  did not look at her wound today 5/3; patient saw Dr. Orvan Falconer on 4/28. I did send him a note asking about the continuation of perhaps an oral regimen. I have not heard back. She still has an elevated sedimentation rate at 81. Her wounds are smaller. 5/6; in for her total contact cast change. We did not look at the wound today. She has seen Dr. Orvan Falconer and is now on oral Keflex and Flagyl. She seems to be tolerating these well 5/10; she had her PICC line removed. The wound on the right foot after some initial improvement has not made any improvement even with the total contact cast. Still having a lot of drainage. Likewise the area on the left foot really is not any better either 5/14; in for a cast change today. We will look at the right foot on Monday. If this is not making any progress I am going to try to cast the left foot. 5/17; the wound on the right foot is absolutely no different. I am going to put ointment on this area and change the cast of the left foot. We have been using silver alginate there 5/21; apparently the area on the right foot was better per our intake nurse although I did not see it. We did a standard total contact cast change on the left foot we have been applying silver alginate to the wound here. 06/10/19-Area on the right foot per dimensions slightly better, the left foot has still some depth applying silver alginate to the wound with TCC 06/13/19-Patient here for Casting to Left foot 6/1; patient had the total contact cast were placed on the left foot. Noted some swelling in her right leg although the dimensions of the wound on the right foot are better. We have been using PolyMem on the right and silver alginate on the left under the cast 6/7; we continue to make nice progress in both her plantar feet wound. We are putting the total contact cast on the left. Surprisingly in spite of this the area on the right medial foot in the middle of her Charcot deformities actually  is making progress as well 07/01/2019 upon evaluation today patient actually appears to be doing quite well with regard to her wounds on the feet compared to when I saw her last that has been quite some time. With that being said I do believe the total contact cast has been beneficial in this left foot and she has a lot of signs of improvement she does have some callus overhanging which is caused a little bit of a ledge and an issue here. I do believe that we may be able to do something to help in that regard. Fortunately otherwise the wound seems to be progressing quite nicely. 6/21; the patient's area on the left foot that  we are currently casting is just about closed. The area on the right medial foot has a healthy looking surface but may be not changed as much from last week in terms of surface area. We are using silver alginate on the right and polymen Ag under the cast 6/28; only a small open area on the left foot remains. Using silver alginate under a total contact cast. She has a diabetic shoe I have asked her to bring that for next week where this may be closed. The area on the right foot with a Charcot deformity is also measuring slightly smaller. We are using PolyMem Ag here Objective Constitutional Patient is hypertensive.. Pulse regular and within target range for patient.Marland Kitchen Respirations regular, non-labored and within target range.. Temperature is normal and within the target range for the patient.Marland Kitchen Appears in no distress. Vitals Time Taken: 1:28 PM, Height: 68 in, Weight: 265 lbs, BMI: 40.3, Temperature: 98.5 F, Pulse: 90 bpm, Respiratory Rate: 18 breaths/min, Blood Pressure: 144/85 mmHg. General Notes: Wound exam ooOn the left foot callus and dry skin around the small wound circumference removed with a #5 curette there was no bleeding. ooOn the right foot in the middle of the Charcot deformity. The wound surface looks healthy however the edge has callus and subcutaneous tissue  removed from the circumference to try and get a healthier edge for healing. Hemostasis with a pressure dressing ooWe reapplied the total contact cast on the left foot in the standard fashion Integumentary (Hair, Skin) Wound #10 status is Open. Original cause of wound was Blister. The wound is located on the Left,Lateral,Plantar Foot. The wound measures 0.5cm length x 0.4cm width x 0.1cm depth; 0.157cm^2 area and 0.016cm^3 volume. There is Fat Layer (Subcutaneous Tissue) Exposed exposed. There is no tunneling or undermining noted. There is a medium amount of serosanguineous drainage noted. The wound margin is distinct with the outline attached to the wound base. There is large (67-100%) red, pink granulation within the wound bed. There is no necrotic tissue within the wound bed. Wound #9 status is Open. Original cause of wound was Blister. The wound is located on the Right,Medial Foot. The wound measures 2.1cm length x 2.2cm width x 0.7cm depth; 3.629cm^2 area and 2.54cm^3 volume. There is Fat Layer (Subcutaneous Tissue) Exposed exposed. There is no tunneling or undermining noted. There is a medium amount of serosanguineous drainage noted. The wound margin is well defined and not attached to the wound base. There is large (67- 100%) red, pink granulation within the wound bed. There is no necrotic tissue within the wound bed. Assessment Active Problems ICD-10 Type 2 diabetes mellitus with foot ulcer Non-pressure chronic ulcer of other part of left foot with necrosis of bone Non-pressure chronic ulcer of other part of right foot limited to breakdown of skin Non-pressure chronic ulcer of other part of right foot with necrosis of bone Type 2 diabetes mellitus with diabetic polyneuropathy Charcot's joint, right ankle and foot Charcot's joint, left ankle and foot Other chronic osteomyelitis, left ankle and foot Procedures Wound #10 Pre-procedure diagnosis of Wound #10 is a Diabetic Wound/Ulcer of  the Lower Extremity located on the Left,Lateral,Plantar Foot .Severity of Tissue Pre Debridement is: Fat layer exposed. There was a Selective/Open Wound Skin/Dermis Debridement with a total area of 0.2 sq cm performed by [name], MD. With the following instrument(s): Curette to remove Non-Viable tissue/material. Material removed includes Callus and Skin: Dermis and. No specimens were taken. A time out was conducted at 14:36, prior  to the start of the procedure. A Minimum amount of bleeding was controlled with Pressure. The procedure was tolerated well with a pain level of 0 throughout and a pain level of 0 following the procedure. Post Debridement Measurements: 0.5cm length x 0.4cm width x 0.1cm depth; 0.016cm^3 volume. Character of Wound/Ulcer Post Debridement is improved. Severity of Tissue Post Debridement is: Fat layer exposed. Post procedure Diagnosis Wound #10: Same as Pre-Procedure Pre-procedure diagnosis of Wound #10 is a Diabetic Wound/Ulcer of the Lower Extremity located on the Left,Lateral,Plantar Foot . There was a T Contact otal Cast Procedure by Ricard Dillon., MD. Post procedure Diagnosis Wound #10: Same as Pre-Procedure Wound #9 Pre-procedure diagnosis of Wound #9 is a Diabetic Wound/Ulcer of the Lower Extremity located on the Right,Medial Foot .Severity of Tissue Pre Debridement is: Fat layer exposed. There was a Excisional Skin/Subcutaneous Tissue Debridement with a total area of 4.62 sq cm performed by Ricard Dillon., MD. With the following instrument(s): Curette to remove Non-Viable tissue/material. Material removed includes Callus and Subcutaneous Tissue and. No specimens were taken. A time out was conducted at 14:36, prior to the start of the procedure. A Moderate amount of bleeding was controlled with Silver Nitrate. The procedure was tolerated well with a pain level of 0 throughout and a pain level of 0 following the procedure. Post Debridement  Measurements: 2.1cm length x 2.2cm width x 0.7cm depth; 2.54cm^3 volume. Character of Wound/Ulcer Post Debridement is improved. Severity of Tissue Post Debridement is: Fat layer exposed. Post procedure Diagnosis Wound #9: Same as Pre-Procedure Pre-procedure diagnosis of Wound #9 is a Diabetic Wound/Ulcer of the Lower Extremity located on the Right,Medial Foot . There was a Three Layer Compression Therapy Procedure by Levan Hurst, RN. Post procedure Diagnosis Wound #9: Same as Pre-Procedure Plan Follow-up Appointments: Return Appointment in 1 week. Dressing Change Frequency: Wound #10 Left,Lateral,Plantar Foot: Do not change entire dressing for one week. Wound #9 Right,Medial Foot: Do not change entire dressing for one week. Wound Cleansing: May shower with protection. Primary Wound Dressing: Wound #10 Left,Lateral,Plantar Foot: Calcium Alginate with Silver Wound #9 Right,Medial Foot: Polymem Silver Secondary Dressing: Wound #10 Left,Lateral,Plantar Foot: Foam - donut Kerlix/Rolled Gauze Dry Gauze Drawtex - cut to fit inside wound edges Wound #9 Right,Medial Foot: Dry Gauze ABD pad Zetuvit or Kerramax Edema Control: 3 Layer Compression System - Right Lower Extremity Avoid standing for long periods of time Elevate legs to the level of the heart or above for 30 minutes daily and/or when sitting, a frequency of: - throughout the day Exercise regularly Off-Loading: T Contact Cast to Left Lower Extremity otal Open toe surgical shoe to: - right foot 1. No change the primary dressings which are silver alginate of the total contact cast 2. PolyMem Ag to the right foot 3. Debridements on both side as noted Electronic Signature(s) Signed: 07/15/2019 5:46:39 PM By: Linton Ham MD Entered By: Linton Ham on 07/15/2019 15:11:34 -------------------------------------------------------------------------------- Total Contact Cast Details Patient Name: Date of Service: [facility], [name] 07/15/2019 1:30 PM Medical Record Number: [id number] Patient Account Number: [id number] Date of Birth/Sex: Treating RN: 1955/02/28 (64 y.o. [name] Primary Care Provider: Other Clinician: MA SO UDI, Edd Fabian LSA DA T Referring Provider: Treating Provider/Extender: Linton Ham MA SO UDI, ELHA MA LSA DA T Weeks in Treatment: 24 T Contact Cast Applied for Wound Assessment: otal Wound #10 Left,Lateral,Plantar Foot Performed By: Physician Ricard Dillon., MD Post Procedure Diagnosis Same as Pre-procedure Electronic Signature(s) Signed: 07/15/2019 5:46:39  PM By: Baltazar Najjar MD Entered By: Baltazar Najjar on 07/15/2019 15:08:23 -------------------------------------------------------------------------------- SuperBill Details Patient Name: Date of Service: [facility], Nezzie J. 07/15/2019 Medical Record Number: [id number] Patient Account Number: [id number] Date of Birth/Sex: Treating RN: 08/07/1955 (63 y.o. [name] Primary Care Provider: MA SO UDI, Seis Lagos MA LSA DA T Other Clinician: Referring Provider: Treating Provider/Extender: Abe People, ELHA MA LSA DA T Weeks in Treatment: 24 Diagnosis Coding ICD-10 Codes Code Description E11.621 Type 2 diabetes mellitus with foot ulcer L97.524 Non-pressure chronic ulcer of other part of left foot with necrosis of bone L97.511 Non-pressure chronic ulcer of other part of right foot limited to breakdown of skin L97.514 Non-pressure chronic ulcer of other part of right foot with necrosis of bone E11.42 Type 2 diabetes mellitus with diabetic polyneuropathy M14.671 Charcot's joint, right ankle and foot M14.672 Charcot's joint, left ankle and foot 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, left ankle and foot Facility Procedures CPT4 Code: [id number] Description: 11042 - DEB SUBQ TISSUE 20 SQ CM/< ICD-10 Diagnosis Description L97.511 Non-pressure chronic ulcer of other part of right foot limited to  breakdown of s Modifier: kin Quantity: 1 CPT4 Code: 47829562 Description: 97597 - DEBRIDE WOUND 1ST 20 SQ CM OR < ICD-10 Diagnosis Description L97.524 Non-pressure chronic ulcer of other part of left foot with necrosis of bone Modifier: 59 Quantity: 1 Physician Procedures : CPT4 Code Description Modifier [id number] 11042 - WC PHYS SUBQ TISS 20 SQ CM ICD-10 Diagnosis Description L97.511 Non-pressure chronic ulcer of other part of right foot limited to breakdown of skin Quantity: 1 : [id number] 97597 - WC PHYS DEBR WO ANESTH 20 SQ CM 59 ICD-10 Diagnosis Description L97.524 Non-pressure chronic ulcer of other part of left foot with necrosis of bone Quantity: 1 Electronic Signature(s) Signed: 07/15/2019 5:46:39 PM By: Baltazar Najjar MD Signed: 07/15/2019 5:46:39 PM By: Baltazar Najjar MD Entered By: Baltazar Najjar on 07/15/2019 15:12:18

## 2019-07-16 NOTE — Progress Notes (Addendum)
[name], [name] ([id number]) Visit Report for 07/15/2019 Arrival Information Details Patient Name: Date of Service: Sarah, [name] 07/15/2019 1:30 PM Medical Record Number: [id number] Patient Account Number: [id number] Date of Birth/Sex: Treating RN: 1955-11-26 (64 y.o. F) Dwiggins, Carollee Herter Primary Care Placida Cambre: MA SO UDI, [place] MA LSA DA T Other Clinician: Referring Coral Soler: Treating Jaimie Redditt/Extender: Baltazar Najjar MA SO UDI, ELHA MA LSA DA T Weeks in Treatment: 24 Visit Information History Since Last Visit Added or deleted any medications: No Patient Arrived: Crutches Any new allergies or adverse reactions: No Arrival Time: 13:27 Had a fall or experienced change in No Accompanied By: self activities of daily living that may affect Transfer Assistance: None risk of falls: Patient Identification Verified: Yes Signs or symptoms of abuse/neglect since No Secondary Verification Process Completed: Yes last visito Patient Requires Transmission-Based Precautions: No Hospitalized since last visit: No Patient Has Alerts: No Implantable device outside of the clinic No excluding cellular tissue based products placed in the center since last visit: Has Dressing in Place as Prescribed: Yes Has Compression in Place as Prescribed: Yes Has Footwear/Offloading in Place as Yes Prescribed: Left: T Contact Cast otal Right: Surgical Shoe with Pressure Relief Insole Pain Present Now: No Electronic Signature(s) Signed: 07/15/2019 5:58:35 PM By: [name] Entered By: [name] on 07/15/2019 13:27:57 -------------------------------------------------------------------------------- Compression Therapy Details Patient Name: Date of Service: Sarah Boss, [name] 07/15/2019 1:30 PM Medical Record Number: [id number] Patient Account Number: [id number] Date of Birth/Sex: Treating RN: 08-29-1955 (64 y.o. [name] Primary Care Seriyah Collison: MA SO UDI, Judene Companion MA LSA DA T Other  Clinician: Referring Tauriel Scronce: Treating Caroleen Stoermer/Extender: Baltazar Najjar MA SO UDI, ELHA MA LSA DA T Weeks in Treatment: 24 Compression Therapy Performed for Wound Assessment: Wound #9 Right,Medial Foot Performed By: Clinician Zandra Abts, RN Compression Type: Three Layer Post Procedure Diagnosis Same as Pre-procedure Electronic Signature(s) Signed: 07/15/2019 6:01:25 PM By: Zandra Abts RN, BSN Entered By: Zandra Abts on 07/15/2019 14:52:35 -------------------------------------------------------------------------------- Encounter Discharge Information Details Patient Name: Date of Service: Sarah CH, [name] 07/15/2019 1:30 PM Medical Record Number: [id number] Patient Account Number: [id number] Date of Birth/Sex: Treating RN: 06/10/1955 (64 y.o. [name] Primary Care Gleen Ripberger: MA SO UDI, [place] MA LSA DA T Other Clinician: Referring Chrissi Crow: Treating Malon Siddall/Extender: Baltazar Najjar MA SO UDI, ELHA MA LSA DA T Weeks in Treatment: 24 Encounter Discharge Information Items Post Procedure Vitals Discharge Condition: Stable Temperature (F): 98.5 Ambulatory Status: Crutches Pulse (bpm): 90 Discharge Destination: Home Respiratory Rate (breaths/min): 18 Transportation: Other Blood Pressure (mmHg): 144/85 Accompanied By: self Schedule Follow-up Appointment: Yes Clinical Summary of Care: Patient Declined Notes SCAT Electronic Signature(s) Signed: 07/16/2019 5:33:24 PM By: Zenaida Deed RN, BSN Entered By: Zenaida Deed on 07/15/2019 15:19:56 -------------------------------------------------------------------------------- Lower Extremity Assessment Details Patient Name: Date of Service: Sarah [facility], [name] 07/15/2019 1:30 PM Medical Record Number: [id number] Patient Account Number: [id number] Date of Birth/Sex: Treating RN: 22-Apr-1955 (64 y.o. F) [name] Primary Care Fenris Cauble: MA SO UDI, [place] MA LSA DA T Other Clinician: Referring  Tameisha Covell: Treating Janeka Libman/Extender: Baltazar Najjar MA SO UDI, ELHA MA LSA DA T Weeks in Treatment: 24 Edema Assessment Assessed: [Left: No] [Right: No] Edema: [Left: Yes] [Right: Yes] Calf Left: Right: Point of Measurement: 43 cm From Medial Instep 34 cm 35 cm Ankle Left: Right: Point of Measurement: 12 cm From Medial Instep 24 cm 22.5 cm Vascular Assessment Pulses: Dorsalis Pedis Palpable: [Left:Yes] [Right:Yes] Electronic Signature(s) Signed: 07/15/2019 5:58:35 PM By: [name] Entered By: [name]  on 07/15/2019 13:42:41 -------------------------------------------------------------------------------- Multi Wound Chart Details Patient Name: Date of Service: Sarah, [name] 07/15/2019 1:30 PM Medical Record Number: [id number] Patient Account Number: [id number] Date of Birth/Sex: Treating RN: 17-Sep-1955 (64 y.o. [name] Primary Care Hildred Mollica: MA SO UDI, [place] MA LSA DA T Other Clinician: Referring Carlton Buskey: Treating Amillya Chavira/Extender: Baltazar Najjar MA SO UDI, ELHA MA LSA DA T Weeks in Treatment: 24 Vital Signs Height(in): 68 Pulse(bpm): 90 Weight(lbs): 265 Blood Pressure(mmHg): 144/85 Body Mass Index(BMI): 40 Temperature(F): 98.5 Respiratory Rate(breaths/min): 18 Photos: [10:No Photos Left, Lateral, Plantar Foot] [9:No Photos Right, Medial Foot] [N/A:N/A N/A] Wound Location: [10:Blister] [9:Blister] [N/A:N/A] Wounding Event: [10:Diabetic Wound/Ulcer of the Lower] [9:Diabetic Wound/Ulcer of the Lower] [N/A:N/A] Primary Etiology: [10:Extremity Glaucoma, Chronic sinus] [9:Extremity Glaucoma, Chronic sinus] [N/A:N/A] Comorbid History: [10:problems/congestion, Anemia, Sleep Apnea, Hypertension, Type II Diabetes, Rheumatoid Arthritis, Neuropathy 11/18/2018] [9:problems/congestion, Anemia, Sleep Apnea, Hypertension, Type II Diabetes, Rheumatoid Arthritis, Neuropathy  06/18/2018] [N/A:N/A] Date Acquired: [10:24] [9:24] [N/A:N/A] Weeks of  Treatment: [10:Open] [9:Open] [N/A:N/A] Wound Status: [10:0.5x0.4x0.1] [9:2.1x2.2x0.7] [N/A:N/A] Measurements L x W x D (cm) [10:0.157] [9:3.629] [N/A:N/A] A (cm) : rea [10:0.016] [9:2.54] [N/A:N/A] Volume (cm) : [10:91.10%] [9:67.00%] [N/A:N/A] % Reduction in A [10:rea: 99.40%] [9:74.30%] [N/A:N/A] % Reduction in Volume: [10:Grade 2] [9:Grade 2] [N/A:N/A] Classification: [10:Medium] [9:Medium] [N/A:N/A] Exudate A mount: [10:Serosanguineous] [9:Serosanguineous] [N/A:N/A] Exudate Type: [10:red, brown] [9:red, brown] [N/A:N/A] Exudate Color: [10:Distinct, outline attached] [9:Well defined, not attached] [N/A:N/A] Wound Margin: [10:Large (67-100%)] [9:Large (67-100%)] [N/A:N/A] Granulation A mount: [10:Red, Pink] [9:Red, Pink] [N/A:N/A] Granulation Quality: [10:None Present (0%)] [9:None Present (0%)] [N/A:N/A] Necrotic A mount: [10:Fat Layer (Subcutaneous Tissue)] [9:Fat Layer (Subcutaneous Tissue)] [N/A:N/A] Exposed Structures: [10:Exposed: Yes Fascia: No Tendon: No Muscle: No Joint: No Bone: No Medium (34-66%)] [9:Exposed: Yes Fascia: No Tendon: No Muscle: No Joint: No Bone: No Small (1-33%)] [N/A:N/A] Epithelialization: [10:Debridement - Selective/Open Wound] [9:Debridement - Excisional] [N/A:N/A] Debridement: Pre-procedure Verification/Time Out 14:36 [9:14:36] [N/A:N/A] Taken: [10:Callus] [9:Callus, Subcutaneous] [N/A:N/A] Tissue Debrided: [10:Non-Viable Tissue] [9:Skin/Subcutaneous Tissue] [N/A:N/A] Level: [10:0.2] [9:4.62] [N/A:N/A] Debridement A (sq cm): [10:rea Curette] [9:Curette] [N/A:N/A] Instrument: [10:Minimum] [9:Moderate] [N/A:N/A] Bleeding: [10:Pressure] [9:Silver Nitrate] [N/A:N/A] Hemostasis A chieved: [10:0] [9:0] [N/A:N/A] Procedural Pain: [10:0] [9:0] [N/A:N/A] Post Procedural Pain: [10:Procedure was tolerated well] [9:Procedure was tolerated well] [N/A:N/A] Debridement Treatment Response: [10:0.5x0.4x0.1] [9:2.1x2.2x0.7] [N/A:N/A] Post Debridement Measurements L  x W x D (cm) [10:0.016] [9:2.54] [N/A:N/A] Post Debridement Volume: (cm) [10:Debridement] [9:Compression Therapy] [N/A:N/A] Procedures Performed: [10:T Contact Cast otal] [9:Debridement] Treatment Notes Electronic Signature(s) Signed: 07/15/2019 5:46:39 PM By: Baltazar Najjar MD Signed: 07/15/2019 6:01:25 PM By: Zandra Abts RN, BSN Entered By: Baltazar Najjar on 07/15/2019 15:07:44 -------------------------------------------------------------------------------- Multi-Disciplinary Care Plan Details Patient Name: Date of Service: [facility], Kla J. 07/15/2019 1:30 PM Medical Record Number: [id number] Patient Account Number: [id number] Date of Birth/Sex: Treating RN: 05-18-1955 (64 y.o. [name] Primary Care Kemia Wendel: MA SO UDI, Judene Companion MA LSA DA T Other Clinician: Referring Armstead Heiland: Treating Lenora Gomes/Extender: Baltazar Najjar MA SO UDI, ELHA MA LSA DA T Weeks in Treatment: 24 Active Inactive Wound/Skin Impairment Nursing Diagnoses: Impaired tissue integrity Knowledge deficit related to ulceration/compromised skin integrity Goals: Patient/caregiver will verbalize understanding of skin care regimen Date Initiated: 01/28/2019 Target Resolution Date: 08/09/2019 Goal Status: Active Interventions: Assess patient/caregiver ability to obtain necessary supplies Assess patient/caregiver ability to perform ulcer/skin care regimen upon admission and as needed Assess ulceration(s) every visit Provide education on ulcer and skin care Notes: Electronic Signature(s) Signed: 07/15/2019 6:01:25 PM By: Zandra Abts RN, BSN Entered By: Zandra Abts on 07/15/2019 17:14:51 --------------------------------------------------------------------------------  Pain Assessment Details Patient Name: Date of Service: CANYON, YANDO 07/15/2019 1:30 PM Medical Record Number: [id number] Patient Account Number: [id number] Date of Birth/Sex: Treating RN: Feb 01, 1955 (64 y.o. [name] Primary Care Merrick Maggio: MA SO UDI, [place] MA LSA DA T Other Clinician: Referring Carmelo Reidel: Treating Lyza Houseworth/Extender: Baltazar Najjar MA SO UDI, ELHA MA LSA DA T Weeks in Treatment: 24 Active Problems Location of Pain Severity and Description of Pain Patient Has Paino No Site Locations Pain Management and Medication Current Pain Management: Electronic Signature(s) Signed: 07/15/2019 5:58:35 PM By: [name] Entered By: [name] on 07/15/2019 13:31:41 -------------------------------------------------------------------------------- Patient/Caregiver Education Details Patient Name: Date of Service: Gypsy Decant 6/28/2021andnbsp1:30 PM Medical Record Number: [id number] Patient Account Number: [id number] Date of Birth/Gender: Treating RN: [birth year] (64 y.o. [name] Primary Care Physician: MA SO UDI, Judene Companion MA LSA DA T Other Clinician: Referring Physician: Treating Physician/Extender: Baltazar Najjar MA SO UDI, ELHA MA LSA DA T Weeks in Treatment: 62 Education Assessment Education Provided To: Patient Education Topics Provided Wound/Skin Impairment: Methods: Explain/Verbal Responses: State content correctly [employer]) Signed: 07/15/2019 6:01:25 PM By: Zandra Abts RN, BSN Entered By: Zandra Abts on 07/15/2019 17:15:03 -------------------------------------------------------------------------------- Wound Assessment Details Patient Name: Date of Service: Sarah CH, Anorah J. 07/15/2019 1:30 PM Medical Record Number: [id number] Patient Account Number: [id number] Date of Birth/Sex: Treating RN: 1955/08/16 (64 y.o. F) Dwiggins, Shannon Primary Care Stormy Connon: MA SO UDI, [place] MA LSA DA T Other Clinician: Referring Fredna Stricker: Treating Teonia Yager/Extender: Baltazar Najjar MA SO UDI, ELHA MA LSA DA T Weeks in Treatment: 24 Wound Status Wound Number: 10 Primary Diabetic Wound/Ulcer of the Lower Extremity Etiology: Wound  Location: Left, Lateral, Plantar Foot Wound Open Wounding Event: Blister Status: Date Acquired: 11/18/2018 Comorbid Glaucoma, Chronic sinus problems/congestion, Anemia, Sleep Weeks Of Treatment: 24 History: Apnea, Hypertension, Type II Diabetes, Rheumatoid Arthritis, Clustered Wound: No Neuropathy Photos Wound Measurements Length: (cm) 0.5 Width: (cm) 0.4 Depth: (cm) 0.1 Area: (cm) 0.157 Volume: (cm) 0.016 % Reduction in Area: 91.1% % Reduction in Volume: 99.4% Epithelialization: Medium (34-66%) Tunneling: No Undermining: No Wound Description Classification: Grade 2 Wound Margin: Distinct, outline attached Exudate Amount: Medium Exudate Type: Serosanguineous Exudate Color: red, brown Foul Odor After Cleansing: No Slough/Fibrino No Wound Bed Granulation Amount: Large (67-100%) Exposed Structure Granulation Quality: Red, Pink Fascia Exposed: No Necrotic Amount: None Present (0%) Fat Layer (Subcutaneous Tissue) Exposed: Yes Tendon Exposed: No Muscle Exposed: No Joint Exposed: No Bone Exposed: No Treatment Notes Wound #10 (Left, Lateral, Plantar Foot) 2. Periwound Care Skin Prep 3. Primary Dressing Applied Calcium Alginate Ag 4. Secondary Dressing Dry Gauze Foam 5. Secured With Tape 7. Footwear/Offloading device applied T Contact Cast otal Electronic Signature(s) Signed: 07/17/2019 5:27:00 PM By: Benjaman Kindler EMT/HBOT/SD Signed: 07/17/2019 5:29:07 PM By: [name] Previous Signature: 07/15/2019 5:58:35 PM Version By: [name] Entered By: Benjaman Kindler on 07/17/2019 15:18:33 -------------------------------------------------------------------------------- Wound Assessment Details Patient Name: Date of Service: Sarah CH, Quiera J. 07/15/2019 1:30 PM Medical Record Number: [id number] Patient Account Number: [id number] Date of Birth/Sex: Treating RN: [birth year] (64 y.o. F) [name] Primary Care Anastasiya Gowin: MA SO UDI, [place] MA LSA DA  T Other Clinician: Referring Brandice Busser: Treating Ajna Moors/Extender: Baltazar Najjar MA SO UDI, ELHA MA LSA DA T Weeks in Treatment: 24 Wound Status Wound Number: 9 Primary Diabetic Wound/Ulcer of the Lower Extremity Etiology: Wound Location: Right, Medial Foot Wound Open Wounding Event: Blister Status: Date Acquired: 06/18/2018 Comorbid Glaucoma, Chronic sinus problems/congestion, Anemia, Sleep Weeks Of Treatment: 24 History:  Apnea, Hypertension, Type II Diabetes, Rheumatoid Arthritis, Clustered Wound: No Neuropathy Photos Photo Uploaded By: Benjaman Kindler on 07/17/2019 15:19:42 Wound Measurements Length: (cm) 2.1 % R Width: (cm) 2.2 % R Depth: (cm) 0.7 Epi Area: (cm) 3.629 Tu Volume: (cm) 2.54 Un eduction in Area: 67% eduction in Volume: 74.3% thelialization: Small (1-33%) nneling: No dermining: No Wound Description Classification: Grade 2 Fou Wound Margin: Well defined, not attached Slo Exudate Amount: Medium Exudate Type: Serosanguineous Exudate Color: red, brown l Odor After Cleansing: No ugh/Fibrino No Wound Bed Granulation Amount: Large (67-100%) Exposed Structure Granulation Quality: Red, Pink Fascia Exposed: No Necrotic Amount: None Present (0%) Fat Layer (Subcutaneous Tissue) Exposed: Yes Tendon Exposed: No Muscle Exposed: No Joint Exposed: No Bone Exposed: No Treatment Notes Wound #9 (Right, Medial Foot) 3. Primary Dressing Applied Polymem Ag 4. Secondary Dressing ABD Pad Dry Gauze Other secondary dressing (specify in notes) 6. Support Layer Applied 3 layer compression wrap Notes zetuvit. Electronic Signature(s) Signed: 07/15/2019 5:58:35 PM By: [name] Entered By: [name] on 07/15/2019 13:53:18 -------------------------------------------------------------------------------- Vitals Details Patient Name: Date of Service: [facility], Gloris J. 07/15/2019 1:30 PM Medical Record Number: [id number] Patient Account Number:  [id number] Date of Birth/Sex: Treating RN: 02/10/1955 (64 y.o. F) Dwiggins, Shannon Primary Care Talbert Trembath: MA SO UDI, [place] MA LSA DA T Other Clinician: Referring Chrisy Hillebrand: Treating Nivedita Mirabella/Extender: Baltazar Najjar MA SO UDI, ELHA MA LSA DA T Weeks in Treatment: 24 Vital Signs Time Taken: 13:28 Temperature (F): 98.5 Height (in): 68 Pulse (bpm): 90 Weight (lbs): 265 Respiratory Rate (breaths/min): 18 Body Mass Index (BMI): 40.3 Blood Pressure (mmHg): 144/85 Reference Range: 80 - 120 mg / dl Electronic Signature(s) Signed: 07/15/2019 5:58:35 PM By: [name] Entered By: [name] on 07/15/2019 13:31:32

## 2019-07-23 ENCOUNTER — Encounter (HOSPITAL_BASED_OUTPATIENT_CLINIC_OR_DEPARTMENT_OTHER): Payer: Medicare Other | Attending: Internal Medicine | Admitting: Internal Medicine

## 2019-07-23 DIAGNOSIS — L97511 Non-pressure chronic ulcer of other part of right foot limited to breakdown of skin: Secondary | ICD-10-CM | POA: Insufficient documentation

## 2019-07-23 DIAGNOSIS — Z8614 Personal history of Methicillin resistant Staphylococcus aureus infection: Secondary | ICD-10-CM | POA: Insufficient documentation

## 2019-07-23 DIAGNOSIS — L97514 Non-pressure chronic ulcer of other part of right foot with necrosis of bone: Secondary | ICD-10-CM | POA: Insufficient documentation

## 2019-07-23 DIAGNOSIS — E11621 Type 2 diabetes mellitus with foot ulcer: Secondary | ICD-10-CM | POA: Diagnosis not present

## 2019-07-23 DIAGNOSIS — I129 Hypertensive chronic kidney disease with stage 1 through stage 4 chronic kidney disease, or unspecified chronic kidney disease: Secondary | ICD-10-CM | POA: Insufficient documentation

## 2019-07-23 DIAGNOSIS — N183 Chronic kidney disease, stage 3 unspecified: Secondary | ICD-10-CM | POA: Insufficient documentation

## 2019-07-23 DIAGNOSIS — E1142 Type 2 diabetes mellitus with diabetic polyneuropathy: Secondary | ICD-10-CM | POA: Insufficient documentation

## 2019-07-23 DIAGNOSIS — E1122 Type 2 diabetes mellitus with diabetic chronic kidney disease: Secondary | ICD-10-CM | POA: Insufficient documentation

## 2019-07-23 DIAGNOSIS — L97524 Non-pressure chronic ulcer of other part of left foot with necrosis of bone: Secondary | ICD-10-CM | POA: Insufficient documentation

## 2019-07-23 DIAGNOSIS — E11610 Type 2 diabetes mellitus with diabetic neuropathic arthropathy: Secondary | ICD-10-CM | POA: Diagnosis not present

## 2019-07-23 DIAGNOSIS — M86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, left ankle and foot: Secondary | ICD-10-CM | POA: Diagnosis not present

## 2019-07-23 DIAGNOSIS — G4733 Obstructive sleep apnea (adult) (pediatric): Secondary | ICD-10-CM | POA: Insufficient documentation

## 2019-07-23 NOTE — Progress Notes (Signed)
Sarah, [name] ([id number]) Visit Report for 07/23/2019 Debridement Details Patient Name: Date of Service: Sarah, [name] 07/23/2019 1:30 PM Medical Record Number: [id number] Patient Account Number: [id number] Date of Birth/Sex: Treating RN: 1955/04/13 (64 y.o. [name] Primary Care Provider: MA SO UDI, [place] MA LSA DA [name] Other Clinician: Referring Provider: Treating Provider/Extender: Baltazar Najjar MA SO UDI, ELHA MA LSA DA [name] Weeks in Treatment: 25 Debridement Performed for Assessment: Wound #9 Right,Medial Foot Performed By: Physician [name], MD Debridement Type: Debridement Severity of Tissue Pre Debridement: Fat layer exposed Level of Consciousness (Pre-procedure): Awake and Alert Pre-procedure Verification/Time Out Yes - 14:45 Taken: Start Time: 14:45 [name] Area Debrided (L x W): otal 2.5 (cm) x 2.2 (cm) = 5.5 (cm) Tissue and other material debrided: Viable, Non-Viable, Callus, Subcutaneous Level: Skin/Subcutaneous Tissue Debridement Description: Excisional Instrument: Curette Bleeding: Minimum Hemostasis Achieved: Pressure End Time: 14:46 Procedural Pain: 0 Post Procedural Pain: 0 Response to Treatment: Procedure was tolerated well Level of Consciousness (Post- Awake and Alert procedure): Post Debridement Measurements of Total Wound Length: (cm) 2.5 Width: (cm) 2.2 Depth: (cm) 0.3 Volume: (cm) 1.296 Character of Wound/Ulcer Post Debridement: Improved Severity of Tissue Post Debridement: Fat layer exposed Post Procedure Diagnosis Same as Pre-procedure Electronic Signature(s) Signed: 07/23/2019 5:09:59 PM By: Baltazar Najjar MD Signed: 07/23/2019 5:26:11 PM By: Zandra Abts RN, BSN Entered By: Baltazar Najjar on 07/23/2019 14:55:08 -------------------------------------------------------------------------------- HPI Details Patient Name: Date of Service: [name], [name] 07/23/2019 1:30 PM Medical Record Number: [id number] Patient Account  Number: [id number] Date of Birth/Sex: Treating RN: Jul 26, 1955 (64 y.o. [name] Primary Care Provider: MA SO UDI, [place] MA LSA DA [name] Other Clinician: Referring Provider: Treating Provider/Extender: Baltazar Najjar MA SO UDI, ELHA MA LSA DA [name] Weeks in Treatment: 25 History of Present Illness HPI Description: 64 yrs old bf here for follow up recurrent left charcot foot ulcer. She has DM. She is not a smoker. She states a blister developed 3 months ago at site of previous breakdown from 1 year ago. It was debrided at the podiatrist's office. She went to the ER and then sent here. She denies pain. Xray was negative for osteo. Culture from this clinic negative. 09/08/14 [address] [name] to [name] for Charcot foot. Seen by him and MRI scheduled 09/19/14. Prior silver collagen but this was not ordered last visit, patient has been rinsing with saline alone. Re institute silver collagen every other day. F/u 2 weeks with [name] as Labor Day holiday. Supplies ordered. 09/25/14; this is a patient with a Charcot foot on the left. she has a wound over the plantar aspect of her left calcaneus. She has been seen by [name] of orthopedic surgery and has had an MRI. She is apparently going within the next week or 2 for corrective surgery which will involve an external fixator. I don'[name] have any of the details of this. 10/06/14; The patient is a 64 yrs old bf with a left Charcot foot and ulcer on the plantar. 12/01/14 Returns post surgery with [name]. Has follow up visit later this week with him, currently in facility and NWB over this extremity. States no drainage from foot and no current wound care. On exam callus and scab in place, suspect healed. Has external fixator in place. Will defer to [name] for management, ok to place moisturizing cream over scabs and callus and she will call if she requires follow up here. READMISSION 01/28/2019 Patient was in the  clinic in 2016 for a wound  on the plantar aspect of her left calcaneus. Predominantly cared for by [name] she was referred to [name] and had corrective surgery for the position of her left ankle I believe. She had previously been here in 2015 and then prior to that in 2011 2012 that I do not have these records. More recently she has developed fairly extensive wounds on the right medial foot, left lateral foot and a small area on the right medial great toe. Right medial foot wound has been there for 6 to 7 months, left foot wound for 2 to 3 months and the right great toe only over the last few weeks. She has been followed by [name] at Dr. Laverta [place] office it sounds as though she has been undergoing callus paring and silver nitrate. The patient has been washing these off with soap and water and plan applying dry dressing Past medical history, type 2 diabetes well controlled with a recent hemoglobin A1c of 7 on 11/16, type 2 diabetes with peripheral neuropathy, previous left Charcot joint surgery bilateral Charcot joints currently, stage III chronic renal failure, hypertension, obstructive sleep apnea, history of MRSA and gastroesophageal reflux. 1/18; this is a patient with bilateral Charcot feet. Plain x-rays that I did of both of these wounds that are either at bone or precariously close to bone were done. On the right foot this showed possible lytic destruction involving the anterior aspect of the talus which may represent a wound osteomyelitis. An MRI was recommended. On the left there was no definite lytic destruction although the wound is deep undermining's and I think probably requires an MRI on this basis in any case 1/25; patient was supposed to have her MRI last Friday of her bilateral feet however they would not do it because there was silver alginate in her dressings, will replace with calcium alginate today and will see if we can get her done today 2/1; patient did not get her MRIs done last week  because of issues with the MRI department receiving orders to do bilateral feet. She has 2 deep wounds in the setting of Charcot feet deformities bilaterally. Both of them at one point had exposed bone although I had trouble demonstrating that today. I am not sure that we can offload these very aggressively as I watched her leave the clinic last week her gait is very narrow and unsteady. 2/15; the MRI of her right foot did not show osteomyelitis potentially osteonecrosis of the talus. 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 The wounds are a large wound on the right medial foot and on the left a substantial wound on the left lateral plantar calcaneus. We have been using silver alginate. Ultimately I think we need to consider a total contact cast on the right while we work through the possibility of osteomyelitis in the left. I watched her walk out with her crutches I have some trepidation about the ability to walk with the cast on her right foot. Nevertheless with her Charcot deformities I do not think there is a way to heal these short of offloading them aggressively. 2/22; the culture of the bone in the left foot that I did last week showed a few Citrobacter Koseri as well as some streptococcal species. In my attempted bone for pathology did not actually yield a lot of bone the pathologist did not identify any osteomyelitis. Nevertheless based on the MRI, bone for culture  and the probing wound into the bone itself I think this woman has osteomyelitis in the left foot and we will refer her to infectious disease. Is promised today and aggressive debridement of the right foot and a total contact cast which surprisingly she seemed to tolerate quite well 03/13/2019 upon evaluation today patient appears to be doing well with her total contact cast she is here for the first cast change. She had no areas of rubbing or any other complications at this point.  Overall things are doing quite well. 2/26; the patient was kindly seen by Dr. Orvan Falconer of infectious disease on 2/25. She is now on IV vancomycin PICC line placement in 2 days and oral Flagyl. This was in response to her MRI showing osteomyelitis of the left calcaneus concerning for osteomyelitis. We have been putting her right leg in a total contact cast. Our nurses report drainage. She is tolerating the total contact cast well. 3/4; the patient is started her IV antibiotics and is taking IV vancomycin and oral Flagyl. She appears to be tolerating these both well. This is for osteomyelitis involving the left calcaneus. The area on the right in the setting of bilateral Charcot deformities did not have any bone infection on MRI. We put a total contact cast on her with silver alginate as the primary dressing and she returns today in follow-up. Per our intake nurse there was too much drainage to leave this on all week therefore we will bring her back for an early change 3/8; follow-up total contact cast she is still having a lot of drainage probably too much drainage from the right foot wound will week with the same cast. She will be back on Thursday. The area on the left looks somewhat better 3/11; patient here for a total contact cast change. 3/15; patient came in for wound care evaluation. She is still on IV vancomycin and oral Flagyl for osteomyelitis in the left foot. The area on the right foot we are putting in a total contact cast. 3/18 for total contact cast change on the right foot 3/22; the patient was here for wound review today. The area on the right foot is slightly smaller in terms of surface area. However the surface of the wound is very gritty and hyper granulated. More problematically the area on the plantar left foot has increased depth indeed in the center of this it probes to bone. We have been using Hydrofera Blue in both areas. She is on antibiotics as prescribed by infectious  disease IV vancomycin and oral Flagyl 3/25; the patient is in for total contact cast change. Our intake nurse was concerned about the amount of drainage which soaked right to the multiple layers we are putting on this. Wondered about options. The wound bed actually looks as healthy as this is looked although there may not be a lot of change in dimensions things are looking a lot better. If we are going to heal this woman's wound which is in the middle of her right Charcot foot this is going to need to be offloaded. There are not many alternatives 3/29; still not a lot of improvement although the surface of the right wound looks better. We have been using Hydrofera Blue 04/17/2019 upon evaluation today patient appears to be doing well with a total contact cast. With actually having to change this twice a week. She saw [name] for wound care earlier in the week this is for the cast change today. That is due  to the fact that she has significant drainage. Overall the wound is been looking excellent however. 4/5; we continue to put a total contact cast on this patient with Hydrofera Blue. Still a lot of drainage which precludes a simple weekly change or changing this twice a week. 4/8; total contact cast changed today. Apparently the wound looks better per our intake nurse 4/12; patient here for wound care evaluation. Wounds on the right foot have not changed in dimension none about a month left foot looks similar. Apparently her antibiotics that we are giving her for osteomyelitis in the left foot complete on Wednesday. We have been using Hydrofera Blue on the right foot under a total contact cast. Nurses report greenish drainage although I think this may be discoloration from the [facility] 4/15; back for cast change wounds look quite good although still moderate amount of drainage. [name] contact cast reapplied otal 4/19;. Wound evaluation. Wound on the right foot is smaller surface looks healthy.  There is still the divot in the middle part of this wound but I could not feel any bone. Area on the left foot also looks better She has completed her antibiotics but still has the PICC line in. 4/23; patient comes back for a total contact cast change this was done in the standard fashion no issues 4/26; wound measures slightly larger on the right foot which is disappointing. She sees Dr. Orvan Falconer with regards to her antibiotics on Wednesday. I believe she is on vancomycin IV and oral Flagyl. 4/29; in for her routine total contact cast change. She saw Dr. Orvan Falconer this morning. He is asking about antibiotic continuation I will be in touch with him. I did not look at her wound today 5/3; patient saw Dr. Orvan Falconer on 4/28. I did send him a note asking about the continuation of perhaps an oral regimen. I have not heard back. She still has an elevated sedimentation rate at 81. Her wounds are smaller. 5/6; in for her total contact cast change. We did not look at the wound today. She has seen Dr. Orvan Falconer and is now on oral Keflex and Flagyl. She seems to be tolerating these well 5/10; she had her PICC line removed. The wound on the right foot after some initial improvement has not made any improvement even with the total contact cast. Still having a lot of drainage. Likewise the area on the left foot really is not any better either 5/14; in for a cast change today. We will look at the right foot on Monday. If this is not making any progress I am going to try to cast the left foot. 5/17; the wound on the right foot is absolutely no different. I am going to put ointment on this area and change the cast of the left foot. We have been using silver alginate there 5/21; apparently the area on the right foot was better per our intake nurse although I did not see it. We did a standard total contact cast change on the left foot we have been applying silver alginate to the wound here. 06/10/19-Area on the right  foot per dimensions slightly better, the left foot has still some depth applying silver alginate to the wound with TCC 06/13/19-Patient here for Casting to Left foot 6/1; patient had the total contact cast were placed on the left foot. Noted some swelling in her right leg although the dimensions of the wound on the right foot are better. We have been using PolyMem on the  right and silver alginate on the left under the cast 6/7; we continue to make nice progress in both her plantar feet wound. We are putting the total contact cast on the left. Surprisingly in spite of this the area on the right medial foot in the middle of her Charcot deformities actually is making progress as well 07/01/2019 upon evaluation today patient actually appears to be doing quite well with regard to her wounds on the feet compared to when I saw her last that has been quite some time. With that being said I do believe the total contact cast has been beneficial in this left foot and she has a lot of signs of improvement she does have some callus overhanging which is caused a little bit of a ledge and an issue here. I do believe that we may be able to do something to help in that regard. Fortunately otherwise the wound seems to be progressing quite nicely. 6/21; the patient's area on the left foot that we are currently casting is just about closed. The area on the right medial foot has a healthy looking surface but may be not changed as much from last week in terms of surface area. We are using silver alginate on the right and polymen Ag under the cast 6/28; only a small open area on the left foot remains. Using silver alginate under a total contact cast. She has a diabetic shoe I have asked her to bring that for next week where this may be closed. The area on the right foot with a Charcot deformity is also measuring slightly smaller. We are using PolyMem Ag here 7/6; left foot is totally closed. She does not require a total  contact cast. She will graduate to her own diabetic shoe. The area on the right foot with a Charcot deformity measuring about the same. We have been using polymen under compression wraps. She did not do well in this area with a total contact cast Electronic Signature(s) Signed: 07/23/2019 5:09:59 PM By: Baltazar Najjar MD Entered By: Baltazar Najjar on 07/23/2019 14:55:55 -------------------------------------------------------------------------------- Physical Exam Details Patient Name: Date of Service: [name], [name] 07/23/2019 1:30 PM Medical Record Number: [id number] Patient Account Number: [id number] Date of Birth/Sex: Treating RN: [birth year] (64 y.o. [name] Primary Care Provider: MA Hedy Jacob MA LSA DA [name] Other Clinician: Referring Provider: Treating Provider/Extender: Baltazar Najjar MA SO UDI, ELHA MA LSA DA [name] Weeks in Treatment: 25 Constitutional Patient is hypertensive.. Pulse regular and within target range for patient.Marland Kitchen Respirations regular, non-labored and within target range.. Temperature is normal and within the target range for the patient.Marland Kitchen Appears in no distress. Cardiovascular The pulses are palpable. Notes Wound exam On the left foot the area is totally closed and looks healthy. She will not need a total contact cast nor any specific dressing On the right foot in the middle of the Charcot deformity very significant callus and senescent edges around the wound debris on the wound surface all debrided with a #5 curette hemostasis with direct pressure Electronic Signature(s) Signed: 07/23/2019 5:09:59 PM By: Baltazar Najjar MD Entered By: Baltazar Najjar on 07/23/2019 14:58:38 -------------------------------------------------------------------------------- Physician Orders Details Patient Name: Date of Service: [name], [name] 07/23/2019 1:30 PM Medical Record Number: [id number] Patient Account Number: [id number] Date of Birth/Sex: Treating  RN: February 20, 1955 (64 y.o. [name] Primary Care Provider: MA SO UDI, [name] Other Clinician: Referring Provider: Treating Provider/Extender: Melissa Noon MA  LSA DA [name] Weeks in Treatment: 25 Verbal / Phone Orders: No Diagnosis Coding ICD-10 Coding Code Description E11.621 Type 2 diabetes mellitus with foot ulcer L97.524 Non-pressure chronic ulcer of other part of left foot with necrosis of bone L97.511 Non-pressure chronic ulcer of other part of right foot limited to breakdown of skin L97.514 Non-pressure chronic ulcer of other part of right foot with necrosis of bone E11.42 Type 2 diabetes mellitus with diabetic polyneuropathy M14.671 Charcot's joint, right ankle and foot M14.672 Charcot's joint, left ankle and foot 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 Dressing Change Frequency Wound #9 Right,Medial Foot Do not change entire dressing for one week. Wound Cleansing Wound #9 Right,Medial Foot May shower with protection. - use cast protector Primary Wound Dressing Wound #9 Right,Medial Foot Polymem Silver Secondary Dressing Wound #9 Right,Medial Foot Foam - foam donut Dry Gauze ABD pad Zetuvit or Kerramax Edema Control 3 Layer Compression System - Right Lower Extremity Avoid standing for long periods of time Elevate legs to the level of the heart or above for 30 minutes daily and/or when sitting, a frequency of: - throughout the day Exercise regularly Off-Loading Open toe surgical shoe to: - right foot Electronic Signature(s) Signed: 07/23/2019 5:09:59 PM By: Baltazar Najjar MD Signed: 07/23/2019 5:26:11 PM By: Zandra Abts RN, BSN Entered By: Zandra Abts on 07/23/2019 14:50:28 -------------------------------------------------------------------------------- Problem List Details Patient Name: Date of Service: [name], Vergia J. 07/23/2019 1:30 PM Medical Record  Number: [id number] Patient Account Number: [id number] Date of Birth/Sex: Treating RN: [birth year] (64 y.o. [name] Primary Care Provider: MA SO UDI, [name] Other Clinician: Referring Provider: Treating Provider/Extender: Abe People, ELHA MA LSA DA [name] Weeks in Treatment: 25 Active Problems ICD-10 Encounter Code Description Active Date MDM Diagnosis E11.621 Type 2 diabetes mellitus with foot ulcer 01/28/2019 No Yes L97.524 Non-pressure chronic ulcer of other part of left foot with necrosis of bone 01/28/2019 No Yes L97.511 Non-pressure chronic ulcer of other part of right foot limited to breakdown of 01/28/2019 No Yes skin L97.514 Non-pressure chronic ulcer of other part of right foot with necrosis of bone 01/28/2019 No Yes E11.42 Type 2 diabetes mellitus with diabetic polyneuropathy 01/28/2019 No Yes M14.671 Charcot's joint, right ankle and foot 01/28/2019 No Yes M14.672 Charcot's joint, left ankle and foot 01/28/2019 No Yes 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 07/23/2019 1:30 PM Medical Record Number: [id number] Patient Account Number: [id number] Date of Birth/Sex: Treating RN: 1955-09-03 (64 y.o. [name] Primary Care Provider: Other Clinician: MA SO UDI, Carson Myrtle LSA DA [name] Referring Provider: Treating Provider/Extender: Baltazar Najjar MA SO UDI, ELHA MA LSA DA [name] Weeks in Treatment: 25 Subjective History of Present Illness (HPI) 64 yrs old bf here for follow up recurrent left charcot foot ulcer. She has DM. She is not a smoker. She states a blister developed 3 months ago at site of previous  breakdown from 1 year ago. It was debrided at the podiatrist's office. She went to the ER and then sent here. She denies pain. Xray was negative for osteo. Culture from this clinic negative. 09/08/14 Referred by [name] to [name] for Charcot foot. Seen by him and MRI scheduled 09/19/14.  Prior silver collagen but this was not ordered last visit, patient has been rinsing with saline alone. Re institute silver collagen every other day. F/u 2 weeks with [name] as Labor Day holiday. Supplies ordered. 09/25/14; this is a patient with a Charcot foot on the left. she has a wound over the plantar aspect of her left calcaneus. She has been seen by [name] of orthopedic surgery and has had an MRI. She is apparently going within the next week or 2 for corrective surgery which will involve an external fixator. I don'[name] have any of the details of this. 10/06/14; The patient is a 64 yrs old bf with a left Charcot foot and ulcer on the plantar. 12/01/14 Returns post surgery with [name]. Has follow up visit later this week with him, currently in facility and NWB over this extremity. States no drainage from foot and no current wound care. On exam callus and scab in place, suspect healed. Has external fixator in place. Will defer to [name] for management, ok to place moisturizing cream over scabs and callus and she will call if she requires follow up here. READMISSION 01/28/2019 Patient was in the clinic in 2016 for a wound on the plantar aspect of her left calcaneus. Predominantly cared for by [name] she was referred to [name] and had corrective surgery for the position of her left ankle I believe. She had previously been here in 2015 and then prior to that in 2011 2012 that I do not have these records. More recently she has developed fairly extensive wounds on the right medial foot, left lateral foot and a small area on the right medial great toe. Right medial foot wound has been there for  6 to 7 months, left foot wound for 2 to 3 months and the right great toe only over the last few weeks. She has been followed by [name] at Dr. Laverta [place] office it sounds as though she has been undergoing callus paring and silver nitrate. The patient has been washing these off with soap and water and plan applying dry dressing Past medical history, type 2 diabetes well controlled with a recent hemoglobin A1c of 7 on 11/16, type 2 diabetes with peripheral neuropathy, previous left Charcot joint surgery bilateral Charcot joints currently, stage III chronic renal failure, hypertension, obstructive sleep apnea, history of MRSA and gastroesophageal reflux. 1/18; this is a patient with bilateral Charcot feet. Plain x-rays that I did of both of these wounds that are either at bone or precariously close to bone were done. On the right foot this showed possible lytic destruction involving the anterior aspect of the talus which may represent a wound osteomyelitis. An MRI was recommended. On the left there was no definite lytic destruction although the wound is deep undermining's and I think probably requires an MRI on this basis in any case 1/25; patient was supposed to have her MRI last Friday of her bilateral feet however they would not do it because there was silver alginate in her dressings, will replace with calcium alginate today and will see if we can get her done today 2/1; patient did not get her MRIs done last week because of issues with the MRI department receiving orders to do bilateral feet. She has 2 deep wounds in the setting of Charcot feet deformities bilaterally. Both of them at one point had exposed bone although I had trouble demonstrating that today. I am not sure that we can offload these very aggressively  as I watched her leave the clinic last week her gait is very narrow and unsteady. 2/15; the MRI of her right foot did not show osteomyelitis potentially osteonecrosis of the talus.  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 The wounds are a large wound on the right medial foot and on the left a substantial wound on the left lateral plantar calcaneus. We have been using silver alginate. Ultimately I think we need to consider a total contact cast on the right while we work through the possibility of osteomyelitis in the left. I watched her walk out with her crutches I have some trepidation about the ability to walk with the cast on her right foot. Nevertheless with her Charcot deformities I do not think there is a way to heal these short of offloading them aggressively. 2/22; the culture of the bone in the left foot that I did last week showed a few Citrobacter Koseri as well as some streptococcal species. In my attempted bone for pathology did not actually yield a lot of bone the pathologist did not identify any osteomyelitis. 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 Is promised today and aggressive debridement of the right foot and a total contact cast which surprisingly she seemed to tolerate quite well 03/13/2019 upon evaluation today patient appears to be doing well with her total contact cast she is here for the first cast change. She had no areas of rubbing or any other complications at this point. Overall things are doing quite well. 2/26; the patient was kindly seen by Dr. Orvan Falconer of infectious disease on 2/25. She is now on IV vancomycin PICC line placement in 2 days and oral Flagyl. This was in response to her MRI showing osteomyelitis of the left calcaneus concerning for osteomyelitis. We have been putting her right leg in a total contact cast. Our nurses report drainage. She is tolerating the total contact cast well. 3/4; the patient is started her IV antibiotics and is  taking IV vancomycin and oral Flagyl. She appears to be tolerating these both well. This is for osteomyelitis involving the left calcaneus. The area on the right in the setting of bilateral Charcot deformities did not have any bone infection on MRI. We put a total contact cast on her with silver alginate as the primary dressing and she returns today in follow-up. Per our intake nurse there was too much drainage to leave this on all week therefore we will bring her back for an early change 3/8; follow-up total contact cast she is still having a lot of drainage probably too much drainage from the right foot wound will week with the same cast. She will be back on Thursday. The area on the left looks somewhat better 3/11; patient here for a total contact cast change. 3/15; patient came in for wound care evaluation. She is still on IV vancomycin and oral Flagyl for osteomyelitis in the left foot. The area on the right foot we are putting in a total contact cast. 3/18 for total contact cast change on the right foot 3/22; the patient was here for wound review today. The area on the right foot is slightly smaller in terms of surface area. However the surface of the wound is very gritty and hyper granulated. More problematically the area on the  plantar left foot has increased depth indeed in the center of this it probes to bone. We have been using Hydrofera Blue in both areas. She is on antibiotics as prescribed by infectious disease IV vancomycin and oral Flagyl 3/25; the patient is in for total contact cast change. Our intake nurse was concerned about the amount of drainage which soaked right to the multiple layers we are putting on this. Wondered about options. The wound bed actually looks as healthy as this is looked although there may not be a lot of change in dimensions things are looking a lot better. If we are going to heal this woman's wound which is in the middle of her right Charcot foot this is  going to need to be offloaded. There are not many alternatives 3/29; still not a lot of improvement although the surface of the right wound looks better. We have been using Hydrofera Blue 04/17/2019 upon evaluation today patient appears to be doing well with a total contact cast. With actually having to change this twice a week. She saw [name] for wound care earlier in the week this is for the cast change today. That is due to the fact that she has significant drainage. Overall the wound is been looking excellent however. 4/5; we continue to put a total contact cast on this patient with Hydrofera Blue. Still a lot of drainage which precludes a simple weekly change or changing this twice a week. 4/8; total contact cast changed today. Apparently the wound looks better per our intake nurse 4/12; patient here for wound care evaluation. Wounds on the right foot have not changed in dimension none about a month left foot looks similar. Apparently her antibiotics that we are giving her for osteomyelitis in the left foot complete on Wednesday. We have been using Hydrofera Blue on the right foot under a total contact cast. Nurses report greenish drainage although I think this may be discoloration from the [facility] 4/15; back for cast change wounds look quite good although still moderate amount of drainage. [name] contact cast reapplied otal 4/19;. Wound evaluation. Wound on the right foot is smaller surface looks healthy. There is still the divot in the middle part of this wound but I could not feel any bone. Area on the left foot also looks better She has completed her antibiotics but still has the PICC line in. 4/23; patient comes back for a total contact cast change this was done in the standard fashion no issues 4/26; wound measures slightly larger on the right foot which is disappointing. She sees Dr. Orvan Falconer with regards to her antibiotics on Wednesday. I believe she is on vancomycin IV and oral  Flagyl. 4/29; in for her routine total contact cast change. She saw Dr. Orvan Falconer this morning. He is asking about antibiotic continuation I will be in touch with him. I did not look at her wound today 5/3; patient saw Dr. Orvan Falconer on 4/28. I did send him a note asking about the continuation of perhaps an oral regimen. I have not heard back. She still has an elevated sedimentation rate at 81. Her wounds are smaller. 5/6; in for her total contact cast change. We did not look at the wound today. She has seen Dr. Orvan Falconer and is now on oral Keflex and Flagyl. She seems to be tolerating these well 5/10; she had her PICC line removed. The wound on the right foot after some initial improvement has not made any improvement even with the total  contact cast. Still having a lot of drainage. Likewise the area on the left foot really is not any better either 5/14; in for a cast change today. We will look at the right foot on Monday. If this is not making any progress I am going to try to cast the left foot. 5/17; the wound on the right foot is absolutely no different. I am going to put ointment on this area and change the cast of the left foot. We have been using silver alginate there 5/21; apparently the area on the right foot was better per our intake nurse although I did not see it. We did a standard total contact cast change on the left foot we have been applying silver alginate to the wound here. 06/10/19-Area on the right foot per dimensions slightly better, the left foot has still some depth applying silver alginate to the wound with TCC 06/13/19-Patient here for Casting to Left foot 6/1; patient had the total contact cast were placed on the left foot. Noted some swelling in her right leg although the dimensions of the wound on the right foot are better. We have been using PolyMem on the right and silver alginate on the left under the cast 6/7; we continue to make nice progress in both her plantar feet  wound. We are putting the total contact cast on the left. Surprisingly in spite of this the area on the right medial foot in the middle of her Charcot deformities actually is making progress as well 07/01/2019 upon evaluation today patient actually appears to be doing quite well with regard to her wounds on the feet compared to when I saw her last that has been quite some time. With that being said I do believe the total contact cast has been beneficial in this left foot and she has a lot of signs of improvement she does have some callus overhanging which is caused a little bit of a ledge and an issue here. I do believe that we may be able to do something to help in that regard. Fortunately otherwise the wound seems to be progressing quite nicely. 6/21; the patient's area on the left foot that we are currently casting is just about closed. The area on the right medial foot has a healthy looking surface but may be not changed as much from last week in terms of surface area. We are using silver alginate on the right and polymen Ag under the cast 6/28; only a small open area on the left foot remains. Using silver alginate under a total contact cast. She has a diabetic shoe I have asked her to bring that for next week where this may be closed. The area on the right foot with a Charcot deformity is also measuring slightly smaller. We are using PolyMem Ag here 7/6; left foot is totally closed. She does not require a total contact cast. She will graduate to her own diabetic shoe. ooThe area on the right foot with a Charcot deformity measuring about the same. We have been using polymen under compression wraps. She did not do well in this area with a total contact cast Objective Constitutional Patient is hypertensive.. Pulse regular and within target range for patient.Marland Kitchen Respirations regular, non-labored and within target range.. Temperature is normal and within the target range for the patient.Marland Kitchen Appears in no  distress. Vitals Time Taken: 1:41 PM, Height: 68 in, Weight: 265 lbs, BMI: 40.3, Temperature: 97.6 F, Pulse: 80 bpm, Respiratory Rate: 18 breaths/min, Blood  Pressure: 153/72 mmHg. Cardiovascular The pulses are palpable. General Notes: Wound exam ooOn the left foot the area is totally closed and looks healthy. She will not need a total contact cast nor any specific dressing ooOn the right foot in the middle of the Charcot deformity very significant callus and senescent edges around the wound debris on the wound surface all debrided with a #5 curette hemostasis with direct pressure Integumentary (Hair, Skin) Wound #10 status is Healed - Epithelialized. Original cause of wound was Blister. The wound is located on the Left,Lateral,Plantar Foot. The wound measures 0cm length x 0cm width x 0cm depth; 0cm^2 area and 0cm^3 volume. There is no tunneling or undermining noted. There is a none present amount of drainage noted. The wound margin is distinct with the outline attached to the wound base. There is no granulation within the wound bed. There is no necrotic tissue within the wound bed. Wound #9 status is Open. Original cause of wound was Blister. The wound is located on the Right,Medial Foot. The wound measures 2.5cm length x 2.2cm width x 0.3cm depth; 4.32cm^2 area and 1.296cm^3 volume. There is Fat Layer (Subcutaneous Tissue) Exposed exposed. There is no tunneling or undermining noted. There is a medium amount of serosanguineous drainage noted. The wound margin is well defined and not attached to the wound base. There is large (67- 100%) red, pink granulation within the wound bed. There is no necrotic tissue within the wound bed. 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 There was a Excisional Skin/Subcutaneous Tissue Debridement with a total area of 5.5 sq cm performed by [name], MD. With the following instrument(s): Curette to remove Viable and Non-Viable tissue/material. Material removed includes Callus and Subcutaneous Tissue and. No specimens were taken. A time out was conducted at 14:45, prior to the start of the procedure. A Minimum amount of bleeding was controlled with Pressure. The procedure was tolerated well with a pain level of 0 throughout and a pain level of 0 following the procedure. Post Debridement Measurements: 2.5cm length x 2.2cm width x 0.3cm depth; 1.296cm^3 volume. Character of Wound/Ulcer Post Debridement is improved. Severity of Tissue Post Debridement is: Fat layer exposed. Post procedure Diagnosis Wound #9: Same as Pre-Procedure Pre-procedure diagnosis of Wound #9 is a Diabetic Wound/Ulcer of the Lower Extremity located on the Right,Medial Foot . There was a Three Layer Compression Therapy Procedure by Zandra Abts, RN. Post procedure Diagnosis Wound #9: Same as Pre-Procedure Plan Follow-up Appointments: Return Appointment in 1 week. Dressing Change Frequency: Wound #9 Right,Medial Foot: Do not change entire dressing for one week. Wound Cleansing: Wound #9 Right,Medial Foot: May shower with protection. - use cast protector Primary Wound Dressing: Wound #9 Right,Medial Foot: Polymem Silver Secondary Dressing: Wound #9 Right,Medial Foot: Foam - foam donut Dry Gauze ABD pad Zetuvit or  Kerramax Edema Control: 3 Layer Compression System - Right Lower Extremity Avoid standing for long  periods of time Elevate legs to the level of the heart or above for 30 minutes daily and/or when sitting, a frequency of: - throughout the day Exercise regularly Off-Loading: Open toe surgical shoe to: - right foot 1. We continue with the polymen Ag on the right foot/foam/3 layer compression 2. Graduates into her own shoe on the left foot. We will see how this holds together. This is the area with underlying osteomyelitis that was treated with IV antibiotics. Electronic Signature(s) Signed: 07/23/2019 5:09:59 PM By: Baltazar Najjar MD Entered By: Baltazar Najjar on 07/23/2019 14:59:28 -------------------------------------------------------------------------------- SuperBill Details Patient Name: Date of Service: [facility], Cedric J. 07/23/2019 Medical Record Number: [id number] Patient Account Number: [id number] Date of Birth/Sex: Treating RN: [birth year] (64 y.o. [name] Primary Care Provider: MA SO UDI, Phelps MA LSA DA [name] Other Clinician: Referring Provider: Treating Provider/Extender: Abe People, ELHA MA LSA DA [name] Weeks in Treatment: 25 Diagnosis Coding ICD-10 Codes Code Description E11.621 Type 2 diabetes mellitus with foot ulcer L97.524 Non-pressure chronic ulcer of other part of left foot with necrosis of bone L97.511 Non-pressure chronic ulcer of other part of right foot limited to breakdown of skin L97.514 Non-pressure chronic ulcer of other part of right foot with necrosis of bone E11.42 Type 2 diabetes mellitus with diabetic polyneuropathy M14.671 Charcot's joint, right ankle and foot M14.672 Charcot's joint, left ankle and foot 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, left ankle and foot Facility Procedures CPT4 Code: [id number] Description: 11042 - DEB SUBQ TISSUE 20 SQ CM/< ICD-10 Diagnosis Description L97.511 Non-pressure chronic ulcer of other part of  right foot limited to breakdown of Modifier: skin Quantity: 1 Physician Procedures : CPT4 Code Description Modifier [id number] 11042 - WC PHYS SUBQ TISS 20 SQ CM ICD-10 Diagnosis Description L97.511 Non-pressure chronic ulcer of other part of right foot limited to breakdown of skin Quantity: 1 Electronic Signature(s) Signed: 07/23/2019 5:09:59 PM By: Baltazar Najjar MD Entered By: Baltazar Najjar on 07/23/2019 14:59:43

## 2019-07-24 ENCOUNTER — Other Ambulatory Visit: Payer: Self-pay

## 2019-07-24 ENCOUNTER — Encounter: Payer: Self-pay | Admitting: Internal Medicine

## 2019-07-24 ENCOUNTER — Ambulatory Visit (INDEPENDENT_AMBULATORY_CARE_PROVIDER_SITE_OTHER): Payer: Medicare Other | Admitting: Internal Medicine

## 2019-07-24 DIAGNOSIS — M86372 Chronic multifocal osteomyelitis, left ankle and foot: Secondary | ICD-10-CM | POA: Diagnosis present

## 2019-07-24 NOTE — Assessment & Plan Note (Signed)
I am very hopeful that the osteomyelitis of the left foot has been cured following intensive wound care and 12 weeks of antibiotic therapy.  I recommended that she go ahead and receive Covid vaccination as soon as possible.  She can follow-up with me as needed.

## 2019-07-24 NOTE — Progress Notes (Signed)
[facility]  Patient Active Problem List   Diagnosis Date Noted  . Osteomyelitis of left foot (HCC) 03/02/2019    Priority: High  . Puncture wound of foot 08/08/2018  . Right ankle swelling 02/08/2018  . Obesity 05/18/2017  . Hyperlipidemia 11/12/2014  . Charcot foot due to diabetes mellitus (HCC) 11/06/2014  . CKD (chronic kidney disease) stage 3, GFR 30-59 ml/min 02/02/2013  . GERD (gastroesophageal reflux disease) 03/09/2012  . Routine adult health maintenance 05/26/2010  . Obstructive sleep apnea 05/26/2010  . Normocytic anemia 01/12/2010  . Diabetes mellitus type II, controlled (HCC) 06/17/2009  . Essential hypertension 06/17/2009    Patient's Medications  New Prescriptions   No medications on file  Previous Medications   ACCU-CHEK SOFTCLIX LANCETS LANCETS    Use to check blood glucose up to three times a day. Dx code E 11.9   ACETAMINOPHEN (TYLENOL) 500 MG TABLET    Take 500-1,000 mg by mouth every 6 (six) hours as needed for moderate pain or headache.   ASPIRIN EC 81 MG TABLET    Take 1 tablet (81 mg total) by mouth daily.   ATORVASTATIN (LIPITOR) 40 MG TABLET    Take 1 tablet (40 mg total) by mouth daily.   BIOTIN 1000 MCG TABLET    Take 1,000 mcg by mouth daily.   BRIMONIDINE-TIMOLOL (COMBIGAN) 0.2-0.5 % OPHTHALMIC SOLUTION    Place 1 drop into both eyes every 12 (twelve) hours. Place one drop to both eyes twice daily for glaucoma   CALCIUM-VITAMIN D (OSCAL-500) 500-400 MG-UNIT TABLET    Take 1 tablet by mouth 2 (two) times daily.    CHOLECALCIFEROL (VITAMIN D3) 25 MCG (1000 UT) CAPS    Take 1 capsule by mouth daily.   CYCLOSPORINE (RESTASIS) 0.05 % OPHTHALMIC EMULSION    Place 1 drop into both eyes 2 (two) times daily.   EMOLLIENT (BAG BALM EX)    Apply 1 application topically daily.   FERROUS SULFATE 325 (65 FE) MG TABLET    Take 1 tablet (325 mg total) by mouth daily with breakfast.   GLUCOSE BLOOD (ACCU-CHEK AVIVA) TEST STRIP     Use to check blood glucose up to three times a day. Dx code E 11.9   [name]    TAKE ONE TABLET BY MOUTH TWICE A DAY   LISINOPRIL (ZESTRIL) 10 MG TABLET    TAKE ONE TABLET BY MOUTH DAILY   MULTIPLE VITAMIN (MULTIVITAMIN) TABLET    Take 1 tablet by mouth daily.    NETARSUDIL-LATANOPROST (ROCKLATAN) 0.02-0.005 % SOLN    Apply 1 drop to eye daily at 10 pm.   PANTOPRAZOLE (PROTONIX) 20 MG TABLET    Take 1 tablet (20 mg total) by mouth 2 (two) times daily.   POLYVINYL ALCOHOL-POVIDONE (REFRESH OP)    Place 1 drop into both eyes daily as needed (dry eyes).   VITAMIN B-12 (CYANOCOBALAMIN) 1000 MCG TABLET    Take 1,000 mcg by mouth daily.   VITAMIN C (ASCORBIC ACID) 500 MG TABLET    Take 500 mg by mouth daily.   ZINC 50 MG CAPS    Take 50 mg by mouth daily.   Modified Medications   No medications on file  Discontinued Medications   CEFTRIAXONE (ROCEPHIN) 10 G INJECTION        Subjective: [name] is in for her routine follow-up visit.  She has diabetes and neuropathy complicated by ulcers on  both feet for several months.  She suffers with Charcot arthropathy.  Notes indicate that there has been exposed bone on both feet recently. 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 The MRI of her left foot showed cortical irregularity and edema of the calcaneus concerning for osteomyelitis.  She underwent bone biopsy on 03/04/2019.  No organisms were seen on stain but cultures grew Citrobacter koseri, strep mitis/oralis and strep anginosis. Pathology review showed only benign reactive changes.  Her C-reactive protein was elevated at 21.  I elected to start her on IV ceftriaxone and oral metronidazole.  She completed 8 weeks of therapy on 05/14/2019 before switching to oral cephalexin and oral metronidazole.  She completed 12 weeks of total antibiotic therapy on 06/12/2019.  She feels like she is doing much better.     Review of Systems: Review of Systems   Constitutional: Negative for chills, diaphoresis and fever.  Gastrointestinal: Negative for abdominal pain, diarrhea, nausea and vomiting.  Musculoskeletal: Negative for joint pain.    Past Medical History:  Diagnosis Date  . Chronic normocytic anemia   . CKD (chronic kidney disease) stage 3, GFR 30-59 ml/min 02/02/2013  . Diabetes mellitus type II, controlled (HCC) 06/17/2009  . Essential hypertension 06/17/2009  . GERD (gastroesophageal reflux disease)   . Glaucoma   . H/O hiatal hernia   . Hyperlipidemia   . MRSA (methicillin resistant Staphylococcus aureus)   . Obstructive sleep apnea 05/26/2010    Social History   Tobacco Use  . Smoking status: Former Smoker    Packs/day: 0.50    Years: 0.00    Pack years: 0.00    Types: Cigarettes    Quit date: 12/22/1972    Years since quitting: 46.6  . Smokeless tobacco: Never Used  Vaping Use  . Vaping Use: Never used  Substance Use Topics  . Alcohol use: No  . Drug use: No    Family History  Problem Relation Age of Onset  . Diabetes Mother   . Hypertension Mother   . Dementia Mother   . Heart disease Father   . Stroke Father   . Diabetes Father   . Hypertension Father   . Gout Father   . Deep vein thrombosis Father   . Breast cancer Cousin   . Breast cancer Cousin     Allergies  Allergen Reactions  . Codeine Itching    swelling in throat  . Pravastatin Other (See Comments)    cramps  . Latex Rash    Objective: Vitals:   07/24/19 1557  BP: 117/73  Pulse: 78  Temp: 97.6 F (36.4 C)  TempSrc: Oral  Weight: 252 lb (114.3 kg)   Body mass index is 37.21 kg/m.  Physical Exam Constitutional:      Comments: She is in good spirits.  Musculoskeletal:     Comments: The plantar ulcer on her left foot is now dime sized, very superficial and without drainage or malodor.  Her right foot is wrapped.        Sed Rate (mm/hr)  Date Value  06/06/2019 76 (H)  01/30/2013 54 (H)  06/14/2009 60 (H)   CRP (mg/L)    Date Value  06/06/2019 13 (H)  02/28/2019 21 (H)  08/30/2018 51 (H)         Problem List Items Addressed This Visit      High   Osteomyelitis of left foot (HCC)    I am very hopeful that the osteomyelitis  of the left foot has been cured following intensive wound care and 12 weeks of antibiotic therapy.  I recommended that she go ahead and receive Covid vaccination as soon as possible.  She can follow-up with me as needed.          Sarah Asters, MD [facility] [phone] pager   [phone] cell 07/24/2019, 4:29 PM

## 2019-07-25 ENCOUNTER — Telehealth: Payer: Self-pay | Admitting: Internal Medicine

## 2019-07-29 ENCOUNTER — Encounter (HOSPITAL_BASED_OUTPATIENT_CLINIC_OR_DEPARTMENT_OTHER): Payer: Medicare Other | Attending: Internal Medicine | Admitting: Internal Medicine

## 2019-07-29 DIAGNOSIS — L97511 Non-pressure chronic ulcer of other part of right foot limited to breakdown of skin: Secondary | ICD-10-CM | POA: Diagnosis not present

## 2019-07-29 DIAGNOSIS — N183 Chronic kidney disease, stage 3 unspecified: Secondary | ICD-10-CM | POA: Diagnosis not present

## 2019-07-29 DIAGNOSIS — K219 Gastro-esophageal reflux disease without esophagitis: Secondary | ICD-10-CM | POA: Diagnosis not present

## 2019-07-29 DIAGNOSIS — I129 Hypertensive chronic kidney disease with stage 1 through stage 4 chronic kidney disease, or unspecified chronic kidney disease: Secondary | ICD-10-CM | POA: Diagnosis not present

## 2019-07-29 DIAGNOSIS — E11610 Type 2 diabetes mellitus with diabetic neuropathic arthropathy: Secondary | ICD-10-CM | POA: Diagnosis not present

## 2019-07-29 DIAGNOSIS — G4733 Obstructive sleep apnea (adult) (pediatric): Secondary | ICD-10-CM | POA: Insufficient documentation

## 2019-07-29 DIAGNOSIS — M14671 Charcot's joint, right ankle and foot: Secondary | ICD-10-CM | POA: Insufficient documentation

## 2019-07-29 DIAGNOSIS — E1142 Type 2 diabetes mellitus with diabetic polyneuropathy: Secondary | ICD-10-CM | POA: Diagnosis not present

## 2019-07-29 DIAGNOSIS — Z8614 Personal history of Methicillin resistant Staphylococcus aureus infection: Secondary | ICD-10-CM | POA: Insufficient documentation

## 2019-07-29 DIAGNOSIS — M14672 Charcot's joint, left ankle and foot: Secondary | ICD-10-CM | POA: Diagnosis not present

## 2019-07-29 DIAGNOSIS — E1122 Type 2 diabetes mellitus with diabetic chronic kidney disease: Secondary | ICD-10-CM | POA: Diagnosis not present

## 2019-07-29 DIAGNOSIS — E11621 Type 2 diabetes mellitus with foot ulcer: Secondary | ICD-10-CM | POA: Insufficient documentation

## 2019-07-30 NOTE — Progress Notes (Addendum)
Sarah, [name] ([id number]) Visit Report for 07/29/2019 Arrival Information Details Patient Name: Date of Service: Sarah, [name] 07/29/2019 1:30 PM Medical Record Number: [id number] Patient Account Number: [id number] Date of Birth/Sex: Treating RN: 23-May-1955 (64 y.o. F) [name] Primary Care [name]: MA SO UDI, [place] MA LSA DA T Other Clinician: Referring [name]: Treating [name]/Extender: Sarah Najjar MA SO UDI, ELHA MA LSA DA T Weeks in Treatment: 26 Visit Information History Since Last Visit Added or deleted any medications: No Patient Arrived: Crutches Any new allergies or adverse reactions: No Arrival Time: 13:55 Had a fall or experienced change in No Accompanied By: self activities of daily living that may affect Transfer Assistance: None risk of falls: Patient Identification Verified: Yes Signs or symptoms of abuse/neglect since last visito No Secondary Verification Process Completed: Yes Hospitalized since last visit: No Patient Requires Transmission-Based Precautions: No Implantable device outside of the clinic excluding No Patient Has Alerts: No cellular tissue based products placed in the center since last visit: Has Dressing in Place as Prescribed: Yes Has Compression in Place as Prescribed: Yes Pain Present Now: No Electronic Signature(s) Signed: 07/30/2019 7:38:35 AM By: [name] Entered By: [name] on 07/29/2019 14:00:30 -------------------------------------------------------------------------------- Compression Therapy Details Patient Name: Date of Service: [facility] [name], [name] 07/29/2019 1:30 PM Medical Record Number: [id number] Patient Account Number: [id number] Date of Birth/Sex: Treating RN: [birth year] (64 y.o. [name] Primary Care [name]: MA SO UDI, Sarah Companion MA LSA DA T Other Clinician: Referring [name]: Treating [name] Hickson/Extender: Sarah Najjar MA SO UDI, ELHA MA LSA DA T Weeks in Treatment:  26 Compression Therapy Performed for Wound Assessment: Wound #9 Right,Medial Foot Performed By: Clinician Sarah Abts, RN Compression Type: Three Layer Post Procedure Diagnosis Same as Pre-procedure Electronic Signature(s) Signed: 07/29/2019 5:04:17 PM By: Sarah Abts RN, BSN Entered By: [name] on 07/29/2019 14:30:16 -------------------------------------------------------------------------------- Encounter Discharge Information Details Patient Name: Date of Service: [name], Nozomi J. 07/29/2019 1:30 PM Medical Record Number: [id number] Patient Account Number: [id number] Date of Birth/Sex: Treating RN: 1955/02/02 (64 y.o. Tommye [name] Primary Care Shaynah Hund: MA SO UDI, [place] MA LSA DA T Other Clinician: Referring [name]: Treating Nikisha Fleece/Extender: Sarah Najjar MA SO UDI, ELHA MA LSA DA T Weeks in Treatment: 48 Encounter Discharge Information Items Post Procedure Vitals Discharge Condition: Stable Temperature (F): 98 Ambulatory Status: Walker Pulse (bpm): 71 Discharge Destination: Home Respiratory Rate (breaths/min): 18 Transportation: Other Blood Pressure (mmHg): 130/74 Accompanied By: self Schedule Follow-up Appointment: Yes Clinical Summary of Care: Patient Declined Electronic Signature(s) Signed: 07/30/2019 5:34:53 PM By: Sarah Deed RN, BSN Entered By: [name] on 07/29/2019 14:48:10 -------------------------------------------------------------------------------- Lower Extremity Assessment Details Patient Name: Date of Service: Sarah [name], [name] 07/29/2019 1:30 PM Medical Record Number: [id number] Patient Account Number: [id number] Date of Birth/Sex: Treating RN: 1955/09/07 (64 y.o. F) [name] Primary Care [name]: MA SO UDI, [place] MA LSA DA T Other Clinician: Referring [name]: Treating [name]/Extender: Sarah Najjar MA SO UDI, ELHA MA LSA DA T Weeks in Treatment: 26 Edema Assessment Assessed: [Left: No]  [Right: No] Edema: [Left: Ye] [Right: s] Calf Left: Right: Point of Measurement: 43 cm From Medial Instep cm 38 cm Ankle Left: Right: Point of Measurement: 12 cm From Medial Instep cm 22.5 cm Vascular Assessment Pulses: Dorsalis Pedis Palpable: [Right:Yes] Electronic Signature(s) Signed: 07/30/2019 7:38:35 AM By: [name] Entered By: [name] on 07/29/2019 14:04:47 -------------------------------------------------------------------------------- Multi Wound Chart Details Patient Name: Date of Service: [name], [name] 07/29/2019 1:30 PM Medical Record Number: [id number]  Patient Account Number: [id number] Date of Birth/Sex: Treating RN: 1955/10/12 (64 y.o. [name] Primary Care [name]: MA SO UDI, Sarah Companion MA LSA DA T Other Clinician: Referring Modupe Shampine: Treating [name]/Extender: Sarah Najjar MA SO UDI, ELHA MA LSA DA T Weeks in Treatment: 26 Vital Signs Height(in): 68 Pulse(bpm): 71 Weight(lbs): 265 Blood Pressure(mmHg): 130/74 Body Mass Index(BMI): 40 Temperature(F): 98 Respiratory Rate(breaths/min): 18 Photos: [9:No Photos Right, Medial Foot] [N/A:N/A N/A] Wound Location: [9:Blister] [N/A:N/A] Wounding Event: [9:Diabetic Wound/Ulcer of the Lower] [N/A:N/A] Primary Etiology: [9:Extremity Glaucoma, Chronic sinus] [N/A:N/A] Comorbid History: [9:problems/congestion, Anemia, Sleep Apnea, Hypertension, Type II Diabetes, Rheumatoid Arthritis, Neuropathy 06/18/2018] [N/A:N/A] Date Acquired: [9:26] [N/A:N/A] Weeks of Treatment: [9:Open] [N/A:N/A] Wound Status: [9:2.2x2.2x0.3] [N/A:N/A] Measurements L x W x D (cm) [9:3.801] [N/A:N/A] A (cm) : rea [9:1.14] [N/A:N/A] Volume (cm) : [9:65.40%] [N/A:N/A] % Reduction in A [9:rea: 88.50%] [N/A:N/A] % Reduction in Volume: [9:3] Starting Position 1 (o'clock): [9:6] Ending Position 1 (o'clock): [9:0.5] Maximum Distance 1 (cm): [9:Yes] [N/A:N/A] Undermining: [9:Grade 2] [N/A:N/A] Classification:  [9:Medium] [N/A:N/A] Exudate A mount: [9:Serosanguineous] [N/A:N/A] Exudate Type: [9:red, brown] [N/A:N/A] Exudate Color: [9:Well defined, not attached] [N/A:N/A] Wound Margin: [9:Large (67-100%)] [N/A:N/A] Granulation A mount: [9:Red, Pink] [N/A:N/A] Granulation Quality: [9:None Present (0%)] [N/A:N/A] Necrotic A mount: [9:Fat Layer (Subcutaneous Tissue)] [N/A:N/A] Exposed Structures: [9:Exposed: Yes Fascia: No Tendon: No Muscle: No Joint: No Bone: No Small (1-33%)] [N/A:N/A] Epithelialization: [9:Debridement - Excisional] [N/A:N/A] Debridement: Pre-procedure Verification/Time Out 14:26 [N/A:N/A] Taken: [9:Callus, Subcutaneous] [N/A:N/A] Tissue Debrided: [9:Skin/Subcutaneous Tissue] [N/A:N/A] Level: [9:4.84] [N/A:N/A] Debridement A (sq cm): [9:rea Curette] [N/A:N/A] Instrument: [9:Moderate] [N/A:N/A] Bleeding: [9:Pressure] [N/A:N/A] Hemostasis A chieved: [9:0] [N/A:N/A] Procedural Pain: [9:0] [N/A:N/A] Post Procedural Pain: [9:Procedure was tolerated well] [N/A:N/A] Debridement Treatment Response: [9:2.2x2.2x0.3] [N/A:N/A] Post Debridement Measurements L x W x D (cm) [9:1.14] [N/A:N/A] Post Debridement Volume: (cm) [9:Compression Therapy] [N/A:N/A] Procedures Performed: [9:Debridement] Treatment Notes Electronic Signature(s) Signed: 07/29/2019 5:04:17 PM By: Sarah Abts RN, BSN Signed: 07/30/2019 8:12:39 AM By: Sarah Najjar MD Entered By: [name] on 07/29/2019 14:42:08 -------------------------------------------------------------------------------- Multi-Disciplinary Care Plan Details Patient Name: Date of Service: [facility], [name] 07/29/2019 1:30 PM Medical Record Number: [id number] Patient Account Number: [id number] Date of Birth/Sex: Treating RN: Jul 11, 1955 (64 y.o. [name] Primary Care Elmyra Banwart: MA SO UDI, Sarah Companion MA LSA DA T Other Clinician: Referring Jasiah Buntin: Treating Michaella Imai/Extender: Sarah Najjar MA SO UDI, ELHA MA LSA DA T Weeks in  Treatment: 26 Active Inactive Wound/Skin Impairment Nursing Diagnoses: Impaired tissue integrity Knowledge deficit related to ulceration/compromised skin integrity Goals: Patient/caregiver will verbalize understanding of skin care regimen Date Initiated: 01/28/2019 Target Resolution Date: 08/09/2019 Goal Status: Active Interventions: Assess patient/caregiver ability to obtain necessary supplies Assess patient/caregiver ability to perform ulcer/skin care regimen upon admission and as needed Assess ulceration(s) every visit Provide education on ulcer and skin care Notes: Electronic Signature(s) Signed: 07/29/2019 5:04:17 PM By: Sarah Abts RN, BSN Entered By: [name] on 07/29/2019 14:25:03 -------------------------------------------------------------------------------- Pain Assessment Details Patient Name: Date of Service: [name], [name] 07/29/2019 1:30 PM Medical Record Number: [id number] Patient Account Number: [id number] Date of Birth/Sex: Treating RN: March 27, 1955 (64 y.o. [name] Primary Care Aailyah Dunbar: MA SO UDI, [place] MA LSA DA T Other Clinician: Referring Edison Nicholson: Treating Sayre Witherington/Extender: Sarah Najjar MA SO UDI, ELHA MA LSA DA T Weeks in Treatment: 26 Active Problems Location of Pain Severity and Description of Pain Patient Has Paino No Site Locations Pain Management and Medication Current Pain Management: Electronic Signature(s) Signed: 07/30/2019 7:38:35 AM By: [name] Entered By: [name] on  07/29/2019 14:00:59 -------------------------------------------------------------------------------- Patient/Caregiver Education Details Patient Name: Date of Service: Sarah, [name] 7/12/2021andnbsp1:30 PM Medical Record Number: [id number] Patient Account Number: [id number] Date of Birth/Gender: Treating RN: 22-Oct-1955 (64 y.o. [name] Primary Care Physician: MA SO UDI, Sarah Companion MA LSA DA T Other  Clinician: Referring Physician: Treating Physician/Extender: Sarah Najjar MA SO UDI, ELHA MA LSA DA T Weeks in Treatment: 9 Education Assessment Education Provided To: Patient Education Topics Provided Wound/Skin Impairment: Methods: Explain/Verbal Responses: State content correctly [employer]) Signed: 07/29/2019 5:04:17 PM By: Sarah Abts RN, BSN Entered By: [name] on 07/29/2019 14:25:23 -------------------------------------------------------------------------------- Wound Assessment Details Patient Name: Date of Service: [name], [name] 07/29/2019 1:30 PM Medical Record Number: [id number] Patient Account Number: [id number] Date of Birth/Sex: Treating RN: Jul 07, 1955 (63 y.o. F) [name] Primary Care Kassim Guertin: MA SO UDI, [place] MA LSA DA T Other Clinician: Referring Arland Usery: Treating Travius Crochet/Extender: Sarah Najjar MA SO UDI, ELHA MA LSA DA T Weeks in Treatment: 26 Wound Status Wound Number: 9 Primary Diabetic Wound/Ulcer of the Lower Extremity Etiology: Wound Location: Right, Medial Foot Wound Open Wounding Event: Blister Status: Date Acquired: 06/18/2018 Comorbid Glaucoma, Chronic sinus problems/congestion, Anemia, Sleep Weeks Of Treatment: 26 History: Apnea, Hypertension, Type II Diabetes, Rheumatoid Arthritis, Clustered Wound: No Neuropathy Photos Photo Uploaded By: Benjaman Kindler on 07/30/2019 14:49:49 Wound Measurements Length: (cm) 2.2 Width: (cm) 2.2 Depth: (cm) 0.3 Area: (cm) 3.80 Volume: (cm) 1.14 % Reduction in Area: 65.4% % Reduction in Volume: 88.5% Epithelialization: Small (1-33%) 1 Tunneling: No Undermining: Yes Starting Position (o'clock): 3 Ending Position (o'clock): 6 Maximum Distance: (cm) 0.5 Wound Description Classification: Grade 2 Wound Margin: Well defined, not attached Exudate Amount: Medium Exudate Type: Serosanguineous Exudate Color: red, brown Foul Odor After Cleansing:  No Slough/Fibrino No Wound Bed Granulation Amount: Large (67-100%) Exposed Structure Granulation Quality: Red, Pink Fascia Exposed: No Necrotic Amount: None Present (0%) Fat Layer (Subcutaneous Tissue) Exposed: Yes Tendon Exposed: No Muscle Exposed: No Joint Exposed: No Bone Exposed: No Treatment Notes Wound #9 (Right, Medial Foot) 2. Periwound Care Moisturizing lotion 3. Primary Dressing Applied Polymem Ag 4. Secondary Dressing ABD Pad Foam Other secondary dressing (specify in notes) 6. Support Layer Applied 3 layer compression wrap Notes carboflex. stockinette Electronic Signature(s) Signed: 07/30/2019 7:38:35 AM By: [name] Entered By: [name] on 07/29/2019 14:08:53 -------------------------------------------------------------------------------- Vitals Details Patient Name: Date of Service: [name], [name] 07/29/2019 1:30 PM Medical Record Number: [id number] Patient Account Number: [id number] Date of Birth/Sex: Treating RN: 11-29-1955 (64 y.o. F) [name] Primary Care Heriberto Stmartin: MA SO UDI, [place] MA LSA DA T Other Clinician: Referring Josejulian Tarango: Treating Illya Gienger/Extender: Sarah Najjar MA SO UDI, ELHA MA LSA DA T Weeks in Treatment: 26 Vital Signs Time Taken: 14:00 Temperature (F): 98 Height (in): 68 Pulse (bpm): 71 Weight (lbs): 265 Respiratory Rate (breaths/min): 18 Body Mass Index (BMI): 40.3 Blood Pressure (mmHg): 130/74 Reference Range: 80 - 120 mg / dl Electronic Signature(s) Signed: 07/30/2019 7:38:35 AM By: [name] Entered By: [name] on 07/29/2019 14:00:53

## 2019-07-30 NOTE — Progress Notes (Addendum)
[name], [name] ([id number]) Visit Report for 07/29/2019 Debridement Details Patient Name: Date of Service: ANJANETTE, GILKEY 07/29/2019 1:30 PM Medical Record Number: [id number] Patient Account Number: [id number] Date of Birth/Sex: Treating RN: 1955-05-15 (64 y.o. [name] Primary Care Provider: MA SO UDI, [place] MA LSA DA T Other Clinician: Referring Provider: Treating Provider/Extender: Abe People, ELHA MA LSA DA T Weeks in Treatment: 26 Debridement Performed for Assessment: Wound #9 Right,Medial Foot Performed By: Physician [name], MD Debridement Type: Debridement Severity of Tissue Pre Debridement: Fat layer exposed Level of Consciousness (Pre-procedure): Awake and Alert Pre-procedure Verification/Time Out Yes - 14:26 Taken: Start Time: 14:26 T Area Debrided (L x W): otal 2.2 (cm) x 2.2 (cm) = 4.84 (cm) Tissue and other material debrided: Viable, Non-Viable, Callus, Subcutaneous Level: Skin/Subcutaneous Tissue Debridement Description: Excisional Instrument: Curette Bleeding: Moderate Hemostasis Achieved: Pressure End Time: 14:27 Procedural Pain: 0 Post Procedural Pain: 0 Response to Treatment: Procedure was tolerated well Level of Consciousness (Post- Awake and Alert procedure): Post Debridement Measurements of Total Wound Length: (cm) 2.2 Width: (cm) 2.2 Depth: (cm) 0.3 Volume: (cm) 1.14 Character of Wound/Ulcer Post Debridement: Improved Severity of Tissue Post Debridement: Fat layer exposed Post Procedure Diagnosis Same as Pre-procedure Electronic Signature(s) Signed: 07/29/2019 5:04:17 PM By: Zandra Abts RN, BSN Signed: 07/30/2019 8:12:39 AM By: Baltazar Najjar MD Entered By: Baltazar Najjar on 07/29/2019 14:42:22 -------------------------------------------------------------------------------- HPI Details Patient Name: Date of Service: [name], [name] 07/29/2019 1:30 PM Medical Record Number: [id number] Patient  Account Number: [id number] Date of Birth/Sex: Treating RN: [birth year] (64 y.o. [name] Primary Care Provider: MA SO UDI, Parker MA LSA DA T Other Clinician: Referring Provider: Treating Provider/Extender: Baltazar Najjar MA SO UDI, ELHA MA LSA DA T Weeks in Treatment: 26 History of Present Illness HPI Description: 64 yrs old bf here for follow up recurrent left charcot foot ulcer. She has DM. She is not a smoker. She states a blister developed 3 months ago at site of previous breakdown from 1 year ago. It was debrided at the podiatrist's office. She went to the ER and then sent here. She denies pain. Xray was negative for osteo. Culture from this clinic negative. 09/08/14 [address] [name] to [name] for Charcot foot. Seen by him and MRI scheduled 09/19/14. Prior silver collagen but this was not ordered last visit, patient has been rinsing with saline alone. Re institute silver collagen every other day. F/u 2 weeks with [name] as Labor Day holiday. Supplies ordered. 09/25/14; this is a patient with a Charcot foot on the left. she has a wound over the plantar aspect of her left calcaneus. She has been seen by [name] of orthopedic surgery and has had an MRI. She is apparently going within the next week or 2 for corrective surgery which will involve an external fixator. I don't have any of the details of this. 10/06/14; The patient is a 64 yrs old bf with a left Charcot foot and ulcer on the plantar. 12/01/14 Returns post surgery with [name]. Has follow up visit later this week with him, currently in facility and NWB over this extremity. States no drainage from foot and no current wound care. On exam callus and scab in place, suspect healed. Has external fixator in place. Will defer to [name] for management, ok to place moisturizing cream over scabs and callus and she will call if she requires follow up here. READMISSION 01/28/2019 Patient was in the  clinic in 2016 for a  wound on the plantar aspect of her left calcaneus. Predominantly cared for by [name] she was referred to [name] and had corrective surgery for the position of her left ankle I believe. She had previously been here in 2015 and then prior to that in 2011 2012 that I do not have these records. More recently she has developed fairly extensive wounds on the right medial foot, left lateral foot and a small area on the right medial great toe. Right medial foot wound has been there for 6 to 7 months, left foot wound for 2 to 3 months and the right great toe only over the last few weeks. She has been followed by [name] at Dr. Laverta [place] office it sounds as though she has been undergoing callus paring and silver nitrate. The patient has been washing these off with soap and water and plan applying dry dressing Past medical history, type 2 diabetes well controlled with a recent hemoglobin A1c of 7 on 11/16, type 2 diabetes with peripheral neuropathy, previous left Charcot joint surgery bilateral Charcot joints currently, stage III chronic renal failure, hypertension, obstructive sleep apnea, history of MRSA and gastroesophageal reflux. 1/18; this is a patient with bilateral Charcot feet. Plain x-rays that I did of both of these wounds that are either at bone or precariously close to bone were done. On the right foot this showed possible lytic destruction involving the anterior aspect of the talus which may represent a wound osteomyelitis. An MRI was recommended. On the left there was no definite lytic destruction although the wound is deep undermining's and I think probably requires an MRI on this basis in any case 1/25; patient was supposed to have her MRI last Friday of her bilateral feet however they would not do it because there was silver alginate in her dressings, will replace with calcium alginate today and will see if we can get her done today 2/1; patient did not get her MRIs done last  week because of issues with the MRI department receiving orders to do bilateral feet. She has 2 deep wounds in the setting of Charcot feet deformities bilaterally. Both of them at one point had exposed bone although I had trouble demonstrating that today. I am not sure that we can offload these very aggressively as I watched her leave the clinic last week her gait is very narrow and unsteady. 2/15; the MRI of her right foot did not show osteomyelitis potentially osteonecrosis of the talus. 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 The wounds are a large wound on the right medial foot and on the left a substantial wound on the left lateral plantar calcaneus. We have been using silver alginate. Ultimately I think we need to consider a total contact cast on the right while we work through the possibility of osteomyelitis in the left. I watched her walk out with her crutches I have some trepidation about the ability to walk with the cast on her right foot. Nevertheless with her Charcot deformities I do not think there is a way to heal these short of offloading them aggressively. 2/22; the culture of the bone in the left foot that I did last week showed a few Citrobacter Koseri as well as some streptococcal species. In my attempted bone for pathology did not actually yield a lot of bone the pathologist did not identify any osteomyelitis. Nevertheless based on the MRI, bone for culture  and the probing wound into the bone itself I think this woman has osteomyelitis in the left foot and we will refer her to infectious disease. Is promised today and aggressive debridement of the right foot and a total contact cast which surprisingly she seemed to tolerate quite well 03/13/2019 upon evaluation today patient appears to be doing well with her total contact cast she is here for the first cast change. She had no areas of rubbing or any other complications at this  point. Overall things are doing quite well. 2/26; the patient was kindly seen by Dr. Megan Salon of infectious disease on 2/25. She is now on IV vancomycin PICC line placement in 2 days and oral Flagyl. This was in response to her MRI showing osteomyelitis of the left calcaneus concerning for osteomyelitis. We have been putting her right leg in a total contact cast. Our nurses report drainage. She is tolerating the total contact cast well. 3/4; the patient is started her IV antibiotics and is taking IV vancomycin and oral Flagyl. She appears to be tolerating these both well. This is for osteomyelitis involving the left calcaneus. The area on the right in the setting of bilateral Charcot deformities did not have any bone infection on MRI. We put a total contact cast on her with silver alginate as the primary dressing and she returns today in follow-up. Per our intake nurse there was too much drainage to leave this on all week therefore we will bring her back for an early change 3/8; follow-up total contact cast she is still having a lot of drainage probably too much drainage from the right foot wound will week with the same cast. She will be back on Thursday. The area on the left looks somewhat better 3/11; patient here for a total contact cast change. 3/15; patient came in for wound care evaluation. She is still on IV vancomycin and oral Flagyl for osteomyelitis in the left foot. The area on the right foot we are putting in a total contact cast. 3/18 for total contact cast change on the right foot 3/22; the patient was here for wound review today. The area on the right foot is slightly smaller in terms of surface area. However the surface of the wound is very gritty and hyper granulated. More problematically the area on the plantar left foot has increased depth indeed in the center of this it probes to bone. We have been using Hydrofera Blue in both areas. She is on antibiotics as prescribed by  infectious disease IV vancomycin and oral Flagyl 3/25; the patient is in for total contact cast change. Our intake nurse was concerned about the amount of drainage which soaked right to the multiple layers we are putting on this. Wondered about options. The wound bed actually looks as healthy as this is looked although there may not be a lot of change in dimensions things are looking a lot better. If we are going to heal this woman's wound which is in the middle of her right Charcot foot this is going to need to be offloaded. There are not many alternatives 3/29; still not a lot of improvement although the surface of the right wound looks better. We have been using Hydrofera Blue 04/17/2019 upon evaluation today patient appears to be doing well with a total contact cast. With actually having to change this twice a week. She saw Dr. Dellia Nims for wound care earlier in the week this is for the cast change today. That is due  to the fact that she has significant drainage. Overall the wound is been looking excellent however. 4/5; we continue to put a total contact cast on this patient with Hydrofera Blue. Still a lot of drainage which precludes a simple weekly change or changing this twice a week. 4/8; total contact cast changed today. Apparently the wound looks better per our intake nurse 4/12; patient here for wound care evaluation. Wounds on the right foot have not changed in dimension none about a month left foot looks similar. Apparently her antibiotics that we are giving her for osteomyelitis in the left foot complete on Wednesday. We have been using Hydrofera Blue on the right foot under a total contact cast. Nurses report greenish drainage although I think this may be discoloration from the [facility] 4/15; back for cast change wounds look quite good although still moderate amount of drainage. T contact cast reapplied otal 4/19;. Wound evaluation. Wound on the right foot is smaller surface looks  healthy. There is still the divot in the middle part of this wound but I could not feel any bone. Area on the left foot also looks better She has completed her antibiotics but still has the PICC line in. 4/23; patient comes back for a total contact cast change this was done in the standard fashion no issues 4/26; wound measures slightly larger on the right foot which is disappointing. She sees Dr. Orvan Falconer with regards to her antibiotics on Wednesday. I believe she is on vancomycin IV and oral Flagyl. 4/29; in for her routine total contact cast change. She saw Dr. Orvan Falconer this morning. He is asking about antibiotic continuation I will be in touch with him. I did not look at her wound today 5/3; patient saw Dr. Orvan Falconer on 4/28. I did send him a note asking about the continuation of perhaps an oral regimen. I have not heard back. She still has an elevated sedimentation rate at 81. Her wounds are smaller. 5/6; in for her total contact cast change. We did not look at the wound today. She has seen Dr. Orvan Falconer and is now on oral Keflex and Flagyl. She seems to be tolerating these well 5/10; she had her PICC line removed. The wound on the right foot after some initial improvement has not made any improvement even with the total contact cast. Still having a lot of drainage. Likewise the area on the left foot really is not any better either 5/14; in for a cast change today. We will look at the right foot on Monday. If this is not making any progress I am going to try to cast the left foot. 5/17; the wound on the right foot is absolutely no different. I am going to put ointment on this area and change the cast of the left foot. We have been using silver alginate there 5/21; apparently the area on the right foot was better per our intake nurse although I did not see it. We did a standard total contact cast change on the left foot we have been applying silver alginate to the wound here. 06/10/19-Area on the  right foot per dimensions slightly better, the left foot has still some depth applying silver alginate to the wound with TCC 06/13/19-Patient here for Casting to Left foot 6/1; patient had the total contact cast were placed on the left foot. Noted some swelling in her right leg although the dimensions of the wound on the right foot are better. We have been using PolyMem on the  right and silver alginate on the left under the cast 6/7; we continue to make nice progress in both her plantar feet wound. We are putting the total contact cast on the left. Surprisingly in spite of this the area on the right medial foot in the middle of her Charcot deformities actually is making progress as well 07/01/2019 upon evaluation today patient actually appears to be doing quite well with regard to her wounds on the feet compared to when I saw her last that has been quite some time. With that being said I do believe the total contact cast has been beneficial in this left foot and she has a lot of signs of improvement she does have some callus overhanging which is caused a little bit of a ledge and an issue here. I do believe that we may be able to do something to help in that regard. Fortunately otherwise the wound seems to be progressing quite nicely. 6/21; the patient's area on the left foot that we are currently casting is just about closed. The area on the right medial foot has a healthy looking surface but may be not changed as much from last week in terms of surface area. We are using silver alginate on the right and polymen Ag under the cast 6/28; only a small open area on the left foot remains. Using silver alginate under a total contact cast. She has a diabetic shoe I have asked her to bring that for next week where this may be closed. The area on the right foot with a Charcot deformity is also measuring slightly smaller. We are using PolyMem Ag here 7/6; left foot is totally closed. She does not require a total  contact cast. She will graduate to her own diabetic shoe. The area on the right foot with a Charcot deformity measuring about the same. We have been using polymen under compression wraps. She did not do well in this area with a total contact cast 7/13; left foot remains intact. She is skillfully covering this area with foam and using her diabetic shoe. Slight area improvement of the right foot wound. Still rolled senescent looking edges. We have been using polymen Electronic Signature(s) Signed: 07/30/2019 8:12:39 AM By: Baltazar Najjar MD Entered By: Baltazar Najjar on 07/29/2019 14:43:08 -------------------------------------------------------------------------------- Physical Exam Details Patient Name: Date of Service: [name], [name] 07/29/2019 1:30 PM Medical Record Number: [id number] Patient Account Number: [id number] Date of Birth/Sex: Treating RN: [birth year] (64 y.o. [name] Primary Care Provider: MA SO UDI, [place] MA LSA DA T Other Clinician: Referring Provider: Treating Provider/Extender: Baltazar Najjar MA SO UDI, ELHA MA LSA DA T Weeks in Treatment: 26 Notes Wound exam Left foot totally closed and looks healthy. She is putting foam across this with tape wearing her diabetic shoes with inserts Right foot dimensions of the wound slightly improved. She has rolled senescent edges. Using a #5 curette I pared down the skin and subcutaneous tissue from around the wound edges surface debris is still present more tactile than visual. Hemostasis with direct pressure Electronic Signature(s) Signed: 07/30/2019 8:12:39 AM By: Baltazar Najjar MD Entered By: Baltazar Najjar on 07/29/2019 14:44:33 -------------------------------------------------------------------------------- Physician Orders Details Patient Name: Date of Service: [name], [name] 07/29/2019 1:30 PM Medical Record Number: [id number] Patient Account Number: [id number] Date of Birth/Sex: Treating RN: 10-14-1955  (64 y.o. [name] Primary Care Provider: MA Hedy Jacob MA LSA DA T Other Clinician: Referring Provider: Treating Provider/Extender: Baltazar Najjar MA SO UDI,  ELHA MA LSA DA T Weeks in Treatment: 26 Verbal / Phone Orders: No Diagnosis Coding ICD-10 Coding Code Description E11.621 Type 2 diabetes mellitus with foot ulcer L97.524 Non-pressure chronic ulcer of other part of left foot with necrosis of bone L97.511 Non-pressure chronic ulcer of other part of right foot limited to breakdown of skin L97.514 Non-pressure chronic ulcer of other part of right foot with necrosis of bone E11.42 Type 2 diabetes mellitus with diabetic polyneuropathy M14.671 Charcot's joint, right ankle and foot M14.672 Charcot's joint, left ankle and foot 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 Dressing Change Frequency Wound #9 Right,Medial Foot Do not change entire dressing for one week. Wound Cleansing Wound #9 Right,Medial Foot May shower with protection. - use cast protector Primary Wound Dressing Wound #9 Right,Medial Foot Polymem Silver Secondary Dressing Wound #9 Right,Medial Foot Foam - foam donut Dry Gauze ABD pad Zetuvit or Kerramax Edema Control 3 Layer Compression System - Right Lower Extremity Avoid standing for long periods of time Elevate legs to the level of the heart or above for 30 minutes daily and/or when sitting, a frequency of: - throughout the day Exercise regularly Off-Loading Open toe surgical shoe to: - right foot Electronic Signature(s) Signed: 07/29/2019 5:04:17 PM By: Zandra Abts RN, BSN Signed: 07/30/2019 8:12:39 AM By: Baltazar Najjar MD Entered By: Zandra Abts on 07/29/2019 14:24:30 -------------------------------------------------------------------------------- Problem List Details Patient Name: Date of Service: [name], [name] 07/29/2019 1:30 PM Medical Record Number:  [id number] Patient Account Number: [id number] Date of Birth/Sex: Treating RN: [birth year] (64 y.o. [name] Primary Care Provider: MA SO UDI, Judene Companion MA LSA DA T Other Clinician: Referring Provider: Treating Provider/Extender: Abe People, ELHA MA LSA DA T Weeks in Treatment: 26 Active Problems ICD-10 Encounter Code Description Active Date MDM Diagnosis E11.621 Type 2 diabetes mellitus with foot ulcer 01/28/2019 No Yes L97.524 Non-pressure chronic ulcer of other part of left foot with necrosis of bone 01/28/2019 No Yes L97.511 Non-pressure chronic ulcer of other part of right foot limited to breakdown of 01/28/2019 No Yes skin L97.514 Non-pressure chronic ulcer of other part of right foot with necrosis of bone 01/28/2019 No Yes E11.42 Type 2 diabetes mellitus with diabetic polyneuropathy 01/28/2019 No Yes M14.671 Charcot's joint, right ankle and foot 01/28/2019 No Yes M14.672 Charcot's joint, left ankle and foot 01/28/2019 No Yes 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 07/29/2019 1:30 PM Medical Record Number: [id number] Patient Account Number: [id number] Date of Birth/Sex: Treating RN: Oct 25, 1955 (64 y.o. [name] Primary Care Provider: Other Clinician: MA SO UDI, Carson Myrtle LSA DA T Referring Provider: Treating Provider/Extender: Baltazar Najjar MA SO UDI, ELHA MA LSA DA T Weeks in Treatment: 26 Subjective History of Present Illness (HPI) 64 yrs old bf here for follow up recurrent left charcot foot ulcer. She has DM. She is not a smoker. She states a blister developed 3 months ago at site of previous breakdown  from 1 year ago. It was debrided at the podiatrist's office. She went to the ER and then sent here. She denies pain. Xray was negative for osteo. Culture from this clinic negative. 09/08/14 Referred by [name] to [name] for Charcot foot. Seen by him and MRI  scheduled 09/19/14. Prior silver collagen but this was not ordered last visit, patient has been rinsing with saline alone. Re institute silver collagen every other day. F/u 2 weeks with [name] as Labor Day holiday. Supplies ordered. 09/25/14; this is a patient with a Charcot foot on the left. she has a wound over the plantar aspect of her left calcaneus. She has been seen by [name] of orthopedic surgery and has had an MRI. She is apparently going within the next week or 2 for corrective surgery which will involve an external fixator. I don't have any of the details of this. 10/06/14; The patient is a 64 yrs old bf with a left Charcot foot and ulcer on the plantar. 12/01/14 Returns post surgery with [name]. Has follow up visit later this week with him, currently in facility and NWB over this extremity. States no drainage from foot and no current wound care. On exam callus and scab in place, suspect healed. Has external fixator in place. Will defer to [name] for management, ok to place moisturizing cream over scabs and callus and she will call if she requires follow up here. READMISSION 01/28/2019 Patient was in the clinic in 2016 for a wound on the plantar aspect of her left calcaneus. Predominantly cared for by [name] she was referred to [name] and had corrective surgery for the position of her left ankle I believe. She had previously been here in 2015 and then prior to that in 2011 2012 that I do not have these records. More recently she has developed fairly extensive wounds on the right medial foot, left lateral foot and a small area on the right medial great toe. Right medial foot wound has been there for 6 to 7  months, left foot wound for 2 to 3 months and the right great toe only over the last few weeks. She has been followed by [name] at Dr. Laverta [place] office it sounds as though she has been undergoing callus paring and silver nitrate. The patient has been washing these off with soap and water and plan applying dry dressing Past medical history, type 2 diabetes well controlled with a recent hemoglobin A1c of 7 on 11/16, type 2 diabetes with peripheral neuropathy, previous left Charcot joint surgery bilateral Charcot joints currently, stage III chronic renal failure, hypertension, obstructive sleep apnea, history of MRSA and gastroesophageal reflux. 1/18; this is a patient with bilateral Charcot feet. Plain x-rays that I did of both of these wounds that are either at bone or precariously close to bone were done. On the right foot this showed possible lytic destruction involving the anterior aspect of the talus which may represent a wound osteomyelitis. An MRI was recommended. On the left there was no definite lytic destruction although the wound is deep undermining's and I think probably requires an MRI on this basis in any case 1/25; patient was supposed to have her MRI last Friday of her bilateral feet however they would not do it because there was silver alginate in her dressings, will replace with calcium alginate today and will see if we can get her done today 2/1; patient did not get her MRIs done last week because of issues with the MRI department receiving orders to do bilateral feet. She has 2 deep wounds in the setting of Charcot feet deformities bilaterally. Both of them at one point had exposed bone although I had trouble demonstrating that today. I am not sure that we can offload these  very aggressively as I watched her leave the clinic last week her gait is very narrow and unsteady. 2/15; the MRI of her right foot did not show osteomyelitis potentially osteonecrosis of the talus. However  on the left foot there was suggestive findings of osteomyelitis in the calcaneus. The wounds are a large wound on the right medial foot and on the left a substantial wound on the left lateral plantar calcaneus. We have been using silver alginate. Ultimately I think we need to consider a total contact cast on the right while we work through the possibility of osteomyelitis in the left. I watched her walk out with her crutches I have some trepidation about the ability to walk with the cast on her right foot. Nevertheless with her Charcot deformities I do not think there is a way to heal these short of offloading them aggressively. 2/22; the culture of the bone in the left foot that I did last week showed a few Citrobacter Koseri as well as some streptococcal species. In my attempted bone for pathology did not actually yield a lot of bone the pathologist did not identify any osteomyelitis. 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 Is promised today and aggressive debridement of the right foot and a total contact cast which surprisingly she seemed to tolerate quite well 03/13/2019 upon evaluation today patient appears to be doing well with her total contact cast she is here for the first cast change. She had no areas of rubbing or any other complications at this point. Overall things are doing quite well. 2/26; the patient was kindly seen by Dr. Orvan Falconer of infectious disease on 2/25. She is now on IV vancomycin PICC line placement in 2 days and oral Flagyl. This was in response to her MRI showing osteomyelitis of the left calcaneus concerning for osteomyelitis. We have been putting her right leg in a total contact cast. Our nurses report drainage. She is tolerating the total contact cast well. 3/4; the patient is started her IV antibiotics and is taking IV  vancomycin and oral Flagyl. She appears to be tolerating these both well. This is for osteomyelitis involving the left calcaneus. The area on the right in the setting of bilateral Charcot deformities did not have any bone infection on MRI. We put a total contact cast on her with silver alginate as the primary dressing and she returns today in follow-up. Per our intake nurse there was too much drainage to leave this on all week therefore we will bring her back for an early change 3/8; follow-up total contact cast she is still having a lot of drainage probably too much drainage from the right foot wound will week with the same cast. She will be back on Thursday. The area on the left looks somewhat better 3/11; patient here for a total contact cast change. 3/15; patient came in for wound care evaluation. She is still on IV vancomycin and oral Flagyl for osteomyelitis in the left foot. The area on the right foot we are putting in a total contact cast. 3/18 for total contact cast change on the right foot 3/22; the patient was here for wound review today. The area on the right foot is slightly smaller in terms of surface area. However the surface of the wound is very gritty and hyper granulated. More problematically the area  on the plantar left foot has increased depth indeed in the center of this it probes to bone. We have been using Hydrofera Blue in both areas. She is on antibiotics as prescribed by infectious disease IV vancomycin and oral Flagyl 3/25; the patient is in for total contact cast change. Our intake nurse was concerned about the amount of drainage which soaked right to the multiple layers we are putting on this. Wondered about options. The wound bed actually looks as healthy as this is looked although there may not be a lot of change in dimensions things are looking a lot better. If we are going to heal this woman's wound which is in the middle of her right Charcot foot this is going to  need to be offloaded. There are not many alternatives 3/29; still not a lot of improvement although the surface of the right wound looks better. We have been using Hydrofera Blue 04/17/2019 upon evaluation today patient appears to be doing well with a total contact cast. With actually having to change this twice a week. She saw [name] for wound care earlier in the week this is for the cast change today. That is due to the fact that she has significant drainage. Overall the wound is been looking excellent however. 4/5; we continue to put a total contact cast on this patient with Hydrofera Blue. Still a lot of drainage which precludes a simple weekly change or changing this twice a week. 4/8; total contact cast changed today. Apparently the wound looks better per our intake nurse 4/12; patient here for wound care evaluation. Wounds on the right foot have not changed in dimension none about a month left foot looks similar. Apparently her antibiotics that we are giving her for osteomyelitis in the left foot complete on Wednesday. We have been using Hydrofera Blue on the right foot under a total contact cast. Nurses report greenish drainage although I think this may be discoloration from the [facility] 4/15; back for cast change wounds look quite good although still moderate amount of drainage. T contact cast reapplied otal 4/19;. Wound evaluation. Wound on the right foot is smaller surface looks healthy. There is still the divot in the middle part of this wound but I could not feel any bone. Area on the left foot also looks better She has completed her antibiotics but still has the PICC line in. 4/23; patient comes back for a total contact cast change this was done in the standard fashion no issues 4/26; wound measures slightly larger on the right foot which is disappointing. She sees Dr. Orvan Falconer with regards to her antibiotics on Wednesday. I believe she is on vancomycin IV and oral  Flagyl. 4/29; in for her routine total contact cast change. She saw Dr. Orvan Falconer this morning. He is asking about antibiotic continuation I will be in touch with him. I did not look at her wound today 5/3; patient saw Dr. Orvan Falconer on 4/28. I did send him a note asking about the continuation of perhaps an oral regimen. I have not heard back. She still has an elevated sedimentation rate at 81. Her wounds are smaller. 5/6; in for her total contact cast change. We did not look at the wound today. She has seen Dr. Orvan Falconer and is now on oral Keflex and Flagyl. She seems to be tolerating these well 5/10; she had her PICC line removed. The wound on the right foot after some initial improvement has not made any improvement even with  the total contact cast. Still having a lot of drainage. Likewise the area on the left foot really is not any better either 5/14; in for a cast change today. We will look at the right foot on Monday. If this is not making any progress I am going to try to cast the left foot. 5/17; the wound on the right foot is absolutely no different. I am going to put ointment on this area and change the cast of the left foot. We have been using silver alginate there 5/21; apparently the area on the right foot was better per our intake nurse although I did not see it. We did a standard total contact cast change on the left foot we have been applying silver alginate to the wound here. 06/10/19-Area on the right foot per dimensions slightly better, the left foot has still some depth applying silver alginate to the wound with TCC 06/13/19-Patient here for Casting to Left foot 6/1; patient had the total contact cast were placed on the left foot. Noted some swelling in her right leg although the dimensions of the wound on the right foot are better. We have been using PolyMem on the right and silver alginate on the left under the cast 6/7; we continue to make nice progress in both her plantar feet  wound. We are putting the total contact cast on the left. Surprisingly in spite of this the area on the right medial foot in the middle of her Charcot deformities actually is making progress as well 07/01/2019 upon evaluation today patient actually appears to be doing quite well with regard to her wounds on the feet compared to when I saw her last that has been quite some time. With that being said I do believe the total contact cast has been beneficial in this left foot and she has a lot of signs of improvement she does have some callus overhanging which is caused a little bit of a ledge and an issue here. I do believe that we may be able to do something to help in that regard. Fortunately otherwise the wound seems to be progressing quite nicely. 6/21; the patient's area on the left foot that we are currently casting is just about closed. The area on the right medial foot has a healthy looking surface but may be not changed as much from last week in terms of surface area. We are using silver alginate on the right and polymen Ag under the cast 6/28; only a small open area on the left foot remains. Using silver alginate under a total contact cast. She has a diabetic shoe I have asked her to bring that for next week where this may be closed. The area on the right foot with a Charcot deformity is also measuring slightly smaller. We are using PolyMem Ag here 7/6; left foot is totally closed. She does not require a total contact cast. She will graduate to her own diabetic shoe. ooThe area on the right foot with a Charcot deformity measuring about the same. We have been using polymen under compression wraps. She did not do well in this area with a total contact cast 7/13; left foot remains intact. She is skillfully covering this area with foam and using her diabetic shoe. Slight area improvement of the right foot wound. Still rolled senescent looking edges. We have been using  polymen Objective Constitutional Vitals Time Taken: 2:00 PM, Height: 68 in, Weight: 265 lbs, BMI: 40.3, Temperature: 98 F, Pulse: 71 bpm,  Respiratory Rate: 18 breaths/min, Blood Pressure: 130/74 mmHg. Integumentary (Hair, Skin) Wound #9 status is Open. Original cause of wound was Blister. The wound is located on the Right,Medial Foot. The wound measures 2.2cm length x 2.2cm width x 0.3cm depth; 3.801cm^2 area and 1.14cm^3 volume. There is Fat Layer (Subcutaneous Tissue) Exposed exposed. There is no tunneling noted, however, there is undermining starting at 3:00 and ending at 6:00 with a maximum distance of 0.5cm. There is a medium amount of serosanguineous drainage noted. The wound margin is well defined and not attached to the wound base. There is large (67-100%) red, pink granulation within the wound bed. There is no necrotic tissue within the wound bed. 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 There was a Excisional Skin/Subcutaneous Tissue Debridement with a total area of 4.84 sq cm performed by [name], MD. With the following instrument(s): Curette to remove Viable and Non-Viable tissue/material. Material removed includes Callus and Subcutaneous Tissue and. No specimens were taken. A time out was conducted at 14:26, prior to the start of  the procedure. A Moderate amount of bleeding was controlled with Pressure. The procedure was tolerated well with a pain level of 0 throughout and a pain level of 0 following the procedure. Post Debridement Measurements: 2.2cm length x 2.2cm width x 0.3cm depth; 1.14cm^3 volume. Character of Wound/Ulcer Post Debridement is improved. Severity of Tissue Post Debridement is: Fat layer exposed. Post procedure Diagnosis Wound #9: Same as Pre-Procedure Pre-procedure diagnosis of Wound #9 is a Diabetic Wound/Ulcer of the Lower Extremity located on the Right,Medial Foot . There was a Three Layer Compression Therapy Procedure by Zandra Abts, RN. Post procedure Diagnosis Wound #9: Same as Pre-Procedure Plan Follow-up Appointments: Return Appointment in 1 week. Dressing Change Frequency: Wound #9 Right,Medial Foot: Do not change entire dressing for one week. Wound Cleansing: Wound #9 Right,Medial Foot: May shower with protection. - use cast protector Primary Wound Dressing: Wound #9 Right,Medial Foot: Polymem Silver Secondary Dressing: Wound #9 Right,Medial Foot: Foam - foam donut Dry Gauze ABD pad Zetuvit or Kerramax Edema Control: 3 Layer Compression System - Right Lower Extremity Avoid standing for long periods of time Elevate legs to the level of the heart or above for 30 minutes daily and/or when sitting, a frequency of: - throughout the day Exercise regularly Off-Loading: Open toe surgical shoe to: - right foot 1. Polymen Ag ABDs 3 layer compression. Electronic Signature(s) Signed: 07/30/2019 8:12:39 AM By: Baltazar Najjar MD Entered By: Baltazar Najjar on 07/29/2019 14:45:11 -------------------------------------------------------------------------------- SuperBill Details Patient Name: Date of Service: [facility], Barbaraann J. 07/29/2019 Medical Record Number: [id number] Patient Account Number: [id number] Date of Birth/Sex: Treating RN: [id number] (64 y.o. [name] Primary  Care Provider: MA Hedy Jacob MA LSA DA T Other Clinician: Referring Provider: Treating Provider/Extender: Abe People, ELHA MA LSA DA T Weeks in Treatment: 26 Diagnosis Coding ICD-10 Codes Code Description [id number]  Type 2 diabetes mellitus with foot ulcer L97.524 Non-pressure chronic ulcer of other part of left foot with necrosis of bone L97.511 Non-pressure chronic ulcer of other part of right foot limited to breakdown of skin L97.514 Non-pressure chronic ulcer of other part of right foot with necrosis of bone E11.42 Type 2 diabetes mellitus with diabetic polyneuropathy M14.671 Charcot's joint, right ankle and foot M14.672 Charcot's joint, left ankle and foot 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, left ankle and foot Facility Procedures CPT4 Code: [id number] Description: 11042 - DEB SUBQ TISSUE 20 SQ CM/< ICD-10 Diagnosis Description L97.511 Non-pressure chronic ulcer of other part of right foot limited to breakdown of Modifier: skin Quantity: 1 Physician Procedures : CPT4 Code Description Modifier [id number] 11042 - WC PHYS SUBQ TISS 20 SQ CM ICD-10 Diagnosis Description L97.511 Non-pressure chronic ulcer of other part of right foot limited to breakdown of skin Quantity: 1 Electronic Signature(s) Signed: 07/30/2019 8:12:39 AM By: Baltazar Najjar MD Entered By: Baltazar Najjar on 07/29/2019 14:45:32

## 2019-07-31 NOTE — Progress Notes (Signed)
[name], [name] ([id number]) Visit Report for 07/23/2019 Arrival Information Details Patient Name: Date of Service: [name], [name] 07/23/2019 1:30 PM Medical Record Number: [id number] Patient Account Number: [id number] Date of Birth/Sex: Treating RN: 10-13-1955 (64 y.o. [name] Primary Care Kaedyn Polivka: MA SO UDI, [place] MA LSA DA T Other Clinician: Referring Enya Bureau: Treating Vander Kueker/Extender: Baltazar Najjar MA SO UDI, ELHA MA LSA DA T Weeks in Treatment: 25 Visit Information History Since Last Visit Added or deleted any medications: No Patient Arrived: Crutches Any new allergies or adverse reactions: No Arrival Time: 13:38 Had a fall or experienced change in No Accompanied By: self activities of daily living that may affect Transfer Assistance: None risk of falls: Patient Identification Verified: Yes Signs or symptoms of abuse/neglect since last visito No Secondary Verification Process Completed: Yes Hospitalized since last visit: No Patient Requires Transmission-Based Precautions: No Implantable device outside of the clinic excluding No Patient Has Alerts: No cellular tissue based products placed in the center since last visit: Has Dressing in Place as Prescribed: Yes Pain Present Now: No Electronic Signature(s) Signed: 07/26/2019 2:13:12 PM By: Karl Ito Entered By: Karl Ito on 07/23/2019 13:41:53 -------------------------------------------------------------------------------- Compression Therapy Details Patient Name: Date of Service: Sarah [name], [name] 07/23/2019 1:30 PM Medical Record Number: [id number] Patient Account Number: [id number] Date of Birth/Sex: Treating RN: February 13, 1955 (64 y.o. [name] Primary Care Abas Leicht: MA SO UDI, Judene Companion MA LSA DA T Other Clinician: Referring Luceal Hollibaugh: Treating Antion Andres/Extender: Baltazar Najjar MA SO UDI, ELHA MA LSA DA T Weeks in Treatment: 25 Compression Therapy Performed for Wound Assessment:  Wound #9 Right,Medial Foot Performed By: Clinician Zandra Abts, RN Compression Type: Three Layer Post Procedure Diagnosis Same as Pre-procedure Electronic Signature(s) Signed: 07/23/2019 5:26:11 PM By: Zandra Abts RN, BSN Entered By: Zandra Abts on 07/23/2019 14:51:27 -------------------------------------------------------------------------------- Encounter Discharge Information Details Patient Name: Date of Service: [name], [name] 07/23/2019 1:30 PM Medical Record Number: [id number] Patient Account Number: [id number] Date of Birth/Sex: Treating RN: [birth year] (64 y.o. F) [name] Primary Care Ekaterini Capitano: MA SO UDI, [place] MA LSA DA T Other Clinician: Referring Maryah Marinaro: Treating Kanisha Duba/Extender: Baltazar Najjar MA SO UDI, ELHA MA LSA DA T Weeks in Treatment: 25 Encounter Discharge Information Items Post Procedure Vitals Discharge Condition: Stable Temperature (F): 97.6 Ambulatory Status: Crutches Pulse (bpm): 80 Discharge Destination: Home Respiratory Rate (breaths/min): 18 Transportation: Private Auto Blood Pressure (mmHg): 153/72 Accompanied By: self Schedule Follow-up Appointment: Yes Clinical Summary of Care: Patient Declined Electronic Signature(s) Signed: 07/23/2019 5:18:40 PM By: [name] Entered By: [name] on 07/23/2019 15:20:20 -------------------------------------------------------------------------------- Lower Extremity Assessment Details Patient Name: Date of Service: Sarah [facility], [name] 07/23/2019 1:30 PM Medical Record Number: [id number] Patient Account Number: [id number] Date of Birth/Sex: Treating RN: Oct 03, 1955 (64 y.o. F) [name] Primary Care Breianna Delfino: MA SO UDI, [place] MA LSA DA T Other Clinician: Referring Brinklee Cisse: Treating Tomaz Janis/Extender: Baltazar Najjar MA SO UDI, ELHA MA LSA DA T Weeks in Treatment: 25 Edema Assessment Assessed: [Left: No] [Right: No] Edema: [Left: Yes] [Right: Yes] Calf Left:  Right: Point of Measurement: 43 cm From Medial Instep 34 cm 35 cm Ankle Left: Right: Point of Measurement: 12 cm From Medial Instep 24 cm 22 cm Vascular Assessment Pulses: Dorsalis Pedis Palpable: [Left:Yes] [Right:Yes] Electronic Signature(s) Signed: 07/23/2019 5:18:40 PM By: [name] Entered By: [name] on 07/23/2019 14:19:47 -------------------------------------------------------------------------------- Multi Wound Chart Details Patient Name: Date of Service: [name], [name] 07/23/2019 1:30 PM Medical Record Number: [id number] Patient Account Number: [id number] Date  of Birth/Sex: Treating RN: 1955-06-19 (64 y.o. [name] Primary Care Duana Benedict: MA SO UDI, ELHA MA LSA DA T Other Clinician: Referring Shahida Schnackenberg: Treating Yuliya Nova/Extender: Baltazar Najjar MA SO UDI, ELHA MA LSA DA T Weeks in Treatment: 25 Vital Signs Height(in): 68 Pulse(bpm): 80 Weight(lbs): 265 Blood Pressure(mmHg): 153/72 Body Mass Index(BMI): 40 Temperature(F): 97.6 Respiratory Rate(breaths/min): 18 Photos: [10:No Photos Left, Lateral, Plantar Foot] [9:No Photos Right, Medial Foot] [N/A:N/A N/A] Wound Location: [10:Blister] [9:Blister] [N/A:N/A] Wounding Event: [10:Diabetic Wound/Ulcer of the Lower] [9:Diabetic Wound/Ulcer of the Lower] [N/A:N/A] Primary Etiology: [10:Extremity Glaucoma, Chronic sinus] [9:Extremity Glaucoma, Chronic sinus] [N/A:N/A] Comorbid History: [10:problems/congestion, Anemia, Sleep Apnea, Hypertension, Type II Diabetes, Rheumatoid Arthritis, Neuropathy 11/18/2018] [9:problems/congestion, Anemia, Sleep Apnea, Hypertension, Type II Diabetes, Rheumatoid Arthritis, Neuropathy  06/18/2018] [N/A:N/A] Date Acquired: [10:25] [9:25] [N/A:N/A] Weeks of Treatment: [10:Healed - Epithelialized] [9:Open] [N/A:N/A] Wound Status: [10:0x0x0] [9:2.5x2.2x0.3] [N/A:N/A] Measurements L x W x D (cm) [10:0] [9:4.32] [N/A:N/A] A (cm) : rea [10:0] [9:1.296] [N/A:N/A] Volume  (cm) : [10:100.00%] [9:60.70%] [N/A:N/A] % Reduction in A [10:rea: 100.00%] [9:86.90%] [N/A:N/A] % Reduction in Volume: [10:Grade 2] [9:Grade 2] [N/A:N/A] Classification: [10:None Present] [9:Medium] [N/A:N/A] Exudate A mount: [10:N/A] [9:Serosanguineous] [N/A:N/A] Exudate Type: [10:N/A] [9:red, brown] [N/A:N/A] Exudate Color: [10:Distinct, outline attached] [9:Well defined, not attached] [N/A:N/A] Wound Margin: [10:None Present (0%)] [9:Large (67-100%)] [N/A:N/A] Granulation A mount: [10:N/A] [9:Red, Pink] [N/A:N/A] Granulation Quality: [10:None Present (0%)] [9:None Present (0%)] [N/A:N/A] Necrotic A mount: [10:Fascia: No] [9:Fat Layer (Subcutaneous Tissue)] [N/A:N/A] Exposed Structures: [10:Fat Layer (Subcutaneous Tissue) Exposed: No Tendon: No Muscle: No Joint: No Bone: No Large (67-100%)] [9:Exposed: Yes Fascia: No Tendon: No Muscle: No Joint: No Bone: No Small (1-33%)] [N/A:N/A] Epithelialization: [10:N/A] [9:Debridement - Excisional] [N/A:N/A] Debridement: Pre-procedure Verification/Time Out N/A [9:14:45] [N/A:N/A] Taken: [10:N/A] [9:Callus, Subcutaneous] [N/A:N/A] Tissue Debrided: [10:N/A] [9:Skin/Subcutaneous Tissue] [N/A:N/A] Level: [10:N/A] [9:5.5] [N/A:N/A] Debridement A (sq cm): [10:rea N/A] [9:Curette] [N/A:N/A] Instrument: [10:N/A] [9:Minimum] [N/A:N/A] Bleeding: [10:N/A] [9:Pressure] [N/A:N/A] Hemostasis A chieved: [10:N/A] [9:0] [N/A:N/A] Procedural Pain: [10:N/A] [9:0] [N/A:N/A] Post Procedural Pain: [10:N/A] [9:Procedure was tolerated well] [N/A:N/A] Debridement Treatment Response: [10:N/A] [9:2.5x2.2x0.3] [N/A:N/A] Post Debridement Measurements L x W x D (cm) [10:N/A] [9:1.296] [N/A:N/A] Post Debridement Volume: (cm) [10:N/A] [9:Compression Therapy] [N/A:N/A] Procedures Performed: [9:Debridement] Treatment Notes Electronic Signature(s) Signed: 07/23/2019 5:09:59 PM By: Baltazar Najjar MD Signed: 07/23/2019 5:26:11 PM By: Zandra Abts RN, BSN Entered By: Baltazar Najjar on 07/23/2019 14:54:59 -------------------------------------------------------------------------------- Multi-Disciplinary Care Plan Details Patient Name: Date of Service: [facility], [name] 07/23/2019 1:30 PM Medical Record Number: [id number] Patient Account Number: [id number] Date of Birth/Sex: Treating RN: May 19, 1955 (64 y.o. [name] Primary Care Nimai Burbach: MA SO UDI, Judene Companion MA LSA DA T Other Clinician: Referring Abrian Hanover: Treating Posey Jasmin/Extender: Baltazar Najjar MA SO UDI, ELHA MA LSA DA T Weeks in Treatment: 25 Active Inactive Wound/Skin Impairment Nursing Diagnoses: Impaired tissue integrity Knowledge deficit related to ulceration/compromised skin integrity Goals: Patient/caregiver will verbalize understanding of skin care regimen Date Initiated: 01/28/2019 Target Resolution Date: 08/09/2019 Goal Status: Active Interventions: Assess patient/caregiver ability to obtain necessary supplies Assess patient/caregiver ability to perform ulcer/skin care regimen upon admission and as needed Assess ulceration(s) every visit Provide education on ulcer and skin care Notes: Electronic Signature(s) Signed: 07/23/2019 5:26:11 PM By: Zandra Abts RN, BSN Entered By: Zandra Abts on 07/23/2019 13:54:57 -------------------------------------------------------------------------------- Pain Assessment Details Patient Name: Date of Service: [name], [name] 07/23/2019 1:30 PM Medical Record Number: [id number] Patient Account Number: [id number] Date of Birth/Sex: Treating RN: 1955/12/02 (64 y.o. [name] Primary Care Marly Schuld: MA SO  UDI, Judene Companion MA LSA DA T Other Clinician: Referring Jefferey Lippmann: Treating Lillion Elbert/Extender: Baltazar Najjar MA SO UDI, ELHA MA LSA DA T Weeks in Treatment: 25 Active Problems Location of Pain Severity and Description of Pain Patient Has Paino No Site Locations Pain Management and Medication Current Pain Management: Electronic  Signature(s) Signed: 07/23/2019 5:26:11 PM By: Zandra Abts RN, BSN Signed: 07/26/2019 2:13:12 PM By: Karl Ito Entered By: Karl Ito on 07/23/2019 13:42:21 -------------------------------------------------------------------------------- Patient/Caregiver Education Details Patient Name: Date of Service: Sarah [name] 7/6/2021andnbsp1:30 PM Medical Record Number: [id number] Patient Account Number: [id number] Date of Birth/Gender: Treating RN: [birth year] (64 y.o. [name] Primary Care Physician: MA SO UDI, Judene Companion MA LSA DA T Other Clinician: Referring Physician: Treating Physician/Extender: Baltazar Najjar MA SO UDI, ELHA MA LSA DA T Weeks in Treatment: 25 Education Assessment Education Provided To: Patient Education Topics Provided Wound/Skin Impairment: Methods: Explain/Verbal Responses: State content correctly [employer]) Signed: 07/23/2019 5:26:11 PM By: Zandra Abts RN, BSN Entered By: Zandra Abts on 07/23/2019 13:55:11 -------------------------------------------------------------------------------- Wound Assessment Details Patient Name: Date of Service: [name], [name] 07/23/2019 1:30 PM Medical Record Number: [id number] Patient Account Number: [id number] Date of Birth/Sex: Treating RN: [birth year] (64 y.o. [name] Primary Care Perline Awe: MA SO UDI, [place] MA LSA DA T Other Clinician: Referring Miley Lindon: Treating Audre Cenci/Extender: Baltazar Najjar MA SO UDI, ELHA MA LSA DA T Weeks in Treatment: 25 Wound Status Wound Number: 10 Primary Diabetic Wound/Ulcer of the Lower Extremity Etiology: Wound Location: Left, Lateral, Plantar Foot Wound Healed - Epithelialized Wounding Event: Blister Status: Date Acquired: 11/18/2018 Comorbid Glaucoma, Chronic sinus problems/congestion, Anemia, Sleep Weeks Of Treatment: 25 History: Apnea, Hypertension, Type II Diabetes, Rheumatoid Arthritis, Clustered Wound: No  Neuropathy Photos Photo Uploaded By: Benjaman Kindler on 07/24/2019 13:13:34 Wound Measurements Length: (cm) Width: (cm) Depth: (cm) Area: (cm) Volume: (cm) 0 % Reduction in Area: 100% 0 % Reduction in Volume: 100% 0 Epithelialization: Large (67-100%) 0 Tunneling: No 0 Undermining: No Wound Description Classification: Grade 2 Wound Margin: Distinct, outline attached Exudate Amount: None Present Foul Odor After Cleansing: No Slough/Fibrino No Wound Bed Granulation Amount: None Present (0%) Exposed Structure Necrotic Amount: None Present (0%) Fascia Exposed: No Fat Layer (Subcutaneous Tissue) Exposed: No Tendon Exposed: No Muscle Exposed: No Joint Exposed: No Bone Exposed: No Electronic Signature(s) Signed: 07/23/2019 5:26:11 PM By: Zandra Abts RN, BSN Entered By: Zandra Abts on 07/23/2019 14:44:47 -------------------------------------------------------------------------------- Wound Assessment Details Patient Name: Date of Service: [name], Liisa J. 07/23/2019 1:30 PM Medical Record Number: [id number] Patient Account Number: [id number] Date of Birth/Sex: Treating RN: [birth year] (64 y.o. [name] Primary Care Rocio Roam: MA SO UDI, [place] MA LSA DA T Other Clinician: Referring Sherwin Hollingshed: Treating Seher Schlagel/Extender: Baltazar Najjar MA SO UDI, ELHA MA LSA DA T Weeks in Treatment: 25 Wound Status Wound Number: 9 Primary Diabetic Wound/Ulcer of the Lower Extremity Etiology: Wound Location: Right, Medial Foot Wound Open Wounding Event: Blister Status: Date Acquired: 06/18/2018 Date Acquired: 06/18/2018 Comorbid Glaucoma, Chronic sinus problems/congestion, Anemia, Sleep Weeks Of Treatment: 25 History: Apnea, Hypertension, Type II Diabetes, Rheumatoid Arthritis, Clustered Wound: No Neuropathy Photos Photo Uploaded By: Benjaman Kindler on 07/24/2019 13:13:57 Wound Measurements Length: (cm) 2.5 Width: (cm) 2.2 Depth: (cm) 0.3 Area: (cm) 4.32 Volume: (cm)  1.296 % Reduction in Area: 60.7% % Reduction in Volume: 86.9% Epithelialization: Small (1-33%) Tunneling: No Undermining: No Wound Description Classification: Grade 2 Wound Margin: Well defined, not attached Exudate Amount: Medium Exudate Type: Serosanguineous Exudate Color: red, brown Foul Odor After Cleansing:  No Slough/Fibrino No Wound Bed Granulation Amount: Large (67-100%) Exposed Structure Granulation Quality: Red, Pink Fascia Exposed: No Necrotic Amount: None Present (0%) Fat Layer (Subcutaneous Tissue) Exposed: Yes Tendon Exposed: No Muscle Exposed: No Joint Exposed: No Bone Exposed: No Treatment Notes Wound #9 (Right, Medial Foot) 1. Cleanse With Wound Cleanser Soap and water 2. Periwound Care Moisturizing lotion 3. Primary Dressing Applied Polymem Ag 4. Secondary Dressing ABD Pad Dry Gauze Foam Kerramax/Xtrasorb 6. Support Layer Applied 3 layer compression wrap Notes zetuvit. Public house manager Signature(s) Signed: 07/23/2019 5:18:40 PM By: [name] Signed: 07/23/2019 5:26:11 PM By: Zandra Abts RN, BSN Entered By: [name] on 07/23/2019 14:20:33 -------------------------------------------------------------------------------- Vitals Details Patient Name: Date of Service: [name], [name] J. 07/23/2019 1:30 PM Medical Record Number: [id number] Patient Account Number: [id number] Date of Birth/Sex: Treating RN: 1955/10/12 (64 y.o. [name] Primary Care Florentina Marquart: MA SO UDI, [place] MA LSA DA T Other Clinician: Referring Chenille Toor: Treating Leanny Moeckel/Extender: Baltazar Najjar MA SO UDI, ELHA MA LSA DA T Weeks in Treatment: 25 Vital Signs Time Taken: 13:41 Temperature (F): 97.6 Height (in): 68 Pulse (bpm): 80 Weight (lbs): 265 Respiratory Rate (breaths/min): 18 Body Mass Index (BMI): 40.3 Blood Pressure (mmHg): 153/72 Reference Range: 80 - 120 mg / dl Electronic Signature(s) Signed: 07/26/2019 2:13:12 PM By: Karl Ito Entered By: Karl Ito on 07/23/2019 13:42:14

## 2019-08-05 ENCOUNTER — Encounter (HOSPITAL_BASED_OUTPATIENT_CLINIC_OR_DEPARTMENT_OTHER): Payer: Medicare Other | Admitting: Internal Medicine

## 2019-08-05 DIAGNOSIS — E11621 Type 2 diabetes mellitus with foot ulcer: Secondary | ICD-10-CM | POA: Diagnosis not present

## 2019-08-06 NOTE — Progress Notes (Signed)
[name], [name] ([id number]) Visit Report for 08/05/2019 Debridement Details Patient Name: Date of Service: [name], [name] 08/05/2019 1:30 PM Medical Record Number: [id number] Patient Account Number: [id number] Date of Birth/Sex: Treating RN: [birth year] (64 y.o. [name] Primary Care Provider: MA SO UDI, [place] MA LSA DA T Other Clinician: Referring Provider: Treating Provider/Extender: Abe People, ELHA MA LSA DA T Weeks in Treatment: 27 Debridement Performed for Assessment: Wound #9 Right,Medial Foot Performed By: Physician [name], MD Debridement Type: Debridement Severity of Tissue Pre Debridement: Fat layer exposed Level of Consciousness (Pre-procedure): Awake and Alert Pre-procedure Verification/Time Out Yes - 14:08 Taken: Start Time: 14:08 T Area Debrided (L x W): otal 2.1 (cm) x 1.9 (cm) = 3.99 (cm) Tissue and other material debrided: Viable, Non-Viable, Slough, Subcutaneous, Slough Level: Skin/Subcutaneous Tissue Debridement Description: Excisional Instrument: Curette Bleeding: Minimum Hemostasis Achieved: Pressure End Time: 14:09 Procedural Pain: 0 Post Procedural Pain: 0 Response to Treatment: Procedure was tolerated well Level of Consciousness (Post- Awake and Alert procedure): Post Debridement Measurements of Total Wound Length: (cm) 2.1 Width: (cm) 1.9 Depth: (cm) 0.3 Volume: (cm) 0.94 Character of Wound/Ulcer Post Debridement: Improved Severity of Tissue Post Debridement: Fat layer exposed Post Procedure Diagnosis Same as Pre-procedure Electronic Signature(s) Signed: 08/05/2019 4:12:59 PM By: Zandra Abts RN, BSN Signed: 08/06/2019 6:00:01 PM By: Baltazar Najjar MD Entered By: Baltazar Najjar on 08/05/2019 14:13:06 -------------------------------------------------------------------------------- HPI Details Patient Name: Date of Service: [name], [name] 08/05/2019 1:30 PM Medical Record Number:  [id number] Patient Account Number: [id number] Date of Birth/Sex: Treating RN: 1955/07/23 (64 y.o. [name] Primary Care Provider: MA SO UDI, [place] MA LSA DA T Other Clinician: Referring Provider: Treating Provider/Extender: Baltazar Najjar MA SO UDI, ELHA MA LSA DA T Weeks in Treatment: 27 History of Present Illness HPI Description: 64 yrs old bf here for follow up recurrent left charcot foot ulcer. She has DM. She is not a smoker. She states a blister developed 3 months ago at site of previous breakdown from 1 year ago. It was debrided at the podiatrist's office. She went to the ER and then sent here. She denies pain. Xray was negative for osteo. Culture from this clinic negative. 09/08/14 [name] to [name] for Charcot foot. Seen by him and MRI scheduled 09/19/14. Prior silver collagen but this was not ordered last visit, patient has been rinsing with saline alone. Re institute silver collagen every other day. F/u 2 weeks with [name] as Labor Day holiday. Supplies ordered. 09/25/14; this is a patient with a Charcot foot on the left. she has a wound over the plantar aspect of her left calcaneus. She has been seen by [name] of orthopedic surgery and has had an MRI. She is apparently going within the next week or 2 for corrective surgery which will involve an external fixator. I don't have any of the details of this. 10/06/14; The patient is a 64 yrs old bf with a left Charcot foot and ulcer on the plantar. 12/01/14 Returns post surgery with [name]. Has follow up visit later this week with him, currently in facility and NWB over this extremity. States no drainage from foot and no current wound care. On exam callus and scab in place, suspect healed. Has external fixator in place. Will defer to [name] for management, ok to place moisturizing cream over scabs and callus and she will call if she requires follow up here. READMISSION 01/28/2019 Patient was in  the  clinic in 2016 for a wound on the plantar aspect of her left calcaneus. Predominantly cared for by [name] she was referred to [name] and had corrective surgery for the position of her left ankle I believe. She had previously been here in 2015 and then prior to that in 2011 2012 that I do not have these records. More recently she has developed fairly extensive wounds on the right medial foot, left lateral foot and a small area on the right medial great toe. Right medial foot wound has been there for 6 to 7 months, left foot wound for 2 to 3 months and the right great toe only over the last few weeks. She has been followed by [name] at Dr. Laverta [place] office it sounds as though she has been undergoing callus paring and silver nitrate. The patient has been washing these off with soap and water and plan applying dry dressing Past medical history, type 2 diabetes well controlled with a recent hemoglobin A1c of 7 on 11/16, type 2 diabetes with peripheral neuropathy, previous left Charcot joint surgery bilateral Charcot joints currently, stage III chronic renal failure, hypertension, obstructive sleep apnea, history of MRSA and gastroesophageal reflux. 1/18; this is a patient with bilateral Charcot feet. Plain x-rays that I did of both of these wounds that are either at bone or precariously close to bone were done. On the right foot this showed possible lytic destruction involving the anterior aspect of the talus which may represent a wound osteomyelitis. An MRI was recommended. On the left there was no definite lytic destruction although the wound is deep undermining's and I think probably requires an MRI on this basis in any case 1/25; patient was supposed to have her MRI last Friday of her bilateral feet however they would not do it because there was silver alginate in her dressings, will replace with calcium alginate today and will see if we can get her done today 2/1; patient did not get  her MRIs done last week because of issues with the MRI department receiving orders to do bilateral feet. She has 2 deep wounds in the setting of Charcot feet deformities bilaterally. Both of them at one point had exposed bone although I had trouble demonstrating that today. I am not sure that we can offload these very aggressively as I watched her leave the clinic last week her gait is very narrow and unsteady. 2/15; the MRI of her right foot did not show osteomyelitis potentially osteonecrosis of the talus. 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 The wounds are a large wound on the right medial foot and on the left a substantial wound on the left lateral plantar calcaneus. We have been using silver alginate. Ultimately I think we need to consider a total contact cast on the right while we work through the possibility of osteomyelitis in the left. I watched her walk out with her crutches I have some trepidation about the ability to walk with the cast on her right foot. Nevertheless with her Charcot deformities I do not think there is a way to heal these short of offloading them aggressively. 2/22; the culture of the bone in the left foot that I did last week showed a few Citrobacter Koseri as well as some streptococcal species. In my attempted bone for pathology did not actually yield a lot of bone the pathologist did not identify any osteomyelitis. 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 Is promised today and aggressive debridement of the right foot and a total contact cast which surprisingly she seemed to tolerate quite well 03/13/2019 upon evaluation today patient appears to be doing well with her total contact cast she is here for the first cast change. She had no areas of rubbing or any other  complications at this point. Overall things are doing quite well. 2/26; the patient was kindly seen by Dr. Orvan Falconer of infectious disease on 2/25. She is now on IV vancomycin PICC line placement in 2 days and oral Flagyl. This was in response to her MRI showing osteomyelitis of the left calcaneus concerning for osteomyelitis. We have been putting her right leg in a total contact cast. Our nurses report drainage. She is tolerating the total contact cast well. 3/4; the patient is started her IV antibiotics and is taking IV vancomycin and oral Flagyl. She appears to be tolerating these both well. This is for osteomyelitis involving the left calcaneus. The area on the right in the setting of bilateral Charcot deformities did not have any bone infection on MRI. We put a total contact cast on her with silver alginate as the primary dressing and she returns today in follow-up. Per our intake nurse there was too much drainage to leave this on all week therefore we will bring her back for an early change 3/8; follow-up total contact cast she is still having a lot of drainage probably too much drainage from the right foot wound will week with the same cast. She will be back on Thursday. The area on the left looks somewhat better 3/11; patient here for a total contact cast change. 3/15; patient came in for wound care evaluation. She is still on IV vancomycin and oral Flagyl for osteomyelitis in the left foot. The area on the right foot we are putting in a total contact cast. 3/18 for total contact cast change on the right foot 3/22; the patient was here for wound review today. The area on the right foot is slightly smaller in terms of surface area. However the surface of the wound is very gritty and hyper granulated. More problematically the area on the plantar left foot has increased depth indeed in the [name] of this it probes to bone. We have been using Hydrofera Blue in both areas. She is on antibiotics as  prescribed by infectious disease IV vancomycin and oral Flagyl 3/25; the patient is in for total contact cast change. Our intake nurse was concerned about the amount of drainage which soaked right to the multiple layers we are putting on this. Wondered about options. The wound bed actually looks as healthy as this is looked although there may not be a lot of change in dimensions things are looking a lot better. If we are going to heal this woman's wound which is in the middle of her right Charcot foot this is going to need to be offloaded. There are not many alternatives 3/29; still not a lot of improvement although the surface of the right wound looks better. We have been using Hydrofera Blue 04/17/2019 upon evaluation today patient appears to be doing well with a total contact cast. With actually having to change this twice a week. She saw [name] for wound care earlier in the week this is for the cast change today. That is  due to the fact that she has significant drainage. Overall the wound is been looking excellent however. 4/5; we continue to put a total contact cast on this patient with Hydrofera Blue. Still a lot of drainage which precludes a simple weekly change or changing this twice a week. 4/8; total contact cast changed today. Apparently the wound looks better per our intake nurse 4/12; patient here for wound care evaluation. Wounds on the right foot have not changed in dimension none about a month left foot looks similar. Apparently her antibiotics that we are giving her for osteomyelitis in the left foot complete on Wednesday. We have been using Hydrofera Blue on the right foot under a total contact cast. Nurses report greenish drainage although I think this may be discoloration from the [facility] 4/15; back for cast change wounds look quite good although still moderate amount of drainage. T contact cast reapplied otal 4/19;. Wound evaluation. Wound on the right foot is smaller  surface looks healthy. There is still the divot in the middle part of this wound but I could not feel any bone. Area on the left foot also looks better She has completed her antibiotics but still has the PICC line in. 4/23; patient comes back for a total contact cast change this was done in the standard fashion no issues 4/26; wound measures slightly larger on the right foot which is disappointing. She sees Dr. Orvan Falconer with regards to her antibiotics on Wednesday. I believe she is on vancomycin IV and oral Flagyl. 4/29; in for her routine total contact cast change. She saw Dr. Orvan Falconer this morning. He is asking about antibiotic continuation I will be in touch with him. I did not look at her wound today 5/3; patient saw Dr. Orvan Falconer on 4/28. I did send him a note asking about the continuation of perhaps an oral regimen. I have not heard back. She still has an elevated sedimentation rate at 81. Her wounds are smaller. 5/6; in for her total contact cast change. We did not look at the wound today. She has seen Dr. Orvan Falconer and is now on oral Keflex and Flagyl. She seems to be tolerating these well 5/10; she had her PICC line removed. The wound on the right foot after some initial improvement has not made any improvement even with the total contact cast. Still having a lot of drainage. Likewise the area on the left foot really is not any better either 5/14; in for a cast change today. We will look at the right foot on Monday. If this is not making any progress I am going to try to cast the left foot. 5/17; the wound on the right foot is absolutely no different. I am going to put ointment on this area and change the cast of the left foot. We have been using silver alginate there 5/21; apparently the area on the right foot was better per our intake nurse although I did not see it. We did a standard total contact cast change on the left foot we have been applying silver alginate to the wound  here. 06/10/19-Area on the right foot per dimensions slightly better, the left foot has still some depth applying silver alginate to the wound with TCC 06/13/19-Patient here for Casting to Left foot 6/1; patient had the total contact cast were placed on the left foot. Noted some swelling in her right leg although the dimensions of the wound on the right foot are better. We have been using PolyMem on  the right and silver alginate on the left under the cast 6/7; we continue to make nice progress in both her plantar feet wound. We are putting the total contact cast on the left. Surprisingly in spite of this the area on the right medial foot in the middle of her Charcot deformities actually is making progress as well 07/01/2019 upon evaluation today patient actually appears to be doing quite well with regard to her wounds on the feet compared to when I saw her last that has been quite some time. With that being said I do believe the total contact cast has been beneficial in this left foot and she has a lot of signs of improvement she does have some callus overhanging which is caused a little bit of a ledge and an issue here. I do believe that we may be able to do something to help in that regard. Fortunately otherwise the wound seems to be progressing quite nicely. 6/21; the patient's area on the left foot that we are currently casting is just about closed. The area on the right medial foot has a healthy looking surface but may be not changed as much from last week in terms of surface area. We are using silver alginate on the right and polymen Ag under the cast 6/28; only a small open area on the left foot remains. Using silver alginate under a total contact cast. She has a diabetic shoe I have asked her to bring that for next week where this may be closed. The area on the right foot with a Charcot deformity is also measuring slightly smaller. We are using PolyMem Ag here 7/6; left foot is totally closed.  She does not require a total contact cast. She will graduate to her own diabetic shoe. The area on the right foot with a Charcot deformity measuring about the same. We have been using polymen under compression wraps. She did not do well in this area with a total contact cast 7/13; left foot remains intact. She is skillfully covering this area with foam and using her diabetic shoe. Slight area improvement of the right foot wound. Still rolled senescent looking edges. We have been using polymen 7/19; left foot remains intact. Wound measuring slightly smaller on the right foot in the middle of the Charcot deformity. We have been using PolyMem Ag Electronic Signature(s) Signed: 08/06/2019 6:00:01 PM By: Baltazar Najjar MD Entered By: Baltazar Najjar on 08/05/2019 14:13:34 -------------------------------------------------------------------------------- Physical Exam Details Patient Name: Date of Service: [name], [name] 08/05/2019 1:30 PM Medical Record Number: [id number] Patient Account Number: [id number] Date of Birth/Sex: Treating RN: 18-May-1955 (64 y.o. [name] Primary Care Provider: MA SO UDI, [place] MA LSA DA T Other Clinician: Referring Provider: Treating Provider/Extender: Baltazar Najjar MA SO UDI, ELHA MA LSA DA T Weeks in Treatment: 27 Notes Wound exam Left foot totally closed. Right foot dimensions slightly smaller. Rolled senescent edges removed with a #5 curette removing skin and subcutaneous tissue from the wound margins. Hemostasis with direct pressure Electronic Signature(s) Signed: 08/06/2019 6:00:01 PM By: Baltazar Najjar MD Entered By: Baltazar Najjar on 08/05/2019 14:14:04 -------------------------------------------------------------------------------- Physician Orders Details Patient Name: Date of Service: [name], [name], [name] 08/05/2019 1:30 PM Medical Record Number: [id number] Patient Account Number: [id number] Date of Birth/Sex: Treating RN: 11-29-1955 (64  y.o. [name] Primary Care Provider: MA SO UDI, Judene Companion MA LSA DA T Other Clinician: Referring Provider: Treating Provider/Extender: Baltazar Najjar MA SO UDI, ELHA MA LSA DA T Weeks in  Treatment: 27 Verbal / Phone Orders: No Diagnosis Coding ICD-10 Coding Code Description E11.621 Type 2 diabetes mellitus with foot ulcer L97.524 Non-pressure chronic ulcer of other part of left foot with necrosis of bone L97.511 Non-pressure chronic ulcer of other part of right foot limited to breakdown of skin L97.514 Non-pressure chronic ulcer of other part of right foot with necrosis of bone E11.42 Type 2 diabetes mellitus with diabetic polyneuropathy M14.671 Charcot's joint, right ankle and foot M14.672 Charcot's joint, left ankle and foot 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 Dressing Change Frequency Wound #9 Right,Medial Foot Do not change entire dressing for one week. Wound Cleansing Wound #9 Right,Medial Foot May shower with protection. - use cast protector Primary Wound Dressing Wound #9 Right,Medial Foot Polymem Silver Secondary Dressing Wound #9 Right,Medial Foot Foam - foam donut Dry Gauze ABD pad Zetuvit or Kerramax Edema Control 3 Layer Compression System - Right Lower Extremity Avoid standing for long periods of time Elevate legs to the level of the heart or above for 30 minutes daily and/or when sitting, a frequency of: - throughout the day Exercise regularly Off-Loading Open toe surgical shoe to: - right foot Electronic Signature(s) Signed: 08/05/2019 4:12:59 PM By: Zandra Abts RN, BSN Signed: 08/06/2019 6:00:01 PM By: Baltazar Najjar MD Entered By: Zandra Abts on 08/05/2019 14:07:20 -------------------------------------------------------------------------------- Problem List Details Patient Name: Date of Service: [name], [name] 08/05/2019 1:30 PM Medical Record Number:  [id number] Patient Account Number: [id number] Date of Birth/Sex: Treating RN: 09-06-1955 (64 y.o. [name] Primary Care Provider: MA SO UDI, Judene Companion MA LSA DA T Other Clinician: Referring Provider: Treating Provider/Extender: Abe People, ELHA MA LSA DA T Weeks in Treatment: 27 Active Problems ICD-10 Encounter Code Description Active Date MDM Diagnosis E11.621 Type 2 diabetes mellitus with foot ulcer 01/28/2019 No Yes L97.524 Non-pressure chronic ulcer of other part of left foot with necrosis of bone 01/28/2019 No Yes L97.511 Non-pressure chronic ulcer of other part of right foot limited to breakdown of 01/28/2019 No Yes skin L97.514 Non-pressure chronic ulcer of other part of right foot with necrosis of bone 01/28/2019 No Yes E11.42 Type 2 diabetes mellitus with diabetic polyneuropathy 01/28/2019 No Yes M14.671 Charcot's joint, right ankle and foot 01/28/2019 No Yes M14.672 Charcot's joint, left ankle and foot 01/28/2019 No Yes 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 08/05/2019 1:30 PM Medical Record Number: [id number] Patient Account Number: [id number] Date of Birth/Sex: Treating RN: 18-Apr-1955 (64 y.o. [name] Primary Care Provider: Other Clinician: MA SO UDI, [place] LSA DA T Referring Provider: Treating Provider/Extender: Baltazar Najjar MA SO UDI, ELHA MA LSA DA T Weeks in Treatment: 27 Subjective History of Present Illness (HPI) 64 yrs old bf here for follow up recurrent left charcot foot ulcer. She has DM. She is not a smoker. She states a blister developed 3 months ago at site of previous breakdown  from 1 year ago. It was debrided at the podiatrist's office. She went to the ER and then sent here. She denies pain. Xray was negative for osteo. Culture from this clinic negative. 09/08/14 Referred by [name] to [name] for Charcot foot. Seen by him and MRI scheduled 09/19/14. Prior silver collagen but this  was not ordered last visit, patient has been rinsing with saline alone. Re institute silver collagen every other day. F/u 2 weeks with [name] as Labor Day holiday. Supplies ordered. 09/25/14; this is a patient with a Charcot foot on the left. she has a wound over the plantar aspect of her left calcaneus. She has been seen by [name] of orthopedic surgery and has had an MRI. She is apparently going within the next week or 2 for corrective surgery which will involve an external fixator. I don't have any of the details of this. 10/06/14; The patient is a 64 yrs old bf with a left Charcot foot and ulcer on the plantar. 12/01/14 Returns post surgery with [name]. Has follow up visit later this week with him, currently in facility and NWB over this extremity. States no drainage from foot and no current wound care. On exam callus and scab in place, suspect healed. Has external fixator in place. Will defer to [name] for management, ok to place moisturizing cream over scabs and callus and she will call if she requires follow up here. READMISSION 01/28/2019 Patient was in the clinic in 2016 for a wound on the plantar aspect of her left calcaneus. Predominantly cared for by [name] she was referred to [name] and had corrective surgery for the position of her left ankle I believe. She had previously been here in 2015 and then prior to that in 2011 2012 that I do not have these records. More recently she has developed fairly extensive wounds on the right medial foot, left lateral foot and a small area on the right medial great toe. Right medial foot wound has been there for 6 to 7  months, left foot wound for 2 to 3 months and the right great toe only over the last few weeks. She has been followed by [name] at Dr. Laverta [place] office it sounds as though she has been undergoing callus paring and silver nitrate. The patient has been washing these off with soap and water and plan applying dry dressing Past medical history, type 2 diabetes well controlled with a recent hemoglobin A1c of 7 on 11/16, type 2 diabetes with peripheral neuropathy, previous left Charcot joint surgery bilateral Charcot joints currently, stage III chronic renal failure, hypertension, obstructive sleep apnea, history of MRSA and gastroesophageal reflux. 1/18; this is a patient with bilateral Charcot feet. Plain x-rays that I did of both of these wounds that are either at bone or precariously close to bone were done. On the right foot this showed possible lytic destruction involving the anterior aspect of the talus which may represent a wound osteomyelitis. An MRI was recommended. On the left there was no definite lytic destruction although the wound is deep undermining's and I think probably requires an MRI on this basis in any case 1/25; patient was supposed to have her MRI last Friday of her bilateral feet however they would not do it because there was silver alginate in her dressings, will replace with calcium alginate today and will see if we can get her done today 2/1; patient did not get her MRIs done last week because of issues with the MRI department receiving orders to do bilateral feet. She has 2 deep wounds in the setting of Charcot feet deformities bilaterally. Both of them at one point had exposed bone although I had trouble demonstrating that today. I am not sure that we can offload these very aggressively as I watched her leave  the clinic last week her gait is very narrow and unsteady. 2/15; the MRI of her right foot did not show osteomyelitis potentially osteonecrosis of the talus. However  on the left foot there was suggestive findings of osteomyelitis in the calcaneus. The wounds are a large wound on the right medial foot and on the left a substantial wound on the left lateral plantar calcaneus. We have been using silver alginate. Ultimately I think we need to consider a total contact cast on the right while we work through the possibility of osteomyelitis in the left. I watched her walk out with her crutches I have some trepidation about the ability to walk with the cast on her right foot. Nevertheless with her Charcot deformities I do not think there is a way to heal these short of offloading them aggressively. 2/22; the culture of the bone in the left foot that I did last week showed a few Citrobacter Koseri as well as some streptococcal species. In my attempted bone for pathology did not actually yield a lot of bone the pathologist did not identify any osteomyelitis. 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 Is promised today and aggressive debridement of the right foot and a total contact cast which surprisingly she seemed to tolerate quite well 03/13/2019 upon evaluation today patient appears to be doing well with her total contact cast she is here for the first cast change. She had no areas of rubbing or any other complications at this point. Overall things are doing quite well. 2/26; the patient was kindly seen by Dr. Orvan Falconer of infectious disease on 2/25. She is now on IV vancomycin PICC line placement in 2 days and oral Flagyl. This was in response to her MRI showing osteomyelitis of the left calcaneus concerning for osteomyelitis. We have been putting her right leg in a total contact cast. Our nurses report drainage. She is tolerating the total contact cast well. 3/4; the patient is started her IV antibiotics and is taking IV  vancomycin and oral Flagyl. She appears to be tolerating these both well. This is for osteomyelitis involving the left calcaneus. The area on the right in the setting of bilateral Charcot deformities did not have any bone infection on MRI. We put a total contact cast on her with silver alginate as the primary dressing and she returns today in follow-up. Per our intake nurse there was too much drainage to leave this on all week therefore we will bring her back for an early change 3/8; follow-up total contact cast she is still having a lot of drainage probably too much drainage from the right foot wound will week with the same cast. She will be back on Thursday. The area on the left looks somewhat better 3/11; patient here for a total contact cast change. 3/15; patient came in for wound care evaluation. She is still on IV vancomycin and oral Flagyl for osteomyelitis in the left foot. The area on the right foot we are putting in a total contact cast. 3/18 for total contact cast change on the right foot 3/22; the patient was here for wound review today. The area on the right foot is slightly smaller in terms of surface area. However the surface of the wound is very gritty and hyper granulated. More problematically the area on the plantar left foot has increased  depth indeed in the [name] of this it probes to bone. We have been using Hydrofera Blue in both areas. She is on antibiotics as prescribed by infectious disease IV vancomycin and oral Flagyl 3/25; the patient is in for total contact cast change. Our intake nurse was concerned about the amount of drainage which soaked right to the multiple layers we are putting on this. Wondered about options. The wound bed actually looks as healthy as this is looked although there may not be a lot of change in dimensions things are looking a lot better. If we are going to heal this woman's wound which is in the middle of her right Charcot foot this is going to  need to be offloaded. There are not many alternatives 3/29; still not a lot of improvement although the surface of the right wound looks better. We have been using Hydrofera Blue 04/17/2019 upon evaluation today patient appears to be doing well with a total contact cast. With actually having to change this twice a week. She saw [name] for wound care earlier in the week this is for the cast change today. That is due to the fact that she has significant drainage. Overall the wound is been looking excellent however. 4/5; we continue to put a total contact cast on this patient with Hydrofera Blue. Still a lot of drainage which precludes a simple weekly change or changing this twice a week. 4/8; total contact cast changed today. Apparently the wound looks better per our intake nurse 4/12; patient here for wound care evaluation. Wounds on the right foot have not changed in dimension none about a month left foot looks similar. Apparently her antibiotics that we are giving her for osteomyelitis in the left foot complete on Wednesday. We have been using Hydrofera Blue on the right foot under a total contact cast. Nurses report greenish drainage although I think this may be discoloration from the [name] [name] 4/15; back for cast change wounds look quite good although still moderate amount of drainage. T contact cast reapplied otal 4/19;. Wound evaluation. Wound on the right foot is smaller surface looks healthy. There is still the divot in the middle part of this wound but I could not feel any bone. Area on the left foot also looks better She has completed her antibiotics but still has the PICC line in. 4/23; patient comes back for a total contact cast change this was done in the standard fashion no issues 4/26; wound measures slightly larger on the right foot which is disappointing. She sees Dr. Orvan Falconer with regards to her antibiotics on Wednesday. I believe she is on vancomycin IV and oral  Flagyl. 4/29; in for her routine total contact cast change. She saw Dr. Orvan Falconer this morning. He is asking about antibiotic continuation I will be in touch with him. I did not look at her wound today 5/3; patient saw Dr. Orvan Falconer on 4/28. I did send him a note asking about the continuation of perhaps an oral regimen. I have not heard back. She still has an elevated sedimentation rate at 81. Her wounds are smaller. 5/6; in for her total contact cast change. We did not look at the wound today. She has seen Dr. Orvan Falconer and is now on oral Keflex and Flagyl. She seems to be tolerating these well 5/10; she had her PICC line removed. The wound on the right foot after some initial improvement has not made any improvement even with the total contact cast. Still having a  lot of drainage. Likewise the area on the left foot really is not any better either 5/14; in for a cast change today. We will look at the right foot on Monday. If this is not making any progress I am going to try to cast the left foot. 5/17; the wound on the right foot is absolutely no different. I am going to put ointment on this area and change the cast of the left foot. We have been using silver alginate there 5/21; apparently the area on the right foot was better per our intake nurse although I did not see it. We did a standard total contact cast change on the left foot we have been applying silver alginate to the wound here. 06/10/19-Area on the right foot per dimensions slightly better, the left foot has still some depth applying silver alginate to the wound with TCC 06/13/19-Patient here for Casting to Left foot 6/1; patient had the total contact cast were placed on the left foot. Noted some swelling in her right leg although the dimensions of the wound on the right foot are better. We have been using PolyMem on the right and silver alginate on the left under the cast 6/7; we continue to make nice progress in both her plantar feet  wound. We are putting the total contact cast on the left. Surprisingly in spite of this the area on the right medial foot in the middle of her Charcot deformities actually is making progress as well 07/01/2019 upon evaluation today patient actually appears to be doing quite well with regard to her wounds on the feet compared to when I saw her last that has been quite some time. With that being said I do believe the total contact cast has been beneficial in this left foot and she has a lot of signs of improvement she does have some callus overhanging which is caused a little bit of a ledge and an issue here. I do believe that we may be able to do something to help in that regard. Fortunately otherwise the wound seems to be progressing quite nicely. 6/21; the patient's area on the left foot that we are currently casting is just about closed. The area on the right medial foot has a healthy looking surface but may be not changed as much from last week in terms of surface area. We are using silver alginate on the right and polymen Ag under the cast 6/28; only a small open area on the left foot remains. Using silver alginate under a total contact cast. She has a diabetic shoe I have asked her to bring that for next week where this may be closed. The area on the right foot with a Charcot deformity is also measuring slightly smaller. We are using PolyMem Ag here 7/6; left foot is totally closed. She does not require a total contact cast. She will graduate to her own diabetic shoe. ooThe area on the right foot with a Charcot deformity measuring about the same. We have been using polymen under compression wraps. She did not do well in this area with a total contact cast 7/13; left foot remains intact. She is skillfully covering this area with foam and using her diabetic shoe. Slight area improvement of the right foot wound. Still rolled senescent looking edges. We have been using polymen 7/19; left foot remains  intact. Wound measuring slightly smaller on the right foot in the middle of the Charcot deformity. We have been using PolyMem Ag Objective Constitutional  Vitals Time Taken: 1:46 PM, Height: 68 in, Weight: 265 lbs, BMI: 40.3, Temperature: 98.4 F, Pulse: 72 bpm, Respiratory Rate: 14 breaths/min, Blood Pressure: 117/62 mmHg. Integumentary (Hair, Skin) Wound #9 status is Open. Original cause of wound was Blister. The wound is located on the Right,Medial Foot. The wound measures 2.1cm length x 1.9cm width x 0.3cm depth; 3.134cm^2 area and 0.94cm^3 volume. There is Fat Layer (Subcutaneous Tissue) Exposed exposed. There is no tunneling or undermining noted. There is a medium amount of serosanguineous drainage noted. The wound margin is well defined and not attached to the wound base. There is large (67- 100%) red, pink granulation within the wound bed. There is no necrotic tissue within the wound bed. 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 There was a Excisional Skin/Subcutaneous Tissue Debridement with a total area of 3.99 sq cm performed by [name], MD. With the following instrument(s): Curette to remove Viable and Non-Viable tissue/material. Material removed includes Subcutaneous Tissue and Slough and. No specimens were taken. A time out was  conducted at 14:08, prior to the start of the procedure. A Minimum amount of bleeding was controlled with Pressure. The procedure was tolerated well with a pain level of 0 throughout and a pain level of 0 following the procedure. Post Debridement Measurements: 2.1cm length x 1.9cm width x 0.3cm depth; 0.94cm^3 volume. Character of Wound/Ulcer Post Debridement is improved. Severity of Tissue Post Debridement is: Fat layer exposed. Post procedure Diagnosis Wound #9: Same as Pre-Procedure Pre-procedure diagnosis of Wound #9 is a Diabetic Wound/Ulcer of the Lower Extremity located on the Right,Medial Foot . There was a Three Layer Compression Therapy Procedure by Zandra Abts, RN. Post procedure Diagnosis Wound #9: Same as Pre-Procedure Plan Follow-up Appointments: Return Appointment in 1 week. Dressing Change Frequency: Wound #9 Right,Medial Foot: Do not change entire dressing for one week. Wound Cleansing: Wound #9 Right,Medial Foot: May shower with protection. - use cast protector Primary Wound Dressing: Wound #9 Right,Medial Foot: Polymem Silver Secondary Dressing: Wound #9 Right,Medial Foot: Foam - foam donut Dry Gauze ABD pad Zetuvit or Kerramax Edema Control: 3 Layer Compression System - Right Lower Extremity Avoid standing for long periods of time Elevate legs to the level of the heart or above for 30 minutes daily and/or when sitting, a frequency of: - throughout the day Exercise regularly Off-Loading: Open toe surgical shoe to: - right foot Continue with PolyMem silver/foam/ABD 2. I have asked her to offload this as much as she can with her crutches Electronic Signature(s) Signed: 08/06/2019 6:00:01 PM By: Baltazar Najjar MD Entered By: Baltazar Najjar on 08/05/2019 14:14:41 -------------------------------------------------------------------------------- SuperBill Details Patient Name: Date of Service: [name], [name] 08/05/2019 Medical Record Number:  [id number] Patient Account Number: [id number] Date of Birth/Sex: Treating RN: December 03, 1955 (64 y.o. [name] Primary Care Provider: MA Hedy Jacob MA LSA DA T Other Clinician: Referring Provider: Treating Provider/Extender: Baltazar Najjar MA  SO UDI, ELHA MA LSA DA T Weeks in Treatment: 27 Diagnosis Coding ICD-10 Codes Code Description E11.621 Type 2 diabetes mellitus with foot ulcer L97.524 Non-pressure chronic ulcer of other part of left foot with necrosis of bone L97.511 Non-pressure chronic ulcer of other part of right foot limited to breakdown of skin L97.514 Non-pressure chronic ulcer of other part of right foot with necrosis of bone E11.42 Type 2 diabetes mellitus with diabetic polyneuropathy M14.671 Charcot's joint, right ankle and foot M14.672 Charcot's joint, left ankle and foot 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, left ankle and foot Facility Procedures CPT4 Code: [id number] Description: 11042 - DEB SUBQ TISSUE 20 SQ CM/< ICD-10 Diagnosis Description L97.511 Non-pressure chronic ulcer of other part of right foot limited to breakdown of Modifier: skin Quantity: 1 Physician Procedures : CPT4 Code Description Modifier [id number] 11042 - WC PHYS SUBQ TISS 20 SQ CM ICD-10 Diagnosis Description L97.511 Non-pressure chronic ulcer of other part of right foot limited to breakdown of skin Quantity: 1 Electronic Signature(s) Signed: 08/06/2019 6:00:01 PM By: Baltazar Najjar MD Entered By: Baltazar Najjar on 08/05/2019 14:14:58

## 2019-08-12 ENCOUNTER — Encounter (HOSPITAL_BASED_OUTPATIENT_CLINIC_OR_DEPARTMENT_OTHER): Payer: Medicare Other | Admitting: Internal Medicine

## 2019-08-12 DIAGNOSIS — E11621 Type 2 diabetes mellitus with foot ulcer: Secondary | ICD-10-CM | POA: Diagnosis not present

## 2019-08-14 NOTE — Progress Notes (Signed)
[name], [name] ([id number]) Visit Report for 08/12/2019 Debridement Details Patient Name: Date of Service: [name], [name] 08/12/2019 1:30 PM Medical Record Number: [id number] Patient Account Number: [id number] Date of Birth/Sex: Treating RN: 1955-10-18 (64 y.o. [name] Primary Care Provider: MA SO [name], [name] Other Clinician: Referring Provider: Treating Provider/Extender: [name] [name], ELHA Sarah LSA DA [name] Weeks in Treatment: 28 Debridement Performed for Assessment: Wound #9 Right,Medial Foot Performed By: Physician [name] Debridement Type: Debridement Severity of Tissue Pre Debridement: Fat layer exposed Level of Consciousness (Pre-procedure): Awake and Alert Pre-procedure Verification/Time Out Yes - 14:40 Taken: Start Time: 14:40 [name] Area Debrided (L x W): otal 2.3 (cm) x 1.7 (cm) = 3.91 (cm) Tissue and other material debrided: Viable, Non-Viable, Callus, Subcutaneous Level: Skin/Subcutaneous Tissue Debridement Description: Excisional Instrument: Curette Bleeding: Moderate Hemostasis Achieved: Pressure End Time: 14:41 Procedural Pain: 0 Post Procedural Pain: 0 Response to Treatment: Procedure was tolerated well Level of Consciousness (Post- Awake and Alert procedure): Post Debridement Measurements of Total Wound Length: (cm) 2.3 Width: (cm) 1.7 Depth: (cm) 0.6 Volume: (cm) 1.843 Character of Wound/Ulcer Post Debridement: Improved Severity of Tissue Post Debridement: Fat layer exposed Post Procedure Diagnosis Same as Pre-procedure Electronic Signature(s) Signed: 08/12/2019 6:00:25 PM By: Sarah Najjar [name] Signed: 08/14/2019 6:13:34 PM By: Sarah Abts RN, BSN Entered By: [name] 14:41:41 -------------------------------------------------------------------------------- HPI Details Patient Name: Date of Service: [name], [name] 08/12/2019 1:30 PM Medical Record Number: [id number] Patient  Account Number: [id number] Date of Birth/Sex: Treating RN: 12/10/1955 (64 y.o. [name] Primary Care Provider: MA SO [name], [place] Sarah LSA DA [name] Other Clinician: Referring Provider: Treating Provider/Extender: [name] [name], ELHA Sarah LSA DA [name] Weeks in Treatment: 28 History of Present Illness HPI Description: 64 yrs old bf here for follow up recurrent left charcot foot ulcer. She has DM. She is not a smoker. She states a blister developed 3 months ago at site of previous breakdown from 1 year ago. It was debrided at the podiatrist's office. She went to the ER and then sent here. She denies pain. Xray was negative for osteo. Culture from this clinic negative. 09/08/14 [address]erred by Dr. Kelly [name] to Dr. Victorino [name] for Charcot foot. Seen by him and MRI scheduled 09/19/14. Prior silver collagen but this was not ordered last visit, patient has been rinsing with saline alone. Re institute silver collagen every other day. F/u 2 weeks with Dr. Leanord [name] as Labor Day holiday. Supplies ordered. 09/25/14; this is a patient with a Charcot foot on the left. she has a wound over the plantar aspect of her left calcaneus. She has been seen by Dr. Victorino [name] of orthopedic surgery and has had an MRI. She is apparently going within the next week or 2 for corrective surgery which will involve an external fixator. I don'[name] have any of the details of this. 10/06/14; The patient is a 64 yrs old bf with a left Charcot foot and ulcer on the plantar. 12/01/14 Returns post surgery with Dr. Victorino [name]. Has follow up visit later this week with him, currently in facility and NWB over this extremity. States no drainage from foot and no current wound care. On exam callus and scab in place, suspect healed. Has external fixator in place. Will defer to Dr. Victorino [name] for management, ok to place moisturizing cream over scabs and callus and she will call if she requires follow up here. READMISSION 01/28/2019 Patient was in the  clinic in 2016 for a  wound on the plantar aspect of her left calcaneus. Predominantly cared for by [name] she was referred to Dr. Victorino [name] and had corrective surgery for the position of her left ankle I believe. She had previously been here in 2015 and then prior to that in 2011 2012 that I do not have these records. More recently she has developed fairly extensive wounds on the right medial foot, left lateral foot and a small area on the right medial great toe. Right medial foot wound has been there for 6 to 7 months, left foot wound for 2 to 3 months and the right great toe only over the last few weeks. She has been followed by [name] at [name] office it sounds as though she has been undergoing callus paring and silver nitrate. The patient has been washing these off with soap and water and plan applying dry dressing Past medical history, type 2 diabetes well controlled with a recent hemoglobin A1c of 7 on 11/16, type 2 diabetes with peripheral neuropathy, previous left Charcot joint surgery bilateral Charcot joints currently, stage III chronic renal failure, hypertension, obstructive sleep apnea, history of MRSA and gastroesophageal reflux. 1/18; this is a patient with bilateral Charcot feet. Plain x-rays that I did of both of these wounds that are either at bone or precariously close to bone were done. On the right foot this showed possible lytic destruction involving the anterior aspect of the talus which may represent a wound osteomyelitis. An MRI was recommended. On the left there was no definite lytic destruction although the wound is deep undermining's and I think probably requires an MRI on this basis in any case 1/25; patient was supposed to have her MRI last Friday of her bilateral feet however they would not do it because there was silver alginate in her dressings, will replace with calcium alginate today and will see if we can get her done today 2/1; patient did not get her MRIs done last  week because of issues with the MRI department receiving orders to do bilateral feet. She has 2 deep wounds in the setting of Charcot feet deformities bilaterally. Both of them at one point had exposed bone although I had trouble demonstrating that today. I am not sure that we can offload these very aggressively as I watched her leave the clinic last week her gait is very narrow and unsteady. 2/15; the MRI of her right foot did not show osteomyelitis potentially osteonecrosis of the talus. 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 The wounds are a large wound on the right medial foot and on the left a substantial wound on the left lateral plantar calcaneus. We have been using silver alginate. Ultimately I think we need to consider a total contact cast on the right while we work through the possibility of osteomyelitis in the left. I watched her walk out with her crutches I have some trepidation about the ability to walk with the cast on her right foot. Nevertheless with her Charcot deformities I do not think there is a way to heal these short of offloading them aggressively. 2/22; the culture of the bone in the left foot that I did last week showed a few Citrobacter Koseri as well as some streptococcal species. In my attempted bone for pathology did not actually yield a lot of bone the pathologist did not identify any osteomyelitis. Nevertheless based on the MRI, bone for culture  and the probing wound into the bone itself I think this woman has osteomyelitis in the left foot and we will refer her to infectious disease. Is promised today and aggressive debridement of the right foot and a total contact cast which surprisingly she seemed to tolerate quite well 03/13/2019 upon evaluation today patient appears to be doing well with her total contact cast she is here for the first cast change. She had no areas of rubbing or any other complications at this  point. Overall things are doing quite well. 2/26; the patient was kindly seen by Dr. Megan Salon of infectious disease on 2/25. She is now on IV vancomycin PICC line placement in 2 days and oral Flagyl. This was in response to her MRI showing osteomyelitis of the left calcaneus concerning for osteomyelitis. We have been putting her right leg in a total contact cast. Our nurses report drainage. She is tolerating the total contact cast well. 3/4; the patient is started her IV antibiotics and is taking IV vancomycin and oral Flagyl. She appears to be tolerating these both well. This is for osteomyelitis involving the left calcaneus. The area on the right in the setting of bilateral Charcot deformities did not have any bone infection on MRI. We put a total contact cast on her with silver alginate as the primary dressing and she returns today in follow-up. Per our intake nurse there was too much drainage to leave this on all week therefore we will bring her back for an early change 3/8; follow-up total contact cast she is still having a lot of drainage probably too much drainage from the right foot wound will week with the same cast. She will be back on Thursday. The area on the left looks somewhat better 3/11; patient here for a total contact cast change. 3/15; patient came in for wound care evaluation. She is still on IV vancomycin and oral Flagyl for osteomyelitis in the left foot. The area on the right foot we are putting in a total contact cast. 3/18 for total contact cast change on the right foot 3/22; the patient was here for wound review today. The area on the right foot is slightly smaller in terms of surface area. However the surface of the wound is very gritty and hyper granulated. More problematically the area on the plantar left foot has increased depth indeed in the center of this it probes to bone. We have been using Hydrofera Blue in both areas. She is on antibiotics as prescribed by  infectious disease IV vancomycin and oral Flagyl 3/25; the patient is in for total contact cast change. Our intake nurse was concerned about the amount of drainage which soaked right to the multiple layers we are putting on this. Wondered about options. The wound bed actually looks as healthy as this is looked although there may not be a lot of change in dimensions things are looking a lot better. If we are going to heal this woman's wound which is in the middle of her right Charcot foot this is going to need to be offloaded. There are not many alternatives 3/29; still not a lot of improvement although the surface of the right wound looks better. We have been using Hydrofera Blue 04/17/2019 upon evaluation today patient appears to be doing well with a total contact cast. With actually having to change this twice a week. She saw Dr. Dellia Nims for wound care earlier in the week this is for the cast change today. That is due  to the fact that she has significant drainage. Overall the wound is been looking excellent however. 4/5; we continue to put a total contact cast on this patient with Hydrofera Blue. Still a lot of drainage which precludes a simple weekly change or changing this twice a week. 4/8; total contact cast changed today. Apparently the wound looks better per our intake nurse 4/12; patient here for wound care evaluation. Wounds on the right foot have not changed in dimension none about a month left foot looks similar. Apparently her antibiotics that we are giving her for osteomyelitis in the left foot complete on Wednesday. We have been using Hydrofera Blue on the right foot under a total contact cast. Nurses report greenish drainage although I think this may be discoloration from the [facility] 4/15; back for cast change wounds look quite good although still moderate amount of drainage. [name] contact cast reapplied otal 4/19;. Wound evaluation. Wound on the right foot is smaller surface looks  healthy. There is still the divot in the middle part of this wound but I could not feel any bone. Area on the left foot also looks better She has completed her antibiotics but still has the PICC line in. 4/23; patient comes back for a total contact cast change this was done in the standard fashion no issues 4/26; wound measures slightly larger on the right foot which is disappointing. She sees Dr. Orvan Falconer with regards to her antibiotics on Wednesday. I believe she is on vancomycin IV and oral Flagyl. 4/29; in for her routine total contact cast change. She saw Dr. Orvan Falconer this morning. He is asking about antibiotic continuation I will be in touch with him. I did not look at her wound today 5/3; patient saw Dr. Orvan Falconer on 4/28. I did send him a note asking about the continuation of perhaps an oral regimen. I have not heard back. She still has an elevated sedimentation rate at 81. Her wounds are smaller. 5/6; in for her total contact cast change. We did not look at the wound today. She has seen Dr. Orvan Falconer and is now on oral Keflex and Flagyl. She seems to be tolerating these well 5/10; she had her PICC line removed. The wound on the right foot after some initial improvement has not made any improvement even with the total contact cast. Still having a lot of drainage. Likewise the area on the left foot really is not any better either 5/14; in for a cast change today. We will look at the right foot on Monday. If this is not making any progress I am going to try to cast the left foot. 5/17; the wound on the right foot is absolutely no different. I am going to put ointment on this area and change the cast of the left foot. We have been using silver alginate there 5/21; apparently the area on the right foot was better per our intake nurse although I did not see it. We did a standard total contact cast change on the left foot we have been applying silver alginate to the wound here. 06/10/19-Area on the  right foot per dimensions slightly better, the left foot has still some depth applying silver alginate to the wound with TCC 06/13/19-Patient here for Casting to Left foot 6/1; patient had the total contact cast were placed on the left foot. Noted some swelling in her right leg although the dimensions of the wound on the right foot are better. We have been using PolyMem on the  right and silver alginate on the left under the cast 6/7; we continue to make nice progress in both her plantar feet wound. We are putting the total contact cast on the left. Surprisingly in spite of this the area on the right medial foot in the middle of her Charcot deformities actually is making progress as well 07/01/2019 upon evaluation today patient actually appears to be doing quite well with regard to her wounds on the feet compared to when I saw her last that has been quite some time. With that being said I do believe the total contact cast has been beneficial in this left foot and she has a lot of signs of improvement she does have some callus overhanging which is caused a little bit of a ledge and an issue here. I do believe that we may be able to do something to help in that regard. Fortunately otherwise the wound seems to be progressing quite nicely. 6/21; the patient's area on the left foot that we are currently casting is just about closed. The area on the right medial foot has a healthy looking surface but may be not changed as much from last week in terms of surface area. We are using silver alginate on the right and polymen Ag under the cast 6/28; only a small open area on the left foot remains. Using silver alginate under a total contact cast. She has a diabetic shoe I have asked her to bring that for next week where this may be closed. The area on the right foot with a Charcot deformity is also measuring slightly smaller. We are using PolyMem Ag here 7/6; left foot is totally closed. She does not require a total  contact cast. She will graduate to her own diabetic shoe. The area on the right foot with a Charcot deformity measuring about the same. We have been using polymen under compression wraps. She did not do well in this area with a total contact cast 7/13; left foot remains intact. She is skillfully covering this area with foam and using her diabetic shoe. Slight area improvement of the right foot wound. Still rolled senescent looking edges. We have been using polymen 7/19; left foot remains intact. Wound measuring slightly smaller on the right foot in the middle of the Charcot deformity. We have been using PolyMem Ag 7/26; per the patient left foot remains intact. The wound on the right foot looks slightly smaller but dimensions are measuring the same. We have been using polymen Ag Electronic Signature(s) Signed: 08/12/2019 6:00:25 PM By: Sarah Najjar [name] Entered By: [name] on [name] 15:12:19 -------------------------------------------------------------------------------- Physical Exam Details Patient Name: Date of Service: [name], [name] 08/12/2019 1:30 PM Medical Record Number: [id number] Patient Account Number: [id number] Date of Birth/Sex: Treating RN: May 03, 1955 (64 y.o. [name] Primary Care Provider: MA SO [name], [place] Sarah LSA DA [name] Other Clinician: Referring Provider: Treating Provider/Extender: [name] [name], ELHA Sarah LSA DA [name] Weeks in Treatment: 28 Constitutional Sitting or standing Blood Pressure is within target range for patient.. Pulse regular and within target range for patient.Marland Kitchen Respirations regular, non-labored and within target range.. Temperature is normal and within the target range for the patient.Marland Kitchen Appears in no distress. Notes Wound exam Left foot remains closed. Right foot dimensions are about the same. I used a #5 curette to remove callus thick skin and debris on the wound surface this looks quite healthy. No evidence of  infection Electronic Signature(s) Signed: 08/12/2019 6:00:25 PM  By: Sarah Najjar [name] Entered By: [name] on [name] 15:13:03 -------------------------------------------------------------------------------- Physician Orders Details Patient Name: Date of Service: [facility], [name] 08/12/2019 1:30 PM Medical Record Number: [id number] Patient Account Number: [id number] Date of Birth/Sex: Treating RN: [birth year] (64 y.o. [name] Primary Care Provider: MA SO [name], [place] Sarah LSA DA [name] Other Clinician: Referring Provider: Treating Provider/Extender: [name] [name], ELHA Sarah LSA DA [name] Weeks in Treatment: 53 Verbal / Phone Orders: No Diagnosis Coding ICD-10 Coding Code Description E11.621 Type 2 diabetes mellitus with foot ulcer L97.524 Non-pressure chronic ulcer of other part of left foot with necrosis of bone L97.511 Non-pressure chronic ulcer of other part of right foot limited to breakdown of skin L97.514 Non-pressure chronic ulcer of other part of right foot with necrosis of bone E11.42 Type 2 diabetes mellitus with diabetic polyneuropathy M14.671 Charcot's joint, right ankle and foot M14.672 Charcot's joint, left ankle and foot 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 - [name] visit Return A Nurse Visit: - 1 week Dressing Change Frequency Wound #9 Right,Medial Foot Do not change entire dressing for one week. Wound Cleansing Wound #9 Right,Medial Foot May shower with protection. - use cast protector Primary Wound Dressing Wound #9 Right,Medial Foot Polymem Silver Secondary Dressing Wound #9 Right,Medial Foot Foam - foam donut ABD pad Zetuvit or Kerramax Edema Control 3 Layer Compression System - Right Lower Extremity Avoid standing for long periods of time Elevate legs to the level of the heart or above for 30 minutes daily and/or when sitting, a frequency of: - throughout the  day Exercise regularly Off-Loading Open toe surgical shoe to: - right foot Electronic Signature(s) Signed: 08/12/2019 6:00:25 PM By: Sarah Najjar [name] Signed: 08/14/2019 6:13:34 PM By: Sarah Abts RN, BSN Entered By: [name] 14:43:57 -------------------------------------------------------------------------------- Problem List Details Patient Name: Date of Service: [name], Reise J. 08/12/2019 1:30 PM Medical Record Number: [id number] Patient Account Number: [id number] Date of Birth/Sex: Treating RN: 1955-10-04 (64 y.o. [name] Primary Care Provider: MA SO [name], [place] Sarah LSA DA [name] Other Clinician: Referring Provider: Treating Provider/Extender: Abe People, ELHA Sarah LSA DA [name] Weeks in Treatment: 28 Active Problems ICD-10 Encounter Code Description Active Date MDM Diagnosis E11.621 Type 2 diabetes mellitus with foot ulcer 01/28/2019 No Yes L97.511 Non-pressure chronic ulcer of other part of right foot limited to breakdown of 01/28/2019 No Yes skin E11.42 Type 2 diabetes mellitus with diabetic polyneuropathy 01/28/2019 No Yes M14.671 Charcot's joint, right ankle and foot 01/28/2019 No Yes M14.672 Charcot's joint, left ankle and foot 01/28/2019 No Yes 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, left ankle and foot 04/08/2019 No Yes Inactive Problems ICD-10 Code Description Active Date Inactive Date L97.524 Non-pressure chronic ulcer of other part of left foot with necrosis of bone 01/28/2019 01/28/2019 L97.514 Non-pressure chronic ulcer of other part of right foot with necrosis of bone 01/28/2019 01/28/2019 Resolved Problems Electronic Signature(s) Signed: 08/12/2019 6:00:25 PM By: Sarah Najjar [name] Entered By: [name] on [name] 15:11:36 -------------------------------------------------------------------------------- Progress Note Details Patient Name: Date of Service: [name], [name] 08/12/2019 1:30 PM Medical Record Number:  [id number] Patient Account Number: [id number] Date of Birth/Sex: Treating RN: 05/06/1955 (64 y.o. [name] Primary Care Provider: MA SO [name], [place] Companion Sarah LSA DA [name] Other Clinician: Referring Provider: Treating Provider/Extender: [name] [name], ELHA Sarah LSA DA [name] Weeks in Treatment: 28 Subjective History of Present Illness (HPI) 64 yrs old  bf here for follow up recurrent left charcot foot ulcer. She has DM. She is not a smoker. She states a blister developed 3 months ago at site of previous breakdown from 1 year ago. It was debrided at the podiatrist's office. She went to the ER and then sent here. She denies pain. Xray was negative for osteo. Culture from this clinic negative. 09/08/14 [address] Dr. Kelly [name] to Dr. Victorino [name] for Charcot foot. Seen by him and MRI scheduled 09/19/14. Prior silver collagen but this was not ordered last visit, patient has been rinsing with saline alone. Re institute silver collagen every other day. F/u 2 weeks with Dr. Leanord [name] as Labor Day holiday. Supplies ordered. 09/25/14; this is a patient with a Charcot foot on the left. she has a wound over the plantar aspect of her left calcaneus. She has been seen by Dr. Victorino [name] of orthopedic surgery and has had an MRI. She is apparently going within the next week or 2 for corrective surgery which will involve an external fixator. I don'[name] have any of the details of this. 10/06/14; The patient is a 64 yrs old bf with a left Charcot foot and ulcer on the plantar. 12/01/14 Returns post surgery with Dr. Victorino [name]. Has follow up visit later this week with him, currently in facility and NWB over this extremity. States no drainage from foot and no current wound care. On exam callus and scab in place, suspect healed. Has external fixator in place. Will defer to Dr. Victorino [name] for management, ok to place moisturizing cream over scabs and callus and she will call if she requires follow up here. READMISSION 01/28/2019 Patient was in the  clinic in 2016 for a wound on the plantar aspect of her left calcaneus. Predominantly cared for by [name] she was referred to Dr. Victorino [name] and had corrective surgery for the position of her left ankle I believe. She had previously been here in 2015 and then prior to that in 2011 2012 that I do not have these records. More recently she has developed fairly extensive wounds on the right medial foot, left lateral foot and a small area on the right medial great toe. Right medial foot wound has been there for 6 to 7 months, left foot wound for 2 to 3 months and the right great toe only over the last few weeks. She has been followed by [name] at [name] it sounds as though she has been undergoing callus paring and silver nitrate. The patient has been washing these off with soap and water and plan applying dry dressing Past medical history, type 2 diabetes well controlled with a recent hemoglobin A1c of 7 on 11/16, type 2 diabetes with peripheral neuropathy, previous left Charcot joint surgery bilateral Charcot joints currently, stage III chronic renal failure, hypertension, obstructive sleep apnea, history of MRSA and gastroesophageal reflux. 1/18; this is a patient with bilateral Charcot feet. Plain x-rays that I did of both of these wounds that are either at bone or precariously close to bone were done. On the right foot this showed possible lytic destruction involving the anterior aspect of the talus which may represent a wound osteomyelitis. An MRI was recommended. On the left there was no definite lytic destruction although the wound is deep undermining's and I think probably requires an MRI on this basis in any case 1/25; patient was supposed to have her MRI last Friday of her bilateral feet however they would not do it because there was silver alginate  in her dressings, will replace with calcium alginate today and will see if we can get her done today 2/1; patient did not get  her MRIs done last week because of issues with the MRI department receiving orders to do bilateral feet. She has 2 deep wounds in the setting of Charcot feet deformities bilaterally. Both of them at one point had exposed bone although I had trouble demonstrating that today. I am not sure that we can offload these very aggressively as I watched her leave the clinic last week her gait is very narrow and unsteady. 2/15; the MRI of her right foot did not show osteomyelitis potentially osteonecrosis of the talus. 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 The wounds are a large wound on the right medial foot and on the left a substantial wound on the left lateral plantar calcaneus. We have been using silver alginate. Ultimately I think we need to consider a total contact cast on the right while we work through the possibility of osteomyelitis in the left. I watched her walk out with her crutches I have some trepidation about the ability to walk with the cast on her right foot. Nevertheless with her Charcot deformities I do not think there is a way to heal these short of offloading them aggressively. 2/22; the culture of the bone in the left foot that I did last week showed a few Citrobacter Koseri as well as some streptococcal species. In my attempted bone for pathology did not actually yield a lot of bone the pathologist did not identify any osteomyelitis. 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 Is promised today and aggressive debridement of the right foot and a total contact cast which surprisingly she seemed to tolerate quite well 03/13/2019 upon evaluation today patient appears to be doing well with her total contact cast she is here for the first cast change. She had no areas of rubbing or any other  complications at this point. Overall things are doing quite well. 2/26; the patient was kindly seen by Dr. Orvan Falconer of infectious disease on 2/25. She is now on IV vancomycin PICC line placement in 2 days and oral Flagyl. This was in response to her MRI showing osteomyelitis of the left calcaneus concerning for osteomyelitis. We have been putting her right leg in a total contact cast. Our nurses report drainage. She is tolerating the total contact cast well. 3/4; the patient is started her IV antibiotics and is taking IV vancomycin and oral Flagyl. She appears to be tolerating these both well. This is for osteomyelitis involving the left calcaneus. The area on the right in the setting of bilateral Charcot deformities did not have any bone infection on MRI. We put a total contact cast on her with silver alginate as the primary dressing and she returns today in follow-up. Per our intake nurse there was too much drainage to leave this on all week therefore we will bring her back for an early change 3/8; follow-up total contact cast she is still having a lot of drainage probably too much drainage from the right foot wound will week with the same cast. She will be back on Thursday. The area on the left looks somewhat better 3/11; patient here for a total contact cast change. 3/15; patient came in for wound  care evaluation. She is still on IV vancomycin and oral Flagyl for osteomyelitis in the left foot. The area on the right foot we are putting in a total contact cast. 3/18 for total contact cast change on the right foot 3/22; the patient was here for wound review today. The area on the right foot is slightly smaller in terms of surface area. However the surface of the wound is very gritty and hyper granulated. More problematically the area on the plantar left foot has increased depth indeed in the center of this it probes to bone. We have been using Hydrofera Blue in both areas. She is on antibiotics as  prescribed by infectious disease IV vancomycin and oral Flagyl 3/25; the patient is in for total contact cast change. Our intake nurse was concerned about the amount of drainage which soaked right to the multiple layers we are putting on this. Wondered about options. The wound bed actually looks as healthy as this is looked although there may not be a lot of change in dimensions things are looking a lot better. If we are going to heal this woman's wound which is in the middle of her right Charcot foot this is going to need to be offloaded. There are not many alternatives 3/29; still not a lot of improvement although the surface of the right wound looks better. We have been using Hydrofera Blue 04/17/2019 upon evaluation today patient appears to be doing well with a total contact cast. With actually having to change this twice a week. She saw Dr. Leanord [name] for wound care earlier in the week this is for the cast change today. That is due to the fact that she has significant drainage. Overall the wound is been looking excellent however. 4/5; we continue to put a total contact cast on this patient with Hydrofera Blue. Still a lot of drainage which precludes a simple weekly change or changing this twice a week. 4/8; total contact cast changed today. Apparently the wound looks better per our intake nurse 4/12; patient here for wound care evaluation. Wounds on the right foot have not changed in dimension none about a month left foot looks similar. Apparently her antibiotics that we are giving her for osteomyelitis in the left foot complete on Wednesday. We have been using Hydrofera Blue on the right foot under a total contact cast. Nurses report greenish drainage although I think this may be discoloration from the [facility] 4/15; back for cast change wounds look quite good although still moderate amount of drainage. [name] contact cast reapplied otal 4/19;. Wound evaluation. Wound on the right foot is smaller  surface looks healthy. There is still the divot in the middle part of this wound but I could not feel any bone. Area on the left foot also looks better She has completed her antibiotics but still has the PICC line in. 4/23; patient comes back for a total contact cast change this was done in the standard fashion no issues 4/26; wound measures slightly larger on the right foot which is disappointing. She sees Dr. Orvan Falconer with regards to her antibiotics on Wednesday. I believe she is on vancomycin IV and oral Flagyl. 4/29; in for her routine total contact cast change. She saw Dr. Orvan Falconer this morning. He is asking about antibiotic continuation I will be in touch with him. I did not look at her wound today 5/3; patient saw Dr. Orvan Falconer on 4/28. I did send him a note asking about the continuation of perhaps an  oral regimen. I have not heard back. She still has an elevated sedimentation rate at 81. Her wounds are smaller. 5/6; in for her total contact cast change. We did not look at the wound today. She has seen Dr. Orvan Falconer and is now on oral Keflex and Flagyl. She seems to be tolerating these well 5/10; she had her PICC line removed. The wound on the right foot after some initial improvement has not made any improvement even with the total contact cast. Still having a lot of drainage. Likewise the area on the left foot really is not any better either 5/14; in for a cast change today. We will look at the right foot on Monday. If this is not making any progress I am going to try to cast the left foot. 5/17; the wound on the right foot is absolutely no different. I am going to put ointment on this area and change the cast of the left foot. We have been using silver alginate there 5/21; apparently the area on the right foot was better per our intake nurse although I did not see it. We did a standard total contact cast change on the left foot we have been applying silver alginate to the wound  here. 06/10/19-Area on the right foot per dimensions slightly better, the left foot has still some depth applying silver alginate to the wound with TCC 06/13/19-Patient here for Casting to Left foot 6/1; patient had the total contact cast were placed on the left foot. Noted some swelling in her right leg although the dimensions of the wound on the right foot are better. We have been using PolyMem on the right and silver alginate on the left under the cast 6/7; we continue to make nice progress in both her plantar feet wound. We are putting the total contact cast on the left. Surprisingly in spite of this the area on the right medial foot in the middle of her Charcot deformities actually is making progress as well 07/01/2019 upon evaluation today patient actually appears to be doing quite well with regard to her wounds on the feet compared to when I saw her last that has been quite some time. With that being said I do believe the total contact cast has been beneficial in this left foot and she has a lot of signs of improvement she does have some callus overhanging which is caused a little bit of a ledge and an issue here. I do believe that we may be able to do something to help in that regard. Fortunately otherwise the wound seems to be progressing quite nicely. 6/21; the patient's area on the left foot that we are currently casting is just about closed. The area on the right medial foot has a healthy looking surface but may be not changed as much from last week in terms of surface area. We are using silver alginate on the right and polymen Ag under the cast 6/28; only a small open area on the left foot remains. Using silver alginate under a total contact cast. She has a diabetic shoe I have asked her to bring that for next week where this may be closed. The area on the right foot with a Charcot deformity is also measuring slightly smaller. We are using PolyMem Ag here 7/6; left foot is totally closed.  She does not require a total contact cast. She will graduate to her own diabetic shoe. ooThe area on the right foot with a Charcot deformity measuring about  the same. We have been using polymen under compression wraps. She did not do well in this area with a total contact cast 7/13; left foot remains intact. She is skillfully covering this area with foam and using her diabetic shoe. Slight area improvement of the right foot wound. Still rolled senescent looking edges. We have been using polymen 7/19; left foot remains intact. Wound measuring slightly smaller on the right foot in the middle of the Charcot deformity. We have been using PolyMem Ag 7/26; per the patient left foot remains intact. The wound on the right foot looks slightly smaller but dimensions are measuring the same. We have been using polymen Ag Objective Constitutional Sitting or standing Blood Pressure is within target range for patient.. Pulse regular and within target range for patient.Marland Kitchen Respirations regular, non-labored and within target range.. Temperature is normal and within the target range for the patient.Marland Kitchen Appears in no distress. Vitals Time Taken: 2:00 PM, Height: 68 in, Weight: 265 lbs, BMI: 40.3, Temperature: 98.4 F, Pulse: 67 bpm, Respiratory Rate: 18 breaths/min, Blood Pressure: 123/67 mmHg. General Notes: Wound exam ooLeft foot remains closed. Right foot dimensions are about the same. I used a #5 curette to remove callus thick skin and debris on the wound surface this looks quite healthy. No evidence of infection Integumentary (Hair, Skin) Wound #9 status is Open. Original cause of wound was Blister. The wound is located on the Right,Medial Foot. The wound measures 2.3cm length x 1.7cm width x 0.6cm depth; 3.071cm^2 area and 1.843cm^3 volume. There is Fat Layer (Subcutaneous Tissue) Exposed exposed. There is no tunneling or undermining noted. There is a medium amount of serosanguineous drainage noted. The wound  margin is well defined and not attached to the wound base. There is large (67- 100%) red, pink granulation within the wound bed. There is no necrotic tissue within the wound bed. 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 There was a Excisional Skin/Subcutaneous Tissue Debridement with a total area of 3.91 sq cm performed by [name]. With the following instrument(s): Curette to remove Viable and Non-Viable tissue/material. Material removed includes Callus and Subcutaneous Tissue and. No specimens were taken. A time out was conducted at 14:40, prior to the start of the procedure. A Moderate amount of bleeding was controlled with Pressure. The procedure was tolerated well with a pain level of 0 throughout and a pain level of 0 following the procedure. Post Debridement Measurements: 2.3cm length x 1.7cm width x 0.6cm depth; 1.843cm^3 volume. Character of Wound/Ulcer Post Debridement is improved. Severity of Tissue Post Debridement is: Fat layer exposed. Post procedure Diagnosis Wound #9: Same as Pre-Procedure Pre-procedure diagnosis of Wound #9 is a Diabetic Wound/Ulcer of the Lower Extremity located on the Right,Medial Foot . There was a Three Layer Compression Therapy Procedure by Sarah Abts, RN. Post procedure Diagnosis Wound #9: Same as Pre-Procedure Plan Follow-up Appointments: Return Appointment in 2 weeks. - [name] visit Nurse Visit: - 1 week Dressing Change Frequency: Wound #9  Right,Medial Foot: Do not change entire dressing for one week. Wound Cleansing: Wound #  9 Right,Medial Foot: May shower with protection. - use cast protector Primary Wound Dressing: Wound #9 Right,Medial Foot: Polymem Silver Secondary Dressing: Wound #9 Right,Medial Foot: Foam - foam donut ABD pad Zetuvit or Kerramax Edema Control: 3 Layer Compression System - Right Lower Extremity Avoid standing for long periods of time Elevate legs to the level of the heart or above for 30 minutes daily and/or when sitting, a frequency of: - throughout the day Exercise regularly Off-Loading: Open toe surgical shoe to: - right foot 1. Right medial foot. This is in the middle of the Charcot deformity. 2. Debrided with a #5 curette. The wound cleans up quite nicely but overall dimensions are not much better this week. 3. Previously did not do well with a total contact cast for a prolonged period on this foot 4. Using polymen Ag/ABDs/3 layer compression Electronic Signature(s) Signed: 08/12/2019 6:00:25 PM By: Sarah Najjar [name] Entered By: [name] on [name] 15:15:51 -------------------------------------------------------------------------------- SuperBill Details Patient Name: Date of Service: [facility] [place], [name] 08/12/2019 Medical Record Number: [id number] Patient Account Number: [id number] Date of Birth/Sex: Treating RN: 1955/10/01 (64 y.o. [name] Primary Care Provider: MA SO [name], Chrisney Sarah LSA DA [name] Other Clinician: Referring Provider: Treating Provider/Extender: Abe People, ELHA Sarah LSA DA [name] Weeks in Treatment: 28 Diagnosis Coding ICD-10 Codes Code Description E11.621 Type 2 diabetes mellitus with foot ulcer L97.511 Non-pressure chronic ulcer of other part of right foot limited to breakdown of skin E11.42 Type 2 diabetes mellitus with diabetic polyneuropathy M14.671 Charcot's joint, right ankle and foot M14.672 Charcot's joint, left ankle and  foot 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, left ankle and foot Facility Procedures CPT4 Code: [id number] Description: 11042 - DEB SUBQ TISSUE 20 SQ CM/< ICD-10 Diagnosis Description L97.511 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

## 2019-08-14 NOTE — Progress Notes (Signed)
KAYTI, POSS ([id number]) Visit Report for 08/12/2019 Arrival Information Details Patient Name: Date of Service: AISHA, GREENBERGER 08/12/2019 1:30 PM Medical Record Number: [id number] Patient Account Number: [id number] Date of Birth/Sex: Treating RN: 1955-06-28 (64 y.o. F) Epps, Carrie Primary Care Alexee Delsanto: MA SO UDI, [place] MA LSA DA T Other Clinician: Referring Paizley Ramella: Treating Ramani Riva/Extender: Baltazar Najjar MA SO UDI, ELHA MA LSA DA T Weeks in Treatment: 28 Visit Information History Since Last Visit All ordered tests and consults were completed: No Patient Arrived: Crutches Added or deleted any medications: No Arrival Time: 13:59 Any new allergies or adverse reactions: No Accompanied By: self Had a fall or experienced change in No Transfer Assistance: None activities of daily living that may affect Patient Identification Verified: Yes risk of falls: Secondary Verification Process Completed: Yes Signs or symptoms of abuse/neglect since last visito No Patient Requires Transmission-Based Precautions: No Hospitalized since last visit: No Patient Has Alerts: No Implantable device outside of the clinic excluding No cellular tissue based products placed in the center since last visit: Has Dressing in Place as Prescribed: Yes Pain Present Now: No Electronic Signature(s) Signed: 08/14/2019 6:04:59 PM By: Yevonne Pax RN Entered By: Yevonne Pax on 08/12/2019 13:59:50 -------------------------------------------------------------------------------- Compression Therapy Details Patient Name: Date of Service: [name], Avonell J. 08/12/2019 1:30 PM Medical Record Number: [id number] Patient Account Number: [id number] Date of Birth/Sex: Treating RN: Apr 17, 1955 (64 y.o. [name] Primary Care Thedford Bunton: MA SO UDI, Judene Companion MA LSA DA T Other Clinician: Referring Liyah Higham: Treating Maxden Naji/Extender: Baltazar Najjar MA SO UDI, ELHA MA LSA DA T Weeks in Treatment:  28 Compression Therapy Performed for Wound Assessment: Wound #9 Right,Medial Foot Performed By: Clinician Zandra Abts, RN Compression Type: Three Layer Post Procedure Diagnosis Same as Pre-procedure Electronic Signature(s) Signed: 08/14/2019 6:13:34 PM By: Zandra Abts RN, BSN Entered By: Zandra Abts on 08/12/2019 14:42:02 -------------------------------------------------------------------------------- Encounter Discharge Information Details Patient Name: Date of Service: [name], Alyza J. 08/12/2019 1:30 PM Medical Record Number: [id number] Patient Account Number: [id number] Date of Birth/Sex: Treating RN: 12-Nov-1955 (64 y.o. [name] Primary Care Cesia Orf: MA SO UDI, [place] MA LSA DA T Other Clinician: Referring Eh Sesay: Treating Arianny Pun/Extender: Baltazar Najjar MA SO UDI, ELHA MA LSA DA T Weeks in Treatment: 5 Encounter Discharge Information Items Post Procedure Vitals Discharge Condition: Stable Temperature (F): 98.4 Ambulatory Status: Crutches Pulse (bpm): 67 Discharge Destination: Home Respiratory Rate (breaths/min): 18 Transportation: Private Auto Blood Pressure (mmHg): 123/67 Accompanied By: cousin Schedule Follow-up Appointment: Yes Clinical Summary of Care: Patient Declined Electronic Signature(s) Signed: 08/12/2019 6:12:56 PM By: Zenaida Deed RN, BSN Entered By: Zenaida Deed on 08/12/2019 15:04:34 -------------------------------------------------------------------------------- Lower Extremity Assessment Details Patient Name: Date of Service: ENO [facility], Debi J. 08/12/2019 1:30 PM Medical Record Number: [id number] Patient Account Number: [id number] Date of Birth/Sex: Treating RN: 03/04/1955 (64 y.o. [name] Primary Care Celisa Schoenberg: MA SO UDI, [place] MA LSA DA T Other Clinician: Referring Kirklin Mcduffee: Treating Darla [name]/Extender: Baltazar Najjar MA SO UDI, ELHA MA LSA DA T Weeks in Treatment: 28 Edema Assessment Assessed: [Left:  No] [Right: No] Edema: [Left: Ye] [Right: s] Calf Left: Right: Point of Measurement: 43 cm From Medial Instep cm 35 cm Ankle Left: Right: Point of Measurement: 12 cm From Medial Instep cm 24.5 cm Electronic Signature(s) Signed: 08/14/2019 6:04:59 PM By: Yevonne Pax RN Entered By: Yevonne Pax on 08/12/2019 14:11:35 -------------------------------------------------------------------------------- Multi-Disciplinary Care Plan Details Patient Name: Date of Service: [facility], Pierina J. 08/12/2019 1:30 PM Medical Record Number: [id number] Patient Account Number:  [id number] Date of Birth/Sex: Treating RN: 1955/11/06 (64 y.o. [name] Primary Care Aaronjames Kelsay: MA SO UDI, Judene Companion MA LSA DA T Other Clinician: Referring Brayden Betters: Treating Tiwan Schnitker/Extender: Baltazar Najjar MA SO UDI, ELHA MA LSA DA T Weeks in Treatment: 28 Active Inactive Wound/Skin Impairment Nursing Diagnoses: Impaired tissue integrity Knowledge deficit related to ulceration/compromised skin integrity Goals: Patient/caregiver will verbalize understanding of skin care regimen Date Initiated: 01/28/2019 Target Resolution Date: 09/06/2019 Goal Status: Active Interventions: Assess patient/caregiver ability to obtain necessary supplies Assess patient/caregiver ability to perform ulcer/skin care regimen upon admission and as needed Assess ulceration(s) every visit Provide education on ulcer and skin care Notes: Electronic Signature(s) Signed: 08/14/2019 6:13:34 PM By: Zandra Abts RN, BSN Entered By: Zandra Abts on 08/12/2019 14:59:08 -------------------------------------------------------------------------------- Pain Assessment Details Patient Name: Date of Service: [name], Sheyna J. 08/12/2019 1:30 PM Medical Record Number: [id number] Patient Account Number: [id number] Date of Birth/Sex: Treating RN: 06-14-1955 (64 y.o. [name] Primary Care Sharika Mosquera: MA SO UDI, [place] MA LSA DA T Other  Clinician: Referring Blythe Veach: Treating Luvern Mcisaac/Extender: Baltazar Najjar MA SO UDI, ELHA MA LSA DA T Weeks in Treatment: 28 Active Problems Location of Pain Severity and Description of Pain Patient Has Paino No Site Locations Pain Management and Medication Current Pain Management: Electronic Signature(s) Signed: 08/14/2019 6:04:59 PM By: Yevonne Pax RN Entered By: Yevonne Pax on 08/12/2019 14:01:17 -------------------------------------------------------------------------------- Patient/Caregiver Education Details Patient Name: Date of Service: Gypsy Decant 7/26/2021andnbsp1:30 PM Medical Record Number: [id number] Patient Account Number: [id number] Date of Birth/Gender: Treating RN: 03-23-1955 (64 y.o. [name] Primary Care Physician: MA SO UDI, Judene Companion MA LSA DA T Other Clinician: Referring Physician: Treating Physician/Extender: Baltazar Najjar MA SO UDI, ELHA MA LSA DA T Weeks in Treatment: 36 Education Assessment Education Provided To: Patient Education Topics Provided Wound/Skin Impairment: Methods: Explain/Verbal Responses: State content correctly Electronic Signature(s) Signed: 08/14/2019 6:13:34 PM By: Zandra Abts RN, BSN Entered By: Zandra Abts on 08/12/2019 14:59:54 -------------------------------------------------------------------------------- Wound Assessment Details Patient Name: Date of Service: [name], Jahmya J. 08/12/2019 1:30 PM Medical Record Number: [id number] Patient Account Number: [id number] Date of Birth/Sex: Treating RN: May 28, 1955 (64 y.o. Sharyne Peach, Carrie Primary Care Jahmeek Shirk: MA SO UDI, [place] MA LSA DA T Other Clinician: Referring Mariel Lukins: Treating Arlis Everly/Extender: Baltazar Najjar MA SO UDI, ELHA MA LSA DA T Weeks in Treatment: 28 Wound Status Wound Number: 9 Primary Diabetic Wound/Ulcer of the Lower Extremity Etiology: Wound Location: Right, Medial Foot Wound Open Wounding Event: Blister Status: Date  Acquired: 06/18/2018 Comorbid Glaucoma, Chronic sinus problems/congestion, Anemia, Sleep Weeks Of Treatment: 28 History: Apnea, Hypertension, Type II Diabetes, Rheumatoid Arthritis, Clustered Wound: No Neuropathy Photos Photo Uploaded By: Benjaman Kindler on 08/14/2019 12:48:23 Wound Measurements Length: (cm) 2.3 Width: (cm) 1.7 Depth: (cm) 0.6 Area: (cm) 3.071 Volume: (cm) 1.843 % Reduction in Area: 72.1% % Reduction in Volume: 81.4% Epithelialization: Small (1-33%) Tunneling: No Undermining: No Wound Description Classification: Grade 2 Wound Margin: Well defined, not attached Exudate Amount: Medium Exudate Type: Serosanguineous Exudate Color: red, brown Foul Odor After Cleansing: No Slough/Fibrino No Wound Bed Granulation Amount: Large (67-100%) Exposed Structure Granulation Quality: Red, Pink Fascia Exposed: No Necrotic Amount: None Present (0%) Fat Layer (Subcutaneous Tissue) Exposed: Yes Tendon Exposed: No Muscle Exposed: No Joint Exposed: No Bone Exposed: No Treatment Notes Wound #9 (Right, Medial Foot) 2. Periwound Care Barrier cream Moisturizing lotion 3. Primary Dressing Applied Polymem Ag 4. Secondary Dressing ABD Pad Dry Gauze Foam Other secondary dressing (specify in notes) 6. Support Layer [employer]  3 layer compression wrap Notes zetuvit. stockinette Electronic Signature(s) Signed: 08/14/2019 6:04:59 PM By: Yevonne Pax RN Entered By: Yevonne Pax on 08/12/2019 14:12:13 -------------------------------------------------------------------------------- Vitals Details Patient Name: Date of Service: [name], Chantae J. 08/12/2019 1:30 PM Medical Record Number: [id number] Patient Account Number: [id number] Date of Birth/Sex: Treating RN: Jan 21, 1955 (64 y.o. Sharyne Peach, Carrie Primary Care Etola Mull: MA SO UDI, [place] MA LSA DA T Other Clinician: Referring Shavette Shoaff: Treating Stephaniemarie Stoffel/Extender: Baltazar Najjar MA SO UDI, ELHA MA LSA DA T Weeks in Treatment:  28 Vital Signs Time Taken: 14:00 Temperature (F): 98.4 Height (in): 68 Pulse (bpm): 67 Weight (lbs): 265 Respiratory Rate (breaths/min): 18 Body Mass Index (BMI): 40.3 Blood Pressure (mmHg): 123/67 Reference Range: 80 - 120 mg / dl Electronic Signature(s) Signed: 08/14/2019 6:04:59 PM By: Yevonne Pax RN Entered By: Yevonne Pax on 08/12/2019 14:00:38

## 2019-08-19 ENCOUNTER — Encounter (HOSPITAL_BASED_OUTPATIENT_CLINIC_OR_DEPARTMENT_OTHER): Payer: Medicare Other | Attending: Internal Medicine | Admitting: Internal Medicine

## 2019-08-19 DIAGNOSIS — E11621 Type 2 diabetes mellitus with foot ulcer: Secondary | ICD-10-CM | POA: Insufficient documentation

## 2019-08-19 DIAGNOSIS — L97511 Non-pressure chronic ulcer of other part of right foot limited to breakdown of skin: Secondary | ICD-10-CM | POA: Diagnosis not present

## 2019-08-19 DIAGNOSIS — E11610 Type 2 diabetes mellitus with diabetic neuropathic arthropathy: Secondary | ICD-10-CM | POA: Insufficient documentation

## 2019-08-19 DIAGNOSIS — M86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, left ankle and foot: Secondary | ICD-10-CM | POA: Diagnosis not present

## 2019-08-19 DIAGNOSIS — E1169 Type 2 diabetes mellitus with other specified complication: Secondary | ICD-10-CM | POA: Diagnosis not present

## 2019-08-19 DIAGNOSIS — E1142 Type 2 diabetes mellitus with diabetic polyneuropathy: Secondary | ICD-10-CM | POA: Insufficient documentation

## 2019-08-19 NOTE — Progress Notes (Signed)
[name], [name] ([id number]) Visit Report for 08/19/2019 Arrival Information Details Patient Name: Date of Service: [name], [name] 08/19/2019 10:45 A M Medical Record Number: [id number] Patient Account Number: [id number] Date of Birth/Sex: Treating RN: 1955-11-17 (63 y.o. F) Dwiggins, Shannon Primary Care Zackry Deines: MA SO UDI, [place] MA LSA DA T Other Clinician: Referring Rayel Santizo: Treating Lochlyn Zullo/Extender: Baltazar Najjar MA SO UDI, ELHA MA LSA DA T Weeks in Treatment: 57 Visit Information History Since Last Visit Added or deleted any medications: No Patient Arrived: Crutches Any new allergies or adverse reactions: No Arrival Time: 10:46 Had a fall or experienced change in No Accompanied By: self activities of daily living that may affect Transfer Assistance: None risk of falls: Patient Identification Verified: Yes Signs or symptoms of abuse/neglect since last visito No Secondary Verification Process Completed: Yes Hospitalized since last visit: No Patient Requires Transmission-Based Precautions: No Implantable device outside of the clinic excluding No Patient Has Alerts: No cellular tissue based products placed in the center since last visit: Has Dressing in Place as Prescribed: Yes Has Compression in Place as Prescribed: Yes Pain Present Now: No Electronic Signature(s) Signed: 08/19/2019 12:26:20 PM By: [name] Entered By: [name] on 08/19/2019 10:46:34 -------------------------------------------------------------------------------- Compression Therapy Details Patient Name: Date of Service: [name], [name] 08/19/2019 10:45 A M Medical Record Number: [id number] Patient Account Number: [id number] Date of Birth/Sex: Treating RN: 1955-04-14 (64 y.o. [name] Primary Care Kijuana Ruppel: MA SO UDI, [place] MA LSA DA T Other Clinician: Referring Maeson Purohit: Treating Terique Kawabata/Extender: Baltazar Najjar MA SO UDI, ELHA MA LSA DA T Weeks in Treatment:  29 Compression Therapy Performed for Wound Assessment: Wound #9 Right,Medial Foot Performed By: Clinician [name], RN Compression Type: Three Layer Electronic Signature(s) Signed: 08/19/2019 12:26:20 PM By: [name] Entered By: [name] on 08/19/2019 10:59:21 -------------------------------------------------------------------------------- Encounter Discharge Information Details Patient Name: Date of Service: [name], [name] 08/19/2019 10:45 A M Medical Record Number: [id number] Patient Account Number: [id number] Date of Birth/Sex: Treating RN: 13-Jun-1955 (64 y.o. [name] Primary Care Jasmond River: MA SO UDI, Judene Companion MA LSA DA T Other Clinician: Referring Celia Gibbons: Treating Lucianna Ostlund/Extender: Baltazar Najjar MA SO UDI, ELHA MA LSA DA T Weeks in Treatment: 20 Encounter Discharge Information Items Discharge Condition: Stable Ambulatory Status: Crutches Discharge Destination: Home Transportation: Private Auto Accompanied By: self Schedule Follow-up Appointment: Yes Clinical Summary of Care: Patient Declined Electronic Signature(s) Signed: 08/19/2019 12:26:20 PM By: [name] Entered By: [name] on 08/19/2019 11:15:25 -------------------------------------------------------------------------------- Patient/Caregiver Education Details Patient Name: Date of Service: [name] 8/2/2021andnbsp10:45 A M Medical Record Number: [id number] Patient Account Number: [id number] Date of Birth/Gender: Treating RN: 29-Aug-1955 (64 y.o. [name] Primary Care Physician: MA SO UDI, Judene Companion MA LSA DA T Other Clinician: Referring Physician: Treating Physician/Extender: Baltazar Najjar MA SO UDI, ELHA MA LSA DA T Weeks in Treatment: 25 Education Assessment Education Provided To: Patient Education Topics Provided Wound/Skin Impairment: Handouts: Caring for Your Ulcer Methods: Explain/Verbal Responses: State content  correctly Electronic Signature(s) Signed: 08/19/2019 12:26:20 PM By: [name] Entered By: [name] on 08/19/2019 11:15:12 -------------------------------------------------------------------------------- Wound Assessment Details Patient Name: Date of Service: [name], [name] 08/19/2019 10:45 A M Medical Record Number: [id number] Patient Account Number: [id number] Date of Birth/Sex: Treating RN: 10-08-1955 (64 y.o. F) [name] Primary Care Faaris Arizpe: MA Hedy Jacob MA LSA DA T Other Clinician: Referring Renlee Floor: Treating Hadiyah Maricle/Extender: Baltazar Najjar MA SO UDI, ELHA MA LSA DA T Weeks in Treatment: 29 Wound Status Wound  Number: 9 Primary Diabetic Wound/Ulcer of the Lower Extremity Etiology: Wound Location: Right, Medial Foot Wound Open Wounding Event: Blister Status: Date Acquired: 06/18/2018 Comorbid Glaucoma, Chronic sinus problems/congestion, Anemia, Sleep Weeks Of Treatment: 29 History: Apnea, Hypertension, Type II Diabetes, Rheumatoid Arthritis, Clustered Wound: No Neuropathy Wound Measurements Length: (cm) 2.3 Width: (cm) 1.7 Depth: (cm) 0.6 Area: (cm) 3.071 Volume: (cm) 1.843 % Reduction in Area: 72.1% % Reduction in Volume: 81.4% Epithelialization: Small (1-33%) Tunneling: No Undermining: No Wound Description Classification: Grade 2 Wound Margin: Well defined, not attached Exudate Amount: Large Exudate Type: Serosanguineous Exudate Color: red, brown Foul Odor After Cleansing: No Slough/Fibrino No Wound Bed Granulation Amount: Large (67-100%) Exposed Structure Granulation Quality: Red, Pink Fascia Exposed: No Necrotic Amount: None Present (0%) Fat Layer (Subcutaneous Tissue) Exposed: Yes Tendon Exposed: No Muscle Exposed: No Joint Exposed: No Bone Exposed: No Treatment Notes Wound #9 (Right, Medial Foot) 1. Cleanse With Wound Cleanser Soap and water 2. Periwound Care Barrier cream Moisturizing lotion 3. Primary  Dressing Applied Polymem Ag 4. Secondary Dressing ABD Pad Dry Gauze Foam Kerramax/Xtrasorb 6. Support Layer Applied 3 layer compression wrap Notes zetuvit. Scientist, research (physical sciences)) Signed: 08/19/2019 12:26:20 PM By: [name] Entered By: [name] on 08/19/2019 10:58:55 -------------------------------------------------------------------------------- Vitals Details Patient Name: Date of Service: [name], [name] 08/19/2019 10:45 A M Medical Record Number: [id number] Patient Account Number: [id number] Date of Birth/Sex: Treating RN: Aug 29, 1955 (64 y.o. F) Dwiggins, Shannon Primary Care Makai Dumond: MA SO UDI, [place] MA LSA DA T Other Clinician: Referring Kayson Bullis: Treating Darrelle Barrell/Extender: Baltazar Najjar MA SO UDI, ELHA MA LSA DA T Weeks in Treatment: 29 Vital Signs Time Taken: 10:45 Temperature (F): 98.2 Height (in): 68 Pulse (bpm): 68 Weight (lbs): 265 Respiratory Rate (breaths/min): 18 Body Mass Index (BMI): 40.3 Blood Pressure (mmHg): 115/71 Reference Range: 80 - 120 mg / dl Electronic Signature(s) Signed: 08/19/2019 12:26:20 PM By: [name] Entered By: [name] on 08/19/2019 10:53:36

## 2019-08-20 ENCOUNTER — Encounter: Payer: Self-pay | Admitting: Internal Medicine

## 2019-08-20 ENCOUNTER — Other Ambulatory Visit: Payer: Self-pay

## 2019-08-20 ENCOUNTER — Ambulatory Visit (INDEPENDENT_AMBULATORY_CARE_PROVIDER_SITE_OTHER): Payer: Medicare Other | Admitting: Internal Medicine

## 2019-08-20 VITALS — BP 132/74 | HR 68 | Temp 98.0°F | Ht 69.0 in | Wt 249.4 lb

## 2019-08-20 DIAGNOSIS — Z1231 Encounter for screening mammogram for malignant neoplasm of breast: Secondary | ICD-10-CM

## 2019-08-20 DIAGNOSIS — E119 Type 2 diabetes mellitus without complications: Secondary | ICD-10-CM | POA: Diagnosis present

## 2019-08-20 DIAGNOSIS — E1169 Type 2 diabetes mellitus with other specified complication: Secondary | ICD-10-CM

## 2019-08-20 DIAGNOSIS — I10 Essential (primary) hypertension: Secondary | ICD-10-CM

## 2019-08-20 DIAGNOSIS — M86372 Chronic multifocal osteomyelitis, left ankle and foot: Secondary | ICD-10-CM

## 2019-08-20 NOTE — Assessment & Plan Note (Addendum)
Last hemoglobin A1c 06/07/2019 was 6.  Patient is on Janumet 50-500 mg twice daily Last BMP with stable kidney function with GFR of 54.  -Continue Janumet 50-500 mg twice daily -Follow-up in clinic in a month to repeat A1c -Encourage patient to get COVID vaccine soon as possible.

## 2019-08-20 NOTE — Progress Notes (Signed)
   CC: DM follow up  HPI:  [name] is a 64 y.o. female with PMHx as documented below, presented for follow up of DM . Please refer to problem based charting for further details and assessment and plan of current problem and chronic medical conditions.  Past Medical History:  Diagnosis Date  . Chronic normocytic anemia   . CKD (chronic kidney disease) stage 3, GFR 30-59 ml/min 02/02/2013  . Diabetes mellitus type II, controlled (HCC) 06/17/2009  . Essential hypertension 06/17/2009  . GERD (gastroesophageal reflux disease)   . Glaucoma   . H/O hiatal hernia   . Hyperlipidemia   . MRSA (methicillin resistant Staphylococcus aureus)   . Obstructive sleep apnea 05/26/2010   Review of Systems:   Constitutional: Negative for chills and fever.    Physical Exam:  Vitals:   08/20/19 1440  BP: 132/74  Pulse: 68  Temp: 98 F (36.7 C)  TempSrc: Oral  SpO2: 100%  Weight: 249 lb 6.4 oz (113.1 kg)  Height: 5\' 9"  (1.753 m)   Constitutional: Well-developed and well-nourished. No acute distress.  HENT:  Head: Normocephalic and atraumatic.  Eyes: Conjunctivae are normal, EOM nl Cardiovascular:  RRR, nl S1S2, no murmur Extremities: Extremities are wrapped and covered with socks.  Trace lower extremity bilaterally. Respiratory: Effort normal and breath sounds normal. No respiratory distress. No wheezes.  GI: Soft. Bowel sounds are normal. No distension. There is no tenderness.  Neurological: Is alert and oriented x 3  Skin: Not diaphoretic. No erythema Psychiatric: Normal mood and affect. Behavior is normal. Judgment and thought content normal.   Assessment & Plan:   See Encounters Tab for problem based charting.  Patient discussed with Dr. 

## 2019-08-20 NOTE — Patient Instructions (Addendum)
Thank you for allowing [place] to provide your care today.   Today we discussed COVID vaccine, screening tests, Diabetes, also your chronic wound. Please get COVID vaccine as soon as possible.   I am glad that you feel better and your wounds are healing. Please continue your current medications and come back to clinic in a month to check HbA1c and adjust your medication if needed then.   I have ordered mammogram for screening.  We will discuss available screening test for colon cancer screening next visit. Please continue follow up with wound clinic.   Should you have any questions or concerns please call the internal medicine clinic at [phone].    Thank you!

## 2019-08-20 NOTE — Assessment & Plan Note (Signed)
Place order for screening mammogram today.

## 2019-08-20 NOTE — Assessment & Plan Note (Signed)
BP today is 132/74.   -Continue lisinopril 10 mg daily

## 2019-08-20 NOTE — Assessment & Plan Note (Signed)
Patient has been following with Dr. Orvan Falconer at infectious disease clinic and finished IV antibiotic treatment for osteomyelitis.  Appears to be resolved successfully. We appreciate the care provided by ID team. Encourage patient to get the Covid vaccine as soon as possible. She is aware about necessity of being vaccinated given being high risk with comorbidities. she is agreeable to receive the vaccine.

## 2019-08-21 NOTE — Progress Notes (Signed)
Internal Medicine Clinic Attending  Case discussed with Dr. Masoudi  At the time of the visit.  We reviewed the resident's history and exam and pertinent patient test results.  I agree with the assessment, diagnosis, and plan of care documented in the resident's note.  

## 2019-08-26 ENCOUNTER — Encounter (HOSPITAL_BASED_OUTPATIENT_CLINIC_OR_DEPARTMENT_OTHER): Payer: Medicare Other | Admitting: Internal Medicine

## 2019-08-26 DIAGNOSIS — E11621 Type 2 diabetes mellitus with foot ulcer: Secondary | ICD-10-CM | POA: Diagnosis not present

## 2019-08-26 NOTE — Progress Notes (Signed)
SHELLEY, PULS ([id number]) Visit Report for 08/26/2019 HPI Details Patient Name: Date of Service: [name], [name] 08/26/2019 1:45 PM Medical Record Number: [id number] Patient Account Number: [id number] Date of Birth/Sex: Treating RN: 08-Nov-1955 (64 y.o. [name] Primary Care Provider: MA SO UDI, ELHA MA LSA DA T Other Clinician: Referring Provider: Treating Provider/Extender: Cassandria Anger MA SO UDI, ELHA MA LSA DA T Weeks in Treatment: 30 History of Present Illness HPI Description: 64 yrs old bf here for follow up recurrent left charcot foot ulcer. She has DM. She is not a smoker. She states a blister developed 3 months ago at site of previous breakdown from 1 year ago. It was debrided at the podiatrist's office. She went to the ER and then sent here. She denies pain. Xray was negative for osteo. Culture from this clinic negative. 09/08/[name] to [name] for Charcot foot. Seen by him and MRI scheduled 09/19/14. Prior silver collagen but this was not ordered last visit, patient has been rinsing with saline alone. Re institute silver collagen every other day. F/u 2 weeks with [name] as Labor Day holiday. Supplies ordered. 09/25/14; this is a patient with a Charcot foot on the left. she has a wound over the plantar aspect of her left calcaneus. She has been seen by [name] of orthopedic surgery and has had an MRI. She is apparently going within the next week or 2 for corrective surgery which will involve an external fixator. I don't have any of the details of this. 10/06/14; The patient is a 64 yrs old bf with a left Charcot foot and ulcer on the plantar. 12/01/14 Returns post surgery with [name]. Has follow up visit later this week with him, currently in facility and NWB over this extremity. States no drainage from foot and no current wound care. On exam callus and scab in place, suspect healed. Has external fixator in place. Will defer to [name]  for management, ok to place moisturizing cream over scabs and callus and she will call if she requires follow up here. READMISSION 01/28/2019 Patient was in the clinic in 2016 for a wound on the plantar aspect of her left calcaneus. Predominantly cared for by [name] she was referred to [name] and had corrective surgery for the position of her left ankle I believe. She had previously been here in 2015 and then prior to that in 2011 2012 that I do not have these records. More recently she has developed fairly extensive wounds on the right medial foot, left lateral foot and a small area on the right medial great toe. Right medial foot wound has been there for 6 to 7 months, left foot wound for 2 to 3 months and the right great toe only over the last few weeks. She has been followed by [name] at Dr. Laverta [place] office it sounds as though she has been undergoing callus paring and silver nitrate. The patient has been washing these off with soap and water and plan applying dry dressing Past medical history, type 2 diabetes well controlled with a recent hemoglobin A1c of 7 on 11/16, type 2 diabetes with peripheral neuropathy, previous left Charcot joint surgery bilateral Charcot joints currently, stage III chronic renal failure, hypertension, obstructive sleep apnea, history of MRSA and gastroesophageal reflux. 1/18; this is a patient with bilateral Charcot feet. Plain x-rays that I did of both of these wounds that are either at bone or precariously close to bone  were done. On the right foot this showed possible lytic destruction involving the anterior aspect of the talus which may represent a wound osteomyelitis. An MRI was recommended. On the left there was no definite lytic destruction although the wound is deep undermining's and I think probably requires an MRI on this basis in any case 1/25; patient was supposed to have her MRI last Friday of her bilateral feet however they would not do  it because there was silver alginate in her dressings, will replace with calcium alginate today and will see if we can get her done today 2/1; patient did not get her MRIs done last week because of issues with the MRI department receiving orders to do bilateral feet. She has 2 deep wounds in the setting of Charcot feet deformities bilaterally. Both of them at one point had exposed bone although I had trouble demonstrating that today. I am not sure that we can offload these very aggressively as I watched her leave the clinic last week her gait is very narrow and unsteady. 2/15; the MRI of her right foot did not show osteomyelitis potentially osteonecrosis of the talus. 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 The wounds are a large wound on the right medial foot and on the left a substantial wound on the left lateral plantar calcaneus. We have been using silver alginate. Ultimately I think we need to consider a total contact cast on the right while we work through the possibility of osteomyelitis in the left. I watched her walk out with her crutches I have some trepidation about the ability to walk with the cast on her right foot. Nevertheless with her Charcot deformities I do not think there is a way to heal these short of offloading them aggressively. 2/22; the culture of the bone in the left foot that I did last week showed a few Citrobacter Koseri as well as some streptococcal species. In my attempted bone for pathology did not actually yield a lot of bone the pathologist did not identify any osteomyelitis. 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 Is promised today and aggressive debridement of the right foot and a total contact cast which surprisingly she seemed to tolerate quite well 03/13/2019 upon  evaluation today patient appears to be doing well with her total contact cast she is here for the first cast change. She had no areas of rubbing or any other complications at this point. Overall things are doing quite well. 2/26; the patient was kindly seen by Dr. Megan Salon of infectious disease on 2/25. She is now on IV vancomycin PICC line placement in 2 days and oral Flagyl. This was in response to her MRI showing osteomyelitis of the left calcaneus concerning for osteomyelitis. We have been putting her right leg in a total contact cast. Our nurses report drainage. She is tolerating the total contact cast well. 3/4; the patient is started her IV antibiotics and is taking IV vancomycin and oral Flagyl. She appears to be tolerating these both well. This is for osteomyelitis involving the left calcaneus. The area on the right in the setting of bilateral Charcot deformities did not have any bone infection on MRI. We put a total contact cast on her with silver alginate as the primary dressing and she returns today in follow-up. Per  our intake nurse there was too much drainage to leave this on all week therefore we will bring her back for an early change 3/8; follow-up total contact cast she is still having a lot of drainage probably too much drainage from the right foot wound will week with the same cast. She will be back on Thursday. The area on the left looks somewhat better 3/11; patient here for a total contact cast change. 3/15; patient came in for wound care evaluation. She is still on IV vancomycin and oral Flagyl for osteomyelitis in the left foot. The area on the right foot we are putting in a total contact cast. 3/18 for total contact cast change on the right foot 3/22; the patient was here for wound review today. The area on the right foot is slightly smaller in terms of surface area. However the surface of the wound is very gritty and hyper granulated. More problematically the area on the  plantar left foot has increased depth indeed in the center of this it probes to bone. We have been using Hydrofera Blue in both areas. She is on antibiotics as prescribed by infectious disease IV vancomycin and oral Flagyl 3/25; the patient is in for total contact cast change. Our intake nurse was concerned about the amount of drainage which soaked right to the multiple layers we are putting on this. Wondered about options. The wound bed actually looks as healthy as this is looked although there may not be a lot of change in dimensions things are looking a lot better. If we are going to heal this woman's wound which is in the middle of her right Charcot foot this is going to need to be offloaded. There are not many alternatives 3/29; still not a lot of improvement although the surface of the right wound looks better. We have been using Hydrofera Blue 04/17/2019 upon evaluation today patient appears to be doing well with a total contact cast. With actually having to change this twice a week. She saw Dr. Dellia Nims for wound care earlier in the week this is for the cast change today. That is due to the fact that she has significant drainage. Overall the wound is been looking excellent however. 4/5; we continue to put a total contact cast on this patient with Hydrofera Blue. Still a lot of drainage which precludes a simple weekly change or changing this twice a week. 4/8; total contact cast changed today. Apparently the wound looks better per our intake nurse 4/12; patient here for wound care evaluation. Wounds on the right foot have not changed in dimension none about a month left foot looks similar. Apparently her antibiotics that we are giving her for osteomyelitis in the left foot complete on Wednesday. We have been using Hydrofera Blue on the right foot under a total contact cast. Nurses report greenish drainage although I think this may be discoloration from the [facility] 4/15; back for cast  change wounds look quite good although still moderate amount of drainage. T contact cast reapplied otal 4/19;. Wound evaluation. Wound on the right foot is smaller surface looks healthy. There is still the divot in the middle part of this wound but I could not feel any bone. Area on the left foot also looks better She has completed her antibiotics but still has the PICC line in. 4/23; patient comes back for a total contact cast change this was done in the standard fashion no issues 4/26; wound measures slightly larger on the right  foot which is disappointing. She sees Dr. Megan Salon with regards to her antibiotics on Wednesday. I believe she is on vancomycin IV and oral Flagyl. 4/29; in for her routine total contact cast change. She saw Dr. Megan Salon this morning. He is asking about antibiotic continuation I will be in touch with him. I did not look at her wound today 5/3; patient saw Dr. Megan Salon on 4/28. I did send him a note asking about the continuation of perhaps an oral regimen. I have not heard back. She still has an elevated sedimentation rate at 81. Her wounds are smaller. 5/6; in for her total contact cast change. We did not look at the wound today. She has seen Dr. Megan Salon and is now on oral Keflex and Flagyl. She seems to be tolerating these well 5/10; she had her PICC line removed. The wound on the right foot after some initial improvement has not made any improvement even with the total contact cast. Still having a lot of drainage. Likewise the area on the left foot really is not any better either 5/14; in for a cast change today. We will look at the right foot on Monday. If this is not making any progress I am going to try to cast the left foot. 5/17; the wound on the right foot is absolutely no different. I am going to put ointment on this area and change the cast of the left foot. We have been using silver alginate there 5/21; apparently the area on the right foot was better per our  intake nurse although I did not see it. We did a standard total contact cast change on the left foot we have been applying silver alginate to the wound here. 06/10/19-Area on the right foot per dimensions slightly better, the left foot has still some depth applying silver alginate to the wound with TCC 06/13/19-Patient here for Casting to Left foot 6/1; patient had the total contact cast were placed on the left foot. Noted some swelling in her right leg although the dimensions of the wound on the right foot are better. We have been using PolyMem on the right and silver alginate on the left under the cast 6/7; we continue to make nice progress in both her plantar feet wound. We are putting the total contact cast on the left. Surprisingly in spite of this the area on the right medial foot in the middle of her Charcot deformities actually is making progress as well 07/01/2019 upon evaluation today patient actually appears to be doing quite well with regard to her wounds on the feet compared to when I saw her last that has been quite some time. With that being said I do believe the total contact cast has been beneficial in this left foot and she has a lot of signs of improvement she does have some callus overhanging which is caused a little bit of a ledge and an issue here. I do believe that we may be able to do something to help in that regard. Fortunately otherwise the wound seems to be progressing quite nicely. 6/21; the patient's area on the left foot that we are currently casting is just about closed. The area on the right medial foot has a healthy looking surface but may be not changed as much from last week in terms of surface area. We are using silver alginate on the right and polymen Ag under the cast 6/28; only a small open area on the left foot remains. Using silver alginate  under a total contact cast. She has a diabetic shoe I have asked her to bring that for next week where this may be closed.  The area on the right foot with a Charcot deformity is also measuring slightly smaller. We are using PolyMem Ag here 7/6; left foot is totally closed. She does not require a total contact cast. She will graduate to her own diabetic shoe. The area on the right foot with a Charcot deformity measuring about the same. We have been using polymen under compression wraps. She did not do well in this area with a total contact cast 7/13; left foot remains intact. She is skillfully covering this area with foam and using her diabetic shoe. Slight area improvement of the right foot wound. Still rolled senescent looking edges. We have been using polymen 7/19; left foot remains intact. Wound measuring slightly smaller on the right foot in the middle of the Charcot deformity. We have been using PolyMem Ag 7/26; per the patient left foot remains intact. The wound on the right foot looks slightly smaller but dimensions are measuring the same. We have been using polymen Ag 8/9-Patient back after 2 weeks, wound of the right foot again looking slightly smaller, continuing to use PolyMem silver Electronic Signature(s) Signed: 08/26/2019 2:50:54 PM By: Cassandria Anger MD, MBA Entered By: Cassandria Anger on 08/26/2019 14:50:53 -------------------------------------------------------------------------------- Physical Exam Details Patient Name: Date of Service: [name], Mikena J. 08/26/2019 1:45 PM Medical Record Number: [id number] Patient Account Number: [id number] Date of Birth/Sex: Treating RN: March 06, 1955 (65 y.o. [name] Primary Care Provider: MA SO UDI, ELHA MA LSA DA T Other Clinician: Referring Provider: Treating Provider/Extender: Cassandria Anger MA SO UDI, ELHA MA LSA DA T Weeks in Treatment: 30 Constitutional alert and oriented x 3. sitting or standing blood pressure is within target range for patient.. supine blood pressure is within target range for patient.. pulse regular and within target range  for patient.Marland Kitchen respirations regular, non-labored and within target range for patient.Marland Kitchen temperature within target range for patient.. . . Well- nourished and well-hydrated in no acute distress. Notes Right foot plantar ulcer clean base, rim of callus tissue, no undermining Electronic Signature(s) Signed: 08/26/2019 2:51:19 PM By: Cassandria Anger MD, MBA Entered By: Cassandria Anger on 08/26/2019 14:51:19 -------------------------------------------------------------------------------- Physician Orders Details Patient Name: Date of Service: [name], Meilin J. 08/26/2019 1:45 PM Medical Record Number: [id number] Patient Account Number: [id number] Date of Birth/Sex: Treating RN: [birth year] (64 y.o. 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 Dressing Change Frequency Wound #9 Right,Medial Foot Do not change entire dressing for one week. Wound Cleansing Wound #9 Right,Medial Foot May shower with protection. - use cast protector Primary Wound Dressing Wound #9 Right,Medial Foot Polymem Silver Secondary Dressing Wound #9 Right,Medial Foot Foam - foam donut ABD pad Zetuvit or Kerramax Edema Control 3 Layer Compression System - Right Lower Extremity Avoid standing for long periods of time Elevate legs to the  level of the heart or above for 30 minutes daily and/or  when sitting, a frequency of: - throughout the day Exercise regularly Off-Loading Open toe surgical shoe to: - right foot Electronic Signature(s) Signed: 08/26/2019 4:24:36 PM By: Cassandria Anger MD, MBA Signed: 08/26/2019 5:19:36 PM By: Zandra Abts RN, BSN Entered By: Zandra Abts on 08/26/2019 14:50:43 -------------------------------------------------------------------------------- Problem List Details Patient Name: Date of Service: [name], Weslie J. 08/26/2019 1:45 PM Medical Record Number: [id number] Patient Account Number: [id number] Date of Birth/Sex: Treating RN: 1955/03/19 (64 y.o. [name] Primary Care Provider: MA SO UDI, Judene Companion MA LSA DA T Other Clinician: Referring Provider: Treating Provider/Extender: Cassandria Anger MA SO UDI, ELHA MA LSA DA T Weeks in Treatment: 30 Active Problems ICD-10 Encounter Code Description Active Date MDM Diagnosis E11.621 Type 2 diabetes mellitus with foot ulcer 01/28/2019 No Yes L97.511 Non-pressure chronic ulcer of other part of right foot limited to breakdown of 01/28/2019 No Yes skin E11.42 Type 2 diabetes mellitus with diabetic polyneuropathy 01/28/2019 No Yes M14.671 Charcot's joint, right ankle and foot 01/28/2019 No Yes M14.672 Charcot's joint, left ankle and foot 01/28/2019 No Yes 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, left ankle and foot 04/08/2019 No Yes Inactive Problems ICD-10 Code Description Active Date Inactive Date L97.524 Non-pressure chronic ulcer of other part of left foot with necrosis of bone 01/28/2019 01/28/2019 L97.514 Non-pressure chronic ulcer of other part of right foot with necrosis of bone 01/28/2019 01/28/2019 Resolved Problems Electronic Signature(s) Signed: 08/26/2019 4:24:36 PM By: Cassandria Anger MD, MBA Signed: 08/26/2019 5:19:36 PM By: Zandra Abts RN, BSN Entered By: Zandra Abts on 08/26/2019 14:49:47 -------------------------------------------------------------------------------- Progress Note  Details Patient Name: Date of Service: [name], Vidalia J. 08/26/2019 1:45 PM Medical Record Number: [id number] Patient Account Number: [id number] Date of Birth/Sex: Treating RN: 1955/06/20 (64 y.o. [name] Primary Care Provider: MA SO UDI, ELHA MA LSA DA T Other Clinician: Referring Provider: Treating Provider/Extender: Cassandria Anger MA SO UDI, ELHA MA LSA DA T Weeks in Treatment: 30 Subjective History of Present Illness (HPI) 64 yrs old bf here for follow up recurrent left charcot foot ulcer. She has DM. She is not a smoker. She states a blister developed 3 months ago at site of previous breakdown from 1 year ago. It was debrided at the podiatrist's office. She went to the ER and then sent here. She denies pain. Xray was negative for osteo. Culture from this clinic negative. 09/08/14 Referred by [name] to [name] for Charcot foot. Seen by him and MRI scheduled 09/19/14. Prior silver collagen but this was not ordered last visit, patient has been rinsing with saline alone. Re institute silver collagen every other day. F/u 2 weeks with [name] as Labor Day holiday. Supplies ordered. 09/25/14; this is a patient with a Charcot foot on the left. she has a wound over the plantar aspect of her left calcaneus. She has been seen by [name] of orthopedic surgery and has had an MRI. She is apparently going within the next week or 2 for corrective surgery which will involve an external fixator. I don't have any of the details of this. 10/06/14; The patient is a 64 yrs old bf with a left Charcot foot and ulcer on the plantar. 12/01/14 Returns post surgery with [name]. Has follow up visit later this week with him, currently in facility and NWB over this extremity. States no drainage from foot and no current wound care. On exam callus and scab in place, suspect healed. Has  external fixator in place. Will defer to [name] for management, ok to place moisturizing cream over scabs and  callus and she will call if she requires follow up here. READMISSION 01/28/2019 Patient was in the clinic in 2016 for a wound on the plantar aspect of her left calcaneus. Predominantly cared for by [name] she was referred to [name] and had corrective surgery for the position of her left ankle I believe. She had previously been here in 2015 and then prior to that in 2011 2012 that I do not have these records. More recently she has developed fairly extensive wounds on the right medial foot, left lateral foot and a small area on the right medial great toe. Right medial foot wound has been there for 6 to 7 months, left foot wound for 2 to 3 months and the right great toe only over the last few weeks. She has been followed by [name] at Dr. Laverta [place] office it sounds as though she has been undergoing callus paring and silver nitrate. The patient has been washing these off with soap and water and plan applying dry dressing Past medical history, type 2 diabetes well controlled with a recent hemoglobin A1c of 7 on 11/16, type 2 diabetes with peripheral neuropathy, previous left Charcot joint surgery bilateral Charcot joints currently, stage III chronic renal failure, hypertension, obstructive sleep apnea, history of MRSA and gastroesophageal reflux. 1/18; this is a patient with bilateral Charcot feet. Plain x-rays that I did of both of these wounds that are either at bone or precariously close to bone were done. On the right foot this showed possible lytic destruction involving the anterior aspect of the talus which may represent a wound osteomyelitis. An MRI was recommended. On the left there was no definite lytic destruction although the wound is deep undermining's and I think probably requires an MRI on this basis in any case 1/25; patient was supposed to have her MRI last Friday of her bilateral feet however they would not do it because there was silver alginate in her dressings,  will replace with calcium alginate today and will see if we can get her done today 2/1; patient did not get her MRIs done last week because of issues with the MRI department receiving orders to do bilateral feet. She has 2 deep wounds in the setting of Charcot feet deformities bilaterally. Both of them at one point had exposed bone although I had trouble demonstrating that today. I am not sure that we can offload these very aggressively as I watched her leave the clinic last week her gait is very narrow and unsteady. 2/15; the MRI of her right foot did not show osteomyelitis potentially osteonecrosis of the talus. 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 The wounds are a large wound on the right medial foot and on the left a substantial wound on the left lateral plantar calcaneus. We have been using silver alginate. Ultimately I think we need to consider a total contact cast on the right while we work through the possibility of osteomyelitis in the left. I watched her walk out with her crutches I have some trepidation about the ability to walk with the cast on her right foot. Nevertheless with her Charcot deformities I do not think there is a way to heal these short of offloading them aggressively. 2/22; the culture of the bone in the left foot that I did last week showed a few Citrobacter  Koseri as well as some streptococcal species. In my attempted bone for pathology did not actually yield a lot of bone the pathologist did not identify any osteomyelitis. 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 Is promised today and aggressive debridement of the right foot and a total contact cast which surprisingly she seemed to tolerate quite well 03/13/2019 upon evaluation today patient appears to be doing well with  her total contact cast she is here for the first cast change. She had no areas of rubbing or any other complications at this point. Overall things are doing quite well. 2/26; the patient was kindly seen by Dr. Orvan Falconer of infectious disease on 2/25. She is now on IV vancomycin PICC line placement in 2 days and oral Flagyl. This was in response to her MRI showing osteomyelitis of the left calcaneus concerning for osteomyelitis. We have been putting her right leg in a total contact cast. Our nurses report drainage. She is tolerating the total contact cast well. 3/4; the patient is started her IV antibiotics and is taking IV vancomycin and oral Flagyl. She appears to be tolerating these both well. This is for osteomyelitis involving the left calcaneus. The area on the right in the setting of bilateral Charcot deformities did not have any bone infection on MRI. We put a total contact cast on her with silver alginate as the primary dressing and she returns today in follow-up. Per our intake nurse there was too much drainage to leave this on all week therefore we will bring her back for an early change 3/8; follow-up total contact cast she is still having a lot of drainage probably too much drainage from the right foot wound will week with the same cast. She will be back on Thursday. The area on the left looks somewhat better 3/11; patient here for a total contact cast change. 3/15; patient came in for wound care evaluation. She is still on IV vancomycin and oral Flagyl for osteomyelitis in the left foot. The area on the right foot we are putting in a total contact cast. 3/18 for total contact cast change on the right foot 3/22; the patient was here for wound review today. The area on the right foot is slightly smaller in terms of surface area. However the surface of the wound is very gritty and hyper granulated. More problematically the area on the plantar left foot has increased depth indeed in the  center of this it probes to bone. We have been using Hydrofera Blue in both areas. She is on antibiotics as prescribed by infectious disease IV vancomycin and oral Flagyl 3/25; the patient is in for total contact cast change. Our intake nurse was concerned about the amount of drainage which soaked right to the multiple layers we are putting on this. Wondered about options. The wound bed actually looks as healthy as this is looked although there may not be a lot of change in dimensions things are looking a lot better. If we are going to heal this woman's wound which is in the middle of her right Charcot foot this is going to need to be offloaded. There are not many alternatives 3/29; still not a lot of improvement although the surface of the right wound looks better. We have been using Hydrofera Blue 04/17/2019 upon evaluation today patient appears to be doing  well with a total contact cast. With actually having to change this twice a week. She saw [name] for wound care earlier in the week this is for the cast change today. That is due to the fact that she has significant drainage. Overall the wound is been looking excellent however. 4/5; we continue to put a total contact cast on this patient with Hydrofera Blue. Still a lot of drainage which precludes a simple weekly change or changing this twice a week. 4/8; total contact cast changed today. Apparently the wound looks better per our intake nurse 4/12; patient here for wound care evaluation. Wounds on the right foot have not changed in dimension none about a month left foot looks similar. Apparently her antibiotics that we are giving her for osteomyelitis in the left foot complete on Wednesday. We have been using Hydrofera Blue on the right foot under a total contact cast. Nurses report greenish drainage although I think this may be discoloration from the [facility] 4/15; back for cast change wounds look quite good although still moderate  amount of drainage. T contact cast reapplied otal 4/19;. Wound evaluation. Wound on the right foot is smaller surface looks healthy. There is still the divot in the middle part of this wound but I could not feel any bone. Area on the left foot also looks better She has completed her antibiotics but still has the PICC line in. 4/23; patient comes back for a total contact cast change this was done in the standard fashion no issues 4/26; wound measures slightly larger on the right foot which is disappointing. She sees Dr. Orvan Falconer with regards to her antibiotics on Wednesday. I believe she is on vancomycin IV and oral Flagyl. 4/29; in for her routine total contact cast change. She saw Dr. Orvan Falconer this morning. He is asking about antibiotic continuation I will be in touch with him. I did not look at her wound today 5/3; patient saw Dr. Orvan Falconer on 4/28. I did send him a note asking about the continuation of perhaps an oral regimen. I have not heard back. She still has an elevated sedimentation rate at 81. Her wounds are smaller. 5/6; in for her total contact cast change. We did not look at the wound today. She has seen Dr. Orvan Falconer and is now on oral Keflex and Flagyl. She seems to be tolerating these well 5/10; she had her PICC line removed. The wound on the right foot after some initial improvement has not made any improvement even with the total contact cast. Still having a lot of drainage. Likewise the area on the left foot really is not any better either 5/14; in for a cast change today. We will look at the right foot on Monday. If this is not making any progress I am going to try to cast the left foot. 5/17; the wound on the right foot is absolutely no different. I am going to put ointment on this area and change the cast of the left foot. We have been using silver alginate there 5/21; apparently the area on the right foot was better per our intake nurse although I did not see it. We did a  standard total contact cast change on the left foot we have been applying silver alginate to the wound here. 06/10/19-Area on the right foot per dimensions slightly better, the left foot has still some depth applying silver alginate to the wound with TCC 06/13/19-Patient here for Casting to Left foot 6/1; patient had  the total contact cast were placed on the left foot. Noted some swelling in her right leg although the dimensions of the wound on the right foot are better. We have been using PolyMem on the right and silver alginate on the left under the cast 6/7; we continue to make nice progress in both her plantar feet wound. We are putting the total contact cast on the left. Surprisingly in spite of this the area on the right medial foot in the middle of her Charcot deformities actually is making progress as well 07/01/2019 upon evaluation today patient actually appears to be doing quite well with regard to her wounds on the feet compared to when I saw her last that has been quite some time. With that being said I do believe the total contact cast has been beneficial in this left foot and she has a lot of signs of improvement she does have some callus overhanging which is caused a little bit of a ledge and an issue here. I do believe that we may be able to do something to help in that regard. Fortunately otherwise the wound seems to be progressing quite nicely. 6/21; the patient's area on the left foot that we are currently casting is just about closed. The area on the right medial foot has a healthy looking surface but may be not changed as much from last week in terms of surface area. We are using silver alginate on the right and polymen Ag under the cast 6/28; only a small open area on the left foot remains. Using silver alginate under a total contact cast. She has a diabetic shoe I have asked her to bring that for next week where this may be closed. The area on the right foot with a Charcot  deformity is also measuring slightly smaller. We are using PolyMem Ag here 7/6; left foot is totally closed. She does not require a total contact cast. She will graduate to her own diabetic shoe. ooThe area on the right foot with a Charcot deformity measuring about the same. We have been using polymen under compression wraps. She did not do well in this area with a total contact cast 7/13; left foot remains intact. She is skillfully covering this area with foam and using her diabetic shoe. Slight area improvement of the right foot wound. Still rolled senescent looking edges. We have been using polymen 7/19; left foot remains intact. Wound measuring slightly smaller on the right foot in the middle of the Charcot deformity. We have been using PolyMem Ag 7/26; per the patient left foot remains intact. The wound on the right foot looks slightly smaller but dimensions are measuring the same. We have been using polymen Ag 8/9-Patient back after 2 weeks, wound of the right foot again looking slightly smaller, continuing to use PolyMem silver Objective Constitutional alert and oriented x 3. sitting or standing blood pressure is within target range for patient.. supine blood pressure is within target range for patient.. pulse regular and within target range for patient.Marland Kitchen respirations regular, non-labored and within target range for patient.Marland Kitchen temperature within target range for patient.. Well- nourished and well-hydrated in no acute distress. Vitals Time Taken: 2:09 PM, Height: 68 in, Weight: 265 lbs, BMI: 40.3, Temperature: 97.9 F, Pulse: 62 bpm, Respiratory Rate: 18 breaths/min, Blood Pressure: 121/72 mmHg. General Notes: Right foot plantar ulcer clean base, rim of callus tissue, no undermining Integumentary (Hair, Skin) Wound #9 status is Open. Original cause of wound was Blister. The wound  is located on the Right,Medial Foot. The wound measures 1.7cm length x 1.8cm width x 0.4cm depth; 2.403cm^2  area and 0.961cm^3 volume. There is Fat Layer (Subcutaneous Tissue) Exposed exposed. There is no tunneling or undermining noted. There is a large amount of serosanguineous drainage noted. The wound margin is well defined and not attached to the wound base. There is large (67- 100%) red, pink granulation within the wound bed. There is no necrotic tissue within the wound bed. 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 There was a Three Layer Compression Therapy Procedure by Zandra Abts, RN. Post procedure Diagnosis Wound #9: Same as Pre-Procedure Plan Follow-up Appointments: Return Appointment in 1 week. Dressing Change Frequency: Wound #9 Right,Medial Foot: Do not change entire dressing for one week. Wound Cleansing: Wound #9 Right,Medial Foot: May shower with protection. - use cast protector Primary Wound Dressing: Wound #9 Right,Medial Foot: Polymem Silver Secondary Dressing: Wound #9 Right,Medial Foot: Foam - foam donut ABD pad Zetuvit or Kerramax Edema Control: 3 Layer Compression System - Right Lower Extremity Avoid standing for long periods of time Elevate legs to the level of the heart or above for 30 minutes daily and/or when sitting, a frequency of: - throughout the day Exercise regularly Off-Loading: Open toe surgical shoe to: - right foot -Continue with PolyMem silver to the right foot wound with 3 layer compression -Patient offloading with surgical shoe and staying off her feet as much as possible -Return to clinic next  week Electronic Signature(s) Signed: 08/26/2019 2:51:52 PM By: Cassandria Anger MD, MBA Entered By: Cassandria Anger on 08/26/2019 14:51:51 -------------------------------------------------------------------------------- SuperBill Details Patient Name: Date of Service: [name], Gilda J. 08/26/2019 Medical Record Number: [id number] Patient Account Number: [id number] Date of Birth/Sex: Treating RN: [id number] (64 y.o. [name] Primary Care Provider: MA SO UDI, Tehaleh MA LSA DA T Other Clinician: Referring Provider: Treating Provider/Extender: Cassandria Anger MA SO UDI, ELHA MA LSA DA T Weeks in Treatment: 30 Diagnosis Coding ICD-10 Codes Code Description E11.621 Type 2 diabetes mellitus with foot ulcer L97.511 Non-pressure chronic ulcer of other part of right foot limited to breakdown of skin E11.42 Type 2 diabetes mellitus with diabetic polyneuropathy M14.671 Charcot's joint, right ankle and foot M14.672 Charcot's joint, left ankle and foot 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, left ankle and foot Facility Procedures CPT4 Code: [id number] Description: (Facility Use Only) [phone] - APPLY MULTLAY COMPRS LWR RT LEG Modifier: Quantity: 1 Physician Procedures : CPT4 Code Description Modifier [id number] - WC PHYS LEVEL 3 - EST PT ICD-10 Diagnosis Description E11.621 Type 2 diabetes mellitus with foot ulcer Quantity: 1 Electronic Signature(s) Signed: 08/26/2019 4:24:36 PM By: Cassandria Anger MD, MBA Signed: 08/26/2019 5:16:06 PM By: [name] Previous Signature: 08/26/2019 2:52:04 PM Version By: Cassandria Anger MD, MBA Entered By: [name] on 08/26/2019 16:17:58

## 2019-08-27 NOTE — Telephone Encounter (Signed)
Opened in Error.

## 2019-08-27 NOTE — Progress Notes (Signed)
[name], [name] ([id number]) Visit Report for 08/26/2019 Arrival Information Details Patient Name: Date of Service: [name], [name] 08/26/2019 1:45 PM Medical Record Number: [id number] Patient Account Number: [id number] Date of Birth/Sex: Treating RN: July 19, 1955 (64 y.o. [name] Primary Care Devontaye Ground: MA SO UDI, ELHA MA LSA DA T Other Clinician: Referring Dyesha Henault: Treating Delron Comer/Extender: Cassandria Anger MA SO UDI, ELHA MA LSA DA T Weeks in Treatment: 30 Visit Information History Since Last Visit Added or deleted any medications: No Patient Arrived: Crutches Any new allergies or adverse reactions: No Arrival Time: 14:09 Had a fall or experienced change in No Accompanied By: friend activities of daily living that may affect Transfer Assistance: None risk of falls: Patient Identification Verified: Yes Signs or symptoms of abuse/neglect since last visito No Secondary Verification Process Completed: Yes Hospitalized since last visit: No Patient Requires Transmission-Based Precautions: No Implantable device outside of the clinic excluding No Patient Has Alerts: No cellular tissue based products placed in the [name] since last visit: Has Dressing in Place as Prescribed: Yes Pain Present Now: No Electronic Signature(s) Signed: 08/27/2019 10:17:44 AM By: Karl Ito Entered By: Karl Ito on 08/26/2019 14:09:48 -------------------------------------------------------------------------------- Compression Therapy Details Patient Name: Date of Service: [name], [name] 08/26/2019 1:45 PM Medical Record Number: [id number] Patient Account Number: [id number] Date of Birth/Sex: Treating RN: Oct 28, 1955 (64 y.o. [name] Primary Care Wilbert Hayashi: MA SO UDI, Judene Companion MA LSA DA T Other Clinician: Referring Lisbet Busker: Treating Magda Muise/Extender: Cassandria Anger MA SO UDI, ELHA MA LSA DA T Weeks in Treatment: 30 Compression Therapy Performed for Wound Assessment:  Wound #9 Right,Medial Foot Performed By: Clinician Zandra Abts, RN Compression Type: Three Layer Post Procedure Diagnosis Same as Pre-procedure Electronic Signature(s) Signed: 08/26/2019 5:19:36 PM By: Zandra Abts RN, BSN Entered By: Zandra Abts on 08/26/2019 14:50:03 -------------------------------------------------------------------------------- Encounter Discharge Information Details Patient Name: Date of Service: [name], [name] 08/26/2019 1:45 PM Medical Record Number: [id number] Patient Account Number: [id number] Date of Birth/Sex: Treating RN: 11-Feb-1955 (64 y.o. [name] Primary Care Fortune Brannigan: MA SO UDI, [place] MA LSA DA T Other Clinician: Referring Eadie Repetto: Treating Adeleine Pask/Extender: Cassandria Anger MA SO UDI, ELHA MA LSA DA T Weeks in Treatment: 30 Encounter Discharge Information Items Discharge Condition: Stable Ambulatory Status: Crutches Discharge Destination: Home Transportation: Private Auto Accompanied By: self Schedule Follow-up Appointment: Yes Clinical Summary of Care: Patient Declined Electronic Signature(s) Signed: 08/26/2019 4:51:12 PM By: Zenaida Deed RN, BSN Entered By: Zenaida Deed on 08/26/2019 15:12:37 -------------------------------------------------------------------------------- Lower Extremity Assessment Details Patient Name: Date of Service: [name], [name] 08/26/2019 1:45 PM Medical Record Number: [id number] Patient Account Number: [id number] Date of Birth/Sex: Treating RN: [birth year] (64 y.o. [name] Primary Care Winfrey Chillemi: MA SO UDI, [place] MA LSA DA T Other Clinician: Referring Aanika Defoor: Treating Quanita Barona/Extender: Cassandria Anger MA SO UDI, ELHA MA LSA DA T Weeks in Treatment: 30 Edema Assessment Assessed: [Left: No] [Right: No] Edema: [Left: Ye] [Right: s] Calf Left: Right: Point of Measurement: 43 cm From Medial Instep cm 35 cm Ankle Left: Right: Point of Measurement: 12 cm From Medial Instep  cm 24.5 cm Electronic Signature(s) Signed: 08/27/2019 5:05:11 PM By: Yevonne Pax RN Entered By: Yevonne Pax on 08/26/2019 14:32:24 -------------------------------------------------------------------------------- Multi-Disciplinary Care Plan Details Patient Name: Date of Service: [name], [name] 08/26/2019 1:45 PM Medical Record Number: [id number] Patient Account Number: [id number] Date of Birth/Sex: Treating RN: 1955/01/26 (64 y.o. F) [name] Primary Care Yerick Eggebrecht: MA SO UDI, ELHA MA LSA DA T Other Clinician: Referring  Nayan Proch: Treating Yohanna Tow/Extender: Cassandria Anger MA SO UDI, ELHA MA LSA DA T Weeks in Treatment: 30 Active Inactive Wound/Skin Impairment Nursing Diagnoses: Impaired tissue integrity Knowledge deficit related to ulceration/compromised skin integrity Goals: Patient/caregiver will verbalize understanding of skin care regimen Date Initiated: 01/28/2019 Target Resolution Date: 09/06/2019 Goal Status: Active Interventions: Assess patient/caregiver ability to obtain necessary supplies Assess patient/caregiver ability to perform ulcer/skin care regimen upon admission and as needed Assess ulceration(s) every visit Provide education on ulcer and skin care Notes: Electronic Signature(s) Signed: 08/26/2019 5:16:06 PM By: [name] Entered By: [name] on 08/26/2019 16:17:02 -------------------------------------------------------------------------------- Pain Assessment Details Patient Name: Date of Service: [name], [name] 08/26/2019 1:45 PM Medical Record Number: [id number] Patient Account Number: [id number] Date of Birth/Sex: Treating RN: 1955-08-30 (64 y.o. [name] Primary Care Gerlean Cid: MA SO UDI, Judene Companion MA LSA DA T Other Clinician: Referring Janila Arrazola: Treating Osmin Welz/Extender: Cassandria Anger MA SO UDI, ELHA MA LSA DA T Weeks in Treatment: 30 Active Problems Location of Pain Severity and Description of  Pain Patient Has Paino No Site Locations Pain Management and Medication Current Pain Management: Electronic Signature(s) Signed: 08/26/2019 5:19:36 PM By: Zandra Abts RN, BSN Signed: 08/27/2019 10:17:44 AM By: Karl Ito Signed: 08/27/2019 10:17:44 AM By: Karl Ito Entered By: Karl Ito on 08/26/2019 14:10:13 -------------------------------------------------------------------------------- Patient/Caregiver Education Details Patient Name: Date of Service: [name] 8/9/2021andnbsp1:45 PM Medical Record Number: [id number] Patient Account Number: [id number] Date of Birth/Gender: Treating RN: 1955/09/30 (65 y.o. F) [name] Primary Care Physician: MA SO UDI, ELHA MA LSA DA T Other Clinician: Referring Physician: Treating Physician/Extender: Cassandria Anger MA SO UDI, ELHA MA LSA DA T Weeks in Treatment: 30 Education Assessment Education Provided To: Patient Education Topics Provided Wound/Skin Impairment: Handouts: Caring for Your Ulcer Methods: Explain/Verbal Responses: State content correctly Electronic Signature(s) Signed: 08/26/2019 5:16:06 PM By: [name] Entered By: [name] on 08/26/2019 16:17:19 -------------------------------------------------------------------------------- Wound Assessment Details Patient Name: Date of Service: [name], [name] 08/26/2019 1:45 PM Medical Record Number: [id number] Patient Account Number: [id number] Date of Birth/Sex: Treating RN: 12-Sep-1955 (64 y.o. [name] Primary Care Braelyn Jenson: MA SO UDI, ELHA MA LSA DA T Other Clinician: Referring Teandra Harlan: Treating Jamira Barfuss/Extender: Cassandria Anger MA SO UDI, ELHA MA LSA DA T Weeks in Treatment: 30 Wound Status Wound Number: 9 Primary Diabetic Wound/Ulcer of the Lower Extremity Etiology: Wound Location: Right, Medial Foot Wound Open Wounding Event: Blister Status: Date Acquired: 06/18/2018 Comorbid Glaucoma, Chronic sinus  problems/congestion, Anemia, Sleep Weeks Of Treatment: 30 History: Apnea, Hypertension, Type II Diabetes, Rheumatoid Arthritis, Clustered Wound: No Neuropathy Photos Photo Uploaded By: Benjaman Kindler on 08/27/2019 10:44:41 Wound Measurements Length: (cm) 1.7 Width: (cm) 1.8 Depth: (cm) 0.4 Area: (cm) 2.403 Volume: (cm) 0.961 % Reduction in Area: 78.1% % Reduction in Volume: 90.3% Epithelialization: Small (1-33%) Tunneling: No Undermining: No Wound Description Classification: Grade 2 Wound Margin: Well defined, not attached Exudate Amount: Large Exudate Type: Serosanguineous Exudate Color: red, brown Foul Odor After Cleansing: No Slough/Fibrino No Wound Bed Granulation Amount: Large (67-100%) Exposed Structure Granulation Quality: Red, Pink Fascia Exposed: No Necrotic Amount: None Present (0%) Fat Layer (Subcutaneous Tissue) Exposed: Yes Tendon Exposed: No Muscle Exposed: No Joint Exposed: No Bone Exposed: No Treatment Notes Wound #9 (Right, Medial Foot) 3. Primary Dressing Applied Polymem Ag 4. Secondary Dressing ABD Pad Dry Gauze Foam Other secondary dressing (specify in notes) Notes zetuvit. stockinette Electronic Signature(s) Signed: 08/26/2019 5:19:36 PM By: Zandra Abts RN, BSN Signed: 08/27/2019 5:05:11 PM By: Yevonne Pax  RN Entered By: Yevonne Pax on 08/26/2019 14:32:46 -------------------------------------------------------------------------------- Vitals Details Patient Name: Date of Service: Sarah [name]. 08/26/2019 1:45 PM Medical Record Number: [id number] Patient Account Number: [id number] Date of Birth/Sex: Treating RN: 08-15-1955 (64 y.o. [name] Primary Care Gurdeep Keesey: MA SO UDI, ELHA MA LSA DA T Other Clinician: Referring Bridie Colquhoun: Treating Keano Guggenheim/Extender: Cassandria Anger MA SO UDI, ELHA MA LSA DA T Weeks in Treatment: 30 Vital Signs Time Taken: 14:09 Temperature (F): 97.9 Height (in): 68 Pulse (bpm): 62 Weight  (lbs): 265 Respiratory Rate (breaths/min): 18 Body Mass Index (BMI): 40.3 Blood Pressure (mmHg): 121/72 Reference Range: 80 - 120 mg / dl Electronic Signature(s) Signed: 08/27/2019 10:17:44 AM By: Karl Ito Entered By: Karl Ito on 08/26/2019 14:10:04

## 2019-09-02 ENCOUNTER — Encounter (HOSPITAL_BASED_OUTPATIENT_CLINIC_OR_DEPARTMENT_OTHER): Payer: Medicare Other | Admitting: Internal Medicine

## 2019-09-02 DIAGNOSIS — E11621 Type 2 diabetes mellitus with foot ulcer: Secondary | ICD-10-CM | POA: Diagnosis not present

## 2019-09-02 NOTE — Progress Notes (Signed)
RYELEE, [name] ([id number]) Visit Report for 09/02/2019 Debridement Details Patient Name: Date of Service: [name], [name] 09/02/2019 1:00 PM Medical Record Number: [id number] Patient Account Number: [id number] Date of Birth/Sex: Treating RN: [birth year] (64 y.o. [name] Primary Care Provider: MA SO UDI, Dudley MA LSA DA T Other Clinician: Referring Provider: Treating Provider/Extender: Baltazar Najjar MA SO UDI, ELHA MA LSA DA T Weeks in Treatment: 31 Debridement Performed for Assessment: Wound #9 Right,Medial Foot Performed By: Physician [name], MD Debridement Type: Debridement Severity of Tissue Pre Debridement: Fat layer exposed Level of Consciousness (Pre-procedure): Awake and Alert Pre-procedure Verification/Time Out Yes - 14:18 Taken: Start Time: 14:18 T Area Debrided (L x W): otal 1.7 (cm) x 1.5 (cm) = 2.55 (cm) Tissue and other material debrided: Non-Viable, Callus Level: Non-Viable Tissue Debridement Description: Selective/Open Wound Instrument: Curette Bleeding: Minimum Hemostasis Achieved: Pressure End Time: 14:20 Procedural Pain: 0 Post Procedural Pain: 0 Response to Treatment: Procedure was tolerated well Level of Consciousness (Post- Awake and Alert procedure): Post Debridement Measurements of Total Wound Length: (cm) 1.7 Width: (cm) 1.5 Depth: (cm) 0.4 Volume: (cm) 0.801 Character of Wound/Ulcer Post Debridement: Improved Severity of Tissue Post Debridement: Fat layer exposed Post Procedure Diagnosis Same as Pre-procedure Electronic Signature(s) Signed: 09/02/2019 5:14:32 PM By: Zandra Abts RN, BSN Signed: 09/02/2019 5:53:04 PM By: Baltazar Najjar MD Entered By: Baltazar Najjar on 09/02/2019 14:22:49 -------------------------------------------------------------------------------- HPI Details Patient Name: Date of Service: [name], [name] 09/02/2019 1:00 PM Medical Record Number: [id number] Patient Account Number:  [id number] Date of Birth/Sex: Treating RN: 02/21/1955 (65 y.o. [name] Primary Care Provider: MA SO UDI, Kemp MA LSA DA T Other Clinician: Referring Provider: Treating Provider/Extender: Baltazar Najjar MA SO UDI, ELHA MA LSA DA T Weeks in Treatment: 31 History of Present Illness HPI Description: 64 yrs old bf here for follow up recurrent left charcot foot ulcer. She has DM. She is not a smoker. She states a blister developed 3 months ago at site of previous breakdown from 1 year ago. It was debrided at the podiatrist's office. She went to the ER and then sent here. She denies pain. Xray was negative for osteo. Culture from this clinic negative. 09/08/[name] to [name] for Charcot foot. Seen by him and MRI scheduled 09/19/14. Prior silver collagen but this was not ordered last visit, patient has been rinsing with saline alone. Re institute silver collagen every other day. F/u 2 weeks with [name] as Labor Day holiday. Supplies ordered. 09/25/14; this is a patient with a Charcot foot on the left. she has a wound over the plantar aspect of her left calcaneus. She has been seen by [name] of orthopedic surgery and has had an MRI. She is apparently going within the next week or 2 for corrective surgery which will involve an external fixator. I don't have any of the details of this. 10/06/14; The patient is a 64 yrs old bf with a left Charcot foot and ulcer on the plantar. 12/01/14 Returns post surgery with [name]. Has follow up visit later this week with him, currently in facility and NWB over this extremity. States no drainage from foot and no current wound care. On exam callus and scab in place, suspect healed. Has external fixator in place. Will defer to [name] for management, ok to place moisturizing cream over scabs and callus and she will call if she requires follow up here. READMISSION 01/28/2019 Patient was in the clinic  in 2016 for a wound on the  plantar aspect of her left calcaneus. Predominantly cared for by [name] she was referred to [name] and had corrective surgery for the position of her left ankle I believe. She had previously been here in 2015 and then prior to that in 2011 2012 that I do not have these records. More recently she has developed fairly extensive wounds on the right medial foot, left lateral foot and a small area on the right medial great toe. Right medial foot wound has been there for 6 to 7 months, left foot wound for 2 to 3 months and the right great toe only over the last few weeks. She has been followed by [name] at Dr. Laverta [place] office it sounds as though she has been undergoing callus paring and silver nitrate. The patient has been washing these off with soap and water and plan applying dry dressing Past medical history, type 2 diabetes well controlled with a recent hemoglobin A1c of 7 on 11/16, type 2 diabetes with peripheral neuropathy, previous left Charcot joint surgery bilateral Charcot joints currently, stage III chronic renal failure, hypertension, obstructive sleep apnea, history of MRSA and gastroesophageal reflux. 1/18; this is a patient with bilateral Charcot feet. Plain x-rays that I did of both of these wounds that are either at bone or precariously close to bone were done. On the right foot this showed possible lytic destruction involving the anterior aspect of the talus which may represent a wound osteomyelitis. An MRI was recommended. On the left there was no definite lytic destruction although the wound is deep undermining's and I think probably requires an MRI on this basis in any case 1/25; patient was supposed to have her MRI last Friday of her bilateral feet however they would not do it because there was silver alginate in her dressings, will replace with calcium alginate today and will see if we can get her done today 2/1; patient did not get her MRIs done last week because of  issues with the MRI department receiving orders to do bilateral feet. She has 2 deep wounds in the setting of Charcot feet deformities bilaterally. Both of them at one point had exposed bone although I had trouble demonstrating that today. I am not sure that we can offload these very aggressively as I watched her leave the clinic last week her gait is very narrow and unsteady. 2/15; the MRI of her right foot did not show osteomyelitis potentially osteonecrosis of the talus. 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 The wounds are a large wound on the right medial foot and on the left a substantial wound on the left lateral plantar calcaneus. We have been using silver alginate. Ultimately I think we need to consider a total contact cast on the right while we work through the possibility of osteomyelitis in the left. I watched her walk out with her crutches I have some trepidation about the ability to walk with the cast on her right foot. Nevertheless with her Charcot deformities I do not think there is a way to heal these short of offloading them aggressively. 2/22; the culture of the bone in the left foot that I did last week showed a few Citrobacter Koseri as well as some streptococcal species. In my attempted bone for pathology did not actually yield a lot of bone the pathologist did not identify any osteomyelitis. Nevertheless based on the MRI, bone for culture and  the probing wound into the bone itself I think this woman has osteomyelitis in the left foot and we will refer her to infectious disease. Is promised today and aggressive debridement of the right foot and a total contact cast which surprisingly she seemed to tolerate quite well 03/13/2019 upon evaluation today patient appears to be doing well with her total contact cast she is here for the first cast change. She had no areas of rubbing or any other complications at this point. Overall things  are doing quite well. 2/26; the patient was kindly seen by Dr. Orvan Falconer of infectious disease on 2/25. She is now on IV vancomycin PICC line placement in 2 days and oral Flagyl. This was in response to her MRI showing osteomyelitis of the left calcaneus concerning for osteomyelitis. We have been putting her right leg in a total contact cast. Our nurses report drainage. She is tolerating the total contact cast well. 3/4; the patient is started her IV antibiotics and is taking IV vancomycin and oral Flagyl. She appears to be tolerating these both well. This is for osteomyelitis involving the left calcaneus. The area on the right in the setting of bilateral Charcot deformities did not have any bone infection on MRI. We put a total contact cast on her with silver alginate as the primary dressing and she returns today in follow-up. Per our intake nurse there was too much drainage to leave this on all week therefore we will bring her back for an early change 3/8; follow-up total contact cast she is still having a lot of drainage probably too much drainage from the right foot wound will week with the same cast. She will be back on Thursday. The area on the left looks somewhat better 3/11; patient here for a total contact cast change. 3/15; patient came in for wound care evaluation. She is still on IV vancomycin and oral Flagyl for osteomyelitis in the left foot. The area on the right foot we are putting in a total contact cast. 3/18 for total contact cast change on the right foot 3/22; the patient was here for wound review today. The area on the right foot is slightly smaller in terms of surface area. However the surface of the wound is very gritty and hyper granulated. More problematically the area on the plantar left foot has increased depth indeed in the center of this it probes to bone. We have been using Hydrofera Blue in both areas. She is on antibiotics as prescribed by infectious disease IV  vancomycin and oral Flagyl 3/25; the patient is in for total contact cast change. Our intake nurse was concerned about the amount of drainage which soaked right to the multiple layers we are putting on this. Wondered about options. The wound bed actually looks as healthy as this is looked although there may not be a lot of change in dimensions things are looking a lot better. If we are going to heal this woman's wound which is in the middle of her right Charcot foot this is going to need to be offloaded. There are not many alternatives 3/29; still not a lot of improvement although the surface of the right wound looks better. We have been using Hydrofera Blue 04/17/2019 upon evaluation today patient appears to be doing well with a total contact cast. With actually having to change this twice a week. She saw [name] for wound care earlier in the week this is for the cast change today. That is due to  the fact that she has significant drainage. Overall the wound is been looking excellent however. 4/5; we continue to put a total contact cast on this patient with Hydrofera Blue. Still a lot of drainage which precludes a simple weekly change or changing this twice a week. 4/8; total contact cast changed today. Apparently the wound looks better per our intake nurse 4/12; patient here for wound care evaluation. Wounds on the right foot have not changed in dimension none about a month left foot looks similar. Apparently her antibiotics that we are giving her for osteomyelitis in the left foot complete on Wednesday. We have been using Hydrofera Blue on the right foot under a total contact cast. Nurses report greenish drainage although I think this may be discoloration from the [facility] 4/15; back for cast change wounds look quite good although still moderate amount of drainage. T contact cast reapplied otal 4/19;. Wound evaluation. Wound on the right foot is smaller surface looks healthy. There is  still the divot in the middle part of this wound but I could not feel any bone. Area on the left foot also looks better She has completed her antibiotics but still has the PICC line in. 4/23; patient comes back for a total contact cast change this was done in the standard fashion no issues 4/26; wound measures slightly larger on the right foot which is disappointing. She sees Dr. Orvan Falconer with regards to her antibiotics on Wednesday. I believe she is on vancomycin IV and oral Flagyl. 4/29; in for her routine total contact cast change. She saw Dr. Orvan Falconer this morning. He is asking about antibiotic continuation I will be in touch with him. I did not look at her wound today 5/3; patient saw Dr. Orvan Falconer on 4/28. I did send him a note asking about the continuation of perhaps an oral regimen. I have not heard back. She still has an elevated sedimentation rate at 81. Her wounds are smaller. 5/6; in for her total contact cast change. We did not look at the wound today. She has seen Dr. Orvan Falconer and is now on oral Keflex and Flagyl. She seems to be tolerating these well 5/10; she had her PICC line removed. The wound on the right foot after some initial improvement has not made any improvement even with the total contact cast. Still having a lot of drainage. Likewise the area on the left foot really is not any better either 5/14; in for a cast change today. We will look at the right foot on Monday. If this is not making any progress I am going to try to cast the left foot. 5/17; the wound on the right foot is absolutely no different. I am going to put ointment on this area and change the cast of the left foot. We have been using silver alginate there 5/21; apparently the area on the right foot was better per our intake nurse although I did not see it. We did a standard total contact cast change on the left foot we have been applying silver alginate to the wound here. 06/10/19-Area on the right foot per  dimensions slightly better, the left foot has still some depth applying silver alginate to the wound with TCC 06/13/19-Patient here for Casting to Left foot 6/1; patient had the total contact cast were placed on the left foot. Noted some swelling in her right leg although the dimensions of the wound on the right foot are better. We have been using PolyMem on the right  and silver alginate on the left under the cast 6/7; we continue to make nice progress in both her plantar feet wound. We are putting the total contact cast on the left. Surprisingly in spite of this the area on the right medial foot in the middle of her Charcot deformities actually is making progress as well 07/01/2019 upon evaluation today patient actually appears to be doing quite well with regard to her wounds on the feet compared to when I saw her last that has been quite some time. With that being said I do believe the total contact cast has been beneficial in this left foot and she has a lot of signs of improvement she does have some callus overhanging which is caused a little bit of a ledge and an issue here. I do believe that we may be able to do something to help in that regard. Fortunately otherwise the wound seems to be progressing quite nicely. 6/21; the patient's area on the left foot that we are currently casting is just about closed. The area on the right medial foot has a healthy looking surface but may be not changed as much from last week in terms of surface area. We are using silver alginate on the right and polymen Ag under the cast 6/28; only a small open area on the left foot remains. Using silver alginate under a total contact cast. She has a diabetic shoe I have asked her to bring that for next week where this may be closed. The area on the right foot with a Charcot deformity is also measuring slightly smaller. We are using PolyMem Ag here 7/6; left foot is totally closed. She does not require a total contact cast.  She will graduate to her own diabetic shoe. The area on the right foot with a Charcot deformity measuring about the same. We have been using polymen under compression wraps. She did not do well in this area with a total contact cast 7/13; left foot remains intact. She is skillfully covering this area with foam and using her diabetic shoe. Slight area improvement of the right foot wound. Still rolled senescent looking edges. We have been using polymen 7/19; left foot remains intact. Wound measuring slightly smaller on the right foot in the middle of the Charcot deformity. We have been using PolyMem Ag 7/26; per the patient left foot remains intact. The wound on the right foot looks slightly smaller but dimensions are measuring the same. We have been using polymen Ag 8/9-Patient back after 2 weeks, wound of the right foot again looking slightly smaller, continuing to use PolyMem silver 8/16; patient has made some minor improvements in wound area on the right foot. Using polymen Ag. The left foot remains healed Electronic Signature(s) Signed: 09/02/2019 5:53:04 PM By: Baltazar Najjar MD Entered By: Baltazar Najjar on 09/02/2019 14:24:40 -------------------------------------------------------------------------------- Physical Exam Details Patient Name: Date of Service: [name], [name] 09/02/2019 1:00 PM Medical Record Number: [id number] Patient Account Number: [id number] Date of Birth/Sex: Treating RN: Feb 03, 1955 (64 y.o. [name] Primary Care Provider: MA SO UDI, [place] MA LSA DA T Other Clinician: Referring Provider: Treating Provider/Extender: Baltazar Najjar MA SO UDI, ELHA MA LSA DA T Weeks in Treatment: 31 Constitutional Sitting or standing Blood Pressure is within target range for patient.. Pulse regular and within target range for patient.Marland Kitchen Respirations regular, non-labored and within target range.. Temperature is normal and within the target range for the patient.Marland Kitchen Appears in  no distress. Respiratory work of breathing  is normal. Cardiovascular Pedal pulses are palpable. Notes 8/16; right foot plantar clean base. Rim of callus and thick skin. Debrided with a #5 curette to a better looking circumference. Electronic Signature(s) Signed: 09/02/2019 5:53:04 PM By: Baltazar Najjar MD Entered By: Baltazar Najjar on 09/02/2019 14:25:56 -------------------------------------------------------------------------------- Physician Orders Details Patient Name: Date of Service: [facility], [name] 09/02/2019 1:00 PM Medical Record Number: [id number] Patient Account Number: [id number] Date of Birth/Sex: Treating RN: 1955/06/22 (64 y.o. 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 Dressing Change Frequency Wound #9 Right,Medial Foot Do not change entire dressing for one week. Wound Cleansing Wound #9 Right,Medial Foot May shower with protection. - use cast protector Primary Wound Dressing Wound #9 Right,Medial Foot Polymem Silver Secondary Dressing Wound #9 Right,Medial Foot Foam - foam donut ABD pad Zetuvit or Kerramax Edema Control 3 Layer Compression System - Right Lower Extremity Avoid standing for long periods of time Elevate legs to the level of the heart or above for 30 minutes daily and/or when sitting, a frequency of: -  throughout the day Exercise regularly Off-Loading Open toe surgical shoe to: - right foot Electronic Signature(s) Signed: 09/02/2019 5:14:32 PM By: Zandra Abts RN, BSN Signed: 09/02/2019 5:53:04 PM By: Baltazar Najjar MD Entered By: Zandra Abts on 09/02/2019 14:18:21 -------------------------------------------------------------------------------- Problem List Details Patient Name: Date of Service: [name], [name] 09/02/2019 1:00 PM Medical Record Number: [id number] Patient Account Number: [id number] Date of Birth/Sex: Treating RN: [birth year] (64 y.o. [name] Primary Care Provider: MA SO UDI, Judene Companion MA LSA DA T Other Clinician: Referring Provider: Treating Provider/Extender: Abe People, ELHA MA LSA DA T Weeks in Treatment: 75 Active Problems ICD-10 Encounter Code Description Active Date MDM Diagnosis E11.621 Type 2 diabetes mellitus with foot ulcer 01/28/2019 No Yes L97.511 Non-pressure chronic ulcer of other part of right foot limited to breakdown of 01/28/2019 No Yes skin E11.42 Type 2 diabetes mellitus with diabetic polyneuropathy 01/28/2019 No Yes M14.671 Charcot's joint, right ankle and foot 01/28/2019 No Yes M14.672 Charcot's joint, left ankle and foot 01/28/2019 No Yes 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, left ankle and foot 04/08/2019 No Yes Inactive Problems ICD-10 Code Description Active Date Inactive Date L97.524 Non-pressure chronic ulcer of other part of left foot with necrosis of bone 01/28/2019 01/28/2019 L97.514 Non-pressure chronic ulcer of other part of right foot with necrosis of bone 01/28/2019 01/28/2019 Resolved Problems Electronic Signature(s) Signed: 09/02/2019 5:53:04 PM By: Baltazar Najjar MD Entered By: Baltazar Najjar on 09/02/2019 14:21:39 -------------------------------------------------------------------------------- Progress Note Details Patient Name: Date of Service: [name], [name] 09/02/2019 1:00 PM Medical Record  Number: [id number] Patient Account Number: [id number] Date of Birth/Sex: Treating RN: 17-May-1955 (64 y.o. [name] Primary Care Provider: MA SO UDI, Judene Companion MA LSA DA T Other Clinician: Referring Provider: Treating Provider/Extender: Baltazar Najjar MA SO UDI, ELHA MA LSA DA T Weeks in Treatment: 31 Subjective History of Present Illness (HPI) 64 yrs old bf  here for follow up recurrent left charcot foot ulcer. She has DM. She is not a smoker. She states a blister developed 3 months ago at site of previous breakdown from 1 year ago. It was debrided at the podiatrist's office. She went to the ER and then sent here. She denies pain. Xray was negative for osteo. Culture from this clinic negative. 09/08/14 Referred by [name] to [name] for Charcot foot. Seen by him and MRI scheduled 09/19/14. Prior silver collagen but this was not ordered last visit, patient has been rinsing with saline alone. Re institute silver collagen every other day. F/u 2 weeks with [name] as Labor Day holiday. Supplies ordered. 09/25/14; this is a patient with a Charcot foot on the left. she has a wound over the plantar aspect of her left calcaneus. She has been seen by [name] of orthopedic surgery and has had an MRI. She is apparently going within the next week or 2 for corrective surgery which will involve an external fixator. I don't have any of the details of this. 10/06/14; The patient is a 64 yrs old bf with a left Charcot foot and ulcer on the plantar. 12/01/14 Returns post surgery with [name]. Has follow up visit later this week with him, currently in facility and NWB over this extremity. States no drainage from foot and no current wound care. On exam callus and scab in place, suspect healed. Has external fixator in place. Will defer to [name] for management, ok to place moisturizing cream over scabs and callus and she will call if she requires follow up here. READMISSION 01/28/2019 Patient was  in the clinic in 2016 for a wound on the plantar aspect of her left calcaneus. Predominantly cared for by [name] she was referred to [name] and had corrective surgery for the position of her left ankle I believe. She had previously been here in 2015 and then prior to that in 2011 2012 that I do not have these records. More recently she has developed fairly extensive wounds on the right medial foot, left lateral foot and a small area on the right medial great toe. Right medial foot wound has been there for 6 to 7 months, left foot wound for 2 to 3 months and the right great toe only over the last few weeks. She has been followed by [name] at Dr. Laverta [place] office it sounds as though she has been undergoing callus paring and silver nitrate. The patient has been washing these off with soap and water and plan applying dry dressing Past medical history, type 2 diabetes well controlled with a recent hemoglobin A1c of 7 on 11/16, type 2 diabetes with peripheral neuropathy, previous left Charcot joint surgery bilateral Charcot joints currently, stage III chronic renal failure, hypertension, obstructive sleep apnea, history of MRSA and gastroesophageal reflux. 1/18; this is a patient with bilateral Charcot feet. Plain x-rays that I did of both of these wounds that are either at bone or precariously close to bone were done. On the right foot this showed possible lytic destruction involving the anterior aspect of the talus which may represent a wound osteomyelitis. An MRI was recommended. On the left there was no definite lytic destruction although the wound is deep undermining's and I think probably requires an MRI on this basis in any case 1/25; patient was supposed to have her MRI last Friday of her bilateral feet however they would not do it because there was silver alginate in  her dressings, will replace with calcium alginate today and will see if we can get her done today 2/1; patient did  not get her MRIs done last week because of issues with the MRI department receiving orders to do bilateral feet. She has 2 deep wounds in the setting of Charcot feet deformities bilaterally. Both of them at one point had exposed bone although I had trouble demonstrating that today. I am not sure that we can offload these very aggressively as I watched her leave the clinic last week her gait is very narrow and unsteady. 2/15; the MRI of her right foot did not show osteomyelitis potentially osteonecrosis of the talus. 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 The wounds are a large wound on the right medial foot and on the left a substantial wound on the left lateral plantar calcaneus. We have been using silver alginate. Ultimately I think we need to consider a total contact cast on the right while we work through the possibility of osteomyelitis in the left. I watched her walk out with her crutches I have some trepidation about the ability to walk with the cast on her right foot. Nevertheless with her Charcot deformities I do not think there is a way to heal these short of offloading them aggressively. 2/22; the culture of the bone in the left foot that I did last week showed a few Citrobacter Koseri as well as some streptococcal species. In my attempted bone for pathology did not actually yield a lot of bone the pathologist did not identify any osteomyelitis. 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 Is promised today and aggressive debridement of the right foot and a total contact cast which surprisingly she seemed to tolerate quite well 03/13/2019 upon evaluation today patient appears to be doing well with her total contact cast she is here for the first cast change. She had no areas of rubbing or any other  complications at this point. Overall things are doing quite well. 2/26; the patient was kindly seen by Dr. Orvan Falconer of infectious disease on 2/25. She is now on IV vancomycin PICC line placement in 2 days and oral Flagyl. This was in response to her MRI showing osteomyelitis of the left calcaneus concerning for osteomyelitis. We have been putting her right leg in a total contact cast. Our nurses report drainage. She is tolerating the total contact cast well. 3/4; the patient is started her IV antibiotics and is taking IV vancomycin and oral Flagyl. She appears to be tolerating these both well. This is for osteomyelitis involving the left calcaneus. The area on the right in the setting of bilateral Charcot deformities did not have any bone infection on MRI. We put a total contact cast on her with silver alginate as the primary dressing and she returns today in follow-up. Per our intake nurse there was too much drainage to leave this on all week therefore we will bring her back for an early change 3/8; follow-up total contact cast she is still having a lot of drainage probably too much drainage from the right foot wound will week with the same cast. She will be back on Thursday. The area on the left looks somewhat better 3/11; patient here for a total contact cast change. 3/15; patient came in for wound care  evaluation. She is still on IV vancomycin and oral Flagyl for osteomyelitis in the left foot. The area on the right foot we are putting in a total contact cast. 3/18 for total contact cast change on the right foot 3/22; the patient was here for wound review today. The area on the right foot is slightly smaller in terms of surface area. However the surface of the wound is very gritty and hyper granulated. More problematically the area on the plantar left foot has increased depth indeed in the center of this it probes to bone. We have been using Hydrofera Blue in both areas. She is on antibiotics as  prescribed by infectious disease IV vancomycin and oral Flagyl 3/25; the patient is in for total contact cast change. Our intake nurse was concerned about the amount of drainage which soaked right to the multiple layers we are putting on this. Wondered about options. The wound bed actually looks as healthy as this is looked although there may not be a lot of change in dimensions things are looking a lot better. If we are going to heal this woman's wound which is in the middle of her right Charcot foot this is going to need to be offloaded. There are not many alternatives 3/29; still not a lot of improvement although the surface of the right wound looks better. We have been using Hydrofera Blue 04/17/2019 upon evaluation today patient appears to be doing well with a total contact cast. With actually having to change this twice a week. She saw [name] for wound care earlier in the week this is for the cast change today. That is due to the fact that she has significant drainage. Overall the wound is been looking excellent however. 4/5; we continue to put a total contact cast on this patient with Hydrofera Blue. Still a lot of drainage which precludes a simple weekly change or changing this twice a week. 4/8; total contact cast changed today. Apparently the wound looks better per our intake nurse 4/12; patient here for wound care evaluation. Wounds on the right foot have not changed in dimension none about a month left foot looks similar. Apparently her antibiotics that we are giving her for osteomyelitis in the left foot complete on Wednesday. We have been using Hydrofera Blue on the right foot under a total contact cast. Nurses report greenish drainage although I think this may be discoloration from the [facility] ([place]) 4/15; back for cast change wounds look quite good although still moderate amount of drainage. T contact cast reapplied otal 4/19;. Wound evaluation. Wound on the right foot is smaller  surface looks healthy. There is still the divot in the middle part of this wound but I could not feel any bone. Area on the left foot also looks better She has completed her antibiotics but still has the PICC line in. 4/23; patient comes back for a total contact cast change this was done in the standard fashion no issues 4/26; wound measures slightly larger on the right foot which is disappointing. She sees Dr. Orvan Falconer with regards to her antibiotics on Wednesday. I believe she is on vancomycin IV and oral Flagyl. 4/29; in for her routine total contact cast change. She saw Dr. Orvan Falconer this morning. He is asking about antibiotic continuation I will be in touch with him. I did not look at her wound today 5/3; patient saw Dr. Orvan Falconer on 4/28. I did send him a note asking about the continuation of perhaps an oral  regimen. I have not heard back. She still has an elevated sedimentation rate at 81. Her wounds are smaller. 5/6; in for her total contact cast change. We did not look at the wound today. She has seen Dr. Orvan Falconer and is now on oral Keflex and Flagyl. She seems to be tolerating these well 5/10; she had her PICC line removed. The wound on the right foot after some initial improvement has not made any improvement even with the total contact cast. Still having a lot of drainage. Likewise the area on the left foot really is not any better either 5/14; in for a cast change today. We will look at the right foot on Monday. If this is not making any progress I am going to try to cast the left foot. 5/17; the wound on the right foot is absolutely no different. I am going to put ointment on this area and change the cast of the left foot. We have been using silver alginate there 5/21; apparently the area on the right foot was better per our intake nurse although I did not see it. We did a standard total contact cast change on the left foot we have been applying silver alginate to the wound  here. 06/10/19-Area on the right foot per dimensions slightly better, the left foot has still some depth applying silver alginate to the wound with TCC 06/13/19-Patient here for Casting to Left foot 6/1; patient had the total contact cast were placed on the left foot. Noted some swelling in her right leg although the dimensions of the wound on the right foot are better. We have been using PolyMem on the right and silver alginate on the left under the cast 6/7; we continue to make nice progress in both her plantar feet wound. We are putting the total contact cast on the left. Surprisingly in spite of this the area on the right medial foot in the middle of her Charcot deformities actually is making progress as well 07/01/2019 upon evaluation today patient actually appears to be doing quite well with regard to her wounds on the feet compared to when I saw her last that has been quite some time. With that being said I do believe the total contact cast has been beneficial in this left foot and she has a lot of signs of improvement she does have some callus overhanging which is caused a little bit of a ledge and an issue here. I do believe that we may be able to do something to help in that regard. Fortunately otherwise the wound seems to be progressing quite nicely. 6/21; the patient's area on the left foot that we are currently casting is just about closed. The area on the right medial foot has a healthy looking surface but may be not changed as much from last week in terms of surface area. We are using silver alginate on the right and polymen Ag under the cast 6/28; only a small open area on the left foot remains. Using silver alginate under a total contact cast. She has a diabetic shoe I have asked her to bring that for next week where this may be closed. The area on the right foot with a Charcot deformity is also measuring slightly smaller. We are using PolyMem Ag here 7/6; left foot is totally closed.  She does not require a total contact cast. She will graduate to her own diabetic shoe. ooThe area on the right foot with a Charcot deformity measuring about the  same. We have been using polymen under compression wraps. She did not do well in this area with a total contact cast 7/13; left foot remains intact. She is skillfully covering this area with foam and using her diabetic shoe. Slight area improvement of the right foot wound. Still rolled senescent looking edges. We have been using polymen 7/19; left foot remains intact. Wound measuring slightly smaller on the right foot in the middle of the Charcot deformity. We have been using PolyMem Ag 7/26; per the patient left foot remains intact. The wound on the right foot looks slightly smaller but dimensions are measuring the same. We have been using polymen Ag 8/9-Patient back after 2 weeks, wound of the right foot again looking slightly smaller, continuing to use PolyMem silver 8/16; patient has made some minor improvements in wound area on the right foot. Using polymen Ag. The left foot remains healed Objective Constitutional Sitting or standing Blood Pressure is within target range for patient.. Pulse regular and within target range for patient.Marland Kitchen Respirations regular, non-labored and within target range.. Temperature is normal and within the target range for the patient.Marland Kitchen Appears in no distress. Vitals Time Taken: 1:29 PM, Height: 68 in, Weight: 265 lbs, BMI: 40.3, Temperature: 98.5 F, Pulse: 76 bpm, Respiratory Rate: 18 breaths/min, Blood Pressure: 114/67 mmHg. Respiratory work of breathing is normal. Cardiovascular Pedal pulses are palpable. General Notes: 8/16; right foot plantar clean base. Rim of callus and thick skin. Debrided with a #5 curette to a better looking circumference. Integumentary (Hair, Skin) Wound #9 status is Open. Original cause of wound was Blister. The wound is located on the Right,Medial Foot. The wound measures  1.7cm length x 1.5cm width x 0.4cm depth; 2.003cm^2 area and 0.801cm^3 volume. There is Fat Layer (Subcutaneous Tissue) Exposed exposed. There is no tunneling noted, however, there is undermining starting at 7:00 and ending at 12:00 with a maximum distance of 0.4cm. There is a medium amount of serosanguineous drainage noted. The wound margin is thickened. There is large (67-100%) red granulation within the wound bed. There is no necrotic tissue within the wound bed. 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 There was a Selective/Open Wound Non-Viable Tissue Debridement with a total area of 2.55 sq cm performed by [name], MD. With the following instrument(s): Curette to remove Non-Viable tissue/material. Material removed includes Callus. No specimens were taken. A time out was conducted at 14:18, prior to the start of the procedure. A Minimum amount of bleeding was controlled with Pressure. The procedure was tolerated well with a pain level of 0 throughout and a pain level of 0 following the procedure. Post Debridement Measurements: 1.7cm length x 1.5cm width x 0.4cm depth; 0.801cm^3 volume. Character of Wound/Ulcer Post Debridement is improved. Severity of Tissue Post Debridement is: Fat layer exposed. Post procedure Diagnosis Wound #9: Same as Pre-Procedure Pre-procedure diagnosis of Wound #9 is a Diabetic Wound/Ulcer of the Lower Extremity located on the Right,Medial Foot . There was a Three  Layer Compression Therapy Procedure by Zandra Abts, RN. Post procedure Diagnosis Wound #9: Same  as Pre-Procedure Plan Follow-up Appointments: Return Appointment in 1 week. Dressing Change Frequency: Wound #9 Right,Medial Foot: Do not change entire dressing for one week. Wound Cleansing: Wound #9 Right,Medial Foot: May shower with protection. - use cast protector Primary Wound Dressing: Wound #9 Right,Medial Foot: Polymem Silver Secondary Dressing: Wound #9 Right,Medial Foot: Foam - foam donut ABD pad Zetuvit or Kerramax Edema Control: 3 Layer Compression System - Right Lower Extremity Avoid standing for long periods of time Elevate legs to the level of the heart or above for 30 minutes daily and/or when sitting, a frequency of: - throughout the day Exercise regularly Off-Loading: Open toe surgical shoe to: - right foot #1 I am continuing with polymen Ag to the right foot 2. We are offloading this is best we can. This previously did not respond to total contact casting although her left foot wound bed. Electronic Signature(s) Signed: 09/02/2019 5:53:04 PM By: Baltazar Najjar MD Entered By: Baltazar Najjar on 09/02/2019 14:27:14 -------------------------------------------------------------------------------- SuperBill Details Patient Name: Date of Service: [facility], [name] 09/02/2019 Medical Record Number: [id number] Patient Account Number: [id number] Date of Birth/Sex: Treating RN: 09-13-1955 (64 y.o. [name] Primary Care Provider: MA SO UDI, [place] MA LSA DA T Other Clinician: Referring Provider: Treating Provider/Extender: Abe People, ELHA MA LSA DA T Weeks in Treatment: 31 Diagnosis Coding ICD-10 Codes Code Description E11.621 Type 2 diabetes mellitus with foot ulcer L97.511 Non-pressure chronic ulcer of other part of right foot limited to breakdown of skin E11.42 Type 2 diabetes mellitus with diabetic polyneuropathy M14.671 Charcot's  joint, right ankle and foot M14.672 Charcot's joint, left ankle and foot 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, left ankle and foot Facility Procedures CPT4 Code: [id number] Description: 97597 - DEBRIDE WOUND 1ST 20 SQ CM OR < ICD-10 Diagnosis Description E11.621 Type 2 diabetes mellitus with foot ulcer L97.511 Non-pressure chronic ulcer of other part of right foot limited to breakdown of s Modifier: kin Quantity: 1 Physician Procedures : CPT4 Code Description Modifier [id number] - WC PHYS DEBR WO ANESTH 20 SQ CM ICD-10 Diagnosis Description E11.621 Type 2 diabetes mellitus with foot ulcer L97.511 Non-pressure chronic ulcer of other part of right foot limited to breakdown of skin Quantity: 1 Electronic Signature(s) Signed: 09/02/2019 5:53:04 PM By: Baltazar Najjar MD Entered By: Baltazar Najjar on 09/02/2019 14:27:50

## 2019-09-03 NOTE — Progress Notes (Signed)
[name], [name] ([id number]) Visit Report for 09/02/2019 Arrival Information Details Patient Name: Date of Service: [name], [name] 09/02/2019 1:00 PM Medical Record Number: [id number] Patient Account Number: [id number] Date of Birth/Sex: Treating RN: 10/29/1955 (64 y.o. F) Dwiggins, Shannon Primary Care Dayvon Dax: MA SO UDI, [place] MA LSA DA T Other Clinician: Referring Aaleyah Witherow: Treating Renell Coaxum/Extender: Baltazar Najjar MA SO UDI, ELHA MA LSA DA T Weeks in Treatment: 31 Visit Information History Since Last Visit Added or deleted any medications: No Patient Arrived: Crutches Any new allergies or adverse reactions: No Arrival Time: 13:29 Had a fall or experienced change in No Accompanied By: self activities of daily living that may affect Transfer Assistance: None risk of falls: Patient Identification Verified: Yes Signs or symptoms of abuse/neglect since last visito No Secondary Verification Process Completed: Yes Hospitalized since last visit: No Patient Requires Transmission-Based Precautions: No Implantable device outside of the clinic excluding No Patient Has Alerts: No cellular tissue based products placed in the [name] since last visit: Has Dressing in Place as Prescribed: Yes Has Compression in Place as Prescribed: Yes Pain Present Now: No Electronic Signature(s) Signed: 09/02/2019 5:24:06 PM By: [name] Entered By: [name] on 09/02/2019 13:29:50 -------------------------------------------------------------------------------- Compression Therapy Details Patient Name: Date of Service: [facility], Nandana J. 09/02/2019 1:00 PM Medical Record Number: [id number] Patient Account Number: [id number] Date of Birth/Sex: Treating RN: 02-07-1955 (64 y.o. [name] Primary Care Shanae Luo: MA SO UDI, Judene Companion MA LSA DA T Other Clinician: Referring Lillan Mccreadie: Treating Alfred Harrel/Extender: Baltazar Najjar MA SO UDI, ELHA MA LSA DA T Weeks in Treatment:  31 Compression Therapy Performed for Wound Assessment: Wound #9 Right,Medial Foot Performed By: Clinician Zandra Abts, RN Compression Type: Three Layer Post Procedure Diagnosis Same as Pre-procedure Electronic Signature(s) Signed: 09/02/2019 5:14:32 PM By: Zandra Abts RN, BSN Entered By: Zandra Abts on 09/02/2019 14:20:17 -------------------------------------------------------------------------------- Encounter Discharge Information Details Patient Name: Date of Service: [name], [name] 09/02/2019 1:00 PM Medical Record Number: [id number] Patient Account Number: [id number] Date of Birth/Sex: Treating RN: 1955/08/14 (64 y.o. F) [name] Primary Care Ridley Schewe: MA SO UDI, [place] MA LSA DA T Other Clinician: Referring Nyssa Sayegh: Treating Brittnee Gaetano/Extender: Baltazar Najjar MA SO UDI, ELHA MA LSA DA T Weeks in Treatment: 47 Encounter Discharge Information Items Post Procedure Vitals Discharge Condition: Stable Temperature (F): 98.5 Ambulatory Status: Crutches Pulse (bpm): 76 Discharge Destination: Home Respiratory Rate (breaths/min): 18 Transportation: Other Blood Pressure (mmHg): 114/67 Accompanied By: self Schedule Follow-up Appointment: Yes Clinical Summary of Care: Patient Declined Electronic Signature(s) Signed: 09/02/2019 5:24:06 PM By: [name] Entered By: [name] on 09/02/2019 14:47:24 -------------------------------------------------------------------------------- Lower Extremity Assessment Details Patient Name: Date of Service: [name], [name] 09/02/2019 1:00 PM Medical Record Number: [id number] Patient Account Number: [id number] Date of Birth/Sex: Treating RN: Dec 10, 1955 (64 y.o. F) [name] Primary Care Chesni Vos: MA SO UDI, [place] MA LSA DA T Other Clinician: Referring Renaye Janicki: Treating Wister Hoefle/Extender: Baltazar Najjar MA SO UDI, ELHA MA LSA DA T Weeks in Treatment: 31 Edema Assessment Assessed: [Left: No]  [Right: No] Edema: [Left: Ye] [Right: s] Calf Left: Right: Point of Measurement: 43 cm From Medial Instep cm 31.5 cm Ankle Left: Right: Point of Measurement: 12 cm From Medial Instep cm 22.5 cm Vascular Assessment Pulses: Dorsalis Pedis Palpable: [Right:Yes] Electronic Signature(s) Signed: 09/02/2019 5:24:06 PM By: [name] Signed: 09/03/2019 5:25:51 PM By: Zenaida Deed RN, BSN Entered By: Zenaida Deed on 09/02/2019 13:53:18 -------------------------------------------------------------------------------- Multi Wound Chart Details Patient Name: Date of Service: [name], [name]  09/02/2019 1:00 PM Medical Record Number: [id number] Patient Account Number: [id number] Date of Birth/Sex: Treating RN: [birth year] (64 y.o. [name] Primary Care Colton Engdahl: MA SO UDI, Judene Companion MA LSA DA T Other Clinician: Referring Kaianna Dolezal: Treating Shatarra Wehling/Extender: Baltazar Najjar MA SO UDI, ELHA MA LSA DA T Weeks in Treatment: 31 Vital Signs Height(in): 68 Pulse(bpm): 76 Weight(lbs): 265 Blood Pressure(mmHg): 114/67 Body Mass Index(BMI): 40 Temperature(F): 98.5 Respiratory Rate(breaths/min): 18 Photos: [9:No Photos Right, Medial Foot] [N/A:N/A N/A] Wound Location: [9:Blister] [N/A:N/A] Wounding Event: [9:Diabetic Wound/Ulcer of the Lower] [N/A:N/A] Primary Etiology: [9:Extremity Glaucoma, Chronic sinus] [N/A:N/A] Comorbid History: [9:problems/congestion, Anemia, Sleep Apnea, Hypertension, Type II Diabetes, Rheumatoid Arthritis, Neuropathy 06/18/2018] [N/A:N/A] Date Acquired: [9:31] [N/A:N/A] Weeks of Treatment: [9:Open] [N/A:N/A] Wound Status: [9:1.7x1.5x0.4] [N/A:N/A] Measurements L x W x D (cm) [9:2.003] [N/A:N/A] A (cm) : rea [9:0.801] [N/A:N/A] Volume (cm) : [9:81.80%] [N/A:N/A] % Reduction in A [9:rea: 91.90%] [N/A:N/A] % Reduction in Volume: [9:7] Starting Position 1 (o'clock): [9:12] Ending Position 1 (o'clock): [9:0.4] Maximum Distance 1 (cm): [9:Yes]  [N/A:N/A] Undermining: [9:Grade 2] [N/A:N/A] Classification: [9:Medium] [N/A:N/A] Exudate A mount: [9:Serosanguineous] [N/A:N/A] Exudate Type: [9:red, brown] [N/A:N/A] Exudate Color: [9:Thickened] [N/A:N/A] Wound Margin: [9:Large (67-100%)] [N/A:N/A] Granulation A mount: [9:Red] [N/A:N/A] Granulation Quality: [9:None Present (0%)] [N/A:N/A] Necrotic A mount: [9:Fat Layer (Subcutaneous Tissue)] [N/A:N/A] Exposed Structures: [9:Exposed: Yes Fascia: No Tendon: No Muscle: No Joint: No Bone: No Small (1-33%)] [N/A:N/A] Epithelialization: [9:Debridement - Selective/Open Wound] [N/A:N/A] Debridement: Pre-procedure Verification/Time Out 14:18 [N/A:N/A] Taken: [9:Callus] [N/A:N/A] Tissue Debrided: [9:Non-Viable Tissue] [N/A:N/A] Level: [9:2.55] [N/A:N/A] Debridement A (sq cm): [9:rea Curette] [N/A:N/A] Instrument: [9:Minimum] [N/A:N/A] Bleeding: [9:Pressure] [N/A:N/A] Hemostasis A chieved: [9:0] [N/A:N/A] Procedural Pain: [9:0] [N/A:N/A] Post Procedural Pain: [9:Procedure was tolerated well] [N/A:N/A] Debridement Treatment Response: [9:1.7x1.5x0.4] [N/A:N/A] Post Debridement Measurements L x W x D (cm) [9:0.801] [N/A:N/A] Post Debridement Volume: (cm) [9:Compression Therapy] [N/A:N/A] Procedures Performed: [9:Debridement] Treatment Notes Electronic Signature(s) Signed: 09/02/2019 5:14:32 PM By: Zandra Abts RN, BSN Signed: 09/02/2019 5:53:04 PM By: Baltazar Najjar MD Entered By: Baltazar Najjar on 09/02/2019 14:22:31 -------------------------------------------------------------------------------- Multi-Disciplinary Care Plan Details Patient Name: Date of Service: [name], [name] 09/02/2019 1:00 PM Medical Record Number: [id number] Patient Account Number: [id number] Date of Birth/Sex: Treating RN: 1955-10-20 (64 y.o. [name] Primary Care Katalin Colledge: MA SO UDI, Judene Companion MA LSA DA T Other Clinician: Referring Anagabriela Jokerst: Treating Chaos Carlile/Extender: Baltazar Najjar MA SO UDI,  ELHA MA LSA DA T Weeks in Treatment: 31 Active Inactive Wound/Skin Impairment Nursing Diagnoses: Impaired tissue integrity Knowledge deficit related to ulceration/compromised skin integrity Goals: Patient/caregiver will verbalize understanding of skin care regimen Date Initiated: 01/28/2019 Target Resolution Date: 10/04/2019 Goal Status: Active Interventions: Assess patient/caregiver ability to obtain necessary supplies Assess patient/caregiver ability to perform ulcer/skin care regimen upon admission and as needed Assess ulceration(s) every visit Provide education on ulcer and skin care Notes: Electronic Signature(s) Signed: 09/02/2019 5:14:32 PM By: Zandra Abts RN, BSN Entered By: Zandra Abts on 09/02/2019 14:24:01 -------------------------------------------------------------------------------- Pain Assessment Details Patient Name: Date of Service: [name], [name] 09/02/2019 1:00 PM Medical Record Number: [id number] Patient Account Number: [id number] Date of Birth/Sex: Treating RN: [birth year] (64 y.o. [name] Primary Care Malka Bocek: MA SO UDI, [place] MA LSA DA T Other Clinician: Referring Rogena Deupree: Treating Leno Mathes/Extender: Baltazar Najjar MA SO UDI, ELHA MA LSA DA T Weeks in Treatment: 44 Active Problems Location of Pain Severity and Description of Pain Patient Has Paino No Site Locations Pain Management and Medication Current Pain Management: Electronic Signature(s) Signed: 09/02/2019 5:24:06 PM By: [name] Entered By:  [name] on 09/02/2019 13:30:22 -------------------------------------------------------------------------------- Patient/Caregiver Education Details Patient Name: Date of Service: [name], [name] 8/16/2021andnbsp1:00 PM Medical Record Number: [id number] Patient Account Number: [id number] Date of Birth/Gender: Treating RN: March 12, 1955 (64 y.o. [name] Primary Care Physician: MA SO UDI, Judene Companion MA LSA  DA T Other Clinician: Referring Physician: Treating Physician/Extender: Baltazar Najjar MA SO UDI, ELHA MA LSA DA T Weeks in Treatment: 67 Education Assessment Education Provided To: Patient Education Topics Provided Wound/Skin Impairment: Methods: Explain/Verbal Responses: State content correctly [employer]) Signed: 09/02/2019 5:14:32 PM By: Zandra Abts RN, BSN Entered By: Zandra Abts on 09/02/2019 14:25:04 -------------------------------------------------------------------------------- Wound Assessment Details Patient Name: Date of Service: [name], [name] 09/02/2019 1:00 PM Medical Record Number: [id number] Patient Account Number: [id number] Date of Birth/Sex: Treating RN: Jun 26, 1955 (64 y.o. F) Dwiggins, Shannon Primary Care Mairin Lindsley: MA SO UDI, [place] MA LSA DA T Other Clinician: Referring Cyleigh Massaro: Treating Marissa Weaver/Extender: Baltazar Najjar MA SO UDI, ELHA MA LSA DA T Weeks in Treatment: 31 Wound Status Wound Number: 9 Primary Diabetic Wound/Ulcer of the Lower Extremity Etiology: Wound Location: Right, Medial Foot Wound Open Wounding Event: Blister Status: Date Acquired: 06/18/2018 Comorbid Glaucoma, Chronic sinus problems/congestion, Anemia, Sleep Weeks Of Treatment: 31 History: Apnea, Hypertension, Type II Diabetes, Rheumatoid Arthritis, Clustered Wound: No Neuropathy Photos Photo Uploaded By: Benjaman Kindler on 09/03/2019 16:06:00 Wound Measurements Length: (cm) 1.7 % Width: (cm) 1.5 % Depth: (cm) 0.4 E Area: (cm) 2.003 Volume: (cm) 0.801 Reduction in Area: 81.8% Reduction in Volume: 91.9% pithelialization: Small (1-33%) Tunneling: No Undermining: Yes Starting Position (o'clock): 7 Ending Position (o'clock): 12 Maximum Distance: (cm) 0.4 Wound Description Classification: Grade 2 F Wound Margin: Thickened S Exudate Amount: Medium Exudate Type: Serosanguineous Exudate Color: red, brown oul Odor After Cleansing: No lough/Fibrino  No Wound Bed Granulation Amount: Large (67-100%) Exposed Structure Granulation Quality: Red Fascia Exposed: No Necrotic Amount: None Present (0%) Fat Layer (Subcutaneous Tissue) Exposed: Yes Tendon Exposed: No Muscle Exposed: No Joint Exposed: No Bone Exposed: No Treatment Notes Wound #9 (Right, Medial Foot) 1. Cleanse With Wound Cleanser Soap and water 3. Primary Dressing Applied Polymem Ag 4. Secondary Dressing ABD Pad Dry Gauze Foam Other secondary dressing (specify in notes) 6. Support Layer Applied 3 layer compression wrap Notes zetuvit. Scientist, research (physical sciences)) Signed: 09/02/2019 5:24:06 PM By: [name] Signed: 09/03/2019 5:25:51 PM By: Zenaida Deed RN, BSN Entered By: Zenaida Deed on 09/02/2019 13:52:27 -------------------------------------------------------------------------------- Vitals Details Patient Name: Date of Service: [name], [name] 09/02/2019 1:00 PM Medical Record Number: [id number] Patient Account Number: [id number] Date of Birth/Sex: Treating RN: [birth year] (64 y.o. F) Dwiggins, Shannon Primary Care Rahel Carlton: MA SO UDI, [place] MA LSA DA T Other Clinician: Referring Eyvonne Burchfield: Treating Haruo Stepanek/Extender: Baltazar Najjar MA SO UDI, ELHA MA LSA DA T Weeks in Treatment: 31 Vital Signs Time Taken: 13:29 Temperature (F): 98.5 Height (in): 68 Pulse (bpm): 76 Weight (lbs): 265 Respiratory Rate (breaths/min): 18 Body Mass Index (BMI): 40.3 Blood Pressure (mmHg): 114/67 Reference Range: 80 - 120 mg / dl Electronic Signature(s) Signed: 09/02/2019 5:24:06 PM By: [name] Entered By: [name] on 09/02/2019 13:30:16

## 2019-09-09 ENCOUNTER — Encounter (HOSPITAL_BASED_OUTPATIENT_CLINIC_OR_DEPARTMENT_OTHER): Payer: Medicare Other | Admitting: Internal Medicine

## 2019-09-09 DIAGNOSIS — E11621 Type 2 diabetes mellitus with foot ulcer: Secondary | ICD-10-CM | POA: Diagnosis not present

## 2019-09-10 NOTE — Progress Notes (Signed)
CHUDNEY, SCHEFFLER ([id number]) Visit Report for 09/09/2019 Debridement Details Patient Name: Date of Service: [name], [name] 09/09/2019 1:45 PM Medical Record Number: [id number] Patient Account Number: [id number] Date of Birth/Sex: Treating RN: 1955/06/19 (64 y.o. [name] Primary Care Provider: MA SO UDI, [place] MA LSA DA T Other Clinician: Referring Provider: Treating Provider/Extender: Baltazar Najjar MA SO UDI, ELHA MA LSA DA T Weeks in Treatment: 32 Debridement Performed for Assessment: Wound #9 Right,Medial Foot Performed By: Physician [name], MD Debridement Type: Debridement Severity of Tissue Pre Debridement: Fat layer exposed Level of Consciousness (Pre-procedure): Awake and Alert Pre-procedure Verification/Time Out Yes - 14:08 Taken: Start Time: 14:08 T Area Debrided (L x W): otal 1.4 (cm) x 1.5 (cm) = 2.1 (cm) Tissue and other material debrided: Non-Viable, Callus, Subcutaneous, Skin: Epidermis Level: Skin/Subcutaneous Tissue Debridement Description: Excisional Instrument: Curette Bleeding: Minimum Hemostasis Achieved: Pressure End Time: 14:09 Procedural Pain: 0 Post Procedural Pain: 0 Response to Treatment: Procedure was tolerated well Level of Consciousness (Post- Awake and Alert procedure): Post Debridement Measurements of Total Wound Length: (cm) 1.4 Width: (cm) 1.5 Depth: (cm) 0.5 Volume: (cm) 0.825 Character of Wound/Ulcer Post Debridement: Improved Severity of Tissue Post Debridement: Fat layer exposed Post Procedure Diagnosis Same as Pre-procedure Electronic Signature(s) Signed: 09/09/2019 4:21:43 PM By: Zandra Abts RN, BSN Signed: 09/09/2019 4:33:08 PM By: Baltazar Najjar MD Entered By: Baltazar Najjar on 09/09/2019 14:13:11 -------------------------------------------------------------------------------- HPI Details Patient Name: Date of Service: [name], [name] 09/09/2019 1:45 PM Medical Record Number:  [id number] Patient Account Number: [id number] Date of Birth/Sex: Treating RN: 1955/03/06 (64 y.o. [name] Primary Care Provider: MA SO UDI, [place] MA LSA DA T Other Clinician: Referring Provider: Treating Provider/Extender: Baltazar Najjar MA SO UDI, ELHA MA LSA DA T Weeks in Treatment: 56 History of Present Illness HPI Description: 64 yrs old bf here for follow up recurrent left charcot foot ulcer. She has DM. She is not a smoker. She states a blister developed 3 months ago at site of previous breakdown from 1 year ago. It was debrided at the podiatrist's office. She went to the ER and then sent here. She denies pain. Xray was negative for osteo. Culture from this clinic negative. 09/08/14 [name] to [name] for Charcot foot. Seen by him and MRI scheduled 09/19/14. Prior silver collagen but this was not ordered last visit, patient has been rinsing with saline alone. Re institute silver collagen every other day. F/u 2 weeks with [name] as Labor Day holiday. Supplies ordered. 09/25/14; this is a patient with a Charcot foot on the left. she has a wound over the plantar aspect of her left calcaneus. She has been seen by [name] of orthopedic surgery and has had an MRI. She is apparently going within the next week or 2 for corrective surgery which will involve an external fixator. I don't have any of the details of this. 10/06/14; The patient is a 64 yrs old bf with a left Charcot foot and ulcer on the plantar. 12/01/14 Returns post surgery with [name]. Has follow up visit later this week with him, currently in facility and NWB over this extremity. States no drainage from foot and no current wound care. On exam callus and scab in place, suspect healed. Has external fixator in place. Will defer to [name] for management, ok to place moisturizing cream over scabs and callus and she will call if she requires follow up here. READMISSION 01/28/2019 Patient was in  the  clinic in 2016 for a wound on the plantar aspect of her left calcaneus. Predominantly cared for by Dr. [name] she was referred to [name] and had corrective surgery for the position of her left ankle I believe. She had previously been here in 2015 and then prior to that in 2011 2012 that I do not have these records. More recently she has developed fairly extensive wounds on the right medial foot, left lateral foot and a small area on the right medial great toe. Right medial foot wound has been there for 6 to 7 months, left foot wound for 2 to 3 months and the right great toe only over the last few weeks. She has been followed by [name] at Dr. [name] office it sounds as though she has been undergoing callus paring and silver nitrate. The patient has been washing these off with soap and water and plan applying dry dressing Past medical history, type 2 diabetes well controlled with a recent hemoglobin A1c of 7 on 11/16, type 2 diabetes with peripheral neuropathy, previous left Charcot joint surgery bilateral Charcot joints currently, stage III chronic renal failure, hypertension, obstructive sleep apnea, history of MRSA and gastroesophageal reflux. 1/18; this is a patient with bilateral Charcot feet. Plain x-rays that I did of both of these wounds that are either at bone or precariously close to bone were done. On the right foot this showed possible lytic destruction involving the anterior aspect of the talus which may represent a wound osteomyelitis. An MRI was recommended. On the left there was no definite lytic destruction although the wound is deep undermining's and I think probably requires an MRI on this basis in any case 1/25; patient was supposed to have her MRI last Friday of her bilateral feet however they would not do it because there was silver alginate in her dressings, will replace with calcium alginate today and will see if we can get her done today 2/1; patient did not get  her MRIs done last week because of issues with the MRI department receiving orders to do bilateral feet. She has 2 deep wounds in the setting of Charcot feet deformities bilaterally. Both of them at one point had exposed bone although I had trouble demonstrating that today. I am not sure that we can offload these very aggressively as I watched her leave the clinic last week her gait is very narrow and unsteady. 2/15; the MRI of her right foot did not show osteomyelitis potentially osteonecrosis of the talus. 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 The wounds are a large wound on the right medial foot and on the left a substantial wound on the left lateral plantar calcaneus. We have been using silver alginate. Ultimately I think we need to consider a total contact cast on the right while we work through the possibility of osteomyelitis in the left. I watched her walk out with her crutches I have some trepidation about the ability to walk with the cast on her right foot. Nevertheless with her Charcot deformities I do not think there is a way to heal these short of offloading them aggressively. 2/22; the culture of the bone in the left foot that I did last week showed a few Citrobacter Koseri as well as some streptococcal species. In my attempted bone for pathology did not actually yield a lot of bone the pathologist did not identify any osteomyelitis. 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 Is promised today and aggressive debridement of the right foot and a total contact cast which surprisingly she seemed to tolerate quite well 03/13/2019 upon evaluation today patient appears to be doing well with her total contact cast she is here for the first cast change. She had no areas of rubbing or any other  complications at this point. Overall things are doing quite well. 2/26; the patient was kindly seen by Dr. Megan Salon of infectious disease on 2/25. She is now on IV vancomycin PICC line placement in 2 days and oral Flagyl. This was in response to her MRI showing osteomyelitis of the left calcaneus concerning for osteomyelitis. We have been putting her right leg in a total contact cast. Our nurses report drainage. She is tolerating the total contact cast well. 3/4; the patient is started her IV antibiotics and is taking IV vancomycin and oral Flagyl. She appears to be tolerating these both well. This is for osteomyelitis involving the left calcaneus. The area on the right in the setting of bilateral Charcot deformities did not have any bone infection on MRI. We put a total contact cast on her with silver alginate as the primary dressing and she returns today in follow-up. Per our intake nurse there was too much drainage to leave this on all week therefore we will bring her back for an early change 3/8; follow-up total contact cast she is still having a lot of drainage probably too much drainage from the right foot wound will week with the same cast. She will be back on Thursday. The area on the left looks somewhat better 3/11; patient here for a total contact cast change. 3/15; patient came in for wound care evaluation. She is still on IV vancomycin and oral Flagyl for osteomyelitis in the left foot. The area on the right foot we are putting in a total contact cast. 3/18 for total contact cast change on the right foot 3/22; the patient was here for wound review today. The area on the right foot is slightly smaller in terms of surface area. However the surface of the wound is very gritty and hyper granulated. More problematically the area on the plantar left foot has increased depth indeed in the center of this it probes to bone. We have been using Hydrofera Blue in both areas. She is on antibiotics as  prescribed by infectious disease IV vancomycin and oral Flagyl 3/25; the patient is in for total contact cast change. Our intake nurse was concerned about the amount of drainage which soaked right to the multiple layers we are putting on this. Wondered about options. The wound bed actually looks as healthy as this is looked although there may not be a lot of change in dimensions things are looking a lot better. If we are going to heal this woman's wound which is in the middle of her right Charcot foot this is going to need to be offloaded. There are not many alternatives 3/29; still not a lot of improvement although the surface of the right wound looks better. We have been using Hydrofera Blue 04/17/2019 upon evaluation today patient appears to be doing well with a total contact cast. With actually having to change this twice a week. She saw Dr. Dellia Nims for wound care earlier in the week this is for the cast change today. That is  due to the fact that she has significant drainage. Overall the wound is been looking excellent however. 4/5; we continue to put a total contact cast on this patient with Hydrofera Blue. Still a lot of drainage which precludes a simple weekly change or changing this twice a week. 4/8; total contact cast changed today. Apparently the wound looks better per our intake nurse 4/12; patient here for wound care evaluation. Wounds on the right foot have not changed in dimension none about a month left foot looks similar. Apparently her antibiotics that we are giving her for osteomyelitis in the left foot complete on Wednesday. We have been using Hydrofera Blue on the right foot under a total contact cast. Nurses report greenish drainage although I think this may be discoloration from the [facility] 4/15; back for cast change wounds look quite good although still moderate amount of drainage. T contact cast reapplied otal 4/19;. Wound evaluation. Wound on the right foot is smaller  surface looks healthy. There is still the divot in the middle part of this wound but I could not feel any bone. Area on the left foot also looks better She has completed her antibiotics but still has the PICC line in. 4/23; patient comes back for a total contact cast change this was done in the standard fashion no issues 4/26; wound measures slightly larger on the right foot which is disappointing. She sees Dr. Orvan Falconer with regards to her antibiotics on Wednesday. I believe she is on vancomycin IV and oral Flagyl. 4/29; in for her routine total contact cast change. She saw Dr. Orvan Falconer this morning. He is asking about antibiotic continuation I will be in touch with him. I did not look at her wound today 5/3; patient saw Dr. Orvan Falconer on 4/28. I did send him a note asking about the continuation of perhaps an oral regimen. I have not heard back. She still has an elevated sedimentation rate at 81. Her wounds are smaller. 5/6; in for her total contact cast change. We did not look at the wound today. She has seen Dr. Orvan Falconer and is now on oral Keflex and Flagyl. She seems to be tolerating these well 5/10; she had her PICC line removed. The wound on the right foot after some initial improvement has not made any improvement even with the total contact cast. Still having a lot of drainage. Likewise the area on the left foot really is not any better either 5/14; in for a cast change today. We will look at the right foot on Monday. If this is not making any progress I am going to try to cast the left foot. 5/17; the wound on the right foot is absolutely no different. I am going to put ointment on this area and change the cast of the left foot. We have been using silver alginate there 5/21; apparently the area on the right foot was better per our intake nurse although I did not see it. We did a standard total contact cast change on the left foot we have been applying silver alginate to the wound  here. 06/10/19-Area on the right foot per dimensions slightly better, the left foot has still some depth applying silver alginate to the wound with TCC 06/13/19-Patient here for Casting to Left foot 6/1; patient had the total contact cast were placed on the left foot. Noted some swelling in her right leg although the dimensions of the wound on the right foot are better. We have been using PolyMem on  the right and silver alginate on the left under the cast 6/7; we continue to make nice progress in both her plantar feet wound. We are putting the total contact cast on the left. Surprisingly in spite of this the area on the right medial foot in the middle of her Charcot deformities actually is making progress as well 07/01/2019 upon evaluation today patient actually appears to be doing quite well with regard to her wounds on the feet compared to when I saw her last that has been quite some time. With that being said I do believe the total contact cast has been beneficial in this left foot and she has a lot of signs of improvement she does have some callus overhanging which is caused a little bit of a ledge and an issue here. I do believe that we may be able to do something to help in that regard. Fortunately otherwise the wound seems to be progressing quite nicely. 6/21; the patient's area on the left foot that we are currently casting is just about closed. The area on the right medial foot has a healthy looking surface but may be not changed as much from last week in terms of surface area. We are using silver alginate on the right and polymen Ag under the cast 6/28; only a small open area on the left foot remains. Using silver alginate under a total contact cast. She has a diabetic shoe I have asked her to bring that for next week where this may be closed. The area on the right foot with a Charcot deformity is also measuring slightly smaller. We are using PolyMem Ag here 7/6; left foot is totally closed.  She does not require a total contact cast. She will graduate to her own diabetic shoe. The area on the right foot with a Charcot deformity measuring about the same. We have been using polymen under compression wraps. She did not do well in this area with a total contact cast 7/13; left foot remains intact. She is skillfully covering this area with foam and using her diabetic shoe. Slight area improvement of the right foot wound. Still rolled senescent looking edges. We have been using polymen 7/19; left foot remains intact. Wound measuring slightly smaller on the right foot in the middle of the Charcot deformity. We have been using PolyMem Ag 7/26; per the patient left foot remains intact. The wound on the right foot looks slightly smaller but dimensions are measuring the same. We have been using polymen Ag 8/9-Patient back after 2 weeks, wound of the right foot again looking slightly smaller, continuing to use PolyMem silver 8/16; patient has made some minor improvements in wound area on the right foot. Using polymen Ag. The left foot remains healed 8/23; continued slight improvement in dimensions. Rolled edges around the wound edge noted. We have been using polymen Ag. Patient offloading with a surgical shoe and crutches Electronic Signature(s) Signed: 09/09/2019 4:33:08 PM By: Baltazar Najjar MD Entered By: Baltazar Najjar on 09/09/2019 14:13:47 -------------------------------------------------------------------------------- Physical Exam Details Patient Name: Date of Service: [name], [name] 09/09/2019 1:45 PM Medical Record Number: [id number] Patient Account Number: [id number] Date of Birth/Sex: Treating RN: October 19, 1955 (64 y.o. [name] Primary Care Provider: MA SO UDI, [place] MA LSA DA T Other Clinician: Referring Provider: Treating Provider/Extender: Baltazar Najjar MA SO UDI, ELHA MA LSA DA T Weeks in Treatment: 32 Constitutional Sitting or standing Blood Pressure is  within target range for patient.. Pulse regular and within target  range for patient.Marland Kitchen Respirations regular, non-labored and within target range.. Temperature is normal and within the target range for the patient.Marland Kitchen Appears in no distress. Notes Wound exam; right foot. Raised edges around the wound required debridement with an open curette. Hemostasis with direct pressure. The wound surface looks satisfactory Electronic Signature(s) Signed: 09/09/2019 4:33:08 PM By: Baltazar Najjar MD Signed: 09/09/2019 4:33:08 PM By: Baltazar Najjar MD Entered By: Baltazar Najjar on 09/09/2019 14:15:16 -------------------------------------------------------------------------------- Physician Orders Details Patient Name: Date of Service: [name], [name] 09/09/2019 1:45 PM Medical Record Number: [id number] Patient Account Number: [id number] Date of Birth/Sex: Treating RN: 02/18/1955 (64 y.o. 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 Dressing Change Frequency Wound #9 Right,Medial Foot Do not change entire dressing for one week. Wound Cleansing Wound #9 Right,Medial Foot May shower with protection. - use cast protector Primary Wound Dressing Wound #9 Right,Medial Foot Polymem Silver Secondary Dressing Wound #9 Right,Medial Foot Foam - foam donut ABD pad Zetuvit or  Kerramax Edema Control 3 Layer Compression System - Right Lower Extremity Avoid standing for long periods of time Elevate legs to the level of the heart or above for 30 minutes daily and/or when sitting, a frequency of: - throughout the day Exercise regularly Off-Loading Open toe surgical shoe to: - right foot Electronic Signature(s) Signed: 09/09/2019 4:21:43 PM By: Zandra Abts RN, BSN Signed: 09/09/2019 4:33:08 PM By: Baltazar Najjar MD Entered By: Zandra Abts on 09/09/2019 14:11:55 -------------------------------------------------------------------------------- Problem List Details Patient Name: Date of Service: [name], [name] 09/09/2019 1:45 PM Medical Record Number: [id number] Patient Account Number: [id number] Date of Birth/Sex: Treating RN: January 03, 1956 (64 y.o. [name] Primary Care Provider: MA SO UDI, Judene Companion MA LSA DA T Other Clinician: Referring Provider: Treating Provider/Extender: Abe People, ELHA MA LSA DA T Weeks in Treatment: 84 Active Problems ICD-10 Encounter Code Description Active Date MDM Diagnosis E11.621 Type 2 diabetes mellitus with foot ulcer 01/28/2019 No Yes L97.511 Non-pressure chronic ulcer of other part of right foot limited to breakdown of 01/28/2019 No Yes skin E11.42 Type 2 diabetes mellitus with diabetic polyneuropathy 01/28/2019 No Yes M14.671 Charcot's joint, right ankle and foot 01/28/2019 No Yes M14.672 Charcot's joint, left ankle and foot 01/28/2019 No Yes 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, left ankle and foot 04/08/2019 No Yes Inactive Problems ICD-10 Code Description Active Date Inactive Date L97.524 Non-pressure chronic ulcer of other part of left foot with necrosis of bone 01/28/2019 01/28/2019 L97.514 Non-pressure chronic ulcer of other part of right foot with necrosis of bone 01/28/2019 01/28/2019 Resolved Problems Electronic Signature(s) Signed: 09/09/2019 4:33:08 PM By: Baltazar Najjar MD Entered By:  Baltazar Najjar on 09/09/2019 14:12:49 -------------------------------------------------------------------------------- Progress Note Details Patient Name: Date of Service: [name], [name] 09/09/2019 1:45 PM Medical Record Number: [id number] Patient Account Number: [id number] Date of Birth/Sex: Treating RN: 1955/06/18 (64 y.o. [name] Primary Care Provider: MA SO UDI, ELHA MA LSA DA T Other Clinician:  Referring Provider: Treating Provider/Extender: Baltazar Najjar MA SO UDI, ELHA MA LSA DA T Weeks in Treatment: 32 Subjective History of Present Illness (HPI) 64 yrs old bf here for follow up recurrent left charcot foot ulcer. She has DM. She is not a smoker. She states a blister developed 3 months ago at site of previous breakdown from 1 year ago. It was debrided at the podiatrist's office. She went to the ER and then sent here. She denies pain. Xray was negative for osteo. Culture from this clinic negative. 09/08/14 Referred by [name] to [name] for Charcot foot. Seen by him and MRI scheduled 09/19/14. Prior silver collagen but this was not ordered last visit, patient has been rinsing with saline alone. Re institute silver collagen every other day. F/u 2 weeks with [name] as Labor Day holiday. Supplies ordered. 09/25/14; this is a patient with a Charcot foot on the left. she has a wound over the plantar aspect of her left calcaneus. She has been seen by [name] of orthopedic surgery and has had an MRI. She is apparently going within the next week or 2 for corrective surgery which will involve an external fixator. I don't have any of the details of this. 10/06/14; The patient is a 64 yrs old bf with a left Charcot foot and ulcer on the plantar. 12/01/14 Returns post surgery with [name]. Has follow up visit later this week with him, currently in facility and NWB over this extremity. States no drainage from foot and no current wound care. On exam callus and scab in  place, suspect healed. Has external fixator in place. Will defer to [name] for management, ok to place moisturizing cream over scabs and callus and she will call if she requires follow up here. READMISSION 01/28/2019 Patient was in the clinic in 2016 for a wound on the plantar aspect of her left calcaneus. Predominantly cared for by [name] she was referred to [name] and had corrective surgery for the position of her left ankle I believe. She had previously been here in 2015 and then prior to that in 2011 2012 that I do not have these records. More recently she has developed fairly extensive wounds on the right medial foot, left lateral foot and a small area on the right medial great toe. Right medial foot wound has been there for 6 to 7 months, left foot wound for 2 to 3 months and the right great toe only over the last few weeks. She has been followed by [name] at Dr. Laverta [place] office it sounds as though she has been undergoing callus paring and silver nitrate. The patient has been washing these off with soap and water and plan applying dry dressing Past medical history, type 2 diabetes well controlled with a recent hemoglobin A1c of 7 on 11/16, type 2 diabetes with peripheral neuropathy, previous left Charcot joint surgery bilateral Charcot joints currently, stage III chronic renal failure, hypertension, obstructive sleep apnea, history of MRSA and gastroesophageal reflux. 1/18; this is a patient with bilateral Charcot feet. Plain x-rays that I did of both of these wounds that are either at bone or precariously close to bone were done. On the right foot this showed possible lytic destruction involving the anterior aspect of the talus which may represent a wound osteomyelitis. An MRI was recommended. On the left there was no definite lytic destruction although the wound is deep undermining's and I think probably requires an MRI on this basis in  any case 1/25; patient was  supposed to have her MRI last Friday of her bilateral feet however they would not do it because there was silver alginate in her dressings, will replace with calcium alginate today and will see if we can get her done today 2/1; patient did not get her MRIs done last week because of issues with the MRI department receiving orders to do bilateral feet. She has 2 deep wounds in the setting of Charcot feet deformities bilaterally. Both of them at one point had exposed bone although I had trouble demonstrating that today. I am not sure that we can offload these very aggressively as I watched her leave the clinic last week her gait is very narrow and unsteady. 2/15; the MRI of her right foot did not show osteomyelitis potentially osteonecrosis of the talus. 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 The wounds are a large wound on the right medial foot and on the left a substantial wound on the left lateral plantar calcaneus. We have been using silver alginate. Ultimately I think we need to consider a total contact cast on the right while we work through the possibility of osteomyelitis in the left. I watched her walk out with her crutches I have some trepidation about the ability to walk with the cast on her right foot. Nevertheless with her Charcot deformities I do not think there is a way to heal these short of offloading them aggressively. 2/22; the culture of the bone in the left foot that I did last week showed a few Citrobacter Koseri as well as some streptococcal species. In my attempted bone for pathology did not actually yield a lot of bone the pathologist did not identify any osteomyelitis. 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 Is promised today and aggressive debridement of the right foot and a total  contact cast which surprisingly she seemed to tolerate quite well 03/13/2019 upon evaluation today patient appears to be doing well with her total contact cast she is here for the first cast change. She had no areas of rubbing or any other complications at this point. Overall things are doing quite well. 2/26; the patient was kindly seen by Dr. Orvan Falconer of infectious disease on 2/25. She is now on IV vancomycin PICC line placement in 2 days and oral Flagyl. This was in response to her MRI showing osteomyelitis of the left calcaneus concerning for osteomyelitis. We have been putting her right leg in a total contact cast. Our nurses report drainage. She is tolerating the total contact cast well. 3/4; the patient is started her IV antibiotics and is taking IV vancomycin and oral Flagyl. She appears to be tolerating these both well. This is for osteomyelitis involving the left calcaneus. The area on the right in the setting of bilateral Charcot deformities did not have any bone infection on MRI. We put a total contact cast on her with silver alginate as the primary dressing and she returns today in follow-up. Per our intake nurse there was too much drainage to leave this on all week therefore we will bring her back for an early change 3/8; follow-up total contact cast she is still having a lot of drainage probably too much drainage from the right foot wound will week with the same cast. She will  be back on Thursday. The area on the left looks somewhat better 3/11; patient here for a total contact cast change. 3/15; patient came in for wound care evaluation. She is still on IV vancomycin and oral Flagyl for osteomyelitis in the left foot. The area on the right foot we are putting in a total contact cast. 3/18 for total contact cast change on the right foot 3/22; the patient was here for wound review today. The area on the right foot is slightly smaller in terms of surface area. However the surface of the  wound is very gritty and hyper granulated. More problematically the area on the plantar left foot has increased depth indeed in the center of this it probes to bone. We have been using Hydrofera Blue in both areas. She is on antibiotics as prescribed by infectious disease IV vancomycin and oral Flagyl 3/25; the patient is in for total contact cast change. Our intake nurse was concerned about the amount of drainage which soaked right to the multiple layers we are putting on this. Wondered about options. The wound bed actually looks as healthy as this is looked although there may not be a lot of change in dimensions things are looking a lot better. If we are going to heal this woman's wound which is in the middle of her right Charcot foot this is going to need to be offloaded. There are not many alternatives 3/29; still not a lot of improvement although the surface of the right wound looks better. We have been using Hydrofera Blue 04/17/2019 upon evaluation today patient appears to be doing well with a total contact cast. With actually having to change this twice a week. She saw [name] for wound care earlier in the week this is for the cast change today. That is due to the fact that she has significant drainage. Overall the wound is been looking excellent however. 4/5; we continue to put a total contact cast on this patient with Hydrofera Blue. Still a lot of drainage which precludes a simple weekly change or changing this twice a week. 4/8; total contact cast changed today. Apparently the wound looks better per our intake nurse 4/12; patient here for wound care evaluation. Wounds on the right foot have not changed in dimension none about a month left foot looks similar. Apparently her antibiotics that we are giving her for osteomyelitis in the left foot complete on Wednesday. We have been using Hydrofera Blue on the right foot under a total contact cast. Nurses report greenish drainage although I  think this may be discoloration from the [name] 4/15; back for cast change wounds look quite good although still moderate amount of drainage. T contact cast reapplied otal 4/19;. Wound evaluation. Wound on the right foot is smaller surface looks healthy. There is still the divot in the middle part of this wound but I could not feel any bone. Area on the left foot also looks better She has completed her antibiotics but still has the PICC line in. 4/23; patient comes back for a total contact cast change this was done in the standard fashion no issues 4/26; wound measures slightly larger on the right foot which is disappointing. She sees Dr. Orvan Falconer with regards to her antibiotics on Wednesday. I believe she is on vancomycin IV and oral Flagyl. 4/29; in for her routine total contact cast change. She saw Dr. Orvan Falconer this morning. He is asking about antibiotic continuation I will be in touch with him. I  did not look at her wound today 5/3; patient saw Dr. Orvan Falconer on 4/28. I did send him a note asking about the continuation of perhaps an oral regimen. I have not heard back. She still has an elevated sedimentation rate at 81. Her wounds are smaller. 5/6; in for her total contact cast change. We did not look at the wound today. She has seen Dr. Orvan Falconer and is now on oral Keflex and Flagyl. She seems to be tolerating these well 5/10; she had her PICC line removed. The wound on the right foot after some initial improvement has not made any improvement even with the total contact cast. Still having a lot of drainage. Likewise the area on the left foot really is not any better either 5/14; in for a cast change today. We will look at the right foot on Monday. If this is not making any progress I am going to try to cast the left foot. 5/17; the wound on the right foot is absolutely no different. I am going to put ointment on this area and change the cast of the left foot. We have been using silver  alginate there 5/21; apparently the area on the right foot was better per our intake nurse although I did not see it. We did a standard total contact cast change on the left foot we have been applying silver alginate to the wound here. 06/10/19-Area on the right foot per dimensions slightly better, the left foot has still some depth applying silver alginate to the wound with TCC 06/13/19-Patient here for Casting to Left foot 6/1; patient had the total contact cast were placed on the left foot. Noted some swelling in her right leg although the dimensions of the wound on the right foot are better. We have been using PolyMem on the right and silver alginate on the left under the cast 6/7; we continue to make nice progress in both her plantar feet wound. We are putting the total contact cast on the left. Surprisingly in spite of this the area on the right medial foot in the middle of her Charcot deformities actually is making progress as well 07/01/2019 upon evaluation today patient actually appears to be doing quite well with regard to her wounds on the feet compared to when I saw her last that has been quite some time. With that being said I do believe the total contact cast has been beneficial in this left foot and she has a lot of signs of improvement she does have some callus overhanging which is caused a little bit of a ledge and an issue here. I do believe that we may be able to do something to help in that regard. Fortunately otherwise the wound seems to be progressing quite nicely. 6/21; the patient's area on the left foot that we are currently casting is just about closed. The area on the right medial foot has a healthy looking surface but may be not changed as much from last week in terms of surface area. We are using silver alginate on the right and polymen Ag under the cast 6/28; only a small open area on the left foot remains. Using silver alginate under a total contact cast. She has a diabetic  shoe I have asked her to bring that for next week where this may be closed. The area on the right foot with a Charcot deformity is also measuring slightly smaller. We are using PolyMem Ag here 7/6; left foot is totally closed. She  does not require a total contact cast. She will graduate to her own diabetic shoe. ooThe area on the right foot with a Charcot deformity measuring about the same. We have been using polymen under compression wraps. She did not do well in this area with a total contact cast 7/13; left foot remains intact. She is skillfully covering this area with foam and using her diabetic shoe. Slight area improvement of the right foot wound. Still rolled senescent looking edges. We have been using polymen 7/19; left foot remains intact. Wound measuring slightly smaller on the right foot in the middle of the Charcot deformity. We have been using PolyMem Ag 7/26; per the patient left foot remains intact. The wound on the right foot looks slightly smaller but dimensions are measuring the same. We have been using polymen Ag 8/9-Patient back after 2 weeks, wound of the right foot again looking slightly smaller, continuing to use PolyMem silver 8/16; patient has made some minor improvements in wound area on the right foot. Using polymen Ag. The left foot remains healed 8/23; continued slight improvement in dimensions. Rolled edges around the wound edge noted. We have been using polymen Ag. Patient offloading with a surgical shoe and crutches Objective Constitutional Sitting or standing Blood Pressure is within target range for patient.. Pulse regular and within target range for patient.Marland Kitchen Respirations regular, non-labored and within target range.. Temperature is normal and within the target range for the patient.Marland Kitchen Appears in no distress. Vitals Time Taken: 1:45 PM, Height: 68 in, Weight: 265 lbs, BMI: 40.3, Temperature: 97.8 F, Pulse: 72 bpm, Respiratory Rate: 18 breaths/min, Blood  Pressure: 121/65 mmHg. General Notes: Wound exam; right foot. Raised edges around the wound required debridement with an open curette. Hemostasis with direct pressure. The wound surface looks satisfactory Integumentary (Hair, Skin) Wound #9 status is Open. Original cause of wound was Blister. The wound is located on the Right,Medial Foot. The wound measures 1.4cm length x 1.5cm width x 0.5cm depth; 1.649cm^2 area and 0.825cm^3 volume. There is Fat Layer (Subcutaneous Tissue) exposed. There is no tunneling or undermining noted. There is a medium amount of serosanguineous drainage noted. The wound margin is thickened. There is large (67-100%) red granulation within the wound bed. There is no necrotic tissue within the wound bed. 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 There was a Excisional Skin/Subcutaneous Tissue Debridement with a total area of 2.1 sq cm performed by [name], MD. With the following instrument(s): Curette to remove Non-Viable tissue/material. Material removed includes Callus, Subcutaneous Tissue, and Skin: Epidermis. No specimens were taken. A time out was conducted at 14:08, prior to the start of the procedure. A Minimum amount of bleeding was controlled with Pressure. The procedure was tolerated well with a pain level of 0 throughout and a pain level of 0 following the procedure. Post Debridement Measurements: 1.4cm length x 1.5cm width x 0.5cm depth; 0.825cm^3  volume. Character of Wound/Ulcer Post Debridement is improved. Severity of Tissue Post Debridement is: Fat layer exposed. Post procedure Diagnosis Wound #9: Same as Pre-Procedure Pre-procedure diagnosis of  Wound #9 is a Diabetic Wound/Ulcer of the Lower Extremity located on the Right,Medial Foot . There was a Three Layer Compression Therapy Procedure by Zandra Abts, RN. Post procedure Diagnosis Wound #9: Same as Pre-Procedure Plan Follow-up Appointments: Return Appointment in 1 week. Dressing Change Frequency: Wound #9 Right,Medial Foot: Do not change entire dressing for one week. Wound Cleansing: Wound #9 Right,Medial Foot: May shower with protection. - use cast protector Primary Wound Dressing: Wound #9 Right,Medial Foot: Polymem Silver Secondary Dressing: Wound #9 Right,Medial Foot: Foam - foam donut ABD pad Zetuvit or Kerramax Edema Control: 3 Layer Compression System - Right Lower Extremity Avoid standing for long periods of time Elevate legs to the level of the heart or above for 30 minutes daily and/or when sitting, a frequency of: - throughout the day Exercise regularly Off-Loading: Open toe surgical shoe to: - right foot #1 I continued using the polymen Ag. Careful careful attention to wound dimensions next time also the circumference Electronic Signature(s) Signed: 09/09/2019 4:33:08 PM By: Baltazar Najjar MD Entered By: Baltazar Najjar on 09/09/2019 14:15:58 -------------------------------------------------------------------------------- SuperBill Details Patient Name: Date of Service: [name], [name] 09/09/2019 Medical Record Number: [id number] Patient Account Number: [id number] Date of Birth/Sex: Treating RN: [birth year] (64 y.o. [name] Primary Care Provider: MA SO UDI, [place] MA LSA DA T Other Clinician: Referring Provider: Treating Provider/Extender: Abe People, ELHA MA LSA DA T Weeks in Treatment: 32 Diagnosis Coding ICD-10  Codes Code Description E11.621 Type 2 diabetes mellitus with foot ulcer L97.511 Non-pressure chronic ulcer of other part of right foot limited to breakdown of skin E11.42 Type 2 diabetes mellitus with diabetic polyneuropathy M14.671 Charcot's joint, right ankle and foot M14.672 Charcot's joint, left ankle and foot 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, left ankle and foot Facility Procedures CPT4 Code: [id number] Description: 11042 - DEB SUBQ TISSUE 20 SQ CM/< ICD-10 Diagnosis Description L97.511 Non-pressure chronic ulcer of other part of right foot limited to breakdown of E11.621 Type 2 diabetes mellitus with foot ulcer Modifier: skin Quantity: 1 Physician Procedures : CPT4 Code Description Modifier [id number] 11042 - WC PHYS SUBQ TISS 20 SQ CM ICD-10 Diagnosis Description L97.511 Non-pressure chronic ulcer of other part of right foot limited to breakdown of skin E11.621 Type 2 diabetes mellitus with foot ulcer Quantity: 1 Electronic Signature(s) Signed: 09/09/2019 4:33:08 PM By: Baltazar Najjar MD Entered By: Baltazar Najjar on 09/09/2019 14:16:15

## 2019-09-10 NOTE — Progress Notes (Signed)
[name], [name] ([id number]) Visit Report for 08/19/2019 SuperBill Details Patient Name: Date of Service: [name], [name] 08/19/2019 Medical Record Number: [id number] Patient Account Number: [id number] Date of Birth/Sex: Treating RN: Sep 28, 1955 (64 y.o. F) Dwiggins, Shannon Primary Care Provider: MA SO UDI, [place] MA LSA DA T Other Clinician: Referring Provider: Treating Provider/Extender: Abe People, ELHA MA LSA DA T Weeks in Treatment: 29 Diagnosis Coding ICD-10 Codes Code Description E11.621 Type 2 diabetes mellitus with foot ulcer L97.511 Non-pressure chronic ulcer of other part of right foot limited to breakdown of skin E11.42 Type 2 diabetes mellitus with diabetic polyneuropathy M14.671 Charcot's joint, right ankle and foot M14.672 Charcot's joint, left ankle and foot 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, left ankle and foot Facility Procedures CPT4 Code Description Modifier Quantity [id number] (Facility Use Only) 97282SU - APPLY MULTLAY COMPRS LWR RT LEG 1 Electronic Signature(s) Signed: 08/19/2019 12:26:20 PM By: [name] Signed: 09/09/2019 4:33:08 PM By: Baltazar Najjar MD Entered By: [name] on 08/19/2019 11:15:41

## 2019-09-14 NOTE — Progress Notes (Signed)
[name], [name] ([id number]) Visit Report for 09/09/2019 Arrival Information Details Patient [name]: Date of Service: [name], [name] 09/09/2019 1:45 PM Medical Record Number: [id number] Patient Account Number: [id number] Date of Birth/Sex: Treating RN: 03/17/1955 (64 y.o. F) Epps, Carrie Primary Care Claudett Bayly: MA SO UDI, [place] MA LSA DA T Other Clinician: Referring Siddh Vandeventer: Treating Maxxwell Edgett/Extender: Baltazar Najjar MA SO UDI, ELHA MA LSA DA T Weeks in Treatment: 32 Visit Information History Since Last Visit All ordered tests and consults were completed: No Patient Arrived: Crutches Added or deleted any medications: No Arrival Time: 13:44 Any new allergies or adverse reactions: No Accompanied By: self Had a fall or experienced change in No Transfer Assistance: None activities of daily living that may affect Patient Identification Verified: Yes risk of falls: Secondary Verification Process Completed: Yes Signs or symptoms of abuse/neglect since last visito No Patient Requires Transmission-Based Precautions: No Hospitalized since last visit: No Patient Has Alerts: No Implantable device outside of the clinic excluding No cellular tissue based products placed in the [name] since last visit: Has Dressing in Place as Prescribed: Yes Has Compression in Place as Prescribed: Yes Pain Present Now: No Electronic Signature(s) Signed: 09/13/2019 5:49:17 PM By: Yevonne Pax RN Entered By: Yevonne Pax on 09/09/2019 13:44:55 -------------------------------------------------------------------------------- Compression Therapy Details Patient [name]: Date of Service: MADELENE, KAATZ 09/09/2019 1:45 PM Medical Record Number: [id number] Patient Account Number: [id number] Date of Birth/Sex: Treating RN: 28-Feb-1955 (64 y.o. [name] Primary Care Concepcion Gillott: MA SO UDI, Judene Companion MA LSA DA T Other Clinician: Referring Bernell Sigal: Treating Dola Lunsford/Extender: Baltazar Najjar MA SO UDI, ELHA  MA LSA DA T Weeks in Treatment: 32 Compression Therapy Performed for Sarah Assessment: Sarah #9 Right,Medial Foot Performed By: Clinician Zandra Abts, RN Compression Type: Three Layer Post Procedure Diagnosis Same as Pre-procedure Electronic Signature(s) Signed: 09/09/2019 4:21:43 PM By: Zandra Abts RN, BSN Entered By: Zandra Abts on 09/09/2019 14:10:26 -------------------------------------------------------------------------------- Encounter Discharge Information Details Patient [name]: Date of Service: [name], [name] 09/09/2019 1:45 PM Medical Record Number: [id number] Patient Account Number: [id number] Date of Birth/Sex: Treating RN: [birth year] (64 y.o. [name] Primary Care Kristina Bertone: MA SO UDI, [place] MA LSA DA T Other Clinician: Referring Graesyn Schreifels: Treating Gerline Ratto/Extender: Baltazar Najjar MA SO UDI, ELHA MA LSA DA T Weeks in Treatment: 31 Encounter Discharge Information Items Post Procedure Vitals Discharge Condition: Stable Temperature (F): 97.8 Ambulatory Status: Crutches Pulse (bpm): 72 Discharge Destination: Home Respiratory Rate (breaths/min): 18 Transportation: Other Blood Pressure (mmHg): 121/65 Accompanied By: self Schedule Follow-up Appointment: Yes Clinical Summary of Care: Patient Declined Notes SCAT Electronic Signature(s) Signed: 09/09/2019 4:21:44 PM By: Zenaida Deed RN, BSN Entered By: Zenaida Deed on 09/09/2019 14:33:15 -------------------------------------------------------------------------------- Lower Extremity Assessment Details Patient [name]: Date of Service: [name], [name] 09/09/2019 1:45 PM Medical Record Number: [id number] Patient Account Number: [id number] Date of Birth/Sex: Treating RN: 06-24-1955 (64 y.o. [name] Primary Care Cherell Colvin: MA SO UDI, [place] MA LSA DA T Other Clinician: Referring Camauri Fleece: Treating Treesa Mccully/Extender: Baltazar Najjar MA SO UDI, ELHA MA LSA DA T Weeks in Treatment:  32 Edema Assessment Assessed: [Left: No] [Right: No] Edema: [Left: Ye] [Right: s] Calf Left: Right: Point of Measurement: 43 cm From Medial Instep cm 35 cm Ankle Left: Right: Point of Measurement: 12 cm From Medial Instep cm 23 cm Electronic Signature(s) Signed: 09/13/2019 5:49:17 PM By: Yevonne Pax RN Entered By: Yevonne Pax on 09/09/2019 13:52:14 -------------------------------------------------------------------------------- Multi Sarah Chart Details Patient [name]: Date of Service: [name], [name] 09/09/2019 1:45  PM Medical Record Number: [id number] Patient Account Number: [id number] Date of Birth/Sex: Treating RN: January 05, 1956 (64 y.o. [name] Primary Care Dia Donate: MA SO UDI, [place] MA LSA DA T Other Clinician: Referring Chaia Ikard: Treating Hang Ammon/Extender: Baltazar Najjar MA SO UDI, ELHA MA LSA DA T Weeks in Treatment: 32 Vital Signs Height(in): 68 Pulse(bpm): 72 Weight(lbs): 265 Blood Pressure(mmHg): 121/65 Body Mass Index(BMI): 40 Temperature(F): 97.8 Respiratory Rate(breaths/min): 18 Photos: [9:No Photos Right, Medial Foot] [N/A:N/A N/A] Sarah Location: [9:Blister] [N/A:N/A] Wounding Event: [9:Diabetic Sarah/Ulcer of the Lower] [N/A:N/A] Primary Etiology: [9:Extremity Glaucoma, Chronic sinus] [N/A:N/A] Comorbid History: [9:problems/congestion, Anemia, Sleep Apnea, Hypertension, Type II Diabetes, Rheumatoid Arthritis, Neuropathy 06/18/2018] [N/A:N/A] Date Acquired: [9:32] [N/A:N/A] Weeks of Treatment: [9:Open] [N/A:N/A] Sarah Status: [9:1.4x1.5x0.5] [N/A:N/A] Measurements L x W x D (cm) [9:1.649] [N/A:N/A] A (cm) : rea [9:0.825] [N/A:N/A] Volume (cm) : [9:85.00%] [N/A:N/A] % Reduction in A [9:rea: 91.70%] [N/A:N/A] % Reduction in Volume: [9:Grade 2] [N/A:N/A] Classification: [9:Medium] [N/A:N/A] Exudate A mount: [9:Serosanguineous] [N/A:N/A] Exudate Type: [9:red, brown] [N/A:N/A] Exudate Color: [9:Thickened] [N/A:N/A] Sarah Margin: [9:Large  (67-100%)] [N/A:N/A] Granulation A mount: [9:Red] [N/A:N/A] Granulation Quality: [9:None Present (0%)] [N/A:N/A] Necrotic A mount: [9:Fat Layer (Subcutaneous Tissue): Yes N/A] Exposed Structures: [9:Fascia: No Tendon: No Muscle: No Joint: No Bone: No Small (1-33%)] [N/A:N/A] Epithelialization: [9:Debridement - Excisional] [N/A:N/A] Debridement: Pre-procedure Verification/Time Out 14:08 [N/A:N/A] Taken: [9:Callus, Subcutaneous] [N/A:N/A] Tissue Debrided: [9:Skin/Subcutaneous Tissue] [N/A:N/A] Level: [9:2.1] [N/A:N/A] Debridement A (sq cm): [9:rea Curette] [N/A:N/A] Instrument: [9:Minimum] [N/A:N/A] Bleeding: [9:Pressure] [N/A:N/A] Hemostasis A chieved: [9:0] [N/A:N/A] Procedural Pain: [9:0] [N/A:N/A] Post Procedural Pain: [9:Procedure was tolerated well] [N/A:N/A] Debridement Treatment Response: [9:1.4x1.5x0.5] [N/A:N/A] Post Debridement Measurements L x W x D (cm) [9:0.825] [N/A:N/A] Post Debridement Volume: (cm) [9:Compression Therapy] [N/A:N/A] Procedures Performed: [9:Debridement] Treatment Notes Electronic Signature(s) Signed: 09/09/2019 4:21:43 PM By: Zandra Abts RN, BSN Signed: 09/09/2019 4:33:08 PM By: Baltazar Najjar MD Entered By: Baltazar Najjar on 09/09/2019 14:12:58 -------------------------------------------------------------------------------- Multi-Disciplinary Care Plan Details Patient [name]: Date of Service: [name], [name] 09/09/2019 1:45 PM Medical Record Number: [id number] Patient Account Number: [id number] Date of Birth/Sex: Treating RN: 03-30-1955 (64 y.o. [name] Primary Care Mikie Misner: MA SO UDI, Judene Companion MA LSA DA T Other Clinician: Referring Keenan Trefry: Treating Shaunita Seney/Extender: Baltazar Najjar MA SO UDI, ELHA MA LSA DA T Weeks in Treatment: 44 Active Inactive Sarah/Skin Impairment Nursing Diagnoses: Impaired tissue integrity Knowledge deficit related to ulceration/compromised skin integrity Goals: Patient/caregiver will verbalize  understanding of skin care regimen Date Initiated: 01/28/2019 Target Resolution Date: 10/04/2019 Goal Status: Active Interventions: Assess patient/caregiver ability to obtain necessary supplies Assess patient/caregiver ability to perform ulcer/skin care regimen upon admission and as needed Assess ulceration(s) every visit Provide education on ulcer and skin care Notes: Electronic Signature(s) Signed: 09/09/2019 4:21:43 PM By: Zandra Abts RN, BSN Entered By: Zandra Abts on 09/09/2019 15:41:21 -------------------------------------------------------------------------------- Pain Assessment Details Patient [name]: Date of Service: [name], [name] 09/09/2019 1:45 PM Medical Record Number: [id number] Patient Account Number: [id number] Date of Birth/Sex: Treating RN: 1955/08/17 (64 y.o. [name] Primary Care Severina Sykora: MA SO UDI, [place] MA LSA DA T Other Clinician: Referring Atiana Levier: Treating Lowell Mcgurk/Extender: Baltazar Najjar MA SO UDI, ELHA MA LSA DA T Weeks in Treatment: 65 Active Problems Location of Pain Severity and Description of Pain Patient Has Paino No Site Locations Pain Management and Medication Current Pain Management: Electronic Signature(s) Signed: 09/13/2019 5:49:17 PM By: Yevonne Pax RN Entered By: Yevonne Pax on 09/09/2019 13:46:11 -------------------------------------------------------------------------------- Patient/Caregiver Education Details Patient [name]: Date of Service: [name], [name] 8/23/2021andnbsp1:45 PM  Medical Record Number: [id number] Patient Account Number: [id number] Date of Birth/Gender: Treating RN: April 19, 1955 (64 y.o. [name] Primary Care Physician: MA SO UDI, Judene Companion MA LSA DA T Other Clinician: Referring Physician: Treating Physician/Extender: Baltazar Najjar MA SO UDI, ELHA MA LSA DA T Weeks in Treatment: 50 Education Assessment Education Provided To: Patient Education Topics Provided Sarah/Skin  Impairment: Methods: Explain/Verbal Responses: State content correctly [employer]) Signed: 09/09/2019 4:21:43 PM By: Zandra Abts RN, BSN Entered By: Zandra Abts on 09/09/2019 15:41:35 -------------------------------------------------------------------------------- [name]: Date of Service: [name], [name] 09/09/2019 1:45 PM Medical Record Number: [id number] Patient Account Number: [id number] Date of Birth/Sex: Treating RN: Aug 19, 1955 (64 y.o. [name] Primary Care Hugh Garrow: MA SO UDI, [place] MA LSA DA T Other Clinician: Referring Yeny Schmoll: Treating Giannah Zavadil/Extender: Baltazar Najjar MA SO UDI, ELHA MA LSA DA T Weeks in Treatment: 32 Sarah Status Sarah Number: 9 Primary Diabetic Sarah/Ulcer of the Lower Extremity Etiology: Sarah Location: Right, Medial Foot Sarah Open Wounding Event: Blister Status: Date Acquired: 06/18/2018 Comorbid Glaucoma, Chronic sinus problems/congestion, Anemia, Sleep Weeks Of Treatment: 32 History: Apnea, Hypertension, Type II Diabetes, Rheumatoid Arthritis, Clustered Sarah: No Neuropathy Photos Photo Uploaded By: Benjaman Kindler on 09/10/2019 12:18:22 Sarah Measurements Length: (cm) 1.4 Width: (cm) 1.5 Depth: (cm) 0.5 Area: (cm) 1.649 Volume: (cm) 0.825 % Reduction in Area: 85% % Reduction in Volume: 91.7% Epithelialization: Small (1-33%) Tunneling: No Undermining: No Sarah Description Classification: Grade 2 Sarah Margin: Thickened Exudate Amount: Medium Exudate Type: Serosanguineous Exudate Color: red, brown Foul Odor After Cleansing: No Slough/Fibrino No Sarah Bed Granulation Amount: Large (67-100%) Exposed Structure Granulation Quality: Red Fascia Exposed: No Necrotic Amount: None Present (0%) Fat Layer (Subcutaneous Tissue) Exposed: Yes Tendon Exposed: No Muscle Exposed: No Joint Exposed: No Bone Exposed: No Treatment Notes Sarah #9 (Right, Medial Foot) 2. Periwound  Care Bag Balm 3. Primary Dressing Applied Polymem Ag 4. Secondary Dressing Dry Gauze Foam Other secondary dressing (specify in notes) 6. Support Layer Applied 3 layer compression wrap Notes zetuvit. Scientist, research (physical sciences)) Signed: 09/13/2019 5:49:17 PM By: Yevonne Pax RN Entered By: Yevonne Pax on 09/09/2019 13:53:54 -------------------------------------------------------------------------------- Vitals Details Patient [name]: Date of Service: [facility] [name], Debanhi J. 09/09/2019 1:45 PM Medical Record Number: [id number] Patient Account Number: [id number] Date of Birth/Sex: Treating RN: [birth year] (64 y.o. Sharyne Peach, Carrie Primary Care Lajoya Dombek: MA SO UDI, [place] MA LSA DA T Other Clinician: Referring Ercilia Bettinger: Treating Nishanth Mccaughan/Extender: Baltazar Najjar MA SO UDI, ELHA MA LSA DA T Weeks in Treatment: 32 Vital Signs Time Taken: 13:45 Temperature (F): 97.8 Height (in): 68 Pulse (bpm): 72 Weight (lbs): 265 Respiratory Rate (breaths/min): 18 Body Mass Index (BMI): 40.3 Blood Pressure (mmHg): 121/65 Reference Range: 80 - 120 mg / dl Electronic Signature(s) Signed: 09/13/2019 5:49:17 PM By: Yevonne Pax RN Entered By: Yevonne Pax on 09/09/2019 13:45:57

## 2019-09-16 ENCOUNTER — Encounter (HOSPITAL_BASED_OUTPATIENT_CLINIC_OR_DEPARTMENT_OTHER): Payer: Medicare Other | Admitting: Internal Medicine

## 2019-09-16 ENCOUNTER — Encounter: Payer: Self-pay | Admitting: Internal Medicine

## 2019-09-16 ENCOUNTER — Ambulatory Visit (INDEPENDENT_AMBULATORY_CARE_PROVIDER_SITE_OTHER): Payer: Medicare Other | Admitting: Internal Medicine

## 2019-09-16 ENCOUNTER — Other Ambulatory Visit: Payer: Self-pay

## 2019-09-16 VITALS — BP 135/65 | HR 68 | Temp 98.4°F | Ht 69.0 in | Wt 243.7 lb

## 2019-09-16 DIAGNOSIS — E11621 Type 2 diabetes mellitus with foot ulcer: Secondary | ICD-10-CM

## 2019-09-16 DIAGNOSIS — E669 Obesity, unspecified: Secondary | ICD-10-CM | POA: Diagnosis not present

## 2019-09-16 DIAGNOSIS — E1169 Type 2 diabetes mellitus with other specified complication: Secondary | ICD-10-CM

## 2019-09-16 DIAGNOSIS — L97529 Non-pressure chronic ulcer of other part of left foot with unspecified severity: Secondary | ICD-10-CM | POA: Diagnosis not present

## 2019-09-16 DIAGNOSIS — I10 Essential (primary) hypertension: Secondary | ICD-10-CM

## 2019-09-16 DIAGNOSIS — Z6835 Body mass index (BMI) 35.0-35.9, adult: Secondary | ICD-10-CM | POA: Diagnosis not present

## 2019-09-16 DIAGNOSIS — Z6838 Body mass index (BMI) 38.0-38.9, adult: Secondary | ICD-10-CM

## 2019-09-16 DIAGNOSIS — L97519 Non-pressure chronic ulcer of other part of right foot with unspecified severity: Secondary | ICD-10-CM

## 2019-09-16 NOTE — Assessment & Plan Note (Signed)
This is a chronic problem. She denies fever. Chills, drainage, or evidence of active infection. She regularly follows at wound care clinic (once a week).  She reports occasional pain of rt foot for past 1-2 weeks. No fever or chills. She is able to walk on her leges using cane. She has an appointment with wound care clinic today. Appreciate recommendations.

## 2019-09-16 NOTE — Progress Notes (Signed)
[name], [name] ([id number]) Visit Report for 09/16/2019 Debridement Details Patient Name: Date of Service: [name], [name] 09/16/2019 1:45 PM Medical Record Number: [id number] Patient Account Number: [id number] Date of Birth/Sex: Treating RN: 11-13-1955 (64 y.o. [name] Primary Care Provider: MA SO UDI, [place] MA LSA DA T Other Clinician: Referring Provider: Treating Provider/Extender: Baltazar Najjar MA SO UDI, ELHA MA LSA DA T Weeks in Treatment: 33 Debridement Performed for Assessment: Wound #9 Right,Medial Foot Performed By: Physician [name], MD Debridement Type: Debridement Severity of Tissue Pre Debridement: Fat layer exposed Level of Consciousness (Pre-procedure): Awake and Alert Pre-procedure Verification/Time Out Yes - 14:38 Taken: Start Time: 14:38 T Area Debrided (L x W): otal 1.5 (cm) x 1.5 (cm) = 2.25 (cm) Tissue and other material debrided: Viable, Non-Viable, Callus, Subcutaneous Level: Skin/Subcutaneous Tissue Debridement Description: Excisional Instrument: Curette Bleeding: Moderate Hemostasis Achieved: Pressure End Time: 14:40 Procedural Pain: 0 Post Procedural Pain: 0 Response to Treatment: Procedure was tolerated well Level of Consciousness (Post- Awake and Alert procedure): Post Debridement Measurements of Total Wound Length: (cm) 1.5 Width: (cm) 1.5 Depth: (cm) 0.6 Volume: (cm) 1.06 Character of Wound/Ulcer Post Debridement: Improved Severity of Tissue Post Debridement: Fat layer exposed Post Procedure Diagnosis Same as Pre-procedure Electronic Signature(s) Signed: 09/16/2019 4:22:50 PM By: Baltazar Najjar MD Signed: 09/16/2019 4:24:12 PM By: Zandra Abts RN, BSN Entered By: Baltazar Najjar on 09/16/2019 15:14:06 -------------------------------------------------------------------------------- HPI Details Patient Name: Date of Service: [name], [name] 09/16/2019 1:45 PM Medical Record Number: [id number] Patient  Account Number: [id number] Date of Birth/Sex: Treating RN: March 14, 1955 (64 y.o. [name] Primary Care Provider: MA SO UDI, [place] MA LSA DA T Other Clinician: Referring Provider: Treating Provider/Extender: Baltazar Najjar MA SO UDI, ELHA MA LSA DA T Weeks in Treatment: 24 History of Present Illness HPI Description: 64 yrs old bf here for follow up recurrent left charcot foot ulcer. She has DM. She is not a smoker. She states a blister developed 3 months ago at site of previous breakdown from 1 year ago. It was debrided at the podiatrist's office. She went to the ER and then sent here. She denies pain. Xray was negative for osteo. Culture from this clinic negative. 09/08/14 [address] [name] to [name] for Charcot foot. Seen by him and MRI scheduled 09/19/14. Prior silver collagen but this was not ordered last visit, patient has been rinsing with saline alone. Re institute silver collagen every other day. F/u 2 weeks with [name] as Labor Day holiday. Supplies ordered. 09/25/14; this is a patient with a Charcot foot on the left. she has a wound over the plantar aspect of her left calcaneus. She has been seen by [name] of orthopedic surgery and has had an MRI. She is apparently going within the next week or 2 for corrective surgery which will involve an external fixator. I don't have any of the details of this. 10/06/14; The patient is a 64 yrs old bf with a left Charcot foot and ulcer on the plantar. 12/01/14 Returns post surgery with [name]. Has follow up visit later this week with him, currently in facility and NWB over this extremity. States no drainage from foot and no current wound care. On exam callus and scab in place, suspect healed. Has external fixator in place. Will defer to [name] for management, ok to place moisturizing cream over scabs and callus and she will call if she requires follow up here. READMISSION 01/28/2019 Patient was in the  clinic in 2016 for a  wound on the plantar aspect of her left calcaneus. Predominantly cared for by [name] she was referred to [name] and had corrective surgery for the position of her left ankle I believe. She had previously been here in 2015 and then prior to that in 2011 2012 that I do not have these records. More recently she has developed fairly extensive wounds on the right medial foot, left lateral foot and a small area on the right medial great toe. Right medial foot wound has been there for 6 to 7 months, left foot wound for 2 to 3 months and the right great toe only over the last few weeks. She has been followed by [name] at Dr. Laverta [place] office it sounds as though she has been undergoing callus paring and silver nitrate. The patient has been washing these off with soap and water and plan applying dry dressing Past medical history, type 2 diabetes well controlled with a recent hemoglobin A1c of 7 on 11/16, type 2 diabetes with peripheral neuropathy, previous left Charcot joint surgery bilateral Charcot joints currently, stage III chronic renal failure, hypertension, obstructive sleep apnea, history of MRSA and gastroesophageal reflux. 1/18; this is a patient with bilateral Charcot feet. Plain x-rays that I did of both of these wounds that are either at bone or precariously close to bone were done. On the right foot this showed possible lytic destruction involving the anterior aspect of the talus which may represent a wound osteomyelitis. An MRI was recommended. On the left there was no definite lytic destruction although the wound is deep undermining's and I think probably requires an MRI on this basis in any case 1/25; patient was supposed to have her MRI last Friday of her bilateral feet however they would not do it because there was silver alginate in her dressings, will replace with calcium alginate today and will see if we can get her done today 2/1; patient did not get her MRIs done last  week because of issues with the MRI department receiving orders to do bilateral feet. She has 2 deep wounds in the setting of Charcot feet deformities bilaterally. Both of them at one point had exposed bone although I had trouble demonstrating that today. I am not sure that we can offload these very aggressively as I watched her leave the clinic last week her gait is very narrow and unsteady. 2/15; the MRI of her right foot did not show osteomyelitis potentially osteonecrosis of the talus. 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 The wounds are a large wound on the right medial foot and on the left a substantial wound on the left lateral plantar calcaneus. We have been using silver alginate. Ultimately I think we need to consider a total contact cast on the right while we work through the possibility of osteomyelitis in the left. I watched her walk out with her crutches I have some trepidation about the ability to walk with the cast on her right foot. Nevertheless with her Charcot deformities I do not think there is a way to heal these short of offloading them aggressively. 2/22; the culture of the bone in the left foot that I did last week showed a few Citrobacter Koseri as well as some streptococcal species. In my attempted bone for pathology did not actually yield a lot of bone the pathologist did not identify any osteomyelitis. Nevertheless based on the MRI, bone for culture  and the probing wound into the bone itself I think this woman has osteomyelitis in the left foot and we will refer her to infectious disease. Is promised today and aggressive debridement of the right foot and a total contact cast which surprisingly she seemed to tolerate quite well 03/13/2019 upon evaluation today patient appears to be doing well with her total contact cast she is here for the first cast change. She had no areas of rubbing or any other complications at this  point. Overall things are doing quite well. 2/26; the patient was kindly seen by Dr. Megan Salon of infectious disease on 2/25. She is now on IV vancomycin PICC line placement in 2 days and oral Flagyl. This was in response to her MRI showing osteomyelitis of the left calcaneus concerning for osteomyelitis. We have been putting her right leg in a total contact cast. Our nurses report drainage. She is tolerating the total contact cast well. 3/4; the patient is started her IV antibiotics and is taking IV vancomycin and oral Flagyl. She appears to be tolerating these both well. This is for osteomyelitis involving the left calcaneus. The area on the right in the setting of bilateral Charcot deformities did not have any bone infection on MRI. We put a total contact cast on her with silver alginate as the primary dressing and she returns today in follow-up. Per our intake nurse there was too much drainage to leave this on all week therefore we will bring her back for an early change 3/8; follow-up total contact cast she is still having a lot of drainage probably too much drainage from the right foot wound will week with the same cast. She will be back on Thursday. The area on the left looks somewhat better 3/11; patient here for a total contact cast change. 3/15; patient came in for wound care evaluation. She is still on IV vancomycin and oral Flagyl for osteomyelitis in the left foot. The area on the right foot we are putting in a total contact cast. 3/18 for total contact cast change on the right foot 3/22; the patient was here for wound review today. The area on the right foot is slightly smaller in terms of surface area. However the surface of the wound is very gritty and hyper granulated. More problematically the area on the plantar left foot has increased depth indeed in the center of this it probes to bone. We have been using Hydrofera Blue in both areas. She is on antibiotics as prescribed by  infectious disease IV vancomycin and oral Flagyl 3/25; the patient is in for total contact cast change. Our intake nurse was concerned about the amount of drainage which soaked right to the multiple layers we are putting on this. Wondered about options. The wound bed actually looks as healthy as this is looked although there may not be a lot of change in dimensions things are looking a lot better. If we are going to heal this woman's wound which is in the middle of her right Charcot foot this is going to need to be offloaded. There are not many alternatives 3/29; still not a lot of improvement although the surface of the right wound looks better. We have been using Hydrofera Blue 04/17/2019 upon evaluation today patient appears to be doing well with a total contact cast. With actually having to change this twice a week. She saw Dr. Dellia Nims for wound care earlier in the week this is for the cast change today. That is due  to the fact that she has significant drainage. Overall the wound is been looking excellent however. 4/5; we continue to put a total contact cast on this patient with Hydrofera Blue. Still a lot of drainage which precludes a simple weekly change or changing this twice a week. 4/8; total contact cast changed today. Apparently the wound looks better per our intake nurse 4/12; patient here for wound care evaluation. Wounds on the right foot have not changed in dimension none about a month left foot looks similar. Apparently her antibiotics that we are giving her for osteomyelitis in the left foot complete on Wednesday. We have been using Hydrofera Blue on the right foot under a total contact cast. Nurses report greenish drainage although I think this may be discoloration from the [facility] 4/15; back for cast change wounds look quite good although still moderate amount of drainage. T contact cast reapplied otal 4/19;. Wound evaluation. Wound on the right foot is smaller surface looks  healthy. There is still the divot in the middle part of this wound but I could not feel any bone. Area on the left foot also looks better She has completed her antibiotics but still has the PICC line in. 4/23; patient comes back for a total contact cast change this was done in the standard fashion no issues 4/26; wound measures slightly larger on the right foot which is disappointing. She sees Dr. Orvan Falconer with regards to her antibiotics on Wednesday. I believe she is on vancomycin IV and oral Flagyl. 4/29; in for her routine total contact cast change. She saw Dr. Orvan Falconer this morning. He is asking about antibiotic continuation I will be in touch with him. I did not look at her wound today 5/3; patient saw Dr. Orvan Falconer on 4/28. I did send him a note asking about the continuation of perhaps an oral regimen. I have not heard back. She still has an elevated sedimentation rate at 81. Her wounds are smaller. 5/6; in for her total contact cast change. We did not look at the wound today. She has seen Dr. Orvan Falconer and is now on oral Keflex and Flagyl. She seems to be tolerating these well 5/10; she had her PICC line removed. The wound on the right foot after some initial improvement has not made any improvement even with the total contact cast. Still having a lot of drainage. Likewise the area on the left foot really is not any better either 5/14; in for a cast change today. We will look at the right foot on Monday. If this is not making any progress I am going to try to cast the left foot. 5/17; the wound on the right foot is absolutely no different. I am going to put ointment on this area and change the cast of the left foot. We have been using silver alginate there 5/21; apparently the area on the right foot was better per our intake nurse although I did not see it. We did a standard total contact cast change on the left foot we have been applying silver alginate to the wound here. 06/10/19-Area on the  right foot per dimensions slightly better, the left foot has still some depth applying silver alginate to the wound with TCC 06/13/19-Patient here for Casting to Left foot 6/1; patient had the total contact cast were placed on the left foot. Noted some swelling in her right leg although the dimensions of the wound on the right foot are better. We have been using PolyMem on the  right and silver alginate on the left under the cast 6/7; we continue to make nice progress in both her plantar feet wound. We are putting the total contact cast on the left. Surprisingly in spite of this the area on the right medial foot in the middle of her Charcot deformities actually is making progress as well 07/01/2019 upon evaluation today patient actually appears to be doing quite well with regard to her wounds on the feet compared to when I saw her last that has been quite some time. With that being said I do believe the total contact cast has been beneficial in this left foot and she has a lot of signs of improvement she does have some callus overhanging which is caused a little bit of a ledge and an issue here. I do believe that we may be able to do something to help in that regard. Fortunately otherwise the wound seems to be progressing quite nicely. 6/21; the patient's area on the left foot that we are currently casting is just about closed. The area on the right medial foot has a healthy looking surface but may be not changed as much from last week in terms of surface area. We are using silver alginate on the right and polymen Ag under the cast 6/28; only a small open area on the left foot remains. Using silver alginate under a total contact cast. She has a diabetic shoe I have asked her to bring that for next week where this may be closed. The area on the right foot with a Charcot deformity is also measuring slightly smaller. We are using PolyMem Ag here 7/6; left foot is totally closed. She does not require a total  contact cast. She will graduate to her own diabetic shoe. The area on the right foot with a Charcot deformity measuring about the same. We have been using polymen under compression wraps. She did not do well in this area with a total contact cast 7/13; left foot remains intact. She is skillfully covering this area with foam and using her diabetic shoe. Slight area improvement of the right foot wound. Still rolled senescent looking edges. We have been using polymen 7/19; left foot remains intact. Wound measuring slightly smaller on the right foot in the middle of the Charcot deformity. We have been using PolyMem Ag 7/26; per the patient left foot remains intact. The wound on the right foot looks slightly smaller but dimensions are measuring the same. We have been using polymen Ag 8/9-Patient back after 2 weeks, wound of the right foot again looking slightly smaller, continuing to use PolyMem silver 8/16; patient has made some minor improvements in wound area on the right foot. Using polymen Ag. The left foot remains healed 8/23; continued slight improvement in dimensions. Rolled edges around the wound edge noted. We have been using polymen Ag. Patient offloading with a surgical shoe and crutches 8/30; no change in surface area. Thick rolled skin and subcutaneous tissue around the edges debris on the wound surface. We have been using polymen Ag initially with some improvement however stalled on the last 2 weeks in terms of surface area Electronic Signature(s) Signed: 09/16/2019 4:22:50 PM By: Baltazar Najjar MD Entered By: Baltazar Najjar on 09/16/2019 15:14:58 -------------------------------------------------------------------------------- Physical Exam Details Patient Name: Date of Service: [name], [name] 09/16/2019 1:45 PM Medical Record Number: [id number] Patient Account Number: [id number] Date of Birth/Sex: Treating RN: July 16, 1955 (65 y.o. [name] Primary Care Provider: MA SO  UDI, [place]ene [place]  MA LSA DA T Other Clinician: Referring Provider: Treating Provider/Extender: Baltazar Najjar MA SO UDI, ELHA MA LSA DA T Weeks in Treatment: 27 Constitutional Sitting or standing Blood Pressure is within target range for patient.. Pulse regular and within target range for patient.Marland Kitchen Respirations regular, non-labored and within target range.. Temperature is normal and within the target range for the patient.Marland Kitchen Appears in no distress. Notes Wound exam; right foot. Raised edges around the wound skin and subcutaneous tissue debris on the wound surface. An aggressive debridement with a #5 curette. I brought the circumference of the wound down to the level of the wound itself and debrided the wound surface. Things look quite good post debridement. No evidence of surrounding infection Electronic Signature(s) Signed: 09/16/2019 4:22:50 PM By: Baltazar Najjar MD Entered By: Baltazar Najjar on 09/16/2019 15:20:46 -------------------------------------------------------------------------------- Physician Orders Details Patient Name: Date of Service: [name], [name] 09/16/2019 1:45 PM Medical Record Number: [id number] Patient Account Number: [id number] Date of Birth/Sex: Treating RN: Jul 31, 1955 (64 y.o. 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 Nurse  Visit: - 1 week for rewrap Dressing Change Frequency Wound #9 Right,Medial Foot Do not change entire dressing for one week. Wound Cleansing Wound #9 Right,Medial Foot May shower with protection. - use cast protector Primary Wound Dressing Wound #9 Right,Medial Foot Polymem Silver Secondary Dressing Wound #9 Right,Medial Foot Foam - foam donut ABD pad Zetuvit or Kerramax Edema Control 3 Layer Compression System - Right Lower Extremity Avoid standing for long periods of time Elevate legs to the level of the heart or above for 30 minutes daily and/or when sitting, a frequency of: - throughout the day Exercise regularly Off-Loading Open toe surgical shoe to: - right foot Electronic Signature(s) Signed: 09/16/2019 4:22:50 PM By: Baltazar Najjar MD Signed: 09/16/2019 4:24:12 PM By: Zandra Abts RN, BSN Entered By: Zandra Abts on 09/16/2019 14:42:40 -------------------------------------------------------------------------------- Problem List Details Patient Name: Date of Service: [name], [name] 09/16/2019 1:45 PM Medical Record Number: [id number] Patient Account Number: [id number] Date of Birth/Sex: Treating RN: 1955/09/07 (64 y.o. [name] Primary Care Provider: MA SO UDI, Judene Companion MA LSA DA T Other Clinician: Referring Provider: Treating Provider/Extender: Abe People, ELHA MA LSA DA T Weeks in Treatment: 77 Active Problems ICD-10 Encounter Code Description Active Date MDM Diagnosis E11.621 Type 2 diabetes mellitus with foot ulcer 01/28/2019 No Yes L97.511 Non-pressure chronic ulcer of other part of right foot limited to breakdown of 01/28/2019 No Yes skin E11.42 Type 2 diabetes mellitus with diabetic polyneuropathy 01/28/2019 No Yes M14.671 Charcot's joint, right ankle and foot 01/28/2019 No Yes M14.672 Charcot's joint, left ankle and foot 01/28/2019 No Yes 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, left ankle and foot 04/08/2019 No Yes Inactive  Problems ICD-10 Code Description Active Date Inactive Date L97.524 Non-pressure chronic ulcer of other part of left foot with necrosis of bone 01/28/2019 01/28/2019 L97.514 Non-pressure chronic ulcer of other part of right foot with  necrosis of bone 01/28/2019 01/28/2019 Resolved Problems Electronic Signature(s) Signed: 09/16/2019 4:22:50 PM By: Baltazar Najjar MD Entered By: Baltazar Najjar on 09/16/2019 15:13:44 -------------------------------------------------------------------------------- Progress Note Details Patient Name: Date of Service: [name], [name] 09/16/2019 1:45 PM Medical Record Number: [id number] Patient Account Number: [id number] Date of Birth/Sex: Treating RN: November 04, 1955 (64 y.o. [name] Primary Care Provider: MA SO UDI, [place] MA LSA DA T Other Clinician: Referring Provider: Treating Provider/Extender: Baltazar Najjar MA SO UDI, ELHA MA LSA DA T Weeks in Treatment: 33 Subjective History of Present Illness (HPI) 64 yrs old bf here for follow up recurrent left charcot foot ulcer. She has DM. She is not a smoker. She states a blister developed 3 months ago at site of previous breakdown from 1 year ago. It was debrided at the podiatrist's office. She went to the ER and then sent here. She denies pain. Xray was negative for osteo. Culture from this clinic negative. 09/08/14 Referred by [name] to [name] for Charcot foot. Seen by him and MRI scheduled 09/19/14. Prior silver collagen but this was not ordered last visit, patient has been rinsing with saline alone. Re institute silver collagen every other day. F/u 2 weeks with [name] as Labor Day holiday. Supplies ordered. 09/25/14; this is a patient with a Charcot foot on the left. she has a wound over the plantar aspect of her left calcaneus. She has been seen by [name] of orthopedic surgery and has had an MRI. She is apparently going within the next week or 2 for corrective surgery which will involve an  external fixator. I don't have any of the details of this. 10/06/14; The patient is a 64 yrs old bf with a left Charcot foot and ulcer on the plantar. 12/01/14 Returns post surgery with [name]. Has follow up visit later this week with him, currently in facility and NWB over this extremity. States no drainage from foot and no current wound care. On exam callus and scab in place, suspect healed. Has external fixator in place. Will defer to [name] for management, ok to place moisturizing cream over scabs and callus and she will call if she requires follow up here. READMISSION 01/28/2019 Patient was in the clinic in 2016 for a wound on the plantar aspect of her left calcaneus. Predominantly cared for by [name] she was referred to [name] and had corrective surgery for the position of her left ankle I believe. She had previously been here in 2015 and then prior to that in 2011 2012 that I do not have these records. More recently she has developed fairly extensive wounds on the right medial foot, left lateral foot and a small area on the right medial great toe. Right medial foot wound has been there for 6 to 7 months, left foot wound for 2 to 3 months and the right great toe only over the last few weeks. She has been followed by [name] at Dr. Laverta [place] office it sounds as though she has been undergoing callus paring and silver nitrate. The patient has been washing these off with soap and water and plan applying dry dressing Past medical history, type 2 diabetes well controlled with a recent hemoglobin A1c of 7 on 11/16, type 2 diabetes with peripheral neuropathy, previous left Charcot joint surgery bilateral Charcot joints currently, stage III chronic renal failure, hypertension, obstructive sleep apnea, history of MRSA and gastroesophageal reflux. 1/18; this is a patient with bilateral Charcot feet. Plain x-rays  that I did of both of these wounds that are either at bone or  precariously close to bone were done. On the right foot this showed possible lytic destruction involving the anterior aspect of the talus which may represent a wound osteomyelitis. An MRI was recommended. On the left there was no definite lytic destruction although the wound is deep undermining's and I think probably requires an MRI on this basis in any case 1/25; patient was supposed to have her MRI last Friday of her bilateral feet however they would not do it because there was silver alginate in her dressings, will replace with calcium alginate today and will see if we can get her done today 2/1; patient did not get her MRIs done last week because of issues with the MRI department receiving orders to do bilateral feet. She has 2 deep wounds in the setting of Charcot feet deformities bilaterally. Both of them at one point had exposed bone although I had trouble demonstrating that today. I am not sure that we can offload these very aggressively as I watched her leave the clinic last week her gait is very narrow and unsteady. 2/15; the MRI of her right foot did not show osteomyelitis potentially osteonecrosis of the talus. 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 The wounds are a large wound on the right medial foot and on the left a substantial wound on the left lateral plantar calcaneus. We have been using silver alginate. Ultimately I think we need to consider a total contact cast on the right while we work through the possibility of osteomyelitis in the left. I watched her walk out with her crutches I have some trepidation about the ability to walk with the cast on her right foot. Nevertheless with her Charcot deformities I do not think there is a way to heal these short of offloading them aggressively. 2/22; the culture of the bone in the left foot that I did last week showed a few Citrobacter Koseri as well as some streptococcal species. In my  attempted bone for pathology did not actually yield a lot of bone the pathologist did not identify any osteomyelitis. 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 Is promised today and aggressive debridement of the right foot and a total contact cast which surprisingly she seemed to tolerate quite well 03/13/2019 upon evaluation today patient appears to be doing well with her total contact cast she is here for the first cast change. She had no areas of rubbing or any other complications at this point. Overall things are doing quite well. 2/26; the patient was kindly seen by Dr. Orvan Falconer of infectious disease on 2/25. She is now on IV vancomycin PICC line placement in 2 days and oral Flagyl. This was in response to her MRI showing osteomyelitis of the left calcaneus concerning for osteomyelitis. We have been putting her right leg in a total contact cast. Our nurses report drainage. She is tolerating the total contact cast well. 3/4; the patient is started her IV antibiotics and is taking IV vancomycin and oral Flagyl. She appears to be tolerating these both well. This is for osteomyelitis involving the left calcaneus. The area on the right in the setting of bilateral Charcot deformities did not have any bone infection on MRI. We put a total  contact cast on her with silver alginate as the primary dressing and she returns today in follow-up. Per our intake nurse there was too much drainage to leave this on all week therefore we will bring her back for an early change 3/8; follow-up total contact cast she is still having a lot of drainage probably too much drainage from the right foot wound will week with the same cast. She will be back on Thursday. The area on the left looks somewhat better 3/11; patient here for a total contact cast change. 3/15; patient came  in for wound care evaluation. She is still on IV vancomycin and oral Flagyl for osteomyelitis in the left foot. The area on the right foot we are putting in a total contact cast. 3/18 for total contact cast change on the right foot 3/22; the patient was here for wound review today. The area on the right foot is slightly smaller in terms of surface area. However the surface of the wound is very gritty and hyper granulated. More problematically the area on the plantar left foot has increased depth indeed in the center of this it probes to bone. We have been using Hydrofera Blue in both areas. She is on antibiotics as prescribed by infectious disease IV vancomycin and oral Flagyl 3/25; the patient is in for total contact cast change. Our intake nurse was concerned about the amount of drainage which soaked right to the multiple layers we are putting on this. Wondered about options. The wound bed actually looks as healthy as this is looked although there may not be a lot of change in dimensions things are looking a lot better. If we are going to heal this woman's wound which is in the middle of her right Charcot foot this is going to need to be offloaded. There are not many alternatives 3/29; still not a lot of improvement although the surface of the right wound looks better. We have been using Hydrofera Blue 04/17/2019 upon evaluation today patient appears to be doing well with a total contact cast. With actually having to change this twice a week. She saw [name] for wound care earlier in the week this is for the cast change today. That is due to the fact that she has significant drainage. Overall the wound is been looking excellent however. 4/5; we continue to put a total contact cast on this patient with Hydrofera Blue. Still a lot of drainage which precludes a simple weekly change or changing this twice a week. 4/8; total contact cast changed today. Apparently the wound looks better per our intake  nurse 4/12; patient here for wound care evaluation. Wounds on the right foot have not changed in dimension none about a month left foot looks similar. Apparently her antibiotics that we are giving her for osteomyelitis in the left foot complete on Wednesday. We have been using Hydrofera Blue on the right foot under a total contact cast. Nurses report greenish drainage although I think this may be discoloration from the [facility] 4/15; back for cast change wounds look quite good although still moderate amount of drainage. T contact cast reapplied otal 4/19;. Wound evaluation. Wound on the right foot is smaller surface looks healthy. There is still the divot in the middle part of this wound but I could not feel any bone. Area on the left foot also looks better She has completed her antibiotics but still has the PICC line in. 4/23; patient comes back for a total contact cast  change this was done in the standard fashion no issues 4/26; wound measures slightly larger on the right foot which is disappointing. She sees Dr. Orvan Falconer with regards to her antibiotics on Wednesday. I believe she is on vancomycin IV and oral Flagyl. 4/29; in for her routine total contact cast change. She saw Dr. Orvan Falconer this morning. He is asking about antibiotic continuation I will be in touch with him. I did not look at her wound today 5/3; patient saw Dr. Orvan Falconer on 4/28. I did send him a note asking about the continuation of perhaps an oral regimen. I have not heard back. She still has an elevated sedimentation rate at 81. Her wounds are smaller. 5/6; in for her total contact cast change. We did not look at the wound today. She has seen Dr. Orvan Falconer and is now on oral Keflex and Flagyl. She seems to be tolerating these well 5/10; she had her PICC line removed. The wound on the right foot after some initial improvement has not made any improvement even with the total contact cast. Still having a lot of drainage.  Likewise the area on the left foot really is not any better either 5/14; in for a cast change today. We will look at the right foot on Monday. If this is not making any progress I am going to try to cast the left foot. 5/17; the wound on the right foot is absolutely no different. I am going to put ointment on this area and change the cast of the left foot. We have been using silver alginate there 5/21; apparently the area on the right foot was better per our intake nurse although I did not see it. We did a standard total contact cast change on the left foot we have been applying silver alginate to the wound here. 06/10/19-Area on the right foot per dimensions slightly better, the left foot has still some depth applying silver alginate to the wound with TCC 06/13/19-Patient here for Casting to Left foot 6/1; patient had the total contact cast were placed on the left foot. Noted some swelling in her right leg although the dimensions of the wound on the right foot are better. We have been using PolyMem on the right and silver alginate on the left under the cast 6/7; we continue to make nice progress in both her plantar feet wound. We are putting the total contact cast on the left. Surprisingly in spite of this the area on the right medial foot in the middle of her Charcot deformities actually is making progress as well 07/01/2019 upon evaluation today patient actually appears to be doing quite well with regard to her wounds on the feet compared to when I saw her last that has been quite some time. With that being said I do believe the total contact cast has been beneficial in this left foot and she has a lot of signs of improvement she does have some callus overhanging which is caused a little bit of a ledge and an issue here. I do believe that we may be able to do something to help in that regard. Fortunately otherwise the wound seems to be progressing quite nicely. 6/21; the patient's area on the left  foot that we are currently casting is just about closed. The area on the right medial foot has a healthy looking surface but may be not changed as much from last week in terms of surface area. We are using silver alginate on the right and  polymen Ag under the cast 6/28; only a small open area on the left foot remains. Using silver alginate under a total contact cast. She has a diabetic shoe I have asked her to bring that for next week where this may be closed. The area on the right foot with a Charcot deformity is also measuring slightly smaller. We are using PolyMem Ag here 7/6; left foot is totally closed. She does not require a total contact cast. She will graduate to her own diabetic shoe. ooThe area on the right foot with a Charcot deformity measuring about the same. We have been using polymen under compression wraps. She did not do well in this area with a total contact cast 7/13; left foot remains intact. She is skillfully covering this area with foam and using her diabetic shoe. Slight area improvement of the right foot wound. Still rolled senescent looking edges. We have been using polymen 7/19; left foot remains intact. Wound measuring slightly smaller on the right foot in the middle of the Charcot deformity. We have been using PolyMem Ag 7/26; per the patient left foot remains intact. The wound on the right foot looks slightly smaller but dimensions are measuring the same. We have been using polymen Ag 8/9-Patient back after 2 weeks, wound of the right foot again looking slightly smaller, continuing to use PolyMem silver 8/16; patient has made some minor improvements in wound area on the right foot. Using polymen Ag. The left foot remains healed 8/23; continued slight improvement in dimensions. Rolled edges around the wound edge noted. We have been using polymen Ag. Patient offloading with a surgical shoe and crutches 8/30; no change in surface area. Thick rolled skin and subcutaneous  tissue around the edges debris on the wound surface. We have been using polymen Ag initially with some improvement however stalled on the last 2 weeks in terms of surface area Objective Constitutional Sitting or standing Blood Pressure is within target range for patient.. Pulse regular and within target range for patient.Marland Kitchen Respirations regular, non-labored and within target range.. Temperature is normal and within the target range for the patient.Marland Kitchen Appears in no distress. Vitals Time Taken: 1:50 PM, Height: 68 in, Weight: 265 lbs, BMI: 40.3, Temperature: 97.5 F, Pulse: 77 bpm, Respiratory Rate: 18 breaths/min, Blood Pressure: 104/68 mmHg. General Notes: Wound exam; right foot. Raised edges around the wound skin and subcutaneous tissue debris on the wound surface. An aggressive debridement with a #5 curette. I brought the circumference of the wound down to the level of the wound itself and debrided the wound surface. Things look quite good post debridement. No evidence of surrounding infection Integumentary (Hair, Skin) Wound #9 status is Open. Original cause of wound was Blister. The wound is located on the Right,Medial Foot. The wound measures 1.5cm length x 1.5cm width x 0.6cm depth; 1.767cm^2 area and 1.06cm^3 volume. There is Fat Layer (Subcutaneous Tissue) exposed. There is no tunneling noted, however, there is undermining starting at 1:00 and ending at 6:00 with a maximum distance of 0.6cm. There is a medium amount of serosanguineous drainage noted. The wound margin is thickened. There is large (67-100%) red granulation within the wound bed. There is no necrotic tissue within the wound bed. Assessment Active Problems ICD-10 Type 2 diabetes mellitus with foot ulcer Non-pressure chronic ulcer of other part of right foot limited to breakdown of skin Type 2 diabetes mellitus with diabetic polyneuropathy Charcot's joint, right ankle and foot Charcot's joint, left ankle and foot Other  chronic osteomyelitis,  left ankle and foot Procedures Wound #9 Pre-procedure diagnosis of Wound #9 is a Diabetic Wound/Ulcer of the Lower Extremity located on the Right,Medial Foot .Severity of Tissue Pre Debridement is: Fat layer exposed. There was a Excisional Skin/Subcutaneous Tissue Debridement with a total area of 2.25 sq cm performed by [name], MD. With the following instrument(s): Curette to remove Viable and Non-Viable tissue/material. Material removed includes Callus and Subcutaneous Tissue and. No specimens were taken. A time out was conducted at 14:38, prior to the start of the procedure. A Moderate amount of bleeding was controlled with Pressure. The procedure was tolerated well with a pain level of 0 throughout and a pain level of 0 following the procedure. Post Debridement Measurements: 1.5cm length x 1.5cm width x 0.6cm depth; 1.06cm^3 volume. Character of Wound/Ulcer Post Debridement is improved. Severity of Tissue Post Debridement is: Fat layer exposed. Post procedure Diagnosis Wound #9: Same as Pre-Procedure Pre-procedure diagnosis of Wound #9 is a Diabetic Wound/Ulcer of the Lower Extremity located on the Right,Medial Foot . There was a Three Layer Compression Therapy Procedure by Zandra Abts, RN. Post procedure Diagnosis Wound #9: Same as Pre-Procedure Plan Follow-up Appointments: Return Appointment in 2 weeks. Nurse Visit: - 1 week for rewrap Dressing Change Frequency: Wound #9 Right,Medial Foot: Do not change entire dressing for one week. Wound Cleansing: Wound #9 Right,Medial Foot: May shower with protection. - use cast protector Primary Wound Dressing: Wound #9 Right,Medial Foot: Polymem Silver Secondary Dressing: Wound #9 Right,Medial Foot: Foam - foam donut ABD pad Zetuvit or Kerramax Edema Control: 3 Layer Compression System - Right Lower Extremity Avoid standing for long periods of time Elevate legs to the level of the heart or above for  30 minutes daily and/or when sitting, a frequency of: - throughout the day Exercise regularly Off-Loading: Open toe surgical shoe to: - right foot 1. Right medial foot. I have continued with the polymen Ag post aggressive debridement today. We were making good progress with this for a while. Careful attention to the condition of the wound circumference and wound surface next week. May need to go back to [facility] under a total contact cast 2. The debridement of thick skin over subcutaneous tissue from the wound circumference suggest to me inadequate pressure relief. Further debridement may be necessary. Electronic Signature(s) Signed: 09/16/2019 4:22:50 PM By: Baltazar Najjar MD Entered By: Baltazar Najjar on 09/16/2019 15:22:35 -------------------------------------------------------------------------------- SuperBill Details Patient Name: Date of Service: [name], [name], [name] 09/16/2019 Medical Record Number: [id number] Patient Account Number: [id number] Date of Birth/Sex: Treating RN: October 22, 1955 (64 y.o. [name] Primary Care Provider: MA SO UDI, [place] MA LSA DA T Other Clinician: Referring Provider: Treating Provider/Extender: Abe People, ELHA MA LSA DA T Weeks in Treatment: 33 Diagnosis Coding ICD-10 Codes Code Description E11.621 Type 2 diabetes mellitus with foot ulcer L97.511 Non-pressure chronic ulcer of other part of right foot limited to breakdown of skin E11.42 Type 2 diabetes mellitus with diabetic polyneuropathy M14.671 Charcot's joint, right ankle and foot M14.672 Charcot's joint, left ankle and foot 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, left ankle and foot Facility Procedures CPT4 Code: [id number] Description: 11042 - DEB SUBQ TISSUE 20 SQ CM/< ICD-10 Diagnosis Description L97.511 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

## 2019-09-16 NOTE — Progress Notes (Signed)
[name], [name] ([id number]) Visit Report for 09/16/2019 Arrival Information Details Patient Name: Date of Service: [name], [name] 09/16/2019 1:45 PM Medical Record Number: [id number] Patient Account Number: [id number] Date of Birth/Sex: Treating RN: 1956-01-06 (64 y.o. F) Dwiggins, Shannon Primary Care Jarius Dieudonne: MA SO UDI, [place] MA LSA DA T Other Clinician: Referring Tarus Briski: Treating Dann Galicia/Extender: Baltazar Najjar MA SO UDI, ELHA MA LSA DA T Weeks in Treatment: 51 Visit Information History Since Last Visit Added or deleted any medications: No Patient Arrived: Crutches Any new allergies or adverse reactions: No Arrival Time: 13:49 Had a fall or experienced change in No Accompanied By: self activities of daily living that may affect Transfer Assistance: None risk of falls: Patient Identification Verified: Yes Signs or symptoms of abuse/neglect since last visito No Secondary Verification Process Completed: Yes Hospitalized since last visit: No Patient Requires Transmission-Based Precautions: No Implantable device outside of the clinic excluding No Patient Has Alerts: No cellular tissue based products placed in the [name] since last visit: Has Dressing in Place as Prescribed: Yes Has Compression in Place as Prescribed: Yes Pain Present Now: No Electronic Signature(s) Signed: 09/16/2019 4:26:23 PM By: [name] Entered By: [name] on 09/16/2019 13:50:23 -------------------------------------------------------------------------------- Compression Therapy Details Patient Name: Date of Service: [name], [name] 09/16/2019 1:45 PM Medical Record Number: [id number] Patient Account Number: [id number] Date of Birth/Sex: Treating RN: 1955-06-05 (64 y.o. [name] Primary Care Audrie Kuri: MA SO UDI, Judene Companion MA LSA DA T Other Clinician: Referring Aunya Lemler: Treating [place] Skalicky/Extender: Baltazar Najjar MA SO UDI, ELHA MA LSA DA T Weeks in Treatment:  60 Compression Therapy Performed for Wound Assessment: Wound #9 Right,Medial Foot Performed By: Clinician Zandra Abts, RN Compression Type: Three Layer Post Procedure Diagnosis Same as Pre-procedure Electronic Signature(s) Signed: 09/16/2019 4:24:12 PM By: Zandra Abts RN, BSN Entered By: Zandra Abts on 09/16/2019 14:43:35 -------------------------------------------------------------------------------- Encounter Discharge Information Details Patient Name: Date of Service: [name], [name] 09/16/2019 1:45 PM Medical Record Number: [id number] Patient Account Number: [id number] Date of Birth/Sex: Treating RN: [birth year] (64 y.o. F) [name] Primary Care Latayna Ritchie: MA SO UDI, [place] MA LSA DA T Other Clinician: Referring Aryahi Denzler: Treating Kawan Valladolid/Extender: Baltazar Najjar MA SO UDI, ELHA MA LSA DA T Weeks in Treatment: 3 Encounter Discharge Information Items Post Procedure Vitals Discharge Condition: Stable Temperature (F): 97.5 Ambulatory Status: Crutches Pulse (bpm): 77 Discharge Destination: Home Respiratory Rate (breaths/min): 18 Transportation: Private Auto Blood Pressure (mmHg): 104/68 Accompanied By: self Schedule Follow-up Appointment: Yes Clinical Summary of Care: Patient Declined Electronic Signature(s) Signed: 09/16/2019 4:26:23 PM By: [name] Entered By: [name] on 09/16/2019 15:48:30 -------------------------------------------------------------------------------- Lower Extremity Assessment Details Patient Name: Date of Service: [facility] 09/16/2019 1:45 PM Medical Record Number: [id number] Patient Account Number: [id number] Date of Birth/Sex: Treating RN: [birth year] (64 y.o. F) [name] Primary Care Ricco Dershem: MA SO UDI, [place] MA LSA DA T Other Clinician: Referring Kathelyn Gombos: Treating Tonica Brasington/Extender: Baltazar Najjar MA SO UDI, ELHA MA LSA DA T Weeks in Treatment: 33 Edema Assessment Assessed: [Left:  No] [Right: No] Edema: [Left: Ye] [Right: s] Calf Left: Right: Point of Measurement: 43 cm From Medial Instep cm 34 cm Ankle Left: Right: Point of Measurement: 12 cm From Medial Instep cm 22 cm Vascular Assessment Pulses: Dorsalis Pedis Palpable: [Right:Yes] Electronic Signature(s) Signed: 09/16/2019 4:26:23 PM By: [name] Entered By: [name] on 09/16/2019 13:53:04 -------------------------------------------------------------------------------- Multi Wound Chart Details Patient Name: Date of Service: [name], [name] 09/16/2019 1:45 PM Medical Record Number: [id number] Patient  Account Number: [id number] Date of Birth/Sex: Treating RN: 11-15-1955 (64 y.o. [name] Primary Care Rayan Dyal: MA SO UDI, [place] MA LSA DA T Other Clinician: Referring Crystallynn Noorani: Treating Lynell Kussman/Extender: Baltazar Najjar MA SO UDI, ELHA MA LSA DA T Weeks in Treatment: 30 Vital Signs Height(in): 68 Pulse(bpm): 77 Weight(lbs): 265 Blood Pressure(mmHg): 104/68 Body Mass Index(BMI): 40 Temperature(F): 97.5 Respiratory Rate(breaths/min): 18 Photos: [9:No Photos Right, Medial Foot] [N/A:N/A N/A] Wound Location: [9:Blister] [N/A:N/A] Wounding Event: [9:Diabetic Wound/Ulcer of the Lower] [N/A:N/A] Primary Etiology: [9:Extremity Glaucoma, Chronic sinus] [N/A:N/A] Comorbid History: [9:problems/congestion, Anemia, Sleep Apnea, Hypertension, Type II Diabetes, Rheumatoid Arthritis, Neuropathy 06/18/2018] [N/A:N/A] Date Acquired: [9:33] [N/A:N/A] Weeks of Treatment: [9:Open] [N/A:N/A] Wound Status: [9:1.5x1.5x0.6] [N/A:N/A] Measurements L x W x D (cm) [9:1.767] [N/A:N/A] A (cm) : rea [9:1.06] [N/A:N/A] Volume (cm) : [9:83.90%] [N/A:N/A] % Reduction in A [9:rea: 89.30%] [N/A:N/A] % Reduction in Volume: [9:1] Starting Position 1 (o'clock): [9:6] Ending Position 1 (o'clock): [9:0.6] Maximum Distance 1 (cm): [9:Yes] [N/A:N/A] Undermining: [9:Grade 2] [N/A:N/A] Classification:  [9:Medium] [N/A:N/A] Exudate A mount: [9:Serosanguineous] [N/A:N/A] Exudate Type: [9:red, brown] [N/A:N/A] Exudate Color: [9:Thickened] [N/A:N/A] Wound Margin: [9:Large (67-100%)] [N/A:N/A] Granulation A mount: [9:Red] [N/A:N/A] Granulation Quality: [9:None Present (0%)] [N/A:N/A] Necrotic A mount: [9:Fat Layer (Subcutaneous Tissue): Yes N/A] Exposed Structures: [9:Fascia: No Tendon: No Muscle: No Joint: No Bone: No Small (1-33%)] [N/A:N/A] Epithelialization: [9:Debridement - Excisional] [N/A:N/A] Debridement: Pre-procedure Verification/Time Out 14:38 [N/A:N/A] Taken: [9:Callus, Subcutaneous] [N/A:N/A] Tissue Debrided: [9:Skin/Subcutaneous Tissue] [N/A:N/A] Level: [9:2.25] [N/A:N/A] Debridement A (sq cm): [9:rea Curette] [N/A:N/A] Instrument: [9:Moderate] [N/A:N/A] Bleeding: [9:Pressure] [N/A:N/A] Hemostasis A chieved: [9:0] [N/A:N/A] Procedural Pain: [9:0] [N/A:N/A] Post Procedural Pain: [9:Procedure was tolerated well] [N/A:N/A] Debridement Treatment Response: [9:1.5x1.5x0.6] [N/A:N/A] Post Debridement Measurements L x W x D (cm) [9:1.06] [N/A:N/A] Post Debridement Volume: (cm) [9:Compression Therapy] [N/A:N/A] Procedures Performed: [9:Debridement] Treatment Notes Electronic Signature(s) Signed: 09/16/2019 4:22:50 PM By: Baltazar Najjar MD Signed: 09/16/2019 4:24:12 PM By: Zandra Abts RN, BSN Entered By: Baltazar Najjar on 09/16/2019 15:13:53 -------------------------------------------------------------------------------- Multi-Disciplinary Care Plan Details Patient Name: Date of Service: [name], [name] 09/16/2019 1:45 PM Medical Record Number: [id number] Patient Account Number: [id number] Date of Birth/Sex: Treating RN: 03/18/1955 (64 y.o. [name] Primary Care Cedra Villalon: MA SO UDI, Judene Companion MA LSA DA T Other Clinician: Referring Kila Godina: Treating Neville Walston/Extender: Baltazar Najjar MA SO UDI, ELHA MA LSA DA T Weeks in Treatment: 56 Active  Inactive Wound/Skin Impairment Nursing Diagnoses: Impaired tissue integrity Knowledge deficit related to ulceration/compromised skin integrity Goals: Patient/caregiver will verbalize understanding of skin care regimen Date Initiated: 01/28/2019 Target Resolution Date: 10/04/2019 Goal Status: Active Interventions: Assess patient/caregiver ability to obtain necessary supplies Assess patient/caregiver ability to perform ulcer/skin care regimen upon admission and as needed Assess ulceration(s) every visit Provide education on ulcer and skin care Notes: Electronic Signature(s) Signed: 09/16/2019 4:24:12 PM By: Zandra Abts RN, BSN Entered By: Zandra Abts on 09/16/2019 15:28:46 -------------------------------------------------------------------------------- Pain Assessment Details Patient Name: Date of Service: [name], [name] 09/16/2019 1:45 PM Medical Record Number: [id number] Patient Account Number: [id number] Date of Birth/Sex: Treating RN: 04-28-1955 (64 y.o. [name] Primary Care Vicky Mccanless: MA SO UDI, [place] MA LSA DA T Other Clinician: Referring Jacinda Kanady: Treating Carson Bogden/Extender: Baltazar Najjar MA SO UDI, ELHA MA LSA DA T Weeks in Treatment: 28 Active Problems Location of Pain Severity and Description of Pain Patient Has Paino No Site Locations Pain Management and Medication Current Pain Management: Electronic Signature(s) Signed: 09/16/2019 4:26:23 PM By: [name] Entered By: [name] on 09/16/2019 13:50:50 -------------------------------------------------------------------------------- Patient/Caregiver Education Details  Patient Name: Date of Service: [name], [name] 8/30/2021andnbsp1:45 PM Medical Record Number: [id number] Patient Account Number: [id number] Date of Birth/Gender: Treating RN: [birth year] (64 y.o. [name] Primary Care Physician: MA SO UDI, Judene Companion MA LSA DA T Other Clinician: Referring  Physician: Treating Physician/Extender: Baltazar Najjar MA SO UDI, ELHA MA LSA DA T Weeks in Treatment: 59 Education Assessment Education Provided To: Patient Education Topics Provided Wound/Skin Impairment: Methods: Explain/Verbal Responses: State content correctly [employer]) Signed: 09/16/2019 4:24:12 PM By: Zandra Abts RN, BSN Entered By: Zandra Abts on 09/16/2019 15:28:58 -------------------------------------------------------------------------------- Wound Assessment Details Patient Name: Date of Service: [name], [name] 09/16/2019 1:45 PM Medical Record Number: [id number] Patient Account Number: [id number] Date of Birth/Sex: Treating RN: [birth year] (64 y.o. F) Dwiggins, Shannon Primary Care Deaven Barron: MA SO UDI, [place] MA LSA DA T Other Clinician: Referring Cinque Begley: Treating Kavan Devan/Extender: Baltazar Najjar MA SO UDI, ELHA MA LSA DA T Weeks in Treatment: 33 Wound Status Wound Number: 9 Primary Diabetic Wound/Ulcer of the Lower Extremity Etiology: Wound Location: Right, Medial Foot Wound Open Wounding Event: Blister Status: Date Acquired: 06/18/2018 Comorbid Glaucoma, Chronic sinus problems/congestion, Anemia, Sleep Weeks Of Treatment: 33 History: Apnea, Hypertension, Type II Diabetes, Rheumatoid Arthritis, Clustered Wound: No Neuropathy Wound Measurements Length: (cm) 1.5 Width: (cm) 1.5 Depth: (cm) 0.6 Area: (cm) 1.767 Volume: (cm) 1.06 % Reduction in Area: 83.9% % Reduction in Volume: 89.3% Epithelialization: Small (1-33%) Tunneling: No Undermining: Yes Starting Position (o'clock): 1 Ending Position (o'clock): 6 Maximum Distance: (cm) 0.6 Wound Description Classification: Grade 2 Wound Margin: Thickened Exudate Amount: Medium Exudate Type: Serosanguineous Exudate Color: red, brown Foul Odor After Cleansing: No Slough/Fibrino No Wound Bed Granulation Amount: Large (67-100%) Exposed Structure Granulation Quality: Red Fascia  Exposed: No Necrotic Amount: None Present (0%) Fat Layer (Subcutaneous Tissue) Exposed: Yes Tendon Exposed: No Muscle Exposed: No Joint Exposed: No Bone Exposed: No Treatment Notes Wound #9 (Right, Medial Foot) 1. Cleanse With Wound Cleanser Soap and water 2. Periwound Care Moisturizing lotion 3. Primary Dressing Applied Polymem Ag 4. Secondary Dressing ABD Pad Dry Gauze Kerramax/Xtrasorb 6. Support Layer Applied 3 layer compression wrap Notes zetuvit. Scientist, research (physical sciences)) Signed: 09/16/2019 4:26:23 PM By: [name] Entered By: [name] on 09/16/2019 14:01:48 -------------------------------------------------------------------------------- Vitals Details Patient Name: Date of Service: [name], Samyria J. 09/16/2019 1:45 PM Medical Record Number: [id number] Patient Account Number: [id number] Date of Birth/Sex: Treating RN: [birth year] (64 y.o. F) Dwiggins, Shannon Primary Care Pellegrino Kennard: MA SO UDI, [place] MA LSA DA T Other Clinician: Referring Caera Enwright: Treating Sahiba Granholm/Extender: Baltazar Najjar MA SO UDI, ELHA MA LSA DA T Weeks in Treatment: 62 Vital Signs Time Taken: 13:50 Temperature (F): 97.5 Height (in): 68 Pulse (bpm): 77 Weight (lbs): 265 Respiratory Rate (breaths/min): 18 Body Mass Index (BMI): 40.3 Blood Pressure (mmHg): 104/68 Reference Range: 80 - 120 mg / dl Electronic Signature(s) Signed: 09/16/2019 4:26:23 PM By: [name] Entered By: [name] on 09/16/2019 13:50:44

## 2019-09-16 NOTE — Patient Instructions (Signed)
Thank you for allowing [place] to provide your care today. Coagulation for weight loss, and following a healthier diet.  I am also glad that you received your COVID shot. Your blood pressure is a slightly high.  But as we discussed and per your preference, we will not add to your medications today and we will monitor at next visit.  Please take your medications as before.  I will check your hemoglobin A1c today and will let you know if you need to change your diabetes medication.  Please make sure to follow-up with wound care today.  Please come back to clinic in 3 months for blood work and follow-up on diabetes or earlier as needed.  As always, if having severe symptoms, please seek medical attention at emergency room. Should you have any questions or concerns please call the internal medicine clinic at [phone].    Thank you!

## 2019-09-16 NOTE — Assessment & Plan Note (Addendum)
Patient lost 9 lb in past 5 weeks, and by exercise and NASH diet. Her wight today is 243 lb. I congratulated her.

## 2019-09-16 NOTE — Assessment & Plan Note (Addendum)
Reports compliance to Janumet 50-500 mg BID. DM well controlled. Last Hb A1c on May 2021 was 6. She continuous to have NASH diet and abetter life style. I congratulated her for loosing some weight by changing her diet and daily exercise.  -Continue Janumet 50-500 mg BID -HbA1c>>Will check it next visit per patient's preference  -f/u in clinic in 3 months

## 2019-09-16 NOTE — Assessment & Plan Note (Addendum)
BP today 135/65. Mildly above the goal. Patient is on Lisinopril 10 mg QD. She does not check her BP at home. (Does not have a BP machine) but mentions that her BP at weekly visit at wound care clinic is always lower and closer to normal. her last Bps on prior visit were:  BP Readings from Last 3 Encounters:  09/16/19 135/65  08/20/19 132/74  07/24/19 117/73   -We will continue current medication and will recheck his BP next visit. -Continue life style modification with NASH diet and exercise -Will check BMP next visit

## 2019-09-16 NOTE — Assessment & Plan Note (Deleted)
This has been a chronic issue. She regularly follows at wound care clinic (once a week).  She reports occasional pain of rt foot for past 1-2 weeks. No fever or chills. She is able to walk on her leges using cane. She has an appointment with wound care clinic today. Appreciate recommendations.

## 2019-09-16 NOTE — Progress Notes (Signed)
. ° ° °  CC: F/u of HTN and chronic wound  HPI:  [name] is a 64 y.o. female with PMHx as documented below, presented for follow-up.  We addressed his diabetes, hypertension, chronic wound today. Please refer to problem based charting for further details and assessment and plan of current problem and chronic medical conditions.  Past medical history: HTN, OSA, GERD, type II DM, Charcot's foot, CKD 3, HLD, normocytic anemia, obesity  Medications: ASA, Lipitor, ferrous sulfate, Janumet 50-500 mg twice daily, lisinopril 10 mg daily, pantoprazole 20 mg twice daily, vitamin B12, vitamin C, zinc  Past Medical History:  Diagnosis Date   Chronic normocytic anemia    CKD (chronic kidney disease) stage 3, GFR 30-59 ml/min 02/02/2013   Diabetes mellitus type II, controlled (HCC) 06/17/2009   Essential hypertension 06/17/2009   GERD (gastroesophageal reflux disease)    Glaucoma    H/O hiatal hernia    Hyperlipidemia    MRSA (methicillin resistant Staphylococcus aureus)    Obstructive sleep apnea 05/26/2010   Review of Systems:   Constitutional: Negative for chills and fever.  Respiratory: Negative for shortness of breath.   Cardiovascular: Negative for chest pain and leg swelling.  Gastrointestinal: Negative for abdominal pain, nausea and vomiting.  MSK: Positive for right foot pain Neurological: Negative for dizziness and headaches.   Physical Exam:  Vitals:   09/16/19 1009  BP: 135/65  Pulse: 68  Temp: 98.4 F (36.9 C)  TempSrc: Oral  SpO2: 100%  Weight: 243 lb 11.2 oz (110.5 kg)  Height: 5\' 9"  (1.753 m)   Constitutional: Pleasant lady, well-developed and well-nourished. No acute distress.  Cardiovascular: RRR, nl S1S2, no murmur,  no LEE Respiratory: Effort normal and breath sounds normal. No respiratory distress. No wheezes.  GI: Soft. No distension.  Neurological: Is alert and oriented x 3  Skin: Not diaphoretic. No erythema.  MSK: Lower extremities are  wrapped under stocking.  Has chronic right foot deformity. Psychiatric: Normal mood and affect. Behavior is normal. Judgment and thought content normal.   Assessment & Plan:   See Encounters Tab for problem based charting.  Patient discussed with Dr. 

## 2019-09-18 NOTE — Progress Notes (Signed)
Internal Medicine Clinic Attending  Case discussed with Dr. Masoudi  At the time of the visit.  We reviewed the resident's history and exam and pertinent patient test results.  I agree with the assessment, diagnosis, and plan of care documented in the resident's note.  

## 2019-09-22 ENCOUNTER — Other Ambulatory Visit: Payer: Self-pay | Admitting: Internal Medicine

## 2019-09-22 DIAGNOSIS — E1169 Type 2 diabetes mellitus with other specified complication: Secondary | ICD-10-CM

## 2019-09-24 ENCOUNTER — Encounter (HOSPITAL_BASED_OUTPATIENT_CLINIC_OR_DEPARTMENT_OTHER): Payer: Medicare Other | Attending: Internal Medicine | Admitting: Internal Medicine

## 2019-09-24 ENCOUNTER — Other Ambulatory Visit: Payer: Self-pay

## 2019-09-24 DIAGNOSIS — Z8614 Personal history of Methicillin resistant Staphylococcus aureus infection: Secondary | ICD-10-CM | POA: Insufficient documentation

## 2019-09-24 DIAGNOSIS — E1169 Type 2 diabetes mellitus with other specified complication: Secondary | ICD-10-CM | POA: Insufficient documentation

## 2019-09-24 DIAGNOSIS — M069 Rheumatoid arthritis, unspecified: Secondary | ICD-10-CM | POA: Diagnosis not present

## 2019-09-24 DIAGNOSIS — E1142 Type 2 diabetes mellitus with diabetic polyneuropathy: Secondary | ICD-10-CM | POA: Insufficient documentation

## 2019-09-24 DIAGNOSIS — G4733 Obstructive sleep apnea (adult) (pediatric): Secondary | ICD-10-CM | POA: Insufficient documentation

## 2019-09-24 DIAGNOSIS — E1122 Type 2 diabetes mellitus with diabetic chronic kidney disease: Secondary | ICD-10-CM | POA: Insufficient documentation

## 2019-09-24 DIAGNOSIS — E11610 Type 2 diabetes mellitus with diabetic neuropathic arthropathy: Secondary | ICD-10-CM | POA: Insufficient documentation

## 2019-09-24 DIAGNOSIS — M86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, left ankle and foot: Secondary | ICD-10-CM | POA: Diagnosis not present

## 2019-09-24 DIAGNOSIS — K219 Gastro-esophageal reflux disease without esophagitis: Secondary | ICD-10-CM | POA: Diagnosis not present

## 2019-09-24 DIAGNOSIS — I129 Hypertensive chronic kidney disease with stage 1 through stage 4 chronic kidney disease, or unspecified chronic kidney disease: Secondary | ICD-10-CM | POA: Insufficient documentation

## 2019-09-24 DIAGNOSIS — N183 Chronic kidney disease, stage 3 unspecified: Secondary | ICD-10-CM | POA: Insufficient documentation

## 2019-09-24 DIAGNOSIS — L97511 Non-pressure chronic ulcer of other part of right foot limited to breakdown of skin: Secondary | ICD-10-CM | POA: Diagnosis not present

## 2019-09-24 DIAGNOSIS — E11621 Type 2 diabetes mellitus with foot ulcer: Secondary | ICD-10-CM | POA: Insufficient documentation

## 2019-09-24 NOTE — Progress Notes (Signed)
ENDORA, TERESI ([id number]) Visit Report for 09/24/2019 SuperBill Details Patient Name: Date of Service: [name], [name] 09/24/2019 Medical Record Number: [id number] Patient Account Number: [id number] Date of Birth/Sex: Treating RN: [birth year] (64 y.o. 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

## 2019-09-25 NOTE — Progress Notes (Signed)
Sarah, [name] ([id number]) Visit Report for 09/24/2019 Arrival Information Details Patient Name: Date of Service: [name], [name] 09/24/2019 12:45 PM Medical Record Number: [id number] Patient Account Number: [id number] Date of Birth/Sex: Treating RN: 1955/05/15 (64 y.o. F) Epps, Carrie Primary Care Fitzroy Mikami: MA SO UDI, [place] MA LSA DA T Other Clinician: Referring Allyne Hebert: Treating Inaya Gillham/Extender: Baltazar Najjar MA SO UDI, ELHA MA LSA DA T Weeks in Treatment: 15 Visit Information History Since Last Visit Added or deleted any medications: No Patient Arrived: Crutches Any new allergies or adverse reactions: No Arrival Time: 13:01 Had a fall or experienced change in No Accompanied By: self activities of daily living that may affect Transfer Assistance: None risk of falls: Patient Identification Verified: Yes Signs or symptoms of abuse/neglect since last visito No Secondary Verification Process Completed: Yes Hospitalized since last visit: No Patient Requires Transmission-Based Precautions: No Implantable device outside of the clinic excluding No Patient Has Alerts: No cellular tissue based products placed in the center since last visit: Has Dressing in Place as Prescribed: Yes Pain Present Now: No Electronic Signature(s) Signed: 09/25/2019 1:03:45 PM By: Karl Ito Entered By: Karl Ito on 09/24/2019 13:02:02 -------------------------------------------------------------------------------- Compression Therapy Details Patient Name: Date of Service: Sarah Boss, [name] 09/24/2019 12:45 PM Medical Record Number: [id number] Patient Account Number: [id number] Date of Birth/Sex: Treating RN: [birth year] (64 y.o. [name] Primary Care Babe Anthis: MA SO UDI, [place] MA LSA DA T Other Clinician: Referring Hadassah Rana: Treating Tanajah Boulter/Extender: Baltazar Najjar MA SO UDI, ELHA MA LSA DA T Weeks in Treatment: 34 Compression Therapy Performed for Wound Assessment:  Wound #9 Right,Medial Foot Performed By: Clinician [name], RN Compression Type: Three Layer Electronic Signature(s) Signed: 09/24/2019 5:24:19 PM By: [name] Entered By: [name] on 09/24/2019 13:34:33 -------------------------------------------------------------------------------- Encounter Discharge Information Details Patient Name: Date of Service: [facility], [name] 09/24/2019 12:45 PM Medical Record Number: [id number] Patient Account Number: [id number] Date of Birth/Sex: Treating RN: [birth year] (64 y.o. [name] Primary Care Barbaraann Avans: Other Clinician: MA SO UDI, [place] Myrtle LSA DA T Referring Dore Oquin: Treating Doretha Goding/Extender: Baltazar Najjar MA SO UDI, ELHA MA LSA DA T Weeks in Treatment: 58 Encounter Discharge Information Items Discharge Condition: Stable Ambulatory Status: Crutches Discharge Destination: Home Transportation: Private Auto Accompanied By: self Schedule Follow-up Appointment: Yes Clinical Summary of Care: Patient Declined Electronic Signature(s) Signed: 09/24/2019 5:24:19 PM By: [name] Entered By: [name] on 09/24/2019 13:35:44 -------------------------------------------------------------------------------- Patient/Caregiver Education Details Patient Name: Date of Service: [name] 9/7/2021andnbsp12:45 PM Medical Record Number: [id number] Patient Account Number: [id number] Date of Birth/Gender: Treating RN: [birth year] (64 y.o. [name] Primary Care Physician: MA SO UDI, Judene Companion MA LSA DA T Other Clinician: Referring Physician: Treating Physician/Extender: Baltazar Najjar MA SO UDI, ELHA MA LSA DA T Weeks in Treatment: 42 Education Assessment Education Provided To: Patient Education Topics Provided Wound/Skin Impairment: Handouts: Caring for Your Ulcer Methods: Explain/Verbal Responses: State content correctly Electronic Signature(s) Signed: 09/24/2019 5:24:19 PM By:  [name] Entered By: [name] on 09/24/2019 13:35:22 -------------------------------------------------------------------------------- Wound Assessment Details Patient Name: Date of Service: Sarah [name], Lupita J. 09/24/2019 12:45 PM Medical Record Number: [id number] Patient Account Number: [id number] Date of Birth/Sex: Treating RN: 09/11/1955 (64 y.o. [name] Primary Care Lylah Lantis: MA SO UDI, [place] MA LSA DA T Other Clinician: Referring Theus Espin: Treating Neftaly Swiss/Extender: Baltazar Najjar MA SO UDI, ELHA MA LSA DA T Weeks in Treatment: 34 Wound Status Wound Number: 9 Primary Etiology: Diabetic Wound/Ulcer of the Lower Extremity Wound Location:  Right, Medial Foot Wound Status: Open Wounding Event: Blister Date Acquired: 06/18/2018 Weeks Of Treatment: 34 Clustered Wound: No Wound Measurements Length: (cm) 1.5 Width: (cm) 1.5 Depth: (cm) 0.6 Area: (cm) 1.767 Volume: (cm) 1.06 % Reduction in Area: 83.9% % Reduction in Volume: 89.3% Wound Description Classification: Grade 2 Treatment Notes Wound #9 (Right, Medial Foot) 1. Cleanse With Wound Cleanser Soap and water 2. Periwound Care Moisturizing lotion 3. Primary Dressing Applied Polymem Ag 4. Secondary Dressing ABD Pad Dry Gauze Kerramax/Xtrasorb 6. Support Layer Applied 3 layer compression wrap Notes zetuvit. Scientist, research (physical sciences)) Signed: 09/25/2019 1:03:45 PM By: Karl Ito Signed: 09/25/2019 5:14:09 PM By: Yevonne Pax RN Entered By: Karl Ito on 09/24/2019 13:02:36 -------------------------------------------------------------------------------- Vitals Details Patient Name: Date of Service: Sarah CH, [name] 09/24/2019 12:45 PM Medical Record Number: [id number] Patient Account Number: [id number] Date of Birth/Sex: Treating RN: 1955-06-15 (64 y.o. Sharyne Peach, Carrie Primary Care Yashvi Jasinski: MA SO UDI, [place] MA LSA DA T Other Clinician: Referring Gionni Vaca: Treating  Suede Greenawalt/Extender: Baltazar Najjar MA SO UDI, ELHA MA LSA DA T Weeks in Treatment: 8 Vital Signs Time Taken: 13:02 Temperature (F): 98.6 Height (in): 68 Pulse (bpm): 72 Weight (lbs): 265 Respiratory Rate (breaths/min): 18 Body Mass Index (BMI): 40.3 Blood Pressure (mmHg): 94/62 Reference Range: 80 - 120 mg / dl Electronic Signature(s) Signed: 09/25/2019 1:03:45 PM By: Karl Ito Entered By: Karl Ito on 09/24/2019 13:02:21

## 2019-09-30 ENCOUNTER — Other Ambulatory Visit: Payer: Self-pay

## 2019-09-30 ENCOUNTER — Encounter (HOSPITAL_BASED_OUTPATIENT_CLINIC_OR_DEPARTMENT_OTHER): Payer: Medicare Other | Admitting: Internal Medicine

## 2019-09-30 DIAGNOSIS — E11621 Type 2 diabetes mellitus with foot ulcer: Secondary | ICD-10-CM | POA: Diagnosis not present

## 2019-10-02 NOTE — Progress Notes (Signed)
LENDA, BARATTA ([id number]) Visit Report for 09/30/2019 Arrival Information Details Patient Name: Date of Service: ONEISHA, AMMONS 09/30/2019 1:45 PM Medical Record Number: [id number] Patient Account Number: [id number] Date of Birth/Sex: Treating RN: 08/10/1955 (64 y.o. F) Dwiggins, Shannon Primary Care Lillith Mcneff: MA SO UDI, [place] MA LSA DA T Other Clinician: Referring Saylor Sheckler: Treating Japji Kok/Extender: Baltazar Najjar MA SO UDI, ELHA MA LSA DA T Weeks in Treatment: 35 Visit Information History Since Last Visit Added or deleted any medications: No Patient Arrived: Crutches Any new allergies or adverse reactions: No Arrival Time: 14:32 Had a fall or experienced change in No Accompanied By: self activities of daily living that may affect Transfer Assistance: None risk of falls: Patient Identification Verified: Yes Signs or symptoms of abuse/neglect since last visito No Secondary Verification Process Completed: Yes Hospitalized since last visit: No Patient Requires Transmission-Based Precautions: No Implantable device outside of the clinic excluding No Patient Has Alerts: No cellular tissue based products placed in the center since last visit: Has Dressing in Place as Prescribed: Yes Has Compression in Place as Prescribed: Yes Pain Present Now: No Electronic Signature(s) Signed: 09/30/2019 6:04:50 PM By: [name] Entered By: [name] on 09/30/2019 14:33:23 -------------------------------------------------------------------------------- Compression Therapy Details Patient Name: Date of Service: [name], Keerstin J. 09/30/2019 1:45 PM Medical Record Number: [id number] Patient Account Number: [id number] Date of Birth/Sex: Treating RN: 14-Aug-1955 (64 y.o. [name] Primary Care Jayse Hodkinson: MA SO UDI, Judene Companion MA LSA DA T Other Clinician: Referring Neftaly Inzunza: Treating Jelicia Nantz/Extender: Baltazar Najjar MA SO UDI, ELHA MA LSA DA T Weeks in Treatment:  35 Compression Therapy Performed for Wound Assessment: Wound #9 Right,Medial Foot Performed By: Clinician Zandra Abts, RN Compression Type: Three Layer Post Procedure Diagnosis Same as Pre-procedure Electronic Signature(s) Signed: 10/02/2019 5:57:30 PM By: Zandra Abts RN, BSN Entered By: Zandra Abts on 09/30/2019 14:55:32 -------------------------------------------------------------------------------- Encounter Discharge Information Details Patient Name: Date of Service: [name], Rylann J. 09/30/2019 1:45 PM Medical Record Number: [id number] Patient Account Number: [id number] Date of Birth/Sex: Treating RN: [birth year] (64 y.o. [name] Primary Care Joseluis Alessio: MA SO UDI, [place] MA LSA DA T Other Clinician: Referring Cheron Pasquarelli: Treating Jylan Loeza/Extender: Baltazar Najjar MA SO UDI, ELHA MA LSA DA T Weeks in Treatment: 2 Encounter Discharge Information Items Post Procedure Vitals Discharge Condition: Stable Temperature (F): 98.3 Ambulatory Status: Crutches Pulse (bpm): 73 Discharge Destination: Home Respiratory Rate (breaths/min): 18 Transportation: Private Auto Blood Pressure (mmHg): 134/75 Accompanied By: self Schedule Follow-up Appointment: Yes Clinical Summary of Care: Electronic Signature(s) Signed: 09/30/2019 5:15:56 PM By: Shawn Stall Entered By: Shawn Stall on 09/30/2019 17:04:41 -------------------------------------------------------------------------------- Lower Extremity Assessment Details Patient Name: Date of Service: RAMON, BRANT 09/30/2019 1:45 PM Medical Record Number: [id number] Patient Account Number: [id number] Date of Birth/Sex: Treating RN: March 29, 1955 (64 y.o. F) [name] Primary Care Yides Saidi: MA SO UDI, [place] MA LSA DA T Other Clinician: Referring Eleanor Gatliff: Treating Irving Lubbers/Extender: Baltazar Najjar MA SO UDI, ELHA MA LSA DA T Weeks in Treatment: 35 Edema Assessment Assessed: [Left: No] [Right: No] Edema: [Left:  Ye] [Right: s] Calf Left: Right: Point of Measurement: 43 cm From Medial Instep cm 34 cm Ankle Left: Right: Point of Measurement: 12 cm From Medial Instep cm 22 cm Vascular Assessment Pulses: Dorsalis Pedis Palpable: [Right:Yes] Electronic Signature(s) Signed: 09/30/2019 6:04:50 PM By: [name] Entered By: [name] on 09/30/2019 14:39:11 -------------------------------------------------------------------------------- Multi Wound Chart Details Patient Name: Date of Service: [facility], Chaniqua J. 09/30/2019 1:45 PM Medical Record Number: [id number] Patient Account Number:  [id number] Date of Birth/Sex: Treating RN: 1955-03-29 (64 y.o. [name] Primary Care Haruka Kowaleski: MA SO UDI, Judene Companion MA LSA DA T Other Clinician: Referring Ireene Ballowe: Treating Marcanthony Sleight/Extender: Baltazar Najjar MA SO UDI, ELHA MA LSA DA T Weeks in Treatment: 35 Vital Signs Height(in): 68 Pulse(bpm): 73 Weight(lbs): 265 Blood Pressure(mmHg): 134/75 Body Mass Index(BMI): 40 Temperature(F): 98.3 Respiratory Rate(breaths/min): 18 Photos: [9:No Photos Right, Medial Foot] [N/A:N/A N/A] Wound Location: [9:Blister] [N/A:N/A] Wounding Event: [9:Diabetic Wound/Ulcer of the Lower] [N/A:N/A] Primary Etiology: [9:Extremity Glaucoma, Chronic sinus] [N/A:N/A] Comorbid History: [9:problems/congestion, Anemia, Sleep Apnea, Hypertension, Type II Diabetes, Rheumatoid Arthritis, Neuropathy 06/18/2018] [N/A:N/A] Date Acquired: [9:35] [N/A:N/A] Weeks of Treatment: [9:Open] [N/A:N/A] Wound Status: [9:1.3x1.4x0.4] [N/A:N/A] Measurements L x W x D (cm) [9:1.429] [N/A:N/A] A (cm) : rea [9:0.572] [N/A:N/A] Volume (cm) : [9:87.00%] [N/A:N/A] % Reduction in A [9:rea: 94.20%] [N/A:N/A] % Reduction in Volume: [9:12] Starting Position 1 (o'clock): [9:5] Ending Position 1 (o'clock): [9:0.3] Maximum Distance 1 (cm): [9:Yes] [N/A:N/A] Undermining: [9:Grade 2] [N/A:N/A] Classification: [9:Medium] [N/A:N/A] Exudate  A mount: [9:Serosanguineous] [N/A:N/A] Exudate Type: [9:red, brown] [N/A:N/A] Exudate Color: [9:Well defined, not attached] [N/A:N/A] Wound Margin: [9:Large (67-100%)] [N/A:N/A] Granulation A mount: [9:Red, Pink] [N/A:N/A] Granulation Quality: [9:None Present (0%)] [N/A:N/A] Necrotic A mount: [9:Fat Layer (Subcutaneous Tissue): Yes N/A] Exposed Structures: [9:Fascia: No Tendon: No Muscle: No Joint: No Bone: No None] [N/A:N/A] Epithelialization: [9:Debridement - Excisional] [N/A:N/A] Debridement: Pre-procedure Verification/Time Out 14:54 [N/A:N/A] Taken: [9:Callus, Subcutaneous] [N/A:N/A] Tissue Debrided: [9:Skin/Subcutaneous Tissue] [N/A:N/A] Level: [9:1.82] [N/A:N/A] Debridement A (sq cm): [9:rea Curette] [N/A:N/A] Instrument: [9:Minimum] [N/A:N/A] Bleeding: [9:Pressure] [N/A:N/A] Hemostasis A chieved: [9:0] [N/A:N/A] Procedural Pain: [9:0] [N/A:N/A] Post Procedural Pain: [9:Procedure was tolerated well] [N/A:N/A] Debridement Treatment Response: [9:1.3x1.4x0.4] [N/A:N/A] Post Debridement Measurements L x W x D (cm) [9:0.572] [N/A:N/A] Post Debridement Volume: (cm) [9:Compression Therapy] [N/A:N/A] Procedures Performed: [9:Debridement] Treatment Notes Wound #9 (Right, Medial Foot) 1. Cleanse With Wound Cleanser Soap and water 2. Periwound Care Barrier cream Moisturizing lotion 3. Primary Dressing Applied Polymem Ag 4. Secondary Dressing ABD Pad Dry Gauze Foam Kerramax/Xtrasorb 6. Support Layer Applied 3 layer compression wrap Notes foam donut as secondary, stockinette. Electronic Signature(s) Signed: 10/01/2019 5:12:45 PM By: Baltazar Najjar MD Signed: 10/02/2019 5:57:30 PM By: Zandra Abts RN, BSN Entered By: Baltazar Najjar on 10/01/2019 07:30:41 -------------------------------------------------------------------------------- Multi-Disciplinary Care Plan Details Patient Name: Date of Service: [facility], Sahiba J. 09/30/2019 1:45 PM Medical Record Number:  [id number] Patient Account Number: [id number] Date of Birth/Sex: Treating RN: 10-11-1955 (64 y.o. [name] Primary Care Lucas Winograd: MA SO UDI, Judene Companion MA LSA DA T Other Clinician: Referring Malaysha Arlen: Treating Smriti Barkow/Extender: Baltazar Najjar MA SO UDI, ELHA MA LSA DA T Weeks in Treatment: 42 Active Inactive Wound/Skin Impairment Nursing Diagnoses: Impaired tissue integrity Knowledge deficit related to ulceration/compromised skin integrity Goals: Patient/caregiver will verbalize understanding of skin care regimen Date Initiated: 01/28/2019 Target Resolution Date: 11/01/2019 Goal Status: Active Interventions: Assess patient/caregiver ability to obtain necessary supplies Assess patient/caregiver ability to perform ulcer/skin care regimen upon admission and as needed Assess ulceration(s) every visit Provide education on ulcer and skin care Notes: Electronic Signature(s) Signed: 10/02/2019 5:57:30 PM By: Zandra Abts RN, BSN Entered By: Zandra Abts on 09/30/2019 14:56:19 -------------------------------------------------------------------------------- Pain Assessment Details Patient Name: Date of Service: [name], Shawn J. 09/30/2019 1:45 PM Medical Record Number: [id number] Patient Account Number: [id number] Date of Birth/Sex: Treating RN: March 11, 1955 (64 y.o. F) [name] Primary Care Juneau Doughman: MA SO UDI, [place] MA LSA DA T Other Clinician: Referring Kimmie Berggren: Treating Charika Mikelson/Extender: Melissa Noon MA  LSA DA T Weeks in Treatment: 35 Active Problems Location of Pain Severity and Description of Pain Patient Has Paino No Site Locations Pain Management and Medication Current Pain Management: Electronic Signature(s) Signed: 09/30/2019 6:04:50 PM By: [name] Entered By: [name] on 09/30/2019 14:34:36 -------------------------------------------------------------------------------- Patient/Caregiver Education  Details Patient Name: Date of Service: Gypsy Decant 9/13/2021andnbsp1:45 PM Medical Record Number: [id number] Patient Account Number: [id number] Date of Birth/Gender: Treating RN: [birth year] (64 y.o. [name] Primary Care Physician: MA SO UDI, Judene Companion MA LSA DA T Other Clinician: Referring Physician: Treating Physician/Extender: Baltazar Najjar MA SO UDI, ELHA MA LSA DA T Weeks in Treatment: 10 Education Assessment Education Provided To: Patient Education Topics Provided Wound/Skin Impairment: Methods: Explain/Verbal Responses: State content correctly [employer]) Signed: 10/02/2019 5:57:30 PM By: Zandra Abts RN, BSN Entered By: Zandra Abts on 09/30/2019 14:56:37 -------------------------------------------------------------------------------- Wound Assessment Details Patient Name: Date of Service: [name], Maya J. 09/30/2019 1:45 PM Medical Record Number: [id number] Patient Account Number: [id number] Date of Birth/Sex: Treating RN: 11-Nov-1955 (63 y.o. F) Dwiggins, Shannon Primary Care Quintavius Niebuhr: MA SO UDI, [place] MA LSA DA T Other Clinician: Referring Chisum Habenicht: Treating Jaydyn Bozzo/Extender: Baltazar Najjar MA SO UDI, ELHA MA LSA DA T Weeks in Treatment: 35 Wound Status Wound Number: 9 Primary Diabetic Wound/Ulcer of the Lower Extremity Etiology: Wound Location: Right, Medial Foot Wound Open Wounding Event: Blister Status: Date Acquired: 06/18/2018 Comorbid Glaucoma, Chronic sinus problems/congestion, Anemia, Sleep Weeks Of Treatment: 35 History: Apnea, Hypertension, Type II Diabetes, Rheumatoid Arthritis, Clustered Wound: No Neuropathy Photos Photo Uploaded By: Benjaman Kindler on 10/02/2019 12:04:07 Wound Measurements Length: (cm) 1.3 Width: (cm) 1.4 Depth: (cm) 0.4 Area: (cm) 1.429 Volume: (cm) 0.572 % Reduction in Area: 87% % Reduction in Volume: 94.2% Epithelialization: None Tunneling: No Undermining: Yes Starting Position  (o'clock): 12 Ending Position (o'clock): 5 Maximum Distance: (cm) 0.3 Wound Description Classification: Grade 2 Wound Margin: Well defined, not attached Exudate Amount: Medium Exudate Type: Serosanguineous Exudate Color: red, brown Foul Odor After Cleansing: No Slough/Fibrino No Wound Bed Granulation Amount: Large (67-100%) Exposed Structure Granulation Quality: Red, Pink Fascia Exposed: No Necrotic Amount: None Present (0%) Fat Layer (Subcutaneous Tissue) Exposed: Yes Tendon Exposed: No Muscle Exposed: No Joint Exposed: No Bone Exposed: No Treatment Notes Wound #9 (Right, Medial Foot) 1. Cleanse With Wound Cleanser Soap and water 2. Periwound Care Barrier cream Moisturizing lotion 3. Primary Dressing Applied Polymem Ag 4. Secondary Dressing ABD Pad Dry Gauze Foam Kerramax/Xtrasorb 6. Support Layer Applied 3 layer compression wrap Notes foam donut as secondary, stockinette. Electronic Signature(s) Signed: 09/30/2019 6:04:50 PM By: [name] Entered By: [name] on 09/30/2019 14:40:54 -------------------------------------------------------------------------------- Vitals Details Patient Name: Date of Service: [facility], Fernando J. 09/30/2019 1:45 PM Medical Record Number: [id number] Patient Account Number: [id number] Date of Birth/Sex: Treating RN: 24-Apr-1955 (64 y.o. F) Dwiggins, Shannon Primary Care Hildred Mollica: MA SO UDI, [place] MA LSA DA T Other Clinician: Referring Shanan Fitzpatrick: Treating Deaire Mcwhirter/Extender: Baltazar Najjar MA SO UDI, ELHA MA LSA DA T Weeks in Treatment: 35 Vital Signs Time Taken: 14:34 Temperature (F): 98.3 Height (in): 68 Pulse (bpm): 73 Weight (lbs): 265 Respiratory Rate (breaths/min): 18 Body Mass Index (BMI): 40.3 Blood Pressure (mmHg): 134/75 Reference Range: 80 - 120 mg / dl Electronic Signature(s) Signed: 09/30/2019 6:04:50 PM By: [name] Entered By: [name] on 09/30/2019 14:34:30

## 2019-10-02 NOTE — Progress Notes (Signed)
[name], HASELEY ([id number]) Visit Report for 09/30/2019 Debridement Details Patient Name: Date of Service: Sarah, [name] 09/30/2019 1:45 PM Medical Record Number: [id number] Patient Account Number: [id number] Date of Birth/Sex: Treating RN: [birth year] (64 y.o. [name] Primary Care Provider: MA SO UDI, [place] MA LSA DA T Other Clinician: Referring Provider: Treating Provider/Extender: Baltazar Najjar MA SO UDI, ELHA MA LSA DA T Weeks in Treatment: 35 Debridement Performed for Assessment: Wound #9 Right,Medial Foot Performed By: Physician [name], MD Debridement Type: Debridement Severity of Tissue Pre Debridement: Fat layer exposed Level of Consciousness (Pre-procedure): Awake and Alert Pre-procedure Verification/Time Out Yes - 14:54 Taken: Start Time: 14:54 T Area Debrided (L x W): otal 1.3 (cm) x 1.4 (cm) = 1.82 (cm) Tissue and other material debrided: Viable, Non-Viable, Callus, Subcutaneous Level: Skin/Subcutaneous Tissue Debridement Description: Excisional Instrument: Curette Bleeding: Minimum Hemostasis Achieved: Pressure End Time: 14:55 Procedural Pain: 0 Post Procedural Pain: 0 Response to Treatment: Procedure was tolerated well Level of Consciousness (Post- Awake and Alert procedure): Post Debridement Measurements of Total Wound Length: (cm) 1.3 Width: (cm) 1.4 Depth: (cm) 0.4 Volume: (cm) 0.572 Character of Wound/Ulcer Post Debridement: Improved Severity of Tissue Post Debridement: Fat layer exposed Post Procedure Diagnosis Same as Pre-procedure Electronic Signature(s) Signed: 10/01/2019 5:12:45 PM By: Baltazar Najjar MD Signed: 10/02/2019 5:57:30 PM By: Zandra Abts RN, BSN Entered By: Baltazar Najjar on 10/01/2019 07:30:54 -------------------------------------------------------------------------------- HPI Details Patient Name: Date of Service: [name], [name] 09/30/2019 1:45 PM Medical Record Number: [id number] Patient  Account Number: [id number] Date of Birth/Sex: Treating RN: 1955-05-19 (64 y.o. [name] Primary Care Provider: MA SO UDI, [place] MA LSA DA T Other Clinician: Referring Provider: Treating Provider/Extender: Baltazar Najjar MA SO UDI, ELHA MA LSA DA T Weeks in Treatment: 35 History of Present Illness HPI Description: 64 yrs old bf here for follow up recurrent left charcot foot ulcer. She has DM. She is not a smoker. She states a blister developed 3 months ago at site of previous breakdown from 1 year ago. It was debrided at the podiatrist's office. She went to the ER and then sent here. She denies pain. Xray was negative for osteo. Culture from this clinic negative. 09/08/14 [address] [name] to [name] for Charcot foot. Seen by him and MRI scheduled 09/19/14. Prior silver collagen but this was not ordered last visit, patient has been rinsing with saline alone. Re institute silver collagen every other day. F/u 2 weeks with [name] as Labor Day holiday. Supplies ordered. 09/25/14; this is a patient with a Charcot foot on the left. she has a wound over the plantar aspect of her left calcaneus. She has been seen by [name] of orthopedic surgery and has had an MRI. She is apparently going within the next week or 2 for corrective surgery which will involve an external fixator. I don't have any of the details of this. 10/06/14; The patient is a 64 yrs old bf with a left Charcot foot and ulcer on the plantar. 12/01/14 Returns post surgery with [name]. Has follow up visit later this week with him, currently in facility and NWB over this extremity. States no drainage from foot and no current wound care. On exam callus and scab in place, suspect healed. Has external fixator in place. Will defer to [name] for management, ok to place moisturizing cream over scabs and callus and she will call if she requires follow up here. READMISSION 01/28/2019 Patient was in the  clinic in 2016 for a  wound on the plantar aspect of her left calcaneus. Predominantly cared for by [name] she was referred to [name] and had corrective surgery for the position of her left ankle I believe. She had previously been here in 2015 and then prior to that in 2011 2012 that I do not have these records. More recently she has developed fairly extensive wounds on the right medial foot, left lateral foot and a small area on the right medial great toe. Right medial foot wound has been there for 6 to 7 months, left foot wound for 2 to 3 months and the right great toe only over the last few weeks. She has been followed by [name] at Dr. Laverta [place] office it sounds as though she has been undergoing callus paring and silver nitrate. The patient has been washing these off with soap and water and plan applying dry dressing Past medical history, type 2 diabetes well controlled with a recent hemoglobin A1c of 7 on 11/16, type 2 diabetes with peripheral neuropathy, previous left Charcot joint surgery bilateral Charcot joints currently, stage III chronic renal failure, hypertension, obstructive sleep apnea, history of MRSA and gastroesophageal reflux. 1/18; this is a patient with bilateral Charcot feet. Plain x-rays that I did of both of these wounds that are either at bone or precariously close to bone were done. On the right foot this showed possible lytic destruction involving the anterior aspect of the talus which may represent a wound osteomyelitis. An MRI was recommended. On the left there was no definite lytic destruction although the wound is deep undermining's and I think probably requires an MRI on this basis in any case 1/25; patient was supposed to have her MRI last Friday of her bilateral feet however they would not do it because there was silver alginate in her dressings, will replace with calcium alginate today and will see if we can get her done today 2/1; patient did not get her MRIs done last  week because of issues with the MRI department receiving orders to do bilateral feet. She has 2 deep wounds in the setting of Charcot feet deformities bilaterally. Both of them at one point had exposed bone although I had trouble demonstrating that today. I am not sure that we can offload these very aggressively as I watched her leave the clinic last week her gait is very narrow and unsteady. 2/15; the MRI of her right foot did not show osteomyelitis potentially osteonecrosis of the talus. 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 The wounds are a large wound on the right medial foot and on the left a substantial wound on the left lateral plantar calcaneus. We have been using silver alginate. Ultimately I think we need to consider a total contact cast on the right while we work through the possibility of osteomyelitis in the left. I watched her walk out with her crutches I have some trepidation about the ability to walk with the cast on her right foot. Nevertheless with her Charcot deformities I do not think there is a way to heal these short of offloading them aggressively. 2/22; the culture of the bone in the left foot that I did last week showed a few Citrobacter Koseri as well as some streptococcal species. In my attempted bone for pathology did not actually yield a lot of bone the pathologist did not identify any osteomyelitis. Nevertheless based on the MRI, bone for culture  and the probing wound into the bone itself I think this woman has osteomyelitis in the left foot and we will refer her to infectious disease. Is promised today and aggressive debridement of the right foot and a total contact cast which surprisingly she seemed to tolerate quite well 03/13/2019 upon evaluation today patient appears to be doing well with her total contact cast she is here for the first cast change. She had no areas of rubbing or any other complications at this  point. Overall things are doing quite well. 2/26; the patient was kindly seen by Dr. Megan Salon of infectious disease on 2/25. She is now on IV vancomycin PICC line placement in 2 days and oral Flagyl. This was in response to her MRI showing osteomyelitis of the left calcaneus concerning for osteomyelitis. We have been putting her right leg in a total contact cast. Our nurses report drainage. She is tolerating the total contact cast well. 3/4; the patient is started her IV antibiotics and is taking IV vancomycin and oral Flagyl. She appears to be tolerating these both well. This is for osteomyelitis involving the left calcaneus. The area on the right in the setting of bilateral Charcot deformities did not have any bone infection on MRI. We put a total contact cast on her with silver alginate as the primary dressing and she returns today in follow-up. Per our intake nurse there was too much drainage to leave this on all week therefore we will bring her back for an early change 3/8; follow-up total contact cast she is still having a lot of drainage probably too much drainage from the right foot wound will week with the same cast. She will be back on Thursday. The area on the left looks somewhat better 3/11; patient here for a total contact cast change. 3/15; patient came in for wound care evaluation. She is still on IV vancomycin and oral Flagyl for osteomyelitis in the left foot. The area on the right foot we are putting in a total contact cast. 3/18 for total contact cast change on the right foot 3/22; the patient was here for wound review today. The area on the right foot is slightly smaller in terms of surface area. However the surface of the wound is very gritty and hyper granulated. More problematically the area on the plantar left foot has increased depth indeed in the center of this it probes to bone. We have been using Hydrofera Blue in both areas. She is on antibiotics as prescribed by  infectious disease IV vancomycin and oral Flagyl 3/25; the patient is in for total contact cast change. Our intake nurse was concerned about the amount of drainage which soaked right to the multiple layers we are putting on this. Wondered about options. The wound bed actually looks as healthy as this is looked although there may not be a lot of change in dimensions things are looking a lot better. If we are going to heal this woman's wound which is in the middle of her right Charcot foot this is going to need to be offloaded. There are not many alternatives 3/29; still not a lot of improvement although the surface of the right wound looks better. We have been using Hydrofera Blue 04/17/2019 upon evaluation today patient appears to be doing well with a total contact cast. With actually having to change this twice a week. She saw Dr. Dellia Nims for wound care earlier in the week this is for the cast change today. That is due  to the fact that she has significant drainage. Overall the wound is been looking excellent however. 4/5; we continue to put a total contact cast on this patient with Hydrofera Blue. Still a lot of drainage which precludes a simple weekly change or changing this twice a week. 4/8; total contact cast changed today. Apparently the wound looks better per our intake nurse 4/12; patient here for wound care evaluation. Wounds on the right foot have not changed in dimension none about a month left foot looks similar. Apparently her antibiotics that we are giving her for osteomyelitis in the left foot complete on Wednesday. We have been using Hydrofera Blue on the right foot under a total contact cast. Nurses report greenish drainage although I think this may be discoloration from the [facility] 4/15; back for cast change wounds look quite good although still moderate amount of drainage. T contact cast reapplied otal 4/19;. Wound evaluation. Wound on the right foot is smaller surface looks  healthy. There is still the divot in the middle part of this wound but I could not feel any bone. Area on the left foot also looks better She has completed her antibiotics but still has the PICC line in. 4/23; patient comes back for a total contact cast change this was done in the standard fashion no issues 4/26; wound measures slightly larger on the right foot which is disappointing. She sees Dr. Orvan Falconer with regards to her antibiotics on Wednesday. I believe she is on vancomycin IV and oral Flagyl. 4/29; in for her routine total contact cast change. She saw Dr. Orvan Falconer this morning. He is asking about antibiotic continuation I will be in touch with him. I did not look at her wound today 5/3; patient saw Dr. Orvan Falconer on 4/28. I did send him a note asking about the continuation of perhaps an oral regimen. I have not heard back. She still has an elevated sedimentation rate at 81. Her wounds are smaller. 5/6; in for her total contact cast change. We did not look at the wound today. She has seen Dr. Orvan Falconer and is now on oral Keflex and Flagyl. She seems to be tolerating these well 5/10; she had her PICC line removed. The wound on the right foot after some initial improvement has not made any improvement even with the total contact cast. Still having a lot of drainage. Likewise the area on the left foot really is not any better either 5/14; in for a cast change today. We will look at the right foot on Monday. If this is not making any progress I am going to try to cast the left foot. 5/17; the wound on the right foot is absolutely no different. I am going to put ointment on this area and change the cast of the left foot. We have been using silver alginate there 5/21; apparently the area on the right foot was better per our intake nurse although I did not see it. We did a standard total contact cast change on the left foot we have been applying silver alginate to the wound here. 06/10/19-Area on the  right foot per dimensions slightly better, the left foot has still some depth applying silver alginate to the wound with TCC 06/13/19-Patient here for Casting to Left foot 6/1; patient had the total contact cast were placed on the left foot. Noted some swelling in her right leg although the dimensions of the wound on the right foot are better. We have been using PolyMem on the  right and silver alginate on the left under the cast 6/7; we continue to make nice progress in both her plantar feet wound. We are putting the total contact cast on the left. Surprisingly in spite of this the area on the right medial foot in the middle of her Charcot deformities actually is making progress as well 07/01/2019 upon evaluation today patient actually appears to be doing quite well with regard to her wounds on the feet compared to when I saw her last that has been quite some time. With that being said I do believe the total contact cast has been beneficial in this left foot and she has a lot of signs of improvement she does have some callus overhanging which is caused a little bit of a ledge and an issue here. I do believe that we may be able to do something to help in that regard. Fortunately otherwise the wound seems to be progressing quite nicely. 6/21; the patient's area on the left foot that we are currently casting is just about closed. The area on the right medial foot has a healthy looking surface but may be not changed as much from last week in terms of surface area. We are using silver alginate on the right and polymen Ag under the cast 6/28; only a small open area on the left foot remains. Using silver alginate under a total contact cast. She has a diabetic shoe I have asked her to bring that for next week where this may be closed. The area on the right foot with a Charcot deformity is also measuring slightly smaller. We are using PolyMem Ag here 7/6; left foot is totally closed. She does not require a total  contact cast. She will graduate to her own diabetic shoe. The area on the right foot with a Charcot deformity measuring about the same. We have been using polymen under compression wraps. She did not do well in this area with a total contact cast 7/13; left foot remains intact. She is skillfully covering this area with foam and using her diabetic shoe. Slight area improvement of the right foot wound. Still rolled senescent looking edges. We have been using polymen 7/19; left foot remains intact. Wound measuring slightly smaller on the right foot in the middle of the Charcot deformity. We have been using PolyMem Ag 7/26; per the patient left foot remains intact. The wound on the right foot looks slightly smaller but dimensions are measuring the same. We have been using polymen Ag 8/9-Patient back after 2 weeks, wound of the right foot again looking slightly smaller, continuing to use PolyMem silver 8/16; patient has made some minor improvements in wound area on the right foot. Using polymen Ag. The left foot remains healed 8/23; continued slight improvement in dimensions. Rolled edges around the wound edge noted. We have been using polymen Ag. Patient offloading with a surgical shoe and crutches 8/30; no change in surface area. Thick rolled skin and subcutaneous tissue around the edges debris on the wound surface. We have been using polymen Ag initially with some improvement however stalled on the last 2 weeks in terms of surface area 9/13; surface area is smaller still using polymen Ag. Thick rolled skin and subcutaneous tissue around the edges requiring debridement. Electronic Signature(s) Signed: 10/01/2019 5:12:45 PM By: Baltazar Najjar MD Entered By: Baltazar Najjar on 10/01/2019 07:31:24 -------------------------------------------------------------------------------- Physical Exam Details Patient Name: Date of Service: [name], [name] 09/30/2019 1:45 PM Medical Record Number:  [id number] Patient Account  Number: [id number] Date of Birth/Sex: Treating RN: 07-18-1955 (64 y.o. [name] Primary Care Provider: MA SO UDI, [place] MA LSA DA T Other Clinician: Referring Provider: Treating Provider/Extender: Baltazar Najjar MA SO UDI, ELHA MA LSA DA T Weeks in Treatment: 35 Constitutional Sitting or standing Blood Pressure is within target range for patient.. Pulse regular and within target range for patient.Marland Kitchen Respirations regular, non-labored and within target range.. Temperature is normal and within the target range for the patient.Marland Kitchen Appears in no distress. Notes Wound exam; right foot again raised edges of callus and skin around the edges. Slight amount of debris on the surface. Again an aggressive debridement with a #5 curette same as we did last week. I am able to get this to what appears to be a healthy wound base and surrounding edges that are around the level of the wound bed. Hemostasis with direct pressure Electronic Signature(s) Signed: 10/01/2019 5:12:45 PM By: Baltazar Najjar MD Entered By: Baltazar Najjar on 10/01/2019 07:32:48 -------------------------------------------------------------------------------- Physician Orders Details Patient Name: Date of Service: [facility], [name] 09/30/2019 1:45 PM Medical Record Number: [id number] Patient Account Number: [id number] Date of Birth/Sex: Treating RN: 1955-04-18 (64 y.o. 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 Dressing Change Frequency Wound #9 Right,Medial Foot Do not change entire dressing for one week. Wound Cleansing Wound #9 Right,Medial Foot May shower with protection. - use cast protector Primary Wound Dressing Wound #9 Right,Medial Foot Polymem Silver Secondary Dressing Wound #9 Right,Medial Foot Foam - foam donut ABD pad Zetuvit or Kerramax Edema Control 3 Layer Compression System - Right Lower Extremity Avoid standing for long periods of time Elevate legs to the level of the heart or above for 30 minutes daily and/or when sitting, a frequency of: - throughout the day Exercise regularly Off-Loading Open toe surgical shoe to: - right foot Electronic Signature(s) Signed: 10/01/2019 5:12:45 PM By: Baltazar Najjar MD Signed: 10/02/2019 5:57:30 PM By: Zandra Abts RN, BSN Entered By: Zandra Abts on 09/30/2019 14:53:56 -------------------------------------------------------------------------------- Problem List Details Patient Name: Date of Service: [name], [name] 09/30/2019 1:45 PM Medical Record Number: [id number] Patient Account Number: [id number] Date of Birth/Sex: Treating RN: 11-19-1955 (64 y.o. [name] Primary Care Provider: MA SO UDI, [place] MA LSA DA T Other Clinician: Referring Provider: Treating Provider/Extender: Abe People, ELHA MA LSA DA T Weeks in Treatment: 72 Active Problems ICD-10 Encounter Code Description Active Date MDM Diagnosis E11.621 Type 2 diabetes mellitus with foot ulcer 01/28/2019 No Yes L97.511 Non-pressure chronic ulcer of other part of right foot limited to breakdown of 01/28/2019 No Yes skin E11.42 Type 2 diabetes mellitus with diabetic polyneuropathy 01/28/2019 No Yes M14.671 Charcot's joint, right ankle and foot 01/28/2019 No Yes M14.672 Charcot's joint, left  ankle and foot 01/28/2019 No Yes 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, left ankle and foot 04/08/2019 No Yes Inactive Problems ICD-10 Code Description Active Date Inactive Date L97.524 Non-pressure chronic ulcer  of other part of left foot with necrosis of bone 01/28/2019 01/28/2019 L97.514 Non-pressure chronic ulcer of other part of right foot with necrosis of bone 01/28/2019 01/28/2019 Resolved Problems Electronic Signature(s) Signed: 10/01/2019 5:12:45 PM By: Baltazar Najjar MD Entered By: Baltazar Najjar on 10/01/2019 07:30:31 -------------------------------------------------------------------------------- Progress Note Details Patient Name: Date of Service: [facility], [name] 09/30/2019 1:45 PM Medical Record Number: [id number] Patient Account Number: [id number] Date of Birth/Sex: Treating RN: 05-11-1955 (64 y.o. [name] Primary Care Provider: MA SO UDI, [place] MA LSA DA T Other Clinician: Referring Provider: Treating Provider/Extender: Baltazar Najjar MA SO UDI, ELHA MA LSA DA T Weeks in Treatment: 35 Subjective History of Present Illness (HPI) 64 yrs old bf here for follow up recurrent left charcot foot ulcer. She has DM. She is not a smoker. She states a blister developed 3 months ago at site of previous breakdown from 1 year ago. It was debrided at the podiatrist's office. She went to the ER and then sent here. She denies pain. Xray was negative for osteo. Culture from this clinic negative. 09/08/14 [name] to [name] for Charcot foot. Seen by him and MRI scheduled 09/19/14. Prior silver collagen but this was not ordered last visit, patient has been rinsing with saline alone. Re institute silver collagen every other day. F/u 2 weeks with [name] as Labor Day holiday. Supplies ordered. 09/25/14; this is a patient with a Charcot foot on the left. she has a wound over the plantar aspect of her left calcaneus. She has been seen by [name] of orthopedic surgery  and has had an MRI. She is apparently going within the next week or 2 for corrective surgery which will involve an external fixator. I don't have any of the details of this. 10/06/14; The patient is a 64 yrs old bf with a left Charcot foot and ulcer on the plantar. 12/01/14 Returns post surgery with [name]. Has follow up visit later this week with him, currently in facility and NWB over this extremity. States no drainage from foot and no current wound care. On exam callus and scab in place, suspect healed. Has external fixator in place. Will defer to [name] for management, ok to place moisturizing cream over scabs and callus and she will call if she requires follow up here. READMISSION 01/28/2019 Patient was in the clinic in 2016 for a wound on the plantar aspect of her left calcaneus. Predominantly cared for by [name] she was referred to [name] and had corrective surgery for the position of her left ankle I believe. She had previously been here in 2015 and then prior to that in 2011 2012 that I do not have these records. More recently she has developed fairly extensive wounds on the right medial foot, left lateral foot and a small area on the right medial great toe. Right medial foot wound has been there for 6 to 7 months, left foot wound for 2 to 3 months and the right great toe only over the last few weeks. She has been followed by [name] at Dr. Laverta [place] office it sounds as though she has been undergoing callus paring and silver nitrate. The patient has been washing these off with soap and water and plan applying dry dressing Past medical history, type 2 diabetes well controlled with a recent hemoglobin A1c of 7 on 11/16, type 2 diabetes with peripheral neuropathy, previous left Charcot joint surgery bilateral Charcot joints currently, stage III chronic  renal failure, hypertension, obstructive sleep apnea, history of MRSA and gastroesophageal reflux. 1/18; this is a  patient with bilateral Charcot feet. Plain x-rays that I did of both of these wounds that are either at bone or precariously close to bone were done. On the right foot this showed possible lytic destruction involving the anterior aspect of the talus which may represent a wound osteomyelitis. An MRI was recommended. On the left there was no definite lytic destruction although the wound is deep undermining's and I think probably requires an MRI on this basis in any case 1/25; patient was supposed to have her MRI last Friday of her bilateral feet however they would not do it because there was silver alginate in her dressings, will replace with calcium alginate today and will see if we can get her done today 2/1; patient did not get her MRIs done last week because of issues with the MRI department receiving orders to do bilateral feet. She has 2 deep wounds in the setting of Charcot feet deformities bilaterally. Both of them at one point had exposed bone although I had trouble demonstrating that today. I am not sure that we can offload these very aggressively as I watched her leave the clinic last week her gait is very narrow and unsteady. 2/15; the MRI of her right foot did not show osteomyelitis potentially osteonecrosis of the talus. 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 The wounds are a large wound on the right medial foot and on the left a substantial wound on the left lateral plantar calcaneus. We have been using silver alginate. Ultimately I think we need to consider a total contact cast on the right while we work through the possibility of osteomyelitis in the left. I watched her walk out with her crutches I have some trepidation about the ability to walk with the cast on her right foot. Nevertheless with her Charcot deformities I do not think there is a way to heal these short of offloading them aggressively. 2/22; the culture of the bone in  the left foot that I did last week showed a few Citrobacter Koseri as well as some streptococcal species. In my attempted bone for pathology did not actually yield a lot of bone the pathologist did not identify any osteomyelitis. 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 Is promised today and aggressive debridement of the right foot and a total contact cast which surprisingly she seemed to tolerate quite well 03/13/2019 upon evaluation today patient appears to be doing well with her total contact cast she is here for the first cast change. She had no areas of rubbing or any other complications at this point. Overall things are doing quite well. 2/26; the patient was kindly seen by Dr. Orvan Falconer of infectious disease on 2/25. She is now on IV vancomycin PICC line placement in 2 days and oral Flagyl. This was in response to her MRI showing osteomyelitis of the left calcaneus concerning for osteomyelitis. We have been putting her right leg in a total contact cast. Our nurses report drainage. She is tolerating the total contact cast well. 3/4; the patient is started her IV antibiotics and is taking IV vancomycin and oral Flagyl. She appears to be tolerating these both well. This is for osteomyelitis involving the left calcaneus. The  area on the right in the setting of bilateral Charcot deformities did not have any bone infection on MRI. We put a total contact cast on her with silver alginate as the primary dressing and she returns today in follow-up. Per our intake nurse there was too much drainage to leave this on all week therefore we will bring her back for an early change 3/8; follow-up total contact cast she is still having a lot of drainage probably too much drainage from the right foot wound will week with the same cast. She will be back on Thursday. The  area on the left looks somewhat better 3/11; patient here for a total contact cast change. 3/15; patient came in for wound care evaluation. She is still on IV vancomycin and oral Flagyl for osteomyelitis in the left foot. The area on the right foot we are putting in a total contact cast. 3/18 for total contact cast change on the right foot 3/22; the patient was here for wound review today. The area on the right foot is slightly smaller in terms of surface area. However the surface of the wound is very gritty and hyper granulated. More problematically the area on the plantar left foot has increased depth indeed in the center of this it probes to bone. We have been using Hydrofera Blue in both areas. She is on antibiotics as prescribed by infectious disease IV vancomycin and oral Flagyl 3/25; the patient is in for total contact cast change. Our intake nurse was concerned about the amount of drainage which soaked right to the multiple layers we are putting on this. Wondered about options. The wound bed actually looks as healthy as this is looked although there may not be a lot of change in dimensions things are looking a lot better. If we are going to heal this woman's wound which is in the middle of her right Charcot foot this is going to need to be offloaded. There are not many alternatives 3/29; still not a lot of improvement although the surface of the right wound looks better. We have been using Hydrofera Blue 04/17/2019 upon evaluation today patient appears to be doing well with a total contact cast. With actually having to change this twice a week. She saw [name] for wound care earlier in the week this is for the cast change today. That is due to the fact that she has significant drainage. Overall the wound is been looking excellent however. 4/5; we continue to put a total contact cast on this patient with Hydrofera Blue. Still a lot of drainage which precludes a simple weekly change or  changing this twice a week. 4/8; total contact cast changed today. Apparently the wound looks better per our intake nurse 4/12; patient here for wound care evaluation. Wounds on the right foot have not changed in dimension none about a month left foot looks similar. Apparently her antibiotics that we are giving her for osteomyelitis in the left foot complete on Wednesday. We have been using Hydrofera Blue on the right foot under a total contact cast. Nurses report greenish drainage although I think this may be discoloration from the [facility] 4/15; back for cast change wounds look quite good although still moderate amount of drainage. T contact cast reapplied otal 4/19;. Wound evaluation. Wound on the right foot is smaller surface looks healthy. There is still the divot in the middle part of this wound but I could not feel any bone. Area on the left foot also  looks better She has completed her antibiotics but still has the PICC line in. 4/23; patient comes back for a total contact cast change this was done in the standard fashion no issues 4/26; wound measures slightly larger on the right foot which is disappointing. She sees Dr. Orvan Falconer with regards to her antibiotics on Wednesday. I believe she is on vancomycin IV and oral Flagyl. 4/29; in for her routine total contact cast change. She saw Dr. Orvan Falconer this morning. He is asking about antibiotic continuation I will be in touch with him. I did not look at her wound today 5/3; patient saw Dr. Orvan Falconer on 4/28. I did send him a note asking about the continuation of perhaps an oral regimen. I have not heard back. She still has an elevated sedimentation rate at 81. Her wounds are smaller. 5/6; in for her total contact cast change. We did not look at the wound today. She has seen Dr. Orvan Falconer and is now on oral Keflex and Flagyl. She seems to be tolerating these well 5/10; she had her PICC line removed. The wound on the right foot after some  initial improvement has not made any improvement even with the total contact cast. Still having a lot of drainage. Likewise the area on the left foot really is not any better either 5/14; in for a cast change today. We will look at the right foot on Monday. If this is not making any progress I am going to try to cast the left foot. 5/17; the wound on the right foot is absolutely no different. I am going to put ointment on this area and change the cast of the left foot. We have been using silver alginate there 5/21; apparently the area on the right foot was better per our intake nurse although I did not see it. We did a standard total contact cast change on the left foot we have been applying silver alginate to the wound here. 06/10/19-Area on the right foot per dimensions slightly better, the left foot has still some depth applying silver alginate to the wound with TCC 06/13/19-Patient here for Casting to Left foot 6/1; patient had the total contact cast were placed on the left foot. Noted some swelling in her right leg although the dimensions of the wound on the right foot are better. We have been using PolyMem on the right and silver alginate on the left under the cast 6/7; we continue to make nice progress in both her plantar feet wound. We are putting the total contact cast on the left. Surprisingly in spite of this the area on the right medial foot in the middle of her Charcot deformities actually is making progress as well 07/01/2019 upon evaluation today patient actually appears to be doing quite well with regard to her wounds on the feet compared to when I saw her last that has been quite some time. With that being said I do believe the total contact cast has been beneficial in this left foot and she has a lot of signs of improvement she does have some callus overhanging which is caused a little bit of a ledge and an issue here. I do believe that we may be able to do something to help in that  regard. Fortunately otherwise the wound seems to be progressing quite nicely. 6/21; the patient's area on the left foot that we are currently casting is just about closed. The area on the right medial foot has a healthy looking surface but  may be not changed as much from last week in terms of surface area. We are using silver alginate on the right and polymen Ag under the cast 6/28; only a small open area on the left foot remains. Using silver alginate under a total contact cast. She has a diabetic shoe I have asked her to bring that for next week where this may be closed. The area on the right foot with a Charcot deformity is also measuring slightly smaller. We are using PolyMem Ag here 7/6; left foot is totally closed. She does not require a total contact cast. She will graduate to her own diabetic shoe. ooThe area on the right foot with a Charcot deformity measuring about the same. We have been using polymen under compression wraps. She did not do well in this area with a total contact cast 7/13; left foot remains intact. She is skillfully covering this area with foam and using her diabetic shoe. Slight area improvement of the right foot wound. Still rolled senescent looking edges. We have been using polymen 7/19; left foot remains intact. Wound measuring slightly smaller on the right foot in the middle of the Charcot deformity. We have been using PolyMem Ag 7/26; per the patient left foot remains intact. The wound on the right foot looks slightly smaller but dimensions are measuring the same. We have been using polymen Ag 8/9-Patient back after 2 weeks, wound of the right foot again looking slightly smaller, continuing to use PolyMem silver 8/16; patient has made some minor improvements in wound area on the right foot. Using polymen Ag. The left foot remains healed 8/23; continued slight improvement in dimensions. Rolled edges around the wound edge noted. We have been using polymen Ag. Patient  offloading with a surgical shoe and crutches 8/30; no change in surface area. Thick rolled skin and subcutaneous tissue around the edges debris on the wound surface. We have been using polymen Ag initially with some improvement however stalled on the last 2 weeks in terms of surface area 9/13; surface area is smaller still using polymen Ag. Thick rolled skin and subcutaneous tissue around the edges requiring debridement. Objective Constitutional Sitting or standing Blood Pressure is within target range for patient.. Pulse regular and within target range for patient.Marland Kitchen Respirations regular, non-labored and within target range.. Temperature is normal and within the target range for the patient.Marland Kitchen Appears in no distress. Vitals Time Taken: 2:34 PM, Height: 68 in, Weight: 265 lbs, BMI: 40.3, Temperature: 98.3 F, Pulse: 73 bpm, Respiratory Rate: 18 breaths/min, Blood Pressure: 134/75 mmHg. General Notes: Wound exam; right foot again raised edges of callus and skin around the edges. Slight amount of debris on the surface. Again an aggressive debridement with a #5 curette same as we did last week. I am able to get this to what appears to be a healthy wound base and surrounding edges that are around the level of the wound bed. Hemostasis with direct pressure Integumentary (Hair, Skin) Wound #9 status is Open. Original cause of wound was Blister. The wound is located on the Right,Medial Foot. The wound measures 1.3cm length x 1.4cm width x 0.4cm depth; 1.429cm^2 area and 0.572cm^3 volume. There is Fat Layer (Subcutaneous Tissue) exposed. There is no tunneling noted, however, there is undermining starting at 12:00 and ending at 5:00 with a maximum distance of 0.3cm. There is a medium amount of serosanguineous drainage noted. The wound margin is well defined and not attached to the wound base. There is large (67-100%) red, pink  granulation within the wound bed. There is no necrotic tissue within the wound  bed. 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 There was a Excisional Skin/Subcutaneous Tissue Debridement with a total area of 1.82 sq cm performed by [name], MD. With the following instrument(s): Curette to remove Viable and Non-Viable tissue/material. Material removed includes Callus and Subcutaneous Tissue and. No specimens were taken. A time out was conducted at 14:54, prior to the start of the procedure. A Minimum amount of bleeding was controlled with Pressure. The procedure was tolerated well with a pain level of 0 throughout and a pain level of 0 following the procedure. Post Debridement Measurements: 1.3cm length x 1.4cm width x 0.4cm depth; 0.572cm^3 volume. Character of Wound/Ulcer Post Debridement is improved. Severity of Tissue Post Debridement is: Fat layer exposed. Post procedure Diagnosis Wound #9: Same as Pre-Procedure Pre-procedure diagnosis of Wound #9 is a Diabetic Wound/Ulcer of the Lower Extremity located on the Right,Medial Foot . There was a Three Layer Compression Therapy Procedure by Zandra Abts, RN. Post procedure Diagnosis Wound #9: Same as Pre-Procedure Plan Follow-up Appointments: Return Appointment in 1 week. Dressing Change Frequency: Wound #9 Right,Medial Foot: Do not change entire dressing for one week. Wound Cleansing: Wound #9 Right,Medial Foot: May shower with protection. - use cast protector Primary Wound Dressing: Wound #9 Right,Medial  Foot: Polymem Silver Secondary Dressing: Wound #9 Right,Medial Foot: Foam - foam donut ABD pad Zetuvit or Kerramax Edema Control: 3 Layer Compression System - Right Lower Extremity Avoid standing for long periods of time Elevate legs to the level of the heart or above for 30 minutes daily and/or when sitting, a frequency of: - throughout the day Exercise regularly Off-Loading: Open toe surgical shoe to: - right foot 1. Still the same type of debridement this week but even more aggressive than last week 2. It is very clear that this is a weightbearing deformity in this patient's foot. Although she did not respond to a total contact cast last time we tried this and the fact that she did get good response for a while on this side with simple offloading. I find myself thinking about a total contact cast again. Simply too much callus and rolled edge around the wound surface Electronic Signature(s) Signed: 10/01/2019 5:12:45 PM By: Baltazar Najjar MD Entered By: Baltazar Najjar on 10/01/2019 07:36:02 -------------------------------------------------------------------------------- SuperBill Details Patient Name: Date of Service: [facility], [name] 09/30/2019 Medical Record Number: [id number] Patient Account Number: [id number] Date of Birth/Sex: Treating RN: 11/28/1955 (64 y.o. F) [name], Sarah Herter Primary Care Provider: MA SO UDI, [place] MA LSA DA T Other Clinician: Referring Provider: Treating Provider/Extender: Abe People, ELHA MA LSA DA T Weeks in Treatment: 35 Diagnosis Coding ICD-10 Codes Code Description E11.621 Type 2 diabetes mellitus with foot ulcer L97.511 Non-pressure chronic ulcer of other part of right foot limited to breakdown of skin E11.42 Type 2 diabetes mellitus with diabetic polyneuropathy M14.671 Charcot's joint, right ankle and foot M14.672 Charcot's joint, left ankle and foot 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, left ankle and foot Facility  Procedures CPT4 Code: 16109604 Description: 11042 - DEB SUBQ TISSUE 20 SQ CM/< ICD-10 Diagnosis Description L97.511 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

## 2019-10-07 ENCOUNTER — Encounter (HOSPITAL_BASED_OUTPATIENT_CLINIC_OR_DEPARTMENT_OTHER): Payer: Medicare Other | Admitting: Internal Medicine

## 2019-10-07 ENCOUNTER — Other Ambulatory Visit: Payer: Self-pay

## 2019-10-07 DIAGNOSIS — E11621 Type 2 diabetes mellitus with foot ulcer: Secondary | ICD-10-CM | POA: Diagnosis not present

## 2019-10-08 NOTE — Progress Notes (Signed)
[name], [name] ([id number]) Visit Report for 10/07/2019 Arrival Information Details Patient Name: Date of Service: [name], [name] 10/07/2019 1:45 PM Medical Record Number: [id number] Patient Account Number: [id number] Date of Birth/Sex: Treating RN: 1955/04/29 (64 y.o. [name] Primary Care Corwin Kuiken: MA SO UDI, [name] MA LSA DA T Other Clinician: Referring Glendoris Nodarse: Treating Saxton Chain/Extender: Baltazar Najjar MA SO UDI, ELHA MA LSA DA T Weeks in Treatment: 84 Visit Information History Since Last Visit Added or deleted any medications: No Patient Arrived: Cane Any new allergies or adverse reactions: No Arrival Time: 14:06 Had a fall or experienced change in No Accompanied By: self activities of daily living that may affect Transfer Assistance: None risk of falls: Patient Identification Verified: Yes Signs or symptoms of abuse/neglect since last visito No Secondary Verification Process Completed: Yes Hospitalized since last visit: No Patient Requires Transmission-Based Precautions: No Implantable device outside of the clinic excluding No Patient Has Alerts: No cellular tissue based products placed in the [name] since last visit: Has Dressing in Place as Prescribed: Yes Pain Present Now: No Electronic Signature(s) Signed: 10/08/2019 8:07:58 AM By: Karl Ito Entered By: Karl Ito on 10/07/2019 14:07:19 -------------------------------------------------------------------------------- Compression Therapy Details Patient Name: Date of Service: [name], [name] 10/07/2019 1:45 PM Medical Record Number: [id number] Patient Account Number: [id number] Date of Birth/Sex: Treating RN: [birth year] (64 y.o. [name] Primary Care Rehman Levinson: MA SO UDI, Judene Companion MA LSA DA T Other Clinician: Referring Lijah Bourque: Treating Lupita Rosales/Extender: Baltazar Najjar MA SO UDI, ELHA MA LSA DA T Weeks in Treatment: 36 Compression Therapy Performed for Wound Assessment:  Wound #9 Right,Medial Foot Performed By: Clinician Zandra Abts, RN Compression Type: Three Layer Post Procedure Diagnosis Same as Pre-procedure Electronic Signature(s) Signed: 10/07/2019 5:09:36 PM By: Zandra Abts RN, BSN Entered By: Zandra Abts on 10/07/2019 15:21:08 -------------------------------------------------------------------------------- Encounter Discharge Information Details Patient Name: Date of Service: [name], [name] 10/07/2019 1:45 PM Medical Record Number: [id number] Patient Account Number: [id number] Date of Birth/Sex: Treating RN: Feb 13, 1955 (64 y.o. F) [name] Primary Care Isabellah Sobocinski: MA SO UDI, [place] MA LSA DA T Other Clinician: Referring Quinnley Colasurdo: Treating Tramon Crescenzo/Extender: Baltazar Najjar MA SO UDI, ELHA MA LSA DA T Weeks in Treatment: 19 Encounter Discharge Information Items Post Procedure Vitals Discharge Condition: Stable Temperature (F): 98.5 Ambulatory Status: Crutches Pulse (bpm): 73 Discharge Destination: Home Respiratory Rate (breaths/min): 18 Transportation: Private Auto Blood Pressure (mmHg): 115/72 Accompanied By: self Schedule Follow-up Appointment: Yes Clinical Summary of Care: Patient Declined Electronic Signature(s) Signed: 10/07/2019 4:56:48 PM By: [name] Entered By: [name] on 10/07/2019 15:35:47 -------------------------------------------------------------------------------- Lower Extremity Assessment Details Patient Name: Date of Service: [name] [place] 10/07/2019 1:45 PM Medical Record Number: [id number] Patient Account Number: [id number] Date of Birth/Sex: Treating RN: [birth year] (64 y.o. [name] Primary Care Mika Griffitts: MA SO UDI, [place] MA LSA DA T Other Clinician: Referring Robie Mcniel: Treating Jahkai Yandell/Extender: Baltazar Najjar MA SO UDI, ELHA MA LSA DA T Weeks in Treatment: 36 Edema Assessment Assessed: [Left: No] [Right: Yes] Edema: [Left: N] [Right: o] Calf Left:  Right: Point of Measurement: 43 cm From Medial Instep cm 33 cm Ankle Left: Right: Point of Measurement: 12 cm From Medial Instep cm 21.5 cm Vascular Assessment Pulses: Dorsalis Pedis Palpable: [Right:Yes] Electronic Signature(s) Signed: 10/07/2019 5:10:33 PM By: Shawn Stall Entered By: Shawn Stall on 10/07/2019 14:24:30 -------------------------------------------------------------------------------- Multi Wound Chart Details Patient Name: Date of Service: [name], [name] 10/07/2019 1:45 PM Medical Record Number: [id number] Patient Account Number: [id number] Date of Birth/Sex: Treating  RN: 04-May-1955 (64 y.o. [name] Primary Care Daley Mooradian: MA SO UDI, [place] MA LSA DA T Other Clinician: Referring Elika Godar: Treating Ann Bohne/Extender: Baltazar Najjar MA SO UDI, ELHA MA LSA DA T Weeks in Treatment: 36 Vital Signs Height(in): 68 Pulse(bpm): 73 Weight(lbs): 265 Blood Pressure(mmHg): 115/72 Body Mass Index(BMI): 40 Temperature(F): 98.5 Respiratory Rate(breaths/min): 18 Photos: [9:No Photos Right, Medial Foot] [N/A:N/A N/A] Wound Location: [9:Blister] [N/A:N/A] Wounding Event: [9:Diabetic Wound/Ulcer of the Lower] [N/A:N/A] Primary Etiology: [9:Extremity Glaucoma, Chronic sinus] [N/A:N/A] Comorbid History: [9:problems/congestion, Anemia, Sleep Apnea, Hypertension, Type II Diabetes, Rheumatoid Arthritis, Neuropathy 06/18/2018] [N/A:N/A] Date Acquired: [9:36] [N/A:N/A] Weeks of Treatment: [9:Open] [N/A:N/A] Wound Status: [9:1x1.1x0.3] [N/A:N/A] Measurements L x W x D (cm) [9:0.864] [N/A:N/A] A (cm) : rea [9:0.259] [N/A:N/A] Volume (cm) : [9:92.10%] [N/A:N/A] % Reduction in A [9:rea: 97.40%] [N/A:N/A] % Reduction in Volume: [9:Grade 2] [N/A:N/A] Classification: [9:Medium] [N/A:N/A] Exudate A mount: [9:Serosanguineous] [N/A:N/A] Exudate Type: [9:red, brown] [N/A:N/A] Exudate Color: [9:Well defined, not attached] [N/A:N/A] Wound Margin: [9:Large (67-100%)]  [N/A:N/A] Granulation A mount: [9:Red, Pink] [N/A:N/A] Granulation Quality: [9:None Present (0%)] [N/A:N/A] Necrotic A mount: [9:Fat Layer (Subcutaneous Tissue): Yes N/A] Exposed Structures: [9:Fascia: No Tendon: No Muscle: No Joint: No Bone: No Small (1-33%)] [N/A:N/A] Epithelialization: [9:Debridement - Excisional] [N/A:N/A] Debridement: Pre-procedure Verification/Time Out 15:20 [N/A:N/A] Taken: [9:Callus, Subcutaneous] [N/A:N/A] Tissue Debrided: [9:Skin/Subcutaneous Tissue] [N/A:N/A] Level: [9:1.1] [N/A:N/A] Debridement A (sq cm): [9:rea Curette] [N/A:N/A] Instrument: [9:Minimum] [N/A:N/A] Bleeding: [9:Pressure] [N/A:N/A] Hemostasis A chieved: [9:0] [N/A:N/A] Procedural Pain: [9:0] [N/A:N/A] Post Procedural Pain: [9:Procedure was tolerated well] [N/A:N/A] Debridement Treatment Response: [9:1x1.1x0.3] [N/A:N/A] Post Debridement Measurements L x W x D (cm) [9:0.259] [N/A:N/A] Post Debridement Volume: (cm) [9:Compression Therapy] [N/A:N/A] Procedures Performed: [9:Debridement] Treatment Notes Wound #9 (Right, Medial Foot) 1. Cleanse With Wound Cleanser Soap and water 2. Periwound Care Moisturizing lotion 3. Primary Dressing Applied Polymem Ag 4. Secondary Dressing Dry Gauze Foam Kerramax/Xtrasorb 6. Support Layer Applied 3 layer compression wrap Notes foam donut as secondary, stockinette. Electronic Signature(s) Signed: 10/07/2019 5:09:36 PM By: Zandra Abts RN, BSN Signed: 10/08/2019 4:19:57 PM By: Baltazar Najjar MD Entered By: Baltazar Najjar on 10/07/2019 16:04:55 -------------------------------------------------------------------------------- Multi-Disciplinary Care Plan Details Patient Name: Date of Service: [name], [name] 10/07/2019 1:45 PM Medical Record Number: [id number] Patient Account Number: [id number] Date of Birth/Sex: Treating RN: 01-19-1955 (64 y.o. [name] Primary Care Cletis Muma: MA SO UDI, Judene Companion MA LSA DA T Other  Clinician: Referring Delvis Kau: Treating Analea Muller/Extender: Baltazar Najjar MA SO UDI, ELHA MA LSA DA T Weeks in Treatment: 55 Active Inactive Wound/Skin Impairment Nursing Diagnoses: Impaired tissue integrity Knowledge deficit related to ulceration/compromised skin integrity Goals: Patient/caregiver will verbalize understanding of skin care regimen Date Initiated: 01/28/2019 Target Resolution Date: 11/01/2019 Goal Status: Active Interventions: Assess patient/caregiver ability to obtain necessary supplies Assess patient/caregiver ability to perform ulcer/skin care regimen upon admission and as needed Assess ulceration(s) every visit Provide education on ulcer and skin care Notes: Electronic Signature(s) Signed: 10/07/2019 5:09:36 PM By: Zandra Abts RN, BSN Entered By: Zandra Abts on 10/07/2019 15:19:13 -------------------------------------------------------------------------------- Pain Assessment Details Patient Name: Date of Service: [name], [name] 10/07/2019 1:45 PM Medical Record Number: [id number] Patient Account Number: [id number] Date of Birth/Sex: Treating RN: [birth year] (64 y.o. [name] Primary Care Doniesha Landau: MA SO UDI, [place] MA LSA DA T Other Clinician: Referring Yasmene Salomone: Treating Layson Bertsch/Extender: Baltazar Najjar MA SO UDI, ELHA MA LSA DA T Weeks in Treatment: 11 Active Problems Location of Pain Severity and Description of Pain Patient Has Paino No Site Locations Pain Management and  Medication Current Pain Management: Electronic Signature(s) Signed: 10/07/2019 5:09:36 PM By: Zandra Abts RN, BSN Signed: 10/08/2019 8:07:58 AM By: Karl Ito Entered By: Karl Ito on 10/07/2019 14:07:46 -------------------------------------------------------------------------------- Patient/Caregiver Education Details Patient Name: Date of Service: [name] 9/20/2021andnbsp1:45 PM Medical Record Number: [id number] Patient Account  Number: [id number] Date of Birth/Gender: Treating RN: [birth year] (64 y.o. [name] Primary Care Physician: MA SO UDI, Judene Companion MA LSA DA T Other Clinician: Referring Physician: Treating Physician/Extender: Baltazar Najjar MA SO UDI, ELHA MA LSA DA T Weeks in Treatment: 41 Education Assessment Education Provided To: Patient Education Topics Provided Wound/Skin Impairment: Methods: Explain/Verbal Responses: State content correctly [employer]) Signed: 10/07/2019 5:09:36 PM By: Zandra Abts RN, BSN Entered By: Zandra Abts on 10/07/2019 15:19:30 -------------------------------------------------------------------------------- Wound Assessment Details Patient Name: Date of Service: [name], [name] 10/07/2019 1:45 PM Medical Record Number: [id number] Patient Account Number: [id number] Date of Birth/Sex: Treating RN: [birth year] (64 y.o. [name] Primary Care Teyton Pattillo: MA SO UDI, [place] MA LSA DA T Other Clinician: Referring Shanique Aslinger: Treating Jaliyah Fotheringham/Extender: Baltazar Najjar MA SO UDI, ELHA MA LSA DA T Weeks in Treatment: 36 Wound Status Wound Number: 9 Primary Diabetic Wound/Ulcer of the Lower Extremity Etiology: Wound Location: Right, Medial Foot Wound Open Wounding Event: Blister Status: Date Acquired: 06/18/2018 Comorbid Glaucoma, Chronic sinus problems/congestion, Anemia, Sleep Weeks Of Treatment: 36 History: Apnea, Hypertension, Type II Diabetes, Rheumatoid Arthritis, Clustered Wound: No Neuropathy Photos Photo Uploaded By: Benjaman Kindler on 10/08/2019 11:22:01 Wound Measurements Length: (cm) 1 Width: (cm) 1.1 Depth: (cm) 0.3 Area: (cm) 0.864 Volume: (cm) 0.259 % Reduction in Area: 92.1% % Reduction in Volume: 97.4% Epithelialization: Small (1-33%) Tunneling: No Undermining: No Wound Description Classification: Grade 2 Wound Margin: Well defined, not attached Exudate Amount: Medium Exudate Type: Serosanguineous Exudate  Color: red, brown Foul Odor After Cleansing: No Slough/Fibrino No Wound Bed Granulation Amount: Large (67-100%) Exposed Structure Granulation Quality: Red, Pink Fascia Exposed: No Necrotic Amount: None Present (0%) Fat Layer (Subcutaneous Tissue) Exposed: Yes Tendon Exposed: No Muscle Exposed: No Joint Exposed: No Bone Exposed: No Treatment Notes Wound #9 (Right, Medial Foot) 1. Cleanse With Wound Cleanser Soap and water 2. Periwound Care Moisturizing lotion 3. Primary Dressing Applied Polymem Ag 4. Secondary Dressing Dry Gauze Foam Kerramax/Xtrasorb 6. Support Layer Applied 3 layer compression wrap Notes foam donut as secondary, stockinette. Electronic Signature(s) Signed: 10/07/2019 5:09:36 PM By: Zandra Abts RN, BSN Signed: 10/07/2019 5:10:33 PM By: Shawn Stall Entered By: Shawn Stall on 10/07/2019 14:25:35 -------------------------------------------------------------------------------- Vitals Details Patient Name: Date of Service: Sarah CH, Ariah J. 10/07/2019 1:45 PM Medical Record Number: [id number] Patient Account Number: [id number] Date of Birth/Sex: Treating RN: November 12, 1955 (64 y.o. [name] Primary Care Luccas Towell: MA SO UDI, [place] MA LSA DA T Other Clinician: Referring Brennan Litzinger: Treating Tayana Shankle/Extender: Baltazar Najjar MA SO UDI, ELHA MA LSA DA T Weeks in Treatment: 61 Vital Signs Time Taken: 14:07 Temperature (F): 98.5 Height (in): 68 Pulse (bpm): 73 Weight (lbs): 265 Respiratory Rate (breaths/min): 18 Body Mass Index (BMI): 40.3 Blood Pressure (mmHg): 115/72 Reference Range: 80 - 120 mg / dl Electronic Signature(s) Signed: 10/08/2019 8:07:58 AM By: Karl Ito Entered By: Karl Ito on 10/07/2019 14:07:36

## 2019-10-08 NOTE — Progress Notes (Signed)
VOULA, WALN ([id number]) Visit Report for 10/07/2019 Debridement Details Patient Name: Date of Service: [name], [name] 10/07/2019 1:45 PM Medical Record Number: [id number] Patient Account Number: [id number] Date of Birth/Sex: Treating RN: 05-16-1955 (64 y.o. [name] Primary Care Provider: MA SO UDI, [place] MA LSA DA T Other Clinician: Referring Provider: Treating Provider/Extender: Baltazar Najjar MA SO UDI, ELHA MA LSA DA T Weeks in Treatment: 36 Debridement Performed for Assessment: Wound #9 Right,Medial Foot Performed By: Physician [name], MD Debridement Type: Debridement Severity of Tissue Pre Debridement: Fat layer exposed Level of Consciousness (Pre-procedure): Awake and Alert Pre-procedure Verification/Time Out Yes - 15:20 Taken: Start Time: 15:20 T Area Debrided (L x W): otal 1 (cm) x 1.1 (cm) = 1.1 (cm) Tissue and other material debrided: Viable, Non-Viable, Callus, Subcutaneous Level: Skin/Subcutaneous Tissue Debridement Description: Excisional Instrument: Curette Bleeding: Minimum Hemostasis Achieved: Pressure End Time: 15:21 Procedural Pain: 0 Post Procedural Pain: 0 Response to Treatment: Procedure was tolerated well Level of Consciousness (Post- Awake and Alert procedure): Post Debridement Measurements of Total Wound Length: (cm) 1 Width: (cm) 1.1 Depth: (cm) 0.3 Volume: (cm) 0.259 Character of Wound/Ulcer Post Debridement: Improved Severity of Tissue Post Debridement: Fat layer exposed Post Procedure Diagnosis Same as Pre-procedure Electronic Signature(s) Signed: 10/07/2019 5:09:36 PM By: Zandra Abts RN, BSN Signed: 10/08/2019 4:19:57 PM By: Baltazar Najjar MD Entered By: Baltazar Najjar on 10/07/2019 16:05:06 -------------------------------------------------------------------------------- HPI Details Patient Name: Date of Service: [name], [name] 10/07/2019 1:45 PM Medical Record Number: [id number] Patient Account  Number: [id number] Date of Birth/Sex: Treating RN: 1955/05/16 (64 y.o. [name] Primary Care Provider: MA SO UDI, [place] MA LSA DA T Other Clinician: Referring Provider: Treating Provider/Extender: Baltazar Najjar MA SO UDI, ELHA MA LSA DA T Weeks in Treatment: 51 History of Present Illness HPI Description: 64 yrs old bf here for follow up recurrent left charcot foot ulcer. She has DM. She is not a smoker. She states a blister developed 3 months ago at site of previous breakdown from 1 year ago. It was debrided at the podiatrist's office. She went to the ER and then sent here. She denies pain. Xray was negative for osteo. Culture from this clinic negative. 09/08/14 [address]erred by [name] to [name] for Charcot foot. Seen by him and MRI scheduled 09/19/14. Prior silver collagen but this was not ordered last visit, patient has been rinsing with saline alone. Re institute silver collagen every other day. F/u 2 weeks with [name] as Labor Day holiday. Supplies ordered. 09/25/14; this is a patient with a Charcot foot on the left. she has a wound over the plantar aspect of her left calcaneus. She has been seen by [name] of orthopedic surgery and has had an MRI. She is apparently going within the next week or 2 for corrective surgery which will involve an external fixator. I don't have any of the details of this. 10/06/14; The patient is a 64 yrs old bf with a left Charcot foot and ulcer on the plantar. 12/01/14 Returns post surgery with [name]. Has follow up visit later this week with him, currently in facility and NWB over this extremity. States no drainage from foot and no current wound care. On exam callus and scab in place, suspect healed. Has external fixator in place. Will defer to [name] for management, ok to place moisturizing cream over scabs and callus and she will call if she requires follow up here. READMISSION 01/28/2019 Patient was in the  clinic in 2016 for a wound  on the plantar aspect of her left calcaneus. Predominantly cared for by [name] she was referred to [name] and had corrective surgery for the position of her left ankle I believe. She had previously been here in 2015 and then prior to that in 2011 2012 that I do not have these records. More recently she has developed fairly extensive wounds on the right medial foot, left lateral foot and a small area on the right medial great toe. Right medial foot wound has been there for 6 to 7 months, left foot wound for 2 to 3 months and the right great toe only over the last few weeks. She has been followed by [name] at Dr. Laverta [place] office it sounds as though she has been undergoing callus paring and silver nitrate. The patient has been washing these off with soap and water and plan applying dry dressing Past medical history, type 2 diabetes well controlled with a recent hemoglobin A1c of 7 on 11/16, type 2 diabetes with peripheral neuropathy, previous left Charcot joint surgery bilateral Charcot joints currently, stage III chronic renal failure, hypertension, obstructive sleep apnea, history of MRSA and gastroesophageal reflux. 1/18; this is a patient with bilateral Charcot feet. Plain x-rays that I did of both of these wounds that are either at bone or precariously close to bone were done. On the right foot this showed possible lytic destruction involving the anterior aspect of the talus which may represent a wound osteomyelitis. An MRI was recommended. On the left there was no definite lytic destruction although the wound is deep undermining's and I think probably requires an MRI on this basis in any case 1/25; patient was supposed to have her MRI last Friday of her bilateral feet however they would not do it because there was silver alginate in her dressings, will replace with calcium alginate today and will see if we can get her done today 2/1; patient did not get her MRIs done last week  because of issues with the MRI department receiving orders to do bilateral feet. She has 2 deep wounds in the setting of Charcot feet deformities bilaterally. Both of them at one point had exposed bone although I had trouble demonstrating that today. I am not sure that we can offload these very aggressively as I watched her leave the clinic last week her gait is very narrow and unsteady. 2/15; the MRI of her right foot did not show osteomyelitis potentially osteonecrosis of the talus. 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 The wounds are a large wound on the right medial foot and on the left a substantial wound on the left lateral plantar calcaneus. We have been using silver alginate. Ultimately I think we need to consider a total contact cast on the right while we work through the possibility of osteomyelitis in the left. I watched her walk out with her crutches I have some trepidation about the ability to walk with the cast on her right foot. Nevertheless with her Charcot deformities I do not think there is a way to heal these short of offloading them aggressively. 2/22; the culture of the bone in the left foot that I did last week showed a few Citrobacter Koseri as well as some streptococcal species. In my attempted bone for pathology did not actually yield a lot of bone the pathologist did not identify any osteomyelitis. Nevertheless based on the MRI, bone for culture  and the probing wound into the bone itself I think this woman has osteomyelitis in the left foot and we will refer her to infectious disease. Is promised today and aggressive debridement of the right foot and a total contact cast which surprisingly she seemed to tolerate quite well 03/13/2019 upon evaluation today patient appears to be doing well with her total contact cast she is here for the first cast change. She had no areas of rubbing or any other complications at this point.  Overall things are doing quite well. 2/26; the patient was kindly seen by Dr. Orvan Falconer of infectious disease on 2/25. She is now on IV vancomycin PICC line placement in 2 days and oral Flagyl. This was in response to her MRI showing osteomyelitis of the left calcaneus concerning for osteomyelitis. We have been putting her right leg in a total contact cast. Our nurses report drainage. She is tolerating the total contact cast well. 3/4; the patient is started her IV antibiotics and is taking IV vancomycin and oral Flagyl. She appears to be tolerating these both well. This is for osteomyelitis involving the left calcaneus. The area on the right in the setting of bilateral Charcot deformities did not have any bone infection on MRI. We put a total contact cast on her with silver alginate as the primary dressing and she returns today in follow-up. Per our intake nurse there was too much drainage to leave this on all week therefore we will bring her back for an early change 3/8; follow-up total contact cast she is still having a lot of drainage probably too much drainage from the right foot wound will week with the same cast. She will be back on Thursday. The area on the left looks somewhat better 3/11; patient here for a total contact cast change. 3/15; patient came in for wound care evaluation. She is still on IV vancomycin and oral Flagyl for osteomyelitis in the left foot. The area on the right foot we are putting in a total contact cast. 3/18 for total contact cast change on the right foot 3/22; the patient was here for wound review today. The area on the right foot is slightly smaller in terms of surface area. However the surface of the wound is very gritty and hyper granulated. More problematically the area on the plantar left foot has increased depth indeed in the [name] of this it probes to bone. We have been using Hydrofera Blue in both areas. She is on antibiotics as prescribed by infectious  disease IV vancomycin and oral Flagyl 3/25; the patient is in for total contact cast change. Our intake nurse was concerned about the amount of drainage which soaked right to the multiple layers we are putting on this. Wondered about options. The wound bed actually looks as healthy as this is looked although there may not be a lot of change in dimensions things are looking a lot better. If we are going to heal this woman's wound which is in the middle of her right Charcot foot this is going to need to be offloaded. There are not many alternatives 3/29; still not a lot of improvement although the surface of the right wound looks better. We have been using Hydrofera Blue 04/17/2019 upon evaluation today patient appears to be doing well with a total contact cast. With actually having to change this twice a week. She saw [name] for wound care earlier in the week this is for the cast change today. That is due  to the fact that she has significant drainage. Overall the wound is been looking excellent however. 4/5; we continue to put a total contact cast on this patient with Hydrofera Blue. Still a lot of drainage which precludes a simple weekly change or changing this twice a week. 4/8; total contact cast changed today. Apparently the wound looks better per our intake nurse 4/12; patient here for wound care evaluation. Wounds on the right foot have not changed in dimension none about a month left foot looks similar. Apparently her antibiotics that we are giving her for osteomyelitis in the left foot complete on Wednesday. We have been using Hydrofera Blue on the right foot under a total contact cast. Nurses report greenish drainage although I think this may be discoloration from the [facility] 4/15; back for cast change wounds look quite good although still moderate amount of drainage. T contact cast reapplied otal 4/19;. Wound evaluation. Wound on the right foot is smaller surface looks healthy.  There is still the divot in the middle part of this wound but I could not feel any bone. Area on the left foot also looks better She has completed her antibiotics but still has the PICC line in. 4/23; patient comes back for a total contact cast change this was done in the standard fashion no issues 4/26; wound measures slightly larger on the right foot which is disappointing. She sees Dr. Orvan Falconer with regards to her antibiotics on Wednesday. I believe she is on vancomycin IV and oral Flagyl. 4/29; in for her routine total contact cast change. She saw Dr. Orvan Falconer this morning. He is asking about antibiotic continuation I will be in touch with him. I did not look at her wound today 5/3; patient saw Dr. Orvan Falconer on 4/28. I did send him a note asking about the continuation of perhaps an oral regimen. I have not heard back. She still has an elevated sedimentation rate at 81. Her wounds are smaller. 5/6; in for her total contact cast change. We did not look at the wound today. She has seen Dr. Orvan Falconer and is now on oral Keflex and Flagyl. She seems to be tolerating these well 5/10; she had her PICC line removed. The wound on the right foot after some initial improvement has not made any improvement even with the total contact cast. Still having a lot of drainage. Likewise the area on the left foot really is not any better either 5/14; in for a cast change today. We will look at the right foot on Monday. If this is not making any progress I am going to try to cast the left foot. 5/17; the wound on the right foot is absolutely no different. I am going to put ointment on this area and change the cast of the left foot. We have been using silver alginate there 5/21; apparently the area on the right foot was better per our intake nurse although I did not see it. We did a standard total contact cast change on the left foot we have been applying silver alginate to the wound here. 06/10/19-Area on the right  foot per dimensions slightly better, the left foot has still some depth applying silver alginate to the wound with TCC 06/13/19-Patient here for Casting to Left foot 6/1; patient had the total contact cast were placed on the left foot. Noted some swelling in her right leg although the dimensions of the wound on the right foot are better. We have been using PolyMem on the  right and silver alginate on the left under the cast 6/7; we continue to make nice progress in both her plantar feet wound. We are putting the total contact cast on the left. Surprisingly in spite of this the area on the right medial foot in the middle of her Charcot deformities actually is making progress as well 07/01/2019 upon evaluation today patient actually appears to be doing quite well with regard to her wounds on the feet compared to when I saw her last that has been quite some time. With that being said I do believe the total contact cast has been beneficial in this left foot and she has a lot of signs of improvement she does have some callus overhanging which is caused a little bit of a ledge and an issue here. I do believe that we may be able to do something to help in that regard. Fortunately otherwise the wound seems to be progressing quite nicely. 6/21; the patient's area on the left foot that we are currently casting is just about closed. The area on the right medial foot has a healthy looking surface but may be not changed as much from last week in terms of surface area. We are using silver alginate on the right and polymen Ag under the cast 6/28; only a small open area on the left foot remains. Using silver alginate under a total contact cast. She has a diabetic shoe I have asked her to bring that for next week where this may be closed. The area on the right foot with a Charcot deformity is also measuring slightly smaller. We are using PolyMem Ag here 7/6; left foot is totally closed. She does not require a total  contact cast. She will graduate to her own diabetic shoe. The area on the right foot with a Charcot deformity measuring about the same. We have been using polymen under compression wraps. She did not do well in this area with a total contact cast 7/13; left foot remains intact. She is skillfully covering this area with foam and using her diabetic shoe. Slight area improvement of the right foot wound. Still rolled senescent looking edges. We have been using polymen 7/19; left foot remains intact. Wound measuring slightly smaller on the right foot in the middle of the Charcot deformity. We have been using PolyMem Ag 7/26; per the patient left foot remains intact. The wound on the right foot looks slightly smaller but dimensions are measuring the same. We have been using polymen Ag 8/9-Patient back after 2 weeks, wound of the right foot again looking slightly smaller, continuing to use PolyMem silver 8/16; patient has made some minor improvements in wound area on the right foot. Using polymen Ag. The left foot remains healed 8/23; continued slight improvement in dimensions. Rolled edges around the wound edge noted. We have been using polymen Ag. Patient offloading with a surgical shoe and crutches 8/30; no change in surface area. Thick rolled skin and subcutaneous tissue around the edges debris on the wound surface. We have been using polymen Ag initially with some improvement however stalled on the last 2 weeks in terms of surface area 9/13; surface area is smaller still using polymen Ag. Thick rolled skin and subcutaneous tissue around the edges requiring debridement. 9/20; again surface area is improved using polymen Ag however each week she develops thick rolled skin and subcutaneous tissue around the wound edges requiring debridement. She is offloading this using crutches Electronic Signature(s) Signed: 10/08/2019 4:19:57 PM By: Leanord Hawking,  Casimiro Needle MD Entered By: Baltazar Najjar on 10/07/2019  16:05:39 -------------------------------------------------------------------------------- Physical Exam Details Patient Name: Date of Service: [name], [name] 10/07/2019 1:45 PM Medical Record Number: [id number] Patient Account Number: [id number] Date of Birth/Sex: Treating RN: 09-03-1955 (64 y.o. [name] Primary Care Provider: MA SO UDI, [place] MA LSA DA T Other Clinician: Referring Provider: Treating Provider/Extender: Baltazar Najjar MA SO UDI, ELHA MA LSA DA T Weeks in Treatment: 53 Constitutional Sitting or standing Blood Pressure is within target range for patient.. Pulse regular and within target range for patient.Marland Kitchen Respirations regular, non-labored and within target range.. Temperature is normal and within the target range for the patient.Marland Kitchen Appears in no distress. Notes Wound exam. Right foot medial plantar aspect. Using a #5 curette again I have knocked down callus and skin from around the margins to get a clean edge for epithelialization also debris over the wound surface. Hemostasis with direct pressure Electronic Signature(s) Signed: 10/08/2019 4:19:57 PM By: Baltazar Najjar MD Entered By: Baltazar Najjar on 10/07/2019 16:06:24 -------------------------------------------------------------------------------- Physician Orders Details Patient Name: Date of Service: [name], [name] 10/07/2019 1:45 PM Medical Record Number: [id number] Patient Account Number: [id number] Date of Birth/Sex: Treating RN: [birth year] (64 y.o. 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 Dressing Change Frequency Wound #9 Right,Medial Foot Do not change entire dressing for one week. Wound Cleansing Wound #9 Right,Medial Foot May shower with protection. - use cast protector Primary Wound Dressing Wound #9 Right,Medial Foot Polymem Silver Secondary Dressing Wound #9 Right,Medial Foot Foam - foam donut ABD pad Zetuvit or Kerramax Edema Control 3 Layer Compression System - Right Lower Extremity Avoid standing for long periods of time Elevate legs to the level of the heart or above for 30 minutes daily and/or when sitting, a frequency of: - throughout the day Exercise regularly Off-Loading Open toe surgical shoe to: - right foot Electronic Signature(s) Signed: 10/07/2019 5:09:36 PM By: Zandra Abts RN, BSN Signed: 10/08/2019 4:19:57 PM By: Baltazar Najjar MD Entered By: Zandra Abts on 10/07/2019 15:18:58 -------------------------------------------------------------------------------- Problem List Details Patient Name: Date of Service: [name], [name] 10/07/2019 1:45 PM Medical Record Number: [id number] Patient Account Number: [id number] Date of Birth/Sex: Treating RN: 1955/02/11 (64 y.o. [name] Primary Care Provider: MA SO UDI, [place] MA LSA DA T Other Clinician: Referring Provider: Treating Provider/Extender: Abe People, ELHA MA LSA DA T Weeks in Treatment: 91 Active Problems ICD-10 Encounter Code Description Active Date MDM Diagnosis E11.621 Type 2 diabetes mellitus with foot ulcer 01/28/2019 No Yes L97.511 Non-pressure chronic ulcer of other part of right foot limited to breakdown of 01/28/2019 No Yes skin E11.42 Type 2 diabetes mellitus with diabetic polyneuropathy 01/28/2019 No Yes M14.671  Charcot's joint, right ankle and foot 01/28/2019 No Yes M14.672 Charcot's joint, left ankle and foot 01/28/2019 No Yes 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, left ankle and foot 04/08/2019 No Yes Inactive Problems ICD-10 Code Description Active Date  Inactive Date L97.524 Non-pressure chronic ulcer of other part of left foot with necrosis of bone 01/28/2019 01/28/2019 L97.514 Non-pressure chronic ulcer of other part of right foot with necrosis of bone 01/28/2019 01/28/2019 Resolved Problems Electronic Signature(s) Signed: 10/08/2019 4:19:57 PM By: Baltazar Najjar MD Entered By: Baltazar Najjar on 10/07/2019 16:04:47 -------------------------------------------------------------------------------- Progress Note Details Patient Name: Date of Service: [name], [name] 10/07/2019 1:45 PM Medical Record Number: [id number] Patient Account Number: [id number] Date of Birth/Sex: Treating RN: [birth year] (64 y.o. [name] Primary Care Provider: MA SO UDI, [place] MA LSA DA T Other Clinician: Referring Provider: Treating Provider/Extender: Baltazar Najjar MA SO UDI, ELHA MA LSA DA T Weeks in Treatment: 36 Subjective History of Present Illness (HPI) 64 yrs old bf here for follow up recurrent left charcot foot ulcer. She has DM. She is not a smoker. She states a blister developed 3 months ago at site of previous breakdown from 1 year ago. It was debrided at the podiatrist's office. She went to the ER and then sent here. She denies pain. Xray was negative for osteo. Culture from this clinic negative. 09/08/14 Referred by [name] to [name] for Charcot foot. Seen by him and MRI scheduled 09/19/14. Prior silver collagen but this was not ordered last visit, patient has been rinsing with saline alone. Re institute silver collagen every other day. F/u 2 weeks with [name] as Labor Day holiday. Supplies ordered. 09/25/14; this is a patient with a Charcot foot on the left. she has a wound over the plantar  aspect of her left calcaneus. She has been seen by [name] of orthopedic surgery and has had an MRI. She is apparently going within the next week or 2 for corrective surgery which will involve an external fixator. I don't have any of the details of this. 10/06/14; The patient is a 64 yrs old bf with a left Charcot foot and ulcer on the plantar. 12/01/14 Returns post surgery with [name]. Has follow up visit later this week with him, currently in facility and NWB over this extremity. States no drainage from foot and no current wound care. On exam callus and scab in place, suspect healed. Has external fixator in place. Will defer to [name] for management, ok to place moisturizing cream over scabs and callus and she will call if she requires follow up here. READMISSION 01/28/2019 Patient was in the clinic in 2016 for a wound on the plantar aspect of her left calcaneus. Predominantly cared for by [name] she was referred to [name] and had corrective surgery for the position of her left ankle I believe. She had previously been here in 2015 and then prior to that in 2011 2012 that I do not have these records. More recently she has developed fairly extensive wounds on the right medial foot, left lateral foot and a small area on the right medial great toe. Right medial foot wound has been there for 6 to 7 months, left foot wound for 2 to 3 months and the right great toe only over the last few weeks. She has been followed by [name] at Dr. Laverta [place] office it sounds as though she has been undergoing callus paring and silver nitrate. The patient has been washing these off with soap and water and plan applying dry dressing Past medical history, type 2 diabetes well controlled with a recent hemoglobin A1c of 7 on 11/16, type 2 diabetes with peripheral neuropathy, previous left Charcot joint surgery bilateral  Charcot joints currently, stage III chronic renal failure, hypertension,  obstructive sleep apnea, history of MRSA and gastroesophageal reflux. 1/18; this is a patient with bilateral Charcot feet. Plain x-rays that I did of both of these wounds that are either at bone or precariously close to bone were done. On the right foot this showed possible lytic destruction involving the anterior aspect of the talus which may represent a wound osteomyelitis. An MRI was recommended. On the left there was no definite lytic destruction although the wound is deep undermining's and I think probably requires an MRI on this basis in any case 1/25; patient was supposed to have her MRI last Friday of her bilateral feet however they would not do it because there was silver alginate in her dressings, will replace with calcium alginate today and will see if we can get her done today 2/1; patient did not get her MRIs done last week because of issues with the MRI department receiving orders to do bilateral feet. She has 2 deep wounds in the setting of Charcot feet deformities bilaterally. Both of them at one point had exposed bone although I had trouble demonstrating that today. I am not sure that we can offload these very aggressively as I watched her leave the clinic last week her gait is very narrow and unsteady. 2/15; the MRI of her right foot did not show osteomyelitis potentially osteonecrosis of the talus. 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 The wounds are a large wound on the right medial foot and on the left a substantial wound on the left lateral plantar calcaneus. We have been using silver alginate. Ultimately I think we need to consider a total contact cast on the right while we work through the possibility of osteomyelitis in the left. I watched her walk out with her crutches I have some trepidation about the ability to walk with the cast on her right foot. Nevertheless with her Charcot deformities I do not think there is a way  to heal these short of offloading them aggressively. 2/22; the culture of the bone in the left foot that I did last week showed a few Citrobacter Koseri as well as some streptococcal species. In my attempted bone for pathology did not actually yield a lot of bone the pathologist did not identify any osteomyelitis. 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 Is promised today and aggressive debridement of the right foot and a total contact cast which surprisingly she seemed to tolerate quite well 03/13/2019 upon evaluation today patient appears to be doing well with her total contact cast she is here for the first cast change. She had no areas of rubbing or any other complications at this point. Overall things are doing quite well. 2/26; the patient was kindly seen by Dr. Orvan Falconer of infectious disease on 2/25. She is now on IV vancomycin PICC line placement in 2 days and oral Flagyl. This was in response to her MRI showing osteomyelitis of the left calcaneus concerning for osteomyelitis. We have been putting her right leg in a total contact cast. Our nurses report drainage. She is tolerating the total contact cast well. 3/4; the patient is started her IV antibiotics and is taking IV vancomycin and oral Flagyl. She appears to be tolerating these both well. This is for  osteomyelitis involving the left calcaneus. The area on the right in the setting of bilateral Charcot deformities did not have any bone infection on MRI. We put a total contact cast on her with silver alginate as the primary dressing and she returns today in follow-up. Per our intake nurse there was too much drainage to leave this on all week therefore we will bring her back for an early change 3/8; follow-up total contact cast she is still having a lot of drainage probably too much drainage from  the right foot wound will week with the same cast. She will be back on Thursday. The area on the left looks somewhat better 3/11; patient here for a total contact cast change. 3/15; patient came in for wound care evaluation. She is still on IV vancomycin and oral Flagyl for osteomyelitis in the left foot. The area on the right foot we are putting in a total contact cast. 3/18 for total contact cast change on the right foot 3/22; the patient was here for wound review today. The area on the right foot is slightly smaller in terms of surface area. However the surface of the wound is very gritty and hyper granulated. More problematically the area on the plantar left foot has increased depth indeed in the [name] of this it probes to bone. We have been using Hydrofera Blue in both areas. She is on antibiotics as prescribed by infectious disease IV vancomycin and oral Flagyl 3/25; the patient is in for total contact cast change. Our intake nurse was concerned about the amount of drainage which soaked right to the multiple layers we are putting on this. Wondered about options. The wound bed actually looks as healthy as this is looked although there may not be a lot of change in dimensions things are looking a lot better. If we are going to heal this woman's wound which is in the middle of her right Charcot foot this is going to need to be offloaded. There are not many alternatives 3/29; still not a lot of improvement although the surface of the right wound looks better. We have been using Hydrofera Blue 04/17/2019 upon evaluation today patient appears to be doing well with a total contact cast. With actually having to change this twice a week. She saw [name] for wound care earlier in the week this is for the cast change today. That is due to the fact that she has significant drainage. Overall the wound is been looking excellent however. 4/5; we continue to put a total contact cast on this patient with  Hydrofera Blue. Still a lot of drainage which precludes a simple weekly change or changing this twice a week. 4/8; total contact cast changed today. Apparently the wound looks better per our intake nurse 4/12; patient here for wound care evaluation. Wounds on the right foot have not changed in dimension none about a month left foot looks similar. Apparently her antibiotics that we are giving her for osteomyelitis in the left foot complete on Wednesday. We have been using Hydrofera Blue on the right foot under a total contact cast. Nurses report greenish drainage although I think this may be discoloration from the [name] 4/15; back for cast change wounds look quite good although still moderate amount of drainage. T contact cast reapplied otal 4/19;. Wound evaluation. Wound on the right foot is smaller surface looks healthy. There is still the divot in the middle part of this wound but I could not feel any bone.  Area on the left foot also looks better She has completed her antibiotics but still has the PICC line in. 4/23; patient comes back for a total contact cast change this was done in the standard fashion no issues 4/26; wound measures slightly larger on the right foot which is disappointing. She sees Dr. Orvan Falconer with regards to her antibiotics on Wednesday. I believe she is on vancomycin IV and oral Flagyl. 4/29; in for her routine total contact cast change. She saw Dr. Orvan Falconer this morning. He is asking about antibiotic continuation I will be in touch with him. I did not look at her wound today 5/3; patient saw Dr. Orvan Falconer on 4/28. I did send him a note asking about the continuation of perhaps an oral regimen. I have not heard back. She still has an elevated sedimentation rate at 81. Her wounds are smaller. 5/6; in for her total contact cast change. We did not look at the wound today. She has seen Dr. Orvan Falconer and is now on oral Keflex and Flagyl. She seems to be tolerating these  well 5/10; she had her PICC line removed. The wound on the right foot after some initial improvement has not made any improvement even with the total contact cast. Still having a lot of drainage. Likewise the area on the left foot really is not any better either 5/14; in for a cast change today. We will look at the right foot on Monday. If this is not making any progress I am going to try to cast the left foot. 5/17; the wound on the right foot is absolutely no different. I am going to put ointment on this area and change the cast of the left foot. We have been using silver alginate there 5/21; apparently the area on the right foot was better per our intake nurse although I did not see it. We did a standard total contact cast change on the left foot we have been applying silver alginate to the wound here. 06/10/19-Area on the right foot per dimensions slightly better, the left foot has still some depth applying silver alginate to the wound with TCC 06/13/19-Patient here for Casting to Left foot 6/1; patient had the total contact cast were placed on the left foot. Noted some swelling in her right leg although the dimensions of the wound on the right foot are better. We have been using PolyMem on the right and silver alginate on the left under the cast 6/7; we continue to make nice progress in both her plantar feet wound. We are putting the total contact cast on the left. Surprisingly in spite of this the area on the right medial foot in the middle of her Charcot deformities actually is making progress as well 07/01/2019 upon evaluation today patient actually appears to be doing quite well with regard to her wounds on the feet compared to when I saw her last that has been quite some time. With that being said I do believe the total contact cast has been beneficial in this left foot and she has a lot of signs of improvement she does have some callus overhanging which is caused a little bit of a ledge and  an issue here. I do believe that we may be able to do something to help in that regard. Fortunately otherwise the wound seems to be progressing quite nicely. 6/21; the patient's area on the left foot that we are currently casting is just about closed. The area on the right medial foot  has a healthy looking surface but may be not changed as much from last week in terms of surface area. We are using silver alginate on the right and polymen Ag under the cast 6/28; only a small open area on the left foot remains. Using silver alginate under a total contact cast. She has a diabetic shoe I have asked her to bring that for next week where this may be closed. The area on the right foot with a Charcot deformity is also measuring slightly smaller. We are using PolyMem Ag here 7/6; left foot is totally closed. She does not require a total contact cast. She will graduate to her own diabetic shoe. ooThe area on the right foot with a Charcot deformity measuring about the same. We have been using polymen under compression wraps. She did not do well in this area with a total contact cast 7/13; left foot remains intact. She is skillfully covering this area with foam and using her diabetic shoe. Slight area improvement of the right foot wound. Still rolled senescent looking edges. We have been using polymen 7/19; left foot remains intact. Wound measuring slightly smaller on the right foot in the middle of the Charcot deformity. We have been using PolyMem Ag 7/26; per the patient left foot remains intact. The wound on the right foot looks slightly smaller but dimensions are measuring the same. We have been using polymen Ag 8/9-Patient back after 2 weeks, wound of the right foot again looking slightly smaller, continuing to use PolyMem silver 8/16; patient has made some minor improvements in wound area on the right foot. Using polymen Ag. The left foot remains healed 8/23; continued slight improvement in dimensions.  Rolled edges around the wound edge noted. We have been using polymen Ag. Patient offloading with a surgical shoe and crutches 8/30; no change in surface area. Thick rolled skin and subcutaneous tissue around the edges debris on the wound surface. We have been using polymen Ag initially with some improvement however stalled on the last 2 weeks in terms of surface area 9/13; surface area is smaller still using polymen Ag. Thick rolled skin and subcutaneous tissue around the edges requiring debridement. 9/20; again surface area is improved using polymen Ag however each week she develops thick rolled skin and subcutaneous tissue around the wound edges requiring debridement. She is offloading this using crutches Objective Constitutional Sitting or standing Blood Pressure is within target range for patient.. Pulse regular and within target range for patient.Marland Kitchen Respirations regular, non-labored and within target range.. Temperature is normal and within the target range for the patient.Marland Kitchen Appears in no distress. Vitals Time Taken: 2:07 PM, Height: 68 in, Weight: 265 lbs, BMI: 40.3, Temperature: 98.5 F, Pulse: 73 bpm, Respiratory Rate: 18 breaths/min, Blood Pressure: 115/72 mmHg. General Notes: Wound exam. Right foot medial plantar aspect. Using a #5 curette again I have knocked down callus and skin from around the margins to get a clean edge for epithelialization also debris over the wound surface. Hemostasis with direct pressure Integumentary (Hair, Skin) Wound #9 status is Open. Original cause of wound was Blister. The wound is located on the Right,Medial Foot. The wound measures 1cm length x 1.1cm width x 0.3cm depth; 0.864cm^2 area and 0.259cm^3 volume. There is Fat Layer (Subcutaneous Tissue) exposed. There is no tunneling or undermining noted. There is a medium amount of serosanguineous drainage noted. The wound margin is well defined and not attached to the wound base. There is large (67-100%)  red, pink granulation within  the wound bed. There is no necrotic tissue within the wound bed. 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 There was a Excisional Skin/Subcutaneous Tissue Debridement with a total area of 1.1 sq cm performed by [name], MD. With the following instrument(s): Curette to remove Viable and Non-Viable tissue/material. Material removed includes Callus and Subcutaneous Tissue and. No specimens were taken. A time out was conducted at 15:20, prior to the start of the procedure. A Minimum amount of bleeding was controlled with Pressure. The procedure was tolerated well with a pain level of 0 throughout and a pain level of 0 following the procedure. Post Debridement Measurements: 1cm length x 1.1cm width x 0.3cm depth; 0.259cm^3 volume. Character of Wound/Ulcer Post Debridement is improved. Severity of Tissue Post Debridement is: Fat layer exposed. Post procedure Diagnosis Wound #9: Same as Pre-Procedure Pre-procedure diagnosis of Wound #9 is a Diabetic Wound/Ulcer of the Lower Extremity located on the Right,Medial Foot . There was a Three Layer Compression Therapy Procedure by Zandra Abts, RN. Post procedure Diagnosis Wound #9: Same as Pre-Procedure Plan Follow-up Appointments: Return Appointment in 1 week. Dressing Change Frequency: Wound #9 Right,Medial Foot: Do not change entire dressing for one week. Wound Cleansing: Wound #9 Right,Medial Foot: May shower with  protection. - use cast protector Primary Wound Dressing: Wound #9 Right,Medial Foot: Polymem Silver Secondary Dressing: Wound #9 Right,Medial Foot: Foam - foam donut ABD pad Zetuvit or Kerramax Edema Control: 3 Layer Compression System - Right Lower Extremity Avoid standing for long periods of time Elevate legs to the level of the heart or above for 30 minutes daily and/or when sitting, a frequency of: - throughout the day Exercise regularly Off-Loading: Open toe surgical shoe to: - right foot #1 I am continuing with polymen Ag. I asked her to be vigilant about pressure 2. For now I put off the total contact cast thought at least for another couple of weeks as the wound is improving in terms of surface area Electronic Signature(s) Signed: 10/08/2019 4:19:57 PM By: Baltazar Najjar MD Entered By: Baltazar Najjar on 10/07/2019 16:07:45 -------------------------------------------------------------------------------- SuperBill Details Patient Name: Date of Service: [name], [name] 10/07/2019 Medical Record Number: [id number] Patient Account Number: [id number] Date of Birth/Sex: Treating RN: 1955/06/15 (64 y.o. [name] Primary Care Provider: MA SO UDI, [place] MA LSA DA T Other Clinician: Referring Provider: Treating Provider/Extender: Abe People, ELHA MA LSA DA T Weeks in Treatment: 36 Diagnosis Coding ICD-10 Codes Code Description E11.621 Type 2 diabetes mellitus with foot ulcer L97.511 Non-pressure chronic ulcer of other part of right foot limited to breakdown of skin E11.42 Type 2 diabetes mellitus with diabetic polyneuropathy M14.671 Charcot's joint, right ankle and foot M14.672 Charcot's joint, left ankle and foot 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, left ankle and foot Facility Procedures CPT4 Code: [id number] Description: 11042 - DEB SUBQ TISSUE 20 SQ CM/< ICD-10 Diagnosis Description L97.511 Non-pressure chronic ulcer of other part of right foot  limited to breakdown of E11.621 Type 2 diabetes mellitus with foot ulcer Modifier: skin Quantity: 1 Physician Procedures :  CPT4 Code Description Modifier [id number] 11042 - WC PHYS SUBQ TISS 20 SQ CM ICD-10 Diagnosis Description L97.511 Non-pressure chronic ulcer of other part of right foot limited to breakdown of skin E11.621 Type 2 diabetes mellitus with foot ulcer Quantity: 1 Electronic Signature(s) Signed: 10/08/2019 4:19:57 PM By: Baltazar Najjar MD Entered By: Baltazar Najjar on 10/07/2019 16:08:05

## 2019-10-14 ENCOUNTER — Other Ambulatory Visit: Payer: Self-pay

## 2019-10-14 ENCOUNTER — Encounter (HOSPITAL_BASED_OUTPATIENT_CLINIC_OR_DEPARTMENT_OTHER): Payer: Medicare Other | Admitting: Internal Medicine

## 2019-10-14 DIAGNOSIS — E11621 Type 2 diabetes mellitus with foot ulcer: Secondary | ICD-10-CM | POA: Diagnosis not present

## 2019-10-15 NOTE — Progress Notes (Signed)
[name], [name] ([id number]) Visit Report for 10/14/2019 Debridement Details Patient Name: Date of Service: [name], [name] 10/14/2019 1:45 PM Medical Record Number: [id number] Patient Account Number: [id number] Date of Birth/Sex: Treating RN: Nov 04, 1955 (64 y.o. [name] Primary Care Provider: MA SO UDI, [place] MA LSA DA T Other Clinician: Referring Provider: Treating Provider/Extender: Baltazar Najjar MA SO UDI, ELHA MA LSA DA T Weeks in Treatment: 37 Debridement Performed for Assessment: Wound #9 Right,Medial Foot Performed By: Physician [name], MD Debridement Type: Debridement Severity of Tissue Pre Debridement: Fat layer exposed Level of Consciousness (Pre-procedure): Awake and Alert Pre-procedure Verification/Time Out Yes - 15:02 Taken: Start Time: 15:02 T Area Debrided (L x W): otal 1.1 (cm) x 1.2 (cm) = 1.32 (cm) Tissue and other material debrided: Viable, Non-Viable, Callus, Subcutaneous Level: Skin/Subcutaneous Tissue Debridement Description: Excisional Instrument: Curette Bleeding: Moderate Hemostasis Achieved: Silver Nitrate End Time: 15:03 Procedural Pain: 0 Post Procedural Pain: 0 Response to Treatment: Procedure was tolerated well Level of Consciousness (Post- Awake and Alert procedure): Post Debridement Measurements of Total Wound Length: (cm) 1.1 Width: (cm) 1.2 Depth: (cm) 0.3 Volume: (cm) 0.311 Character of Wound/Ulcer Post Debridement: Improved Severity of Tissue Post Debridement: Fat layer exposed Post Procedure Diagnosis Same as Pre-procedure Electronic Signature(s) Signed: 10/15/2019 7:37:15 AM By: Baltazar Najjar MD Signed: 10/15/2019 11:42:07 AM By: Zandra Abts RN, BSN Previous Signature: 10/14/2019 5:31:38 PM Version By: Zandra Abts RN, BSN Entered By: Baltazar Najjar on 10/14/2019 18:55:36 -------------------------------------------------------------------------------- HPI Details Patient Name: Date of  Service: [name], [name] 10/14/2019 1:45 PM Medical Record Number: [id number] Patient Account Number: [id number] Date of Birth/Sex: Treating RN: [birth year] (64 y.o. [name] Primary Care Provider: MA SO UDI, [place] MA LSA DA T Other Clinician: Referring Provider: Treating Provider/Extender: Baltazar Najjar MA SO UDI, ELHA MA LSA DA T Weeks in Treatment: 37 History of Present Illness HPI Description: 64 yrs old bf here for follow up recurrent left charcot foot ulcer. She has DM. She is not a smoker. She states a blister developed 3 months ago at site of previous breakdown from 1 year ago. It was debrided at the podiatrist's office. She went to the ER and then sent here. She denies pain. Xray was negative for osteo. Culture from this clinic negative. 09/08/14 [name] to [name] for Charcot foot. Seen by him and MRI scheduled 09/19/14. Prior silver collagen but this was not ordered last visit, patient has been rinsing with saline alone. Re institute silver collagen every other day. F/u 2 weeks with [name] as Labor Day holiday. Supplies ordered. 09/25/14; this is a patient with a Charcot foot on the left. she has a wound over the plantar aspect of her left calcaneus. She has been seen by [name] of orthopedic surgery and has had an MRI. She is apparently going within the next week or 2 for corrective surgery which will involve an external fixator. I don't have any of the details of this. 10/06/14; The patient is a 64 yrs old bf with a left Charcot foot and ulcer on the plantar. 12/01/14 Returns post surgery with [name]. Has follow up visit later this week with him, currently in facility and NWB over this extremity. States no drainage from foot and no current wound care. On exam callus and scab in place, suspect healed. Has external fixator in place. Will defer to [name] for management, ok to place moisturizing cream over scabs and callus and she will call  if  she requires follow up here. READMISSION 01/28/2019 Patient was in the clinic in 2016 for a wound on the plantar aspect of her left calcaneus. Predominantly cared for by [name] she was referred to [name] and had corrective surgery for the position of her left ankle I believe. She had previously been here in 2015 and then prior to that in 2011 2012 that I do not have these records. More recently she has developed fairly extensive wounds on the right medial foot, left lateral foot and a small area on the right medial great toe. Right medial foot wound has been there for 6 to 7 months, left foot wound for 2 to 3 months and the right great toe only over the last few weeks. She has been followed by [name] at Dr. Laverta [place] office it sounds as though she has been undergoing callus paring and silver nitrate. The patient has been washing these off with soap and water and plan applying dry dressing Past medical history, type 2 diabetes well controlled with a recent hemoglobin A1c of 7 on 11/16, type 2 diabetes with peripheral neuropathy, previous left Charcot joint surgery bilateral Charcot joints currently, stage III chronic renal failure, hypertension, obstructive sleep apnea, history of MRSA and gastroesophageal reflux. 1/18; this is a patient with bilateral Charcot feet. Plain x-rays that I did of both of these wounds that are either at bone or precariously close to bone were done. On the right foot this showed possible lytic destruction involving the anterior aspect of the talus which may represent a wound osteomyelitis. An MRI was recommended. On the left there was no definite lytic destruction although the wound is deep undermining's and I think probably requires an MRI on this basis in any case 1/25; patient was supposed to have her MRI last Friday of her bilateral feet however they would not do it because there was silver alginate in her dressings, will replace with calcium alginate  today and will see if we can get her done today 2/1; patient did not get her MRIs done last week because of issues with the MRI department receiving orders to do bilateral feet. She has 2 deep wounds in the setting of Charcot feet deformities bilaterally. Both of them at one point had exposed bone although I had trouble demonstrating that today. I am not sure that we can offload these very aggressively as I watched her leave the clinic last week her gait is very narrow and unsteady. 2/15; the MRI of her right foot did not show osteomyelitis potentially osteonecrosis of the talus. 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 The wounds are a large wound on the right medial foot and on the left a substantial wound on the left lateral plantar calcaneus. We have been using silver alginate. Ultimately I think we need to consider a total contact cast on the right while we work through the possibility of osteomyelitis in the left. I watched her walk out with her crutches I have some trepidation about the ability to walk with the cast on her right foot. Nevertheless with her Charcot deformities I do not think there is a way to heal these short of offloading them aggressively. 2/22; the culture of the bone in the left foot that I did last week showed a few Citrobacter Koseri as well as some streptococcal species. In my attempted bone for pathology did not actually yield a lot of bone the pathologist did  not identify any osteomyelitis. 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 Is promised today and aggressive debridement of the right foot and a total contact cast which surprisingly she seemed to tolerate quite well 03/13/2019 upon evaluation today patient appears to be doing well with her total contact cast she is here for  the first cast change. She had no areas of rubbing or any other complications at this point. Overall things are doing quite well. 2/26; the patient was kindly seen by Dr. Orvan Falconer of infectious disease on 2/25. She is now on IV vancomycin PICC line placement in 2 days and oral Flagyl. This was in response to her MRI showing osteomyelitis of the left calcaneus concerning for osteomyelitis. We have been putting her right leg in a total contact cast. Our nurses report drainage. She is tolerating the total contact cast well. 3/4; the patient is started her IV antibiotics and is taking IV vancomycin and oral Flagyl. She appears to be tolerating these both well. This is for osteomyelitis involving the left calcaneus. The area on the right in the setting of bilateral Charcot deformities did not have any bone infection on MRI. We put a total contact cast on her with silver alginate as the primary dressing and she returns today in follow-up. Per our intake nurse there was too much drainage to leave this on all week therefore we will bring her back for an early change 3/8; follow-up total contact cast she is still having a lot of drainage probably too much drainage from the right foot wound will week with the same cast. She will be back on Thursday. The area on the left looks somewhat better 3/11; patient here for a total contact cast change. 3/15; patient came in for wound care evaluation. She is still on IV vancomycin and oral Flagyl for osteomyelitis in the left foot. The area on the right foot we are putting in a total contact cast. 3/18 for total contact cast change on the right foot 3/22; the patient was here for wound review today. The area on the right foot is slightly smaller in terms of surface area. However the surface of the wound is very gritty and hyper granulated. More problematically the area on the plantar left foot has increased depth indeed in the center of this it probes to bone. We have  been using Hydrofera Blue in both areas. She is on antibiotics as prescribed by infectious disease IV vancomycin and oral Flagyl 3/25; the patient is in for total contact cast change. Our intake nurse was concerned about the amount of drainage which soaked right to the multiple layers we are putting on this. Wondered about options. The wound bed actually looks as healthy as this is looked although there may not be a lot of change in dimensions things are looking a lot better. If we are going to heal this woman's wound which is in the middle of her right Charcot foot this is going to need to be offloaded. There are not many alternatives 3/29; still not a lot of improvement although the surface of the right wound looks better. We have been using Hydrofera Blue 04/17/2019 upon evaluation today patient appears to be doing well with a total contact cast. With actually having to change this twice a week. She saw [name] for wound care earlier in  the week this is for the cast change today. That is due to the fact that she has significant drainage. Overall the wound is been looking excellent however. 4/5; we continue to put a total contact cast on this patient with Hydrofera Blue. Still a lot of drainage which precludes a simple weekly change or changing this twice a week. 4/8; total contact cast changed today. Apparently the wound looks better per our intake nurse 4/12; patient here for wound care evaluation. Wounds on the right foot have not changed in dimension none about a month left foot looks similar. Apparently her antibiotics that we are giving her for osteomyelitis in the left foot complete on Wednesday. We have been using Hydrofera Blue on the right foot under a total contact cast. Nurses report greenish drainage although I think this may be discoloration from the [facility] 4/15; back for cast change wounds look quite good although still moderate amount of drainage. T contact cast  reapplied otal 4/19;. Wound evaluation. Wound on the right foot is smaller surface looks healthy. There is still the divot in the middle part of this wound but I could not feel any bone. Area on the left foot also looks better She has completed her antibiotics but still has the PICC line in. 4/23; patient comes back for a total contact cast change this was done in the standard fashion no issues 4/26; wound measures slightly larger on the right foot which is disappointing. She sees Dr. Orvan Falconer with regards to her antibiotics on Wednesday. I believe she is on vancomycin IV and oral Flagyl. 4/29; in for her routine total contact cast change. She saw Dr. Orvan Falconer this morning. He is asking about antibiotic continuation I will be in touch with him. I did not look at her wound today 5/3; patient saw Dr. Orvan Falconer on 4/28. I did send him a note asking about the continuation of perhaps an oral regimen. I have not heard back. She still has an elevated sedimentation rate at 81. Her wounds are smaller. 5/6; in for her total contact cast change. We did not look at the wound today. She has seen Dr. Orvan Falconer and is now on oral Keflex and Flagyl. She seems to be tolerating these well 5/10; she had her PICC line removed. The wound on the right foot after some initial improvement has not made any improvement even with the total contact cast. Still having a lot of drainage. Likewise the area on the left foot really is not any better either 5/14; in for a cast change today. We will look at the right foot on Monday. If this is not making any progress I am going to try to cast the left foot. 5/17; the wound on the right foot is absolutely no different. I am going to put ointment on this area and change the cast of the left foot. We have been using silver alginate there 5/21; apparently the area on the right foot was better per our intake nurse although I did not see it. We did a standard total contact cast change on  the left foot we have been applying silver alginate to the wound here. 06/10/19-Area on the right foot per dimensions slightly better, the left foot has still some depth applying silver alginate to the wound with TCC 06/13/19-Patient here for Casting to Left foot 6/1; patient had the total contact cast were placed on the left foot. Noted some swelling in her right leg although the dimensions of the wound on  the right foot are better. We have been using PolyMem on the right and silver alginate on the left under the cast 6/7; we continue to make nice progress in both her plantar feet wound. We are putting the total contact cast on the left. Surprisingly in spite of this the area on the right medial foot in the middle of her Charcot deformities actually is making progress as well 07/01/2019 upon evaluation today patient actually appears to be doing quite well with regard to her wounds on the feet compared to when I saw her last that has been quite some time. With that being said I do believe the total contact cast has been beneficial in this left foot and she has a lot of signs of improvement she does have some callus overhanging which is caused a little bit of a ledge and an issue here. I do believe that we may be able to do something to help in that regard. Fortunately otherwise the wound seems to be progressing quite nicely. 6/21; the patient's area on the left foot that we are currently casting is just about closed. The area on the right medial foot has a healthy looking surface but may be not changed as much from last week in terms of surface area. We are using silver alginate on the right and polymen Ag under the cast 6/28; only a small open area on the left foot remains. Using silver alginate under a total contact cast. She has a diabetic shoe I have asked her to bring that for next week where this may be closed. The area on the right foot with a Charcot deformity is also measuring slightly smaller.  We are using PolyMem Ag here 7/6; left foot is totally closed. She does not require a total contact cast. She will graduate to her own diabetic shoe. The area on the right foot with a Charcot deformity measuring about the same. We have been using polymen under compression wraps. She did not do well in this area with a total contact cast 7/13; left foot remains intact. She is skillfully covering this area with foam and using her diabetic shoe. Slight area improvement of the right foot wound. Still rolled senescent looking edges. We have been using polymen 7/19; left foot remains intact. Wound measuring slightly smaller on the right foot in the middle of the Charcot deformity. We have been using PolyMem Ag 7/26; per the patient left foot remains intact. The wound on the right foot looks slightly smaller but dimensions are measuring the same. We have been using polymen Ag 8/9-Patient back after 2 weeks, wound of the right foot again looking slightly smaller, continuing to use PolyMem silver 8/16; patient has made some minor improvements in wound area on the right foot. Using polymen Ag. The left foot remains healed 8/23; continued slight improvement in dimensions. Rolled edges around the wound edge noted. We have been using polymen Ag. Patient offloading with a surgical shoe and crutches 8/30; no change in surface area. Thick rolled skin and subcutaneous tissue around the edges debris on the wound surface. We have been using polymen Ag initially with some improvement however stalled on the last 2 weeks in terms of surface area 9/13; surface area is smaller still using polymen Ag. Thick rolled skin and subcutaneous tissue around the edges requiring debridement. 9/20; again surface area is improved using polymen Ag however each week she develops thick rolled skin and subcutaneous tissue around the wound edges requiring debridement. She is  offloading this using crutches 9/27; we've been using PolyMem AG.  Wound does not look any different from last week. Raised edges of callus skin and subcutaneous tissue around the wound bed. Electronic Signature(s) Signed: 10/15/2019 7:37:15 AM By: Baltazar Najjar MD Entered By: Baltazar Najjar on 10/14/2019 18:59:47 -------------------------------------------------------------------------------- Physical Exam Details Patient Name: Date of Service: [name], [name] 10/14/2019 1:45 PM Medical Record Number: [id number] Patient Account Number: [id number] Date of Birth/Sex: Treating RN: [birth year] (64 y.o. [name] Primary Care Provider: MA SO UDI, [place] MA LSA DA T Other Clinician: Referring Provider: Treating Provider/Extender: Baltazar Najjar MA SO UDI, ELHA MA LSA DA T Weeks in Treatment: 73 Constitutional Patient is hypotensive. However she states she feels fine. Pulse regular and within target range for patient.Marland Kitchen Respirations regular, non-labored and within target range.. Temperature is normal and within the target range for the patient.Marland Kitchen Appears in no distress. Cardiovascular Pedal pulses are palpable bilaterally. Notes Wound exam; right medial foot plantar aspect in the setting of a Charcot deformity. Again using a #5 curette I removed callous skin and subcutaneous tissue from the wound margins. Hemostasis with silver nitrate and direct pressure Electronic Signature(s) Signed: 10/15/2019 7:37:15 AM By: Baltazar Najjar MD Entered By: Baltazar Najjar on 10/14/2019 19:01:17 -------------------------------------------------------------------------------- Physician Orders Details Patient Name: Date of Service: [name], [name] 10/14/2019 1:45 PM Medical Record Number: [id number] Patient Account Number: [id number] Date of Birth/Sex: Treating RN: 10/22/1955 (64 y.o. [name] Primary Care Provider: MA SO UDI, [place] MA LSA DA T Other Clinician: Referring Provider: Treating Provider/Extender: Baltazar Najjar MA SO UDI, ELHA MA LSA DA  T Weeks in Treatment: 25 Verbal / Phone Orders: No Diagnosis Coding ICD-10 Coding Code Description E11.621 Type 2 diabetes mellitus with foot ulcer L97.511 Non-pressure chronic ulcer of other part of right foot limited to breakdown of skin E11.42 Type 2 diabetes mellitus with diabetic polyneuropathy M14.671 Charcot's joint, right ankle and foot M14.672 Charcot's joint, left ankle and foot 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 Dressing Change Frequency Wound #9 Right,Medial Foot Do not change entire dressing for one week. Wound Cleansing Wound #9 Right,Medial Foot May shower with protection. - use cast protector Primary Wound Dressing Wound #9 Right,Medial Foot Polymem Silver Secondary Dressing Wound #9 Right,Medial Foot Foam - foam donut ABD pad Zetuvit or Kerramax Edema Control 3 Layer Compression System - Right Lower Extremity Avoid standing for long periods of time Elevate legs to the level of the heart or above for 30 minutes daily and/or when sitting, a frequency of: - throughout the day Exercise regularly Off-Loading Open toe surgical shoe to: - right foot Electronic Signature(s) Signed: 10/14/2019 5:31:38 PM By: Zandra Abts RN, BSN Signed: 10/15/2019 7:37:15 AM By: Baltazar Najjar MD Entered By: Zandra Abts on 10/14/2019 15:04:15 -------------------------------------------------------------------------------- Problem List Details Patient Name: Date of Service: [name], [name] 10/14/2019 1:45 PM Medical Record Number: [id number] Patient Account Number: [id number] Date of Birth/Sex: Treating RN: [birth year] (64 y.o. [name] Primary Care Provider: MA SO UDI, [place] MA LSA DA T Other Clinician: Referring Provider: Treating Provider/Extender: Abe People, ELHA MA LSA DA T Weeks in Treatment: 37 Active Problems ICD-10 Encounter Code Description Active Date  MDM Diagnosis E11.621 Type 2 diabetes mellitus with foot ulcer 01/28/2019 No Yes L97.511 Non-pressure chronic ulcer of other part of right foot limited to breakdown of 01/28/2019 No Yes skin E11.42 Type 2 diabetes mellitus with diabetic polyneuropathy 01/28/2019  No Yes M14.671 Charcot's joint, right ankle and foot 01/28/2019 No Yes Inactive Problems ICD-10 Code Description Active Date Inactive Date L97.524 Non-pressure chronic ulcer of other part of left foot with necrosis of bone 01/28/2019 01/28/2019 L97.514 Non-pressure chronic ulcer of other part of right foot with necrosis of bone 01/28/2019 01/28/2019 W11.914 Other chronic osteomyelitis, left ankle and foot 04/08/2019 04/08/2019 N82.956 Charcot's joint, left ankle and foot 01/28/2019 01/28/2019 Resolved Problems Electronic Signature(s) Signed: 10/15/2019 7:37:15 AM By: Baltazar Najjar MD Previous Signature: 10/14/2019 5:31:38 PM Version By: Zandra Abts RN, BSN Entered By: Baltazar Najjar on 10/14/2019 18:54:58 -------------------------------------------------------------------------------- Progress Note Details Patient Name: Date of Service: [name], [name] 10/14/2019 1:45 PM Medical Record Number: [id number] Patient Account Number: [id number] Date of Birth/Sex: Treating RN: Nov 06, 1955 (64 y.o. [name] Primary Care Provider: MA SO UDI, [place] MA LSA DA T Other Clinician: Referring Provider: Treating Provider/Extender: Baltazar Najjar MA SO UDI, ELHA MA LSA DA T Weeks in Treatment: 37 Subjective History of Present Illness (HPI) 64 yrs old bf here for follow up recurrent left charcot foot ulcer. She has DM. She is not a smoker. She states a blister developed 3 months ago at site of previous breakdown from 1 year ago. It was debrided at the podiatrist's office. She went to the ER and then sent here. She denies pain. Xray was negative for osteo. Culture from this clinic negative. 09/08/14 Referred by [name] to [name] for  Charcot foot. Seen by him and MRI scheduled 09/19/14. Prior silver collagen but this was not ordered last visit, patient has been rinsing with saline alone. Re institute silver collagen every other day. F/u 2 weeks with [name] as Labor Day holiday. Supplies ordered. 09/25/14; this is a patient with a Charcot foot on the left. she has a wound over the plantar aspect of her left calcaneus. She has been seen by [name] of orthopedic surgery and has had an MRI. She is apparently going within the next week or 2 for corrective surgery which will involve an external fixator. I don't have any of the details of this. 10/06/14; The patient is a 64 yrs old bf with a left Charcot foot and ulcer on the plantar. 12/01/14 Returns post surgery with [name]. Has follow up visit later this week with him, currently in facility and [facility] over this extremity. States no drainage from foot and no current wound care. On exam callus and scab in place, suspect healed. Has external fixator in place. Will defer to [name] for management, ok to place moisturizing cream over scabs and callus and she will call if she requires follow up here. READMISSION 01/28/2019 Patient was in the clinic in 2016 for a wound on the plantar aspect of her left calcaneus. Predominantly cared for by [name] she was referred to [name] and had corrective surgery for the position of her left ankle I believe. She had previously been here in 2015 and then prior to that in 2011 2012 that I do not have these records. More recently she has developed fairly extensive wounds on the right medial foot, left lateral foot and a small area on the right medial great toe. Right medial foot wound has been there for 6 to 7 months, left foot wound for 2 to 3 months and the right great toe only over the last few weeks. She has been followed by [name] at Dr. Laverta [place] office it sounds as though she has been undergoing callus  paring and silver  nitrate. The patient has been washing these off with soap and water and plan applying dry dressing Past medical history, type 2 diabetes well controlled with a recent hemoglobin A1c of 7 on 11/16, type 2 diabetes with peripheral neuropathy, previous left Charcot joint surgery bilateral Charcot joints currently, stage III chronic renal failure, hypertension, obstructive sleep apnea, history of MRSA and gastroesophageal reflux. 1/18; this is a patient with bilateral Charcot feet. Plain x-rays that I did of both of these wounds that are either at bone or precariously close to bone were done. On the right foot this showed possible lytic destruction involving the anterior aspect of the talus which may represent a wound osteomyelitis. An MRI was recommended. On the left there was no definite lytic destruction although the wound is deep undermining's and I think probably requires an MRI on this basis in any case 1/25; patient was supposed to have her MRI last Friday of her bilateral feet however they would not do it because there was silver alginate in her dressings, will replace with calcium alginate today and will see if we can get her done today 2/1; patient did not get her MRIs done last week because of issues with the MRI department receiving orders to do bilateral feet. She has 2 deep wounds in the setting of Charcot feet deformities bilaterally. Both of them at one point had exposed bone although I had trouble demonstrating that today. I am not sure that we can offload these very aggressively as I watched her leave the clinic last week her gait is very narrow and unsteady. 2/15; the MRI of her right foot did not show osteomyelitis potentially osteonecrosis of the talus. 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 The wounds are a large wound on the right medial foot and on the left a substantial wound on the left lateral plantar calcaneus. We have been  using silver alginate. Ultimately I think we need to consider a total contact cast on the right while we work through the possibility of osteomyelitis in the left. I watched her walk out with her crutches I have some trepidation about the ability to walk with the cast on her right foot. Nevertheless with her Charcot deformities I do not think there is a way to heal these short of offloading them aggressively. 2/22; the culture of the bone in the left foot that I did last week showed a few Citrobacter Koseri as well as some streptococcal species. In my attempted bone for pathology did not actually yield a lot of bone the pathologist did not identify any osteomyelitis. 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 Is promised today and aggressive debridement of the right foot and a total contact cast which surprisingly she seemed to tolerate quite well 03/13/2019 upon evaluation today patient appears to be doing well with her total contact cast she is here for the first cast change. She had no areas of rubbing or any other complications at this point. Overall things are doing quite well. 2/26; the patient was kindly seen by Dr. Orvan Falconer of infectious disease on 2/25. She is now on IV vancomycin PICC line placement in 2 days and oral Flagyl. This was in response to her MRI showing osteomyelitis of the left calcaneus concerning for osteomyelitis. We have  been putting her right leg in a total contact cast. Our nurses report drainage. She is tolerating the total contact cast well. 3/4; the patient is started her IV antibiotics and is taking IV vancomycin and oral Flagyl. She appears to be tolerating these both well. This is for osteomyelitis involving the left calcaneus. The area on the right in the setting of bilateral Charcot deformities did not have any bone  infection on MRI. We put a total contact cast on her with silver alginate as the primary dressing and she returns today in follow-up. Per our intake nurse there was too much drainage to leave this on all week therefore we will bring her back for an early change 3/8; follow-up total contact cast she is still having a lot of drainage probably too much drainage from the right foot wound will week with the same cast. She will be back on Thursday. The area on the left looks somewhat better 3/11; patient here for a total contact cast change. 3/15; patient came in for wound care evaluation. She is still on IV vancomycin and oral Flagyl for osteomyelitis in the left foot. The area on the right foot we are putting in a total contact cast. 3/18 for total contact cast change on the right foot 3/22; the patient was here for wound review today. The area on the right foot is slightly smaller in terms of surface area. However the surface of the wound is very gritty and hyper granulated. More problematically the area on the plantar left foot has increased depth indeed in the center of this it probes to bone. We have been using Hydrofera Blue in both areas. She is on antibiotics as prescribed by infectious disease IV vancomycin and oral Flagyl 3/25; the patient is in for total contact cast change. Our intake nurse was concerned about the amount of drainage which soaked right to the multiple layers we are putting on this. Wondered about options. The wound bed actually looks as healthy as this is looked although there may not be a lot of change in dimensions things are looking a lot better. If we are going to heal this woman's wound which is in the middle of her right Charcot foot this is going to need to be offloaded. There are not many alternatives 3/29; still not a lot of improvement although the surface of the right wound looks better. We have been using Hydrofera Blue 04/17/2019 upon evaluation today patient  appears to be doing well with a total contact cast. With actually having to change this twice a week. She saw [name] for wound care earlier in the week this is for the cast change today. That is due to the fact that she has significant drainage. Overall the wound is been looking excellent however. 4/5; we continue to put a total contact cast on this patient with Hydrofera Blue. Still a lot of drainage which precludes a simple weekly change or changing this twice a week. 4/8; total contact cast changed today. Apparently the wound looks better per our intake nurse 4/12; patient here for wound care evaluation. Wounds on the right foot have not changed in dimension none about a month left foot looks similar. Apparently her antibiotics that we are giving her for osteomyelitis in the left foot complete on Wednesday. We have been using Hydrofera Blue on the right foot under a total contact cast. Nurses report greenish drainage although I think this may be discoloration from the [facility] 4/15; back for  cast change wounds look quite good although still moderate amount of drainage. T contact cast reapplied otal 4/19;. Wound evaluation. Wound on the right foot is smaller surface looks healthy. There is still the divot in the middle part of this wound but I could not feel any bone. Area on the left foot also looks better She has completed her antibiotics but still has the PICC line in. 4/23; patient comes back for a total contact cast change this was done in the standard fashion no issues 4/26; wound measures slightly larger on the right foot which is disappointing. She sees Dr. Orvan Falconer with regards to her antibiotics on Wednesday. I believe she is on vancomycin IV and oral Flagyl. 4/29; in for her routine total contact cast change. She saw Dr. Orvan Falconer this morning. He is asking about antibiotic continuation I will be in touch with him. I did not look at her wound today 5/3; patient saw Dr.  Orvan Falconer on 4/28. I did send him a note asking about the continuation of perhaps an oral regimen. I have not heard back. She still has an elevated sedimentation rate at 81. Her wounds are smaller. 5/6; in for her total contact cast change. We did not look at the wound today. She has seen Dr. Orvan Falconer and is now on oral Keflex and Flagyl. She seems to be tolerating these well 5/10; she had her PICC line removed. The wound on the right foot after some initial improvement has not made any improvement even with the total contact cast. Still having a lot of drainage. Likewise the area on the left foot really is not any better either 5/14; in for a cast change today. We will look at the right foot on Monday. If this is not making any progress I am going to try to cast the left foot. 5/17; the wound on the right foot is absolutely no different. I am going to put ointment on this area and change the cast of the left foot. We have been using silver alginate there 5/21; apparently the area on the right foot was better per our intake nurse although I did not see it. We did a standard total contact cast change on the left foot we have been applying silver alginate to the wound here. 06/10/19-Area on the right foot per dimensions slightly better, the left foot has still some depth applying silver alginate to the wound with TCC 06/13/19-Patient here for Casting to Left foot 6/1; patient had the total contact cast were placed on the left foot. Noted some swelling in her right leg although the dimensions of the wound on the right foot are better. We have been using PolyMem on the right and silver alginate on the left under the cast 6/7; we continue to make nice progress in both her plantar feet wound. We are putting the total contact cast on the left. Surprisingly in spite of this the area on the right medial foot in the middle of her Charcot deformities actually is making progress as well 07/01/2019 upon evaluation  today patient actually appears to be doing quite well with regard to her wounds on the feet compared to when I saw her last that has been quite some time. With that being said I do believe the total contact cast has been beneficial in this left foot and she has a lot of signs of improvement she does have some callus overhanging which is caused a little bit of a ledge and an issue here. I  do believe that we may be able to do something to help in that regard. Fortunately otherwise the wound seems to be progressing quite nicely. 6/21; the patient's area on the left foot that we are currently casting is just about closed. The area on the right medial foot has a healthy looking surface but may be not changed as much from last week in terms of surface area. We are using silver alginate on the right and polymen Ag under the cast 6/28; only a small open area on the left foot remains. Using silver alginate under a total contact cast. She has a diabetic shoe I have asked her to bring that for next week where this may be closed. The area on the right foot with a Charcot deformity is also measuring slightly smaller. We are using PolyMem Ag here 7/6; left foot is totally closed. She does not require a total contact cast. She will graduate to her own diabetic shoe. ooThe area on the right foot with a Charcot deformity measuring about the same. We have been using polymen under compression wraps. She did not do well in this area with a total contact cast 7/13; left foot remains intact. She is skillfully covering this area with foam and using her diabetic shoe. Slight area improvement of the right foot wound. Still rolled senescent looking edges. We have been using polymen 7/19; left foot remains intact. Wound measuring slightly smaller on the right foot in the middle of the Charcot deformity. We have been using PolyMem Ag 7/26; per the patient left foot remains intact. The wound on the right foot looks slightly  smaller but dimensions are measuring the same. We have been using polymen Ag 8/9-Patient back after 2 weeks, wound of the right foot again looking slightly smaller, continuing to use PolyMem silver 8/16; patient has made some minor improvements in wound area on the right foot. Using polymen Ag. The left foot remains healed 8/23; continued slight improvement in dimensions. Rolled edges around the wound edge noted. We have been using polymen Ag. Patient offloading with a surgical shoe and crutches 8/30; no change in surface area. Thick rolled skin and subcutaneous tissue around the edges debris on the wound surface. We have been using polymen Ag initially with some improvement however stalled on the last 2 weeks in terms of surface area 9/13; surface area is smaller still using polymen Ag. Thick rolled skin and subcutaneous tissue around the edges requiring debridement. 9/20; again surface area is improved using polymen Ag however each week she develops thick rolled skin and subcutaneous tissue around the wound edges requiring debridement. She is offloading this using crutches 9/27; we've been using PolyMem AG. Wound does not look any different from last week. Raised edges of callus skin and subcutaneous tissue around the wound bed. Objective Constitutional Patient is hypotensive. However she states she feels fine. Pulse regular and within target range for patient.Marland Kitchen Respirations regular, non-labored and within target range.. Temperature is normal and within the target range for the patient.Marland Kitchen Appears in no distress. Vitals Time Taken: 2:05 PM, Height: 68 in, Weight: 265 lbs, BMI: 40.3, Temperature: 97.6 F, Pulse: 66 bpm, Respiratory Rate: 18 breaths/min, Blood Pressure: 88/58 mmHg. Cardiovascular Pedal pulses are palpable bilaterally. General Notes: Wound exam; right medial foot plantar aspect in the setting of a Charcot deformity. Again using a #5 curette I removed callous skin and subcutaneous  tissue from the wound margins. Hemostasis with silver nitrate and direct pressure Integumentary (Hair, Skin) Wound #9  status is Open. Original cause of wound was Blister. The wound is located on the Right,Medial Foot. The wound measures 1.1cm length x 1.2cm width x 0.3cm depth; 1.037cm^2 area and 0.311cm^3 volume. There is Fat Layer (Subcutaneous Tissue) exposed. There is no tunneling or undermining noted. There is a medium amount of serosanguineous drainage noted. The wound margin is well defined and not attached to the wound base. There is large (67-100%) red, pink granulation within the wound bed. There is no necrotic tissue within the wound bed. Assessment Active Problems ICD-10 Type 2 diabetes mellitus with foot ulcer Non-pressure chronic ulcer of other part of right foot limited to breakdown of skin Type 2 diabetes mellitus with diabetic polyneuropathy Charcot's joint, right ankle and foot Procedures Wound #9 Pre-procedure diagnosis of Wound #9 is a Diabetic Wound/Ulcer of the Lower Extremity located on the Right,Medial Foot .Severity of Tissue Pre Debridement is: Fat layer exposed. There was a Excisional Skin/Subcutaneous Tissue Debridement with a total area of 1.32 sq cm performed by [name], MD. With the following instrument(s): Curette to remove Viable and Non-Viable tissue/material. Material removed includes Callus and Subcutaneous Tissue and. No specimens were taken. A time out was conducted at 15:02, prior to the start of the procedure. A Moderate amount of bleeding was controlled with Silver Nitrate. The procedure was tolerated well with a pain level of 0 throughout and a pain level of 0 following the procedure. Post Debridement Measurements: 1.1cm length x 1.2cm width x 0.3cm depth; 0.311cm^3 volume. Character of Wound/Ulcer Post Debridement is improved. Severity of Tissue Post Debridement is: Fat layer exposed. Post procedure Diagnosis Wound #9: Same as  Pre-Procedure Pre-procedure diagnosis of Wound #9 is a Diabetic Wound/Ulcer of the Lower Extremity located on the Right,Medial Foot . There was a Three Layer Compression Therapy Procedure by Zandra Abts, RN. Post procedure Diagnosis Wound #9: Same as Pre-Procedure Plan Follow-up Appointments: Return Appointment in 1 week. Dressing Change Frequency: Wound #9 Right,Medial Foot: Do not change entire dressing for one week. Wound Cleansing: Wound #9 Right,Medial Foot: May shower with protection. - use cast protector Primary Wound Dressing: Wound #9 Right,Medial Foot: Polymem Silver Secondary Dressing: Wound #9 Right,Medial Foot: Foam - foam donut ABD pad Zetuvit or Kerramax Edema Control: 3 Layer Compression System - Right Lower Extremity Avoid standing for long periods of time Elevate legs to the level of the heart or above for 30 minutes daily and/or when sitting, a frequency of: - throughout the day Exercise regularly Off-Loading: Open toe surgical shoe to: - right foot #1 no major change in the wound this week. The same technique rolled edges around the wound. Still requiring debridement #2 I talked to her again about a total contact cast. Although this did not work on this wound previously and actually we had better results with the PolyMem AG without the total contact cast however this is currently stalled. #3 the wound is right over a Charcot bone deformity there is too much pressure here. Even though she supports herself and crutches I can't imagine that she is able to do this well enough to offload this area properly Electronic Signature(s) Signed: 10/15/2019 7:37:15 AM By: Baltazar Najjar MD Entered By: Baltazar Najjar on 10/14/2019 19:05:09 -------------------------------------------------------------------------------- SuperBill Details Patient Name: Date of Service: [name], Naria J. 10/14/2019 Medical Record Number: [id number] Patient Account Number:  [id number] Date of Birth/Sex: Treating RN: [birth year] (64 y.o. [name] Primary Care Provider: MA SO UDI, ELHA MA LSA DA T Other Clinician: Referring  Provider: Treating Provider/Extender: Baltazar Najjar MA SO UDI, ELHA MA LSA DA T Weeks in Treatment: 37 Diagnosis Coding ICD-10 Codes Code Description E11.621 Type 2 diabetes mellitus with foot ulcer L97.511 Non-pressure chronic ulcer of other part of right foot limited to breakdown of skin E11.42 Type 2 diabetes mellitus with diabetic polyneuropathy M14.671 Charcot's joint, right ankle and foot Facility Procedures CPT4 Code: [id number] Description: 11042 - DEB SUBQ TISSUE 20 SQ CM/< ICD-10 Diagnosis Description L97.511 Non-pressure chronic ulcer of other part of right foot limited to breakdown of Modifier: skin Quantity: 1 Physician Procedures : CPT4 Code Description Modifier [id number] 11042 - WC PHYS SUBQ TISS 20 SQ CM ICD-10 Diagnosis Description L97.511 Non-pressure chronic ulcer of other part of right foot limited to breakdown of skin Quantity: 1 Electronic Signature(s) Signed: 10/15/2019 7:37:15 AM By: Baltazar Najjar MD Entered By: Baltazar Najjar on 10/15/2019 03:37:07

## 2019-10-15 NOTE — Progress Notes (Signed)
[name], [name] ([id number]) Visit Report for 10/14/2019 Arrival Information Details Patient Name: Date of Service: [name], [name] 10/14/2019 1:45 PM Medical Record Number: [id number] Patient Account Number: [id number] Date of Birth/Sex: Treating RN: 11-11-1955 (64 y.o. [name] Primary Care Nina Mondor: MA SO UDI, [place] MA LSA DA T Other Clinician: Referring Manu Rubey: Treating Mariadelcarmen Corella/Extender: Baltazar Najjar MA SO UDI, ELHA MA LSA DA T Weeks in Treatment: 37 Visit Information History Since Last Visit Added or deleted any medications: No Patient Arrived: Crutches Any new allergies or adverse reactions: No Arrival Time: 14:04 Had a fall or experienced change in No Accompanied By: self activities of daily living that may affect Transfer Assistance: None risk of falls: Patient Identification Verified: Yes Signs or symptoms of abuse/neglect since last visito No Secondary Verification Process Completed: Yes Hospitalized since last visit: No Patient Requires Transmission-Based Precautions: No Implantable device outside of the clinic excluding No Patient Has Alerts: No cellular tissue based products placed in the center since last visit: Has Dressing in Place as Prescribed: Yes Pain Present Now: No Electronic Signature(s) Signed: 10/15/2019 8:24:37 AM By: Karl Ito Entered By: Karl Ito on 10/14/2019 14:05:07 -------------------------------------------------------------------------------- Compression Therapy Details Patient Name: Date of Service: [name], [name] 10/14/2019 1:45 PM Medical Record Number: [id number] Patient Account Number: [id number] Date of Birth/Sex: Treating RN: [birth year] (64 y.o. [name] Primary Care Moya Duan: MA SO UDI, Judene Companion MA LSA DA T Other Clinician: Referring Breely Panik: Treating Tawana Pasch/Extender: Baltazar Najjar MA SO UDI, ELHA MA LSA DA T Weeks in Treatment: 37 Compression Therapy Performed for Wound Assessment:  Wound #9 Right,Medial Foot Performed By: Clinician Zandra Abts, RN Compression Type: Three Layer Post Procedure Diagnosis Same as Pre-procedure Electronic Signature(s) Signed: 10/14/2019 5:31:38 PM By: Zandra Abts RN, BSN Entered By: Zandra Abts on 10/14/2019 15:04:53 -------------------------------------------------------------------------------- Encounter Discharge Information Details Patient Name: Date of Service: [name], [name] 10/14/2019 1:45 PM Medical Record Number: [id number] Patient Account Number: [id number] Date of Birth/Sex: Treating RN: 09-Sep-1955 (64 y.o. [name] Primary Care Deari Sessler: MA SO UDI, [place] MA LSA DA T Other Clinician: Referring Blossom Crume: Treating Damauri Minion/Extender: Baltazar Najjar MA SO UDI, ELHA MA LSA DA T Weeks in Treatment: 53 Encounter Discharge Information Items Post Procedure Vitals Discharge Condition: Stable Temperature (F): 97.6 Ambulatory Status: Crutches Pulse (bpm): 66 Discharge Destination: Home Respiratory Rate (breaths/min): 18 Transportation: Private Auto Blood Pressure (mmHg): 88/58 Accompanied By: self Schedule Follow-up Appointment: Yes Clinical Summary of Care: Electronic Signature(s) Signed: 10/14/2019 5:26:25 PM By: Shawn Stall Entered By: Shawn Stall on 10/14/2019 15:31:22 -------------------------------------------------------------------------------- Lower Extremity Assessment Details Patient Name: Date of Service: [name], [name] 10/14/2019 1:45 PM Medical Record Number: [id number] Patient Account Number: [id number] Date of Birth/Sex: Treating RN: 1956-01-07 (64 y.o. [name] Primary Care Jassen Sarver: MA SO UDI, [place] MA LSA DA T Other Clinician: Referring Amran Malter: Treating Alveda Vanhorne/Extender: Baltazar Najjar MA SO UDI, ELHA MA LSA DA T Weeks in Treatment: 37 Edema Assessment Assessed: [Left: No] [Right: No] Edema: [Left: N] [Right: o] Calf Left: Right: Point of Measurement: 43  cm From Medial Instep 33 cm Ankle Left: Right: Point of Measurement: 12 cm From Medial Instep 21.5 cm Vascular Assessment Pulses: Dorsalis Pedis Palpable: [Right:Yes] Electronic Signature(s) Signed: 10/14/2019 5:31:38 PM By: Zandra Abts RN, BSN Entered By: Zandra Abts on 10/14/2019 14:19:14 -------------------------------------------------------------------------------- Multi Wound Chart Details Patient Name: Date of Service: [name], Timber J. 10/14/2019 1:45 PM Medical Record Number: [id number] Patient Account Number: [id number] Date of Birth/Sex: Treating RN: 1955/06/20 (  64 y.o. [name] Primary Care Sakai Heinle: MA SO UDI, [place] MA LSA DA T Other Clinician: Referring Jonisha Kindig: Treating Dalyce Renne/Extender: Baltazar Najjar MA SO UDI, ELHA MA LSA DA T Weeks in Treatment: 37 Vital Signs Height(in): 68 Pulse(bpm): 66 Weight(lbs): 265 Blood Pressure(mmHg): 88/58 Body Mass Index(BMI): 40 Temperature(F): 97.6 Respiratory Rate(breaths/min): 18 Photos: [9:No Photos Right, Medial Foot] [N/A:N/A N/A] Wound Location: [9:Blister] [N/A:N/A] Wounding Event: [9:Diabetic Wound/Ulcer of the Lower] [N/A:N/A] Primary Etiology: [9:Extremity Glaucoma, Chronic sinus] [N/A:N/A] Comorbid History: [9:problems/congestion, Anemia, Sleep Apnea, Hypertension, Type II Diabetes, Rheumatoid Arthritis, Neuropathy 06/18/2018] [N/A:N/A] Date Acquired: [9:37] [N/A:N/A] Weeks of Treatment: [9:Open] [N/A:N/A] Wound Status: [9:1.1x1.2x0.3] [N/A:N/A] Measurements L x W x D (cm) [9:1.037] [N/A:N/A] A (cm) : rea [9:0.311] [N/A:N/A] Volume (cm) : [9:90.60%] [N/A:N/A] % Reduction in A [9:rea: 96.90%] [N/A:N/A] % Reduction in Volume: [9:Grade 2] [N/A:N/A] Classification: [9:Medium] [N/A:N/A] Exudate A mount: [9:Serosanguineous] [N/A:N/A] Exudate Type: [9:red, brown] [N/A:N/A] Exudate Color: [9:Well defined, not attached] [N/A:N/A] Wound Margin: [9:Large (67-100%)] [N/A:N/A] Granulation A mount:  [9:Red, Pink] [N/A:N/A] Granulation Quality: [9:None Present (0%)] [N/A:N/A] Necrotic A mount: [9:Fat Layer (Subcutaneous Tissue): Yes N/A] Exposed Structures: [9:Fascia: No Tendon: No Muscle: No Joint: No Bone: No Small (1-33%)] [N/A:N/A] Epithelialization: [9:Debridement - Excisional] [N/A:N/A] Debridement: Pre-procedure Verification/Time Out 15:02 [N/A:N/A] Taken: [9:Callus, Subcutaneous] [N/A:N/A] Tissue Debrided: [9:Skin/Subcutaneous Tissue] [N/A:N/A] Level: [9:1.32] [N/A:N/A] Debridement A (sq cm): [9:rea Curette] [N/A:N/A] Instrument: [9:Moderate] [N/A:N/A] Bleeding: [9:Silver Nitrate] [N/A:N/A] Hemostasis A chieved: [9:0] [N/A:N/A] Procedural Pain: [9:0] [N/A:N/A] Post Procedural Pain: [9:Procedure was tolerated well] [N/A:N/A] Debridement Treatment Response: [9:1.1x1.2x0.3] [N/A:N/A] Post Debridement Measurements L x W x D (cm) [9:0.311] [N/A:N/A] Post Debridement Volume: (cm) [9:Compression Therapy] [N/A:N/A] Procedures Performed: [9:Debridement] Treatment Notes Wound #9 (Right, Medial Foot) 1. Cleanse With Wound Cleanser Soap and water 2. Periwound Care Moisturizing lotion 3. Primary Dressing Applied Polymem Ag 4. Secondary Dressing ABD Pad Dry Gauze Foam Kerramax/Xtrasorb 6. Support Layer Applied 3 layer compression wrap Notes foam donut as secondary, stockinette. Electronic Signature(s) Signed: 10/15/2019 7:37:15 AM By: Baltazar Najjar MD Signed: 10/15/2019 11:42:07 AM By: Zandra Abts RN, BSN Entered By: Baltazar Najjar on 10/14/2019 18:55:11 -------------------------------------------------------------------------------- Multi-Disciplinary Care Plan Details Patient Name: Date of Service: [name], [name] 10/14/2019 1:45 PM Medical Record Number: [id number] Patient Account Number: [id number] Date of Birth/Sex: Treating RN: Aug 17, 1955 (64 y.o. [name] Primary Care Evans Levee: MA SO UDI, Judene Companion MA LSA DA T Other Clinician: Referring  Alain Deschene: Treating Trini Soldo/Extender: Baltazar Najjar MA SO UDI, ELHA MA LSA DA T Weeks in Treatment: 37 Active Inactive Wound/Skin Impairment Nursing Diagnoses: Impaired tissue integrity Knowledge deficit related to ulceration/compromised skin integrity Goals: Patient/caregiver will verbalize understanding of skin care regimen Date Initiated: 01/28/2019 Target Resolution Date: 11/01/2019 Goal Status: Active Interventions: Assess patient/caregiver ability to obtain necessary supplies Assess patient/caregiver ability to perform ulcer/skin care regimen upon admission and as needed Assess ulceration(s) every visit Provide education on ulcer and skin care Notes: Electronic Signature(s) Signed: 10/14/2019 5:31:38 PM By: Zandra Abts RN, BSN Entered By: Zandra Abts on 10/14/2019 17:21:40 -------------------------------------------------------------------------------- Pain Assessment Details Patient Name: Date of Service: [name], [name] 10/14/2019 1:45 PM Medical Record Number: [id number] Patient Account Number: [id number] Date of Birth/Sex: Treating RN: 22-Jul-1955 (64 y.o. [name] Primary Care Claudia Greenley: MA SO UDI, [place] MA LSA DA T Other Clinician: Referring Marvin Maenza: Treating Averyana Pillars/Extender: Baltazar Najjar MA SO UDI, ELHA MA LSA DA T Weeks in Treatment: 37 Active Problems Location of Pain Severity and Description of Pain Patient Has Paino No Site Locations Pain Management  and Medication Current Pain Management: Electronic Signature(s) Signed: 10/14/2019 5:31:38 PM By: Zandra Abts RN, BSN Signed: 10/15/2019 8:24:37 AM By: Karl Ito Entered By: Karl Ito on 10/14/2019 14:05:38 -------------------------------------------------------------------------------- Patient/Caregiver Education Details Patient Name: Date of Service: [facility] 9/27/2021andnbsp1:45 PM Medical Record Number: [id number] Patient Account Number: [id number] Date  of Birth/Gender: Treating RN: 05/04/1955 (64 y.o. [name] Primary Care Physician: MA SO UDI, Judene Companion MA LSA DA T Other Clinician: Referring Physician: Treating Physician/Extender: Baltazar Najjar MA SO UDI, ELHA MA LSA DA T Weeks in Treatment: 92 Education Assessment Education Provided To: Patient Education Topics Provided Wound/Skin Impairment: Methods: Explain/Verbal Responses: State content correctly [employer]) Signed: 10/14/2019 5:31:38 PM By: Zandra Abts RN, BSN Entered By: Zandra Abts on 10/14/2019 17:21:52 -------------------------------------------------------------------------------- Wound Assessment Details Patient Name: Date of Service: [name], [name] 10/14/2019 1:45 PM Medical Record Number: [id number] Patient Account Number: [id number] Date of Birth/Sex: Treating RN: [birth year] (63 y.o. [name] Primary Care Jyren Cerasoli: MA SO UDI, [place] MA LSA DA T Other Clinician: Referring Suzi Hernan: Treating Susie Ehresman/Extender: Baltazar Najjar MA SO UDI, ELHA MA LSA DA T Weeks in Treatment: 37 Wound Status Wound Number: 9 Primary Diabetic Wound/Ulcer of the Lower Extremity Etiology: Wound Location: Right, Medial Foot Wound Open Wounding Event: Blister Status: Date Acquired: 06/18/2018 Comorbid Glaucoma, Chronic sinus problems/congestion, Anemia, Sleep Weeks Of Treatment: 37 History: Apnea, Hypertension, Type II Diabetes, Rheumatoid Arthritis, Clustered Wound: No Neuropathy Wound Measurements Length: (cm) 1.1 Width: (cm) 1.2 Depth: (cm) 0.3 Area: (cm) 1.037 Volume: (cm) 0.311 % Reduction in Area: 90.6% % Reduction in Volume: 96.9% Epithelialization: Small (1-33%) Tunneling: No Undermining: No Wound Description Classification: Grade 2 Wound Margin: Well defined, not attached Exudate Amount: Medium Exudate Type: Serosanguineous Exudate Color: red, brown Foul Odor After Cleansing: No Slough/Fibrino No Wound Bed Granulation  Amount: Large (67-100%) Exposed Structure Granulation Quality: Red, Pink Fascia Exposed: No Necrotic Amount: None Present (0%) Fat Layer (Subcutaneous Tissue) Exposed: Yes Tendon Exposed: No Muscle Exposed: No Joint Exposed: No Bone Exposed: No Treatment Notes Wound #9 (Right, Medial Foot) 1. Cleanse With Wound Cleanser Soap and water 2. Periwound Care Moisturizing lotion 3. Primary Dressing Applied Polymem Ag 4. Secondary Dressing ABD Pad Dry Gauze Foam Kerramax/Xtrasorb 6. Support Layer Applied 3 layer compression wrap Notes foam donut as secondary, stockinette. Electronic Signature(s) Signed: 10/14/2019 5:31:38 PM By: Zandra Abts RN, BSN Entered By: Zandra Abts on 10/14/2019 14:20:24 -------------------------------------------------------------------------------- Vitals Details Patient Name: Date of Service: [name], [name] 10/14/2019 1:45 PM Medical Record Number: [id number] Patient Account Number: [id number] Date of Birth/Sex: Treating RN: Sep 24, 1955 (64 y.o. [name] Primary Care Aymar Whitfill: MA SO UDI, [place] MA LSA DA T Other Clinician: Referring Genavieve Mangiapane: Treating Benita Boonstra/Extender: Baltazar Najjar MA SO UDI, ELHA MA LSA DA T Weeks in Treatment: 37 Vital Signs Time Taken: 14:05 Temperature (F): 97.6 Height (in): 68 Pulse (bpm): 66 Weight (lbs): 265 Respiratory Rate (breaths/min): 18 Body Mass Index (BMI): 40.3 Blood Pressure (mmHg): 88/58 Reference Range: 80 - 120 mg / dl Electronic Signature(s) Signed: 10/15/2019 8:24:37 AM By: Karl Ito Entered By: Karl Ito on 10/14/2019 14:05:31

## 2019-10-21 ENCOUNTER — Encounter (HOSPITAL_BASED_OUTPATIENT_CLINIC_OR_DEPARTMENT_OTHER): Payer: Medicare Other | Admitting: Internal Medicine

## 2019-10-22 ENCOUNTER — Encounter (HOSPITAL_BASED_OUTPATIENT_CLINIC_OR_DEPARTMENT_OTHER): Payer: Medicare Other | Attending: Internal Medicine | Admitting: Internal Medicine

## 2019-10-22 DIAGNOSIS — I129 Hypertensive chronic kidney disease with stage 1 through stage 4 chronic kidney disease, or unspecified chronic kidney disease: Secondary | ICD-10-CM | POA: Diagnosis not present

## 2019-10-22 DIAGNOSIS — N183 Chronic kidney disease, stage 3 unspecified: Secondary | ICD-10-CM | POA: Insufficient documentation

## 2019-10-22 DIAGNOSIS — M069 Rheumatoid arthritis, unspecified: Secondary | ICD-10-CM | POA: Diagnosis not present

## 2019-10-22 DIAGNOSIS — E11621 Type 2 diabetes mellitus with foot ulcer: Secondary | ICD-10-CM | POA: Diagnosis not present

## 2019-10-22 DIAGNOSIS — E1122 Type 2 diabetes mellitus with diabetic chronic kidney disease: Secondary | ICD-10-CM | POA: Insufficient documentation

## 2019-10-22 DIAGNOSIS — E11610 Type 2 diabetes mellitus with diabetic neuropathic arthropathy: Secondary | ICD-10-CM | POA: Insufficient documentation

## 2019-10-22 DIAGNOSIS — Z8614 Personal history of Methicillin resistant Staphylococcus aureus infection: Secondary | ICD-10-CM | POA: Insufficient documentation

## 2019-10-22 DIAGNOSIS — L97511 Non-pressure chronic ulcer of other part of right foot limited to breakdown of skin: Secondary | ICD-10-CM | POA: Insufficient documentation

## 2019-10-22 DIAGNOSIS — E1142 Type 2 diabetes mellitus with diabetic polyneuropathy: Secondary | ICD-10-CM | POA: Insufficient documentation

## 2019-10-22 DIAGNOSIS — D631 Anemia in chronic kidney disease: Secondary | ICD-10-CM | POA: Insufficient documentation

## 2019-10-22 NOTE — Progress Notes (Signed)
Sarah, [name] ([id number]) Visit Report for 10/22/2019 Debridement Details Patient Name: Date of Service: [name], [name] 10/22/2019 9:45 A M Medical Record Number: [id number] Patient Account Number: [id number] Date of Birth/Sex: Treating RN: Aug 30, 1955 (64 y.o. [name] Primary Care Provider: MA SO UDI, [place] MA LSA DA T Other Clinician: Referring Provider: Treating Provider/Extender: Baltazar Najjar MA SO UDI, ELHA MA LSA DA T Weeks in Treatment: 38 Debridement Performed for Assessment: Wound #9 Right,Medial Foot Performed By: Physician [name], MD Debridement Type: Debridement Severity of Tissue Pre Debridement: Fat layer exposed Level of Consciousness (Pre-procedure): Awake and Alert Pre-procedure Verification/Time Out Yes - 10:52 Taken: Start Time: 10:52 Pain Control: Lidocaine 5% topical ointment T Area Debrided (L x W): otal 1 (cm) x 1.2 (cm) = 1.2 (cm) Tissue and other material debrided: Non-Viable, Callus, Skin: Dermis , Skin: Epidermis Level: Skin/Epidermis Debridement Description: Selective/Open Wound Instrument: Curette Bleeding: Minimum Hemostasis Achieved: Pressure End Time: 10:54 Procedural Pain: 0 Post Procedural Pain: 0 Response to Treatment: Procedure was tolerated well Level of Consciousness (Post- Awake and Alert procedure): Post Debridement Measurements of Total Wound Length: (cm) 1 Width: (cm) 1.2 Depth: (cm) 0.3 Volume: (cm) 0.283 Character of Wound/Ulcer Post Debridement: Improved Severity of Tissue Post Debridement: Fat layer exposed Post Procedure Diagnosis Same as Pre-procedure Electronic Signature(s) Signed: 10/22/2019 5:35:40 PM By: Yevonne Pax RN Signed: 10/22/2019 5:35:49 PM By: Baltazar Najjar MD Entered By: Baltazar Najjar on 10/22/2019 11:13:03 -------------------------------------------------------------------------------- HPI Details Patient Name: Date of Service: Sarah CH, [name] 10/22/2019 9:45 A  M Medical Record Number: [id number] Patient Account Number: [id number] Date of Birth/Sex: Treating RN: 08-Dec-1955 (64 y.o. [name] Primary Care Provider: MA SO UDI, [place] MA LSA DA T Other Clinician: Referring Provider: Treating Provider/Extender: Baltazar Najjar MA SO UDI, ELHA MA LSA DA T Weeks in Treatment: 28 History of Present Illness HPI Description: 64 yrs old bf here for follow up recurrent left charcot foot ulcer. She has DM. She is not a smoker. She states a blister developed 3 months ago at site of previous breakdown from 1 year ago. It was debrided at the podiatrist's office. She went to the ER and then sent here. She denies pain. Xray was negative for osteo. Culture from this clinic negative. 09/08/14 [address] [name] to [name] for Charcot foot. Seen by him and MRI scheduled 09/19/14. Prior silver collagen but this was not ordered last visit, patient has been rinsing with saline alone. Re institute silver collagen every other day. F/u 2 weeks with [name] as Labor Day holiday. Supplies ordered. 09/25/14; this is a patient with a Charcot foot on the left. she has a wound over the plantar aspect of her left calcaneus. She has been seen by [name] of orthopedic surgery and has had an MRI. She is apparently going within the next week or 2 for corrective surgery which will involve an external fixator. I don't have any of the details of this. 10/06/14; The patient is a 64 yrs old bf with a left Charcot foot and ulcer on the plantar. 12/01/14 Returns post surgery with [name]. Has follow up visit later this week with him, currently in facility and NWB over this extremity. States no drainage from foot and no current wound care. On exam callus and scab in place, suspect healed. Has external fixator in place. Will defer to [name] for management, ok to place moisturizing cream over scabs and callus and she will call if she  requires follow up  here. READMISSION 01/28/2019 Patient was in the clinic in 2016 for a wound on the plantar aspect of her left calcaneus. Predominantly cared for by [name] she was referred to [name] and had corrective surgery for the position of her left ankle I believe. She had previously been here in 2015 and then prior to that in 2011 2012 that I do not have these records. More recently she has developed fairly extensive wounds on the right medial foot, left lateral foot and a small area on the right medial great toe. Right medial foot wound has been there for 6 to 7 months, left foot wound for 2 to 3 months and the right great toe only over the last few weeks. She has been followed by [name] at Dr. Laverta [place] office it sounds as though she has been undergoing callus paring and silver nitrate. The patient has been washing these off with soap and water and plan applying dry dressing Past medical history, type 2 diabetes well controlled with a recent hemoglobin A1c of 7 on 11/16, type 2 diabetes with peripheral neuropathy, previous left Charcot joint surgery bilateral Charcot joints currently, stage III chronic renal failure, hypertension, obstructive sleep apnea, history of MRSA and gastroesophageal reflux. 1/18; this is a patient with bilateral Charcot feet. Plain x-rays that I did of both of these wounds that are either at bone or precariously close to bone were done. On the right foot this showed possible lytic destruction involving the anterior aspect of the talus which may represent a wound osteomyelitis. An MRI was recommended. On the left there was no definite lytic destruction although the wound is deep undermining's and I think probably requires an MRI on this basis in any case 1/25; patient was supposed to have her MRI last Friday of her bilateral feet however they would not do it because there was silver alginate in her dressings, will replace with calcium alginate today and will see if we  can get her done today 2/1; patient did not get her MRIs done last week because of issues with the MRI department receiving orders to do bilateral feet. She has 2 deep wounds in the setting of Charcot feet deformities bilaterally. Both of them at one point had exposed bone although I had trouble demonstrating that today. I am not sure that we can offload these very aggressively as I watched her leave the clinic last week her gait is very narrow and unsteady. 2/15; the MRI of her right foot did not show osteomyelitis potentially osteonecrosis of the talus. 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 The wounds are a large wound on the right medial foot and on the left a substantial wound on the left lateral plantar calcaneus. We have been using silver alginate. Ultimately I think we need to consider a total contact cast on the right while we work through the possibility of osteomyelitis in the left. I watched her walk out with her crutches I have some trepidation about the ability to walk with the cast on her right foot. Nevertheless with her Charcot deformities I do not think there is a way to heal these short of offloading them aggressively. 2/22; the culture of the bone in the left foot that I did last week showed a few Citrobacter Koseri as well as some streptococcal species. In my attempted bone for pathology did not actually yield a lot of bone the pathologist did not identify  any osteomyelitis. 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 Is promised today and aggressive debridement of the right foot and a total contact cast which surprisingly she seemed to tolerate quite well 03/13/2019 upon evaluation today patient appears to be doing well with her total contact cast she is here for the first cast change.  She had no areas of rubbing or any other complications at this point. Overall things are doing quite well. 2/26; the patient was kindly seen by Dr. Orvan Falconer of infectious disease on 2/25. She is now on IV vancomycin PICC line placement in 2 days and oral Flagyl. This was in response to her MRI showing osteomyelitis of the left calcaneus concerning for osteomyelitis. We have been putting her right leg in a total contact cast. Our nurses report drainage. She is tolerating the total contact cast well. 3/4; the patient is started her IV antibiotics and is taking IV vancomycin and oral Flagyl. She appears to be tolerating these both well. This is for osteomyelitis involving the left calcaneus. The area on the right in the setting of bilateral Charcot deformities did not have any bone infection on MRI. We put a total contact cast on her with silver alginate as the primary dressing and she returns today in follow-up. Per our intake nurse there was too much drainage to leave this on all week therefore we will bring her back for an early change 3/8; follow-up total contact cast she is still having a lot of drainage probably too much drainage from the right foot wound will week with the same cast. She will be back on Thursday. The area on the left looks somewhat better 3/11; patient here for a total contact cast change. 3/15; patient came in for wound care evaluation. She is still on IV vancomycin and oral Flagyl for osteomyelitis in the left foot. The area on the right foot we are putting in a total contact cast. 3/18 for total contact cast change on the right foot 3/22; the patient was here for wound review today. The area on the right foot is slightly smaller in terms of surface area. However the surface of the wound is very gritty and hyper granulated. More problematically the area on the plantar left foot has increased depth indeed in the center of this it probes to bone. We have been using Hydrofera  Blue in both areas. She is on antibiotics as prescribed by infectious disease IV vancomycin and oral Flagyl 3/25; the patient is in for total contact cast change. Our intake nurse was concerned about the amount of drainage which soaked right to the multiple layers we are putting on this. Wondered about options. The wound bed actually looks as healthy as this is looked although there may not be a lot of change in dimensions things are looking a lot better. If we are going to heal this woman's wound which is in the middle of her right Charcot foot this is going to need to be offloaded. There are not many alternatives 3/29; still not a lot of improvement although the surface of the right wound looks better. We have been using Hydrofera Blue 04/17/2019 upon evaluation today patient appears to be doing well with a total contact cast. With actually having to change this twice a week. She saw [name] for wound care earlier in the week  this is for the cast change today. That is due to the fact that she has significant drainage. Overall the wound is been looking excellent however. 4/5; we continue to put a total contact cast on this patient with Hydrofera Blue. Still a lot of drainage which precludes a simple weekly change or changing this twice a week. 4/8; total contact cast changed today. Apparently the wound looks better per our intake nurse 4/12; patient here for wound care evaluation. Wounds on the right foot have not changed in dimension none about a month left foot looks similar. Apparently her antibiotics that we are giving her for osteomyelitis in the left foot complete on Wednesday. We have been using Hydrofera Blue on the right foot under a total contact cast. Nurses report greenish drainage although I think this may be discoloration from the [facility] 4/15; back for cast change wounds look quite good although still moderate amount of drainage. T contact cast reapplied otal 4/19;. Wound  evaluation. Wound on the right foot is smaller surface looks healthy. There is still the divot in the middle part of this wound but I could not feel any bone. Area on the left foot also looks better She has completed her antibiotics but still has the PICC line in. 4/23; patient comes back for a total contact cast change this was done in the standard fashion no issues 4/26; wound measures slightly larger on the right foot which is disappointing. She sees Dr. Orvan Falconer with regards to her antibiotics on Wednesday. I believe she is on vancomycin IV and oral Flagyl. 4/29; in for her routine total contact cast change. She saw Dr. Orvan Falconer this morning. He is asking about antibiotic continuation I will be in touch with him. I did not look at her wound today 5/3; patient saw Dr. Orvan Falconer on 4/28. I did send him a note asking about the continuation of perhaps an oral regimen. I have not heard back. She still has an elevated sedimentation rate at 81. Her wounds are smaller. 5/6; in for her total contact cast change. We did not look at the wound today. She has seen Dr. Orvan Falconer and is now on oral Keflex and Flagyl. She seems to be tolerating these well 5/10; she had her PICC line removed. The wound on the right foot after some initial improvement has not made any improvement even with the total contact cast. Still having a lot of drainage. Likewise the area on the left foot really is not any better either 5/14; in for a cast change today. We will look at the right foot on Monday. If this is not making any progress I am going to try to cast the left foot. 5/17; the wound on the right foot is absolutely no different. I am going to put ointment on this area and change the cast of the left foot. We have been using silver alginate there 5/21; apparently the area on the right foot was better per our intake nurse although I did not see it. We did a standard total contact cast change on the left foot we have been  applying silver alginate to the wound here. 06/10/19-Area on the right foot per dimensions slightly better, the left foot has still some depth applying silver alginate to the wound with TCC 06/13/19-Patient here for Casting to Left foot 6/1; patient had the total contact cast were placed on the left foot. Noted some swelling in her right leg although the dimensions of the wound on the right  foot are better. We have been using PolyMem on the right and silver alginate on the left under the cast 6/7; we continue to make nice progress in both her plantar feet wound. We are putting the total contact cast on the left. Surprisingly in spite of this the area on the right medial foot in the middle of her Charcot deformities actually is making progress as well 07/01/2019 upon evaluation today patient actually appears to be doing quite well with regard to her wounds on the feet compared to when I saw her last that has been quite some time. With that being said I do believe the total contact cast has been beneficial in this left foot and she has a lot of signs of improvement she does have some callus overhanging which is caused a little bit of a ledge and an issue here. I do believe that we may be able to do something to help in that regard. Fortunately otherwise the wound seems to be progressing quite nicely. 6/21; the patient's area on the left foot that we are currently casting is just about closed. The area on the right medial foot has a healthy looking surface but may be not changed as much from last week in terms of surface area. We are using silver alginate on the right and polymen Ag under the cast 6/28; only a small open area on the left foot remains. Using silver alginate under a total contact cast. She has a diabetic shoe I have asked her to bring that for next week where this may be closed. The area on the right foot with a Charcot deformity is also measuring slightly smaller. We are using PolyMem Ag  here 7/6; left foot is totally closed. She does not require a total contact cast. She will graduate to her own diabetic shoe. The area on the right foot with a Charcot deformity measuring about the same. We have been using polymen under compression wraps. She did not do well in this area with a total contact cast 7/13; left foot remains intact. She is skillfully covering this area with foam and using her diabetic shoe. Slight area improvement of the right foot wound. Still rolled senescent looking edges. We have been using polymen 7/19; left foot remains intact. Wound measuring slightly smaller on the right foot in the middle of the Charcot deformity. We have been using PolyMem Ag 7/26; per the patient left foot remains intact. The wound on the right foot looks slightly smaller but dimensions are measuring the same. We have been using polymen Ag 8/9-Patient back after 2 weeks, wound of the right foot again looking slightly smaller, continuing to use PolyMem silver 8/16; patient has made some minor improvements in wound area on the right foot. Using polymen Ag. The left foot remains healed 8/23; continued slight improvement in dimensions. Rolled edges around the wound edge noted. We have been using polymen Ag. Patient offloading with a surgical shoe and crutches 8/30; no change in surface area. Thick rolled skin and subcutaneous tissue around the edges debris on the wound surface. We have been using polymen Ag initially with some improvement however stalled on the last 2 weeks in terms of surface area 9/13; surface area is smaller still using polymen Ag. Thick rolled skin and subcutaneous tissue around the edges requiring debridement. 9/20; again surface area is improved using polymen Ag however each week she develops thick rolled skin and subcutaneous tissue around the wound edges requiring debridement. She is offloading this  using crutches 9/27; we've been using PolyMem AG. Wound does not look any  different from last week. Raised edges of callus skin and subcutaneous tissue around the wound bed. 10/5; we been using polymen Ag. No ability to put a total contact cast on today in the clinic. Wound is about the same still rolled edges of thick callus around the margins Electronic Signature(s) Signed: 10/22/2019 5:35:49 PM By: Baltazar Najjar MD Entered By: Baltazar Najjar on 10/22/2019 11:13:33 -------------------------------------------------------------------------------- Physical Exam Details Patient Name: Date of Service: [name], Caress J. 10/22/2019 9:45 A M Medical Record Number: [id number] Patient Account Number: [id number] Date of Birth/Sex: Treating RN: [birth year] (64 y.o. [name] Primary Care Provider: MA SO UDI, [place] MA LSA DA T Other Clinician: Referring Provider: Treating Provider/Extender: Baltazar Najjar MA SO UDI, ELHA MA LSA DA T Weeks in Treatment: 28 Constitutional Sitting or standing Blood Pressure is within target range for patient.. Pulse regular and within target range for patient.Marland Kitchen Respirations regular, non-labored and within target range.. Temperature is normal and within the target range for the patient.Marland Kitchen Appears in no distress. Notes Wound exam; right medial foot plantar aspect in the setting of a Charcot deformity in this area. I used a #5 curette to remove callus and skin from around the wound edge the wound itself looks and feels just fine. No need for subcutaneous debridement. Electronic Signature(s) Signed: 10/22/2019 5:35:49 PM By: Baltazar Najjar MD Entered By: Baltazar Najjar on 10/22/2019 11:14:53 -------------------------------------------------------------------------------- Physician Orders Details Patient Name: Date of Service: [facility], Tyrihanna J. 10/22/2019 9:45 A M Medical Record Number: [id number] Patient Account Number: [id number] Date of Birth/Sex: Treating RN: 1955/10/19 (64 y.o. F) Epps, Carrie Primary Care Provider: MA SO  UDI, [place] MA LSA DA T Other Clinician: Referring Provider: Treating Provider/Extender: Baltazar Najjar MA SO UDI, ELHA MA LSA DA T Weeks in Treatment: 75 Verbal / Phone Orders: No Diagnosis Coding ICD-10 Coding Code Description E11.621 Type 2 diabetes mellitus with foot ulcer L97.511 Non-pressure chronic ulcer of other part of right foot limited to breakdown of skin E11.42 Type 2 diabetes mellitus with diabetic polyneuropathy M14.671 Charcot's joint, right ankle and foot Follow-up Appointments Return Appointment in 1 week. Dressing Change Frequency Wound #9 Right,Medial Foot Do not change entire dressing for one week. Wound Cleansing Wound #9 Right,Medial Foot May shower with protection. - use cast protector Primary Wound Dressing Wound #9 Right,Medial Foot Polymem Silver Secondary Dressing Wound #9 Right,Medial Foot Foam - foam donut ABD pad Zetuvit or Kerramax Edema Control 3 Layer Compression System - Right Lower Extremity Avoid standing for long periods of time Elevate legs to the level of the heart or above for 30 minutes daily and/or when sitting, a frequency of: - throughout the day Exercise regularly Off-Loading Open toe surgical shoe to: - right foot Electronic Signature(s) Signed: 10/22/2019 5:35:40 PM By: Yevonne Pax RN Signed: 10/22/2019 5:35:49 PM By: Baltazar Najjar MD Entered By: Yevonne Pax on 10/22/2019 10:26:49 -------------------------------------------------------------------------------- Problem List Details Patient Name: Date of Service: Sarah CH, Brylan J. 10/22/2019 9:45 A M Medical Record Number: [id number] Patient Account Number: [id number] Date of Birth/Sex: Treating RN: 1955/08/10 (64 y.o. [name] Primary Care Provider: MA Hedy Jacob MA LSA DA T Other Clinician: Referring Provider: Treating Provider/Extender: Abe People, ELHA MA LSA DA T Weeks in Treatment: 64 Active Problems ICD-10 Encounter Code  Description Active Date MDM Diagnosis E11.621 Type 2 diabetes mellitus with foot ulcer 01/28/2019 No Yes L97.511 Non-pressure chronic ulcer of  other part of right foot limited to breakdown of 01/28/2019 No Yes skin E11.42 Type 2 diabetes mellitus with diabetic polyneuropathy 01/28/2019 No Yes M14.671 Charcot's joint, right ankle and foot 01/28/2019 No Yes Inactive Problems ICD-10 Code Description Active Date Inactive Date L97.524 Non-pressure chronic ulcer of other part of left foot with necrosis of bone 01/28/2019 01/28/2019 L97.514 Non-pressure chronic ulcer of other part of right foot with necrosis of bone 01/28/2019 01/28/2019 R42.706 Charcot's joint, left ankle and foot 01/28/2019 01/28/2019 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 10/22/2019 9:45 A M Medical Record Number: [id number] Patient Account Number: [id number] Date of Birth/Sex: Treating RN: 08-06-1955 (64 y.o. [name] Primary Care Provider: MA SO UDI, [place] MA LSA DA T Other Clinician: Referring Provider: Treating Provider/Extender: Baltazar Najjar MA SO UDI, ELHA MA LSA DA T Weeks in Treatment: 75 Subjective History of Present Illness (HPI) 64 yrs old bf here for follow up recurrent left charcot foot ulcer. She has DM. She is not a smoker. She states a blister developed 3 months ago at site of previous breakdown from 1 year ago. It was debrided at the podiatrist's office. She went to the ER and then sent here. She denies pain. Xray was negative for osteo. Culture from this clinic negative. 09/08/14 Referred by [name] to [name] for Charcot foot. Seen by him and MRI scheduled 09/19/14.  Prior silver collagen but this was not ordered last visit, patient has been rinsing with saline alone. Re institute silver collagen every other day. F/u 2 weeks with [name] as Labor Day holiday. Supplies ordered. 09/25/14; this is a patient with a Charcot foot on the left. she has a wound over the plantar aspect of her left calcaneus. She has been seen by [name] of orthopedic surgery and has had an MRI. She is apparently going within the next week or 2 for corrective surgery which will involve an external fixator. I don't have any of the details of this. 10/06/14; The patient is a 64 yrs old bf with a left Charcot foot and ulcer on the plantar. 12/01/14 Returns post surgery with [name]. Has follow up visit later this week with him, currently in facility and [facility] over this extremity. States no drainage from foot and no current wound care. On exam callus and scab in place, suspect healed. Has external fixator in place. Will defer to [name] for management, ok to place moisturizing cream over scabs and callus and she will call if she requires follow up here. READMISSION 01/28/2019 Patient was in the clinic in 2016 for a wound on the plantar aspect of her left calcaneus. Predominantly cared for by [name] she was referred to [name] and had corrective surgery for the position of her left ankle I believe. She had previously been here in 2015 and then prior to that in 2011 2012 that I do not have these records. More recently she has developed fairly extensive wounds on the right medial foot, left lateral foot and a small area on the right medial great toe. Right medial foot wound has been there for 6 to 7 months, left foot wound for 2 to 3 months and the right great toe only over the last few weeks. She has been followed by [name] at  Dr. Laverta [place] office it sounds as though she has been undergoing callus paring and silver nitrate. The patient has been washing these off with soap  and water and plan applying dry dressing Past medical history, type 2 diabetes well controlled with a recent hemoglobin A1c of 7 on 11/16, type 2 diabetes with peripheral neuropathy, previous left Charcot joint surgery bilateral Charcot joints currently, stage III chronic renal failure, hypertension, obstructive sleep apnea, history of MRSA and gastroesophageal reflux. 1/18; this is a patient with bilateral Charcot feet. Plain x-rays that I did of both of these wounds that are either at bone or precariously close to bone were done. On the right foot this showed possible lytic destruction involving the anterior aspect of the talus which may represent a wound osteomyelitis. An MRI was recommended. On the left there was no definite lytic destruction although the wound is deep undermining's and I think probably requires an MRI on this basis in any case 1/25; patient was supposed to have her MRI last Friday of her bilateral feet however they would not do it because there was silver alginate in her dressings, will replace with calcium alginate today and will see if we can get her done today 2/1; patient did not get her MRIs done last week because of issues with the MRI department receiving orders to do bilateral feet. She has 2 deep wounds in the setting of Charcot feet deformities bilaterally. Both of them at one point had exposed bone although I had trouble demonstrating that today. I am not sure that we can offload these very aggressively as I watched her leave the clinic last week her gait is very narrow and unsteady. 2/15; the MRI of her right foot did not show osteomyelitis potentially osteonecrosis of the talus. 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 The wounds are a large wound on the right medial foot and on the left a substantial wound on the left lateral plantar calcaneus. We have been using silver alginate. Ultimately I think we need to  consider a total contact cast on the right while we work through the possibility of osteomyelitis in the left. I watched her walk out with her crutches I have some trepidation about the ability to walk with the cast on her right foot. Nevertheless with her Charcot deformities I do not think there is a way to heal these short of offloading them aggressively. 2/22; the culture of the bone in the left foot that I did last week showed a few Citrobacter Koseri as well as some streptococcal species. In my attempted bone for pathology did not actually yield a lot of bone the pathologist did not identify any osteomyelitis. 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 Is promised today and aggressive debridement of the right foot and a total contact cast which surprisingly she seemed to tolerate quite well 03/13/2019 upon evaluation today patient appears to be doing well with her total contact cast she is here for the first cast change. She had no areas of rubbing or any other complications at this point. Overall things are doing quite well. 2/26; the patient was kindly seen by Dr. Orvan Falconer of infectious disease on 2/25. She is now on IV vancomycin PICC line placement in 2 days and oral Flagyl. This was in response to her  MRI showing osteomyelitis of the left calcaneus concerning for osteomyelitis. We have been putting her right leg in a total contact cast. Our nurses report drainage. She is tolerating the total contact cast well. 3/4; the patient is started her IV antibiotics and is taking IV vancomycin and oral Flagyl. She appears to be tolerating these both well. This is for osteomyelitis involving the left calcaneus. The area on the right in the setting of bilateral Charcot deformities did not have any bone infection on MRI. We put a total contact cast on her with  silver alginate as the primary dressing and she returns today in follow-up. Per our intake nurse there was too much drainage to leave this on all week therefore we will bring her back for an early change 3/8; follow-up total contact cast she is still having a lot of drainage probably too much drainage from the right foot wound will week with the same cast. She will be back on Thursday. The area on the left looks somewhat better 3/11; patient here for a total contact cast change. 3/15; patient came in for wound care evaluation. She is still on IV vancomycin and oral Flagyl for osteomyelitis in the left foot. The area on the right foot we are putting in a total contact cast. 3/18 for total contact cast change on the right foot 3/22; the patient was here for wound review today. The area on the right foot is slightly smaller in terms of surface area. However the surface of the wound is very gritty and hyper granulated. More problematically the area on the plantar left foot has increased depth indeed in the center of this it probes to bone. We have been using Hydrofera Blue in both areas. She is on antibiotics as prescribed by infectious disease IV vancomycin and oral Flagyl 3/25; the patient is in for total contact cast change. Our intake nurse was concerned about the amount of drainage which soaked right to the multiple layers we are putting on this. Wondered about options. The wound bed actually looks as healthy as this is looked although there may not be a lot of change in dimensions things are looking a lot better. If we are going to heal this woman's wound which is in the middle of her right Charcot foot this is going to need to be offloaded. There are not many alternatives 3/29; still not a lot of improvement although the surface of the right wound looks better. We have been using Hydrofera Blue 04/17/2019 upon evaluation today patient appears to be doing well with a total contact cast. With  actually having to change this twice a week. She saw [name] for wound care earlier in the week this is for the cast change today. That is due to the fact that she has significant drainage. Overall the wound is been looking excellent however. 4/5; we continue to put a total contact cast on this patient with Hydrofera Blue. Still a lot of drainage which precludes a simple weekly change or changing this twice a week. 4/8; total contact cast changed today. Apparently the wound looks better per our intake nurse 4/12; patient here for wound care evaluation. Wounds on the right foot have not changed in dimension none about a month left foot looks similar. Apparently her antibiotics that we are giving her for osteomyelitis in the left foot complete on Wednesday. We have been using Hydrofera Blue on the right foot under a total contact cast. Nurses report greenish drainage although I  think this may be discoloration from the [facility] 4/15; back for cast change wounds look quite good although still moderate amount of drainage. T contact cast reapplied otal 4/19;. Wound evaluation. Wound on the right foot is smaller surface looks healthy. There is still the divot in the middle part of this wound but I could not feel any bone. Area on the left foot also looks better She has completed her antibiotics but still has the PICC line in. 4/23; patient comes back for a total contact cast change this was done in the standard fashion no issues 4/26; wound measures slightly larger on the right foot which is disappointing. She sees Dr. Orvan Falconer with regards to her antibiotics on Wednesday. I believe she is on vancomycin IV and oral Flagyl. 4/29; in for her routine total contact cast change. She saw Dr. Orvan Falconer this morning. He is asking about antibiotic continuation I will be in touch with him. I did not look at her wound today 5/3; patient saw Dr. Orvan Falconer on 4/28. I did send him a note asking about the  continuation of perhaps an oral regimen. I have not heard back. She still has an elevated sedimentation rate at 81. Her wounds are smaller. 5/6; in for her total contact cast change. We did not look at the wound today. She has seen Dr. Orvan Falconer and is now on oral Keflex and Flagyl. She seems to be tolerating these well 5/10; she had her PICC line removed. The wound on the right foot after some initial improvement has not made any improvement even with the total contact cast. Still having a lot of drainage. Likewise the area on the left foot really is not any better either 5/14; in for a cast change today. We will look at the right foot on Monday. If this is not making any progress I am going to try to cast the left foot. 5/17; the wound on the right foot is absolutely no different. I am going to put ointment on this area and change the cast of the left foot. We have been using silver alginate there 5/21; apparently the area on the right foot was better per our intake nurse although I did not see it. We did a standard total contact cast change on the left foot we have been applying silver alginate to the wound here. 06/10/19-Area on the right foot per dimensions slightly better, the left foot has still some depth applying silver alginate to the wound with TCC 06/13/19-Patient here for Casting to Left foot 6/1; patient had the total contact cast were placed on the left foot. Noted some swelling in her right leg although the dimensions of the wound on the right foot are better. We have been using PolyMem on the right and silver alginate on the left under the cast 6/7; we continue to make nice progress in both her plantar feet wound. We are putting the total contact cast on the left. Surprisingly in spite of this the area on the right medial foot in the middle of her Charcot deformities actually is making progress as well 07/01/2019 upon evaluation today patient actually appears to be doing quite well with  regard to her wounds on the feet compared to when I saw her last that has been quite some time. With that being said I do believe the total contact cast has been beneficial in this left foot and she has a lot of signs of improvement she does have some callus overhanging which is  caused a little bit of a ledge and an issue here. I do believe that we may be able to do something to help in that regard. Fortunately otherwise the wound seems to be progressing quite nicely. 6/21; the patient's area on the left foot that we are currently casting is just about closed. The area on the right medial foot has a healthy looking surface but may be not changed as much from last week in terms of surface area. We are using silver alginate on the right and polymen Ag under the cast 6/28; only a small open area on the left foot remains. Using silver alginate under a total contact cast. She has a diabetic shoe I have asked her to bring that for next week where this may be closed. The area on the right foot with a Charcot deformity is also measuring slightly smaller. We are using PolyMem Ag here 7/6; left foot is totally closed. She does not require a total contact cast. She will graduate to her own diabetic shoe. ooThe area on the right foot with a Charcot deformity measuring about the same. We have been using polymen under compression wraps. She did not do well in this area with a total contact cast 7/13; left foot remains intact. She is skillfully covering this area with foam and using her diabetic shoe. Slight area improvement of the right foot wound. Still rolled senescent looking edges. We have been using polymen 7/19; left foot remains intact. Wound measuring slightly smaller on the right foot in the middle of the Charcot deformity. We have been using PolyMem Ag 7/26; per the patient left foot remains intact. The wound on the right foot looks slightly smaller but dimensions are measuring the same. We have been  using polymen Ag 8/9-Patient back after 2 weeks, wound of the right foot again looking slightly smaller, continuing to use PolyMem silver 8/16; patient has made some minor improvements in wound area on the right foot. Using polymen Ag. The left foot remains healed 8/23; continued slight improvement in dimensions. Rolled edges around the wound edge noted. We have been using polymen Ag. Patient offloading with a surgical shoe and crutches 8/30; no change in surface area. Thick rolled skin and subcutaneous tissue around the edges debris on the wound surface. We have been using polymen Ag initially with some improvement however stalled on the last 2 weeks in terms of surface area 9/13; surface area is smaller still using polymen Ag. Thick rolled skin and subcutaneous tissue around the edges requiring debridement. 9/20; again surface area is improved using polymen Ag however each week she develops thick rolled skin and subcutaneous tissue around the wound edges requiring debridement. She is offloading this using crutches 9/27; we've been using PolyMem AG. Wound does not look any different from last week. Raised edges of callus skin and subcutaneous tissue around the wound bed. 10/5; we been using polymen Ag. No ability to put a total contact cast on today in the clinic. Wound is about the same still rolled edges of thick callus around the margins Objective Constitutional Sitting or standing Blood Pressure is within target range for patient.. Pulse regular and within target range for patient.Marland Kitchen Respirations regular, non-labored and within target range.. Temperature is normal and within the target range for the patient.Marland Kitchen Appears in no distress. Vitals Time Taken: 9:53 AM, Height: 68 in, Weight: 265 lbs, BMI: 40.3, Temperature: 97.8 F, Pulse: 79 bpm, Respiratory Rate: 18 breaths/min, Blood Pressure: 130/77 mmHg. General Notes: Wound exam;  right medial foot plantar aspect in the setting of a Charcot  deformity in this area. I used a #5 curette to remove callus and skin from around the wound edge the wound itself looks and feels just fine. No need for subcutaneous debridement. Integumentary (Hair, Skin) Wound #9 status is Open. Original cause of wound was Blister. The wound is located on the Right,Medial Foot. The wound measures 1cm length x 1.2cm width x 0.3cm depth; 0.942cm^2 area and 0.283cm^3 volume. There is Fat Layer (Subcutaneous Tissue) exposed. There is no tunneling or undermining noted. There is a medium amount of serosanguineous drainage noted. The wound margin is thickened. There is large (67-100%) red, pink granulation within the wound bed. There is no necrotic tissue within the wound bed. Assessment Active Problems ICD-10 Type 2 diabetes mellitus with foot ulcer Non-pressure chronic ulcer of other part of right foot limited to breakdown of skin Type 2 diabetes mellitus with diabetic polyneuropathy Charcot's joint, right ankle and foot Procedures Wound #9 Pre-procedure diagnosis of Wound #9 is a Diabetic Wound/Ulcer of the Lower Extremity located on the Right,Medial Foot .Severity of Tissue Pre Debridement is: Fat layer exposed. There was a Selective/Open Wound Skin/Epidermis Debridement with a total area of 1.2 sq cm performed by [name], MD. With the following instrument(s): Curette to remove Non-Viable tissue/material. Material removed includes Callus, Skin: Dermis, and Skin: Epidermis after achieving pain control using Lidocaine 5% topical ointment. No specimens were taken. A time out was conducted at 10:52, prior to the start of the procedure. A Minimum amount of bleeding was controlled with Pressure. The procedure was tolerated well with a pain level of 0 throughout and a pain level of 0 following the procedure. Post Debridement Measurements: 1cm length x 1.2cm width x 0.3cm depth; 0.283cm^3 volume. Character of Wound/Ulcer Post Debridement is improved. Severity  of Tissue Post Debridement is: Fat layer exposed. Post procedure Diagnosis Wound #9: Same as Pre-Procedure Pre-procedure diagnosis of Wound #9 is a Diabetic Wound/Ulcer of the Lower Extremity located on the Right,Medial Foot . There was a Three Layer Compression Therapy Procedure by Yevonne Pax, RN. Post procedure Diagnosis Wound #9: Same as Pre-Procedure Plan Follow-up Appointments: Return Appointment in 1 week. Dressing Change Frequency: Wound #9 Right,Medial Foot: Do not change entire dressing for one week. Wound Cleansing: Wound #9 Right,Medial Foot: May shower with protection. - use cast protector Primary Wound Dressing: Wound #9 Right,Medial Foot: Polymem Silver Secondary Dressing: Wound #9 Right,Medial Foot: Foam - foam donut ABD pad Zetuvit or Kerramax Edema Control: 3 Layer Compression System - Right Lower Extremity Avoid standing for long periods of time Elevate legs to the level of the heart or above for 30 minutes daily and/or when sitting, a frequency of: - throughout the day Exercise regularly Off-Loading: Open toe surgical shoe to: - right foot 1. I am going to continue with the polymen Ag for now 2. I have talked to the patient once again about reapplication of the total contact cast I think the wound is different from what was the first go around with this. Trial of therapy would be 3 to 4 weeks. Starting next week hopefully Electronic Signature(s) Signed: 10/22/2019 5:35:49 PM By: Baltazar Najjar MD Entered By: Baltazar Najjar on 10/22/2019 11:15:54 -------------------------------------------------------------------------------- SuperBill Details Patient Name: Date of Service: [facility], Nikkia J. 10/22/2019 Medical Record Number: [id number] Patient Account Number: [id number] Date of Birth/Sex: Treating RN: 19-Jun-1955 (64 y.o. F) Epps, Carrie Primary Care Provider: MA SO UDI, ELHA MA LSA DA T  Other Clinician: Referring Provider: Treating  Provider/Extender: Baltazar Najjar MA SO UDI, ELHA MA LSA DA T Weeks in Treatment: 38 Diagnosis Coding ICD-10 Codes Code Description E11.621 Type 2 diabetes mellitus with foot ulcer L97.511 Non-pressure chronic ulcer of other part of right foot limited to breakdown of skin E11.42 Type 2 diabetes mellitus with diabetic polyneuropathy M14.671 Charcot's joint, right ankle and foot Facility Procedures CPT4 Code: [id number] Description: [phone] - DEBRIDE WOUND 1ST 20 SQ CM OR < ICD-10 Diagnosis Description L97.511 Non-pressure chronic ulcer of other part of right foot limited to breakdown of s Modifier: kin Quantity: 1 Physician Procedures : CPT4 Code Description Modifier [id number] - WC PHYS DEBR WO ANESTH 20 SQ CM ICD-10 Diagnosis Description L97.511 Non-pressure chronic ulcer of other part of right foot limited to breakdown of skin Quantity: 1 Electronic Signature(s) Signed: 10/22/2019 5:35:49 PM By: Baltazar Najjar MD Entered By: Baltazar Najjar on 10/22/2019 11:16:08

## 2019-10-22 NOTE — Progress Notes (Signed)
HARTLEY, BURY ([id number]) Visit Report for 10/22/2019 Arrival Information Details Patient Name: Date of Service: [name], [name] 10/22/2019 9:45 A M Medical Record Number: [id number] Patient Account Number: [id number] Date of Birth/Sex: Treating RN: 1955-10-17 (64 y.o. [name] Primary Care Spero Gunnels: MA SO UDI, [place] MA LSA DA T Other Clinician: Referring Taiwan Talcott: Treating Caliegh Middlekauff/Extender: Baltazar Najjar MA SO UDI, ELHA MA LSA DA T Weeks in Treatment: 45 Visit Information History Since Last Visit Added or deleted any medications: No Patient Arrived: Crutches Any new allergies or adverse reactions: No Arrival Time: 09:52 Had a fall or experienced change in No Accompanied By: alone activities of daily living that may affect Transfer Assistance: None risk of falls: Patient Identification Verified: Yes Signs or symptoms of abuse/neglect since last visito No Secondary Verification Process Completed: Yes Hospitalized since last visit: No Patient Requires Transmission-Based Precautions: No Implantable device outside of the clinic excluding No Patient Has Alerts: No cellular tissue based products placed in the [name] since last visit: Has Dressing in Place as Prescribed: Yes Has Compression in Place as Prescribed: Yes Pain Present Now: No Electronic Signature(s) Signed: 10/22/2019 5:47:16 PM By: Zandra Abts RN, BSN Entered By: Zandra Abts on 10/22/2019 09:53:08 -------------------------------------------------------------------------------- Compression Therapy Details Patient Name: Date of Service: [name], [name]. 10/22/2019 9:45 A M Medical Record Number: [id number] Patient Account Number: [id number] Date of Birth/Sex: Treating RN: [birth year] (64 y.o. [name] Primary Care Rodrigo Mcgranahan: MA SO UDI, [place] MA LSA DA T Other Clinician: Referring Jams Trickett: Treating Riggins Cisek/Extender: Baltazar Najjar MA SO UDI, ELHA MA LSA DA T Weeks in Treatment:  30 Compression Therapy Performed for Wound Assessment: Wound #9 Right,Medial Foot Performed By: Clinician Yevonne Pax, RN Compression Type: Three Layer Post Procedure Diagnosis Same as Pre-procedure Electronic Signature(s) Signed: 10/22/2019 5:35:40 PM By: Yevonne Pax RN Entered By: Yevonne Pax on 10/22/2019 10:57:04 -------------------------------------------------------------------------------- Encounter Discharge Information Details Patient Name: Date of Service: [name], [name]. 10/22/2019 9:45 A M Medical Record Number: [id number] Patient Account Number: [id number] Date of Birth/Sex: Treating RN: [birth year] (64 y.o. F) [name] Primary Care Garnell Begeman: MA SO UDI, [place] MA LSA DA T Other Clinician: Referring Zavion Sleight: Treating Julus Kelley/Extender: Baltazar Najjar MA SO UDI, ELHA MA LSA DA T Weeks in Treatment: 51 Encounter Discharge Information Items Post Procedure Vitals Discharge Condition: Stable Temperature (F): 97.8 Ambulatory Status: Crutches Pulse (bpm): 79 Discharge Destination: Home Respiratory Rate (breaths/min): 18 Transportation: Private Auto Blood Pressure (mmHg): 130/77 Accompanied By: self Schedule Follow-up Appointment: Yes Clinical Summary of Care: Patient Declined Electronic Signature(s) Signed: 10/22/2019 5:16:50 PM By: [name] Entered By: [name] on 10/22/2019 11:18:08 -------------------------------------------------------------------------------- Lower Extremity Assessment Details Patient Name: Date of Service: [name], [name]. 10/22/2019 9:45 A M Medical Record Number: [id number] Patient Account Number: [id number] Date of Birth/Sex: Treating RN: 1956/01/06 (64 y.o. [name] Primary Care Jet Armbrust: MA SO UDI, [place] MA LSA DA T Other Clinician: Referring Shaida Route: Treating Roxie Kreeger/Extender: Baltazar Najjar MA SO UDI, ELHA MA LSA DA T Weeks in Treatment: 38 Edema Assessment Assessed: [Left: No] [Right:  No] Edema: [Left: N] [Right: o] Calf Left: Right: Point of Measurement: 43 cm From Medial Instep 31 cm Ankle Left: Right: Point of Measurement: 12 cm From Medial Instep 21.2 cm Vascular Assessment Pulses: Dorsalis Pedis Palpable: [Right:Yes] Electronic Signature(s) Signed: 10/22/2019 5:47:16 PM By: Zandra Abts RN, BSN Entered By: Zandra Abts on 10/22/2019 09:57:19 -------------------------------------------------------------------------------- Multi Wound Chart Details Patient Name: Date of Service: [name], [name]. 10/22/2019 9:45 A  M Medical Record Number: [id number] Patient Account Number: [id number] Date of Birth/Sex: Treating RN: 1955/09/20 (64 y.o. F) Epps, Carrie Primary Care Lawrnce Reyez: MA SO UDI, [place] MA LSA DA T Other Clinician: Referring Sherrica Niehaus: Treating Sherryll Skoczylas/Extender: Baltazar Najjar MA SO UDI, ELHA MA LSA DA T Weeks in Treatment: 43 Vital Signs Height(in): 68 Pulse(bpm): 79 Weight(lbs): 265 Blood Pressure(mmHg): 130/77 Body Mass Index(BMI): 40 Temperature(F): 97.8 Respiratory Rate(breaths/min): 18 Photos: [9:No Photos Right, Medial Foot] [N/A:N/A N/A] Wound Location: [9:Blister] [N/A:N/A] Wounding Event: [9:Diabetic Wound/Ulcer of the Lower] [N/A:N/A] Primary Etiology: [9:Extremity Glaucoma, Chronic sinus] [N/A:N/A] Comorbid History: [9:problems/congestion, Anemia, Sleep Apnea, Hypertension, Type II Diabetes, Rheumatoid Arthritis, Neuropathy 06/18/2018] [N/A:N/A] Date Acquired: [9:38] [N/A:N/A] Weeks of Treatment: [9:Open] [N/A:N/A] Wound Status: [9:1x1.2x0.3] [N/A:N/A] Measurements L x W x D (cm) [9:0.942] [N/A:N/A] A (cm) : rea [9:0.283] [N/A:N/A] Volume (cm) : [9:91.40%] [N/A:N/A] % Reduction in A [9:rea: 97.10%] [N/A:N/A] % Reduction in Volume: [9:Grade 2] [N/A:N/A] Classification: [9:Medium] [N/A:N/A] Exudate A mount: [9:Serosanguineous] [N/A:N/A] Exudate Type: [9:red, brown] [N/A:N/A] Exudate Color: [9:Thickened] [N/A:N/A] Wound  Margin: [9:Large (67-100%)] [N/A:N/A] Granulation A mount: [9:Red, Pink] [N/A:N/A] Granulation Quality: [9:None Present (0%)] [N/A:N/A] Necrotic A mount: [9:Fat Layer (Subcutaneous Tissue): Yes N/A] Exposed Structures: [9:Fascia: No Tendon: No Muscle: No Joint: No Bone: No Medium (34-66%)] [N/A:N/A] Epithelialization: [9:Debridement - Selective/Open Wound N/A] Debridement: Pre-procedure Verification/Time Out 10:52 [N/A:N/A] Taken: [9:Lidocaine 5% topical ointment] [N/A:N/A] Pain Control: [9:Callus] [N/A:N/A] Tissue Debrided: [9:Skin/Epidermis] [N/A:N/A] Level: [9:1.2] [N/A:N/A] Debridement A (sq cm): [9:rea Curette] [N/A:N/A] Instrument: [9:Minimum] [N/A:N/A] Bleeding: [9:Pressure] [N/A:N/A] Hemostasis A chieved: [9:0] [N/A:N/A] Procedural Pain: [9:0] [N/A:N/A] Post Procedural Pain: [9:Procedure was tolerated well] [N/A:N/A] Debridement Treatment Response: [9:1x1.2x0.3] [N/A:N/A] Post Debridement Measurements L x W x D (cm) [9:0.283] [N/A:N/A] Post Debridement Volume: (cm) [9:Compression Therapy] [N/A:N/A] Procedures Performed: [9:Debridement] Treatment Notes Electronic Signature(s) Signed: 10/22/2019 5:35:40 PM By: Yevonne Pax RN Signed: 10/22/2019 5:35:49 PM By: Baltazar Najjar MD Entered By: Baltazar Najjar on 10/22/2019 11:12:52 -------------------------------------------------------------------------------- Multi-Disciplinary Care Plan Details Patient Name: Date of Service: [name], [name]. 10/22/2019 9:45 A M Medical Record Number: [id number] Patient Account Number: [id number] Date of Birth/Sex: Treating RN: 12-09-1955 (64 y.o. [name] Primary Care Johnie Makki: MA SO UDI, [place] MA LSA DA T Other Clinician: Referring Laquia Rosano: Treating Tula Schryver/Extender: Baltazar Najjar MA SO UDI, ELHA MA LSA DA T Weeks in Treatment: 81 Active Inactive Wound/Skin Impairment Nursing Diagnoses: Impaired tissue integrity Knowledge deficit related to ulceration/compromised skin  integrity Goals: Patient/caregiver will verbalize understanding of skin care regimen Date Initiated: 01/28/2019 Target Resolution Date: 11/01/2019 Goal Status: Active Interventions: Assess patient/caregiver ability to obtain necessary supplies Assess patient/caregiver ability to perform ulcer/skin care regimen upon admission and as needed Assess ulceration(s) every visit Provide education on ulcer and skin care Notes: Electronic Signature(s) Signed: 10/22/2019 5:35:40 PM By: Yevonne Pax RN Entered By: Yevonne Pax on 10/22/2019 10:26:59 -------------------------------------------------------------------------------- Pain Assessment Details Patient Name: Date of Service: [name], [name]. 10/22/2019 9:45 A M Medical Record Number: [id number] Patient Account Number: [id number] Date of Birth/Sex: Treating RN: [birth year] (64 y.o. [name] Primary Care Krystalyn Kubota: MA SO UDI, [place] MA LSA DA T Other Clinician: Referring Ky Rumple: Treating Adrean Heitz/Extender: Baltazar Najjar MA SO UDI, ELHA MA LSA DA T Weeks in Treatment: 63 Active Problems Location of Pain Severity and Description of Pain Patient Has Paino No Site Locations Pain Management and Medication Current Pain Management: Electronic Signature(s) Signed: 10/22/2019 5:47:16 PM By: Zandra Abts RN, BSN Entered By: Zandra Abts on 10/22/2019 09:54:43 -------------------------------------------------------------------------------- Patient/Caregiver Education Details Patient Name: Date  of Service: [name], [name] 10/5/2021andnbsp9:45 A M Medical Record Number: [id number] Patient Account Number: [id number] Date of Birth/Gender: Treating RN: [birth year] (64 y.o. [name] Primary Care Physician: MA SO UDI, Judene Companion MA LSA DA T Other Clinician: Referring Physician: Treating Physician/Extender: Baltazar Najjar MA SO UDI, ELHA MA LSA DA T Weeks in Treatment: 82 Education Assessment Education Provided  To: Patient Education Topics Provided Wound/Skin Impairment: Methods: Explain/Verbal Responses: State content correctly Electronic Signature(s) Signed: 10/22/2019 5:35:40 PM By: Yevonne Pax RN Entered By: Yevonne Pax on 10/22/2019 10:27:14 -------------------------------------------------------------------------------- Wound Assessment Details Patient Name: Date of Service: [name], [name]. 10/22/2019 9:45 A M Medical Record Number: [id number] Patient Account Number: [id number] Date of Birth/Sex: Treating RN: 1955-06-20 (64 y.o. [name] Primary Care Kinisha Soper: MA SO UDI, [place] MA LSA DA T Other Clinician: Referring Ayo Smoak: Treating Petrina Melby/Extender: Baltazar Najjar MA SO UDI, ELHA MA LSA DA T Weeks in Treatment: 38 Wound Status Wound Number: 9 Primary Diabetic Wound/Ulcer of the Lower Extremity Etiology: Wound Location: Right, Medial Foot Wound Open Wounding Event: Blister Status: Date Acquired: 06/18/2018 Comorbid Glaucoma, Chronic sinus problems/congestion, Anemia, Sleep Weeks Of Treatment: 38 History: Apnea, Hypertension, Type II Diabetes, Rheumatoid Arthritis, Clustered Wound: No Neuropathy Wound Measurements Length: (cm) 1 Width: (cm) 1.2 Depth: (cm) 0.3 Area: (cm) 0.942 Volume: (cm) 0.283 % Reduction in Area: 91.4% % Reduction in Volume: 97.1% Epithelialization: Medium (34-66%) Tunneling: No Undermining: No Wound Description Classification: Grade 2 Wound Margin: Thickened Exudate Amount: Medium Exudate Type: Serosanguineous Exudate Color: red, brown Foul Odor After Cleansing: No Slough/Fibrino No Wound Bed Granulation Amount: Large (67-100%) Exposed Structure Granulation Quality: Red, Pink Fascia Exposed: No Necrotic Amount: None Present (0%) Fat Layer (Subcutaneous Tissue) Exposed: Yes Tendon Exposed: No Muscle Exposed: No Joint Exposed: No Bone Exposed: No Treatment Notes Wound #9 (Right, Medial Foot) 1. Cleanse With Wound  Cleanser Soap and water 2. Periwound Care Moisturizing lotion 3. Primary Dressing Applied Polymem Ag 4. Secondary Dressing ABD Pad Dry Gauze Foam 6. Support Layer Applied 3 layer compression wrap Notes foam donut as secondary, stockinette. Electronic Signature(s) Signed: 10/22/2019 5:47:16 PM By: Zandra Abts RN, BSN Entered By: Zandra Abts on 10/22/2019 10:02:01 -------------------------------------------------------------------------------- Vitals Details Patient Name: Date of Service: [name], [name]. 10/22/2019 9:45 A M Medical Record Number: [id number] Patient Account Number: [id number] Date of Birth/Sex: Treating RN: 06-28-1955 (64 y.o. [name] Primary Care Naisha Wisdom: MA SO UDI, [place] MA LSA DA T Other Clinician: Referring Chyan Carnero: Treating Sharona Rovner/Extender: Baltazar Najjar MA SO UDI, ELHA MA LSA DA T Weeks in Treatment: 27 Vital Signs Time Taken: 09:53 Temperature (F): 97.8 Height (in): 68 Pulse (bpm): 79 Weight (lbs): 265 Respiratory Rate (breaths/min): 18 Body Mass Index (BMI): 40.3 Blood Pressure (mmHg): 130/77 Reference Range: 80 - 120 mg / dl Electronic Signature(s) Signed: 10/22/2019 5:47:16 PM By: Zandra Abts RN, BSN Entered By: Zandra Abts on 10/22/2019 09:54:37

## 2019-10-28 ENCOUNTER — Other Ambulatory Visit: Payer: Self-pay

## 2019-10-28 ENCOUNTER — Encounter (HOSPITAL_BASED_OUTPATIENT_CLINIC_OR_DEPARTMENT_OTHER): Payer: Medicare Other | Admitting: Internal Medicine

## 2019-10-28 DIAGNOSIS — E11621 Type 2 diabetes mellitus with foot ulcer: Secondary | ICD-10-CM | POA: Diagnosis not present

## 2019-10-28 NOTE — Progress Notes (Signed)
[name] [name], [name] [name] ([id number]) Visit Report for 10/28/2019 Debridement Details Patient Name: Date of Service: [name] [name] [name] 10/28/2019 1:45 PM Medical Record Number: [id number] Patient Account Number: [id number] Date of Birth/Sex: Treating RN: 11-Nov-1955 (64 y.o. [name] [name] Primary Care Provider: MA SO UDI, [place] MA LSA DA T Other Clinician: Referring Provider: Treating Provider/Extender: Abe People, ELHA MA LSA DA T Weeks in Treatment: 39 Debridement Performed for Assessment: Wound #9 Right,Medial Foot Performed By: Physician [name], MD Debridement Type: Debridement Severity of Tissue Pre Debridement: Fat layer exposed Level of Consciousness (Pre-procedure): Awake and Alert Pre-procedure Verification/Time Out Yes - 14:30 Taken: Start Time: 14:30 T Area Debrided (L x W): otal 1.2 (cm) x 1.3 (cm) = 1.56 (cm) Tissue and other material debrided: Viable, Non-Viable, Callus, Subcutaneous Level: Skin/Subcutaneous Tissue Debridement Description: Excisional Instrument: Curette Bleeding: Moderate Hemostasis Achieved: Silver Nitrate End Time: 14:31 Procedural Pain: 0 Post Procedural Pain: 0 Response to Treatment: Procedure was tolerated well Level of Consciousness (Post- Awake and Alert procedure): Post Debridement Measurements of Total Wound Length: (cm) 1.2 Width: (cm) 1.3 Depth: (cm) 0.3 Volume: (cm) 0.368 Character of Wound/Ulcer Post Debridement: Improved Severity of Tissue Post Debridement: Fat layer exposed Post Procedure Diagnosis Same as Pre-procedure Electronic Signature(s) Signed: 10/28/2019 5:03:49 PM By: Baltazar Najjar MD Signed: 10/28/2019 5:10:40 PM By: Zandra Abts RN, BSN Entered By: Baltazar Najjar on 10/28/2019 15:00:08 -------------------------------------------------------------------------------- Physical Exam Details Patient Name: Date of Service: [name] [name], Winona J. 10/28/2019 1:45 PM Medical Record Number:  [id number] Patient Account Number: [id number] Date of Birth/Sex: Treating RN: 04-06-1955 (64 y.o. [name] [name] Primary Care Provider: MA SO UDI, [place] MA LSA DA T Other Clinician: Referring Provider: Treating Provider/Extender: Baltazar Najjar MA SO UDI, ELHA MA LSA DA T Weeks in Treatment: 39 Notes Wound exam; right medial plantar foot in the setting of Charcot deformity. Rolled edges senescent wound. I used [name] #5 curette to remove the rolled edges and debris on the surface. Hemostasis with silver nitrate we then applied [name] total contact cast. Silver collagen is the primary dressing Electronic Signature(s) Signed: 10/28/2019 5:03:49 PM By: Baltazar Najjar MD Entered By: Baltazar Najjar on 10/28/2019 15:03:03 -------------------------------------------------------------------------------- Physician Orders Details Patient Name: Date of Service: [name] [name], [name] J. 10/28/2019 1:45 PM Medical Record Number: [id number] Patient Account Number: [id number] Date of Birth/Sex: Treating RN: Apr 23, 1955 (64 y.o. 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 Dressing Change Frequency Wound #9 Right,Medial Foot Do not change entire dressing for one week. Wound Cleansing Wound #9 Right,Medial Foot May shower with protection. - use cast protector Primary Wound Dressing Wound #9 Right,Medial Foot Silver Collagen - moisten with hydrogel Secondary Dressing Wound #9  Right,Medial Foot Kerlix/Rolled Gauze Dry Gauze Drawtex Edema Control Avoid standing for long periods of time Elevate legs to the level of the heart or above for 30 minutes daily and/or when sitting, [name] frequency of: - throughout the day Exercise regularly Off-Loading Total Contact Cast to Right Lower Extremity Electronic Signature(s) Signed: 10/28/2019 5:03:49 PM  By: Baltazar Najjar MD Signed: 10/28/2019 5:10:40 PM By: Zandra Abts RN, BSN Entered By: Zandra Abts on 10/28/2019 14:34:32 -------------------------------------------------------------------------------- Problem List Details Patient Name: Date of Service: [name] [name], [name] J. 10/28/2019 1:45 PM Medical Record Number: [id number] Patient Account Number: [id number] Date of Birth/Sex: Treating RN: [birth year] (64 y.o. [name] [name] Primary Care Provider: MA SO UDI, Judene Companion MA LSA DA T Other Clinician: Referring Provider: Treating Provider/Extender: Baltazar Najjar MA SO UDI, ELHA MA LSA DA T Weeks in Treatment: 60 Active Problems ICD-10 Encounter Code Description Active Date MDM Diagnosis E11.621 Type 2 diabetes mellitus with foot ulcer 01/28/2019 No Yes L97.511 Non-pressure chronic ulcer of other part of right foot limited to breakdown of 01/28/2019 No Yes skin E11.42 Type 2 diabetes mellitus with diabetic polyneuropathy 01/28/2019 No Yes M14.671 Charcot's joint, right ankle and foot 01/28/2019 No Yes Inactive Problems ICD-10 Code Description Active Date Inactive Date L97.524 Non-pressure chronic ulcer of other part of left foot with necrosis of bone 01/28/2019 01/28/2019 L97.514 Non-pressure chronic ulcer of other part of right foot with necrosis of bone 01/28/2019 01/28/2019 R42.706 Charcot's joint, left ankle and foot 01/28/2019 01/28/2019 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 10/28/2019 1:45 PM Medical Record Number: [id number] Patient Account Number: [id number] Date of Birth/Sex: Treating RN: 09-27-1955 (64 y.o. [name] [name] Primary Care Provider: MA SO UDI, [place] MA LSA DA T Other Clinician: Referring Provider: Treating Provider/Extender: Baltazar Najjar MA SO UDI, ELHA MA LSA DA T Weeks in Treatment: 39 Subjective Objective Constitutional Vitals Time Taken: 1:46 PM, Height: 68 in, Weight: 265 lbs, BMI: 40.3, Temperature: 98.3 F, Pulse: 75 bpm, Respiratory Rate: 18 breaths/min, Blood Pressure: 153/84 mmHg. Integumentary (Hair, Skin) Wound #9 status is Open. Original cause of wound was Blister. The wound is located on the Right,Medial Foot. The wound measures 1.2cm length x 1.3cm width x 0.3cm depth; 1.225cm^2 area and 0.368cm^3 volume. There is Fat Layer (Subcutaneous Tissue) exposed. There is no tunneling or undermining noted. There is [name] medium amount of serosanguineous drainage noted. The wound margin is thickened. There is large (67-100%) red, pink granulation within the wound bed. There is no necrotic tissue within the wound bed. 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 There was [name] Excisional Skin/Subcutaneous Tissue Debridement with [name] total area of 1.56 sq cm performed by [name], MD. With the following instrument(s):  Curette to remove Viable and Non-Viable tissue/material. Material removed includes Callus and Subcutaneous Tissue and. No specimens were taken. [name] time out was conducted at 14:30, prior to the start of the procedure. [name] Moderate amount of bleeding was controlled with Silver Nitrate. The procedure was tolerated well with [name] pain level of 0 throughout and [name] pain level of 0 following the procedure. Post Debridement Measurements: 1.2cm length x 1.3cm width x 0.3cm depth; 0.368cm^3 volume. Character of Wound/Ulcer Post Debridement is improved. Severity of Tissue Post Debridement is: Fat layer exposed. Post procedure Diagnosis Wound #9:  Same as Pre-Procedure Pre-procedure diagnosis of Wound #9 is [name] Diabetic Wound/Ulcer of the Lower Extremity located on the Right,Medial Foot . There was [name] T Contact Cast otal Procedure by [name], MD. Post procedure Diagnosis Wound #9: Same as Pre-Procedure Plan Follow-up Appointments: Return Appointment in 1 week. Dressing Change Frequency: Wound #9 Right,Medial Foot: Do not change entire dressing for one week. Wound Cleansing: Wound #9 Right,Medial Foot: May shower with protection. - use cast protector Primary Wound Dressing: Wound #9 Right,Medial Foot: Silver Collagen - moisten with hydrogel Secondary Dressing: Wound #9 Right,Medial Foot: Kerlix/Rolled Gauze Dry Gauze Drawtex Edema Control: Avoid standing for long periods of time Elevate legs to the level of the heart or above for 30 minutes daily and/or when sitting, [name] frequency of: - throughout the day Exercise regularly Off-Loading: T Contact Cast to Right Lower Extremity otal Wound exam; silver collagen under [name] total contact cast 2. We had tried [name] total contact cast previously on this wound however the wound was larger with [name] lot more drainage I am hoping that it will turn the corner this time Electronic Signature(s) Signed: 10/28/2019 5:03:49 PM By: Baltazar Najjar MD Entered By:  Baltazar Najjar on 10/28/2019 15:03:37 -------------------------------------------------------------------------------- Total Contact Cast Details Patient Name: Date of Service: [name] [name], [name] J. 10/28/2019 1:45 PM Medical Record Number: [id number] Patient Account Number: [id number] Date of Birth/Sex: Treating RN: January 02, 1956 (64 y.o. [name] [name] Primary Care Provider: MA SO UDI, Judene Companion MA LSA DA T Other Clinician: Referring Provider: Treating Provider/Extender: Baltazar Najjar MA SO UDI, ELHA MA LSA DA T Weeks in Treatment: 71 T Contact Cast Applied for Wound Assessment: otal Wound #9 Right,Medial Foot Performed By: Physician [name], MD Post Procedure Diagnosis Same as Pre-procedure Electronic Signature(s) Signed: 10/28/2019 5:03:49 PM By: Baltazar Najjar MD Entered By: Baltazar Najjar on 10/28/2019 15:00:16 -------------------------------------------------------------------------------- SuperBill Details Patient Name: Date of Service: [name] [name], [name] J. 10/28/2019 Medical Record Number: [id number] Patient Account Number: [id number] Date of Birth/Sex: Treating RN: May 01, 1955 (64 y.o. 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 M14.671 Charcot's joint, right ankle and foot Facility Procedures CPT4 Code: [id number] I Description: 11042 - DEB SUBQ TISSUE 20 SQ CM/< CD-10 Diagnosis Description E11.621 Type 2 diabetes mellitus with foot ulcer L97.511 Non-pressure chronic ulcer of other part of right foot limited to breakdown of skin Modifier: Quantity: 1 Physician  Procedures : CPT4 Code Description Modifier [id number] 11042 - WC PHYS SUBQ TISS 20 SQ CM ICD-10 Diagnosis Description E11.621 Type 2 diabetes mellitus with foot ulcer L97.511 Non-pressure chronic ulcer of other part of right foot limited to breakdown of skin Quantity: 1 Electronic Signature(s) Signed: 10/28/2019 5:03:49 PM By: Baltazar Najjar MD Entered By: Baltazar Najjar on 10/28/2019 15:03:55

## 2019-10-29 NOTE — Progress Notes (Signed)
ESTHEFANY, HERRIG ([id number]) Visit Report for 10/28/2019 Arrival Information Details Patient Name: Date of Service: [name], [name] 10/28/2019 1:45 PM Medical Record Number: [id number] Patient Account Number: [id number] Date of Birth/Sex: Treating RN: 1955-03-23 (64 y.o. [name] Primary Care Saba Gomm: MA SO UDI, [place] MA LSA DA T Other Clinician: Referring Thedford Bunton: Treating Marylu Dudenhoeffer/Extender: Baltazar Najjar MA SO UDI, ELHA MA LSA DA T Weeks in Treatment: 54 Visit Information History Since Last Visit Added or deleted any medications: No Patient Arrived: Crutches Any new allergies or adverse reactions: No Arrival Time: 13:44 Had a fall or experienced change in No Accompanied By: self activities of daily living that may affect Transfer Assistance: None risk of falls: Patient Identification Verified: Yes Signs or symptoms of abuse/neglect since last visito No Secondary Verification Process Completed: Yes Hospitalized since last visit: No Patient Requires Transmission-Based Precautions: No Implantable device outside of the clinic excluding No Patient Has Alerts: No cellular tissue based products placed in the center since last visit: Has Dressing in Place as Prescribed: Yes Pain Present Now: No Electronic Signature(s) Signed: 10/29/2019 9:19:13 AM By: Karl Ito Entered By: Karl Ito on 10/28/2019 13:46:22 -------------------------------------------------------------------------------- Encounter Discharge Information Details Patient Name: Date of Service: [name], [name] 10/28/2019 1:45 PM Medical Record Number: [id number] Patient Account Number: [id number] Date of Birth/Sex: Treating RN: 01/30/1955 (64 y.o. [name] Primary Care Aylyn Wenzler: MA SO UDI, [place] MA LSA DA T Other Clinician: Referring Liliane Mallis: Treating Corry Storie/Extender: Baltazar Najjar MA SO UDI, ELHA MA LSA DA T Weeks in Treatment: 38 Encounter Discharge Information Items  Post Procedure Vitals Discharge Condition: Stable Temperature (F): 98.3 Ambulatory Status: Crutches Pulse (bpm): 75 Discharge Destination: Home Respiratory Rate (breaths/min): 18 Transportation: Private Auto Blood Pressure (mmHg): 153/84 Accompanied By: self Schedule Follow-up Appointment: Yes Clinical Summary of Care: Electronic Signature(s) Signed: 10/28/2019 5:08:17 PM By: Shawn Stall Entered By: Shawn Stall on 10/28/2019 15:00:52 -------------------------------------------------------------------------------- Lower Extremity Assessment Details Patient Name: Date of Service: [name], [name] 10/28/2019 1:45 PM Medical Record Number: [id number] Patient Account Number: [id number] Date of Birth/Sex: Treating RN: 11/08/1955 (64 y.o. F) [name] Primary Care Trindon Dorton: MA SO UDI, [place] MA LSA DA T Other Clinician: Referring Jamall Strohmeier: Treating Stephaie Dardis/Extender: Baltazar Najjar MA SO UDI, ELHA MA LSA DA T Weeks in Treatment: 39 Edema Assessment Assessed: [Left: No] [Right: No] Edema: [Left: N] [Right: o] Calf Left: Right: Point of Measurement: 43 cm From Medial Instep 30.5 cm Ankle Left: Right: Point of Measurement: 12 cm From Medial Instep 21.2 cm Vascular Assessment Pulses: Dorsalis Pedis Palpable: [Right:Yes] Electronic Signature(s) Signed: 10/28/2019 5:18:16 PM By: [name] Entered By: [name] on 10/28/2019 13:51:47 -------------------------------------------------------------------------------- Multi Wound Chart Details Patient Name: Date of Service: [name], [name] 10/28/2019 1:45 PM Medical Record Number: [id number] Patient Account Number: [id number] Date of Birth/Sex: Treating RN: 11-22-1955 (64 y.o. [name] Primary Care Aariana Shankland: MA SO UDI, Judene Companion MA LSA DA T Other Clinician: Referring Auryn Paige: Treating Chavis Tessler/Extender: Baltazar Najjar MA SO UDI, ELHA MA LSA DA T Weeks in Treatment: 54 Vital  Signs Height(in): 68 Pulse(bpm): 75 Weight(lbs): 265 Blood Pressure(mmHg): 153/84 Body Mass Index(BMI): 40 Temperature(F): 98.3 Respiratory Rate(breaths/min): 18 Photos: [9:No Photos Right, Medial Foot] [N/A:N/A N/A] Wound Location: [9:Blister] [N/A:N/A] Wounding Event: [9:Diabetic Wound/Ulcer of the Lower] [N/A:N/A] Primary Etiology: [9:Extremity Glaucoma, Chronic sinus] [N/A:N/A] Comorbid History: [9:problems/congestion, Anemia, Sleep Apnea, Hypertension, Type II Diabetes, Rheumatoid Arthritis, Neuropathy 06/18/2018] [N/A:N/A] Date Acquired: [9:39] [N/A:N/A] Weeks of Treatment: [9:Open] [N/A:N/A] Wound Status: [9:1.2x1.3x0.3] [N/A:N/A] Measurements  L x W x D (cm) [9:1.225] [N/A:N/A] A (cm) : rea [9:0.368] [N/A:N/A] Volume (cm) : [9:88.90%] [N/A:N/A] % Reduction in A rea: [9:96.30%] [N/A:N/A] % Reduction in Volume: [9:Grade 2] [N/A:N/A] Classification: [9:Medium] [N/A:N/A] Exudate A mount: [9:Serosanguineous] [N/A:N/A] Exudate Type: [9:red, brown] [N/A:N/A] Exudate Color: [9:Thickened] [N/A:N/A] Wound Margin: [9:Large (67-100%)] [N/A:N/A] Granulation A mount: [9:Red, Pink] [N/A:N/A] Granulation Quality: [9:None Present (0%)] [N/A:N/A] Necrotic A mount: [9:Fat Layer (Subcutaneous Tissue): Yes N/A] Exposed Structures: [9:Fascia: No Tendon: No Muscle: No Joint: No Bone: No Medium (34-66%)] [N/A:N/A] Epithelialization: [9:Debridement - Excisional] [N/A:N/A] Debridement: Pre-procedure Verification/Time Out 14:30 [N/A:N/A] Taken: [9:Callus, Subcutaneous] [N/A:N/A] Tissue Debrided: [9:Skin/Subcutaneous Tissue] [N/A:N/A] Level: [9:1.56] [N/A:N/A] Debridement A (sq cm): [9:rea Curette] [N/A:N/A] Instrument: [9:Moderate] [N/A:N/A] Bleeding: [9:Silver Nitrate] [N/A:N/A] Hemostasis A chieved: [9:0] [N/A:N/A] Procedural Pain: [9:0] [N/A:N/A] Post Procedural Pain: [9:Procedure was tolerated well] [N/A:N/A] Debridement Treatment Response: [9:1.2x1.3x0.3] [N/A:N/A] Post Debridement  Measurements L x W x D (cm) [9:0.368] [N/A:N/A] Post Debridement Volume: (cm) [9:Debridement] [N/A:N/A] Procedures Performed: [9:T Contact Cast otal] Treatment Notes Electronic Signature(s) Signed: 10/28/2019 5:03:49 PM By: Baltazar Najjar MD Signed: 10/28/2019 5:10:40 PM By: Zandra Abts RN, BSN Entered By: Baltazar Najjar on 10/28/2019 14:59:41 -------------------------------------------------------------------------------- Multi-Disciplinary Care Plan Details Patient Name: Date of Service: [name], [name] 10/28/2019 1:45 PM Medical Record Number: [id number] Patient Account Number: [id number] Date of Birth/Sex: Treating RN: Aug 26, 1955 (64 y.o. [name] Primary Care Draydon Clairmont: MA SO UDI, Judene Companion MA LSA DA T Other Clinician: Referring Pepper Wyndham: Treating Ayodeji Keimig/Extender: Baltazar Najjar MA SO UDI, ELHA MA LSA DA T Weeks in Treatment: 57 Active Inactive Wound/Skin Impairment Nursing Diagnoses: Impaired tissue integrity Knowledge deficit related to ulceration/compromised skin integrity Goals: Patient/caregiver will verbalize understanding of skin care regimen Date Initiated: 01/28/2019 Target Resolution Date: 11/29/2019 Goal Status: Active Interventions: Assess patient/caregiver ability to obtain necessary supplies Assess patient/caregiver ability to perform ulcer/skin care regimen upon admission and as needed Assess ulceration(s) every visit Provide education on ulcer and skin care Notes: Electronic Signature(s) Signed: 10/28/2019 5:10:40 PM By: Zandra Abts RN, BSN Entered By: Zandra Abts on 10/28/2019 14:37:11 -------------------------------------------------------------------------------- Pain Assessment Details Patient Name: Date of Service: [name], [name] 10/28/2019 1:45 PM Medical Record Number: [id number] Patient Account Number: [id number] Date of Birth/Sex: Treating RN: 1955-09-30 (64 y.o. [name] Primary Care Lira Stephen: MA SO  UDI, [place] MA LSA DA T Other Clinician: Referring Nasire Reali: Treating Yamil Dougher/Extender: Baltazar Najjar MA SO UDI, ELHA MA LSA DA T Weeks in Treatment: 26 Active Problems Location of Pain Severity and Description of Pain Patient Has Paino No Site Locations Pain Management and Medication Current Pain Management: Electronic Signature(s) Signed: 10/28/2019 5:10:40 PM By: Zandra Abts RN, BSN Signed: 10/29/2019 9:19:13 AM By: Karl Ito Entered By: Karl Ito on 10/28/2019 13:46:48 -------------------------------------------------------------------------------- Patient/Caregiver Education Details Patient Name: Date of Service: [name] 10/11/2021andnbsp1:45 PM Medical Record Number: [id number] Patient Account Number: [id number] Date of Birth/Gender: Treating RN: May 10, 1955 (64 y.o. [name] Primary Care Physician: MA SO UDI, Judene Companion MA LSA DA T Other Clinician: Referring Physician: Treating Physician/Extender: Baltazar Najjar MA SO UDI, ELHA MA LSA DA T Weeks in Treatment: 56 Education Assessment Education Provided To: Patient Education Topics Provided Wound/Skin Impairment: Methods: Explain/Verbal Responses: State content correctly [employer]) Signed: 10/28/2019 5:10:40 PM By: Zandra Abts RN, BSN Entered By: Zandra Abts on 10/28/2019 14:45:33 -------------------------------------------------------------------------------- Wound Assessment Details Patient Name: Date of Service: [name], [name] 10/28/2019 1:45 PM Medical Record Number: [id number] Patient Account Number: [id number] Date of Birth/Sex: Treating RN: 1955/07/18 (  64 y.o. [name] Primary Care Naliah Eddington: MA SO UDI, [place] MA LSA DA T Other Clinician: Referring Talani Brazee: Treating Zyasia Halbleib/Extender: Baltazar Najjar MA SO UDI, ELHA MA LSA DA T Weeks in Treatment: 39 Wound Status Wound Number: 9 Primary Diabetic Wound/Ulcer of the Lower  Extremity Etiology: Wound Location: Right, Medial Foot Wound Open Wounding Event: Blister Status: Date Acquired: 06/18/2018 Comorbid Glaucoma, Chronic sinus problems/congestion, Anemia, Sleep Weeks Of Treatment: 39 History: Apnea, Hypertension, Type II Diabetes, Rheumatoid Arthritis, Clustered Wound: No Neuropathy Wound Measurements Length: (cm) 1.2 Width: (cm) 1.3 Depth: (cm) 0.3 Area: (cm) 1.225 Volume: (cm) 0.368 % Reduction in Area: 88.9% % Reduction in Volume: 96.3% Epithelialization: Medium (34-66%) Tunneling: No Undermining: No Wound Description Classification: Grade 2 Wound Margin: Thickened Exudate Amount: Medium Exudate Type: Serosanguineous Exudate Color: red, brown Foul Odor After Cleansing: No Slough/Fibrino No Wound Bed Granulation Amount: Large (67-100%) Exposed Structure Granulation Quality: Red, Pink Fascia Exposed: No Necrotic Amount: None Present (0%) Fat Layer (Subcutaneous Tissue) Exposed: Yes Tendon Exposed: No Muscle Exposed: No Joint Exposed: No Bone Exposed: No Treatment Notes Wound #9 (Right, Medial Foot) 1. Cleanse With Wound Cleanser Soap and water 3. Primary Dressing Applied Collegen AG Hydrogel or K-Y Jelly 4. Secondary Dressing ABD Pad Dry Gauze Roll Gauze Drawtex 5. Secured With Tape 7. Footwear/Offloading device applied T Contact Cast otal Notes TCC applied by MD. Electronic Signature(s) Signed: 10/28/2019 5:10:40 PM By: Zandra Abts RN, BSN Signed: 10/28/2019 5:18:16 PM By: [name] Entered By: [name] on 10/28/2019 13:52:07 -------------------------------------------------------------------------------- Vitals Details Patient Name: Date of Service: [name], [name] 10/28/2019 1:45 PM Medical Record Number: [id number] Patient Account Number: [id number] Date of Birth/Sex: Treating RN: [birth year] (64 y.o. [name] Primary Care Martine Bleecker: MA SO UDI, [place] MA LSA DA T Other  Clinician: Referring Jhaniya Briski: Treating Tammi Boulier/Extender: Baltazar Najjar MA SO UDI, ELHA MA LSA DA T Weeks in Treatment: 53 Vital Signs Time Taken: 13:46 Temperature (F): 98.3 Height (in): 68 Pulse (bpm): 75 Weight (lbs): 265 Respiratory Rate (breaths/min): 18 Body Mass Index (BMI): 40.3 Blood Pressure (mmHg): 153/84 Reference Range: 80 - 120 mg / dl Electronic Signature(s) Signed: 10/29/2019 9:19:13 AM By: Karl Ito Entered By: Karl Ito on 10/28/2019 13:46:41

## 2019-11-04 ENCOUNTER — Encounter (HOSPITAL_BASED_OUTPATIENT_CLINIC_OR_DEPARTMENT_OTHER): Payer: Medicare Other | Admitting: Internal Medicine

## 2019-11-04 ENCOUNTER — Other Ambulatory Visit (HOSPITAL_COMMUNITY)
Admission: RE | Admit: 2019-11-04 | Discharge: 2019-11-04 | Disposition: A | Discharge status: Home / Self Care | Payer: Medicare Other | Source: Other Acute Inpatient Hospital | Attending: Internal Medicine | Admitting: Internal Medicine

## 2019-11-04 ENCOUNTER — Other Ambulatory Visit: Payer: Self-pay

## 2019-11-04 DIAGNOSIS — E11621 Type 2 diabetes mellitus with foot ulcer: Secondary | ICD-10-CM | POA: Insufficient documentation

## 2019-11-05 NOTE — Progress Notes (Signed)
MADINA, LAGUE ([id number]) Visit Report for 11/04/2019 Arrival Information Details Patient Name: Date of Service: CALEA, HEMPSTEAD 11/04/2019 1:45 PM Medical Record Number: [id number] Patient Account Number: [id number] Date of Birth/Sex: Treating RN: 10-26-1955 (64 y.o. [name] Primary Care Manson Luckadoo: MA SO UDI, Harlan MA LSA DA T Other Clinician: Referring Amberli Ruegg: Treating Karista Aispuro/Extender: Baltazar Najjar MA SO UDI, ELHA MA LSA DA T Weeks in Treatment: 40 Visit Information History Since Last Visit Added or deleted any medications: No Patient Arrived: Crutches Any new allergies or adverse reactions: No Arrival Time: 14:01 Had a fall or experienced change in No Accompanied By: self activities of daily living that may affect Transfer Assistance: None risk of falls: Patient Identification Verified: Yes Signs or symptoms of abuse/neglect since last visito No Secondary Verification Process Completed: Yes Hospitalized since last visit: No Patient Requires Transmission-Based Precautions: No Implantable device outside of the clinic excluding No Patient Has Alerts: No cellular tissue based products placed in the center since last visit: Has Dressing in Place as Prescribed: Yes Has Footwear/Offloading in Place as Prescribed: Yes Right: T Contact Cast otal Pain Present Now: No Electronic Signature(s) Signed: 11/04/2019 5:10:38 PM By: Zenaida Deed RN, BSN Entered By: Zenaida Deed on 11/04/2019 14:01:30 -------------------------------------------------------------------------------- Compression Therapy Details Patient Name: Date of Service: [name], Rukia J. 11/04/2019 1:45 PM Medical Record Number: [id number] Patient Account Number: [id number] Date of Birth/Sex: Treating RN: [birth year] (64 y.o. [name] Primary Care Nicholaos Schippers: MA SO UDI, Judene Companion MA LSA DA T Other Clinician: Referring Hajer Dwyer: Treating Linn Goetze/Extender: Baltazar Najjar MA SO UDI,  ELHA MA LSA DA T Weeks in Treatment: 40 Compression Therapy Performed for Wound Assessment: Wound #12 Right,Medial Malleolus Performed By: Clinician Zandra Abts, RN Compression Type: Three Layer Post Procedure Diagnosis Same as Pre-procedure Electronic Signature(s) Signed: 11/04/2019 5:42:40 PM By: Zandra Abts RN, BSN Entered By: Zandra Abts on 11/04/2019 15:07:44 -------------------------------------------------------------------------------- Compression Therapy Details Patient Name: Date of Service: [name], Loghan J. 11/04/2019 1:45 PM Medical Record Number: [id number] Patient Account Number: [id number] Date of Birth/Sex: Treating RN: September 25, 1955 (64 y.o. [name] Primary Care Sheila Ocasio: MA SO UDI, [place]lim[place] MA LSA DA T Other Clinician: Referring Aunesti Pellegrino: Treating Fraidy Mccarrick/Extender: Baltazar Najjar MA SO UDI, ELHA MA LSA DA T Weeks in Treatment: 40 Compression Therapy Performed for Wound Assessment: Wound #13 Right,Anterior Lower Leg Performed By: Clinician Zandra Abts, RN Compression Type: Three Layer Post Procedure Diagnosis Same as Pre-procedure Electronic Signature(s) Signed: 11/04/2019 5:42:40 PM By: Zandra Abts RN, BSN Entered By: Zandra Abts on 11/04/2019 15:07:44 -------------------------------------------------------------------------------- Compression Therapy Details Patient Name: Date of Service: [name], Adore J. 11/04/2019 1:45 PM Medical Record Number: [id number] Patient Account Number: [id number] Date of Birth/Sex: Treating RN: 1955/06/15 (64 y.o. [name] Primary Care Clerence Gubser: MA SO UDI, Judene Companion MA LSA DA T Other Clinician: Referring Rani Idler: Treating Bralyn Folkert/Extender: Baltazar Najjar MA SO UDI, ELHA MA LSA DA T Weeks in Treatment: 40 Compression Therapy Performed for Wound Assessment: Wound #9 Right,Medial Foot Performed By: Clinician Zandra Abts, RN Compression Type: Three Layer Post Procedure Diagnosis Same  as Pre-procedure Electronic Signature(s) Signed: 11/04/2019 5:42:40 PM By: Zandra Abts RN, BSN Entered By: Zandra Abts on 11/04/2019 15:07:44 -------------------------------------------------------------------------------- Encounter Discharge Information Details Patient Name: Date of Service: [name], Channell J. 11/04/2019 1:45 PM Medical Record Number: [id number] Patient Account Number: [id number] Date of Birth/Sex: Treating RN: Apr 22, 1955 (64 y.o. F) Dwiggins, Carollee Herter Primary Care Elliannah Wayment: MA SO UDI, ELHA MA LSA DA T Other Clinician: Referring Onelia Cadmus: Treating Trestan Vahle/Extender:  Baltazar Najjar MA SO UDI, ELHA MA LSA DA T Weeks in Treatment: 40 Encounter Discharge Information Items Post Procedure Vitals Discharge Condition: Stable Temperature (F): 98 Ambulatory Status: Crutches Pulse (bpm): 65 Discharge Destination: Home Respiratory Rate (breaths/min): 18 Transportation: Private Auto Blood Pressure (mmHg): 105/67 Accompanied By: self Schedule Follow-up Appointment: Yes Clinical Summary of Care: Patient Declined Electronic Signature(s) Signed: 11/05/2019 11:49:38 AM By: [name] Entered By: [name] on 11/04/2019 15:50:21 -------------------------------------------------------------------------------- Lower Extremity Assessment Details Patient Name: Date of Service: [facility] 11/04/2019 1:45 PM Medical Record Number: [id number] Patient Account Number: [id number] Date of Birth/Sex: Treating RN: 11/26/1955 (64 y.o. [name] Primary Care Zeina Akkerman: MA SO UDI, [place] MA LSA DA T Other Clinician: Referring Zakary Kimura: Treating Jaxx Huish/Extender: Baltazar Najjar MA SO UDI, ELHA MA LSA DA T Weeks in Treatment: 40 Edema Assessment Assessed: [Left: No] [Right: No] Edema: [Left: N] [Right: o] Calf Left: Right: Point of Measurement: 43 cm From Medial Instep 34.1 cm Ankle Left: Right: Point of Measurement: 12 cm From Medial Instep  23 cm Vascular Assessment Pulses: Dorsalis Pedis Palpable: [Right:Yes] Electronic Signature(s) Signed: 11/04/2019 5:10:38 PM By: Zenaida Deed RN, BSN Entered By: Zenaida Deed on 11/04/2019 14:17:27 -------------------------------------------------------------------------------- Multi Wound Chart Details Patient Name: Date of Service: [name], Analya J. 11/04/2019 1:45 PM Medical Record Number: [id number] Patient Account Number: [id number] Date of Birth/Sex: Treating RN: 01-19-1955 (64 y.o. [name] Primary Care Yoshi Mancillas: MA SO UDI, ELHA MA LSA DA T Other Clinician: Referring Jaesean Litzau: Treating Prabhleen Montemayor/Extender: Baltazar Najjar MA SO UDI, ELHA MA LSA DA T Weeks in Treatment: 40 Vital Signs Height(in): 68 Capillary Blood Glucose(mg/dl): 376 Weight(lbs): 283 Pulse(bpm): 65 Body Mass Index(BMI): 40 Blood Pressure(mmHg): 105/67 Temperature(F): 98 Respiratory Rate(breaths/min): 18 Photos: [12:No Photos Right, Medial Malleolus] [13:No Photos Right, Anterior Lower Leg] [9:No Photos Right, Medial Foot] Wound Location: [12:Trauma] [13:Trauma] [9:Blister] Wounding Event: [12:Diabetic Wound/Ulcer of the Lower] [13:Diabetic Wound/Ulcer of the Lower] [9:Diabetic Wound/Ulcer of the Lower] Primary Etiology: [12:Extremity Glaucoma, Chronic sinus] [13:Extremity Glaucoma, Chronic sinus] [9:Extremity Glaucoma, Chronic sinus] Comorbid History: [12:problems/congestion, Anemia, Sleep problems/congestion, Anemia, Sleep problems/congestion, Anemia, Sleep Apnea, Hypertension, Type II Diabetes, Rheumatoid Arthritis, Neuropathy 11/04/2019] [13:Apnea, Hypertension, Type II Diabetes,  Rheumatoid Arthritis, Neuropathy 11/04/2019] [9:Apnea, Hypertension, Type II Diabetes, Rheumatoid Arthritis, Neuropathy 06/18/2018] Date Acquired: [12:0] [13:0] [9:40] Weeks of Treatment: [12:Open] [13:Open] [9:Open] Wound Status: [12:1.8x1x0.1] [13:1x1.2x0.1] [9:1.3x1.3x0.4] Measurements L x W x D (cm)  [12:1.414] [13:0.942] [9:1.327] A (cm) : rea [12:0.141] [13:0.094] [9:0.531] Volume (cm) : [12:N/A] [13:N/A] [9:87.90%] % Reduction in A [12:rea: N/A] [13:N/A] [9:94.60%] % Reduction in Volume: [12:Grade 1] [13:Grade 1] [9:Grade 2] Classification: [12:Medium] [13:Medium] [9:Large] Exudate A mount: [12:Serosanguineous] [13:Serosanguineous] [9:Serosanguineous] Exudate Type: [12:red, brown] [13:red, brown] [9:red, brown] Exudate Color: [12:Flat and Intact] [13:Flat and Intact] [9:Thickened] Wound Margin: [12:Large (67-100%)] [13:Large (67-100%)] [9:Large (67-100%)] Granulation A mount: [12:Red, Pink] [13:Red, Pink] [9:Red] Granulation Quality: [12:Small (1-33%)] [13:Small (1-33%)] [9:None Present (0%)] Necrotic A mount: [12:Fat Layer (Subcutaneous Tissue): Yes Fat Layer (Subcutaneous Tissue): Yes Fat Layer (Subcutaneous Tissue): Yes] Exposed Structures: [12:Fascia: No Tendon: No Muscle: No Joint: No Bone: No Small (1-33%)] [13:Fascia: No Tendon: No Muscle: No Joint: No Bone: No Small (1-33%)] [9:Fascia: No Tendon: No Muscle: No Joint: No Bone: No None] Epithelialization: [12:N/A] [13:N/A] [9:Debridement - Excisional] Debridement: Pre-procedure Verification/Time Out N/A [13:N/A] [9:15:02] Taken: [12:N/A] [13:N/A] [9:Callus, Subcutaneous] Tissue Debrided: [12:N/A] [13:N/A] [9:Skin/Subcutaneous Tissue] Level: [12:N/A] [13:N/A] [9:1.69] Debridement A (sq cm): [12:rea N/A] [13:N/A] [9:Curette] Instrument: [12:N/A] [13:N/A] [9:Swab] Specimen: [12:N/A] [13:N/A] [9:1] Number  of Specimens Taken: [12:N/A] [13:N/A] [9:Minimum] Bleeding: [12:N/A] [13:N/A] [9:Pressure] Hemostasis A chieved: [12:N/A] [13:N/A] [9:0] Procedural Pain: [12:N/A] [13:N/A] [9:0] Post Procedural Pain: [12:N/A] [13:N/A] [9:Procedure was tolerated well] Debridement Treatment Response: [12:N/A] [13:N/A] [9:1.3x1.3x0.4] Post Debridement Measurements L x W x D (cm) [12:N/A] [13:N/A] [9:0.531] Post Debridement Volume: (cm)  [12:Compression Therapy] [13:Compression Therapy] [9:Compression Therapy] Procedures Performed: [9:Debridement] Treatment Notes Electronic Signature(s) Signed: 11/04/2019 5:42:40 PM By: Zandra Abts RN, BSN Signed: 11/05/2019 4:22:08 PM By: Baltazar Najjar MD Entered By: Baltazar Najjar on 11/04/2019 15:27:52 -------------------------------------------------------------------------------- Multi-Disciplinary Care Plan Details Patient Name: Date of Service: [facility], Orchid J. 11/04/2019 1:45 PM Medical Record Number: [id number] Patient Account Number: [id number] Date of Birth/Sex: Treating RN: September 25, 1955 (64 y.o. [name] Primary Care Alohilani Levenhagen: MA SO UDI, Judene Companion MA LSA DA T Other Clinician: Referring Michell Kader: Treating Jamiria Langill/Extender: Baltazar Najjar MA SO UDI, ELHA MA LSA DA T Weeks in Treatment: 40 Active Inactive Wound/Skin Impairment Nursing Diagnoses: Impaired tissue integrity Knowledge deficit related to ulceration/compromised skin integrity Goals: Patient/caregiver will verbalize understanding of skin care regimen Date Initiated: 01/28/2019 Target Resolution Date: 11/29/2019 Goal Status: Active Interventions: Assess patient/caregiver ability to obtain necessary supplies Assess patient/caregiver ability to perform ulcer/skin care regimen upon admission and as needed Assess ulceration(s) every visit Provide education on ulcer and skin care Notes: Electronic Signature(s) Signed: 11/04/2019 5:42:40 PM By: Zandra Abts RN, BSN Entered By: Zandra Abts on 11/04/2019 17:21:17 -------------------------------------------------------------------------------- Pain Assessment Details Patient Name: Date of Service: [name], Adreana J. 11/04/2019 1:45 PM Medical Record Number: [id number] Patient Account Number: [id number] Date of Birth/Sex: Treating RN: 14-Aug-1955 (64 y.o. [name] Primary Care Lanier Felty: MA SO UDI, Simla MA LSA DA T Other  Clinician: Referring Nikai Quest: Treating Xinyi Batton/Extender: Baltazar Najjar MA SO UDI, ELHA MA LSA DA T Weeks in Treatment: 40 Active Problems Location of Pain Severity and Description of Pain Patient Has Paino No Site Locations Rate the pain. Current Pain Level: 0 Pain Management and Medication Current Pain Management: Electronic Signature(s) Signed: 11/04/2019 5:10:38 PM By: Zenaida Deed RN, BSN Entered By: Zenaida Deed on 11/04/2019 14:04:03 -------------------------------------------------------------------------------- Patient/Caregiver Education Details Patient Name: Date of Service: Gypsy Decant 10/18/2021andnbsp1:45 PM Medical Record Number: [id number] Patient Account Number: [id number] Date of Birth/Gender: Treating RN: Jan 02, 1956 (64 y.o. [name] Primary Care Physician: MA SO UDI, Judene Companion MA LSA DA T Other Clinician: Referring Physician: Treating Physician/Extender: Baltazar Najjar MA SO UDI, ELHA MA LSA DA T Weeks in Treatment: 73 Education Assessment Education Provided To: Patient Education Topics Provided Wound/Skin Impairment: Methods: Explain/Verbal Responses: State content correctly [employer]) Signed: 11/04/2019 5:42:40 PM By: Zandra Abts RN, BSN Entered By: Zandra Abts on 11/04/2019 17:21:31 -------------------------------------------------------------------------------- Wound Assessment Details Patient Name: Date of Service: [name], Nyeli J. 11/04/2019 1:45 PM Medical Record Number: [id number] Patient Account Number: [id number] Date of Birth/Sex: Treating RN: 11/28/1955 (64 y.o. [name] Primary Care Furman Trentman: MA SO UDI, [place] MA LSA DA T Other Clinician: Referring Ella Golomb: Treating Sherman Donaldson/Extender: Baltazar Najjar MA SO UDI, ELHA MA LSA DA T Weeks in Treatment: 40 Wound Status Wound Number: 12 Primary Diabetic Wound/Ulcer of the Lower Extremity Etiology: Wound Location: Right, Medial  Malleolus Wound Open Wounding Event: Trauma Status: Date Acquired: 11/04/2019 Comorbid Glaucoma, Chronic sinus problems/congestion, Anemia, Sleep Weeks Of Treatment: 0 History: Apnea, Hypertension, Type II Diabetes, Rheumatoid Arthritis, Clustered Wound: No Neuropathy Photos Photo Uploaded By: Benjaman Kindler on 11/05/2019 11:55:33 Wound Measurements Length: (cm) 1.8 Width: (cm) 1 Depth: (cm) 0.1 Area: (cm) 1.414 Volume: (cm)  0.141 % Reduction in Area: % Reduction in Volume: Epithelialization: Small (1-33%) Tunneling: No Undermining: No Wound Description Classification: Grade 1 Wound Margin: Flat and Intact Exudate Amount: Medium Exudate Type: Serosanguineous Exudate Color: red, brown Foul Odor After Cleansing: No Slough/Fibrino Yes Wound Bed Granulation Amount: Large (67-100%) Exposed Structure Granulation Quality: Red, Pink Fascia Exposed: No Necrotic Amount: Small (1-33%) Fat Layer (Subcutaneous Tissue) Exposed: Yes Necrotic Quality: Adherent Slough Tendon Exposed: No Muscle Exposed: No Joint Exposed: No Bone Exposed: No Treatment Notes Wound #12 (Right, Medial Malleolus) 1. Cleanse With Wound Cleanser Soap and water 2. Periwound Care Moisturizing lotion 3. Primary Dressing Applied Calcium Alginate Ag 4. Secondary Dressing ABD Pad Foam Kerramax/Xtrasorb 6. Support Layer Applied 3 layer compression wrap Notes Scientist, research (physical sciences)) Signed: 11/04/2019 5:10:38 PM By: Zenaida Deed RN, BSN Entered By: Zenaida Deed on 11/04/2019 14:25:26 -------------------------------------------------------------------------------- Wound Assessment Details Patient Name: Date of Service: [name], Storey J. 11/04/2019 1:45 PM Medical Record Number: [id number] Patient Account Number: [id number] Date of Birth/Sex: Treating RN: March 16, 1955 (64 y.o. [name] Primary Care Sua Spadafora: MA SO UDI, [place] MA LSA DA T Other Clinician: Referring  Jovane Foutz: Treating Lamondre Wesche/Extender: Baltazar Najjar MA SO UDI, ELHA MA LSA DA T Weeks in Treatment: 40 Wound Status Wound Number: 13 Primary Diabetic Wound/Ulcer of the Lower Extremity Etiology: Wound Location: Right, Anterior Lower Leg Wound Open Wounding Event: Trauma Status: Date Acquired: 11/04/2019 Comorbid Glaucoma, Chronic sinus problems/congestion, Anemia, Sleep Weeks Of Treatment: 0 History: Apnea, Hypertension, Type II Diabetes, Rheumatoid Arthritis, Clustered Wound: No Neuropathy Photos Photo Uploaded By: Benjaman Kindler on 11/05/2019 11:55:13 Wound Measurements Length: (cm) 1 Width: (cm) 1.2 Depth: (cm) 0.1 Area: (cm) 0.942 Volume: (cm) 0.094 % Reduction in Area: % Reduction in Volume: Epithelialization: Small (1-33%) Tunneling: No Undermining: No Wound Description Classification: Grade 1 Wound Margin: Flat and Intact Exudate Amount: Medium Exudate Type: Serosanguineous Exudate Color: red, brown Foul Odor After Cleansing: No Slough/Fibrino Yes Wound Bed Granulation Amount: Large (67-100%) Exposed Structure Granulation Quality: Red, Pink Fascia Exposed: No Necrotic Amount: Small (1-33%) Fat Layer (Subcutaneous Tissue) Exposed: Yes Necrotic Quality: Adherent Slough Tendon Exposed: No Muscle Exposed: No Joint Exposed: No Bone Exposed: No Treatment Notes Wound #13 (Right, Anterior Lower Leg) 1. Cleanse With Wound Cleanser Soap and water 2. Periwound Care Moisturizing lotion 3. Primary Dressing Applied Calcium Alginate Ag 4. Secondary Dressing ABD Pad Foam Kerramax/Xtrasorb 6. Support Layer Applied 3 layer compression wrap Notes Scientist, research (physical sciences)) Signed: 11/04/2019 5:10:38 PM By: Zenaida Deed RN, BSN Entered By: Zenaida Deed on 11/04/2019 14:26:38 -------------------------------------------------------------------------------- Wound Assessment Details Patient Name: Date of Service: [name], Altie J. 11/04/2019  1:45 PM Medical Record Number: [id number] Patient Account Number: [id number] Date of Birth/Sex: Treating RN: Jun 17, 1955 (64 y.o. [name] Primary Care Mysty Kielty: MA SO UDI, [place] MA LSA DA T Other Clinician: Referring Anaysha Andre: Treating Tamer Baughman/Extender: Baltazar Najjar MA SO UDI, ELHA MA LSA DA T Weeks in Treatment: 40 Wound Status Wound Number: 9 Primary Diabetic Wound/Ulcer of the Lower Extremity Etiology: Wound Location: Right, Medial Foot Wound Open Wounding Event: Blister Status: Date Acquired: 06/18/2018 Comorbid Glaucoma, Chronic sinus problems/congestion, Anemia, Sleep Weeks Of Treatment: 40 History: Apnea, Hypertension, Type II Diabetes, Rheumatoid Arthritis, Clustered Wound: No Neuropathy Photos Photo Uploaded By: Benjaman Kindler on 11/05/2019 11:55:13 Wound Measurements Length: (cm) 1.3 % R Width: (cm) 1.3 % R Depth: (cm) 0.4 Epi Area: (cm) 1.327 Tu Volume: (cm) 0.531 Un eduction in Area: 87.9% eduction in Volume: 94.6% thelialization: None nneling: No dermining: No  Wound Description Classification: Grade 2 Fou Wound Margin: Thickened Slo Exudate Amount: Large Exudate Type: Serosanguineous Exudate Color: red, brown l Odor After Cleansing: No ugh/Fibrino No Wound Bed Granulation Amount: Large (67-100%) Exposed Structure Granulation Quality: Red Fascia Exposed: No Necrotic Amount: None Present (0%) Fat Layer (Subcutaneous Tissue) Exposed: Yes Tendon Exposed: No Muscle Exposed: No Joint Exposed: No Bone Exposed: No Treatment Notes Wound #9 (Right, Medial Foot) 1. Cleanse With Wound Cleanser Soap and water 2. Periwound Care Moisturizing lotion 3. Primary Dressing Applied Calcium Alginate Ag 4. Secondary Dressing ABD Pad Foam Kerramax/Xtrasorb 6. Support Layer Applied 3 layer compression wrap Notes Scientist, research (physical sciences)) Signed: 11/04/2019 5:10:38 PM By: Zenaida Deed RN, BSN Entered By: Zenaida Deed on  11/04/2019 14:27:27 -------------------------------------------------------------------------------- Vitals Details Patient Name: Date of Service: [name], Lacresha J. 11/04/2019 1:45 PM Medical Record Number: [id number] Patient Account Number: [id number] Date of Birth/Sex: Treating RN: 18-Dec-1955 (64 y.o. [name] Primary Care Jeremiah Tarpley: MA SO UDI, [place] MA LSA DA T Other Clinician: Referring Rylin Saez: Treating Briseidy Spark/Extender: Baltazar Najjar MA SO UDI, ELHA MA LSA DA T Weeks in Treatment: 40 Vital Signs Time Taken: 14:01 Temperature (F): 98 Height (in): 68 Pulse (bpm): 65 Source: Stated Respiratory Rate (breaths/min): 18 Weight (lbs): 265 Blood Pressure (mmHg): 105/67 Source: Stated Capillary Blood Glucose (mg/dl): 427 Body Mass Index (BMI): 40.3 Reference Range: 80 - 120 mg / dl Notes glucose per pt report this am Electronic Signature(s) Signed: 11/04/2019 5:10:38 PM By: Zenaida Deed RN, BSN Entered By: Zenaida Deed on 11/04/2019 14:02:11

## 2019-11-05 NOTE — Progress Notes (Signed)
[name], [name] ([id number]) Visit Report for 11/04/2019 Debridement Details Patient Name: Date of Service: [name], [name] 11/04/2019 1:45 PM Medical Record Number: [id number] Patient Account Number: [id number] Date of Birth/Sex: Treating RN: 1955/03/03 (64 y.o. [name] Primary Care Provider: MA SO UDI, [place] MA LSA DA T Other Clinician: Referring Provider: Treating Provider/Extender: Baltazar Najjar MA SO UDI, ELHA MA LSA DA T Weeks in Treatment: 40 Debridement Performed for Assessment: Wound #9 Right,Medial Foot Performed By: Physician [name], MD Debridement Type: Debridement Severity of Tissue Pre Debridement: Fat layer exposed Level of Consciousness (Pre-procedure): Awake and Alert Pre-procedure Verification/Time Out Yes - 15:02 Taken: Start Time: 15:02 T Area Debrided (L x W): otal 1.3 (cm) x 1.3 (cm) = 1.69 (cm) Tissue and other material debrided: Viable, Non-Viable, Callus, Subcutaneous Level: Skin/Subcutaneous Tissue Debridement Description: Excisional Instrument: Curette Specimen: Swab, Number of Specimens T aken: 1 Bleeding: Minimum Hemostasis Achieved: Pressure End Time: 15:03 Procedural Pain: 0 Post Procedural Pain: 0 Response to Treatment: Procedure was tolerated well Level of Consciousness (Post- Awake and Alert procedure): Post Debridement Measurements of Total Wound Length: (cm) 1.3 Width: (cm) 1.3 Depth: (cm) 0.4 Volume: (cm) 0.531 Character of Wound/Ulcer Post Debridement: Improved Severity of Tissue Post Debridement: Fat layer exposed Post Procedure Diagnosis Same as Pre-procedure Electronic Signature(s) Signed: 11/04/2019 5:42:40 PM By: Zandra Abts RN, BSN Signed: 11/05/2019 4:22:08 PM By: Baltazar Najjar MD Entered By: Baltazar Najjar on 11/04/2019 15:28:11 -------------------------------------------------------------------------------- HPI Details Patient Name: Date of Service: [name], [name] 11/04/2019  1:45 PM Medical Record Number: [id number] Patient Account Number: [id number] Date of Birth/Sex: Treating RN: 11/27/1955 (64 y.o. [name] Primary Care Provider: MA SO UDI, [place] MA LSA DA T Other Clinician: Referring Provider: Treating Provider/Extender: Baltazar Najjar MA SO UDI, ELHA MA LSA DA T Weeks in Treatment: 40 History of Present Illness HPI Description: 64 yrs old bf here for follow up recurrent left charcot foot ulcer. She has DM. She is not a smoker. She states a blister developed 3 months ago at site of previous breakdown from 1 year ago. It was debrided at the podiatrist's office. She went to the ER and then sent here. She denies pain. Xray was negative for osteo. Culture from this clinic negative. 09/08/14 Referred by [name] to [name] for Charcot foot. Seen by him and MRI scheduled 09/19/14. Prior silver collagen but this was not ordered last visit, patient has been rinsing with saline alone. Re institute silver collagen every other day. F/u 2 weeks with [name] as Labor Day holiday. Supplies ordered. 09/25/14; this is a patient with a Charcot foot on the left. she has a wound over the plantar aspect of her left calcaneus. She has been seen by [name] of orthopedic surgery and has had an MRI. She is apparently going within the next week or 2 for corrective surgery which will involve an external fixator. I don't have any of the details of this. 10/06/14; The patient is a 64 yrs old bf with a left Charcot foot and ulcer on the plantar. 12/01/14 Returns post surgery with [name]. Has follow up visit later this week with him, currently in facility and NWB over this extremity. States no drainage from foot and no current wound care. On exam callus and scab in place, suspect healed. Has external fixator in place. Will defer to [name] for management, ok to place moisturizing cream over scabs and callus and she will call if she requires follow  up  here. READMISSION 01/28/2019 Patient was in the clinic in 2016 for a wound on the plantar aspect of her left calcaneus. Predominantly cared for by [name] she was referred to [name] and had corrective surgery for the position of her left ankle I believe. She had previously been here in 2015 and then prior to that in 2011 2012 that I do not have these records. More recently she has developed fairly extensive wounds on the right medial foot, left lateral foot and a small area on the right medial great toe. Right medial foot wound has been there for 6 to 7 months, left foot wound for 2 to 3 months and the right great toe only over the last few weeks. She has been followed by [name] at Dr. Laverta Baltimore office it sounds as though she has been undergoing callus paring and silver nitrate. The patient has been washing these off with soap and water and plan applying dry dressing Past medical history, type 2 diabetes well controlled with a recent hemoglobin A1c of 7 on 11/16, type 2 diabetes with peripheral neuropathy, previous left Charcot joint surgery bilateral Charcot joints currently, stage III chronic renal failure, hypertension, obstructive sleep apnea, history of MRSA and gastroesophageal reflux. 1/18; this is a patient with bilateral Charcot feet. Plain x-rays that I did of both of these wounds that are either at bone or precariously close to bone were done. On the right foot this showed possible lytic destruction involving the anterior aspect of the talus which may represent a wound osteomyelitis. An MRI was recommended. On the left there was no definite lytic destruction although the wound is deep undermining's and I think probably requires an MRI on this basis in any case 1/25; patient was supposed to have her MRI last Friday of her bilateral feet however they would not do it because there was silver alginate in her dressings, will replace with calcium alginate today and will see if we  can get her done today 2/1; patient did not get her MRIs done last week because of issues with the MRI department receiving orders to do bilateral feet. She has 2 deep wounds in the setting of Charcot feet deformities bilaterally. Both of them at one point had exposed bone although I had trouble demonstrating that today. I am not sure that we can offload these very aggressively as I watched her leave the clinic last week her gait is very narrow and unsteady. 2/15; the MRI of her right foot did not show osteomyelitis potentially osteonecrosis of the talus. 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 The wounds are a large wound on the right medial foot and on the left a substantial wound on the left lateral plantar calcaneus. We have been using silver alginate. Ultimately I think we need to consider a total contact cast on the right while we work through the possibility of osteomyelitis in the left. I watched her walk out with her crutches I have some trepidation about the ability to walk with the cast on her right foot. Nevertheless with her Charcot deformities I do not think there is a way to heal these short of offloading them aggressively. 2/22; the culture of the bone in the left foot that I did last week showed a few Citrobacter Koseri as well as some streptococcal species. In my attempted bone for pathology did not actually yield a lot of bone the pathologist did not identify any osteomyelitis.  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 Is promised today and aggressive debridement of the right foot and a total contact cast which surprisingly she seemed to tolerate quite well 03/13/2019 upon evaluation today patient appears to be doing well with her total contact cast she is here for the first cast change.  She had no areas of rubbing or any other complications at this point. Overall things are doing quite well. 2/26; the patient was kindly seen by Dr. Orvan Falconer of infectious disease on 2/25. She is now on IV vancomycin PICC line placement in 2 days and oral Flagyl. This was in response to her MRI showing osteomyelitis of the left calcaneus concerning for osteomyelitis. We have been putting her right leg in a total contact cast. Our nurses report drainage. She is tolerating the total contact cast well. 3/4; the patient is started her IV antibiotics and is taking IV vancomycin and oral Flagyl. She appears to be tolerating these both well. This is for osteomyelitis involving the left calcaneus. The area on the right in the setting of bilateral Charcot deformities did not have any bone infection on MRI. We put a total contact cast on her with silver alginate as the primary dressing and she returns today in follow-up. Per our intake nurse there was too much drainage to leave this on all week therefore we will bring her back for an early change 3/8; follow-up total contact cast she is still having a lot of drainage probably too much drainage from the right foot wound will week with the same cast. She will be back on Thursday. The area on the left looks somewhat better 3/11; patient here for a total contact cast change. 3/15; patient came in for wound care evaluation. She is still on IV vancomycin and oral Flagyl for osteomyelitis in the left foot. The area on the right foot we are putting in a total contact cast. 3/18 for total contact cast change on the right foot 3/22; the patient was here for wound review today. The area on the right foot is slightly smaller in terms of surface area. However the surface of the wound is very gritty and hyper granulated. More problematically the area on the plantar left foot has increased depth indeed in the [name] of this it probes to bone. We have been using Hydrofera  Blue in both areas. She is on antibiotics as prescribed by infectious disease IV vancomycin and oral Flagyl 3/25; the patient is in for total contact cast change. Our intake nurse was concerned about the amount of drainage which soaked right to the multiple layers we are putting on this. Wondered about options. The wound bed actually looks as healthy as this is looked although there may not be a lot of change in dimensions things are looking a lot better. If we are going to heal this woman's wound which is in the middle of her right Charcot foot this is going to need to be offloaded. There are not many alternatives 3/29; still not a lot of improvement although the surface of the right wound looks better. We have been using Hydrofera Blue 04/17/2019 upon evaluation today patient appears to be doing well with a total contact cast. With actually having to change this twice a week. She saw [name] for wound care earlier in the week this is  for the cast change today. That is due to the fact that she has significant drainage. Overall the wound is been looking excellent however. 4/5; we continue to put a total contact cast on this patient with Hydrofera Blue. Still a lot of drainage which precludes a simple weekly change or changing this twice a week. 4/8; total contact cast changed today. Apparently the wound looks better per our intake nurse 4/12; patient here for wound care evaluation. Wounds on the right foot have not changed in dimension none about a month left foot looks similar. Apparently her antibiotics that we are giving her for osteomyelitis in the left foot complete on Wednesday. We have been using Hydrofera Blue on the right foot under a total contact cast. Nurses report greenish drainage although I think this may be discoloration from the [facility] 4/15; back for cast change wounds look quite good although still moderate amount of drainage. T contact cast reapplied otal 4/19;. Wound  evaluation. Wound on the right foot is smaller surface looks healthy. There is still the divot in the middle part of this wound but I could not feel any bone. Area on the left foot also looks better She has completed her antibiotics but still has the PICC line in. 4/23; patient comes back for a total contact cast change this was done in the standard fashion no issues 4/26; wound measures slightly larger on the right foot which is disappointing. She sees Dr. Orvan Falconer with regards to her antibiotics on Wednesday. I believe she is on vancomycin IV and oral Flagyl. 4/29; in for her routine total contact cast change. She saw Dr. Orvan Falconer this morning. He is asking about antibiotic continuation I will be in touch with him. I did not look at her wound today 5/3; patient saw Dr. Orvan Falconer on 4/28. I did send him a note asking about the continuation of perhaps an oral regimen. I have not heard back. She still has an elevated sedimentation rate at 81. Her wounds are smaller. 5/6; in for her total contact cast change. We did not look at the wound today. She has seen Dr. Orvan Falconer and is now on oral Keflex and Flagyl. She seems to be tolerating these well 5/10; she had her PICC line removed. The wound on the right foot after some initial improvement has not made any improvement even with the total contact cast. Still having a lot of drainage. Likewise the area on the left foot really is not any better either 5/14; in for a cast change today. We will look at the right foot on Monday. If this is not making any progress I am going to try to cast the left foot. 5/17; the wound on the right foot is absolutely no different. I am going to put ointment on this area and change the cast of the left foot. We have been using silver alginate there 5/21; apparently the area on the right foot was better per our intake nurse although I did not see it. We did a standard total contact cast change on the left foot we have been  applying silver alginate to the wound here. 06/10/19-Area on the right foot per dimensions slightly better, the left foot has still some depth applying silver alginate to the wound with TCC 06/13/19-Patient here for Casting to Left foot 6/1; patient had the total contact cast were placed on the left foot. Noted some swelling in her right leg although the dimensions of the wound on the right foot are  better. We have been using PolyMem on the right and silver alginate on the left under the cast 6/7; we continue to make nice progress in both her plantar feet wound. We are putting the total contact cast on the left. Surprisingly in spite of this the area on the right medial foot in the middle of her Charcot deformities actually is making progress as well 07/01/2019 upon evaluation today patient actually appears to be doing quite well with regard to her wounds on the feet compared to when I saw her last that has been quite some time. With that being said I do believe the total contact cast has been beneficial in this left foot and she has a lot of signs of improvement she does have some callus overhanging which is caused a little bit of a ledge and an issue here. I do believe that we may be able to do something to help in that regard. Fortunately otherwise the wound seems to be progressing quite nicely. 6/21; the patient's area on the left foot that we are currently casting is just about closed. The area on the right medial foot has a healthy looking surface but may be not changed as much from last week in terms of surface area. We are using silver alginate on the right and polymen Ag under the cast 6/28; only a small open area on the left foot remains. Using silver alginate under a total contact cast. She has a diabetic shoe I have asked her to bring that for next week where this may be closed. The area on the right foot with a Charcot deformity is also measuring slightly smaller. We are using PolyMem Ag  here 7/6; left foot is totally closed. She does not require a total contact cast. She will graduate to her own diabetic shoe. The area on the right foot with a Charcot deformity measuring about the same. We have been using polymen under compression wraps. She did not do well in this area with a total contact cast 7/13; left foot remains intact. She is skillfully covering this area with foam and using her diabetic shoe. Slight area improvement of the right foot wound. Still rolled senescent looking edges. We have been using polymen 7/19; left foot remains intact. Wound measuring slightly smaller on the right foot in the middle of the Charcot deformity. We have been using PolyMem Ag 7/26; per the patient left foot remains intact. The wound on the right foot looks slightly smaller but dimensions are measuring the same. We have been using polymen Ag 8/9-Patient back after 2 weeks, wound of the right foot again looking slightly smaller, continuing to use PolyMem silver 8/16; patient has made some minor improvements in wound area on the right foot. Using polymen Ag. The left foot remains healed 8/23; continued slight improvement in dimensions. Rolled edges around the wound edge noted. We have been using polymen Ag. Patient offloading with a surgical shoe and crutches 8/30; no change in surface area. Thick rolled skin and subcutaneous tissue around the edges debris on the wound surface. We have been using polymen Ag initially with some improvement however stalled on the last 2 weeks in terms of surface area 9/13; surface area is smaller still using polymen Ag. Thick rolled skin and subcutaneous tissue around the edges requiring debridement. 9/20; again surface area is improved using polymen Ag however each week she develops thick rolled skin and subcutaneous tissue around the wound edges requiring debridement. She is offloading this using crutches  9/27; we've been using PolyMem AG. Wound does not look any  different from last week. Raised edges of callus skin and subcutaneous tissue around the wound bed. 10/5; we been using polymen Ag. No ability to put a total contact cast on today in the clinic. Wound is about the same still rolled edges of thick callus around the margins 10/18; we put her in a total contact cast last week things did not go well. Not only does the wound looked worse macerated angry with thick callus and some warmth around it. She had another wound above this and one on the anterior right leg. I am not totally certain why this happened. She has not been systemically unwell Electronic Signature(s) Signed: 11/05/2019 4:22:08 PM By: Baltazar Najjar MD Entered By: Baltazar Najjar on 11/04/2019 15:29:02 -------------------------------------------------------------------------------- Physical Exam Details Patient Name: Date of Service: [name], [name] 11/04/2019 1:45 PM Medical Record Number: [id number] Patient Account Number: [id number] Date of Birth/Sex: Treating RN: 03-Feb-1955 (64 y.o. [name] Primary Care Provider: MA SO UDI, [place] MA LSA DA T Other Clinician: Referring Provider: Treating Provider/Extender: Baltazar Najjar MA SO UDI, ELHA MA LSA DA T Weeks in Treatment: 40 Constitutional Sitting or standing Blood Pressure is within target range for patient.. Pulse regular and within target range for patient.Marland Kitchen Respirations regular, non-labored and within target range.. Temperature is normal and within the target range for the patient.Marland Kitchen Appears in no distress. Notes Wound exam; right medial plantar foot not in good shape. Thick callus skin macerated somewhat angry and warm. Above this she had a cast injury wound also a cast injury wound on the right anterior lower leg Electronic Signature(s) Signed: 11/05/2019 4:22:08 PM By: Baltazar Najjar MD Entered By: Baltazar Najjar on 11/04/2019  15:30:43 -------------------------------------------------------------------------------- Physician Orders Details Patient Name: Date of Service: [name], [name] 11/04/2019 1:45 PM Medical Record Number: [id number] Patient Account Number: [id number] Date of Birth/Sex: Treating RN: [birth year] (64 y.o. [name] Primary Care Provider: MA SO UDI, [place] MA LSA DA T Other Clinician: Referring Provider: Treating Provider/Extender: Baltazar Najjar MA SO UDI, ELHA MA LSA DA T Weeks in Treatment: 50 Verbal / Phone Orders: No Diagnosis Coding ICD-10 Coding Code Description E11.621 Type 2 diabetes mellitus with foot ulcer L97.511 Non-pressure chronic ulcer of other part of right foot limited to breakdown of skin E11.42 Type 2 diabetes mellitus with diabetic polyneuropathy M14.671 Charcot's joint, right ankle and foot Follow-up Appointments Return Appointment in 1 week. Dressing Change Frequency Do not change entire dressing for one week. - all wounds Wound Cleansing May shower with protection. - use cast protector Primary Wound Dressing Wound #12 Right,Medial Malleolus Calcium Alginate with Silver Wound #13 Right,Anterior Lower Leg Calcium Alginate with Silver Wound #9 Right,Medial Foot Calcium Alginate with Silver Secondary Dressing Wound #9 Right,Medial Foot Foam - foam donut ABD pad Zetuvit or Kerramax Wound #12 Right,Medial Malleolus Dry Gauze Wound #13 Right,Anterior Lower Leg Dry Gauze Edema Control 3 Layer Compression System - Right Lower Extremity Avoid standing for long periods of time Elevate legs to the level of the heart or above for 30 minutes daily and/or when sitting, a frequency of: - throughout the day Exercise regularly Off-Loading Open toe surgical shoe to: - right foot Laboratory naerobe culture (MICRO) - right medial foot - (ICD10 E11.621 - Type 2 diabetes mellitus with Bacteria identified in Unspecified specimen by A foot ulcer) LOINC Code:  635-3 Convenience Name: Anerobic culture Electronic Signature(s) Signed: 11/04/2019 5:42:40 PM By: Zandra Abts RN,  BSN Signed: 11/05/2019 4:22:08 PM By: Baltazar Najjar MD Entered By: Zandra Abts on 11/04/2019 15:06:56 -------------------------------------------------------------------------------- Problem List Details Patient Name: Date of Service: [name], [name] 11/04/2019 1:45 PM Medical Record Number: [id number] Patient Account Number: [id number] Date of Birth/Sex: Treating RN: January 18, 1956 (64 y.o. [name] Primary Care Provider: MA SO UDI, [place] MA LSA DA T Other Clinician: Referring Provider: Treating Provider/Extender: Abe People, ELHA MA LSA DA T Weeks in Treatment: 46 Active Problems ICD-10 Encounter Code Description Active Date MDM Diagnosis E11.621 Type 2 diabetes mellitus with foot ulcer 01/28/2019 No Yes L97.511 Non-pressure chronic ulcer of other part of right foot limited to breakdown of 01/28/2019 No Yes skin E11.42 Type 2 diabetes mellitus with diabetic polyneuropathy 01/28/2019 No Yes M14.671 Charcot's joint, right ankle and foot 01/28/2019 No Yes Inactive Problems ICD-10 Code Description Active Date Inactive Date L97.524 Non-pressure chronic ulcer of other part of left foot with necrosis of bone 01/28/2019 01/28/2019 L97.514 Non-pressure chronic ulcer of other part of right foot with necrosis of bone 01/28/2019 01/28/2019 W09.811 Charcot's joint, left ankle and foot 01/28/2019 01/28/2019 B14.782 Other chronic osteomyelitis, left ankle and foot 04/08/2019 04/08/2019 Resolved Problems Electronic Signature(s) Signed: 11/05/2019 4:22:08 PM By: Baltazar Najjar MD Entered By: Baltazar Najjar on 11/04/2019 15:25:19 -------------------------------------------------------------------------------- Progress Note Details Patient Name: Date of Service: [name], [name] 11/04/2019 1:45 PM Medical Record Number: [id number] Patient Account  Number: [id number] Date of Birth/Sex: Treating RN: May 20, 1955 (64 y.o. [name] Primary Care Provider: MA SO UDI, [place] MA LSA DA T Other Clinician: Referring Provider: Treating Provider/Extender: Baltazar Najjar MA SO UDI, ELHA MA LSA DA T Weeks in Treatment: 40 Subjective History of Present Illness (HPI) 64 yrs old bf here for follow up recurrent left charcot foot ulcer. She has DM. She is not a smoker. She states a blister developed 3 months ago at site of previous breakdown from 1 year ago. It was debrided at the podiatrist's office. She went to the ER and then sent here. She denies pain. Xray was negative for osteo. Culture from this clinic negative. 09/08/[name] to [name] for Charcot foot. Seen by him and MRI scheduled 09/19/14. Prior silver collagen but this was not ordered last visit, patient has been rinsing with saline alone. Re institute silver collagen every other day. F/u 2 weeks with [name] as Labor Day holiday. Supplies ordered. 09/25/14; this is a patient with a Charcot foot on the left. she has a wound over the plantar aspect of her left calcaneus. She has been seen by [name] of orthopedic surgery and has had an MRI. She is apparently going within the next week or 2 for corrective surgery which will involve an external fixator. I don't have any of the details of this. 10/06/14; The patient is a 64 yrs old bf with a left Charcot foot and ulcer on the plantar. 12/01/14 Returns post surgery with [name]. Has follow up visit later this week with him, currently in facility and NWB over this extremity. States no drainage from foot and no current wound care. On exam callus and scab in place, suspect healed. Has external fixator in place. Will defer to [name] for management, ok to place moisturizing cream over scabs and callus and she will call if she requires follow up here. READMISSION 01/28/2019 Patient was in the clinic in 2016 for a wound  on the plantar aspect of her left calcaneus. Predominantly cared for by  [name] she was referred to [name] and had corrective surgery for the position of her left ankle I believe. She had previously been here in 2015 and then prior to that in 2011 2012 that I do not have these records. More recently she has developed fairly extensive wounds on the right medial foot, left lateral foot and a small area on the right medial great toe. Right medial foot wound has been there for 6 to 7 months, left foot wound for 2 to 3 months and the right great toe only over the last few weeks. She has been followed by [name] at Dr. Laverta [place] office it sounds as though she has been undergoing callus paring and silver nitrate. The patient has been washing these off with soap and water and plan applying dry dressing Past medical history, type 2 diabetes well controlled with a recent hemoglobin A1c of 7 on 11/16, type 2 diabetes with peripheral neuropathy, previous left Charcot joint surgery bilateral Charcot joints currently, stage III chronic renal failure, hypertension, obstructive sleep apnea, history of MRSA and gastroesophageal reflux. 1/18; this is a patient with bilateral Charcot feet. Plain x-rays that I did of both of these wounds that are either at bone or precariously close to bone were done. On the right foot this showed possible lytic destruction involving the anterior aspect of the talus which may represent a wound osteomyelitis. An MRI was recommended. On the left there was no definite lytic destruction although the wound is deep undermining's and I think probably requires an MRI on this basis in any case 1/25; patient was supposed to have her MRI last Friday of her bilateral feet however they would not do it because there was silver alginate in her dressings, will replace with calcium alginate today and will see if we can get her done today 2/1; patient did not get her MRIs done last week  because of issues with the MRI department receiving orders to do bilateral feet. She has 2 deep wounds in the setting of Charcot feet deformities bilaterally. Both of them at one point had exposed bone although I had trouble demonstrating that today. I am not sure that we can offload these very aggressively as I watched her leave the clinic last week her gait is very narrow and unsteady. 2/15; the MRI of her right foot did not show osteomyelitis potentially osteonecrosis of the talus. 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 The wounds are a large wound on the right medial foot and on the left a substantial wound on the left lateral plantar calcaneus. We have been using silver alginate. Ultimately I think we need to consider a total contact cast on the right while we work through the possibility of osteomyelitis in the left. I watched her walk out with her crutches I have some trepidation about the ability to walk with the cast on her right foot. Nevertheless with her Charcot deformities I do not think there is a way to heal these short of offloading them aggressively. 2/22; the culture of the bone in the left foot that I did last week showed a few Citrobacter Koseri as well as some streptococcal species. In my attempted bone for pathology did not actually yield a lot of bone the pathologist did not identify any osteomyelitis. 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 Is promised today and aggressive debridement of the right foot and a total contact cast which surprisingly she seemed to tolerate quite well 03/13/2019 upon evaluation today patient appears to be doing well with her total contact cast she is here for the first cast change. She had no areas of rubbing or any other complications at this point.  Overall things are doing quite well. 2/26; the patient was kindly seen by Dr. Orvan Falconer of infectious disease on 2/25. She is now on IV vancomycin PICC line placement in 2 days and oral Flagyl. This was in response to her MRI showing osteomyelitis of the left calcaneus concerning for osteomyelitis. We have been putting her right leg in a total contact cast. Our nurses report drainage. She is tolerating the total contact cast well. 3/4; the patient is started her IV antibiotics and is taking IV vancomycin and oral Flagyl. She appears to be tolerating these both well. This is for osteomyelitis involving the left calcaneus. The area on the right in the setting of bilateral Charcot deformities did not have any bone infection on MRI. We put a total contact cast on her with silver alginate as the primary dressing and she returns today in follow-up. Per our intake nurse there was too much drainage to leave this on all week therefore we will bring her back for an early change 3/8; follow-up total contact cast she is still having a lot of drainage probably too much drainage from the right foot wound will week with the same cast. She will be back on Thursday. The area on the left looks somewhat better 3/11; patient here for a total contact cast change. 3/15; patient came in for wound care evaluation. She is still on IV vancomycin and oral Flagyl for osteomyelitis in the left foot. The area on the right foot we are putting in a total contact cast. 3/18 for total contact cast change on the right foot 3/22; the patient was here for wound review today. The area on the right foot is slightly smaller in terms of surface area. However the surface of the wound is very gritty and hyper granulated. More problematically the area on the plantar left foot has increased depth indeed in the [name] of this it probes to bone. We have been using Hydrofera Blue in both areas. She is on antibiotics as prescribed by infectious  disease IV vancomycin and oral Flagyl 3/25; the patient is in for total contact cast change. Our intake nurse was concerned about the amount of drainage which soaked right to the multiple layers we are putting on this. Wondered about options. The wound bed actually looks as healthy as this is looked although there may not be a lot of change in dimensions things are looking a lot better. If we are going to heal this woman's wound which is in the middle of her right Charcot foot this is going to need to be offloaded. There are not many alternatives 3/29; still not a lot of improvement although the surface of the right wound looks better. We have been using Hydrofera Blue 04/17/2019 upon evaluation today patient appears to be doing well with a total contact cast. With actually having to change this twice a week. She saw [name] for wound care earlier in the week this is for the cast change today. That is due to the fact that she has significant drainage. Overall the wound is been looking excellent however. 4/5; we continue  to put a total contact cast on this patient with Hydrofera Blue. Still a lot of drainage which precludes a simple weekly change or changing this twice a week. 4/8; total contact cast changed today. Apparently the wound looks better per our intake nurse 4/12; patient here for wound care evaluation. Wounds on the right foot have not changed in dimension none about a month left foot looks similar. Apparently her antibiotics that we are giving her for osteomyelitis in the left foot complete on Wednesday. We have been using Hydrofera Blue on the right foot under a total contact cast. Nurses report greenish drainage although I think this may be discoloration from the [facility] 4/15; back for cast change wounds look quite good although still moderate amount of drainage. T contact cast reapplied otal 4/19;. Wound evaluation. Wound on the right foot is smaller surface looks healthy.  There is still the divot in the middle part of this wound but I could not feel any bone. Area on the left foot also looks better She has completed her antibiotics but still has the PICC line in. 4/23; patient comes back for a total contact cast change this was done in the standard fashion no issues 4/26; wound measures slightly larger on the right foot which is disappointing. She sees Dr. Orvan Falconer with regards to her antibiotics on Wednesday. I believe she is on vancomycin IV and oral Flagyl. 4/29; in for her routine total contact cast change. She saw Dr. Orvan Falconer this morning. He is asking about antibiotic continuation I will be in touch with him. I did not look at her wound today 5/3; patient saw Dr. Orvan Falconer on 4/28. I did send him a note asking about the continuation of perhaps an oral regimen. I have not heard back. She still has an elevated sedimentation rate at 81. Her wounds are smaller. 5/6; in for her total contact cast change. We did not look at the wound today. She has seen Dr. Orvan Falconer and is now on oral Keflex and Flagyl. She seems to be tolerating these well 5/10; she had her PICC line removed. The wound on the right foot after some initial improvement has not made any improvement even with the total contact cast. Still having a lot of drainage. Likewise the area on the left foot really is not any better either 5/14; in for a cast change today. We will look at the right foot on Monday. If this is not making any progress I am going to try to cast the left foot. 5/17; the wound on the right foot is absolutely no different. I am going to put ointment on this area and change the cast of the left foot. We have been using silver alginate there 5/21; apparently the area on the right foot was better per our intake nurse although I did not see it. We did a standard total contact cast change on the left foot we have been applying silver alginate to the wound here. 06/10/19-Area on the right  foot per dimensions slightly better, the left foot has still some depth applying silver alginate to the wound with TCC 06/13/19-Patient here for Casting to Left foot 6/1; patient had the total contact cast were placed on the left foot. Noted some swelling in her right leg although the dimensions of the wound on the right foot are better. We have been using PolyMem on the right and silver alginate on the left under the cast 6/7; we continue to make nice progress in both  her plantar feet wound. We are putting the total contact cast on the left. Surprisingly in spite of this the area on the right medial foot in the middle of her Charcot deformities actually is making progress as well 07/01/2019 upon evaluation today patient actually appears to be doing quite well with regard to her wounds on the feet compared to when I saw her last that has been quite some time. With that being said I do believe the total contact cast has been beneficial in this left foot and she has a lot of signs of improvement she does have some callus overhanging which is caused a little bit of a ledge and an issue here. I do believe that we may be able to do something to help in that regard. Fortunately otherwise the wound seems to be progressing quite nicely. 6/21; the patient's area on the left foot that we are currently casting is just about closed. The area on the right medial foot has a healthy looking surface but may be not changed as much from last week in terms of surface area. We are using silver alginate on the right and polymen Ag under the cast 6/28; only a small open area on the left foot remains. Using silver alginate under a total contact cast. She has a diabetic shoe I have asked her to bring that for next week where this may be closed. The area on the right foot with a Charcot deformity is also measuring slightly smaller. We are using PolyMem Ag here 7/6; left foot is totally closed. She does not require a total  contact cast. She will graduate to her own diabetic shoe. ooThe area on the right foot with a Charcot deformity measuring about the same. We have been using polymen under compression wraps. She did not do well in this area with a total contact cast 7/13; left foot remains intact. She is skillfully covering this area with foam and using her diabetic shoe. Slight area improvement of the right foot wound. Still rolled senescent looking edges. We have been using polymen 7/19; left foot remains intact. Wound measuring slightly smaller on the right foot in the middle of the Charcot deformity. We have been using PolyMem Ag 7/26; per the patient left foot remains intact. The wound on the right foot looks slightly smaller but dimensions are measuring the same. We have been using polymen Ag 8/9-Patient back after 2 weeks, wound of the right foot again looking slightly smaller, continuing to use PolyMem silver 8/16; patient has made some minor improvements in wound area on the right foot. Using polymen Ag. The left foot remains healed 8/23; continued slight improvement in dimensions. Rolled edges around the wound edge noted. We have been using polymen Ag. Patient offloading with a surgical shoe and crutches 8/30; no change in surface area. Thick rolled skin and subcutaneous tissue around the edges debris on the wound surface. We have been using polymen Ag initially with some improvement however stalled on the last 2 weeks in terms of surface area 9/13; surface area is smaller still using polymen Ag. Thick rolled skin and subcutaneous tissue around the edges requiring debridement. 9/20; again surface area is improved using polymen Ag however each week she develops thick rolled skin and subcutaneous tissue around the wound edges requiring debridement. She is offloading this using crutches 9/27; we've been using PolyMem AG. Wound does not look any different from last week. Raised edges of callus skin and  subcutaneous tissue around the wound  bed. 10/5; we been using polymen Ag. No ability to put a total contact cast on today in the clinic. Wound is about the same still rolled edges of thick callus around the margins 10/18; we put her in a total contact cast last week things did not go well. Not only does the wound looked worse macerated angry with thick callus and some warmth around it. She had another wound above this and one on the anterior right leg. I am not totally certain why this happened. She has not been systemically unwell Objective Constitutional Sitting or standing Blood Pressure is within target range for patient.. Pulse regular and within target range for patient.Marland Kitchen Respirations regular, non-labored and within target range.. Temperature is normal and within the target range for the patient.Marland Kitchen Appears in no distress. Vitals Time Taken: 2:01 PM, Height: 68 in, Source: Stated, Weight: 265 lbs, Source: Stated, BMI: 40.3, Temperature: 98 F, Pulse: 65 bpm, Respiratory Rate: 18 breaths/min, Blood Pressure: 105/67 mmHg, Capillary Blood Glucose: 134 mg/dl. General Notes: glucose per pt report this am General Notes: Wound exam; right medial plantar foot not in good shape. Thick callus skin macerated somewhat angry and warm. Wonda Horner this she had a cast injury wound also a cast injury wound on the right anterior lower leg Integumentary (Hair, Skin) Wound #12 status is Open. Original cause of wound was Trauma. The wound is located on the Right,Medial Malleolus. The wound measures 1.8cm length x 1cm width x 0.1cm depth; 1.414cm^2 area and 0.141cm^3 volume. There is Fat Layer (Subcutaneous Tissue) exposed. There is no tunneling or undermining noted. There is a medium amount of serosanguineous drainage noted. The wound margin is flat and intact. There is large (67-100%) red, pink granulation within the wound bed. There is a small (1-33%) amount of necrotic tissue within the wound bed including  Adherent Slough. Wound #13 status is Open. Original cause of wound was Trauma. The wound is located on the Right,Anterior Lower Leg. The wound measures 1cm length x 1.2cm width x 0.1cm depth; 0.942cm^2 area and 0.094cm^3 volume. There is Fat Layer (Subcutaneous Tissue) exposed. There is no tunneling or undermining noted. There is a medium amount of serosanguineous drainage noted. The wound margin is flat and intact. There is large (67-100%) red, pink granulation within the wound bed. There is a small (1-33%) amount of necrotic tissue within the wound bed including Adherent Slough. Wound #9 status is Open. Original cause of wound was Blister. The wound is located on the Right,Medial Foot. The wound measures 1.3cm length x 1.3cm width x 0.4cm depth; 1.327cm^2 area and 0.531cm^3 volume. There is Fat Layer (Subcutaneous Tissue) exposed. There is no tunneling or undermining noted. There is a large amount of serosanguineous drainage noted. The wound margin is thickened. There is large (67-100%) red granulation within the wound bed. There is no necrotic tissue within the wound bed. Assessment Active Problems ICD-10 Type 2 diabetes mellitus with foot ulcer Non-pressure chronic ulcer of other part of right foot limited to breakdown of skin Type 2 diabetes mellitus with diabetic polyneuropathy Charcot's joint, right ankle and foot Procedures Wound #9 Pre-procedure diagnosis of Wound #9 is a Diabetic Wound/Ulcer of the Lower Extremity located on the Right,Medial Foot .Severity of Tissue Pre Debridement is: Fat layer exposed. There was a Excisional Skin/Subcutaneous Tissue Debridement with a total area of 1.69 sq cm performed by [name], MD. With the following instrument(s): Curette to remove Viable and Non-Viable tissue/material. Material removed includes Callus and Subcutaneous Tissue and. 1  specimen was taken by a Swab and sent to the lab per facility protocol. A time out was conducted at  15:02, prior to the start of the procedure. A Minimum amount of bleeding was controlled with Pressure. The procedure was tolerated well with a pain level of 0 throughout and a pain level of 0 following the procedure. Post Debridement Measurements: 1.3cm length x 1.3cm width x 0.4cm depth; 0.531cm^3 volume. Character of Wound/Ulcer Post Debridement is improved. Severity of Tissue Post Debridement is: Fat layer exposed. Post procedure Diagnosis Wound #9: Same as Pre-Procedure Pre-procedure diagnosis of Wound #9 is a Diabetic Wound/Ulcer of the Lower Extremity located on the Right,Medial Foot . There was a Three Layer Compression Therapy Procedure by Zandra Abts, RN. Post procedure Diagnosis Wound #9: Same as Pre-Procedure Wound #12 Pre-procedure diagnosis of Wound #12 is a Diabetic Wound/Ulcer of the Lower Extremity located on the Right,Medial Malleolus . There was a Three Layer Compression Therapy Procedure by Zandra Abts, RN. Post procedure Diagnosis Wound #12: Same as Pre-Procedure Wound #13 Pre-procedure diagnosis of Wound #13 is a Diabetic Wound/Ulcer of the Lower Extremity located on the Right,Anterior Lower Leg . There was a Three Layer Compression Therapy Procedure by Zandra Abts, RN. Post procedure Diagnosis Wound #13: Same as Pre-Procedure Plan Follow-up Appointments: Return Appointment in 1 week. Dressing Change Frequency: Do not change entire dressing for one week. - all wounds Wound Cleansing: May shower with protection. - use cast protector Primary Wound Dressing: Wound #12 Right,Medial Malleolus: Calcium Alginate with Silver Wound #13 Right,Anterior Lower Leg: Calcium Alginate with Silver Wound #9 Right,Medial Foot: Calcium Alginate with Silver Secondary Dressing: Wound #9 Right,Medial Foot: Foam - foam donut ABD pad Zetuvit or Kerramax Wound #12 Right,Medial Malleolus: Dry Gauze Wound #13 Right,Anterior Lower Leg: Dry Gauze Edema Control: 3 Layer  Compression System - Right Lower Extremity Avoid standing for long periods of time Elevate legs to the level of the heart or above for 30 minutes daily and/or when sitting, a frequency of: - throughout the day Exercise regularly Off-Loading: Open toe surgical shoe to: - right foot Laboratory ordered were: Anerobic culture - right medial foot 1. We applied silver alginate to all wound areas 2. Foam/Kerramax and we put her in 3 layer compression. 3. I did not give her empiric antibiotics 4. Clearly this foot does not do well in a total contact cast not be reluctant to do this again Electronic Signature(s) Signed: 11/05/2019 4:22:08 PM By: Baltazar Najjar MD Entered By: Baltazar Najjar on 11/04/2019 15:31:49 -------------------------------------------------------------------------------- SuperBill Details Patient Name: Date of Service: [name], [name] 11/04/2019 Medical Record Number: [id number] Patient Account Number: [id number] Date of Birth/Sex: Treating RN: 1955/02/15 (64 y.o. [name] Primary Care Provider: MA SO UDI, [place] MA LSA DA T Other Clinician: Referring Provider: Treating Provider/Extender: Abe People, ELHA MA LSA DA T Weeks in Treatment: 40 Diagnosis Coding ICD-10 Codes Code Description E11.621 Type 2 diabetes mellitus with foot ulcer L97.511 Non-pressure chronic ulcer of other part of right foot limited to breakdown of skin E11.42 Type 2 diabetes mellitus with diabetic polyneuropathy M14.671 Charcot's joint, right ankle and foot Facility Procedures CPT4 Code: [id number] ICD E L Description: 11042 - DEB SUBQ TISSUE 20 SQ CM/< -10 Diagnosis Description 11.621 Type 2 diabetes mellitus with foot ulcer 97.511 Non-pressure chronic ulcer of other part of right foot limited to breakdown of ski Modifier: n Quantity: 1 Physician Procedures : CPT4 Code Description Modifier [id number] 11042 - WC PHYS SUBQ TISS  20 SQ CM ICD-10 Diagnosis Description E11.621  Type 2 diabetes mellitus with foot ulcer L97.511 Non-pressure chronic ulcer of other part of right foot limited to breakdown of skin Quantity: 1 Electronic Signature(s) Signed: 11/05/2019 4:22:08 PM By: Baltazar Najjar MD Entered By: Baltazar Najjar on 11/04/2019 15:32:19

## 2019-11-10 LAB — AEROBIC CULTURE W GRAM STAIN (SUPERFICIAL SPECIMEN): Gram Stain: NONE SEEN

## 2019-11-11 ENCOUNTER — Encounter (HOSPITAL_BASED_OUTPATIENT_CLINIC_OR_DEPARTMENT_OTHER): Payer: Medicare Other | Admitting: Internal Medicine

## 2019-11-11 ENCOUNTER — Other Ambulatory Visit: Payer: Self-pay

## 2019-11-11 DIAGNOSIS — E11621 Type 2 diabetes mellitus with foot ulcer: Secondary | ICD-10-CM | POA: Diagnosis not present

## 2019-11-11 NOTE — Progress Notes (Signed)
[name] ([id number]) Visit Report for 11/11/2019 Debridement Details Patient Name: Date of Service: [name] 11/11/2019 1:45 PM Medical Record Number: [id number] Patient Account Number: [id number] Date of Birth/Sex: Treating RN: 01-02-1956 (64 y.o. [name] Primary Care Provider: MA SO UDI, [place] MA LSA DA T Other Clinician: Referring Provider: Treating Provider/Extender: Baltazar Najjar MA SO UDI, ELHA MA LSA DA T Weeks in Treatment: 56 Debridement Performed for Assessment: Wound #9 Right,Medial Foot Performed By: Physician [name], MD Debridement Type: Debridement Severity of Tissue Pre Debridement: Fat layer exposed Level of Consciousness (Pre-procedure): Awake and Alert Pre-procedure Verification/Time Out Yes - 15:04 Taken: Start Time: 15:04 T Area Debrided (L x W): otal 1.4 (cm) x 1.3 (cm) = 1.82 (cm) Tissue and other material debrided: Viable, Non-Viable, Callus, Subcutaneous Level: Skin/Subcutaneous Tissue Debridement Description: Excisional Instrument: Curette Bleeding: Moderate Hemostasis Achieved: Silver Nitrate End Time: 15:05 Procedural Pain: 0 Post Procedural Pain: 0 Response to Treatment: Procedure was tolerated well Level of Consciousness (Post- Awake and Alert procedure): Post Debridement Measurements of Total Wound Length: (cm) 1.4 Width: (cm) 1.3 Depth: (cm) 0.4 Volume: (cm) 0.572 Character of Wound/Ulcer Post Debridement: Improved Severity of Tissue Post Debridement: Fat layer exposed Post Procedure Diagnosis Same as Pre-procedure Electronic Signature(s) Signed: 11/11/2019 5:02:41 PM By: Zandra Abts RN, BSN Signed: 11/11/2019 5:10:17 PM By: Baltazar Najjar MD Entered By: Zandra Abts on 11/11/2019 15:05:40 -------------------------------------------------------------------------------- HPI Details Patient Name: Date of Service: [name], [name] 11/11/2019 1:45 PM Medical Record Number:  [id number] Patient Account Number: [id number] Date of Birth/Sex: Treating RN: [birth year] (64 y.o. [name] Primary Care Provider: MA SO UDI, [place] MA LSA DA T Other Clinician: Referring Provider: Treating Provider/Extender: Baltazar Najjar MA SO UDI, ELHA MA LSA DA T Weeks in Treatment: 98 History of Present Illness HPI Description: 64 yrs old bf here for follow up recurrent left charcot foot ulcer. She has DM. She is not a smoker. She states a blister developed 3 months ago at site of previous breakdown from 1 year ago. It was debrided at the podiatrist's office. She went to the ER and then sent here. She denies pain. Xray was negative for osteo. Culture from this clinic negative. 09/08/[name] to [name] for Charcot foot. Seen by him and MRI scheduled 09/19/14. Prior silver collagen but this was not ordered last visit, patient has been rinsing with saline alone. Re institute silver collagen every other day. F/u 2 weeks with [name] as Labor Day holiday. Supplies ordered. 09/25/14; this is a patient with a Charcot foot on the left. she has a wound over the plantar aspect of her left calcaneus. She has been seen by [name] of orthopedic surgery and has had an MRI. She is apparently going within the next week or 2 for corrective surgery which will involve an external fixator. I don't have any of the details of this. 10/06/14; The patient is a 64 yrs old bf with a left Charcot foot and ulcer on the plantar. 12/01/14 Returns post surgery with [name]. Has follow up visit later this week with him, currently in facility and NWB over this extremity. States no drainage from foot and no current wound care. On exam callus and scab in place, suspect healed. Has external fixator in place. Will defer to [name] for management, ok to place moisturizing cream over scabs and callus and she will call if she requires follow up here. READMISSION 01/28/2019 Patient was in  the  clinic in 2016 for a wound on the plantar aspect of her left calcaneus. Predominantly cared for by [name] she was referred to [name] and had corrective surgery for the position of her left ankle I believe. She had previously been here in 2015 and then prior to that in 2011 2012 that I do not have these records. More recently she has developed fairly extensive wounds on the right medial foot, left lateral foot and a small area on the right medial great toe. Right medial foot wound has been there for 6 to 7 months, left foot wound for 2 to 3 months and the right great toe only over the last few weeks. She has been followed by [name] at Dr. Laverta [place] office it sounds as though she has been undergoing callus paring and silver nitrate. The patient has been washing these off with soap and water and plan applying dry dressing Past medical history, type 2 diabetes well controlled with a recent hemoglobin A1c of 7 on 11/16, type 2 diabetes with peripheral neuropathy, previous left Charcot joint surgery bilateral Charcot joints currently, stage III chronic renal failure, hypertension, obstructive sleep apnea, history of MRSA and gastroesophageal reflux. 1/18; this is a patient with bilateral Charcot feet. Plain x-rays that I did of both of these wounds that are either at bone or precariously close to bone were done. On the right foot this showed possible lytic destruction involving the anterior aspect of the talus which may represent a wound osteomyelitis. An MRI was recommended. On the left there was no definite lytic destruction although the wound is deep undermining's and I think probably requires an MRI on this basis in any case 1/25; patient was supposed to have her MRI last Friday of her bilateral feet however they would not do it because there was silver alginate in her dressings, will replace with calcium alginate today and will see if we can get her done today 2/1; patient did not get  her MRIs done last week because of issues with the MRI department receiving orders to do bilateral feet. She has 2 deep wounds in the setting of Charcot feet deformities bilaterally. Both of them at one point had exposed bone although I had trouble demonstrating that today. I am not sure that we can offload these very aggressively as I watched her leave the clinic last week her gait is very narrow and unsteady. 2/15; the MRI of her right foot did not show osteomyelitis potentially osteonecrosis of the talus. 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 The wounds are a large wound on the right medial foot and on the left a substantial wound on the left lateral plantar calcaneus. We have been using silver alginate. Ultimately I think we need to consider a total contact cast on the right while we work through the possibility of osteomyelitis in the left. I watched her walk out with her crutches I have some trepidation about the ability to walk with the cast on her right foot. Nevertheless with her Charcot deformities I do not think there is a way to heal these short of offloading them aggressively. 2/22; the culture of the bone in the left foot that I did last week showed a few Citrobacter Koseri as well as some streptococcal species. In my attempted bone for pathology did not actually yield a lot of bone the pathologist did not identify any osteomyelitis. 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 Is promised today and aggressive debridement of the right foot and a total contact cast which surprisingly she seemed to tolerate quite well 03/13/2019 upon evaluation today patient appears to be doing well with her total contact cast she is here for the first cast change. She had no areas of rubbing or any other  complications at this point. Overall things are doing quite well. 2/26; the patient was kindly seen by Dr. Orvan Falconer of infectious disease on 2/25. She is now on IV vancomycin PICC line placement in 2 days and oral Flagyl. This was in response to her MRI showing osteomyelitis of the left calcaneus concerning for osteomyelitis. We have been putting her right leg in a total contact cast. Our nurses report drainage. She is tolerating the total contact cast well. 3/4; the patient is started her IV antibiotics and is taking IV vancomycin and oral Flagyl. She appears to be tolerating these both well. This is for osteomyelitis involving the left calcaneus. The area on the right in the setting of bilateral Charcot deformities did not have any bone infection on MRI. We put a total contact cast on her with silver alginate as the primary dressing and she returns today in follow-up. Per our intake nurse there was too much drainage to leave this on all week therefore we will bring her back for an early change 3/8; follow-up total contact cast she is still having a lot of drainage probably too much drainage from the right foot wound will week with the same cast. She will be back on Thursday. The area on the left looks somewhat better 3/11; patient here for a total contact cast change. 3/15; patient came in for wound care evaluation. She is still on IV vancomycin and oral Flagyl for osteomyelitis in the left foot. The area on the right foot we are putting in a total contact cast. 3/18 for total contact cast change on the right foot 3/22; the patient was here for wound review today. The area on the right foot is slightly smaller in terms of surface area. However the surface of the wound is very gritty and hyper granulated. More problematically the area on the plantar left foot has increased depth indeed in the [name] of this it probes to bone. We have been using Hydrofera Blue in both areas. She is on antibiotics as  prescribed by infectious disease IV vancomycin and oral Flagyl 3/25; the patient is in for total contact cast change. Our intake nurse was concerned about the amount of drainage which soaked right to the multiple layers we are putting on this. Wondered about options. The wound bed actually looks as healthy as this is looked although there may not be a lot of change in dimensions things are looking a lot better. If we are going to heal this woman's wound which is in the middle of her right Charcot foot this is going to need to be offloaded. There are not many alternatives 3/29; still not a lot of improvement although the surface of the right wound looks better. We have been using Hydrofera Blue 04/17/2019 upon evaluation today patient appears to be doing well with a total contact cast. With actually having to change this twice a week. She saw [name] for wound care earlier in the week this is for the cast change today. That is  due to the fact that she has significant drainage. Overall the wound is been looking excellent however. 4/5; we continue to put a total contact cast on this patient with Hydrofera Blue. Still a lot of drainage which precludes a simple weekly change or changing this twice a week. 4/8; total contact cast changed today. Apparently the wound looks better per our intake nurse 4/12; patient here for wound care evaluation. Wounds on the right foot have not changed in dimension none about a month left foot looks similar. Apparently her antibiotics that we are giving her for osteomyelitis in the left foot complete on Wednesday. We have been using Hydrofera Blue on the right foot under a total contact cast. Nurses report greenish drainage although I think this may be discoloration from the [facility] 4/15; back for cast change wounds look quite good although still moderate amount of drainage. T contact cast reapplied otal 4/19;. Wound evaluation. Wound on the right foot is smaller  surface looks healthy. There is still the divot in the middle part of this wound but I could not feel any bone. Area on the left foot also looks better She has completed her antibiotics but still has the PICC line in. 4/23; patient comes back for a total contact cast change this was done in the standard fashion no issues 4/26; wound measures slightly larger on the right foot which is disappointing. She sees Dr. Orvan Falconer with regards to her antibiotics on Wednesday. I believe she is on vancomycin IV and oral Flagyl. 4/29; in for her routine total contact cast change. She saw Dr. Orvan Falconer this morning. He is asking about antibiotic continuation I will be in touch with him. I did not look at her wound today 5/3; patient saw Dr. Orvan Falconer on 4/28. I did send him a note asking about the continuation of perhaps an oral regimen. I have not heard back. She still has an elevated sedimentation rate at 81. Her wounds are smaller. 5/6; in for her total contact cast change. We did not look at the wound today. She has seen Dr. Orvan Falconer and is now on oral Keflex and Flagyl. She seems to be tolerating these well 5/10; she had her PICC line removed. The wound on the right foot after some initial improvement has not made any improvement even with the total contact cast. Still having a lot of drainage. Likewise the area on the left foot really is not any better either 5/14; in for a cast change today. We will look at the right foot on Monday. If this is not making any progress I am going to try to cast the left foot. 5/17; the wound on the right foot is absolutely no different. I am going to put ointment on this area and change the cast of the left foot. We have been using silver alginate there 5/21; apparently the area on the right foot was better per our intake nurse although I did not see it. We did a standard total contact cast change on the left foot we have been applying silver alginate to the wound  here. 06/10/19-Area on the right foot per dimensions slightly better, the left foot has still some depth applying silver alginate to the wound with TCC 06/13/19-Patient here for Casting to Left foot 6/1; patient had the total contact cast were placed on the left foot. Noted some swelling in her right leg although the dimensions of the wound on the right foot are better. We have been using PolyMem on  the right and silver alginate on the left under the cast 6/7; we continue to make nice progress in both her plantar feet wound. We are putting the total contact cast on the left. Surprisingly in spite of this the area on the right medial foot in the middle of her Charcot deformities actually is making progress as well 07/01/2019 upon evaluation today patient actually appears to be doing quite well with regard to her wounds on the feet compared to when I saw her last that has been quite some time. With that being said I do believe the total contact cast has been beneficial in this left foot and she has a lot of signs of improvement she does have some callus overhanging which is caused a little bit of a ledge and an issue here. I do believe that we may be able to do something to help in that regard. Fortunately otherwise the wound seems to be progressing quite nicely. 6/21; the patient's area on the left foot that we are currently casting is just about closed. The area on the right medial foot has a healthy looking surface but may be not changed as much from last week in terms of surface area. We are using silver alginate on the right and polymen Ag under the cast 6/28; only a small open area on the left foot remains. Using silver alginate under a total contact cast. She has a diabetic shoe I have asked her to bring that for next week where this may be closed. The area on the right foot with a Charcot deformity is also measuring slightly smaller. We are using PolyMem Ag here 7/6; left foot is totally closed.  She does not require a total contact cast. She will graduate to her own diabetic shoe. The area on the right foot with a Charcot deformity measuring about the same. We have been using polymen under compression wraps. She did not do well in this area with a total contact cast 7/13; left foot remains intact. She is skillfully covering this area with foam and using her diabetic shoe. Slight area improvement of the right foot wound. Still rolled senescent looking edges. We have been using polymen 7/19; left foot remains intact. Wound measuring slightly smaller on the right foot in the middle of the Charcot deformity. We have been using PolyMem Ag 7/26; per the patient left foot remains intact. The wound on the right foot looks slightly smaller but dimensions are measuring the same. We have been using polymen Ag 8/9-Patient back after 2 weeks, wound of the right foot again looking slightly smaller, continuing to use PolyMem silver 8/16; patient has made some minor improvements in wound area on the right foot. Using polymen Ag. The left foot remains healed 8/23; continued slight improvement in dimensions. Rolled edges around the wound edge noted. We have been using polymen Ag. Patient offloading with a surgical shoe and crutches 8/30; no change in surface area. Thick rolled skin and subcutaneous tissue around the edges debris on the wound surface. We have been using polymen Ag initially with some improvement however stalled on the last 2 weeks in terms of surface area 9/13; surface area is smaller still using polymen Ag. Thick rolled skin and subcutaneous tissue around the edges requiring debridement. 9/20; again surface area is improved using polymen Ag however each week she develops thick rolled skin and subcutaneous tissue around the wound edges requiring debridement. She is offloading this using crutches 9/27; we've been using PolyMem AG. Wound  does not look any different from last week. Raised edges  of callus skin and subcutaneous tissue around the wound bed. 10/5; we been using polymen Ag. No ability to put a total contact cast on today in the clinic. Wound is about the same still rolled edges of thick callus around the margins 10/18; we put her in a total contact cast last week things did not go well. Not only does the wound looked worse macerated angry with thick callus and some warmth around it. She had another wound above this and one on the anterior right leg. I am not totally certain why this happened. She has not been systemically unwell 10/25; culture from last week grew Pseudomonas and MRSA. My experience with this MRSA is usually a true infection and Pseudomonas a colonizer I am going to put her on doxycycline today we put gentamicin on the wound surface. Continuing with silver alginate Electronic Signature(s) Signed: 11/11/2019 5:10:17 PM By: Baltazar Najjar MD Entered By: Baltazar Najjar on 11/11/2019 15:33:53 -------------------------------------------------------------------------------- Physical Exam Details Patient Name: Date of Service: [name], [name] 11/11/2019 1:45 PM Medical Record Number: [id number] Patient Account Number: [id number] Date of Birth/Sex: Treating RN: October 09, 1955 (64 y.o. [name] Primary Care Provider: MA SO UDI, [place] MA LSA DA T Other Clinician: Referring Provider: Treating Provider/Extender: Baltazar Najjar MA SO UDI, ELHA MA LSA DA T Weeks in Treatment: 49 Constitutional Sitting or standing Blood Pressure is within target range for patient.. Pulse regular and within target range for patient.Marland Kitchen Respirations regular, non-labored and within target range.. Temperature is normal and within the target range for the patient.Marland Kitchen Appears in no distress. Notes Wound exam; right medial plantar foot looks somewhat better than last week. I debrided the edges of this again of callus thick skin and subcutaneous tissue hemostasis with silver nitrate.  The cast injury area on her ankle and on the lower leg superiorly looks stable. Electronic Signature(s) Signed: 11/11/2019 5:10:17 PM By: Baltazar Najjar MD Entered By: Baltazar Najjar on 11/11/2019 15:36:03 -------------------------------------------------------------------------------- Physician Orders Details Patient Name: Date of Service: [name], [name] 11/11/2019 1:45 PM Medical Record Number: [id number] Patient Account Number: [id number] Date of Birth/Sex: Treating RN: 09-07-1955 (64 y.o. [name] Primary Care Provider: MA SO UDI, [place] MA LSA DA T Other Clinician: Referring Provider: Treating Provider/Extender: Baltazar Najjar MA SO UDI, ELHA MA LSA DA T Weeks in Treatment: 3 Verbal / Phone Orders: No Diagnosis Coding ICD-10 Coding Code Description E11.621 Type 2 diabetes mellitus with foot ulcer L97.511 Non-pressure chronic ulcer of other part of right foot limited to breakdown of skin E11.42 Type 2 diabetes mellitus with diabetic polyneuropathy M14.671 Charcot's joint, right ankle and foot Follow-up Appointments Return Appointment in 1 week. Dressing Change Frequency Do not change entire dressing for one week. - all wounds Wound Cleansing May shower with protection. - use cast protector Primary Wound Dressing Wound #12 Right,Medial Malleolus Calcium A lginate with Silver Other: - Gentamicin ointment on wound bed, under alginate Wound #13 Right,Anterior Lower Leg Calcium Alginate with Silver Wound #9 Right,Medial Foot Calcium Alginate with Silver Secondary Dressing Wound #12 Right,Medial Malleolus Dry Gauze Wound #13 Right,Anterior Lower Leg Dry Gauze Wound #9 Right,Medial Foot Foam - foam donut ABD pad Zetuvit or Kerramax Edema Control 3 Layer Compression System - Right Lower Extremity Avoid standing for long periods of time Elevate legs to the level of the heart or above for 30 minutes daily and/or when sitting, a frequency of: - throughout the  day  Exercise regularly Off-Loading Open toe surgical shoe to: - right foot Electronic Signature(s) Signed: 11/11/2019 5:02:41 PM By: Zandra Abts RN, BSN Signed: 11/11/2019 5:10:17 PM By: Baltazar Najjar MD Entered By: Zandra Abts on 11/11/2019 15:04:12 -------------------------------------------------------------------------------- Problem List Details Patient Name: Date of Service: [name], [name] 11/11/2019 1:45 PM Medical Record Number: [id number] Patient Account Number: [id number] Date of Birth/Sex: Treating RN: 1955/04/14 (64 y.o. [name] Primary Care Provider: MA SO UDI, Judene Companion MA LSA DA T Other Clinician: Referring Provider: Treating Provider/Extender: Abe People, ELHA MA LSA DA T Weeks in Treatment: 41 Active Problems ICD-10 Encounter Code Description Active Date MDM Diagnosis E11.621 Type 2 diabetes mellitus with foot ulcer 01/28/2019 No Yes L97.511 Non-pressure chronic ulcer of other part of right foot limited to breakdown of 01/28/2019 No Yes skin E11.42 Type 2 diabetes mellitus with diabetic polyneuropathy 01/28/2019 No Yes M14.671 Charcot's joint, right ankle and foot 01/28/2019 No Yes Inactive Problems ICD-10 Code Description Active Date Inactive Date L97.524 Non-pressure chronic ulcer of other part of left foot with necrosis of bone 01/28/2019 01/28/2019 L97.514 Non-pressure chronic ulcer of other part of right foot with necrosis of bone 01/28/2019 01/28/2019 V25.366 Charcot's joint, left ankle and foot 01/28/2019 01/28/2019 Y40.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 11/11/2019 1:45 PM Medical Record  Number: [id number] Patient Account Number: [id number] Date of Birth/Sex: Treating RN: [birth year] (64 y.o. [name] Primary Care Provider: MA SO UDI, [place] MA LSA DA T Other Clinician: Referring Provider: Treating Provider/Extender: Baltazar Najjar MA SO UDI, ELHA MA LSA DA T Weeks in Treatment: 41 Subjective History of Present Illness (HPI) 64 yrs old bf here for follow up recurrent left charcot foot ulcer. She has DM. She is not a smoker. She states a blister developed 3 months ago at site of previous breakdown from 1 year ago. It was debrided at the podiatrist's office. She went to the ER and then sent here. She denies pain. Xray was negative for osteo. Culture from this clinic negative. 09/08/14 Referred by [name] to [name] for Charcot foot. Seen by him and MRI scheduled 09/19/14. Prior silver collagen but this was not ordered last visit, patient has been rinsing with saline alone. Re institute silver collagen every other day. F/u 2 weeks with [name] as Labor Day holiday. Supplies ordered. 09/25/14; this is a patient with a Charcot foot on the left. she has a wound over the plantar aspect of her left calcaneus. She has been seen by [name] of orthopedic surgery and has had an MRI. She is apparently going within the next week or 2 for corrective surgery which will involve an external fixator. I don't have any of the details of this. 10/06/14; The patient is a 64 yrs old bf with a left Charcot foot and ulcer on the plantar. 12/01/14 Returns post surgery with [name]. Has follow up visit later this week with him, currently in facility and NWB over this extremity. States no drainage from foot and no current wound care. On exam callus and scab in place, suspect healed. Has external fixator in place. Will defer to [name] for management, ok to place moisturizing cream over scabs and callus and she will call if she requires follow up here. READMISSION 01/28/2019 Patient was  in the clinic in 2016 for a wound on the plantar aspect of her left calcaneus. Predominantly cared for by [name] she was referred to [name] and had corrective surgery for the position of her left ankle I believe. She had previously been here in 2015 and then prior to that in 2011 2012 that I do not have these records. More recently she has developed fairly extensive wounds on the right medial foot, left lateral foot and a small area on the right medial great toe. Right medial foot wound has been there for 6 to 7 months, left foot wound for 2 to 3 months and the right great toe only over the last few weeks. She has been followed by [name] at Dr. Laverta [place] office it sounds as though she has been undergoing callus paring and silver nitrate. The patient has been washing these off with soap and water and plan applying dry dressing Past medical history, type 2 diabetes well controlled with a recent hemoglobin A1c of 7 on 11/16, type 2 diabetes with peripheral neuropathy, previous left Charcot joint surgery bilateral Charcot joints currently, stage III chronic renal failure, hypertension, obstructive sleep apnea, history of MRSA and gastroesophageal reflux. 1/18; this is a patient with bilateral Charcot feet. Plain x-rays that I did of both of these wounds that are either at bone or precariously close to bone were done. On the right foot this showed possible lytic destruction involving the anterior aspect of the talus which may represent a wound osteomyelitis. An MRI was recommended. On the left there was no definite lytic destruction although the wound is deep undermining's and I think probably requires an MRI on this basis in any case 1/25; patient was supposed to have her MRI last Friday of her bilateral feet however they would not do it because there was silver alginate in her dressings, will replace with calcium alginate today and will see if we can get her done today 2/1; patient did  not get her MRIs done last week because of issues with the MRI department receiving orders to do bilateral feet. She has 2 deep wounds in the setting of Charcot feet deformities bilaterally. Both of them at one point had exposed bone although I had trouble demonstrating that today. I am not sure that we can offload these very aggressively as I watched her leave the clinic last week her gait is very narrow and unsteady. 2/15; the MRI of her right foot did not show osteomyelitis potentially osteonecrosis of the talus. 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 The wounds are a large wound on the right medial foot and on the left a substantial wound on the left lateral plantar calcaneus. We have been using silver alginate. Ultimately I think we need to consider a total contact cast on the right while we work through the possibility of osteomyelitis in the left. I watched her walk out with her crutches I have some trepidation about the ability to walk with the cast on her right foot. Nevertheless with her Charcot deformities I do not think there is a way to heal these short of offloading them aggressively. 2/22; the culture of the bone in the left foot that I did last week showed a few Citrobacter Koseri as well as some streptococcal species. In my attempted bone for pathology did not actually yield a lot of bone the pathologist did not identify any osteomyelitis. 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 Is promised today and aggressive debridement of the right foot and a total contact cast which surprisingly she seemed to tolerate quite well 03/13/2019 upon evaluation today patient appears to be doing well with her total contact cast she is here for the first cast change. She had no areas of rubbing or any other  complications at this point. Overall things are doing quite well. 2/26; the patient was kindly seen by Dr. Orvan Falconer of infectious disease on 2/25. She is now on IV vancomycin PICC line placement in 2 days and oral Flagyl. This was in response to her MRI showing osteomyelitis of the left calcaneus concerning for osteomyelitis. We have been putting her right leg in a total contact cast. Our nurses report drainage. She is tolerating the total contact cast well. 3/4; the patient is started her IV antibiotics and is taking IV vancomycin and oral Flagyl. She appears to be tolerating these both well. This is for osteomyelitis involving the left calcaneus. The area on the right in the setting of bilateral Charcot deformities did not have any bone infection on MRI. We put a total contact cast on her with silver alginate as the primary dressing and she returns today in follow-up. Per our intake nurse there was too much drainage to leave this on all week therefore we will bring her back for an early change 3/8; follow-up total contact cast she is still having a lot of drainage probably too much drainage from the right foot wound will week with the same cast. She will be back on Thursday. The area on the left looks somewhat better 3/11; patient here for a total contact cast change. 3/15; patient came in for wound care evaluation. She is still on IV vancomycin and oral Flagyl for osteomyelitis in the left foot. The area on the right foot we are putting in a total contact cast. 3/18 for total contact cast change on the right foot 3/22; the patient was here for wound review today. The area on the right foot is slightly smaller in terms of surface area. However the surface of the wound is very gritty and hyper granulated. More problematically the area on the plantar left foot has increased depth indeed in the [name] of this it probes to bone. We have been using Hydrofera Blue in both areas. She is on antibiotics as  prescribed by infectious disease IV vancomycin and oral Flagyl 3/25; the patient is in for total contact cast change. Our intake nurse was concerned about the amount of drainage which soaked right to the multiple layers we are putting on this. Wondered about options. The wound bed actually looks as healthy as this is looked although there may not be a lot of change in dimensions things are looking a lot better. If we are going to heal this woman's wound which is in the middle of her right Charcot foot this is going to need to be offloaded. There are not many alternatives 3/29; still not a lot of improvement although the surface of the right wound looks better. We have been using Hydrofera Blue 04/17/2019 upon evaluation today patient appears to be doing well with a total contact cast. With actually having to change this twice a week. She saw [name] for wound care earlier in the week this is for the cast change today. That is  due to the fact that she has significant drainage. Overall the wound is been looking excellent however. 4/5; we continue to put a total contact cast on this patient with Hydrofera Blue. Still a lot of drainage which precludes a simple weekly change or changing this twice a week. 4/8; total contact cast changed today. Apparently the wound looks better per our intake nurse 4/12; patient here for wound care evaluation. Wounds on the right foot have not changed in dimension none about a month left foot looks similar. Apparently her antibiotics that we are giving her for osteomyelitis in the left foot complete on Wednesday. We have been using Hydrofera Blue on the right foot under a total contact cast. Nurses report greenish drainage although I think this may be discoloration from the [facility] 4/15; back for cast change wounds look quite good although still moderate amount of drainage. T contact cast reapplied otal 4/19;. Wound evaluation. Wound on the right foot is smaller  surface looks healthy. There is still the divot in the middle part of this wound but I could not feel any bone. Area on the left foot also looks better She has completed her antibiotics but still has the PICC line in. 4/23; patient comes back for a total contact cast change this was done in the standard fashion no issues 4/26; wound measures slightly larger on the right foot which is disappointing. She sees Dr. Orvan Falconer with regards to her antibiotics on Wednesday. I believe she is on vancomycin IV and oral Flagyl. 4/29; in for her routine total contact cast change. She saw Dr. Orvan Falconer this morning. He is asking about antibiotic continuation I will be in touch with him. I did not look at her wound today 5/3; patient saw Dr. Orvan Falconer on 4/28. I did send him a note asking about the continuation of perhaps an oral regimen. I have not heard back. She still has an elevated sedimentation rate at 81. Her wounds are smaller. 5/6; in for her total contact cast change. We did not look at the wound today. She has seen Dr. Orvan Falconer and is now on oral Keflex and Flagyl. She seems to be tolerating these well 5/10; she had her PICC line removed. The wound on the right foot after some initial improvement has not made any improvement even with the total contact cast. Still having a lot of drainage. Likewise the area on the left foot really is not any better either 5/14; in for a cast change today. We will look at the right foot on Monday. If this is not making any progress I am going to try to cast the left foot. 5/17; the wound on the right foot is absolutely no different. I am going to put ointment on this area and change the cast of the left foot. We have been using silver alginate there 5/21; apparently the area on the right foot was better per our intake nurse although I did not see it. We did a standard total contact cast change on the left foot we have been applying silver alginate to the wound  here. 06/10/19-Area on the right foot per dimensions slightly better, the left foot has still some depth applying silver alginate to the wound with TCC 06/13/19-Patient here for Casting to Left foot 6/1; patient had the total contact cast were placed on the left foot. Noted some swelling in her right leg although the dimensions of the wound on the right foot are better. We have been using PolyMem on  the right and silver alginate on the left under the cast 6/7; we continue to make nice progress in both her plantar feet wound. We are putting the total contact cast on the left. Surprisingly in spite of this the area on the right medial foot in the middle of her Charcot deformities actually is making progress as well 07/01/2019 upon evaluation today patient actually appears to be doing quite well with regard to her wounds on the feet compared to when I saw her last that has been quite some time. With that being said I do believe the total contact cast has been beneficial in this left foot and she has a lot of signs of improvement she does have some callus overhanging which is caused a little bit of a ledge and an issue here. I do believe that we may be able to do something to help in that regard. Fortunately otherwise the wound seems to be progressing quite nicely. 6/21; the patient's area on the left foot that we are currently casting is just about closed. The area on the right medial foot has a healthy looking surface but may be not changed as much from last week in terms of surface area. We are using silver alginate on the right and polymen Ag under the cast 6/28; only a small open area on the left foot remains. Using silver alginate under a total contact cast. She has a diabetic shoe I have asked her to bring that for next week where this may be closed. The area on the right foot with a Charcot deformity is also measuring slightly smaller. We are using PolyMem Ag here 7/6; left foot is totally closed.  She does not require a total contact cast. She will graduate to her own diabetic shoe. ooThe area on the right foot with a Charcot deformity measuring about the same. We have been using polymen under compression wraps. She did not do well in this area with a total contact cast 7/13; left foot remains intact. She is skillfully covering this area with foam and using her diabetic shoe. Slight area improvement of the right foot wound. Still rolled senescent looking edges. We have been using polymen 7/19; left foot remains intact. Wound measuring slightly smaller on the right foot in the middle of the Charcot deformity. We have been using PolyMem Ag 7/26; per the patient left foot remains intact. The wound on the right foot looks slightly smaller but dimensions are measuring the same. We have been using polymen Ag 8/9-Patient back after 2 weeks, wound of the right foot again looking slightly smaller, continuing to use PolyMem silver 8/16; patient has made some minor improvements in wound area on the right foot. Using polymen Ag. The left foot remains healed 8/23; continued slight improvement in dimensions. Rolled edges around the wound edge noted. We have been using polymen Ag. Patient offloading with a surgical shoe and crutches 8/30; no change in surface area. Thick rolled skin and subcutaneous tissue around the edges debris on the wound surface. We have been using polymen Ag initially with some improvement however stalled on the last 2 weeks in terms of surface area 9/13; surface area is smaller still using polymen Ag. Thick rolled skin and subcutaneous tissue around the edges requiring debridement. 9/20; again surface area is improved using polymen Ag however each week she develops thick rolled skin and subcutaneous tissue around the wound edges requiring debridement. She is offloading this using crutches 9/27; we've been using PolyMem AG. Wound  does not look any different from last week. Raised  edges of callus skin and subcutaneous tissue around the wound bed. 10/5; we been using polymen Ag. No ability to put a total contact cast on today in the clinic. Wound is about the same still rolled edges of thick callus around the margins 10/18; we put her in a total contact cast last week things did not go well. Not only does the wound looked worse macerated angry with thick callus and some warmth around it. She had another wound above this and one on the anterior right leg. I am not totally certain why this happened. She has not been systemically unwell 10/25; culture from last week grew Pseudomonas and MRSA. My experience with this MRSA is usually a true infection and Pseudomonas a colonizer I am going to put her on doxycycline today we put gentamicin on the wound surface. Continuing with silver alginate Objective Constitutional Sitting or standing Blood Pressure is within target range for patient.. Pulse regular and within target range for patient.Marland Kitchen Respirations regular, non-labored and within target range.. Temperature is normal and within the target range for the patient.Marland Kitchen Appears in no distress. Vitals Time Taken: 2:10 PM, Height: 68 in, Weight: 265 lbs, BMI: 40.3, Temperature: 98.3 F, Pulse: 78 bpm, Respiratory Rate: 18 breaths/min, Blood Pressure: 126/75 mmHg, Capillary Blood Glucose: 130 mg/dl. General Notes: Wound exam; right medial plantar foot looks somewhat better than last week. I debrided the edges of this again of callus thick skin and subcutaneous tissue hemostasis with silver nitrate. The cast injury area on her ankle and on the lower leg superiorly looks stable. Integumentary (Hair, Skin) Wound #12 status is Open. Original cause of wound was Trauma. The wound is located on the Right,Medial Malleolus. The wound measures 1.6cm length x 0.8cm width x 0.1cm depth; 1.005cm^2 area and 0.101cm^3 volume. There is Fat Layer (Subcutaneous Tissue) exposed. There is no tunneling or  undermining noted. There is a medium amount of serosanguineous drainage noted. The wound margin is flat and intact. There is medium (34-66%) red, pink granulation within the wound bed. There is a medium (34-66%) amount of necrotic tissue within the wound bed including Adherent Slough. Wound #13 status is Open. Original cause of wound was Trauma. The wound is located on the Right,Anterior Lower Leg. The wound measures 0.9cm length x 0.9cm width x 0.1cm depth; 0.636cm^2 area and 0.064cm^3 volume. There is Fat Layer (Subcutaneous Tissue) exposed. There is no tunneling or undermining noted. There is a small amount of serosanguineous drainage noted. The wound margin is flat and intact. There is large (67-100%) red, pink granulation within the wound bed. There is a small (1-33%) amount of necrotic tissue within the wound bed including Adherent Slough. Wound #9 status is Open. Original cause of wound was Blister. The wound is located on the Right,Medial Foot. The wound measures 1.4cm length x 1.3cm width x 0.4cm depth; 1.429cm^2 area and 0.572cm^3 volume. There is Fat Layer (Subcutaneous Tissue) exposed. There is no tunneling or undermining noted. There is a large amount of serosanguineous drainage noted. The wound margin is thickened. There is large (67-100%) red granulation within the wound bed. There is no necrotic tissue within the wound bed. General Notes: callus periwound. Assessment Active Problems ICD-10 Type 2 diabetes mellitus with foot ulcer Non-pressure chronic ulcer of other part of right foot limited to breakdown of skin Type 2 diabetes mellitus with diabetic polyneuropathy Charcot's joint, right ankle and foot Procedures Wound #9 Pre-procedure diagnosis of Wound #9  is a Diabetic Wound/Ulcer of the Lower Extremity located on the Right,Medial Foot .Severity of Tissue Pre Debridement is: Fat layer exposed. There was a Excisional Skin/Subcutaneous Tissue Debridement with a total area of  1.82 sq cm performed by [name], MD. With the following instrument(s): Curette to remove Viable and Non-Viable tissue/material. Material removed includes Callus and Subcutaneous Tissue and. No specimens were taken. A time out was conducted at 15:04, prior to the start of the procedure. A Moderate amount of bleeding was controlled with Silver Nitrate. The procedure was tolerated well with a pain level of 0 throughout and a pain level of 0 following the procedure. Post Debridement Measurements: 1.4cm length x 1.3cm width x 0.4cm depth; 0.572cm^3 volume. Character of Wound/Ulcer Post Debridement is improved. Severity of Tissue Post Debridement is: Fat layer exposed. Post procedure Diagnosis Wound #9: Same as Pre-Procedure Pre-procedure diagnosis of Wound #9 is a Diabetic Wound/Ulcer of the Lower Extremity located on the Right,Medial Foot . There was a Three Layer Compression Therapy Procedure by Zandra Abts, RN. Post procedure Diagnosis Wound #9: Same as Pre-Procedure Wound #12 Pre-procedure diagnosis of Wound #12 is a Diabetic Wound/Ulcer of the Lower Extremity located on the Right,Medial Malleolus . There was a Three Layer Compression Therapy Procedure by Zandra Abts, RN. Post procedure Diagnosis Wound #12: Same as Pre-Procedure Wound #13 Pre-procedure diagnosis of Wound #13 is a Diabetic Wound/Ulcer of the Lower Extremity located on the Right,Anterior Lower Leg . There was a Three Layer Compression Therapy Procedure by Zandra Abts, RN. Post procedure Diagnosis Wound #13: Same as Pre-Procedure Plan Follow-up Appointments: Return Appointment in 1 week. Dressing Change Frequency: Do not change entire dressing for one week. - all wounds Wound Cleansing: May shower with protection. - use cast protector Primary Wound Dressing: Wound #12 Right,Medial Malleolus: Calcium Alginate with Silver Other: - Gentamicin ointment on wound bed, under alginate Wound #13 Right,Anterior  Lower Leg: Calcium Alginate with Silver Wound #9 Right,Medial Foot: Calcium Alginate with Silver Secondary Dressing: Wound #12 Right,Medial Malleolus: Dry Gauze Wound #13 Right,Anterior Lower Leg: Dry Gauze Wound #9 Right,Medial Foot: Foam - foam donut ABD pad Zetuvit or Kerramax Edema Control: 3 Layer Compression System - Right Lower Extremity Avoid standing for long periods of time Elevate legs to the level of the heart or above for 30 minutes daily and/or when sitting, a frequency of: - throughout the day Exercise regularly Off-Loading: Open toe surgical shoe to: - right foot 1 I am continue with silver alginate to all wound areas .#2 doxycycline 100 twice daily for 7 days to cover the MRSA cultured from the foot wound 3. 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 11/11/2019 Medical Record Number: [id number] Patient Account Number: [id number] Date of Birth/Sex: Treating RN: [birth year] (64 y.o. [name] Primary Care Provider: MA SO UDI, [place] MA LSA DA T Other Clinician: Referring Provider: Treating Provider/Extender: Abe People, ELHA MA LSA DA T Weeks in Treatment: 68 Diagnosis Coding ICD-10 Codes Code Description E11.621 Type 2 diabetes mellitus with foot ulcer L97.511 Non-pressure chronic ulcer of other part of right foot limited to breakdown of skin E11.42 Type 2 diabetes mellitus with diabetic polyneuropathy M14.671 Charcot's joint, right ankle and foot Facility Procedures Physician Procedures : CPT4 Code Description Modifier [id number] - WC PHYS SUBQ TISS 20  SQ CM ICD-10 Diagnosis Description L97.511 Non-pressure chronic ulcer of other part of  right foot limited to breakdown of skin E11.621 Type 2 diabetes mellitus with foot ulcer Quantity: 1 Electronic Signature(s) Signed: 11/11/2019 5:10:17 PM By: Baltazar Najjar MD Entered By: Baltazar Najjar on 11/11/2019 15:53:27

## 2019-11-12 NOTE — Progress Notes (Signed)
[name], [name] ([id number]) Visit Report for 11/11/2019 Arrival Information Details Patient Name: Date of Service: [name], [name] 11/11/2019 1:45 PM Medical Record Number: [id number] Patient Account Number: [id number] Date of Birth/Sex: Treating RN: 11-Feb-1955 (64 y.o. [name] Primary Care Ketty Bitton: MA SO UDI, [place] MA LSA DA T Other Clinician: Referring Maurion Walkowiak: Treating Cam Dauphin/Extender: Baltazar Najjar MA SO UDI, ELHA MA LSA DA T Weeks in Treatment: 19 Visit Information History Since Last Visit Added or deleted any medications: No Patient Arrived: Crutches Any new allergies or adverse reactions: No Arrival Time: 14:09 Had a fall or experienced change in No Accompanied By: self activities of daily living that may affect Transfer Assistance: None risk of falls: Patient Identification Verified: Yes Signs or symptoms of abuse/neglect since last visito No Secondary Verification Process Completed: Yes Hospitalized since last visit: No Patient Requires Transmission-Based Precautions: No Implantable device outside of the clinic excluding No Patient Has Alerts: No cellular tissue based products placed in the [name] since last visit: Has Dressing in Place as Prescribed: Yes Pain Present Now: No Electronic Signature(s) Signed: 11/12/2019 3:39:47 PM By: Karl Ito Entered By: Karl Ito on 11/11/2019 14:10:07 -------------------------------------------------------------------------------- Compression Therapy Details Patient Name: Date of Service: [name], [name] 11/11/2019 1:45 PM Medical Record Number: [id number] Patient Account Number: [id number] Date of Birth/Sex: Treating RN: 01/07/1956 (64 y.o. [name] Primary Care Elena Davia: MA SO UDI, Judene Companion MA LSA DA T Other Clinician: Referring Vittorio Mohs: Treating Charlsie Fleeger/Extender: Baltazar Najjar MA SO UDI, ELHA MA LSA DA T Weeks in Treatment: 20 Compression Therapy Performed for Wound  Assessment: Wound #12 Right,Medial Malleolus Performed By: Clinician Zandra Abts, RN Compression Type: Three Layer Post Procedure Diagnosis Same as Pre-procedure Electronic Signature(s) Signed: 11/11/2019 5:02:41 PM By: Zandra Abts RN, BSN Entered By: Zandra Abts on 11/11/2019 15:06:00 -------------------------------------------------------------------------------- Compression Therapy Details Patient Name: Date of Service: [name], [name] 11/11/2019 1:45 PM Medical Record Number: [id number] Patient Account Number: [id number] Date of Birth/Sex: Treating RN: [birth year] (64 y.o. [name] Primary Care Allyse Fregeau: MA SO UDI, Judene Companion MA LSA DA T Other Clinician: Referring Brietta Manso: Treating Giannina Bartolome/Extender: Baltazar Najjar MA SO UDI, ELHA MA LSA DA T Weeks in Treatment: 83 Compression Therapy Performed for Wound Assessment: Wound #13 Right,Anterior Lower Leg Performed By: Clinician Zandra Abts, RN Compression Type: Three Layer Post Procedure Diagnosis Same as Pre-procedure Electronic Signature(s) Signed: 11/11/2019 5:02:41 PM By: Zandra Abts RN, BSN Entered By: Zandra Abts on 11/11/2019 15:06:00 -------------------------------------------------------------------------------- Compression Therapy Details Patient Name: Date of Service: [name], [name] 11/11/2019 1:45 PM Medical Record Number: [id number] Patient Account Number: [id number] Date of Birth/Sex: Treating RN: [birth year] (64 y.o. [name] Primary Care Avier Jech: MA SO UDI, Judene Companion MA LSA DA T Other Clinician: Referring Hasani Diemer: Treating Katoya Amato/Extender: Baltazar Najjar MA SO UDI, ELHA MA LSA DA T Weeks in Treatment: 72 Compression Therapy Performed for Wound Assessment: Wound #9 Right,Medial Foot Performed By: Clinician Zandra Abts, RN Compression Type: Three Layer Post Procedure Diagnosis Same as Pre-procedure Electronic Signature(s) Signed: 11/11/2019 5:02:41 PM By: Zandra Abts RN, BSN Entered By: Zandra Abts on 11/11/2019 15:06:00 -------------------------------------------------------------------------------- Encounter Discharge Information Details Patient Name: Date of Service: [name], [name] 11/11/2019 1:45 PM Medical Record Number: [id number] Patient Account Number: [id number] Date of Birth/Sex: Treating RN: 1955-07-03 (64 y.o. Debara Pickett, Millard.Loa Primary Care Amere Bricco: MA SO UDI, Judene Companion MA LSA DA T Other Clinician: Referring Breyer Tejera: Treating Annis Lagoy/Extender: Baltazar Najjar MA SO UDI, ELHA MA LSA DA T Weeks in Treatment: [phone]  Encounter Discharge Information Items Post Procedure Vitals Discharge Condition: Stable Temperature (F): 98.3 Ambulatory Status: Crutches Pulse (bpm): 78 Discharge Destination: Home Respiratory Rate (breaths/min): 18 Transportation: Private Auto Blood Pressure (mmHg): 126/75 Accompanied By: self Schedule Follow-up Appointment: Yes Clinical Summary of Care: Electronic Signature(s) Signed: 11/11/2019 5:04:57 PM By: Shawn Stall Entered By: Shawn Stall on 11/11/2019 15:28:45 -------------------------------------------------------------------------------- Lower Extremity Assessment Details Patient Name: Date of Service: [facility]. 11/11/2019 1:45 PM Medical Record Number: [id number] Patient Account Number: [id number] Date of Birth/Sex: Treating RN: 1955-10-17 (64 y.o. [name] Primary Care Sheralyn Pinegar: MA SO UDI, [place] MA LSA DA T Other Clinician: Referring Briani Maul: Treating Avalie Oconnor/Extender: Baltazar Najjar MA SO UDI, ELHA MA LSA DA T Weeks in Treatment: 41 Edema Assessment Assessed: [Left: No] [Right: Yes] Edema: [Left: N] [Right: o] Calf Left: Right: Point of Measurement: 43 cm From Medial Instep 33 cm Ankle Left: Right: Point of Measurement: 12 cm From Medial Instep 21 cm Vascular Assessment Pulses: Dorsalis Pedis Palpable: [Right:Yes] Electronic Signature(s) Signed:  11/11/2019 5:04:57 PM By: Shawn Stall Entered By: Shawn Stall on 11/11/2019 14:25:07 -------------------------------------------------------------------------------- Multi Wound Chart Details Patient Name: Date of Service: [facility] [name], Adonai J. 11/11/2019 1:45 PM Medical Record Number: [id number] Patient Account Number: [id number] Date of Birth/Sex: Treating RN: 10-01-1955 (64 y.o. [name] Primary Care Preeti Winegardner: MA SO UDI, ELHA MA LSA DA T Other Clinician: Referring Chong January: Treating Chakira Jachim/Extender: Baltazar Najjar MA SO UDI, ELHA MA LSA DA T Weeks in Treatment: 41 Vital Signs Height(in): 68 Capillary Blood Glucose(mg/dl): 828 Weight(lbs): 003 Pulse(bpm): 78 Body Mass Index(BMI): 40 Blood Pressure(mmHg): 126/75 Temperature(F): 98.3 Respiratory Rate(breaths/min): 18 Photos: [12:No Photos Right, Medial Malleolus] [13:No Photos Right, Anterior Lower Leg] [9:No Photos Right, Medial Foot] Wound Location: [12:Trauma] [13:Trauma] [9:Blister] Wounding Event: [12:Diabetic Wound/Ulcer of the Lower] [13:Diabetic Wound/Ulcer of the Lower] [9:Diabetic Wound/Ulcer of the Lower] Primary Etiology: [12:Extremity Glaucoma, Chronic sinus] [13:Extremity Glaucoma, Chronic sinus] [9:Extremity Glaucoma, Chronic sinus] Comorbid History: [12:problems/congestion, Anemia, Sleep problems/congestion, Anemia, Sleep problems/congestion, Anemia, Sleep Apnea, Hypertension, Type II Diabetes, Rheumatoid Arthritis, Neuropathy 11/04/2019] [13:Apnea, Hypertension, Type II Diabetes,  Rheumatoid Arthritis, Neuropathy 11/04/2019] [9:Apnea, Hypertension, Type II Diabetes, Rheumatoid Arthritis, Neuropathy 06/18/2018] Date Acquired: [12:1] [13:1] [9:41] Weeks of Treatment: [12:Open] [13:Open] [9:Open] Wound Status: [12:1.6x0.8x0.1] [13:0.9x0.9x0.1] [9:1.4x1.3x0.4] Measurements L x W x D (cm) [12:1.005] [13:0.636] [9:1.429] A (cm) : rea [12:0.101] [13:0.064] [9:0.572] Volume (cm) : [12:28.90%] [13:32.50%]  [9:87.00%] % Reduction in A [12:rea: 28.40%] [13:31.90%] [9:94.20%] % Reduction in Volume: [12:Grade 1] [13:Grade 1] [9:Grade 2] Classification: [12:Medium] [13:Small] [9:Large] Exudate A mount: [12:Serosanguineous] [13:Serosanguineous] [9:Serosanguineous] Exudate Type: [12:red, brown] [13:red, brown] [9:red, brown] Exudate Color: [12:Flat and Intact] [13:Flat and Intact] [9:Thickened] Wound Margin: [12:Medium (34-66%)] [13:Large (67-100%)] [9:Large (67-100%)] Granulation A mount: [12:Red, Pink] [13:Red, Pink] [9:Red] Granulation Quality: [12:Medium (34-66%)] [13:Small (1-33%)] [9:None Present (0%)] Necrotic A mount: [12:Fat Layer (Subcutaneous Tissue): Yes Fat Layer (Subcutaneous Tissue): Yes Fat Layer (Subcutaneous Tissue): Yes] Exposed Structures: [12:Fascia: No Tendon: No Muscle: No Joint: No Bone: No Small (1-33%)] [13:Fascia: No Tendon: No Muscle: No Joint: No Bone: No Small (1-33%)] [9:Fascia: No Tendon: No Muscle: No Joint: No Bone: No None] Epithelialization: [12:N/A] [13:N/A] [9:Debridement - Excisional] Debridement: Pre-procedure Verification/Time Out N/A [13:N/A] [9:15:04] Taken: [12:N/A] [13:N/A] [9:Callus, Subcutaneous] Tissue Debrided: [12:N/A] [13:N/A] [9:Skin/Subcutaneous Tissue] Level: [12:N/A] [13:N/A] [9:1.82] Debridement A (sq cm): [12:rea N/A] [13:N/A] [9:Curette] Instrument: [12:N/A] [13:N/A] [9:Moderate] Bleeding: [12:N/A] [13:N/A] [9:Silver Nitrate] Hemostasis A chieved: [12:N/A] [13:N/A] [9:0] Procedural Pain: [12:N/A] [13:N/A] [9:0] Post Procedural Pain: [12:N/A] [13:N/A] [9:Procedure was  tolerated well] Debridement Treatment Response: [12:N/A] [13:N/A] [9:1.4x1.3x0.4] Post Debridement Measurements L x W x D (cm) [12:N/A] [13:N/A] [9:0.572] Post Debridement Volume: (cm) [12:N/A] [13:N/A] [9:callus periwound.] Assessment Notes: [12:Compression Therapy] [13:Compression Therapy] [9:Compression Therapy] Procedures Performed: [9:Debridement] Treatment  Notes Wound #12 (Right, Medial Malleolus) 1. Cleanse With Wound Cleanser Soap and water 2. Periwound Care Moisturizing lotion 3. Primary Dressing Applied Calcium Alginate Ag 4. Secondary Dressing Dry Gauze Foam 6. Support Layer Applied 3 layer compression wrap Notes stockinette Wound #13 (Right, Anterior Lower Leg) 1. Cleanse With Wound Cleanser Soap and water 2. Periwound Care Moisturizing lotion 3. Primary Dressing Applied Calcium Alginate Ag 4. Secondary Dressing Dry Gauze Foam 6. Support Layer Applied 3 layer compression wrap Notes stockinette Wound #9 (Right, Medial Foot) 1. Cleanse With Wound Cleanser Soap and water 2. Periwound Care Moisturizing lotion 3. Primary Dressing Applied Calcium Alginate Ag Other primary dressing (specifiy in notes) 4. Secondary Dressing ABD Pad Dry Gauze Foam 6. Support Layer Applied 3 layer compression wrap Notes primary dressing gentamycin. foam donut as secondary. stockinette. Electronic Signature(s) Signed: 11/11/2019 5:02:41 PM By: Zandra Abts RN, BSN Signed: 11/11/2019 5:10:17 PM By: Baltazar Najjar MD Entered By: Baltazar Najjar on 11/11/2019 15:29:52 -------------------------------------------------------------------------------- Multi-Disciplinary Care Plan Details Patient Name: Date of Service: [facility], Kerina J. 11/11/2019 1:45 PM Medical Record Number: [id number] Patient Account Number: [id number] Date of Birth/Sex: Treating RN: May 16, 1955 (64 y.o. [name] Primary Care Avon Mergenthaler: MA SO UDI, Judene Companion MA LSA DA T Other Clinician: Referring Arrin Pintor: Treating Jhanvi Drakeford/Extender: Baltazar Najjar MA SO UDI, ELHA MA LSA DA T Weeks in Treatment: 61 Active Inactive Wound/Skin Impairment Nursing Diagnoses: Impaired tissue integrity Knowledge deficit related to ulceration/compromised skin integrity Goals: Patient/caregiver will verbalize understanding of skin care regimen Date Initiated:  01/28/2019 Target Resolution Date: 11/29/2019 Goal Status: Active Interventions: Assess patient/caregiver ability to obtain necessary supplies Assess patient/caregiver ability to perform ulcer/skin care regimen upon admission and as needed Assess ulceration(s) every visit Provide education on ulcer and skin care Notes: Electronic Signature(s) Signed: 11/11/2019 5:02:41 PM By: Zandra Abts RN, BSN Entered By: Zandra Abts on 11/11/2019 14:46:51 -------------------------------------------------------------------------------- Pain Assessment Details Patient Name: Date of Service: [name], Birdie J. 11/11/2019 1:45 PM Medical Record Number: [id number] Patient Account Number: [id number] Date of Birth/Sex: Treating RN: 03-Mar-1955 (64 y.o. [name] Primary Care Adalena Abdulla: MA SO UDI, [place] MA LSA DA T Other Clinician: Referring Yadira Hada: Treating Kiaira Pointer/Extender: Baltazar Najjar MA SO UDI, ELHA MA LSA DA T Weeks in Treatment: 47 Active Problems Location of Pain Severity and Description of Pain Patient Has Paino No Site Locations Pain Management and Medication Current Pain Management: Electronic Signature(s) Signed: 11/11/2019 5:02:41 PM By: Zandra Abts RN, BSN Signed: 11/12/2019 3:39:47 PM By: Karl Ito Entered By: Karl Ito on 11/11/2019 14:10:37 -------------------------------------------------------------------------------- Patient/Caregiver Education Details Patient Name: Date of Service: Gypsy Decant 10/25/2021andnbsp1:45 PM Medical Record Number: [id number] Patient Account Number: [id number] Date of Birth/Gender: Treating RN: Sep 05, 1955 (64 y.o. [name] Primary Care Physician: MA SO UDI, Judene Companion MA LSA DA T Other Clinician: Referring Physician: Treating Physician/Extender: Baltazar Najjar MA SO UDI, ELHA MA LSA DA T Weeks in Treatment: 52 Education Assessment Education Provided To: Patient Education Topics  Provided Wound/Skin Impairment: Methods: Explain/Verbal Responses: State content correctly [employer]) Signed: 11/11/2019 5:02:41 PM By: Zandra Abts RN, BSN Entered By: Zandra Abts on 11/11/2019 14:47:05 -------------------------------------------------------------------------------- Wound Assessment Details Patient Name: Date of Service: [name], [name] 11/11/2019 1:45 PM Medical Record Number: [id number] Patient Account Number: [id number]  Date of Birth/Sex: Treating RN: [birth year] (64 y.o. [name] Primary Care Amrie Gurganus: MA SO UDI, [place] MA LSA DA T Other Clinician: Referring Dare Spillman: Treating Nisa Decaire/Extender: Baltazar Najjar MA SO UDI, ELHA MA LSA DA T Weeks in Treatment: 41 Wound Status Wound Number: 12 Primary Diabetic Wound/Ulcer of the Lower Extremity Etiology: Wound Location: Right, Medial Malleolus Wound Open Wounding Event: Trauma Status: Date Acquired: 11/04/2019 Comorbid Glaucoma, Chronic sinus problems/congestion, Anemia, Sleep Weeks Of Treatment: 1 History: Apnea, Hypertension, Type II Diabetes, Rheumatoid Arthritis, Clustered Wound: No Neuropathy Wound Measurements Length: (cm) 1.6 Width: (cm) 0.8 Depth: (cm) 0.1 Area: (cm) 1.005 Volume: (cm) 0.101 % Reduction in Area: 28.9% % Reduction in Volume: 28.4% Epithelialization: Small (1-33%) Tunneling: No Undermining: No Wound Description Classification: Grade 1 Wound Margin: Flat and Intact Exudate Amount: Medium Exudate Type: Serosanguineous Exudate Color: red, brown Foul Odor After Cleansing: No Slough/Fibrino Yes Wound Bed Granulation Amount: Medium (34-66%) Exposed Structure Granulation Quality: Red, Pink Fascia Exposed: No Necrotic Amount: Medium (34-66%) Fat Layer (Subcutaneous Tissue) Exposed: Yes Necrotic Quality: Adherent Slough Tendon Exposed: No Muscle Exposed: No Joint Exposed: No Bone Exposed: No Treatment Notes Wound #12 (Right, Medial  Malleolus) 1. Cleanse With Wound Cleanser Soap and water 2. Periwound Care Moisturizing lotion 3. Primary Dressing Applied Calcium Alginate Ag 4. Secondary Dressing Dry Gauze Foam 6. Support Layer Applied 3 layer compression wrap Notes Scientist, research (physical sciences)) Signed: 11/11/2019 5:02:41 PM By: Zandra Abts RN, BSN Signed: 11/11/2019 5:04:57 PM By: Shawn Stall Entered By: Shawn Stall on 11/11/2019 14:25:58 -------------------------------------------------------------------------------- Wound Assessment Details Patient Name: Date of Service: [name], [name] 11/11/2019 1:45 PM Medical Record Number: [id number] Patient Account Number: [id number] Date of Birth/Sex: Treating RN: 1955/05/22 (64 y.o. [name] Primary Care Danish Ruffins: MA SO UDI, [place] MA LSA DA T Other Clinician: Referring Afreen Siebels: Treating Sharunda Salmon/Extender: Baltazar Najjar MA SO UDI, ELHA MA LSA DA T Weeks in Treatment: 41 Wound Status Wound Number: 13 Primary Diabetic Wound/Ulcer of the Lower Extremity Etiology: Wound Location: Right, Anterior Lower Leg Wound Open Wounding Event: Trauma Status: Date Acquired: 11/04/2019 Comorbid Glaucoma, Chronic sinus problems/congestion, Anemia, Sleep Weeks Of Treatment: 1 History: Apnea, Hypertension, Type II Diabetes, Rheumatoid Arthritis, Clustered Wound: No Neuropathy Wound Measurements Length: (cm) 0.9 Width: (cm) 0.9 Depth: (cm) 0.1 Area: (cm) 0.636 Volume: (cm) 0.064 % Reduction in Area: 32.5% % Reduction in Volume: 31.9% Epithelialization: Small (1-33%) Tunneling: No Undermining: No Wound Description Classification: Grade 1 Wound Margin: Flat and Intact Exudate Amount: Small Exudate Type: Serosanguineous Exudate Color: red, brown Foul Odor After Cleansing: No Slough/Fibrino Yes Wound Bed Granulation Amount: Large (67-100%) Exposed Structure Granulation Quality: Red, Pink Fascia Exposed: No Necrotic Amount: Small  (1-33%) Fat Layer (Subcutaneous Tissue) Exposed: Yes Necrotic Quality: Adherent Slough Tendon Exposed: No Muscle Exposed: No Joint Exposed: No Bone Exposed: No Treatment Notes Wound #13 (Right, Anterior Lower Leg) 1. Cleanse With Wound Cleanser Soap and water 2. Periwound Care Moisturizing lotion 3. Primary Dressing Applied Calcium Alginate Ag 4. Secondary Dressing Dry Gauze Foam 6. Support Layer Applied 3 layer compression wrap Notes Scientist, research (physical sciences)) Signed: 11/11/2019 5:02:41 PM By: Zandra Abts RN, BSN Signed: 11/11/2019 5:04:57 PM By: Shawn Stall Entered By: Shawn Stall on 11/11/2019 14:26:28 -------------------------------------------------------------------------------- Wound Assessment Details Patient Name: Date of Service: [name], [name] 11/11/2019 1:45 PM Medical Record Number: [id number] Patient Account Number: [id number] Date of Birth/Sex: Treating RN: 06-23-1955 (64 y.o. [name] Primary Care Nina Mondor: MA SO UDI, ELHA MA LSA DA T Other Clinician:  Referring Alando Colleran: Treating Monna Crean/Extender: Baltazar Najjar MA SO UDI, ELHA MA LSA DA T Weeks in Treatment: 41 Wound Status Wound Number: 9 Primary Diabetic Wound/Ulcer of the Lower Extremity Etiology: Wound Location: Right, Medial Foot Wound Open Wounding Event: Blister Status: Date Acquired: 06/18/2018 Comorbid Glaucoma, Chronic sinus problems/congestion, Anemia, Sleep Weeks Of Treatment: 41 History: Apnea, Hypertension, Type II Diabetes, Rheumatoid Arthritis, Clustered Wound: No Neuropathy Wound Measurements Length: (cm) 1.4 Width: (cm) 1.3 Depth: (cm) 0.4 Area: (cm) 1.429 Volume: (cm) 0.572 % Reduction in Area: 87% % Reduction in Volume: 94.2% Epithelialization: None Tunneling: No Undermining: No Wound Description Classification: Grade 2 Wound Margin: Thickened Exudate Amount: Large Exudate Type: Serosanguineous Exudate Color: red, brown Foul Odor  After Cleansing: No Slough/Fibrino No Wound Bed Granulation Amount: Large (67-100%) Exposed Structure Granulation Quality: Red Fascia Exposed: No Necrotic Amount: None Present (0%) Fat Layer (Subcutaneous Tissue) Exposed: Yes Tendon Exposed: No Muscle Exposed: No Joint Exposed: No Bone Exposed: No Assessment Notes callus periwound. Treatment Notes Wound #9 (Right, Medial Foot) 1. Cleanse With Wound Cleanser Soap and water 2. Periwound Care Moisturizing lotion 3. Primary Dressing Applied Calcium Alginate Ag Other primary dressing (specifiy in notes) 4. Secondary Dressing ABD Pad Dry Gauze Foam 6. Support Layer Applied 3 layer compression wrap Notes primary dressing gentamycin. foam donut as secondary. stockinette. Electronic Signature(s) Signed: 11/11/2019 5:02:41 PM By: Zandra Abts RN, BSN Signed: 11/11/2019 5:04:57 PM By: Shawn Stall Entered By: Shawn Stall on 11/11/2019 14:26:57 -------------------------------------------------------------------------------- Vitals Details Patient Name: Date of Service: [name], [name] J. 11/11/2019 1:45 PM Medical Record Number: [id number] Patient Account Number: [id number] Date of Birth/Sex: Treating RN: 06-21-1955 (64 y.o. [name] Primary Care Sylvana Bonk: MA SO UDI, [place] MA LSA DA T Other Clinician: Referring Katelen Luepke: Treating Soley Harriss/Extender: Baltazar Najjar MA SO UDI, ELHA MA LSA DA T Weeks in Treatment: 83 Vital Signs Time Taken: 14:10 Temperature (F): 98.3 Height (in): 68 Pulse (bpm): 78 Weight (lbs): 265 Respiratory Rate (breaths/min): 18 Body Mass Index (BMI): 40.3 Blood Pressure (mmHg): 126/75 Capillary Blood Glucose (mg/dl): 595 Reference Range: 80 - 120 mg / dl Electronic Signature(s) Signed: 11/12/2019 3:39:47 PM By: Karl Ito Entered By: Karl Ito on 11/11/2019 14:10:31

## 2019-11-18 ENCOUNTER — Other Ambulatory Visit: Payer: Self-pay

## 2019-11-18 ENCOUNTER — Encounter (HOSPITAL_BASED_OUTPATIENT_CLINIC_OR_DEPARTMENT_OTHER): Payer: Medicare Other | Attending: Internal Medicine | Admitting: Internal Medicine

## 2019-11-18 DIAGNOSIS — E1142 Type 2 diabetes mellitus with diabetic polyneuropathy: Secondary | ICD-10-CM | POA: Insufficient documentation

## 2019-11-18 DIAGNOSIS — E11621 Type 2 diabetes mellitus with foot ulcer: Secondary | ICD-10-CM | POA: Diagnosis present

## 2019-11-18 DIAGNOSIS — M14671 Charcot's joint, right ankle and foot: Secondary | ICD-10-CM | POA: Diagnosis not present

## 2019-11-18 DIAGNOSIS — L97524 Non-pressure chronic ulcer of other part of left foot with necrosis of bone: Secondary | ICD-10-CM | POA: Diagnosis not present

## 2019-11-18 NOTE — Progress Notes (Signed)
[name], CRESPIN ([id number]) Visit Report for 11/18/2019 Debridement Details Patient Name: Date of Service: [name] 11/18/2019 1:45 PM Medical Record Number: [id number] Patient Account Number: [id number] Date of Birth/Sex: Treating RN: January 19, 1955 (64 y.o. [name] Primary Care Provider: MA SO UDI, [place] MA LSA DA T Other Clinician: Referring Provider: Treating Provider/Extender: Baltazar Najjar MA SO UDI, ELHA MA LSA DA T Weeks in Treatment: 42 Debridement Performed for Assessment: Wound #9 Right,Medial Foot Performed By: Physician [name], MD Debridement Type: Debridement Severity of Tissue Pre Debridement: Fat layer exposed Level of Consciousness (Pre-procedure): Awake and Alert Pre-procedure Verification/Time Out Yes - 15:04 Taken: Start Time: 15:04 T Area Debrided (L x W): otal 1.2 (cm) x 1.3 (cm) = 1.56 (cm) Tissue and other material debrided: Viable, Non-Viable, Callus, Skin: Epidermis Level: Skin/Epidermis Debridement Description: Selective/Open Wound Instrument: Curette Bleeding: Minimum Hemostasis Achieved: Pressure End Time: 15:06 Procedural Pain: 0 Post Procedural Pain: 0 Response to Treatment: Procedure was tolerated well Level of Consciousness (Post- Awake and Alert procedure): Post Debridement Measurements of Total Wound Length: (cm) 1.2 Width: (cm) 1.3 Depth: (cm) 0.4 Volume: (cm) 0.49 Character of Wound/Ulcer Post Debridement: Improved Severity of Tissue Post Debridement: Fat layer exposed Post Procedure Diagnosis Same as Pre-procedure Electronic Signature(s) Signed: 11/18/2019 5:37:06 PM By: Baltazar Najjar MD Signed: 11/18/2019 5:57:05 PM By: Zandra Abts RN, BSN Entered By: Zandra Abts on 11/18/2019 15:06:33 -------------------------------------------------------------------------------- HPI Details Patient Name: Date of Service: [name], [name] 11/18/2019 1:45 PM Medical Record Number: [id number] Patient  Account Number: [id number] Date of Birth/Sex: Treating RN: 1955/10/21 (64 y.o. [name] Primary Care Provider: MA SO UDI, [place] MA LSA DA T Other Clinician: Referring Provider: Treating Provider/Extender: Baltazar Najjar MA SO UDI, ELHA MA LSA DA T Weeks in Treatment: 75 History of Present Illness HPI Description: 64 yrs old bf here for follow up recurrent left charcot foot ulcer. She has DM. She is not a smoker. She states a blister developed 3 months ago at site of previous breakdown from 1 year ago. It was debrided at the podiatrist's office. She went to the ER and then sent here. She denies pain. Xray was negative for osteo. Culture from this clinic negative. 09/08/14 [address] [name] to [name] for Charcot foot. Seen by him and MRI scheduled 09/19/14. Prior silver collagen but this was not ordered last visit, patient has been rinsing with saline alone. Re institute silver collagen every other day. F/u 2 weeks with [name] as Labor Day holiday. Supplies ordered. 09/25/14; this is a patient with a Charcot foot on the left. she has a wound over the plantar aspect of her left calcaneus. She has been seen by [name] of orthopedic surgery and has had an MRI. She is apparently going within the next week or 2 for corrective surgery which will involve an external fixator. I don't have any of the details of this. 10/06/14; The patient is a 64 yrs old bf with a left Charcot foot and ulcer on the plantar. 12/01/14 Returns post surgery with [name]. Has follow up visit later this week with him, currently in facility and NWB over this extremity. States no drainage from foot and no current wound care. On exam callus and scab in place, suspect healed. Has external fixator in place. Will defer to [name] for management, ok to place moisturizing cream over scabs and callus and she will call if she requires follow up here. READMISSION 01/28/2019 Patient was in  the clinic in 2016 for a  wound on the plantar aspect of her left calcaneus. Predominantly cared for by [name] she was referred to [name] and had corrective surgery for the position of her left ankle I believe. She had previously been here in 2015 and then prior to that in 2011 2012 that I do not have these records. More recently she has developed fairly extensive wounds on the right medial foot, left lateral foot and a small area on the right medial great toe. Right medial foot wound has been there for 6 to 7 months, left foot wound for 2 to 3 months and the right great toe only over the last few weeks. She has been followed by [name] at Dr. Laverta [place] office it sounds as though she has been undergoing callus paring and silver nitrate. The patient has been washing these off with soap and water and plan applying dry dressing Past medical history, type 2 diabetes well controlled with a recent hemoglobin A1c of 7 on 11/16, type 2 diabetes with peripheral neuropathy, previous left Charcot joint surgery bilateral Charcot joints currently, stage III chronic renal failure, hypertension, obstructive sleep apnea, history of MRSA and gastroesophageal reflux. 1/18; this is a patient with bilateral Charcot feet. Plain x-rays that I did of both of these wounds that are either at bone or precariously close to bone were done. On the right foot this showed possible lytic destruction involving the anterior aspect of the talus which may represent a wound osteomyelitis. An MRI was recommended. On the left there was no definite lytic destruction although the wound is deep undermining's and I think probably requires an MRI on this basis in any case 1/25; patient was supposed to have her MRI last Friday of her bilateral feet however they would not do it because there was silver alginate in her dressings, will replace with calcium alginate today and will see if we can get her done today 2/1; patient did not get her MRIs done last  week because of issues with the MRI department receiving orders to do bilateral feet. She has 2 deep wounds in the setting of Charcot feet deformities bilaterally. Both of them at one point had exposed bone although I had trouble demonstrating that today. I am not sure that we can offload these very aggressively as I watched her leave the clinic last week her gait is very narrow and unsteady. 2/15; the MRI of her right foot did not show osteomyelitis potentially osteonecrosis of the talus. 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 The wounds are a large wound on the right medial foot and on the left a substantial wound on the left lateral plantar calcaneus. We have been using silver alginate. Ultimately I think we need to consider a total contact cast on the right while we work through the possibility of osteomyelitis in the left. I watched her walk out with her crutches I have some trepidation about the ability to walk with the cast on her right foot. Nevertheless with her Charcot deformities I do not think there is a way to heal these short of offloading them aggressively. 2/22; the culture of the bone in the left foot that I did last week showed a few Citrobacter Koseri as well as some streptococcal species. In my attempted bone for pathology did not actually yield a lot of bone the pathologist did not identify any osteomyelitis. 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 Is promised today and aggressive debridement of the right foot and a total contact cast which surprisingly she seemed to tolerate quite well 03/13/2019 upon evaluation today patient appears to be doing well with her total contact cast she is here for the first cast change. She had no areas of rubbing or any other complications at this  point. Overall things are doing quite well. 2/26; the patient was kindly seen by Dr. Orvan Falconer of infectious disease on 2/25. She is now on IV vancomycin PICC line placement in 2 days and oral Flagyl. This was in response to her MRI showing osteomyelitis of the left calcaneus concerning for osteomyelitis. We have been putting her right leg in a total contact cast. Our nurses report drainage. She is tolerating the total contact cast well. 3/4; the patient is started her IV antibiotics and is taking IV vancomycin and oral Flagyl. She appears to be tolerating these both well. This is for osteomyelitis involving the left calcaneus. The area on the right in the setting of bilateral Charcot deformities did not have any bone infection on MRI. We put a total contact cast on her with silver alginate as the primary dressing and she returns today in follow-up. Per our intake nurse there was too much drainage to leave this on all week therefore we will bring her back for an early change 3/8; follow-up total contact cast she is still having a lot of drainage probably too much drainage from the right foot wound will week with the same cast. She will be back on Thursday. The area on the left looks somewhat better 3/11; patient here for a total contact cast change. 3/15; patient came in for wound care evaluation. She is still on IV vancomycin and oral Flagyl for osteomyelitis in the left foot. The area on the right foot we are putting in a total contact cast. 3/18 for total contact cast change on the right foot 3/22; the patient was here for wound review today. The area on the right foot is slightly smaller in terms of surface area. However the surface of the wound is very gritty and hyper granulated. More problematically the area on the plantar left foot has increased depth indeed in the center of this it probes to bone. We have been using Hydrofera Blue in both areas. She is on antibiotics as prescribed by  infectious disease IV vancomycin and oral Flagyl 3/25; the patient is in for total contact cast change. Our intake nurse was concerned about the amount of drainage which soaked right to the multiple layers we are putting on this. Wondered about options. The wound bed actually looks as healthy as this is looked although there may not be a lot of change in dimensions things are looking a lot better. If we are going to heal this woman's wound which is in the middle of her right Charcot foot this is going to need to be offloaded. There are not many alternatives 3/29; still not a lot of improvement although the surface of the right wound looks better. We have been using Hydrofera Blue 04/17/2019 upon evaluation today patient appears to be doing well with a total contact cast. With actually having to change this twice a week. She saw [name] for wound care earlier in the week this is for the cast change today. That is  due to the fact that she has significant drainage. Overall the wound is been looking excellent however. 4/5; we continue to put a total contact cast on this patient with Hydrofera Blue. Still a lot of drainage which precludes a simple weekly change or changing this twice a week. 4/8; total contact cast changed today. Apparently the wound looks better per our intake nurse 4/12; patient here for wound care evaluation. Wounds on the right foot have not changed in dimension none about a month left foot looks similar. Apparently her antibiotics that we are giving her for osteomyelitis in the left foot complete on Wednesday. We have been using Hydrofera Blue on the right foot under a total contact cast. Nurses report greenish drainage although I think this may be discoloration from the [facility] 4/15; back for cast change wounds look quite good although still moderate amount of drainage. T contact cast reapplied otal 4/19;. Wound evaluation. Wound on the right foot is smaller surface looks  healthy. There is still the divot in the middle part of this wound but I could not feel any bone. Area on the left foot also looks better She has completed her antibiotics but still has the PICC line in. 4/23; patient comes back for a total contact cast change this was done in the standard fashion no issues 4/26; wound measures slightly larger on the right foot which is disappointing. She sees Dr. Orvan Falconer with regards to her antibiotics on Wednesday. I believe she is on vancomycin IV and oral Flagyl. 4/29; in for her routine total contact cast change. She saw Dr. Orvan Falconer this morning. He is asking about antibiotic continuation I will be in touch with him. I did not look at her wound today 5/3; patient saw Dr. Orvan Falconer on 4/28. I did send him a note asking about the continuation of perhaps an oral regimen. I have not heard back. She still has an elevated sedimentation rate at 81. Her wounds are smaller. 5/6; in for her total contact cast change. We did not look at the wound today. She has seen Dr. Orvan Falconer and is now on oral Keflex and Flagyl. She seems to be tolerating these well 5/10; she had her PICC line removed. The wound on the right foot after some initial improvement has not made any improvement even with the total contact cast. Still having a lot of drainage. Likewise the area on the left foot really is not any better either 5/14; in for a cast change today. We will look at the right foot on Monday. If this is not making any progress I am going to try to cast the left foot. 5/17; the wound on the right foot is absolutely no different. I am going to put ointment on this area and change the cast of the left foot. We have been using silver alginate there 5/21; apparently the area on the right foot was better per our intake nurse although I did not see it. We did a standard total contact cast change on the left foot we have been applying silver alginate to the wound here. 06/10/19-Area on the  right foot per dimensions slightly better, the left foot has still some depth applying silver alginate to the wound with TCC 06/13/19-Patient here for Casting to Left foot 6/1; patient had the total contact cast were placed on the left foot. Noted some swelling in her right leg although the dimensions of the wound on the right foot are better. We have been using PolyMem on  the right and silver alginate on the left under the cast 6/7; we continue to make nice progress in both her plantar feet wound. We are putting the total contact cast on the left. Surprisingly in spite of this the area on the right medial foot in the middle of her Charcot deformities actually is making progress as well 07/01/2019 upon evaluation today patient actually appears to be doing quite well with regard to her wounds on the feet compared to when I saw her last that has been quite some time. With that being said I do believe the total contact cast has been beneficial in this left foot and she has a lot of signs of improvement she does have some callus overhanging which is caused a little bit of a ledge and an issue here. I do believe that we may be able to do something to help in that regard. Fortunately otherwise the wound seems to be progressing quite nicely. 6/21; the patient's area on the left foot that we are currently casting is just about closed. The area on the right medial foot has a healthy looking surface but may be not changed as much from last week in terms of surface area. We are using silver alginate on the right and polymen Ag under the cast 6/28; only a small open area on the left foot remains. Using silver alginate under a total contact cast. She has a diabetic shoe I have asked her to bring that for next week where this may be closed. The area on the right foot with a Charcot deformity is also measuring slightly smaller. We are using PolyMem Ag here 7/6; left foot is totally closed. She does not require a total  contact cast. She will graduate to her own diabetic shoe. The area on the right foot with a Charcot deformity measuring about the same. We have been using polymen under compression wraps. She did not do well in this area with a total contact cast 7/13; left foot remains intact. She is skillfully covering this area with foam and using her diabetic shoe. Slight area improvement of the right foot wound. Still rolled senescent looking edges. We have been using polymen 7/19; left foot remains intact. Wound measuring slightly smaller on the right foot in the middle of the Charcot deformity. We have been using PolyMem Ag 7/26; per the patient left foot remains intact. The wound on the right foot looks slightly smaller but dimensions are measuring the same. We have been using polymen Ag 8/9-Patient back after 2 weeks, wound of the right foot again looking slightly smaller, continuing to use PolyMem silver 8/16; patient has made some minor improvements in wound area on the right foot. Using polymen Ag. The left foot remains healed 8/23; continued slight improvement in dimensions. Rolled edges around the wound edge noted. We have been using polymen Ag. Patient offloading with a surgical shoe and crutches 8/30; no change in surface area. Thick rolled skin and subcutaneous tissue around the edges debris on the wound surface. We have been using polymen Ag initially with some improvement however stalled on the last 2 weeks in terms of surface area 9/13; surface area is smaller still using polymen Ag. Thick rolled skin and subcutaneous tissue around the edges requiring debridement. 9/20; again surface area is improved using polymen Ag however each week she develops thick rolled skin and subcutaneous tissue around the wound edges requiring debridement. She is offloading this using crutches 9/27; we've been using PolyMem AG. Wound  does not look any different from last week. Raised edges of callus skin and  subcutaneous tissue around the wound bed. 10/5; we been using polymen Ag. No ability to put a total contact cast on today in the clinic. Wound is about the same still rolled edges of thick callus around the margins 10/18; we put her in a total contact cast last week things did not go well. Not only does the wound looked worse macerated angry with thick callus and some warmth around it. She had another wound above this and one on the anterior right leg. I am not totally certain why this happened. She has not been systemically unwell 10/25; culture from last week grew Pseudomonas and MRSA. My experience with this MRSA is usually a true infection and Pseudomonas a colonizer I am going to put her on doxycycline today we put gentamicin on the wound surface. Continuing with silver alginate 11/1; she has tolerated her antibiotics. I gave her doxycycline. Was still using gentamicin. The original wound looks about the same thick rolled edges. The cast injury just above it is superficial Electronic Signature(s) Signed: 11/18/2019 5:37:06 PM By: Baltazar Najjar MD Entered By: Baltazar Najjar on 11/18/2019 15:34:44 -------------------------------------------------------------------------------- Physical Exam Details Patient Name: Date of Service: [name], [name] 11/18/2019 1:45 PM Medical Record Number: [id number] Patient Account Number: [id number] Date of Birth/Sex: Treating RN: 1955/09/02 (64 y.o. [name] Primary Care Provider: MA SO UDI, [place] MA LSA DA T Other Clinician: Referring Provider: Treating Provider/Extender: Baltazar Najjar MA SO UDI, ELHA MA LSA DA T Weeks in Treatment: 6 Constitutional Sitting or standing Blood Pressure is within target range for patient.. Pulse regular and within target range for patient.Marland Kitchen Respirations regular, non-labored and within target range.. Temperature is normal and within the target range for the patient.Marland Kitchen Appears in no distress. Notes Wound  exam; right medial plantar foot. Thick rolled edges of skin and callus which I removed with a #5 curette there is no need to debride the surface of the wound today which looked healthy. Just superior to this is the remanent of the cast injury. This looks superficial as well. The cast injury wound on the right anterior upper tibia is closed Electronic Signature(s) Signed: 11/18/2019 5:37:06 PM By: Baltazar Najjar MD Entered By: Baltazar Najjar on 11/18/2019 15:35:38 -------------------------------------------------------------------------------- Physician Orders Details Patient Name: Date of Service: [name], Kerie J. 11/18/2019 1:45 PM Medical Record Number: [id number] Patient Account Number: [id number] Date of Birth/Sex: Treating RN: 30-Jan-1955 (64 y.o. 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 Dressing Change Frequency Do not change entire dressing for one week. Wound Cleansing May shower with protection. - use cast protector Primary Wound Dressing Wound #12 Right,Medial Malleolus Polymem Silver Wound #9 Right,Medial Foot Polymem Silver Secondary Dressing Wound #12 Right,Medial Malleolus Dry Gauze Wound #9 Right,Medial Foot Foam - foam donut ABD pad Zetuvit or Kerramax Edema Control 3 Layer Compression System - Right Lower Extremity Avoid standing for long periods of time Elevate legs to the level of the heart  or above for 30 minutes daily  and/or when sitting, a frequency of: - throughout the day Exercise regularly Off-Loading Open toe surgical shoe to: - right foot Electronic Signature(s) Signed: 11/18/2019 5:37:06 PM By: Baltazar Najjar MD Signed: 11/18/2019 5:57:05 PM By: Zandra Abts RN, BSN Entered By: Zandra Abts on 11/18/2019 15:08:17 -------------------------------------------------------------------------------- Problem List Details Patient Name: Date of Service: [name], [name] 11/18/2019 1:45 PM Medical Record Number: [id number] Patient Account Number: [id number] Date of Birth/Sex: Treating RN: 23-Feb-1955 (64 y.o. [name] Primary Care Provider: MA SO UDI, [place] MA LSA DA T Other Clinician: Referring Provider: Treating Provider/Extender: Abe People, ELHA MA LSA DA T Weeks in Treatment: 71 Active Problems ICD-10 Encounter Code Description Active Date MDM Diagnosis E11.621 Type 2 diabetes mellitus with foot ulcer 01/28/2019 No Yes L97.511 Non-pressure chronic ulcer of other part of right foot limited to breakdown of 01/28/2019 No Yes skin E11.42 Type 2 diabetes mellitus with diabetic polyneuropathy 01/28/2019 No Yes M14.671 Charcot's joint, right ankle and foot 01/28/2019 No Yes Inactive Problems ICD-10 Code Description Active Date Inactive Date L97.524 Non-pressure chronic ulcer of other part of left foot with necrosis of bone 01/28/2019 01/28/2019 L97.514 Non-pressure chronic ulcer of other part of right foot with necrosis of bone 01/28/2019 01/28/2019 W29.562 Charcot's joint, left ankle and foot 01/28/2019 01/28/2019 Z30.865 Other chronic osteomyelitis, left ankle and foot 04/08/2019 04/08/2019 Resolved Problems Electronic Signature(s) Signed: 11/18/2019 5:37:06 PM By: Baltazar Najjar MD Entered By: Baltazar Najjar on 11/18/2019 15:33:37 -------------------------------------------------------------------------------- Progress Note Details Patient Name: Date of Service: [name],  [name] 11/18/2019 1:45 PM Medical Record Number: [id number] Patient Account Number: [id number] Date of Birth/Sex: Treating RN: September 01, 1955 (64 y.o. [name] Primary Care Provider: MA SO UDI, [place] MA LSA DA T Other Clinician: Referring Provider: Treating Provider/Extender: Baltazar Najjar MA SO UDI, ELHA MA LSA DA T Weeks in Treatment: 42 Subjective History of Present Illness (HPI) 64 yrs old bf here for follow up recurrent left charcot foot ulcer. She has DM. She is not a smoker. She states a blister developed 3 months ago at site of previous breakdown from 1 year ago. It was debrided at the podiatrist's office. She went to the ER and then sent here. She denies pain. Xray was negative for osteo. Culture from this clinic negative. 09/08/14 [name] to [name] for Charcot foot. Seen by him and MRI scheduled 09/19/14. Prior silver collagen but this was not ordered last visit, patient has been rinsing with saline alone. Re institute silver collagen every other day. F/u 2 weeks with [name] as Labor Day holiday. Supplies ordered. 09/25/14; this is a patient with a Charcot foot on the left. she has a wound over the plantar aspect of her left calcaneus. She has been seen by [name] of orthopedic surgery and has had an MRI. She is apparently going within the next week or 2 for corrective surgery which will involve an external fixator. I don't have any of the details of this. 10/06/14; The patient is a 64 yrs old bf with a left Charcot foot and ulcer on the plantar. 12/01/14 Returns post surgery with [name]. Has follow up visit later this week with him, currently in facility and NWB over this extremity. States no drainage from foot and no current wound care. On exam callus and scab in place, suspect healed. Has external fixator in place. Will defer to [name] for management, ok to place moisturizing cream over  scabs and callus and she will call if she requires follow  up here. READMISSION 01/28/2019 Patient was in the clinic in 2016 for a wound on the plantar aspect of her left calcaneus. Predominantly cared for by [name] she was referred to [name] and had corrective surgery for the position of her left ankle I believe. She had previously been here in 2015 and then prior to that in 2011 2012 that I do not have these records. More recently she has developed fairly extensive wounds on the right medial foot, left lateral foot and a small area on the right medial great toe. Right medial foot wound has been there for 6 to 7 months, left foot wound for 2 to 3 months and the right great toe only over the last few weeks. She has been followed by [name] at Dr. Laverta [place] office it sounds as though she has been undergoing callus paring and silver nitrate. The patient has been washing these off with soap and water and plan applying dry dressing Past medical history, type 2 diabetes well controlled with a recent hemoglobin A1c of 7 on 11/16, type 2 diabetes with peripheral neuropathy, previous left Charcot joint surgery bilateral Charcot joints currently, stage III chronic renal failure, hypertension, obstructive sleep apnea, history of MRSA and gastroesophageal reflux. 1/18; this is a patient with bilateral Charcot feet. Plain x-rays that I did of both of these wounds that are either at bone or precariously close to bone were done. On the right foot this showed possible lytic destruction involving the anterior aspect of the talus which may represent a wound osteomyelitis. An MRI was recommended. On the left there was no definite lytic destruction although the wound is deep undermining's and I think probably requires an MRI on this basis in any case 1/25; patient was supposed to have her MRI last Friday of her bilateral feet however they would not do it because there was silver alginate in her dressings, will replace with calcium alginate today and will see if  we can get her done today 2/1; patient did not get her MRIs done last week because of issues with the MRI department receiving orders to do bilateral feet. She has 2 deep wounds in the setting of Charcot feet deformities bilaterally. Both of them at one point had exposed bone although I had trouble demonstrating that today. I am not sure that we can offload these very aggressively as I watched her leave the clinic last week her gait is very narrow and unsteady. 2/15; the MRI of her right foot did not show osteomyelitis potentially osteonecrosis of the talus. 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 The wounds are a large wound on the right medial foot and on the left a substantial wound on the left lateral plantar calcaneus. We have been using silver alginate. Ultimately I think we need to consider a total contact cast on the right while we work through the possibility of osteomyelitis in the left. I watched her walk out with her crutches I have some trepidation about the ability to walk with the cast on her right foot. Nevertheless with her Charcot deformities I do not think there is a way to heal these short of offloading them aggressively. 2/22; the culture of the bone in the left foot that I did last week showed a few Citrobacter Koseri as well as some streptococcal species. In my attempted bone for pathology did not actually yield  a lot of bone the pathologist did not identify any osteomyelitis. 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 Is promised today and aggressive debridement of the right foot and a total contact cast which surprisingly she seemed to tolerate quite well 03/13/2019 upon evaluation today patient appears to be doing well with her total contact cast she is here for the first cast  change. She had no areas of rubbing or any other complications at this point. Overall things are doing quite well. 2/26; the patient was kindly seen by Dr. Orvan Falconer of infectious disease on 2/25. She is now on IV vancomycin PICC line placement in 2 days and oral Flagyl. This was in response to her MRI showing osteomyelitis of the left calcaneus concerning for osteomyelitis. We have been putting her right leg in a total contact cast. Our nurses report drainage. She is tolerating the total contact cast well. 3/4; the patient is started her IV antibiotics and is taking IV vancomycin and oral Flagyl. She appears to be tolerating these both well. This is for osteomyelitis involving the left calcaneus. The area on the right in the setting of bilateral Charcot deformities did not have any bone infection on MRI. We put a total contact cast on her with silver alginate as the primary dressing and she returns today in follow-up. Per our intake nurse there was too much drainage to leave this on all week therefore we will bring her back for an early change 3/8; follow-up total contact cast she is still having a lot of drainage probably too much drainage from the right foot wound will week with the same cast. She will be back on Thursday. The area on the left looks somewhat better 3/11; patient here for a total contact cast change. 3/15; patient came in for wound care evaluation. She is still on IV vancomycin and oral Flagyl for osteomyelitis in the left foot. The area on the right foot we are putting in a total contact cast. 3/18 for total contact cast change on the right foot 3/22; the patient was here for wound review today. The area on the right foot is slightly smaller in terms of surface area. However the surface of the wound is very gritty and hyper granulated. More problematically the area on the plantar left foot has increased depth indeed in the center of this it probes to bone. We have been using  Hydrofera Blue in both areas. She is on antibiotics as prescribed by infectious disease IV vancomycin and oral Flagyl 3/25; the patient is in for total contact cast change. Our intake nurse was concerned about the amount of drainage which soaked right to the multiple layers we are putting on this. Wondered about options. The wound bed actually looks as healthy as this is looked although there may not be a lot of change in dimensions things are looking a lot better. If we are going to heal this woman's wound which is in the middle of her right Charcot foot this is going to need to be offloaded. There are not many alternatives 3/29; still not a lot of improvement although the surface of the right wound looks better. We have been using Hydrofera Blue 04/17/2019 upon evaluation today patient appears to be doing well with a total contact cast. With actually having to change this twice a week. She saw  [name] for wound care earlier in the week this is for the cast change today. That is due to the fact that she has significant drainage. Overall the wound is been looking excellent however. 4/5; we continue to put a total contact cast on this patient with Hydrofera Blue. Still a lot of drainage which precludes a simple weekly change or changing this twice a week. 4/8; total contact cast changed today. Apparently the wound looks better per our intake nurse 4/12; patient here for wound care evaluation. Wounds on the right foot have not changed in dimension none about a month left foot looks similar. Apparently her antibiotics that we are giving her for osteomyelitis in the left foot complete on Wednesday. We have been using Hydrofera Blue on the right foot under a total contact cast. Nurses report greenish drainage although I think this may be discoloration from the [facility] 4/15; back for cast change wounds look quite good although still moderate amount of drainage. T contact cast  reapplied otal 4/19;. Wound evaluation. Wound on the right foot is smaller surface looks healthy. There is still the divot in the middle part of this wound but I could not feel any bone. Area on the left foot also looks better She has completed her antibiotics but still has the PICC line in. 4/23; patient comes back for a total contact cast change this was done in the standard fashion no issues 4/26; wound measures slightly larger on the right foot which is disappointing. She sees Dr. Orvan Falconer with regards to her antibiotics on Wednesday. I believe she is on vancomycin IV and oral Flagyl. 4/29; in for her routine total contact cast change. She saw Dr. Orvan Falconer this morning. He is asking about antibiotic continuation I will be in touch with him. I did not look at her wound today 5/3; patient saw Dr. Orvan Falconer on 4/28. I did send him a note asking about the continuation of perhaps an oral regimen. I have not heard back. She still has an elevated sedimentation rate at 81. Her wounds are smaller. 5/6; in for her total contact cast change. We did not look at the wound today. She has seen Dr. Orvan Falconer and is now on oral Keflex and Flagyl. She seems to be tolerating these well 5/10; she had her PICC line removed. The wound on the right foot after some initial improvement has not made any improvement even with the total contact cast. Still having a lot of drainage. Likewise the area on the left foot really is not any better either 5/14; in for a cast change today. We will look at the right foot on Monday. If this is not making any progress I am going to try to cast the left foot. 5/17; the wound on the right foot is absolutely no different. I am going to put ointment on this area and change the cast of the left foot. We have been using silver alginate there 5/21; apparently the area on the right foot was better per our intake nurse although I did not see it. We did a standard total contact cast change on  the left foot we have been applying silver alginate to the wound here. 06/10/19-Area on the right foot per dimensions slightly better, the left foot has still some depth applying silver alginate to the wound with TCC 06/13/19-Patient here for Casting to Left foot 6/1; patient had the total contact cast were placed on the left foot. Noted some swelling in her right leg  although the dimensions of the wound on the right foot are better. We have been using PolyMem on the right and silver alginate on the left under the cast 6/7; we continue to make nice progress in both her plantar feet wound. We are putting the total contact cast on the left. Surprisingly in spite of this the area on the right medial foot in the middle of her Charcot deformities actually is making progress as well 07/01/2019 upon evaluation today patient actually appears to be doing quite well with regard to her wounds on the feet compared to when I saw her last that has been quite some time. With that being said I do believe the total contact cast has been beneficial in this left foot and she has a lot of signs of improvement she does have some callus overhanging which is caused a little bit of a ledge and an issue here. I do believe that we may be able to do something to help in that regard. Fortunately otherwise the wound seems to be progressing quite nicely. 6/21; the patient's area on the left foot that we are currently casting is just about closed. The area on the right medial foot has a healthy looking surface but may be not changed as much from last week in terms of surface area. We are using silver alginate on the right and polymen Ag under the cast 6/28; only a small open area on the left foot remains. Using silver alginate under a total contact cast. She has a diabetic shoe I have asked her to bring that for next week where this may be closed. The area on the right foot with a Charcot deformity is also measuring slightly smaller.  We are using PolyMem Ag here 7/6; left foot is totally closed. She does not require a total contact cast. She will graduate to her own diabetic shoe. ooThe area on the right foot with a Charcot deformity measuring about the same. We have been using polymen under compression wraps. She did not do well in this area with a total contact cast 7/13; left foot remains intact. She is skillfully covering this area with foam and using her diabetic shoe. Slight area improvement of the right foot wound. Still rolled senescent looking edges. We have been using polymen 7/19; left foot remains intact. Wound measuring slightly smaller on the right foot in the middle of the Charcot deformity. We have been using PolyMem Ag 7/26; per the patient left foot remains intact. The wound on the right foot looks slightly smaller but dimensions are measuring the same. We have been using polymen Ag 8/9-Patient back after 2 weeks, wound of the right foot again looking slightly smaller, continuing to use PolyMem silver 8/16; patient has made some minor improvements in wound area on the right foot. Using polymen Ag. The left foot remains healed 8/23; continued slight improvement in dimensions. Rolled edges around the wound edge noted. We have been using polymen Ag. Patient offloading with a surgical shoe and crutches 8/30; no change in surface area. Thick rolled skin and subcutaneous tissue around the edges debris on the wound surface. We have been using polymen Ag initially with some improvement however stalled on the last 2 weeks in terms of surface area 9/13; surface area is smaller still using polymen Ag. Thick rolled skin and subcutaneous tissue around the edges requiring debridement. 9/20; again surface area is improved using polymen Ag however each week she develops thick rolled skin and subcutaneous tissue around  the wound edges requiring debridement. She is offloading this using crutches 9/27; we've been using PolyMem  AG. Wound does not look any different from last week. Raised edges of callus skin and subcutaneous tissue around the wound bed. 10/5; we been using polymen Ag. No ability to put a total contact cast on today in the clinic. Wound is about the same still rolled edges of thick callus around the margins 10/18; we put her in a total contact cast last week things did not go well. Not only does the wound looked worse macerated angry with thick callus and some warmth around it. She had another wound above this and one on the anterior right leg. I am not totally certain why this happened. She has not been systemically unwell 10/25; culture from last week grew Pseudomonas and MRSA. My experience with this MRSA is usually a true infection and Pseudomonas a colonizer I am going to put her on doxycycline today we put gentamicin on the wound surface. Continuing with silver alginate 11/1; she has tolerated her antibiotics. I gave her doxycycline. Was still using gentamicin. The original wound looks about the same thick rolled edges. The cast injury just above it is superficial Objective Constitutional Sitting or standing Blood Pressure is within target range for patient.. Pulse regular and within target range for patient.Marland Kitchen Respirations regular, non-labored and within target range.. Temperature is normal and within the target range for the patient.Marland Kitchen Appears in no distress. Vitals Time Taken: 1:59 PM, Height: 68 in, Source: Stated, Weight: 265 lbs, Source: Stated, BMI: 40.3, Temperature: 98.2 F, Pulse: 76 bpm, Respiratory Rate: 18 breaths/min, Blood Pressure: 128/75 mmHg, Capillary Blood Glucose: 135 mg/dl. General Notes: Wound exam; right medial plantar foot. Thick rolled edges of skin and callus which I removed with a #5 curette there is no need to debride the surface of the wound today which looked healthy. ooJust superior to this is the remanent of the cast injury. This looks superficial as well. The cast  injury wound on the right anterior upper tibia is closed Integumentary (Hair, Skin) Wound #12 status is Open. Original cause of wound was Trauma. The wound is located on the Right,Medial Malleolus. The wound measures 1.5cm length x 0.5cm width x 0.1cm depth; 0.589cm^2 area and 0.059cm^3 volume. There is Fat Layer (Subcutaneous Tissue) exposed. There is no tunneling or undermining noted. There is a medium amount of serosanguineous drainage noted. The wound margin is flat and intact. There is medium (34-66%) red, pink granulation within the wound bed. There is a medium (34-66%) amount of necrotic tissue within the wound bed including Adherent Slough. Wound #13 status is Healed - Epithelialized. Original cause of wound was Trauma. The wound is located on the Right,Anterior Lower Leg. The wound measures 0cm length x 0cm width x 0cm depth; 0cm^2 area and 0cm^3 volume. There is no tunneling or undermining noted. There is a none present amount of drainage noted. The wound margin is flat and intact. There is no granulation within the wound bed. There is no necrotic tissue within the wound bed. Wound #9 status is Open. Original cause of wound was Blister. The wound is located on the Right,Medial Foot. The wound measures 1.2cm length x 1.3cm width x 0.4cm depth; 1.225cm^2 area and 0.49cm^3 volume. There is Fat Layer (Subcutaneous Tissue) exposed. There is no tunneling or undermining noted. There is a medium amount of serosanguineous drainage noted. The wound margin is thickened. There is small (1-33%) red, pale granulation within the wound bed. There  is a large (67-100%) amount of necrotic tissue within the wound bed including Adherent Slough. 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 There was a Selective/Open Wound Skin/Epidermis Debridement with a total area of 1.56 sq cm performed by [name], MD. With the following instrument(s): Curette to remove Viable and Non-Viable tissue/material. Material removed includes Callus and Skin: Epidermis and. No specimens were taken. A time out was conducted at 15:04, prior to the start of the procedure. A Minimum amount of bleeding was controlled with Pressure. The procedure was tolerated well with a pain level of 0 throughout and a pain level of 0 following the procedure. Post Debridement Measurements: 1.2cm length x 1.3cm width x 0.4cm depth; 0.49cm^3 volume. Character of Wound/Ulcer Post Debridement is improved. Severity of Tissue Post Debridement is: Fat layer exposed. Post procedure Diagnosis Wound #9: Same as Pre-Procedure Pre-procedure diagnosis of Wound #9 is a Diabetic Wound/Ulcer of the Lower Extremity located on the Right,Medial Foot . There was a Three Layer Compression Therapy Procedure by Zandra Abts, RN. Post procedure Diagnosis Wound #9: Same as Pre-Procedure Wound #12 Pre-procedure diagnosis of Wound #12 is a Diabetic Wound/Ulcer of the Lower Extremity located on the Right,Medial Malleolus . There was a Three Layer Compression Therapy Procedure by Zandra Abts, RN. Post procedure Diagnosis Wound #12: Same as Pre-Procedure Plan Follow-up Appointments: Return Appointment in 1 week. Dressing Change Frequency: Do not change entire dressing for one week. Wound Cleansing: May shower with protection. - use cast protector Primary Wound Dressing: Wound #12 Right,Medial Malleolus: Polymem Silver Wound #9 Right,Medial Foot: Polymem Silver Secondary Dressing: Wound #12 Right,Medial Malleolus: Dry  Gauze Wound #9 Right,Medial Foot: Foam - foam donut ABD pad Zetuvit or Kerramax Edema Control: 3 Layer Compression System - Right Lower Extremity Avoid standing for long periods of time Elevate legs to the level of the heart or above for 30 minutes daily and/or when sitting, a frequency of: - throughout the day Exercise regularly Off-Loading: Open toe surgical shoe to: - right foot 1. I change the primary dressing to both wounds to polymen Ag 2. Hopeful that add another layer of padding to this area I have asked her to try and keep her foot off the floor in her crutches I know that is not an easy thing for her to do Electronic Signature(s) Signed: 11/18/2019 5:37:06 PM By: Baltazar Najjar MD Entered By: Baltazar Najjar on 11/18/2019 15:36:17 -------------------------------------------------------------------------------- SuperBill Details Patient Name: Date of Service: [facility], Nastacia J. 11/18/2019 Medical Record Number: [id number] Patient Account Number: [id number] Date of Birth/Sex: Treating RN: [birth year] (64 y.o. [name] Primary Care Provider: MA SO UDI, Hawthorne MA LSA DA T Other Clinician: Referring Provider: Treating Provider/Extender: Abe People, ELHA MA LSA DA T Weeks in Treatment: 42 Diagnosis Coding ICD-10 Codes Code Description E11.621 Type 2 diabetes mellitus with foot ulcer L97.511 Non-pressure chronic ulcer of other part of right foot limited to breakdown of skin E11.42 Type 2 diabetes mellitus with diabetic polyneuropathy M14.671 Charcot's joint, right ankle and foot Facility Procedures CPT4 Code: [id number] Description: 97597 - DEBRIDE WOUND 1ST 20 SQ CM OR < ICD-10 Diagnosis  Description E11.621 Type 2 diabetes mellitus with foot ulcer L97.511 Non-pressure chronic ulcer of other part of right foot limited to breakdown of ski Modifier: n Quantity: 1 Physician Procedures : CPT4 Code Description Modifier [id number] - WC PHYS DEBR WO ANESTH  20 SQ CM ICD-10 Diagnosis Description E11.621 Type 2 diabetes mellitus with foot ulcer L97.511 Non-pressure chronic ulcer of other part of right foot limited to breakdown of skin Quantity: 1 Electronic Signature(s) Signed: 11/18/2019 5:37:06 PM By: Baltazar Najjar MD Signed: 11/18/2019 5:37:06 PM By: Baltazar Najjar MD Entered By: Baltazar Najjar on 11/18/2019 15:36:43

## 2019-11-19 NOTE — Progress Notes (Signed)
MEEYA, GOLDIN ([id number]) Visit Report for 11/18/2019 Arrival Information Details Patient Name: Date of Service: Sarah, [name] 11/18/2019 1:45 PM Medical Record Number: [id number] Patient Account Number: [id number] Date of Birth/Sex: Treating RN: 12/12/1955 (64 y.o. [name]: MA SO UDI, [place] MA LSA DA T Other Clinician: Referring Dorota Heinrichs: Treating Soriya Worster/Extender: Baltazar Najjar MA SO UDI, ELHA MA LSA DA T Weeks in Treatment: 36 Visit Information History Since Last Visit Added or deleted any medications: No Patient Arrived: Crutches Any new allergies or adverse reactions: No Arrival Time: 13:58 Had a fall or experienced change in No Accompanied By: self activities of daily living that may affect Transfer Assistance: None risk of falls: Patient Identification Verified: Yes Signs or symptoms of abuse/neglect since No Secondary Verification Process Completed: Yes last visito Patient Requires Transmission-Based Precautions: No Hospitalized since last visit: No Patient Has Alerts: No Implantable device outside of the clinic No excluding cellular tissue based products placed in the center since last visit: Has Dressing in Place as Prescribed: Yes Has Footwear/Offloading in Place as Yes Prescribed: Right: Surgical Shoe with Pressure Relief Insole Pain Present Now: No Electronic Signature(s) Signed: 11/18/2019 5:46:37 PM By: Zenaida Deed RN, BSN Entered By: Zenaida Deed on 11/18/2019 13:59:28 -------------------------------------------------------------------------------- Compression Therapy Details Patient Name: Date of Service: [name], [name] 11/18/2019 1:45 PM Medical Record Number: [id number] Patient Account Number: [id number] Date of Birth/Sex: Treating RN: 1956-01-04 (64 y.o. [name] Primary Care Larken Urias: MA SO UDI, Judene Companion MA LSA DA T Other Clinician: Referring Blaize Epple: Treating Darshay Deupree/Extender: Baltazar Najjar MA SO UDI, ELHA MA LSA DA T Weeks in Treatment: 42 Compression Therapy Performed for Wound Assessment: Wound #12 Right,Medial Malleolus Performed By: Clinician Zandra Abts, RN Compression Type: Three Layer Post Procedure Diagnosis Same as Pre-procedure Electronic Signature(s) Signed: 11/18/2019 5:57:05 PM By: Zandra Abts RN, BSN Entered By: Zandra Abts on 11/18/2019 15:08:54 -------------------------------------------------------------------------------- Compression Therapy Details Patient Name: Date of Service: [name], [name] 11/18/2019 1:45 PM Medical Record Number: [id number] Patient Account Number: [id number] Date of Birth/Sex: Treating RN: Jul 26, 1955 (64 y.o. [name] Primary Care Jesse Nosbisch: MA SO UDI, Judene Companion MA LSA DA T Other Clinician: Referring Briley Sulton: Treating Elza Sortor/Extender: Baltazar Najjar MA SO UDI, ELHA MA LSA DA T Weeks in Treatment: 42 Compression Therapy Performed for Wound Assessment: Wound #9 Right,Medial Foot Performed By: Clinician Zandra Abts, RN Compression Type: Three Layer Post Procedure Diagnosis Same as Pre-procedure Electronic Signature(s) Signed: 11/18/2019 5:57:05 PM By: Zandra Abts RN, BSN Entered By: Zandra Abts on 11/18/2019 15:08:54 -------------------------------------------------------------------------------- Encounter Discharge Information Details Patient Name: Date of Service: [name], [name] 11/18/2019 1:45 PM Medical Record Number: [id number] Patient Account Number: [id number] Date of Birth/Sex: Treating RN: 03/22/1955 (64 y.o. [name] Primary Care Akshay Spang: MA SO UDI, [place] MA LSA DA T Other Clinician: Referring Zelphia Glover: Treating Tyqwan Pink/Extender: Baltazar Najjar MA SO UDI, ELHA MA LSA DA T Weeks in Treatment: 22 Encounter Discharge Information Items Post Procedure Vitals Discharge Condition: Stable Temperature (F): 98.6 Ambulatory Status: Crutches Pulse (bpm): 76 Discharge  Destination: Home Respiratory Rate (breaths/min): 18 Transportation: Private Auto Blood Pressure (mmHg): 128/75 Accompanied By: self Schedule Follow-up Appointment: Yes Clinical Summary of Care: Electronic Signature(s) Signed: 11/18/2019 6:07:40 PM By: Shawn Stall Entered By: Shawn Stall on 11/18/2019 18:02:13 -------------------------------------------------------------------------------- Lower Extremity Assessment Details Patient Name: Date of Service: Sarah, [name] 11/18/2019 1:45 PM Medical Record Number: [id number] Patient Account Number: [id number] Date of Birth/Sex: Treating RN: 03/23/1955 (64 y.o. F) [place],  [facility] Primary Care Indria Bishara: MA SO UDI, [place] MA LSA DA T Other Clinician: Referring Jahmari Esbenshade: Treating Maycie Luera/Extender: Baltazar Najjar MA SO UDI, ELHA MA LSA DA T Weeks in Treatment: 42 Edema Assessment Assessed: [Left: No] [Right: No] Edema: [Left: N] [Right: o] Calf Left: Right: Point of Measurement: 43 cm From Medial Instep 35 cm Ankle Left: Right: Point of Measurement: 12 cm From Medial Instep 22.4 cm Vascular Assessment Pulses: Dorsalis Pedis Palpable: [Right:Yes] Electronic Signature(s) Signed: 11/18/2019 5:46:37 PM By: Zenaida Deed RN, BSN Entered By: Zenaida Deed on 11/18/2019 14:07:37 -------------------------------------------------------------------------------- Multi Wound Chart Details Patient Name: Date of Service: [name], [name] 11/18/2019 1:45 PM Medical Record Number: [id number] Patient Account Number: [id number] Date of Birth/Sex: Treating RN: [birth year] (64 y.o. [name] Primary Care Telesa Jeancharles: MA SO UDI, ELHA MA LSA DA T Other Clinician: Referring Sokhna Christoph: Treating Brennan Litzinger/Extender: Baltazar Najjar MA SO UDI, ELHA MA LSA DA T Weeks in Treatment: 42 Vital Signs Height(in): 68 Capillary Blood Glucose(mg/dl): 409 Weight(lbs): 811 Pulse(bpm): 76 Body Mass Index(BMI): 40 Blood Pressure(mmHg):  128/75 Temperature(F): 98.2 Respiratory Rate(breaths/min): 18 Photos: [12:No Photos Right, Medial Malleolus] [13:No Photos Right, Anterior Lower Leg] [9:No Photos Right, Medial Foot] Wound Location: [12:Trauma] [13:Trauma] [9:Blister] Wounding Event: [12:Diabetic Wound/Ulcer of the Lower] [13:Diabetic Wound/Ulcer of the Lower] [9:Diabetic Wound/Ulcer of the Lower] Primary Etiology: [12:Extremity Glaucoma, Chronic sinus] [13:Extremity Glaucoma, Chronic sinus] [9:Extremity Glaucoma, Chronic sinus] Comorbid History: [12:problems/congestion, Anemia, Sleep problems/congestion, Anemia, Sleep Apnea, Hypertension, Type II Diabetes, Rheumatoid Arthritis, Neuropathy 11/04/2019] [13:Apnea, Hypertension, Type II Diabetes, Rheumatoid Arthritis, Neuropathy  11/04/2019] [9:problems/congestion, Anemia, Sleep Apnea, Hypertension, Type II Diabetes, Rheumatoid Arthritis, Neuropathy 06/18/2018] Date Acquired: [12:2] [13:2] [9:42] Weeks of Treatment: [12:Open] [13:Healed - Epithelialized] [9:Open] Wound Status: [12:1.5x0.5x0.1] [13:0x0x0] [9:1.2x1.3x0.4] Measurements L x W x D (cm) [12:0.589] [13:0] [9:1.225] A (cm) : rea [12:0.059] [13:0] [9:0.49] Volume (cm) : [12:58.30%] [13:100.00%] [9:88.90%] % Reduction in A rea: [12:58.20%] [13:100.00%] [9:95.00%] % Reduction in Volume: [12:Grade 1] [13:Grade 1] [9:Grade 2] Classification: [12:Medium] [13:None Present] [9:Medium] Exudate A mount: [12:Serosanguineous] [13:N/A] [9:Serosanguineous] Exudate Type: [12:red, brown] [13:N/A] [9:red, brown] Exudate Color: [12:Flat and Intact] [13:Flat and Intact] [9:Thickened] Wound Margin: [12:Medium (34-66%)] [13:None Present (0%)] [9:Small (1-33%)] Granulation A mount: [12:Red, Pink] [13:N/A] [9:Red, Pale] Granulation Quality: [12:Medium (34-66%)] [13:None Present (0%)] [9:Large (67-100%)] Necrotic A mount: [12:Fat Layer (Subcutaneous Tissue): Yes Fascia: No] [9:Fat Layer (Subcutaneous Tissue): Yes] Exposed  Structures: [12:Fascia: No Tendon: No Muscle: No Joint: No Bone: No Small (1-33%)] [13:Fat Layer (Subcutaneous Tissue): No Tendon: No Muscle: No Joint: No Bone: No Large (67-100%)] [9:Fascia: No Tendon: No Muscle: No Joint: No Bone: No None] Epithelialization: [12:N/A] [13:N/A] [9:Debridement - Selective/Open Wound] Debridement: Pre-procedure Verification/Time Out N/A [13:N/A] [9:15:04] Taken: [12:N/A] [13:N/A] [9:Callus] Tissue Debrided: [12:N/A] [13:N/A] [9:Skin/Epidermis] Level: [12:N/A] [13:N/A] [9:1.56] Debridement A (sq cm): [12:rea N/A] [13:N/A] [9:Curette] Instrument: [12:N/A] [13:N/A] [9:Minimum] Bleeding: [12:N/A] [13:N/A] [9:Pressure] Hemostasis A chieved: [12:N/A] [13:N/A] [9:0] Procedural Pain: [12:N/A] [13:N/A] [9:0] Post Procedural Pain: [12:N/A] [13:N/A] [9:Procedure was tolerated well] Debridement Treatment Response: [12:N/A] [13:N/A] [9:1.2x1.3x0.4] Post Debridement Measurements L x W x D (cm) [12:N/A] [13:N/A] [9:0.49] Post Debridement Volume: (cm) [12:Compression Therapy] [13:N/A] [9:Compression Therapy] Procedures Performed: [9:Debridement] Treatment Notes Electronic Signature(s) Signed: 11/18/2019 5:37:06 PM By: Baltazar Najjar MD Signed: 11/18/2019 5:57:05 PM By: Zandra Abts RN, BSN Entered By: Baltazar Najjar on 11/18/2019 15:33:49 -------------------------------------------------------------------------------- Multi-Disciplinary Care Plan Details Patient Name: Date of Service: [name], [name] 11/18/2019 1:45 PM Medical Record Number: [id number] Patient Account Number: [id number] Date of Birth/Sex: Treating RN: [birth year] (64  y.o. [name] Primary Care Jerryl Holzhauer: MA SO UDI, Judene Companion MA LSA DA T Other Clinician: Referring Robinn Overholt: Treating Daenerys Buttram/Extender: Baltazar Najjar MA SO UDI, ELHA MA LSA DA T Weeks in Treatment: 83 Active Inactive Wound/Skin Impairment Nursing Diagnoses: Impaired tissue integrity Knowledge deficit related to  ulceration/compromised skin integrity Goals: Patient/caregiver will verbalize understanding of skin care regimen Date Initiated: 01/28/2019 Target Resolution Date: 11/29/2019 Goal Status: Active Interventions: Assess patient/caregiver ability to obtain necessary supplies Assess patient/caregiver ability to perform ulcer/skin care regimen upon admission and as needed Assess ulceration(s) every visit Provide education on ulcer and skin care Notes: Electronic Signature(s) Signed: 11/18/2019 5:57:05 PM By: Zandra Abts RN, BSN Entered By: Zandra Abts on 11/18/2019 14:32:39 -------------------------------------------------------------------------------- Pain Assessment Details Patient Name: Date of Service: [name], Makela J. 11/18/2019 1:45 PM Medical Record Number: [id number] Patient Account Number: [id number] Date of Birth/Sex: Treating RN: [birth year] (64 y.o. [name] Primary Care Mahdi Frye: MA SO UDI, [place] MA LSA DA T Other Clinician: Referring Jase Himmelberger: Treating Princetta Uplinger/Extender: Baltazar Najjar MA SO UDI, ELHA MA LSA DA T Weeks in Treatment: 38 Active Problems Location of Pain Severity and Description of Pain Patient Has Paino No Site Locations Rate the pain. Current Pain Level: 0 Pain Management and Medication Current Pain Management: Electronic Signature(s) Signed: 11/18/2019 5:46:37 PM By: Zenaida Deed RN, BSN Entered By: Zenaida Deed on 11/18/2019 14:00:11 -------------------------------------------------------------------------------- Patient/Caregiver Education Details Patient Name: Date of Service: [name] 11/1/2021andnbsp1:45 PM Medical Record Number: [id number] Patient Account Number: [id number] Date of Birth/Gender: Treating RN: 1955-07-24 (64 y.o. [name] Primary Care Physician: MA SO UDI, Judene Companion MA LSA DA T Other Clinician: Referring Physician: Treating Physician/Extender: Baltazar Najjar MA SO UDI, ELHA MA LSA DA  T Weeks in Treatment: 32 Education Assessment Education Provided To: Patient Education Topics Provided Wound/Skin Impairment: Methods: Explain/Verbal Responses: State content correctly [employer]) Signed: 11/18/2019 5:57:05 PM By: Zandra Abts RN, BSN Entered By: Zandra Abts on 11/18/2019 14:32:52 -------------------------------------------------------------------------------- Wound Assessment Details Patient Name: Date of Service: [name], [name] 11/18/2019 1:45 PM Medical Record Number: [id number] Patient Account Number: [id number] Date of Birth/Sex: Treating RN: Jul 01, 1955 (64 y.o. [name] Primary Care Hakiem Malizia: MA SO UDI, [place] MA LSA DA T Other Clinician: Referring Johathan Province: Treating Geni Skorupski/Extender: Baltazar Najjar MA SO UDI, ELHA MA LSA DA T Weeks in Treatment: 42 Wound Status Wound Number: 12 Primary Diabetic Wound/Ulcer of the Lower Extremity Etiology: Wound Location: Right, Medial Malleolus Wound Open Wounding Event: Trauma Status: Date Acquired: 11/04/2019 Comorbid Glaucoma, Chronic sinus problems/congestion, Anemia, Sleep Weeks Of Treatment: 2 History: Apnea, Hypertension, Type II Diabetes, Rheumatoid Arthritis, Clustered Wound: No Neuropathy Wound Measurements Length: (cm) 1.5 Width: (cm) 0.5 Depth: (cm) 0.1 Area: (cm) 0.589 Volume: (cm) 0.059 % Reduction in Area: 58.3% % Reduction in Volume: 58.2% Epithelialization: Small (1-33%) Tunneling: No Undermining: No Wound Description Classification: Grade 1 Wound Margin: Flat and Intact Exudate Amount: Medium Exudate Type: Serosanguineous Exudate Color: red, brown Foul Odor After Cleansing: No Slough/Fibrino Yes Wound Bed Granulation Amount: Medium (34-66%) Exposed Structure Granulation Quality: Red, Pink Fascia Exposed: No Necrotic Amount: Medium (34-66%) Fat Layer (Subcutaneous Tissue) Exposed: Yes Necrotic Quality: Adherent Slough Tendon Exposed: No Muscle  Exposed: No Joint Exposed: No Bone Exposed: No Treatment Notes Wound #12 (Right, Medial Malleolus) 1. Cleanse With Wound Cleanser Soap and water 2. Periwound Care Moisturizing lotion 3. Primary Dressing Applied Polymem Ag 4. Secondary Dressing ABD Pad Dry Gauze Foam Kerramax/Xtrasorb 6. Support Layer Applied 3 layer compression wrap Notes  secondary dressing foam donut. stockinette. Electronic Signature(s) Signed: 11/18/2019 5:46:37 PM By: Zenaida Deed RN, BSN Entered By: Zenaida Deed on 11/18/2019 14:11:50 -------------------------------------------------------------------------------- Wound Assessment Details Patient Name: Date of Service: [name], [name] 11/18/2019 1:45 PM Medical Record Number: [id number] Patient Account Number: [id number] Date of Birth/Sex: Treating RN: [birth year] (64 y.o. [name] Primary Care Nasra Counce: MA SO UDI, [place] MA LSA DA T Other Clinician: Referring Augusta Hilbert: Treating Daney Moor/Extender: Baltazar Najjar MA SO UDI, ELHA MA LSA DA T Weeks in Treatment: 42 Wound Status Wound Number: 13 Primary Diabetic Wound/Ulcer of the Lower Extremity Etiology: Wound Location: Right, Anterior Lower Leg Wound Healed - Epithelialized Wounding Event: Trauma Status: Date Acquired: 11/04/2019 Comorbid Glaucoma, Chronic sinus problems/congestion, Anemia, Sleep Weeks Of Treatment: 2 History: Apnea, Hypertension, Type II Diabetes, Rheumatoid Arthritis, Clustered Wound: No Neuropathy Wound Measurements Length: (cm) Width: (cm) Depth: (cm) Area: (cm) Volume: (cm) 0 % Reduction in Area: 100% 0 % Reduction in Volume: 100% 0 Epithelialization: Large (67-100%) 0 Tunneling: No 0 Undermining: No Wound Description Classification: Grade 1 Wound Margin: Flat and Intact Exudate Amount: None Present Foul Odor After Cleansing: No Slough/Fibrino No Wound Bed Granulation Amount: None Present (0%) Exposed Structure Necrotic Amount: None  Present (0%) Fascia Exposed: No Fat Layer (Subcutaneous Tissue) Exposed: No Tendon Exposed: No Muscle Exposed: No Joint Exposed: No Bone Exposed: No Electronic Signature(s) Signed: 11/18/2019 5:46:37 PM By: Zenaida Deed RN, BSN Entered By: Zenaida Deed on 11/18/2019 14:12:15 -------------------------------------------------------------------------------- Wound Assessment Details Patient Name: Date of Service: [name], [name] 11/18/2019 1:45 PM Medical Record Number: [id number] Patient Account Number: [id number] Date of Birth/Sex: Treating RN: 06/29/1955 (64 y.o. [name] Primary Care Vihan Santagata: MA SO UDI, [place] MA LSA DA T Other Clinician: Referring Geraldean Walen: Treating Gimena Buick/Extender: Baltazar Najjar MA SO UDI, ELHA MA LSA DA T Weeks in Treatment: 42 Wound Status Wound Number: 9 Primary Diabetic Wound/Ulcer of the Lower Extremity Etiology: Wound Location: Right, Medial Foot Wound Open Wounding Event: Blister Status: Date Acquired: 06/18/2018 Comorbid Glaucoma, Chronic sinus problems/congestion, Anemia, Sleep Weeks Of Treatment: 42 History: Apnea, Hypertension, Type II Diabetes, Rheumatoid Arthritis, Clustered Wound: No Neuropathy Wound Measurements Length: (cm) 1.2 Width: (cm) 1.3 Depth: (cm) 0.4 Area: (cm) 1.225 Volume: (cm) 0.49 % Reduction in Area: 88.9% % Reduction in Volume: 95% Epithelialization: None Tunneling: No Undermining: No Wound Description Classification: Grade 2 Wound Margin: Thickened Exudate Amount: Medium Exudate Type: Serosanguineous Exudate Color: red, brown Foul Odor After Cleansing: No Slough/Fibrino No Wound Bed Granulation Amount: Small (1-33%) Exposed Structure Granulation Quality: Red, Pale Fascia Exposed: No Necrotic Amount: Large (67-100%) Fat Layer (Subcutaneous Tissue) Exposed: Yes Necrotic Quality: Adherent Slough Tendon Exposed: No Muscle Exposed: No Joint Exposed: No Bone Exposed: No Treatment  Notes Wound #9 (Right, Medial Foot) 1. Cleanse With Wound Cleanser Soap and water 2. Periwound Care Moisturizing lotion 3. Primary Dressing Applied Polymem Ag 4. Secondary Dressing ABD Pad Dry Gauze Foam Kerramax/Xtrasorb 6. Support Layer Applied 3 layer compression wrap Notes secondary dressing foam donut. stockinette. Electronic Signature(s) Signed: 11/18/2019 5:46:37 PM By: Zenaida Deed RN, BSN Entered By: Zenaida Deed on 11/18/2019 14:12:57 -------------------------------------------------------------------------------- Vitals Details Patient Name: Date of Service: [name], [name] 11/18/2019 1:45 PM Medical Record Number: [id number] Patient Account Number: [id number] Date of Birth/Sex: Treating RN: 1955-08-23 (64 y.o. [name] Primary Care Lars Jeziorski: MA SO UDI, [place] MA LSA DA T Other Clinician: Referring Izamar Linden: Treating Maria Gallicchio/Extender: Baltazar Najjar MA SO UDI, ELHA MA LSA DA T Weeks in Treatment: 85 Vital  Signs Time Taken: 13:59 Temperature (F): 98.2 Height (in): 68 Pulse (bpm): 76 Source: Stated Respiratory Rate (breaths/min): 18 Weight (lbs): 265 Blood Pressure (mmHg): 128/75 Source: Stated Capillary Blood Glucose (mg/dl): 098 Body Mass Index (BMI): 40.3 Reference Range: 80 - 120 mg / dl Electronic Signature(s) Signed: 11/18/2019 5:46:37 PM By: Zenaida Deed RN, BSN Entered By: Zenaida Deed on 11/18/2019 13:59:59

## 2019-11-25 ENCOUNTER — Other Ambulatory Visit: Payer: Self-pay

## 2019-11-25 ENCOUNTER — Encounter (HOSPITAL_BASED_OUTPATIENT_CLINIC_OR_DEPARTMENT_OTHER): Payer: Medicare Other | Admitting: Internal Medicine

## 2019-11-25 DIAGNOSIS — E11621 Type 2 diabetes mellitus with foot ulcer: Secondary | ICD-10-CM | POA: Diagnosis not present

## 2019-11-26 NOTE — Progress Notes (Signed)
[name], [name] ([id number]) Visit Report for 11/25/2019 Debridement Details Patient Name: Date of Service: [name], [name] 11/25/2019 1:45 PM Medical Record Number: [id number] Patient Account Number: [id number] Date of Birth/Sex: Treating RN: 1955/11/05 (64 y.o. [name] Primary Care Provider: MA SO UDI, [place] MA LSA DA T Other Clinician: Referring Provider: Treating Provider/Extender: Abe People, ELHA MA LSA DA T Weeks in Treatment: 32 Debridement Performed for Assessment: Wound #9 Right,Medial Foot Performed By: Physician [name], MD Debridement Type: Debridement Severity of Tissue Pre Debridement: Fat layer exposed Level of Consciousness (Pre-procedure): Awake and Alert Pre-procedure Verification/Time Out Yes - 14:19 Taken: Start Time: 14:19 T Area Debrided (L x W): otal 1 (cm) x 1.4 (cm) = 1.4 (cm) Tissue and other material debrided: Viable, Non-Viable, Callus, Subcutaneous Level: Skin/Subcutaneous Tissue Debridement Description: Excisional Instrument: Curette Bleeding: Moderate Hemostasis Achieved: Silver Nitrate End Time: 14:21 Procedural Pain: 0 Post Procedural Pain: 0 Response to Treatment: Procedure was tolerated well Level of Consciousness (Post- Awake and Alert procedure): Post Debridement Measurements of Total Wound Length: (cm) 1 Width: (cm) 1.4 Depth: (cm) 0.4 Volume: (cm) 0.44 Character of Wound/Ulcer Post Debridement: Improved Severity of Tissue Post Debridement: Fat layer exposed Post Procedure Diagnosis Same as Pre-procedure Electronic Signature(s) Signed: 11/25/2019 6:16:40 PM By: Zandra Abts RN, BSN Signed: 11/26/2019 3:11:24 PM By: Baltazar Najjar MD Entered By: Baltazar Najjar on 11/25/2019 14:59:31 -------------------------------------------------------------------------------- HPI Details Patient Name: Date of Service: Sarah CH, Charise J. 11/25/2019 1:45 PM Medical Record Number: [id number] Patient  Account Number: [id number] Date of Birth/Sex: Treating RN: [birth year] (64 y.o. [name] Primary Care Provider: MA SO UDI, [place] MA LSA DA T Other Clinician: Referring Provider: Treating Provider/Extender: Baltazar Najjar MA SO UDI, ELHA MA LSA DA T Weeks in Treatment: 49 History of Present Illness HPI Description: 64 yrs old bf here for follow up recurrent left charcot foot ulcer. She has DM. She is not a smoker. She states a blister developed 3 months ago at site of previous breakdown from 1 year ago. It was debrided at the podiatrist's office. She went to the ER and then sent here. She denies pain. Xray was negative for osteo. Culture from this clinic negative. 09/08/14 [address] [name] to [name] for Charcot foot. Seen by him and MRI scheduled 09/19/14. Prior silver collagen but this was not ordered last visit, patient has been rinsing with saline alone. Re institute silver collagen every other day. F/u 2 weeks with [name] as Labor Day holiday. Supplies ordered. 09/25/14; this is a patient with a Charcot foot on the left. she has a wound over the plantar aspect of her left calcaneus. She has been seen by [name] of orthopedic surgery and has had an MRI. She is apparently going within the next week or 2 for corrective surgery which will involve an external fixator. I don't have any of the details of this. 10/06/14; The patient is a 64 yrs old bf with a left Charcot foot and ulcer on the plantar. 12/01/14 Returns post surgery with [name]. Has follow up visit later this week with him, currently in facility and NWB over this extremity. States no drainage from foot and no current wound care. On exam callus and scab in place, suspect healed. Has external fixator in place. Will defer to [name] for management, ok to place moisturizing cream over scabs and callus and she will call if she requires follow up here. READMISSION 01/28/2019 Patient was in  the clinic in 2016 for a  wound on the plantar aspect of her left calcaneus. Predominantly cared for by [name] she was referred to [name] and had corrective surgery for the position of her left ankle I believe. She had previously been here in 2015 and then prior to that in 2011 2012 that I do not have these records. More recently she has developed fairly extensive wounds on the right medial foot, left lateral foot and a small area on the right medial great toe. Right medial foot wound has been there for 6 to 7 months, left foot wound for 2 to 3 months and the right great toe only over the last few weeks. She has been followed by [name] at Dr. Laverta [place] office it sounds as though she has been undergoing callus paring and silver nitrate. The patient has been washing these off with soap and water and plan applying dry dressing Past medical history, type 2 diabetes well controlled with a recent hemoglobin A1c of 7 on 11/16, type 2 diabetes with peripheral neuropathy, previous left Charcot joint surgery bilateral Charcot joints currently, stage III chronic renal failure, hypertension, obstructive sleep apnea, history of MRSA and gastroesophageal reflux. 1/18; this is a patient with bilateral Charcot feet. Plain x-rays that I did of both of these wounds that are either at bone or precariously close to bone were done. On the right foot this showed possible lytic destruction involving the anterior aspect of the talus which may represent a wound osteomyelitis. An MRI was recommended. On the left there was no definite lytic destruction although the wound is deep undermining's and I think probably requires an MRI on this basis in any case 1/25; patient was supposed to have her MRI last Friday of her bilateral feet however they would not do it because there was silver alginate in her dressings, will replace with calcium alginate today and will see if we can get her done today 2/1; patient did not get her MRIs done last  week because of issues with the MRI department receiving orders to do bilateral feet. She has 2 deep wounds in the setting of Charcot feet deformities bilaterally. Both of them at one point had exposed bone although I had trouble demonstrating that today. I am not sure that we can offload these very aggressively as I watched her leave the clinic last week her gait is very narrow and unsteady. 2/15; the MRI of her right foot did not show osteomyelitis potentially osteonecrosis of the talus. 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 The wounds are a large wound on the right medial foot and on the left a substantial wound on the left lateral plantar calcaneus. We have been using silver alginate. Ultimately I think we need to consider a total contact cast on the right while we work through the possibility of osteomyelitis in the left. I watched her walk out with her crutches I have some trepidation about the ability to walk with the cast on her right foot. Nevertheless with her Charcot deformities I do not think there is a way to heal these short of offloading them aggressively. 2/22; the culture of the bone in the left foot that I did last week showed a few Citrobacter Koseri as well as some streptococcal species. In my attempted bone for pathology did not actually yield a lot of bone the pathologist did not identify any osteomyelitis. 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 Is promised today and aggressive debridement of the right foot and a total contact cast which surprisingly she seemed to tolerate quite well 03/13/2019 upon evaluation today patient appears to be doing well with her total contact cast she is here for the first cast change. She had no areas of rubbing or any other complications at this  point. Overall things are doing quite well. 2/26; the patient was kindly seen by Dr. Orvan Falconer of infectious disease on 2/25. She is now on IV vancomycin PICC line placement in 2 days and oral Flagyl. This was in response to her MRI showing osteomyelitis of the left calcaneus concerning for osteomyelitis. We have been putting her right leg in a total contact cast. Our nurses report drainage. She is tolerating the total contact cast well. 3/4; the patient is started her IV antibiotics and is taking IV vancomycin and oral Flagyl. She appears to be tolerating these both well. This is for osteomyelitis involving the left calcaneus. The area on the right in the setting of bilateral Charcot deformities did not have any bone infection on MRI. We put a total contact cast on her with silver alginate as the primary dressing and she returns today in follow-up. Per our intake nurse there was too much drainage to leave this on all week therefore we will bring her back for an early change 3/8; follow-up total contact cast she is still having a lot of drainage probably too much drainage from the right foot wound will week with the same cast. She will be back on Thursday. The area on the left looks somewhat better 3/11; patient here for a total contact cast change. 3/15; patient came in for wound care evaluation. She is still on IV vancomycin and oral Flagyl for osteomyelitis in the left foot. The area on the right foot we are putting in a total contact cast. 3/18 for total contact cast change on the right foot 3/22; the patient was here for wound review today. The area on the right foot is slightly smaller in terms of surface area. However the surface of the wound is very gritty and hyper granulated. More problematically the area on the plantar left foot has increased depth indeed in the center of this it probes to bone. We have been using Hydrofera Blue in both areas. She is on antibiotics as prescribed by  infectious disease IV vancomycin and oral Flagyl 3/25; the patient is in for total contact cast change. Our intake nurse was concerned about the amount of drainage which soaked right to the multiple layers we are putting on this. Wondered about options. The wound bed actually looks as healthy as this is looked although there may not be a lot of change in dimensions things are looking a lot better. If we are going to heal this woman's wound which is in the middle of her right Charcot foot this is going to need to be offloaded. There are not many alternatives 3/29; still not a lot of improvement although the surface of the right wound looks better. We have been using Hydrofera Blue 04/17/2019 upon evaluation today patient appears to be doing well with a total contact cast. With actually having to change this twice a week. She saw [name] for wound care earlier in the week this is for the cast change today. That is  due to the fact that she has significant drainage. Overall the wound is been looking excellent however. 4/5; we continue to put a total contact cast on this patient with Hydrofera Blue. Still a lot of drainage which precludes a simple weekly change or changing this twice a week. 4/8; total contact cast changed today. Apparently the wound looks better per our intake nurse 4/12; patient here for wound care evaluation. Wounds on the right foot have not changed in dimension none about a month left foot looks similar. Apparently her antibiotics that we are giving her for osteomyelitis in the left foot complete on Wednesday. We have been using Hydrofera Blue on the right foot under a total contact cast. Nurses report greenish drainage although I think this may be discoloration from the [facility] 4/15; back for cast change wounds look quite good although still moderate amount of drainage. T contact cast reapplied otal 4/19;. Wound evaluation. Wound on the right foot is smaller surface looks  healthy. There is still the divot in the middle part of this wound but I could not feel any bone. Area on the left foot also looks better She has completed her antibiotics but still has the PICC line in. 4/23; patient comes back for a total contact cast change this was done in the standard fashion no issues 4/26; wound measures slightly larger on the right foot which is disappointing. She sees Dr. Orvan Falconer with regards to her antibiotics on Wednesday. I believe she is on vancomycin IV and oral Flagyl. 4/29; in for her routine total contact cast change. She saw Dr. Orvan Falconer this morning. He is asking about antibiotic continuation I will be in touch with him. I did not look at her wound today 5/3; patient saw Dr. Orvan Falconer on 4/28. I did send him a note asking about the continuation of perhaps an oral regimen. I have not heard back. She still has an elevated sedimentation rate at 81. Her wounds are smaller. 5/6; in for her total contact cast change. We did not look at the wound today. She has seen Dr. Orvan Falconer and is now on oral Keflex and Flagyl. She seems to be tolerating these well 5/10; she had her PICC line removed. The wound on the right foot after some initial improvement has not made any improvement even with the total contact cast. Still having a lot of drainage. Likewise the area on the left foot really is not any better either 5/14; in for a cast change today. We will look at the right foot on Monday. If this is not making any progress I am going to try to cast the left foot. 5/17; the wound on the right foot is absolutely no different. I am going to put ointment on this area and change the cast of the left foot. We have been using silver alginate there 5/21; apparently the area on the right foot was better per our intake nurse although I did not see it. We did a standard total contact cast change on the left foot we have been applying silver alginate to the wound here. 06/10/19-Area on the  right foot per dimensions slightly better, the left foot has still some depth applying silver alginate to the wound with TCC 06/13/19-Patient here for Casting to Left foot 6/1; patient had the total contact cast were placed on the left foot. Noted some swelling in her right leg although the dimensions of the wound on the right foot are better. We have been using PolyMem on  the right and silver alginate on the left under the cast 6/7; we continue to make nice progress in both her plantar feet wound. We are putting the total contact cast on the left. Surprisingly in spite of this the area on the right medial foot in the middle of her Charcot deformities actually is making progress as well 07/01/2019 upon evaluation today patient actually appears to be doing quite well with regard to her wounds on the feet compared to when I saw her last that has been quite some time. With that being said I do believe the total contact cast has been beneficial in this left foot and she has a lot of signs of improvement she does have some callus overhanging which is caused a little bit of a ledge and an issue here. I do believe that we may be able to do something to help in that regard. Fortunately otherwise the wound seems to be progressing quite nicely. 6/21; the patient's area on the left foot that we are currently casting is just about closed. The area on the right medial foot has a healthy looking surface but may be not changed as much from last week in terms of surface area. We are using silver alginate on the right and polymen Ag under the cast 6/28; only a small open area on the left foot remains. Using silver alginate under a total contact cast. She has a diabetic shoe I have asked her to bring that for next week where this may be closed. The area on the right foot with a Charcot deformity is also measuring slightly smaller. We are using PolyMem Ag here 7/6; left foot is totally closed. She does not require a total  contact cast. She will graduate to her own diabetic shoe. The area on the right foot with a Charcot deformity measuring about the same. We have been using polymen under compression wraps. She did not do well in this area with a total contact cast 7/13; left foot remains intact. She is skillfully covering this area with foam and using her diabetic shoe. Slight area improvement of the right foot wound. Still rolled senescent looking edges. We have been using polymen 7/19; left foot remains intact. Wound measuring slightly smaller on the right foot in the middle of the Charcot deformity. We have been using PolyMem Ag 7/26; per the patient left foot remains intact. The wound on the right foot looks slightly smaller but dimensions are measuring the same. We have been using polymen Ag 8/9-Patient back after 2 weeks, wound of the right foot again looking slightly smaller, continuing to use PolyMem silver 8/16; patient has made some minor improvements in wound area on the right foot. Using polymen Ag. The left foot remains healed 8/23; continued slight improvement in dimensions. Rolled edges around the wound edge noted. We have been using polymen Ag. Patient offloading with a surgical shoe and crutches 8/30; no change in surface area. Thick rolled skin and subcutaneous tissue around the edges debris on the wound surface. We have been using polymen Ag initially with some improvement however stalled on the last 2 weeks in terms of surface area 9/13; surface area is smaller still using polymen Ag. Thick rolled skin and subcutaneous tissue around the edges requiring debridement. 9/20; again surface area is improved using polymen Ag however each week she develops thick rolled skin and subcutaneous tissue around the wound edges requiring debridement. She is offloading this using crutches 9/27; we've been using PolyMem AG. Wound  does not look any different from last week. Raised edges of callus skin and  subcutaneous tissue around the wound bed. 10/5; we been using polymen Ag. No ability to put a total contact cast on today in the clinic. Wound is about the same still rolled edges of thick callus around the margins 10/18; we put her in a total contact cast last week things did not go well. Not only does the wound looked worse macerated angry with thick callus and some warmth around it. She had another wound above this and one on the anterior right leg. I am not totally certain why this happened. She has not been systemically unwell 10/25; culture from last week grew Pseudomonas and MRSA. My experience with this MRSA is usually a true infection and Pseudomonas a colonizer I am going to put her on doxycycline today we put gentamicin on the wound surface. Continuing with silver alginate 11/1; she has tolerated her antibiotics. I gave her doxycycline. Was still using gentamicin. The original wound looks about the same thick rolled edges. The cast injury just above it is superficial 11/8; even after an aggressive debridement last week the wound on her medial Charcot foot on the right looks about the same. The surface looks satisfactory however rolled edges of callus thick skin and subcutaneous tissue. She did not do well on 2 separate occasions in a total contact cast. The cast abrasion she has on the medial foot is just above closed. We have been using polymen Ag. She is attempting to offload this is much as she can with her crutches Electronic Signature(s) Signed: 11/26/2019 3:11:24 PM By: Baltazar Najjar MD Entered By: Baltazar Najjar on 11/25/2019 15:00:54 -------------------------------------------------------------------------------- Physical Exam Details Patient Name: Date of Service: Sarah CH, Cozetta J. 11/25/2019 1:45 PM Medical Record Number: [id number] Patient Account Number: [id number] Date of Birth/Sex: Treating RN: 04-04-1955 (64 y.o. [name] Primary Care Provider: MA SO UDI,  [place] MA LSA DA T Other Clinician: Referring Provider: Treating Provider/Extender: Baltazar Najjar MA SO UDI, ELHA MA LSA DA T Weeks in Treatment: 68 Constitutional Sitting or standing Blood Pressure is within target range for patient.. Pulse regular and within target range for patient.Marland Kitchen Respirations regular, non-labored and within target range.. Temperature is normal and within the target range for the patient.Marland Kitchen Appears in no distress. Notes Wound exam; right medial plantar foot. Again the thick rolled edges of callus skin and subcutaneous tissue I removed with a #5 curette. Debriding the surface of the fibrinous debris cleans it up quite nicely but we have been unable to get any epithelialization over the wound. Just superior to this is the remnant of the cast injury I think this will be closed by next week Electronic Signature(s) Signed: 11/26/2019 3:11:24 PM By: Baltazar Najjar MD Entered By: Baltazar Najjar on 11/25/2019 15:02:30 -------------------------------------------------------------------------------- Physician Orders Details Patient Name: Date of Service: Sarah CH, Stellarose J. 11/25/2019 1:45 PM Medical Record Number: [id number] Patient Account Number: [id number] Date of Birth/Sex: Treating RN: July 01, 1955 (64 y.o. [name] Primary Care Provider: MA SO UDI, [place] MA LSA DA T Other Clinician: Referring Provider: Treating Provider/Extender: Baltazar Najjar MA SO UDI, ELHA MA LSA DA T Weeks in Treatment: 18 Verbal / Phone Orders: No Diagnosis Coding ICD-10 Coding Code Description E11.621 Type 2 diabetes mellitus with foot ulcer L97.511 Non-pressure chronic ulcer of other part of right foot limited to breakdown of skin E11.42 Type 2 diabetes mellitus with diabetic polyneuropathy M14.671 Charcot's joint, right ankle and foot Follow-up Appointments  Return Appointment in 1 week. Dressing Change Frequency Do not change entire dressing for one week. Wound Cleansing May  shower with protection. - use cast protector Primary Wound Dressing Wound #12 Right,Medial Malleolus Polymem Silver Wound #9 Right,Medial Foot Polymem Silver Secondary Dressing Wound #12 Right,Medial Malleolus Dry Gauze Wound #9 Right,Medial Foot ABD pad Zetuvit or Kerramax Other: - felt callous pad Edema Control 3 Layer Compression System - Right Lower Extremity Avoid standing for long periods of time Elevate legs to the level of the heart or above for 30 minutes daily and/or when sitting, a frequency of: - throughout the day Exercise regularly Off-Loading Open toe surgical shoe to: - right foot Electronic Signature(s) Signed: 11/25/2019 6:16:40 PM By: Zandra Abts RN, BSN Signed: 11/26/2019 3:11:24 PM By: Baltazar Najjar MD Entered By: Zandra Abts on 11/25/2019 14:26:02 -------------------------------------------------------------------------------- Problem List Details Patient Name: Date of Service: Sarah CH, Gwenlyn J. 11/25/2019 1:45 PM Medical Record Number: [id number] Patient Account Number: [id number] Date of Birth/Sex: Treating RN: [birth year] (65 y.o. [name] Primary Care Provider: MA SO UDI, [place] MA LSA DA T Other Clinician: Referring Provider: Treating Provider/Extender: Abe People, ELHA MA LSA DA T Weeks in Treatment: 104 Active Problems ICD-10 Encounter Code Description Active Date MDM Diagnosis E11.621 Type 2 diabetes mellitus with foot ulcer 01/28/2019 No Yes L97.511 Non-pressure chronic ulcer of other part of right foot limited to breakdown of 01/28/2019 No Yes skin E11.42 Type 2 diabetes mellitus with diabetic polyneuropathy 01/28/2019 No Yes M14.671 Charcot's joint, right ankle and foot 01/28/2019 No Yes Inactive Problems ICD-10 Code Description Active Date Inactive Date L97.524 Non-pressure chronic ulcer of other part of left foot with necrosis of bone 01/28/2019 01/28/2019 L97.514 Non-pressure chronic ulcer of other part of  right foot with necrosis of bone 01/28/2019 01/28/2019 W09.811 Charcot's joint, left ankle and foot 01/28/2019 01/28/2019 B14.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 11/25/2019 1:45 PM Medical Record Number: [id number] Patient Account Number: [id number] Date of Birth/Sex: Treating RN: 1955/10/07 (64 y.o. [name] Primary Care Provider: MA SO UDI, [place] MA LSA DA T Other Clinician: Referring Provider: Treating Provider/Extender: Baltazar Najjar MA SO UDI, ELHA MA LSA DA T Weeks in Treatment: 106 Subjective History of Present Illness (HPI) 64 yrs old bf here for follow up recurrent left charcot foot ulcer. She has DM. She is not a smoker. She states a blister developed 3 months ago at site of previous breakdown from 1 year ago. It was debrided at the podiatrist's office. She went to the ER and then sent here. She denies pain. Xray was negative for osteo. Culture from this clinic negative. 09/08/[name] to [name] for Charcot foot. Seen by him and MRI scheduled 09/19/14. Prior silver collagen but this was not ordered last visit, patient has been rinsing with saline alone. Re institute silver collagen every other day. F/u 2 weeks with [name] as Labor Day holiday. Supplies ordered. 09/25/14; this is a patient with a Charcot foot on the left. she has a wound over the plantar aspect of her left calcaneus. She has been seen by [name] of orthopedic surgery and has had an MRI. She is apparently going within the next week or 2 for corrective surgery which will involve an external fixator. I  don't have any of the details of this. 10/06/14; The  patient is a 64 yrs old bf with a left Charcot foot and ulcer on the plantar. 12/01/14 Returns post surgery with [name]. Has follow up visit later this week with him, currently in facility and NWB over this extremity. States no drainage from foot and no current wound care. On exam callus and scab in place, suspect healed. Has external fixator in place. Will defer to [name] for management, ok to place moisturizing cream over scabs and callus and she will call if she requires follow up here. READMISSION 01/28/2019 Patient was in the clinic in 2016 for a wound on the plantar aspect of her left calcaneus. Predominantly cared for by [name] she was referred to [name] and had corrective surgery for the position of her left ankle I believe. She had previously been here in 2015 and then prior to that in 2011 2012 that I do not have these records. More recently she has developed fairly extensive wounds on the right medial foot, left lateral foot and a small area on the right medial great toe. Right medial foot wound has been there for 6 to 7 months, left foot wound for 2 to 3 months and the right great toe only over the last few weeks. She has been followed by [name] at Dr. Laverta [place] office it sounds as though she has been undergoing callus paring and silver nitrate. The patient has been washing these off with soap and water and plan applying dry dressing Past medical history, type 2 diabetes well controlled with a recent hemoglobin A1c of 7 on 11/16, type 2 diabetes with peripheral neuropathy, previous left Charcot joint surgery bilateral Charcot joints currently, stage III chronic renal failure, hypertension, obstructive sleep apnea, history of MRSA and gastroesophageal reflux. 1/18; this is a patient with bilateral Charcot feet. Plain x-rays that I did of both of these wounds that are either at bone or precariously close to bone were done. On the right foot this showed possible  lytic destruction involving the anterior aspect of the talus which may represent a wound osteomyelitis. An MRI was recommended. On the left there was no definite lytic destruction although the wound is deep undermining's and I think probably requires an MRI on this basis in any case 1/25; patient was supposed to have her MRI last Friday of her bilateral feet however they would not do it because there was silver alginate in her dressings, will replace with calcium alginate today and will see if we can get her done today 2/1; patient did not get her MRIs done last week because of issues with the MRI department receiving orders to do bilateral feet. She has 2 deep wounds in the setting of Charcot feet deformities bilaterally. Both of them at one point had exposed bone although I had trouble demonstrating that today. I am not sure that we can offload these very aggressively as I watched her leave the clinic last week her gait is very narrow and unsteady. 2/15; the MRI of her right foot did not show osteomyelitis potentially osteonecrosis of the talus. 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 The wounds are a large wound on the right medial foot and on the left a substantial wound on the left lateral plantar calcaneus. We have been using silver alginate. Ultimately I think we need to consider a total contact cast on the right while we work through  the possibility of osteomyelitis in the left. I watched her walk out with her crutches I have some trepidation about the ability to walk with the cast on her right foot. Nevertheless with her Charcot deformities I do not think there is a way to heal these short of offloading them aggressively. 2/22; the culture of the bone in the left foot that I did last week showed a few Citrobacter Koseri as well as some streptococcal species. In my attempted bone for pathology did not actually yield a lot of bone the  pathologist did not identify any osteomyelitis. 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 Is promised today and aggressive debridement of the right foot and a total contact cast which surprisingly she seemed to tolerate quite well 03/13/2019 upon evaluation today patient appears to be doing well with her total contact cast she is here for the first cast change. She had no areas of rubbing or any other complications at this point. Overall things are doing quite well. 2/26; the patient was kindly seen by Dr. Orvan Falconer of infectious disease on 2/25. She is now on IV vancomycin PICC line placement in 2 days and oral Flagyl. This was in response to her MRI showing osteomyelitis of the left calcaneus concerning for osteomyelitis. We have been putting her right leg in a total contact cast. Our nurses report drainage. She is tolerating the total contact cast well. 3/4; the patient is started her IV antibiotics and is taking IV vancomycin and oral Flagyl. She appears to be tolerating these both well. This is for osteomyelitis involving the left calcaneus. The area on the right in the setting of bilateral Charcot deformities did not have any bone infection on MRI. We put a total contact cast on her with silver alginate as the primary dressing and she returns today in follow-up. Per our intake nurse there was too much drainage to leave this on all week therefore we will bring her back for an early change 3/8; follow-up total contact cast she is still having a lot of drainage probably too much drainage from the right foot wound will week with the same cast. She will be back on Thursday. The area on the left looks somewhat better 3/11; patient here for a total contact cast change. 3/15; patient came in for wound care evaluation. She is still on IV vancomycin and oral  Flagyl for osteomyelitis in the left foot. The area on the right foot we are putting in a total contact cast. 3/18 for total contact cast change on the right foot 3/22; the patient was here for wound review today. The area on the right foot is slightly smaller in terms of surface area. However the surface of the wound is very gritty and hyper granulated. More problematically the area on the plantar left foot has increased depth indeed in the center of this it probes to bone. We have been using Hydrofera Blue in both areas. She is on antibiotics as prescribed by infectious disease IV vancomycin and oral Flagyl 3/25; the patient is in for total contact cast change. Our intake nurse was concerned about the amount of drainage which soaked right to the multiple layers we are putting on this. Wondered about options. The wound bed actually looks as healthy as this is looked although there may not be a lot of change  in dimensions things are looking a lot better. If we are going to heal this woman's wound which is in the middle of her right Charcot foot this is going to need to be offloaded. There are not many alternatives 3/29; still not a lot of improvement although the surface of the right wound looks better. We have been using Hydrofera Blue 04/17/2019 upon evaluation today patient appears to be doing well with a total contact cast. With actually having to change this twice a week. She saw [name] for wound care earlier in the week this is for the cast change today. That is due to the fact that she has significant drainage. Overall the wound is been looking excellent however. 4/5; we continue to put a total contact cast on this patient with Hydrofera Blue. Still a lot of drainage which precludes a simple weekly change or changing this twice a week. 4/8; total contact cast changed today. Apparently the wound looks better per our intake nurse 4/12; patient here for wound care evaluation. Wounds on the  right foot have not changed in dimension none about a month left foot looks similar. Apparently her antibiotics that we are giving her for osteomyelitis in the left foot complete on Wednesday. We have been using Hydrofera Blue on the right foot under a total contact cast. Nurses report greenish drainage although I think this may be discoloration from the [facility] 4/15; back for cast change wounds look quite good although still moderate amount of drainage. T contact cast reapplied otal 4/19;. Wound evaluation. Wound on the right foot is smaller surface looks healthy. There is still the divot in the middle part of this wound but I could not feel any bone. Area on the left foot also looks better She has completed her antibiotics but still has the PICC line in. 4/23; patient comes back for a total contact cast change this was done in the standard fashion no issues 4/26; wound measures slightly larger on the right foot which is disappointing. She sees Dr. Orvan Falconer with regards to her antibiotics on Wednesday. I believe she is on vancomycin IV and oral Flagyl. 4/29; in for her routine total contact cast change. She saw Dr. Orvan Falconer this morning. He is asking about antibiotic continuation I will be in touch with him. I did not look at her wound today 5/3; patient saw Dr. Orvan Falconer on 4/28. I did send him a note asking about the continuation of perhaps an oral regimen. I have not heard back. She still has an elevated sedimentation rate at 81. Her wounds are smaller. 5/6; in for her total contact cast change. We did not look at the wound today. She has seen Dr. Orvan Falconer and is now on oral Keflex and Flagyl. She seems to be tolerating these well 5/10; she had her PICC line removed. The wound on the right foot after some initial improvement has not made any improvement even with the total contact cast. Still having a lot of drainage. Likewise the area on the left foot really is not any better  either 5/14; in for a cast change today. We will look at the right foot on Monday. If this is not making any progress I am going to try to cast the left foot. 5/17; the wound on the right foot is absolutely no different. I am going to put ointment on this area and change the cast of the left foot. We have been using silver alginate there 5/21; apparently the area on  the right foot was better per our intake nurse although I did not see it. We did a standard total contact cast change on the left foot we have been applying silver alginate to the wound here. 06/10/19-Area on the right foot per dimensions slightly better, the left foot has still some depth applying silver alginate to the wound with TCC 06/13/19-Patient here for Casting to Left foot 6/1; patient had the total contact cast were placed on the left foot. Noted some swelling in her right leg although the dimensions of the wound on the right foot are better. We have been using PolyMem on the right and silver alginate on the left under the cast 6/7; we continue to make nice progress in both her plantar feet wound. We are putting the total contact cast on the left. Surprisingly in spite of this the area on the right medial foot in the middle of her Charcot deformities actually is making progress as well 07/01/2019 upon evaluation today patient actually appears to be doing quite well with regard to her wounds on the feet compared to when I saw her last that has been quite some time. With that being said I do believe the total contact cast has been beneficial in this left foot and she has a lot of signs of improvement she does have some callus overhanging which is caused a little bit of a ledge and an issue here. I do believe that we may be able to do something to help in that regard. Fortunately otherwise the wound seems to be progressing quite nicely. 6/21; the patient's area on the left foot that we are currently casting is just about closed. The  area on the right medial foot has a healthy looking surface but may be not changed as much from last week in terms of surface area. We are using silver alginate on the right and polymen Ag under the cast 6/28; only a small open area on the left foot remains. Using silver alginate under a total contact cast. She has a diabetic shoe I have asked her to bring that for next week where this may be closed. The area on the right foot with a Charcot deformity is also measuring slightly smaller. We are using PolyMem Ag here 7/6; left foot is totally closed. She does not require a total contact cast. She will graduate to her own diabetic shoe. ooThe area on the right foot with a Charcot deformity measuring about the same. We have been using polymen under compression wraps. She did not do well in this area with a total contact cast 7/13; left foot remains intact. She is skillfully covering this area with foam and using her diabetic shoe. Slight area improvement of the right foot wound. Still rolled senescent looking edges. We have been using polymen 7/19; left foot remains intact. Wound measuring slightly smaller on the right foot in the middle of the Charcot deformity. We have been using PolyMem Ag 7/26; per the patient left foot remains intact. The wound on the right foot looks slightly smaller but dimensions are measuring the same. We have been using polymen Ag 8/9-Patient back after 2 weeks, wound of the right foot again looking slightly smaller, continuing to use PolyMem silver 8/16; patient has made some minor improvements in wound area on the right foot. Using polymen Ag. The left foot remains healed 8/23; continued slight improvement in dimensions. Rolled edges around the wound edge noted. We have been using polymen Ag. Patient offloading with  a surgical shoe and crutches 8/30; no change in surface area. Thick rolled skin and subcutaneous tissue around the edges debris on the wound surface. We have  been using polymen Ag initially with some improvement however stalled on the last 2 weeks in terms of surface area 9/13; surface area is smaller still using polymen Ag. Thick rolled skin and subcutaneous tissue around the edges requiring debridement. 9/20; again surface area is improved using polymen Ag however each week she develops thick rolled skin and subcutaneous tissue around the wound edges requiring debridement. She is offloading this using crutches 9/27; we've been using PolyMem AG. Wound does not look any different from last week. Raised edges of callus skin and subcutaneous tissue around the wound bed. 10/5; we been using polymen Ag. No ability to put a total contact cast on today in the clinic. Wound is about the same still rolled edges of thick callus around the margins 10/18; we put her in a total contact cast last week things did not go well. Not only does the wound looked worse macerated angry with thick callus and some warmth around it. She had another wound above this and one on the anterior right leg. I am not totally certain why this happened. She has not been systemically unwell 10/25; culture from last week grew Pseudomonas and MRSA. My experience with this MRSA is usually a true infection and Pseudomonas a colonizer I am going to put her on doxycycline today we put gentamicin on the wound surface. Continuing with silver alginate 11/1; she has tolerated her antibiotics. I gave her doxycycline. Was still using gentamicin. The original wound looks about the same thick rolled edges. The cast injury just above it is superficial 11/8; even after an aggressive debridement last week the wound on her medial Charcot foot on the right looks about the same. The surface looks satisfactory however rolled edges of callus thick skin and subcutaneous tissue. She did not do well on 2 separate occasions in a total contact cast. The cast abrasion she has on the medial foot is just above closed.  We have been using polymen Ag. She is attempting to offload this is much as she can with her crutches Objective Constitutional Sitting or standing Blood Pressure is within target range for patient.. Pulse regular and within target range for patient.Marland Kitchen Respirations regular, non-labored and within target range.. Temperature is normal and within the target range for the patient.Marland Kitchen Appears in no distress. Vitals Time Taken: 1:53 PM, Height: 68 in, Source: Stated, Weight: 265 lbs, Source: Stated, BMI: 40.3, Temperature: 98.4 F, Pulse: 72 bpm, Respiratory Rate: 18 breaths/min, Blood Pressure: 114/72 mmHg, Capillary Blood Glucose: 130 mg/dl. General Notes: Wound exam; right medial plantar foot. Again the thick rolled edges of callus skin and subcutaneous tissue I removed with a #5 curette. Debriding the surface of the fibrinous debris cleans it up quite nicely but we have been unable to get any epithelialization over the wound. ooJust superior to this is the remnant of the cast injury I think this will be closed by next week Integumentary (Hair, Skin) Wound #12 status is Open. Original cause of wound was Trauma. The wound is located on the Right,Medial Malleolus. The wound measures 0.2cm length x 0.2cm width x 0.1cm depth; 0.031cm^2 area and 0.003cm^3 volume. The wound is limited to skin breakdown. There is no tunneling or undermining noted. There is a small amount of serous drainage noted. The wound margin is flat and intact. There is small (1-33%) pink  granulation within the wound bed. There is no necrotic tissue within the wound bed. Wound #9 status is Open. Original cause of wound was Blister. The wound is located on the Right,Medial Foot. The wound measures 1cm length x 1.4cm width x 0.4cm depth; 1.1cm^2 area and 0.44cm^3 volume. There is Fat Layer (Subcutaneous Tissue) exposed. There is no tunneling or undermining noted. There is a medium amount of serosanguineous drainage noted. The wound margin is  thickened. There is medium (34-66%) red, pale granulation within the wound bed. There is a medium (34-66%) amount of necrotic tissue within the wound bed including Adherent Slough. Assessment Active Problems ICD-10 Type 2 diabetes mellitus with foot ulcer Non-pressure chronic ulcer of other part of right foot limited to breakdown of skin Type 2 diabetes mellitus with diabetic polyneuropathy Charcot's joint, right ankle and foot Procedures Wound #9 Pre-procedure diagnosis of Wound #9 is a Diabetic Wound/Ulcer of the Lower Extremity located on the Right,Medial Foot .Severity of Tissue Pre Debridement is: Fat layer exposed. There was a Excisional Skin/Subcutaneous Tissue Debridement with a total area of 1.4 sq cm performed by [name], MD. With the following instrument(s): Curette to remove Viable and Non-Viable tissue/material. Material removed includes Callus and Subcutaneous Tissue and. No specimens were taken. A time out was conducted at 14:19, prior to the start of the procedure. A Moderate amount of bleeding was controlled with Silver Nitrate. The procedure was tolerated well with a pain level of 0 throughout and a pain level of 0 following the procedure. Post Debridement Measurements: 1cm length x 1.4cm width x 0.4cm depth; 0.44cm^3 volume. Character of Wound/Ulcer Post Debridement is improved. Severity of Tissue Post Debridement is: Fat layer exposed. Post procedure Diagnosis Wound #9: Same as Pre-Procedure Pre-procedure diagnosis of Wound #9 is a Diabetic Wound/Ulcer of the Lower Extremity located on the Right,Medial Foot . There was a Three Layer Compression Therapy Procedure by Zandra Abts, RN. Post procedure Diagnosis Wound #9: Same as Pre-Procedure Wound #12 Pre-procedure diagnosis of Wound #12 is a Diabetic Wound/Ulcer of the Lower Extremity located on the Right,Medial Malleolus . There was a Three Layer Compression Therapy Procedure by Zandra Abts, RN. Post  procedure Diagnosis Wound #12: Same as Pre-Procedure Plan Follow-up Appointments: Return Appointment in 1 week. Dressing Change Frequency: Do not change entire dressing for one week. Wound Cleansing: May shower with protection. - use cast protector Primary Wound Dressing: Wound #12 Right,Medial Malleolus: Polymem Silver Wound #9 Right,Medial Foot: Polymem Silver Secondary Dressing: Wound #12 Right,Medial Malleolus: Dry Gauze Wound #9 Right,Medial Foot: ABD pad Zetuvit or Kerramax Other: - felt callous pad Edema Control: 3 Layer Compression System - Right Lower Extremity Avoid standing for long periods of time Elevate legs to the level of the heart or above for 30 minutes daily and/or when sitting, a frequency of: - throughout the day Exercise regularly Off-Loading: Open toe surgical shoe to: - right foot 1. I am still using the polymen silver. 2. I see no evidence of infection. 3. We're putting her in 3 layer compression. She does not have to change the dressing this way. 4. I am not planning to retry the total contact cast Electronic Signature(s) Signed: 11/26/2019 3:11:24 PM By: Baltazar Najjar MD Entered By: Baltazar Najjar on 11/25/2019 15:03:55 -------------------------------------------------------------------------------- SuperBill Details Patient Name: Date of Service: [facility], Hedy J. 11/25/2019 Medical Record Number: [id number] Patient Account Number: [id number] Date of Birth/Sex: Treating RN: [birth year] (64 y.o. [name] Primary Care Provider: MA SO UDI, [facility] MA LSA  DA T Other Clinician: Referring Provider: Treating Provider/Extender: Baltazar Najjar MA SO UDI, ELHA MA LSA DA T Weeks in Treatment: 53 Diagnosis Coding ICD-10 Codes Code Description E11.621 Type 2 diabetes mellitus with foot ulcer L97.511 Non-pressure chronic ulcer of other part of right foot limited to breakdown of skin E11.42 Type 2 diabetes mellitus with diabetic  polyneuropathy M14.671 Charcot's joint, right ankle and foot Facility Procedures CPT4 Code: 16109604 Description: 11042 - DEB SUBQ TISSUE 20 SQ CM/< ICD-10 Diagnosis Description L97.511 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

## 2019-11-26 NOTE — Progress Notes (Signed)
Sarah, [name] ([id number]) Visit Report for 11/25/2019 Arrival Information Details Patient Name: Date of Service: Sarah, [name] 11/25/2019 1:45 PM Medical Record Number: [id number] Patient Account Number: [id number] Date of Birth/Sex: Treating RN: 07/16/1955 (64 y.o. [name] Primary Care Arhaan Chesnut: MA SO UDI, [place] MA LSA DA T Other Clinician: Referring Cashlynn Yearwood: Treating Ryshawn Sanzone/Extender: Baltazar Najjar MA SO UDI, ELHA MA LSA DA T Weeks in Treatment: 38 Visit Information History Since Last Visit Added or deleted any medications: No Patient Arrived: Crutches Any new allergies or adverse reactions: No Arrival Time: 13:50 Had a fall or experienced change in No Accompanied By: self activities of daily living that may affect Transfer Assistance: None risk of falls: Patient Identification Verified: Yes Signs or symptoms of abuse/neglect since No Secondary Verification Process Completed: Yes last visito Patient Requires Transmission-Based Precautions: No Hospitalized since last visit: No Patient Has Alerts: No Implantable device outside of the clinic No excluding cellular tissue based products placed in the center since last visit: Has Dressing in Place as Prescribed: Yes Has Compression in Place as Prescribed: Yes Has Footwear/Offloading in Place as Yes Prescribed: Right: Surgical Shoe with Pressure Relief Insole Pain Present Now: No Electronic Signature(s) Signed: 11/25/2019 4:55:08 PM By: Zenaida Deed RN, BSN Entered By: Zenaida Deed on 11/25/2019 13:50:50 -------------------------------------------------------------------------------- Compression Therapy Details Patient Name: Date of Service: Sarah Boss, [name] 11/25/2019 1:45 PM Medical Record Number: [id number] Patient Account Number: [id number] Date of Birth/Sex: Treating RN: 06-20-1955 (64 y.o. [name] Primary Care Zackeriah Kissler: MA SO UDI, Judene Companion MA LSA DA T Other Clinician: Referring  Vilda Zollner: Treating Rodolphe Edmonston/Extender: Baltazar Najjar MA SO UDI, ELHA MA LSA DA T Weeks in Treatment: 70 Compression Therapy Performed for Wound Assessment: Wound #12 Right,Medial Malleolus Performed By: Clinician Zandra Abts, RN Compression Type: Three Layer Post Procedure Diagnosis Same as Pre-procedure Electronic Signature(s) Signed: 11/25/2019 6:16:40 PM By: Zandra Abts RN, BSN Entered By: Zandra Abts on 11/25/2019 14:26:58 -------------------------------------------------------------------------------- Compression Therapy Details Patient Name: Date of Service: Sarah [name], [name] 11/25/2019 1:45 PM Medical Record Number: [id number] Patient Account Number: [id number] Date of Birth/Sex: Treating RN: 10/18/1955 (64 y.o. [name] Primary Care Reather Steller: MA SO UDI, Judene Companion MA LSA DA T Other Clinician: Referring Rainn Zupko: Treating Yohana Bartha/Extender: Baltazar Najjar MA SO UDI, ELHA MA LSA DA T Weeks in Treatment: 57 Compression Therapy Performed for Wound Assessment: Wound #9 Right,Medial Foot Performed By: Clinician Zandra Abts, RN Compression Type: Three Layer Post Procedure Diagnosis Same as Pre-procedure Electronic Signature(s) Signed: 11/25/2019 6:16:40 PM By: Zandra Abts RN, BSN Entered By: Zandra Abts on 11/25/2019 14:26:58 -------------------------------------------------------------------------------- Encounter Discharge Information Details Patient Name: Date of Service: Sarah CH, [name] 11/25/2019 1:45 PM Medical Record Number: [id number] Patient Account Number: [id number] Date of Birth/Sex: Treating RN: May 17, 1955 (64 y.o. [name] Primary Care Tregan Read: MA SO UDI, Judene Companion MA LSA DA T Other Clinician: Referring Requan Hardge: Treating Deondray Ospina/Extender: Baltazar Najjar MA SO UDI, ELHA MA LSA DA T Weeks in Treatment: 68 Encounter Discharge Information Items Post Procedure Vitals Discharge Condition: Stable Temperature (F):  98.4 Ambulatory Status: Crutches Pulse (bpm): 72 Discharge Destination: Home Respiratory Rate (breaths/min): 18 Transportation: Private Auto Blood Pressure (mmHg): 114/72 Accompanied By: self Schedule Follow-up Appointment: Yes Clinical Summary of Care: Electronic Signature(s) Signed: 11/25/2019 5:46:03 PM By: Shawn Stall Entered By: Shawn Stall on 11/25/2019 14:57:55 -------------------------------------------------------------------------------- Lower Extremity Assessment Details Patient Name: Date of Service: Sarah, [name] 11/25/2019 1:45 PM Medical Record Number: [id number] Patient Account Number: [id number] Date of Birth/Sex:  Treating RN: 1955/12/25 (64 y.o. [name] Primary Care Katrinka Herbison: MA SO UDI, [place] MA LSA DA T Other Clinician: Referring Chaun Uemura: Treating Sidhant Helderman/Extender: Baltazar Najjar MA SO UDI, ELHA MA LSA DA T Weeks in Treatment: 76 Edema Assessment Assessed: [Left: No] [Right: No] Edema: [Left: N] [Right: o] Calf Left: Right: Point of Measurement: 43 cm From Medial Instep 32.4 cm Ankle Left: Right: Point of Measurement: 12 cm From Medial Instep 22.2 cm Vascular Assessment Pulses: Dorsalis Pedis Palpable: [Right:Yes] Electronic Signature(s) Signed: 11/25/2019 4:55:08 PM By: Zenaida Deed RN, BSN Entered By: Zenaida Deed on 11/25/2019 14:00:02 -------------------------------------------------------------------------------- Multi Wound Chart Details Patient Name: Date of Service: Sarah Boss, [name] 11/25/2019 1:45 PM Medical Record Number: [id number] Patient Account Number: [id number] Date of Birth/Sex: Treating RN: [birth year] (64 y.o. [name] Primary Care Zoei Amison: MA SO UDI, ELHA MA LSA DA T Other Clinician: Referring Deivi Huckins: Treating Lavaughn Haberle/Extender: Baltazar Najjar MA SO UDI, ELHA MA LSA DA T Weeks in Treatment: 42 Vital Signs Height(in): 68 Capillary Blood Glucose(mg/dl): 578 Weight(lbs):  469 Pulse(bpm): 72 Body Mass Index(BMI): 40 Blood Pressure(mmHg): 114/72 Temperature(F): 98.4 Respiratory Rate(breaths/min): 18 Photos: [12:No Photos Right, Medial Malleolus] [9:No Photos Right, Medial Foot] [N/A:N/A N/A] Wound Location: [12:Trauma] [9:Blister] [N/A:N/A] Wounding Event: [12:Diabetic Wound/Ulcer of the Lower] [9:Diabetic Wound/Ulcer of the Lower] [N/A:N/A] Primary Etiology: [12:Extremity Glaucoma, Chronic sinus] [9:Extremity Glaucoma, Chronic sinus] [N/A:N/A] Comorbid History: [12:problems/congestion, Anemia, Sleep Apnea, Hypertension, Type II Diabetes, Rheumatoid Arthritis, Neuropathy 11/04/2019] [9:problems/congestion, Anemia, Sleep Apnea, Hypertension, Type II Diabetes, Rheumatoid Arthritis, Neuropathy  06/18/2018] [N/A:N/A] Date Acquired: [12:3] [9:43] [N/A:N/A] Weeks of Treatment: [12:Open] [9:Open] [N/A:N/A] Wound Status: [12:0.2x0.2x0.1] [9:1x1.4x0.4] [N/A:N/A] Measurements L x W x D (cm) [12:0.031] [9:1.1] [N/A:N/A] A (cm) : rea [12:0.003] [9:0.44] [N/A:N/A] Volume (cm) : [12:97.80%] [9:90.00%] [N/A:N/A] % Reduction in A rea: [12:97.90%] [9:95.60%] [N/A:N/A] % Reduction in Volume: [12:Grade 1] [9:Grade 2] [N/A:N/A] Classification: [12:Small] [9:Medium] [N/A:N/A] Exudate A mount: [12:Serous] [9:Serosanguineous] [N/A:N/A] Exudate Type: [12:amber] [9:red, brown] [N/A:N/A] Exudate Color: [12:Flat and Intact] [9:Thickened] [N/A:N/A] Wound Margin: [12:Small (1-33%)] [9:Medium (34-66%)] [N/A:N/A] Granulation A mount: [12:Pink] [9:Red, Pale] [N/A:N/A] Granulation Quality: [12:None Present (0%)] [9:Medium (34-66%)] [N/A:N/A] Necrotic A mount: [12:Fascia: No] [9:Fat Layer (Subcutaneous Tissue): Yes N/A] Exposed Structures: [12:Fat Layer (Subcutaneous Tissue): No Tendon: No Muscle: No Joint: No Bone: No Limited to Skin Breakdown Large (67-100%)] [9:Fascia: No Tendon: No Muscle: No Joint: No Bone: No None] [N/A:N/A] Epithelialization: [12:N/A] [9:Debridement -  Excisional] [N/A:N/A] Debridement: Pre-procedure Verification/Time Out N/A [9:14:19] [N/A:N/A] Taken: [12:N/A] [9:Callus, Subcutaneous] [N/A:N/A] Tissue Debrided: [12:N/A] [9:Skin/Subcutaneous Tissue] [N/A:N/A] Level: [12:N/A] [9:1.4] [N/A:N/A] Debridement A (sq cm): [12:rea N/A] [9:Curette] [N/A:N/A] Instrument: [12:N/A] [9:Moderate] [N/A:N/A] Bleeding: [12:N/A] [9:Silver Nitrate] [N/A:N/A] Hemostasis A chieved: [12:N/A] [9:0] [N/A:N/A] Procedural Pain: [12:N/A] [9:0] [N/A:N/A] Post Procedural Pain: [12:N/A] [9:Procedure was tolerated well] [N/A:N/A] Debridement Treatment Response: [12:N/A] [9:1x1.4x0.4] [N/A:N/A] Post Debridement Measurements L x W x D (cm) [12:N/A] [9:0.44] [N/A:N/A] Post Debridement Volume: (cm) [12:Compression Therapy] [9:Compression Therapy] [N/A:N/A] Procedures Performed: [9:Debridement] Treatment Notes Wound #12 (Right, Medial Malleolus) 1. Cleanse With Wound Cleanser Soap and water 2. Periwound Care Bag Balm 3. Primary Dressing Applied Polymem Ag 4. Secondary Dressing ABD Pad Dry Gauze Kerramax/Xtrasorb 6. Support Layer Applied 3 layer compression wrap 7. Footwear/Offloading device applied Surgical shoe Notes felt to medial foot wound. Wound #9 (Right, Medial Foot) 1. Cleanse With Wound Cleanser Soap and water 2. Periwound Care Bag Balm 3. Primary Dressing Applied Polymem Ag 4. Secondary Dressing ABD Pad Dry Gauze Kerramax/Xtrasorb 6. Support Layer Applied 3 layer compression wrap 7.  Footwear/Offloading device applied Surgical shoe Notes felt to medial foot wound. Electronic Signature(s) Signed: 11/25/2019 6:16:40 PM By: Zandra Abts RN, BSN Signed: 11/26/2019 3:11:24 PM By: Baltazar Najjar MD Entered By: Baltazar Najjar on 11/25/2019 14:59:17 -------------------------------------------------------------------------------- Multi-Disciplinary Care Plan Details Patient Name: Date of Service: [facility], Kensy J. 11/25/2019 1:45  PM Medical Record Number: [id number] Patient Account Number: [id number] Date of Birth/Sex: Treating RN: Oct 19, 1955 (64 y.o. [name] Primary Care Jhonnie Aliano: MA SO UDI, Judene Companion MA LSA DA T Other Clinician: Referring Hurschel Paynter: Treating Chozen Latulippe/Extender: Baltazar Najjar MA SO UDI, ELHA MA LSA DA T Weeks in Treatment: 68 Active Inactive Wound/Skin Impairment Nursing Diagnoses: Impaired tissue integrity Knowledge deficit related to ulceration/compromised skin integrity Goals: Patient/caregiver will verbalize understanding of skin care regimen Date Initiated: 01/28/2019 Target Resolution Date: 12/27/2019 Goal Status: Active Interventions: Assess patient/caregiver ability to obtain necessary supplies Assess patient/caregiver ability to perform ulcer/skin care regimen upon admission and as needed Assess ulceration(s) every visit Provide education on ulcer and skin care Notes: Electronic Signature(s) Signed: 11/25/2019 6:16:40 PM By: Zandra Abts RN, BSN Entered By: Zandra Abts on 11/25/2019 18:06:04 -------------------------------------------------------------------------------- Pain Assessment Details Patient Name: Date of Service: Sarah [name], Ruqayya J. 11/25/2019 1:45 PM Medical Record Number: [id number] Patient Account Number: [id number] Date of Birth/Sex: Treating RN: December 04, 1955 (64 y.o. [name] Primary Care Zierra Laroque: MA SO UDI, [place] MA LSA DA T Other Clinician: Referring Lynnet Hefley: Treating Lillis Nuttle/Extender: Baltazar Najjar MA SO UDI, ELHA MA LSA DA T Weeks in Treatment: 16 Active Problems Location of Pain Severity and Description of Pain Patient Has Paino No Site Locations Rate the pain. Rate the pain. Current Pain Level: 0 Pain Management and Medication Current Pain Management: Electronic Signature(s) Signed: 11/25/2019 4:55:08 PM By: Zenaida Deed RN, BSN Entered By: Zenaida Deed on 11/25/2019  13:54:14 -------------------------------------------------------------------------------- Patient/Caregiver Education Details Patient Name: Date of Service: Gypsy Decant 11/8/2021andnbsp1:45 PM Medical Record Number: [id number] Patient Account Number: [id number] Date of Birth/Gender: Treating RN: September 22, 1955 (64 y.o. [name] Primary Care Physician: MA SO UDI, Judene Companion MA LSA DA T Other Clinician: Referring Physician: Treating Physician/Extender: Baltazar Najjar MA SO UDI, ELHA MA LSA DA T Weeks in Treatment: 88 Education Assessment Education Provided To: Patient Education Topics Provided Wound/Skin Impairment: Methods: Explain/Verbal Responses: State content correctly Electronic Signature(s) Signed: 11/25/2019 6:16:40 PM By: Zandra Abts RN, BSN Entered By: Zandra Abts on 11/25/2019 18:06:14 -------------------------------------------------------------------------------- Wound Assessment Details Patient Name: Date of Service: Sarah CH, Nikisha J. 11/25/2019 1:45 PM Medical Record Number: [id number] Patient Account Number: [id number] Date of Birth/Sex: Treating RN: 1955/07/11 (64 y.o. [name] Primary Care Armanii Urbanik: MA SO UDI, [place] MA LSA DA T Other Clinician: Referring Jailani Hogans: Treating Bayley Yarborough/Extender: Baltazar Najjar MA SO UDI, ELHA MA LSA DA T Weeks in Treatment: 18 Wound Status Wound Number: 12 Primary Diabetic Wound/Ulcer of the Lower Extremity Etiology: Wound Location: Right, Medial Malleolus Wound Open Wounding Event: Trauma Status: Date Acquired: 11/04/2019 Comorbid Glaucoma, Chronic sinus problems/congestion, Anemia, Sleep Weeks Of Treatment: 3 History: Apnea, Hypertension, Type II Diabetes, Rheumatoid Arthritis, Clustered Wound: No Neuropathy Photos Photo Uploaded By: Benjaman Kindler on 11/26/2019 07:16:58 Wound Measurements Length: (cm) 0.2 Width: (cm) 0.2 Depth: (cm) 0.1 Area: (cm) 0.031 Volume: (cm) 0.003 % Reduction  in Area: 97.8% % Reduction in Volume: 97.9% Epithelialization: Large (67-100%) Tunneling: No Undermining: No Wound Description Classification: Grade 1 Wound Margin: Flat and Intact Exudate Amount: Small Exudate Type: Serous Exudate Color: amber Foul Odor After Cleansing: No Slough/Fibrino Yes Wound Bed Granulation Amount: Small (  1-33%) Exposed Structure Granulation Quality: Pink Fascia Exposed: No Necrotic Amount: None Present (0%) Fat Layer (Subcutaneous Tissue) Exposed: No Tendon Exposed: No Muscle Exposed: No Joint Exposed: No Bone Exposed: No Limited to Skin Breakdown Treatment Notes Wound #12 (Right, Medial Malleolus) 1. Cleanse With Wound Cleanser Soap and water 2. Periwound Care Bag Balm 3. Primary Dressing Applied Polymem Ag 4. Secondary Dressing ABD Pad Dry Gauze Kerramax/Xtrasorb 6. Support Layer Applied 3 layer compression wrap 7. Footwear/Offloading device applied Surgical shoe Notes felt to medial foot wound. Electronic Signature(s) Signed: 11/25/2019 4:55:08 PM By: Zenaida Deed RN, BSN Entered By: Zenaida Deed on 11/25/2019 14:03:41 -------------------------------------------------------------------------------- Wound Assessment Details Patient Name: Date of Service: Sarah CH, Stepanie J. 11/25/2019 1:45 PM Medical Record Number: [id number] Patient Account Number: [id number] Date of Birth/Sex: Treating RN: 09/14/1955 (64 y.o. [name] Primary Care Aleck Locklin: MA SO UDI, [place] MA LSA DA T Other Clinician: Referring Shanieka Blea: Treating Verlene Glantz/Extender: Baltazar Najjar MA SO UDI, ELHA MA LSA DA T Weeks in Treatment: 34 Wound Status Wound Number: 9 Primary Diabetic Wound/Ulcer of the Lower Extremity Etiology: Wound Location: Right, Medial Foot Wound Open Wounding Event: Blister Status: Date Acquired: 06/18/2018 Comorbid Glaucoma, Chronic sinus problems/congestion, Anemia, Sleep Weeks Of Treatment: 43 History: Apnea, Hypertension,  Type II Diabetes, Rheumatoid Arthritis, Clustered Wound: No Neuropathy Photos Photo Uploaded By: Benjaman Kindler on 11/26/2019 07:16:58 Wound Measurements Length: (cm) 1 Width: (cm) 1.4 Depth: (cm) 0.4 Area: (cm) 1.1 Volume: (cm) 0.44 % Reduction in Area: 90% % Reduction in Volume: 95.6% Epithelialization: None Tunneling: No Undermining: No Wound Description Classification: Grade 2 Wound Margin: Thickened Exudate Amount: Medium Exudate Type: Serosanguineous Exudate Color: red, brown Foul Odor After Cleansing: No Slough/Fibrino No Wound Bed Granulation Amount: Medium (34-66%) Exposed Structure Granulation Quality: Red, Pale Fascia Exposed: No Necrotic Amount: Medium (34-66%) Fat Layer (Subcutaneous Tissue) Exposed: Yes Necrotic Quality: Adherent Slough Tendon Exposed: No Muscle Exposed: No Joint Exposed: No Bone Exposed: No Treatment Notes Wound #9 (Right, Medial Foot) 1. Cleanse With Wound Cleanser Soap and water 2. Periwound Care Bag Balm 3. Primary Dressing Applied Polymem Ag 4. Secondary Dressing ABD Pad Dry Gauze Kerramax/Xtrasorb 6. Support Layer Applied 3 layer compression wrap 7. Footwear/Offloading device applied Surgical shoe Notes felt to medial foot wound. Electronic Signature(s) Signed: 11/25/2019 4:55:08 PM By: Zenaida Deed RN, BSN Entered By: Zenaida Deed on 11/25/2019 14:04:13 -------------------------------------------------------------------------------- Vitals Details Patient Name: Date of Service: Sarah CH, Kinzleigh J. 11/25/2019 1:45 PM Medical Record Number: [id number] Patient Account Number: [id number] Date of Birth/Sex: Treating RN: 27-Jul-1955 (64 y.o. [name] Primary Care Roney Youtz: MA SO UDI, [place] MA LSA DA T Other Clinician: Referring Karalynn Cottone: Treating Kayline Sheer/Extender: Baltazar Najjar MA SO UDI, ELHA MA LSA DA T Weeks in Treatment: 13 Vital Signs Time Taken: 13:53 Temperature (F): 98.4 Height (in):  68 Pulse (bpm): 72 Source: Stated Respiratory Rate (breaths/min): 18 Weight (lbs): 265 Blood Pressure (mmHg): 114/72 Source: Stated Capillary Blood Glucose (mg/dl): 670 Body Mass Index (BMI): 40.3 Reference Range: 80 - 120 mg / dl Electronic Signature(s) Signed: 11/25/2019 4:55:08 PM By: Zenaida Deed RN, BSN Entered By: Zenaida Deed on 11/25/2019 13:53:28

## 2019-12-02 ENCOUNTER — Encounter (HOSPITAL_BASED_OUTPATIENT_CLINIC_OR_DEPARTMENT_OTHER): Payer: Medicare Other | Admitting: Internal Medicine

## 2019-12-02 ENCOUNTER — Other Ambulatory Visit: Payer: Self-pay

## 2019-12-02 DIAGNOSIS — E11621 Type 2 diabetes mellitus with foot ulcer: Secondary | ICD-10-CM | POA: Diagnosis not present

## 2019-12-03 NOTE — Progress Notes (Signed)
[name], [name] ([id number]) Visit Report for 12/02/2019 HPI Details Patient Name: Date of Service: [name], [name] 12/02/2019 1:45 PM Medical Record Number: [id number] Patient Account Number: [id number] Date of Birth/Sex: Treating RN: 1955-11-18 (64 y.o. [name] Primary Care Provider: MA SO [name], [name] Other Clinician: Referring Provider: Treating Provider/Extender: [name] [name] Weeks in Treatment: 13 History of Present Illness HPI Description: 64 yrs old bf here for follow up recurrent left charcot foot ulcer. She has DM. She is not a smoker. She states a blister developed 3 months ago at site of previous breakdown from 1 year ago. It was debrided at the podiatrist's office. She went to the ER and then sent here. She denies pain. Xray was negative for osteo. Culture from this clinic negative. 09/08/14 [address] Kelly [name] to Dr. Victorino [name] for Charcot foot. Seen by him and MRI scheduled 09/19/14. Prior silver collagen but this was not ordered last visit, patient has been rinsing with saline alone. Re institute silver collagen every other day. F/u 2 weeks with Dr. Leanord [name] as Labor Day holiday. Supplies ordered. 09/25/14; this is a patient with a Charcot foot on the left. she has a wound over the plantar aspect of her left calcaneus. She has been seen by Dr. Victorino [name] of orthopedic surgery and has had an MRI. She is apparently going within the next week or 2 for corrective surgery which will involve an external fixator. I don'[name] have any of the details of this. 10/06/14; The patient is a 64 yrs old bf with a left Charcot foot and ulcer on the plantar. 12/01/14 Returns post surgery with Dr. Victorino [name]. Has follow up visit later this week with him, currently in facility and NWB over this extremity. States no drainage from foot and no current wound care. On exam callus and scab in place, suspect healed. Has external fixator in place. Will defer to Dr.  Victorino [name] for management, ok to place moisturizing cream over scabs and callus and she will call if she requires follow up here. READMISSION 01/28/2019 Patient was in the clinic in 2016 for a wound on the plantar aspect of her left calcaneus. Predominantly cared for by Dr. Ulice [name] she was referred to Dr. Victorino [name] and had corrective surgery for the position of her left ankle I believe. She had previously been here in 2015 and then prior to that in 2011 2012 that I do not have these records. More recently she has developed fairly extensive wounds on the right medial foot, left lateral foot and a small area on the right medial great toe. Right medial foot wound has been there for 6 to 7 months, left foot wound for 2 to 3 months and the right great toe only over the last few weeks. She has been followed by [name] at [name] office it sounds as though she has been undergoing callus paring and silver nitrate. The patient has been washing these off with soap and water and plan applying dry dressing Past medical history, type 2 diabetes well controlled with a recent hemoglobin A1c of 7 on 11/16, type 2 diabetes with peripheral neuropathy, previous left Charcot joint surgery bilateral Charcot joints currently, stage III chronic renal failure, hypertension, obstructive sleep apnea, history of MRSA and gastroesophageal reflux. 1/18; this is a patient with bilateral Charcot feet. Plain x-rays that I did of both of these wounds that are either at bone or precariously close to bone  were done. On the right foot this showed possible lytic destruction involving the anterior aspect of the talus which may represent a wound osteomyelitis. An MRI was recommended. On the left there was no definite lytic destruction although the wound is deep undermining's and I think probably requires an MRI on this basis in any case 1/25; patient was supposed to have her MRI last Friday of her bilateral feet however they would  not do it because there was silver alginate in her dressings, will replace with calcium alginate today and will see if we can get her done today 2/1; patient did not get her MRIs done last week because of issues with the MRI department receiving orders to do bilateral feet. She has 2 deep wounds in the setting of Charcot feet deformities bilaterally. Both of them at one point had exposed bone although I had trouble demonstrating that today. I am not sure that we can offload these very aggressively as I watched her leave the clinic last week her gait is very narrow and unsteady. 2/15; the MRI of her right foot did not show osteomyelitis potentially osteonecrosis of the talus. 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 The wounds are a large wound on the right medial foot and on the left a substantial wound on the left lateral plantar calcaneus. We have been using silver alginate. Ultimately I think we need to consider a total contact cast on the right while we work through the possibility of osteomyelitis in the left. I watched her walk out with her crutches I have some trepidation about the ability to walk with the cast on her right foot. Nevertheless with her Charcot deformities I do not think there is a way to heal these short of offloading them aggressively. 2/22; the culture of the bone in the left foot that I did last week showed a few Citrobacter Koseri as well as some streptococcal species. In my attempted bone for pathology did not actually yield a lot of bone the pathologist did not identify any osteomyelitis. 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 Is promised today and aggressive debridement of the right foot and a total contact cast which surprisingly she seemed to tolerate quite well 03/13/2019  upon evaluation today patient appears to be doing well with her total contact cast she is here for the first cast change. She had no areas of rubbing or any other complications at this point. Overall things are doing quite well. 2/26; the patient was kindly seen by Dr. Megan Salon of infectious disease on 2/25. She is now on IV vancomycin PICC line placement in 2 days and oral Flagyl. This was in response to her MRI showing osteomyelitis of the left calcaneus concerning for osteomyelitis. We have been putting her right leg in a total contact cast. Our nurses report drainage. She is tolerating the total contact cast well. 3/4; the patient is started her IV antibiotics and is taking IV vancomycin and oral Flagyl. She appears to be tolerating these both well. This is for osteomyelitis involving the left calcaneus. The area on the right in the setting of bilateral Charcot deformities did not have any bone infection on MRI. We put a total contact cast on her with silver alginate as the primary dressing and she returns today in follow-up. Per  our intake nurse there was too much drainage to leave this on all week therefore we will bring her back for an early change 3/8; follow-up total contact cast she is still having a lot of drainage probably too much drainage from the right foot wound will week with the same cast. She will be back on Thursday. The area on the left looks somewhat better 3/11; patient here for a total contact cast change. 3/15; patient came in for wound care evaluation. She is still on IV vancomycin and oral Flagyl for osteomyelitis in the left foot. The area on the right foot we are putting in a total contact cast. 3/18 for total contact cast change on the right foot 3/22; the patient was here for wound review today. The area on the right foot is slightly smaller in terms of surface area. However the surface of the wound is very gritty and hyper granulated. More problematically the area on  the plantar left foot has increased depth indeed in the [name] of this it probes to bone. We have been using Hydrofera Blue in both areas. She is on antibiotics as prescribed by infectious disease IV vancomycin and oral Flagyl 3/25; the patient is in for total contact cast change. Our intake nurse was concerned about the amount of drainage which soaked right to the multiple layers we are putting on this. Wondered about options. The wound bed actually looks as healthy as this is looked although there may not be a lot of change in dimensions things are looking a lot better. If we are going to heal this woman's wound which is in the middle of her right Charcot foot this is going to need to be offloaded. There are not many alternatives 3/29; still not a lot of improvement although the surface of the right wound looks better. We have been using Hydrofera Blue 04/17/2019 upon evaluation today patient appears to be doing well with a total contact cast. With actually having to change this twice a week. She saw Dr. Dellia Nims for wound care earlier in the week this is for the cast change today. That is due to the fact that she has significant drainage. Overall the wound is been looking excellent however. 4/5; we continue to put a total contact cast on this patient with Hydrofera Blue. Still a lot of drainage which precludes a simple weekly change or changing this twice a week. 4/8; total contact cast changed today. Apparently the wound looks better per our intake nurse 4/12; patient here for wound care evaluation. Wounds on the right foot have not changed in dimension none about a month left foot looks similar. Apparently her antibiotics that we are giving her for osteomyelitis in the left foot complete on Wednesday. We have been using Hydrofera Blue on the right foot under a total contact cast. Nurses report greenish drainage although I think this may be discoloration from the [facility] 4/15; back for cast  change wounds look quite good although still moderate amount of drainage. [name] contact cast reapplied otal 4/19;. Wound evaluation. Wound on the right foot is smaller surface looks healthy. There is still the divot in the middle part of this wound but I could not feel any bone. Area on the left foot also looks better She has completed her antibiotics but still has the PICC line in. 4/23; patient comes back for a total contact cast change this was done in the standard fashion no issues 4/26; wound measures slightly larger on the right  foot which is disappointing. She sees Dr. Megan Salon with regards to her antibiotics on Wednesday. I believe she is on vancomycin IV and oral Flagyl. 4/29; in for her routine total contact cast change. She saw Dr. Megan Salon this morning. He is asking about antibiotic continuation I will be in touch with him. I did not look at her wound today 5/3; patient saw Dr. Megan Salon on 4/28. I did send him a note asking about the continuation of perhaps an oral regimen. I have not heard back. She still has an elevated sedimentation rate at 81. Her wounds are smaller. 5/6; in for her total contact cast change. We did not look at the wound today. She has seen Dr. Megan Salon and is now on oral Keflex and Flagyl. She seems to be tolerating these well 5/10; she had her PICC line removed. The wound on the right foot after some initial improvement has not made any improvement even with the total contact cast. Still having a lot of drainage. Likewise the area on the left foot really is not any better either 5/14; in for a cast change today. We will look at the right foot on Monday. If this is not making any progress I am going to try to cast the left foot. 5/17; the wound on the right foot is absolutely no different. I am going to put ointment on this area and change the cast of the left foot. We have been using silver alginate there 5/21; apparently the area on the right foot was better per our  intake nurse although I did not see it. We did a standard total contact cast change on the left foot we have been applying silver alginate to the wound here. 06/10/19-Area on the right foot per dimensions slightly better, the left foot has still some depth applying silver alginate to the wound with TCC 06/13/19-Patient here for Casting to Left foot 6/1; patient had the total contact cast were placed on the left foot. Noted some swelling in her right leg although the dimensions of the wound on the right foot are better. We have been using PolyMem on the right and silver alginate on the left under the cast 6/7; we continue to make nice progress in both her plantar feet wound. We are putting the total contact cast on the left. Surprisingly in spite of this the area on the right medial foot in the middle of her Charcot deformities actually is making progress as well 07/01/2019 upon evaluation today patient actually appears to be doing quite well with regard to her wounds on the feet compared to when I saw her last that has been quite some time. With that being said I do believe the total contact cast has been beneficial in this left foot and she has a lot of signs of improvement she does have some callus overhanging which is caused a little bit of a ledge and an issue here. I do believe that we may be able to do something to help in that regard. Fortunately otherwise the wound seems to be progressing quite nicely. 6/21; the patient's area on the left foot that we are currently casting is just about closed. The area on the right medial foot has a healthy looking surface but may be not changed as much from last week in terms of surface area. We are using silver alginate on the right and polymen Ag under the cast 6/28; only a small open area on the left foot remains. Using silver alginate  under a total contact cast. She has a diabetic shoe I have asked her to bring that for next week where this may be closed.  The area on the right foot with a Charcot deformity is also measuring slightly smaller. We are using PolyMem Ag here 7/6; left foot is totally closed. She does not require a total contact cast. She will graduate to her own diabetic shoe. The area on the right foot with a Charcot deformity measuring about the same. We have been using polymen under compression wraps. She did not do well in this area with a total contact cast 7/13; left foot remains intact. She is skillfully covering this area with foam and using her diabetic shoe. Slight area improvement of the right foot wound. Still rolled senescent looking edges. We have been using polymen 7/19; left foot remains intact. Wound measuring slightly smaller on the right foot in the middle of the Charcot deformity. We have been using PolyMem Ag 7/26; per the patient left foot remains intact. The wound on the right foot looks slightly smaller but dimensions are measuring the same. We have been using polymen Ag 8/9-Patient back after 2 weeks, wound of the right foot again looking slightly smaller, continuing to use PolyMem silver 8/16; patient has made some minor improvements in wound area on the right foot. Using polymen Ag. The left foot remains healed 8/23; continued slight improvement in dimensions. Rolled edges around the wound edge noted. We have been using polymen Ag. Patient offloading with a surgical shoe and crutches 8/30; no change in surface area. Thick rolled skin and subcutaneous tissue around the edges debris on the wound surface. We have been using polymen Ag initially with some improvement however stalled on the last 2 weeks in terms of surface area 9/13; surface area is smaller still using polymen Ag. Thick rolled skin and subcutaneous tissue around the edges requiring debridement. 9/20; again surface area is improved using polymen Ag however each week she develops thick rolled skin and subcutaneous tissue around the wound  edges requiring debridement. She is offloading this using crutches 9/27; we've been using PolyMem AG. Wound does not look any different from last week. Raised edges of callus skin and subcutaneous tissue around the wound bed. 10/5; we been using polymen Ag. No ability to put a total contact cast on today in the clinic. Wound is about the same still rolled edges of thick callus around the margins 10/18; we put her in a total contact cast last week things did not go well. Not only does the wound looked worse macerated angry with thick callus and some warmth around it. She had another wound above this and one on the anterior right leg. I am not totally certain why this happened. She has not been systemically unwell 10/25; culture from last week grew Pseudomonas and MRSA. My experience with this MRSA is usually a true infection and Pseudomonas a colonizer I am going to put her on doxycycline today we put gentamicin on the wound surface. Continuing with silver alginate 11/1; she has tolerated her antibiotics. I gave her doxycycline. Was still using gentamicin. The original wound looks about the same thick rolled edges. The cast injury just above it is superficial 11/8; even after an aggressive debridement last week the wound on her medial Charcot foot on the right looks about the same. The surface looks satisfactory however rolled edges of callus thick skin and subcutaneous tissue. She did not do well on 2 separate occasions in a  total contact cast. The cast abrasion she has on the medial foot is just above closed. We have been using polymen Ag. She is attempting to offload this is much as she can with her crutches 11/15; the wound on the Charcot right foot about the same in terms of surface area.. The area above this on the ankle is healed. Electronic Signature(s) Signed: 12/02/2019 5:39:18 PM By: Sarah Najjar MD Entered By: [name] on 12/02/2019  15:28:25 -------------------------------------------------------------------------------- Physical Exam Details Patient Name: Date of Service: [name], [name] 12/02/2019 1:45 PM Medical Record Number: [id number] Patient Account Number: [id number] Date of Birth/Sex: Treating RN: [birth year] (64 y.o. [name] Primary Care Provider: MA SO [name], [place] Sarah LSA DA [name] Other Clinician: Referring Provider: Treating Provider/Extender: [name] [name] Weeks in Treatment: 38 Constitutional Sitting or standing Blood Pressure is within target range for patient.. Pulse regular and within target range for patient.Marland Kitchen Respirations regular, non-labored and within target range.. Temperature is normal and within the target range for the patient.Marland Kitchen Appears in no distress. Cardiovascular Pedal pulses are palpable. Notes Wound exam; right medial plantar foot. Thick rolled edges of callus and subcutaneous tissue around the wound margin. The wound bed itself looks very healthy. Healthy granulation. I did not debride this today I did this last week its right back. The cast injury just above this has closed Electronic Signature(s) Signed: 12/02/2019 5:39:18 PM By: Sarah Najjar MD Entered By: [name] on 12/02/2019 15:29:28 -------------------------------------------------------------------------------- Physician Orders Details Patient Name: Date of Service: [name], [name], [name] 12/02/2019 1:45 PM Medical Record Number: [id number] Patient Account Number: [id number] Date of Birth/Sex: Treating RN: 1955/04/04 (65 y.o. [name] Primary Care Provider: MA SO [name], [place] Sarah LSA DA [name] Other Clinician: Referring Provider: Treating Provider/Extender: [name] [name] Weeks in Treatment: 44 Verbal / Phone Orders: No Diagnosis Coding ICD-10 Coding Code Description E11.621 Type 2 diabetes mellitus with foot ulcer L97.511 Non-pressure chronic  ulcer of other part of right foot limited to breakdown of skin E11.42 Type 2 diabetes mellitus with diabetic polyneuropathy M14.671 Charcot's joint, right ankle and foot Follow-up Appointments Return Appointment in 2 weeks. Nurse Visit: - Tuesday 11/23 Dressing Change Frequency Do not change entire dressing for one week. Wound Cleansing May shower with protection. - use cast protector Primary Wound Dressing Wound #9 Right,Medial Foot Polymem Silver Secondary Dressing Wound #9 Right,Medial Foot ABD pad Zetuvit or Kerramax Other: - felt callous pad Edema Control 3 Layer Compression System - Right Lower Extremity Avoid standing for long periods of time Elevate legs to the level of the heart or above for 30 minutes daily and/or when sitting, a frequency of: - throughout the day Exercise regularly Off-Loading Open toe surgical shoe to: - right foot Electronic Signature(s) Signed: 12/02/2019 5:39:18 PM By: Sarah Najjar MD Signed: 12/02/2019 5:51:53 PM By: Zandra Abts RN, BSN Entered By: Zandra Abts on 12/02/2019 14:36:16 -------------------------------------------------------------------------------- Problem List Details Patient Name: Date of Service: [name], [name] 12/02/2019 1:45 PM Medical Record Number: [id number] Patient Account Number: [id number] Date of Birth/Sex: Treating RN: 1955/12/20 (64 y.o. [name] Primary Care Provider: MA Hedy Jacob Sarah LSA DA [name] Other Clinician: Referring Provider: Treating Provider/Extender: Abe People, ELHA Sarah LSA DA [name] Weeks in Treatment: 76 Active Problems ICD-10 Encounter Code Description Active Date MDM Diagnosis E11.621 Type 2 diabetes mellitus with foot ulcer 01/28/2019 No Yes L97.511 Non-pressure  chronic ulcer of other part of right foot limited to breakdown of 01/28/2019 No Yes skin E11.42 Type 2 diabetes mellitus with diabetic polyneuropathy 01/28/2019 No Yes M14.671 Charcot's joint, right  ankle and foot 01/28/2019 No Yes Inactive Problems ICD-10 Code Description Active Date Inactive Date L97.524 Non-pressure chronic ulcer of other part of left foot with necrosis of bone 01/28/2019 01/28/2019 L97.514 Non-pressure chronic ulcer of other part of right foot with necrosis of bone 01/28/2019 01/28/2019 E45.409 Charcot's joint, left ankle and foot 01/28/2019 01/28/2019 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 12/02/2019 1:45 PM Medical Record Number: [id number] Patient Account Number: [id number] Date of Birth/Sex: Treating RN: [birth year] (64 y.o. [name] Primary Care Provider: MA SO [name], [place] Sarah LSA DA [name] Other Clinician: Referring Provider: Treating Provider/Extender: [name] [name] Weeks in Treatment: 44 Subjective History of Present Illness (HPI) 64 yrs old bf here for follow up recurrent left charcot foot ulcer. She has DM. She is not a smoker. She states a blister developed 3 months ago at site of previous breakdown from 1 year ago. It was debrided at the podiatrist's office. She went to the ER and then sent here. She denies pain. Xray was negative for osteo. Culture from this clinic negative. 09/08/[address] Kelly [name] to Dr. Victorino [name] for Charcot foot. Seen by him and MRI scheduled 09/19/14. Prior silver collagen but this was not ordered last visit, patient has been rinsing with saline alone. Re institute silver collagen every other day. F/u 2 weeks with Dr. Leanord [name] as Labor Day holiday. Supplies ordered. 09/25/14; this is a patient with a Charcot foot on the left. she has a wound over the plantar aspect of her  left calcaneus. She has been seen by Dr. Victorino [name] of orthopedic surgery and has had an MRI. She is apparently going within the next week or 2 for corrective surgery which will involve an external fixator. I don'[name] have any of the details of this. 10/06/14; The patient is a 64 yrs old bf with a left Charcot foot and ulcer on the plantar. 12/01/14 Returns post surgery with Dr. Victorino [name]. Has follow up visit later this week with him, currently in facility and NWB over this extremity. States no drainage from foot and no current wound care. On exam callus and scab in place, suspect healed. Has external fixator in place. Will defer to Dr. Victorino [name] for management, ok to place moisturizing cream over scabs and callus and she will call if she requires follow up here. READMISSION 01/28/2019 Patient was in the clinic in 2016 for a wound on the plantar aspect of her left calcaneus. Predominantly cared for by Dr. Ulice [name] she was referred to Dr. Victorino [name] and had corrective surgery for the position of her left ankle I believe. She had previously been here in 2015 and then prior to that in 2011 2012 that I do not have these records. More recently she has developed fairly extensive wounds on the right medial foot, left lateral foot and a small area on the right medial great toe. Right medial foot wound has been there for 6 to 7 months, left foot wound for 2 to 3 months and the right great toe only over the last few weeks. She has been followed by  [name] at [name] it sounds as though she has been undergoing callus paring and silver nitrate. The patient has been washing these off with soap and water and plan applying dry dressing Past medical history, type 2 diabetes well controlled with a recent hemoglobin A1c of 7 on 11/16, type 2 diabetes with peripheral neuropathy, previous left Charcot joint surgery bilateral Charcot joints currently, stage III chronic renal failure, hypertension, obstructive sleep  apnea, history of MRSA and gastroesophageal reflux. 1/18; this is a patient with bilateral Charcot feet. Plain x-rays that I did of both of these wounds that are either at bone or precariously close to bone were done. On the right foot this showed possible lytic destruction involving the anterior aspect of the talus which may represent a wound osteomyelitis. An MRI was recommended. On the left there was no definite lytic destruction although the wound is deep undermining's and I think probably requires an MRI on this basis in any case 1/25; patient was supposed to have her MRI last Friday of her bilateral feet however they would not do it because there was silver alginate in her dressings, will replace with calcium alginate today and will see if we can get her done today 2/1; patient did not get her MRIs done last week because of issues with the MRI department receiving orders to do bilateral feet. She has 2 deep wounds in the setting of Charcot feet deformities bilaterally. Both of them at one point had exposed bone although I had trouble demonstrating that today. I am not sure that we can offload these very aggressively as I watched her leave the clinic last week her gait is very narrow and unsteady. 2/15; the MRI of her right foot did not show osteomyelitis potentially osteonecrosis of the talus. 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 The wounds are a large wound on the right medial foot and on the left a substantial wound on the left lateral plantar calcaneus. We have been using silver alginate. Ultimately I think we need to consider a total contact cast on the right while we work through the possibility of osteomyelitis in the left. I watched her walk out with her crutches I have some trepidation about the ability to walk with the cast on her right foot. Nevertheless with her Charcot deformities I do not think there is a way to heal these short  of offloading them aggressively. 2/22; the culture of the bone in the left foot that I did last week showed a few Citrobacter Koseri as well as some streptococcal species. In my attempted bone for pathology did not actually yield a lot of bone the pathologist did not identify any osteomyelitis. 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 Is promised today and aggressive debridement of the right foot and a total contact cast which surprisingly she seemed to tolerate quite well 03/13/2019 upon evaluation today patient appears to be doing well with her total contact cast she is here for the first cast change. She had no areas of rubbing or any other complications at this point. Overall things are doing quite well. 2/26; the patient was kindly seen by Dr. Orvan Falconer of infectious disease on 2/25. She is now on IV vancomycin PICC line placement in 2 days and oral Flagyl. This was in  response to her MRI showing osteomyelitis of the left calcaneus concerning for osteomyelitis. We have been putting her right leg in a total contact cast. Our nurses report drainage. She is tolerating the total contact cast well. 3/4; the patient is started her IV antibiotics and is taking IV vancomycin and oral Flagyl. She appears to be tolerating these both well. This is for osteomyelitis involving the left calcaneus. The area on the right in the setting of bilateral Charcot deformities did not have any bone infection on MRI. We put a total contact cast on her with silver alginate as the primary dressing and she returns today in follow-up. Per our intake nurse there was too much drainage to leave this on all week therefore we will bring her back for an early change 3/8; follow-up total contact cast she is still having a lot of drainage probably too much drainage from the right foot  wound will week with the same cast. She will be back on Thursday. The area on the left looks somewhat better 3/11; patient here for a total contact cast change. 3/15; patient came in for wound care evaluation. She is still on IV vancomycin and oral Flagyl for osteomyelitis in the left foot. The area on the right foot we are putting in a total contact cast. 3/18 for total contact cast change on the right foot 3/22; the patient was here for wound review today. The area on the right foot is slightly smaller in terms of surface area. However the surface of the wound is very gritty and hyper granulated. More problematically the area on the plantar left foot has increased depth indeed in the [name] of this it probes to bone. We have been using Hydrofera Blue in both areas. She is on antibiotics as prescribed by infectious disease IV vancomycin and oral Flagyl 3/25; the patient is in for total contact cast change. Our intake nurse was concerned about the amount of drainage which soaked right to the multiple layers we are putting on this. Wondered about options. The wound bed actually looks as healthy as this is looked although there may not be a lot of change in dimensions things are looking a lot better. If we are going to heal this woman's wound which is in the middle of her right Charcot foot this is going to need to be offloaded. There are not many alternatives 3/29; still not a lot of improvement although the surface of the right wound looks better. We have been using Hydrofera Blue 04/17/2019 upon evaluation today patient appears to be doing well with a total contact cast. With actually having to change this twice a week. She saw Dr. Leanord [name] for wound care earlier in the week this is for the cast change today. That is due to the fact that she has significant drainage. Overall the wound is been looking excellent however. 4/5; we continue to put a total contact cast on this patient with Hydrofera Blue.  Still a lot of drainage which precludes a simple weekly change or changing this twice a week. 4/8; total contact cast changed today. Apparently the wound looks better per our intake nurse 4/12; patient here for wound care evaluation. Wounds on the right foot have not changed in dimension none about a month left foot looks similar. Apparently her antibiotics that we are giving her for osteomyelitis in the left foot complete on Wednesday. We have been using Hydrofera Blue on the right foot under a total contact cast. Nurses report greenish  drainage although I think this may be discoloration from the [name] 4/15; back for cast change wounds look quite good although still moderate amount of drainage. [name] contact cast reapplied otal 4/19;. Wound evaluation. Wound on the right foot is smaller surface looks healthy. There is still the divot in the middle part of this wound but I could not feel any bone. Area on the left foot also looks better She has completed her antibiotics but still has the PICC line in. 4/23; patient comes back for a total contact cast change this was done in the standard fashion no issues 4/26; wound measures slightly larger on the right foot which is disappointing. She sees Dr. Orvan Falconer with regards to her antibiotics on Wednesday. I believe she is on vancomycin IV and oral Flagyl. 4/29; in for her routine total contact cast change. She saw Dr. Orvan Falconer this morning. He is asking about antibiotic continuation I will be in touch with him. I did not look at her wound today 5/3; patient saw Dr. Orvan Falconer on 4/28. I did send him a note asking about the continuation of perhaps an oral regimen. I have not heard back. She still has an elevated sedimentation rate at 81. Her wounds are smaller. 5/6; in for her total contact cast change. We did not look at the wound today. She has seen Dr. Orvan Falconer and is now on oral Keflex and Flagyl. She seems to be tolerating these well 5/10; she  had her PICC line removed. The wound on the right foot after some initial improvement has not made any improvement even with the total contact cast. Still having a lot of drainage. Likewise the area on the left foot really is not any better either 5/14; in for a cast change today. We will look at the right foot on Monday. If this is not making any progress I am going to try to cast the left foot. 5/17; the wound on the right foot is absolutely no different. I am going to put ointment on this area and change the cast of the left foot. We have been using silver alginate there 5/21; apparently the area on the right foot was better per our intake nurse although I did not see it. We did a standard total contact cast change on the left foot we have been applying silver alginate to the wound here. 06/10/19-Area on the right foot per dimensions slightly better, the left foot has still some depth applying silver alginate to the wound with TCC 06/13/19-Patient here for Casting to Left foot 6/1; patient had the total contact cast were placed on the left foot. Noted some swelling in her right leg although the dimensions of the wound on the right foot are better. We have been using PolyMem on the right and silver alginate on the left under the cast 6/7; we continue to make nice progress in both her plantar feet wound. We are putting the total contact cast on the left. Surprisingly in spite of this the area on the right medial foot in the middle of her Charcot deformities actually is making progress as well 07/01/2019 upon evaluation today patient actually appears to be doing quite well with regard to her wounds on the feet compared to when I saw her last that has been quite some time. With that being said I do believe the total contact cast has been beneficial in this left foot and she has a lot of signs of improvement she does have some callus overhanging  which is caused a little bit of a ledge and an issue here. I  do believe that we may be able to do something to help in that regard. Fortunately otherwise the wound seems to be progressing quite nicely. 6/21; the patient's area on the left foot that we are currently casting is just about closed. The area on the right medial foot has a healthy looking surface but may be not changed as much from last week in terms of surface area. We are using silver alginate on the right and polymen Ag under the cast 6/28; only a small open area on the left foot remains. Using silver alginate under a total contact cast. She has a diabetic shoe I have asked her to bring that for next week where this may be closed. The area on the right foot with a Charcot deformity is also measuring slightly smaller. We are using PolyMem Ag here 7/6; left foot is totally closed. She does not require a total contact cast. She will graduate to her own diabetic shoe. ooThe area on the right foot with a Charcot deformity measuring about the same. We have been using polymen under compression wraps. She did not do well in this area with a total contact cast 7/13; left foot remains intact. She is skillfully covering this area with foam and using her diabetic shoe. Slight area improvement of the right foot wound. Still rolled senescent looking edges. We have been using polymen 7/19; left foot remains intact. Wound measuring slightly smaller on the right foot in the middle of the Charcot deformity. We have been using PolyMem Ag 7/26; per the patient left foot remains intact. The wound on the right foot looks slightly smaller but dimensions are measuring the same. We have been using polymen Ag 8/9-Patient back after 2 weeks, wound of the right foot again looking slightly smaller, continuing to use PolyMem silver 8/16; patient has made some minor improvements in wound area on the right foot. Using polymen Ag. The left foot remains healed 8/23; continued slight improvement in dimensions. Rolled edges around  the wound edge noted. We have been using polymen Ag. Patient offloading with a surgical shoe and crutches 8/30; no change in surface area. Thick rolled skin and subcutaneous tissue around the edges debris on the wound surface. We have been using polymen Ag initially with some improvement however stalled on the last 2 weeks in terms of surface area 9/13; surface area is smaller still using polymen Ag. Thick rolled skin and subcutaneous tissue around the edges requiring debridement. 9/20; again surface area is improved using polymen Ag however each week she develops thick rolled skin and subcutaneous tissue around the wound edges requiring debridement. She is offloading this using crutches 9/27; we've been using PolyMem AG. Wound does not look any different from last week. Raised edges of callus skin and subcutaneous tissue around the wound bed. 10/5; we been using polymen Ag. No ability to put a total contact cast on today in the clinic. Wound is about the same still rolled edges of thick callus around the margins 10/18; we put her in a total contact cast last week things did not go well. Not only does the wound looked worse macerated angry with thick callus and some warmth around it. She had another wound above this and one on the anterior right leg. I am not totally certain why this happened. She has not been systemically unwell 10/25; culture from last week grew Pseudomonas and MRSA. My experience  with this MRSA is usually a true infection and Pseudomonas a colonizer I am going to put her on doxycycline today we put gentamicin on the wound surface. Continuing with silver alginate 11/1; she has tolerated her antibiotics. I gave her doxycycline. Was still using gentamicin. The original wound looks about the same thick rolled edges. The cast injury just above it is superficial 11/8; even after an aggressive debridement last week the wound on her medial Charcot foot on the right looks about the same.  The surface looks satisfactory however rolled edges of callus thick skin and subcutaneous tissue. She did not do well on 2 separate occasions in a total contact cast. The cast abrasion she has on the medial foot is just above closed. We have been using polymen Ag. She is attempting to offload this is much as she can with her crutches 11/15; the wound on the Charcot right foot about the same in terms of surface area.. The area above this on the ankle is healed. Objective Constitutional Sitting or standing Blood Pressure is within target range for patient.. Pulse regular and within target range for patient.Marland Kitchen Respirations regular, non-labored and within target range.. Temperature is normal and within the target range for the patient.Marland Kitchen Appears in no distress. Vitals Time Taken: 1:49 PM, Height: 68 in, Source: Stated, Weight: 265 lbs, Source: Stated, BMI: 40.3, Temperature: 97.5 F, Pulse: 75 bpm, Respiratory Rate: 18 breaths/min, Blood Pressure: 122/71 mmHg, Capillary Blood Glucose: 134 mg/dl. General Notes: glucose per pt report this am Cardiovascular Pedal pulses are palpable. General Notes: Wound exam; right medial plantar foot. Thick rolled edges of callus and subcutaneous tissue around the wound margin. The wound bed itself looks very healthy. Healthy granulation. I did not debride this today I did this last week its right back. The cast injury just above this has closed Integumentary (Hair, Skin) Wound #12 status is Healed - Epithelialized. Original cause of wound was Trauma. The wound is located on the Right,Medial Malleolus. The wound measures 0cm length x 0cm width x 0cm depth; 0cm^2 area and 0cm^3 volume. There is no tunneling or undermining noted. There is a none present amount of drainage noted. The wound margin is flat and intact. There is no granulation within the wound bed. There is no necrotic tissue within the wound bed. Wound #9 status is Open. Original cause of wound was Blister.  The wound is located on the Right,Medial Foot. The wound measures 1.3cm length x 1.4cm width x 0.5cm depth; 1.429cm^2 area and 0.715cm^3 volume. There is Fat Layer (Subcutaneous Tissue) exposed. There is no tunneling or undermining noted. There is a medium amount of serosanguineous drainage noted. The wound margin is thickened. There is small (1-33%) red, pale granulation within the wound bed. There is a large (67-100%) amount of necrotic tissue within the wound bed including Adherent Slough. 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 There was a Three Layer Compression Therapy Procedure by Zandra Abts, RN. Post procedure Diagnosis Wound #9: Same as Pre-Procedure Plan Follow-up Appointments: Return Appointment in 2 weeks. Nurse Visit: - Tuesday 11/23 Dressing Change Frequency: Do not change entire dressing for one week. Wound Cleansing: May shower with protection. - use cast  protector Primary Wound Dressing: Wound #9 Right,Medial Foot: Polymem Silver Secondary Dressing: Wound #9 Right,Medial Foot: ABD pad Zetuvit or Kerramax Other: - felt callous pad Edema Control: 3 Layer Compression System - Right Lower Extremity Avoid standing for long periods of time Elevate legs to the level of the heart or above for 30 minutes daily and/or when sitting, a frequency of: - throughout the day Exercise regularly Off-Loading: Open toe surgical shoe to: - right foot 1. I continued with the polymen Ag I started last week. The wound bed certainly looks better. 2. I have not been able to get the circular thick callus and rolled edges of skin and  subcutaneous tissue to look like a healing circumference/wound edge. Even with aggressive debridement it does not seem to matter it simply returns. I think this is simply too much of weightbearing surface for this patient. 3. I have asked her to keep the pressure off this area as much as she can pop. She understands this. 4. She did not tolerate a total contact cast 5. Continue with 3 layer compression Electronic Signature(s) Signed: 12/02/2019 5:39:18 PM By: Sarah Najjar MD Entered By: [name] on 12/02/2019 15:30:47 -------------------------------------------------------------------------------- SuperBill Details Patient Name: Date of Service: [name], [name] 12/02/2019 Medical Record Number: [id number] Patient Account Number: [id number] Date of Birth/Sex: Treating RN: 04-Apr-1955 (64 y.o. [name] Primary Care Provider: MA SO [name], Omao Sarah LSA DA [name] Other Clinician: Referring Provider: Treating Provider/Extender: Abe People, ELHA Sarah LSA [name] Weeks in Treatment: 51 Diagnosis Coding ICD-10 Codes Code Description E11.621 Type 2 diabetes mellitus with foot ulcer L97.511 Non-pressure chronic ulcer of other part of right foot limited to breakdown of skin E11.42 Type 2 diabetes mellitus with diabetic polyneuropathy M14.671 Charcot's joint, right ankle and foot Facility Procedures CPT4 Code: [id number] Description: (Facility Use Only) [phone] - APPLY MULTLAY COMPRS LWR RT LEG Modifier: Quantity: 1 Physician Procedures : CPT4 Code Description Modifier [id number] - WC PHYS LEVEL 3 - EST PT ICD-10 Diagnosis Description E11.621 Type 2 diabetes mellitus with foot ulcer E11.621 Type 2 diabetes mellitus with foot ulcer L97.511 Non-pressure chronic ulcer of other part of  right foot limited to breakdown of skin E11.42 Type 2 diabetes mellitus with diabetic polyneuropathy Quantity: 1 Electronic Signature(s) Signed: 12/02/2019 5:39:18 PM By: Sarah Najjar  MD Signed: 12/02/2019 5:51:53 PM By: Zandra Abts RN, BSN Entered By: Zandra Abts on 12/02/2019 17:24:55

## 2019-12-03 NOTE — Progress Notes (Signed)
DECLYNN, LOPRESTI ([id number]) Visit Report for 12/02/2019 Arrival Information Details Patient Name: Date of Service: [name], [name] 12/02/2019 1:45 PM Medical Record Number: [id number] Patient Account Number: [id number] Date of Birth/Sex: Treating RN: 01-12-1956 (64 y.o. [name] Primary Care Manan Olmo: MA SO UDI, [place] MA LSA DA T Other Clinician: Referring Delvecchio Madole: Treating Letcher Schweikert/Extender: Baltazar Najjar MA SO UDI, ELHA MA LSA DA T Weeks in Treatment: 38 Visit Information History Since Last Visit Added or deleted any medications: No Patient Arrived: Crutches Any new allergies or adverse reactions: No Arrival Time: 13:47 Had a fall or experienced change in No Accompanied By: self activities of daily living that may affect Transfer Assistance: None risk of falls: Patient Identification Verified: Yes Signs or symptoms of abuse/neglect since No Secondary Verification Process Completed: Yes last visito Patient Requires Transmission-Based Precautions: No Hospitalized since last visit: No Patient Has Alerts: No Implantable device outside of the clinic No excluding cellular tissue based products placed in the center since last visit: Has Dressing in Place as Prescribed: Yes Has Compression in Place as Prescribed: Yes Has Footwear/Offloading in Place as Yes Prescribed: Right: Surgical Shoe with Pressure Relief Insole Pain Present Now: No Electronic Signature(s) Signed: 12/03/2019 5:21:16 PM By: Zenaida Deed RN, BSN Entered By: Zenaida Deed on 12/02/2019 13:48:17 -------------------------------------------------------------------------------- Compression Therapy Details Patient Name: Date of Service: [name], Maham J. 12/02/2019 1:45 PM Medical Record Number: [id number] Patient Account Number: [id number] Date of Birth/Sex: Treating RN: Jun 05, 1955 (64 y.o. [name] Primary Care Samyria Rudie: MA SO UDI, Judene Companion MA LSA DA T Other Clinician: Referring  Janeann Paisley: Treating Amahri Dengel/Extender: Baltazar Najjar MA SO UDI, ELHA MA LSA DA T Weeks in Treatment: 66 Compression Therapy Performed for Wound Assessment: Wound #9 Right,Medial Foot Performed By: Clinician Zandra Abts, RN Compression Type: Three Layer Post Procedure Diagnosis Same as Pre-procedure Electronic Signature(s) Signed: 12/02/2019 5:51:53 PM By: Zandra Abts RN, BSN Entered By: Zandra Abts on 12/02/2019 14:36:51 -------------------------------------------------------------------------------- Encounter Discharge Information Details Patient Name: Date of Service: [name], Lucely J. 12/02/2019 1:45 PM Medical Record Number: [id number] Patient Account Number: [id number] Date of Birth/Sex: Treating RN: 1955/04/07 (64 y.o. [name] Primary Care Darlene Bartelt: MA SO UDI, Judene Companion MA LSA DA T Other Clinician: Referring Peytyn Trine: Treating Deric Bocock/Extender: Baltazar Najjar MA SO UDI, ELHA MA LSA DA T Weeks in Treatment: 5 Encounter Discharge Information Items Discharge Condition: Stable Ambulatory Status: Crutches Discharge Destination: Home Transportation: Private Auto Accompanied By: self Schedule Follow-up Appointment: Yes Clinical Summary of Care: Electronic Signature(s) Signed: 12/02/2019 6:05:18 PM By: Shawn Stall Entered By: Shawn Stall on 12/02/2019 17:57:55 -------------------------------------------------------------------------------- Lower Extremity Assessment Details Patient Name: Date of Service: DARL, BRISBIN 12/02/2019 1:45 PM Medical Record Number: [id number] Patient Account Number: [id number] Date of Birth/Sex: Treating RN: December 10, 1955 (64 y.o. [name] Primary Care Analys Ryden: MA SO UDI, [place] MA LSA DA T Other Clinician: Referring Keylah Darwish: Treating Ronnica Dreese/Extender: Baltazar Najjar MA SO UDI, ELHA MA LSA DA T Weeks in Treatment: 44 Edema Assessment Assessed: [Left: No] [Right: No] Edema: [Left: N] [Right:  o] Calf Left: Right: Point of Measurement: 43 cm From Medial Instep 33 cm Ankle Left: Right: Point of Measurement: 12 cm From Medial Instep 22.4 cm Vascular Assessment Pulses: Dorsalis Pedis Palpable: [Right:Yes] Electronic Signature(s) Signed: 12/03/2019 5:21:16 PM By: Zenaida Deed RN, BSN Entered By: Zenaida Deed on 12/02/2019 13:59:34 -------------------------------------------------------------------------------- Multi Wound Chart Details Patient Name: Date of Service: [name], Jenascia J. 12/02/2019 1:45 PM Medical Record Number: [id number] Patient Account Number: [id number]  Date of Birth/Sex: Treating RN: 1955/09/18 (64 y.o. [name] Primary Care Nami Strawder: MA SO UDI, [place] MA LSA DA T Other Clinician: Referring Anastasiya Gowin: Treating Aalijah Mims/Extender: Baltazar Najjar MA SO UDI, ELHA MA LSA DA T Weeks in Treatment: 90 Vital Signs Height(in): 68 Capillary Blood Glucose(mg/dl): 121 Weight(lbs): 975 Pulse(bpm): 75 Body Mass Index(BMI): 40 Blood Pressure(mmHg): 122/71 Temperature(F): 97.5 Respiratory Rate(breaths/min): 18 Photos: [12:No Photos Right, Medial Malleolus] [9:No Photos Right, Medial Foot] [N/A:N/A N/A] Wound Location: [12:Trauma] [9:Blister] [N/A:N/A] Wounding Event: [12:Diabetic Wound/Ulcer of the Lower] [9:Diabetic Wound/Ulcer of the Lower] [N/A:N/A] Primary Etiology: [12:Extremity Glaucoma, Chronic sinus] [9:Extremity Glaucoma, Chronic sinus] [N/A:N/A] Comorbid History: [12:problems/congestion, Anemia, Sleep Apnea, Hypertension, Type II Diabetes, Rheumatoid Arthritis, Neuropathy 11/04/2019] [9:problems/congestion, Anemia, Sleep Apnea, Hypertension, Type II Diabetes, Rheumatoid Arthritis, Neuropathy  06/18/2018] [N/A:N/A] Date Acquired: [12:4] [9:44] [N/A:N/A] Weeks of Treatment: [12:Healed - Epithelialized] [9:Open] [N/A:N/A] Wound Status: [12:0x0x0] [9:1.3x1.4x0.5] [N/A:N/A] Measurements L x W x D (cm) [12:0] [9:1.429] [N/A:N/A] A (cm) : rea  [12:0] [9:0.715] [N/A:N/A] Volume (cm) : [12:100.00%] [9:87.00%] [N/A:N/A] % Reduction in A rea: [12:100.00%] [9:92.80%] [N/A:N/A] % Reduction in Volume: [12:Grade 1] [9:Grade 2] [N/A:N/A] Classification: [12:None Present] [9:Medium] [N/A:N/A] Exudate A mount: [12:N/A] [9:Serosanguineous] [N/A:N/A] Exudate Type: [12:N/A] [9:red, brown] [N/A:N/A] Exudate Color: [12:Flat and Intact] [9:Thickened] [N/A:N/A] Wound Margin: [12:None Present (0%)] [9:Small (1-33%)] [N/A:N/A] Granulation A mount: [12:N/A] [9:Red, Pale] [N/A:N/A] Granulation Quality: [12:None Present (0%)] [9:Large (67-100%)] [N/A:N/A] Necrotic A mount: [12:Fascia: No] [9:Fat Layer (Subcutaneous Tissue): Yes N/A] Exposed Structures: [12:Fat Layer (Subcutaneous Tissue): No Tendon: No Muscle: No Joint: No Bone: No Large (67-100%)] [9:Fascia: No Tendon: No Muscle: No Joint: No Bone: No None] [N/A:N/A] Epithelialization: [12:N/A] [9:Compression Therapy] [N/A:N/A] Treatment Notes Electronic Signature(s) Signed: 12/02/2019 5:39:18 PM By: Baltazar Najjar MD Signed: 12/02/2019 5:51:53 PM By: Zandra Abts RN, BSN Entered By: Baltazar Najjar on 12/02/2019 15:27:34 -------------------------------------------------------------------------------- Multi-Disciplinary Care Plan Details Patient Name: Date of Service: [facility], Lorene J. 12/02/2019 1:45 PM Medical Record Number: [id number] Patient Account Number: [id number] Date of Birth/Sex: Treating RN: 12-Jan-1956 (64 y.o. [name] Primary Care Olyn Landstrom: MA SO UDI, Judene Companion MA LSA DA T Other Clinician: Referring Adalai Perl: Treating Elhadji Pecore/Extender: Baltazar Najjar MA SO UDI, ELHA MA LSA DA T Weeks in Treatment: 76 Active Inactive Wound/Skin Impairment Nursing Diagnoses: Impaired tissue integrity Knowledge deficit related to ulceration/compromised skin integrity Goals: Patient/caregiver will verbalize understanding of skin care regimen Date Initiated: 01/28/2019 Target  Resolution Date: 12/27/2019 Goal Status: Active Interventions: Assess patient/caregiver ability to obtain necessary supplies Assess patient/caregiver ability to perform ulcer/skin care regimen upon admission and as needed Assess ulceration(s) every visit Provide education on ulcer and skin care Notes: Electronic Signature(s) Signed: 12/02/2019 5:51:53 PM By: Zandra Abts RN, BSN Entered By: Zandra Abts on 12/02/2019 14:31:49 -------------------------------------------------------------------------------- Pain Assessment Details Patient Name: Date of Service: [name], Myeisha J. 12/02/2019 1:45 PM Medical Record Number: [id number] Patient Account Number: [id number] Date of Birth/Sex: Treating RN: [birth year] (64 y.o. [name] Primary Care Mckynleigh Mussell: MA SO UDI, [place] MA LSA DA T Other Clinician: Referring Othello Sgroi: Treating Dabria Wadas/Extender: Baltazar Najjar MA SO UDI, ELHA MA LSA DA T Weeks in Treatment: 54 Active Problems Location of Pain Severity and Description of Pain Patient Has Paino No Site Locations Rate the pain. Current Pain Level: 0 Pain Management and Medication Current Pain Management: Electronic Signature(s) Signed: 12/03/2019 5:21:16 PM By: Zenaida Deed RN, BSN Entered By: Zenaida Deed on 12/02/2019 13:52:03 -------------------------------------------------------------------------------- Patient/Caregiver Education Details Patient Name: Date of Service: [name], Lunabella J. 11/15/2021andnbsp1:45 PM Medical Record  Number: [id number] Patient Account Number: [id number] Date of Birth/Gender: Treating RN: 08/02/1955 (64 y.o. [name] Primary Care Physician: MA SO UDI, Judene Companion MA LSA DA T Other Clinician: Referring Physician: Treating Physician/Extender: Baltazar Najjar MA SO UDI, ELHA MA LSA DA T Weeks in Treatment: 29 Education Assessment Education Provided To: Patient Education Topics Provided Wound/Skin Impairment: Methods:  Explain/Verbal Responses: State content correctly [employer]) Signed: 12/02/2019 5:51:53 PM By: Zandra Abts RN, BSN Entered By: Zandra Abts on 12/02/2019 14:32:06 -------------------------------------------------------------------------------- Wound Assessment Details Patient Name: Date of Service: [name], Dorthie J. 12/02/2019 1:45 PM Medical Record Number: [id number] Patient Account Number: [id number] Date of Birth/Sex: Treating RN: [birth year] (64 y.o. [name] Primary Care Jeronimo Hellberg: MA SO UDI, [place] MA LSA DA T Other Clinician: Referring Marwa Fuhrman: Treating Samyiah Halvorsen/Extender: Baltazar Najjar MA SO UDI, ELHA MA LSA DA T Weeks in Treatment: 75 Wound Status Wound Number: 12 Primary Diabetic Wound/Ulcer of the Lower Extremity Etiology: Wound Location: Right, Medial Malleolus Wound Healed - Epithelialized Wounding Event: Trauma Status: Date Acquired: 11/04/2019 Comorbid Glaucoma, Chronic sinus problems/congestion, Anemia, Sleep Weeks Of Treatment: 4 History: Apnea, Hypertension, Type II Diabetes, Rheumatoid Arthritis, Clustered Wound: No Neuropathy Wound Measurements Length: (cm) Width: (cm) Depth: (cm) Area: (cm) Volume: (cm) 0 % Reduction in Area: 100% 0 % Reduction in Volume: 100% 0 Epithelialization: Large (67-100%) 0 Tunneling: No 0 Undermining: No Wound Description Classification: Grade 1 Wound Margin: Flat and Intact Exudate Amount: None Present Foul Odor After Cleansing: No Slough/Fibrino No Wound Bed Granulation Amount: None Present (0%) Exposed Structure Necrotic Amount: None Present (0%) Fascia Exposed: No Fat Layer (Subcutaneous Tissue) Exposed: No Tendon Exposed: No Muscle Exposed: No Joint Exposed: No Bone Exposed: No Electronic Signature(s) Signed: 12/03/2019 5:21:16 PM By: Zenaida Deed RN, BSN Entered By: Zenaida Deed on 12/02/2019  14:04:23 -------------------------------------------------------------------------------- Wound Assessment Details Patient Name: Date of Service: [name], [place] J. 12/02/2019 1:45 PM Medical Record Number: [id number] Patient Account Number: [id number] Date of Birth/Sex: Treating RN: 07-26-1955 (64 y.o. [name] Primary Care Nnenna Meador: MA SO UDI, [place] MA LSA DA T Other Clinician: Referring Khyan Oats: Treating Dossie Ocanas/Extender: Baltazar Najjar MA SO UDI, ELHA MA LSA DA T Weeks in Treatment: 44 Wound Status Wound Number: 9 Primary Diabetic Wound/Ulcer of the Lower Extremity Etiology: Wound Location: Right, Medial Foot Wound Open Wounding Event: Blister Status: Date Acquired: 06/18/2018 Comorbid Glaucoma, Chronic sinus problems/congestion, Anemia, Sleep Weeks Of Treatment: 44 History: Apnea, Hypertension, Type II Diabetes, Rheumatoid Arthritis, Clustered Wound: No Neuropathy Wound Measurements Length: (cm) 1.3 Width: (cm) 1.4 Depth: (cm) 0.5 Area: (cm) 1.429 Volume: (cm) 0.715 % Reduction in Area: 87% % Reduction in Volume: 92.8% Epithelialization: None Tunneling: No Undermining: No Wound Description Classification: Grade 2 Wound Margin: Thickened Exudate Amount: Medium Exudate Type: Serosanguineous Exudate Color: red, brown Foul Odor After Cleansing: No Slough/Fibrino No Wound Bed Granulation Amount: Small (1-33%) Exposed Structure Granulation Quality: Red, Pale Fascia Exposed: No Necrotic Amount: Large (67-100%) Fat Layer (Subcutaneous Tissue) Exposed: Yes Necrotic Quality: Adherent Slough Tendon Exposed: No Muscle Exposed: No Joint Exposed: No Bone Exposed: No Treatment Notes Wound #9 (Right, Medial Foot) 1. Cleanse With Wound Cleanser Soap and water 2. Periwound Care Moisturizing lotion Skin Prep 3. Primary Dressing Applied Polymem Ag 4. Secondary Dressing ABD Pad Kerramax/Xtrasorb 6. Support Layer Applied 3 layer compression wrap 7.  Footwear/Offloading device applied Felt/Foam Notes felt to medial foot wound. Electronic Signature(s) Signed: 12/03/2019 5:21:16 PM By: Zenaida Deed RN, BSN Entered By: Zenaida Deed on 12/02/2019 14:04:54 -------------------------------------------------------------------------------- Vitals  Details Patient Name: Date of Service: IRIHANNA, PRESSNELL 12/02/2019 1:45 PM Medical Record Number: [id number] Patient Account Number: [id number] Date of Birth/Sex: Treating RN: [birth year] (64 y.o. [name] Primary Care Shaul Trautman: MA SO UDI, [place] MA LSA DA T Other Clinician: Referring Shalayne Leach: Treating Abdirahim Flavell/Extender: Baltazar Najjar MA SO UDI, ELHA MA LSA DA T Weeks in Treatment: 80 Vital Signs Time Taken: 13:49 Temperature (F): 97.5 Height (in): 68 Pulse (bpm): 75 Source: Stated Respiratory Rate (breaths/min): 18 Weight (lbs): 265 Blood Pressure (mmHg): 122/71 Source: Stated Capillary Blood Glucose (mg/dl): 111 Body Mass Index (BMI): 40.3 Reference Range: 80 - 120 mg / dl Notes glucose per pt report this am Electronic Signature(s) Signed: 12/03/2019 5:21:16 PM By: Zenaida Deed RN, BSN Entered By: Zenaida Deed on 12/02/2019 13:51:07

## 2019-12-09 ENCOUNTER — Encounter (HOSPITAL_BASED_OUTPATIENT_CLINIC_OR_DEPARTMENT_OTHER): Payer: Medicare Other | Admitting: Physician Assistant

## 2019-12-10 ENCOUNTER — Encounter (HOSPITAL_BASED_OUTPATIENT_CLINIC_OR_DEPARTMENT_OTHER): Payer: Medicare Other | Admitting: Internal Medicine

## 2019-12-10 ENCOUNTER — Other Ambulatory Visit: Payer: Self-pay

## 2019-12-10 DIAGNOSIS — E11621 Type 2 diabetes mellitus with foot ulcer: Secondary | ICD-10-CM | POA: Diagnosis not present

## 2019-12-10 NOTE — Progress Notes (Signed)
[name], [name] ([id number]) Visit Report for 12/10/2019 Arrival Information Details Patient Name: Date of Service: [name], [name] 12/10/2019 10:00 A M Medical Record Number: [id number] Patient Account Number: [id number] Date of Birth/Sex: Treating RN: 1955/06/01 (64 y.o. [name] Primary Care Kayliah Tindol: MA SO UDI, [place] MA LSA DA T Other Clinician: Referring Savi Lastinger: Treating Dvaughn Fickle/Extender: Baltazar Najjar MA SO UDI, ELHA MA LSA DA T Weeks in Treatment: 45 Visit Information History Since Last Visit Added or deleted any medications: No Patient Arrived: Crutches Any new allergies or adverse reactions: No Arrival Time: 10:18 Had a fall or experienced change in No Accompanied By: alone activities of daily living that may affect Transfer Assistance: None risk of falls: Patient Identification Verified: Yes Signs or symptoms of abuse/neglect since last visito No Secondary Verification Process Completed: Yes Hospitalized since last visit: No Patient Requires Transmission-Based Precautions: No Implantable device outside of the clinic excluding No Patient Has Alerts: No cellular tissue based products placed in the center since last visit: Has Dressing in Place as Prescribed: Yes Has Compression in Place as Prescribed: Yes Pain Present Now: No Electronic Signature(s) Signed: 12/10/2019 4:11:31 PM By: Zandra Abts RN, BSN Entered By: Zandra Abts on 12/10/2019 10:19:19 -------------------------------------------------------------------------------- Compression Therapy Details Patient Name: Date of Service: [name], [name] 12/10/2019 10:00 A M Medical Record Number: [id number] Patient Account Number: [id number] Date of Birth/Sex: Treating RN: November 24, 1955 (64 y.o. [name] Primary Care Nikaela Coyne: MA SO UDI, Judene Companion MA LSA DA T Other Clinician: Referring Marilou Barnfield: Treating Eddye Broxterman/Extender: Baltazar Najjar MA SO UDI, ELHA MA LSA DA T Weeks in  Treatment: 45 Compression Therapy Performed for Wound Assessment: Wound #9 Right,Medial Foot Performed By: Clinician Zandra Abts, RN Compression Type: Three Emergency planning/management officer) Signed: 12/10/2019 4:11:31 PM By: Zandra Abts RN, BSN Entered By: Zandra Abts on 12/10/2019 10:35:06 -------------------------------------------------------------------------------- Encounter Discharge Information Details Patient Name: Date of Service: [name], [name] 12/10/2019 10:00 A M Medical Record Number: [id number] Patient Account Number: [id number] Date of Birth/Sex: Treating RN: 1955-10-03 (64 y.o. [name] Primary Care Tinsley Everman: MA SO UDI, Judene Companion MA LSA DA T Other Clinician: Referring Amour Trigg: Treating Leverett Camplin/Extender: Baltazar Najjar MA SO UDI, ELHA MA LSA DA T Weeks in Treatment: 13 Encounter Discharge Information Items Discharge Condition: Stable Ambulatory Status: Crutches Discharge Destination: Home Transportation: Private Auto Accompanied By: alone Schedule Follow-up Appointment: Yes Clinical Summary of Care: Patient Declined Electronic Signature(s) Signed: 12/10/2019 4:11:31 PM By: Zandra Abts RN, BSN Entered By: Zandra Abts on 12/10/2019 10:36:34 -------------------------------------------------------------------------------- Wound Assessment Details Patient Name: Date of Service: [name], [name] 12/10/2019 10:00 A M Medical Record Number: [id number] Patient Account Number: [id number] Date of Birth/Sex: Treating RN: [birth year] (64 y.o. [name] Primary Care Cyruss Arata: MA SO UDI, [place] MA LSA DA T Other Clinician: Referring Hazle Ogburn: Treating Neiko Trivedi/Extender: Baltazar Najjar MA SO UDI, ELHA MA LSA DA T Weeks in Treatment: 45 Wound Status Wound Number: 9 Primary Diabetic Wound/Ulcer of the Lower Extremity Etiology: Wound Location: Right, Medial Foot Wound Open Wounding Event: Blister Status: Date Acquired: 06/18/2018 Comorbid  Glaucoma, Chronic sinus problems/congestion, Anemia, Sleep Weeks Of Treatment: 45 History: Apnea, Hypertension, Type II Diabetes, Rheumatoid Arthritis, Clustered Wound: No Neuropathy Wound Measurements Length: (cm) 1.3 Width: (cm) 1.4 Depth: (cm) 0.5 Area: (cm) 1.429 Volume: (cm) 0.715 % Reduction in Area: 87% % Reduction in Volume: 92.8% Epithelialization: None Tunneling: No Undermining: No Wound Description Classification: Grade 2 Wound Margin: Thickened Exudate Amount: Medium Exudate Type: Serosanguineous Exudate Color: red, brown  Foul Odor After Cleansing: No Slough/Fibrino No Wound Bed Granulation Amount: Large (67-100%) Exposed Structure Granulation Quality: Red Fascia Exposed: No Necrotic Amount: Small (1-33%) Fat Layer (Subcutaneous Tissue) Exposed: Yes Necrotic Quality: Adherent Slough Tendon Exposed: No Muscle Exposed: No Joint Exposed: No Bone Exposed: No Treatment Notes Wound #9 (Right, Medial Foot) 1. Cleanse With Wound Cleanser Soap and water 2. Periwound Care Moisturizing lotion 3. Primary Dressing Applied Other primary dressing (specifiy in notes) 4. Secondary Dressing ABD Pad Kerramax/Xtrasorb Other secondary dressing (specify in notes) 6. Support Layer Applied 3 layer compression wrap Notes Polymem Ag primary, Zetuvit and felt Agricultural engineer) Signed: 12/10/2019 4:11:31 PM By: Zandra Abts RN, BSN Entered By: Zandra Abts on 12/10/2019 10:34:46 -------------------------------------------------------------------------------- Vitals Details Patient Name: Date of Service: [name], [name] 12/10/2019 10:00 A M Medical Record Number: [id number] Patient Account Number: [id number] Date of Birth/Sex: Treating RN: 04/26/1955 (64 y.o. [name] Primary Care Aviyah Swetz: MA SO UDI, [place] MA LSA DA T Other Clinician: Referring Nixon Sparr: Treating Latoiya Maradiaga/Extender: Baltazar Najjar MA SO UDI, ELHA MA LSA DA T Weeks  in Treatment: 45 Vital Signs Time Taken: 10:19 Temperature (F): 97.5 Height (in): 68 Pulse (bpm): 83 Weight (lbs): 265 Respiratory Rate (breaths/min): 18 Body Mass Index (BMI): 40.3 Blood Pressure (mmHg): 133/82 Reference Range: 80 - 120 mg / dl Electronic Signature(s) Signed: 12/10/2019 4:11:31 PM By: Zandra Abts RN, BSN Entered By: Zandra Abts on 12/10/2019 10:19:39

## 2019-12-16 ENCOUNTER — Encounter (HOSPITAL_BASED_OUTPATIENT_CLINIC_OR_DEPARTMENT_OTHER): Payer: Medicare Other | Admitting: Internal Medicine

## 2019-12-16 ENCOUNTER — Other Ambulatory Visit: Payer: Self-pay

## 2019-12-16 DIAGNOSIS — E11621 Type 2 diabetes mellitus with foot ulcer: Secondary | ICD-10-CM | POA: Diagnosis not present

## 2019-12-16 NOTE — Progress Notes (Signed)
[name], [name] ([id number]) Visit Report for 12/10/2019 SuperBill Details Patient Name: Date of Service: [name], [name] 12/10/2019 Medical Record Number: [id number] Patient Account Number: [id number] Date of Birth/Sex: Treating RN: Jan 01, 1956 (64 y.o. 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

## 2019-12-17 ENCOUNTER — Other Ambulatory Visit: Payer: Self-pay

## 2019-12-17 ENCOUNTER — Encounter: Payer: Self-pay | Admitting: Internal Medicine

## 2019-12-17 ENCOUNTER — Ambulatory Visit (INDEPENDENT_AMBULATORY_CARE_PROVIDER_SITE_OTHER): Payer: Medicare Other | Admitting: Internal Medicine

## 2019-12-17 VITALS — BP 124/79 | HR 69 | Temp 98.0°F | Ht 69.0 in | Wt 255.1 lb

## 2019-12-17 DIAGNOSIS — E1169 Type 2 diabetes mellitus with other specified complication: Secondary | ICD-10-CM | POA: Diagnosis present

## 2019-12-17 DIAGNOSIS — Z23 Encounter for immunization: Secondary | ICD-10-CM | POA: Diagnosis not present

## 2019-12-17 DIAGNOSIS — I10 Essential (primary) hypertension: Secondary | ICD-10-CM

## 2019-12-17 DIAGNOSIS — Z0000 Encounter for general adult medical examination without abnormal findings: Secondary | ICD-10-CM | POA: Insufficient documentation

## 2019-12-17 DIAGNOSIS — Z01818 Encounter for other preprocedural examination: Secondary | ICD-10-CM | POA: Insufficient documentation

## 2019-12-17 LAB — POCT GLYCOSYLATED HEMOGLOBIN (HGB A1C): Hemoglobin A1C: 6.3 % — AB (ref 4.0–5.6)

## 2019-12-17 LAB — GLUCOSE, CAPILLARY: Glucose-Capillary: 114 mg/dL — ABNORMAL HIGH (ref 70–99)

## 2019-12-17 NOTE — Assessment & Plan Note (Addendum)
Hemoglobin A1c today is 6.3. Noninsulin-dependent type 2 diabetes, well controlled on current regimen of Janumet 50-500 mg twice daily.  -Continue Janumet 50-500 mg twice daily -Continue aspirin and statin for cardiovascular risk control -Follow-up in clinic in 3 to 6 months

## 2019-12-17 NOTE — Patient Instructions (Signed)
Thank you for allowing [place] to provide your care today. You came in for check up. Your blood pressure is great today.  Please continue great about taking your medication and follow healthy diet. Your diabetes is well controlled on current medications.  Congratulations!  Please continue taking your medications as before I signed the paper for medication assistance program for Glucerna for you. He will receive a flu shot today.  Today we made no changes to your medications.    Please follow-up in the neck in 3 to 6 months for follow-up of diabetes, checking your cholesterol and basic blood work.  Should you have any questions or concerns please call the internal medicine clinic at [phone].

## 2019-12-17 NOTE — Assessment & Plan Note (Signed)
-  She has received both Pfizer COVID shots in August 2021. -Patient will receive flu shot in [facility] today. -Will be due for Pfizer COVID shot booster in February 2022. -We will check lipid panel next visit

## 2019-12-17 NOTE — Progress Notes (Signed)
.     CC: F/u of diabetes HPI:  [name] is a 64 y.o. female with PMHx as documented below, presented for DM 2 follow-up. Please refer to problem based charting for further details and assessment and plan of current problem and chronic medical conditions.  Past medical history: HTN, OSA, GERD, type II DM, Charcot foot, CKD 3, HLD, normocytic anemia, obesity  Medications: ASA, Lipitor, ferrous sulfate, Janumet 50-500 mg twice daily, lisinopril 10 mg daily, pantoprazole 20 mg twice daily, vitamin B12, vitamin C, zinc  Past Medical History:  Diagnosis Date  . Chronic normocytic anemia   . CKD (chronic kidney disease) stage 3, GFR 30-59 ml/min (HCC) 02/02/2013  . Diabetes mellitus type II, controlled (HCC) 06/17/2009  . Diabetic ulcer of both feet (HCC) 01/07/2010   Qualifier: Diagnosis of  By: [name] MD, Amanjot    . Essential hypertension 06/17/2009  . GERD (gastroesophageal reflux disease)   . Glaucoma   . H/O hiatal hernia   . Hyperlipidemia   . MRSA (methicillin resistant Staphylococcus aureus)   . Obstructive sleep apnea 05/26/2010   Review of Systems:   Constitutional: Negative for chills and fever.  Respiratory: Negative for shortness of breath.   Cardiovascular: Negative for chest pain and leg swelling.  Gastrointestinal: Negative for abdominal pain, nausea and vomiting.   Physical Exam:  Vitals:   12/17/19 0905  BP: 124/79  Pulse: 69  Temp: 98 F (36.7 C)  TempSrc: Oral  SpO2: 100%  Weight: 255 lb 1.6 oz (115.7 kg)  Height: 5\' 9"  (1.753 m)   Constitutional: Pleasant lady, in no acute distress cardiovascular: RRR, nl S1S2, no murmur Respiratory: Effort normal and breath sounds normal. No respiratory distress. No wheezes.  Neurological: Is alert and oriented x 3  Skin: Not diaphoretic. No erythema.  MSK: Charcot foot deformity of right foot. (Lower extremities are wrapped and under stocking.) Psychiatric: Normal mood and affect. Behavior is normal. Judgment  and thought content normal.   Assessment & Plan:   See Encounters Tab for problem based charting.  Patient discussed with Dr. 

## 2019-12-17 NOTE — Progress Notes (Signed)
JAKELINE, DAVE ([id number]) Visit Report for 12/16/2019 Debridement Details Patient Name: Date of Service: [name], [name] 12/16/2019 1:45 PM Medical Record Number: [id number] Patient Account Number: [id number] Date of Birth/Sex: Treating RN: 10/22/1955 (64 y.o. [name] Primary Care Provider: MA SO UDI, [place] MA LSA DA T Other Clinician: Referring Provider: Treating Provider/Extender: Baltazar Najjar MA SO UDI, ELHA MA LSA DA T Weeks in Treatment: 46 Debridement Performed for Assessment: Wound #9 Right,Medial Foot Performed By: Physician [name], MD Debridement Type: Debridement Severity of Tissue Pre Debridement: Fat layer exposed Level of Consciousness (Pre-procedure): Awake and Alert Pre-procedure Verification/Time Out Yes - 14:19 Taken: Start Time: 14:19 Pain Control: Lidocaine 4% T opical Solution T Area Debrided (L x W): otal 2 (cm) x 2 (cm) = 4 (cm) Tissue and other material debrided: Viable, Non-Viable, Callus, Slough, Subcutaneous, Slough Level: Skin/Subcutaneous Tissue Debridement Description: Excisional Instrument: Curette Bleeding: Minimum Hemostasis Achieved: Pressure End Time: 14:20 Procedural Pain: 0 Post Procedural Pain: 0 Response to Treatment: Procedure was tolerated well Level of Consciousness (Post- Awake and Alert procedure): Post Debridement Measurements of Total Wound Length: (cm) 1.5 Width: (cm) 1.3 Depth: (cm) 0.4 Volume: (cm) 0.613 Character of Wound/Ulcer Post Debridement: Improved Severity of Tissue Post Debridement: Fat layer exposed Post Procedure Diagnosis Same as Pre-procedure Electronic Signature(s) Signed: 12/16/2019 4:47:57 PM By: Baltazar Najjar MD Signed: 12/17/2019 6:12:47 PM By: Zandra Abts RN, BSN Entered By: Baltazar Najjar on 12/16/2019 15:00:11 -------------------------------------------------------------------------------- HPI Details Patient Name: Date of Service: [name], [name]  12/16/2019 1:45 PM Medical Record Number: [id number] Patient Account Number: [id number] Date of Birth/Sex: Treating RN: Apr 09, 1955 (64 y.o. [name] Primary Care Provider: MA SO UDI, [place] MA LSA DA T Other Clinician: Referring Provider: Treating Provider/Extender: Baltazar Najjar MA SO UDI, ELHA MA LSA DA T Weeks in Treatment: 38 History of Present Illness HPI Description: 64 yrs old bf here for follow up recurrent left charcot foot ulcer. She has DM. She is not a smoker. She states a blister developed 3 months ago at site of previous breakdown from 1 year ago. It was debrided at the podiatrist's office. She went to the ER and then sent here. She denies pain. Xray was negative for osteo. Culture from this clinic negative. 09/08/14 [address] [name] to [name] for Charcot foot. Seen by him and MRI scheduled 09/19/14. Prior silver collagen but this was not ordered last visit, patient has been rinsing with saline alone. Re institute silver collagen every other day. F/u 2 weeks with [name] as Labor Day holiday. Supplies ordered. 09/25/14; this is a patient with a Charcot foot on the left. she has a wound over the plantar aspect of her left calcaneus. She has been seen by [name] of orthopedic surgery and has had an MRI. She is apparently going within the next week or 2 for corrective surgery which will involve an external fixator. I don't have any of the details of this. 10/06/14; The patient is a 64 yrs old bf with a left Charcot foot and ulcer on the plantar. 12/01/14 Returns post surgery with [name]. Has follow up visit later this week with him, currently in facility and NWB over this extremity. States no drainage from foot and no current wound care. On exam callus and scab in place, suspect healed. Has external fixator in place. Will defer to [name] for management, ok to place moisturizing cream over scabs and callus and she will call if she requires  follow up  here. READMISSION 01/28/2019 Patient was in the clinic in 2016 for a wound on the plantar aspect of her left calcaneus. Predominantly cared for by [name] she was referred to [name] and had corrective surgery for the position of her left ankle I believe. She had previously been here in 2015 and then prior to that in 2011 2012 that I do not have these records. More recently she has developed fairly extensive wounds on the right medial foot, left lateral foot and a small area on the right medial great toe. Right medial foot wound has been there for 6 to 7 months, left foot wound for 2 to 3 months and the right great toe only over the last few weeks. She has been followed by [name] at Dr. Laverta [place] office it sounds as though she has been undergoing callus paring and silver nitrate. The patient has been washing these off with soap and water and plan applying dry dressing Past medical history, type 2 diabetes well controlled with a recent hemoglobin A1c of 7 on 11/16, type 2 diabetes with peripheral neuropathy, previous left Charcot joint surgery bilateral Charcot joints currently, stage III chronic renal failure, hypertension, obstructive sleep apnea, history of MRSA and gastroesophageal reflux. 1/18; this is a patient with bilateral Charcot feet. Plain x-rays that I did of both of these wounds that are either at bone or precariously close to bone were done. On the right foot this showed possible lytic destruction involving the anterior aspect of the talus which may represent a wound osteomyelitis. An MRI was recommended. On the left there was no definite lytic destruction although the wound is deep undermining's and I think probably requires an MRI on this basis in any case 1/25; patient was supposed to have her MRI last Friday of her bilateral feet however they would not do it because there was silver alginate in her dressings, will replace with calcium alginate today and will see if we  can get her done today 2/1; patient did not get her MRIs done last week because of issues with the MRI department receiving orders to do bilateral feet. She has 2 deep wounds in the setting of Charcot feet deformities bilaterally. Both of them at one point had exposed bone although I had trouble demonstrating that today. I am not sure that we can offload these very aggressively as I watched her leave the clinic last week her gait is very narrow and unsteady. 2/15; the MRI of her right foot did not show osteomyelitis potentially osteonecrosis of the talus. 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 The wounds are a large wound on the right medial foot and on the left a substantial wound on the left lateral plantar calcaneus. We have been using silver alginate. Ultimately I think we need to consider a total contact cast on the right while we work through the possibility of osteomyelitis in the left. I watched her walk out with her crutches I have some trepidation about the ability to walk with the cast on her right foot. Nevertheless with her Charcot deformities I do not think there is a way to heal these short of offloading them aggressively. 2/22; the culture of the bone in the left foot that I did last week showed a few Citrobacter Koseri as well as some streptococcal species. In my attempted bone for pathology did not actually yield a lot of bone the pathologist did not identify any  osteomyelitis. 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 Is promised today and aggressive debridement of the right foot and a total contact cast which surprisingly she seemed to tolerate quite well 03/13/2019 upon evaluation today patient appears to be doing well with her total contact cast she is here for the first cast change.  She had no areas of rubbing or any other complications at this point. Overall things are doing quite well. 2/26; the patient was kindly seen by Dr. Orvan Falconer of infectious disease on 2/25. She is now on IV vancomycin PICC line placement in 2 days and oral Flagyl. This was in response to her MRI showing osteomyelitis of the left calcaneus concerning for osteomyelitis. We have been putting her right leg in a total contact cast. Our nurses report drainage. She is tolerating the total contact cast well. 3/4; the patient is started her IV antibiotics and is taking IV vancomycin and oral Flagyl. She appears to be tolerating these both well. This is for osteomyelitis involving the left calcaneus. The area on the right in the setting of bilateral Charcot deformities did not have any bone infection on MRI. We put a total contact cast on her with silver alginate as the primary dressing and she returns today in follow-up. Per our intake nurse there was too much drainage to leave this on all week therefore we will bring her back for an early change 3/8; follow-up total contact cast she is still having a lot of drainage probably too much drainage from the right foot wound will week with the same cast. She will be back on Thursday. The area on the left looks somewhat better 3/11; patient here for a total contact cast change. 3/15; patient came in for wound care evaluation. She is still on IV vancomycin and oral Flagyl for osteomyelitis in the left foot. The area on the right foot we are putting in a total contact cast. 3/18 for total contact cast change on the right foot 3/22; the patient was here for wound review today. The area on the right foot is slightly smaller in terms of surface area. However the surface of the wound is very gritty and hyper granulated. More problematically the area on the plantar left foot has increased depth indeed in the center of this it probes to bone. We have been using Hydrofera  Blue in both areas. She is on antibiotics as prescribed by infectious disease IV vancomycin and oral Flagyl 3/25; the patient is in for total contact cast change. Our intake nurse was concerned about the amount of drainage which soaked right to the multiple layers we are putting on this. Wondered about options. The wound bed actually looks as healthy as this is looked although there may not be a lot of change in dimensions things are looking a lot better. If we are going to heal this woman's wound which is in the middle of her right Charcot foot this is going to need to be offloaded. There are not many alternatives 3/29; still not a lot of improvement although the surface of the right wound looks better. We have been using Hydrofera Blue 04/17/2019 upon evaluation today patient appears to be doing well with a total contact cast. With actually having to change this twice a week. She saw [name] for wound care earlier in the week this  is for the cast change today. That is due to the fact that she has significant drainage. Overall the wound is been looking excellent however. 4/5; we continue to put a total contact cast on this patient with Hydrofera Blue. Still a lot of drainage which precludes a simple weekly change or changing this twice a week. 4/8; total contact cast changed today. Apparently the wound looks better per our intake nurse 4/12; patient here for wound care evaluation. Wounds on the right foot have not changed in dimension none about a month left foot looks similar. Apparently her antibiotics that we are giving her for osteomyelitis in the left foot complete on Wednesday. We have been using Hydrofera Blue on the right foot under a total contact cast. Nurses report greenish drainage although I think this may be discoloration from the [facility] 4/15; back for cast change wounds look quite good although still moderate amount of drainage. T contact cast reapplied otal 4/19;. Wound  evaluation. Wound on the right foot is smaller surface looks healthy. There is still the divot in the middle part of this wound but I could not feel any bone. Area on the left foot also looks better She has completed her antibiotics but still has the PICC line in. 4/23; patient comes back for a total contact cast change this was done in the standard fashion no issues 4/26; wound measures slightly larger on the right foot which is disappointing. She sees Dr. Orvan Falconer with regards to her antibiotics on Wednesday. I believe she is on vancomycin IV and oral Flagyl. 4/29; in for her routine total contact cast change. She saw Dr. Orvan Falconer this morning. He is asking about antibiotic continuation I will be in touch with him. I did not look at her wound today 5/3; patient saw Dr. Orvan Falconer on 4/28. I did send him a note asking about the continuation of perhaps an oral regimen. I have not heard back. She still has an elevated sedimentation rate at 81. Her wounds are smaller. 5/6; in for her total contact cast change. We did not look at the wound today. She has seen Dr. Orvan Falconer and is now on oral Keflex and Flagyl. She seems to be tolerating these well 5/10; she had her PICC line removed. The wound on the right foot after some initial improvement has not made any improvement even with the total contact cast. Still having a lot of drainage. Likewise the area on the left foot really is not any better either 5/14; in for a cast change today. We will look at the right foot on Monday. If this is not making any progress I am going to try to cast the left foot. 5/17; the wound on the right foot is absolutely no different. I am going to put ointment on this area and change the cast of the left foot. We have been using silver alginate there 5/21; apparently the area on the right foot was better per our intake nurse although I did not see it. We did a standard total contact cast change on the left foot we have been  applying silver alginate to the wound here. 06/10/19-Area on the right foot per dimensions slightly better, the left foot has still some depth applying silver alginate to the wound with TCC 06/13/19-Patient here for Casting to Left foot 6/1; patient had the total contact cast were placed on the left foot. Noted some swelling in her right leg although the dimensions of the wound on the right foot  are better. We have been using PolyMem on the right and silver alginate on the left under the cast 6/7; we continue to make nice progress in both her plantar feet wound. We are putting the total contact cast on the left. Surprisingly in spite of this the area on the right medial foot in the middle of her Charcot deformities actually is making progress as well 07/01/2019 upon evaluation today patient actually appears to be doing quite well with regard to her wounds on the feet compared to when I saw her last that has been quite some time. With that being said I do believe the total contact cast has been beneficial in this left foot and she has a lot of signs of improvement she does have some callus overhanging which is caused a little bit of a ledge and an issue here. I do believe that we may be able to do something to help in that regard. Fortunately otherwise the wound seems to be progressing quite nicely. 6/21; the patient's area on the left foot that we are currently casting is just about closed. The area on the right medial foot has a healthy looking surface but may be not changed as much from last week in terms of surface area. We are using silver alginate on the right and polymen Ag under the cast 6/28; only a small open area on the left foot remains. Using silver alginate under a total contact cast. She has a diabetic shoe I have asked her to bring that for next week where this may be closed. The area on the right foot with a Charcot deformity is also measuring slightly smaller. We are using PolyMem Ag  here 7/6; left foot is totally closed. She does not require a total contact cast. She will graduate to her own diabetic shoe. The area on the right foot with a Charcot deformity measuring about the same. We have been using polymen under compression wraps. She did not do well in this area with a total contact cast 7/13; left foot remains intact. She is skillfully covering this area with foam and using her diabetic shoe. Slight area improvement of the right foot wound. Still rolled senescent looking edges. We have been using polymen 7/19; left foot remains intact. Wound measuring slightly smaller on the right foot in the middle of the Charcot deformity. We have been using PolyMem Ag 7/26; per the patient left foot remains intact. The wound on the right foot looks slightly smaller but dimensions are measuring the same. We have been using polymen Ag 8/9-Patient back after 2 weeks, wound of the right foot again looking slightly smaller, continuing to use PolyMem silver 8/16; patient has made some minor improvements in wound area on the right foot. Using polymen Ag. The left foot remains healed 8/23; continued slight improvement in dimensions. Rolled edges around the wound edge noted. We have been using polymen Ag. Patient offloading with a surgical shoe and crutches 8/30; no change in surface area. Thick rolled skin and subcutaneous tissue around the edges debris on the wound surface. We have been using polymen Ag initially with some improvement however stalled on the last 2 weeks in terms of surface area 9/13; surface area is smaller still using polymen Ag. Thick rolled skin and subcutaneous tissue around the edges requiring debridement. 9/20; again surface area is improved using polymen Ag however each week she develops thick rolled skin and subcutaneous tissue around the wound edges requiring debridement. She is offloading this using  crutches 9/27; we've been using PolyMem AG. Wound does not look any  different from last week. Raised edges of callus skin and subcutaneous tissue around the wound bed. 10/5; we been using polymen Ag. No ability to put a total contact cast on today in the clinic. Wound is about the same still rolled edges of thick callus around the margins 10/18; we put her in a total contact cast last week things did not go well. Not only does the wound looked worse macerated angry with thick callus and some warmth around it. She had another wound above this and one on the anterior right leg. I am not totally certain why this happened. She has not been systemically unwell 10/25; culture from last week grew Pseudomonas and MRSA. My experience with this MRSA is usually a true infection and Pseudomonas a colonizer I am going to put her on doxycycline today we put gentamicin on the wound surface. Continuing with silver alginate 11/1; she has tolerated her antibiotics. I gave her doxycycline. Was still using gentamicin. The original wound looks about the same thick rolled edges. The cast injury just above it is superficial 11/8; even after an aggressive debridement last week the wound on her medial Charcot foot on the right looks about the same. The surface looks satisfactory however rolled edges of callus thick skin and subcutaneous tissue. She did not do well on 2 separate occasions in a total contact cast. The cast abrasion she has on the medial foot is just above closed. We have been using polymen Ag. She is attempting to offload this is much as she can with her crutches 11/15; the wound on the Charcot right foot about the same in terms of surface area.. The area above this on the ankle is healed. 11/29; Charcot right foot wound. This is medially. Wound may be slightly smaller however still raised edges of callus and skin around the wound circumference. We have been using polymen Electronic Signature(s) Signed: 12/16/2019 4:47:57 PM By: Baltazar Najjar MD Entered By: Baltazar Najjar on 12/16/2019 15:01:08 -------------------------------------------------------------------------------- Physical Exam Details Patient Name: Date of Service: Sarah [name], Xavier J. 12/16/2019 1:45 PM Medical Record Number: [id number] Patient Account Number: [id number] Date of Birth/Sex: Treating RN: 06-04-1955 (64 y.o. [name] Primary Care Provider: MA SO UDI, Judene Companion MA LSA DA T Other Clinician: Referring Provider: Treating Provider/Extender: Baltazar Najjar MA SO UDI, ELHA MA LSA DA T Weeks in Treatment: 56 Constitutional Patient is hypertensive.. Pulse regular and within target range for patient.Marland Kitchen Respirations regular, non-labored and within target range.. Temperature is normal and within the target range for the patient.Marland Kitchen Appears in no distress. Cardiovascular Pedal pulses palpable. Notes Wound exam; right medial plantar foot. Thick rolled edges of callus and subcutaneous tissue around the wound margin is part for the course for her. I removed this with a #5 curette. Hemostasis with direct pressure. The wound itself may be slightly smaller Electronic Signature(s) Signed: 12/16/2019 4:47:57 PM By: Baltazar Najjar MD Entered By: Baltazar Najjar on 12/16/2019 15:03:17 -------------------------------------------------------------------------------- Physician Orders Details Patient Name: Date of Service: [facility], Crystalee J. 12/16/2019 1:45 PM Medical Record Number: [id number] Patient Account Number: [id number] Date of Birth/Sex: Treating RN: 02-10-1955 (64 y.o. Ardis Rowan, Lauren Primary Care Provider: MA SO UDI, Judene Companion MA LSA DA T Other Clinician: Referring Provider: Treating Provider/Extender: Baltazar Najjar MA SO UDI, ELHA MA LSA DA T Weeks in Treatment: 32 Verbal / Phone Orders: No Diagnosis Coding Follow-up Appointments Return Appointment in 1 week. Dressing Change  Frequency Do not change entire dressing for one week. Wound Cleansing May shower with  protection. - use cast protector Primary Wound Dressing Wound #9 Right,Medial Foot Polymem Silver Secondary Dressing Wound #9 Right,Medial Foot ABD pad Zetuvit or Kerramax Other: - felt callous pad Edema Control 3 Layer Compression System - Right Lower Extremity Avoid standing for long periods of time Elevate legs to the level of the heart or above for 30 minutes daily and/or when sitting, a frequency of: - throughout the day Exercise regularly Off-Loading Open toe surgical shoe to: - right foot Electronic Signature(s) Signed: 12/16/2019 4:47:57 PM By: Baltazar Najjar MD Signed: 12/16/2019 5:13:46 PM By: Fonnie Mu RN Previous Signature: 12/16/2019 2:08:43 PM Version By: Fonnie Mu RN Entered By: Fonnie Mu on 12/16/2019 14:22:31 -------------------------------------------------------------------------------- Problem List Details Patient Name: Date of Service: [name], [name] 12/16/2019 1:45 PM Medical Record Number: [id number] Patient Account Number: [id number] Date of Birth/Sex: Treating RN: 06-Dec-1955 (64 y.o. [name] Primary Care Provider: MA SO UDI, Judene Companion MA LSA DA T Other Clinician: Referring Provider: Treating Provider/Extender: Abe People, ELHA MA LSA DA T Weeks in Treatment: 97 Active Problems ICD-10 Encounter Code Description Active Date MDM Diagnosis E11.621 Type 2 diabetes mellitus with foot ulcer 01/28/2019 No Yes L97.511 Non-pressure chronic ulcer of other part of right foot limited to breakdown of 01/28/2019 No Yes skin E11.42 Type 2 diabetes mellitus with diabetic polyneuropathy 01/28/2019 No Yes M14.671 Charcot's joint, right ankle and foot 01/28/2019 No Yes Inactive Problems ICD-10 Code Description Active Date Inactive Date L97.524 Non-pressure chronic ulcer of other part of left foot with necrosis of bone 01/28/2019 01/28/2019 L97.514 Non-pressure chronic ulcer of other part of right foot with necrosis of  bone 01/28/2019 01/28/2019 W09.811 Charcot's joint, left ankle and foot 01/28/2019 01/28/2019 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 12/16/2019 1:45 PM Medical Record Number: [id number] Patient Account Number: [id number] Date of Birth/Sex: Treating RN: 22-Apr-1955 (64 y.o. [name] Primary Care Provider: MA SO UDI, [place] MA LSA DA T Other Clinician: Referring Provider: Treating Provider/Extender: Baltazar Najjar MA SO UDI, ELHA MA LSA DA T Weeks in Treatment: 46 Subjective History of Present Illness (HPI) 64 yrs old bf here for follow up recurrent left charcot foot ulcer. She has DM. She is not a smoker. She states a blister developed 3 months ago at site of previous breakdown from 1 year ago. It was debrided at the podiatrist's office. She went to the ER and then sent here. She denies pain. Xray was negative for osteo. Culture from this clinic negative. 09/08/[name] to [name] for Charcot foot. Seen by him and MRI scheduled 09/19/14. Prior silver collagen but this was not ordered last visit, patient has been rinsing with saline alone. Re institute silver collagen every other day. F/u 2 weeks with [name] as Labor Day holiday. Supplies ordered. 09/25/14; this is a patient with a Charcot foot on the left. she has a wound over the plantar aspect of her left calcaneus. She has been seen by [name] of orthopedic surgery and has had an MRI. She is apparently going within the next week or 2 for corrective surgery which will involve an external fixator. I don't have any of the details of this. 10/06/14; The  patient is a 64 yrs old bf with  a left Charcot foot and ulcer on the plantar. 12/01/14 Returns post surgery with [name]. Has follow up visit later this week with him, currently in facility and NWB over this extremity. States no drainage from foot and no current wound care. On exam callus and scab in place, suspect healed. Has external fixator in place. Will defer to [name] for management, ok to place moisturizing cream over scabs and callus and she will call if she requires follow up here. READMISSION 01/28/2019 Patient was in the clinic in 2016 for a wound on the plantar aspect of her left calcaneus. Predominantly cared for by [name] she was referred to [name] and had corrective surgery for the position of her left ankle I believe. She had previously been here in 2015 and then prior to that in 2011 2012 that I do not have these records. More recently she has developed fairly extensive wounds on the right medial foot, left lateral foot and a small area on the right medial great toe. Right medial foot wound has been there for 6 to 7 months, left foot wound for 2 to 3 months and the right great toe only over the last few weeks. She has been followed by [name] at Dr. Laverta [place] office it sounds as though she has been undergoing callus paring and silver nitrate. The patient has been washing these off with soap and water and plan applying dry dressing Past medical history, type 2 diabetes well controlled with a recent hemoglobin A1c of 7 on 11/16, type 2 diabetes with peripheral neuropathy, previous left Charcot joint surgery bilateral Charcot joints currently, stage III chronic renal failure, hypertension, obstructive sleep apnea, history of MRSA and gastroesophageal reflux. 1/18; this is a patient with bilateral Charcot feet. Plain x-rays that I did of both of these wounds that are either at bone or precariously close to bone were done. On the right foot this showed possible lytic destruction involving the  anterior aspect of the talus which may represent a wound osteomyelitis. An MRI was recommended. On the left there was no definite lytic destruction although the wound is deep undermining's and I think probably requires an MRI on this basis in any case 1/25; patient was supposed to have her MRI last Friday of her bilateral feet however they would not do it because there was silver alginate in her dressings, will replace with calcium alginate today and will see if we can get her done today 2/1; patient did not get her MRIs done last week because of issues with the MRI department receiving orders to do bilateral feet. She has 2 deep wounds in the setting of Charcot feet deformities bilaterally. Both of them at one point had exposed bone although I had trouble demonstrating that today. I am not sure that we can offload these very aggressively as I watched her leave the clinic last week her gait is very narrow and unsteady. 2/15; the MRI of her right foot did not show osteomyelitis potentially osteonecrosis of the talus. 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 The wounds are a large wound on the right medial foot and on the left a substantial wound on the left lateral plantar calcaneus. We have been using silver alginate. Ultimately I think we need to consider a total contact cast on the right while we work through the possibility of osteomyelitis in the left. I watched her  walk out with her crutches I have some trepidation about the ability to walk with the cast on her right foot. Nevertheless with her Charcot deformities I do not think there is a way to heal these short of offloading them aggressively. 2/22; the culture of the bone in the left foot that I did last week showed a few Citrobacter Koseri as well as some streptococcal species. In my attempted bone for pathology did not actually yield a lot of bone the pathologist did not identify any  osteomyelitis. 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 Is promised today and aggressive debridement of the right foot and a total contact cast which surprisingly she seemed to tolerate quite well 03/13/2019 upon evaluation today patient appears to be doing well with her total contact cast she is here for the first cast change. She had no areas of rubbing or any other complications at this point. Overall things are doing quite well. 2/26; the patient was kindly seen by Dr. Orvan Falconer of infectious disease on 2/25. She is now on IV vancomycin PICC line placement in 2 days and oral Flagyl. This was in response to her MRI showing osteomyelitis of the left calcaneus concerning for osteomyelitis. We have been putting her right leg in a total contact cast. Our nurses report drainage. She is tolerating the total contact cast well. 3/4; the patient is started her IV antibiotics and is taking IV vancomycin and oral Flagyl. She appears to be tolerating these both well. This is for osteomyelitis involving the left calcaneus. The area on the right in the setting of bilateral Charcot deformities did not have any bone infection on MRI. We put a total contact cast on her with silver alginate as the primary dressing and she returns today in follow-up. Per our intake nurse there was too much drainage to leave this on all week therefore we will bring her back for an early change 3/8; follow-up total contact cast she is still having a lot of drainage probably too much drainage from the right foot wound will week with the same cast. She will be back on Thursday. The area on the left looks somewhat better 3/11; patient here for a total contact cast change. 3/15; patient came in for wound care evaluation. She is still on IV vancomycin and oral Flagyl for osteomyelitis in the  left foot. The area on the right foot we are putting in a total contact cast. 3/18 for total contact cast change on the right foot 3/22; the patient was here for wound review today. The area on the right foot is slightly smaller in terms of surface area. However the surface of the wound is very gritty and hyper granulated. More problematically the area on the plantar left foot has increased depth indeed in the center of this it probes to bone. We have been using Hydrofera Blue in both areas. She is on antibiotics as prescribed by infectious disease IV vancomycin and oral Flagyl 3/25; the patient is in for total contact cast change. Our intake nurse was concerned about the amount of drainage which soaked right to the multiple layers we are putting on this. Wondered about options. The wound bed actually looks as healthy as this is looked although there may not be a lot of change in dimensions things are looking a lot better. If we  are going to heal this woman's wound which is in the middle of her right Charcot foot this is going to need to be offloaded. There are not many alternatives 3/29; still not a lot of improvement although the surface of the right wound looks better. We have been using Hydrofera Blue 04/17/2019 upon evaluation today patient appears to be doing well with a total contact cast. With actually having to change this twice a week. She saw [name] for wound care earlier in the week this is for the cast change today. That is due to the fact that she has significant drainage. Overall the wound is been looking excellent however. 4/5; we continue to put a total contact cast on this patient with Hydrofera Blue. Still a lot of drainage which precludes a simple weekly change or changing this twice a week. 4/8; total contact cast changed today. Apparently the wound looks better per our intake nurse 4/12; patient here for wound care evaluation. Wounds on the right foot have not changed in  dimension none about a month left foot looks similar. Apparently her antibiotics that we are giving her for osteomyelitis in the left foot complete on Wednesday. We have been using Hydrofera Blue on the right foot under a total contact cast. Nurses report greenish drainage although I think this may be discoloration from the [facility] 4/15; back for cast change wounds look quite good although still moderate amount of drainage. T contact cast reapplied otal 4/19;. Wound evaluation. Wound on the right foot is smaller surface looks healthy. There is still the divot in the middle part of this wound but I could not feel any bone. Area on the left foot also looks better She has completed her antibiotics but still has the PICC line in. 4/23; patient comes back for a total contact cast change this was done in the standard fashion no issues 4/26; wound measures slightly larger on the right foot which is disappointing. She sees Dr. Orvan Falconer with regards to her antibiotics on Wednesday. I believe she is on vancomycin IV and oral Flagyl. 4/29; in for her routine total contact cast change. She saw Dr. Orvan Falconer this morning. He is asking about antibiotic continuation I will be in touch with him. I did not look at her wound today 5/3; patient saw Dr. Orvan Falconer on 4/28. I did send him a note asking about the continuation of perhaps an oral regimen. I have not heard back. She still has an elevated sedimentation rate at 81. Her wounds are smaller. 5/6; in for her total contact cast change. We did not look at the wound today. She has seen Dr. Orvan Falconer and is now on oral Keflex and Flagyl. She seems to be tolerating these well 5/10; she had her PICC line removed. The wound on the right foot after some initial improvement has not made any improvement even with the total contact cast. Still having a lot of drainage. Likewise the area on the left foot really is not any better either 5/14; in for a cast change today.  We will look at the right foot on Monday. If this is not making any progress I am going to try to cast the left foot. 5/17; the wound on the right foot is absolutely no different. I am going to put ointment on this area and change the cast of the left foot. We have been using silver alginate there 5/21; apparently the area on the right foot was better per our intake nurse although  I did not see it. We did a standard total contact cast change on the left foot we have been applying silver alginate to the wound here. 06/10/19-Area on the right foot per dimensions slightly better, the left foot has still some depth applying silver alginate to the wound with TCC 06/13/19-Patient here for Casting to Left foot 6/1; patient had the total contact cast were placed on the left foot. Noted some swelling in her right leg although the dimensions of the wound on the right foot are better. We have been using PolyMem on the right and silver alginate on the left under the cast 6/7; we continue to make nice progress in both her plantar feet wound. We are putting the total contact cast on the left. Surprisingly in spite of this the area on the right medial foot in the middle of her Charcot deformities actually is making progress as well 07/01/2019 upon evaluation today patient actually appears to be doing quite well with regard to her wounds on the feet compared to when I saw her last that has been quite some time. With that being said I do believe the total contact cast has been beneficial in this left foot and she has a lot of signs of improvement she does have some callus overhanging which is caused a little bit of a ledge and an issue here. I do believe that we may be able to do something to help in that regard. Fortunately otherwise the wound seems to be progressing quite nicely. 6/21; the patient's area on the left foot that we are currently casting is just about closed. The area on the right medial foot has a healthy  looking surface but may be not changed as much from last week in terms of surface area. We are using silver alginate on the right and polymen Ag under the cast 6/28; only a small open area on the left foot remains. Using silver alginate under a total contact cast. She has a diabetic shoe I have asked her to bring that for next week where this may be closed. The area on the right foot with a Charcot deformity is also measuring slightly smaller. We are using PolyMem Ag here 7/6; left foot is totally closed. She does not require a total contact cast. She will graduate to her own diabetic shoe. ooThe area on the right foot with a Charcot deformity measuring about the same. We have been using polymen under compression wraps. She did not do well in this area with a total contact cast 7/13; left foot remains intact. She is skillfully covering this area with foam and using her diabetic shoe. Slight area improvement of the right foot wound. Still rolled senescent looking edges. We have been using polymen 7/19; left foot remains intact. Wound measuring slightly smaller on the right foot in the middle of the Charcot deformity. We have been using PolyMem Ag 7/26; per the patient left foot remains intact. The wound on the right foot looks slightly smaller but dimensions are measuring the same. We have been using polymen Ag 8/9-Patient back after 2 weeks, wound of the right foot again looking slightly smaller, continuing to use PolyMem silver 8/16; patient has made some minor improvements in wound area on the right foot. Using polymen Ag. The left foot remains healed 8/23; continued slight improvement in dimensions. Rolled edges around the wound edge noted. We have been using polymen Ag. Patient offloading with a surgical shoe and crutches 8/30; no change in surface  area. Thick rolled skin and subcutaneous tissue around the edges debris on the wound surface. We have been using polymen Ag initially with some  improvement however stalled on the last 2 weeks in terms of surface area 9/13; surface area is smaller still using polymen Ag. Thick rolled skin and subcutaneous tissue around the edges requiring debridement. 9/20; again surface area is improved using polymen Ag however each week she develops thick rolled skin and subcutaneous tissue around the wound edges requiring debridement. She is offloading this using crutches 9/27; we've been using PolyMem AG. Wound does not look any different from last week. Raised edges of callus skin and subcutaneous tissue around the wound bed. 10/5; we been using polymen Ag. No ability to put a total contact cast on today in the clinic. Wound is about the same still rolled edges of thick callus around the margins 10/18; we put her in a total contact cast last week things did not go well. Not only does the wound looked worse macerated angry with thick callus and some warmth around it. She had another wound above this and one on the anterior right leg. I am not totally certain why this happened. She has not been systemically unwell 10/25; culture from last week grew Pseudomonas and MRSA. My experience with this MRSA is usually a true infection and Pseudomonas a colonizer I am going to put her on doxycycline today we put gentamicin on the wound surface. Continuing with silver alginate 11/1; she has tolerated her antibiotics. I gave her doxycycline. Was still using gentamicin. The original wound looks about the same thick rolled edges. The cast injury just above it is superficial 11/8; even after an aggressive debridement last week the wound on her medial Charcot foot on the right looks about the same. The surface looks satisfactory however rolled edges of callus thick skin and subcutaneous tissue. She did not do well on 2 separate occasions in a total contact cast. The cast abrasion she has on the medial foot is just above closed. We have been using polymen Ag. She is  attempting to offload this is much as she can with her crutches 11/15; the wound on the Charcot right foot about the same in terms of surface area.. The area above this on the ankle is healed. 11/29; Charcot right foot wound. This is medially. Wound may be slightly smaller however still raised edges of callus and skin around the wound circumference. We have been using polymen Objective Constitutional Patient is hypertensive.. Pulse regular and within target range for patient.Marland Kitchen Respirations regular, non-labored and within target range.. Temperature is normal and within the target range for the patient.Marland Kitchen Appears in no distress. Vitals Time Taken: 1:44 PM, Height: 68 in, Weight: 265 lbs, BMI: 40.3, Temperature: 97.7 F, Pulse: 66 bpm, Respiratory Rate: 18 breaths/min, Blood Pressure: 171/75 mmHg. Cardiovascular Pedal pulses palpable. General Notes: Wound exam; right medial plantar foot. Thick rolled edges of callus and subcutaneous tissue around the wound margin is part for the course for her. I removed this with a #5 curette. Hemostasis with direct pressure. The wound itself may be slightly smaller Integumentary (Hair, Skin) Wound #9 status is Open. Original cause of wound was Blister. The wound is located on the Right,Medial Foot. The wound measures 1.1cm length x 1.1cm width x 0.8cm depth; 0.95cm^2 area and 0.76cm^3 volume. There is Fat Layer (Subcutaneous Tissue) exposed. There is no tunneling or undermining noted. There is a medium amount of serosanguineous drainage noted. The wound margin is  thickened. There is large (67-100%) red, pale granulation within the wound bed. There is a small (1-33%) amount of necrotic tissue within the wound bed including Adherent Slough. 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 There was a Excisional Skin/Subcutaneous Tissue Debridement with a total area of 4 sq cm performed by [name], MD. With the following instrument(s): Curette to remove Viable and Non-Viable tissue/material. Material removed includes Callus, Subcutaneous Tissue, and Slough after achieving pain control using Lidocaine 4% T opical Solution. No specimens were taken. A time out was conducted at 14:19, prior to the start of the procedure. A Minimum amount of bleeding was controlled with Pressure. The procedure was tolerated well with a pain level of 0 throughout and a pain level of 0 following the procedure. Post Debridement Measurements: 1.5cm length x 1.3cm width x 0.4cm depth; 0.613cm^3 volume. Character of Wound/Ulcer Post Debridement is improved. Severity of Tissue Post Debridement is: Fat layer exposed. Post procedure Diagnosis Wound #9: Same as Pre-Procedure Pre-procedure diagnosis of Wound #9 is a Diabetic Wound/Ulcer of the Lower Extremity located on the Right,Medial Foot . There was a Three Layer Compression Therapy Procedure by Shawn Stall, RN. Post procedure Diagnosis Wound #9: Same as Pre-Procedure Plan Follow-up Appointments: Return Appointment in 1 week. Dressing Change Frequency: Do not change entire dressing for one week. Wound Cleansing: May shower with protection. - use cast protector Primary Wound Dressing: Wound #9 Right,Medial Foot: Polymem Silver Secondary Dressing: Wound #9 Right,Medial Foot: ABD pad Zetuvit or Kerramax Other: - felt callous pad Edema Control: 3 Layer Compression System - Right Lower Extremity Avoid standing for long periods of time Elevate legs to the level of the heart or above for 30 minutes daily and/or when  sitting, a frequency of: - throughout the day Exercise regularly Off-Loading: Open toe surgical shoe to: - right foot #1 I continued with the polymen silver. The wound may be slightly smaller. She is doing her levelheaded best to offload this I just wonder whether it is going to be enough. 2. Unfortunately we did not have a good response to 2 trials of a total contact cast on this side. I am not prepared to try that again Electronic Signature(s) Signed: 12/16/2019 4:47:57 PM By: Baltazar Najjar MD Entered By: Baltazar Najjar on 12/16/2019 15:04:04 -------------------------------------------------------------------------------- SuperBill Details Patient Name: Date of Service: [facility], Oneisha J. 12/16/2019 Medical Record Number: [id number] Patient Account Number: [id number] Date of Birth/Sex: Treating RN: Feb 07, 1955 (64 y.o. Ardis Rowan, Lauren Primary Care Provider: MA SO UDI, [place] MA LSA DA T Other Clinician: Referring Provider: Treating Provider/Extender: Abe People, ELHA MA LSA DA T Weeks in Treatment: 46 Diagnosis Coding ICD-10 Codes Code Description E11.621 Type 2 diabetes mellitus with foot ulcer L97.511 Non-pressure chronic ulcer of other part of right foot limited to breakdown of skin E11.42 Type 2 diabetes mellitus with diabetic polyneuropathy M14.671 Charcot's joint, right ankle and foot Facility Procedures CPT4 Code: [id number] Description: 11042 - DEB SUBQ TISSUE 20 SQ CM/< ICD-10 Diagnosis Description L97.511 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

## 2019-12-17 NOTE — Assessment & Plan Note (Signed)
Blood pressure is well controlled at 124/79 today.  Continue lisinopril 10 mg daily

## 2019-12-18 NOTE — Progress Notes (Signed)
[name] ([id number]) Visit Report for 12/16/2019 Arrival Information Details Patient Name: Date of Service: [name] 12/16/2019 1:45 PM Medical Record Number: [id number] Patient Account Number: [id number] Date of Birth/Sex: Treating RN: 10-28-1955 (64 y.o. [name] Primary Care Katilin Raynes: MA SO UDI, [place] MA LSA DA T Other Clinician: Referring Andris Brothers: Treating Jasline Buskirk/Extender: Baltazar Najjar MA SO UDI, ELHA MA LSA DA T Weeks in Treatment: 80 Visit Information History Since Last Visit Added or deleted any medications: No Patient Arrived: Crutches Any new allergies or adverse reactions: No Arrival Time: 13:43 Had a fall or experienced change in No Accompanied By: self activities of daily living that may affect Transfer Assistance: None risk of falls: Patient Identification Verified: Yes Signs or symptoms of abuse/neglect since last visito No Secondary Verification Process Completed: Yes Hospitalized since last visit: No Patient Requires Transmission-Based Precautions: No Implantable device outside of the clinic excluding No Patient Has Alerts: No cellular tissue based products placed in the center since last visit: Has Dressing in Place as Prescribed: Yes Pain Present Now: No Electronic Signature(s) Signed: 12/18/2019 9:59:16 AM By: Karl Ito Entered By: Karl Ito on 12/16/2019 13:44:17 -------------------------------------------------------------------------------- Compression Therapy Details Patient Name: Date of Service: [name], [name] 12/16/2019 1:45 PM Medical Record Number: [id number] Patient Account Number: [id number] Date of Birth/Sex: Treating RN: 09-12-1955 (64 y.o. Ardis Rowan, Lauren Primary Care Aadya Kindler: MA SO UDI, [place] MA LSA DA T Other Clinician: Referring Trevis Eden: Treating Ranier Coach/Extender: Baltazar Najjar MA SO UDI, ELHA MA LSA DA T Weeks in Treatment: 9 Compression Therapy Performed for Wound  Assessment: Wound #9 Right,Medial Foot Performed By: Clinician Shawn Stall, RN Compression Type: Three Layer Post Procedure Diagnosis Same as Pre-procedure Electronic Signature(s) Signed: 12/16/2019 2:25:16 PM By: Fonnie Mu RN Entered By: Fonnie Mu on 12/16/2019 14:25:16 -------------------------------------------------------------------------------- Encounter Discharge Information Details Patient Name: Date of Service: [facility], [name] 12/16/2019 1:45 PM Medical Record Number: [id number] Patient Account Number: [id number] Date of Birth/Sex: Treating RN: 1955/12/15 (63 y.o. [name] Primary Care Kadejah Sandiford: MA SO UDI, [place] MA LSA DA T Other Clinician: Referring Tameshia Bonneville: Treating Laken Rog/Extender: Baltazar Najjar MA SO UDI, ELHA MA LSA DA T Weeks in Treatment: 66 Encounter Discharge Information Items Post Procedure Vitals Discharge Condition: Stable Temperature (F): 97.7 Ambulatory Status: Crutches Pulse (bpm): 66 Discharge Destination: Home Respiratory Rate (breaths/min): 18 Transportation: Private Auto Blood Pressure (mmHg): 171/75 Accompanied By: self Schedule Follow-up Appointment: Yes Clinical Summary of Care: Electronic Signature(s) Signed: 12/16/2019 5:09:36 PM By: Shawn Stall Entered By: Shawn Stall on 12/16/2019 14:59:19 -------------------------------------------------------------------------------- Lower Extremity Assessment Details Patient Name: Date of Service: [name] 12/16/2019 1:45 PM Medical Record Number: [id number] Patient Account Number: [id number] Date of Birth/Sex: Treating RN: [birth year] (64 y.o. [name] Primary Care Alvaro Aungst: MA SO UDI, [place] MA LSA DA T Other Clinician: Referring Preslea Rhodus: Treating Yurani Fettes/Extender: Baltazar Najjar MA SO UDI, ELHA MA LSA DA T Weeks in Treatment: 46 Edema Assessment Assessed: [Left: No] [Right: No] Edema: [Left: N] [Right: o] Calf Left: Right: Point  of Measurement: 43 cm From Medial Instep 33.5 cm Ankle Left: Right: Point of Measurement: 12 cm From Medial Instep 22.3 cm Vascular Assessment Pulses: Dorsalis Pedis Palpable: [Right:Yes] Electronic Signature(s) Signed: 12/16/2019 4:43:25 PM By: Zenaida Deed RN, BSN Entered By: Zenaida Deed on 12/16/2019 13:47:49 -------------------------------------------------------------------------------- Multi Wound Chart Details Patient Name: Date of Service: [name], [name] 12/16/2019 1:45 PM Medical Record Number: [id number] Patient Account Number: [id number] Date of Birth/Sex: Treating RN: 19-Jan-1955 (64  y.o. [name] Primary Care Whitnie Deleon: MA SO UDI, [place] MA LSA DA T Other Clinician: Referring Rodney Wigger: Treating Diana Davenport/Extender: Baltazar Najjar MA SO UDI, ELHA MA LSA DA T Weeks in Treatment: 68 Vital Signs Height(in): 68 Pulse(bpm): 66 Weight(lbs): 265 Blood Pressure(mmHg): 171/75 Body Mass Index(BMI): 40 Temperature(F): 97.7 Respiratory Rate(breaths/min): 18 Photos: [9:No Photos Right, Medial Foot] [N/A:N/A N/A] Wound Location: [9:Blister] [N/A:N/A] Wounding Event: [9:Diabetic Wound/Ulcer of the Lower] [N/A:N/A] Primary Etiology: [9:Extremity Glaucoma, Chronic sinus] [N/A:N/A] Comorbid History: [9:problems/congestion, Anemia, Sleep Apnea, Hypertension, Type II Diabetes, Rheumatoid Arthritis, Neuropathy 06/18/2018] [N/A:N/A] Date Acquired: [9:46] [N/A:N/A] Weeks of Treatment: [9:Open] [N/A:N/A] Wound Status: [9:1.1x1.1x0.8] [N/A:N/A] Measurements L x W x D (cm) [9:0.95] [N/A:N/A] A (cm) : rea [9:0.76] [N/A:N/A] Volume (cm) : [9:91.40%] [N/A:N/A] % Reduction in A [9:rea: 92.30%] [N/A:N/A] % Reduction in Volume: [9:Grade 2] [N/A:N/A] Classification: [9:Medium] [N/A:N/A] Exudate A mount: [9:Serosanguineous] [N/A:N/A] Exudate Type: [9:red, brown] [N/A:N/A] Exudate Color: [9:Thickened] [N/A:N/A] Wound Margin: [9:Large (67-100%)] [N/A:N/A] Granulation A  mount: [9:Red, Pale] [N/A:N/A] Granulation Quality: [9:Small (1-33%)] [N/A:N/A] Necrotic A mount: [9:Fat Layer (Subcutaneous Tissue): Yes N/A] Exposed Structures: [9:Fascia: No Tendon: No Muscle: No Joint: No Bone: No None] [N/A:N/A] Epithelialization: [9:Debridement - Excisional] [N/A:N/A] Debridement: Pre-procedure Verification/Time Out 14:19 [N/A:N/A] Taken: [9:Lidocaine 4% Topical Solution] [N/A:N/A] Pain Control: [9:Callus, Subcutaneous, Slough] [N/A:N/A] Tissue Debrided: [9:Skin/Subcutaneous Tissue] [N/A:N/A] Level: [9:4] [N/A:N/A] Debridement A (sq cm): [9:rea Curette] [N/A:N/A] Instrument: [9:Minimum] [N/A:N/A] Bleeding: [9:Pressure] [N/A:N/A] Hemostasis A chieved: [9:0] [N/A:N/A] Procedural Pain: [9:0] [N/A:N/A] Post Procedural Pain: [9:Procedure was tolerated well] [N/A:N/A] Debridement Treatment Response: [9:1.5x1.3x0.4] [N/A:N/A] Post Debridement Measurements L x W x D (cm) [9:0.613] [N/A:N/A] Post Debridement Volume: (cm) [9:Compression Therapy] [N/A:N/A] Procedures Performed: [9:Debridement] Treatment Notes Wound #9 (Right, Medial Foot) 1. Cleanse With Wound Cleanser Soap and water 2. Periwound Care Moisturizing lotion 3. Primary Dressing Applied Polymem Ag 4. Secondary Dressing ABD Pad Kerramax/Xtrasorb 6. Support Layer Applied 3 layer compression wrap 7. Footwear/Offloading device applied Felt/Foam Notes Polymem Ag primary, Zetuvit and felt secondary Electronic Signature(s) Signed: 12/16/2019 4:47:57 PM By: Baltazar Najjar MD Signed: 12/17/2019 6:12:47 PM By: Zandra Abts RN, BSN Entered By: Baltazar Najjar on 12/16/2019 15:00:03 -------------------------------------------------------------------------------- Multi-Disciplinary Care Plan Details Patient Name: Date of Service: [facility], Kynnedi J. 12/16/2019 1:45 PM Medical Record Number: [id number] Patient Account Number: [id number] Date of Birth/Sex: Treating RN: [birth year] (64 y.o. Ardis Rowan, Lauren Primary Care Yaquelin Langelier: MA SO UDI, [place] MA LSA DA T Other Clinician: Referring Nobel Brar: Treating Navaya Wiatrek/Extender: Baltazar Najjar MA SO UDI, ELHA MA LSA DA T Weeks in Treatment: 27 Active Inactive Wound/Skin Impairment Nursing Diagnoses: Impaired tissue integrity Knowledge deficit related to ulceration/compromised skin integrity Goals: Patient/caregiver will verbalize understanding of skin care regimen Date Initiated: 01/28/2019 Target Resolution Date: 12/27/2019 Goal Status: Active Interventions: Assess patient/caregiver ability to obtain necessary supplies Assess patient/caregiver ability to perform ulcer/skin care regimen upon admission and as needed Assess ulceration(s) every visit Provide education on ulcer and skin care Notes: Electronic Signature(s) Signed: 12/16/2019 2:08:57 PM By: Fonnie Mu RN Entered By: Fonnie Mu on 12/16/2019 14:08:56 -------------------------------------------------------------------------------- Pain Assessment Details Patient Name: Date of Service: [name], [name] 12/16/2019 1:45 PM Medical Record Number: [id number] Patient Account Number: [id number] Date of Birth/Sex: Treating RN: [birth year] (64 y.o. [name] Primary Care Vida Nicol: MA SO UDI, [place] MA LSA DA T Other Clinician: Referring Sayuri Rhames: Treating Lee Kalt/Extender: Baltazar Najjar MA SO UDI, ELHA MA LSA DA T Weeks in Treatment: 102 Active Problems Location of Pain Severity and Description of Pain Patient Has Paino No  Site Locations Pain Management and Medication Current Pain Management: Electronic Signature(s) Signed: 12/17/2019 6:12:47 PM By: Zandra Abts RN, BSN Signed: 12/18/2019 9:59:16 AM By: Karl Ito Entered By: Karl Ito on 12/16/2019 13:44:41 -------------------------------------------------------------------------------- Patient/Caregiver Education Details Patient Name: Date of Service: [facility] 11/29/2021andnbsp1:45 PM Medical Record Number: [id number] Patient Account Number: [id number] Date of Birth/Gender: Treating RN: [birth year] (64 y.o. Toniann Fail Primary Care Physician: MA SO UDI, Judene Companion MA LSA DA T Other Clinician: Referring Physician: Treating Physician/Extender: Baltazar Najjar MA SO UDI, ELHA MA LSA DA T Weeks in Treatment: 82 Education Assessment Education Provided To: Patient Education Topics Provided Wound/Skin Impairment: Methods: Explain/Verbal Responses: Reinforcements needed Electronic Signature(s) Signed: 12/16/2019 5:13:46 PM By: Fonnie Mu RN Entered By: Fonnie Mu on 12/16/2019 14:09:15 -------------------------------------------------------------------------------- Wound Assessment Details Patient Name: Date of Service: [name], [name] 12/16/2019 1:45 PM Medical Record Number: [id number] Patient Account Number: [id number] Date of Birth/Sex: Treating RN: July 10, 1955 (64 y.o. [name] Primary Care Montee Tallman: MA SO UDI, [place] MA LSA DA T Other Clinician: Referring Milka Windholz: Treating Dexter Signor/Extender: Baltazar Najjar MA SO UDI, ELHA MA LSA DA T Weeks in Treatment: 46 Wound Status Wound Number: 9 Primary Diabetic Wound/Ulcer of the Lower Extremity Etiology: Wound Location: Right, Medial Foot Wound Open Wounding Event: Blister Status: Date Acquired: 06/18/2018 Comorbid Glaucoma, Chronic sinus problems/congestion, Anemia, Sleep Weeks Of Treatment: 46 History: Apnea, Hypertension, Type II Diabetes, Rheumatoid Arthritis, Clustered Wound: No Neuropathy Wound Measurements Length: (cm) 1.1 Width: (cm) 1.1 Depth: (cm) 0.8 Area: (cm) 0.95 Volume: (cm) 0.76 % Reduction in Area: 91.4% % Reduction in Volume: 92.3% Epithelialization: None Tunneling: No Undermining: No Wound Description Classification: Grade 2 Wound Margin: Thickened Exudate Amount: Medium Exudate Type: Serosanguineous Exudate  Color: red, brown Foul Odor After Cleansing: No Slough/Fibrino No Wound Bed Granulation Amount: Large (67-100%) Exposed Structure Granulation Quality: Red, Pale Fascia Exposed: No Necrotic Amount: Small (1-33%) Fat Layer (Subcutaneous Tissue) Exposed: Yes Necrotic Quality: Adherent Slough Tendon Exposed: No Muscle Exposed: No Joint Exposed: No Bone Exposed: No Treatment Notes Wound #9 (Right, Medial Foot) 1. Cleanse With Wound Cleanser Soap and water 2. Periwound Care Moisturizing lotion 3. Primary Dressing Applied Polymem Ag 4. Secondary Dressing ABD Pad Kerramax/Xtrasorb 6. Support Layer Applied 3 layer compression wrap 7. Footwear/Offloading device applied Felt/Foam Notes Polymem Ag primary, Zetuvit and felt secondary Electronic Signature(s) Signed: 12/16/2019 4:43:25 PM By: Zenaida Deed RN, BSN Entered By: Zenaida Deed on 12/16/2019 13:50:27 -------------------------------------------------------------------------------- Vitals Details Patient Name: Date of Service: [name], [name] 12/16/2019 1:45 PM Medical Record Number: [id number] Patient Account Number: [id number] Date of Birth/Sex: Treating RN: August 24, 1955 (64 y.o. [name] Primary Care Braelyn Bordonaro: MA SO UDI, [place] MA LSA DA T Other Clinician: Referring Abou Sterkel: Treating Beata Beason/Extender: Baltazar Najjar MA SO UDI, ELHA MA LSA DA T Weeks in Treatment: 54 Vital Signs Time Taken: 13:44 Temperature (F): 97.7 Height (in): 68 Pulse (bpm): 66 Weight (lbs): 265 Respiratory Rate (breaths/min): 18 Body Mass Index (BMI): 40.3 Blood Pressure (mmHg): 171/75 Reference Range: 80 - 120 mg / dl Electronic Signature(s) Signed: 12/18/2019 9:59:16 AM By: Karl Ito Entered By: Karl Ito on 12/16/2019 13:44:35

## 2019-12-19 NOTE — Progress Notes (Signed)
Internal Medicine Clinic Attending  Case discussed with Dr. Masoudi  At the time of the visit.  We reviewed the resident's history and exam and pertinent patient test results.  I agree with the assessment, diagnosis, and plan of care documented in the resident's note.  

## 2019-12-19 NOTE — Addendum Note (Signed)
Addended by: Erlinda Hong T on: 12/19/2019 11:19 AM   Modules accepted: Level of Service

## 2019-12-23 ENCOUNTER — Encounter (HOSPITAL_BASED_OUTPATIENT_CLINIC_OR_DEPARTMENT_OTHER): Payer: Medicare Other | Attending: Internal Medicine | Admitting: Internal Medicine

## 2019-12-23 ENCOUNTER — Other Ambulatory Visit: Payer: Self-pay

## 2019-12-23 DIAGNOSIS — E11621 Type 2 diabetes mellitus with foot ulcer: Secondary | ICD-10-CM | POA: Insufficient documentation

## 2019-12-23 DIAGNOSIS — M14671 Charcot's joint, right ankle and foot: Secondary | ICD-10-CM | POA: Insufficient documentation

## 2019-12-23 DIAGNOSIS — L97511 Non-pressure chronic ulcer of other part of right foot limited to breakdown of skin: Secondary | ICD-10-CM | POA: Diagnosis not present

## 2019-12-23 DIAGNOSIS — E1142 Type 2 diabetes mellitus with diabetic polyneuropathy: Secondary | ICD-10-CM | POA: Diagnosis not present

## 2019-12-24 NOTE — Progress Notes (Signed)
[name], [name] ([id number]) Visit Report for 12/23/2019 HPI Details Patient Name: Date of Service: Sarah, [name] 12/23/2019 2:00 PM Medical Record Number: [id number] Patient Account Number: [id number] Date of Birth/Sex: Treating RN: 1955/04/25 (64 y.o. [name] Primary Care Provider: MA SO UDI, [place] MA LSA DA T Other Clinician: Referring Provider: Treating Provider/Extender: Baltazar Najjar MA SO UDI, ELHA MA LSA DA T Weeks in Treatment: 39 History of Present Illness HPI Description: 64 yrs old bf here for follow up recurrent left charcot foot ulcer. She has DM. She is not a smoker. She states a blister developed 3 months ago at site of previous breakdown from 1 year ago. It was debrided at the podiatrist's office. She went to the ER and then sent here. She denies pain. Xray was negative for osteo. Culture from this clinic negative. 09/08/14 [name] to [name] for Charcot foot. Seen by him and MRI scheduled 09/19/14. Prior silver collagen but this was not ordered last visit, patient has been rinsing with saline alone. Re institute silver collagen every other day. F/u 2 weeks with [name] as Labor Day holiday. Supplies ordered. 09/25/14; this is a patient with a Charcot foot on the left. she has a wound over the plantar aspect of her left calcaneus. She has been seen by [name] of orthopedic surgery and has had an MRI. She is apparently going within the next week or 2 for corrective surgery which will involve an external fixator. I don't have any of the details of this. 10/06/14; The patient is a 64 yrs old bf with a left Charcot foot and ulcer on the plantar. 12/01/14 Returns post surgery with [name]. Has follow up visit later this week with him, currently in facility and NWB over this extremity. States no drainage from foot and no current wound care. On exam callus and scab in place, suspect healed. Has external fixator in place. Will defer to Dr.  [name] for management, ok to place moisturizing cream over scabs and callus and she will call if she requires follow up here. READMISSION 01/28/2019 Patient was in the clinic in 2016 for a wound on the plantar aspect of her left calcaneus. Predominantly cared for by [name] she was referred to [name] and had corrective surgery for the position of her left ankle I believe. She had previously been here in 2015 and then prior to that in 2011 2012 that I do not have these records. More recently she has developed fairly extensive wounds on the right medial foot, left lateral foot and a small area on the right medial great toe. Right medial foot wound has been there for 6 to 7 months, left foot wound for 2 to 3 months and the right great toe only over the last few weeks. She has been followed by [name] at Dr. Laverta [place] office it sounds as though she has been undergoing callus paring and silver nitrate. The patient has been washing these off with soap and water and plan applying dry dressing Past medical history, type 2 diabetes well controlled with a recent hemoglobin A1c of 7 on 11/16, type 2 diabetes with peripheral neuropathy, previous left Charcot joint surgery bilateral Charcot joints currently, stage III chronic renal failure, hypertension, obstructive sleep apnea, history of MRSA and gastroesophageal reflux. 1/18; this is a patient with bilateral Charcot feet. Plain x-rays that I did of both of these wounds that are either at bone or precariously close to bone  were done. On the right foot this showed possible lytic destruction involving the anterior aspect of the talus which may represent a wound osteomyelitis. An MRI was recommended. On the left there was no definite lytic destruction although the wound is deep undermining's and I think probably requires an MRI on this basis in any case 1/25; patient was supposed to have her MRI last Friday of her bilateral feet however they would  not do it because there was silver alginate in her dressings, will replace with calcium alginate today and will see if we can get her done today 2/1; patient did not get her MRIs done last week because of issues with the MRI department receiving orders to do bilateral feet. She has 2 deep wounds in the setting of Charcot feet deformities bilaterally. Both of them at one point had exposed bone although I had trouble demonstrating that today. I am not sure that we can offload these very aggressively as I watched her leave the clinic last week her gait is very narrow and unsteady. 2/15; the MRI of her right foot did not show osteomyelitis potentially osteonecrosis of the talus. 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 The wounds are a large wound on the right medial foot and on the left a substantial wound on the left lateral plantar calcaneus. We have been using silver alginate. Ultimately I think we need to consider a total contact cast on the right while we work through the possibility of osteomyelitis in the left. I watched her walk out with her crutches I have some trepidation about the ability to walk with the cast on her right foot. Nevertheless with her Charcot deformities I do not think there is a way to heal these short of offloading them aggressively. 2/22; the culture of the bone in the left foot that I did last week showed a few Citrobacter Koseri as well as some streptococcal species. In my attempted bone for pathology did not actually yield a lot of bone the pathologist did not identify any osteomyelitis. 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 Is promised today and aggressive debridement of the right foot and a total contact cast which surprisingly she seemed to tolerate quite well 03/13/2019  upon evaluation today patient appears to be doing well with her total contact cast she is here for the first cast change. She had no areas of rubbing or any other complications at this point. Overall things are doing quite well. 2/26; the patient was kindly seen by Dr. Megan Salon of infectious disease on 2/25. She is now on IV vancomycin PICC line placement in 2 days and oral Flagyl. This was in response to her MRI showing osteomyelitis of the left calcaneus concerning for osteomyelitis. We have been putting her right leg in a total contact cast. Our nurses report drainage. She is tolerating the total contact cast well. 3/4; the patient is started her IV antibiotics and is taking IV vancomycin and oral Flagyl. She appears to be tolerating these both well. This is for osteomyelitis involving the left calcaneus. The area on the right in the setting of bilateral Charcot deformities did not have any bone infection on MRI. We put a total contact cast on her with silver alginate as the primary dressing and she returns today in follow-up. Per  our intake nurse there was too much drainage to leave this on all week therefore we will bring her back for an early change 3/8; follow-up total contact cast she is still having a lot of drainage probably too much drainage from the right foot wound will week with the same cast. She will be back on Thursday. The area on the left looks somewhat better 3/11; patient here for a total contact cast change. 3/15; patient came in for wound care evaluation. She is still on IV vancomycin and oral Flagyl for osteomyelitis in the left foot. The area on the right foot we are putting in a total contact cast. 3/18 for total contact cast change on the right foot 3/22; the patient was here for wound review today. The area on the right foot is slightly smaller in terms of surface area. However the surface of the wound is very gritty and hyper granulated. More problematically the area on  the plantar left foot has increased depth indeed in the center of this it probes to bone. We have been using Hydrofera Blue in both areas. She is on antibiotics as prescribed by infectious disease IV vancomycin and oral Flagyl 3/25; the patient is in for total contact cast change. Our intake nurse was concerned about the amount of drainage which soaked right to the multiple layers we are putting on this. Wondered about options. The wound bed actually looks as healthy as this is looked although there may not be a lot of change in dimensions things are looking a lot better. If we are going to heal this woman's wound which is in the middle of her right Charcot foot this is going to need to be offloaded. There are not many alternatives 3/29; still not a lot of improvement although the surface of the right wound looks better. We have been using Hydrofera Blue 04/17/2019 upon evaluation today patient appears to be doing well with a total contact cast. With actually having to change this twice a week. She saw Dr. Dellia Nims for wound care earlier in the week this is for the cast change today. That is due to the fact that she has significant drainage. Overall the wound is been looking excellent however. 4/5; we continue to put a total contact cast on this patient with Hydrofera Blue. Still a lot of drainage which precludes a simple weekly change or changing this twice a week. 4/8; total contact cast changed today. Apparently the wound looks better per our intake nurse 4/12; patient here for wound care evaluation. Wounds on the right foot have not changed in dimension none about a month left foot looks similar. Apparently her antibiotics that we are giving her for osteomyelitis in the left foot complete on Wednesday. We have been using Hydrofera Blue on the right foot under a total contact cast. Nurses report greenish drainage although I think this may be discoloration from the [facility] 4/15; back for cast  change wounds look quite good although still moderate amount of drainage. T contact cast reapplied otal 4/19;. Wound evaluation. Wound on the right foot is smaller surface looks healthy. There is still the divot in the middle part of this wound but I could not feel any bone. Area on the left foot also looks better She has completed her antibiotics but still has the PICC line in. 4/23; patient comes back for a total contact cast change this was done in the standard fashion no issues 4/26; wound measures slightly larger on the right  foot which is disappointing. She sees Dr. Megan Salon with regards to her antibiotics on Wednesday. I believe she is on vancomycin IV and oral Flagyl. 4/29; in for her routine total contact cast change. She saw Dr. Megan Salon this morning. He is asking about antibiotic continuation I will be in touch with him. I did not look at her wound today 5/3; patient saw Dr. Megan Salon on 4/28. I did send him a note asking about the continuation of perhaps an oral regimen. I have not heard back. She still has an elevated sedimentation rate at 81. Her wounds are smaller. 5/6; in for her total contact cast change. We did not look at the wound today. She has seen Dr. Megan Salon and is now on oral Keflex and Flagyl. She seems to be tolerating these well 5/10; she had her PICC line removed. The wound on the right foot after some initial improvement has not made any improvement even with the total contact cast. Still having a lot of drainage. Likewise the area on the left foot really is not any better either 5/14; in for a cast change today. We will look at the right foot on Monday. If this is not making any progress I am going to try to cast the left foot. 5/17; the wound on the right foot is absolutely no different. I am going to put ointment on this area and change the cast of the left foot. We have been using silver alginate there 5/21; apparently the area on the right foot was better per our  intake nurse although I did not see it. We did a standard total contact cast change on the left foot we have been applying silver alginate to the wound here. 06/10/19-Area on the right foot per dimensions slightly better, the left foot has still some depth applying silver alginate to the wound with TCC 06/13/19-Patient here for Casting to Left foot 6/1; patient had the total contact cast were placed on the left foot. Noted some swelling in her right leg although the dimensions of the wound on the right foot are better. We have been using PolyMem on the right and silver alginate on the left under the cast 6/7; we continue to make nice progress in both her plantar feet wound. We are putting the total contact cast on the left. Surprisingly in spite of this the area on the right medial foot in the middle of her Charcot deformities actually is making progress as well 07/01/2019 upon evaluation today patient actually appears to be doing quite well with regard to her wounds on the feet compared to when I saw her last that has been quite some time. With that being said I do believe the total contact cast has been beneficial in this left foot and she has a lot of signs of improvement she does have some callus overhanging which is caused a little bit of a ledge and an issue here. I do believe that we may be able to do something to help in that regard. Fortunately otherwise the wound seems to be progressing quite nicely. 6/21; the patient's area on the left foot that we are currently casting is just about closed. The area on the right medial foot has a healthy looking surface but may be not changed as much from last week in terms of surface area. We are using silver alginate on the right and polymen Ag under the cast 6/28; only a small open area on the left foot remains. Using silver alginate  under a total contact cast. She has a diabetic shoe I have asked her to bring that for next week where this may be closed.  The area on the right foot with a Charcot deformity is also measuring slightly smaller. We are using PolyMem Ag here 7/6; left foot is totally closed. She does not require a total contact cast. She will graduate to her own diabetic shoe. The area on the right foot with a Charcot deformity measuring about the same. We have been using polymen under compression wraps. She did not do well in this area with a total contact cast 7/13; left foot remains intact. She is skillfully covering this area with foam and using her diabetic shoe. Slight area improvement of the right foot wound. Still rolled senescent looking edges. We have been using polymen 7/19; left foot remains intact. Wound measuring slightly smaller on the right foot in the middle of the Charcot deformity. We have been using PolyMem Ag 7/26; per the patient left foot remains intact. The wound on the right foot looks slightly smaller but dimensions are measuring the same. We have been using polymen Ag 8/9-Patient back after 2 weeks, wound of the right foot again looking slightly smaller, continuing to use PolyMem silver 8/16; patient has made some minor improvements in wound area on the right foot. Using polymen Ag. The left foot remains healed 8/23; continued slight improvement in dimensions. Rolled edges around the wound edge noted. We have been using polymen Ag. Patient offloading with a surgical shoe and crutches 8/30; no change in surface area. Thick rolled skin and subcutaneous tissue around the edges debris on the wound surface. We have been using polymen Ag initially with some improvement however stalled on the last 2 weeks in terms of surface area 9/13; surface area is smaller still using polymen Ag. Thick rolled skin and subcutaneous tissue around the edges requiring debridement. 9/20; again surface area is improved using polymen Ag however each week she develops thick rolled skin and subcutaneous tissue around the wound  edges requiring debridement. She is offloading this using crutches 9/27; we've been using PolyMem AG. Wound does not look any different from last week. Raised edges of callus skin and subcutaneous tissue around the wound bed. 10/5; we been using polymen Ag. No ability to put a total contact cast on today in the clinic. Wound is about the same still rolled edges of thick callus around the margins 10/18; we put her in a total contact cast last week things did not go well. Not only does the wound looked worse macerated angry with thick callus and some warmth around it. She had another wound above this and one on the anterior right leg. I am not totally certain why this happened. She has not been systemically unwell 10/25; culture from last week grew Pseudomonas and MRSA. My experience with this MRSA is usually a true infection and Pseudomonas a colonizer I am going to put her on doxycycline today we put gentamicin on the wound surface. Continuing with silver alginate 11/1; she has tolerated her antibiotics. I gave her doxycycline. Was still using gentamicin. The original wound looks about the same thick rolled edges. The cast injury just above it is superficial 11/8; even after an aggressive debridement last week the wound on her medial Charcot foot on the right looks about the same. The surface looks satisfactory however rolled edges of callus thick skin and subcutaneous tissue. She did not do well on 2 separate occasions in a  total contact cast. The cast abrasion she has on the medial foot is just above closed. We have been using polymen Ag. She is attempting to offload this is much as she can with her crutches 11/15; the wound on the Charcot right foot about the same in terms of surface area.. The area above this on the ankle is healed. 11/29; Charcot right foot wound. This is medially. Wound may be slightly smaller however still raised edges of callus and skin around the wound circumference. We  have been using polymen 12/6; Charcot right foot wound. This is medially on the plantar aspect. I did not debride this today wound looks about the same in a week she is reappeared with raised edges of callus and thick skin around the wound. We have been using polymen no real improvement Electronic Signature(s) Signed: 12/24/2019 9:01:39 AM By: Baltazar Najjar MD Entered By: Baltazar Najjar on 12/23/2019 15:45:17 -------------------------------------------------------------------------------- Physical Exam Details Patient Name: Date of Service: [name], Ellayna J. 12/23/2019 2:00 PM Medical Record Number: [id number] Patient Account Number: [id number] Date of Birth/Sex: Treating RN: 1955-09-10 (64 y.o. [name] Primary Care Provider: MA SO UDI, Judene Companion MA LSA DA T Other Clinician: Referring Provider: Treating Provider/Extender: Baltazar Najjar MA SO UDI, ELHA MA LSA DA T Weeks in Treatment: 83 Cardiovascular Needle pulses are palpable. Integumentary (Hair, Skin) No erythema around the wound. Notes Wound exam; right medial plantar foot. Again thick skin callus around the wound margins. None no real improvement than what I did last week which was extensive. The wound surface actually looks fairly healthy however Electronic Signature(s) Signed: 12/24/2019 9:01:39 AM By: Baltazar Najjar MD Entered By: Baltazar Najjar on 12/23/2019 15:46:15 -------------------------------------------------------------------------------- Physician Orders Details Patient Name: Date of Service: [name], Anaisha J. 12/23/2019 2:00 PM Medical Record Number: [id number] Patient Account Number: [id number] Date of Birth/Sex: Treating RN: 12-21-1955 (64 y.o. 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 Bathing/ Shower/ Hygiene May shower with protection but do not get wound dressing(s) wet. Edema Control - Lymphedema / SCD / Other Elevate legs to the level of the heart or above for 30 minutes daily and/or when sitting, a frequency of: - throughout the day Avoid standing for long periods of time. Patient to wear own compression stockings every day. Wound Treatment Wound #9 - Foot Wound Laterality: Right, Medial Cleanser: Wound Cleanser 1 x Per Week/Other:wound center to change weekly Discharge Instructions: Cleanse the wound with wound cleanser prior to applying a clean dressing using gauze sponges, not tissue or cotton balls. Peri-Wound Care: Sween Lotion (Moisturizing lotion) 1 x Per Week/Other:wound center to change weekly Discharge Instructions: Apply moisturizing lotion as directed Prim Dressing: Promogran Prisma Matrix, 4.34 (sq in) (silver collagen) ary 1 x Per Week/Other:wound center to change weekly Discharge Instructions: Moisten with hydrogel Secondary Dressing: ABD Pad, 5x9 1 x Per Week/Other:wound center to change weekly Discharge Instructions: Apply over primary dressing as directed. Secondary Dressing: Zetuvit Plus 4x8 in 1 x Per Week/Other:wound center to change weekly Discharge Instructions: Apply over  primary dressing as directed. Secondary Dressing: Felt 2.5 yds x 5.5 in 1 x Per Week/Other:wound center to change weekly Discharge Instructions: Apply over primary dressing as directed. Compression Wrap: ThreePress (3 layer compression wrap) 1 x Per Week/Other:wound center to change weekly Discharge Instructions: Apply three layer  compression as directed. Electronic Signature(s) Signed: 12/23/2019 5:36:08 PM By: Zandra Abts RN, BSN Signed: 12/24/2019 9:01:39 AM By: Baltazar Najjar MD Entered By: Zandra Abts on 12/23/2019 15:32:47 -------------------------------------------------------------------------------- Problem List Details Patient Name: Date of Service: [name], Nessie J. 12/23/2019 2:00 PM Medical Record Number: [id number] Patient Account Number: [id number] Date of Birth/Sex: Treating RN: 03/31/1955 (64 y.o. [name] Primary Care Provider: MA SO UDI, Judene Companion MA LSA DA T Other Clinician: Referring Provider: Treating Provider/Extender: Abe People, ELHA MA LSA DA T Weeks in Treatment: 72 Active Problems ICD-10 Encounter Code Description Active Date MDM Diagnosis E11.621 Type 2 diabetes mellitus with foot ulcer 01/28/2019 No Yes L97.511 Non-pressure chronic ulcer of other part of right foot limited to breakdown of 01/28/2019 No Yes skin E11.42 Type 2 diabetes mellitus with diabetic polyneuropathy 01/28/2019 No Yes M14.671 Charcot's joint, right ankle and foot 01/28/2019 No Yes Inactive Problems ICD-10 Code Description Active Date Inactive Date L97.524 Non-pressure chronic ulcer of other part of left foot with necrosis of bone 01/28/2019 01/28/2019 L97.514 Non-pressure chronic ulcer of other part of right foot with necrosis of bone 01/28/2019 01/28/2019 K44.010 Charcot's joint, left ankle and foot 01/28/2019 01/28/2019 U72.536 Other chronic osteomyelitis, left ankle and foot 04/08/2019 04/08/2019 Resolved Problems Electronic Signature(s) Signed: 12/24/2019 9:01:39 AM By: Baltazar Najjar MD Entered By: Baltazar Najjar on 12/23/2019 15:44:05 -------------------------------------------------------------------------------- Progress Note Details Patient Name: Date of Service: [name], Sanyla J. 12/23/2019 2:00 PM Medical Record Number: [id number] Patient Account Number: [id number] Date of  Birth/Sex: Treating RN: [birth year] (64 y.o. [name] Primary Care Provider: MA SO UDI, [place] MA LSA DA T Other Clinician: Referring Provider: Treating Provider/Extender: Baltazar Najjar MA SO UDI, ELHA MA LSA DA T Weeks in Treatment: 36 Subjective History of Present Illness (HPI) 64 yrs old bf here for follow up recurrent left charcot foot ulcer. She has DM. She is not a smoker. She states a blister developed 3 months ago at site of previous breakdown from 1 year ago. It was debrided at the podiatrist's office. She went to the ER and then sent here. She denies pain. Xray was negative for osteo. Culture from this clinic negative. 09/08/14 Referred by [name] to [name] for Charcot foot. Seen by him and MRI scheduled 09/19/14. Prior silver collagen but this was not ordered last visit, patient has been rinsing with saline alone. Re institute silver collagen every other day. F/u 2 weeks with [name] as Labor Day holiday. Supplies ordered. 09/25/14; this is a patient with a Charcot foot on the left. she has a wound over the plantar aspect of her left calcaneus. She has been seen by [name] of orthopedic surgery and has had an MRI. She is apparently going within the next week or 2 for corrective surgery which will involve an external fixator. I don't have any of the details of this. 10/06/14; The patient is a 64 yrs old bf with a left Charcot foot and ulcer on the plantar. 12/01/14 Returns post surgery with [name]. Has follow up visit later this week with him, currently in facility and NWB over this extremity. States no drainage from foot and no current wound care. On exam callus  and scab in place, suspect healed. Has external fixator in place. Will defer to [name] for management, ok to place moisturizing cream over scabs and callus and she will call if she requires follow up here. READMISSION 01/28/2019 Patient was in the clinic in 2016 for a wound on the plantar aspect of  her left calcaneus. Predominantly cared for by [name] she was referred to [name] and had corrective surgery for the position of her left ankle I believe. She had previously been here in 2015 and then prior to that in 2011 2012 that I do not have these records. More recently she has developed fairly extensive wounds on the right medial foot, left lateral foot and a small area on the right medial great toe. Right medial foot wound has been there for 6 to 7 months, left foot wound for 2 to 3 months and the right great toe only over the last few weeks. She has been followed by [name] at Dr. Laverta [place] office it sounds as though she has been undergoing callus paring and silver nitrate. The patient has been washing these off with soap and water and plan applying dry dressing Past medical history, type 2 diabetes well controlled with a recent hemoglobin A1c of 7 on 11/16, type 2 diabetes with peripheral neuropathy, previous left Charcot joint surgery bilateral Charcot joints currently, stage III chronic renal failure, hypertension, obstructive sleep apnea, history of MRSA and gastroesophageal reflux. 1/18; this is a patient with bilateral Charcot feet. Plain x-rays that I did of both of these wounds that are either at bone or precariously close to bone were done. On the right foot this showed possible lytic destruction involving the anterior aspect of the talus which may represent a wound osteomyelitis. An MRI was recommended. On the left there was no definite lytic destruction although the wound is deep undermining's and I think probably requires an MRI on this basis in any case 1/25; patient was supposed to have her MRI last Friday of her bilateral feet however they would not do it because there was silver alginate in her dressings, will replace with calcium alginate today and will see if we can get her done today 2/1; patient did not get her MRIs done last week because of issues with the  MRI department receiving orders to do bilateral feet. She has 2 deep wounds in the setting of Charcot feet deformities bilaterally. Both of them at one point had exposed bone although I had trouble demonstrating that today. I am not sure that we can offload these very aggressively as I watched her leave the clinic last week her gait is very narrow and unsteady. 2/15; the MRI of her right foot did not show osteomyelitis potentially osteonecrosis of the talus. 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 The wounds are a large wound on the right medial foot and on the left a substantial wound on the left lateral plantar calcaneus. We have been using silver alginate. Ultimately I think we need to consider a total contact cast on the right while we work through the possibility of osteomyelitis in the left. I watched her walk out with her crutches I have some trepidation about the ability to walk with the cast on her right foot. Nevertheless with her Charcot deformities I do not think there is a way to heal these short of offloading them aggressively. 2/22; the culture of the bone in the left foot that I  did last week showed a few Citrobacter Koseri as well as some streptococcal species. In my attempted bone for pathology did not actually yield a lot of bone the pathologist did not identify any osteomyelitis. 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 Is promised today and aggressive debridement of the right foot and a total contact cast which surprisingly she seemed to tolerate quite well 03/13/2019 upon evaluation today patient appears to be doing well with her total contact cast she is here for the first cast change. She had no areas of rubbing or any other complications at this point. Overall things are doing quite  well. 2/26; the patient was kindly seen by Dr. Orvan Falconer of infectious disease on 2/25. She is now on IV vancomycin PICC line placement in 2 days and oral Flagyl. This was in response to her MRI showing osteomyelitis of the left calcaneus concerning for osteomyelitis. We have been putting her right leg in a total contact cast. Our nurses report drainage. She is tolerating the total contact cast well. 3/4; the patient is started her IV antibiotics and is taking IV vancomycin and oral Flagyl. She appears to be tolerating these both well. This is for osteomyelitis involving the left calcaneus. The area on the right in the setting of bilateral Charcot deformities did not have any bone infection on MRI. We put a total contact cast on her with silver alginate as the primary dressing and she returns today in follow-up. Per our intake nurse there was too much drainage to leave this on all week therefore we will bring her back for an early change 3/8; follow-up total contact cast she is still having a lot of drainage probably too much drainage from the right foot wound will week with the same cast. She will be back on Thursday. The area on the left looks somewhat better 3/11; patient here for a total contact cast change. 3/15; patient came in for wound care evaluation. She is still on IV vancomycin and oral Flagyl for osteomyelitis in the left foot. The area on the right foot we are putting in a total contact cast. 3/18 for total contact cast change on the right foot 3/22; the patient was here for wound review today. The area on the right foot is slightly smaller in terms of surface area. However the surface of the wound is very gritty and hyper granulated. More problematically the area on the plantar left foot has increased depth indeed in the center of this it probes to bone. We have been using Hydrofera Blue in both areas. She is on antibiotics as prescribed by infectious disease IV vancomycin and oral  Flagyl 3/25; the patient is in for total contact cast change. Our intake nurse was concerned about the amount of drainage which soaked right to the multiple layers we are putting on this. Wondered about options. The wound bed actually looks as healthy as this is looked although there may not be a lot of change in dimensions things are looking a lot better. If we are going to heal this woman's wound which is in the middle of her right Charcot foot this is going to need to be offloaded. There are not many alternatives 3/29; still not a lot of improvement although the surface of the right wound looks better. We have been using Hydrofera Blue 04/17/2019 upon  evaluation today patient appears to be doing well with a total contact cast. With actually having to change this twice a week. She saw [name] for wound care earlier in the week this is for the cast change today. That is due to the fact that she has significant drainage. Overall the wound is been looking excellent however. 4/5; we continue to put a total contact cast on this patient with Hydrofera Blue. Still a lot of drainage which precludes a simple weekly change or changing this twice a week. 4/8; total contact cast changed today. Apparently the wound looks better per our intake nurse 4/12; patient here for wound care evaluation. Wounds on the right foot have not changed in dimension none about a month left foot looks similar. Apparently her antibiotics that we are giving her for osteomyelitis in the left foot complete on Wednesday. We have been using Hydrofera Blue on the right foot under a total contact cast. Nurses report greenish drainage although I think this may be discoloration from the [facility] 4/15; back for cast change wounds look quite good although still moderate amount of drainage. T contact cast reapplied otal 4/19;. Wound evaluation. Wound on the right foot is smaller surface looks healthy. There is still the divot in the  middle part of this wound but I could not feel any bone. Area on the left foot also looks better She has completed her antibiotics but still has the PICC line in. 4/23; patient comes back for a total contact cast change this was done in the standard fashion no issues 4/26; wound measures slightly larger on the right foot which is disappointing. She sees Dr. Orvan Falconer with regards to her antibiotics on Wednesday. I believe she is on vancomycin IV and oral Flagyl. 4/29; in for her routine total contact cast change. She saw Dr. Orvan Falconer this morning. He is asking about antibiotic continuation I will be in touch with him. I did not look at her wound today 5/3; patient saw Dr. Orvan Falconer on 4/28. I did send him a note asking about the continuation of perhaps an oral regimen. I have not heard back. She still has an elevated sedimentation rate at 81. Her wounds are smaller. 5/6; in for her total contact cast change. We did not look at the wound today. She has seen Dr. Orvan Falconer and is now on oral Keflex and Flagyl. She seems to be tolerating these well 5/10; she had her PICC line removed. The wound on the right foot after some initial improvement has not made any improvement even with the total contact cast. Still having a lot of drainage. Likewise the area on the left foot really is not any better either 5/14; in for a cast change today. We will look at the right foot on Monday. If this is not making any progress I am going to try to cast the left foot. 5/17; the wound on the right foot is absolutely no different. I am going to put ointment on this area and change the cast of the left foot. We have been using silver alginate there 5/21; apparently the area on the right foot was better per our intake nurse although I did not see it. We did a standard total contact cast change on the left foot we have been applying silver alginate to the wound here. 06/10/19-Area on the right foot per dimensions slightly  better, the left foot has still some depth applying silver alginate to the wound with TCC 06/13/19-Patient here for  Casting to Left foot 6/1; patient had the total contact cast were placed on the left foot. Noted some swelling in her right leg although the dimensions of the wound on the right foot are better. We have been using PolyMem on the right and silver alginate on the left under the cast 6/7; we continue to make nice progress in both her plantar feet wound. We are putting the total contact cast on the left. Surprisingly in spite of this the area on the right medial foot in the middle of her Charcot deformities actually is making progress as well 07/01/2019 upon evaluation today patient actually appears to be doing quite well with regard to her wounds on the feet compared to when I saw her last that has been quite some time. With that being said I do believe the total contact cast has been beneficial in this left foot and she has a lot of signs of improvement she does have some callus overhanging which is caused a little bit of a ledge and an issue here. I do believe that we may be able to do something to help in that regard. Fortunately otherwise the wound seems to be progressing quite nicely. 6/21; the patient's area on the left foot that we are currently casting is just about closed. The area on the right medial foot has a healthy looking surface but may be not changed as much from last week in terms of surface area. We are using silver alginate on the right and polymen Ag under the cast 6/28; only a small open area on the left foot remains. Using silver alginate under a total contact cast. She has a diabetic shoe I have asked her to bring that for next week where this may be closed. The area on the right foot with a Charcot deformity is also measuring slightly smaller. We are using PolyMem Ag here 7/6; left foot is totally closed. She does not require a total contact cast. She will graduate to  her own diabetic shoe. ooThe area on the right foot with a Charcot deformity measuring about the same. We have been using polymen under compression wraps. She did not do well in this area with a total contact cast 7/13; left foot remains intact. She is skillfully covering this area with foam and using her diabetic shoe. Slight area improvement of the right foot wound. Still rolled senescent looking edges. We have been using polymen 7/19; left foot remains intact. Wound measuring slightly smaller on the right foot in the middle of the Charcot deformity. We have been using PolyMem Ag 7/26; per the patient left foot remains intact. The wound on the right foot looks slightly smaller but dimensions are measuring the same. We have been using polymen Ag 8/9-Patient back after 2 weeks, wound of the right foot again looking slightly smaller, continuing to use PolyMem silver 8/16; patient has made some minor improvements in wound area on the right foot. Using polymen Ag. The left foot remains healed 8/23; continued slight improvement in dimensions. Rolled edges around the wound edge noted. We have been using polymen Ag. Patient offloading with a surgical shoe and crutches 8/30; no change in surface area. Thick rolled skin and subcutaneous tissue around the edges debris on the wound surface. We have been using polymen Ag initially with some improvement however stalled on the last 2 weeks in terms of surface area 9/13; surface area is smaller still using polymen Ag. Thick rolled skin and subcutaneous tissue around the  edges requiring debridement. 9/20; again surface area is improved using polymen Ag however each week she develops thick rolled skin and subcutaneous tissue around the wound edges requiring debridement. She is offloading this using crutches 9/27; we've been using PolyMem AG. Wound does not look any different from last week. Raised edges of callus skin and subcutaneous tissue around the wound  bed. 10/5; we been using polymen Ag. No ability to put a total contact cast on today in the clinic. Wound is about the same still rolled edges of thick callus around the margins 10/18; we put her in a total contact cast last week things did not go well. Not only does the wound looked worse macerated angry with thick callus and some warmth around it. She had another wound above this and one on the anterior right leg. I am not totally certain why this happened. She has not been systemically unwell 10/25; culture from last week grew Pseudomonas and MRSA. My experience with this MRSA is usually a true infection and Pseudomonas a colonizer I am going to put her on doxycycline today we put gentamicin on the wound surface. Continuing with silver alginate 11/1; she has tolerated her antibiotics. I gave her doxycycline. Was still using gentamicin. The original wound looks about the same thick rolled edges. The cast injury just above it is superficial 11/8; even after an aggressive debridement last week the wound on her medial Charcot foot on the right looks about the same. The surface looks satisfactory however rolled edges of callus thick skin and subcutaneous tissue. She did not do well on 2 separate occasions in a total contact cast. The cast abrasion she has on the medial foot is just above closed. We have been using polymen Ag. She is attempting to offload this is much as she can with her crutches 11/15; the wound on the Charcot right foot about the same in terms of surface area.. The area above this on the ankle is healed. 11/29; Charcot right foot wound. This is medially. Wound may be slightly smaller however still raised edges of callus and skin around the wound circumference. We have been using polymen 12/6; Charcot right foot wound. This is medially on the plantar aspect. I did not debride this today wound looks about the same in a week she is reappeared with raised edges of callus and thick skin  around the wound. We have been using polymen no real improvement Objective Constitutional Vitals Time Taken: 2:27 PM, Height: 68 in, Weight: 265 lbs, BMI: 40.3, Temperature: 98.1 F, Pulse: 72 bpm, Respiratory Rate: 18 breaths/min, Blood Pressure: 120/69 mmHg. Cardiovascular Needle pulses are palpable. General Notes: Wound exam; right medial plantar foot. Again thick skin callus around the wound margins. None no real improvement than what I did last week which was extensive. The wound surface actually looks fairly healthy however Integumentary (Hair, Skin) No erythema around the wound. Wound #9 status is Open. Original cause of wound was Blister. The wound is located on the Right,Medial Foot. The wound measures 1.3cm length x 1.2cm width x 0.8cm depth; 1.225cm^2 area and 0.98cm^3 volume. 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 There was a Three Layer Compression Therapy Procedure  by Zandra Abts, RN. Post procedure Diagnosis Wound #9: Same as Pre-Procedure Plan Follow-up Appointments: Return Appointment in 1 week. Bathing/ Shower/ Hygiene: May shower with protection but do not get wound dressing(s) wet. Edema Control - Lymphedema / SCD / Other: Elevate legs to the level of the heart or above for 30 minutes daily and/or when sitting, a frequency of: - throughout the day Avoid standing for long periods of time. Patient to wear own compression stockings every day. WOUND #9: - Foot Wound Laterality: Right, Medial Cleanser: Wound Cleanser 1 x Per Week/Other:wound center to change weekly Discharge Instructions: Cleanse the wound with wound  cleanser prior to applying a clean dressing using gauze sponges, not tissue or cotton balls. Peri-Wound Care: Sween Lotion (Moisturizing lotion) 1 x Per Week/Other:wound center to change weekly Discharge Instructions: Apply moisturizing lotion as directed Prim Dressing: Promogran Prisma Matrix, 4.34 (sq in) (silver collagen) 1 x Per Week/Other:wound center to change weekly ary Discharge Instructions: Moisten with hydrogel Secondary Dressing: ABD Pad, 5x9 1 x Per Week/Other:wound center to change weekly Discharge Instructions: Apply over primary dressing as directed. Secondary Dressing: Zetuvit Plus 4x8 in 1 x Per Week/Other:wound center to change weekly Discharge Instructions: Apply over primary dressing as directed. Secondary Dressing: Felt 2.5 yds x 5.5 in 1 x Per Week/Other:wound center to change weekly Discharge Instructions: Apply over primary dressing as directed. Com pression Wrap: ThreePress (3 layer compression wrap) 1 x Per Week/Other:wound center to change weekly Discharge Instructions: Apply three layer compression as directed. #1 change the primary dressing to silver collagen 2. Hoping to get this to fill-in some although I think this is a pressure related issue Electronic Signature(s) Signed: 12/24/2019 9:01:39 AM By: Baltazar Najjar MD Entered By: Baltazar Najjar on 12/23/2019 15:47:06 -------------------------------------------------------------------------------- SuperBill Details Patient Name: Date of Service: [facility], [name] 12/23/2019 Medical Record Number: [id number] Patient Account Number: [id number] Date of Birth/Sex: Treating RN: May 21, 1955 (64 y.o. [name] Primary Care Provider: MA SO UDI, Hardy MA LSA DA T Other Clinician: Referring Provider: Treating Provider/Extender: Abe People, ELHA MA LSA DA T Weeks in Treatment: 47 Diagnosis Coding ICD-10 Codes Code Description E11.621 Type 2 diabetes mellitus with foot ulcer L97.511  Non-pressure chronic ulcer of other part of right foot limited to breakdown of skin E11.42 Type 2 diabetes mellitus with diabetic polyneuropathy M14.671 Charcot's joint, right ankle and foot Facility Procedures CPT4 Code: [id number] Description: (Facility Use Only) [id number] - APPLY MULTLAY COMPRS LWR RT LEG Modifier: Quantity: 1 Physician Procedures : CPT4 Code Description Modifier [id number] - WC PHYS LEVEL 3 - EST PT ICD-10 Diagnosis Description E11.621 Type 2 diabetes mellitus with foot ulcer L97.511 Non-pressure chronic ulcer of other part of right foot limited to breakdown of skin Quantity: 1 Electronic Signature(s) Signed: 12/23/2019 5:36:08 PM By: Zandra Abts RN, BSN Signed: 12/24/2019 9:01:39 AM By: Baltazar Najjar MD Entered By: Zandra Abts on 12/23/2019 17:29:00

## 2019-12-24 NOTE — Progress Notes (Signed)
SUTTON, BOSSARD ([id number]) Visit Report for 12/23/2019 Arrival Information Details Patient Name: Date of Service: [name], [name] 12/23/2019 2:00 PM Medical Record Number: [id number] Patient Account Number: [id number] Date of Birth/Sex: Treating RN: 1955-05-11 (64 y.o. [name] Primary Care Lark Langenfeld: MA SO UDI, [place] MA LSA DA T Other Clinician: Referring Breindy Meadow: Treating Jamani Eley/Extender: Baltazar Najjar MA SO UDI, ELHA MA LSA DA T Weeks in Treatment: 7 Visit Information History Since Last Visit Added or deleted any medications: No Patient Arrived: Crutches Any new allergies or adverse reactions: No Arrival Time: 14:27 Had a fall or experienced change in No Accompanied By: self activities of daily living that may affect Transfer Assistance: None risk of falls: Patient Identification Verified: Yes Signs or symptoms of abuse/neglect since last visito No Secondary Verification Process Completed: Yes Hospitalized since last visit: No Patient Requires Transmission-Based Precautions: No Implantable device outside of the clinic excluding No Patient Has Alerts: No cellular tissue based products placed in the center since last visit: Has Dressing in Place as Prescribed: Yes Pain Present Now: No Electronic Signature(s) Signed: 12/24/2019 1:53:43 PM By: Karl Ito Entered By: Karl Ito on 12/23/2019 14:27:31 -------------------------------------------------------------------------------- Compression Therapy Details Patient Name: Date of Service: [name], [name] 12/23/2019 2:00 PM Medical Record Number: [id number] Patient Account Number: [id number] Date of Birth/Sex: Treating RN: 1955-12-09 (64 y.o. [name] Primary Care Evalise Abruzzese: MA SO UDI, Judene Companion MA LSA DA T Other Clinician: Referring Olando Willems: Treating Kabao Leite/Extender: Baltazar Najjar MA SO UDI, ELHA MA LSA DA T Weeks in Treatment: 54 Compression Therapy Performed for Wound Assessment:  Wound #9 Right,Medial Foot Performed By: Clinician Zandra Abts, RN Compression Type: Three Layer Post Procedure Diagnosis Same as Pre-procedure Electronic Signature(s) Signed: 12/23/2019 5:36:08 PM By: Zandra Abts RN, BSN Entered By: Zandra Abts on 12/23/2019 15:34:23 -------------------------------------------------------------------------------- Encounter Discharge Information Details Patient Name: Date of Service: [name], [name] 12/23/2019 2:00 PM Medical Record Number: [id number] Patient Account Number: [id number] Date of Birth/Sex: Treating RN: Sep 09, 1955 (64 y.o. [name] Primary Care [place] Weekly: MA SO UDI, Judene Companion MA LSA DA T Other Clinician: Referring Katja Blue: Treating Cyndy Braver/Extender: Baltazar Najjar MA SO UDI, ELHA MA LSA DA T Weeks in Treatment: 24 Encounter Discharge Information Items Discharge Condition: Stable Ambulatory Status: Crutches Discharge Destination: Home Transportation: Private Auto Accompanied By: alone Schedule Follow-up Appointment: Yes Clinical Summary of Care: Patient Declined Electronic Signature(s) Signed: 12/23/2019 5:36:08 PM By: Zandra Abts RN, BSN Entered By: Zandra Abts on 12/23/2019 17:29:38 -------------------------------------------------------------------------------- Lower Extremity Assessment Details Patient Name: Date of Service: [name], [name] 12/23/2019 2:00 PM Medical Record Number: [id number] Patient Account Number: [id number] Date of Birth/Sex: Treating RN: Jul 09, 1955 (64 y.o. [name] Primary Care Creta Dorame: MA SO UDI, [place] MA LSA DA T Other Clinician: Referring Arrie Borrelli: Treating Mckenzie Toruno/Extender: Baltazar Najjar MA SO UDI, ELHA MA LSA DA T Weeks in Treatment: 73 Edema Assessment Assessed: [Left: No] [Right: No] Edema: [Left: N] [Right: o] Calf Left: Right: Point of Measurement: 43 cm From Medial Instep 34 cm Ankle Left: Right: Point of Measurement: 12 cm From Medial Instep 22  cm Electronic Signature(s) Signed: 12/24/2019 5:28:36 PM By: Yevonne Pax RN Entered By: Yevonne Pax on 12/23/2019 14:42:33 -------------------------------------------------------------------------------- Multi Wound Chart Details Patient Name: Date of Service: [name], [name] 12/23/2019 2:00 PM Medical Record Number: [id number] Patient Account Number: [id number] Date of Birth/Sex: Treating RN: Jun 02, 1955 (64 y.o. [name] Primary Care Garrett Mitchum: MA SO UDI, ELHA MA LSA DA T Other Clinician: Referring Quinetta Shilling: Treating  Lenon Kuennen/Extender: Baltazar Najjar MA SO UDI, ELHA MA LSA DA T Weeks in Treatment: 47 Vital Signs Height(in): 68 Pulse(bpm): 72 Weight(lbs): 265 Blood Pressure(mmHg): 120/69 Body Mass Index(BMI): 40 Temperature(F): 98.1 Respiratory Rate(breaths/min): 18 Photos: [9:No Photos Right, Medial Foot] [N/A:N/A N/A] Wound Location: [9:Blister] [N/A:N/A] Wounding Event: [9:Diabetic Wound/Ulcer of the Lower] [N/A:N/A] Primary Etiology: [9:Extremity 06/18/2018] [N/A:N/A] Date Acquired: [9:47] [N/A:N/A] Weeks of Treatment: [9:Open] [N/A:N/A] Wound Status: [9:1.3x1.2x0.8] [N/A:N/A] Measurements L x W x D (cm) [9:1.225] [N/A:N/A] A (cm) : rea [9:0.98] [N/A:N/A] Volume (cm) : [9:88.90%] [N/A:N/A] % Reduction in Area: [9:90.10%] [N/A:N/A] % Reduction in Volume: [9:Grade 2] [N/A:N/A] Classification: [9:Compression Therapy] [N/A:N/A] Treatment Notes Electronic Signature(s) Signed: 12/23/2019 5:36:08 PM By: Zandra Abts RN, BSN Signed: 12/24/2019 9:01:39 AM By: Baltazar Najjar MD Entered By: Baltazar Najjar on 12/23/2019 15:44:14 -------------------------------------------------------------------------------- Multi-Disciplinary Care Plan Details Patient Name: Date of Service: [name], [name] 12/23/2019 2:00 PM Medical Record Number: [id number] Patient Account Number: [id number] Date of Birth/Sex: Treating RN: 1955/08/07 (64 y.o. [name] Primary Care Lyon Dumont: MA SO UDI, Judene Companion MA LSA DA T Other Clinician: Referring Cecia Egge: Treating Georgi Navarrete/Extender: Baltazar Najjar MA SO UDI, ELHA MA LSA DA T Weeks in Treatment: 5 Active Inactive Wound/Skin Impairment Nursing Diagnoses: Impaired tissue integrity Knowledge deficit related to ulceration/compromised skin integrity Goals: Patient/caregiver will verbalize understanding of skin care regimen Date Initiated: 01/28/2019 Target Resolution Date: 01/24/2020 Goal Status: Active Interventions: Assess patient/caregiver ability to obtain necessary supplies Assess patient/caregiver ability to perform ulcer/skin care regimen upon admission and as needed Assess ulceration(s) every visit Provide education on ulcer and skin care Notes: Electronic Signature(s) Signed: 12/23/2019 5:36:08 PM By: Zandra Abts RN, BSN Signed: 12/23/2019 5:36:08 PM By: Zandra Abts RN, BSN Entered By: Zandra Abts on 12/23/2019 17:28:30 -------------------------------------------------------------------------------- Pain Assessment Details Patient Name: Date of Service: [name], [name] 12/23/2019 2:00 PM Medical Record Number: [id number] Patient Account Number: [id number] Date of Birth/Sex: Treating RN: August 20, 1955 (64 y.o. [name] Primary Care Donyea Gafford: MA SO UDI, [place] MA LSA DA T Other Clinician: Referring Javarian Jakubiak: Treating Jalynn Waddell/Extender: Baltazar Najjar MA SO UDI, ELHA MA LSA DA T Weeks in Treatment: 8 Active Problems Location of Pain Severity and Description of Pain Patient Has Paino No Site Locations Pain Management and Medication Current Pain Management: Electronic Signature(s) Signed: 12/23/2019 5:36:08 PM By: Zandra Abts RN, BSN Signed: 12/24/2019 1:53:43 PM By: Karl Ito Entered By: Karl Ito on 12/23/2019 14:27:57 -------------------------------------------------------------------------------- Patient/Caregiver Education Details Patient  Name: Date of Service: Sarah Dietrich Pates. 12/6/2021andnbsp2:00 PM Medical Record Number: [id number] Patient Account Number: [id number] Date of Birth/Gender: Treating RN: Nov 22, 1955 (64 y.o. [name] Primary Care Physician: MA SO UDI, Judene Companion MA LSA DA T Other Clinician: Referring Physician: Treating Physician/Extender: Baltazar Najjar MA SO UDI, ELHA MA LSA DA T Weeks in Treatment: 33 Education Assessment Education Provided To: Patient Education Topics Provided Wound/Skin Impairment: Methods: Explain/Verbal Responses: State content correctly [employer]) Signed: 12/23/2019 5:36:08 PM By: Zandra Abts RN, BSN Entered By: Zandra Abts on 12/23/2019 17:28:47 -------------------------------------------------------------------------------- Wound Assessment Details Patient Name: Date of Service: [name], [name] 12/23/2019 2:00 PM Medical Record Number: [id number] Patient Account Number: [id number] Date of Birth/Sex: Treating RN: 03-10-1955 (64 y.o. [name] Primary Care Aryiah Monterosso: MA SO UDI, [place] MA LSA DA T Other Clinician: Referring Elfrieda Espino: Treating Baine Decesare/Extender: Baltazar Najjar MA SO UDI, ELHA MA LSA DA T Weeks in Treatment: 26 Wound Status Wound Number: 9 Primary Etiology: Diabetic Wound/Ulcer of the Lower Extremity Wound Location: Right, Medial Foot  Wound Status: Open Wounding Event: Blister Date Acquired: 06/18/2018 Weeks Of Treatment: 47 Clustered Wound: No Wound Measurements Length: (cm) 1.3 Width: (cm) 1.2 Depth: (cm) 0.8 Area: (cm) 1.225 Volume: (cm) 0.98 % Reduction in Area: 88.9% % Reduction in Volume: 90.1% Wound Description Classification: Grade 2 Treatment Notes Wound #9 (Foot) Wound Laterality: Right, Medial Cleanser Wound Cleanser Discharge Instruction: Cleanse the wound with wound cleanser prior to applying a clean dressing using gauze sponges, not tissue or cotton balls. Peri-Wound Care Sween Lotion  (Moisturizing lotion) Discharge Instruction: Apply moisturizing lotion as directed Topical Primary Dressing Promogran Prisma Matrix, 4.34 (sq in) (silver collagen) Discharge Instruction: Moisten with hydrogel Secondary Dressing ABD Pad, 5x9 Discharge Instruction: Apply over primary dressing as directed. Zetuvit Plus 4x8 in Discharge Instruction: Apply over primary dressing as directed. Felt 2.5 yds x 5.5 in Discharge Instruction: Apply over primary dressing as directed. Secured With Compression Wrap ThreePress (3 layer compression wrap) Discharge Instruction: Apply three layer compression as directed. Compression Stockings Add-Ons Electronic Signature(s) Signed: 12/23/2019 5:36:08 PM By: Zandra Abts RN, BSN Signed: 12/24/2019 1:53:43 PM By: Karl Ito Entered By: Karl Ito on 12/23/2019 14:39:22 -------------------------------------------------------------------------------- Vitals Details Patient Name: Date of Service: [name], [name] 12/23/2019 2:00 PM Medical Record Number: [id number] Patient Account Number: [id number] Date of Birth/Sex: Treating RN: 1955/07/03 (64 y.o. [name] Primary Care Daleen Steinhaus: MA SO UDI, [place] MA LSA DA T Other Clinician: Referring Darneshia Demary: Treating Jaxxson Cavanah/Extender: Baltazar Najjar MA SO UDI, ELHA MA LSA DA T Weeks in Treatment: 89 Vital Signs Time Taken: 14:27 Temperature (F): 98.1 Height (in): 68 Pulse (bpm): 72 Weight (lbs): 265 Respiratory Rate (breaths/min): 18 Body Mass Index (BMI): 40.3 Blood Pressure (mmHg): 120/69 Reference Range: 80 - 120 mg / dl Electronic Signature(s) Signed: 12/24/2019 1:53:43 PM By: Karl Ito Entered By: Karl Ito on 12/23/2019 14:27:49

## 2019-12-26 ENCOUNTER — Other Ambulatory Visit: Payer: Self-pay | Admitting: Internal Medicine

## 2019-12-30 ENCOUNTER — Other Ambulatory Visit: Payer: Self-pay

## 2019-12-30 ENCOUNTER — Encounter (HOSPITAL_BASED_OUTPATIENT_CLINIC_OR_DEPARTMENT_OTHER): Payer: Medicare Other | Admitting: Internal Medicine

## 2019-12-30 DIAGNOSIS — E11621 Type 2 diabetes mellitus with foot ulcer: Secondary | ICD-10-CM | POA: Diagnosis not present

## 2019-12-31 ENCOUNTER — Other Ambulatory Visit: Payer: Self-pay

## 2019-12-31 DIAGNOSIS — E1169 Type 2 diabetes mellitus with other specified complication: Secondary | ICD-10-CM

## 2019-12-31 MED ORDER — JANUMET XR 50-500 MG PO TB24: 1.0000 | ORAL_TABLET | Freq: Two times a day (BID) | ORAL | 0 refills | Status: DC

## 2019-12-31 NOTE — Progress Notes (Addendum)
MITSUYE, SCHRODT ([id number]) Visit Report for 12/30/2019 Arrival Information Details Patient Name: Date of Service: Sarah, [name] 12/30/2019 1:45 PM Medical Record Number: [id number] Patient Account Number: [id number] Date of Birth/Sex: Treating RN: 07/27/1955 (64 y.o. [name], Millard.Loa Primary Care Erza Mothershead: MA SO UDI, Home MA LSA DA T Other Clinician: Referring Belia Febo: Treating Briyanna Billingham/Extender: Baltazar Najjar MA SO UDI, ELHA MA LSA DA T Weeks in Treatment: 48 Visit Information History Since Last Visit Added or deleted any medications: No Patient Arrived: Crutches Any new allergies or adverse reactions: No Arrival Time: 13:45 Had a fall or experienced change in No Accompanied By: self activities of daily living that may affect Transfer Assistance: None risk of falls: Patient Identification Verified: Yes Signs or symptoms of abuse/neglect since last visito No Secondary Verification Process Completed: Yes Hospitalized since last visit: No Patient Requires Transmission-Based Precautions: No Implantable device outside of the clinic excluding No Patient Has Alerts: No cellular tissue based products placed in the center since last visit: Has Dressing in Place as Prescribed: Yes Has Compression in Place as Prescribed: Yes Pain Present Now: No Electronic Signature(s) Signed: 12/31/2019 4:43:44 PM By: Shawn Stall Entered By: Shawn Stall on 12/31/2019 07:53:17 -------------------------------------------------------------------------------- Compression Therapy Details Patient Name: Date of Service: [facility] 12/30/2019 1:45 PM Medical Record Number: [id number] Patient Account Number: [id number] Date of Birth/Sex: Treating RN: 06/08/1955 (64 y.o. [name] Primary Care Kanda Deluna: MA SO UDI, Judene Companion MA LSA DA T Other Clinician: Referring Damonie Furney: Treating Deonna Krummel/Extender: Baltazar Najjar MA SO UDI, ELHA MA LSA DA T Weeks in Treatment:  48 Compression Therapy Performed for Wound Assessment: Wound #9 Right,Medial Foot Performed By: Clinician Zandra Abts, RN Compression Type: Three Layer Post Procedure Diagnosis Same as Pre-procedure Electronic Signature(s) Signed: 12/31/2019 5:24:10 PM By: Zandra Abts RN, BSN Entered By: Zandra Abts on 12/31/2019 13:34:20 -------------------------------------------------------------------------------- Encounter Discharge Information Details Patient Name: Date of Service: [facility], [name] 12/30/2019 1:45 PM Medical Record Number: [id number] Patient Account Number: [id number] Date of Birth/Sex: Treating RN: 10/05/1955 (64 y.o. [name] Primary Care Shynice Sigel: MA SO UDI, [place] MA LSA DA T Other Clinician: Referring Araiya Tilmon: Treating Keyra Virella/Extender: Baltazar Najjar MA SO UDI, ELHA MA LSA DA T Weeks in Treatment: 10 Encounter Discharge Information Items Post Procedure Vitals Schedule Follow-up Appointment: No Temperature (F): 98.4 Clinical Summary of Care: Pulse (bpm): 73 Respiratory Rate (breaths/min): 20 Blood Pressure (mmHg): 133/77 Electronic Signature(s) Signed: 12/31/2019 4:43:44 PM By: Shawn Stall Entered By: Shawn Stall on 12/31/2019 16:43:19 -------------------------------------------------------------------------------- Multi Wound Chart Details Patient Name: Date of Service: [facility], Myli J. 12/30/2019 1:45 PM Medical Record Number: [id number] Patient Account Number: [id number] Date of Birth/Sex: Treating RN: [birth year] (64 y.o. [name] Primary Care Deretha Ertle: MA SO UDI, Judene Companion MA LSA DA T Other Clinician: Referring Tiahna Cure: Treating Diquan Kassis/Extender: Baltazar Najjar MA SO UDI, ELHA MA LSA DA T Weeks in Treatment: 48 Vital Signs Height(in): 68 Pulse(bpm): 73 Weight(lbs): 265 Blood Pressure(mmHg): 133/77 Body Mass Index(BMI): 40 Temperature(F): 98.4 Respiratory Rate(breaths/min): 20 Photos: [9:No Photos Right, Medial  Foot] [N/A:N/A N/A] Wound Location: [9:Blister] [N/A:N/A] Wounding Event: [9:Diabetic Wound/Ulcer of the Lower] [N/A:N/A] Primary Etiology: [9:Extremity Glaucoma, Chronic sinus] [N/A:N/A] Comorbid History: [9:problems/congestion, Anemia, Sleep Apnea, Hypertension, Type II Diabetes, Rheumatoid Arthritis, Neuropathy 06/18/2018] [N/A:N/A] Date Acquired: [9:48] [N/A:N/A] Weeks of Treatment: [9:Open] [N/A:N/A] Wound Status: [9:1.2x1.1x0.8] [N/A:N/A] Measurements L x W x D (cm) [9:1.037] [N/A:N/A] A (cm) : rea [9:0.829] [N/A:N/A] Volume (cm) : [9:90.60%] [N/A:N/A] % Reduction in A rea: [9:91.60%] [N/A:N/A] %  Reduction in Volume: [9:Grade 2] [N/A:N/A] Classification: [9:Medium] [N/A:N/A] Exudate A mount: [9:Serosanguineous] [N/A:N/A] Exudate Type: [9:red, brown] [N/A:N/A] Exudate Color: [9:Thickened] [N/A:N/A] Wound Margin: [9:Large (67-100%)] [N/A:N/A] Granulation A mount: [9:None Present (0%)] [N/A:N/A] Necrotic A mount: [9:Fat Layer (Subcutaneous Tissue): Yes N/A] Exposed Structures: [9:Fascia: No Tendon: No Muscle: No Joint: No Bone: No None] [N/A:N/A] Epithelialization: [9:Debridement - Excisional] [N/A:N/A] Debridement: Pre-procedure Verification/Time Out 14:12 [N/A:N/A] Taken: [9:Lidocaine 5% topical ointment] [N/A:N/A] Pain Control: [9:Callus, Subcutaneous] [N/A:N/A] Tissue Debrided: [9:Skin/Subcutaneous Tissue] [N/A:N/A] Level: [9:1.32] [N/A:N/A] Debridement A (sq cm): [9:rea Blade] [N/A:N/A] Instrument: [9:Moderate] [N/A:N/A] Bleeding: [9:Silver Nitrate] [N/A:N/A] Hemostasis A chieved: [9:0] [N/A:N/A] Procedural Pain: [9:0] [N/A:N/A] Post Procedural Pain: [9:Procedure was tolerated well] [N/A:N/A] Debridement Treatment Response: [9:1.2x1.1x0.8] [N/A:N/A] Post Debridement Measurements L x W x D (cm) [9:0.829] [N/A:N/A] Post Debridement Volume: (cm) [9:periwound thick callous.] [N/A:N/A] Assessment Notes: [9:Compression Therapy] [N/A:N/A] Procedures Performed:  [9:Debridement] Treatment Notes Wound #9 (Foot) Wound Laterality: Right, Medial Cleanser Wound Cleanser Discharge Instruction: Cleanse the wound with wound cleanser prior to applying a clean dressing using gauze sponges, not tissue or cotton balls. Peri-Wound Care Sween Lotion (Moisturizing lotion) Discharge Instruction: Apply moisturizing lotion as directed Topical Primary Dressing Promogran Prisma Matrix, 4.34 (sq in) (silver collagen) Discharge Instruction: Moisten with hydrogel Secondary Dressing ABD Pad, 5x9 Discharge Instruction: Apply over primary dressing as directed. Zetuvit Plus 4x8 in Discharge Instruction: Apply over primary dressing as directed. Felt 2.5 yds x 5.5 in Discharge Instruction: Apply over primary dressing as directed. Secured With Compression Wrap ThreePress (3 layer compression wrap) Discharge Instruction: Apply three layer compression as directed. Compression Stockings Add-Ons Electronic Signature(s) Signed: 01/02/2020 8:09:40 AM By: Baltazar Najjar MD Signed: 01/03/2020 5:46:53 PM By: Zandra Abts RN, BSN Entered By: Baltazar Najjar on 01/01/2020 10:39:33 -------------------------------------------------------------------------------- Multi-Disciplinary Care Plan Details Patient Name: Date of Service: [facility], [name] 12/30/2019 1:45 PM Medical Record Number: [id number] Patient Account Number: [id number] Date of Birth/Sex: Treating RN: 1955/03/18 (64 y.o. [name] Primary Care Archita Lomeli: MA SO UDI, Judene Companion MA LSA DA T Other Clinician: Referring Saivion Goettel: Treating Lanett Lasorsa/Extender: Baltazar Najjar MA SO UDI, ELHA MA LSA DA T Weeks in Treatment: 30 Active Inactive Wound/Skin Impairment Nursing Diagnoses: Impaired tissue integrity Knowledge deficit related to ulceration/compromised skin integrity Goals: Patient/caregiver will verbalize understanding of skin care regimen Date Initiated: 01/28/2019 Target Resolution Date:  01/24/2020 Goal Status: Active Interventions: Assess patient/caregiver ability to obtain necessary supplies Assess patient/caregiver ability to perform ulcer/skin care regimen upon admission and as needed Assess ulceration(s) every visit Provide education on ulcer and skin care Notes: Electronic Signature(s) Signed: 12/31/2019 5:24:10 PM By: Zandra Abts RN, BSN Entered By: Zandra Abts on 12/31/2019 13:36:38 -------------------------------------------------------------------------------- Pain Assessment Details Patient Name: Date of Service: [name], [name] 12/30/2019 1:45 PM Medical Record Number: [id number] Patient Account Number: [id number] Date of Birth/Sex: Treating RN: 1955-06-14 (64 y.o. [name] Primary Care [place] Paiz: MA SO UDI, [place] MA LSA DA T Other Clinician: Referring Nithya Meriweather: Treating Yvetta Drotar/Extender: Baltazar Najjar MA SO UDI, ELHA MA LSA DA T Weeks in Treatment: 42 Active Problems Location of Pain Severity and Description of Pain Patient Has Paino No Site Locations Rate the pain. Current Pain Level: 0 Pain Management and Medication Current Pain Management: Medication: No Cold Application: No Rest: No Massage: No Activity: No T.E.N.S.: No Heat Application: No Leg drop or elevation: No Is the Current Pain Management Adequate: Adequate How does your wound impact your activities of daily livingo Sleep: No Bathing: No Appetite: No Relationship With Others: No Bladder Continence:  No Emotions: No Bowel Continence: No Work: No Toileting: No Drive: No Dressing: No Hobbies: No Electronic Signature(s) Signed: 12/31/2019 4:43:44 PM By: Shawn Stall Entered By: Shawn Stall on 12/31/2019 07:53:46 -------------------------------------------------------------------------------- Patient/Caregiver Education Details Patient Name: Date of Service: [name] 12/13/2021andnbsp1:45 PM Medical Record Number: [id number] Patient  Account Number: [id number] Date of Birth/Gender: Treating RN: August 21, 1955 (64 y.o. [name] Primary Care Physician: MA SO UDI, Judene Companion MA LSA DA T Other Clinician: Referring Physician: Treating Physician/Extender: Baltazar Najjar MA SO UDI, ELHA MA LSA DA T Weeks in Treatment: 80 Education Assessment Education Provided To: Patient Education Topics Provided Wound/Skin Impairment: Methods: Explain/Verbal Responses: State content correctly [employer]) Signed: 12/31/2019 5:24:10 PM By: Zandra Abts RN, BSN Entered By: Zandra Abts on 12/31/2019 13:36:51 -------------------------------------------------------------------------------- Wound Assessment Details Patient Name: Date of Service: [name], [name] 12/30/2019 1:45 PM Medical Record Number: [id number] Patient Account Number: [id number] Date of Birth/Sex: Treating RN: 05-28-1955 (64 y.o. [name] Primary Care Sondi Desch: MA SO UDI, [place] MA LSA DA T Other Clinician: Referring Sadie Pickar: Treating Giuliano Preece/Extender: Baltazar Najjar MA SO UDI, ELHA MA LSA DA T Weeks in Treatment: 48 Wound Status Wound Number: 9 Primary Diabetic Wound/Ulcer of the Lower Extremity Etiology: Wound Location: Right, Medial Foot Wound Open Wounding Event: Blister Status: Date Acquired: 06/18/2018 Comorbid Glaucoma, Chronic sinus problems/congestion, Anemia, Sleep Weeks Of Treatment: 48 History: Apnea, Hypertension, Type II Diabetes, Rheumatoid Arthritis, Clustered Wound: No Neuropathy Photos Photo Uploaded By: Benjaman Kindler on 01/01/2020 14:52:14 Wound Measurements Length: (cm) 1.2 Width: (cm) 1.1 Depth: (cm) 0.8 Area: (cm) 1.037 Volume: (cm) 0.829 % Reduction in Area: 90.6% % Reduction in Volume: 91.6% Epithelialization: None Tunneling: No Undermining: No Wound Description Classification: Grade 2 Wound Margin: Thickened Exudate Amount: Medium Exudate Type: Serosanguineous Exudate Color: red,  brown Foul Odor After Cleansing: No Slough/Fibrino No Wound Bed Granulation Amount: Large (67-100%) Exposed Structure Necrotic Amount: None Present (0%) Fascia Exposed: No Fat Layer (Subcutaneous Tissue) Exposed: Yes Tendon Exposed: No Muscle Exposed: No Joint Exposed: No Bone Exposed: No Assessment Notes periwound thick callous. Treatment Notes Wound #9 (Foot) Wound Laterality: Right, Medial Cleanser Wound Cleanser Discharge Instruction: Cleanse the wound with wound cleanser prior to applying a clean dressing using gauze sponges, not tissue or cotton balls. Peri-Wound Care Sween Lotion (Moisturizing lotion) Discharge Instruction: Apply moisturizing lotion as directed Topical Primary Dressing Promogran Prisma Matrix, 4.34 (sq in) (silver collagen) Discharge Instruction: Moisten with hydrogel Secondary Dressing ABD Pad, 5x9 Discharge Instruction: Apply over primary dressing as directed. Zetuvit Plus 4x8 in Discharge Instruction: Apply over primary dressing as directed. Felt 2.5 yds x 5.5 in Discharge Instruction: Apply over primary dressing as directed. Secured With Compression Wrap ThreePress (3 layer compression wrap) Discharge Instruction: Apply three layer compression as directed. Compression Stockings Add-Ons Electronic Signature(s) Signed: 12/31/2019 4:43:44 PM By: Shawn Stall Entered By: Shawn Stall on 12/31/2019 07:56:38 -------------------------------------------------------------------------------- Vitals Details Patient Name: Date of Service: [facility], [name] 12/30/2019 1:45 PM Medical Record Number: [id number] Patient Account Number: [id number] Date of Birth/Sex: Treating RN: 09-06-1955 (64 y.o. [name], Millard.Loa Primary Care Danyeal Akens: MA SO UDI, [place] MA LSA DA T Other Clinician: Referring Lylla Eifler: Treating Zorana Brockwell/Extender: Baltazar Najjar MA SO UDI, ELHA MA LSA DA T Weeks in Treatment: 48 Vital Signs Time Taken: 13:45 Temperature (F):  98.4 Height (in): 68 Pulse (bpm): 73 Weight (lbs): 265 Respiratory Rate (breaths/min): 20 Body Mass Index (BMI): 40.3 Blood Pressure (mmHg): 133/77 Reference Range: 80 - 120 mg / dl Electronic Signature(s)  Signed: 12/31/2019 4:43:44 PM By: Shawn Stall Entered By: Shawn Stall on 12/31/2019 07:53:38

## 2020-01-02 ENCOUNTER — Telehealth: Payer: Self-pay | Admitting: *Deleted

## 2020-01-02 NOTE — Telephone Encounter (Signed)
There is no order for power w/c or notes regarding this from last OV on 12/17/2019. Will forward to FO to assist patient in scheduling first available appt with Provider to discuss. May be done by telehealth. Kinnie Feil, BSN, RN-BC

## 2020-01-02 NOTE — Telephone Encounter (Signed)
Pt calls and states she and dr Maryla Morrow talked about her getting a motorized wh/ch and she has not heard from anyone. Please call her at [phone]. Uses crutches but wound care doctor wants her off her feet all together and use a motorized wh/ch

## 2020-01-03 NOTE — Progress Notes (Signed)
[name] ([id number]) Visit Report for 12/30/2019 Debridement Details Patient Name: Date of Service: [name], [name] 12/30/2019 1:45 PM Medical Record Number: [id number] Patient Account Number: [id number] Date of Birth/Sex: Treating RN: Apr 22, 1955 (64 y.o. [name] Primary Care Provider: MA SO UDI, [place] MA LSA DA T Other Clinician: Referring Provider: Treating Provider/Extender: Baltazar Najjar MA SO UDI, ELHA MA LSA DA T Weeks in Treatment: 48 Debridement Performed for Assessment: Wound #9 Right,Medial Foot Performed By: Physician [name], MD Debridement Type: Debridement Severity of Tissue Pre Debridement: Fat layer exposed Level of Consciousness (Pre-procedure): Awake and Alert Pre-procedure Verification/Time Out Yes - 14:12 Taken: Start Time: 14:12 Pain Control: Lidocaine 5% topical ointment T Area Debrided (L x W): otal 1.2 (cm) x 1.1 (cm) = 1.32 (cm) Tissue and other material debrided: Viable, Non-Viable, Callus, Subcutaneous Level: Skin/Subcutaneous Tissue Debridement Description: Excisional Instrument: Blade Bleeding: Moderate Hemostasis Achieved: Silver Nitrate End Time: 14:13 Procedural Pain: 0 Post Procedural Pain: 0 Response to Treatment: Procedure was tolerated well Level of Consciousness (Post- Awake and Alert procedure): Post Debridement Measurements of Total Wound Length: (cm) 1.2 Width: (cm) 1.1 Depth: (cm) 0.8 Volume: (cm) 0.829 Character of Wound/Ulcer Post Debridement: Improved Severity of Tissue Post Debridement: Fat layer exposed Post Procedure Diagnosis Same as Pre-procedure Electronic Signature(s) Signed: 01/02/2020 8:09:40 AM By: Baltazar Najjar MD Signed: 01/03/2020 5:46:53 PM By: Zandra Abts RN, BSN Previous Signature: 12/31/2019 5:24:10 PM Version By: Zandra Abts RN, BSN Entered By: Baltazar Najjar on 01/01/2020  10:40:28 -------------------------------------------------------------------------------- HPI Details Patient Name: Date of Service: [name], [name] 12/30/2019 1:45 PM Medical Record Number: [id number] Patient Account Number: [id number] Date of Birth/Sex: Treating RN: [birth year] (64 y.o. [name] Primary Care Provider: MA SO UDI, [place] MA LSA DA T Other Clinician: Referring Provider: Treating Provider/Extender: Baltazar Najjar MA SO UDI, ELHA MA LSA DA T Weeks in Treatment: 58 History of Present Illness HPI Description: 63 yrs old bf here for follow up recurrent left charcot foot ulcer. She has DM. She is not a smoker. She states a blister developed 3 months ago at site of previous breakdown from 1 year ago. It was debrided at the podiatrist's office. She went to the ER and then sent here. She denies pain. Xray was negative for osteo. Culture from this clinic negative. 09/08/14 [name] to [name] for Charcot foot. Seen by him and MRI scheduled 09/19/14. Prior silver collagen but this was not ordered last visit, patient has been rinsing with saline alone. Re institute silver collagen every other day. F/u 2 weeks with [name] as Labor Day holiday. Supplies ordered. 09/25/14; this is a patient with a Charcot foot on the left. she has a wound over the plantar aspect of her left calcaneus. She has been seen by [name] of orthopedic surgery and has had an MRI. She is apparently going within the next week or 2 for corrective surgery which will involve an external fixator. I don't have any of the details of this. 10/06/14; The patient is a 64 yrs old bf with a left Charcot foot and ulcer on the plantar. 12/01/14 Returns post surgery with [name]. Has follow up visit later this week with him, currently in facility and NWB over this extremity. States no drainage from foot and no current wound care. On exam callus and scab in place, suspect healed. Has external fixator  in place. Will defer to [name] for management, ok to place moisturizing cream over  scabs and callus and she will call if she requires follow up here. READMISSION 01/28/2019 Patient was in the clinic in 2016 for a wound on the plantar aspect of her left calcaneus. Predominantly cared for by [name] she was referred to [name] and had corrective surgery for the position of her left ankle I believe. She had previously been here in 2015 and then prior to that in 2011 2012 that I do not have these records. More recently she has developed fairly extensive wounds on the right medial foot, left lateral foot and a small area on the right medial great toe. Right medial foot wound has been there for 6 to 7 months, left foot wound for 2 to 3 months and the right great toe only over the last few weeks. She has been followed by [name] at Dr. Laverta [place] office it sounds as though she has been undergoing callus paring and silver nitrate. The patient has been washing these off with soap and water and plan applying dry dressing Past medical history, type 2 diabetes well controlled with a recent hemoglobin A1c of 7 on 11/16, type 2 diabetes with peripheral neuropathy, previous left Charcot joint surgery bilateral Charcot joints currently, stage III chronic renal failure, hypertension, obstructive sleep apnea, history of MRSA and gastroesophageal reflux. 1/18; this is a patient with bilateral Charcot feet. Plain x-rays that I did of both of these wounds that are either at bone or precariously close to bone were done. On the right foot this showed possible lytic destruction involving the anterior aspect of the talus which may represent a wound osteomyelitis. An MRI was recommended. On the left there was no definite lytic destruction although the wound is deep undermining's and I think probably requires an MRI on this basis in any case 1/25; patient was supposed to have her MRI last Friday of her  bilateral feet however they would not do it because there was silver alginate in her dressings, will replace with calcium alginate today and will see if we can get her done today 2/1; patient did not get her MRIs done last week because of issues with the MRI department receiving orders to do bilateral feet. She has 2 deep wounds in the setting of Charcot feet deformities bilaterally. Both of them at one point had exposed bone although I had trouble demonstrating that today. I am not sure that we can offload these very aggressively as I watched her leave the clinic last week her gait is very narrow and unsteady. 2/15; the MRI of her right foot did not show osteomyelitis potentially osteonecrosis of the talus. 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 The wounds are a large wound on the right medial foot and on the left a substantial wound on the left lateral plantar calcaneus. We have been using silver alginate. Ultimately I think we need to consider a total contact cast on the right while we work through the possibility of osteomyelitis in the left. I watched her walk out with her crutches I have some trepidation about the ability to walk with the cast on her right foot. Nevertheless with her Charcot deformities I do not think there is a way to heal these short of offloading them aggressively. 2/22; the culture of the bone in the left foot that I did last week showed a few Citrobacter Koseri as well as some streptococcal species. In my attempted bone for pathology did not actually yield a  lot of bone the pathologist did not identify any osteomyelitis. 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 Is promised today and aggressive debridement of the right foot and a total contact cast which surprisingly she seemed  to tolerate quite well 03/13/2019 upon evaluation today patient appears to be doing well with her total contact cast she is here for the first cast change. She had no areas of rubbing or any other complications at this point. Overall things are doing quite well. 2/26; the patient was kindly seen by Dr. Orvan Falconer of infectious disease on 2/25. She is now on IV vancomycin PICC line placement in 2 days and oral Flagyl. This was in response to her MRI showing osteomyelitis of the left calcaneus concerning for osteomyelitis. We have been putting her right leg in a total contact cast. Our nurses report drainage. She is tolerating the total contact cast well. 3/4; the patient is started her IV antibiotics and is taking IV vancomycin and oral Flagyl. She appears to be tolerating these both well. This is for osteomyelitis involving the left calcaneus. The area on the right in the setting of bilateral Charcot deformities did not have any bone infection on MRI. We put a total contact cast on her with silver alginate as the primary dressing and she returns today in follow-up. Per our intake nurse there was too much drainage to leave this on all week therefore we will bring her back for an early change 3/8; follow-up total contact cast she is still having a lot of drainage probably too much drainage from the right foot wound will week with the same cast. She will be back on Thursday. The area on the left looks somewhat better 3/11; patient here for a total contact cast change. 3/15; patient came in for wound care evaluation. She is still on IV vancomycin and oral Flagyl for osteomyelitis in the left foot. The area on the right foot we are putting in a total contact cast. 3/18 for total contact cast change on the right foot 3/22; the patient was here for wound review today. The area on the right foot is slightly smaller in terms of surface area. However the surface of the wound is very gritty and hyper granulated.  More problematically the area on the plantar left foot has increased depth indeed in the center of this it probes to bone. We have been using Hydrofera Blue in both areas. She is on antibiotics as prescribed by infectious disease IV vancomycin and oral Flagyl 3/25; the patient is in for total contact cast change. Our intake nurse was concerned about the amount of drainage which soaked right to the multiple layers we are putting on this. Wondered about options. The wound bed actually looks as healthy as this is looked although there may not be a lot of change in dimensions things are looking a lot better. If we are going to heal this woman's wound which is in the middle of her right Charcot foot this is going to need to be offloaded. There are not many alternatives 3/29; still not a lot of improvement although the surface of the right wound looks better. We have been using Hydrofera Blue 04/17/2019 upon evaluation today patient appears to be doing well with a total contact cast. With actually having to change this twice a week. She saw Dr.  Verdell Dykman for wound care earlier in the week this is for the cast change today. That is due to the fact that she has significant drainage. Overall the wound is been looking excellent however. 4/5; we continue to put a total contact cast on this patient with Hydrofera Blue. Still a lot of drainage which precludes a simple weekly change or changing this twice a week. 4/8; total contact cast changed today. Apparently the wound looks better per our intake nurse 4/12; patient here for wound care evaluation. Wounds on the right foot have not changed in dimension none about a month left foot looks similar. Apparently her antibiotics that we are giving her for osteomyelitis in the left foot complete on Wednesday. We have been using Hydrofera Blue on the right foot under a total contact cast. Nurses report greenish drainage although I think this may be discoloration from the  [facility] 4/15; back for cast change wounds look quite good although still moderate amount of drainage. T contact cast reapplied otal 4/19;. Wound evaluation. Wound on the right foot is smaller surface looks healthy. There is still the divot in the middle part of this wound but I could not feel any bone. Area on the left foot also looks better She has completed her antibiotics but still has the PICC line in. 4/23; patient comes back for a total contact cast change this was done in the standard fashion no issues 4/26; wound measures slightly larger on the right foot which is disappointing. She sees Dr. Orvan Falconer with regards to her antibiotics on Wednesday. I believe she is on vancomycin IV and oral Flagyl. 4/29; in for her routine total contact cast change. She saw Dr. Orvan Falconer this morning. He is asking about antibiotic continuation I will be in touch with him. I did not look at her wound today 5/3; patient saw Dr. Orvan Falconer on 4/28. I did send him a note asking about the continuation of perhaps an oral regimen. I have not heard back. She still has an elevated sedimentation rate at 81. Her wounds are smaller. 5/6; in for her total contact cast change. We did not look at the wound today. She has seen Dr. Orvan Falconer and is now on oral Keflex and Flagyl. She seems to be tolerating these well 5/10; she had her PICC line removed. The wound on the right foot after some initial improvement has not made any improvement even with the total contact cast. Still having a lot of drainage. Likewise the area on the left foot really is not any better either 5/14; in for a cast change today. We will look at the right foot on Monday. If this is not making any progress I am going to try to cast the left foot. 5/17; the wound on the right foot is absolutely no different. I am going to put ointment on this area and change the cast of the left foot. We have been using silver alginate there 5/21; apparently the area  on the right foot was better per our intake nurse although I did not see it. We did a standard total contact cast change on the left foot we have been applying silver alginate to the wound here. 06/10/19-Area on the right foot per dimensions slightly better, the left foot has still some depth applying silver alginate to the wound with TCC 06/13/19-Patient here for Casting to Left foot 6/1; patient had the total contact cast were placed on the left foot. Noted some swelling in her right leg although  the dimensions of the wound on the right foot are better. We have been using PolyMem on the right and silver alginate on the left under the cast 6/7; we continue to make nice progress in both her plantar feet wound. We are putting the total contact cast on the left. Surprisingly in spite of this the area on the right medial foot in the middle of her Charcot deformities actually is making progress as well 07/01/2019 upon evaluation today patient actually appears to be doing quite well with regard to her wounds on the feet compared to when I saw her last that has been quite some time. With that being said I do believe the total contact cast has been beneficial in this left foot and she has a lot of signs of improvement she does have some callus overhanging which is caused a little bit of a ledge and an issue here. I do believe that we may be able to do something to help in that regard. Fortunately otherwise the wound seems to be progressing quite nicely. 6/21; the patient's area on the left foot that we are currently casting is just about closed. The area on the right medial foot has a healthy looking surface but may be not changed as much from last week in terms of surface area. We are using silver alginate on the right and polymen Ag under the cast 6/28; only a small open area on the left foot remains. Using silver alginate under a total contact cast. She has a diabetic shoe I have asked her to bring that for  next week where this may be closed. The area on the right foot with a Charcot deformity is also measuring slightly smaller. We are using PolyMem Ag here 7/6; left foot is totally closed. She does not require a total contact cast. She will graduate to her own diabetic shoe. The area on the right foot with a Charcot deformity measuring about the same. We have been using polymen under compression wraps. She did not do well in this area with a total contact cast 7/13; left foot remains intact. She is skillfully covering this area with foam and using her diabetic shoe. Slight area improvement of the right foot wound. Still rolled senescent looking edges. We have been using polymen 7/19; left foot remains intact. Wound measuring slightly smaller on the right foot in the middle of the Charcot deformity. We have been using PolyMem Ag 7/26; per the patient left foot remains intact. The wound on the right foot looks slightly smaller but dimensions are measuring the same. We have been using polymen Ag 8/9-Patient back after 2 weeks, wound of the right foot again looking slightly smaller, continuing to use PolyMem silver 8/16; patient has made some minor improvements in wound area on the right foot. Using polymen Ag. The left foot remains healed 8/23; continued slight improvement in dimensions. Rolled edges around the wound edge noted. We have been using polymen Ag. Patient offloading with a surgical shoe and crutches 8/30; no change in surface area. Thick rolled skin and subcutaneous tissue around the edges debris on the wound surface. We have been using polymen Ag initially with some improvement however stalled on the last 2 weeks in terms of surface area 9/13; surface area is smaller still using polymen Ag. Thick rolled skin and subcutaneous tissue around the edges requiring debridement. 9/20; again surface area is improved using polymen Ag however each week she develops thick rolled skin and subcutaneous  tissue around  the wound edges requiring debridement. She is offloading this using crutches 9/27; we've been using PolyMem AG. Wound does not look any different from last week. Raised edges of callus skin and subcutaneous tissue around the wound bed. 10/5; we been using polymen Ag. No ability to put a total contact cast on today in the clinic. Wound is about the same still rolled edges of thick callus around the margins 10/18; we put her in a total contact cast last week things did not go well. Not only does the wound looked worse macerated angry with thick callus and some warmth around it. She had another wound above this and one on the anterior right leg. I am not totally certain why this happened. She has not been systemically unwell 10/25; culture from last week grew Pseudomonas and MRSA. My experience with this MRSA is usually a true infection and Pseudomonas a colonizer I am going to put her on doxycycline today we put gentamicin on the wound surface. Continuing with silver alginate 11/1; she has tolerated her antibiotics. I gave her doxycycline. Was still using gentamicin. The original wound looks about the same thick rolled edges. The cast injury just above it is superficial 11/8; even after an aggressive debridement last week the wound on her medial Charcot foot on the right looks about the same. The surface looks satisfactory however rolled edges of callus thick skin and subcutaneous tissue. She did not do well on 2 separate occasions in a total contact cast. The cast abrasion she has on the medial foot is just above closed. We have been using polymen Ag. She is attempting to offload this is much as she can with her crutches 11/15; the wound on the Charcot right foot about the same in terms of surface area.. The area above this on the ankle is healed. 11/29; Charcot right foot wound. This is medially. Wound may be slightly smaller however still raised edges of callus and skin around the  wound circumference. We have been using polymen 12/6; Charcot right foot wound. This is medially on the plantar aspect. I did not debride this today wound looks about the same in a week she is reappeared with raised edges of callus and thick skin around the wound. We have been using polymen no real improvement 12/13; again an extensive debridement today. Thick callus skin and subcutaneous tissue around the wound margin with undermining. We have been using silver collagen since last week Electronic Signature(s) Signed: 01/02/2020 8:09:40 AM By: Baltazar Najjar MD Entered By: Baltazar Najjar on 01/01/2020 10:41:07 -------------------------------------------------------------------------------- Physical Exam Details Patient Name: Date of Service: [name], [name] 12/30/2019 1:45 PM Medical Record Number: [id number] Patient Account Number: [id number] Date of Birth/Sex: Treating RN: [birth year] (64 y.o. [name] Primary Care Provider: MA SO UDI, [place] MA LSA DA T Other Clinician: Referring Provider: Treating Provider/Extender: Baltazar Najjar MA SO UDI, ELHA MA LSA DA T Weeks in Treatment: 48 Constitutional Sitting or standing Blood Pressure is within target range for patient.. Pulse regular and within target range for patient.Marland Kitchen Respirations regular, non-labored and within target range.. Temperature is normal and within the target range for the patient.Marland Kitchen Appears in no distress. Cardiovascular No pulses are palpable on the right. Notes Wound exam; right medial plantar foot again in the setting of Charcot deformity and probably some degree of bony protrusion underneath this. Thick rim of skin and callus I remove this again with a #15 scalpel and pickups. Hemostasis with a pressure dressing. There is no  exposed bone. Again all of this is debrided down to an area around her wound Electronic Signature(s) Signed: 01/02/2020 8:09:40 AM By: Baltazar Najjar MD Entered By: Baltazar Najjar  on 01/01/2020 10:42:39 -------------------------------------------------------------------------------- Physician Orders Details Patient Name: Date of Service: [name], [name] 12/30/2019 1:45 PM Medical Record Number: [id number] Patient Account Number: [id number] Date of Birth/Sex: Treating RN: 1955/12/23 (64 y.o. [name] Primary Care Provider: MA SO UDI, [place] MA LSA DA T Other Clinician: Referring Provider: Treating Provider/Extender: Baltazar Najjar MA SO UDI, ELHA MA LSA DA T Weeks in Treatment: 37 Verbal / Phone Orders: No Diagnosis Coding ICD-10 Coding Code Description E11.621 Type 2 diabetes mellitus with foot ulcer L97.511 Non-pressure chronic ulcer of other part of right foot limited to breakdown of skin E11.42 Type 2 diabetes mellitus with diabetic polyneuropathy M14.671 Charcot's joint, right ankle and foot Follow-up Appointments Return Appointment in 1 week. Bathing/ Shower/ Hygiene May shower with protection but do not get wound dressing(s) wet. Edema Control - Lymphedema / SCD / Other Elevate legs to the level of the heart or above for 30 minutes daily and/or when sitting, a frequency of: - throughout the day Avoid standing for long periods of time. Patient to wear own compression stockings every day. Wound Treatment Wound #9 - Foot Wound Laterality: Right, Medial Cleanser: Wound Cleanser 1 x Per Week/Other:wound center to change weekly Discharge Instructions: Cleanse the wound with wound cleanser prior to applying a clean dressing using gauze sponges, not tissue or cotton balls. Peri-Wound Care: Sween Lotion (Moisturizing lotion) 1 x Per Week/Other:wound center to change weekly Discharge Instructions: Apply moisturizing lotion as directed Prim Dressing: Promogran Prisma Matrix, 4.34 (sq in) (silver collagen) ary 1 x Per Week/Other:wound center to change weekly Discharge Instructions: Moisten with hydrogel Secondary Dressing: ABD Pad, 5x9 1 x Per  Week/Other:wound center to change weekly Discharge Instructions: Apply over primary dressing as directed. Secondary Dressing: Zetuvit Plus 4x8 in 1 x Per Week/Other:wound center to change weekly Discharge Instructions: Apply over primary dressing as directed. Secondary Dressing: Felt 2.5 yds x 5.5 in 1 x Per Week/Other:wound center to change weekly Discharge Instructions: Apply over primary dressing as directed. Compression Wrap: ThreePress (3 layer compression wrap) 1 x Per Week/Other:wound center to change weekly Discharge Instructions: Apply three layer compression as directed. Electronic Signature(s) Signed: 12/31/2019 5:24:10 PM By: Zandra Abts RN, BSN Signed: 01/02/2020 8:09:40 AM By: Baltazar Najjar MD Entered By: Zandra Abts on 12/31/2019 13:36:19 -------------------------------------------------------------------------------- Problem List Details Patient Name: Date of Service: [name], [name] 12/30/2019 1:45 PM Medical Record Number: [id number] Patient Account Number: [id number] Date of Birth/Sex: Treating RN: January 03, 1956 (64 y.o. 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 of 01/28/2019 No Yes skin E11.42 Type 2 diabetes mellitus with diabetic polyneuropathy 01/28/2019 No Yes M14.671 Charcot's joint, right ankle and foot 01/28/2019 No Yes Inactive Problems ICD-10 Code Description Active Date Inactive Date L97.524 Non-pressure chronic ulcer of other part of left foot with necrosis of bone 01/28/2019 01/28/2019 L97.514 Non-pressure chronic ulcer of other part of right foot with necrosis of  bone 01/28/2019 01/28/2019 M14.672 Charcot's joint, left ankle and foot 01/28/2019 01/28/2019 N82.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 12/30/2019 1:45 PM Medical Record Number: [id number] Patient Account Number: [id number] Date of Birth/Sex: Treating RN: 1955/02/15 (64 y.o. [name] Primary Care Provider: MA SO UDI, [name] MA LSA DA T Other Clinician: Referring Provider: Treating Provider/Extender: Baltazar Najjar MA SO UDI, ELHA MA LSA DA T Weeks in Treatment: 48 Subjective History of Present Illness (HPI) 64 yrs old bf here for follow up recurrent left charcot foot ulcer. She has DM. She is not a smoker. She states a blister developed 3 months ago at site of previous breakdown from 1 year ago. It was debrided at the podiatrist's office. She went to the ER and then sent here. She denies pain. Xray was negative for osteo. Culture from this clinic negative. 09/08/14 [name] to [name] for Charcot foot. Seen by him and MRI scheduled 09/19/14. Prior silver collagen but this was not ordered last visit, patient has been rinsing with saline alone. Re institute silver collagen every other day. F/u 2 weeks with [name] as Labor Day holiday. Supplies ordered. 09/25/14; this is a patient with a Charcot foot on the left. she has a wound over the plantar aspect of her left calcaneus. She has been seen by [name] of orthopedic surgery and has had an MRI. She is apparently going within the next week or 2 for corrective surgery which will involve an external fixator. I  don't have any of the details of this. 10/06/14; The patient is a 64 yrs old bf with a left Charcot foot and ulcer on the plantar. 12/01/14 Returns post surgery with [name]. Has follow up visit later this week with him, currently in facility and NWB over this extremity. States no drainage from foot and no current wound care. On exam callus and scab in place, suspect healed. Has external fixator in place. Will defer to [name] for management, ok to place moisturizing cream over scabs and callus and she will call if she requires follow up here. READMISSION 01/28/2019 Patient was in the clinic in 2016 for a wound on the plantar aspect of her left calcaneus. Predominantly cared for by [name] she was referred to [name] and had corrective surgery for the position of her left ankle I believe. She had previously been here in 2015 and then prior to that in 2011 2012 that I do not have these records. More recently she has developed fairly extensive wounds on the right medial foot, left lateral foot and a small area on the right medial great toe. Right medial foot wound has been there for 6 to 7 months, left foot wound for 2 to 3 months and the right great toe only over the last few weeks. She has been followed by [name] at Dr. Laverta [place] office it sounds as though she has been undergoing callus paring and silver nitrate. The patient has been washing these off with soap and water and plan applying dry dressing Past medical history, type 2 diabetes well controlled with a recent hemoglobin A1c of 7 on 11/16, type 2 diabetes with peripheral neuropathy, previous left Charcot joint surgery bilateral Charcot joints currently, stage III chronic renal failure, hypertension, obstructive sleep apnea, history of MRSA  and gastroesophageal reflux. 1/18; this is a patient with bilateral Charcot feet. Plain x-rays that I did of both of these wounds that are either at bone or precariously close to bone  were done. On the right foot this showed possible lytic destruction involving the anterior aspect of the talus which may represent a wound osteomyelitis. An MRI was recommended. On the left there was no definite lytic destruction although the wound is deep undermining's and I think probably requires an MRI on this basis in any case 1/25; patient was supposed to have her MRI last Friday of her bilateral feet however they would not do it because there was silver alginate in her dressings, will replace with calcium alginate today and will see if we can get her done today 2/1; patient did not get her MRIs done last week because of issues with the MRI department receiving orders to do bilateral feet. She has 2 deep wounds in the setting of Charcot feet deformities bilaterally. Both of them at one point had exposed bone although I had trouble demonstrating that today. I am not sure that we can offload these very aggressively as I watched her leave the clinic last week her gait is very narrow and unsteady. 2/15; the MRI of her right foot did not show osteomyelitis potentially osteonecrosis of the talus. 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 The wounds are a large wound on the right medial foot and on the left a substantial wound on the left lateral plantar calcaneus. We have been using silver alginate. Ultimately I think we need to consider a total contact cast on the right while we work through the possibility of osteomyelitis in the left. I watched her walk out with her crutches I have some trepidation about the ability to walk with the cast on her right foot. Nevertheless with her Charcot deformities I do not think there is a way to heal these short of offloading them aggressively. 2/22; the culture of the bone in the left foot that I did last week showed a few Citrobacter Koseri as well as some streptococcal species. In my attempted bone for pathology  did not actually yield a lot of bone the pathologist did not identify any osteomyelitis. 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 Is promised today and aggressive debridement of the right foot and a total contact cast which surprisingly she seemed to tolerate quite well 03/13/2019 upon evaluation today patient appears to be doing well with her total contact cast she is here for the first cast change. She had no areas of rubbing or any other complications at this point. Overall things are doing quite well. 2/26; the patient was kindly seen by Dr. Orvan Falconer of infectious disease on 2/25. She is now on IV vancomycin PICC line placement in 2 days and oral Flagyl. This was in response to her MRI showing osteomyelitis of the left calcaneus concerning for osteomyelitis. We have been putting her right leg in a total contact cast. Our nurses report drainage. She is tolerating the total contact cast well. 3/4; the patient is started her IV antibiotics and is taking IV vancomycin and oral Flagyl. She appears to be tolerating these both well. This is for osteomyelitis involving the left calcaneus. The area on the right in the setting of bilateral  Charcot deformities did not have any bone infection on MRI. We put a total contact cast on her with silver alginate as the primary dressing and she returns today in follow-up. Per our intake nurse there was too much drainage to leave this on all week therefore we will bring her back for an early change 3/8; follow-up total contact cast she is still having a lot of drainage probably too much drainage from the right foot wound will week with the same cast. She will be back on Thursday. The area on the left looks somewhat better 3/11; patient here for a total contact cast change. 3/15; patient came in for wound care evaluation.  She is still on IV vancomycin and oral Flagyl for osteomyelitis in the left foot. The area on the right foot we are putting in a total contact cast. 3/18 for total contact cast change on the right foot 3/22; the patient was here for wound review today. The area on the right foot is slightly smaller in terms of surface area. However the surface of the wound is very gritty and hyper granulated. More problematically the area on the plantar left foot has increased depth indeed in the center of this it probes to bone. We have been using Hydrofera Blue in both areas. She is on antibiotics as prescribed by infectious disease IV vancomycin and oral Flagyl 3/25; the patient is in for total contact cast change. Our intake nurse was concerned about the amount of drainage which soaked right to the multiple layers we are putting on this. Wondered about options. The wound bed actually looks as healthy as this is looked although there may not be a lot of change in dimensions things are looking a lot better. If we are going to heal this woman's wound which is in the middle of her right Charcot foot this is going to need to be offloaded. There are not many alternatives 3/29; still not a lot of improvement although the surface of the right wound looks better. We have been using Hydrofera Blue 04/17/2019 upon evaluation today patient appears to be doing well with a total contact cast. With actually having to change this twice a week. She saw [name] for wound care earlier in the week this is for the cast change today. That is due to the fact that she has significant drainage. Overall the wound is been looking excellent however. 4/5; we continue to put a total contact cast on this patient with Hydrofera Blue. Still a lot of drainage which precludes a simple weekly change or changing this twice a week. 4/8; total contact cast changed today. Apparently the wound looks better per our intake nurse 4/12; patient here for  wound care evaluation. Wounds on the right foot have not changed in dimension none about a month left foot looks similar. Apparently her antibiotics that we are giving her for osteomyelitis in the left foot complete on Wednesday. We have been using Hydrofera Blue on the right foot under a total contact cast. Nurses report greenish drainage although I think this may be discoloration from the [name] 4/15; back for cast change wounds look quite good although still moderate amount of drainage. T contact cast reapplied otal 4/19;. Wound evaluation. Wound on the right foot is smaller surface looks healthy. There is still the divot in the middle part of this wound but I could not feel any bone. Area on the left foot also looks better She has completed her antibiotics but still  has the PICC line in. 4/23; patient comes back for a total contact cast change this was done in the standard fashion no issues 4/26; wound measures slightly larger on the right foot which is disappointing. She sees Dr. Orvan Falconer with regards to her antibiotics on Wednesday. I believe she is on vancomycin IV and oral Flagyl. 4/29; in for her routine total contact cast change. She saw Dr. Orvan Falconer this morning. He is asking about antibiotic continuation I will be in touch with him. I did not look at her wound today 5/3; patient saw Dr. Orvan Falconer on 4/28. I did send him a note asking about the continuation of perhaps an oral regimen. I have not heard back. She still has an elevated sedimentation rate at 81. Her wounds are smaller. 5/6; in for her total contact cast change. We did not look at the wound today. She has seen Dr. Orvan Falconer and is now on oral Keflex and Flagyl. She seems to be tolerating these well 5/10; she had her PICC line removed. The wound on the right foot after some initial improvement has not made any improvement even with the total contact cast. Still having a lot of drainage. Likewise the area on the left foot  really is not any better either 5/14; in for a cast change today. We will look at the right foot on Monday. If this is not making any progress I am going to try to cast the left foot. 5/17; the wound on the right foot is absolutely no different. I am going to put ointment on this area and change the cast of the left foot. We have been using silver alginate there 5/21; apparently the area on the right foot was better per our intake nurse although I did not see it. We did a standard total contact cast change on the left foot we have been applying silver alginate to the wound here. 06/10/19-Area on the right foot per dimensions slightly better, the left foot has still some depth applying silver alginate to the wound with TCC 06/13/19-Patient here for Casting to Left foot 6/1; patient had the total contact cast were placed on the left foot. Noted some swelling in her right leg although the dimensions of the wound on the right foot are better. We have been using PolyMem on the right and silver alginate on the left under the cast 6/7; we continue to make nice progress in both her plantar feet wound. We are putting the total contact cast on the left. Surprisingly in spite of this the area on the right medial foot in the middle of her Charcot deformities actually is making progress as well 07/01/2019 upon evaluation today patient actually appears to be doing quite well with regard to her wounds on the feet compared to when I saw her last that has been quite some time. With that being said I do believe the total contact cast has been beneficial in this left foot and she has a lot of signs of improvement she does have some callus overhanging which is caused a little bit of a ledge and an issue here. I do believe that we may be able to do something to help in that regard. Fortunately otherwise the wound seems to be progressing quite nicely. 6/21; the patient's area on the left foot that we are currently casting is  just about closed. The area on the right medial foot has a healthy looking surface but may be not changed as much from last week  in terms of surface area. We are using silver alginate on the right and polymen Ag under the cast 6/28; only a small open area on the left foot remains. Using silver alginate under a total contact cast. She has a diabetic shoe I have asked her to bring that for next week where this may be closed. The area on the right foot with a Charcot deformity is also measuring slightly smaller. We are using PolyMem Ag here 7/6; left foot is totally closed. She does not require a total contact cast. She will graduate to her own diabetic shoe. ooThe area on the right foot with a Charcot deformity measuring about the same. We have been using polymen under compression wraps. She did not do well in this area with a total contact cast 7/13; left foot remains intact. She is skillfully covering this area with foam and using her diabetic shoe. Slight area improvement of the right foot wound. Still rolled senescent looking edges. We have been using polymen 7/19; left foot remains intact. Wound measuring slightly smaller on the right foot in the middle of the Charcot deformity. We have been using PolyMem Ag 7/26; per the patient left foot remains intact. The wound on the right foot looks slightly smaller but dimensions are measuring the same. We have been using polymen Ag 8/9-Patient back after 2 weeks, wound of the right foot again looking slightly smaller, continuing to use PolyMem silver 8/16; patient has made some minor improvements in wound area on the right foot. Using polymen Ag. The left foot remains healed 8/23; continued slight improvement in dimensions. Rolled edges around the wound edge noted. We have been using polymen Ag. Patient offloading with a surgical shoe and crutches 8/30; no change in surface area. Thick rolled skin and subcutaneous tissue around the edges debris on the  wound surface. We have been using polymen Ag initially with some improvement however stalled on the last 2 weeks in terms of surface area 9/13; surface area is smaller still using polymen Ag. Thick rolled skin and subcutaneous tissue around the edges requiring debridement. 9/20; again surface area is improved using polymen Ag however each week she develops thick rolled skin and subcutaneous tissue around the wound edges requiring debridement. She is offloading this using crutches 9/27; we've been using PolyMem AG. Wound does not look any different from last week. Raised edges of callus skin and subcutaneous tissue around the wound bed. 10/5; we been using polymen Ag. No ability to put a total contact cast on today in the clinic. Wound is about the same still rolled edges of thick callus around the margins 10/18; we put her in a total contact cast last week things did not go well. Not only does the wound looked worse macerated angry with thick callus and some warmth around it. She had another wound above this and one on the anterior right leg. I am not totally certain why this happened. She has not been systemically unwell 10/25; culture from last week grew Pseudomonas and MRSA. My experience with this MRSA is usually a true infection and Pseudomonas a colonizer I am going to put her on doxycycline today we put gentamicin on the wound surface. Continuing with silver alginate 11/1; she has tolerated her antibiotics. I gave her doxycycline. Was still using gentamicin. The original wound looks about the same thick rolled edges. The cast injury just above it is superficial 11/8; even after an aggressive debridement last week the wound on her medial Charcot foot  on the right looks about the same. The surface looks satisfactory however rolled edges of callus thick skin and subcutaneous tissue. She did not do well on 2 separate occasions in a total contact cast. The cast abrasion she has on the medial foot  is just above closed. We have been using polymen Ag. She is attempting to offload this is much as she can with her crutches 11/15; the wound on the Charcot right foot about the same in terms of surface area.. The area above this on the ankle is healed. 11/29; Charcot right foot wound. This is medially. Wound may be slightly smaller however still raised edges of callus and skin around the wound circumference. We have been using polymen 12/6; Charcot right foot wound. This is medially on the plantar aspect. I did not debride this today wound looks about the same in a week she is reappeared with raised edges of callus and thick skin around the wound. We have been using polymen no real improvement 12/13; again an extensive debridement today. Thick callus skin and subcutaneous tissue around the wound margin with undermining. We have been using silver collagen since last week Objective Constitutional Sitting or standing Blood Pressure is within target range for patient.. Pulse regular and within target range for patient.Marland Kitchen Respirations regular, non-labored and within target range.. Temperature is normal and within the target range for the patient.Marland Kitchen Appears in no distress. Vitals Time Taken: 1:45 PM, Height: 68 in, Weight: 265 lbs, BMI: 40.3, Temperature: 98.4 F, Pulse: 73 bpm, Respiratory Rate: 20 breaths/min, Blood Pressure: 133/77 mmHg. Cardiovascular No pulses are palpable on the right. General Notes: Wound exam; right medial plantar foot again in the setting of Charcot deformity and probably some degree of bony protrusion underneath this. Thick rim of skin and callus I remove this again with a #15 scalpel and pickups. Hemostasis with a pressure dressing. There is no exposed bone. Again all of this is debrided down to an area around her wound Integumentary (Hair, Skin) Wound #9 status is Open. Original cause of wound was Blister. The wound is located on the Right,Medial Foot. The wound measures  1.2cm length x 1.1cm width x 0.8cm depth; 1.037cm^2 area and 0.829cm^3 volume. There is Fat Layer (Subcutaneous Tissue) exposed. There is no tunneling or undermining noted. There is a medium amount of serosanguineous drainage noted. The wound margin is thickened. There is large (67-100%) granulation within the wound bed. There is no necrotic tissue within the wound bed. General Notes: periwound thick callous. Assessment Active Problems ICD-10 Type 2 diabetes mellitus with foot ulcer Non-pressure chronic ulcer of other part of right foot limited to breakdown of skin Type 2 diabetes mellitus with diabetic polyneuropathy Charcot's joint, right ankle and foot Procedures Wound #9 Pre-procedure diagnosis of Wound #9 is a Diabetic Wound/Ulcer of the Lower Extremity located on the Right,Medial Foot .Severity of Tissue Pre Debridement is: Fat layer exposed. There was a Excisional Skin/Subcutaneous Tissue Debridement with a total area of 1.32 sq cm performed by [name], MD. With the following instrument(s): Blade to remove Viable and Non-Viable tissue/material. Material removed includes Callus and Subcutaneous Tissue and after achieving pain control using Lidocaine 5% topical ointment. No specimens were taken. A time out was conducted at 14:12, prior to the start of the procedure. A Moderate amount of bleeding was controlled with Silver Nitrate. The procedure was tolerated well with a pain level of 0 throughout and a pain level of 0 following the procedure. Post Debridement Measurements: 1.2cm  length x 1.1cm width x 0.8cm depth; 0.829cm^3 volume. Character of Wound/Ulcer Post Debridement is improved. Severity of Tissue Post Debridement is: Fat layer exposed. Post procedure Diagnosis Wound #9: Same as Pre-Procedure Pre-procedure diagnosis of Wound #9 is a Diabetic Wound/Ulcer of the Lower Extremity located on the Right,Medial Foot . There was a Three Layer Compression Therapy Procedure by  Zandra Abts, RN. Post procedure Diagnosis Wound #9: Same as Pre-Procedure Plan Follow-up Appointments: Return Appointment in 1 week. Bathing/ Shower/ Hygiene: May shower with protection but do not get wound dressing(s) wet. Edema Control - Lymphedema / SCD / Other: Elevate legs to the level of the heart or above for 30 minutes daily and/or when sitting, a frequency of: - throughout the day Avoid standing for long periods of time. Patient to wear own compression stockings every day. WOUND #9: - Foot Wound Laterality: Right, Medial Cleanser: Wound Cleanser 1 x Per Week/Other:wound center to change weekly Discharge Instructions: Cleanse the wound with wound cleanser prior to applying a clean dressing using gauze sponges, not tissue or cotton balls. Peri-Wound Care: Sween Lotion (Moisturizing lotion) 1 x Per Week/Other:wound center to change weekly Discharge Instructions: Apply moisturizing lotion as directed Prim Dressing: Promogran Prisma Matrix, 4.34 (sq in) (silver collagen) 1 x Per Week/Other:wound center to change weekly ary Discharge Instructions: Moisten with hydrogel Secondary Dressing: ABD Pad, 5x9 1 x Per Week/Other:wound center to change weekly Discharge Instructions: Apply over primary dressing as directed. Secondary Dressing: Zetuvit Plus 4x8 in 1 x Per Week/Other:wound center to change weekly Discharge Instructions: Apply over primary dressing as directed. Secondary Dressing: Felt 2.5 yds x 5.5 in 1 x Per Week/Other:wound center to change weekly Discharge Instructions: Apply over primary dressing as directed. Com pression Wrap: ThreePress (3 layer compression wrap) 1 x Per Week/Other:wound center to change weekly Discharge Instructions: Apply three layer compression as directed. #1 I continued with as my as the primary dressing 2. Even though she offloads this is best she can using crutches I think there is still too much pressure on this area 3. She did not tolerate a  total contact cast on 2 separate occasions, furthermore it really was not helpful on this area although would certainly help close her wound on the left. I find myself thinking that the patient may need referral for consideration of foot surgery to properly offload this area and get any chance for closure. All discussed this with her next time. Also see if she is previously seen somebody who I could refer her back to Electronic Signature(s) Signed: 01/02/2020 8:09:40 AM By: Baltazar Najjar MD Entered By: Baltazar Najjar on 01/01/2020 10:44:24 -------------------------------------------------------------------------------- SuperBill Details Patient Name: Date of Service: [name], [name] 12/30/2019 Medical Record Number: [id number] Patient Account Number: [id number] Date of Birth/Sex: Treating RN: 1955-12-21 (64 y.o. 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

## 2020-01-06 ENCOUNTER — Encounter (HOSPITAL_BASED_OUTPATIENT_CLINIC_OR_DEPARTMENT_OTHER): Payer: Medicare Other | Admitting: Internal Medicine

## 2020-01-06 ENCOUNTER — Other Ambulatory Visit: Payer: Self-pay

## 2020-01-06 DIAGNOSIS — E11621 Type 2 diabetes mellitus with foot ulcer: Secondary | ICD-10-CM | POA: Diagnosis not present

## 2020-01-06 NOTE — Progress Notes (Signed)
[name], [name] ([id number]) Visit Report for 01/06/2020 HPI Details Patient Name: Date of Service: [name], [name] 01/06/2020 1:45 PM Medical Record Number: [id number] Patient Account Number: [id number] Date of Birth/Sex: Treating RN: [name] 30, 1957 (64 y.o. [name] Primary Care Provider: MA SO [name], [name] Other Clinician: Referring Provider: Treating Provider/Extender: [name] [name] Weeks in Treatment: 73 History of Present Illness HPI Description: 64 yrs old bf here for follow up recurrent left charcot foot ulcer. She has DM. She is not a smoker. She states a blister developed 3 months ago at site of previous breakdown from 1 year ago. It was debrided at the podiatrist's office. She went to the ER and then sent here. She denies pain. Xray was negative for osteo. Culture from this clinic negative. 09/08/14 [address]. Kelly [name] to Dr. Victorino [name] for Charcot foot. Seen by him and MRI scheduled 09/19/14. Prior silver collagen but this was not ordered last visit, patient has been rinsing with saline alone. Re institute silver collagen every other day. F/u 2 weeks with Dr. Leanord [name] as Labor Day holiday. Supplies ordered. 09/25/14; this is a patient with a Charcot foot on the left. she has a wound over the plantar aspect of her left calcaneus. She has been seen by Dr. Victorino [name] of orthopedic surgery and has had an MRI. She is apparently going within the next week or 2 for corrective surgery which will involve an external fixator. I don'[name] have any of the details of this. 10/06/14; The patient is a 64 yrs old bf with a left Charcot foot and ulcer on the plantar. 12/01/14 Returns post surgery with Dr. Victorino [name]. Has follow up visit later this week with him, currently in facility and NWB over this extremity. States no drainage from foot and no current wound care. On exam callus and scab in place, suspect healed. Has external fixator in place. Will defer to Dr.  Victorino [name] for management, ok to place moisturizing cream over scabs and callus and she will call if she requires follow up here. READMISSION 01/28/2019 Patient was in the clinic in 2016 for a wound on the plantar aspect of her left calcaneus. Predominantly cared for by Dr. Ulice [name] she was referred to Dr. Victorino [name] and had corrective surgery for the position of her left ankle I believe. She had previously been here in 2015 and then prior to that in 2011 2012 that I do not have these records. More recently she has developed fairly extensive wounds on the right medial foot, left lateral foot and a small area on the right medial great toe. Right medial foot wound has been there for 6 to 7 months, left foot wound for 2 to 3 months and the right great toe only over the last few weeks. She has been followed by [name] at [name] office it sounds as though she has been undergoing callus paring and silver nitrate. The patient has been washing these off with soap and water and plan applying dry dressing Past medical history, type 2 diabetes well controlled with a recent hemoglobin A1c of 7 on 11/16, type 2 diabetes with peripheral neuropathy, previous left Charcot joint surgery bilateral Charcot joints currently, stage III chronic renal failure, hypertension, obstructive sleep apnea, history of MRSA and gastroesophageal reflux. 1/18; this is a patient with bilateral Charcot feet. Plain x-rays that I did of both of these wounds that are either at bone or precariously close to bone  were done. On the right foot this showed possible lytic destruction involving the anterior aspect of the talus which may represent a wound osteomyelitis. An MRI was recommended. On the left there was no definite lytic destruction although the wound is deep undermining's and I think probably requires an MRI on this basis in any case 1/25; patient was supposed to have her MRI last Friday of her bilateral feet however they would  not do it because there was silver alginate in her dressings, will replace with calcium alginate today and will see if we can get her done today 2/1; patient did not get her MRIs done last week because of issues with the MRI department receiving orders to do bilateral feet. She has 2 deep wounds in the setting of Charcot feet deformities bilaterally. Both of them at one point had exposed bone although I had trouble demonstrating that today. I am not sure that we can offload these very aggressively as I watched her leave the clinic last week her gait is very narrow and unsteady. 2/15; the MRI of her right foot did not show osteomyelitis potentially osteonecrosis of the talus. 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 The wounds are a large wound on the right medial foot and on the left a substantial wound on the left lateral plantar calcaneus. We have been using silver alginate. Ultimately I think we need to consider a total contact cast on the right while we work through the possibility of osteomyelitis in the left. I watched her walk out with her crutches I have some trepidation about the ability to walk with the cast on her right foot. Nevertheless with her Charcot deformities I do not think there is a way to heal these short of offloading them aggressively. 2/22; the culture of the bone in the left foot that I did last week showed a few Citrobacter Koseri as well as some streptococcal species. In my attempted bone for pathology did not actually yield a lot of bone the pathologist did not identify any osteomyelitis. 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 Is promised today and aggressive debridement of the right foot and a total contact cast which surprisingly she seemed to tolerate quite well 03/13/2019  upon evaluation today patient appears to be doing well with her total contact cast she is here for the first cast change. She had no areas of rubbing or any other complications at this point. Overall things are doing quite well. 2/26; the patient was kindly seen by Dr. Megan Salon of infectious disease on 2/25. She is now on IV vancomycin PICC line placement in 2 days and oral Flagyl. This was in response to her MRI showing osteomyelitis of the left calcaneus concerning for osteomyelitis. We have been putting her right leg in a total contact cast. Our nurses report drainage. She is tolerating the total contact cast well. 3/4; the patient is started her IV antibiotics and is taking IV vancomycin and oral Flagyl. She appears to be tolerating these both well. This is for osteomyelitis involving the left calcaneus. The area on the right in the setting of bilateral Charcot deformities did not have any bone infection on MRI. We put a total contact cast on her with silver alginate as the primary dressing and she returns today in follow-up. Per  our intake nurse there was too much drainage to leave this on all week therefore we will bring her back for an early change 3/8; follow-up total contact cast she is still having a lot of drainage probably too much drainage from the right foot wound will week with the same cast. She will be back on Thursday. The area on the left looks somewhat better 3/11; patient here for a total contact cast change. 3/15; patient came in for wound care evaluation. She is still on IV vancomycin and oral Flagyl for osteomyelitis in the left foot. The area on the right foot we are putting in a total contact cast. 3/18 for total contact cast change on the right foot 3/22; the patient was here for wound review today. The area on the right foot is slightly smaller in terms of surface area. However the surface of the wound is very gritty and hyper granulated. More problematically the area on  the plantar left foot has increased depth indeed in the [name] of this it probes to bone. We have been using Hydrofera Blue in both areas. She is on antibiotics as prescribed by infectious disease IV vancomycin and oral Flagyl 3/25; the patient is in for total contact cast change. Our intake nurse was concerned about the amount of drainage which soaked right to the multiple layers we are putting on this. Wondered about options. The wound bed actually looks as healthy as this is looked although there may not be a lot of change in dimensions things are looking a lot better. If we are going to heal this woman's wound which is in the middle of her right Charcot foot this is going to need to be offloaded. There are not many alternatives 3/29; still not a lot of improvement although the surface of the right wound looks better. We have been using Hydrofera Blue 04/17/2019 upon evaluation today patient appears to be doing well with a total contact cast. With actually having to change this twice a week. She saw Dr. Dellia Nims for wound care earlier in the week this is for the cast change today. That is due to the fact that she has significant drainage. Overall the wound is been looking excellent however. 4/5; we continue to put a total contact cast on this patient with Hydrofera Blue. Still a lot of drainage which precludes a simple weekly change or changing this twice a week. 4/8; total contact cast changed today. Apparently the wound looks better per our intake nurse 4/12; patient here for wound care evaluation. Wounds on the right foot have not changed in dimension none about a month left foot looks similar. Apparently her antibiotics that we are giving her for osteomyelitis in the left foot complete on Wednesday. We have been using Hydrofera Blue on the right foot under a total contact cast. Nurses report greenish drainage although I think this may be discoloration from the [facility] 4/15; back for cast  change wounds look quite good although still moderate amount of drainage. [name] contact cast reapplied otal 4/19;. Wound evaluation. Wound on the right foot is smaller surface looks healthy. There is still the divot in the middle part of this wound but I could not feel any bone. Area on the left foot also looks better She has completed her antibiotics but still has the PICC line in. 4/23; patient comes back for a total contact cast change this was done in the standard fashion no issues 4/26; wound measures slightly larger on the right  foot which is disappointing. She sees Dr. Megan Salon with regards to her antibiotics on Wednesday. I believe she is on vancomycin IV and oral Flagyl. 4/29; in for her routine total contact cast change. She saw Dr. Megan Salon this morning. He is asking about antibiotic continuation I will be in touch with him. I did not look at her wound today 5/3; patient saw Dr. Megan Salon on 4/28. I did send him a note asking about the continuation of perhaps an oral regimen. I have not heard back. She still has an elevated sedimentation rate at 81. Her wounds are smaller. 5/6; in for her total contact cast change. We did not look at the wound today. She has seen Dr. Megan Salon and is now on oral Keflex and Flagyl. She seems to be tolerating these well 5/10; she had her PICC line removed. The wound on the right foot after some initial improvement has not made any improvement even with the total contact cast. Still having a lot of drainage. Likewise the area on the left foot really is not any better either 5/14; in for a cast change today. We will look at the right foot on Monday. If this is not making any progress I am going to try to cast the left foot. 5/17; the wound on the right foot is absolutely no different. I am going to put ointment on this area and change the cast of the left foot. We have been using silver alginate there 5/21; apparently the area on the right foot was better per our  intake nurse although I did not see it. We did a standard total contact cast change on the left foot we have been applying silver alginate to the wound here. 06/10/19-Area on the right foot per dimensions slightly better, the left foot has still some depth applying silver alginate to the wound with TCC 06/13/19-Patient here for Casting to Left foot 6/1; patient had the total contact cast were placed on the left foot. Noted some swelling in her right leg although the dimensions of the wound on the right foot are better. We have been using PolyMem on the right and silver alginate on the left under the cast 6/7; we continue to make nice progress in both her plantar feet wound. We are putting the total contact cast on the left. Surprisingly in spite of this the area on the right medial foot in the middle of her Charcot deformities actually is making progress as well 07/01/2019 upon evaluation today patient actually appears to be doing quite well with regard to her wounds on the feet compared to when I saw her last that has been quite some time. With that being said I do believe the total contact cast has been beneficial in this left foot and she has a lot of signs of improvement she does have some callus overhanging which is caused a little bit of a ledge and an issue here. I do believe that we may be able to do something to help in that regard. Fortunately otherwise the wound seems to be progressing quite nicely. 6/21; the patient's area on the left foot that we are currently casting is just about closed. The area on the right medial foot has a healthy looking surface but may be not changed as much from last week in terms of surface area. We are using silver alginate on the right and polymen Ag under the cast 6/28; only a small open area on the left foot remains. Using silver alginate  under a total contact cast. She has a diabetic shoe I have asked her to bring that for next week where this may be closed.  The area on the right foot with a Charcot deformity is also measuring slightly smaller. We are using PolyMem Ag here 7/6; left foot is totally closed. She does not require a total contact cast. She will graduate to her own diabetic shoe. The area on the right foot with a Charcot deformity measuring about the same. We have been using polymen under compression wraps. She did not do well in this area with a total contact cast 7/13; left foot remains intact. She is skillfully covering this area with foam and using her diabetic shoe. Slight area improvement of the right foot wound. Still rolled senescent looking edges. We have been using polymen 7/19; left foot remains intact. Wound measuring slightly smaller on the right foot in the middle of the Charcot deformity. We have been using PolyMem Ag 7/26; per the patient left foot remains intact. The wound on the right foot looks slightly smaller but dimensions are measuring the same. We have been using polymen Ag 8/9-Patient back after 2 weeks, wound of the right foot again looking slightly smaller, continuing to use PolyMem silver 8/16; patient has made some minor improvements in wound area on the right foot. Using polymen Ag. The left foot remains healed 8/23; continued slight improvement in dimensions. Rolled edges around the wound edge noted. We have been using polymen Ag. Patient offloading with a surgical shoe and crutches 8/30; no change in surface area. Thick rolled skin and subcutaneous tissue around the edges debris on the wound surface. We have been using polymen Ag initially with some improvement however stalled on the last 2 weeks in terms of surface area 9/13; surface area is smaller still using polymen Ag. Thick rolled skin and subcutaneous tissue around the edges requiring debridement. 9/20; again surface area is improved using polymen Ag however each week she develops thick rolled skin and subcutaneous tissue around the wound  edges requiring debridement. She is offloading this using crutches 9/27; we've been using PolyMem AG. Wound does not look any different from last week. Raised edges of callus skin and subcutaneous tissue around the wound bed. 10/5; we been using polymen Ag. No ability to put a total contact cast on today in the clinic. Wound is about the same still rolled edges of thick callus around the margins 10/18; we put her in a total contact cast last week things did not go well. Not only does the wound looked worse macerated angry with thick callus and some warmth around it. She had another wound above this and one on the anterior right leg. I am not totally certain why this happened. She has not been systemically unwell 10/25; culture from last week grew Pseudomonas and MRSA. My experience with this MRSA is usually a true infection and Pseudomonas a colonizer I am going to put her on doxycycline today we put gentamicin on the wound surface. Continuing with silver alginate 11/1; she has tolerated her antibiotics. I gave her doxycycline. Was still using gentamicin. The original wound looks about the same thick rolled edges. The cast injury just above it is superficial 11/8; even after an aggressive debridement last week the wound on her medial Charcot foot on the right looks about the same. The surface looks satisfactory however rolled edges of callus thick skin and subcutaneous tissue. She did not do well on 2 separate occasions in a  total contact cast. The cast abrasion she has on the medial foot is just above closed. We have been using polymen Ag. She is attempting to offload this is much as she can with her crutches 11/15; the wound on the Charcot right foot about the same in terms of surface area.. The area above this on the ankle is healed. 11/29; Charcot right foot wound. This is medially. Wound may be slightly smaller however still raised edges of callus and skin around the wound circumference. We  have been using polymen 12/6; Charcot right foot wound. This is medially on the plantar aspect. I did not debride this today wound looks about the same in a week she is reappeared with raised edges of callus and thick skin around the wound. We have been using polymen no real improvement 12/13; again an extensive debridement today. Thick callus skin and subcutaneous tissue around the wound margin with undermining. We have been using silver collagen since last week 12/20; in spite of the debridement last week the wound really looks no difference. Circumferential callus shallow wound granulation does not look too bad. We have been using silver collagen really no improvement Electronic Signature(s) Signed: 01/06/2020 7:23:52 PM By: Sarah Najjar MD Entered By: [name] on 01/06/2020 15:29:50 -------------------------------------------------------------------------------- Physical Exam Details Patient Name: Date of Service: [name], [name] 01/06/2020 1:45 PM Medical Record Number: [id number] Patient Account Number: [id number] Date of Birth/Sex: Treating RN: 1956-01-04 (64 y.o. [name] Primary Care Provider: MA SO [name], Judene Companion Sarah LSA DA [name] Other Clinician: Referring Provider: Treating Provider/Extender: [name] [name] Weeks in Treatment: 87 Constitutional Patient is hypertensive.. Pulse regular and within target range for patient.Marland Kitchen Respirations regular, non-labored and within target range.. Temperature is normal and within the target range for the patient.Marland Kitchen Appears in no distress. Cardiovascular Pedal pulses are palpable. Notes Wound exam; right medial plantar foot. Circumferential callus around the wound. Granulation in the wound area does not look too bad however precisely the same as last week. Electronic Signature(s) Signed: 01/06/2020 7:23:52 PM By: Sarah Najjar MD Entered By: [name] on 01/06/2020  15:30:57 -------------------------------------------------------------------------------- Physician Orders Details Patient Name: Date of Service: [name], [name] 01/06/2020 1:45 PM Medical Record Number: [id number] Patient Account Number: [id number] Date of Birth/Sex: Treating RN: 1955-11-03 (64 y.o. [name] Primary Care Provider: MA SO [name], Ord Sarah LSA DA [name] Other Clinician: Referring Provider: Treating Provider/Extender: [name] [name] Weeks in Treatment: 19 Verbal / Phone Orders: No Diagnosis Coding ICD-10 Coding Code Description E11.621 Type 2 diabetes mellitus with foot ulcer L97.511 Non-pressure chronic ulcer of other part of right foot limited to breakdown of skin E11.42 Type 2 diabetes mellitus with diabetic polyneuropathy M14.671 Charcot's joint, right ankle and foot Follow-up Appointments Return Appointment in 1 week. Bathing/ Shower/ Hygiene May shower with protection but do not get wound dressing(s) wet. Edema Control - Lymphedema / SCD / Other Elevate legs to the level of the heart or above for 30 minutes daily and/or when sitting, a frequency of: - throughout the day Avoid standing for long periods of time. Patient to wear own compression stockings every day. Wound Treatment Wound #9 - Foot Wound Laterality: Right, Medial Cleanser: Wound Cleanser 1 x Per Week/Other:wound [name] to change weekly Discharge Instructions: Cleanse the wound with wound cleanser prior to applying a clean dressing using gauze sponges, not tissue or cotton balls. Peri-Wound Care: Sween Lotion (Moisturizing  lotion) 1 x Per Week/Other:wound [name] to change weekly Discharge Instructions: Apply moisturizing lotion as directed Prim Dressing: Cutimed Sorbact Swab 1 x Per Week/Other:wound [name] to change weekly ary Discharge Instructions: thin layer of hydrogel over Sorbact Secondary Dressing: ABD Pad, 5x9 1 x Per Week/Other:wound [name] to change  weekly Discharge Instructions: Apply over primary dressing as directed. Secondary Dressing: Zetuvit Plus 4x8 in 1 x Per Week/Other:wound [name] to change weekly Discharge Instructions: Apply over primary dressing as directed. Secondary Dressing: Felt 2.5 yds x 5.5 in 1 x Per Week/Other:wound [name] to change weekly Discharge Instructions: Apply over primary dressing as directed. Compression Wrap: ThreePress (3 layer compression wrap) 1 x Per Week/Other:wound [name] to change weekly Discharge Instructions: Apply three layer compression as directed. [facility] Dr. Victorino [name] ([facility]) - Charcot with non healing wound on right foot, discuss possible surgical options - (ICD10 E11.621 - Type 2 diabetes mellitus with foot ulcer) Electronic Signature(s) Signed: 01/06/2020 5:38:15 PM By: Zandra Abts RN, BSN Signed: 01/06/2020 7:23:52 PM By: Sarah Najjar MD Entered By: Zandra Abts on 01/06/2020 15:03:48 Prescription 01/06/2020 -------------------------------------------------------------------------------- Sarah Duke MD Patient Name: Provider: 1955/01/27 [id number] Date of Birth: NPI#Carmon Ginsberg [id number] Sex: DEA #: [phone] Phone #: License #: Eligha Bridegroom [facility] [name] Patient Address: [address] [place], [place] [address] [address] [place], [place] [address] [phone] Allergies codeine; pravastatin Provider's Orders Dr. Victorino [name] Hollie Salk) - ICD10: E11.621 - Charcot with non healing wound on right foot, discuss possible surgical options Hand Signature: Date(s): Electronic Signature(s) Signed: 01/06/2020 5:38:15 PM By: Zandra Abts RN, BSN Signed: 01/06/2020 7:23:52 PM By: Sarah Najjar MD Entered By: Zandra Abts on 01/06/2020 15:03:49 -------------------------------------------------------------------------------- Problem List Details Patient Name: Date of Service: [name], Sarah  J. 01/06/2020 1:45 PM Medical Record Number: [id number] Patient Account Number: [id number] Date of Birth/Sex: Treating RN: 1955-09-21 (64 y.o. [name] Primary Care Provider: MA SO [name], Judene Companion Sarah LSA DA [name] Other Clinician: Referring Provider: Treating Provider/Extender: Abe People, ELHA Sarah LSA DA [name] Weeks in Treatment: 93 Active Problems ICD-10 Encounter Code Description Active Date MDM Diagnosis E11.621 Type 2 diabetes mellitus with foot ulcer 01/28/2019 No Yes L97.511 Non-pressure chronic ulcer of other part of right foot limited to breakdown of 01/28/2019 No Yes skin E11.42 Type 2 diabetes mellitus with diabetic polyneuropathy 01/28/2019 No Yes M14.671 Charcot's joint, right ankle and foot 01/28/2019 No Yes Inactive Problems ICD-10 Code Description Active Date Inactive Date L97.524 Non-pressure chronic ulcer of other part of left foot with necrosis of bone 01/28/2019 01/28/2019 L97.514 Non-pressure chronic ulcer of other part of right foot with necrosis of bone 01/28/2019 01/28/2019 Q59.563 Charcot's joint, left ankle and foot 01/28/2019 01/28/2019 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 01/06/2020 1:45 PM Medical Record Number: [id number] Patient Account Number: [id number] Date of Birth/Sex: Treating RN: 22-Dec-1955 (64 y.o. [name] Primary Care Provider: MA SO [name], Devers Sarah LSA DA [name] Other Clinician: Referring Provider: Treating Provider/Extender: [name] [name] Weeks in Treatment: 58 Subjective History of Present Illness (HPI) 65 yrs old bf here for follow up recurrent left charcot foot  ulcer. She has DM. She is not a smoker. She states a blister developed 3  months ago at site of previous breakdown from 1 year ago. It was debrided at the podiatrist's office. She went to the ER and then sent here. She denies pain. Xray was negative for osteo. Culture from this clinic negative. 09/08/14 Referred [address] Dr. Kelly [name] to Dr. Victorino [name] for Charcot foot. Seen by him and MRI scheduled 09/19/14. Prior silver collagen but this was not ordered last visit, patient has been rinsing with saline alone. Re institute silver collagen every other day. F/u 2 weeks with Dr. Leanord [name] as Labor Day holiday. Supplies ordered. 09/25/14; this is a patient with a Charcot foot on the left. she has a wound over the plantar aspect of her left calcaneus. She has been seen by Dr. Victorino [name] of orthopedic surgery and has had an MRI. She is apparently going within the next week or 2 for corrective surgery which will involve an external fixator. I don'[name] have any of the details of this. 10/06/14; The patient is a 64 yrs old bf with a left Charcot foot and ulcer on the plantar. 12/01/14 Returns post surgery with Dr. Victorino [name]. Has follow up visit later this week with him, currently in facility and NWB over this extremity. States no drainage from foot and no current wound care. On exam callus and scab in place, suspect healed. Has external fixator in place. Will defer to Dr. Victorino [name] for management, ok to place moisturizing cream over scabs and callus and she will call if she requires follow up here. READMISSION 01/28/2019 Patient was in the clinic in 2016 for a wound on the plantar aspect of her left calcaneus. Predominantly cared for by Dr. Ulice [name] she was referred to Dr. Victorino [name] and had corrective surgery for the position of her left ankle I believe. She had previously been here in 2015 and then prior to that in 2011 2012 that I do not have these records. More recently she has developed fairly extensive wounds on the right medial foot, left  lateral foot and a small area on the right medial great toe. Right medial foot wound has been there for 6 to 7 months, left foot wound for 2 to 3 months and the right great toe only over the last few weeks. She has been followed by [name] at [name] office it sounds as though she has been undergoing callus paring and silver nitrate. The patient has been washing these off with soap and water and plan applying dry dressing Past medical history, type 2 diabetes well controlled with a recent hemoglobin A1c of 7 on 11/16, type 2 diabetes with peripheral neuropathy, previous left Charcot joint surgery bilateral Charcot joints currently, stage III chronic renal failure, hypertension, obstructive sleep apnea, history of MRSA and gastroesophageal reflux. 1/18; this is a patient with bilateral Charcot feet. Plain x-rays that I did of both of these wounds that are either at bone or precariously close to bone were done. On the right foot this showed possible lytic destruction involving the anterior aspect of the talus which may represent a wound osteomyelitis. An MRI was recommended. On the left there was no definite lytic destruction although the wound is deep undermining's and I think probably requires an MRI on this basis in any case 1/25; patient was supposed to have her MRI last Friday of her bilateral feet however they would not do it because there was silver alginate in her dressings, will replace with calcium alginate today and will see if we can get her done today 2/1; patient did not get  her MRIs done last week because of issues with the MRI department receiving orders to do bilateral feet. She has 2 deep wounds in the setting of Charcot feet deformities bilaterally. Both of them at one point had exposed bone although I had trouble demonstrating that today. I am not sure that we can offload these very aggressively as I watched her leave the clinic last week her gait is very narrow and  unsteady. 2/15; the MRI of her right foot did not show osteomyelitis potentially osteonecrosis of the talus. 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 The wounds are a large wound on the right medial foot and on the left a substantial wound on the left lateral plantar calcaneus. We have been using silver alginate. Ultimately I think we need to consider a total contact cast on the right while we work through the possibility of osteomyelitis in the left. I watched her walk out with her crutches I have some trepidation about the ability to walk with the cast on her right foot. Nevertheless with her Charcot deformities I do not think there is a way to heal these short of offloading them aggressively. 2/22; the culture of the bone in the left foot that I did last week showed a few Citrobacter Koseri as well as some streptococcal species. In my attempted bone for pathology did not actually yield a lot of bone the pathologist did not identify any osteomyelitis. 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 Is promised today and aggressive debridement of the right foot and a total contact cast which surprisingly she seemed to tolerate quite well 03/13/2019 upon evaluation today patient appears to be doing well with her total contact cast she is here for the first cast change. She had no areas of rubbing or any other complications at this point. Overall things are doing quite well. 2/26; the patient was kindly seen by Dr. Orvan Falconer of infectious disease on 2/25. She is now on IV vancomycin PICC line placement in 2 days and oral Flagyl. This was in response to her MRI showing osteomyelitis of the left calcaneus concerning for osteomyelitis. We have been putting her right leg in a total contact cast. Our nurses report  drainage. She is tolerating the total contact cast well. 3/4; the patient is started her IV antibiotics and is taking IV vancomycin and oral Flagyl. She appears to be tolerating these both well. This is for osteomyelitis involving the left calcaneus. The area on the right in the setting of bilateral Charcot deformities did not have any bone infection on MRI. We put a total contact cast on her with silver alginate as the primary dressing and she returns today in follow-up. Per our intake nurse there was too much drainage to leave this on all week therefore we will bring her back for an early change 3/8; follow-up total contact cast she is still having a lot of drainage probably too much drainage from the right foot wound will week with the same cast. She will be back on Thursday. The area on the left looks somewhat better 3/11; patient here for a total contact cast change. 3/15; patient came in for wound care evaluation. She is still on IV vancomycin and oral Flagyl for osteomyelitis in the left foot. The area on the right foot we  are putting in a total contact cast. 3/18 for total contact cast change on the right foot 3/22; the patient was here for wound review today. The area on the right foot is slightly smaller in terms of surface area. However the surface of the wound is very gritty and hyper granulated. More problematically the area on the plantar left foot has increased depth indeed in the [name] of this it probes to bone. We have been using Hydrofera Blue in both areas. She is on antibiotics as prescribed by infectious disease IV vancomycin and oral Flagyl 3/25; the patient is in for total contact cast change. Our intake nurse was concerned about the amount of drainage which soaked right to the multiple layers we are putting on this. Wondered about options. The wound bed actually looks as healthy as this is looked although there may not be a lot of change in dimensions things are looking a  lot better. If we are going to heal this woman's wound which is in the middle of her right Charcot foot this is going to need to be offloaded. There are not many alternatives 3/29; still not a lot of improvement although the surface of the right wound looks better. We have been using Hydrofera Blue 04/17/2019 upon evaluation today patient appears to be doing well with a total contact cast. With actually having to change this twice a week. She saw Dr. Leanord [name] for wound care earlier in the week this is for the cast change today. That is due to the fact that she has significant drainage. Overall the wound is been looking excellent however. 4/5; we continue to put a total contact cast on this patient with Hydrofera Blue. Still a lot of drainage which precludes a simple weekly change or changing this twice a week. 4/8; total contact cast changed today. Apparently the wound looks better per our intake nurse 4/12; patient here for wound care evaluation. Wounds on the right foot have not changed in dimension none about a month left foot looks similar. Apparently her antibiotics that we are giving her for osteomyelitis in the left foot complete on Wednesday. We have been using Hydrofera Blue on the right foot under a total contact cast. Nurses report greenish drainage although I think this may be discoloration from the [facility] [name] 4/15; back for cast change wounds look quite good although still moderate amount of drainage. [name] contact cast reapplied otal 4/19;. Wound evaluation. Wound on the right foot is smaller surface looks healthy. There is still the divot in the middle part of this wound but I could not feel any bone. Area on the left foot also looks better She has completed her antibiotics but still has the PICC line in. 4/23; patient comes back for a total contact cast change this was done in the standard fashion no issues 4/26; wound measures slightly larger on the right foot which is disappointing.  She sees Dr. Orvan Falconer with regards to her antibiotics on Wednesday. I believe she is on vancomycin IV and oral Flagyl. 4/29; in for her routine total contact cast change. She saw Dr. Orvan Falconer this morning. He is asking about antibiotic continuation I will be in touch with him. I did not look at her wound today 5/3; patient saw Dr. Orvan Falconer on 4/28. I did send him a note asking about the continuation of perhaps an oral regimen. I have not heard back. She still has an elevated sedimentation rate at 81. Her wounds are smaller. 5/6; in for her  total contact cast change. We did not look at the wound today. She has seen Dr. Orvan Falconer and is now on oral Keflex and Flagyl. She seems to be tolerating these well 5/10; she had her PICC line removed. The wound on the right foot after some initial improvement has not made any improvement even with the total contact cast. Still having a lot of drainage. Likewise the area on the left foot really is not any better either 5/14; in for a cast change today. We will look at the right foot on Monday. If this is not making any progress I am going to try to cast the left foot. 5/17; the wound on the right foot is absolutely no different. I am going to put ointment on this area and change the cast of the left foot. We have been using silver alginate there 5/21; apparently the area on the right foot was better per our intake nurse although I did not see it. We did a standard total contact cast change on the left foot we have been applying silver alginate to the wound here. 06/10/19-Area on the right foot per dimensions slightly better, the left foot has still some depth applying silver alginate to the wound with TCC 06/13/19-Patient here for Casting to Left foot 6/1; patient had the total contact cast were placed on the left foot. Noted some swelling in her right leg although the dimensions of the wound on the right foot are better. We have been using PolyMem on the right and  silver alginate on the left under the cast 6/7; we continue to make nice progress in both her plantar feet wound. We are putting the total contact cast on the left. Surprisingly in spite of this the area on the right medial foot in the middle of her Charcot deformities actually is making progress as well 07/01/2019 upon evaluation today patient actually appears to be doing quite well with regard to her wounds on the feet compared to when I saw her last that has been quite some time. With that being said I do believe the total contact cast has been beneficial in this left foot and she has a lot of signs of improvement she does have some callus overhanging which is caused a little bit of a ledge and an issue here. I do believe that we may be able to do something to help in that regard. Fortunately otherwise the wound seems to be progressing quite nicely. 6/21; the patient's area on the left foot that we are currently casting is just about closed. The area on the right medial foot has a healthy looking surface but may be not changed as much from last week in terms of surface area. We are using silver alginate on the right and polymen Ag under the cast 6/28; only a small open area on the left foot remains. Using silver alginate under a total contact cast. She has a diabetic shoe I have asked her to bring that for next week where this may be closed. The area on the right foot with a Charcot deformity is also measuring slightly smaller. We are using PolyMem Ag here 7/6; left foot is totally closed. She does not require a total contact cast. She will graduate to her own diabetic shoe. ooThe area on the right foot with a Charcot deformity measuring about the same. We have been using polymen under compression wraps. She did not do well in this area with a total contact cast 7/13; left  foot remains intact. She is skillfully covering this area with foam and using her diabetic shoe. Slight area improvement of the  right foot wound. Still rolled senescent looking edges. We have been using polymen 7/19; left foot remains intact. Wound measuring slightly smaller on the right foot in the middle of the Charcot deformity. We have been using PolyMem Ag 7/26; per the patient left foot remains intact. The wound on the right foot looks slightly smaller but dimensions are measuring the same. We have been using polymen Ag 8/9-Patient back after 2 weeks, wound of the right foot again looking slightly smaller, continuing to use PolyMem silver 8/16; patient has made some minor improvements in wound area on the right foot. Using polymen Ag. The left foot remains healed 8/23; continued slight improvement in dimensions. Rolled edges around the wound edge noted. We have been using polymen Ag. Patient offloading with a surgical shoe and crutches 8/30; no change in surface area. Thick rolled skin and subcutaneous tissue around the edges debris on the wound surface. We have been using polymen Ag initially with some improvement however stalled on the last 2 weeks in terms of surface area 9/13; surface area is smaller still using polymen Ag. Thick rolled skin and subcutaneous tissue around the edges requiring debridement. 9/20; again surface area is improved using polymen Ag however each week she develops thick rolled skin and subcutaneous tissue around the wound edges requiring debridement. She is offloading this using crutches 9/27; we've been using PolyMem AG. Wound does not look any different from last week. Raised edges of callus skin and subcutaneous tissue around the wound bed. 10/5; we been using polymen Ag. No ability to put a total contact cast on today in the clinic. Wound is about the same still rolled edges of thick callus around the margins 10/18; we put her in a total contact cast last week things did not go well. Not only does the wound looked worse macerated angry with thick callus and some warmth around it. She  had another wound above this and one on the anterior right leg. I am not totally certain why this happened. She has not been systemically unwell 10/25; culture from last week grew Pseudomonas and MRSA. My experience with this MRSA is usually a true infection and Pseudomonas a colonizer I am going to put her on doxycycline today we put gentamicin on the wound surface. Continuing with silver alginate 11/1; she has tolerated her antibiotics. I gave her doxycycline. Was still using gentamicin. The original wound looks about the same thick rolled edges. The cast injury just above it is superficial 11/8; even after an aggressive debridement last week the wound on her medial Charcot foot on the right looks about the same. The surface looks satisfactory however rolled edges of callus thick skin and subcutaneous tissue. She did not do well on 2 separate occasions in a total contact cast. The cast abrasion she has on the medial foot is just above closed. We have been using polymen Ag. She is attempting to offload this is much as she can with her crutches 11/15; the wound on the Charcot right foot about the same in terms of surface area.. The area above this on the ankle is healed. 11/29; Charcot right foot wound. This is medially. Wound may be slightly smaller however still raised edges of callus and skin around the wound circumference. We have been using polymen 12/6; Charcot right foot wound. This is medially on the plantar aspect. I did  not debride this today wound looks about the same in a week she is reappeared with raised edges of callus and thick skin around the wound. We have been using polymen no real improvement 12/13; again an extensive debridement today. Thick callus skin and subcutaneous tissue around the wound margin with undermining. We have been using silver collagen since last week 12/20; in spite of the debridement last week the wound really looks no difference. Circumferential callus shallow  wound granulation does not look too bad. We have been using silver collagen really no improvement Objective Constitutional Patient is hypertensive.. Pulse regular and within target range for patient.Marland Kitchen Respirations regular, non-labored and within target range.. Temperature is normal and within the target range for the patient.Marland Kitchen Appears in no distress. Vitals Time Taken: 2:23 PM, Height: 68 in, Weight: 265 lbs, BMI: 40.3, Temperature: 98.1 F, Pulse: 67 bpm, Respiratory Rate: 20 breaths/min, Blood Pressure: 152/83 mmHg. Cardiovascular Pedal pulses are palpable. General Notes: Wound exam; right medial plantar foot. Circumferential callus around the wound. Granulation in the wound area does not look too bad however precisely the same as last week. Integumentary (Hair, Skin) Wound #9 status is Open. Original cause of wound was Blister. The wound is located on the Right,Medial Foot. The wound measures 1.2cm length x 1.4cm width x 0.7cm depth; 1.319cm^2 area and 0.924cm^3 volume. There is Fat Layer (Subcutaneous Tissue) exposed. There is no tunneling or undermining noted. There is a medium amount of serosanguineous drainage noted. The wound margin is thickened. There is medium (34-66%) granulation within the wound bed. There is a medium (34-66%) amount of necrotic tissue within the wound bed including Adherent Slough. 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 There was a Three Layer Compression Therapy Procedure by Zandra Abts, RN. Post procedure Diagnosis Wound #9: Same as Pre-Procedure Plan Follow-up Appointments: Return Appointment in 1  week. Bathing/ Shower/ Hygiene: May shower with protection but do not get wound dressing(s) wet. Edema Control - Lymphedema / SCD / Other: Elevate legs to the level of the heart or above for 30 minutes daily and/or when sitting, a frequency of: - throughout the day Avoid standing for long periods of time. Patient to wear own compression stockings every day. ordered were: Dr. Victorino [name] ([facility]) - Charcot with non healing wound on right foot, discuss possible surgical options WOUND #9: - Foot Wound Laterality: Right, Medial Cleanser: Wound Cleanser 1 x Per Week/Other:wound [name] to change weekly Discharge Instructions: Cleanse the wound with wound cleanser prior to applying a clean dressing using gauze sponges, not tissue or cotton balls. Peri-Wound Care: Sween Lotion (Moisturizing lotion) 1 x Per Week/Other:wound [name] to change weekly Discharge Instructions: Apply moisturizing lotion as directed Prim Dressing: Cutimed Sorbact Swab 1 x Per Week/Other:wound [name] to change weekly ary Discharge Instructions: thin layer of hydrogel over Sorbact Secondary Dressing: ABD Pad, 5x9 1 x Per Week/Other:wound [name] to change weekly Discharge Instructions: Apply over primary dressing as directed. Secondary Dressing: Zetuvit Plus 4x8 in 1 x Per Week/Other:wound [name] to change weekly Discharge Instructions: Apply over primary dressing as directed. Secondary Dressing: Felt 2.5 yds x 5.5 in 1 x Per Week/Other:wound [name] to change weekly Discharge Instructions: Apply over primary dressing  as directed. Compression Wrap: ThreePress (3 layer compression wrap) 1 x Per Week/Other:wound [name] to change weekly Discharge Instructions: Apply three layer compression as directed. 1. I am going to send the patient back to Dr. Victorino [name] to look at this wound on the lateral right foot. I will know if there is something surgical that can be offered to close this. It seems like there is a Charcot deformity/bony  subluxation underneath this. I do not believe that there is any infection 2. We have tried total contact cast on this area twice without any improvement. Reluctant to go down this road again. Electronic Signature(s) Signed: 01/06/2020 7:23:52 PM By: Sarah Najjar MD Entered By: [name] on 01/06/2020 15:32:23 -------------------------------------------------------------------------------- SuperBill Details Patient Name: Date of Service: [name], [name] 01/06/2020 Medical Record Number: [id number] Patient Account Number: [id number] Date of Birth/Sex: Treating RN: 28-Feb-1955 (64 y.o. [name] Primary Care Provider: MA SO [name], Kalona Sarah LSA DA [name] Other Clinician: Referring Provider: Treating Provider/Extender: Abe People, ELHA Sarah LSA DA [name] Weeks in Treatment: 48 Diagnosis Coding ICD-10 Codes Code Description E11.621 Type 2 diabetes mellitus with foot ulcer L97.511 Non-pressure chronic ulcer of other part of right foot limited to breakdown of skin E11.42 Type 2 diabetes mellitus with diabetic polyneuropathy M14.671 Charcot's joint, right ankle and foot Facility Procedures CPT4 Code: [id number] Description: (Facility Use Only) [id number] - APPLY MULTLAY COMPRS LWR RT LEG Modifier: Quantity: 1 Physician Procedures : CPT4 Code Description Modifier [id number] - WC PHYS LEVEL 3 - EST PT ICD-10 Diagnosis Description L97.511 Non-pressure chronic ulcer of other part of right foot limited to breakdown of skin E11.621 Type 2 diabetes mellitus with foot ulcer Quantity: 1 Electronic Signature(s) Signed: 01/06/2020 5:38:15 PM By: Zandra Abts RN, BSN Signed: 01/06/2020 7:23:52 PM By: Sarah Najjar MD Entered By: Zandra Abts on 01/06/2020 17:31:49

## 2020-01-07 NOTE — Progress Notes (Signed)
[name] ([id number]) Visit Report for 01/06/2020 Arrival Information Details Patient Name: Date of Service: [name], [name] 01/06/2020 1:45 PM Medical Record Number: [id number] Patient Account Number: [id number] Date of Birth/Sex: Treating RN: [birth year] (64 y.o. [name] Primary Care Anyeli Hockenbury: MA SO UDI, [place] MA LSA DA T Other Clinician: Referring Tarica Harl: Treating Alin Hutchins/Extender: Baltazar Najjar MA SO UDI, ELHA MA LSA DA T Weeks in Treatment: 69 Visit Information History Since Last Visit Added or deleted any medications: No Patient Arrived: Crutches Any new allergies or adverse reactions: No Arrival Time: 14:23 Had a fall or experienced change in No Accompanied By: self activities of daily living that may affect Transfer Assistance: None risk of falls: Patient Identification Verified: Yes Signs or symptoms of abuse/neglect since last visito No Secondary Verification Process Completed: Yes Hospitalized since last visit: No Patient Requires Transmission-Based Precautions: No Implantable device outside of the clinic excluding No Patient Has Alerts: No cellular tissue based products placed in the center since last visit: Has Dressing in Place as Prescribed: Yes Pain Present Now: No Electronic Signature(s) Signed: 01/07/2020 2:20:57 PM By: Karl Ito Entered By: Karl Ito on 01/06/2020 14:23:48 -------------------------------------------------------------------------------- Compression Therapy Details Patient Name: Date of Service: ENO Sarah Reaper, [name] 01/06/2020 1:45 PM Medical Record Number: [id number] Patient Account Number: [id number] Date of Birth/Sex: Treating RN: 1955/02/13 (64 y.o. [name] Primary Care Eveleen Mcnear: MA SO UDI, Judene Companion MA LSA DA T Other Clinician: Referring Chike Farrington: Treating Evalyn Shultis/Extender: Baltazar Najjar MA SO UDI, ELHA MA LSA DA T Weeks in Treatment: 37 Compression Therapy Performed for Wound  Assessment: Wound #9 Right,Medial Foot Performed By: Clinician Zandra Abts, RN Compression Type: Three Layer Post Procedure Diagnosis Same as Pre-procedure Electronic Signature(s) Signed: 01/06/2020 5:38:15 PM By: Zandra Abts RN, BSN Entered By: Zandra Abts on 01/06/2020 15:04:32 -------------------------------------------------------------------------------- Encounter Discharge Information Details Patient Name: Date of Service: [facility], [name] 01/06/2020 1:45 PM Medical Record Number: [id number] Patient Account Number: [id number] Date of Birth/Sex: Treating RN: 08-09-1955 (64 y.o. [name] Primary Care Loden Laurent: MA SO UDI, Judene Companion MA LSA DA T Other Clinician: Referring Zackerie Sara: Treating Nashay Brickley/Extender: Baltazar Najjar MA SO UDI, ELHA MA LSA DA T Weeks in Treatment: 6 Encounter Discharge Information Items Discharge Condition: Stable Ambulatory Status: Crutches Discharge Destination: Home Transportation: Private Auto Accompanied By: self Schedule Follow-up Appointment: Yes Clinical Summary of Care: Electronic Signature(s) Signed: 01/06/2020 5:23:06 PM By: Shawn Stall Entered By: Shawn Stall on 01/06/2020 15:54:58 -------------------------------------------------------------------------------- Lower Extremity Assessment Details Patient Name: Date of Service: [name] 01/06/2020 1:45 PM Medical Record Number: [id number] Patient Account Number: [id number] Date of Birth/Sex: Treating RN: March 02, 1955 (64 y.o. [name] Primary Care Stavroula Rohde: MA SO UDI, [place] MA LSA DA T Other Clinician: Referring Onda Kattner: Treating Brionne Mertz/Extender: Baltazar Najjar MA SO UDI, ELHA MA LSA DA T Weeks in Treatment: 49 Edema Assessment Assessed: [Left: No] [Right: No] Edema: [Left: N] [Right: o] Calf Left: Right: Point of Measurement: 43 cm From Medial Instep 33 cm Ankle Left: Right: Point of Measurement: 12 cm From Medial Instep 21  cm Electronic Signature(s) Signed: 01/06/2020 5:24:04 PM By: Yevonne Pax RN Entered By: Yevonne Pax on 01/06/2020 14:39:25 -------------------------------------------------------------------------------- Multi Wound Chart Details Patient Name: Date of Service: Sarah Boss, Clementine J. 01/06/2020 1:45 PM Medical Record Number: [id number] Patient Account Number: [id number] Date of Birth/Sex: Treating RN: 1956/01/09 (64 y.o. [name] Primary Care Devesh Monforte: MA Hedy Jacob MA LSA DA T Other Clinician: Referring Spring San: Treating Kanika Bungert/Extender: Baltazar Najjar MA  SO UDI, ELHA MA LSA DA T Weeks in Treatment: 49 Vital Signs Height(in): 68 Pulse(bpm): 67 Weight(lbs): 265 Blood Pressure(mmHg): 152/83 Body Mass Index(BMI): 40 Temperature(F): 98.1 Respiratory Rate(breaths/min): 20 Photos: [9:No Photos Right, Medial Foot] [N/A:N/A N/A] Wound Location: [9:Blister] [N/A:N/A] Wounding Event: [9:Diabetic Wound/Ulcer of the Lower] [N/A:N/A] Primary Etiology: [9:Extremity Glaucoma, Chronic sinus] [N/A:N/A] Comorbid History: [9:problems/congestion, Anemia, Sleep Apnea, Hypertension, Type II Diabetes, Rheumatoid Arthritis, Neuropathy 06/18/2018] [N/A:N/A] Date Acquired: [9:49] [N/A:N/A] Weeks of Treatment: [9:Open] [N/A:N/A] Wound Status: [9:1.2x1.4x0.7] [N/A:N/A] Measurements L x W x D (cm) [9:1.319] [N/A:N/A] A (cm) : rea [9:0.924] [N/A:N/A] Volume (cm) : [9:88.00%] [N/A:N/A] % Reduction in A rea: [9:90.70%] [N/A:N/A] % Reduction in Volume: [9:Grade 2] [N/A:N/A] Classification: [9:Medium] [N/A:N/A] Exudate A mount: [9:Serosanguineous] [N/A:N/A] Exudate Type: [9:red, brown] [N/A:N/A] Exudate Color: [9:Thickened] [N/A:N/A] Wound Margin: [9:Medium (34-66%)] [N/A:N/A] Granulation A mount: [9:Medium (34-66%)] [N/A:N/A] Necrotic A mount: [9:Fat Layer (Subcutaneous Tissue): Yes N/A] Exposed Structures: [9:Fascia: No Tendon: No Muscle: No Joint: No Bone: No None]  [N/A:N/A] Epithelialization: [9:Compression Therapy] [N/A:N/A] Treatment Notes Electronic Signature(s) Signed: 01/06/2020 5:38:15 PM By: Zandra Abts RN, BSN Signed: 01/06/2020 7:23:52 PM By: Baltazar Najjar MD Entered By: Baltazar Najjar on 01/06/2020 15:28:50 -------------------------------------------------------------------------------- Multi-Disciplinary Care Plan Details Patient Name: Date of Service: [facility], Shannell J. 01/06/2020 1:45 PM Medical Record Number: [id number] Patient Account Number: [id number] Date of Birth/Sex: Treating RN: 02/26/1955 (64 y.o. [name] Primary Care Dejaun Vidrio: MA SO UDI, Judene Companion MA LSA DA T Other Clinician: Referring Remas Sobel: Treating Leannah Guse/Extender: Baltazar Najjar MA SO UDI, ELHA MA LSA DA T Weeks in Treatment: 42 Active Inactive Wound/Skin Impairment Nursing Diagnoses: Impaired tissue integrity Knowledge deficit related to ulceration/compromised skin integrity Goals: Patient/caregiver will verbalize understanding of skin care regimen Date Initiated: 01/28/2019 Target Resolution Date: 01/24/2020 Goal Status: Active Interventions: Assess patient/caregiver ability to obtain necessary supplies Assess patient/caregiver ability to perform ulcer/skin care regimen upon admission and as needed Assess ulceration(s) every visit Provide education on ulcer and skin care Notes: Electronic Signature(s) Signed: 01/06/2020 5:38:15 PM By: Zandra Abts RN, BSN Entered By: Zandra Abts on 01/06/2020 14:43:49 -------------------------------------------------------------------------------- Pain Assessment Details Patient Name: Date of Service: Sarah Boss, Haiden J. 01/06/2020 1:45 PM Medical Record Number: [id number] Patient Account Number: [id number] Date of Birth/Sex: Treating RN: 1955-08-09 (64 y.o. [name] Primary Care Lenore Moyano: MA SO UDI, [place] MA LSA DA T Other Clinician: Referring Yaret Hush: Treating Natajah Derderian/Extender:  Baltazar Najjar MA SO UDI, ELHA MA LSA DA T Weeks in Treatment: 27 Active Problems Location of Pain Severity and Description of Pain Patient Has Paino No Site Locations Pain Management and Medication Current Pain Management: Electronic Signature(s) Signed: 01/06/2020 5:38:15 PM By: Zandra Abts RN, BSN Signed: 01/07/2020 2:20:57 PM By: Karl Ito Entered By: Karl Ito on 01/06/2020 14:24:15 -------------------------------------------------------------------------------- Patient/Caregiver Education Details Patient Name: Date of Service: [facility] 12/20/2021andnbsp1:45 PM Medical Record Number: [id number] Patient Account Number: [id number] Date of Birth/Gender: Treating RN: 01/25/1955 (64 y.o. [name] Primary Care Physician: MA SO UDI, Judene Companion MA LSA DA T Other Clinician: Referring Physician: Treating Physician/Extender: Baltazar Najjar MA SO UDI, ELHA MA LSA DA T Weeks in Treatment: 15 Education Assessment Education Provided To: Patient Education Topics Provided Wound/Skin Impairment: Methods: Explain/Verbal Responses: State content correctly [employer]) Signed: 01/06/2020 5:38:15 PM By: Zandra Abts RN, BSN Entered By: Zandra Abts on 01/06/2020 14:44:07 -------------------------------------------------------------------------------- Wound Assessment Details Patient Name: Date of Service: ENO Sarah Reaper, Loraina J. 01/06/2020 1:45 PM Medical Record Number: [id number] Patient Account Number: [id number] Date of Birth/Sex: Treating  RN: 04-08-1955 (64 y.o. [name] Primary Care Eh Sesay: MA SO UDI, [place] MA LSA DA T Other Clinician: Referring Waylon Hershey: Treating Tanganika Barradas/Extender: Baltazar Najjar MA SO UDI, ELHA MA LSA DA T Weeks in Treatment: 31 Wound Status Wound Number: 9 Primary Diabetic Wound/Ulcer of the Lower Extremity Etiology: Wound Location: Right, Medial Foot Wound Open Wounding Event:  Blister Status: Date Acquired: 06/18/2018 Comorbid Glaucoma, Chronic sinus problems/congestion, Anemia, Sleep Weeks Of Treatment: 49 History: Apnea, Hypertension, Type II Diabetes, Rheumatoid Arthritis, Clustered Wound: No Neuropathy Photos Photo Uploaded By: Benjaman Kindler on 01/07/2020 13:14:37 Wound Measurements Length: (cm) 1.2 Width: (cm) 1.4 Depth: (cm) 0.7 Area: (cm) 1.319 Volume: (cm) 0.924 % Reduction in Area: 88% % Reduction in Volume: 90.7% Epithelialization: None Tunneling: No Undermining: No Wound Description Classification: Grade 2 Wound Margin: Thickened Exudate Amount: Medium Exudate Type: Serosanguineous Exudate Color: red, brown Foul Odor After Cleansing: No Slough/Fibrino Yes Wound Bed Granulation Amount: Medium (34-66%) Exposed Structure Necrotic Amount: Medium (34-66%) Fascia Exposed: No Necrotic Quality: Adherent Slough Fat Layer (Subcutaneous Tissue) Exposed: Yes Tendon Exposed: No Muscle Exposed: No Joint Exposed: No Bone Exposed: No Treatment Notes Wound #9 (Foot) Wound Laterality: Right, Medial Cleanser Wound Cleanser Discharge Instruction: Cleanse the wound with wound cleanser prior to applying a clean dressing using gauze sponges, not tissue or cotton balls. Peri-Wound Care Sween Lotion (Moisturizing lotion) Discharge Instruction: Apply moisturizing lotion as directed Topical Primary Dressing Cutimed Sorbact Swab Discharge Instruction: thin layer of hydrogel over Sorbact Secondary Dressing ABD Pad, 5x9 Discharge Instruction: Apply over primary dressing as directed. Zetuvit Plus 4x8 in Discharge Instruction: Apply over primary dressing as directed. Felt 2.5 yds x 5.5 in Discharge Instruction: Apply over primary dressing as directed. Secured With Compression Wrap ThreePress (3 layer compression wrap) Discharge Instruction: Apply three layer compression as directed. Compression Stockings Add-Ons Electronic Signature(s) Signed:  01/06/2020 5:24:04 PM By: Yevonne Pax RN Signed: 01/06/2020 5:38:15 PM By: Zandra Abts RN, BSN Entered By: Yevonne Pax on 01/06/2020 14:39:57 -------------------------------------------------------------------------------- Vitals Details Patient Name: Date of Service: [name], Gradie J. 01/06/2020 1:45 PM Medical Record Number: [id number] Patient Account Number: [id number] Date of Birth/Sex: Treating RN: [birth year] (64 y.o. [name] Primary Care Karlene Southard: MA SO UDI, [place] MA LSA DA T Other Clinician: Referring Ivette Castronova: Treating Dania Marsan/Extender: Baltazar Najjar MA SO UDI, ELHA MA LSA DA T Weeks in Treatment: 18 Vital Signs Time Taken: 14:23 Temperature (F): 98.1 Height (in): 68 Pulse (bpm): 67 Weight (lbs): 265 Respiratory Rate (breaths/min): 20 Body Mass Index (BMI): 40.3 Blood Pressure (mmHg): 152/83 Reference Range: 80 - 120 mg / dl Electronic Signature(s) Signed: 01/07/2020 2:20:57 PM By: Karl Ito Entered By: Karl Ito on 01/06/2020 14:24:05

## 2020-01-13 ENCOUNTER — Encounter (HOSPITAL_BASED_OUTPATIENT_CLINIC_OR_DEPARTMENT_OTHER): Payer: Medicare Other | Admitting: Internal Medicine

## 2020-01-14 ENCOUNTER — Encounter (HOSPITAL_BASED_OUTPATIENT_CLINIC_OR_DEPARTMENT_OTHER): Payer: Medicare Other | Admitting: Internal Medicine

## 2020-01-15 ENCOUNTER — Encounter (HOSPITAL_BASED_OUTPATIENT_CLINIC_OR_DEPARTMENT_OTHER): Payer: Medicare Other | Admitting: Physician Assistant

## 2020-01-15 ENCOUNTER — Other Ambulatory Visit: Payer: Self-pay

## 2020-01-15 DIAGNOSIS — E11621 Type 2 diabetes mellitus with foot ulcer: Secondary | ICD-10-CM | POA: Diagnosis not present

## 2020-01-15 NOTE — Progress Notes (Signed)
RAEGYN, RENDA ([id number]) Visit Report for 01/15/2020 Arrival Information Details Patient Name: Date of Service: [name], [name] 01/15/2020 1:00 PM Medical Record Number: [id number] Patient Account Number: [id number] Date of Birth/Sex: Treating RN: 1955/11/21 (64 y.o. [name] Primary Care Melysa Schroyer: MA SO UDI, [place] MA LSA DA T Other Clinician: Referring Dayson Aboud: Treating Veleta Yamamoto/Extender: Lenda Kelp MA SO UDI, ELHA MA LSA DA T Weeks in Treatment: 50 Visit Information History Since Last Visit Added or deleted any medications: No Patient Arrived: Crutches Any new allergies or adverse reactions: No Arrival Time: 13:04 Had a fall or experienced change in No Accompanied By: self activities of daily living that may affect Transfer Assistance: None risk of falls: Patient Identification Verified: Yes Signs or symptoms of abuse/neglect since last visito No Secondary Verification Process Completed: Yes Hospitalized since last visit: No Patient Requires Transmission-Based Precautions: No Implantable device outside of the clinic excluding No Patient Has Alerts: No cellular tissue based products placed in the [name] since last visit: Has Dressing in Place as Prescribed: Yes Has Compression in Place as Prescribed: Yes Pain Present Now: No Electronic Signature(s) Signed: 01/15/2020 2:08:48 PM By: Shawn Stall Entered By: Shawn Stall on 01/15/2020 13:09:59 -------------------------------------------------------------------------------- Compression Therapy Details Patient Name: Date of Service: [name], [name] 01/15/2020 1:00 PM Medical Record Number: [id number] Patient Account Number: [id number] Date of Birth/Sex: Treating RN: [birth year] (64 y.o. [name] Primary Care Bettye Sitton: MA SO UDI, [place] MA LSA DA T Other Clinician: Referring Dorissa Stinnette: Treating Ajna Moors/Extender: Lenda Kelp MA SO UDI, ELHA MA LSA DA T Weeks in Treatment: 50 Compression  Therapy Performed for Wound Assessment: Wound #9 Right,Medial Foot Performed By: Clinician Shawn Stall, RN Compression Type: Three Layer Electronic Signature(s) Signed: 01/15/2020 2:08:48 PM By: Shawn Stall Entered By: Shawn Stall on 01/15/2020 13:11:28 -------------------------------------------------------------------------------- Encounter Discharge Information Details Patient Name: Date of Service: [name], [name] 01/15/2020 1:00 PM Medical Record Number: [id number] Patient Account Number: [id number] Date of Birth/Sex: Treating RN: 03/06/1955 (65 y.o. [name] Primary Care Deveron Shamoon: MA SO UDI, [place] MA LSA DA T Other Clinician: Referring Giovanie Lefebre: Treating Kirsi Hugh/Extender: Lenda Kelp MA SO UDI, ELHA MA LSA DA T Weeks in Treatment: 19 Encounter Discharge Information Items Discharge Condition: Stable Ambulatory Status: Crutches Discharge Destination: Home Transportation: Private Auto Accompanied By: self Schedule Follow-up Appointment: Yes Clinical Summary of Care: Patient Declined Electronic Signature(s) Signed: 01/15/2020 2:08:48 PM By: Shawn Stall Entered By: Shawn Stall on 01/15/2020 13:13:25 -------------------------------------------------------------------------------- Patient/Caregiver Education Details Patient Name: Date of Service: [name] 12/29/2021andnbsp1:00 PM Medical Record Number: [id number] Patient Account Number: [id number] Date of Birth/Gender: Treating RN: Sep 28, 1955 (64 y.o. [name] Primary Care Physician: MA SO UDI, ELHA MA LSA DA T Other Clinician: Referring Physician: Treating Physician/Extender: Lenda Kelp MA SO UDI, ELHA MA LSA DA T Weeks in Treatment: 62 Education Assessment Education Provided To: Patient Education Topics Provided Wound/Skin Impairment: Methods: Explain/Verbal Responses: State content correctly Electronic Signature(s) Signed: 01/15/2020 2:08:48 PM By: Shawn Stall Entered By: Shawn Stall on 01/15/2020 13:12:05 -------------------------------------------------------------------------------- Wound Assessment Details Patient Name: Date of Service: [name], [name] 01/15/2020 1:00 PM Medical Record Number: [id number] Patient Account Number: [id number] Date of Birth/Sex: Treating RN: February 06, 1955 (64 y.o. [name] Primary Care Brennin Durfee: MA SO UDI, [place] MA LSA DA T Other Clinician: Referring Micai Apolinar: Treating Eilyn Polack/Extender: Lenda Kelp MA SO UDI, ELHA MA LSA DA T Weeks in Treatment: 50 Wound Status Wound Number: 9 Primary Etiology: Diabetic  Wound/Ulcer of the Lower Extremity Wound Location: Right, Medial Foot Wound Status: Open Wounding Event: Blister Date Acquired: 06/18/2018 Weeks Of Treatment: 50 Clustered Wound: No Wound Measurements Length: (cm) 1.2 Width: (cm) 1.4 Depth: (cm) 0.7 Area: (cm) 1.319 Volume: (cm) 0.924 % Reduction in Area: 88% % Reduction in Volume: 90.7% Wound Description Classification: Grade 2 Treatment Notes Wound #9 (Foot) Wound Laterality: Right, Medial Cleanser Wound Cleanser Discharge Instruction: Cleanse the wound with wound cleanser prior to applying a clean dressing using gauze sponges, not tissue or cotton balls. Peri-Wound Care Sween Lotion (Moisturizing lotion) Discharge Instruction: Apply moisturizing lotion as directed Topical Primary Dressing Cutimed Sorbact Swab Discharge Instruction: thin layer of hydrogel over Sorbact Secondary Dressing ABD Pad, 5x9 Discharge Instruction: Apply over primary dressing as directed. Zetuvit Plus 4x8 in Discharge Instruction: Apply over primary dressing as directed. Felt 2.5 yds x 5.5 in Discharge Instruction: Apply over primary dressing as directed. Secured With Compression Wrap ThreePress (3 layer compression wrap) Discharge Instruction: Apply three layer compression as directed. Compression Stockings Add-Ons Electronic  Signature(s) Signed: 01/15/2020 2:08:48 PM By: Shawn Stall Entered By: Shawn Stall on 01/15/2020 13:10:44 -------------------------------------------------------------------------------- Vitals Details Patient Name: Date of Service: [name], [name] 01/15/2020 1:00 PM Medical Record Number: [id number] Patient Account Number: [id number] Date of Birth/Sex: Treating RN: [birth year] (64 y.o. [name] Primary Care Taysen Bushart: MA SO UDI, ELHA MA LSA DA T Other Clinician: Referring Venesa Semidey: Treating Timothea Bodenheimer/Extender: Lenda Kelp MA SO UDI, ELHA MA LSA DA T Weeks in Treatment: 50 Vital Signs Time Taken: 13:10 Temperature (F): 98.4 Height (in): 68 Pulse (bpm): 64 Weight (lbs): 265 Respiratory Rate (breaths/min): 18 Body Mass Index (BMI): 40.3 Blood Pressure (mmHg): 149/83 Reference Range: 80 - 120 mg / dl Electronic Signature(s) Signed: 01/15/2020 2:08:48 PM By: Shawn Stall Entered By: Shawn Stall on 01/15/2020 13:10:31

## 2020-01-20 ENCOUNTER — Encounter (HOSPITAL_BASED_OUTPATIENT_CLINIC_OR_DEPARTMENT_OTHER): Payer: Medicare Other | Admitting: Internal Medicine

## 2020-01-21 NOTE — Progress Notes (Signed)
ARENA, LINDAHL ([id number]) Visit Report for 01/15/2020 SuperBill Details Patient Name: Date of Service: [name], [name] 01/15/2020 Medical Record Number: [id number] Patient Account Number: [id number] Date of Birth/Sex: Treating RN: July 08, 1955 (65 y.o. 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

## 2020-01-27 ENCOUNTER — Other Ambulatory Visit: Payer: Self-pay

## 2020-01-27 ENCOUNTER — Encounter (HOSPITAL_BASED_OUTPATIENT_CLINIC_OR_DEPARTMENT_OTHER): Payer: Medicare Other | Attending: Internal Medicine | Admitting: Internal Medicine

## 2020-01-27 DIAGNOSIS — I129 Hypertensive chronic kidney disease with stage 1 through stage 4 chronic kidney disease, or unspecified chronic kidney disease: Secondary | ICD-10-CM | POA: Diagnosis not present

## 2020-01-27 DIAGNOSIS — E1142 Type 2 diabetes mellitus with diabetic polyneuropathy: Secondary | ICD-10-CM | POA: Insufficient documentation

## 2020-01-27 DIAGNOSIS — E1122 Type 2 diabetes mellitus with diabetic chronic kidney disease: Secondary | ICD-10-CM | POA: Insufficient documentation

## 2020-01-27 DIAGNOSIS — M14671 Charcot's joint, right ankle and foot: Secondary | ICD-10-CM | POA: Insufficient documentation

## 2020-01-27 DIAGNOSIS — E11621 Type 2 diabetes mellitus with foot ulcer: Secondary | ICD-10-CM | POA: Insufficient documentation

## 2020-01-27 DIAGNOSIS — N183 Chronic kidney disease, stage 3 unspecified: Secondary | ICD-10-CM | POA: Insufficient documentation

## 2020-01-27 DIAGNOSIS — Z8614 Personal history of Methicillin resistant Staphylococcus aureus infection: Secondary | ICD-10-CM | POA: Diagnosis not present

## 2020-01-27 DIAGNOSIS — L97511 Non-pressure chronic ulcer of other part of right foot limited to breakdown of skin: Secondary | ICD-10-CM | POA: Insufficient documentation

## 2020-01-30 NOTE — Progress Notes (Signed)
[name], [name] ([id number]) Visit Report for 01/27/2020 HPI Details Patient Name: Date of Service: [name], [name] 01/27/2020 1:30 PM Medical Record Number: [id number] Patient Account Number: [id number] Date of Birth/Sex: Treating RN: 10-Feb-1955 (65 y.o. [name] Primary Care Provider: MA SO UDI, [place] MA LSA DA T Other Clinician: Referring Provider: Treating Provider/Extender: Baltazar Najjar MA SO UDI, ELHA MA LSA DA T Weeks in Treatment: 51 History of Present Illness HPI Description: 65 yrs old bf here for follow up recurrent left charcot foot ulcer. She has DM. She is not a smoker. She states a blister developed 3 months ago at site of previous breakdown from 1 year ago. It was debrided at the podiatrist's office. She went to the ER and then sent here. She denies pain. Xray was negative for osteo. Culture from this clinic negative. 09/08/14 [name] to [name] for Charcot foot. Seen by him and MRI scheduled 09/19/14. Prior silver collagen but this was not ordered last visit, patient has been rinsing with saline alone. Re institute silver collagen every other day. F/u 2 weeks with [name] as Labor Day holiday. Supplies ordered. 09/25/14; this is a patient with a Charcot foot on the left. she has a wound over the plantar aspect of her left calcaneus. She has been seen by [name] of orthopedic surgery and has had an MRI. She is apparently going within the next week or 2 for corrective surgery which will involve an external fixator. I don't have any of the details of this. 10/06/14; The patient is a 65 yrs old bf with a left Charcot foot and ulcer on the plantar. 12/01/14 Returns post surgery with [name]. Has follow up visit later this week with him, currently in facility and NWB over this extremity. States no drainage from foot and no current wound care. On exam callus and scab in place, suspect healed. Has external fixator in place. Will defer to Dr.  [name] for management, ok to place moisturizing cream over scabs and callus and she will call if she requires follow up here. READMISSION 01/28/2019 Patient was in the clinic in 2016 for a wound on the plantar aspect of her left calcaneus. Predominantly cared for by [name] she was referred to [name] and had corrective surgery for the position of her left ankle I believe. She had previously been here in 2015 and then prior to that in 2011 2012 that I do not have these records. More recently she has developed fairly extensive wounds on the right medial foot, left lateral foot and a small area on the right medial great toe. Right medial foot wound has been there for 6 to 7 months, left foot wound for 2 to 3 months and the right great toe only over the last few weeks. She has been followed by [name] at Dr. Laverta Baltimore office it sounds as though she has been undergoing callus paring and silver nitrate. The patient has been washing these off with soap and water and plan applying dry dressing Past medical history, type 2 diabetes well controlled with a recent hemoglobin A1c of 7 on 11/16, type 2 diabetes with peripheral neuropathy, previous left Charcot joint surgery bilateral Charcot joints currently, stage III chronic renal failure, hypertension, obstructive sleep apnea, history of MRSA and gastroesophageal reflux. 1/18; this is a patient with bilateral Charcot feet. Plain x-rays that I did of both of these wounds that are either at bone or precariously close to bone  were done. On the right foot this showed possible lytic destruction involving the anterior aspect of the talus which may represent a wound osteomyelitis. An MRI was recommended. On the left there was no definite lytic destruction although the wound is deep undermining's and I think probably requires an MRI on this basis in any case 1/25; patient was supposed to have her MRI last Friday of her bilateral feet however they would  not do it because there was silver alginate in her dressings, will replace with calcium alginate today and will see if we can get her done today 2/1; patient did not get her MRIs done last week because of issues with the MRI department receiving orders to do bilateral feet. She has 2 deep wounds in the setting of Charcot feet deformities bilaterally. Both of them at one point had exposed bone although I had trouble demonstrating that today. I am not sure that we can offload these very aggressively as I watched her leave the clinic last week her gait is very narrow and unsteady. 2/15; the MRI of her right foot did not show osteomyelitis potentially osteonecrosis of the talus. 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 The wounds are a large wound on the right medial foot and on the left a substantial wound on the left lateral plantar calcaneus. We have been using silver alginate. Ultimately I think we need to consider a total contact cast on the right while we work through the possibility of osteomyelitis in the left. I watched her walk out with her crutches I have some trepidation about the ability to walk with the cast on her right foot. Nevertheless with her Charcot deformities I do not think there is a way to heal these short of offloading them aggressively. 2/22; the culture of the bone in the left foot that I did last week showed a few Citrobacter Koseri as well as some streptococcal species. In my attempted bone for pathology did not actually yield a lot of bone the pathologist did not identify any osteomyelitis. 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 Is promised today and aggressive debridement of the right foot and a total contact cast which surprisingly she seemed to tolerate quite well 03/13/2019  upon evaluation today patient appears to be doing well with her total contact cast she is here for the first cast change. She had no areas of rubbing or any other complications at this point. Overall things are doing quite well. 2/26; the patient was kindly seen by Dr. Megan Salon of infectious disease on 2/25. She is now on IV vancomycin PICC line placement in 2 days and oral Flagyl. This was in response to her MRI showing osteomyelitis of the left calcaneus concerning for osteomyelitis. We have been putting her right leg in a total contact cast. Our nurses report drainage. She is tolerating the total contact cast well. 3/4; the patient is started her IV antibiotics and is taking IV vancomycin and oral Flagyl. She appears to be tolerating these both well. This is for osteomyelitis involving the left calcaneus. The area on the right in the setting of bilateral Charcot deformities did not have any bone infection on MRI. We put a total contact cast on her with silver alginate as the primary dressing and she returns today in follow-up. Per  our intake nurse there was too much drainage to leave this on all week therefore we will bring her back for an early change 3/8; follow-up total contact cast she is still having a lot of drainage probably too much drainage from the right foot wound will week with the same cast. She will be back on Thursday. The area on the left looks somewhat better 3/11; patient here for a total contact cast change. 3/15; patient came in for wound care evaluation. She is still on IV vancomycin and oral Flagyl for osteomyelitis in the left foot. The area on the right foot we are putting in a total contact cast. 3/18 for total contact cast change on the right foot 3/22; the patient was here for wound review today. The area on the right foot is slightly smaller in terms of surface area. However the surface of the wound is very gritty and hyper granulated. More problematically the area on  the plantar left foot has increased depth indeed in the [name] of this it probes to bone. We have been using Hydrofera Blue in both areas. She is on antibiotics as prescribed by infectious disease IV vancomycin and oral Flagyl 3/25; the patient is in for total contact cast change. Our intake nurse was concerned about the amount of drainage which soaked right to the multiple layers we are putting on this. Wondered about options. The wound bed actually looks as healthy as this is looked although there may not be a lot of change in dimensions things are looking a lot better. If we are going to heal this woman's wound which is in the middle of her right Charcot foot this is going to need to be offloaded. There are not many alternatives 3/29; still not a lot of improvement although the surface of the right wound looks better. We have been using Hydrofera Blue 04/17/2019 upon evaluation today patient appears to be doing well with a total contact cast. With actually having to change this twice a week. She saw Dr. Dellia Nims for wound care earlier in the week this is for the cast change today. That is due to the fact that she has significant drainage. Overall the wound is been looking excellent however. 4/5; we continue to put a total contact cast on this patient with Hydrofera Blue. Still a lot of drainage which precludes a simple weekly change or changing this twice a week. 4/8; total contact cast changed today. Apparently the wound looks better per our intake nurse 4/12; patient here for wound care evaluation. Wounds on the right foot have not changed in dimension none about a month left foot looks similar. Apparently her antibiotics that we are giving her for osteomyelitis in the left foot complete on Wednesday. We have been using Hydrofera Blue on the right foot under a total contact cast. Nurses report greenish drainage although I think this may be discoloration from the [facility] 4/15; back for cast  change wounds look quite good although still moderate amount of drainage. T contact cast reapplied otal 4/19;. Wound evaluation. Wound on the right foot is smaller surface looks healthy. There is still the divot in the middle part of this wound but I could not feel any bone. Area on the left foot also looks better She has completed her antibiotics but still has the PICC line in. 4/23; patient comes back for a total contact cast change this was done in the standard fashion no issues 4/26; wound measures slightly larger on the right  foot which is disappointing. She sees Dr. Megan Salon with regards to her antibiotics on Wednesday. I believe she is on vancomycin IV and oral Flagyl. 4/29; in for her routine total contact cast change. She saw Dr. Megan Salon this morning. He is asking about antibiotic continuation I will be in touch with him. I did not look at her wound today 5/3; patient saw Dr. Megan Salon on 4/28. I did send him a note asking about the continuation of perhaps an oral regimen. I have not heard back. She still has an elevated sedimentation rate at 81. Her wounds are smaller. 5/6; in for her total contact cast change. We did not look at the wound today. She has seen Dr. Megan Salon and is now on oral Keflex and Flagyl. She seems to be tolerating these well 5/10; she had her PICC line removed. The wound on the right foot after some initial improvement has not made any improvement even with the total contact cast. Still having a lot of drainage. Likewise the area on the left foot really is not any better either 5/14; in for a cast change today. We will look at the right foot on Monday. If this is not making any progress I am going to try to cast the left foot. 5/17; the wound on the right foot is absolutely no different. I am going to put ointment on this area and change the cast of the left foot. We have been using silver alginate there 5/21; apparently the area on the right foot was better per our  intake nurse although I did not see it. We did a standard total contact cast change on the left foot we have been applying silver alginate to the wound here. 06/10/19-Area on the right foot per dimensions slightly better, the left foot has still some depth applying silver alginate to the wound with TCC 06/13/19-Patient here for Casting to Left foot 6/1; patient had the total contact cast were placed on the left foot. Noted some swelling in her right leg although the dimensions of the wound on the right foot are better. We have been using PolyMem on the right and silver alginate on the left under the cast 6/7; we continue to make nice progress in both her plantar feet wound. We are putting the total contact cast on the left. Surprisingly in spite of this the area on the right medial foot in the middle of her Charcot deformities actually is making progress as well 07/01/2019 upon evaluation today patient actually appears to be doing quite well with regard to her wounds on the feet compared to when I saw her last that has been quite some time. With that being said I do believe the total contact cast has been beneficial in this left foot and she has a lot of signs of improvement she does have some callus overhanging which is caused a little bit of a ledge and an issue here. I do believe that we may be able to do something to help in that regard. Fortunately otherwise the wound seems to be progressing quite nicely. 6/21; the patient's area on the left foot that we are currently casting is just about closed. The area on the right medial foot has a healthy looking surface but may be not changed as much from last week in terms of surface area. We are using silver alginate on the right and polymen Ag under the cast 6/28; only a small open area on the left foot remains. Using silver alginate  under a total contact cast. She has a diabetic shoe I have asked her to bring that for next week where this may be closed.  The area on the right foot with a Charcot deformity is also measuring slightly smaller. We are using PolyMem Ag here 7/6; left foot is totally closed. She does not require a total contact cast. She will graduate to her own diabetic shoe. The area on the right foot with a Charcot deformity measuring about the same. We have been using polymen under compression wraps. She did not do well in this area with a total contact cast 7/13; left foot remains intact. She is skillfully covering this area with foam and using her diabetic shoe. Slight area improvement of the right foot wound. Still rolled senescent looking edges. We have been using polymen 7/19; left foot remains intact. Wound measuring slightly smaller on the right foot in the middle of the Charcot deformity. We have been using PolyMem Ag 7/26; per the patient left foot remains intact. The wound on the right foot looks slightly smaller but dimensions are measuring the same. We have been using polymen Ag 8/9-Patient back after 2 weeks, wound of the right foot again looking slightly smaller, continuing to use PolyMem silver 8/16; patient has made some minor improvements in wound area on the right foot. Using polymen Ag. The left foot remains healed 8/23; continued slight improvement in dimensions. Rolled edges around the wound edge noted. We have been using polymen Ag. Patient offloading with a surgical shoe and crutches 8/30; no change in surface area. Thick rolled skin and subcutaneous tissue around the edges debris on the wound surface. We have been using polymen Ag initially with some improvement however stalled on the last 2 weeks in terms of surface area 9/13; surface area is smaller still using polymen Ag. Thick rolled skin and subcutaneous tissue around the edges requiring debridement. 9/20; again surface area is improved using polymen Ag however each week she develops thick rolled skin and subcutaneous tissue around the wound  edges requiring debridement. She is offloading this using crutches 9/27; we've been using PolyMem AG. Wound does not look any different from last week. Raised edges of callus skin and subcutaneous tissue around the wound bed. 10/5; we been using polymen Ag. No ability to put a total contact cast on today in the clinic. Wound is about the same still rolled edges of thick callus around the margins 10/18; we put her in a total contact cast last week things did not go well. Not only does the wound looked worse macerated angry with thick callus and some warmth around it. She had another wound above this and one on the anterior right leg. I am not totally certain why this happened. She has not been systemically unwell 10/25; culture from last week grew Pseudomonas and MRSA. My experience with this MRSA is usually a true infection and Pseudomonas a colonizer I am going to put her on doxycycline today we put gentamicin on the wound surface. Continuing with silver alginate 11/1; she has tolerated her antibiotics. I gave her doxycycline. Was still using gentamicin. The original wound looks about the same thick rolled edges. The cast injury just above it is superficial 11/8; even after an aggressive debridement last week the wound on her medial Charcot foot on the right looks about the same. The surface looks satisfactory however rolled edges of callus thick skin and subcutaneous tissue. She did not do well on 2 separate occasions in a  total contact cast. The cast abrasion she has on the medial foot is just above closed. We have been using polymen Ag. She is attempting to offload this is much as she can with her crutches 11/15; the wound on the Charcot right foot about the same in terms of surface area.. The area above this on the ankle is healed. 11/29; Charcot right foot wound. This is medially. Wound may be slightly smaller however still raised edges of callus and skin around the wound circumference. We  have been using polymen 12/6; Charcot right foot wound. This is medially on the plantar aspect. I did not debride this today wound looks about the same in a week she is reappeared with raised edges of callus and thick skin around the wound. We have been using polymen no real improvement 12/13; again an extensive debridement today. Thick callus skin and subcutaneous tissue around the wound margin with undermining. We have been using silver collagen since last week 12/20; in spite of the debridement last week the wound really looks no difference. Circumferential callus shallow wound granulation does not look too bad. We have been using silver collagen really no improvement 1/10 patient comes in with a wound looking about the same. Circumferential callus and subcutaneous tissue. I did not debride this today we have been using Sorbact and wrapping her leg. Even putting her in a total contact cast did not help here. She has an appointment with [name] on 2/9. Question of whether a surgical procedure to correct the deformity underneath this area might help. Electronic Signature(s) Signed: 01/30/2020 5:01:02 PM By: Baltazar Najjar MD Entered By: Baltazar Najjar on 01/27/2020 14:47:57 -------------------------------------------------------------------------------- Physical Exam Details Patient Name: Date of Service: [name], [name] 01/27/2020 1:30 PM Medical Record Number: [id number] Patient Account Number: [id number] Date of Birth/Sex: Treating RN: Jun 09, 1955 (65 y.o. [name] Primary Care Provider: MA Hedy Jacob MA LSA DA T Other Clinician: Referring Provider: Treating Provider/Extender: Baltazar Najjar MA SO UDI, ELHA MA LSA DA T Weeks in Treatment: 46 Constitutional Patient is hypertensive.. Pulse regular and within target range for patient.Marland Kitchen Respirations regular, non-labored and within target range.. Temperature is normal and within the target range for the patient.Marland Kitchen Appears in  no distress. Respiratory work of breathing is normal. Cardiovascular Wound exam;. Notes Wound exam; right medial plantar foot. Appearance of the wound is essentially unchanged thick callus skin and subcutaneous tissue around the deep punched- out hole. Even an extensive debridement here has not resulted in any improvement. There would appear to be a bony outgrowth underneath this. I wonder whether surgery in this area would help. Even offloading this in a total contact cast did not result in any improvement although we did manage to heal the area on the left foot Electronic Signature(s) Signed: 01/30/2020 5:01:02 PM By: Baltazar Najjar MD Entered By: Baltazar Najjar on 01/27/2020 14:52:31 -------------------------------------------------------------------------------- Physician Orders Details Patient Name: Date of Service: [name], [name] 01/27/2020 1:30 PM Medical Record Number: [id number] Patient Account Number: [id number] Date of Birth/Sex: Treating RN: [birth year] (65 y.o. [name] Primary Care Provider: MA SO UDI, [place] MA LSA DA T Other Clinician: Referring Provider: Treating Provider/Extender: Baltazar Najjar MA SO UDI, ELHA MA LSA DA T Weeks in Treatment: 81 Verbal / Phone Orders: No Diagnosis Coding ICD-10 Coding Code Description E11.621 Type 2 diabetes mellitus with foot ulcer L97.511 Non-pressure chronic ulcer of other part of right foot limited to breakdown of skin E11.42 Type 2 diabetes mellitus with diabetic  polyneuropathy M14.671 Charcot's joint, right ankle and foot Follow-up Appointments ppointment in 2 weeks. - MD visit Return A Nurse Visit: - 1 week Bathing/ Shower/ Hygiene May shower with protection but do not get wound dressing(s) wet. Edema Control - Lymphedema / SCD / Other Elevate legs to the level of the heart or above for 30 minutes daily and/or when sitting, a frequency of: - throughout the day Avoid standing for long periods of  time. Patient to wear own compression stockings every day. Wound Treatment Wound #9 - Foot Wound Laterality: Right, Medial Cleanser: Wound Cleanser 1 x Per Week/Other:wound [name] to change weekly Discharge Instructions: Cleanse the wound with wound cleanser prior to applying a clean dressing using gauze sponges, not tissue or cotton balls. Peri-Wound Care: Sween Lotion (Moisturizing lotion) 1 x Per Week/Other:wound [name] to change weekly Discharge Instructions: Apply moisturizing lotion as directed Prim Dressing: Cutimed Sorbact Swab 1 x Per Week/Other:wound [name] to change weekly ary Discharge Instructions: thin layer of hydrogel over Sorbact Secondary Dressing: ABD Pad, 5x9 1 x Per Week/Other:wound [name] to change weekly Discharge Instructions: Apply over primary dressing as directed. Secondary Dressing: Zetuvit Plus 4x8 in 1 x Per Week/Other:wound [name] to change weekly Discharge Instructions: Apply over primary dressing as directed. Secondary Dressing: Felt 2.5 yds x 5.5 in 1 x Per Week/Other:wound [name] to change weekly Discharge Instructions: Apply over primary dressing as directed. Compression Wrap: ThreePress (3 layer compression wrap) 1 x Per Week/Other:wound [name] to change weekly Discharge Instructions: Apply three layer compression as directed. Electronic Signature(s) Signed: 01/28/2020 6:04:09 PM By: Zenaida Deed RN, BSN Signed: 01/30/2020 5:01:02 PM By: Baltazar Najjar MD Entered By: Zenaida Deed on 01/27/2020 14:47:39 -------------------------------------------------------------------------------- Problem List Details Patient Name: Date of Service: [name], [name] 01/27/2020 1:30 PM Medical Record Number: [id number] Patient Account Number: [id number] Date of Birth/Sex: Treating RN: 1955/11/05 (65 y.o. [name] Primary Care Provider: MA SO UDI, Judene Companion MA LSA DA T Other Clinician: Referring Provider: Treating Provider/Extender: Abe People, ELHA MA LSA DA T Weeks in Treatment: 76 Active Problems ICD-10 Encounter Code Description Active Date MDM Diagnosis E11.621 Type 2 diabetes mellitus with foot ulcer 01/28/2019 No Yes L97.511 Non-pressure chronic ulcer of other part of right foot limited to breakdown of 01/28/2019 No Yes skin E11.42 Type 2 diabetes mellitus with diabetic polyneuropathy 01/28/2019 No Yes M14.671 Charcot's joint, right ankle and foot 01/28/2019 No Yes Inactive Problems ICD-10 Code Description Active Date Inactive Date L97.524 Non-pressure chronic ulcer of other part of left foot with necrosis of bone 01/28/2019 01/28/2019 L97.514 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 01/27/2020 1:30 PM Medical Record Number: [id number] Patient Account Number: [id number] Date of Birth/Sex: Treating RN: Jun 06, 1955 (65 y.o. [name] Primary Care Provider: MA SO UDI, [place] MA LSA DA T Other Clinician: Referring Provider: Treating Provider/Extender: Baltazar Najjar MA SO UDI, ELHA MA LSA DA T Weeks in Treatment: 20 Subjective History of Present Illness (HPI) 65 yrs old bf here for follow up recurrent left charcot foot ulcer. She has DM. She is not a smoker. She states a blister developed 3 months ago at site of previous breakdown  from 1 year ago. It was debrided at the podiatrist's office. She went to the ER and then sent here. She denies pain. Xray was negative for osteo. Culture from this clinic negative. 09/08/14  Referred by [name] to [name] for Charcot foot. Seen by him and MRI scheduled 09/19/14. Prior silver collagen but this was not ordered last visit, patient has been rinsing with saline alone. Re institute silver collagen every other day. F/u 2 weeks with [name] as Labor Day holiday. Supplies ordered. 09/25/14; this is a patient with a Charcot foot on the left. she has a wound over the plantar aspect of her left calcaneus. She has been seen by [name] of orthopedic surgery and has had an MRI. She is apparently going within the next week or 2 for corrective surgery which will involve an external fixator. I don't have any of the details of this. 10/06/14; The patient is a 65 yrs old bf with a left Charcot foot and ulcer on the plantar. 12/01/14 Returns post surgery with [name]. Has follow up visit later this week with him, currently in facility and NWB over this extremity. States no drainage from foot and no current wound care. On exam callus and scab in place, suspect healed. Has external fixator in place. Will defer to [name] for management, ok to place moisturizing cream over scabs and callus and she will call if she requires follow up here. READMISSION 01/28/2019 Patient was in the clinic in 2016 for a wound on the plantar aspect of her left calcaneus. Predominantly cared for by [name] she was referred to [name] and had corrective surgery for the position of her left ankle I believe. She had previously been here in 2015 and then prior to that in 2011 2012 that I do not have these records. More recently she has developed fairly extensive wounds on the right medial foot, left lateral foot and a small area on the right medial great toe. Right medial foot wound has been there for 6 to 7 months, left foot wound for 2 to 3 months and the right great toe only over the last few weeks. She has been followed by [name] at Dr. Laverta [place] office it sounds as though she has been  undergoing callus paring and silver nitrate. The patient has been washing these off with soap and water and plan applying dry dressing Past medical history, type 2 diabetes well controlled with a recent hemoglobin A1c of 7 on 11/16, type 2 diabetes with peripheral neuropathy, previous left Charcot joint surgery bilateral Charcot joints currently, stage III chronic renal failure, hypertension, obstructive sleep apnea, history of MRSA and gastroesophageal reflux. 1/18; this is a patient with bilateral Charcot feet. Plain x-rays that I did of both of these wounds that are either at bone or precariously close to bone were done. On the right foot this showed possible lytic destruction involving the anterior aspect of the talus which may represent a wound osteomyelitis. An MRI was recommended. On the left there was no definite lytic destruction although the wound is deep undermining's and I think probably requires an MRI on this basis in any case 1/25; patient was supposed to have her MRI last Friday of her bilateral feet however they would not do it because there was silver alginate in her dressings, will replace with calcium alginate today and will see if we can get her done today 2/1; patient did not get her MRIs done last week because  of issues with the MRI department receiving orders to do bilateral feet. She has 2 deep wounds in the setting of Charcot feet deformities bilaterally. Both of them at one point had exposed bone although I had trouble demonstrating that today. I am not sure that we can offload these very aggressively as I watched her leave the clinic last week her gait is very narrow and unsteady. 2/15; the MRI of her right foot did not show osteomyelitis potentially osteonecrosis of the talus. 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 The wounds are a large wound on the right medial foot and on the left a substantial wound on the left  lateral plantar calcaneus. We have been using silver alginate. Ultimately I think we need to consider a total contact cast on the right while we work through the possibility of osteomyelitis in the left. I watched her walk out with her crutches I have some trepidation about the ability to walk with the cast on her right foot. Nevertheless with her Charcot deformities I do not think there is a way to heal these short of offloading them aggressively. 2/22; the culture of the bone in the left foot that I did last week showed a few Citrobacter Koseri as well as some streptococcal species. In my attempted bone for pathology did not actually yield a lot of bone the pathologist did not identify any osteomyelitis. 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 Is promised today and aggressive debridement of the right foot and a total contact cast which surprisingly she seemed to tolerate quite well 03/13/2019 upon evaluation today patient appears to be doing well with her total contact cast she is here for the first cast change. She had no areas of rubbing or any other complications at this point. Overall things are doing quite well. 2/26; the patient was kindly seen by Dr. Orvan Falconer of infectious disease on 2/25. She is now on IV vancomycin PICC line placement in 2 days and oral Flagyl. This was in response to her MRI showing osteomyelitis of the left calcaneus concerning for osteomyelitis. We have been putting her right leg in a total contact cast. Our nurses report drainage. She is tolerating the total contact cast well. 3/4; the patient is started her IV antibiotics and is taking IV vancomycin and oral Flagyl. She appears to be tolerating these both well. This is for osteomyelitis involving the left calcaneus. The area on the right in the setting of bilateral  Charcot deformities did not have any bone infection on MRI. We put a total contact cast on her with silver alginate as the primary dressing and she returns today in follow-up. Per our intake nurse there was too much drainage to leave this on all week therefore we will bring her back for an early change 3/8; follow-up total contact cast she is still having a lot of drainage probably too much drainage from the right foot wound will week with the same cast. She will be back on Thursday. The area on the left looks somewhat better 3/11; patient here for a total contact cast change. 3/15; patient came in for wound care evaluation. She is still on IV vancomycin and oral Flagyl for osteomyelitis in the left foot. The area on the right foot we are putting in a total contact  cast. 3/18 for total contact cast change on the right foot 3/22; the patient was here for wound review today. The area on the right foot is slightly smaller in terms of surface area. However the surface of the wound is very gritty and hyper granulated. More problematically the area on the plantar left foot has increased depth indeed in the [name] of this it probes to bone. We have been using Hydrofera Blue in both areas. She is on antibiotics as prescribed by infectious disease IV vancomycin and oral Flagyl 3/25; the patient is in for total contact cast change. Our intake nurse was concerned about the amount of drainage which soaked right to the multiple layers we are putting on this. Wondered about options. The wound bed actually looks as healthy as this is looked although there may not be a lot of change in dimensions things are looking a lot better. If we are going to heal this woman's wound which is in the middle of her right Charcot foot this is going to need to be offloaded. There are not many alternatives 3/29; still not a lot of improvement although the surface of the right wound looks better. We have been using Hydrofera  Blue 04/17/2019 upon evaluation today patient appears to be doing well with a total contact cast. With actually having to change this twice a week. She saw [name] for wound care earlier in the week this is for the cast change today. That is due to the fact that she has significant drainage. Overall the wound is been looking excellent however. 4/5; we continue to put a total contact cast on this patient with Hydrofera Blue. Still a lot of drainage which precludes a simple weekly change or changing this twice a week. 4/8; total contact cast changed today. Apparently the wound looks better per our intake nurse 4/12; patient here for wound care evaluation. Wounds on the right foot have not changed in dimension none about a month left foot looks similar. Apparently her antibiotics that we are giving her for osteomyelitis in the left foot complete on Wednesday. We have been using Hydrofera Blue on the right foot under a total contact cast. Nurses report greenish drainage although I think this may be discoloration from the [facility] [name] 4/15; back for cast change wounds look quite good although still moderate amount of drainage. T contact cast reapplied otal 4/19;. Wound evaluation. Wound on the right foot is smaller surface looks healthy. There is still the divot in the middle part of this wound but I could not feel any bone. Area on the left foot also looks better She has completed her antibiotics but still has the PICC line in. 4/23; patient comes back for a total contact cast change this was done in the standard fashion no issues 4/26; wound measures slightly larger on the right foot which is disappointing. She sees Dr. Orvan Falconer with regards to her antibiotics on Wednesday. I believe she is on vancomycin IV and oral Flagyl. 4/29; in for her routine total contact cast change. She saw Dr. Orvan Falconer this morning. He is asking about antibiotic continuation I will be in touch with him. I did not look  at her wound today 5/3; patient saw Dr. Orvan Falconer on 4/28. I did send him a note asking about the continuation of perhaps an oral regimen. I have not heard back. She still has an elevated sedimentation rate at 81. Her wounds are smaller. 5/6; in for her total contact cast change. We did  not look at the wound today. She has seen Dr. Orvan Falconer and is now on oral Keflex and Flagyl. She seems to be tolerating these well 5/10; she had her PICC line removed. The wound on the right foot after some initial improvement has not made any improvement even with the total contact cast. Still having a lot of drainage. Likewise the area on the left foot really is not any better either 5/14; in for a cast change today. We will look at the right foot on Monday. If this is not making any progress I am going to try to cast the left foot. 5/17; the wound on the right foot is absolutely no different. I am going to put ointment on this area and change the cast of the left foot. We have been using silver alginate there 5/21; apparently the area on the right foot was better per our intake nurse although I did not see it. We did a standard total contact cast change on the left foot we have been applying silver alginate to the wound here. 06/10/19-Area on the right foot per dimensions slightly better, the left foot has still some depth applying silver alginate to the wound with TCC 06/13/19-Patient here for Casting to Left foot 6/1; patient had the total contact cast were placed on the left foot. Noted some swelling in her right leg although the dimensions of the wound on the right foot are better. We have been using PolyMem on the right and silver alginate on the left under the cast 6/7; we continue to make nice progress in both her plantar feet wound. We are putting the total contact cast on the left. Surprisingly in spite of this the area on the right medial foot in the middle of her Charcot deformities actually is making  progress as well 07/01/2019 upon evaluation today patient actually appears to be doing quite well with regard to her wounds on the feet compared to when I saw her last that has been quite some time. With that being said I do believe the total contact cast has been beneficial in this left foot and she has a lot of signs of improvement she does have some callus overhanging which is caused a little bit of a ledge and an issue here. I do believe that we may be able to do something to help in that regard. Fortunately otherwise the wound seems to be progressing quite nicely. 6/21; the patient's area on the left foot that we are currently casting is just about closed. The area on the right medial foot has a healthy looking surface but may be not changed as much from last week in terms of surface area. We are using silver alginate on the right and polymen Ag under the cast 6/28; only a small open area on the left foot remains. Using silver alginate under a total contact cast. She has a diabetic shoe I have asked her to bring that for next week where this may be closed. The area on the right foot with a Charcot deformity is also measuring slightly smaller. We are using PolyMem Ag here 7/6; left foot is totally closed. She does not require a total contact cast. She will graduate to her own diabetic shoe. ooThe area on the right foot with a Charcot deformity measuring about the same. We have been using polymen under compression wraps. She did not do well in this area with a total contact cast 7/13; left foot remains intact. She is skillfully  covering this area with foam and using her diabetic shoe. Slight area improvement of the right foot wound. Still rolled senescent looking edges. We have been using polymen 7/19; left foot remains intact. Wound measuring slightly smaller on the right foot in the middle of the Charcot deformity. We have been using PolyMem Ag 7/26; per the patient left foot remains intact. The  wound on the right foot looks slightly smaller but dimensions are measuring the same. We have been using polymen Ag 8/9-Patient back after 2 weeks, wound of the right foot again looking slightly smaller, continuing to use PolyMem silver 8/16; patient has made some minor improvements in wound area on the right foot. Using polymen Ag. The left foot remains healed 8/23; continued slight improvement in dimensions. Rolled edges around the wound edge noted. We have been using polymen Ag. Patient offloading with a surgical shoe and crutches 8/30; no change in surface area. Thick rolled skin and subcutaneous tissue around the edges debris on the wound surface. We have been using polymen Ag initially with some improvement however stalled on the last 2 weeks in terms of surface area 9/13; surface area is smaller still using polymen Ag. Thick rolled skin and subcutaneous tissue around the edges requiring debridement. 9/20; again surface area is improved using polymen Ag however each week she develops thick rolled skin and subcutaneous tissue around the wound edges requiring debridement. She is offloading this using crutches 9/27; we've been using PolyMem AG. Wound does not look any different from last week. Raised edges of callus skin and subcutaneous tissue around the wound bed. 10/5; we been using polymen Ag. No ability to put a total contact cast on today in the clinic. Wound is about the same still rolled edges of thick callus around the margins 10/18; we put her in a total contact cast last week things did not go well. Not only does the wound looked worse macerated angry with thick callus and some warmth around it. She had another wound above this and one on the anterior right leg. I am not totally certain why this happened. She has not been systemically unwell 10/25; culture from last week grew Pseudomonas and MRSA. My experience with this MRSA is usually a true infection and Pseudomonas a colonizer I  am going to put her on doxycycline today we put gentamicin on the wound surface. Continuing with silver alginate 11/1; she has tolerated her antibiotics. I gave her doxycycline. Was still using gentamicin. The original wound looks about the same thick rolled edges. The cast injury just above it is superficial 11/8; even after an aggressive debridement last week the wound on her medial Charcot foot on the right looks about the same. The surface looks satisfactory however rolled edges of callus thick skin and subcutaneous tissue. She did not do well on 2 separate occasions in a total contact cast. The cast abrasion she has on the medial foot is just above closed. We have been using polymen Ag. She is attempting to offload this is much as she can with her crutches 11/15; the wound on the Charcot right foot about the same in terms of surface area.. The area above this on the ankle is healed. 11/29; Charcot right foot wound. This is medially. Wound may be slightly smaller however still raised edges of callus and skin around the wound circumference. We have been using polymen 12/6; Charcot right foot wound. This is medially on the plantar aspect. I did not debride this today wound looks  about the same in a week she is reappeared with raised edges of callus and thick skin around the wound. We have been using polymen no real improvement 12/13; again an extensive debridement today. Thick callus skin and subcutaneous tissue around the wound margin with undermining. We have been using silver collagen since last week 12/20; in spite of the debridement last week the wound really looks no difference. Circumferential callus shallow wound granulation does not look too bad. We have been using silver collagen really no improvement 1/10 patient comes in with a wound looking about the same. Circumferential callus and subcutaneous tissue. I did not debride this today we have been using Sorbact and wrapping her leg. Even  putting her in a total contact cast did not help here. She has an appointment with [name] on 2/9. Question of whether a surgical procedure to correct the deformity underneath this area might help. Objective Constitutional Patient is hypertensive.. Pulse regular and within target range for patient.Marland Kitchen Respirations regular, non-labored and within target range.. Temperature is normal and within the target range for the patient.Marland Kitchen Appears in no distress. Vitals Time Taken: 2:26 PM, Height: 68 in, Source: Stated, Weight: 265 lbs, Source: Stated, BMI: 40.3, Temperature: 97.5 F, Pulse: 73 bpm, Respiratory Rate: 18 breaths/min, Blood Pressure: 148/83 mmHg, Capillary Blood Glucose: 130 mg/dl. General Notes: glucose per pt report this am Respiratory work of breathing is normal. Cardiovascular Wound exam;. General Notes: Wound exam; right medial plantar foot. Appearance of the wound is essentially unchanged thick callus skin and subcutaneous tissue around the deep punched-out hole. Even an extensive debridement here has not resulted in any improvement. There would appear to be a bony outgrowth underneath this. I wonder whether surgery in this area would help. Even offloading this in a total contact cast did not result in any improvement although we did manage to heal the area on the left foot Integumentary (Hair, Skin) Wound #9 status is Open. Original cause of wound was Blister. The wound is located on the Right,Medial Foot. The wound measures 1.5cm length x 1.3cm width x 0.6cm depth; 1.532cm^2 area and 0.919cm^3 volume. There is Fat Layer (Subcutaneous Tissue) exposed. There is no tunneling noted, however, there is undermining starting at 12:00 and ending at 5:00 with a maximum distance of 0.5cm. There is a large amount of serosanguineous drainage noted. The wound margin is thickened. There is large (67-100%) red, pale granulation within the wound bed. There is a small (1-33%) amount of necrotic  tissue within the wound bed including Adherent Slough. 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 There was a Three Layer Compression Therapy Procedure by Zenaida Deed, RN. Post procedure Diagnosis Wound #9: Same as Pre-Procedure Plan Follow-up Appointments: Return Appointment in 2 weeks. - MD visit Nurse Visit: - 1 week Bathing/ Shower/ Hygiene: May shower with protection but do not get wound dressing(s) wet. Edema Control - Lymphedema / SCD / Other: Elevate legs to the level of the heart or above for 30 minutes daily and/or when sitting, a frequency of: - throughout the day Avoid standing for long periods of time. Patient to wear own compression stockings every day. WOUND #9: - Foot Wound Laterality: Right, Medial  Cleanser: Wound Cleanser 1 x Per Week/Other:wound [name] to change weekly Discharge Instructions: Cleanse the wound with wound cleanser prior to applying a clean dressing using gauze sponges, not tissue or cotton balls. Peri-Wound Care: Sween Lotion (Moisturizing lotion) 1 x Per Week/Other:wound [name] to change weekly Discharge Instructions: Apply moisturizing lotion as directed Prim Dressing: Cutimed Sorbact Swab 1 x Per Week/Other:wound [name] to change weekly ary Discharge Instructions: thin layer of hydrogel over Sorbact Secondary Dressing: ABD Pad, 5x9 1 x Per Week/Other:wound [name] to change weekly Discharge Instructions: Apply over primary dressing as directed. Secondary Dressing: Zetuvit Plus 4x8 in 1 x Per Week/Other:wound [name] to change weekly Discharge Instructions: Apply over primary dressing as  directed. Secondary Dressing: Felt 2.5 yds x 5.5 in 1 x Per Week/Other:wound [name] to change weekly Discharge Instructions: Apply over primary dressing as directed. Com pression Wrap: ThreePress (3 layer compression wrap) 1 x Per Week/Other:wound [name] to change weekly Discharge Instructions: Apply three layer compression as directed. 1. I did not change the primary dressing here which is Sorbact still wrapping the right leg 2. See if there is something that Dr. Wyn Quaker would can do from a surgical point of view to help correct this deformity and allow the wound to heal over. Appointment is on February 9 Electronic Signature(s) Signed: 01/30/2020 5:01:02 PM By: Baltazar Najjar MD Entered By: Baltazar Najjar on 01/27/2020 14:53:09 -------------------------------------------------------------------------------- SuperBill Details Patient Name: Date of Service: [name], [name] 01/27/2020 Medical Record Number: [id number] Patient Account Number: [id number] Date of Birth/Sex: Treating RN: [birth year] (65 y.o. [name] Primary Care Provider: MA SO UDI, [place] MA LSA DA T Other Clinician: Referring Provider: Treating Provider/Extender: Abe People, ELHA MA LSA DA T Weeks in Treatment: 60 Diagnosis Coding ICD-10 Codes Code Description E11.621 Type 2 diabetes mellitus with foot ulcer L97.511 Non-pressure chronic ulcer of other part of right foot limited to breakdown of skin E11.42 Type 2 diabetes mellitus with diabetic polyneuropathy M14.671 Charcot's joint, right ankle and foot Facility Procedures CPT4 Code: [id number] Description: (Facility Use Only) [phone] - APPLY MULTLAY COMPRS LWR RT LEG Modifier: Quantity: 1 Physician Procedures : CPT4 Code Description Modifier [id number] - WC PHYS LEVEL 3 - EST PT ICD-10 Diagnosis Description L97.511 Non-pressure chronic ulcer of other part of right foot limited to breakdown of skin E11.42 Type 2 diabetes mellitus with  diabetic  polyneuropathy M14.671 Charcot's joint, right ankle and foot Quantity: 1 Electronic Signature(s) Signed: 01/27/2020 5:04:19 PM By: Zandra Abts RN, BSN Signed: 01/30/2020 5:01:02 PM By: Baltazar Najjar MD Entered By: Zandra Abts on 01/27/2020 16:38:39

## 2020-01-30 NOTE — Progress Notes (Signed)
[name], [name] ([id number]) Visit Report for 01/27/2020 Arrival Information Details Patient Name: Date of Service: SECRET, CRADLE 01/27/2020 1:30 PM Medical Record Number: [id number] Patient Account Number: [id number] Date of Birth/Sex: Treating RN: 10-24-1955 (65 y.o. [name] Primary Care Teal Bontrager: MA SO UDI, [place] MA LSA DA T Other Clinician: Referring Nilsa Macht: Treating Shimon Trowbridge/Extender: Baltazar Najjar MA SO UDI, ELHA MA LSA DA T Weeks in Treatment: 27 Visit Information History Since Last Visit Added or deleted any medications: No Patient Arrived: Crutches Any new allergies or adverse reactions: No Arrival Time: 14:25 Had a fall or experienced change in No Accompanied By: self activities of daily living that may affect Transfer Assistance: None risk of falls: Patient Identification Verified: Yes Signs or symptoms of abuse/neglect since No Secondary Verification Process Completed: Yes last visito Patient Requires Transmission-Based Precautions: No Hospitalized since last visit: No Patient Has Alerts: No Implantable device outside of the clinic No excluding cellular tissue based products placed in the center since last visit: Has Dressing in Place as Prescribed: Yes Has Footwear/Offloading in Place as Yes Prescribed: Right: Surgical Shoe with Pressure Relief Insole Pain Present Now: No Electronic Signature(s) Signed: 01/28/2020 6:04:09 PM By: Zenaida Deed RN, BSN Entered By: Zenaida Deed on 01/27/2020 14:26:24 -------------------------------------------------------------------------------- Compression Therapy Details Patient Name: Date of Service: [name], Curley J. 01/27/2020 1:30 PM Medical Record Number: [id number] Patient Account Number: [id number] Date of Birth/Sex: Treating RN: 08-28-1955 (65 y.o. [name] Primary Care Kurstin Dimarzo: MA SO UDI, [place] MA LSA DA T Other Clinician: Referring Yoshimi Sarr: Treating Ayahna Solazzo/Extender: Baltazar Najjar MA SO UDI, ELHA MA LSA DA T Weeks in Treatment: 81 Compression Therapy Performed for Wound Assessment: Wound #9 Right,Medial Foot Performed By: Clinician Zenaida Deed, RN Compression Type: Three Layer Post Procedure Diagnosis Same as Pre-procedure Electronic Signature(s) Signed: 01/28/2020 6:04:09 PM By: Zenaida Deed RN, BSN Entered By: Zenaida Deed on 01/27/2020 14:46:56 -------------------------------------------------------------------------------- Encounter Discharge Information Details Patient Name: Date of Service: [facility], Rochanda J. 01/27/2020 1:30 PM Medical Record Number: [id number] Patient Account Number: [id number] Date of Birth/Sex: Treating RN: 1955/07/11 (65 y.o. [name] Primary Care Diamon Reddinger: MA SO UDI, Judene Companion MA LSA DA T Other Clinician: Referring Icelynn Onken: Treating Sheera Illingworth/Extender: Baltazar Najjar MA SO UDI, ELHA MA LSA DA T Weeks in Treatment: 60 Encounter Discharge Information Items Discharge Condition: Stable Ambulatory Status: Crutches Discharge Destination: Home Transportation: Private Auto Accompanied By: self Schedule Follow-up Appointment: Yes Clinical Summary of Care: Electronic Signature(s) Signed: 01/27/2020 5:17:21 PM By: Shawn Stall Entered By: Shawn Stall on 01/27/2020 16:55:10 -------------------------------------------------------------------------------- Lower Extremity Assessment Details Patient Name: Date of Service: MARYANNE, DIVITO. 01/27/2020 1:30 PM Medical Record Number: [id number] Patient Account Number: [id number] Date of Birth/Sex: Treating RN: 01-15-1956 (65 y.o. [name] Primary Care Gaspare Netzel: MA SO UDI, [place] MA LSA DA T Other Clinician: Referring Quanisha Drewry: Treating Deshayla Empson/Extender: Baltazar Najjar MA SO UDI, ELHA MA LSA DA T Weeks in Treatment: 52 Edema Assessment Assessed: [Left: No] [Right: No] Edema: [Left: N] [Right: o] Calf Left: Right: Point of Measurement: 43 cm From  Medial Instep 33.5 cm Ankle Left: Right: Point of Measurement: 12 cm From Medial Instep 21.6 cm Vascular Assessment Pulses: Dorsalis Pedis Palpable: [Right:Yes] Electronic Signature(s) Signed: 01/28/2020 6:04:09 PM By: Zenaida Deed RN, BSN Entered By: Zenaida Deed on 01/27/2020 14:30:11 -------------------------------------------------------------------------------- Multi Wound Chart Details Patient Name: Date of Service: [name], [name] 01/27/2020 1:30 PM Medical Record Number: [id number] Patient Account Number: [id number] Date of Birth/Sex: Treating RN: [birth year] (64  y.o. [name] Primary Care Anabella Capshaw: MA SO UDI, Judene Companion MA LSA DA T Other Clinician: Referring Lenin Kuhnle: Treating Ysela Hettinger/Extender: Baltazar Najjar MA SO UDI, ELHA MA LSA DA T Weeks in Treatment: 66 Vital Signs Height(in): 68 Capillary Blood Glucose(mg/dl): 397 Weight(lbs): 673 Pulse(bpm): 73 Body Mass Index(BMI): 40 Blood Pressure(mmHg): 148/83 Temperature(F): 97.5 Respiratory Rate(breaths/min): 18 Photos: [9:No Photos Right, Medial Foot] [N/A:N/A N/A] Wound Location: [9:Blister] [N/A:N/A] Wounding Event: [9:Diabetic Wound/Ulcer of the Lower] [N/A:N/A] Primary Etiology: [9:Extremity Glaucoma, Chronic sinus] [N/A:N/A] Comorbid History: [9:problems/congestion, Anemia, Sleep Apnea, Hypertension, Type II Diabetes, Rheumatoid Arthritis, Neuropathy 06/18/2018] [N/A:N/A] Date Acquired: [9:52] [N/A:N/A] Weeks of Treatment: [9:Open] [N/A:N/A] Wound Status: [9:1.5x1.3x0.6] [N/A:N/A] Measurements L x W x D (cm) [9:1.532] [N/A:N/A] A (cm) : rea [9:0.919] [N/A:N/A] Volume (cm) : [9:86.10%] [N/A:N/A] % Reduction in A rea: [9:90.70%] [N/A:N/A] % Reduction in Volume: [9:12] Starting Position 1 (o'clock): [9:5] Ending Position 1 (o'clock): [9:0.5] Maximum Distance 1 (cm): [9:Yes] [N/A:N/A] Undermining: [9:Grade 2] [N/A:N/A] Classification: [9:Large] [N/A:N/A] Exudate A mount: [9:Serosanguineous]  [N/A:N/A] Exudate Type: [9:red, brown] [N/A:N/A] Exudate Color: [9:Thickened] [N/A:N/A] Wound Margin: [9:Large (67-100%)] [N/A:N/A] Granulation A mount: [9:Red, Pale] [N/A:N/A] Granulation Quality: [9:Small (1-33%)] [N/A:N/A] Necrotic A mount: [9:Fat Layer (Subcutaneous Tissue): Yes N/A] Exposed Structures: [9:Fascia: No Tendon: No Muscle: No Joint: No Bone: No] Treatment Notes Electronic Signature(s) Signed: 01/27/2020 5:04:19 PM By: Zandra Abts RN, BSN Signed: 01/30/2020 5:01:02 PM By: Baltazar Najjar MD Entered By: Baltazar Najjar on 01/27/2020 14:46:38 -------------------------------------------------------------------------------- Multi-Disciplinary Care Plan Details Patient Name: Date of Service: [facility], Takeila J. 01/27/2020 1:30 PM Medical Record Number: [id number] Patient Account Number: [id number] Date of Birth/Sex: Treating RN: [birth year] (65 y.o. [name] Primary Care Averey Trompeter: MA SO UDI, Judene Companion MA LSA DA T Other Clinician: Referring Ardit Danh: Treating Kermitt Harjo/Extender: Baltazar Najjar MA SO UDI, ELHA MA LSA DA T Weeks in Treatment: 48 Active Inactive Wound/Skin Impairment Nursing Diagnoses: Impaired tissue integrity Knowledge deficit related to ulceration/compromised skin integrity Goals: Patient/caregiver will verbalize understanding of skin care regimen Date Initiated: 01/28/2019 Target Resolution Date: 02/28/2020 Goal Status: Active Interventions: Assess patient/caregiver ability to obtain necessary supplies Assess patient/caregiver ability to perform ulcer/skin care regimen upon admission and as needed Assess ulceration(s) every visit Provide education on ulcer and skin care Notes: Electronic Signature(s) Signed: 01/27/2020 5:04:19 PM By: Zandra Abts RN, BSN Entered By: Zandra Abts on 01/27/2020 16:38:04 -------------------------------------------------------------------------------- Pain Assessment Details Patient Name: Date of  Service: [name], Jennier J. 01/27/2020 1:30 PM Medical Record Number: [id number] Patient Account Number: [id number] Date of Birth/Sex: Treating RN: 1955-08-30 (65 y.o. [name] Primary Care Duvid Smalls: MA SO UDI, [place] MA LSA DA T Other Clinician: Referring Aseel Truxillo: Treating Sheronica Corey/Extender: Baltazar Najjar MA SO UDI, ELHA MA LSA DA T Weeks in Treatment: 101 Active Problems Location of Pain Severity and Description of Pain Patient Has Paino No Site Locations Rate the pain. Current Pain Level: 0 Pain Management and Medication Current Pain Management: Electronic Signature(s) Signed: 01/28/2020 6:04:09 PM By: Zenaida Deed RN, BSN Entered By: Zenaida Deed on 01/27/2020 14:28:35 -------------------------------------------------------------------------------- Patient/Caregiver Education Details Patient Name: Date of Service: Gypsy Decant 1/10/2022andnbsp1:30 PM Medical Record Number: [id number] Patient Account Number: [id number] Date of Birth/Gender: Treating RN: 04-01-1955 (65 y.o. [name] Primary Care Physician: MA SO UDI, Judene Companion MA LSA DA T Other Clinician: Referring Physician: Treating Physician/Extender: Baltazar Najjar MA SO UDI, ELHA MA LSA DA T Weeks in Treatment: 65 Education Assessment Education Provided To: Patient Education Topics Provided Wound/Skin Impairment: Methods: Explain/Verbal Responses: State content correctly Electronic  Signature(s) Signed: 01/27/2020 5:04:19 PM By: Zandra Abts RN, BSN Entered By: Zandra Abts on 01/27/2020 16:38:17 -------------------------------------------------------------------------------- Wound Assessment Details Patient Name: Date of Service: [name], Nazli J. 01/27/2020 1:30 PM Medical Record Number: [id number] Patient Account Number: [id number] Date of Birth/Sex: Treating RN: 30-Sep-1955 (65 y.o. [name] Primary Care Hae Ahlers: MA SO UDI, [place] MA LSA DA T Other  Clinician: Referring Yolandra Habig: Treating Dillin Lofgren/Extender: Baltazar Najjar MA SO UDI, ELHA MA LSA DA T Weeks in Treatment: 90 Wound Status Wound Number: 9 Primary Diabetic Wound/Ulcer of the Lower Extremity Etiology: Wound Location: Right, Medial Foot Wound Open Wounding Event: Blister Status: Date Acquired: 06/18/2018 Comorbid Glaucoma, Chronic sinus problems/congestion, Anemia, Sleep Weeks Of Treatment: 52 History: Apnea, Hypertension, Type II Diabetes, Rheumatoid Arthritis, Clustered Wound: No Neuropathy Photos Photo Uploaded By: Benjaman Kindler on 01/29/2020 11:07:53 Wound Measurements Length: (cm) 1.5 Width: (cm) 1.3 Depth: (cm) 0.6 Area: (cm) 1.532 Volume: (cm) 0.919 % Reduction in Area: 86.1% % Reduction in Volume: 90.7% Tunneling: No Undermining: Yes Starting Position (o'clock): 12 Ending Position (o'clock): 5 Maximum Distance: (cm) 0.5 Wound Description Classification: Grade 2 Wound Margin: Thickened Exudate Amount: Large Exudate Type: Serosanguineous Exudate Color: red, brown Foul Odor After Cleansing: No Slough/Fibrino Yes Wound Bed Granulation Amount: Large (67-100%) Exposed Structure Granulation Quality: Red, Pale Fascia Exposed: No Necrotic Amount: Small (1-33%) Fat Layer (Subcutaneous Tissue) Exposed: Yes Necrotic Quality: Adherent Slough Tendon Exposed: No Muscle Exposed: No Joint Exposed: No Bone Exposed: No Treatment Notes Wound #9 (Foot) Wound Laterality: Right, Medial Cleanser Wound Cleanser Discharge Instruction: Cleanse the wound with wound cleanser prior to applying a clean dressing using gauze sponges, not tissue or cotton balls. Peri-Wound Care Sween Lotion (Moisturizing lotion) Discharge Instruction: Apply moisturizing lotion as directed Topical Primary Dressing Cutimed Sorbact Swab Discharge Instruction: thin layer of hydrogel over Sorbact Secondary Dressing ABD Pad, 5x9 Discharge Instruction: Apply over primary dressing as  directed. Zetuvit Plus 4x8 in Discharge Instruction: Apply over primary dressing as directed. Felt 2.5 yds x 5.5 in Discharge Instruction: Apply over primary dressing as directed. Secured With Compression Wrap ThreePress (3 layer compression wrap) Discharge Instruction: Apply three layer compression as directed. Compression Stockings Add-Ons Electronic Signature(s) Signed: 01/28/2020 6:04:09 PM By: Zenaida Deed RN, BSN Entered By: Zenaida Deed on 01/27/2020 14:33:57 -------------------------------------------------------------------------------- Vitals Details Patient Name: Date of Service: [name], Mylani J. 01/27/2020 1:30 PM Medical Record Number: [id number] Patient Account Number: [id number] Date of Birth/Sex: Treating RN: [birth year] (65 y.o. [name] Primary Care Jeronica Stlouis: MA SO UDI, [place] MA LSA DA T Other Clinician: Referring Jostin Rue: Treating Can Lucci/Extender: Baltazar Najjar MA SO UDI, ELHA MA LSA DA T Weeks in Treatment: 79 Vital Signs Time Taken: 14:26 Temperature (F): 97.5 Height (in): 68 Pulse (bpm): 73 Source: Stated Respiratory Rate (breaths/min): 18 Weight (lbs): 265 Blood Pressure (mmHg): 148/83 Source: Stated Capillary Blood Glucose (mg/dl): 800 Body Mass Index (BMI): 40.3 Reference Range: 80 - 120 mg / dl Notes glucose per pt report this am Electronic Signature(s) Signed: 01/28/2020 6:04:09 PM By: Zenaida Deed RN, BSN Entered By: Zenaida Deed on 01/27/2020 14:27:47

## 2020-02-03 ENCOUNTER — Encounter (HOSPITAL_BASED_OUTPATIENT_CLINIC_OR_DEPARTMENT_OTHER): Payer: Medicare Other | Admitting: Internal Medicine

## 2020-02-05 ENCOUNTER — Other Ambulatory Visit: Payer: Self-pay

## 2020-02-05 ENCOUNTER — Encounter (HOSPITAL_BASED_OUTPATIENT_CLINIC_OR_DEPARTMENT_OTHER): Payer: Medicare Other | Admitting: Physician Assistant

## 2020-02-05 DIAGNOSIS — E11621 Type 2 diabetes mellitus with foot ulcer: Secondary | ICD-10-CM | POA: Diagnosis not present

## 2020-02-05 NOTE — Progress Notes (Addendum)
Sarah, [name] ([id number]) Visit Report for 02/05/2020 Chief Complaint Document Details Patient Name: Date of Service: [name], [name] 02/05/2020 1:45 PM Medical Record Number: [id number] Patient Account Number: [id number] Date of Birth/Sex: Treating RN: Oct 17, 1955 (65 y.o. [name] Primary Care Provider: MA SO UDI, [place] MA LSA DA T Other Clinician: Referring Provider: Treating Provider/Extender: Lenda Kelp MA SO UDI, [place] MA LSA DA T Weeks in Treatment: 12 Information Obtained from: Patient Chief Complaint Patients presents for follow up of a diabetic ulcer 01/28/2019; patient is here for review of diabetic foot ulcers bilaterally Electronic Signature(s) Signed: 02/05/2020 2:39:09 PM By: Lenda Kelp PA-C Entered By: Lenda Kelp on 02/05/2020 14:39:09 -------------------------------------------------------------------------------- Debridement Details Patient Name: Date of Service: [name], [name] 02/05/2020 1:45 PM Medical Record Number: [id number] Patient Account Number: [id number] Date of Birth/Sex: Treating RN: Jun 08, 1955 (65 y.o. F) [name] Primary Care Provider: MA SO UDI, [place] MA LSA DA T Other Clinician: Referring Provider: Treating Provider/Extender: Lenda Kelp MA SO UDI, [place] MA LSA DA T Weeks in Treatment: 11 Debridement Performed for Assessment: Wound #9 Right,Medial Foot Performed By: Physician Lenda Kelp, PA Debridement Type: Debridement Severity of Tissue Pre Debridement: Fat layer exposed Level of Consciousness (Pre-procedure): Awake and Alert Pre-procedure Verification/Time Out Yes - 14:40 Taken: Start Time: 14:40 Pain Control: Other : Benzocaine 20% T Area Debrided (L x W): otal 3.5 (cm) x 3.5 (cm) = 12.25 (cm) Tissue and other material debrided: Viable, Non-Viable, Callus, Slough, Subcutaneous, Skin: Epidermis, Slough Level: Skin/Subcutaneous Tissue Debridement Description: Excisional Instrument:  Curette Bleeding: Minimum Hemostasis Achieved: Pressure End Time: 14:50 Procedural Pain: 0 Post Procedural Pain: 0 Response to Treatment: Procedure was tolerated well Level of Consciousness (Post- Awake and Alert procedure): Post Debridement Measurements of Total Wound Length: (cm) 2 Width: (cm) 2 Depth: (cm) 1 Volume: (cm) 3.142 Character of Wound/Ulcer Post Debridement: Improved Severity of Tissue Post Debridement: Fat layer exposed Post Procedure Diagnosis Same as Pre-procedure Electronic Signature(s) Signed: 02/05/2020 5:51:30 PM By: Sarah Deed RN, BSN Signed: 02/05/2020 6:05:28 PM By: Lenda Kelp PA-C Entered By: [name] on 02/05/2020 14:45:42 -------------------------------------------------------------------------------- HPI Details Patient Name: Date of Service: [name], [name] 02/05/2020 1:45 PM Medical Record Number: [id number] Patient Account Number: [id number] Date of Birth/Sex: Treating RN: 08-Jul-1955 (65 y.o. [name] Primary Care Provider: MA SO UDI, [place] MA LSA DA T Other Clinician: Referring Provider: Treating Provider/Extender: Lenda Kelp MA SO UDI, [place] MA LSA DA T Weeks in Treatment: 89 History of Present Illness HPI Description: 65 yrs old bf here for follow up recurrent left charcot foot ulcer. She has DM. She is not a smoker. She states a blister developed 3 months ago at site of previous breakdown from 1 year ago. It was debrided at the podiatrist's office. She went to the ER and then sent here. She denies pain. Xray was negative for osteo. Culture from this clinic negative. 09/08/14 Referred by [name] to [name] for Charcot foot. Seen by him and MRI scheduled 09/19/14. Prior silver collagen but this was not ordered last visit, patient has been rinsing with saline alone. Re institute silver collagen every other day. F/u 2 weeks with [name] as Labor Day holiday. Supplies ordered. 09/25/14; this is a patient with a  Charcot foot on the left. she has a wound over the plantar aspect of her left calcaneus. She has been seen by [name] of orthopedic surgery and has had an MRI.  She is apparently going within the next week or 2 for corrective surgery which will involve an external fixator. I don't have any of the details of this. 10/06/14; The patient is a 65 yrs old bf with a left Charcot foot and ulcer on the plantar. 12/01/14 Returns post surgery with [name]. Has follow up visit later this week with him, currently in facility and NWB over this extremity. States no drainage from foot and no current wound care. On exam callus and scab in place, suspect healed. Has external fixator in place. Will defer to [name] for management, ok to place moisturizing cream over scabs and callus and she will call if she requires follow up here. READMISSION 01/28/2019 Patient was in the clinic in 2016 for a wound on the plantar aspect of her left calcaneus. Predominantly cared for by [name] she was referred to [name] and had corrective surgery for the position of her left ankle I believe. She had previously been here in 2015 and then prior to that in 2011 2012 that I do not have these records. More recently she has developed fairly extensive wounds on the right medial foot, left lateral foot and a small area on the right medial great toe. Right medial foot wound has been there for 6 to 7 months, left foot wound for 2 to 3 months and the right great toe only over the last few weeks. She has been followed by [name] at Dr. Laverta [place] office it sounds as though she has been undergoing callus paring and silver nitrate. The patient has been washing these off with soap and water and plan applying dry dressing Past medical history, type 2 diabetes well controlled with a recent hemoglobin A1c of 7 on 11/16, type 2 diabetes with peripheral neuropathy, previous left Charcot joint surgery bilateral Charcot joints  currently, stage III chronic renal failure, hypertension, obstructive sleep apnea, history of MRSA and gastroesophageal reflux. 1/18; this is a patient with bilateral Charcot feet. Plain x-rays that I did of both of these wounds that are either at bone or precariously close to bone were done. On the right foot this showed possible lytic destruction involving the anterior aspect of the talus which may represent a wound osteomyelitis. An MRI was recommended. On the left there was no definite lytic destruction although the wound is deep undermining's and I think probably requires an MRI on this basis in any case 1/25; patient was supposed to have her MRI last Friday of her bilateral feet however they would not do it because there was silver alginate in her dressings, will replace with calcium alginate today and will see if we can get her done today 2/1; patient did not get her MRIs done last week because of issues with the MRI department receiving orders to do bilateral feet. She has 2 deep wounds in the setting of Charcot feet deformities bilaterally. Both of them at one point had exposed bone although I had trouble demonstrating that today. I am not sure that we can offload these very aggressively as I watched her leave the clinic last week her gait is very narrow and unsteady. 2/15; the MRI of her right foot did not show osteomyelitis potentially osteonecrosis of the talus. 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 The wounds are a large wound on the right medial foot and on the left a substantial wound on the left lateral plantar calcaneus. We have been using silver  alginate. Ultimately I think we need to consider a total contact cast on the right while we work through the possibility of osteomyelitis in the left. I watched her walk out with her crutches I have some trepidation about the ability to walk with the cast on her right foot. Nevertheless  with her Charcot deformities I do not think there is a way to heal these short of offloading them aggressively. 2/22; the culture of the bone in the left foot that I did last week showed a few Citrobacter Koseri as well as some streptococcal species. In my attempted bone for pathology did not actually yield a lot of bone the pathologist did not identify any osteomyelitis. 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 Is promised today and aggressive debridement of the right foot and a total contact cast which surprisingly she seemed to tolerate quite well 03/13/2019 upon evaluation today patient appears to be doing well with her total contact cast she is here for the first cast change. She had no areas of rubbing or any other complications at this point. Overall things are doing quite well. 2/26; the patient was kindly seen by Dr. Orvan Falconer of infectious disease on 2/25. She is now on IV vancomycin PICC line placement in 2 days and oral Flagyl. This was in response to her MRI showing osteomyelitis of the left calcaneus concerning for osteomyelitis. We have been putting her right leg in a total contact cast. Our nurses report drainage. She is tolerating the total contact cast well. 3/4; the patient is started her IV antibiotics and is taking IV vancomycin and oral Flagyl. She appears to be tolerating these both well. This is for osteomyelitis involving the left calcaneus. The area on the right in the setting of bilateral Charcot deformities did not have any bone infection on MRI. We put a total contact cast on her with silver alginate as the primary dressing and she returns today in follow-up. Per our intake nurse there was too much drainage to leave this on all week therefore we will bring her back for an early change 3/8; follow-up total contact cast she is  still having a lot of drainage probably too much drainage from the right foot wound will week with the same cast. She will be back on Thursday. The area on the left looks somewhat better 3/11; patient here for a total contact cast change. 3/15; patient came in for wound care evaluation. She is still on IV vancomycin and oral Flagyl for osteomyelitis in the left foot. The area on the right foot we are putting in a total contact cast. 3/18 for total contact cast change on the right foot 3/22; the patient was here for wound review today. The area on the right foot is slightly smaller in terms of surface area. However the surface of the wound is very gritty and hyper granulated. More problematically the area on the plantar left foot has increased depth indeed in the center of this it probes to bone. We have been using Hydrofera Blue in both areas. She is on antibiotics as prescribed by infectious disease IV vancomycin and oral Flagyl 3/25; the patient is in for total contact cast change. Our intake nurse was concerned about the amount of drainage which soaked right to the multiple layers we are putting on this. Wondered about options. The  wound bed actually looks as healthy as this is looked although there may not be a lot of change in dimensions things are looking a lot better. If we are going to heal this woman's wound which is in the middle of her right Charcot foot this is going to need to be offloaded. There are not many alternatives 3/29; still not a lot of improvement although the surface of the right wound looks better. We have been using Hydrofera Blue 04/17/2019 upon evaluation today patient appears to be doing well with a total contact cast. With actually having to change this twice a week. She saw [name] for wound care earlier in the week this is for the cast change today. That is due to the fact that she has significant drainage. Overall the wound is been looking excellent however. 4/5;  we continue to put a total contact cast on this patient with Hydrofera Blue. Still a lot of drainage which precludes a simple weekly change or changing this twice a week. 4/8; total contact cast changed today. Apparently the wound looks better per our intake nurse 4/12; patient here for wound care evaluation. Wounds on the right foot have not changed in dimension none about a month left foot looks similar. Apparently her antibiotics that we are giving her for osteomyelitis in the left foot complete on Wednesday. We have been using Hydrofera Blue on the right foot under a total contact cast. Nurses report greenish drainage although I think this may be discoloration from the [facility] 4/15; back for cast change wounds look quite good although still moderate amount of drainage. T contact cast reapplied otal 4/19;. Wound evaluation. Wound on the right foot is smaller surface looks healthy. There is still the divot in the middle part of this wound but I could not feel any bone. Area on the left foot also looks better She has completed her antibiotics but still has the PICC line in. 4/23; patient comes back for a total contact cast change this was done in the [name] fashion no issues 4/26; wound measures slightly larger on the right foot which is disappointing. She sees Dr. Orvan Falconer with regards to her antibiotics on Wednesday. I believe she is on vancomycin IV and oral Flagyl. 4/29; in for her routine total contact cast change. She saw Dr. Orvan Falconer this morning. He is asking about antibiotic continuation I will be in touch with him. I did not look at her wound today 5/3; patient saw Dr. Orvan Falconer on 4/28. I did send him a note asking about the continuation of perhaps an oral regimen. I have not heard back. She still has an elevated sedimentation rate at 81. Her wounds are smaller. 5/6; in for her total contact cast change. We did not look at the wound today. She has seen Dr. Orvan Falconer and is now on  oral Keflex and Flagyl. She seems to be tolerating these well 5/10; she had her PICC line removed. The wound on the right foot after some initial improvement has not made any improvement even with the total contact cast. Still having a lot of drainage. Likewise the area on the left foot really is not any better either 5/14; in for a cast change today. We will look at the right foot on Monday. If this is not making any progress I am going to try to cast the left foot. 5/17; the wound on the right foot is absolutely no different. I am going to put ointment on this area and  change the cast of the left foot. We have been using silver alginate there 5/21; apparently the area on the right foot was better per our intake nurse although I did not see it. We did a [name] total contact cast change on the left foot we have been applying silver alginate to the wound here. 06/10/19-Area on the right foot per dimensions slightly better, the left foot has still some depth applying silver alginate to the wound with TCC 06/13/19-Patient here for Casting to Left foot 6/1; patient had the total contact cast were placed on the left foot. Noted some swelling in her right leg although the dimensions of the wound on the right foot are better. We have been using PolyMem on the right and silver alginate on the left under the cast 6/7; we continue to make nice progress in both her plantar feet wound. We are putting the total contact cast on the left. Surprisingly in spite of this the area on the right medial foot in the middle of her Charcot deformities actually is making progress as well 07/01/2019 upon evaluation today patient actually appears to be doing quite well with regard to her wounds on the feet compared to when I saw her last that has been quite some time. With that being said I do believe the total contact cast has been beneficial in this left foot and she has a lot of signs of improvement she does have some callus  overhanging which is caused a little bit of a ledge and an issue here. I do believe that we may be able to do something to help in that regard. Fortunately otherwise the wound seems to be progressing quite nicely. 6/21; the patient's area on the left foot that we are currently casting is just about closed. The area on the right medial foot has a healthy looking surface but may be not changed as much from last week in terms of surface area. We are using silver alginate on the right and polymen Ag under the cast 6/28; only a small open area on the left foot remains. Using silver alginate under a total contact cast. She has a diabetic shoe I have asked her to bring that for next week where this may be closed. The area on the right foot with a Charcot deformity is also measuring slightly smaller. We are using PolyMem Ag here 7/6; left foot is totally closed. She does not require a total contact cast. She will graduate to her own diabetic shoe. The area on the right foot with a Charcot deformity measuring about the same. We have been using polymen under compression wraps. She did not do well in this area with a total contact cast 7/13; left foot remains intact. She is skillfully covering this area with foam and using her diabetic shoe. Slight area improvement of the right foot wound. Still rolled senescent looking edges. We have been using polymen 7/19; left foot remains intact. Wound measuring slightly smaller on the right foot in the middle of the Charcot deformity. We have been using PolyMem Ag 7/26; per the patient left foot remains intact. The wound on the right foot looks slightly smaller but dimensions are measuring the same. We have been using polymen Ag 8/9-Patient back after 2 weeks, wound of the right foot again looking slightly smaller, continuing to use PolyMem silver 8/16; patient has made some minor improvements in wound area on the right foot. Using polymen Ag. The left foot remains  healed 8/23; continued  slight improvement in dimensions. Rolled edges around the wound edge noted. We have been using polymen Ag. Patient offloading with a surgical shoe and crutches 8/30; no change in surface area. Thick rolled skin and subcutaneous tissue around the edges debris on the wound surface. We have been using polymen Ag initially with some improvement however stalled on the last 2 weeks in terms of surface area 9/13; surface area is smaller still using polymen Ag. Thick rolled skin and subcutaneous tissue around the edges requiring debridement. 9/20; again surface area is improved using polymen Ag however each week she develops thick rolled skin and subcutaneous tissue around the wound edges requiring debridement. She is offloading this using crutches 9/27; we've been using PolyMem AG. Wound does not look any different from last week. Raised edges of callus skin and subcutaneous tissue around the wound bed. 10/5; we been using polymen Ag. No ability to put a total contact cast on today in the clinic. Wound is about the same still rolled edges of thick callus around the margins 10/18; we put her in a total contact cast last week things did not go well. Not only does the wound looked worse macerated angry with thick callus and some warmth around it. She had another wound above this and one on the anterior right leg. I am not totally certain why this happened. She has not been systemically unwell 10/25; culture from last week grew Pseudomonas and MRSA. My experience with this MRSA is usually a true infection and Pseudomonas a colonizer I am going to put her on doxycycline today we put gentamicin on the wound surface. Continuing with silver alginate 11/1; she has tolerated her antibiotics. I gave her doxycycline. Was still using gentamicin. The original wound looks about the same thick rolled edges. The cast injury just above it is superficial 11/8; even after an aggressive debridement  last week the wound on her medial Charcot foot on the right looks about the same. The surface looks satisfactory however rolled edges of callus thick skin and subcutaneous tissue. She did not do well on 2 separate occasions in a total contact cast. The cast abrasion she has on the medial foot is just above closed. We have been using polymen Ag. She is attempting to offload this is much as she can with her crutches 11/15; the wound on the Charcot right foot about the same in terms of surface area.. The area above this on the ankle is healed. 11/29; Charcot right foot wound. This is medially. Wound may be slightly smaller however still raised edges of callus and skin around the wound circumference. We have been using polymen 12/6; Charcot right foot wound. This is medially on the plantar aspect. I did not debride this today wound looks about the same in a week she is reappeared with raised edges of callus and thick skin around the wound. We have been using polymen no real improvement 12/13; again an extensive debridement today. Thick callus skin and subcutaneous tissue around the wound margin with undermining. We have been using silver collagen since last week 12/20; in spite of the debridement last week the wound really looks no difference. Circumferential callus shallow wound granulation does not look too bad. We have been using silver collagen really no improvement 1/10 patient comes in with a wound looking about the same. Circumferential callus and subcutaneous tissue. I did not debride this today we have been using Sorbact and wrapping her leg. Even putting her in a total contact cast  did not help here. She has an appointment with [name] on 2/9. Question of whether a surgical procedure to correct the deformity underneath this area might help. 02/05/2020 upon evaluation today patient actually appears to be stalling to some degree here. Fortunately there is no signs of active infection at this  time. She does have a significant callus buildup and therefore before rewrapping the staff wanted me to see if I could see her and remove some of the callus. I was definitely happy to do that today. Electronic Signature(s) Signed: 02/05/2020 2:53:15 PM By: Lenda Kelp PA-C Entered By: Lenda Kelp on 02/05/2020 14:53:14 -------------------------------------------------------------------------------- Physical Exam Details Patient Name: Date of Service: [facility], Shavona J. 02/05/2020 1:45 PM Medical Record Number: [id number] Patient Account Number: [id number] Date of Birth/Sex: Treating RN: Oct 06, 1955 (65 y.o. [name] Primary Care Provider: MA SO UDI, [place] MA LSA DA T Other Clinician: Referring Provider: Treating Provider/Extender: Lenda Kelp MA SO UDI, [place] MA LSA DA T Weeks in Treatment: 44 Constitutional Well-nourished and well-hydrated in no acute distress. Respiratory normal breathing without difficulty. Psychiatric this patient is able to make decisions and demonstrates good insight into disease process. Alert and Oriented x 3. pleasant and cooperative. Notes Inspection patient's wound bed actually showed signs of some good granulation there was a little bit of slough buildup on the surface of the wound but she has significant callus buildup around the edges of the wound. This did require sharp debridement today to remove some of the callus around the edges as well as the slough on the surface of the wound. She tolerated that today without complication. Post debridement wound bed appears to be doing much better and the callus was essentially lighten down to surface level. Electronic Signature(s) Signed: 02/05/2020 2:53:51 PM By: Lenda Kelp PA-C Entered By: Lenda Kelp on 02/05/2020 14:53:50 -------------------------------------------------------------------------------- Physician Orders Details Patient Name: Date of Service: [facility], Sharah J.  02/05/2020 1:45 PM Medical Record Number: [id number] Patient Account Number: [id number] Date of Birth/Sex: Treating RN: 1955-02-26 (65 y.o. F) [name] Primary Care Provider: MA SO UDI, [place] MA LSA DA T Other Clinician: Referring Provider: Treating Provider/Extender: Lenda Kelp MA SO UDI, [place] MA LSA DA T Weeks in Treatment: 79 Verbal / Phone Orders: No Diagnosis Coding ICD-10 Coding Code Description E11.621 Type 2 diabetes mellitus with foot ulcer L97.511 Non-pressure chronic ulcer of other part of right foot limited to breakdown of skin E11.42 Type 2 diabetes mellitus with diabetic polyneuropathy M14.671 Charcot's joint, right ankle and foot Follow-up Appointments Return Appointment in 1 week. Bathing/ Shower/ Hygiene May shower with protection but do not get wound dressing(s) wet. Edema Control - Lymphedema / SCD / Other Elevate legs to the level of the heart or above for 30 minutes daily and/or when sitting, a frequency of: - throughout the day Avoid standing for long periods of time. Patient to wear own compression stockings every day. - left leg daily Moisturize legs daily. - left leg daily Wound Treatment Wound #9 - Foot Wound Laterality: Right, Medial Cleanser: Wound Cleanser 1 x Per Week/Other:wound center to change weekly Discharge Instructions: Cleanse the wound with wound cleanser prior to applying a clean dressing using gauze sponges, not tissue or cotton balls. Peri-Wound Care: Sween Lotion (Moisturizing lotion) 1 x Per Week/Other:wound center to change weekly Discharge Instructions: Apply moisturizing lotion as directed Prim Dressing: KerraCel Ag Gelling Fiber Dressing, 4x5 in (silver alginate) 1 x Per Week/Other:wound center to change weekly  ary Discharge Instructions: Apply silver alginate to wound bed as instructed Secondary Dressing: ABD Pad, 5x9 1 x Per Week/Other:wound center to change weekly Discharge Instructions: Apply over primary dressing as  directed. Secondary Dressing: Zetuvit Plus 4x8 in 1 x Per Week/Other:wound center to change weekly Discharge Instructions: Apply over primary dressing as directed. Secondary Dressing: Felt 2.5 yds x 5.5 in 1 x Per Week/Other:wound center to change weekly Discharge Instructions: Apply over primary dressing cut to form callous pad around wound Compression Wrap: ThreePress (3 layer compression wrap) 1 x Per Week/Other:wound center to change weekly Discharge Instructions: Apply three layer compression as directed. Electronic Signature(s) Signed: 02/05/2020 5:51:30 PM By: Sarah Deed RN, BSN Signed: 02/05/2020 6:05:28 PM By: Lenda Kelp PA-C Entered By: [name] on 02/05/2020 14:50:38 -------------------------------------------------------------------------------- Problem List Details Patient Name: Date of Service: [facility], Marguerite J. 02/05/2020 1:45 PM Medical Record Number: [id number] Patient Account Number: [id number] Date of Birth/Sex: Treating RN: Jan 27, 1955 (65 y.o. [name] Primary Care Provider: MA SO UDI, Maurice MA LSA DA T Other Clinician: Referring Provider: Treating Provider/Extender: Lenda Kelp MA SO UDI, [place] MA LSA DA T Weeks in Treatment: 40 Active Problems ICD-10 Encounter Code Description Active Date MDM Diagnosis E11.621 Type 2 diabetes mellitus with foot ulcer 01/28/2019 No Yes L97.511 Non-pressure chronic ulcer of other part of right foot limited to breakdown of 01/28/2019 No Yes skin E11.42 Type 2 diabetes mellitus with diabetic polyneuropathy 01/28/2019 No Yes M14.671 Charcot's joint, right ankle and foot 01/28/2019 No Yes Inactive Problems ICD-10 Code Description Active Date Inactive Date L97.524 Non-pressure chronic ulcer of other part of left foot with necrosis of bone 01/28/2019 01/28/2019 L97.514 Non-pressure chronic ulcer of other part of right foot with necrosis of bone 01/28/2019 01/28/2019 W09.811 Charcot's joint, left ankle and foot  01/28/2019 01/28/2019 B14.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 02/05/2020 1:45 PM Medical Record Number: [id number] Patient Account Number: [id number] Date of Birth/Sex: Treating RN: [birth year] (65 y.o. [name] Primary Care Provider: MA SO UDI, Margaret MA LSA DA T Other Clinician: Referring Provider: Treating Provider/Extender: Lenda Kelp MA SO UDI, [place] MA LSA DA T Weeks in Treatment: 28 Subjective Chief Complaint Information obtained from Patient Patients presents for follow up of a diabetic ulcer 01/28/2019; patient is here for review of diabetic foot ulcers bilaterally History of Present Illness (HPI) 65 yrs old bf here for follow up recurrent left charcot foot ulcer. She has DM. She is not a smoker. She states a blister developed 3 months ago at site of previous breakdown from 1 year ago. It was debrided at the podiatrist's office. She went to the ER and then sent here. She denies pain. Xray was negative for osteo. Culture from this clinic negative. 09/08/14 [address] [name] to [name] for Charcot foot. Seen by him and MRI scheduled 09/19/14. Prior silver collagen but this was not ordered last visit, patient has been rinsing with saline alone. Re institute silver collagen every other day. F/u 2 weeks with [name] as Labor Day holiday. Supplies ordered. 09/25/14; this is a patient with a Charcot foot on the left. she has a wound over the plantar aspect of her left calcaneus. She has been seen by [name] of orthopedic surgery and has had  an MRI. She is apparently going within the next week or 2 for corrective surgery which will involve  an external fixator. I don't have any of the details of this. 10/06/14; The patient is a 65 yrs old bf with a left Charcot foot and ulcer on the plantar. 12/01/14 Returns post surgery with [name]. Has follow up visit later this week with him, currently in facility and NWB over this extremity. States no drainage from foot and no current wound care. On exam callus and scab in place, suspect healed. Has external fixator in place. Will defer to [name] for management, ok to place moisturizing cream over scabs and callus and she will call if she requires follow up here. READMISSION 01/28/2019 Patient was in the clinic in 2016 for a wound on the plantar aspect of her left calcaneus. Predominantly cared for by [name] she was referred to [name] and had corrective surgery for the position of her left ankle I believe. She had previously been here in 2015 and then prior to that in 2011 2012 that I do not have these records. More recently she has developed fairly extensive wounds on the right medial foot, left lateral foot and a small area on the right medial great toe. Right medial foot wound has been there for 6 to 7 months, left foot wound for 2 to 3 months and the right great toe only over the last few weeks. She has been followed by [name] at Dr. Laverta [place] office it sounds as though she has been undergoing callus paring and silver nitrate. The patient has been washing these off with soap and water and plan applying dry dressing Past medical history, type 2 diabetes well controlled with a recent hemoglobin A1c of 7 on 11/16, type 2 diabetes with peripheral neuropathy, previous left Charcot joint surgery bilateral Charcot joints currently, stage III chronic renal failure, hypertension, obstructive sleep apnea, history of MRSA and gastroesophageal reflux. 1/18; this is a patient with bilateral Charcot feet. Plain x-rays that I did of both of these wounds that are either at bone or  precariously close to bone were done. On the right foot this showed possible lytic destruction involving the anterior aspect of the talus which may represent a wound osteomyelitis. An MRI was recommended. On the left there was no definite lytic destruction although the wound is deep undermining's and I think probably requires an MRI on this basis in any case 1/25; patient was supposed to have her MRI last Friday of her bilateral feet however they would not do it because there was silver alginate in her dressings, will replace with calcium alginate today and will see if we can get her done today 2/1; patient did not get her MRIs done last week because of issues with the MRI department receiving orders to do bilateral feet. She has 2 deep wounds in the setting of Charcot feet deformities bilaterally. Both of them at one point had exposed bone although I had trouble demonstrating that today. I am not sure that we can offload these very aggressively as I watched her leave the clinic last week her gait is very narrow and unsteady. 2/15; the MRI of her right foot did not show osteomyelitis potentially osteonecrosis of the talus. 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 The wounds are a large wound on the right medial foot and on the left a substantial wound on the left lateral plantar calcaneus. We have  been using silver alginate. Ultimately I think we need to consider a total contact cast on the right while we work through the possibility of osteomyelitis in the left. I watched her walk out with her crutches I have some trepidation about the ability to walk with the cast on her right foot. Nevertheless with her Charcot deformities I do not think there is a way to heal these short of offloading them aggressively. 2/22; the culture of the bone in the left foot that I did last week showed a few Citrobacter Koseri as well as some streptococcal species. In my  attempted bone for pathology did not actually yield a lot of bone the pathologist did not identify any osteomyelitis. 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 Is promised today and aggressive debridement of the right foot and a total contact cast which surprisingly she seemed to tolerate quite well 03/13/2019 upon evaluation today patient appears to be doing well with her total contact cast she is here for the first cast change. She had no areas of rubbing or any other complications at this point. Overall things are doing quite well. 2/26; the patient was kindly seen by Dr. Orvan Falconer of infectious disease on 2/25. She is now on IV vancomycin PICC line placement in 2 days and oral Flagyl. This was in response to her MRI showing osteomyelitis of the left calcaneus concerning for osteomyelitis. We have been putting her right leg in a total contact cast. Our nurses report drainage. She is tolerating the total contact cast well. 3/4; the patient is started her IV antibiotics and is taking IV vancomycin and oral Flagyl. She appears to be tolerating these both well. This is for osteomyelitis involving the left calcaneus. The area on the right in the setting of bilateral Charcot deformities did not have any bone infection on MRI. We put a total contact cast on her with silver alginate as the primary dressing and she returns today in follow-up. Per our intake nurse there was too much drainage to leave this on all week therefore we will bring her back for an early change 3/8; follow-up total contact cast she is still having a lot of drainage probably too much drainage from the right foot wound will week with the same cast. She will be back on Thursday. The area on the left looks somewhat better 3/11; patient here for a total contact cast change. 3/15; patient came  in for wound care evaluation. She is still on IV vancomycin and oral Flagyl for osteomyelitis in the left foot. The area on the right foot we are putting in a total contact cast. 3/18 for total contact cast change on the right foot 3/22; the patient was here for wound review today. The area on the right foot is slightly smaller in terms of surface area. However the surface of the wound is very gritty and hyper granulated. More problematically the area on the plantar left foot has increased depth indeed in the center of this it probes to bone. We have been using Hydrofera Blue in both areas. She is on antibiotics as prescribed by infectious disease IV vancomycin and oral Flagyl 3/25; the patient is in for total contact cast change. Our intake nurse was concerned about the amount of drainage which soaked right to the multiple layers we are putting on this. Wondered  about options. The wound bed actually looks as healthy as this is looked although there may not be a lot of change in dimensions things are looking a lot better. If we are going to heal this woman's wound which is in the middle of her right Charcot foot this is going to need to be offloaded. There are not many alternatives 3/29; still not a lot of improvement although the surface of the right wound looks better. We have been using Hydrofera Blue 04/17/2019 upon evaluation today patient appears to be doing well with a total contact cast. With actually having to change this twice a week. She saw [name] for wound care earlier in the week this is for the cast change today. That is due to the fact that she has significant drainage. Overall the wound is been looking excellent however. 4/5; we continue to put a total contact cast on this patient with Hydrofera Blue. Still a lot of drainage which precludes a simple weekly change or changing this twice a week. 4/8; total contact cast changed today. Apparently the wound looks better per our intake  nurse 4/12; patient here for wound care evaluation. Wounds on the right foot have not changed in dimension none about a month left foot looks similar. Apparently her antibiotics that we are giving her for osteomyelitis in the left foot complete on Wednesday. We have been using Hydrofera Blue on the right foot under a total contact cast. Nurses report greenish drainage although I think this may be discoloration from the [facility] 4/15; back for cast change wounds look quite good although still moderate amount of drainage. T contact cast reapplied otal 4/19;. Wound evaluation. Wound on the right foot is smaller surface looks healthy. There is still the divot in the middle part of this wound but I could not feel any bone. Area on the left foot also looks better She has completed her antibiotics but still has the PICC line in. 4/23; patient comes back for a total contact cast change this was done in the [name] fashion no issues 4/26; wound measures slightly larger on the right foot which is disappointing. She sees Dr. Orvan Falconer with regards to her antibiotics on Wednesday. I believe she is on vancomycin IV and oral Flagyl. 4/29; in for her routine total contact cast change. She saw Dr. Orvan Falconer this morning. He is asking about antibiotic continuation I will be in touch with him. I did not look at her wound today 5/3; patient saw Dr. Orvan Falconer on 4/28. I did send him a note asking about the continuation of perhaps an oral regimen. I have not heard back. She still has an elevated sedimentation rate at 81. Her wounds are smaller. 5/6; in for her total contact cast change. We did not look at the wound today. She has seen Dr. Orvan Falconer and is now on oral Keflex and Flagyl. She seems to be tolerating these well 5/10; she had her PICC line removed. The wound on the right foot after some initial improvement has not made any improvement even with the total contact cast. Still having a lot of drainage.  Likewise the area on the left foot really is not any better either 5/14; in for a cast change today. We will look at the right foot on Monday. If this is not making any progress I am going to try to cast the left foot. 5/17; the wound on the right foot is absolutely no different. I am going to put ointment on  this area and change the cast of the left foot. We have been using silver alginate there 5/21; apparently the area on the right foot was better per our intake nurse although I did not see it. We did a [name] total contact cast change on the left foot we have been applying silver alginate to the wound here. 06/10/19-Area on the right foot per dimensions slightly better, the left foot has still some depth applying silver alginate to the wound with TCC 06/13/19-Patient here for Casting to Left foot 6/1; patient had the total contact cast were placed on the left foot. Noted some swelling in her right leg although the dimensions of the wound on the right foot are better. We have been using PolyMem on the right and silver alginate on the left under the cast 6/7; we continue to make nice progress in both her plantar feet wound. We are putting the total contact cast on the left. Surprisingly in spite of this the area on the right medial foot in the middle of her Charcot deformities actually is making progress as well 07/01/2019 upon evaluation today patient actually appears to be doing quite well with regard to her wounds on the feet compared to when I saw her last that has been quite some time. With that being said I do believe the total contact cast has been beneficial in this left foot and she has a lot of signs of improvement she does have some callus overhanging which is caused a little bit of a ledge and an issue here. I do believe that we may be able to do something to help in that regard. Fortunately otherwise the wound seems to be progressing quite nicely. 6/21; the patient's area on the left  foot that we are currently casting is just about closed. The area on the right medial foot has a healthy looking surface but may be not changed as much from last week in terms of surface area. We are using silver alginate on the right and polymen Ag under the cast 6/28; only a small open area on the left foot remains. Using silver alginate under a total contact cast. She has a diabetic shoe I have asked her to bring that for next week where this may be closed. The area on the right foot with a Charcot deformity is also measuring slightly smaller. We are using PolyMem Ag here 7/6; left foot is totally closed. She does not require a total contact cast. She will graduate to her own diabetic shoe. ooThe area on the right foot with a Charcot deformity measuring about the same. We have been using polymen under compression wraps. She did not do well in this area with a total contact cast 7/13; left foot remains intact. She is skillfully covering this area with foam and using her diabetic shoe. Slight area improvement of the right foot wound. Still rolled senescent looking edges. We have been using polymen 7/19; left foot remains intact. Wound measuring slightly smaller on the right foot in the middle of the Charcot deformity. We have been using PolyMem Ag 7/26; per the patient left foot remains intact. The wound on the right foot looks slightly smaller but dimensions are measuring the same. We have been using polymen Ag 8/9-Patient back after 2 weeks, wound of the right foot again looking slightly smaller, continuing to use PolyMem silver 8/16; patient has made some minor improvements in wound area on the right foot. Using polymen Ag. The left foot remains healed  8/23; continued slight improvement in dimensions. Rolled edges around the wound edge noted. We have been using polymen Ag. Patient offloading with a surgical shoe and crutches 8/30; no change in surface area. Thick rolled skin and subcutaneous  tissue around the edges debris on the wound surface. We have been using polymen Ag initially with some improvement however stalled on the last 2 weeks in terms of surface area 9/13; surface area is smaller still using polymen Ag. Thick rolled skin and subcutaneous tissue around the edges requiring debridement. 9/20; again surface area is improved using polymen Ag however each week she develops thick rolled skin and subcutaneous tissue around the wound edges requiring debridement. She is offloading this using crutches 9/27; we've been using PolyMem AG. Wound does not look any different from last week. Raised edges of callus skin and subcutaneous tissue around the wound bed. 10/5; we been using polymen Ag. No ability to put a total contact cast on today in the clinic. Wound is about the same still rolled edges of thick callus around the margins 10/18; we put her in a total contact cast last week things did not go well. Not only does the wound looked worse macerated angry with thick callus and some warmth around it. She had another wound above this and one on the anterior right leg. I am not totally certain why this happened. She has not been systemically unwell 10/25; culture from last week grew Pseudomonas and MRSA. My experience with this MRSA is usually a true infection and Pseudomonas a colonizer I am going to put her on doxycycline today we put gentamicin on the wound surface. Continuing with silver alginate 11/1; she has tolerated her antibiotics. I gave her doxycycline. Was still using gentamicin. The original wound looks about the same thick rolled edges. The cast injury just above it is superficial 11/8; even after an aggressive debridement last week the wound on her medial Charcot foot on the right looks about the same. The surface looks satisfactory however rolled edges of callus thick skin and subcutaneous tissue. She did not do well on 2 separate occasions in a total contact cast. The  cast abrasion she has on the medial foot is just above closed. We have been using polymen Ag. She is attempting to offload this is much as she can with her crutches 11/15; the wound on the Charcot right foot about the same in terms of surface area.. The area above this on the ankle is healed. 11/29; Charcot right foot wound. This is medially. Wound may be slightly smaller however still raised edges of callus and skin around the wound circumference. We have been using polymen 12/6; Charcot right foot wound. This is medially on the plantar aspect. I did not debride this today wound looks about the same in a week she is reappeared with raised edges of callus and thick skin around the wound. We have been using polymen no real improvement 12/13; again an extensive debridement today. Thick callus skin and subcutaneous tissue around the wound margin with undermining. We have been using silver collagen since last week 12/20; in spite of the debridement last week the wound really looks no difference. Circumferential callus shallow wound granulation does not look too bad. We have been using silver collagen really no improvement 1/10 patient comes in with a wound looking about the same. Circumferential callus and subcutaneous tissue. I did not debride this today we have been using Sorbact and wrapping her leg. Even putting her in a  total contact cast did not help here. She has an appointment with [name] on 2/9. Question of whether a surgical procedure to correct the deformity underneath this area might help. 02/05/2020 upon evaluation today patient actually appears to be stalling to some degree here. Fortunately there is no signs of active infection at this time. She does have a significant callus buildup and therefore before rewrapping the staff wanted me to see if I could see her and remove some of the callus. I was definitely happy to do that today. Objective Constitutional Well-nourished and  well-hydrated in no acute distress. Vitals Time Taken: 1:55 PM, Height: 68 in, Weight: 265 lbs, BMI: 40.3, Temperature: 97.8 F, Pulse: 71 bpm, Respiratory Rate: 18 breaths/min, Blood Pressure: 129/79 mmHg, Capillary Blood Glucose: 127 mg/dl. Respiratory normal breathing without difficulty. Psychiatric this patient is able to make decisions and demonstrates good insight into disease process. Alert and Oriented x 3. pleasant and cooperative. General Notes: Inspection patient's wound bed actually showed signs of some good granulation there was a little bit of slough buildup on the surface of the wound but she has significant callus buildup around the edges of the wound. This did require sharp debridement today to remove some of the callus around the edges as well as the slough on the surface of the wound. She tolerated that today without complication. Post debridement wound bed appears to be doing much better and the callus was essentially lighten down to surface level. Integumentary (Hair, Skin) Wound #9 status is Open. Original cause of wound was Blister. The wound is located on the Right,Medial Foot. The wound measures 2cm length x 2cm width x 1cm depth; 3.142cm^2 area and 3.142cm^3 volume. There is Fat Layer (Subcutaneous Tissue) exposed. There is no tunneling or undermining noted. There is a large amount of serosanguineous drainage noted. The wound margin is thickened. There is large (67-100%) red, pale granulation within the wound bed. There is no necrotic tissue within the wound bed. General Notes: callous to periwound. 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 There was a Excisional Skin/Subcutaneous Tissue Debridement with a total area of 12.25 sq cm performed by Lenda Kelp, PA. With the following instrument(s): Curette to remove Viable and Non-Viable tissue/material. Material removed includes Callus, Subcutaneous Tissue, Slough, and Skin: Epidermis after achieving pain control using Other (Benzocaine 20%). No specimens were taken. A time out was conducted at 14:40, prior to the start of the procedure. A Minimum amount of bleeding was controlled with Pressure. The procedure was tolerated well with a pain level of 0 throughout and a pain level of 0 following the procedure. Post Debridement Measurements: 2cm length x 2cm width x 1cm depth; 3.142cm^3 volume. Character of Wound/Ulcer Post Debridement is improved. Severity of Tissue Post Debridement is: Fat layer exposed. Post procedure Diagnosis Wound #9: Same as Pre-Procedure Pre-procedure diagnosis of Wound #9 is a Diabetic Wound/Ulcer of the Lower Extremity located on the Right,Medial Foot . There was a Three Layer Compression Therapy Procedure by Fonnie Mu, RN. Post procedure Diagnosis Wound #9: Same as Pre-Procedure Plan Follow-up Appointments: Return Appointment in 1 week. Bathing/ Shower/ Hygiene: May shower  with protection but do not get wound dressing(s) wet. Edema Control - Lymphedema / SCD / Other: Elevate legs to the level of the heart or above for 30 minutes daily and/or when sitting, a frequency of: - throughout the day Avoid standing for long periods of time. Patient to wear own compression stockings every day. - left leg daily Moisturize legs daily. - left leg daily WOUND #9: - Foot Wound Laterality: Right, Medial Cleanser: Wound Cleanser 1 x Per Week/Other:wound center to change weekly Discharge Instructions: Cleanse the wound with wound cleanser prior to applying a clean dressing using gauze  sponges, not tissue or cotton balls. Peri-Wound Care: Sween Lotion (Moisturizing lotion) 1 x Per Week/Other:wound center to change weekly Discharge Instructions: Apply moisturizing lotion as directed Prim Dressing: KerraCel Ag Gelling Fiber Dressing, 4x5 in (silver alginate) 1 x Per Week/Other:wound center to change weekly ary Discharge Instructions: Apply silver alginate to wound bed as instructed Secondary Dressing: ABD Pad, 5x9 1 x Per Week/Other:wound center to change weekly Discharge Instructions: Apply over primary dressing as directed. Secondary Dressing: Zetuvit Plus 4x8 in 1 x Per Week/Other:wound center to change weekly Discharge Instructions: Apply over primary dressing as directed. Secondary Dressing: Felt 2.5 yds x 5.5 in 1 x Per Week/Other:wound center to change weekly Discharge Instructions: Apply over primary dressing cut to form callous pad around wound Compression Wrap: ThreePress (3 layer compression wrap) 1 x Per Week/Other:wound center to change weekly Discharge Instructions: Apply three layer compression as directed. 1. Would recommend currently that we going continue with the wound care measures as before with regard to the compression wrap. I do believe this is beneficial for her. We have been utilizing a 3 layer compression wrap. 2. I am also get a switch her to silver alginate as this was extremely wet currently. I think the alginate will do a better job at maintaining some of the moisture control here. Were also using just to bit over top of this along with an ABD pad due to the excessive drainage she has been experiencing. We will see patient back for reevaluation in 1 week here in the clinic. If anything worsens or changes patient will contact our office for additional recommendations. Electronic Signature(s) Signed: 02/05/2020 2:54:44 PM By: Lenda Kelp PA-C Entered By: Lenda Kelp on 02/05/2020  14:54:43 -------------------------------------------------------------------------------- SuperBill Details Patient Name: Date of Service: [name], Aiyanah J. 02/05/2020 Medical Record Number: [id number] Patient Account Number: [id number] Date of Birth/Sex: Treating RN: [birth year] (65 y.o. F) [name] Primary Care Provider: MA SO UDI, [place] LSA DA T Other Clinician: Referring Provider: Treating Provider/Extender: Lenda Kelp MA SO [facility], [place] LSA DA T Weeks in Treatment: 90 Diagnosis Coding ICD-10 Codes Code Description E11.621 Type 2 diabetes mellitus with foot ulcer L97.511 Non-pressure chronic ulcer of other part of right foot limited to breakdown of skin E11.42 Type 2 diabetes mellitus with diabetic polyneuropathy M14.671 Charcot's joint, right ankle and foot Facility Procedures CPT4 Code: [id number] Description: 11042 - DEB SUBQ TISSUE 20 SQ CM/< ICD-10 Diagnosis Description L97.511 Non-pressure chronic ulcer of other part of right foot limited to breakdown of s Modifier: kin Quantity: 1 Physician Procedures : CPT4 Code Description Modifier [id number] 11042 - WC PHYS SUBQ TISS 20 SQ CM ICD-10 Diagnosis Description L97.511 Non-pressure chronic ulcer of other part of right foot limited to breakdown of skin Quantity: 1 Electronic Signature(s) Signed: 02/05/2020 2:55:08 PM By: Lenda Kelp PA-C Entered By: Lenda Kelp on 02/05/2020 14:55:07

## 2020-02-10 ENCOUNTER — Encounter (HOSPITAL_BASED_OUTPATIENT_CLINIC_OR_DEPARTMENT_OTHER): Payer: Medicare Other | Admitting: Internal Medicine

## 2020-02-10 ENCOUNTER — Other Ambulatory Visit: Payer: Self-pay

## 2020-02-10 DIAGNOSIS — E11621 Type 2 diabetes mellitus with foot ulcer: Secondary | ICD-10-CM | POA: Diagnosis not present

## 2020-02-10 NOTE — Progress Notes (Signed)
Sarah, [name] ([id number]) Visit Report for 02/10/2020 HPI Details Patient Name: Date of Service: [name], [name] 02/10/2020 1:30 PM Medical Record Number: [id number] Patient Account Number: [id number] Date of Birth/Sex: Treating RN: January 18, 1956 (65 y.o. [name] Primary Care Provider: MA SO UDI, [place] MA LSA DA T Other Clinician: Referring Provider: Treating Provider/Extender: Baltazar Najjar MA SO UDI, ELHA MA LSA DA T Weeks in Treatment: 42 History of Present Illness HPI Description: 65 yrs old bf here for follow up recurrent left charcot foot ulcer. She has DM. She is not a smoker. She states a blister developed 3 months ago at site of previous breakdown from 1 year ago. It was debrided at the podiatrist's office. She went to the ER and then sent here. She denies pain. Xray was negative for osteo. Culture from this clinic negative. 09/08/14 [name] to [name] for Charcot foot. Seen by him and MRI scheduled 09/19/14. Prior silver collagen but this was not ordered last visit, patient has been rinsing with saline alone. Re institute silver collagen every other day. F/u 2 weeks with [name] as Labor Day holiday. Supplies ordered. 09/25/14; this is a patient with a Charcot foot on the left. she has a wound over the plantar aspect of her left calcaneus. She has been seen by [name] of orthopedic surgery and has had an MRI. She is apparently going within the next week or 2 for corrective surgery which will involve an external fixator. I don't have any of the details of this. 10/06/14; The patient is a 65 yrs old bf with a left Charcot foot and ulcer on the plantar. 12/01/14 Returns post surgery with [name]. Has follow up visit later this week with him, currently in facility and NWB over this extremity. States no drainage from foot and no current wound care. On exam callus and scab in place, suspect healed. Has external fixator in place. Will defer to Dr.  [name] for management, ok to place moisturizing cream over scabs and callus and she will call if she requires follow up here. READMISSION 01/28/2019 Patient was in the clinic in 2016 for a wound on the plantar aspect of her left calcaneus. Predominantly cared for by [name] she was referred to [name] and had corrective surgery for the position of her left ankle I believe. She had previously been here in 2015 and then prior to that in 2011 2012 that I do not have these records. More recently she has developed fairly extensive wounds on the right medial foot, left lateral foot and a small area on the right medial great toe. Right medial foot wound has been there for 6 to 7 months, left foot wound for 2 to 3 months and the right great toe only over the last few weeks. She has been followed by [name] at Dr. Laverta [place] office it sounds as though she has been undergoing callus paring and silver nitrate. The patient has been washing these off with soap and water and plan applying dry dressing Past medical history, type 2 diabetes well controlled with a recent hemoglobin A1c of 7 on 11/16, type 2 diabetes with peripheral neuropathy, previous left Charcot joint surgery bilateral Charcot joints currently, stage III chronic renal failure, hypertension, obstructive sleep apnea, history of MRSA and gastroesophageal reflux. 1/18; this is a patient with bilateral Charcot feet. Plain x-rays that I did of both of these wounds that are either at bone or precariously close to bone  were done. On the right foot this showed possible lytic destruction involving the anterior aspect of the talus which may represent a wound osteomyelitis. An MRI was recommended. On the left there was no definite lytic destruction although the wound is deep undermining's and I think probably requires an MRI on this basis in any case 1/25; patient was supposed to have her MRI last Friday of her bilateral feet however they would  not do it because there was silver alginate in her dressings, will replace with calcium alginate today and will see if we can get her done today 2/1; patient did not get her MRIs done last week because of issues with the MRI department receiving orders to do bilateral feet. She has 2 deep wounds in the setting of Charcot feet deformities bilaterally. Both of them at one point had exposed bone although I had trouble demonstrating that today. I am not sure that we can offload these very aggressively as I watched her leave the clinic last week her gait is very narrow and unsteady. 2/15; the MRI of her right foot did not show osteomyelitis potentially osteonecrosis of the talus. 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 The wounds are a large wound on the right medial foot and on the left a substantial wound on the left lateral plantar calcaneus. We have been using silver alginate. Ultimately I think we need to consider a total contact cast on the right while we work through the possibility of osteomyelitis in the left. I watched her walk out with her crutches I have some trepidation about the ability to walk with the cast on her right foot. Nevertheless with her Charcot deformities I do not think there is a way to heal these short of offloading them aggressively. 2/22; the culture of the bone in the left foot that I did last week showed a few Citrobacter Koseri as well as some streptococcal species. In my attempted bone for pathology did not actually yield a lot of bone the pathologist did not identify any osteomyelitis. 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 Is promised today and aggressive debridement of the right foot and a total contact cast which surprisingly she seemed to tolerate quite well 03/13/2019  upon evaluation today patient appears to be doing well with her total contact cast she is here for the first cast change. She had no areas of rubbing or any other complications at this point. Overall things are doing quite well. 2/26; the patient was kindly seen by Dr. Megan Salon of infectious disease on 2/25. She is now on IV vancomycin PICC line placement in 2 days and oral Flagyl. This was in response to her MRI showing osteomyelitis of the left calcaneus concerning for osteomyelitis. We have been putting her right leg in a total contact cast. Our nurses report drainage. She is tolerating the total contact cast well. 3/4; the patient is started her IV antibiotics and is taking IV vancomycin and oral Flagyl. She appears to be tolerating these both well. This is for osteomyelitis involving the left calcaneus. The area on the right in the setting of bilateral Charcot deformities did not have any bone infection on MRI. We put a total contact cast on her with silver alginate as the primary dressing and she returns today in follow-up. Per  our intake nurse there was too much drainage to leave this on all week therefore we will bring her back for an early change 3/8; follow-up total contact cast she is still having a lot of drainage probably too much drainage from the right foot wound will week with the same cast. She will be back on Thursday. The area on the left looks somewhat better 3/11; patient here for a total contact cast change. 3/15; patient came in for wound care evaluation. She is still on IV vancomycin and oral Flagyl for osteomyelitis in the left foot. The area on the right foot we are putting in a total contact cast. 3/18 for total contact cast change on the right foot 3/22; the patient was here for wound review today. The area on the right foot is slightly smaller in terms of surface area. However the surface of the wound is very gritty and hyper granulated. More problematically the area on  the plantar left foot has increased depth indeed in the center of this it probes to bone. We have been using Hydrofera Blue in both areas. She is on antibiotics as prescribed by infectious disease IV vancomycin and oral Flagyl 3/25; the patient is in for total contact cast change. Our intake nurse was concerned about the amount of drainage which soaked right to the multiple layers we are putting on this. Wondered about options. The wound bed actually looks as healthy as this is looked although there may not be a lot of change in dimensions things are looking a lot better. If we are going to heal this woman's wound which is in the middle of her right Charcot foot this is going to need to be offloaded. There are not many alternatives 3/29; still not a lot of improvement although the surface of the right wound looks better. We have been using Hydrofera Blue 04/17/2019 upon evaluation today patient appears to be doing well with a total contact cast. With actually having to change this twice a week. She saw Dr. Dellia Nims for wound care earlier in the week this is for the cast change today. That is due to the fact that she has significant drainage. Overall the wound is been looking excellent however. 4/5; we continue to put a total contact cast on this patient with Hydrofera Blue. Still a lot of drainage which precludes a simple weekly change or changing this twice a week. 4/8; total contact cast changed today. Apparently the wound looks better per our intake nurse 4/12; patient here for wound care evaluation. Wounds on the right foot have not changed in dimension none about a month left foot looks similar. Apparently her antibiotics that we are giving her for osteomyelitis in the left foot complete on Wednesday. We have been using Hydrofera Blue on the right foot under a total contact cast. Nurses report greenish drainage although I think this may be discoloration from the [facility] 4/15; back for cast  change wounds look quite good although still moderate amount of drainage. T contact cast reapplied otal 4/19;. Wound evaluation. Wound on the right foot is smaller surface looks healthy. There is still the divot in the middle part of this wound but I could not feel any bone. Area on the left foot also looks better She has completed her antibiotics but still has the PICC line in. 4/23; patient comes back for a total contact cast change this was done in the standard fashion no issues 4/26; wound measures slightly larger on the right  foot which is disappointing. She sees Dr. Megan Salon with regards to her antibiotics on Wednesday. I believe she is on vancomycin IV and oral Flagyl. 4/29; in for her routine total contact cast change. She saw Dr. Megan Salon this morning. He is asking about antibiotic continuation I will be in touch with him. I did not look at her wound today 5/3; patient saw Dr. Megan Salon on 4/28. I did send him a note asking about the continuation of perhaps an oral regimen. I have not heard back. She still has an elevated sedimentation rate at 81. Her wounds are smaller. 5/6; in for her total contact cast change. We did not look at the wound today. She has seen Dr. Megan Salon and is now on oral Keflex and Flagyl. She seems to be tolerating these well 5/10; she had her PICC line removed. The wound on the right foot after some initial improvement has not made any improvement even with the total contact cast. Still having a lot of drainage. Likewise the area on the left foot really is not any better either 5/14; in for a cast change today. We will look at the right foot on Monday. If this is not making any progress I am going to try to cast the left foot. 5/17; the wound on the right foot is absolutely no different. I am going to put ointment on this area and change the cast of the left foot. We have been using silver alginate there 5/21; apparently the area on the right foot was better per our  intake nurse although I did not see it. We did a standard total contact cast change on the left foot we have been applying silver alginate to the wound here. 06/10/19-Area on the right foot per dimensions slightly better, the left foot has still some depth applying silver alginate to the wound with TCC 06/13/19-Patient here for Casting to Left foot 6/1; patient had the total contact cast were placed on the left foot. Noted some swelling in her right leg although the dimensions of the wound on the right foot are better. We have been using PolyMem on the right and silver alginate on the left under the cast 6/7; we continue to make nice progress in both her plantar feet wound. We are putting the total contact cast on the left. Surprisingly in spite of this the area on the right medial foot in the middle of her Charcot deformities actually is making progress as well 07/01/2019 upon evaluation today patient actually appears to be doing quite well with regard to her wounds on the feet compared to when I saw her last that has been quite some time. With that being said I do believe the total contact cast has been beneficial in this left foot and she has a lot of signs of improvement she does have some callus overhanging which is caused a little bit of a ledge and an issue here. I do believe that we may be able to do something to help in that regard. Fortunately otherwise the wound seems to be progressing quite nicely. 6/21; the patient's area on the left foot that we are currently casting is just about closed. The area on the right medial foot has a healthy looking surface but may be not changed as much from last week in terms of surface area. We are using silver alginate on the right and polymen Ag under the cast 6/28; only a small open area on the left foot remains. Using silver alginate  under a total contact cast. She has a diabetic shoe I have asked her to bring that for next week where this may be closed.  The area on the right foot with a Charcot deformity is also measuring slightly smaller. We are using PolyMem Ag here 7/6; left foot is totally closed. She does not require a total contact cast. She will graduate to her own diabetic shoe. The area on the right foot with a Charcot deformity measuring about the same. We have been using polymen under compression wraps. She did not do well in this area with a total contact cast 7/13; left foot remains intact. She is skillfully covering this area with foam and using her diabetic shoe. Slight area improvement of the right foot wound. Still rolled senescent looking edges. We have been using polymen 7/19; left foot remains intact. Wound measuring slightly smaller on the right foot in the middle of the Charcot deformity. We have been using PolyMem Ag 7/26; per the patient left foot remains intact. The wound on the right foot looks slightly smaller but dimensions are measuring the same. We have been using polymen Ag 8/9-Patient back after 2 weeks, wound of the right foot again looking slightly smaller, continuing to use PolyMem silver 8/16; patient has made some minor improvements in wound area on the right foot. Using polymen Ag. The left foot remains healed 8/23; continued slight improvement in dimensions. Rolled edges around the wound edge noted. We have been using polymen Ag. Patient offloading with a surgical shoe and crutches 8/30; no change in surface area. Thick rolled skin and subcutaneous tissue around the edges debris on the wound surface. We have been using polymen Ag initially with some improvement however stalled on the last 2 weeks in terms of surface area 9/13; surface area is smaller still using polymen Ag. Thick rolled skin and subcutaneous tissue around the edges requiring debridement. 9/20; again surface area is improved using polymen Ag however each week she develops thick rolled skin and subcutaneous tissue around the wound  edges requiring debridement. She is offloading this using crutches 9/27; we've been using PolyMem AG. Wound does not look any different from last week. Raised edges of callus skin and subcutaneous tissue around the wound bed. 10/5; we been using polymen Ag. No ability to put a total contact cast on today in the clinic. Wound is about the same still rolled edges of thick callus around the margins 10/18; we put her in a total contact cast last week things did not go well. Not only does the wound looked worse macerated angry with thick callus and some warmth around it. She had another wound above this and one on the anterior right leg. I am not totally certain why this happened. She has not been systemically unwell 10/25; culture from last week grew Pseudomonas and MRSA. My experience with this MRSA is usually a true infection and Pseudomonas a colonizer I am going to put her on doxycycline today we put gentamicin on the wound surface. Continuing with silver alginate 11/1; she has tolerated her antibiotics. I gave her doxycycline. Was still using gentamicin. The original wound looks about the same thick rolled edges. The cast injury just above it is superficial 11/8; even after an aggressive debridement last week the wound on her medial Charcot foot on the right looks about the same. The surface looks satisfactory however rolled edges of callus thick skin and subcutaneous tissue. She did not do well on 2 separate occasions in a  total contact cast. The cast abrasion she has on the medial foot is just above closed. We have been using polymen Ag. She is attempting to offload this is much as she can with her crutches 11/15; the wound on the Charcot right foot about the same in terms of surface area.. The area above this on the ankle is healed. 11/29; Charcot right foot wound. This is medially. Wound may be slightly smaller however still raised edges of callus and skin around the wound circumference. We  have been using polymen 12/6; Charcot right foot wound. This is medially on the plantar aspect. I did not debride this today wound looks about the same in a week she is reappeared with raised edges of callus and thick skin around the wound. We have been using polymen no real improvement 12/13; again an extensive debridement today. Thick callus skin and subcutaneous tissue around the wound margin with undermining. We have been using silver collagen since last week 12/20; in spite of the debridement last week the wound really looks no difference. Circumferential callus shallow wound granulation does not look too bad. We have been using silver collagen really no improvement 1/10 patient comes in with a wound looking about the same. Circumferential callus and subcutaneous tissue. I did not debride this today we have been using Sorbact and wrapping her leg. Even putting her in a total contact cast did not help here. She has an appointment with [name] on 2/9. Question of whether a surgical procedure to correct the deformity underneath this area might help. 02/05/2020 upon evaluation today patient actually appears to be stalling to some degree here. Fortunately there is no signs of active infection at this time. She does have a significant callus buildup and therefore before rewrapping the staff wanted me to see if I could see her and remove some of the callus. I was definitely happy to do that today. 1/24; the patient arrives today with a cleaner looking her wound circumference but with the same rolled edges. Change to silver alginate last time because of drainage. Wound surface looks somewhat better although it precariously close to the underlying bone here. Electronic Signature(s) Signed: 02/10/2020 5:37:42 PM By: Baltazar Najjar MD Entered By: Baltazar Najjar on 02/10/2020 14:39:10 -------------------------------------------------------------------------------- Physical Exam Details Patient Name:  Date of Service: [name], Gerene [name]. 02/10/2020 1:30 PM Medical Record Number: [id number] Patient Account Number: [id number] Date of Birth/Sex: Treating RN: 10/28/1955 (65 y.o. [name] Primary Care Provider: MA SO UDI, [place] MA LSA DA T Other Clinician: Referring Provider: Treating Provider/Extender: Baltazar Najjar MA SO UDI, ELHA MA LSA DA T Weeks in Treatment: 84 Constitutional Sitting or standing Blood Pressure is within target range for patient.. Pulse regular and within target range for patient.Marland Kitchen Respirations regular, non-labored and within target range.. Temperature is normal and within the target range for the patient.Marland Kitchen Appears in no distress. Cardiovascular Needle pulses are palpable. Notes Wound exam; granulation looks healthy. Still the same surrounding senescent rolled edges although I did not debride this today. Closer examination reveals no exposed bone no purulence. Underneath this I think there is subluxed bone. Electronic Signature(s) Signed: 02/10/2020 5:37:42 PM By: Baltazar Najjar MD Entered By: Baltazar Najjar on 02/10/2020 14:40:08 -------------------------------------------------------------------------------- Physician Orders Details Patient Name: Date of Service: [facility], Selyna [name]. 02/10/2020 1:30 PM Medical Record Number: [id number] Patient Account Number: [id number] Date of Birth/Sex: Treating RN: 1955-04-02 (65 y.o. F) Zenaida Deed Primary Care Provider: MA SO UDI, ELHA MA LSA DA T Other  Clinician: Referring Provider: Treating Provider/Extender: Baltazar Najjar MA SO UDI, ELHA MA LSA DA T Weeks in Treatment: 41 Verbal / Phone Orders: No Diagnosis Coding ICD-10 Coding Code Description E11.621 Type 2 diabetes mellitus with foot ulcer L97.511 Non-pressure chronic ulcer of other part of right foot limited to breakdown of skin E11.42 Type 2 diabetes mellitus with diabetic polyneuropathy M14.671 Charcot's joint, right ankle and foot Follow-up  Appointments Return Appointment in 1 week. Bathing/ Shower/ Hygiene May shower with protection but do not get wound dressing(s) wet. Edema Control - Lymphedema / SCD / Other Elevate legs to the level of the heart or above for 30 minutes daily and/or when sitting, a frequency of: - throughout the day Avoid standing for long periods of time. Patient to wear own compression stockings every day. - left leg daily Moisturize legs daily. - left leg daily Wound Treatment Wound #9 - Foot Wound Laterality: Right, Medial Cleanser: Wound Cleanser 1 x Per Week/Other:wound center to change weekly Discharge Instructions: Cleanse the wound with wound cleanser prior to applying a clean dressing using gauze sponges, not tissue or cotton balls. Peri-Wound Care: Sween Lotion (Moisturizing lotion) 1 x Per Week/Other:wound center to change weekly Discharge Instructions: Apply moisturizing lotion as directed Prim Dressing: KerraCel Ag Gelling Fiber Dressing, 4x5 in (silver alginate) 1 x Per Week/Other:wound center to change weekly ary Discharge Instructions: Apply silver alginate to wound bed as instructed Secondary Dressing: Zetuvit Plus 4x8 in 1 x Per Week/Other:wound center to change weekly Discharge Instructions: Apply over primary dressing as directed. Secondary Dressing: Felt 2.5 yds x 5.5 in 1 x Per Week/Other:wound center to change weekly Discharge Instructions: Apply over primary dressing cut to form callous pad around wound Compression Wrap: ThreePress (3 layer compression wrap) 1 x Per Week/Other:wound center to change weekly Discharge Instructions: Apply three layer compression as directed. Electronic Signature(s) Signed: 02/10/2020 5:37:42 PM By: Baltazar Najjar MD Signed: 02/10/2020 6:10:15 PM By: Zenaida Deed RN, BSN Entered By: Zenaida Deed on 02/10/2020 14:37:31 -------------------------------------------------------------------------------- Problem List Details Patient Name: Date of  Service: [facility], Daanya [name]. 02/10/2020 1:30 PM Medical Record Number: [id number] Patient Account Number: [id number] Date of Birth/Sex: Treating RN: 1955/12/10 (65 y.o. 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 breakdown of 01/28/2019 No Yes skin E11.42 Type 2 diabetes mellitus with diabetic polyneuropathy 01/28/2019 No Yes M14.671 Charcot's joint, right ankle and foot 01/28/2019 No Yes Inactive Problems ICD-10 Code Description Active Date Inactive Date L97.524 Non-pressure chronic ulcer of other part of left foot with necrosis of bone 01/28/2019 01/28/2019 L97.514 Non-pressure chronic ulcer of other part of right foot with necrosis of bone 01/28/2019 01/28/2019 W09.811 Charcot's joint, left ankle and foot 01/28/2019 01/28/2019 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 02/10/2020 1:30 PM Medical Record Number: [id number] Patient Account Number: [id number] Date of Birth/Sex: Treating RN: 30-Nov-1955 (65 y.o. [name] Primary Care Provider: MA SO UDI, ELHA MA LSA DA T Other Clinician: Referring Provider: Treating Provider/Extender:  Baltazar Najjar MA SO UDI, ELHA MA LSA DA T Weeks in Treatment: 54 Subjective History of Present Illness (HPI) 65 yrs old bf here for follow up  recurrent left charcot foot ulcer. She has DM. She is not a smoker. She states a blister developed 3 months ago at site of previous breakdown from 1 year ago. It was debrided at the podiatrist's office. She went to the ER and then sent here. She denies pain. Xray was negative for osteo. Culture from this clinic negative. 09/08/14 Referred by [name] to [name] for Charcot foot. Seen by him and MRI scheduled 09/19/14. Prior silver collagen but this was not ordered last visit, patient has been rinsing with saline alone. Re institute silver collagen every other day. F/u 2 weeks with [name] as Labor Day holiday. Supplies ordered. 09/25/14; this is a patient with a Charcot foot on the left. she has a wound over the plantar aspect of her left calcaneus. She has been seen by [name] of orthopedic surgery and has had an MRI. She is apparently going within the next week or 2 for corrective surgery which will involve an external fixator. I don't have any of the details of this. 10/06/14; The patient is a 65 yrs old bf with a left Charcot foot and ulcer on the plantar. 12/01/14 Returns post surgery with [name]. Has follow up visit later this week with him, currently in facility and NWB over this extremity. States no drainage from foot and no current wound care. On exam callus and scab in place, suspect healed. Has external fixator in place. Will defer to [name] for management, ok to place moisturizing cream over scabs and callus and she will call if she requires follow up here. READMISSION 01/28/2019 Patient was in the clinic in 2016 for a wound on the plantar aspect of her left calcaneus. Predominantly cared for by [name] she was referred to [name] and had corrective surgery for the position of her left ankle I believe. She had previously been here in 2015 and then prior to that in 2011 2012 that I do not have these records. More recently she has developed fairly extensive wounds on  the right medial foot, left lateral foot and a small area on the right medial great toe. Right medial foot wound has been there for 6 to 7 months, left foot wound for 2 to 3 months and the right great toe only over the last few weeks. She has been followed by [name] at Dr. Laverta [place] office it sounds as though she has been undergoing callus paring and silver nitrate. The patient has been washing these off with soap and water and plan applying dry dressing Past medical history, type 2 diabetes well controlled with a recent hemoglobin A1c of 7 on 11/16, type 2 diabetes with peripheral neuropathy, previous left Charcot joint surgery bilateral Charcot joints currently, stage III chronic renal failure, hypertension, obstructive sleep apnea, history of MRSA and gastroesophageal reflux. 1/18; this is a patient with bilateral Charcot feet. Plain x-rays that I did of both of these wounds that are either at bone or precariously close to bone were done. On the right foot this showed possible lytic destruction involving the anterior aspect of the talus which may represent a wound osteomyelitis. An MRI was recommended. On the left there was no definite lytic destruction although the wound is deep undermining's and I think probably requires an MRI on this basis in any case 1/25;  patient was supposed to have her MRI last Friday of her bilateral feet however they would not do it because there was silver alginate in her dressings, will replace with calcium alginate today and will see if we can get her done today 2/1; patient did not get her MRIs done last week because of issues with the MRI department receiving orders to do bilateral feet. She has 2 deep wounds in the setting of Charcot feet deformities bilaterally. Both of them at one point had exposed bone although I had trouble demonstrating that today. I am not sure that we can offload these very aggressively as I watched her leave the clinic last week her  gait is very narrow and unsteady. 2/15; the MRI of her right foot did not show osteomyelitis potentially osteonecrosis of the talus. 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 The wounds are a large wound on the right medial foot and on the left a substantial wound on the left lateral plantar calcaneus. We have been using silver alginate. Ultimately I think we need to consider a total contact cast on the right while we work through the possibility of osteomyelitis in the left. I watched her walk out with her crutches I have some trepidation about the ability to walk with the cast on her right foot. Nevertheless with her Charcot deformities I do not think there is a way to heal these short of offloading them aggressively. 2/22; the culture of the bone in the left foot that I did last week showed a few Citrobacter Koseri as well as some streptococcal species. In my attempted bone for pathology did not actually yield a lot of bone the pathologist did not identify any osteomyelitis. 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 Is promised today and aggressive debridement of the right foot and a total contact cast which surprisingly she seemed to tolerate quite well 03/13/2019 upon evaluation today patient appears to be doing well with her total contact cast she is here for the first cast change. She had no areas of rubbing or any other complications at this point. Overall things are doing quite well. 2/26; the patient was kindly seen by Dr. Orvan Falconer of infectious disease on 2/25. She is now on IV vancomycin PICC line placement in 2 days and oral Flagyl. This was in response to her MRI showing osteomyelitis of the left calcaneus concerning for osteomyelitis. We have been putting her right leg in a total contact cast.  Our nurses report drainage. She is tolerating the total contact cast well. 3/4; the patient is started her IV antibiotics and is taking IV vancomycin and oral Flagyl. She appears to be tolerating these both well. This is for osteomyelitis involving the left calcaneus. The area on the right in the setting of bilateral Charcot deformities did not have any bone infection on MRI. We put a total contact cast on her with silver alginate as the primary dressing and she returns today in follow-up. Per our intake nurse there was too much drainage to leave this on all week therefore we will bring her back for an early change 3/8; follow-up total contact cast she is still having a lot of drainage probably too much drainage from the right foot wound will week with the same cast. She will be back on  Thursday. The area on the left looks somewhat better 3/11; patient here for a total contact cast change. 3/15; patient came in for wound care evaluation. She is still on IV vancomycin and oral Flagyl for osteomyelitis in the left foot. The area on the right foot we are putting in a total contact cast. 3/18 for total contact cast change on the right foot 3/22; the patient was here for wound review today. The area on the right foot is slightly smaller in terms of surface area. However the surface of the wound is very gritty and hyper granulated. More problematically the area on the plantar left foot has increased depth indeed in the center of this it probes to bone. We have been using Hydrofera Blue in both areas. She is on antibiotics as prescribed by infectious disease IV vancomycin and oral Flagyl 3/25; the patient is in for total contact cast change. Our intake nurse was concerned about the amount of drainage which soaked right to the multiple layers we are putting on this. Wondered about options. The wound bed actually looks as healthy as this is looked although there may not be a lot of change in  dimensions things are looking a lot better. If we are going to heal this woman's wound which is in the middle of her right Charcot foot this is going to need to be offloaded. There are not many alternatives 3/29; still not a lot of improvement although the surface of the right wound looks better. We have been using Hydrofera Blue 04/17/2019 upon evaluation today patient appears to be doing well with a total contact cast. With actually having to change this twice a week. She saw [name] for wound care earlier in the week this is for the cast change today. That is due to the fact that she has significant drainage. Overall the wound is been looking excellent however. 4/5; we continue to put a total contact cast on this patient with Hydrofera Blue. Still a lot of drainage which precludes a simple weekly change or changing this twice a week. 4/8; total contact cast changed today. Apparently the wound looks better per our intake nurse 4/12; patient here for wound care evaluation. Wounds on the right foot have not changed in dimension none about a month left foot looks similar. Apparently her antibiotics that we are giving her for osteomyelitis in the left foot complete on Wednesday. We have been using Hydrofera Blue on the right foot under a total contact cast. Nurses report greenish drainage although I think this may be discoloration from the [facility] 4/15; back for cast change wounds look quite good although still moderate amount of drainage. T contact cast reapplied otal 4/19;. Wound evaluation. Wound on the right foot is smaller surface looks healthy. There is still the divot in the middle part of this wound but I could not feel any bone. Area on the left foot also looks better She has completed her antibiotics but still has the PICC line in. 4/23; patient comes back for a total contact cast change this was done in the standard fashion no issues 4/26; wound measures slightly larger on the  right foot which is disappointing. She sees Dr. Orvan Falconer with regards to her antibiotics on Wednesday. I believe she is on vancomycin IV and oral Flagyl. 4/29; in for her routine total contact cast change. She saw Dr. Orvan Falconer this morning. He is asking about antibiotic continuation I will be in touch with him. I did not look  at her wound today 5/3; patient saw Dr. Orvan Falconer on 4/28. I did send him a note asking about the continuation of perhaps an oral regimen. I have not heard back. She still has an elevated sedimentation rate at 81. Her wounds are smaller. 5/6; in for her total contact cast change. We did not look at the wound today. She has seen Dr. Orvan Falconer and is now on oral Keflex and Flagyl. She seems to be tolerating these well 5/10; she had her PICC line removed. The wound on the right foot after some initial improvement has not made any improvement even with the total contact cast. Still having a lot of drainage. Likewise the area on the left foot really is not any better either 5/14; in for a cast change today. We will look at the right foot on Monday. If this is not making any progress I am going to try to cast the left foot. 5/17; the wound on the right foot is absolutely no different. I am going to put ointment on this area and change the cast of the left foot. We have been using silver alginate there 5/21; apparently the area on the right foot was better per our intake nurse although I did not see it. We did a standard total contact cast change on the left foot we have been applying silver alginate to the wound here. 06/10/19-Area on the right foot per dimensions slightly better, the left foot has still some depth applying silver alginate to the wound with TCC 06/13/19-Patient here for Casting to Left foot 6/1; patient had the total contact cast were placed on the left foot. Noted some swelling in her right leg although the dimensions of the wound on the right foot are better. We have  been using PolyMem on the right and silver alginate on the left under the cast 6/7; we continue to make nice progress in both her plantar feet wound. We are putting the total contact cast on the left. Surprisingly in spite of this the area on the right medial foot in the middle of her Charcot deformities actually is making progress as well 07/01/2019 upon evaluation today patient actually appears to be doing quite well with regard to her wounds on the feet compared to when I saw her last that has been quite some time. With that being said I do believe the total contact cast has been beneficial in this left foot and she has a lot of signs of improvement she does have some callus overhanging which is caused a little bit of a ledge and an issue here. I do believe that we may be able to do something to help in that regard. Fortunately otherwise the wound seems to be progressing quite nicely. 6/21; the patient's area on the left foot that we are currently casting is just about closed. The area on the right medial foot has a healthy looking surface but may be not changed as much from last week in terms of surface area. We are using silver alginate on the right and polymen Ag under the cast 6/28; only a small open area on the left foot remains. Using silver alginate under a total contact cast. She has a diabetic shoe I have asked her to bring that for next week where this may be closed. The area on the right foot with a Charcot deformity is also measuring slightly smaller. We are using PolyMem Ag here 7/6; left foot is totally closed. She does not require a  total contact cast. She will graduate to her own diabetic shoe. ooThe area on the right foot with a Charcot deformity measuring about the same. We have been using polymen under compression wraps. She did not do well in this area with a total contact cast 7/13; left foot remains intact. She is skillfully covering this area with foam and using her diabetic  shoe. Slight area improvement of the right foot wound. Still rolled senescent looking edges. We have been using polymen 7/19; left foot remains intact. Wound measuring slightly smaller on the right foot in the middle of the Charcot deformity. We have been using PolyMem Ag 7/26; per the patient left foot remains intact. The wound on the right foot looks slightly smaller but dimensions are measuring the same. We have been using polymen Ag 8/9-Patient back after 2 weeks, wound of the right foot again looking slightly smaller, continuing to use PolyMem silver 8/16; patient has made some minor improvements in wound area on the right foot. Using polymen Ag. The left foot remains healed 8/23; continued slight improvement in dimensions. Rolled edges around the wound edge noted. We have been using polymen Ag. Patient offloading with a surgical shoe and crutches 8/30; no change in surface area. Thick rolled skin and subcutaneous tissue around the edges debris on the wound surface. We have been using polymen Ag initially with some improvement however stalled on the last 2 weeks in terms of surface area 9/13; surface area is smaller still using polymen Ag. Thick rolled skin and subcutaneous tissue around the edges requiring debridement. 9/20; again surface area is improved using polymen Ag however each week she develops thick rolled skin and subcutaneous tissue around the wound edges requiring debridement. She is offloading this using crutches 9/27; we've been using PolyMem AG. Wound does not look any different from last week. Raised edges of callus skin and subcutaneous tissue around the wound bed. 10/5; we been using polymen Ag. No ability to put a total contact cast on today in the clinic. Wound is about the same still rolled edges of thick callus around the margins 10/18; we put her in a total contact cast last week things did not go well. Not only does the wound looked worse macerated angry with thick  callus and some warmth around it. She had another wound above this and one on the anterior right leg. I am not totally certain why this happened. She has not been systemically unwell 10/25; culture from last week grew Pseudomonas and MRSA. My experience with this MRSA is usually a true infection and Pseudomonas a colonizer I am going to put her on doxycycline today we put gentamicin on the wound surface. Continuing with silver alginate 11/1; she has tolerated her antibiotics. I gave her doxycycline. Was still using gentamicin. The original wound looks about the same thick rolled edges. The cast injury just above it is superficial 11/8; even after an aggressive debridement last week the wound on her medial Charcot foot on the right looks about the same. The surface looks satisfactory however rolled edges of callus thick skin and subcutaneous tissue. She did not do well on 2 separate occasions in a total contact cast. The cast abrasion she has on the medial foot is just above closed. We have been using polymen Ag. She is attempting to offload this is much as she can with her crutches 11/15; the wound on the Charcot right foot about the same in terms of surface area.. The area above this on  the ankle is healed. 11/29; Charcot right foot wound. This is medially. Wound may be slightly smaller however still raised edges of callus and skin around the wound circumference. We have been using polymen 12/6; Charcot right foot wound. This is medially on the plantar aspect. I did not debride this today wound looks about the same in a week she is reappeared with raised edges of callus and thick skin around the wound. We have been using polymen no real improvement 12/13; again an extensive debridement today. Thick callus skin and subcutaneous tissue around the wound margin with undermining. We have been using silver collagen since last week 12/20; in spite of the debridement last week the wound really looks no  difference. Circumferential callus shallow wound granulation does not look too bad. We have been using silver collagen really no improvement 1/10 patient comes in with a wound looking about the same. Circumferential callus and subcutaneous tissue. I did not debride this today we have been using Sorbact and wrapping her leg. Even putting her in a total contact cast did not help here. She has an appointment with [name] on 2/9. Question of whether a surgical procedure to correct the deformity underneath this area might help. 02/05/2020 upon evaluation today patient actually appears to be stalling to some degree here. Fortunately there is no signs of active infection at this time. She does have a significant callus buildup and therefore before rewrapping the staff wanted me to see if I could see her and remove some of the callus. I was definitely happy to do that today. 1/24; the patient arrives today with a cleaner looking her wound circumference but with the same rolled edges. Change to silver alginate last time because of drainage. Wound surface looks somewhat better although it precariously close to the underlying bone here. Objective Constitutional Sitting or standing Blood Pressure is within target range for patient.. Pulse regular and within target range for patient.Marland Kitchen Respirations regular, non-labored and within target range.. Temperature is normal and within the target range for the patient.Marland Kitchen Appears in no distress. Vitals Time Taken: 1:58 PM, Height: 68 in, Weight: 265 lbs, BMI: 40.3, Temperature: 98.4 F, Pulse: 76 bpm, Respiratory Rate: 18 breaths/min, Blood Pressure: 117/75 mmHg, Capillary Blood Glucose: 130 mg/dl. Cardiovascular Needle pulses are palpable. General Notes: Wound exam; granulation looks healthy. Still the same surrounding senescent rolled edges although I did not debride this today. Closer examination reveals no exposed bone no purulence. Underneath this I think there is  subluxed bone. Integumentary (Hair, Skin) Wound #9 status is Open. Original cause of wound was Blister. The wound is located on the Right,Medial Foot. The wound measures 1.5cm length x 1.4cm width x 0.5cm depth; 1.649cm^2 area and 0.825cm^3 volume. There is Fat Layer (Subcutaneous Tissue) exposed. There is no tunneling or undermining noted. There is a medium amount of serosanguineous drainage noted. The wound margin is thickened. There is large (67-100%) red, pale granulation within the wound bed. There is no necrotic tissue within the wound bed. Assessment Active Problems ICD-10 Type 2 diabetes mellitus with foot ulcer Non-pressure chronic ulcer of other part of right foot limited to breakdown of skin Type 2 diabetes mellitus with diabetic polyneuropathy Charcot's joint, right ankle and foot Plan Follow-up Appointments: Return Appointment in 1 week. Bathing/ Shower/ Hygiene: May shower with protection but do not get wound dressing(s) wet. Edema Control - Lymphedema / SCD / Other: Elevate legs to the level of the heart or above for 30 minutes daily and/or when  sitting, a frequency of: - throughout the day Avoid standing for long periods of time. Patient to wear own compression stockings every day. - left leg daily Moisturize legs daily. - left leg daily WOUND #9: - Foot Wound Laterality: Right, Medial Cleanser: Wound Cleanser 1 x Per Week/Other:wound center to change weekly Discharge Instructions: Cleanse the wound with wound cleanser prior to applying a clean dressing using gauze sponges, not tissue or cotton balls. Peri-Wound Care: Sween Lotion (Moisturizing lotion) 1 x Per Week/Other:wound center to change weekly Discharge Instructions: Apply moisturizing lotion as directed Prim Dressing: KerraCel Ag Gelling Fiber Dressing, 4x5 in (silver alginate) 1 x Per Week/Other:wound center to change weekly ary Discharge Instructions: Apply silver alginate to wound bed as instructed Secondary  Dressing: Zetuvit Plus 4x8 in 1 x Per Week/Other:wound center to change weekly Discharge Instructions: Apply over primary dressing as directed. Secondary Dressing: Felt 2.5 yds x 5.5 in 1 x Per Week/Other:wound center to change weekly Discharge Instructions: Apply over primary dressing cut to form callous pad around wound Com pression Wrap: ThreePress (3 layer compression wrap) 1 x Per Week/Other:wound center to change weekly Discharge Instructions: Apply three layer compression as directed. 1. I continued the silver alginate 2. She offloads this with crutches and does the best she can. We have had her in a total contact cast on at least on 2 occasions this did not seem to help. 3. She has an appointment with [name] on February 9 to look at the underlying bone near the see whether bone shaving, surgical procedure would be in order Electronic Signature(s) Signed: 02/10/2020 5:37:42 PM By: Baltazar Najjar MD Entered By: Baltazar Najjar on 02/10/2020 14:41:05 -------------------------------------------------------------------------------- SuperBill Details Patient Name: Date of Service: [name], Tiesha [name]. 02/10/2020 Medical Record Number: [id number] Patient Account Number: [id number] Date of Birth/Sex: Treating RN: 1955-04-29 (65 y.o. 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 M14.671 Charcot's joint, right ankle and foot Facility Procedures CPT4 Code: 28413244 Description: 99213 - WOUND CARE VISIT-LEV 3 EST PT Modifier: Quantity: 1 Physician Procedures : CPT4 Code Description  Modifier [id number] 99213 - WC PHYS LEVEL 3 - EST PT ICD-10 Diagnosis Description E11.621 Type 2 diabetes mellitus with foot ulcer L97.511 Non-pressure chronic ulcer of other part of right foot limited to breakdown of skin E11.42  Type 2 diabetes mellitus with diabetic polyneuropathy M14.671 Charcot's joint, right ankle and foot Quantity: 1 Electronic Signature(s) Signed: 02/10/2020 5:37:42 PM By: Baltazar Najjar MD Entered By: Baltazar Najjar on 02/10/2020 14:41:27

## 2020-02-13 NOTE — Progress Notes (Signed)
Sarah, [name] ([id number]) Visit Report for 02/10/2020 Arrival Information Details Patient Name: Date of Service: Sarah, [name] 02/10/2020 1:30 PM Medical Record Number: [id number] Patient Account Number: [id number] Date of Birth/Sex: Treating RN: 1955-03-15 (65 y.o. [name] Primary Care Contrina Orona: MA SO UDI, [place] MA LSA DA T Other Clinician: Referring Monseratt Ledin: Treating Rosalinda Seaman/Extender: Baltazar Najjar MA SO UDI, ELHA MA LSA DA T Weeks in Treatment: 104 Visit Information History Since Last Visit Added or deleted any medications: No Patient Arrived: Crutches Any new allergies or adverse reactions: No Arrival Time: 13:56 Had a fall or experienced change in No Accompanied By: self activities of daily living that may affect Transfer Assistance: Manual risk of falls: Patient Identification Verified: Yes Signs or symptoms of abuse/neglect since last visito No Secondary Verification Process Completed: Yes Hospitalized since last visit: No Patient Requires Transmission-Based Precautions: No Implantable device outside of the clinic excluding No Patient Has Alerts: No cellular tissue based products placed in the center since last visit: Has Dressing in Place as Prescribed: Yes Pain Present Now: No Electronic Signature(s) Signed: 02/11/2020 11:41:47 AM By: Karl Ito Entered By: Karl Ito on 02/10/2020 13:58:05 -------------------------------------------------------------------------------- Clinic Level of Care Assessment Details Patient Name: Date of Service: Sarah, [name]. 02/10/2020 1:30 PM Medical Record Number: [id number] Patient Account Number: [id number] Date of Birth/Sex: Treating RN: 1955-06-15 (65 y.o. F) Zenaida Deed Primary Care Deairra Halleck: MA SO UDI, [place] MA LSA DA T Other Clinician: Referring Shawnn Bouillon: Treating Eretria Manternach/Extender: Baltazar Najjar MA SO UDI, ELHA MA LSA DA T Weeks in Treatment: 9 Clinic Level of Care Assessment  Items TOOL 4 Quantity Score  - 0 Use when only an EandM is performed on FOLLOW-UP visit ASSESSMENTS - Nursing Assessment / Reassessment X- 1 10 Reassessment of Co-morbidities (includes updates in patient status) X- 1 5 Reassessment of Adherence to Treatment Plan ASSESSMENTS - Wound and Skin A ssessment / Reassessment X - Simple Wound Assessment / Reassessment - one wound 1 5  - 0 Complex Wound Assessment / Reassessment - multiple wounds  - 0 Dermatologic / Skin Assessment (not related to wound area) ASSESSMENTS - Focused Assessment  - 0 Circumferential Edema Measurements - multi extremities  - 0 Nutritional Assessment / Counseling / Intervention X- 1 5 Lower Extremity Assessment (monofilament, tuning fork, pulses)  - 0 Peripheral Arterial Disease Assessment (using hand held doppler) ASSESSMENTS - Ostomy and/or Continence Assessment and Care  - 0 Incontinence Assessment and Management  - 0 Ostomy Care Assessment and Management (repouching, etc.) PROCESS - Coordination of Care X - Simple Patient / Family Education for ongoing care 1 15  - 0 Complex (extensive) Patient / Family Education for ongoing care X- 1 10 Staff obtains Chiropractor, Records, T Results / Process Orders est  - 0 Staff telephones HHA, Nursing Homes / Clarify orders / etc  - 0 Routine Transfer to another Facility (non-emergent condition)  - 0 Routine Hospital Admission (non-emergent condition)  - 0 New Admissions / Manufacturing engineer / Ordering NPWT Apligraf, etc. ,  - 0 Emergency Hospital Admission (emergent condition) X- 1 10 Simple Discharge Coordination  - 0 Complex (extensive) Discharge Coordination PROCESS - Special Needs  - 0 Pediatric / Minor Patient Management  - 0 Isolation Patient Management  - 0 Hearing / Language / Visual special needs  - 0 Assessment of Community assistance (transportation, D/C planning, etc.)  - 0 Additional  assistance / Altered mentation  - 0 Support Surface(s) Assessment (bed, cushion, seat, etc.) INTERVENTIONS - Wound  Cleansing / Measurement X - Simple Wound Cleansing - one wound 1 5 []  - 0 Complex Wound Cleansing - multiple wounds X- 1 5 Wound Imaging (photographs - any number of wounds) []  - 0 Wound Tracing (instead of photographs) X- 1 5 Simple Wound Measurement - one wound []  - 0 Complex Wound Measurement - multiple wounds INTERVENTIONS - Wound Dressings X - Small Wound Dressing one or multiple wounds 1 10 []  - 0 Medium Wound Dressing one or multiple wounds []  - 0 Large Wound Dressing one or multiple wounds X- 1 5 Application of Medications - topical []  - 0 Application of Medications - injection INTERVENTIONS - Miscellaneous []  - 0 External ear exam []  - 0 Specimen Collection (cultures, biopsies, blood, body fluids, etc.) []  - 0 Specimen(s) / Culture(s) sent or taken to Lab for analysis []  - 0 Patient Transfer (multiple staff / / Similar devices) []  - 0 Simple Staple / Suture removal (25 or less) []  - 0 Complex Staple / Suture removal (26 or more) []  - 0 Hypo / Hyperglycemic Management (close monitor of Blood Glucose) []  - 0 Ankle / Brachial Index (ABI) - do not check if billed separately X- 1 5 Vital Signs Has the patient been seen at the hospital within the last three years: Yes Total Score: 95 Level Of Care: New/Established - Level 3 Electronic Signature(s) Signed: 02/10/2020 6:10:15 PM By: RN, BSN Entered By: on 02/10/2020 14:35:43 -------------------------------------------------------------------------------- Encounter Discharge Information Details Patient Name: Date of Service: [facility], [name] 02/10/2020 1:30 PM Medical Record Number: Patient Account Number: Date of Birth/Sex: Treating RN: 03-16-1955 (65 y.o. Primary Care Ziyon Cedotal: MA SO UDI, MA LSA DA T Other  Clinician: Referring Nuala Chiles: Treating Naeema Patlan/Extender: MA SO UDI, ELHA MA LSA DA T Weeks in Treatment: 3 Encounter Discharge Information Items Discharge Condition: Stable Ambulatory Status: Crutches Discharge Destination: Home Transportation: Private Auto Accompanied By: self Schedule Follow-up Appointment: Yes Clinical Summary of Care: Electronic Signature(s) Signed: 02/10/2020 6:39:14 PM By: Zenaida Deed Entered By: Zenaida Deed on 02/10/2020 15:25:31 -------------------------------------------------------------------------------- Lower Extremity Assessment Details Patient Name: Date of Service: KAMELA, BLANSETT. 02/10/2020 1:30 PM Medical Record Number: [id number] Patient Account Number: [id number] Date of Birth/Sex: Treating RN: July 07, 1955 (65 y.o. [name] Primary Care Kevonna Nolte: MA SO UDI, [place] MA LSA DA T Other Clinician: Referring Katy Brickell: Treating Shaunn Tackitt/Extender: Baltazar Najjar MA SO UDI, ELHA MA LSA DA T Weeks in Treatment: 54 Edema Assessment Assessed: [Left: No] [Right: Yes] Edema: [Left: N] [Right: o] Calf Left: Right: Point of Measurement: 43 cm From Medial Instep 33.5 cm Ankle Left: Right: Point of Measurement: 12 cm From Medial Instep 23 cm Vascular Assessment Pulses: Dorsalis Pedis Palpable: [Right:Yes] Electronic Signature(s) Signed: 02/10/2020 6:39:14 PM By: 02/12/2020 Entered By: Shawn Stall on 02/10/2020 14:16:22 -------------------------------------------------------------------------------- Multi Wound Chart Details Patient Name: Date of Service: ENO [facility], [name] 02/10/2020 1:30 PM Medical Record Number: 02/12/2020 Patient Account Number: [id number] Date of Birth/Sex: Treating RN: 06-Feb-1955 (65 y.o. 77 Primary Care Deondria Puryear: MA SO UDI, [place] MA LSA DA T Other Clinician: Referring Darl Brisbin: Treating Carnel Stegman/Extender: [place] MA SO UDI, ELHA MA LSA DA T Weeks in Treatment:  1 Vital Signs Height(in): 68 Capillary Blood Glucose(mg/dl): 02/12/2020 Weight(lbs): Shawn Stall Pulse(bpm): 76 Body Mass Index(BMI): 40 Blood Pressure(mmHg): 117/75 Temperature(F): 98.4 Respiratory Rate(breaths/min): 18 Photos: [9:No Photos Right, Medial Foot] [N/A:N/A N/A] Wound Location: [9:Blister] [N/A:N/A] Wounding Event: [9:Diabetic Wound/Ulcer of  the Lower] [N/A:N/A] Primary Etiology: [9:Extremity Glaucoma, Chronic sinus] [N/A:N/A] Comorbid History: [9:problems/congestion, Anemia, Sleep Apnea, Hypertension, Type II Diabetes, Rheumatoid Arthritis, Neuropathy 06/18/2018] [N/A:N/A] Date Acquired: [9:54] [N/A:N/A] Weeks of Treatment: [9:Open] [N/A:N/A] Wound Status: [9:1.5x1.4x0.5] [N/A:N/A] Measurements L x W x D (cm) [9:1.649] [N/A:N/A] A (cm) : rea [9:0.825] [N/A:N/A] Volume (cm) : [9:85.00%] [N/A:N/A] % Reduction in A rea: [9:91.70%] [N/A:N/A] % Reduction in Volume: [9:Grade 2] [N/A:N/A] Classification: [9:Medium] [N/A:N/A] Exudate A mount: [9:Serosanguineous] [N/A:N/A] Exudate Type: [9:red, brown] [N/A:N/A] Exudate Color: [9:Thickened] [N/A:N/A] Wound Margin: [9:Large (67-100%)] [N/A:N/A] Granulation A mount: [9:Red, Pale] [N/A:N/A] Granulation Quality: [9:None Present (0%)] [N/A:N/A] Necrotic A mount: [9:Fat Layer (Subcutaneous Tissue): Yes N/A] Exposed Structures: [9:Fascia: No Tendon: No Muscle: No Joint: No Bone: No None] [N/A:N/A] Treatment Notes Electronic Signature(s) Signed: 02/10/2020 5:37:42 PM By: Baltazar Najjar MD Signed: 02/10/2020 6:10:15 PM By: Zenaida Deed RN, BSN Entered By: Baltazar Najjar on 02/10/2020 14:37:56 -------------------------------------------------------------------------------- Multi-Disciplinary Care Plan Details Patient Name: Date of Service: [name], Derrika J. 02/10/2020 1:30 PM Medical Record Number: [id number] Patient Account Number: [id number] Date of Birth/Sex: Treating RN: [birth year] (65 y.o. [name] Primary Care  Jaymes Revels: MA SO UDI, [place] MA LSA DA T Other Clinician: Referring Kayzen Kendzierski: Treating Zanaya Baize/Extender: Baltazar Najjar MA SO UDI, ELHA MA LSA DA T Weeks in Treatment: 65 Active Inactive Wound/Skin Impairment Nursing Diagnoses: Impaired tissue integrity Knowledge deficit related to ulceration/compromised skin integrity Goals: Patient/caregiver will verbalize understanding of skin care regimen Date Initiated: 01/28/2019 Target Resolution Date: 02/28/2020 Goal Status: Active Interventions: Assess patient/caregiver ability to obtain necessary supplies Assess patient/caregiver ability to perform ulcer/skin care regimen upon admission and as needed Assess ulceration(s) every visit Provide education on ulcer and skin care Notes: Electronic Signature(s) Signed: 02/10/2020 6:10:15 PM By: Zenaida Deed RN, BSN Entered By: Zenaida Deed on 02/10/2020 14:34:17 -------------------------------------------------------------------------------- Pain Assessment Details Patient Name: Date of Service: [name], Avy J. 02/10/2020 1:30 PM Medical Record Number: [id number] Patient Account Number: [id number] Date of Birth/Sex: Treating RN: 1955/06/21 (65 y.o. [name] Primary Care Hilaria Titsworth: MA SO UDI, [place] MA LSA DA T Other Clinician: Referring Claressa Hughley: Treating Lillyian Heidt/Extender: Baltazar Najjar MA SO UDI, ELHA MA LSA DA T Weeks in Treatment: 33 Active Problems Location of Pain Severity and Description of Pain Patient Has Paino No Site Locations Pain Management and Medication Current Pain Management: Electronic Signature(s) Signed: 02/11/2020 11:41:47 AM By: Karl Ito Signed: 02/13/2020 5:48:06 PM By: Zandra Abts RN, BSN Entered By: Karl Ito on 02/10/2020 13:59:15 -------------------------------------------------------------------------------- Patient/Caregiver Education Details Patient Name: Date of Service: [name] 1/24/2022andnbsp1:30  PM Medical Record Number: [id number] Patient Account Number: [id number] Date of Birth/Gender: Treating RN: [birth year] (65 y.o. [name] Primary Care Physician: MA SO UDI, [place] MA LSA DA T Other Clinician: Referring Physician: Treating Physician/Extender: Baltazar Najjar MA SO UDI, ELHA MA LSA DA T Weeks in Treatment: 32 Education Assessment Education Provided To: Patient Education Topics Provided Offloading: Methods: Explain/Verbal Responses: Reinforcements needed, State content correctly Wound/Skin Impairment: Methods: Explain/Verbal Responses: Reinforcements needed, State content correctly Electronic Signature(s) Signed: 02/10/2020 6:10:15 PM By: Zenaida Deed RN, BSN Entered By: Zenaida Deed on 02/10/2020 14:35:06 -------------------------------------------------------------------------------- Wound Assessment Details Patient Name: Date of Service: [name], [name] 02/10/2020 1:30 PM Medical Record Number: [id number] Patient Account Number: [id number] Date of Birth/Sex: Treating RN: 12-08-1955 (65 y.o. [name] Primary Care Keeara Frees: MA SO UDI, Judene Companion MA LSA DA T Other Clinician: Referring Taje Littler: Treating Ifeanyi Mickelson/Extender: Abe People, ELHA MA LSA DA T Tania Ade  in Treatment: 54 Wound Status Wound Number: 9 Primary Diabetic Wound/Ulcer of the Lower Extremity Etiology: Wound Location: Right, Medial Foot Wound Open Wounding Event: Blister Status: Date Acquired: 06/18/2018 Comorbid Glaucoma, Chronic sinus problems/congestion, Anemia, Sleep Weeks Of Treatment: 54 History: Apnea, Hypertension, Type II Diabetes, Rheumatoid Arthritis, Clustered Wound: No Neuropathy Photos Photo Uploaded By: Benjaman Kindler on 02/11/2020 13:52:32 Wound Measurements Length: (cm) 1.5 Width: (cm) 1.4 Depth: (cm) 0.5 Area: (cm) 1.649 Volume: (cm) 0.825 % Reduction in Area: 85% % Reduction in Volume: 91.7% Epithelialization: None Tunneling:  No Undermining: No Wound Description Classification: Grade 2 Wound Margin: Thickened Exudate Amount: Medium Exudate Type: Serosanguineous Exudate Color: red, brown Foul Odor After Cleansing: No Slough/Fibrino No Wound Bed Granulation Amount: Large (67-100%) Exposed Structure Granulation Quality: Red, Pale Fascia Exposed: No Necrotic Amount: None Present (0%) Fat Layer (Subcutaneous Tissue) Exposed: Yes Tendon Exposed: No Muscle Exposed: No Joint Exposed: No Bone Exposed: No Treatment Notes Wound #9 (Foot) Wound Laterality: Right, Medial Cleanser Wound Cleanser Discharge Instruction: Cleanse the wound with wound cleanser prior to applying a clean dressing using gauze sponges, not tissue or cotton balls. Peri-Wound Care Sween Lotion (Moisturizing lotion) Discharge Instruction: Apply moisturizing lotion as directed Topical Primary Dressing KerraCel Ag Gelling Fiber Dressing, 4x5 in (silver alginate) Discharge Instruction: Apply silver alginate to wound bed as instructed Secondary Dressing Zetuvit Plus 4x8 in Discharge Instruction: Apply over primary dressing as directed. Felt 2.5 yds x 5.5 in Discharge Instruction: Apply over primary dressing cut to form callous pad around wound Secured With Compression Wrap ThreePress (3 layer compression wrap) Discharge Instruction: Apply three layer compression as directed. Compression Stockings Add-Ons Electronic Signature(s) Signed: 02/10/2020 6:39:14 PM By: Shawn Stall Signed: 02/13/2020 5:48:06 PM By: Zandra Abts RN, BSN Entered By: Shawn Stall on 02/10/2020 14:17:26 -------------------------------------------------------------------------------- Vitals Details Patient Name: Date of Service: [name], [name] 02/10/2020 1:30 PM Medical Record Number: [id number] Patient Account Number: [id number] Date of Birth/Sex: Treating RN: Jan 21, 1955 (64 y.o. [name] Primary Care Jaki Steptoe: MA SO UDI, [place] MA LSA DA  T Other Clinician: Referring Chealsey Miyamoto: Treating Brinton Brandel/Extender: Baltazar Najjar MA SO UDI, ELHA MA LSA DA T Weeks in Treatment: 69 Vital Signs Time Taken: 13:58 Temperature (F): 98.4 Height (in): 68 Pulse (bpm): 76 Weight (lbs): 265 Respiratory Rate (breaths/min): 18 Body Mass Index (BMI): 40.3 Blood Pressure (mmHg): 117/75 Capillary Blood Glucose (mg/dl): 413 Reference Range: 80 - 120 mg / dl Electronic Signature(s) Signed: 02/11/2020 11:41:47 AM By: Karl Ito Entered By: Karl Ito on 02/10/2020 13:58:41

## 2020-02-17 ENCOUNTER — Encounter (HOSPITAL_BASED_OUTPATIENT_CLINIC_OR_DEPARTMENT_OTHER): Payer: Medicare Other | Admitting: Internal Medicine

## 2020-02-17 ENCOUNTER — Other Ambulatory Visit: Payer: Self-pay

## 2020-02-17 DIAGNOSIS — E11621 Type 2 diabetes mellitus with foot ulcer: Secondary | ICD-10-CM | POA: Diagnosis not present

## 2020-02-24 ENCOUNTER — Other Ambulatory Visit: Payer: Self-pay

## 2020-02-24 ENCOUNTER — Encounter (HOSPITAL_BASED_OUTPATIENT_CLINIC_OR_DEPARTMENT_OTHER): Payer: Medicare Other | Attending: Internal Medicine | Admitting: Internal Medicine

## 2020-02-24 DIAGNOSIS — L97511 Non-pressure chronic ulcer of other part of right foot limited to breakdown of skin: Secondary | ICD-10-CM | POA: Diagnosis not present

## 2020-02-24 DIAGNOSIS — E11621 Type 2 diabetes mellitus with foot ulcer: Secondary | ICD-10-CM | POA: Insufficient documentation

## 2020-02-24 DIAGNOSIS — M069 Rheumatoid arthritis, unspecified: Secondary | ICD-10-CM | POA: Insufficient documentation

## 2020-02-24 DIAGNOSIS — I129 Hypertensive chronic kidney disease with stage 1 through stage 4 chronic kidney disease, or unspecified chronic kidney disease: Secondary | ICD-10-CM | POA: Insufficient documentation

## 2020-02-24 DIAGNOSIS — E1142 Type 2 diabetes mellitus with diabetic polyneuropathy: Secondary | ICD-10-CM | POA: Diagnosis not present

## 2020-02-24 DIAGNOSIS — E1122 Type 2 diabetes mellitus with diabetic chronic kidney disease: Secondary | ICD-10-CM | POA: Diagnosis not present

## 2020-02-24 DIAGNOSIS — N183 Chronic kidney disease, stage 3 unspecified: Secondary | ICD-10-CM | POA: Diagnosis not present

## 2020-02-24 DIAGNOSIS — E11610 Type 2 diabetes mellitus with diabetic neuropathic arthropathy: Secondary | ICD-10-CM | POA: Diagnosis not present

## 2020-02-25 NOTE — Progress Notes (Signed)
[name], [name] ([id number]) Visit Report for 02/05/2020 Arrival Information Details Patient Name: Date of Service: [name], [name] 02/05/2020 1:45 PM Medical Record Number: [id number] Patient Account Number: [id number] Date of Birth/Sex: Treating RN: Aug 30, 1955 (65 y.o. F) Zenaida Deed Primary Care Granvel Proudfoot: MA SO UDI, [place] MA LSA DA T Other Clinician: Referring Samika Vetsch: Treating Yeison Sippel/Extender: Lenda Kelp MA SO UDI, [place] MA LSA DA T Weeks in Treatment: 54 Visit Information History Since Last Visit Added or deleted any medications: No Patient Arrived: Crutches Any new allergies or adverse reactions: No Arrival Time: 13:55 Had a fall or experienced change in No Accompanied By: self activities of daily living that may affect Transfer Assistance: None risk of falls: Patient Identification Verified: Yes Signs or symptoms of abuse/neglect since last visito No Secondary Verification Process Completed: Yes Hospitalized since last visit: No Patient Requires Transmission-Based Precautions: No Implantable device outside of the clinic excluding No Patient Has Alerts: No cellular tissue based products placed in the center since last visit: Has Dressing in Place as Prescribed: Yes Pain Present Now: No Electronic Signature(s) Signed: 02/05/2020 1:56:32 PM By: Karl Ito Entered By: Karl Ito on 02/05/2020 13:55:38 -------------------------------------------------------------------------------- Compression Therapy Details Patient Name: Date of Service: [name], [name] 02/05/2020 1:45 PM Medical Record Number: [id number] Patient Account Number: [id number] Date of Birth/Sex: Treating RN: Dec 03, 1955 (65 y.o. [name] Primary Care Huyen Perazzo: MA SO UDI, [place] MA LSA DA T Other Clinician: Referring Kayia Billinger: Treating Pessy Delamar/Extender: Lenda Kelp MA SO UDI, ELHA MA LSA DA T Weeks in Treatment: 77 Compression Therapy Performed for Wound  Assessment: Wound #9 Right,Medial Foot Performed By: Clinician Fonnie Mu, RN Compression Type: Three Layer Post Procedure Diagnosis Same as Pre-procedure Electronic Signature(s) Signed: 02/05/2020 5:51:30 PM By: Zenaida Deed RN, BSN Entered By: Zenaida Deed on 02/05/2020 14:51:22 -------------------------------------------------------------------------------- Encounter Discharge Information Details Patient Name: Date of Service: [name], [name] 02/05/2020 1:45 PM Medical Record Number: [id number] Patient Account Number: [id number] Date of Birth/Sex: Treating RN: December 25, 1955 (65 y.o. [name] Primary Care Marshae Azam: MA SO UDI, [place] MA LSA DA T Other Clinician: Referring Shylin Keizer: Treating Tryone Kille/Extender: Lenda Kelp MA SO UDI, ELHA MA LSA DA T Weeks in Treatment: 66 Encounter Discharge Information Items Post Procedure Vitals Discharge Condition: Stable Temperature (F): 97.8 Ambulatory Status: Crutches Pulse (bpm): 71 Discharge Destination: Home Respiratory Rate (breaths/min): 18 Transportation: Private Auto Blood Pressure (mmHg): 129/79 Accompanied By: self Schedule Follow-up Appointment: Yes Clinical Summary of Care: Electronic Signature(s) Signed: 02/05/2020 5:28:00 PM By: Shawn Stall Entered By: Shawn Stall on 02/05/2020 15:13:24 -------------------------------------------------------------------------------- Lower Extremity Assessment Details Patient Name: Date of Service: [name], [name] 02/05/2020 1:45 PM Medical Record Number: [id number] Patient Account Number: [id number] Date of Birth/Sex: Treating RN: Apr 28, 1955 (65 y.o. [name] Primary Care Bao Bazen: MA SO UDI, [place] MA LSA DA T Other Clinician: Referring Daje Stark: Treating Tylor Gambrill/Extender: Lenda Kelp MA SO UDI, ELHA MA LSA DA T Weeks in Treatment: 5 Edema Assessment Assessed: [Left: No] [Right: Yes] Edema: [Left: N] [Right: o] Calf Left: Right: Point  of Measurement: 43 cm From Medial Instep 35 cm Ankle Left: Right: Point of Measurement: 12 cm From Medial Instep 25 cm Vascular Assessment Pulses: Dorsalis Pedis Palpable: [Right:Yes] Electronic Signature(s) Signed: 02/05/2020 5:28:00 PM By: Shawn Stall Entered By: Shawn Stall on 02/05/2020 14:07:32 -------------------------------------------------------------------------------- Multi-Disciplinary Care Plan Details Patient Name: Date of Service: [name], [name] 02/05/2020 1:45 PM Medical Record Number: [id number] Patient Account Number: [id number] Date of Birth/Sex: Treating  RN: [birth year] (65 y.o. [name] Primary Care Pedro Whiters: MA SO UDI, [place] MA LSA DA T Other Clinician: Referring Odena Mcquaid: Treating Anara Cowman/Extender: Lenda Kelp MA SO UDI, [place] MA LSA DA T Weeks in Treatment: 75 Active Inactive Wound/Skin Impairment Nursing Diagnoses: Impaired tissue integrity Knowledge deficit related to ulceration/compromised skin integrity Goals: Patient/caregiver will verbalize understanding of skin care regimen Date Initiated: 01/28/2019 Target Resolution Date: 02/28/2020 Goal Status: Active Interventions: Assess patient/caregiver ability to obtain necessary supplies Assess patient/caregiver ability to perform ulcer/skin care regimen upon admission and as needed Assess ulceration(s) every visit Provide education on ulcer and skin care Notes: Electronic Signature(s) Signed: 02/05/2020 5:51:30 PM By: Zenaida Deed RN, BSN Entered By: Zenaida Deed on 02/05/2020 14:39:40 -------------------------------------------------------------------------------- Pain Assessment Details Patient Name: Date of Service: [name], [name] 02/05/2020 1:45 PM Medical Record Number: [id number] Patient Account Number: [id number] Date of Birth/Sex: Treating RN: 1955/02/06 (65 y.o. [name] Primary Care Taijon Vink: MA SO UDI, Judene Companion MA LSA DA T Other  Clinician: Referring Falon Flinchum: Treating Rosalin Buster/Extender: Lenda Kelp MA SO UDI, ELHA MA LSA DA T Weeks in Treatment: 82 Active Problems Location of Pain Severity and Description of Pain Patient Has Paino No Site Locations Pain Management and Medication Current Pain Management: Electronic Signature(s) Signed: 02/25/2020 4:03:25 PM By: Zandra Abts RN, BSN Entered By: Zandra Abts on 02/05/2020 14:25:54 -------------------------------------------------------------------------------- Patient/Caregiver Education Details Patient Name: Date of Service: [name] 1/19/2022andnbsp1:45 PM Medical Record Number: [id number] Patient Account Number: [id number] Date of Birth/Gender: Treating RN: 1955/02/09 (65 y.o. [name] Primary Care Physician: MA SO UDI, [place] MA LSA DA T Other Clinician: Referring Physician: Treating Physician/Extender: Lenda Kelp MA SO UDI, ELHA MA LSA DA T Weeks in Treatment: 6 Education Assessment Education Provided To: Patient Education Topics Provided Offloading: Methods: Explain/Verbal Responses: Reinforcements needed, State content correctly Wound/Skin Impairment: Methods: Explain/Verbal Responses: Reinforcements needed, State content correctly Electronic Signature(s) Signed: 02/05/2020 5:51:30 PM By: Zenaida Deed RN, BSN Entered By: Zenaida Deed on 02/05/2020 14:40:16 -------------------------------------------------------------------------------- Wound Assessment Details Patient Name: Date of Service: [name], [name] 02/05/2020 1:45 PM Medical Record Number: [id number] Patient Account Number: [id number] Date of Birth/Sex: Treating RN: Jul 19, 1955 (65 y.o. [name] Primary Care Jolyn Deshmukh: MA SO UDI, [place] MA LSA DA T Other Clinician: Referring Madox Corkins: Treating Tayson Schnelle/Extender: Lenda Kelp MA SO UDI, [place] MA LSA DA T Weeks in Treatment: 73 Wound Status Wound Number: 9 Primary Diabetic  Wound/Ulcer of the Lower Extremity Etiology: Wound Location: Right, Medial Foot Wound Open Wounding Event: Blister Status: Date Acquired: 06/18/2018 Comorbid Glaucoma, Chronic sinus problems/congestion, Anemia, Sleep Weeks Of Treatment: 53 History: Apnea, Hypertension, Type II Diabetes, Rheumatoid Arthritis, Clustered Wound: No Neuropathy Photos Photo Uploaded By: Benjaman Kindler on 02/12/2020 11:10:46 Wound Measurements Length: (cm) 2 Width: (cm) 2 Depth: (cm) 1 Area: (cm) 3.142 Volume: (cm) 3.142 % Reduction in Area: 71.4% % Reduction in Volume: 68.2% Epithelialization: None Tunneling: No Undermining: No Wound Description Classification: Grade 2 Wound Margin: Thickened Exudate Amount: Large Exudate Type: Serosanguineous Exudate Color: red, brown Foul Odor After Cleansing: No Slough/Fibrino No Wound Bed Granulation Amount: Large (67-100%) Exposed Structure Granulation Quality: Red, Pale Fascia Exposed: No Necrotic Amount: None Present (0%) Fat Layer (Subcutaneous Tissue) Exposed: Yes Tendon Exposed: No Muscle Exposed: No Joint Exposed: No Bone Exposed: No Assessment Notes callous to periwound. Electronic Signature(s) Signed: 02/05/2020 5:28:00 PM By: Shawn Stall Previous Signature: 02/05/2020 1:56:32 PM Version By: Karl Ito Entered By: Shawn Stall on 02/05/2020 14:09:29 --------------------------------------------------------------------------------  Vitals Details Patient Name: Date of Service: [name], [name] 02/05/2020 1:45 PM Medical Record Number: [id number] Patient Account Number: [id number] Date of Birth/Sex: Treating RN: [birth year] (65 y.o. [name] Primary Care Kacey Dysert: MA SO UDI, [place] MA LSA DA T Other Clinician: Referring Yasmine Kilbourne: Treating Raywood Wailes/Extender: Lenda Kelp MA SO UDI, [place] MA LSA DA T Weeks in Treatment: 49 Vital Signs Time Taken: 13:55 Temperature (F): 97.8 Height (in): 68 Pulse (bpm): 71 Weight  (lbs): 265 Respiratory Rate (breaths/min): 18 Body Mass Index (BMI): 40.3 Blood Pressure (mmHg): 129/79 Capillary Blood Glucose (mg/dl): 416 Reference Range: 80 - 120 mg / dl Electronic Signature(s) Signed: 02/05/2020 1:56:32 PM By: Karl Ito Entered By: Karl Ito on 02/05/2020 13:55:59

## 2020-02-25 NOTE — Progress Notes (Signed)
HANNE, KEGG ([id number]) Visit Report for 02/24/2020 HPI Details Patient Name: Date of Service: KEYLEN, UZELAC 02/24/2020 1:30 PM Medical Record Number: [id number] Patient Account Number: [id number] Date of Birth/Sex: Treating RN: [birth year] (65 y.o. [name] Primary Care Provider: MA SO UDI, [place] MA LSA DA T Other Clinician: Referring Provider: Treating Provider/Extender: Linton Ham MA SO UDI, ELHA MA LSA DA T Weeks in Treatment: 10 History of Present Illness HPI Description: 65 yrs old bf here for follow up recurrent left charcot foot ulcer. She has DM. She is not a smoker. She states a blister developed 3 months ago at site of previous breakdown from 1 year ago. It was debrided at the podiatrist's office. She went to the ER and then sent here. She denies pain. Xray was negative for osteo. Culture from this clinic negative. 09/08/14 [name] to [name] for Charcot foot. Seen by him and MRI scheduled 09/19/14. Prior silver collagen but this was not ordered last visit, patient has been rinsing with saline alone. Re institute silver collagen every other day. F/u 2 weeks with Dr. Dellia Nims as Labor Day holiday. Supplies ordered. 09/25/14; this is a patient with a Charcot foot on the left. she has a wound over the plantar aspect of her left calcaneus. She has been seen by [name] of orthopedic surgery and has had an MRI. She is apparently going within the next week or 2 for corrective surgery which will involve an external fixator. I don't have any of the details of this. 10/06/14; The patient is a 65 yrs old bf with a left Charcot foot and ulcer on the plantar. 12/01/14 Returns post surgery with [name]. Has follow up visit later this week with him, currently in facility and NWB over this extremity. States no drainage from foot and no current wound care. On exam callus and scab in place, suspect healed. Has external fixator in place. Will defer to [name]  for management, ok to place moisturizing cream over scabs and callus and she will call if she requires follow up here. READMISSION 01/28/2019 Patient was in the clinic in 2016 for a wound on the plantar aspect of her left calcaneus. Predominantly cared for by Dr. [name] she was referred to [name] and had corrective surgery for the position of her left ankle I believe. She had previously been here in 2015 and then prior to that in 2011 2012 that I do not have these records. More recently she has developed fairly extensive wounds on the right medial foot, left lateral foot and a small area on the right medial great toe. Right medial foot wound has been there for 6 to 7 months, left foot wound for 2 to 3 months and the right great toe only over the last few weeks. She has been followed by [name] at Dr. [name] office it sounds as though she has been undergoing callus paring and silver nitrate. The patient has been washing these off with soap and water and plan applying dry dressing Past medical history, type 2 diabetes well controlled with a recent hemoglobin A1c of 7 on 11/16, type 2 diabetes with peripheral neuropathy, previous left Charcot joint surgery bilateral Charcot joints currently, stage III chronic renal failure, hypertension, obstructive sleep apnea, history of MRSA and gastroesophageal reflux. 1/18; this is a patient with bilateral Charcot feet. Plain x-rays that I did of both of these wounds that are either at bone or precariously close to bone  were done. On the right foot this showed possible lytic destruction involving the anterior aspect of the talus which may represent a wound osteomyelitis. An MRI was recommended. On the left there was no definite lytic destruction although the wound is deep undermining's and I think probably requires an MRI on this basis in any case 1/25; patient was supposed to have her MRI last Friday of her bilateral feet however they would not do  it because there was silver alginate in her dressings, will replace with calcium alginate today and will see if we can get her done today 2/1; patient did not get her MRIs done last week because of issues with the MRI department receiving orders to do bilateral feet. She has 2 deep wounds in the setting of Charcot feet deformities bilaterally. Both of them at one point had exposed bone although I had trouble demonstrating that today. I am not sure that we can offload these very aggressively as I watched her leave the clinic last week her gait is very narrow and unsteady. 2/15; the MRI of her right foot did not show osteomyelitis potentially osteonecrosis of the talus. 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 The wounds are a large wound on the right medial foot and on the left a substantial wound on the left lateral plantar calcaneus. We have been using silver alginate. Ultimately I think we need to consider a total contact cast on the right while we work through the possibility of osteomyelitis in the left. I watched her walk out with her crutches I have some trepidation about the ability to walk with the cast on her right foot. Nevertheless with her Charcot deformities I do not think there is a way to heal these short of offloading them aggressively. 2/22; the culture of the bone in the left foot that I did last week showed a few Citrobacter Koseri as well as some streptococcal species. In my attempted bone for pathology did not actually yield a lot of bone the pathologist did not identify any osteomyelitis. 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 Is promised today and aggressive debridement of the right foot and a total contact cast which surprisingly she seemed to tolerate quite well 03/13/2019 upon  evaluation today patient appears to be doing well with her total contact cast she is here for the first cast change. She had no areas of rubbing or any other complications at this point. Overall things are doing quite well. 2/26; the patient was kindly seen by Dr. Megan Salon of infectious disease on 2/25. She is now on IV vancomycin PICC line placement in 2 days and oral Flagyl. This was in response to her MRI showing osteomyelitis of the left calcaneus concerning for osteomyelitis. We have been putting her right leg in a total contact cast. Our nurses report drainage. She is tolerating the total contact cast well. 3/4; the patient is started her IV antibiotics and is taking IV vancomycin and oral Flagyl. She appears to be tolerating these both well. This is for osteomyelitis involving the left calcaneus. The area on the right in the setting of bilateral Charcot deformities did not have any bone infection on MRI. We put a total contact cast on her with silver alginate as the primary dressing and she returns today in follow-up. Per  our intake nurse there was too much drainage to leave this on all week therefore we will bring her back for an early change 3/8; follow-up total contact cast she is still having a lot of drainage probably too much drainage from the right foot wound will week with the same cast. She will be back on Thursday. The area on the left looks somewhat better 3/11; patient here for a total contact cast change. 3/15; patient came in for wound care evaluation. She is still on IV vancomycin and oral Flagyl for osteomyelitis in the left foot. The area on the right foot we are putting in a total contact cast. 3/18 for total contact cast change on the right foot 3/22; the patient was here for wound review today. The area on the right foot is slightly smaller in terms of surface area. However the surface of the wound is very gritty and hyper granulated. More problematically the area on the  plantar left foot has increased depth indeed in the center of this it probes to bone. We have been using Hydrofera Blue in both areas. She is on antibiotics as prescribed by infectious disease IV vancomycin and oral Flagyl 3/25; the patient is in for total contact cast change. Our intake nurse was concerned about the amount of drainage which soaked right to the multiple layers we are putting on this. Wondered about options. The wound bed actually looks as healthy as this is looked although there may not be a lot of change in dimensions things are looking a lot better. If we are going to heal this woman's wound which is in the middle of her right Charcot foot this is going to need to be offloaded. There are not many alternatives 3/29; still not a lot of improvement although the surface of the right wound looks better. We have been using Hydrofera Blue 04/17/2019 upon evaluation today patient appears to be doing well with a total contact cast. With actually having to change this twice a week. She saw Dr. Dellia Nims for wound care earlier in the week this is for the cast change today. That is due to the fact that she has significant drainage. Overall the wound is been looking excellent however. 4/5; we continue to put a total contact cast on this patient with Hydrofera Blue. Still a lot of drainage which precludes a simple weekly change or changing this twice a week. 4/8; total contact cast changed today. Apparently the wound looks better per our intake nurse 4/12; patient here for wound care evaluation. Wounds on the right foot have not changed in dimension none about a month left foot looks similar. Apparently her antibiotics that we are giving her for osteomyelitis in the left foot complete on Wednesday. We have been using Hydrofera Blue on the right foot under a total contact cast. Nurses report greenish drainage although I think this may be discoloration from the [facility] 4/15; back for cast  change wounds look quite good although still moderate amount of drainage. T contact cast reapplied otal 4/19;. Wound evaluation. Wound on the right foot is smaller surface looks healthy. There is still the divot in the middle part of this wound but I could not feel any bone. Area on the left foot also looks better She has completed her antibiotics but still has the PICC line in. 4/23; patient comes back for a total contact cast change this was done in the standard fashion no issues 4/26; wound measures slightly larger on the right  foot which is disappointing. She sees Dr. Megan Salon with regards to her antibiotics on Wednesday. I believe she is on vancomycin IV and oral Flagyl. 4/29; in for her routine total contact cast change. She saw Dr. Megan Salon this morning. He is asking about antibiotic continuation I will be in touch with him. I did not look at her wound today 5/3; patient saw Dr. Megan Salon on 4/28. I did send him a note asking about the continuation of perhaps an oral regimen. I have not heard back. She still has an elevated sedimentation rate at 81. Her wounds are smaller. 5/6; in for her total contact cast change. We did not look at the wound today. She has seen Dr. Megan Salon and is now on oral Keflex and Flagyl. She seems to be tolerating these well 5/10; she had her PICC line removed. The wound on the right foot after some initial improvement has not made any improvement even with the total contact cast. Still having a lot of drainage. Likewise the area on the left foot really is not any better either 5/14; in for a cast change today. We will look at the right foot on Monday. If this is not making any progress I am going to try to cast the left foot. 5/17; the wound on the right foot is absolutely no different. I am going to put ointment on this area and change the cast of the left foot. We have been using silver alginate there 5/21; apparently the area on the right foot was better per our  intake nurse although I did not see it. We did a standard total contact cast change on the left foot we have been applying silver alginate to the wound here. 06/10/19-Area on the right foot per dimensions slightly better, the left foot has still some depth applying silver alginate to the wound with TCC 06/13/19-Patient here for Casting to Left foot 6/1; patient had the total contact cast were placed on the left foot. Noted some swelling in her right leg although the dimensions of the wound on the right foot are better. We have been using PolyMem on the right and silver alginate on the left under the cast 6/7; we continue to make nice progress in both her plantar feet wound. We are putting the total contact cast on the left. Surprisingly in spite of this the area on the right medial foot in the middle of her Charcot deformities actually is making progress as well 07/01/2019 upon evaluation today patient actually appears to be doing quite well with regard to her wounds on the feet compared to when I saw her last that has been quite some time. With that being said I do believe the total contact cast has been beneficial in this left foot and she has a lot of signs of improvement she does have some callus overhanging which is caused a little bit of a ledge and an issue here. I do believe that we may be able to do something to help in that regard. Fortunately otherwise the wound seems to be progressing quite nicely. 6/21; the patient's area on the left foot that we are currently casting is just about closed. The area on the right medial foot has a healthy looking surface but may be not changed as much from last week in terms of surface area. We are using silver alginate on the right and polymen Ag under the cast 6/28; only a small open area on the left foot remains. Using silver alginate  under a total contact cast. She has a diabetic shoe I have asked her to bring that for next week where this may be closed.  The area on the right foot with a Charcot deformity is also measuring slightly smaller. We are using PolyMem Ag here 7/6; left foot is totally closed. She does not require a total contact cast. She will graduate to her own diabetic shoe. The area on the right foot with a Charcot deformity measuring about the same. We have been using polymen under compression wraps. She did not do well in this area with a total contact cast 7/13; left foot remains intact. She is skillfully covering this area with foam and using her diabetic shoe. Slight area improvement of the right foot wound. Still rolled senescent looking edges. We have been using polymen 7/19; left foot remains intact. Wound measuring slightly smaller on the right foot in the middle of the Charcot deformity. We have been using PolyMem Ag 7/26; per the patient left foot remains intact. The wound on the right foot looks slightly smaller but dimensions are measuring the same. We have been using polymen Ag 8/9-Patient back after 2 weeks, wound of the right foot again looking slightly smaller, continuing to use PolyMem silver 8/16; patient has made some minor improvements in wound area on the right foot. Using polymen Ag. The left foot remains healed 8/23; continued slight improvement in dimensions. Rolled edges around the wound edge noted. We have been using polymen Ag. Patient offloading with a surgical shoe and crutches 8/30; no change in surface area. Thick rolled skin and subcutaneous tissue around the edges debris on the wound surface. We have been using polymen Ag initially with some improvement however stalled on the last 2 weeks in terms of surface area 9/13; surface area is smaller still using polymen Ag. Thick rolled skin and subcutaneous tissue around the edges requiring debridement. 9/20; again surface area is improved using polymen Ag however each week she develops thick rolled skin and subcutaneous tissue around the wound  edges requiring debridement. She is offloading this using crutches 9/27; we've been using PolyMem AG. Wound does not look any different from last week. Raised edges of callus skin and subcutaneous tissue around the wound bed. 10/5; we been using polymen Ag. No ability to put a total contact cast on today in the clinic. Wound is about the same still rolled edges of thick callus around the margins 10/18; we put her in a total contact cast last week things did not go well. Not only does the wound looked worse macerated angry with thick callus and some warmth around it. She had another wound above this and one on the anterior right leg. I am not totally certain why this happened. She has not been systemically unwell 10/25; culture from last week grew Pseudomonas and MRSA. My experience with this MRSA is usually a true infection and Pseudomonas a colonizer I am going to put her on doxycycline today we put gentamicin on the wound surface. Continuing with silver alginate 11/1; she has tolerated her antibiotics. I gave her doxycycline. Was still using gentamicin. The original wound looks about the same thick rolled edges. The cast injury just above it is superficial 11/8; even after an aggressive debridement last week the wound on her medial Charcot foot on the right looks about the same. The surface looks satisfactory however rolled edges of callus thick skin and subcutaneous tissue. She did not do well on 2 separate occasions in a  total contact cast. The cast abrasion she has on the medial foot is just above closed. We have been using polymen Ag. She is attempting to offload this is much as she can with her crutches 11/15; the wound on the Charcot right foot about the same in terms of surface area.. The area above this on the ankle is healed. 11/29; Charcot right foot wound. This is medially. Wound may be slightly smaller however still raised edges of callus and skin around the wound circumference. We  have been using polymen 12/6; Charcot right foot wound. This is medially on the plantar aspect. I did not debride this today wound looks about the same in a week she is reappeared with raised edges of callus and thick skin around the wound. We have been using polymen no real improvement 12/13; again an extensive debridement today. Thick callus skin and subcutaneous tissue around the wound margin with undermining. We have been using silver collagen since last week 12/20; in spite of the debridement last week the wound really looks no difference. Circumferential callus shallow wound granulation does not look too bad. We have been using silver collagen really no improvement 1/10 patient comes in with a wound looking about the same. Circumferential callus and subcutaneous tissue. I did not debride this today we have been using Sorbact and wrapping her leg. Even putting her in a total contact cast did not help here. She has an appointment with [name] on 2/9. Question of whether a surgical procedure to correct the deformity underneath this area might help. 02/05/2020 upon evaluation today patient actually appears to be stalling to some degree here. Fortunately there is no signs of active infection at this time. She does have a significant callus buildup and therefore before rewrapping the staff wanted me to see if I could see her and remove some of the callus. I was definitely happy to do that today. 1/24; the patient arrives today with a cleaner looking her wound circumference but with the same rolled edges. Change to silver alginate last time because of drainage. Wound surface looks somewhat better although it precariously close to the underlying bone here. 1/31; she arrives with a continued improvement in the wound circumference less callus less thick skin. I changed her back to silver collagen today as the surface appears to be improved and the granulation looks stable. She sees [name] on  2/9 2/7; patient sees [name] on 2/9. Arrives with a considerable amount of thick callus around this wound the area is essentially unchanged she has been using silver collagen Electronic Signature(s) Signed: 02/25/2020 8:19:11 AM By: Linton Ham MD Entered By: Linton Ham on 02/24/2020 14:34:33 -------------------------------------------------------------------------------- Physical Exam Details Patient Name: Date of Service: Yolanda Manges, Charde J. 02/24/2020 1:30 PM Medical Record Number: [id number] Patient Account Number: [id number] Date of Birth/Sex: Treating RN: July 14, 1955 (65 y.o. [name] Primary Care Provider: MA SO UDI, [place] MA LSA DA T Other Clinician: Referring Provider: Treating Provider/Extender: Linton Ham MA SO UDI, ELHA MA LSA DA T Weeks in Treatment: 56 Cardiovascular Pedal pulses are palpable. Notes Wound exam; really the surface of the wound looks unchanged. Thick circumferential callus and skin around the margins is back exactly as we are used to seeing this. Although I debrided this many times its never made any difference in getting the area to epithelialize. Underneath this I think she has an subluxed bone edgeo Cuboid Electronic Signature(s) Signed: 02/25/2020 8:19:11 AM By: Linton Ham MD Entered By: Linton Ham  on 02/24/2020 14:35:39 -------------------------------------------------------------------------------- Physician Orders Details Patient Name: Date of Service: TAMISHA, NORDSTROM 02/24/2020 1:30 PM Medical Record Number: [id number] Patient Account Number: [id number] Date of Birth/Sex: Treating RN: 06-04-1955 (65 y.o. [name] Primary Care Provider: MA SO UDI, [place] MA LSA DA T Other Clinician: Referring Provider: Treating Provider/Extender: Linton Ham MA SO UDI, ELHA MA LSA DA T Weeks in Treatment: 32 Verbal / Phone Orders: No Diagnosis Coding ICD-10 Coding Code Description E11.621 Type 2 diabetes mellitus  with foot ulcer L97.511 Non-pressure chronic ulcer of other part of right foot limited to breakdown of skin E11.42 Type 2 diabetes mellitus with diabetic polyneuropathy M14.671 Charcot's joint, right ankle and foot Follow-up Appointments Return Appointment in 1 week. Bathing/ Shower/ Hygiene May shower with protection but do not get wound dressing(s) wet. Edema Control - Lymphedema / SCD / Other Elevate legs to the level of the heart or above for 30 minutes daily and/or when sitting, a frequency of: - throughout the day Avoid standing for long periods of time. Patient to wear own compression stockings every day. - left leg daily Moisturize legs daily. - left leg daily Wound Treatment Wound #9 - Foot Wound Laterality: Right, Medial Cleanser: Soap and Water Every Other Day/7 Days Discharge Instructions: May shower and wash wound with dial antibacterial soap and water prior to dressing change. Cleanser: [place] Ancillary Kit - 15 Day Supply (DME) (Generic) Every Other Day/7 Days Discharge Instructions: Use supplies as instructed; Kit contains: (15) Saline Bullets; (15) 3x3 Gauze; 15 pr Gloves Peri-Wound Care: Sween Lotion (Moisturizing lotion) Every Other Day/7 Days Discharge Instructions: Apply moisturizing lotion as directed Prim Dressing: Promogran Prisma Matrix, 4.34 (sq in) (silver collagen) (DME) (Generic) Every Other Day/7 Days ary Discharge Instructions: Moisten collagen with saline or hydrogel Secondary Dressing: Woven Gauze Sponge, Non-Sterile 4x4 in (DME) (Generic) Every Other Day/7 Days Discharge Instructions: Apply over primary dressing as directed. Secondary Dressing: ABD Pad, 5x9 (DME) Every Other Day/7 Days Discharge Instructions: Apply over primary dressing as directed. Secondary Dressing: Felt 2.5 yds x 5.5 in Every Other Day/7 Days Discharge Instructions: Apply over primary dressing cut to form callous pad around wound Secured With: Kerlix Roll Sterile, 4.5x3.1 (in/yd)  (DME) (Generic) Every Other Day/7 Days Discharge Instructions: Secure with Kerlix as directed. Secured With: Paper Tape, 2x10 (in/yd) (DME) (Generic) Every Other Day/7 Days Discharge Instructions: Secure dressing with tape as directed. Electronic Signature(s) Signed: 02/24/2020 4:14:28 PM By: Levan Hurst RN, BSN Signed: 02/25/2020 8:19:11 AM By: Linton Ham MD Entered By: Levan Hurst on 02/24/2020 14:07:40 -------------------------------------------------------------------------------- Problem List Details Patient Name: Date of Service: [name], Lokelani J. 02/24/2020 1:30 PM Medical Record Number: [id number] Patient Account Number: [id number] Date of Birth/Sex: Treating RN: February 26, 1955 (65 y.o. [name] Primary Care Provider: MA SO UDI, [place] MA LSA DA T Other Clinician: Referring Provider: Treating Provider/Extender: Marlynn Perking, ELHA MA LSA DA T Weeks in Treatment: 8 Active Problems ICD-10 Encounter Code Description Active Date MDM Diagnosis E11.621 Type 2 diabetes mellitus with foot ulcer 01/28/2019 No Yes L97.511 Non-pressure chronic ulcer of other part of right foot limited to breakdown of 01/28/2019 No Yes skin E11.42 Type 2 diabetes mellitus with diabetic polyneuropathy 01/28/2019 No Yes M14.671 Charcot's joint, right ankle and foot 01/28/2019 No Yes Inactive Problems ICD-10 Code Description Active Date Inactive Date L97.524 Non-pressure chronic ulcer of other part of left foot with necrosis of bone 01/28/2019 01/28/2019 L97.514 Non-pressure chronic ulcer of other part of right foot with  necrosis of bone 01/28/2019 01/28/2019 H88.502 Charcot's joint, left ankle and foot 01/28/2019 01/28/2019 D74.128 Other chronic osteomyelitis, left ankle and foot 04/08/2019 04/08/2019 Resolved Problems Electronic Signature(s) Signed: 02/25/2020 8:19:11 AM By: Linton Ham MD Entered By: Linton Ham on 02/24/2020  14:33:49 -------------------------------------------------------------------------------- Progress Note Details Patient Name: Date of Service: Yolanda Manges, Kristan J. 02/24/2020 1:30 PM Medical Record Number: [id number] Patient Account Number: [id number] Date of Birth/Sex: Treating RN: [birth year] (65 y.o. [name] Primary Care Provider: MA SO UDI, [place] MA LSA DA T Other Clinician: Referring Provider: Treating Provider/Extender: Linton Ham MA SO UDI, ELHA MA LSA DA T Weeks in Treatment: 29 Subjective History of Present Illness (HPI) 65 yrs old bf here for follow up recurrent left charcot foot ulcer. She has DM. She is not a smoker. She states a blister developed 3 months ago at site of previous breakdown from 1 year ago. It was debrided at the podiatrist's office. She went to the ER and then sent here. She denies pain. Xray was negative for osteo. Culture from this clinic negative. 09/08/14 [address] [name] to [name] for Charcot foot. Seen by him and MRI scheduled 09/19/14. Prior silver collagen but this was not ordered last visit, patient has been rinsing with saline alone. Re institute silver collagen every other day. F/u 2 weeks with Dr. Dellia Nims as Labor Day holiday. Supplies ordered. 09/25/14; this is a patient with a Charcot foot on the left. she has a wound over the plantar aspect of her left calcaneus. She has been seen by [name] of orthopedic surgery and has had an MRI. She is apparently going within the next week or 2 for corrective surgery which will involve an external fixator. I don't have any of the details of this. 10/06/14; The patient is a 65 yrs old bf with a left Charcot foot and ulcer on the plantar. 12/01/14 Returns post surgery with [name]. Has follow up visit later this week with him, currently in facility and NWB over this extremity. States no drainage from foot and no current wound care. On exam callus and scab in place, suspect healed. Has external  fixator in place. Will defer to [name] for management, ok to place moisturizing cream over scabs and callus and she will call if she requires follow up here. READMISSION 01/28/2019 Patient was in the clinic in 2016 for a wound on the plantar aspect of her left calcaneus. Predominantly cared for by Dr. [name] she was referred to [name] and had corrective surgery for the position of her left ankle I believe. She had previously been here in 2015 and then prior to that in 2011 2012 that I do not have these records. More recently she has developed fairly extensive wounds on the right medial foot, left lateral foot and a small area on the right medial great toe. Right medial foot wound has been there for 6 to 7 months, left foot wound for 2 to 3 months and the right great toe only over the last few weeks. She has been followed by [name] at Dr. [name] office it sounds as though she has been undergoing callus paring and silver nitrate. The patient has been washing these off with soap and water and plan applying dry dressing Past medical history, type 2 diabetes well controlled with a recent hemoglobin A1c of 7 on 11/16, type 2 diabetes with peripheral neuropathy, previous left Charcot joint surgery bilateral Charcot joints currently, stage III chronic renal failure, hypertension,  obstructive sleep apnea, history of MRSA and gastroesophageal reflux. 1/18; this is a patient with bilateral Charcot feet. Plain x-rays that I did of both of these wounds that are either at bone or precariously close to bone were done. On the right foot this showed possible lytic destruction involving the anterior aspect of the talus which may represent a wound osteomyelitis. An MRI was recommended. On the left there was no definite lytic destruction although the wound is deep undermining's and I think probably requires an MRI on this basis in any case 1/25; patient was supposed to have her MRI last Friday of her  bilateral feet however they would not do it because there was silver alginate in her dressings, will replace with calcium alginate today and will see if we can get her done today 2/1; patient did not get her MRIs done last week because of issues with the MRI department receiving orders to do bilateral feet. She has 2 deep wounds in the setting of Charcot feet deformities bilaterally. Both of them at one point had exposed bone although I had trouble demonstrating that today. I am not sure that we can offload these very aggressively as I watched her leave the clinic last week her gait is very narrow and unsteady. 2/15; the MRI of her right foot did not show osteomyelitis potentially osteonecrosis of the talus. 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 The wounds are a large wound on the right medial foot and on the left a substantial wound on the left lateral plantar calcaneus. We have been using silver alginate. Ultimately I think we need to consider a total contact cast on the right while we work through the possibility of osteomyelitis in the left. I watched her walk out with her crutches I have some trepidation about the ability to walk with the cast on her right foot. Nevertheless with her Charcot deformities I do not think there is a way to heal these short of offloading them aggressively. 2/22; the culture of the bone in the left foot that I did last week showed a few Citrobacter Koseri as well as some streptococcal species. In my attempted bone for pathology did not actually yield a lot of bone the pathologist did not identify any osteomyelitis. 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 Is promised today and aggressive debridement of the right foot and a total contact cast which surprisingly she seemed  to tolerate quite well 03/13/2019 upon evaluation today patient appears to be doing well with her total contact cast she is here for the first cast change. She had no areas of rubbing or any other complications at this point. Overall things are doing quite well. 2/26; the patient was kindly seen by Dr. Megan Salon of infectious disease on 2/25. She is now on IV vancomycin PICC line placement in 2 days and oral Flagyl. This was in response to her MRI showing osteomyelitis of the left calcaneus concerning for osteomyelitis. We have been putting her right leg in a total contact cast. Our nurses report drainage. She is tolerating the total contact cast well. 3/4; the patient is started her IV antibiotics and is taking IV vancomycin and oral Flagyl. She appears to be tolerating these both well. This is for osteomyelitis involving the left calcaneus. The area on the  right in the setting of bilateral Charcot deformities did not have any bone infection on MRI. We put a total contact cast on her with silver alginate as the primary dressing and she returns today in follow-up. Per our intake nurse there was too much drainage to leave this on all week therefore we will bring her back for an early change 3/8; follow-up total contact cast she is still having a lot of drainage probably too much drainage from the right foot wound will week with the same cast. She will be back on Thursday. The area on the left looks somewhat better 3/11; patient here for a total contact cast change. 3/15; patient came in for wound care evaluation. She is still on IV vancomycin and oral Flagyl for osteomyelitis in the left foot. The area on the right foot we are putting in a total contact cast. 3/18 for total contact cast change on the right foot 3/22; the patient was here for wound review today. The area on the right foot is slightly smaller in terms of surface area. However the surface of the wound is very gritty and hyper granulated.  More problematically the area on the plantar left foot has increased depth indeed in the center of this it probes to bone. We have been using Hydrofera Blue in both areas. She is on antibiotics as prescribed by infectious disease IV vancomycin and oral Flagyl 3/25; the patient is in for total contact cast change. Our intake nurse was concerned about the amount of drainage which soaked right to the multiple layers we are putting on this. Wondered about options. The wound bed actually looks as healthy as this is looked although there may not be a lot of change in dimensions things are looking a lot better. If we are going to heal this woman's wound which is in the middle of her right Charcot foot this is going to need to be offloaded. There are not many alternatives 3/29; still not a lot of improvement although the surface of the right wound looks better. We have been using Hydrofera Blue 04/17/2019 upon evaluation today patient appears to be doing well with a total contact cast. With actually having to change this twice a week. She saw Dr. Dellia Nims for wound care earlier in the week this is for the cast change today. That is due to the fact that she has significant drainage. Overall the wound is been looking excellent however. 4/5; we continue to put a total contact cast on this patient with Hydrofera Blue. Still a lot of drainage which precludes a simple weekly change or changing this twice a week. 4/8; total contact cast changed today. Apparently the wound looks better per our intake nurse 4/12; patient here for wound care evaluation. Wounds on the right foot have not changed in dimension none about a month left foot looks similar. Apparently her antibiotics that we are giving her for osteomyelitis in the left foot complete on Wednesday. We have been using Hydrofera Blue on the right foot under a total contact cast. Nurses report greenish drainage although I think this may be discoloration from the  [facility] 4/15; back for cast change wounds look quite good although still moderate amount of drainage. T contact cast reapplied otal 4/19;. Wound evaluation. Wound on the right foot is smaller surface looks healthy. There is still the divot in the middle part of this wound but I could not feel any bone. Area on the left foot also looks better She  has completed her antibiotics but still has the PICC line in. 4/23; patient comes back for a total contact cast change this was done in the standard fashion no issues 4/26; wound measures slightly larger on the right foot which is disappointing. She sees Dr. Megan Salon with regards to her antibiotics on Wednesday. I believe she is on vancomycin IV and oral Flagyl. 4/29; in for her routine total contact cast change. She saw Dr. Megan Salon this morning. He is asking about antibiotic continuation I will be in touch with him. I did not look at her wound today 5/3; patient saw Dr. Megan Salon on 4/28. I did send him a note asking about the continuation of perhaps an oral regimen. I have not heard back. She still has an elevated sedimentation rate at 81. Her wounds are smaller. 5/6; in for her total contact cast change. We did not look at the wound today. She has seen Dr. Megan Salon and is now on oral Keflex and Flagyl. She seems to be tolerating these well 5/10; she had her PICC line removed. The wound on the right foot after some initial improvement has not made any improvement even with the total contact cast. Still having a lot of drainage. Likewise the area on the left foot really is not any better either 5/14; in for a cast change today. We will look at the right foot on Monday. If this is not making any progress I am going to try to cast the left foot. 5/17; the wound on the right foot is absolutely no different. I am going to put ointment on this area and change the cast of the left foot. We have been using silver alginate there 5/21; apparently the area  on the right foot was better per our intake nurse although I did not see it. We did a standard total contact cast change on the left foot we have been applying silver alginate to the wound here. 06/10/19-Area on the right foot per dimensions slightly better, the left foot has still some depth applying silver alginate to the wound with TCC 06/13/19-Patient here for Casting to Left foot 6/1; patient had the total contact cast were placed on the left foot. Noted some swelling in her right leg although the dimensions of the wound on the right foot are better. We have been using PolyMem on the right and silver alginate on the left under the cast 6/7; we continue to make nice progress in both her plantar feet wound. We are putting the total contact cast on the left. Surprisingly in spite of this the area on the right medial foot in the middle of her Charcot deformities actually is making progress as well 07/01/2019 upon evaluation today patient actually appears to be doing quite well with regard to her wounds on the feet compared to when I saw her last that has been quite some time. With that being said I do believe the total contact cast has been beneficial in this left foot and she has a lot of signs of improvement she does have some callus overhanging which is caused a little bit of a ledge and an issue here. I do believe that we may be able to do something to help in that regard. Fortunately otherwise the wound seems to be progressing quite nicely. 6/21; the patient's area on the left foot that we are currently casting is just about closed. The area on the right medial foot has a healthy looking surface but may be not changed  as much from last week in terms of surface area. We are using silver alginate on the right and polymen Ag under the cast 6/28; only a small open area on the left foot remains. Using silver alginate under a total contact cast. She has a diabetic shoe I have asked her to bring that for  next week where this may be closed. The area on the right foot with a Charcot deformity is also measuring slightly smaller. We are using PolyMem Ag here 7/6; left foot is totally closed. She does not require a total contact cast. She will graduate to her own diabetic shoe. ooThe area on the right foot with a Charcot deformity measuring about the same. We have been using polymen under compression wraps. She did not do well in this area with a total contact cast 7/13; left foot remains intact. She is skillfully covering this area with foam and using her diabetic shoe. Slight area improvement of the right foot wound. Still rolled senescent looking edges. We have been using polymen 7/19; left foot remains intact. Wound measuring slightly smaller on the right foot in the middle of the Charcot deformity. We have been using PolyMem Ag 7/26; per the patient left foot remains intact. The wound on the right foot looks slightly smaller but dimensions are measuring the same. We have been using polymen Ag 8/9-Patient back after 2 weeks, wound of the right foot again looking slightly smaller, continuing to use PolyMem silver 8/16; patient has made some minor improvements in wound area on the right foot. Using polymen Ag. The left foot remains healed 8/23; continued slight improvement in dimensions. Rolled edges around the wound edge noted. We have been using polymen Ag. Patient offloading with a surgical shoe and crutches 8/30; no change in surface area. Thick rolled skin and subcutaneous tissue around the edges debris on the wound surface. We have been using polymen Ag initially with some improvement however stalled on the last 2 weeks in terms of surface area 9/13; surface area is smaller still using polymen Ag. Thick rolled skin and subcutaneous tissue around the edges requiring debridement. 9/20; again surface area is improved using polymen Ag however each week she develops thick rolled skin and subcutaneous  tissue around the wound edges requiring debridement. She is offloading this using crutches 9/27; we've been using PolyMem AG. Wound does not look any different from last week. Raised edges of callus skin and subcutaneous tissue around the wound bed. 10/5; we been using polymen Ag. No ability to put a total contact cast on today in the clinic. Wound is about the same still rolled edges of thick callus around the margins 10/18; we put her in a total contact cast last week things did not go well. Not only does the wound looked worse macerated angry with thick callus and some warmth around it. She had another wound above this and one on the anterior right leg. I am not totally certain why this happened. She has not been systemically unwell 10/25; culture from last week grew Pseudomonas and MRSA. My experience with this MRSA is usually a true infection and Pseudomonas a colonizer I am going to put her on doxycycline today we put gentamicin on the wound surface. Continuing with silver alginate 11/1; she has tolerated her antibiotics. I gave her doxycycline. Was still using gentamicin. The original wound looks about the same thick rolled edges. The cast injury just above it is superficial 11/8; even after an aggressive debridement last week the  wound on her medial Charcot foot on the right looks about the same. The surface looks satisfactory however rolled edges of callus thick skin and subcutaneous tissue. She did not do well on 2 separate occasions in a total contact cast. The cast abrasion she has on the medial foot is just above closed. We have been using polymen Ag. She is attempting to offload this is much as she can with her crutches 11/15; the wound on the Charcot right foot about the same in terms of surface area.. The area above this on the ankle is healed. 11/29; Charcot right foot wound. This is medially. Wound may be slightly smaller however still raised edges of callus and skin around the  wound circumference. We have been using polymen 12/6; Charcot right foot wound. This is medially on the plantar aspect. I did not debride this today wound looks about the same in a week she is reappeared with raised edges of callus and thick skin around the wound. We have been using polymen no real improvement 12/13; again an extensive debridement today. Thick callus skin and subcutaneous tissue around the wound margin with undermining. We have been using silver collagen since last week 12/20; in spite of the debridement last week the wound really looks no difference. Circumferential callus shallow wound granulation does not look too bad. We have been using silver collagen really no improvement 1/10 patient comes in with a wound looking about the same. Circumferential callus and subcutaneous tissue. I did not debride this today we have been using Sorbact and wrapping her leg. Even putting her in a total contact cast did not help here. She has an appointment with [name] on 2/9. Question of whether a surgical procedure to correct the deformity underneath this area might help. 02/05/2020 upon evaluation today patient actually appears to be stalling to some degree here. Fortunately there is no signs of active infection at this time. She does have a significant callus buildup and therefore before rewrapping the staff wanted me to see if I could see her and remove some of the callus. I was definitely happy to do that today. 1/24; the patient arrives today with a cleaner looking her wound circumference but with the same rolled edges. Change to silver alginate last time because of drainage. Wound surface looks somewhat better although it precariously close to the underlying bone here. 1/31; she arrives with a continued improvement in the wound circumference less callus less thick skin. I changed her back to silver collagen today as the surface appears to be improved and the granulation looks stable. She  sees [name] on 2/9 2/7; patient sees [name] on 2/9. Arrives with a considerable amount of thick callus around this wound the area is essentially unchanged she has been using silver collagen Objective Constitutional Vitals Time Taken: 1:37 PM, Height: 68 in, Weight: 265 lbs, BMI: 40.3, Temperature: 98.2 F, Pulse: 76 bpm, Respiratory Rate: 20 breaths/min, Blood Pressure: 122/64 mmHg, Capillary Blood Glucose: 130 mg/dl. Cardiovascular Pedal pulses are palpable. General Notes: Wound exam; really the surface of the wound looks unchanged. Thick circumferential callus and skin around the margins is back exactly as we are used to seeing this. Although I debrided this many times its never made any difference in getting the area to epithelialize. Underneath this I think she has an subluxed bone edgeo Cuboid Integumentary (Hair, Skin) Wound #9 status is Open. Original cause of wound was Blister. The wound is located on the Right,Medial Foot. The wound  measures 1.5cm length x 1.4cm width x 0.7cm depth; 1.649cm^2 area and 1.155cm^3 volume. There is Fat Layer (Subcutaneous Tissue) exposed. There is no tunneling or undermining noted. There is a medium amount of serosanguineous drainage noted. The wound margin is thickened. There is large (67-100%) pale granulation within the wound bed. There is no necrotic tissue within the wound bed. Assessment Active Problems ICD-10 Type 2 diabetes mellitus with foot ulcer Non-pressure chronic ulcer of other part of right foot limited to breakdown of skin Type 2 diabetes mellitus with diabetic polyneuropathy Charcot's joint, right ankle and foot Plan Follow-up Appointments: Return Appointment in 1 week. Bathing/ Shower/ Hygiene: May shower with protection but do not get wound dressing(s) wet. Edema Control - Lymphedema / SCD / Other: Elevate legs to the level of the heart or above for 30 minutes daily and/or when sitting, a frequency of: - throughout the  day Avoid standing for long periods of time. Patient to wear own compression stockings every day. - left leg daily Moisturize legs daily. - left leg daily WOUND #9: - Foot Wound Laterality: Right, Medial Cleanser: Soap and Water Every Other Day/7 Days Discharge Instructions: May shower and wash wound with dial antibacterial soap and water prior to dressing change. Cleanser: [place] Ancillary Kit - 15 Day Supply (DME) (Generic) Every Other Day/7 Days Discharge Instructions: Use supplies as instructed; Kit contains: (15) Saline Bullets; (15) 3x3 Gauze; 15 pr Gloves Peri-Wound Care: Sween Lotion (Moisturizing lotion) Every Other Day/7 Days Discharge Instructions: Apply moisturizing lotion as directed Prim Dressing: Promogran Prisma Matrix, 4.34 (sq in) (silver collagen) (DME) (Generic) Every Other Day/7 Days ary Discharge Instructions: Moisten collagen with saline or hydrogel Secondary Dressing: Woven Gauze Sponge, Non-Sterile 4x4 in (DME) (Generic) Every Other Day/7 Days Discharge Instructions: Apply over primary dressing as directed. Secondary Dressing: ABD Pad, 5x9 (DME) Every Other Day/7 Days Discharge Instructions: Apply over primary dressing as directed. Secondary Dressing: Felt 2.5 yds x 5.5 in Every Other Day/7 Days Discharge Instructions: Apply over primary dressing cut to form callous pad around wound Secured With: Kerlix Roll Sterile, 4.5x3.1 (in/yd) (DME) (Generic) Every Other Day/7 Days Discharge Instructions: Secure with Kerlix as directed. Secured With: Paper T ape, 2x10 (in/yd) (DME) (Generic) Every Other Day/7 Days Discharge Instructions: Secure dressing with tape as directed. 1. I have not been able to get this wound to progress although I was able to get the area on the left plantar foot the heel. This 2. This did not respond to total contact casting on 2 different occasions 3. Continued callus buildup. She uses crutches but I do not think she is able to fully offload  this. 4. Sees [name] tomorrow whether he feels there is an option for surgical intervention in this area otherwise I do not think this is going to heal Electronic Signature(s) Signed: 02/25/2020 8:19:11 AM By: Linton Ham MD Entered By: Linton Ham on 02/24/2020 14:38:28 -------------------------------------------------------------------------------- SuperBill Details Patient Name: Date of Service: Yolanda Manges, Princess J. 02/24/2020 Medical Record Number: [id number] Patient Account Number: [id number] Date of Birth/Sex: Treating RN: May 15, 1955 (65 y.o. 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

## 2020-02-25 NOTE — Progress Notes (Signed)
[name], [name] ([id number]) Visit Report for 02/24/2020 Arrival Information Details Patient Name: Date of Service: GILA, LAUF 02/24/2020 1:30 PM Medical Record Number: [id number] Patient Account Number: [id number] Date of Birth/Sex: Treating RN: December 22, 1955 (65 y.o. [name] Primary Care Javares Kaufhold: MA SO UDI, [place] MA LSA DA T Other Clinician: Referring Osama Coleson: Treating Iyana Topor/Extender: Linton Ham MA SO UDI, ELHA MA LSA DA T Weeks in Treatment: 10 Visit Information History Since Last Visit Added or deleted any medications: No Patient Arrived: Crutches Any new allergies or adverse reactions: No Arrival Time: 13:36 Had a fall or experienced change in No Accompanied By: self activities of daily living that may affect Transfer Assistance: None risk of falls: Patient Identification Verified: Yes Signs or symptoms of abuse/neglect since last visito No Secondary Verification Process Completed: Yes Hospitalized since last visit: No Patient Requires Transmission-Based Precautions: No Implantable device outside of the clinic excluding No Patient Has Alerts: No cellular tissue based products placed in the center since last visit: Has Dressing in Place as Prescribed: Yes Pain Present Now: No Electronic Signature(s) Signed: 02/24/2020 2:40:44 PM By: Sandre Kitty Entered By: Sandre Kitty on 02/24/2020 13:37:38 -------------------------------------------------------------------------------- Clinic Level of Care Assessment Details Patient Name: Date of Service: ENO [facility]. 02/24/2020 1:30 PM Medical Record Number: [id number] Patient Account Number: [id number] Date of Birth/Sex: Treating RN: 11/19/1955 (65 y.o. [name] Primary Care Nia Nathaniel: MA SO UDI, ELHA MA LSA DA T Other Clinician: Referring Randell Teare: Treating Buford Bremer/Extender: Linton Ham MA SO UDI, ELHA MA LSA DA T Weeks in Treatment: 44 Clinic Level of Care Assessment Items TOOL  4 Quantity Score X- 1 0 Use when only an EandM is performed on FOLLOW-UP visit ASSESSMENTS - Nursing Assessment / Reassessment X- 1 10 Reassessment of Co-morbidities (includes updates in patient status) X- 1 5 Reassessment of Adherence to Treatment Plan ASSESSMENTS - Wound and Skin A ssessment / Reassessment X - Simple Wound Assessment / Reassessment - one wound 1 5 _0  - 0 Complex Wound Assessment / Reassessment - multiple wounds _1  - 0 Dermatologic / Skin Assessment (not related to wound area) ASSESSMENTS - Focused Assessment _2  - 0 Circumferential Edema Measurements - multi extremities _3  - 0 Nutritional Assessment / Counseling / Intervention X- 1 5 Lower Extremity Assessment (monofilament, tuning fork, pulses) _4  - 0 Peripheral Arterial Disease Assessment (using hand held doppler) ASSESSMENTS - Ostomy and/or Continence Assessment and Care _5  - 0 Incontinence Assessment and Management _6  - 0 Ostomy Care Assessment and Management (repouching, etc.) PROCESS - Coordination of Care X - Simple Patient / Family Education for ongoing care 1 15 _7  - 0 Complex (extensive) Patient / Family Education for ongoing care X- 1 10 Staff obtains Programmer, systems, Records, T Results / Process Orders est _8  - 0 Staff telephones HHA, Nursing Homes / Clarify orders / etc _9  - 0 Routine Transfer to another Facility (non-emergent condition) _10  - 0 Routine Hospital Admission (non-emergent condition) _11  - 0 New Admissions / Biomedical engineer / Ordering NPWT Apligraf, etc. , _12  - 0 Emergency Hospital Admission (emergent condition) X- 1 10 Simple Discharge Coordination _13  - 0 Complex (extensive) Discharge Coordination PROCESS - Special Needs _14  - 0 Pediatric / Minor Patient Management _15  - 0 Isolation Patient Management _16  - 0 Hearing / Language / Visual special needs _17  - 0 Assessment of Community assistance (transportation, D/C planning, etc.) _18  - 0 Additional assistance / Altered  mentation _19  - 0 Support Surface(s) Assessment (bed, cushion, seat, etc.) INTERVENTIONS - Wound  Cleansing / Measurement X - Simple Wound Cleansing - one wound 1 5 _0  - 0 Complex Wound Cleansing - multiple wounds X- 1 5 Wound Imaging (photographs - any number of wounds) _1  - 0 Wound Tracing (instead of photographs) X- 1 5 Simple Wound Measurement - one wound _2  - 0 Complex Wound Measurement - multiple wounds INTERVENTIONS - Wound Dressings _3  - 0 Small Wound Dressing one or multiple wounds X- 1 15 Medium Wound Dressing one or multiple wounds _4  - 0 Large Wound Dressing one or multiple wounds X- 1 5 Application of Medications - topical <[id number]>_7<\/[id number]>_2  - 0 Application of Medications - injection INTERVENTIONS - Miscellaneous _6  - 0 External ear exam _7  - 0 Specimen Collection (cultures, biopsies, blood, body fluids, etc.) _8  - 0 Specimen(s) / Culture(s) sent or taken to Lab for analysis _9  - 0 Patient Transfer (multiple staff / Civil Service fast streamer / Similar devices) _10  - 0 Simple Staple / Suture removal (25 or less) _11  - 0 Complex Staple / Suture removal (26 or more) _12  - 0 Hypo / Hyperglycemic Management (close monitor of Blood Glucose) _13  - 0 Ankle / Brachial Index (ABI) - do not check if billed separately X- 1 5 Vital Signs Has the patient been seen at the hospital within the last three years: Yes Total Score: 100 Level Of Care: New/Established - Level 3 Electronic Signature(s) Signed: 02/24/2020 4:14:28 PM By: Levan Hurst RN, BSN Entered By: Levan Hurst on 02/24/2020 14:16:44 -------------------------------------------------------------------------------- Encounter Discharge Information Details Patient Name: Date of Service: [name], [name] 02/24/2020 1:30 PM Medical Record Number: [id number] Patient Account Number: [id number] Date of Birth/Sex: Treating RN: [birth year] (65 y.o. Benjaman Lobe Primary Care Adael Culbreath: MA SO UDI, Willette Pa MA LSA DA T Other Clinician: Referring  Hawken Bielby: Treating Dale Strausser/Extender: Linton Ham MA SO UDI, ELHA MA LSA DA T Weeks in Treatment: 24 Encounter Discharge Information Items Discharge Condition: Stable Ambulatory Status: Ambulatory Discharge Destination: Home Transportation: Private Auto Accompanied By: self Schedule Follow-up Appointment: Yes Clinical Summary of Care: Patient Declined Electronic Signature(s) Signed: 02/25/2020 5:05:22 PM By: Rhae Hammock RN Entered By: Rhae Hammock on 02/24/2020 14:14:47 -------------------------------------------------------------------------------- Lower Extremity Assessment Details Patient Name: Date of Service: [name], [name] 02/24/2020 1:30 PM Medical Record Number: [id number] Patient Account Number: [id number] Date of Birth/Sex: Treating RN: [birth year] (65 y.o. [name] Primary Care Chelsia Serres: MA SO UDI, [place] MA LSA DA T Other Clinician: Referring Dalicia Kisner: Treating Tina Temme/Extender: Linton Ham MA SO UDI, ELHA MA LSA DA T Weeks in Treatment: 56 Edema Assessment Assessed: [Left: No] [Right: No] Edema: [Left: N] [Right: o] Calf Left: Right: Point of Measurement: 43 cm From Medial Instep 32 cm Ankle Left: Right: Point of Measurement: 12 cm From Medial Instep 21 cm Vascular Assessment Pulses: Dorsalis Pedis Palpable: [Right:Yes] Electronic Signature(s) Signed: 02/24/2020 4:14:28 PM By: Levan Hurst RN, BSN Entered By: Levan Hurst on 02/24/2020 13:52:52 -------------------------------------------------------------------------------- Multi Wound Chart Details Patient Name: Date of Service: [name], [name] 02/24/2020 1:30 PM Medical Record Number: [id number] Patient Account Number: [id number] Date of Birth/Sex: Treating RN: 02/18/1955 (65 y.o. [name] Primary Care Jakeim Sedore: MA SO UDI, [place] MA LSA DA T Other Clinician: Referring Jezebelle Ledwell: Treating Anabeth Chilcott/Extender: Linton Ham MA SO UDI, ELHA MA LSA DA T Weeks in  Treatment: 80 Vital Signs Height(in): 68 Capillary Blood Glucose(mg/dl): 130 Weight(lbs): 265 Pulse(bpm): 76 Body Mass Index(BMI): 40 Blood Pressure(mmHg): 122/64 Temperature(F): 98.2 Respiratory Rate(breaths/min): 20 Photos: [9:No Photos Right, Medial Foot] [N/A:N/A N/A] Wound Location: [9:Blister] [N/A:N/A] Wounding  Event: [9:Diabetic Wound/Ulcer of the Lower] [N/A:N/A] Primary Etiology: [9:Extremity Glaucoma, Chronic sinus] [N/A:N/A] Comorbid History: [9:problems/congestion, Anemia, Sleep Apnea, Hypertension, Type II Diabetes, Rheumatoid Arthritis, Neuropathy 06/18/2018] [N/A:N/A] Date Acquired: [9:56] [N/A:N/A] Weeks of Treatment: [9:Open] [N/A:N/A] Wound Status: [9:1.5x1.4x0.7] [N/A:N/A] Measurements L x W x D (cm) [9:1.649] [N/A:N/A] A (cm) : rea [9:1.155] [N/A:N/A] Volume (cm) : [9:85.00%] [N/A:N/A] % Reduction in A rea: [9:88.30%] [N/A:N/A] % Reduction in Volume: [9:Grade 2] [N/A:N/A] Classification: [9:Medium] [N/A:N/A] Exudate A mount: [9:Serosanguineous] [N/A:N/A] Exudate Type: [9:red, brown] [N/A:N/A] Exudate Color: [9:Thickened] [N/A:N/A] Wound Margin: [9:Large (67-100%)] [N/A:N/A] Granulation A mount: [9:Pale] [N/A:N/A] Granulation Quality: [9:None Present (0%)] [N/A:N/A] Necrotic A mount: [9:Fat Layer (Subcutaneous Tissue): Yes N/A] Exposed Structures: [9:Fascia: No Tendon: No Muscle: No Joint: No Bone: No Small (1-33%)] [N/A:N/A] Treatment Notes Wound #9 (Foot) Wound Laterality: Right, Medial Cleanser Soap and Water Discharge Instruction: May shower and wash wound with dial antibacterial soap and water prior to dressing change. Byram Ancillary Kit - 15 Day Supply Discharge Instruction: Use supplies as instructed; Kit contains: (15) Saline Bullets; (15) 3x3 Gauze; 15 pr Gloves Peri-Wound Care Sween Lotion (Moisturizing lotion) Discharge Instruction: Apply moisturizing lotion as directed Topical Primary Dressing Promogran Prisma Matrix, 4.34 (sq in)  (silver collagen) Discharge Instruction: Moisten collagen with saline or hydrogel Secondary Dressing Woven Gauze Sponge, Non-Sterile 4x4 in Discharge Instruction: Apply over primary dressing as directed. ABD Pad, 5x9 Discharge Instruction: Apply over primary dressing as directed. Felt 2.5 yds x 5.5 in Discharge Instruction: Apply over primary dressing cut to form callous pad around wound Secured With Kerlix Roll Sterile, 4.5x3.1 (in/yd) Discharge Instruction: Secure with Kerlix as directed. Paper Tape, 2x10 (in/yd) Discharge Instruction: Secure dressing with tape as directed. Compression Wrap Compression Stockings Add-Ons Electronic Signature(s) Signed: 02/24/2020 4:14:28 PM By: Levan Hurst RN, BSN Signed: 02/25/2020 8:19:11 AM By: Linton Ham MD Entered By: Linton Ham on 02/24/2020 14:33:58 -------------------------------------------------------------------------------- Multi-Disciplinary Care Plan Details Patient Name: Date of Service: [facility], [name] 02/24/2020 1:30 PM Medical Record Number: [id number] Patient Account Number: [id number] Date of Birth/Sex: Treating RN: 1955/12/18 (65 y.o. [name] Primary Care Ritamarie Arkin: MA SO UDI, Willette Pa MA LSA DA T Other Clinician: Referring Nyheim Seufert: Treating Chael Urenda/Extender: Linton Ham MA SO UDI, ELHA MA LSA DA T Weeks in Treatment: 8 Active Inactive Wound/Skin Impairment Nursing Diagnoses: Impaired tissue integrity Knowledge deficit related to ulceration/compromised skin integrity Goals: Patient/caregiver will verbalize understanding of skin care regimen Date Initiated: 01/28/2019 Target Resolution Date: 03/27/2020 Goal Status: Active Interventions: Assess patient/caregiver ability to obtain necessary supplies Assess patient/caregiver ability to perform ulcer/skin care regimen upon admission and as needed Assess ulceration(s) every visit Provide education on ulcer and skin care Notes: Electronic  Signature(s) Signed: 02/24/2020 4:14:28 PM By: Levan Hurst RN, BSN Entered By: Levan Hurst on 02/24/2020 14:16:03 -------------------------------------------------------------------------------- Pain Assessment Details Patient Name: Date of Service: ENO Elige Ko, [name] 02/24/2020 1:30 PM Medical Record Number: [id number] Patient Account Number: [id number] Date of Birth/Sex: Treating RN: 07/08/1955 (65 y.o. [name] Primary Care Serayah Yazdani: MA SO UDI, [place] MA LSA DA T Other Clinician: Referring Lexi Conaty: Treating Jaziah Kwasnik/Extender: Linton Ham MA SO UDI, ELHA MA LSA DA T Weeks in Treatment: 39 Active Problems Location of Pain Severity and Description of Pain Patient Has Paino No Site Locations Pain Management and Medication Current Pain Management: Electronic Signature(s) Signed: 02/24/2020 2:40:44 PM By: Sandre Kitty Signed: 02/24/2020 4:14:28 PM By: Levan Hurst RN, BSN Entered By: Sandre Kitty on 02/24/2020 13:38:02 -------------------------------------------------------------------------------- Patient/Caregiver Education Details Patient Name: Date of Service:  [name], [name] 2/7/2022andnbsp1:30 PM Medical Record Number: [id number] Patient Account Number: [id number] Date of Birth/Gender: Treating RN: [birth year] (65 y.o. [name] Primary Care Physician: MA SO UDI, Willette Pa MA LSA DA T Other Clinician: Referring Physician: Treating Physician/Extender: Linton Ham MA SO UDI, ELHA MA LSA DA T Weeks in Treatment: 36 Education Assessment Education Provided To: Patient Education Topics Provided Wound/Skin Impairment: Methods: Explain/Verbal Responses: State content correctly [employer]) Signed: 02/24/2020 4:14:28 PM By: Levan Hurst RN, BSN Entered By: Levan Hurst on 02/24/2020 14:16:17 -------------------------------------------------------------------------------- Wound Assessment Details Patient Name: Date of  Service: [name], [name] 02/24/2020 1:30 PM Medical Record Number: [id number] Patient Account Number: [id number] Date of Birth/Sex: Treating RN: 10-23-1955 (65 y.o. [name] Primary Care Shaft Corigliano: MA SO UDI, [place] MA LSA DA T Other Clinician: Referring [name] [name]: Treating Rodrigus Kilker/Extender: Linton Ham MA SO UDI, ELHA MA LSA DA T Weeks in Treatment: 39 Wound Status Wound Number: 9 Primary Diabetic Wound/Ulcer of the Lower Extremity Etiology: Wound Location: Right, Medial Foot Wound Open Wounding Event: Blister Status: Date Acquired: 06/18/2018 Comorbid Glaucoma, Chronic sinus problems/congestion, Anemia, Sleep Weeks Of Treatment: 56 History: Apnea, Hypertension, Type II Diabetes, Rheumatoid Arthritis, Clustered Wound: No Neuropathy Photos Photo Uploaded By: Mikeal Hawthorne on 02/25/2020 14:13:46 Wound Measurements Length: (cm) 1.5 Width: (cm) 1.4 Depth: (cm) 0.7 Area: (cm) 1.649 Volume: (cm) 1.155 % Reduction in Area: 85% % Reduction in Volume: 88.3% Epithelialization: Small (1-33%) Tunneling: No Undermining: No Wound Description Classification: Grade 2 Wound Margin: Thickened Exudate Amount: Medium Exudate Type: Serosanguineous Exudate Color: red, brown Foul Odor After Cleansing: No Slough/Fibrino No Wound Bed Granulation Amount: Large (67-100%) Exposed Structure Granulation Quality: Pale Fascia Exposed: No Necrotic Amount: None Present (0%) Fat Layer (Subcutaneous Tissue) Exposed: Yes Tendon Exposed: No Muscle Exposed: No Joint Exposed: No Bone Exposed: No Treatment Notes Wound #9 (Foot) Wound Laterality: Right, Medial Cleanser Soap and Water Discharge Instruction: May shower and wash wound with dial antibacterial soap and water prior to dressing change. Byram Ancillary Kit - 15 Day Supply Discharge Instruction: Use supplies as instructed; Kit contains: (15) Saline Bullets; (15) 3x3 Gauze; 15 pr Gloves Peri-Wound Care Sween Lotion  (Moisturizing lotion) Discharge Instruction: Apply moisturizing lotion as directed Topical Primary Dressing Promogran Prisma Matrix, 4.34 (sq in) (silver collagen) Discharge Instruction: Moisten collagen with saline or hydrogel Secondary Dressing Woven Gauze Sponge, Non-Sterile 4x4 in Discharge Instruction: Apply over primary dressing as directed. ABD Pad, 5x9 Discharge Instruction: Apply over primary dressing as directed. Felt 2.5 yds x 5.5 in Discharge Instruction: Apply over primary dressing cut to form callous pad around wound Secured With Kerlix Roll Sterile, 4.5x3.1 (in/yd) Discharge Instruction: Secure with Kerlix as directed. Paper Tape, 2x10 (in/yd) Discharge Instruction: Secure dressing with tape as directed. Compression Wrap Compression Stockings Add-Ons Electronic Signature(s) Signed: 02/24/2020 4:14:28 PM By: Levan Hurst RN, BSN Entered By: Levan Hurst on 02/24/2020 13:53:12 -------------------------------------------------------------------------------- Vitals Details Patient Name: Date of Service: [name], [name] 02/24/2020 1:30 PM Medical Record Number: [id number] Patient Account Number: [id number] Date of Birth/Sex: Treating RN: [birth year] (65 y.o. [name] Primary Care Autry Droege: MA SO UDI, [place] MA LSA DA T Other Clinician: Referring Brody Bonneau: Treating Journie Howson/Extender: Linton Ham MA SO UDI, ELHA MA LSA DA T Weeks in Treatment: 66 Vital Signs Time Taken: 13:37 Temperature (F): 98.2 Height (in): 68 Pulse (bpm): 76 Weight (lbs): 265 Respiratory Rate (breaths/min): 20 Body Mass Index (BMI): 40.3 Blood Pressure (mmHg): 122/64 Capillary Blood Glucose (mg/dl): 130 Reference Range: 80 - 120  mg / dl Electronic Signature(s) Signed: 02/24/2020 2:40:44 PM By: Sandre Kitty Entered By: Sandre Kitty on 02/24/2020 13:37:57

## 2020-02-26 ENCOUNTER — Other Ambulatory Visit: Payer: Self-pay | Admitting: Orthopedic Surgery

## 2020-02-26 DIAGNOSIS — M14671 Charcot's joint, right ankle and foot: Secondary | ICD-10-CM

## 2020-03-01 ENCOUNTER — Other Ambulatory Visit: Payer: Self-pay | Admitting: Internal Medicine

## 2020-03-01 DIAGNOSIS — K219 Gastro-esophageal reflux disease without esophagitis: Secondary | ICD-10-CM

## 2020-03-02 ENCOUNTER — Other Ambulatory Visit: Payer: Self-pay

## 2020-03-02 ENCOUNTER — Encounter (HOSPITAL_BASED_OUTPATIENT_CLINIC_OR_DEPARTMENT_OTHER): Payer: Medicare Other | Admitting: Internal Medicine

## 2020-03-02 DIAGNOSIS — E11621 Type 2 diabetes mellitus with foot ulcer: Secondary | ICD-10-CM | POA: Diagnosis not present

## 2020-03-02 NOTE — Progress Notes (Signed)
MAHALEY, SCHWERING ([id number]) Visit Report for 03/02/2020 Arrival Information Details Patient Name: Date of Service: [name], [name] 03/02/2020 1:30 PM Medical Record Number: [id number] Patient Account Number: [id number] Date of Birth/Sex: Treating RN: Dec 23, 1955 (65 y.o. [name] Primary Care Arin Vanosdol: MA SO UDI, [place] MA LSA DA T Other Clinician: Referring Seila Liston: Treating Colyn Miron/Extender: Linton Ham MA SO UDI, ELHA MA LSA DA T Weeks in Treatment: 39 Visit Information History Since Last Visit Added or deleted any medications: No Patient Arrived: Crutches Any new allergies or adverse reactions: No Arrival Time: 13:48 Had a fall or experienced change in No Accompanied By: self activities of daily living that may affect Transfer Assistance: None risk of falls: Patient Identification Verified: Yes Signs or symptoms of abuse/neglect since last visito No Secondary Verification Process Completed: Yes Hospitalized since last visit: No Patient Requires Transmission-Based Precautions: No Implantable device outside of the clinic excluding No Patient Has Alerts: No cellular tissue based products placed in the center since last visit: Has Dressing in Place as Prescribed: Yes Pain Present Now: No Electronic Signature(s) Signed: 03/02/2020 2:29:08 PM By: Sandre Kitty Entered By: Sandre Kitty on 03/02/2020 13:49:09 -------------------------------------------------------------------------------- Clinic Level of Care Assessment Details Patient Name: Date of Service: Sarah [facility]. 03/02/2020 1:30 PM Medical Record Number: [id number] Patient Account Number: [id number] Date of Birth/Sex: Treating RN: [birth year] (65 y.o. [name] Primary Care Nicholous Girgenti: MA SO UDI, ELHA MA LSA DA T Other Clinician: Referring Tien Spooner: Treating Tobie Hellen/Extender: Linton Ham MA SO UDI, ELHA MA LSA DA T Weeks in Treatment: 47 Clinic Level of Care Assessment  Items TOOL 4 Quantity Score X- 1 0 Use when only an EandM is performed on FOLLOW-UP visit ASSESSMENTS - Nursing Assessment / Reassessment X- 1 10 Reassessment of Co-morbidities (includes updates in patient status) X- 1 5 Reassessment of Adherence to Treatment Plan ASSESSMENTS - Wound and Skin A ssessment / Reassessment X - Simple Wound Assessment / Reassessment - one wound 1 5 [] - 0 Complex Wound Assessment / Reassessment - multiple wounds [] - 0 Dermatologic / Skin Assessment (not related to wound area) ASSESSMENTS - Focused Assessment [] - 0 Circumferential Edema Measurements - multi extremities [] - 0 Nutritional Assessment / Counseling / Intervention X- 1 5 Lower Extremity Assessment (monofilament, tuning fork, pulses) [] - 0 Peripheral Arterial Disease Assessment (using hand held doppler) ASSESSMENTS - Ostomy and/or Continence Assessment and Care [] - 0 Incontinence Assessment and Management [] - 0 Ostomy Care Assessment and Management (repouching, etc.) PROCESS - Coordination of Care X - Simple Patient / Family Education for ongoing care 1 15 [] - 0 Complex (extensive) Patient / Family Education for ongoing care X- 1 10 Staff obtains Programmer, systems, Records, T Results / Process Orders est [] - 0 Staff telephones HHA, Nursing Homes / Clarify orders / etc [] - 0 Routine Transfer to another Facility (non-emergent condition) [] - 0 Routine Hospital Admission (non-emergent condition) [] - 0 New Admissions / Biomedical engineer / Ordering NPWT Apligraf, etc. , [] - 0 Emergency Hospital Admission (emergent condition) X- 1 10 Simple Discharge Coordination [] - 0 Complex (extensive) Discharge Coordination PROCESS - Special Needs [] - 0 Pediatric / Minor Patient Management [] - 0 Isolation Patient Management [] - 0 Hearing / Language / Visual special needs [] - 0 Assessment of Community assistance (transportation, D/C planning, etc.) [] - 0 Additional  assistance / Altered mentation [] - 0 Support Surface(s) Assessment (bed, cushion, seat, etc.) INTERVENTIONS - Wound  Cleansing / Measurement X - Simple Wound Cleansing - one wound 1 5 [] - 0 Complex Wound Cleansing - multiple wounds X- 1 5 Wound Imaging (photographs - any number of wounds) [] - 0 Wound Tracing (instead of photographs) X- 1 5 Simple Wound Measurement - one wound [] - 0 Complex Wound Measurement - multiple wounds INTERVENTIONS - Wound Dressings [] - 0 Small Wound Dressing one or multiple wounds X- 1 15 Medium Wound Dressing one or multiple wounds [] - 0 Large Wound Dressing one or multiple wounds X- 1 5 Application of Medications - topical [] - 0 Application of Medications - injection INTERVENTIONS - Miscellaneous [] - 0 External ear exam [] - 0 Specimen Collection (cultures, biopsies, blood, body fluids, etc.) [] - 0 Specimen(s) / Culture(s) sent or taken to Lab for analysis [] - 0 Patient Transfer (multiple staff / Civil Service fast streamer / Similar devices) [] - 0 Simple Staple / Suture removal (25 or less) [] - 0 Complex Staple / Suture removal (26 or more) [] - 0 Hypo / Hyperglycemic Management (close monitor of Blood Glucose) [] - 0 Ankle / Brachial Index (ABI) - do not check if billed separately X- 1 5 Vital Signs Has the patient been seen at the hospital within the last three years: Yes Total Score: 100 Level Of Care: New/Established - Level 3 Electronic Signature(s) Signed: 03/02/2020 5:50:50 PM By: Levan Hurst RN, BSN Entered By: Levan Hurst on 03/02/2020 15:26:11 -------------------------------------------------------------------------------- Encounter Discharge Information Details Patient Name: Date of Service: [name], [name] 03/02/2020 1:30 PM Medical Record Number: [id number] Patient Account Number: [id number] Date of Birth/Sex: Treating RN: 1955/06/01 (65 y.o. [name] Primary Care Cleopha Indelicato: MA SO UDI, Willette Pa MA LSA DA T Other  Clinician: Referring Daemian Gahm: Treating Kemoni Quesenberry/Extender: Linton Ham MA SO UDI, ELHA MA LSA DA T Weeks in Treatment: 49 Encounter Discharge Information Items Discharge Condition: Stable Ambulatory Status: Crutches Discharge Destination: Home Transportation: Private Auto Accompanied By: self Schedule Follow-up Appointment: Yes Clinical Summary of Care: Electronic Signature(s) Signed: 03/02/2020 5:25:06 PM By: Deon Pilling Entered By: Deon Pilling on 03/02/2020 15:03:28 -------------------------------------------------------------------------------- Multi Wound Chart Details Patient Name: Date of Service: [name], [name] 03/02/2020 1:30 PM Medical Record Number: [id number] Patient Account Number: [id number] Date of Birth/Sex: Treating RN: 07/03/1955 (64 y.o. [name] Primary Care Robie Mcniel: MA SO UDI, Willette Pa MA LSA DA T Other Clinician: Referring Niaomi Cartaya: Treating Zan Triska/Extender: Linton Ham MA SO UDI, ELHA MA LSA DA T Weeks in Treatment: 49 Vital Signs Height(in): 68 Capillary Blood Glucose(mg/dl): 127 Weight(lbs): 265 Pulse(bpm): 80 Body Mass Index(BMI): 40 Blood Pressure(mmHg): 137/82 Temperature(F): 98.2 Respiratory Rate(breaths/min): 20 Photos: [9:No Photos Right, Medial Foot] [N/A:N/A N/A] Wound Location: [9:Blister] [N/A:N/A] Wounding Event: [9:Diabetic Wound/Ulcer of the Lower] [N/A:N/A] Primary Etiology: [9:Extremity Glaucoma, Chronic sinus] [N/A:N/A] Comorbid History: [9:problems/congestion, Anemia, Sleep Apnea, Hypertension, Type II Diabetes, Rheumatoid Arthritis, Neuropathy 06/18/2018] [N/A:N/A] Date Acquired: [9:57] [N/A:N/A] Weeks of Treatment: [9:Open] [N/A:N/A] Wound Status: [9:1.7x1.4x1.1] [N/A:N/A] Measurements L x W x D (cm) [9:1.869] [N/A:N/A] A (cm) : rea [9:2.056] [N/A:N/A] Volume (cm) : [9:83.00%] [N/A:N/A] % Reduction in A rea: [9:79.20%] [N/A:N/A] % Reduction in Volume: [9:Grade 2] [N/A:N/A] Classification: [9:Medium]  [N/A:N/A] Exudate A mount: [9:Serosanguineous] [N/A:N/A] Exudate Type: [9:red, brown] [N/A:N/A] Exudate Color: [9:Thickened] [N/A:N/A] Wound Margin: [9:Large (67-100%)] [N/A:N/A] Granulation A mount: [9:Pale] [N/A:N/A] Granulation Quality: [9:None Present (0%)] [N/A:N/A] Necrotic A mount: [9:Fat Layer (Subcutaneous Tissue): Yes N/A] Exposed Structures: [9:Fascia: No Tendon: No Muscle: No Joint: No Bone: No Small (1-33%)] [N/A:N/A]  Epithelialization: [9:callous periwound.] [N/A:N/A] Treatment Notes Wound #9 (Foot) Wound Laterality: Right, Medial Cleanser Soap and Water Discharge Instruction: May shower and wash wound with dial antibacterial soap and water prior to dressing change. Byram Ancillary Kit - 15 Day Supply Discharge Instruction: Use supplies as instructed; Kit contains: (15) Saline Bullets; (15) 3x3 Gauze; 15 pr Gloves Peri-Wound Care Sween Lotion (Moisturizing lotion) Discharge Instruction: Apply moisturizing lotion as directed Topical Primary Dressing Promogran Prisma Matrix, 4.34 (sq in) (silver collagen) Discharge Instruction: Moisten collagen with saline or hydrogel Secondary Dressing Woven Gauze Sponge, Non-Sterile 4x4 in Discharge Instruction: Apply over primary dressing as directed. ABD Pad, 5x9 Discharge Instruction: Apply over primary dressing as directed. Felt 2.5 yds x 5.5 in Discharge Instruction: Apply over primary dressing cut to form callous pad around wound Secured With Kerlix Roll Sterile, 4.5x3.1 (in/yd) Discharge Instruction: Secure with Kerlix as directed. Paper Tape, 2x10 (in/yd) Discharge Instruction: Secure dressing with tape as directed. Compression Wrap Compression Stockings Add-Ons Electronic Signature(s) Signed: 03/02/2020 5:50:50 PM By: Levan Hurst RN, BSN Signed: 03/02/2020 5:51:45 PM By: Linton Ham MD Entered By: Linton Ham on 03/02/2020  15:25:33 -------------------------------------------------------------------------------- Multi-Disciplinary Care Plan Details Patient Name: Date of Service: [name], [name] 03/02/2020 1:30 PM Medical Record Number: [id number] Patient Account Number: [id number] Date of Birth/Sex: Treating RN: 03-02-1955 (65 y.o. [name] Primary Care Provider: MA SO UDI, Willette Pa MA LSA DA T Other Clinician: Referring Provider: Treating Provider/Extender: Linton Ham MA SO UDI, ELHA MA LSA DA T Weeks in Treatment: 32 Active Inactive Wound/Skin Impairment Nursing Diagnoses: Impaired tissue integrity Knowledge deficit related to ulceration/compromised skin integrity Goals: Patient/caregiver will verbalize understanding of skin care regimen Date Initiated: 01/28/2019 Target Resolution Date: 03/27/2020 Goal Status: Active Interventions: Assess patient/caregiver ability to obtain necessary supplies Assess patient/caregiver ability to perform ulcer/skin care regimen upon admission and as needed Assess ulceration(s) every visit Provide education on ulcer and skin care Notes: Electronic Signature(s) Signed: 03/02/2020 5:50:50 PM By: Levan Hurst RN, BSN Entered By: Levan Hurst on 03/02/2020 15:25:17 -------------------------------------------------------------------------------- Pain Assessment Details Patient Name: Date of Service: [name], [name] 03/02/2020 1:30 PM Medical Record Number: [id number] Patient Account Number: [id number] Date of Birth/Sex: Treating RN: March 21, 1955 (65 y.o. [name] Primary Care Provider: MA SO UDI, [place] MA LSA DA T Other Clinician: Referring Provider: Treating Provider/Extender: Linton Ham MA SO UDI, ELHA MA LSA DA T Weeks in Treatment: 50 Active Problems Location of Pain Severity and Description of Pain Patient Has Paino No Site Locations Pain Management and Medication Current Pain Management: Electronic Signature(s) Signed:  03/02/2020 2:29:08 PM By: Sandre Kitty Signed: 03/02/2020 5:50:50 PM By: Levan Hurst RN, BSN Entered By: Sandre Kitty on 03/02/2020 13:50:09 -------------------------------------------------------------------------------- Patient/Caregiver Education Details Patient Name: Date of Service: [name] 2/14/2022andnbsp1:30 PM Medical Record Number: [id number] Patient Account Number: [id number] Date of Birth/Gender: Treating RN: [birth year] (65 y.o. [name] Primary Care Physician: MA SO UDI, Willette Pa MA LSA DA T Other Clinician: Referring Physician: Treating Physician/Extender: Linton Ham MA SO UDI, ELHA MA LSA DA T Weeks in Treatment: 62 Education Assessment Education Provided To: Patient Education Topics Provided Wound/Skin Impairment: Methods: Explain/Verbal Responses: State content correctly [employer]) Signed: 03/02/2020 5:50:50 PM By: Levan Hurst RN, BSN Entered By: Levan Hurst on 03/02/2020 15:25:30 -------------------------------------------------------------------------------- Wound Assessment Details Patient Name: Date of Service: [name], [name] 03/02/2020 1:30 PM Medical Record Number: [id number] Patient Account Number: [id number] Date of Birth/Sex: Treating RN: Feb 25, 1955 (65 y.o. [name] Primary Care Provider:  MA SO UDI, ELHA MA LSA DA T Other Clinician: Referring Provider: Treating Provider/Extender: Linton Ham MA SO UDI, ELHA MA LSA DA T Weeks in Treatment: 54 Wound Status Wound Number: 9 Primary Diabetic Wound/Ulcer of the Lower Extremity Etiology: Wound Location: Right, Medial Foot Wound Open Wounding Event: Blister Status: Date Acquired: 06/18/2018 Comorbid Glaucoma, Chronic sinus problems/congestion, Anemia, Sleep Weeks Of Treatment: 57 History: Apnea, Hypertension, Type II Diabetes, Rheumatoid Arthritis, Clustered Wound: No Neuropathy Wound Measurements Length: (cm) 1.7 Width: (cm)  1.4 Depth: (cm) 1.1 Area: (cm) 1.869 Volume: (cm) 2.056 % Reduction in Area: 83% % Reduction in Volume: 79.2% Epithelialization: Small (1-33%) Tunneling: No Undermining: No Wound Description Classification: Grade 2 Wound Margin: Thickened Exudate Amount: Medium Exudate Type: Serosanguineous Exudate Color: red, brown Foul Odor After Cleansing: No Slough/Fibrino No Wound Bed Granulation Amount: Large (67-100%) Exposed Structure Granulation Quality: Pale Fascia Exposed: No Necrotic Amount: None Present (0%) Fat Layer (Subcutaneous Tissue) Exposed: Yes Tendon Exposed: No Muscle Exposed: No Joint Exposed: No Bone Exposed: No Assessment Notes callous periwound. Treatment Notes Wound #9 (Foot) Wound Laterality: Right, Medial Cleanser Soap and Water Discharge Instruction: May shower and wash wound with dial antibacterial soap and water prior to dressing change. Byram Ancillary Kit - 15 Day Supply Discharge Instruction: Use supplies as instructed; Kit contains: (15) Saline Bullets; (15) 3x3 Gauze; 15 pr Gloves Peri-Wound Care Sween Lotion (Moisturizing lotion) Discharge Instruction: Apply moisturizing lotion as directed Topical Primary Dressing Promogran Prisma Matrix, 4.34 (sq in) (silver collagen) Discharge Instruction: Moisten collagen with saline or hydrogel Secondary Dressing Woven Gauze Sponge, Non-Sterile 4x4 in Discharge Instruction: Apply over primary dressing as directed. ABD Pad, 5x9 Discharge Instruction: Apply over primary dressing as directed. Felt 2.5 yds x 5.5 in Discharge Instruction: Apply over primary dressing cut to form callous pad around wound Secured With Kerlix Roll Sterile, 4.5x3.1 (in/yd) Discharge Instruction: Secure with Kerlix as directed. Paper Tape, 2x10 (in/yd) Discharge Instruction: Secure dressing with tape as directed. Compression Wrap Compression Stockings Add-Ons Electronic Signature(s) Signed: 03/02/2020 5:25:06 PM By: Deon Pilling Signed: 03/02/2020 5:50:50 PM By: Levan Hurst RN, BSN Entered By: Deon Pilling on 03/02/2020 14:17:12 -------------------------------------------------------------------------------- Vitals Details Patient Name: Date of Service: [name], [name] 03/02/2020 1:30 PM Medical Record Number: [id number] Patient Account Number: [id number] Date of Birth/Sex: Treating RN: 17-Nov-1955 (65 y.o. [name] Primary Care Provider: MA SO UDI, [place] MA LSA DA T Other Clinician: Referring Provider: Treating Provider/Extender: Linton Ham MA SO UDI, ELHA MA LSA DA T Weeks in Treatment: 70 Vital Signs Time Taken: 13:49 Temperature (F): 98.2 Height (in): 68 Pulse (bpm): 80 Weight (lbs): 265 Respiratory Rate (breaths/min): 20 Body Mass Index (BMI): 40.3 Blood Pressure (mmHg): 137/82 Capillary Blood Glucose (mg/dl): 127 Reference Range: 80 - 120 mg / dl Electronic Signature(s) Signed: 03/02/2020 2:29:08 PM By: Sandre Kitty Entered By: Sandre Kitty on 03/02/2020 13:50:03

## 2020-03-02 NOTE — Progress Notes (Signed)
Sarah, [name] ([id number]) Visit Report for 03/02/2020 HPI Details Patient Name: Date of Service: [name], [name] 03/02/2020 1:30 PM Medical Record Number: [id number] Patient Account Number: [id number] Date of Birth/Sex: Treating RN: 1955-12-04 (65 y.o. [name] Primary Care Provider: MA SO UDI, Ly[place] MA LSA DA T Other Clinician: Referring Provider: Treating Provider/Extender: Linton Ham MA SO UDI, ELHA MA LSA DA T Weeks in Treatment: 79 History of Present Illness HPI Description: 65 yrs old bf here for follow up recurrent left charcot foot ulcer. She has DM. She is not a smoker. She states a blister developed 3 months ago at site of previous breakdown from 1 year ago. It was debrided at the podiatrist's office. She went to the ER and then sent here. She denies pain. Xray was negative for osteo. Culture from this clinic negative. 09/08/14 [name] to [name] for Charcot foot. Seen by him and MRI scheduled 09/19/14. Prior silver collagen but this was not ordered last visit, patient has been rinsing with saline alone. Re institute silver collagen every other day. F/u 2 weeks with Dr. Dellia Nims as Labor Day holiday. Supplies ordered. 09/25/14; this is a patient with a Charcot foot on the left. she has a wound over the plantar aspect of her left calcaneus. She has been seen by [name] of orthopedic surgery and has had an MRI. She is apparently going within the next week or 2 for corrective surgery which will involve an external fixator. I don't have any of the details of this. 10/06/14; The patient is a 65 yrs old bf with a left Charcot foot and ulcer on the plantar. 12/01/14 Returns post surgery with [name]. Has follow up visit later this week with him, currently in facility and NWB over this extremity. States no drainage from foot and no current wound care. On exam callus and scab in place, suspect healed. Has external fixator in place. Will defer to Dr.  [name] for management, ok to place moisturizing cream over scabs and callus and she will call if she requires follow up here. READMISSION 01/28/2019 Patient was in the clinic in 2016 for a wound on the plantar aspect of her left calcaneus. Predominantly cared for by Dr. [name] she was referred to [name] and had corrective surgery for the position of her left ankle I believe. She had previously been here in 2015 and then prior to that in 2011 2012 that I do not have these records. More recently she has developed fairly extensive wounds on the right medial foot, left lateral foot and a small area on the right medial great toe. Right medial foot wound has been there for 6 to 7 months, left foot wound for 2 to 3 months and the right great toe only over the last few weeks. She has been followed by [name] at Dr. [name] office it sounds as though she has been undergoing callus paring and silver nitrate. The patient has been washing these off with soap and water and plan applying dry dressing Past medical history, type 2 diabetes well controlled with a recent hemoglobin A1c of 7 on 11/16, type 2 diabetes with peripheral neuropathy, previous left Charcot joint surgery bilateral Charcot joints currently, stage III chronic renal failure, hypertension, obstructive sleep apnea, history of MRSA and gastroesophageal reflux. 1/18; this is a patient with bilateral Charcot feet. Plain x-rays that I did of both of these wounds that are either at bone or precariously close to bone  were done. On the right foot this showed possible lytic destruction involving the anterior aspect of the talus which may represent a wound osteomyelitis. An MRI was recommended. On the left there was no definite lytic destruction although the wound is deep undermining's and I think probably requires an MRI on this basis in any case 1/25; patient was supposed to have her MRI last Friday of her bilateral feet however they would  not do it because there was silver alginate in her dressings, will replace with calcium alginate today and will see if we can get her done today 2/1; patient did not get her MRIs done last week because of issues with the MRI department receiving orders to do bilateral feet. She has 2 deep wounds in the setting of Charcot feet deformities bilaterally. Both of them at one point had exposed bone although I had trouble demonstrating that today. I am not sure that we can offload these very aggressively as I watched her leave the clinic last week her gait is very narrow and unsteady. 2/15; the MRI of her right foot did not show osteomyelitis potentially osteonecrosis of the talus. 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 The wounds are a large wound on the right medial foot and on the left a substantial wound on the left lateral plantar calcaneus. We have been using silver alginate. Ultimately I think we need to consider a total contact cast on the right while we work through the possibility of osteomyelitis in the left. I watched her walk out with her crutches I have some trepidation about the ability to walk with the cast on her right foot. Nevertheless with her Charcot deformities I do not think there is a way to heal these short of offloading them aggressively. 2/22; the culture of the bone in the left foot that I did last week showed a few Citrobacter Koseri as well as some streptococcal species. In my attempted bone for pathology did not actually yield a lot of bone the pathologist did not identify any osteomyelitis. 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 Is promised today and aggressive debridement of the right foot and a total contact cast which surprisingly she seemed to tolerate quite well 03/13/2019  upon evaluation today patient appears to be doing well with her total contact cast she is here for the first cast change. She had no areas of rubbing or any other complications at this point. Overall things are doing quite well. 2/26; the patient was kindly seen by Dr. Megan Salon of infectious disease on 2/25. She is now on IV vancomycin PICC line placement in 2 days and oral Flagyl. This was in response to her MRI showing osteomyelitis of the left calcaneus concerning for osteomyelitis. We have been putting her right leg in a total contact cast. Our nurses report drainage. She is tolerating the total contact cast well. 3/4; the patient is started her IV antibiotics and is taking IV vancomycin and oral Flagyl. She appears to be tolerating these both well. This is for osteomyelitis involving the left calcaneus. The area on the right in the setting of bilateral Charcot deformities did not have any bone infection on MRI. We put a total contact cast on her with silver alginate as the primary dressing and she returns today in follow-up. Per  our intake nurse there was too much drainage to leave this on all week therefore we will bring her back for an early change 3/8; follow-up total contact cast she is still having a lot of drainage probably too much drainage from the right foot wound will week with the same cast. She will be back on Thursday. The area on the left looks somewhat better 3/11; patient here for a total contact cast change. 3/15; patient came in for wound care evaluation. She is still on IV vancomycin and oral Flagyl for osteomyelitis in the left foot. The area on the right foot we are putting in a total contact cast. 3/18 for total contact cast change on the right foot 3/22; the patient was here for wound review today. The area on the right foot is slightly smaller in terms of surface area. However the surface of the wound is very gritty and hyper granulated. More problematically the area on  the plantar left foot has increased depth indeed in the center of this it probes to bone. We have been using Hydrofera Blue in both areas. She is on antibiotics as prescribed by infectious disease IV vancomycin and oral Flagyl 3/25; the patient is in for total contact cast change. Our intake nurse was concerned about the amount of drainage which soaked right to the multiple layers we are putting on this. Wondered about options. The wound bed actually looks as healthy as this is looked although there may not be a lot of change in dimensions things are looking a lot better. If we are going to heal this woman's wound which is in the middle of her right Charcot foot this is going to need to be offloaded. There are not many alternatives 3/29; still not a lot of improvement although the surface of the right wound looks better. We have been using Hydrofera Blue 04/17/2019 upon evaluation today patient appears to be doing well with a total contact cast. With actually having to change this twice a week. She saw Dr. Dellia Nims for wound care earlier in the week this is for the cast change today. That is due to the fact that she has significant drainage. Overall the wound is been looking excellent however. 4/5; we continue to put a total contact cast on this patient with Hydrofera Blue. Still a lot of drainage which precludes a simple weekly change or changing this twice a week. 4/8; total contact cast changed today. Apparently the wound looks better per our intake nurse 4/12; patient here for wound care evaluation. Wounds on the right foot have not changed in dimension none about a month left foot looks similar. Apparently her antibiotics that we are giving her for osteomyelitis in the left foot complete on Wednesday. We have been using Hydrofera Blue on the right foot under a total contact cast. Nurses report greenish drainage although I think this may be discoloration from the [facility] 4/15; back for cast  change wounds look quite good although still moderate amount of drainage. T contact cast reapplied otal 4/19;. Wound evaluation. Wound on the right foot is smaller surface looks healthy. There is still the divot in the middle part of this wound but I could not feel any bone. Area on the left foot also looks better She has completed her antibiotics but still has the PICC line in. 4/23; patient comes back for a total contact cast change this was done in the standard fashion no issues 4/26; wound measures slightly larger on the right  foot which is disappointing. She sees Dr. Megan Salon with regards to her antibiotics on Wednesday. I believe she is on vancomycin IV and oral Flagyl. 4/29; in for her routine total contact cast change. She saw Dr. Megan Salon this morning. He is asking about antibiotic continuation I will be in touch with him. I did not look at her wound today 5/3; patient saw Dr. Megan Salon on 4/28. I did send him a note asking about the continuation of perhaps an oral regimen. I have not heard back. She still has an elevated sedimentation rate at 81. Her wounds are smaller. 5/6; in for her total contact cast change. We did not look at the wound today. She has seen Dr. Megan Salon and is now on oral Keflex and Flagyl. She seems to be tolerating these well 5/10; she had her PICC line removed. The wound on the right foot after some initial improvement has not made any improvement even with the total contact cast. Still having a lot of drainage. Likewise the area on the left foot really is not any better either 5/14; in for a cast change today. We will look at the right foot on Monday. If this is not making any progress I am going to try to cast the left foot. 5/17; the wound on the right foot is absolutely no different. I am going to put ointment on this area and change the cast of the left foot. We have been using silver alginate there 5/21; apparently the area on the right foot was better per our  intake nurse although I did not see it. We did a standard total contact cast change on the left foot we have been applying silver alginate to the wound here. 06/10/19-Area on the right foot per dimensions slightly better, the left foot has still some depth applying silver alginate to the wound with TCC 06/13/19-Patient here for Casting to Left foot 6/1; patient had the total contact cast were placed on the left foot. Noted some swelling in her right leg although the dimensions of the wound on the right foot are better. We have been using PolyMem on the right and silver alginate on the left under the cast 6/7; we continue to make nice progress in both her plantar feet wound. We are putting the total contact cast on the left. Surprisingly in spite of this the area on the right medial foot in the middle of her Charcot deformities actually is making progress as well 07/01/2019 upon evaluation today patient actually appears to be doing quite well with regard to her wounds on the feet compared to when I saw her last that has been quite some time. With that being said I do believe the total contact cast has been beneficial in this left foot and she has a lot of signs of improvement she does have some callus overhanging which is caused a little bit of a ledge and an issue here. I do believe that we may be able to do something to help in that regard. Fortunately otherwise the wound seems to be progressing quite nicely. 6/21; the patient's area on the left foot that we are currently casting is just about closed. The area on the right medial foot has a healthy looking surface but may be not changed as much from last week in terms of surface area. We are using silver alginate on the right and polymen Ag under the cast 6/28; only a small open area on the left foot remains. Using silver alginate  under a total contact cast. She has a diabetic shoe I have asked her to bring that for next week where this may be closed.  The area on the right foot with a Charcot deformity is also measuring slightly smaller. We are using PolyMem Ag here 7/6; left foot is totally closed. She does not require a total contact cast. She will graduate to her own diabetic shoe. The area on the right foot with a Charcot deformity measuring about the same. We have been using polymen under compression wraps. She did not do well in this area with a total contact cast 7/13; left foot remains intact. She is skillfully covering this area with foam and using her diabetic shoe. Slight area improvement of the right foot wound. Still rolled senescent looking edges. We have been using polymen 7/19; left foot remains intact. Wound measuring slightly smaller on the right foot in the middle of the Charcot deformity. We have been using PolyMem Ag 7/26; per the patient left foot remains intact. The wound on the right foot looks slightly smaller but dimensions are measuring the same. We have been using polymen Ag 8/9-Patient back after 2 weeks, wound of the right foot again looking slightly smaller, continuing to use PolyMem silver 8/16; patient has made some minor improvements in wound area on the right foot. Using polymen Ag. The left foot remains healed 8/23; continued slight improvement in dimensions. Rolled edges around the wound edge noted. We have been using polymen Ag. Patient offloading with a surgical shoe and crutches 8/30; no change in surface area. Thick rolled skin and subcutaneous tissue around the edges debris on the wound surface. We have been using polymen Ag initially with some improvement however stalled on the last 2 weeks in terms of surface area 9/13; surface area is smaller still using polymen Ag. Thick rolled skin and subcutaneous tissue around the edges requiring debridement. 9/20; again surface area is improved using polymen Ag however each week she develops thick rolled skin and subcutaneous tissue around the wound  edges requiring debridement. She is offloading this using crutches 9/27; we've been using PolyMem AG. Wound does not look any different from last week. Raised edges of callus skin and subcutaneous tissue around the wound bed. 10/5; we been using polymen Ag. No ability to put a total contact cast on today in the clinic. Wound is about the same still rolled edges of thick callus around the margins 10/18; we put her in a total contact cast last week things did not go well. Not only does the wound looked worse macerated angry with thick callus and some warmth around it. She had another wound above this and one on the anterior right leg. I am not totally certain why this happened. She has not been systemically unwell 10/25; culture from last week grew Pseudomonas and MRSA. My experience with this MRSA is usually a true infection and Pseudomonas a colonizer I am going to put her on doxycycline today we put gentamicin on the wound surface. Continuing with silver alginate 11/1; she has tolerated her antibiotics. I gave her doxycycline. Was still using gentamicin. The original wound looks about the same thick rolled edges. The cast injury just above it is superficial 11/8; even after an aggressive debridement last week the wound on her medial Charcot foot on the right looks about the same. The surface looks satisfactory however rolled edges of callus thick skin and subcutaneous tissue. She did not do well on 2 separate occasions in a  total contact cast. The cast abrasion she has on the medial foot is just above closed. We have been using polymen Ag. She is attempting to offload this is much as she can with her crutches 11/15; the wound on the Charcot right foot about the same in terms of surface area.. The area above this on the ankle is healed. 11/29; Charcot right foot wound. This is medially. Wound may be slightly smaller however still raised edges of callus and skin around the wound circumference. We  have been using polymen 12/6; Charcot right foot wound. This is medially on the plantar aspect. I did not debride this today wound looks about the same in a week she is reappeared with raised edges of callus and thick skin around the wound. We have been using polymen no real improvement 12/13; again an extensive debridement today. Thick callus skin and subcutaneous tissue around the wound margin with undermining. We have been using silver collagen since last week 12/20; in spite of the debridement last week the wound really looks no difference. Circumferential callus shallow wound granulation does not look too bad. We have been using silver collagen really no improvement 1/10 patient comes in with a wound looking about the same. Circumferential callus and subcutaneous tissue. I did not debride this today we have been using Sorbact and wrapping her leg. Even putting her in a total contact cast did not help here. She has an appointment with [name] on 2/9. Question of whether a surgical procedure to correct the deformity underneath this area might help. 02/05/2020 upon evaluation today patient actually appears to be stalling to some degree here. Fortunately there is no signs of active infection at this time. She does have a significant callus buildup and therefore before rewrapping the staff wanted me to see if I could see her and remove some of the callus. I was definitely happy to do that today. 1/24; the patient arrives today with a cleaner looking her wound circumference but with the same rolled edges. Change to silver alginate last time because of drainage. Wound surface looks somewhat better although it precariously close to the underlying bone here. 1/31; she arrives with a continued improvement in the wound circumference less callus less thick skin. I changed her back to silver collagen today as the surface appears to be improved and the granulation looks stable. She sees [name] on  2/9 2/7; patient sees [name] on 2/9. Arrives with a considerable amount of thick callus around this wound the area is essentially unchanged she has been using silver collagen 2/14; [name] is ordered a CT scan of her foot on 2/22; we are using silver collagen. She had an MRI last year of this foot that did not show osteomyelitis I think that is what he is looking to determine however. Apparently a plain x-ray that was done that was negative Electronic Signature(s) Signed: 03/02/2020 5:51:45 PM By: Linton Ham MD Entered By: Linton Ham on 03/02/2020 15:26:21 -------------------------------------------------------------------------------- Physical Exam Details Patient Name: Date of Service: [name], Editha J. 03/02/2020 1:30 PM Medical Record Number: [id number] Patient Account Number: [id number] Date of Birth/Sex: Treating RN: 1955-03-24 (65 y.o. [name] Primary Care Provider: MA SO UDI, [place] MA LSA DA T Other Clinician: Referring Provider: Treating Provider/Extender: Linton Ham MA SO UDI, ELHA MA LSA DA T Weeks in Treatment: 18 Constitutional Sitting or standing Blood Pressure is within target range for patient.. Pulse regular and within target range for patient.Marland Kitchen Respirations regular, non-labored  and within target range.. Temperature is normal and within the target range for the patient.Marland Kitchen Appears in no distress. Notes Wound exam; not much change here. Thick rolled edge of callus around the margins. I did remove this many times without effect. Surface of the wound does not look too bad there is no exposed bone and no evidence of infection. Pedal pulses are palpable Electronic Signature(s) Signed: 03/02/2020 5:51:45 PM By: Linton Ham MD Entered By: Linton Ham on 03/02/2020 15:27:43 -------------------------------------------------------------------------------- Physician Orders Details Patient Name: Date of Service: [facility], Britni J. 03/02/2020  1:30 PM Medical Record Number: [id number] Patient Account Number: [id number] Date of Birth/Sex: Treating RN: 24-Jul-1955 (65 y.o. [name] Primary Care Provider: MA SO UDI, [place] MA LSA DA T Other Clinician: Referring Provider: Treating Provider/Extender: Linton Ham MA SO UDI, ELHA MA LSA DA T Weeks in Treatment: 38 Verbal / Phone Orders: No Diagnosis Coding ICD-10 Coding Code Description E11.621 Type 2 diabetes mellitus with foot ulcer L97.511 Non-pressure chronic ulcer of other part of right foot limited to breakdown of skin E11.42 Type 2 diabetes mellitus with diabetic polyneuropathy M14.671 Charcot's joint, right ankle and foot Follow-up Appointments Return Appointment in 2 weeks. Bathing/ Shower/ Hygiene May shower with protection but do not get wound dressing(s) wet. Edema Control - Lymphedema / SCD / Other Elevate legs to the level of the heart or above for 30 minutes daily and/or when sitting, a frequency of: - throughout the day Avoid standing for long periods of time. Patient to wear own compression stockings every day. - left leg daily Moisturize legs daily. - left leg daily Wound Treatment Wound #9 - Foot Wound Laterality: Right, Medial Cleanser: Soap and Water Every Other Day/15 Days Discharge Instructions: May shower and wash wound with dial antibacterial soap and water prior to dressing change. Cleanser: [place] Ancillary Kit - 15 Day Supply (DME) (Generic) Every Other Day/15 Days Discharge Instructions: Use supplies as instructed; Kit contains: (15) Saline Bullets; (15) 3x3 Gauze; 15 pr Gloves Peri-Wound Care: Sween Lotion (Moisturizing lotion) Every Other Day/15 Days Discharge Instructions: Apply moisturizing lotion as directed Prim Dressing: Promogran Prisma Matrix, 4.34 (sq in) (silver collagen) (DME) (Generic) Every Other Day/15 Days ary Discharge Instructions: Moisten collagen with saline or hydrogel Secondary Dressing: Woven Gauze Sponge,  Non-Sterile 4x4 in (DME) (Generic) Every Other Day/15 Days Discharge Instructions: Apply over primary dressing as directed. Secondary Dressing: ABD Pad, 5x9 (DME) Every Other Day/15 Days Discharge Instructions: Apply over primary dressing as directed. Secondary Dressing: Felt 2.5 yds x 5.5 in Every Other Day/15 Days Discharge Instructions: Apply over primary dressing cut to form callous pad around wound Secured With: Kerlix Roll Sterile, 4.5x3.1 (in/yd) (DME) (Generic) Every Other Day/15 Days Discharge Instructions: Secure with Kerlix as directed. Secured With: Paper Tape, 2x10 (in/yd) (DME) (Generic) Every Other Day/15 Days Discharge Instructions: Secure dressing with tape as directed. Electronic Signature(s) Signed: 03/02/2020 5:50:50 PM By: Levan Hurst RN, BSN Signed: 03/02/2020 5:51:45 PM By: Linton Ham MD Entered By: Levan Hurst on 03/02/2020 14:42:11 -------------------------------------------------------------------------------- Problem List Details Patient Name: Date of Service: [name], Dniyah J. 03/02/2020 1:30 PM Medical Record Number: [id number] Patient Account Number: [id number] Date of Birth/Sex: Treating RN: 1955-08-06 (65 y.o. [name] Primary Care Provider: MA Particia Lather MA LSA DA T Other Clinician: Referring Provider: Treating Provider/Extender: Linton Ham MA SO UDI, ELHA MA LSA DA T Weeks in Treatment: 72 Active Problems ICD-10 Encounter Code Description Active Date MDM Diagnosis E11.621 Type 2 diabetes mellitus with foot  ulcer 01/28/2019 No Yes L97.511 Non-pressure chronic ulcer of other part of right foot limited to breakdown of 01/28/2019 No Yes skin E11.42 Type 2 diabetes mellitus with diabetic polyneuropathy 01/28/2019 No Yes M14.671 Charcot's joint, right ankle and foot 01/28/2019 No Yes Inactive Problems ICD-10 Code Description Active Date Inactive Date L97.524 Non-pressure chronic ulcer of other part of left foot with necrosis  of bone 01/28/2019 01/28/2019 L97.514 Non-pressure chronic ulcer of other part of right foot with necrosis of bone 01/28/2019 01/28/2019 R44.315 Charcot's joint, left ankle and foot 01/28/2019 01/28/2019 Q00.867 Other chronic osteomyelitis, left ankle and foot 04/08/2019 04/08/2019 Resolved Problems Electronic Signature(s) Signed: 03/02/2020 5:51:45 PM By: Linton Ham MD Entered By: Linton Ham on 03/02/2020 15:25:25 -------------------------------------------------------------------------------- Progress Note Details Patient Name: Date of Service: ENO Elige Ko, Indica J. 03/02/2020 1:30 PM Medical Record Number: [id number] Patient Account Number: [id number] Date of Birth/Sex: Treating RN: Oct 21, 1955 (65 y.o. [name] Primary Care Provider: MA SO UDI, [place] MA LSA DA T Other Clinician: Referring Provider: Treating Provider/Extender: Linton Ham MA SO UDI, ELHA MA LSA DA T Weeks in Treatment: 41 Subjective History of Present Illness (HPI) 65 yrs old bf here for follow up recurrent left charcot foot ulcer. She has DM. She is not a smoker. She states a blister developed 3 months ago at site of previous breakdown from 1 year ago. It was debrided at the podiatrist's office. She went to the ER and then sent here. She denies pain. Xray was negative for osteo. Culture from this clinic negative. 09/08/14 [address] [name] to [name] for Charcot foot. Seen by him and MRI scheduled 09/19/14. Prior silver collagen but this was not ordered last visit, patient has been rinsing with saline alone. Re institute silver collagen every other day. F/u 2 weeks with Dr. Dellia Nims as Labor Day holiday. Supplies ordered. 09/25/14; this is a patient with a Charcot foot on the left. she has a wound over the plantar aspect of her left calcaneus. She has been seen by [name] of orthopedic surgery and has had an MRI. She is apparently going within the next week or 2 for corrective surgery which will involve  an external fixator. I don't have any of the details of this. 10/06/14; The patient is a 65 yrs old bf with a left Charcot foot and ulcer on the plantar. 12/01/14 Returns post surgery with [name]. Has follow up visit later this week with him, currently in facility and NWB over this extremity. States no drainage from foot and no current wound care. On exam callus and scab in place, suspect healed. Has external fixator in place. Will defer to [name] for management, ok to place moisturizing cream over scabs and callus and she will call if she requires follow up here. READMISSION 01/28/2019 Patient was in the clinic in 2016 for a wound on the plantar aspect of her left calcaneus. Predominantly cared for by Dr. [name] she was referred to [name] and had corrective surgery for the position of her left ankle I believe. She had previously been here in 2015 and then prior to that in 2011 2012 that I do not have these records. More recently she has developed fairly extensive wounds on the right medial foot, left lateral foot and a small area on the right medial great toe. Right medial foot wound has been there for 6 to 7 months, left foot wound for 2 to 3 months and the right great toe only over the last few  weeks. She has been followed by [name] at Dr. Laverta [place] office it sounds as though she has been undergoing callus paring and silver nitrate. The patient has been washing these off with soap and water and plan applying dry dressing Past medical history, type 2 diabetes well controlled with a recent hemoglobin A1c of 7 on 11/16, type 2 diabetes with peripheral neuropathy, previous left Charcot joint surgery bilateral Charcot joints currently, stage III chronic renal failure, hypertension, obstructive sleep apnea, history of MRSA and gastroesophageal reflux. 1/18; this is a patient with bilateral Charcot feet. Plain x-rays that I did of both of these wounds that are either at bone or  precariously close to bone were done. On the right foot this showed possible lytic destruction involving the anterior aspect of the talus which may represent a wound osteomyelitis. An MRI was recommended. On the left there was no definite lytic destruction although the wound is deep undermining's and I think probably requires an MRI on this basis in any case 1/25; patient was supposed to have her MRI last Friday of her bilateral feet however they would not do it because there was silver alginate in her dressings, will replace with calcium alginate today and will see if we can get her done today 2/1; patient did not get her MRIs done last week because of issues with the MRI department receiving orders to do bilateral feet. She has 2 deep wounds in the setting of Charcot feet deformities bilaterally. Both of them at one point had exposed bone although I had trouble demonstrating that today. I am not sure that we can offload these very aggressively as I watched her leave the clinic last week her gait is very narrow and unsteady. 2/15; the MRI of her right foot did not show osteomyelitis potentially osteonecrosis of the talus. 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 The wounds are a large wound on the right medial foot and on the left a substantial wound on the left lateral plantar calcaneus. We have been using silver alginate. Ultimately I think we need to consider a total contact cast on the right while we work through the possibility of osteomyelitis in the left. I watched her walk out with her crutches I have some trepidation about the ability to walk with the cast on her right foot. Nevertheless with her Charcot deformities I do not think there is a way to heal these short of offloading them aggressively. 2/22; the culture of the bone in the left foot that I did last week showed a few Citrobacter Koseri as well as some streptococcal species. In my  attempted bone for pathology did not actually yield a lot of bone the pathologist did not identify any osteomyelitis. 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 Is promised today and aggressive debridement of the right foot and a total contact cast which surprisingly she seemed to tolerate quite well 03/13/2019 upon evaluation today patient appears to be doing well with her total contact cast she is here for the first cast change. She had no areas of rubbing or any other complications at this point. Overall things are doing quite well. 2/26; the patient was kindly seen by Dr. Orvan Falconer of infectious disease on 2/25. She is now on IV vancomycin PICC line placement in 2 days  and oral Flagyl. This was in response to her MRI showing osteomyelitis of the left calcaneus concerning for osteomyelitis. We have been putting her right leg in a total contact cast. Our nurses report drainage. She is tolerating the total contact cast well. 3/4; the patient is started her IV antibiotics and is taking IV vancomycin and oral Flagyl. She appears to be tolerating these both well. This is for osteomyelitis involving the left calcaneus. The area on the right in the setting of bilateral Charcot deformities did not have any bone infection on MRI. We put a total contact cast on her with silver alginate as the primary dressing and she returns today in follow-up. Per our intake nurse there was too much drainage to leave this on all week therefore we will bring her back for an early change 3/8; follow-up total contact cast she is still having a lot of drainage probably too much drainage from the right foot wound will week with the same cast. She will be back on Thursday. The area on the left looks somewhat better 3/11; patient here for a total contact cast change. 3/15; patient came  in for wound care evaluation. She is still on IV vancomycin and oral Flagyl for osteomyelitis in the left foot. The area on the right foot we are putting in a total contact cast. 3/18 for total contact cast change on the right foot 3/22; the patient was here for wound review today. The area on the right foot is slightly smaller in terms of surface area. However the surface of the wound is very gritty and hyper granulated. More problematically the area on the plantar left foot has increased depth indeed in the center of this it probes to bone. We have been using Hydrofera Blue in both areas. She is on antibiotics as prescribed by infectious disease IV vancomycin and oral Flagyl 3/25; the patient is in for total contact cast change. Our intake nurse was concerned about the amount of drainage which soaked right to the multiple layers we are putting on this. Wondered about options. The wound bed actually looks as healthy as this is looked although there may not be a lot of change in dimensions things are looking a lot better. If we are going to heal this woman's wound which is in the middle of her right Charcot foot this is going to need to be offloaded. There are not many alternatives 3/29; still not a lot of improvement although the surface of the right wound looks better. We have been using Hydrofera Blue 04/17/2019 upon evaluation today patient appears to be doing well with a total contact cast. With actually having to change this twice a week. She saw Dr. Dellia Nims for wound care earlier in the week this is for the cast change today. That is due to the fact that she has significant drainage. Overall the wound is been looking excellent however. 4/5; we continue to put a total contact cast on this patient with Hydrofera Blue. Still a lot of drainage which precludes a simple weekly change or changing this twice a week. 4/8; total contact cast changed today. Apparently the wound looks better per our intake  nurse 4/12; patient here for wound care evaluation. Wounds on the right foot have not changed in dimension none about a month left foot looks similar. Apparently her antibiotics that we are giving her for osteomyelitis in the left foot complete on Wednesday. We have been using Hydrofera Blue on the right foot under a  total contact cast. Nurses report greenish drainage although I think this may be discoloration from the [facility] 4/15; back for cast change wounds look quite good although still moderate amount of drainage. T contact cast reapplied otal 4/19;. Wound evaluation. Wound on the right foot is smaller surface looks healthy. There is still the divot in the middle part of this wound but I could not feel any bone. Area on the left foot also looks better She has completed her antibiotics but still has the PICC line in. 4/23; patient comes back for a total contact cast change this was done in the standard fashion no issues 4/26; wound measures slightly larger on the right foot which is disappointing. She sees Dr. Megan Salon with regards to her antibiotics on Wednesday. I believe she is on vancomycin IV and oral Flagyl. 4/29; in for her routine total contact cast change. She saw Dr. Megan Salon this morning. He is asking about antibiotic continuation I will be in touch with him. I did not look at her wound today 5/3; patient saw Dr. Megan Salon on 4/28. I did send him a note asking about the continuation of perhaps an oral regimen. I have not heard back. She still has an elevated sedimentation rate at 81. Her wounds are smaller. 5/6; in for her total contact cast change. We did not look at the wound today. She has seen Dr. Megan Salon and is now on oral Keflex and Flagyl. She seems to be tolerating these well 5/10; she had her PICC line removed. The wound on the right foot after some initial improvement has not made any improvement even with the total contact cast. Still having a lot of drainage.  Likewise the area on the left foot really is not any better either 5/14; in for a cast change today. We will look at the right foot on Monday. If this is not making any progress I am going to try to cast the left foot. 5/17; the wound on the right foot is absolutely no different. I am going to put ointment on this area and change the cast of the left foot. We have been using silver alginate there 5/21; apparently the area on the right foot was better per our intake nurse although I did not see it. We did a standard total contact cast change on the left foot we have been applying silver alginate to the wound here. 06/10/19-Area on the right foot per dimensions slightly better, the left foot has still some depth applying silver alginate to the wound with TCC 06/13/19-Patient here for Casting to Left foot 6/1; patient had the total contact cast were placed on the left foot. Noted some swelling in her right leg although the dimensions of the wound on the right foot are better. We have been using PolyMem on the right and silver alginate on the left under the cast 6/7; we continue to make nice progress in both her plantar feet wound. We are putting the total contact cast on the left. Surprisingly in spite of this the area on the right medial foot in the middle of her Charcot deformities actually is making progress as well 07/01/2019 upon evaluation today patient actually appears to be doing quite well with regard to her wounds on the feet compared to when I saw her last that has been quite some time. With that being said I do believe the total contact cast has been beneficial in this left foot and she has a lot of signs of improvement  she does have some callus overhanging which is caused a little bit of a ledge and an issue here. I do believe that we may be able to do something to help in that regard. Fortunately otherwise the wound seems to be progressing quite nicely. 6/21; the patient's area on the left  foot that we are currently casting is just about closed. The area on the right medial foot has a healthy looking surface but may be not changed as much from last week in terms of surface area. We are using silver alginate on the right and polymen Ag under the cast 6/28; only a small open area on the left foot remains. Using silver alginate under a total contact cast. She has a diabetic shoe I have asked her to bring that for next week where this may be closed. The area on the right foot with a Charcot deformity is also measuring slightly smaller. We are using PolyMem Ag here 7/6; left foot is totally closed. She does not require a total contact cast. She will graduate to her own diabetic shoe. ooThe area on the right foot with a Charcot deformity measuring about the same. We have been using polymen under compression wraps. She did not do well in this area with a total contact cast 7/13; left foot remains intact. She is skillfully covering this area with foam and using her diabetic shoe. Slight area improvement of the right foot wound. Still rolled senescent looking edges. We have been using polymen 7/19; left foot remains intact. Wound measuring slightly smaller on the right foot in the middle of the Charcot deformity. We have been using PolyMem Ag 7/26; per the patient left foot remains intact. The wound on the right foot looks slightly smaller but dimensions are measuring the same. We have been using polymen Ag 8/9-Patient back after 2 weeks, wound of the right foot again looking slightly smaller, continuing to use PolyMem silver 8/16; patient has made some minor improvements in wound area on the right foot. Using polymen Ag. The left foot remains healed 8/23; continued slight improvement in dimensions. Rolled edges around the wound edge noted. We have been using polymen Ag. Patient offloading with a surgical shoe and crutches 8/30; no change in surface area. Thick rolled skin and subcutaneous  tissue around the edges debris on the wound surface. We have been using polymen Ag initially with some improvement however stalled on the last 2 weeks in terms of surface area 9/13; surface area is smaller still using polymen Ag. Thick rolled skin and subcutaneous tissue around the edges requiring debridement. 9/20; again surface area is improved using polymen Ag however each week she develops thick rolled skin and subcutaneous tissue around the wound edges requiring debridement. She is offloading this using crutches 9/27; we've been using PolyMem AG. Wound does not look any different from last week. Raised edges of callus skin and subcutaneous tissue around the wound bed. 10/5; we been using polymen Ag. No ability to put a total contact cast on today in the clinic. Wound is about the same still rolled edges of thick callus around the margins 10/18; we put her in a total contact cast last week things did not go well. Not only does the wound looked worse macerated angry with thick callus and some warmth around it. She had another wound above this and one on the anterior right leg. I am not totally certain why this happened. She has not been systemically unwell 10/25; culture from last week  grew Pseudomonas and MRSA. My experience with this MRSA is usually a true infection and Pseudomonas a colonizer I am going to put her on doxycycline today we put gentamicin on the wound surface. Continuing with silver alginate 11/1; she has tolerated her antibiotics. I gave her doxycycline. Was still using gentamicin. The original wound looks about the same thick rolled edges. The cast injury just above it is superficial 11/8; even after an aggressive debridement last week the wound on her medial Charcot foot on the right looks about the same. The surface looks satisfactory however rolled edges of callus thick skin and subcutaneous tissue. She did not do well on 2 separate occasions in a total contact cast. The  cast abrasion she has on the medial foot is just above closed. We have been using polymen Ag. She is attempting to offload this is much as she can with her crutches 11/15; the wound on the Charcot right foot about the same in terms of surface area.. The area above this on the ankle is healed. 11/29; Charcot right foot wound. This is medially. Wound may be slightly smaller however still raised edges of callus and skin around the wound circumference. We have been using polymen 12/6; Charcot right foot wound. This is medially on the plantar aspect. I did not debride this today wound looks about the same in a week she is reappeared with raised edges of callus and thick skin around the wound. We have been using polymen no real improvement 12/13; again an extensive debridement today. Thick callus skin and subcutaneous tissue around the wound margin with undermining. We have been using silver collagen since last week 12/20; in spite of the debridement last week the wound really looks no difference. Circumferential callus shallow wound granulation does not look too bad. We have been using silver collagen really no improvement 1/10 patient comes in with a wound looking about the same. Circumferential callus and subcutaneous tissue. I did not debride this today we have been using Sorbact and wrapping her leg. Even putting her in a total contact cast did not help here. She has an appointment with [name] on 2/9. Question of whether a surgical procedure to correct the deformity underneath this area might help. 02/05/2020 upon evaluation today patient actually appears to be stalling to some degree here. Fortunately there is no signs of active infection at this time. She does have a significant callus buildup and therefore before rewrapping the staff wanted me to see if I could see her and remove some of the callus. I was definitely happy to do that today. 1/24; the patient arrives today with a cleaner looking  her wound circumference but with the same rolled edges. Change to silver alginate last time because of drainage. Wound surface looks somewhat better although it precariously close to the underlying bone here. 1/31; she arrives with a continued improvement in the wound circumference less callus less thick skin. I changed her back to silver collagen today as the surface appears to be improved and the granulation looks stable. She sees [name] on 2/9 2/7; patient sees [name] on 2/9. Arrives with a considerable amount of thick callus around this wound the area is essentially unchanged she has been using silver collagen 2/14; [name] is ordered a CT scan of her foot on 2/22; we are using silver collagen. She had an MRI last year of this foot that did not show osteomyelitis I think that is what he is looking to determine  however. Apparently a plain x-ray that was done that was negative Objective Constitutional Sitting or standing Blood Pressure is within target range for patient.. Pulse regular and within target range for patient.Marland Kitchen Respirations regular, non-labored and within target range.. Temperature is normal and within the target range for the patient.Marland Kitchen Appears in no distress. Vitals Time Taken: 1:49 PM, Height: 68 in, Weight: 265 lbs, BMI: 40.3, Temperature: 98.2 F, Pulse: 80 bpm, Respiratory Rate: 20 breaths/min, Blood Pressure: 137/82 mmHg, Capillary Blood Glucose: 127 mg/dl. General Notes: Wound exam; not much change here. Thick rolled edge of callus around the margins. I did remove this many times without effect. Surface of the wound does not look too bad there is no exposed bone and no evidence of infection. Pedal pulses are palpable Integumentary (Hair, Skin) Wound #9 status is Open. Original cause of wound was Blister. The wound is located on the Right,Medial Foot. The wound measures 1.7cm length x 1.4cm width x 1.1cm depth; 1.869cm^2 area and 2.056cm^3 volume. There is Fat Layer  (Subcutaneous Tissue) exposed. There is no tunneling or undermining noted. There is a medium amount of serosanguineous drainage noted. The wound margin is thickened. There is large (67-100%) pale granulation within the wound bed. There is no necrotic tissue within the wound bed. General Notes: callous periwound. 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 Bathing/ Shower/ Hygiene: May shower with protection but do not get wound dressing(s) wet. Edema Control - Lymphedema / SCD / Other: Elevate legs to the level of the heart or above for 30 minutes daily and/or when sitting, a frequency of: - throughout the day Avoid standing for long periods of time. Patient to wear own compression stockings every day. - left leg daily Moisturize legs daily. - left leg daily WOUND #9: - Foot Wound Laterality: Right, Medial Cleanser: Soap and Water Every Other Day/15 Days Discharge Instructions: May shower and wash wound with dial antibacterial soap and water prior to dressing change. Cleanser: [employer] Ancillary Kit - 15 Day Supply (DME) (Generic) Every Other Day/15 Days Discharge Instructions: Use supplies as instructed; Kit contains: (15) Saline Bullets; (15) 3x3 Gauze; 15 pr Gloves Peri-Wound Care: Sween Lotion (Moisturizing lotion) Every Other Day/15 Days Discharge Instructions: Apply moisturizing lotion as directed Prim Dressing: Promogran Prisma Matrix, 4.34 (sq in) (silver collagen) (DME) (Generic) Every Other Day/15 Days ary Discharge Instructions: Moisten collagen with saline or hydrogel Secondary Dressing: Woven Gauze Sponge, Non-Sterile 4x4 in (DME) (Generic) Every Other Day/15 Days Discharge Instructions: Apply over primary  dressing as directed. Secondary Dressing: ABD Pad, 5x9 (DME) Every Other Day/15 Days Discharge Instructions: Apply over primary dressing as directed. Secondary Dressing: Felt 2.5 yds x 5.5 in Every Other Day/15 Days Discharge Instructions: Apply over primary dressing cut to form callous pad around wound Secured With: Kerlix Roll Sterile, 4.5x3.1 (in/yd) (DME) (Generic) Every Other Day/15 Days Discharge Instructions: Secure with Kerlix as directed. Secured With: Paper T ape, 2x10 (in/yd) (DME) (Generic) Every Other Day/15 Days Discharge Instructions: Secure dressing with tape as directed. 1. I have not changed the primary dressing. The patient is changing this herself 2. She continues to offload this with crutches. 3. 2 prolonged attempts at a total contact cast did not help this area Electronic Signature(s) Signed: 03/02/2020 5:51:45 PM By: Linton Ham MD  Entered By: Linton Ham on 03/02/2020 15:28:47 -------------------------------------------------------------------------------- SuperBill Details Patient Name: Date of Service: [facility], Laurencia J. 03/02/2020 Medical Record Number: [id number] Patient Account Number: [id number] Date of Birth/Sex: Treating RN: 1955/07/26 (65 y.o. 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

## 2020-03-09 ENCOUNTER — Encounter (HOSPITAL_BASED_OUTPATIENT_CLINIC_OR_DEPARTMENT_OTHER): Payer: Medicare Other | Admitting: Internal Medicine

## 2020-03-10 ENCOUNTER — Ambulatory Visit
Admission: RE | Admit: 2020-03-10 | Discharge: 2020-03-10 | Disposition: A | Discharge status: Home / Self Care | Payer: Medicare Other | Source: Ambulatory Visit | Attending: Orthopedic Surgery | Admitting: Orthopedic Surgery

## 2020-03-10 ENCOUNTER — Other Ambulatory Visit: Payer: Self-pay

## 2020-03-10 DIAGNOSIS — M14671 Charcot's joint, right ankle and foot: Secondary | ICD-10-CM

## 2020-03-11 ENCOUNTER — Ambulatory Visit (INDEPENDENT_AMBULATORY_CARE_PROVIDER_SITE_OTHER): Payer: Medicare Other | Admitting: Internal Medicine

## 2020-03-11 ENCOUNTER — Other Ambulatory Visit: Payer: Self-pay

## 2020-03-11 ENCOUNTER — Encounter: Payer: Self-pay | Admitting: Internal Medicine

## 2020-03-11 VITALS — BP 119/72 | HR 76 | Temp 98.0°F | Ht 68.0 in | Wt 247.4 lb

## 2020-03-11 DIAGNOSIS — L97519 Non-pressure chronic ulcer of other part of right foot with unspecified severity: Secondary | ICD-10-CM

## 2020-03-11 DIAGNOSIS — E11621 Type 2 diabetes mellitus with foot ulcer: Secondary | ICD-10-CM

## 2020-03-11 DIAGNOSIS — L97529 Non-pressure chronic ulcer of other part of left foot with unspecified severity: Secondary | ICD-10-CM

## 2020-03-11 DIAGNOSIS — E1169 Type 2 diabetes mellitus with other specified complication: Secondary | ICD-10-CM

## 2020-03-11 DIAGNOSIS — I10 Essential (primary) hypertension: Secondary | ICD-10-CM

## 2020-03-11 LAB — POCT GLYCOSYLATED HEMOGLOBIN (HGB A1C): Hemoglobin A1C: 6.3 % — AB (ref 4.0–5.6)

## 2020-03-11 LAB — GLUCOSE, CAPILLARY: Glucose-Capillary: 89 mg/dL (ref 70–99)

## 2020-03-11 NOTE — Assessment & Plan Note (Signed)
Checking BMP today. Micro Alb/Cr ratio was nl last time.   -BMP

## 2020-03-11 NOTE — Assessment & Plan Note (Addendum)
BP well controled.  -Continue Lisinopril 10 mg QD

## 2020-03-11 NOTE — Assessment & Plan Note (Signed)
This has been a chronic issue. Patient follows with wound care team frequently. CT scan of rt foot resulted today and did not show osteomyelitis.  -f/u with wound care

## 2020-03-11 NOTE — Assessment & Plan Note (Signed)
On statin. Checking Lipid profile today  -Lipid panel -Continue Lipitor 40 mg QD

## 2020-03-11 NOTE — Assessment & Plan Note (Signed)
DM: HbA1c well controlled on current regimen of Janumet 50-500 mg twice daily. Last Hb A1c checked on 12/17/2018 and was  6.3  -Checking HbA1c today -Continue Janumet 50-500 mg BID -She is on aspirin and statin for cardiovascular risk control

## 2020-03-11 NOTE — Patient Instructions (Signed)
Thank you for allowing [place] to provide your care today!  I am glad that you are doing well. The Ct scan of your foot that was ordered by another physician did not show bone infection.  Please continue follow up with wound care. Your blood pressure is great! Continue great job of taking your medications and heart healthy low salt diet.  I have ordered some labs for you. I will call if any are abnormal and if we need to change the management.  Today we made no changes to your medications.    Please come back to clinic in 3-6 or earlier as needed. Should you have any questions or concerns please call the internal medicine clinic at [phone].    Thank you!

## 2020-03-11 NOTE — Progress Notes (Signed)
Established Patient Office Visit  Subjective:  Patient ID: [name], female    DOB: 08-Dec-1955  Age: 65 y.o. MRN: [id number]  CC:  Chief Complaint  Patient presents with  . Follow-up    HERE FOR ASSESSMENT FOR POWER WHEEL CHAIR MEDICATION  REFILL ROUTINE OFFICE VISIT    HPI [name] presents for f/u of DM and HTN. Please refer to problem based charting for further details and assessment and plan of current problem and chronic medical conditions.  Past medical history: HTN, OSA, GERD, type II DM, Charcot foot, CKD 3, HLD, normocytic anemia, obesity   Past Medical History:  Diagnosis Date  . Chronic normocytic anemia   . CKD (chronic kidney disease) stage 3, GFR 30-59 ml/min (HCC) 02/02/2013  . Diabetes mellitus type II, controlled (HCC) 06/17/2009  . Diabetic ulcer of both feet (HCC) 01/07/2010   Qualifier: Diagnosis of  By: [name] MD, Amanjot    . Essential hypertension 06/17/2009  . GERD (gastroesophageal reflux disease)   . Glaucoma   . H/O hiatal hernia   . Hyperlipidemia   . MRSA (methicillin resistant Staphylococcus aureus)   . Obstructive sleep apnea 05/26/2010    Past Surgical History:  Procedure Laterality Date  . ACHILLES TENDON LENGTHENING Left 11/06/2014   Procedure: LEFT ACHILLES TENDON LENGTHENING, ;  Surgeon: Toni Arthurs, MD;  Location: [facility] OR;  Service: Orthopedics;  Laterality: Left;  . APPENDECTOMY  1975  . FOOT ARTHRODESIS Left 11/06/2014   Procedure: TALONAVICULAR CANCELLOUS CUBOID ARTHRODESIS WITH CORRECTIVE OSTEOTOMY ;  Surgeon: Toni Arthurs, MD;  Location: [facility] OR;  Service: Orthopedics;  Laterality: Left;  . HARDWARE REMOVAL Left 02/19/2015   Procedure: REMOVAL OF LEFT FOOT EXTERNAL FIXATOR, CASTING OF LEFT ANKLE ;  Surgeon: Toni Arthurs, MD;  Location: [facility];  Service: Orthopedics;  Laterality: Left;  . I & D EXTREMITY Left 2011   "ulcer on my foot got infected" (01/30/2013)  . MULTIPLE TOOTH EXTRACTIONS Bilateral   .  OOPHORECTOMY Bilateral 1975-1976    Family History  Problem Relation Age of Onset  . Diabetes Mother   . Hypertension Mother   . Dementia Mother   . Heart disease Father   . Stroke Father   . Diabetes Father   . Hypertension Father   . Gout Father   . Deep vein thrombosis Father   . Breast cancer Cousin   . Breast cancer Cousin     Social History   Socioeconomic History  . Marital status: Single    Spouse name: Not on file  . Number of children: Not on file  . Years of education: Not on file  . Highest education level: Not on file  Occupational History  . Occupation: Disabled  Tobacco Use  . Smoking status: Former Smoker    Packs/day: 0.50    Years: 0.00    Pack years: 0.00    Types: Cigarettes    Quit date: 12/22/1972    Years since quitting: 47.2  . Smokeless tobacco: Never Used  Vaping Use  . Vaping Use: Never used  Substance and Sexual Activity  . Alcohol use: No  . Drug use: No  . Sexual activity: Not Currently  Other Topics Concern  . 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   Smoked when she was a teenager   no alcohol   no Drugs      Current  Social History 10/26/2018        Patient lives on the main level of a 3 floor boarding house with 3 other tenants. There are 3 steps with handrails up to the entrance the patient uses.       Patient's method of transportation is personal car.      The highest level of education was Bachelor's Degree.      The patient currently disabled.      Identified important Relationships are "My cousins, [name] and Lianne Moris, and my dear friend Mliss Sax."      Pets : None       Interests / Fun: "I love to read, write, photography: go around [place] taking photos of the [address], [employer], Art Museums, cooking, hanging out with friends, several ministries with my Church, especially working with the youth."      Current  Stressors: "Worrying about someone to care for me if anything happens to me. 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Ducatte, BSN, RN-BC          Social Determinants of Health   Financial Resource Strain: Not on file  Food Insecurity: Not on file  Transportation Needs: Not on file  Physical Activity: Not on file  Stress: Not on file  Social Connections: Not on file  Intimate Partner Violence: Not on file    Outpatient Medications Prior to Visit  Medication Sig Dispense Refill  . ACCU-CHEK SOFTCLIX LANCETS lancets Use to check blood glucose up to three times a day. Dx code E 11.9 100 each 12  . acetaminophen (TYLENOL) 500 MG tablet Take 500-1,000 mg by mouth every 6 (six) hours as needed for moderate pain or headache.    Marland Kitchen aspirin EC 81 MG tablet Take 1 tablet (81 mg total) by mouth daily. 90 tablet 3  . atorvastatin (LIPITOR) 40 MG tablet TAKE ONE TABLET BY MOUTH DAILY 90 tablet 3  . Biotin 1000 MCG tablet Take 1,000 mcg by mouth daily.    . brimonidine-timolol (COMBIGAN) 0.2-0.5 % ophthalmic solution Place 1 drop into both eyes every 12 (twelve) hours. Place one drop to both eyes twice daily for glaucoma (Patient taking differently: Place 1 drop into both eyes every 12 (twelve) hours. ) 15 mL 0  . calcium-vitamin D (OSCAL-500) 500-400 MG-UNIT tablet Take 1 tablet by mouth 2 (two) times daily.     . Cholecalciferol (VITAMIN D3) 25 MCG (1000 UT) CAPS Take 1 capsule by mouth daily.    . cycloSPORINE (RESTASIS) 0.05 % ophthalmic emulsion Place 1 drop into both eyes 2 (two) times daily.    . Emollient (BAG BALM EX) Apply 1 application topically daily.    . ferrous sulfate 325 (65 FE) MG tablet Take 1 tablet (325 mg total) by mouth daily with breakfast. 90 tablet 1  . glucose blood (ACCU-CHEK AVIVA) test strip Use to check blood glucose up to three times a day. Dx code E 11.9 100 each 12  . lisinopril (ZESTRIL) 10  MG tablet TAKE ONE TABLET BY MOUTH DAILY 90 tablet 1  . Multiple Vitamin (MULTIVITAMIN) tablet Take 1 tablet by mouth daily.     . Netarsudil-Latanoprost (ROCKLATAN) 0.02-0.005 % SOLN Apply 1 drop to eye daily at 10 pm.    . pantoprazole (PROTONIX) 20 MG tablet TAKE ONE TABLET BY MOUTH TWICE A DAY 180 tablet 2  . Polyvinyl Alcohol-Povidone (  REFRESH OP) Place 1 drop into both eyes daily as needed (dry eyes).    . SitaGLIPtin-MetFORMIN HCl (JANUMET XR) 50-500 MG TB24 Take 1 tablet by mouth 2 (two) times daily. 180 tablet 0  . vitamin B-12 (CYANOCOBALAMIN) 1000 MCG tablet Take 1,000 mcg by mouth daily.    . vitamin C (ASCORBIC ACID) 500 MG tablet Take 500 mg by mouth daily.    . Zinc 50 MG CAPS Take 50 mg by mouth daily.      No facility-administered medications prior to visit.    Allergies  Allergen Reactions  . Codeine Itching    swelling in throat  . Pravastatin Other (See Comments)    cramps  . Latex Rash    ROS Review of Systems   Negative except as mentioned in HPI and assessment and plan.  Objective:    Physical Exam  BP 119/72 (BP Location: Left Arm, Patient Position: Sitting, Cuff Size: Normal)   Pulse 76   Temp 98 F (36.7 C) (Oral)   Ht  (1.727 m)   Wt 247 lb 6.4 oz (112.2 kg)   SpO2 100%   BMI 37.62 kg/m  Wt Readings from Last 3 Encounters:  03/11/20 247 lb 6.4 oz (112.2 kg)  12/17/19 255 lb 1.6 oz (115.7 kg)  09/16/19 243 lb 11.2 oz (110.5 kg)   Constitutional: Pleasant lady. In no acute distress.  Head: Normocephalic and atraumatic.  Eyes: Conjunctivae are normal, EOM nl Cardiovascular: RRR, nl S1S2, no murmur, no LEE Respiratory: Effort normal and breath sounds normal. No respiratory distress. No wheezes.  GI: Soft. No distension. There is no tenderness.  Neurological: Is alert and oriented x 3 MSK: Rt  side charcote foot deformity  Psychiatric: Normal mood and affect. Behavior is normal. Judgment and thought content normal.   Health Maintenance  Due  Topic Date Due  . LIPID PANEL  06/23/2017  . FOOT EXAM  02/09/2019  . MAMMOGRAM  03/21/2019  . TETANUS/TDAP  12/30/2019    There are no preventive care reminders to display for this patient.  Lab Results  Component Value Date   TSH 0.673 09/06/2012   Lab Results  Component Value Date   WBC 8.2 02/28/2019   HGB 9.3 (L) 02/28/2019   HCT 30.5 (L) 02/28/2019   MCV 85 02/28/2019   PLT 331 02/28/2019   Lab Results  Component Value Date   NA 138 02/28/2019   K 4.7 02/28/2019   CO2 23 02/28/2019   GLUCOSE 131 (H) 02/28/2019   BUN 21 02/28/2019   CREATININE 1.22 (H) 02/28/2019   BILITOT <0.2 (L) 01/30/2013   ALKPHOS 65 02/06/2015   AST 13 02/06/2015   ALT 6 (A) 02/06/2015   PROT 8.0 01/30/2013   ALBUMIN 3.9 01/30/2013   CALCIUM 9.5 02/28/2019   ANIONGAP 7 10/29/2014   Lab Results  Component Value Date   CHOL 89 (L) 06/23/2016   Lab Results  Component Value Date   HDL 41 06/23/2016   Lab Results  Component Value Date   LDLCALC 35 06/23/2016   Lab Results  Component Value Date   TRIG 64 06/23/2016   Lab Results  Component Value Date   CHOLHDL 2.2 06/23/2016   Lab Results  Component Value Date   HGBA1C 6.3 (A) 03/11/2020      Assessment & Plan:   Problem List Items Addressed This Visit      Cardiovascular and Mediastinum   Essential hypertension - Primary (Chronic)    BP well  controled.  -Continue Lisinopril 10 mg QD       Relevant Orders   BMP8+Anion Gap     Endocrine   Diabetes mellitus type II, controlled (HCC) (Chronic)    DM: HbA1c well controlled on current regimen of Janumet 50-500 mg twice daily. Last Hb A1c checked on 12/17/2018 and was  6.3  -Checking HbA1c today -Continue Janumet 50-500 mg BID -She is on aspirin and statin for cardiovascular risk control         Relevant Orders   POC Hbg A1C (Completed)   Lipid Profile   Diabetic ulcer of both feet (HCC)    This has been a chronic issue. Patient follows with wound  care team frequently. CT scan of rt foot resulted today and did not show osteomyelitis.  -f/u with wound care          No orders of the defined types were placed in this encounter.   Follow-up: Return in about 6 months (around 09/08/2020) for DM and HTN f/u.    Chevis Pretty, MD

## 2020-03-12 ENCOUNTER — Encounter: Payer: Medicare Other | Admitting: Internal Medicine

## 2020-03-12 LAB — BMP8+ANION GAP
Anion Gap: 13 mmol/L (ref 10.0–18.0)
BUN/Creatinine Ratio: 18 (ref 12–28)
BUN: 21 mg/dL (ref 8–27)
CO2: 24 mmol/L (ref 20–29)
Calcium: 10.1 mg/dL (ref 8.7–10.3)
Chloride: 104 mmol/L (ref 96–106)
Creatinine, Ser: 1.2 mg/dL — ABNORMAL HIGH (ref 0.57–1.00)
GFR calc Af Amer: 55 mL/min/{1.73_m2} — ABNORMAL LOW (ref >59)
GFR calc non Af Amer: 48 mL/min/{1.73_m2} — ABNORMAL LOW (ref >59)
Glucose: 89 mg/dL (ref 65–99)
Potassium: 4.9 mmol/L (ref 3.5–5.2)
Sodium: 141 mmol/L (ref 134–144)

## 2020-03-12 LAB — LIPID PANEL
Chol/HDL Ratio: 2.4 ratio (ref 0.0–4.4)
Cholesterol, Total: 111 mg/dL (ref 100–199)
HDL: 46 mg/dL (ref >39)
LDL Chol Calc (NIH): 49 mg/dL (ref 0–99)
Triglycerides: 80 mg/dL (ref 0–149)
VLDL Cholesterol Cal: 16 mg/dL (ref 5–40)

## 2020-03-12 NOTE — Progress Notes (Signed)
Internal Medicine Clinic Attending  Case discussed with Dr. Masoudi  At the time of the visit.  We reviewed the resident's history and exam and pertinent patient test results.  I agree with the assessment, diagnosis, and plan of care documented in the resident's note.  

## 2020-03-16 ENCOUNTER — Other Ambulatory Visit: Payer: Self-pay

## 2020-03-16 ENCOUNTER — Encounter (HOSPITAL_BASED_OUTPATIENT_CLINIC_OR_DEPARTMENT_OTHER): Payer: Medicare Other | Admitting: Internal Medicine

## 2020-03-16 DIAGNOSIS — E11621 Type 2 diabetes mellitus with foot ulcer: Secondary | ICD-10-CM | POA: Diagnosis not present

## 2020-03-16 NOTE — Progress Notes (Signed)
[name] ([id number]) Visit Report for 03/16/2020 Debridement Details Patient Name: Date of Service: [name], [name] 03/16/2020 1:30 PM Medical Record Number: [id number] Patient Account Number: [id number] Date of Birth/Sex: Treating RN: 17-Dec-1955 (65 y.o. [name] Primary Care Provider: MA SO [name], [place] MA LSA DA [name] Other Clinician: Referring Provider: Treating Provider/Extender: [name], [name] Weeks in Treatment: 60 Debridement Performed for Assessment: Wound #9 Right,Medial Foot Performed By: Physician [name]., MD Debridement Type: Debridement Severity of Tissue Pre Debridement: Fat layer exposed Level of Consciousness (Pre-procedure): Awake and Alert Pre-procedure Verification/Time Out Yes - 14:21 Taken: Start Time: 14:21 [name] Area Debrided (L x W): otal 1.6 (cm) x 1.5 (cm) = 2.4 (cm) Tissue and other material debrided: Viable, Non-Viable, Callus, Subcutaneous, Skin: Epidermis Level: Skin/Subcutaneous Tissue Debridement Description: Excisional Instrument: Blade, Forceps Bleeding: Moderate Hemostasis Achieved: Pressure End Time: 14:24 Procedural Pain: 0 Post Procedural Pain: 0 Response to Treatment: Procedure was tolerated well Level of Consciousness (Post- Awake and Alert procedure): Post Debridement Measurements of Total Wound Length: (cm) 1.6 Width: (cm) 1.5 Depth: (cm) 1 Volume: (cm) 1.885 Character of Wound/Ulcer Post Debridement: Improved Severity of Tissue Post Debridement: Fat layer exposed Post Procedure Diagnosis Same as Pre-procedure Electronic Signature(s) Signed: 03/16/2020 5:56:44 PM By: Sarah Hurst RN, BSN Signed: 03/16/2020 6:21:54 PM By: Sarah Ham MD Entered By: [name] on 03/16/2020 14:29:37 -------------------------------------------------------------------------------- HPI Details Patient Name: Date of Service: [facility], [name] 03/16/2020 1:30 PM Medical Record Number:  [id number] Patient Account Number: [id number] Date of Birth/Sex: Treating RN: [birth year] (65 y.o. [name] Primary Care Provider: MA SO [name], [place] MA LSA DA [name] Other Clinician: Referring Provider: Treating Provider/Extender: [name], [name] Weeks in Treatment: 74 History of Present Illness HPI Description: 65 yrs old bf here for follow up recurrent left charcot foot ulcer. She has DM. She is not a smoker. She states a blister developed 3 months ago at site of previous breakdown from 1 year ago. It was debrided at the podiatrist's office. She went to the ER and then sent here. She denies pain. Xray was negative for osteo. Culture from this clinic negative. 09/08/14 Referred by [name] to [name] for Charcot foot. Seen by him and MRI scheduled 09/19/14. Prior silver collagen but this was not ordered last visit, patient has been rinsing with saline alone. Re institute silver collagen every other day. F/u 2 weeks with [name] as Labor Day holiday. Supplies ordered. 09/25/14; this is a patient with a Charcot foot on the left. she has a wound over the plantar aspect of her left calcaneus. She has been seen by [name] of orthopedic surgery and has had an MRI. She is apparently going within the next week or 2 for corrective surgery which will involve an external fixator. I don'[name] have any of the details of this. 10/06/14; The patient is a 65 yrs old bf with a left Charcot foot and ulcer on the plantar. 12/01/14 Returns post surgery with [name]. Has follow up visit later this week with him, currently in facility and NWB over this extremity. States no drainage from foot and no current wound care. On exam callus and scab in place, suspect healed. Has external fixator in place. Will defer to [name] for management, ok to place moisturizing cream over scabs and callus and she will call if she requires follow up here. READMISSION 01/28/2019 Patient  was in the  clinic in 2016 for a wound on the plantar aspect of her left calcaneus. Predominantly cared for by Dr. Marla [name] she was referred to [name] and had corrective surgery for the position of her left ankle I believe. She had previously been here in 2015 and then prior to that in 2011 2012 that I do not have these records. More recently she has developed fairly extensive wounds on the right medial foot, left lateral foot and a small area on the right medial great toe. Right medial foot wound has been there for 6 to 7 months, left foot wound for 2 to 3 months and the right great toe only over the last few weeks. She has been followed by [name] at Dr. [name] office it sounds as though she has been undergoing callus paring and silver nitrate. The patient has been washing these off with soap and water and plan applying dry dressing Past medical history, type 2 diabetes well controlled with a recent hemoglobin A1c of 7 on 11/16, type 2 diabetes with peripheral neuropathy, previous left Charcot joint surgery bilateral Charcot joints currently, stage III chronic renal failure, hypertension, obstructive sleep apnea, history of MRSA and gastroesophageal reflux. 1/18; this is a patient with bilateral Charcot feet. Plain x-rays that I did of both of these wounds that are either at bone or precariously close to bone were done. On the right foot this showed possible lytic destruction involving the anterior aspect of the talus which may represent a wound osteomyelitis. An MRI was recommended. On the left there was no definite lytic destruction although the wound is deep undermining's and I think probably requires an MRI on this basis in any case 1/25; patient was supposed to have her MRI last Friday of her bilateral feet however they would not do it because there was silver alginate in her dressings, will replace with calcium alginate today and will see if we can get her done today 2/1; patient did not get  her MRIs done last week because of issues with the MRI department receiving orders to do bilateral feet. She has 2 deep wounds in the setting of Charcot feet deformities bilaterally. Both of them at one point had exposed bone although I had trouble demonstrating that today. I am not sure that we can offload these very aggressively as I watched her leave the clinic last week her gait is very narrow and unsteady. 2/15; the MRI of her right foot did not show osteomyelitis potentially osteonecrosis of the talus. 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 The wounds are a large wound on the right medial foot and on the left a substantial wound on the left lateral plantar calcaneus. We have been using silver alginate. Ultimately I think we need to consider a total contact cast on the right while we work through the possibility of osteomyelitis in the left. I watched her walk out with her crutches I have some trepidation about the ability to walk with the cast on her right foot. Nevertheless with her Charcot deformities I do not think there is a way to heal these short of offloading them aggressively. 2/22; the culture of the bone in the left foot that I did last week showed a few Citrobacter Koseri as well as some streptococcal species. In my attempted bone for pathology did not actually yield a lot of bone the pathologist did not identify any osteomyelitis. 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 Is promised today and aggressive debridement of the right foot and a total contact cast which surprisingly she seemed to tolerate quite well 03/13/2019 upon evaluation today patient appears to be doing well with her total contact cast she is here for the first cast change. She had no areas of rubbing or any other  complications at this point. Overall things are doing quite well. 2/26; the patient was kindly seen by Dr. Megan Salon of infectious disease on 2/25. She is now on IV vancomycin PICC line placement in 2 days and oral Flagyl. This was in response to her MRI showing osteomyelitis of the left calcaneus concerning for osteomyelitis. We have been putting her right leg in a total contact cast. Our nurses report drainage. She is tolerating the total contact cast well. 3/4; the patient is started her IV antibiotics and is taking IV vancomycin and oral Flagyl. She appears to be tolerating these both well. This is for osteomyelitis involving the left calcaneus. The area on the right in the setting of bilateral Charcot deformities did not have any bone infection on MRI. We put a total contact cast on her with silver alginate as the primary dressing and she returns today in follow-up. Per our intake nurse there was too much drainage to leave this on all week therefore we will bring her back for an early change 3/8; follow-up total contact cast she is still having a lot of drainage probably too much drainage from the right foot wound will week with the same cast. She will be back on Thursday. The area on the left looks somewhat better 3/11; patient here for a total contact cast change. 3/15; patient came in for wound care evaluation. She is still on IV vancomycin and oral Flagyl for osteomyelitis in the left foot. The area on the right foot we are putting in a total contact cast. 3/18 for total contact cast change on the right foot 3/22; the patient was here for wound review today. The area on the right foot is slightly smaller in terms of surface area. However the surface of the wound is very gritty and hyper granulated. More problematically the area on the plantar left foot has increased depth indeed in the center of this it probes to bone. We have been using Hydrofera Blue in both areas. She is on antibiotics as  prescribed by infectious disease IV vancomycin and oral Flagyl 3/25; the patient is in for total contact cast change. Our intake nurse was concerned about the amount of drainage which soaked right to the multiple layers we are putting on this. Wondered about options. The wound bed actually looks as healthy as this is looked although there may not be a lot of change in dimensions things are looking a lot better. If we are going to heal this woman's wound which is in the middle of her right Charcot foot this is going to need to be offloaded. There are not many alternatives 3/29; still not a lot of improvement although the surface of the right wound looks better. We have been using Hydrofera Blue 04/17/2019 upon evaluation today patient appears to be doing well with a total contact cast. With actually having to change this twice a week. She saw [name] for wound care earlier in the week this is for the cast change today.  That is due to the fact that she has significant drainage. Overall the wound is been looking excellent however. 4/5; we continue to put a total contact cast on this patient with Hydrofera Blue. Still a lot of drainage which precludes a simple weekly change or changing this twice a week. 4/8; total contact cast changed today. Apparently the wound looks better per our intake nurse 4/12; patient here for wound care evaluation. Wounds on the right foot have not changed in dimension none about a month left foot looks similar. Apparently her antibiotics that we are giving her for osteomyelitis in the left foot complete on Wednesday. We have been using Hydrofera Blue on the right foot under a total contact cast. Nurses report greenish drainage although I think this may be discoloration from the [facility] 4/15; back for cast change wounds look quite good although still moderate amount of drainage. [name] contact cast reapplied otal 4/19;. Wound evaluation. Wound on the right foot is smaller  surface looks healthy. There is still the divot in the middle part of this wound but I could not feel any bone. Area on the left foot also looks better She has completed her antibiotics but still has the PICC line in. 4/23; patient comes back for a total contact cast change this was done in the standard fashion no issues 4/26; wound measures slightly larger on the right foot which is disappointing. She sees Dr. Megan Salon with regards to her antibiotics on Wednesday. I believe she is on vancomycin IV and oral Flagyl. 4/29; in for her routine total contact cast change. She saw Dr. Megan Salon this morning. He is asking about antibiotic continuation I will be in touch with him. I did not look at her wound today 5/3; patient saw Dr. Megan Salon on 4/28. I did send him a note asking about the continuation of perhaps an oral regimen. I have not heard back. She still has an elevated sedimentation rate at 81. Her wounds are smaller. 5/6; in for her total contact cast change. We did not look at the wound today. She has seen Dr. Megan Salon and is now on oral Keflex and Flagyl. She seems to be tolerating these well 5/10; she had her PICC line removed. The wound on the right foot after some initial improvement has not made any improvement even with the total contact cast. Still having a lot of drainage. Likewise the area on the left foot really is not any better either 5/14; in for a cast change today. We will look at the right foot on Monday. If this is not making any progress I am going to try to cast the left foot. 5/17; the wound on the right foot is absolutely no different. I am going to put ointment on this area and change the cast of the left foot. We have been using silver alginate there 5/21; apparently the area on the right foot was better per our intake nurse although I did not see it. We did a standard total contact cast change on the left foot we have been applying silver alginate to the wound  here. 06/10/19-Area on the right foot per dimensions slightly better, the left foot has still some depth applying silver alginate to the wound with TCC 06/13/19-Patient here for Casting to Left foot 6/1; patient had the total contact cast were placed on the left foot. Noted some swelling in her right leg although the dimensions of the wound on the right foot are better. We have been using  PolyMem on the right and silver alginate on the left under the cast 6/7; we continue to make nice progress in both her plantar feet wound. We are putting the total contact cast on the left. Surprisingly in spite of this the area on the right medial foot in the middle of her Charcot deformities actually is making progress as well 07/01/2019 upon evaluation today patient actually appears to be doing quite well with regard to her wounds on the feet compared to when I saw her last that has been quite some time. With that being said I do believe the total contact cast has been beneficial in this left foot and she has a lot of signs of improvement she does have some callus overhanging which is caused a little bit of a ledge and an issue here. I do believe that we may be able to do something to help in that regard. Fortunately otherwise the wound seems to be progressing quite nicely. 6/21; the patient's area on the left foot that we are currently casting is just about closed. The area on the right medial foot has a healthy looking surface but may be not changed as much from last week in terms of surface area. We are using silver alginate on the right and polymen Ag under the cast 6/28; only a small open area on the left foot remains. Using silver alginate under a total contact cast. She has a diabetic shoe I have asked her to bring that for next week where this may be closed. The area on the right foot with a Charcot deformity is also measuring slightly smaller. We are using PolyMem Ag here 7/6; left foot is totally closed.  She does not require a total contact cast. She will graduate to her own diabetic shoe. The area on the right foot with a Charcot deformity measuring about the same. We have been using polymen under compression wraps. She did not do well in this area with a total contact cast 7/13; left foot remains intact. She is skillfully covering this area with foam and using her diabetic shoe. Slight area improvement of the right foot wound. Still rolled senescent looking edges. We have been using polymen 7/19; left foot remains intact. Wound measuring slightly smaller on the right foot in the middle of the Charcot deformity. We have been using PolyMem Ag 7/26; per the patient left foot remains intact. The wound on the right foot looks slightly smaller but dimensions are measuring the same. We have been using polymen Ag 8/9-Patient back after 2 weeks, wound of the right foot again looking slightly smaller, continuing to use PolyMem silver 8/16; patient has made some minor improvements in wound area on the right foot. Using polymen Ag. The left foot remains healed 8/23; continued slight improvement in dimensions. Rolled edges around the wound edge noted. We have been using polymen Ag. Patient offloading with a surgical shoe and crutches 8/30; no change in surface area. Thick rolled skin and subcutaneous tissue around the edges debris on the wound surface. We have been using polymen Ag initially with some improvement however stalled on the last 2 weeks in terms of surface area 9/13; surface area is smaller still using polymen Ag. Thick rolled skin and subcutaneous tissue around the edges requiring debridement. 9/20; again surface area is improved using polymen Ag however each week she develops thick rolled skin and subcutaneous tissue around the wound edges requiring debridement. She is offloading this using crutches 9/27; we've been using PolyMem  AG. Wound does not look any different from last week. Raised edges  of callus skin and subcutaneous tissue around the wound bed. 10/5; we been using polymen Ag. No ability to put a total contact cast on today in the clinic. Wound is about the same still rolled edges of thick callus around the margins 10/18; we put her in a total contact cast last week things did not go well. Not only does the wound looked worse macerated angry with thick callus and some warmth around it. She had another wound above this and one on the anterior right leg. I am not totally certain why this happened. She has not been systemically unwell 10/25; culture from last week grew Pseudomonas and MRSA. My experience with this MRSA is usually a true infection and Pseudomonas a colonizer I am going to put her on doxycycline today we put gentamicin on the wound surface. Continuing with silver alginate 11/1; she has tolerated her antibiotics. I gave her doxycycline. Was still using gentamicin. The original wound looks about the same thick rolled edges. The cast injury just above it is superficial 11/8; even after an aggressive debridement last week the wound on her medial Charcot foot on the right looks about the same. The surface looks satisfactory however rolled edges of callus thick skin and subcutaneous tissue. She did not do well on 2 separate occasions in a total contact cast. The cast abrasion she has on the medial foot is just above closed. We have been using polymen Ag. She is attempting to offload this is much as she can with her crutches 11/15; the wound on the Charcot right foot about the same in terms of surface area.. The area above this on the ankle is healed. 11/29; Charcot right foot wound. This is medially. Wound may be slightly smaller however still raised edges of callus and skin around the wound circumference. We have been using polymen 12/6; Charcot right foot wound. This is medially on the plantar aspect. I did not debride this today wound looks about the same in a week she is  reappeared with raised edges of callus and thick skin around the wound. We have been using polymen no real improvement 12/13; again an extensive debridement today. Thick callus skin and subcutaneous tissue around the wound margin with undermining. We have been using silver collagen since last week 12/20; in spite of the debridement last week the wound really looks no difference. Circumferential callus shallow wound granulation does not look too bad. We have been using silver collagen really no improvement 1/10 patient comes in with a wound looking about the same. Circumferential callus and subcutaneous tissue. I did not debride this today we have been using Sorbact and wrapping her leg. Even putting her in a total contact cast did not help here. She has an appointment with [name] on 2/9. Question of whether a surgical procedure to correct the deformity underneath this area might help. 02/05/2020 upon evaluation today patient actually appears to be stalling to some degree here. Fortunately there is no signs of active infection at this time. She does have a significant callus buildup and therefore before rewrapping the staff wanted me to see if I could see her and remove some of the callus. I was definitely happy to do that today. 1/24; the patient arrives today with a cleaner looking her wound circumference but with the same rolled edges. Change to silver alginate last time because of drainage. Wound surface looks somewhat better although it  precariously close to the underlying bone here. 1/31; she arrives with a continued improvement in the wound circumference less callus less thick skin. I changed her back to silver collagen today as the surface appears to be improved and the granulation looks stable. She sees [name] on 2/9 2/7; patient sees [name] on 2/9. Arrives with a considerable amount of thick callus around this wound the area is essentially unchanged she has been using silver  collagen 2/14; [name] is ordered a CT scan of her foot on 2/22; we are using silver collagen. She had an MRI last year of this foot that did not show osteomyelitis I think that is what he is looking to determine however. Apparently a plain x-ray that was done that was negative 2/28; CT scan of the foot ordered by Dr. Meryl Dare showed advanced neuropathic change about the midfoot. Subluxation of the talus and navicular but no acute bony abnormality. He has been using silver collagen Electronic Signature(s) Signed: 03/16/2020 6:21:54 PM By: Sarah Ham MD Entered By: [name] on 03/16/2020 14:32:14 -------------------------------------------------------------------------------- Physical Exam Details Patient Name: Date of Service: ENO Elige Ko, Layna J. 03/16/2020 1:30 PM Medical Record Number: [id number] Patient Account Number: [id number] Date of Birth/Sex: Treating RN: 02/25/1955 (65 y.o. [name] Primary Care Provider: MA SO [name], [place] MA LSA DA [name] Other Clinician: Referring Provider: Treating Provider/Extender: [name], [name] Weeks in Treatment: 29 Constitutional Sitting or standing Blood Pressure is within target range for patient.. Pulse regular and within target range for patient.Marland Kitchen Respirations regular, non-labored and within target range.. Temperature is normal and within the target range for the patient.Marland Kitchen Appears in no distress. Notes Wound exam; not much change here. Thick rolled edges of callus skin and subcutaneous tissue around the margins of this wound were removed using pickups and a #15 scalpel hemostasis with direct pressure. I used a #5 curette to clean off the surface of the wound of fibrinous debris. Electronic Signature(s) Signed: 03/16/2020 6:21:54 PM By: Sarah Ham MD Entered By: [name] on 03/16/2020 14:33:31 -------------------------------------------------------------------------------- Physician Orders  Details Patient Name: Date of Service: [facility], [place] J. 03/16/2020 1:30 PM Medical Record Number: [id number] Patient Account Number: [id number] Date of Birth/Sex: Treating RN: 18-Mar-1955 (65 y.o. [name] Primary Care Provider: MA SO [name], [place] MA LSA DA [name] Other Clinician: Referring Provider: Treating Provider/Extender: [name], [name] Weeks in Treatment: 24 Verbal / Phone Orders: No Diagnosis Coding ICD-10 Coding Code Description E11.621 Type 2 diabetes mellitus with foot ulcer L97.511 Non-pressure chronic ulcer of other part of right foot limited to breakdown of skin E11.42 Type 2 diabetes mellitus with diabetic polyneuropathy M14.671 Charcot's joint, right ankle and foot Follow-up Appointments Return Appointment in 2 weeks. Bathing/ Shower/ Hygiene May shower with protection but do not get wound dressing(s) wet. Edema Control - Lymphedema / SCD / Other Elevate legs to the level of the heart or above for 30 minutes daily and/or when sitting, a frequency of: - throughout the day Avoid standing for long periods of time. Patient to wear own compression stockings every day. - left leg daily Moisturize legs daily. - left leg daily Wound Treatment Wound #9 - Foot Wound Laterality: Right, Medial Cleanser: Byram Ancillary Kit - 15 Day Supply (DME) (Generic) Every Other Day/15 Days Discharge Instructions: Use supplies as instructed; Kit contains: (15) Saline Bullets; (15) 3x3 Gauze; 15 pr Gloves Cleanser: Soap and Water Every Other  Day/15 Days Discharge Instructions: May shower and wash wound with dial antibacterial soap and water prior to dressing change. Peri-Wound Care: Sween Lotion (Moisturizing lotion) Every Other Day/15 Days Discharge Instructions: Apply moisturizing lotion as directed Prim Dressing: PolyMem Silver Non-Adhesive Dressing, 4.25x4.25 in (DME) (Generic) Every Other Day/15 Days ary Discharge Instructions: Apply to wound bed as  instructed Secondary Dressing: Woven Gauze Sponge, Non-Sterile 4x4 in (DME) (Generic) Every Other Day/15 Days Discharge Instructions: Apply over primary dressing as directed. Secondary Dressing: ABD Pad, 5x9 (DME) Every Other Day/15 Days Discharge Instructions: Apply over primary dressing as directed. Secondary Dressing: Felt 2.5 yds x 5.5 in Every Other Day/15 Days Discharge Instructions: Apply over primary dressing cut to form callous pad around wound Secured With: Kerlix Roll Sterile, 4.5x3.1 (in/yd) (DME) (Generic) Every Other Day/15 Days Discharge Instructions: Secure with Kerlix as directed. Secured With: Paper Tape, 2x10 (in/yd) (DME) (Generic) Every Other Day/15 Days Discharge Instructions: Secure dressing with tape as directed. Electronic Signature(s) Signed: 03/16/2020 5:56:44 PM By: Sarah Hurst RN, BSN Signed: 03/16/2020 6:21:54 PM By: Sarah Ham MD Entered By: [name] on 03/16/2020 14:28:41 -------------------------------------------------------------------------------- Problem List Details Patient Name: Date of Service: [facility], Sharne J. 03/16/2020 1:30 PM Medical Record Number: [id number] Patient Account Number: [id number] Date of Birth/Sex: Treating RN: Jan 11, 1956 (65 y.o. [name] Primary Care Provider: MA SO [name], Willette Pa MA LSA DA [name] Other Clinician: Referring Provider: Treating Provider/Extender: [name], [name] Weeks in Treatment: 75 Active Problems ICD-10 Encounter Code Description Active Date MDM Diagnosis E11.621 Type 2 diabetes mellitus with foot ulcer 01/28/2019 No Yes L97.511 Non-pressure chronic ulcer of other part of right foot limited to breakdown of 01/28/2019 No Yes skin E11.42 Type 2 diabetes mellitus with diabetic polyneuropathy 01/28/2019 No Yes M14.671 Charcot's joint, right ankle and foot 01/28/2019 No Yes Inactive Problems ICD-10 Code Description Active Date Inactive Date L97.524 Non-pressure  chronic ulcer of other part of left foot with necrosis of bone 01/28/2019 01/28/2019 L97.514 Non-pressure chronic ulcer of other part of right foot with necrosis of bone 01/28/2019 01/28/2019 H88.502 Charcot's joint, left ankle and foot 01/28/2019 01/28/2019 D74.128 Other chronic osteomyelitis, left ankle and foot 04/08/2019 04/08/2019 Resolved Problems Electronic Signature(s) Signed: 03/16/2020 6:21:54 PM By: Sarah Ham MD Entered By: [name] on 03/16/2020 14:29:15 -------------------------------------------------------------------------------- Progress Note Details Patient Name: Date of Service: Yolanda Manges, Abbygale J. 03/16/2020 1:30 PM Medical Record Number: [id number] Patient Account Number: [id number] Date of Birth/Sex: Treating RN: 1955/10/05 (65 y.o. [name] Primary Care Provider: MA SO [name], [place] MA LSA DA [name] Other Clinician: Referring Provider: Treating Provider/Extender: [name], [name] Weeks in Treatment: 73 Subjective History of Present Illness (HPI) 65 yrs old bf here for follow up recurrent left charcot foot ulcer. She has DM. She is not a smoker. She states a blister developed 3 months ago at site of previous breakdown from 1 year ago. It was debrided at the podiatrist's office. She went to the ER and then sent here. She denies pain. Xray was negative for osteo. Culture from this clinic negative. 09/08/14 Referred by [name] to [name] for Charcot foot. Seen by him and MRI scheduled 09/19/14. Prior silver collagen but this was not ordered last visit, patient has been rinsing with saline alone. Re institute silver collagen every other day. F/u 2 weeks with [name] as Labor Day holiday. Supplies ordered. 09/25/14; this is a patient with a Charcot foot on  the left. she has a wound over the plantar aspect of her left calcaneus. She has been seen by [name] of orthopedic surgery and has had an MRI. She is apparently going within the  next week or 2 for corrective surgery which will involve an external fixator. I don'[name] have any of the details of this. 10/06/14; The patient is a 65 yrs old bf with a left Charcot foot and ulcer on the plantar. 12/01/14 Returns post surgery with [name]. Has follow up visit later this week with him, currently in facility and NWB over this extremity. States no drainage from foot and no current wound care. On exam callus and scab in place, suspect healed. Has external fixator in place. Will defer to [name] for management, ok to place moisturizing cream over scabs and callus and she will call if she requires follow up here. READMISSION 01/28/2019 Patient was in the clinic in 2016 for a wound on the plantar aspect of her left calcaneus. Predominantly cared for by Dr. Marla [name] she was referred to [name] and had corrective surgery for the position of her left ankle I believe. She had previously been here in 2015 and then prior to that in 2011 2012 that I do not have these records. More recently she has developed fairly extensive wounds on the right medial foot, left lateral foot and a small area on the right medial great toe. Right medial foot wound has been there for 6 to 7 months, left foot wound for 2 to 3 months and the right great toe only over the last few weeks. She has been followed by [name] at Dr. [name] office it sounds as though she has been undergoing callus paring and silver nitrate. The patient has been washing these off with soap and water and plan applying dry dressing Past medical history, type 2 diabetes well controlled with a recent hemoglobin A1c of 7 on 11/16, type 2 diabetes with peripheral neuropathy, previous left Charcot joint surgery bilateral Charcot joints currently, stage III chronic renal failure, hypertension, obstructive sleep apnea, history of MRSA and gastroesophageal reflux. 1/18; this is a patient with bilateral Charcot feet. Plain x-rays that I did  of both of these wounds that are either at bone or precariously close to bone were done. On the right foot this showed possible lytic destruction involving the anterior aspect of the talus which may represent a wound osteomyelitis. An MRI was recommended. On the left there was no definite lytic destruction although the wound is deep undermining's and I think probably requires an MRI on this basis in any case 1/25; patient was supposed to have her MRI last Friday of her bilateral feet however they would not do it because there was silver alginate in her dressings, will replace with calcium alginate today and will see if we can get her done today 2/1; patient did not get her MRIs done last week because of issues with the MRI department receiving orders to do bilateral feet. She has 2 deep wounds in the setting of Charcot feet deformities bilaterally. Both of them at one point had exposed bone although I had trouble demonstrating that today. I am not sure that we can offload these very aggressively as I watched her leave the clinic last week her gait is very narrow and unsteady. 2/15; the MRI of her right foot did not show osteomyelitis potentially osteonecrosis of the talus. However on the left foot there was suggestive findings of osteomyelitis in the  calcaneus. The wounds are a large wound on the right medial foot and on the left a substantial wound on the left lateral plantar calcaneus. We have been using silver alginate. Ultimately I think we need to consider a total contact cast on the right while we work through the possibility of osteomyelitis in the left. I watched her walk out with her crutches I have some trepidation about the ability to walk with the cast on her right foot. Nevertheless with her Charcot deformities I do not think there is a way to heal these short of offloading them aggressively. 2/22; the culture of the bone in the left foot that I did last week showed a few Citrobacter  Koseri as well as some streptococcal species. In my attempted bone for pathology did not actually yield a lot of bone the pathologist did not identify any osteomyelitis. 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 Is promised today and aggressive debridement of the right foot and a total contact cast which surprisingly she seemed to tolerate quite well 03/13/2019 upon evaluation today patient appears to be doing well with her total contact cast she is here for the first cast change. She had no areas of rubbing or any other complications at this point. Overall things are doing quite well. 2/26; the patient was kindly seen by Dr. Megan Salon of infectious disease on 2/25. She is now on IV vancomycin PICC line placement in 2 days and oral Flagyl. This was in response to her MRI showing osteomyelitis of the left calcaneus concerning for osteomyelitis. We have been putting her right leg in a total contact cast. Our nurses report drainage. She is tolerating the total contact cast well. 3/4; the patient is started her IV antibiotics and is taking IV vancomycin and oral Flagyl. She appears to be tolerating these both well. This is for osteomyelitis involving the left calcaneus. The area on the right in the setting of bilateral Charcot deformities did not have any bone infection on MRI. We put a total contact cast on her with silver alginate as the primary dressing and she returns today in follow-up. Per our intake nurse there was too much drainage to leave this on all week therefore we will bring her back for an early change 3/8; follow-up total contact cast she is still having a lot of drainage probably too much drainage from the right foot wound will week with the same cast. She will be back on Thursday. The area on the left looks somewhat better 3/11; patient here  for a total contact cast change. 3/15; patient came in for wound care evaluation. She is still on IV vancomycin and oral Flagyl for osteomyelitis in the left foot. The area on the right foot we are putting in a total contact cast. 3/18 for total contact cast change on the right foot 3/22; the patient was here for wound review today. The area on the right foot is slightly smaller in terms of surface area. However the surface of the wound is very gritty and hyper granulated. More problematically the area on the plantar left foot has increased depth indeed in the center of this it probes to bone. We have been using Hydrofera Blue in both areas. She is on antibiotics as prescribed by infectious disease IV vancomycin and oral Flagyl 3/25; the patient is in for  total contact cast change. Our intake nurse was concerned about the amount of drainage which soaked right to the multiple layers we are putting on this. Wondered about options. The wound bed actually looks as healthy as this is looked although there may not be a lot of change in dimensions things are looking a lot better. If we are going to heal this woman's wound which is in the middle of her right Charcot foot this is going to need to be offloaded. There are not many alternatives 3/29; still not a lot of improvement although the surface of the right wound looks better. We have been using Hydrofera Blue 04/17/2019 upon evaluation today patient appears to be doing well with a total contact cast. With actually having to change this twice a week. She saw [name] for wound care earlier in the week this is for the cast change today. That is due to the fact that she has significant drainage. Overall the wound is been looking excellent however. 4/5; we continue to put a total contact cast on this patient with Hydrofera Blue. Still a lot of drainage which precludes a simple weekly change or changing this twice a week. 4/8; total contact cast changed  today. Apparently the wound looks better per our intake nurse 4/12; patient here for wound care evaluation. Wounds on the right foot have not changed in dimension none about a month left foot looks similar. Apparently her antibiotics that we are giving her for osteomyelitis in the left foot complete on Wednesday. We have been using Hydrofera Blue on the right foot under a total contact cast. Nurses report greenish drainage although I think this may be discoloration from the [facility] 4/15; back for cast change wounds look quite good although still moderate amount of drainage. [name] contact cast reapplied otal 4/19;. Wound evaluation. Wound on the right foot is smaller surface looks healthy. There is still the divot in the middle part of this wound but I could not feel any bone. Area on the left foot also looks better She has completed her antibiotics but still has the PICC line in. 4/23; patient comes back for a total contact cast change this was done in the standard fashion no issues 4/26; wound measures slightly larger on the right foot which is disappointing. She sees Dr. Megan Salon with regards to her antibiotics on Wednesday. I believe she is on vancomycin IV and oral Flagyl. 4/29; in for her routine total contact cast change. She saw Dr. Megan Salon this morning. He is asking about antibiotic continuation I will be in touch with him. I did not look at her wound today 5/3; patient saw Dr. Megan Salon on 4/28. I did send him a note asking about the continuation of perhaps an oral regimen. I have not heard back. She still has an elevated sedimentation rate at 81. Her wounds are smaller. 5/6; in for her total contact cast change. We did not look at the wound today. She has seen Dr. Megan Salon and is now on oral Keflex and Flagyl. She seems to be tolerating these well 5/10; she had her PICC line removed. The wound on the right foot after some initial improvement has not made any improvement even with the  total contact cast. Still having a lot of drainage. Likewise the area on the left foot really is not any better either 5/14; in for a cast change today. We will look at the right foot on Monday. If this is not making any progress I  am going to try to cast the left foot. 5/17; the wound on the right foot is absolutely no different. I am going to put ointment on this area and change the cast of the left foot. We have been using silver alginate there 5/21; apparently the area on the right foot was better per our intake nurse although I did not see it. We did a standard total contact cast change on the left foot we have been applying silver alginate to the wound here. 06/10/19-Area on the right foot per dimensions slightly better, the left foot has still some depth applying silver alginate to the wound with TCC 06/13/19-Patient here for Casting to Left foot 6/1; patient had the total contact cast were placed on the left foot. Noted some swelling in her right leg although the dimensions of the wound on the right foot are better. We have been using PolyMem on the right and silver alginate on the left under the cast 6/7; we continue to make nice progress in both her plantar feet wound. We are putting the total contact cast on the left. Surprisingly in spite of this the area on the right medial foot in the middle of her Charcot deformities actually is making progress as well 07/01/2019 upon evaluation today patient actually appears to be doing quite well with regard to her wounds on the feet compared to when I saw her last that has been quite some time. With that being said I do believe the total contact cast has been beneficial in this left foot and she has a lot of signs of improvement she does have some callus overhanging which is caused a little bit of a ledge and an issue here. I do believe that we may be able to do something to help in that regard. Fortunately otherwise the wound seems to be progressing  quite nicely. 6/21; the patient's area on the left foot that we are currently casting is just about closed. The area on the right medial foot has a healthy looking surface but may be not changed as much from last week in terms of surface area. We are using silver alginate on the right and polymen Ag under the cast 6/28; only a small open area on the left foot remains. Using silver alginate under a total contact cast. She has a diabetic shoe I have asked her to bring that for next week where this may be closed. The area on the right foot with a Charcot deformity is also measuring slightly smaller. We are using PolyMem Ag here 7/6; left foot is totally closed. She does not require a total contact cast. She will graduate to her own diabetic shoe. ooThe area on the right foot with a Charcot deformity measuring about the same. We have been using polymen under compression wraps. She did not do well in this area with a total contact cast 7/13; left foot remains intact. She is skillfully covering this area with foam and using her diabetic shoe. Slight area improvement of the right foot wound. Still rolled senescent looking edges. We have been using polymen 7/19; left foot remains intact. Wound measuring slightly smaller on the right foot in the middle of the Charcot deformity. We have been using PolyMem Ag 7/26; per the patient left foot remains intact. The wound on the right foot looks slightly smaller but dimensions are measuring the same. We have been using polymen Ag 8/9-Patient back after 2 weeks, wound of the right foot again looking slightly smaller,  continuing to use PolyMem silver 8/16; patient has made some minor improvements in wound area on the right foot. Using polymen Ag. The left foot remains healed 8/23; continued slight improvement in dimensions. Rolled edges around the wound edge noted. We have been using polymen Ag. Patient offloading with a surgical shoe and crutches 8/30; no change in  surface area. Thick rolled skin and subcutaneous tissue around the edges debris on the wound surface. We have been using polymen Ag initially with some improvement however stalled on the last 2 weeks in terms of surface area 9/13; surface area is smaller still using polymen Ag. Thick rolled skin and subcutaneous tissue around the edges requiring debridement. 9/20; again surface area is improved using polymen Ag however each week she develops thick rolled skin and subcutaneous tissue around the wound edges requiring debridement. She is offloading this using crutches 9/27; we've been using PolyMem AG. Wound does not look any different from last week. Raised edges of callus skin and subcutaneous tissue around the wound bed. 10/5; we been using polymen Ag. No ability to put a total contact cast on today in the clinic. Wound is about the same still rolled edges of thick callus around the margins 10/18; we put her in a total contact cast last week things did not go well. Not only does the wound looked worse macerated angry with thick callus and some warmth around it. She had another wound above this and one on the anterior right leg. I am not totally certain why this happened. She has not been systemically unwell 10/25; culture from last week grew Pseudomonas and MRSA. My experience with this MRSA is usually a true infection and Pseudomonas a colonizer I am going to put her on doxycycline today we put gentamicin on the wound surface. Continuing with silver alginate 11/1; she has tolerated her antibiotics. I gave her doxycycline. Was still using gentamicin. The original wound looks about the same thick rolled edges. The cast injury just above it is superficial 11/8; even after an aggressive debridement last week the wound on her medial Charcot foot on the right looks about the same. The surface looks satisfactory however rolled edges of callus thick skin and subcutaneous tissue. She did not do well on 2  separate occasions in a total contact cast. The cast abrasion she has on the medial foot is just above closed. We have been using polymen Ag. She is attempting to offload this is much as she can with her crutches 11/15; the wound on the Charcot right foot about the same in terms of surface area.. The area above this on the ankle is healed. 11/29; Charcot right foot wound. This is medially. Wound may be slightly smaller however still raised edges of callus and skin around the wound circumference. We have been using polymen 12/6; Charcot right foot wound. This is medially on the plantar aspect. I did not debride this today wound looks about the same in a week she is reappeared with raised edges of callus and thick skin around the wound. We have been using polymen no real improvement 12/13; again an extensive debridement today. Thick callus skin and subcutaneous tissue around the wound margin with undermining. We have been using silver collagen since last week 12/20; in spite of the debridement last week the wound really looks no difference. Circumferential callus shallow wound granulation does not look too bad. We have been using silver collagen really no improvement 1/10 patient comes in with a wound looking about  the same. Circumferential callus and subcutaneous tissue. I did not debride this today we have been using Sorbact and wrapping her leg. Even putting her in a total contact cast did not help here. She has an appointment with [name] on 2/9. Question of whether a surgical procedure to correct the deformity underneath this area might help. 02/05/2020 upon evaluation today patient actually appears to be stalling to some degree here. Fortunately there is no signs of active infection at this time. She does have a significant callus buildup and therefore before rewrapping the staff wanted me to see if I could see her and remove some of the callus. I was definitely happy to do that today. 1/24;  the patient arrives today with a cleaner looking her wound circumference but with the same rolled edges. Change to silver alginate last time because of drainage. Wound surface looks somewhat better although it precariously close to the underlying bone here. 1/31; she arrives with a continued improvement in the wound circumference less callus less thick skin. I changed her back to silver collagen today as the surface appears to be improved and the granulation looks stable. She sees [name] on 2/9 2/7; patient sees [name] on 2/9. Arrives with a considerable amount of thick callus around this wound the area is essentially unchanged she has been using silver collagen 2/14; [name] is ordered a CT scan of her foot on 2/22; we are using silver collagen. She had an MRI last year of this foot that did not show osteomyelitis I think that is what he is looking to determine however. Apparently a plain x-ray that was done that was negative 2/28; CT scan of the foot ordered by Dr. Meryl Dare showed advanced neuropathic change about the midfoot. Subluxation of the talus and navicular but no acute bony abnormality. He has been using silver collagen Objective Constitutional Sitting or standing Blood Pressure is within target range for patient.. Pulse regular and within target range for patient.Marland Kitchen Respirations regular, non-labored and within target range.. Temperature is normal and within the target range for the patient.Marland Kitchen Appears in no distress. Vitals Time Taken: 1:59 PM, Height: 68 in, Weight: 265 lbs, BMI: 40.3, Temperature: 98.5 F, Pulse: 73 bpm, Respiratory Rate: 20 breaths/min, Blood Pressure: 127/75 mmHg, Capillary Blood Glucose: 129 mg/dl. General Notes: Wound exam; not much change here. Thick rolled edges of callus skin and subcutaneous tissue around the margins of this wound were removed using pickups and a #15 scalpel hemostasis with direct pressure. I used a #5 curette to clean off the surface of  the wound of fibrinous debris. Integumentary (Hair, Skin) Wound #9 status is Open. Original cause of wound was Blister. The date acquired was: 06/18/2018. The wound has been in treatment 59 weeks. The wound is located on the Right,Medial Foot. The wound measures 1.6cm length x 1.5cm width x 1cm depth; 1.885cm^2 area and 1.885cm^3 volume. There is Fat Layer (Subcutaneous Tissue) exposed. There is no tunneling noted, however, there is undermining starting at 12:00 and ending at 12:00 with a maximum distance of 0.9cm. There is a medium amount of serosanguineous drainage noted. The wound margin is thickened. There is large (67-100%) pale granulation within the wound bed. There is no necrotic tissue within the wound bed. 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 There was a Excisional Skin/Subcutaneous Tissue Debridement with a total area of 2.4 sq cm performed by [name]., MD. With the following instrument(s): Blade, and Forceps to remove Viable and Non-Viable tissue/material. Material removed includes Callus, Subcutaneous Tissue, and Skin: Epidermis. No specimens were taken. A time out was conducted at 14:21, prior to the start of the procedure. A Moderate amount of bleeding was controlled with Pressure. The procedure was tolerated well with a pain level of 0 throughout and a pain level of 0 following the procedure. Post Debridement Measurements: 1.6cm length x 1.5cm width x 1cm depth; 1.885cm^3 volume. Character of Wound/Ulcer Post Debridement is improved. Severity of Tissue Post  Debridement is: Fat layer exposed. Post procedure Diagnosis Wound #9: Same as Pre-Procedure Plan Follow-up Appointments: Return Appointment in 2 weeks. Bathing/ Shower/ Hygiene: May shower with protection but do not get wound dressing(s) wet. Edema Control - Lymphedema / SCD / Other: Elevate legs to the level of the heart or above for 30 minutes daily and/or when sitting, a frequency of: - throughout the day Avoid standing for long periods of time. Patient to wear own compression stockings every day. - left leg daily Moisturize legs daily. - left leg daily WOUND #9: - Foot Wound Laterality: Right, Medial Cleanser: Byram Ancillary Kit - 15 Day Supply (DME) (Generic) Every Other Day/15 Days Discharge Instructions: Use supplies as instructed; Kit contains: (15) Saline Bullets; (15) 3x3 Gauze; 15 pr Gloves Cleanser: Soap and Water Every Other Day/15 Days Discharge Instructions: May shower and wash wound with dial antibacterial soap and water prior to dressing change. Peri-Wound Care: Sween Lotion (Moisturizing lotion) Every Other Day/15 Days Discharge Instructions: Apply moisturizing lotion as directed Prim Dressing: PolyMem Silver Non-Adhesive Dressing, 4.25x4.25 in (DME) (Generic) Every Other Day/15 Days ary Discharge Instructions: Apply to wound bed as instructed Secondary Dressing: Woven Gauze Sponge, Non-Sterile 4x4 in (DME) (Generic) Every Other Day/15 Days Discharge Instructions: Apply over primary dressing as directed. Secondary Dressing: ABD Pad, 5x9 (DME) Every Other Day/15 Days Discharge Instructions: Apply over primary dressing as directed. Secondary Dressing: Felt 2.5 yds x 5.5 in Every Other Day/15 Days Discharge Instructions: Apply over primary dressing cut to form callous pad around wound Secured With: Kerlix Roll Sterile, 4.5x3.1 (in/yd) (DME) (Generic) Every Other Day/15 Days Discharge Instructions: Secure with Kerlix as directed. Secured With: Paper [name] ape, 2x10 (in/yd)  (DME) (Generic) Every Other Day/15 Days Discharge Instructions: Secure dressing with tape as directed. 1. I change the primary dressing back to polymen Ag. 2. Previously this type of debridement and change in dressing has not worked although I would like to reprove this to myself 3. Her CT scan was negative for osteomyelitis. We will see with [name] think she is eligible for any surgery that might help with healing this area Electronic Signature(s) Signed: 03/16/2020 6:21:54 PM By: Sarah Ham MD Entered By: [name] on 03/16/2020 14:36:19 -------------------------------------------------------------------------------- SuperBill Details Patient Name: Date of Service: Yolanda Manges, Malita J. 03/16/2020 Medical Record Number: [id number] Patient Account Number: [id number] Date of Birth/Sex: Treating RN: [birth year] (65 y.o. [name] Primary Care Provider: MA Particia Lather MA LSA DA [name] Other Clinician: Referring Provider: Treating Provider/Extender: [name], [name] Weeks in Treatment: 42 Diagnosis Coding ICD-10 Codes Code Description E11.621 Type 2 diabetes mellitus with foot  ulcer L97.511 Non-pressure chronic ulcer of other part of right foot limited to breakdown of skin E11.42 Type 2 diabetes mellitus with diabetic polyneuropathy M14.671 Charcot's joint, right ankle and foot Facility Procedures CPT4 Code: [id number] Description: 96283 - DEB SUBQ TISSUE 20 SQ CM/< ICD-10 Diagnosis Description L97.511 Non-pressure chronic ulcer of other part of right foot limited to breakdown of E11.621 Type 2 diabetes mellitus with foot ulcer Modifier: skin Quantity: 1 Physician Procedures : CPT4 Code Description Modifier [id number] - WC PHYS SUBQ TISS 20 SQ CM ICD-10 Diagnosis Description L97.511 Non-pressure chronic ulcer of other part of right foot limited to breakdown of skin E11.621 Type 2 diabetes mellitus with foot ulcer Quantity: 1 Electronic  Signature(s) Signed: 03/16/2020 6:21:54 PM By: Sarah Ham MD Entered By: [name] on 03/16/2020 14:41:11

## 2020-03-17 NOTE — Progress Notes (Signed)
[name], [name] ([id number]) Visit Report for 03/16/2020 Arrival Information Details Patient Name: Date of Service: [name], [name] 03/16/2020 1:30 PM Medical Record Number: [id number] Patient Account Number: [id number] Date of Birth/Sex: Treating RN: [birth year] (65 y.o. [name] Primary Care Provider: MA SO UDI, [place] MA LSA DA T Other Clinician: Referring Provider: Treating Provider/Extender: Linton Ham MA SO UDI, ELHA MA LSA DA T Weeks in Treatment: 36 Visit Information History Since Last Visit Added or deleted any medications: No Patient Arrived: Crutches Any new allergies or adverse reactions: No Arrival Time: 13:55 Had a fall or experienced change in No Accompanied By: self activities of daily living that may affect Transfer Assistance: None risk of falls: Patient Requires Transmission-Based Precautions: No Signs or symptoms of abuse/neglect since last visito No Patient Has Alerts: No Hospitalized since last visit: No Implantable device outside of the clinic excluding No cellular tissue based products placed in the [name] since last visit: Has Dressing in Place as Prescribed: Yes Pain Present Now: No Electronic Signature(s) Signed: 03/17/2020 8:29:17 AM By: Sandre Kitty Entered By: Sandre Kitty on 03/16/2020 13:59:10 -------------------------------------------------------------------------------- Encounter Discharge Information Details Patient Name: Date of Service: [name], [name] 03/16/2020 1:30 PM Medical Record Number: [id number] Patient Account Number: [id number] Date of Birth/Sex: Treating RN: [birth year] (65 y.o. [name] Primary Care Provider: MA SO UDI, Willette Pa MA LSA DA T Other Clinician: Referring Provider: Treating Provider/Extender: Linton Ham MA SO UDI, ELHA MA LSA DA T Weeks in Treatment: 28 Encounter Discharge Information Items Post Procedure Vitals Discharge Condition: Stable Temperature (F): 98.5 Ambulatory  Status: Crutches Pulse (bpm): 73 Discharge Destination: Home Respiratory Rate (breaths/min): 20 Transportation: Private Auto Blood Pressure (mmHg): 127/75 Accompanied By: self Schedule Follow-up Appointment: Yes Clinical Summary of Care: Electronic Signature(s) Signed: 03/16/2020 5:39:38 PM By: Deon Pilling Entered By: Deon Pilling on 03/16/2020 14:46:55 -------------------------------------------------------------------------------- Lower Extremity Assessment Details Patient Name: Date of Service: BIRTTANY, DECHELLIS. 03/16/2020 1:30 PM Medical Record Number: [id number] Patient Account Number: [id number] Date of Birth/Sex: Treating RN: February 25, 1955 (65 y.o. [name] Primary Care Provider: MA SO UDI, [place] MA LSA DA T Other Clinician: Referring Provider: Treating Provider/Extender: Linton Ham MA SO UDI, ELHA MA LSA DA T Weeks in Treatment: 29 Edema Assessment Assessed: [Left: No] [Right: No] Edema: [Left: N] [Right: o] Calf Left: Right: Point of Measurement: 43 cm From Medial Instep 32 cm Ankle Left: Right: Point of Measurement: 12 cm From Medial Instep 21 cm Vascular Assessment Pulses: Dorsalis Pedis Palpable: [Right:Yes] Electronic Signature(s) Signed: 03/16/2020 5:56:44 PM By: Levan Hurst RN, BSN Entered By: Levan Hurst on 03/16/2020 14:12:49 -------------------------------------------------------------------------------- Multi Wound Chart Details Patient Name: Date of Service: [name], [name] 03/16/2020 1:30 PM Medical Record Number: [id number] Patient Account Number: [id number] Date of Birth/Sex: Treating RN: Dec 03, 1955 (65 y.o. [name] Primary Care Provider: MA SO UDI, Willette Pa MA LSA DA T Other Clinician: Referring Provider: Treating Provider/Extender: Linton Ham MA SO UDI, ELHA MA LSA DA T Weeks in Treatment: 66 Vital Signs Height(in): 68 Capillary Blood Glucose(mg/dl): 129 Weight(lbs): 265 Pulse(bpm): 24 Body Mass  Index(BMI): 40 Blood Pressure(mmHg): 127/75 Temperature(F): 98.5 Respiratory Rate(breaths/min): 20 Photos: [9:No Photos Right, Medial Foot] [N/A:N/A N/A] Wound Location: [9:Blister] [N/A:N/A] Wounding Event: [9:Diabetic Wound/Ulcer of the Lower] [N/A:N/A] Primary Etiology: [9:Extremity Glaucoma, Chronic sinus] [N/A:N/A] Comorbid History: [9:problems/congestion, Anemia, Sleep Apnea, Hypertension, Type II Diabetes, Rheumatoid Arthritis, Neuropathy 06/18/2018] [N/A:N/A] Date Acquired: [9:59] [N/A:N/A] Weeks of Treatment: [9:Open] [N/A:N/A] Wound Status: [9:1.6x1.5x1] [N/A:N/A] Measurements L x W  x D (cm) [9:1.885] [N/A:N/A] A (cm) : rea [9:1.885] [N/A:N/A] Volume (cm) : [9:82.90%] [N/A:N/A] % Reduction in A rea: [9:81.00%] [N/A:N/A] % Reduction in Volume: [9:12] Starting Position 1 (o'clock): [9:12] Ending Position 1 (o'clock): [9:0.9] Maximum Distance 1 (cm): [9:Yes] [N/A:N/A] Undermining: [9:Grade 2] [N/A:N/A] Classification: [9:Medium] [N/A:N/A] Exudate A mount: [9:Serosanguineous] [N/A:N/A] Exudate Type: [9:red, brown] [N/A:N/A] Exudate Color: [9:Thickened] [N/A:N/A] Wound Margin: [9:Large (67-100%)] [N/A:N/A] Granulation A mount: [9:Pale] [N/A:N/A] Granulation Quality: [9:None Present (0%)] [N/A:N/A] Necrotic A mount: [9:Fat Layer (Subcutaneous Tissue): Yes N/A] Exposed Structures: [9:Fascia: No Tendon: No Muscle: No Joint: No Bone: No Small (1-33%)] [N/A:N/A] Epithelialization: [9:Debridement - Excisional] [N/A:N/A] Debridement: Pre-procedure Verification/Time Out 14:21 [N/A:N/A] Taken: [9:Callus, Subcutaneous] [N/A:N/A] Tissue Debrided: [9:Skin/Subcutaneous Tissue] [N/A:N/A] Level: [9:2.4] [N/A:N/A] Debridement A (sq cm): [9:rea Blade, Forceps] [N/A:N/A] Instrument: [9:Moderate] [N/A:N/A] Bleeding: [9:Pressure] [N/A:N/A] Hemostasis A chieved: [9:0] [N/A:N/A] Procedural Pain: [9:0] [N/A:N/A] Post Procedural Pain: [9:Procedure was tolerated well] [N/A:N/A] Debridement  Treatment Response: [9:1.6x1.5x1] [N/A:N/A] Post Debridement Measurements L x W x D (cm) [9:1.885] [N/A:N/A] Post Debridement Volume: (cm) [9:Debridement] [N/A:N/A] Treatment Notes Electronic Signature(s) Signed: 03/16/2020 5:56:44 PM By: Levan Hurst RN, BSN Signed: 03/16/2020 6:21:54 PM By: Linton Ham MD Entered By: Linton Ham on 03/16/2020 14:29:23 -------------------------------------------------------------------------------- Multi-Disciplinary Care Plan Details Patient Name: Date of Service: [name], [name] 03/16/2020 1:30 PM Medical Record Number: [id number] Patient Account Number: [id number] Date of Birth/Sex: Treating RN: 06/07/1955 (65 y.o. [name] Primary Care Provider: MA SO UDI, Willette Pa MA LSA DA T Other Clinician: Referring Provider: Treating Provider/Extender: Linton Ham MA SO UDI, ELHA MA LSA DA T Weeks in Treatment: 14 [place] reviewed with physician Active Inactive Wound/Skin Impairment Nursing Diagnoses: Impaired tissue integrity Knowledge deficit related to ulceration/compromised skin integrity Goals: Patient/caregiver will verbalize understanding of skin care regimen Date Initiated: 01/28/2019 Target Resolution Date: 03/27/2020 Goal Status: Active Interventions: Assess patient/caregiver ability to obtain necessary supplies Assess patient/caregiver ability to perform ulcer/skin care regimen upon admission and as needed Assess ulceration(s) every visit Provide education on ulcer and skin care Notes: Electronic Signature(s) Signed: 03/16/2020 5:56:44 PM By: Levan Hurst RN, BSN Entered By: Levan Hurst on 03/16/2020 17:22:40 -------------------------------------------------------------------------------- Pain Assessment Details Patient Name: Date of Service: [name], [name] 03/16/2020 1:30 PM Medical Record Number: [id number] Patient Account Number: [id number] Date of Birth/Sex: Treating  RN: 10-Aug-1955 (65 y.o. [name] Primary Care Provider: MA SO UDI, [place] MA LSA DA T Other Clinician: Referring Provider: Treating Provider/Extender: Linton Ham MA SO UDI, ELHA MA LSA DA T Weeks in Treatment: 62 Active Problems Location of Pain Severity and Description of Pain Patient Has Paino No Site Locations Pain Management and Medication Current Pain Management: Electronic Signature(s) Signed: 03/16/2020 5:56:44 PM By: Levan Hurst RN, BSN Signed: 03/17/2020 8:29:17 AM By: Sandre Kitty Entered By: Sandre Kitty on 03/16/2020 13:59:38 -------------------------------------------------------------------------------- Patient/Caregiver Education Details Patient Name: Date of Service: Addison Lank 2/28/2022andnbsp1:30 PM Medical Record Number: [id number] Patient Account Number: [id number] Date of Birth/Gender: Treating RN: 1955-02-06 (65 y.o. [name] Primary Care Physician: MA SO UDI, Willette Pa MA LSA DA T Other Clinician: Referring Physician: Treating Physician/Extender: Linton Ham MA SO UDI, ELHA MA LSA DA T Weeks in Treatment: 71 Education Assessment Education Provided To: Patient Education Topics Provided Wound/Skin Impairment: Methods: Explain/Verbal Responses: State content correctly [employer]) Signed: 03/16/2020 5:56:44 PM By: Levan Hurst RN, BSN Entered By: Levan Hurst on 03/16/2020 17:22:51 -------------------------------------------------------------------------------- Wound Assessment Details Patient Name: Date of Service: [name], [name] 03/16/2020 1:30 PM  Medical Record Number: [id number] Patient Account Number: [id number] Date of Birth/Sex: Treating RN: Jun 08, 1955 (65 y.o. [name] Primary Care Provider: MA SO UDI, [place] MA LSA DA T Other Clinician: Referring Provider: Treating Provider/Extender: Linton Ham MA SO UDI, ELHA MA LSA DA T Weeks in Treatment: 65 Wound Status Wound  Number: 9 Primary Diabetic Wound/Ulcer of the Lower Extremity Etiology: Wound Location: Right, Medial Foot Wound Open Wounding Event: Blister Status: Date Acquired: 06/18/2018 Comorbid Glaucoma, Chronic sinus problems/congestion, Anemia, Sleep Weeks Of Treatment: 59 History: Apnea, Hypertension, Type II Diabetes, Rheumatoid Arthritis, Clustered Wound: No Neuropathy Photos Wound Measurements Length: (cm) 1.6 Width: (cm) 1.5 Depth: (cm) 1 Area: (cm) 1.885 Volume: (cm) 1.885 Wound Description Classification: Grade 2 Wound Margin: Thickened Exudate Amount: Medium Exudate Type: Serosanguineous Exudate Color: red, brown Foul Odor After Cleansing: No Slough/Fibrino No % Reduction in Area: 82.9% % Reduction in Volume: 81% Epithelialization: Small (1-33%) Tunneling: No Undermining: Yes Starting Position (o'clock): 12 Ending Position (o'clock): 12 Maximum Distance: (cm) 0.9 Wound Bed Granulation Amount: Large (67-100%) Exposed Structure Granulation Quality: Pale Fascia Exposed: No Necrotic Amount: None Present (0%) Fat Layer (Subcutaneous Tissue) Exposed: Yes Tendon Exposed: No Muscle Exposed: No Joint Exposed: No Bone Exposed: No Treatment Notes Wound #9 (Foot) Wound Laterality: Right, Medial Cleanser Byram Ancillary Kit - 15 Day Supply Discharge Instruction: Use supplies as instructed; Kit contains: (15) Saline Bullets; (15) 3x3 Gauze; 15 pr Gloves Soap and Water Discharge Instruction: May shower and wash wound with dial antibacterial soap and water prior to dressing change. Peri-Wound Care Sween Lotion (Moisturizing lotion) Discharge Instruction: Apply moisturizing lotion as directed Topical Primary Dressing PolyMem Silver Non-Adhesive Dressing, 4.25x4.25 in Discharge Instruction: Apply to wound bed as instructed Secondary Dressing Woven Gauze Sponge, Non-Sterile 4x4 in Discharge Instruction: Apply over primary dressing as directed. ABD Pad, 5x9 Discharge  Instruction: Apply over primary dressing as directed. Felt 2.5 yds x 5.5 in Discharge Instruction: Apply over primary dressing cut to form callous pad around wound Secured With Kerlix Roll Sterile, 4.5x3.1 (in/yd) Discharge Instruction: Secure with Kerlix as directed. Paper Tape, 2x10 (in/yd) Discharge Instruction: Secure dressing with tape as directed. Compression Wrap Compression Stockings Add-Ons Electronic Signature(s) Signed: 03/16/2020 5:56:44 PM By: Levan Hurst RN, BSN Signed: 03/17/2020 8:29:17 AM By: Sandre Kitty Entered By: Sandre Kitty on 03/16/2020 16:59:31 -------------------------------------------------------------------------------- Vitals Details Patient Name: Date of Service: [name], [name] 03/16/2020 1:30 PM Medical Record Number: [id number] Patient Account Number: [id number] Date of Birth/Sex: Treating RN: 06-12-1955 (65 y.o. [name] Primary Care Provider: MA SO UDI, [place] MA LSA DA T Other Clinician: Referring Provider: Treating Provider/Extender: Linton Ham MA SO UDI, ELHA MA LSA DA T Weeks in Treatment: 23 Vital Signs Time Taken: 13:59 Temperature (F): 98.5 Height (in): 68 Pulse (bpm): 73 Weight (lbs): 265 Respiratory Rate (breaths/min): 20 Body Mass Index (BMI): 40.3 Blood Pressure (mmHg): 127/75 Capillary Blood Glucose (mg/dl): 129 Reference Range: 80 - 120 mg / dl Electronic Signature(s) Signed: 03/17/2020 8:29:17 AM By: Sandre Kitty Entered By: Sandre Kitty on 03/16/2020 13:59:32

## 2020-03-23 ENCOUNTER — Encounter (HOSPITAL_BASED_OUTPATIENT_CLINIC_OR_DEPARTMENT_OTHER): Payer: Medicare Other | Admitting: Internal Medicine

## 2020-03-30 ENCOUNTER — Encounter (HOSPITAL_BASED_OUTPATIENT_CLINIC_OR_DEPARTMENT_OTHER): Payer: Medicare Other | Attending: Internal Medicine | Admitting: Internal Medicine

## 2020-03-30 ENCOUNTER — Other Ambulatory Visit: Payer: Self-pay

## 2020-03-30 DIAGNOSIS — I129 Hypertensive chronic kidney disease with stage 1 through stage 4 chronic kidney disease, or unspecified chronic kidney disease: Secondary | ICD-10-CM | POA: Diagnosis not present

## 2020-03-30 DIAGNOSIS — M069 Rheumatoid arthritis, unspecified: Secondary | ICD-10-CM | POA: Insufficient documentation

## 2020-03-30 DIAGNOSIS — E11610 Type 2 diabetes mellitus with diabetic neuropathic arthropathy: Secondary | ICD-10-CM | POA: Diagnosis not present

## 2020-03-30 DIAGNOSIS — E11621 Type 2 diabetes mellitus with foot ulcer: Secondary | ICD-10-CM | POA: Diagnosis present

## 2020-03-30 DIAGNOSIS — E1142 Type 2 diabetes mellitus with diabetic polyneuropathy: Secondary | ICD-10-CM | POA: Diagnosis not present

## 2020-03-30 DIAGNOSIS — N183 Chronic kidney disease, stage 3 unspecified: Secondary | ICD-10-CM | POA: Insufficient documentation

## 2020-03-30 DIAGNOSIS — E1151 Type 2 diabetes mellitus with diabetic peripheral angiopathy without gangrene: Secondary | ICD-10-CM | POA: Insufficient documentation

## 2020-03-30 DIAGNOSIS — E1122 Type 2 diabetes mellitus with diabetic chronic kidney disease: Secondary | ICD-10-CM | POA: Diagnosis not present

## 2020-03-30 DIAGNOSIS — L97511 Non-pressure chronic ulcer of other part of right foot limited to breakdown of skin: Secondary | ICD-10-CM | POA: Diagnosis not present

## 2020-03-30 NOTE — Progress Notes (Signed)
Sarah, [name] ([id number]) Visit Report for 03/30/2020 HPI Details Patient Name: Date of Service: [name], [name] 03/30/2020 1:45 PM Medical Record Number: [id number] Patient Account Number: [id number] Date of Birth/Sex: Treating RN: 1955-11-14 (65 y.o. [name] Primary Care Provider: MA SO UDI, [place] MA LSA DA T Other Clinician: Referring Provider: Treating Provider/Extender: Linton Ham MA SO UDI, ELHA MA LSA DA T Weeks in Treatment: 16 History of Present Illness HPI Description: 65 yrs old bf here for follow up recurrent left charcot foot ulcer. She has DM. She is not a smoker. She states a blister developed 3 months ago at site of previous breakdown from 1 year ago. It was debrided at the podiatrist's office. She went to the ER and then sent here. She denies pain. Xray was negative for osteo. Culture from this clinic negative. 09/08/14 [name] to [name] for Charcot foot. Seen by him and MRI scheduled 09/19/14. Prior silver collagen but this was not ordered last visit, patient has been rinsing with saline alone. Re institute silver collagen every other day. F/u 2 weeks with Dr. Dellia Nims as Labor Day holiday. Supplies ordered. 09/25/14; this is a patient with a Charcot foot on the left. she has a wound over the plantar aspect of her left calcaneus. She has been seen by [name] of orthopedic surgery and has had an MRI. She is apparently going within the next week or 2 for corrective surgery which will involve an external fixator. I don't have any of the details of this. 10/06/14; The patient is a 65 yrs old bf with a left Charcot foot and ulcer on the plantar. 12/01/14 Returns post surgery with [name]. Has follow up visit later this week with him, currently in facility and [facility] over this extremity. States no drainage from foot and no current wound care. On exam callus and scab in place, suspect healed. Has external fixator in place. Will defer to Dr.  [name] for management, ok to place moisturizing cream over scabs and callus and she will call if she requires follow up here. READMISSION 01/28/2019 Patient was in the clinic in 2016 for a wound on the plantar aspect of her left calcaneus. Predominantly cared for by Dr. [name] she was referred to [name] and had corrective surgery for the position of her left ankle I believe. She had previously been here in 2015 and then prior to that in 2011 2012 that I do not have these records. More recently she has developed fairly extensive wounds on the right medial foot, left lateral foot and a small area on the right medial great toe. Right medial foot wound has been there for 6 to 7 months, left foot wound for 2 to 3 months and the right great toe only over the last few weeks. She has been followed by [name] at Dr. [name] office it sounds as though she has been undergoing callus paring and silver nitrate. The patient has been washing these off with soap and water and plan applying dry dressing Past medical history, type 2 diabetes well controlled with a recent hemoglobin A1c of 7 on 11/16, type 2 diabetes with peripheral neuropathy, previous left Charcot joint surgery bilateral Charcot joints currently, stage III chronic renal failure, hypertension, obstructive sleep apnea, history of MRSA and gastroesophageal reflux. 1/18; this is a patient with bilateral Charcot feet. Plain x-rays that I did of both of these wounds that are either at bone or precariously close to bone  were done. On the right foot this showed possible lytic destruction involving the anterior aspect of the talus which may represent a wound osteomyelitis. An MRI was recommended. On the left there was no definite lytic destruction although the wound is deep undermining's and I think probably requires an MRI on this basis in any case 1/25; patient was supposed to have her MRI last Friday of her bilateral feet however they would  not do it because there was silver alginate in her dressings, will replace with calcium alginate today and will see if we can get her done today 2/1; patient did not get her MRIs done last week because of issues with the MRI department receiving orders to do bilateral feet. She has 2 deep wounds in the setting of Charcot feet deformities bilaterally. Both of them at one point had exposed bone although I had trouble demonstrating that today. I am not sure that we can offload these very aggressively as I watched her leave the clinic last week her gait is very narrow and unsteady. 2/15; the MRI of her right foot did not show osteomyelitis potentially osteonecrosis of the talus. 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 The wounds are a large wound on the right medial foot and on the left a substantial wound on the left lateral plantar calcaneus. We have been using silver alginate. Ultimately I think we need to consider a total contact cast on the right while we work through the possibility of osteomyelitis in the left. I watched her walk out with her crutches I have some trepidation about the ability to walk with the cast on her right foot. Nevertheless with her Charcot deformities I do not think there is a way to heal these short of offloading them aggressively. 2/22; the culture of the bone in the left foot that I did last week showed a few Citrobacter Koseri as well as some streptococcal species. In my attempted bone for pathology did not actually yield a lot of bone the pathologist did not identify any osteomyelitis. 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 Is promised today and aggressive debridement of the right foot and a total contact cast which surprisingly she seemed to tolerate quite well 03/13/2019  upon evaluation today patient appears to be doing well with her total contact cast she is here for the first cast change. She had no areas of rubbing or any other complications at this point. Overall things are doing quite well. 2/26; the patient was kindly seen by Dr. Megan Salon of infectious disease on 2/25. She is now on IV vancomycin PICC line placement in 2 days and oral Flagyl. This was in response to her MRI showing osteomyelitis of the left calcaneus concerning for osteomyelitis. We have been putting her right leg in a total contact cast. Our nurses report drainage. She is tolerating the total contact cast well. 3/4; the patient is started her IV antibiotics and is taking IV vancomycin and oral Flagyl. She appears to be tolerating these both well. This is for osteomyelitis involving the left calcaneus. The area on the right in the setting of bilateral Charcot deformities did not have any bone infection on MRI. We put a total contact cast on her with silver alginate as the primary dressing and she returns today in follow-up. Per  our intake nurse there was too much drainage to leave this on all week therefore we will bring her back for an early change 3/8; follow-up total contact cast she is still having a lot of drainage probably too much drainage from the right foot wound will week with the same cast. She will be back on Thursday. The area on the left looks somewhat better 3/11; patient here for a total contact cast change. 3/15; patient came in for wound care evaluation. She is still on IV vancomycin and oral Flagyl for osteomyelitis in the left foot. The area on the right foot we are putting in a total contact cast. 3/18 for total contact cast change on the right foot 3/22; the patient was here for wound review today. The area on the right foot is slightly smaller in terms of surface area. However the surface of the wound is very gritty and hyper granulated. More problematically the area on  the plantar left foot has increased depth indeed in the center of this it probes to bone. We have been using Hydrofera Blue in both areas. She is on antibiotics as prescribed by infectious disease IV vancomycin and oral Flagyl 3/25; the patient is in for total contact cast change. Our intake nurse was concerned about the amount of drainage which soaked right to the multiple layers we are putting on this. Wondered about options. The wound bed actually looks as healthy as this is looked although there may not be a lot of change in dimensions things are looking a lot better. If we are going to heal this woman's wound which is in the middle of her right Charcot foot this is going to need to be offloaded. There are not many alternatives 3/29; still not a lot of improvement although the surface of the right wound looks better. We have been using Hydrofera Blue 04/17/2019 upon evaluation today patient appears to be doing well with a total contact cast. With actually having to change this twice a week. She saw Dr. Dellia Nims for wound care earlier in the week this is for the cast change today. That is due to the fact that she has significant drainage. Overall the wound is been looking excellent however. 4/5; we continue to put a total contact cast on this patient with Hydrofera Blue. Still a lot of drainage which precludes a simple weekly change or changing this twice a week. 4/8; total contact cast changed today. Apparently the wound looks better per our intake nurse 4/12; patient here for wound care evaluation. Wounds on the right foot have not changed in dimension none about a month left foot looks similar. Apparently her antibiotics that we are giving her for osteomyelitis in the left foot complete on Wednesday. We have been using Hydrofera Blue on the right foot under a total contact cast. Nurses report greenish drainage although I think this may be discoloration from the [name] 4/15; back for cast  change wounds look quite good although still moderate amount of drainage. T contact cast reapplied otal 4/19;. Wound evaluation. Wound on the right foot is smaller surface looks healthy. There is still the divot in the middle part of this wound but I could not feel any bone. Area on the left foot also looks better She has completed her antibiotics but still has the PICC line in. 4/23; patient comes back for a total contact cast change this was done in the standard fashion no issues 4/26; wound measures slightly larger on the right  foot which is disappointing. She sees Dr. Megan Salon with regards to her antibiotics on Wednesday. I believe she is on vancomycin IV and oral Flagyl. 4/29; in for her routine total contact cast change. She saw Dr. Megan Salon this morning. He is asking about antibiotic continuation I will be in touch with him. I did not look at her wound today 5/3; patient saw Dr. Megan Salon on 4/28. I did send him a note asking about the continuation of perhaps an oral regimen. I have not heard back. She still has an elevated sedimentation rate at 81. Her wounds are smaller. 5/6; in for her total contact cast change. We did not look at the wound today. She has seen Dr. Megan Salon and is now on oral Keflex and Flagyl. She seems to be tolerating these well 5/10; she had her PICC line removed. The wound on the right foot after some initial improvement has not made any improvement even with the total contact cast. Still having a lot of drainage. Likewise the area on the left foot really is not any better either 5/14; in for a cast change today. We will look at the right foot on Monday. If this is not making any progress I am going to try to cast the left foot. 5/17; the wound on the right foot is absolutely no different. I am going to put ointment on this area and change the cast of the left foot. We have been using silver alginate there 5/21; apparently the area on the right foot was better per our  intake nurse although I did not see it. We did a standard total contact cast change on the left foot we have been applying silver alginate to the wound here. 06/10/19-Area on the right foot per dimensions slightly better, the left foot has still some depth applying silver alginate to the wound with TCC 06/13/19-Patient here for Casting to Left foot 6/1; patient had the total contact cast were placed on the left foot. Noted some swelling in her right leg although the dimensions of the wound on the right foot are better. We have been using PolyMem on the right and silver alginate on the left under the cast 6/7; we continue to make nice progress in both her plantar feet wound. We are putting the total contact cast on the left. Surprisingly in spite of this the area on the right medial foot in the middle of her Charcot deformities actually is making progress as well 07/01/2019 upon evaluation today patient actually appears to be doing quite well with regard to her wounds on the feet compared to when I saw her last that has been quite some time. With that being said I do believe the total contact cast has been beneficial in this left foot and she has a lot of signs of improvement she does have some callus overhanging which is caused a little bit of a ledge and an issue here. I do believe that we may be able to do something to help in that regard. Fortunately otherwise the wound seems to be progressing quite nicely. 6/21; the patient's area on the left foot that we are currently casting is just about closed. The area on the right medial foot has a healthy looking surface but may be not changed as much from last week in terms of surface area. We are using silver alginate on the right and polymen Ag under the cast 6/28; only a small open area on the left foot remains. Using silver alginate  under a total contact cast. She has a diabetic shoe I have asked her to bring that for next week where this may be closed.  The area on the right foot with a Charcot deformity is also measuring slightly smaller. We are using PolyMem Ag here 7/6; left foot is totally closed. She does not require a total contact cast. She will graduate to her own diabetic shoe. The area on the right foot with a Charcot deformity measuring about the same. We have been using polymen under compression wraps. She did not do well in this area with a total contact cast 7/13; left foot remains intact. She is skillfully covering this area with foam and using her diabetic shoe. Slight area improvement of the right foot wound. Still rolled senescent looking edges. We have been using polymen 7/19; left foot remains intact. Wound measuring slightly smaller on the right foot in the middle of the Charcot deformity. We have been using PolyMem Ag 7/26; per the patient left foot remains intact. The wound on the right foot looks slightly smaller but dimensions are measuring the same. We have been using polymen Ag 8/9-Patient back after 2 weeks, wound of the right foot again looking slightly smaller, continuing to use PolyMem silver 8/16; patient has made some minor improvements in wound area on the right foot. Using polymen Ag. The left foot remains healed 8/23; continued slight improvement in dimensions. Rolled edges around the wound edge noted. We have been using polymen Ag. Patient offloading with a surgical shoe and crutches 8/30; no change in surface area. Thick rolled skin and subcutaneous tissue around the edges debris on the wound surface. We have been using polymen Ag initially with some improvement however stalled on the last 2 weeks in terms of surface area 9/13; surface area is smaller still using polymen Ag. Thick rolled skin and subcutaneous tissue around the edges requiring debridement. 9/20; again surface area is improved using polymen Ag however each week she develops thick rolled skin and subcutaneous tissue around the wound  edges requiring debridement. She is offloading this using crutches 9/27; we've been using PolyMem AG. Wound does not look any different from last week. Raised edges of callus skin and subcutaneous tissue around the wound bed. 10/5; we been using polymen Ag. No ability to put a total contact cast on today in the clinic. Wound is about the same still rolled edges of thick callus around the margins 10/18; we put her in a total contact cast last week things did not go well. Not only does the wound looked worse macerated angry with thick callus and some warmth around it. She had another wound above this and one on the anterior right leg. I am not totally certain why this happened. She has not been systemically unwell 10/25; culture from last week grew Pseudomonas and MRSA. My experience with this MRSA is usually a true infection and Pseudomonas a colonizer I am going to put her on doxycycline today we put gentamicin on the wound surface. Continuing with silver alginate 11/1; she has tolerated her antibiotics. I gave her doxycycline. Was still using gentamicin. The original wound looks about the same thick rolled edges. The cast injury just above it is superficial 11/8; even after an aggressive debridement last week the wound on her medial Charcot foot on the right looks about the same. The surface looks satisfactory however rolled edges of callus thick skin and subcutaneous tissue. She did not do well on 2 separate occasions in a  total contact cast. The cast abrasion she has on the medial foot is just above closed. We have been using polymen Ag. She is attempting to offload this is much as she can with her crutches 11/15; the wound on the Charcot right foot about the same in terms of surface area.. The area above this on the ankle is healed. 11/29; Charcot right foot wound. This is medially. Wound may be slightly smaller however still raised edges of callus and skin around the wound circumference. We  have been using polymen 12/6; Charcot right foot wound. This is medially on the plantar aspect. I did not debride this today wound looks about the same in a week she is reappeared with raised edges of callus and thick skin around the wound. We have been using polymen no real improvement 12/13; again an extensive debridement today. Thick callus skin and subcutaneous tissue around the wound margin with undermining. We have been using silver collagen since last week 12/20; in spite of the debridement last week the wound really looks no difference. Circumferential callus shallow wound granulation does not look too bad. We have been using silver collagen really no improvement 1/10 patient comes in with a wound looking about the same. Circumferential callus and subcutaneous tissue. I did not debride this today we have been using Sorbact and wrapping her leg. Even putting her in a total contact cast did not help here. She has an appointment with [name] on 2/9. Question of whether a surgical procedure to correct the deformity underneath this area might help. 02/05/2020 upon evaluation today patient actually appears to be stalling to some degree here. Fortunately there is no signs of active infection at this time. She does have a significant callus buildup and therefore before rewrapping the staff wanted me to see if I could see her and remove some of the callus. I was definitely happy to do that today. 1/24; the patient arrives today with a cleaner looking her wound circumference but with the same rolled edges. Change to silver alginate last time because of drainage. Wound surface looks somewhat better although it precariously close to the underlying bone here. 1/31; she arrives with a continued improvement in the wound circumference less callus less thick skin. I changed her back to silver collagen today as the surface appears to be improved and the granulation looks stable. She sees [name] on  2/9 2/7; patient sees [name] on 2/9. Arrives with a considerable amount of thick callus around this wound the area is essentially unchanged she has been using silver collagen 2/14; [name] is ordered a CT scan of her foot on 2/22; we are using silver collagen. She had an MRI last year of this foot that did not show osteomyelitis I think that is what he is looking to determine however. Apparently a plain x-ray that was done that was negative 2/28; CT scan of the foot ordered by [name] showed advanced neuropathic change about the midfoot. Subluxation of the talus and navicular but no acute bony abnormality. He has been using silver collagen 3/14; patient follows up with [name] on Thursday. I am wondering whether there is any foot conserving surgical options to help get closure here. I have not been able to get this wound to close. Every time she comes in there is thick callus and subcutaneous tissue around the wound margins. I am assuming that this is because of inadequate pressure relief despite the patient's best effort with her crutches. T  be fair we have had 2 rounds of a total contact cast here o which have not worked either Engineer, maintenance) Signed: 03/30/2020 5:58:13 PM By: Linton Ham MD Entered By: Linton Ham on 03/30/2020 14:50:22 -------------------------------------------------------------------------------- Physical Exam Details Patient Name: Date of Service: [name], [name] 03/30/2020 1:45 PM Medical Record Number: [id number] Patient Account Number: [id number] Date of Birth/Sex: Treating RN: July 10, 1955 (65 y.o. [name] Primary Care Provider: MA SO UDI, [place] MA LSA DA T Other Clinician: Referring Provider: Treating Provider/Extender: Linton Ham MA SO UDI, ELHA MA LSA DA T Weeks in Treatment: 47 Constitutional Sitting or standing Blood Pressure is within target range for patient.. Pulse regular and within target range for patient.Marland Kitchen  Respirations regular, non-labored and within target range.. Temperature is normal and within the target range for the patient.Marland Kitchen Appears in no distress. Notes Wound exam; absolutely no change thick rolled edges of callus skin and subcutaneous tissue around the margins of the wound. The wound bed does not look too bad. Aggressive debridements of this area have resulted in absolutely no improvement in fact right back to the way it looked and is short of period of time is 2 weeks. There is no evidence of surrounding infection Electronic Signature(s) Signed: 03/30/2020 5:58:13 PM By: Linton Ham MD Entered By: Linton Ham on 03/30/2020 14:51:19 -------------------------------------------------------------------------------- Physician Orders Details Patient Name: Date of Service: [name], [name] 03/30/2020 1:45 PM Medical Record Number: [id number] Patient Account Number: [id number] Date of Birth/Sex: Treating RN: [birth year] (65 y.o. [name] Primary Care Provider: Other Clinician: MA SO UDI, Edd Fabian LSA DA T Referring Provider: Treating Provider/Extender: Linton Ham MA SO UDI, ELHA MA LSA DA T Weeks in Treatment: 61 Verbal / Phone Orders: No Diagnosis Coding Follow-up Appointments Return Appointment in 2 weeks. Bathing/ Shower/ Hygiene May shower with protection but do not get wound dressing(s) wet. Edema Control - Lymphedema / SCD / Other Elevate legs to the level of the heart or above for 30 minutes daily and/or when sitting, a frequency of: - throughout the day Avoid standing for long periods of time. Patient to wear own compression stockings every day. - left leg daily Moisturize legs daily. - left leg daily Off-Loading Open toe surgical shoe to: - Right Foot Wound Treatment Wound #9 - Foot Wound Laterality: Right, Medial Cleanser: Byram Ancillary Kit - 15 Day Supply (Generic) Every Other Day/15 Days Discharge Instructions: Use supplies as instructed; Kit  contains: (15) Saline Bullets; (15) 3x3 Gauze; 15 pr Gloves Cleanser: Soap and Water Every Other Day/15 Days Discharge Instructions: May shower and wash wound with dial antibacterial soap and water prior to dressing change. Peri-Wound Care: Sween Lotion (Moisturizing lotion) Every Other Day/15 Days Discharge Instructions: Apply moisturizing lotion as directed Prim Dressing: PolyMem Silver Non-Adhesive Dressing, 4.25x4.25 in (Generic) Every Other Day/15 Days ary Discharge Instructions: Apply to wound bed as instructed Secondary Dressing: Woven Gauze Sponge, Non-Sterile 4x4 in (Generic) Every Other Day/15 Days Discharge Instructions: Apply over primary dressing as directed. Secondary Dressing: ABD Pad, 5x9 (DME) Every Other Day/15 Days Discharge Instructions: Apply over primary dressing as directed. Secondary Dressing: Felt 2.5 yds x 5.5 in Every Other Day/15 Days Discharge Instructions: Apply over primary dressing cut to form callous pad around wound Secured With: Kerlix Roll Sterile, 4.5x3.1 (in/yd) (DME) (Generic) Every Other Day/15 Days Discharge Instructions: Secure with Kerlix as directed. Secured With: Paper Tape, 2x10 (in/yd) (Generic) Every Other Day/15 Days Discharge Instructions: Secure dressing with tape as directed. Electronic Signature(s) Signed:  03/30/2020 2:30:52 PM By: Lorrin Jackson Signed: 03/30/2020 5:58:13 PM By: Linton Ham MD Entered By: Lorrin Jackson on 03/30/2020 14:30:50 -------------------------------------------------------------------------------- Problem List Details Patient Name: Date of Service: [name], [name] 03/30/2020 1:45 PM Medical Record Number: [id number] Patient Account Number: [id number] Date of Birth/Sex: Treating RN: [birth year] (65 y.o. [name] Primary Care Provider: MA SO UDI, Willette Pa MA LSA DA T Other Clinician: Referring Provider: Treating Provider/Extender: Marlynn Perking, ELHA MA LSA DA T Weeks in Treatment:  51 Active Problems ICD-10 Encounter Code Description Active Date MDM Diagnosis E11.621 Type 2 diabetes mellitus with foot ulcer 01/28/2019 No Yes L97.511 Non-pressure chronic ulcer of other part of right foot limited to breakdown of 01/28/2019 No Yes skin E11.42 Type 2 diabetes mellitus with diabetic polyneuropathy 01/28/2019 No Yes M14.671 Charcot's joint, right ankle and foot 01/28/2019 No Yes Inactive Problems ICD-10 Code Description Active Date Inactive Date L97.524 Non-pressure chronic ulcer of other part of left foot with necrosis of bone 01/28/2019 01/28/2019 L97.514 Non-pressure chronic ulcer of other part of right foot with necrosis of bone 01/28/2019 01/28/2019 T61.443 Charcot's joint, left ankle and foot 01/28/2019 01/28/2019 X54.008 Other chronic osteomyelitis, left ankle and foot 04/08/2019 04/08/2019 Resolved Problems Electronic Signature(s) Signed: 03/30/2020 5:58:13 PM By: Linton Ham MD Entered By: Linton Ham on 03/30/2020 14:48:32 -------------------------------------------------------------------------------- Progress Note Details Patient Name: Date of Service: [name], [name] 03/30/2020 1:45 PM Medical Record Number: [id number] Patient Account Number: [id number] Date of Birth/Sex: Treating RN: 1955/02/15 (65 y.o. [name] Primary Care Provider: MA SO UDI, [place] MA LSA DA T Other Clinician: Referring Provider: Treating Provider/Extender: Linton Ham MA SO UDI, ELHA MA LSA DA T Weeks in Treatment: 78 Subjective History of Present Illness (HPI) 65 yrs old bf here for follow up recurrent left charcot foot ulcer. She has DM. She is not a smoker. She states a blister developed 3 months ago at site of previous breakdown from 1 year ago. It was debrided at the podiatrist's office. She went to the ER and then sent here. She denies pain. Xray was negative for osteo. Culture from this clinic negative. 09/08/14 [name] to [name] for Charcot  foot. Seen by him and MRI scheduled 09/19/14. Prior silver collagen but this was not ordered last visit, patient has been rinsing with saline alone. Re institute silver collagen every other day. F/u 2 weeks with Dr. Dellia Nims as Labor Day holiday. Supplies ordered. 09/25/14; this is a patient with a Charcot foot on the left. she has a wound over the plantar aspect of her left calcaneus. She has been seen by [name] of orthopedic surgery and has had an MRI. She is apparently going within the next week or 2 for corrective surgery which will involve an external fixator. I don't have any of the details of this. 10/06/14; The patient is a 65 yrs old bf with a left Charcot foot and ulcer on the plantar. 12/01/14 Returns post surgery with [name]. Has follow up visit later this week with him, currently in facility and NWB over this extremity. States no drainage from foot and no current wound care. On exam callus and scab in place, suspect healed. Has external fixator in place. Will defer to [name] for management, ok to place moisturizing cream over scabs and callus and she will call if she requires follow up here. READMISSION 01/28/2019 Patient was in the clinic in 2016 for a wound on the plantar aspect of her left  calcaneus. Predominantly cared for by Dr. [name] she was referred to [name] and had corrective surgery for the position of her left ankle I believe. She had previously been here in 2015 and then prior to that in 2011 2012 that I do not have these records. More recently she has developed fairly extensive wounds on the right medial foot, left lateral foot and a small area on the right medial great toe. Right medial foot wound has been there for 6 to 7 months, left foot wound for 2 to 3 months and the right great toe only over the last few weeks. She has been followed by [name] at Dr. [name] office it sounds as though she has been undergoing callus paring and silver nitrate. The  patient has been washing these off with soap and water and plan applying dry dressing Past medical history, type 2 diabetes well controlled with a recent hemoglobin A1c of 7 on 11/16, type 2 diabetes with peripheral neuropathy, previous left Charcot joint surgery bilateral Charcot joints currently, stage III chronic renal failure, hypertension, obstructive sleep apnea, history of MRSA and gastroesophageal reflux. 1/18; this is a patient with bilateral Charcot feet. Plain x-rays that I did of both of these wounds that are either at bone or precariously close to bone were done. On the right foot this showed possible lytic destruction involving the anterior aspect of the talus which may represent a wound osteomyelitis. An MRI was recommended. On the left there was no definite lytic destruction although the wound is deep undermining's and I think probably requires an MRI on this basis in any case 1/25; patient was supposed to have her MRI last Friday of her bilateral feet however they would not do it because there was silver alginate in her dressings, will replace with calcium alginate today and will see if we can get her done today 2/1; patient did not get her MRIs done last week because of issues with the MRI department receiving orders to do bilateral feet. She has 2 deep wounds in the setting of Charcot feet deformities bilaterally. Both of them at one point had exposed bone although I had trouble demonstrating that today. I am not sure that we can offload these very aggressively as I watched her leave the clinic last week her gait is very narrow and unsteady. 2/15; the MRI of her right foot did not show osteomyelitis potentially osteonecrosis of the talus. 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 The wounds are a large wound on the right medial foot and on the left a substantial wound on the left lateral plantar calcaneus. We have been using  silver alginate. Ultimately I think we need to consider a total contact cast on the right while we work through the possibility of osteomyelitis in the left. I watched her walk out with her crutches I have some trepidation about the ability to walk with the cast on her right foot. Nevertheless with her Charcot deformities I do not think there is a way to heal these short of offloading them aggressively. 2/22; the culture of the bone in the left foot that I did last week showed a few Citrobacter Koseri as well as some streptococcal species. In my attempted bone for pathology did not actually yield a lot of bone the pathologist did not identify any osteomyelitis. Nevertheless based on the MRI, bone for culture and the probing wound into the bone itself I think this woman has  osteomyelitis in the left foot and we will refer her to infectious disease. Is promised today and aggressive debridement of the right foot and a total contact cast which surprisingly she seemed to tolerate quite well 03/13/2019 upon evaluation today patient appears to be doing well with her total contact cast she is here for the first cast change. She had no areas of rubbing or any other complications at this point. Overall things are doing quite well. 2/26; the patient was kindly seen by Dr. Megan Salon of infectious disease on 2/25. She is now on IV vancomycin PICC line placement in 2 days and oral Flagyl. This was in response to her MRI showing osteomyelitis of the left calcaneus concerning for osteomyelitis. We have been putting her right leg in a total contact cast. Our nurses report drainage. She is tolerating the total contact cast well. 3/4; the patient is started her IV antibiotics and is taking IV vancomycin and oral Flagyl. She appears to be tolerating these both well. This is for osteomyelitis involving the left calcaneus. The area on the right in the setting of bilateral Charcot deformities did not have any bone infection  on MRI. We put a total contact cast on her with silver alginate as the primary dressing and she returns today in follow-up. Per our intake nurse there was too much drainage to leave this on all week therefore we will bring her back for an early change 3/8; follow-up total contact cast she is still having a lot of drainage probably too much drainage from the right foot wound will week with the same cast. She will be back on Thursday. The area on the left looks somewhat better 3/11; patient here for a total contact cast change. 3/15; patient came in for wound care evaluation. She is still on IV vancomycin and oral Flagyl for osteomyelitis in the left foot. The area on the right foot we are putting in a total contact cast. 3/18 for total contact cast change on the right foot 3/22; the patient was here for wound review today. The area on the right foot is slightly smaller in terms of surface area. However the surface of the wound is very gritty and hyper granulated. More problematically the area on the plantar left foot has increased depth indeed in the center of this it probes to bone. We have been using Hydrofera Blue in both areas. She is on antibiotics as prescribed by infectious disease IV vancomycin and oral Flagyl 3/25; the patient is in for total contact cast change. Our intake nurse was concerned about the amount of drainage which soaked right to the multiple layers we are putting on this. Wondered about options. The wound bed actually looks as healthy as this is looked although there may not be a lot of change in dimensions things are looking a lot better. If we are going to heal this woman's wound which is in the middle of her right Charcot foot this is going to need to be offloaded. There are not many alternatives 3/29; still not a lot of improvement although the surface of the right wound looks better. We have been using Hydrofera Blue 04/17/2019 upon evaluation today patient appears to be  doing well with a total contact cast. With actually having to change this twice a week. She saw Dr. Dellia Nims for wound care earlier in the week this is for the cast change today. That is due to the fact that she has significant drainage. Overall the wound is been  looking excellent however. 4/5; we continue to put a total contact cast on this patient with Hydrofera Blue. Still a lot of drainage which precludes a simple weekly change or changing this twice a week. 4/8; total contact cast changed today. Apparently the wound looks better per our intake nurse 4/12; patient here for wound care evaluation. Wounds on the right foot have not changed in dimension none about a month left foot looks similar. Apparently her antibiotics that we are giving her for osteomyelitis in the left foot complete on Wednesday. We have been using Hydrofera Blue on the right foot under a total contact cast. Nurses report greenish drainage although I think this may be discoloration from the [facility] 4/15; back for cast change wounds look quite good although still moderate amount of drainage. T contact cast reapplied otal 4/19;. Wound evaluation. Wound on the right foot is smaller surface looks healthy. There is still the divot in the middle part of this wound but I could not feel any bone. Area on the left foot also looks better She has completed her antibiotics but still has the PICC line in. 4/23; patient comes back for a total contact cast change this was done in the standard fashion no issues 4/26; wound measures slightly larger on the right foot which is disappointing. She sees Dr. Megan Salon with regards to her antibiotics on Wednesday. I believe she is on vancomycin IV and oral Flagyl. 4/29; in for her routine total contact cast change. She saw Dr. Megan Salon this morning. He is asking about antibiotic continuation I will be in touch with him. I did not look at her wound today 5/3; patient saw Dr. Megan Salon on 4/28. I  did send him a note asking about the continuation of perhaps an oral regimen. I have not heard back. She still has an elevated sedimentation rate at 81. Her wounds are smaller. 5/6; in for her total contact cast change. We did not look at the wound today. She has seen Dr. Megan Salon and is now on oral Keflex and Flagyl. She seems to be tolerating these well 5/10; she had her PICC line removed. The wound on the right foot after some initial improvement has not made any improvement even with the total contact cast. Still having a lot of drainage. Likewise the area on the left foot really is not any better either 5/14; in for a cast change today. We will look at the right foot on Monday. If this is not making any progress I am going to try to cast the left foot. 5/17; the wound on the right foot is absolutely no different. I am going to put ointment on this area and change the cast of the left foot. We have been using silver alginate there 5/21; apparently the area on the right foot was better per our intake nurse although I did not see it. We did a standard total contact cast change on the left foot we have been applying silver alginate to the wound here. 06/10/19-Area on the right foot per dimensions slightly better, the left foot has still some depth applying silver alginate to the wound with TCC 06/13/19-Patient here for Casting to Left foot 6/1; patient had the total contact cast were placed on the left foot. Noted some swelling in her right leg although the dimensions of the wound on the right foot are better. We have been using PolyMem on the right and silver alginate on the left under the cast 6/7; we continue to  make nice progress in both her plantar feet wound. We are putting the total contact cast on the left. Surprisingly in spite of this the area on the right medial foot in the middle of her Charcot deformities actually is making progress as well 07/01/2019 upon evaluation today patient  actually appears to be doing quite well with regard to her wounds on the feet compared to when I saw her last that has been quite some time. With that being said I do believe the total contact cast has been beneficial in this left foot and she has a lot of signs of improvement she does have some callus overhanging which is caused a little bit of a ledge and an issue here. I do believe that we may be able to do something to help in that regard. Fortunately otherwise the wound seems to be progressing quite nicely. 6/21; the patient's area on the left foot that we are currently casting is just about closed. The area on the right medial foot has a healthy looking surface but may be not changed as much from last week in terms of surface area. We are using silver alginate on the right and polymen Ag under the cast 6/28; only a small open area on the left foot remains. Using silver alginate under a total contact cast. She has a diabetic shoe I have asked her to bring that for next week where this may be closed. The area on the right foot with a Charcot deformity is also measuring slightly smaller. We are using PolyMem Ag here 7/6; left foot is totally closed. She does not require a total contact cast. She will graduate to her own diabetic shoe. ooThe area on the right foot with a Charcot deformity measuring about the same. We have been using polymen under compression wraps. She did not do well in this area with a total contact cast 7/13; left foot remains intact. She is skillfully covering this area with foam and using her diabetic shoe. Slight area improvement of the right foot wound. Still rolled senescent looking edges. We have been using polymen 7/19; left foot remains intact. Wound measuring slightly smaller on the right foot in the middle of the Charcot deformity. We have been using PolyMem Ag 7/26; per the patient left foot remains intact. The wound on the right foot looks slightly smaller but  dimensions are measuring the same. We have been using polymen Ag 8/9-Patient back after 2 weeks, wound of the right foot again looking slightly smaller, continuing to use PolyMem silver 8/16; patient has made some minor improvements in wound area on the right foot. Using polymen Ag. The left foot remains healed 8/23; continued slight improvement in dimensions. Rolled edges around the wound edge noted. We have been using polymen Ag. Patient offloading with a surgical shoe and crutches 8/30; no change in surface area. Thick rolled skin and subcutaneous tissue around the edges debris on the wound surface. We have been using polymen Ag initially with some improvement however stalled on the last 2 weeks in terms of surface area 9/13; surface area is smaller still using polymen Ag. Thick rolled skin and subcutaneous tissue around the edges requiring debridement. 9/20; again surface area is improved using polymen Ag however each week she develops thick rolled skin and subcutaneous tissue around the wound edges requiring debridement. She is offloading this using crutches 9/27; we've been using PolyMem AG. Wound does not look any different from last week. Raised edges of callus skin and  subcutaneous tissue around the wound bed. 10/5; we been using polymen Ag. No ability to put a total contact cast on today in the clinic. Wound is about the same still rolled edges of thick callus around the margins 10/18; we put her in a total contact cast last week things did not go well. Not only does the wound looked worse macerated angry with thick callus and some warmth around it. She had another wound above this and one on the anterior right leg. I am not totally certain why this happened. She has not been systemically unwell 10/25; culture from last week grew Pseudomonas and MRSA. My experience with this MRSA is usually a true infection and Pseudomonas a colonizer I am going to put her on doxycycline today we put  gentamicin on the wound surface. Continuing with silver alginate 11/1; she has tolerated her antibiotics. I gave her doxycycline. Was still using gentamicin. The original wound looks about the same thick rolled edges. The cast injury just above it is superficial 11/8; even after an aggressive debridement last week the wound on her medial Charcot foot on the right looks about the same. The surface looks satisfactory however rolled edges of callus thick skin and subcutaneous tissue. She did not do well on 2 separate occasions in a total contact cast. The cast abrasion she has on the medial foot is just above closed. We have been using polymen Ag. She is attempting to offload this is much as she can with her crutches 11/15; the wound on the Charcot right foot about the same in terms of surface area.. The area above this on the ankle is healed. 11/29; Charcot right foot wound. This is medially. Wound may be slightly smaller however still raised edges of callus and skin around the wound circumference. We have been using polymen 12/6; Charcot right foot wound. This is medially on the plantar aspect. I did not debride this today wound looks about the same in a week she is reappeared with raised edges of callus and thick skin around the wound. We have been using polymen no real improvement 12/13; again an extensive debridement today. Thick callus skin and subcutaneous tissue around the wound margin with undermining. We have been using silver collagen since last week 12/20; in spite of the debridement last week the wound really looks no difference. Circumferential callus shallow wound granulation does not look too bad. We have been using silver collagen really no improvement 1/10 patient comes in with a wound looking about the same. Circumferential callus and subcutaneous tissue. I did not debride this today we have been using Sorbact and wrapping her leg. Even putting her in a total contact cast did not help  here. She has an appointment with [name] on 2/9. Question of whether a surgical procedure to correct the deformity underneath this area might help. 02/05/2020 upon evaluation today patient actually appears to be stalling to some degree here. Fortunately there is no signs of active infection at this time. She does have a significant callus buildup and therefore before rewrapping the staff wanted me to see if I could see her and remove some of the callus. I was definitely happy to do that today. 1/24; the patient arrives today with a cleaner looking her wound circumference but with the same rolled edges. Change to silver alginate last time because of drainage. Wound surface looks somewhat better although it precariously close to the underlying bone here. 1/31; she arrives with a continued improvement in the  wound circumference less callus less thick skin. I changed her back to silver collagen today as the surface appears to be improved and the granulation looks stable. She sees [name] on 2/9 2/7; patient sees [name] on 2/9. Arrives with a considerable amount of thick callus around this wound the area is essentially unchanged she has been using silver collagen 2/14; [name] is ordered a CT scan of her foot on 2/22; we are using silver collagen. She had an MRI last year of this foot that did not show osteomyelitis I think that is what he is looking to determine however. Apparently a plain x-ray that was done that was negative 2/28; CT scan of the foot ordered by [name] showed advanced neuropathic change about the midfoot. Subluxation of the talus and navicular but no acute bony abnormality. He has been using silver collagen 3/14; patient follows up with [name] on Thursday. I am wondering whether there is any foot conserving surgical options to help get closure here. I have not been able to get this wound to close. Every time she comes in there is thick callus and subcutaneous tissue  around the wound margins. I am assuming that this is because of inadequate pressure relief despite the patient's best effort with her crutches. T be fair we have had 2 rounds of a total contact cast here o which have not worked either Objective Constitutional Sitting or standing Blood Pressure is within target range for patient.. Pulse regular and within target range for patient.Marland Kitchen Respirations regular, non-labored and within target range.. Temperature is normal and within the target range for the patient.Marland Kitchen Appears in no distress. Vitals Time Taken: 1:48 PM, Height: 68 in, Weight: 265 lbs, BMI: 40.3, Temperature: 98.0 F, Pulse: 68 bpm, Respiratory Rate: 20 breaths/min, Blood Pressure: 114/59 mmHg, Capillary Blood Glucose: 127 mg/dl. General Notes: Wound exam; absolutely no change thick rolled edges of callus skin and subcutaneous tissue around the margins of the wound. The wound bed does not look too bad. Aggressive debridements of this area have resulted in absolutely no improvement in fact right back to the way it looked and is short of period of time is 2 weeks. There is no evidence of surrounding infection Integumentary (Hair, Skin) Wound #9 status is Open. Original cause of wound was Blister. The date acquired was: 06/18/2018. The wound has been in treatment 61 weeks. The wound is located on the Right,Medial Foot. The wound measures 1.6cm length x 1.5cm width x 0.6cm depth; 1.885cm^2 area and 1.131cm^3 volume. There is Fat Layer (Subcutaneous Tissue) exposed. There is no tunneling or undermining noted. There is a medium amount of serosanguineous drainage noted. The wound margin is thickened. There is large (67-100%) pink, pale granulation within the wound bed. There is no necrotic tissue within the wound bed. 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 Bathing/ Shower/ Hygiene: May shower with protection but do not get wound dressing(s) wet. Edema Control - Lymphedema / SCD / Other: Elevate legs to the level of the heart or above for 30 minutes daily and/or when sitting, a frequency of: - throughout the day Avoid standing for long periods of time. Patient to wear own compression stockings every day. - left leg daily Moisturize legs daily. - left leg  daily Off-Loading: Open toe surgical shoe to: - Right Foot WOUND #9: - Foot Wound Laterality: Right, Medial Cleanser: Byram Ancillary Kit - 15 Day Supply (Generic) Every Other Day/15 Days Discharge Instructions: Use supplies as instructed; Kit contains: (15) Saline Bullets; (15) 3x3 Gauze; 15 pr Gloves Cleanser: Soap and Water Every Other Day/15 Days Discharge Instructions: May shower and wash wound with dial antibacterial soap and water prior to dressing change. Peri-Wound Care: Sween Lotion (Moisturizing lotion) Every Other Day/15 Days Discharge Instructions: Apply moisturizing lotion as directed Prim Dressing: PolyMem Silver Non-Adhesive Dressing, 4.25x4.25 in (Generic) Every Other Day/15 Days ary Discharge Instructions: Apply to wound bed as instructed Secondary Dressing: Woven Gauze Sponge, Non-Sterile 4x4 in (Generic) Every Other Day/15 Days Discharge Instructions: Apply over primary dressing as directed. Secondary Dressing: ABD Pad, 5x9 (DME) Every Other Day/15 Days Discharge Instructions: Apply over primary dressing as directed. Secondary Dressing: Felt 2.5 yds x 5.5 in Every Other Day/15 Days Discharge Instructions: Apply over primary dressing cut to form callous pad around wound Secured With: Kerlix Roll Sterile, 4.5x3.1 (in/yd) (DME) (Generic) Every Other Day/15 Days Discharge Instructions: Secure with Kerlix as directed. Secured With: Paper T ape,  2x10 (in/yd) (Generic) Every Other Day/15 Days Discharge Instructions: Secure dressing with tape as directed. 1. Continue with polymen Ag until we get the final word from [name] about whether he thinks any surgical options here would be helpful in getting this wound to close. Electronic Signature(s) Signed: 03/30/2020 5:58:13 PM By: Linton Ham MD Entered By: Linton Ham on 03/30/2020 14:52:23 -------------------------------------------------------------------------------- SuperBill Details Patient Name: Date of Service: [name], [name] 03/30/2020 Medical Record Number: [id number] Patient Account Number: [id number] Date of Birth/Sex: Treating RN: 12-31-1955 (65 y.o. [name] Primary Care Provider: MA SO UDI, [place] MA LSA DA T Other Clinician: Referring Provider: Treating Provider/Extender: Marlynn Perking, ELHA MA LSA DA T Weeks in Treatment: 15 Diagnosis Coding ICD-10 Codes Code Description E11.621 Type 2 diabetes mellitus with foot ulcer L97.511 Non-pressure chronic ulcer of other part of right foot limited to breakdown of skin E11.42 Type 2 diabetes mellitus with diabetic polyneuropathy M14.671 Charcot's joint, right ankle and foot Facility Procedures CPT4 Code: 79396886 Description: 99213 - WOUND CARE VISIT-LEV 3 EST PT Modifier: Quantity: 1 Physician Procedures : CPT4 Code Description Modifier [id number] - WC PHYS LEVEL 3 - EST PT ICD-10 Diagnosis Description L97.511 Non-pressure chronic ulcer of other part of right foot limited to breakdown of skin E11.621 Type 2 diabetes mellitus with foot ulcer Quantity: 1 Electronic Signature(s) Signed: 03/30/2020 5:58:13 PM By: Linton Ham MD Entered By: Linton Ham on 03/30/2020 14:52:45

## 2020-03-30 NOTE — Progress Notes (Signed)
TAMURA, LASKY ([id number]) Visit Report for 03/30/2020 Arrival Information Details Patient Name: Date of Service: [name], [name] 03/30/2020 1:45 PM Medical Record Number: [id number] Patient Account Number: [id number] Date of Birth/Sex: Treating RN: 1955-06-03 (65 y.o. [name] Primary Care Dinari Stgermaine: MA SO UDI, [place] MA LSA DA T Other Clinician: Referring Malike Foglio: Treating Kapri Nero/Extender: Linton Ham MA SO UDI, ELHA MA LSA DA T Weeks in Treatment: 63 Visit Information History Since Last Visit Added or deleted any medications: No Patient Arrived: Crutches Any new allergies or adverse reactions: No Arrival Time: 13:47 Had a fall or experienced change in No Accompanied By: self activities of daily living that may affect Transfer Assistance: None risk of falls: Patient Identification Verified: Yes Signs or symptoms of abuse/neglect since last visito No Secondary Verification Process Completed: Yes Hospitalized since last visit: No Patient Requires Transmission-Based Precautions: No Implantable device outside of the clinic excluding No Patient Has Alerts: No cellular tissue based products placed in the center since last visit: Has Dressing in Place as Prescribed: Yes Pain Present Now: No Electronic Signature(s) Signed: 03/30/2020 5:39:15 PM By: Sandre Kitty Entered By: Sandre Kitty on 03/30/2020 13:48:21 -------------------------------------------------------------------------------- Clinic Level of Care Assessment Details Patient Name: Date of Service: [name], [name] 03/30/2020 1:45 PM Medical Record Number: [id number] Patient Account Number: [id number] Date of Birth/Sex: Treating RN: 09-24-1955 (65 y.o. [name] Primary Care Styles Fambro: MA SO UDI, ELHA MA LSA DA T Other Clinician: Referring Aidric Endicott: Treating Matisse Salais/Extender: Linton Ham MA SO UDI, ELHA MA LSA DA T Weeks in Treatment: 63 Clinic Level of Care Assessment  Items TOOL 4 Quantity Score X- 1 0 Use when only an EandM is performed on FOLLOW-UP visit ASSESSMENTS - Nursing Assessment / Reassessment X- 1 10 Reassessment of Co-morbidities (includes updates in patient status) X- 1 5 Reassessment of Adherence to Treatment Plan ASSESSMENTS - Wound and Skin A ssessment / Reassessment X - Simple Wound Assessment / Reassessment - one wound 1 5 _0  - 0 Complex Wound Assessment / Reassessment - multiple wounds _1  - 0 Dermatologic / Skin Assessment (not related to wound area) ASSESSMENTS - Focused Assessment _2  - 0 Circumferential Edema Measurements - multi extremities _3  - 0 Nutritional Assessment / Counseling / Intervention _4  - 0 Lower Extremity Assessment (monofilament, tuning fork, pulses) _5  - 0 Peripheral Arterial Disease Assessment (using hand held doppler) ASSESSMENTS - Ostomy and/or Continence Assessment and Care _6  - 0 Incontinence Assessment and Management _7  - 0 Ostomy Care Assessment and Management (repouching, etc.) PROCESS - Coordination of Care _8  - 0 Simple Patient / Family Education for ongoing care X- 1 20 Complex (extensive) Patient / Family Education for ongoing care X- 1 10 Staff obtains Programmer, systems, Records, T Results / Process Orders est _9  - 0 Staff telephones HHA, Nursing Homes / Clarify orders / etc _10  - 0 Routine Transfer to another Facility (non-emergent condition) _11  - 0 Routine Hospital Admission (non-emergent condition) _12  - 0 New Admissions / Biomedical engineer / Ordering NPWT Apligraf, etc. , _13  - 0 Emergency Hospital Admission (emergent condition) _14  - 0 Simple Discharge Coordination _15  - 0 Complex (extensive) Discharge Coordination PROCESS - Special Needs _16  - 0 Pediatric / Minor Patient Management _17  - 0 Isolation Patient Management _18  - 0 Hearing / Language / Visual special needs _19  - 0 Assessment of Community assistance (transportation, D/C planning, etc.) _20  - 0 Additional  assistance / Altered mentation _21  - 0 Support Surface(s) Assessment (bed, cushion, seat, etc.) INTERVENTIONS - Wound Cleansing /  Measurement X - Simple Wound Cleansing - one wound 1 5 _0  - 0 Complex Wound Cleansing - multiple wounds X- 1 5 Wound Imaging (photographs - any number of wounds) _1  - 0 Wound Tracing (instead of photographs) X- 1 5 Simple Wound Measurement - one wound _2  - 0 Complex Wound Measurement - multiple wounds INTERVENTIONS - Wound Dressings _3  - 0 Small Wound Dressing one or multiple wounds X- 1 15 Medium Wound Dressing one or multiple wounds _4  - 0 Large Wound Dressing one or multiple wounds X- 1 5 Application of Medications - topical <[id number]>_1<\/[id number]>_4  - 0 Application of Medications - injection INTERVENTIONS - Miscellaneous _6  - 0 External ear exam _7  - 0 Specimen Collection (cultures, biopsies, blood, body fluids, etc.) _8  - 0 Specimen(s) / Culture(s) sent or taken to Lab for analysis _9  - 0 Patient Transfer (multiple staff / Civil Service fast streamer / Similar devices) _10  - 0 Simple Staple / Suture removal (25 or less) _11  - 0 Complex Staple / Suture removal (26 or more) _12  - 0 Hypo / Hyperglycemic Management (close monitor of Blood Glucose) _13  - 0 Ankle / Brachial Index (ABI) - do not check if billed separately X- 1 5 Vital Signs Has the patient been seen at the hospital within the last three years: Yes Total Score: 90 Level Of Care: New/Established - Level 3 Electronic Signature(s) Signed: 03/30/2020 5:53:54 PM By: Lorrin Jackson Entered By: Lorrin Jackson on 03/30/2020 14:26:24 -------------------------------------------------------------------------------- Encounter Discharge Information Details Patient Name: Date of Service: [name], [name] 03/30/2020 1:45 PM Medical Record Number: [id number] Patient Account Number: [id number] Date of Birth/Sex: Treating RN: 25-Jan-1955 (65 y.o. [name] Primary Care Malaiah Viramontes: MA SO UDI, [place]lette Pa MA LSA DA T Other  Clinician: Referring Ryleah Miramontes: Treating Jacobie Stamey/Extender: Linton Ham MA SO UDI, ELHA MA LSA DA T Weeks in Treatment: 74 Encounter Discharge Information Items Discharge Condition: Stable Ambulatory Status: Crutches Discharge Destination: Home Transportation: Private Auto Accompanied By: self Schedule Follow-up Appointment: Yes Clinical Summary of Care: Electronic Signature(s) Signed: 03/30/2020 6:12:14 PM By: Deon Pilling Entered By: Deon Pilling on 03/30/2020 16:13:32 -------------------------------------------------------------------------------- Lower Extremity Assessment Details Patient Name: Date of Service: [name], [name] 03/30/2020 1:45 PM Medical Record Number: [id number] Patient Account Number: [id number] Date of Birth/Sex: Treating RN: [birth year] (65 y.o. [name] Primary Care Haze Antillon: MA SO UDI, [place] MA LSA DA T Other Clinician: Referring Brenda Samano: Treating Rosio Weiss/Extender: Linton Ham MA SO UDI, ELHA MA LSA DA T Weeks in Treatment: 23 Edema Assessment Assessed: [Left: No] [Right: No] Edema: [Left: N] [Right: o] Calf Left: Right: Point of Measurement: 43 cm From Medial Instep 32 cm Ankle Left: Right: Point of Measurement: 12 cm From Medial Instep 21 cm Vascular Assessment Pulses: Dorsalis Pedis Palpable: [Right:Yes] Electronic Signature(s) Signed: 03/30/2020 6:16:37 PM By: Levan Hurst RN, BSN Entered By: Levan Hurst on 03/30/2020 14:02:08 -------------------------------------------------------------------------------- Multi Wound Chart Details Patient Name: Date of Service: [name], [name] 03/30/2020 1:45 PM Medical Record Number: [id number] Patient Account Number: [id number] Date of Birth/Sex: Treating RN: [birth year] (65 y.o. [name] Primary Care Maeli Spacek: MA SO UDI, [place] MA LSA DA T Other Clinician: Referring Nakai Pollio: Treating Axxel Gude/Extender: Linton Ham MA SO UDI, ELHA MA LSA DA T Weeks in  Treatment: 14 Vital Signs Height(in): 68 Capillary Blood Glucose(mg/dl): 127 Weight(lbs): 265 Pulse(bpm): 68 Body Mass Index(BMI): 40 Blood Pressure(mmHg): 114/59 Temperature(F): 98.0 Respiratory Rate(breaths/min): 20 Photos: [9:No Photos Right, Medial Foot] [N/A:N/A N/A] Wound Location: [9:Blister] [N/A:N/A] Wounding Event: [9:Diabetic Wound/Ulcer of the Lower] [N/A:N/A]  Primary Etiology: [9:Extremity Glaucoma, Chronic sinus] [N/A:N/A] Comorbid History: [9:problems/congestion, Anemia, Sleep Apnea, Hypertension, Type II Diabetes, Rheumatoid Arthritis, Neuropathy 06/18/2018] [N/A:N/A] Date Acquired: [9:61] [N/A:N/A] Weeks of Treatment: [9:Open] [N/A:N/A] Wound Status: [9:1.6x1.5x0.6] [N/A:N/A] Measurements L x W x D (cm) [9:1.885] [N/A:N/A] A (cm) : rea [9:1.131] [N/A:N/A] Volume (cm) : [9:82.90%] [N/A:N/A] % Reduction in A rea: [9:88.60%] [N/A:N/A] % Reduction in Volume: [9:Grade 2] [N/A:N/A] Classification: [9:Medium] [N/A:N/A] Exudate A mount: [9:Serosanguineous] [N/A:N/A] Exudate Type: [9:red, brown] [N/A:N/A] Exudate Color: [9:Thickened] [N/A:N/A] Wound Margin: [9:Large (67-100%)] [N/A:N/A] Granulation A mount: [9:Pink, Pale] [N/A:N/A] Granulation Quality: [9:None Present (0%)] [N/A:N/A] Necrotic A mount: [9:Fat Layer (Subcutaneous Tissue): Yes N/A] Exposed Structures: [9:Fascia: No Tendon: No Muscle: No Joint: No Bone: No Small (1-33%)] [N/A:N/A] Treatment Notes Electronic Signature(s) Signed: 03/30/2020 5:58:13 PM By: Linton Ham MD Signed: 03/30/2020 6:16:37 PM By: Levan Hurst RN, BSN Entered By: Linton Ham on 03/30/2020 14:48:41 -------------------------------------------------------------------------------- Multi-Disciplinary Care Plan Details Patient Name: Date of Service: [name], [name] 03/30/2020 1:45 PM Medical Record Number: [id number] Patient Account Number: [id number] Date of Birth/Sex: Treating RN: 1955-06-26 (65 y.o. [name] Primary Care Kaisen Ackers: MA SO UDI, [place] MA LSA DA T Other Clinician: Referring Taryll Reichenberger: Treating Alexee Delsanto/Extender: Linton Ham MA SO UDI, ELHA MA LSA DA T Weeks in Treatment: 10 [place] reviewed with physician Active Inactive Wound/Skin Impairment Nursing Diagnoses: Impaired tissue integrity Knowledge deficit related to ulceration/compromised skin integrity Goals: Patient/caregiver will verbalize understanding of skin care regimen Date Initiated: 01/28/2019 Target Resolution Date: 05/04/2020 Goal Status: Active Interventions: Assess patient/caregiver ability to obtain necessary supplies Assess patient/caregiver ability to perform ulcer/skin care regimen upon admission and as needed Assess ulceration(s) every visit Provide education on ulcer and skin care Notes: Electronic Signature(s) Signed: 03/30/2020 5:53:54 PM By: Lorrin Jackson Signed: 03/30/2020 6:16:37 PM By: Levan Hurst RN, BSN Entered By: Lorrin Jackson on 03/30/2020 14:23:41 -------------------------------------------------------------------------------- Pain Assessment Details Patient Name: Date of Service: [name], [name] 03/30/2020 1:45 PM Medical Record Number: [id number] Patient Account Number: [id number] Date of Birth/Sex: Treating RN: [birth year] (65 y.o. [name] Primary Care Gerad Cornelio: MA SO UDI, [place] MA LSA DA T Other Clinician: Referring Caasi Giglia: Treating Auriel Kist/Extender: Linton Ham MA SO UDI, ELHA MA LSA DA T Weeks in Treatment: 59 Active Problems Location of Pain Severity and Description of Pain Patient Has Paino No Site Locations Pain Management and Medication Current Pain Management: Electronic Signature(s) Signed: 03/30/2020 5:39:15 PM By: Sandre Kitty Signed: 03/30/2020 6:16:37 PM By: Levan Hurst RN, BSN Entered By: Sandre Kitty on 03/30/2020  13:50:33 -------------------------------------------------------------------------------- Patient/Caregiver Education Details Patient Name: Date of Service: [name] 3/14/2022andnbsp1:45 PM Medical Record Number: [id number] Patient Account Number: [id number] Date of Birth/Gender: Treating RN: [birth year] (65 y.o. [name] Primary Care Physician: MA SO UDI, [place] MA LSA DA T Other Clinician: Referring Physician: Treating Physician/Extender: Linton Ham MA SO UDI, ELHA MA LSA DA T Weeks in Treatment: 82 Education Assessment Education Provided To: Patient Education Topics Provided Offloading: Methods: Explain/Verbal, Printed Responses: State content correctly Wound/Skin Impairment: Methods: Explain/Verbal, Printed Responses: State content correctly Electronic Signature(s) Signed: 03/30/2020 5:53:54 PM By: Lorrin Jackson Entered By: Lorrin Jackson on 03/30/2020 14:24:09 -------------------------------------------------------------------------------- Wound Assessment Details Patient Name: Date of Service: [name], Sarah Charles. 03/30/2020 1:45 PM Medical Record Number: [id number] Patient Account Number: [id number] Date of Birth/Sex: Treating RN: December 21, 1955 (65 y.o. [name] Primary Care Veva Grimley: MA Particia Lather MA LSA DA T Other Clinician: Referring Aryel Edelen: Treating Paola Flynt/Extender: Linton Ham MA SO  UDI, ELHA MA LSA DA T Weeks in Treatment: 61 Wound Status Wound Number: 9 Primary Diabetic Wound/Ulcer of the Lower Extremity Etiology: Wound Location: Right, Medial Foot Wound Open Wounding Event: Blister Status: Date Acquired: 06/18/2018 Comorbid Glaucoma, Chronic sinus problems/congestion, Anemia, Sleep Weeks Of Treatment: 61 History: Apnea, Hypertension, Type II Diabetes, Rheumatoid Arthritis, Clustered Wound: No Neuropathy Photos Wound Measurements Length: (cm) 1.6 Width: (cm) 1.5 Depth: (cm) 0.6 Area: (cm) 1.885 Volume: (cm)  1.131 % Reduction in Area: 82.9% % Reduction in Volume: 88.6% Epithelialization: Small (1-33%) Tunneling: No Undermining: No Wound Description Classification: Grade 2 Wound Margin: Thickened Exudate Amount: Medium Exudate Type: Serosanguineous Exudate Color: red, brown Foul Odor After Cleansing: No Slough/Fibrino No Wound Bed Granulation Amount: Large (67-100%) Exposed Structure Granulation Quality: Pink, Pale Fascia Exposed: No Necrotic Amount: None Present (0%) Fat Layer (Subcutaneous Tissue) Exposed: Yes Tendon Exposed: No Muscle Exposed: No Joint Exposed: No Bone Exposed: No Treatment Notes Wound #9 (Foot) Wound Laterality: Right, Medial Cleanser Byram Ancillary Kit - 15 Day Supply Discharge Instruction: Use supplies as instructed; Kit contains: (15) Saline Bullets; (15) 3x3 Gauze; 15 pr Gloves Soap and Water Discharge Instruction: May shower and wash wound with dial antibacterial soap and water prior to dressing change. Peri-Wound Care Sween Lotion (Moisturizing lotion) Discharge Instruction: Apply moisturizing lotion as directed Topical Primary Dressing PolyMem Silver Non-Adhesive Dressing, 4.25x4.25 in Discharge Instruction: Apply to wound bed as instructed Secondary Dressing Woven Gauze Sponge, Non-Sterile 4x4 in Discharge Instruction: Apply over primary dressing as directed. ABD Pad, 5x9 Discharge Instruction: Apply over primary dressing as directed. Felt 2.5 yds x 5.5 in Discharge Instruction: Apply over primary dressing cut to form callous pad around wound Secured With Kerlix Roll Sterile, 4.5x3.1 (in/yd) Discharge Instruction: Secure with Kerlix as directed. Paper Tape, 2x10 (in/yd) Discharge Instruction: Secure dressing with tape as directed. Compression Wrap Compression Stockings Add-Ons Electronic Signature(s) Signed: 03/30/2020 5:39:15 PM By: Sandre Kitty Signed: 03/30/2020 6:16:37 PM By: Levan Hurst RN, BSN Entered By: Sandre Kitty on  03/30/2020 17:31:20 -------------------------------------------------------------------------------- Vitals Details Patient Name: Date of Service: [name], [name] 03/30/2020 1:45 PM Medical Record Number: [id number] Patient Account Number: [id number] Date of Birth/Sex: Treating RN: [birth year] (65 y.o. [name] Primary Care Echo Propp: MA SO UDI, [place] MA LSA DA T Other Clinician: Referring Gurnoor Sloop: Treating Janiyha Montufar/Extender: Linton Ham MA SO UDI, ELHA MA LSA DA T Weeks in Treatment: 8 Vital Signs Time Taken: 13:48 Temperature (F): 98.0 Height (in): 68 Pulse (bpm): 68 Weight (lbs): 265 Respiratory Rate (breaths/min): 20 Body Mass Index (BMI): 40.3 Blood Pressure (mmHg): 114/59 Capillary Blood Glucose (mg/dl): 127 Reference Range: 80 - 120 mg / dl Electronic Signature(s) Signed: 03/30/2020 5:39:15 PM By: Sandre Kitty Entered By: Sandre Kitty on 03/30/2020 13:50:23

## 2020-04-04 ENCOUNTER — Other Ambulatory Visit: Payer: Self-pay | Admitting: Internal Medicine

## 2020-04-04 DIAGNOSIS — E1169 Type 2 diabetes mellitus with other specified complication: Secondary | ICD-10-CM

## 2020-04-06 ENCOUNTER — Encounter (HOSPITAL_BASED_OUTPATIENT_CLINIC_OR_DEPARTMENT_OTHER): Payer: Medicare Other | Admitting: Internal Medicine

## 2020-04-13 ENCOUNTER — Other Ambulatory Visit: Payer: Self-pay

## 2020-04-13 ENCOUNTER — Encounter (HOSPITAL_BASED_OUTPATIENT_CLINIC_OR_DEPARTMENT_OTHER): Payer: Medicare Other | Admitting: Internal Medicine

## 2020-04-13 DIAGNOSIS — E11621 Type 2 diabetes mellitus with foot ulcer: Secondary | ICD-10-CM | POA: Diagnosis not present

## 2020-04-13 NOTE — Progress Notes (Signed)
[name] ([id number]) Visit Report for 04/13/2020 HPI Details Patient Name: Date of Service: [name] 04/13/2020 1:45 PM Medical Record Number: [id number] Patient Account Number: [id number] Date of Birth/Sex: Treating RN: 1955/01/23 (65 y.o. [name] Primary Care Provider: MA SO UDI, [place] MA LSA DA T Other Clinician: Referring Provider: Treating Provider/Extender: Linton Ham MA SO UDI, ELHA MA LSA DA T Weeks in Treatment: 21 History of Present Illness HPI Description: 65 yrs old bf here for follow up recurrent left charcot foot ulcer. She has DM. She is not a smoker. She states a blister developed 3 months ago at site of previous breakdown from 1 year ago. It was debrided at the podiatrist's office. She went to the ER and then sent here. She denies pain. Xray was negative for osteo. Culture from this clinic negative. 09/08/14 [name] to [name] for Charcot foot. Seen by him and MRI scheduled 09/19/14. Prior silver collagen but this was not ordered last visit, patient has been rinsing with saline alone. Re institute silver collagen every other day. F/u 2 weeks with Dr. Dellia Nims as Labor Day holiday. Supplies ordered. 09/25/14; this is a patient with a Charcot foot on the left. she has a wound over the plantar aspect of her left calcaneus. She has been seen by [name] of orthopedic surgery and has had an MRI. She is apparently going within the next week or 2 for corrective surgery which will involve an external fixator. I don't have any of the details of this. 10/06/14; The patient is a 65 yrs old bf with a left Charcot foot and ulcer on the plantar. 12/01/14 Returns post surgery with [name]. Has follow up visit later this week with him, currently in facility and NWB over this extremity. States no drainage from foot and no current wound care. On exam callus and scab in place, suspect healed. Has external fixator in place. Will defer to Dr.  [name] for management, ok to place moisturizing cream over scabs and callus and she will call if she requires follow up here. READMISSION 01/28/2019 Patient was in the clinic in 2016 for a wound on the plantar aspect of her left calcaneus. Predominantly cared for by Dr. [name] she was referred to [name] and had corrective surgery for the position of her left ankle I believe. She had previously been here in 2015 and then prior to that in 2011 2012 that I do not have these records. More recently she has developed fairly extensive wounds on the right medial foot, left lateral foot and a small area on the right medial great toe. Right medial foot wound has been there for 6 to 7 months, left foot wound for 2 to 3 months and the right great toe only over the last few weeks. She has been followed by [name] at Dr. [name] office it sounds as though she has been undergoing callus paring and silver nitrate. The patient has been washing these off with soap and water and plan applying dry dressing Past medical history, type 2 diabetes well controlled with a recent hemoglobin A1c of 7 on 11/16, type 2 diabetes with peripheral neuropathy, previous left Charcot joint surgery bilateral Charcot joints currently, stage III chronic renal failure, hypertension, obstructive sleep apnea, history of MRSA and gastroesophageal reflux. 1/18; this is a patient with bilateral Charcot feet. Plain x-rays that I did of both of these wounds that are either at bone or precariously close to bone  were done. On the right foot this showed possible lytic destruction involving the anterior aspect of the talus which may represent a wound osteomyelitis. An MRI was recommended. On the left there was no definite lytic destruction although the wound is deep undermining's and I think probably requires an MRI on this basis in any case 1/25; patient was supposed to have her MRI last Friday of her bilateral feet however they would  not do it because there was silver alginate in her dressings, will replace with calcium alginate today and will see if we can get her done today 2/1; patient did not get her MRIs done last week because of issues with the MRI department receiving orders to do bilateral feet. She has 2 deep wounds in the setting of Charcot feet deformities bilaterally. Both of them at one point had exposed bone although I had trouble demonstrating that today. I am not sure that we can offload these very aggressively as I watched her leave the clinic last week her gait is very narrow and unsteady. 2/15; the MRI of her right foot did not show osteomyelitis potentially osteonecrosis of the talus. 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 The wounds are a large wound on the right medial foot and on the left a substantial wound on the left lateral plantar calcaneus. We have been using silver alginate. Ultimately I think we need to consider a total contact cast on the right while we work through the possibility of osteomyelitis in the left. I watched her walk out with her crutches I have some trepidation about the ability to walk with the cast on her right foot. Nevertheless with her Charcot deformities I do not think there is a way to heal these short of offloading them aggressively. 2/22; the culture of the bone in the left foot that I did last week showed a few Citrobacter Koseri as well as some streptococcal species. In my attempted bone for pathology did not actually yield a lot of bone the pathologist did not identify any osteomyelitis. 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 Is promised today and aggressive debridement of the right foot and a total contact cast which surprisingly she seemed to tolerate quite well 03/13/2019  upon evaluation today patient appears to be doing well with her total contact cast she is here for the first cast change. She had no areas of rubbing or any other complications at this point. Overall things are doing quite well. 2/26; the patient was kindly seen by Dr. Megan Salon of infectious disease on 2/25. She is now on IV vancomycin PICC line placement in 2 days and oral Flagyl. This was in response to her MRI showing osteomyelitis of the left calcaneus concerning for osteomyelitis. We have been putting her right leg in a total contact cast. Our nurses report drainage. She is tolerating the total contact cast well. 3/4; the patient is started her IV antibiotics and is taking IV vancomycin and oral Flagyl. She appears to be tolerating these both well. This is for osteomyelitis involving the left calcaneus. The area on the right in the setting of bilateral Charcot deformities did not have any bone infection on MRI. We put a total contact cast on her with silver alginate as the primary dressing and she returns today in follow-up. Per  our intake nurse there was too much drainage to leave this on all week therefore we will bring her back for an early change 3/8; follow-up total contact cast she is still having a lot of drainage probably too much drainage from the right foot wound will week with the same cast. She will be back on Thursday. The area on the left looks somewhat better 3/11; patient here for a total contact cast change. 3/15; patient came in for wound care evaluation. She is still on IV vancomycin and oral Flagyl for osteomyelitis in the left foot. The area on the right foot we are putting in a total contact cast. 3/18 for total contact cast change on the right foot 3/22; the patient was here for wound review today. The area on the right foot is slightly smaller in terms of surface area. However the surface of the wound is very gritty and hyper granulated. More problematically the area on  the plantar left foot has increased depth indeed in the center of this it probes to bone. We have been using Hydrofera Blue in both areas. She is on antibiotics as prescribed by infectious disease IV vancomycin and oral Flagyl 3/25; the patient is in for total contact cast change. Our intake nurse was concerned about the amount of drainage which soaked right to the multiple layers we are putting on this. Wondered about options. The wound bed actually looks as healthy as this is looked although there may not be a lot of change in dimensions things are looking a lot better. If we are going to heal this woman's wound which is in the middle of her right Charcot foot this is going to need to be offloaded. There are not many alternatives 3/29; still not a lot of improvement although the surface of the right wound looks better. We have been using Hydrofera Blue 04/17/2019 upon evaluation today patient appears to be doing well with a total contact cast. With actually having to change this twice a week. She saw Dr. Dellia Nims for wound care earlier in the week this is for the cast change today. That is due to the fact that she has significant drainage. Overall the wound is been looking excellent however. 4/5; we continue to put a total contact cast on this patient with Hydrofera Blue. Still a lot of drainage which precludes a simple weekly change or changing this twice a week. 4/8; total contact cast changed today. Apparently the wound looks better per our intake nurse 4/12; patient here for wound care evaluation. Wounds on the right foot have not changed in dimension none about a month left foot looks similar. Apparently her antibiotics that we are giving her for osteomyelitis in the left foot complete on Wednesday. We have been using Hydrofera Blue on the right foot under a total contact cast. Nurses report greenish drainage although I think this may be discoloration from the [facility] 4/15; back for cast  change wounds look quite good although still moderate amount of drainage. T contact cast reapplied otal 4/19;. Wound evaluation. Wound on the right foot is smaller surface looks healthy. There is still the divot in the middle part of this wound but I could not feel any bone. Area on the left foot also looks better She has completed her antibiotics but still has the PICC line in. 4/23; patient comes back for a total contact cast change this was done in the standard fashion no issues 4/26; wound measures slightly larger on the right  foot which is disappointing. She sees Dr. Megan Salon with regards to her antibiotics on Wednesday. I believe she is on vancomycin IV and oral Flagyl. 4/29; in for her routine total contact cast change. She saw Dr. Megan Salon this morning. He is asking about antibiotic continuation I will be in touch with him. I did not look at her wound today 5/3; patient saw Dr. Megan Salon on 4/28. I did send him a note asking about the continuation of perhaps an oral regimen. I have not heard back. She still has an elevated sedimentation rate at 81. Her wounds are smaller. 5/6; in for her total contact cast change. We did not look at the wound today. She has seen Dr. Megan Salon and is now on oral Keflex and Flagyl. She seems to be tolerating these well 5/10; she had her PICC line removed. The wound on the right foot after some initial improvement has not made any improvement even with the total contact cast. Still having a lot of drainage. Likewise the area on the left foot really is not any better either 5/14; in for a cast change today. We will look at the right foot on Monday. If this is not making any progress I am going to try to cast the left foot. 5/17; the wound on the right foot is absolutely no different. I am going to put ointment on this area and change the cast of the left foot. We have been using silver alginate there 5/21; apparently the area on the right foot was better per our  intake nurse although I did not see it. We did a standard total contact cast change on the left foot we have been applying silver alginate to the wound here. 06/10/19-Area on the right foot per dimensions slightly better, the left foot has still some depth applying silver alginate to the wound with TCC 06/13/19-Patient here for Casting to Left foot 6/1; patient had the total contact cast were placed on the left foot. Noted some swelling in her right leg although the dimensions of the wound on the right foot are better. We have been using PolyMem on the right and silver alginate on the left under the cast 6/7; we continue to make nice progress in both her plantar feet wound. We are putting the total contact cast on the left. Surprisingly in spite of this the area on the right medial foot in the middle of her Charcot deformities actually is making progress as well 07/01/2019 upon evaluation today patient actually appears to be doing quite well with regard to her wounds on the feet compared to when I saw her last that has been quite some time. With that being said I do believe the total contact cast has been beneficial in this left foot and she has a lot of signs of improvement she does have some callus overhanging which is caused a little bit of a ledge and an issue here. I do believe that we may be able to do something to help in that regard. Fortunately otherwise the wound seems to be progressing quite nicely. 6/21; the patient's area on the left foot that we are currently casting is just about closed. The area on the right medial foot has a healthy looking surface but may be not changed as much from last week in terms of surface area. We are using silver alginate on the right and polymen Ag under the cast 6/28; only a small open area on the left foot remains. Using silver alginate  under a total contact cast. She has a diabetic shoe I have asked her to bring that for next week where this may be closed.  The area on the right foot with a Charcot deformity is also measuring slightly smaller. We are using PolyMem Ag here 7/6; left foot is totally closed. She does not require a total contact cast. She will graduate to her own diabetic shoe. The area on the right foot with a Charcot deformity measuring about the same. We have been using polymen under compression wraps. She did not do well in this area with a total contact cast 7/13; left foot remains intact. She is skillfully covering this area with foam and using her diabetic shoe. Slight area improvement of the right foot wound. Still rolled senescent looking edges. We have been using polymen 7/19; left foot remains intact. Wound measuring slightly smaller on the right foot in the middle of the Charcot deformity. We have been using PolyMem Ag 7/26; per the patient left foot remains intact. The wound on the right foot looks slightly smaller but dimensions are measuring the same. We have been using polymen Ag 8/9-Patient back after 2 weeks, wound of the right foot again looking slightly smaller, continuing to use PolyMem silver 8/16; patient has made some minor improvements in wound area on the right foot. Using polymen Ag. The left foot remains healed 8/23; continued slight improvement in dimensions. Rolled edges around the wound edge noted. We have been using polymen Ag. Patient offloading with a surgical shoe and crutches 8/30; no change in surface area. Thick rolled skin and subcutaneous tissue around the edges debris on the wound surface. We have been using polymen Ag initially with some improvement however stalled on the last 2 weeks in terms of surface area 9/13; surface area is smaller still using polymen Ag. Thick rolled skin and subcutaneous tissue around the edges requiring debridement. 9/20; again surface area is improved using polymen Ag however each week she develops thick rolled skin and subcutaneous tissue around the wound  edges requiring debridement. She is offloading this using crutches 9/27; we've been using PolyMem AG. Wound does not look any different from last week. Raised edges of callus skin and subcutaneous tissue around the wound bed. 10/5; we been using polymen Ag. No ability to put a total contact cast on today in the clinic. Wound is about the same still rolled edges of thick callus around the margins 10/18; we put her in a total contact cast last week things did not go well. Not only does the wound looked worse macerated angry with thick callus and some warmth around it. She had another wound above this and one on the anterior right leg. I am not totally certain why this happened. She has not been systemically unwell 10/25; culture from last week grew Pseudomonas and MRSA. My experience with this MRSA is usually a true infection and Pseudomonas a colonizer I am going to put her on doxycycline today we put gentamicin on the wound surface. Continuing with silver alginate 11/1; she has tolerated her antibiotics. I gave her doxycycline. Was still using gentamicin. The original wound looks about the same thick rolled edges. The cast injury just above it is superficial 11/8; even after an aggressive debridement last week the wound on her medial Charcot foot on the right looks about the same. The surface looks satisfactory however rolled edges of callus thick skin and subcutaneous tissue. She did not do well on 2 separate occasions in a  total contact cast. The cast abrasion she has on the medial foot is just above closed. We have been using polymen Ag. She is attempting to offload this is much as she can with her crutches 11/15; the wound on the Charcot right foot about the same in terms of surface area.. The area above this on the ankle is healed. 11/29; Charcot right foot wound. This is medially. Wound may be slightly smaller however still raised edges of callus and skin around the wound circumference. We  have been using polymen 12/6; Charcot right foot wound. This is medially on the plantar aspect. I did not debride this today wound looks about the same in a week she is reappeared with raised edges of callus and thick skin around the wound. We have been using polymen no real improvement 12/13; again an extensive debridement today. Thick callus skin and subcutaneous tissue around the wound margin with undermining. We have been using silver collagen since last week 12/20; in spite of the debridement last week the wound really looks no difference. Circumferential callus shallow wound granulation does not look too bad. We have been using silver collagen really no improvement 1/10 patient comes in with a wound looking about the same. Circumferential callus and subcutaneous tissue. I did not debride this today we have been using Sorbact and wrapping her leg. Even putting her in a total contact cast did not help here. She has an appointment with [name] on 2/9. Question of whether a surgical procedure to correct the deformity underneath this area might help. 02/05/2020 upon evaluation today patient actually appears to be stalling to some degree here. Fortunately there is no signs of active infection at this time. She does have a significant callus buildup and therefore before rewrapping the staff wanted me to see if I could see her and remove some of the callus. I was definitely happy to do that today. 1/24; the patient arrives today with a cleaner looking her wound circumference but with the same rolled edges. Change to silver alginate last time because of drainage. Wound surface looks somewhat better although it precariously close to the underlying bone here. 1/31; she arrives with a continued improvement in the wound circumference less callus less thick skin. I changed her back to silver collagen today as the surface appears to be improved and the granulation looks stable. She sees [name] on  2/9 2/7; patient sees [name] on 2/9. Arrives with a considerable amount of thick callus around this wound the area is essentially unchanged she has been using silver collagen 2/14; [name] is ordered a CT scan of her foot on 2/22; we are using silver collagen. She had an MRI last year of this foot that did not show osteomyelitis I think that is what he is looking to determine however. Apparently a plain x-ray that was done that was negative 2/28; CT scan of the foot ordered by [name] showed advanced neuropathic change about the midfoot. Subluxation of the talus and navicular but no acute bony abnormality. He has been using silver collagen 3/14; patient follows up with [name] on Thursday. I am wondering whether there is any foot conserving surgical options to help get closure here. I have not been able to get this wound to close. Every time she comes in there is thick callus and subcutaneous tissue around the wound margins. I am assuming that this is because of inadequate pressure relief despite the patient's best effort with her crutches. T  be fair we have had 2 rounds of a total contact cast here o which have not worked either 3/28; patient saw [name] but we do not have a note. He is apparently planning for surgery in a month although I am not completely sure what procedure he has planned. I have not been able to get this wound to close even with pressure relief of a total contact cast on 2 occasions. The area that had underlying osteomyelitis on the left foot did closed with a total contact cast after treatment for the underlying infection. Electronic Signature(s) Signed: 04/13/2020 5:22:38 PM By: Linton Ham MD Entered By: Linton Ham on 04/13/2020 14:16:15 -------------------------------------------------------------------------------- Physical Exam Details Patient Name: Date of Service: [name], [name] 04/13/2020 1:45 PM Medical Record Number:  [id number] Patient Account Number: [id number] Date of Birth/Sex: Treating RN: 1955-08-22 (65 y.o. [name] Primary Care Provider: MA SO UDI, [place] MA LSA DA T Other Clinician: Referring Provider: Treating Provider/Extender: Linton Ham MA SO UDI, EL[place] MA LSA DA T Weeks in Treatment: 52 Constitutional Sitting or standing Blood Pressure is within target range for patient.. Pulse regular and within target range for patient.Marland Kitchen Respirations regular, non-labored and within target range.. Temperature is normal and within the target range for the patient.Marland Kitchen Appears in no distress. Cardiovascular Pedal pulses palpable. Notes Wound exam; as usual thick callus and subcutaneous tissue around the wound margins. The base of the wound looks reasonable. I have aggressively debrided this many times down to levels around the wound with healthy looking tissue it simply does not work. I did not do this today there were no palpable deep structures Electronic Signature(s) Signed: 04/13/2020 5:22:38 PM By: Linton Ham MD Entered By: Linton Ham on 04/13/2020 14:17:08 -------------------------------------------------------------------------------- Physician Orders Details Patient Name: Date of Service: [facility], [name] 04/13/2020 1:45 PM Medical Record Number: [id number] Patient Account Number: [id number] Date of Birth/Sex: Treating RN: August 26, 1955 (65 y.o. [name] Primary Care Provider: MA SO UDI, [place] MA LSA DA T Other Clinician: Referring Provider: Treating Provider/Extender: Linton Ham MA SO UDI, [place] MA LSA DA T Weeks in Treatment: 43 Verbal / Phone Orders: No Diagnosis Coding ICD-10 Coding Code Description E11.621 Type 2 diabetes mellitus with foot ulcer L97.511 Non-pressure chronic ulcer of other part of right foot limited to breakdown of skin E11.42 Type 2 diabetes mellitus with diabetic polyneuropathy M14.671 Charcot's joint, right ankle and foot Follow-up  Appointments Return Appointment in 2 weeks. Bathing/ Shower/ Hygiene May shower with protection but do not get wound dressing(s) wet. Edema Control - Lymphedema / SCD / Other Elevate legs to the level of the heart or above for 30 minutes daily and/or when sitting, a frequency of: - throughout the day Avoid standing for long periods of time. Patient to wear own compression stockings every day. - left leg daily Moisturize legs daily. - left leg daily Off-Loading Open toe surgical shoe to: - Right Foot Wound Treatment Wound #9 - Foot Wound Laterality: Right, Medial Cleanser: [employer] Ancillary Kit - 15 Day Supply (DME) (Generic) Every Other Day/15 Days Discharge Instructions: Use supplies as instructed; Kit contains: (15) Saline Bullets; (15) 3x3 Gauze; 15 pr Gloves Cleanser: Soap and Water Every Other Day/15 Days Discharge Instructions: May shower and wash wound with dial antibacterial soap and water prior to dressing change. Peri-Wound Care: Sween Lotion (Moisturizing lotion) Every Other Day/15 Days Discharge Instructions: Apply moisturizing lotion as directed Prim Dressing: Promogran Prisma Matrix, 4.34 (sq in) (silver collagen) (DME) (Generic) Every Other  Day/15 Days ary Discharge Instructions: Moisten collagen with saline or hydrogel Secondary Dressing: Woven Gauze Sponge, Non-Sterile 4x4 in (Generic) Every Other Day/15 Days Discharge Instructions: Apply over primary dressing as directed. Secondary Dressing: ABD Pad, 5x9 (DME) Every Other Day/15 Days Discharge Instructions: Apply over primary dressing as directed. Secondary Dressing: Felt 2.5 yds x 5.5 in Every Other Day/15 Days Discharge Instructions: Apply over primary dressing cut to form callous pad around wound Secured With: Kerlix Roll Sterile, 4.5x3.1 (in/yd) (DME) (Generic) Every Other Day/15 Days Discharge Instructions: Secure with Kerlix as directed. Secured With: Paper Tape, 2x10 (in/yd) (DME) (Generic) Every Other Day/15  Days Discharge Instructions: Secure dressing with tape as directed. Electronic Signature(s) Signed: 04/13/2020 5:22:38 PM By: Linton Ham MD Signed: 04/13/2020 5:28:50 PM By: Levan Hurst RN, BSN Entered By: Levan Hurst on 04/13/2020 14:10:44 -------------------------------------------------------------------------------- Problem List Details Patient Name: Date of Service: ENO [name], [name] 04/13/2020 1:45 PM Medical Record Number: [id number] Patient Account Number: [id number] Date of Birth/Sex: Treating RN: [birth year] (65 y.o. [name] Primary Care Provider: MA SO UDI, Willette Pa MA LSA DA T Other Clinician: Referring Provider: Treating Provider/Extender: Marlynn Perking, ELHA MA LSA DA T Weeks in Treatment: 24 Active Problems ICD-10 Encounter Code Description Active Date MDM Diagnosis E11.621 Type 2 diabetes mellitus with foot ulcer 01/28/2019 No Yes L97.511 Non-pressure chronic ulcer of other part of right foot limited to breakdown of 01/28/2019 No Yes skin E11.42 Type 2 diabetes mellitus with diabetic polyneuropathy 01/28/2019 No Yes M14.671 Charcot's joint, right ankle and foot 01/28/2019 No Yes Inactive Problems ICD-10 Code Description Active Date Inactive Date L97.524 Non-pressure chronic ulcer of other part of left foot with necrosis of bone 01/28/2019 01/28/2019 L97.514 Non-pressure chronic ulcer of other part of right foot with necrosis of bone 01/28/2019 01/28/2019 J82.505 Charcot's joint, left ankle and foot 01/28/2019 01/28/2019 L97.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 04/13/2020 1:45 PM Medical Record Number:  [id number] Patient Account Number: [id number] Date of Birth/Sex: Treating RN: 1955/04/12 (65 y.o. [name] Primary Care Provider: MA SO UDI, [place] MA LSA DA T Other Clinician: Referring Provider: Treating Provider/Extender: Linton Ham MA SO UDI, ELHA MA LSA DA T Weeks in Treatment: 59 Subjective History of Present Illness (HPI) 65 yrs old bf here for follow up recurrent left charcot foot ulcer. She has DM. She is not a smoker. She states a blister developed 3 months ago at site of previous breakdown from 1 year ago. It was debrided at the podiatrist's office. She went to the ER and then sent here. She denies pain. Xray was negative for osteo. Culture from this clinic negative. 09/08/14 [name] to [name] for Charcot foot. Seen by him and MRI scheduled 09/19/14. Prior silver collagen but this was not ordered last visit, patient has been rinsing with saline alone. Re institute silver collagen every other day. F/u 2 weeks with Dr. Dellia Nims as Labor Day holiday. Supplies ordered. 09/25/14; this is a patient with a Charcot foot on the left. she has a wound over the plantar aspect of her left calcaneus. She has been seen by [name] of orthopedic surgery and has had an MRI. She is apparently going within the next week or 2 for corrective surgery which will involve an external fixator. I  don't have any of the details of this. 10/06/14; The patient is a 65 yrs old bf with a left Charcot foot and ulcer on the plantar. 12/01/14 Returns post surgery with [name]. Has follow up visit later this week with him, currently in facility and NWB over this extremity. States no drainage from foot and no current wound care. On exam callus and scab in place, suspect healed. Has external fixator in place. Will defer to [name] for management, ok to place moisturizing cream over scabs and callus and she will call if she requires follow up here. READMISSION 01/28/2019 Patient was in the  clinic in 2016 for a wound on the plantar aspect of her left calcaneus. Predominantly cared for by Dr. [name] she was referred to [name] and had corrective surgery for the position of her left ankle I believe. She had previously been here in 2015 and then prior to that in 2011 2012 that I do not have these records. More recently she has developed fairly extensive wounds on the right medial foot, left lateral foot and a small area on the right medial great toe. Right medial foot wound has been there for 6 to 7 months, left foot wound for 2 to 3 months and the right great toe only over the last few weeks. She has been followed by [name] at Dr. [name] office it sounds as though she has been undergoing callus paring and silver nitrate. The patient has been washing these off with soap and water and plan applying dry dressing Past medical history, type 2 diabetes well controlled with a recent hemoglobin A1c of 7 on 11/16, type 2 diabetes with peripheral neuropathy, previous left Charcot joint surgery bilateral Charcot joints currently, stage III chronic renal failure, hypertension, obstructive sleep apnea, history of MRSA and gastroesophageal reflux. 1/18; this is a patient with bilateral Charcot feet. Plain x-rays that I did of both of these wounds that are either at bone or precariously close to bone were done. On the right foot this showed possible lytic destruction involving the anterior aspect of the talus which may represent a wound osteomyelitis. An MRI was recommended. On the left there was no definite lytic destruction although the wound is deep undermining's and I think probably requires an MRI on this basis in any case 1/25; patient was supposed to have her MRI last Friday of her bilateral feet however they would not do it because there was silver alginate in her dressings, will replace with calcium alginate today and will see if we can get her done today 2/1; patient did not get  her MRIs done last week because of issues with the MRI department receiving orders to do bilateral feet. She has 2 deep wounds in the setting of Charcot feet deformities bilaterally. Both of them at one point had exposed bone although I had trouble demonstrating that today. I am not sure that we can offload these very aggressively as I watched her leave the clinic last week her gait is very narrow and unsteady. 2/15; the MRI of her right foot did not show osteomyelitis potentially osteonecrosis of the talus. 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 The wounds are a large wound on the right medial foot and on the left a substantial wound on the left lateral plantar calcaneus. We have been using silver alginate. Ultimately I think we need to consider a total contact cast on the right while we work through  the possibility of osteomyelitis in the left. I watched her walk out with her crutches I have some trepidation about the ability to walk with the cast on her right foot. Nevertheless with her Charcot deformities I do not think there is a way to heal these short of offloading them aggressively. 2/22; the culture of the bone in the left foot that I did last week showed a few Citrobacter Koseri as well as some streptococcal species. In my attempted bone for pathology did not actually yield a lot of bone the pathologist did not identify any osteomyelitis. 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 Is promised today and aggressive debridement of the right foot and a total contact cast which surprisingly she seemed to tolerate quite well 03/13/2019 upon evaluation today patient appears to be doing well with her total contact cast she is here for the first cast change. She had no areas of rubbing or any other  complications at this point. Overall things are doing quite well. 2/26; the patient was kindly seen by Dr. Megan Salon of infectious disease on 2/25. She is now on IV vancomycin PICC line placement in 2 days and oral Flagyl. This was in response to her MRI showing osteomyelitis of the left calcaneus concerning for osteomyelitis. We have been putting her right leg in a total contact cast. Our nurses report drainage. She is tolerating the total contact cast well. 3/4; the patient is started her IV antibiotics and is taking IV vancomycin and oral Flagyl. She appears to be tolerating these both well. This is for osteomyelitis involving the left calcaneus. The area on the right in the setting of bilateral Charcot deformities did not have any bone infection on MRI. We put a total contact cast on her with silver alginate as the primary dressing and she returns today in follow-up. Per our intake nurse there was too much drainage to leave this on all week therefore we will bring her back for an early change 3/8; follow-up total contact cast she is still having a lot of drainage probably too much drainage from the right foot wound will week with the same cast. She will be back on Thursday. The area on the left looks somewhat better 3/11; patient here for a total contact cast change. 3/15; patient came in for wound care evaluation. She is still on IV vancomycin and oral Flagyl for osteomyelitis in the left foot. The area on the right foot we are putting in a total contact cast. 3/18 for total contact cast change on the right foot 3/22; the patient was here for wound review today. The area on the right foot is slightly smaller in terms of surface area. However the surface of the wound is very gritty and hyper granulated. More problematically the area on the plantar left foot has increased depth indeed in the center of this it probes to bone. We have been using Hydrofera Blue in both areas. She is on antibiotics as  prescribed by infectious disease IV vancomycin and oral Flagyl 3/25; the patient is in for total contact cast change. Our intake nurse was concerned about the amount of drainage which soaked right to the multiple layers we are putting on this. Wondered about options. The wound bed actually looks as healthy as this is looked although there may not be a lot of change  in dimensions things are looking a lot better. If we are going to heal this woman's wound which is in the middle of her right Charcot foot this is going to need to be offloaded. There are not many alternatives 3/29; still not a lot of improvement although the surface of the right wound looks better. We have been using Hydrofera Blue 04/17/2019 upon evaluation today patient appears to be doing well with a total contact cast. With actually having to change this twice a week. She saw Dr. Dellia Nims for wound care earlier in the week this is for the cast change today. That is due to the fact that she has significant drainage. Overall the wound is been looking excellent however. 4/5; we continue to put a total contact cast on this patient with Hydrofera Blue. Still a lot of drainage which precludes a simple weekly change or changing this twice a week. 4/8; total contact cast changed today. Apparently the wound looks better per our intake nurse 4/12; patient here for wound care evaluation. Wounds on the right foot have not changed in dimension none about a month left foot looks similar. Apparently her antibiotics that we are giving her for osteomyelitis in the left foot complete on Wednesday. We have been using Hydrofera Blue on the right foot under a total contact cast. Nurses report greenish drainage although I think this may be discoloration from the [facility] 4/15; back for cast change wounds look quite good although still moderate amount of drainage. T contact cast reapplied otal 4/19;. Wound evaluation. Wound on the right foot is smaller  surface looks healthy. There is still the divot in the middle part of this wound but I could not feel any bone. Area on the left foot also looks better She has completed her antibiotics but still has the PICC line in. 4/23; patient comes back for a total contact cast change this was done in the standard fashion no issues 4/26; wound measures slightly larger on the right foot which is disappointing. She sees Dr. Megan Salon with regards to her antibiotics on Wednesday. I believe she is on vancomycin IV and oral Flagyl. 4/29; in for her routine total contact cast change. She saw Dr. Megan Salon this morning. He is asking about antibiotic continuation I will be in touch with him. I did not look at her wound today 5/3; patient saw Dr. Megan Salon on 4/28. I did send him a note asking about the continuation of perhaps an oral regimen. I have not heard back. She still has an elevated sedimentation rate at 81. Her wounds are smaller. 5/6; in for her total contact cast change. We did not look at the wound today. She has seen Dr. Megan Salon and is now on oral Keflex and Flagyl. She seems to be tolerating these well 5/10; she had her PICC line removed. The wound on the right foot after some initial improvement has not made any improvement even with the total contact cast. Still having a lot of drainage. Likewise the area on the left foot really is not any better either 5/14; in for a cast change today. We will look at the right foot on Monday. If this is not making any progress I am going to try to cast the left foot. 5/17; the wound on the right foot is absolutely no different. I am going to put ointment on this area and change the cast of the left foot. We have been using silver alginate there 5/21; apparently the area on the  right foot was better per our intake nurse although I did not see it. We did a standard total contact cast change on the left foot we have been applying silver alginate to the wound  here. 06/10/19-Area on the right foot per dimensions slightly better, the left foot has still some depth applying silver alginate to the wound with TCC 06/13/19-Patient here for Casting to Left foot 6/1; patient had the total contact cast were placed on the left foot. Noted some swelling in her right leg although the dimensions of the wound on the right foot are better. We have been using PolyMem on the right and silver alginate on the left under the cast 6/7; we continue to make nice progress in both her plantar feet wound. We are putting the total contact cast on the left. Surprisingly in spite of this the area on the right medial foot in the middle of her Charcot deformities actually is making progress as well 07/01/2019 upon evaluation today patient actually appears to be doing quite well with regard to her wounds on the feet compared to when I saw her last that has been quite some time. With that being said I do believe the total contact cast has been beneficial in this left foot and she has a lot of signs of improvement she does have some callus overhanging which is caused a little bit of a ledge and an issue here. I do believe that we may be able to do something to help in that regard. Fortunately otherwise the wound seems to be progressing quite nicely. 6/21; the patient's area on the left foot that we are currently casting is just about closed. The area on the right medial foot has a healthy looking surface but may be not changed as much from last week in terms of surface area. We are using silver alginate on the right and polymen Ag under the cast 6/28; only a small open area on the left foot remains. Using silver alginate under a total contact cast. She has a diabetic shoe I have asked her to bring that for next week where this may be closed. The area on the right foot with a Charcot deformity is also measuring slightly smaller. We are using PolyMem Ag here 7/6; left foot is totally closed.  She does not require a total contact cast. She will graduate to her own diabetic shoe. ooThe area on the right foot with a Charcot deformity measuring about the same. We have been using polymen under compression wraps. She did not do well in this area with a total contact cast 7/13; left foot remains intact. She is skillfully covering this area with foam and using her diabetic shoe. Slight area improvement of the right foot wound. Still rolled senescent looking edges. We have been using polymen 7/19; left foot remains intact. Wound measuring slightly smaller on the right foot in the middle of the Charcot deformity. We have been using PolyMem Ag 7/26; per the patient left foot remains intact. The wound on the right foot looks slightly smaller but dimensions are measuring the same. We have been using polymen Ag 8/9-Patient back after 2 weeks, wound of the right foot again looking slightly smaller, continuing to use PolyMem silver 8/16; patient has made some minor improvements in wound area on the right foot. Using polymen Ag. The left foot remains healed 8/23; continued slight improvement in dimensions. Rolled edges around the wound edge noted. We have been using polymen Ag. Patient offloading with  a surgical shoe and crutches 8/30; no change in surface area. Thick rolled skin and subcutaneous tissue around the edges debris on the wound surface. We have been using polymen Ag initially with some improvement however stalled on the last 2 weeks in terms of surface area 9/13; surface area is smaller still using polymen Ag. Thick rolled skin and subcutaneous tissue around the edges requiring debridement. 9/20; again surface area is improved using polymen Ag however each week she develops thick rolled skin and subcutaneous tissue around the wound edges requiring debridement. She is offloading this using crutches 9/27; we've been using PolyMem AG. Wound does not look any different from last week. Raised  edges of callus skin and subcutaneous tissue around the wound bed. 10/5; we been using polymen Ag. No ability to put a total contact cast on today in the clinic. Wound is about the same still rolled edges of thick callus around the margins 10/18; we put her in a total contact cast last week things did not go well. Not only does the wound looked worse macerated angry with thick callus and some warmth around it. She had another wound above this and one on the anterior right leg. I am not totally certain why this happened. She has not been systemically unwell 10/25; culture from last week grew Pseudomonas and MRSA. My experience with this MRSA is usually a true infection and Pseudomonas a colonizer I am going to put her on doxycycline today we put gentamicin on the wound surface. Continuing with silver alginate 11/1; she has tolerated her antibiotics. I gave her doxycycline. Was still using gentamicin. The original wound looks about the same thick rolled edges. The cast injury just above it is superficial 11/8; even after an aggressive debridement last week the wound on her medial Charcot foot on the right looks about the same. The surface looks satisfactory however rolled edges of callus thick skin and subcutaneous tissue. She did not do well on 2 separate occasions in a total contact cast. The cast abrasion she has on the medial foot is just above closed. We have been using polymen Ag. She is attempting to offload this is much as she can with her crutches 11/15; the wound on the Charcot right foot about the same in terms of surface area.. The area above this on the ankle is healed. 11/29; Charcot right foot wound. This is medially. Wound may be slightly smaller however still raised edges of callus and skin around the wound circumference. We have been using polymen 12/6; Charcot right foot wound. This is medially on the plantar aspect. I did not debride this today wound looks about the same in a week  she is reappeared with raised edges of callus and thick skin around the wound. We have been using polymen no real improvement 12/13; again an extensive debridement today. Thick callus skin and subcutaneous tissue around the wound margin with undermining. We have been using silver collagen since last week 12/20; in spite of the debridement last week the wound really looks no difference. Circumferential callus shallow wound granulation does not look too bad. We have been using silver collagen really no improvement 1/10 patient comes in with a wound looking about the same. Circumferential callus and subcutaneous tissue. I did not debride this today we have been using Sorbact and wrapping her leg. Even putting her in a total contact cast did not help here. She has an appointment with [name] on 2/9. Question of whether a surgical procedure  to correct the deformity underneath this area might help. 02/05/2020 upon evaluation today patient actually appears to be stalling to some degree here. Fortunately there is no signs of active infection at this time. She does have a significant callus buildup and therefore before rewrapping the staff wanted me to see if I could see her and remove some of the callus. I was definitely happy to do that today. 1/24; the patient arrives today with a cleaner looking her wound circumference but with the same rolled edges. Change to silver alginate last time because of drainage. Wound surface looks somewhat better although it precariously close to the underlying bone here. 1/31; she arrives with a continued improvement in the wound circumference less callus less thick skin. I changed her back to silver collagen today as the surface appears to be improved and the granulation looks stable. She sees [name] on 2/9 2/7; patient sees [name] on 2/9. Arrives with a considerable amount of thick callus around this wound the area is essentially unchanged she has been  using silver collagen 2/14; [name] is ordered a CT scan of her foot on 2/22; we are using silver collagen. She had an MRI last year of this foot that did not show osteomyelitis I think that is what he is looking to determine however. Apparently a plain x-ray that was done that was negative 2/28; CT scan of the foot ordered by [name] showed advanced neuropathic change about the midfoot. Subluxation of the talus and navicular but no acute bony abnormality. He has been using silver collagen 3/14; patient follows up with [name] on Thursday. I am wondering whether there is any foot conserving surgical options to help get closure here. I have not been able to get this wound to close. Every time she comes in there is thick callus and subcutaneous tissue around the wound margins. I am assuming that this is because of inadequate pressure relief despite the patient's best effort with her crutches. T be fair we have had 2 rounds of a total contact cast here o which have not worked either 3/28; patient saw [name] but we do not have a note. He is apparently planning for surgery in a month although I am not completely sure what procedure he has planned. I have not been able to get this wound to close even with pressure relief of a total contact cast on 2 occasions. The area that had underlying osteomyelitis on the left foot did closed with a total contact cast after treatment for the underlying infection. Objective Constitutional Sitting or standing Blood Pressure is within target range for patient.. Pulse regular and within target range for patient.Marland Kitchen Respirations regular, non-labored and within target range.. Temperature is normal and within the target range for the patient.Marland Kitchen Appears in no distress. Vitals Time Taken: 1:48 PM, Height: 68 in, Weight: 265 lbs, BMI: 40.3, Temperature: 97.9 F, Pulse: 77 bpm, Respiratory Rate: 18 breaths/min, Blood Pressure: 121/73 mmHg, Capillary Blood Glucose:  130 mg/dl. Cardiovascular Pedal pulses palpable. General Notes: Wound exam; as usual thick callus and subcutaneous tissue around the wound margins. The base of the wound looks reasonable. I have aggressively debrided this many times down to levels around the wound with healthy looking tissue it simply does not work. I did not do this today there were no palpable deep structures Integumentary (Hair, Skin) Wound #9 status is Open. Original cause of wound was Blister. The date acquired was: 06/18/2018. The wound has been in treatment  63 weeks. The wound is located on the Right,Medial Foot. The wound measures 1.9cm length x 1.4cm width x 0.6cm depth; 2.089cm^2 area and 1.253cm^3 volume. There is Fat Layer (Subcutaneous Tissue) exposed. There is no tunneling or undermining noted. There is a medium amount of serosanguineous drainage noted. The wound margin is thickened. There is large (67-100%) pink, pale granulation within the wound bed. There is no necrotic tissue within the wound bed. General Notes: callous periwound. 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 Bathing/ Shower/ Hygiene: May shower with protection but do not get wound dressing(s) wet. Edema Control - Lymphedema / SCD / Other: Elevate legs to the level of the heart or above for 30 minutes daily and/or when sitting, a frequency of: - throughout the day Avoid standing for long periods of time. Patient to wear own compression stockings every day. - left leg daily Moisturize legs daily. - left leg daily Off-Loading: Open toe surgical shoe to: - Right Foot WOUND #9: - Foot Wound Laterality: Right, Medial Cleanser: Byram Ancillary Kit - 15 Day Supply (DME) (Generic)  Every Other Day/15 Days Discharge Instructions: Use supplies as instructed; Kit contains: (15) Saline Bullets; (15) 3x3 Gauze; 15 pr Gloves Cleanser: Soap and Water Every Other Day/15 Days Discharge Instructions: May shower and wash wound with dial antibacterial soap and water prior to dressing change. Peri-Wound Care: Sween Lotion (Moisturizing lotion) Every Other Day/15 Days Discharge Instructions: Apply moisturizing lotion as directed Prim Dressing: Promogran Prisma Matrix, 4.34 (sq in) (silver collagen) (DME) (Generic) Every Other Day/15 Days ary Discharge Instructions: Moisten collagen with saline or hydrogel Secondary Dressing: Woven Gauze Sponge, Non-Sterile 4x4 in (Generic) Every Other Day/15 Days Discharge Instructions: Apply over primary dressing as directed. Secondary Dressing: ABD Pad, 5x9 (DME) Every Other Day/15 Days Discharge Instructions: Apply over primary dressing as directed. Secondary Dressing: Felt 2.5 yds x 5.5 in Every Other Day/15 Days Discharge Instructions: Apply over primary dressing cut to form callous pad around wound Secured With: Kerlix Roll Sterile, 4.5x3.1 (in/yd) (DME) (Generic) Every Other Day/15 Days Discharge Instructions: Secure with Kerlix as directed. Secured With: Paper T ape, 2x10 (in/yd) (DME) (Generic) Every Other Day/15 Days Discharge Instructions: Secure dressing with tape as directed. 1. I will continue to watch this area every 2 weeks. 2. Preparing for surgery in a month. 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 3. She was told that she will have to be off the foot for about 3 months postop Electronic Signature(s) Signed: 04/13/2020 5:22:38 PM By: Linton Ham MD Entered By: Linton Ham on 04/13/2020 14:17:53 -------------------------------------------------------------------------------- SuperBill Details Patient Name: Date of Service: [name], [name]  04/13/2020 Medical Record Number: [id number] Patient Account Number: [id number] Date of Birth/Sex: Treating RN: 15-Oct-1955 (65 y.o. [name] Primary Care Provider: MA SO UDI, [place] MA LSA DA T Other Clinician: Referring Provider: Treating Provider/Extender: Marlynn Perking, ELHA MA LSA DA T Weeks in Treatment: 59 Diagnosis Coding ICD-10 Codes Code Description E11.621 Type 2 diabetes mellitus with foot ulcer L97.511 Non-pressure chronic ulcer of other part of right foot limited to breakdown of skin E11.42 Type 2 diabetes mellitus with diabetic polyneuropathy M14.671 Charcot's joint, right ankle and foot Facility Procedures CPT4 Code: 13244010 Description: [phone] -  WOUND CARE VISIT-LEV 3 EST PT Modifier: Quantity: 1 Physician Procedures : CPT4 Code Description Modifier [id number] - WC PHYS LEVEL 3 - EST PT ICD-10 Diagnosis Description L97.511 Non-pressure chronic ulcer of other part of right foot limited to breakdown of skin E11.621 Type 2 diabetes mellitus with foot ulcer Quantity: 1 Electronic Signature(s) Signed: 04/13/2020 5:22:38 PM By: Linton Ham MD Signed: 04/13/2020 5:28:50 PM By: Levan Hurst RN, BSN Entered By: Levan Hurst on 04/13/2020 16:46:43

## 2020-04-15 NOTE — Progress Notes (Signed)
[name], [name] ([id number]) Visit Report for 04/13/2020 Arrival Information Details Patient Name: Date of Service: Sarah, [name] 04/13/2020 1:45 PM Medical Record Number: [id number] Patient Account Number: [id number] Date of Birth/Sex: Treating RN: [birth year] (65 y.o. [name] Primary Care [name]: MA SO UDI, [place] MA LSA DA T Other Clinician: Referring [name]: Treating [name]/Extender: Linton Ham MA SO UDI, ELHA MA LSA DA T Weeks in Treatment: 75 Visit Information History Since Last Visit Added or deleted any medications: No Patient Arrived: Crutches Any new allergies or adverse reactions: No Arrival Time: 13:48 Had a fall or experienced change in No Accompanied By: self activities of daily living that may affect Transfer Assistance: None risk of falls: Patient Identification Verified: Yes Signs or symptoms of abuse/neglect since No Secondary Verification Process Completed: Yes last visito Patient Requires Transmission-Based Precautions: No Hospitalized since last visit: No Patient Has Alerts: No Implantable device outside of the clinic No excluding cellular tissue based products placed in the center since last visit: Has Dressing in Place as Prescribed: Yes Has Footwear/Offloading in Place as Yes Prescribed: Right: Surgical Shoe with Pressure Relief Insole Pain Present Now: No Electronic Signature(s) Signed: 04/13/2020 5:16:32 PM By: Deon Pilling Entered By: Deon Pilling on 04/13/2020 13:55:44 -------------------------------------------------------------------------------- Clinic Level of Care Assessment Details Patient Name: Date of Service: Sarah, [name] 04/13/2020 1:45 PM Medical Record Number: [id number] Patient Account Number: [id number] Date of Birth/Sex: Treating RN: 08/28/1955 (66 y.o. [name] Primary Care Aarsh Fristoe: MA SO UDI, ELHA MA LSA DA T Other Clinician: Referring [name]: Treating [name]/Extender: Linton Ham MA SO UDI, ELHA MA LSA DA T Weeks in Treatment: 21 Clinic Level of Care Assessment Items TOOL 4 Quantity Score X- 1 0 Use when only an EandM is performed on FOLLOW-UP visit ASSESSMENTS - Nursing Assessment / Reassessment X- 1 10 Reassessment of Co-morbidities (includes updates in patient status) X- 1 5 Reassessment of Adherence to Treatment Plan ASSESSMENTS - Wound and Skin A ssessment / Reassessment X - Simple Wound Assessment / Reassessment - one wound 1 5 [] - 0 Complex Wound Assessment / Reassessment - multiple wounds [] - 0 Dermatologic / Skin Assessment (not related to wound area) ASSESSMENTS - Focused Assessment [] - 0 Circumferential Edema Measurements - multi extremities [] - 0 Nutritional Assessment / Counseling / Intervention X- 1 5 Lower Extremity Assessment (monofilament, tuning fork, pulses) [] - 0 Peripheral Arterial Disease Assessment (using hand held doppler) ASSESSMENTS - Ostomy and/or Continence Assessment and Care [] - 0 Incontinence Assessment and Management [] - 0 Ostomy Care Assessment and Management (repouching, etc.) PROCESS - Coordination of Care X - Simple Patient / Family Education for ongoing care 1 15 [] - 0 Complex (extensive) Patient / Family Education for ongoing care X- 1 10 Staff obtains Programmer, systems, Records, T Results / Process Orders est [] - 0 Staff telephones HHA, Nursing Homes / Clarify orders / etc [] - 0 Routine Transfer to another Facility (non-emergent condition) [] - 0 Routine [name] Admission (non-emergent condition) [] - 0 New Admissions / Biomedical engineer / Ordering NPWT Apligraf, etc. , [] - 0 Emergency [name] Admission (emergent condition) X- 1 10 Simple Discharge Coordination [] - 0 Complex (extensive) Discharge Coordination PROCESS - Special Needs [] - 0 Pediatric / Minor Patient Management [] - 0 Isolation Patient Management [] - 0 Hearing / Language / Visual special needs [] -  0 Assessment of Community assistance (transportation, D/C planning, etc.) [] - 0 Additional assistance / Altered  mentation [] - 0 Support Surface(s) Assessment (bed, cushion, seat, etc.) INTERVENTIONS - Wound Cleansing / Measurement X - Simple Wound Cleansing - one wound 1 5 [] - 0 Complex Wound Cleansing - multiple wounds X- 1 5 Wound Imaging (photographs - any number of wounds) [] - 0 Wound Tracing (instead of photographs) X- 1 5 Simple Wound Measurement - one wound [] - 0 Complex Wound Measurement - multiple wounds INTERVENTIONS - Wound Dressings [] - 0 Small Wound Dressing one or multiple wounds X- 1 15 Medium Wound Dressing one or multiple wounds [] - 0 Large Wound Dressing one or multiple wounds X- 1 5 Application of Medications - topical [] - 0 Application of Medications - injection INTERVENTIONS - Miscellaneous [] - 0 External ear exam [] - 0 Specimen Collection (cultures, biopsies, blood, body fluids, etc.) [] - 0 Specimen(s) / Culture(s) sent or taken to Lab for analysis [] - 0 Patient Transfer (multiple staff / Civil Service fast streamer / Similar devices) [] - 0 Simple Staple / Suture removal (25 or less) [] - 0 Complex Staple / Suture removal (26 or more) [] - 0 Hypo / Hyperglycemic Management (close monitor of Blood Glucose) [] - 0 Ankle / Brachial Index (ABI) - do not check if billed separately X- 1 5 Vital Signs Has the patient been seen at the [name] within the last three years: Yes Total Score: 100 Level Of Care: New/Established - Level 3 Electronic Signature(s) Signed: 04/13/2020 5:28:50 PM By: Sarah Hurst RN, BSN Entered By: [name] on 04/13/2020 16:46:37 -------------------------------------------------------------------------------- Encounter Discharge Information Details Patient Name: Date of Service: [name], [name] 04/13/2020 1:45 PM Medical Record Number: [id number] Patient Account Number: [id number] Date of Birth/Sex: Treating  RN: [birth year] (65 y.o. [name] Primary Care Provider: MA SO UDI, [place] MA LSA DA T Other Clinician: Referring Provider: Treating Provider/Extender: Linton Ham MA SO UDI, ELHA MA LSA DA T Weeks in Treatment: 81 Encounter Discharge Information Items Discharge Condition: Stable Ambulatory Status: Crutches Discharge Destination: Home Transportation: Private Auto Accompanied By: self Schedule Follow-up Appointment: Yes Clinical Summary of Care: Electronic Signature(s) Signed: 04/13/2020 5:16:32 PM By: Deon Pilling Entered By: Deon Pilling on 04/13/2020 14:30:25 -------------------------------------------------------------------------------- Lower Extremity Assessment Details Patient Name: Date of Service: Sarah, [name] 04/13/2020 1:45 PM Medical Record Number: [id number] Patient Account Number: [id number] Date of Birth/Sex: Treating RN: 1955-12-16 (65 y.o. [name] Primary Care Provider: MA SO UDI, [place] MA LSA DA T Other Clinician: Referring Provider: Treating Provider/Extender: Linton Ham MA SO UDI, ELHA MA LSA DA T Weeks in Treatment: 71 Edema Assessment Assessed: [Left: No] [Right: Yes] Edema: [Left: N] [Right: o] Calf Left: Right: Point of Measurement: 43 cm From Medial Instep 35 cm Ankle Left: Right: Point of Measurement: 12 cm From Medial Instep 25 cm Vascular Assessment Pulses: Dorsalis Pedis Palpable: [Right:Yes] Electronic Signature(s) Signed: 04/13/2020 5:16:32 PM By: Deon Pilling Entered By: Deon Pilling on 04/13/2020 13:56:39 -------------------------------------------------------------------------------- Multi Wound Chart Details Patient Name: Date of Service: [name], [name] 04/13/2020 1:45 PM Medical Record Number: [id number] Patient Account Number: [id number] Date of Birth/Sex: Treating RN: January 25, 1955 (64 y.o. [name] Primary Care Provider: MA SO UDI, [place] MA LSA DA T Other Clinician: Referring  Provider: Treating Provider/Extender: Linton Ham MA SO UDI, ELHA MA LSA DA T Weeks in Treatment: 13 Vital Signs Height(in): 68 Capillary Blood Glucose(mg/dl): 130 Weight(lbs): 265 Pulse(bpm): 77 Body Mass Index(BMI): 40 Blood Pressure(mmHg): 121/73 Temperature(F): 97.9 Respiratory Rate(breaths/min): 18 Photos: [9:No Photos Right,  Medial Foot] [N/A:N/A N/A] Wound Location: [9:Blister] [N/A:N/A] Wounding Event: [9:Diabetic Wound/Ulcer of the Lower] [N/A:N/A] Primary Etiology: [9:Extremity Glaucoma, Chronic sinus] [N/A:N/A] Comorbid History: [9:problems/congestion, Anemia, Sleep Apnea, Hypertension, Type II Diabetes, Rheumatoid Arthritis, Neuropathy 06/18/2018] [N/A:N/A] Date Acquired: [9:63] [N/A:N/A] Weeks of Treatment: [9:Open] [N/A:N/A] Wound Status: [9:1.9x1.4x0.6] [N/A:N/A] Measurements L x W x D (cm) [9:2.089] [N/A:N/A] A (cm) : rea [9:1.253] [N/A:N/A] Volume (cm) : [9:81.00%] [N/A:N/A] % Reduction in A rea: [9:87.30%] [N/A:N/A] % Reduction in Volume: [9:Grade 2] [N/A:N/A] Classification: [9:Medium] [N/A:N/A] Exudate A mount: [9:Serosanguineous] [N/A:N/A] Exudate Type: [9:red, brown] [N/A:N/A] Exudate Color: [9:Thickened] [N/A:N/A] Wound Margin: [9:Large (67-100%)] [N/A:N/A] Granulation A mount: [9:Pink, Pale] [N/A:N/A] Granulation Quality: [9:None Present (0%)] [N/A:N/A] Necrotic A mount: [9:Fat Layer (Subcutaneous Tissue): Yes N/A] Exposed Structures: [9:Fascia: No Tendon: No Muscle: No Joint: No Bone: No Small (1-33%)] [N/A:N/A] Epithelialization: [9:callous periwound.] [N/A:N/A] Treatment Notes Electronic Signature(s) Signed: 04/13/2020 5:22:38 PM By: Linton Ham MD Signed: 04/13/2020 5:28:50 PM By: Sarah Hurst RN, BSN Entered By: Linton Ham on 04/13/2020 14:15:19 -------------------------------------------------------------------------------- Multi-Disciplinary Care Plan Details Patient Name: Date of Service: [name], [name] 04/13/2020 1:45  PM Medical Record Number: [id number] Patient Account Number: [id number] Date of Birth/Sex: Treating RN: 06/04/1955 (65 y.o. [name] Primary Care Shalisa Mcquade: MA SO UDI, [place] MA LSA DA T Other Clinician: Referring Karlye Ihrig: Treating Raeleigh Guinn/Extender: Linton Ham MA SO UDI, ELHA MA LSA DA T Weeks in Treatment: 66 [place] reviewed with physician Active Inactive Wound/Skin Impairment Nursing Diagnoses: Impaired tissue integrity Knowledge deficit related to ulceration/compromised skin integrity Goals: Patient/caregiver will verbalize understanding of skin care regimen Date Initiated: 01/28/2019 Target Resolution Date: 05/04/2020 Goal Status: Active Interventions: Assess patient/caregiver ability to obtain necessary supplies Assess patient/caregiver ability to perform ulcer/skin care regimen upon admission and as needed Assess ulceration(s) every visit Provide education on ulcer and skin care Notes: Electronic Signature(s) Signed: 04/13/2020 5:28:50 PM By: Sarah Hurst RN, BSN Entered By: [name] on 04/13/2020 14:10:54 -------------------------------------------------------------------------------- Pain Assessment Details Patient Name: Date of Service: [name], [name] 04/13/2020 1:45 PM Medical Record Number: [id number] Patient Account Number: [id number] Date of Birth/Sex: Treating RN: 1955/08/24 (65 y.o. [name] Primary Care Maha Fischel: MA SO UDI, [place] MA LSA DA T Other Clinician: Referring [place]: Treating Raybon Conard/Extender: Linton Ham MA SO UDI, ELHA MA LSA DA T Weeks in Treatment: 84 Active Problems Location of Pain Severity and Description of Pain Patient Has Paino No Site Locations Rate the pain. Rate the pain. Current Pain Level: 0 Pain Management and Medication Current Pain Management: Medication: No Cold Application: No Rest: No Massage: No Activity: No T.E.N.S.: No Heat Application: No Leg drop or  elevation: No Is the Current Pain Management Adequate: Adequate How does your wound impact your activities of daily livingo Sleep: No Bathing: No Appetite: No Relationship With Others: No Bladder Continence: No Emotions: No Bowel Continence: No Work: No Toileting: No Drive: No Dressing: No Hobbies: No Electronic Signature(s) Signed: 04/13/2020 5:16:32 PM By: Deon Pilling Entered By: Deon Pilling on 04/13/2020 13:56:28 -------------------------------------------------------------------------------- Patient/Caregiver Education Details Patient Name: Date of Service: [name] 3/28/2022andnbsp1:45 PM Medical Record Number: [id number] Patient Account Number: [id number] Date of Birth/Gender: Treating RN: 1955/02/24 (65 y.o. [name] Primary Care Physician: MA SO UDI, [place] MA LSA DA T Other Clinician: Referring Physician: Treating Physician/Extender: Linton Ham MA SO UDI, ELHA MA LSA DA T Weeks in Treatment: 49 Education Assessment Education Provided To: Patient Education Topics Provided Wound/Skin Impairment: Methods: Explain/Verbal Responses: State content correctly Electronic  Signature(s) Signed: 04/13/2020 5:28:50 PM By: Sarah Hurst RN, BSN Entered By: [name] on 04/13/2020 14:11:20 -------------------------------------------------------------------------------- Wound Assessment Details Patient Name: Date of Service: [name], [name] 04/13/2020 1:45 PM Medical Record Number: [id number] Patient Account Number: [id number] Date of Birth/Sex: Treating RN: 1955/05/09 (65 y.o. [name] Primary Care Micky Sheller: MA SO UDI, [place] MA LSA DA T Other Clinician: Referring Taliah Porche: Treating Kahdijah Errickson/Extender: Linton Ham MA SO UDI, ELHA MA LSA DA T Weeks in Treatment: 94 Wound Status Wound Number: 9 Primary Diabetic Wound/Ulcer of the Lower Extremity Etiology: Wound Location: Right, Medial Foot Wound Open Wounding Event:  Blister Status: Date Acquired: 06/18/2018 Comorbid Glaucoma, Chronic sinus problems/congestion, Anemia, Sleep Weeks Of Treatment: 63 History: Apnea, Hypertension, Type II Diabetes, Rheumatoid Arthritis, Clustered Wound: No Neuropathy Photos Wound Measurements Length: (cm) 1.9 Width: (cm) 1.4 Depth: (cm) 0.6 Area: (cm) 2.089 Volume: (cm) 1.253 % Reduction in Area: 81% % Reduction in Volume: 87.3% Epithelialization: Small (1-33%) Tunneling: No Undermining: No Wound Description Classification: Grade 2 Wound Margin: Thickened Exudate Amount: Medium Exudate Type: Serosanguineous Exudate Color: red, brown Foul Odor After Cleansing: No Slough/Fibrino No Wound Bed Granulation Amount: Large (67-100%) Exposed Structure Granulation Quality: Pink, Pale Fascia Exposed: No Necrotic Amount: None Present (0%) Fat Layer (Subcutaneous Tissue) Exposed: Yes Tendon Exposed: No Muscle Exposed: No Joint Exposed: No Bone Exposed: No Assessment Notes callous periwound. Treatment Notes Wound #9 (Foot) Wound Laterality: Right, Medial Cleanser Byram Ancillary Kit - 15 Day Supply Discharge Instruction: Use supplies as instructed; Kit contains: (15) Saline Bullets; (15) 3x3 Gauze; 15 pr Gloves Soap and Water Discharge Instruction: May shower and wash wound with dial antibacterial soap and water prior to dressing change. Peri-Wound Care Sween Lotion (Moisturizing lotion) Discharge Instruction: Apply moisturizing lotion as directed Topical Primary Dressing Promogran Prisma Matrix, 4.34 (sq in) (silver collagen) Discharge Instruction: Moisten collagen with saline or hydrogel Secondary Dressing Woven Gauze Sponge, Non-Sterile 4x4 in Discharge Instruction: Apply over primary dressing as directed. ABD Pad, 5x9 Discharge Instruction: Apply over primary dressing as directed. Felt 2.5 yds x 5.5 in Discharge Instruction: Apply over primary dressing cut to form callous pad around wound Secured  With Kerlix Roll Sterile, 4.5x3.1 (in/yd) Discharge Instruction: Secure with Kerlix as directed. Paper Tape, 2x10 (in/yd) Discharge Instruction: Secure dressing with tape as directed. Compression Wrap Compression Stockings Add-Ons Electronic Signature(s) Signed: 04/13/2020 5:16:32 PM By: Deon Pilling Signed: 04/15/2020 9:05:34 AM By: Sandre Kitty Entered By: Sandre Kitty on 04/13/2020 16:29:20 -------------------------------------------------------------------------------- Vitals Details Patient Name: Date of Service: [name], [name] 04/13/2020 1:45 PM Medical Record Number: [id number] Patient Account Number: [id number] Date of Birth/Sex: Treating RN: 03-02-1955 (65 y.o. [name] Primary Care Andreal Vultaggio: MA SO UDI, [place] MA LSA DA T Other Clinician: Referring Eilee Schader: Treating Cameran Ahmed/Extender: Linton Ham MA SO UDI, ELHA MA LSA DA T Weeks in Treatment: 15 Vital Signs Time Taken: 13:48 Temperature (F): 97.9 Height (in): 68 Pulse (bpm): 77 Weight (lbs): 265 Respiratory Rate (breaths/min): 18 Body Mass Index (BMI): 40.3 Blood Pressure (mmHg): 121/73 Capillary Blood Glucose (mg/dl): 130 Reference Range: 80 - 120 mg / dl Electronic Signature(s) Signed: 04/13/2020 5:16:32 PM By: Deon Pilling Entered By: Deon Pilling on 04/13/2020 13:56:15

## 2020-04-22 NOTE — Progress Notes (Signed)
   CC: Preoperative evaluation  HPI:  [name] is a 65 y.o. with a medical history listed below presenting for preoperative evaluation prior to her right foot arthrodesis/heel cord lengthening surgery.For details of today's visit and the status of his chronic medical issues please refer to the assessment and plan.   Past Medical History:  Diagnosis Date  . Chronic normocytic anemia   . CKD (chronic kidney disease) stage 3, GFR 30-59 ml/min (HCC) 02/02/2013  . Diabetes mellitus type II, controlled (HCC) 06/17/2009  . Diabetic ulcer of both feet (HCC) 01/07/2010   Qualifier: Diagnosis of  By: [name] MD, Amanjot    . Essential hypertension 06/17/2009  . GERD (gastroesophageal reflux disease)   . Glaucoma   . H/O hiatal hernia   . Hyperlipidemia   . MRSA (methicillin resistant Staphylococcus aureus)   . Obstructive sleep apnea 05/26/2010   Review of Systems:   Review of Systems  Constitutional: Negative for chills and fever.  Respiratory: Negative for shortness of breath.   Cardiovascular: Negative for chest pain.  Gastrointestinal: Negative for abdominal pain, nausea and vomiting.     Physical Exam:  Vitals:   04/23/20 0841  BP: (!) 106/45  Pulse: 71  Temp: 98.4 F (36.9 C)  TempSrc: Oral  SpO2: 100%   Physical Exam Vitals reviewed.  Constitutional:      General: She is not in acute distress.    Appearance: Normal appearance. She is not ill-appearing.  Cardiovascular:     Rate and Rhythm: Normal rate and regular rhythm.     Pulses: Normal pulses.     Heart sounds: Normal heart sounds. No murmur heard. No friction rub. No gallop.   Pulmonary:     Effort: Pulmonary effort is normal. No respiratory distress.     Breath sounds: Normal breath sounds. No wheezing or rales.  Abdominal:     General: Abdomen is flat. Bowel sounds are normal. There is no distension.     Palpations: Abdomen is soft.     Tenderness: There is no abdominal tenderness. There is no  guarding.  Musculoskeletal:     Right lower leg: No edema.     Left lower leg: No edema.  Skin:    General: Skin is warm and dry.  Neurological:     Mental Status: She is alert and oriented to person, place, and time.  Psychiatric:        Mood and Affect: Mood normal.        Behavior: Behavior normal.        Thought Content: Thought content normal.        Judgment: Judgment normal.     Assessment & Plan:   See Encounters Tab for problem based charting.  Patient discussed with Dr. Sandre Kitty

## 2020-04-23 ENCOUNTER — Ambulatory Visit (HOSPITAL_COMMUNITY)
Admission: RE | Admit: 2020-04-23 | Discharge: 2020-04-23 | Disposition: A | Discharge status: Home / Self Care | Payer: Medicare Other | Source: Ambulatory Visit | Attending: Internal Medicine | Admitting: Internal Medicine

## 2020-04-23 ENCOUNTER — Other Ambulatory Visit: Payer: Self-pay

## 2020-04-23 ENCOUNTER — Ambulatory Visit (INDEPENDENT_AMBULATORY_CARE_PROVIDER_SITE_OTHER): Payer: Medicare Other | Admitting: Internal Medicine

## 2020-04-23 ENCOUNTER — Encounter: Payer: Self-pay | Admitting: Internal Medicine

## 2020-04-23 VITALS — BP 106/45 | HR 71 | Temp 98.4°F

## 2020-04-23 DIAGNOSIS — Z01810 Encounter for preprocedural cardiovascular examination: Secondary | ICD-10-CM | POA: Diagnosis not present

## 2020-04-23 DIAGNOSIS — Z01818 Encounter for other preprocedural examination: Secondary | ICD-10-CM

## 2020-04-23 DIAGNOSIS — E1169 Type 2 diabetes mellitus with other specified complication: Secondary | ICD-10-CM | POA: Diagnosis not present

## 2020-04-23 NOTE — Assessment & Plan Note (Signed)
Patient presents for preoperative evaluation for her right foot arthrodesis/heel cord lengthening surgery.  She denies any complaints at this time.  Denies chest pain, shortness of breath, recent illnesses or signs or symptoms of infection.  No history of cardiovascular events.  Her hemoglobin A1c is checked in February, 6.3.  Blood pressure is well controlled on today's visit.  Renal function was also checked a few months ago and was stable.  Patient's RCRI score was 0.  She is a very low risk for postoperative complications.  Discussed risks of surgery with patient.  Plan to get an EKG today, last EKG on record is from 2016.  From our standpoint, patient has no contraindications for the surgery.  Plan: EKG No contraindications for surgery

## 2020-04-23 NOTE — Patient Instructions (Addendum)
It was a pleasure meeting you today!  Today we discussed your upcoming surgery and your preoperative evaluation. We obtained an EKG. You had lab work recently that looked great. From our standpoint, you are ready for surgery! We wish you the best and will plan to follow up after!  Thanks for allowing [place] to be a part of your care!

## 2020-04-23 NOTE — Assessment & Plan Note (Signed)
Last hemoglobin A1c checked March 11, 2020 was 6.3.  Patient is on Janumet 50-500 mg twice daily.  Diabetes seems under good control at this time.  Discussed the importance of this especially postoperatively for wound healing.  Patient expresses understanding.    Plan: Continue Janumet 50-500 mg twice daily

## 2020-04-24 NOTE — Progress Notes (Signed)
[facility] Attending  Case discussed with Dr. Rehman at the time of the visit.  We reviewed the resident's history and exam and pertinent patient test results.  I agree with the assessment, diagnosis, and plan of care documented in the resident's note.  Ociel Retherford, M.D., Ph.D.  

## 2020-04-27 ENCOUNTER — Other Ambulatory Visit (HOSPITAL_COMMUNITY): Payer: Self-pay | Admitting: Orthopedic Surgery

## 2020-04-27 ENCOUNTER — Encounter (HOSPITAL_BASED_OUTPATIENT_CLINIC_OR_DEPARTMENT_OTHER): Payer: Medicare Other | Attending: Internal Medicine | Admitting: Internal Medicine

## 2020-04-27 ENCOUNTER — Other Ambulatory Visit: Payer: Self-pay

## 2020-04-27 DIAGNOSIS — E1151 Type 2 diabetes mellitus with diabetic peripheral angiopathy without gangrene: Secondary | ICD-10-CM | POA: Insufficient documentation

## 2020-04-27 DIAGNOSIS — E1142 Type 2 diabetes mellitus with diabetic polyneuropathy: Secondary | ICD-10-CM | POA: Insufficient documentation

## 2020-04-27 DIAGNOSIS — E1122 Type 2 diabetes mellitus with diabetic chronic kidney disease: Secondary | ICD-10-CM | POA: Diagnosis not present

## 2020-04-27 DIAGNOSIS — E11610 Type 2 diabetes mellitus with diabetic neuropathic arthropathy: Secondary | ICD-10-CM | POA: Insufficient documentation

## 2020-04-27 DIAGNOSIS — L97511 Non-pressure chronic ulcer of other part of right foot limited to breakdown of skin: Secondary | ICD-10-CM | POA: Diagnosis not present

## 2020-04-27 DIAGNOSIS — E11621 Type 2 diabetes mellitus with foot ulcer: Secondary | ICD-10-CM | POA: Insufficient documentation

## 2020-04-27 DIAGNOSIS — M069 Rheumatoid arthritis, unspecified: Secondary | ICD-10-CM | POA: Insufficient documentation

## 2020-04-27 DIAGNOSIS — I129 Hypertensive chronic kidney disease with stage 1 through stage 4 chronic kidney disease, or unspecified chronic kidney disease: Secondary | ICD-10-CM | POA: Diagnosis not present

## 2020-04-27 DIAGNOSIS — N183 Chronic kidney disease, stage 3 unspecified: Secondary | ICD-10-CM | POA: Insufficient documentation

## 2020-04-27 DIAGNOSIS — K219 Gastro-esophageal reflux disease without esophagitis: Secondary | ICD-10-CM | POA: Diagnosis not present

## 2020-04-28 NOTE — Progress Notes (Signed)
LORILEI, HORAN ([id number]) Visit Report for 04/27/2020 HPI Details Patient Name: Date of Service: [name], HANEL 04/27/2020 1:45 PM Medical Record Number: [id number] Patient Account Number: [id number] Date of Birth/Sex: Treating RN: [birth year] (65 y.o. [name] Primary Care Provider: MA SO UDI, [place] MA LSA DA T Other Clinician: Referring Provider: Treating Provider/Extender: Linton Ham MA SO UDI, ELHA MA LSA DA T Weeks in Treatment: 9 History of Present Illness HPI Description: 65 yrs old bf here for follow up recurrent left charcot foot ulcer. She has DM. She is not a smoker. She states a blister developed 3 months ago at site of previous breakdown from 1 year ago. It was debrided at the podiatrist's office. She went to the ER and then sent here. She denies pain. Xray was negative for osteo. Culture from this clinic negative. 09/08/14 [name] to [name] for Charcot foot. Seen by him and MRI scheduled 09/19/14. Prior silver collagen but this was not ordered last visit, patient has been rinsing with saline alone. Re institute silver collagen every other day. F/u 2 weeks with Dr. Dellia Nims as Labor Day holiday. Supplies ordered. 09/25/14; this is a patient with a Charcot foot on the left. she has a wound over the plantar aspect of her left calcaneus. She has been seen by [name] of orthopedic surgery and has had an MRI. She is apparently going within the next week or 2 for corrective surgery which will involve an external fixator. I don't have any of the details of this. 10/06/14; The patient is a 65 yrs old bf with a left Charcot foot and ulcer on the plantar. 12/01/14 Returns post surgery with [name]. Has follow up visit later this week with him, currently in facility and NWB over this extremity. States no drainage from foot and no current wound care. On exam callus and scab in place, suspect healed. Has external fixator in place. Will defer to Dr.  [name] for management, ok to place moisturizing cream over scabs and callus and she will call if she requires follow up here. READMISSION 01/28/2019 Patient was in the clinic in 2016 for a wound on the plantar aspect of her left calcaneus. Predominantly cared for by Dr. [name] she was referred to [name] and had corrective surgery for the position of her left ankle I believe. She had previously been here in 2015 and then prior to that in 2011 2012 that I do not have these records. More recently she has developed fairly extensive wounds on the right medial foot, left lateral foot and a small area on the right medial great toe. Right medial foot wound has been there for 6 to 7 months, left foot wound for 2 to 3 months and the right great toe only over the last few weeks. She has been followed by [name] at Dr. [name] office it sounds as though she has been undergoing callus paring and silver nitrate. The patient has been washing these off with soap and water and plan applying dry dressing Past medical history, type 2 diabetes well controlled with a recent hemoglobin A1c of 7 on 11/16, type 2 diabetes with peripheral neuropathy, previous left Charcot joint surgery bilateral Charcot joints currently, stage III chronic renal failure, hypertension, obstructive sleep apnea, history of MRSA and gastroesophageal reflux. 1/18; this is a patient with bilateral Charcot feet. Plain x-rays that I did of both of these wounds that are either at bone or precariously close to bone  were done. On the right foot this showed possible lytic destruction involving the anterior aspect of the talus which may represent a wound osteomyelitis. An MRI was recommended. On the left there was no definite lytic destruction although the wound is deep undermining's and I think probably requires an MRI on this basis in any case 1/25; patient was supposed to have her MRI last Friday of her bilateral feet however they would  not do it because there was silver alginate in her dressings, will replace with calcium alginate today and will see if we can get her done today 2/1; patient did not get her MRIs done last week because of issues with the MRI department receiving orders to do bilateral feet. She has 2 deep wounds in the setting of Charcot feet deformities bilaterally. Both of them at one point had exposed bone although I had trouble demonstrating that today. I am not sure that we can offload these very aggressively as I watched her leave the clinic last week her gait is very narrow and unsteady. 2/15; the MRI of her right foot did not show osteomyelitis potentially osteonecrosis of the talus. 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 The wounds are a large wound on the right medial foot and on the left a substantial wound on the left lateral plantar calcaneus. We have been using silver alginate. Ultimately I think we need to consider a total contact cast on the right while we work through the possibility of osteomyelitis in the left. I watched her walk out with her crutches I have some trepidation about the ability to walk with the cast on her right foot. Nevertheless with her Charcot deformities I do not think there is a way to heal these short of offloading them aggressively. 2/22; the culture of the bone in the left foot that I did last week showed a few Citrobacter Koseri as well as some streptococcal species. In my attempted bone for pathology did not actually yield a lot of bone the pathologist did not identify any osteomyelitis. 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 Is promised today and aggressive debridement of the right foot and a total contact cast which surprisingly she seemed to tolerate quite well 03/13/2019  upon evaluation today patient appears to be doing well with her total contact cast she is here for the first cast change. She had no areas of rubbing or any other complications at this point. Overall things are doing quite well. 2/26; the patient was kindly seen by Dr. Megan Salon of infectious disease on 2/25. She is now on IV vancomycin PICC line placement in 2 days and oral Flagyl. This was in response to her MRI showing osteomyelitis of the left calcaneus concerning for osteomyelitis. We have been putting her right leg in a total contact cast. Our nurses report drainage. She is tolerating the total contact cast well. 3/4; the patient is started her IV antibiotics and is taking IV vancomycin and oral Flagyl. She appears to be tolerating these both well. This is for osteomyelitis involving the left calcaneus. The area on the right in the setting of bilateral Charcot deformities did not have any bone infection on MRI. We put a total contact cast on her with silver alginate as the primary dressing and she returns today in follow-up. Per  our intake nurse there was too much drainage to leave this on all week therefore we will bring her back for an early change 3/8; follow-up total contact cast she is still having a lot of drainage probably too much drainage from the right foot wound will week with the same cast. She will be back on Thursday. The area on the left looks somewhat better 3/11; patient here for a total contact cast change. 3/15; patient came in for wound care evaluation. She is still on IV vancomycin and oral Flagyl for osteomyelitis in the left foot. The area on the right foot we are putting in a total contact cast. 3/18 for total contact cast change on the right foot 3/22; the patient was here for wound review today. The area on the right foot is slightly smaller in terms of surface area. However the surface of the wound is very gritty and hyper granulated. More problematically the area on  the plantar left foot has increased depth indeed in the center of this it probes to bone. We have been using Hydrofera Blue in both areas. She is on antibiotics as prescribed by infectious disease IV vancomycin and oral Flagyl 3/25; the patient is in for total contact cast change. Our intake nurse was concerned about the amount of drainage which soaked right to the multiple layers we are putting on this. Wondered about options. The wound bed actually looks as healthy as this is looked although there may not be a lot of change in dimensions things are looking a lot better. If we are going to heal this woman's wound which is in the middle of her right Charcot foot this is going to need to be offloaded. There are not many alternatives 3/29; still not a lot of improvement although the surface of the right wound looks better. We have been using Hydrofera Blue 04/17/2019 upon evaluation today patient appears to be doing well with a total contact cast. With actually having to change this twice a week. She saw Dr. Dellia Nims for wound care earlier in the week this is for the cast change today. That is due to the fact that she has significant drainage. Overall the wound is been looking excellent however. 4/5; we continue to put a total contact cast on this patient with Hydrofera Blue. Still a lot of drainage which precludes a simple weekly change or changing this twice a week. 4/8; total contact cast changed today. Apparently the wound looks better per our intake nurse 4/12; patient here for wound care evaluation. Wounds on the right foot have not changed in dimension none about a month left foot looks similar. Apparently her antibiotics that we are giving her for osteomyelitis in the left foot complete on Wednesday. We have been using Hydrofera Blue on the right foot under a total contact cast. Nurses report greenish drainage although I think this may be discoloration from the [facility] 4/15; back for cast  change wounds look quite good although still moderate amount of drainage. T contact cast reapplied otal 4/19;. Wound evaluation. Wound on the right foot is smaller surface looks healthy. There is still the divot in the middle part of this wound but I could not feel any bone. Area on the left foot also looks better She has completed her antibiotics but still has the PICC line in. 4/23; patient comes back for a total contact cast change this was done in the standard fashion no issues 4/26; wound measures slightly larger on the right  foot which is disappointing. She sees Dr. Megan Salon with regards to her antibiotics on Wednesday. I believe she is on vancomycin IV and oral Flagyl. 4/29; in for her routine total contact cast change. She saw Dr. Megan Salon this morning. He is asking about antibiotic continuation I will be in touch with him. I did not look at her wound today 5/3; patient saw Dr. Megan Salon on 4/28. I did send him a note asking about the continuation of perhaps an oral regimen. I have not heard back. She still has an elevated sedimentation rate at 81. Her wounds are smaller. 5/6; in for her total contact cast change. We did not look at the wound today. She has seen Dr. Megan Salon and is now on oral Keflex and Flagyl. She seems to be tolerating these well 5/10; she had her PICC line removed. The wound on the right foot after some initial improvement has not made any improvement even with the total contact cast. Still having a lot of drainage. Likewise the area on the left foot really is not any better either 5/14; in for a cast change today. We will look at the right foot on Monday. If this is not making any progress I am going to try to cast the left foot. 5/17; the wound on the right foot is absolutely no different. I am going to put ointment on this area and change the cast of the left foot. We have been using silver alginate there 5/21; apparently the area on the right foot was better per our  intake nurse although I did not see it. We did a standard total contact cast change on the left foot we have been applying silver alginate to the wound here. 06/10/19-Area on the right foot per dimensions slightly better, the left foot has still some depth applying silver alginate to the wound with TCC 06/13/19-Patient here for Casting to Left foot 6/1; patient had the total contact cast were placed on the left foot. Noted some swelling in her right leg although the dimensions of the wound on the right foot are better. We have been using PolyMem on the right and silver alginate on the left under the cast 6/7; we continue to make nice progress in both her plantar feet wound. We are putting the total contact cast on the left. Surprisingly in spite of this the area on the right medial foot in the middle of her Charcot deformities actually is making progress as well 07/01/2019 upon evaluation today patient actually appears to be doing quite well with regard to her wounds on the feet compared to when I saw her last that has been quite some time. With that being said I do believe the total contact cast has been beneficial in this left foot and she has a lot of signs of improvement she does have some callus overhanging which is caused a little bit of a ledge and an issue here. I do believe that we may be able to do something to help in that regard. Fortunately otherwise the wound seems to be progressing quite nicely. 6/21; the patient's area on the left foot that we are currently casting is just about closed. The area on the right medial foot has a healthy looking surface but may be not changed as much from last week in terms of surface area. We are using silver alginate on the right and polymen Ag under the cast 6/28; only a small open area on the left foot remains. Using silver alginate  under a total contact cast. She has a diabetic shoe I have asked her to bring that for next week where this may be closed.  The area on the right foot with a Charcot deformity is also measuring slightly smaller. We are using PolyMem Ag here 7/6; left foot is totally closed. She does not require a total contact cast. She will graduate to her own diabetic shoe. The area on the right foot with a Charcot deformity measuring about the same. We have been using polymen under compression wraps. She did not do well in this area with a total contact cast 7/13; left foot remains intact. She is skillfully covering this area with foam and using her diabetic shoe. Slight area improvement of the right foot wound. Still rolled senescent looking edges. We have been using polymen 7/19; left foot remains intact. Wound measuring slightly smaller on the right foot in the middle of the Charcot deformity. We have been using PolyMem Ag 7/26; per the patient left foot remains intact. The wound on the right foot looks slightly smaller but dimensions are measuring the same. We have been using polymen Ag 8/9-Patient back after 2 weeks, wound of the right foot again looking slightly smaller, continuing to use PolyMem silver 8/16; patient has made some minor improvements in wound area on the right foot. Using polymen Ag. The left foot remains healed 8/23; continued slight improvement in dimensions. Rolled edges around the wound edge noted. We have been using polymen Ag. Patient offloading with a surgical shoe and crutches 8/30; no change in surface area. Thick rolled skin and subcutaneous tissue around the edges debris on the wound surface. We have been using polymen Ag initially with some improvement however stalled on the last 2 weeks in terms of surface area 9/13; surface area is smaller still using polymen Ag. Thick rolled skin and subcutaneous tissue around the edges requiring debridement. 9/20; again surface area is improved using polymen Ag however each week she develops thick rolled skin and subcutaneous tissue around the wound  edges requiring debridement. She is offloading this using crutches 9/27; we've been using PolyMem AG. Wound does not look any different from last week. Raised edges of callus skin and subcutaneous tissue around the wound bed. 10/5; we been using polymen Ag. No ability to put a total contact cast on today in the clinic. Wound is about the same still rolled edges of thick callus around the margins 10/18; we put her in a total contact cast last week things did not go well. Not only does the wound looked worse macerated angry with thick callus and some warmth around it. She had another wound above this and one on the anterior right leg. I am not totally certain why this happened. She has not been systemically unwell 10/25; culture from last week grew Pseudomonas and MRSA. My experience with this MRSA is usually a true infection and Pseudomonas a colonizer I am going to put her on doxycycline today we put gentamicin on the wound surface. Continuing with silver alginate 11/1; she has tolerated her antibiotics. I gave her doxycycline. Was still using gentamicin. The original wound looks about the same thick rolled edges. The cast injury just above it is superficial 11/8; even after an aggressive debridement last week the wound on her medial Charcot foot on the right looks about the same. The surface looks satisfactory however rolled edges of callus thick skin and subcutaneous tissue. She did not do well on 2 separate occasions in a  total contact cast. The cast abrasion she has on the medial foot is just above closed. We have been using polymen Ag. She is attempting to offload this is much as she can with her crutches 11/15; the wound on the Charcot right foot about the same in terms of surface area.. The area above this on the ankle is healed. 11/29; Charcot right foot wound. This is medially. Wound may be slightly smaller however still raised edges of callus and skin around the wound circumference. We  have been using polymen 12/6; Charcot right foot wound. This is medially on the plantar aspect. I did not debride this today wound looks about the same in a week she is reappeared with raised edges of callus and thick skin around the wound. We have been using polymen no real improvement 12/13; again an extensive debridement today. Thick callus skin and subcutaneous tissue around the wound margin with undermining. We have been using silver collagen since last week 12/20; in spite of the debridement last week the wound really looks no difference. Circumferential callus shallow wound granulation does not look too bad. We have been using silver collagen really no improvement 1/10 patient comes in with a wound looking about the same. Circumferential callus and subcutaneous tissue. I did not debride this today we have been using Sorbact and wrapping her leg. Even putting her in a total contact cast did not help here. She has an appointment with [name] on 2/9. Question of whether a surgical procedure to correct the deformity underneath this area might help. 02/05/2020 upon evaluation today patient actually appears to be stalling to some degree here. Fortunately there is no signs of active infection at this time. She does have a significant callus buildup and therefore before rewrapping the staff wanted me to see if I could see her and remove some of the callus. I was definitely happy to do that today. 1/24; the patient arrives today with a cleaner looking her wound circumference but with the same rolled edges. Change to silver alginate last time because of drainage. Wound surface looks somewhat better although it precariously close to the underlying bone here. 1/31; she arrives with a continued improvement in the wound circumference less callus less thick skin. I changed her back to silver collagen today as the surface appears to be improved and the granulation looks stable. She sees [name] on  2/9 2/7; patient sees [name] on 2/9. Arrives with a considerable amount of thick callus around this wound the area is essentially unchanged she has been using silver collagen 2/14; [name] is ordered a CT scan of her foot on 2/22; we are using silver collagen. She had an MRI last year of this foot that did not show osteomyelitis I think that is what he is looking to determine however. Apparently a plain x-ray that was done that was negative 2/28; CT scan of the foot ordered by [name] showed advanced neuropathic change about the midfoot. Subluxation of the talus and navicular but no acute bony abnormality. He has been using silver collagen 3/14; patient follows up with [name] on Thursday. I am wondering whether there is any foot conserving surgical options to help get closure here. I have not been able to get this wound to close. Every time she comes in there is thick callus and subcutaneous tissue around the wound margins. I am assuming that this is because of inadequate pressure relief despite the patient's best effort with her crutches. T  be fair we have had 2 rounds of a total contact cast here o which have not worked either 3/28; patient saw [name] but we do not have a note. He is apparently planning for surgery in a month although I am not completely sure what procedure he has planned. I have not been able to get this wound to close even with pressure relief of a total contact cast on 2 occasions. The area that had underlying osteomyelitis on the left foot did closed with a total contact cast after treatment for the underlying infection. 4/11; [name] will take her to surgery on 4/28. The wound is unchanged in fact nothing I have done to this is really changed anything at all. This is in the medial part of her foot in the setting of a Charcot deformity. 2 prolonged courses of a total contact cast with multiple debridements of this area have not really resulted in any  healing. In the meantime the area on the left foot that had underlying osteomyelitis has a deep closed. We are using silver collagen Electronic Signature(s) Signed: 04/27/2020 4:45:43 PM By: Linton Ham MD Entered By: Linton Ham on 04/27/2020 14:37:25 -------------------------------------------------------------------------------- Physical Exam Details Patient Name: Date of Service: ENO [place], Madlynn J. 04/27/2020 1:45 PM Medical Record Number: [id number] Patient Account Number: [id number] Date of Birth/Sex: Treating RN: [birth year] (65 y.o. [name] Primary Care Provider: MA SO UDI, [place] MA LSA DA T Other Clinician: Referring Provider: Treating Provider/Extender: Linton Ham MA SO UDI, ELHA MA LSA DA T Weeks in Treatment: 50 Constitutional Sitting or standing Blood Pressure is within target range for patient.. Pulse regular and within target range for patient.Marland Kitchen Respirations regular, non-labored and within target range.. Temperature is normal and within the target range for the patient.Marland Kitchen Appears in no distress. Cardiovascular Pedal pulses are palpable. Notes Wound exam; as usual thick callus and subcutaneous tissue around the wound margins. I did not debride this having done this many times I have knocked the surrounding tissue down to the level of the wound but the next week it certainly does not make any difference that eventually returns to the predebridement form. There is no evidence of infection here. Electronic Signature(s) Signed: 04/27/2020 4:45:43 PM By: Linton Ham MD Entered By: Linton Ham on 04/27/2020 14:38:18 -------------------------------------------------------------------------------- Physician Orders Details Patient Name: Date of Service: [facility], [place]. 04/27/2020 1:45 PM Medical Record Number: [id number] Patient Account Number: [id number] Date of Birth/Sex: Treating RN: 1955-01-21 (65 y.o. [name] Primary Care Provider:  MA SO UDI, [place] MA LSA DA T Other Clinician: Referring Provider: Treating Provider/Extender: Linton Ham MA SO UDI, ELHA MA LSA DA T Weeks in Treatment: 37 Verbal / Phone Orders: No Diagnosis Coding ICD-10 Coding Code Description E11.621 Type 2 diabetes mellitus with foot ulcer L97.511 Non-pressure chronic ulcer of other part of right foot limited to breakdown of skin E11.42 Type 2 diabetes mellitus with diabetic polyneuropathy M14.671 Charcot's joint, right ankle and foot Follow-up Appointments Other: - Call to schedule appt if needed after surgery on 4/28 Bathing/ Shower/ Hygiene May shower with protection but do not get wound dressing(s) wet. Edema Control - Lymphedema / SCD / Other Elevate legs to the level of the heart or above for 30 minutes daily and/or when sitting, a frequency of: - throughout the day Avoid standing for long periods of time. Patient to wear own compression stockings every day. - left leg daily Moisturize legs daily. - left leg daily Off-Loading Open toe surgical  shoe to: - Right Foot Wound Treatment Wound #9 - Foot Wound Laterality: Right, Medial Cleanser: Byram Ancillary Kit - 15 Day Supply (DME) (Generic) Every Other Day/15 Days Discharge Instructions: Use supplies as instructed; Kit contains: (15) Saline Bullets; (15) 3x3 Gauze; 15 pr Gloves Cleanser: Soap and Water Every Other Day/15 Days Discharge Instructions: May shower and wash wound with dial antibacterial soap and water prior to dressing change. Peri-Wound Care: Sween Lotion (Moisturizing lotion) Every Other Day/15 Days Discharge Instructions: Apply moisturizing lotion as directed Prim Dressing: Promogran Prisma Matrix, 4.34 (sq in) (silver collagen) (DME) (Generic) Every Other Day/15 Days ary Discharge Instructions: Moisten collagen with saline or hydrogel Secondary Dressing: Woven Gauze Sponge, Non-Sterile 4x4 in Every Other Day/15 Days Discharge Instructions: Apply over primary dressing  as directed. Secondary Dressing: ABD Pad, 5x9 (DME) Every Other Day/15 Days Discharge Instructions: Apply over primary dressing as directed. Secondary Dressing: Felt 2.5 yds x 5.5 in Every Other Day/15 Days Discharge Instructions: Apply over primary dressing cut to form callous pad around wound Secured With: Kerlix Roll Sterile, 4.5x3.1 (in/yd) (DME) (Generic) Every Other Day/15 Days Discharge Instructions: Secure with Kerlix as directed. Secured With: Paper Tape, 2x10 (in/yd) (DME) (Generic) Every Other Day/15 Days Discharge Instructions: Secure dressing with tape as directed. Electronic Signature(s) Signed: 04/27/2020 4:45:43 PM By: Linton Ham MD Signed: 04/27/2020 5:30:45 PM By: Levan Hurst RN, BSN Entered By: Levan Hurst on 04/27/2020 14:36:45 -------------------------------------------------------------------------------- Problem List Details Patient Name: Date of Service: [facility], Melodye J. 04/27/2020 1:45 PM Medical Record Number: [id number] Patient Account Number: [id number] Date of Birth/Sex: Treating RN: 1955/12/23 (65 y.o. [name] Primary Care Provider: MA SO UDI, Willette Pa MA LSA DA T Other Clinician: Referring Provider: Treating Provider/Extender: Marlynn Perking, ELHA MA LSA DA T Weeks in Treatment: 63 Active Problems ICD-10 Encounter Code Description Active Date MDM Diagnosis E11.621 Type 2 diabetes mellitus with foot ulcer 01/28/2019 No Yes L97.511 Non-pressure chronic ulcer of other part of right foot limited to breakdown of 01/28/2019 No Yes skin E11.42 Type 2 diabetes mellitus with diabetic polyneuropathy 01/28/2019 No Yes M14.671 Charcot's joint, right ankle and foot 01/28/2019 No Yes Inactive Problems ICD-10 Code Description Active Date Inactive Date L97.524 Non-pressure chronic ulcer of other part of left foot with necrosis of bone 01/28/2019 01/28/2019 L97.514 Non-pressure chronic ulcer of other part of right foot with necrosis of bone  01/28/2019 01/28/2019 Z00.923 Charcot's joint, left ankle and foot 01/28/2019 01/28/2019 R00.762 Other chronic osteomyelitis, left ankle and foot 04/08/2019 04/08/2019 Resolved Problems Electronic Signature(s) Signed: 04/27/2020 4:45:43 PM By: Linton Ham MD Entered By: Linton Ham on 04/27/2020 14:35:44 -------------------------------------------------------------------------------- Progress Note Details Patient Name: Date of Service: [name], Lisha J. 04/27/2020 1:45 PM Medical Record Number: [id number] Patient Account Number: [id number] Date of Birth/Sex: Treating RN: 1955/10/26 (65 y.o. [name] Primary Care Provider: MA SO UDI, Clinton MA LSA DA T Other Clinician: Referring Provider: Treating Provider/Extender: Linton Ham MA SO UDI, ELHA MA LSA DA T Weeks in Treatment: 17 Subjective History of Present Illness (HPI) 65 yrs old bf here for follow up recurrent left charcot foot ulcer. She has DM. She is not a smoker. She states a blister developed 3 months ago at site of previous breakdown from 1 year ago. It was debrided at the podiatrist's office. She went to the ER and then sent here. She denies pain. Xray was negative for osteo. Culture from this clinic negative. 09/08/14 Referred [address] [name] to [name] for Charcot foot. Seen  by him and MRI scheduled 09/19/14. Prior silver collagen but this was not ordered last visit, patient has been rinsing with saline alone. Re institute silver collagen every other day. F/u 2 weeks with Dr. Dellia Nims as Labor Day holiday. Supplies ordered. 09/25/14; this is a patient with a Charcot foot on the left. she has a wound over the plantar aspect of her left calcaneus. She has been seen by [name] of orthopedic surgery and has had an MRI. She is apparently going within the next week or 2 for corrective surgery which will involve an external fixator. I don't have any of the details of this. 10/06/14; The patient is a 65 yrs old bf with a  left Charcot foot and ulcer on the plantar. 12/01/14 Returns post surgery with [name]. Has follow up visit later this week with him, currently in facility and [facility] over this extremity. States no drainage from foot and no current wound care. On exam callus and scab in place, suspect healed. Has external fixator in place. Will defer to [name] for management, ok to place moisturizing cream over scabs and callus and she will call if she requires follow up here. READMISSION 01/28/2019 Patient was in the clinic in 2016 for a wound on the plantar aspect of her left calcaneus. Predominantly cared for by Dr. [name] she was referred to [name] and had corrective surgery for the position of her left ankle I believe. She had previously been here in 2015 and then prior to that in 2011 2012 that I do not have these records. More recently she has developed fairly extensive wounds on the right medial foot, left lateral foot and a small area on the right medial great toe. Right medial foot wound has been there for 6 to 7 months, left foot wound for 2 to 3 months and the right great toe only over the last few weeks. She has been followed by [name] at Dr. [name] office it sounds as though she has been undergoing callus paring and silver nitrate. The patient has been washing these off with soap and water and plan applying dry dressing Past medical history, type 2 diabetes well controlled with a recent hemoglobin A1c of 7 on 11/16, type 2 diabetes with peripheral neuropathy, previous left Charcot joint surgery bilateral Charcot joints currently, stage III chronic renal failure, hypertension, obstructive sleep apnea, history of MRSA and gastroesophageal reflux. 1/18; this is a patient with bilateral Charcot feet. Plain x-rays that I did of both of these wounds that are either at bone or precariously close to bone were done. On the right foot this showed possible lytic destruction involving the  anterior aspect of the talus which may represent a wound osteomyelitis. An MRI was recommended. On the left there was no definite lytic destruction although the wound is deep undermining's and I think probably requires an MRI on this basis in any case 1/25; patient was supposed to have her MRI last Friday of her bilateral feet however they would not do it because there was silver alginate in her dressings, will replace with calcium alginate today and will see if we can get her done today 2/1; patient did not get her MRIs done last week because of issues with the MRI department receiving orders to do bilateral feet. She has 2 deep wounds in the setting of Charcot feet deformities bilaterally. Both of them at one point had exposed bone although I had trouble demonstrating that today. I am not sure  that we can offload these very aggressively as I watched her leave the clinic last week her gait is very narrow and unsteady. 2/15; the MRI of her right foot did not show osteomyelitis potentially osteonecrosis of the talus. 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 The wounds are a large wound on the right medial foot and on the left a substantial wound on the left lateral plantar calcaneus. We have been using silver alginate. Ultimately I think we need to consider a total contact cast on the right while we work through the possibility of osteomyelitis in the left. I watched her walk out with her crutches I have some trepidation about the ability to walk with the cast on her right foot. Nevertheless with her Charcot deformities I do not think there is a way to heal these short of offloading them aggressively. 2/22; the culture of the bone in the left foot that I did last week showed a few Citrobacter Koseri as well as some streptococcal species. In my attempted bone for pathology did not actually yield a lot of bone the pathologist did not identify any  osteomyelitis. 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 Is promised today and aggressive debridement of the right foot and a total contact cast which surprisingly she seemed to tolerate quite well 03/13/2019 upon evaluation today patient appears to be doing well with her total contact cast she is here for the first cast change. She had no areas of rubbing or any other complications at this point. Overall things are doing quite well. 2/26; the patient was kindly seen by Dr. Megan Salon of infectious disease on 2/25. She is now on IV vancomycin PICC line placement in 2 days and oral Flagyl. This was in response to her MRI showing osteomyelitis of the left calcaneus concerning for osteomyelitis. We have been putting her right leg in a total contact cast. Our nurses report drainage. She is tolerating the total contact cast well. 3/4; the patient is started her IV antibiotics and is taking IV vancomycin and oral Flagyl. She appears to be tolerating these both well. This is for osteomyelitis involving the left calcaneus. The area on the right in the setting of bilateral Charcot deformities did not have any bone infection on MRI. We put a total contact cast on her with silver alginate as the primary dressing and she returns today in follow-up. Per our intake nurse there was too much drainage to leave this on all week therefore we will bring her back for an early change 3/8; follow-up total contact cast she is still having a lot of drainage probably too much drainage from the right foot wound will week with the same cast. She will be back on Thursday. The area on the left looks somewhat better 3/11; patient here for a total contact cast change. 3/15; patient came in for wound care evaluation. She is still on IV vancomycin and oral Flagyl for osteomyelitis in the  left foot. The area on the right foot we are putting in a total contact cast. 3/18 for total contact cast change on the right foot 3/22; the patient was here for wound review today. The area on the right foot is slightly smaller in terms of surface area. However the surface of the wound is very gritty and hyper  granulated. More problematically the area on the plantar left foot has increased depth indeed in the center of this it probes to bone. We have been using Hydrofera Blue in both areas. She is on antibiotics as prescribed by infectious disease IV vancomycin and oral Flagyl 3/25; the patient is in for total contact cast change. Our intake nurse was concerned about the amount of drainage which soaked right to the multiple layers we are putting on this. Wondered about options. The wound bed actually looks as healthy as this is looked although there may not be a lot of change in dimensions things are looking a lot better. If we are going to heal this woman's wound which is in the middle of her right Charcot foot this is going to need to be offloaded. There are not many alternatives 3/29; still not a lot of improvement although the surface of the right wound looks better. We have been using Hydrofera Blue 04/17/2019 upon evaluation today patient appears to be doing well with a total contact cast. With actually having to change this twice a week. She saw Dr. Dellia Nims for wound care earlier in the week this is for the cast change today. That is due to the fact that she has significant drainage. Overall the wound is been looking excellent however. 4/5; we continue to put a total contact cast on this patient with Hydrofera Blue. Still a lot of drainage which precludes a simple weekly change or changing this twice a week. 4/8; total contact cast changed today. Apparently the wound looks better per our intake nurse 4/12; patient here for wound care evaluation. Wounds on the right foot have not changed in  dimension none about a month left foot looks similar. Apparently her antibiotics that we are giving her for osteomyelitis in the left foot complete on Wednesday. We have been using Hydrofera Blue on the right foot under a total contact cast. Nurses report greenish drainage although I think this may be discoloration from the [facility] 4/15; back for cast change wounds look quite good although still moderate amount of drainage. T contact cast reapplied otal 4/19;. Wound evaluation. Wound on the right foot is smaller surface looks healthy. There is still the divot in the middle part of this wound but I could not feel any bone. Area on the left foot also looks better She has completed her antibiotics but still has the PICC line in. 4/23; patient comes back for a total contact cast change this was done in the standard fashion no issues 4/26; wound measures slightly larger on the right foot which is disappointing. She sees Dr. Megan Salon with regards to her antibiotics on Wednesday. I believe she is on vancomycin IV and oral Flagyl. 4/29; in for her routine total contact cast change. She saw Dr. Megan Salon this morning. He is asking about antibiotic continuation I will be in touch with him. I did not look at her wound today 5/3; patient saw Dr. Megan Salon on 4/28. I did send him a note asking about the continuation of perhaps an oral regimen. I have not heard back. She still has an elevated sedimentation rate at 81. Her wounds are smaller. 5/6; in for her total contact cast change. We did not look at the wound today. She has seen Dr. Megan Salon and is now on oral Keflex and Flagyl. She seems to be tolerating these well 5/10; she had her PICC line removed. The wound on the right foot after some initial improvement has not made  any improvement even with the total contact cast. Still having a lot of drainage. Likewise the area on the left foot really is not any better either 5/14; in for a cast change today.  We will look at the right foot on Monday. If this is not making any progress I am going to try to cast the left foot. 5/17; the wound on the right foot is absolutely no different. I am going to put ointment on this area and change the cast of the left foot. We have been using silver alginate there 5/21; apparently the area on the right foot was better per our intake nurse although I did not see it. We did a standard total contact cast change on the left foot we have been applying silver alginate to the wound here. 06/10/19-Area on the right foot per dimensions slightly better, the left foot has still some depth applying silver alginate to the wound with TCC 06/13/19-Patient here for Casting to Left foot 6/1; patient had the total contact cast were placed on the left foot. Noted some swelling in her right leg although the dimensions of the wound on the right foot are better. We have been using PolyMem on the right and silver alginate on the left under the cast 6/7; we continue to make nice progress in both her plantar feet wound. We are putting the total contact cast on the left. Surprisingly in spite of this the area on the right medial foot in the middle of her Charcot deformities actually is making progress as well 07/01/2019 upon evaluation today patient actually appears to be doing quite well with regard to her wounds on the feet compared to when I saw her last that has been quite some time. With that being said I do believe the total contact cast has been beneficial in this left foot and she has a lot of signs of improvement she does have some callus overhanging which is caused a little bit of a ledge and an issue here. I do believe that we may be able to do something to help in that regard. Fortunately otherwise the wound seems to be progressing quite nicely. 6/21; the patient's area on the left foot that we are currently casting is just about closed. The area on the right medial foot has a healthy  looking surface but may be not changed as much from last week in terms of surface area. We are using silver alginate on the right and polymen Ag under the cast 6/28; only a small open area on the left foot remains. Using silver alginate under a total contact cast. She has a diabetic shoe I have asked her to bring that for next week where this may be closed. The area on the right foot with a Charcot deformity is also measuring slightly smaller. We are using PolyMem Ag here 7/6; left foot is totally closed. She does not require a total contact cast. She will graduate to her own diabetic shoe. ooThe area on the right foot with a Charcot deformity measuring about the same. We have been using polymen under compression wraps. She did not do well in this area with a total contact cast 7/13; left foot remains intact. She is skillfully covering this area with foam and using her diabetic shoe. Slight area improvement of the right foot wound. Still rolled senescent looking edges. We have been using polymen 7/19; left foot remains intact. Wound measuring slightly smaller on the right foot in the middle of  the Charcot deformity. We have been using PolyMem Ag 7/26; per the patient left foot remains intact. The wound on the right foot looks slightly smaller but dimensions are measuring the same. We have been using polymen Ag 8/9-Patient back after 2 weeks, wound of the right foot again looking slightly smaller, continuing to use PolyMem silver 8/16; patient has made some minor improvements in wound area on the right foot. Using polymen Ag. The left foot remains healed 8/23; continued slight improvement in dimensions. Rolled edges around the wound edge noted. We have been using polymen Ag. Patient offloading with a surgical shoe and crutches 8/30; no change in surface area. Thick rolled skin and subcutaneous tissue around the edges debris on the wound surface. We have been using polymen Ag initially with some  improvement however stalled on the last 2 weeks in terms of surface area 9/13; surface area is smaller still using polymen Ag. Thick rolled skin and subcutaneous tissue around the edges requiring debridement. 9/20; again surface area is improved using polymen Ag however each week she develops thick rolled skin and subcutaneous tissue around the wound edges requiring debridement. She is offloading this using crutches 9/27; we've been using PolyMem AG. Wound does not look any different from last week. Raised edges of callus skin and subcutaneous tissue around the wound bed. 10/5; we been using polymen Ag. No ability to put a total contact cast on today in the clinic. Wound is about the same still rolled edges of thick callus around the margins 10/18; we put her in a total contact cast last week things did not go well. Not only does the wound looked worse macerated angry with thick callus and some warmth around it. She had another wound above this and one on the anterior right leg. I am not totally certain why this happened. She has not been systemically unwell 10/25; culture from last week grew Pseudomonas and MRSA. My experience with this MRSA is usually a true infection and Pseudomonas a colonizer I am going to put her on doxycycline today we put gentamicin on the wound surface. Continuing with silver alginate 11/1; she has tolerated her antibiotics. I gave her doxycycline. Was still using gentamicin. The original wound looks about the same thick rolled edges. The cast injury just above it is superficial 11/8; even after an aggressive debridement last week the wound on her medial Charcot foot on the right looks about the same. The surface looks satisfactory however rolled edges of callus thick skin and subcutaneous tissue. She did not do well on 2 separate occasions in a total contact cast. The cast abrasion she has on the medial foot is just above closed. We have been using polymen Ag. She is  attempting to offload this is much as she can with her crutches 11/15; the wound on the Charcot right foot about the same in terms of surface area.. The area above this on the ankle is healed. 11/29; Charcot right foot wound. This is medially. Wound may be slightly smaller however still raised edges of callus and skin around the wound circumference. We have been using polymen 12/6; Charcot right foot wound. This is medially on the plantar aspect. I did not debride this today wound looks about the same in a week she is reappeared with raised edges of callus and thick skin around the wound. We have been using polymen no real improvement 12/13; again an extensive debridement today. Thick callus skin and subcutaneous tissue around the wound margin with  undermining. We have been using silver collagen since last week 12/20; in spite of the debridement last week the wound really looks no difference. Circumferential callus shallow wound granulation does not look too bad. We have been using silver collagen really no improvement 1/10 patient comes in with a wound looking about the same. Circumferential callus and subcutaneous tissue. I did not debride this today we have been using Sorbact and wrapping her leg. Even putting her in a total contact cast did not help here. She has an appointment with [name] on 2/9. Question of whether a surgical procedure to correct the deformity underneath this area might help. 02/05/2020 upon evaluation today patient actually appears to be stalling to some degree here. Fortunately there is no signs of active infection at this time. She does have a significant callus buildup and therefore before rewrapping the staff wanted me to see if I could see her and remove some of the callus. I was definitely happy to do that today. 1/24; the patient arrives today with a cleaner looking her wound circumference but with the same rolled edges. Change to silver alginate last time because  of drainage. Wound surface looks somewhat better although it precariously close to the underlying bone here. 1/31; she arrives with a continued improvement in the wound circumference less callus less thick skin. I changed her back to silver collagen today as the surface appears to be improved and the granulation looks stable. She sees [name] on 2/9 2/7; patient sees [name] on 2/9. Arrives with a considerable amount of thick callus around this wound the area is essentially unchanged she has been using silver collagen 2/14; [name] is ordered a CT scan of her foot on 2/22; we are using silver collagen. She had an MRI last year of this foot that did not show osteomyelitis I think that is what he is looking to determine however. Apparently a plain x-ray that was done that was negative 2/28; CT scan of the foot ordered by [name] showed advanced neuropathic change about the midfoot. Subluxation of the talus and navicular but no acute bony abnormality. He has been using silver collagen 3/14; patient follows up with [name] on Thursday. I am wondering whether there is any foot conserving surgical options to help get closure here. I have not been able to get this wound to close. Every time she comes in there is thick callus and subcutaneous tissue around the wound margins. I am assuming that this is because of inadequate pressure relief despite the patient's best effort with her crutches. T be fair we have had 2 rounds of a total contact cast here o which have not worked either 3/28; patient saw [name] but we do not have a note. He is apparently planning for surgery in a month although I am not completely sure what procedure he has planned. I have not been able to get this wound to close even with pressure relief of a total contact cast on 2 occasions. The area that had underlying osteomyelitis on the left foot did closed with a total contact cast after treatment for the underlying  infection. 4/11; [name] will take her to surgery on 4/28. The wound is unchanged in fact nothing I have done to this is really changed anything at all. This is in the medial part of her foot in the setting of a Charcot deformity. 2 prolonged courses of a total contact cast with multiple debridements of this area have  not really resulted in any healing. In the meantime the area on the left foot that had underlying osteomyelitis has a deep closed. We are using silver collagen Objective Constitutional Sitting or standing Blood Pressure is within target range for patient.. Pulse regular and within target range for patient.Marland Kitchen Respirations regular, non-labored and within target range.. Temperature is normal and within the target range for the patient.Marland Kitchen Appears in no distress. Vitals Time Taken: 1:54 PM, Height: 68 in, Weight: 265 lbs, BMI: 40.3, Temperature: 98 F, Pulse: 70 bpm, Respiratory Rate: 17 breaths/min, Blood Pressure: 118/72 mmHg, Capillary Blood Glucose: 125 mg/dl. Cardiovascular Pedal pulses are palpable. General Notes: Wound exam; as usual thick callus and subcutaneous tissue around the wound margins. I did not debride this having done this many times I have knocked the surrounding tissue down to the level of the wound but the next week it certainly does not make any difference that eventually returns to the predebridement form. There is no evidence of infection here. Integumentary (Hair, Skin) Wound #9 status is Open. Original cause of wound was Blister. The date acquired was: 06/18/2018. The wound has been in treatment 65 weeks. The wound is located on the Right,Medial Foot. The wound measures 2cm length x 1.7cm width x 0.2cm depth; 2.67cm^2 area and 0.534cm^3 volume. There is Fat Layer (Subcutaneous Tissue) exposed. There is no tunneling noted, however, there is undermining starting at 12:00 and ending at 12:00 with a maximum distance of 0.5cm. There is a medium amount of  serosanguineous drainage noted. The wound margin is thickened. There is large (67-100%) pink, pale granulation within the wound bed. There is no necrotic tissue within the wound bed. 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 Edema Control - Lymphedema / SCD / Other: Elevate legs to the level of the heart or above for 30 minutes daily and/or when sitting, a frequency of: - throughout the day Avoid standing for long periods of time. Patient to wear own compression stockings every day. - left leg daily Moisturize legs daily. - left leg daily Off-Loading: Open toe surgical shoe to: - Right Foot WOUND #9: - Foot Wound Laterality: Right, Medial Cleanser: Byram Ancillary Kit - 15 Day Supply (DME) (Generic) Every Other Day/15 Days Discharge Instructions: Use supplies as instructed; Kit contains: (15) Saline Bullets; (15) 3x3 Gauze; 15 pr Gloves Cleanser: Soap and Water Every Other Day/15 Days Discharge Instructions: May shower and wash wound with dial antibacterial soap and water prior to dressing change. Peri-Wound Care: Sween Lotion (Moisturizing lotion) Every Other Day/15 Days Discharge Instructions: Apply moisturizing lotion as directed Prim Dressing: Promogran Prisma Matrix, 4.34 (sq in) (silver collagen) (DME) (Generic) Every Other Day/15 Days ary Discharge Instructions: Moisten collagen with saline or hydrogel Secondary Dressing: Woven Gauze Sponge, Non-Sterile 4x4 in Every Other Day/15 Days Discharge Instructions: Apply over primary dressing as directed. Secondary Dressing: ABD Pad, 5x9 (DME) Every  Other Day/15 Days Discharge Instructions: Apply over primary dressing as directed. Secondary Dressing: Felt 2.5 yds x 5.5 in Every Other Day/15 Days Discharge Instructions: Apply over primary dressing cut to form callous pad around wound Secured With: Kerlix Roll Sterile, 4.5x3.1 (in/yd) (DME) (Generic) Every  Other Day/15 Days Discharge Instructions: Secure with Kerlix as directed. Secured With: Paper T ape, 2x10 (in/yd) (DME) (Generic) Every Other Day/15 Days Discharge Instructions: Secure dressing with tape as directed. 1. I have continued with silver collagen that the patient is changing herself. 2. I do not see the point in bringing her back here before the surgery on April 28. 3. We will leave her to [name] to call us postoperatively if there is an issue that we will better deal with 4. I have asked the patient to call us if there is an issue before the surgery or there is a problem she needs [place] to look at Electronic Signature(s) Signed: 04/27/2020 4:45:43 PM By: Linton Ham MD Entered By: Linton Ham on 04/27/2020 14:39:23 -------------------------------------------------------------------------------- SuperBill Details Patient Name: Date of Service: [facility], Jasmina J. 04/27/2020 Medical Record Number: [id number] Patient Account Number: [id number] Date of Birth/Sex: Treating RN: Jun 12, 1955 (65 y.o. [name] Primary Care Provider: MA SO UDI, [place] MA LSA DA T Other Clinician: Referring Provider: Treating Provider/Extender: Marlynn Perking, ELHA MA LSA DA T Weeks in Treatment: 95 Diagnosis Coding ICD-10 Codes Code Description E11.621 Type 2 diabetes mellitus with foot ulcer L97.511 Non-pressure chronic ulcer of other part of right foot limited to breakdown of skin E11.42 Type 2 diabetes mellitus with diabetic polyneuropathy M14.671 Charcot's joint, right ankle and foot Facility Procedures CPT4 Code: [id number] Description: 99213 - WOUND CARE VISIT-LEV  3 EST PT Modifier: Quantity: 1 Physician Procedures : CPT4 Code Description Modifier [id number] 99213 - WC PHYS LEVEL 3 - EST PT ICD-10 Diagnosis Description L97.511 Non-pressure chronic ulcer of other part of right foot limited to breakdown of skin E11.621 Type 2 diabetes mellitus with foot ulcer Quantity: 1 Electronic Signature(s) Signed: 04/27/2020 5:30:45 PM By: Levan Hurst RN, BSN Signed: 04/28/2020 4:33:39 PM By: Linton Ham MD Previous Signature: 04/27/2020 4:45:43 PM Version By: Linton Ham MD Entered By: Levan Hurst on 04/27/2020 17:17:03

## 2020-04-29 NOTE — Progress Notes (Signed)
KINBERLY, PERRIS ([id number]) Visit Report for 04/27/2020 Arrival Information Details Patient Name: Date of Service: MAHI, ZABRISKIE 04/27/2020 1:45 PM Medical Record Number: [id number] Patient Account Number: [id number] Date of Birth/Sex: Treating RN: [birth year] (65 y.o. Tonita Phoenix, Lauren Primary Care Jakyrie Totherow: MA SO UDI, [place] MA LSA DA T Other Clinician: Referring Adonte Vanriper: Treating Deisha Stull/Extender: Linton Ham MA SO UDI, ELHA MA LSA DA T Weeks in Treatment: 24 Visit Information History Since Last Visit Added or deleted any medications: No Patient Arrived: Ambulatory Any new allergies or adverse reactions: No Arrival Time: 13:50 Had a fall or experienced change in No Accompanied By: slef activities of daily living that may affect Transfer Assistance: None risk of falls: Patient Identification Verified: Yes Signs or symptoms of abuse/neglect since last visito No Secondary Verification Process Completed: Yes Hospitalized since last visit: No Patient Requires Transmission-Based Precautions: No Implantable device outside of the clinic excluding No Patient Has Alerts: No cellular tissue based products placed in the center since last visit: Has Dressing in Place as Prescribed: Yes Pain Present Now: No Electronic Signature(s) Signed: 04/27/2020 5:44:40 PM By: Rhae Hammock RN Entered By: Rhae Hammock on 04/27/2020 13:51:18 -------------------------------------------------------------------------------- Clinic Level of Care Assessment Details Patient Name: Date of Service: ENO [facility]. 04/27/2020 1:45 PM Medical Record Number: [id number] Patient Account Number: [id number] Date of Birth/Sex: Treating RN: 10-10-1955 (65 y.o. [name] Primary Care Laurabeth Yip: MA SO UDI, ELHA MA LSA DA T Other Clinician: Referring Anaalicia Reimann: Treating Riel Hirschman/Extender: Linton Ham MA SO UDI, ELHA MA LSA DA T Weeks in Treatment: 42 Clinic Level of Care  Assessment Items TOOL 4 Quantity Score X- 1 0 Use when only an EandM is performed on FOLLOW-UP visit ASSESSMENTS - Nursing Assessment / Reassessment X- 1 10 Reassessment of Co-morbidities (includes updates in patient status) X- 1 5 Reassessment of Adherence to Treatment Plan ASSESSMENTS - Wound and Skin A ssessment / Reassessment X - Simple Wound Assessment / Reassessment - one wound 1 5 []  - 0 Complex Wound Assessment / Reassessment - multiple wounds []  - 0 Dermatologic / Skin Assessment (not related to wound area) ASSESSMENTS - Focused Assessment []  - 0 Circumferential Edema Measurements - multi extremities []  - 0 Nutritional Assessment / Counseling / Intervention X- 1 5 Lower Extremity Assessment (monofilament, tuning fork, pulses) []  - 0 Peripheral Arterial Disease Assessment (using hand held doppler) ASSESSMENTS - Ostomy and/or Continence Assessment and Care []  - 0 Incontinence Assessment and Management []  - 0 Ostomy Care Assessment and Management (repouching, etc.) PROCESS - Coordination of Care X - Simple Patient / Family Education for ongoing care 1 15 []  - 0 Complex (extensive) Patient / Family Education for ongoing care X- 1 10 Staff obtains Programmer, systems, Records, T Results / Process Orders est []  - 0 Staff telephones HHA, Nursing Homes / Clarify orders / etc []  - 0 Routine Transfer to another Facility (non-emergent condition) []  - 0 Routine Hospital Admission (non-emergent condition) []  - 0 New Admissions / Biomedical engineer / Ordering NPWT Apligraf, etc. , []  - 0 Emergency Hospital Admission (emergent condition) X- 1 10 Simple Discharge Coordination []  - 0 Complex (extensive) Discharge Coordination PROCESS - Special Needs []  - 0 Pediatric / Minor Patient Management []  - 0 Isolation Patient Management []  - 0 Hearing / Language / Visual special needs []  - 0 Assessment of Community assistance (transportation, D/C planning, etc.) []  -  0 Additional assistance / Altered mentation []  - 0 Support Surface(s) Assessment (bed, cushion, seat, etc.) INTERVENTIONS -  Wound Cleansing / Measurement X - Simple Wound Cleansing - one wound 1 5 []  - 0 Complex Wound Cleansing - multiple wounds X- 1 5 Wound Imaging (photographs - any number of wounds) []  - 0 Wound Tracing (instead of photographs) X- 1 5 Simple Wound Measurement - one wound []  - 0 Complex Wound Measurement - multiple wounds INTERVENTIONS - Wound Dressings X - Small Wound Dressing one or multiple wounds 1 10 []  - 0 Medium Wound Dressing one or multiple wounds []  - 0 Large Wound Dressing one or multiple wounds X- 1 5 Application of Medications - topical []  - 0 Application of Medications - injection INTERVENTIONS - Miscellaneous []  - 0 External ear exam []  - 0 Specimen Collection (cultures, biopsies, blood, body fluids, etc.) []  - 0 Specimen(s) / Culture(s) sent or taken to Lab for analysis []  - 0 Patient Transfer (multiple staff / Civil Service fast streamer / Similar devices) []  - 0 Simple Staple / Suture removal (25 or less) []  - 0 Complex Staple / Suture removal (26 or more) []  - 0 Hypo / Hyperglycemic Management (close monitor of Blood Glucose) []  - 0 Ankle / Brachial Index (ABI) - do not check if billed separately X- 1 5 Vital Signs Has the patient been seen at the hospital within the last three years: Yes Total Score: 95 Level Of Care: New/Established - Level 3 Electronic Signature(s) Signed: 04/27/2020 5:30:45 PM By: Levan Hurst RN, BSN Entered By: Levan Hurst on 04/27/2020 17:16:55 -------------------------------------------------------------------------------- Encounter Discharge Information Details Patient Name: Date of Service: ENO [place], Blair J. 04/27/2020 1:45 PM Medical Record Number: [id number] Patient Account Number: [id number] Date of Birth/Sex: Treating RN: 09-05-1955 (65 y.o. [name] Primary Care Javante Nilsson: MA SO UDI, Willette Pa MA LSA  DA T Other Clinician: Referring Quetzally Callas: Treating Ether Goebel/Extender: Linton Ham MA SO UDI, ELHA MA LSA DA T Weeks in Treatment: 61 Encounter Discharge Information Items Discharge Condition: Stable Ambulatory Status: Crutches Discharge Destination: Home Transportation: Private Auto Accompanied By: self Schedule Follow-up Appointment: Yes Clinical Summary of Care: Electronic Signature(s) Signed: 04/27/2020 5:44:53 PM By: Deon Pilling Entered By: Deon Pilling on 04/27/2020 15:01:35 -------------------------------------------------------------------------------- Lower Extremity Assessment Details Patient Name: Date of Service: TY, OSHIMA 04/27/2020 1:45 PM Medical Record Number: [id number] Patient Account Number: [id number] Date of Birth/Sex: Treating RN: 1955-04-21 (65 y.o. Tonita Phoenix, Lauren Primary Care Mariel Lukins: MA SO UDI, [place] MA LSA DA T Other Clinician: Referring Ariann Khaimov: Treating Nylan Nakatani/Extender: Linton Ham MA SO UDI, ELHA MA LSA DA T Weeks in Treatment: 65 Edema Assessment Assessed: [Left: No] [Right: No] Edema: [Left: N] [Right: o] Calf Left: Right: Point of Measurement: 43 cm From Medial Instep 36 cm Ankle Left: Right: Point of Measurement: 12 cm From Medial Instep 27 cm Vascular Assessment Pulses: Dorsalis Pedis Palpable: [Right:Yes] Posterior Tibial Palpable: [Right:Yes] Electronic Signature(s) Signed: 04/27/2020 5:44:40 PM By: Rhae Hammock RN Entered By: Rhae Hammock on 04/27/2020 14:01:03 -------------------------------------------------------------------------------- Multi Wound Chart Details Patient Name: Date of Service: [facility], Dylan J. 04/27/2020 1:45 PM Medical Record Number: [id number] Patient Account Number: [id number] Date of Birth/Sex: Treating RN: 06-02-1955 (65 y.o. [name] Primary Care Emmajo Bennette: MA SO UDI, [place] MA LSA DA T Other Clinician: Referring Nahla Lukin: Treating Elija Mccamish/Extender:  Linton Ham MA SO UDI, ELHA MA LSA DA T Weeks in Treatment: 27 Vital Signs Height(in): 68 Capillary Blood Glucose(mg/dl): 125 Weight(lbs): 265 Pulse(bpm): 70 Body Mass Index(BMI): 40 Blood Pressure(mmHg): 118/72 Temperature(F): 98 Respiratory Rate(breaths/min): 17 Photos: [9:No Photos Right, Medial Foot] [N/A:N/A N/A] Wound Location: [9:Blister] [  N/A:N/A] Wounding Event: [9:Diabetic Wound/Ulcer of the Lower] [N/A:N/A] Primary Etiology: [9:Extremity Glaucoma, Chronic sinus] [N/A:N/A] Comorbid History: [9:problems/congestion, Anemia, Sleep Apnea, Hypertension, Type II Diabetes, Rheumatoid Arthritis, Neuropathy 06/18/2018] [N/A:N/A] Date Acquired: [9:65] [N/A:N/A] Weeks of Treatment: [9:Open] [N/A:N/A] Wound Status: [9:2x1.7x0.2] [N/A:N/A] Measurements L x W x D (cm) [9:2.67] [N/A:N/A] A (cm) : rea [9:0.534] [N/A:N/A] Volume (cm) : [9:75.70%] [N/A:N/A] % Reduction in A rea: [9:94.60%] [N/A:N/A] % Reduction in Volume: [9:12] Starting Position 1 (o'clock): [9:12] Ending Position 1 (o'clock): [9:0.5] Maximum Distance 1 (cm): [9:Yes] [N/A:N/A] Undermining: [9:Grade 2] [N/A:N/A] Classification: [9:Medium] [N/A:N/A] Exudate A mount: [9:Serosanguineous] [N/A:N/A] Exudate Type: [9:red, brown] [N/A:N/A] Exudate Color: [9:Thickened] [N/A:N/A] Wound Margin: [9:Large (67-100%)] [N/A:N/A] Granulation A mount: [9:Pink, Pale] [N/A:N/A] Granulation Quality: [9:None Present (0%)] [N/A:N/A] Necrotic A mount: [9:Fat Layer (Subcutaneous Tissue): Yes N/A] Exposed Structures: [9:Fascia: No Tendon: No Muscle: No Joint: No Bone: No Small (1-33%)] [N/A:N/A] Treatment Notes Electronic Signature(s) Signed: 04/27/2020 4:45:43 PM By: Linton Ham MD Signed: 04/27/2020 5:30:45 PM By: Levan Hurst RN, BSN Entered By: Linton Ham on 04/27/2020 14:35:52 -------------------------------------------------------------------------------- Multi-Disciplinary Care Plan Details Patient Name: Date of  Service: [facility], Brendalyn J. 04/27/2020 1:45 PM Medical Record Number: [id number] Patient Account Number: [id number] Date of Birth/Sex: Treating RN: 1955-02-25 (65 y.o. [name] Primary Care Dalisha Shively: MA SO UDI, [place] MA LSA DA T Other Clinician: Referring Natiya Seelinger: Treating Verdun Rackley/Extender: Linton Ham MA SO UDI, ELHA MA LSA DA T Weeks in Treatment: 62 [place] reviewed with physician Active Inactive Wound/Skin Impairment Nursing Diagnoses: Impaired tissue integrity Knowledge deficit related to ulceration/compromised skin integrity Goals: Patient/caregiver will verbalize understanding of skin care regimen Date Initiated: 01/28/2019 Target Resolution Date: 05/29/2020 Goal Status: Active Interventions: Assess patient/caregiver ability to obtain necessary supplies Assess patient/caregiver ability to perform ulcer/skin care regimen upon admission and as needed Assess ulceration(s) every visit Provide education on ulcer and skin care Notes: Electronic Signature(s) Signed: 04/27/2020 5:30:45 PM By: Levan Hurst RN, BSN Entered By: Levan Hurst on 04/27/2020 17:14:04 -------------------------------------------------------------------------------- Pain Assessment Details Patient Name: Date of Service: Yolanda Manges, Shalona J. 04/27/2020 1:45 PM Medical Record Number: [id number] Patient Account Number: [id number] Date of Birth/Sex: Treating RN: November 19, 1955 (65 y.o. Tonita Phoenix, Lauren Primary Care Kayanna Mckillop: MA SO UDI, [place] MA LSA DA T Other Clinician: Referring Casandra Dallaire: Treating Cecilia Vancleve/Extender: Linton Ham MA SO UDI, ELHA MA LSA DA T Weeks in Treatment: 1 Active Problems Location of Pain Severity and Description of Pain Patient Has Paino No Site Locations Rate the pain. Rate the pain. Current Pain Level: 0 Pain Management and Medication Current Pain Management: Electronic Signature(s) Signed: 04/27/2020 5:44:40 PM By: Rhae Hammock  RN Entered By: Rhae Hammock on 04/27/2020 13:55:07 -------------------------------------------------------------------------------- Patient/Caregiver Education Details Patient Name: Date of Service: ENO [facility] [place] 4/11/2022andnbsp1:45 PM Medical Record Number: [id number] Patient Account Number: [id number] Date of Birth/Gender: Treating RN: 11-04-1955 (65 y.o. [name] Primary Care Physician: MA SO UDI, Willette Pa MA LSA DA T Other Clinician: Referring Physician: Treating Physician/Extender: Linton Ham MA SO UDI, ELHA MA LSA DA T Weeks in Treatment: 80 Education Assessment Education Provided To: Patient Education Topics Provided Wound/Skin Impairment: Methods: Explain/Verbal Responses: State content correctly [employer]) Signed: 04/27/2020 5:30:45 PM By: Levan Hurst RN, BSN Entered By: Levan Hurst on 04/27/2020 17:14:18 -------------------------------------------------------------------------------- Wound Assessment Details Patient Name: Date of Service: ENO [place], Emilea J. 04/27/2020 1:45 PM Medical Record Number: [id number] Patient Account Number: [id number] Date of Birth/Sex: Treating RN: [birth year] (64 y.o. Benjaman Lobe Primary Care Jayra Choyce: MA SO UDI,  ELHA MA LSA DA T Other Clinician: Referring Nasean Zapf: Treating Rithwik Schmieg/Extender: Linton Ham MA SO UDI, ELHA MA LSA DA T Weeks in Treatment: 10 Wound Status Wound Number: 9 Primary Diabetic Wound/Ulcer of the Lower Extremity Etiology: Wound Location: Right, Medial Foot Wound Open Wounding Event: Blister Status: Date Acquired: 06/18/2018 Comorbid Glaucoma, Chronic sinus problems/congestion, Anemia, Sleep Weeks Of Treatment: 65 History: Apnea, Hypertension, Type II Diabetes, Rheumatoid Arthritis, Clustered Wound: No Neuropathy Photos Wound Measurements Length: (cm) 2 Width: (cm) 1.7 Depth: (cm) 0.2 Area: (cm) 2.67 Volume: (cm) 0.534 % Reduction in Area: 75.7% %  Reduction in Volume: 94.6% Epithelialization: Small (1-33%) Tunneling: No Undermining: Yes Starting Position (o'clock): 12 Ending Position (o'clock): 12 Maximum Distance: (cm) 0.5 Wound Description Classification: Grade 2 Wound Margin: Thickened Exudate Amount: Medium Exudate Type: Serosanguineous Exudate Color: red, brown Foul Odor After Cleansing: No Slough/Fibrino No Wound Bed Granulation Amount: Large (67-100%) Exposed Structure Granulation Quality: Pink, Pale Fascia Exposed: No Necrotic Amount: None Present (0%) Fat Layer (Subcutaneous Tissue) Exposed: Yes Tendon Exposed: No Muscle Exposed: No Joint Exposed: No Bone Exposed: No Treatment Notes Wound #9 (Foot) Wound Laterality: Right, Medial Cleanser Byram Ancillary Kit - 15 Day Supply Discharge Instruction: Use supplies as instructed; Kit contains: (15) Saline Bullets; (15) 3x3 Gauze; 15 pr Gloves Soap and Water Discharge Instruction: May shower and wash wound with dial antibacterial soap and water prior to dressing change. Peri-Wound Care Sween Lotion (Moisturizing lotion) Discharge Instruction: Apply moisturizing lotion as directed Topical Primary Dressing Promogran Prisma Matrix, 4.34 (sq in) (silver collagen) Discharge Instruction: Moisten collagen with saline or hydrogel Secondary Dressing Woven Gauze Sponge, Non-Sterile 4x4 in Discharge Instruction: Apply over primary dressing as directed. ABD Pad, 5x9 Discharge Instruction: Apply over primary dressing as directed. Felt 2.5 yds x 5.5 in Discharge Instruction: Apply over primary dressing cut to form callous pad around wound Secured With Kerlix Roll Sterile, 4.5x3.1 (in/yd) Discharge Instruction: Secure with Kerlix as directed. Paper Tape, 2x10 (in/yd) Discharge Instruction: Secure dressing with tape as directed. Compression Wrap Compression Stockings Add-Ons Electronic Signature(s) Signed: 04/27/2020 5:44:40 PM By: Rhae Hammock RN Signed:  04/29/2020 8:29:36 AM By: Sandre Kitty Entered By: Sandre Kitty on 04/27/2020 17:09:42 -------------------------------------------------------------------------------- Vitals Details Patient Name: Date of Service: ENO [place], Aviela J. 04/27/2020 1:45 PM Medical Record Number: [id number] Patient Account Number: [id number] Date of Birth/Sex: Treating RN: April 02, 1955 (65 y.o. Tonita Phoenix, Lauren Primary Care Meera Vasco: MA SO UDI, [place] MA LSA DA T Other Clinician: Referring Keano Guggenheim: Treating Masato Pettie/Extender: Linton Ham MA SO UDI, ELHA MA LSA DA T Weeks in Treatment: 52 Vital Signs Time Taken: 13:54 Temperature (F): 98 Height (in): 68 Pulse (bpm): 70 Weight (lbs): 265 Respiratory Rate (breaths/min): 17 Body Mass Index (BMI): 40.3 Blood Pressure (mmHg): 118/72 Capillary Blood Glucose (mg/dl): 125 Reference Range: 80 - 120 mg / dl Electronic Signature(s) Signed: 04/27/2020 5:44:40 PM By: Rhae Hammock RN Entered By: Rhae Hammock on 04/27/2020 13:54:37

## 2020-04-30 ENCOUNTER — Telehealth: Payer: Self-pay

## 2020-04-30 NOTE — Telephone Encounter (Signed)
RTC, patient asking if she is due for any vaccines.  Per health maintenance review on Epic, TDaP vaccine is the only vaccine currently due.  Pt was informed and instructed she can call her pharmacy to schedule an appt for this vaccine.  She verbalized understanding. No further needs voiced. SChaplin, RN,BSN

## 2020-04-30 NOTE — Telephone Encounter (Signed)
Pls contact pt [phone]

## 2020-05-11 ENCOUNTER — Encounter (HOSPITAL_BASED_OUTPATIENT_CLINIC_OR_DEPARTMENT_OTHER): Payer: Medicare Other | Admitting: Internal Medicine

## 2020-05-12 ENCOUNTER — Other Ambulatory Visit (HOSPITAL_COMMUNITY)
Admission: RE | Admit: 2020-05-12 | Discharge: 2020-05-12 | Disposition: A | Discharge status: Home / Self Care | Payer: Medicare Other | Source: Ambulatory Visit | Attending: Orthopedic Surgery | Admitting: Orthopedic Surgery

## 2020-05-12 DIAGNOSIS — Z01812 Encounter for preprocedural laboratory examination: Secondary | ICD-10-CM | POA: Insufficient documentation

## 2020-05-12 DIAGNOSIS — Z20822 Contact with and (suspected) exposure to covid-19: Secondary | ICD-10-CM | POA: Insufficient documentation

## 2020-05-13 ENCOUNTER — Encounter (HOSPITAL_COMMUNITY): Payer: Self-pay | Admitting: Orthopedic Surgery

## 2020-05-13 LAB — SARS CORONAVIRUS 2 (TAT 6-24 HRS): SARS Coronavirus 2: NEGATIVE

## 2020-05-13 NOTE — Anesthesia Preprocedure Evaluation (Addendum)
Anesthesia Evaluation  Patient identified by MRN, date of birth, ID band Patient awake    Reviewed: Allergy & Precautions, NPO status , Patient's Chart, lab work & pertinent test results  History of Anesthesia Complications Negative for: history of anesthetic complications  Airway Mallampati: II  TM Distance: >3 FB Neck ROM: Full    Dental  (+) Edentulous Upper, Missing,    Pulmonary sleep apnea and Continuous Positive Airway Pressure Ventilation , former smoker,    Pulmonary exam normal        Cardiovascular hypertension, Pt. on medications Normal cardiovascular exam     Neuro/Psych negative neurological ROS  negative psych ROS   GI/Hepatic Neg liver ROS, hiatal hernia, GERD  Medicated and Controlled,  Endo/Other  diabetes, Type 2, Oral Hypoglycemic Agents  Renal/GU Renal InsufficiencyRenal disease  negative genitourinary   Musculoskeletal  (+) Arthritis , Charcot's joint of right foot, ulcer, short Achilles tendon   Abdominal   Peds  Hematology  (+) anemia ,   Anesthesia Other Findings glaucoma  Reproductive/Obstetrics negative OB ROS                            Anesthesia Physical Anesthesia Plan  ASA: III  Anesthesia Plan: General   Post-op Pain Management: GA combined w/ Regional for post-op pain   Induction: Intravenous  PONV Risk Score and Plan: 3 and Treatment may vary due to age or medical condition, Midazolam, Ondansetron and Dexamethasone  Airway Management Planned: LMA  Additional Equipment: None  Intra-op Plan:   Post-operative Plan: Extubation in OR  Informed Consent: I have reviewed the patients History and Physical, chart, labs and discussed the procedure including the risks, benefits and alternatives for the proposed anesthesia with the patient or authorized representative who has indicated his/her understanding and acceptance.     Dental advisory  given  Plan Discussed with: CRNA  Anesthesia Plan Comments:        Anesthesia Quick Evaluation

## 2020-05-13 NOTE — Progress Notes (Signed)
EKG: 04/23/20 CXR: 12/04/09 ECHO: denies Stress Test: denies Cardiac Cath: denies  Fasting Blood Sugar- 85-170 Checks Blood Sugar_2__ times a day  OSA/CPAP: Yes, wears nightly  ASA: Last dose 05/13/20 Blood Thinners: No  covid test 4/26 negative  Anesthesia Review: No  Patient denies shortness of breath, fever, cough, and chest pain at PAT appointment.  Patient verbalized understanding of instructions provided today at the PAT appointment.  Patient asked to review instructions at home and day of surgery.

## 2020-05-14 ENCOUNTER — Encounter (HOSPITAL_COMMUNITY): Payer: Self-pay | Admitting: Orthopedic Surgery

## 2020-05-14 ENCOUNTER — Ambulatory Visit (HOSPITAL_COMMUNITY): Payer: Medicare Other | Admitting: Certified Registered Nurse Anesthetist

## 2020-05-14 ENCOUNTER — Inpatient Hospital Stay (HOSPITAL_COMMUNITY)
Admission: RE | Admit: 2020-05-14 | Discharge: 2020-05-19 | DRG: 982 | Disposition: A | Discharge status: Skilled Nursing Facility | Payer: Medicare Other | Source: Ambulatory Visit | Attending: Orthopedic Surgery | Admitting: Orthopedic Surgery

## 2020-05-14 ENCOUNTER — Encounter (HOSPITAL_COMMUNITY)
Admission: RE | Disposition: A | Discharge status: Skilled Nursing Facility | Payer: Self-pay | Source: Ambulatory Visit | Attending: Orthopedic Surgery

## 2020-05-14 ENCOUNTER — Other Ambulatory Visit: Payer: Self-pay

## 2020-05-14 DIAGNOSIS — Z91040 Latex allergy status: Secondary | ICD-10-CM

## 2020-05-14 DIAGNOSIS — I959 Hypotension, unspecified: Secondary | ICD-10-CM | POA: Diagnosis not present

## 2020-05-14 DIAGNOSIS — Z888 Allergy status to other drugs, medicaments and biological substances status: Secondary | ICD-10-CM | POA: Diagnosis not present

## 2020-05-14 DIAGNOSIS — E11610 Type 2 diabetes mellitus with diabetic neuropathic arthropathy: Secondary | ICD-10-CM | POA: Diagnosis present

## 2020-05-14 DIAGNOSIS — Z885 Allergy status to narcotic agent status: Secondary | ICD-10-CM | POA: Diagnosis not present

## 2020-05-14 DIAGNOSIS — H409 Unspecified glaucoma: Secondary | ICD-10-CM | POA: Diagnosis present

## 2020-05-14 DIAGNOSIS — M14671 Charcot's joint, right ankle and foot: Secondary | ICD-10-CM | POA: Diagnosis present

## 2020-05-14 DIAGNOSIS — Z7984 Long term (current) use of oral hypoglycemic drugs: Secondary | ICD-10-CM

## 2020-05-14 DIAGNOSIS — D649 Anemia, unspecified: Secondary | ICD-10-CM

## 2020-05-14 DIAGNOSIS — Z8249 Family history of ischemic heart disease and other diseases of the circulatory system: Secondary | ICD-10-CM

## 2020-05-14 DIAGNOSIS — N1832 Chronic kidney disease, stage 3b: Secondary | ICD-10-CM | POA: Diagnosis not present

## 2020-05-14 DIAGNOSIS — E1169 Type 2 diabetes mellitus with other specified complication: Secondary | ICD-10-CM | POA: Diagnosis present

## 2020-05-14 DIAGNOSIS — E1122 Type 2 diabetes mellitus with diabetic chronic kidney disease: Secondary | ICD-10-CM | POA: Diagnosis present

## 2020-05-14 DIAGNOSIS — I10 Essential (primary) hypertension: Secondary | ICD-10-CM | POA: Diagnosis not present

## 2020-05-14 DIAGNOSIS — Z87891 Personal history of nicotine dependence: Secondary | ICD-10-CM | POA: Diagnosis not present

## 2020-05-14 DIAGNOSIS — Z20822 Contact with and (suspected) exposure to covid-19: Secondary | ICD-10-CM | POA: Diagnosis present

## 2020-05-14 DIAGNOSIS — G4733 Obstructive sleep apnea (adult) (pediatric): Secondary | ICD-10-CM | POA: Diagnosis present

## 2020-05-14 DIAGNOSIS — N183 Chronic kidney disease, stage 3 unspecified: Secondary | ICD-10-CM | POA: Diagnosis present

## 2020-05-14 DIAGNOSIS — D62 Acute posthemorrhagic anemia: Secondary | ICD-10-CM | POA: Diagnosis not present

## 2020-05-14 DIAGNOSIS — Z7982 Long term (current) use of aspirin: Secondary | ICD-10-CM | POA: Diagnosis not present

## 2020-05-14 DIAGNOSIS — E118 Type 2 diabetes mellitus with unspecified complications: Secondary | ICD-10-CM

## 2020-05-14 DIAGNOSIS — N1831 Chronic kidney disease, stage 3a: Secondary | ICD-10-CM | POA: Diagnosis present

## 2020-05-14 DIAGNOSIS — Z79899 Other long term (current) drug therapy: Secondary | ICD-10-CM

## 2020-05-14 DIAGNOSIS — E785 Hyperlipidemia, unspecified: Secondary | ICD-10-CM | POA: Diagnosis present

## 2020-05-14 DIAGNOSIS — L97519 Non-pressure chronic ulcer of other part of right foot with unspecified severity: Secondary | ICD-10-CM | POA: Diagnosis present

## 2020-05-14 DIAGNOSIS — I129 Hypertensive chronic kidney disease with stage 1 through stage 4 chronic kidney disease, or unspecified chronic kidney disease: Secondary | ICD-10-CM | POA: Diagnosis present

## 2020-05-14 DIAGNOSIS — M2141 Flat foot [pes planus] (acquired), right foot: Secondary | ICD-10-CM | POA: Diagnosis present

## 2020-05-14 DIAGNOSIS — Z833 Family history of diabetes mellitus: Secondary | ICD-10-CM | POA: Diagnosis not present

## 2020-05-14 DIAGNOSIS — D509 Iron deficiency anemia, unspecified: Secondary | ICD-10-CM | POA: Diagnosis present

## 2020-05-14 DIAGNOSIS — E11621 Type 2 diabetes mellitus with foot ulcer: Secondary | ICD-10-CM | POA: Diagnosis present

## 2020-05-14 DIAGNOSIS — K219 Gastro-esophageal reflux disease without esophagitis: Secondary | ICD-10-CM | POA: Diagnosis present

## 2020-05-14 DIAGNOSIS — E119 Type 2 diabetes mellitus without complications: Secondary | ICD-10-CM

## 2020-05-14 HISTORY — PX: FOOT ARTHRODESIS: SHX1655

## 2020-05-14 LAB — HEMOGLOBIN A1C
Hgb A1c MFr Bld: 6.5 % — ABNORMAL HIGH (ref 4.8–5.6)
Mean Plasma Glucose: 139.85 mg/dL

## 2020-05-14 LAB — BASIC METABOLIC PANEL
Anion gap: 8 (ref 5–15)
BUN: 28 mg/dL — ABNORMAL HIGH (ref 8–23)
CO2: 25 mmol/L (ref 22–32)
Calcium: 9.6 mg/dL (ref 8.9–10.3)
Chloride: 103 mmol/L (ref 98–111)
Creatinine, Ser: 1.44 mg/dL — ABNORMAL HIGH (ref 0.44–1.00)
GFR, Estimated: 41 mL/min — ABNORMAL LOW (ref >60)
Glucose, Bld: 113 mg/dL — ABNORMAL HIGH (ref 70–99)
Potassium: 4.2 mmol/L (ref 3.5–5.1)
Sodium: 136 mmol/L (ref 135–145)

## 2020-05-14 LAB — CBC
HCT: 34.1 % — ABNORMAL LOW (ref 36.0–46.0)
Hemoglobin: 10.9 g/dL — ABNORMAL LOW (ref 12.0–15.0)
MCH: 28.5 pg (ref 26.0–34.0)
MCHC: 32 g/dL (ref 30.0–36.0)
MCV: 89 fL (ref 80.0–100.0)
Platelets: 208 10*3/uL (ref 150–400)
RBC: 3.83 MIL/uL — ABNORMAL LOW (ref 3.87–5.11)
RDW: 15.3 % (ref 11.5–15.5)
WBC: 8.2 10*3/uL (ref 4.0–10.5)
nRBC: 0 % (ref 0.0–0.2)

## 2020-05-14 LAB — SURGICAL PCR SCREEN
MRSA, PCR: NEGATIVE
Staphylococcus aureus: POSITIVE — AB

## 2020-05-14 LAB — GLUCOSE, CAPILLARY
Glucose-Capillary: 135 mg/dL — ABNORMAL HIGH (ref 70–99)
Glucose-Capillary: 175 mg/dL — ABNORMAL HIGH (ref 70–99)
Glucose-Capillary: 146 mg/dL — ABNORMAL HIGH (ref 70–99)
Glucose-Capillary: 154 mg/dL — ABNORMAL HIGH (ref 70–99)

## 2020-05-14 SURGERY — FUSION, JOINT, FOOT
Anesthesia: General | Site: Foot | Laterality: Right

## 2020-05-14 MED ORDER — ACETAMINOPHEN 500 MG PO TABS
ORAL_TABLET | ORAL | Status: AC
  Administered 2020-05-14: 1000 mg via ORAL
  Filled 2020-05-14: qty 2

## 2020-05-14 MED ORDER — CHLORHEXIDINE GLUCONATE 0.12 % MT SOLN
15.0000 mL | Freq: Once | OROMUCOSAL | Status: AC
  Administered 2020-05-14: 15 mL via OROMUCOSAL

## 2020-05-14 MED ORDER — ONDANSETRON HCL 4 MG/2ML IJ SOLN
INTRAMUSCULAR | Status: AC
  Filled 2020-05-14: qty 2

## 2020-05-14 MED ORDER — OXYCODONE HCL 5 MG PO TABS
10.0000 mg | ORAL_TABLET | ORAL | Status: DC | PRN
  Administered 2020-05-16: 10 mg via ORAL
  Filled 2020-05-14: qty 2

## 2020-05-14 MED ORDER — DEXAMETHASONE SODIUM PHOSPHATE 10 MG/ML IJ SOLN
INTRAMUSCULAR | Status: AC
  Filled 2020-05-14: qty 1

## 2020-05-14 MED ORDER — ONDANSETRON HCL 4 MG/2ML IJ SOLN: 4.0000 mg | Freq: Four times a day (QID) | INTRAMUSCULAR | Status: DC | PRN

## 2020-05-14 MED ORDER — CEFAZOLIN SODIUM-DEXTROSE 2-4 GM/100ML-% IV SOLN
2.0000 g | INTRAVENOUS | Status: AC
  Administered 2020-05-14: 2 g via INTRAVENOUS
  Administered 2020-05-14: 1 g via INTRAVENOUS
  Filled 2020-05-14: qty 100

## 2020-05-14 MED ORDER — CLONIDINE HCL (ANALGESIA) 100 MCG/ML EP SOLN
EPIDURAL | Status: DC | PRN
  Administered 2020-05-14: 67 ug
  Administered 2020-05-14: 33 ug

## 2020-05-14 MED ORDER — BUPIVACAINE-EPINEPHRINE (PF) 0.5% -1:200000 IJ SOLN
INTRAMUSCULAR | Status: DC | PRN
  Administered 2020-05-14: 20 mL via PERINEURAL
  Administered 2020-05-14: 10 mL via PERINEURAL

## 2020-05-14 MED ORDER — LACTATED RINGERS IV SOLN: INTRAVENOUS | Status: DC

## 2020-05-14 MED ORDER — SODIUM CHLORIDE 0.9 % IV SOLN: INTRAVENOUS | Status: DC

## 2020-05-14 MED ORDER — INSULIN ASPART 100 UNIT/ML IJ SOLN
0.0000 [IU] | Freq: Three times a day (TID) | INTRAMUSCULAR | Status: DC
  Administered 2020-05-15: 2 [IU] via SUBCUTANEOUS
  Administered 2020-05-15: 1 [IU] via SUBCUTANEOUS
  Administered 2020-05-15: 2 [IU] via SUBCUTANEOUS
  Administered 2020-05-16 (×3): 1 [IU] via SUBCUTANEOUS
  Administered 2020-05-17 (×2): 2 [IU] via SUBCUTANEOUS
  Administered 2020-05-17: 1 [IU] via SUBCUTANEOUS
  Administered 2020-05-18: 2 [IU] via SUBCUTANEOUS
  Administered 2020-05-18: 1 [IU] via SUBCUTANEOUS
  Administered 2020-05-18: 3 [IU] via SUBCUTANEOUS
  Administered 2020-05-19: 1 [IU] via SUBCUTANEOUS

## 2020-05-14 MED ORDER — MIDAZOLAM HCL 5 MG/5ML IJ SOLN
INTRAMUSCULAR | Status: DC | PRN
  Administered 2020-05-14 (×2): 1 mg via INTRAVENOUS

## 2020-05-14 MED ORDER — ATORVASTATIN CALCIUM 40 MG PO TABS
40.0000 mg | ORAL_TABLET | Freq: Every day | ORAL | Status: DC
  Administered 2020-05-14 – 2020-05-19 (×6): 40 mg via ORAL
  Filled 2020-05-14 (×6): qty 1

## 2020-05-14 MED ORDER — SITAGLIP PHOS-METFORMIN HCL ER 50-500 MG PO TB24: 1.0000 | ORAL_TABLET | Freq: Two times a day (BID) | ORAL | Status: DC

## 2020-05-14 MED ORDER — SENNA 8.6 MG PO TABS
1.0000 | ORAL_TABLET | Freq: Two times a day (BID) | ORAL | Status: DC
  Administered 2020-05-14 – 2020-05-19 (×11): 8.6 mg via ORAL
  Filled 2020-05-14 (×12): qty 1

## 2020-05-14 MED ORDER — ACETAMINOPHEN 500 MG PO TABS: 1000.0000 mg | ORAL_TABLET | Freq: Once | ORAL | Status: AC

## 2020-05-14 MED ORDER — CEFAZOLIN SODIUM 1 G IJ SOLR
INTRAMUSCULAR | Status: AC
  Filled 2020-05-14: qty 10

## 2020-05-14 MED ORDER — ORAL CARE MOUTH RINSE: 15.0000 mL | Freq: Once | OROMUCOSAL | Status: AC

## 2020-05-14 MED ORDER — PROPOFOL 10 MG/ML IV BOLUS
INTRAVENOUS | Status: AC
  Filled 2020-05-14: qty 20

## 2020-05-14 MED ORDER — ENOXAPARIN SODIUM 40 MG/0.4ML IJ SOSY
40.0000 mg | PREFILLED_SYRINGE | INTRAMUSCULAR | Status: DC
  Administered 2020-05-15 – 2020-05-19 (×5): 40 mg via SUBCUTANEOUS
  Filled 2020-05-14 (×5): qty 0.4

## 2020-05-14 MED ORDER — NETARSUDIL-LATANOPROST 0.02-0.005 % OP SOLN: 1.0000 [drp] | Freq: Every day | OPHTHALMIC | Status: DC

## 2020-05-14 MED ORDER — VANCOMYCIN HCL 1000 MG IV SOLR
INTRAVENOUS | Status: AC
  Filled 2020-05-14: qty 2000

## 2020-05-14 MED ORDER — LISINOPRIL 10 MG PO TABS
10.0000 mg | ORAL_TABLET | Freq: Every day | ORAL | Status: DC
  Filled 2020-05-14: qty 1

## 2020-05-14 MED ORDER — DIPHENHYDRAMINE HCL 12.5 MG/5ML PO ELIX: 12.5000 mg | ORAL_SOLUTION | ORAL | Status: DC | PRN

## 2020-05-14 MED ORDER — OXYCODONE HCL 5 MG/5ML PO SOLN: 5.0000 mg | Freq: Once | ORAL | Status: DC | PRN

## 2020-05-14 MED ORDER — VANCOMYCIN HCL 1000 MG IV SOLR
INTRAVENOUS | Status: DC | PRN
  Administered 2020-05-14: 1000 mg via TOPICAL

## 2020-05-14 MED ORDER — BRIMONIDINE TARTRATE 0.2 % OP SOLN
1.0000 [drp] | Freq: Two times a day (BID) | OPHTHALMIC | Status: DC
  Administered 2020-05-14 – 2020-05-19 (×10): 1 [drp] via OPHTHALMIC
  Filled 2020-05-14: qty 5

## 2020-05-14 MED ORDER — DOCUSATE SODIUM 100 MG PO CAPS
100.0000 mg | ORAL_CAPSULE | Freq: Two times a day (BID) | ORAL | Status: DC
  Administered 2020-05-14 – 2020-05-19 (×11): 100 mg via ORAL
  Filled 2020-05-14 (×12): qty 1

## 2020-05-14 MED ORDER — EPHEDRINE SULFATE-NACL 50-0.9 MG/10ML-% IV SOSY
PREFILLED_SYRINGE | INTRAVENOUS | Status: DC | PRN
  Administered 2020-05-14 (×2): 10 mg via INTRAVENOUS

## 2020-05-14 MED ORDER — ONDANSETRON HCL 4 MG PO TABS: 4.0000 mg | ORAL_TABLET | Freq: Four times a day (QID) | ORAL | Status: DC | PRN

## 2020-05-14 MED ORDER — METHOCARBAMOL 1000 MG/10ML IJ SOLN
500.0000 mg | Freq: Four times a day (QID) | INTRAVENOUS | Status: DC | PRN
  Filled 2020-05-14: qty 5

## 2020-05-14 MED ORDER — BRIMONIDINE TARTRATE-TIMOLOL 0.2-0.5 % OP SOLN: 1.0000 [drp] | Freq: Two times a day (BID) | OPHTHALMIC | Status: DC

## 2020-05-14 MED ORDER — MIDAZOLAM HCL 2 MG/2ML IJ SOLN
INTRAMUSCULAR | Status: AC
  Filled 2020-05-14: qty 2

## 2020-05-14 MED ORDER — ORAL CARE MOUTH RINSE: 15.0000 mL | Freq: Once | OROMUCOSAL | Status: DC

## 2020-05-14 MED ORDER — PROMETHAZINE HCL 25 MG/ML IJ SOLN: 6.2500 mg | INTRAMUSCULAR | Status: DC | PRN

## 2020-05-14 MED ORDER — VITAMIN D 25 MCG (1000 UNIT) PO TABS
2000.0000 [IU] | ORAL_TABLET | Freq: Every day | ORAL | Status: DC
  Administered 2020-05-14 – 2020-05-19 (×6): 2000 [IU] via ORAL
  Filled 2020-05-14 (×8): qty 2

## 2020-05-14 MED ORDER — PANTOPRAZOLE SODIUM 20 MG PO TBEC
20.0000 mg | DELAYED_RELEASE_TABLET | Freq: Two times a day (BID) | ORAL | Status: DC
  Administered 2020-05-14 – 2020-05-19 (×12): 20 mg via ORAL
  Filled 2020-05-14 (×12): qty 1

## 2020-05-14 MED ORDER — ACETAMINOPHEN 325 MG PO TABS
325.0000 mg | ORAL_TABLET | Freq: Four times a day (QID) | ORAL | Status: DC | PRN
  Administered 2020-05-15 – 2020-05-17 (×3): 650 mg via ORAL
  Administered 2020-05-18: 325 mg via ORAL
  Administered 2020-05-19 (×2): 650 mg via ORAL
  Filled 2020-05-14 (×6): qty 2

## 2020-05-14 MED ORDER — ONDANSETRON HCL 4 MG/2ML IJ SOLN
INTRAMUSCULAR | Status: DC | PRN
  Administered 2020-05-14: 4 mg via INTRAVENOUS

## 2020-05-14 MED ORDER — HYDROMORPHONE HCL 1 MG/ML IJ SOLN: 0.5000 mg | INTRAMUSCULAR | Status: DC | PRN

## 2020-05-14 MED ORDER — PROPOFOL 10 MG/ML IV BOLUS
INTRAVENOUS | Status: DC | PRN
  Administered 2020-05-14: 10 mg via INTRAVENOUS
  Administered 2020-05-14: 170 mg via INTRAVENOUS

## 2020-05-14 MED ORDER — CYCLOSPORINE 0.05 % OP EMUL
1.0000 [drp] | Freq: Two times a day (BID) | OPHTHALMIC | Status: DC
  Administered 2020-05-14 – 2020-05-19 (×11): 1 [drp] via OPHTHALMIC
  Filled 2020-05-14 (×11): qty 1

## 2020-05-14 MED ORDER — INSULIN ASPART 100 UNIT/ML IJ SOLN
0.0000 [IU] | Freq: Three times a day (TID) | INTRAMUSCULAR | Status: DC
  Administered 2020-05-14: 3 [IU] via SUBCUTANEOUS

## 2020-05-14 MED ORDER — OXYCODONE HCL 5 MG PO TABS: 5.0000 mg | ORAL_TABLET | Freq: Once | ORAL | Status: DC | PRN

## 2020-05-14 MED ORDER — LINAGLIPTIN 5 MG PO TABS
5.0000 mg | ORAL_TABLET | Freq: Every day | ORAL | Status: DC
  Administered 2020-05-14: 5 mg via ORAL
  Filled 2020-05-14: qty 1

## 2020-05-14 MED ORDER — TIMOLOL MALEATE 0.5 % OP SOLN
1.0000 [drp] | Freq: Two times a day (BID) | OPHTHALMIC | Status: DC
  Administered 2020-05-14 – 2020-05-19 (×10): 1 [drp] via OPHTHALMIC
  Filled 2020-05-14: qty 5

## 2020-05-14 MED ORDER — FENTANYL CITRATE (PF) 250 MCG/5ML IJ SOLN
INTRAMUSCULAR | Status: DC | PRN
  Administered 2020-05-14 (×3): 25 ug via INTRAVENOUS
  Administered 2020-05-14 (×2): 50 ug via INTRAVENOUS

## 2020-05-14 MED ORDER — METFORMIN HCL 500 MG PO TABS
500.0000 mg | ORAL_TABLET | Freq: Two times a day (BID) | ORAL | Status: DC
  Administered 2020-05-14: 500 mg via ORAL
  Filled 2020-05-14: qty 1

## 2020-05-14 MED ORDER — OXYCODONE HCL 5 MG PO TABS
5.0000 mg | ORAL_TABLET | ORAL | Status: DC | PRN
  Administered 2020-05-14 – 2020-05-19 (×3): 10 mg via ORAL
  Filled 2020-05-14 (×3): qty 2

## 2020-05-14 MED ORDER — FENTANYL CITRATE (PF) 100 MCG/2ML IJ SOLN: 25.0000 ug | INTRAMUSCULAR | Status: DC | PRN

## 2020-05-14 MED ORDER — 0.9 % SODIUM CHLORIDE (POUR BTL) OPTIME
TOPICAL | Status: DC | PRN
  Administered 2020-05-14 (×3): 1000 mL

## 2020-05-14 MED ORDER — FENTANYL CITRATE (PF) 100 MCG/2ML IJ SOLN
INTRAMUSCULAR | Status: AC
  Filled 2020-05-14: qty 2

## 2020-05-14 MED ORDER — METHOCARBAMOL 500 MG PO TABS
500.0000 mg | ORAL_TABLET | Freq: Four times a day (QID) | ORAL | Status: DC | PRN
  Administered 2020-05-15 – 2020-05-19 (×5): 500 mg via ORAL
  Filled 2020-05-14 (×5): qty 1

## 2020-05-14 MED ORDER — DEXAMETHASONE SODIUM PHOSPHATE 10 MG/ML IJ SOLN
INTRAMUSCULAR | Status: DC | PRN
  Administered 2020-05-14: 5 mg via INTRAVENOUS

## 2020-05-14 MED ORDER — FERROUS SULFATE 325 (65 FE) MG PO TABS
325.0000 mg | ORAL_TABLET | Freq: Every day | ORAL | Status: DC
  Administered 2020-05-15 – 2020-05-19 (×5): 325 mg via ORAL
  Filled 2020-05-14 (×5): qty 1

## 2020-05-14 MED ORDER — CHLORHEXIDINE GLUCONATE 0.12 % MT SOLN
15.0000 mL | Freq: Once | OROMUCOSAL | Status: DC
  Filled 2020-05-14: qty 15

## 2020-05-14 MED ORDER — EPHEDRINE 5 MG/ML INJ
INTRAVENOUS | Status: AC
  Filled 2020-05-14: qty 10

## 2020-05-14 MED ORDER — LIDOCAINE 2% (20 MG/ML) 5 ML SYRINGE
INTRAMUSCULAR | Status: DC | PRN
  Administered 2020-05-14: 80 mg via INTRAVENOUS

## 2020-05-14 MED ORDER — FENTANYL CITRATE (PF) 250 MCG/5ML IJ SOLN
INTRAMUSCULAR | Status: AC
  Filled 2020-05-14: qty 5

## 2020-05-14 MED ORDER — LIDOCAINE 2% (20 MG/ML) 5 ML SYRINGE
INTRAMUSCULAR | Status: AC
  Filled 2020-05-14: qty 5

## 2020-05-14 MED ORDER — MUPIROCIN 2 % EX OINT
1.0000 "application " | TOPICAL_OINTMENT | Freq: Two times a day (BID) | CUTANEOUS | Status: AC
  Administered 2020-05-14 – 2020-05-18 (×9): 1 via NASAL
  Filled 2020-05-14 (×2): qty 22

## 2020-05-14 MED ORDER — VITAMIN B-12 1000 MCG PO TABS
500.0000 ug | ORAL_TABLET | Freq: Every day | ORAL | Status: DC
  Administered 2020-05-14 – 2020-05-19 (×6): 500 ug via ORAL
  Filled 2020-05-14 (×7): qty 1

## 2020-05-14 SURGICAL SUPPLY — 80 items
BANDAGE ESMARK 6X9 LF (GAUZE/BANDAGES/DRESSINGS) ×1 IMPLANT
BIT DRILL 5/64X5 DISP (BIT) ×2 IMPLANT
BIT DRILL 7/64X5 DISP (BIT) ×2 IMPLANT
BIT DRILL CALIBRATED 4.3X320MM (BIT) ×1 IMPLANT
BIT DRILL CANN 7X200 (BIT) ×2 IMPLANT
BLADE AVERAGE 25X9 (BLADE) ×2 IMPLANT
BLADE MICRO SAGITTAL (BLADE) ×2 IMPLANT
BLADE SAW SGTL 13X75X1.27 (BLADE) IMPLANT
BLADE SURG 10 STRL SS (BLADE) ×2 IMPLANT
BLADE SURG 15 STRL LF DISP TIS (BLADE) ×5 IMPLANT
BLADE SURG 15 STRL SS (BLADE) ×10
BNDG COHESIVE 4X5 TAN STRL (GAUZE/BANDAGES/DRESSINGS) ×2 IMPLANT
BNDG COHESIVE 6X5 TAN STRL LF (GAUZE/BANDAGES/DRESSINGS) ×2 IMPLANT
BNDG ESMARK 6X9 LF (GAUZE/BANDAGES/DRESSINGS) ×2
CANISTER SUCT 3000ML PPV (MISCELLANEOUS) ×2 IMPLANT
CHLORAPREP W/TINT 26 (MISCELLANEOUS) ×2 IMPLANT
COVER SURGICAL LIGHT HANDLE (MISCELLANEOUS) ×2 IMPLANT
COVER WAND RF STERILE (DRAPES) ×2 IMPLANT
CUFF TOURN SGL QUICK 34 (TOURNIQUET CUFF) ×2
CUFF TOURN SGL QUICK 42 (TOURNIQUET CUFF) IMPLANT
CUFF TRNQT CYL 34X4.125X (TOURNIQUET CUFF) ×1 IMPLANT
DRAPE C-ARM 42X72 X-RAY (DRAPES) IMPLANT
DRAPE OEC MINIVIEW 54X84 (DRAPES) ×2 IMPLANT
DRAPE U-SHAPE 47X51 STRL (DRAPES) IMPLANT
DRILL CALIBRATED 4.3X320MM (BIT) ×2
DRSG ADAPTIC 3X8 NADH LF (GAUZE/BANDAGES/DRESSINGS) IMPLANT
DRSG MEPITEL 4X7.2 (GAUZE/BANDAGES/DRESSINGS) ×4 IMPLANT
DRSG PAD ABDOMINAL 8X10 ST (GAUZE/BANDAGES/DRESSINGS) ×4 IMPLANT
ELECT REM PT RETURN 9FT ADLT (ELECTROSURGICAL) ×2
ELECTRODE REM PT RTRN 9FT ADLT (ELECTROSURGICAL) ×1 IMPLANT
GAUZE SPONGE 4X4 12PLY STRL (GAUZE/BANDAGES/DRESSINGS) ×4 IMPLANT
GLOVE SRG 8 PF TXTR STRL LF DI (GLOVE) ×3 IMPLANT
GLOVE SURG POLYISO LF SZ8 (GLOVE) ×8 IMPLANT
GLOVE SURG UNDER POLY LF SZ8 (GLOVE) ×6
GOWN STRL REUS W/ TWL LRG LVL3 (GOWN DISPOSABLE) ×1 IMPLANT
GOWN STRL REUS W/ TWL XL LVL3 (GOWN DISPOSABLE) ×3 IMPLANT
GOWN STRL REUS W/TWL LRG LVL3 (GOWN DISPOSABLE) ×2
GOWN STRL REUS W/TWL XL LVL3 (GOWN DISPOSABLE) ×6
GUIDEPIN 3.2X17.5 THRD DISP (PIN) ×4 IMPLANT
GUIDEWIRE 2.6X80 BEAD TIP (WIRE) ×1 IMPLANT
GUIDWIRE 2.6X80 BEAD TIP (WIRE) ×2
K-WIRE 1.8 (WIRE) ×4
K-WIRE FX200X1.8XTROC TIP (WIRE) ×2
KIT BASIN OR (CUSTOM PROCEDURE TRAY) ×2 IMPLANT
KIT TURNOVER KIT B (KITS) ×2 IMPLANT
KWIRE FX200X1.8XTROC TIP (WIRE) ×2 IMPLANT
MILL MEDIUM DISP (BLADE) ×2 IMPLANT
NAIL LOCK ANKLE 11X150 (Nail) ×2 IMPLANT
NEEDLE 22X1 1/2 (OR ONLY) (NEEDLE) ×2 IMPLANT
NS IRRIG 1000ML POUR BTL (IV SOLUTION) ×6 IMPLANT
PACK ORTHO EXTREMITY (CUSTOM PROCEDURE TRAY) ×2 IMPLANT
PAD ARMBOARD 7.5X6 YLW CONV (MISCELLANEOUS) ×4 IMPLANT
PAD CAST 4YDX4 CTTN HI CHSV (CAST SUPPLIES) ×2 IMPLANT
PADDING CAST COTTON 4X4 STRL (CAST SUPPLIES) ×4
SCREW CANN 5.0X50MM (Screw) ×2 IMPLANT
SCREW CANN 5.0X55 (Screw) ×2 IMPLANT
SCREW CANN PT 50X5XCANN NS MTP (Screw) ×1 IMPLANT
SCREW CORT TI DBL LEAD 5X38 (Screw) ×2 IMPLANT
SCREW CORT TI DBL LEAD 5X40 (Screw) ×2 IMPLANT
SCREW CORT TI DBL LEAD 5X46 (Screw) ×4 IMPLANT
SCREW CORT TI DBL LEAD 5X60 (Screw) ×2 IMPLANT
SOAP 2 % CHG 4 OZ (WOUND CARE) ×2 IMPLANT
SPONGE LAP 18X18 RF (DISPOSABLE) ×4 IMPLANT
SPONGE SURGIFOAM ABS GEL 12-7 (HEMOSTASIS) IMPLANT
STAPLER VISISTAT 35W (STAPLE) ×2 IMPLANT
SUCTION FRAZIER HANDLE 10FR (MISCELLANEOUS) ×2
SUCTION TUBE FRAZIER 10FR DISP (MISCELLANEOUS) ×1 IMPLANT
SUT ETHILON 2 0 FS 18 (SUTURE) ×8 IMPLANT
SUT ETHILON 3 0 FSL (SUTURE) ×2 IMPLANT
SUT MNCRL AB 3-0 PS2 18 (SUTURE) ×2 IMPLANT
SUT PDS AB 0 CT 36 (SUTURE) ×2 IMPLANT
SUT PROLENE 3 0 PS 2 (SUTURE) IMPLANT
SUT VIC AB 2-0 CT1 27 (SUTURE) ×2
SUT VIC AB 2-0 CT1 TAPERPNT 27 (SUTURE) ×1 IMPLANT
SYR CONTROL 10ML LL (SYRINGE) ×2 IMPLANT
TOWEL GREEN STERILE (TOWEL DISPOSABLE) ×2 IMPLANT
TOWEL GREEN STERILE FF (TOWEL DISPOSABLE) ×2 IMPLANT
TUBE CONNECTING 12X1/4 (SUCTIONS) ×2 IMPLANT
WASHER FLAT 5.0MM (Washer) ×2 IMPLANT
WATER STERILE IRR 1000ML POUR (IV SOLUTION) ×2 IMPLANT

## 2020-05-14 NOTE — Consult Note (Signed)
Consult Note   PLEASE NOTE THAT [employer]AGON DI[employer]ATION SOFTWARE WAS USED IN THE CONSTRUCTION OF THIS NOTE.   [name] XLK:[id number] DOB: Jan 14, 1956 DOA: 05/14/2020  PCP: Chevis Pretty, MD Patient coming from: home   I have personally briefly reviewed patient's old medical records in [facility]  Consulted by: [name] of the orthopedic surgery service.  Reason for Consult: Assistance with postoperative management of multiple chronic medical problems, including that of type 2 diabetes mellitus and stage IIIa chronic kidney disease.   HPI: [name] is a 65 y.o. female with medical history significant for type 2 diabetes mellitus with most recent hemoglobin A1c 6.3% in February 2022 and complicated by right Charcot neuroarthropathy, stage IIIa chronic kidney disease with baseline creatinine 1.2-1.5, hyperlipidemia, hypertension, chronic normocytic anemia with baseline hemoglobin 9-11, who is admitted to [facility] on 05/14/2020 by the orthopedic surgery service for surgical treatment of right Charcot neuropathy.   The patient has a history of type 2 diabetes mellitus, with most recent hemoglobin A1c found to be 6.3% when checked on 03/11/2020.  She is not on exogenous insulin as an outpatient, and is on Janumet as her sole outpatient oral hypoglycemic agent.  Her diabetes has been complicated by right Charcot neuroarthropathy associated with a nonhealing ulcer of the right plantar midfoot.  In spite of outpatient management that included management conducted by the wound care center, patient experienced progressive collapse of her right arch, associated with right subtalar dislocation and right talonavicular dislocation.  In the context of progression of her right Charcot neuroarthropathy in spite of his outpatient measures, the patient elected for surgical intervention, and was subsequently admitted by the orthopedic surgery team to [facility] for surgical  intervention of the above on 05/14/2020.  On the morning of 05/14/2020, she underwent the following extensive procedures on her right foot performed by [name] of orthopedic surgery which included : Open treatment of right subtalar dislocation, open treatment of right talonavicular dislocation with right ankle and right subtalar arthrodesis, and arthrodesis of right transverse tarsal joints with corrective ostomy.  This was performed under general anesthesia, patient was extubated in the OR, recovered in the PACU, with no significant intraoperative complications reported.  Per orthopedic surgery, it appears that the patient will be nonweightbearing status on the right lower extremity for the next 3 months.  Additionally, orthopedic surgery recommends inpatient DVT prophylaxis with Lovenox, followed by aspirin as an outpatient.  Postoperatively, [name] of the orthopedic surgery service, consulted the hospitalist service for assistance with postoperative management of the patient's multiple chronic medical problems, including that of type 2 diabetes mellitus and stage IIIa chronic kidney disease.  Per chart review, it appears the patient's baseline creatinine is in the range of 1.2-1.5, with most recent prior value of 1.2 on 03/11/2020.  Additionally, she has a documented history of chronic normocytic anemia, with baseline hemoglobin in the range of 9-11, with most recent prior hemoglobin found to be 9.3 on 02/28/2019.  Her medical history is also notable for essential hypertension, for which she is on lisinopril as her sole outpatient antihypertensive medication.   Notable preoperative labs include the following: Screening nasopharyngeal COVID-19 PCR performed on 05/12/20 was negative.  Preoperative labs performed earlier this morning were notable for the following: BMP was notable for creatinine 1.44, glucose 113.  CBC notable for hemoglobin 10.9 associated with normocytic and normochromic findings as well  as a nonelevated RDW.  Historically, the patient is on a daily  baby aspirin as an outpatient, but otherwise on no blood thinning agents at home.  At the time of my postoperative encounter with the patient, she is without acute complaint at this time.  Denies any chest pain, shortness of breath, palpitations, diaphoresis.  She also denies any recent nausea, vomiting, diarrhea, or abdominal pain.  No subjective fever, chills, rigors, or generalized myalgias.  No recent known COVID-19 exposures.  Denies any recent dysuria, gross hematuria, or change in urinary urgency/frequency.  No recent cough or wheezing.    Review of Systems: As per HPI otherwise 10 point review of systems negative.   Past Medical History:  Diagnosis Date  . Arthritis   . Chronic normocytic anemia   . CKD (chronic kidney disease) stage 3, GFR 30-59 ml/min (HCC) 02/02/2013  . Diabetes mellitus type II, controlled (HCC) 06/17/2009  . Diabetic ulcer of both feet (HCC) 01/07/2010   Qualifier: Diagnosis of  By: [name] MD, Amanjot    . Essential hypertension 06/17/2009  . GERD (gastroesophageal reflux disease)   . Glaucoma   . H/O hiatal hernia   . Hyperlipidemia   . MRSA (methicillin resistant Staphylococcus aureus)   . Obstructive sleep apnea 05/26/2010    Past Surgical History:  Procedure Laterality Date  . ACHILLES TENDON LENGTHENING Left 11/06/2014   Procedure: LEFT ACHILLES TENDON LENGTHENING, ;  Surgeon: Toni Arthurs, MD;  Location: [facility] OR;  Service: Orthopedics;  Laterality: Left;  . APPENDECTOMY  1975  . FOOT ARTHRODESIS Left 11/06/2014   Procedure: TALONAVICULAR CANCELLOUS CUBOID ARTHRODESIS WITH CORRECTIVE OSTEOTOMY ;  Surgeon: Toni Arthurs, MD;  Location: [facility] OR;  Service: Orthopedics;  Laterality: Left;  . HARDWARE REMOVAL Left 02/19/2015   Procedure: REMOVAL OF LEFT FOOT EXTERNAL FIXATOR, CASTING OF LEFT ANKLE ;  Surgeon: Toni Arthurs, MD;  Location: [facility];  Service: Orthopedics;  Laterality: Left;   . I & D EXTREMITY Left 2011   "ulcer on my foot got infected" (01/30/2013)  . MULTIPLE TOOTH EXTRACTIONS Bilateral   . OOPHORECTOMY Bilateral 1975-1976    Social History:  reports that she quit smoking about 47 years ago. Her smoking use included cigarettes. She smoked 0.50 packs per day for 0.00 years. She has never used smokeless tobacco. She reports that she does not drink alcohol and does not use drugs.   Allergies  Allergen Reactions  . Codeine Itching and Swelling    swelling in throat  . Pravastatin Other (See Comments)    cramps  . Latex Rash    Family History  Problem Relation Age of Onset  . Diabetes Mother   . Hypertension Mother   . Dementia Mother   . Heart disease Father   . Stroke Father   . Diabetes Father   . Hypertension Father   . Gout Father   . Deep vein thrombosis Father   . Breast cancer Cousin   . Breast cancer Cousin      Prior to Admission medications   Medication Sig Start Date End Date Taking? Authorizing Provider  acetaminophen (TYLENOL) 500 MG tablet Take 500-1,000 mg by mouth every 6 (six) hours as needed for moderate pain or headache.   Yes [provider]  aspirin EC 81 MG tablet Take 1 tablet (81 mg total) by mouth daily. 10/11/17  Yes Reymundo Poll, MD  atorvastatin (LIPITOR) 40 MG tablet TAKE ONE TABLET BY MOUTH DAILY Patient taking differently: Take 40 mg by mouth daily. 12/30/19  Yes Elige Radon, MD  Biotin 1000 MCG tablet  Take 1,000 mcg by mouth daily.   Yes [provider]  brimonidine-timolol (COMBIGAN) 0.2-0.5 % ophthalmic solution Place 1 drop into both eyes every 12 (twelve) hours. Place one drop to both eyes twice daily for glaucoma Patient taking differently: Place 1 drop into both eyes every 12 (twelve) hours. 08/25/16  Yes Scherrie Gerlach, MD  calcium carbonate (TUMS - DOSED IN MG ELEMENTAL CALCIUM) 500 MG chewable tablet Chew 500-1,000 mg by mouth daily as needed for indigestion or heartburn.   Yes  [provider]  Calcium Carbonate-Vitamin D (CALCIUM 600+D PO) Take 1 tablet by mouth daily.   Yes [provider]  Cholecalciferol (VITAMIN D) 50 MCG (2000 UT) tablet Take 2,000 Units by mouth daily.   Yes [provider]  COLLAGEN PO Take 1-2 tablets by mouth See admin instructions. Take 2 tablets in the morning and 1 tablet in the evening   Yes [provider]  cycloSPORINE (RESTASIS) 0.05 % ophthalmic emulsion Place 1 drop into both eyes 2 (two) times daily.   Yes [provider]  Emollient (BAG BALM EX) Apply 1 application topically daily.   Yes [provider]  ferrous sulfate 325 (65 FE) MG tablet Take 1 tablet (325 mg total) by mouth daily with breakfast. 10/11/17  Yes Reymundo Poll, MD  glucose blood (ACCU-CHEK AVIVA) test strip Use to check blood glucose up to three times a day. Dx code E 11.9 08/25/16  Yes Scherrie Gerlach, MD  [name] TAKE ONE TABLET BY MOUTH TWICE A DAY Patient taking differently: Take 1 tablet by mouth 2 (two) times daily. 04/07/20  Yes Christian, Rylee, MD  lisinopril (ZESTRIL) 10 MG tablet TAKE ONE TABLET BY MOUTH DAILY Patient taking differently: Take 10 mg by mouth daily. 04/08/19  Yes Anne Shutter, MD  Multiple Vitamin (MULTIVITAMIN) tablet Take 1 tablet by mouth daily.   Yes [provider]  Netarsudil-Latanoprost (ROCKLATAN) 0.02-0.005 % SOLN Place 1 drop into both eyes daily at 10 pm. 09/26/18  Yes [provider]  pantoprazole (PROTONIX) 20 MG tablet TAKE ONE TABLET BY MOUTH TWICE A DAY Patient taking differently: Take 20 mg by mouth 2 (two) times daily. 03/02/20  Yes Albertha Ghee, MD  Polyvinyl Alcohol-Povidone (REFRESH OP) Place 1 drop into both eyes daily as needed (dry eyes).   Yes [provider]  vitamin A 3 MG (10000 UNITS) capsule Take 10,000 Units by mouth daily.   Yes [provider]  vitamin B-12 (CYANOCOBALAMIN) 500 MCG tablet Take 500  mcg by mouth daily.   Yes [provider]  vitamin C (ASCORBIC ACID) 500 MG tablet Take 500 mg by mouth daily.   Yes [provider]  Zinc 50 MG CAPS Take 50 mg by mouth daily.    Yes [provider]  ACCU-CHEK SOFTCLIX LANCETS lancets Use to check blood glucose up to three times a day. Dx code E 11.9 08/25/16   Scherrie Gerlach, MD  pravastatin (PRAVACHOL) 20 MG tablet Take 1 tablet (20 mg total) by mouth daily. 12/23/11 02/11/12  Leodis Sias, MD     Objective    Physical Exam: Vitals:   05/14/20 1205 05/14/20 1220 05/14/20 1235 05/14/20 1250  BP: 115/72 133/65 122/67 107/66  Pulse: 66 65 63 65  Resp: (!) Temp:      TempSrc:      SpO2: 92% 100% 100% 96%  Weight:      Height:  General: appears to be stated age; alert, oriented Skin: warm, dry; right lower extremity dressing c/d/i Head:  AT/[place] Mouth:  Oral mucosa membranes appear dry, normal dentition Neck: supple; trachea midline Heart:  RRR; did not appreciate any M/R/G Lungs: CTAB, did not appreciate any wheezes, rales, or rhonchi Abdomen: + BS; soft, ND, NT Extremities: RLE dressing c/d/i, no muscle wasting Neuro: sensation intact in upper and lower extremities b/l; strength intact in the bilateral upper extremities;     Labs on Admission: I have personally reviewed following labs and imaging studies  CBC: Recent Labs  Lab 05/14/20 0606  WBC 8.2  HGB 10.9*  HCT 34.1*  MCV 89.0  PLT 208   Basic Metabolic Panel: Recent Labs  Lab 05/14/20 0606  NA 136  K 4.2  CL 103  CO2 25  GLUCOSE 113*  BUN 28*  CREATININE 1.44*  CALCIUM 9.6   GFR: Estimated Creatinine Clearance: 51.8 mL/min (A) (by C-G formula based on SCr of 1.44 mg/dL (H)). Liver Function Tests: No results for input(s): AST, ALT, ALKPHOS, BILITOT, PROT, ALBUMIN in the last 168 hours. No results for input(s): LIPASE, AMYLASE in the last 168 hours. No results for input(s): AMMONIA in the last 168  hours. Coagulation Profile: No results for input(s): INR, PROTIME in the last 168 hours. Cardiac Enzymes: No results for input(s): CKTOTAL, CKMB, CKMBINDEX, TROPONINI in the last 168 hours. BNP (last 3 results) No results for input(s): PROBNP in the last 8760 hours. HbA1C: No results for input(s): HGBA1C in the last 72 hours. CBG: Recent Labs  Lab 05/14/20 0544 05/14/20 1140  GLUCAP 135* 175*   Lipid Profile: No results for input(s): CHOL, HDL, LDLCALC, TRIG, CHOLHDL, LDLDIRECT in the last 72 hours. Thyroid Function Tests: No results for input(s): TSH, T4TOTAL, FREET4, T3FREE, THYROIDAB in the last 72 hours. Anemia Panel: No results for input(s): VITAMINB12, FOLATE, FERRITIN, TIBC, IRON, RETICCTPCT in the last 72 hours. Urine analysis:    Component Value Date/Time   COLORURINE YELLOW 08/30/2012 1418   APPEARANCEUR CLEAR 08/30/2012 1418   LABSPEC 1.011 08/30/2012 1418   PHURINE 5.5 08/30/2012 1418   GLUCOSEU NEG 08/30/2012 1418   HGBUR NEG 08/30/2012 1418   BILIRUBINUR NEG 08/30/2012 1418   KETONESUR NEG 08/30/2012 1418   PROTEINUR NEG 08/30/2012 1418   UROBILINOGEN 0.2 08/30/2012 1418   NITRITE NEG 08/30/2012 1418   LEUKOCYTESUR NEG 08/30/2012 1418    Radiological Exams on Admission: No results found.    Assessment/Plan   KERENSA NICKLAS is a 65 y.o. female with medical history significant for type 2 diabetes mellitus with most recent hemoglobin A1c 6.3% in February 2022 and complicated by right Charcot neuroarthropathy, stage IIIa chronic kidney disease with baseline creatinine 1.2-1.5, hyperlipidemia, hypertension, chronic normocytic anemia with baseline hemoglobin 9-11, who is admitted to [facility] on 05/14/2020 by the orthopedic surgery service for surgical treatment of right Charcot neuropathy. Postoperatively, [name] of the orthopedic surgery service, consulted the hospitalist service for assistance with postoperative management of the patient's  multiple chronic medical problems, including that of type 2 diabetes mellitus and stage IIIa chronic kidney disease.     Principal Problem:   Charcot's joint, right ankle and foot Active Problems:   Diabetes mellitus type II, controlled (HCC)   Essential hypertension   Normocytic anemia   GERD (gastroesophageal reflux disease)   CKD (chronic kidney disease) stage 3, GFR 30-59 ml/min (HCC)   Hyperlipidemia     #) Type 2 diabetes mellitus: Appears to  be well controlled as an outpatient, with most recent hemoglobin A1c noted to be 6.3% in February 2022.  Not on any insulin as an outpatient, but rather is on Janumet as her sole outpatient oral hypoglycemic agent.  Preoperative blood sugar by BMP noted to be 113, while postoperative point-of-care glucose found to be 175.  Plan: will hold home Janumet during this hospitalization, with plan to resume this medication upon discharge from the hospital.  Accu-Cheks before every meal and at bedtime with low-dose sliding scale insulin. Diabetic diet.       #) Stage IIIa chronic kidney disease: Associated with baseline creatinine of 1.2-1.5.  Preoperative labs performed earlier this morning were associated with hemoglobin within this baseline range.  Suspect the patient's chronic kidney disease is on the basis of diabetic nephropathy.  Of note, she is on lisinopril as an outpatient.  Plan: Monitor strict I's and O's and daily weights.  Attempt to avoid nephrotoxic agents.  Repeat BMP tomorrow morning.     #) Hyperlipidemia: On high intensity atorvastatin as an outpatient. Plan: Continue atorvastatin.      #) Essential hypertension: On lisinopril as her sole outpatient antihypertensive medication.  It appears that her blood pressures been running normotensive in the PACU. Will plan for close monitoring of post-operative BP's today via routine VS, with resumption of home Lisinopril tomorrow (05/15/20) if continued hemodynamic stability and  stable renal function demonstrated in the interval.   Plan: Close monitoring of postoperative blood pressure the routine vital signs.  Resumption of home lisinopril tomorrow (05/15/20), as above.  Repeat BMP tomorrow for close monitoring of ensuing renal function.      #) Chronic normocytic anemia: Given the history of such, with baseline hemoglobin noted to be 9-11.  Hemoglobin per this morning's preoperative labs found to be within this baseline range and continues to be associated with normocytic, normochromic findings as well as nonelevated RDW.  Of note, the patient has been on daily baby aspirin as an outpatient, but otherwise on no chronic blood thinning agents.  She is on daily oral iron supplementation at home.  Plan: Repeat CBC tomorrow morning.      #) GERD: On Protonix as an outpatient.  Plan: Resume home PPI.      #) right Charcot neuroarthropathy: associated with nonhealing ulcer of the right plantar midfoot and complicated by progressive collapse of right arch a/w  right subtalar dislocation and right talonavicular dislocation in spite of reported compliance with management per outpatient wound care center. In this context, the patient was admitted by the orthopedic surgery team to [facility] for surgical treatment of the above. Subsequently underwent multiple surgical procedures performed by [name] of orthopedic surgery on the morning of 05/14/20, including the following, which were performed under general anesthesia: Open treatment of right subtalar dislocation, open treatment of right talonavicular dislocation with right ankle and right subtalar arthrodesis, and arthrodesis of right transverse tarsal joints with corrective ostomy. Patient extubated in the OR and recovered in the PACU without any reported intra-operative complications. Orthopedic surgery service to remain primary. Weightbearing status per orthopedic surgery, which is currently noted to nonweightbeariing on RLE over  the next months. DVT prophylaxis per orthopedic surgery service. Post-operatively, RLE appear neurovascularly intact, and patient currently reports adequate pain control.   Plan: Pain control per orthopedic surgery service, which currently includes as needed oxycodone.  Physical therapy consult placed by orthopedic surgery service.  Nonweightbearing on RLE over next 3 months per ortho.  Repeat CBC tomorrow  morning.  Incentive spirometry ordered.  As needed Zofran and as needed Robaxin per orthopedic surgery team.     DVT prophylaxis: Lovenox 40 mg subcutaneous daily, per orthopedic surgery team. Code Status: Full code Family Communication: none Disposition Plan: Per Rounding Team Admission status: Inpatient; MedSurg     Of note, this patient was added by me to the following Admit List/Treatment Team: [email or web address].      PLEASE NOTE THAT DRAGON DICTATION SOFTWARE WAS USED IN THE CONSTRUCTION OF THIS NOTE.   Angie Fava DO [facility] Pager [phone] From 12PM - 8PM  Otherwise, please contact night-coverage  [email or web address] Password [facility]   05/14/2020, 1:06 PM

## 2020-05-14 NOTE — Anesthesia Postprocedure Evaluation (Signed)
Anesthesia Post Note  Patient: [name]  Procedure(s) Performed: Right heelcord lengthening, subtalar and talonavicular arthrodesis, talus exostectomy, talus corrective osteotomy (Right Foot)     Patient location during evaluation: PACU Anesthesia Type: General Level of consciousness: awake and alert and oriented Pain management: pain level controlled Vital Signs Assessment: post-procedure vital signs reviewed and stable Respiratory status: spontaneous breathing, nonlabored ventilation and respiratory function stable Cardiovascular status: blood pressure returned to baseline Postop Assessment: no apparent nausea or vomiting Anesthetic complications: no   No complications documented.  Last Vitals:  Vitals:   05/14/20 1135 05/14/20 1150  BP: 127/83 132/66  Pulse: 70 62  Resp: 10 13  Temp: 36.5 C   SpO2: 100% 98%    Last Pain:  Vitals:   05/14/20 1135  TempSrc:   PainSc: 0-No pain                 Kaylyn Layer

## 2020-05-14 NOTE — Anesthesia Procedure Notes (Signed)
Anesthesia Regional Block: Adductor canal block   Pre-Anesthetic Checklist: ,, timeout performed, Correct Patient, Correct Site, Correct Laterality, Correct Procedure, Correct Position, site marked, Risks and benefits discussed, pre-op evaluation,  At surgeon's request and post-op pain management  Laterality: Right  Prep: Maximum Sterile Barrier Precautions used, chloraprep       Needles:  Injection technique: Single-shot  Needle Type: Echogenic Stimulator Needle     Needle Length: 9cm  Needle Gauge: 22     Additional Needles:   Procedures:,,,, ultrasound used (permanent image in chart),,,,  Narrative:  Start time: 05/14/2020 7:17 AM End time: 05/14/2020 7:20 AM Injection made incrementally with aspirations every 5 mL.  Performed by: Personally  Anesthesiologist: Kaylyn Layer, MD  Additional Notes: Risks, benefits, and alternative discussed. Patient gave consent for procedure. Patient prepped and draped in sterile fashion. Sedation administered, patient remains easily responsive to voice. Relevant anatomy identified with ultrasound guidance. Local anesthetic given in 5cc increments with no signs or symptoms of intravascular injection. No pain or paraesthesias with injection. Patient monitored throughout procedure with signs of LAST or immediate complications. Tolerated well. Ultrasound image placed in chart.  Sarah Greenhouse, MD

## 2020-05-14 NOTE — Plan of Care (Signed)

## 2020-05-14 NOTE — Plan of Care (Signed)

## 2020-05-14 NOTE — Discharge Instructions (Addendum)
Toni Arthurs, MD [facility]  Please read the following information regarding your care after surgery.  Medications  You only need a prescription for the narcotic pain medicine (ex. oxycodone, Percocet, Norco).  All of the other medicines listed below are available over the counter. X Aleve 2 pills twice a day for the first 3 days after surgery. X acetominophen (Tylenol) 650 mg every 4-6 hours as you need for minor to moderate pain X oxycodone as prescribed for severe pain  Narcotic pain medicine (ex. oxycodone, Percocet, Vicodin) will cause constipation.  To prevent this problem, take the following medicines while you are taking any pain medicine. X docusate sodium (Colace) 100 mg twice a day X senna (Senokot) 2 tablets twice a day  X To help prevent blood clots, take Xarelto as prescribed for 2 weeks after surgery.  You should also get up every hour while you are awake to move around.    Weight Bearing X Do not bear any weight on the operated leg or foot.  Cast / Splint / Dressing X Keep your splint, cast or dressing clean and dry.  Don't put anything (coat hanger, pencil, etc) down inside of it.  If it gets damp, use a hair dryer on the cool setting to dry it.  If it gets soaked, call the office to schedule an appointment for a cast change.   After your dressing, cast or splint is removed; you may shower, but do not soak or scrub the wound.  Allow the water to run over it, and then gently pat it dry.  Swelling It is normal for you to have swelling where you had surgery.  To reduce swelling and pain, keep your toes above your nose for at least 3 days after surgery.  It may be necessary to keep your foot or leg elevated for several weeks.  If it hurts, it should be elevated.  Follow Up Call my office at [phone] when you are discharged from the hospital or surgery center to schedule an appointment to be seen two weeks after surgery.  Call my office at [phone] if you develop a  fever >101.5 F, nausea, vomiting, bleeding from the surgical site or severe pain.

## 2020-05-14 NOTE — H&P (Signed)
[name] is an 65 y.o. female.   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  She c/o a nonhealing ulcer of the right plantar midfoot and progressive collapse of the right arch.  She has failed extensive treatment by the wound center and presents today for excision of the ulcer and arthrodesis of the TN and ST joints.  She may also need osteotomy of the calcaneus.  Past Medical History:  Diagnosis Date  . Arthritis   . Chronic normocytic anemia   . CKD (chronic kidney disease) stage 3, GFR 30-59 ml/min (HCC) 02/02/2013  . Diabetes mellitus type II, controlled (HCC) 06/17/2009  . Diabetic ulcer of both feet (HCC) 01/07/2010   Qualifier: Diagnosis of  By: [name] MD, Amanjot    . Essential hypertension 06/17/2009  . GERD (gastroesophageal reflux disease)   . Glaucoma   . H/O hiatal hernia   . Hyperlipidemia   . MRSA (methicillin resistant Staphylococcus aureus)   . Obstructive sleep apnea 05/26/2010    Past Surgical History:  Procedure Laterality Date  . ACHILLES TENDON LENGTHENING Left 11/06/2014   Procedure: LEFT ACHILLES TENDON LENGTHENING, ;  Surgeon: Toni Arthurs, MD;  Location: [facility] OR;  Service: Orthopedics;  Laterality: Left;  . APPENDECTOMY  1975  . FOOT ARTHRODESIS Left 11/06/2014   Procedure: TALONAVICULAR CANCELLOUS CUBOID ARTHRODESIS WITH CORRECTIVE OSTEOTOMY ;  Surgeon: Toni Arthurs, MD;  Location: [facility] OR;  Service: Orthopedics;  Laterality: Left;  . HARDWARE REMOVAL Left 02/19/2015   Procedure: REMOVAL OF LEFT FOOT EXTERNAL FIXATOR, CASTING OF LEFT ANKLE ;  Surgeon: Toni Arthurs, MD;  Location: [facility];  Service: Orthopedics;  Laterality: Left;  . I & D EXTREMITY Left 2011   "ulcer on my foot got infected" (01/30/2013)  . MULTIPLE TOOTH EXTRACTIONS Bilateral   . OOPHORECTOMY Bilateral 1975-1976    Family History  Problem Relation Age of Onset  . Diabetes Mother   . Hypertension  Mother   . Dementia Mother   . Heart disease Father   . Stroke Father   . Diabetes Father   . Hypertension Father   . Gout Father   . Deep vein thrombosis Father   . Breast cancer Cousin   . Breast cancer Cousin    Social History:  reports that she quit smoking about 47 years ago. Her smoking use included cigarettes. She smoked 0.50 packs per day for 0.00 years. She has never used smokeless tobacco. She reports that she does not drink alcohol and does not use drugs.  Allergies:  Allergies  Allergen Reactions  . Codeine Itching and Swelling    swelling in throat  . Pravastatin Other (See Comments)    cramps  . Latex Rash    Medications Prior to Admission  Medication Sig Dispense Refill  . acetaminophen (TYLENOL) 500 MG tablet Take 500-1,000 mg by mouth every 6 (six) hours as needed for moderate pain or headache.    Marland Kitchen aspirin EC 81 MG tablet Take 1 tablet (81 mg total) by mouth daily. 90 tablet 3  . atorvastatin (LIPITOR) 40 MG tablet TAKE ONE TABLET BY MOUTH DAILY (Patient taking differently: Take 40 mg by mouth daily.) 90 tablet 3  . Biotin 1000 MCG tablet Take 1,000 mcg by mouth daily.    . brimonidine-timolol (COMBIGAN) 0.2-0.5 % ophthalmic solution Place 1 drop into both eyes every 12 (twelve) hours. Place one drop to both eyes twice daily for glaucoma (  Patient taking differently: Place 1 drop into both eyes every 12 (twelve) hours.) 15 mL 0  . calcium carbonate (TUMS - DOSED IN MG ELEMENTAL CALCIUM) 500 MG chewable tablet Chew 500-1,000 mg by mouth daily as needed for indigestion or heartburn.    . Calcium Carbonate-Vitamin D (CALCIUM 600+D PO) Take 1 tablet by mouth daily.    . Cholecalciferol (VITAMIN D) 50 MCG (2000 UT) tablet Take 2,000 Units by mouth daily.    . COLLAGEN PO Take 1-2 tablets by mouth See admin instructions. Take 2 tablets in the morning and 1 tablet in the evening    . cycloSPORINE (RESTASIS) 0.05 % ophthalmic emulsion Place 1 drop into both eyes 2 (two)  times daily.    . Emollient (BAG BALM EX) Apply 1 application topically daily.    . ferrous sulfate 325 (65 FE) MG tablet Take 1 tablet (325 mg total) by mouth daily with breakfast. 90 tablet 1  . glucose blood (ACCU-CHEK AVIVA) test strip Use to check blood glucose up to three times a day. Dx code E 11.9 100 each 12  . [name] TAKE ONE TABLET BY MOUTH TWICE A DAY (Patient taking differently: Take 1 tablet by mouth 2 (two) times daily.) 180 tablet 0  . lisinopril (ZESTRIL) 10 MG tablet TAKE ONE TABLET BY MOUTH DAILY (Patient taking differently: Take 10 mg by mouth daily.) 90 tablet 1  . Multiple Vitamin (MULTIVITAMIN) tablet Take 1 tablet by mouth daily.    . Netarsudil-Latanoprost (ROCKLATAN) 0.02-0.005 % SOLN Place 1 drop into both eyes daily at 10 pm.    . pantoprazole (PROTONIX) 20 MG tablet TAKE ONE TABLET BY MOUTH TWICE A DAY (Patient taking differently: Take 20 mg by mouth 2 (two) times daily.) 180 tablet 2  . Polyvinyl Alcohol-Povidone (REFRESH OP) Place 1 drop into both eyes daily as needed (dry eyes).    . vitamin A 3 MG (10000 UNITS) capsule Take 10,000 Units by mouth daily.    . vitamin B-12 (CYANOCOBALAMIN) 500 MCG tablet Take 500 mcg by mouth daily.    . vitamin C (ASCORBIC ACID) 500 MG tablet Take 500 mg by mouth daily.    . Zinc 50 MG CAPS Take 50 mg by mouth daily.     Marland Kitchen ACCU-CHEK SOFTCLIX LANCETS lancets Use to check blood glucose up to three times a day. Dx code E 11.9 100 each 12    Results for orders placed or performed during the hospital encounter of 05/14/20 (from the past 48 hour(s))  Glucose, capillary     Status: Abnormal   Collection Time: 05/14/20  5:44 AM  Result Value Ref Range   Glucose-Capillary 135 (H) 70 - 99 mg/dL    Comment: Glucose reference range applies only to samples taken after fasting for at least 8 hours.  CBC per protocol     Status: Abnormal   Collection Time: 05/14/20  6:06 AM  Result Value Ref Range   WBC 8.2 4.0 - 10.5 K/uL    RBC 3.83 (L) 3.87 - 5.11 MIL/uL   Hemoglobin 10.9 (L) 12.0 - 15.0 g/dL   HCT 67.6 (L) 19.5 - 09.3 %   MCV 89.0 80.0 - 100.0 fL   MCH 28.5 26.0 - 34.0 pg   MCHC 32.0 30.0 - 36.0 g/dL   RDW 26.7 12.4 - 58.0 %   Platelets 208 150 - 400 K/uL   nRBC 0.0 0.0 - 0.2 %    Comment: Performed at [facility]  Lab, [address] [place], [place] [address]   No results found.  Review of Systems  No recent f/c/nv/wt/ loss.  Blood pressure 129/64, pulse 73, temperature 98.1 F (36.7 C), temperature source Oral, resp. rate 17, height 5\' 8"  (1.727 m), weight 112 kg, SpO2 100 %. Physical Exam  wn wd female in nad.  A and O x 4.  Normal mood and affect.  EOMI.  resp unlabored.  R foot with plantar medial ulcer beneath the talar head.  No signs of infection.  Skin o/w intact.  Heelcord is tight.  The arch is collapsed with severe hindfoot valgus.  Brisk cap refill at the toes.  Assessment/Plan R foot diabetic ulcer and stage 3 flatfoot deformity.  To the OR today for surgical treatment.  The risks and benefits of the alternative treatment options have been discussed in detail.  The patient wishes to proceed with surgery and specifically understands risks of bleeding, infection, nerve damage, blood clots, need for additional surgery, amputation and death.   Toni Arthurs, MD 05/28/20, 7:16 AM

## 2020-05-14 NOTE — Anesthesia Procedure Notes (Signed)
Anesthesia Regional Block: Popliteal block   Pre-Anesthetic Checklist: ,, timeout performed, Correct Patient, Correct Site, Correct Laterality, Correct Procedure, Correct Position, site marked, Risks and benefits discussed, pre-op evaluation,  At surgeon's request and post-op pain management  Laterality: Right  Prep: Maximum Sterile Barrier Precautions used, chloraprep       Needles:  Injection technique: Single-shot  Needle Type: Echogenic Stimulator Needle     Needle Length: 9cm  Needle Gauge: 22     Additional Needles:   Procedures:,,,, ultrasound used (permanent image in chart),,,,  Narrative:  Start time: 05/14/2020 7:20 AM End time: 05/14/2020 7:23 AM Injection made incrementally with aspirations every 5 mL.  Performed by: Personally  Anesthesiologist: Kaylyn Layer, MD  Additional Notes: Risks, benefits, and alternative discussed. Patient gave consent for procedure. Patient prepped and draped in sterile fashion. Sedation administered, patient remains easily responsive to voice. Relevant anatomy identified with ultrasound guidance. Local anesthetic given in 5cc increments with no signs or symptoms of intravascular injection. No pain or paraesthesias with injection. Patient monitored throughout procedure with signs of LAST or immediate complications. Tolerated well. Ultrasound image placed in chart.  Sarah Greenhouse, MD

## 2020-05-14 NOTE — Anesthesia Procedure Notes (Signed)
Procedure Name: LMA Insertion Date/Time: 05/14/2020 7:49 AM Performed by: Lonia Mad, CRNA Pre-anesthesia Checklist: Patient identified, Emergency Drugs available, Suction available and Patient being monitored Patient Re-evaluated:Patient Re-evaluated prior to induction Oxygen Delivery Method: Circle System Utilized Preoxygenation: Pre-oxygenation with 100% oxygen Induction Type: IV induction Ventilation: Mask ventilation without difficulty LMA: LMA inserted LMA Size: 4.0 Number of attempts: 1 Airway Equipment and Method: Bite block Placement Confirmation: positive ETCO2 Tube secured with: Tape Dental Injury: Teeth and Oropharynx as per pre-operative assessment

## 2020-05-14 NOTE — Transfer of Care (Signed)
Immediate Anesthesia Transfer of Care Note  Patient: [name]  Procedure(s) Performed: Right heelcord lengthening, subtalar and talonavicular arthrodesis, talus exostectomy, talus corrective osteotomy (Right Foot)  Patient Location: PACU  Anesthesia Type:General and Regional  Level of Consciousness: awake, alert , oriented, patient cooperative and responds to stimulation  Airway & Oxygen Therapy: Patient Spontanous Breathing and Patient connected to nasal cannula oxygen  Post-op Assessment: Report given to RN and Post -op Vital signs reviewed and stable  Post vital signs: Reviewed and stable  Last Vitals:  Vitals Value Taken Time  BP 127/83 05/14/20 1135  Temp    Pulse 70 05/14/20 1137  Resp 10 05/14/20 1137  SpO2 100 % 05/14/20 1137  Vitals shown include unvalidated device data.  Last Pain:  Vitals:   05/14/20 0713  TempSrc:   PainSc: 5       Patients Stated Pain Goal: 2 (05/14/20 0713)  Complications: No complications documented.

## 2020-05-14 NOTE — Op Note (Signed)
05/14/2020  11:32 AM  PATIENT:  [name]  65 y.o. female  PRE-OPERATIVE DIAGNOSIS: 1.  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  Right foot plantar medial diabetic ulcer 3.  Right subtalar dislocation 4.  Right talonavicular dislocation   POST-OPERATIVE DIAGNOSIS: 1.  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  Right foot plantar medial diabetic ulcer 3.  Right subtalar dislocation 4.  Right talonavicular dislocation 5.  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  Short right Achilles tendon  Procedure(s): 1.  Open treatment of right subtalar dislocation 2.  Open treatment of right talonavicular dislocation 3.  Right peroneus brevis and peroneus longus tendon lengthening 4.  Percutaneous right Achilles tendon lengthening 5.  Right ankle arthrodesis 6.  Right subtalar arthrodesis 7.  Arthrodesis of the right transverse tarsal joints with corrective osteotomy 8.  AP and lateral radiographs of the right ankle 9.  AP and lateral radiographs of the right foot  SURGEON:  Toni Arthurs, MD  ASSISTANT: [name], PA-C  ANESTHESIA:   General, regional  EBL:  350 mm Hg  TOURNIQUET:   Total Tourniquet Time Documented: Thigh (Right) - 120 minutes Total: Thigh (Right) - 120 minutes  COMPLICATIONS:  None apparent  DISPOSITION:  Extubated, awake and stable to recovery.  INDICATION FOR PROCEDURE: The patient is a 65 year old female with a past medical history significant for diabetes complicated by bilateral lower extremity Charcot neuroarthropathy.  She has a nonhealing ulcer at the right plantar medial midfoot.  She has Charcot changes involving her right ankle, subtalar and transverse tarsal joints with dislocation of the subtalar and talonavicular joints.  She has failed nonoperative  treatment to date including activity modification, bracing and custom orthotics and extensive treatment at the wound center.  Her hemoglobin A1c is less than 7.  She presents now for operative treatment of this severe Charcot deformity.  The risks and benefits of the alternative treatment options have been discussed in detail.  The patient wishes to proceed with surgery and specifically understands risks of bleeding, infection, nerve damage, blood clots, need for additional surgery, amputation and death.  PROCEDURE IN DETAIL: After preoperative consent was obtained and the correct operative site was identified, the patient was brought the operating room and placed supine on the operating table.  General anesthesia was induced.  Preoperative antibiotics were administered.  Surgical timeout was taken.  The right lower extremity was prepped and draped in standard sterile fashion with a tourniquet around the thigh.  The extremity was elevated and the tourniquet was inflated to 350 mmHg.  A curvilinear incision was made over the posterior medial ankle in line with the posterior tibial tendon.  An ellipsoid incision was then carried around the plantar medial ulcer beneath the head of the talus.  The ulcer was excised in its entirety.  The underlying base of the wound was sharply excised as well.  The posterior tibial tendon was identified.  The talonavicular joint was dislocated with the head of the talus plantar to the posterior tibial tendon and navicular.  Subperiosteal dissection was then carried deep to the head of the talus and navicular creating a full-thickness flap.  The talonavicular joint was mobilized but could not be reduced at this point.  Attention was then turned to the lateral aspect of the hindfoot where an incision was made along the course  of the peroneal tendons.  Dissection was carried sharply down through the subcutaneous tissues.  The peroneals were identified.  The distal fibula was noted to be  fractured with nonunion.  Nonunited fragments were sharply excised distally.  The distal portion of the fibula was significantly malunited.  The talus was noted to be significantly rotated laterally.  The subtalar joint was dislocated with the calcaneus lateral to the talus and abutting the distal fibula.  Fibrous tissue was removed from the subtalar joint allowing mobilization of the calcaneus.  The peroneals were noted to be contracted and prevented reduction of the subtalar joint.  They were both lengthened in Z fashion.  The subtalar joint could then be reduced after clearing all fibrous tissue from the joint.  The Achilles tendon was noted to be severely contracted and prevented appropriate reduction of the subtalar joint.  It was lengthened in percutaneous fashion.  The transverse tarsal joints was then cleared of all soft tissue.  Subperiosteal dissection was carried dorsal and plantar to the midfoot.  The head of the talus was noted to block reduction of the midfoot relative to the hindfoot.  An osteotomy was made at the head of the talus allowing the talonavicular joint to be reduced.  The ankle was noted to be unstable due to the malalignment at the fibula.  The decision was made to include the ankle in the reconstruction with tibial talocalcaneal arthrodesis.  The fibula was cut obliquely just proximal to the ankle.  It was passed off the back table as bone graft.  The remaining articular cartilage and subchondral bone was removed from both sides of the ankle joint.  The ankle was then reduced and provisionally pinned.  The subtalar joint could now be reduced.  It was provisionally pinned to the talus from the calcaneus.  A longitudinal incision was then made in the plantar aspect of the heel.  A guidepin was inserted for a [employer] nail.  It was advanced across the posterior facet of the subtalar joint and into the distal tibia across the ankle joint.  AP and lateral radiographs confirmed  appropriate position of the guidepin.  The guidepin was overdrilled and exchanged for a ball-tipped guidewire.  The ball-tipped guidewire was advanced up into the medullary canal.  It was then overreamed to a diameter of 12 mm.  An 11 mm [place] nail was then inserted and seated flush to the plantar aspect of the heel.  2 proximal interlock screws were inserted percutaneously and were noted to have excellent purchase.  The guide was then used to insert a screw through the talus from medial to lateral.  The compression screw was tightened compressing the ankle joint.  The exterior compression sleeve was then tightened compressing the subtalar joint.  A medial to lateral screw was placed in the calcaneus followed by a posterior to anterior screw both using the targeting guide.  The targeting guide was removed.  AP and lateral radiographs showed appropriate arthrodesis of the ankle and subtalar joints.  The talonavicular and calcaneocuboid joints were then reduced after preparing them appropriately.  The forefoot and midfoot were reduced relative to the hindfoot.  The talonavicular and calcaneocuboid joints were provisionally pinned.  Guidepins were then used to insert 5 mm partially-threaded cannulated screws compressing the joints appropriately.  The wounds were irrigated copiously before all arthrodesis.  The joints were all packed with cancellous autograft from the bone mill mixed with vancomycin powder.  Final AP and lateral ankle films  and AP and lateral foot films showed appropriate arthrodesis of the transverse tarsal joints in appropriate position and alignment of all hardware.  The ankle and subtalar joints were also noted to be appropriately reduced with appropriate position and length of all hardware.  The nail components were locked.  The wounds were again irrigated copiously and sprinkled with vancomycin powder.  Deep subcutaneous tissues were approximated with 0 PDS.  Skin incisions were closed with  simple and horizontal mattress sutures of 2-0 nylon and staples.  Sterile dressings were applied followed by well-padded short leg splint.  The tourniquet had been released at 2 hours.  Hemostasis was achieved prior to closure.  A gram of Ancef was administered intravenously at the 3-hour surgical mark.  The patient was awakened from anesthesia and transported to the recovery room in stable condition.   FOLLOW UP PLAN: Nonweightbearing on the right lower extremity.  Lovenox for DVT prophylaxis while an inpatient followed by aspirin as an outpatient.  The patient will be nonweightbearing for 3 months and then go into a [employer]	[place] for a year.   RADIOGRAPHS: AP and lateral radiographs of the right ankle are obtained intraoperatively.  These show interval arthrodesis of the ankle and subtalar joints.  Hardware is appropriately positioned and of the appropriate lengths.  Appropriate reduction of the subtalar joint is also noted.  AP and lateral radiographs of the right foot are obtained intraoperatively.  These show interval reduction of the subtalar joint and talonavicular joint and arthrodesis of the transverse tarsal joints and the subtalar joint.  Hardware is appropriately positioned and of the appropriate lengths.    [name] PA-C was present and scrubbed for the duration of the operative case. His assistance was essential in positioning the patient, prepping and draping, gaining and maintaining exposure, performing the operation, closing and dressing the wounds and applying the splint.

## 2020-05-15 ENCOUNTER — Encounter (HOSPITAL_COMMUNITY): Payer: Self-pay | Admitting: Orthopedic Surgery

## 2020-05-15 LAB — MAGNESIUM: Magnesium: 1.5 mg/dL — ABNORMAL LOW (ref 1.7–2.4)

## 2020-05-15 LAB — CBC
HCT: 24.4 % — ABNORMAL LOW (ref 36.0–46.0)
HCT: 22.5 % — ABNORMAL LOW (ref 36.0–46.0)
Hemoglobin: 8.2 g/dL — ABNORMAL LOW (ref 12.0–15.0)
Hemoglobin: 7.2 g/dL — ABNORMAL LOW (ref 12.0–15.0)
MCH: 29.4 pg (ref 26.0–34.0)
MCH: 28.9 pg (ref 26.0–34.0)
MCHC: 33.6 g/dL (ref 30.0–36.0)
MCHC: 32 g/dL (ref 30.0–36.0)
MCV: 87.5 fL (ref 80.0–100.0)
MCV: 90.4 fL (ref 80.0–100.0)
Platelets: 165 10*3/uL (ref 150–400)
Platelets: 147 10*3/uL — ABNORMAL LOW (ref 150–400)
RBC: 2.79 MIL/uL — ABNORMAL LOW (ref 3.87–5.11)
RBC: 2.49 MIL/uL — ABNORMAL LOW (ref 3.87–5.11)
RDW: 15.1 % (ref 11.5–15.5)
RDW: 15.4 % (ref 11.5–15.5)
WBC: 10.7 10*3/uL — ABNORMAL HIGH (ref 4.0–10.5)
WBC: 9.7 10*3/uL (ref 4.0–10.5)
nRBC: 0 % (ref 0.0–0.2)
nRBC: 0 % (ref 0.0–0.2)

## 2020-05-15 LAB — BASIC METABOLIC PANEL
Anion gap: 7 (ref 5–15)
BUN: 24 mg/dL — ABNORMAL HIGH (ref 8–23)
CO2: 25 mmol/L (ref 22–32)
Calcium: 8.3 mg/dL — ABNORMAL LOW (ref 8.9–10.3)
Chloride: 103 mmol/L (ref 98–111)
Creatinine, Ser: 1.38 mg/dL — ABNORMAL HIGH (ref 0.44–1.00)
GFR, Estimated: 43 mL/min — ABNORMAL LOW (ref >60)
Glucose, Bld: 130 mg/dL — ABNORMAL HIGH (ref 70–99)
Potassium: 4.1 mmol/L (ref 3.5–5.1)
Sodium: 135 mmol/L (ref 135–145)

## 2020-05-15 LAB — GLUCOSE, CAPILLARY
Glucose-Capillary: 172 mg/dL — ABNORMAL HIGH (ref 70–99)
Glucose-Capillary: 162 mg/dL — ABNORMAL HIGH (ref 70–99)
Glucose-Capillary: 124 mg/dL — ABNORMAL HIGH (ref 70–99)
Glucose-Capillary: 161 mg/dL — ABNORMAL HIGH (ref 70–99)

## 2020-05-15 MED ORDER — DOCUSATE SODIUM 100 MG PO CAPS: 100.0000 mg | ORAL_CAPSULE | Freq: Two times a day (BID) | ORAL | 0 refills | Status: DC

## 2020-05-15 MED ORDER — OXYCODONE HCL 5 MG PO TABS: 5.0000 mg | ORAL_TABLET | ORAL | 0 refills | Status: DC | PRN

## 2020-05-15 MED ORDER — SENNA 8.6 MG PO TABS: 2.0000 | ORAL_TABLET | Freq: Two times a day (BID) | ORAL | 0 refills | Status: DC

## 2020-05-15 MED ORDER — SODIUM CHLORIDE 0.9 % IV BOLUS
500.0000 mL | Freq: Once | INTRAVENOUS | Status: AC
  Administered 2020-05-15: 500 mL via INTRAVENOUS

## 2020-05-15 MED ORDER — RIVAROXABAN 10 MG PO TABS: 10.0000 mg | ORAL_TABLET | Freq: Every day | ORAL | 0 refills | Status: DC

## 2020-05-15 MED ORDER — MAGNESIUM SULFATE 2 GM/50ML IV SOLN
2.0000 g | Freq: Once | INTRAVENOUS | Status: AC
  Administered 2020-05-15: 2 g via INTRAVENOUS
  Filled 2020-05-15: qty 50

## 2020-05-15 NOTE — TOC Initial Note (Signed)
Transition of Care [facility]) - Initial/Assessment Note    Patient Details  Name: [name] MRN: [id number] Date of Birth: [birth year]  Transition of Care [facility]) CM/SW Contact:    Epifanio Lesches, RN Phone Number: 05/15/2020, 4:23 PM  Clinical Narrative:    Admitted with R foot wound. From boarding house. States independent with ADL's PTA. - s/p  Right heelcord lengthening, subtalar and talonavicular arthrodesis, talus exostectomy, talus corrective osteotomy,4/28            NCM received consult for possible SNF placement at time of discharge. NCM spoke with patient regarding PT recommendation of SNF placement at time of discharge. Patient reported that she  is currently unable to care for self independently  given her current physical needs and fall risk. Patient expressed understanding of PT recommendation and is agreeable to SNF placement at time of discharge. Patient reports preference for [facility] or [facility]. NCM discussed insurance authorization process and provided Medicare SNF ratings list. Patient expressed being hopeful for rehab and to feel better soon. No further questions reported at this time. RNCM to continue to follow and assist with discharge planning needs.     Pt is COVID VACCINATED / booster  Expected Discharge Plan: Skilled Nursing Facility Barriers to Discharge: Continued Medical Work up   Patient Goals and CMS Choice Patient states their goals for this hospitalization and ongoing recovery are:: get better and go home CMS Medicare.gov Compare Post Acute Care list provided to:: Patient    Expected Discharge Plan and Services Expected Discharge Plan: Skilled Nursing Facility                                              Prior Living Arrangements/Services                       Activities of Daily Living Home Assistive Devices/Equipment: Crutches ADL Screening (condition at time of admission) Patient's cognitive ability adequate  to safely complete daily activities?: Yes Is the patient deaf or have difficulty hearing?: No Does the patient have difficulty seeing, even when wearing glasses/contacts?: No Does the patient have difficulty concentrating, remembering, or making decisions?: No Patient able to express need for assistance with ADLs?: Yes Does the patient have difficulty dressing or bathing?: No Independently performs ADLs?: Yes (appropriate for developmental age) Does the patient have difficulty walking or climbing stairs?: Yes Weakness of Legs: Right Weakness of Arms/Hands: None  Permission Sought/Granted                  Emotional Assessment              Admission diagnosis:  Charcot's joint, right ankle and foot [M14.671] Patient Active Problem List   Diagnosis Date Noted  . Charcot's joint, right ankle and foot 05/14/2020  . Pre-op evaluation 12/17/2019  . Osteomyelitis of left foot (HCC) 03/02/2019  . Diabetic ulcer of both feet (HCC) 08/08/2018  . Right ankle swelling 02/08/2018  . Obesity 05/18/2017  . Hyperlipidemia 11/12/2014  . Charcot foot due to diabetes mellitus (HCC) 11/06/2014  . CKD (chronic kidney disease) stage 3, GFR 30-59 ml/min (HCC) 02/02/2013  . GERD (gastroesophageal reflux disease) 03/09/2012  . Screening mammogram, encounter for 05/26/2010  . Obstructive sleep apnea 05/26/2010  . Normocytic anemia 01/12/2010  . Diabetes mellitus type II, controlled (HCC) 06/17/2009  .  Essential hypertension 06/17/2009   PCP:  Chevis Pretty, MD Pharmacy:   [name] [address] [place] - [address] [place] [address] Phone: [phone] Fax: [phone]     Social Determinants of Health (SDOH) Interventions    Readmission Risk Interventions No flowsheet data found.

## 2020-05-15 NOTE — Progress Notes (Signed)
Occupational Therapy Evaluation Patient Details Name: [name] MRN: [id number] DOB: 1955/06/13 Today's Date: 05/15/2020    History of Present Illness HPI: [name] is a 65 y.o. female admitted 4/28 following excision of the ulcer and arthrodesis of the TN and ST joints complicated by charco neuroarthropathy. pt with medical history significant for type 2 diabetes mellitus.   Clinical Impression   Pt was evaluated for the above diagnoses/treatment, pt was on [facility] upon arrival with RN present and reported 5/10 pain but willing to participate in therapy. Pt reported being indep with all ADLs prior to admission, does not work, does drive and watches her grandson sometimes. Pt was mod A for sit<>stand transfer with good ability to maintain R NWB. However pt observed not maintaining R NWB during stand pivot transfer given max vc. Pt is currently mod for most ADLs, total A for perineal hygiene in standing. Pt would benefit from use of slide board or steady when transferring from chair>bed to adhere to R NWB. Pt benefits from OT services acutely to progress indep in ADLs and mobility. Recommend SNF level of care post d/c to progress function of ADLs and provide safe d/c into home environment.     Follow Up Recommendations  SNF;Supervision/Assistance - 24 hour    Equipment Recommendations  None recommended by OT       Precautions / Restrictions Precautions Precautions: Fall Required Braces or Orthoses: Splint/Cast Restrictions Weight Bearing Restrictions: Yes RLE Weight Bearing: Non weight bearing      Mobility Bed Mobility Overal bed mobility: Needs Assistance Bed Mobility: Supine to Sit     Supine to sit: Min guard     General bed mobility comments: pt in chiar upon arrival    Transfers Overall transfer level: Needs assistance Equipment used: Rolling walker (2 wheeled) Transfers: Sit to/from [employer] Sit to Stand: Mod assist Stand pivot  transfers: Min assist (max verbal cues for RLE NWB status)      Lateral/Scoot Transfers: Min assist General transfer comment: mod A for boosting sit>stand, min A for balance in staning. Pt did not adhere to NWB status during stand-pivot trasnfer given max verbal cues.    Balance Overall balance assessment: Needs assistance Sitting-balance support: No upper extremity supported Sitting balance-Leahy Scale: Fair     Standing balance support: Bilateral upper extremity supported Standing balance-Leahy Scale: Poor                             ADL either performed or assessed with clinical judgement   ADL Overall ADL's : Needs assistance/impaired Eating/Feeding: Independent;Sitting   Grooming: Wash/dry hands;Wash/dry face;Applying deodorant;Oral care;Brushing hair;Set up;Sitting   Upper Body Bathing: Set up;Sitting   Lower Body Bathing: Minimal assistance;Sitting/lateral leans;Cueing for compensatory techniques   Upper Body Dressing : Set up;Sitting   Lower Body Dressing: Moderate assistance;Sit to/from stand;Cueing for safety;Cueing for compensatory techniques   Toilet Transfer: Moderate assistance;BSC;RW;Stand-pivot   Toileting- Clothing Manipulation and Hygiene: Total assistance;Sit to/from stand       Functional mobility during ADLs: Moderate assistance;Rolling walker;Cueing for safety;Cueing for sequencing General ADL Comments: Pt requried max verbal cues to maintain NWB status, and was observed not adhering to status during functional ambulation and sit<>stand for hygiene after toileting.                  Pertinent Vitals/Pain Pain Assessment: 0-10 Pain Score: 5  Pain Location: R ankle/leg Pain Descriptors / Indicators: Discomfort;Grimacing Pain  Intervention(s): Limited activity within patient's tolerance;Monitored during session     Hand Dominance Right   Extremity/Trunk Assessment Upper Extremity Assessment Upper Extremity Assessment: Overall WFL  for tasks assessed   Lower Extremity Assessment Lower Extremity Assessment: Defer to PT evaluation RLE Deficits / Details: numb, MD aware   Cervical / Trunk Assessment Cervical / Trunk Assessment: Normal   Communication Communication Communication: No difficulties   Cognition Arousal/Alertness: Awake/alert Behavior During Therapy: WFL for tasks assessed/performed Overall Cognitive Status: Within Functional Limits for tasks assessed             General Comments  Pt with splint and ace wrap on RLE, IV placed in LUE; no other skin integrity issues noted.            Home Living Family/patient expects to be discharged to:: Private residence Living Arrangements: Non-relatives/Friends Available Help at Discharge: Other (Comment) (Pt lives with others however they are unable to assist her) Type of Home: House Home Access: Stairs to enter [employer] of Steps: 4   Home Layout: One level     Bathroom Shower/Tub: Chief Strategy Officer: Standard Bathroom Accessibility: No   Home Equipment: Crutches;Tub bench          Prior Functioning/Environment Level of Independence: Independent with assistive device(s)        Comments: used crutches prior to surgery        OT Problem List: Decreased strength;Decreased range of motion;Decreased activity tolerance;Impaired balance (sitting and/or standing);Decreased safety awareness;Decreased knowledge of use of DME or AE;Decreased knowledge of precautions;Pain      OT Treatment/Interventions: Self-care/ADL training;Therapeutic exercise;DME and/or AE instruction;Modalities;Therapeutic activities;Patient/family education;Balance training    OT Goals(Current goals can be found in the care plan section) Acute Rehab OT Goals Patient Stated Goal: To go get rehab OT Goal Formulation: With patient Time For Goal Achievement: 05/29/20 Potential to Achieve Goals: Fair ADL Goals Pt Will Perform Lower Body Bathing:  with min guard assist;sit to/from stand Pt Will Perform Lower Body Dressing: with min guard assist;sit to/from stand Pt Will Transfer to Toilet: with min guard assist;stand pivot transfer;bedside commode Pt Will Perform Toileting - Clothing Manipulation and hygiene: with min guard assist;sit to/from stand  OT Frequency: Min 2X/week    AM-PAC OT "6 Clicks" Daily Activity     Outcome Measure Help from another person eating meals?: None Help from another person taking care of personal grooming?: A Little Help from another person toileting, which includes using toliet, bedpan, or urinal?: A Lot Help from another person bathing (including washing, rinsing, drying)?: A Lot Help from another person to put on and taking off regular upper body clothing?: A Little Help from another person to put on and taking off regular lower body clothing?: A Lot 6 Click Score: 16   End of Session Equipment Utilized During Treatment: Gait belt;Rolling walker Nurse Communication: Mobility status;Need for lift equipment;Weight bearing status (recommend using steady with tactile cue for RLE NWB to get pt back to bed)  Activity Tolerance: Patient tolerated treatment well Patient left: in bed;with chair alarm set;with call bell/phone within reach;with nursing/sitter in room  OT Visit Diagnosis: Unsteadiness on feet (R26.81);Other abnormalities of gait and mobility (R26.89);Pain Pain - Right/Left: Right Pain - part of body: Ankle and joints of foot                Time: 2536-6440 OT Time Calculation (min): 24 min Charges:  OT General Charges $OT Visit: 1 Visit OT Evaluation $OT Eval Moderate  Complexity: 1 Mod OT Treatments $Self Care/Home Management : 8-22 mins   Ammara Raj A Rabiah Goeser 05/15/2020, 1:54 PM

## 2020-05-15 NOTE — Evaluation (Addendum)
Physical Therapy Evaluation Patient Details Name: [name] MRN: [id number] DOB: 09/23/1955 Today's Date: 05/15/2020   History of Present Illness  HPI: [name] is a 65 y.o. female admitted 4/28 following excision of the ulcer and arthrodesis of the TN and ST joints complicated by charcot neuroarthropathy. pt with medical history significant for type 2 diabetes mellitus.  Clinical Impression  Pt fully participated in evaluation. Pt was I prior to admission. Pt lives at a boarding house and has roommates, but they are unable to assist in care. The house is also not w/c accessible. Pt requiring increased assist for sit<>stand and unable to maintain NWB on RLE. Performed sliding board transfer to recliner with min A with assist to maintain R LE elevated and NWB. Pt will benefit from skilled PT to address deficits in strength, balance, coordination, safety and endurance to maximize independence with functional mobility prior to discharge.    Follow Up Recommendations SNF;Supervision/Assist for mobility/OOB    Equipment Recommendations  Other (comment) (defer to next venue)    Recommendations for Other Services       Precautions / Restrictions Precautions Precautions: Fall Required Braces or Orthoses: Splint/Cast Restrictions Weight Bearing Restrictions: Yes RLE Weight Bearing: Non weight bearing      Mobility  Bed Mobility Overal bed mobility: Needs Assistance Bed Mobility: Supine to Sit     Supine to sit: Min guard          Transfers Overall transfer level: Needs assistance Equipment used: Rolling walker (2 wheeled);Sliding board Transfers: Sit to/from Stand;Lateral/Scoot Transfers Sit to Stand: Mod assist        Lateral/Scoot Transfers: Min assist General transfer comment: mod A to stand with RW wtih inability to maintain NWB on RLE. transitioned to use of SB wtih therapist assisting to keep R LE elevated  Ambulation/Gait             General  Gait Details: unable  Stairs            Wheelchair Mobility    Modified Rankin (Stroke Patients Only)       Balance Overall balance assessment: Needs assistance Sitting-balance support: No upper extremity supported (1 foot supported) Sitting balance-Leahy Scale: Fair     Standing balance support: Bilateral upper extremity supported Standing balance-Leahy Scale: Poor                               Pertinent Vitals/Pain      Home Living Family/patient expects to be discharged to:: Private residence (boarding house) Living Arrangements: Non-relatives/Friends   Type of Home: House Home Access: Stairs to enter Entrance Stairs-Rails:  (there is a railing but not safe) Secretary/administrator of Steps: 4 Home Layout: One level Home Equipment: Crutches      Prior Function Level of Independence: Independent with assistive device(s)         Comments: used crutches prior to surgery     Hand Dominance   Dominant Hand: Right    Extremity/Trunk Assessment        Lower Extremity Assessment Lower Extremity Assessment: Generalized weakness;RLE deficits/detail RLE Deficits / Details: numb, MD aware       Communication   Communication: No difficulties  Cognition Arousal/Alertness: Awake/alert Behavior During Therapy: WFL for tasks assessed/performed Overall Cognitive Status: Within Functional Limits for tasks assessed  General Comments General comments (skin integrity, edema, etc.): pt with difficulty maintaining NWB on RLE, max cueing with pt requiring assist to maintain    Exercises     Assessment/Plan    PT Assessment Patient needs continued PT services  PT Problem List Decreased mobility;Decreased strength;Decreased safety awareness;Decreased coordination;Decreased activity tolerance;Decreased balance;Decreased knowledge of use of DME       PT Treatment Interventions DME  instruction;Therapeutic exercise;Gait training;Balance training;Functional mobility training;Therapeutic activities;Patient/family education    PT Goals (Current goals can be found in the Care Plan section)  Acute Rehab PT Goals Patient Stated Goal: To go get rehab PT Goal Formulation: With patient Time For Goal Achievement: 05/28/20 Potential to Achieve Goals: Fair    Frequency Min 3X/week   Barriers to discharge        Co-evaluation               AM-PAC PT "6 Clicks" Mobility  Outcome Measure Help needed turning from your back to your side while in a flat bed without using bedrails?: None Help needed moving from lying on your back to sitting on the side of a flat bed without using bedrails?: A Little Help needed moving to and from a bed to a chair (including a wheelchair)?: A Little Help needed standing up from a chair using your arms (e.g., wheelchair or bedside chair)?: A Lot Help needed to walk in hospital room?: Total Help needed climbing 3-5 steps with a railing? : Total 6 Click Score: 14    End of Session Equipment Utilized During Treatment: Gait belt Activity Tolerance: Patient tolerated treatment well Patient left: in chair;with chair alarm set;with bed alarm set Nurse Communication: Mobility status PT Visit Diagnosis: Unsteadiness on feet (R26.81);Other abnormalities of gait and mobility (R26.89);Muscle weakness (generalized) (M62.81)    Time: 7673-4193 PT Time Calculation (min) (ACUTE ONLY): 32 min   Charges:   PT Evaluation $PT Eval Low Complexity: 1 Low PT Treatments $Therapeutic Activity: 8-22 mins        [name], DPT Acute Rehabilitation Services [phone]  [name] 05/15/2020, 10:28 AM

## 2020-05-15 NOTE — Plan of Care (Signed)
Pt's BP this morning was soft, rechecked at 12:07 86/56. Justin-PA notified, received and carried out new orders. Pt is alert and feels weak. RN will continue to monitor.  Problem: Health Behavior/Discharge Planning: Goal: Ability to manage health-related needs will improve Outcome: Progressing   Problem: Clinical Measurements: Goal: Ability to maintain clinical measurements within normal limits will improve Outcome: Progressing   Problem: Activity: Goal: Risk for activity intolerance will decrease Outcome: Progressing   Problem: Nutrition: Goal: Adequate nutrition will be maintained Outcome: Progressing   Problem: Coping: Goal: Level of anxiety will decrease Outcome: Progressing   Problem: Elimination: Goal: Will not experience complications related to bowel motility Outcome: Progressing   Problem: Pain Managment: Goal: General experience of comfort will improve Outcome: Progressing   Problem: Safety: Goal: Ability to remain free from injury will improve Outcome: Progressing   Problem: Skin Integrity: Goal: Risk for impaired skin integrity will decrease Outcome: Progressing

## 2020-05-15 NOTE — Progress Notes (Signed)
Subjective: 1 Day Post-Op Procedure(s) (LRB): Right heelcord lengthening, subtalar and talonavicular arthrodesis, talus exostectomy, talus corrective osteotomy (Right)  Patient reports pain as mild to moderate.  Tolerating POs well.  Denies fever, chills, N/V, CP.  Resting comfortably in bed this am with CPAP.  Notes that her foot is numb.  Objective:   VITALS:  Temp:  [97 F (36.1 C)-98.4 F (36.9 C)] 98.3 F (36.8 C) (04/29 0430) Pulse Rate:  [62-98] 82 (04/29 0430) Resp:  [8-18] 18 (04/29 0430) BP: (101-133)/(52-83) 101/52 (04/29 0430) SpO2:  [92 %-100 %] 100 % (04/29 0430)  General: WDWN patient in NAD. Psych:  Appropriate mood and affect. Neuro:  A&O x 3, Moving all extremities, sensation diminished to light touch HEENT:  EOMs intact Chest:  Even non-labored respirations Skin: SLS C/D/I, no rashes or lesions Extremities: warm/dry, no visible edema, erythema or echymosis.  No lymphadenopathy. Pulses: Popliteus 2+ MSK:  ROM: TKE, MMT: able to perform quad set    LABS Recent Labs    05/14/20 0606 05/15/20 0011  HGB 10.9* 8.2*  WBC 8.2 10.7*  PLT 208 165   Recent Labs    05/14/20 0606 05/15/20 0011  NA 136 135  K 4.2 4.1  CL 103 103  CO2 25 25  BUN 28* 24*  CREATININE 1.44* 1.38*  GLUCOSE 113* 130*   No results for input(s): LABPT, INR in the last 72 hours.   Assessment/Plan: 1 Day Post-Op Procedure(s) (LRB): Right heelcord lengthening, subtalar and talonavicular arthrodesis, talus exostectomy, talus corrective osteotomy (Right)  NWB R LE Up with therapy DVT ppx:  lovenox in house; transition to Xarelto upon D/C Disp: Pending, likely SNF D/C scripts on chart. After searching [place] PMP Aware the patient is provided a Rx for oxycodone. Plan for 2 week outpatient post-op visit. Appreciate Medicine team's assistance with this patient.  [name] PA-C [facility] Office:  [phone]

## 2020-05-15 NOTE — Progress Notes (Signed)
RT placed patient on CPAP HS . Patient tolerating well at this time. 

## 2020-05-15 NOTE — Consult Note (Signed)
PROGRESS NOTE    [name]  WVP:[id number] DOB: 04/14/1955 DOA: 05/14/2020 PCP: Chevis Pretty, MD    Brief Narrative:  Reason for Consult: Assistance with postoperative management of multiple chronic medical problems, including that of type 2 diabetes mellitus and stage IIIa chronic kidney disease.   HPI: [name] is a 65 y.o. female with medical history significant for type 2 diabetes mellitus with most recent hemoglobin A1c 6.3% in February 2022 and complicated by right Charcot neuroarthropathy, stage IIIa chronic kidney disease with baseline creatinine 1.2-1.5, hyperlipidemia, hypertension, chronic normocytic anemia with baseline hemoglobin 9-11, who is admitted to [facility] on 05/14/2020 by the orthopedic surgery service for surgical treatment of right Charcot neuropathy.    4/30- this am pain controlled. FS controlled. Later in the afternoon, bp dropped sbp 80's. bp med held. Given 500 ml bolus with improvement of bp.    Subjective: No complaints of sob, cp, abd pain  Objective: Vitals:   05/15/20 0430 05/15/20 0912 05/15/20 1207 05/15/20 1331  BP: (!) 101/52 (!) 92/43 (!) 86/56 115/64  Pulse: 82 93 88 85  Resp: 18 17 17    Temp: 98.3 F (36.8 C) 99.1 F (37.3 C) 99.3 F (37.4 C)   TempSrc: Oral Oral Oral   SpO2: 100% 97% 100%   Weight:      Height:        Intake/Output Summary (Last 24 hours) at 05/15/2020 1338 Last data filed at 05/15/2020 1328 Gross per 24 hour  Intake 2224.97 ml  Output --  Net 2224.97 ml   Filed Weights   05/14/20 0546 05/14/20 0713  Weight: 112 kg 112 kg    Examination:  General exam: Appears calm and comfortable  Respiratory system: Clear to auscultation. Respiratory effort normal. Cardiovascular system: S1 & S2 heard, RRR. No JVD, murmurs, rubs, gallops or clicks. Gastrointestinal system: Abdomen is nondistended, soft and nontender.. Normal bowel sounds heard. Central nervous system: Alert and oriented.  No focal neurological deficits. Extremities: RLE wrapped. lle no edema Psychiatry: Judgement and insight appear normal. Mood & affect appropriate.     Data Reviewed: I have personally reviewed following labs and imaging studies  CBC: Recent Labs  Lab 05/14/20 0606 05/15/20 0011  WBC 8.2 10.7*  HGB 10.9* 8.2*  HCT 34.1* 24.4*  MCV 89.0 87.5  PLT 208 165   Basic Metabolic Panel: Recent Labs  Lab 05/14/20 0606 05/15/20 0011  NA 136 135  K 4.2 4.1  CL 103 103  CO2 25 25  GLUCOSE 113* 130*  BUN 28* 24*  CREATININE 1.44* 1.38*  CALCIUM 9.6 8.3*  MG  --  1.5*   GFR: Estimated Creatinine Clearance: 54 mL/min (A) (by C-G formula based on SCr of 1.38 mg/dL (H)). Liver Function Tests: No results for input(s): AST, ALT, ALKPHOS, BILITOT, PROT, ALBUMIN in the last 168 hours. No results for input(s): LIPASE, AMYLASE in the last 168 hours. No results for input(s): AMMONIA in the last 168 hours. Coagulation Profile: No results for input(s): INR, PROTIME in the last 168 hours. Cardiac Enzymes: No results for input(s): CKTOTAL, CKMB, CKMBINDEX, TROPONINI in the last 168 hours. BNP (last 3 results) No results for input(s): PROBNP in the last 8760 hours. HbA1C: Recent Labs    05/14/20 0606  HGBA1C 6.5*   CBG: Recent Labs  Lab 05/14/20 1140 05/14/20 1552 05/14/20 2043 05/15/20 0635 05/15/20 1143  GLUCAP 175* 146* 154* 172* 162*   Lipid Profile: No results for input(s): CHOL, HDL, LDLCALC, TRIG,  CHOLHDL, LDLDIRECT in the last 72 hours. Thyroid Function Tests: No results for input(s): TSH, T4TOTAL, FREET4, T3FREE, THYROIDAB in the last 72 hours. Anemia Panel: No results for input(s): VITAMINB12, FOLATE, FERRITIN, TIBC, IRON, RETICCTPCT in the last 72 hours. Sepsis Labs: No results for input(s): PROCALCITON, LATICACIDVEN in the last 168 hours.  Recent Results (from the past 240 hour(s))  SARS CORONAVIRUS 2 (TAT 6-24 HRS) Nasopharyngeal Nasopharyngeal Swab     Status:  None   Collection Time: 05/12/20 11:17 AM   Specimen: Nasopharyngeal Swab  Result Value Ref Range Status   SARS Coronavirus 2 NEGATIVE NEGATIVE Final    Comment: (NOTE) SARS-CoV-2 target nucleic acids are NOT DETECTED.  The SARS-CoV-2 RNA is generally detectable in upper and lower respiratory specimens during the acute phase of infection. Negative results do not preclude SARS-CoV-2 infection, do not rule out co-infections with other pathogens, and should not be used as the sole basis for treatment or other patient management decisions. Negative results must be combined with clinical observations, patient history, and epidemiological information. The expected result is Negative.  Fact Sheet for Patients: HairSlick.no  Fact Sheet for Healthcare Providers: quierodirigir.com  This test is not yet approved or cleared by the [place] FDA and  has been authorized for detection and/or diagnosis of SARS-CoV-2 by FDA under an Emergency Use Authorization (EUA). This EUA will remain  in effect (meaning this test can be used) for the duration of the COVID-19 declaration under Se ction 564(b)(1) of the Act, 21 U.S.C. section 360bbb-3(b)(1), unless the authorization is terminated or revoked sooner.  Performed at [facility], [address], [place], [place] [address]   Surgical PCR screen     Status: Abnormal   Collection Time: 05/14/20  7:38 AM   Specimen: Nasal Mucosa; Nasal Swab  Result Value Ref Range Status   MRSA, PCR NEGATIVE NEGATIVE Final   Staphylococcus aureus POSITIVE (A) NEGATIVE Final    Comment: (NOTE) The Xpert SA Assay (FDA approved for NASAL specimens in patients 69 years of age and older), is one component of a comprehensive surveillance program. It is not intended to diagnose infection nor to guide or monitor treatment. Performed at [facility], [address] [place], [place] [address]           Radiology Studies: No results found.      Scheduled Meds: . atorvastatin  40 mg Oral Daily  . brimonidine  1 drop Both Eyes Q12H   And  . timolol  1 drop Both Eyes BID  . cholecalciferol  2,000 Units Oral Daily  . cycloSPORINE  1 drop Both Eyes BID  . docusate sodium  100 mg Oral BID  . enoxaparin (LOVENOX) injection  40 mg Subcutaneous Q24H  . ferrous sulfate  325 mg Oral Q breakfast  . insulin aspart  0-9 Units Subcutaneous TID WC  . mupirocin ointment  1 application Nasal BID  . Netarsudil-Latanoprost  1 drop Both Eyes Q2200  . pantoprazole  20 mg Oral BID  . senna  1 tablet Oral BID  . vitamin B-12  500 mcg Oral Daily   Continuous Infusions: . sodium chloride 75 mL/hr at 05/15/20 1329  . methocarbamol (ROBAXIN) IV      Assessment & Plan:   Principal Problem:   Charcot's joint, right ankle and foot Active Problems:   Diabetes mellitus type II, controlled (HCC)   Essential hypertension   Normocytic anemia   GERD (gastroesophageal reflux disease)   CKD (  chronic kidney disease) stage 3, GFR 30-59 ml/min (HCC)   Hyperlipidemia   #) Type 2 diabetes mellitus: Appears to be well controlled as an outpatient,  hemoglobin A1c noted to be 6.3% in February 2022.   Not on any insulin as an outpatient, but rather is on Janumet as her sole outpatient oral hypoglycemic agent.  4/28-BG levels stable here Continue riss Carb control diet         #) Stage IIIa chronic kidney disease: Associated with baseline creatinine of 1.2-1.5.   Renal function stable.  Avoid nephrotoxic meds Continue to monitor   #Hypomagnesimia Will replace with iv mg and monitor level    #) Hyperlipidemia: continue statin    #) Essential hypertension:hypotensive now. Responded to fluid. Dc bp meds Monitor.  Repeat cbc    #) Chronic normocytic anemia: Given the history of such, with baseline hemoglobin noted to be 9-11.   Overall stable.  Will  monitor.   #) GERD: on ppi       #) right Charcot neuroarthropathy: mx per primary team          LOS: 1 day   Time spent: 35 min .    Lynn Ito, MD [facility] Pager [phone]  If 7PM-7AM, please contact night-coverage 05/15/2020, 1:38 PM

## 2020-05-15 NOTE — Progress Notes (Signed)
OT Cancellation Note  Patient Details Name: [name] MRN: [id number] DOB: 01-Jan-1956   Cancelled Treatment:    Reason Eval/Treat Not Completed: Pain limiting ability to participate;Medical issues which prohibited therapy (Pt with orthostatic BP, complaining of 10/10 shooting pain and unable to participate with therapy at this time. Spoke wtih RN, will try eval after lunch and medication.)  Lelon Mast A [name] 05/15/2020, 11:15 AM

## 2020-05-15 NOTE — Plan of Care (Signed)
  Problem: Clinical Measurements: Goal: Ability to maintain clinical measurements within normal limits will improve Outcome: Progressing Goal: Will remain free from infection Outcome: Progressing   

## 2020-05-16 LAB — GLUCOSE, CAPILLARY
Glucose-Capillary: 126 mg/dL — ABNORMAL HIGH (ref 70–99)
Glucose-Capillary: 130 mg/dL — ABNORMAL HIGH (ref 70–99)
Glucose-Capillary: 125 mg/dL — ABNORMAL HIGH (ref 70–99)
Glucose-Capillary: 134 mg/dL — ABNORMAL HIGH (ref 70–99)

## 2020-05-16 NOTE — Plan of Care (Signed)

## 2020-05-16 NOTE — Progress Notes (Signed)
Physical Therapy Treatment Patient Details Name: [name] MRN: [id number] DOB: [birth year] Today's Date: 05/16/2020    History of Present Illness HPI: [name] is a 65 y.o. female admitted 4/28 following excision of the ulcer and arthrodesis of the TN and ST joints complicated by charco neuroarthropathy. pt with medical history significant for type 2 diabetes mellitus.    PT Comments    Pt is making slow but steady progress towards goals. She required less assist with transfers today and showed improved ability to maintain NWB. Pt transferred from bed>BSC>recliner chair via SPT. Unable to safely ambulate and maintain NWB at this time. Will continue to follow acutely for mobility progression.     Follow Up Recommendations  SNF;Supervision for mobility/OOB     Equipment Recommendations  Other (comment) (defer to next venue)    Recommendations for Other Services       Precautions / Restrictions Precautions Precautions: Fall Required Braces or Orthoses: Splint/Cast Restrictions Weight Bearing Restrictions: Yes RLE Weight Bearing: Non weight bearing    Mobility  Bed Mobility Overal bed mobility: Needs Assistance Bed Mobility: Supine to Sit     Supine to sit: Supervision     General bed mobility comments: HOB elevated and use of bed rails. No assist or cues required    Transfers Overall transfer level: Needs assistance Equipment used: Rolling walker (2 wheeled) Transfers: Sit to/from [employer] Sit to Stand: Min assist;From elevated surface Stand pivot transfers: Min assist       General transfer comment: Pt able to rise from elevated EOB and BSC with min A. Cues for hand placement and NWB. Pt mostly able to maintain NWB during stand pivot transfer 2x. Occasionally resting heel on ground but did not appear to actually place weight through it.  Ambulation/Gait             General Gait Details: unable   Stairs              Wheelchair Mobility    Modified Rankin (Stroke Patients Only)       Balance Overall balance assessment: Needs assistance Sitting-balance support: No upper extremity supported Sitting balance-Leahy Scale: Fair     Standing balance support: Bilateral upper extremity supported Standing balance-Leahy Scale: Poor                              Cognition Arousal/Alertness: Awake/alert Behavior During Therapy: WFL for tasks assessed/performed Overall Cognitive Status: Within Functional Limits for tasks assessed                                 General Comments: very pleasent      Exercises      General Comments        Pertinent Vitals/Pain Pain Assessment: 0-10 Pain Score: 2  Pain Location: R ankle/leg, worse in dependent position Pain Descriptors / Indicators: Discomfort;Grimacing Pain Intervention(s): Monitored during session;Limited activity within patient's tolerance;Repositioned    Home Living                      Prior Function            PT Goals (current goals can now be found in the care plan section) Acute Rehab PT Goals Patient Stated Goal: To go get rehab PT Goal Formulation: With patient Time For Goal Achievement: 05/28/20 Potential to Achieve Goals:  Fair Progress towards PT goals: Progressing toward goals    Frequency    Min 3X/week      PT Plan Current plan remains appropriate    Co-evaluation              AM-PAC PT "6 Clicks" Mobility   Outcome Measure  Help needed turning from your back to your side while in a flat bed without using bedrails?: None Help needed moving from lying on your back to sitting on the side of a flat bed without using bedrails?: A Little Help needed moving to and from a bed to a chair (including a wheelchair)?: A Little Help needed standing up from a chair using your arms (e.g., wheelchair or bedside chair)?: A Little Help needed to walk in hospital room?: Total Help  needed climbing 3-5 steps with a railing? : Total 6 Click Score: 15    End of Session Equipment Utilized During Treatment: Gait belt Activity Tolerance: Patient tolerated treatment well Patient left: in chair;with call bell/phone within reach Nurse Communication: Mobility status PT Visit Diagnosis: Unsteadiness on feet (R26.81);Other abnormalities of gait and mobility (R26.89);Muscle weakness (generalized) (M62.81)     Time: 8242-3536 PT Time Calculation (min) (ACUTE ONLY): 24 min  Charges:  $Therapeutic Activity: 23-37 mins                     Kallie Locks, [place] Pager [phone] Acute Rehab   Sheral Apley 05/16/2020, 2:09 PM

## 2020-05-16 NOTE — Progress Notes (Signed)
Subjective: 2 Days Post-Op Procedure(s) (LRB): Right heelcord lengthening, subtalar and talonavicular arthrodesis, talus exostectomy, talus corrective osteotomy (Right) Patient reports pain as mild.  No c/o this morning. No BM yet but + flatus  Objective: Vital signs in last 24 hours: Temp:  [98.8 F (37.1 C)-100.2 F (37.9 C)] 99.8 F (37.7 C) (04/30 0822) Pulse Rate:  [85-98] 98 (04/30 0822) Resp:  [16-17] 16 (04/30 0434) BP: (86-133)/(51-67) 120/58 (04/30 0822) SpO2:  [99 %-100 %] 99 % (04/30 0822)  Intake/Output from previous day: 04/29 0701 - 04/30 0700 In: 3114.8 [I.V.:3088.1; IV Piggyback:26.7] Out: -  Intake/Output this shift: No intake/output data recorded.  Recent Labs    05/14/20 0606 05/15/20 0011 05/15/20 1412  HGB 10.9* 8.2* 7.2*   Recent Labs    05/15/20 0011 05/15/20 1412  WBC 10.7* 9.7  RBC 2.79* 2.49*  HCT 24.4* 22.5*  PLT 165 147*   Recent Labs    05/14/20 0606 05/15/20 0011  NA 136 135  K 4.2 4.1  CL 103 103  CO2 25 25  BUN 28* 24*  CREATININE 1.44* 1.38*  GLUCOSE 113* 130*  CALCIUM 9.6 8.3*   No results for input(s): LABPT, INR in the last 72 hours.  Neurologically intact ABD soft Neurovascular intact Sensation intact distally Intact pulses distally Dorsiflexion/Plantar flexion intact Incision: dressing C/D/I and no drainage No cellulitis present Compartment soft no sign of DVT   Assessment/Plan: 2 Days Post-Op Procedure(s) (LRB): Right heelcord lengthening, subtalar and talonavicular arthrodesis, talus exostectomy, talus corrective osteotomy (Right) Advance diet Up with therapy D/C IV fluids Awaiting SNF for D/C  [name] 05/16/2020, 11:00 AM

## 2020-05-16 NOTE — Consult Note (Addendum)
[facility] Medical Consultation  [name] ZOX:[id number] DOB: [birth year] DOA: 05/14/2020 PCP: Chevis Pretty, MD   Reason for Consult:Assistance with postoperative management of multiple chronic medicalproblems, including that oftype 2 diabetes mellitus andstage IIIa chronic kidney disease.  WJX:[id number] [name] a 65 y.o.femalewith medical history significant fortype 2 diabetes mellitus with most recent hemoglobin A1c 6.3% in February 2022 and complicated by right Charcotneuroarthropathy,stage IIIa chronic kidney disease with baseline creatinine 1.2-1.5, hyperlipidemia, hypertension, chronic normocytic anemia with baseline hemoglobin 9-11, who is admitted to [facility] on4/28/2022by the orthopedic surgery serviceforsurgical treatment of right Charcot neuropathy.  4/30- no pain this am. Able to move leg some. Has no sob, cp, dizziness  Impression/Recommendations Principal Problem:   Charcot's joint, right ankle and foot Active Problems:   Diabetes mellitus type II, controlled (HCC)   Essential hypertension   Normocytic anemia   GERD (gastroesophageal reflux disease)   CKD (chronic kidney disease) stage 3, GFR 30-59 ml/min (HCC)   Hyperlipidemia    #) Type 2 diabetes mellitus: Appears to be well controlled as an outpatient,  hemoglobin A1c noted to be 6.3% in February 2022.  Not on any insulin as an outpatient,but rather is on Janumet as her sole outpatient oral hypoglycemic agent.  4/30-BG levels stable  Continue carb controlled diet  Continue ISS     #) Stage IIIa chronic kidney disease: Associated with baseline creatinine of 1.2-1.5. Renal function stable  Continue to monitor periodically  Avoid nephrotoxic meds       #Hypomagnesimia Was replaced. Will monitor    #) Hyperlipidemia: continue statin    #) Essential hypertension:hypotensive now. Responded to fluid. 4/30-continue to hold bp  meds   #) Chronic normocytic anemia: Given the history of such, with baseline hemoglobin noted to be 9-11. Hg 7.2 this am. Likely dilutional and post op.  If Hg <7 should transfuse. Continue fes04.  #) GERD: on ppi   #)rightCharcot neuroarthropathy:mx per primary team  Past Medical History:  Diagnosis Date  . Arthritis   . Chronic normocytic anemia   . CKD (chronic kidney disease) stage 3, GFR 30-59 ml/min (HCC) 02/02/2013  . Diabetes mellitus type II, controlled (HCC) 06/17/2009  . Diabetic ulcer of both feet (HCC) 01/07/2010   Qualifier: Diagnosis of  By: [name] MD, Amanjot    . Essential hypertension 06/17/2009  . GERD (gastroesophageal reflux disease)   . Glaucoma   . H/O hiatal hernia   . Hyperlipidemia   . MRSA (methicillin resistant Staphylococcus aureus)   . Obstructive sleep apnea 05/26/2010   Past Surgical History:  Procedure Laterality Date  . ACHILLES TENDON LENGTHENING Left 11/06/2014   Procedure: LEFT ACHILLES TENDON LENGTHENING, ;  Surgeon: Toni Arthurs, MD;  Location: [facility] OR;  Service: Orthopedics;  Laterality: Left;  . APPENDECTOMY  1975  . FOOT ARTHRODESIS Left 11/06/2014   Procedure: TALONAVICULAR CANCELLOUS CUBOID ARTHRODESIS WITH CORRECTIVE OSTEOTOMY ;  Surgeon: Toni Arthurs, MD;  Location: [facility] OR;  Service: Orthopedics;  Laterality: Left;  . FOOT ARTHRODESIS Right 05/14/2020   Procedure: Right heelcord lengthening, subtalar and talonavicular arthrodesis, talus exostectomy, talus corrective osteotomy;  Surgeon: Toni Arthurs, MD;  Location: [facility] OR;  Service: Orthopedics;  Laterality: Right;  . HARDWARE REMOVAL Left 02/19/2015   Procedure: REMOVAL OF LEFT FOOT EXTERNAL FIXATOR, CASTING OF LEFT ANKLE ;  Surgeon: Toni Arthurs, MD;  Location: [facility];  Service: Orthopedics;  Laterality: Left;  . I & D EXTREMITY Left 2011   "ulcer on my foot got infected" (  01/30/2013)  . MULTIPLE TOOTH EXTRACTIONS Bilateral   . OOPHORECTOMY Bilateral 1975-1976    Social History:  reports that she quit smoking about 47 years ago. Her smoking use included cigarettes. She smoked 0.50 packs per day for 0.00 years. She has never used smokeless tobacco. She reports that she does not drink alcohol and does not use drugs.   Family History  Problem Relation Age of Onset  . Diabetes Mother   . Hypertension Mother   . Dementia Mother   . Heart disease Father   . Stroke Father   . Diabetes Father   . Hypertension Father   . Gout Father   . Deep vein thrombosis Father   . Breast cancer Cousin   . Breast cancer Cousin     Allergies  Allergen Reactions  . Codeine Itching and Swelling    swelling in throat  . Pravastatin Other (See Comments)    cramps  . Latex Rash   Prior to Admission medications   Medication Sig Start Date End Date Taking? Authorizing Provider  acetaminophen (TYLENOL) 500 MG tablet Take 500-1,000 mg by mouth every 6 (six) hours as needed for moderate pain or headache.   Yes [provider]  aspirin EC 81 MG tablet Take 1 tablet (81 mg total) by mouth daily. 10/11/17  Yes Reymundo Poll, MD  atorvastatin (LIPITOR) 40 MG tablet TAKE ONE TABLET BY MOUTH DAILY Patient taking differently: Take 40 mg by mouth daily. 12/30/19  Yes Christian, Rylee, MD  Biotin 1000 MCG tablet Take 1,000 mcg by mouth daily.   Yes [provider]  brimonidine-timolol (COMBIGAN) 0.2-0.5 % ophthalmic solution Place 1 drop into both eyes every 12 (twelve) hours. Place one drop to both eyes twice daily for glaucoma Patient taking differently: Place 1 drop into both eyes every 12 (twelve) hours. 08/25/16  Yes Scherrie Gerlach, MD  calcium carbonate (TUMS - DOSED IN MG ELEMENTAL CALCIUM) 500 MG chewable tablet Chew 500-1,000 mg by mouth daily as needed for indigestion or heartburn.   Yes [provider]  Calcium Carbonate-Vitamin D (CALCIUM 600+D PO) Take 1 tablet by mouth daily.   Yes [provider]  Cholecalciferol (VITAMIN D)  50 MCG (2000 UT) tablet Take 2,000 Units by mouth daily.   Yes [provider]  COLLAGEN PO Take 1-2 tablets by mouth See admin instructions. Take 2 tablets in the morning and 1 tablet in the evening   Yes [provider]  cycloSPORINE (RESTASIS) 0.05 % ophthalmic emulsion Place 1 drop into both eyes 2 (two) times daily.   Yes [provider]  docusate sodium (COLACE) 100 MG capsule Take 1 capsule (100 mg total) by mouth 2 (two) times daily. While taking narcotic pain medicine. 05/15/20  Yes Ollis, Florentina Addison, PA-C  Emollient (BAG BALM EX) Apply 1 application topically daily.   Yes [provider]  ferrous sulfate 325 (65 FE) MG tablet Take 1 tablet (325 mg total) by mouth daily with breakfast. 10/11/17  Yes Reymundo Poll, MD  glucose blood (ACCU-CHEK AVIVA) test strip Use to check blood glucose up to three times a day. Dx code E 11.9 08/25/16  Yes Scherrie Gerlach, MD  [name] TAKE ONE TABLET BY MOUTH TWICE A DAY Patient taking differently: Take 1 tablet by mouth 2 (two) times daily. 04/07/20  Yes Christian, Rylee, MD  lisinopril (ZESTRIL) 10 MG tablet TAKE ONE TABLET BY MOUTH DAILY Patient taking differently: Take 10 mg by mouth daily. 04/08/19  Yes Anne Shutter, MD  Multiple Vitamin (MULTIVITAMIN) tablet Take 1 tablet by mouth daily.   Yes [provider]  Netarsudil-Latanoprost (ROCKLATAN) 0.02-0.005 % SOLN Place 1 drop into both eyes daily at 10 pm. 09/26/18  Yes [provider]  oxyCODONE (ROXICODONE) 5 MG immediate release tablet Take 1 tablet (5 mg total) by mouth every 4 (four) hours as needed for moderate pain or severe pain. 05/15/20  Yes Jacinta Shoe, PA-C  pantoprazole (PROTONIX) 20 MG tablet TAKE ONE TABLET BY MOUTH TWICE A DAY Patient taking differently: Take 20 mg by mouth 2 (two) times daily. 03/02/20  Yes Albertha Ghee, MD  Polyvinyl Alcohol-Povidone (REFRESH OP) Place 1 drop into both eyes daily as needed  (dry eyes).   Yes [provider]  rivaroxaban (XARELTO) 10 MG TABS tablet Take 1 tablet (10 mg total) by mouth daily. 05/15/20  Yes Jacinta Shoe, PA-C  senna (SENOKOT) 8.6 MG TABS tablet Take 2 tablets (17.2 mg total) by mouth 2 (two) times daily. 05/15/20  Yes Jacinta Shoe, PA-C  vitamin A 3 MG (10000 UNITS) capsule Take 10,000 Units by mouth daily.   Yes [provider]  vitamin B-12 (CYANOCOBALAMIN) 500 MCG tablet Take 500 mcg by mouth daily.   Yes [provider]  vitamin C (ASCORBIC ACID) 500 MG tablet Take 500 mg by mouth daily.   Yes [provider]  Zinc 50 MG CAPS Take 50 mg by mouth daily.    Yes [provider]  ACCU-CHEK SOFTCLIX LANCETS lancets Use to check blood glucose up to three times a day. Dx code E 11.9 08/25/16   Scherrie Gerlach, MD  pravastatin (PRAVACHOL) 20 MG tablet Take 1 tablet (20 mg total) by mouth daily. 12/23/11 02/11/12  Leodis Sias, MD     Review of Systems:  All reviewed and otherwise negative   Physical Exam: Blood pressure (!) 120/58, pulse 98, temperature 99.8 F (37.7 C), temperature source Oral, resp. rate 16, height  (1.727 m), weight 112 kg, SpO2 99 %. Vitals:   05/16/20 0434 05/16/20 0822  BP: 122/67 (!) 120/58  Pulse: 98 98  Resp: 16   Temp: 100.2 F (37.9 C) 99.8 F (37.7 C)  SpO2: 99% 99%     Calm, nad  cta no w/r/r  Regular s1/s2 no gallop  Soft benign, +bs  RLE wrapped, lle no edema  aaxox3  Labs on Admission:  Basic Metabolic Panel: Recent Labs  Lab 05/14/20 0606 05/15/20 0011  NA 136 135  K 4.2 4.1  CL 103 103  CO2 25 25  GLUCOSE 113* 130*  BUN 28* 24*  CREATININE 1.44* 1.38*  CALCIUM 9.6 8.3*  MG  --  1.5*   Liver Function Tests: No results for input(s): AST, ALT, ALKPHOS, BILITOT, PROT, ALBUMIN in the last 168 hours. No results for input(s): LIPASE, AMYLASE in the last 168 hours. No results for input(s): AMMONIA in the last 168  hours. CBC: Recent Labs  Lab 05/14/20 0606 05/15/20 0011 05/15/20 1412  WBC 8.2 10.7* 9.7  HGB 10.9* 8.2* 7.2*  HCT 34.1* 24.4* 22.5*  MCV 89.0 87.5 90.4  PLT 208 165 147*   Cardiac Enzymes: No results for input(s): CKTOTAL, CKMB, CKMBINDEX, TROPONINI in the last 168 hours. BNP: Invalid input(s): POCBNP CBG: Recent Labs  Lab 05/15/20 1143 05/15/20 1713 05/15/20 1938 05/16/20 0641 05/16/20 1136  GLUCAP 162* 124* 161* 126* 130*    Time spent:35 min  Loni Abdon [facility]  If 7PM-7AM, please contact night-coverage [email or web address] Password [email or web address] 05/16/2020, 1:11 PM

## 2020-05-17 LAB — CBC
HCT: 18.9 % — ABNORMAL LOW (ref 36.0–46.0)
Hemoglobin: 6.2 g/dL — CL (ref 12.0–15.0)
MCH: 28.7 pg (ref 26.0–34.0)
MCHC: 32.8 g/dL (ref 30.0–36.0)
MCV: 87.5 fL (ref 80.0–100.0)
Platelets: 138 10*3/uL — ABNORMAL LOW (ref 150–400)
RBC: 2.16 MIL/uL — ABNORMAL LOW (ref 3.87–5.11)
RDW: 15.4 % (ref 11.5–15.5)
WBC: 9.3 10*3/uL (ref 4.0–10.5)
nRBC: 0 % (ref 0.0–0.2)

## 2020-05-17 LAB — BASIC METABOLIC PANEL
Anion gap: 6 (ref 5–15)
BUN: 17 mg/dL (ref 8–23)
CO2: 23 mmol/L (ref 22–32)
Calcium: 8.2 mg/dL — ABNORMAL LOW (ref 8.9–10.3)
Chloride: 105 mmol/L (ref 98–111)
Creatinine, Ser: 1.33 mg/dL — ABNORMAL HIGH (ref 0.44–1.00)
GFR, Estimated: 45 mL/min — ABNORMAL LOW (ref >60)
Glucose, Bld: 127 mg/dL — ABNORMAL HIGH (ref 70–99)
Potassium: 4.1 mmol/L (ref 3.5–5.1)
Sodium: 134 mmol/L — ABNORMAL LOW (ref 135–145)

## 2020-05-17 LAB — GLUCOSE, CAPILLARY
Glucose-Capillary: 185 mg/dL — ABNORMAL HIGH (ref 70–99)
Glucose-Capillary: 145 mg/dL — ABNORMAL HIGH (ref 70–99)
Glucose-Capillary: 152 mg/dL — ABNORMAL HIGH (ref 70–99)
Glucose-Capillary: 128 mg/dL — ABNORMAL HIGH (ref 70–99)

## 2020-05-17 LAB — PREPARE RBC (CROSSMATCH)

## 2020-05-17 LAB — HEMOGLOBIN AND HEMATOCRIT, BLOOD
HCT: 26 % — ABNORMAL LOW (ref 36.0–46.0)
Hemoglobin: 8.8 g/dL — ABNORMAL LOW (ref 12.0–15.0)

## 2020-05-17 MED ORDER — SODIUM CHLORIDE 0.9% IV SOLUTION: Freq: Once | INTRAVENOUS | Status: AC

## 2020-05-17 NOTE — Progress Notes (Signed)
Subjective: 3 Days Post-Op Procedure(s) (LRB): Right heelcord lengthening, subtalar and talonavicular arthrodesis, talus exostectomy, talus corrective osteotomy (Right) Patient reports pain as mild.  No c/o this morning.   Objective: Vital signs in last 24 hours: Temp:  [98.2 F (36.8 C)-99.7 F (37.6 C)] 98.2 F (36.8 C) (05/01 0818) Pulse Rate:  [83-94] 94 (05/01 0818) Resp:  [15-20] 16 (05/01 0818) BP: (113-155)/(53-63) 116/56 (05/01 0818) SpO2:  [97 %-100 %] 97 % (05/01 0818)  Intake/Output from previous day: 04/30 0701 - 05/01 0700 In: 127.8 [P.O.:120; I.V.:7.8] Out: -  Intake/Output this shift: Total I/O In: 630 [Blood:630] Out: -   Recent Labs    05/15/20 0011 05/15/20 1412 05/17/20 0047  HGB 8.2* 7.2* 6.2*   Recent Labs    05/15/20 1412 05/17/20 0047  WBC 9.7 9.3  RBC 2.49* 2.16*  HCT 22.5* 18.9*  PLT 147* 138*   Recent Labs    05/15/20 0011 05/17/20 0047  NA 135 134*  K 4.1 4.1  CL 103 105  CO2 25 23  BUN 24* 17  CREATININE 1.38* 1.33*  GLUCOSE 130* 127*  CALCIUM 8.3* 8.2*   No results for input(s): LABPT, INR in the last 72 hours.  Neurologically intact ABD soft Neurovascular intact Sensation intact distally Intact pulses distally Dorsiflexion/Plantar flexion intact Incision: dressing C/D/I and no drainage No cellulitis present Compartment soft no sign of DVT   Assessment/Plan: 3 Days Post-Op Procedure(s) (LRB): Right heelcord lengthening, subtalar and talonavicular arthrodesis, talus exostectomy, talus corrective osteotomy (Right) Advance diet Up with therapy D/C IV fluids  ABLA: HgB dropped to 6.2 overnight.  2 units PRBC being transfused Awaiting SNF for D/C  Darrick Grinder 05/17/2020, 8:34 AM

## 2020-05-17 NOTE — Progress Notes (Signed)
PROGRESS NOTE    [name]  VQM:[id number] DOB: 1955/03/07 DOA: 05/14/2020 PCP: Chevis Pretty, MD    Brief Narrative:  Reason for Consult:Assistance with postoperative management of multiple chronic medicalproblems, including that oftype 2 diabetes mellitus andstage IIIa chronic kidney disease.  DTO:[id number] Sarah Enochis a 65 y.o.femalewith medical history significant fortype 2 diabetes mellitus with most recent hemoglobin A1c 6.3% in February 2022 and complicated by right Charcotneuroarthropathy,stage IIIa chronic kidney disease with baseline creatinine 1.2-1.5, hyperlipidemia, hypertension, chronic normocytic anemia with baseline hemoglobin 9-11, who is admitted to [facility] on4/28/2022by the orthopedic surgery serviceforsurgical treatment of right Charcot neuropathy.   5/1-Hg 6.2- getting 2 units PRBC  Impression/Recommendations Principal Problem:   Charcot's joint, right ankle and foot Active Problems:   Diabetes mellitus type II, controlled (HCC)   Essential hypertension   Normocytic anemia   GERD (gastroesophageal reflux disease)   CKD (chronic kidney disease) stage 3, GFR 30-59 ml/min (HCC)   Hyperlipidemia  #Acute post op blood loss- Hg 6.2.  S/p 2 units prbc Monitor cbc   #) Type 2 diabetes mellitus: Appears to be well controlled as an outpatient, hemoglobin A1c noted to be 6.3% in February 2022.  Not on any insulin as an outpatient,but rather is on Janumet as her sole outpatient oral hypoglycemic agent. 5/1-BG stable Continue RISS Carb control diet     #) Stage IIIa chronic kidney disease: Associated with baseline creatinine of 1.2-1.5. Renal function remained stable  Avoid nephrotoxic meds  Monitor periodically      #Hypomagnesimia Was replaced. Will check a.m. level    #) Hyperlipidemia:continue statin    #) Essential hypertension:hypotensive now. Responded to fluid. 4/30-continue  to hold bp meds   #) Chronic normocytic anemia: Given the history of such, with baseline hemoglobin noted to be 9-11. Hg 7.2 this am. Likely dilutional and post op.  If Hg <7 should transfuse. Continue fes04.  #) GERD:on ppi   #)rightCharcot neuroarthropathy:mx per primary team  Subjective: No complaints feels better after getting blood transfusion and she is feeling fatigued  Objective: Vitals:   05/17/20 0800 05/17/20 0818 05/17/20 1052 05/17/20 1053  BP: (!) 120/58 (!) 116/56 (!) 131/55 (!) 131/55  Pulse: 90 94 84 84  Resp: 17 16 18 18   Temp: 99.4 F (37.4 C) 98.2 F (36.8 C) 98.4 F (36.9 C) 98.4 F (36.9 C)  TempSrc: Oral Oral Oral Oral  SpO2: 99% 97% 99%   Weight:      Height:        Intake/Output Summary (Last 24 hours) at 05/17/2020 1346 Last data filed at 05/17/2020 1053 Gross per 24 hour  Intake 1437.83 ml  Output --  Net 1437.83 ml   Filed Weights   05/14/20 0546 05/14/20 0713  Weight: 112 kg 112 kg    Examination:  General exam: Appears calm and comfortable  Respiratory system: Clear to auscultation. Respiratory effort normal. Cardiovascular system: S1 & S2 heard, RRR. No JVD, murmurs, rubs, gallops or clicks.  Gastrointestinal system: Abdomen is nondistended, soft and nontender. . Normal bowel sounds heard. Central nervous system: Alert and oriented. No focal neurological deficits. Extremities: No edema right lower extremity wrapped Psychiatry: Judgement and insight appear normal. Mood & affect appropriate.     Data Reviewed: I have personally reviewed following labs and imaging studies  CBC: Recent Labs  Lab 05/14/20 0606 05/15/20 0011 05/15/20 1412 05/17/20 0047  WBC 8.2 10.7* 9.7 9.3  HGB 10.9* 8.2* 7.2* 6.2*  HCT 34.1* 24.4* 22.5* 18.9*  MCV 89.0 87.5 90.4 87.5  PLT 208 165 147* 138*   Basic Metabolic Panel: Recent Labs  Lab 05/14/20 0606 05/15/20 0011 05/17/20 0047  NA 136 135 134*  K 4.2 4.1 4.1  CL 103 103 105   CO2 25 25 23   GLUCOSE 113* 130* 127*  BUN 28* 24* 17  CREATININE 1.44* 1.38* 1.33*  CALCIUM 9.6 8.3* 8.2*  MG  --  1.5*  --    GFR: Estimated Creatinine Clearance: 56.1 mL/min (A) (by C-G formula based on SCr of 1.33 mg/dL (H)). Liver Function Tests: No results for input(s): AST, ALT, ALKPHOS, BILITOT, PROT, ALBUMIN in the last 168 hours. No results for input(s): LIPASE, AMYLASE in the last 168 hours. No results for input(s): AMMONIA in the last 168 hours. Coagulation Profile: No results for input(s): INR, PROTIME in the last 168 hours. Cardiac Enzymes: No results for input(s): CKTOTAL, CKMB, CKMBINDEX, TROPONINI in the last 168 hours. BNP (last 3 results) No results for input(s): PROBNP in the last 8760 hours. HbA1C: No results for input(s): HGBA1C in the last 72 hours. CBG: Recent Labs  Lab 05/16/20 1136 05/16/20 1644 05/16/20 2001 05/17/20 0635 05/17/20 1218  GLUCAP 130* 125* 134* 185* 145*   Lipid Profile: No results for input(s): CHOL, HDL, LDLCALC, TRIG, CHOLHDL, LDLDIRECT in the last 72 hours. Thyroid Function Tests: No results for input(s): TSH, T4TOTAL, FREET4, T3FREE, THYROIDAB in the last 72 hours. Anemia Panel: No results for input(s): VITAMINB12, FOLATE, FERRITIN, TIBC, IRON, RETICCTPCT in the last 72 hours. Sepsis Labs: No results for input(s): PROCALCITON, LATICACIDVEN in the last 168 hours.  Recent Results (from the past 240 hour(s))  SARS CORONAVIRUS 2 (TAT 6-24 HRS) Nasopharyngeal Nasopharyngeal Swab     Status: None   Collection Time: 05/12/20 11:17 AM   Specimen: Nasopharyngeal Swab  Result Value Ref Range Status   SARS Coronavirus 2 NEGATIVE NEGATIVE Final    Comment: (NOTE) SARS-CoV-2 target nucleic acids are NOT DETECTED.  The SARS-CoV-2 RNA is generally detectable in upper and lower respiratory specimens during the acute phase of infection. Negative results do not preclude SARS-CoV-2 infection, do not rule out co-infections with other  pathogens, and should not be used as the sole basis for treatment or other patient management decisions. Negative results must be combined with clinical observations, patient history, and epidemiological information. The expected result is Negative.  Fact Sheet for Patients: 05/14/20  Fact Sheet for Healthcare Providers: HairSlick.no  This test is not yet approved or cleared by the quierodirigir.com FDA and  has been authorized for detection and/or diagnosis of SARS-CoV-2 by FDA under an Emergency Use Authorization (EUA). This EUA will remain  in effect (meaning this test can be used) for the duration of the COVID-19 declaration under Se ction 564(b)(1) of the Act, 21 U.S.C. section 360bbb-3(b)(1), unless the authorization is terminated or revoked sooner.  Performed at [facility], [address], [place], [place]   Surgical PCR screen     Status: Abnormal   Collection Time: 05/14/20  7:38 AM   Specimen: Nasal Mucosa; Nasal Swab  Result Value Ref Range Status   MRSA, PCR NEGATIVE NEGATIVE Final   Staphylococcus aureus POSITIVE (A) NEGATIVE Final    Comment: (NOTE) The Xpert SA Assay (FDA approved for NASAL specimens in patients 51 years of age and older), is one component of a comprehensive surveillance program. It is not intended to diagnose infection nor to guide or monitor treatment. Performed at [facility]  Lab, [address] [place], [place] [address]          Radiology Studies: No results found.      Scheduled Meds: . atorvastatin  40 mg Oral Daily  . brimonidine  1 drop Both Eyes Q12H   And  . timolol  1 drop Both Eyes BID  . cholecalciferol  2,000 Units Oral Daily  . cycloSPORINE  1 drop Both Eyes BID  . docusate sodium  100 mg Oral BID  . enoxaparin (LOVENOX) injection  40 mg Subcutaneous Q24H  . ferrous sulfate  325 mg Oral Q breakfast  . insulin aspart  0-9 Units  Subcutaneous TID WC  . mupirocin ointment  1 application Nasal BID  . Netarsudil-Latanoprost  1 drop Both Eyes Q2200  . pantoprazole  20 mg Oral BID  . senna  1 tablet Oral BID  . vitamin B-12  500 mcg Oral Daily   Continuous Infusions: . sodium chloride 75 mL/hr at 05/16/20 0335  . methocarbamol (ROBAXIN) IV               LOS: 3 days   Time spent: 35 minutes    Lynn Ito, MD [facility] Pager [phone]  If 7PM-7AM, please contact night-coverage 05/17/2020, 1:46 PM

## 2020-05-17 NOTE — Progress Notes (Signed)
[facility] night shift.  The staff reported that the patient's hemoglobin level has decreased to 6.2 g/dL.  Her previous result was 7.2 g/dL.  Her baseline is around 9-11 g/dL.  2 PRBC units transfusion ordered.  Sanda Klein, MD

## 2020-05-17 NOTE — Plan of Care (Signed)

## 2020-05-17 NOTE — Plan of Care (Signed)
  Problem: Education: Goal: Knowledge of General Education information will improve Description: Including pain rating scale, medication(s)/side effects and non-pharmacologic comfort measures Outcome: Progressing   Problem: Health Behavior/Discharge Planning: Goal: Ability to manage health-related needs will improve Outcome: Progressing   Problem: Activity: Goal: Risk for activity intolerance will decrease Outcome: Progressing   Problem: Nutrition: Goal: Adequate nutrition will be maintained Outcome: Progressing   Problem: Elimination: Goal: Will not experience complications related to urinary retention Outcome: Progressing   Problem: Pain Managment: Goal: General experience of comfort will improve Outcome: Progressing   Problem: Safety: Goal: Ability to remain free from injury will improve Outcome: Progressing

## 2020-05-18 LAB — TYPE AND SCREEN
ABO/RH(D): A POS
Antibody Screen: NEGATIVE
Unit division: 0
Unit division: 0

## 2020-05-18 LAB — CBC
HCT: 26 % — ABNORMAL LOW (ref 36.0–46.0)
Hemoglobin: 8.5 g/dL — ABNORMAL LOW (ref 12.0–15.0)
MCH: 28.3 pg (ref 26.0–34.0)
MCHC: 32.7 g/dL (ref 30.0–36.0)
MCV: 86.7 fL (ref 80.0–100.0)
Platelets: 180 10*3/uL (ref 150–400)
RBC: 3 MIL/uL — ABNORMAL LOW (ref 3.87–5.11)
RDW: 14.6 % (ref 11.5–15.5)
WBC: 9.4 10*3/uL (ref 4.0–10.5)
nRBC: 0 % (ref 0.0–0.2)

## 2020-05-18 LAB — BPAM RBC
Blood Product Expiration Date: 202205232359
Blood Product Expiration Date: 202205232359
ISSUE DATE / TIME: 202205010500
ISSUE DATE / TIME: 202205010750
Unit Type and Rh: 6200
Unit Type and Rh: 6200

## 2020-05-18 LAB — GLUCOSE, CAPILLARY
Glucose-Capillary: 154 mg/dL — ABNORMAL HIGH (ref 70–99)
Glucose-Capillary: 201 mg/dL — ABNORMAL HIGH (ref 70–99)
Glucose-Capillary: 146 mg/dL — ABNORMAL HIGH (ref 70–99)
Glucose-Capillary: 142 mg/dL — ABNORMAL HIGH (ref 70–99)

## 2020-05-18 LAB — MAGNESIUM: Magnesium: 1.6 mg/dL — ABNORMAL LOW (ref 1.7–2.4)

## 2020-05-18 MED ORDER — NETARSUDIL-LATANOPROST 0.02-0.005 % OP SOLN
1.0000 [drp] | Freq: Every day | OPHTHALMIC | Status: DC
  Administered 2020-05-18 – 2020-05-19 (×2): 1 [drp] via OPHTHALMIC
  Filled 2020-05-18: qty 2.5

## 2020-05-18 MED ORDER — MAGNESIUM SULFATE 2 GM/50ML IV SOLN
2.0000 g | Freq: Once | INTRAVENOUS | Status: AC
  Administered 2020-05-18: 2 g via INTRAVENOUS
  Filled 2020-05-18: qty 50

## 2020-05-18 MED ORDER — MAGNESIUM OXIDE -MG SUPPLEMENT 400 (240 MG) MG PO TABS
800.0000 mg | ORAL_TABLET | Freq: Every day | ORAL | Status: DC
  Administered 2020-05-18 – 2020-05-19 (×2): 800 mg via ORAL
  Filled 2020-05-18 (×2): qty 2

## 2020-05-18 NOTE — Progress Notes (Signed)
PROGRESS NOTE    DRAKE LANDING  ZOX:[id number] DOB: 10-29-1955 DOA: 05/14/2020 PCP: Chevis Pretty, MD    Brief Narrative:  Reason for Consult:Assistance with postoperative management of multiple chronic medicalproblems, including that oftype 2 diabetes mellitus andstage IIIa chronic kidney disease.  WJX:BJYNWGNF Sarah Enochis a 65 y.o.femalewith medical history significant fortype 2 diabetes mellitus with most recent hemoglobin A1c 6.3% in February 2022 and complicated by right Charcotneuroarthropathy,stage IIIa chronic kidney disease with baseline creatinine 1.2-1.5, hyperlipidemia, hypertension, chronic normocytic anemia with baseline hemoglobin 9-11, who is admitted to [facility] on4/28/2022by the orthopedic surgery serviceforsurgical treatment of right Charcot neuropathy.   5/1-Hg 6.2- getting 2 units PRBC 5/2-Hg 8.5  Impression/Recommendations Principal Problem:   Charcot's joint, right ankle and foot Active Problems:   Diabetes mellitus type II, controlled (HCC)   Essential hypertension   Normocytic anemia   GERD (gastroesophageal reflux disease)   CKD (chronic kidney disease) stage 3, GFR 30-59 ml/min (HCC)   Hyperlipidemia  #Acute post op blood loss- Hg 6.2.  Status post 2 units of blood on 05/17/2021 Hemoglobin remained stable Continue to monitor periodically     #) Type 2 diabetes mellitus: Appears to be well controlled as an outpatient, hemoglobin A1c noted to be 6.3% in February 2022.  Not on any insulin as an outpatient,but rather is on Janumet as her sole outpatient oral hypoglycemic agent. 5/2 BG stable  Continue carb controlled diet  Continue R ISS      #) Stage IIIa chronic kidney disease: Associated with baseline creatinine of 1.2-1.5. Renal function remains stable  Avoid nephrotoxic meds  Monitor periodically       #Hypomagnesimia Magnesium 1.6, will give 2 g IV x1 Placed on magnesium 800 mg  daily    #) Hyperlipidemia:continue statin    #) Essential hypertension:hypotensive now. Responded to fluid. 4/30-continue to hold bp meds   #) Chronic normocytic anemia: Given the history of such, with baseline hemoglobin noted to be 9-11. See above If Hg <7 should transfuse. Continue fes04.  #) GERD:on ppi   #)rightCharcot neuroarthropathy:mx per primary team  Subjective: No complaints this AM.  Denies dizziness, chest pain, shortness of breath  Objective: Vitals:   05/17/20 2012 05/17/20 2328 05/18/20 0346 05/18/20 0735  BP: (!) 145/64  137/63 (!) 136/55  Pulse: 84 86 79 78  Resp:  16 18 17   Temp: 99.2 F (37.3 C)  99.1 F (37.3 C) 98.3 F (36.8 C)  TempSrc: Oral  Oral Oral  SpO2: 97% 98% 100% 98%  Weight:      Height:        Intake/Output Summary (Last 24 hours) at 05/18/2020 1353 Last data filed at 05/17/2020 2200 Gross per 24 hour  Intake 292.43 ml  Output --  Net 292.43 ml   Filed Weights   05/14/20 0546 05/14/20 0713  Weight: 112 kg 112 kg    Examination: NAD, calm CTA no wheeze rales rhonchi's Regular S1-S2 Soft benign positive bowel sounds No edema of the left lower extremity Alert oriented x3.     Data Reviewed: I have personally reviewed following labs and imaging studies  CBC: Recent Labs  Lab 05/14/20 0606 05/15/20 0011 05/15/20 1412 05/17/20 0047 05/17/20 1335 05/18/20 0336  WBC 8.2 10.7* 9.7 9.3  --  9.4  HGB 10.9* 8.2* 7.2* 6.2* 8.8* 8.5*  HCT 34.1* 24.4* 22.5* 18.9* 26.0* 26.0*  MCV 89.0 87.5 90.4 87.5  --  86.7  PLT 208 165 147* 138*  --  180  Basic Metabolic Panel: Recent Labs  Lab 05/14/20 0606 05/15/20 0011 05/17/20 0047 05/18/20 0336  NA 136 135 134*  --   K 4.2 4.1 4.1  --   CL 103 103 105  --   CO2 25 25 23   --   GLUCOSE 113* 130* 127*  --   BUN 28* 24* 17  --   CREATININE 1.44* 1.38* 1.33*  --   CALCIUM 9.6 8.3* 8.2*  --   MG  --  1.5*  --  1.6*   GFR: Estimated Creatinine  Clearance: 56.1 mL/min (A) (by C-G formula based on SCr of 1.33 mg/dL (H)). Liver Function Tests: No results for input(s): AST, ALT, ALKPHOS, BILITOT, PROT, ALBUMIN in the last 168 hours. No results for input(s): LIPASE, AMYLASE in the last 168 hours. No results for input(s): AMMONIA in the last 168 hours. Coagulation Profile: No results for input(s): INR, PROTIME in the last 168 hours. Cardiac Enzymes: No results for input(s): CKTOTAL, CKMB, CKMBINDEX, TROPONINI in the last 168 hours. BNP (last 3 results) No results for input(s): PROBNP in the last 8760 hours. HbA1C: No results for input(s): HGBA1C in the last 72 hours. CBG: Recent Labs  Lab 05/17/20 1218 05/17/20 1626 05/17/20 2108 05/18/20 0729 05/18/20 1143  GLUCAP 145* 152* 128* 154* 201*   Lipid Profile: No results for input(s): CHOL, HDL, LDLCALC, TRIG, CHOLHDL, LDLDIRECT in the last 72 hours. Thyroid Function Tests: No results for input(s): TSH, T4TOTAL, FREET4, T3FREE, THYROIDAB in the last 72 hours. Anemia Panel: No results for input(s): VITAMINB12, FOLATE, FERRITIN, TIBC, IRON, RETICCTPCT in the last 72 hours. Sepsis Labs: No results for input(s): PROCALCITON, LATICACIDVEN in the last 168 hours.  Recent Results (from the past 240 hour(s))  SARS CORONAVIRUS 2 (TAT 6-24 HRS) Nasopharyngeal Nasopharyngeal Swab     Status: None   Collection Time: 05/12/20 11:17 AM   Specimen: Nasopharyngeal Swab  Result Value Ref Range Status   SARS Coronavirus 2 NEGATIVE NEGATIVE Final    Comment: (NOTE) SARS-CoV-2 target nucleic acids are NOT DETECTED.  The SARS-CoV-2 RNA is generally detectable in upper and lower respiratory specimens during the acute phase of infection. Negative results do not preclude SARS-CoV-2 infection, do not rule out co-infections with other pathogens, and should not be used as the sole basis for treatment or other patient management decisions. Negative results must be combined with clinical  observations, patient history, and epidemiological information. The expected result is Negative.  Fact Sheet for Patients: HairSlick.no  Fact Sheet for Healthcare Providers: quierodirigir.com  This test is not yet approved or cleared by the [place] FDA and  has been authorized for detection and/or diagnosis of SARS-CoV-2 by FDA under an Emergency Use Authorization (EUA). This EUA will remain  in effect (meaning this test can be used) for the duration of the COVID-19 declaration under Se ction 564(b)(1) of the Act, 21 U.S.C. section 360bbb-3(b)(1), unless the authorization is terminated or revoked sooner.  Performed at [facility], [address], [place], [place] [address]   Surgical PCR screen     Status: Abnormal   Collection Time: 05/14/20  7:38 AM   Specimen: Nasal Mucosa; Nasal Swab  Result Value Ref Range Status   MRSA, PCR NEGATIVE NEGATIVE Final   Staphylococcus aureus POSITIVE (A) NEGATIVE Final    Comment: (NOTE) The Xpert SA Assay (FDA approved for NASAL specimens in patients 43 years of age and older), is one component of a comprehensive surveillance program. It is not intended  to diagnose infection nor to guide or monitor treatment. Performed at [facility], [address] [place], [place] [address]          Radiology Studies: No results found.      Scheduled Meds: . atorvastatin  40 mg Oral Daily  . brimonidine  1 drop Both Eyes Q12H   And  . timolol  1 drop Both Eyes BID  . cholecalciferol  2,000 Units Oral Daily  . cycloSPORINE  1 drop Both Eyes BID  . docusate sodium  100 mg Oral BID  . enoxaparin (LOVENOX) injection  40 mg Subcutaneous Q24H  . ferrous sulfate  325 mg Oral Q breakfast  . insulin aspart  0-9 Units Subcutaneous TID WC  . mupirocin ointment  1 application Nasal BID  . Netarsudil-Latanoprost  1 drop Both Eyes Q2200  . pantoprazole  20 mg Oral BID  .  senna  1 tablet Oral BID  . vitamin B-12  500 mcg Oral Daily   Continuous Infusions: . sodium chloride 75 mL/hr at 05/16/20 0335  . methocarbamol (ROBAXIN) IV               LOS: 4 days   Time spent: 35 minutes    Lynn Ito, MD [facility] Pager [phone]  If 7PM-7AM, please contact night-coverage 05/18/2020, 1:53 PM

## 2020-05-18 NOTE — TOC Progression Note (Addendum)
Transition of Care [facility]) - Progression Note    Patient Details  Name: [name] MRN: [id number] Date of Birth: 1955-06-22  Transition of Care [facility]) CM/SW Contact  Epifanio Lesches, RN Phone Number: 05/18/2020, 2:19 PM  Clinical Narrative:    SNF bed offers shared with pt. Pt selected [facility] and bed offer extended by [facility]. Insurance authorization pending.  05/18/2020 @ 1610 [employer] ID [id number], approved x 3 day start date 05/18/2020-05/20/2020, CM Lura Em # [phone]  Expected Discharge Plan: Skilled Nursing Facility Barriers to Discharge: Continued Medical Work up,Insurance Authorization  Expected Discharge Plan and Services Expected Discharge Plan: Skilled Nursing Facility                                               Social Determinants of Health (SDOH) Interventions    Readmission Risk Interventions No flowsheet data found.

## 2020-05-18 NOTE — Plan of Care (Signed)
?  Problem: Education: ?Goal: Knowledge of General Education information will improve ?Description: Including pain rating scale, medication(s)/side effects and non-pharmacologic comfort measures ?Outcome: Progressing ?  ?Problem: Health Behavior/Discharge Planning: ?Goal: Ability to manage health-related needs will improve ?Outcome: Progressing ?  ?Problem: Clinical Measurements: ?Goal: Ability to maintain clinical measurements within normal limits will improve ?Outcome: Progressing ?  ?Problem: Activity: ?Goal: Risk for activity intolerance will decrease ?Outcome: Progressing ?  ?Problem: Nutrition: ?Goal: Adequate nutrition will be maintained ?Outcome: Progressing ?  ?Problem: Elimination: ?Goal: Will not experience complications related to bowel motility ?Outcome: Progressing ?Goal: Will not experience complications related to urinary retention ?Outcome: Progressing ?  ?Problem: Pain Managment: ?Goal: General experience of comfort will improve ?Outcome: Progressing ?  ?Problem: Safety: ?Goal: Ability to remain free from injury will improve ?Outcome: Progressing ?  ?

## 2020-05-18 NOTE — Plan of Care (Signed)
Right foot dressing reinforced. Patient denies any pain. Remains NWB. CPAP while asleep. Still missing eye drops, patient reminded and stated some would be able to bring this in today. Problem: Education: Goal: Knowledge of General Education information will improve Description: Including pain rating scale, medication(s)/side effects and non-pharmacologic comfort measures Outcome: Progressing   Problem: Health Behavior/Discharge Planning: Goal: Ability to manage health-related needs will improve Outcome: Progressing   Problem: Clinical Measurements: Goal: Ability to maintain clinical measurements within normal limits will improve Outcome: Progressing Goal: Will remain free from infection Outcome: Progressing Goal: Diagnostic test results will improve Outcome: Progressing Goal: Respiratory complications will improve Outcome: Progressing Goal: Cardiovascular complication will be avoided Outcome: Progressing   Problem: Activity: Goal: Risk for activity intolerance will decrease Outcome: Progressing   Problem: Nutrition: Goal: Adequate nutrition will be maintained Outcome: Progressing   Problem: Coping: Goal: Level of anxiety will decrease Outcome: Progressing   Problem: Elimination: Goal: Will not experience complications related to bowel motility Outcome: Progressing Goal: Will not experience complications related to urinary retention Outcome: Progressing   Problem: Pain Managment: Goal: General experience of comfort will improve Outcome: Progressing   Problem: Safety: Goal: Ability to remain free from injury will improve Outcome: Progressing   Problem: Skin Integrity: Goal: Risk for impaired skin integrity will decrease Outcome: Progressing

## 2020-05-18 NOTE — NC FL2 (Signed)
MEDICAID FL2 LEVEL OF CARE SCREENING TOOL     IDENTIFICATION  Patient Name: [name] Birthdate: Jan 13, 1956 Sex: female Admission Date (Current Location): 05/14/2020  [facility] and [place] Number:  Producer, television/film/video and Address:  [facility], [address] [place], [place] [address]      Provider Number: [id number]  Attending Physician Name and Address:  Toni Arthurs, MD  Relative Name and Phone Number:       Current Level of Care: Hospital Recommended Level of Care: Skilled Nursing Facility Prior Approval Number:    Date Approved/Denied:   PASRR Number: [id number]  Discharge Plan: SNF    Current Diagnoses: Patient Active Problem List   Diagnosis Date Noted  . Charcot's joint, right ankle and foot 05/14/2020  . Pre-op evaluation 12/17/2019  . Osteomyelitis of left foot (HCC) 03/02/2019  . Diabetic ulcer of both feet (HCC) 08/08/2018  . Right ankle swelling 02/08/2018  . Obesity 05/18/2017  . Hyperlipidemia 11/12/2014  . Charcot foot due to diabetes mellitus (HCC) 11/06/2014  . CKD (chronic kidney disease) stage 3, GFR 30-59 ml/min (HCC) 02/02/2013  . GERD (gastroesophageal reflux disease) 03/09/2012  . Screening mammogram, encounter for 05/26/2010  . Obstructive sleep apnea 05/26/2010  . Normocytic anemia 01/12/2010  . Diabetes mellitus type II, controlled (HCC) 06/17/2009  . Essential hypertension 06/17/2009    Orientation RESPIRATION BLADDER Height & Weight     Self,Time,Situation  Normal Continent Weight: 112 kg Height:  5\' 8"  (172.7 cm)  BEHAVIORAL SYMPTOMS/MOOD NEUROLOGICAL BOWEL NUTRITION STATUS      Continent Diet (refer to d/c summary)  AMBULATORY STATUS COMMUNICATION OF NEEDS Skin   Extensive Assist Verbally Surgical wounds (s/p R heelcord lengthening, subtalar and talonavicular arthrodesis, talus exostectomy, talus corrective osteotomy, 4/28)                       Personal Care Assistance Level  of Assistance  Bathing,Feeding,Dressing Bathing Assistance: Maximum assistance Feeding assistance: Independent Dressing Assistance: Maximum assistance     Functional Limitations Info  Sight,Hearing,Speech Sight Info: Adequate Hearing Info: Adequate Speech Info: Adequate    SPECIAL CARE FACTORS FREQUENCY  PT (By licensed PT),OT (By licensed OT)     PT Frequency: 5x/week, evaluate and treat OT Frequency: 5x/week, evaluate and treat            Contractures Contractures Info: Not present    Additional Factors Info  Code Status,Allergies Code Status Info: full code Allergies Info: Codeine, Pravastatin, Latex           Current Medications (05/18/2020):  This is the current hospital active medication list Current Facility-Administered Medications  Medication Dose Route Frequency Provider Last Rate Last Admin  . 0.9 %  sodium chloride infusion   Intravenous Continuous 07/18/2020, Jacinta Shoe 75 mL/hr at 05/16/20 0335 New Bag at 05/16/20 0335  . acetaminophen (TYLENOL) tablet 325-650 mg  325-650 mg Oral Q6H PRN 05/18/20, PA-C   650 mg at 05/17/20 1702  . atorvastatin (LIPITOR) tablet 40 mg  40 mg Oral Daily 07/17/20, PA-C   40 mg at 05/18/20 0845  . brimonidine (ALPHAGAN) 0.2 % ophthalmic solution 1 drop  1 drop Both Eyes Q12H 07/18/20, RPH   1 drop at 05/18/20 0847   And  . timolol (TIMOPTIC) 0.5 % ophthalmic solution 1 drop  1 drop Both Eyes BID 07/18/20, [facility]   1 drop at 05/18/20 0846  .  cholecalciferol (VITAMIN D3) tablet 2,000 Units  2,000 Units Oral Daily Jacinta Shoe, PA-C   2,000 Units at 05/18/20 0845  . cycloSPORINE (RESTASIS) 0.05 % ophthalmic emulsion 1 drop  1 drop Both Eyes BID Jacinta Shoe, [place]   1 drop at 05/18/20 3383  . diphenhydrAMINE (BENADRYL) 12.5 MG/5ML elixir 12.5-25 mg  12.5-25 mg Oral Q4H PRN Jacinta Shoe, PA-C      . docusate sodium (COLACE) capsule 100 mg  100 mg Oral BID Jacinta Shoe, PA-C   100 mg  at 05/18/20 0845  . enoxaparin (LOVENOX) injection 40 mg  40 mg Subcutaneous Q24H Jacinta Shoe, PA-C   40 mg at 05/18/20 0846  . ferrous sulfate tablet 325 mg  325 mg Oral Q breakfast Jacinta Shoe, [place]   325 mg at 05/18/20 0845  . HYDROmorphone (DILAUDID) injection 0.5-1 mg  0.5-1 mg Intravenous Q4H PRN Jacinta Shoe, PA-C      . insulin aspart (novoLOG) injection 0-9 Units  0-9 Units Subcutaneous TID WC Howerter, Justin B, DO   3 Units at 05/18/20 1156  . methocarbamol (ROBAXIN) tablet 500 mg  500 mg Oral Q6H PRN Jacinta Shoe, PA-C   500 mg at 05/17/20 1702   Or  . methocarbamol (ROBAXIN) 500 mg in dextrose 5 % 50 mL IVPB  500 mg Intravenous Q6H PRN Jacinta Shoe, PA-C      . mupirocin ointment (BACTROBAN) 2 % 1 application  1 application Nasal BID Jacinta Shoe, [place]   1 application at 05/18/20 2919  . Netarsudil-Latanoprost 0.02-0.005 % SOLN 1 drop  1 drop Both Eyes Q2200 Jacinta Shoe, PA-C      . ondansetron [facility]) tablet 4 mg  4 mg Oral Q6H PRN Jacinta Shoe, PA-C       Or  . ondansetron [place]) injection 4 mg  4 mg Intravenous Q6H PRN Jacinta Shoe, PA-C      . oxyCODONE (Oxy IR/ROXICODONE) immediate release tablet 10-15 mg  10-15 mg Oral Q4H PRN Jacinta Shoe, PA-C   10 mg at 05/16/20 1421  . oxyCODONE (Oxy IR/ROXICODONE) immediate release tablet 5-10 mg  5-10 mg Oral Q4H PRN Jacinta Shoe, PA-C   10 mg at 05/15/20 1660  . pantoprazole (PROTONIX) EC tablet 20 mg  20 mg Oral BID Jacinta Shoe, PA-C   20 mg at 05/18/20 0845  . senna (SENOKOT) tablet 8.6 mg  1 tablet Oral BID Jacinta Shoe, PA-C   8.6 mg at 05/18/20 6004  . vitamin B-12 (CYANOCOBALAMIN) tablet 500 mcg  500 mcg Oral Daily Jacinta Shoe, PA-C   500 mcg at 05/18/20 5997     Discharge Medications: Please see discharge summary for a list of discharge medications.  Relevant Imaging Results:  Relevant Lab Results:   Additional Information SS#  [id number]  Epifanio Lesches, RN

## 2020-05-18 NOTE — Plan of Care (Signed)

## 2020-05-18 NOTE — Progress Notes (Addendum)
Placed patient on CPAP previous settings Ipap-16. Epap 4, with 1.5 L O2 bleed in. Tolerating well at this time.

## 2020-05-18 NOTE — Progress Notes (Signed)
Subjective: 4 Days Post-Op Procedure(s) (LRB): Right heelcord lengthening, subtalar and talonavicular arthrodesis, talus exostectomy, talus corrective osteotomy (Right)  Patient reports pain as mild to moderate.  Reports that she feels better today after receiving 2 units PRBCs yesterday.  Tolerating POs well.  Denies fever, chills, N/V, CP, SOB.  Objective:   VITALS:  Temp:  [98.2 F (36.8 C)-99.4 F (37.4 C)] 99.1 F (37.3 C) (05/02 0346) Pulse Rate:  [79-94] 79 (05/02 0346) Resp:  [16-18] 18 (05/02 0346) BP: (116-145)/(55-64) 137/63 (05/02 0346) SpO2:  [97 %-100 %] 100 % (05/02 0346)  General: WDWN patient in NAD. Psych:  Appropriate mood and affect. Neuro:  A&O x 3, Moving all extremities, sensation intact to light touch HEENT:  EOMs intact Chest:  Even non-labored respirations Skin:  SLS C/D/I, no rashes or lesions Extremities: warm/dry, no visible edema, erythema or echymosis.  No lymphadenopathy. Pulses: Popliteus 2+ MSK:  ROM: EHL/FHL intact, MMT: able to perform quad set    LABS Recent Labs    05/15/20 1412 05/17/20 0047 05/17/20 1335 05/18/20 0336  HGB 7.2* 6.2* 8.8* 8.5*  WBC 9.7 9.3  --  9.4  PLT 147* 138*  --  180   Recent Labs    05/17/20 0047  NA 134*  K 4.1  CL 105  CO2 23  BUN 17  CREATININE 1.33*  GLUCOSE 127*   No results for input(s): LABPT, INR in the last 72 hours.   Assessment/Plan: 4 Days Post-Op Procedure(s) (LRB): Right heelcord lengthening, subtalar and talonavicular arthrodesis, talus exostectomy, talus corrective osteotomy (Right)  NWB R LE Up with therapy DVT ppx:  lovenox in house: Xarelto upon D/C D/C scripts on chart Disp: SNF, awaiting placement Please let me know when patient is ready for D/C and I will place order.  [name] PA-C [facility] Office:  [phone]  Florentina Addison [facility] 05/18/2020, 7:25 AM

## 2020-05-18 NOTE — Progress Notes (Signed)
Physical Therapy Treatment Patient Details Name: [name] MRN: [id number] DOB: 02/06/1955 Today's Date: 05/18/2020    History of Present Illness HPI: [name] is a 65 y.o. female admitted 4/28 following excision of the ulcer and arthrodesis of the TN and ST joints complicated by charco neuroarthropathy. pt with medical history significant for type 2 diabetes mellitus.    PT Comments    Pt in good spirits, reports she is finally able to wiggle her right toes, sensation intact. She was happy to get up and move out of the bed, min assist overall and better compliance with NWB R leg.  Unable to hop, had to pivot on her left leg.  Pt remains appropriate for SNF level rehab at discharge.  Add more seated LE exercises next session bil for strength and circulation.    Follow Up Recommendations  SNF     Equipment Recommendations  Wheelchair (measurements PT);Wheelchair cushion (measurements PT);Rolling walker with 5" wheels;3in1 (PT);Hospital bed    Recommendations for Other Services       Precautions / Restrictions Precautions Precautions: Fall Required Braces or Orthoses: Splint/Cast Restrictions Weight Bearing Restrictions: Yes RLE Weight Bearing: Non weight bearing    Mobility  Bed Mobility Overal bed mobility: Needs Assistance Bed Mobility: Supine to Sit     Supine to sit: Supervision;HOB elevated (moderate use of rail)          Transfers Overall transfer level: Needs assistance Equipment used: Rolling walker (2 wheeled) Transfers: Sit to/from [employer] Sit to Stand: Min assist;From elevated surface Stand pivot transfers: Min assist;From elevated surface       General transfer comment: Min assist to stand from elevated bed and pivot to her left to elevated BSC, stand and pivot again to lower recliner chair.  Difficulty attempting to hop, but better compliance with NWB on her right foot.  Ambulation/Gait             General Gait  Details: unable to hop yet   Stairs             Wheelchair Mobility    Modified Rankin (Stroke Patients Only)       Balance Overall balance assessment: Needs assistance Sitting-balance support: Feet supported;No upper extremity supported Sitting balance-Leahy Scale: Good     Standing balance support: Single extremity supported;Bilateral upper extremity supported Standing balance-Leahy Scale: Poor Standing balance comment: needs external support in standing and reliant on bil UE support on RW.                            Cognition Arousal/Alertness: Awake/alert Behavior During Therapy: WFL for tasks assessed/performed Overall Cognitive Status: Within Functional Limits for tasks assessed                                        Exercises      General Comments General comments (skin integrity, edema, etc.): encouraged leg elevation and reguler toe wiggles on R foot for edema management.  Total assist for pericare as she could not wipe and maintain balance on one leg.      Pertinent Vitals/Pain Pain Assessment: Faces Faces Pain Scale: Hurts even more Pain Location: R ankle/leg, worse in dependent position Pain Intervention(s): Limited activity within patient's tolerance;Monitored during session;Repositioned (elevated at end of session)    Home Living  Prior Function            PT Goals (current goals can now be found in the care plan section) Acute Rehab PT Goals Patient Stated Goal: To go get rehab Progress towards PT goals: Progressing toward goals    Frequency    Min 3X/week      PT Plan Current plan remains appropriate    Co-evaluation              AM-PAC PT "6 Clicks" Mobility   Outcome Measure  Help needed turning from your back to your side while in a flat bed without using bedrails?: A Little Help needed moving from lying on your back to sitting on the side of a flat bed without  using bedrails?: A Little Help needed moving to and from a bed to a chair (including a wheelchair)?: A Little Help needed standing up from a chair using your arms (e.g., wheelchair or bedside chair)?: A Little Help needed to walk in hospital room?: Total Help needed climbing 3-5 steps with a railing? : Total 6 Click Score: 14    End of Session Equipment Utilized During Treatment: Gait belt Activity Tolerance: Patient limited by fatigue;Patient limited by pain Patient left: in chair;with call bell/phone within reach;with family/visitor present;with chair alarm set   PT Visit Diagnosis: Unsteadiness on feet (R26.81);Other abnormalities of gait and mobility (R26.89);Muscle weakness (generalized) (M62.81)     Time: 1610-9604 PT Time Calculation (min) (ACUTE ONLY): 23 min  Charges:  $Therapeutic Activity: 23-37 mins                    Corinna Capra, PT, DPT  Acute Rehabilitation [phone] pager [phone]) [phone] office

## 2020-05-18 NOTE — Care Management Important Message (Signed)
Important Message  Patient Details  Name: [name] MRN: [id number] Date of Birth: [birth year]   Medicare Important Message Given:  Yes     Oralia Rud Lael Wetherbee 05/18/2020, 1:47 PM

## 2020-05-19 LAB — RESP PANEL BY RT-PCR (FLU A&B, COVID) ARPGX2
Influenza A by PCR: NEGATIVE
Influenza B by PCR: NEGATIVE
SARS Coronavirus 2 by RT PCR: NEGATIVE

## 2020-05-19 LAB — GLUCOSE, CAPILLARY
Glucose-Capillary: 148 mg/dL — ABNORMAL HIGH (ref 70–99)
Glucose-Capillary: 131 mg/dL — ABNORMAL HIGH (ref 70–99)
Glucose-Capillary: 142 mg/dL — ABNORMAL HIGH (ref 70–99)

## 2020-05-19 LAB — MAGNESIUM: Magnesium: 1.7 mg/dL (ref 1.7–2.4)

## 2020-05-19 NOTE — Progress Notes (Signed)
Occupational Therapy Treatment Patient Details Name: [name] MRN: [id number] DOB: [birth year] Today's Date: 05/19/2020    History of present illness HPI: [name] is a 65 y.o. female admitted 4/28 following excision of the ulcer and arthrodesis of the TN and ST joints complicated by charco neuroarthropathy. pt with medical history significant for type 2 diabetes mellitus.   OT comments  Pt is progressing towards goals, is pleasant and very appreciative of therapy. Pt completed ADLs in preparation for d/c to SNF. Required set up level for all upper body tasks while sitting, and mod A to manage skirt over hips in standing. Sit<>stand tranfers were completed at a min guard level with good demonstration of maintaining NWB status. Pt continues to benefit from OT acutely to progress function in mobility and ADLs. D/c plan remains appropriate.    Follow Up Recommendations  SNF;Supervision/Assistance - 24 hour    Equipment Recommendations  None recommended by OT       Precautions / Restrictions Precautions Precautions: Fall Required Braces or Orthoses: Splint/Cast Restrictions Weight Bearing Restrictions: Yes RLE Weight Bearing: Non weight bearing       Mobility Bed Mobility               General bed mobility comments: pt in chair upon arrival    Transfers Overall transfer level: Needs assistance Equipment used: Rolling walker (2 wheeled) Transfers: Sit to/from Stand Sit to Stand: Min guard         General transfer comment: sit<>stand from chair to complete lower body dressing tasks; min guard for safety. standing balance with rw    Balance Overall balance assessment: Needs assistance Sitting-balance support: Feet supported;No upper extremity supported Sitting balance-Leahy Scale: Good     Standing balance support: Single extremity supported;Bilateral upper extremity supported Standing balance-Leahy Scale: Poor                              ADL either performed or assessed with clinical judgement   ADL Overall ADL's : Needs assistance/impaired Eating/Feeding: Independent;Sitting   Grooming: Wash/dry hands;Wash/dry face;Applying deodorant;Oral care;Brushing hair;Set up;Sitting           Upper Body Dressing : Set up;Sitting   Lower Body Dressing: Moderate assistance;Sit to/from stand               Functional mobility during ADLs: Min guard;Rolling walker;Cueing for safety General ADL Comments: Pt requried mod A for managing LB clothing over bilat hips; pt with good adherence to NWB status               Cognition Arousal/Alertness: Awake/alert Behavior During Therapy: WFL for tasks assessed/performed             General Comments: Pt very pleasant, reports that her sister passed and has been very emotional today.              General Comments Pt R foot in splint cast and ace wrap; no other skin integrity issues noted    Pertinent Vitals/ Pain       Pain Assessment: Faces Faces Pain Scale: Hurts a little bit Pain Descriptors / Indicators: Discomfort;Grimacing Pain Intervention(s): Limited activity within patient's tolerance;Monitored during session         Frequency  Min 2X/week        Progress Toward Goals  OT Goals(current goals can now be found in the care plan section)  Progress towards OT goals: Progressing toward goals  Acute Rehab OT Goals Patient Stated Goal: To go get rehab OT Goal Formulation: With patient Time For Goal Achievement: 05/29/20 Potential to Achieve Goals: Fair ADL Goals Pt Will Perform Lower Body Bathing: with min guard assist;sit to/from stand Pt Will Perform Lower Body Dressing: with min guard assist;sit to/from stand Pt Will Transfer to Toilet: with min guard assist;stand pivot transfer;bedside commode Pt Will Perform Toileting - Clothing Manipulation and hygiene: with min guard assist;sit to/from stand  Plan Discharge plan remains appropriate        AM-PAC OT "6 Clicks" Daily Activity     Outcome Measure   Help from another person eating meals?: None Help from another person taking care of personal grooming?: A Little Help from another person toileting, which includes using toliet, bedpan, or urinal?: A Lot Help from another person bathing (including washing, rinsing, drying)?: A Lot Help from another person to put on and taking off regular upper body clothing?: A Little Help from another person to put on and taking off regular lower body clothing?: A Lot 6 Click Score: 16    End of Session Equipment Utilized During Treatment: Gait belt;Rolling walker  OT Visit Diagnosis: Unsteadiness on feet (R26.81);Other abnormalities of gait and mobility (R26.89);Pain Pain - Right/Left: Right Pain - part of body: Ankle and joints of foot   Activity Tolerance Patient tolerated treatment well   Patient Left in chair;with call bell/phone within reach;with chair alarm set   Nurse Communication Mobility status        Time: 1350-1410 OT Time Calculation (min): 20 min  Charges: OT General Charges $OT Visit: 1 Visit OT Treatments $Self Care/Home Management : 8-22 mins     Sharronda Schweers A Alejandra Barna 05/19/2020, 2:38 PM

## 2020-05-19 NOTE — TOC Transition Note (Signed)
Transition of Care [facility]) - CM/SW Discharge Note   Patient Details  Name: [name] MRN: [id number] Date of Birth: 09/25/1955  [facility] of [facility] CM/SW Contact:  Epifanio Lesches, RN Phone Number: 05/19/2020, 2:06 PM   Clinical Narrative:    Patient will DC to: home Anticipated DC date: 05/19/2020 Family notified:yes Transport by: Sharin Mons  Per MD patient ready for DC today . RN, patient, patient's family, and facility notified of DC. Discharge Summary and FL2 sent to facility. RN to call report prior to discharge [phone]). DC packet on chart. Ambulance transport requested for patient.   RNCM will sign off for now as intervention is no longer needed. Please consult [place] again if new needs arise.   Final next level of care: Skilled Nursing Facility Barriers to Discharge: No Barriers Identified   Patient Goals and CMS Choice Patient states their goals for this hospitalization and ongoing recovery are:: get better and go home CMS Medicare.gov Compare Post Acute Care list provided to:: Patient    Discharge Placement                       Discharge Plan and Services                                     Social Determinants of Health (SDOH) Interventions     Readmission Risk Interventions No flowsheet data found.

## 2020-05-19 NOTE — Plan of Care (Signed)

## 2020-05-19 NOTE — Discharge Summary (Signed)
Physician Discharge Summary  Patient ID: [name] MRN: [id number] DOB/AGE: Nov 27, 1955 65 y.o.  Admit date: 05/14/2020 Discharge date: 05/19/2020  Admission Diagnoses: Right charcot ankle neuroarthropathy, HTN, CDK, Diabetes type II, normocytic anemia, GERD, Hyperlipidemia, sleep apnea.  Discharge Diagnoses:  Principal Problem:   Charcot's joint, right ankle and foot Active Problems:   Diabetes mellitus type II, controlled (HCC)   Essential hypertension   Normocytic anemia   GERD (gastroesophageal reflux disease)   CKD (chronic kidney disease) stage 3, GFR 30-59 ml/min (HCC)   Hyperlipidemia Same as above  Discharged Condition: stable  Hospital Course: Patient presented to [facility] on 05/14/20 for elective Right ankle TTC nailing by Dr. Toni Arthurs.  The patient tolerated the procedure well and was admitted to the hospital.  She worked well with therapy.  She was given 2 units PRBCs for post-op anemia.  Otherwise she tolerated her stay well without difficulty.  Medicine service was consulted to manage her other multiple medical commorbidities.  She is to be D/C'd to SNF on 05/19/20.  Consults: Hospitalist service  Significant Diagnostic Studies: labs: Hgb and radiology: X-Ray: to ensure satisfactory anatomic alignment during operative procedure.  Treatments: IV hydration, antibiotics: Ancef, analgesia: acetaminophen, Dilaudid and oxycodone,anticoagulation: lovenox, insulin: Humalog, respiratory therapy: O2 and surgery: as stated above  Discharge Exam: Blood pressure 134/63, pulse 79, temperature 98.7 F (37.1 C), temperature source Oral, resp. rate 18, height 5\' 8"  (1.727 m), weight 112 kg, SpO2 98 %. General: WDWN patient in NAD. Psych:  Appropriate mood and affect. Neuro:  A&O x 3, Moving all extremities, sensation intact to light touch HEENT:  EOMs intact Chest:  Even non-labored respirations Skin:  SLS C/D/I, no rashes or lesions Extremities: warm/dry, no visible  edema, erythema or echymosis.  No lymphadenopathy. Pulses: Popliteus 2+ MSK:  ROM: EHL/FHL intact, MMT: able to perform quad set   Disposition: Discharge disposition: 03-Skilled Nursing Facility       Discharge Instructions    Call MD / Call 911   Complete by: As directed    If you experience chest pain or shortness of breath, CALL 911 and be transported to the hospital emergency room.  If you develope a fever above 101 F, pus (white drainage) or increased drainage or redness at the wound, or calf pain, call your surgeon's office.   Constipation Prevention   Complete by: As directed    Drink plenty of fluids.  Prune juice may be helpful.  You may use a stool softener, such as Colace (over the counter) 100 mg twice a day.  Use MiraLax (over the counter) for constipation as needed.   Diet - low sodium heart healthy   Complete by: As directed    Increase activity slowly as tolerated   Complete by: As directed    Non weight bearing   Complete by: As directed    Laterality: right   Extremity: Lower   Post-operative opioid taper instructions:   Complete by: As directed    POST-OPERATIVE OPIOID TAPER INSTRUCTIONS: It is important to wean off of your opioid medication as soon as possible. If you do not need pain medication after your surgery it is ok to stop day one. Opioids include: Codeine, Hydrocodone(Norco, Vicodin), Oxycodone(Percocet, oxycontin) and hydromorphone amongst others.  Long term and even short term use of opiods can cause: Increased pain response Dependence Constipation Depression Respiratory depression And more.  Withdrawal symptoms can include Flu like symptoms Nausea, vomiting And more Techniques to manage these symptoms  Hydrate well Eat regular healthy meals Stay active Use relaxation techniques(deep breathing, meditating, yoga) Do Not substitute Alcohol to help with tapering If you have been on opioids for less than two weeks and do not have pain than  it is ok to stop all together.  Plan to wean off of opioids This plan should start within one week post op of your joint replacement. Maintain the same interval or time between taking each dose and first decrease the dose.  Cut the total daily intake of opioids by one tablet each day Next start to increase the time between doses. The last dose that should be eliminated is the evening dose.        Allergies as of 05/19/2020      Reactions   Codeine Itching, Swelling   swelling in throat   Pravastatin Other (See Comments)   cramps   Latex Rash      Medication List    STOP taking these medications   aspirin EC 81 MG tablet     TAKE these medications   Accu-Chek Softclix Lancets lancets Use to check blood glucose up to three times a day. Dx code E 11.9   acetaminophen 500 MG tablet Commonly known as: TYLENOL Take 500-1,000 mg by mouth every 6 (six) hours as needed for moderate pain or headache.   atorvastatin 40 MG tablet Commonly known as: LIPITOR TAKE ONE TABLET BY MOUTH DAILY   BAG BALM EX Apply 1 application topically daily.   Biotin 1000 MCG tablet Take 1,000 mcg by mouth daily.   brimonidine-timolol 0.2-0.5 % ophthalmic solution Commonly known as: Combigan Place 1 drop into both eyes every 12 (twelve) hours. Place one drop to both eyes twice daily for glaucoma What changed: additional instructions   CALCIUM 600+D PO Take 1 tablet by mouth daily.   calcium carbonate 500 MG chewable tablet Commonly known as: TUMS - dosed in mg elemental calcium Chew 500-1,000 mg by mouth daily as needed for indigestion or heartburn.   COLLAGEN PO Take 1-2 tablets by mouth See admin instructions. Take 2 tablets in the morning and 1 tablet in the evening   cycloSPORINE 0.05 % ophthalmic emulsion Commonly known as: RESTASIS Place 1 drop into both eyes 2 (two) times daily.   docusate sodium 100 MG capsule Commonly known as: Colace Take 1 capsule (100 mg total) by mouth 2  (two) times daily. While taking narcotic pain medicine.   ferrous sulfate 325 (65 FE) MG tablet Take 1 tablet (325 mg total) by mouth daily with breakfast.   glucose blood test strip Commonly known as: Accu-Chek Aviva Use to check blood glucose up to three times a day. Dx code E 11.9   [name] Generic drug: SitaGLIPtin-MetFORMIN HCl TAKE ONE TABLET BY MOUTH TWICE A DAY   lisinopril 10 MG tablet Commonly known as: ZESTRIL TAKE ONE TABLET BY MOUTH DAILY   multivitamin tablet Take 1 tablet by mouth daily.   oxyCODONE 5 MG immediate release tablet Commonly known as: Roxicodone Take 1 tablet (5 mg total) by mouth every 4 (four) hours as needed for moderate pain or severe pain.   pantoprazole 20 MG tablet Commonly known as: PROTONIX TAKE ONE TABLET BY MOUTH TWICE A DAY   REFRESH OP Place 1 drop into both eyes daily as needed (dry eyes).   rivaroxaban 10 MG Tabs tablet Commonly known as: Xarelto Take 1 tablet (10 mg total) by mouth daily.   Rocklatan 0.02-0.005 % Soln Generic  drug: Netarsudil-Latanoprost Place 1 drop into both eyes daily at 10 pm.   senna 8.6 MG Tabs tablet Commonly known as: SENOKOT Take 2 tablets (17.2 mg total) by mouth 2 (two) times daily.   vitamin A 3 MG (10000 UNITS) capsule Take 10,000 Units by mouth daily.   vitamin B-12 500 MCG tablet Commonly known as: CYANOCOBALAMIN Take 500 mcg by mouth daily.   vitamin C 500 MG tablet Commonly known as: ASCORBIC ACID Take 500 mg by mouth daily.   Vitamin D 50 MCG (2000 UT) tablet Take 2,000 Units by mouth daily.   Zinc 50 MG Caps Take 50 mg by mouth daily.            Discharge Care Instructions  (From admission, onward)         Start     Ordered   05/19/20 0000  Non weight bearing       Question Answer Comment  Laterality right   Extremity Lower      05/19/20 1305          Contact information for follow-up providers    Toni Arthurs, MD. Schedule an appointment  as soon as possible for a visit in 2 weeks.   Specialty: Orthopedic Surgery Contact information: [address] [place] [address] [place] [address] [phone]            Contact information for after-discharge care    Destination    [facility] Preferred SNF .   Service: Skilled Nursing Contact information: [address] [place] [address] [phone]                  Signed: Lolly Mustache Office:  [phone]

## 2020-05-19 NOTE — Progress Notes (Signed)
[facility] here at 11pm to pick patient up to SNF, all questions answered, patient has no concerns, VSS, will continue to monitor.

## 2020-05-19 NOTE — Progress Notes (Signed)
PROGRESS NOTE    TAMIIA LEBECK  JIR:[id number] DOB: 1955-12-03 DOA: 05/14/2020 PCP: Chevis Pretty, MD    Brief Narrative:  Reason for Consult:Assistance with postoperative management of multiple chronic medicalproblems, including that oftype 2 diabetes mellitus andstage IIIa chronic kidney disease.  BPZ:WCHENIDP [name] a 65 y.o.femalewith medical history significant fortype 2 diabetes mellitus with most recent hemoglobin A1c 6.3% in February 2022 and complicated by right Charcotneuroarthropathy,stage IIIa chronic kidney disease with baseline creatinine 1.2-1.5, hyperlipidemia, hypertension, chronic normocytic anemia with baseline hemoglobin 9-11, who is admitted to [facility] on4/28/2022by the orthopedic surgery serviceforsurgical treatment of right Charcot neuropathy.On 5/1-Hg 6.2- getting 2 units PRBC, hg remains stable.  TRH was consulted for renal dz and diabetes.     Impression/Recommendations Principal Problem:   Charcot's joint, right ankle and foot Active Problems:   Diabetes mellitus type II, controlled (HCC)   Essential hypertension   Normocytic anemia   GERD (gastroesophageal reflux disease)   CKD (chronic kidney disease) stage 3, GFR 30-59 ml/min (HCC)   Hyperlipidemia  #Acute post op blood loss- Hg 6.2.  Status post 2 units of blood on 05/17/2021 5/3-Hg remained stable We will check periodically      #) Type 2 diabetes mellitus: Appears to be well controlled as an outpatient, hemoglobin A1c noted to be 6.3% in February 2022.  Not on any insulin as an outpatient,but rather is on Janumet as her sole outpatient oral hypoglycemic agent. 5/3 stable  Continue carb controlled diet  R ISS       #) Stage IIIa chronic kidney disease: Associated with baseline creatinine of 1.2-1.5. Renal function remained stable  Avoid nephrotoxic meds  Monitor periodically      #Hypomagnesimia Replaced with IV magnesium   Continue magnesium 800 daily     #) Hyperlipidemia:continue statin    #) Essential hypertension:hypotensive now. Responded to fluid. Continue to hold BP meds and monitor    #) Chronic normocytic anemia: Given the history of such, with baseline hemoglobin noted to be 9-11. See above If Hg <7 should transfuse. Continue fes04.  #) GERD:on ppi   #)rightCharcot neuroarthropathy:mx per primary team  Subjective: Feels well.  No complaints of shortness of breath, chest pain, dizziness  Objective: Vitals:   05/18/20 0735 05/18/20 1500 05/18/20 2123 05/19/20 0449  BP: (!) 136/55 (!) 146/60 (!) 145/64 134/63  Pulse: 78 79 83 79  Resp: 17 18 18    Temp: 98.3 F (36.8 C) 98.8 F (37.1 C) 98.9 F (37.2 C) 98.7 F (37.1 C)  TempSrc: Oral Oral Oral Oral  SpO2: 98% 99% 98% 98%  Weight:      Height:        Intake/Output Summary (Last 24 hours) at 05/19/2020 1431 Last data filed at 05/19/2020 0900 Gross per 24 hour  Intake 529.42 ml  Output --  Net 529.42 ml   Filed Weights   05/14/20 0546 05/14/20 0713  Weight: 112 kg 112 kg    Examination: Calm, NAD CTA no wheezing Regular S1-S2 no gallops Soft positive bowel sounds No edema of the left lower extremity right lower extremity wrapped Alert oriented x3   Data Reviewed: I have personally reviewed following labs and imaging studies  CBC: Recent Labs  Lab 05/14/20 0606 05/15/20 0011 05/15/20 1412 05/17/20 0047 05/17/20 1335 05/18/20 0336  WBC 8.2 10.7* 9.7 9.3  --  9.4  HGB 10.9* 8.2* 7.2* 6.2* 8.8* 8.5*  HCT 34.1* 24.4* 22.5* 18.9* 26.0* 26.0*  MCV 89.0 87.5 90.4 87.5  --  86.7  PLT 208 165 147* 138*  --  180   Basic Metabolic Panel: Recent Labs  Lab 05/14/20 0606 05/15/20 0011 05/17/20 0047 05/18/20 0336 05/19/20 0501  NA 136 135 134*  --   --   K 4.2 4.1 4.1  --   --   CL 103 103 105  --   --   CO2 --   --   GLUCOSE 113* 130* 127*  --   --   BUN 28* 24* 17  --   --    CREATININE 1.44* 1.38* 1.33*  --   --   CALCIUM 9.6 8.3* 8.2*  --   --   MG  --  1.5*  --  1.6* 1.7   GFR: Estimated Creatinine Clearance: 56.1 mL/min (A) (by C-G formula based on SCr of 1.33 mg/dL (H)). Liver Function Tests: No results for input(s): AST, ALT, ALKPHOS, BILITOT, PROT, ALBUMIN in the last 168 hours. No results for input(s): LIPASE, AMYLASE in the last 168 hours. No results for input(s): AMMONIA in the last 168 hours. Coagulation Profile: No results for input(s): INR, PROTIME in the last 168 hours. Cardiac Enzymes: No results for input(s): CKTOTAL, CKMB, CKMBINDEX, TROPONINI in the last 168 hours. BNP (last 3 results) No results for input(s): PROBNP in the last 8760 hours. HbA1C: No results for input(s): HGBA1C in the last 72 hours. CBG: Recent Labs  Lab 05/18/20 1143 05/18/20 1520 05/18/20 2125 05/19/20 0647 05/19/20 1130  GLUCAP 201* 146* 142* 148* 131*   Lipid Profile: No results for input(s): CHOL, HDL, LDLCALC, TRIG, CHOLHDL, LDLDIRECT in the last 72 hours. Thyroid Function Tests: No results for input(s): TSH, T4TOTAL, FREET4, T3FREE, THYROIDAB in the last 72 hours. Anemia Panel: No results for input(s): VITAMINB12, FOLATE, FERRITIN, TIBC, IRON, RETICCTPCT in the last 72 hours. Sepsis Labs: No results for input(s): PROCALCITON, LATICACIDVEN in the last 168 hours.  Recent Results (from the past 240 hour(s))  SARS CORONAVIRUS 2 (TAT 6-24 HRS) Nasopharyngeal Nasopharyngeal Swab     Status: None   Collection Time: 05/12/20 11:17 AM   Specimen: Nasopharyngeal Swab  Result Value Ref Range Status   SARS Coronavirus 2 NEGATIVE NEGATIVE Final    Comment: (NOTE) SARS-CoV-2 target nucleic acids are NOT DETECTED.  The SARS-CoV-2 RNA is generally detectable in upper and lower respiratory specimens during the acute phase of infection. Negative results do not preclude SARS-CoV-2 infection, do not rule out co-infections with other pathogens, and should not be  used as the sole basis for treatment or other patient management decisions. Negative results must be combined with clinical observations, patient history, and epidemiological information. The expected result is Negative.  Fact Sheet for Patients: HairSlick.no  Fact Sheet for Healthcare Providers: quierodirigir.com  This test is not yet approved or cleared by the [place] FDA and  has been authorized for detection and/or diagnosis of SARS-CoV-2 by FDA under an Emergency Use Authorization (EUA). This EUA will remain  in effect (meaning this test can be used) for the duration of the COVID-19 declaration under Se ction 564(b)(1) of the Act, 21 U.S.C. section 360bbb-3(b)(1), unless the authorization is terminated or revoked sooner.  Performed at [facility], [address], [place], [place] [address]   Surgical PCR screen     Status: Abnormal   Collection Time: 05/14/20  7:38 AM   Specimen: Nasal Mucosa; Nasal Swab  Result Value Ref Range Status   MRSA, PCR NEGATIVE NEGATIVE Final  Staphylococcus aureus POSITIVE (A) NEGATIVE Final    Comment: (NOTE) The Xpert SA Assay (FDA approved for NASAL specimens in patients 31 years of age and older), is one component of a comprehensive surveillance program. It is not intended to diagnose infection nor to guide or monitor treatment. Performed at [facility], [address], [place], [place] [address]   Resp Panel by RT-PCR (Flu A&B, Covid) Nasopharyngeal Swab     Status: None   Collection Time: 05/19/20 11:30 AM   Specimen: Nasopharyngeal Swab; Nasopharyngeal(NP) swabs in vial transport medium  Result Value Ref Range Status   SARS Coronavirus 2 by RT PCR NEGATIVE NEGATIVE Final    Comment: (NOTE) SARS-CoV-2 target nucleic acids are NOT DETECTED.  The SARS-CoV-2 RNA is generally detectable in upper respiratory specimens during the acute phase of infection. The  lowest concentration of SARS-CoV-2 viral copies this assay can detect is 138 copies/mL. A negative result does not preclude SARS-Cov-2 infection and should not be used as the sole basis for treatment or other patient management decisions. A negative result may occur with  improper specimen collection/handling, submission of specimen other than nasopharyngeal swab, presence of viral mutation(s) within the areas targeted by this assay, and inadequate number of viral copies(<138 copies/mL). A negative result must be combined with clinical observations, patient history, and epidemiological information. The expected result is Negative.  Fact Sheet for Patients:  BloggerCourse.com  Fact Sheet for Healthcare Providers:  SeriousBroker.it  This test is no t yet approved or cleared by the [place] FDA and  has been authorized for detection and/or diagnosis of SARS-CoV-2 by FDA under an Emergency Use Authorization (EUA). This EUA will remain  in effect (meaning this test can be used) for the duration of the COVID-19 declaration under Section 564(b)(1) of the Act, 21 U.S.C.section 360bbb-3(b)(1), unless the authorization is terminated  or revoked sooner.       Influenza A by PCR NEGATIVE NEGATIVE Final   Influenza B by PCR NEGATIVE NEGATIVE Final    Comment: (NOTE) The Xpert Xpress SARS-CoV-2/FLU/RSV plus assay is intended as an aid in the diagnosis of influenza from Nasopharyngeal swab specimens and should not be used as a sole basis for treatment. Nasal washings and aspirates are unacceptable for Xpert Xpress SARS-CoV-2/FLU/RSV testing.  Fact Sheet for Patients: BloggerCourse.com  Fact Sheet for Healthcare Providers: SeriousBroker.it  This test is not yet approved or cleared by the [place] FDA and has been authorized for detection and/or diagnosis of SARS-CoV-2 by FDA under  an Emergency Use Authorization (EUA). This EUA will remain in effect (meaning this test can be used) for the duration of the COVID-19 declaration under Section 564(b)(1) of the Act, 21 U.S.C. section 360bbb-3(b)(1), unless the authorization is terminated or revoked.  Performed at [facility], [address], [place], [place] [address]          Radiology Studies: No results found.      Scheduled Meds: . atorvastatin  40 mg Oral Daily  . brimonidine  1 drop Both Eyes Q12H   And  . timolol  1 drop Both Eyes BID  . cholecalciferol  2,000 Units Oral Daily  . cycloSPORINE  1 drop Both Eyes BID  . docusate sodium  100 mg Oral BID  . enoxaparin (LOVENOX) injection  40 mg Subcutaneous Q24H  . ferrous sulfate  325 mg Oral Q breakfast  . insulin aspart  0-9 Units Subcutaneous TID WC  . magnesium oxide  800 mg  Oral Daily  . Netarsudil-Latanoprost  1 drop Both Eyes Q2200  . pantoprazole  20 mg Oral BID  . senna  1 tablet Oral BID  . vitamin B-12  500 mcg Oral Daily   Continuous Infusions: . sodium chloride 75 mL/hr at 05/16/20 0335  . methocarbamol (ROBAXIN) IV               LOS: 5 days   Time spent: 35 minutes    Lynn Ito, MD [facility] Pager [phone]  If 7PM-7AM, please contact night-coverage 05/19/2020, 2:31 PM

## 2020-06-08 ENCOUNTER — Telehealth: Payer: Self-pay

## 2020-06-08 NOTE — Telephone Encounter (Signed)
Received TC from [facility], RN w/ [facility] (formaly [facility]) who states Start of Care was yesterday and she is now requesting VO for Skilled Nursing Care R/T diease management teaching: 2X/week for 2 weeks 1X/week for 2 weeks 1X/month for 1 month And 2 PRN visits VO given  Will route to [facility] team for agreement or denial. Thank you, SChaplin, RN,BSN

## 2020-06-08 NOTE — Telephone Encounter (Signed)
Agree with VO for home health, thank you.

## 2020-06-15 ENCOUNTER — Encounter: Payer: Self-pay | Admitting: *Deleted

## 2020-07-07 ENCOUNTER — Other Ambulatory Visit: Payer: Self-pay | Admitting: Internal Medicine

## 2020-07-07 DIAGNOSIS — E1169 Type 2 diabetes mellitus with other specified complication: Secondary | ICD-10-CM

## 2020-07-15 ENCOUNTER — Other Ambulatory Visit: Payer: Self-pay

## 2020-07-15 DIAGNOSIS — I10 Essential (primary) hypertension: Secondary | ICD-10-CM

## 2020-07-15 MED ORDER — LISINOPRIL 10 MG PO TABS
10.0000 mg | ORAL_TABLET | Freq: Every day | ORAL | 1 refills | Status: DC
Start: 2020-07-15 — End: 2021-01-13

## 2020-07-15 NOTE — Telephone Encounter (Signed)
TC to [facility] and spoke with pharmacist.  Informed patient receives Xarelto from [facility], he states he will sent the Xarelto refill request to [facility].  Pharmacist states he has refills available for the Protonix and will get this ready for the patient. Lisinopril refills are out, will sent refill request to team.  Patient saw Dr. Karilyn Cota last on 04/23/20. SChaplin, RN,BSN

## 2020-07-15 NOTE — Telephone Encounter (Signed)
Pt is calling back with the names to the medication she is requesting to be filled pantoprazole (PROTONIX) 20 MG tablet ,  lisinopril (ZESTRIL) 10 MG tablet,rivaroxaban (XARELTO) 10 MG TABS tablet sent to  [facility] [id number] - [name], [place] - [address] Phone:  [phone]  Fax:  [phone]

## 2020-07-15 NOTE — Telephone Encounter (Signed)
Per patient the pharmacy faxed refilled request to the clinic, they're waiting to hear back from the clinic. Pt states she do not know the name of the meds. Please call back.

## 2020-07-30 ENCOUNTER — Telehealth: Payer: Self-pay

## 2020-07-30 NOTE — Telephone Encounter (Signed)
Lmom informing patient of 2 mobile mammogram events that will be in her area on 09/09/2020 and 10/07/2020. Patient has been asked to call back if she would like to be scheduled for one of these events.

## 2020-08-04 ENCOUNTER — Telehealth: Payer: Self-pay | Admitting: *Deleted

## 2020-08-04 NOTE — Telephone Encounter (Signed)
Agree with order. Thank you!

## 2020-08-04 NOTE — Telephone Encounter (Signed)
Call from [facility], [facility] - stated re-certification completed today. Requesting verbal order "Pt once a week x 9 weeks for balance, strengthening,ambulation". VO given - if not appropriate, let me know. Thanks

## 2020-08-17 ENCOUNTER — Other Ambulatory Visit: Payer: Self-pay | Admitting: Internal Medicine

## 2020-08-17 DIAGNOSIS — Z1231 Encounter for screening mammogram for malignant neoplasm of breast: Secondary | ICD-10-CM

## 2020-10-06 ENCOUNTER — Other Ambulatory Visit: Payer: Self-pay | Admitting: Internal Medicine

## 2020-10-06 DIAGNOSIS — E1169 Type 2 diabetes mellitus with other specified complication: Secondary | ICD-10-CM

## 2020-11-24 ENCOUNTER — Encounter: Payer: Self-pay | Admitting: *Deleted

## 2020-11-24 NOTE — Progress Notes (Unsigned)

## 2020-12-07 ENCOUNTER — Other Ambulatory Visit (HOSPITAL_COMMUNITY): Payer: Self-pay | Admitting: Orthopedic Surgery

## 2020-12-15 ENCOUNTER — Other Ambulatory Visit: Payer: Self-pay

## 2020-12-15 ENCOUNTER — Encounter (HOSPITAL_COMMUNITY): Payer: Self-pay | Admitting: Orthopedic Surgery

## 2020-12-15 ENCOUNTER — Other Ambulatory Visit (HOSPITAL_COMMUNITY)
Admission: RE | Admit: 2020-12-15 | Discharge: 2020-12-15 | Disposition: A | Discharge status: Home / Self Care | Payer: Medicare Other | Source: Ambulatory Visit | Attending: Orthopedic Surgery | Admitting: Orthopedic Surgery

## 2020-12-15 DIAGNOSIS — Z01812 Encounter for preprocedural laboratory examination: Secondary | ICD-10-CM | POA: Insufficient documentation

## 2020-12-15 DIAGNOSIS — Z20822 Contact with and (suspected) exposure to covid-19: Secondary | ICD-10-CM | POA: Insufficient documentation

## 2020-12-15 DIAGNOSIS — Z01818 Encounter for other preprocedural examination: Secondary | ICD-10-CM

## 2020-12-15 NOTE — Progress Notes (Addendum)
PCP - Allena Katz  Cardiologist - denies EKG - 04/23/20 Chest x-ray -  ECHO -  Cardiac Cath -  CPAP -   Fasting Blood Sugar:  120-127 Checks Blood Sugar:  1x/day  Blood Thinner Instructions:  Aspirin Instructions: Follow your surgeon's instructions on when to stop Aspirin.  If no instructions were given by your surgeon then you will need to call the office to get those instructions.    ERAS Protcol - 0930  COVID TEST- 12/15/20  Anesthesia review: n/a  -------------  SDW INSTRUCTIONS:  Your procedure is scheduled on Thursday 12/1. Please report to [facility] Main Entrance "A" at 1000 A.M., and check in at the Admitting office. Call this number if you have problems the morning of surgery: [phone]   Remember: Do not eat after midnight the night before your surgery  You may drink clear liquids until 0930 AM the morning of your surgery.   Clear liquids allowed are: Water, Non-Citrus Juices (without pulp), Carbonated Beverages, Clear Tea, Black Coffee Only, and Gatorade   Medications to take morning of surgery with a sip of water include: Tylenol - if needed atorvastatin (LIPITOR)  doxycycline (VIBRA-TABS)  methocarbamol (ROBAXIN) - if needed  As of today, STOP taking any Aspirin (unless otherwise instructed by your surgeon), Aleve, Naproxen, Ibuprofen, Motrin, Advil, Goody's, BC's, all herbal medications, fish oil, and all vitamins.  ** PLEASE check your blood sugar the morning of your surgery when you wake up and every 2 hours until you get to the Short Stay unit.  If your blood sugar is less than 70 mg/dL, you will need to treat for low blood sugar: Do not take insulin. Treat a low blood sugar (less than 70 mg/dL) with  cup of clear juice (cranberry or apple), 4 glucose tablets, OR glucose gel. Recheck blood sugar in 15 minutes after treatment (to make sure it is greater than 70 mg/dL). If your blood sugar is not greater than 70 mg/dL on recheck, call [phone] for further  instructions. NO JANUMET day BEFORE and day of surgery   The Morning of Surgery Do not wear jewelry, make-up or nail polish. Do not wear lotions, powders, or perfumes or deodorant  Do not bring valuables to the hospital. [facility] is not responsible for any belongings or valuables.  If you are a smoker, DO NOT Smoke 24 hours prior to surgery  If you wear a CPAP at night please bring your mask the morning of surgery   Remember that you must have someone to transport you home after your surgery, and remain with you for 24 hours if you are discharged the same day.  Please bring cases for contacts, glasses, hearing aids, dentures or bridgework because it cannot be worn into surgery.   Patients discharged the day of surgery will not be allowed to drive home.   Please shower the NIGHT BEFORE/MORNING OF SURGERY (use antibacterial soap like DIAL soap if possible). Wear comfortable clothes the morning of surgery. Oral Hygiene is also important to reduce your risk of infection.  Remember - BRUSH YOUR TEETH THE MORNING OF SURGERY WITH YOUR REGULAR TOOTHPASTE  Patient denies shortness of breath, fever, cough and chest pain.

## 2020-12-16 LAB — SARS CORONAVIRUS 2 (TAT 6-24 HRS): SARS Coronavirus 2: NEGATIVE

## 2020-12-16 NOTE — Anesthesia Preprocedure Evaluation (Addendum)
Anesthesia Evaluation  Patient identified by MRN, date of birth, ID band Patient awake    Reviewed: Allergy & Precautions, NPO status , Patient's Chart, lab work & pertinent test results  Airway Mallampati: I  TM Distance: >3 FB Neck ROM: Full    Dental no notable dental hx. (+) Missing,    Pulmonary sleep apnea and Continuous Positive Airway Pressure Ventilation , former smoker,    Pulmonary exam normal breath sounds clear to auscultation       Cardiovascular hypertension, Normal cardiovascular exam Rhythm:Regular Rate:Normal     Neuro/Psych    GI/Hepatic hiatal hernia, GERD  ,  Endo/Other  diabetes, Type 2, Oral Hypoglycemic AgentsMorbid obesity (BMI 39.19)  Renal/GU Renal InsufficiencyRenal disease     Musculoskeletal  (+) Arthritis ,   Abdominal   Peds  Hematology            Anesthesia Other Findings ALL: Latex, Codeine, Pravastatin   Reproductive/Obstetrics                            Anesthesia Physical Anesthesia Plan  ASA: 3  Anesthesia Plan: General   Post-op Pain Management: Dilaudid IV and Tylenol PO (pre-op)   Induction: Intravenous  PONV Risk Score and Plan: 4 or greater and Treatment may vary due to age or medical condition, Midazolam and Ondansetron  Airway Management Planned: LMA  Additional Equipment: None  Intra-op Plan:   Post-operative Plan:   Informed Consent: I have reviewed the patients History and Physical, chart, labs and discussed the procedure including the risks, benefits and alternatives for the proposed anesthesia with the patient or authorized representative who has indicated his/her understanding and acceptance.     Dental advisory given  Plan Discussed with: CRNA and Anesthesiologist  Anesthesia Plan Comments:        Anesthesia Quick Evaluation

## 2020-12-17 ENCOUNTER — Inpatient Hospital Stay (HOSPITAL_COMMUNITY)
Admission: AD | Admit: 2020-12-17 | Discharge: 2020-12-20 | DRG: 496 | Disposition: A | Discharge status: Home Health | Payer: Medicare Other | Attending: Orthopedic Surgery | Admitting: Orthopedic Surgery

## 2020-12-17 ENCOUNTER — Encounter (HOSPITAL_COMMUNITY): Payer: Self-pay | Admitting: Orthopedic Surgery

## 2020-12-17 ENCOUNTER — Encounter (HOSPITAL_COMMUNITY)
Admission: AD | Disposition: A | Discharge status: Home / Self Care | Payer: Self-pay | Source: Ambulatory Visit | Attending: Orthopedic Surgery

## 2020-12-17 ENCOUNTER — Ambulatory Visit (HOSPITAL_COMMUNITY): Payer: Medicare Other | Admitting: Anesthesiology

## 2020-12-17 ENCOUNTER — Ambulatory Visit (HOSPITAL_COMMUNITY): Payer: Medicare Other

## 2020-12-17 ENCOUNTER — Other Ambulatory Visit: Payer: Self-pay

## 2020-12-17 DIAGNOSIS — E118 Type 2 diabetes mellitus with unspecified complications: Secondary | ICD-10-CM

## 2020-12-17 DIAGNOSIS — E11621 Type 2 diabetes mellitus with foot ulcer: Secondary | ICD-10-CM | POA: Diagnosis present

## 2020-12-17 DIAGNOSIS — Z885 Allergy status to narcotic agent status: Secondary | ICD-10-CM

## 2020-12-17 DIAGNOSIS — E11610 Type 2 diabetes mellitus with diabetic neuropathic arthropathy: Secondary | ICD-10-CM | POA: Diagnosis present

## 2020-12-17 DIAGNOSIS — Z8249 Family history of ischemic heart disease and other diseases of the circulatory system: Secondary | ICD-10-CM

## 2020-12-17 DIAGNOSIS — K219 Gastro-esophageal reflux disease without esophagitis: Secondary | ICD-10-CM | POA: Diagnosis present

## 2020-12-17 DIAGNOSIS — E1122 Type 2 diabetes mellitus with diabetic chronic kidney disease: Secondary | ICD-10-CM | POA: Diagnosis present

## 2020-12-17 DIAGNOSIS — M869 Osteomyelitis, unspecified: Secondary | ICD-10-CM | POA: Diagnosis present

## 2020-12-17 DIAGNOSIS — Z91040 Latex allergy status: Secondary | ICD-10-CM

## 2020-12-17 DIAGNOSIS — E785 Hyperlipidemia, unspecified: Secondary | ICD-10-CM | POA: Diagnosis present

## 2020-12-17 DIAGNOSIS — D631 Anemia in chronic kidney disease: Secondary | ICD-10-CM | POA: Diagnosis present

## 2020-12-17 DIAGNOSIS — E119 Type 2 diabetes mellitus without complications: Secondary | ICD-10-CM

## 2020-12-17 DIAGNOSIS — Z833 Family history of diabetes mellitus: Secondary | ICD-10-CM

## 2020-12-17 DIAGNOSIS — Z888 Allergy status to other drugs, medicaments and biological substances status: Secondary | ICD-10-CM

## 2020-12-17 DIAGNOSIS — Z6839 Body mass index (BMI) 39.0-39.9, adult: Secondary | ICD-10-CM

## 2020-12-17 DIAGNOSIS — Z7984 Long term (current) use of oral hypoglycemic drugs: Secondary | ICD-10-CM

## 2020-12-17 DIAGNOSIS — T8484XA Pain due to internal orthopedic prosthetic devices, implants and grafts, initial encounter: Principal | ICD-10-CM | POA: Diagnosis present

## 2020-12-17 DIAGNOSIS — D649 Anemia, unspecified: Secondary | ICD-10-CM | POA: Diagnosis present

## 2020-12-17 DIAGNOSIS — Z981 Arthrodesis status: Secondary | ICD-10-CM

## 2020-12-17 DIAGNOSIS — I129 Hypertensive chronic kidney disease with stage 1 through stage 4 chronic kidney disease, or unspecified chronic kidney disease: Secondary | ICD-10-CM | POA: Diagnosis present

## 2020-12-17 DIAGNOSIS — H409 Unspecified glaucoma: Secondary | ICD-10-CM | POA: Diagnosis present

## 2020-12-17 DIAGNOSIS — L97419 Non-pressure chronic ulcer of right heel and midfoot with unspecified severity: Secondary | ICD-10-CM | POA: Diagnosis present

## 2020-12-17 DIAGNOSIS — L02611 Cutaneous abscess of right foot: Secondary | ICD-10-CM | POA: Diagnosis present

## 2020-12-17 DIAGNOSIS — E1169 Type 2 diabetes mellitus with other specified complication: Secondary | ICD-10-CM | POA: Diagnosis present

## 2020-12-17 DIAGNOSIS — N1831 Chronic kidney disease, stage 3a: Secondary | ICD-10-CM | POA: Diagnosis present

## 2020-12-17 DIAGNOSIS — Z7982 Long term (current) use of aspirin: Secondary | ICD-10-CM

## 2020-12-17 DIAGNOSIS — Z79899 Other long term (current) drug therapy: Secondary | ICD-10-CM

## 2020-12-17 DIAGNOSIS — Z20822 Contact with and (suspected) exposure to covid-19: Secondary | ICD-10-CM | POA: Diagnosis present

## 2020-12-17 DIAGNOSIS — N183 Chronic kidney disease, stage 3 unspecified: Secondary | ICD-10-CM | POA: Diagnosis present

## 2020-12-17 DIAGNOSIS — E669 Obesity, unspecified: Secondary | ICD-10-CM | POA: Diagnosis present

## 2020-12-17 DIAGNOSIS — Z87891 Personal history of nicotine dependence: Secondary | ICD-10-CM

## 2020-12-17 DIAGNOSIS — Y831 Surgical operation with implant of artificial internal device as the cause of abnormal reaction of the patient, or of later complication, without mention of misadventure at the time of the procedure: Secondary | ICD-10-CM | POA: Diagnosis present

## 2020-12-17 DIAGNOSIS — G4733 Obstructive sleep apnea (adult) (pediatric): Secondary | ICD-10-CM | POA: Diagnosis present

## 2020-12-17 DIAGNOSIS — I10 Essential (primary) hypertension: Secondary | ICD-10-CM | POA: Diagnosis present

## 2020-12-17 HISTORY — PX: HARDWARE REMOVAL: SHX979

## 2020-12-17 LAB — BASIC METABOLIC PANEL
Anion gap: 8 (ref 5–15)
BUN: 24 mg/dL — ABNORMAL HIGH (ref 8–23)
CO2: 25 mmol/L (ref 22–32)
Calcium: 9.2 mg/dL (ref 8.9–10.3)
Chloride: 105 mmol/L (ref 98–111)
Creatinine, Ser: 1.36 mg/dL — ABNORMAL HIGH (ref 0.44–1.00)
GFR, Estimated: 43 mL/min — ABNORMAL LOW (ref >60)
Glucose, Bld: 104 mg/dL — ABNORMAL HIGH (ref 70–99)
Potassium: 4.1 mmol/L (ref 3.5–5.1)
Sodium: 138 mmol/L (ref 135–145)

## 2020-12-17 LAB — HEMOGLOBIN A1C
Hgb A1c MFr Bld: 6.8 % — ABNORMAL HIGH (ref 4.8–5.6)
Mean Plasma Glucose: 148.46 mg/dL

## 2020-12-17 LAB — CBC
HCT: 32.7 % — ABNORMAL LOW (ref 36.0–46.0)
Hemoglobin: 10.2 g/dL — ABNORMAL LOW (ref 12.0–15.0)
MCH: 26.8 pg (ref 26.0–34.0)
MCHC: 31.2 g/dL (ref 30.0–36.0)
MCV: 86.1 fL (ref 80.0–100.0)
Platelets: 269 10*3/uL (ref 150–400)
RBC: 3.8 MIL/uL — ABNORMAL LOW (ref 3.87–5.11)
RDW: 15.8 % — ABNORMAL HIGH (ref 11.5–15.5)
WBC: 7.5 10*3/uL (ref 4.0–10.5)
nRBC: 0 % (ref 0.0–0.2)

## 2020-12-17 LAB — GLUCOSE, CAPILLARY
Glucose-Capillary: 101 mg/dL — ABNORMAL HIGH (ref 70–99)
Glucose-Capillary: 93 mg/dL (ref 70–99)
Glucose-Capillary: 90 mg/dL (ref 70–99)
Glucose-Capillary: 150 mg/dL — ABNORMAL HIGH (ref 70–99)
Glucose-Capillary: 162 mg/dL — ABNORMAL HIGH (ref 70–99)

## 2020-12-17 LAB — C-REACTIVE PROTEIN: CRP: 1.2 mg/dL — ABNORMAL HIGH (ref <1.0)

## 2020-12-17 LAB — SEDIMENTATION RATE: Sed Rate: 68 mm/hr — ABNORMAL HIGH (ref 0–22)

## 2020-12-17 SURGERY — REMOVAL, HARDWARE
Anesthesia: General | Laterality: Right

## 2020-12-17 MED ORDER — LINAGLIPTIN 5 MG PO TABS
5.0000 mg | ORAL_TABLET | Freq: Every day | ORAL | Status: DC
  Administered 2020-12-17 – 2020-12-20 (×4): 5 mg via ORAL
  Filled 2020-12-17 (×4): qty 1

## 2020-12-17 MED ORDER — OXYCODONE HCL 5 MG PO TABS
5.0000 mg | ORAL_TABLET | ORAL | Status: DC | PRN
  Administered 2020-12-18 (×2): 10 mg via ORAL
  Filled 2020-12-17 (×2): qty 2

## 2020-12-17 MED ORDER — DEXAMETHASONE SODIUM PHOSPHATE 10 MG/ML IJ SOLN
INTRAMUSCULAR | Status: DC | PRN
  Administered 2020-12-17: 4 mg via INTRAVENOUS

## 2020-12-17 MED ORDER — LISINOPRIL 10 MG PO TABS
10.0000 mg | ORAL_TABLET | Freq: Every day | ORAL | Status: DC
  Administered 2020-12-17 – 2020-12-20 (×4): 10 mg via ORAL
  Filled 2020-12-17 (×5): qty 1

## 2020-12-17 MED ORDER — AMISULPRIDE (ANTIEMETIC) 5 MG/2ML IV SOLN: 10.0000 mg | Freq: Once | INTRAVENOUS | Status: DC | PRN

## 2020-12-17 MED ORDER — METHOCARBAMOL 1000 MG/10ML IJ SOLN
500.0000 mg | Freq: Four times a day (QID) | INTRAVENOUS | Status: DC | PRN
  Filled 2020-12-17: qty 5

## 2020-12-17 MED ORDER — FENTANYL CITRATE (PF) 250 MCG/5ML IJ SOLN
INTRAMUSCULAR | Status: AC
  Filled 2020-12-17: qty 5

## 2020-12-17 MED ORDER — SITAGLIP PHOS-METFORMIN HCL ER 50-500 MG PO TB24: 1.0000 | ORAL_TABLET | Freq: Two times a day (BID) | ORAL | Status: DC

## 2020-12-17 MED ORDER — MIDAZOLAM HCL 2 MG/2ML IJ SOLN
INTRAMUSCULAR | Status: AC
  Filled 2020-12-17: qty 2

## 2020-12-17 MED ORDER — ONDANSETRON HCL 4 MG/2ML IJ SOLN
INTRAMUSCULAR | Status: DC | PRN
  Administered 2020-12-17: 4 mg via INTRAVENOUS

## 2020-12-17 MED ORDER — ONDANSETRON HCL 4 MG/2ML IJ SOLN: 4.0000 mg | Freq: Four times a day (QID) | INTRAMUSCULAR | Status: DC | PRN

## 2020-12-17 MED ORDER — SENNA 8.6 MG PO TABS
1.0000 | ORAL_TABLET | Freq: Two times a day (BID) | ORAL | Status: DC
  Administered 2020-12-17 – 2020-12-20 (×6): 8.6 mg via ORAL
  Filled 2020-12-17 (×6): qty 1

## 2020-12-17 MED ORDER — METOCLOPRAMIDE HCL 5 MG PO TABS: 5.0000 mg | ORAL_TABLET | Freq: Three times a day (TID) | ORAL | Status: DC | PRN

## 2020-12-17 MED ORDER — PIPERACILLIN-TAZOBACTAM 3.375 G IVPB 30 MIN
3.3750 g | INTRAVENOUS | Status: AC
  Administered 2020-12-17: 3.375 g via INTRAVENOUS
  Filled 2020-12-17: qty 50

## 2020-12-17 MED ORDER — CHLORHEXIDINE GLUCONATE 0.12 % MT SOLN
15.0000 mL | Freq: Once | OROMUCOSAL | Status: AC
  Administered 2020-12-17: 15 mL via OROMUCOSAL
  Filled 2020-12-17: qty 15

## 2020-12-17 MED ORDER — METFORMIN HCL ER 500 MG PO TB24
500.0000 mg | ORAL_TABLET | Freq: Two times a day (BID) | ORAL | Status: DC
  Administered 2020-12-18 – 2020-12-20 (×5): 500 mg via ORAL
  Filled 2020-12-17 (×6): qty 1

## 2020-12-17 MED ORDER — SODIUM CHLORIDE 0.9 % IR SOLN
Status: DC | PRN
  Administered 2020-12-17: 3000 mL

## 2020-12-17 MED ORDER — LIDOCAINE 2% (20 MG/ML) 5 ML SYRINGE
INTRAMUSCULAR | Status: DC | PRN
  Administered 2020-12-17: 100 mg via INTRAVENOUS

## 2020-12-17 MED ORDER — MIDAZOLAM HCL 5 MG/5ML IJ SOLN
INTRAMUSCULAR | Status: DC | PRN
  Administered 2020-12-17: 2 mg via INTRAVENOUS

## 2020-12-17 MED ORDER — PROPOFOL 10 MG/ML IV BOLUS
INTRAVENOUS | Status: AC
  Filled 2020-12-17: qty 20

## 2020-12-17 MED ORDER — VANCOMYCIN HCL 1250 MG/250ML IV SOLN
1250.0000 mg | INTRAVENOUS | Status: DC
  Filled 2020-12-17: qty 250

## 2020-12-17 MED ORDER — ENOXAPARIN SODIUM 60 MG/0.6ML IJ SOSY
60.0000 mg | PREFILLED_SYRINGE | INTRAMUSCULAR | Status: DC
  Administered 2020-12-18 – 2020-12-20 (×3): 60 mg via SUBCUTANEOUS
  Filled 2020-12-17 (×4): qty 0.6

## 2020-12-17 MED ORDER — PROPOFOL 10 MG/ML IV BOLUS
INTRAVENOUS | Status: DC | PRN
  Administered 2020-12-17: 160 mg via INTRAVENOUS

## 2020-12-17 MED ORDER — HYDROMORPHONE HCL 1 MG/ML IJ SOLN
0.2500 mg | INTRAMUSCULAR | Status: DC | PRN
  Administered 2020-12-17 (×2): 0.5 mg via INTRAVENOUS

## 2020-12-17 MED ORDER — BISACODYL 10 MG RE SUPP: 10.0000 mg | Freq: Every day | RECTAL | Status: DC | PRN

## 2020-12-17 MED ORDER — DEXAMETHASONE SODIUM PHOSPHATE 10 MG/ML IJ SOLN
INTRAMUSCULAR | Status: AC
  Filled 2020-12-17: qty 1

## 2020-12-17 MED ORDER — EPHEDRINE 5 MG/ML INJ
INTRAVENOUS | Status: AC
  Filled 2020-12-17: qty 5

## 2020-12-17 MED ORDER — ENOXAPARIN SODIUM 40 MG/0.4ML IJ SOSY: 40.0000 mg | PREFILLED_SYRINGE | INTRAMUSCULAR | Status: DC

## 2020-12-17 MED ORDER — ACETAMINOPHEN 10 MG/ML IV SOLN: 1000.0000 mg | Freq: Once | INTRAVENOUS | Status: DC | PRN

## 2020-12-17 MED ORDER — PIPERACILLIN-TAZOBACTAM 3.375 G IVPB
3.3750 g | Freq: Three times a day (TID) | INTRAVENOUS | Status: DC
  Administered 2020-12-18 (×2): 3.375 g via INTRAVENOUS
  Filled 2020-12-17 (×3): qty 50

## 2020-12-17 MED ORDER — DOCUSATE SODIUM 100 MG PO CAPS
100.0000 mg | ORAL_CAPSULE | Freq: Two times a day (BID) | ORAL | Status: DC
  Administered 2020-12-17 – 2020-12-20 (×6): 100 mg via ORAL
  Filled 2020-12-17 (×6): qty 1

## 2020-12-17 MED ORDER — INSULIN ASPART 100 UNIT/ML IJ SOLN
0.0000 [IU] | Freq: Three times a day (TID) | INTRAMUSCULAR | Status: DC
  Administered 2020-12-17 – 2020-12-18 (×3): 1 [IU] via SUBCUTANEOUS

## 2020-12-17 MED ORDER — LACTATED RINGERS IV SOLN: INTRAVENOUS | Status: DC

## 2020-12-17 MED ORDER — ONDANSETRON HCL 4 MG/2ML IJ SOLN
INTRAMUSCULAR | Status: AC
  Filled 2020-12-17: qty 2

## 2020-12-17 MED ORDER — ATORVASTATIN CALCIUM 40 MG PO TABS
40.0000 mg | ORAL_TABLET | Freq: Every day | ORAL | Status: DC
  Administered 2020-12-18 – 2020-12-20 (×3): 40 mg via ORAL
  Filled 2020-12-17 (×4): qty 1

## 2020-12-17 MED ORDER — ASPIRIN EC 81 MG PO TBEC
81.0000 mg | DELAYED_RELEASE_TABLET | Freq: Every day | ORAL | Status: DC
  Administered 2020-12-17 – 2020-12-20 (×4): 81 mg via ORAL
  Filled 2020-12-17 (×5): qty 1

## 2020-12-17 MED ORDER — FENTANYL CITRATE (PF) 100 MCG/2ML IJ SOLN
INTRAMUSCULAR | Status: DC | PRN
  Administered 2020-12-17 (×2): 50 ug via INTRAVENOUS

## 2020-12-17 MED ORDER — HYDROMORPHONE HCL 1 MG/ML IJ SOLN
0.5000 mg | INTRAMUSCULAR | Status: DC | PRN
Start: 2020-12-17 — End: 2020-12-20
  Administered 2020-12-17: 1 mg via INTRAVENOUS
  Filled 2020-12-17: qty 1

## 2020-12-17 MED ORDER — OXYCODONE HCL 5 MG/5ML PO SOLN: 5.0000 mg | Freq: Once | ORAL | Status: DC | PRN

## 2020-12-17 MED ORDER — METOCLOPRAMIDE HCL 5 MG/ML IJ SOLN: 5.0000 mg | Freq: Three times a day (TID) | INTRAMUSCULAR | Status: DC | PRN

## 2020-12-17 MED ORDER — VANCOMYCIN HCL 1750 MG/350ML IV SOLN
1750.0000 mg | INTRAVENOUS | Status: AC
  Administered 2020-12-17: 1750 mg via INTRAVENOUS
  Filled 2020-12-17 (×2): qty 350

## 2020-12-17 MED ORDER — SODIUM CHLORIDE 0.9 % IV SOLN: INTRAVENOUS | Status: DC

## 2020-12-17 MED ORDER — ONDANSETRON HCL 4 MG/2ML IJ SOLN: 4.0000 mg | Freq: Once | INTRAMUSCULAR | Status: DC | PRN

## 2020-12-17 MED ORDER — POLYETHYLENE GLYCOL 3350 17 G PO PACK: 17.0000 g | PACK | Freq: Every day | ORAL | Status: DC | PRN

## 2020-12-17 MED ORDER — HYDROMORPHONE HCL 1 MG/ML IJ SOLN
INTRAMUSCULAR | Status: AC
  Filled 2020-12-17: qty 1

## 2020-12-17 MED ORDER — CEFAZOLIN IN SODIUM CHLORIDE 3-0.9 GM/100ML-% IV SOLN
3.0000 g | INTRAVENOUS | Status: AC
  Administered 2020-12-17: 3 g via INTRAVENOUS
  Filled 2020-12-17: qty 100

## 2020-12-17 MED ORDER — PANTOPRAZOLE SODIUM 20 MG PO TBEC
20.0000 mg | DELAYED_RELEASE_TABLET | Freq: Two times a day (BID) | ORAL | Status: DC
  Administered 2020-12-17 – 2020-12-20 (×6): 20 mg via ORAL
  Filled 2020-12-17 (×7): qty 1

## 2020-12-17 MED ORDER — ONDANSETRON HCL 4 MG PO TABS: 4.0000 mg | ORAL_TABLET | Freq: Four times a day (QID) | ORAL | Status: DC | PRN

## 2020-12-17 MED ORDER — OXYCODONE HCL 5 MG PO TABS: 5.0000 mg | ORAL_TABLET | Freq: Once | ORAL | Status: DC | PRN

## 2020-12-17 MED ORDER — ORAL CARE MOUTH RINSE: 15.0000 mL | Freq: Once | OROMUCOSAL | Status: AC

## 2020-12-17 MED ORDER — EPHEDRINE SULFATE-NACL 50-0.9 MG/10ML-% IV SOSY
PREFILLED_SYRINGE | INTRAVENOUS | Status: DC | PRN
  Administered 2020-12-17: 10 mg via INTRAVENOUS

## 2020-12-17 SURGICAL SUPPLY — 56 items
BAG COUNTER SPONGE SURGICOUNT (BAG) ×2 IMPLANT
BAG SURGICOUNT SPONGE COUNTING (BAG) ×1
BANDAGE ESMARK 6X9 LF (GAUZE/BANDAGES/DRESSINGS) ×1 IMPLANT
BLADE SURG 10 STRL SS (BLADE) ×3 IMPLANT
BNDG COHESIVE 4X5 TAN STRL (GAUZE/BANDAGES/DRESSINGS) ×3 IMPLANT
BNDG COHESIVE 6X5 TAN STRL LF (GAUZE/BANDAGES/DRESSINGS) ×3 IMPLANT
BNDG ELASTIC 4X5.8 VLCR STR LF (GAUZE/BANDAGES/DRESSINGS) ×3 IMPLANT
BNDG ESMARK 6X9 LF (GAUZE/BANDAGES/DRESSINGS) ×3
CANISTER SUCT 3000ML PPV (MISCELLANEOUS) ×3 IMPLANT
CHLORAPREP W/TINT 26 (MISCELLANEOUS) ×3 IMPLANT
COVER SURGICAL LIGHT HANDLE (MISCELLANEOUS) ×3 IMPLANT
CUFF TOURN SGL QUICK 34 (TOURNIQUET CUFF) ×3
CUFF TOURN SGL QUICK 42 (TOURNIQUET CUFF) IMPLANT
CUFF TRNQT CYL 34X4.125X (TOURNIQUET CUFF) ×1 IMPLANT
DRAPE C-ARM 42X72 X-RAY (DRAPES) ×3 IMPLANT
DRAPE U-SHAPE 47X51 STRL (DRAPES) ×3 IMPLANT
DRSG ADAPTIC 3X8 NADH LF (GAUZE/BANDAGES/DRESSINGS) IMPLANT
DRSG MEPITEL 8X12 (GAUZE/BANDAGES/DRESSINGS) ×2 IMPLANT
DRSG PAD ABDOMINAL 8X10 ST (GAUZE/BANDAGES/DRESSINGS) ×3 IMPLANT
ELECT REM PT RETURN 9FT ADLT (ELECTROSURGICAL) ×3
ELECTRODE REM PT RTRN 9FT ADLT (ELECTROSURGICAL) ×1 IMPLANT
GAUZE SPONGE 4X4 12PLY STRL (GAUZE/BANDAGES/DRESSINGS) ×3 IMPLANT
GLOVE SRG 8 PF TXTR STRL LF DI (GLOVE) ×2 IMPLANT
GLOVE SURG ENC MOIS LTX SZ8 (GLOVE) ×3 IMPLANT
GLOVE SURG LTX SZ8 (GLOVE) ×3 IMPLANT
GLOVE SURG UNDER POLY LF SZ8 (GLOVE) ×6
GOWN STRL REUS W/ TWL LRG LVL3 (GOWN DISPOSABLE) ×1 IMPLANT
GOWN STRL REUS W/ TWL XL LVL3 (GOWN DISPOSABLE) ×2 IMPLANT
GOWN STRL REUS W/TWL LRG LVL3 (GOWN DISPOSABLE) ×3
GOWN STRL REUS W/TWL XL LVL3 (GOWN DISPOSABLE) ×6
KIT BASIN OR (CUSTOM PROCEDURE TRAY) ×3 IMPLANT
KIT TURNOVER KIT B (KITS) ×3 IMPLANT
MEPITEL 8X12 (GAUZE/BANDAGES/DRESSINGS) ×1
NEEDLE 22X1 1/2 (OR ONLY) (NEEDLE) IMPLANT
NS IRRIG 1000ML POUR BTL (IV SOLUTION) ×3 IMPLANT
PACK ORTHO EXTREMITY (CUSTOM PROCEDURE TRAY) ×3 IMPLANT
PAD ARMBOARD 7.5X6 YLW CONV (MISCELLANEOUS) ×6 IMPLANT
PAD CAST 4YDX4 CTTN HI CHSV (CAST SUPPLIES) ×1 IMPLANT
PADDING CAST COTTON 4X4 STRL (CAST SUPPLIES) ×3
SOAP 2 % CHG 4 OZ (WOUND CARE) ×3 IMPLANT
SPONGE T-LAP 4X18 ~~LOC~~+RFID (SPONGE) ×3 IMPLANT
STAPLER VISISTAT 35W (STAPLE) IMPLANT
SUCTION FRAZIER HANDLE 10FR (MISCELLANEOUS) ×3
SUCTION TUBE FRAZIER 10FR DISP (MISCELLANEOUS) ×1 IMPLANT
SUT PROLENE 3 0 PS 2 (SUTURE) ×3 IMPLANT
SUT VIC AB 2-0 CT1 27 (SUTURE) ×6
SUT VIC AB 2-0 CT1 TAPERPNT 27 (SUTURE) ×2 IMPLANT
SUT VIC AB 3-0 PS2 18 (SUTURE) ×3
SUT VIC AB 3-0 PS2 18XBRD (SUTURE) ×1 IMPLANT
SYR CONTROL 10ML LL (SYRINGE) IMPLANT
TOWEL GREEN STERILE (TOWEL DISPOSABLE) ×3 IMPLANT
TOWEL GREEN STERILE FF (TOWEL DISPOSABLE) ×3 IMPLANT
TUBE CONNECTING 12'X1/4 (SUCTIONS) ×1
TUBE CONNECTING 12X1/4 (SUCTIONS) ×2 IMPLANT
TUBING IRRIGATION (MISCELLANEOUS) ×3 IMPLANT
WATER STERILE IRR 1000ML POUR (IV SOLUTION) ×3 IMPLANT

## 2020-12-17 NOTE — Anesthesia Postprocedure Evaluation (Signed)
Anesthesia Post Note  Patient: [name]  Procedure(s) Performed: Removal of deep implant right foot (Right)     Patient location during evaluation: PACU Anesthesia Type: General Level of consciousness: awake and alert Pain management: pain level controlled Vital Signs Assessment: post-procedure vital signs reviewed and stable Respiratory status: spontaneous breathing, nonlabored ventilation, respiratory function stable and patient connected to nasal cannula oxygen Cardiovascular status: blood pressure returned to baseline and stable Postop Assessment: no apparent nausea or vomiting Anesthetic complications: no   No notable events documented.  Last Vitals:  Vitals:   12/17/20 1553 12/17/20 1623  BP: 128/68 120/61  Pulse: 60 (!) 55  Resp: 11 10  Temp:    SpO2: 97% 96%    Last Pain:  Vitals:   12/17/20 1553  TempSrc:   PainSc: 0-No pain                 Trevor Iha

## 2020-12-17 NOTE — Discharge Instructions (Addendum)
Sarah Arthurs, MD [facility]  Please read the following information regarding your care after surgery.  Medications  You only need a prescription for the narcotic pain medicine (ex. oxycodone, Percocet, Norco).  All of the other medicines listed below are available over the counter. X acetominophen (Tylenol) 650 mg every 4-6 hours as you need for minor to moderate pain X Oxycodone 5 mg, as prescribed, for severe pain.  Weight Bearing X Bear weight only on your operated foot in the post-op shoe or boot.  Cast / Splint / Dressing X Keep your splint, cast or dressing clean and dry.  Don't put anything (coat hanger, pencil, etc) down inside of it.  If it gets damp, use a hair dryer on the cool setting to dry it.  If it gets soaked, call the office to schedule an appointment for a cast change.  After your dressing, cast or splint is removed; you may shower, but do not soak or scrub the wound.  Allow the water to run over it, and then gently pat it dry.  Swelling It is normal for you to have swelling where you had surgery.  To reduce swelling and pain, keep your toes above your nose for at least 3 days after surgery.  It may be necessary to keep your foot or leg elevated for several weeks.  If it hurts, it should be elevated.  Follow Up Call my office at [phone] when you are discharged from the hospital or surgery center to schedule an appointment to be seen two weeks after surgery.  Call my office at [phone] if you develop a fever >101.5 F, nausea, vomiting, bleeding from the surgical site or severe pain.         Plate Method for Diabetes   Foods with carbohydrates make your blood glucose level go up. The plate method is a simple way to meal plan and control the amount of carbohydrate you eat.         Use the following guidance to build a healthy plate to control carbohydrates. Divide a 9-inch plate into 3 sections, and consider your beverage the 4th section of your  meal: Food Group Examples of Foods/Beverages for This Section of your Meal  Section 1: Non-starchy vegetables Fill  of your plate to include non-starchy vegetables Asparagus, broccoli, brussels sprouts, cabbage, carrots, cauliflower, celery, cucumber, green beans, mushrooms, peppers, salad greens, tomatoes, or zucchini.  Section 2: Protein foods Fill  of your plate to include a lean protein Lean meat, poultry, fish, seafood, cheese, eggs, lean deli meat, tofu, beans, lentils, nuts or nut butters.  Section 3: Carbohydrate foods Fill  of your plate to include carbohydrate foods Whole grains, whole wheat bread, brown rice, whole grain pasta, polenta, corn tortillas, fruit, or starchy vegetables (potatoes, green peas, corn, beans, acorn squash, and butternut squash). One cup of milk also counts as a food that contains carbohydrate.  Section 4: Beverage Choose water or a low-calorie drink for your beverage. Unsweetened tea, coffee, or flavored/sparkling water without added sugar.  Image reprinted with permission from The American Diabetes Association.  Copyright 2022 by the American Diabetes Association.   Copyright 2022  Academy of Nutrition and Dietetics. All rights reserved     Tips For Adding Protein Nutrition Therapy   Patients may be advised to increase the protein in their diet but not necessarily the calories as well. However, note that when adding protein to your diet, you will also be adding extra calories. The following suggestions may  help add the extra protein while keeping the calories as low as possible.  Tips Add extra egg to one or more meals  Increase the portion of milk to drink and change to skim milk if able  Include [place] yogurt or cottage cheese for snack or part of a meal  Mix protein powder, nut butter, almond/nut milk, non-fat dry milk, or Greek yogurt to shakes and smoothies  Use these ingredients also in baked goods or other recipes Use double the amount of  sandwich filling  Add protein foods to all snacks including cheese, nut butters, milk and yogurt Food Tips for Including Protein  Beans Cook and use dried peas, beans, and tofu in soups or add to casseroles, pastas, and grain dishes that also contain cheese or meat  Mash with cheese and milk  Use tofu to make smoothies  Commercial Protein Supplements Use nutritional supplements or protein powder sold at pharmacies and grocery stores  Use protein powder in milk drinks and desserts, such as pudding  Mix with ice cream, milk, and fruit or other flavorings for a high-protein milkshake  Cottage Cheese or [employer] Mix with or use to stuff fruits and vegetables  Add to casseroles, spaghetti, noodles, or egg dishes such as omelets, scrambled eggs, and souffls  Use gelatin, pudding-type desserts, cheesecake, and pancake or waffle batter  Use to stuff crepes, pasta shells, or manicotti  Puree and use as a substitute for sour cream  Eggs, Egg whites, and Egg Yolks Add chopped, hard-cooked eggs to salads and dressings, vegetables, casseroles, and creamed meats  Beat eggs into mashed potatoes, vegetable purees, and sauces  Add extra egg whites to quiches, scrambled eggs, custards, puddings, pancake batter, or [place] toast wash/batter  Make a rich custard with egg yolks, double strength milk, and sugar  Add extra hard-cooked yolks to deviled egg filling and sandwich spreads  Hard or Semi-Soft Cheese (Cheddar, Ree Kida, [place]) Melt on sandwiches, bread, muffins, tortillas, hamburgers, hot dogs, other meats or fish, vegetables, eggs, or desserts such as stewed figs or pies  Grate and add to soups, sauces, casseroles, vegetable dishes, potatoes, rice noodles, or meatloaf  Serve as a snack with crackers or bagels  Ice cream, Yogurt, and Frozen Yogurt Add to milk drinks such as milkshakes  Add to cereals, fruits, gelatin desserts, and pies  Blend or whip with soft or cooked fruits  Sandwich ice cream or  frozen yogurt between enriched cake slices, cookies, or graham crackers  Use seasoned yogurt as a dip for fruits, vegetables, or chips  Use yogurt in place of sour cream in casseroles  Meat and Fish Add chopped, cooked meat or fish to vegetables, salads, casseroles, soups, sauces, and biscuit dough  Use in omelets, souffls, quiches, and sandwich fillings  Add chicken and [place] to stuffing  Wrap in pie crust or biscuit dough as turnovers  Add to stuffed baked potatoes  Add pureed meat to soups  Milk Use in beverages and in cooking  Use in preparing foods, such as hot cereal, soups, cocoa, or pudding  Add cream sauces to vegetable and other dishes  Use evaporated milk, evaporated skim milk, or sweetened condensed milk instead of milk or water in recipes.  Nonfat Dry Milk Add 1/3 cup of nonfat dry milk powdered milk to each cup of regular milk for "double strength" milk  Add to yogurt and milk drinks, such as pasteurized eggnog and milkshakes  Add to scrambled eggs and mashed potatoes  Use in casseroles,  meatloaf, hot cereal, breads, muffins, sauces, cream soups, puddings and custards, and other milk-based desserts  Nuts, Seeds, and Wheat Germ Add to casseroles, breads, muffins, pancakes, cookies, and waffles  Sprinkle on fruit, cereal, ice cream, yogurt, vegetables, salads, and toast as a crunchy topping  Use in place of breadcrumbs  Blend with parsley or spinach, herbs, and cream for a noodle, pasta, or vegetable sauce.  Roll banana in chopped nuts  Peanut Butter Spread on sandwiches, toast, muffins, crackers, waffles, pancakes, and fruit slices  Use as a dip for raw vegetables, such as carrots, cauliflower, and celery  Blend with milk drinks, smoothies, and other beverages  Swirl through soft ice cream or yogurt  Spread on a banana then roll in crushed, dry cereal or chopped nuts   Copyright 2020  Academy of Nutrition and Dietetics. All rights reserved

## 2020-12-17 NOTE — Anesthesia Procedure Notes (Signed)
Procedure Name: LMA Insertion Date/Time: 12/17/2020 12:36 PM Performed by: Shireen Quan, CRNA Pre-anesthesia Checklist: Patient identified, Emergency Drugs available, Suction available and Patient being monitored Patient Re-evaluated:Patient Re-evaluated prior to induction Oxygen Delivery Method: Circle System Utilized Preoxygenation: Pre-oxygenation with 100% oxygen Induction Type: IV induction Ventilation: Mask ventilation without difficulty LMA: LMA inserted LMA Size: 4.0 Number of attempts: 1 Placement Confirmation: positive ETCO2 Tube secured with: Tape Dental Injury: Teeth and Oropharynx as per pre-operative assessment

## 2020-12-17 NOTE — Transfer of Care (Signed)
Immediate Anesthesia Transfer of Care Note  Patient: [name]  Procedure(s) Performed: Removal of deep implant right foot (Right)  Patient Location: PACU  Anesthesia Type:General  Level of Consciousness: drowsy and patient cooperative  Airway & Oxygen Therapy: Patient Spontanous Breathing and Patient connected to nasal cannula oxygen  Post-op Assessment: Report given to RN, Post -op Vital signs reviewed and stable and Patient moving all extremities  Post vital signs: Reviewed and stable  Last Vitals:  Vitals Value Taken Time  BP 158/68 12/17/20 1323  Temp    Pulse 66 12/17/20 1325  Resp 11 12/17/20 1325  SpO2 100 % 12/17/20 1325  Vitals shown include unvalidated device data.  Last Pain:  Vitals:   12/17/20 1053  TempSrc:   PainSc: 0-No pain         Complications: No notable events documented.

## 2020-12-17 NOTE — Progress Notes (Signed)
Pharmacy Antibiotic Note  [name] is a 65 y.o. female admitted on 12/17/2020 with DFI s/p hardware removal and I&D.  Pharmacy has been consulted for Vanco/Zosyn dosing.  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  PMH: DM, CKD, arthritis, ACD, diabetic ulcers both feet, HTN, GERD, glaucoma, HH, HLD, h/o MRSA, OSA  ID: DFI s/p removal of deep implant R foot + I&D 12/17/20. - Afebrile. WBC 7.5. CrCl 57.  Vanco 12/1>> Zosyn 12/1>>  12/1: Foot tissue:    Plan: Plan:  Vanco 1750mg  IV x 1 Vancomycin 1250 mg IV Q 24 hrs. Goal AUC 400-550. Expected AUC: 517 SCr used: 1.36 Zosyn 3.375g IV q8hr    Height: 5\' 9"  (175.3 cm) Weight: 120.2 kg (265 lb) IBW/kg (Calculated) : 66.2  Temp (24hrs), Avg:97.7 F (36.5 C), Min:97.2 F (36.2 C), Max:98.3 F (36.8 C)  Recent Labs  Lab 12/17/20 1045  WBC 7.5  CREATININE 1.36*    Estimated Creatinine Clearance: 57.2 mL/min (A) (by C-G formula based on SCr of 1.36 mg/dL (H)).    Allergies  Allergen Reactions   Codeine Itching and Swelling    swelling in throat   Pravastatin Other (See Comments)    cramps   Latex Rash    [name] S. , PharmD, BCPS Clinical Staff Pharmacist [email or web address]  14/01/22 12/17/2020 3:27 PM

## 2020-12-17 NOTE — H&P (Signed)
[name] is an 65 y.o. female.   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  She is 7 months out now from surgery.  She has hardware at the medial aspect of the talonavicular joint at the site of the ulcer.  She has been off of antibiotics for a few days now.  She presents for hardware removal and excisional debridement of the wound.  Past Medical History:  Diagnosis Date   Arthritis    Chronic normocytic anemia    CKD (chronic kidney disease) stage 3, GFR 30-59 ml/min ([place]) 02/02/2013   Diabetes mellitus type II, controlled ([place]) 06/17/2009   Diabetic ulcer of both feet ([place]) 01/07/2010   Qualifier: Diagnosis of  By: Ina Homes MD, Amanjot     Essential hypertension 06/17/2009   GERD (gastroesophageal reflux disease)    Glaucoma    H/O hiatal hernia    Hyperlipidemia    MRSA (methicillin resistant Staphylococcus aureus)    Obstructive sleep apnea 05/26/2010    Past Surgical History:  Procedure Laterality Date   ACHILLES TENDON LENGTHENING Left 11/06/2014   Procedure: LEFT ACHILLES TENDON LENGTHENING, ;  Surgeon: [name] Simmer, MD;  Location: [place];  Service: Orthopedics;  Laterality: Left;   APPENDECTOMY  1975   FOOT ARTHRODESIS Left 11/06/2014   Procedure: TALONAVICULAR CANCELLOUS CUBOID ARTHRODESIS WITH CORRECTIVE OSTEOTOMY ;  Surgeon: [name] Simmer, MD;  Location: [place];  Service: Orthopedics;  Laterality: Left;   FOOT ARTHRODESIS Right 05/14/2020   Procedure: Right heelcord lengthening, subtalar and talonavicular arthrodesis, talus exostectomy, talus corrective osteotomy;  Surgeon: [name] Simmer, MD;  Location: [place];  Service: Orthopedics;  Laterality: Right;   HARDWARE REMOVAL Left 02/19/2015   Procedure: REMOVAL OF LEFT FOOT EXTERNAL FIXATOR, CASTING OF LEFT ANKLE ;   Surgeon: [name] Simmer, MD;  Location: [place];  Service: Orthopedics;  Laterality: Left;   I & D EXTREMITY Left 2011   "ulcer on my foot got infected" (01/30/2013)   MULTIPLE TOOTH EXTRACTIONS Bilateral    OOPHORECTOMY Bilateral 1975-1976    Family History  Problem Relation Age of Onset   Diabetes Mother    Hypertension Mother    Dementia Mother    Heart disease Father    Stroke Father    Diabetes Father    Hypertension Father    Gout Father    Deep vein thrombosis Father    Breast cancer Cousin    Breast cancer Cousin    Social History:  reports that she quit smoking about 48 years ago. Her smoking use included cigarettes. She smoked an average of 0.50 packs per day. She has never used smokeless tobacco. She reports that she does not drink alcohol and does not use drugs.  Allergies:  Allergies  Allergen Reactions   Codeine Itching and Swelling    swelling in throat   Pravastatin Other (See Comments)    cramps   Latex Rash    Medications Prior to Admission  Medication Sig Dispense Refill   acetaminophen (TYLENOL) 500 MG tablet Take 500-1,000 mg by mouth every 6 (six) hours as needed for moderate pain or headache.     Ascorbic Acid (VITAMIN C) 1000 MG tablet Take 1,000 mg by mouth daily. [place]     aspirin EC 81 MG tablet Take 81 mg by mouth daily. Swallow  whole.     atorvastatin (LIPITOR) 40 MG tablet TAKE ONE TABLET BY MOUTH DAILY (Patient taking differently: Take 40 mg by mouth daily.) 90 tablet 3   Biotin 5000 MCG CAPS Take 5,000 mcg by mouth daily.     brimonidine-timolol (COMBIGAN) 0.2-0.5 % ophthalmic solution Place 1 drop into both eyes every 12 (twelve) hours. Place one drop to both eyes twice daily for glaucoma (Patient taking differently: Place 1 drop into both eyes 2 (two) times daily. 8 am and 1700) 15 mL 0   calcium carbonate (TUMS - DOSED IN MG ELEMENTAL CALCIUM) 500 MG chewable tablet Chew 500-1,000 mg by mouth daily as needed for indigestion  or heartburn.     Calcium Carbonate-Vitamin D (CALCIUM 600+D PO) Take 600 mg by mouth daily.     Cholecalciferol (VITAMIN D) 50 MCG (2000 UT) tablet Take 2,000 Units by mouth daily.     COLLAGEN PO Take 100 mg by mouth daily.     cycloSPORINE (RESTASIS) 0.05 % ophthalmic emulsion Place 1 drop into both eyes 2 (two) times daily. 8 am and 1700     doxycycline (VIBRA-TABS) 100 MG tablet Take 100 mg by mouth 2 (two) times daily.     Emollient (BAG BALM EX) Apply 1 application topically daily.     ferrous sulfate 325 (65 FE) MG tablet Take 1 tablet (325 mg total) by mouth daily with breakfast. 90 tablet 1   [name] TAKE ONE TABLET BY MOUTH TWICE A DAY 180 tablet 0   lisinopril (ZESTRIL) 10 MG tablet Take 1 tablet (10 mg total) by mouth daily. 90 tablet 1   methocarbamol (ROBAXIN) 500 MG tablet Take 500 mg by mouth every 6 (six) hours as needed for pain.     Multiple Vitamin (MULTIVITAMIN) tablet Take 1 tablet by mouth daily. 50 plus     Netarsudil-Latanoprost (ROCKLATAN) 0.02-0.005 % SOLN Place 1 drop into both eyes daily at 10 pm.     pantoprazole (PROTONIX) 20 MG tablet TAKE ONE TABLET BY MOUTH TWICE A DAY (Patient taking differently: Take 20 mg by mouth 2 (two) times daily.) 180 tablet 2   Polyvinyl Alcohol-Povidone (REFRESH OP) Place 1 drop into both eyes daily as needed (dry eyes).     Vitamin A 2400 MCG (8000 UT) TABS Take 8,000 Units by mouth daily.     vitamin B-12 (CYANOCOBALAMIN) 500 MCG tablet Take 500 mcg by mouth daily.     vitamin E 180 MG (400 UNITS) capsule Take 400 Units by mouth daily.     Zinc 50 MG CAPS Take 50 mg by mouth daily.      ACCU-CHEK SOFTCLIX LANCETS lancets Use to check blood glucose up to three times a day. Dx code E 11.9 100 each 12   docusate sodium (COLACE) 100 MG capsule Take 1 capsule (100 mg total) by mouth 2 (two) times daily. While taking narcotic pain medicine. (Patient not taking: Reported on 12/14/2020) 30 capsule 0   glucose blood  (ACCU-CHEK AVIVA) test strip Use to check blood glucose up to three times a day. Dx code E 11.9 100 each 12   rivaroxaban (XARELTO) 10 MG TABS tablet Take 1 tablet (10 mg total) by mouth daily. (Patient not taking: Reported on 12/14/2020) 14 tablet 0   senna (SENOKOT) 8.6 MG TABS tablet Take 2 tablets (17.2 mg total) by mouth 2 (two) times daily. (Patient not taking: Reported on 12/14/2020) 30 tablet 0    Results for orders placed or  performed during the hospital encounter of 28-Dec-2020 (from the past 48 hour(s))  Glucose, capillary     Status: Abnormal   Collection Time: 28-Dec-2020 10:37 AM  Result Value Ref Range   Glucose-Capillary 101 (H) 70 - 99 mg/dL    Comment: Glucose reference range applies only to samples taken after fasting for at least 8 hours.  CBC per protocol     Status: Abnormal   Collection Time: 28-Dec-2020 10:45 AM  Result Value Ref Range   WBC 7.5 4.0 - 10.5 K/uL   RBC 3.80 (L) 3.87 - 5.11 MIL/uL   Hemoglobin 10.2 (L) 12.0 - 15.0 g/dL   HCT 32.7 (L) 36.0 - 46.0 %   MCV 86.1 80.0 - 100.0 fL   MCH 26.8 26.0 - 34.0 pg   MCHC 31.2 30.0 - 36.0 g/dL   RDW 15.8 (H) 11.5 - 15.5 %   Platelets 269 150 - 400 K/uL   nRBC 0.0 0.0 - 0.2 %    Comment: Performed at [facility], [place] [address], [place], [place] [id number]  Basic metabolic panel per protocol     Status: Abnormal   Collection Time: December 28, 2020 10:45 AM  Result Value Ref Range   Sodium 138 135 - 145 mmol/L   Potassium 4.1 3.5 - 5.1 mmol/L   Chloride 105 98 - 111 mmol/L   CO2 25 22 - 32 mmol/L   Glucose, Bld 104 (H) 70 - 99 mg/dL    Comment: Glucose reference range applies only to samples taken after fasting for at least 8 hours.   BUN 24 (H) 8 - 23 mg/dL   Creatinine, Ser 1.36 (H) 0.44 - 1.00 mg/dL   Calcium 9.2 8.9 - 10.3 mg/dL   GFR, Estimated 43 (L) >60 mL/min    Comment: (NOTE) Calculated using the CKD-EPI Creatinine Equation (2021)    Anion gap 8 5 - 15    Comment: Performed at [facility] [address], [place], [place] [address]   No results found.  Review of Systems no recent fever, chills, nausea, vomiting or changes in her appetite  Blood pressure (!) 119/52, pulse 66, temperature 98.3 F (36.8 C), temperature source Oral, resp. rate 17, height 5\' 9"  (1.753 m), weight 120.2 kg, SpO2 100 %. Physical Exam  Well-nourished well-developed woman in no apparent distress.  Alert and oriented x4.  Normal mood and affect.  Right foot has healed surgical incisions around the ankle with a small ulcer at the medial navicular.  No evident bone or hardware.  No signs of cellulitis.  Diminished sensibility to light touch the forefoot.  Brisk capillary refill at the toes.  Assessment/Plan Right foot diabetic ulcer and retained hardware -to the operating room today for hardware removal and debridement of the ulcer.  We will hold antibiotics pending intraoperative cultures.  She will be admitted postoperatively for IV antibiotics pending culture results.  The risks and benefits of the alternative treatment options have been discussed in detail.  The patient wishes to proceed with surgery and specifically understands risks of bleeding, infection, nerve damage, blood clots, need for additional surgery, amputation and death.   [name] Simmer, MD 2020/12/28, 11:50 AM

## 2020-12-17 NOTE — Consult Note (Signed)
Date: 12/17/2020               Patient Name:  [name] MRN: [id number]  DOB: 1955-09-29 Age / Sex: 65 y.o., female   PCP: Lajean Manes, MD         Requesting Physician: Dr. [name] Simmer, MD    Consulting Reason:  Medical management of chronic non-surgical conditions     Chief Complaint: Foot swelling  History of Present Illness:   Pearly Labianca is a 65 y.o. female with a PMH of HLD, T2DM, HTN, CKD who was admitted for a deep implant removal and irrigation and debridement of a right medial midfoot diabetic ulcer by orthopedics. Medicine was consulted for management of her chronic conditions. Patient says that she had surgery on her foot back in April and was working with PT. She said things had been going very well and she had progressed from a wheelchair to only needed a cane but then a couple of weeks ago she started noticing some swelling of the foot. She was put back on antibiotics at that time but then was told she would need an operation for which she was admitted. She endorses decreased appetite and PO intake over the last couple of weeks along with a couple of fevers to about 101-102 at home. She denies any diarrhea or vomiting and says that she is still making good urine.  Meds: Current Facility-Administered Medications  Medication Dose Route Frequency Provider Last Rate Last Admin   0.9 %  sodium chloride infusion   Intravenous Continuous Ollis, Marya Amsler, PA-C       acetaminophen (OFIRMEV) IV 1,000 mg  1,000 mg Intravenous Once PRN Barnet Glasgow, MD       amisulpride (BARHEMSYS) injection 10 mg  10 mg Intravenous Once PRN Barnet Glasgow, MD       HYDROmorphone (DILAUDID) injection 0.25-0.5 mg  0.25-0.5 mg Intravenous Q5 min PRN Barnet Glasgow, MD       lactated ringers infusion   Intravenous Continuous Barnet Glasgow, MD 10 mL/hr at 12/17/20 1229 Continued from Pre-op at 12/17/20 1229   ondansetron (ZOFRAN) injection 4 mg  4 mg Intravenous Once PRN Barnet Glasgow, MD       oxyCODONE (Oxy IR/ROXICODONE) immediate release tablet 5 mg  5 mg Oral Once PRN Barnet Glasgow, MD       Or   oxyCODONE (ROXICODONE) 5 MG/5ML solution 5 mg  5 mg Oral Once PRN Barnet Glasgow, MD        Allergies: Allergies as of 12/07/2020 - Review Complete 05/14/2020  Allergen Reaction Noted   Codeine Itching and Swelling 06/17/2009   Pravastatin Other (See Comments) 03/09/2012   Latex Rash 06/23/2016   Past Medical History:  Diagnosis Date   Arthritis    Chronic normocytic anemia    CKD (chronic kidney disease) stage 3, GFR 30-59 ml/min ([place]) 02/02/2013   Diabetes mellitus type II, controlled ([place]) 06/17/2009   Diabetic ulcer of both feet ([place]) 01/07/2010   Qualifier: Diagnosis of  By: Ina Homes MD, Amanjot     Essential hypertension 06/17/2009   GERD (gastroesophageal reflux disease)    Glaucoma    H/O hiatal hernia    Hyperlipidemia    MRSA (methicillin resistant Staphylococcus aureus)    Obstructive sleep apnea 05/26/2010   Past Surgical History:  Procedure Laterality Date   ACHILLES TENDON LENGTHENING Left 11/06/2014   Procedure: LEFT ACHILLES TENDON LENGTHENING, ;  Surgeon: [name] Simmer, MD;  Location: [facility] OR;  Service: Orthopedics;  Laterality: Left;   APPENDECTOMY  1975   FOOT ARTHRODESIS Left 11/06/2014   Procedure: TALONAVICULAR CANCELLOUS CUBOID ARTHRODESIS WITH CORRECTIVE OSTEOTOMY ;  Surgeon: Toni Arthurs, MD;  Location: [facility] OR;  Service: Orthopedics;  Laterality: Left;   FOOT ARTHRODESIS Right 05/14/2020   Procedure: Right heelcord lengthening, subtalar and talonavicular arthrodesis, talus exostectomy, talus corrective osteotomy;  Surgeon: Toni Arthurs, MD;  Location: [facility] OR;  Service: Orthopedics;  Laterality: Right;   HARDWARE REMOVAL Left 02/19/2015   Procedure: REMOVAL OF LEFT FOOT EXTERNAL FIXATOR, CASTING OF LEFT ANKLE ;  Surgeon: Toni Arthurs, MD;  Location: [facility];  Service: Orthopedics;  Laterality: Left;   I & D  EXTREMITY Left 2011   "ulcer on my foot got infected" (01/30/2013)   MULTIPLE TOOTH EXTRACTIONS Bilateral    OOPHORECTOMY Bilateral 1975-1976   Family History  Problem Relation Age of Onset   Diabetes Mother    Hypertension Mother    Dementia Mother    Heart disease Father    Stroke Father    Diabetes Father    Hypertension Father    Gout Father    Deep vein thrombosis Father    Breast cancer Cousin    Breast cancer Cousin    Social History   Socioeconomic History   Marital status: Single    Spouse name: Not on file   Number of children: Not on file   Years of education: Not on file   Highest education level: Not on file  Occupational History   Occupation: Disabled  Tobacco Use   Smoking status: Former    Packs/day: 0.50    Years: 0.00    Pack years: 0.00    Types: Cigarettes    Quit date: 12/22/1972    Years since quitting: 48.0   Smokeless tobacco: Never  Vaping Use   Vaping Use: Never used  Substance and Sexual Activity   Alcohol use: No   Drug use: No   Sexual activity: Not Currently  Other Topics Concern   Not on file  Social History Narrative   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   Smoked when she was a teenager   no alcohol   no Drugs      Current Social History 10/26/2018        Patient lives on the main level of a 3 floor boarding house with 3 other tenants. There are 3 steps with handrails up to the entrance the patient uses.       Patient's method of transportation is personal car.      The highest level of education was Bachelor's Degree.      The patient currently disabled.      Identified important Relationships are "My cousins, [name] and Lianne Moris, and my dear friend Mliss Sax."      Pets : None       Interests / Fun: "I love to read, write, photography: go around [place] taking photos of the [address], [employer]Art, Art Museums, cooking, hanging out with friends, several ministries  with my Church, especially working with the youth."      Current Stressors: "Worrying about someone to care for me if anything happens to me. 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[place], BSN, RN-BC  Social Determinants of Health   Financial Resource Strain: Not on file  Food Insecurity: Not on file  Transportation Needs: Not on file  Physical Activity: Not on file  Stress: Not on file  Social Connections: Not on file  Intimate Partner Violence: Not on file    Review of Systems: Pertinent items are noted in HPI.  Physical Exam: Blood pressure (!) 163/87, pulse 63, temperature (!) 97.5 F (36.4 C), resp. rate 15, height 5\' 9"  (1.753 m), weight 120.2 kg, SpO2 100 %. Gen: Obese appearing female resting comfortably in bed in NAD CV: RRR, no m/r/g Resp: lungs CTAB, normal WOB on room air Abd: soft, nontender, mildly distended, normal bowel sounds MSK: moves all extremities spontaneously, right foot dressed c/d/I Neuro: alert, oriented, answering questions appropriately Psych: normal affect  Lab results: CBC    Component Value Date/Time   WBC 7.5 12/17/2020 1045   RBC 3.80 (L) 12/17/2020 1045   HGB 10.2 (L) 12/17/2020 1045   HGB 9.3 (L) 02/28/2019 1440   HCT 32.7 (L) 12/17/2020 1045   HCT 30.5 (L) 02/28/2019 1440   PLT 269 12/17/2020 1045   PLT 331 02/28/2019 1440   MCV 86.1 12/17/2020 1045   MCV 85 02/28/2019 1440   MCH 26.8 12/17/2020 1045   MCHC 31.2 12/17/2020 1045   RDW 15.8 (H) 12/17/2020 1045   RDW 15.4 02/28/2019 1440   LYMPHSABS 1.9 02/28/2019 1440   MONOABS 0.6 01/30/2013 1726   EOSABS 0.2 02/28/2019 1440   BASOSABS 0.0 02/28/2019 1440   BMP Latest Ref Rng & Units 12/17/2020 05/17/2020 05/15/2020  Glucose 70 - 99 mg/dL 104(H) 127(H) 130(H)  BUN 8 - 23 mg/dL 24(H) 17 24(H)  Creatinine 0.44 - 1.00 mg/dL 1.36(H) 1.33(H) 1.38(H)  BUN/Creat Ratio 12 - 28 - - -  Sodium 135 -  145 mmol/L 138 134(L) 135  Potassium 3.5 - 5.1 mmol/L 4.1 4.1 4.1  Chloride 98 - 111 mmol/L 105 105 103  CO2 22 - 32 mmol/L 25 23 25   Calcium 8.9 - 10.3 mg/dL 9.2 8.2(L) 8.3(L)      Imaging results:  DG MINI C-ARM IMAGE ONLY  Result Date: 12/17/2020 There is no interpretation for this exam.  This order is for images obtained during a surgical procedure.  Please See "Surgeries" Tab for more information regarding the procedure.    Other results: EKG: N/A  Assessment, Plan, & Recommendations by Problem: Principal Problem:   Painful orthopaedic hardware ([place])  Isley Shriber is a 66 y.o. female with a PMH of HLD, T2DM, HTN, CKD who was admitted for a deep implant removal and irrigation and debridement of a right medial midfoot diabetic ulcer by orthopedics.   Right medial midfoot diabetic ulcer s/p debridement and irrigation on 12/1 Pain management per primary team. ID has also been consulted about antibiotics, has received ancef in the OR. Was on doxycyline as an outpatient. Previous diabetic ulcer on opposite leg grew MRSA and pseudomonas in the past. Will likely need broad spectrum antibiotics such as vanc/cefepime but given ID has already been consulted will defer to them at this time. With kidney disease would avoid vanc/zosyn if possible. - f/u cultures - broad spectrum antibiotics  T2DM Patient has a history of T2DM, takes janumet at home. Given renal function is stable will continue this.  - continue janumet 50-500 - ssi with meals - f/u A1c  HTN Patient reports pressures well controlled at home, elevated to the 140-60s in the post-op setting, likely secondary to stress.  Will continue home medications and CTM - continue home lisinopril 10 daily  CKD Stable and at baseline -avoid nephrotoxic medications -daily bmp  Normocytic anemia Stable, 10.2 on admission, will continue to monitor given recent procedure though blood loss likely minimal.  - transfuse <7 - daily  CBC  OSA -continue home CPAP  GERD -continue home protonix 20 BID  HLD - continue home atorvastatin 40 daily  Signed: Scarlett Presto, MD 12/17/2020, 2:58 PM

## 2020-12-17 NOTE — Op Note (Signed)
12/17/2020  1:31 PM  PATIENT:  [name]  65 y.o. female  PRE-OPERATIVE DIAGNOSIS:  1.  Painful hardware right foot 2.  Right medial midfoot diabetic ulcer  POST-OPERATIVE DIAGNOSIS: Same  Procedure(s): 1.  Removal of deep implant from the right medial midfoot 2.  Irrigation and excisional debridement of right medial midfoot diabetic ulcer  SURGEON:  Toni Arthurs, MD  ASSISTANT: None  ANESTHESIA:   General  EBL:  minimal   TOURNIQUET: Less than 30 minutes with an ankle Esmarch  COMPLICATIONS:  None apparent  DISPOSITION:  Extubated, awake and stable to recovery.  Debridement type: Excisional Debridement  Side: right  Body Location: medial midfoot   Tools used for debridement: scalpel, scissors, curette, and rongeur  Pre-debridement Wound size (cm):   Length: 3 mm        Width: 42mm     Depth: 1 cm   Post-debridement Wound size (cm):   Length: 5 cm        Width: 1 cm     Depth: 2 cm   Debridement depth beyond dead/damaged tissue down to healthy viable tissue: yes  Tissue layer involved: skin and skin, subcutaneous tissue, muscle / fascia, bone  Nature of tissue removed: Devitalized Tissue, Non-viable tissue, and Purulence  Irrigation volume: 3L     Irrigation fluid type: Normal Saline    INDICATION FOR PROCEDURE: The patient is a 65 year old female who is about 7 months out from midfoot arthrodesis for Charcot.  She has developed drainage and an ulcer at the medial midfoot.  She has painful hardware at the site of arthrodesis.  She presents today for removal of hardware and irrigation and debridement of the ulcer.  The risks and benefits of the alternative treatment options have been discussed in detail.  The patient wishes to proceed with surgery and specifically understands risks of bleeding, infection, nerve damage, blood clots, need for additional surgery, amputation and death.   PROCEDURE IN DETAIL: After preoperative consent was obtained and the correct  operative site was identified, the patient was brought to the operating room and placed upon the operating table.  Preoperative antibiotics were held pending intraoperative cultures.  General anesthesia was induced.  A surgical timeout was taken.  The right lower extremity was prepped and draped in standard sterile fashion.  The foot was exsanguinated and a 4 inch Esmarch tourniquet wrapped around the ankle.  Location of the ulcer was identified.  A small area of fluctuance dorsal to the ulcer was identified.  The incision was made extending over the area of fluctuance and down to the ulcer.  The ulcer was excised in its entirety.  The screw head was identified in the dorsal portion of the wound.  A screwdriver was used to remove it without difficulty after clearing the head of all fibrous scar tissue.  The washer was then removed.  The excisional debridement was then performed from the level of skin down through the subcutaneous tissues and into the bone of the screw tract.  All devitalized and nonviable tissue was removed.  Purulent material at the dorsal wound was sharply excised with a scalpel and sent as a specimen to microbiology for aerobic and anaerobic culture.  The wound was then irrigated with 3 L of normal saline.  The remaining soft tissue appeared healthy with no evidence of contamination or bony involvement.  The wound was closed with horizontal mattress sutures of 2-0 nylon.  Sterile dressings were applied followed by compression wrap.  The  tourniquet was released after application of the dressings.  The patient was awakened from anesthesia and transported to the recovery room in stable condition.   FOLLOW UP PLAN: Weightbearing as tolerated on the right foot.  ID consultation for treatment of the wound.  Hospitalist consultation for management of her medical comorbidities.

## 2020-12-18 ENCOUNTER — Observation Stay: Payer: Self-pay

## 2020-12-18 ENCOUNTER — Encounter (HOSPITAL_COMMUNITY): Payer: Self-pay | Admitting: Orthopedic Surgery

## 2020-12-18 DIAGNOSIS — Z8249 Family history of ischemic heart disease and other diseases of the circulatory system: Secondary | ICD-10-CM | POA: Diagnosis not present

## 2020-12-18 DIAGNOSIS — D631 Anemia in chronic kidney disease: Secondary | ICD-10-CM | POA: Diagnosis present

## 2020-12-18 DIAGNOSIS — Z885 Allergy status to narcotic agent status: Secondary | ICD-10-CM | POA: Diagnosis not present

## 2020-12-18 DIAGNOSIS — T8484XA Pain due to internal orthopedic prosthetic devices, implants and grafts, initial encounter: Principal | ICD-10-CM

## 2020-12-18 DIAGNOSIS — Z20822 Contact with and (suspected) exposure to covid-19: Secondary | ICD-10-CM | POA: Diagnosis present

## 2020-12-18 DIAGNOSIS — E1122 Type 2 diabetes mellitus with diabetic chronic kidney disease: Secondary | ICD-10-CM | POA: Diagnosis present

## 2020-12-18 DIAGNOSIS — T847XXA Infection and inflammatory reaction due to other internal orthopedic prosthetic devices, implants and grafts, initial encounter: Secondary | ICD-10-CM | POA: Diagnosis not present

## 2020-12-18 DIAGNOSIS — E785 Hyperlipidemia, unspecified: Secondary | ICD-10-CM | POA: Diagnosis present

## 2020-12-18 DIAGNOSIS — E11610 Type 2 diabetes mellitus with diabetic neuropathic arthropathy: Secondary | ICD-10-CM | POA: Diagnosis present

## 2020-12-18 DIAGNOSIS — M869 Osteomyelitis, unspecified: Secondary | ICD-10-CM

## 2020-12-18 DIAGNOSIS — G4733 Obstructive sleep apnea (adult) (pediatric): Secondary | ICD-10-CM | POA: Diagnosis present

## 2020-12-18 DIAGNOSIS — E11621 Type 2 diabetes mellitus with foot ulcer: Secondary | ICD-10-CM | POA: Diagnosis present

## 2020-12-18 DIAGNOSIS — E1169 Type 2 diabetes mellitus with other specified complication: Secondary | ICD-10-CM | POA: Diagnosis present

## 2020-12-18 DIAGNOSIS — L02611 Cutaneous abscess of right foot: Secondary | ICD-10-CM | POA: Diagnosis present

## 2020-12-18 DIAGNOSIS — L97419 Non-pressure chronic ulcer of right heel and midfoot with unspecified severity: Secondary | ICD-10-CM | POA: Diagnosis present

## 2020-12-18 DIAGNOSIS — K219 Gastro-esophageal reflux disease without esophagitis: Secondary | ICD-10-CM | POA: Diagnosis present

## 2020-12-18 DIAGNOSIS — Z6839 Body mass index (BMI) 39.0-39.9, adult: Secondary | ICD-10-CM | POA: Diagnosis not present

## 2020-12-18 DIAGNOSIS — E1065 Type 1 diabetes mellitus with hyperglycemia: Secondary | ICD-10-CM

## 2020-12-18 DIAGNOSIS — E669 Obesity, unspecified: Secondary | ICD-10-CM | POA: Diagnosis present

## 2020-12-18 DIAGNOSIS — N1832 Chronic kidney disease, stage 3b: Secondary | ICD-10-CM

## 2020-12-18 DIAGNOSIS — N1831 Chronic kidney disease, stage 3a: Secondary | ICD-10-CM | POA: Diagnosis present

## 2020-12-18 DIAGNOSIS — Z87891 Personal history of nicotine dependence: Secondary | ICD-10-CM | POA: Diagnosis not present

## 2020-12-18 DIAGNOSIS — I129 Hypertensive chronic kidney disease with stage 1 through stage 4 chronic kidney disease, or unspecified chronic kidney disease: Secondary | ICD-10-CM | POA: Diagnosis present

## 2020-12-18 DIAGNOSIS — H409 Unspecified glaucoma: Secondary | ICD-10-CM | POA: Diagnosis present

## 2020-12-18 DIAGNOSIS — Z981 Arthrodesis status: Secondary | ICD-10-CM | POA: Diagnosis not present

## 2020-12-18 DIAGNOSIS — Z888 Allergy status to other drugs, medicaments and biological substances status: Secondary | ICD-10-CM | POA: Diagnosis not present

## 2020-12-18 DIAGNOSIS — Z833 Family history of diabetes mellitus: Secondary | ICD-10-CM | POA: Diagnosis not present

## 2020-12-18 DIAGNOSIS — Y831 Surgical operation with implant of artificial internal device as the cause of abnormal reaction of the patient, or of later complication, without mention of misadventure at the time of the procedure: Secondary | ICD-10-CM | POA: Diagnosis present

## 2020-12-18 LAB — BASIC METABOLIC PANEL
Anion gap: 6 (ref 5–15)
BUN: 22 mg/dL (ref 8–23)
CO2: 25 mmol/L (ref 22–32)
Calcium: 8.8 mg/dL — ABNORMAL LOW (ref 8.9–10.3)
Chloride: 104 mmol/L (ref 98–111)
Creatinine, Ser: 1.21 mg/dL — ABNORMAL HIGH (ref 0.44–1.00)
GFR, Estimated: 50 mL/min — ABNORMAL LOW (ref >60)
Glucose, Bld: 126 mg/dL — ABNORMAL HIGH (ref 70–99)
Potassium: 3.9 mmol/L (ref 3.5–5.1)
Sodium: 135 mmol/L (ref 135–145)

## 2020-12-18 LAB — GLUCOSE, CAPILLARY
Glucose-Capillary: 106 mg/dL — ABNORMAL HIGH (ref 70–99)
Glucose-Capillary: 129 mg/dL — ABNORMAL HIGH (ref 70–99)
Glucose-Capillary: 139 mg/dL — ABNORMAL HIGH (ref 70–99)
Glucose-Capillary: 112 mg/dL — ABNORMAL HIGH (ref 70–99)

## 2020-12-18 LAB — CK: Total CK: 49 U/L (ref 38–234)

## 2020-12-18 LAB — CBC
HCT: 29.2 % — ABNORMAL LOW (ref 36.0–46.0)
Hemoglobin: 9.4 g/dL — ABNORMAL LOW (ref 12.0–15.0)
MCH: 27.2 pg (ref 26.0–34.0)
MCHC: 32.2 g/dL (ref 30.0–36.0)
MCV: 84.6 fL (ref 80.0–100.0)
Platelets: 257 10*3/uL (ref 150–400)
RBC: 3.45 MIL/uL — ABNORMAL LOW (ref 3.87–5.11)
RDW: 15.9 % — ABNORMAL HIGH (ref 11.5–15.5)
WBC: 11 10*3/uL — ABNORMAL HIGH (ref 4.0–10.5)
nRBC: 0 % (ref 0.0–0.2)

## 2020-12-18 MED ORDER — GLUCERNA SHAKE PO LIQD
237.0000 mL | ORAL | Status: DC
  Administered 2020-12-18 – 2020-12-20 (×3): 237 mL via ORAL
  Filled 2020-12-18: qty 237

## 2020-12-18 MED ORDER — CEFEPIME HCL 2 G IJ SOLR
2.0000 g | Freq: Three times a day (TID) | INTRAMUSCULAR | Status: DC
  Administered 2020-12-18 – 2020-12-20 (×5): 2 g via INTRAVENOUS
  Filled 2020-12-18 (×5): qty 2

## 2020-12-18 MED ORDER — CEFEPIME IV (FOR PTA / DISCHARGE USE ONLY): 2.0000 g | Freq: Three times a day (TID) | INTRAVENOUS | 0 refills | Status: DC

## 2020-12-18 MED ORDER — CYCLOSPORINE 0.05 % OP EMUL
1.0000 [drp] | Freq: Two times a day (BID) | OPHTHALMIC | Status: DC
  Administered 2020-12-18 – 2020-12-20 (×5): 1 [drp] via OPHTHALMIC
  Filled 2020-12-18 (×6): qty 30

## 2020-12-18 MED ORDER — DOCUSATE SODIUM 100 MG PO CAPS: 100.0000 mg | ORAL_CAPSULE | Freq: Two times a day (BID) | ORAL | 0 refills | Status: DC

## 2020-12-18 MED ORDER — SODIUM CHLORIDE 0.9 % IV SOLN
8.0000 mg/kg | Freq: Every day | INTRAVENOUS | Status: DC
  Administered 2020-12-18 – 2020-12-20 (×3): 700 mg via INTRAVENOUS
  Filled 2020-12-18 (×3): qty 14

## 2020-12-18 MED ORDER — CHLORHEXIDINE GLUCONATE CLOTH 2 % EX PADS
6.0000 | MEDICATED_PAD | Freq: Every day | CUTANEOUS | Status: DC
  Administered 2020-12-18 – 2020-12-20 (×3): 6 via TOPICAL

## 2020-12-18 MED ORDER — DAPTOMYCIN IV (FOR PTA / DISCHARGE USE ONLY): 700.0000 mg | INTRAVENOUS | 0 refills | Status: AC

## 2020-12-18 MED ORDER — SENNA 8.6 MG PO TABS: 2.0000 | ORAL_TABLET | Freq: Two times a day (BID) | ORAL | 0 refills | Status: DC

## 2020-12-18 MED ORDER — KETOTIFEN FUMARATE 0.025 % OP SOLN
1.0000 [drp] | Freq: Two times a day (BID) | OPHTHALMIC | Status: DC
  Administered 2020-12-18 – 2020-12-20 (×5): 1 [drp] via OPHTHALMIC
  Filled 2020-12-18: qty 5

## 2020-12-18 MED ORDER — SODIUM CHLORIDE 0.9% FLUSH: 10.0000 mL | INTRAVENOUS | Status: DC | PRN

## 2020-12-18 MED ORDER — SODIUM CHLORIDE 0.9% FLUSH
10.0000 mL | Freq: Two times a day (BID) | INTRAVENOUS | Status: DC
  Administered 2020-12-18 – 2020-12-20 (×3): 10 mL

## 2020-12-18 MED ORDER — OXYCODONE HCL 5 MG PO TABS: 5.0000 mg | ORAL_TABLET | ORAL | 0 refills | Status: AC | PRN

## 2020-12-18 NOTE — Progress Notes (Addendum)
HD#0 Subjective:  Overnight Events: NAEO   Patient says that she had one episode of nausea and emesis yesterday after surgery which she thinks could have been related to pain. Since then she has been feeling well. No other complaints.  Objective:  Vital signs in last 24 hours: Vitals:   12/17/20 2343 12/18/20 0412 12/18/20 0742 12/18/20 1202  BP: 136/66 139/69 135/71 120/67  Pulse: (!) 54 61 70 72  Resp: 17 18 15 16   Temp: 98.4 F (36.9 C) (!) 97.5 F (36.4 C) 98.5 F (36.9 C) 98.3 F (36.8 C)  TempSrc:  Oral Oral Oral  SpO2: 98% 100% 99% 98%  Weight:      Height:       Supplemental O2: Room Air SpO2: 98 % O2 Flow Rate (L/min): 2 L/min   Physical Exam:  Gen: Obese appearing female resting comfortably in bed in NAD CV: RRR, no m/r/g Resp: lungs CTAB, normal WOB on room air Abd: soft, nontender, mildly distended, normal bowel sounds MSK: moves all extremities spontaneously, right foot dressed c/d/I Neuro: alert, oriented, answering questions appropriately Psych: normal affect    Filed Weights   12/15/20 1150 12/17/20 1034  Weight: 120.2 kg 120.2 kg     Intake/Output Summary (Last 24 hours) at 12/18/2020 1236 Last data filed at 12/18/2020 1234 Gross per 24 hour  Intake 1651.8 ml  Output 951 ml  Net 700.8 ml   Net IO Since Admission: 700.8 mL [12/18/20 1236]  Pertinent Labs: CBC Latest Ref Rng & Units 12/18/2020 12/17/2020 05/18/2020  WBC 4.0 - 10.5 K/uL 11.0(H) 7.5 9.4  Hemoglobin 12.0 - 15.0 g/dL 9.4(L) 10.2(L) 8.5(L)  Hematocrit 36.0 - 46.0 % 29.2(L) 32.7(L) 26.0(L)  Platelets 150 - 400 K/uL 257 269 180    CMP Latest Ref Rng & Units 12/18/2020 12/17/2020 05/17/2020  Glucose 70 - 99 mg/dL 126(H) 104(H) 127(H)  BUN 8 - 23 mg/dL 22 24(H) 17  Creatinine 0.44 - 1.00 mg/dL 1.21(H) 1.36(H) 1.33(H)  Sodium 135 - 145 mmol/L 135 138 134(L)  Potassium 3.5 - 5.1 mmol/L 3.9 4.1 4.1  Chloride 98 - 111 mmol/L 104 105 105  CO2 22 - 32 mmol/L 25 25 23   Calcium 8.9 -  10.3 mg/dL 8.8(L) 9.2 8.2(L)  Total Protein 6.0 - 8.3 g/dL - - -  Total Bilirubin 0.3 - 1.2 mg/dL - - -  Alkaline Phos 25 - 125 U/L - - -  AST 13 - 35 U/L - - -  ALT 7 - 35 U/L - - -    Imaging: [place] EKG SITE RITE  Result Date: 12/18/2020 If Site Rite image not attached, placement could not be confirmed due to current cardiac rhythm.   Assessment/Plan:   Principal Problem:   Painful orthopaedic hardware [facility]) Active Problems:   Diabetes mellitus type II, controlled ([place])   Essential hypertension   CKD (chronic kidney disease) stage 3, GFR 30-59 ml/min ([facility])   Diabetic osteomyelitis ([place])   Patient Summary: [name] is a 65 y.o. with a PMH of HLD, T2DM, HTN, CKD who was admitted for a deep implant removal and irrigation and debridement of a right medial midfoot diabetic ulcer by orthopedics.    Diabetic osteomyelitis Pain management per primary team. ID has also been consulted about antibiotics, cultures have not grown anything thus far. Recommending empiric coverage with daptomycin once daily and cefepime 3 times daily. Slight leukocytosis likely secondary to stress. - f/u cultures - continue antibiotics; 6 week course per ID -  strict glucose control as below   Well controlled T2DM Patient has a history of well controlled T2DM. Did receive dexamethasone but sugars have remained under control despite this, has only received 2 total units of sliding scale. - glucose goal <180 to promote wound healing - continue linagliptin and metformin which is hospital formulary equivalent for home Janumet. - ssi with meals - A1c 6.8   HTN Pressures doing well, running in the 120s-130s. - continue home lisinopril 10 daily   CKD Stable and at baseline -avoid nephrotoxic medications; agree with avoiding vanc/zosyn -daily bmp   Normocytic anemia Stable, 10.2 on admission, down to 9.4. Do not suspect continued bleeding, will continue to monitor. - transfuse <7 - daily CBC    OSA -continue home CPAP   GERD -continue home protonix 20 BID   HLD - continue home atorvastatin 40 daily  Philena Obey, MD Internal Medicine Resident PGY-1 Pager [phone] Please contact the on call pager after 5 pm and on weekends at [phone].

## 2020-12-18 NOTE — TOC Initial Note (Addendum)
Transition of Care [facility]) - Initial/Assessment Note    Patient Details  Name: [name] MRN: [id number] Date of Birth: 25-Nov-1955  Transition of Care [facility]) CM/SW Contact:    Epifanio Lesches, RN Phone Number: 12/18/2020, 1:45 PM  Clinical Narrative:                 Admitted for a deep implant removal and I&D of R midfoot diabetic ulcer, s/p procedure 12/1. Pt states from home alone. States independent with ADL's PTA. DME: W/C, rolling walker , 3in1/BSC and shower bench. NCM spoke with pt @ bedside regarding d/c planning. Pt states aware of LT ABX THERAPY need. States had done home ABX therapy in the past and feels comfortable having to administer ABX therapy again. States have family and friend Production assistant, radio support.  Pt states not interested in SNF placement.  Referral made with Elita Quick / [facility] for IV ABX therapy, and [facility] for [facility] and PT( pending  MD's order and Face to Face), acceptance pending.  Pt without DME needs...  TOC team will continue to monitor  and assist with needs.   12/18/2020  Approval received from [facility] and [facility] to provide [facility] services. Home infusion teaching needs to be completed  by Pam with [facility] before pt can d/c.  Expected Discharge Plan: Home w Home Health Services Barriers to Discharge: Continued Medical Work up   Patient Goals and CMS Choice     Choice offered to / list presented to : Patient  Expected Discharge Plan and Services Expected Discharge Plan: Home w Home Health Services   Discharge Planning Services: CM Consult   Living arrangements for the past 2 months: Single Family Home                 DME Arranged: Other see comment (IV ABX therapy) DME Agency: Other - Comment ([facility]) Date DME Agency Contacted: 12/18/20 Time DME Agency Contacted: 1340 Representative spoke with at DME Agency: Pam HH Arranged: PT, RN HH Agency: [facility] Date [facility]  Agency Contacted: 12/18/20 Time HH Agency Contacted: 1344 Representative spoke with at [facility] Agency: Kandee Keen  Prior Living Arrangements/Services Living arrangements for the past 2 months: Single Family Home Lives with:: Self Patient language and need for interpreter reviewed:: Yes Do you feel safe going back to the place where you live?: Yes      Need for Family Participation in Patient Care: Yes (Comment) Care giver support system in place?: No (comment)   Criminal Activity/Legal Involvement Pertinent to Current Situation/Hospitalization: No - Comment as needed  Activities of Daily Living Home Assistive Devices/Equipment: Cane (specify quad or straight), Dentures (specify type), CBG Meter, Eyeglasses ADL Screening (condition at time of admission) Patient's cognitive ability adequate to safely complete daily activities?: Yes Is the patient deaf or have difficulty hearing?: No Does the patient have difficulty seeing, even when wearing glasses/contacts?: No Does the patient have difficulty concentrating, remembering, or making decisions?: No Patient able to express need for assistance with ADLs?: Yes Does the patient have difficulty dressing or bathing?: No Independently performs ADLs?: Yes (appropriate for developmental age) Does the patient have difficulty walking or climbing stairs?: Yes Weakness of Legs: None Weakness of Arms/Hands: None  Permission Sought/Granted                  Emotional Assessment Appearance:: Appears stated age Attitude/Demeanor/Rapport: Gracious Affect (typically observed): Accepting Orientation: : Oriented to Self, Oriented to  Place, Oriented to  Time, Oriented to Situation Alcohol / Substance Use: Not Applicable Psych Involvement: No (comment)  Admission diagnosis:  Painful orthopaedic hardware [facility]) [T84.84XA] Patient Active Problem List   Diagnosis Date Noted   Diabetic osteomyelitis ([place]) 12/18/2020   Painful orthopaedic hardware ([place])  12/17/2020   Charcot's joint, right ankle and foot 05/14/2020   Pre-op evaluation 12/17/2019   Osteomyelitis of left foot ([place]) 03/02/2019   Diabetic ulcer of both feet ([place]) 08/08/2018   Right ankle swelling 02/08/2018   Obesity 05/18/2017   Hyperlipidemia 11/12/2014   Charcot foot due to diabetes mellitus ([place]) 11/06/2014   CKD (chronic kidney disease) stage 3, GFR 30-59 ml/min ([facility]) 02/02/2013   GERD (gastroesophageal reflux disease) 03/09/2012   Screening mammogram, encounter for 05/26/2010   Obstructive sleep apnea 05/26/2010   Normocytic anemia 01/12/2010   Diabetes mellitus type II, controlled ([place]) 06/17/2009   Essential hypertension 06/17/2009   PCP:  Lajean Manes, MD Pharmacy:   [facility] [id number] - [place], [place] - [address] [place] [address] Phone: [phone] Fax: [phone]     Social Determinants of Health (SDOH) Interventions    Readmission Risk Interventions No flowsheet data found.

## 2020-12-18 NOTE — Progress Notes (Addendum)
Subjective: 1 Day Post-Op Procedure(s) (LRB): Removal of deep implant right foot (Right)  Patient reports pain as mild to moderate.  Reports that she had a rough night due to nausea, which has improved this morning.  Denies fever, chills, SOB, CP.  Tolerating POs.  Admits to flatus.  Objective:   VITALS:  Temp:  [97.2 F (36.2 C)-98.4 F (36.9 C)] 97.5 F (36.4 C) (12/02 0412) Pulse Rate:  [52-79] 61 (12/02 0412) Resp:  [9-23] 18 (12/02 0412) BP: (118-163)/(52-87) 139/69 (12/02 0412) SpO2:  [96 %-100 %] 100 % (12/02 0412) Weight:  [120.2 kg] 120.2 kg (12/01 1034)  General: WDWN patient in NAD. Psych:  Appropriate mood and affect. Neuro:  A&O x 3, Moving all extremities, sensation intact to light touch HEENT:  EOMs intact Chest:  Even non-labored respirations Skin:  Dressing C/D/I, no rashes or lesions Extremities: warm/dry, no visible edema, erythema or echymosis.  No lymphadenopathy. Pulses: Popliteus 2+ MSK:  ROM: EHL/FHL, MMT: able to perform quad set, (-) Homan's    LABS Recent Labs    12/17/20 1045  HGB 10.2*  WBC 7.5  PLT 269   Recent Labs    12/17/20 1045  NA 138  K 4.1  CL 105  CO2 25  BUN 24*  CREATININE 1.36*  GLUCOSE 104*   No results for input(s): LABPT, INR in the last 72 hours.   Assessment/Plan: 1 Day Post-Op Procedure(s) (LRB): Removal of deep implant right foot (Right)  WBAT R LE Reinforce dressing prn Intra-op cultures pending, no growth thus far. ABX per ID. Appreciate Medicine team's assistance with patient. No DVT ppx has patient is WB Disp:  Home Patient likely here through weekend. D/C script for Oxycodone sent to patient's pharmacy after searching [place] PMP Aware.  Addendum:  Patient was evaluated by ID.  ID recommends 6 weeks of IV daptomyosin and cefepime.  She will f/u in 2 weeks with ID.  PICC line placed this evening.  Patient to be D/C'd home tomorrow morning.  D/C orders placed.  D/C scripts have been sent to patient's  outpatient pharmacy.  [name] PA-C [facility] Office:  [phone]

## 2020-12-18 NOTE — Progress Notes (Signed)
PHARMACY CONSULT NOTE FOR:  OUTPATIENT  PARENTERAL ANTIBIOTIC THERAPY (OPAT)  Indication: R-foot PJI Regimen: Daptomycin 700 mg IV every 24 hours + Cefepime 2g IV every 8 hours End date: 01/27/21  IV antibiotic discharge orders are pended. To discharging provider:  please sign these orders via discharge navigator,  Select New Orders & click on the button choice - Manage This Unsigned Work.     Thank you for allowing pharmacy to be a part of this patient's care.  Georgina Pillion, PharmD, BCPS Infectious Diseases Clinical Pharmacist 12/18/2020 11:49 AM   **Pharmacist phone directory can now be found on [email or web address] ([email or web address]).  Listed under [facility].

## 2020-12-18 NOTE — Progress Notes (Signed)
Initial Nutrition Assessment  DOCUMENTATION CODES:   Not applicable  INTERVENTION:  - Encourage PO intake  - Glucerna Shake po once daily, each supplement provides 220 kcal and 10 grams of protein  - Afternoon snack  - Provided "Plate Method" and "Tips for Adding Protein Nutrition Therapy" in AVS   NUTRITION DIAGNOSIS:   Increased nutrient needs related to post-op healing as evidenced by estimated needs.  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 PMH includes arthritis, CKD III, T2DM, HTN, GERD, and hyperlipidemia.  Pt reports PTA she has been eating well and being mindful about her diet as managing her T2DM and HTN is important to her. She typically eats breakfast, a light lunch, and dinner consisting of coffee, mini muffins, 4 servings of fruit a day, baked and broiled meats, grilled chicken salad and lots of vegetables. She will drink 100% cranberry juice or pineapple juice, water, and 1-2 Glucerna's a day. She does not eat after 7P as she has GERD and if she eats to late, she notices a flare up. The past few weeks she reports having a decreased appetite d/t antibiotics that she has been. Yesterday morning she did not eat breakfast because she was experiencing n/v secondary to surgery and medications. She ate 100% of her breakfast this morning and enjoyed it.   Spoke with pt about the importance of including all food groups in her diet, including whole grains as she does not usually include many grains in her diet as this is something she does not enjoy eating too much. We discussed effects of fruit juices on blood sugar and opting for fruit first. We also discussed choosing  lean proteins for lower saturated fat content. She requested information in the AVS on healthy protein options.  She reports a usual body weight 250-265 lbs and endorses a steady weight loss over the past 6 years but no significant recent weight loss.   Over the past 7 months since her surgery in April 2022, she has been working with PT and becoming more mobile, using a cane for assistance until the recent decline d/t R foot pain.  Medications: colace, SSI, tradjenta, glucophage, protonix, senna, IV abx  Labs: Cr 1.21, CBG's 90-162 x24 hours, HgbA1c 6.8%  NUTRITION - FOCUSED PHYSICAL EXAM:  Flowsheet Row Most Recent Value  Orbital Region No depletion  Upper Arm Region No depletion  Thoracic and Lumbar Region No depletion  Buccal Region No depletion  Temple Region Mild depletion  Clavicle Bone Region Mild depletion  Clavicle and Acromion Bone Region No depletion  Scapular Bone Region No depletion  Dorsal Hand No depletion  Patellar Region Mild depletion  Anterior Thigh Region Mild depletion  Posterior Calf Region No depletion  Edema (RD Assessment) Mild  [RLE]  Hair Reviewed  Eyes Reviewed  Mouth Reviewed  Skin Reviewed  Nails Reviewed       Diet Order:   Diet Order             Diet Carb Modified Fluid consistency: Thin; Room service appropriate? Yes  Diet effective now                   EDUCATION NEEDS:  Education needs have been addressed  Skin:  Skin Assessment: Skin Integrity Issues: Skin Integrity Issues:: Incisions Incisions: R foot  Last BM:  PTA  Height:   Ht Readings from Last 1 Encounters:  12/17/20 5\' 9"  (1.753 m)    Weight:   Wt Readings from Last 1 Encounters:  12/17/20 120.2 kg    Ideal Body Weight:  65.9 kg  BMI:  Body mass index is 39.13 kg/m.  Estimated Nutritional Needs:   Kcal:  1800-2000  Protein:  95-105g  Fluid:  >/=1.8L  14/01/22, RDN, LDN Clinical Nutrition65.9

## 2020-12-18 NOTE — Progress Notes (Signed)
Pharmacy Antibiotic Note  [name] is a 65 y.o. female admitted on 12/17/2020 with R-foot wound/PJI - now s/p I&D and removal of hardware. Pharmacy has been consulted for Daptomycin + Cefepime dosing.   Plan: - D/c Vanc + Zosyn - Start Daptomycin 8 mg/kg (AdjBW d/t BMI>30) - Start Cefepime 2g IV every 8 hours - Will continue to follow renal function, culture results, LOT, and antibiotic de-escalation plans   Height: 5\' 9"  (175.3 cm) Weight: 120.2 kg (265 lb) IBW/kg (Calculated) : 66.2  Temp (24hrs), Avg:97.7 F (36.5 C), Min:97.2 F (36.2 C), Max:98.5 F (36.9 C)  Recent Labs  Lab 12/17/20 1045 12/18/20 0720  WBC 7.5 11.0*  CREATININE 1.36* 1.21*    Estimated Creatinine Clearance: 64.2 mL/min (A) (by C-G formula based on SCr of 1.21 mg/dL (H)).    Allergies  Allergen Reactions   Codeine Itching and Swelling    swelling in throat   Pravastatin Other (See Comments)    cramps   Latex Rash    Antimicrobials this admission: Cefazolin pre-op x 1 Vancomycin 12/1 >> 12/2 Zosyn 12/1 >> 12/2 Daptomycin 12/2 >> Cefepime 12/2 >>  Dose adjustments this admission: N/a  Microbiology results: 12/1 R-foot tissue cx >>  Thank you for allowing pharmacy to be a part of this patient's care.  14/1, PharmD, BCPS Clinical Pharmacist 12/18/2020 11:42 AM   **Pharmacist phone directory can now be found on [email or web address] ([email or web address]).  Listed under [facility].

## 2020-12-18 NOTE — Discharge Summary (Signed)
Physician Discharge Summary  Patient ID: KATIEJO GILROY MRN: [id number] DOB/AGE: 06/07/1955 65 y.o.  Admit date: 12/17/2020 Discharge date: 12/19/2020  Admission Diagnoses: R foot diabetic osteomyelitis, uncontrolled type II diabetes, HTN, painful orthopedic hardware, CKD stage 3; hx of normocytic anemia, obstructive sleep apnea, GERD, charcot foot neuroarthropathy, hyperlipidemia, obesity, diabetic ulcer of bilateral foot.  Discharge Diagnoses:  Principal Problem:   Painful orthopaedic hardware [facility]) Active Problems:   Diabetes mellitus type II, controlled ([place])   Essential hypertension   CKD (chronic kidney disease) stage 3, GFR 30-59 ml/min (HCC)   Diabetic osteomyelitis ([place]) Same as above  Discharged Condition: stable  Hospital Course: Patient presented to [facility] on 12/18/20 for elective R ankle painful hardware remove and I&D of right foot abscess by Dr. [name] Simmer.  Intra-op cultures were obtained.  The patient tolerated the procedure well without complication.  She was then admitted to the hospital.  She was emperically placed on vanc and zosyn while awaiting cultures.  ID and hospitalist teams were consulted. Early intra-op cultures demonstrate staphylococcus epidermidis.  ID recommends 6 weeks of IV daptomyocin and cefepime.  PICC line was ordered and placed. Patient will be D/C'd home on 12/19/20.  She tolerated her stay well without complication.    Consults: ID and hospitalist service  Significant Diagnostic Studies: microbiology: Intra-op culture positive for staphyloccus epidermidis.  Treatments: IV hydration, antibiotics: Ancef, vancomycin, Zosyn, and daptomyocin and cefepime, analgesia: acetaminophen, Dilaudid, and oxycodone, cardiac meds: lisinopril (Zestril), anticoagulation: ASA and lovenox, insulin: Humalog, and surgery: as stated above  Discharge Exam: Blood pressure 120/67, pulse 72, temperature 98.3 F (36.8 C), temperature source Oral, resp. rate 16,  height 5' 9"  (1.753 m), weight 120.2 kg, SpO2 98 %. General: WDWN patient in NAD. Psych:  Appropriate mood and affect. Neuro:  A&O x 3, Moving all extremities, sensation intact to light touch HEENT:  EOMs intact Chest:  Even non-labored respirations Skin:  Dressing C/D/I, no rashes or lesions Extremities: warm/dry, no visible edema, erythema or echymosis.  No lymphadenopathy. Pulses: Popliteus 2+ MSK:  ROM: EHL/FHL intact, MMT: able to perform quad set, (-) Homan's   Disposition: Discharge disposition: 06-Home-Health Care Svc      Discharge Instructions     [facility] pharmacist to adjust dose for Vancomycin, Aminoglycosides and other anti-infective therapies as requested by physician.   Complete by: As directed    [facility] to provide Cath Flo 77m   Complete by: As directed    Administer for PICC line occlusion and as ordered by physician for other access device issues.   Anaphylaxis Kit: Provided to treat any anaphylactic reaction to the medication being provided to the patient if First Dose or when requested by physician   Complete by: As directed    Epinephrine 169mml vial / amp: Administer 0.35m25m0.35ml9mubcutaneously once for moderate to severe anaphylaxis, nurse to call physician and pharmacy when reaction occurs and call 911 if needed for immediate care   Diphenhydramine 50mg635mIV vial: Administer 25-50mg 7mM PRN for first dose reaction, rash, itching, mild reaction, nurse to call physician and pharmacy when reaction occurs   Sodium Chloride 0.9% NS 500ml I14mdminister if needed for hypovolemic blood pressure drop or as ordered by physician after call to physician with anaphylactic reaction   Call MD / Call 911   Complete by: As directed    If you experience chest pain or shortness of breath, CALL 911 and be transported to the hospital emergency room.  If you develope a fever above 101 F, pus (white drainage) or increased drainage or redness at the  wound, or calf pain, call your surgeon's office.   Change dressing on IV access line weekly and PRN   Complete by: As directed    Constipation Prevention   Complete by: As directed    Drink plenty of fluids.  Prune juice may be helpful.  You may use a stool softener, such as Colace (over the counter) 100 mg twice a day.  Use MiraLax (over the counter) for constipation as needed.   Diet - low sodium heart healthy   Complete by: As directed    Flush IV access with Sodium Chloride 0.9% and Heparin 10 units/ml or 100 units/ml   Complete by: As directed    Home infusion instructions - [facility]   Complete by: As directed    Instructions: Flush IV access with Sodium Chloride 0.9% and Heparin 10units/ml or 100units/ml   Change dressing on IV access line: Weekly and PRN   Instructions Cath Flo 31m: Administer for PICC Line occlusion and as ordered by physician for other access device   [facility] pharmacist to adjust dose for: Vancomycin, Aminoglycosides and other anti-infective therapies as requested by physician   Increase activity slowly as tolerated   Complete by: As directed    Method of administration may be changed at the discretion of home infusion pharmacist based upon assessment of the patient and/or caregiver's ability to self-administer the medication ordered   Complete by: As directed    Post-operative opioid taper instructions:   Complete by: As directed    POST-OPERATIVE OPIOID TAPER INSTRUCTIONS: It is important to wean off of your opioid medication as soon as possible. If you do not need pain medication after your surgery it is ok to stop day one. Opioids include: Codeine, Hydrocodone(Norco, Vicodin), Oxycodone(Percocet, oxycontin) and hydromorphone amongst others.  Long term and even short term use of opiods can cause: Increased pain response Dependence Constipation Depression Respiratory depression And more.  Withdrawal symptoms can include Flu  like symptoms Nausea, vomiting And more Techniques to manage these symptoms Hydrate well Eat regular healthy meals Stay active Use relaxation techniques(deep breathing, meditating, yoga) Do Not substitute Alcohol to help with tapering If you have been on opioids for less than two weeks and do not have pain than it is ok to stop all together.  Plan to wean off of opioids This plan should start within one week post op of your joint replacement. Maintain the same interval or time between taking each dose and first decrease the dose.  Cut the total daily intake of opioids by one tablet each day Next start to increase the time between doses. The last dose that should be eliminated is the evening dose.      Weight bearing as tolerated   Complete by: As directed    Laterality: right   Extremity: Lower      Allergies as of 12/18/2020       Reactions   Codeine Itching, Swelling   swelling in throat   Pravastatin Other (See Comments)   cramps   Latex Rash        Medication List     STOP taking these medications    doxycycline 100 MG tablet Commonly known as: VIBRA-TABS   rivaroxaban 10 MG Tabs tablet Commonly known as: Xarelto       TAKE these medications    Accu-Chek Softclix Lancets lancets Use to  check blood glucose up to three times a day. Dx code E 11.9   acetaminophen 500 MG tablet Commonly known as: TYLENOL Take 500-1,000 mg by mouth every 6 (six) hours as needed for moderate pain or headache.   aspirin EC 81 MG tablet Take 81 mg by mouth daily. Swallow whole.   atorvastatin 40 MG tablet Commonly known as: LIPITOR TAKE ONE TABLET BY MOUTH DAILY   BAG BALM EX Apply 1 application topically daily.   Biotin 5000 MCG Caps Take 5,000 mcg by mouth daily.   brimonidine-timolol 0.2-0.5 % ophthalmic solution Commonly known as: Combigan Place 1 drop into both eyes every 12 (twelve) hours. Place one drop to both eyes twice daily for glaucoma What changed:   when to take this additional instructions   CALCIUM 600+D PO Take 600 mg by mouth daily.   calcium carbonate 500 MG chewable tablet Commonly known as: TUMS - dosed in mg elemental calcium Chew 500-1,000 mg by mouth daily as needed for indigestion or heartburn.   ceFEPime  IVPB Commonly known as: MAXIPIME Inject 2 g into the vein every 8 (eight) hours. Indication:  R-foot osteo/PJI First Dose: Yes Last Day of Therapy:  01/28/21 Labs - Once weekly:  CBC/D and BMP, Labs - Every other week:  ESR and CRP Method of administration: IV Push Method of administration may be changed at the discretion of home infusion pharmacist based upon assessment of the patient and/or caregiver's ability to self-administer the medication ordered.   COLLAGEN PO Take 100 mg by mouth daily.   cycloSPORINE 0.05 % ophthalmic emulsion Commonly known as: RESTASIS Place 1 drop into both eyes 2 (two) times daily. 8 am and 1700   daptomycin  IVPB Commonly known as: CUBICIN Inject 700 mg into the vein daily. Indication:  R-foot PJI/osteo First Dose: Yes Last Day of Therapy:  01/28/21 Labs - Once weekly:  CBC/D, BMP, and CPK Labs - Every other week:  ESR and CRP Method of administration: IV Push Method of administration may be changed at the discretion of home infusion pharmacist based upon assessment of the patient and/or caregiver's ability to self-administer the medication ordered.   docusate sodium 100 MG capsule Commonly known as: Colace Take 1 capsule (100 mg total) by mouth 2 (two) times daily. While taking narcotic pain medicine. What changed: Another medication with the same name was added. Make sure you understand how and when to take each.   docusate sodium 100 MG capsule Commonly known as: Colace Take 1 capsule (100 mg total) by mouth 2 (two) times daily. While taking narcotic pain medicine. What changed: You were already taking a medication with the same name, and this prescription was added.  Make sure you understand how and when to take each.   ferrous sulfate 325 (65 FE) MG tablet Take 1 tablet (325 mg total) by mouth daily with breakfast.   glucose blood test strip Commonly known as: Accu-Chek Aviva Use to check blood glucose up to three times a day. Dx code E 11.9   [name] Generic drug: SitaGLIPtin-MetFORMIN HCl TAKE ONE TABLET BY MOUTH TWICE A DAY   lisinopril 10 MG tablet Commonly known as: ZESTRIL Take 1 tablet (10 mg total) by mouth daily.   methocarbamol 500 MG tablet Commonly known as: ROBAXIN Take 500 mg by mouth every 6 (six) hours as needed for pain.   multivitamin tablet Take 1 tablet by mouth daily. 50 plus   oxyCODONE 5 MG immediate release tablet  Commonly known as: Roxicodone Take 1 tablet (5 mg total) by mouth every 4 (four) hours as needed for up to 3 days.   pantoprazole 20 MG tablet Commonly known as: PROTONIX TAKE ONE TABLET BY MOUTH TWICE A DAY   REFRESH OP Place 1 drop into both eyes daily as needed (dry eyes).   Rocklatan 0.02-0.005 % Soln Generic drug: Netarsudil-Latanoprost Place 1 drop into both eyes daily at 10 pm.   senna 8.6 MG Tabs tablet Commonly known as: SENOKOT Take 2 tablets (17.2 mg total) by mouth 2 (two) times daily. What changed: Another medication with the same name was added. Make sure you understand how and when to take each.   senna 8.6 MG Tabs tablet Commonly known as: SENOKOT Take 2 tablets (17.2 mg total) by mouth 2 (two) times daily. What changed: You were already taking a medication with the same name, and this prescription was added. Make sure you understand how and when to take each.   Vitamin A 2400 MCG (8000 UT) Tabs Take 8,000 Units by mouth daily.   vitamin B-12 500 MCG tablet Commonly known as: CYANOCOBALAMIN Take 500 mcg by mouth daily.   vitamin C 1000 MG tablet Take 1,000 mg by mouth daily. Rose hips   Vitamin D 50 MCG (2000 UT) tablet Take 2,000 Units by mouth  daily.   vitamin E 180 MG (400 UNITS) capsule Take 400 Units by mouth daily.   Zinc 50 MG Caps Take 50 mg by mouth daily.               Discharge Care Instructions  (From admission, onward)           Start     Ordered   12/18/20 0000  Change dressing on IV access line weekly and PRN  (Home infusion instructions - [facility] )        12/18/20 1311   12/18/20 0000  Weight bearing as tolerated       Question Answer Comment  Laterality right   Extremity Lower      12/18/20 1711            Follow-up Information     [name] Simmer, MD Follow up in 2 week(s).   Specialty: Orthopedic Surgery Contact information: [address] [place] [address] [phone]                 Signed: Mohammed Kindle Office:  [phone]

## 2020-12-18 NOTE — Progress Notes (Signed)
Pt refusing CPAP for tonight, states she is being discharged in AM. Advised pt to notify for RT if she changes her mind.

## 2020-12-18 NOTE — Progress Notes (Signed)
Peripherally Inserted Central Catheter Placement  The IV Nurse has discussed with the patient and/or persons authorized to consent for the patient, the purpose of this procedure and the potential benefits and risks involved with this procedure.  The benefits include less needle sticks, lab draws from the catheter, and the patient may be discharged home with the catheter. Risks include, but not limited to, infection, bleeding, blood clot (thrombus formation), and puncture of an artery; nerve damage and irregular heartbeat and possibility to perform a PICC exchange if needed/ordered by physician.  Alternatives to this procedure were also discussed.  Bard Power PICC patient education guide, fact sheet on infection prevention and patient information card has been provided to patient /or left at bedside.    PICC Placement Documentation  PICC Single Lumen 12/18/20 Right Basilic 44 cm 0 cm (Active)  Indication for Insertion or Continuance of Line Home intravenous therapies (PICC only) 12/18/20 1707  Exposed Catheter (cm) 0 cm 12/18/20 1707  Site Assessment Clean;Dry;Intact 12/18/20 1707  Line Status Flushed;Saline locked;Blood return noted 12/18/20 1707  Dressing Type Transparent;Securing device 12/18/20 1707  Dressing Status Clean;Dry;Intact 12/18/20 1707  Antimicrobial disc in place? Yes 12/18/20 1707  Safety Lock Not Applicable 12/18/20 1707  Dressing Intervention Other (Comment) 12/18/20 1707  Dressing Change Due 12/25/20 12/18/20 1707       Annett Fabian 12/18/2020, 5:08 PM

## 2020-12-18 NOTE — Consult Note (Signed)
   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  Diagnosis: Hardware associated osteomyelitis  Culture Result: Pending  Allergies  Allergen Reactions   Codeine Itching and Swelling    swelling in throat   Pravastatin Other (See Comments)    cramps   Latex Rash    OPAT Orders Discharge antibiotics to be given via PICC line Discharge antibiotics: Daptomycin per pharmacy protocol and cefepime 2 g IV every 8 hours  Duration: 6 weeks  End Date:   PIC Care Per Protocol:  Home health RN for IV administration and teaching; PICC line care and labs.    Labs weekly while on IV antibiotics: __x CBC with differential __ BMP __x CMP __x CRP __x ESR  _x_ CK  _x_ Please pull PIC at completion of IV antibiotics __ Please leave PIC in place until doctor has seen patient or been notified  Fax weekly labs to [phone]  Clinic Follow Up Appt:  [name] has an appointment on 12/28/2020 with Dr. Van Dam at 145 PM   [facility] is located in the [facility] at  [address] in [place].  [address], which is located to the left of the elevators.  Phone: [phone]  Fax: [phone]  [email or web address]  She should arrive 15 to 30 minutes before her  appointment   Principal Problem:   Painful orthopaedic hardware (HCC)   Scheduled Meds:  aspirin EC  81 mg Oral Daily   atorvastatin  40 mg Oral Daily   cycloSPORINE  1 drop Both Eyes BID   docusate sodium  100 mg Oral BID   enoxaparin (LOVENOX) injection  60 mg Subcutaneous Q24H   insulin aspart  0-9 Units Subcutaneous TID WC   ketotifen  1 drop Both Eyes BID   linagliptin  5 mg Oral Daily   And   metFORMIN  500 mg Oral BID WC   lisinopril  10 mg Oral Daily   pantoprazole  20 mg Oral BID   senna  1 tablet Oral BID   Continuous Infusions:  sodium chloride 50 mL/hr at 12/18/20 0644   methocarbamol (ROBAXIN) IV     piperacillin-tazobactam (ZOSYN)  IV 3.375 g (12/18/20 0810)   vancomycin     PRN Meds:.bisacodyl, HYDROmorphone (DILAUDID) injection, methocarbamol (ROBAXIN) IV, metoCLOPramide **OR** metoCLOPramide (REGLAN) injection, ondansetron **OR** ondansetron (ZOFRAN) IV, oxyCODONE, polyethylene glycol  HPI: [name] is a 65 y.o. female with history of hypertension diabetes mellitus Charcot arthropathy who is followed by my partner Dr. Campbell in the past when she had had had evidence of calcaneal osteomyelitis.  She had a bone biopsy in February 2021 which grew Citrobacter Streptococcus mitis paralysis and Streptococcus anginosus.  She was treated with ceftriaxone and oral metronidazole and completed 8 weeks of therapy before switching to Keflex   and metronidazole and completing 12 weeks in its entirety.  After that she later developed drainage and an ulcer on her right foot for which she was followed by Dr. Robson and wound care.  Cultures from October 2021 grew and imipenem resistant Pseudomonas aeruginosa and an MRSA that was resistant to Bactrim.  Since then he subsequent underwent midfoot arthrodesis for Charcot arthropathy and this right foot.  She developed painful hardware at the site of arthrodesis.  She had been seen by Dr. Hewitt in the office and had placed  her on doxycycline.  Ultimately decision was made to take to the operating room to remove hardware.  In the operating room Dr. Hewitt found fluctuance dorsal to the ulcer and excised to this as well as the screw head that was identified and removed along with washer with debridement from the skin down through the subcutaneous tissue and into the bone of the screw tract.  Material at the dorsal wound was also excised and sent for culture.  Cultures are still incubating at this point in time the patient was placed on vancomycin and Zosyn.  I think a reasonable empiric regimen for now that will be safest in terms of avoiding nephrotoxicity would be daptomycin once daily along with cefepime likely 3 times daily.  If she fails to grow any organism I would continue this empiric regimen for 6 weeks total.  I spent 84 minutes with the patient including than 50% of the time in face to face counseling of the patient the nature of hardware associated infections and why we treat them as osteomyelitis with hardware from bones, reviewing antibiotic choices and potential adverse events associate with different antibiotics, personally reviewing plain film CT and MRI that been performed over the last several years cultures from her wounds in the past including operative cultures, CBC BMP along with  with review of medical records in preparation for the visit and during the visit and in coordination of her care.  My partner Dr. Comer will follow up on the cultures over the weekend and I have made an appointment for her to see me in follow-up.         Review of Systems: Review of Systems  Constitutional:  Negative for chills, fever, malaise/fatigue and weight loss.  HENT:  Negative for congestion and sore throat.   Eyes:  Negative for blurred vision and photophobia.  Respiratory:  Negative for cough, shortness of breath and wheezing.   Cardiovascular:  Negative for chest pain, palpitations and leg swelling.   Gastrointestinal:  Negative for abdominal pain, blood in stool, constipation, diarrhea, heartburn, melena, nausea and vomiting.  Genitourinary:  Negative for dysuria, flank pain and hematuria.  Musculoskeletal:  Positive for myalgias. Negative for back pain, falls and joint pain.  Skin:  Negative for itching and rash.  Neurological:  Negative for dizziness, focal weakness, loss of consciousness, weakness and headaches.  Endo/Heme/Allergies:  Does not bruise/bleed easily.  Psychiatric/Behavioral:  Negative for depression and suicidal ideas. The patient does not have insomnia.    Past Medical History:  Diagnosis Date   Arthritis    Chronic normocytic anemia    CKD (chronic kidney disease) stage 3, GFR 30-59 ml/min (HCC) 02/02/2013   Diabetes mellitus type II, controlled (HCC) 06/17/2009   Diabetic ulcer of both feet (HCC) 01/07/2010   Qualifier: Diagnosis of  By: Sidhu MD, Amanjot     Essential hypertension 06/17/2009   GERD (gastroesophageal reflux disease)    Glaucoma    H/O hiatal   hernia    Hyperlipidemia    MRSA (methicillin resistant Staphylococcus aureus)    Obstructive sleep apnea 05/26/2010    Social History   Tobacco Use   Smoking status: Former    Packs/day: 0.50    Years: 0.00    Pack years: 0.00    Types: Cigarettes    Quit date: 12/22/1972    Years since quitting: 48.0   Smokeless tobacco: Never  Vaping Use   Vaping Use: Never used  Substance Use Topics   Alcohol use: No   Drug use: No    Family History  Problem Relation Age of Onset   Diabetes Mother    Hypertension Mother    Dementia Mother    Heart disease Father    Stroke Father    Diabetes Father    Hypertension Father    Gout Father    Deep vein thrombosis Father    Breast cancer Cousin    Breast cancer Cousin    Allergies  Allergen Reactions   Codeine Itching and Swelling    swelling in throat   Pravastatin Other (See Comments)    cramps   Latex Rash    OBJECTIVE: Blood pressure  135/71, pulse 70, temperature 98.5 F (36.9 C), temperature source Oral, resp. rate 15, height 5' 9" (1.753 m), weight 120.2 kg, SpO2 99 %.  Physical Exam Constitutional:      General: She is not in acute distress.    Appearance: Normal appearance. She is well-developed. She is not ill-appearing or diaphoretic.  HENT:     Head: Normocephalic and atraumatic.     Right Ear: Hearing and external ear normal.     Left Ear: Hearing and external ear normal.     Nose: No nasal deformity or rhinorrhea.  Eyes:     General: No scleral icterus.    Extraocular Movements: Extraocular movements intact.     Conjunctiva/sclera: Conjunctivae normal.     Right eye: Right conjunctiva is not injected.     Left eye: Left conjunctiva is not injected.     Pupils: Pupils are equal, round, and reactive to light.  Neck:     Vascular: No JVD.  Cardiovascular:     Rate and Rhythm: Normal rate and regular rhythm.     Heart sounds: S1 normal and S2 normal.  Pulmonary:     Effort: Pulmonary effort is normal. No respiratory distress.     Breath sounds: No wheezing.  Abdominal:     General: There is distension.     Palpations: Abdomen is soft.  Musculoskeletal:        General: Normal range of motion.     Right shoulder: Normal.     Left shoulder: Normal.     Cervical back: Normal range of motion and neck supple.     Right hip: Normal.     Left hip: Normal.     Right knee: Normal.     Left knee: Normal.  Lymphadenopathy:     Head:     Right side of head: No submandibular, preauricular or posterior auricular adenopathy.     Left side of head: No submandibular, preauricular or posterior auricular adenopathy.     Cervical: No cervical adenopathy.     Right cervical: No superficial or deep cervical adenopathy.    Left cervical: No superficial or deep cervical adenopathy.  Skin:    General: Skin is warm and dry.     Coloration: Skin is not pale.     Findings:  No abrasion, bruising, ecchymosis, erythema,  lesion or rash.     Nails: There is no clubbing.  Neurological:     General: No focal deficit present.     Mental Status: She is alert and oriented to person, place, and time.  Psychiatric:        Attention and Perception: She is attentive.        Mood and Affect: Mood normal.        Speech: Speech normal.        Behavior: Behavior normal. Behavior is cooperative.        Thought Content: Thought content normal.        Judgment: Judgment normal.   Right foot is bandaged  Left foot has scar with him where she had prior ulceration.   Lab Results Lab Results  Component Value Date   WBC 11.0 (H) 12/18/2020   HGB 9.4 (L) 12/18/2020   HCT 29.2 (L) 12/18/2020   MCV 84.6 12/18/2020   PLT 257 12/18/2020    Lab Results  Component Value Date   CREATININE 1.21 (H) 12/18/2020   BUN 22 12/18/2020   NA 135 12/18/2020   K 3.9 12/18/2020   CL 104 12/18/2020   CO2 25 12/18/2020    Lab Results  Component Value Date   ALT 6 (A) 02/06/2015   AST 13 02/06/2015   ALKPHOS 65 02/06/2015   BILITOT <0.2 (L) 01/30/2013     Microbiology: Recent Results (from the past 240 hour(s))  SARS CORONAVIRUS 2 (TAT 6-24 HRS) Nasopharyngeal Nasopharyngeal Swab     Status: None   Collection Time: 12/15/20  2:08 PM   Specimen: Nasopharyngeal Swab  Result Value Ref Range Status   SARS Coronavirus 2 NEGATIVE NEGATIVE Final    Comment: (NOTE) SARS-CoV-2 target nucleic acids are NOT DETECTED.  The SARS-CoV-2 RNA is generally detectable in upper and lower respiratory specimens during the acute phase of infection. Negative results do not preclude SARS-CoV-2 infection, do not rule out co-infections with other pathogens, and should not be used as the sole basis for treatment or other patient management decisions. Negative results must be combined with clinical observations, patient history, and epidemiological information. The expected result is Negative.  Fact Sheet for  Patients: https://www.fda.gov/media/138098/download  Fact Sheet for Healthcare Providers: https://www.fda.gov/media/138095/download  This test is not yet approved or cleared by the [place] FDA and  has been authorized for detection and/or diagnosis of SARS-CoV-2 by FDA under an Emergency Use Authorization (EUA). This EUA will remain  in effect (meaning this test can be used) for the duration of the COVID-19 declaration under Se ction 564(b)(1) of the Act, 21 U.S.C. section 360bbb-3(b)(1), unless the authorization is terminated or revoked sooner.  Performed at [facility], [address] [place], [place] [address]   Aerobic/Anaerobic Culture w Gram Stain (surgical/deep wound)     Status: None (Preliminary result)   Collection Time: 12/17/20 12:53 PM   Specimen: Soft Tissue, Other  Result Value Ref Range Status   Specimen Description TISSUE  Final   Special Requests  RIGHT FOOT  Final   Gram Stain   Final    MODERATE WBC PRESENT,BOTH PMN AND MONONUCLEAR NO ORGANISMS SEEN    Culture   Final    CULTURE REINCUBATED FOR BETTER GROWTH Performed at [facility], [address] [place], [place] [address]    Report Status PENDING  Incomplete    Cornelius Van Dam, MD [facility]    Medical Group [phone] pager  12/18/2020, 11:25 AM   

## 2020-12-19 LAB — BASIC METABOLIC PANEL
Anion gap: 6 (ref 5–15)
BUN: 24 mg/dL — ABNORMAL HIGH (ref 8–23)
CO2: 25 mmol/L (ref 22–32)
Calcium: 8.4 mg/dL — ABNORMAL LOW (ref 8.9–10.3)
Chloride: 107 mmol/L (ref 98–111)
Creatinine, Ser: 1.42 mg/dL — ABNORMAL HIGH (ref 0.44–1.00)
GFR, Estimated: 41 mL/min — ABNORMAL LOW (ref >60)
Glucose, Bld: 98 mg/dL (ref 70–99)
Potassium: 4.2 mmol/L (ref 3.5–5.1)
Sodium: 138 mmol/L (ref 135–145)

## 2020-12-19 LAB — CBC
HCT: 27.3 % — ABNORMAL LOW (ref 36.0–46.0)
Hemoglobin: 8.6 g/dL — ABNORMAL LOW (ref 12.0–15.0)
MCH: 27.1 pg (ref 26.0–34.0)
MCHC: 31.5 g/dL (ref 30.0–36.0)
MCV: 86.1 fL (ref 80.0–100.0)
Platelets: 218 10*3/uL (ref 150–400)
RBC: 3.17 MIL/uL — ABNORMAL LOW (ref 3.87–5.11)
RDW: 16.2 % — ABNORMAL HIGH (ref 11.5–15.5)
WBC: 7.3 10*3/uL (ref 4.0–10.5)
nRBC: 0 % (ref 0.0–0.2)

## 2020-12-19 LAB — GLUCOSE, CAPILLARY
Glucose-Capillary: 83 mg/dL (ref 70–99)
Glucose-Capillary: 104 mg/dL — ABNORMAL HIGH (ref 70–99)
Glucose-Capillary: 106 mg/dL — ABNORMAL HIGH (ref 70–99)
Glucose-Capillary: 128 mg/dL — ABNORMAL HIGH (ref 70–99)

## 2020-12-19 LAB — SEDIMENTATION RATE: Sed Rate: 70 mm/hr — ABNORMAL HIGH (ref 0–22)

## 2020-12-19 LAB — C-REACTIVE PROTEIN: CRP: 0.9 mg/dL (ref <1.0)

## 2020-12-19 MED ORDER — CEFEPIME IV (FOR PTA / DISCHARGE USE ONLY): 2.0000 g | Freq: Two times a day (BID) | INTRAVENOUS | 0 refills | Status: AC

## 2020-12-19 NOTE — TOC Progression Note (Signed)
Transition of Care [facility]) - Progression Note    Patient Details  Name: [name] MRN: [id number] Date of Birth: Apr 07, 1955  Transition of Care [facility]) CM/SW Contact  Bess Kinds, RN Phone Number: [phone] 12/19/2020, 1:10 PM  Clinical Narrative:     Spoke with patient at the bedside along with MD on the phone. Discussed transition plans. Patient is home alone - lives in a boarding house but states that everyone there is like family and she has outside support. PT evaluation to be ordered by MD. [facility] orders requested. Discharge pending home infusion and HH scheduling. Patient verbalized understanding. TOC following for transition needs.   Expected Discharge Plan: Home w Home Health Services Barriers to Discharge: Continued Medical Work up  Expected Discharge Plan and Services Expected Discharge Plan: Home w Home Health Services   Discharge Planning Services: CM Consult   Living arrangements for the past 2 months: Single Family Home Expected Discharge Date: 12/19/20               DME Arranged: Other see comment (IV ABX therapy) DME Agency: Other - Comment ([facility]) Date DME Agency Contacted: 12/18/20 Time DME Agency Contacted: 1340 Representative spoke with at DME Agency: Pam HH Arranged: PT, RN HH Agency: [facility] Date [facility] Agency Contacted: 12/18/20 Time HH Agency Contacted: 1344 Representative spoke with at [facility] Agency: Kandee Keen   Social Determinants of Health (SDOH) Interventions    Readmission Risk Interventions No flowsheet data found.

## 2020-12-19 NOTE — Progress Notes (Signed)
HD#1 Subjective:  Overnight Events: No acute events Patient reports doing well today.  States her foot feels well and does not have expected pain.  She is eager for discharge today.  Her consult will be driving her home.  Otherwise denies any acute complaints.  Objective:  Vital signs in last 24 hours: Vitals:   12/18/20 1946 12/18/20 2351 12/19/20 0745 12/19/20 0814  BP: (!) 119/59 121/67 134/69 (!) 141/77  Pulse: 73 73 75 77  Resp: 17 18 15 16   Temp: 98.4 F (36.9 C) 98.2 F (36.8 C) 98.6 F (37 C) 97.8 F (36.6 C)  TempSrc:   Oral Oral  SpO2: 98% 100% 100% 100%  Weight:      Height:       Supplemental O2: Room Air SpO2: 100 % O2 Flow Rate (L/min): 2 L/min   Physical Exam:  Gen: Obese, well-appearing, sitting up in bed in good spirits CV: Regular rate and rhythm no murmurs Resp: No respiratory distress, symmetric bilateral chest rise, clear to auscultation Abd: soft, nontender, mildly distended, normal bowel sounds MSK: moves all extremities spontaneously, right foot in Ace wrap clean dressed, PICC line on right upper extremity Neuro: alert, oriented, answering questions appropriately Psych: normal affect    Filed Weights   12/15/20 1150 12/17/20 1034  Weight: 120.2 kg 120.2 kg     Intake/Output Summary (Last 24 hours) at 12/19/2020 0942 Last data filed at 12/19/2020 0624 Gross per 24 hour  Intake 1404.39 ml  Output --  Net 1404.39 ml    Net IO Since Admission: 1,745.19 mL [12/19/20 0942]  Pertinent Labs: CBC Latest Ref Rng & Units 12/19/2020 12/18/2020 12/17/2020  WBC 4.0 - 10.5 K/uL 7.3 11.0(H) 7.5  Hemoglobin 12.0 - 15.0 g/dL 14/01/2020) 5.4(O) 10.2(L)  Hematocrit 36.0 - 46.0 % 27.3(L) 29.2(L) 32.7(L)  Platelets 150 - 400 K/uL 218 257 269    CMP Latest Ref Rng & Units 12/19/2020 12/18/2020 12/17/2020  Glucose 70 - 99 mg/dL 98 14/01/2020) 350(K)  BUN 8 - 23 mg/dL 938(H) 22 82(X)  Creatinine 0.44 - 1.00 mg/dL 93(Z) 1.69(C) 7.89(F)  Sodium 135 - 145 mmol/L 138  135 138  Potassium 3.5 - 5.1 mmol/L 4.2 3.9 4.1  Chloride 98 - 111 mmol/L 107 104 105  CO2 22 - 32 mmol/L 25 25 25   Calcium 8.9 - 10.3 mg/dL 8.10(F) ) 9.2  Total Protein 6.0 - 8.3 g/dL - - -  Total Bilirubin 0.3 - 1.2 mg/dL - - -  Alkaline Phos 25 - 125 U/L - - -  AST 13 - 35 U/L - - -  ALT 7 - 35 U/L - - -    Assessment/Plan:   Principal Problem:   Painful orthopaedic hardware (HCC) Active Problems:   Diabetes mellitus type II, controlled (HCC)   Essential hypertension   CKD (chronic kidney disease) stage 3, GFR 30-59 ml/min (HCC)   Diabetic osteomyelitis (HCC)   Patient Summary: [name] is a 65 y.o. with a PMH of HLD, T2DM, HTN, CKD who was admitted for a deep implant removal and irrigation and debridement of a right medial midfoot diabetic ulcer by orthopedics.    Diabetic osteomyelitis Postop day 2.  Pain management per primary team. ID has also been consulted.  Surgical wound cultures positive for staph epidermidis.  Plan for 6 weeks IV antibiotics with cefepime and daptomycin.  Leukocytosis has resolved baseline sed rate of 68 and CRP of 1.2.  She had PICC placed on 12/2 tolerated procedure  well.  Plan for discharge home with home health PT and RN to assist with mobility and IV antibiotics. - continue antibiotics; 6 week course per ID, will follow-up with Dr. Tommy Medal on 12/12 -Has follow-up with orthopedics on 12/16 -Home health PT and RN set up - strict glucose control as below -We will arrange for hospital follow-up with [facility] clinic next week   Well controlled T2DM Blood sugars continue to be well controlled.  We will continue her home regimen at discharge - continue linagliptin and metformin which is hospital formulary equivalent for home [place]. - A1c 6.8   HTN Remains on normotensive continue lisinopril 10 mg daily   CKD Remained stable.   Normocytic anemia Hemoglobin remained stable no signs of bleeding.   Iona Beard, MD Internal Medicine  Resident PGY-2 Pager [phone] Please contact the on call pager after 5 pm and on weekends at [phone].

## 2020-12-19 NOTE — Progress Notes (Signed)
Dr. Odis Hollingshead notified of positive tissue culture results. No new orders given. Pt remains on IV antibiotics.

## 2020-12-19 NOTE — TOC Progression Note (Signed)
Transition of Care [facility]) - Progression Note    Patient Details  Name: [name] MRN: [id number] Date of Birth: 12-Feb-1955  Transition of Care [facility]) CM/SW Contact  Bess Kinds, RN Phone Number: [phone] 12/19/2020, 12:21 PM  Clinical Narrative:     Notified by nursing of patient needing teaching for IV antibiotics prior to discharge. Reached out to [facility] with [facility]. Arrangements made yesterday for transition home on Monday d/t patient needing additional physical therapy over the weekend in order to promote safe transition. Frances Furbish accepted referral for [facility] with start of care for Tuesday 12/6. Patient will need HH PT/RN orders with Face to Face at discharge. TOC following for transition needs.   Expected Discharge Plan: Home w Home Health Services Barriers to Discharge: Continued Medical Work up  Expected Discharge Plan and Services Expected Discharge Plan: Home w Home Health Services   Discharge Planning Services: CM Consult   Living arrangements for the past 2 months: Single Family Home Expected Discharge Date: 12/19/20               DME Arranged: Other see comment (IV ABX therapy) DME Agency: Other - Comment ([facility]) Date DME Agency Contacted: 12/18/20 Time DME Agency Contacted: 1340 Representative spoke with at DME Agency: Pam HH Arranged: PT, RN HH Agency: [facility] Date [facility] Agency Contacted: 12/18/20 Time HH Agency Contacted: 1344 Representative spoke with at [facility] Agency: Kandee Keen   Social Determinants of Health (SDOH) Interventions    Readmission Risk Interventions No flowsheet data found.

## 2020-12-19 NOTE — Progress Notes (Signed)
Subjective: 2 Days Post-Op Procedure(s) (LRB): Removal of deep implant right foot (Right)  Patient reports pain as mild.    Objective:   VITALS:  Temp:  [97.8 F (36.6 C)-98.6 F (37 C)] 97.8 F (36.6 C) (12/03 0814) Pulse Rate:  [72-77] 77 (12/03 0814) Resp:  [15-18] 16 (12/03 0814) BP: (114-141)/(55-77) 141/77 (12/03 0814) SpO2:  [98 %-100 %] 100 % (12/03 0814)  Neurovascular intact Sensation intact distally Intact pulses distally Dorsiflexion/Plantar flexion intact Incision: dressing C/D/I Compartment soft   LABS Recent Labs    12/17/20 1045 12/18/20 0720 12/19/20 0344  HGB 10.2* 9.4* 8.6*  WBC 7.5 11.0* 7.3  PLT 269 257 218   Recent Labs    12/18/20 0720 12/19/20 0344  NA 135 138  K 3.9 4.2  CL 104 107  CO2 25 25  BUN 22 24*  CREATININE 1.21* 1.42*  GLUCOSE 126* 98   No results for input(s): LABPT, INR in the last 72 hours.   Assessment/Plan: 2 Days Post-Op Procedure(s) (LRB): Removal of deep implant right foot (Right)  WBAT R LE Reinforce dressing prn ID recommends 6 weeks of IV daptomyosin and cefepime.  She will f/u in 2 weeks with ID.  PICC line placed yesterday evening.  Appreciate Medicine team's assistance with patient Disp:  Home D/C script for Oxycodone sent to patient's pharmacy after searching [place] PMP Aware.  Netta Cedars 12/19/2020, 10:06 AM

## 2020-12-20 LAB — GLUCOSE, CAPILLARY
Glucose-Capillary: 106 mg/dL — ABNORMAL HIGH (ref 70–99)
Glucose-Capillary: 106 mg/dL — ABNORMAL HIGH (ref 70–99)
Glucose-Capillary: 109 mg/dL — ABNORMAL HIGH (ref 70–99)

## 2020-12-20 MED ORDER — SODIUM CHLORIDE 0.9 % IV SOLN
2.0000 g | Freq: Two times a day (BID) | INTRAVENOUS | Status: DC
  Administered 2020-12-20: 16:00:00 2 g via INTRAVENOUS
  Filled 2020-12-20: qty 2

## 2020-12-20 MED ORDER — CALCIUM CARBONATE ANTACID 500 MG PO CHEW
1.0000 | CHEWABLE_TABLET | Freq: Two times a day (BID) | ORAL | Status: DC | PRN
  Administered 2020-12-20: 16:00:00 200 mg via ORAL
  Filled 2020-12-20: qty 1

## 2020-12-20 MED ORDER — HEPARIN SOD (PORK) LOCK FLUSH 100 UNIT/ML IV SOLN
250.0000 [IU] | INTRAVENOUS | Status: AC | PRN
  Administered 2020-12-20: 17:00:00 250 [IU]
  Filled 2020-12-20: qty 2.5

## 2020-12-20 NOTE — Progress Notes (Signed)
[name]  MRN: [id number] DOB/Age: January 03, 1956 65 y.o. Physician: Lynnea Maizes, M.D. 3 Days Post-Op Procedure(s) (LRB): Removal of deep implant right foot (Right)  Subjective: Patient resting comfortably in bed.  Anxious to go home this afternoon once she has completed her 2 PM antibiotic dose. Vital Signs Temp:  [97.5 F (36.4 C)-98 F (36.7 C)] 97.7 F (36.5 C) (12/04 0813) Pulse Rate:  [76-81] 78 (12/04 0813) Resp:  [15-18] 15 (12/04 0813) BP: (102-153)/(60-79) 153/76 (12/04 0813) SpO2:  [97 %-100 %] 100 % (12/04 0813)  Lab Results Recent Labs    12/18/20 0720 12/19/20 0344  WBC 11.0* 7.3  HGB 9.4* 8.6*  HCT 29.2* 27.3*  PLT 257 218   BMET Recent Labs    12/18/20 0720 12/19/20 0344  NA 135 138  K 3.9 4.2  CL 104 107  CO2 25 25  GLUCOSE 126* 98  BUN 22 24*  CREATININE 1.21* 1.42*  CALCIUM 8.8* 8.4*   INR  Date Value Ref Range Status  01/08/2010 1.23 0.00 - 1.49 Final     Exam  Right foot demonstrates bulky well-padded dressing.  No drainage.  Dressings are dry.  Impression:  Status post hardware removal right foot.  Plan Patient will be receiving long-term IV antibiotics.  Arrangements have been made.  Once This patient receives 2 PM IV antibiotic dose she will be ready for discharge to home.  Home health services have been arranged.  Follow-up with [name] in approximately 2 weeks, call for appointment.  Patient may be weightbearing as tolerated to the right lower extremity.  Postop shoe at bedside  Discharge summary in chart by [name], PA-C  [name] 12/20/2020, 9:55 AM   Contact # [phone]

## 2020-12-20 NOTE — Progress Notes (Signed)
Physical Therapy Note  PT eval complete with full note to follow;  Recommend Home with HHPT/RN follow up for transition out of hospital;  Walking well with boot, progressive amb and stair training complete;  Questions solicited and answered OK for dc home from PT standpoint   Van Clines, PT  Acute Rehabilitation Services Pager [phone] Office [phone]

## 2020-12-20 NOTE — Evaluation (Signed)
Physical Therapy Evaluation Patient Details Name: [name] MRN: [id number] DOB: 1955-05-21 Today's Date: 12/20/2020  History of Present Illness  Admitted for a deep implant removal and I&D of R midfoot diabetic ulcer, s/p procedure 12/1, WBAT RLE in postop boot  Clinical Impression  Patient evaluated by Physical Therapy with no further acute PT needs identified. All education has been completed and the patient has no further questions. Managing well with ambulation in boot; Questions solicited and answered; OK for dc home from PT standpoint; See below for any follow-up Physical Therapy or equipment needs. PT is signing off. Thank you for this referral.        Recommendations for follow up therapy are one component of a multi-disciplinary discharge planning process, led by the attending physician.  Recommendations may be updated based on patient status, additional functional criteria and insurance authorization.  Follow Up Recommendations Home health PT (as well as HHRN)    Assistance Recommended at Discharge PRN  Functional Status Assessment Patient has had a recent decline in their functional status and demonstrates the ability to make significant improvements in function in a reasonable and predictable amount of time.  Equipment Recommendations  None recommended by PT    Recommendations for Other Services       Precautions / Restrictions Precautions Precautions: None Required Braces or Orthoses: Other Brace Other Brace: post op boot Restrictions Weight Bearing Restrictions: No      Mobility  Bed Mobility Overal bed mobility: Modified Independent                  Transfers Overall transfer level: Modified independent Equipment used: Rolling walker (2 wheels)                    Ambulation/Gait Ambulation/Gait assistance: Supervision;Modified independent (Device/Increase time) Gait Distance (Feet): 300 Feet Assistive device: Rolling walker (2  wheels) Gait Pattern/deviations: Step-through pattern       General Gait Details: Walking well with RW; notably winded at end of walk, but it was the first time she has been walking in the hallway since surgery  Stairs Stairs: Yes Stairs assistance: Supervision Stair Management: Two rails;Step to pattern;Forwards Number of Stairs: 3 General stair comments: Cues for sequence  Wheelchair Mobility    Modified Rankin (Stroke Patients Only)       Balance Overall balance assessment: No apparent balance deficits (not formally assessed)                                           Pertinent Vitals/Pain Pain Assessment: No/denies pain    Home Living Family/patient expects to be discharged to:: Group home Living Arrangements: Non-relatives/Friends Available Help at Discharge: Other (Comment) (reports has lots of help from housemates) Type of Home: House Home Access: Stairs to enter Entrance Stairs-Rails: Right;Left;Can reach both Entrance Stairs-Number of Steps: 4   Home Layout: One level Home Equipment: Crutches;Tub bench;Rolling Walker (2 wheels);BSC/3in1 Additional Comments: Well-equipped from prior surgeries    Prior Function Prior Level of Function : Independent/Modified Independent             Mobility Comments: RW prn ADLs Comments: uses tub bench     Hand Dominance   Dominant Hand: Right    Extremity/Trunk Assessment   Upper Extremity Assessment Upper Extremity Assessment: Overall WFL for tasks assessed    Lower Extremity Assessment Lower Extremity Assessment: RLE deficits/detail  RLE Deficits / Details: Foot/ankle in ace wrap; able to don postop boot without difficulty       Communication   Communication: No difficulties  Cognition Arousal/Alertness: Awake/alert Behavior During Therapy: WFL for tasks assessed/performed Overall Cognitive Status: Within Functional Limits for tasks assessed                                           General Comments General comments (skin integrity, edema, etc.): Had been managing in the room and going to the bathroom without difficulty    Exercises     Assessment/Plan    PT Assessment All further PT needs can be met in the next venue of care  PT Problem List Decreased strength;Decreased activity tolerance       PT Treatment Interventions      PT Goals (Current goals can be found in the Care Plan section)  Acute Rehab PT Goals Patient Stated Goal: to be able to go home after afternoon antibiotics PT Goal Formulation: All assessment and education complete, DC therapy    Frequency     Barriers to discharge        Co-evaluation               AM-PAC PT "6 Clicks" Mobility  Outcome Measure Help needed turning from your back to your side while in a flat bed without using bedrails?: None Help needed moving from lying on your back to sitting on the side of a flat bed without using bedrails?: None Help needed moving to and from a bed to a chair (including a wheelchair)?: None Help needed standing up from a chair using your arms (e.g., wheelchair or bedside chair)?: None Help needed to walk in hospital room?: A Little Help needed climbing 3-5 steps with a railing? : A Little 6 Click Score: 22    End of Session Equipment Utilized During Treatment: Other (comment) (R postop boot) Activity Tolerance: Patient tolerated treatment well Patient left: in chair;with call bell/phone within reach Nurse Communication: Mobility status PT Visit Diagnosis: Other abnormalities of gait and mobility (R26.89)    Time: 5844-1712 PT Time Calculation (min) (ACUTE ONLY): 24 min   Charges:   PT Evaluation $PT Eval Low Complexity: 1 Low PT Treatments $Gait Training: 8-22 mins        Roney Marion, PT  Acute Rehabilitation Services Pager [phone] Office [phone]   Colletta [place] 12/20/2020, 2:11 PM

## 2020-12-20 NOTE — Progress Notes (Signed)
    HD#2 Subjective:  Overnight Events: No acute events  Continues to do well today no acute complaints. States she is ready to go home when antibiotics are completed today. She will have HHRN come tomorrow to help with IV antibiotics.  Objective:  Vital signs in last 24 hours: Vitals:   12/19/20 1640 12/19/20 2025 12/20/20 0616 12/20/20 0813  BP: 102/60 131/79 (!) 143/70 (!) 153/76  Pulse: 81 79 76 78  Resp: 16 18 17 15   Temp: 98 F (36.7 C) (!) 97.5 F (36.4 C) 97.8 F (36.6 C) 97.7 F (36.5 C)  TempSrc: Oral Oral  Oral  SpO2: 97% 100% 100% 100%  Weight:      Height:       Supplemental O2: Room Air SpO2: 100 % O2 Flow Rate (L/min): 2 L/min   Physical Exam:  Gen: Obese, well-appearing, in no acute distress Abd: soft, nontender, no distention  MSK: moves all extremities spontaneously, right foot in Ace wrap clean dressed, PICC line on right upper extremity Neuro: Aox4, no focal deficients   Assessment/Plan:   Principal Problem:   Painful orthopaedic hardware [facility]) Active Problems:   Diabetes mellitus type II, controlled (HCC)   Essential hypertension   CKD (chronic kidney disease) stage 3, GFR 30-59 ml/min (HCC)   Diabetic osteomyelitis (HCC)   Patient Summary: [name] is a 65 y.o. with a PMH of HLD, T2DM, HTN, CKD who was admitted for a deep implant removal and irrigation and debridement of a right medial midfoot diabetic ulcer by orthopedics.    Diabetic osteomyelitis Postop day 2.  Pain management per primary team. ID has also been consulted.  Surgical wound cultures positive for staph epidermidis.  Plan for 6 weeks IV antibiotics with cefepime and daptomycin.  Leukocytosis has resolved baseline sed rate of 68 and CRP of 1.2.  She had PICC placed on 12/2 tolerated procedure well. Slight increase in Cr.to 1.4 may berelated to getting vanc/zosyn. With decrease Cefepime to q12 renal dosing until she can follow up BMP. Stayed overnight as home heath unable to  come to home over weekend. Plan for discharge this afternoon after her antibiotics today. HHRN to come to home tomorrow on 12/5 to continue outpatient IV antibiotics. - continue cefepime 2 g twice daily and daptomycin 700mg  daily; 6 week course per ID, will follow-up with Dr. 14/5 on 12/12 -Has follow-up with orthopedics on 12/16 -Home health PT and RN set up - strict glucose control as below -We will arrange for hospital follow-up with [facility] clinic next week   Well controlled T2DM Blood sugars continue to be well controlled.  We will continue her home regimen at discharge - continue linagliptin and metformin which is hospital formulary equivalent for home Janumet. - A1c 6.8   HTN Remains on normotensive continue lisinopril 10 mg daily   CKD Mild elevation to 1.4 yesterday. Will renally dose cefepime. She will follow with ID to continue IV antibiotic management.    Normocytic anemia Hemoglobin remained stable no signs of bleeding.  1/17, MD Internal Medicine Resident PGY-2 Pager [phone] Please contact the on call pager after 5 pm and on weekends at [phone].

## 2020-12-20 NOTE — TOC Progression Note (Addendum)
Transition of Care [facility]) - Progression Note    Patient Details  Name: [name] MRN: [id number] Date of Birth: [birth year]  Transition of [facility]) CM/SW Contact  Bess Kinds, RN Phone Number: [phone] 12/20/2020, 9:43 AM  Clinical Narrative:     Notified by Elita Quick with [employer] that she will be doing teaching with patient at 11:30 for home antibiotics. Home antibiotics to be delivered to patient's bedside today. Patient needs to receive her 2pm antibiotic dose in the hospital today, then can discharge home. [facility] to do home visit tomorrow. Spoke with patient on her cell phone to update on plan - patient in agreement and stated that she will call a friend of hers to ask about transportation home. TOC following for transition needs.    Expected Discharge Plan: Home w Home Health Services Barriers to Discharge: Continued Medical Work up  Expected Discharge Plan and Services Expected Discharge Plan: Home w Home Health Services   Discharge Planning Services: CM Consult   Living arrangements for the past 2 months: Single Family Home Expected Discharge Date: 12/19/20               DME Arranged: Other see comment (IV ABX therapy) DME Agency: Other - Comment ([facility]) Date DME Agency Contacted: 12/18/20 Time DME Agency Contacted: 1340 Representative spoke with at DME Agency: Pam HH Arranged: PT, RN HH Agency: [facility] Date [facility] Agency Contacted: 12/18/20 Time HH Agency Contacted: 1344 Representative spoke with at [facility] Agency: Kandee Keen   Social Determinants of Health (SDOH) Interventions    Readmission Risk Interventions No flowsheet data found.

## 2020-12-21 LAB — AEROBIC/ANAEROBIC CULTURE W GRAM STAIN (SURGICAL/DEEP WOUND)

## 2020-12-22 ENCOUNTER — Telehealth: Payer: Self-pay | Admitting: *Deleted

## 2020-12-22 MED ORDER — ACCU-CHEK AVIVA PLUS W/DEVICE KIT: PACK | 0 refills | Status: DC

## 2020-12-22 NOTE — Telephone Encounter (Signed)
Call from Kevin Fenton, PT with [facility] - stated he did a PT evaluation,stated pt had surgery. BP on arrival was 180/100,pt asymptomatic then came down to 160/90. Stated pt took Lisinopril about 2 hrs before his arrival. Pt c/o GERD after eating - advised to sit up after eating. Also stated pt cannot find her diabetic meter; she will need another (accu-chek aviva). Thanks

## 2020-12-23 ENCOUNTER — Other Ambulatory Visit: Payer: Self-pay

## 2020-12-23 MED ORDER — ACCU-CHEK AVIVA VI STRP
ORAL_STRIP | 12 refills | Status: DC
Start: 2020-12-23 — End: 2022-01-13

## 2020-12-23 MED ORDER — ACCU-CHEK SOFTCLIX LANCETS MISC: 12 refills | Status: DC

## 2020-12-23 MED ORDER — ACCU-CHEK AVIVA PLUS W/DEVICE KIT: PACK | 0 refills | Status: AC

## 2020-12-24 ENCOUNTER — Ambulatory Visit (INDEPENDENT_AMBULATORY_CARE_PROVIDER_SITE_OTHER): Payer: Medicare Other | Admitting: Internal Medicine

## 2020-12-24 ENCOUNTER — Encounter: Payer: Self-pay | Admitting: Internal Medicine

## 2020-12-24 VITALS — BP 129/63 | HR 77 | Temp 98.6°F | Wt 246.2 lb

## 2020-12-24 DIAGNOSIS — E1169 Type 2 diabetes mellitus with other specified complication: Secondary | ICD-10-CM | POA: Diagnosis not present

## 2020-12-24 DIAGNOSIS — I10 Essential (primary) hypertension: Secondary | ICD-10-CM | POA: Diagnosis not present

## 2020-12-24 DIAGNOSIS — E785 Hyperlipidemia, unspecified: Secondary | ICD-10-CM | POA: Diagnosis not present

## 2020-12-24 DIAGNOSIS — R710 Precipitous drop in hematocrit: Secondary | ICD-10-CM

## 2020-12-24 DIAGNOSIS — E2839 Other primary ovarian failure: Secondary | ICD-10-CM | POA: Diagnosis not present

## 2020-12-24 DIAGNOSIS — Z23 Encounter for immunization: Secondary | ICD-10-CM

## 2020-12-24 DIAGNOSIS — E349 Endocrine disorder, unspecified: Secondary | ICD-10-CM

## 2020-12-24 DIAGNOSIS — M869 Osteomyelitis, unspecified: Secondary | ICD-10-CM

## 2020-12-24 DIAGNOSIS — Z0000 Encounter for general adult medical examination without abnormal findings: Secondary | ICD-10-CM

## 2020-12-24 MED ORDER — METRONIDAZOLE 500 MG PO TABS: 500.0000 mg | ORAL_TABLET | Freq: Two times a day (BID) | ORAL | 0 refills | Status: DC

## 2020-12-24 MED ORDER — ADULT BLOOD PRESSURE CUFF LG KIT: PACK | 0 refills | Status: DC

## 2020-12-24 NOTE — Assessment & Plan Note (Signed)
Recent A1c at goal 6.8 on Janumet 5-500 qd. Reports excellent med compliance without any adverse effects.   Continue Janumet 5-500 qd.

## 2020-12-24 NOTE — Assessment & Plan Note (Addendum)
Hospital follow up visit for right foot diabetic osteomyelitis, discharged on 12/19/20. 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 At d/c, cultures positive for staph epi and pseudomonas, and PICC line placed for IV daptomycin and cefepime; reports appropriate abx adherence. Was contacted by stewardship clinical pharmacist yesterday to start Flagyl 500 mg bid thru 01/27/21 as cultures also grew B. Fragilis. Pt to f/u with ID on 12/12 and orthopedics on 12/16. Vitals stable, pt afebrile. PICC line in place without drainage. Right foot with dressing and boot; 1+ edema of right leg, and not TTP. Did not remove dressing as Dr [name] instructed pt to keep R foot wrapped until following up at his office on 12/16. Pt reports no pain, and is occasionally using tylenol; she was prescribed oxy at d/c which she reports not having to use. She reports feeling well overall.  A1c at goal, 6.8; continue Janumet 5-500 qd.    Flagyl 500 mg bid prescribed  ID f/u on 12/12 Orthopedics f/u on 12/16 Continue pain management as above, and per Dr [name].

## 2020-12-24 NOTE — Assessment & Plan Note (Addendum)
BP 151/70 on Lisinopril 10 qd; was normotensive during hospital stay. Repeat BP 129/69. Reports good med compliance, tollerating med well without any adverse effects. Not monitoring BP at home, counseled on doing so and to provide log durig f/u appt. Lifestyle modifications discussed. Pt expresses understanding.   Continue Lisinopril from 10 mg qd  Monitor BP at home and f/u in 1 mo

## 2020-12-24 NOTE — Patient Instructions (Signed)
Thank you, [name] for allowing [place] to provide your care today!  Today we discussed  Foot infection Continue to follow up with your orthopedic and infectious disease appointments. Start taking Flagyl 500 mg twice daily until 01/27/21 or until infectious disease tells you to stop.   High blood pressure: Continue taking lisinopril every day  Please start monitoring your BP at home and write down the numbers so you can provide [place] with that information during your follow up visit.   Diabetes: Your A1c is good! Continue taking Janument and avoiding sugary foods.   Cholesterol: Continue taking atorvastatin    I will call you if any labs, tests, or imaging results require any further attention or action.   I have ordered the following labs for you:  Lab Orders         CBC no Diff         BMP8+Anion Gap       Referrals ordered today:   Referral Orders  No referral(s) requested today     Medications ordered or changed:  Stop the following medications: There are no discontinued medications.   Start the following medications: Meds ordered this encounter  Medications   metroNIDAZOLE (FLAGYL) 500 MG tablet    Sig: Take 1 tablet (500 mg total) by mouth 2 (two) times daily.    Dispense:  68 tablet    Refill:  0   Blood Pressure Monitoring (ADULT BLOOD PRESSURE CUFF LG) KIT    Sig: Use to monitor your BP daily and bring log during each visit    Dispense:  1 kit    Refill:  0      Follow up in: 1 month    Should you have any questions or concerns please call the internal medicine clinic at [phone].     Lajean Manes, MD  Internal Medicine Resident, PGY-1 [facility]

## 2020-12-24 NOTE — Assessment & Plan Note (Signed)
LDL at goal 49, 9 mo ago. Continue atorvastatin 40 mg qd.

## 2020-12-24 NOTE — Progress Notes (Signed)
CC: hospital f/u for diabetic osteomyelitis, discharged 12/3.   HPI:  [name] is a 65 y.o. female with a PMHx stated below and presents today for stated above. Please see the Encounters tab for problem-based Assessment & Plan for additional details.   Past Medical History:  Diagnosis Date   Arthritis    Chronic normocytic anemia    CKD (chronic kidney disease) stage 3, GFR 30-59 ml/min ([place]) 02/02/2013   Diabetes mellitus type II, controlled ([place]) 06/17/2009   Diabetic ulcer of both feet ([place]) 01/07/2010   Qualifier: Diagnosis of  By: Ina Homes MD, Amanjot     Essential hypertension 06/17/2009   GERD (gastroesophageal reflux disease)    Glaucoma    H/O hiatal hernia    Hyperlipidemia    MRSA (methicillin resistant Staphylococcus aureus)    Obstructive sleep apnea 05/26/2010    Current Outpatient Medications on File Prior to Visit  Medication Sig Dispense Refill   Accu-Chek Softclix Lancets lancets Use to check blood glucose up to three times a day. Dx code E 11.9 100 each 12   acetaminophen (TYLENOL) 500 MG tablet Take 500-1,000 mg by mouth every 6 (six) hours as needed for moderate pain or headache.     Ascorbic Acid (VITAMIN C) 1000 MG tablet Take 1,000 mg by mouth daily. [place] hips     aspirin EC 81 MG tablet Take 81 mg by mouth daily. Swallow whole.     atorvastatin (LIPITOR) 40 MG tablet TAKE ONE TABLET BY MOUTH DAILY (Patient taking differently: Take 40 mg by mouth daily.) 90 tablet 3   Biotin 5000 MCG CAPS Take 5,000 mcg by mouth daily.     Blood Glucose Monitoring Suppl (ACCU-CHEK AVIVA PLUS) w/Device KIT Use to monitor blood sugars 1 kit 0   brimonidine-timolol (COMBIGAN) 0.2-0.5 % ophthalmic solution Place 1 drop into both eyes every 12 (twelve) hours. Place one drop to both eyes twice daily for glaucoma (Patient taking differently: Place 1 drop into both eyes 2 (two) times daily. 8 am and 1700) 15 mL 0   calcium carbonate (TUMS - DOSED IN MG ELEMENTAL CALCIUM)  500 MG chewable tablet Chew 500-1,000 mg by mouth daily as needed for indigestion or heartburn.     Calcium Carbonate-Vitamin D (CALCIUM 600+D PO) Take 600 mg by mouth daily.     ceFEPime (MAXIPIME) IVPB Inject 2 g into the vein every 12 (twelve) hours. Indication:  R-foot osteo/PJI First Dose: Yes Last Day of Therapy:  01/28/21 Labs - Once weekly:  CBC/D and BMP, Labs - Every other week:  ESR and CRP Method of administration: IV Push Method of administration may be changed at the discretion of home infusion pharmacist based upon assessment of the patient and/or caregiver's ability to self-administer the medication ordered. 123 Units 0   Cholecalciferol (VITAMIN D) 50 MCG (2000 UT) tablet Take 2,000 Units by mouth daily.     COLLAGEN PO Take 100 mg by mouth daily.     cycloSPORINE (RESTASIS) 0.05 % ophthalmic emulsion Place 1 drop into both eyes 2 (two) times daily. 8 am and 1700     daptomycin (CUBICIN) IVPB Inject 700 mg into the vein daily. Indication:  R-foot PJI/osteo First Dose: Yes Last Day of Therapy:  01/28/21 Labs - Once weekly:  CBC/D, BMP, and CPK Labs - Every other week:  ESR and CRP Method of administration: IV Push Method of administration may be changed at the discretion of home infusion pharmacist based upon assessment of the patient  and/or caregiver's ability to self-administer the medication ordered. 41 Units 0   docusate sodium (COLACE) 100 MG capsule Take 1 capsule (100 mg total) by mouth 2 (two) times daily. While taking narcotic pain medicine. (Patient not taking: Reported on 12/14/2020) 30 capsule 0   docusate sodium (COLACE) 100 MG capsule Take 1 capsule (100 mg total) by mouth 2 (two) times daily. While taking narcotic pain medicine. 30 capsule 0   Emollient (BAG BALM EX) Apply 1 application topically daily.     ferrous sulfate 325 (65 FE) MG tablet Take 1 tablet (325 mg total) by mouth daily with breakfast. 90 tablet 1   glucose blood (ACCU-CHEK AVIVA) test strip Use  to check blood glucose up to three times a day. Dx code E 11.9 100 each 12   [name] TAKE ONE TABLET BY MOUTH TWICE A DAY 180 tablet 0   lisinopril (ZESTRIL) 10 MG tablet Take 1 tablet (10 mg total) by mouth daily. 90 tablet 1   methocarbamol (ROBAXIN) 500 MG tablet Take 500 mg by mouth every 6 (six) hours as needed for pain.     Multiple Vitamin (MULTIVITAMIN) tablet Take 1 tablet by mouth daily. 50 plus     Netarsudil-Latanoprost (ROCKLATAN) 0.02-0.005 % SOLN Place 1 drop into both eyes daily at 10 pm.     pantoprazole (PROTONIX) 20 MG tablet TAKE ONE TABLET BY MOUTH TWICE A DAY (Patient taking differently: Take 20 mg by mouth 2 (two) times daily.) 180 tablet 2   Polyvinyl Alcohol-Povidone (REFRESH OP) Place 1 drop into both eyes daily as needed (dry eyes).     senna (SENOKOT) 8.6 MG TABS tablet Take 2 tablets (17.2 mg total) by mouth 2 (two) times daily. (Patient not taking: Reported on 12/14/2020) 30 tablet 0   senna (SENOKOT) 8.6 MG TABS tablet Take 2 tablets (17.2 mg total) by mouth 2 (two) times daily. 30 tablet 0   Vitamin A 2400 MCG (8000 UT) TABS Take 8,000 Units by mouth daily.     vitamin B-12 (CYANOCOBALAMIN) 500 MCG tablet Take 500 mcg by mouth daily.     vitamin E 180 MG (400 UNITS) capsule Take 400 Units by mouth daily.     Zinc 50 MG CAPS Take 50 mg by mouth daily.      [DISCONTINUED] pravastatin (PRAVACHOL) 20 MG tablet Take 1 tablet (20 mg total) by mouth daily. 30 tablet 6   No current facility-administered medications on file prior to visit.    Family History  Problem Relation Age of Onset   Diabetes Mother    Hypertension Mother    Dementia Mother    Heart disease Father    Stroke Father    Diabetes Father    Hypertension Father    Gout Father    Deep vein thrombosis Father    Breast cancer Cousin    Breast cancer Cousin     Social History   Socioeconomic History   Marital status: Single    Spouse name: Not on file   Number of children:  Not on file   Years of education: Not on file   Highest education level: Not on file  Occupational History   Occupation: Disabled  Tobacco Use   Smoking status: Former    Packs/day: 0.50    Years: 0.00    Pack years: 0.00    Types: Cigarettes    Quit date: 12/22/1972    Years since quitting: 48.0   Smokeless tobacco: Never  Vaping Use   Vaping Use: Never used  Substance and Sexual Activity   Alcohol use: No   Drug use: No   Sexual activity: Not Currently  Other Topics Concern   Not on file  Social History Narrative   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   Smoked when she was a teenager   no alcohol   no Drugs      Current Social History 10/26/2018        Patient lives on the main level of a 3 floor boarding house with 3 other tenants. There are 3 steps with handrails up to the entrance the patient uses.       Patient's method of transportation is personal car.      The highest level of education was Bachelor's Degree.      The patient currently disabled.      Identified important Relationships are "My cousins, Jonette Eva and Ilean Skill, and my dear friend Dimas Chyle."      Pets : None       Interests / Fun: "I love to read, write, photography: go around [place] taking photos of the [place], Pop[employer]Art, Art Museums, cooking, hanging out with friends, several ministries with my [place], especially working with the youth."      Current Stressors: "Worrying about someone to care for me if anything happens to me. 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[place], BSN, RN-BC          Social Determinants of Health   Financial Resource Strain: Not on file  Food Insecurity: Not on file  Transportation Needs: Not on file  Physical Activity: Not on file  Stress: Not on file  Social Connections: Not on file  Intimate Partner Violence: Not on file     Review of Systems: ROS negative except for what is noted on the assessment and plan.  Vitals:   12/24/20 1321 12/24/20 1400  BP: (!) 151/70 129/63  Pulse:  77  Temp: 98.6 F (37 C)   TempSrc: Oral   SpO2: 100%   Weight: 246 lb 3.2 oz (111.7 kg)      Physical Exam: Constitutional: alert, well-appearing, in NAD Cardiovascular: RRR, no m/r/g; non-edematous left LE, 1+ pitting edema of right LE.  Pulmonary/Chest: normal work of breathing on RA, LCTAB Abdominal: soft, non-tender to palpation, non-distended MSK: normal bulk and tone  Neurological: A&O x 3, 5/5 strength in bilateral upper and lower extremities  Skin: warm and dry.  Extremities: PICC line in place on right arm; non-tender site and no signs of drainage. Right foot with 1+ edema, non-TTP, no obvious drainage of foot.  Psych: normal behavior, normal affect    Assessment & Plan:   See Encounters Tab for problem based charting.  Patient seen with Dr. Lavonda Jumbo, MD  Internal Medicine Resident, PGY-1 [facility]

## 2020-12-24 NOTE — Assessment & Plan Note (Addendum)
Flu exam given  Eye exam was done this year  Order for DEXA scan placed   F/u on BMP and CBC to assess kidney fxn and Hbg given elevated Cr and drop in Hgb at d/c.   Addendum: Stable BMP and CBC

## 2020-12-25 LAB — CBC
Hematocrit: 30.9 % — ABNORMAL LOW (ref 34.0–46.6)
Hemoglobin: 10 g/dL — ABNORMAL LOW (ref 11.1–15.9)
MCH: 27.2 pg (ref 26.6–33.0)
MCHC: 32.4 g/dL (ref 31.5–35.7)
MCV: 84 fL (ref 79–97)
Platelets: 209 10*3/uL (ref 150–450)
RBC: 3.68 x10E6/uL — ABNORMAL LOW (ref 3.77–5.28)
RDW: 15.1 % (ref 11.7–15.4)
WBC: 7.7 10*3/uL (ref 3.4–10.8)

## 2020-12-25 LAB — BMP8+ANION GAP
Anion Gap: 13 mmol/L (ref 10.0–18.0)
BUN/Creatinine Ratio: 20 (ref 12–28)
BUN: 25 mg/dL (ref 8–27)
CO2: 23 mmol/L (ref 20–29)
Calcium: 9.8 mg/dL (ref 8.7–10.3)
Chloride: 101 mmol/L (ref 96–106)
Creatinine, Ser: 1.26 mg/dL — ABNORMAL HIGH (ref 0.57–1.00)
Glucose: 91 mg/dL (ref 70–99)
Potassium: 4.4 mmol/L (ref 3.5–5.2)
Sodium: 137 mmol/L (ref 134–144)
eGFR: 47 mL/min/{1.73_m2} — ABNORMAL LOW (ref >59)

## 2020-12-28 ENCOUNTER — Encounter: Payer: Self-pay | Admitting: Infectious Disease

## 2020-12-28 ENCOUNTER — Telehealth: Payer: Self-pay

## 2020-12-28 ENCOUNTER — Ambulatory Visit (INDEPENDENT_AMBULATORY_CARE_PROVIDER_SITE_OTHER): Payer: Medicare Other | Admitting: Infectious Disease

## 2020-12-28 ENCOUNTER — Other Ambulatory Visit: Payer: Self-pay

## 2020-12-28 VITALS — BP 135/80 | HR 77 | Temp 97.4°F | Wt 252.0 lb

## 2020-12-28 DIAGNOSIS — E1169 Type 2 diabetes mellitus with other specified complication: Secondary | ICD-10-CM

## 2020-12-28 DIAGNOSIS — N1832 Chronic kidney disease, stage 3b: Secondary | ICD-10-CM | POA: Diagnosis not present

## 2020-12-28 DIAGNOSIS — A499 Bacterial infection, unspecified: Secondary | ICD-10-CM | POA: Insufficient documentation

## 2020-12-28 DIAGNOSIS — M86372 Chronic multifocal osteomyelitis, left ankle and foot: Secondary | ICD-10-CM

## 2020-12-28 DIAGNOSIS — T847XXD Infection and inflammatory reaction due to other internal orthopedic prosthetic devices, implants and grafts, subsequent encounter: Secondary | ICD-10-CM

## 2020-12-28 DIAGNOSIS — E1122 Type 2 diabetes mellitus with diabetic chronic kidney disease: Secondary | ICD-10-CM | POA: Diagnosis not present

## 2020-12-28 DIAGNOSIS — M869 Osteomyelitis, unspecified: Secondary | ICD-10-CM

## 2020-12-28 DIAGNOSIS — T847XXA Infection and inflammatory reaction due to other internal orthopedic prosthetic devices, implants and grafts, initial encounter: Secondary | ICD-10-CM | POA: Insufficient documentation

## 2020-12-28 HISTORY — DX: Bacterial infection, unspecified: A49.9

## 2020-12-28 HISTORY — DX: Infection and inflammatory reaction due to other internal orthopedic prosthetic devices, implants and grafts, initial encounter: T84.7XXA

## 2020-12-28 MED ORDER — METRONIDAZOLE 500 MG PO TABS: 500.0000 mg | ORAL_TABLET | Freq: Three times a day (TID) | ORAL | 0 refills | Status: DC

## 2020-12-28 NOTE — Telephone Encounter (Signed)
I sent a message to [facility] and her team with [facility] to give orders. Per Dr. Daiva Eves end date for IV antibiotics is 01/27/21 and picc can be removed after last dose.  Brindy Higginbotham T Pricilla Loveless

## 2020-12-28 NOTE — Progress Notes (Signed)
Subjective:  Chief complaint: Still with some foot pain following up for diabetic foot infection with hardware associated   Patient ID: [name], female    DOB: [birth year], 65 y.o.   MRN: [id number]  HPI  [name] is a 65 y.o. female with history of hypertension diabetes mellitus Charcot arthropathy who is followed by my partner Dr. Orvan Falconer in the past when she had had had evidence of calcaneal osteomyelitis.  She had a bone biopsy in February 2021 which grew Citrobacter Streptococcus mitis paralysis and Streptococcus anginosus.  She was treated with ceftriaxone and oral metronidazole and completed 8 weeks of therapy before switching to Keflex and metronidazole and completing 12 weeks in its entirety.   After that she later developed drainage and an ulcer on her right foot for which she was followed by [name] and wound care.  Cultures from October 2021 grew and imipenem resistant Pseudomonas aeruginosa and an MRSA that was resistant to Bactrim.  Since then [name] subsequent underwent midfoot arthrodesis for Charcot arthropathy and this right foot.  She developed painful hardware at the site of arthrodesis.  She had been seen by [name] in the office and had placed her on doxycycline.  Ultimately decision was made to take to the operating room to remove hardware.  In the operating room [name] found fluctuance dorsal to the ulcer and excised to this as well as the screw head that was identified and removed along with washer with debridement from the skin down through the subcutaneous tissue and into the bone of the screw tract.  Material at the dorsal wound was also excised and sent for culture.   At the time I last saw her cultures were still incubating.  I decided to go with an empiric regimen of daptomycin and and cefepime which she has been receiving.  Note the cefepime was adjusted to twice daily due to her renal function having worsened.    Since then cultures  have turned positive for Staph epidermidis Pseudomonas aeruginosa at this time not resistant to carbapenems and beta lactamase positive Bacteroides fragilis.  When the last organism was isolated twice daily metronidazole was added by the primary team.  She has follow-up with internal medicine since then.  She is going to see [name] later this week.  Her faint pain in the foot has improved dramatically.  She continues on antibiotics currently.     Past Medical History:  Diagnosis Date   Arthritis    Chronic normocytic anemia    CKD (chronic kidney disease) stage 3, GFR 30-59 ml/min (HCC) 02/02/2013   Diabetes mellitus type II, controlled (HCC) 06/17/2009   Diabetic ulcer of both feet (HCC) 01/07/2010   Qualifier: Diagnosis of  By: [name] MD, Amanjot     Essential hypertension 06/17/2009   GERD (gastroesophageal reflux disease)    Glaucoma    H/O hiatal hernia    Hyperlipidemia    MRSA (methicillin resistant Staphylococcus aureus)    Obstructive sleep apnea 05/26/2010    Past Surgical History:  Procedure Laterality Date   ACHILLES TENDON LENGTHENING Left 11/06/2014   Procedure: LEFT ACHILLES TENDON LENGTHENING, ;  Surgeon: Toni Arthurs, MD;  Location: [facility];  Service: Orthopedics;  Laterality: Left;   APPENDECTOMY  1975   FOOT ARTHRODESIS Left 11/06/2014   Procedure: TALONAVICULAR CANCELLOUS CUBOID ARTHRODESIS WITH CORRECTIVE OSTEOTOMY ;  Surgeon: Toni Arthurs, MD;  Location: [facility];  Service: Orthopedics;  Laterality: Left;   FOOT ARTHRODESIS Right 05/14/2020  Procedure: Right heelcord lengthening, subtalar and talonavicular arthrodesis, talus exostectomy, talus corrective osteotomy;  Surgeon: [name] Simmer, MD;  Location: [place];  Service: Orthopedics;  Laterality: Right;   HARDWARE REMOVAL Left 02/19/2015   Procedure: REMOVAL OF LEFT FOOT EXTERNAL FIXATOR, CASTING OF LEFT ANKLE ;  Surgeon: [name] Simmer, MD;  Location: [place];  Service: Orthopedics;  Laterality:  Left;   HARDWARE REMOVAL Right 12/17/2020   Procedure: Removal of deep implant right foot;  Surgeon: [name] Simmer, MD;  Location: [place];  Service: Orthopedics;  Laterality: Right;   I & D EXTREMITY Left 2011   "ulcer on my foot got infected" (01/30/2013)   MULTIPLE TOOTH EXTRACTIONS Bilateral    OOPHORECTOMY Bilateral 1975-1976    Family History  Problem Relation Age of Onset   Diabetes Mother    Hypertension Mother    Dementia Mother    Heart disease Father    Stroke Father    Diabetes Father    Hypertension Father    Gout Father    Deep vein thrombosis Father    Breast cancer Cousin    Breast cancer Cousin       Social History   Socioeconomic History   Marital status: Single    Spouse name: Not on file   Number of children: Not on file   Years of education: Not on file   Highest education level: Not on file  Occupational History   Occupation: Disabled  Tobacco Use   Smoking status: Former    Packs/day: 0.50    Years: 0.00    Pack years: 0.00    Types: Cigarettes    Quit date: 12/22/1972    Years since quitting: 48.0   Smokeless tobacco: Never  Vaping Use   Vaping Use: Never used  Substance and Sexual Activity   Alcohol use: No   Drug use: No   Sexual activity: Not Currently  Other Topics Concern   Not on file  Social History Narrative   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   Smoked when she was a teenager   no alcohol   no Drugs      Current Social History 10/26/2018        Patient lives on the main level of a 3 floor boarding house with 3 other tenants. There are 3 steps with handrails up to the entrance the patient uses.       Patient's method of transportation is personal car.      The highest level of education was Bachelor's Degree.      The patient currently disabled.      Identified important Relationships are "My cousins, Jonette Eva and Ilean Skill, and my dear friend Dimas Chyle."      Pets :  None       Interests / Fun: "I love to read, write, photography: go around [place] taking photos of the [place], Pop-Art, Art Museums, cooking, hanging out with friends, several ministries with my [place], especially working with the youth."      Current Stressors: "Worrying about someone to care for me if anything happens to me. 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Ducatte, BSN, RN-BC          Social Determinants of Health   Financial Resource Strain: Not on file  Food Insecurity: Not on  file  Transportation Needs: Not on file  Physical Activity: Not on file  Stress: Not on file  Social Connections: Not on file    Allergies  Allergen Reactions   Codeine Itching and Swelling    swelling in throat   Pravastatin Other (See Comments)    cramps   Latex Rash     Current Outpatient Medications:    Accu-Chek Softclix Lancets lancets, Use to check blood glucose up to three times a day. Dx code E 11.9, Disp: 100 each, Rfl: 12   acetaminophen (TYLENOL) 500 MG tablet, Take 500-1,000 mg by mouth every 6 (six) hours as needed for moderate pain or headache., Disp: , Rfl:    Ascorbic Acid (VITAMIN C) 1000 MG tablet, Take 1,000 mg by mouth daily. Rose hips, Disp: , Rfl:    aspirin EC 81 MG tablet, Take 81 mg by mouth daily. Swallow whole., Disp: , Rfl:    atorvastatin (LIPITOR) 40 MG tablet, TAKE ONE TABLET BY MOUTH DAILY (Patient taking differently: Take 40 mg by mouth daily.), Disp: 90 tablet, Rfl: 3   Biotin 5000 MCG CAPS, Take 5,000 mcg by mouth daily., Disp: , Rfl:    Blood Glucose Monitoring Suppl (ACCU-CHEK AVIVA PLUS) w/Device KIT, Use to monitor blood sugars, Disp: 1 kit, Rfl: 0   Blood Pressure Monitoring (ADULT BLOOD PRESSURE CUFF LG) KIT, Use to monitor your BP daily and bring log during each visit, Disp: 1 kit, Rfl: 0   brimonidine-timolol (COMBIGAN) 0.2-0.5 % ophthalmic solution, Place 1 drop  into both eyes every 12 (twelve) hours. Place one drop to both eyes twice daily for glaucoma (Patient taking differently: Place 1 drop into both eyes 2 (two) times daily. 8 am and 1700), Disp: 15 mL, Rfl: 0   calcium carbonate (TUMS - DOSED IN MG ELEMENTAL CALCIUM) 500 MG chewable tablet, Chew 500-1,000 mg by mouth daily as needed for indigestion or heartburn., Disp: , Rfl:    Calcium Carbonate-Vitamin D (CALCIUM 600+D PO), Take 600 mg by mouth daily., Disp: , Rfl:    ceFEPime (MAXIPIME) IVPB, Inject 2 g into the vein every 12 (twelve) hours. Indication:  R-foot osteo/PJI First Dose: Yes Last Day of Therapy:  01/28/21 Labs - Once weekly:  CBC/D and BMP, Labs - Every other week:  ESR and CRP Method of administration: IV Push Method of administration may be changed at the discretion of home infusion pharmacist based upon assessment of the patient and/or caregiver's ability to self-administer the medication ordered., Disp: 123 Units, Rfl: 0   Cholecalciferol (VITAMIN D) 50 MCG (2000 UT) tablet, Take 2,000 Units by mouth daily., Disp: , Rfl:    COLLAGEN PO, Take 100 mg by mouth daily., Disp: , Rfl:    cycloSPORINE (RESTASIS) 0.05 % ophthalmic emulsion, Place 1 drop into both eyes 2 (two) times daily. 8 am and 1700, Disp: , Rfl:    daptomycin (CUBICIN) IVPB, Inject 700 mg into the vein daily. Indication:  R-foot PJI/osteo First Dose: Yes Last Day of Therapy:  01/28/21 Labs - Once weekly:  CBC/D, BMP, and CPK Labs - Every other week:  ESR and CRP Method of administration: IV Push Method of administration may be changed at the discretion of home infusion pharmacist based upon assessment of the patient and/or caregiver's ability to self-administer the medication ordered., Disp: 41 Units, Rfl: 0   docusate sodium (COLACE) 100 MG capsule, Take 1 capsule (100 mg total) by mouth 2 (two) times daily. While taking narcotic pain medicine., Disp: 30 capsule,  Rfl: 0   docusate sodium (COLACE) 100 MG capsule, Take 1 capsule  (100 mg total) by mouth 2 (two) times daily. While taking narcotic pain medicine., Disp: 30 capsule, Rfl: 0   Emollient (BAG BALM EX), Apply 1 application topically daily., Disp: , Rfl:    ferrous sulfate 325 (65 FE) MG tablet, Take 1 tablet (325 mg total) by mouth daily with breakfast., Disp: 90 tablet, Rfl: 1   glucose blood (ACCU-CHEK AVIVA) test strip, Use to check blood glucose up to three times a day. Dx code E 11.9, Disp: 100 each, Rfl: 12   [name], TAKE ONE TABLET BY MOUTH TWICE A DAY, Disp: 180 tablet, Rfl: 0   lisinopril (ZESTRIL) 10 MG tablet, Take 1 tablet (10 mg total) by mouth daily., Disp: 90 tablet, Rfl: 1   methocarbamol (ROBAXIN) 500 MG tablet, Take 500 mg by mouth every 6 (six) hours as needed for pain., Disp: , Rfl:    metroNIDAZOLE (FLAGYL) 500 MG tablet, Take 1 tablet (500 mg total) by mouth 2 (two) times daily., Disp: 68 tablet, Rfl: 0   Multiple Vitamin (MULTIVITAMIN) tablet, Take 1 tablet by mouth daily. 50 plus, Disp: , Rfl:    Netarsudil-Latanoprost (ROCKLATAN) 0.02-0.005 % SOLN, Place 1 drop into both eyes daily at 10 pm., Disp: , Rfl:    pantoprazole (PROTONIX) 20 MG tablet, TAKE ONE TABLET BY MOUTH TWICE A DAY (Patient taking differently: Take 20 mg by mouth 2 (two) times daily.), Disp: 180 tablet, Rfl: 2   Polyvinyl Alcohol-Povidone (REFRESH OP), Place 1 drop into both eyes daily as needed (dry eyes)., Disp: , Rfl:    senna (SENOKOT) 8.6 MG TABS tablet, Take 2 tablets (17.2 mg total) by mouth 2 (two) times daily., Disp: 30 tablet, Rfl: 0   senna (SENOKOT) 8.6 MG TABS tablet, Take 2 tablets (17.2 mg total) by mouth 2 (two) times daily., Disp: 30 tablet, Rfl: 0   Vitamin A 2400 MCG (8000 UT) TABS, Take 8,000 Units by mouth daily., Disp: , Rfl:    vitamin B-12 (CYANOCOBALAMIN) 500 MCG tablet, Take 500 mcg by mouth daily., Disp: , Rfl:    vitamin E 180 MG (400 UNITS) capsule, Take 400 Units by mouth daily., Disp: , Rfl:    Zinc 50 MG CAPS, Take 50 mg by  mouth daily. , Disp: , Rfl:     Review of Systems  Constitutional:  Negative for activity change, appetite change, chills, diaphoresis, fatigue, fever and unexpected weight change.  HENT:  Negative for congestion, rhinorrhea, sinus pressure, sneezing, sore throat and trouble swallowing.   Eyes:  Negative for photophobia and visual disturbance.  Respiratory:  Negative for cough, chest tightness, shortness of breath, wheezing and stridor.   Cardiovascular:  Negative for chest pain, palpitations and leg swelling.  Gastrointestinal:  Negative for abdominal distention, abdominal pain, anal bleeding, blood in stool, constipation, diarrhea, nausea and vomiting.  Genitourinary:  Negative for difficulty urinating, dysuria, flank pain and hematuria.  Musculoskeletal:  Negative for arthralgias, back pain, gait problem, joint swelling and myalgias.  Skin:  Positive for wound. Negative for color change, pallor and rash.  Neurological:  Negative for dizziness, tremors, weakness and light-headedness.  Hematological:  Negative for adenopathy. Does not bruise/bleed easily.  Psychiatric/Behavioral:  Negative for agitation, behavioral problems, confusion, decreased concentration, dysphoric mood and sleep disturbance.       Objective:   Physical Exam Constitutional:      General: She is not in acute distress.  Appearance: Normal appearance. She is well-developed. She is not ill-appearing or diaphoretic.  HENT:     Head: Normocephalic and atraumatic.     Right Ear: Hearing and external ear normal.     Left Ear: Hearing and external ear normal.     Nose: No nasal deformity or rhinorrhea.  Eyes:     General: No scleral icterus.    Conjunctiva/sclera: Conjunctivae normal.     Right eye: Right conjunctiva is not injected.     Left eye: Left conjunctiva is not injected.     Pupils: Pupils are equal, round, and reactive to light.  Neck:     Vascular: No JVD.  Cardiovascular:     Rate and Rhythm: Normal  rate and regular rhythm.     Heart sounds: Normal heart sounds, S1 normal and S2 normal. No murmur heard.   No friction rub.  Abdominal:     General: Bowel sounds are normal. There is no distension.     Palpations: Abdomen is soft.     Tenderness: There is no abdominal tenderness.  Musculoskeletal:        General: Normal range of motion.     Right shoulder: Normal.     Left shoulder: Normal.     Cervical back: Normal range of motion and neck supple.     Right hip: Normal.     Left hip: Normal.     Right knee: Normal.     Left knee: Normal.  Lymphadenopathy:     Head:     Right side of head: No submandibular, preauricular or posterior auricular adenopathy.     Left side of head: No submandibular, preauricular or posterior auricular adenopathy.     Cervical: No cervical adenopathy.     Right cervical: No superficial or deep cervical adenopathy.    Left cervical: No superficial or deep cervical adenopathy.  Skin:    General: Skin is warm and dry.     Coloration: Skin is not pale.     Findings: No abrasion, bruising, ecchymosis, erythema, lesion or rash.     Nails: There is no clubbing.  Neurological:     Mental Status: She is alert and oriented to person, place, and time.     Sensory: No sensory deficit.     Coordination: Coordination normal.     Gait: Gait normal.  Psychiatric:        Attention and Perception: She is attentive.        Mood and Affect: Mood normal.        Speech: Speech normal.        Behavior: Behavior normal. Behavior is cooperative.        Thought Content: Thought content normal.        Judgment: Judgment normal.    Foot is in bandage that was not to be removed until she sees [name]      Assessment & Plan:   Polymicrobial hardware associated osteomyelitis:  Will have her complete 6 weeks of vancomycin cefepime and metronidazole.  I am increasing the metronidazole to 503 times daily given that were dealing with osteomyelitis.  Once we complete  antibiotics I will plan on seeing her roughly a month after she finishes them to make sure she has not had evidence of recurrence.  Certainly if she has evidence of her infection progressing despite antibiotics and she is needing more surgeries we need to be involved.  She is also to get in touch with [place] if there is  any pain that is worsening in her foot now or when she has antibiotic stopped particular.  CKD: We will monitor and continue to monitor her renal function on antibiotics and her antibiotic doses have been adjusted accordingly.    I spent 42 minutes with the patient including than 50% of the time in face to face counseling of the patient guarding osteomyelitis hardware associated infections polymicrobial infections in particular in diabetics along with personally reviewing MRI from the foot from inpatient admission her inflammatory markers her cultures in the operating room along with review of medical records in preparation for the visit and during the visit and in coordination of her care.

## 2020-12-28 NOTE — Progress Notes (Signed)
Internal Medicine Clinic Attending  I saw and evaluated the patient.  I personally confirmed the key portions of the history and exam documented by Dr. Patel and I reviewed pertinent patient test results.  The assessment, diagnosis, and plan were formulated together and I agree with the documentation in the resident's note.  

## 2020-12-28 NOTE — Telephone Encounter (Signed)
error 

## 2021-01-05 ENCOUNTER — Encounter: Payer: Self-pay | Admitting: Infectious Disease

## 2021-01-06 ENCOUNTER — Other Ambulatory Visit: Payer: Self-pay

## 2021-01-06 DIAGNOSIS — E1169 Type 2 diabetes mellitus with other specified complication: Secondary | ICD-10-CM

## 2021-01-06 MED ORDER — JANUMET XR 50-500 MG PO TB24: 1.0000 | ORAL_TABLET | Freq: Two times a day (BID) | ORAL | 0 refills | Status: DC

## 2021-01-10 ENCOUNTER — Other Ambulatory Visit: Payer: Self-pay | Admitting: Internal Medicine

## 2021-01-10 DIAGNOSIS — I10 Essential (primary) hypertension: Secondary | ICD-10-CM

## 2021-01-12 ENCOUNTER — Encounter: Payer: Self-pay | Admitting: Infectious Disease

## 2021-01-12 ENCOUNTER — Telehealth: Payer: Self-pay | Admitting: *Deleted

## 2021-01-12 MED ORDER — FLUCONAZOLE 150 MG PO TABS
150.0000 mg | ORAL_TABLET | Freq: Once | ORAL | 0 refills | Status: AC
Start: 2021-01-12 — End: 2021-01-12

## 2021-01-12 NOTE — Telephone Encounter (Signed)
Call from [place] from [facility].  Patient is c/o of a yeast infection.  Has didcharge, burning and itching.  Patient is on Sefepime 2 Grams 1 injection 20 ml q 12 hours,  Dapto

## 2021-01-12 NOTE — Telephone Encounter (Incomplete Revision)
Call from [place] from [facility].  Patient is c/o of a yeast infection.  Has didcharge, burning and itching.  Patient is on Sefepime 2 Grams 1 injection 20 ml q 12 hours,  Daptomycin,  Metronidazole 500 mg 1 tab TID.  Symptoms started last Tuesday.  Has tried [name] with no relief x 2 days.  Would like to get something else for if possible and send to [facility] on [address].  Lawanna Kobus can be reached at [phone] for further questions if needed.

## 2021-01-19 ENCOUNTER — Encounter: Payer: Self-pay | Admitting: Infectious Disease

## 2021-01-21 ENCOUNTER — Encounter: Payer: Self-pay | Admitting: Student

## 2021-01-21 ENCOUNTER — Ambulatory Visit (INDEPENDENT_AMBULATORY_CARE_PROVIDER_SITE_OTHER): Payer: Medicare Other | Admitting: Student

## 2021-01-21 DIAGNOSIS — E1169 Type 2 diabetes mellitus with other specified complication: Secondary | ICD-10-CM

## 2021-01-21 DIAGNOSIS — I10 Essential (primary) hypertension: Secondary | ICD-10-CM

## 2021-01-21 DIAGNOSIS — M869 Osteomyelitis, unspecified: Secondary | ICD-10-CM

## 2021-01-21 DIAGNOSIS — L97519 Non-pressure chronic ulcer of other part of right foot with unspecified severity: Secondary | ICD-10-CM

## 2021-01-21 DIAGNOSIS — E11621 Type 2 diabetes mellitus with foot ulcer: Secondary | ICD-10-CM

## 2021-01-21 MED ORDER — LISINOPRIL 20 MG PO TABS: 20.0000 mg | ORAL_TABLET | Freq: Every day | ORAL | 3 refills | Status: DC

## 2021-01-21 MED ORDER — METRONIDAZOLE 500 MG PO TABS: 500.0000 mg | ORAL_TABLET | Freq: Three times a day (TID) | ORAL | 0 refills | Status: AC

## 2021-01-21 NOTE — Patient Instructions (Addendum)
It was a pleasure seeing you in clinic. Today we discussed:   Right foot wound Continue with IV and oral antibiotics until 1/11. Follow up with the surgeon for stitch removal. Please follow up if you develop drainage, redness, pain, or other signs of infection  Blood pressure: Please increase your lisinopril to 20 mg daily. Bring your BP cuff and log of pressures to your next visit.  Follow up in 1 month   If you have any questions or concerns, please call our clinic at [phone] between 9am-5pm and after hours call [phone] and ask for the internal medicine resident on call. If you feel you are having a medical emergency please call 911.   Thank you, we look forward to helping you remain healthy!

## 2021-01-21 NOTE — Progress Notes (Signed)
° °  CC: diabetic foot ulcer follow up  HPI:  [name] is a 66 y.o. female with history below presents for follow up of polymicrobial infection of the right midfoot diabetic ulcer. Please refer to problem based charting for further details and assessment and plan of current problem and chronic medical conditions.   Past Medical History:  Diagnosis Date   Arthritis    Chronic normocytic anemia    CKD (chronic kidney disease) stage 3, GFR 30-59 ml/min ([facility]) 02/02/2013   Diabetes mellitus type II, controlled ([facility]) 06/17/2009   Diabetic ulcer of both feet ([facility]) 01/07/2010   Qualifier: Diagnosis of  By: Ina Homes MD, Amanjot     Essential hypertension 06/17/2009   GERD (gastroesophageal reflux disease)    Glaucoma    H/O hiatal hernia    Hardware complicating wound infection ([facility]) 12/28/2020   Hyperlipidemia    MRSA (methicillin resistant Staphylococcus aureus)    Obstructive sleep apnea 05/26/2010   Polymicrobial bacterial infection 12/28/2020   Review of Systems:  negative as per HPI  Physical Exam:  Vitals:   01/21/21 1319 01/21/21 1513  BP: (!) 141/58 (!) 141/63  Pulse: 72 70  Temp: 98.2 F (36.8 C)   TempSrc: Oral   SpO2: 100%   Weight: 246 lb 6.4 oz (111.8 kg)   Height: 5\' 9"  (1.753 m)    Physical Exam Constitutional:      Appearance: Normal appearance.  HENT:     Head: Normocephalic and atraumatic.  Eyes:     Extraocular Movements: Extraocular movements intact.     Pupils: Pupils are equal, round, and reactive to light.  Cardiovascular:     Rate and Rhythm: Normal rate and regular rhythm.     Pulses: Normal pulses.     Heart sounds: Normal heart sounds.  Pulmonary:     Effort: Pulmonary effort is normal. No respiratory distress.     Breath sounds: Normal breath sounds.  Abdominal:     General: Abdomen is flat. Bowel sounds are normal. There is no distension.     Palpations: Abdomen is soft.     Tenderness: There is no abdominal tenderness.  Skin:     General: Skin is warm and dry.     Capillary Refill: Capillary refill takes less than 2 seconds.     Comments: Healing incision  of the medial right midfoot with sutures in place, no redness, swelling, warmth or drainage. See photo below for more detail, PICC line in place, dressed and clean  Neurological:     General: No focal deficit present.     Mental Status: She is alert and oriented to person, place, and time.  Psychiatric:        Mood and Affect: Mood normal.        Behavior: Behavior normal.     D  Assessment & Plan:   See Encounters Tab for problem based charting.  Patient discussed with Dr. Jimmye Norman

## 2021-01-22 NOTE — Assessment & Plan Note (Signed)
Patient presents for follow up of polymicrobial wound infection s/p hardware removal and I&D in December 2022. Continues to received outpatient IV antibiotics with daptomycin and cefepime as well as oral metronidazole. Metronidazole increased to three times daily at ID visit 12/21. Will need antibiotics until 01/27/2021. Patient states she will need a refill on metronidazole given the dosage increase. On physical exam wound is healing well, no signs of infection. She is doing daily dressing changes with betadine. Had last session of PT last week. Now progressing from rolling walker to cane and feels capable to handling her ADLs. Has follow up with surgery on 1/11 for suture removal, and follow up with ID in February.   Plan Continue IV antibiotics until 1/11 Sent for 3 more days of metronidazole to complete until 1/11 Plan for PICC removal on 1/11 Follow up with surgery for suture removal Follow up with ID

## 2021-01-22 NOTE — Assessment & Plan Note (Signed)
Vitals:   01/21/21 1319 01/21/21 1513  BP: (!) 141/58 (!) 141/63  Pulse: 72 70  Temp: 98.2 F (36.8 C)   SpO2: 100%    BP elevated in office today. Reports BP in the 140s with RN visits as well. Not in significant pain from foot wound. Currently on lisinopril 10 mg daily.   Plan Increase Lisinopril to 20 mg daily Follow up BP in 1 month Patient will bring cuff and pressure log to next visit

## 2021-01-26 ENCOUNTER — Encounter: Payer: Self-pay | Admitting: Infectious Disease

## 2021-01-28 ENCOUNTER — Other Ambulatory Visit: Payer: Self-pay

## 2021-01-29 MED ORDER — ATORVASTATIN CALCIUM 40 MG PO TABS: 40.0000 mg | ORAL_TABLET | Freq: Every day | ORAL | 3 refills | Status: DC

## 2021-02-11 NOTE — Progress Notes (Signed)
Internal Medicine Clinic Attending ? ?Case discussed with Dr. Liang  At the time of the visit.  We reviewed the resident?s history and exam and pertinent patient test results.  I agree with the assessment, diagnosis, and plan of care documented in the resident?s note. ? ?

## 2021-02-12 ENCOUNTER — Other Ambulatory Visit: Payer: Self-pay

## 2021-02-12 DIAGNOSIS — K219 Gastro-esophageal reflux disease without esophagitis: Secondary | ICD-10-CM

## 2021-02-12 MED ORDER — PANTOPRAZOLE SODIUM 20 MG PO TBEC: 20.0000 mg | DELAYED_RELEASE_TABLET | Freq: Two times a day (BID) | ORAL | 2 refills | Status: DC

## 2021-02-12 NOTE — Telephone Encounter (Signed)
pantoprazole (PROTONIX) 20 MG tablet, refill request @ [facility] [id number] [name], [place] - [address]

## 2021-02-18 ENCOUNTER — Ambulatory Visit (INDEPENDENT_AMBULATORY_CARE_PROVIDER_SITE_OTHER): Payer: Medicare Other | Admitting: Internal Medicine

## 2021-02-18 VITALS — BP 115/50 | HR 65 | Temp 98.1°F | Wt 246.8 lb

## 2021-02-18 DIAGNOSIS — I10 Essential (primary) hypertension: Secondary | ICD-10-CM | POA: Diagnosis not present

## 2021-02-18 NOTE — Patient Instructions (Signed)
Thank you, [name] for allowing [place] to provide your care today. Today we discussed blood pressure.    Labs/Tests Ordered:  Lab Orders         BMP8+Anion Gap      Referrals Ordered:  Referral Orders  No referral(s) requested today     Medication Changes:  There are no discontinued medications.   No orders of the defined types were placed in this encounter.    Health Maintenance Screening: Diabetes Health Maintenance Due  Topic Date Due   PAP Smear  Due this year   HEMOGLOBIN A1C  03/17/2021   OPHTHALMOLOGY EXAM  Due this year   FOOT EXAM  Due this year     Instructions: Keep up the good work!  Follow up:  1 month for diabetes.    Remember: If you have any questions or concerns, call our clinic at [phone] or after hours call [phone] and ask for the internal medicine resident on call.  Dellia Cloud, D.O. [facility]

## 2021-02-18 NOTE — Progress Notes (Signed)
Subjective:  CC: HTN  HPI:  [name] is a 66 y.o. female with a past medical history stated below and presents today for HTN. Please see problem based assessment and plan for additional details.  Past Medical History:  Diagnosis Date   Arthritis    Chronic normocytic anemia    CKD (chronic kidney disease) stage 3, GFR 30-59 ml/min ([place]) 02/02/2013   Diabetes mellitus type II, controlled ([place]) 06/17/2009   Diabetic ulcer of both feet ([place]) 01/07/2010   Qualifier: Diagnosis of  By: Ina Homes MD, Amanjot     Essential hypertension 06/17/2009   GERD (gastroesophageal reflux disease)    Glaucoma    H/O hiatal hernia    Hardware complicating wound infection ([place]) 12/28/2020   Hyperlipidemia    MRSA (methicillin resistant Staphylococcus aureus)    Obstructive sleep apnea 05/26/2010   Polymicrobial bacterial infection 12/28/2020    Current Outpatient Medications on File Prior to Visit  Medication Sig Dispense Refill   Accu-Chek Softclix Lancets lancets Use to check blood glucose up to three times a day. Dx code E 11.9 100 each 12   acetaminophen (TYLENOL) 500 MG tablet Take 500-1,000 mg by mouth every 6 (six) hours as needed for moderate pain or headache.     Ascorbic Acid (VITAMIN C) 1000 MG tablet Take 1,000 mg by mouth daily. Rose hips     aspirin EC 81 MG tablet Take 81 mg by mouth daily. Swallow whole.     atorvastatin (LIPITOR) 40 MG tablet Take 1 tablet (40 mg total) by mouth daily. 90 tablet 3   Biotin 5000 MCG CAPS Take 5,000 mcg by mouth daily.     Blood Glucose Monitoring Suppl (ACCU-CHEK AVIVA PLUS) w/Device KIT Use to monitor blood sugars 1 kit 0   Blood Pressure Monitoring (ADULT BLOOD PRESSURE CUFF LG) KIT Use to monitor your BP daily and bring log during each visit 1 kit 0   brimonidine-timolol (COMBIGAN) 0.2-0.5 % ophthalmic solution Place 1 drop into both eyes every 12 (twelve) hours. Place one drop to both eyes twice daily for glaucoma (Patient taking  differently: Place 1 drop into both eyes 2 (two) times daily. 8 am and 1700) 15 mL 0   calcium carbonate (TUMS - DOSED IN MG ELEMENTAL CALCIUM) 500 MG chewable tablet Chew 500-1,000 mg by mouth daily as needed for indigestion or heartburn.     Calcium Carbonate-Vitamin D (CALCIUM 600+D PO) Take 600 mg by mouth daily.     Cholecalciferol (VITAMIN D) 50 MCG (2000 UT) tablet Take 2,000 Units by mouth daily.     COLLAGEN PO Take 100 mg by mouth daily.     cycloSPORINE (RESTASIS) 0.05 % ophthalmic emulsion Place 1 drop into both eyes 2 (two) times daily. 8 am and 1700     docusate sodium (COLACE) 100 MG capsule Take 1 capsule (100 mg total) by mouth 2 (two) times daily. While taking narcotic pain medicine. 30 capsule 0   docusate sodium (COLACE) 100 MG capsule Take 1 capsule (100 mg total) by mouth 2 (two) times daily. While taking narcotic pain medicine. 30 capsule 0   Emollient (BAG BALM EX) Apply 1 application topically daily.     ferrous sulfate 325 (65 FE) MG tablet Take 1 tablet (325 mg total) by mouth daily with breakfast. 90 tablet 1   glucose blood (ACCU-CHEK AVIVA) test strip Use to check blood glucose up to three times a day. Dx code E 11.9 100 each 12  lisinopril (ZESTRIL) 20 MG tablet Take 1 tablet (20 mg total) by mouth daily. 90 tablet 3   methocarbamol (ROBAXIN) 500 MG tablet Take 500 mg by mouth every 6 (six) hours as needed for pain.     Multiple Vitamin (MULTIVITAMIN) tablet Take 1 tablet by mouth daily. 50 plus     Netarsudil-Latanoprost (ROCKLATAN) 0.02-0.005 % SOLN Place 1 drop into both eyes daily at 10 pm.     pantoprazole (PROTONIX) 20 MG tablet Take 1 tablet (20 mg total) by mouth 2 (two) times daily. 180 tablet 2   Polyvinyl Alcohol-Povidone (REFRESH OP) Place 1 drop into both eyes daily as needed (dry eyes).     senna (SENOKOT) 8.6 MG TABS tablet Take 2 tablets (17.2 mg total) by mouth 2 (two) times daily. 30 tablet 0   senna (SENOKOT) 8.6 MG TABS tablet Take 2 tablets (17.2  mg total) by mouth 2 (two) times daily. 30 tablet 0   SitaGLIPtin-MetFORMIN HCl (JANUMET XR) 50-500 MG TB24 Take 1 tablet by mouth 2 (two) times daily. 180 tablet 0   Vitamin A 2400 MCG (8000 UT) TABS Take 8,000 Units by mouth daily.     vitamin B-12 (CYANOCOBALAMIN) 500 MCG tablet Take 500 mcg by mouth daily.     vitamin E 180 MG (400 UNITS) capsule Take 400 Units by mouth daily.     Zinc 50 MG CAPS Take 50 mg by mouth daily.      [DISCONTINUED] pravastatin (PRAVACHOL) 20 MG tablet Take 1 tablet (20 mg total) by mouth daily. 30 tablet 6   No current facility-administered medications on file prior to visit.    Family History  Problem Relation Age of Onset   Diabetes Mother    Hypertension Mother    Dementia Mother    Heart disease Father    Stroke Father    Diabetes Father    Hypertension Father    Gout Father    Deep vein thrombosis Father    Breast cancer Cousin    Breast cancer Cousin     Social History   Socioeconomic History   Marital status: Single    Spouse name: Not on file   Number of children: Not on file   Years of education: Not on file   Highest education level: Not on file  Occupational History   Occupation: Disabled  Tobacco Use   Smoking status: Former    Packs/day: 0.50    Years: 0.00    Pack years: 0.00    Types: Cigarettes    Quit date: 12/22/1972    Years since quitting: 48.1   Smokeless tobacco: Never  Vaping Use   Vaping Use: Never used  Substance and Sexual Activity   Alcohol use: No   Drug use: No   Sexual activity: Not Currently  Other Topics Concern   Not on file  Social History Narrative   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   Smoked when she was a teenager   no alcohol   no Drugs      Current Social History 10/26/2018        Patient lives on the main level of a 3 floor boarding house with 3 other tenants. There are 3 steps with handrails up to the entrance the patient uses.       Patient's  method of transportation is personal car.      The highest level of education was Bachelor's Degree.  The patient currently disabled.      Identified important Relationships are "My cousins, Jonette Eva and Ilean Skill, and my dear friend Dimas Chyle."      Pets : None       Interests / Fun: "I love to read, write, photography: go around [place] taking photos of the [place], [employer], Art Museums, cooking, hanging out with friends, several ministries with my [place], especially working with the youth."      Current Stressors: "Worrying about someone to care for me if anything happens to me. 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Ducatte, BSN, RN-BC          Social Determinants of Health   Financial Resource Strain: Not on file  Food Insecurity: Not on file  Transportation Needs: Not on file  Physical Activity: Not on file  Stress: Not on file  Social Connections: Not on file  Intimate Partner Violence: Not on file    Review of Systems: ROS negative except for what is noted on the assessment and plan.  Objective:   Vitals:   02/18/21 1315 02/18/21 1348  BP: (!) 145/60 (!) 115/50  Pulse: 67 65  Temp: 98.1 F (36.7 C)   TempSrc: Oral   SpO2: 99%   Weight: 246 lb 12.8 oz (111.9 kg)     Physical Exam: Gen: A&O x3 and in no apparent distress, well appearing and nourished. Neck: no masses or nodules, AROM intact. CV: RRR, no murmurs, S1/S2 presents  Resp: Clear to ascultation bilaterally  Abd: BS (+) x4, soft, non-tender abdomen, without hepatosplenomegaly or masses    Assessment & Plan:  See Encounters Tab for problem based charting.  Patient discussed with Dr. Kelle Darting, D.O. [facility]   PGY-3 Pager: [phone]   Phone: [phone] Date 02/19/2021   Time 9:13 AM

## 2021-02-19 ENCOUNTER — Encounter: Payer: Self-pay | Admitting: Internal Medicine

## 2021-02-19 LAB — BMP8+ANION GAP
Anion Gap: 13 mmol/L (ref 10.0–18.0)
BUN/Creatinine Ratio: 17 (ref 12–28)
BUN: 25 mg/dL (ref 8–27)
CO2: 27 mmol/L (ref 20–29)
Calcium: 10.1 mg/dL (ref 8.7–10.3)
Chloride: 99 mmol/L (ref 96–106)
Creatinine, Ser: 1.46 mg/dL — ABNORMAL HIGH (ref 0.57–1.00)
Glucose: 87 mg/dL (ref 70–99)
Potassium: 4.6 mmol/L (ref 3.5–5.2)
Sodium: 139 mmol/L (ref 134–144)
eGFR: 40 mL/min/{1.73_m2} — ABNORMAL LOW (ref >59)

## 2021-02-19 NOTE — Assessment & Plan Note (Addendum)
HPI: Patient presents for further evaluation and management of hypertension.  Blood pressures are better controlled since recently starting lisinopril.  She denies any medication side effects and is tolerating it well.  BP Readings from Last 3 Encounters:  02/18/21 (!) 115/50  01/21/21 (!) 141/63  12/28/20 135/80   Home blood pressure cuff was compared to an office cuff and was relatively similar.   Assessment/Plan: Essential hypertension: -Continue current antihypertensive medications. -We will get BMP today to assess kidney function and electrolytes.    **ADDENDUM**  BMP showed stable kidney function and electrolytes.

## 2021-02-22 NOTE — Progress Notes (Signed)
Internal Medicine Clinic Attending  Case discussed with Dr. Coe  At the time of the visit.  We reviewed the resident's history and exam and pertinent patient test results.  I agree with the assessment, diagnosis, and plan of care documented in the resident's note.  

## 2021-03-03 ENCOUNTER — Ambulatory Visit: Payer: Medicare Other | Admitting: Infectious Disease

## 2021-03-03 NOTE — Progress Notes (Signed)
Subjective:  Chief complaint: Follow-up for hardware associated osteomyelitis   Patient ID: [name], female    DOB: 12-01-1955, 66 y.o.   MRN: [id number]  HPI  [name] is a 66 y.o. female with history of hypertension diabetes mellitus Charcot arthropathy who is followed by my partner Dr. Megan Salon in the past when she had had had evidence of calcaneal osteomyelitis.  She had a bone biopsy in February 2021 which grew Citrobacter Streptococcus mitis paralysis and Streptococcus anginosus.  She was treated with ceftriaxone and oral metronidazole and completed 8 weeks of therapy before switching to Keflex and metronidazole and completing 12 weeks in its entirety.   After that she later developed drainage and an ulcer on her right foot for which she was followed by Dr. Dellia Nims and wound care.  Cultures from October 2021 grew and imipenem resistant Pseudomonas aeruginosa and an MRSA that was resistant to Bactrim.  Since then he subsequent underwent midfoot arthrodesis for Charcot arthropathy and this right foot.  She developed painful hardware at the site of arthrodesis.  She had been seen by [name] in the office and had placed her on doxycycline.  Ultimately decision was made to take to the operating room to remove hardware.  In the operating room [name] found fluctuance dorsal to the ulcer and excised to this as well as the screw head that was identified and removed along with washer with debridement from the skin down through the subcutaneous tissue and into the bone of the screw tract.  Material at the dorsal wound was also excised and sent for culture.   At the time I last saw her cultures were still incubating.  I decided to go with an empiric regimen of daptomycin and and cefepime which she has been receiving.  Note the cefepime was adjusted to twice daily due to her renal function having worsened.    Since then cultures have turned positive for Staph epidermidis  Pseudomonas aeruginosa at this time not resistant to carbapenems and beta lactamase positive Bacteroides fragilis.  When the last organism was isolated twice daily metronidazole was added by the primary team.  She completed her 6 weeks of antibiotics and has done well she has had no recurrence of pain in her foot no fevers chills malaise nausea or other systemic symptoms to suggest infection.       Past Medical History:  Diagnosis Date   Arthritis    Chronic normocytic anemia    CKD (chronic kidney disease) stage 3, GFR 30-59 ml/min ([place]) 02/02/2013   Diabetes mellitus type II, controlled ([place]) 06/17/2009   Diabetic ulcer of both feet ([place]) 01/07/2010   Qualifier: Diagnosis of  By: Ina Homes MD, Amanjot     Essential hypertension 06/17/2009   GERD (gastroesophageal reflux disease)    Glaucoma    H/O hiatal hernia    Hardware complicating wound infection ([place]) 12/28/2020   Hyperlipidemia    MRSA (methicillin resistant Staphylococcus aureus)    Obstructive sleep apnea 05/26/2010   Polymicrobial bacterial infection 12/28/2020    Past Surgical History:  Procedure Laterality Date   ACHILLES TENDON LENGTHENING Left 11/06/2014   Procedure: LEFT ACHILLES TENDON LENGTHENING, ;  Surgeon: [name] Simmer, MD;  Location: [facility];  Service: Orthopedics;  Laterality: Left;   APPENDECTOMY  1975   FOOT ARTHRODESIS Left 11/06/2014   Procedure: TALONAVICULAR CANCELLOUS CUBOID ARTHRODESIS WITH CORRECTIVE OSTEOTOMY ;  Surgeon: [name] Simmer, MD;  Location: [facility];  Service: Orthopedics;  Laterality: Left;  FOOT ARTHRODESIS Right 05/14/2020   Procedure: Right heelcord lengthening, subtalar and talonavicular arthrodesis, talus exostectomy, talus corrective osteotomy;  Surgeon: [name] Simmer, MD;  Location: [facility];  Service: Orthopedics;  Laterality: Right;   HARDWARE REMOVAL Left 02/19/2015   Procedure: REMOVAL OF LEFT FOOT EXTERNAL FIXATOR, CASTING OF LEFT ANKLE ;  Surgeon: [name] Simmer, MD;  Location: [facility];  Service: Orthopedics;  Laterality: Left;   HARDWARE REMOVAL Right 12/17/2020   Procedure: Removal of deep implant right foot;  Surgeon: [name] Simmer, MD;  Location: [facility];  Service: Orthopedics;  Laterality: Right;   I & D EXTREMITY Left 2011   "ulcer on my foot got infected" (01/30/2013)   MULTIPLE TOOTH EXTRACTIONS Bilateral    OOPHORECTOMY Bilateral 1975-1976    Family History  Problem Relation Age of Onset   Diabetes Mother    Hypertension Mother    Dementia Mother    Heart disease Father    Stroke Father    Diabetes Father    Hypertension Father    Gout Father    Deep vein thrombosis Father    Breast cancer Cousin    Breast cancer Cousin       Social History   Socioeconomic History   Marital status: Single    Spouse name: Not on file   Number of children: Not on file   Years of education: Not on file   Highest education level: Not on file  Occupational History   Occupation: Disabled  Tobacco Use   Smoking status: Former    Packs/day: 0.50    Years: 0.00    Pack years: 0.00    Types: Cigarettes    Quit date: 12/22/1972    Years since quitting: 48.2   Smokeless tobacco: Never  Vaping Use   Vaping Use: Never used  Substance and Sexual Activity   Alcohol use: No   Drug use: No   Sexual activity: Not Currently  Other Topics Concern   Not on file  Social History Narrative   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   Smoked when she was a teenager   no alcohol   no Drugs      Current Social History 10/26/2018        Patient lives on the main level of a 3 floor boarding house with 3 other tenants. There are 3 steps with handrails up to the entrance the patient uses.       Patient's method of transportation is personal car.      The highest level of education was Bachelor's Degree.      The patient currently disabled.      Identified important Relationships are "My cousins, Jonette Eva and Ilean Skill, and my dear friend Dimas Chyle."      Pets : None       Interests / Fun: "I love to read, write, photography: go around [place] taking photos of the [place], [employer], Art Museums, cooking, hanging out with friends, several ministries with my [place], especially working with the youth."      Current Stressors: "Worrying about someone to care for me if anything happens to me. 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Ducatte, BSN, RN-BC          Social Determinants of Health   Financial Resource Strain: Not on  file  Food Insecurity: Not on file  Transportation Needs: Not on file  Physical Activity: Not on file  Stress: Not on file  Social Connections: Not on file    Allergies  Allergen Reactions   Codeine Itching and Swelling    swelling in throat   Pravastatin Other (See Comments)    cramps   Latex Rash     Current Outpatient Medications:    Accu-Chek Softclix Lancets lancets, Use to check blood glucose up to three times a day. Dx code E 11.9, Disp: 100 each, Rfl: 12   acetaminophen (TYLENOL) 500 MG tablet, Take 500-1,000 mg by mouth every 6 (six) hours as needed for moderate pain or headache., Disp: , Rfl:    Ascorbic Acid (VITAMIN C) 1000 MG tablet, Take 1,000 mg by mouth daily. Rose hips, Disp: , Rfl:    aspirin EC 81 MG tablet, Take 81 mg by mouth daily. Swallow whole., Disp: , Rfl:    atorvastatin (LIPITOR) 40 MG tablet, Take 1 tablet (40 mg total) by mouth daily., Disp: 90 tablet, Rfl: 3   Biotin 5000 MCG CAPS, Take 5,000 mcg by mouth daily., Disp: , Rfl:    Blood Glucose Monitoring Suppl (ACCU-CHEK AVIVA PLUS) w/Device KIT, Use to monitor blood sugars, Disp: 1 kit, Rfl: 0   Blood Pressure Monitoring (ADULT BLOOD PRESSURE CUFF LG) KIT, Use to monitor your BP daily and bring log during each visit, Disp: 1 kit, Rfl: 0   brimonidine-timolol (COMBIGAN) 0.2-0.5 % ophthalmic solution,  Place 1 drop into both eyes every 12 (twelve) hours. Place one drop to both eyes twice daily for glaucoma (Patient taking differently: Place 1 drop into both eyes 2 (two) times daily. 8 am and 1700), Disp: 15 mL, Rfl: 0   calcium carbonate (TUMS - DOSED IN MG ELEMENTAL CALCIUM) 500 MG chewable tablet, Chew 500-1,000 mg by mouth daily as needed for indigestion or heartburn., Disp: , Rfl:    Calcium Carbonate-Vitamin D (CALCIUM 600+D PO), Take 600 mg by mouth daily., Disp: , Rfl:    Cholecalciferol (VITAMIN D) 50 MCG (2000 UT) tablet, Take 2,000 Units by mouth daily., Disp: , Rfl:    COLLAGEN PO, Take 100 mg by mouth daily., Disp: , Rfl:    cycloSPORINE (RESTASIS) 0.05 % ophthalmic emulsion, Place 1 drop into both eyes 2 (two) times daily. 8 am and 1700, Disp: , Rfl:    docusate sodium (COLACE) 100 MG capsule, Take 1 capsule (100 mg total) by mouth 2 (two) times daily. While taking narcotic pain medicine., Disp: 30 capsule, Rfl: 0   docusate sodium (COLACE) 100 MG capsule, Take 1 capsule (100 mg total) by mouth 2 (two) times daily. While taking narcotic pain medicine., Disp: 30 capsule, Rfl: 0   Emollient (BAG BALM EX), Apply 1 application topically daily., Disp: , Rfl:    ferrous sulfate 325 (65 FE) MG tablet, Take 1 tablet (325 mg total) by mouth daily with breakfast., Disp: 90 tablet, Rfl: 1   glucose blood (ACCU-CHEK AVIVA) test strip, Use to check blood glucose up to three times a day. Dx code E 11.9, Disp: 100 each, Rfl: 12   lisinopril (ZESTRIL) 20 MG tablet, Take 1 tablet (20 mg total) by mouth daily., Disp: 90 tablet, Rfl: 3   methocarbamol (ROBAXIN) 500 MG tablet, Take 500 mg by mouth every 6 (six) hours as needed for pain., Disp: , Rfl:    Multiple Vitamin (MULTIVITAMIN) tablet, Take 1 tablet by mouth daily. 50 plus, Disp: , Rfl:  Netarsudil-Latanoprost (ROCKLATAN) 0.02-0.005 % SOLN, Place 1 drop into both eyes daily at 10 pm., Disp: , Rfl:    pantoprazole (PROTONIX) 20 MG tablet, Take 1  tablet (20 mg total) by mouth 2 (two) times daily., Disp: 180 tablet, Rfl: 2   Polyvinyl Alcohol-Povidone (REFRESH OP), Place 1 drop into both eyes daily as needed (dry eyes)., Disp: , Rfl:    senna (SENOKOT) 8.6 MG TABS tablet, Take 2 tablets (17.2 mg total) by mouth 2 (two) times daily., Disp: 30 tablet, Rfl: 0   senna (SENOKOT) 8.6 MG TABS tablet, Take 2 tablets (17.2 mg total) by mouth 2 (two) times daily., Disp: 30 tablet, Rfl: 0   SitaGLIPtin-MetFORMIN HCl (JANUMET XR) 50-500 MG TB24, Take 1 tablet by mouth 2 (two) times daily., Disp: 180 tablet, Rfl: 0   Vitamin A 2400 MCG (8000 UT) TABS, Take 8,000 Units by mouth daily., Disp: , Rfl:    vitamin B-12 (CYANOCOBALAMIN) 500 MCG tablet, Take 500 mcg by mouth daily., Disp: , Rfl:    vitamin E 180 MG (400 UNITS) capsule, Take 400 Units by mouth daily., Disp: , Rfl:    Zinc 50 MG CAPS, Take 50 mg by mouth daily. , Disp: , Rfl:     Review of Systems  Constitutional:  Negative for activity change, appetite change, chills, diaphoresis, fatigue, fever and unexpected weight change.  HENT:  Negative for congestion, rhinorrhea, sinus pressure, sneezing, sore throat and trouble swallowing.   Eyes:  Negative for photophobia and visual disturbance.  Respiratory:  Negative for cough, chest tightness, shortness of breath, wheezing and stridor.   Cardiovascular:  Negative for chest pain, palpitations and leg swelling.  Gastrointestinal:  Negative for abdominal distention, abdominal pain, anal bleeding, blood in stool, constipation, diarrhea, nausea and vomiting.  Genitourinary:  Negative for difficulty urinating, dysuria, flank pain and hematuria.  Musculoskeletal:  Negative for arthralgias, back pain, gait problem, joint swelling and myalgias.  Skin:  Negative for color change, pallor, rash and wound.  Neurological:  Negative for dizziness, tremors, weakness and light-headedness.  Hematological:  Negative for adenopathy. Does not bruise/bleed easily.   Psychiatric/Behavioral:  Negative for agitation, behavioral problems, confusion, decreased concentration, dysphoric mood and sleep disturbance.       Objective:   Physical Exam Constitutional:      General: She is not in acute distress.    Appearance: Normal appearance. She is well-developed. She is not ill-appearing or diaphoretic.  HENT:     Head: Normocephalic and atraumatic.     Right Ear: Hearing and external ear normal.     Left Ear: Hearing and external ear normal.     Nose: No nasal deformity or rhinorrhea.  Eyes:     General: No scleral icterus.    Conjunctiva/sclera: Conjunctivae normal.     Right eye: Right conjunctiva is not injected.     Left eye: Left conjunctiva is not injected.     Pupils: Pupils are equal, round, and reactive to light.  Neck:     Vascular: No JVD.  Cardiovascular:     Rate and Rhythm: Normal rate and regular rhythm.     Heart sounds: S1 normal and S2 normal.    No friction rub.  Pulmonary:     Effort: Pulmonary effort is normal. No respiratory distress.     Breath sounds: No wheezing.  Abdominal:     General: Bowel sounds are normal. There is no distension.     Palpations: Abdomen is soft.     Tenderness:  There is no abdominal tenderness.  Musculoskeletal:        General: Normal range of motion.     Right shoulder: Normal.     Left shoulder: Normal.     Cervical back: Normal range of motion and neck supple.     Right hip: Normal.     Left hip: Normal.     Right knee: Normal.     Left knee: Normal.  Lymphadenopathy:     Head:     Right side of head: No submandibular, preauricular or posterior auricular adenopathy.     Left side of head: No submandibular, preauricular or posterior auricular adenopathy.     Cervical: No cervical adenopathy.     Right cervical: No superficial or deep cervical adenopathy.    Left cervical: No superficial or deep cervical adenopathy.  Skin:    General: Skin is warm and dry.     Coloration: Skin is not  pale.     Findings: No abrasion, bruising, ecchymosis, erythema, lesion or rash.     Nails: There is no clubbing.  Neurological:     General: No focal deficit present.     Mental Status: She is alert and oriented to person, place, and time.     Sensory: No sensory deficit.     Coordination: Coordination normal.     Gait: Gait normal.  Psychiatric:        Attention and Perception: She is attentive.        Mood and Affect: Mood normal.        Speech: Speech normal.        Behavior: Behavior normal. Behavior is cooperative.        Thought Content: Thought content normal.        Judgment: Judgment normal.    Foot March 04, 2021:       Assessment & Plan:   Polymicrobial hardware associated osteomyelitis: She is status post removal of hardware bone and status post 6 weeks of antibiotics.  I will check a sed rate CRP CBC and CMP today  Provided these are normal she can just follow-up as needed.    HTN: He is continuing on lisinopril    CKD  It has been relatively stable rechecking CMP today  Vaccine counseling recommended Prevnar 20 and Bivalent COVID 19 vaccine.

## 2021-03-04 ENCOUNTER — Other Ambulatory Visit: Payer: Self-pay

## 2021-03-04 ENCOUNTER — Encounter: Payer: Self-pay | Admitting: Infectious Disease

## 2021-03-04 ENCOUNTER — Ambulatory Visit (INDEPENDENT_AMBULATORY_CARE_PROVIDER_SITE_OTHER): Payer: Medicare Other | Admitting: Infectious Disease

## 2021-03-04 VITALS — BP 135/78 | HR 78 | Temp 97.7°F | Ht 68.0 in | Wt 246.0 lb

## 2021-03-04 DIAGNOSIS — Z23 Encounter for immunization: Secondary | ICD-10-CM | POA: Diagnosis not present

## 2021-03-04 DIAGNOSIS — I129 Hypertensive chronic kidney disease with stage 1 through stage 4 chronic kidney disease, or unspecified chronic kidney disease: Secondary | ICD-10-CM | POA: Diagnosis not present

## 2021-03-04 DIAGNOSIS — A499 Bacterial infection, unspecified: Secondary | ICD-10-CM

## 2021-03-04 DIAGNOSIS — E1169 Type 2 diabetes mellitus with other specified complication: Secondary | ICD-10-CM

## 2021-03-04 DIAGNOSIS — N1832 Chronic kidney disease, stage 3b: Secondary | ICD-10-CM

## 2021-03-04 DIAGNOSIS — E1122 Type 2 diabetes mellitus with diabetic chronic kidney disease: Secondary | ICD-10-CM | POA: Diagnosis not present

## 2021-03-04 DIAGNOSIS — T847XXD Infection and inflammatory reaction due to other internal orthopedic prosthetic devices, implants and grafts, subsequent encounter: Secondary | ICD-10-CM | POA: Diagnosis not present

## 2021-03-04 DIAGNOSIS — I10 Essential (primary) hypertension: Secondary | ICD-10-CM

## 2021-03-04 DIAGNOSIS — Z7185 Encounter for immunization safety counseling: Secondary | ICD-10-CM

## 2021-03-05 LAB — BASIC METABOLIC PANEL WITH GFR
BUN/Creatinine Ratio: 17 (calc) (ref 6–22)
BUN: 25 mg/dL (ref 7–25)
CO2: 28 mmol/L (ref 20–32)
Calcium: 9.7 mg/dL (ref 8.6–10.4)
Chloride: 103 mmol/L (ref 98–110)
Creat: 1.49 mg/dL — ABNORMAL HIGH (ref 0.50–1.05)
Glucose, Bld: 144 mg/dL — ABNORMAL HIGH (ref 65–99)
Potassium: 4.5 mmol/L (ref 3.5–5.3)
Sodium: 139 mmol/L (ref 135–146)
eGFR: 39 mL/min/{1.73_m2} — ABNORMAL LOW (ref >=60)

## 2021-03-05 LAB — CBC WITH DIFFERENTIAL/PLATELET
Absolute Monocytes: 504 cells/uL (ref 200–950)
Basophils Absolute: 38 cells/uL (ref 0–200)
Basophils Relative: 0.6 %
Eosinophils Absolute: 233 cells/uL (ref 15–500)
Eosinophils Relative: 3.7 %
HCT: 34.9 % — ABNORMAL LOW (ref 35.0–45.0)
Hemoglobin: 11.2 g/dL — ABNORMAL LOW (ref 11.7–15.5)
Lymphs Abs: 1512 cells/uL (ref 850–3900)
MCH: 28.5 pg (ref 27.0–33.0)
MCHC: 32.1 g/dL (ref 32.0–36.0)
MCV: 88.8 fL (ref 80.0–100.0)
MPV: 10.9 fL (ref 7.5–12.5)
Monocytes Relative: 8 %
Neutro Abs: 4013 cells/uL (ref 1500–7800)
Neutrophils Relative %: 63.7 %
Platelets: 204 10*3/uL (ref 140–400)
RBC: 3.93 10*6/uL (ref 3.80–5.10)
RDW: 15.3 % — ABNORMAL HIGH (ref 11.0–15.0)
Total Lymphocyte: 24 %
WBC: 6.3 10*3/uL (ref 3.8–10.8)

## 2021-03-05 LAB — SEDIMENTATION RATE: Sed Rate: 33 mm/h — ABNORMAL HIGH (ref 0–30)

## 2021-03-05 LAB — C-REACTIVE PROTEIN: CRP: 3.4 mg/L (ref <8.0)

## 2021-03-18 ENCOUNTER — Ambulatory Visit (INDEPENDENT_AMBULATORY_CARE_PROVIDER_SITE_OTHER): Payer: Medicare Other | Admitting: Internal Medicine

## 2021-03-18 VITALS — BP 124/59 | HR 69 | Temp 98.0°F | Ht 68.0 in | Wt 250.4 lb

## 2021-03-18 DIAGNOSIS — M869 Osteomyelitis, unspecified: Secondary | ICD-10-CM

## 2021-03-18 DIAGNOSIS — N1832 Chronic kidney disease, stage 3b: Secondary | ICD-10-CM | POA: Diagnosis not present

## 2021-03-18 DIAGNOSIS — Z0000 Encounter for general adult medical examination without abnormal findings: Secondary | ICD-10-CM

## 2021-03-18 DIAGNOSIS — Z23 Encounter for immunization: Secondary | ICD-10-CM | POA: Diagnosis not present

## 2021-03-18 DIAGNOSIS — I10 Essential (primary) hypertension: Secondary | ICD-10-CM

## 2021-03-18 DIAGNOSIS — E1169 Type 2 diabetes mellitus with other specified complication: Secondary | ICD-10-CM

## 2021-03-18 DIAGNOSIS — I129 Hypertensive chronic kidney disease with stage 1 through stage 4 chronic kidney disease, or unspecified chronic kidney disease: Secondary | ICD-10-CM

## 2021-03-18 DIAGNOSIS — E785 Hyperlipidemia, unspecified: Secondary | ICD-10-CM

## 2021-03-18 DIAGNOSIS — E1122 Type 2 diabetes mellitus with diabetic chronic kidney disease: Secondary | ICD-10-CM | POA: Diagnosis not present

## 2021-03-18 LAB — POCT GLYCOSYLATED HEMOGLOBIN (HGB A1C): Hemoglobin A1C: 6.2 % — AB (ref 4.0–5.6)

## 2021-03-18 LAB — GLUCOSE, CAPILLARY: Glucose-Capillary: 115 mg/dL — ABNORMAL HIGH (ref 70–99)

## 2021-03-18 NOTE — Progress Notes (Addendum)
? ?CC: routine follow up for DM2  ? ?HPI: ? ?[name] is a 66 y.o. female with a PMHx stated below and presents today for stated above. Please see the Encounters tab for problem-based Assessment & Plan for additional details.  ? ?Past Medical History:  ?Diagnosis Date  ? Arthritis   ? Chronic normocytic anemia   ? CKD (chronic kidney disease) stage 3, GFR 30-59 ml/min (HCC) 02/02/2013  ? Diabetes mellitus type II, controlled ([place]) 06/17/2009  ? Diabetic ulcer of both feet ([place]) 01/07/2010  ? Qualifier: Diagnosis of  By: Ina Homes MD, [place]    ? Essential hypertension 06/17/2009  ? GERD (gastroesophageal reflux disease)   ? Glaucoma   ? H/O hiatal hernia   ? Hardware complicating wound infection ([place]) 12/28/2020  ? Hyperlipidemia   ? MRSA (methicillin resistant Staphylococcus aureus)   ? Obstructive sleep apnea 05/26/2010  ? Polymicrobial bacterial infection 12/28/2020  ? ? ?Current Outpatient Medications on File Prior to Visit  ?Medication Sig Dispense Refill  ? Accu-Chek Softclix Lancets lancets Use to check blood glucose up to three times a day. Dx code E 11.9 100 each 12  ? acetaminophen (TYLENOL) 500 MG tablet Take 500-1,000 mg by mouth every 6 (six) hours as needed for moderate pain or headache.    ? Ascorbic Acid (VITAMIN C) 1000 MG tablet Take 1,000 mg by mouth daily. [place] hips    ? aspirin EC 81 MG tablet Take 81 mg by mouth daily. Swallow whole.    ? atorvastatin (LIPITOR) 40 MG tablet Take 1 tablet (40 mg total) by mouth daily. 90 tablet 3  ? Biotin 5000 MCG CAPS Take 5,000 mcg by mouth daily.    ? Blood Glucose Monitoring Suppl (ACCU-CHEK AVIVA PLUS) w/Device KIT Use to monitor blood sugars 1 kit 0  ? Blood Pressure Monitoring (ADULT BLOOD PRESSURE CUFF LG) KIT Use to monitor your BP daily and bring log during each visit 1 kit 0  ? brimonidine-timolol (COMBIGAN) 0.2-0.5 % ophthalmic solution Place 1 drop into both eyes every 12 (twelve) hours. Place one drop to both eyes twice daily for glaucoma  (Patient taking differently: Place 1 drop into both eyes 2 (two) times daily. 8 am and 1700) 15 mL 0  ? calcium carbonate (TUMS - DOSED IN MG ELEMENTAL CALCIUM) 500 MG chewable tablet Chew 500-1,000 mg by mouth daily as needed for indigestion or heartburn.    ? Calcium Carbonate-Vitamin D (CALCIUM 600+D PO) Take 600 mg by mouth daily.    ? Cholecalciferol (VITAMIN D) 50 MCG (2000 UT) tablet Take 2,000 Units by mouth daily.    ? COLLAGEN PO Take 100 mg by mouth daily.    ? cycloSPORINE (RESTASIS) 0.05 % ophthalmic emulsion Place 1 drop into both eyes 2 (two) times daily. 8 am and 1700    ? docusate sodium (COLACE) 100 MG capsule Take 1 capsule (100 mg total) by mouth 2 (two) times daily. While taking narcotic pain medicine. 30 capsule 0  ? docusate sodium (COLACE) 100 MG capsule Take 1 capsule (100 mg total) by mouth 2 (two) times daily. While taking narcotic pain medicine. 30 capsule 0  ? Emollient (BAG BALM EX) Apply 1 application topically daily.    ? ferrous sulfate 325 (65 FE) MG tablet Take 1 tablet (325 mg total) by mouth daily with breakfast. 90 tablet 1  ? glucose blood (ACCU-CHEK AVIVA) test strip Use to check blood glucose up to three times a day. Dx code E 11.9  100 each 12  ? lisinopril (ZESTRIL) 20 MG tablet Take 1 tablet (20 mg total) by mouth daily. 90 tablet 3  ? methocarbamol (ROBAXIN) 500 MG tablet Take 500 mg by mouth every 6 (six) hours as needed for pain.    ? Multiple Vitamin (MULTIVITAMIN) tablet Take 1 tablet by mouth daily. 50 plus    ? Netarsudil-Latanoprost (ROCKLATAN) 0.02-0.005 % SOLN Place 1 drop into both eyes daily at 10 pm.    ? pantoprazole (PROTONIX) 20 MG tablet Take 1 tablet (20 mg total) by mouth 2 (two) times daily. 180 tablet 2  ? Polyvinyl Alcohol-Povidone (REFRESH OP) Place 1 drop into both eyes daily as needed (dry eyes).    ? senna (SENOKOT) 8.6 MG TABS tablet Take 2 tablets (17.2 mg total) by mouth 2 (two) times daily. 30 tablet 0  ? senna (SENOKOT) 8.6 MG TABS tablet Take  2 tablets (17.2 mg total) by mouth 2 (two) times daily. 30 tablet 0  ? SitaGLIPtin-MetFORMIN HCl (JANUMET XR) 50-500 MG TB24 Take 1 tablet by mouth 2 (two) times daily. 180 tablet 0  ? Vitamin A 2400 MCG (8000 UT) TABS Take 8,000 Units by mouth daily.    ? vitamin B-12 (CYANOCOBALAMIN) 500 MCG tablet Take 500 mcg by mouth daily.    ? vitamin E 180 MG (400 UNITS) capsule Take 400 Units by mouth daily.    ? Zinc 50 MG CAPS Take 50 mg by mouth daily.     ? [DISCONTINUED] pravastatin (PRAVACHOL) 20 MG tablet Take 1 tablet (20 mg total) by mouth daily. 30 tablet 6  ? ?No current facility-administered medications on file prior to visit.  ? ? ?Family History  ?Problem Relation Age of Onset  ? Diabetes Mother   ? Hypertension Mother   ? Dementia Mother   ? Heart disease Father   ? Stroke Father   ? Diabetes Father   ? Hypertension Father   ? Gout Father   ? Deep vein thrombosis Father   ? Breast cancer Cousin   ? Breast cancer Cousin   ? ? ?Social History  ? ?Socioeconomic History  ? Marital status: Single  ?  Spouse name: Not on file  ? Number of children: Not on file  ? Years of education: Not on file  ? Highest education level: Not on file  ?Occupational History  ? Occupation: Disabled  ?Tobacco Use  ? Smoking status: Former  ?  Packs/day: 0.50  ?  Years: 0.00  ?  Pack years: 0.00  ?  Types: Cigarettes  ?  Quit date: 12/22/1972  ?  Years since quitting: 48.2  ? Smokeless tobacco: Never  ?Vaping Use  ? Vaping Use: Never used  ?Substance and Sexual Activity  ? Alcohol use: No  ? Drug use: No  ? Sexual activity: Not Currently  ?Other Topics Concern  ? Not on file  ?Social History Narrative  ? Teacher special education  ? BA in behavioral science  ? Single, no kids.  ? Smoked when she was a teenager  ? no alcohol  ? no Drugs  ?   ? Current Social History 10/26/2018    ?   ? Patient lives on the main level of a 3 floor boarding house with 3 other tenants. There are 3 steps with handrails up to the entrance the patient uses.   ?    ? Patient's method of transportation is personal car.  ?   ? The highest level of education was  Bachelor's Degree.  ?   ? The patient currently disabled.  ?   ? Identified important Relationships are "My cousins, Jonette Eva and Ilean Skill, and my dear friend Dimas Chyle."  ?   ? Pets : None  ?    ? Interests / Fun: "I love to read, write, photography: go around [place] taking photos of the [place], [employer], [place], cooking, hanging out with friends, several ministries with my [place], especially working with the youth."  ?   ? Current Stressors: "Worrying about someone to care for me if anything happens to me. Being closed in (2/2 Covid)"   ?   ? Religious / Personal Beliefs: Protestant ([employer])   ?   ? L. [place], BSN, RN-BC   ?   ?   ? ?Social Determinants of Health  ? ?Financial Resource Strain: Not on file  ?Food Insecurity: Not on file  ?Transportation Needs: Not on file  ?Physical Activity: Not on file  ?Stress: Not on file  ?Social Connections: Not on file  ?Intimate Partner Violence: Not on file  ? ? ?Review of Systems: ?ROS negative except for what is noted on the assessment and plan. ? ?Vitals:  ? 03/18/21 1319  ?BP: (!) 124/59  ?Pulse: 69  ?Temp: 98 ?F (36.7 ?C)  ?TempSrc: Oral  ?SpO2: 100%  ?Weight: 250 lb 6.4 oz (113.6 kg)  ?Height: 5' 8"  (1.727 m)  ? ?Physical Exam: ?Constitutional: alert, well-appearing, in NAD ?Eyes: conjunctiva non-erythematous, EOMI ?Cardiovascular: RRR, no m/r/g, non-edematous bilateral LE ?Pulmonary/Chest: normal work of breathing on RA, LCTAB ?Abdominal: soft, non-tender to palpation, non-distended ?MSK: normal bulk and tone  ?Neurological: A&O x 3 and follows commands  ?Skin: warm and dry  ? ?Assessment & Plan:  ? ?See Encounters Tab for problem based charting. ? ?Patient discussed with Dr. Saverio Danker ? ?Lajean Manes, MD  ?Internal Medicine Resident, PGY-1 ?[facility]  ?

## 2021-03-18 NOTE — Assessment & Plan Note (Addendum)
Lipid panel ordered ?Mammogram ordered last visit-- scheduled for 5/16 ?DEXA scan ordered last visit-- scheduled for 5/16 ?Pap smear deferred to next visit  ?Educated to get COVID booster   ?Reports that she got the Zoster shot at [facility] ?Tetanus received today  ?

## 2021-03-18 NOTE — Assessment & Plan Note (Signed)
BMP obtained 2/13 with stable renal function. Good HTN and DM medication compliance. Will continue to monitor.  ? ?BMP during next visit  ?

## 2021-03-18 NOTE — Assessment & Plan Note (Addendum)
She is status post removal of hardware bone and status post 6 weeks of antibiotics. Had been following Dr Daiva Eves in the OP setting. She is cleared from [facility], with instructions to follow up as needed. No systemic symptoms today; she does not complain of pain or drainage.  ? ?

## 2021-03-18 NOTE — Patient Instructions (Signed)
Please continue taking your medications daily- no changes today ?You can stop monitoring your sugar at home- we will check your A1c in 3 months ?Please go to your appt for mammogram and DEXA scan in May ?Please get COVID booster at your pharmacy  ?Come back for a pap smear in 1 month ?

## 2021-03-18 NOTE — Addendum Note (Signed)
Addended byCarmel Sacramento on: 03/18/2021 03:31 PM ? ? Modules accepted: Orders ? ?

## 2021-03-18 NOTE — Assessment & Plan Note (Addendum)
Last LDL 49, ~1 year ago. Good medication compliance to Atorvastatin 40. No adverse effects.  ? ?F/u lipid panel from today ? ?Addendum: ?LDL 48. Continue statin.  ?

## 2021-03-18 NOTE — Assessment & Plan Note (Signed)
BP 124/59-- at goal today. Good med compliance; no adverse effects. BMP 2 weeks ago with stable CKD3. ? ?Continue Lisinopril 20 mg daily  ?

## 2021-03-18 NOTE — Assessment & Plan Note (Signed)
Improved A1c from 6.8 to 6.2 today on Janumet 5-500 daily. Good medication compliance with no adverse effects. Brought her meter today; single high of 345 with low of 104, and average of 148. She is above goal 31% and at goal at 67%, and no below or hypo range. Counseled on the benefits of daily exercise, limiting processed foods and high sugar foods, and weight loss. Denies polydipsia, polyuria, weight loss, lethargy, blurry vision, and changes in sensation. Benign physical exam and vitals wnl.   ? ?Continue Janumet  ?Advised that she can discontinue CBG's monitoring give she is at goal and not insulin dependent and not at risk for hypoglycemia; she is very happy to hear this.  ?Continue lifestyle modifications ? ?

## 2021-03-20 LAB — LIPID PANEL
Chol/HDL Ratio: 2.5 ratio (ref 0.0–4.4)
Cholesterol, Total: 108 mg/dL (ref 100–199)
HDL: 44 mg/dL (ref >39)
LDL Chol Calc (NIH): 48 mg/dL (ref 0–99)
Triglycerides: 76 mg/dL (ref 0–149)
VLDL Cholesterol Cal: 16 mg/dL (ref 5–40)

## 2021-03-23 NOTE — Addendum Note (Signed)
Addended by: Dickie La on: 03/23/2021 02:12 PM ? ? Modules accepted: Level of Service ? ?

## 2021-03-23 NOTE — Progress Notes (Signed)
Internal Medicine Clinic Attending  Case discussed with Dr. Patel  At the time of the visit.  We reviewed the resident's history and exam and pertinent patient test results.  I agree with the assessment, diagnosis, and plan of care documented in the resident's note.  

## 2021-04-08 ENCOUNTER — Other Ambulatory Visit: Payer: Self-pay | Admitting: Student

## 2021-04-08 DIAGNOSIS — E1169 Type 2 diabetes mellitus with other specified complication: Secondary | ICD-10-CM

## 2021-04-22 ENCOUNTER — Other Ambulatory Visit: Payer: Self-pay

## 2021-04-22 ENCOUNTER — Other Ambulatory Visit (HOSPITAL_COMMUNITY)
Admission: RE | Admit: 2021-04-22 | Discharge: 2021-04-22 | Disposition: A | Discharge status: Home / Self Care | Payer: Medicare Other | Source: Ambulatory Visit | Attending: Internal Medicine | Admitting: Internal Medicine

## 2021-04-22 ENCOUNTER — Ambulatory Visit (INDEPENDENT_AMBULATORY_CARE_PROVIDER_SITE_OTHER): Payer: Medicare Other | Admitting: Student

## 2021-04-22 ENCOUNTER — Encounter: Payer: Self-pay | Admitting: Student

## 2021-04-22 VITALS — BP 109/73 | HR 69 | Temp 98.2°F | Ht 68.0 in | Wt 252.4 lb

## 2021-04-22 DIAGNOSIS — N898 Other specified noninflammatory disorders of vagina: Secondary | ICD-10-CM | POA: Insufficient documentation

## 2021-04-22 DIAGNOSIS — R3121 Asymptomatic microscopic hematuria: Secondary | ICD-10-CM | POA: Diagnosis not present

## 2021-04-22 DIAGNOSIS — B3731 Acute candidiasis of vulva and vagina: Secondary | ICD-10-CM | POA: Diagnosis not present

## 2021-04-22 DIAGNOSIS — Z124 Encounter for screening for malignant neoplasm of cervix: Secondary | ICD-10-CM

## 2021-04-22 DIAGNOSIS — Z1151 Encounter for screening for human papillomavirus (HPV): Secondary | ICD-10-CM | POA: Diagnosis not present

## 2021-04-22 DIAGNOSIS — Z01419 Encounter for gynecological examination (general) (routine) without abnormal findings: Secondary | ICD-10-CM | POA: Diagnosis present

## 2021-04-22 DIAGNOSIS — R3129 Other microscopic hematuria: Secondary | ICD-10-CM

## 2021-04-22 LAB — URINALYSIS, MICROSCOPIC (REFLEX)

## 2021-04-22 LAB — URINALYSIS, ROUTINE W REFLEX MICROSCOPIC
Bilirubin Urine: NEGATIVE
Glucose, UA: NEGATIVE mg/dL
Ketones, ur: NEGATIVE mg/dL
Nitrite: NEGATIVE
Protein, ur: NEGATIVE mg/dL
Specific Gravity, Urine: 1.015 (ref 1.005–1.030)
pH: 8 (ref 5.0–8.0)

## 2021-04-22 NOTE — Assessment & Plan Note (Addendum)
Patient reports vaginal pruritus symptoms inside and outside her vagina started few days ago.  Also reports dysuria and frequency without hematuria.  Denies vaginal discharge. ? 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  She finished 6 weeks of vancomycin/cefepime and metronidazole.  She was started on a course of antibiotic by her orthopedic which she does not know the name.  She currently on another course of ciprofloxacin started on 4/3. ? ?Pap smear was performed was negative for vaginal discharge.  There was signs of vulvovaginal atrophy. ? ?-Will check for vaginal candidiasis  ?-Also obtain UA/urine culture to rule out resistant strain UTI ?-If all negative, will prescribe estrogen cream ? ?Addendum ?Vaginal swab positive for Candida vaginitis.  Will treat with fluconazole ?

## 2021-04-22 NOTE — Patient Instructions (Signed)
Ms. Rickards, ? ?It was a pleasure seeing you in the clinic today.  I am glad that you are doing well.  Your blood pressure and blood sugar are very well controlled. ? ?I will call you with the Pap smear result. ? ?Regarding your vaginal pruritus and burning with urination, we will check for yeast infection and urinary tract infection.  If they all come back negative, I will prescribe you an estrogen cream. ? ?Please return in 6 months.  Sooner if needed. ? ?Take care, ? ?Dr. Cyndie Chime ?

## 2021-04-22 NOTE — Progress Notes (Signed)
? ?  CC: Pap smear and vaginal pruritus ? ?HPI: ? ?[name] is a 66 y.o. past medical history of hypertension, type 2 diabetes, OSA, CKD 3 who presented to the clinic today for Pap smear and and evaluation for vaginal pruritus. ? ?Please see problem based charting for detailed. ? ? ?Past Medical History:  ?Diagnosis Date  ?? Arthritis   ?? Chronic normocytic anemia   ?? CKD (chronic kidney disease) stage 3, GFR 30-59 ml/min (HCC) 02/02/2013  ?? Diabetes mellitus type II, controlled ([place]) 06/17/2009  ?? Diabetic ulcer of both feet ([place]) 01/07/2010  ? Qualifier: Diagnosis of  By: Ina Homes MD, [place]    ?? Essential hypertension 06/17/2009  ?? GERD (gastroesophageal reflux disease)   ?? Glaucoma   ?? H/O hiatal hernia   ?? Hardware complicating wound infection ([place]) 12/28/2020  ?? Hyperlipidemia   ?? MRSA (methicillin resistant Staphylococcus aureus)   ?? Obstructive sleep apnea 05/26/2010  ?? Polymicrobial bacterial infection 12/28/2020  ? ?Review of Systems:  per HPI ? ?Physical Exam: ? ?Vitals:  ? 04/22/21 1314  ?BP: 109/73  ?Pulse: 69  ?Temp: 98.2 ?F (36.8 ?C)  ?TempSrc: Oral  ?SpO2: 100%  ?Weight: 252 lb 6.4 oz (114.5 kg)  ?Height: 5\' 8"  (1.727 m)  ? ?Physical Exam ?Constitutional:   ?   General: She is not in acute distress. ?   Appearance: She is not ill-appearing.  ?HENT:  ?   Head: Normocephalic.  ?Eyes:  ?   General:     ?   Right eye: No discharge.     ?   Left eye: No discharge.  ?   Conjunctiva/sclera: Conjunctivae normal.  ?Cardiovascular:  ?   Rate and Rhythm: Normal rate and regular rhythm.  ?Pulmonary:  ?   Effort: Pulmonary effort is normal. No respiratory distress.  ?   Breath sounds: Normal breath sounds.  ?Abdominal:  ?   General: There is no distension.  ?   Palpations: Abdomen is soft.  ?Genitourinary: ?   Comments: Vulvovaginal atrophy noted.  No discharge noted. ?Musculoskeletal:     ?   General: Normal range of motion.  ?Skin: ?   General: Skin is warm.  ?Neurological:  ?   General: No  focal deficit present.  ?   Mental Status: She is alert.  ?Psychiatric:     ?   Mood and Affect: Mood normal.     ?   Behavior: Behavior normal.  ?  ? ?Assessment & Plan:  ? ?See Encounters Tab for problem based charting. ? ?Patient discussed with Dr. Philipp Ovens  ?

## 2021-04-23 LAB — CERVICOVAGINAL ANCILLARY ONLY
Bacterial Vaginitis (gardnerella): NEGATIVE
Candida Glabrata: NEGATIVE
Candida Vaginitis: POSITIVE — AB
Chlamydia: NEGATIVE
Comment: NEGATIVE
Comment: NEGATIVE
Comment: NEGATIVE
Comment: NEGATIVE
Comment: NEGATIVE
Comment: NORMAL
Neisseria Gonorrhea: NEGATIVE
Trichomonas: NEGATIVE

## 2021-04-23 LAB — URINE CULTURE

## 2021-04-26 LAB — CYTOLOGY - PAP
Comment: NEGATIVE
Diagnosis: NEGATIVE
High risk HPV: NEGATIVE

## 2021-04-27 ENCOUNTER — Other Ambulatory Visit (HOSPITAL_COMMUNITY): Payer: Self-pay | Admitting: Orthopedic Surgery

## 2021-04-27 DIAGNOSIS — R3129 Other microscopic hematuria: Secondary | ICD-10-CM | POA: Insufficient documentation

## 2021-04-27 MED ORDER — FLUCONAZOLE 150 MG PO TABS: 150.0000 mg | ORAL_TABLET | Freq: Once | ORAL | 0 refills | Status: AC

## 2021-04-27 NOTE — Assessment & Plan Note (Addendum)
UA showed microscopic hematuria.  She denies vaginal bleeding.  BMP showed stable kidney function. ?We will treat her vaginal candidiasis. ?Repeat UA in 2-3 weeks.  If persistent hematuria, will place referral for urology for cystoscopy. ? ?Addendum ?Repeat UA showed no evidence of microscopic hematuria. ? ?

## 2021-04-27 NOTE — Progress Notes (Signed)
Internal Medicine Clinic Attending  Case discussed with Dr. Nguyen  At the time of the visit.  We reviewed the resident's history and exam and pertinent patient test results.  I agree with the assessment, diagnosis, and plan of care documented in the resident's note. 

## 2021-04-27 NOTE — Addendum Note (Signed)
Addended byDoran Stabler on: 04/27/2021 09:26 AM ? ? Modules accepted: Orders ? ?

## 2021-04-29 ENCOUNTER — Encounter (HOSPITAL_BASED_OUTPATIENT_CLINIC_OR_DEPARTMENT_OTHER): Payer: Self-pay | Admitting: Orthopedic Surgery

## 2021-04-29 ENCOUNTER — Other Ambulatory Visit: Payer: Self-pay

## 2021-05-03 ENCOUNTER — Encounter (HOSPITAL_BASED_OUTPATIENT_CLINIC_OR_DEPARTMENT_OTHER)
Admission: RE | Admit: 2021-05-03 | Discharge: 2021-05-03 | Disposition: A | Discharge status: Home / Self Care | Payer: Medicare Other | Source: Ambulatory Visit | Attending: Orthopedic Surgery | Admitting: Orthopedic Surgery

## 2021-05-03 DIAGNOSIS — Z01818 Encounter for other preprocedural examination: Secondary | ICD-10-CM | POA: Insufficient documentation

## 2021-05-03 DIAGNOSIS — E1169 Type 2 diabetes mellitus with other specified complication: Secondary | ICD-10-CM | POA: Insufficient documentation

## 2021-05-03 LAB — BASIC METABOLIC PANEL
Anion gap: 7 (ref 5–15)
BUN: 31 mg/dL — ABNORMAL HIGH (ref 8–23)
CO2: 26 mmol/L (ref 22–32)
Calcium: 9.1 mg/dL (ref 8.9–10.3)
Chloride: 108 mmol/L (ref 98–111)
Creatinine, Ser: 1.57 mg/dL — ABNORMAL HIGH (ref 0.44–1.00)
GFR, Estimated: 36 mL/min — ABNORMAL LOW (ref >60)
Glucose, Bld: 145 mg/dL — ABNORMAL HIGH (ref 70–99)
Potassium: 4.4 mmol/L (ref 3.5–5.1)
Sodium: 141 mmol/L (ref 135–145)

## 2021-05-03 NOTE — Progress Notes (Signed)

## 2021-05-05 ENCOUNTER — Telehealth: Payer: Self-pay

## 2021-05-05 NOTE — Telephone Encounter (Signed)
Pa  for pt ( JANUMET XR-50 )  came through on cover my meds was submitted with office notes and labs awaiting approval  or denial  ?

## 2021-05-05 NOTE — Telephone Encounter (Signed)
Received call from Barnabas Lister at [facility] wanting to confirm monthly qty of [place]. 1 tab BID = 60 tabs per 30 days. Rx was written for 180 tabs. Nothing further needed.  ?

## 2021-05-05 NOTE — Telephone Encounter (Signed)
DECISION : ? ? ? ?Approved today ? ?Request Reference Number: [id number]. ? ? JANUMET XR TAB 50-500MG  is approved through 01/16/2022. ? ? ? Your patient may now fill this prescription and it will be covered. ? ? ? ? ? ? ( COPY SENT TO PHARMACY ALSO )  ?

## 2021-05-06 ENCOUNTER — Ambulatory Visit (HOSPITAL_BASED_OUTPATIENT_CLINIC_OR_DEPARTMENT_OTHER): Payer: Medicare Other | Admitting: Anesthesiology

## 2021-05-06 ENCOUNTER — Ambulatory Visit (HOSPITAL_BASED_OUTPATIENT_CLINIC_OR_DEPARTMENT_OTHER)
Admission: RE | Admit: 2021-05-06 | Discharge: 2021-05-06 | Disposition: A | Discharge status: Home / Self Care | Payer: Medicare Other | Source: Ambulatory Visit | Attending: Orthopedic Surgery | Admitting: Orthopedic Surgery

## 2021-05-06 ENCOUNTER — Other Ambulatory Visit: Payer: Self-pay

## 2021-05-06 ENCOUNTER — Encounter (HOSPITAL_BASED_OUTPATIENT_CLINIC_OR_DEPARTMENT_OTHER): Payer: Self-pay | Admitting: Orthopedic Surgery

## 2021-05-06 ENCOUNTER — Encounter (HOSPITAL_BASED_OUTPATIENT_CLINIC_OR_DEPARTMENT_OTHER)
Admission: RE | Disposition: A | Discharge status: Home / Self Care | Payer: Self-pay | Source: Ambulatory Visit | Attending: Orthopedic Surgery

## 2021-05-06 DIAGNOSIS — G4730 Sleep apnea, unspecified: Secondary | ICD-10-CM | POA: Diagnosis not present

## 2021-05-06 DIAGNOSIS — I10 Essential (primary) hypertension: Secondary | ICD-10-CM | POA: Diagnosis not present

## 2021-05-06 DIAGNOSIS — E119 Type 2 diabetes mellitus without complications: Secondary | ICD-10-CM

## 2021-05-06 DIAGNOSIS — E1142 Type 2 diabetes mellitus with diabetic polyneuropathy: Secondary | ICD-10-CM | POA: Insufficient documentation

## 2021-05-06 DIAGNOSIS — I129 Hypertensive chronic kidney disease with stage 1 through stage 4 chronic kidney disease, or unspecified chronic kidney disease: Secondary | ICD-10-CM | POA: Diagnosis not present

## 2021-05-06 DIAGNOSIS — E1122 Type 2 diabetes mellitus with diabetic chronic kidney disease: Secondary | ICD-10-CM | POA: Diagnosis not present

## 2021-05-06 DIAGNOSIS — Z87891 Personal history of nicotine dependence: Secondary | ICD-10-CM | POA: Diagnosis not present

## 2021-05-06 DIAGNOSIS — N183 Chronic kidney disease, stage 3 unspecified: Secondary | ICD-10-CM | POA: Insufficient documentation

## 2021-05-06 DIAGNOSIS — L02419 Cutaneous abscess of limb, unspecified: Secondary | ICD-10-CM

## 2021-05-06 DIAGNOSIS — K219 Gastro-esophageal reflux disease without esophagitis: Secondary | ICD-10-CM | POA: Insufficient documentation

## 2021-05-06 DIAGNOSIS — Z7984 Long term (current) use of oral hypoglycemic drugs: Secondary | ICD-10-CM | POA: Diagnosis not present

## 2021-05-06 DIAGNOSIS — E669 Obesity, unspecified: Secondary | ICD-10-CM | POA: Diagnosis not present

## 2021-05-06 DIAGNOSIS — L02415 Cutaneous abscess of right lower limb: Secondary | ICD-10-CM | POA: Diagnosis present

## 2021-05-06 DIAGNOSIS — E11610 Type 2 diabetes mellitus with diabetic neuropathic arthropathy: Secondary | ICD-10-CM | POA: Diagnosis not present

## 2021-05-06 DIAGNOSIS — E1169 Type 2 diabetes mellitus with other specified complication: Secondary | ICD-10-CM

## 2021-05-06 HISTORY — PX: I & D EXTREMITY: SHX5045

## 2021-05-06 LAB — GLUCOSE, CAPILLARY
Glucose-Capillary: 90 mg/dL (ref 70–99)
Glucose-Capillary: 72 mg/dL (ref 70–99)

## 2021-05-06 SURGERY — IRRIGATION AND DEBRIDEMENT EXTREMITY
Anesthesia: General | Laterality: Right

## 2021-05-06 MED ORDER — VANCOMYCIN HCL 500 MG IV SOLR
INTRAVENOUS | Status: DC | PRN
  Administered 2021-05-06: 500 mg

## 2021-05-06 MED ORDER — MIDAZOLAM HCL 5 MG/5ML IJ SOLN
INTRAMUSCULAR | Status: DC | PRN
  Administered 2021-05-06: 1 mg via INTRAVENOUS

## 2021-05-06 MED ORDER — LACTATED RINGERS IV SOLN: INTRAVENOUS | Status: DC

## 2021-05-06 MED ORDER — DEXAMETHASONE SODIUM PHOSPHATE 10 MG/ML IJ SOLN
INTRAMUSCULAR | Status: DC | PRN
  Administered 2021-05-06: 5 mg via INTRAVENOUS

## 2021-05-06 MED ORDER — FENTANYL CITRATE (PF) 100 MCG/2ML IJ SOLN
INTRAMUSCULAR | Status: DC | PRN
  Administered 2021-05-06: 50 ug via INTRAVENOUS

## 2021-05-06 MED ORDER — EPHEDRINE SULFATE (PRESSORS) 50 MG/ML IJ SOLN
INTRAMUSCULAR | Status: DC | PRN
  Administered 2021-05-06: 10 mg via INTRAVENOUS

## 2021-05-06 MED ORDER — LIDOCAINE HCL (PF) 1 % IJ SOLN
INTRAMUSCULAR | Status: AC
  Filled 2021-05-06: qty 5

## 2021-05-06 MED ORDER — LIDOCAINE 2% (20 MG/ML) 5 ML SYRINGE
INTRAMUSCULAR | Status: DC | PRN
  Administered 2021-05-06: 60 mg via INTRAVENOUS

## 2021-05-06 MED ORDER — CEFAZOLIN SODIUM-DEXTROSE 2-3 GM-%(50ML) IV SOLR
INTRAVENOUS | Status: DC | PRN
  Administered 2021-05-06: 2 g via INTRAVENOUS

## 2021-05-06 MED ORDER — BUPIVACAINE-EPINEPHRINE (PF) 0.25% -1:200000 IJ SOLN
INTRAMUSCULAR | Status: AC
  Filled 2021-05-06: qty 30

## 2021-05-06 MED ORDER — FENTANYL CITRATE (PF) 100 MCG/2ML IJ SOLN
INTRAMUSCULAR | Status: AC
  Filled 2021-05-06: qty 2

## 2021-05-06 MED ORDER — ACETAMINOPHEN 500 MG PO TABS
1000.0000 mg | ORAL_TABLET | Freq: Once | ORAL | Status: AC
Start: 2021-05-06 — End: 2021-05-06
  Administered 2021-05-06: 1000 mg via ORAL

## 2021-05-06 MED ORDER — PROPOFOL 10 MG/ML IV BOLUS
INTRAVENOUS | Status: DC | PRN
Start: 2021-05-06 — End: 2021-05-06
  Administered 2021-05-06: 130 mg via INTRAVENOUS

## 2021-05-06 MED ORDER — SODIUM CHLORIDE 0.9 % IV SOLN: INTRAVENOUS | Status: DC

## 2021-05-06 MED ORDER — BUPIVACAINE-EPINEPHRINE 0.5% -1:200000 IJ SOLN
INTRAMUSCULAR | Status: DC | PRN
  Administered 2021-05-06: 8 mL

## 2021-05-06 MED ORDER — MIDAZOLAM HCL 2 MG/2ML IJ SOLN
INTRAMUSCULAR | Status: AC
  Filled 2021-05-06: qty 2

## 2021-05-06 MED ORDER — ONDANSETRON HCL 4 MG/2ML IJ SOLN: 4.0000 mg | Freq: Once | INTRAMUSCULAR | Status: DC | PRN

## 2021-05-06 MED ORDER — DOXYCYCLINE HYCLATE 50 MG PO CAPS: 100.0000 mg | ORAL_CAPSULE | Freq: Two times a day (BID) | ORAL | 0 refills | Status: AC

## 2021-05-06 MED ORDER — SODIUM CHLORIDE 0.9 % IR SOLN
Status: DC | PRN
  Administered 2021-05-06: 3000 mL

## 2021-05-06 MED ORDER — FENTANYL CITRATE (PF) 100 MCG/2ML IJ SOLN: 25.0000 ug | INTRAMUSCULAR | Status: DC | PRN

## 2021-05-06 MED ORDER — ACETAMINOPHEN 500 MG PO TABS
ORAL_TABLET | ORAL | Status: AC
Start: 2021-05-06 — End: ?
  Filled 2021-05-06: qty 2

## 2021-05-06 MED ORDER — ONDANSETRON HCL 4 MG/2ML IJ SOLN
INTRAMUSCULAR | Status: DC | PRN
  Administered 2021-05-06: 4 mg via INTRAVENOUS

## 2021-05-06 MED ORDER — CEFAZOLIN SODIUM-DEXTROSE 2-4 GM/100ML-% IV SOLN
INTRAVENOUS | Status: AC
  Filled 2021-05-06: qty 100

## 2021-05-06 SURGICAL SUPPLY — 63 items
APL PRP STRL LF DISP 70% ISPRP (MISCELLANEOUS)
BANDAGE ESMARK 6X9 LF (GAUZE/BANDAGES/DRESSINGS) IMPLANT
BLADE SURG 15 STRL LF DISP TIS (BLADE) ×2 IMPLANT
BLADE SURG 15 STRL SS (BLADE) ×4
BNDG CMPR 9X4 STRL LF SNTH (GAUZE/BANDAGES/DRESSINGS) ×1
BNDG CMPR 9X6 STRL LF SNTH (GAUZE/BANDAGES/DRESSINGS) ×1
BNDG COHESIVE 4X5 TAN ST LF (GAUZE/BANDAGES/DRESSINGS) IMPLANT
BNDG COHESIVE 6X5 TAN ST LF (GAUZE/BANDAGES/DRESSINGS) IMPLANT
BNDG ELASTIC 4X5.8 VLCR STR LF (GAUZE/BANDAGES/DRESSINGS) ×1 IMPLANT
BNDG ESMARK 4X9 LF (GAUZE/BANDAGES/DRESSINGS) ×2 IMPLANT
BNDG ESMARK 6X9 LF (GAUZE/BANDAGES/DRESSINGS) ×2
BNDG GAUZE ELAST 4 BULKY (GAUZE/BANDAGES/DRESSINGS) IMPLANT
BOOT STEPPER DURA LG (SOFTGOODS) IMPLANT
CHLORAPREP W/TINT 26 (MISCELLANEOUS) IMPLANT
COVER BACK TABLE 60X90IN (DRAPES) ×2 IMPLANT
CUFF TOURN SGL QUICK 24 (TOURNIQUET CUFF)
CUFF TOURN SGL QUICK 34 (TOURNIQUET CUFF)
CUFF TRNQT CYL 24X4X16.5-23 (TOURNIQUET CUFF) IMPLANT
CUFF TRNQT CYL 34X4.125X (TOURNIQUET CUFF) IMPLANT
DRAPE EXTREMITY T 121X128X90 (DISPOSABLE) ×2 IMPLANT
DRAPE INCISE IOBAN 66X45 STRL (DRAPES) IMPLANT
DRAPE SURG 17X23 STRL (DRAPES) ×2 IMPLANT
DRAPE U-SHAPE 47X51 STRL (DRAPES) ×2 IMPLANT
DRSG MEPITEL 4X7.2 (GAUZE/BANDAGES/DRESSINGS) ×1 IMPLANT
DRSG PAD ABDOMINAL 8X10 ST (GAUZE/BANDAGES/DRESSINGS) ×1 IMPLANT
GAUZE SPONGE 4X4 12PLY STRL (GAUZE/BANDAGES/DRESSINGS) ×1 IMPLANT
GLOVE BIO SURGEON STRL SZ8 (GLOVE) ×2 IMPLANT
GLOVE BIOGEL PI IND STRL 8 (GLOVE) ×2 IMPLANT
GLOVE BIOGEL PI INDICATOR 8 (GLOVE) ×2
GLOVE ECLIPSE 8.0 STRL XLNG CF (GLOVE) ×2 IMPLANT
GOWN STRL REUS W/ TWL LRG LVL3 (GOWN DISPOSABLE) ×1 IMPLANT
GOWN STRL REUS W/ TWL XL LVL3 (GOWN DISPOSABLE) ×2 IMPLANT
GOWN STRL REUS W/TWL LRG LVL3 (GOWN DISPOSABLE) ×2
GOWN STRL REUS W/TWL XL LVL3 (GOWN DISPOSABLE) ×4
MANIFOLD NEPTUNE II (INSTRUMENTS) ×2 IMPLANT
NEEDLE HYPO 22GX1.5 SAFETY (NEEDLE) ×1 IMPLANT
PACK BASIN DAY SURGERY FS (CUSTOM PROCEDURE TRAY) ×2 IMPLANT
PAD CAST 4YDX4 CTTN HI CHSV (CAST SUPPLIES) IMPLANT
PADDING CAST COTTON 4X4 STRL (CAST SUPPLIES) ×2
PADDING CAST COTTON 6X4 STRL (CAST SUPPLIES) IMPLANT
PENCIL SMOKE EVACUATOR (MISCELLANEOUS) ×2 IMPLANT
SANITIZER HAND PURELL 535ML FO (MISCELLANEOUS) ×1 IMPLANT
SET IRRIG Y TYPE TUR BLADDER L (SET/KITS/TRAYS/PACK) ×2 IMPLANT
SHEET MEDIUM DRAPE 40X70 STRL (DRAPES) ×2 IMPLANT
SPLINT FAST PLASTER 5X30 (CAST SUPPLIES)
SPLINT PLASTER CAST FAST 5X30 (CAST SUPPLIES) IMPLANT
SPONGE T-LAP 18X18 ~~LOC~~+RFID (SPONGE) ×2 IMPLANT
STOCKINETTE 6  STRL (DRAPES) ×2
STOCKINETTE 6 STRL (DRAPES) ×1 IMPLANT
SUT ETHILON 2 0 FS 18 (SUTURE) ×1 IMPLANT
SUT ETHILON 3 0 PS 1 (SUTURE) IMPLANT
SUT MNCRL AB 3-0 PS2 18 (SUTURE) IMPLANT
SUT VIC AB 2-0 SH 27 (SUTURE)
SUT VIC AB 2-0 SH 27XBRD (SUTURE) IMPLANT
SWAB COLLECTION DEVICE MRSA (MISCELLANEOUS) ×1 IMPLANT
SWAB CULTURE ESWAB REG 1ML (MISCELLANEOUS) ×1 IMPLANT
SYR BULB EAR ULCER 3OZ GRN STR (SYRINGE) IMPLANT
SYR CONTROL 10ML LL (SYRINGE) ×1 IMPLANT
TOWEL GREEN STERILE FF (TOWEL DISPOSABLE) ×2 IMPLANT
TRAY DSU PREP LF (CUSTOM PROCEDURE TRAY) ×2 IMPLANT
TUBE CONNECTING 20X1/4 (TUBING) ×2 IMPLANT
UNDERPAD 30X36 HEAVY ABSORB (UNDERPADS AND DIAPERS) ×2 IMPLANT
YANKAUER SUCT BULB TIP NO VENT (SUCTIONS) ×2 IMPLANT

## 2021-05-06 NOTE — Discharge Instructions (Addendum)
Sarah Hewitt, MD [facility]  Please read the following information regarding your care after surgery.  Medications  You only need a prescription for the narcotic pain medicine (ex. oxycodone, Percocet, Norco).  All of the other medicines listed below are available over the counter. ? Aleve 2 pills twice a day for the first 3 days after surgery. X acetominophen (Tylenol) 650 mg every 4-6 hours as you need for minor to moderate pain ? oxycodone as prescribed for severe pain  Narcotic pain medicine (ex. oxycodone, Percocet, Vicodin) will cause constipation.  To prevent this problem, take the following medicines while you are taking any pain medicine. ? docusate sodium (Colace) 100 mg twice a day ? senna (Senokot) 2 tablets twice a day  ? To help prevent blood clots, take a baby aspirin (81 mg) twice a day for two weeks after surgery.  You should also get up every hour while you are awake to move around.    Weight Bearing ? Bear weight when you are able on your operated leg or foot. X Bear weight only on your operated foot in the post-op shoe. ? Do not bear any weight on the operated leg or foot.  Cast / Splint / Dressing X Keep your splint, cast or dressing clean and dry.  Don't put anything (coat hanger, pencil, etc) down inside of it.  If it gets damp, use a hair dryer on the cool setting to dry it.  If it gets soaked, call the office to schedule an appointment for a cast change. ? Remove your dressing 3 days after surgery and cover the incisions with dry dressings.    After your dressing, cast or splint is removed; you may shower, but do not soak or scrub the wound.  Allow the water to run over it, and then gently pat it dry.  Swelling It is normal for you to have swelling where you had surgery.  To reduce swelling and pain, keep your toes above your nose for at least 3 days after surgery.  It may be necessary to keep your foot or leg elevated for several weeks.  If it hurts, it should be  elevated.  Follow Up Call my office at [phone] when you are discharged from the hospital or surgery center to schedule an appointment to be seen two weeks after surgery.  Call my office at [phone] if you develop a fever >101.5 F, nausea, vomiting, bleeding from the surgical site or severe pain.     Post Anesthesia Home Care Instructions  Activity: Get plenty of rest for the remainder of the day. A responsible individual must stay with you for 24 hours following the procedure.  For the next 24 hours, DO NOT: -Drive a car -Operate machinery -Drink alcoholic beverages -Take any medication unless instructed by your physician -Make any legal decisions or sign important papers.  Meals: Start with liquid foods such as gelatin or soup. Progress to regular foods as tolerated. Avoid greasy, spicy, heavy foods. If nausea and/or vomiting occur, drink only clear liquids until the nausea and/or vomiting subsides. Call your physician if vomiting continues.  Special Instructions/Symptoms: Your throat may feel dry or sore from the anesthesia or the breathing tube placed in your throat during surgery. If this causes discomfort, gargle with warm salt water. The discomfort should disappear within 24 hours.  If you had a scopolamine patch placed behind your ear for the management of post- operative nausea and/or vomiting:  1. The medication in the patch is   effective for 72 hours, after which it should be removed.  Wrap patch in a tissue and discard in the trash. Wash hands thoroughly with soap and water. 2. You may remove the patch earlier than 72 hours if you experience unpleasant side effects which may include dry mouth, dizziness or visual disturbances. 3. Avoid touching the patch. Wash your hands with soap and water after contact with the patch.     

## 2021-05-06 NOTE — Op Note (Addendum)
05/06/2021 ? ?3:40 PM ? ?PATIENT:  [name]  66 y.o. female ? ?PRE-OPERATIVE DIAGNOSIS: Deep abscess right medial ankle ? ?POST-OPERATIVE DIAGNOSIS: Same ? ?Procedure(s):  Irrigation and excisional debridement of right ankle abcess ? ?SURGEON:  Toni Arthurs, MD ? ?ASSISTANT: None ? ?ANESTHESIA:   General ? ?EBL:  minimal  ? ?TOURNIQUET: Approximately 15 minutes with an ankle Esmarch tourniquet ? ?COMPLICATIONS:  None apparent ? ?DISPOSITION:  Extubated, awake and stable to recovery. ? ?INDICATION FOR PROCEDURE: The patient is a 66 year old female with a past medical history significant for diabetes complicated by peripheral neuropathy and a Charcot right ankle and hindfoot deformity.  She has signs and symptoms of a deep abscess at the posteromedial ankle confirmed by CT scan.  She presents now for irrigation and debridement of this abscess.The risks and benefits of the alternative treatment options have been discussed in detail.  The patient wishes to proceed with surgery and specifically understands risks of bleeding, infection, nerve damage, blood clots, need for additional surgery, amputation and death.  ? ?PROCEDURE IN DETAIL: After preoperative consent was obtained and the correct operative site was identified, the patient was brought the operating room and placed supine on the operating table.  General anesthesia was induced.  Preoperative antibiotics were held pending cultures.  Surgical timeout was taken.  The right lower extremity was prepped and draped in standard sterile fashion.  The foot was exsanguinated and a 4 inch Esmarch tourniquet wrapped around the ankle.  The wound was identified at the posteromedial ankle.  An incision was made over the abscess and dissection carried down through the subcutaneous tissues.  The abscess was entered.  Cultures were obtained with swab sticks for aerobic and anaerobic specimens.  Perioperative antibiotics were then administered.  The abscess measured 4 cm x 3  cm and was about a centimeter deep.  It was debrided with a curette of white viscous material.  The wound was then irrigated copiously with 3 L of normal saline.  Vancomycin powder was packed in the wound.  The skin edges were loosely approximated with 2-0 nylon.  Quarter percent Marcaine with epinephrine was infiltrated into the subcutaneous tissues around the surgical site.  Sterile dressings were applied followed by a compression wrap.  The tourniquet was released after application of the dressings.  The patient was awakened from anesthesia and transported to the recovery room in stable condition.. ? ?FOLLOW UP PLAN: Weightbearing as tolerated on the right foot in a flat postop shoe.  Follow-up in the office in 2 weeks for a wound check and possible suture removal.  Discharged home on doxycycline pending cultures.  No indication for DVT prophylaxis in this ambulatory patient. ? ?Debridement type: Excisional Debridement ? ?Side: right ? ?Body Location: ankle  ? ?Tools used for debridement: curette ? ?Pre-debridement Wound size (cm):   Length: 3        Width: 4     Depth: 1  ? ?Post-debridement Wound size (cm):   Length: 3        Width: 4     Depth: 1  ? ?Debridement depth beyond dead/damaged tissue down to healthy viable tissue: yes ? ?Tissue layer involved: skin, subcutaneous tissue, muscle / fascia ? ?Ashby Dawes of tissue removed: [place] and [employer] ? ?Irrigation volume: 3L    ? ?Irrigation fluid type: Normal Saline ?  ?

## 2021-05-06 NOTE — Transfer of Care (Signed)
Immediate Anesthesia Transfer of Care Note ? ?Patient: Sarah JaegerMeredith J [name] ? ?Procedure(s) Performed: Irrigation and excisional debridement of right ankle abcess (Right) ? ?Patient Location: PACU ? ?Anesthesia Type:General ? ?Level of Consciousness: awake, alert , oriented, drowsy and patient cooperative ? ?Airway & Oxygen Therapy: Patient Spontanous Breathing and Patient connected to face mask oxygen ? ?Post-op Assessment: Report given to RN and Post -op Vital signs reviewed and stable ? ?Post vital signs: Reviewed and stable ? ?Last Vitals:  ?Vitals Value Taken Time  ?BP    ?Temp    ?Pulse    ?Resp    ?SpO2    ? ? ?Last Pain:  ?Vitals:  ? 05/06/21 1148  ?TempSrc: Oral  ?PainSc: 0-No pain  ?   ? ?  ? ?Complications: No notable events documented. ?

## 2021-05-06 NOTE — Anesthesia Procedure Notes (Signed)
Procedure Name: LMA Insertion ?Date/Time: 05/06/2021 3:08 PM ?Performed by: Maryella Shivers, CRNA ?Pre-anesthesia Checklist: Patient identified, Emergency Drugs available, Suction available and Patient being monitored ?Patient Re-evaluated:Patient Re-evaluated prior to induction ?Oxygen Delivery Method: Circle system utilized ?Preoxygenation: Pre-oxygenation with 100% oxygen ?Induction Type: IV induction ?Ventilation: Mask ventilation without difficulty ?LMA: LMA inserted ?LMA Size: 4.0 ?Number of attempts: 1 ?Airway Equipment and Method: Bite block ?Placement Confirmation: positive ETCO2 ?Tube secured with: Tape ?Dental Injury: Teeth and Oropharynx as per pre-operative assessment  ? ? ? ? ?

## 2021-05-06 NOTE — H&P (Signed)
[name] is an 66 y.o. female.   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  She presented to the office with some drainage from her medial ankle a couple of weeks ago.  A CT scan reveals an abscess at her posteromedial ankle.  She presents today for irrigation and debridement of this wound. ? ?Past Medical History:  ?Diagnosis Date  ? Arthritis   ? Chronic normocytic anemia   ? CKD (chronic kidney disease) stage 3, GFR 30-59 ml/min ([facility]) 02/02/2013  ? Diabetes mellitus type II, controlled ([place]) 06/17/2009  ? Diabetic ulcer of both feet ([place]) 01/07/2010  ? Qualifier: Diagnosis of  By: Ina Homes MD, [place]    ? Essential hypertension 06/17/2009  ? GERD (gastroesophageal reflux disease)   ? Glaucoma   ? H/O hiatal hernia   ? Hardware complicating wound infection ([place]) 12/28/2020  ? Hyperlipidemia   ? MRSA (methicillin resistant Staphylococcus aureus)   ? Obstructive sleep apnea 05/26/2010  ? Polymicrobial bacterial infection 12/28/2020  ? ? ?Past Surgical History:  ?Procedure Laterality Date  ? ACHILLES TENDON LENGTHENING Left 11/06/2014  ? Procedure: LEFT ACHILLES TENDON LENGTHENING, ;  Surgeon: [name] Simmer, MD;  Location: [facility];  Service: Orthopedics;  Laterality: Left;  ? APPENDECTOMY  1975  ? FOOT ARTHRODESIS Left 11/06/2014  ? Procedure: TALONAVICULAR CANCELLOUS CUBOID ARTHRODESIS WITH CORRECTIVE OSTEOTOMY ;  Surgeon: [name] Simmer, MD;  Location: [place] [facility];  Service: Orthopedics;  Laterality: Left;  ? FOOT ARTHRODESIS Right 05/14/2020  ? Procedure: Right heelcord lengthening, subtalar and talonavicular arthrodesis, talus exostectomy, talus corrective osteotomy;  Surgeon: [name] Simmer, MD;  Location: [facility];  Service: Orthopedics;  Laterality: Right;  ? HARDWARE REMOVAL Left 02/19/2015  ? Procedure: REMOVAL OF LEFT FOOT EXTERNAL FIXATOR, CASTING OF LEFT ANKLE ;  Surgeon: [name] Simmer, MD;  Location: [place];  Service:  Orthopedics;  Laterality: Left;  ? HARDWARE REMOVAL Right 12/17/2020  ? Procedure: Removal of deep implant right foot;  Surgeon: [name] Simmer, MD;  Location: [facility];  Service: Orthopedics;  Laterality: Right;  ? I & D EXTREMITY Left 2011  ? "ulcer on my foot got infected" (01/30/2013)  ? MULTIPLE TOOTH EXTRACTIONS Bilateral   ? OOPHORECTOMY Bilateral 1975-1976  ? ? ?Family History  ?Problem Relation Age of Onset  ? Diabetes Mother   ? Hypertension Mother   ? Dementia Mother   ? Heart disease Father   ? Stroke Father   ? Diabetes Father   ? Hypertension Father   ? Gout Father   ? Deep vein thrombosis Father   ? Breast cancer Cousin   ? Breast cancer Cousin   ? ?Social History:  reports that she quit smoking about 48 years ago. Her smoking use included cigarettes. She smoked an average of 0.50 packs per day. She has never used smokeless tobacco. She reports that she does not drink alcohol and does not use drugs. ? ?Allergies:  ?Allergies  ?Allergen Reactions  ? Codeine Itching and Swelling  ?  swelling in throat  ? Pravastatin Other (See Comments)  ?  cramps  ? Latex Rash  ? ? ?Medications Prior to Admission  ?Medication Sig Dispense Refill  ? Accu-Chek Softclix Lancets lancets Use to check blood glucose up to three times a day. Dx code E 11.9 100 each 12  ? acetaminophen (TYLENOL) 500 MG tablet Take 500-1,000 mg by mouth every 6 (six) hours as needed for  moderate pain or headache.    ? Ascorbic Acid (VITAMIN C) 1000 MG tablet Take 1,000 mg by mouth daily. Rose hips    ? aspirin EC 81 MG tablet Take 81 mg by mouth daily. Swallow whole.    ? atorvastatin (LIPITOR) 40 MG tablet Take 1 tablet (40 mg total) by mouth daily. 90 tablet 3  ? Biotin 5000 MCG CAPS Take 5,000 mcg by mouth daily.    ? Blood Glucose Monitoring Suppl (ACCU-CHEK AVIVA PLUS) w/Device KIT Use to monitor blood sugars 1 kit 0  ? Blood Pressure Monitoring (ADULT BLOOD PRESSURE CUFF LG) KIT Use to monitor your BP daily and bring log during each visit 1 kit 0   ? brimonidine-timolol (COMBIGAN) 0.2-0.5 % ophthalmic solution Place 1 drop into both eyes every 12 (twelve) hours. Place one drop to both eyes twice daily for glaucoma (Patient taking differently: Place 1 drop into both eyes 2 (two) times daily. 8 am and 1700) 15 mL 0  ? calcium carbonate (TUMS - DOSED IN MG ELEMENTAL CALCIUM) 500 MG chewable tablet Chew 500-1,000 mg by mouth daily as needed for indigestion or heartburn.    ? Cholecalciferol (VITAMIN D) 50 MCG (2000 UT) tablet Take 2,000 Units by mouth daily.    ? COLLAGEN PO Take 100 mg by mouth daily.    ? cycloSPORINE (RESTASIS) 0.05 % ophthalmic emulsion Place 1 drop into both eyes 2 (two) times daily. 8 am and 1700    ? Emollient (BAG BALM EX) Apply 1 application topically daily.    ? ferrous sulfate 325 (65 FE) MG tablet Take 1 tablet (325 mg total) by mouth daily with breakfast. 90 tablet 1  ? glucose blood (ACCU-CHEK AVIVA) test strip Use to check blood glucose up to three times a day. Dx code E 11.9 100 each 12  ? [name] TAKE ONE TABLET BY MOUTH TWICE A DAY 180 tablet 0  ? lisinopril (ZESTRIL) 20 MG tablet Take 1 tablet (20 mg total) by mouth daily. 90 tablet 3  ? Multiple Vitamin (MULTIVITAMIN) tablet Take 1 tablet by mouth daily. 50 plus    ? Netarsudil-Latanoprost (ROCKLATAN) 0.02-0.005 % SOLN Place 1 drop into both eyes daily at 10 pm.    ? pantoprazole (PROTONIX) 20 MG tablet Take 1 tablet (20 mg total) by mouth 2 (two) times daily. 180 tablet 2  ? Polyvinyl Alcohol-Povidone (REFRESH OP) Place 1 drop into both eyes daily as needed (dry eyes).    ? Vitamin A 2400 MCG (8000 UT) TABS Take 8,000 Units by mouth daily.    ? vitamin B-12 (CYANOCOBALAMIN) 500 MCG tablet Take 500 mcg by mouth daily.    ? vitamin E 180 MG (400 UNITS) capsule Take 400 Units by mouth daily.    ? Zinc 50 MG CAPS Take 50 mg by mouth daily.     ? docusate sodium (COLACE) 100 MG capsule Take 1 capsule (100 mg total) by mouth 2 (two) times daily. While taking  narcotic pain medicine. 30 capsule 0  ? ? ?Results for orders placed or performed during the hospital encounter of 05/06/21 (from the past 48 hour(s))  ?Glucose, capillary     Status: None  ? Collection Time: 05/06/21 11:48 AM  ?Result Value Ref Range  ? Glucose-Capillary 90 70 - 99 mg/dL  ?  Comment: Glucose reference range applies only to samples taken after fasting for at least 8 hours.  ? ?No results found. ? ?Review of Systems no recent  fever, chills, nausea, vomiting or changes in her appetite ? ?Blood pressure 136/82, pulse 62, temperature 97.8 ?F (36.6 ?C), temperature source Oral, resp. rate 16, height 5' 8"  (1.727 m), weight 114.6 kg, SpO2 100 %. ?Physical Exam  ?Well-nourished well-developed woman in no apparent distress.  Alert and oriented.  Normal mood and affect.  Gait is heel-to-toe reciprocal bilaterally with some antalgia to the right.  The right ankle has a posteromedial wound with some fluctuance.  CT scan reveals an abscess in the deep subcutaneous tissues in this area. ? ? ?Assessment/Plan ?Right ankle abscess -to the operating room today for irrigation and excisional debridement.  The risks and benefits of the alternative treatment options have been discussed in detail.  The patient wishes to proceed with surgery and specifically understands risks of bleeding, infection, nerve damage, blood clots, need for additional surgery, amputation and death.  ? ?[name] Simmer, MD ?05-08-21, 2:33 PM ? ? ? ?

## 2021-05-06 NOTE — Anesthesia Postprocedure Evaluation (Signed)
Anesthesia Post Note ? ?Patient: [name] ? ?Procedure(s) Performed: Irrigation and excisional debridement of right ankle abcess (Right) ? ?  ? ?Patient location during evaluation: PACU ?Anesthesia Type: General ?Level of consciousness: awake and alert ?Pain management: pain level controlled ?Vital Signs Assessment: post-procedure vital signs reviewed and stable ?Respiratory status: spontaneous breathing, nonlabored ventilation and respiratory function stable ?Cardiovascular status: blood pressure returned to baseline and stable ?Postop Assessment: no apparent nausea or vomiting ?Anesthetic complications: no ? ? ?No notable events documented. ? ?Last Vitals:  ?Vitals:  ? 05/06/21 1148 05/06/21 1545  ?BP: 136/82 (!) 155/69  ?Pulse: 62 69  ?Resp: 16 18  ?Temp: 36.6 ?C 36.4 ?C  ?SpO2: 100% 100%  ?  ?Last Pain:  ?Vitals:  ? 05/06/21 1545  ?TempSrc:   ?PainSc: 0-No pain  ? ? ?  ?  ?  ?  ?  ?  ? ?Gaye Scorza,W. EDMOND ? ? ? ? ?

## 2021-05-06 NOTE — Anesthesia Preprocedure Evaluation (Addendum)
Anesthesia Evaluation  ?Patient identified by MRN, date of birth, ID band ?Patient awake ? ? ? ?Reviewed: ?Allergy & Precautions, NPO status , Patient's Chart, lab work & pertinent test results ? ?Airway ?Mallampati: II ? ?TM Distance: >3 FB ?Neck ROM: Full ? ? ? Dental ? ?(+) Dental Advisory Given, Edentulous Upper, Partial Lower ?  ?Pulmonary ?sleep apnea and Continuous Positive Airway Pressure Ventilation , former smoker,  ?  ?Pulmonary exam normal ?breath sounds clear to auscultation ? ? ? ? ? ? Cardiovascular ?hypertension, Pt. on medications ?Normal cardiovascular exam ?Rhythm:Regular Rate:Normal ? ? ?  ?Neuro/Psych ? Neuromuscular disease   ? GI/Hepatic ?Neg liver ROS, hiatal hernia, GERD  Medicated,  ?Endo/Other  ?diabetes, Type 2, Oral Hypoglycemic Agentsobesity ? Renal/GU ?Renal InsufficiencyRenal disease  ? ?  ?Musculoskeletal ? ?(+) Arthritis ,  ? Abdominal ?  ?Peds ? Hematology ?negative hematology ROS ?(+)   ?Anesthesia Other Findings ?Day of surgery medications reviewed with the patient. ? Reproductive/Obstetrics ? ?  ? ? ? ? ? ? ? ? ? ? ? ? ? ?  ?  ? ? ? ? ? ? ? ?Anesthesia Physical ?Anesthesia Plan ? ?ASA: 3 ? ?Anesthesia Plan: General  ? ?Post-op Pain Management: Tylenol PO (pre-op)*  ? ?Induction: Intravenous ? ?PONV Risk Score and Plan: 3 and Midazolam, Dexamethasone and Ondansetron ? ?Airway Management Planned: LMA ? ?Additional Equipment:  ? ?Intra-op Plan:  ? ?Post-operative Plan: Extubation in OR ? ?Informed Consent: I have reviewed the patients History and Physical, chart, labs and discussed the procedure including the risks, benefits and alternatives for the proposed anesthesia with the patient or authorized representative who has indicated his/her understanding and acceptance.  ? ? ? ?Dental advisory given ? ?Plan Discussed with: CRNA ? ?Anesthesia Plan Comments:   ? ? ? ? ? ? ?Anesthesia Quick Evaluation ? ?

## 2021-05-10 ENCOUNTER — Encounter (HOSPITAL_BASED_OUTPATIENT_CLINIC_OR_DEPARTMENT_OTHER): Payer: Self-pay | Admitting: Orthopedic Surgery

## 2021-05-11 ENCOUNTER — Other Ambulatory Visit: Payer: Medicare Other

## 2021-05-11 DIAGNOSIS — R3121 Asymptomatic microscopic hematuria: Secondary | ICD-10-CM

## 2021-05-11 LAB — AEROBIC/ANAEROBIC CULTURE W GRAM STAIN (SURGICAL/DEEP WOUND): Gram Stain: NONE SEEN

## 2021-05-12 LAB — URINALYSIS, ROUTINE W REFLEX MICROSCOPIC
Bilirubin, UA: NEGATIVE
Glucose, UA: NEGATIVE
Ketones, UA: NEGATIVE
Nitrite, UA: NEGATIVE
Protein,UA: NEGATIVE
RBC, UA: NEGATIVE
Specific Gravity, UA: 1.008 (ref 1.005–1.030)
Urobilinogen, Ur: 0.2 mg/dL (ref 0.2–1.0)
pH, UA: 7 (ref 5.0–7.5)

## 2021-05-12 LAB — MICROSCOPIC EXAMINATION
Bacteria, UA: NONE SEEN
Casts: NONE SEEN /lpf
RBC, Urine: NONE SEEN /hpf (ref 0–2)
WBC, UA: NONE SEEN /hpf (ref 0–5)

## 2021-06-01 ENCOUNTER — Other Ambulatory Visit: Payer: Medicare Other

## 2021-06-30 ENCOUNTER — Telehealth: Payer: Self-pay

## 2021-06-30 NOTE — Telephone Encounter (Signed)
Pa for pt ( PANTOPRAZOLE ) came through on cover my meds  ... Was submitted with  last office notes  ... Awaiting approval or denial      UPDATE :    [employer] is reviewing your PA request. Typically an electronic response will be received within 24-72 hours

## 2021-06-30 NOTE — Telephone Encounter (Signed)
DECISION :      Outcome   N/Atoday   This medication or product is on your plan's list of covered drugs. Prior authorization is not required at this time. If your pharmacy has questions regarding the processing of your prescription, please have them call the [facility] pharmacy help desk at [phone]      ( COPY SENT TO PHARMACY ALSO )

## 2021-07-10 ENCOUNTER — Encounter: Payer: Self-pay | Admitting: *Deleted

## 2021-07-29 ENCOUNTER — Other Ambulatory Visit: Payer: Self-pay

## 2021-07-29 DIAGNOSIS — E1169 Type 2 diabetes mellitus with other specified complication: Secondary | ICD-10-CM

## 2021-07-29 MED ORDER — JANUMET XR 50-500 MG PO TB24: 1.0000 | ORAL_TABLET | Freq: Two times a day (BID) | ORAL | 0 refills | Status: DC

## 2021-10-15 ENCOUNTER — Other Ambulatory Visit: Payer: Self-pay | Admitting: Student

## 2021-10-15 ENCOUNTER — Other Ambulatory Visit: Payer: Self-pay | Admitting: Internal Medicine

## 2021-10-15 DIAGNOSIS — K219 Gastro-esophageal reflux disease without esophagitis: Secondary | ICD-10-CM

## 2021-10-15 DIAGNOSIS — E1169 Type 2 diabetes mellitus with other specified complication: Secondary | ICD-10-CM

## 2021-10-15 MED ORDER — JANUMET XR 50-500 MG PO TB24: 1.0000 | ORAL_TABLET | Freq: Two times a day (BID) | ORAL | 0 refills | Status: DC

## 2021-10-15 MED ORDER — PANTOPRAZOLE SODIUM 20 MG PO TBEC: 20.0000 mg | DELAYED_RELEASE_TABLET | Freq: Two times a day (BID) | ORAL | 2 refills | Status: DC

## 2021-10-15 NOTE — Telephone Encounter (Signed)
Patient does have appointment with me in October 2023. I am okay with renewal but with kidney function and good A1c I might consider changing this in a few months

## 2021-11-08 ENCOUNTER — Ambulatory Visit (INDEPENDENT_AMBULATORY_CARE_PROVIDER_SITE_OTHER): Payer: Medicare Other | Admitting: Student

## 2021-11-08 ENCOUNTER — Other Ambulatory Visit: Payer: Self-pay

## 2021-11-08 ENCOUNTER — Encounter: Payer: Self-pay | Admitting: Student

## 2021-11-08 ENCOUNTER — Ambulatory Visit (INDEPENDENT_AMBULATORY_CARE_PROVIDER_SITE_OTHER): Payer: Medicare Other

## 2021-11-08 VITALS — Ht 68.0 in | Wt 266.5 lb

## 2021-11-08 VITALS — BP 129/65 | HR 64 | Temp 98.0°F | Ht 68.0 in | Wt 266.5 lb

## 2021-11-08 DIAGNOSIS — Z23 Encounter for immunization: Secondary | ICD-10-CM

## 2021-11-08 DIAGNOSIS — E1169 Type 2 diabetes mellitus with other specified complication: Secondary | ICD-10-CM | POA: Diagnosis not present

## 2021-11-08 DIAGNOSIS — M8618 Other acute osteomyelitis, other site: Secondary | ICD-10-CM

## 2021-11-08 DIAGNOSIS — Z0000 Encounter for general adult medical examination without abnormal findings: Secondary | ICD-10-CM

## 2021-11-08 DIAGNOSIS — K219 Gastro-esophageal reflux disease without esophagitis: Secondary | ICD-10-CM

## 2021-11-08 DIAGNOSIS — G4733 Obstructive sleep apnea (adult) (pediatric): Secondary | ICD-10-CM

## 2021-11-08 DIAGNOSIS — R5383 Other fatigue: Secondary | ICD-10-CM

## 2021-11-08 DIAGNOSIS — E2839 Other primary ovarian failure: Secondary | ICD-10-CM

## 2021-11-08 DIAGNOSIS — G479 Sleep disorder, unspecified: Secondary | ICD-10-CM | POA: Insufficient documentation

## 2021-11-08 DIAGNOSIS — I10 Essential (primary) hypertension: Secondary | ICD-10-CM

## 2021-11-08 DIAGNOSIS — Z87891 Personal history of nicotine dependence: Secondary | ICD-10-CM

## 2021-11-08 LAB — POCT GLYCOSYLATED HEMOGLOBIN (HGB A1C): Hemoglobin A1C: 6.8 % — AB (ref 4.0–5.6)

## 2021-11-08 LAB — GLUCOSE, CAPILLARY: Glucose-Capillary: 143 mg/dL — ABNORMAL HIGH (ref 70–99)

## 2021-11-08 MED ORDER — LISINOPRIL 20 MG PO TABS: 20.0000 mg | ORAL_TABLET | Freq: Every day | ORAL | 3 refills | Status: DC

## 2021-11-08 MED ORDER — JANUMET XR 50-500 MG PO TB24: 1.0000 | ORAL_TABLET | Freq: Two times a day (BID) | ORAL | 0 refills | Status: DC

## 2021-11-08 NOTE — Assessment & Plan Note (Addendum)
Patient reports few week history of waking up at 2 AM.  She reports that this is something new.  She states that she is often very tired during the day.  She states that when she sits down, she will just fall asleep.  She states that she would just start nodding off while sitting down.  She denies doing this during driving.  She does report falling asleep during church.  She attributes this to not sleeping well.  She does endorse that she does drink caffeine before bed, and this could be playing a role.  She also reports having more stress in her life recently.  Anemia and hypothyroidism was taken under consideration, but blood counts look assuring, and no signs of bleeding.  Given history, hypothyroidism less likely, could evaluate later if it does not resolve.  Plan: -Continue good sleep hygiene -Handouts provided for good sleep hygiene -Avoid caffeine during bedtime

## 2021-11-08 NOTE — Assessment & Plan Note (Addendum)
Patient's most recent surgery was 6 months ago.  Patient is doing well.  Patient does request a motorized scooter as she recovers.  Patient is almost out of boot.  Patient is to follow-up with orthopedics in November 2023.  Patient is in a boot, and patient has trouble mobilizing at times with pain.  Patient is recovering well, but still has mobility issues.  Until patient is fully healed, I think it is appropriate to get her a mobility scooter.  Plan: -Following Ortho recommendations -Start paperwork for motorized scooter

## 2021-11-08 NOTE — Assessment & Plan Note (Signed)
Patient's blood pressure at 129/65 today.  This is at goal.  Patient has good medication compliance.  Patient is managed with lisinopril 20 mg daily.  Plan: -Continue lisinopril 20 mg daily

## 2021-11-08 NOTE — Patient Instructions (Addendum)
[name],Thank you for allowing me to take part in your care today.  Here are your instructions.  1.  Regarding your sleep, please avoid caffeine before bed.  Continue good sleep hygiene.  We will refrain from doing a thyroid work-up at this time.  Continue to wear your BiPAP at night.  2.  Regarding diabetes, your A1c today was 6.8.  This is at goal of less than 7.  Continue your Janumet as prescribed.  I would not change any medications today.  Please follow-up in 3 months.  3.  Regarding your hypertension, your blood pressure today was 129/65.  Please continue to check your blood pressure at home.  Follow-up in 3 months for blood pressure check  Thank you, Dr. Posey Pronto  If you have any other questions please contact the internal medicine clinic at [phone]

## 2021-11-08 NOTE — Assessment & Plan Note (Signed)
Patient reports good compliance with her CPAP.  No concerns.  She wears about 7 hours every night.  She does note that she wakes up at 2 AM for the past 2 weeks.  She denies waking up gasping for air.  She states that her eyes is open.  Plan: -Continue CPAP

## 2021-11-08 NOTE — Assessment & Plan Note (Addendum)
Patient presents for diabetes follow-up.  Patient is A1C today 6.8.  This is at goal.  Goal is less than 7.  Patient reports that she is compliant with her medications.  She states that she is doing well.  She is watching what she eats.  She states that she has become more active since her surgery and states that she wants to keep it that way.  She notes that she is trying to watch what she eats.  On exam, she has good sensation to her bilateral lower extremities.  Well-healing scar on medial surface of right foot.  Patient does have a meter, but only checks it occasionally.  Over the past month she is only checked it 6 times.  Plan: -Foot exam today normal -Albumin creatinine ratio today -Continue Janumet 50-500 twice daily -Return in 3 months for follow-up  Addendum: -Urine microalbumin creatinine ratio within normal limits.  We will continue to follow.

## 2021-11-08 NOTE — Patient Instructions (Signed)

## 2021-11-08 NOTE — Progress Notes (Signed)
Subjective:   [name] is a 66 y.o. female who presents for Medicare Annual (Subsequent) preventive examination.  I connected with  [name] on 11/08/21 by a  IN PERSON [facility]     Patient Location: Other:  IN [facility]   Provider Location: Office/Clinic  I discussed the limitations of evaluation and management by telemedicine. The patient expressed understanding and agreed to proceed.   Review of Systems    DEFERRED  TO PCP    Cardiac Risk Factors include: advanced age (>54mn, >>29women);diabetes mellitus;dyslipidemia;hypertension     Objective:    Today's Vitals   11/08/21 1355 11/08/21 1356  Weight: 266 lb 8 oz (120.9 kg)   Height: 5' 8"  (1.727 m)   PainSc:  0-No pain   Body mass index is 40.52 kg/m.     11/08/2021    1:59 PM 11/08/2021    1:31 PM 05/06/2021   11:41 AM 04/29/2021   12:23 PM 04/22/2021    1:16 PM 03/18/2021    1:18 PM 02/18/2021    1:22 PM  Advanced Directives  Does Patient Have a Medical Advance Directive? No No No No No No No  Copy of Healthcare Power of Attorney in Chart?    No - copy requested     Would patient like information on creating a medical advance directive? No - Patient declined Yes (ED - Information included in AVS) No - Patient declined No - Patient declined Yes (MAU/Ambulatory/Procedural Areas - Information given) No - Patient declined No - Patient declined    Current Medications (verified) Outpatient Encounter Medications as of 11/08/2021  Medication Sig   Accu-Chek Softclix Lancets lancets Use to check blood glucose up to three times a day. Dx code E 11.9   acetaminophen (TYLENOL) 500 MG tablet Take 500-1,000 mg by mouth every 6 (six) hours as needed for moderate pain or headache.   Ascorbic Acid (VITAMIN C) 1000 MG tablet Take 1,000 mg by mouth daily. Rose hips   aspirin EC 81 MG tablet Take 81 mg by mouth daily. Swallow whole.   atorvastatin (LIPITOR) 40 MG tablet Take 1 tablet (40 mg total) by mouth daily.   Biotin  5000 MCG CAPS Take 5,000 mcg by mouth daily.   Blood Glucose Monitoring Suppl (ACCU-CHEK AVIVA PLUS) w/Device KIT Use to monitor blood sugars   Blood Pressure Monitoring (ADULT BLOOD PRESSURE CUFF LG) KIT Use to monitor your BP daily and bring log during each visit   brimonidine-timolol (COMBIGAN) 0.2-0.5 % ophthalmic solution Place 1 drop into both eyes every 12 (twelve) hours. Place one drop to both eyes twice daily for glaucoma (Patient taking differently: Place 1 drop into both eyes 2 (two) times daily. 8 am and 1700)   calcium carbonate (TUMS - DOSED IN MG ELEMENTAL CALCIUM) 500 MG chewable tablet Chew 500-1,000 mg by mouth daily as needed for indigestion or heartburn.   Cholecalciferol (VITAMIN D) 50 MCG (2000 UT) tablet Take 2,000 Units by mouth daily.   COLLAGEN PO Take 100 mg by mouth daily.   cycloSPORINE (RESTASIS) 0.05 % ophthalmic emulsion Place 1 drop into both eyes 2 (two) times daily. 8 am and 1700   docusate sodium (COLACE) 100 MG capsule Take 1 capsule (100 mg total) by mouth 2 (two) times daily. While taking narcotic pain medicine.   Emollient (BAG BALM EX) Apply 1 application topically daily.   ferrous sulfate 325 (65 FE) MG tablet Take 1 tablet (325 mg total) by mouth daily with  breakfast.   glucose blood (ACCU-CHEK AVIVA) test strip Use to check blood glucose up to three times a day. Dx code E 11.9   lisinopril (ZESTRIL) 20 MG tablet Take 1 tablet (20 mg total) by mouth daily.   Multiple Vitamin (MULTIVITAMIN) tablet Take 1 tablet by mouth daily. 50 plus   Netarsudil-Latanoprost (ROCKLATAN) 0.02-0.005 % SOLN Place 1 drop into both eyes daily at 10 pm.   pantoprazole (PROTONIX) 20 MG tablet Take 1 tablet (20 mg total) by mouth 2 (two) times daily.   Polyvinyl Alcohol-Povidone (REFRESH OP) Place 1 drop into both eyes daily as needed (dry eyes).   SitaGLIPtin-MetFORMIN HCl (JANUMET XR) 50-500 MG TB24 Take 1 tablet by mouth 2 (two) times daily.   Vitamin A 2400 MCG (8000 UT) TABS  Take 8,000 Units by mouth daily.   vitamin B-12 (CYANOCOBALAMIN) 500 MCG tablet Take 500 mcg by mouth daily.   vitamin E 180 MG (400 UNITS) capsule Take 400 Units by mouth daily.   Zinc 50 MG CAPS Take 50 mg by mouth daily.    [DISCONTINUED] lisinopril (ZESTRIL) 20 MG tablet Take 1 tablet (20 mg total) by mouth daily.   [DISCONTINUED] pravastatin (PRAVACHOL) 20 MG tablet Take 1 tablet (20 mg total) by mouth daily.   [DISCONTINUED] SitaGLIPtin-MetFORMIN HCl (JANUMET XR) 50-500 MG TB24 Take 1 tablet by mouth 2 (two) times daily.   No facility-administered encounter medications on file as of 11/08/2021.    Allergies (verified) Codeine, Pravastatin, and Latex   History: Past Medical History:  Diagnosis Date   Arthritis    Chronic normocytic anemia    CKD (chronic kidney disease) stage 3, GFR 30-59 ml/min ([place]) 02/02/2013   Diabetes mellitus type II, controlled ([place]) 06/17/2009   Diabetic ulcer of both feet ([place]) 01/07/2010   Qualifier: Diagnosis of  By: Ina Homes MD, Amanjot     Essential hypertension 06/17/2009   GERD (gastroesophageal reflux disease)    Glaucoma    H/O hiatal hernia    Hardware complicating wound infection ([place]) 12/28/2020   Hyperlipidemia    MRSA (methicillin resistant Staphylococcus aureus)    Obstructive sleep apnea 05/26/2010   Polymicrobial bacterial infection 12/28/2020   Past Surgical History:  Procedure Laterality Date   ACHILLES TENDON LENGTHENING Left 11/06/2014   Procedure: LEFT ACHILLES TENDON LENGTHENING, ;  Surgeon: [name] Simmer, MD;  Location: [place];  Service: Orthopedics;  Laterality: Left;   APPENDECTOMY  1975   FOOT ARTHRODESIS Left 11/06/2014   Procedure: TALONAVICULAR CANCELLOUS CUBOID ARTHRODESIS WITH CORRECTIVE OSTEOTOMY ;  Surgeon: [name] Simmer, MD;  Location: [place];  Service: Orthopedics;  Laterality: Left;   FOOT ARTHRODESIS Right 05/14/2020   Procedure: Right heelcord lengthening, subtalar and talonavicular arthrodesis, talus exostectomy,  talus corrective osteotomy;  Surgeon: [name] Simmer, MD;  Location: [place];  Service: Orthopedics;  Laterality: Right;   HARDWARE REMOVAL Left 02/19/2015   Procedure: REMOVAL OF LEFT FOOT EXTERNAL FIXATOR, CASTING OF LEFT ANKLE ;  Surgeon: [name] Simmer, MD;  Location: [place];  Service: Orthopedics;  Laterality: Left;   HARDWARE REMOVAL Right 12/17/2020   Procedure: Removal of deep implant right foot;  Surgeon: [name] Simmer, MD;  Location: [place];  Service: Orthopedics;  Laterality: Right;   I & D EXTREMITY Left 2011   "ulcer on my foot got infected" (01/30/2013)   I & D EXTREMITY Right 05/06/2021   Procedure: Irrigation and excisional debridement of right ankle abcess;  Surgeon: [name] Simmer, MD;  Location: [place];  Service: Orthopedics;  Laterality: Right;  53mn   MULTIPLE TOOTH EXTRACTIONS Bilateral    OOPHORECTOMY Bilateral 1975-1976   Family History  Problem Relation Age of Onset   Diabetes Mother    Hypertension Mother    Dementia Mother    Heart disease Father    Stroke Father    Diabetes Father    Hypertension Father    Gout Father    Deep vein thrombosis Father    Breast cancer Cousin    Breast cancer Cousin    Social History   Socioeconomic History   Marital status: Single    Spouse name: Not on file   Number of children: Not on file   Years of education: Not on file   Highest education level: Not on file  Occupational History   Occupation: Disabled  Tobacco Use   Smoking status: Former    Packs/day: 0.50    Years: 0.00    Total pack years: 0.00    Types: Cigarettes    Quit date: 12/22/1972    Years since quitting: 48.9   Smokeless tobacco: Never  Vaping Use   Vaping Use: Never used  Substance and Sexual Activity   Alcohol use: No   Drug use: No   Sexual activity: Not Currently  Other Topics Concern   Not on file  Social History Narrative   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   Smoked  when she was a teenager   no alcohol   no Drugs      Current Social History 10/26/2018        Patient lives on the main level of a 3 floor boarding house with 3 other tenants. There are 3 steps with handrails up to the entrance the patient uses.       Patient's method of transportation is personal car.      The highest level of education was Bachelor's Degree.      The patient currently disabled.      Identified important Relationships are "My cousins, PJonette Evaand LIlean Skill and my dear friend BDimas Chyle"      Pets : None       Interests / Fun: "I love to read, write, photography: go around [place] photos of the [place] [employer], [employer], cooking, hanging out with friends, several ministries with my [place] especially working with the youth."      Current Stressors: "Worrying about someone to care for me if anything happens to me. 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[place], BSN, RN-BC          Social Determinants of Health   Financial Resource Strain: Low Risk  (11/08/2021)   Overall Financial Resource Strain (CARDIA)    Difficulty of Paying Living Expenses: Not hard at all  Food Insecurity: No Food Insecurity (11/08/2021)   Hunger Vital Sign    Worried About Running Out of Food in the Last Year: Never true    Ran Out of Food in the Last Year: Never true  Transportation Needs: No Transportation Needs (11/08/2021)   PRAPARE - THydrologist(Medical): No    Lack of Transportation (Non-Medical): No  Physical Activity: Insufficiently Active (11/08/2021)   Exercise Vital Sign    Days of Exercise per Week: 3 days    Minutes of  Exercise per Session: 30 min  Stress: No Stress Concern Present (11/08/2021)   [place]    Feeling of Stress : Not at all  Social Connections:  Moderately Integrated (11/08/2021)   Social Connection and Isolation Panel [NHANES]    Frequency of Communication with Friends and Family: More than three times a week    Frequency of Social Gatherings with Friends and Family: Three times a week    Attends Religious Services: More than 4 times per year    Active Member of Clubs or Organizations: Yes    Attends Music therapist: More than 4 times per year    Marital Status: Never married    Tobacco Counseling Counseling given: Not Answered   Clinical Intake:  Pre-visit preparation completed: Yes  Pain : No/denies pain Pain Score: 0-No pain     BMI - recorded: 40 Nutritional Status: BMI > 30  Obese Nutritional Risks: None Diabetes: Yes Did pt. bring in CBG monitor from home?: Yes Glucose Meter Downloaded?: Yes  How often do you need to have someone help you when you read instructions, pamphlets, or other written materials from your doctor or pharmacy?: 1 - Never What is the last grade level you completed in school?: college  Diabetic?YES   Interpreter Needed?: No  Information entered by :: Kendra Woolford   Activities of Daily Living    11/08/2021    2:00 PM 11/08/2021    1:30 PM  In your present state of health, do you have any difficulty performing the following activities:  Hearing? 0 0  Vision? 0 0  Difficulty concentrating or making decisions? 0 0  Walking or climbing stairs? 0 0  Dressing or bathing? 0 0  Doing errands, shopping? 0 0  Preparing Food and eating ? N   Using the Toilet? N   In the past six months, have you accidently leaked urine? N   Do you have problems with loss of bowel control? N   Managing your Medications? N   Managing your Finances? N   Housekeeping or managing your Housekeeping? N     Patient Care Team: Leigh Aurora, DO as PCP - General Desma Maxim, MD as Referring Physician (Ophthalmology) Corky Sing, PA-C as Physician Assistant (Physician  Assistant) [name] Simmer, MD as Consulting Physician (Orthopedic Surgery) Juluis Rainier as Consulting Physician (Optometry) Shaft, Rick (Inactive) (Orthotics) [name], MD as Consulting Physician (Gastroenterology)  Indicate any recent Medical Services you may have received from other than [facility] providers in the past year (date may be approximate).     Assessment:   This is a routine wellness examination for [place].  Hearing/Vision screen No results found.  Dietary issues and exercise activities discussed: Current Exercise Habits: Home exercise routine, Type of exercise: walking;stretching, Time (Minutes): 30, Frequency (Times/Week): 3, Weekly Exercise (Minutes/Week): 90, Intensity: Moderate   Goals Addressed   None   Depression Screen    11/08/2021    1:58 PM 11/08/2021    1:30 PM 04/22/2021    1:16 PM 03/18/2021    1:16 PM 03/04/2021    9:52 AM 02/18/2021    1:59 PM 01/21/2021    3:05 PM  PHQ 2/9 Scores  PHQ - 2 Score 0 0 0 0 0 0 0  PHQ- 9 Score      0 0    Fall Risk    11/08/2021    1:59 PM 11/08/2021    1:29 PM 04/22/2021  1:16 PM 03/18/2021    1:15 PM 03/04/2021    9:52 AM  Fall Risk   Falls in the past year? 0 0 0 0 0  Number falls in past yr: 0 0  0   Injury with Fall? 0 0  0   Risk for fall due to : Impaired balance/gait Impaired balance/gait   Impaired balance/gait;Impaired mobility  Follow up Falls evaluation completed;Falls prevention discussed Falls evaluation completed;Falls prevention discussed Falls evaluation completed Falls evaluation completed Falls evaluation completed    FALL RISK PREVENTION PERTAINING TO THE HOME:  Any stairs in or around the home? Yes  If so, are there any without handrails? No  Home free of loose throw rugs in walkways, pet beds, electrical cords, etc? Yes  Adequate lighting in your home to reduce risk of falls? Yes   ASSISTIVE DEVICES UTILIZED TO PREVENT FALLS:  Life alert? No  Use of a cane, walker or w/c? Yes  Grab  bars in the bathroom? No  Shower chair or bench in shower? Yes  Elevated toilet seat or a handicapped toilet? Yes   TIMED UP AND GO:  Was the test performed? NO .  Length of time to ambulate 10 feet: N/A sec.     Cognitive Function:        11/08/2021    2:01 PM  6CIT Screen  What Year? 0 points  What month? 0 points  What time? 0 points  Count back from 20 0 points  Months in reverse 0 points  Repeat phrase 0 points  Total Score 0 points    Immunizations Immunization History  Administered Date(s) Administered   Fluad Quad(high Dose 65+) 12/24/2020, 11/08/2021   Influenza Split 12/24/2010, 10/07/2011   Influenza Whole 09/24/2009   Influenza,inj,Quad PF,6+ Mos 10/10/2012, 09/11/2013, 10/02/2014, 12/24/2015, 01/06/2017, 10/11/2017, 12/03/2018, 12/17/2019   Influenza-Unspecified 12/29/2016   PFIZER(Purple Top)SARS-COV-2 Vaccination 08/30/2019, 09/11/2019, 09/11/2019, 03/06/2020   PNEUMOCOCCAL CONJUGATE-20 03/04/2021   PPD Test 11/24/2014   Pneumococcal Conjugate-13 01/01/2015   Pneumococcal Polysaccharide-23 12/29/2009, 07/21/2016   Td 12/29/2009   Tdap 03/18/2021   Zoster, Live 01/26/2016    TDAP status: Up to date  Flu Vaccine status: Up to date  Pneumococcal vaccine status: Up to date  Covid-19 vaccine status: Completed vaccines  Qualifies for Shingles Vaccine? Yes   Zostavax completed No   Shingrix Completed?: No.    Education has been provided regarding the importance of this vaccine. Patient has been advised to call insurance company to determine out of pocket expense if they have not yet received this vaccine. Advised may also receive vaccine at local pharmacy or Health Dept. Verbalized acceptance and understanding.  Screening Tests Health Maintenance  Topic Date Due   Zoster Vaccines- Shingrix (1 of 2) Never done   MAMMOGRAM  03/21/2019   Diabetic kidney evaluation - Urine ACR  08/30/2019   COVID-19 Vaccine (5 - Pfizer series) 05/01/2020   DEXA SCAN   Never done   OPHTHALMOLOGY EXAM  12/31/2021   FOOT EXAM  01/21/2022   LIPID PANEL  03/19/2022   Diabetic kidney evaluation - GFR measurement  05/04/2022   HEMOGLOBIN A1C  05/10/2022   COLONOSCOPY (Pts 45-41yr Insurance coverage will need to be confirmed)  07/08/2027   TETANUS/TDAP  03/19/2031   Pneumonia Vaccine 66 Years old  Completed   INFLUENZA VACCINE  Completed   Hepatitis C Screening  Completed   HPV VACCINES  Aged Out   COLON CANCER SCREENING ANNUAL FOBT  Discontinued    Health  Maintenance  Health Maintenance Due  Topic Date Due   Zoster Vaccines- Shingrix (1 of 2) Never done   MAMMOGRAM  03/21/2019   Diabetic kidney evaluation - Urine ACR  08/30/2019   COVID-19 Vaccine (5 - Pfizer series) 05/01/2020   DEXA SCAN  Never done    Colorectal cancer screening: Type of screening: Colonoscopy. Completed 03/18/2021. Repeat every 10 years  Mammogram status: Completed 03/20/2017. Over due  was to return in 2 years  Bone Density status: Completed 12/24/2020. Results reflect: Bone density results: NORMAL. Repeat every NO LONGER REQUIRED     Lung Cancer Screening: (Low Dose CT Chest recommended if Age 47-80 years, 30 pack-year currently smoking OR have quit w/in 15years.) does not qualify.   Lung Cancer Screening Referral: DEFERRED TO PCP   Additional Screening:  Hepatitis C Screening: does qualify; Completed 08/25/2016  Vision Screening: Recommended annual ophthalmology exams for early detection of glaucoma and other disorders of the eye. Is the patient up to date with their annual eye exam?  Yes  Who is the provider or what is the name of the office in which the patient attends annual eye exams? DR Idolina Primer  If pt is not established with a provider, would they like to be referred to a provider to establish care? No .   Dental Screening: Recommended annual dental exams for proper oral hygiene  Community Resource Referral / Chronic Care Management: CRR required this visit?  No    CCM required this visit?  No      Plan:     I have personally reviewed and noted the following in the patient's chart:   Medical and social history Use of alcohol, tobacco or illicit drugs  Current medications and supplements including opioid prescriptions. Patient is not currently taking opioid prescriptions. Functional ability and status Nutritional status Physical activity Advanced directives List of other physicians Hospitalizations, surgeries, and ER visits in previous 12 months Vitals Screenings to include cognitive, depression, and falls Referrals and appointments  In addition, I have reviewed and discussed with patient certain preventive protocols, quality metrics, and best practice recommendations. A written personalized care plan for preventive services as well as general preventive health recommendations were provided to patient.     Judyann Munson, CMA   11/08/2021   Nurse Notes: IN PERSON [facility]     Ms. Boykin Reaper , Thank you for taking time to come for your Medicare Wellness Visit. I appreciate your ongoing commitment to your health goals. Please review the following plan we discussed and let me know if I can assist you in the future.   These are the goals we discussed:  Goals      HEMOGLOBIN A1C < 7.0     Plan meals and snacks to have consistent carb content     Weight (lb) < 254 lb (115.2 kg)     5% weight loss        This is a list of the screening recommended for you and due dates:  Health Maintenance  Topic Date Due   Zoster (Shingles) Vaccine (1 of 2) Never done   Mammogram  03/21/2019   Yearly kidney health urinalysis for diabetes  08/30/2019   COVID-19 Vaccine (5 - Pfizer series) 05/01/2020   DEXA scan (bone density measurement)  Never done   Eye exam for diabetics  12/31/2021   Complete foot exam   01/21/2022   Lipid (cholesterol) test  03/19/2022   Yearly kidney function blood test for diabetes  05/04/2022   Hemoglobin A1C   05/10/2022   Colon Cancer Screening  07/08/2027   Tetanus Vaccine  03/19/2031   Pneumonia Vaccine  Completed   Flu Shot  Completed   Hepatitis C Screening: USPSTF Recommendation to screen - Ages 18-79 yo.  Completed   HPV Vaccine  Aged Out   Stool Blood Test  Discontinued

## 2021-11-08 NOTE — Assessment & Plan Note (Signed)
Pap smear deferred.  DEXA scan ordered.  Mammogram ordered.  Flu shot administered

## 2021-11-08 NOTE — Progress Notes (Addendum)
CC: Diabetes follow-up  HPI:  [name] is a 66 y.o. female with a past medical history of hypertension, type 2 diabetes, and CKD stage IIIb who presents for diabetes follow-up.  Please see assessment and plan for full HPI  Past Medical History:  Diagnosis Date   Arthritis    Chronic normocytic anemia    CKD (chronic kidney disease) stage 3, GFR 30-59 ml/min ([place]) 02/02/2013   Diabetes mellitus type II, controlled ([facility]) 06/17/2009   Diabetic ulcer of both feet ([place]) 01/07/2010   Qualifier: Diagnosis of  By: Ina Homes MD, Amanjot     Essential hypertension 06/17/2009   GERD (gastroesophageal reflux disease)    Glaucoma    H/O hiatal hernia    Hardware complicating wound infection ([place]) 12/28/2020   Hyperlipidemia    MRSA (methicillin resistant Staphylococcus aureus)    Obstructive sleep apnea 05/26/2010   Polymicrobial bacterial infection 12/28/2020     Current Outpatient Medications:    Accu-Chek Softclix Lancets lancets, Use to check blood glucose up to three times a day. Dx code E 11.9, Disp: 100 each, Rfl: 12   acetaminophen (TYLENOL) 500 MG tablet, Take 500-1,000 mg by mouth every 6 (six) hours as needed for moderate pain or headache., Disp: , Rfl:    Ascorbic Acid (VITAMIN C) 1000 MG tablet, Take 1,000 mg by mouth daily. Rose hips, Disp: , Rfl:    aspirin EC 81 MG tablet, Take 81 mg by mouth daily. Swallow whole., Disp: , Rfl:    atorvastatin (LIPITOR) 40 MG tablet, Take 1 tablet (40 mg total) by mouth daily., Disp: 90 tablet, Rfl: 3   Biotin 5000 MCG CAPS, Take 5,000 mcg by mouth daily., Disp: , Rfl:    Blood Glucose Monitoring Suppl (ACCU-CHEK AVIVA PLUS) w/Device KIT, Use to monitor blood sugars, Disp: 1 kit, Rfl: 0   Blood Pressure Monitoring (ADULT BLOOD PRESSURE CUFF LG) KIT, Use to monitor your BP daily and bring log during each visit, Disp: 1 kit, Rfl: 0   brimonidine-timolol (COMBIGAN) 0.2-0.5 % ophthalmic solution, Place 1 drop into both eyes every 12  (twelve) hours. Place one drop to both eyes twice daily for glaucoma (Patient taking differently: Place 1 drop into both eyes 2 (two) times daily. 8 am and 1700), Disp: 15 mL, Rfl: 0   calcium carbonate (TUMS - DOSED IN MG ELEMENTAL CALCIUM) 500 MG chewable tablet, Chew 500-1,000 mg by mouth daily as needed for indigestion or heartburn., Disp: , Rfl:    Cholecalciferol (VITAMIN D) 50 MCG (2000 UT) tablet, Take 2,000 Units by mouth daily., Disp: , Rfl:    COLLAGEN PO, Take 100 mg by mouth daily., Disp: , Rfl:    cycloSPORINE (RESTASIS) 0.05 % ophthalmic emulsion, Place 1 drop into both eyes 2 (two) times daily. 8 am and 1700, Disp: , Rfl:    docusate sodium (COLACE) 100 MG capsule, Take 1 capsule (100 mg total) by mouth 2 (two) times daily. While taking narcotic pain medicine., Disp: 30 capsule, Rfl: 0   Emollient (BAG BALM EX), Apply 1 application topically daily., Disp: , Rfl:    ferrous sulfate 325 (65 FE) MG tablet, Take 1 tablet (325 mg total) by mouth daily with breakfast., Disp: 90 tablet, Rfl: 1   glucose blood (ACCU-CHEK AVIVA) test strip, Use to check blood glucose up to three times a day. Dx code E 11.9, Disp: 100 each, Rfl: 12   lisinopril (ZESTRIL) 20 MG tablet, Take 1 tablet (20 mg total) by mouth daily.,  Disp: 90 tablet, Rfl: 3   Multiple Vitamin (MULTIVITAMIN) tablet, Take 1 tablet by mouth daily. 50 plus, Disp: , Rfl:    Netarsudil-Latanoprost (ROCKLATAN) 0.02-0.005 % SOLN, Place 1 drop into both eyes daily at 10 pm., Disp: , Rfl:    pantoprazole (PROTONIX) 20 MG tablet, Take 1 tablet (20 mg total) by mouth 2 (two) times daily., Disp: 180 tablet, Rfl: 2   Polyvinyl Alcohol-Povidone (REFRESH OP), Place 1 drop into both eyes daily as needed (dry eyes)., Disp: , Rfl:    SitaGLIPtin-MetFORMIN HCl (JANUMET XR) 50-500 MG TB24, Take 1 tablet by mouth 2 (two) times daily., Disp: 180 tablet, Rfl: 0   Vitamin A 2400 MCG (8000 UT) TABS, Take 8,000 Units by mouth daily., Disp: , Rfl:    vitamin  B-12 (CYANOCOBALAMIN) 500 MCG tablet, Take 500 mcg by mouth daily., Disp: , Rfl:    vitamin E 180 MG (400 UNITS) capsule, Take 400 Units by mouth daily., Disp: , Rfl:    Zinc 50 MG CAPS, Take 50 mg by mouth daily. , Disp: , Rfl:   Review of Systems:    Constitutional: Patient reports fatigue and sleep disturbance   Physical Exam:  Vitals:   11/08/21 1324  BP: 129/65  Pulse: 64  Temp: 98 F (36.7 C)  TempSrc: Oral  SpO2: 100%  Weight: 266 lb 8 oz (120.9 kg)  Height: 5' 8"  (1.727 m)    General: Patient is sitting comfortably in the room  Head: Normocephalic, atraumatic  Cardio: Regular rate and rhythm, no murmurs, rubs or gallops. 2+ pulses to bilateral upper and lower extremities  Pulmonary: Clear to ausculation bilaterally with no rales, rhonchi, and crackles  Neuro: Intact sensation to bilateral lower extremities Skin: Denies healing wound to medial surface of right foot MSK: 5/5 strength to upper and lower extremities.    Assessment & Plan:   Diabetes mellitus type II, controlled [facility]) Patient presents for diabetes follow-up.  Patient is A1C today 6.8.  This is at goal.  Goal is less than 7.  Patient reports that she is compliant with her medications.  She states that she is doing well.  She is watching what she eats.  She states that she has become more active since her surgery and states that she wants to keep it that way.  She notes that she is trying to watch what she eats.  On exam, she has good sensation to her bilateral lower extremities.  Well-healing scar on medial surface of right foot.  Patient does have a meter, but only checks it occasionally.  Over the past month she is only checked it 6 times.  Plan: -Foot exam today normal -Albumin creatinine ratio today -Continue Janumet 50-500 twice daily -Return in 3 months for follow-up  Essential hypertension Patient's blood pressure at 129/65 today.  This is at goal.  Patient has good medication compliance.  Patient  is managed with lisinopril 20 mg daily.  Plan: -Continue lisinopril 20 mg daily  Sleep disturbance Patient reports few week history of waking up at 2 AM.  She reports that this is something new.  She states that she is often very tired during the day.  She states that when she sits down, she will just fall asleep.  She states that she would just start nodding off while sitting down.  She denies doing this during driving.  She does report falling asleep during church.  She attributes this to not sleeping well.  She does endorse that she  does drink caffeine before bed, and this could be playing a role.  She also reports having more stress in her life recently.  Anemia and hypothyroidism was taken under consideration, but blood counts look assuring, and no signs of bleeding.  Given history, hypothyroidism less likely, could evaluate later if it does not resolve.  Plan: -Continue good sleep hygiene -Handouts provided for good sleep hygiene -Avoid caffeine during bedtime  Diabetic osteomyelitis ([place]) Patient's most recent surgery was 6 months ago.  Patient is doing well.  Patient does request a motorized scooter as she recovers.  Patient is almost out of boot.  Patient is to follow-up with orthopedics in November 2023.  Patient is in a boot, and patient has trouble mobilizing at times with pain.  Patient is recovering well, but still has mobility issues.  Until patient is fully healed, I think it is appropriate to get her a mobility scooter.  Plan: -Following Ortho recommendations -Start paperwork for motorized scooter  Healthcare maintenance Pap smear deferred.  DEXA scan ordered.  Mammogram ordered.  Flu shot administered  Obstructive sleep apnea Patient reports good compliance with her CPAP.  No concerns.  She wears about 7 hours every night.  She does note that she wakes up at 2 AM for the past 2 weeks.  She denies waking up gasping for air.  She states that her eyes is  open.  Plan: -Continue CPAP   Patient discussed with Dr. Reeves Forth, DO PGY-1 Internal Medicine Resident  Pager: [phone]

## 2021-11-09 NOTE — Addendum Note (Signed)
Addended by: Leigh Aurora on: 11/09/2021 08:43 AM   Modules accepted: Orders

## 2021-11-10 LAB — MICROALBUMIN / CREATININE URINE RATIO
Creatinine, Urine: 95.8 mg/dL
Microalb/Creat Ratio: 4 mg/g creat (ref 0–29)
Microalbumin, Urine: 3.5 ug/mL

## 2021-11-11 ENCOUNTER — Telehealth: Payer: Self-pay | Admitting: Student

## 2021-11-11 DIAGNOSIS — Z0000 Encounter for general adult medical examination without abnormal findings: Secondary | ICD-10-CM

## 2021-11-11 NOTE — Telephone Encounter (Signed)
Rec'd a call from the patient stating she is unable to be sch for her Bone Density Exam with her mammogram.   Please advise if a new order can be placed per the message below from the [facility].  A new dx is needed in order for her ins to pay for the bone density exam to be performed.  "11/11/2021: DR GOING TO CALL DR TO UPDATE THE DIAGNOSIS FOR THIS EXAM-AJ"

## 2021-11-11 NOTE — Progress Notes (Signed)
Microalbumin/creatinine ratio within normal limits.  Hemoglobin A1c 6.8, this is at goal at less than 7.  Called to inform patient, but patient did not pick up, will try again tomorrow.  For now same plan with Janumet 50-500 twice daily

## 2021-11-18 NOTE — Progress Notes (Signed)
Internal Medicine Clinic Attending  Case and documentation reviewed.  I reviewed the AWV findings.  I agree with the assessment, diagnosis, and plan of care documented in the AWV note.     

## 2021-11-26 ENCOUNTER — Ambulatory Visit
Admission: RE | Admit: 2021-11-26 | Discharge: 2021-11-26 | Disposition: A | Discharge status: Home / Self Care | Payer: Medicare Other | Source: Ambulatory Visit | Attending: Internal Medicine | Admitting: Internal Medicine

## 2021-11-26 DIAGNOSIS — Z0000 Encounter for general adult medical examination without abnormal findings: Secondary | ICD-10-CM

## 2021-11-26 DIAGNOSIS — Z1231 Encounter for screening mammogram for malignant neoplasm of breast: Secondary | ICD-10-CM | POA: Diagnosis not present

## 2021-11-29 ENCOUNTER — Telehealth: Payer: Self-pay | Admitting: Student

## 2021-11-29 ENCOUNTER — Other Ambulatory Visit: Payer: Self-pay | Admitting: Student

## 2021-11-29 DIAGNOSIS — Z1231 Encounter for screening mammogram for malignant neoplasm of breast: Secondary | ICD-10-CM

## 2021-11-29 NOTE — Telephone Encounter (Signed)
Rec'd a 3rd call from the [facility] requesting that the Dx Code be Change from hte following in order for the patient ins to cover her Bone Density Exam.  screening  Dx: Healthcare maintenance [Z00.00 (ICD-10-CM)]   Is not accepted.  Please advsie if the Code can be cahnged.

## 2022-01-13 ENCOUNTER — Other Ambulatory Visit: Payer: Self-pay | Admitting: Internal Medicine

## 2022-01-18 ENCOUNTER — Other Ambulatory Visit: Payer: Self-pay | Admitting: Internal Medicine

## 2022-01-21 DIAGNOSIS — M14671 Charcot's joint, right ankle and foot: Secondary | ICD-10-CM | POA: Diagnosis not present

## 2022-01-21 DIAGNOSIS — M14672 Charcot's joint, left ankle and foot: Secondary | ICD-10-CM | POA: Diagnosis not present

## 2022-01-21 DIAGNOSIS — E1140 Type 2 diabetes mellitus with diabetic neuropathy, unspecified: Secondary | ICD-10-CM | POA: Diagnosis not present

## 2022-01-22 ENCOUNTER — Other Ambulatory Visit: Payer: Self-pay | Admitting: Internal Medicine

## 2022-02-07 ENCOUNTER — Encounter: Payer: Self-pay | Admitting: Student

## 2022-02-07 ENCOUNTER — Ambulatory Visit (INDEPENDENT_AMBULATORY_CARE_PROVIDER_SITE_OTHER): Payer: 59 | Admitting: Student

## 2022-02-07 VITALS — BP 127/63 | HR 71 | Temp 98.2°F | Wt 284.0 lb

## 2022-02-07 DIAGNOSIS — Z87891 Personal history of nicotine dependence: Secondary | ICD-10-CM

## 2022-02-07 DIAGNOSIS — K219 Gastro-esophageal reflux disease without esophagitis: Secondary | ICD-10-CM

## 2022-02-07 DIAGNOSIS — D509 Iron deficiency anemia, unspecified: Secondary | ICD-10-CM | POA: Diagnosis not present

## 2022-02-07 DIAGNOSIS — E1122 Type 2 diabetes mellitus with diabetic chronic kidney disease: Secondary | ICD-10-CM

## 2022-02-07 DIAGNOSIS — I129 Hypertensive chronic kidney disease with stage 1 through stage 4 chronic kidney disease, or unspecified chronic kidney disease: Secondary | ICD-10-CM | POA: Diagnosis not present

## 2022-02-07 DIAGNOSIS — M8618 Other acute osteomyelitis, other site: Secondary | ICD-10-CM | POA: Diagnosis not present

## 2022-02-07 DIAGNOSIS — E669 Obesity, unspecified: Secondary | ICD-10-CM

## 2022-02-07 DIAGNOSIS — Z6841 Body Mass Index (BMI) 40.0 and over, adult: Secondary | ICD-10-CM

## 2022-02-07 DIAGNOSIS — E538 Deficiency of other specified B group vitamins: Secondary | ICD-10-CM

## 2022-02-07 DIAGNOSIS — E1169 Type 2 diabetes mellitus with other specified complication: Secondary | ICD-10-CM

## 2022-02-07 DIAGNOSIS — Z0000 Encounter for general adult medical examination without abnormal findings: Secondary | ICD-10-CM

## 2022-02-07 DIAGNOSIS — D649 Anemia, unspecified: Secondary | ICD-10-CM

## 2022-02-07 DIAGNOSIS — N1832 Chronic kidney disease, stage 3b: Secondary | ICD-10-CM | POA: Diagnosis not present

## 2022-02-07 DIAGNOSIS — E559 Vitamin D deficiency, unspecified: Secondary | ICD-10-CM

## 2022-02-07 DIAGNOSIS — E11610 Type 2 diabetes mellitus with diabetic neuropathic arthropathy: Secondary | ICD-10-CM

## 2022-02-07 DIAGNOSIS — I10 Essential (primary) hypertension: Secondary | ICD-10-CM | POA: Diagnosis not present

## 2022-02-07 LAB — POCT GLYCOSYLATED HEMOGLOBIN (HGB A1C): Hemoglobin A1C: 7.3 % — AB (ref 4.0–5.6)

## 2022-02-07 LAB — GLUCOSE, CAPILLARY: Glucose-Capillary: 158 mg/dL — ABNORMAL HIGH (ref 70–99)

## 2022-02-07 MED ORDER — SEMAGLUTIDE(0.25 OR 0.5MG/DOS) 2 MG/3ML ~~LOC~~ SOPN: PEN_INJECTOR | SUBCUTANEOUS | 1 refills | Status: DC

## 2022-02-07 MED ORDER — BAG BALM EX OINT: TOPICAL_OINTMENT | CUTANEOUS | 5 refills | Status: DC

## 2022-02-07 NOTE — Progress Notes (Signed)
CC: Follow-up  HPI:  [name] is a 67 y.o. female with PMH as below who presents to clinic to follow-up on her chronic medical problems. Please see problem based charting for evaluation, assessment and plan.  Past Medical History:  Diagnosis Date   Arthritis    Chronic normocytic anemia    CKD (chronic kidney disease) stage 3, GFR 30-59 ml/min ([place]) 02/02/2013   Diabetes mellitus type II, controlled ([place]) 06/17/2009   Diabetic ulcer of both feet ([place]) 01/07/2010   Qualifier: Diagnosis of  By: Ina Homes MD, Amanjot     Essential hypertension 06/17/2009   GERD (gastroesophageal reflux disease)    Glaucoma    H/O hiatal hernia    Hardware complicating wound infection ([place]) 12/28/2020   Hyperlipidemia    MRSA (methicillin resistant Staphylococcus aureus)    Obstructive sleep apnea 05/26/2010   Polymicrobial bacterial infection 12/28/2020    Review of Systems:  Constitutional: Positive for occasional fatigue. Eyes: Negative for visual changes Cardiac: Negative for chest pain MSK: Positive for occasional leg cramps and feet pain Neuro: Negative for headache, numbness, tingling or weakness  Physical Exam: General: Pleasant, obese female. No acute distress. Cardiac: RRR. No murmurs, rubs or gallops. No LE edema Respiratory: Lungs CTAB. No wheezing or crackles. Abdominal: Soft, symmetric and non tender. Normal BS. Skin: Warm, dry and intact without rashes or lesions MSK: Mild Charcot foot deformity R>L. Mild swelling and tenderness around the right ankle but no erythema.  Limited ROM of the right foot.  (See media tab).  Extremities: Radial pulses 2+ and symmetric. Bilateral DP and PT pulses diminished and symmetric. Neuro: A&O x 3. Moves all extremities. Normal sensation to gross touch. Psych: Appropriate mood and affect.  Vitals:   02/07/22 1308 02/07/22 1356  BP: (!) 140/59 127/63  Pulse: 77 71  Temp: 98.2 F (36.8 C)   TempSrc: Oral   SpO2: 100%   Weight: 284  lb (128.8 kg)      Assessment & Plan:   Diabetes mellitus type II, controlled ([place]) Patient here for diabetes follow-up. A1c today 7.3% from 6.8% 3 months ago. Patient has gained 18 pounds in the last 3 months due to inactivity from lower extremity pain as well as the cold weather. Patient motivated to lose weight but mobility limited by her right lower extremity boot. Patient seen by podiatrist last week who recommended a diabetic shoe to help transition out of the boot.  Document completed during office visit for patient to obtain her diabetic shoe. On foot exam today, patient has diminished DP and PT pulses, has warm feet and good feet sensation except diminished sensation on sites 4 (underneath the 1st toes) of both foot. Patient agreeable to plan to initiate Ozempic as well as referral to the supervised exercise program.  Has an appointment with her eye doctor at the end of February.  Plan: -Continue Janumet 50-500 mg twice daily -Start Ozempic 0.25 mg weekly for 4 weeks, then 0.5 mg weekly -Follow-up with ophthalmologist as scheduled on 2/29 and retina specialist as scheduled on 3/21 -Referral to [place].E.P -Follow-up in 3 months for repeat A1c -Check ABI at next office visit  Class 3 obesity ([place]) Patient's weight is up 18 pounds since last office visit in October 2023. She attributes this to her inability to exercise with her right ankle boots on as well as the cold weather.  She will be transition out of the boot soon and plans to be more active.  Patient also counseled  on cutting down foods high in sugar and carbs as well as fatty foods.  Patient agreeable to initiating Ozempic to help with weight loss and diabetes. Body mass index is 43.18 kg/m. Filed Weights   02/07/22 1308  Weight: 284 lb (128.8 kg)   Plan: -Start Ozempic 0.25 mg weekly for 4 weeks, then 0.5 mg weekly -Referral to [place].E.P -Lifestyle modifications with dietary changes and exercise  Diabetic osteomyelitis  ([place]) Patient has had multiple foot infections due to her diabetes requiring multiple surgeries on both foot.  Last surgery in April 2023 after she was found to have an abscess at her right posterior medial ankle.  She follows with [facility] and has been in a Web designer since the I&D. Her right ankle has healed appropriately but patient continues to have trouble with her mobility due to pain. She is currently using a regular walker but interested in rolling walker with wheels and a seat so she can sit down to rest occasionally to take pressure off her feet.  Bilateral foot warm to touch with mild edema of the right foot up to the ankle as well as diminished PT and DP pulses in both foot.  Plan: -ABI at next office visit -Order for DME wheeled rolling walker with a seat -Completed form for diabetic shoes -Follow-up with [facility] as needed  Iron deficiency anemia Patient with history of iron deficiency anemia dating back 12 years ago.  Patient has been on iron supplementation with no recent iron studies on file. Iron studies today shows iron of 30, iron sat 11%, ferritin 42. Patient still iron deficient but CBC stable at 11.9. Last colonoscopy in 2019 was normal. Patient denies any GI bleed or any other signs of bleeding.  Plan: Change ferrous sulfate 325 mg to every other day with breakfast for better absorption -Repeat iron studies in 3 to 6 months  Normocytic anemia Hemoglobin stable at 11.9.  Vitamin B12 >2000. Iron level 30, iron sat 11% and ferritin 42. -Continue iron supplementation -Discontinue vitamin B-12  Healthcare maintenance DEXA scan was ordered last year however patient has not had this done. Vitamin D levels at 56.2. -Will order DEXA scan -Continue vitamin D 2000 units daily -Continue multivitamins  CKD (chronic kidney disease) stage 3, GFR 30-59 ml/min ([facility]) Kidney function stable with improvement in creatinine from 1.57 9 months ago to 1.40. No electrolyte  abnormalities. -Continue lisinopril 20 mg daily  Essential hypertension Patient's BP stable on current regimen.  BMP today shows stable kidney function and no electrolytes abnormalities. Vitals:   02/07/22 1308 02/07/22 1356  BP: (!) 140/59 127/63  -Continue lisinopril 20 mg daily  GERD (gastroesophageal reflux disease) Patient reports she has acid reflux and continues to have occasional reflux symptoms when she eats certain foods.  Patient counseled on avoiding acidic foods such as tomatoes and propping her head up when she sleeps. -Continue pantoprazole 20 mg twice daily. -Follow-up in 4 weeks.    See Encounters Tab for problem based charting.  Patient discussed with Dr.  Louann Liv, MD, MPH

## 2022-02-07 NOTE — Patient Instructions (Addendum)
Thank you, [name] for allowing [place] to provide your care today. Today we discussed your diabetes, Charcot foot, blood pressure and weight gain.   For your diabetes and obesity, I have started you on Ozempic to help improve your A1c as well as help with weight loss.  I have also refer you to an exercise program.  Make sure to follow-up with your ophthalmologist next week for your yearly diabetic eye exam.   I have ordered the following labs for you:  Lab Orders         Glucose, capillary         BMP8+Anion Gap         Vitamin D (25 hydroxy)         Vitamin B12         Ferritin         CBC no Diff         Iron and IBC (CPT-83540,83550)         POC Hbg A1C      I will call if any are abnormal. All of your labs can be accessed through "My Chart".  I have place a referrals to supervised exercise program  I have ordered the following tests:   I have ordered the following medication/changed the following medications:  Start Ozempic 0.25 mg weekly injections for 4 weeks then 0.5 mg weekly injection  My Chart Access: [email or web address]  Please follow-up in 4 weeks  Please make sure to arrive 15 minutes prior to your next appointment. If you arrive late, you may be asked to reschedule.    We look forward to seeing you next time. Please call our clinic at [phone] if you have any questions or concerns. The best time to call is Monday-Friday from 9am-4pm, but there is someone available 24/7. If after hours or the weekend, call the main hospital number and ask for the Internal Medicine Resident On-Call. If you need medication refills, please notify your pharmacy one week in advance and they will send [place] a request.   Thank you for letting us take part in your care. Wishing you the best!  Sarah Axon, MD 02/07/2022, 1:49 PM IM Resident, PGY-3 [name] 41:10

## 2022-02-08 ENCOUNTER — Encounter: Payer: Self-pay | Admitting: Student

## 2022-02-08 DIAGNOSIS — D509 Iron deficiency anemia, unspecified: Secondary | ICD-10-CM | POA: Insufficient documentation

## 2022-02-08 LAB — CBC
Hematocrit: 36.5 % (ref 34.0–46.6)
Hemoglobin: 11.9 g/dL (ref 11.1–15.9)
MCH: 28.5 pg (ref 26.6–33.0)
MCHC: 32.6 g/dL (ref 31.5–35.7)
MCV: 88 fL (ref 79–97)
Platelets: 207 10*3/uL (ref 150–450)
RBC: 4.17 x10E6/uL (ref 3.77–5.28)
RDW: 13.4 % (ref 11.7–15.4)
WBC: 9.7 10*3/uL (ref 3.4–10.8)

## 2022-02-08 LAB — IRON AND TIBC
Iron Saturation: 11 % — ABNORMAL LOW (ref 15–55)
Iron: 30 ug/dL (ref 27–139)
Total Iron Binding Capacity: 273 ug/dL (ref 250–450)
UIBC: 243 ug/dL (ref 118–369)

## 2022-02-08 LAB — BMP8+ANION GAP
Anion Gap: 14 mmol/L (ref 10.0–18.0)
BUN/Creatinine Ratio: 12 (ref 12–28)
BUN: 17 mg/dL (ref 8–27)
CO2: 23 mmol/L (ref 20–29)
Calcium: 9.8 mg/dL (ref 8.7–10.3)
Chloride: 103 mmol/L (ref 96–106)
Creatinine, Ser: 1.4 mg/dL — ABNORMAL HIGH (ref 0.57–1.00)
Glucose: 141 mg/dL — ABNORMAL HIGH (ref 70–99)
Potassium: 4.4 mmol/L (ref 3.5–5.2)
Sodium: 140 mmol/L (ref 134–144)
eGFR: 41 mL/min/{1.73_m2} — ABNORMAL LOW (ref >59)

## 2022-02-08 LAB — VITAMIN B12: Vitamin B-12: >2000 pg/mL — ABNORMAL HIGH (ref 232–1245)

## 2022-02-08 LAB — FERRITIN: Ferritin: 42 ng/mL (ref 15–150)

## 2022-02-08 LAB — VITAMIN D 25 HYDROXY (VIT D DEFICIENCY, FRACTURES): Vit D, 25-Hydroxy: 56.2 ng/mL (ref 30.0–100.0)

## 2022-02-08 MED ORDER — FERROUS SULFATE 325 (65 FE) MG PO TABS: ORAL_TABLET | ORAL | 1 refills | Status: DC

## 2022-02-08 NOTE — Assessment & Plan Note (Signed)
Patient here for diabetes follow-up. A1c today 7.3% from 6.8% 3 months ago. Patient has gained 18 pounds in the last 3 months due to inactivity from lower extremity pain as well as the cold weather. Patient motivated to lose weight but mobility limited by her right lower extremity boot. Patient seen by podiatrist last week who recommended a diabetic shoe to help transition out of the boot.  Document completed during office visit for patient to obtain her diabetic shoe. On foot exam today, patient has diminished DP and PT pulses, has warm feet and good feet sensation except diminished sensation on sites 4 (underneath the 1st toes) of both foot. Patient agreeable to plan to initiate Ozempic as well as referral to the supervised exercise program.  Has an appointment with her eye doctor at the end of February.  Plan: -Continue Janumet 50-500 mg twice daily -Start Ozempic 0.25 mg weekly for 4 weeks, then 0.5 mg weekly -Follow-up with ophthalmologist as scheduled on 2/29 and retina specialist as scheduled on 3/21 -Referral to [place].E.P -Follow-up in 3 months for repeat A1c -Check ABI at next office visit

## 2022-02-08 NOTE — Assessment & Plan Note (Addendum)
Patient's weight is up 18 pounds since last office visit in October 2023. She attributes this to her inability to exercise with her right ankle boots on as well as the cold weather.  She will be transition out of the boot soon and plans to be more active.  Patient also counseled on cutting down foods high in sugar and carbs as well as fatty foods.  Patient agreeable to initiating Ozempic to help with weight loss and diabetes. Body mass index is 43.18 kg/m. Filed Weights   02/07/22 1308  Weight: 284 lb (128.8 kg)   Plan: -Start Ozempic 0.25 mg weekly for 4 weeks, then 0.5 mg weekly -Referral to [place].E.P -Lifestyle modifications with dietary changes and exercise

## 2022-02-08 NOTE — Assessment & Plan Note (Signed)
DEXA scan was ordered last year however patient has not had this done. Vitamin D levels at 56.2. -Will order DEXA scan -Continue vitamin D 2000 units daily -Continue multivitamins

## 2022-02-08 NOTE — Assessment & Plan Note (Addendum)
>>  ASSESSMENT AND PLAN FOR IRON DEFICIENCY ANEMIA WRITTEN ON 02/08/2022  4:43 PM BY Anhthu Perdew, Flossie Buffy, MD  Patient with history of iron deficiency anemia dating back 12 years ago.  Patient has been on iron supplementation with no recent iron studies on file. Iron studies today shows iron of 30, iron sat 11%, ferritin 42. Patient still iron deficient but CBC stable at 11.9. Last colonoscopy in 2019 was normal. Patient denies any GI bleed or any other signs of bleeding.  Plan: Change ferrous sulfate 325 mg to every other day with breakfast for better absorption -Repeat iron studies in 3 to 6 months  >>ASSESSMENT AND PLAN FOR NORMOCYTIC ANEMIA WRITTEN ON 02/08/2022  4:46 PM BY Jerilyn Gillaspie M, MD  Hemoglobin stable at 11.9.  Vitamin B12 >2000. Iron level 30, iron sat 11% and ferritin 42. -Continue iron supplementation -Discontinue vitamin B-12

## 2022-02-08 NOTE — Assessment & Plan Note (Addendum)
Hemoglobin stable at 11.9.  Vitamin B12 >2000. Iron level 30, iron sat 11% and ferritin 42. -Continue iron supplementation -Discontinue vitamin B-12

## 2022-02-08 NOTE — Assessment & Plan Note (Signed)
Kidney function stable with improvement in creatinine from 1.57 9 months ago to 1.40. No electrolyte abnormalities. -Continue lisinopril 20 mg daily

## 2022-02-08 NOTE — Assessment & Plan Note (Signed)
Patient's BP stable on current regimen.  BMP today shows stable kidney function and no electrolytes abnormalities. Vitals:   02/07/22 1308 02/07/22 1356  BP: (!) 140/59 127/63  -Continue lisinopril 20 mg daily

## 2022-02-08 NOTE — Assessment & Plan Note (Addendum)
Patient has had multiple foot infections due to her diabetes requiring multiple surgeries on both foot.  Last surgery in April 2023 after she was found to have an abscess at her right posterior medial ankle.  She follows with [facility] and has been in a Web designer since the I&D. Her right ankle has healed appropriately but patient continues to have trouble with her mobility due to pain. She is currently using a regular walker but interested in rolling walker with wheels and a seat so she can sit down to rest occasionally to take pressure off her feet.  Bilateral foot warm to touch with mild edema of the right foot up to the ankle as well as diminished PT and DP pulses in both foot.  Plan: -ABI at next office visit -Order for DME wheeled rolling walker with a seat -Completed form for diabetic shoes -Follow-up with [facility] as needed

## 2022-02-08 NOTE — Assessment & Plan Note (Signed)
Patient reports she has acid reflux and continues to have occasional reflux symptoms when she eats certain foods.  Patient counseled on avoiding acidic foods such as tomatoes and propping her head up when she sleeps. -Continue pantoprazole 20 mg twice daily. -Follow-up in 4 weeks.

## 2022-02-14 ENCOUNTER — Telehealth: Payer: Self-pay

## 2022-02-14 NOTE — Telephone Encounter (Signed)
Pt is requesting a call back she stated that she was seen in the office on 1/22 by Dr Coy Saunas  who signed off on her orders and paperwork for her diabetic shoes the paperwork was given to [place] same person who roomed her .Marland Kitchen She stated that the office where the paper work was to be sent stated that they never received the paperwork

## 2022-02-14 NOTE — Progress Notes (Signed)
Patient's creatinine has improve, vitamin D is within normal limits, vitamin B12 significantly elevated, still iron deficient with iron saturation of 11% and ferritin of 42, CBC normal with hemoglobin of 11.9.  Discussed results with patient, advised her to discontinue her vitamin B12, continue vitamin D and take her iron supplements every other day.  She is scheduled to follow-up in 4 weeks.

## 2022-02-14 NOTE — Telephone Encounter (Signed)
CMA unable to locate paperwork.  Call made to  [facility] to fax order to [phone]. Attempted to contact pt with update, no answer, message left on recorder for return call.

## 2022-02-16 NOTE — Progress Notes (Signed)
Internal Medicine Clinic Attending  Case discussed with Dr. Amponsah  At the time of the visit.  We reviewed the resident's history and exam and pertinent patient test results.  I agree with the assessment, diagnosis, and plan of care documented in the resident's note.  

## 2022-03-04 DIAGNOSIS — M14671 Charcot's joint, right ankle and foot: Secondary | ICD-10-CM | POA: Diagnosis not present

## 2022-03-04 DIAGNOSIS — E1140 Type 2 diabetes mellitus with diabetic neuropathy, unspecified: Secondary | ICD-10-CM | POA: Diagnosis not present

## 2022-03-04 DIAGNOSIS — M14672 Charcot's joint, left ankle and foot: Secondary | ICD-10-CM | POA: Diagnosis not present

## 2022-03-07 ENCOUNTER — Ambulatory Visit (INDEPENDENT_AMBULATORY_CARE_PROVIDER_SITE_OTHER): Payer: 59 | Admitting: Student

## 2022-03-07 VITALS — BP 129/68 | HR 67 | Temp 97.8°F | Wt 282.1 lb

## 2022-03-07 DIAGNOSIS — Z7985 Long-term (current) use of injectable non-insulin antidiabetic drugs: Secondary | ICD-10-CM

## 2022-03-07 DIAGNOSIS — E11621 Type 2 diabetes mellitus with foot ulcer: Secondary | ICD-10-CM | POA: Diagnosis not present

## 2022-03-07 DIAGNOSIS — L97529 Non-pressure chronic ulcer of other part of left foot with unspecified severity: Secondary | ICD-10-CM

## 2022-03-07 DIAGNOSIS — M869 Osteomyelitis, unspecified: Secondary | ICD-10-CM | POA: Diagnosis not present

## 2022-03-07 DIAGNOSIS — E1169 Type 2 diabetes mellitus with other specified complication: Secondary | ICD-10-CM | POA: Diagnosis not present

## 2022-03-07 DIAGNOSIS — L97519 Non-pressure chronic ulcer of other part of right foot with unspecified severity: Secondary | ICD-10-CM

## 2022-03-07 MED ORDER — METFORMIN HCL ER 500 MG PO TB24: 500.0000 mg | ORAL_TABLET | Freq: Two times a day (BID) | ORAL | 3 refills | Status: DC

## 2022-03-07 MED ORDER — ATORVASTATIN CALCIUM 40 MG PO TABS: 40.0000 mg | ORAL_TABLET | Freq: Every day | ORAL | 3 refills | Status: DC

## 2022-03-07 NOTE — Patient Instructions (Addendum)
Thank you so much for coming to the clinic today!   Today, we are changing your janument to just metformin. So you'll be taking the ozempic once a week, and metformin twice a day with meals. Please stop taking the Janumet. We are also going to check how well the blood is flowing in your legs, and I will let you know the results.   If you have any questions please feel free to the call the clinic at anytime at [phone]. It was a pleasure seeing you!  Best, Dr. Sanjuana Mae

## 2022-03-08 NOTE — Assessment & Plan Note (Signed)
Patient has had multiple foot infections due to diabetes requiring multiple surgeries on both feet.  On last visit, bilateral feet are warm to touch with mild edema, and there were diminished PT and DP pulses in both feet.  She follows with [facility] and recently had a appointment with them, and they are very happy with her progress.  On my exam today, distal pulses were palpable and PT and DP bilaterally, and there is no signs of edema.  Was able to obtain ABIs, which were normal bilaterally.  She is currently in the process of obtaining her diabetic shoes, as well as obtaining wheeled rolling walker with a seat.

## 2022-03-08 NOTE — Assessment & Plan Note (Signed)
Patient presents as a follow-up for the initiation of Ozempic 4 weeks ago.  She states she is tolerating it well, and is working hard on getting involved with the prep program as well as doing her own exercises and controlling her diet.  Besides the Ozempic she takes Janumet 50-500 twice daily.  Her last A1c was 7.3% on February 07, 2022.  Encourage patient to continue diet and exercise regimen, and believe she can go up on her Ozempic from 0.25 to .5 mg weekly.  Plan: - Ozempic 0.5 mg weekly - Discontinue Janumet, and prescribe metformin extended release 500 mg twice daily - Continue encourage weight loss and exercise

## 2022-03-08 NOTE — Progress Notes (Signed)
CC: Diabetes follow-up  HPI:  [name] is a 67 y.o. female living with a history stated below and presents today for diabetes follow-up. Please see problem based assessment and plan for additional details.  Past Medical History:  Diagnosis Date   Arthritis    Chronic normocytic anemia    CKD (chronic kidney disease) stage 3, GFR 30-59 ml/min ([place]) 02/02/2013   Diabetes mellitus type II, controlled ([place]) 06/17/2009   Diabetic ulcer of both feet ([place]) 01/07/2010   Qualifier: Diagnosis of  By: Ina Homes MD, Amanjot     Essential hypertension 06/17/2009   GERD (gastroesophageal reflux disease)    Glaucoma    H/O hiatal hernia    Hardware complicating wound infection ([place]) 12/28/2020   Hyperlipidemia    MRSA (methicillin resistant Staphylococcus aureus)    Obstructive sleep apnea 05/26/2010   Polymicrobial bacterial infection 12/28/2020    Current Outpatient Medications on File Prior to Visit  Medication Sig Dispense Refill   Accu-Chek Softclix Lancets lancets USE ONE LANCET TO TEST THREE TIMES A DAY 100 each 12   acetaminophen (TYLENOL) 500 MG tablet Take 500-1,000 mg by mouth every 6 (six) hours as needed for moderate pain or headache.     Ascorbic Acid (VITAMIN C) 1000 MG tablet Take 1,000 mg by mouth daily. Rose hips     aspirin EC 81 MG tablet Take 81 mg by mouth daily. Swallow whole.     Biotin 5000 MCG CAPS Take 5,000 mcg by mouth daily.     Blood Glucose Monitoring Suppl (ACCU-CHEK AVIVA PLUS) w/Device KIT Use to monitor blood sugars 1 kit 0   Blood Pressure Monitoring (ADULT BLOOD PRESSURE CUFF LG) KIT Use to monitor your BP daily and bring log during each visit 1 kit 0   brimonidine-timolol (COMBIGAN) 0.2-0.5 % ophthalmic solution Place 1 drop into both eyes every 12 (twelve) hours. Place one drop to both eyes twice daily for glaucoma (Patient taking differently: Place 1 drop into both eyes 2 (two) times daily. 8 am and 1700) 15 mL 0   Cholecalciferol (VITAMIN D) 50  MCG (2000 UT) tablet Take 2,000 Units by mouth daily.     cycloSPORINE (RESTASIS) 0.05 % ophthalmic emulsion Place 1 drop into both eyes 2 (two) times daily. 8 am and 1700     Emollient (BAG BALM) OINT Apply to feet twice daily 240 g 5   ferrous sulfate 325 (65 FE) MG tablet Take 1 tablet every other day with breakfast 90 tablet 1   glucose blood (ACCU-CHEK GUIDE) test strip USE ONE STRIP TO TEST THREE TIMES A DAY 100 strip 2   lisinopril (ZESTRIL) 20 MG tablet Take 1 tablet (20 mg total) by mouth daily. 90 tablet 3   Multiple Vitamin (MULTIVITAMIN) tablet Take 1 tablet by mouth daily. 50 plus     Netarsudil-Latanoprost (ROCKLATAN) 0.02-0.005 % SOLN Place 1 drop into both eyes daily at 10 pm.     pantoprazole (PROTONIX) 20 MG tablet Take 1 tablet (20 mg total) by mouth 2 (two) times daily. 180 tablet 2   Polyvinyl Alcohol-Povidone (REFRESH OP) Place 1 drop into both eyes daily as needed (dry eyes).     Semaglutide,0.25 or 0.5MG/DOS, 2 MG/3ML SOPN Inject 0.25 mg once weekly for 4 weeks then 0.5 mg weekly injections 3 mL 1   [DISCONTINUED] pravastatin (PRAVACHOL) 20 MG tablet Take 1 tablet (20 mg total) by mouth daily. 30 tablet 6   No current facility-administered medications on file prior to visit.  Family History  Problem Relation Age of Onset   Diabetes Mother    Hypertension Mother    Dementia Mother    Heart disease Father    Stroke Father    Diabetes Father    Hypertension Father    Gout Father    Deep vein thrombosis Father    Breast cancer Cousin    Breast cancer Cousin     Social History   Socioeconomic History   Marital status: Single    Spouse name: Not on file   Number of children: Not on file   Years of education: Not on file   Highest education level: Not on file  Occupational History   Occupation: Disabled  Tobacco Use   Smoking status: Former    Packs/day: 0.50    Years: 0.00    Total pack years: 0.00    Types: Cigarettes    Quit date: 12/22/1972     Years since quitting: 49.2   Smokeless tobacco: Never  Vaping Use   Vaping Use: Never used  Substance and Sexual Activity   Alcohol use: No   Drug use: No   Sexual activity: Not Currently  Other Topics Concern   Not on file  Social History Narrative   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   Smoked when she was a teenager   no alcohol   no Drugs      Current Social History 10/26/2018        Patient lives on the main level of a 3 floor boarding house with 3 other tenants. There are 3 steps with handrails up to the entrance the patient uses.       Patient's method of transportation is personal car.      The highest level of education was Bachelor's Degree.      The patient currently disabled.      Identified important Relationships are "My cousins, Jonette Eva and Ilean Skill, and my dear friend Dimas Chyle."      Pets : None       Interests / Fun: "I love to read, write, photography: go around [place] taking photos of the [place], [employer]Art, Art Museums, cooking, hanging out with friends, several ministries with my [place], especially working with the youth."      Current Stressors: "Worrying about someone to care for me if anything happens to me. 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[place]atte, BSN, RN-BC          Social Determinants of Health   Financial Resource Strain: Low Risk  (11/08/2021)   Overall Financial Resource Strain (CARDIA)    Difficulty of Paying Living Expenses: Not hard at all  Food Insecurity: No Food Insecurity (11/08/2021)   Hunger Vital Sign    Worried About Running Out of Food in the Last Year: Never true    Ran Out of Food in the Last Year: Never true  Transportation Needs: No Transportation Needs (11/08/2021)   PRAPARE - Hydrologist (Medical): No    Lack of Transportation (Non-Medical): No   Physical Activity: Insufficiently Active (11/08/2021)   Exercise Vital Sign    Days of Exercise per Week: 3 days    Minutes of Exercise per Session: 30 min  Stress: No Stress Concern Present (11/08/2021)   [place] -  Occupational Stress Questionnaire    Feeling of Stress : Not at all  Social Connections: Moderately Integrated (11/08/2021)   Social Connection and Isolation Panel [NHANES]    Frequency of Communication with Friends and Family: More than three times a week    Frequency of Social Gatherings with Friends and Family: Three times a week    Attends Religious Services: More than 4 times per year    Active Member of Clubs or Organizations: Yes    Attends Archivist Meetings: More than 4 times per year    Marital Status: Never married  Intimate Partner Violence: Not At Risk (11/08/2021)   Humiliation, Afraid, Rape, and Kick questionnaire    Fear of Current or Ex-Partner: No    Emotionally Abused: No    Physically Abused: No    Sexually Abused: No    Review of Systems: ROS negative except for what is noted on the assessment and plan.  Vitals:   03/07/22 1311 03/07/22 1355  BP: (!) 147/61 129/68  Pulse: 78 67  Temp: 97.8 F (36.6 C)   TempSrc: Oral   SpO2: 100%   Weight: 282 lb 1.6 oz (128 kg)     Physical Exam: Constitutional: well-appearing female,  Cardiovascular: regular rate and rhythm, no m/r/g, DP and PT pulses felt bilaterally +2 Pulmonary/Chest: normal work of breathing on room air, lungs clear to auscultation bilaterally Extremities: No signs of ulcerations, well-healing wound on right foot MSK: normal bulk and tone   Assessment & Plan:   Diabetes mellitus type II, controlled ([place]) Patient presents as a follow-up for the initiation of Ozempic 4 weeks ago.  She states she is tolerating it well, and is working hard on getting involved with the prep program as well as doing her own exercises and controlling her  diet.  Besides the Ozempic she takes Janumet 50-500 twice daily.  Her last A1c was 7.3% on February 07, 2022.  Encourage patient to continue diet and exercise regimen, and believe she can go up on her Ozempic from 0.25 to .5 mg weekly.  Plan: - Ozempic 0.5 mg weekly - Discontinue Janumet, and prescribe metformin extended release 500 mg twice daily - Continue encourage weight loss and exercise  Diabetic osteomyelitis ([place]) Patient has had multiple foot infections due to diabetes requiring multiple surgeries on both feet.  On last visit, bilateral feet are warm to touch with mild edema, and there were diminished PT and DP pulses in both feet.  She follows with [facility] and recently had a appointment with them, and they are very happy with her progress.  On my exam today, distal pulses were palpable and PT and DP bilaterally, and there is no signs of edema.  Was able to obtain ABIs, which were normal bilaterally.  She is currently in the process of obtaining her diabetic shoes, as well as obtaining wheeled rolling walker with a seat.  Patient discussed with Dr. Thomasene Ripple, M.D. [facility], PGY-1 Phone: [phone] Date 03/08/2022 Time 1:47 PM

## 2022-03-09 ENCOUNTER — Ambulatory Visit (INDEPENDENT_AMBULATORY_CARE_PROVIDER_SITE_OTHER): Payer: 59 | Admitting: Dietician

## 2022-03-09 ENCOUNTER — Other Ambulatory Visit: Payer: Self-pay | Admitting: Dietician

## 2022-03-09 DIAGNOSIS — E1169 Type 2 diabetes mellitus with other specified complication: Secondary | ICD-10-CM

## 2022-03-09 DIAGNOSIS — Z7985 Long-term (current) use of injectable non-insulin antidiabetic drugs: Secondary | ICD-10-CM

## 2022-03-09 DIAGNOSIS — Z7984 Long term (current) use of oral hypoglycemic drugs: Secondary | ICD-10-CM | POA: Diagnosis not present

## 2022-03-09 NOTE — Patient Instructions (Addendum)
Call [facility] about Exercise Program: [phone]  Sodium per day should be no more than 2000 mg, you can break that down per meal to about 650 mg sodium for a whole meal   Looking at one food that is only part of your meal, it should be < 200 mg sodium per serving  ADDED Sugar per day: recommended is 5- 6 teaspoons for women, this is 20-24 grams added sugar per day.  Fruit - better to eat fruit rather than drink juice- recommend 3 small servings per day spread out over the day. It all raises your blood sugar  Ideas for foods: Fish- like salmon, maybe next avocado  When eating out- fajitas, stir fry (limit rice), salads  Adonys Wildes [phone]

## 2022-03-09 NOTE — Progress Notes (Unsigned)
Referral request per patient.

## 2022-03-09 NOTE — Progress Notes (Signed)
Medical Nutrition Therapy:  Appt start time: [id number] end time:  14015 Total time: 9 Visit # 1  Assessment:  Primary concerns today: meal planning for diabetes, weight and health.  "I've done it before, I can do it again" she states I n regards to making dietary change to help her eat healthier. Ms. Nurnberg states she recently lost two loved ones and used foods to comfort herself. This resulted in a 36# weight gain in the past year and a higher A1C. She is seeking assistance along with the semaglutide to help her get back on track. She is beginning to move more and wants to know about added sugars, how much sodium she should be eating and general ideas to help her make healthier choices.  Preferred Learning Style: No preference indicated  Learning Readiness: Ready  ANTHROPOMETRICS: Estimated body mass index is 42.89 kg/m as calculated from the following:   Height as of 11/08/21: 5' 8"$  (1.727 m).   Weight as of 03/07/22: 282 lb 1.6 oz (128 kg).\ WEIGHT HISTORY:  Wt Readings from Last 20 Encounters:  03/07/22 282 lb 1.6 oz (128 kg)  02/07/22 284 lb (128.8 kg)  11/08/21 266 lb 8 oz (120.9 kg)  11/08/21 266 lb 8 oz (120.9 kg)  05/06/21 252 lb 10.4 oz (114.6 kg)  04/22/21 252 lb 6.4 oz (114.5 kg)  03/18/21 250 lb 6.4 oz (113.6 kg)  03/04/21 246 lb (111.6 kg)  02/18/21 246 lb 12.8 oz (111.9 kg)  01/21/21 246 lb 6.4 oz (111.8 kg)  12/28/20 252 lb (114.3 kg)  12/24/20 246 lb 3.2 oz (111.7 kg)  12/17/20 265 lb (120.2 kg)  05/14/20 247 lb (112 kg)  03/11/20 247 lb 6.4 oz (112.2 kg)  12/17/19 255 lb 1.6 oz (115.7 kg)  09/16/19 243 lb 11.2 oz (110.5 kg)  08/20/19 249 lb 6.4 oz (113.1 kg)  07/24/19 252 lb (114.3 kg)  06/12/19 246 lb (111.6 kg)    SLEEP:upright 11- 5-6 AM with CPAP  MEDICATIONS: tolerating the semaglutide and metformin BLOOD SUGAR:checks once daily and did not bring her meter today, we discussed maybe bringing it next time, she reports it was 112 this am. DIETARY  INTAKE: Usual eating pattern includes 3 meals and 1-2 snacks per day. Everyday foods include likes salads and fruit and vegetables, almond milk.   No GI issues reported today Dining Out (times/week): 2 time a month 24-hr recall:  B ( AM): coffee x1 c with good amount hazelnut creamer , peanut butter and banana sandwich on Honey wheat, cuttie clementine x1-2 Snk ( AM): Boost for dm x2 per week  L ( PM): rotisserie chicken, broccoli or Brussels sprouts in light cheese sauce, avocado Snk ( PM): she did not mention a snack here D ( PM): a [employer] salad sometimes with meat, cheese or cranberries, croutons,  small amount of dressing, crackers x 4-5 townhouse OR chicken and vegetables Snk ( PM): no sugar added applesauce or sugar free gelatin Beverages: water, coffee, juice- no sugar added cranberry and pineapple, occasionally ginger tea or ginger ale  Usual physical activity: she states she has been less active trying to heal her foot for the past year, but has been given the okay to increase her activity by hr foot doctor   Progress Towards Goal(s):  In progress.   Nutritional Diagnosis:  NB-1.1 Food and nutrition-related knowledge deficit As related to lack of sufficient prior meal planning for diabetes .  As evidenced by her report. .    Intervention:  Nutrition education of label reading, sodium, added sugar, protein and general ideas about how to reduce calories and increase nutrient density. . Action Goal: increase fiber, plant protein, moderate carbs  Outcome goal: improved knowledge about diabetes meal planning Coordination of care: request electronic referral  Teaching Method Utilized: Visual, Auditory,Hands on Handouts given during visit include:yes Barriers to learning/adherence to lifestyle change: competing values Demonstrated degree of understanding via:  Teach Back   Monitoring/Evaluation:  Dietary intake, exercise, meter , and body weight in 4 week(s) per patient Debera Lat, RD 03/09/2022 3:08 PM. .

## 2022-03-11 ENCOUNTER — Telehealth: Payer: Self-pay

## 2022-03-11 NOTE — Progress Notes (Signed)
Internal Medicine Clinic Attending  Case discussed with the resident at the time of the visit.  We reviewed the resident's history and exam and pertinent patient test results.  I agree with the assessment, diagnosis, and plan of care documented in the resident's note.  

## 2022-03-11 NOTE — Telephone Encounter (Signed)
Call to pt reference referral to [facility] program to pt. Can do [facility] Location starting on 04/05/22 230p-345p Will call her closer to start of class for intake Given my number for contact.

## 2022-03-16 DIAGNOSIS — H401132 Primary open-angle glaucoma, bilateral, moderate stage: Secondary | ICD-10-CM | POA: Diagnosis not present

## 2022-03-26 ENCOUNTER — Telehealth: Payer: Self-pay

## 2022-03-26 NOTE — Telephone Encounter (Signed)
Call to pt reference starting PREP 3/19. Scheduled intake for 03/29/22 at 345p. Will meet pt in lobby.

## 2022-03-29 NOTE — Progress Notes (Signed)
[facility] PREP Evaluation  Patient Details  Name: [name] MRN: [id number] Date of Birth: 06-Aug-1955 Age: 68 y.o. PCP: Leigh Aurora, DO  Vitals:   03/29/22 1615  BP: 130/70  Pulse: 80  SpO2: 98%  Weight: 270 lb 12.8 oz (122.8 kg)     [facility] Eval - 03/29/22 1600       [facility] "PREP" Location   [facility] "PREP" Location [facility]      Referral    Referring Provider Jimmye Norman    Reason for referral Hypertension;Obesitity/Overweight;Diabetes    Program Start Date 04/05/22   T/TH 230p-345p x 12wks     Measurement   Waist Circumference 55.5 inches    Hip Circumference 62.5 inches    Body fat 48.4 percent      Information for Trainer   Goals be more mobile, be more healthy    Current Exercise walks infreq    Orthopedic Concerns right foot booted, some back pain from uneven hips    Pertinent Medical History HTN, OSA, DM2, CKD3    Current Barriers none    Restrictions/Precautions Diabetic snack before exercise    Medications that affect exercise Medication causing dizziness/drowsiness      Timed Up and Go (TUGS)   Timed Up and Go Moderate risk 10-12 seconds      Mobility and Daily Activities   I find it easy to walk up or down two or more flights of stairs. 1    I have no trouble taking out the trash. 4    I do housework such as vacuuming and dusting on my own without difficulty. 4    I can easily lift a gallon of milk (8lbs). 4    I can easily walk a mile. 1    I have no trouble reaching into high cupboards or reaching down to pick up something from the floor. 4    I do not have trouble doing out-door work such as Armed forces logistics/support/administrative officer, raking leaves, or gardening. 1      Mobility and Daily Activities   I feel younger than my age. 3    I feel independent. 4    I feel energetic. 3    I live an active life.  4    I feel strong. 3    I feel healthy. 3    I feel active as other people my age. 3      How fit and strong are you.   Fit and Strong Total Score 42             Past Medical History:  Diagnosis Date   Arthritis    Chronic normocytic anemia    CKD (chronic kidney disease) stage 3, GFR 30-59 ml/min ([place]) 02/02/2013   Diabetes mellitus type II, controlled ([place]) 06/17/2009   Diabetic ulcer of both feet ([place]) 01/07/2010   Qualifier: Diagnosis of  By: Ina Homes MD, Amanjot     Essential hypertension 06/17/2009   GERD (gastroesophageal reflux disease)    Glaucoma    H/O hiatal hernia    Hardware complicating wound infection ([place]) 12/28/2020   Hyperlipidemia    MRSA (methicillin resistant Staphylococcus aureus)    Obstructive sleep apnea 05/26/2010   Polymicrobial bacterial infection 12/28/2020   Past Surgical History:  Procedure Laterality Date   ACHILLES TENDON LENGTHENING Left 11/06/2014   Procedure: LEFT ACHILLES TENDON LENGTHENING, ;  Surgeon: [name] Simmer, MD;  Location: [place];  Service: Orthopedics;  Laterality: Left;   APPENDECTOMY  1975  FOOT ARTHRODESIS Left 11/06/2014   Procedure: TALONAVICULAR CANCELLOUS CUBOID ARTHRODESIS WITH CORRECTIVE OSTEOTOMY ;  Surgeon: [name] Simmer, MD;  Location: [place];  Service: Orthopedics;  Laterality: Left;   FOOT ARTHRODESIS Right 05/14/2020   Procedure: Right heelcord lengthening, subtalar and talonavicular arthrodesis, talus exostectomy, talus corrective osteotomy;  Surgeon: [name] Simmer, MD;  Location: [place];  Service: Orthopedics;  Laterality: Right;   HARDWARE REMOVAL Left 02/19/2015   Procedure: REMOVAL OF LEFT FOOT EXTERNAL FIXATOR, CASTING OF LEFT ANKLE ;  Surgeon: [name] Simmer, MD;  Location: [place];  Service: Orthopedics;  Laterality: Left;   HARDWARE REMOVAL Right 12/17/2020   Procedure: Removal of deep implant right foot;  Surgeon: [name] Simmer, MD;  Location: [place];  Service: Orthopedics;  Laterality: Right;   I & D EXTREMITY Left 2011   "ulcer on my foot got infected" (01/30/2013)   I & D EXTREMITY Right 05/06/2021   Procedure: Irrigation and excisional debridement of right ankle  abcess;  Surgeon: [name] Simmer, MD;  Location: [place];  Service: Orthopedics;  Laterality: Right;  15mn   MULTIPLE TOOTH EXTRACTIONS Bilateral    OOPHORECTOMY Bilateral 1975-1976   Social History   Tobacco Use  Smoking Status Former   Packs/day: 0.50   Years: 0.00   Total pack years: 0.00   Types: Cigarettes   Quit date: 12/22/1972   Years since quitting: 49.2  Smokeless Tobacco Never   Having some constipation with ozempic Encouraged fruits and veggies and water. If it continues, notify MD.   PBarnett Hatter3/12/2022, 4:21 PM

## 2022-03-30 ENCOUNTER — Telehealth: Payer: Self-pay

## 2022-03-30 NOTE — Telephone Encounter (Signed)
Pt is requesting a call back .Marland Kitchen She stated that an order was placed for a walker ( Rolling )  she stated that at her last OV she was measured  for the walker but she has heard nothing back since .Marland Kitchen

## 2022-04-01 ENCOUNTER — Telehealth: Payer: Self-pay | Admitting: Student

## 2022-04-01 DIAGNOSIS — E11610 Type 2 diabetes mellitus with diabetic neuropathic arthropathy: Secondary | ICD-10-CM

## 2022-04-01 NOTE — Telephone Encounter (Signed)
What's her need for this? Thanks

## 2022-04-01 NOTE — Telephone Encounter (Signed)
Patient called back requesting a DME Referral be placed for her [facility] Walker.  An order was placed instead of a Referral.    Please place a DME Referral instead of an order for this patient (for tracking Purposes), as she states she really needs it as soon as possible.

## 2022-04-06 ENCOUNTER — Ambulatory Visit: Payer: Self-pay | Admitting: Dietician

## 2022-04-07 NOTE — Progress Notes (Signed)
[facility] PREP Weekly Session  Patient Details  Name: [name] MRN: [id number] Date of Birth: March 12, 1955 Age: 67 y.o. PCP: Leigh Aurora, DO  There were no vitals filed for this visit.   [facility] Weekly seesion - 04/07/22 1500       [facility] "PREP" Location   [facility] "PREP" Location [facility]      Weekly Session   Topic Discussed Goal setting and welcome to the program   Fit testing, stretch   Classes attended to date 2             Barnett Hatter 04/07/2022, 3:53 PM

## 2022-04-11 DIAGNOSIS — E1141 Type 2 diabetes mellitus with diabetic mononeuropathy: Secondary | ICD-10-CM | POA: Diagnosis not present

## 2022-04-12 NOTE — Progress Notes (Signed)
[facility] PREP Weekly Session  Patient Details  Name: [name] MRN: [id number] Date of Birth: 10/11/1955 Age: 67 y.o. PCP: Leigh Aurora, DO  Vitals:   04/12/22 1430  Weight: 271 lb (122.9 kg)     [facility] Weekly seesion - 04/12/22 1600       [facility] "PREP" Location   [facility] "PREP" Location [facility]      Weekly Session   Topic Discussed Importance of resistance training;Other ways to be active    Minutes exercised this week 120 minutes    Classes attended to date [place] 04/12/2022, 4:44 PM

## 2022-04-17 ENCOUNTER — Other Ambulatory Visit: Payer: Self-pay | Admitting: Student

## 2022-04-17 DIAGNOSIS — E669 Obesity, unspecified: Secondary | ICD-10-CM

## 2022-04-17 DIAGNOSIS — E1169 Type 2 diabetes mellitus with other specified complication: Secondary | ICD-10-CM

## 2022-04-20 ENCOUNTER — Ambulatory Visit (INDEPENDENT_AMBULATORY_CARE_PROVIDER_SITE_OTHER): Payer: 59 | Admitting: Dietician

## 2022-04-20 ENCOUNTER — Encounter: Payer: Self-pay | Admitting: Dietician

## 2022-04-20 VITALS — Wt 271.3 lb

## 2022-04-20 DIAGNOSIS — E1169 Type 2 diabetes mellitus with other specified complication: Secondary | ICD-10-CM | POA: Diagnosis not present

## 2022-04-20 DIAGNOSIS — Z7984 Long term (current) use of oral hypoglycemic drugs: Secondary | ICD-10-CM

## 2022-04-20 NOTE — Patient Instructions (Addendum)
Great job lowering your blood sugar!!!  Break vitamin C in half to take 500 mg daily.   For increasing fiber and constipation:  o Drink 64 fl oz or more of water and other low-calorie (10 calories or less per 8 fl oz) beverages o You can add 1-3 teaspoons per day of Metamucil or Benefiber, 1 teaspoon at a time, to any fluids you choose o Add chia seeds or freshly ground flax seeds to yogurt or oatmeal o Eat some prunes, can have two daily. o Eat as much leafy green vegetables as possible (spinach, kale, collards, turnip and mustard greens); they provided a lot of fiber, and are a good source of magnesium as well. Add kale or spinach to smoothies with frozen berries (more fiber) if you do not like to eat greens. o Other magnesium-rich foods are: legumes (beans and lentils - also great sources of fiber), nuts like almonds and cashews, avocado, yogurt, and milk.  increase intake of vegetables and fruits to 1 or more cups each per day. This will probably take 6+ months.  [name] [phone]

## 2022-04-20 NOTE — Progress Notes (Signed)
Medical Nutrition Therapy:  Appt start time: [id number] end time:1418 Total time:  Visit # 2 Last visit 2.21/24 Assessment:  Primary concerns today: meal planning for diabetes, weight and health.   Patient states she is reading labels, has increased water consumption that helps her with sweet cravings. She is not having juices, just whole fruit.She is more creative in the kitchen during food preparation. She is beginning to make good progress for healthier choices and wants avoiding add sugar food such as creamer in her coffee. Weight loss since last visit: 11#  ANTHROPOMETRICS: Estimated body mass index is 41.25 kg/m as calculated from the following:   Height as of 11/08/21: 5\' 8"  (1.727 m).   Weight as of this encounter: 271 lb 4.8 oz (123.1 kg). Wt Readings from Last 20 Encounters:  04/20/22 271 lb 4.8 oz (123.1 kg)  04/12/22 271 lb (122.9 kg)  03/29/22 270 lb 12.8 oz (122.8 kg)  03/07/22 282 lb 1.6 oz (128 kg)  02/07/22 284 lb (128.8 kg)  11/08/21 266 lb 8 oz (120.9 kg)  11/08/21 266 lb 8 oz (120.9 kg)  05/06/21 252 lb 10.4 oz (114.6 kg)  04/22/21 252 lb 6.4 oz (114.5 kg)  03/18/21 250 lb 6.4 oz (113.6 kg)  03/04/21 246 lb (111.6 kg)  02/18/21 246 lb 12.8 oz (111.9 kg)  01/21/21 246 lb 6.4 oz (111.8 kg)  12/28/20 252 lb (114.3 kg)  12/24/20 246 lb 3.2 oz (111.7 kg)  12/17/20 265 lb (120.2 kg)  05/14/20 247 lb (112 kg)  03/11/20 247 lb 6.4 oz (112.2 kg)  12/17/19 255 lb 1.6 oz (115.7 kg)  09/16/19 243 lb 11.2 oz (110.5 kg)   MEDICATIONS: tolerating the semaglutide (Ozempic) at 0.5 mg and metformin. Vit C 1000 once a day. Multi Vitamin, Vit D 3, and Iron every other day  BLOOD SUGAR:checks once daily and brought her meter today,  METER DOWNLOAD  Report summary is from last 30 days,  Average tests per day: 1 Average blood glucose: 125 Range: minimum: 103 and maximum: 143 % hypoglycemia: 0 Notes about patterns: flat in target pattern throughout day day  . DIETARY  INTAKE: Usual eating pattern includes 3 meals no snacks. Everyday foods include likes salads and fruit and vegetables, almond milk.She is using more spices and seasoning to her meals No GI issues reported today 24-hr recall:  B ( AM): 2 boiled eggs, or chicken rotisserie chicken  or cereal coffee x1 c fruit, water  L ( PM): a [employer] salad, with green peppers, onion, eggs, crackers. water  D ( PM): chicken and two vegetables such as spinach and asparagus. Fruit (apple,orange)   Beverages: water, coffee,  Usual physical activity: she states she has been at the [facility] twice a week (15 minutes machines and 30 minutes in the pool)  Progress Towards Goal(s):  In progress.   Nutritional Diagnosis:  NB-1.1 Food and nutrition-related knowledge deficit As related to lack of sufficient prior meal planning for diabetes improving but still needs additional education/intervention .  As evidenced by her report. .    Intervention:  Nutrition education about protein, carbs and general ideas about how to reduce calories and increase nutrient density. . Action Goal: increase fiber, ways to reduce constipation, moderate carb's, continue with exercise.   Outcome goal: improved knowledge about diabetes meal planning. Coordination of care: requested ozempic refill  Teaching Method Utilized: Visual, Auditory,Hands on Handouts given during visit include:no Barriers to learning/adherence to lifestyle change: competing values Demonstrated degree of understanding via:  Teach Back   Monitoring/Evaluation:  Dietary intake, exercise, meter , and body weight in 6 week(s) per patient Debera Lat, RD 04/20/2022 3:07 PM. .

## 2022-04-22 DIAGNOSIS — E11610 Type 2 diabetes mellitus with diabetic neuropathic arthropathy: Secondary | ICD-10-CM | POA: Diagnosis not present

## 2022-04-25 DIAGNOSIS — H3582 Retinal ischemia: Secondary | ICD-10-CM | POA: Diagnosis not present

## 2022-04-25 DIAGNOSIS — H43813 Vitreous degeneration, bilateral: Secondary | ICD-10-CM | POA: Diagnosis not present

## 2022-04-25 DIAGNOSIS — H35033 Hypertensive retinopathy, bilateral: Secondary | ICD-10-CM | POA: Diagnosis not present

## 2022-04-25 DIAGNOSIS — H31093 Other chorioretinal scars, bilateral: Secondary | ICD-10-CM | POA: Diagnosis not present

## 2022-04-25 DIAGNOSIS — E113593 Type 2 diabetes mellitus with proliferative diabetic retinopathy without macular edema, bilateral: Secondary | ICD-10-CM | POA: Diagnosis not present

## 2022-04-25 LAB — HM DIABETES EYE EXAM

## 2022-04-27 DIAGNOSIS — E1140 Type 2 diabetes mellitus with diabetic neuropathy, unspecified: Secondary | ICD-10-CM | POA: Diagnosis not present

## 2022-04-27 DIAGNOSIS — M2022 Hallux rigidus, left foot: Secondary | ICD-10-CM | POA: Diagnosis not present

## 2022-04-27 DIAGNOSIS — M14672 Charcot's joint, left ankle and foot: Secondary | ICD-10-CM | POA: Diagnosis not present

## 2022-04-27 DIAGNOSIS — M14671 Charcot's joint, right ankle and foot: Secondary | ICD-10-CM | POA: Diagnosis not present

## 2022-05-03 NOTE — Progress Notes (Signed)
[facility] PREP Weekly Session  Patient Details  Name: [name] MRN: [id number] Date of Birth: 05/09/1955 Age: 67 y.o. PCP: Modena Slater, DO  Vitals:   05/03/22 1617  Weight: 263 lb (119.3 kg)     [facility] Weekly seesion - 05/03/22 1600       [facility] "PREP" Location   [facility]      Weekly Session   Topic Discussed Health habits    Minutes exercised this week 170 minutes    Classes attended to date 7             Pam Jerral Bonito 05/03/2022, 4:18 PM

## 2022-05-10 NOTE — Progress Notes (Signed)
[facility] PREP Weekly Session  Patient Details  Name: [name] MRN: [id number] Date of Birth: Sep 29, 1955 Age: 67 y.o. PCP: Modena Slater, DO  Vitals:   05/10/22 1430  Weight: 262 lb (118.8 kg)     [facility] Weekly seesion - 05/10/22 1700       [facility] "PREP" Location   [facility]      Weekly Session   Topic Discussed Restaurant Eating   Salt and sugar demos   Minutes exercised this week 280 minutes    Classes attended to date 39             Pam Jerral Bonito 05/10/2022, 5:02 PM

## 2022-05-17 NOTE — Progress Notes (Signed)
[facility] PREP Weekly Session  Patient Details  Name: [name] MRN: [id number] Date of Birth: [birth year] Age: 67 y.o. PCP: Modena Slater, DO  Vitals:   05/17/22 1604  Weight: 259 lb (117.5 kg)     [facility] Weekly seesion - 05/17/22 1600       [facility] "PREP" Location   [facility]PREP" Location [facility]      Weekly Session   Topic Discussed Stress management and problem solving   relaxation meditation, breathwork   Minutes exercised this week 400 minutes    Classes attended to date 49             Pam Jerral Bonito 05/17/2022, 4:06 PM

## 2022-05-26 NOTE — Progress Notes (Signed)
[facility] PREP Weekly Session  Patient Details  Name: [name] MRN: [id number] Date of Birth: [birth year] Age: 67 y.o. PCP: Modena Slater, DO  Vitals:   05/24/22 1430  Weight: 255 lb (115.7 kg)     [facility] Weekly seesion - 05/26/22 1200       [facility] "PREP" Location   [facility]      Weekly Session   Topic Discussed Expectations and non-scale victories    Minutes exercised this week 120 minutes    Classes attended to date 12             Bonnye Fava 05/26/2022, 12:07 PM

## 2022-05-30 ENCOUNTER — Encounter: Payer: Self-pay | Admitting: Dietician

## 2022-05-30 DIAGNOSIS — H35033 Hypertensive retinopathy, bilateral: Secondary | ICD-10-CM | POA: Diagnosis not present

## 2022-05-30 DIAGNOSIS — H4311 Vitreous hemorrhage, right eye: Secondary | ICD-10-CM | POA: Diagnosis not present

## 2022-05-30 DIAGNOSIS — E113593 Type 2 diabetes mellitus with proliferative diabetic retinopathy without macular edema, bilateral: Secondary | ICD-10-CM | POA: Diagnosis not present

## 2022-05-30 DIAGNOSIS — H43813 Vitreous degeneration, bilateral: Secondary | ICD-10-CM | POA: Diagnosis not present

## 2022-05-30 DIAGNOSIS — H3582 Retinal ischemia: Secondary | ICD-10-CM | POA: Diagnosis not present

## 2022-05-30 DIAGNOSIS — H31093 Other chorioretinal scars, bilateral: Secondary | ICD-10-CM | POA: Diagnosis not present

## 2022-05-30 LAB — HM DIABETES EYE EXAM

## 2022-06-06 ENCOUNTER — Encounter: Payer: Self-pay | Admitting: Student

## 2022-06-06 ENCOUNTER — Ambulatory Visit (INDEPENDENT_AMBULATORY_CARE_PROVIDER_SITE_OTHER): Payer: 59 | Admitting: Student

## 2022-06-06 VITALS — BP 133/76 | HR 74 | Temp 98.2°F | Ht 68.0 in | Wt 255.3 lb

## 2022-06-06 DIAGNOSIS — Z7984 Long term (current) use of oral hypoglycemic drugs: Secondary | ICD-10-CM | POA: Diagnosis not present

## 2022-06-06 DIAGNOSIS — I10 Essential (primary) hypertension: Secondary | ICD-10-CM

## 2022-06-06 DIAGNOSIS — K219 Gastro-esophageal reflux disease without esophagitis: Secondary | ICD-10-CM | POA: Diagnosis not present

## 2022-06-06 DIAGNOSIS — Z7985 Long-term (current) use of injectable non-insulin antidiabetic drugs: Secondary | ICD-10-CM | POA: Diagnosis not present

## 2022-06-06 DIAGNOSIS — E785 Hyperlipidemia, unspecified: Secondary | ICD-10-CM

## 2022-06-06 DIAGNOSIS — D509 Iron deficiency anemia, unspecified: Secondary | ICD-10-CM | POA: Diagnosis not present

## 2022-06-06 DIAGNOSIS — E1169 Type 2 diabetes mellitus with other specified complication: Secondary | ICD-10-CM

## 2022-06-06 LAB — GLUCOSE, CAPILLARY: Glucose-Capillary: 102 mg/dL — ABNORMAL HIGH (ref 70–99)

## 2022-06-06 LAB — POCT GLYCOSYLATED HEMOGLOBIN (HGB A1C): Hemoglobin A1C: 6 % — AB (ref 4.0–5.6)

## 2022-06-06 MED ORDER — PANTOPRAZOLE SODIUM 20 MG PO TBEC
20.0000 mg | DELAYED_RELEASE_TABLET | Freq: Two times a day (BID) | ORAL | 2 refills | Status: DC
Start: 2022-06-06 — End: 2023-01-16

## 2022-06-06 MED ORDER — FERROUS SULFATE 325 (65 FE) MG PO TABS: ORAL_TABLET | ORAL | 1 refills | Status: AC

## 2022-06-06 NOTE — Assessment & Plan Note (Addendum)
Patient has A1c of 6.0 today.  Patient has been taking Ozempic 0.5 mg weekly as well as metformin 500 mg twice daily.  Her last A1c was 7.3% on February 07, 2022, which has now improved to 6 today.  Patient was very pleased to hear this.  I was very pleased to see this.  Patient states she has made lifestyle changes and has been going to the [facility] twice a week now.  She also reports she has been eating more protein and more vegetables.  On exam, patient does have decreased sensation noted to her bilateral lower extremities, and unable to distinguish between sharp and dull sensation up to the malleoli.  A1c at goal.  Patient states she is not interested in going up on Ozempic for weight loss, and would like to do lifestyle changes.  Plan: -Continue Ozempic 0.5 mg -Continue metformin 500 mg twice daily -Continue lifestyle modification -Lipid panel pending -Plan to request records from ophthalmologist -Return in about 3-6 months

## 2022-06-06 NOTE — Assessment & Plan Note (Signed)
Initially on exam, patient did have elevated blood pressure 146/71.  After patient rested, patient blood pressure was 133/70.  Patient denies any headaches, chest pain, shortness of breath.  She states compliance with medication.  Medication includes lisinopril 20 mg daily.  Plan: -Continue lisinopril 20 mg daily

## 2022-06-06 NOTE — Patient Instructions (Addendum)
[name],Thank you for allowing me to take part in your care today.  Here are your instructions.  1. Regarding your blood pressure, please continue taking your medications as prescribed.   2. Regarding your diabetes, your A1c today was 6.0. You are doing a great job with this. Please keep up the good work and keep taking your medications as prescribed.   3. Regarding your lab work, I am going to check your cholesterol, please await my phone call to get the results.  4. Please call your eye doctors to send their records to [place].   Thank you, Dr. Allena Katz  If you have any other questions please contact the internal medicine clinic at [phone]

## 2022-06-06 NOTE — Assessment & Plan Note (Signed)
Most recent hemoglobin normal at 11.9.  No acute concerns for bleeding.  Plan: -Repeat iron studies at next visit -Continue iron supplementation every other day

## 2022-06-06 NOTE — Progress Notes (Signed)
CC: Diabetes follow up  HPI:  [name] is a 67 y.o. female with a past medical history of hypertension, OSA, type 2 diabetes, CKD stage IIIb who presents for 66-month follow-up.  Please see assessment and plan for full HPI.   Past Medical History:  Diagnosis Date   Arthritis    Chronic normocytic anemia    CKD (chronic kidney disease) stage 3, GFR 30-59 ml/min (HCC) 02/02/2013   Diabetes mellitus type II, controlled (HCC) 06/17/2009   Diabetic ulcer of both feet (HCC) 01/07/2010   Qualifier: Diagnosis of  By: [name] MD, Amanjot     Essential hypertension 06/17/2009   GERD (gastroesophageal reflux disease)    Glaucoma    H/O hiatal hernia    Hardware complicating wound infection (HCC) 12/28/2020   Hyperlipidemia    MRSA (methicillin resistant Staphylococcus aureus)    Obstructive sleep apnea 05/26/2010   Polymicrobial bacterial infection 12/28/2020     Current Outpatient Medications:    Accu-Chek Softclix Lancets lancets, USE ONE LANCET TO TEST THREE TIMES A DAY, Disp: 100 each, Rfl: 12   acetaminophen (TYLENOL) 500 MG tablet, Take 500-1,000 mg by mouth every 6 (six) hours as needed for moderate pain or headache., Disp: , Rfl:    Ascorbic Acid (VITAMIN C) 1000 MG tablet, Take 1,000 mg by mouth daily. Rose hips, Disp: , Rfl:    aspirin EC 81 MG tablet, Take 81 mg by mouth daily. Swallow whole., Disp: , Rfl:    atorvastatin (LIPITOR) 40 MG tablet, Take 1 tablet (40 mg total) by mouth daily., Disp: 90 tablet, Rfl: 3   Biotin 5000 MCG CAPS, Take 5,000 mcg by mouth daily., Disp: , Rfl:    Blood Glucose Monitoring Suppl (ACCU-CHEK AVIVA PLUS) w/Device KIT, Use to monitor blood sugars, Disp: 1 kit, Rfl: 0   Blood Pressure Monitoring (ADULT BLOOD PRESSURE CUFF LG) KIT, Use to monitor your BP daily and bring log during each visit, Disp: 1 kit, Rfl: 0   brimonidine-timolol (COMBIGAN) 0.2-0.5 % ophthalmic solution, Place 1 drop into both eyes every 12 (twelve) hours. Place one  drop to both eyes twice daily for glaucoma (Patient taking differently: Place 1 drop into both eyes 2 (two) times daily. 8 am and 1700), Disp: 15 mL, Rfl: 0   Cholecalciferol (VITAMIN D) 50 MCG (2000 UT) tablet, Take 2,000 Units by mouth daily., Disp: , Rfl:    cycloSPORINE (RESTASIS) 0.05 % ophthalmic emulsion, Place 1 drop into both eyes 2 (two) times daily. 8 am and 1700, Disp: , Rfl:    Emollient (BAG BALM) OINT, Apply to feet twice daily, Disp: 240 g, Rfl: 5   ferrous sulfate 325 (65 FE) MG tablet, Take 1 tablet every other day with breakfast, Disp: 90 tablet, Rfl: 1   glucose blood (ACCU-CHEK GUIDE) test strip, USE ONE STRIP TO TEST THREE TIMES A DAY, Disp: 100 strip, Rfl: 2   lisinopril (ZESTRIL) 20 MG tablet, Take 1 tablet (20 mg total) by mouth daily., Disp: 90 tablet, Rfl: 3   metFORMIN (GLUCOPHAGE-XR) 500 MG 24 hr tablet, Take 1 tablet (500 mg total) by mouth 2 (two) times daily with a meal., Disp: 90 tablet, Rfl: 3   Multiple Vitamin (MULTIVITAMIN) tablet, Take 1 tablet by mouth daily. 50 plus, Disp: , Rfl:    Netarsudil-Latanoprost (ROCKLATAN) 0.02-0.005 % SOLN, Place 1 drop into both eyes daily at 10 pm., Disp: , Rfl:    OZEMPIC, 0.25 OR 0.5 MG/DOSE, 2 MG/3ML SOPN, DIAL AND INJECT UNDER  THE SKIN 0.25 MG WEEKLY FOR 4 WEEKS THEN DIAL AND INJECT 0.5 MG WEEKLY THEREAFTER, Disp: 3 mL, Rfl: 1   pantoprazole (PROTONIX) 20 MG tablet, Take 1 tablet (20 mg total) by mouth 2 (two) times daily., Disp: 180 tablet, Rfl: 2   Polyvinyl Alcohol-Povidone (REFRESH OP), Place 1 drop into both eyes daily as needed (dry eyes)., Disp: , Rfl:   Review of Systems:    Negative except what is stated in the HPI.   Physical Exam:  Vitals:   06/06/22 1413 06/06/22 1451  BP: (!) 146/71 133/76  Pulse: 86 74  Temp: 98.2 F (36.8 C)   TempSrc: Oral   SpO2: 100%   Weight: 255 lb 4.8 oz (115.8 kg)   Height: 5\' 8"  (1.727 m)     General: Patient is sitting comfortably in the room  Head: Normocephalic,  atraumatic  Cardio: Regular rate and rhythm, no murmurs, rubs or gallops. Pulmonary: Clear to ausculation bilaterally with no rales, rhonchi, and crackles  Extremities: Decreased sensation noted to bilateral lower extremities up to the malleoli, as patient is not able to distinguish between sharp and dull sensation up to bilateral malleoli.  2+ pedal pulses appreciated.  No wounds appreciated.   Assessment & Plan:   Essential hypertension Initially on exam, patient did have elevated blood pressure 146/71.  After patient rested, patient blood pressure was 133/70.  Patient denies any headaches, chest pain, shortness of breath.  She states compliance with medication.  Medication includes lisinopril 20 mg daily.  Plan: -Continue lisinopril 20 mg daily  Diabetes mellitus type II, controlled (HCC) Patient has A1c of 6.0 today.  Patient has been taking Ozempic 0.5 mg weekly as well as metformin 500 mg twice daily.  Her last A1c was 7.3% on February 07, 2022, which has now improved to 6 today.  Patient was very pleased to hear this.  I was very pleased to see this.  Patient states she has made lifestyle changes and has been going to the [facility] twice a week now.  She also reports she has been eating more protein and more vegetables.  On exam, patient does have decreased sensation noted to her bilateral lower extremities, and unable to distinguish between sharp and dull sensation up to the malleoli.  A1c at goal.  Patient states she is not interested in going up on Ozempic for weight loss, and would like to do lifestyle changes.  Plan: -Continue Ozempic 0.5 mg -Continue metformin 500 mg twice daily -Continue lifestyle modification -Lipid panel pending -Plan to request records from ophthalmologist -Return in about 3-6 months   Iron deficiency anemia Most recent hemoglobin normal at 11.9.  No acute concerns for bleeding.  Plan: -Repeat iron studies at next visit -Continue iron supplementation every  other day   GERD (gastroesophageal reflux disease) Pantoprazole has worked well for patient.  She has no concerns about this today.  Plan: -Continue pantoprazole 20 mg BID  Hyperlipidemia Plan to update lipid panel today.  Plan: -Continue atorvastatin 40 mg daily -Lipid panel pending   Patient discussed with Dr. Dickie La  Modena Slater, DO PGY-1 Internal Medicine Resident  Pager: [phone]

## 2022-06-06 NOTE — Assessment & Plan Note (Signed)
Plan to update lipid panel today.  Plan: -Continue atorvastatin 40 mg daily -Lipid panel pending   Addendum: -Lipid panel within normal limits, will continue with current therapy

## 2022-06-06 NOTE — Assessment & Plan Note (Addendum)
Pantoprazole has worked well for patient.  She has no concerns about this today.  Plan: -Continue pantoprazole 20 mg BID

## 2022-06-07 LAB — LIPID PANEL
Chol/HDL Ratio: 2.2 ratio (ref 0.0–4.4)
Cholesterol, Total: 82 mg/dL — ABNORMAL LOW (ref 100–199)
HDL: 37 mg/dL — ABNORMAL LOW (ref >39)
LDL Chol Calc (NIH): 31 mg/dL (ref 0–99)
Triglycerides: 62 mg/dL (ref 0–149)
VLDL Cholesterol Cal: 14 mg/dL (ref 5–40)

## 2022-06-09 NOTE — Addendum Note (Signed)
Addended by: Dickie La on: 06/09/2022 02:11 PM   Modules accepted: Level of Service

## 2022-06-09 NOTE — Progress Notes (Signed)
Internal Medicine Clinic Attending  Case discussed with Dr. Patel  At the time of the visit.  We reviewed the resident's history and exam and pertinent patient test results.  I agree with the assessment, diagnosis, and plan of care documented in the resident's note.  

## 2022-06-14 ENCOUNTER — Other Ambulatory Visit: Payer: Self-pay

## 2022-06-14 DIAGNOSIS — E669 Obesity, unspecified: Secondary | ICD-10-CM

## 2022-06-14 DIAGNOSIS — E1169 Type 2 diabetes mellitus with other specified complication: Secondary | ICD-10-CM

## 2022-06-14 MED ORDER — OZEMPIC (0.25 OR 0.5 MG/DOSE) 2 MG/3ML ~~LOC~~ SOPN
0.5000 mg | PEN_INJECTOR | SUBCUTANEOUS | 1 refills | Status: DC
Start: 2022-06-14 — End: 2022-08-08

## 2022-06-21 ENCOUNTER — Telehealth: Payer: Self-pay

## 2022-06-22 NOTE — Telephone Encounter (Signed)
Call from pt requesting call back to discuss update on eyes.  Call to pt. Eyes are doing better. Surgery is planned for Thursday.  Offered for her to come back and repeat PREP when eyes are stable. Reports doing well on holding weight down.  Misses exercising but knows she needs to get her eyes healthy first.

## 2022-06-23 DIAGNOSIS — H31093 Other chorioretinal scars, bilateral: Secondary | ICD-10-CM | POA: Diagnosis not present

## 2022-06-23 DIAGNOSIS — H43813 Vitreous degeneration, bilateral: Secondary | ICD-10-CM | POA: Diagnosis not present

## 2022-06-23 DIAGNOSIS — H3582 Retinal ischemia: Secondary | ICD-10-CM | POA: Diagnosis not present

## 2022-06-23 DIAGNOSIS — E113593 Type 2 diabetes mellitus with proliferative diabetic retinopathy without macular edema, bilateral: Secondary | ICD-10-CM | POA: Diagnosis not present

## 2022-06-23 DIAGNOSIS — H35033 Hypertensive retinopathy, bilateral: Secondary | ICD-10-CM | POA: Diagnosis not present

## 2022-06-23 DIAGNOSIS — H4311 Vitreous hemorrhage, right eye: Secondary | ICD-10-CM | POA: Diagnosis not present

## 2022-06-23 DIAGNOSIS — H43392 Other vitreous opacities, left eye: Secondary | ICD-10-CM | POA: Diagnosis not present

## 2022-06-23 LAB — HM DIABETES EYE EXAM

## 2022-06-29 ENCOUNTER — Other Ambulatory Visit: Payer: Self-pay | Admitting: Student

## 2022-06-29 DIAGNOSIS — E1140 Type 2 diabetes mellitus with diabetic neuropathy, unspecified: Secondary | ICD-10-CM | POA: Diagnosis not present

## 2022-06-29 DIAGNOSIS — M2022 Hallux rigidus, left foot: Secondary | ICD-10-CM | POA: Diagnosis not present

## 2022-06-29 DIAGNOSIS — M14672 Charcot's joint, left ankle and foot: Secondary | ICD-10-CM | POA: Diagnosis not present

## 2022-06-29 DIAGNOSIS — M14671 Charcot's joint, right ankle and foot: Secondary | ICD-10-CM | POA: Diagnosis not present

## 2022-07-15 DIAGNOSIS — E119 Type 2 diabetes mellitus without complications: Secondary | ICD-10-CM | POA: Diagnosis not present

## 2022-07-20 ENCOUNTER — Encounter: Payer: Self-pay | Admitting: Dietician

## 2022-07-28 DIAGNOSIS — E113591 Type 2 diabetes mellitus with proliferative diabetic retinopathy without macular edema, right eye: Secondary | ICD-10-CM | POA: Diagnosis not present

## 2022-07-29 DIAGNOSIS — H401132 Primary open-angle glaucoma, bilateral, moderate stage: Secondary | ICD-10-CM | POA: Diagnosis not present

## 2022-08-05 ENCOUNTER — Ambulatory Visit
Admission: RE | Admit: 2022-08-05 | Discharge: 2022-08-05 | Disposition: A | Discharge status: Home / Self Care | Payer: 59 | Source: Ambulatory Visit | Attending: Internal Medicine | Admitting: Internal Medicine

## 2022-08-05 DIAGNOSIS — E11610 Type 2 diabetes mellitus with diabetic neuropathic arthropathy: Secondary | ICD-10-CM

## 2022-08-05 DIAGNOSIS — M8588 Other specified disorders of bone density and structure, other site: Secondary | ICD-10-CM | POA: Diagnosis not present

## 2022-08-05 DIAGNOSIS — N1832 Chronic kidney disease, stage 3b: Secondary | ICD-10-CM

## 2022-08-05 DIAGNOSIS — E559 Vitamin D deficiency, unspecified: Secondary | ICD-10-CM

## 2022-08-05 DIAGNOSIS — N189 Chronic kidney disease, unspecified: Secondary | ICD-10-CM | POA: Diagnosis not present

## 2022-08-05 DIAGNOSIS — E669 Obesity, unspecified: Secondary | ICD-10-CM

## 2022-08-06 ENCOUNTER — Other Ambulatory Visit: Payer: Self-pay | Admitting: Student

## 2022-08-06 DIAGNOSIS — E1169 Type 2 diabetes mellitus with other specified complication: Secondary | ICD-10-CM

## 2022-08-06 DIAGNOSIS — E669 Obesity, unspecified: Secondary | ICD-10-CM

## 2022-08-09 NOTE — Progress Notes (Signed)
Results consistent with osteopenia. Recommended the patient to start taking Vitamin D supplementation and Calcium supplementation. Let the patient know about the results via phone call.

## 2022-09-15 ENCOUNTER — Other Ambulatory Visit: Payer: Self-pay

## 2022-09-15 ENCOUNTER — Encounter: Payer: Self-pay | Admitting: Student

## 2022-09-15 ENCOUNTER — Ambulatory Visit (INDEPENDENT_AMBULATORY_CARE_PROVIDER_SITE_OTHER): Payer: 59 | Admitting: Student

## 2022-09-15 VITALS — BP 116/63 | HR 88 | Temp 98.1°F | Ht 68.0 in | Wt 232.9 lb

## 2022-09-15 DIAGNOSIS — I10 Essential (primary) hypertension: Secondary | ICD-10-CM

## 2022-09-15 DIAGNOSIS — Z794 Long term (current) use of insulin: Secondary | ICD-10-CM

## 2022-09-15 DIAGNOSIS — E119 Type 2 diabetes mellitus without complications: Secondary | ICD-10-CM | POA: Diagnosis not present

## 2022-09-15 DIAGNOSIS — Z7985 Long-term (current) use of injectable non-insulin antidiabetic drugs: Secondary | ICD-10-CM | POA: Diagnosis not present

## 2022-09-15 DIAGNOSIS — E1121 Type 2 diabetes mellitus with diabetic nephropathy: Secondary | ICD-10-CM

## 2022-09-15 DIAGNOSIS — Z23 Encounter for immunization: Secondary | ICD-10-CM

## 2022-09-15 LAB — POCT GLYCOSYLATED HEMOGLOBIN (HGB A1C): Hemoglobin A1C: 5.3 % (ref 4.0–5.6)

## 2022-09-15 LAB — GLUCOSE, CAPILLARY: Glucose-Capillary: 92 mg/dL (ref 70–99)

## 2022-09-15 NOTE — Assessment & Plan Note (Signed)
Updated Flu vaccine today.

## 2022-09-15 NOTE — Assessment & Plan Note (Signed)
Patient presents for diabetes follow up. She states that she has been doing well with her current regimen of Ozempic 0.5 mg weekly as well as metformin 500 mg twice daily. She denies any concerns today. She states that she is doing well. A1c today is 5.3 which is the best that it has been in the past 1-2 years. Patient was very happy and please to hear this information. Will continue with the current management plan.  Plan: -Continue Ozempic 0.5 mg -Continue metformin 500 mg twice daily -Continue lifestyle modification -Obtain urine microalbumin and creatinine ratio

## 2022-09-15 NOTE — Progress Notes (Signed)
CC: HTN and T2DM follow up  HPI:  [name] is a 67 y.o. 67 year old female with a past medical history of hypertension, GERD, type 2 diabetes, hyperlipidemia presenting for follow-up visit.  Please see assessment and plan for full HPI.   Meds: GERD: Pantoprazole 20 mg twice daily Type 2 diabetes: Ozempic 0.5 mg weekly, metformin 500 mg twice daily Hyperlipidemia: Atorvastatin 40 mg daily, aspirin 81 mg daily Hypertension: Lisinopril 20 mg daily  Past Medical History:  Diagnosis Date   Arthritis    Chronic normocytic anemia    CKD (chronic kidney disease) stage 3, GFR 30-59 ml/min (HCC) 02/02/2013   Diabetes mellitus type II, controlled (HCC) 06/17/2009   Diabetic ulcer of both feet (HCC) 01/07/2010   Qualifier: Diagnosis of  By: [name] MD, Amanjot     Essential hypertension 06/17/2009   GERD (gastroesophageal reflux disease)    Glaucoma    H/O hiatal hernia    Hardware complicating wound infection (HCC) 12/28/2020   Hyperlipidemia    MRSA (methicillin resistant Staphylococcus aureus)    Obstructive sleep apnea 05/26/2010   Polymicrobial bacterial infection 12/28/2020     Current Outpatient Medications:    Accu-Chek Softclix Lancets lancets, USE ONE LANCET TO TEST THREE TIMES A DAY, Disp: 100 each, Rfl: 12   acetaminophen (TYLENOL) 500 MG tablet, Take 500-1,000 mg by mouth every 6 (six) hours as needed for moderate pain or headache., Disp: , Rfl:    Ascorbic Acid (VITAMIN C) 1000 MG tablet, Take 1,000 mg by mouth daily. Rose hips, Disp: , Rfl:    aspirin EC 81 MG tablet, Take 81 mg by mouth daily. Swallow whole., Disp: , Rfl:    atorvastatin (LIPITOR) 40 MG tablet, Take 1 tablet (40 mg total) by mouth daily., Disp: 90 tablet, Rfl: 3   Biotin 5000 MCG CAPS, Take 5,000 mcg by mouth daily., Disp: , Rfl:    Blood Glucose Monitoring Suppl (ACCU-CHEK AVIVA PLUS) w/Device KIT, Use to monitor blood sugars, Disp: 1 kit, Rfl: 0   Blood Pressure Monitoring (ADULT BLOOD  PRESSURE CUFF LG) KIT, Use to monitor your BP daily and bring log during each visit, Disp: 1 kit, Rfl: 0   Cholecalciferol (VITAMIN D) 50 MCG (2000 UT) tablet, Take 2,000 Units by mouth daily., Disp: , Rfl:    cycloSPORINE (RESTASIS) 0.05 % ophthalmic emulsion, Place 1 drop into both eyes 2 (two) times daily. 8 am and 1700, Disp: , Rfl:    Emollient (BAG BALM) OINT, Apply to feet twice daily, Disp: 240 g, Rfl: 5   ferrous sulfate 325 (65 FE) MG tablet, Take 1 tablet every other day with breakfast, Disp: 90 tablet, Rfl: 1   glucose blood (ACCU-CHEK GUIDE) test strip, USE ONE STRIP TO TEST THREE TIMES A DAY, Disp: 100 strip, Rfl: 2   lisinopril (ZESTRIL) 20 MG tablet, Take 1 tablet (20 mg total) by mouth daily., Disp: 90 tablet, Rfl: 3   metFORMIN (GLUCOPHAGE-XR) 500 MG 24 hr tablet, TAKE 1 TABLET BY MOUTH TWICE A DAY WITH A MEAL, Disp: 90 tablet, Rfl: 3   Multiple Vitamin (MULTIVITAMIN) tablet, Take 1 tablet by mouth daily. 50 plus, Disp: , Rfl:    Netarsudil-Latanoprost (ROCKLATAN) 0.02-0.005 % SOLN, Place 1 drop into both eyes daily at 10 pm., Disp: , Rfl:    OZEMPIC, 0.25 OR 0.5 MG/DOSE, 2 MG/3ML SOPN, DIAL AND INJECT UNDER THE SKIN 0.5 MG WEEKLY, Disp: 3 mL, Rfl: 1   pantoprazole (PROTONIX) 20 MG tablet, Take 1 tablet (  20 mg total) by mouth 2 (two) times daily., Disp: 180 tablet, Rfl: 2   Polyvinyl Alcohol-Povidone (REFRESH OP), Place 1 drop into both eyes daily as needed (dry eyes)., Disp: , Rfl:   Review of Systems:    Patient endorses no concerns today   Physical Exam:  Vitals:   09/15/22 1114  BP: 116/63  Pulse: 88  Temp: 98.1 F (36.7 C)  TempSrc: Oral  SpO2: 100%  Weight: 232 lb 14.4 oz (105.6 kg)  Height: 5\' 8"  (1.727 m)    General: Patient is sitting comfortably in the room  Head: Normocephalic, atraumatic  Cardio: Regular rate and rhythm, no murmurs, rubs or gallops Pulmonary: Clear to ausculation bilaterally with no rales, rhonchi, and crackles    Assessment & Plan:    Essential hypertension Patient presents to the clinic for HTN follow up. She reports that she has been on her lisinopril  20 mg daily. She states that she is feeling well and has no chest pains, shortness of breath or vision changes. BP today is 116/63. Well controlled.  Plan: -Continue with lisinopril 20 mg daily.   Diabetes mellitus type II, controlled [facility]) Patient presents for diabetes follow up. She states that she has been doing well with her current regimen of Ozempic 0.5 mg weekly as well as metformin 500 mg twice daily. She denies any concerns today. She states that she is doing well. A1c today is 5.3 which is the best that it has been in the past 1-2 years. Patient was very happy and please to hear this information. Will continue with the current management plan.  Plan: -Continue Ozempic 0.5 mg -Continue metformin 500 mg twice daily -Continue lifestyle modification -Obtain urine microalbumin and creatinine ratio   Flu vaccine need Updated Flu vaccine today.   Patient discussed with Dr. Oren Bracket, DO PGY-2 Internal Medicine Resident  Pager: [phone]

## 2022-09-15 NOTE — Patient Instructions (Signed)
[name],Thank you for allowing me to take part in your care today.  Here are your instructions.  1. Please come back in about 3 months. I will call you with the results for your urine and your A1c.  2. Please call if you have any other concerns.   3. Please call if you have need any refills.   Thank you, Dr. Allena Katz  If you have any other questions please contact the internal medicine clinic at [phone]

## 2022-09-15 NOTE — Assessment & Plan Note (Addendum)
Patient presents to the clinic for HTN follow up. She reports that she has been on her lisinopril  20 mg daily. She states that she is feeling well and has no chest pains, shortness of breath or vision changes. BP today is 116/63. Well controlled.  Plan: -Continue with lisinopril 20 mg daily.

## 2022-09-16 LAB — MICROALBUMIN / CREATININE URINE RATIO
Creatinine, Urine: 100.9 mg/dL
Microalb/Creat Ratio: 22 mg/g creat (ref 0–29)
Microalbumin, Urine: 22.2 ug/mL

## 2022-09-16 NOTE — Progress Notes (Signed)
Internal Medicine Clinic Attending  Case discussed with the resident at the time of the visit.  We reviewed the resident's history and exam and pertinent patient test results.  I agree with the assessment, diagnosis, and plan of care documented in the resident's note.  

## 2022-09-16 NOTE — Addendum Note (Signed)
Addended by: Burnell Blanks on: 09/16/2022 08:51 AM   Modules accepted: Level of Service

## 2022-09-28 ENCOUNTER — Telehealth: Payer: Self-pay

## 2022-09-28 NOTE — Telephone Encounter (Signed)
[facility] pt reporting she is doing much better medically. A1C has dropped to 5.4. She is interested in returning to exercise and is working on coordinating transportation to do so.   VMT pt. Next PREP classes at [facility] will start in Oct.

## 2022-09-29 DIAGNOSIS — M14671 Charcot's joint, right ankle and foot: Secondary | ICD-10-CM | POA: Diagnosis not present

## 2022-09-29 DIAGNOSIS — E1140 Type 2 diabetes mellitus with diabetic neuropathy, unspecified: Secondary | ICD-10-CM | POA: Diagnosis not present

## 2022-09-29 DIAGNOSIS — M2022 Hallux rigidus, left foot: Secondary | ICD-10-CM | POA: Diagnosis not present

## 2022-09-29 DIAGNOSIS — M14672 Charcot's joint, left ankle and foot: Secondary | ICD-10-CM | POA: Diagnosis not present

## 2022-09-30 ENCOUNTER — Other Ambulatory Visit: Payer: Self-pay | Admitting: Student

## 2022-09-30 DIAGNOSIS — E669 Obesity, unspecified: Secondary | ICD-10-CM

## 2022-09-30 DIAGNOSIS — E1169 Type 2 diabetes mellitus with other specified complication: Secondary | ICD-10-CM

## 2022-09-30 NOTE — Telephone Encounter (Signed)
Next appt scheduled 12/20 with Dr Sherrilee Gilles.

## 2022-10-13 ENCOUNTER — Other Ambulatory Visit: Payer: Self-pay | Admitting: Student

## 2022-10-13 DIAGNOSIS — I10 Essential (primary) hypertension: Secondary | ICD-10-CM

## 2022-10-18 ENCOUNTER — Other Ambulatory Visit: Payer: Self-pay

## 2022-10-18 DIAGNOSIS — R112 Nausea with vomiting, unspecified: Secondary | ICD-10-CM | POA: Diagnosis not present

## 2022-10-18 MED ORDER — METFORMIN HCL ER 500 MG PO TB24: 500.0000 mg | ORAL_TABLET | Freq: Two times a day (BID) | ORAL | 3 refills | Status: DC

## 2022-10-26 ENCOUNTER — Ambulatory Visit: Payer: 59

## 2022-10-26 VITALS — Ht 68.0 in | Wt 219.0 lb

## 2022-10-26 DIAGNOSIS — Z0000 Encounter for general adult medical examination without abnormal findings: Secondary | ICD-10-CM

## 2022-10-26 DIAGNOSIS — K2930 Chronic superficial gastritis without bleeding: Secondary | ICD-10-CM | POA: Diagnosis not present

## 2022-10-26 DIAGNOSIS — D128 Benign neoplasm of rectum: Secondary | ICD-10-CM | POA: Diagnosis not present

## 2022-10-26 DIAGNOSIS — K449 Diaphragmatic hernia without obstruction or gangrene: Secondary | ICD-10-CM | POA: Diagnosis not present

## 2022-10-26 DIAGNOSIS — R110 Nausea: Secondary | ICD-10-CM | POA: Diagnosis not present

## 2022-10-26 DIAGNOSIS — K2100 Gastro-esophageal reflux disease with esophagitis, without bleeding: Secondary | ICD-10-CM | POA: Diagnosis not present

## 2022-10-26 DIAGNOSIS — R6881 Early satiety: Secondary | ICD-10-CM | POA: Diagnosis not present

## 2022-10-26 DIAGNOSIS — Z1211 Encounter for screening for malignant neoplasm of colon: Secondary | ICD-10-CM | POA: Diagnosis not present

## 2022-10-26 NOTE — Progress Notes (Signed)
Subjective:   [name] is a 67 y.o. female who presents for Medicare Annual (Subsequent) preventive examination.  Visit Complete: Virtual I connected with  [name] on 10/26/22 by a audio enabled telemedicine application and verified that I am speaking with the correct person using two identifiers.  Patient Location: Home  Provider Location: Home Office  I discussed the limitations of evaluation and management by telemedicine. The patient expressed understanding and agreed to proceed.  Vital Signs: Because this visit was a virtual/telehealth visit, some criteria may be missing or patient reported. Any vitals not documented were not able to be obtained and vitals that have been documented are patient reported.  Because this visit was a virtual/telehealth visit, some criteria may be missing or patient reported. Any vitals not documented were not able to be obtained and vitals that have been documented are patient reported.    Cardiac Risk Factors include: advanced age (>98men, >4 women);hypertension;diabetes mellitus;dyslipidemia;obesity (BMI >30kg/m2);Other (see comment), Risk factor comments: OSA, CKD     Objective:    Today's Vitals   10/26/22 1016  Weight: 219 lb (99.3 kg)  Height: 5\' 8"  (1.727 m)   Body mass index is 33.3 kg/m.     10/26/2022   10:43 AM 09/15/2022   11:09 AM 03/07/2022    2:36 PM 02/07/2022    4:34 PM 11/08/2021    1:59 PM 11/08/2021    1:31 PM 05/06/2021   11:41 AM  Advanced Directives  Does Patient Have a Medical Advance Directive? Yes No Yes Yes No No No  Type of Estate agent of [place];Living will  Living will;Healthcare Power of [employer] Power of [place];Living will     Does patient want to make changes to medical advance directive? No - Patient declined  No - Patient declined No - Patient declined     Copy of Healthcare Power of Attorney in Chart? Yes - validated most recent copy scanned in chart  (See row information)  Yes - validated most recent copy scanned in chart (See row information) Yes - validated most recent copy scanned in chart (See row information)     Would patient like information on creating a medical advance directive? No - Patient declined No - Patient declined   No - Patient declined Yes (ED - Information included in AVS) No - Patient declined    Current Medications (verified) Outpatient Encounter Medications as of 10/26/2022  Medication Sig   Accu-Chek Softclix Lancets lancets USE ONE LANCET TO TEST THREE TIMES A DAY   acetaminophen (TYLENOL) 500 MG tablet Take 500-1,000 mg by mouth every 6 (six) hours as needed for moderate pain or headache.   Ascorbic Acid (VITAMIN C) 1000 MG tablet Take 1,000 mg by mouth daily. Rose hips   aspirin EC 81 MG tablet Take 81 mg by mouth daily. Swallow whole.   atorvastatin (LIPITOR) 40 MG tablet Take 1 tablet (40 mg total) by mouth daily.   Biotin 5000 MCG CAPS Take 5,000 mcg by mouth daily.   Blood Glucose Monitoring Suppl (ACCU-CHEK AVIVA PLUS) w/Device KIT Use to monitor blood sugars   Blood Pressure Monitoring (ADULT BLOOD PRESSURE CUFF LG) KIT Use to monitor your BP daily and bring log during each visit   Cholecalciferol (VITAMIN D) 50 MCG (2000 UT) tablet Take 2,000 Units by mouth daily.   cycloSPORINE (RESTASIS) 0.05 % ophthalmic emulsion Place 1 drop into both eyes 2 (two) times daily. 8 am and 1700   Emollient (BAG  BALM) OINT Apply to feet twice daily   ferrous sulfate 325 (65 FE) MG tablet Take 1 tablet every other day with breakfast (Patient taking differently: Take 325 mg by mouth daily with breakfast. Take 1 tablet every other day with breakfast)   glucose blood (ACCU-CHEK GUIDE) test strip USE ONE STRIP TO TEST THREE TIMES A DAY   lisinopril (ZESTRIL) 20 MG tablet TAKE 1 TABLET BY MOUTH DAILY   metFORMIN (GLUCOPHAGE-XR) 500 MG 24 hr tablet Take 1 tablet (500 mg total) by mouth 2 (two) times daily with a meal.   Multiple  Vitamin (MULTIVITAMIN) tablet Take 1 tablet by mouth daily. 50 plus   Netarsudil-Latanoprost (ROCKLATAN) 0.02-0.005 % SOLN Place 1 drop into both eyes daily at 10 pm.   OZEMPIC, 0.25 OR 0.5 MG/DOSE, 2 MG/3ML SOPN DIAL AND INJECT UNDER THE SKIN 0.5 MG WEEKLY   pantoprazole (PROTONIX) 20 MG tablet Take 1 tablet (20 mg total) by mouth 2 (two) times daily.   Polyvinyl Alcohol-Povidone (REFRESH OP) Place 1 drop into both eyes daily as needed (dry eyes).   SIMBRINZA 1-0.2 % SUSP Place into both eyes in the morning and at bedtime.   [DISCONTINUED] pravastatin (PRAVACHOL) 20 MG tablet Take 1 tablet (20 mg total) by mouth daily.   No facility-administered encounter medications on file as of 10/26/2022.    Allergies (verified) Codeine, Pravastatin, and Latex   History: Past Medical History:  Diagnosis Date   Arthritis    Chronic normocytic anemia    CKD (chronic kidney disease) stage 3, GFR 30-59 ml/min (HCC) 02/02/2013   Diabetes mellitus type II, controlled (HCC) 06/17/2009   Diabetic ulcer of both feet (HCC) 01/07/2010   Qualifier: Diagnosis of  By: [name] MD, Amanjot     Essential hypertension 06/17/2009   GERD (gastroesophageal reflux disease)    Glaucoma    H/O hiatal hernia    Hardware complicating wound infection (HCC) 12/28/2020   Hyperlipidemia    MRSA (methicillin resistant Staphylococcus aureus)    Obstructive sleep apnea 05/26/2010   Polymicrobial bacterial infection 12/28/2020   Past Surgical History:  Procedure Laterality Date   ACHILLES TENDON LENGTHENING Left 11/06/2014   Procedure: LEFT ACHILLES TENDON LENGTHENING, ;  Surgeon: Toni Arthurs, MD;  Location: [facility] OR;  Service: Orthopedics;  Laterality: Left;   APPENDECTOMY  1975   FOOT ARTHRODESIS Left 11/06/2014   Procedure: TALONAVICULAR CANCELLOUS CUBOID ARTHRODESIS WITH CORRECTIVE OSTEOTOMY ;  Surgeon: Toni Arthurs, MD;  Location: [facility] OR;  Service: Orthopedics;  Laterality: Left;   FOOT ARTHRODESIS Right 05/14/2020    Procedure: Right heelcord lengthening, subtalar and talonavicular arthrodesis, talus exostectomy, talus corrective osteotomy;  Surgeon: Toni Arthurs, MD;  Location: [facility] OR;  Service: Orthopedics;  Laterality: Right;   HARDWARE REMOVAL Left 02/19/2015   Procedure: REMOVAL OF LEFT FOOT EXTERNAL FIXATOR, CASTING OF LEFT ANKLE ;  Surgeon: Toni Arthurs, MD;  Location: [facility];  Service: Orthopedics;  Laterality: Left;   HARDWARE REMOVAL Right 12/17/2020   Procedure: Removal of deep implant right foot;  Surgeon: Toni Arthurs, MD;  Location: [facility] OR;  Service: Orthopedics;  Laterality: Right;   I & D EXTREMITY Left 2011   "ulcer on my foot got infected" (01/30/2013)   I & D EXTREMITY Right 05/06/2021   Procedure: Irrigation and excisional debridement of right ankle abcess;  Surgeon: Toni Arthurs, MD;  Location: [facility];  Service: Orthopedics;  Laterality: Right;    MULTIPLE TOOTH EXTRACTIONS Bilateral    OOPHORECTOMY Bilateral [phone]  Family History  Problem Relation Age of Onset   Diabetes Mother    Hypertension Mother    Dementia Mother    Heart disease Father    Stroke Father    Diabetes Father    Hypertension Father    Gout Father    Deep vein thrombosis Father    Breast cancer Cousin    Breast cancer Cousin    Social History   Socioeconomic History   Marital status: Single    Spouse name: Not on file   Number of children: Not on file   Years of education: Not on file   Highest education level: Not on file  Occupational History   Occupation: Disabled  Tobacco Use   Smoking status: Former    Current packs/day: 0.00    Types: Cigarettes    Start date: 12/22/1972    Quit date: 12/22/1972    Years since quitting: 49.8   Smokeless tobacco: Never  Vaping Use   Vaping status: Never Used  Substance and Sexual Activity   Alcohol use: No   Drug use: No   Sexual activity: Not Currently  Other Topics Concern   Not on file  Social History  Narrative   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   Smoked when she was a teenager   no alcohol   no Drugs      Current Social History 10/26/2018        Patient lives on the main level of a 3 floor boarding house with 3 other tenants. There are 3 steps with handrails up to the entrance the patient uses.       Patient's method of transportation is personal car.      The highest level of education was Bachelor's Degree.      The patient currently disabled.      Identified important Relationships are "My cousins, [name] and Lianne Moris, and my dear friend Mliss Sax."      Pets : None       Interests / Fun: "I love to read, write, photography: go around [place] taking photos of the [address], [employer], Art Museums, cooking, hanging out with friends, several ministries with my Church, especially working with the youth."      Current Stressors: "Worrying about someone to care for me if anything happens to me. 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[place]atte, BSN, RN-BC          Social Determinants of Health   Financial Resource Strain: Low Risk  (10/26/2022)   Overall Financial Resource Strain (CARDIA)    Difficulty of Paying Living Expenses: Not very hard  Food Insecurity: No Food Insecurity (10/26/2022)   Hunger Vital Sign    Worried About Running Out of Food in the Last Year: Never true    Ran Out of Food in the Last Year: Never true  Transportation Needs: No Transportation Needs (10/26/2022)   PRAPARE - Administrator, Civil Service (Medical): No    Lack of Transportation (Non-Medical): No  Physical Activity: Inactive (10/26/2022)   Exercise Vital Sign    Days of Exercise per Week: 0 days    Minutes of Exercise per Session: 0 min  Stress: No Stress Concern Present (10/26/2022)   [employer] of Occupational Health - Occupational  Stress Questionnaire    Feeling  of Stress : Only a little  Social Connections: Moderately Integrated (10/26/2022)   Social Connection and Isolation Panel [NHANES]    Frequency of Communication with Friends and Family: More than three times a week    Frequency of Social Gatherings with Friends and Family: Three times a week    Attends Religious Services: More than 4 times per year    Active Member of Clubs or Organizations: Yes    Attends Engineer, structural: More than 4 times per year    Marital Status: Never married    Tobacco Counseling Counseling given: Not Answered   Clinical Intake:  Pre-visit preparation completed: Yes  Pain : No/denies pain     BMI - recorded: 33.3 Nutritional Status: BMI > 30  Obese Nutritional Risks: None Diabetes: Yes CBG done?: No Did pt. bring in CBG monitor from home?: No  How often do you need to have someone help you when you read instructions, pamphlets, or other written materials from your doctor or pharmacy?: 1 - Never  Interpreter Needed?: No  Information entered by :: Quinlyn Tep, RMA   Activities of Daily Living    10/26/2022   10:32 AM 09/15/2022   11:12 AM  In your present state of health, do you have any difficulty performing the following activities:  Hearing? 0 0  Vision? 0 0  Difficulty concentrating or making decisions? 0 0  Walking or climbing stairs? 1 0  Comment sometimes has trouble with her balance.   Dressing or bathing? 0 0  Doing errands, shopping? 0 0  Comment someone takes her around.  Waiting on SCAT   Using the Toilet? N   In the past six months, have you accidently leaked urine? N   Do you have problems with loss of bowel control? N   Managing your Medications? N   Managing your Finances? N   Housekeeping or managing your Housekeeping? N     Patient Care Team: Modena Slater, DO as PCP - General Lucille Passy, MD as Referring Physician (Ophthalmology) Jacinta Shoe, PA-C as Physician  Assistant (Physician Assistant) Toni Arthurs, MD as Consulting Physician (Orthopedic Surgery) Davina Poke as Consulting Physician (Optometry) [place], Rick (Inactive) (Orthotics) Willis Modena, MD as Consulting Physician (Gastroenterology)  Indicate any recent Medical Services you may have received from other than [facility] providers in the past year (date may be approximate).     Assessment:   This is a routine wellness examination for [place].  Hearing/Vision screen Hearing Screening - Comments:: Denies hearing difficulties   Vision Screening - Comments:: Denies vision issues.   Goals Addressed               This Visit's Progress     Patient Stated (pt-stated)        Would like to get to a healthy weight.      Depression Screen    10/26/2022   10:51 AM 09/15/2022   11:12 AM 02/07/2022    4:33 PM 11/08/2021    1:58 PM 11/08/2021    1:30 PM 04/22/2021    1:16 PM 03/18/2021    1:16 PM  PHQ 2/9 Scores  PHQ - 2 Score 0 0 1 0 0 0 0  PHQ- 9 Score 2 5 2         Fall Risk    10/26/2022   10:43 AM 09/15/2022   11:12 AM 06/06/2022    2:19 PM 02/07/2022    1:34 PM 11/08/2021    1:59 PM  Fall Risk   Falls in the past year? 0 0 0 0 0  Number falls in past yr: 0  0 0 0  Injury with Fall? 0  0 0 0  Risk for fall due to : No Fall Risks Impaired balance/gait  Impaired balance/gait Impaired balance/gait  Follow up Falls prevention discussed;Falls evaluation completed Falls evaluation completed Falls evaluation completed Falls evaluation completed Falls evaluation completed;Falls prevention discussed    MEDICARE RISK AT HOME: Medicare Risk at Home Any stairs in or around the home?: Yes If so, are there any without handrails?: Yes Home free of loose throw rugs in walkways, pet beds, electrical cords, etc?: Yes Adequate lighting in your home to reduce risk of falls?: Yes Life alert?: No Use of a cane, walker or w/c?: Yes Grab bars in the bathroom?: Yes Shower chair or bench in  shower?: Yes Elevated toilet seat or a handicapped toilet?: Yes  TIMED UP AND GO:  Was the test performed?  No    Cognitive Function:        10/26/2022   10:44 AM 11/08/2021    2:01 PM  6CIT Screen  What Year? 0 points 0 points  What month? 0 points 0 points  What time? 0 points 0 points  Count back from 20 0 points 0 points  Months in reverse 0 points 0 points  Repeat phrase 0 points 0 points  Total Score 0 points 0 points    Immunizations Immunization History  Administered Date(s) Administered   Fluad Quad(high Dose 65+) 12/24/2020, 11/08/2021   Fluad Trivalent(High Dose 65+) 09/15/2022   Influenza Split 12/24/2010, 10/07/2011   Influenza Whole 09/24/2009   Influenza,inj,Quad PF,6+ Mos 10/10/2012, 09/11/2013, 10/02/2014, 12/24/2015, 01/06/2017, 10/11/2017, 12/03/2018, 12/17/2019   Influenza-Unspecified 12/29/2016   PFIZER(Purple Top)SARS-COV-2 Vaccination 08/30/2019, 09/11/2019, 09/11/2019, 03/06/2020, 03/18/2022   PNEUMOCOCCAL CONJUGATE-20 03/04/2021   PPD Test 11/24/2014   Pneumococcal Conjugate-13 01/01/2015   Pneumococcal Polysaccharide-23 12/29/2009, 07/21/2016   Td 12/29/2009   Tdap 03/18/2021   Zoster, Live 01/26/2016    TDAP status: Up to date  Flu Vaccine status: Up to date  Pneumococcal vaccine status: Up to date  Covid-19 vaccine status: Completed vaccines  Qualifies for Shingles Vaccine? Yes   Zostavax completed Yes   Shingrix Completed?: No.    Education has been provided regarding the importance of this vaccine. Patient has been advised to call insurance company to determine out of pocket expense if they have not yet received this vaccine. Advised may also receive vaccine at local pharmacy or Health Dept. Verbalized acceptance and understanding.  Screening Tests Health Maintenance  Topic Date Due   Zoster Vaccines- Shingrix (1 of 2) 10/31/2005   COVID-19 Vaccine (6 - 2023-24 season) 09/18/2022   Diabetic kidney evaluation - eGFR measurement   02/08/2023   FOOT EXAM  02/08/2023   HEMOGLOBIN A1C  03/17/2023   LIPID PANEL  06/06/2023   OPHTHALMOLOGY EXAM  06/23/2023   Diabetic kidney evaluation - Urine ACR  09/15/2023   Medicare Annual Wellness (AWV)  10/26/2023   MAMMOGRAM  11/27/2023   Colonoscopy  07/08/2027   DTaP/Tdap/Td (3 - Td or Tdap) 03/19/2031   Pneumonia Vaccine 49+ Years old  Completed   INFLUENZA VACCINE  Completed   DEXA SCAN  Completed   Hepatitis C Screening  Completed   HPV VACCINES  Aged Out   COLON CANCER SCREENING ANNUAL FOBT  Discontinued    Health Maintenance  Health Maintenance Due  Topic Date Due   Zoster Vaccines- Shingrix (  1 of 2) 10/31/2005   COVID-19 Vaccine (6 - 2023-24 season) 09/18/2022    Colorectal cancer screening: Type of screening: Colonoscopy. Completed 10/26/2022. Repeat every N/A years  Mammogram status: Completed 11/26/2021. Repeat every year  Bone Density status: Completed 08/05/2022. Results reflect: Bone density results: OSTEOPENIA. Repeat every 2 years.  Lung Cancer Screening: (Low Dose CT Chest recommended if Age 69-80 years, 20 pack-year currently smoking OR have quit w/in 15years.) does not qualify.   Lung Cancer Screening Referral: N/A  Additional Screening:  Hepatitis C Screening: does qualify; Completed 08/25/2016  Vision Screening: Recommended annual ophthalmology exams for early detection of glaucoma and other disorders of the eye. Is the patient up to date with their annual eye exam?  Yes  Who is the provider or what is the name of the office in which the patient attends annual eye exams? Dr. Ramonita Lab and Dr. Shea Evans If pt is not established with a provider, would they like to be referred to a provider to establish care? No .   Dental Screening: Recommended annual dental exams for proper oral hygiene  Diabetic Foot Exam: Diabetic Foot Exam: Completed 02/07/2022  Community Resource Referral / Chronic Care Management: CRR required this visit?  No   CCM required  this visit?  No     Plan:     I have personally reviewed and noted the following in the patient's chart:   Medical and social history Use of alcohol, tobacco or illicit drugs  Current medications and supplements including opioid prescriptions. Patient is not currently taking opioid prescriptions. Functional ability and status Nutritional status Physical activity Advanced directives List of other physicians Hospitalizations, surgeries, and ER visits in previous 12 months Vitals Screenings to include cognitive, depression, and falls Referrals and appointments  In addition, I have reviewed and discussed with patient certain preventive protocols, quality metrics, and best practice recommendations. A written personalized care plan for preventive services as well as general preventive health recommendations were provided to patient.     Allani Reber L Ardelia Wrede, CMA   10/26/2022   After Visit Summary: (MyChart) Due to this being a telephonic visit, the after visit summary with patients personalized plan was offered to patient via MyChart   Nurse Notes: Patient is due for a 2nd Shingles vaccine.  Patient is scheduled for a Colonoscopy today at  [facility], with Dr. Dulce Sellar.  She also is scheduled to have a mammogram on 11/29/2022.  Patient had no other concerns to discuss today.

## 2022-10-26 NOTE — Patient Instructions (Signed)
[name] , Thank you for taking time to come for your Medicare Wellness Visit. I appreciate your ongoing commitment to your health goals. Please review the following plan we discussed and let me know if I can assist you in the future.   Referrals/Orders/Follow-Ups/Clinician Recommendations: You are due for a 2nd Shingles vaccine.  You can get that at your pharmacy.  Keep up the good work and I am wishing you a very happy birthday.  This is a list of the screening recommended for you and due dates:  Health Maintenance  Topic Date Due   Zoster (Shingles) Vaccine (1 of 2) 10/31/2005   COVID-19 Vaccine (5 - 2023-24 season) 09/18/2022   Yearly kidney function blood test for diabetes  02/08/2023   Complete foot exam   02/08/2023   Hemoglobin A1C  03/17/2023   Lipid (cholesterol) test  06/06/2023   Eye exam for diabetics  06/23/2023   Yearly kidney health urinalysis for diabetes  09/15/2023   Medicare Annual Wellness Visit  10/26/2023   Mammogram  11/27/2023   Colon Cancer Screening  07/08/2027   DTaP/Tdap/Td vaccine (3 - Td or Tdap) 03/19/2031   Pneumonia Vaccine  Completed   Flu Shot  Completed   DEXA scan (bone density measurement)  Completed   Hepatitis C Screening  Completed   HPV Vaccine  Aged Out   Stool Blood Test  Discontinued    Advanced directives: (In Chart) A copy of your advanced directives are scanned into your chart should your provider ever need it.  Next Medicare Annual Wellness Visit scheduled for next year: Yes

## 2022-11-01 DIAGNOSIS — K2930 Chronic superficial gastritis without bleeding: Secondary | ICD-10-CM | POA: Diagnosis not present

## 2022-11-01 DIAGNOSIS — D128 Benign neoplasm of rectum: Secondary | ICD-10-CM | POA: Diagnosis not present

## 2022-11-10 DIAGNOSIS — H4311 Vitreous hemorrhage, right eye: Secondary | ICD-10-CM | POA: Diagnosis not present

## 2022-11-10 DIAGNOSIS — H31093 Other chorioretinal scars, bilateral: Secondary | ICD-10-CM | POA: Diagnosis not present

## 2022-11-10 DIAGNOSIS — H3582 Retinal ischemia: Secondary | ICD-10-CM | POA: Diagnosis not present

## 2022-11-10 DIAGNOSIS — E113593 Type 2 diabetes mellitus with proliferative diabetic retinopathy without macular edema, bilateral: Secondary | ICD-10-CM | POA: Diagnosis not present

## 2022-11-10 DIAGNOSIS — H43813 Vitreous degeneration, bilateral: Secondary | ICD-10-CM | POA: Diagnosis not present

## 2022-11-10 DIAGNOSIS — H35372 Puckering of macula, left eye: Secondary | ICD-10-CM | POA: Diagnosis not present

## 2022-11-10 DIAGNOSIS — H43392 Other vitreous opacities, left eye: Secondary | ICD-10-CM | POA: Diagnosis not present

## 2022-11-10 DIAGNOSIS — H35033 Hypertensive retinopathy, bilateral: Secondary | ICD-10-CM | POA: Diagnosis not present

## 2022-11-10 LAB — HM DIABETES EYE EXAM

## 2022-11-22 ENCOUNTER — Other Ambulatory Visit: Payer: Self-pay | Admitting: Student

## 2022-11-22 DIAGNOSIS — E66812 Obesity, class 2: Secondary | ICD-10-CM

## 2022-11-22 DIAGNOSIS — E1169 Type 2 diabetes mellitus with other specified complication: Secondary | ICD-10-CM

## 2022-11-22 NOTE — Telephone Encounter (Signed)
Next appt scheduled 12/20 with Dr Sherrilee Gilles.

## 2022-11-23 ENCOUNTER — Encounter: Payer: Self-pay | Admitting: Dietician

## 2022-11-29 ENCOUNTER — Ambulatory Visit
Admission: RE | Admit: 2022-11-29 | Discharge: 2022-11-29 | Disposition: A | Discharge status: Home / Self Care | Payer: 59 | Source: Ambulatory Visit | Attending: Internal Medicine | Admitting: Internal Medicine

## 2022-11-29 DIAGNOSIS — Z1231 Encounter for screening mammogram for malignant neoplasm of breast: Secondary | ICD-10-CM

## 2022-12-06 ENCOUNTER — Encounter (HOSPITAL_COMMUNITY): Payer: Self-pay

## 2022-12-06 ENCOUNTER — Emergency Department (HOSPITAL_COMMUNITY): Payer: 59

## 2022-12-06 ENCOUNTER — Inpatient Hospital Stay (HOSPITAL_COMMUNITY)
Admission: EM | Admit: 2022-12-06 | Discharge: 2022-12-12 | DRG: 641 | Disposition: A | Discharge status: Home / Self Care | Payer: 59 | Attending: Internal Medicine | Admitting: Internal Medicine

## 2022-12-06 ENCOUNTER — Other Ambulatory Visit: Payer: Self-pay

## 2022-12-06 DIAGNOSIS — E119 Type 2 diabetes mellitus without complications: Secondary | ICD-10-CM | POA: Diagnosis not present

## 2022-12-06 DIAGNOSIS — N179 Acute kidney failure, unspecified: Secondary | ICD-10-CM | POA: Diagnosis present

## 2022-12-06 DIAGNOSIS — D631 Anemia in chronic kidney disease: Secondary | ICD-10-CM | POA: Diagnosis present

## 2022-12-06 DIAGNOSIS — Z8249 Family history of ischemic heart disease and other diseases of the circulatory system: Secondary | ICD-10-CM

## 2022-12-06 DIAGNOSIS — E785 Hyperlipidemia, unspecified: Secondary | ICD-10-CM | POA: Diagnosis present

## 2022-12-06 DIAGNOSIS — N183 Chronic kidney disease, stage 3 unspecified: Secondary | ICD-10-CM | POA: Diagnosis present

## 2022-12-06 DIAGNOSIS — I951 Orthostatic hypotension: Secondary | ICD-10-CM | POA: Diagnosis not present

## 2022-12-06 DIAGNOSIS — Z803 Family history of malignant neoplasm of breast: Secondary | ICD-10-CM

## 2022-12-06 DIAGNOSIS — Z801 Family history of malignant neoplasm of trachea, bronchus and lung: Secondary | ICD-10-CM

## 2022-12-06 DIAGNOSIS — W1839XA Other fall on same level, initial encounter: Secondary | ICD-10-CM | POA: Diagnosis not present

## 2022-12-06 DIAGNOSIS — Z7984 Long term (current) use of oral hypoglycemic drugs: Secondary | ICD-10-CM | POA: Diagnosis not present

## 2022-12-06 DIAGNOSIS — Z888 Allergy status to other drugs, medicaments and biological substances status: Secondary | ICD-10-CM

## 2022-12-06 DIAGNOSIS — Z87891 Personal history of nicotine dependence: Secondary | ICD-10-CM

## 2022-12-06 DIAGNOSIS — S0093XA Contusion of unspecified part of head, initial encounter: Secondary | ICD-10-CM | POA: Diagnosis not present

## 2022-12-06 DIAGNOSIS — E1169 Type 2 diabetes mellitus with other specified complication: Secondary | ICD-10-CM | POA: Diagnosis present

## 2022-12-06 DIAGNOSIS — W19XXXA Unspecified fall, initial encounter: Secondary | ICD-10-CM | POA: Diagnosis not present

## 2022-12-06 DIAGNOSIS — K219 Gastro-esophageal reflux disease without esophagitis: Secondary | ICD-10-CM | POA: Diagnosis present

## 2022-12-06 DIAGNOSIS — R1310 Dysphagia, unspecified: Secondary | ICD-10-CM | POA: Diagnosis not present

## 2022-12-06 DIAGNOSIS — K224 Dyskinesia of esophagus: Secondary | ICD-10-CM | POA: Diagnosis present

## 2022-12-06 DIAGNOSIS — I129 Hypertensive chronic kidney disease with stage 1 through stage 4 chronic kidney disease, or unspecified chronic kidney disease: Secondary | ICD-10-CM | POA: Diagnosis present

## 2022-12-06 DIAGNOSIS — K5900 Constipation, unspecified: Secondary | ICD-10-CM | POA: Diagnosis not present

## 2022-12-06 DIAGNOSIS — Z6831 Body mass index (BMI) 31.0-31.9, adult: Secondary | ICD-10-CM | POA: Diagnosis not present

## 2022-12-06 DIAGNOSIS — K5790 Diverticulosis of intestine, part unspecified, without perforation or abscess without bleeding: Secondary | ICD-10-CM | POA: Diagnosis not present

## 2022-12-06 DIAGNOSIS — G4733 Obstructive sleep apnea (adult) (pediatric): Secondary | ICD-10-CM | POA: Diagnosis present

## 2022-12-06 DIAGNOSIS — R112 Nausea with vomiting, unspecified: Secondary | ICD-10-CM | POA: Diagnosis present

## 2022-12-06 DIAGNOSIS — K8020 Calculus of gallbladder without cholecystitis without obstruction: Secondary | ICD-10-CM | POA: Diagnosis not present

## 2022-12-06 DIAGNOSIS — Y92231 Patient bathroom in hospital as the place of occurrence of the external cause: Secondary | ICD-10-CM | POA: Diagnosis not present

## 2022-12-06 DIAGNOSIS — Z833 Family history of diabetes mellitus: Secondary | ICD-10-CM

## 2022-12-06 DIAGNOSIS — Z91040 Latex allergy status: Secondary | ICD-10-CM

## 2022-12-06 DIAGNOSIS — S0990XA Unspecified injury of head, initial encounter: Secondary | ICD-10-CM | POA: Diagnosis not present

## 2022-12-06 DIAGNOSIS — Z7982 Long term (current) use of aspirin: Secondary | ICD-10-CM | POA: Diagnosis not present

## 2022-12-06 DIAGNOSIS — M545 Low back pain, unspecified: Secondary | ICD-10-CM | POA: Diagnosis present

## 2022-12-06 DIAGNOSIS — K5909 Other constipation: Secondary | ICD-10-CM | POA: Diagnosis present

## 2022-12-06 DIAGNOSIS — E1122 Type 2 diabetes mellitus with diabetic chronic kidney disease: Secondary | ICD-10-CM | POA: Diagnosis present

## 2022-12-06 DIAGNOSIS — Z885 Allergy status to narcotic agent status: Secondary | ICD-10-CM

## 2022-12-06 DIAGNOSIS — S199XXA Unspecified injury of neck, initial encounter: Secondary | ICD-10-CM | POA: Diagnosis not present

## 2022-12-06 DIAGNOSIS — E669 Obesity, unspecified: Secondary | ICD-10-CM | POA: Diagnosis present

## 2022-12-06 DIAGNOSIS — Z823 Family history of stroke: Secondary | ICD-10-CM

## 2022-12-06 DIAGNOSIS — Z79899 Other long term (current) drug therapy: Secondary | ICD-10-CM

## 2022-12-06 DIAGNOSIS — E876 Hypokalemia: Secondary | ICD-10-CM | POA: Diagnosis present

## 2022-12-06 DIAGNOSIS — I10 Essential (primary) hypertension: Secondary | ICD-10-CM | POA: Diagnosis not present

## 2022-12-06 DIAGNOSIS — K449 Diaphragmatic hernia without obstruction or gangrene: Secondary | ICD-10-CM | POA: Diagnosis present

## 2022-12-06 DIAGNOSIS — E118 Type 2 diabetes mellitus with unspecified complications: Secondary | ICD-10-CM | POA: Diagnosis present

## 2022-12-06 DIAGNOSIS — J9811 Atelectasis: Secondary | ICD-10-CM | POA: Diagnosis not present

## 2022-12-06 LAB — CBC
HCT: 30.5 % — ABNORMAL LOW (ref 36.0–46.0)
Hemoglobin: 10.2 g/dL — ABNORMAL LOW (ref 12.0–15.0)
MCH: 30.5 pg (ref 26.0–34.0)
MCHC: 33.4 g/dL (ref 30.0–36.0)
MCV: 91.3 fL (ref 80.0–100.0)
Platelets: 203 10*3/uL (ref 150–400)
RBC: 3.34 MIL/uL — ABNORMAL LOW (ref 3.87–5.11)
RDW: 14.9 % (ref 11.5–15.5)
WBC: 6.3 10*3/uL (ref 4.0–10.5)
nRBC: 0 % (ref 0.0–0.2)

## 2022-12-06 LAB — COMPREHENSIVE METABOLIC PANEL
ALT: 10 U/L (ref 0–44)
AST: 22 U/L (ref 15–41)
Albumin: 4 g/dL (ref 3.5–5.0)
Alkaline Phosphatase: 123 U/L (ref 38–126)
Anion gap: 10 (ref 5–15)
BUN: 29 mg/dL — ABNORMAL HIGH (ref 8–23)
CO2: 31 mmol/L (ref 22–32)
Calcium: >15 mg/dL (ref 8.9–10.3)
Chloride: 97 mmol/L — ABNORMAL LOW (ref 98–111)
Creatinine, Ser: 2.75 mg/dL — ABNORMAL HIGH (ref 0.44–1.00)
GFR, Estimated: 18 mL/min — ABNORMAL LOW (ref >60)
Glucose, Bld: 81 mg/dL (ref 70–99)
Potassium: 3.4 mmol/L — ABNORMAL LOW (ref 3.5–5.1)
Sodium: 138 mmol/L (ref 135–145)
Total Bilirubin: 0.8 mg/dL (ref <1.2)
Total Protein: 7.1 g/dL (ref 6.5–8.1)

## 2022-12-06 LAB — VITAMIN D 25 HYDROXY (VIT D DEFICIENCY, FRACTURES): Vit D, 25-Hydroxy: 89.54 ng/mL (ref 30–100)

## 2022-12-06 LAB — LIPASE, BLOOD: Lipase: 27 U/L (ref 11–51)

## 2022-12-06 MED ORDER — SODIUM CHLORIDE 0.9% FLUSH
3.0000 mL | INTRAVENOUS | Status: DC | PRN
  Administered 2022-12-11: 3 mL via INTRAVENOUS

## 2022-12-06 MED ORDER — PANTOPRAZOLE SODIUM 20 MG PO TBEC
20.0000 mg | DELAYED_RELEASE_TABLET | Freq: Two times a day (BID) | ORAL | Status: DC
  Administered 2022-12-06 – 2022-12-12 (×12): 20 mg via ORAL
  Filled 2022-12-06 (×12): qty 1

## 2022-12-06 MED ORDER — DOCUSATE SODIUM 100 MG PO CAPS: 100.0000 mg | ORAL_CAPSULE | Freq: Every day | ORAL | Status: DC

## 2022-12-06 MED ORDER — CALCITONIN (SALMON) 200 UNIT/ML IJ SOLN
4.0000 [IU]/kg | Freq: Two times a day (BID) | INTRAMUSCULAR | Status: AC
  Administered 2022-12-07 – 2022-12-08 (×4): 400 [IU] via SUBCUTANEOUS
  Filled 2022-12-06 (×4): qty 2

## 2022-12-06 MED ORDER — SODIUM CHLORIDE 0.9 % IV BOLUS
1000.0000 mL | Freq: Once | INTRAVENOUS | Status: AC
  Administered 2022-12-06: 1000 mL via INTRAVENOUS

## 2022-12-06 MED ORDER — MORPHINE SULFATE (PF) 4 MG/ML IV SOLN
4.0000 mg | Freq: Once | INTRAVENOUS | Status: AC
  Administered 2022-12-06: 4 mg via INTRAVENOUS
  Filled 2022-12-06: qty 1

## 2022-12-06 MED ORDER — ENOXAPARIN SODIUM 40 MG/0.4ML IJ SOSY
40.0000 mg | PREFILLED_SYRINGE | INTRAMUSCULAR | Status: DC
  Administered 2022-12-06: 40 mg via SUBCUTANEOUS
  Filled 2022-12-06: qty 0.4

## 2022-12-06 MED ORDER — LACTATED RINGERS IV SOLN: INTRAVENOUS | Status: AC

## 2022-12-06 MED ORDER — POLYETHYLENE GLYCOL 3350 17 G PO PACK
17.0000 g | PACK | Freq: Every day | ORAL | Status: DC
  Administered 2022-12-07 – 2022-12-08 (×2): 17 g via ORAL
  Filled 2022-12-06 (×2): qty 1

## 2022-12-06 MED ORDER — POTASSIUM CHLORIDE CRYS ER 20 MEQ PO TBCR
40.0000 meq | EXTENDED_RELEASE_TABLET | Freq: Once | ORAL | Status: AC
  Administered 2022-12-06: 40 meq via ORAL
  Filled 2022-12-06: qty 2

## 2022-12-06 MED ORDER — ATORVASTATIN CALCIUM 40 MG PO TABS
40.0000 mg | ORAL_TABLET | Freq: Every day | ORAL | Status: DC
  Administered 2022-12-06 – 2022-12-12 (×7): 40 mg via ORAL
  Filled 2022-12-06 (×7): qty 1

## 2022-12-06 NOTE — ED Provider Notes (Signed)
[name] EMERGENCY DEPARTMENT AT [facility] Provider Note   CSN: [id number] Arrival date & time: 12/06/22  1523     History  No chief complaint on file.   [name] is a 67 y.o. female with past medical history seen for diabetes, hypertension, obesity, CKD, GERD who presents with concern for constipation, possible bowel obstruction.  Pt complains of abdominal pain, nausea, vomiting, last bowel movement 1 week ago, last passage of gas was yesterday.  Was told to come to the ED due to concern for possible bowel obstruction by GI doctor, recent endoscopy, colonoscopy.  She denies any chest pain, reports no blood in emesis or recent stool.  HPI     Home Medications Prior to Admission medications   Medication Sig Start Date End Date Taking? Authorizing Provider  Accu-Chek Softclix Lancets lancets USE ONE LANCET TO TEST THREE TIMES A DAY 01/18/22   Modena Slater, DO  acetaminophen (TYLENOL) 500 MG tablet Take 500-1,000 mg by mouth every 6 (six) hours as needed for moderate pain or headache.    [provider]  Ascorbic Acid (VITAMIN C) 1000 MG tablet Take 1,000 mg by mouth daily. Rose hips    [provider]  aspirin EC 81 MG tablet Take 81 mg by mouth daily. Swallow whole.    [provider]  atorvastatin (LIPITOR) 40 MG tablet Take 1 tablet (40 mg total) by mouth daily. 03/07/22 03/02/23  Nooruddin, Jason Fila, MD  Biotin 5000 MCG CAPS Take 5,000 mcg by mouth daily.    [provider]  Blood Glucose Monitoring Suppl (ACCU-CHEK AVIVA PLUS) w/Device KIT Use to monitor blood sugars 12/23/20   Carmel Sacramento, MD  Blood Pressure Monitoring (ADULT BLOOD PRESSURE CUFF LG) KIT Use to monitor your BP daily and bring log during each visit 12/24/20   Carmel Sacramento, MD  Cholecalciferol (VITAMIN D) 50 MCG (2000 UT) tablet Take 2,000 Units by mouth daily.    [provider]  cycloSPORINE (RESTASIS) 0.05 % ophthalmic emulsion Place 1 drop into both eyes 2  (two) times daily. 8 am and 1700    [provider]  Emollient (BAG BALM) OINT Apply to feet twice daily 02/07/22   Steffanie Rainwater, MD  ferrous sulfate 325 (65 FE) MG tablet Take 1 tablet every other day with breakfast Patient taking differently: Take 325 mg by mouth daily with breakfast. Take 1 tablet every other day with breakfast 06/06/22   Modena Slater, DO  glucose blood (ACCU-CHEK GUIDE) test strip USE ONE STRIP TO TEST THREE TIMES A DAY 01/13/22   Katsadouros, Vasilios, MD  lisinopril (ZESTRIL) 20 MG tablet TAKE 1 TABLET BY MOUTH DAILY 10/13/22   Modena Slater, DO  metFORMIN (GLUCOPHAGE-XR) 500 MG 24 hr tablet Take 1 tablet (500 mg total) by mouth 2 (two) times daily with a meal. 10/18/22 04/16/23  Modena Slater, DO  Multiple Vitamin (MULTIVITAMIN) tablet Take 1 tablet by mouth daily. 50 plus    [provider]  Netarsudil-Latanoprost (ROCKLATAN) 0.02-0.005 % SOLN Place 1 drop into both eyes daily at 10 pm. 09/26/18   [provider]  OZEMPIC, 0.25 OR 0.5 MG/DOSE, 2 MG/3ML SOPN DIAL AND INJECT UNDER THE SKIN 0.5 MG WEEKLY 11/22/22   Modena Slater, DO  pantoprazole (PROTONIX) 20 MG tablet Take 1 tablet (20 mg total) by mouth 2 (two) times daily. 06/06/22   Modena Slater, DO  Polyvinyl Alcohol-Povidone (REFRESH OP) Place 1 drop into both eyes daily as needed (dry eyes).  [provider]  SIMBRINZA 1-0.2 % SUSP Place into both eyes in the morning and at bedtime.    [provider]  pravastatin (PRAVACHOL) 20 MG tablet Take 1 tablet (20 mg total) by mouth daily. 12/23/11 02/11/12  Leodis Sias, MD      Allergies    Codeine, Pravastatin, and Latex    Review of Systems   Review of Systems  All other systems reviewed and are negative.   Physical Exam Updated Vital Signs BP 139/66   Pulse (!) 55   Temp 98.8 F (37.1 C) (Oral)   Resp 11   SpO2 99%  Physical Exam Vitals and nursing note reviewed.  Constitutional:      General: She is not in acute  distress.    Appearance: Normal appearance.  HENT:     Head: Normocephalic and atraumatic.  Eyes:     General:        Right eye: No discharge.        Left eye: No discharge.  Cardiovascular:     Rate and Rhythm: Normal rate and regular rhythm.     Heart sounds: No murmur heard.    No friction rub. No gallop.  Pulmonary:     Effort: Pulmonary effort is normal.     Breath sounds: Normal breath sounds.  Abdominal:     General: Bowel sounds are normal.     Palpations: Abdomen is soft.     Comments: Diffusely tender to palpation to the abdomen, no rebound, rigidity, guarding  Skin:    General: Skin is warm and dry.     Capillary Refill: Capillary refill takes less than 2 seconds.  Neurological:     Mental Status: She is alert and oriented to person, place, and time.  Psychiatric:        Mood and Affect: Mood normal.        Behavior: Behavior normal.     ED Results / Procedures / Treatments   Labs (all labs ordered are listed, but only abnormal results are displayed) Labs Reviewed  COMPREHENSIVE METABOLIC PANEL - Abnormal; Notable for the following components:      Result Value   Potassium 3.4 (*)    Chloride 97 (*)    BUN 29 (*)    Creatinine, Ser 2.75 (*)    Calcium >15.0 (*)    GFR, Estimated 18 (*)    All other components within normal limits  CBC - Abnormal; Notable for the following components:   RBC 3.34 (*)    Hemoglobin 10.2 (*)    HCT 30.5 (*)    All other components within normal limits  LIPASE, BLOOD  URINALYSIS, ROUTINE W REFLEX MICROSCOPIC  PARATHYROID HORMONE, INTACT (NO CA)  CALCIUM, IONIZED  PTH-RELATED PEPTIDE    EKG EKG Interpretation Date/Time:  Tuesday December 06 2022 19:25:33 EST Ventricular Rate:  66 PR Interval:  167 QRS Duration:  155 QT Interval:  409 QTC Calculation: 429 R Axis:   -27  Text Interpretation: Sinus rhythm Right bundle branch block Confirmed by Alvino Blood ([id number]) on 12/06/2022 7:32:08 PM  Radiology CT  ABDOMEN PELVIS WO CONTRAST  Result Date: 12/06/2022 CLINICAL DATA:  Constipation, possible bowel obstruction EXAM: CT ABDOMEN AND PELVIS WITHOUT CONTRAST TECHNIQUE: Multidetector CT imaging of the abdomen and pelvis was performed following the standard protocol without IV contrast. RADIATION DOSE REDUCTION: This exam was performed according to the departmental dose-optimization program which includes automated exposure control, adjustment of the mA and/or kV according to patient  size and/or use of iterative reconstruction technique. COMPARISON:  01/01/2010 FINDINGS: Lower chest: Calcifications along the left anterior descending coronary artery noted, indicating atheromatous vascular disease. Heart size within normal limits. Hepatobiliary: Small depending gallstones in the gallbladder. Otherwise unremarkable. Pancreas: Mildly atrophic pancreas. Punctate calcification pancreatic tail is stable and likely due to remote inflammation. Spleen: Unremarkable Adrenals/Urinary Tract: Unremarkable Stomach/Bowel: Mild prominence of stool in the colon although with some sparing of the sigmoid colon and rectum, appearance favoring constipation. No obvious narrowing or constricting lesion the transition in caliber, although please note that noncontrast CT is not a sensitive way to assess for colon tumor. No dilated small bowel. Incidental small diverticulum of the terminal ileum. Prior appendectomy. Vascular/Lymphatic: Atherosclerosis is present, including aortoiliac atherosclerotic disease. Reproductive: Unremarkable Other: No supplemental non-categorized findings. Musculoskeletal: Substantial multilevel spondylosis and degenerative disc disease contributing to multilevel impingement. Mild degenerative hip arthropathy bilaterally. IMPRESSION: 1. Mild prominence of stool in the colon although with some sparing of the sigmoid colon and rectum, appearance favoring constipation. No obvious narrowing or constricting lesion the  transition in caliber, although please note that noncontrast CT is not a sensitive way to assess for colon tumor. 2. Cholelithiasis. 3. Substantial multilevel spondylosis and degenerative disc disease contributing to multilevel impingement. 4. Mild degenerative hip arthropathy bilaterally. 5. Aortic atherosclerosis. Aortic Atherosclerosis (ICD10-I70.0). Electronically Signed   By: Gaylyn Rong M.D.   On: 12/06/2022 21:02    Procedures .Critical Care  Performed by: Olene Floss, PA-C Authorized by: Olene Floss, PA-C   Critical care provider statement:    Critical care time (minutes):  35   Critical care was necessary to treat or prevent imminent or life-threatening deterioration of the following conditions:  Metabolic crisis (Critical hypercalcemia)   Critical care was time spent personally by me on the following activities:  Development of treatment plan with patient or surrogate, discussions with consultants, evaluation of patient's response to treatment, examination of patient, ordering and review of laboratory studies, ordering and review of radiographic studies, ordering and performing treatments and interventions, pulse oximetry, re-evaluation of patient's condition and review of old charts   Care discussed with: admitting provider       Medications Ordered in ED Medications  sodium chloride 0.9 % bolus 1,000 mL (0 mLs Intravenous Stopped 12/06/22 2047)  morphine (PF) 4 MG/ML injection 4 mg (4 mg Intravenous Given 12/06/22 1921)  sodium chloride 0.9 % bolus 1,000 mL (1,000 mLs Intravenous New Bag/Given 12/06/22 2049)    ED Course/ Medical Decision Making/ A&P Clinical Course as of 12/06/22 2159  Tue Dec 06, 2022  2120 Ghiles IM to admit [CP]    Clinical Course User Index [CP] Olene Floss, PA-C                                 Medical Decision Making Amount and/or Complexity of Data Reviewed Labs: ordered. Radiology:  ordered.  Risk Prescription drug management.   This patient is a 67 y.o. female who presents to the ED for concern of abdominal pain, constipation, this involves an extensive number of treatment options, and is a complaint that carries with it a high risk of complications and morbidity. The emergent differential diagnosis prior to evaluation includes, but is not limited to,  The causes of generalized abdominal pain include but are not limited to AAA, mesenteric ischemia, appendicitis, diverticulitis, DKA, gastritis, gastroenteritis, AMI, nephrolithiasis, pancreatitis, peritonitis, adrenal insufficiency,lead poisoning, iron toxicity,  intestinal ischemia, constipation, UTI,SBO/LBO, splenic rupture, biliary disease, IBD, IBS, PUD, or hepatitis --higher concern for SBO due to patient report of no bowel movement or gas since yesterday, with bowel movement for longer, patient reports for around 1 week. This is not an exhaustive differential.   Past Medical History / Co-morbidities / Social History: Hypertension, GERD, hyperlipidemia, diabetes, obesity  Additional history: Chart reviewed. Pertinent results include: Reviewed outpatient family medicine, GI visits including paper record of recent colonoscopy, endoscopy  Physical Exam: Physical exam performed. The pertinent findings include: Mild tenderness to palpation diffusely through the abdomen, otherwise well-appearing other than some hypertension, blood pressure 157/73 most recently  Lab Tests: I ordered, and personally interpreted labs.  The pertinent results include: CBC notable for mild anemia, hemoglobin 10.2, CMP notable for new AKI, creatinine 2.75, critical hypercalcemia, calcium greater than 15, no anion gap.   Imaging Studies: I ordered imaging studies including CT abdomen pelvis without contrast. I independently visualized and interpreted imaging which showed constipation, without any other acute intra-abdominal abnormality, or bowel  obstruction. I agree with the radiologist interpretation.   Cardiac Monitoring:  The patient was maintained on a cardiac monitor.  My attending physician Dr. Suezanne Jacquet viewed and interpreted the cardiac monitored which showed an underlying rhythm of: NSR, RBBB. I agree with this interpretation.   Medications: I ordered medication including several fluid bolus due to new significant AKI, morphine for pain   Consultations Obtained: I requested consultation with the hospitalist on internal medicine service spoke with Dr. Rayvon Char,  and discussed lab and imaging findings as well as pertinent plan - they recommend: Admission for critical hypercalcemia as well as new AKI, unknown etiology for her hypercalcemia, no previous history of parathyroid disease, no known diagnosis of multiple myeloma   Disposition: After consideration of the diagnostic results and the patients response to treatment, I feel that patient would benefit from admission for AKI, hypercalcemia as discussed above.   I discussed this case with my attending physician Dr. Suezanne Jacquet who cosigned this note including patient's presenting symptoms, physical exam, and planned diagnostics and interventions. Attending physician stated agreement with plan or made changes to plan which were implemented.    Final Clinical Impression(s) / ED Diagnoses Final diagnoses:  None    Rx / DC Orders ED Discharge Orders     None         [place] 12/06/22 2159    Lonell Grandchild, MD 12/06/22 [phone]

## 2022-12-06 NOTE — ED Notes (Signed)
ED TO INPATIENT HANDOFF REPORT  ED Nurse Name and Phone #:  Marcello Moores [phone]  S Name/Age/Gender [name] 67 y.o. female Room/Bed: 008C/008C  Code Status   Code Status: Full Code  Home/SNF/Other Home Patient oriented to: self, place, time, and situation Is this baseline? Yes   Triage Complete: Triage complete  Chief Complaint Hypercalcemia [E83.52]  Triage Note Pt sent by gastroenterologist for further evaluation of constipation, possible bowel obstruction; LBM x 1 week ago; endorses N/V x 1 week; endorsing low back and abdominal pain      Allergies Allergies  Allergen Reactions   Codeine Itching and Swelling    swelling in throat   Pravastatin Other (See Comments)    cramps   Latex Rash    Level of Care/Admitting Diagnosis ED Disposition     ED Disposition  Admit   Condition  --   Comment  Hospital Area: [facility] [[id number]]  Level of Care: Telemetry Medical [104]  May admit patient to [facility] or [facility] if equivalent level of care is available:: No  Covid Evaluation: Asymptomatic - no recent exposure (last 10 days) testing not required  Diagnosis: Hypercalcemia [275.42.ICD-9-CM]  Admitting Physician: Miguel Aschoff [[id number]]  Attending Physician: Miguel Aschoff [[id number]]  Certification:: I certify this patient will need inpatient services for at least 2 midnights  Expected Medical Readiness: 12/09/2022          B Medical/Surgery History Past Medical History:  Diagnosis Date   Arthritis    Chronic normocytic anemia    CKD (chronic kidney disease) stage 3, GFR 30-59 ml/min (HCC) 02/02/2013   Diabetes mellitus type II, controlled (HCC) 06/17/2009   Diabetic ulcer of both feet (HCC) 01/07/2010   Qualifier: Diagnosis of  By: [name] MD, Amanjot     Essential hypertension 06/17/2009   GERD (gastroesophageal reflux disease)    Glaucoma    H/O hiatal hernia    Hardware complicating wound infection (HCC) 12/28/2020    Hyperlipidemia    MRSA (methicillin resistant Staphylococcus aureus)    Obstructive sleep apnea 05/26/2010   Polymicrobial bacterial infection 12/28/2020   Past Surgical History:  Procedure Laterality Date   ACHILLES TENDON LENGTHENING Left 11/06/2014   Procedure: LEFT ACHILLES TENDON LENGTHENING, ;  Surgeon: Toni Arthurs, MD;  Location: [facility] OR;  Service: Orthopedics;  Laterality: Left;   APPENDECTOMY  1975   FOOT ARTHRODESIS Left 11/06/2014   Procedure: TALONAVICULAR CANCELLOUS CUBOID ARTHRODESIS WITH CORRECTIVE OSTEOTOMY ;  Surgeon: Toni Arthurs, MD;  Location: [facility] OR;  Service: Orthopedics;  Laterality: Left;   FOOT ARTHRODESIS Right 05/14/2020   Procedure: Right heelcord lengthening, subtalar and talonavicular arthrodesis, talus exostectomy, talus corrective osteotomy;  Surgeon: Toni Arthurs, MD;  Location: [facility] OR;  Service: Orthopedics;  Laterality: Right;   HARDWARE REMOVAL Left 02/19/2015   Procedure: REMOVAL OF LEFT FOOT EXTERNAL FIXATOR, CASTING OF LEFT ANKLE ;  Surgeon: Toni Arthurs, MD;  Location: [facility];  Service: Orthopedics;  Laterality: Left;   HARDWARE REMOVAL Right 12/17/2020   Procedure: Removal of deep implant right foot;  Surgeon: Toni Arthurs, MD;  Location: [facility] OR;  Service: Orthopedics;  Laterality: Right;   I & D EXTREMITY Left 2011   "ulcer on my foot got infected" (01/30/2013)   I & D EXTREMITY Right 05/06/2021   Procedure: Irrigation and excisional debridement of right ankle abcess;  Surgeon: Toni Arthurs, MD;  Location: [facility];  Service: Orthopedics;  Laterality: Right;   MULTIPLE TOOTH EXTRACTIONS Bilateral    OOPHORECTOMY Bilateral 1975-1976     A IV Location/Drains/Wounds Patient Lines/Drains/Airways Status     Active Line/Drains/Airways     Name Placement date Placement time Site Days   Peripheral IV 12/06/22 20 G Right Antecubital 12/06/22  1636  Antecubital  less than 1   PICC Single Lumen 12/18/20 Right Basilic 44 cm  0 cm 12/18/20  1706  Basilic  718   Wound 01/30/13 Diabetic ulcer Heel Left 01/30/13  1721  Heel  3597   EXTERNAL FIXATOR 11/06/14  1028  Ankle  2952            Intake/Output Last 24 hours  Intake/Output Summary (Last 24 hours) at 12/06/2022 2250 Last data filed at 12/06/2022 2208 Gross per 24 hour  Intake 2000 ml  Output --  Net 2000 ml    Labs/Imaging Results for orders placed or performed during the hospital encounter of 12/06/22 (from the past 48 hour(s))  Lipase, blood     Status: None   Collection Time: 12/06/22  4:07 PM  Result Value Ref Range   Lipase 27 11 - 51 U/L    Comment: Performed at [facility], [address] [place], [place] [address]  Comprehensive metabolic panel     Status: Abnormal   Collection Time: 12/06/22  4:07 PM  Result Value Ref Range   Sodium 138 135 - 145 mmol/L   Potassium 3.4 (L) 3.5 - 5.1 mmol/L   Chloride 97 (L) 98 - 111 mmol/L   CO2 31 22 - 32 mmol/L   Glucose, Bld 81 70 - 99 mg/dL    Comment: Glucose reference range applies only to samples taken after fasting for at least 8 hours.   BUN 29 (H) 8 - 23 mg/dL   Creatinine, Ser 1.32 (H) 0.44 - 1.00 mg/dL   Calcium >44.0 (HH) 8.9 - 10.3 mg/dL    Comment: CRITICAL RESULT CALLED TO, READ BACK BY AND VERIFIED WITH T. Patterson @1800  12/06/22 , Dabdee, T.   Total Protein 7.1 6.5 - 8.1 g/dL   Albumin 4.0 3.5 - 5.0 g/dL   AST 22 15 - 41 U/L   ALT 10 0 - 44 U/L   Alkaline Phosphatase 123 38 - 126 U/L   Total Bilirubin 0.8 <1.2 mg/dL   GFR, Estimated 18 (L) >60 mL/min    Comment: (NOTE) Calculated using the CKD-EPI Creatinine Equation (2021)    Anion gap 10 5 - 15    Comment: Performed at [facility], [address], [place], [place] [address]  CBC     Status: Abnormal   Collection Time: 12/06/22  4:07 PM  Result Value Ref Range   WBC 6.3 4.0 - 10.5 K/uL   RBC 3.34 (L) 3.87 - 5.11 MIL/uL   Hemoglobin 10.2 (L) 12.0 - 15.0 g/dL   HCT 53.6 (L) 64.4 - 03.4 %   MCV 91.3 80.0  - 100.0 fL   MCH 30.5 26.0 - 34.0 pg   MCHC 33.4 30.0 - 36.0 g/dL   RDW 74.2 59.5 - 63.8 %   Platelets 203 150 - 400 K/uL   nRBC 0.0 0.0 - 0.2 %    Comment: Performed at [facility], [address] [place], [place] [address]   CT ABDOMEN PELVIS WO CONTRAST  Result Date: 12/06/2022 CLINICAL DATA:  Constipation, possible bowel obstruction EXAM: CT ABDOMEN AND PELVIS WITHOUT CONTRAST TECHNIQUE: Multidetector CT imaging of the abdomen and pelvis was  performed following the standard protocol without IV contrast. RADIATION DOSE REDUCTION: This exam was performed according to the departmental dose-optimization program which includes automated exposure control, adjustment of the mA and/or kV according to patient size and/or use of iterative reconstruction technique. COMPARISON:  01/01/2010 FINDINGS: Lower chest: Calcifications along the left anterior descending coronary artery noted, indicating atheromatous vascular disease. Heart size within normal limits. Hepatobiliary: Small depending gallstones in the gallbladder. Otherwise unremarkable. Pancreas: Mildly atrophic pancreas. Punctate calcification pancreatic tail is stable and likely due to remote inflammation. Spleen: Unremarkable Adrenals/Urinary Tract: Unremarkable Stomach/Bowel: Mild prominence of stool in the colon although with some sparing of the sigmoid colon and rectum, appearance favoring constipation. No obvious narrowing or constricting lesion the transition in caliber, although please note that noncontrast CT is not a sensitive way to assess for colon tumor. No dilated small bowel. Incidental small diverticulum of the terminal ileum. Prior appendectomy. Vascular/Lymphatic: Atherosclerosis is present, including aortoiliac atherosclerotic disease. Reproductive: Unremarkable Other: No supplemental non-categorized findings. Musculoskeletal: Substantial multilevel spondylosis and degenerative disc disease contributing to multilevel impingement.  Mild degenerative hip arthropathy bilaterally. IMPRESSION: 1. Mild prominence of stool in the colon although with some sparing of the sigmoid colon and rectum, appearance favoring constipation. No obvious narrowing or constricting lesion the transition in caliber, although please note that noncontrast CT is not a sensitive way to assess for colon tumor. 2. Cholelithiasis. 3. Substantial multilevel spondylosis and degenerative disc disease contributing to multilevel impingement. 4. Mild degenerative hip arthropathy bilaterally. 5. Aortic atherosclerosis. Aortic Atherosclerosis (ICD10-I70.0). Electronically Signed   By: Gaylyn Rong M.D.   On: 12/06/2022 21:02    Pending Labs Unresulted Labs (From admission, onward)     Start     Ordered   12/06/22 2202  HIV Antibody (routine testing w rflx)  (HIV Antibody (Routine testing w reflex) panel)  Once,   R        12/06/22 2213   12/06/22 2200  VITAMIN D 25 Hydroxy (Vit-D Deficiency, Fractures)  Once,   R        12/06/22 2213   12/06/22 2159  PTH-related peptide  Once,   URGENT        12/06/22 2158   12/06/22 1810  Parathyroid hormone, intact (no Ca)  Once,   URGENT        12/06/22 1809   12/06/22 1810  Calcium, ionized  Once,   STAT        12/06/22 1809   12/06/22 1606  Urinalysis, Routine w reflex microscopic -Urine, Clean Catch  Once,   URGENT       Question:  Specimen Source  Answer:  Urine, Clean Catch   12/06/22 1605            Vitals/Pain Today's Vitals   12/06/22 1957 12/06/22 2100 12/06/22 2208 12/06/22 2230  BP:  139/66  (!) 147/103  Pulse:  (!) 55  (!) 110  Resp:  11  18  Temp:   98.7 F (37.1 C)   TempSrc:   Oral   SpO2:  99%  93%  PainSc: 5        Isolation Precautions No active isolations  Medications Medications  enoxaparin (LOVENOX) injection 40 mg (40 mg Subcutaneous Given 12/06/22 2249)  sodium chloride flush (NS) 0.9 % injection 3 mL (has no administration in time range)  calcitonin (MIACALCIN) injection  400 Units (has no administration in time range)  lactated ringers infusion (has no administration in time range)  polyethylene glycol (MIRALAX /  GLYCOLAX) packet 17 g (has no administration in time range)  docusate sodium (COLACE) capsule 100 mg (has no administration in time range)  atorvastatin (LIPITOR) tablet 40 mg (40 mg Oral Given 12/06/22 2249)  pantoprazole (PROTONIX) EC tablet 20 mg (20 mg Oral Given 12/06/22 2249)  sodium chloride 0.9 % bolus 1,000 mL (0 mLs Intravenous Stopped 12/06/22 2047)  morphine (PF) 4 MG/ML injection 4 mg (4 mg Intravenous Given 12/06/22 1921)  sodium chloride 0.9 % bolus 1,000 mL (0 mLs Intravenous Stopped 12/06/22 2208)  potassium chloride SA (KLOR-CON M) CR tablet 40 mEq (40 mEq Oral Given 12/06/22 2248)    Mobility walks with device     Focused Assessments     R Recommendations: See Admitting Provider Note  Report given to:   Additional Notes:

## 2022-12-06 NOTE — ED Notes (Signed)
Provider notified of critical Calcium result >15

## 2022-12-06 NOTE — ED Provider Triage Note (Signed)
Emergency Medicine Provider Triage Evaluation Note  KAMARIONA DRECHSLER , a 67 y.o. female  was evaluated in triage.  Pt complains of abdominal pain, nausea, vomiting, last bowel movement 1 week ago, last passage of gas was yesterday.  Was told to come to the ED due to concern for possible bowel obstruction.  Review of Systems  Positive: Abdominal pain, nausea, vomiting Negative:   Physical Exam  BP 129/67   Pulse 77   Temp 98.4 F (36.9 C) (Oral)   Resp 16   SpO2 99%  Gen:   Awake, no distress   Resp:  Normal effort  MSK:   Moves extremities without difficulty  Other:  Diffuse tenderness to palpation of the abdomen, no rebound, rigidity, guarding  Medical Decision Making  Medically screening exam initiated at 4:12 PM.  Appropriate orders placed.  NIKOLLE TANKS was informed that the remainder of the evaluation will be completed by another provider, this initial triage assessment does not replace that evaluation, and the importance of remaining in the ED until their evaluation is complete.  Workup initiated in triage    Olene Floss, [place] 12/06/22 1616

## 2022-12-06 NOTE — ED Triage Notes (Signed)
Pt sent by gastroenterologist for further evaluation of constipation, possible bowel obstruction; LBM x 1 week ago; endorses N/V x 1 week; endorsing low back and abdominal pain

## 2022-12-06 NOTE — Hospital Course (Addendum)
[name] is a 67 y.o. with a history of hypertension, GERD, T2DM, and hyperlipidemia presenting to the ED for nausea, vomiting, and abdominal discomfort was admitted to the Internal Medicine Teaching Service on 12/06/2022 for hypercalcemia and AKI.   Hypercalcemia Calcium on admission > 15 accompanied by symptoms of nausea, vomiting, abdominal discomfort, and severe constipation. CT abdomen pelvis did not show concern for blockage, did show mild prominence of stool in the colon although with some sparing of the sigmoid colon and rectum, appearance favoring constipation.    Underlying cause may be excessive use of calcium carbonate (Tums, 2-4 tablets daily) with concomitant vitamin D supplementation. Initial differential included primary hyperparathyroidism, malignancy, sarcoidosis, and familial/genetic causes of hypercalcemia. Family history of breast cancer and lung cancer, although patient's CXR not concerning for lesions.    Patient's Ca improved to 13.4 following aggressive IV fluid hydration, 250 mL/h x 10 hours and calcitonin. Bisphosphonate therapy held because of AKI. Vitamin D 89.54 WNL, Calcitriol 66.0 WNL, PTH 6 (suppressed), phosphorus <1.0, Ionized calcium 8.2 (elevated). Patient's Ca improved to 9.2 following IV lasix 20 mg and PO hydration 11/21. Remained stable thereafter with calcium 8.4 and corrected calcium 9.0 on day of discharge. Low PTH made primary hyperthyroidism less likely. High calcium levels more likely related to patient's exogenous calcium use. Still waiting on PTH-rp lab result to help determine additional workup to rule out malignancy, as patient does have history of breast and lung cancer. Would indicate need to rule out multiple myeloma as well.    Obstipation Chronic constipation with worsening likely due to hypercalcemia and GLP-1 use. CT shows lots of stool in the colon.  Rectal exam normal, scant stool in rectal vault. Patient started on Miralax and  soapsuds  enema for relief of colonic stool burden. Maximized bowel regimen with Miralax BID, Senokot 1 tablet BID and enema PRN. Two hard bowel movements overnight 11/22 to 11/23, painful. BM 11/23. 11/24, and 11/25 softer and closer to patient's normal BM. Enemas effective. Discharged patient with PRN bowel regimen.   GERD Emesis  History of hiatal hernia. Had upper endoscopy on 10/09 with Dr. Dulce Sellar at [facility] for persistent nausea and vomiting that was unremarkable for etiology of symptoms. Resumed her home pantoprazole 20 mg daily. During admission patient endorsed some nausea and 1 episode of vomiting with regurgitated food. Also endorsed intermittent coughing with food and feeling like something is stuck. Barium esophagram 11/22 with poor primary peristalsis in her lower esophagus and redemonstrated her hiatal hernia. Not consistent with achalasia, strictures, or signs of Barrett's esophagus. Nausea and vomiting could be due to her food being stuck in her lower esophagus and not emptying to her stomach. Counseled to eat slower and sit straight up while eating and for least 30 minutes after eating. She will need outpatient GI follow-up.   Fall  Patient fell 11/23. CT head without fracture or intracranial abnormality, showed advanced chronic degenerative disc and degenerative joint disease. CT cervical spine without fracture and advanced degenerative disk and joint disease. Orthostatic vitals positive for orthostatic hypotension (lying 138/66, HR 80; sitting 130/60, HR 82; standing 0 minutes 116/71, HR 97; standing 3 minutes 120/64, HR 100). Encouraged patient to stay well hydrated, given 1L LR on day of discharge. Patient with some lightheadedness that improves with rest. Held antihypertensives, will need to check orthostatics outpatient before resuming.     AKI AKI with Cr 2.75 on admission on CKD 3. Suspect this is due to calcium induced diuresis and dehydration.  Monitored on daily RFP. Serum Cr on day of  discharge 2.09, up from 1.77 day prior. Patient given 1 L LR bolus. Asked patient to return for lab visit on 12/02 to check RFP. Held lisinopril due to AKI, continue holding until outpatient visit.   Hypokalemia- resolved K 3.3, repleted 11/24 with PO potassium. Potassium 4.0 on day of discharge.   Hypophosphatemia- resolved Critical lab phosphorus <1.0 11/21. Repleted with IV phosphorus. PM phosphorus improved to 2.3. Repleted again with IV phosphorus 11/22, improved to 2.6 and remains WNL at 3.1 on day of discharge.   Diabetes- chronic Well-controlled at present, last A1c was 5.3% 2 months ago. Glucose is normal this evening. Did not start insulin. Obstipation resolved, did not appear to be due to slow gastric emptying. Patient can resume GLP-1 agonist and Metformin at discharge.    OSA- chronic  Chronic. Continued CPAP that she uses outpatient.

## 2022-12-06 NOTE — H&P (Incomplete)
Date: 12/07/2022               Patient Name:  [name] MRN: [id number]  DOB: [birth year] Age / Sex: 67 y.o., female   PCP: Modena Slater, DO         Medical Service: Internal Medicine Teaching Service         Attending Physician: Dr. Mayford Knife, Dorene Ar, MD      First Contact: Dr. Monna Fam, MD Pager: [phone]    Second Contact: Dr. Rocky Morel, DO  Pager: [phone]         After Hours (After 5p/  First Contact Pager: [phone]  weekends / holidays): Second Contact Pager: [phone]   SUBJECTIVE   Chief Complaint: Nausea, vomiting, abdominal discomfort   History of Present Illness: [name] is a 67 YO F with a history of hypertension, GERD, T2DM, and hyperlipidemia presenting to the ED for nausea, vomiting, and abdominal discomfort. Over the past three weeks, she has been experiencing persistent nausea, vomiting, abdominal discomfort, and constipation. She was seen by her gastroenterologist Dr. Dulce Sellar who recommended evaluation in the hospital for obstipation with concern for bowel obstruction. She has been taking Ozempic for the last several months and has lost roughly 60 pounds in conjunction with an improved diet and exercise. She recently had a colonoscopy and upper endoscopy for persistent nausea. She last vomited on Sunday but prior had been vomiting almost every day. She says the vomit was initially green-appearing. She has tried over the counter remedies for her constipation which have been unsuccessful. She is unsure of when she last passed a stool but is has been over a week. She says she has been taking a lot of Tums recently as well to help relieve her constipation. She has not been passing gas, and her last gas passage was on Sunday. She also endorses heart palpations with fluttering and some back pain. She denies any other signs or symptoms.   ED Course: Lipase WNL. Hypokalemic at 3.4. Creatinine elevated at 2.75 with baseline ~1.40. Hypercalcemic with  level >15 mg/dL. GFR decreased to 18. Vitamin D high-normal at 89.54. PTH-rp, PTH, ionized calcium pending. Received CT abdomen WO contrast.   Meds:  Current Meds  Medication Sig   acetaminophen (TYLENOL) 500 MG tablet Take 500-1,000 mg by mouth every 6 (six) hours as needed for moderate pain or headache.   Ascorbic Acid (VITAMIN C) 1000 MG tablet Take 1,000 mg by mouth daily. Rose hips   aspirin EC 81 MG tablet Take 81 mg by mouth daily. Swallow whole.   atorvastatin (LIPITOR) 40 MG tablet Take 1 tablet (40 mg total) by mouth daily.   Biotin 5000 MCG CAPS Take 5,000 mcg by mouth daily.   CALCIUM PO Take 1 tablet by mouth daily.   Cholecalciferol (VITAMIN D) 50 MCG (2000 UT) tablet Take 2,000 Units by mouth daily.   cycloSPORINE (RESTASIS) 0.05 % ophthalmic emulsion Place 1 drop into both eyes 2 (two) times daily. 8 am and 1700   Emollient (BAG BALM) OINT Apply to feet twice daily   ferrous sulfate 325 (65 FE) MG tablet Take 1 tablet every other day with breakfast   lisinopril (ZESTRIL) 20 MG tablet TAKE 1 TABLET BY MOUTH DAILY   metFORMIN (GLUCOPHAGE-XR) 500 MG 24 hr tablet Take 1 tablet (500 mg total) by mouth 2 (two) times daily with a meal.   Multiple Vitamin (MULTIVITAMIN) tablet Take 1 tablet by mouth daily. 50 plus   Netarsudil-Latanoprost (ROCKLATAN) 0.02-0.005 %  SOLN Place 1 drop into both eyes daily at 10 pm.   OZEMPIC, 0.25 OR 0.5 MG/DOSE, 2 MG/3ML SOPN DIAL AND INJECT UNDER THE SKIN 0.5 MG WEEKLY   pantoprazole (PROTONIX) 20 MG tablet Take 1 tablet (20 mg total) by mouth 2 (two) times daily.   Polyvinyl Alcohol-Povidone (REFRESH OP) Place 1 drop into both eyes daily as needed (dry eyes).   SIMBRINZA 1-0.2 % SUSP Place into both eyes in the morning and at bedtime.    Past Medical History  Past Surgical History:  Procedure Laterality Date   ACHILLES TENDON LENGTHENING Left 11/06/2014   Procedure: LEFT ACHILLES TENDON LENGTHENING, ;  Surgeon: Toni Arthurs, MD;  Location: [facility] OR;   Service: Orthopedics;  Laterality: Left;   APPENDECTOMY  1975   FOOT ARTHRODESIS Left 11/06/2014   Procedure: TALONAVICULAR CANCELLOUS CUBOID ARTHRODESIS WITH CORRECTIVE OSTEOTOMY ;  Surgeon: Toni Arthurs, MD;  Location: [facility] OR;  Service: Orthopedics;  Laterality: Left;   FOOT ARTHRODESIS Right 05/14/2020   Procedure: Right heelcord lengthening, subtalar and talonavicular arthrodesis, talus exostectomy, talus corrective osteotomy;  Surgeon: Toni Arthurs, MD;  Location: [facility] OR;  Service: Orthopedics;  Laterality: Right;   HARDWARE REMOVAL Left 02/19/2015   Procedure: REMOVAL OF LEFT FOOT EXTERNAL FIXATOR, CASTING OF LEFT ANKLE ;  Surgeon: Toni Arthurs, MD;  Location: [facility];  Service: Orthopedics;  Laterality: Left;   HARDWARE REMOVAL Right 12/17/2020   Procedure: Removal of deep implant right foot;  Surgeon: Toni Arthurs, MD;  Location: [facility] OR;  Service: Orthopedics;  Laterality: Right;   I & D EXTREMITY Left 2011   "ulcer on my foot got infected" (01/30/2013)   I & D EXTREMITY Right 05/06/2021   Procedure: Irrigation and excisional debridement of right ankle abcess;  Surgeon: Toni Arthurs, MD;  Location: [facility];  Service: Orthopedics;  Laterality: Right;    MULTIPLE TOOTH EXTRACTIONS Bilateral    OOPHORECTOMY Bilateral 1975-1976   Social:  Social History   Socioeconomic History   Marital status: Single    Spouse name: Not on file   Number of children: Not on file   Years of education: Not on file   Highest education level: Not on file  Occupational History   Occupation: Disabled  Tobacco Use   Smoking status: Former    Current packs/day: 0.00    Types: Cigarettes    Start date: 12/22/1972    Quit date: 12/22/1972    Years since quitting: 49.9   Smokeless tobacco: Never  Vaping Use   Vaping status: Never Used  Substance and Sexual Activity   Alcohol use: No   Drug use: No   Sexual activity: Not Currently  Other Topics Concern   Not on file   Social History Narrative   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   Smoked when she was a teenager   no alcohol   no Drugs      Current Social History 10/26/2018        Patient lives on the main level of a 3 floor boarding house with 3 other tenants. There are 3 steps with handrails up to the entrance the patient uses.       Patient's method of transportation is personal car.      The highest level of education was Bachelor's Degree.      The patient currently disabled.      Identified important Relationships are "My cousins, [name]  and Lianne Moris, and my dear friend Mliss Sax."      Pets : None       Interests / Fun: "I love to read, write, photography: go around [place] taking photos of the [address], Pop[employer]Art, Art Museums, cooking, hanging out with friends, several ministries with my Church, especially working with the youth."      Current Stressors: "Worrying about someone to care for me if anything happens to me. 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Ducatte, BSN, RN-BC          Social Determinants of Health   Financial Resource Strain: Low Risk  (10/26/2022)   Overall Financial Resource Strain (CARDIA)    Difficulty of Paying Living Expenses: Not very hard  Food Insecurity: No Food Insecurity (12/07/2022)   Hunger Vital Sign    Worried About Running Out of Food in the Last Year: Never true    Ran Out of Food in the Last Year: Never true  Transportation Needs: No Transportation Needs (12/07/2022)   PRAPARE - Administrator, Civil Service (Medical): No    Lack of Transportation (Non-Medical): No  Physical Activity: Inactive (10/26/2022)   Exercise Vital Sign    Days of Exercise per Week: 0 days    Minutes of Exercise per Session: 0 min  Stress: No Stress Concern Present (10/26/2022)   [employer] of Occupational Health  - Occupational Stress Questionnaire    Feeling of Stress : Only a little  Social Connections: Moderately Integrated (10/26/2022)   Social Connection and Isolation Panel [NHANES]    Frequency of Communication with Friends and Family: More than three times a week    Frequency of Social Gatherings with Friends and Family: Three times a week    Attends Religious Services: More than 4 times per year    Active Member of Clubs or Organizations: Yes    Attends Banker Meetings: More than 4 times per year    Marital Status: Never married  Intimate Partner Violence: Not At Risk (12/07/2022)   Humiliation, Afraid, Rape, and Kick questionnaire    Fear of Current or Ex-Partner: No    Emotionally Abused: No    Physically Abused: No    Sexually Abused: No    Family History:  Family History  Problem Relation Age of Onset   Diabetes Mother    Hypertension Mother    Dementia Mother    Heart disease Father    Stroke Father    Diabetes Father    Hypertension Father    Gout Father    Deep vein thrombosis Father    Breast cancer Cousin    Breast cancer Cousin     Allergies: Allergies as of 12/06/2022 - Review Complete 12/06/2022  Allergen Reaction Noted   Codeine Itching and Swelling 06/17/2009   Pravastatin Other (See Comments) 03/09/2012   Latex Rash 06/23/2016    Review of Systems: A complete ROS was negative except as per HPI.   OBJECTIVE:   Physical Exam Constitutional:      Appearance: Normal appearance.  HENT:     Head: Normocephalic and atraumatic.  Cardiovascular:     Rate and Rhythm: Normal rate and regular rhythm.     Pulses: Normal pulses.     Heart sounds: Normal heart sounds.  Pulmonary:     Effort: Pulmonary effort is normal.  Breath sounds: Normal breath sounds.  Abdominal:     General: Abdomen is flat. Bowel sounds are normal.     Palpations: Abdomen is soft.  Neurological:     General: No focal deficit present.     Mental Status: She is alert  and oriented to person, place, and time.    Labs: CBC    Component Value Date/Time   WBC 6.3 12/06/2022 1607   RBC 3.34 (L) 12/06/2022 1607   HGB 10.2 (L) 12/06/2022 1607   HGB 11.9 02/07/2022 1438   HCT 30.5 (L) 12/06/2022 1607   HCT 36.5 02/07/2022 1438   PLT 203 12/06/2022 1607   PLT 207 02/07/2022 1438   MCV 91.3 12/06/2022 1607   MCV 88 02/07/2022 1438   MCH 30.5 12/06/2022 1607   MCHC 33.4 12/06/2022 1607   RDW 14.9 12/06/2022 1607   RDW 13.4 02/07/2022 1438   LYMPHSABS 1,512 03/04/2021 1013   LYMPHSABS 1.9 02/28/2019 1440   MONOABS 0.6 01/30/2013 1726   EOSABS 233 03/04/2021 1013   EOSABS 0.2 02/28/2019 1440   BASOSABS 38 03/04/2021 1013   BASOSABS 0.0 02/28/2019 1440     CMP     Component Value Date/Time   NA 138 12/06/2022 1607   NA 140 02/07/2022 1438   K 3.4 (L) 12/06/2022 1607   CL 97 (L) 12/06/2022 1607   CO2 31 12/06/2022 1607   GLUCOSE 81 12/06/2022 1607   BUN 29 (H) 12/06/2022 1607   BUN 17 02/07/2022 1438   CREATININE 2.75 (H) 12/06/2022 1607   CREATININE 1.49 (H) 03/04/2021 1013   CALCIUM >15.0 (HH) 12/06/2022 1607   PROT 7.1 12/06/2022 1607   ALBUMIN 4.0 12/06/2022 1607   AST 22 12/06/2022 1607   ALT 10 12/06/2022 1607   ALKPHOS 123 12/06/2022 1607   BILITOT 0.8 12/06/2022 1607   GFRNONAA 18 (L) 12/06/2022 1607   GFRNONAA 44 (L) 09/11/2013 1431   GFRAA 55 (L) 03/11/2020 1533   GFRAA 50 (L) 09/11/2013 1431    Imaging: CT Abdomen Pelvis WO Contrast  IMPRESSION: 1. Mild prominence of stool in the colon although with some sparing of the sigmoid colon and rectum, appearance favoring constipation. No obvious narrowing or constricting lesion the transition in caliber, although please note that noncontrast CT is not a sensitive way to assess for colon tumor. 2. Cholelithiasis. 3. Substantial multilevel spondylosis and degenerative disc disease contributing to multilevel impingement. 4. Mild degenerative hip arthropathy bilaterally. 5. Aortic  atherosclerosis.  EKG: personally reviewed my interpretation is sinus rhythm.   ASSESSMENT & PLAN:   Assessment & Plan by Problem: Principal Problem:   Hypercalcemia Active Problems:   Diabetes mellitus type II, controlled (HCC)   Obstructive sleep apnea   GERD (gastroesophageal reflux disease)   CKD (chronic kidney disease) stage 3, GFR 30-59 ml/min (HCC)   Hyperlipidemia   AKI (acute kidney injury) (HCC)   Kynslei Santarosa is a 67 YO F with a history of hypertension, GERD, T2DM, and hyperlipidemia presenting to the ED for nausea, vomiting, and abdominal discomfort and admitted for hypercalcemia and AKI.   #Hypercalcemia With symptoms including nausea vomiting and obstipation.  Suspect AKI is due to dehydration from calcium induced diuresis.  Underlying cause may be excessive use of calcium carbonate with concomitant vitamin D supplementation.  Also evaluating for underlying hyperparathyroidism, PTH RP secreting tumors, sarcoid.  CT abdomen pelvis without contrast did not show any masses.  Will get chest x-ray.  Aggressive IV fluid hydration, 250 mL/h x 10  hours, calcitonin.  With holding bisphosphonate because of new renal insufficiency but if kidney function recovers can add this.  #AKI On CKD 3.  Suspect this is due to calcium induced diuresis.  No signs or symptoms of obstructive or intrinsic renal disease.  Still need UA as there was lots of difficulty collecting this in the ED.  Will check BMP after IV fluid hydration.  #Diabetes Well-controlled at present, last A1c was around 5.  Glucose is normal this evening.  Will defer starting insulin for this person.  Will probably need to stop the GLP-1 agonist at least until her obstipation resolves.  #Obstipation From hypercalcemia.  CT shows lots of stool in the colon.  Rectal exam normal, scant stool in rectal vault.  Will start MiraLAX.  Soapsuds enema for relief of colonic stool burden.  #OSA Continue CPAP that she uses  outpatient.  #GERD Had upper endoscopy for persistent nausea and vomiting that was unremarkable for etiology of the symptoms.  Will restart her outpatient PPI.  Diet: Normal VTE: Enoxaparin IVF: LR, 250 mL/hr x 10 hours Code: Full  Prior to Admission Living Arrangement: Home Anticipated Discharge Location: Home Barriers to Discharge: AKI, hypercalcemia   Dispo: Admit patient to Observation with expected length of stay less than 2 midnights.  Signed: Morrie Sheldon, MD Internal Medicine Resident PGY-1  12/07/2022, 12:53 AM

## 2022-12-07 ENCOUNTER — Inpatient Hospital Stay (HOSPITAL_COMMUNITY): Payer: 59

## 2022-12-07 ENCOUNTER — Other Ambulatory Visit: Payer: Self-pay

## 2022-12-07 ENCOUNTER — Encounter (HOSPITAL_COMMUNITY): Payer: Self-pay | Admitting: Internal Medicine

## 2022-12-07 DIAGNOSIS — E119 Type 2 diabetes mellitus without complications: Secondary | ICD-10-CM | POA: Diagnosis not present

## 2022-12-07 DIAGNOSIS — K5909 Other constipation: Secondary | ICD-10-CM | POA: Diagnosis not present

## 2022-12-07 DIAGNOSIS — N179 Acute kidney failure, unspecified: Secondary | ICD-10-CM | POA: Diagnosis not present

## 2022-12-07 DIAGNOSIS — G4733 Obstructive sleep apnea (adult) (pediatric): Secondary | ICD-10-CM

## 2022-12-07 DIAGNOSIS — K219 Gastro-esophageal reflux disease without esophagitis: Secondary | ICD-10-CM

## 2022-12-07 LAB — HIV ANTIBODY (ROUTINE TESTING W REFLEX): HIV Screen 4th Generation wRfx: NONREACTIVE

## 2022-12-07 LAB — BASIC METABOLIC PANEL
Anion gap: 8 (ref 5–15)
BUN: 26 mg/dL — ABNORMAL HIGH (ref 8–23)
CO2: 26 mmol/L (ref 22–32)
Calcium: 13.4 mg/dL (ref 8.9–10.3)
Chloride: 105 mmol/L (ref 98–111)
Creatinine, Ser: 2.26 mg/dL — ABNORMAL HIGH (ref 0.44–1.00)
GFR, Estimated: 23 mL/min — ABNORMAL LOW (ref >60)
Glucose, Bld: 70 mg/dL (ref 70–99)
Potassium: 3.6 mmol/L (ref 3.5–5.1)
Sodium: 139 mmol/L (ref 135–145)

## 2022-12-07 LAB — URINALYSIS, ROUTINE W REFLEX MICROSCOPIC
Bacteria, UA: NONE SEEN
Bilirubin Urine: NEGATIVE
Glucose, UA: NEGATIVE mg/dL
Hgb urine dipstick: NEGATIVE
Ketones, ur: 20 mg/dL — AB
Nitrite: NEGATIVE
Protein, ur: 30 mg/dL — AB
Specific Gravity, Urine: 1.016 (ref 1.005–1.030)
pH: 8 (ref 5.0–8.0)

## 2022-12-07 LAB — CBC
HCT: 29.1 % — ABNORMAL LOW (ref 36.0–46.0)
Hemoglobin: 9.6 g/dL — ABNORMAL LOW (ref 12.0–15.0)
MCH: 29.7 pg (ref 26.0–34.0)
MCHC: 33 g/dL (ref 30.0–36.0)
MCV: 90.1 fL (ref 80.0–100.0)
Platelets: 192 10*3/uL (ref 150–400)
RBC: 3.23 MIL/uL — ABNORMAL LOW (ref 3.87–5.11)
RDW: 15 % (ref 11.5–15.5)
WBC: 6.9 10*3/uL (ref 4.0–10.5)
nRBC: 0 % (ref 0.0–0.2)

## 2022-12-07 LAB — CALCIUM, IONIZED: Calcium, Ionized, Serum: 8.2 mg/dL — ABNORMAL HIGH (ref 4.5–5.6)

## 2022-12-07 MED ORDER — ACETAMINOPHEN 325 MG PO TABS
650.0000 mg | ORAL_TABLET | Freq: Four times a day (QID) | ORAL | Status: DC | PRN
  Administered 2022-12-07 – 2022-12-11 (×4): 650 mg via ORAL
  Filled 2022-12-07 (×5): qty 2

## 2022-12-07 MED ORDER — SENNOSIDES-DOCUSATE SODIUM 8.6-50 MG PO TABS
1.0000 | ORAL_TABLET | Freq: Two times a day (BID) | ORAL | Status: DC
  Administered 2022-12-07 – 2022-12-10 (×8): 1 via ORAL
  Filled 2022-12-07 (×8): qty 1

## 2022-12-07 MED ORDER — ENOXAPARIN SODIUM 30 MG/0.3ML IJ SOSY
30.0000 mg | PREFILLED_SYRINGE | INTRAMUSCULAR | Status: DC
  Administered 2022-12-07 – 2022-12-10 (×4): 30 mg via SUBCUTANEOUS
  Filled 2022-12-07 (×4): qty 0.3

## 2022-12-07 MED ORDER — LACTATED RINGERS IV BOLUS
1700.0000 mL | Freq: Once | INTRAVENOUS | Status: AC
  Administered 2022-12-07: 1700 mL via INTRAVENOUS

## 2022-12-07 MED ORDER — ORAL CARE MOUTH RINSE: 15.0000 mL | OROMUCOSAL | Status: DC | PRN

## 2022-12-07 MED ORDER — LACTATED RINGERS IV SOLN: INTRAVENOUS | Status: DC

## 2022-12-07 NOTE — Progress Notes (Cosign Needed Addendum)
Summary: [name] is a 67 y.o. with a history of hypertension, GERD, T2DM, and hyperlipidemia presenting to the ED for nausea, vomiting, and abdominal discomfort was admitted to the Internal Medicine Teaching Service for hypercalcemia and AKI.   Subjective:  Day #1. Patient clarified she has been taking about 2-4 Tums daily, which she hoped would help with her constipation, confirmed her last BM was on Sunday. When she does have a bowel movement she feels considerable relief in her back pain. Has not had a BM today, endorses some nausea with eating but no vomiting. Also endorsed some muscle aches, bone pain, feeling cold, some discomfort with urination. Reports she has had some difficult weeks, also experienced passing of a close friend, and feels like she has not been up to leaving her room or home often.   Objective:  Vital signs in last 24 hours: Vitals:   12/06/22 2345 12/06/22 2356 12/07/22 0123 12/07/22 0453  BP:  (!) 147/62  (!) 162/79  Pulse:  64 81 70  Resp:  18 19 18   Temp:  98.4 F (36.9 C)  98.3 F (36.8 C)  TempSrc:  Oral    SpO2:  100% 96% 96%  Weight: 94.8 kg     Height: 5\' 8"  (1.727 m)         Latest Ref Rng & Units 12/07/2022    4:25 AM 12/06/2022    4:07 PM 02/07/2022    2:38 PM  CBC  WBC 4.0 - 10.5 K/uL 6.9  6.3  9.7   Hemoglobin 12.0 - 15.0 g/dL 9.6  40.9  81.1   Hematocrit 36.0 - 46.0 % 29.1  30.5  36.5   Platelets 150 - 400 K/uL 192  203  207        Latest Ref Rng & Units 12/07/2022    4:25 AM 12/06/2022    4:07 PM 02/07/2022    2:38 PM  CMP  Glucose 70 - 99 mg/dL 70  81  914   BUN 8 - 23 mg/dL 26  29  17    Creatinine 0.44 - 1.00 mg/dL 7.82  9.56  2.13   Sodium 135 - 145 mmol/L 139  138  140   Potassium 3.5 - 5.1 mmol/L 3.6  3.4  4.4   Chloride 98 - 111 mmol/L 105  97  103   CO2 22 - 32 mmol/L 26  31  23    Calcium 8.9 - 10.3 mg/dL 08.6  >57.8  9.8   Total Protein 6.5 - 8.1 g/dL  7.1    Total Bilirubin <1.2 mg/dL  0.8    Alkaline Phos 38 -  126 U/L  123    AST 15 - 41 U/L  22    ALT 0 - 44 U/L  10      Physical Exam Constitutional: Patient is resting comfortably in bed in no acute distress. Required some verbal prompts to redirect conversation a few times, otherwise conversing appropriately. Trembling slightly.  CV: Regular rate and rhythm without murmurs on auscultation. Trace LE edema especially around the feet and ankles.  Pulmonary/Respiratory: Normal respiratory effort on room air.  Abdominal: Minimal bowel sounds on auscultation; diffusely tender on abdomen with moderate palpation.  Skin: Warm and dry.  Neuro: Alert and oriented, no focal deficits noted.  Psych: Normal mood and affect.   Assessment/Plan:  Principal Problem:   Hypercalcemia Active Problems:   Diabetes mellitus type II, controlled (HCC)   Obstructive sleep apnea   GERD (gastroesophageal reflux  disease)   CKD (chronic kidney disease) stage 3, GFR 30-59 ml/min (HCC)   Hyperlipidemia   AKI (acute kidney injury) (HCC)  #Hypercalcemia Calcium on admission > 15 accompanied by symptoms of nausea, vomiting, abdominal discomfort, and severe constipation. CT abdomen pelvis did not show concern for blockage, did show mild prominence of stool in the colon although with some sparing of the sigmoid colon and rectum, appearance favoring constipation.   Underlying cause may be excessive use of calcium carbonate (daily Tums) with concomitant vitamin D supplementation. Additional consideration includes primary hyperparathyroidism, malignancy, sarcoidosis, and familial/genetic causes of hypercalcemia. Family history of breast cancer and lung cancer, although patient's CXR not concerning for lesions.   Patient's Ca improved to 13.4 following aggressive IV fluid hydration, 250 mL/h x 10 hours and calcitonin. Bisphosphonate therapy held because of AKI. PTH and PTH-r labs in process. Vitamin D 89.54 WNL. Calcitriol level pending. Ionized calcium 8.2. Will follow up on  remaining labs to determine further workup, otherwise will plan on completing calcitonin doses and reassess Ca on daily labs.  - Continue IV calcitonin 400 units BID (for 4 doses) - Daily BMP - Follow up on PTH, PTH-r, calcitriol labs - Pharmacy to review all medications for any possible interactions    #AKI AKI with Cr 2.75 on admission on CKD 3. Suspect this is due to calcium induced diuresis and dehydration. Cr after initiating IV fluid hydration improved to 2.26, will continue to monitor.    #Diabetes Well-controlled at present, last A1c was 5.3% 2 months ago. Glucose is normal this evening. Will defer starting insulin for this person.  Will probably need to stop the GLP-1 agonist at least until her obstipation resolves.   #Obstipation Chronic constipation but worsening seems to be due to hypercalcemia. CT shows lots of stool in the colon.  Rectal exam normal, scant stool in rectal vault. Patient started on Miralax and  soapsuds enema for relief of colonic stool burden. Senokot 1 tablet BID added. Will consider further treatment with enema if no BM in next 24 hours.    #OSA Continue CPAP that she uses outpatient.    #GERD Had upper endoscopy for persistent nausea and vomiting that was unremarkable for etiology of the symptoms.  Resumed her home pantoprazole 20 mg daily.    Diet: Normal VTE: Enoxaparin IVF: LR, 250 mL/hr x 10 hours Code: Full  Philomena Doheny, MD, PGY-1 12/07/2022, 6:46 AM Pager: [phone] After 5pm on weekdays and 1pm on weekends: On Call pager [phone]

## 2022-12-07 NOTE — Progress Notes (Signed)
Soap sud enema given in the bathroom. No results at the moment.

## 2022-12-07 NOTE — Progress Notes (Signed)
Mobility Specialist Progress Note:   12/07/22 1030  Mobility  Activity Ambulated with assistance in hallway  Level of Assistance Contact guard assist, steadying assist  Assistive Device Front wheel walker  Distance Ambulated (ft) 200 ft  Activity Response Tolerated well  Mobility Referral Yes  $Mobility charge 1 Mobility  Mobility Specialist Start Time (ACUTE ONLY) 1030  Mobility Specialist Stop Time (ACUTE ONLY) 1050  Mobility Specialist Time Calculation (min) (ACUTE ONLY) 20 min   Pt agreeable to mobility session. Required only minG assist during ambulation d/t minor unsteadiness. Pt c/o very minor SOB, encouraged frequent OOB mobility. Pt left with all needs met.   Addison Lank Mobility Specialist Please contact via SecureChat or  Rehab office at [phone]

## 2022-12-07 NOTE — Progress Notes (Signed)
New Admission Note:  Arrival Method: Stretcher Mental Orientation: Alert and oriented x 4 Telemetry: Box 04 NSR Assessment: Completed Skin: Warm and dry IV: IVF Pain: Denies Tubes: N/A Safety Measures: Safety Fall Prevention Plan initiated.  Admission: Completed 5 M  Orientation: Patient has been orientated to the room, unit and the staff. Welcome booklet given.  Family: N/A  Orders have been reviewed and implemented. Will continue to monitor the patient. Call light has been placed within reach and bed alarm has been activated.   Guilford Shi BSN, RN  Phone Number: [phone]

## 2022-12-07 NOTE — TOC CM/SW Note (Signed)
Transition of Care [facility]) - Inpatient Brief Assessment   Patient Details  Name: [name] MRN: [id number] Date of Birth: 1955/02/26  [facility] CM/SW Contact:    Tom-Johnson, Hershal Coria, RN Phone Number: 12/07/2022, 4:27 PM   Clinical Narrative:  Patient presented to the ED with c/o Constipation, N/V, Lower Back and abdominal Pain. Admitted with Hypercalcemia with level > 15 mg/dl.   From home with two housemates. Does not have children and no siblings. Has close friends and extended family. Not employed, currently on Disability. Does not drive, friends and family transports to and from appointments.  Has a cane, walker, rollator, w/c, shower seat bsc and grab bars at home.  PCP is Modena Slater, DO and uses [facility] on [address]   No TOC needs or recommendations noted at this time.  Patient not Medically ready for discharge.  CM will continue to follow as patient progresses with care towards discharge.         Transition of Care Asessment: Insurance and Status: Insurance coverage has been reviewed Patient has primary care physician: Yes Home environment has been reviewed: Yes Prior level of function:: Modified Independent Prior/Current Home Services: No current home services Social Determinants of Health Reivew: SDOH reviewed no interventions necessary Readmission risk has been reviewed: Yes Transition of care needs: no transition of care needs at this time

## 2022-12-07 NOTE — Plan of Care (Signed)
  Problem: Education: Goal: Knowledge of General Education information will improve Description Including pain rating scale, medication(s)/side effects and non-pharmacologic comfort measures Outcome: Progressing   

## 2022-12-07 NOTE — Progress Notes (Signed)
Placed patient on CPAP for the night via auto-mode,

## 2022-12-08 DIAGNOSIS — K5909 Other constipation: Secondary | ICD-10-CM | POA: Diagnosis not present

## 2022-12-08 DIAGNOSIS — N179 Acute kidney failure, unspecified: Secondary | ICD-10-CM | POA: Diagnosis not present

## 2022-12-08 DIAGNOSIS — E119 Type 2 diabetes mellitus without complications: Secondary | ICD-10-CM | POA: Diagnosis not present

## 2022-12-08 LAB — CBC
HCT: 25 % — ABNORMAL LOW (ref 36.0–46.0)
Hemoglobin: 8.4 g/dL — ABNORMAL LOW (ref 12.0–15.0)
MCH: 29.5 pg (ref 26.0–34.0)
MCHC: 33.6 g/dL (ref 30.0–36.0)
MCV: 87.7 fL (ref 80.0–100.0)
Platelets: 166 10*3/uL (ref 150–400)
RBC: 2.85 MIL/uL — ABNORMAL LOW (ref 3.87–5.11)
RDW: 15 % (ref 11.5–15.5)
WBC: 5.9 10*3/uL (ref 4.0–10.5)
nRBC: 0 % (ref 0.0–0.2)

## 2022-12-08 LAB — RENAL FUNCTION PANEL
Albumin: 3.1 g/dL — ABNORMAL LOW (ref 3.5–5.0)
Anion gap: 7 (ref 5–15)
BUN: 20 mg/dL (ref 8–23)
CO2: 25 mmol/L (ref 22–32)
Calcium: 10.7 mg/dL — ABNORMAL HIGH (ref 8.9–10.3)
Chloride: 107 mmol/L (ref 98–111)
Creatinine, Ser: 2.02 mg/dL — ABNORMAL HIGH (ref 0.44–1.00)
GFR, Estimated: 27 mL/min — ABNORMAL LOW (ref >60)
Glucose, Bld: 85 mg/dL (ref 70–99)
Phosphorus: <1 mg/dL — CL (ref 2.5–4.6)
Potassium: 3.6 mmol/L (ref 3.5–5.1)
Sodium: 139 mmol/L (ref 135–145)

## 2022-12-08 LAB — CALCITRIOL (1,25 DI-OH VIT D): Vit D, 1,25-Dihydroxy: 66 pg/mL (ref 24.8–81.5)

## 2022-12-08 LAB — PARATHYROID HORMONE, INTACT (NO CA): PTH: 6 pg/mL — ABNORMAL LOW (ref 15–65)

## 2022-12-08 LAB — PHOSPHORUS: Phosphorus: 2.3 mg/dL — ABNORMAL LOW (ref 2.5–4.6)

## 2022-12-08 MED ORDER — BRINZOLAMIDE 1 % OP SUSP
1.0000 [drp] | Freq: Three times a day (TID) | OPHTHALMIC | Status: DC
  Administered 2022-12-08 – 2022-12-12 (×12): 1 [drp] via OPHTHALMIC
  Filled 2022-12-08: qty 10

## 2022-12-08 MED ORDER — FUROSEMIDE 10 MG/ML IJ SOLN
20.0000 mg | Freq: Once | INTRAMUSCULAR | Status: AC
  Administered 2022-12-08: 20 mg via INTRAVENOUS
  Filled 2022-12-08: qty 2

## 2022-12-08 MED ORDER — POLYVINYL ALCOHOL 1.4 % OP SOLN
1.0000 [drp] | Freq: Every day | OPHTHALMIC | Status: DC
  Administered 2022-12-09 – 2022-12-12 (×4): 1 [drp] via OPHTHALMIC
  Filled 2022-12-08: qty 15

## 2022-12-08 MED ORDER — POTASSIUM PHOSPHATES 15 MMOLE/5ML IV SOLN
45.0000 mmol | Freq: Once | INTRAVENOUS | Status: AC
  Administered 2022-12-08: 45 mmol via INTRAVENOUS
  Filled 2022-12-08: qty 15

## 2022-12-08 MED ORDER — ONDANSETRON HCL 4 MG/2ML IJ SOLN
4.0000 mg | Freq: Once | INTRAMUSCULAR | Status: AC
  Administered 2022-12-08: 4 mg via INTRAVENOUS
  Filled 2022-12-08: qty 2

## 2022-12-08 MED ORDER — POLYETHYLENE GLYCOL 3350 17 G PO PACK
17.0000 g | PACK | Freq: Two times a day (BID) | ORAL | Status: DC
  Administered 2022-12-08 – 2022-12-12 (×7): 17 g via ORAL
  Filled 2022-12-08 (×7): qty 1

## 2022-12-08 MED ORDER — SODIUM PHOSPHATES 45 MMOLE/15ML IV SOLN
15.0000 mmol | Freq: Once | INTRAVENOUS | Status: AC
Start: 2022-12-08 — End: 2022-12-08
  Administered 2022-12-08: 15 mmol via INTRAVENOUS
  Filled 2022-12-08: qty 5

## 2022-12-08 MED ORDER — BRIMONIDINE TARTRATE 0.2 % OP SOLN
1.0000 [drp] | Freq: Three times a day (TID) | OPHTHALMIC | Status: DC
  Administered 2022-12-08 – 2022-12-12 (×12): 1 [drp] via OPHTHALMIC
  Filled 2022-12-08: qty 5

## 2022-12-08 MED ORDER — FLEET ENEMA RE ENEM: 1.0000 | ENEMA | Freq: Every day | RECTAL | Status: DC | PRN

## 2022-12-08 NOTE — Progress Notes (Signed)
Mobility Specialist: Progress Note   12/08/22 1048  Mobility  Activity Ambulated with assistance in hallway  Level of Assistance Contact guard assist, steadying assist  Assistive Device Front wheel walker  Distance Ambulated (ft) 225 ft  Activity Response Tolerated well  Mobility Referral Yes  $Mobility charge 1 Mobility  Mobility Specialist Start Time (ACUTE ONLY) 0915  Mobility Specialist Stop Time (ACUTE ONLY) 0929  Mobility Specialist Time Calculation (min) (ACUTE ONLY) 14 min    Pt was agreeable to mobility session - received in bed. ModI for bed mobility, CG for STS and ambulation. No complaints. Returned to room without fault. Left in bed with all needs met, call bell in reach.   Maurene Capes Mobility Specialist Please contact via SecureChat or Rehab office at [phone]

## 2022-12-08 NOTE — Plan of Care (Signed)

## 2022-12-08 NOTE — Progress Notes (Signed)
Summary: [name] is a 67 y.o. with a history of hypertension, GERD, T2DM, and hyperlipidemia presenting to the ED for nausea, vomiting, and abdominal discomfort was admitted to the Internal Medicine Teaching Service for hypercalcemia and AKI.   Subjective:  Day #2. Patient appeared engaged, alert, and was observed moving about the bed without issue. Patient states she had a small BM this morning (2 small pellets). Endorses improvement of abdominal pain. She is still having some bone pain but it is also improved. Denies any confusion, N/V, or issues voiding. She has been staying well hydrated. Discussed some results and pending labs. Patient's questions were answered.   Objective:  Vital signs in last 24 hours: Vitals:   12/07/22 2000 12/08/22 0120 12/08/22 0501 12/08/22 0909  BP: (!) 155/58  (!) 167/66 (!) 156/65  Pulse: 69 72 72 69  Resp: 20 18 15 18   Temp: 97.9 F (36.6 C)  98.4 F (36.9 C) 98.7 F (37.1 C)  TempSrc: Oral  Oral Oral  SpO2: 97% 96% 100% 100%  Weight:      Height:          Latest Ref Rng & Units 12/08/2022    4:26 AM 12/07/2022    4:25 AM 12/06/2022    4:07 PM  CBC  WBC 4.0 - 10.5 K/uL 5.9  6.9  6.3   Hemoglobin 12.0 - 15.0 g/dL 8.4  9.6  34.7   Hematocrit 36.0 - 46.0 % 25.0  29.1  30.5   Platelets 150 - 400 K/uL 166  192  203        Latest Ref Rng & Units 12/08/2022    4:26 AM 12/07/2022    4:25 AM 12/06/2022    4:07 PM  CMP  Glucose 70 - 99 mg/dL 85  70  81   BUN 8 - 23 mg/dL 20  26  29    Creatinine 0.44 - 1.00 mg/dL 4.25  9.56  3.87   Sodium 135 - 145 mmol/L 139  139  138   Potassium 3.5 - 5.1 mmol/L 3.6  3.6  3.4   Chloride 98 - 111 mmol/L 107  105  97   CO2 22 - 32 mmol/L 25  26  31    Calcium 8.9 - 10.3 mg/dL 56.4  33.2  >95.1   Total Protein 6.5 - 8.1 g/dL   7.1   Total Bilirubin <1.2 mg/dL   0.8   Alkaline Phos 38 - 126 U/L   123   AST 15 - 41 U/L   22   ALT 0 - 44 U/L   10     Physical Exam Constitutional: Patient is resting  comfortably in bed in no acute distress. Not wrapped up in blankets compared to yesterday. She was still observed to have a slight generalized tremor.  CV: Regular rate and rhythm without murmurs on auscultation. Trace LE edema especially around the feet and ankles.  Pulmonary/Respiratory: Normal respiratory effort on room air.  Abdominal: Soft, non-distended. No LUQ or LLQ pain, some epigastric pain on deep palpation.  Skin: Warm and dry.  Neuro: Alert and oriented, no focal deficits noted. Speech normal, conversing appropriately.  Psych: Normal mood and affect.   Assessment/Plan:  Principal Problem:   Hypercalcemia Active Problems:   Diabetes mellitus type II, controlled (HCC)   Obstructive sleep apnea   GERD (gastroesophageal reflux disease)   CKD (chronic kidney disease) stage 3, GFR 30-59 ml/min (HCC)   Hyperlipidemia   AKI (acute kidney injury) (HCC)  Hypercalcemia Patient's Ca improved to 10.7 today, completed calcitonin doses. Calcitriol 66.0 WNL, PTH 6 (low), and PTH-r in process. Results indicate etiology less likely to be primary hyperthyroidism and more likely to be due to exogenous calcium intake. Will follow up on remaining labs to determine further workup, will continue to reduce Ca levels with loop diuretics and good PO hydration.  - IV lasix 20 mg x 1 dose, educated patient on needing to drink water throughout today and a goal amount  - Daily RFP - Follow up on PTH-r  Hypophosphatemia  Critical lab phosphorus <1.0 this morning. Repleted with IV phosphorus. Today's PM phosphorus improved to 2.3. Will replete again with IV phosphorus, f/u on morning lab.   Obstipation Chronic constipation worsened with hypercalcemia. CT showed lots of stool in the colon. Rectal exam normal, scant stool in rectal vault. Patient with small BM today, will increase BM regimen with Miralax BID, Senokot 1 tablet BID, and PM enema today.   AKI AKI with Cr 2.75 on admission on CKD 3. Suspect  this is due to calcium induced diuresis and dehydration. Cr improved to 2.02 today. Encouraged good hydration. Will continue to monitor.    Diabetes Well-controlled at present, last A1c was 5.3% 2 months ago. Glucose is normal this evening. Will defer starting insulin for this person.  Will probably need to stop the GLP-1 agonist at least until her obstipation resolves.   OSA Continue CPAP that she uses outpatient.    GERD Had upper endoscopy for persistent nausea and vomiting that was unremarkable for etiology of the symptoms.  Resumed her home pantoprazole 20 mg daily.    Diet: Normal VTE: Enoxaparin IVF: LR, 250 mL/hr x 10 hours Code: Full  Philomena Doheny, MD, PGY-1 12/08/2022, 3:09 PM Pager: [phone] After 5pm on weekdays and 1pm on weekends: On Call pager [phone]

## 2022-12-09 ENCOUNTER — Inpatient Hospital Stay (HOSPITAL_COMMUNITY): Payer: 59

## 2022-12-09 DIAGNOSIS — K5909 Other constipation: Secondary | ICD-10-CM | POA: Diagnosis not present

## 2022-12-09 DIAGNOSIS — N179 Acute kidney failure, unspecified: Secondary | ICD-10-CM | POA: Diagnosis not present

## 2022-12-09 DIAGNOSIS — E119 Type 2 diabetes mellitus without complications: Secondary | ICD-10-CM | POA: Diagnosis not present

## 2022-12-09 LAB — CBC
HCT: 26.9 % — ABNORMAL LOW (ref 36.0–46.0)
Hemoglobin: 9.3 g/dL — ABNORMAL LOW (ref 12.0–15.0)
MCH: 30.4 pg (ref 26.0–34.0)
MCHC: 34.6 g/dL (ref 30.0–36.0)
MCV: 87.9 fL (ref 80.0–100.0)
Platelets: 177 10*3/uL (ref 150–400)
RBC: 3.06 MIL/uL — ABNORMAL LOW (ref 3.87–5.11)
RDW: 15.2 % (ref 11.5–15.5)
WBC: 5.8 10*3/uL (ref 4.0–10.5)
nRBC: 0 % (ref 0.0–0.2)

## 2022-12-09 LAB — RENAL FUNCTION PANEL
Albumin: 3.6 g/dL (ref 3.5–5.0)
Anion gap: 11 (ref 5–15)
BUN: 14 mg/dL (ref 8–23)
CO2: 23 mmol/L (ref 22–32)
Calcium: 9.2 mg/dL (ref 8.9–10.3)
Chloride: 102 mmol/L (ref 98–111)
Creatinine, Ser: 1.99 mg/dL — ABNORMAL HIGH (ref 0.44–1.00)
GFR, Estimated: 27 mL/min — ABNORMAL LOW (ref >60)
Glucose, Bld: 107 mg/dL — ABNORMAL HIGH (ref 70–99)
Phosphorus: 2.6 mg/dL (ref 2.5–4.6)
Potassium: 3.5 mmol/L (ref 3.5–5.1)
Sodium: 136 mmol/L (ref 135–145)

## 2022-12-09 LAB — GLUCOSE, CAPILLARY: Glucose-Capillary: 106 mg/dL — ABNORMAL HIGH (ref 70–99)

## 2022-12-09 MED ORDER — ALUM & MAG HYDROXIDE-SIMETH 200-200-20 MG/5ML PO SUSP
15.0000 mL | Freq: Once | ORAL | Status: AC
  Administered 2022-12-09: 15 mL via ORAL
  Filled 2022-12-09: qty 30

## 2022-12-09 NOTE — Progress Notes (Addendum)
Summary: [name] is a 67 y.o. with a history of hypertension, GERD, T2DM, and hyperlipidemia presenting to the ED for nausea, vomiting, and abdominal discomfort was admitted to the Internal Medicine Teaching Service for severe symptomatic hypercalcemia and AKI.   Subjective:  Day #3. No overnight events reported. Patient sitting up on the side of the bed after taking time to walk around her room and unit. She states she is feeling better overall, both physically and mentally. Denies further BM from the small BM she had yesterday. Still having some low back pain. Endorsed some nausea and vomiting yesterday with what looked like a meal she had days ago. She states sometimes when she eats she has a feeling of something in her throat, also coughs after meals. Continues to stay well hydrated.   Objective:  Vital signs in last 24 hours: Vitals:   12/08/22 1613 12/08/22 2111 12/09/22 0545 12/09/22 0922  BP: (!) 158/66 131/75 139/67 115/61  Pulse: 67 72 65 70  Resp: 18   18  Temp: 98.5 F (36.9 C) 98 F (36.7 C) 98.2 F (36.8 C) 98.4 F (36.9 C)  TempSrc: Oral Oral    SpO2: 96% 96% 97% 99%  Weight:      Height:          Latest Ref Rng & Units 12/09/2022    5:58 AM 12/08/2022    4:26 AM 12/07/2022    4:25 AM  CBC  WBC 4.0 - 10.5 K/uL 5.8  5.9  6.9   Hemoglobin 12.0 - 15.0 g/dL 9.3  8.4  9.6   Hematocrit 36.0 - 46.0 % 26.9  25.0  29.1   Platelets 150 - 400 K/uL 177  166  192        Latest Ref Rng & Units 12/09/2022    5:58 AM 12/08/2022    4:26 AM 12/07/2022    4:25 AM  CMP  Glucose 70 - 99 mg/dL 409  85  70   BUN 8 - 23 mg/dL 14  20  26    Creatinine 0.44 - 1.00 mg/dL 8.11  9.14  7.82   Sodium 135 - 145 mmol/L 136  139  139   Potassium 3.5 - 5.1 mmol/L 3.5  3.6  3.6   Chloride 98 - 111 mmol/L 102  107  105   CO2 22 - 32 mmol/L 23  25  26    Calcium 8.9 - 10.3 mg/dL 9.2  95.6  21.3     Physical Exam Constitutional: Patient is sitting up in bed, alert, engaged, and  conversing appropriately.  CV: Regular rate and rhythm without murmurs on auscultation. No LE edema.  Pulmonary/Respiratory: Normal respiratory effort on room air.  Abdominal: Soft, non-distended. Epigastric pain on deep palpation.   MSK: Tender lower back at midline with palpation.  Skin: Warm and dry.  Neuro: Alert and oriented, no focal deficits noted. Speech normal, conversing appropriately.  Psych: Normal mood and affect.   Assessment/Plan:  Principal Problem:   Hypercalcemia Active Problems:   Diabetes mellitus type II, controlled (HCC)   Obstructive sleep apnea   GERD (gastroesophageal reflux disease)   CKD (chronic kidney disease) stage 3, GFR 30-59 ml/min (HCC)   Hyperlipidemia   AKI (acute kidney injury) (HCC)  Hypercalcemia Patient's Ca improved to 9.2 today following IV lasix 20 mg and PO hydration 11/21. This follows improvement in calcium from 13.4 on admission to 10.7 yesterday following treatment with calcitonin and IV fluids. Calcitriol 66.0 WNL, PTH 6 (low),  and PTH-r in process. Depressed PTH indicates etiology less likely to be primary hyperthyroidism, PTH-r still in process to help determine if malignancy is contributing to hypercalcemia outside of patient's exogenous calcium use and higher Vitamin D levels. Will follow up on remaining labs to determine further workup while patient is here. Will not repeat diuretics today to help determine if Ca level is stable on morning labs. Removed telemetry.  Plan:  - Daily RFP - Follow up on PTH-r  Obstipation Chronic constipation worsened with hypercalcemia. CT showed lots of stool in the colon. Endorsing some low back pain that may be related. Rectal exam normal, scant stool in rectal vault. Patient with small BM 11/21, no further BM today following increased enema.  - Continue with maximum BM regimen with Miralax BID, Senokot 1 tablet BID, and enema PRN.  GERD History of hiatal hernia. Had upper endoscopy on 10/09 with  Dr. Dulce Sellar at [facility] for persistent nausea and vomiting that was unremarkable for etiology of symptoms. Resumed her home pantoprazole 20 mg daily. Today patient reported 1 episode of vomiting of undigested food that happened yesterday evening. Has had some nausea as well. No further episodes of emesis today. Does endorse coughing with food and feeling like something is stuck from time to time. Will obtain imaging for now to help determine if there are structural concerns and further workup is needed inpatient vs outpatient.  Plan:  - Barium esophagram ordered, follow up as needed   AKI- improving AKI with Cr 2.75 on admission on CKD 3. Suspect this is due to calcium induced diuresis and dehydration. Cr improving, 1.99 today despite use of IV diuretic yesterday. Continued encouraging good hydration. Will continue to monitor on daily RFP.   Hypophosphatemia- resolved Critical lab phosphorus <1.0 this morning. Repleted with IV phosphorus. Today's PM phosphorus improved to 2.3. Will replete again with IV phosphorus, f/u on morning lab.    Diabetes- chronic  Well-controlled at present, last A1c was 5.3% 2 months ago. Glucose remains <180. Will probably need to stop the GLP-1 agonist outpatient until her obstipation resolves.   OSA Continue CPAP that she uses outpatient.    Diet: Normal VTE: Enoxaparin Code: Full  Philomena Doheny, MD, PGY-1 12/09/2022, 11:25 AM Pager: [phone] After 5pm on weekdays and 1pm on weekends: On Call pager [phone]

## 2022-12-09 NOTE — Plan of Care (Signed)
  Problem: Education: Goal: Knowledge of General Education information will improve Description: Including pain rating scale, medication(s)/side effects and non-pharmacologic comfort measures Outcome: Progressing   Problem: Health Behavior/Discharge Planning: Goal: Ability to manage health-related needs will improve Outcome: Progressing   Problem: Clinical Measurements: Goal: Ability to maintain clinical measurements within normal limits will improve Outcome: Progressing Goal: Will remain free from infection Outcome: Progressing Goal: Diagnostic test results will improve Outcome: Progressing Goal: Respiratory complications will improve Outcome: Progressing Goal: Cardiovascular complication will be avoided Outcome: Progressing   Problem: Activity: Goal: Risk for activity intolerance will decrease Outcome: Progressing   Problem: Coping: Goal: Level of anxiety will decrease Outcome: Progressing   Problem: Elimination: Goal: Will not experience complications related to bowel motility Outcome: Progressing Goal: Will not experience complications related to urinary retention Outcome: Progressing   Problem: Pain Management: Goal: General experience of comfort will improve Outcome: Progressing   Problem: Safety: Goal: Ability to remain free from injury will improve Outcome: Progressing   Problem: Skin Integrity: Goal: Risk for impaired skin integrity will decrease Outcome: Progressing

## 2022-12-09 NOTE — Care Management Important Message (Signed)
Important Message  Patient Details  Name: [name] MRN: [id number] Date of Birth: 1955/06/18   Important Message Given:  Yes - Medicare IM     Dorena Bodo 12/09/2022, 3:46 PM

## 2022-12-09 NOTE — Progress Notes (Signed)
Mobility Specialist: Progress Note   12/09/22 1134  Mobility  Activity Ambulated with assistance in hallway  Level of Assistance Contact guard assist, steadying assist  Assistive Device Front wheel walker  Distance Ambulated (ft) 350 ft  Activity Response Tolerated well  Mobility Referral Yes  $Mobility charge 1 Mobility  Mobility Specialist Start Time (ACUTE ONLY) [id number]  Mobility Specialist Stop Time (ACUTE ONLY) 1015  Mobility Specialist Time Calculation (min) (ACUTE ONLY) 51 min    Pt was agreeable to mobility session - received in bed. No complaints; feeling much better today. ModI for bed mobility, SV for STS, CG for ambulation for safety. 1x LOB during ambulation but able to catch herself with wall. Took 1x seated (~5 min) break d/t slight fatigue. Returned to room without fault. Left on EOB with all needs met, call bell in reach.   Maurene Capes Mobility Specialist Please contact via SecureChat or Rehab office at [phone]

## 2022-12-10 DIAGNOSIS — K5909 Other constipation: Secondary | ICD-10-CM | POA: Diagnosis present

## 2022-12-10 LAB — CBC
HCT: 26.5 % — ABNORMAL LOW (ref 36.0–46.0)
Hemoglobin: 9.1 g/dL — ABNORMAL LOW (ref 12.0–15.0)
MCH: 30.2 pg (ref 26.0–34.0)
MCHC: 34.3 g/dL (ref 30.0–36.0)
MCV: 88 fL (ref 80.0–100.0)
Platelets: 174 10*3/uL (ref 150–400)
RBC: 3.01 MIL/uL — ABNORMAL LOW (ref 3.87–5.11)
RDW: 15.2 % (ref 11.5–15.5)
WBC: 5.6 10*3/uL (ref 4.0–10.5)
nRBC: 0 % (ref 0.0–0.2)

## 2022-12-10 LAB — RENAL FUNCTION PANEL
Albumin: 3.3 g/dL — ABNORMAL LOW (ref 3.5–5.0)
Anion gap: 9 (ref 5–15)
BUN: 13 mg/dL (ref 8–23)
CO2: 24 mmol/L (ref 22–32)
Calcium: 9.4 mg/dL (ref 8.9–10.3)
Chloride: 106 mmol/L (ref 98–111)
Creatinine, Ser: 2.02 mg/dL — ABNORMAL HIGH (ref 0.44–1.00)
GFR, Estimated: 27 mL/min — ABNORMAL LOW (ref >60)
Glucose, Bld: 105 mg/dL — ABNORMAL HIGH (ref 70–99)
Phosphorus: 2.5 mg/dL (ref 2.5–4.6)
Potassium: 3.8 mmol/L (ref 3.5–5.1)
Sodium: 139 mmol/L (ref 135–145)

## 2022-12-10 LAB — GLUCOSE, CAPILLARY: Glucose-Capillary: 99 mg/dL (ref 70–99)

## 2022-12-10 NOTE — Plan of Care (Signed)
Problem: Education: Goal: Knowledge of General Education information will improve Description: Including pain rating scale, medication(s)/side effects and non-pharmacologic comfort measures Outcome: Progressing   Problem: Clinical Measurements: Goal: Respiratory complications will improve Outcome: Progressing   Problem: Activity: Goal: Risk for activity intolerance will decrease Outcome: Progressing

## 2022-12-10 NOTE — Progress Notes (Signed)
Summary: [name] is a 67 y.o. with a history of hypertension, GERD, T2DM, and hyperlipidemia presenting to the ED for nausea, vomiting, and abdominal discomfort was admitted to the Internal Medicine Teaching Service for severe symptomatic hypercalcemia and AKI.   Subjective:  Doing well this morning after having 2 bowel movements this morning.  Both were hard and painful but it is making her abdomen feel better and she is eating well without recurrent nausea or vomiting.  Discussed results of barium esophagram which showed poor peristalsis in her lower esophagus.  Recommended to eat slowly and sit upright while eating and after eating.  Discussed gastroparesis concerns especially while on GLP-1 however due to her esophageal findings this will need to be addressed before we can know if she is having significant gastroparesis.  Discussed with her that and is more likely the calcium caused her constipation in the esophagus issue caused her nausea and vomiting so we will know more after we addressed both of these.  Objective:  Vital signs in last 24 hours: Vitals:   12/09/22 1650 12/09/22 1958 12/10/22 0530 12/10/22 0839  BP: (!) 120/56 128/82 128/71 (!) 148/72  Pulse: 74 72 69 72  Resp: 17 17 17 18   Temp: 98.1 F (36.7 C) 98 F (36.7 C) 97.7 F (36.5 C) 97.6 F (36.4 C)  TempSrc: Oral Oral Oral Oral  SpO2: 99% 100% 100% 100%  Weight:      Height:          Latest Ref Rng & Units 12/10/2022    3:50 AM 12/09/2022    5:58 AM 12/08/2022    4:26 AM  CBC  WBC 4.0 - 10.5 K/uL 5.6  5.8  5.9   Hemoglobin 12.0 - 15.0 g/dL 9.1  9.3  8.4   Hematocrit 36.0 - 46.0 % 26.5  26.9  25.0   Platelets 150 - 400 K/uL 174  177  166        Latest Ref Rng & Units 12/10/2022    3:50 AM 12/09/2022    5:58 AM 12/08/2022    4:26 AM  CMP  Glucose 70 - 99 mg/dL 841  324  85   BUN 8 - 23 mg/dL 13  14  20    Creatinine 0.44 - 1.00 mg/dL 4.01  0.27  2.53   Sodium 135 - 145 mmol/L 139  136  139    Potassium 3.5 - 5.1 mmol/L 3.8  3.5  3.6   Chloride 98 - 111 mmol/L 106  102  107   CO2 22 - 32 mmol/L 24  23  25    Calcium 8.9 - 10.3 mg/dL 9.4  9.2  66.4     Physical Exam Constitutional: Patient is sitting up in bed, alert, engaged, and conversing appropriately.  CV: Regular rate and rhythm. No LE edema.  Pulmonary/Respiratory: Normal respiratory effort on room air.  Abdominal: Soft, non-distended, nontender.  Positive bowel sounds Skin: Warm and dry.  Neuro: Alert and oriented, no focal deficits noted. Speech normal, conversing appropriately.  Psych: Normal mood and affect.   Assessment/Plan:  Principal Problem:   Hypercalcemia Active Problems:   Diabetes mellitus type II, controlled (HCC)   Obstructive sleep apnea   GERD (gastroesophageal reflux disease)   CKD (chronic kidney disease) stage 3, GFR 30-59 ml/min (HCC)   Hyperlipidemia   AKI (acute kidney injury) (HCC)  Hypercalcemia Calcium continues to be stable off treatment for the past 24 hours.  Calcium is 9.4 today from 9.2 yesterday.  Still waiting on PTHrp but otherwise workup unrevealing so far and most consistent with exogenous calcium. Plan:  - Daily RFP - Follow up on PTH-rp  Obstipation 2 hard bowel movements overnight over painful.  She is interested in another enema today.  I think we are close to clearing her severely hardened stool. - Continue with maximum BM regimen with Miralax BID, Senokot 1 tablet BID, and enema PRN.  GERD Esophageal motility issue History of hiatal hernia. Had upper endoscopy on 10/09 with Dr. Dulce Sellar at [facility] for persistent nausea and vomiting that was unremarkable for etiology of symptoms. Resumed her home pantoprazole 20 mg daily.  After another episode of nausea and vomiting she had a barium esophagram which showed poor primary peristalsis in her lower esophagus and redemonstrated her hiatal hernia.  Nausea and vomiting could be due to her food being stuck in her lower esophagus  and not emptying to her stomach.  Counseled to eat slower and sit straight up while eating and for least 30 minutes after eating.  As above did have a recent endoscopy and no strictures or signs of Barrett's esophagus.  Barium esophagram not consistent with achalasia either.  She will need outpatient GI follow-up.   AKI- improving AKI with Cr 2.75 on admission on CKD 3.  Stable at 2.02 today.  Continues to do well with oral hydration and has good urine output.  Possibly this is her new baseline and a progression on her CKD.  We will continue to monitor.  Hypophosphatemia- resolved   Diabetes- chronic  Well-controlled at present, last A1c was 5.3% 2 months ago. Glucose remains <180.  May need to discuss discontinuing GLP-1 if symptoms recur or there are more signs of gastroparesis however at the current time her symptoms of constipation and nausea and vomiting can be attributed to her calcium use and lower esophagus peristalsis issues.   OSA Continue CPAP that she uses outpatient.    Diet: Normal VTE: Enoxaparin Code: Full  Rocky Morel, DO, PGY-2 12/10/2022, 9:24 AM Pager: [phone] After 5pm on weekdays and 1pm on weekends: On Call pager [phone]

## 2022-12-10 NOTE — Progress Notes (Signed)
Mobility Specialist Progress Note    12/10/22 1345  Mobility  Activity Ambulated with assistance in hallway  Level of Assistance Standby assist, set-up cues, supervision of patient - no hands on  Assistive Device Front wheel walker  Distance Ambulated (ft) 350 ft  Activity Response Tolerated well  Mobility Referral Yes  $Mobility charge 1 Mobility  Mobility Specialist Start Time (ACUTE ONLY) 1323  Mobility Specialist Stop Time (ACUTE ONLY) 1345  Mobility Specialist Time Calculation (min) (ACUTE ONLY) 22 min   Pt received sitting EOB and agreeable. No complaints on walk. Took x1 extended seated rest break. Returned to sitting EOB with call bell in reach.   [place] Mobility Specialist  Please Neurosurgeon or Rehab Office at [phone]

## 2022-12-10 NOTE — Progress Notes (Signed)
Patient tolerated tap water enema well. She had 1 bm post enema.

## 2022-12-11 ENCOUNTER — Inpatient Hospital Stay (HOSPITAL_COMMUNITY): Payer: 59

## 2022-12-11 DIAGNOSIS — I951 Orthostatic hypotension: Secondary | ICD-10-CM | POA: Diagnosis not present

## 2022-12-11 DIAGNOSIS — W19XXXA Unspecified fall, initial encounter: Secondary | ICD-10-CM | POA: Diagnosis not present

## 2022-12-11 DIAGNOSIS — K5909 Other constipation: Secondary | ICD-10-CM | POA: Diagnosis not present

## 2022-12-11 LAB — RENAL FUNCTION PANEL
Albumin: 3.4 g/dL — ABNORMAL LOW (ref 3.5–5.0)
Anion gap: 8 (ref 5–15)
BUN: 14 mg/dL (ref 8–23)
CO2: 21 mmol/L — ABNORMAL LOW (ref 22–32)
Calcium: 9.3 mg/dL (ref 8.9–10.3)
Chloride: 107 mmol/L (ref 98–111)
Creatinine, Ser: 1.77 mg/dL — ABNORMAL HIGH (ref 0.44–1.00)
GFR, Estimated: 31 mL/min — ABNORMAL LOW (ref >60)
Glucose, Bld: 126 mg/dL — ABNORMAL HIGH (ref 70–99)
Phosphorus: 3 mg/dL (ref 2.5–4.6)
Potassium: 3.3 mmol/L — ABNORMAL LOW (ref 3.5–5.1)
Sodium: 136 mmol/L (ref 135–145)

## 2022-12-11 MED ORDER — ENOXAPARIN SODIUM 40 MG/0.4ML IJ SOSY
40.0000 mg | PREFILLED_SYRINGE | INTRAMUSCULAR | Status: DC
Start: 2022-12-11 — End: 2022-12-12
  Administered 2022-12-11: 40 mg via SUBCUTANEOUS
  Filled 2022-12-11: qty 0.4

## 2022-12-11 MED ORDER — LIDOCAINE 5 % EX PTCH
1.0000 | MEDICATED_PATCH | CUTANEOUS | Status: DC
  Administered 2022-12-11 (×2): 1 via TRANSDERMAL
  Filled 2022-12-11 (×2): qty 1

## 2022-12-11 MED ORDER — BISMUTH SUBSALICYLATE 262 MG/15ML PO SUSP
30.0000 mL | ORAL | Status: DC | PRN
  Administered 2022-12-11 (×2): 30 mL via ORAL
  Filled 2022-12-11: qty 236

## 2022-12-11 MED ORDER — SENNOSIDES-DOCUSATE SODIUM 8.6-50 MG PO TABS: 1.0000 | ORAL_TABLET | Freq: Every day | ORAL | Status: DC

## 2022-12-11 MED ORDER — POTASSIUM CHLORIDE CRYS ER 20 MEQ PO TBCR
40.0000 meq | EXTENDED_RELEASE_TABLET | Freq: Once | ORAL | Status: AC
  Administered 2022-12-11: 40 meq via ORAL
  Filled 2022-12-11: qty 2

## 2022-12-11 NOTE — Plan of Care (Signed)

## 2022-12-11 NOTE — Progress Notes (Signed)
   12/10/22 2345  What Happened  Was fall witnessed? No  Was patient injured? No  Patient found other (Comment) (Patient in bed)  Found by No one-pt stated they fell  Stated prior activity bathroom-unassisted  Provider Notification  Provider Name/Title Dr. Versie Starks  Date Provider Notified 12/10/22  Time Provider Notified 2340  Method of Notification Page  Notification Reason Fall  Provider response En route;See new orders  Date of Provider Response 12/11/22  Time of Provider Response 0000  Follow Up  Family notified No - patient refusal  Additional tests Yes-comment  Simple treatment Other (comment)  Progress note created (see row info) Yes  Adult Fall Risk Assessment  Risk Factor Category (scoring not indicated) Fall has occurred during this admission (document High fall risk)  Age 67  Fall History: Fall within 6 months prior to admission 0  Elimination; Bowel and/or Urine Incontinence 0  Elimination; Bowel and/or Urine Urgency/Frequency 0  Medications: includes PCA/Opiates, Anti-convulsants, Anti-hypertensives, Diuretics, Hypnotics, Laxatives, Sedatives, and Psychotropics 3  Patient Care Equipment 0  Mobility-Assistance 2  Mobility-Gait 0  Mobility-Sensory Deficit 0  Altered awareness of immediate physical environment 0  Impulsiveness 0  Lack of understanding of one's physical/cognitive limitations 0  Total Score 6  Patient Fall Risk Level High fall risk  Adult Fall Risk Interventions  Required Bundle Interventions *See Row Information* High fall risk - low, moderate, and high requirements implemented  Additional Interventions Use of appropriate toileting equipment (bedpan, BSC, etc.)  Screening for Fall Injury Risk (To be completed on HIGH fall risk patients) - Assessing Need for Floor Mats  Risk For Fall Injury- Criteria for Floor Mats Previous fall this admission  Will Implement Floor Mats Yes  Vitals  Temp 98.1 F (36.7 C)  Temp Source Oral  BP (!) 153/70  MAP  (mmHg) 92  BP Location Right Arm  BP Method Automatic  Patient Position (if appropriate) Lying  Pulse Rate 66  Pulse Rate Source Monitor  Resp 18  Oxygen Therapy  SpO2 100 %  O2 Device Room Air  Pain Assessment  Pain Scale 0-10  Pain Score 3  Pain Type Acute pain  Pain Location Head  Pain Orientation Left  Pain Radiating Towards Neck  Pain Descriptors / Indicators Aching  Pain Frequency Intermittent  Pain Onset With Activity  Pain Intervention(s) MD notified (Comment)  PCA/Epidural/Spinal Assessment  Respiratory Pattern Regular;Unlabored  Neurological  Neuro (WDL) WDL  Level of Consciousness Alert  Orientation Level Oriented X4  Cognition Appropriate at baseline;Follows commands  Speech Clear  Glasgow Coma Scale  Eye Opening 4  Best Verbal Response (NON-intubated) 5  Best Motor Response 6  Glasgow Coma Scale Score 15  Musculoskeletal  Musculoskeletal (WDL) X  Assistive Device Front wheel walker  Generalized Weakness Yes  Weight Bearing Restrictions No  Integumentary  Integumentary (WDL) WDL  Skin Color Appropriate for ethnicity  Skin Condition Dry  Skin Integrity Other (Comment) (Scabs to BLE)  Skin Turgor Non-tenting  Pain Assessment  Date Pain First Started 12/10/22  Result of Injury Yes  Pain Assessment  Work-Related Injury No

## 2022-12-11 NOTE — Progress Notes (Addendum)
Paged by the nurse at 2345 that the patient had an unwitnessed fall and hit her head in the bathroom.  We went to evaluate the patient at bedside. She stated that she got up to go to the bathroom with her walker. When she set her walker aside to use the commode, she lost her balance and hit her back and head on the wall. She denies loss of consciousness. She denies any lightheadedness, dizziness, or shortness of breath preceding the event. She was able to pull herself up with the hand rail. Given her history, this sounds most likely a mechanical fall, but we will recommend the day team evaluate orthostatics.   On exam, there is no visible injury to her head or back. There is no tenderness to palpation over the areas of impact. She has no pain with flexion or extension of her neck. There is no midline tenderness over her cervical or thoracic spinous processes. She is alert and oriented with no focal neurological deficits. She is on Lovenox for DVT prophylaxis but no other blood thinners. She is not hypotensive or bradycardic.   We have ordered a CT head and CT C-spine to evaluate for any bony injuries, which she is at risk for given her age and hypercalcemia. We instructed her to contact her nurse prior to getting out of bed again as she is at risk for further falls.   Addendum: Imaging showed no evidence of bleeding or fractures in the head or c-spine.

## 2022-12-11 NOTE — Progress Notes (Addendum)
Summary: [name] is a 67 y.o. with a history of hypertension, GERD, T2DM, and hyperlipidemia presenting to the ED for nausea, vomiting, and abdominal discomfort was admitted to the Internal Medicine Teaching Service for severe symptomatic hypercalcemia and AKI.   Subjective:  Day #6. Patient fell late night 11/23 while getting up to use the restroom with her walker. Unwitnessed fall, patient states she hit her head when she landed on the floor. Overnight team assessed patient at bedside, no visible injury or focal deficit noted and patient remained HDS. Patient on Lovenox for DVT prophylaxis, no other blood thinners. CT head and CT C-spine ordered, no bleeding or fractures noted. Recommended daytime orthostatic vitals.  Patient states she feels well this morning. She is alert and looks forward to eating breakfast. Denied any headache, only having some soreness along left neck if she turns her body towards the right too quickly. Patient endorses some difficulty with stability when stands up too quickly or ambulates. Discussed sitting up and standing up slowly and calling for help prior to ambulating in her room or in the hallway for now. Endorses another BM yesterday evening that was softer and larger volume, unsure of color. No other patient complaints. Patient comfortable going home soon although she does not have a ride to take her home today should she remain stable for discharge.   Objective:  Vital signs in last 24 hours: Vitals:   12/10/22 0839 12/10/22 1702 12/10/22 2000 12/10/22 2345  BP: (!) 148/72 (!) 149/98 (!) 145/96 (!) 153/70  Pulse: 72 70 72 66  Resp: 18 18 18 18   Temp: 97.6 F (36.4 C) 98.3 F (36.8 C) 98.3 F (36.8 C) 98.1 F (36.7 C)  TempSrc: Oral Oral Oral Oral  SpO2: 100% 100% 100% 100%  Weight:      Height:          Latest Ref Rng & Units 12/10/2022    3:50 AM 12/09/2022    5:58 AM 12/08/2022    4:26 AM  CBC  WBC 4.0 - 10.5 K/uL 5.6  5.8  5.9    Hemoglobin 12.0 - 15.0 g/dL 9.1  9.3  8.4   Hematocrit 36.0 - 46.0 % 26.5  26.9  25.0   Platelets 150 - 400 K/uL 174  177  166        Latest Ref Rng & Units 12/11/2022    4:55 AM 12/10/2022    3:50 AM 12/09/2022    5:58 AM  CMP  Glucose 70 - 99 mg/dL 865  784  696   BUN 8 - 23 mg/dL 14  13  14    Creatinine 0.44 - 1.00 mg/dL 2.95  2.84  1.32   Sodium 135 - 145 mmol/L 136  139  136   Potassium 3.5 - 5.1 mmol/L 3.3  3.8  3.5   Chloride 98 - 111 mmol/L 107  106  102   CO2 22 - 32 mmol/L 21  24  23    Calcium 8.9 - 10.3 mg/dL 9.3  9.4  9.2     Physical Exam Constitutional: Patient is sitting up in bed, in no acute distress.  Pulmonary/Respiratory: Normal respiratory effort on room air.  Abdominal: Soft, non-distended, nontender.  Skin: Warm and dry.  Neuro: Alert and oriented, no focal deficits noted. Answering and asking questions appropriately.  Psych: Normal mood and affect.   Imaging CT head 11/24 IMPRESSION: 1. No acute intracranial abnormality. No calvarial fracture. 2. No acute fracture or listhesis of the  cervical spine. 3. 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  CT cervical spine 11/24 IMPRESSION: 1. No acute intracranial abnormality. No calvarial fracture. 2. No acute fracture or listhesis of the cervical spine. 3. 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  Assessment/Plan:  Principal Problem:   Hypercalcemia Active Problems:   Diabetes mellitus type II, controlled (HCC)   Obstructive sleep apnea   GERD (gastroesophageal reflux disease)   CKD (chronic kidney  disease) stage 3, GFR 30-59 ml/min (HCC)   Hyperlipidemia   AKI (acute kidney injury) (HCC)   Other constipation  Hypercalcemia- stable Calcium continues to be stable off treatment for the past 48 hours.  Calcium is 9.3 today from 9.4 yesterday. Still waiting on PTH-rp but otherwise workup unrevealing so far and most consistent with exogenous calcium. Plan:  - Daily RFP - Follow up on PTH-rp, consider if work-up needs to be inpatient vs outpatient  Obstipation- improving Two hard bowel movements overnight 11/22 to 11/23, painful. Evening BM 11/23 softer, larger. Enemas effective. Close to clearing her severely hardened stool. Given improvement in BM, will decrease current regimen.  - BM regimen with Miralax BID, Senokot 1 tablet nightly, and enema PRN.  Fall  Patient fell 11/23. CT head without fracture or intracranial abnormality, showed advanced chronic degenerative disc and degenerative joint disease. CT cervical spine without fracture and advanced degenerative disk and joint disease. Orthostatic vitals from this morning borderline positive for orthostatic hypotension (lying 138/66 to standing 116/71 to minutes), remained asymptomatic. Eating and drinking ok, no need for fluids at this time. Will monitor.  - Repeat orthostatics 11/25 AM  GERD Esophageal motility issue History of hiatal hernia. Recent endoscopy 10/09 unremarkable for etiology of symptoms. Resumed her home pantoprazole 20 mg daily on admission. Barium esophagram re-demonstrated her hiatal hernia and poor primary peristalsis in lower esophagus. Continue encouraging eating slower and sitting up straight while eating and for at least 30 minutes after finishing a meal. She will need outpatient GI follow-up.   AKI- improving AKI with Cr 2.75 on admission on CKD 3.  Stable at 2.02 today.  Continues to do well with oral hydration and has good urine output.  Possibly this is her new baseline and a progression on her CKD.  We will  continue to monitor.  Hypophosphatemia- resolved   Diabetes- chronic  Well-controlled at present, last A1c was 5.3% 2 months ago. Glucose remains <180.  May need to discuss discontinuing GLP-1 if symptoms recur or there are more signs of gastroparesis however at the current time her symptoms of constipation and nausea and vomiting can be attributed to her calcium use and lower esophagus peristalsis issues.   OSA- chronic Continue CPAP that she uses outpatient.    Diet: Normal VTE: Enoxaparin Code: Full  Philomena Doheny, MD, PGY-1 12/11/2022, 6:49 AM Pager: [phone] After 5pm on weekdays and 1pm on weekends: On Call pager [phone]

## 2022-12-11 NOTE — Progress Notes (Signed)
   12/11/22 2253  BiPAP/CPAP/SIPAP  BiPAP/CPAP/SIPAP Pt Type Adult  Reason BIPAP/CPAP not in use Non-compliant (pt stated she doesnt want to wear CPAP)  BiPAP/CPAP /SiPAP Vitals  Pulse Rate 82  Resp 16  SpO2 98 %

## 2022-12-11 NOTE — Progress Notes (Signed)
Mobility Specialist Progress Note    12/11/22 1519  Mobility  Activity Ambulated with assistance in hallway  Level of Assistance Contact guard assist, steadying assist  Assistive Device Front wheel walker  Distance Ambulated (ft) 350 ft (175+175)  Activity Response Tolerated well  Mobility Referral Yes  $Mobility charge 1 Mobility  Mobility Specialist Start Time (ACUTE ONLY) 1451  Mobility Specialist Stop Time (ACUTE ONLY) 1519  Mobility Specialist Time Calculation (min) (ACUTE ONLY) 28 min   Pt received sitting EOB and agreeable. Took x1 extended seated rest break c/o legs feeling shaky. Returned to bathroom. Pt agreed to pull string when ready to get up. RN aware.   [place] Mobility Specialist  Please Neurosurgeon or Rehab Office at [phone]

## 2022-12-12 ENCOUNTER — Other Ambulatory Visit: Payer: Self-pay | Admitting: Student

## 2022-12-12 ENCOUNTER — Other Ambulatory Visit (HOSPITAL_COMMUNITY): Payer: Self-pay

## 2022-12-12 DIAGNOSIS — E119 Type 2 diabetes mellitus without complications: Secondary | ICD-10-CM | POA: Diagnosis not present

## 2022-12-12 DIAGNOSIS — N179 Acute kidney failure, unspecified: Secondary | ICD-10-CM | POA: Diagnosis not present

## 2022-12-12 DIAGNOSIS — K5909 Other constipation: Secondary | ICD-10-CM | POA: Diagnosis not present

## 2022-12-12 DIAGNOSIS — I951 Orthostatic hypotension: Secondary | ICD-10-CM

## 2022-12-12 LAB — RENAL FUNCTION PANEL
Albumin: 3.2 g/dL — ABNORMAL LOW (ref 3.5–5.0)
Anion gap: 4 — ABNORMAL LOW (ref 5–15)
BUN: 10 mg/dL (ref 8–23)
CO2: 22 mmol/L (ref 22–32)
Calcium: 8.4 mg/dL — ABNORMAL LOW (ref 8.9–10.3)
Chloride: 113 mmol/L — ABNORMAL HIGH (ref 98–111)
Creatinine, Ser: 2.09 mg/dL — ABNORMAL HIGH (ref 0.44–1.00)
GFR, Estimated: 25 mL/min — ABNORMAL LOW (ref >60)
Glucose, Bld: 95 mg/dL (ref 70–99)
Phosphorus: 3.1 mg/dL (ref 2.5–4.6)
Potassium: 4 mmol/L (ref 3.5–5.1)
Sodium: 139 mmol/L (ref 135–145)

## 2022-12-12 LAB — GLUCOSE, CAPILLARY: Glucose-Capillary: 103 mg/dL — ABNORMAL HIGH (ref 70–99)

## 2022-12-12 MED ORDER — LIDOCAINE 5 % EX PTCH
1.0000 | MEDICATED_PATCH | CUTANEOUS | 0 refills | Status: DC
  Filled 2022-12-12: qty 30, 30d supply, fill #0

## 2022-12-12 MED ORDER — SENNOSIDES-DOCUSATE SODIUM 8.6-50 MG PO TABS
1.0000 | ORAL_TABLET | Freq: Every day | ORAL | 0 refills | Status: DC
  Filled 2022-12-12: qty 14, 14d supply, fill #0

## 2022-12-12 MED ORDER — POLYETHYLENE GLYCOL 3350 17 GM/SCOOP PO POWD
17.0000 g | Freq: Two times a day (BID) | ORAL | 0 refills | Status: DC
  Filled 2022-12-12: qty 238, 7d supply, fill #0

## 2022-12-12 MED ORDER — LACTATED RINGERS IV BOLUS
1000.0000 mL | Freq: Once | INTRAVENOUS | Status: AC
  Administered 2022-12-12: 1000 mL via INTRAVENOUS

## 2022-12-12 NOTE — TOC Transition Note (Signed)
Transition of Care [facility]) - CM/SW Discharge Note   Patient Details  Name: [name] MRN: [id number] Date of Birth: 1955-02-06  [facility] CM/SW Contact:  Tom-Johnson, Hershal Coria, RN Phone Number: 12/12/2022, 3:45 PM   Clinical Narrative:     Patient is scheduled for discharge today.  Readmission Risk Assessment done. Outpatient f/u, hospital f/u and discharge instructions on AVS. Prescriptions sent to [facility] pharmacy and meds will be delivered to patient at bedside prior discharge. No TOC needs or recommendations noted. Ferrel Logan to transport at discharge.  No further TOC needs noted.                  Final next level of care: Home/Self Care Barriers to Discharge: Barriers Resolved   Patient Goals and CMS Choice CMS Medicare.gov Compare Post Acute Care list provided to:: Patient Choice offered to / list presented to : NA  Discharge Placement                  Patient to be transferred to facility by: Friend Name of family member notified: Pricilla Riffle    Discharge Plan and Services Additional resources added to the After Visit Summary for                  DME Arranged: N/A DME Agency: NA       HH Arranged: NA HH Agency: NA        Social Determinants of Health (SDOH) Interventions SDOH Screenings   Food Insecurity: No Food Insecurity (12/07/2022)  Housing: Low Risk  (12/07/2022)  Transportation Needs: No Transportation Needs (12/07/2022)  Utilities: Not At Risk (12/07/2022)  Alcohol Screen: Low Risk  (10/26/2022)  Depression (PHQ2-9): Low Risk  (10/26/2022)  Recent Concern: Depression (PHQ2-9) - Medium Risk (09/15/2022)  Financial Resource Strain: Low Risk  (10/26/2022)  Physical Activity: Inactive (10/26/2022)  Social Connections: Moderately Integrated (10/26/2022)  Stress: No Stress Concern Present (10/26/2022)  Tobacco Use: Medium Risk (12/07/2022)  Health Literacy: Adequate Health Literacy (10/26/2022)      Readmission Risk Interventions    12/07/2022    4:26 PM  Readmission Risk Prevention Plan  Post Dischage Appt Complete  Medication Screening Complete  Transportation Screening Complete

## 2022-12-12 NOTE — Progress Notes (Signed)
DISCHARGE NOTE HOME ADDYSON READ to be discharged Home per MD order. Discussed prescriptions and follow up appointments with the patient. Prescriptions given to patient; medication list explained in detail. Patient verbalized understanding.  Skin clean, dry and intact without evidence of skin break down, no evidence of skin tears noted. IV catheter discontinued intact. Site without signs and symptoms of complications. Dressing and pressure applied. Pt denies pain at the site currently. No complaints noted.  Patient free of lines, drains, and wounds.   An After Visit Summary (AVS) was printed and given to the patient. Patient escorted via wheelchair, and discharged home via private auto.  Irwin Brakeman, RN

## 2022-12-12 NOTE — Discharge Instructions (Addendum)
You were seen for a high calcium level requiring immediate intervention, which we believe led to the symptoms of severe constipation. Your calcium levels were lowered with medication and have remained stable.   Imaging of your swallow function was evaluated with a barium esophagram that confirmed your hiatal hernia and your lower esophagus doesn't push food into your stomach as well as it used to. We recommend follow up with your gastroenterologist, Dr. Dulce Sellar, for follow up as you were observed having some difficulties swallowing larger pills. We recommend eating smaller bites, chewing food well before swallowing, and letting your doctor know at your follow up visit if you experience any further episodes of trouble with swallowing.    We gave you IV fluids on your last day here to help your kidneys and your symptoms of lightheadedness and dizziness you sometimes have with prolonged standing. Please come to the Internal Medicine Clinic at [facility] for a lab visit next Monday, December 2, so we can check your kidney function, your calcium, and your other electrolytes. Continue to stay well hydrated, and please call our clinic at [phone] if you feel like you are urinating less frequently, there are changes to your urine color, or you feel dehydrated despite your normal drinking habits.  Please STOP taking the following medications until you can discuss with your doctor at your follow-up visit:  Aspirin 81 mg daily (you may not need to take it anymore) Vitamin D supplement (your Vitamin D levels were normal) TUMS or other calcium containing antacids (as they can lead to elevated calcium levels) Multivitamin (it can be a source of additional calcium and vitamin D) Lisinopril/Zestril 20 mg for your blood pressure (your blood pressure has been ok, this medication can also injure your kidneys and we want them to show they are recovering before restarting)  Your hospitalization follow-up  appointment is scheduled for 12/13 at 9:45 AM with Dr. Manuela Neptune. Please bring your medications with you so she can help to review them. She will go over any other remaining labs. Please call [phone] if you need to make any changes to your appointment.   We are glad you are feeling better. It was a pleasure serving you during your stay. - Dr. Justin Mend and the Internal Medicine Teaching Service Team

## 2022-12-12 NOTE — Progress Notes (Signed)
Mobility Specialist: Progress Note   12/12/22 1256  Orthostatic Lying   BP- Lying 160/68  Orthostatic Sitting  BP- Sitting 156/74  Orthostatic Standing at 3 minutes  BP- Standing at 3 minutes 149/89  Mobility  Activity Ambulated with assistance in hallway  Level of Assistance Standby assist, set-up cues, supervision of patient - no hands on  Assistive Device Front wheel walker  Distance Ambulated (ft) 350 ft  Activity Response Tolerated well  Mobility Referral Yes  $Mobility charge 1 Mobility  Mobility Specialist Start Time (ACUTE ONLY) 1210  Mobility Specialist Stop Time (ACUTE ONLY) 1243  Mobility Specialist Time Calculation (min) (ACUTE ONLY) 33 min    Pt was agreeable to mobility session - received in bed. Orthostatics requested by RN. Asymptomatic throughout. ModI for bed, SV for STS and ambulation. Returned to room without fault. Left in bed with all needs met, call bell in reach. Chair alarm on.   Maurene Capes Mobility Specialist Please contact via SecureChat or Rehab office at [phone]

## 2022-12-12 NOTE — Progress Notes (Signed)
Mobility Specialist: Progress Note   12/12/22 1142  Mobility  Activity Ambulated with assistance in hallway  Level of Assistance Contact guard assist, steadying assist  Assistive Device Front wheel walker  Distance Ambulated (ft) 350 ft  Activity Response Tolerated well  Mobility Referral Yes  $Mobility charge 1 Mobility  Mobility Specialist Start Time (ACUTE ONLY) 0840  Mobility Specialist Stop Time (ACUTE ONLY) 0911  Mobility Specialist Time Calculation (min) (ACUTE ONLY) 31 min    Pt was agreeable to mobility session - received in bed. No complaints throughout. ModI for bed mobility, SV for STS, CG for ambulation. Took 1x seated break d/t fatigue. Returned to room without fault. Left on EOB with all needs met, call bell in reach. RN in room.   Maurene Capes Mobility Specialist Please contact via SecureChat or Rehab office at [phone]

## 2022-12-12 NOTE — Discharge Summary (Cosign Needed)
Name: [name] MRN: [id number] DOB: 1955/11/28 67 y.o. PCP: Modena Slater, DO  Date of Admission: 12/06/2022  3:43 PM Date of Discharge: 12/12/2022 2:57 PM Attending Physician: Dr. Mayford Knife  Discharge Diagnosis: Principal Problem:   Hypercalcemia Active Problems:   Diabetes mellitus type II, controlled (HCC)   Obstructive sleep apnea   GERD (gastroesophageal reflux disease)   CKD (chronic kidney disease) stage 3, GFR 30-59 ml/min (HCC)   Hyperlipidemia   AKI (acute kidney injury) (HCC)   Other constipation   Fall   Orthostatic hypotension    Discharge Medications: Allergies as of 12/12/2022       Reactions   Codeine Itching, Swelling   swelling in throat   Pravastatin Other (See Comments)   cramps   Latex Rash        Medication List     STOP taking these medications    aspirin EC 81 MG tablet   CALCIUM PO   lisinopril 20 MG tablet Commonly known as: ZESTRIL   multivitamin tablet   Vitamin D 50 MCG (2000 UT) tablet       TAKE these medications    Accu-Chek Aviva Plus w/Device Kit Use to monitor blood sugars   Accu-Chek Guide test strip Generic drug: glucose blood USE ONE STRIP TO TEST THREE TIMES A DAY   Accu-Chek Softclix Lancets lancets USE ONE LANCET TO TEST THREE TIMES A DAY   acetaminophen 500 MG tablet Commonly known as: TYLENOL Take 500-1,000 mg by mouth every 6 (six) hours as needed for moderate pain or headache.   Adult Blood Pressure Cuff Lg Kit Use to monitor your BP daily and bring log during each visit   atorvastatin 40 MG tablet Commonly known as: LIPITOR Take 1 tablet (40 mg total) by mouth daily.   Bag Balm Oint Apply to feet twice daily   Biotin 5000 MCG Caps Take 5,000 mcg by mouth daily.   cycloSPORINE 0.05 % ophthalmic emulsion Commonly known as: RESTASIS Place 1 drop into both eyes 2 (two) times daily. 8 am and 1700   ferrous sulfate 325 (65 FE) MG tablet Take 1 tablet every other day with  breakfast   lidocaine 5 % Commonly known as: LIDODERM Place 1 patch onto the skin daily. Remove & Discard patch within 12 hours or as directed by MD Start taking on: December 13, 2022   metFORMIN 500 MG 24 hr tablet Commonly known as: GLUCOPHAGE-XR Take 1 tablet (500 mg total) by mouth 2 (two) times daily with a meal.   Ozempic (0.25 or 0.5 MG/DOSE) 2 MG/3ML Sopn Generic drug: Semaglutide(0.25 or 0.5MG /DOS) DIAL AND INJECT UNDER THE SKIN 0.5 MG WEEKLY   pantoprazole 20 MG tablet Commonly known as: PROTONIX Take 1 tablet (20 mg total) by mouth 2 (two) times daily.   polyethylene glycol 17 g packet Commonly known as: MIRALAX / GLYCOLAX Take 17 g by mouth 2 (two) times daily.   REFRESH OP Place 1 drop into both eyes daily as needed (dry eyes).   Rocklatan 0.02-0.005 % Soln Generic drug: Netarsudil-Latanoprost Place 1 drop into both eyes daily at 10 pm.   senna-docusate 8.6-50 MG tablet Commonly known as: Senokot-S Take 1 tablet by mouth at bedtime.   Simbrinza 1-0.2 % Susp Generic drug: Brinzolamide-Brimonidine Place into both eyes in the morning and at bedtime.   vitamin C 1000 MG tablet Take 1,000 mg by mouth daily. Rose hips        Disposition and follow-up:   Sarah  [name] was discharged from [facility] in Stable condition.  At the hospital follow up visit please address:  1.  Follow-up:  Symptomatic hypercalcemia > 15 on admission. Improved with treatment, remained stable and WNL without further treatment prior to discharge. Likely due to exogenous Ca intake (Tums, vitamin D, multivitamin), however PTH-rp still pending. If PTH-rp returns elevated, will need further workup to rule out malignancy (additional imaging, MM workup, etc.).   Fall - Patient experienced 1 fall during hospitalization. Orthostatic vital signs positive multiple times while here. Still feeling slightly lightheaded on day of discharge, improves with rest. Educated  patient on safety, given 1L LR bolus in case volume related, recommended continued hydration. Held lisinopril on admission, continued holding due to hypotension and AKI. Patient's BP steadily going up by day of discharge. Please follow up on orthostatic vitals from 12/2, repeat as necessary 12/13, resume lisinopril if indicated.    GERD, emesis, nausea - History of hiatal hernia. Recent colonoscopy unremarkable. Patient with episodes of vomiting and regurgitation during hospitalization. Barium esophagram 11/22 with poor primary peristalsis, confirmed hiatal hernia. Symptoms could be due to food getting stuck. Patient asked to follow up outpatient with Dr. Dulce Sellar at [facility]. Check on symptoms and whether patient has scheduled an appointment with Dr. Dulce Sellar.   AKI on CKD 3 - Serum Cr 2.09 on day of discharge up from 1.77 day prior. Serum creatinine fluctuated throughout admission. Given 1L bolus LR prior to discharge with patient denying dysuria. Check RFP from lab visit 12/02 as well as repeat RFP if indicated to ensure Cr improving.   Obstipation - Resolved by day of discharge. Patient advised to stop all calcium containing medications and vitamin D supplementation. Given miralax and Senna PRN. Follow up if patient still improving and having regular BM.  Hypercalcemia Calcium on admission > 15 accompanied by symptoms of nausea, vomiting, abdominal discomfort, and severe constipation. CT abdomen pelvis did not show concern for blockage, did show mild prominence of stool in the colon although with some sparing of the sigmoid colon and rectum, appearance favoring constipation.    Underlying cause may be excessive use of calcium carbonate (Tums, 2-4 tablets daily) with concomitant vitamin D supplementation. Initial differential included primary hyperparathyroidism, malignancy, sarcoidosis, and familial/genetic causes of hypercalcemia. Family history of breast cancer and lung cancer, although patient's  CXR not concerning for lesions.    Patient's Ca improved to 13.4 following aggressive IV fluid hydration, 250 mL/h x 10 hours and calcitonin. Bisphosphonate therapy held because of AKI. Vitamin D 89.54 WNL, Calcitriol 66.0 WNL, PTH 6 (suppressed), phosphorus <1.0, Ionized calcium 8.2 (elevated). Patient's Ca improved to 9.2 following IV lasix 20 mg and PO hydration 11/21. Remained stable thereafter with calcium 8.4 and corrected calcium 9.0 on day of discharge. Low PTH made primary hyperthyroidism less likely. High calcium levels more likely related to patient's exogenous calcium use. Still waiting on PTH-rp lab result to help determine additional workup to rule out malignancy, as patient does have history of breast and lung cancer. Would indicate need to rule out multiple myeloma as well.   2.  Labs / imaging needed at time of follow-up: RFP, orthostatic vital signs  3.  Pending labs/ test needing follow-up: PTH-rp  4.  Medication Changes  STOPPED  - Lisinopril   - Aspirin   - Vitamin D  - TUMS  - Multivitamin    ADDED  - Miralax   - Senna  - Lidocaine patches   MODIFIED  -  n/a  Follow-up Appointments: - 12/02 Lab visit @ [facility] for RFP, orthostatic vital signs - 12/13 9:45 @ [facility] for hospitalization follow-up  - Patient asked to make follow-up appointment with her GI doctor, Dr. [facility] Course by problem list: [name] is a 67 y.o. with a history of hypertension, GERD, T2DM, and hyperlipidemia presenting to the ED for nausea, vomiting, and abdominal discomfort was admitted to the Internal Medicine Teaching Service on 12/06/2022 for hypercalcemia and AKI.   Hypercalcemia Calcium on admission > 15 accompanied by symptoms of nausea, vomiting, abdominal discomfort, and severe constipation. CT abdomen pelvis did not show concern for blockage, did show mild prominence of stool in the colon although with some sparing of the sigmoid colon and rectum, appearance favoring  constipation.    Underlying cause may be excessive use of calcium carbonate (Tums, 2-4 tablets daily) with concomitant vitamin D supplementation. Initial differential included primary hyperparathyroidism, malignancy, sarcoidosis, and familial/genetic causes of hypercalcemia. Family history of breast cancer and lung cancer, although patient's CXR not concerning for lesions.    Patient's Ca improved to 13.4 following aggressive IV fluid hydration, 250 mL/h x 10 hours and calcitonin. Bisphosphonate therapy held because of AKI. Vitamin D 89.54 WNL, Calcitriol 66.0 WNL, PTH 6 (suppressed), phosphorus <1.0, Ionized calcium 8.2 (elevated). Patient's Ca improved to 9.2 following IV lasix 20 mg and PO hydration 11/21. Remained stable thereafter with calcium 8.4 and corrected calcium 9.0 on day of discharge. Low PTH made primary hyperthyroidism less likely. High calcium levels more likely related to patient's exogenous calcium use. Still waiting on PTH-rp lab result to help determine additional workup to rule out malignancy, as patient does have history of breast and lung cancer. Would indicate need to rule out multiple myeloma as well.    Obstipation Chronic constipation with worsening likely due to hypercalcemia and GLP-1 use. CT shows lots of stool in the colon.  Rectal exam normal, scant stool in rectal vault. Patient started on Miralax and  soapsuds enema for relief of colonic stool burden. Maximized bowel regimen with Miralax BID, Senokot 1 tablet BID and enema PRN. Two hard bowel movements overnight 11/22 to 11/23, painful. BM 11/23. 11/24, and 11/25 softer and closer to patient's normal BM. Enemas effective. Discharged patient with PRN bowel regimen.   GERD Emesis  History of hiatal hernia. Had upper endoscopy on 10/09 with Dr. Dulce Sellar at [facility] for persistent nausea and vomiting that was unremarkable for etiology of symptoms. Resumed her home pantoprazole 20 mg daily. During admission patient endorsed  some nausea and 1 episode of vomiting with regurgitated food. Also endorsed intermittent coughing with food and feeling like something is stuck. Barium esophagram 11/22 with poor primary peristalsis in her lower esophagus and redemonstrated her hiatal hernia. Not consistent with achalasia, strictures, or signs of Barrett's esophagus. Nausea and vomiting could be due to her food being stuck in her lower esophagus and not emptying to her stomach. Counseled to eat slower and sit straight up while eating and for least 30 minutes after eating. She will need outpatient GI follow-up.   Fall  Patient fell 11/23. CT head without fracture or intracranial abnormality, showed advanced chronic degenerative disc and degenerative joint disease. CT cervical spine without fracture and advanced degenerative disk and joint disease. Orthostatic vitals positive for orthostatic hypotension (lying 138/66, HR 80; sitting 130/60, HR 82; standing 0 minutes 116/71, HR 97; standing 3 minutes 120/64, HR 100). Encouraged patient to stay well hydrated, given 1L LR on  day of discharge. Patient with some lightheadedness that improves with rest. Held antihypertensives, will need to check orthostatics outpatient before resuming.     AKI AKI with Cr 2.75 on admission on CKD 3. Suspect this is due to calcium induced diuresis and dehydration. Monitored on daily RFP. Serum Cr on day of discharge 2.09, up from 1.77 day prior. Patient given 1 L LR bolus. Asked patient to return for lab visit on 12/02 to check RFP. Held lisinopril due to AKI, continue holding until outpatient visit.   Hypokalemia- resolved K 3.3, repleted 11/24 with PO potassium. Potassium 4.0 on day of discharge.   Hypophosphatemia- resolved Critical lab phosphorus <1.0 11/21. Repleted with IV phosphorus. PM phosphorus improved to 2.3. Repleted again with IV phosphorus 11/22, improved to 2.6 and remains WNL at 3.1 on day of discharge.   Diabetes- chronic Well-controlled at  present, last A1c was 5.3% 2 months ago. Glucose is normal this evening. Did not start insulin. Obstipation resolved, did not appear to be due to slow gastric emptying. Patient can resume GLP-1 agonist and Metformin at discharge.    OSA- chronic  Chronic. Continued CPAP that she uses outpatient.    Discharge Subjective: No overnight events reported. Patient endorsed feeling better, had another soft bowel movement. She has been walking around with assistance, endorses some lightheadedness and dizziness with standing that improves with rest. Denied any acute pain, dysuria, or new concern. Asked many questions regarding indications to call clinic or return to the hospital. Reassured patient and requested that she come in for a lab visit 12/2 at [facility] to monitor Cr and Ca, patient agreeable. Patient ok going home today with close follow up at clinic on 12/13 for her hospitalization follow up visit.   Discharge Exam:   Blood pressure (!) 148/65, pulse 74, temperature 98.5 F (36.9 C), temperature source Oral, resp. rate 17, height 5\' 8"  (1.727 m), weight 94.8 kg, SpO2 99%.  Constitutional: well-appearing, sitting in chair next to bed, in no acute distress HENT: normocephalic atraumatic, mucous membranes moist Cardiovascular: regular rate and rhythm, no m/r/g, no LE edema Pulmonary/Chest: normal work of breathing on room air Abdominal: soft, non-distended, still slightly tender in epigastric area with moderate palpation, no suprapubic tenderness Neurological: alert & oriented, no focal deficits noted MSK: no gross abnormalities Skin: warm and dry Psych: Normal mood and affect  Pertinent Labs, Studies, and Procedures:     Latest Ref Rng & Units 12/10/2022    3:50 AM 12/09/2022    5:58 AM 12/08/2022    4:26 AM  CBC  WBC 4.0 - 10.5 K/uL 5.6  5.8  5.9   Hemoglobin 12.0 - 15.0 g/dL 9.1  9.3  8.4   Hematocrit 36.0 - 46.0 % 26.5  26.9  25.0   Platelets 150 - 400 K/uL 174  177  166        Latest  Ref Rng & Units 12/12/2022    4:05 AM 12/11/2022    4:55 AM 12/10/2022    3:50 AM  CMP  Glucose 70 - 99 mg/dL 95  161  096   BUN 8 - 23 mg/dL 10  14  13    Creatinine 0.44 - 1.00 mg/dL 0.45  4.09  8.11   Sodium 135 - 145 mmol/L 139  136  139   Potassium 3.5 - 5.1 mmol/L 4.0  3.3  3.8   Chloride 98 - 111 mmol/L 113  107  106   CO2 22 - 32 mmol/L 22  21  24   Calcium 8.9 - 10.3 mg/dL 8.4  9.3  9.4     DG Chest 2 View  Result Date: 12/07/2022 CLINICAL DATA:  Hypercalcemia. EXAM: CHEST - 2 VIEW COMPARISON:  December 04, 2009. FINDINGS: The heart size and mediastinal contours are within normal limits. Hypoinflation of the lungs with minimal bibasilar subsegmental atelectasis. The visualized skeletal structures are unremarkable. IMPRESSION: Hypoinflation of the lungs with minimal bibasilar subsegmental atelectasis. Electronically Signed   By: Lupita Raider M.D.   On: 12/07/2022 11:33   CT ABDOMEN PELVIS WO CONTRAST  Result Date: 12/06/2022 CLINICAL DATA:  Constipation, possible bowel obstruction EXAM: CT ABDOMEN AND PELVIS WITHOUT CONTRAST TECHNIQUE: Multidetector CT imaging of the abdomen and pelvis was performed following the standard protocol without IV contrast. RADIATION DOSE REDUCTION: This exam was performed according to the departmental dose-optimization program which includes automated exposure control, adjustment of the mA and/or kV according to patient size and/or use of iterative reconstruction technique. COMPARISON:  01/01/2010 FINDINGS: Lower chest: Calcifications along the left anterior descending coronary artery noted, indicating atheromatous vascular disease. Heart size within normal limits. Hepatobiliary: Small depending gallstones in the gallbladder. Otherwise unremarkable. Pancreas: Mildly atrophic pancreas. Punctate calcification pancreatic tail is stable and likely due to remote inflammation. Spleen: Unremarkable Adrenals/Urinary Tract: Unremarkable Stomach/Bowel: Mild  prominence of stool in the colon although with some sparing of the sigmoid colon and rectum, appearance favoring constipation. No obvious narrowing or constricting lesion the transition in caliber, although please note that noncontrast CT is not a sensitive way to assess for colon tumor. No dilated small bowel. Incidental small diverticulum of the terminal ileum. Prior appendectomy. Vascular/Lymphatic: Atherosclerosis is present, including aortoiliac atherosclerotic disease. Reproductive: Unremarkable Other: No supplemental non-categorized findings. Musculoskeletal: Substantial multilevel spondylosis and degenerative disc disease contributing to multilevel impingement. Mild degenerative hip arthropathy bilaterally. IMPRESSION: 1. Mild prominence of stool in the colon although with some sparing of the sigmoid colon and rectum, appearance favoring constipation. No obvious narrowing or constricting lesion the transition in caliber, although please note that noncontrast CT is not a sensitive way to assess for colon tumor. 2. Cholelithiasis. 3. Substantial multilevel spondylosis and degenerative disc disease contributing to multilevel impingement. 4. Mild degenerative hip arthropathy bilaterally. 5. Aortic atherosclerosis. Aortic Atherosclerosis (ICD10-I70.0). Electronically Signed   By: Gaylyn Rong M.D.   On: 12/06/2022 21:02     Discharge Instructions: You were seen for a high calcium level requiring immediate intervention, which we believe led to the symptoms of severe constipation. Your calcium levels were lowered with medication and have remained stable.   Imaging of your swallow function was evaluated with a barium esophagram that confirmed your hiatal hernia and your lower esophagus doesn't push food into your stomach as well as it used to. We recommend follow up with your gastroenterologist, Dr. Dulce Sellar, for follow up as you were observed having some difficulties swallowing larger pills. We recommend  eating smaller bites, chewing food well before swallowing, and letting your doctor know at your follow up visit if you experience any further episodes of trouble with swallowing.   We gave you IV fluids on your last day here to help your kidneys and your symptoms of lightheadedness and dizziness you sometimes have with prolonged standing. Please come to the Internal Medicine Clinic at [facility] for a lab visit next Monday, December 2, so we can check your kidney function, your calcium, and your other electrolytes. Continue to stay well hydrated, and please call our clinic at [phone]  [phone] if you feel like you are urinating less frequently, there are changes to your urine color, or you feel dehydrated despite your normal drinking habits.   Please STOP taking the following medications until you can discuss with your doctor at your follow-up visit:  Aspirin 81 mg daily (you may not need to take it anymore) Vitamin D supplement (your Vitamin D levels were normal) TUMS or other calcium containing antacids (as they can lead to elevated calcium levels) Multivitamin (it can be a source of additional calcium and vitamin D) Lisinopril/Zestril 20 mg for your blood pressure (your blood pressure has been ok, this medication can also injure your kidneys and we want them to show they are recovering before restarting)  Your hospitalization follow-up appointment is scheduled for 12/13 at 9:45 AM with Dr. Manuela Neptune. Please bring your medications with you so she can help to review them. She will go over any other remaining labs. Please call [phone] if you need to make any changes to your appointment.   We are glad you are feeling better. It was a pleasure serving you during your stay. - Dr. Justin Mend and the Internal Medicine Teaching Service Team  Signed: Ermelinda Eckert Colbert Coyer, MD [facility] - PGY1 Pager: [phone] 12/12/2022, 2:57 PM    Please contact the  on call pager after 5 pm and on weekends at [phone].

## 2022-12-12 NOTE — Plan of Care (Signed)
  Problem: Education: Goal: Knowledge of General Education information will improve Description: Including pain rating scale, medication(s)/side effects and non-pharmacologic comfort measures 12/12/2022 1553 by Irwin Brakeman, RN Outcome: Adequate for Discharge 12/12/2022 1358 by Irwin Brakeman, RN Outcome: Progressing   Problem: Health Behavior/Discharge Planning: Goal: Ability to manage health-related needs will improve 12/12/2022 1553 by Irwin Brakeman, RN Outcome: Adequate for Discharge 12/12/2022 1358 by Irwin Brakeman, RN Outcome: Progressing   Problem: Clinical Measurements: Goal: Ability to maintain clinical measurements within normal limits will improve 12/12/2022 1553 by Irwin Brakeman, RN Outcome: Adequate for Discharge 12/12/2022 1358 by Irwin Brakeman, RN Outcome: Progressing Goal: Will remain free from infection 12/12/2022 1553 by Irwin Brakeman, RN Outcome: Adequate for Discharge 12/12/2022 1358 by Irwin Brakeman, RN Outcome: Progressing Goal: Diagnostic test results will improve 12/12/2022 1553 by Irwin Brakeman, RN Outcome: Adequate for Discharge 12/12/2022 1358 by Irwin Brakeman, RN Outcome: Progressing Goal: Respiratory complications will improve 12/12/2022 1553 by Irwin Brakeman, RN Outcome: Adequate for Discharge 12/12/2022 1358 by Irwin Brakeman, RN Outcome: Progressing Goal: Cardiovascular complication will be avoided 12/12/2022 1553 by Irwin Brakeman, RN Outcome: Adequate for Discharge 12/12/2022 1358 by Irwin Brakeman, RN Outcome: Progressing   Problem: Activity: Goal: Risk for activity intolerance will decrease 12/12/2022 1553 by Irwin Brakeman, RN Outcome: Adequate for Discharge 12/12/2022 1358 by Irwin Brakeman, RN Outcome: Progressing   Problem: Nutrition: Goal: Adequate nutrition will be maintained 12/12/2022  1553 by Irwin Brakeman, RN Outcome: Adequate for Discharge 12/12/2022 1358 by Irwin Brakeman, RN Outcome: Progressing   Problem: Coping: Goal: Level of anxiety will decrease 12/12/2022 1553 by Irwin Brakeman, RN Outcome: Adequate for Discharge 12/12/2022 1358 by Irwin Brakeman, RN Outcome: Progressing   Problem: Elimination: Goal: Will not experience complications related to bowel motility 12/12/2022 1553 by Irwin Brakeman, RN Outcome: Adequate for Discharge 12/12/2022 1358 by Irwin Brakeman, RN Outcome: Progressing Goal: Will not experience complications related to urinary retention 12/12/2022 1553 by Irwin Brakeman, RN Outcome: Adequate for Discharge 12/12/2022 1358 by Irwin Brakeman, RN Outcome: Progressing   Problem: Pain Management: Goal: General experience of comfort will improve 12/12/2022 1553 by Irwin Brakeman, RN Outcome: Adequate for Discharge 12/12/2022 1358 by Irwin Brakeman, RN Outcome: Progressing   Problem: Safety: Goal: Ability to remain free from injury will improve 12/12/2022 1553 by Irwin Brakeman, RN Outcome: Adequate for Discharge 12/12/2022 1358 by Irwin Brakeman, RN Outcome: Progressing   Problem: Skin Integrity: Goal: Risk for impaired skin integrity will decrease 12/12/2022 1553 by Irwin Brakeman, RN Outcome: Adequate for Discharge 12/12/2022 1358 by Irwin Brakeman, RN Outcome: Progressing

## 2022-12-12 NOTE — Plan of Care (Signed)
Problem: Education: Goal: Knowledge of General Education information will improve Description: Including pain rating scale, medication(s)/side effects and non-pharmacologic comfort measures Outcome: Progressing   Problem: Activity: Goal: Risk for activity intolerance will decrease Outcome: Progressing   Problem: Elimination: Goal: Will not experience complications related to bowel motility Outcome: Progressing

## 2022-12-13 ENCOUNTER — Telehealth: Payer: Self-pay

## 2022-12-13 DIAGNOSIS — Z780 Asymptomatic menopausal state: Secondary | ICD-10-CM | POA: Insufficient documentation

## 2022-12-13 DIAGNOSIS — K224 Dyskinesia of esophagus: Secondary | ICD-10-CM | POA: Diagnosis present

## 2022-12-13 NOTE — Transitions of Care (Post Inpatient/ED Visit) (Signed)
   12/13/2022  Name: [name] MRN: [id number] DOB: 1955/04/02  Today's TOC FU Call Status: Today's TOC FU Call Status:: Unsuccessful Call (1st Attempt) Unsuccessful Call (1st Attempt) Date: 12/13/22  Attempted to reach the patient regarding the most recent Inpatient/ED visit.  Follow Up Plan: Additional outreach attempts will be made to reach the patient to complete the Transitions of Care (Post Inpatient/ED visit) call.   Jodelle Gross RN, BSN, CCM RN Care Manager  Transitions of Care  [facility]  [phone]

## 2022-12-14 ENCOUNTER — Telehealth: Payer: Self-pay

## 2022-12-14 NOTE — Transitions of Care (Post Inpatient/ED Visit) (Signed)
   12/14/2022  Name: CHELAN SHEPHERD MRN: [id number] DOB: [birth year]  Today's TOC FU Call Status: Today's TOC FU Call Status:: Unsuccessful Call (2nd Attempt) Unsuccessful Call (2nd Attempt) Date: 12/14/22  Attempted to reach the patient regarding the most recent Inpatient/ED visit.  Follow Up Plan: Additional outreach attempts will be made to reach the patient to complete the Transitions of Care (Post Inpatient/ED visit) call.   Jodelle Gross RN, BSN, CCM RN Care Manager  Transitions of Care  [facility] - [facility]  [phone]

## 2022-12-16 ENCOUNTER — Telehealth: Payer: Self-pay

## 2022-12-16 NOTE — Transitions of Care (Post Inpatient/ED Visit) (Signed)
   12/16/2022  Name: [name] MRN: [id number] DOB: 05-19-1955  Today's TOC FU Call Status: Today's TOC FU Call Status:: Unsuccessful Call (3rd Attempt) Unsuccessful Call (3rd Attempt) Date: 12/16/22  Attempted to reach the patient regarding the most recent Inpatient/ED visit.  Follow Up Plan: No further outreach attempts will be made at this time. We have been unable to contact the patient.  Desi Carby Daphine Deutscher BSN, RN RN Care Manager   Transitions of Care [facility] Direct Dial Number:  [phone]

## 2022-12-19 ENCOUNTER — Telehealth: Payer: Self-pay | Admitting: *Deleted

## 2022-12-19 ENCOUNTER — Other Ambulatory Visit (INDEPENDENT_AMBULATORY_CARE_PROVIDER_SITE_OTHER): Payer: 59

## 2022-12-19 DIAGNOSIS — N179 Acute kidney failure, unspecified: Secondary | ICD-10-CM

## 2022-12-19 DIAGNOSIS — K219 Gastro-esophageal reflux disease without esophagitis: Secondary | ICD-10-CM

## 2022-12-19 LAB — PTH-RELATED PEPTIDE: PTH-related peptide: <2 pmol/L

## 2022-12-19 MED ORDER — FAMOTIDINE 20 MG PO TABS: 20.0000 mg | ORAL_TABLET | Freq: Every day | ORAL | 0 refills | Status: DC

## 2022-12-19 NOTE — Telephone Encounter (Signed)
Patient here for labs today.  C/O heartburn was on Pantozole for 2 times a day.  Had some vomiting as well.  Everything she ate except some Chicken Noodle Soup caused her to have the heartburn.  Wants to know what she can do about this.  Also has taken some Miralax as well. Spoke with Dr. Ninfa Meeker . Dr. Ninfa Meeker spoke with patient.

## 2022-12-20 LAB — RENAL FUNCTION PANEL
Albumin: 3.9 g/dL (ref 3.9–4.9)
BUN/Creatinine Ratio: 7 — ABNORMAL LOW (ref 12–28)
BUN: 12 mg/dL (ref 8–27)
CO2: 18 mmol/L — ABNORMAL LOW (ref 20–29)
Calcium: 8.5 mg/dL — ABNORMAL LOW (ref 8.7–10.3)
Chloride: 104 mmol/L (ref 96–106)
Creatinine, Ser: 1.8 mg/dL — ABNORMAL HIGH (ref 0.57–1.00)
Glucose: 109 mg/dL — ABNORMAL HIGH (ref 70–99)
Phosphorus: 3.5 mg/dL (ref 3.0–4.3)
Potassium: 3.5 mmol/L (ref 3.5–5.2)
Sodium: 138 mmol/L (ref 134–144)
eGFR: 30 mL/min/{1.73_m2} — ABNORMAL LOW (ref >59)

## 2022-12-30 ENCOUNTER — Other Ambulatory Visit: Payer: Self-pay | Admitting: Student

## 2022-12-30 ENCOUNTER — Ambulatory Visit (INDEPENDENT_AMBULATORY_CARE_PROVIDER_SITE_OTHER): Payer: 59 | Admitting: Student

## 2022-12-30 VITALS — BP 140/81 | HR 73 | Temp 97.9°F | Ht 68.0 in | Wt 209.8 lb

## 2022-12-30 DIAGNOSIS — N179 Acute kidney failure, unspecified: Secondary | ICD-10-CM

## 2022-12-30 DIAGNOSIS — E1122 Type 2 diabetes mellitus with diabetic chronic kidney disease: Secondary | ICD-10-CM

## 2022-12-30 DIAGNOSIS — K224 Dyskinesia of esophagus: Secondary | ICD-10-CM | POA: Diagnosis not present

## 2022-12-30 DIAGNOSIS — Z7984 Long term (current) use of oral hypoglycemic drugs: Secondary | ICD-10-CM

## 2022-12-30 DIAGNOSIS — E118 Type 2 diabetes mellitus with unspecified complications: Secondary | ICD-10-CM | POA: Diagnosis not present

## 2022-12-30 DIAGNOSIS — K219 Gastro-esophageal reflux disease without esophagitis: Secondary | ICD-10-CM

## 2022-12-30 DIAGNOSIS — Z7985 Long-term (current) use of injectable non-insulin antidiabetic drugs: Secondary | ICD-10-CM

## 2022-12-30 DIAGNOSIS — N183 Chronic kidney disease, stage 3 unspecified: Secondary | ICD-10-CM | POA: Diagnosis not present

## 2022-12-30 DIAGNOSIS — I129 Hypertensive chronic kidney disease with stage 1 through stage 4 chronic kidney disease, or unspecified chronic kidney disease: Secondary | ICD-10-CM

## 2022-12-30 LAB — POCT GLYCOSYLATED HEMOGLOBIN (HGB A1C): Hemoglobin A1C: 5.3 % (ref 4.0–5.6)

## 2022-12-30 LAB — GLUCOSE, CAPILLARY: Glucose-Capillary: 112 mg/dL — ABNORMAL HIGH (ref 70–99)

## 2022-12-30 MED ORDER — METFORMIN HCL ER 500 MG PO TB24: 500.0000 mg | ORAL_TABLET | Freq: Every day | ORAL | 11 refills | Status: DC

## 2022-12-30 NOTE — Assessment & Plan Note (Signed)
Will need to FU with Dr. Dulce Sellar. Hypercalcemia 2/2 TUMS she was taking, still endorsing sxs of restrosternal burning pain and dysphagia. Giving her 14 more days of Famotidine today.   -Pt to schedule an appt with [facility].

## 2022-12-30 NOTE — Assessment & Plan Note (Signed)
Cr is usually around 1.3, 1.8 at discharge. She is drinking water and urinating with no issues. Will follow up today since she is on antihypertensives and needs to restart these.   -BMP

## 2022-12-30 NOTE — Assessment & Plan Note (Signed)
BP is elevated today at 140/81. Lisinopril 20mg  daily was stopped during her hospitalization 2/2 AKI.   -Repeat BMP today

## 2022-12-30 NOTE — Assessment & Plan Note (Signed)
Coming in for follow up after her hospitalization on 12/08/2022 for sever symptomatic hypercalcemia. Was having malaise, constipation, N/V. Her Ca was >15 during admission thought to be secondary to medication overuse as she was taking TUMS 2-4g daily and concomitant Vitamin D supplementation. PTH was suppressed, PTHrp was WNL, phosphorus was suppressed. CXR not concerning for sarcoidosis. There was no concerns for blockage on CT. Her calcium improved after aggressive IV fluid hydration and calcitonin.  Since, she has had no malaise, constipation improving at 3 BM weekly, no urinary sxs, abdominal pain and no N/V. No falls since her hospitalization. Orthostatics are negative today. She does endorse active GERD sxs and poor primary peristalsis on Barium Esophagram on 11/22. Taking pantoprazole but ran out of Famotidine. Has not followed with Dr. Dulce Sellar at [facility].   -BMP today  -FU with Dr. Dulce Sellar at [facility]

## 2022-12-30 NOTE — Patient Instructions (Addendum)
Thank you, [name] for allowing [place] to provide your care today.   I have ordered the following labs for you:  Lab Orders         BMP8+Anion Gap         POC Hbg A1C        Follow up:   Please schedule an appointment with [facility].   Continue taking metformin but can decrease dose to 500mg  ONCE daily.   Keep avoiding TUMS and vitamin D.   Should you have any questions or concerns please call the internal medicine clinic at [phone].     Manuela Neptune, MD [facility]

## 2022-12-30 NOTE — Assessment & Plan Note (Signed)
Has T2DM, endorses polyuria and polydipsia. Has lost over 70lbs intentionally. On Ozempic 0.5mg  weekly and metformin 500mg  BID.  Hgb A1C currently at 5.3 which is stable from February. With recent weight loss I anticipate her not needing as much metformin.   -Decrease metformin 500mg  once daily  -cw Ozempic 0.5mg  weekly

## 2022-12-30 NOTE — Progress Notes (Signed)
CC:  Chief Complaint  Patient presents with   Follow-up   HPI:  [name] is a 67 y.o. female living with a history stated below and presents today for follow up after her hospitalization. Please see problem based assessment and plan for additional details.  Past Medical History:  Diagnosis Date   Arthritis    Chronic normocytic anemia    CKD (chronic kidney disease) stage 3, GFR 30-59 ml/min (HCC) 02/02/2013   Diabetes mellitus type II, controlled (HCC) 06/17/2009   Diabetic ulcer of both feet (HCC) 01/07/2010   Qualifier: Diagnosis of  By: [name] MD, Amanjot     Essential hypertension 06/17/2009   GERD (gastroesophageal reflux disease)    Glaucoma    H/O hiatal hernia    Hardware complicating wound infection (HCC) 12/28/2020   Hyperlipidemia    MRSA (methicillin resistant Staphylococcus aureus)    Obstructive sleep apnea 05/26/2010   Polymicrobial bacterial infection 12/28/2020    Current Outpatient Medications on File Prior to Visit  Medication Sig Dispense Refill   Accu-Chek Softclix Lancets lancets USE ONE LANCET TO TEST THREE TIMES A DAY 100 each 12   acetaminophen (TYLENOL) 500 MG tablet Take 500-1,000 mg by mouth every 6 (six) hours as needed for moderate pain or headache.     Ascorbic Acid (VITAMIN C) 1000 MG tablet Take 1,000 mg by mouth daily. Rose hips     atorvastatin (LIPITOR) 40 MG tablet Take 1 tablet (40 mg total) by mouth daily. 90 tablet 3   Biotin 5000 MCG CAPS Take 5,000 mcg by mouth daily.     Blood Glucose Monitoring Suppl (ACCU-CHEK AVIVA PLUS) w/Device KIT Use to monitor blood sugars 1 kit 0   Blood Pressure Monitoring (ADULT BLOOD PRESSURE CUFF LG) KIT Use to monitor your BP daily and bring log during each visit 1 kit 0   cycloSPORINE (RESTASIS) 0.05 % ophthalmic emulsion Place 1 drop into both eyes 2 (two) times daily. 8 am and 1700     Emollient (BAG BALM) OINT Apply to feet twice daily 240 g 5   ferrous sulfate 325 (65 FE) MG tablet  Take 1 tablet every other day with breakfast 90 tablet 1   glucose blood (ACCU-CHEK GUIDE) test strip USE ONE STRIP TO TEST THREE TIMES A DAY 100 strip 2   lidocaine (LIDODERM) 5 % Place 1 patch onto the skin daily. Remove & Discard patch within 12 hours or as directed by MD 30 patch 0   Netarsudil-Latanoprost (ROCKLATAN) 0.02-0.005 % SOLN Place 1 drop into both eyes daily at 10 pm.     OZEMPIC, 0.25 OR 0.5 MG/DOSE, 2 MG/3ML SOPN DIAL AND INJECT UNDER THE SKIN 0.5 MG WEEKLY 3 mL 1   pantoprazole (PROTONIX) 20 MG tablet Take 1 tablet (20 mg total) by mouth 2 (two) times daily. 180 tablet 2   polyethylene glycol powder (GLYCOLAX/MIRALAX) 17 GM/SCOOP powder Mix as directed and take 17 g by mouth 2 (two) times daily. 238 g 0   Polyvinyl Alcohol-Povidone (REFRESH OP) Place 1 drop into both eyes daily as needed (dry eyes).     senna-docusate (SENOKOT-S) 8.6-50 MG tablet Take 1 tablet by mouth at bedtime. 14 tablet 0   SIMBRINZA 1-0.2 % SUSP Place into both eyes in the morning and at bedtime.     [DISCONTINUED] pravastatin (PRAVACHOL) 20 MG tablet Take 1 tablet (20 mg total) by mouth daily. 30 tablet 6   No current facility-administered medications on file prior to visit.  Family History  Problem Relation Age of Onset   Diabetes Mother    Hypertension Mother    Dementia Mother    Heart disease Father    Stroke Father    Diabetes Father    Hypertension Father    Gout Father    Deep vein thrombosis Father    Breast cancer Cousin    Breast cancer Cousin     Social History   Socioeconomic History   Marital status: Single    Spouse name: Not on file   Number of children: Not on file   Years of education: Not on file   Highest education level: Not on file  Occupational History   Occupation: Disabled  Tobacco Use   Smoking status: Former    Current packs/day: 0.00    Types: Cigarettes    Start date: 12/22/1972    Quit date: 12/22/1972    Years since quitting: 50.0   Smokeless  tobacco: Never  Vaping Use   Vaping status: Never Used  Substance and Sexual Activity   Alcohol use: No   Drug use: No   Sexual activity: Not Currently  Other Topics Concern   Not on file  Social History Narrative   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   Smoked when she was a teenager   no alcohol   no Drugs      Current Social History 10/26/2018        Patient lives on the main level of a 3 floor boarding house with 3 other tenants. There are 3 steps with handrails up to the entrance the patient uses.       Patient's method of transportation is personal car.      The highest level of education was Bachelor's Degree.      The patient currently disabled.      Identified important Relationships are "My cousins, [name] and Lianne Moris, and my dear friend Mliss Sax."      Pets : None       Interests / Fun: "I love to read, write, photography: go around [place] taking photos of the [address], Pop[employer]Art, Art Museums, cooking, hanging out with friends, several ministries with my Church, especially working with the youth."      Current Stressors: "Worrying about someone to care for me if anything happens to me. 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[place], BSN, RN-BC          Social Drivers of Health   Financial Resource Strain: Low Risk  (10/26/2022)   Overall Financial Resource Strain (CARDIA)    Difficulty of Paying Living Expenses: Not very hard  Food Insecurity: No Food Insecurity (12/07/2022)   Hunger Vital Sign    Worried About Running Out of Food in the Last Year: Never true    Ran Out of Food in the Last Year: Never true  Transportation Needs: No Transportation Needs (12/07/2022)   PRAPARE - Administrator, Civil Service (Medical): No    Lack of Transportation (Non-Medical): No  Physical Activity: Inactive (10/26/2022)    Exercise Vital Sign    Days of Exercise per Week: 0 days    Minutes of Exercise per Session: 0 min  Stress: No Stress Concern Present (10/26/2022)   [employer] of Occupational Health - Occupational Stress Questionnaire  Feeling of Stress : Only a little  Social Connections: Moderately Integrated (10/26/2022)   Social Connection and Isolation Panel [NHANES]    Frequency of Communication with Friends and Family: More than three times a week    Frequency of Social Gatherings with Friends and Family: Three times a week    Attends Religious Services: More than 4 times per year    Active Member of Clubs or Organizations: Yes    Attends Banker Meetings: More than 4 times per year    Marital Status: Never married  Intimate Partner Violence: Not At Risk (12/07/2022)   Humiliation, Afraid, Rape, and Kick questionnaire    Fear of Current or Ex-Partner: No    Emotionally Abused: No    Physically Abused: No    Sexually Abused: No    Review of Systems: ROS negative except for what is noted on the assessment and plan.  Vitals:   12/30/22 1001 12/30/22 1017  BP: (!) 152/79 (!) 140/81  Pulse: 76 73  Temp: 97.9 F (36.6 C)   TempSrc: Oral   SpO2: 100%   Weight: 209 lb 12.8 oz (95.2 kg)   Height: 5\' 8"  (1.727 m)     Physical Exam: Constitutional: well-appearing in no acute distress HENT: normocephalic atraumatic, mucous membranes moist Eyes: conjunctiva non-erythematous Cardiovascular: regular rate and rhythm, no m/r/g Pulmonary/Chest: normal work of breathing on room air, lungs clear to auscultation bilaterally Abdominal: soft, non-tender, non-distended MSK: normal bulk and tone Neurological: alert & oriented x 3, no focal deficit Skin: warm and dry Psych: normal mood and behavior  Assessment & Plan:     Patient seenwith Dr. Mayford Knife  Diabetes mellitus type 2, controlled, with complications (HCC) Has T2DM, endorses polyuria and polydipsia. Has lost over  70lbs intentionally. On Ozempic 0.5mg  weekly and metformin 500mg  BID.  Hgb A1C currently at 5.3 which is stable from February. With recent weight loss I anticipate her not needing as much metformin.   -Decrease metformin 500mg  once daily  -cw Ozempic 0.5mg  weekly    Hypertension associated with stage 3 chronic kidney disease due to type 2 diabetes mellitus (HCC) BP is elevated today at 140/81. Lisinopril 20mg  daily was stopped during her hospitalization 2/2 AKI.   -Repeat BMP today   AKI (acute kidney injury) (HCC) Cr is usually around 1.3, 1.8 at discharge. She is drinking water and urinating with no issues. Will follow up today since she is on antihypertensives and needs to restart these.   -BMP    Esophageal dysmotility Will need to FU with Dr. Dulce Sellar. Hypercalcemia 2/2 TUMS she was taking, still endorsing sxs of restrosternal burning pain and dysphagia. Giving her 14 more days of Famotidine today.   -Pt to schedule an appt with [facility].    Hypercalcemia Coming in for follow up after her hospitalization on 12/08/2022 for sever symptomatic hypercalcemia. Was having malaise, constipation, N/V. Her Ca was >15 during admission thought to be secondary to medication overuse as she was taking TUMS 2-4g daily and concomitant Vitamin D supplementation. PTH was suppressed, PTHrp was WNL, phosphorus was suppressed. CXR not concerning for sarcoidosis. There was no concerns for blockage on CT. Her calcium improved after aggressive IV fluid hydration and calcitonin.  Since, she has had no malaise, constipation improving at 3 BM weekly, no urinary sxs, abdominal pain and no N/V. No falls since her hospitalization. Orthostatics are negative today. She does endorse active GERD sxs and poor primary peristalsis on Barium Esophagram on 11/22. Taking pantoprazole  but ran out of Famotidine. Has not followed with Dr. Dulce Sellar at [facility].   -BMP today  -FU with Dr. Dulce Sellar at [facility]

## 2022-12-31 LAB — BMP8+ANION GAP
Anion Gap: 14 mmol/L (ref 10.0–18.0)
BUN/Creatinine Ratio: 11 — ABNORMAL LOW (ref 12–28)
BUN: 22 mg/dL (ref 8–27)
CO2: 19 mmol/L — ABNORMAL LOW (ref 20–29)
Calcium: 9 mg/dL (ref 8.7–10.3)
Chloride: 107 mmol/L — ABNORMAL HIGH (ref 96–106)
Creatinine, Ser: 1.92 mg/dL — ABNORMAL HIGH (ref 0.57–1.00)
Glucose: 114 mg/dL — ABNORMAL HIGH (ref 70–99)
Potassium: 3.8 mmol/L (ref 3.5–5.2)
Sodium: 140 mmol/L (ref 134–144)
eGFR: 28 mL/min/{1.73_m2} — ABNORMAL LOW (ref >59)

## 2023-01-03 NOTE — Progress Notes (Signed)
Notified patient of results indicating her AKI. She has been taking her lisinopril, instructed her to stop and drink water. She has an appointment with Dr. Sherrilee Gilles on Friday and will follow up then.

## 2023-01-06 ENCOUNTER — Ambulatory Visit (INDEPENDENT_AMBULATORY_CARE_PROVIDER_SITE_OTHER): Payer: 59 | Admitting: Student

## 2023-01-06 VITALS — BP 112/58 | HR 69 | Temp 98.0°F | Ht 68.0 in | Wt 207.6 lb

## 2023-01-06 DIAGNOSIS — N179 Acute kidney failure, unspecified: Secondary | ICD-10-CM

## 2023-01-06 DIAGNOSIS — H401132 Primary open-angle glaucoma, bilateral, moderate stage: Secondary | ICD-10-CM | POA: Diagnosis not present

## 2023-01-06 DIAGNOSIS — I129 Hypertensive chronic kidney disease with stage 1 through stage 4 chronic kidney disease, or unspecified chronic kidney disease: Secondary | ICD-10-CM

## 2023-01-06 DIAGNOSIS — N183 Chronic kidney disease, stage 3 unspecified: Secondary | ICD-10-CM

## 2023-01-06 DIAGNOSIS — E1122 Type 2 diabetes mellitus with diabetic chronic kidney disease: Secondary | ICD-10-CM

## 2023-01-06 NOTE — Assessment & Plan Note (Signed)
See AKI problem tab. Repeating BMP today.

## 2023-01-06 NOTE — Progress Notes (Signed)
CC: 1 week follow-up  HPI:  [name] is a 67 y.o. female living with a history stated below and presents today for 1 week follow-up. Please see problem based assessment and plan for additional details.  Past Medical History:  Diagnosis Date   Arthritis    Chronic normocytic anemia    CKD (chronic kidney disease) stage 3, GFR 30-59 ml/min (HCC) 02/02/2013   Diabetes mellitus type II, controlled (HCC) 06/17/2009   Diabetic ulcer of both feet (HCC) 01/07/2010   Qualifier: Diagnosis of  By: [name] MD, Amanjot     Essential hypertension 06/17/2009   GERD (gastroesophageal reflux disease)    Glaucoma    H/O hiatal hernia    Hardware complicating wound infection (HCC) 12/28/2020   Hyperlipidemia    MRSA (methicillin resistant Staphylococcus aureus)    Obstructive sleep apnea 05/26/2010   Polymicrobial bacterial infection 12/28/2020    Current Outpatient Medications on File Prior to Visit  Medication Sig Dispense Refill   Accu-Chek Softclix Lancets lancets USE ONE LANCET TO TEST THREE TIMES A DAY 100 each 12   acetaminophen (TYLENOL) 500 MG tablet Take 500-1,000 mg by mouth every 6 (six) hours as needed for moderate pain or headache.     Ascorbic Acid (VITAMIN C) 1000 MG tablet Take 1,000 mg by mouth daily. Rose hips     atorvastatin (LIPITOR) 40 MG tablet Take 1 tablet (40 mg total) by mouth daily. 90 tablet 3   Biotin 5000 MCG CAPS Take 5,000 mcg by mouth daily.     Blood Glucose Monitoring Suppl (ACCU-CHEK AVIVA PLUS) w/Device KIT Use to monitor blood sugars 1 kit 0   Blood Pressure Monitoring (ADULT BLOOD PRESSURE CUFF LG) KIT Use to monitor your BP daily and bring log during each visit 1 kit 0   cycloSPORINE (RESTASIS) 0.05 % ophthalmic emulsion Place 1 drop into both eyes 2 (two) times daily. 8 am and 1700     Emollient (BAG BALM) OINT Apply to feet twice daily 240 g 5   famotidine (PEPCID) 20 MG tablet TAKE 1 TABLET BY MOUTH DAILY 15 tablet 0   ferrous sulfate 325  (65 FE) MG tablet Take 1 tablet every other day with breakfast 90 tablet 1   glucose blood (ACCU-CHEK GUIDE) test strip USE ONE STRIP TO TEST THREE TIMES A DAY 100 strip 2   lidocaine (LIDODERM) 5 % Place 1 patch onto the skin daily. Remove & Discard patch within 12 hours or as directed by MD 30 patch 0   metFORMIN (GLUCOPHAGE-XR) 500 MG 24 hr tablet Take 1 tablet (500 mg total) by mouth daily. 30 tablet 11   Netarsudil-Latanoprost (ROCKLATAN) 0.02-0.005 % SOLN Place 1 drop into both eyes daily at 10 pm.     OZEMPIC, 0.25 OR 0.5 MG/DOSE, 2 MG/3ML SOPN DIAL AND INJECT UNDER THE SKIN 0.5 MG WEEKLY 3 mL 1   pantoprazole (PROTONIX) 20 MG tablet Take 1 tablet (20 mg total) by mouth 2 (two) times daily. 180 tablet 2   polyethylene glycol powder (GLYCOLAX/MIRALAX) 17 GM/SCOOP powder Mix as directed and take 17 g by mouth 2 (two) times daily. 238 g 0   Polyvinyl Alcohol-Povidone (REFRESH OP) Place 1 drop into both eyes daily as needed (dry eyes).     senna-docusate (SENOKOT-S) 8.6-50 MG tablet Take 1 tablet by mouth at bedtime. 14 tablet 0   SIMBRINZA 1-0.2 % SUSP Place into both eyes in the morning and at bedtime.     [DISCONTINUED] pravastatin (PRAVACHOL)  20 MG tablet Take 1 tablet (20 mg total) by mouth daily. 30 tablet 6   No current facility-administered medications on file prior to visit.    Family History  Problem Relation Age of Onset   Diabetes Mother    Hypertension Mother    Dementia Mother    Heart disease Father    Stroke Father    Diabetes Father    Hypertension Father    Gout Father    Deep vein thrombosis Father    Breast cancer Cousin    Breast cancer Cousin     Social History   Socioeconomic History   Marital status: Single    Spouse name: Not on file   Number of children: Not on file   Years of education: Not on file   Highest education level: Not on file  Occupational History   Occupation: Disabled  Tobacco Use   Smoking status: Former    Current packs/day: 0.00     Types: Cigarettes    Start date: 12/22/1972    Quit date: 12/22/1972    Years since quitting: 50.0   Smokeless tobacco: Never  Vaping Use   Vaping status: Never Used  Substance and Sexual Activity   Alcohol use: No   Drug use: No   Sexual activity: Not Currently  Other Topics Concern   Not on file  Social History Narrative   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   Smoked when she was a teenager   no alcohol   no Drugs      Current Social History 10/26/2018        Patient lives on the main level of a 3 floor boarding house with 3 other tenants. There are 3 steps with handrails up to the entrance the patient uses.       Patient's method of transportation is personal car.      The highest level of education was Bachelor's Degree.      The patient currently disabled.      Identified important Relationships are "My cousins, [name] and Lianne Moris, and my dear friend Mliss Sax."      Pets : None       Interests / Fun: "I love to read, write, photography: go around [place] taking photos of the [address], Pop[employer]Art, Art Museums, cooking, hanging out with friends, several ministries with my Church, especially working with the youth."      Current Stressors: "Worrying about someone to care for me if anything happens to me. 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[place]atte, BSN, RN-BC          Social Drivers of Health   Financial Resource Strain: Low Risk  (10/26/2022)   Overall Financial Resource Strain (CARDIA)    Difficulty of Paying Living Expenses: Not very hard  Food Insecurity: No Food Insecurity (12/07/2022)   Hunger Vital Sign    Worried About Running Out of Food in the Last Year: Never true    Ran Out of Food in the Last Year: Never true  Transportation Needs: No Transportation Needs (12/07/2022)   PRAPARE - Scientist, research (physical sciences) (Medical): No    Lack of Transportation (Non-Medical): No  Physical Activity: Inactive (10/26/2022)   Exercise Vital Sign    Days of Exercise per Week: 0 days  Minutes of Exercise per Session: 0 min  Stress: No Stress Concern Present (10/26/2022)   [employer] of Occupational Health - Occupational Stress Questionnaire    Feeling of Stress : Only a little  Social Connections: Moderately Integrated (10/26/2022)   Social Connection and Isolation Panel [NHANES]    Frequency of Communication with Friends and Family: More than three times a week    Frequency of Social Gatherings with Friends and Family: Three times a week    Attends Religious Services: More than 4 times per year    Active Member of Clubs or Organizations: Yes    Attends Banker Meetings: More than 4 times per year    Marital Status: Never married  Intimate Partner Violence: Not At Risk (12/07/2022)   Humiliation, Afraid, Rape, and Kick questionnaire    Fear of Current or Ex-Partner: No    Emotionally Abused: No    Physically Abused: No    Sexually Abused: No    Review of Systems: ROS negative except for what is noted on the assessment and plan.  Vitals:   01/06/23 1028 01/06/23 1102  BP: (!) 150/74 (!) 112/58  Pulse: 90 69  Temp: 98 F (36.7 C)   TempSrc: Oral   SpO2: 100%   Weight: 207 lb 9.6 oz (94.2 kg)   Height: 5\' 8"  (1.727 m)    Physical Exam: Constitutional: well-appearing female sitting in chair comfortably, in no acute distress Cardiovascular: regular rate and rhythm Pulmonary/Chest: normal work of breathing on room air Neurological: alert & oriented x 3 Psych: normal mood and affect  Assessment & Plan:   Hypertension associated with stage 3 chronic kidney disease due to type 2 diabetes mellitus (HCC) Repeat BP improved to 112/58.  Stopped lisinopril 3 days ago per Dr. Salvatore Marvel given concern for AKI.  Today denies any hypotensive or hypertensive symptoms. Hold  lisinopril until recheck renal function, also may want to reassess BP at home and next OV given repeat today at 112/58.  Plan -Continue holding lisinopril until repeat BMP results  AKI (acute kidney injury) (HCC) Presents for 1 week follow-up for repeat BMP.  AKI on CKD 3B noted initially during hospitalization for hypercalcemia.  Baseline prior to that was between 1.4-1.5, GFR ~40.  Discharged with creatinine at 2.0 and last week it was 1.9 with GFR 28.  Denies any urinary symptoms or acute changes since last OV.  Lisinopril was held 3-4 days ago and encouraged increased fluid ntake.  Question whether if this is still AKI or possibly her new renal baseline.  Plan -Repeat BMP today Continue holding lisinopril  CKD stage 3 due to type 2 diabetes mellitus (HCC) See AKI problem tab. Repeating BMP today.    Patient discussed with Dr. Docia Furl, D.O. [facility], PGY-2 Phone: [phone] Date 01/06/2023 Time 2:19 PM

## 2023-01-06 NOTE — Assessment & Plan Note (Signed)
Repeat BP improved to 112/58.  Stopped lisinopril 3 days ago per Dr. Salvatore Marvel given concern for AKI.  Today denies any hypotensive or hypertensive symptoms. Hold lisinopril until recheck renal function, also may want to reassess BP at home and next OV given repeat today at 112/58.  Plan -Continue holding lisinopril until repeat BMP results

## 2023-01-06 NOTE — Patient Instructions (Addendum)
Thank you, [name] for allowing [place] to provide your care today. Today we discussed your kidneys.  -Will recheck kidney function and electrolytes today, call with results likely early next week -Blood pressure improved on recheck to 112/58 -Continue holding your lisinopril  -Continue to stay hydrated   I have ordered the following labs for you: Lab Orders         BMP8+Anion Gap       Follow up:  2-4 weeks    Should you have any questions or concerns please call the internal medicine clinic at [phone].    Rana Snare, D.O. [facility]

## 2023-01-06 NOTE — Assessment & Plan Note (Addendum)
Presents for 1 week follow-up for repeat BMP.  AKI on CKD 3B noted initially during hospitalization for hypercalcemia.  Baseline prior to that was between 1.4-1.5, GFR ~40.  Discharged with creatinine at 2.0 and last week it was 1.9 with GFR 28.  Denies any urinary symptoms or acute changes since last OV.  Lisinopril was held 3-4 days ago and encouraged increased fluid ntake.  Question whether if this is still AKI or possibly her new renal baseline.  Plan -Repeat BMP today Continue holding lisinopril

## 2023-01-08 LAB — BMP8+ANION GAP
Anion Gap: 15 mmol/L (ref 10.0–18.0)
BUN/Creatinine Ratio: 11 — ABNORMAL LOW (ref 12–28)
BUN: 20 mg/dL (ref 8–27)
CO2: 18 mmol/L — ABNORMAL LOW (ref 20–29)
Calcium: 8.6 mg/dL — ABNORMAL LOW (ref 8.7–10.3)
Chloride: 108 mmol/L — ABNORMAL HIGH (ref 96–106)
Creatinine, Ser: 1.85 mg/dL — ABNORMAL HIGH (ref 0.57–1.00)
Glucose: 146 mg/dL — ABNORMAL HIGH (ref 70–99)
Potassium: 3.6 mmol/L (ref 3.5–5.2)
Sodium: 141 mmol/L (ref 134–144)
eGFR: 30 mL/min/{1.73_m2} — ABNORMAL LOW (ref >59)

## 2023-01-09 NOTE — Progress Notes (Signed)
Internal Medicine Clinic Attending  Case discussed with the resident at the time of the visit.  We reviewed the resident's history and exam and pertinent patient test results.  I agree with the assessment, diagnosis, and plan of care documented in the resident's note.  

## 2023-01-13 NOTE — Progress Notes (Signed)
Internal Medicine Clinic Attending  I was physically present during the key portions of the resident provided service and participated in the medical decision making of patient's management care. I reviewed pertinent patient test results.  The assessment, diagnosis, and plan were formulated together and I agree with the documentation in the resident's note.  Patient well known to me from prior hospitalization.  As noted, hypercalcemia was due to excess exogenous calcium.  Lisinopril held due to both positional dizziness and AKI during hospitalization,which was related to calciuresis and therapeutic diuresis.  The poor esophageal motility was identified during eval for significant dysphagia and regurgitation during hospitalization.    Miguel Aschoff, MD

## 2023-01-16 ENCOUNTER — Other Ambulatory Visit: Payer: Self-pay | Admitting: Student

## 2023-01-16 DIAGNOSIS — K219 Gastro-esophageal reflux disease without esophagitis: Secondary | ICD-10-CM

## 2023-01-16 DIAGNOSIS — E66812 Obesity, class 2: Secondary | ICD-10-CM

## 2023-01-16 DIAGNOSIS — E1169 Type 2 diabetes mellitus with other specified complication: Secondary | ICD-10-CM

## 2023-01-16 NOTE — Telephone Encounter (Signed)
Medication sent to pharmacy  

## 2023-01-20 ENCOUNTER — Other Ambulatory Visit: Payer: Self-pay

## 2023-01-20 DIAGNOSIS — K219 Gastro-esophageal reflux disease without esophagitis: Secondary | ICD-10-CM

## 2023-01-20 DIAGNOSIS — R109 Unspecified abdominal pain: Secondary | ICD-10-CM | POA: Diagnosis not present

## 2023-01-20 DIAGNOSIS — R1310 Dysphagia, unspecified: Secondary | ICD-10-CM | POA: Diagnosis not present

## 2023-01-20 DIAGNOSIS — K5900 Constipation, unspecified: Secondary | ICD-10-CM | POA: Diagnosis not present

## 2023-01-20 MED ORDER — FAMOTIDINE 20 MG PO TABS: 20.0000 mg | ORAL_TABLET | Freq: Every day | ORAL | 0 refills | Status: DC

## 2023-01-23 ENCOUNTER — Other Ambulatory Visit: Payer: Self-pay

## 2023-01-23 DIAGNOSIS — I10 Essential (primary) hypertension: Secondary | ICD-10-CM

## 2023-01-23 MED ORDER — ADULT BLOOD PRESSURE CUFF LG KIT: PACK | 0 refills | Status: AC

## 2023-01-23 NOTE — Telephone Encounter (Signed)
 Medication sent to pharmacy

## 2023-01-27 ENCOUNTER — Other Ambulatory Visit: Payer: Self-pay

## 2023-01-27 DIAGNOSIS — E11610 Type 2 diabetes mellitus with diabetic neuropathic arthropathy: Secondary | ICD-10-CM

## 2023-01-27 DIAGNOSIS — M869 Osteomyelitis, unspecified: Secondary | ICD-10-CM

## 2023-01-27 MED ORDER — BAG BALM EX OINT: TOPICAL_OINTMENT | CUTANEOUS | 5 refills | Status: AC

## 2023-02-02 ENCOUNTER — Other Ambulatory Visit: Payer: Self-pay

## 2023-02-02 DIAGNOSIS — K219 Gastro-esophageal reflux disease without esophagitis: Secondary | ICD-10-CM

## 2023-02-02 MED ORDER — FAMOTIDINE 20 MG PO TABS: 20.0000 mg | ORAL_TABLET | Freq: Every day | ORAL | 1 refills | Status: DC

## 2023-02-02 NOTE — Telephone Encounter (Signed)
Refill request from the pharmacy for famotidine, pharmacy is requesting a 90 day supply.

## 2023-02-06 ENCOUNTER — Telehealth: Payer: Self-pay | Admitting: *Deleted

## 2023-02-06 NOTE — Telephone Encounter (Signed)
Returned a call from a message left on the RN office phone at the [facility] regarding PREP Class. Had attended PREP last year but was not able to complete due to health issues and surgery. She would like to take the program now. I have requested she obtain a new referral since the last one was sent 1 year ago. States she will contact her physician.

## 2023-02-07 ENCOUNTER — Telehealth: Payer: Self-pay

## 2023-02-07 MED ORDER — ACCU-CHEK GUIDE TEST VI STRP: ORAL_STRIP | 12 refills | Status: AC

## 2023-02-07 NOTE — Telephone Encounter (Signed)
Rx refill request from the pharmacy for Accu Chek test strip [facility] [id number] [name], [place] - [address]

## 2023-02-14 ENCOUNTER — Telehealth: Payer: Self-pay | Admitting: Student

## 2023-02-14 ENCOUNTER — Telehealth: Payer: Self-pay | Admitting: *Deleted

## 2023-02-14 DIAGNOSIS — E118 Type 2 diabetes mellitus with unspecified complications: Secondary | ICD-10-CM

## 2023-02-14 NOTE — Telephone Encounter (Signed)
Referral received for PREP class. She would like to participate at the [facility]. Class to begin 02/27/2023 every M/W from 1300-1415 for 12 weeks. Intake assessment appointment scheduled for Wednesday 02/22/2023 at 1430.

## 2023-02-14 NOTE — Telephone Encounter (Signed)
Rec'd a call from the pt requesting a New referral to the [facility]P Class for the [facility].  Pt states she was prev. Referred back in 2024 and had to stop attending due to an illness.   Pt is wishing to start again and Neysa Hotter, Runner, broadcasting/film/video) with the [facility] Prep Class is asking for a new referral as,the classes will start back up on 02/27/2023 and she would like to be able to register this pt.  Will you be able to place a New referral for this patient?

## 2023-02-19 NOTE — Progress Notes (Deleted)
 CC: ***  HPI:  [name] is a 68 y.o. female with a past medical history of hypertension, stage IIIb CKD, type 2 diabetes, OSA, GERD, HLD, iron deficiency anemia who presents for follow-up appointment.  Medications: Hyperlipidemia: 40 mg daily Cyclosporine eyedrops twice daily GERD: Pepcid 20 mg daily, pantoprazole 20 mg twice daily Iron deficiency anemia: Ferrous sulfate 325 mg daily Diabetes: Metformin 500 mg daily, Ozempic 0.5 mg weekly Glaucoma: Netarsudil-latanoprost eyedrops Constipation: MiraLAX daily, senna daily Simbrinza 2 times daily   Patient was last seen in the clinic on 01/06/2023.  At that time patient had a 1 week follow-up. Patient had improved blood pressure.  Patient had AKI.  Patient was held on lisinopril.  Plan was to continue lisinopril if AKI improved.  Plan is to continue to hold lisinopril.  Labs: 01/06/2023 creatinine 1.85, GFR 30, sodium 141, potassium 3.6, bicarb 18  Likely patient needs to start sodium bicarb.  Past Medical History:  Diagnosis Date   Arthritis    Chronic normocytic anemia    CKD (chronic kidney disease) stage 3, GFR 30-59 ml/min (HCC) 02/02/2013   Diabetes mellitus type II, controlled (HCC) 06/17/2009   Diabetic ulcer of both feet (HCC) 01/07/2010   Qualifier: Diagnosis of  By: [name] MD, Amanjot     Essential hypertension 06/17/2009   GERD (gastroesophageal reflux disease)    Glaucoma    H/O hiatal hernia    Hardware complicating wound infection (HCC) 12/28/2020   Hyperlipidemia    MRSA (methicillin resistant Staphylococcus aureus)    Obstructive sleep apnea 05/26/2010   Polymicrobial bacterial infection 12/28/2020     Current Outpatient Medications:    Accu-Chek Softclix Lancets lancets, USE ONE LANCET TO TEST THREE TIMES A DAY, Disp: 100 each, Rfl: 12   acetaminophen (TYLENOL) 500 MG tablet, Take 500-1,000 mg by mouth every 6 (six) hours as needed for moderate pain or headache., Disp: , Rfl:    Ascorbic  Acid (VITAMIN C) 1000 MG tablet, Take 1,000 mg by mouth daily. Rose hips, Disp: , Rfl:    atorvastatin (LIPITOR) 40 MG tablet, TAKE 1 TABLET BY MOUTH DAILY, Disp: 90 tablet, Rfl: 3   Biotin 5000 MCG CAPS, Take 5,000 mcg by mouth daily., Disp: , Rfl:    Blood Glucose Monitoring Suppl (ACCU-CHEK AVIVA PLUS) w/Device KIT, Use to monitor blood sugars, Disp: 1 kit, Rfl: 0   Blood Pressure Monitoring (ADULT BLOOD PRESSURE CUFF LG) KIT, Use to monitor your BP daily and bring log during each visit, Disp: 1 kit, Rfl: 0   cycloSPORINE (RESTASIS) 0.05 % ophthalmic emulsion, Place 1 drop into both eyes 2 (two) times daily. 8 am and 1700, Disp: , Rfl:    Emollient (BAG BALM) OINT, Apply to feet twice daily, Disp: 240 g, Rfl: 5   famotidine (PEPCID) 20 MG tablet, Take 1 tablet (20 mg total) by mouth daily., Disp: 90 tablet, Rfl: 1   ferrous sulfate 325 (65 FE) MG tablet, Take 1 tablet every other day with breakfast, Disp: 90 tablet, Rfl: 1   glucose blood (ACCU-CHEK GUIDE TEST) test strip, Use as instructed, Disp: 100 each, Rfl: 12   glucose blood (ACCU-CHEK GUIDE) test strip, USE ONE STRIP TO TEST THREE TIMES A DAY, Disp: 100 strip, Rfl: 2   lidocaine (LIDODERM) 5 %, Place 1 patch onto the skin daily. Remove & Discard patch within 12 hours or as directed by MD, Disp: 30 patch, Rfl: 0   metFORMIN (GLUCOPHAGE-XR) 500 MG 24 hr tablet, Take 1  tablet (500 mg total) by mouth daily., Disp: 30 tablet, Rfl: 11   Netarsudil-Latanoprost (ROCKLATAN) 0.02-0.005 % SOLN, Place 1 drop into both eyes daily at 10 pm., Disp: , Rfl:    OZEMPIC, 0.25 OR 0.5 MG/DOSE, 2 MG/3ML SOPN, DIAL AND INJECT UNDER THE SKIN 0.5 MG WEEKLY, Disp: 3 mL, Rfl: 1   pantoprazole (PROTONIX) 20 MG tablet, TAKE 1 TABLET BY MOUTH 2 TIMES A DAY, Disp: 180 tablet, Rfl: 2   polyethylene glycol powder (GLYCOLAX/MIRALAX) 17 GM/SCOOP powder, Mix as directed and take 17 g by mouth 2 (two) times daily., Disp: 238 g, Rfl: 0   Polyvinyl Alcohol-Povidone (REFRESH  OP), Place 1 drop into both eyes daily as needed (dry eyes)., Disp: , Rfl:    senna-docusate (SENOKOT-S) 8.6-50 MG tablet, Take 1 tablet by mouth at bedtime., Disp: 14 tablet, Rfl: 0   SIMBRINZA 1-0.2 % SUSP, Place into both eyes in the morning and at bedtime., Disp: , Rfl:   Review of Systems:  ***  Constitutional: Eye: Respiratory: Cardiovascular: GI: MSK: GU: Skin: Neuro: Endocrine:   Physical Exam:  There were no vitals filed for this visit. *** General: Patient is sitting comfortably in the room  Eyes: Pupils equal and reactive to light, EOM intact  Head: Normocephalic, atraumatic  Neck: Supple, nontender, full range of motion, No JVD Cardio: Regular rate and rhythm, no murmurs, rubs or gallops. 2+ pulses to bilateral upper and lower extremities  Chest: No chest tenderness Pulmonary: Clear to ausculation bilaterally with no rales, rhonchi, and crackles  Abdomen: Soft, nontender with normoactive bowel sounds with no rebound or guarding  Neuro: Alert and orientated x3. CN II-XII intact. Sensation intact to upper and lower extremities. 2+ patellar reflex.  Back: No midline tenderness, no step off or deformities noted. No paraspinal muscle tenderness.  Skin: No rashes noted  MSK: 5/5 strength to upper and lower extremities.    Assessment & Plan:   No problem-specific Assessment & Plan notes found for this encounter.    Patient {GC/GE:[id number]::"discussed with","seen with"} Dr. {NAMES:[id number]::"Guilloud","Hoffman","Mullen","Narendra","Williams","Vincent"}  Modena Slater, DO PGY-2 Internal Medicine Resident  Pager: [phone]

## 2023-02-20 ENCOUNTER — Encounter: Payer: 59 | Admitting: Student

## 2023-02-22 ENCOUNTER — Encounter: Payer: Self-pay | Admitting: *Deleted

## 2023-02-22 NOTE — Progress Notes (Signed)
 [facility] PREP Evaluation  Patient Details  Name: [name] MRN: [id number] Date of Birth: [birth year] Age: 68 y.o. PCP: Tobie Gaines, DO  Vitals:   02/22/23 1430  BP: (!) 162/86  Pulse: 80  Resp: 20  SpO2: 97%  Weight: 208 lb 6.4 oz (94.5 kg)  Height: 5' 8 (1.727 m)     [facility] Eval - 02/22/23 1445       [facility] PREP Location   [facility] PREP Location [facility]      Referral    Referring Provider Williams    Reason for referral Hypertension;Inactivity;Obesitity/Overweight;High Cholesterol;Diabetes    Program Start Date 02/27/23    Program End Date 05/17/23      Measurement   Waist Circumference 51.75 inches    Hip Circumference 52 inches    Body fat --   could not calculate     Information for Trainer   Goals establish an exercise routine, increase strength and stamina, weight loss 8-10lbs in 12 weeks    Current Exercise none    Orthopedic Concerns left foot charcot    Pertinent Medical History DM, HTN,OSA, CKD    Current Barriers none noted    Restrictions/Precautions Diabetic snack before exercise;Fall risk;Assistive device   uses cane and/or rolater     Mobility and Daily Activities   I find it easy to walk up or down two or more flights of stairs. 1    I have no trouble taking out the trash. 4    I do housework such as vacuuming and dusting on my own without difficulty. 4    I can easily lift a gallon of milk (8lbs). 4    I can easily walk a mile. 4    I have no trouble reaching into high cupboards or reaching down to pick up something from the floor. 3    I do not have trouble doing out-door work such as loss adjuster, chartered, raking leaves, or gardening. 1      Mobility and Daily Activities   I feel younger than my age. 3    I feel independent. 3    I feel energetic. 2    I live an active life.  3    I feel strong. 3    I feel healthy. 3    I feel active as other people my age. 3      How fit and strong are you.   Fit and Strong Total Score 41             Past Medical History:  Diagnosis Date   Arthritis    Chronic normocytic anemia    CKD (chronic kidney disease) stage 3, GFR 30-59 ml/min (HCC) 02/02/2013   Diabetes mellitus type II, controlled (HCC) 06/17/2009   Diabetic ulcer of both feet (HCC) 01/07/2010   Qualifier: Diagnosis of  By: Arvella MD, Amanjot     Essential hypertension 06/17/2009   GERD (gastroesophageal reflux disease)    Glaucoma    H/O hiatal hernia    Hardware complicating wound infection (HCC) 12/28/2020   Hyperlipidemia    MRSA (methicillin resistant Staphylococcus aureus)    Obstructive sleep apnea 05/26/2010   Polymicrobial bacterial infection 12/28/2020   Past Surgical History:  Procedure Laterality Date   ACHILLES TENDON LENGTHENING Left 11/06/2014   Procedure: LEFT ACHILLES TENDON LENGTHENING, ;  Surgeon: Norleen Armor, MD;  Location: [facility] OR;  Service: Orthopedics;  Laterality: Left;   APPENDECTOMY  1975   FOOT ARTHRODESIS Left 11/06/2014  Procedure: TALONAVICULAR CANCELLOUS CUBOID ARTHRODESIS WITH CORRECTIVE OSTEOTOMY ;  Surgeon: Norleen Armor, MD;  Location: [facility] OR;  Service: Orthopedics;  Laterality: Left;   FOOT ARTHRODESIS Right 05/14/2020   Procedure: Right heelcord lengthening, subtalar and talonavicular arthrodesis, talus exostectomy, talus corrective osteotomy;  Surgeon: Armor Norleen, MD;  Location: [facility] OR;  Service: Orthopedics;  Laterality: Right;   HARDWARE REMOVAL Left 02/19/2015   Procedure: REMOVAL OF LEFT FOOT EXTERNAL FIXATOR, CASTING OF LEFT ANKLE ;  Surgeon: Norleen Armor, MD;  Location: [facility];  Service: Orthopedics;  Laterality: Left;   HARDWARE REMOVAL Right 12/17/2020   Procedure: Removal of deep implant right foot;  Surgeon: Armor Norleen, MD;  Location: [facility] OR;  Service: Orthopedics;  Laterality: Right;   I & D EXTREMITY Left 2011   ulcer on my foot got infected (01/30/2013)   I & D EXTREMITY Right 05/06/2021   Procedure: Irrigation and excisional debridement of right  ankle abcess;  Surgeon: Armor Norleen, MD;  Location:  SURGERY CENTER;  Service: Orthopedics;  Laterality: Right;    MULTIPLE TOOTH EXTRACTIONS Bilateral    OOPHORECTOMY Bilateral 1975-1976   Social History   Tobacco Use  Smoking Status Former   Current packs/day: 0.00   Types: Cigarettes   Start date: 12/22/1972   Quit date: 12/22/1972   Years since quitting: 50.2  Smokeless Tobacco Never    [place] 02/22/2023, 9:02 PM  PREP Intake Assessment complete. No barriers to participation noted. Instructed to bring assistive device (rollator/cane) to class.

## 2023-02-24 DIAGNOSIS — T300 Burn of unspecified body region, unspecified degree: Secondary | ICD-10-CM | POA: Diagnosis not present

## 2023-02-24 DIAGNOSIS — M14672 Charcot's joint, left ankle and foot: Secondary | ICD-10-CM | POA: Diagnosis not present

## 2023-02-24 DIAGNOSIS — E1140 Type 2 diabetes mellitus with diabetic neuropathy, unspecified: Secondary | ICD-10-CM | POA: Diagnosis not present

## 2023-02-24 DIAGNOSIS — M14671 Charcot's joint, right ankle and foot: Secondary | ICD-10-CM | POA: Diagnosis not present

## 2023-02-27 ENCOUNTER — Encounter: Payer: Self-pay | Admitting: *Deleted

## 2023-02-27 NOTE — Progress Notes (Signed)
 [employer] PREP Weekly Session  Patient Details  Name: [name] MRN: [id number] Date of Birth: 1955-08-08 Age: 68 y.o. PCP: Jonelle Neri, DO  Vitals:   02/27/23 1300  Weight: 209 lb (94.8 kg)     [facility] Weekly seesion - 02/27/23 1500       [facility] "PREP" Location   [facility] "PREP" Location [facility]      Weekly Session   Topic Discussed Goal setting and welcome to the program   Introductions, text book review, perceived exertion scale, tour facility, homework: no sitting longer than 30 minutes.   Minutes exercised this week 20 minutes    Classes attended to date 1             Howard Macho 02/27/2023, 9:10 PM

## 2023-03-06 ENCOUNTER — Encounter: Payer: Self-pay | Admitting: *Deleted

## 2023-03-06 ENCOUNTER — Ambulatory Visit (INDEPENDENT_AMBULATORY_CARE_PROVIDER_SITE_OTHER): Payer: 59 | Admitting: Student

## 2023-03-06 ENCOUNTER — Encounter: Payer: Self-pay | Admitting: Student

## 2023-03-06 VITALS — BP 133/66 | HR 75 | Temp 98.0°F | Ht 69.0 in | Wt 212.4 lb

## 2023-03-06 DIAGNOSIS — N184 Chronic kidney disease, stage 4 (severe): Secondary | ICD-10-CM

## 2023-03-06 DIAGNOSIS — E1122 Type 2 diabetes mellitus with diabetic chronic kidney disease: Secondary | ICD-10-CM

## 2023-03-06 DIAGNOSIS — D509 Iron deficiency anemia, unspecified: Secondary | ICD-10-CM | POA: Diagnosis not present

## 2023-03-06 DIAGNOSIS — N183 Chronic kidney disease, stage 3 unspecified: Secondary | ICD-10-CM | POA: Diagnosis not present

## 2023-03-06 DIAGNOSIS — I129 Hypertensive chronic kidney disease with stage 1 through stage 4 chronic kidney disease, or unspecified chronic kidney disease: Secondary | ICD-10-CM | POA: Diagnosis not present

## 2023-03-06 DIAGNOSIS — Z7984 Long term (current) use of oral hypoglycemic drugs: Secondary | ICD-10-CM

## 2023-03-06 DIAGNOSIS — E118 Type 2 diabetes mellitus with unspecified complications: Secondary | ICD-10-CM | POA: Diagnosis not present

## 2023-03-06 MED ORDER — EMPAGLIFLOZIN 10 MG PO TABS: 10.0000 mg | ORAL_TABLET | Freq: Every day | ORAL | 2 refills | Status: DC

## 2023-03-06 NOTE — Patient Instructions (Addendum)
Thank you, [name] for allowing [place] to provide your care today. Today we discussed your blood pressure, diabetes and kidney disease.    Your last hemoglobin A1c was 5.3, which is excellent.  You may keep taking metformin daily.  You are doing a good job with modifying your diet to incorporate less simple sugars and more substance of foods.  Keep it up!  Your blood pressure was well-controlled at 133/66 today.  This has improved a lot with your weight loss and dietary/exercise efforts.  We will recheck your basic metabolic panel, including your kidney function, and an iron panel today.  I will call you as soon as we have the results.  If your kidney function is still elevated, I will recommend starting a medication called an SGLT2 inhibitor, which helps both your blood sugar and your kidney function.  I have ordered the following labs for you:   Lab Orders         BMP8+Anion Gap         Iron, TIBC and Ferritin Panel       Follow up: 3 months   We look forward to seeing you next time. Please call our clinic at [phone] if you have any questions or concerns. The best time to call is Monday-Friday from 9am-4pm, but there is someone available 24/7. If after hours or the weekend, call the main hospital number and ask for the Internal Medicine Resident On-Call. If you need medication refills, please notify your pharmacy one week in advance and they will send [place] a request.   Thank you for trusting me with your care. Wishing you the best!   Annett Fabian, MD [facility]

## 2023-03-06 NOTE — Assessment & Plan Note (Addendum)
Blood pressure is well-controlled today at 133/66.  Lisinopril was previously held in the setting of an AKI during her November hospitalization.  We will have her record her blood pressure at home and we will recheck a BMP today.  We will reassess her BP at her next visit. She may benefit from addition of an ACE/ARB given her CKD.  Addendum: GFR remains less than 30, I called the patient and asked her to restart her lisinopril as she was previously taking it.  I will place a referral to nephrology as I am concerned that her kidney disease has now progressed into stage IV chronic kidney disease.

## 2023-03-06 NOTE — Assessment & Plan Note (Addendum)
Last hemoglobin A1c was 5.3 in December, well-controlled.  She is only taking metformin 500 mg daily.  She previously took Ozempic, however ended up with gastroparesis and was hospitalized for a bowel obstruction, so she stopped this.  She has lost a total of 70 pounds with Ozempic and dietary changes/exercise.  She continues to make dietary changes incorporating more vegetables and less simple sugars into her diet.  She has also been going to the [facility] frequently to exercise, which she says has made her feel better. She is agreeable to start Jardiance 10 mg daily for her glycemic control and to reduce progression of her CKD. We will check a BMP today.

## 2023-03-06 NOTE — Progress Notes (Signed)
 Internal Medicine Clinic Attending  Case discussed with the resident at the time of the visit.  We reviewed the resident's history and exam and pertinent patient test results.  I agree with the assessment, diagnosis, and plan of care documented in the resident's note.

## 2023-03-06 NOTE — Progress Notes (Signed)
[facility] PRE[facility] Weekly Session  Patient Details  Name: [name] MRN: [id number] Date of Birth: [birth year] Age: 68 y.o. PCP: Modena Slater, DO  Vitals:   03/06/23 1651  Weight: 211 lb (95.7 kg)     [facility] Weekly seesion - 03/06/23 1500       [facility] "PREP" Location   [facility]PREP[facility] Location [facility]      Weekly Session   Topic Discussed Other ways to be active;Importance of resistance training   Discussed components of a balanced fitness plan, benefits of cardio and strength training, national standards 150 mins/week of cardio, strength training 2-4 days/week for 20-40 minutes per session.   Minutes exercised this week 20 minutes    Classes attended to date 3             Remo Lipps 03/06/2023, 4:52 PM

## 2023-03-06 NOTE — Assessment & Plan Note (Signed)
Previous history of iron deficiency anemia.  Taking ferrous sulfate 325 mg every other day.  She would like to recheck her iron today, which I think is reasonable.  We will collect an iron panel.

## 2023-03-06 NOTE — Progress Notes (Signed)
CC: routine f/u  HPI:  [name] is a 68 y.o. female living with a history stated below and presents today for routine follow up visit. Please see problem based assessment and plan for additional details.  Past Medical History:  Diagnosis Date   Arthritis    Chronic normocytic anemia    CKD (chronic kidney disease) stage 3, GFR 30-59 ml/min (HCC) 02/02/2013   Diabetes mellitus type II, controlled (HCC) 06/17/2009   Diabetic ulcer of both feet (HCC) 01/07/2010   Qualifier: Diagnosis of  By: [name] MD, Amanjot     Essential hypertension 06/17/2009   GERD (gastroesophageal reflux disease)    Glaucoma    H/O hiatal hernia    Hardware complicating wound infection (HCC) 12/28/2020   Hyperlipidemia    MRSA (methicillin resistant Staphylococcus aureus)    Obstructive sleep apnea 05/26/2010   Polymicrobial bacterial infection 12/28/2020    Current Outpatient Medications on File Prior to Visit  Medication Sig Dispense Refill   Accu-Chek Softclix Lancets lancets USE ONE LANCET TO TEST THREE TIMES A DAY 100 each 12   acetaminophen (TYLENOL) 500 MG tablet Take 500-1,000 mg by mouth every 6 (six) hours as needed for moderate pain or headache.     Ascorbic Acid (VITAMIN C) 1000 MG tablet Take 1,000 mg by mouth daily. Rose hips     atorvastatin (LIPITOR) 40 MG tablet TAKE 1 TABLET BY MOUTH DAILY 90 tablet 3   Biotin 5000 MCG CAPS Take 5,000 mcg by mouth daily.     Blood Glucose Monitoring Suppl (ACCU-CHEK AVIVA PLUS) w/Device KIT Use to monitor blood sugars 1 kit 0   Blood Pressure Monitoring (ADULT BLOOD PRESSURE CUFF LG) KIT Use to monitor your BP daily and bring log during each visit 1 kit 0   cycloSPORINE (RESTASIS) 0.05 % ophthalmic emulsion Place 1 drop into both eyes 2 (two) times daily. 8 am and 1700     Emollient (BAG BALM) OINT Apply to feet twice daily 240 g 5   famotidine (PEPCID) 20 MG tablet Take 1 tablet (20 mg total) by mouth daily. 90 tablet 1   ferrous sulfate 325  (65 FE) MG tablet Take 1 tablet every other day with breakfast 90 tablet 1   glucose blood (ACCU-CHEK GUIDE TEST) test strip Use as instructed 100 each 12   glucose blood (ACCU-CHEK GUIDE) test strip USE ONE STRIP TO TEST THREE TIMES A DAY 100 strip 2   lidocaine (LIDODERM) 5 % Place 1 patch onto the skin daily. Remove & Discard patch within 12 hours or as directed by MD 30 patch 0   metFORMIN (GLUCOPHAGE-XR) 500 MG 24 hr tablet Take 1 tablet (500 mg total) by mouth daily. 30 tablet 11   Netarsudil-Latanoprost (ROCKLATAN) 0.02-0.005 % SOLN Place 1 drop into both eyes daily at 10 pm.     OZEMPIC, 0.25 OR 0.5 MG/DOSE, 2 MG/3ML SOPN DIAL AND INJECT UNDER THE SKIN 0.5 MG WEEKLY 3 mL 1   pantoprazole (PROTONIX) 20 MG tablet TAKE 1 TABLET BY MOUTH 2 TIMES A DAY 180 tablet 2   polyethylene glycol powder (GLYCOLAX/MIRALAX) 17 GM/SCOOP powder Mix as directed and take 17 g by mouth 2 (two) times daily. 238 g 0   Polyvinyl Alcohol-Povidone (REFRESH OP) Place 1 drop into both eyes daily as needed (dry eyes).     senna-docusate (SENOKOT-S) 8.6-50 MG tablet Take 1 tablet by mouth at bedtime. 14 tablet 0   SIMBRINZA 1-0.2 % SUSP Place into both eyes in  the morning and at bedtime.     [DISCONTINUED] pravastatin (PRAVACHOL) 20 MG tablet Take 1 tablet (20 mg total) by mouth daily. 30 tablet 6   No current facility-administered medications on file prior to visit.    Family History  Problem Relation Age of Onset   Diabetes Mother    Hypertension Mother    Dementia Mother    Heart disease Father    Stroke Father    Diabetes Father    Hypertension Father    Gout Father    Deep vein thrombosis Father    Breast cancer Cousin    Breast cancer Cousin     Social History   Socioeconomic History   Marital status: Single    Spouse name: Not on file   Number of children: Not on file   Years of education: Not on file   Highest education level: Not on file  Occupational History   Occupation: Disabled   Tobacco Use   Smoking status: Former    Current packs/day: 0.00    Types: Cigarettes    Start date: 12/22/1972    Quit date: 12/22/1972    Years since quitting: 50.2   Smokeless tobacco: Never  Vaping Use   Vaping status: Never Used  Substance and Sexual Activity   Alcohol use: No   Drug use: No   Sexual activity: Not Currently  Other Topics Concern   Not on file  Social History Narrative   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   Smoked when she was a teenager   no alcohol   no Drugs      Current Social History 10/26/2018        Patient lives on the main level of a 3 floor boarding house with 3 other tenants. There are 3 steps with handrails up to the entrance the patient uses.       Patient's method of transportation is personal car.      The highest level of education was Bachelor's Degree.      The patient currently disabled.      Identified important Relationships are "My cousins, [name] and Lianne Moris, and my dear friend Mliss Sax."      Pets : None       Interests / Fun: "I love to read, write, photography: go around [place] taking photos of the [address], [employer], Art Museums, cooking, hanging out with friends, several ministries with my Church, especially working with the youth."      Current Stressors: "Worrying about someone to care for me if anything happens to me. 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[place], BSN, RN-BC          Social Drivers of Health   Financial Resource Strain: Low Risk  (10/26/2022)   Overall Financial Resource Strain (CARDIA)    Difficulty of Paying Living Expenses: Not very hard  Food Insecurity: No Food Insecurity (12/07/2022)   Hunger Vital Sign    Worried About Running Out of Food in the Last Year: Never true    Ran Out of Food in the Last Year: Never true  Transportation Needs: No  Transportation Needs (12/07/2022)   PRAPARE - Administrator, Civil Service (Medical): No    Lack of Transportation (Non-Medical): No  Physical Activity: Inactive (10/26/2022)   Exercise Vital Sign  Days of Exercise per Week: 0 days    Minutes of Exercise per Session: 0 min  Stress: No Stress Concern Present (10/26/2022)   [employer] of Occupational Health - Occupational Stress Questionnaire    Feeling of Stress : Only a little  Social Connections: Moderately Integrated (10/26/2022)   Social Connection and Isolation Panel [NHANES]    Frequency of Communication with Friends and Family: More than three times a week    Frequency of Social Gatherings with Friends and Family: Three times a week    Attends Religious Services: More than 4 times per year    Active Member of Clubs or Organizations: Yes    Attends Banker Meetings: More than 4 times per year    Marital Status: Never married  Intimate Partner Violence: Not At Risk (12/07/2022)   Humiliation, Afraid, Rape, and Kick questionnaire    Fear of Current or Ex-Partner: No    Emotionally Abused: No    Physically Abused: No    Sexually Abused: No   Review of Systems: ROS negative except for what is noted on the assessment and plan.  Vitals:   03/06/23 0908  BP: 133/66  Pulse: 75  Temp: 98 F (36.7 C)  TempSrc: Oral  SpO2: 100%  Weight: 212 lb 6.4 oz (96.3 kg)  Height: 5\' 9"  (1.753 m)   Physical Exam: Well-appearing woman Heart rate and rhythm regular, no murmurs, no lower extremity edema Lungs clear to auscultation bilaterally, normal rate and respiratory effort Alert and oriented, no focal neurologic deficits  Assessment & Plan:   Patient discussed with Dr. Deirdre Priest  Hypertension associated with stage 3 chronic kidney disease due to type 2 diabetes mellitus (HCC) Blood pressure is well-controlled today at 133/66.  Lisinopril was previously held in the setting of an AKI during her November  hospitalization.  We will have her record her blood pressure at home and we will recheck a BMP today.  We will reassess her BP at her next visit. She may benefit from addition of an ACE/ARB given her CKD.  Diabetes mellitus type 2, controlled, with complications (HCC) Last hemoglobin A1c was 5.3 in December, well-controlled.  She is only taking metformin 500 mg daily.  She previously took Ozempic, however ended up with gastroparesis and was hospitalized for a bowel obstruction, so she stopped this.  She has lost a total of 70 pounds with Ozempic and dietary changes/exercise.  She continues to make dietary changes incorporating more vegetables and less simple sugars into her diet.  She has also been going to the [facility] frequently to exercise, which she says has made her feel better. She is agreeable to start Jardiance 10 mg daily for her glycemic control and to reduce progression of her CKD. We will check a BMP today.  Iron deficiency anemia Previous history of iron deficiency anemia.  Taking ferrous sulfate 325 mg every other day.  She would like to recheck her iron today, which I think is reasonable.  We will collect an iron panel.  Annett Fabian, MD [facility] Internal Medicine, PGY-1 Phone: [phone] Date 03/06/2023 Time 12:08 PM

## 2023-03-07 LAB — IRON,TIBC AND FERRITIN PANEL
Ferritin: 126 ng/mL (ref 15–150)
Iron Saturation: 19 % (ref 15–55)
Iron: 39 ug/dL (ref 27–139)
Total Iron Binding Capacity: 205 ug/dL — ABNORMAL LOW (ref 250–450)
UIBC: 166 ug/dL (ref 118–369)

## 2023-03-07 LAB — BMP8+ANION GAP
Anion Gap: 15 mmol/L (ref 10.0–18.0)
BUN/Creatinine Ratio: 14 (ref 12–28)
BUN: 27 mg/dL (ref 8–27)
CO2: 19 mmol/L — ABNORMAL LOW (ref 20–29)
Calcium: 9.1 mg/dL (ref 8.7–10.3)
Chloride: 106 mmol/L (ref 96–106)
Creatinine, Ser: 1.95 mg/dL — ABNORMAL HIGH (ref 0.57–1.00)
Glucose: 150 mg/dL — ABNORMAL HIGH (ref 70–99)
Potassium: 4.3 mmol/L (ref 3.5–5.2)
Sodium: 140 mmol/L (ref 134–144)
eGFR: 28 mL/min/{1.73_m2} — ABNORMAL LOW (ref >59)

## 2023-03-08 ENCOUNTER — Telehealth: Payer: Self-pay

## 2023-03-08 NOTE — Telephone Encounter (Signed)
Patient called she is requesting her lab results please return patients call.

## 2023-03-10 ENCOUNTER — Other Ambulatory Visit: Payer: Self-pay | Admitting: Student

## 2023-03-10 MED ORDER — LISINOPRIL 20 MG PO TABS: 20.0000 mg | ORAL_TABLET | Freq: Every day | ORAL | 11 refills | Status: DC

## 2023-03-10 NOTE — Addendum Note (Signed)
Addended by: Annett Fabian on: 03/10/2023 09:19 AM   Modules accepted: Orders

## 2023-03-13 ENCOUNTER — Encounter: Payer: Self-pay | Admitting: *Deleted

## 2023-03-13 NOTE — Progress Notes (Signed)
 [facility] PRE[facility] Weekly Session  Patient Details  Name: [name] MRN: [id number] Date of Birth: 01-10-1956 Age: 68 y.o. PCP: Modena Slater, DO  Vitals:   03/13/23 1300  Weight: 211 lb (95.7 kg)     [facility] Weekly seesion - 03/13/23 1500       [facility] "PREP" Location   [facility] "PREP" Location [facility]      Weekly Session   Topic Discussed Healthy eating tips   Healthy carbohydrates, fats and proteins. Keeping added sugars to 24g/day for women, 36g/day for men. Sodium to 1500-2400mg /day.   Minutes exercised this week 35 minutes    Classes attended to date 4             Remo Lipps 03/13/2023, 7:57 PM

## 2023-03-14 DIAGNOSIS — M14672 Charcot's joint, left ankle and foot: Secondary | ICD-10-CM | POA: Diagnosis not present

## 2023-03-14 DIAGNOSIS — M14671 Charcot's joint, right ankle and foot: Secondary | ICD-10-CM | POA: Diagnosis not present

## 2023-03-14 DIAGNOSIS — E1140 Type 2 diabetes mellitus with diabetic neuropathy, unspecified: Secondary | ICD-10-CM | POA: Diagnosis not present

## 2023-03-14 DIAGNOSIS — T300 Burn of unspecified body region, unspecified degree: Secondary | ICD-10-CM | POA: Diagnosis not present

## 2023-03-20 ENCOUNTER — Encounter: Payer: Self-pay | Admitting: *Deleted

## 2023-03-20 NOTE — Progress Notes (Signed)
 [facility] PRE[facility] Weekly Session  Patient Details  Name: [name] MRN: [id number] Date of Birth: 1955/09/03 Age: 68 y.o. PCP: Modena Slater, DO  Vitals:   03/20/23 1300  Weight: 210 lb (95.3 kg)     [facility] Weekly seesion - 03/20/23 1500       [facility] "PREP" Location   [facility] "PREP" Location [facility]      Weekly Session   Topic Discussed Health habits;Water   Staying motivted, addicting nature of sugar, tips for sugar reduction, liquid sugar, hidden sugar, sugar demo   Minutes exercised this week 100 minutes    Classes attended to date 6             Remo Lipps 03/20/2023, 7:58 PM

## 2023-03-27 ENCOUNTER — Encounter: Payer: Self-pay | Admitting: *Deleted

## 2023-03-27 NOTE — Progress Notes (Signed)
 [facility] PRE[facility] Weekly Session  Patient Details  Name: MAHROSH DONNELL MRN: [id number] Date of Birth: 1955-07-24 Age: 68 y.o. PCP: Modena Slater, DO  Vitals:   03/27/23 1442  Weight: 202 lb (91.6 kg)     [facility] Weekly seesion - 03/27/23 1400       [facility] "PREP" Location   [facility]      Weekly Session   Topic Discussed Restaurant Eating   Sodium intake 1500-2300mg /day. Effects of sodium on your body, salt demo   Minutes exercised this week 85 minutes    Classes attended to date 7             Remo Lipps 03/27/2023, 2:43 PM

## 2023-03-30 ENCOUNTER — Telehealth: Payer: Self-pay | Admitting: Student

## 2023-03-30 NOTE — Telephone Encounter (Signed)
 Copied from CRM [id number]. Topic: Appointments - Appointment Scheduling >> Mar 29, 2023 11:53 AM Hamdi H wrote: Patient is calling to schedule an appointment. She wanted to see dietician Plyler, Cecil Cranker, RD again to get nutritional help with her diabetes and new kidney issues. I was unable to schedule her, pt is waiting for a call back to get this scheduled.

## 2023-04-03 ENCOUNTER — Encounter: Payer: Self-pay | Admitting: *Deleted

## 2023-04-03 NOTE — Progress Notes (Signed)
 [facility] PRE[facility] Weekly Session  Patient Details  Name: [name] MRN: [id number] Date of Birth: 11-17-1955 Age: 68 y.o. PCP: Modena Slater, DO  Vitals:   04/03/23 1445  Weight: 202 lb (91.6 kg)     [facility] Weekly seesion - 04/03/23 1400       [facility] "PREP" Location   [facility]PREP[facility] Location [facility]      Weekly Session   Topic Discussed Stress management and problem solving   Importance of sleep and stratagies to improve sleep quality, meditation exercise.   Minutes exercised this week 90 minutes    Classes attended to date 82             Remo Lipps 04/03/2023, 2:46 PM

## 2023-04-07 DIAGNOSIS — H401132 Primary open-angle glaucoma, bilateral, moderate stage: Secondary | ICD-10-CM | POA: Diagnosis not present

## 2023-04-10 ENCOUNTER — Encounter: Payer: Self-pay | Admitting: *Deleted

## 2023-04-10 NOTE — Progress Notes (Signed)
 [facility] PRE[facility] Weekly Session  Patient Details  Name: [name] MRN: [id number] Date of Birth: 09-Jan-1956 Age: 68 y.o. PCP: Modena Slater, DO  Vitals:   04/10/23 1436  Weight: 201 lb (91.2 kg)     [facility] Weekly seesion - 04/10/23 1400       [facility] "PREP" Location   [facility] "PREP" Location [facility]      Weekly Session   Topic Discussed Expectations and non-scale victories   Halfway through! Restate and reset goals. Permanent objectives. Discuss Blue zones.   Minutes exercised this week 145 minutes    Classes attended to date 77             Remo Lipps 04/10/2023, 2:37 PM

## 2023-04-12 ENCOUNTER — Emergency Department (HOSPITAL_COMMUNITY): Admission: EM | Admit: 2023-04-12 | Disposition: A | Discharge status: Still In Hospital | Source: Home / Self Care

## 2023-04-12 ENCOUNTER — Observation Stay (HOSPITAL_COMMUNITY)
Admission: EM | Admit: 2023-04-12 | Discharge: 2023-04-13 | Disposition: A | Discharge status: Home / Self Care | Source: Ambulatory Visit | Attending: Infectious Diseases | Admitting: Infectious Diseases

## 2023-04-12 ENCOUNTER — Observation Stay (HOSPITAL_COMMUNITY)

## 2023-04-12 ENCOUNTER — Encounter (HOSPITAL_COMMUNITY): Payer: Self-pay

## 2023-04-12 ENCOUNTER — Ambulatory Visit: Payer: Self-pay

## 2023-04-12 ENCOUNTER — Ambulatory Visit (INDEPENDENT_AMBULATORY_CARE_PROVIDER_SITE_OTHER): Admitting: Internal Medicine

## 2023-04-12 VITALS — BP 103/52 | HR 68 | Temp 98.1°F | Ht 68.0 in | Wt 202.4 lb

## 2023-04-12 DIAGNOSIS — E861 Hypovolemia: Secondary | ICD-10-CM | POA: Diagnosis not present

## 2023-04-12 DIAGNOSIS — Z91040 Latex allergy status: Secondary | ICD-10-CM | POA: Diagnosis not present

## 2023-04-12 DIAGNOSIS — I159 Secondary hypertension, unspecified: Secondary | ICD-10-CM

## 2023-04-12 DIAGNOSIS — N183 Chronic kidney disease, stage 3 unspecified: Secondary | ICD-10-CM | POA: Diagnosis not present

## 2023-04-12 DIAGNOSIS — N1832 Chronic kidney disease, stage 3b: Secondary | ICD-10-CM | POA: Insufficient documentation

## 2023-04-12 DIAGNOSIS — R2689 Other abnormalities of gait and mobility: Secondary | ICD-10-CM | POA: Insufficient documentation

## 2023-04-12 DIAGNOSIS — E785 Hyperlipidemia, unspecified: Secondary | ICD-10-CM | POA: Diagnosis present

## 2023-04-12 DIAGNOSIS — E1122 Type 2 diabetes mellitus with diabetic chronic kidney disease: Secondary | ICD-10-CM

## 2023-04-12 DIAGNOSIS — N179 Acute kidney failure, unspecified: Secondary | ICD-10-CM

## 2023-04-12 DIAGNOSIS — Z862 Personal history of diseases of the blood and blood-forming organs and certain disorders involving the immune mechanism: Secondary | ICD-10-CM | POA: Diagnosis not present

## 2023-04-12 DIAGNOSIS — Z79899 Other long term (current) drug therapy: Secondary | ICD-10-CM | POA: Insufficient documentation

## 2023-04-12 DIAGNOSIS — I129 Hypertensive chronic kidney disease with stage 1 through stage 4 chronic kidney disease, or unspecified chronic kidney disease: Secondary | ICD-10-CM | POA: Insufficient documentation

## 2023-04-12 DIAGNOSIS — Z7984 Long term (current) use of oral hypoglycemic drugs: Secondary | ICD-10-CM | POA: Diagnosis not present

## 2023-04-12 DIAGNOSIS — K219 Gastro-esophageal reflux disease without esophagitis: Secondary | ICD-10-CM | POA: Insufficient documentation

## 2023-04-12 DIAGNOSIS — G459 Transient cerebral ischemic attack, unspecified: Secondary | ICD-10-CM | POA: Insufficient documentation

## 2023-04-12 DIAGNOSIS — R197 Diarrhea, unspecified: Secondary | ICD-10-CM

## 2023-04-12 DIAGNOSIS — H409 Unspecified glaucoma: Secondary | ICD-10-CM | POA: Insufficient documentation

## 2023-04-12 DIAGNOSIS — R531 Weakness: Secondary | ICD-10-CM

## 2023-04-12 DIAGNOSIS — R600 Localized edema: Secondary | ICD-10-CM

## 2023-04-12 DIAGNOSIS — E118 Type 2 diabetes mellitus with unspecified complications: Secondary | ICD-10-CM

## 2023-04-12 DIAGNOSIS — R5383 Other fatigue: Secondary | ICD-10-CM

## 2023-04-12 DIAGNOSIS — E119 Type 2 diabetes mellitus without complications: Secondary | ICD-10-CM

## 2023-04-12 LAB — BASIC METABOLIC PANEL
Anion gap: 10 (ref 5–15)
BUN: 46 mg/dL — ABNORMAL HIGH (ref 8–23)
CO2: 15 mmol/L — ABNORMAL LOW (ref 22–32)
Calcium: 9.4 mg/dL (ref 8.9–10.3)
Chloride: 110 mmol/L (ref 98–111)
Creatinine, Ser: 3.17 mg/dL — ABNORMAL HIGH (ref 0.44–1.00)
GFR, Estimated: 15 mL/min — ABNORMAL LOW (ref >60)
Glucose, Bld: 111 mg/dL — ABNORMAL HIGH (ref 70–99)
Potassium: 4.8 mmol/L (ref 3.5–5.1)
Sodium: 135 mmol/L (ref 135–145)

## 2023-04-12 LAB — LIPID PANEL
Cholesterol: 111 mg/dL (ref 0–200)
HDL: 39 mg/dL — ABNORMAL LOW (ref >40)
LDL Cholesterol: 63 mg/dL (ref 0–99)
Total CHOL/HDL Ratio: 2.8 ratio
Triglycerides: 47 mg/dL (ref <150)
VLDL: 9 mg/dL (ref 0–40)

## 2023-04-12 LAB — HEMOGLOBIN A1C
Hgb A1c MFr Bld: 6.3 % — ABNORMAL HIGH (ref 4.8–5.6)
Mean Plasma Glucose: 134.11 mg/dL

## 2023-04-12 LAB — GLUCOSE, CAPILLARY: Glucose-Capillary: 105 mg/dL — ABNORMAL HIGH (ref 70–99)

## 2023-04-12 MED ORDER — FERROUS SULFATE 325 (65 FE) MG PO TABS
325.0000 mg | ORAL_TABLET | ORAL | Status: DC
Start: 2023-04-13 — End: 2023-04-13
  Administered 2023-04-13: 325 mg via ORAL
  Filled 2023-04-12: qty 1

## 2023-04-12 MED ORDER — CYCLOSPORINE 0.05 % OP EMUL
1.0000 [drp] | Freq: Two times a day (BID) | OPHTHALMIC | Status: DC
  Administered 2023-04-12 – 2023-04-13 (×2): 1 [drp] via OPHTHALMIC
  Filled 2023-04-12 (×3): qty 30

## 2023-04-12 MED ORDER — NETARSUDIL-LATANOPROST 0.02-0.005 % OP SOLN: 1.0000 [drp] | Freq: Every day | OPHTHALMIC | Status: DC

## 2023-04-12 MED ORDER — LACTATED RINGERS IV BOLUS
500.0000 mL | Freq: Once | INTRAVENOUS | Status: AC
  Administered 2023-04-12: 500 mL via INTRAVENOUS

## 2023-04-12 MED ORDER — INSULIN ASPART 100 UNIT/ML IJ SOLN: 0.0000 [IU] | Freq: Three times a day (TID) | INTRAMUSCULAR | Status: DC

## 2023-04-12 MED ORDER — PANTOPRAZOLE SODIUM 20 MG PO TBEC
20.0000 mg | DELAYED_RELEASE_TABLET | Freq: Two times a day (BID) | ORAL | Status: DC
  Administered 2023-04-12 – 2023-04-13 (×2): 20 mg via ORAL
  Filled 2023-04-12 (×2): qty 1

## 2023-04-12 MED ORDER — INSULIN ASPART 100 UNIT/ML IJ SOLN: 0.0000 [IU] | Freq: Every day | INTRAMUSCULAR | Status: DC

## 2023-04-12 MED ORDER — FAMOTIDINE 20 MG PO TABS
20.0000 mg | ORAL_TABLET | Freq: Every day | ORAL | Status: DC
  Administered 2023-04-13: 20 mg via ORAL
  Filled 2023-04-12: qty 1

## 2023-04-12 MED ORDER — BRIMONIDINE TARTRATE 0.2 % OP SOLN
1.0000 [drp] | Freq: Two times a day (BID) | OPHTHALMIC | Status: DC
  Administered 2023-04-12 – 2023-04-13 (×2): 1 [drp] via OPHTHALMIC
  Filled 2023-04-12: qty 5

## 2023-04-12 MED ORDER — LOPERAMIDE HCL 2 MG PO CAPS: 2.0000 mg | ORAL_CAPSULE | ORAL | Status: DC | PRN

## 2023-04-12 MED ORDER — ATORVASTATIN CALCIUM 40 MG PO TABS
40.0000 mg | ORAL_TABLET | Freq: Every day | ORAL | Status: DC
  Administered 2023-04-13: 40 mg via ORAL
  Filled 2023-04-12: qty 1

## 2023-04-12 MED ORDER — BRINZOLAMIDE 1 % OP SUSP
1.0000 [drp] | Freq: Two times a day (BID) | OPHTHALMIC | Status: DC
  Administered 2023-04-12 – 2023-04-13 (×2): 1 [drp] via OPHTHALMIC
  Filled 2023-04-12: qty 10

## 2023-04-12 MED ORDER — HEPARIN SODIUM (PORCINE) 5000 UNIT/ML IJ SOLN
5000.0000 [IU] | Freq: Three times a day (TID) | INTRAMUSCULAR | Status: DC
  Administered 2023-04-12 – 2023-04-13 (×3): 5000 [IU] via SUBCUTANEOUS
  Filled 2023-04-12 (×3): qty 1

## 2023-04-12 MED ORDER — LOPERAMIDE HCL 1 MG/7.5ML PO SOLN: ORAL | 0 refills | Status: DC

## 2023-04-12 MED ORDER — ASPIRIN 81 MG PO CHEW: 81.0000 mg | CHEWABLE_TABLET | Freq: Every day | ORAL | 3 refills | Status: DC

## 2023-04-12 NOTE — Assessment & Plan Note (Signed)
 Baseline creatinine 1 between 1.8  GFR has been trending down and has been in the upper 20s to low 30s in the last several checks.  She was referred to nephrology at last office visit and referral remains pending.  She reports drinking plenty of fluids but having profuse diarrhea for the last several days.  She feels like she cannot keep up.  She is urinating normally and at same frequency.  Denies blood in urine. She was found to be hypotensive with initial maps less than 65.  Blood pressures improved with drinking 30 ounces of fluids. P: Stat BMP checked in clinic.  Creatinine elevated from 1.95-3.17.  GFR from 25-15. Kidney injury most likely prerenal.  Would get UA and check bladder scan to complete workup. I have called patient and directed her to come in to be admitted to the hospital.  Bed should be available this evening.  On-call team notified

## 2023-04-12 NOTE — Progress Notes (Signed)
 Patient admitted directly from clinic. Please see Dr. Sloan Leiter'  note from early today for more details.  Full H & P to follow

## 2023-04-12 NOTE — Assessment & Plan Note (Signed)
 Blood pressure very low today.  She had not taking lisinopril since yesterday.  Blood pressure improved with 30 ounces of fluid.  Orthostatics were surprisingly negative when checked following fluids. P: Lisinopril, Jardiance discontinued from medication list She will be further evaluated in hospital.

## 2023-04-12 NOTE — Progress Notes (Signed)
 Subjective:  CC: numbness, diarrhea  HPI:  [name] is a 68 y.o. female with a past medical history of diabetes, CKD stage 3b, IDA, and HLD who presents today for numbness, fatigue and diarrhea for the last 2-4 days.  She called in as she did not feel great since Monday.  She noted lack of energy.  Triage note also mentions that she was having weakness in her right hand with numbness.  In talking with patient it sounds like she has had fatigue for the last 4 days.  She walks around the track with her rollator and has had difficulty completing full laps due to fatigue.   Yesterday she woke up and felt like her right arm was numb and tingling like she had been sleeping on it.  Symptoms eventually resolved, but she is not able to quantify length of time that took. Her guess is within a few hours. She tried to put her blood pressure cuff on at that time and had difficulty lifting her arm to do so due to weakness.    In the last 2 days she started having diarrhea after eating any food.  She has been drinking plenty of fluids.  She continues to have normal urinary output in the last few days.  She denies feeling dizzy with standing.  Lost about 10 pounds since her visit in February which she reports was intentional.  She was last seen in clinic 2/25. Her GFR was <30 at that visit.  She was referred to nephrology at that time but has not seen them yet. She was not aware that her kidney function was low.  Please see problem based assessment and plan for additional details.  Past Medical History:  Diagnosis Date   Arthritis    Chronic normocytic anemia    CKD (chronic kidney disease) stage 3, GFR 30-59 ml/min (HCC) 02/02/2013   Diabetes mellitus type II, controlled (HCC) 06/17/2009   Diabetic ulcer of both feet (HCC) 01/07/2010   Qualifier: Diagnosis of  By: [name] MD, Amanjot     Essential hypertension 06/17/2009   GERD (gastroesophageal reflux disease)    Glaucoma    H/O hiatal  hernia    Hardware complicating wound infection (HCC) 12/28/2020   Hyperlipidemia    MRSA (methicillin resistant Staphylococcus aureus)    Obstructive sleep apnea 05/26/2010   Polymicrobial bacterial infection 12/28/2020    MEDICATIONS:  Iron tablet Ozempic 0.5 mg weekly- not taking Jardiance 10 mg Pantoprazle 20 mg BID Famotidine 20 mg Lisinopril 20 mg Atorvastatin 40 mg Metformin 500 mg daily  Family History  Problem Relation Age of Onset   Diabetes Mother    Hypertension Mother    Dementia Mother    Heart disease Father    Stroke Father    Diabetes Father    Hypertension Father    Gout Father    Deep vein thrombosis Father    Breast cancer Cousin    Breast cancer Cousin     Past Surgical History:  Procedure Laterality Date   ACHILLES TENDON LENGTHENING Left 11/06/2014   Procedure: LEFT ACHILLES TENDON LENGTHENING, ;  Surgeon: Toni Arthurs, MD;  Location: [facility] OR;  Service: Orthopedics;  Laterality: Left;   APPENDECTOMY  1975   FOOT ARTHRODESIS Left 11/06/2014   Procedure: TALONAVICULAR CANCELLOUS CUBOID ARTHRODESIS WITH CORRECTIVE OSTEOTOMY ;  Surgeon: Toni Arthurs, MD;  Location: [facility] OR;  Service: Orthopedics;  Laterality: Left;   FOOT ARTHRODESIS Right 05/14/2020   Procedure: Right heelcord lengthening,  subtalar and talonavicular arthrodesis, talus exostectomy, talus corrective osteotomy;  Surgeon: Toni Arthurs, MD;  Location: [facility] OR;  Service: Orthopedics;  Laterality: Right;   HARDWARE REMOVAL Left 02/19/2015   Procedure: REMOVAL OF LEFT FOOT EXTERNAL FIXATOR, CASTING OF LEFT ANKLE ;  Surgeon: Toni Arthurs, MD;  Location: [facility];  Service: Orthopedics;  Laterality: Left;   HARDWARE REMOVAL Right 12/17/2020   Procedure: Removal of deep implant right foot;  Surgeon: Toni Arthurs, MD;  Location: [facility];  Service: Orthopedics;  Laterality: Right;   I & D EXTREMITY Left 2011   "ulcer on my foot got infected" (01/30/2013)   I & D EXTREMITY Right 05/06/2021    Procedure: Irrigation and excisional debridement of right ankle abcess;  Surgeon: Toni Arthurs, MD;  Location: [facility];  Service: Orthopedics;  Laterality: Right;    MULTIPLE TOOTH EXTRACTIONS Bilateral    OOPHORECTOMY Bilateral 1975-1976     Social History   Socioeconomic History   Marital status: Single    Spouse name: Not on file   Number of children: Not on file   Years of education: Not on file   Highest education level: Not on file  Occupational History   Occupation: Disabled  Tobacco Use   Smoking status: Former    Current packs/day: 0.00    Types: Cigarettes    Start date: 12/22/1972    Quit date: 12/22/1972    Years since quitting: 50.3   Smokeless tobacco: Never  Vaping Use   Vaping status: Never Used  Substance and Sexual Activity   Alcohol use: No   Drug use: No   Sexual activity: Not Currently  Other Topics Concern   Not on file  Social History Narrative   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   Smoked when she was a teenager   no alcohol   no Drugs      Current Social History 10/26/2018        Patient lives on the main level of a 3 floor boarding house with 3 other tenants. There are 3 steps with handrails up to the entrance the patient uses.       Patient's method of transportation is personal car.      The highest level of education was Bachelor's Degree.      The patient currently disabled.      Identified important Relationships are "My cousins, [name] and Lianne Moris, and my dear friend Mliss Sax."      Pets : None       Interests / Fun: "I love to read, write, photography: go around [place] taking photos of the [address], Pop-Art, Art Museums, cooking, hanging out with friends, several ministries with my Church, especially working with the youth."      Current Stressors: "Worrying about someone to care for me if anything happens to me. Being closed in (2/2 Covid)"        Religious / Personal Beliefs: Protestant ([employer])       L. Ducatte, BSN, RN-BC          Social Drivers of Health   Financial Resource Strain: Low Risk  (10/26/2022)   Overall Financial Resource Strain (CARDIA)    Difficulty of Paying Living Expenses: Not very hard  Food Insecurity: No Food Insecurity (12/07/2022)   Hunger Vital Sign    Worried About Running Out of Food in the Last Year: Never true  Ran Out of Food in the Last Year: Never true  Transportation Needs: No Transportation Needs (12/07/2022)   PRAPARE - Administrator, Civil Service (Medical): No    Lack of Transportation (Non-Medical): No  Physical Activity: Inactive (10/26/2022)   Exercise Vital Sign    Days of Exercise per Week: 0 days    Minutes of Exercise per Session: 0 min  Stress: No Stress Concern Present (10/26/2022)   [employer] of Occupational Health - Occupational Stress Questionnaire    Feeling of Stress : Only a little  Social Connections: Moderately Integrated (10/26/2022)   Social Connection and Isolation Panel [NHANES]    Frequency of Communication with Friends and Family: More than three times a week    Frequency of Social Gatherings with Friends and Family: Three times a week    Attends Religious Services: More than 4 times per year    Active Member of Clubs or Organizations: Yes    Attends Banker Meetings: More than 4 times per year    Marital Status: Never married  Intimate Partner Violence: Not At Risk (12/07/2022)   Humiliation, Afraid, Rape, and Kick questionnaire    Fear of Current or Ex-Partner: No    Emotionally Abused: No    Physically Abused: No    Sexually Abused: No    Review of Systems: ROS negative except for what is noted on the assessment and plan.  Objective:   Vitals:   04/12/23 1006 04/12/23 1033 04/12/23 1049  BP: (!) 98/47 (!) 91/47 (!) 103/52  Pulse: 73 67 68  Temp: 98.1 F (36.7 C)    TempSrc: Oral    SpO2:  100%    Weight: 202 lb 6.4 oz (91.8 kg)    Height: 5\' 8"  (1.727 m)     Orthostatic VS for the past 24 hrs (Last 3 readings):  BP- Lying Pulse- Lying BP- Sitting Pulse- Sitting BP- Standing at 0 minutes Pulse- Standing at 0 minutes BP- Standing at 3 minutes Pulse- Standing at 3 minutes  04/12/23 1115 131/51 67 118/55 64 122/59 72 119/61 72    Physical Exam: Constitutional: appears fatigued Cardiovascular: regular rate and rhythm, no m/r/g Pulmonary/Chest: normal work of breathing on room air, lungs clear to auscultation bilaterally Abdominal: soft, non-tender, non-distended MSK: normal bulk and tone Neurological:  Mental Status: Patient is awake, alert, oriented x3 No signs of aphasia or neglect Cranial Nerves: II: Pupils equal, round, and reactive to light.   III,IV, VI: EOMI without ptosis or diploplia.  V: Facial sensation is symmetric to light touch and temperature. VII: Facial movement is symmetric.  VIII: Hearing is intact to voice X: Uvula elevates symmetrically XI: Shoulder shrug is symmetric. XII: Tongue is midline without atrophy or fasciculations.  Motor: good effort thorughout, at least 5/5 bilateral UE, 5/5 bilateral LE Sensory: Sensation is grossly intact with bilateral UE & LE Cerebellar: Finger-Nose and Heel-Shin intact bilaterally Skin: decreased skin turgor  Assessment & Plan:  TIA (transient ischemic attack) Patient with right sided numbness tingling and weakness in upper extremity.  Numbness and tingling was present to radial, median and ulnar nerve distribution in her hand up to around her elbow.  She is not clear exactly duration of symptoms, but thinks they lasted more than 30 minutes less than a few hours. Today symptoms have resolved and she feels back to normal.  ABCD2 score for TIA elevated at 5-6 points, which places her at moderate to high risk for stroke.  Initially considered  doing expedited outpatient workup.  However with risk score, hypotension, and  AKI on CKD she will be best served by hospital admission. P: She is being direct admitted to med telemetry, I called and informed patient that she will be receiving a call this evening for bed placement and has 3 hours to make it to the hospital. Would recommend getting MRI brain, MRA brain/neck, echo with bubble study.  If no bleed on MRI brain then would start DAPT therapy using aspirin loading dose followed by 81 mg with plavix 75 mg and consult neurology.  Lipid panel added to labs, she is at goal for secondary prevention with LDL of 63. A1c was well controlled when last checked in December, could consider rechecking that tomorrow in hospital  AKI (acute kidney injury) (HCC) Baseline creatinine 1 between 1.8  GFR has been trending down and has been in the upper 20s to low 30s in the last several checks.  She was referred to nephrology at last office visit and referral remains pending.  She reports drinking plenty of fluids but having profuse diarrhea for the last several days.  She feels like she cannot keep up.  She is urinating normally and at same frequency.  Denies blood in urine. She was found to be hypotensive with initial maps less than 65.  Blood pressures improved with drinking 30 ounces of fluids. P: Stat BMP checked in clinic.  Creatinine elevated from 1.95-3.17.  GFR from 25-15. Kidney injury most likely prerenal.  Would get UA and check bladder scan to complete workup. I have called patient and directed her to come in to be admitted to the hospital.  Bed should be available this evening.  On-call team notified  Diabetes mellitus type 2, controlled, with complications (HCC) Last hemoglobin A1c 5.3 well-controlled in December.  She can no longer safely take metformin with renal function.  She is also not able to take GLP-1 in the setting of gastroparesis leading to bowel obstruction and hospital admission. P: Stop metformin and discontinued Ozempic from medication list.  Patient had  not been taking Ozempic since admitted to the hospital with bowel obstruction.  Would recheck A1c while hospitalized.  Hypertension associated with stage 3 chronic kidney disease due to type 2 diabetes mellitus (HCC) Blood pressure very low today.  She had not taking lisinopril since yesterday.  Blood pressure improved with 30 ounces of fluid.  Orthostatics were surprisingly negative when checked following fluids. P: Lisinopril, Jardiance discontinued from medication list She will be further evaluated in hospital.  Diarrhea She had diffuse diarrhea for the last 2 days.  She was concerned that it is related to something that she ate.  She denies vomiting and nausea.  She has been drinking plenty of fluids but feels like anytime she eats solid foods she immediately has to go to the bathroom. P: I think this is likely viral gastroenteritis and should resolve soon.  She has no recent history of antibiotic exposure that would be concerning for C. difficile.    Patient discussed with Dr. Precious Bard Camilah Spillman, D.O. [facility]  PGY-3 Pager: [phone]  Phone: [phone] Date 04/12/2023  Time 5:36 PM

## 2023-04-12 NOTE — Hospital Course (Signed)
 This is a 68 year old female with a past medical history of CKD coming in for concern for 2 to 3 days of diarrhea and fatigue.  Patient also concern for left arm numbness and tingling.  At be a TIA is improved.  Seen in the clinic today.  Patient had maps below 65.  Had some fatigue.  BMP showed elevated creatinine with concern for increasing kidney function.  Will likely need some fluids.  Will also potentially need workup for TIA. Her risk for stroke in the next 3 days is elevated based off symptom score calculated in the clinic, unsure what score. No recent antibiotic use, diarrhea is improved.  Admitted for likely fluid resuscitation.  Likely will need orthostatics as well.  Medications: Tylenol [phone] mg every 6 hours Vitamin C 1000 mg daily Lipitor 40 mg daily Biotin 5000 mcg daily Cyclosporine eyedrops twice daily Pepcid 20 mg daily Iron 3 and 25 mg every other day Lidocaine patch Imodium 4 mg once, followed by 2 mg after each loose stool Netarsudil-latanoprost 1 drop nightly Protonix 20 mg twice daily MiraLAX Refresh eyedrops Senna Simbrinza eyedrops  Labs: 04/12/2023 Lipid panel total cholesterol 111, triglycerides 47, LDL 63 BMP: Sodium 135, potassium 4.8, bicarb 15, creatinine 3.17  03/06/2023 Ferritin 126, iron 39, TIBC 205

## 2023-04-12 NOTE — Telephone Encounter (Signed)
 Copied from CRM [id number]. Topic: Clinical - Red Word Triage >> Apr 12, 2023  7:59 AM Hamdi H wrote: [facility] that prompted transfer to Nurse Triage: Numbness in right hand, can't list a lot of weight with it since last night.   Chief Complaint: Numbness  Symptoms: Fatigue, Diarrhea, Headaches Frequency: Since Yesterday  Pertinent Negatives: Patient denies visual changes , slurred speech, or cardiac symptoms.   Disposition: [] ED /[] Urgent Care (no appt availability in office) / [x] Appointment(In office/virtual)/ []  [facility]/ [] Home Care/ [] Refused Recommended Disposition /[] [facility]/ []  Follow-up with PCP Additional Notes: ME is being triaged for numbness and weakness in the right hand and arm. The patient states she exercises on the daily and felt great on Monday. Noticed yesterday she felt a lack of energy and unmotivated. This morning the patient noticed that she had began experiencing weakness in her right hand accompanied by numbness in her right arm. The patient denies visual changes, slurred speech, or changes in her left side or right leg. The patient confirms she often utilizes weights when she exercises and could not even pick up 2 lbs. With her right hand. Advised the patient to seek care as soon as possible for prompt evaluation and treatment of symptoms.   Reason for Disposition  [1] Weakness of the face, arm / hand, or leg / foot on one side of the body AND [2] gradual onset (e.g., days to weeks) AND [3] present now  Answer Assessment - Initial Assessment Questions 1. SYMPTOM: "What is the main symptom you are concerned about?" (e.g., weakness, numbness)     Weakness and numbness  2. ONSET: "When did this start?" (minutes, hours, days; while sleeping)     Yesterday  3. LAST NORMAL: "When was the last time you (the patient) were normal (no symptoms)?"     Yesterday  4. PATTERN "Does this come and go, or has it been constant since it started?"  "Is  it present now?"     Constant  5. CARDIAC SYMPTOMS: "Have you had any of the following symptoms: chest pain, difficulty breathing, palpitations?"     No  6. NEUROLOGIC SYMPTOMS: "Have you had any of the following symptoms: headache, dizziness, vision loss, double vision, changes in speech, unsteady on your feet?"     Headache  7. OTHER SYMPTOMS: "Do you have any other symptoms?"     None  8. PREGNANCY: "Is there any chance you are pregnant?" "When was your last menstrual period?"     None  Protocols used: Neurologic Deficit-A-AH

## 2023-04-12 NOTE — Assessment & Plan Note (Signed)
 Last hemoglobin A1c 5.3 well-controlled in December.  She can no longer safely take metformin with renal function.  She is also not able to take GLP-1 in the setting of gastroparesis leading to bowel obstruction and hospital admission. P: Stop metformin and discontinued Ozempic from medication list.  Patient had not been taking Ozempic since admitted to the hospital with bowel obstruction.  Would recheck A1c while hospitalized.

## 2023-04-12 NOTE — Assessment & Plan Note (Addendum)
 Patient with right sided numbness tingling and weakness in upper extremity.  Numbness and tingling was present to radial, median and ulnar nerve distribution in her hand up to around her elbow.  She is not clear exactly duration of symptoms, but thinks they lasted more than 30 minutes less than a few hours. Today symptoms have resolved and she feels back to normal.  ABCD2 score for TIA elevated at 5-6 points, which places her at moderate to high risk for stroke.  Initially considered doing expedited outpatient workup.  However with risk score, hypotension, and AKI on CKD she will be best served by hospital admission. P: She is being direct admitted to med telemetry, I called and informed patient that she will be receiving a call this evening for bed placement and has 3 hours to make it to the hospital. Would recommend getting MRI brain, MRA brain/neck, echo with bubble study.  If no bleed on MRI brain then would start DAPT therapy using aspirin loading dose followed by 81 mg with plavix 75 mg and consult neurology.  Lipid panel added to labs, she is at goal for secondary prevention with LDL of 63. A1c was well controlled when last checked in December, could consider rechecking that tomorrow in hospital

## 2023-04-12 NOTE — Assessment & Plan Note (Signed)
 She had diffuse diarrhea for the last 2 days.  She was concerned that it is related to something that she ate.  She denies vomiting and nausea.  She has been drinking plenty of fluids but feels like anytime she eats solid foods she immediately has to go to the bathroom. P: I think this is likely viral gastroenteritis and should resolve soon.  She has no recent history of antibiotic exposure that would be concerning for C. difficile.

## 2023-04-12 NOTE — Hospital Course (Signed)
 TIA [name] is a 68 y.o. F with pertinent PMH of T2DM, HTN, HLD, CKD stage 3b, who presented with profound fatigue and dizziness with some weakness of R hand who was  admitted for CVA rule-out. Follow up MRI/MRA were all negative. This TIA was thought to be secondary to hypotension secondary to dehydration and was given some fluids.  Her hand weakness has resolved and she did not show any signs of focal neurological deficits on reevaluation.  She got an echo to rule out cardiac etiologies but that was unremarkable as well.  [name] was discharged with instructions to hold her lisinopril until she follows up with her PCP.   Diarrhea Reported loose stools 4-5 times a day since Monday but resolving per patient. No new medications. No further episodes on presentation and she denied  blood in stool. Afebrile. Electrolytes stable. Suspected viral gastroenteritis. We encouraged po fluid intake and monitored her while she was hospitalized.    AKI on CKD stage 3b She had acute on chronic kidney disease stage IIIb on arrival.  Was thought this was  likely due to diarrhea and poor p.o. intake.  On urinalysis, her urine color was colorless and had a very low specific gravity of 1.002.  Patient was given 500 L bolus of fluids yes.  Resumed her Jardiance on discharge had held her metformin until she follows up with her primary care physician on Thursday 04/20/2023.

## 2023-04-12 NOTE — Patient Instructions (Signed)
 Thank you, Ms.[name] for allowing [place] to provide your care today.   Low blood pressure Your blood pressure was very low today! I am worried that it is because of your diarrhea. Do not take your lisinopril, jardiance, or metformin medications.  I am checking your kidney labs today. I am also checking your thyroid labs.  Please drink 2 L of water for the next few days and take loperamide to help with diarrhea. Come back next week to make sure you are doing better!  I have ordered the following labs for you:  Lab Orders         BMP w Anion Gap (STAT/Sunquest-performed on-site)         TSH      Referrals ordered today:   Referral Orders  No referral(s) requested today     I have ordered the following medication/changed the following medications:   Stop the following medications: Medications Discontinued During This Encounter  Medication Reason   OZEMPIC, 0.25 OR 0.5 MG/DOSE, 2 MG/3ML SOPN    metFORMIN (GLUCOPHAGE-XR) 500 MG 24 hr tablet Discontinued by provider   empagliflozin (JARDIANCE) 10 MG TABS tablet    lisinopril (ZESTRIL) 20 MG tablet      Start the following medications: Meds ordered this encounter  Medications   loperamide HCl (IMODIUM A-D) 1 MG/7.5ML solution    Sig: Take 4 mg once, then 2 mg after each loose stool. Maximum 8 mg per day.    Dispense:  120 mL    Refill:  0     Follow up:  1 week    We look forward to seeing you next time. Please call our clinic at [phone] if you have any questions or concerns. The best time to call is Monday-Friday from 9am-4pm, but there is someone available 24/7. If after hours or the weekend, call the main hospital number and ask for the Internal Medicine Resident On-Call. If you need medication refills, please notify your pharmacy one week in advance and they will send [place] a request.   Thank you for trusting me with your care. Wishing you the best!   Rudene Christians, DO [facility]

## 2023-04-12 NOTE — H&P (Cosign Needed Addendum)
 Date: 04/12/2023               Patient Name:  [name] MRN: [id number]  DOB: 11/03/1955 Age / Sex: 68 y.o., female   PCP: Modena Slater, DO         Medical Service: Internal Medicine Teaching Service         Attending Physician: Dr. Ginnie Smart, MD    First Contact: Dr. Kathleen Lime Pager: [phone]  Second Contact: Dr. Marrianne Mood Pager: [phone]       After Hours (After 5p/  First Contact Pager: [phone]  weekends / holidays): Second Contact Pager: [phone]   Chief Complaint: fatigue, dizziness  History of Present Illness: [name] is a 68 y.o. F with pertinent PMH of T2DM, HTN, HLD, CKD stage 3b, who presented with profound fatigue and dizziness with some weakness of R hand who is admitted for CVA rule-out.  Marzella states that yesterday around 9-930P she was at bible study, writing with her right hand, when she suddenly felt profoundly sleepy and proceeded to fall to her right side. When she woke up, she noted no strength of her R hand. She tried to grab a bottle of dish detergent with her R hand and dropped it. She did not have weakness anywhere else.  She also has had diarrhea, 5-6 episodes daily, over the last 3 days. She has not had recurrence of these episodes since this morning. She states she was drinking a lot of water to try to keep up with her losses. Denies anyone around her with GI symptoms.   She has been working out at [employer] more lately including walking around the track and lifting weights. She normally uses 4-5 pound weights for bicep curls but noted since her weakness came on, she has not been able to use even 2 pound weights.   She was feeling poorly in general yesterday but otherwise has not had abdominal pain or vomiting, no known sick contacts. She had some unsweet tea, eggs, and seafood yesterday but nothing exceptionally out of the ordinary.   She has never experienced an episode like this before.   Past Medical History: Past  Medical History:  Diagnosis Date   Arthritis    Chronic normocytic anemia    CKD (chronic kidney disease) stage 3, GFR 30-59 ml/min (HCC) 02/02/2013   Diabetes mellitus type II, controlled (HCC) 06/17/2009   Diabetic ulcer of both feet (HCC) 01/07/2010   Qualifier: Diagnosis of  By: [name] MD, Amanjot     Essential hypertension 06/17/2009   GERD (gastroesophageal reflux disease)    Glaucoma    H/O hiatal hernia    Hardware complicating wound infection (HCC) 12/28/2020   Hyperlipidemia    MRSA (methicillin resistant Staphylococcus aureus)    Obstructive sleep apnea 05/26/2010   Polymicrobial bacterial infection 12/28/2020   Past Surgical History: Past Surgical History:  Procedure Laterality Date   ACHILLES TENDON LENGTHENING Left 11/06/2014   Procedure: LEFT ACHILLES TENDON LENGTHENING, ;  Surgeon: Toni Arthurs, MD;  Location: [facility] OR;  Service: Orthopedics;  Laterality: Left;   APPENDECTOMY  1975   FOOT ARTHRODESIS Left 11/06/2014   Procedure: TALONAVICULAR CANCELLOUS CUBOID ARTHRODESIS WITH CORRECTIVE OSTEOTOMY ;  Surgeon: Toni Arthurs, MD;  Location: [facility] OR;  Service: Orthopedics;  Laterality: Left;   FOOT ARTHRODESIS Right 05/14/2020   Procedure: Right heelcord lengthening, subtalar and talonavicular arthrodesis, talus exostectomy, talus corrective osteotomy;  Surgeon: Toni Arthurs, MD;  Location: [facility] OR;  Service: Orthopedics;  Laterality: Right;   HARDWARE REMOVAL Left 02/19/2015   Procedure: REMOVAL OF LEFT FOOT EXTERNAL FIXATOR, CASTING OF LEFT ANKLE ;  Surgeon: Toni Arthurs, MD;  Location: [facility];  Service: Orthopedics;  Laterality: Left;   HARDWARE REMOVAL Right 12/17/2020   Procedure: Removal of deep implant right foot;  Surgeon: Toni Arthurs, MD;  Location: [facility] OR;  Service: Orthopedics;  Laterality: Right;   I & D EXTREMITY Left 2011   "ulcer on my foot got infected" (01/30/2013)   I & D EXTREMITY Right 05/06/2021   Procedure: Irrigation and excisional debridement  of right ankle abcess;  Surgeon: Toni Arthurs, MD;  Location: [facility];  Service: Orthopedics;  Laterality: Right;    MULTIPLE TOOTH EXTRACTIONS Bilateral    OOPHORECTOMY Bilateral 1975-1976   Meds:  -Lipitor 40 daily -ferrous sulfate every other day. -protonix 20 mg BID -pepcid 20 mg daily -lisinopril 20 mg daily  -jardiance 10 mg daily -metformin 500 mg daily   Current Meds  Medication Sig   Accu-Chek Softclix Lancets lancets USE ONE LANCET TO TEST THREE TIMES A DAY   acetaminophen (TYLENOL) 500 MG tablet Take 500-1,000 mg by mouth every 6 (six) hours as needed for moderate pain or headache.   Ascorbic Acid (VITAMIN C) 1000 MG tablet Take 1,000 mg by mouth daily. Rose hips   atorvastatin (LIPITOR) 40 MG tablet TAKE 1 TABLET BY MOUTH DAILY   Biotin 5000 MCG CAPS Take 5,000 mcg by mouth daily.   Blood Glucose Monitoring Suppl (ACCU-CHEK AVIVA PLUS) w/Device KIT Use to monitor blood sugars   Blood Pressure Monitoring (ADULT BLOOD PRESSURE CUFF LG) KIT Use to monitor your BP daily and bring log during each visit   cycloSPORINE (RESTASIS) 0.05 % ophthalmic emulsion Place 1 drop into both eyes 2 (two) times daily. 8 am and 1700   Emollient (BAG BALM) OINT Apply to feet twice daily (Patient taking differently: Apply 1 Application topically 2 (two) times daily. Apply to feet)   ferrous sulfate 325 (65 FE) MG tablet Take 1 tablet every other day with breakfast   glucose blood (ACCU-CHEK GUIDE TEST) test strip Use as instructed   glucose blood (ACCU-CHEK GUIDE) test strip USE ONE STRIP TO TEST THREE TIMES A DAY   lidocaine (LIDODERM) 5 % Place 1 patch onto the skin daily. Remove & Discard patch within 12 hours or as directed by MD (Patient taking differently: Place 1 patch onto the skin daily as needed (for pain).)   Netarsudil-Latanoprost (ROCKLATAN) 0.02-0.005 % SOLN Place 1 drop into both eyes every evening.   pantoprazole (PROTONIX) 20 MG tablet TAKE 1 TABLET BY MOUTH  2 TIMES A DAY   polyethylene glycol powder (GLYCOLAX/MIRALAX) 17 GM/SCOOP powder Mix as directed and take 17 g by mouth 2 (two) times daily. (Patient taking differently: Take 17 g by mouth daily as needed for moderate constipation.)   Polyvinyl Alcohol-Povidone (REFRESH OP) Place 1 drop into both eyes daily as needed (dry eyes).   senna-docusate (SENOKOT-S) 8.6-50 MG tablet Take 1 tablet by mouth at bedtime.   SIMBRINZA 1-0.2 % SUSP Place into both eyes in the morning and at bedtime.   Allergies: Allergies as of 04/12/2023 - Reviewed 04/12/2023  Allergen Reaction Noted   Codeine Itching and Swelling 06/17/2009   Pravastatin Other (See Comments) 03/09/2012   Latex Rash 06/23/2016   Family History:  Family History  Problem Relation Age of Onset   Diabetes Mother    Hypertension  Mother    Dementia Mother    Heart disease Father    Stroke Father    Diabetes Father    Hypertension Father    Gout Father    Deep vein thrombosis Father    Breast cancer Cousin    Breast cancer Cousin    Social History: [facility] patient. Lives in [place], has two housemates. Has a walker for ambulatory assistance. Independent in ADLs and IADLs. Denies alcohol, tobacco, or other drug use.  Review of Systems: A complete ROS was negative except as per HPI.   Physical Exam: Vitals: BP: 108/62, HR 72, temp 97.7, RR 16, SpO2 100% on room air Constitutional: Alert sitting up comfortably, in no acute distress HENT: normocephalic atraumatic, mucous membrane dry  Eyes: PERRL, EOMI Neck: supple Cardiovascular: Regular rate and rhythm Pulmonary/Chest: Normal work of breathing on room air, lungs clear bilaterally Abdominal: Bowel sounds present, soft, nontender, nondistended MSK: No LE edema Neurological: alert & oriented x 3, CN II-XII intact, sensation intact in all extremities, 5 out of 5 strength in bilateral upper and lower extremities Skin: warm and dry  EKG: NSR with RBBB, stable compared to prior.  Assessment  & Plan by Problem: Principal Problem:   TIA (transient ischemic attack)  TIA LKN 03/25 2100-2130. At this time, symptoms have improved if not almost resolved of her R UE. Suspect hypoperfusion secondary to acute GI illness contributed to her symptoms.  Plan: -Admit to telemetry -May consult neurology pending results of work-up with: -Repeat HbA1c -MRI brain, CTA head/neck -Echocardiogram w/ bubble study -LDL is at goal, 63; continue atorvastatin 40 mg daily -PT/OT consult  Diarrhea Reported loose stools 4-5 times a day since Monday but resolving per patient. No new medications. No further episodes since this morning. Denies blood in stool. Afebrile. Electrolytes stable. Suspect viral gastroenteritis.  -Encourage po fluid intake, monitor for now   AKI on CKD stage 3b Suspect pre-renal etiology in setting of diarrheal illness, though BUN/Cr ratio is borderline at 14.51. Jardiance stopped today at [facility] visit. Plan: -Will give LR 500 cc bolus; encourage PO intake -Will check UA, renal ultrasound -Can resume home jardiance 10 mg daily -Trend BMP daily -Avoid nephrotoxic agents  Type 2 diabetes mellitus Metformin stopped today at [facility] visit. I do not suspect that this chronic medication is contributing to acute diarrheal illness. Plan: -CBG, SSI TID with meals; goal CBG 140-180 -Consider resume metformin at discharge  HTN Mildly hypotensive on arrival. Home regimen includes lisinopril 20 mg daily though this was stopped at [facility] visit today. Plan: -Consider resume lisinopril at discharge as BP and renal function allow -Will check orthostatic VS  GERD Plan: -Continue home pepcid 20 mg daily, protonix 20 mg BID -Consider reducing dose as indicated as outpatient  History of iron deficiency anemia No signs of active bleeding. Iron studies overall unremarkable when last checked 02/2023; Hgb appears to be in the 9's on prior studies. Plan: -Continue home ferrous sulfate 325 mg every  other day -CBC with morning labs  Glaucoma Plan: -Continue formulary equivalent of home eye drops: cyclosporine 1 drop each eye BID, netarsudil-latanoprost 1 drop each eye daily, simbrinza 1 drop each eye BID  Diet: Heart Healthy IVF: LR,Bolus VTE: Heparin Code: Full Surrogate Decision Maker: [name] (relative)  Dispo: Admit patient to Observation with expected length of stay less than 2 midnights.  SignedRana Snare, DO 04/12/2023, 9:51 PM  After 5pm on weekdays and 1pm on weekends: On Call pager: [phone]

## 2023-04-13 ENCOUNTER — Observation Stay (HOSPITAL_BASED_OUTPATIENT_CLINIC_OR_DEPARTMENT_OTHER)

## 2023-04-13 DIAGNOSIS — G459 Transient cerebral ischemic attack, unspecified: Principal | ICD-10-CM

## 2023-04-13 DIAGNOSIS — R197 Diarrhea, unspecified: Secondary | ICD-10-CM | POA: Diagnosis not present

## 2023-04-13 DIAGNOSIS — H409 Unspecified glaucoma: Secondary | ICD-10-CM | POA: Diagnosis not present

## 2023-04-13 DIAGNOSIS — K219 Gastro-esophageal reflux disease without esophagitis: Secondary | ICD-10-CM | POA: Diagnosis not present

## 2023-04-13 DIAGNOSIS — I159 Secondary hypertension, unspecified: Secondary | ICD-10-CM | POA: Diagnosis not present

## 2023-04-13 DIAGNOSIS — N1832 Chronic kidney disease, stage 3b: Secondary | ICD-10-CM | POA: Diagnosis not present

## 2023-04-13 DIAGNOSIS — Z862 Personal history of diseases of the blood and blood-forming organs and certain disorders involving the immune mechanism: Secondary | ICD-10-CM | POA: Diagnosis not present

## 2023-04-13 DIAGNOSIS — R2689 Other abnormalities of gait and mobility: Secondary | ICD-10-CM | POA: Diagnosis not present

## 2023-04-13 DIAGNOSIS — E1149 Type 2 diabetes mellitus with other diabetic neurological complication: Secondary | ICD-10-CM

## 2023-04-13 DIAGNOSIS — N179 Acute kidney failure, unspecified: Secondary | ICD-10-CM | POA: Diagnosis not present

## 2023-04-13 DIAGNOSIS — I129 Hypertensive chronic kidney disease with stage 1 through stage 4 chronic kidney disease, or unspecified chronic kidney disease: Secondary | ICD-10-CM | POA: Diagnosis not present

## 2023-04-13 DIAGNOSIS — Z7984 Long term (current) use of oral hypoglycemic drugs: Secondary | ICD-10-CM | POA: Diagnosis not present

## 2023-04-13 DIAGNOSIS — E785 Hyperlipidemia, unspecified: Secondary | ICD-10-CM | POA: Diagnosis not present

## 2023-04-13 DIAGNOSIS — I672 Cerebral atherosclerosis: Secondary | ICD-10-CM | POA: Diagnosis not present

## 2023-04-13 DIAGNOSIS — E1122 Type 2 diabetes mellitus with diabetic chronic kidney disease: Secondary | ICD-10-CM | POA: Diagnosis not present

## 2023-04-13 DIAGNOSIS — Z91040 Latex allergy status: Secondary | ICD-10-CM | POA: Diagnosis not present

## 2023-04-13 DIAGNOSIS — Z79899 Other long term (current) drug therapy: Secondary | ICD-10-CM | POA: Diagnosis not present

## 2023-04-13 LAB — COMPREHENSIVE METABOLIC PANEL WITH GFR
ALT: 6 U/L (ref 0–44)
AST: 13 U/L — ABNORMAL LOW (ref 15–41)
Albumin: 3 g/dL — ABNORMAL LOW (ref 3.5–5.0)
Alkaline Phosphatase: 63 U/L (ref 38–126)
Anion gap: 5 (ref 5–15)
BUN: 42 mg/dL — ABNORMAL HIGH (ref 8–23)
CO2: 18 mmol/L — ABNORMAL LOW (ref 22–32)
Calcium: 9.1 mg/dL (ref 8.9–10.3)
Chloride: 115 mmol/L — ABNORMAL HIGH (ref 98–111)
Creatinine, Ser: 2.98 mg/dL — ABNORMAL HIGH (ref 0.44–1.00)
GFR, Estimated: 17 mL/min — ABNORMAL LOW (ref >60)
Glucose, Bld: 168 mg/dL — ABNORMAL HIGH (ref 70–99)
Potassium: 4.4 mmol/L (ref 3.5–5.1)
Sodium: 138 mmol/L (ref 135–145)
Total Bilirubin: 0.2 mg/dL (ref 0.0–1.2)
Total Protein: 6.1 g/dL — ABNORMAL LOW (ref 6.5–8.1)

## 2023-04-13 LAB — ECHOCARDIOGRAM COMPLETE BUBBLE STUDY
Area-P 1/2: 3.42 cm2
Calc EF: 65.4 %
S' Lateral: 2.4 cm
Single Plane A2C EF: 60.5 %
Single Plane A4C EF: 67.8 %

## 2023-04-13 LAB — URINALYSIS, ROUTINE W REFLEX MICROSCOPIC
Bacteria, UA: NONE SEEN
Bilirubin Urine: NEGATIVE
Glucose, UA: >=500 mg/dL — AB
Hgb urine dipstick: NEGATIVE
Ketones, ur: NEGATIVE mg/dL
Leukocytes,Ua: NEGATIVE
Nitrite: NEGATIVE
Protein, ur: NEGATIVE mg/dL
Specific Gravity, Urine: 1.002 — ABNORMAL LOW (ref 1.005–1.030)
pH: 5 (ref 5.0–8.0)

## 2023-04-13 LAB — CBC
HCT: 27.3 % — ABNORMAL LOW (ref 36.0–46.0)
Hemoglobin: 8.8 g/dL — ABNORMAL LOW (ref 12.0–15.0)
MCH: 28.7 pg (ref 26.0–34.0)
MCHC: 32.2 g/dL (ref 30.0–36.0)
MCV: 88.9 fL (ref 80.0–100.0)
Platelets: 177 10*3/uL (ref 150–400)
RBC: 3.07 MIL/uL — ABNORMAL LOW (ref 3.87–5.11)
RDW: 14.4 % (ref 11.5–15.5)
WBC: 5.4 10*3/uL (ref 4.0–10.5)
nRBC: 0 % (ref 0.0–0.2)

## 2023-04-13 LAB — GLUCOSE, CAPILLARY
Glucose-Capillary: 104 mg/dL — ABNORMAL HIGH (ref 70–99)
Glucose-Capillary: 107 mg/dL — ABNORMAL HIGH (ref 70–99)

## 2023-04-13 LAB — TSH: TSH: 0.934 u[IU]/mL (ref 0.450–4.500)

## 2023-04-13 MED ORDER — CLOPIDOGREL BISULFATE 75 MG PO TABS
75.0000 mg | ORAL_TABLET | Freq: Every day | ORAL | 0 refills | Status: DC
Start: 2023-04-13 — End: 2023-05-01

## 2023-04-13 MED ORDER — ASPIRIN 81 MG PO TBEC: 81.0000 mg | DELAYED_RELEASE_TABLET | Freq: Every day | ORAL | 11 refills | Status: AC

## 2023-04-13 NOTE — Progress Notes (Signed)
 Internal Medicine Clinic Attending  Case discussed with the resident at the time of the visit.  We reviewed the resident's history and exam and pertinent patient test results.  I agree with the assessment, diagnosis, and plan of care documented in the resident's note.  [name] presented with hypotension, MAP <65, in the setting of several days of diarrhea and low oral intake. BP improved with oral fluids while in clinic. However, [name] also described symptoms of isolated right arm weakness yesterday concerning for TIA vs stroke. Stat labs with acute worsening of chronic kidney disease. Direct admission for evaluation and management of these multiple issues.

## 2023-04-13 NOTE — Evaluation (Signed)
 Occupational Therapy Evaluation Patient Details Name: [name] MRN: [id number] DOB: October 03, 1955 Today's Date: 04/13/2023   History of Present Illness   Pt is a 68 y.o. female admitted 3/26 with dx of TIA. Pt with c/o weakness, fatigue, and RUE numbness. MIR negative. PMH: T2DM, HTN, HLD, CKD stage 3b     Clinical Impressions Patient admitted for above and presents near baseline at modified independent level for ADLs, transfers and functional mobility. Cognition, sensation, and vision are [facility].  Pt presents with R hand weakness and decreased coordination. Provided blue foam for hand strengthening and provided HEP for [place] [name] tasks/exercises.  No further OT needs have been identified, pt has no further questions or concerns, and OT will sign off. Recommend follow up with outpatient OT at dc. Thank you for this referral!       If plan is discharge home, recommend the following:         Functional Status Assessment         Equipment Recommendations   None recommended by OT     Recommendations for Other Services         Precautions/Restrictions   Precautions Precautions: None Restrictions Weight Bearing Restrictions Per Provider Order: No     Mobility Bed Mobility Overal bed mobility: Modified Independent                  Transfers Overall transfer level: Modified independent Equipment used: Rollator (4 wheels)                      Balance Overall balance assessment: Mild deficits observed, not formally tested                                         ADL either performed or assessed with clinical judgement   ADL Overall ADL's : Modified independent                                             Vision Patient Visual Report: No change from baseline Vision Assessment?: No apparent visual deficits     Perception         Praxis         Pertinent Vitals/Pain Pain Assessment Pain Assessment:  No/denies pain     Extremity/Trunk Assessment Upper Extremity Assessment Upper Extremity Assessment: Right hand dominant;RUE deficits/detail RUE Deficits / Details: decreased strength and coordination in R hand, proximal to wrist WFL RUE Sensation: WNL RUE Coordination: decreased fine motor   Lower Extremity Assessment Lower Extremity Assessment: Defer to PT evaluation   Cervical / Trunk Assessment Cervical / Trunk Assessment: Kyphotic   Communication Communication Communication: No apparent difficulties   Cognition Arousal: Alert Behavior During Therapy: WFL for tasks assessed/performed Cognition: No apparent impairments                               Following commands: Intact       Cueing  General Comments      provided [name] handout for HEP and blue squeeze foam and reviewed exercises   Exercises     Shoulder Instructions      Home Living Family/patient expects to be discharged to:: Private residence Living Arrangements: Alone Available Help  at Discharge: Family;Friend(s);Available PRN/intermittently Type of Home: House Home Access: Stairs to enter [employer] of Steps: 3 Entrance Stairs-Rails: Right;Left;Can reach both Home Layout: One level     Bathroom Shower/Tub: Chief Strategy Officer: Standard     Home Equipment: Rollator (4 wheels);Shower seat;Cane - single point          Prior Functioning/Environment Prior Level of Function : Independent/Modified Independent             Mobility Comments: rollator at baseline. [facility] ADLs Comments: mod I    OT Problem List: Decreased strength;Decreased coordination;Impaired UE functional use   OT Treatment/Interventions:        OT Goals(Current goals can be found in the care plan section)   Acute Rehab OT Goals Patient Stated Goal: get my R hand back to normal OT Goal Formulation: With patient   OT Frequency:       Co-evaluation               AM-PAC OT "6 Clicks" Daily Activity     Outcome Measure Help from another person eating meals?: None Help from another person taking care of personal grooming?: None Help from another person toileting, which includes using toliet, bedpan, or urinal?: None Help from another person bathing (including washing, rinsing, drying)?: None Help from another person to put on and taking off regular upper body clothing?: None Help from another person to put on and taking off regular lower body clothing?: None 6 Click Score: 24   End of Session Nurse Communication: Mobility status  Activity Tolerance: Patient tolerated treatment well Patient left: with call bell/phone within reach;Other (comment) (sitting EOB)  OT Visit Diagnosis: Muscle weakness (generalized) (M62.81)                Time: 4098-1191 OT Time Calculation (min): 27 min Charges:  OT General Charges $OT Visit: 1 Visit OT Evaluation $OT Eval Low Complexity: 1 Low OT Treatments $Therapeutic Exercise: 8-22 mins  Barry Brunner, OT Acute Rehabilitation Services Office [phone] Secure Chat Preferred    [name] 04/13/2023, 9:31 AM

## 2023-04-13 NOTE — Discharge Summary (Addendum)
 Name: [name] MRN: [id number] DOB: 11-04-1955 68 y.o. PCP: Modena Slater, DO  Date of Admission: 04/12/2023  7:47 PM Date of Discharge: 04/13/2023 Attending Physician: Dr.  Ninetta Lights   Discharge Diagnosis: Principal Problem:   TIA (transient ischemic attack) Active Problems:   Diabetes mellitus (HCC)   Hyperlipidemia   Secondary hypertension    Discharge Medications: Allergies as of 04/13/2023       Reactions   Codeine Itching, Swelling   swelling in throat   Pravastatin Other (See Comments)   cramps   Latex Rash        Medication List     STOP taking these medications    famotidine 20 MG tablet Commonly known as: PEPCID       TAKE these medications    Accu-Chek Aviva Plus w/Device Kit Use to monitor blood sugars   Accu-Chek Guide test strip Generic drug: glucose blood USE ONE STRIP TO TEST THREE TIMES A DAY   Accu-Chek Guide Test test strip Generic drug: glucose blood Use as instructed   Accu-Chek Softclix Lancets lancets USE ONE LANCET TO TEST THREE TIMES A DAY   acetaminophen 500 MG tablet Commonly known as: TYLENOL Take 500-1,000 mg by mouth every 6 (six) hours as needed for moderate pain or headache.   Adult Blood Pressure Cuff Lg Kit Use to monitor your BP daily and bring log during each visit   aspirin EC 81 MG tablet Take 1 tablet (81 mg total) by mouth daily. Swallow whole.   atorvastatin 40 MG tablet Commonly known as: LIPITOR TAKE 1 TABLET BY MOUTH DAILY   Bag Balm Oint Apply to feet twice daily What changed:  how much to take how to take this when to take this additional instructions   Biotin 5000 MCG Caps Take 5,000 mcg by mouth daily.   clopidogrel 75 MG tablet Commonly known as: Plavix Take 1 tablet (75 mg total) by mouth daily for 21 days.   cycloSPORINE 0.05 % ophthalmic emulsion Commonly known as: RESTASIS Place 1 drop into both eyes 2 (two) times daily. 8 am and 1700   ferrous sulfate 325 (65 FE) MG  tablet Take 1 tablet every other day with breakfast   lidocaine 5 % Commonly known as: LIDODERM Place 1 patch onto the skin daily. Remove & Discard patch within 12 hours or as directed by MD What changed:  when to take this reasons to take this additional instructions   loperamide HCl 1 MG/7.5ML solution Commonly known as: Imodium A-D Take 4 mg once, then 2 mg after each loose stool. Maximum 8 mg per day.   pantoprazole 20 MG tablet Commonly known as: PROTONIX TAKE 1 TABLET BY MOUTH 2 TIMES A DAY   polyethylene glycol powder 17 GM/SCOOP powder Commonly known as: GLYCOLAX/MIRALAX Mix as directed and take 17 g by mouth 2 (two) times daily. What changed:  when to take this reasons to take this   REFRESH OP Place 1 drop into both eyes daily as needed (dry eyes).   Rocklatan 0.02-0.005 % Soln Generic drug: Netarsudil-Latanoprost Place 1 drop into both eyes every evening.   Senna-S 8.6-50 MG tablet Generic drug: senna-docusate Take 1 tablet by mouth at bedtime.   Simbrinza 1-0.2 % Susp Generic drug: Brinzolamide-Brimonidine Place into both eyes in the morning and at bedtime.   vitamin C 1000 MG tablet Take 1,000 mg by mouth daily. Rose hips        Disposition and follow-up:   [name]  was discharged from [facility] in Good condition.  At the hospital follow up visit please address:  1.  Follow-up:  a. For her recent TIA, she has been initiated in DAPT. Please ensure adherence to this regimen.She is supposed to take ASA and Plavix but discontinue the plavix after 3 weeks. Kindly evaluate her hand weakness and stress on the signs of stoke that would require immediate ED visit.    b. For her AKI, I stopped her lisinopril and metformin at discharge.Kindly repeat a BMP and restart lisinopril if appropriate.  2.  Labs / imaging needed at time of follow-up: BMP  3.  Pending labs/ test needing follow-up: N/A  4.  Medication Changes Started  on Plavix and aspirin Held her lisinopril  Follow-up Appointments:  Follow-up Information     Modena Slater, DO Follow up.   Specialty: Internal Medicine Contact information: [address] [place] [address] [phone]         Rudene Christians, DO Follow up on 04/20/2023.   Specialty: Internal Medicine Contact information: [address] [place] [address] [phone]                 Hospital Course by problem list: TIA [name] is a 68 y.o. F with pertinent PMH of T2DM, HTN, HLD, CKD stage 3b, who presented with profound fatigue and dizziness with some weakness of R hand who was  admitted for CVA rule-out. Follow up MRI/MRA were all negative. This TIA was thought to be secondary to hypotension secondary to dehydration and was given some fluids.  Her hand weakness has resolved and she did not show any signs of focal neurological deficits on reevaluation.  She got an echo to rule out cardiac etiologies but that was unremarkable as well.  [name] was discharged with instructions to hold her lisinopril until she follows up with her PCP.   Diarrhea Reported loose stools 4-5 times a day since Monday but resolving per patient. No new medications. No further episodes on presentation and she denied  blood in stool. Afebrile. Electrolytes stable. Suspected viral gastroenteritis. We encouraged po fluid intake and monitored her while she was hospitalized.    AKI on CKD stage 3b She had acute on chronic kidney disease stage IIIb on arrival.  Was thought this was  likely due to diarrhea and poor p.o. intake.  On urinalysis, her urine color was colorless and had a very low specific gravity of 1.002.  Patient was given 500 L bolus of fluids yes.  Resumed her Jardiance on discharge had held her metformin until she follows up with her primary care physician on Thursday 04/20/2023.    Discharge Subjective: Patient was seen at bedside after she had seen PT/OT.  She denied  any weakness ,shortness of breath or any chest pain.  Discharge Exam:   BP 124/82 (BP Location: Left Arm)   Pulse 75   Temp (!) 97.3 F (36.3 C) (Oral)   Resp 18   SpO2 100%  Constitutional: well-appearing woman, sitting on bed, in no acute distress HENT: normocephalic atraumatic, mucous membranes moist Eyes: conjunctiva non-erythematous Neck: supple Cardiovascular: regular rate and rhythm, no m/r/g Pulmonary/Chest: normal work of breathing on room air, lungs clear to auscultation bilaterally Abdominal: soft, non-tender, non-distended MSK: normal bulk and tone Neurological: alert & oriented x 3, 5/5 strength in bilateral upper and lower extremities, normal gait Skin: warm and dry Psych: Happy mood and normal affect  Pertinent Labs, Studies, and  Procedures:     Latest Ref Rng & Units 04/13/2023    3:57 AM 12/10/2022    3:50 AM 12/09/2022    5:58 AM  CBC  WBC 4.0 - 10.5 K/uL 5.4  5.6  5.8   Hemoglobin 12.0 - 15.0 g/dL 8.8  9.1  9.3   Hematocrit 36.0 - 46.0 % 27.3  26.5  26.9   Platelets 150 - 400 K/uL 177  174  177        Latest Ref Rng & Units 04/13/2023    3:57 AM 04/12/2023   11:30 AM 03/06/2023   10:02 AM  CMP  Glucose 70 - 99 mg/dL 161  096  045   BUN 8 - 23 mg/dL 42  46  27   Creatinine 0.44 - 1.00 mg/dL 4.09  8.11  9.14   Sodium 135 - 145 mmol/L 138  135  140   Potassium 3.5 - 5.1 mmol/L 4.4  4.8  4.3   Chloride 98 - 111 mmol/L 115  110  106   CO2 22 - 32 mmol/L 18  15  19    Calcium 8.9 - 10.3 mg/dL 9.1  9.4  9.1   Total Protein 6.5 - 8.1 g/dL 6.1     Total Bilirubin 0.0 - 1.2 mg/dL 0.2     Alkaline Phos 38 - 126 U/L 63     AST 15 - 41 U/L 13     ALT 0 - 44 U/L 6       ECHOCARDIOGRAM COMPLETE BUBBLE STUDY Result Date: 04/13/2023    ECHOCARDIOGRAM REPORT   Patient Name:   [name] [facility] Date of Exam: 04/13/2023 Medical Rec #:  [id number]        Height:       68.0 in Accession #:    [id number]       Weight:       202.4 lb Date of Birth:  1955/07/19       BSA:           2.054 m Patient Age:    27 years         BP:           124/82 mmHg Patient Gender: F                HR:           68 bpm. Exam Location:  Inpatient Procedure: 2D Echo, Cardiac Doppler, Color Doppler and Saline Contrast Bubble            Study (Both Spectral and Color Flow Doppler were utilized during            procedure). Indications:    TIA  History:        Patient has no prior history of Echocardiogram examinations.                 TIA; Risk Factors:Diabetes, Dyslipidemia, Hypertension and Sleep                 Apnea.  Sonographer:    Sheralyn Boatman RDCS Referring Phys: [id number] JEFFREY C HATCHER IMPRESSIONS  1. Left ventricular ejection fraction, by estimation, is 60 to 65%. The left ventricle has normal function. The left ventricle has no regional wall motion abnormalities. There is mild left ventricular hypertrophy. Left ventricular diastolic parameters are consistent with Grade I diastolic dysfunction (impaired relaxation).  2. Right ventricular systolic function is normal. The right ventricular size is normal.  3. The mitral valve is  normal in structure. Mild mitral valve regurgitation. No evidence of mitral stenosis.  4. The aortic valve is tricuspid. Aortic valve regurgitation is not visualized. No aortic stenosis is present.  5. Pulmonic valve regurgitation is moderate.  6. The inferior vena cava is normal in size with greater than 50% respiratory variability, suggesting right atrial pressure of 3 mmHg.  7. Agitated saline contrast bubble study was negative, with no evidence of any interatrial shunt. FINDINGS  Left Ventricle: Left ventricular ejection fraction, by estimation, is 60 to 65%. The left ventricle has normal function. The left ventricle has no regional wall motion abnormalities. The left ventricular internal cavity size was normal in size. There is  mild left ventricular hypertrophy. Left ventricular diastolic parameters are consistent with Grade I diastolic dysfunction (impaired relaxation).  Right Ventricle: The right ventricular size is normal. No increase in right ventricular wall thickness. Right ventricular systolic function is normal. Left Atrium: Left atrial size was normal in size. Right Atrium: Right atrial size was normal in size. Pericardium: There is no evidence of pericardial effusion. Mitral Valve: The mitral valve is normal in structure. Mild mitral valve regurgitation. No evidence of mitral valve stenosis. Tricuspid Valve: The tricuspid valve is normal in structure. Tricuspid valve regurgitation is not demonstrated. No evidence of tricuspid stenosis. Aortic Valve: The aortic valve is tricuspid. Aortic valve regurgitation is not visualized. No aortic stenosis is present. Pulmonic Valve: The pulmonic valve was normal in structure. Pulmonic valve regurgitation is moderate. No evidence of pulmonic stenosis. Aorta: The aortic root is normal in size and structure. Venous: The inferior vena cava is normal in size with greater than 50% respiratory variability, suggesting right atrial pressure of 3 mmHg. IAS/Shunts: No atrial level shunt detected by color flow Doppler. Agitated saline contrast was given intravenously to evaluate for intracardiac shunting. Agitated saline contrast bubble study was negative, with no evidence of any interatrial shunt. There  is no evidence of a patent foramen ovale.  LEFT VENTRICLE PLAX 2D LVIDd:         3.80 cm      Diastology LVIDs:         2.40 cm      LV e' medial:    7.94 cm/s LV PW:         1.20 cm      LV E/e' medial:  11.0 LV IVS:        1.20 cm      LV e' lateral:   12.10 cm/s LVOT diam:     2.30 cm      LV E/e' lateral: 7.2 LV SV:         94 LV SV Index:   46 LVOT Area:     4.15 cm  LV Volumes (MOD) LV vol d, MOD A2C: 108.0 ml LV vol d, MOD A4C: 88.0 ml LV vol s, MOD A2C: 42.7 ml LV vol s, MOD A4C: 28.3 ml LV SV MOD A2C:     65.3 ml LV SV MOD A4C:     88.0 ml LV SV MOD BP:      69.5 ml RIGHT VENTRICLE             IVC RV S prime:     11.10 cm/s  IVC diam:  1.90 cm TAPSE (M-mode): 1.9 cm LEFT ATRIUM           Index        RIGHT ATRIUM           Index LA  diam:      3.60 cm 1.75 cm/m   RA Area:     16.50 cm LA Vol (A2C): 26.8 ml 13.05 ml/m  RA Volume:   45.70 ml  22.25 ml/m LA Vol (A4C): 39.3 ml 19.13 ml/m  AORTIC VALVE             PULMONIC VALVE LVOT Vmax:   99.10 cm/s  PR End Diast Vel: 1.78 msec LVOT Vmean:  65.100 cm/s LVOT VTI:    0.226 m  AORTA Ao Root diam: 3.20 cm Ao Asc diam:  3.30 cm MITRAL VALVE MV Area (PHT): 3.42 cm     SHUNTS MV Decel Time: 222 msec     Systemic VTI:  0.23 m MV E velocity: 87.40 cm/s   Systemic Diam: 2.30 cm MV A velocity: 101.00 cm/s MV E/A ratio:  0.87 Donato Schultz MD Electronically signed by Donato Schultz MD Signature Date/Time: 04/13/2023/3:09:56 PM    Final    MR BRAIN WO CONTRAST Result Date: 04/13/2023 CLINICAL DATA:  Neuro deficit with stroke suspected EXAM: MRI HEAD WITHOUT CONTRAST MRA HEAD WITHOUT CONTRAST TECHNIQUE: Multiplanar, multi-echo pulse sequences of the brain and surrounding structures were acquired without intravenous contrast. Angiographic images of the Circle of Willis were acquired using MRA technique without intravenous contrast. COMPARISON:  12/11/2022 head CT FINDINGS: MRI HEAD FINDINGS Brain: No acute infarction, hemorrhage, hydrocephalus, extra-axial collection or mass lesion. No atrophy or significant white matter disease. No abnormal mineralization or chronic microhemorrhage. Scattered dural ossification. Vascular: Major flow voids are preserved Skull and upper cervical spine: Normal marrow signal Sinuses/Orbits: Negative MRA HEAD FINDINGS Anterior circulation: Vessels are smoothly contoured and diffusely patent. No branch occlusion, beading, or aneurysm. Posterior circulation: Hypointense nodular signal at the posterior lower basilar is attributed to calcific plaque by prior head CT. No flow reducing stenosis, branch occlusion, beading, or aneurysm. IMPRESSION: Brain MRI: No emergent finding including  infarct. MRA: Mild atherosclerosis without acute finding or significant proximal stenosis. Electronically Signed   By: Tiburcio Pea M.D.   On: 04/13/2023 04:31   MR ANGIO HEAD WO CONTRAST Result Date: 04/13/2023 CLINICAL DATA:  Neuro deficit with stroke suspected EXAM: MRI HEAD WITHOUT CONTRAST MRA HEAD WITHOUT CONTRAST TECHNIQUE: Multiplanar, multi-echo pulse sequences of the brain and surrounding structures were acquired without intravenous contrast. Angiographic images of the Circle of Willis were acquired using MRA technique without intravenous contrast. COMPARISON:  12/11/2022 head CT FINDINGS: MRI HEAD FINDINGS Brain: No acute infarction, hemorrhage, hydrocephalus, extra-axial collection or mass lesion. No atrophy or significant white matter disease. No abnormal mineralization or chronic microhemorrhage. Scattered dural ossification. Vascular: Major flow voids are preserved Skull and upper cervical spine: Normal marrow signal Sinuses/Orbits: Negative MRA HEAD FINDINGS Anterior circulation: Vessels are smoothly contoured and diffusely patent. No branch occlusion, beading, or aneurysm. Posterior circulation: Hypointense nodular signal at the posterior lower basilar is attributed to calcific plaque by prior head CT. No flow reducing stenosis, branch occlusion, beading, or aneurysm. IMPRESSION: Brain MRI: No emergent finding including infarct. MRA: Mild atherosclerosis without acute finding or significant proximal stenosis. Electronically Signed   By: Tiburcio Pea M.D.   On: 04/13/2023 04:31   US RENAL Result Date: 04/13/2023 CLINICAL DATA:  Acute renal injury EXAM: RENAL / URINARY TRACT ULTRASOUND COMPLETE COMPARISON:  None Available. FINDINGS: Right Kidney: Renal measurements: 8.7 x 4.6 x 4.2 cm. = volume: 88 mL. No mass lesion or hydronephrosis is noted. Left Kidney: Renal measurements: 9.2 x 4.9 x 4.6 cm. = volume: 116 mL.  Echogenicity within normal limits. No mass or hydronephrosis visualized.  Bladder: Appears normal for degree of bladder distention. Other: None. IMPRESSION: No acute abnormality noted. Electronically Signed   By: Alcide Clever M.D.   On: 04/13/2023 00:15     Discharge Instructions: Discharge Instructions     Diet general   Complete by: As directed    Discharge instructions   Complete by: As directed    To [name] or their caretakers,  They were admitted to [facility] on 04/12/2023 for evaluation and treatment of:  Principal Problem:   TIA (transient ischemic attack)  Resolved Problems:   * No resolved hospital problems. *  The evaluation suggested hypotension . They were treated for hand weakness and AKI.  They were discharged from the hospital on 04/13/23. I recommend the following after leaving the hospital:  - You have an appointment at the clinic on 04/20/2023. - I am holding your Lisinopril and metformin  due to your blood pressure  and your acute kidney injury. Your PCP will let you know if its appropriate to resume when you see them next week -You will be taking Plavix and Aspirin for 3 weeks and then continue Aspirin once daily until your PCP or a Neurologist says otherwise    For questions about your care plan, until you are able to see your primary doctor: Call [phone]. Dial 0 for the operator. Ask for the internal medicine resident on call.  Kathleen Lime , MD 04/13/2023, 10:30 AM   Increase activity slowly   Complete by: As directed        Signed: Kathleen Lime, MD Internal Medicine Teaching Service Pager [phone]

## 2023-04-13 NOTE — TOC Transition Note (Signed)
 Transition of Care [facility]) - Discharge Note   Patient Details  Name: [name] MRN: [id number] Date of Birth: 06/30/1955  Transition of Care [facility] CM/SW Contact:  Tom-Johnson, Hershal Coria, RN Phone Number: 04/13/2023, 4:02 PM   Clinical Narrative:     Patient is scheduled for discharge today.  Outpatient OT referral, hospital f/u and discharge instructions on AVS. Friend, Britta Mccreedy to transport at discharge.  No further TOC needs noted.        Final next level of care: OP Rehab Barriers to Discharge: Barriers Resolved   Patient Goals and CMS Choice Patient states their goals for this hospitalization and ongoing recovery are:: To return home CMS Medicare.gov Compare Post Acute Care list provided to:: Patient Choice offered to / list presented to : Patient      Discharge Placement                Patient to be transferred to facility by: Friend Name of family member notified: [facility] Additional resources added to the After Visit Summary for                  DME Arranged: N/A DME Agency: NA       HH Arranged: NA HH Agency: NA        Social Drivers of Health (SDOH) Interventions SDOH Screenings   Food Insecurity: No Food Insecurity (04/12/2023)  Housing: Low Risk  (04/13/2023)  Transportation Needs: No Transportation Needs (04/13/2023)  Utilities: Not At Risk (04/13/2023)  Alcohol Screen: Low Risk  (10/26/2022)  Depression (PHQ2-9): Low Risk  (03/06/2023)  Financial Resource Strain: Low Risk  (10/26/2022)  Physical Activity: Inactive (10/26/2022)  Social Connections: Moderately Integrated (10/26/2022)  Stress: No Stress Concern Present (10/26/2022)  Tobacco Use: Medium Risk (03/06/2023)  Health Literacy: Adequate Health Literacy (10/26/2022)     Readmission Risk Interventions    12/07/2022    4:26 PM  Readmission Risk Prevention Plan  Post Dischage Appt Complete  Medication Screening Complete  Transportation  Screening Complete

## 2023-04-13 NOTE — Plan of Care (Signed)

## 2023-04-13 NOTE — Care Management Obs Status (Signed)
 MEDICARE OBSERVATION STATUS NOTIFICATION   Patient Details  Name: [name] MRN: [id number] Date of Birth: December 07, 1955   Medicare Observation Status Notification Given:  Yes    Tom-Johnson, Hershal Coria, RN 04/13/2023, 3:49 PM

## 2023-04-13 NOTE — Evaluation (Signed)
 Physical Therapy Evaluation Patient Details Name: [name] MRN: [id number] DOB: February 03, 1955 Today's Date: 04/13/2023  History of Present Illness  Pt is a 68 y.o. female admitted 3/26 with dx of TIA. Pt with c/o weakness, fatigue, and RUE numbness. MIR negative. PMH: T2DM, HTN, HLD, CKD stage 3b  Clinical Impression  PT eval complete. Pt mod I bed mobility, transfers, and ambulation 250' with RW. Steady gait. No LOB. BLE strength symmetrical and intact. Pt is at baseline for functional mobility. No further skilled PT intervention indicated. PT signing off.         If plan is discharge home, recommend the following:     Can travel by private vehicle        Equipment Recommendations None recommended by PT  Recommendations for Other Services       Functional Status Assessment Patient has not had a recent decline in their functional status     Precautions / Restrictions Precautions Precautions: None      Mobility  Bed Mobility Overal bed mobility: Modified Independent             General bed mobility comments: HOB elevated    Transfers Overall transfer level: Modified independent Equipment used: Rollator (4 wheels)                    Ambulation/Gait Ambulation/Gait assistance: Modified independent (Device/Increase time) Gait Distance (Feet): 250 Feet Assistive device: Rollator (4 wheels) Gait Pattern/deviations: Step-through pattern, Decreased stride length Gait velocity: WFL Gait velocity interpretation: 1.31 - 2.62 ft/sec, indicative of limited community ambulator   General Gait Details: steady gait with rollator  Stairs            Wheelchair Mobility     Tilt Bed    Modified Rankin (Stroke Patients Only)       Balance Overall balance assessment: Mild deficits observed, not formally tested                                           Pertinent Vitals/Pain Pain Assessment Pain Assessment: No/denies pain     Home Living Family/patient expects to be discharged to:: Private residence Living Arrangements: Alone Available Help at Discharge: Family;Friend(s);Available PRN/intermittently Type of Home: House Home Access: Stairs to enter Entrance Stairs-Rails: Right;Left;Can reach both Entrance Stairs-Number of Steps: 3   Home Layout: One level Home Equipment: Rollator (4 wheels);Shower seat;Cane - single point      Prior Function Prior Level of Function : Independent/Modified Independent             Mobility Comments: rollator at baseline. [facility]es to the [facility] 2x week. ADLs Comments: mod I     Extremity/Trunk Assessment   Upper Extremity Assessment Upper Extremity Assessment: Defer to OT evaluation    Lower Extremity Assessment Lower Extremity Assessment: Overall WFL for tasks assessed (symmetrical and intact)    Cervical / Trunk Assessment Cervical / Trunk Assessment: Kyphotic  Communication   Communication Communication: No apparent difficulties    Cognition Arousal: Alert Behavior During Therapy: WFL for tasks assessed/performed   PT - Cognitive impairments: No apparent impairments                         Following commands: Intact       Cueing       General Comments General comments (skin integrity, edema, etc.): VSS  on RA    Exercises     Assessment/Plan    PT Assessment Patient does not need any further PT services  PT Problem List         PT Treatment Interventions      PT Goals (Current goals can be found in the Care Plan section)  Acute Rehab PT Goals Patient Stated Goal: home PT Goal Formulation: All assessment and education complete, DC therapy    Frequency       Co-evaluation               AM-PAC PT "6 Clicks" Mobility  Outcome Measure Help needed turning from your back to your side while in a flat bed without using bedrails?: None Help needed moving from lying on your back to sitting on the side of a flat bed without  using bedrails?: None Help needed moving to and from a bed to a chair (including a wheelchair)?: None Help needed standing up from a chair using your arms (e.g., wheelchair or bedside chair)?: None Help needed to walk in hospital room?: None Help needed climbing 3-5 steps with a railing? : A Little 6 Click Score: 23    End of Session   Activity Tolerance: Patient tolerated treatment well Patient left: in chair;with call bell/phone within reach Nurse Communication: Mobility status PT Visit Diagnosis: Other abnormalities of gait and mobility (R26.89)    Time: 1610-9604 PT Time Calculation (min) (ACUTE ONLY): 15 min   Charges:   PT Evaluation $PT Eval Low Complexity: 1 Low   PT General Charges $$ ACUTE PT VISIT: 1 Visit         Ferd Glassing., PT  Office # [phone]   Ilda Foil 04/13/2023, 8:14 AM

## 2023-04-13 NOTE — Discharge Instructions (Addendum)
       To [name] or their caretakers,  They were admitted to [facility] on 04/12/2023 for evaluation and treatment of:  Principal Problem:   TIA (transient ischemic attack) Active Problems:   Diabetes mellitus (HCC)   Hyperlipidemia   Secondary hypertension  Resolved Problems:   * No resolved hospital problems. *  The evaluation suggested hypotension . They were treated for hand weakness and AKI.  They were discharged from the hospital on 04/13/23. I recommend the following after leaving the hospital:  - You have an appointment at the clinic on 04/20/2023. -- I am holding your Lisinopril due to your blood pressure  your acute kidney injury. Your PCP will let you know if its appropriate to resume when you see them next week  - You will be taking Plavix and Aspirin for 3 weeks and then continue Aspirin once daily until your PCP or a Neurologist says otherwise    For questions about your care plan, until you are able to see your primary doctor: Call [phone]. Dial 0 for the operator. Ask for the internal medicine resident on call.  Kathleen Lime , MD 04/13/2023, 3:53 PM

## 2023-04-13 NOTE — Progress Notes (Signed)
  Echocardiogram 2D Echocardiogram has been performed.  [name] 04/13/2023, 2:18 PM

## 2023-04-14 ENCOUNTER — Telehealth: Payer: Self-pay

## 2023-04-14 NOTE — Transitions of Care (Post Inpatient/ED Visit) (Signed)
 04/14/2023  Name: [name] MRN: [id number] DOB: [birth year]  Today's TOC FU Call Status: Today's TOC FU Call Status:: Successful TOC FU Call Completed TOC FU Call Complete Date: 04/14/23 Patient's Name and Date of Birth confirmed.  Transition Care Management Follow-up Telephone Call Date of Discharge: 04/13/23 Discharge Facility: [facility]) Type of Discharge: Emergency Department Reason for ED Visit: Other: (TIA) How have you been since you were released from the hospital?: Better Any questions or concerns?: No  Items Reviewed: Did you receive and understand the discharge instructions provided?: Yes Medications obtained,verified, and reconciled?: Yes (Medications Reviewed) Any new allergies since your discharge?: No Dietary orders reviewed?: Yes Do you have support at home?: Yes People in Home: other relative(s)  Medications Reviewed Today: Medications Reviewed Today     Reviewed by Karena Addison, LPN (Licensed Practical Nurse) on 04/14/23 at 1111  Med List Status: <None>   Medication Order Taking? Sig Documenting Provider Last Dose Status Informant  Accu-Chek Softclix Lancets lancets [id number] No USE ONE LANCET TO TEST THREE TIMES A DAY Modena Slater, DO Past Week Active Self  acetaminophen (TYLENOL) 500 MG tablet [id number] No Take 500-1,000 mg by mouth every 6 (six) hours as needed for moderate pain or headache. [provider] Past Week Active Self  Ascorbic Acid (VITAMIN C) 1000 MG tablet [id number] No Take 1,000 mg by mouth daily. Rose hips [provider] Past Week Active Self  aspirin EC 81 MG tablet [id number]  Take 1 tablet (81 mg total) by mouth daily. Swallow whole. Kathleen Lime, MD  Active   atorvastatin (LIPITOR) 40 MG tablet [id number] No TAKE 1 TABLET BY MOUTH DAILY Modena Slater, DO 04/12/2023 Morning Active Self  Biotin 5000 MCG CAPS [id number] No Take 5,000 mcg by mouth daily. [provider] 04/12/2023 Morning Active Self   Blood Glucose Monitoring Suppl (ACCU-CHEK AVIVA PLUS) w/Device KIT [id number] No Use to monitor blood sugars Carmel Sacramento, MD Past Week Active Self  Blood Pressure Monitoring (ADULT BLOOD PRESSURE CUFF LG) KIT [id number] No Use to monitor your BP daily and bring log during each visit Modena Slater, DO Past Week Active Self  clopidogrel (PLAVIX) 75 MG tablet [id number]  Take 1 tablet (75 mg total) by mouth daily for 21 days. Kathleen Lime, MD  Active   cycloSPORINE (RESTASIS) 0.05 % ophthalmic emulsion [id number] No Place 1 drop into both eyes 2 (two) times daily. 8 am and 1700 [provider] 04/12/2023 Morning Active Self  Emollient (BAG BALM) OINT [id number] No Apply to feet twice daily  Patient taking differently: Apply 1 Application topically 2 (two) times daily. Apply to feet   Modena Slater, DO 04/12/2023 Morning Active Self  ferrous sulfate 325 (65 FE) MG tablet [id number] No Take 1 tablet every other day with breakfast Modena Slater, DO 04/12/2023 Morning Active Self  glucose blood (ACCU-CHEK GUIDE TEST) test strip [id number] No Use as instructed Modena Slater, DO Past Week Active Self  glucose blood (ACCU-CHEK GUIDE) test strip [id number] No USE ONE STRIP TO TEST THREE TIMES A DAY Katsadouros, Vasilios, MD Past Week Active Self  lidocaine (LIDODERM) 5 % [id number] No Place 1 patch onto the skin daily. Remove & Discard patch within 12 hours or as directed by MD  Patient taking differently: Place 1 patch onto the skin daily as needed (for pain).   Philomena Doheny, MD Past Month Active Self           Med Note (SATTERFIELD, Lenon Curt E   Wed Apr 12, 2023  9:58 PM) Still has on hand for prn pain  loperamide HCl (IMODIUM A-D) 1 MG/7.5ML solution [id number] No Take 4 mg once, then 2 mg after each loose stool. Maximum 8 mg per day.  Patient not taking: Reported on 04/12/2023   Rudene Christians, DO Not Taking Active Self  Netarsudil-Latanoprost (ROCKLATAN) 0.02-0.005 % SOLN [id number] No Place 1 drop  into both eyes every evening. [provider] 04/11/2023 Evening Active Self  pantoprazole (PROTONIX) 20 MG tablet [id number] No TAKE 1 TABLET BY MOUTH 2 TIMES A DAY Modena Slater, DO 04/12/2023 Morning Active Self  polyethylene glycol powder (GLYCOLAX/MIRALAX) 17 GM/SCOOP powder [id number] No Mix as directed and take 17 g by mouth 2 (two) times daily.  Patient taking differently: Take 17 g by mouth daily as needed for moderate constipation.   Philomena Doheny, MD Past Week Active Self  Polyvinyl Alcohol-Povidone (REFRESH OP) [id number] No Place 1 drop into both eyes daily as needed (dry eyes). [provider] Past Week Active Self    Discontinued 02/11/12 1144 (Patient has not taken in last 30 days)   senna-docusate (SENOKOT-S) 8.6-50 MG tablet [id number] No Take 1 tablet by mouth at bedtime. Colbert Coyer, Alyson Locket, MD 04/11/2023 Bedtime Active Self  SIMBRINZA 1-0.2 % SUSP [id number] No Place into both eyes in the morning and at bedtime. [provider] 04/12/2023 Evening Active Self  Med List Note Wilkie Aye, Stanford Breed, CPhT 12/14/20 1349): Cpap            Home Care and Equipment/Supplies: Were Home Health Services Ordered?: NA Any new equipment or medical supplies ordered?: NA  Functional Questionnaire: Do you need assistance with bathing/showering or dressing?: No Do you need assistance with meal preparation?: No Do you need assistance with eating?: No Do you have difficulty maintaining continence: No Do you need assistance with getting out of bed/getting out of a chair/moving?: No Do you have difficulty managing or taking your medications?: No  Follow up appointments reviewed: PCP Follow-up appointment confirmed?: Yes Date of PCP follow-up appointment?: 04/20/23 Follow-up Provider: [facility] Follow-up appointment confirmed?: No Reason Specialist Follow-Up Not Confirmed: Patient has Specialist Provider Number and will Call for  Appointment Do you need transportation to your follow-up appointment?: No Do you understand care options if your condition(s) worsen?: Yes-patient verbalized understanding    SIGNATURE Karena Addison, LPN [facility] Nurse Health Advisor Direct Dial [phone]

## 2023-04-16 ENCOUNTER — Other Ambulatory Visit: Payer: Self-pay | Admitting: Student

## 2023-04-17 NOTE — Telephone Encounter (Signed)
 Medication discontinued on 04/12/23

## 2023-04-20 ENCOUNTER — Ambulatory Visit: Admitting: Internal Medicine

## 2023-04-20 VITALS — BP 124/61 | HR 72 | Temp 98.4°F | Ht 68.0 in | Wt 199.6 lb

## 2023-04-20 DIAGNOSIS — G459 Transient cerebral ischemic attack, unspecified: Secondary | ICD-10-CM | POA: Diagnosis not present

## 2023-04-20 DIAGNOSIS — E1122 Type 2 diabetes mellitus with diabetic chronic kidney disease: Secondary | ICD-10-CM

## 2023-04-20 DIAGNOSIS — N183 Chronic kidney disease, stage 3 unspecified: Secondary | ICD-10-CM

## 2023-04-20 NOTE — Patient Instructions (Addendum)
 Thank you, [name] for allowing [place] to provide your care today.   I am glad to hear that your symptoms are better. Please continue taking plavix until April 17. You will be on aspirin 81 mg long term.  I will call you tomorrow with labs results. This will help [place] decide if jardiance is an ok medication long term. Please call the kidney doctor and get follow-up for your kidneys.  [facility] [place]   Address: [address] [place], [place] [address] Phone: [phone]  Make sure that you're not taking miralax or senna as this can cause diarrhea.  I have ordered the following labs for you:  Lab Orders         Renal function panel       I have ordered the following medication/changed the following medications:   Stop the following medications: Medications Discontinued During This Encounter  Medication Reason   senna-docusate (SENOKOT-S) 8.6-50 MG tablet    polyethylene glycol powder (GLYCOLAX/MIRALAX) 17 GM/SCOOP powder      Start the following medications: No orders of the defined types were placed in this encounter.    Follow up: 1 month  We look forward to seeing you next time. Please call our clinic at [phone] if you have any questions or concerns. The best time to call is Monday-Friday from 9am-4pm, but there is someone available 24/7. If after hours or the weekend, call the main hospital number and ask for the Internal Medicine Resident On-Call. If you need medication refills, please notify your pharmacy one week in advance and they will send [place] a request.   Thank you for trusting me with your care. Wishing you the best!   Rudene Christians, DO [facility]

## 2023-04-20 NOTE — Progress Notes (Signed)
 Subjective:  CC: hospital follow-up  HPI:  [name] is a 68 y.o. female with a past medical history of CKD stage 3, diabetes, HTN, history of TIA who presents today for hospital follow-up.  Follow up Hospitalization  Patient was admitted to [facility] on 3/26 and discharged on 3/27. She was treated for TIA. Treatment for this included stroke evaluation. Telephone follow up was done on 04/14/23 She reports good compliance with treatment. She reports this condition is improved.  ----------------------------------------------------------------------------------------- - She presented to clinic on 3/26 with weakness, diarrhea, and unilateral upper extremity weakness.  She was direct admitted for transient ischemic attack workup, AKI on CKD, and hypotension.  MRI/MRA were negative for acute infarct.  Echo completed with no PFO or thrombus seen.  She was discharged with 3 weeks of dual antiplatelet therapy.  She should finish Plavix on 4/17.  Please see problem based assessment and plan for additional details.  Past Medical History:  Diagnosis Date   Arthritis    Chronic normocytic anemia    CKD (chronic kidney disease) stage 3, GFR 30-59 ml/min (HCC) 02/02/2013   Diabetes mellitus type II, controlled (HCC) 06/17/2009   Diabetic ulcer of both feet (HCC) 01/07/2010   Qualifier: Diagnosis of  By: [name] MD, Amanjot     Essential hypertension 06/17/2009   GERD (gastroesophageal reflux disease)    Glaucoma    H/O hiatal hernia    Hardware complicating wound infection (HCC) 12/28/2020   Hyperlipidemia    MRSA (methicillin resistant Staphylococcus aureus)    Obstructive sleep apnea 05/26/2010   Polymicrobial bacterial infection 12/28/2020    MEDICATIONS:  Aspirin 81 mg Atorvastatin 40 mg Plavix 75 mg Cyclosporine ophthalmic solution Iron supplement pantoprazole 20 mg twice daily Rocklatan eyedrop Simbrinza eyedrop  Lisinopril and metformin discontinued at  discharge Jardiance???  Family History  Problem Relation Age of Onset   Diabetes Mother    Hypertension Mother    Dementia Mother    Heart disease Father    Stroke Father    Diabetes Father    Hypertension Father    Gout Father    Deep vein thrombosis Father    Breast cancer Cousin    Breast cancer Cousin     Past Surgical History:  Procedure Laterality Date   ACHILLES TENDON LENGTHENING Left 11/06/2014   Procedure: LEFT ACHILLES TENDON LENGTHENING, ;  Surgeon: Toni Arthurs, MD;  Location: [facility] OR;  Service: Orthopedics;  Laterality: Left;   APPENDECTOMY  1975   FOOT ARTHRODESIS Left 11/06/2014   Procedure: TALONAVICULAR CANCELLOUS CUBOID ARTHRODESIS WITH CORRECTIVE OSTEOTOMY ;  Surgeon: Toni Arthurs, MD;  Location: [facility] OR;  Service: Orthopedics;  Laterality: Left;   FOOT ARTHRODESIS Right 05/14/2020   Procedure: Right heelcord lengthening, subtalar and talonavicular arthrodesis, talus exostectomy, talus corrective osteotomy;  Surgeon: Toni Arthurs, MD;  Location: [facility] OR;  Service: Orthopedics;  Laterality: Right;   HARDWARE REMOVAL Left 02/19/2015   Procedure: REMOVAL OF LEFT FOOT EXTERNAL FIXATOR, CASTING OF LEFT ANKLE ;  Surgeon: Toni Arthurs, MD;  Location: [facility];  Service: Orthopedics;  Laterality: Left;   HARDWARE REMOVAL Right 12/17/2020   Procedure: Removal of deep implant right foot;  Surgeon: Toni Arthurs, MD;  Location: [facility] OR;  Service: Orthopedics;  Laterality: Right;   I & D EXTREMITY Left 2011   "ulcer on my foot got infected" (01/30/2013)   I & D EXTREMITY Right 05/06/2021   Procedure: Irrigation and excisional debridement of right ankle abcess;  Surgeon:  Toni Arthurs, MD;  Location: [facility];  Service: Orthopedics;  Laterality: Right;    MULTIPLE TOOTH EXTRACTIONS Bilateral    OOPHORECTOMY Bilateral 1975-1976     Social History   Socioeconomic History   Marital status: Single    Spouse name: Not on file   Number of children:  Not on file   Years of education: Not on file   Highest education level: Not on file  Occupational History   Occupation: Disabled  Tobacco Use   Smoking status: Former    Current packs/day: 0.00    Types: Cigarettes    Start date: 12/22/1972    Quit date: 12/22/1972    Years since quitting: 50.3   Smokeless tobacco: Never  Vaping Use   Vaping status: Never Used  Substance and Sexual Activity   Alcohol use: No   Drug use: No   Sexual activity: Not Currently  Other Topics Concern   Not on file  Social History Narrative   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   Smoked when she was a teenager   no alcohol   no Drugs      Current Social History 10/26/2018        Patient lives on the main level of a 3 floor boarding house with 3 other tenants. There are 3 steps with handrails up to the entrance the patient uses.       Patient's method of transportation is personal car.      The highest level of education was Bachelor's Degree.      The patient currently disabled.      Identified important Relationships are "My cousins, [name] and Lianne Moris, and my dear friend Mliss Sax."      Pets : None       Interests / Fun: "I love to read, write, photography: go around [place] taking photos of the [address], Pop-Art, Art Museums, cooking, hanging out with friends, several ministries with my Church, especially working with the youth."      Current Stressors: "Worrying about someone to care for me if anything happens to me. 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[place]atte, BSN, RN-BC          Social Drivers of Health   Financial Resource Strain: Low Risk  (10/26/2022)   Overall Financial Resource Strain (CARDIA)    Difficulty of Paying Living Expenses: Not very hard  Food Insecurity: No Food Insecurity (04/12/2023)   Hunger Vital Sign    Worried About  Running Out of Food in the Last Year: Never true    Ran Out of Food in the Last Year: Never true  Transportation Needs: No Transportation Needs (04/13/2023)   PRAPARE - Administrator, Civil Service (Medical): No    Lack of Transportation (Non-Medical): No  Physical Activity: Inactive (10/26/2022)   Exercise Vital Sign    Days of Exercise per Week: 0 days    Minutes of Exercise per Session: 0 min  Stress: No Stress Concern Present (10/26/2022)   [employer] of Occupational Health - Occupational Stress Questionnaire    Feeling of Stress : Only a little  Social Connections: Moderately Integrated (10/26/2022)   Social Connection and Isolation Panel [NHANES]    Frequency of Communication with Friends and Family: More than three times a week  Frequency of Social Gatherings with Friends and Family: Three times a week    Attends Religious Services: More than 4 times per year    Active Member of Clubs or Organizations: Yes    Attends Banker Meetings: More than 4 times per year    Marital Status: Never married  Intimate Partner Violence: Not At Risk (04/13/2023)   Humiliation, Afraid, Rape, and Kick questionnaire    Fear of Current or Ex-Partner: No    Emotionally Abused: No    Physically Abused: No    Sexually Abused: No    Review of Systems: ROS negative except for what is noted on the assessment and plan.  Objective:   Vitals:   04/20/23 1112  BP: 124/61  Pulse: 72  Temp: 98.4 F (36.9 C)  TempSrc: Oral  SpO2: 100%  Weight: 199 lb 9.6 oz (90.5 kg)  Height: 5\' 8"  (1.727 m)    Physical Exam: Constitutional: well-appearing, in no acute distress Cardiovascular: regular rate and rhythm, no m/r/g Pulmonary/Chest: normal work of breathing on room air, lungs clear to auscultation bilaterally Abdominal: soft, non-tender, non-distended MSK: Upper extremities with 5 out of 5 strength, grip strength 5 out of 5 bilaterally Neurological: alert & oriented x  3, no speech abnormalities noted Skin: warm and dry  Assessment & Plan:  TIA (transient ischemic attack) Imaging evaluation showed no acute infarcts.  Echo did not show signs of thrombus or PFO to explain TIA symptoms.  She feels that the symptoms in her right upper 70 have all resolved.  This week she has noted some difficulty with word finding that seems new.  Has been taking aspirin 81 mg and Plavix 75 mg. A1c at 6.3 during hospital admission.  LDL at 63. P: Continue aspirin 81 mg indefinitely Plavix 75 mg until August 17, patient is aware to discontinue medication after that Continue Lipitor 40 mg Hopefully can restart Jardiance if renal function is stable as she was on this previously.  If GFR less than 10 would discontinue Jardiance as well Metformin discontinued in setting of GFR  CKD stage 3 due to type 2 diabetes mellitus (HCC) Creatinine at 2.98 on discharge with GFR of 17.  Previously BMPs were consistent with CKD 3B with GFR in the 20s.  Concerned that this reflects a worsening in her renal function.  GFR did not improve much with fluids in the hospital.  Metformin and lisinopril discontinued with newly worsened renal function.  Renal ultrasound completed in hospital and was unrevealing. P: Number provided for nephrology in AVS.  I talked with her about importance of contacting kidney doctors to establish care with them with worsening of renal function.  We talked about hopefully stabilizing kidney function but if it continues to progress and electrolytes become abnormal that she may need dialysis in the future. -renal function panel  Addendum: Bicarb 650 mg TID started   Patient discussed with Dr. Hurshel Keys Franck Vinal, D.O. [facility]  PGY-3 Pager: [phone]  Phone: [phone] Date 04/21/2023  Time 9:02 AM

## 2023-04-20 NOTE — Assessment & Plan Note (Addendum)
 Creatinine at 2.98 on discharge with GFR of 17.  Previously BMPs were consistent with CKD 3B with GFR in the 20s.  Concerned that this reflects a worsening in her renal function.  GFR did not improve much with fluids in the hospital.  Metformin and lisinopril discontinued with newly worsened renal function.  Renal ultrasound completed in hospital and was unrevealing. P: Number provided for nephrology in AVS.  I talked with her about importance of contacting kidney doctors to establish care with them with worsening of renal function.  We talked about hopefully stabilizing kidney function but if it continues to progress and electrolytes become abnormal that she may need dialysis in the future. -renal function panel  Addendum: Bicarb 650 mg TID started

## 2023-04-20 NOTE — Assessment & Plan Note (Signed)
 Imaging evaluation showed no acute infarcts.  Echo did not show signs of thrombus or PFO to explain TIA symptoms.  She feels that the symptoms in her right upper 70 have all resolved.  This week she has noted some difficulty with word finding that seems new.  Has been taking aspirin 81 mg and Plavix 75 mg. A1c at 6.3 during hospital admission.  LDL at 63. P: Continue aspirin 81 mg indefinitely Plavix 75 mg until August 17, patient is aware to discontinue medication after that Continue Lipitor 40 mg Hopefully can restart Jardiance if renal function is stable as she was on this previously.  If GFR less than 10 would discontinue Jardiance as well Metformin discontinued in setting of GFR

## 2023-04-21 DIAGNOSIS — T300 Burn of unspecified body region, unspecified degree: Secondary | ICD-10-CM | POA: Diagnosis not present

## 2023-04-21 DIAGNOSIS — M14672 Charcot's joint, left ankle and foot: Secondary | ICD-10-CM | POA: Diagnosis not present

## 2023-04-21 DIAGNOSIS — E1140 Type 2 diabetes mellitus with diabetic neuropathy, unspecified: Secondary | ICD-10-CM | POA: Diagnosis not present

## 2023-04-21 DIAGNOSIS — M14671 Charcot's joint, right ankle and foot: Secondary | ICD-10-CM | POA: Diagnosis not present

## 2023-04-21 LAB — RENAL FUNCTION PANEL
Albumin: 4.3 g/dL (ref 3.9–4.9)
BUN/Creatinine Ratio: 17 (ref 12–28)
BUN: 45 mg/dL — ABNORMAL HIGH (ref 8–27)
CO2: 16 mmol/L — ABNORMAL LOW (ref 20–29)
Calcium: 9.8 mg/dL (ref 8.7–10.3)
Chloride: 106 mmol/L (ref 96–106)
Creatinine, Ser: 2.64 mg/dL — ABNORMAL HIGH (ref 0.57–1.00)
Glucose: 113 mg/dL — ABNORMAL HIGH (ref 70–99)
Phosphorus: 4.7 mg/dL — ABNORMAL HIGH (ref 3.0–4.3)
Potassium: 4.6 mmol/L (ref 3.5–5.2)
Sodium: 138 mmol/L (ref 134–144)
eGFR: 19 mL/min/{1.73_m2} — ABNORMAL LOW (ref >59)

## 2023-04-21 MED ORDER — SODIUM BICARBONATE 650 MG PO TABS: 650.0000 mg | ORAL_TABLET | Freq: Two times a day (BID) | ORAL | 1 refills | Status: DC

## 2023-04-21 NOTE — Progress Notes (Signed)
 Internal Medicine Clinic Attending  Case discussed with the resident at the time of the visit.  We reviewed the resident's history and exam and pertinent patient test results.  I agree with the assessment, diagnosis, and plan of care documented in the resident's note.

## 2023-04-21 NOTE — Addendum Note (Signed)
 Addended by: Lucille Passy on: 04/21/2023 09:02 AM   Modules accepted: Orders

## 2023-04-24 ENCOUNTER — Encounter: Payer: Self-pay | Admitting: *Deleted

## 2023-04-25 NOTE — Progress Notes (Signed)
 [employer] [facility] Weekly Session  Patient Details  Name: [name] MRN: [id number] Date of Birth: [birth year] Age: 68 y.o. PCP: Modena Slater, DO  Vitals:   04/25/23 0802  Weight: 202 lb (91.6 kg)     [employer] Weekly seesion - 04/24/23 1400       [facility] "PREP" Location   [facility] "PREP" Location [facility]      Weekly Session   Topic Discussed Finding support   Accountabitiy partners, tips for staying on track, identifying non supportive people.   Classes attended to date 29             Remo Lipps 04/25/2023, 8:03 AM

## 2023-04-27 DIAGNOSIS — E1140 Type 2 diabetes mellitus with diabetic neuropathy, unspecified: Secondary | ICD-10-CM | POA: Diagnosis not present

## 2023-04-27 DIAGNOSIS — L97522 Non-pressure chronic ulcer of other part of left foot with fat layer exposed: Secondary | ICD-10-CM | POA: Diagnosis not present

## 2023-04-27 DIAGNOSIS — L97519 Non-pressure chronic ulcer of other part of right foot with unspecified severity: Secondary | ICD-10-CM | POA: Diagnosis not present

## 2023-04-27 DIAGNOSIS — M79672 Pain in left foot: Secondary | ICD-10-CM | POA: Diagnosis not present

## 2023-04-27 DIAGNOSIS — M14671 Charcot's joint, right ankle and foot: Secondary | ICD-10-CM | POA: Diagnosis not present

## 2023-04-27 DIAGNOSIS — M14672 Charcot's joint, left ankle and foot: Secondary | ICD-10-CM | POA: Diagnosis not present

## 2023-04-27 DIAGNOSIS — T300 Burn of unspecified body region, unspecified degree: Secondary | ICD-10-CM | POA: Diagnosis not present

## 2023-04-28 ENCOUNTER — Ambulatory Visit: Payer: Self-pay | Admitting: *Deleted
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
## 2023-04-29 ENCOUNTER — Other Ambulatory Visit: Payer: Self-pay | Admitting: Student

## 2023-05-01 NOTE — Telephone Encounter (Signed)
 I called and spoke to the patient she stated she has a appointment with ortho on Wednesday and Friday, they will be addressing her toe. Patient  also stated her sugars have went down. I advised the patient to give us  a call if she would like a appointment with us .

## 2023-05-03 DIAGNOSIS — M79672 Pain in left foot: Secondary | ICD-10-CM | POA: Diagnosis not present

## 2023-05-05 ENCOUNTER — Other Ambulatory Visit: Payer: Self-pay | Admitting: Orthopedic Surgery

## 2023-05-05 DIAGNOSIS — M869 Osteomyelitis, unspecified: Secondary | ICD-10-CM | POA: Diagnosis not present

## 2023-05-08 ENCOUNTER — Encounter: Payer: Self-pay | Admitting: *Deleted

## 2023-05-08 ENCOUNTER — Telehealth: Payer: Self-pay | Admitting: *Deleted

## 2023-05-08 NOTE — Progress Notes (Signed)
 Reviewed chart with Dr. Leilani Punter at [facility] and patients case will need to be done at Main OR due to recent TIA

## 2023-05-08 NOTE — Telephone Encounter (Unsigned)
 Copied from CRM [id number]. Topic: Clinical - Medication Question >> May 08, 2023  9:15 AM Shelby Dessert H wrote: Reason for CRM: Patient was calling to find out if she needs to stop her blood thinners because she is having surgery on Thursday. Patients callback number is [phone].

## 2023-05-09 NOTE — Telephone Encounter (Signed)
 Thank you :)

## 2023-05-09 NOTE — Telephone Encounter (Signed)
 Pt stated she did not stop Plavix  on the 17th b/c Dr Lydia Sams had sent in a refill on 4/14 with refills so she thought she should continue taking it. Stated she did stop ASA per the surgery office. She did take Plavix  yesterday; she wants to know if she should not take it today and tomorrow. Surgery is Thursday.

## 2023-05-09 NOTE — Telephone Encounter (Signed)
 Called pt - no answer; left message on self-identified vm to call the office about her blood thinner.
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
## 2023-05-10 ENCOUNTER — Encounter (HOSPITAL_COMMUNITY): Payer: Self-pay | Admitting: Orthopedic Surgery
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
## 2023-05-10 NOTE — Anesthesia Preprocedure Evaluation (Addendum)
 Anesthesia Evaluation  Patient identified by MRN, date of birth, ID band Patient awake    Reviewed: Allergy & Precautions, NPO status , Patient's Chart, lab work & pertinent test results  Airway Mallampati: II  TM Distance: >3 FB Neck ROM: Full    Dental  (+) Dental Advisory Given, Upper Dentures, Partial Lower   Pulmonary sleep apnea and Continuous Positive Airway Pressure Ventilation , former smoker   Pulmonary exam normal breath sounds clear to auscultation       Cardiovascular hypertension, Normal cardiovascular exam Rhythm:Regular Rate:Normal  Echo 04/13/23: 1. Left ventricular ejection fraction, by estimation, is 60 to 65%. The  left ventricle has normal function. The left ventricle has no regional  wall motion abnormalities. There is mild left ventricular hypertrophy.  Left ventricular diastolic parameters  are consistent with Grade I diastolic dysfunction (impaired relaxation).   2. Right ventricular systolic function is normal. The right ventricular  size is normal.   3. The mitral valve is normal in structure. Mild mitral valve  regurgitation. No evidence of mitral stenosis.   4. The aortic valve is tricuspid. Aortic valve regurgitation is not  visualized. No aortic stenosis is present.   5. Pulmonic valve regurgitation is moderate.   6. The inferior vena cava is normal in size with greater than 50%  respiratory variability, suggesting right atrial pressure of 3 mmHg.   7. Agitated saline contrast bubble study was negative, with no evidence  of any interatrial shunt.     Neuro/Psych TIA Neuromuscular disease CVA    GI/Hepatic Neg liver ROS, hiatal hernia,GERD  Medicated,,  Endo/Other  diabetes, Type 2  Obesity   Renal/GU Renal InsufficiencyRenal disease     Musculoskeletal  (+) Arthritis ,    Abdominal   Peds  Hematology  (+) Blood dyscrasia (Plavix )   Anesthesia Other Findings    Reproductive/Obstetrics                             Anesthesia Physical Anesthesia Plan  ASA: 3  Anesthesia Plan: MAC   Post-op Pain Management: Tylenol  PO (pre-op)*   Induction: Intravenous  PONV Risk Score and Plan: 2 and TIVA, Treatment may vary due to age or medical condition and Midazolam   Airway Management Planned: Natural Airway and Simple Face Mask  Additional Equipment:   Intra-op Plan:   Post-operative Plan:   Informed Consent: I have reviewed the patients History and Physical, chart, labs and discussed the procedure including the risks, benefits and alternatives for the proposed anesthesia with the patient or authorized representative who has indicated his/her understanding and acceptance.     Dental advisory given  Plan Discussed with: CRNA  Anesthesia Plan Comments: (PAT note by Rudy Costain, PA-C: 68 year old female with pertinent history including HTN, HLD, CKD, non-insulin -dependent DM2, GERD on PPI, OSA on CPAP, CKD 3B, hiatal hernia.  Recent admission 3/26 to 04/13/2023 for TIA.  She presented with profound fatigue and dizziness as well as some weakness of the right hand.  She was admitted for CVA rule out.  MRI/MRA were negative.  Echo was benign. TIA was thought secondary to hypotension from dehydration and she was given fluids.  Right hand weakness reportedly resolved.  She was advised to hold her lisinopril  and follow-up with PCP.  She was also discharged on 3 weeks of DAPT with aspirin  and Plavix , completed Plavix  on 4/17, now on 81 mg ASA alone.  She was seen in follow-up by PCP Dr.  Katie Masters on 04/20/2023.  At that time she was recommended to continue aspirin  81 mg indefinitely, Jardiance  being held in the hopes that it could later be restarted if renal function improved.  She was started on bicarb 650 mg 3 times daily.  She was also advised to follow-up with nephrology.  BMP 04/20/2023 reviewed, creatinine elevated 2.65 (down from  prior 2.98 on 04/13/2023), otherwise unremarkable.  CBC 04/13/2023 reviewed, chronic anemia with hemoglobin 8.8, otherwise unremarkable.  Patient will need day of surgery evaluation.  EKG 04/12/2023: Normal sinus rhythm. Rate 66. Left axis deviation. Right bundle branch block  TTE with bubble study 04/13/2023: 1. Left ventricular ejection fraction, by estimation, is 60 to 65%. The  left ventricle has normal function. The left ventricle has no regional  wall motion abnormalities. There is mild left ventricular hypertrophy.  Left ventricular diastolic parameters  are consistent with Grade I diastolic dysfunction (impaired relaxation).  2. Right ventricular systolic function is normal. The right ventricular  size is normal.  3. The mitral valve is normal in structure. Mild mitral valve  regurgitation. No evidence of mitral stenosis.  4. The aortic valve is tricuspid. Aortic valve regurgitation is not  visualized. No aortic stenosis is present.  5. Pulmonic valve regurgitation is moderate.  6. The inferior vena cava is normal in size with greater than 50%  respiratory variability, suggesting right atrial pressure of 3 mmHg.  7. Agitated saline contrast bubble study was negative, with no evidence  of any interatrial shunt.    )        Anesthesia Quick Evaluation

## 2023-05-10 NOTE — Progress Notes (Signed)
 Anesthesia Chart Review: Same day workup  68 year old female with pertinent history including HTN, HLD, CKD, non-insulin -dependent DM2, GERD on PPI, OSA on CPAP, CKD 3B, hiatal hernia.  Recent admission 3/26 to 04/13/2023 for TIA.  She presented with profound fatigue and dizziness as well as some weakness of the right hand.  She was admitted for CVA rule out.  MRI/MRA were negative.  Echo was benign. TIA was thought secondary to hypotension from dehydration and she was given fluids.  Right hand weakness reportedly resolved.  She was advised to hold her lisinopril  and follow-up with PCP.  She was also discharged on 3 weeks of DAPT with aspirin  and Plavix , completed Plavix  on 4/17, now on 81 mg ASA alone.  She was seen in follow-up by PCP Dr. Karalee Oscar on 04/20/2023.  At that time she was recommended to continue aspirin  81 mg indefinitely, Jardiance  being held in the hopes that it could later be restarted if renal function improved.  She was started on bicarb 650 mg 3 times daily.  She was also advised to follow-up with nephrology.  BMP 04/20/2023 reviewed, creatinine elevated 2.65 (down from prior 2.98 on 04/13/2023), otherwise unremarkable.  CBC 04/13/2023 reviewed, chronic anemia with hemoglobin 8.8, otherwise unremarkable.  Patient will need day of surgery evaluation.  EKG 04/12/2023: Normal sinus rhythm. Rate 66. Left axis deviation. Right bundle branch block  TTE with bubble study 04/13/2023:  1. Left ventricular ejection fraction, by estimation, is 60 to 65%. The  left ventricle has normal function. The left ventricle has no regional  wall motion abnormalities. There is mild left ventricular hypertrophy.  Left ventricular diastolic parameters  are consistent with Grade I diastolic dysfunction (impaired relaxation).   2. Right ventricular systolic function is normal. The right ventricular  size is normal.   3. The mitral valve is normal in structure. Mild mitral valve  regurgitation. No  evidence of mitral stenosis.   4. The aortic valve is tricuspid. Aortic valve regurgitation is not  visualized. No aortic stenosis is present.   5. Pulmonic valve regurgitation is moderate.   6. The inferior vena cava is normal in size with greater than 50%  respiratory variability, suggesting right atrial pressure of 3 mmHg.   7. Agitated saline contrast bubble study was negative, with no evidence  of any interatrial shunt.     Edilia Gordon [facility] Short Stay Center/Anesthesiology Phone [phone] 05/10/2023 12:13 PM

## 2023-05-10 NOTE — Progress Notes (Signed)
 PCP - Jonelle Neri, DO  Cardiologist -   PPM/ICD - denies Device Orders - n/a Rep Notified - n/a  Chest x-ray - 12-07-22 EKG - 04-12-23 Stress Test -  ECHO - 04-13-23 Cardiac Cath -   Sleep study 03-28-17 CPAP - use nightly  GLP-1 - denies  Fasting Blood Sugar - per patient blood sugar ranges 120-125 Checks Blood Sugar BID  Blood Thinner Instructions: clopidogrel  (PLAVIX ) LAST DOSE per patient 05-08-23 Aspirin  Instructions: aspirin  LAST DOSE per patient 05-08-23  ERAS Protcol - Clear liquids until 4:30 am  COVID TEST- n/a  Anesthesia review: yes recent ED visit for TIA, hx, DM, HTN, OSA,  Patient verbally denies any shortness of breath, fever, cough and chest pain during phone call   -------------  SDW INSTRUCTIONS given:  Your procedure is scheduled on May 11, 2023.  Report to [facility] Main Entrance "A" at 5:30 A.M., and check in at the Admitting office.  Call this number if you have problems the morning of surgery:  [phone]   Remember:  Do not eat after midnight the night before your surgery  You may drink clear liquids until 4:30 the morning of your surgery.   Clear liquids allowed are: Water, Non-Citrus Juices (without pulp), Carbonated Beverages, Clear Tea, Black Coffee Only, and Gatorade    Take these medicines the morning of surgery with A SIP OF WATER  acetaminophen  (TYLENOL   atorvastatin  (LIPITOR)  cycloSPORINE  (RESTASIS )  pantoprazole  (PROTONIX )  Polyvinyl Alcohol -Povidone (REFRESH OP)     As of today, STOP taking any Aspirin  (unless otherwise instructed by your surgeon) Aleve, Naproxen, Ibuprofen , Motrin , Advil , Goody's, BC's, all herbal medications, fish oil, and all vitamins. THIS INCLUDES YOUR lidocaine  (LIDODERM ).   WHAT DO I DO ABOUT MY DIABETES MEDICATION?   Do not take oral diabetes medicines (pills) the morning of surgery.    The day of surgery, do not take other diabetes injectables, including Byetta (exenatide), Bydureon  (exenatide ER), Victoza (liraglutide), or Trulicity (dulaglutide).  If your CBG is greater than 220 mg/dL, you may take  of your sliding scale (correction) dose of insulin .   HOW TO MANAGE YOUR DIABETES BEFORE AND AFTER SURGERY  Why is it important to control my blood sugar before and after surgery? Improving blood sugar levels before and after surgery helps healing and can limit problems. A way of improving blood sugar control is eating a healthy diet by:  Eating less sugar and carbohydrates  Increasing activity/exercise  Talking with your doctor about reaching your blood sugar goals High blood sugars (greater than 180 mg/dL) can raise your risk of infections and slow your recovery, so you will need to focus on controlling your diabetes during the weeks before surgery. Make sure that the doctor who takes care of your diabetes knows about your planned surgery including the date and location.  How do I manage my blood sugar before surgery? Check your blood sugar at least 4 times a day, starting 2 days before surgery, to make sure that the level is not too high or low.  Check your blood sugar the morning of your surgery when you wake up and every 2 hours until you get to the Short Stay unit.  If your blood sugar is less than 70 mg/dL, you will need to treat for low blood sugar: Do not take insulin . Treat a low blood sugar (less than 70 mg/dL) with  cup of clear juice (cranberry or apple), 4 glucose tablets, OR glucose gel. Recheck blood sugar in  15 minutes after treatment (to make sure it is greater than 70 mg/dL). If your blood sugar is not greater than 70 mg/dL on recheck, call [phone] for further instructions. Report your blood sugar to the short stay nurse when you get to Short Stay.  If you are admitted to the hospital after surgery: Your blood sugar will be checked by the staff and you will probably be given insulin  after surgery (instead of oral diabetes medicines) to make  sure you have good blood sugar levels. The goal for blood sugar control after surgery is 80-180 mg/dL.                       Do not wear jewelry, make up, or nail polish            Do not wear lotions, powders, perfumes/colognes, or deodorant.            Do not shave 48 hours prior to surgery.  Men may shave face and neck.            Do not bring valuables to the hospital.            [facility] is not responsible for any belongings or valuables.  Do NOT Smoke (Tobacco/Vaping) 24 hours prior to your procedure If you use a CPAP at night, you may bring all equipment for your overnight stay.   Contacts, glasses, dentures or bridgework may not be worn into surgery.      For patients admitted to the hospital, discharge time will be determined by your treatment team.   Patients discharged the day of surgery will not be allowed to drive home, and someone needs to stay with them for 24 hours.    Special instructions:   [facility]- Preparing For Surgery  Before surgery, you can play an important role. Because skin is not sterile, your skin needs to be as free of germs as possible. You can reduce the number of germs on your skin by washing with CHG (chlorahexidine gluconate) Soap before surgery.  CHG is an antiseptic cleaner which kills germs and bonds with the skin to continue killing germs even after washing.    Oral Hygiene is also important to reduce your risk of infection.  Remember - BRUSH YOUR TEETH THE MORNING OF SURGERY WITH YOUR REGULAR TOOTHPASTE  Please do not use if you have an allergy to CHG or antibacterial soaps. If your skin becomes reddened/irritated stop using the CHG.  Do not shave (including legs and underarms) for at least 48 hours prior to first CHG shower. It is OK to shave your face.  Please follow these instructions carefully.   Shower the NIGHT BEFORE SURGERY and the MORNING OF SURGERY with DIAL Soap.   Pat yourself dry with a CLEAN TOWEL.  Wear CLEAN PAJAMAS  to bed the night before surgery  Place CLEAN SHEETS on your bed the night of your first shower and DO NOT SLEEP WITH PETS.   Day of Surgery: Please shower morning of surgery  Wear Clean/Comfortable clothing the morning of surgery Do not apply any deodorants/lotions.   Remember to brush your teeth WITH YOUR REGULAR TOOTHPASTE.   Questions were answered. Patient verbalized understanding of instructions.

## 2023-05-11 ENCOUNTER — Encounter (HOSPITAL_COMMUNITY): Payer: Self-pay | Admitting: Orthopedic Surgery

## 2023-05-11 ENCOUNTER — Other Ambulatory Visit: Payer: Self-pay

## 2023-05-11 ENCOUNTER — Encounter (HOSPITAL_BASED_OUTPATIENT_CLINIC_OR_DEPARTMENT_OTHER)
Admission: RE | Disposition: A | Discharge status: Home / Self Care | Payer: Self-pay | Source: Home / Self Care | Attending: Orthopedic Surgery

## 2023-05-11 ENCOUNTER — Ambulatory Visit (HOSPITAL_COMMUNITY): Admitting: Physician Assistant

## 2023-05-11 ENCOUNTER — Ambulatory Visit (HOSPITAL_COMMUNITY)
Admission: RE | Admit: 2023-05-11 | Discharge: 2023-05-11 | Disposition: A | Discharge status: Home / Self Care | Attending: Orthopedic Surgery | Admitting: Orthopedic Surgery

## 2023-05-11 DIAGNOSIS — Z87891 Personal history of nicotine dependence: Secondary | ICD-10-CM

## 2023-05-11 DIAGNOSIS — K219 Gastro-esophageal reflux disease without esophagitis: Secondary | ICD-10-CM | POA: Insufficient documentation

## 2023-05-11 DIAGNOSIS — I639 Cerebral infarction, unspecified: Secondary | ICD-10-CM | POA: Diagnosis not present

## 2023-05-11 DIAGNOSIS — L97521 Non-pressure chronic ulcer of other part of left foot limited to breakdown of skin: Secondary | ICD-10-CM | POA: Insufficient documentation

## 2023-05-11 DIAGNOSIS — M869 Osteomyelitis, unspecified: Secondary | ICD-10-CM | POA: Diagnosis not present

## 2023-05-11 DIAGNOSIS — G4733 Obstructive sleep apnea (adult) (pediatric): Secondary | ICD-10-CM | POA: Insufficient documentation

## 2023-05-11 DIAGNOSIS — E11621 Type 2 diabetes mellitus with foot ulcer: Secondary | ICD-10-CM | POA: Diagnosis not present

## 2023-05-11 DIAGNOSIS — E1122 Type 2 diabetes mellitus with diabetic chronic kidney disease: Secondary | ICD-10-CM | POA: Diagnosis not present

## 2023-05-11 DIAGNOSIS — I129 Hypertensive chronic kidney disease with stage 1 through stage 4 chronic kidney disease, or unspecified chronic kidney disease: Secondary | ICD-10-CM | POA: Diagnosis not present

## 2023-05-11 DIAGNOSIS — N183 Chronic kidney disease, stage 3 unspecified: Secondary | ICD-10-CM | POA: Insufficient documentation

## 2023-05-11 DIAGNOSIS — E1169 Type 2 diabetes mellitus with other specified complication: Secondary | ICD-10-CM

## 2023-05-11 DIAGNOSIS — I10 Essential (primary) hypertension: Secondary | ICD-10-CM

## 2023-05-11 HISTORY — PX: AMPUTATION TOE: SHX6595

## 2023-05-11 HISTORY — DX: Cerebral infarction, unspecified: I63.9

## 2023-05-11 LAB — GLUCOSE, CAPILLARY
Glucose-Capillary: 139 mg/dL — ABNORMAL HIGH (ref 70–99)
Glucose-Capillary: 146 mg/dL — ABNORMAL HIGH (ref 70–99)

## 2023-05-11 SURGERY — AMPUTATION, TOE
Anesthesia: Monitor Anesthesia Care | Site: Toe | Laterality: Left

## 2023-05-11 MED ORDER — PROPOFOL 10 MG/ML IV BOLUS
INTRAVENOUS | Status: DC | PRN
  Administered 2023-05-11 (×3): 20 mg via INTRAVENOUS

## 2023-05-11 MED ORDER — BUPIVACAINE HCL (PF) 0.5 % IJ SOLN
INTRAMUSCULAR | Status: AC
  Filled 2023-05-11: qty 30

## 2023-05-11 MED ORDER — FENTANYL CITRATE (PF) 100 MCG/2ML IJ SOLN: 25.0000 ug | INTRAMUSCULAR | Status: DC | PRN

## 2023-05-11 MED ORDER — ONDANSETRON HCL 4 MG/2ML IJ SOLN: 4.0000 mg | Freq: Once | INTRAMUSCULAR | Status: DC | PRN

## 2023-05-11 MED ORDER — VANCOMYCIN HCL 500 MG IV SOLR
INTRAVENOUS | Status: DC | PRN
  Administered 2023-05-11: 500 mg via TOPICAL

## 2023-05-11 MED ORDER — FENTANYL CITRATE (PF) 250 MCG/5ML IJ SOLN
INTRAMUSCULAR | Status: AC
  Filled 2023-05-11: qty 5

## 2023-05-11 MED ORDER — VANCOMYCIN HCL 500 MG IV SOLR
INTRAVENOUS | Status: AC
  Filled 2023-05-11: qty 10

## 2023-05-11 MED ORDER — ORAL CARE MOUTH RINSE: 15.0000 mL | Freq: Once | OROMUCOSAL | Status: AC

## 2023-05-11 MED ORDER — FENTANYL CITRATE (PF) 250 MCG/5ML IJ SOLN
INTRAMUSCULAR | Status: DC | PRN
  Administered 2023-05-11: 50 ug via INTRAVENOUS

## 2023-05-11 MED ORDER — CEFAZOLIN SODIUM-DEXTROSE 2-4 GM/100ML-% IV SOLN
2.0000 g | INTRAVENOUS | Status: AC
  Administered 2023-05-11: 2 g via INTRAVENOUS
  Filled 2023-05-11: qty 100

## 2023-05-11 MED ORDER — BUPIVACAINE HCL (PF) 0.25 % IJ SOLN
INTRAMUSCULAR | Status: AC
  Filled 2023-05-11: qty 30

## 2023-05-11 MED ORDER — CHLORHEXIDINE GLUCONATE 0.12 % MT SOLN
15.0000 mL | Freq: Once | OROMUCOSAL | Status: AC
  Administered 2023-05-11: 15 mL via OROMUCOSAL
  Filled 2023-05-11: qty 15

## 2023-05-11 MED ORDER — MIDAZOLAM HCL 2 MG/2ML IJ SOLN
INTRAMUSCULAR | Status: AC
  Filled 2023-05-11: qty 2

## 2023-05-11 MED ORDER — 0.9 % SODIUM CHLORIDE (POUR BTL) OPTIME
TOPICAL | Status: DC | PRN
  Administered 2023-05-11: 1000 mL

## 2023-05-11 MED ORDER — INSULIN ASPART 100 UNIT/ML IJ SOLN: 0.0000 [IU] | INTRAMUSCULAR | Status: DC | PRN

## 2023-05-11 MED ORDER — PROPOFOL 10 MG/ML IV BOLUS
INTRAVENOUS | Status: AC
  Filled 2023-05-11: qty 20

## 2023-05-11 MED ORDER — LIDOCAINE HCL (PF) 1 % IJ SOLN
INTRAMUSCULAR | Status: AC
  Filled 2023-05-11: qty 30

## 2023-05-11 MED ORDER — BUPIVACAINE-EPINEPHRINE 0.5% -1:200000 IJ SOLN
INTRAMUSCULAR | Status: DC | PRN
  Administered 2023-05-11: 10 mL

## 2023-05-11 MED ORDER — SODIUM CHLORIDE 0.9 % IV SOLN: INTRAVENOUS | Status: DC

## 2023-05-11 MED ORDER — ACETAMINOPHEN 500 MG PO TABS
1000.0000 mg | ORAL_TABLET | Freq: Once | ORAL | Status: AC
  Administered 2023-05-11: 1000 mg via ORAL
  Filled 2023-05-11: qty 2

## 2023-05-11 MED ORDER — BUPIVACAINE-EPINEPHRINE (PF) 0.5% -1:200000 IJ SOLN
INTRAMUSCULAR | Status: AC
  Filled 2023-05-11: qty 30

## 2023-05-11 SURGICAL SUPPLY — 27 items
BLADE AVERAGE 25X9 (BLADE) ×1 IMPLANT
BNDG COHESIVE 4X5 TAN STRL LF (GAUZE/BANDAGES/DRESSINGS) ×1 IMPLANT
BNDG COHESIVE 6X5 TAN ST LF (GAUZE/BANDAGES/DRESSINGS) ×1 IMPLANT
BNDG ESMARK 4X9 LF (GAUZE/BANDAGES/DRESSINGS) ×1 IMPLANT
BNDG GAUZE DERMACEA FLUFF 4 (GAUZE/BANDAGES/DRESSINGS) IMPLANT
CANISTER SUCT 3000ML PPV (MISCELLANEOUS) ×1 IMPLANT
CHLORAPREP W/TINT 26 (MISCELLANEOUS) ×1 IMPLANT
DRAPE U-SHAPE 47X51 STRL (DRAPES) ×2 IMPLANT
DRSG MEPITEL 4X7.2 (GAUZE/BANDAGES/DRESSINGS) ×1 IMPLANT
ELECTRODE REM PT RTRN 9FT ADLT (ELECTROSURGICAL) ×1 IMPLANT
GAUZE SPONGE 4X4 12PLY STRL (GAUZE/BANDAGES/DRESSINGS) ×1 IMPLANT
GLOVE BIOGEL PI IND STRL 8 (GLOVE) ×2 IMPLANT
GOWN STRL REUS W/ TWL LRG LVL3 (GOWN DISPOSABLE) ×1 IMPLANT
GOWN STRL REUS W/ TWL XL LVL3 (GOWN DISPOSABLE) ×2 IMPLANT
KIT BASIN OR (CUSTOM PROCEDURE TRAY) ×1 IMPLANT
KIT TURNOVER KIT B (KITS) ×1 IMPLANT
NS IRRIG 1000ML POUR BTL (IV SOLUTION) ×1 IMPLANT
PACK ORTHO EXTREMITY (CUSTOM PROCEDURE TRAY) ×1 IMPLANT
PAD ARMBOARD POSITIONER FOAM (MISCELLANEOUS) ×2 IMPLANT
PAD CAST 4YDX4 CTTN HI CHSV (CAST SUPPLIES) ×1 IMPLANT
SPECIMEN JAR SMALL (MISCELLANEOUS) ×1 IMPLANT
SPONGE T-LAP 18X18 ~~LOC~~+RFID (SPONGE) IMPLANT
SUCTION TUBE FRAZIER 10FR DISP (SUCTIONS) IMPLANT
SUT ETHILON 2 0 PSLX (SUTURE) ×1 IMPLANT
TOWEL GREEN STERILE (TOWEL DISPOSABLE) ×1 IMPLANT
TUBE CONNECTING 12X1/4 (SUCTIONS) IMPLANT
UNDERPAD 30X36 HEAVY ABSORB (UNDERPADS AND DIAPERS) ×1 IMPLANT

## 2023-05-11 NOTE — Anesthesia Postprocedure Evaluation (Signed)
 Anesthesia Post Note  Patient: [name]  Procedure(s) Performed: LEFT SECOND TOE AMPUTATION AT [facility] INTERPHALANGEAL JOINT (Left: Toe)     Patient location during evaluation: PACU Anesthesia Type: MAC Level of consciousness: awake and alert Pain management: pain level controlled Vital Signs Assessment: post-procedure vital signs reviewed and stable Respiratory status: spontaneous breathing, nonlabored ventilation and respiratory function stable Cardiovascular status: stable and blood pressure returned to baseline Postop Assessment: no apparent nausea or vomiting Anesthetic complications: no   No notable events documented.  Last Vitals:  Vitals:   05/11/23 0815 05/11/23 0825  BP: 131/63 139/77  Pulse: 62 66  Resp: 18 12  Temp:  36.4 C  SpO2: 100% 100%    Last Pain:  Vitals:   05/11/23 0825  TempSrc:   PainSc: 0-No pain                 Erin Havers

## 2023-05-11 NOTE — Discharge Instructions (Addendum)
 Amada Backer, MD [facility]  Please read the following information regarding your care after surgery.  Medications  You only need a prescription for the narcotic pain medicine (ex. oxycodone , Percocet, Norco).  All of the other medicines listed below are available over the counter. ? acetominophen (Tylenol ) 650 mg every 4-6 hours as you need for minor to moderate pain  Weight Bearing ? Bear weight only on your operated foot in the post-op shoe.   Cast / Splint / Dressing ? Keep your splint, cast or dressing clean and dry.  Don't put anything (coat hanger, pencil, etc) down inside of it.  If it gets damp, use a hair dryer on the cool setting to dry it.  If it gets soaked, call the office to schedule an appointment for a cast change.   After your dressing, cast or splint is removed; you may shower, but do not soak or scrub the wound.  Allow the water to run over it, and then gently pat it dry.  Swelling It is normal for you to have swelling where you had surgery.  To reduce swelling and pain, keep your toes above your nose for at least 3 days after surgery.  It may be necessary to keep your foot or leg elevated for several weeks.  If it hurts, it should be elevated.  Follow Up Call my office at [phone] when you are discharged from the hospital or surgery center to schedule an appointment to be seen two weeks after surgery.  Call my office at [phone] if you develop a fever >101.5 F, nausea, vomiting, bleeding from the surgical site or severe pain.

## 2023-05-11 NOTE — Op Note (Signed)
 05/11/2023  8:01 AM  PATIENT:  [name]  68 y.o. 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  INDICATION FOR PROCEDURE: 68 year old female with past medical history significant for diabetes complicated by peripheral neuropathy complains of a chronic ulcer at the tip of the left foot second toe.  X-rays and an MRI reveal osteomyelitis deep to the ulcer.  She has failed nonoperative treatment including wound care and antibiotics.  She presents today for left foot second toe amputation.  The risks and benefits of the alternative treatment options have been discussed in detail.  The patient wishes to proceed with surgery and specifically understands risks of bleeding, infection, nerve damage, blood clots, need for additional surgery, amputation and death.   PROCEDURE IN DETAIL:  After pre operative consent was obtained, and the correct operative site was identified, the patient was brought to the operating room and placed supine on the OR table.  Anesthesia was administered.  Pre-operative antibiotics were administered.  A surgical timeout was taken.  A metatarsal block was performed with Marcaine .  The left foot was prepped and draped in standard sterile fashion.  The foot was exsanguinated and a 4 inch Esmarch tourniquet wrapped around the ankle.  A fishmouth incision was marked on the skin proximal to the ulcer at the tip of the second toe.  The incision was made and dissection carried down through the subcutaneous tissues to the bone.   Subperiosteal dissection was then carried proximally.  The bone was cut with a bone cutter and the distal portion of the toe passed off the field.  The bone was smoothed with a rondure.  The wound was irrigated copiously.  Neurovascular bundles were cauterized.  The incision was sprinkled with vancomycin  powder and closed with simple and horizontal mattress sutures of 2-0 nylon.  Sterile dressings were applied followed by compression wrap.  The tourniquet was released after application of the dressings.  The patient was awakened from anesthesia and transported to the recovery room in stable condition.  FOLLOW UP PLAN: Weightbearing as tolerated on the left lower extremity in a flat postop shoe.  She will finish her oral antibiotics and follow-up in 2 weeks in the office.  Hopefully we can remove her sutures then.  No indication for DVT prophylaxis in this ambulatory patient.  She can resume her aspirin  and Plavix  postop day 1.    Justin Ollis PA-C was present and scrubbed for the duration of the operative case. His assistance was essential in positioning the patient, prepping and draping, gaining and maintaining exposure, performing the operation, closing and dressing the wounds and applying the splint.

## 2023-05-11 NOTE — Transfer of Care (Signed)
 Immediate Anesthesia Transfer of Care Note  Patient: [name]  Procedure(s) Performed: LEFT SECOND TOE AMPUTATION AT [facility] INTERPHALANGEAL JOINT (Left: Toe)  Patient Location: PACU  Anesthesia Type:MAC  Level of Consciousness: awake  Airway & Oxygen Therapy: Patient Spontanous Breathing  Post-op Assessment: Report given to RN and Post -op Vital signs reviewed and stable  Post vital signs: Reviewed and stable  Last Vitals:  Vitals Value Taken Time  BP 129/65 05/11/23 0810  Temp    Pulse 64   Resp 11 05/11/23 0811  SpO2 100   Vitals shown include unfiled device data.  Last Pain:  Vitals:   05/11/23 0615  TempSrc: Oral  PainSc:       Patients Stated Pain Goal: 0 (05/11/23 0602)  Complications: No notable events documented.

## 2023-05-11 NOTE — Addendum Note (Signed)
 Addendum  created 05/11/23 1139 by Merna Aase, CRNA   Attestation recorded in [place], [employer] filed

## 2023-05-12 ENCOUNTER — Encounter (HOSPITAL_COMMUNITY): Payer: Self-pay | Admitting: Orthopedic Surgery

## 2023-05-18 ENCOUNTER — Encounter: Payer: Self-pay | Admitting: *Deleted

## 2023-05-18 NOTE — Progress Notes (Signed)
 [facility] PREP Evaluation  Patient Details  Name: [name] MRN: [id number] Date of Birth: Dec 29, 1955 Age: 68 y.o. PCP: Sarah Neri, DO  Vitals:   05/10/23 1430  BP: 132/64  Pulse: 80  Resp: 20  SpO2: 98%  Weight: 203 lb (92.1 kg)  Height: 5\' 8"  (1.727 m)     [employer] Eval - 05/10/23 1445       [facility] "PREP" Location   [facility] "PREP" Location [facility]      Referral    Referring Provider [name]    Reason for referral Diabetes;High Cholesterol;Hypertension;Inactivity;Obesitity/Overweight    Program Start Date 02/27/23    Program End Date 03/12/23      Measurement   Waist Circumference 51.75 inches    Waist Circumference End Program 51.25 inches    Hip Circumference 52 inches    Hip Circumference End Program 51 inches    Body fat 41.5 percent      Mobility and Daily Activities   I find it easy to walk up or down two or more flights of stairs. 2    I have no trouble taking out the trash. 4    I do housework such as vacuuming and dusting on my own without difficulty. 4    I can easily lift a gallon of milk (8lbs). 4    I can easily walk a mile. 1    I have no trouble reaching into high cupboards or reaching down to pick up something from the floor. 4    I do not have trouble doing out-door work such as Loss adjuster, chartered, raking leaves, or gardening. 1      Mobility and Daily Activities   I feel younger than my age. 4    I feel independent. 4    I feel energetic. 4    I live an active life.  4    I feel strong. 4    I feel healthy. 3    I feel active as other people my age. 4      How fit and strong are you.   Fit and Strong Total Score 47            Past Medical History:  Diagnosis Date   Arthritis    Chronic normocytic anemia    CKD (chronic kidney disease) stage 3, GFR 30-59 ml/min (HCC) 02/02/2013   Diabetes mellitus type II, controlled (HCC) 06/17/2009   Diabetic ulcer of both feet (HCC) 01/07/2010   Qualifier: Diagnosis of  By: Evlyn Hoffmann MD, Amanjot      Essential hypertension 06/17/2009   GERD (gastroesophageal reflux disease)    Glaucoma    H/O hiatal hernia    Hardware complicating wound infection (HCC) 12/28/2020   Hyperlipidemia    MRSA (methicillin resistant Staphylococcus aureus)    Obstructive sleep apnea 05/26/2010   Polymicrobial bacterial infection 12/28/2020   Stroke [facility])    Past Surgical History:  Procedure Laterality Date   ACHILLES TENDON LENGTHENING Left 11/06/2014   Procedure: LEFT ACHILLES TENDON LENGTHENING, ;  Surgeon: Amada Backer, MD;  Location: [facility] OR;  Service: Orthopedics;  Laterality: Left;   AMPUTATION TOE Left 05/11/2023   Procedure: LEFT SECOND TOE AMPUTATION AT [facility] INTERPHALANGEAL JOINT;  Surgeon: Amada Backer, MD;  Location: [facility] OR;  Service: Orthopedics;  Laterality: Left;   APPENDECTOMY  1975   FOOT ARTHRODESIS Left 11/06/2014   Procedure: TALONAVICULAR CANCELLOUS CUBOID ARTHRODESIS WITH CORRECTIVE OSTEOTOMY ;  Surgeon: Amada Backer, MD;  Location: [facility] OR;  Service: Orthopedics;  Laterality: Left;   FOOT ARTHRODESIS Right 05/14/2020   Procedure: Right heelcord lengthening, subtalar and talonavicular arthrodesis, talus exostectomy, talus corrective osteotomy;  Surgeon: Amada Backer, MD;  Location: [facility] OR;  Service: Orthopedics;  Laterality: Right;   HARDWARE REMOVAL Left 02/19/2015   Procedure: REMOVAL OF LEFT FOOT EXTERNAL FIXATOR, CASTING OF LEFT ANKLE ;  Surgeon: Amada Backer, MD;  Location: [facility];  Service: Orthopedics;  Laterality: Left;   HARDWARE REMOVAL Right 12/17/2020   Procedure: Removal of deep implant right foot;  Surgeon: Amada Backer, MD;  Location: [facility] OR;  Service: Orthopedics;  Laterality: Right;   I & D EXTREMITY Left 2011   "ulcer on my foot got infected" (01/30/2013)   I & D EXTREMITY Right 05/06/2021   Procedure: Irrigation and excisional debridement of right ankle abcess;  Surgeon: Amada Backer, MD;  Location: [facility];  Service: Orthopedics;   Laterality: Right;    MULTIPLE TOOTH EXTRACTIONS Bilateral    OOPHORECTOMY Bilateral 1975-1976   Social History   Tobacco Use  Smoking Status Former   Current packs/day: 0.00   Types: Cigarettes   Start date: 12/22/1972   Quit date: 12/22/1972   Years since quitting: 50.4  Smokeless Tobacco Never    [name] 05/18/2023, 8:26 AM  PREP program complete. [name] completed 1 week early due to her impending foot surgery. Attended 9 of 11 education classes and 6 of 12 exercise sessions. Improved Fit and Strong survey by 6 points. She was unable to perform final metrics of FIT testing due to exercise restrictions on her foot. Weight loss 5.4lbs, waist/hip inches lost 0.25"/1". [name] was very engaged in the class and contributed to the positive camaraderie. She has a solid plan in place for continuing a modified exercise program during her recovery. Keep up the good work [name]!

## 2023-05-29 ENCOUNTER — Other Ambulatory Visit: Payer: Self-pay

## 2023-05-29 MED ORDER — CLOPIDOGREL BISULFATE 75 MG PO TABS: 75.0000 mg | ORAL_TABLET | Freq: Every day | ORAL | 0 refills | Status: AC

## 2023-05-29 NOTE — Telephone Encounter (Signed)
 Pharmacy is requesting a 90 day supply

## 2023-05-31 ENCOUNTER — Ambulatory Visit: Payer: Self-pay | Admitting: Dietician

## 2023-05-31 ENCOUNTER — Ambulatory Visit (INDEPENDENT_AMBULATORY_CARE_PROVIDER_SITE_OTHER): Admitting: Student

## 2023-05-31 ENCOUNTER — Encounter: Payer: Self-pay | Admitting: Student

## 2023-05-31 ENCOUNTER — Ambulatory Visit: Payer: Self-pay

## 2023-05-31 VITALS — BP 142/80 | HR 74 | Ht 68.0 in | Wt 212.2 lb

## 2023-05-31 DIAGNOSIS — T7840XA Allergy, unspecified, initial encounter: Secondary | ICD-10-CM | POA: Diagnosis not present

## 2023-05-31 MED ORDER — METHYLPREDNISOLONE SODIUM SUCC 125 MG IJ SOLR
125.0000 mg | Freq: Once | INTRAMUSCULAR | Status: AC
Start: 2023-05-31 — End: 2023-05-31
  Administered 2023-05-31: 125 mg via INTRAMUSCULAR

## 2023-05-31 NOTE — Progress Notes (Signed)
 Internal Medicine Clinic Attending  Case discussed with the resident at the time of the visit.  We reviewed the resident's history and exam and pertinent patient test results.  I agree with the assessment, diagnosis, and plan of care documented in the resident's note.

## 2023-05-31 NOTE — Patient Instructions (Addendum)
 [name],Thank you for allowing me to take part in your care today.  Here are your instructions.  1.  I am going to give you a steroid injection today.  You will not need any other oral steroids.  2.  Please take Zyrtec over-the-counter.  Please take hydrocortisone cream over-the-counter.  3.  I will see you at your follow-up appointment next week.  Thank you, Dr. Lydia Sams  If you have any other questions please contact the internal medicine clinic at [phone] If it is after hours, please call the [place] [facility] at [phone] and then ask the person who picks up for the resident on call.

## 2023-05-31 NOTE — Telephone Encounter (Signed)
 Copied from CRM [id number]. Topic: Clinical - Red Word Triage >> May 31, 2023  8:19 AM [name] wrote: Red Word that prompted transfer to Nurse Triage: Patient states she has broken out all over her body & not sure what has happened. She states there is redness and she has been scratching. States the rash is really bad on her back & it's going down her right leg. Patient has an upcoming appt on Monday but wants to know what she needs to do.  Chief Complaint: rash to back, right leg and breasts, tiny bumps, itching Symptoms: itching Frequency: constant Pertinent Negatives: Patient denies fever, cp, weakness Disposition: [] ED /[] Urgent Care (no appt availability in office) / [x] Appointment(In office/virtual)/ []  [facility]/ [] Home Care/ [] Refused Recommended Disposition /[] [facility]/ []  Follow-up with PCP Additional Notes: apt made for today; care advice given, denies questions; instructed to go to ER if becomes worse.   Reason for Disposition  SEVERE itching (i.e., interferes with sleep, normal activities or school)  Answer Assessment - Initial Assessment Questions 1. APPEARANCE of RASH: "Describe the rash." (e.g., spots, blisters, raised areas, skin peeling, scaly)     Red, bumps, no pus, tiny, "a lot of them" 2. SIZE: "How big are the spots?" (e.g., tip of pen, eraser, coin; inches, centimeters)     Pencil tip 3. LOCATION: "Where is the rash located?"     All over back and going down right leg, breast,  4. COLOR: "What color is the rash?" (Note: It is difficult to assess rash color in people with darker-colored skin. When this situation occurs, simply ask the caller to describe what they see.)     red 5. ONSET: "When did the rash begin?"     Yesterday 6. FEVER: "Do you have a fever?" If Yes, ask: "What is your temperature, how was it measured, and when did it start?"     no 7. ITCHING: "Does the rash itch?" If Yes, ask: "How bad is the itch?" (Scale 1-10; or  mild, moderate, severe)     Moderate to severe 8. CAUSE: "What do you think is causing the rash?"     unknown 9. MEDICINE FACTORS: "Have you started any new medicines within the last 2 weeks?" (e.g., antibiotics)      States has kidney disease, states had surgery on May 11, 2023 and was on two different antibiotics 10. OTHER SYMPTOMS: "Do you have any other symptoms?" (e.g., dizziness, headache, sore throat, joint pain)       Sinus drainage and headache. 11. PREGNANCY: "Is there any chance you are pregnant?" "When was your last menstrual period?"       na  Protocols used: Rash or Redness - [facility]

## 2023-05-31 NOTE — Assessment & Plan Note (Addendum)
 Patient presents the clinic with concerns of an acute onset of rash.  This is a urticarial rash that started yesterday afternoon.  She reports she used a new [employer] cream and the rash started after that.  She denies any new soaps, detergents, foods, travel, hiking, gardening, or any other new activities in her life.  She states the rash is very itchy.  On exam it is on her back, front chest, and on her legs.  This is likely an allergic reaction.  No concern for anaphylaxis.  Will give patient steroids as well as recommend Zyrtec.  Given patient is diabetic, did let her know that sugars may run high over the next few days.  She understood this.  Plan: - Recommend Zyrtec over-the-counter - Recommend hydrocortisone cream over-the-counter - Solu-Medrol shot administered today in clinic

## 2023-05-31 NOTE — Telephone Encounter (Signed)
 Call to patient scheduled for an appointment in the [facility].  Patient when asked still would like to see the Diabetes Educator.

## 2023-05-31 NOTE — Progress Notes (Signed)
 CC: Allergic reaction  HPI:  [name] is a 68 y.o. female with a past medical history of hypertension, type 2 diabetes mellitus, CKD stage IIIb, OSA who presents for appointment regarding rash.  Please see assessment and plan for full HPI.   Past Medical History:  Diagnosis Date   Arthritis    Chronic normocytic anemia    CKD (chronic kidney disease) stage 3, GFR 30-59 ml/min (HCC) 02/02/2013   Diabetes mellitus type II, controlled (HCC) 06/17/2009   Diabetic ulcer of both feet (HCC) 01/07/2010   Qualifier: Diagnosis of  By: Evlyn Hoffmann MD, Amanjot     Essential hypertension 06/17/2009   GERD (gastroesophageal reflux disease)    Glaucoma    H/O hiatal hernia    Hardware complicating wound infection (HCC) 12/28/2020   Hyperlipidemia    MRSA (methicillin resistant Staphylococcus aureus)    Obstructive sleep apnea 05/26/2010   Polymicrobial bacterial infection 12/28/2020   Stroke (HCC)      Current Outpatient Medications:    Accu-Chek Softclix Lancets lancets, USE ONE LANCET TO TEST THREE TIMES A DAY, Disp: 100 each, Rfl: 12   acetaminophen  (TYLENOL ) 500 MG tablet, Take 500-1,000 mg by mouth every 6 (six) hours as needed for moderate pain or headache., Disp: , Rfl:    Ascorbic Acid  (VITAMIN C ) 1000 MG tablet, Take 1,000 mg by mouth daily. Rose hips, Disp: , Rfl:    aspirin  EC 81 MG tablet, Take 1 tablet (81 mg total) by mouth daily. Swallow whole., Disp: 30 tablet, Rfl: 11   atorvastatin  (LIPITOR) 40 MG tablet, TAKE 1 TABLET BY MOUTH DAILY, Disp: 90 tablet, Rfl: 3   Biotin  5000 MCG CAPS, Take 5,000 mcg by mouth daily., Disp: , Rfl:    Blood Glucose Monitoring Suppl (ACCU-CHEK AVIVA PLUS) w/Device KIT, Use to monitor blood sugars, Disp: 1 kit, Rfl: 0   Blood Pressure Monitoring (ADULT BLOOD PRESSURE CUFF LG) KIT, Use to monitor your BP daily and bring log during each visit, Disp: 1 kit, Rfl: 0   cephALEXin  (KEFLEX ) 500 MG capsule, Take 500 mg by mouth 4 (four) times daily.,  Disp: , Rfl:    clopidogrel  (PLAVIX ) 75 MG tablet, Take 1 tablet (75 mg total) by mouth daily., Disp: 90 tablet, Rfl: 0   cycloSPORINE  (RESTASIS ) 0.05 % ophthalmic emulsion, Place 1 drop into both eyes 2 (two) times daily. 8 am and 1700, Disp: , Rfl:    Emollient (BAG BALM) OINT, Apply to feet twice daily (Patient taking differently: Apply 1 Application topically 2 (two) times daily. Apply to feet), Disp: 240 g, Rfl: 5   ferrous sulfate  325 (65 FE) MG tablet, Take 1 tablet every other day with breakfast, Disp: 90 tablet, Rfl: 1   glucose blood (ACCU-CHEK GUIDE TEST) test strip, Use as instructed, Disp: 100 each, Rfl: 12   glucose blood (ACCU-CHEK GUIDE) test strip, USE ONE STRIP TO TEST THREE TIMES A DAY, Disp: 100 strip, Rfl: 2   lidocaine  (LIDODERM ) 5 %, Place 1 patch onto the skin daily. Remove & Discard patch within 12 hours or as directed by MD (Patient taking differently: Place 1 patch onto the skin daily as needed (for pain).), Disp: 30 patch, Rfl: 0   loperamide  HCl (IMODIUM  A-D) 1 MG/7.5ML solution, Take 4 mg once, then 2 mg after each loose stool. Maximum 8 mg per day. (Patient not taking: Reported on 04/12/2023), Disp: 120 mL, Rfl: 0   Netarsudil -Latanoprost  (ROCKLATAN ) 0.02-0.005 % SOLN, Place 1 drop into both eyes every  evening., Disp: , Rfl:    pantoprazole  (PROTONIX ) 20 MG tablet, TAKE 1 TABLET BY MOUTH 2 TIMES A DAY, Disp: 180 tablet, Rfl: 2   Polyvinyl Alcohol -Povidone (REFRESH OP), Place 1 drop into both eyes daily as needed (dry eyes)., Disp: , Rfl:    SIMBRINZA 1-0.2 % SUSP, Place into both eyes in the morning and at bedtime., Disp: , Rfl:    sodium bicarbonate  650 MG tablet, Take 1 tablet (650 mg total) by mouth 2 (two) times daily., Disp: 100 tablet, Rfl: 1  Current Facility-Administered Medications:    methylPREDNISolone sodium succinate (SOLU-MEDROL) 125 mg/2 mL injection 125 mg, 125 mg, Intramuscular, Once,   Review of Systems:    Skin: Patient endorses rash  Physical  Exam:  Vitals:   05/31/23 0959  BP: (!) 142/80  Pulse: 74  SpO2: 100%  Weight: 212 lb 3.2 oz (96.3 kg)  Height: 5\' 8"  (1.727 m)   General: Patient is sitting comfortably in the room  Head: Normocephalic, atraumatic  Cardio: Regular rate and rhythm, no murmurs, rubs or gallops Pulmonary: Clear to ausculation bilaterally with no rales, rhonchi, and crackles  Skin: Patient has urticarial rash noted to lower back as well as front chest as well as bilateral thigh.  No ulceration or drainage appreciated.  No bleeding appreciated.   Assessment & Plan:   Allergic reaction Patient presents the clinic with concerns of an acute onset of rash.  This is a urticarial rash that started yesterday afternoon.  She reports she used a new [employer] cream and the rash started after that.  She denies any new soaps, detergents, foods, travel, hiking, gardening, or any other new activities in her life.  She states the rash is very itchy.  On exam it is on her back, front chest, and on her legs.  This is likely an allergic reaction.  No concern for anaphylaxis.  Will give patient steroids as well as recommend Zyrtec.  Given patient is diabetic, did let her know that sugars may run high over the next few days.  She understood this.  Plan: - Recommend Zyrtec over-the-counter - Recommend hydrocortisone cream over-the-counter - Solu-Medrol shot administered today in clinic  Patient discussed with Dr. Sonia Durand, DO PGY-2 Internal Medicine Resident

## 2023-05-31 NOTE — Telephone Encounter (Signed)
 Pt has been schedule today 5/14 @0945  Am with Dr Lydia Sams.

## 2023-06-02 DIAGNOSIS — E113593 Type 2 diabetes mellitus with proliferative diabetic retinopathy without macular edema, bilateral: Secondary | ICD-10-CM | POA: Diagnosis not present

## 2023-06-02 DIAGNOSIS — H3582 Retinal ischemia: Secondary | ICD-10-CM | POA: Diagnosis not present

## 2023-06-02 DIAGNOSIS — H4313 Vitreous hemorrhage, bilateral: Secondary | ICD-10-CM | POA: Diagnosis not present

## 2023-06-02 DIAGNOSIS — H35372 Puckering of macula, left eye: Secondary | ICD-10-CM | POA: Diagnosis not present

## 2023-06-02 DIAGNOSIS — H31093 Other chorioretinal scars, bilateral: Secondary | ICD-10-CM | POA: Diagnosis not present

## 2023-06-02 DIAGNOSIS — H43392 Other vitreous opacities, left eye: Secondary | ICD-10-CM | POA: Diagnosis not present

## 2023-06-02 DIAGNOSIS — H35033 Hypertensive retinopathy, bilateral: Secondary | ICD-10-CM | POA: Diagnosis not present

## 2023-06-02 DIAGNOSIS — H43813 Vitreous degeneration, bilateral: Secondary | ICD-10-CM | POA: Diagnosis not present

## 2023-06-04 ENCOUNTER — Other Ambulatory Visit: Payer: Self-pay | Admitting: Student

## 2023-06-05 ENCOUNTER — Encounter: Payer: Self-pay | Admitting: Student

## 2023-06-05 ENCOUNTER — Ambulatory Visit (INDEPENDENT_AMBULATORY_CARE_PROVIDER_SITE_OTHER): Payer: 59 | Admitting: Student

## 2023-06-05 VITALS — BP 142/67 | HR 90 | Temp 98.2°F | Ht 68.0 in | Wt 211.8 lb

## 2023-06-05 DIAGNOSIS — E0800 Diabetes mellitus due to underlying condition with hyperosmolarity without nonketotic hyperglycemic-hyperosmolar coma (NKHHC): Secondary | ICD-10-CM

## 2023-06-05 DIAGNOSIS — T7840XD Allergy, unspecified, subsequent encounter: Secondary | ICD-10-CM | POA: Diagnosis not present

## 2023-06-05 DIAGNOSIS — N183 Chronic kidney disease, stage 3 unspecified: Secondary | ICD-10-CM

## 2023-06-05 DIAGNOSIS — I129 Hypertensive chronic kidney disease with stage 1 through stage 4 chronic kidney disease, or unspecified chronic kidney disease: Secondary | ICD-10-CM | POA: Diagnosis not present

## 2023-06-05 DIAGNOSIS — E1122 Type 2 diabetes mellitus with diabetic chronic kidney disease: Secondary | ICD-10-CM | POA: Diagnosis not present

## 2023-06-05 DIAGNOSIS — E1159 Type 2 diabetes mellitus with other circulatory complications: Secondary | ICD-10-CM

## 2023-06-05 MED ORDER — AMLODIPINE BESYLATE 10 MG PO TABS: 10.0000 mg | ORAL_TABLET | Freq: Every day | ORAL | 11 refills | Status: DC

## 2023-06-05 MED ORDER — PREDNISONE 20 MG PO TABS
40.0000 mg | ORAL_TABLET | Freq: Every day | ORAL | 0 refills | Status: AC
Start: 2023-06-05 — End: 2023-06-09

## 2023-06-05 MED ORDER — FAMOTIDINE 20 MG PO TABS: 20.0000 mg | ORAL_TABLET | Freq: Two times a day (BID) | ORAL | 0 refills | Status: DC

## 2023-06-05 MED ORDER — SODIUM BICARBONATE 650 MG PO TABS: 1300.0000 mg | ORAL_TABLET | Freq: Two times a day (BID) | ORAL | 1 refills | Status: DC

## 2023-06-05 NOTE — Assessment & Plan Note (Signed)
 Patient presents to the clinic with elevated blood pressures.  She had elevated blood pressures previously as well.  Given renal function cannot start thiazide or ACE/ARB.  Will start calcium  channel blocker.  Plan: - Start amlodipine  10 mg daily

## 2023-06-05 NOTE — Telephone Encounter (Signed)
 Medication discontinued 04/12/23

## 2023-06-05 NOTE — Patient Instructions (Addendum)
 [name],Thank you for allowing me to take part in your care today.  Here are your instructions.  1.  Please start taking famotidine  20 mg daily.  Please take prednisone  40 mg daily for the next 4 days.  Please start taking amlodipine  for your blood pressure.  2. Please call the kidney doctor   [facility] center in [place], [name]    Address: [address] [place], [place] [address] Phone: [phone]  3.  Please come back in 1 week and we will address your rash again to make sure it is improving.  Thank you, Dr. Lydia Sams  If you have any other questions please contact the internal medicine clinic at [phone] If it is after hours, please call the [facility] at [phone] and then ask the person who picks up for the resident on call.

## 2023-06-05 NOTE — Progress Notes (Signed)
 CC: 10-month follow-up for medication refills  HPI:  [name] is a 68 y.o. female with a past medical history of hypertension, CKD stage IIIb, TIA who presents to the clinic for follow-up appointment.  Please see assessment and plan for full HPI.  Medications: TIA: Aspirin  81 mg daily, Lipitor 40 mg daily, Plavix  75 mg daily Iron deficiency: Ferrous sulfate  325 mg daily GERD: Protonix  20 mg twice daily CKD: Sodium bicarbonate  650 mg twice daily HTN: Amlodipine  10 mg daily  Allergic rash: Zyrtec   Past Medical History:  Diagnosis Date   Arthritis    Chronic normocytic anemia    CKD (chronic kidney disease) stage 3, GFR 30-59 ml/min (HCC) 02/02/2013   Diabetes mellitus type II, controlled (HCC) 06/17/2009   Diabetic ulcer of both feet (HCC) 01/07/2010   Qualifier: Diagnosis of  By: Evlyn Hoffmann MD, Amanjot     Essential hypertension 06/17/2009   GERD (gastroesophageal reflux disease)    Glaucoma    H/O hiatal hernia    Hardware complicating wound infection (HCC) 12/28/2020   Hyperlipidemia    MRSA (methicillin resistant Staphylococcus aureus)    Obstructive sleep apnea 05/26/2010   Polymicrobial bacterial infection 12/28/2020   Stroke [facility])      Current Outpatient Medications:    amLODipine  (NORVASC ) 10 MG tablet, Take 1 tablet (10 mg total) by mouth daily., Disp: 30 tablet, Rfl: 11   famotidine  (PEPCID ) 20 MG tablet, Take 1 tablet (20 mg total) by mouth 2 (two) times daily., Disp: 30 tablet, Rfl: 0   predniSONE  (DELTASONE ) 20 MG tablet, Take 2 tablets (40 mg total) by mouth daily with breakfast for 4 days., Disp: 8 tablet, Rfl: 0   Accu-Chek Softclix Lancets lancets, USE ONE LANCET TO TEST THREE TIMES A DAY, Disp: 100 each, Rfl: 12   acetaminophen  (TYLENOL ) 500 MG tablet, Take 500-1,000 mg by mouth every 6 (six) hours as needed for moderate pain or headache., Disp: , Rfl:    Ascorbic Acid  (VITAMIN C ) 1000 MG tablet, Take 1,000 mg by mouth daily. Rose hips, Disp: , Rfl:     aspirin  EC 81 MG tablet, Take 1 tablet (81 mg total) by mouth daily. Swallow whole., Disp: 30 tablet, Rfl: 11   atorvastatin  (LIPITOR) 40 MG tablet, TAKE 1 TABLET BY MOUTH DAILY, Disp: 90 tablet, Rfl: 3   Biotin  5000 MCG CAPS, Take 5,000 mcg by mouth daily., Disp: , Rfl:    Blood Glucose Monitoring Suppl (ACCU-CHEK AVIVA PLUS) w/Device KIT, Use to monitor blood sugars, Disp: 1 kit, Rfl: 0   Blood Pressure Monitoring (ADULT BLOOD PRESSURE CUFF LG) KIT, Use to monitor your BP daily and bring log during each visit, Disp: 1 kit, Rfl: 0   clopidogrel  (PLAVIX ) 75 MG tablet, Take 1 tablet (75 mg total) by mouth daily., Disp: 90 tablet, Rfl: 0   cycloSPORINE  (RESTASIS ) 0.05 % ophthalmic emulsion, Place 1 drop into both eyes 2 (two) times daily. 8 am and 1700, Disp: , Rfl:    Emollient (BAG BALM) OINT, Apply to feet twice daily (Patient taking differently: Apply 1 Application topically 2 (two) times daily. Apply to feet), Disp: 240 g, Rfl: 5   ferrous sulfate  325 (65 FE) MG tablet, Take 1 tablet every other day with breakfast, Disp: 90 tablet, Rfl: 1   glucose blood (ACCU-CHEK GUIDE TEST) test strip, Use as instructed, Disp: 100 each, Rfl: 12   glucose blood (ACCU-CHEK GUIDE) test strip, USE ONE STRIP TO TEST THREE TIMES A DAY, Disp: 100 strip,  Rfl: 2   Netarsudil -Latanoprost  (ROCKLATAN ) 0.02-0.005 % SOLN, Place 1 drop into both eyes every evening., Disp: , Rfl:    pantoprazole  (PROTONIX ) 20 MG tablet, TAKE 1 TABLET BY MOUTH 2 TIMES A DAY, Disp: 180 tablet, Rfl: 2   Polyvinyl Alcohol -Povidone (REFRESH OP), Place 1 drop into both eyes daily as needed (dry eyes)., Disp: , Rfl:    SIMBRINZA 1-0.2 % SUSP, Place into both eyes in the morning and at bedtime., Disp: , Rfl:    sodium bicarbonate  650 MG tablet, Take 2 tablets (1,300 mg total) by mouth 2 (two) times daily., Disp: 100 tablet, Rfl: 1  Review of Systems:    Skin: Patient endorses rash  Physical Exam:  Vitals:   06/05/23 0942  BP: (!) 142/67   Pulse: 90  Temp: 98.2 F (36.8 C)  TempSrc: Oral  SpO2: 100%  Weight: 211 lb 12.8 oz (96.1 kg)  Height: 5\' 8"  (1.727 m)   General: Patient is sitting comfortably in the room  ENT: Oropharyngeal area with no obstruction appreciated Cardio: Regular rate and rhythm, no murmurs, rubs or gallops Pulmonary: Clear to ausculation bilaterally with no rales, rhonchi, and crackles  Skin: Extensive maculopapular rash noted to back, abdomen, neck, extremities Extremities: 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  Healing well.  Assessment & Plan:   CKD stage 3 due to type 2 diabetes mellitus (HCC) Patient has progressed to CKD stage IV.  She is still not plugged in with nephrology.  Patient has been referred but patient has not called them.  I encouraged her to call them.  Given decreased renal function, will still need to hold lisinopril  and Jardiance .  Patient was taken off of lisinopril  and Jardiance  for AKI.  Plan: - Patient encouraged to call nephrology - Patient to continue to hold lisinopril  and Jardiance  - 1300 mg sodium bicarb BID   Hypertension associated with stage 3 chronic kidney disease due to type 2 diabetes mellitus (HCC) Patient presents to the clinic with elevated blood pressures.  She had elevated blood pressures previously as well.  Given renal function cannot start thiazide or ACE/ARB.  Will start calcium  channel blocker.  Plan: - Start amlodipine  10 mg daily  Diabetes mellitus (HCC) Patient's most recent hemoglobin A1c was 6.3 in March 2025.  Patient instructed to hold metformin  given kidney function.  A1c is at goal.  Plan: - Continue lifestyle modifications -Foot exam updated today  Allergic reaction Patient presents again with rash.  She endorses that it did slightly improve and the itching has improved slightly as well.  Otherwise, she endorses that it has not changed much.  On my exam, it is slightly more extensive.  Again  she stopped using her cream so I am unsure what is causing this rash.  Will give oral steroids.  Will also start famotidine .  Plan: - 4 days of prednisone  - Start famotidine  20 mg daily - Continue Zyrtec 10 mg daily - Follow-up in 1 week  Patient seen with Dr. Sonia Durand, DO PGY-2 Internal Medicine Resident

## 2023-06-05 NOTE — Progress Notes (Signed)
 Internal Medicine Clinic Attending  Case discussed with the resident at the time of the visit.  We reviewed the resident's history and exam and pertinent patient test results.  I agree with the assessment, diagnosis, and plan of care documented in the resident's note.

## 2023-06-05 NOTE — Assessment & Plan Note (Addendum)
 Patient's most recent hemoglobin A1c was 6.3 in March 2025.  Patient instructed to hold metformin  given kidney function.  A1c is at goal.  Plan: - Continue lifestyle modifications -Foot exam updated today

## 2023-06-05 NOTE — Assessment & Plan Note (Signed)
 Patient presents again with rash.  She endorses that it did slightly improve and the itching has improved slightly as well.  Otherwise, she endorses that it has not changed much.  On my exam, it is slightly more extensive.  Again she stopped using her cream so I am unsure what is causing this rash.  Will give oral steroids.  Will also start famotidine .  Plan: - 4 days of prednisone  - Start famotidine  20 mg daily - Continue Zyrtec 10 mg daily - Follow-up in 1 week

## 2023-06-05 NOTE — Assessment & Plan Note (Addendum)
 Patient has progressed to CKD stage IV.  She is still not plugged in with nephrology.  Patient has been referred but patient has not called them.  I encouraged her to call them.  Given decreased renal function, will still need to hold lisinopril  and Jardiance .  Patient was taken off of lisinopril  and Jardiance  for AKI.  Plan: - Patient encouraged to call nephrology - Patient to continue to hold lisinopril  and Jardiance  - 1300 mg sodium bicarb BID

## 2023-06-06 NOTE — Telephone Encounter (Unsigned)
 Referral has been placed and sent   Latest Notification   06/04/2499:28:15 PM Delivery Method  Clinical Attachment Action  Fax Generated PCP Type  -- Payer  -- Fax Number  [phone]    Copied from CRM [id number]. Topic: General - Other >> Jun 05, 2023  2:56 PM Adrianna P wrote: Reason for CRM:She called [facility] like Dr. Lydia Sams said, and they said patients cannot call in, and that she would need to be referral from her Dr.

## 2023-06-13 ENCOUNTER — Encounter: Payer: Self-pay | Admitting: Student

## 2023-06-13 ENCOUNTER — Ambulatory Visit: Admitting: Student

## 2023-06-13 VITALS — BP 126/75 | HR 81 | Temp 98.2°F | Ht 68.0 in | Wt 213.8 lb

## 2023-06-13 DIAGNOSIS — N183 Chronic kidney disease, stage 3 unspecified: Secondary | ICD-10-CM | POA: Diagnosis not present

## 2023-06-13 DIAGNOSIS — E1122 Type 2 diabetes mellitus with diabetic chronic kidney disease: Secondary | ICD-10-CM | POA: Diagnosis not present

## 2023-06-13 DIAGNOSIS — T7840XD Allergy, unspecified, subsequent encounter: Secondary | ICD-10-CM | POA: Diagnosis not present

## 2023-06-13 NOTE — Progress Notes (Signed)
 CC: Rash Follow up   HPI:  [name] is a 68 y.o. female with a past medical history of hypertension, CKD stage IIIb, TIA who presents to the clinic for follow-up appointment.  Please see assessment and plan for full HPI.   Medications: TIA: Aspirin  81 mg daily, Lipitor 40 mg daily, Plavix  75 mg daily Iron deficiency: Ferrous sulfate  325 mg daily GERD: Protonix  20 mg twice daily CKD: Sodium bicarbonate  650 mg twice daily HTN: Amlodipine  10 mg daily  Allergic rash: Zyrtec, famotidine  20 mg daily   Past Medical History:  Diagnosis Date   Arthritis    Chronic normocytic anemia    CKD (chronic kidney disease) stage 3, GFR 30-59 ml/min (HCC) 02/02/2013   Diabetes mellitus type II, controlled (HCC) 06/17/2009   Diabetic ulcer of both feet (HCC) 01/07/2010   Qualifier: Diagnosis of  By: Evlyn Hoffmann MD, Amanjot     Essential hypertension 06/17/2009   GERD (gastroesophageal reflux disease)    Glaucoma    H/O hiatal hernia    Hardware complicating wound infection (HCC) 12/28/2020   Hyperlipidemia    MRSA (methicillin resistant Staphylococcus aureus)    Obstructive sleep apnea 05/26/2010   Polymicrobial bacterial infection 12/28/2020   Stroke (HCC)      Current Outpatient Medications:    Accu-Chek Softclix Lancets lancets, USE ONE LANCET TO TEST THREE TIMES A DAY, Disp: 100 each, Rfl: 12   acetaminophen  (TYLENOL ) 500 MG tablet, Take 500-1,000 mg by mouth every 6 (six) hours as needed for moderate pain or headache., Disp: , Rfl:    amLODipine  (NORVASC ) 10 MG tablet, Take 1 tablet (10 mg total) by mouth daily., Disp: 30 tablet, Rfl: 11   Ascorbic Acid  (VITAMIN C ) 1000 MG tablet, Take 1,000 mg by mouth daily. Rose hips, Disp: , Rfl:    aspirin  EC 81 MG tablet, Take 1 tablet (81 mg total) by mouth daily. Swallow whole., Disp: 30 tablet, Rfl: 11   atorvastatin  (LIPITOR) 40 MG tablet, TAKE 1 TABLET BY MOUTH DAILY, Disp: 90 tablet, Rfl: 3   Biotin  5000 MCG CAPS, Take 5,000 mcg by  mouth daily., Disp: , Rfl:    Blood Glucose Monitoring Suppl (ACCU-CHEK AVIVA PLUS) w/Device KIT, Use to monitor blood sugars, Disp: 1 kit, Rfl: 0   Blood Pressure Monitoring (ADULT BLOOD PRESSURE CUFF LG) KIT, Use to monitor your BP daily and bring log during each visit, Disp: 1 kit, Rfl: 0   clopidogrel  (PLAVIX ) 75 MG tablet, Take 1 tablet (75 mg total) by mouth daily., Disp: 90 tablet, Rfl: 0   cycloSPORINE  (RESTASIS ) 0.05 % ophthalmic emulsion, Place 1 drop into both eyes 2 (two) times daily. 8 am and 1700, Disp: , Rfl:    Emollient (BAG BALM) OINT, Apply to feet twice daily (Patient taking differently: Apply 1 Application topically 2 (two) times daily. Apply to feet), Disp: 240 g, Rfl: 5   famotidine  (PEPCID ) 20 MG tablet, Take 1 tablet (20 mg total) by mouth 2 (two) times daily., Disp: 30 tablet, Rfl: 0   ferrous sulfate  325 (65 FE) MG tablet, Take 1 tablet every other day with breakfast, Disp: 90 tablet, Rfl: 1   glucose blood (ACCU-CHEK GUIDE TEST) test strip, Use as instructed, Disp: 100 each, Rfl: 12   glucose blood (ACCU-CHEK GUIDE) test strip, USE ONE STRIP TO TEST THREE TIMES A DAY, Disp: 100 strip, Rfl: 2   Netarsudil -Latanoprost  (ROCKLATAN ) 0.02-0.005 % SOLN, Place 1 drop into both eyes every evening., Disp: , Rfl:  pantoprazole  (PROTONIX ) 20 MG tablet, TAKE 1 TABLET BY MOUTH 2 TIMES A DAY, Disp: 180 tablet, Rfl: 2   Polyvinyl Alcohol -Povidone (REFRESH OP), Place 1 drop into both eyes daily as needed (dry eyes)., Disp: , Rfl:    SIMBRINZA 1-0.2 % SUSP, Place into both eyes in the morning and at bedtime., Disp: , Rfl:    sodium bicarbonate  650 MG tablet, Take 2 tablets (1,300 mg total) by mouth 2 (two) times daily., Disp: 100 tablet, Rfl: 1  Review of Systems:   Negative except for what is stated in HPI  Physical Exam:  Vitals:   06/13/23 1003 06/13/23 1023  BP: (!) 144/80 126/75  Pulse: 90 81  Temp: 98.2 F (36.8 C)   TempSrc: Oral   SpO2: 97%   Weight: 213 lb 12.8 oz  (97 kg)   Height: 5\' 8"  (1.727 m)    General: Patient is sitting comfortably in the room  Head: Normocephalic, atraumatic  Cardio: Regular rate and rhythm, no murmurs, rubs or gallops Pulmonary: Clear to ausculation bilaterally with no rales, rhonchi, and crackles  Skin: No significant rashes appreciated to bilateral arms, legs, or back   Assessment & Plan:   Allergic reaction Patient presents for 1 week follow-up.  Patient has been followed for allergic reaction.  At previous visit, patient had worsening rash.  Patient had started famotidine  along with Zyrtec as well as prednisone .  She presents today for follow-up appointment.  Rash is completely gone now.  She denies any further issues.  Her itching has improved tremendously.  She has no concerns about this at all today.  On my exam, her rash has improved tremendously from before.  She has stayed away from her [employer] cream.  I do not know if this was the true cause but I told her to stay away from it.  Plan: - Continue to monitor - Continue famotidine  20 mg daily - Continue Zyrtec 10 mg daily  CKD stage 3 due to type 2 diabetes mellitus (HCC) Likely has progressive CKD stage IV.  I talked to her about this today.  She reports that she finally has an appointment on June 30, 2023 with nephrology.  Plan: - Continue sodium bicarb 1300 mg twice daily - Keep nephrology appointment on June 30, 2023  Patient discussed with Dr. Teola Felling, DO PGY-2 Internal Medicine Resident

## 2023-06-13 NOTE — Assessment & Plan Note (Addendum)
 Patient presents for 1 week follow-up.  Patient has been followed for allergic reaction.  At previous visit, patient had worsening rash.  Patient had started famotidine  along with Zyrtec as well as prednisone .  She presents today for follow-up appointment.  Rash is completely gone now.  She denies any further issues.  Her itching has improved tremendously.  She has no concerns about this at all today.  On my exam, her rash has improved tremendously from before.  She has stayed away from her [employer] cream.  I do not know if this was the true cause but I told her to stay away from it.  Plan: - Continue to monitor - Continue famotidine  20 mg daily - Continue Zyrtec 10 mg daily

## 2023-06-13 NOTE — Assessment & Plan Note (Signed)
 Likely has progressive CKD stage IV.  I talked to her about this today.  She reports that she finally has an appointment on June 30, 2023 with nephrology.  Plan: - Continue sodium bicarb 1300 mg twice daily - Keep nephrology appointment on June 30, 2023

## 2023-06-13 NOTE — Patient Instructions (Addendum)
 [name],Thank you for allowing me to take part in your care today.  Here are your instructions.  1. I am glad that the rash is better. Please go to your kidney doctor appointment.   Thank you, Dr. Lydia Sams  If you have any other questions please contact the internal medicine clinic at [phone] If it is after hours, please call the [place] [facility] at [phone] and then ask the person who picks up for the resident on call.

## 2023-06-19 DIAGNOSIS — H3582 Retinal ischemia: Secondary | ICD-10-CM | POA: Diagnosis not present

## 2023-06-19 DIAGNOSIS — H4313 Vitreous hemorrhage, bilateral: Secondary | ICD-10-CM | POA: Diagnosis not present

## 2023-06-19 DIAGNOSIS — E113593 Type 2 diabetes mellitus with proliferative diabetic retinopathy without macular edema, bilateral: Secondary | ICD-10-CM | POA: Diagnosis not present

## 2023-06-19 DIAGNOSIS — H35033 Hypertensive retinopathy, bilateral: Secondary | ICD-10-CM | POA: Diagnosis not present

## 2023-06-19 DIAGNOSIS — H35372 Puckering of macula, left eye: Secondary | ICD-10-CM | POA: Diagnosis not present

## 2023-06-19 DIAGNOSIS — H31093 Other chorioretinal scars, bilateral: Secondary | ICD-10-CM | POA: Diagnosis not present

## 2023-06-19 DIAGNOSIS — H43813 Vitreous degeneration, bilateral: Secondary | ICD-10-CM | POA: Diagnosis not present

## 2023-06-19 NOTE — Progress Notes (Signed)
 Internal Medicine Clinic Attending  Case discussed with the resident at the time of the visit.  We reviewed the resident's history and exam and pertinent patient test results.  I agree with the assessment, diagnosis, and plan of care documented in the resident's note.

## 2023-06-28 ENCOUNTER — Telehealth: Payer: Self-pay

## 2023-06-28 NOTE — Telephone Encounter (Signed)
 Patient was identified as falling into the [place] Measure - Diabetes.   Patient was: Appointment already scheduled for:  6/27@10 :45.

## 2023-06-30 DIAGNOSIS — D631 Anemia in chronic kidney disease: Secondary | ICD-10-CM | POA: Diagnosis not present

## 2023-06-30 DIAGNOSIS — E1122 Type 2 diabetes mellitus with diabetic chronic kidney disease: Secondary | ICD-10-CM | POA: Diagnosis not present

## 2023-06-30 DIAGNOSIS — N184 Chronic kidney disease, stage 4 (severe): Secondary | ICD-10-CM | POA: Diagnosis not present

## 2023-06-30 DIAGNOSIS — I129 Hypertensive chronic kidney disease with stage 1 through stage 4 chronic kidney disease, or unspecified chronic kidney disease: Secondary | ICD-10-CM | POA: Diagnosis not present

## 2023-06-30 DIAGNOSIS — N189 Chronic kidney disease, unspecified: Secondary | ICD-10-CM | POA: Diagnosis not present

## 2023-07-01 LAB — LAB REPORT - SCANNED
Albumin, Urine POC: 5.1
Creatinine, POC: 38 mg/dL
EGFR: 21
Microalb Creat Ratio: 13

## 2023-07-04 ENCOUNTER — Other Ambulatory Visit: Payer: Self-pay

## 2023-07-04 MED ORDER — FAMOTIDINE 20 MG PO TABS: 20.0000 mg | ORAL_TABLET | Freq: Two times a day (BID) | ORAL | 0 refills | Status: DC

## 2023-07-14 ENCOUNTER — Encounter: Payer: Self-pay | Admitting: Student

## 2023-07-14 ENCOUNTER — Ambulatory Visit: Admitting: Student

## 2023-07-14 VITALS — BP 116/56 | HR 71 | Temp 97.8°F | Ht 68.0 in | Wt 211.8 lb

## 2023-07-14 DIAGNOSIS — E1122 Type 2 diabetes mellitus with diabetic chronic kidney disease: Secondary | ICD-10-CM

## 2023-07-14 DIAGNOSIS — E1169 Type 2 diabetes mellitus with other specified complication: Secondary | ICD-10-CM

## 2023-07-14 DIAGNOSIS — I129 Hypertensive chronic kidney disease with stage 1 through stage 4 chronic kidney disease, or unspecified chronic kidney disease: Secondary | ICD-10-CM

## 2023-07-14 DIAGNOSIS — N184 Chronic kidney disease, stage 4 (severe): Secondary | ICD-10-CM

## 2023-07-14 LAB — POCT GLYCOSYLATED HEMOGLOBIN (HGB A1C): Hemoglobin A1C: 6.3 % — AB (ref 4.0–5.6)

## 2023-07-14 LAB — GLUCOSE, CAPILLARY: Glucose-Capillary: 141 mg/dL — ABNORMAL HIGH (ref 70–99)

## 2023-07-14 NOTE — Progress Notes (Signed)
 CC: T2DM f/u  HPI:  [name] is a 68 y.o. female living with a history stated below and presents today for T2DM f/u. Please see problem based assessment and plan for additional details.  Medications: TIA: Aspirin  81 mg daily, Lipitor 40 mg daily, Plavix  75 mg daily Iron deficiency: Ferrous sulfate  325 mg daily GERD: Protonix  20 mg twice daily CKD: Sodium bicarbonate  650 mg twice daily HTN: Amlodipine  10 mg daily  Allergic rash: Zyrtec, famotidine  20 mg daily  Past Medical History:  Diagnosis Date   Arthritis    Chronic normocytic anemia    CKD (chronic kidney disease) stage 3, GFR 30-59 ml/min (HCC) 02/02/2013   Diabetes mellitus type II, controlled (HCC) 06/17/2009   Diabetic ulcer of both feet (HCC) 01/07/2010   Qualifier: Diagnosis of  By: Arvella MD, Amanjot     Essential hypertension 06/17/2009   GERD (gastroesophageal reflux disease)    Glaucoma    H/O hiatal hernia    Hardware complicating wound infection (HCC) 12/28/2020   Hyperlipidemia    MRSA (methicillin resistant Staphylococcus aureus)    Obstructive sleep apnea 05/26/2010   Polymicrobial bacterial infection 12/28/2020   Stroke [facility])     Current Outpatient Medications on File Prior to Visit  Medication Sig Dispense Refill   Accu-Chek Softclix Lancets lancets USE ONE LANCET TO TEST THREE TIMES A DAY 100 each 12   acetaminophen  (TYLENOL ) 500 MG tablet Take 500-1,000 mg by mouth every 6 (six) hours as needed for moderate pain or headache.     amLODipine  (NORVASC ) 10 MG tablet Take 1 tablet (10 mg total) by mouth daily. 30 tablet 11   Ascorbic Acid  (VITAMIN C ) 1000 MG tablet Take 1,000 mg by mouth daily. Rose hips     aspirin  EC 81 MG tablet Take 1 tablet (81 mg total) by mouth daily. Swallow whole. 30 tablet 11   atorvastatin  (LIPITOR) 40 MG tablet TAKE 1 TABLET BY MOUTH DAILY 90 tablet 3   Biotin  5000 MCG CAPS Take 5,000 mcg by mouth daily.     Blood Glucose Monitoring Suppl (ACCU-CHEK AVIVA PLUS)  w/Device KIT Use to monitor blood sugars 1 kit 0   Blood Pressure Monitoring (ADULT BLOOD PRESSURE CUFF LG) KIT Use to monitor your BP daily and bring log during each visit 1 kit 0   clopidogrel  (PLAVIX ) 75 MG tablet Take 1 tablet (75 mg total) by mouth daily. 90 tablet 0   cycloSPORINE  (RESTASIS ) 0.05 % ophthalmic emulsion Place 1 drop into both eyes 2 (two) times daily. 8 am and 1700     Emollient (BAG BALM) OINT Apply to feet twice daily (Patient taking differently: Apply 1 Application topically 2 (two) times daily. Apply to feet) 240 g 5   famotidine  (PEPCID ) 20 MG tablet Take 1 tablet (20 mg total) by mouth 2 (two) times daily. 30 tablet 0   ferrous sulfate  325 (65 FE) MG tablet Take 1 tablet every other day with breakfast 90 tablet 1   glucose blood (ACCU-CHEK GUIDE TEST) test strip Use as instructed 100 each 12   glucose blood (ACCU-CHEK GUIDE) test strip USE ONE STRIP TO TEST THREE TIMES A DAY 100 strip 2   Netarsudil -Latanoprost  (ROCKLATAN ) 0.02-0.005 % SOLN Place 1 drop into both eyes every evening.     pantoprazole  (PROTONIX ) 20 MG tablet TAKE 1 TABLET BY MOUTH 2 TIMES A DAY 180 tablet 2   Polyvinyl Alcohol -Povidone (REFRESH OP) Place 1 drop into both eyes daily as needed (dry eyes).  SIMBRINZA 1-0.2 % SUSP Place into both eyes in the morning and at bedtime.     sodium bicarbonate  650 MG tablet Take 2 tablets (1,300 mg total) by mouth 2 (two) times daily. 100 tablet 1   [DISCONTINUED] pravastatin  (PRAVACHOL ) 20 MG tablet Take 1 tablet (20 mg total) by mouth daily. 30 tablet 6   No current facility-administered medications on file prior to visit.    Family History  Problem Relation Age of Onset   Diabetes Mother    Hypertension Mother    Dementia Mother    Heart disease Father    Stroke Father    Diabetes Father    Hypertension Father    Gout Father    Deep vein thrombosis Father    Breast cancer Cousin    Breast cancer Cousin     Social History   Socioeconomic  History   Marital status: Single    Spouse name: Not on file   Number of children: Not on file   Years of education: Not on file   Highest education level: Not on file  Occupational History   Occupation: Disabled  Tobacco Use   Smoking status: Former    Current packs/day: 0.00    Types: Cigarettes    Start date: 12/22/1972    Quit date: 12/22/1972    Years since quitting: 50.5   Smokeless tobacco: Never  Vaping Use   Vaping status: Never Used  Substance and Sexual Activity   Alcohol  use: No   Drug use: No   Sexual activity: Not Currently  Other Topics Concern   Not on file  Social History Narrative   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   Smoked when she was a teenager   no alcohol    no Drugs      Current Social History 10/26/2018        Patient lives on the main level of a 3 floor boarding house with 3 other tenants. There are 3 steps with handrails up to the entrance the patient uses.       Patient's method of transportation is personal car.      The highest level of education was Bachelor's Degree.      The patient currently disabled.      Identified important Relationships are My cousins, Avelina Finder and Lorraine Pepper, and my dear friend Heron Oman.      Pets : None       Interests / Fun: I love to read, write, photography: go around [place] taking photos of the [address], Pop[employer]Art, Art Museums, cooking, hanging out with friends, several ministries with my Church, especially working with the youth.      Current Stressors: Worrying about someone to care for me if anything happens to me. Being closed in (2/2 Covid)       Religious / Personal Beliefs: Protestant ([employer])       L. [place]atte, BSN, RN-BC          Social Drivers of Health   Financial Resource Strain: Low Risk  (10/26/2022)   Overall Financial Resource Strain (CARDIA)    Difficulty of Paying Living Expenses: Not very hard   Food Insecurity: No Food Insecurity (04/12/2023)   Hunger Vital Sign    Worried About Running Out of Food in the Last Year: Never true    Ran Out of Food in the Last Year: Never true  Transportation Needs: No Transportation Needs (04/13/2023)  PRAPARE - Administrator, Civil Service (Medical): No    Lack of Transportation (Non-Medical): No  Physical Activity: Inactive (10/26/2022)   Exercise Vital Sign    Days of Exercise per Week: 0 days    Minutes of Exercise per Session: 0 min  Stress: No Stress Concern Present (10/26/2022)   [employer] of Occupational Health - Occupational Stress Questionnaire    Feeling of Stress : Only a little  Social Connections: Moderately Integrated (10/26/2022)   Social Connection and Isolation Panel    Frequency of Communication with Friends and Family: More than three times a week    Frequency of Social Gatherings with Friends and Family: Three times a week    Attends Religious Services: More than 4 times per year    Active Member of Clubs or Organizations: Yes    Attends Banker Meetings: More than 4 times per year    Marital Status: Never married  Intimate Partner Violence: Not At Risk (04/13/2023)   Humiliation, Afraid, Rape, and Kick questionnaire    Fear of Current or Ex-Partner: No    Emotionally Abused: No    Physically Abused: No    Sexually Abused: No    Review of Systems: ROS negative except for what is noted on the assessment and plan.  Vitals:   07/14/23 1041  BP: (!) 116/56  Pulse: 71  Temp: 97.8 F (36.6 C)  TempSrc: Oral  SpO2: 100%  Weight: 211 lb 12.8 oz (96.1 kg)  Height: 5' 8 (1.727 m)   Physical Exam: Constitutional: well-appearing female sitting up comfortably, in no acute distress Cardiovascular: regular rate  Pulmonary/Chest: normal work of breathing on room air, lungs clear to auscultation bilaterally Neurological: alert & oriented x 3 Psych: normal mood and affect  Assessment &  Plan:   CKD stage 4 due to type 2 diabetes mellitus (HCC) Progression to CKD IV from prior OV. Follows with Dr. Melia (nephrology). Last seen by [facility] this month, no acute changes, states was told stable, take oral iron supplementation. On sodium bicarb 1300 mg BID. Patient states not on EPO. Last BMP 04/2023 Scr 2.64 GFR 19. Had repeat labs at June nephrology visit, pending notes.   Hypertension associated with stage 4 chronic kidney disease due to type 2 diabetes mellitus (HCC) Status: controlled. BP today 116/56. Reports taking amlodipine  10 mg daily (started in 05/2023). CKD 4 following with nephrology, last OV this month. Tolerating well, no lightheaded or dizziness, no acute concerns today.   Plan -Continue: amlodipine    Diabetes mellitus (HCC) Status: controlled. Last A1c of 6.3 in 03/2023.  A1c today is 6.3.  Currently taking no medication.   Plan -Continue lifestyle modifications -A1c in 6 months -Ophthalmology exam: completed 10/2022 -Urine ACR normal 08/2022 -LDL 63 on 03/2023, taking atorvastatin  40 mg    Patient discussed with Dr. Francesco Ozell Nearing, D.O. [facility] Internal Medicine, PGY-2 Phone: [phone] Date 07/14/2023 Time 11:30 AM

## 2023-07-14 NOTE — Progress Notes (Signed)
 Internal Medicine Clinic Attending  Case discussed with the resident at the time of the visit.  We reviewed the resident's history and exam and pertinent patient test results.  I agree with the assessment, diagnosis, and plan of care documented in the resident's note.

## 2023-07-14 NOTE — Patient Instructions (Signed)
 Thank you, [name] for allowing us  to provide your care today. Today we discussed:  -Blood pressure doing great! Continue with amlodipine .  -A1c is stable at 6.3%. Doing great! -Continue following with your nephrologist    Follow up: 3-4 months    Should you have any questions or concerns please call the internal medicine clinic at [phone].    Angelissa Supan, D.O. [facility]

## 2023-07-14 NOTE — Assessment & Plan Note (Signed)
 Status: controlled. BP today 116/56. Reports taking amlodipine  10 mg daily (started in 05/2023). CKD 4 following with nephrology, last OV this month. Tolerating well, no lightheaded or dizziness, no acute concerns today.   Plan -Continue: amlodipine 

## 2023-07-14 NOTE — Assessment & Plan Note (Addendum)
 Progression to CKD IV from prior OV. Follows with Dr. Melia (nephrology). Last seen by [facility] this month, no acute changes, states was told stable, take oral iron supplementation. On sodium bicarb 1300 mg BID. Patient states not on EPO. Last BMP 04/2023 Scr 2.64 GFR 19. Had repeat labs at June nephrology visit, pending notes.

## 2023-07-14 NOTE — Assessment & Plan Note (Signed)
 Status: controlled. Last A1c of 6.3 in 03/2023.  A1c today is 6.3.  Currently taking no medication.   Plan -Continue lifestyle modifications -A1c in 6 months -Ophthalmology exam: completed 10/2022 -Urine ACR normal 08/2022 -LDL 63 on 03/2023, taking atorvastatin  40 mg

## 2023-07-15 ENCOUNTER — Other Ambulatory Visit: Payer: Self-pay | Admitting: Student

## 2023-07-17 NOTE — Telephone Encounter (Signed)
 Medication last refilled 07/04/23

## 2023-07-29 ENCOUNTER — Other Ambulatory Visit: Payer: Self-pay | Admitting: Student

## 2023-07-31 NOTE — Telephone Encounter (Signed)
 Continue famotidine  20 mg daily per OV or BID? Sending to PCP.

## 2023-08-13 ENCOUNTER — Other Ambulatory Visit: Payer: Self-pay | Admitting: Student

## 2023-08-17 DIAGNOSIS — E119 Type 2 diabetes mellitus without complications: Secondary | ICD-10-CM | POA: Diagnosis not present

## 2023-08-23 DIAGNOSIS — M14671 Charcot's joint, right ankle and foot: Secondary | ICD-10-CM | POA: Diagnosis not present

## 2023-08-26 ENCOUNTER — Other Ambulatory Visit: Payer: Self-pay | Admitting: Student

## 2023-09-10 ENCOUNTER — Other Ambulatory Visit: Payer: Self-pay | Admitting: Student

## 2023-09-21 DIAGNOSIS — H3582 Retinal ischemia: Secondary | ICD-10-CM | POA: Diagnosis not present

## 2023-09-21 DIAGNOSIS — H35033 Hypertensive retinopathy, bilateral: Secondary | ICD-10-CM | POA: Diagnosis not present

## 2023-09-21 DIAGNOSIS — E113593 Type 2 diabetes mellitus with proliferative diabetic retinopathy without macular edema, bilateral: Secondary | ICD-10-CM | POA: Diagnosis not present

## 2023-09-21 DIAGNOSIS — H43813 Vitreous degeneration, bilateral: Secondary | ICD-10-CM | POA: Diagnosis not present

## 2023-09-21 DIAGNOSIS — H35372 Puckering of macula, left eye: Secondary | ICD-10-CM | POA: Diagnosis not present

## 2023-09-21 DIAGNOSIS — H31093 Other chorioretinal scars, bilateral: Secondary | ICD-10-CM | POA: Diagnosis not present

## 2023-09-21 DIAGNOSIS — H4311 Vitreous hemorrhage, right eye: Secondary | ICD-10-CM | POA: Diagnosis not present

## 2023-09-21 LAB — HM DIABETES EYE EXAM

## 2023-09-23 ENCOUNTER — Other Ambulatory Visit: Payer: Self-pay | Admitting: Student

## 2023-09-25 NOTE — Telephone Encounter (Signed)
 Medication expired off of med list.

## 2023-10-13 ENCOUNTER — Encounter: Admitting: Student

## 2023-10-13 ENCOUNTER — Ambulatory Visit: Payer: Self-pay | Admitting: Student

## 2023-10-19 DIAGNOSIS — N184 Chronic kidney disease, stage 4 (severe): Secondary | ICD-10-CM | POA: Diagnosis not present

## 2023-10-25 DIAGNOSIS — N184 Chronic kidney disease, stage 4 (severe): Secondary | ICD-10-CM | POA: Diagnosis not present

## 2023-10-25 DIAGNOSIS — N189 Chronic kidney disease, unspecified: Secondary | ICD-10-CM | POA: Diagnosis not present

## 2023-10-25 DIAGNOSIS — I129 Hypertensive chronic kidney disease with stage 1 through stage 4 chronic kidney disease, or unspecified chronic kidney disease: Secondary | ICD-10-CM | POA: Diagnosis not present

## 2023-10-25 DIAGNOSIS — D631 Anemia in chronic kidney disease: Secondary | ICD-10-CM | POA: Diagnosis not present

## 2023-10-25 DIAGNOSIS — E1122 Type 2 diabetes mellitus with diabetic chronic kidney disease: Secondary | ICD-10-CM | POA: Diagnosis not present

## 2023-10-26 ENCOUNTER — Encounter: Payer: Self-pay | Admitting: Student

## 2023-10-26 NOTE — Progress Notes (Addendum)
 Pharmacy Quality Measure Review  This patient is appearing on a report for being at risk of failing the adherence measure for hypertension (ACEi/ARB) medications this calendar year.   Medication: Lisinopril  20mg  Last fill date: 04/04/23 for 90 day supply  This patient is appearing on a report for being at risk of failing the adherence measure for diabetes medications this calendar year.   Medication: Jardiance  10mg  Last fill date: 03/21 for 60 day supply  Insurance report was not up to date. No action needed at this time. Lisinopril  discontinued after 04/12/23 discharge from hospital admission for TIA. Jardiance  likely was as well, although documentation is unclear.   Many changes to her CKD regimen occurred during this stay, Jardiance  was not mentioned in discharge summary report. Believe she is no longer taking it from chart review.  Spoke with patient to confirm this, and she is unsure. Recommended she discuss this at her upcoming primary care visit and re-start the discussion of whether she is indicated for Jardiance  at this time or not.   She also believes she should be taking sodium bicarbonate  three times daily per her last nephrology visit. Notified her to request a TID script from her PCP at her upcoming appointment. All patient questions were answered.  Angela Baalmann, PharmD [facility] Pharmacist

## 2023-10-30 ENCOUNTER — Ambulatory Visit: Admitting: Student

## 2023-10-30 ENCOUNTER — Other Ambulatory Visit: Payer: Self-pay | Admitting: Internal Medicine

## 2023-10-30 ENCOUNTER — Other Ambulatory Visit: Payer: Self-pay | Admitting: Student

## 2023-10-30 ENCOUNTER — Encounter: Payer: Self-pay | Admitting: Student

## 2023-10-30 VITALS — BP 137/81 | HR 59 | Temp 97.4°F | Ht 68.0 in | Wt 216.8 lb

## 2023-10-30 DIAGNOSIS — Z87891 Personal history of nicotine dependence: Secondary | ICD-10-CM | POA: Diagnosis not present

## 2023-10-30 DIAGNOSIS — E1169 Type 2 diabetes mellitus with other specified complication: Secondary | ICD-10-CM | POA: Diagnosis not present

## 2023-10-30 DIAGNOSIS — Z1231 Encounter for screening mammogram for malignant neoplasm of breast: Secondary | ICD-10-CM

## 2023-10-30 DIAGNOSIS — E1122 Type 2 diabetes mellitus with diabetic chronic kidney disease: Secondary | ICD-10-CM

## 2023-10-30 DIAGNOSIS — Z23 Encounter for immunization: Secondary | ICD-10-CM | POA: Diagnosis not present

## 2023-10-30 DIAGNOSIS — I129 Hypertensive chronic kidney disease with stage 1 through stage 4 chronic kidney disease, or unspecified chronic kidney disease: Secondary | ICD-10-CM | POA: Diagnosis not present

## 2023-10-30 DIAGNOSIS — N184 Chronic kidney disease, stage 4 (severe): Secondary | ICD-10-CM | POA: Diagnosis not present

## 2023-10-30 DIAGNOSIS — Z79899 Other long term (current) drug therapy: Secondary | ICD-10-CM | POA: Diagnosis not present

## 2023-10-30 LAB — POCT GLYCOSYLATED HEMOGLOBIN (HGB A1C): HbA1c, POC (controlled diabetic range): 7 % (ref 0.0–7.0)

## 2023-10-30 LAB — GLUCOSE, CAPILLARY: Glucose-Capillary: 122 mg/dL — ABNORMAL HIGH (ref 70–99)

## 2023-10-30 MED ORDER — ATORVASTATIN CALCIUM 80 MG PO TABS
80.0000 mg | ORAL_TABLET | Freq: Every day | ORAL | 3 refills | Status: AC
Start: 2023-10-30 — End: ?

## 2023-10-30 MED ORDER — AMLODIPINE BESYLATE 10 MG PO TABS: 10.0000 mg | ORAL_TABLET | Freq: Every day | ORAL | 3 refills | Status: AC

## 2023-10-30 MED ORDER — SODIUM BICARBONATE 650 MG PO TABS: 1300.0000 mg | ORAL_TABLET | Freq: Three times a day (TID) | ORAL | 3 refills | Status: AC

## 2023-10-30 NOTE — Progress Notes (Addendum)
 CC: T2DM  HPI: [name] is a 68 y.o. female living with a history stated below and presents today for T2DM. Please see problem based assessment and plan for additional details.  Medications: TIA: Aspirin  81 mg daily, Lipitor 40 mg daily Iron deficiency: Ferrous sulfate  325 mg daily GERD: Protonix  20 mg twice daily CKD: Sodium bicarbonate  650 mg 3x daily HTN: Amlodipine  10 mg daily  Allergic rash: Zyrtec, famotidine  20 mg daily  Past Medical History:  Diagnosis Date   Arthritis    Chronic normocytic anemia    CKD (chronic kidney disease) stage 3, GFR 30-59 ml/min (HCC) 02/02/2013   Diabetes mellitus type II, controlled (HCC) 06/17/2009   Diabetic ulcer of both feet (HCC) 01/07/2010   Qualifier: Diagnosis of  By: Arvella MD, Amanjot     Essential hypertension 06/17/2009   GERD (gastroesophageal reflux disease)    Glaucoma    H/O hiatal hernia    Hardware complicating wound infection 12/28/2020   Hyperlipidemia    MRSA (methicillin resistant Staphylococcus aureus)    Obstructive sleep apnea 05/26/2010   Polymicrobial bacterial infection 12/28/2020   Stroke [facility])     Current Outpatient Medications on File Prior to Visit  Medication Sig Dispense Refill   Accu-Chek Softclix Lancets lancets USE ONE LANCET TO TEST THREE TIMES A DAY 100 each 12   acetaminophen  (TYLENOL ) 500 MG tablet Take 500-1,000 mg by mouth every 6 (six) hours as needed for moderate pain or headache.     Ascorbic Acid  (VITAMIN C ) 1000 MG tablet Take 1,000 mg by mouth daily. Rose hips     aspirin  EC 81 MG tablet Take 1 tablet (81 mg total) by mouth daily. Swallow whole. 30 tablet 11   atorvastatin  (LIPITOR) 40 MG tablet TAKE 1 TABLET BY MOUTH DAILY 90 tablet 3   Biotin  5000 MCG CAPS Take 5,000 mcg by mouth daily.     Blood Glucose Monitoring Suppl (ACCU-CHEK AVIVA PLUS) w/Device KIT Use to monitor blood sugars 1 kit 0   Blood Pressure Monitoring (ADULT BLOOD PRESSURE CUFF LG) KIT Use to monitor your BP  daily and bring log during each visit 1 kit 0   cycloSPORINE  (RESTASIS ) 0.05 % ophthalmic emulsion Place 1 drop into both eyes 2 (two) times daily. 8 am and 1700     Emollient (BAG BALM) OINT Apply to feet twice daily (Patient taking differently: Apply 1 Application topically 2 (two) times daily. Apply to feet) 240 g 5   famotidine  (PEPCID ) 20 MG tablet TAKE 1 TABLET BY MOUTH DAILY 90 tablet 2   ferrous sulfate  325 (65 FE) MG tablet Take 1 tablet every other day with breakfast 90 tablet 1   glucose blood (ACCU-CHEK GUIDE TEST) test strip Use as instructed 100 each 12   Netarsudil -Latanoprost  (ROCKLATAN ) 0.02-0.005 % SOLN Place 1 drop into both eyes every evening.     pantoprazole  (PROTONIX ) 20 MG tablet TAKE 1 TABLET BY MOUTH 2 TIMES A DAY 180 tablet 2   Polyvinyl Alcohol -Povidone (REFRESH OP) Place 1 drop into both eyes daily as needed (dry eyes).     SIMBRINZA 1-0.2 % SUSP Place into both eyes in the morning and at bedtime.     No current facility-administered medications on file prior to visit.    Family History  Problem Relation Age of Onset   Diabetes Mother    Hypertension Mother    Dementia Mother    Heart disease Father    Stroke Father    Diabetes Father  Hypertension Father    Gout Father    Deep vein thrombosis Father    Breast cancer Cousin    Breast cancer Cousin     Social History   Socioeconomic History   Marital status: Single    Spouse name: Not on file   Number of children: Not on file   Years of education: Not on file   Highest education level: Not on file  Occupational History   Occupation: Disabled  Tobacco Use   Smoking status: Former    Current packs/day: 0.00    Types: Cigarettes    Start date: 12/22/1972    Quit date: 12/22/1972    Years since quitting: 50.8   Smokeless tobacco: Never  Vaping Use   Vaping status: Never Used  Substance and Sexual Activity   Alcohol  use: No   Drug use: No   Sexual activity: Not Currently  Other Topics  Concern   Not on file  Social History Narrative   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   Smoked when she was a teenager   no alcohol    no Drugs      Current Social History 10/26/2018        Patient lives on the main level of a 3 floor boarding house with 3 other tenants. There are 3 steps with handrails up to the entrance the patient uses.       Patient's method of transportation is personal car.      The highest level of education was Bachelor's Degree.      The patient currently disabled.      Identified important Relationships are My cousins, Avelina Finder and Lorraine Pepper, and my dear friend Heron Oman.      Pets : None       Interests / Fun: I love to read, write, photography: go around [place] taking photos of the [address], [employer], Art Museums, cooking, hanging out with friends, several ministries with my Church, especially working with the youth.      Current Stressors: Worrying about someone to care for me if anything happens to me. Being closed in (2/2 Covid)       Religious / Personal Beliefs: Protestant ([employer])       L. Ducatte, BSN, RN-BC          Social Drivers of Health   Financial Resource Strain: Low Risk  (10/26/2022)   Overall Financial Resource Strain (CARDIA)    Difficulty of Paying Living Expenses: Not very hard  Food Insecurity: No Food Insecurity (04/12/2023)   Hunger Vital Sign    Worried About Running Out of Food in the Last Year: Never true    Ran Out of Food in the Last Year: Never true  Transportation Needs: No Transportation Needs (04/13/2023)   PRAPARE - Administrator, Civil Service (Medical): No    Lack of Transportation (Non-Medical): No  Physical Activity: Inactive (10/26/2022)   Exercise Vital Sign    Days of Exercise per Week: 0 days    Minutes of Exercise per Session: 0 min  Stress: No Stress Concern Present (10/26/2022)   [employer] of  Occupational Health - Occupational Stress Questionnaire    Feeling of Stress : Only a little  Social Connections: Moderately Integrated (10/26/2022)   Social Connection and Isolation Panel    Frequency of Communication with Friends and Family: More than three times a week    Frequency of  Social Gatherings with Friends and Family: Three times a week    Attends Religious Services: More than 4 times per year    Active Member of Clubs or Organizations: Yes    Attends Banker Meetings: More than 4 times per year    Marital Status: Never married  Intimate Partner Violence: Not At Risk (04/13/2023)   Humiliation, Afraid, Rape, and Kick questionnaire    Fear of Current or Ex-Partner: No    Emotionally Abused: No    Physically Abused: No    Sexually Abused: No    Review of Systems: ROS negative except for what is noted on the assessment and plan.  Vitals:   10/30/23 1047 10/30/23 1119  BP: (!) 153/66 137/81  Pulse: 60 (!) 59  Temp: (!) 97.4 F (36.3 C)   TempSrc: Oral   SpO2: 100%   Weight: 216 lb 12.8 oz (98.3 kg)   Height: 5' 8 (1.727 m)    Physical Exam: Constitutional: Alert, sitting in chair comfortably, in no acute distress Cardiovascular: regular rate and rhythm Pulmonary/Chest: normal work of breathing on room air Neurological: alert & oriented x 3  Assessment & Plan:   Assessment & Plan Type 2 diabetes mellitus with other specified complication, without long-term current use of insulin  (HCC) Status: controlled. Last A1c of 6.3 in 06/2023.  A1c today 7.0%. Currently taking no medication. No GLP-1 in setting of suspected gastroparesis leading to bowel obstruction and prior hospitalization. Was on Jardiance  before, now CKD4 follows with nephrology. Last GFR is right at 21 in 06/2023 but reports recent lab done at nephrology visit this month. Will discuss with Dr. Melia w/ nephrology about SGLT2 option.    Plan -Continue lifestyle modifications -Discuss w/ nephrology  about SGLT2 option  -A1c in 3 months -Ophthalmology exam: completed 09/2023 -Urine ACR completed 06/2023 -LDL 63 on 03/2023, taking atorvastatin  40 mg, discussion on increase or adding on Zetia to further lower LDL, patient elected increase dose so sent atorvastatin  80 mg Hypertension associated with stage 4 chronic kidney disease due to type 2 diabetes mellitus (HCC) BP today 137/81 on repeat.  Taking amlodipine  10 mg daily.  Follows with nephrology with recent visit this month.  Denies any acute concerns.  Will continue amlodipine , refill sent today. CKD stage 4 due to type 2 diabetes mellitus (HCC) Reports recent visit with nephrology this month.  Completed labs at that time.  Remains on sodium bicarb, refill sent today.  Discussion of possibly adding back SGLT2 but will discuss with nephrology first.  Last GFR of 21 in 06/2023.  ADDENDUM: Discussed w/ Dr. Melia (nephrology), comfortable to resume Jardiance  w/ stable CKD4 (GFR 20). Discussed w/ patient and agreeable to restart it. Jardiance  10 mg sent to [facility] pharmacy.  Encounter for immunization Received flu shot today.   Orders Placed This Encounter  Procedures   Flu vaccine HIGH DOSE PF(Fluzone Trivalent)   Glucose, capillary   POC Hbg A1C    Return in about 3 months (around 01/30/2024) for diabetes.   Patient discussed with Dr. Francesco Ozell Nearing, D.O. [facility], PGY-3 Phone: [phone] Date 10/30/2023 Time 1:08 PM

## 2023-10-30 NOTE — Assessment & Plan Note (Addendum)
 Status: controlled. Last A1c of 6.3 in 06/2023.  A1c today 7.0%. Currently taking no medication. No GLP-1 in setting of suspected gastroparesis leading to bowel obstruction and prior hospitalization. Was on Jardiance  before, now CKD4 follows with nephrology. Last GFR is right at 21 in 06/2023 but reports recent lab done at nephrology visit this month. Will discuss with Dr. Melia w/ nephrology about SGLT2 option.    Plan -Continue lifestyle modifications -Discuss w/ nephrology about SGLT2 option  -A1c in 3 months -Ophthalmology exam: completed 09/2023 -Urine ACR completed 06/2023 -LDL 63 on 03/2023, taking atorvastatin  40 mg, discussion on increase or adding on Zetia to further lower LDL, patient elected increase dose so sent atorvastatin  80 mg

## 2023-10-30 NOTE — Assessment & Plan Note (Addendum)
 BP today 137/81 on repeat.  Taking amlodipine  10 mg daily.  Follows with nephrology with recent visit this month.  Denies any acute concerns.  Will continue amlodipine , refill sent today.

## 2023-10-30 NOTE — Patient Instructions (Addendum)
 Thank you, [name] for allowing us  to provide your care today. Today we discussed:  -A1c 7.0%. Will check about options for diabetes and kidney disease.  -Continue to work on healthy eating habits and staying physically active.  -Flu shot today -Medication refills sent -We decided to increase atorvastatin  to 80 mg daily. Monitor for side effects, call our office if you experience any.    Follow up: 3-4 months   Should you have any questions or concerns please call the internal medicine clinic at [phone].    Bricia Taher, D.O. [facility]

## 2023-10-30 NOTE — Assessment & Plan Note (Addendum)
 Reports recent visit with nephrology this month.  Completed labs at that time.  Remains on sodium bicarb, refill sent today.  Discussion of possibly adding back SGLT2 but will discuss with nephrology first.  Last GFR of 21 in 06/2023.  ADDENDUM: Discussed w/ Dr. Melia (nephrology), comfortable to resume Jardiance  w/ stable CKD4 (GFR 20). Discussed w/ patient and agreeable to restart it. Jardiance  10 mg sent to [facility] pharmacy.

## 2023-11-01 ENCOUNTER — Ambulatory Visit (INDEPENDENT_AMBULATORY_CARE_PROVIDER_SITE_OTHER): Payer: 59

## 2023-11-01 VITALS — Ht 68.0 in | Wt 216.0 lb

## 2023-11-01 DIAGNOSIS — Z0000 Encounter for general adult medical examination without abnormal findings: Secondary | ICD-10-CM | POA: Diagnosis not present

## 2023-11-01 NOTE — Patient Instructions (Signed)
 Ms. Dehaan,  Thank you for taking the time for your Medicare Wellness Visit. I appreciate your continued commitment to your health goals. Please review the care plan we discussed, and feel free to reach out if I can assist you further.  Medicare recommends these wellness visits once per year to help you and your care team stay ahead of potential health issues. These visits are designed to focus on prevention, allowing your provider to concentrate on managing your acute and chronic conditions during your regular appointments.  Please note that Annual Wellness Visits do not include a physical exam. Some assessments may be limited, especially if the visit was conducted virtually. If needed, we may recommend a separate in-person follow-up with your provider.  Ongoing Care Seeing your primary care provider every 3 to 6 months helps us  monitor your health and provide consistent, personalized care.   Referrals If a referral was made during today's visit and you haven't received any updates within two weeks, please contact the referred provider directly to check on the status.  Recommended Screenings:  Health Maintenance  Topic Date Due   Zoster (Shingles) Vaccine (2 of 2) 12/05/2020   Hemoglobin A1C  01/30/2024   Lipid (cholesterol) test  04/11/2024   COVID-19 Vaccine (9 - 2025-26 season) 04/20/2024   Complete foot exam   06/04/2024   Yearly kidney function blood test for diabetes  06/30/2024   Yearly kidney health urinalysis for diabetes  06/30/2024   Eye exam for diabetics  09/20/2024   Medicare Annual Wellness Visit  10/31/2024   Breast Cancer Screening  11/28/2024   DTaP/Tdap/Td vaccine (3 - Td or Tdap) 03/19/2031   Colon Cancer Screening  10/25/2032   Pneumococcal Vaccine for age over 48  Completed   Flu Shot  Completed   DEXA scan (bone density measurement)  Completed   Hepatitis C Screening  Completed   Meningitis B Vaccine  Aged Out   Stool Blood Test  Discontinued        11/01/2023   10:48 AM  Advanced Directives  Does Patient Have a Medical Advance Directive? No  Would patient like information on creating a medical advance directive? No - Patient declined   Advance Care Planning is important because it: Ensures you receive medical care that aligns with your values, goals, and preferences. Provides guidance to your family and loved ones, reducing the emotional burden of decision-making during critical moments.  Vision: Annual vision screenings are recommended for early detection of glaucoma, cataracts, and diabetic retinopathy. These exams can also reveal signs of chronic conditions such as diabetes and high blood pressure.  Dental: Annual dental screenings help detect early signs of oral cancer, gum disease, and other conditions linked to overall health, including heart disease and diabetes.  Please see the attached documents for additional preventive care recommendations.

## 2023-11-01 NOTE — Progress Notes (Addendum)
 Because this visit was a virtual/telehealth visit,  certain criteria was not obtained, such a blood pressure, CBG if applicable, and timed get up and go. Any medications not marked as taking were not mentioned during the medication reconciliation part of the visit. Any vitals not documented were not able to be obtained due to this being a telehealth visit or patient was unable to self-report a recent blood pressure reading due to a lack of equipment at home via telehealth. Vitals that have been documented are verbally provided by the patient.   Subjective:   [name] is a 68 y.o. who presents for a Medicare Wellness preventive visit.  As a reminder, Annual Wellness Visits don't include a physical exam, and some assessments may be limited, especially if this visit is performed virtually. We may recommend an in-person follow-up visit with your provider if needed.  Visit Complete: Virtual I connected with  Hoy JINNY Budge on 11/01/2023 by a audio enabled telemedicine application and verified that I am speaking with the correct person using two identifiers.   Patient Location: Home  Provider Location: Home Office  I discussed the limitations of evaluation and management by telemedicine. The patient expressed understanding and agreed to proceed.  Vital Signs: Because this visit was a virtual/telehealth visit, some criteria may be missing or patient reported. Any vitals not documented were not able to be obtained and vitals that have been documented are patient reported.  VideoDeclined- This patient declined Librarian, academic. Therefore the visit was completed with audio only.  Persons Participating in Visit: Patient.  AWV Questionnaire: No: Patient Medicare AWV questionnaire was not completed prior to this visit.  Cardiac Risk Factors include: advanced age (>14men, >76 women);hypertension;diabetes mellitus;dyslipidemia;obesity (BMI >30kg/m2);family  history of premature cardiovascular disease     Objective:    Today's Vitals   11/01/23 1033  Weight: 216 lb (98 kg)  Height: 5' 8 (1.727 m)  PainSc: 0-No pain   Body mass index is 32.84 kg/m.     11/01/2023   10:48 AM 05/11/2023    6:06 AM 04/12/2023    7:56 PM 12/07/2022   12:21 AM 10/26/2022   10:43 AM 09/15/2022   11:09 AM 03/07/2022    2:36 PM  Advanced Directives  Does Patient Have a Medical Advance Directive? No No No No Yes No Yes  Type of Agricultural consultant;Living will  Living will;Healthcare Power of Attorney  Does patient want to make changes to medical advance directive?    No - Patient declined No - Patient declined  No - Patient declined  Copy of Healthcare Power of Attorney in Chart?     Yes - validated most recent copy scanned in chart (See row information)  Yes - validated most recent copy scanned in chart (See row information)  Would patient like information on creating a medical advance directive? No - Patient declined No - Patient declined No - Patient declined No - Patient declined No - Patient declined No - Patient declined     Current Medications (verified) Outpatient Encounter Medications as of 11/01/2023  Medication Sig   Accu-Chek Softclix Lancets lancets USE ONE LANCET TO TEST THREE TIMES A DAY   acetaminophen  (TYLENOL ) 500 MG tablet Take 500-1,000 mg by mouth every 6 (six) hours as needed for moderate pain or headache.   amLODipine  (NORVASC ) 10 MG tablet Take 1 tablet (10 mg total) by mouth daily.   Ascorbic Acid  (VITAMIN C )  1000 MG tablet Take 1,000 mg by mouth daily. Rose hips   aspirin  EC 81 MG tablet Take 1 tablet (81 mg total) by mouth daily. Swallow whole.   atorvastatin  (LIPITOR) 80 MG tablet Take 1 tablet (80 mg total) by mouth daily.   Biotin  5000 MCG CAPS Take 5,000 mcg by mouth daily.   Blood Glucose Monitoring Suppl (ACCU-CHEK AVIVA PLUS) w/Device KIT Use to monitor blood sugars   Blood Pressure Monitoring  (ADULT BLOOD PRESSURE CUFF LG) KIT Use to monitor your BP daily and bring log during each visit   cycloSPORINE  (RESTASIS ) 0.05 % ophthalmic emulsion Place 1 drop into both eyes 2 (two) times daily. 8 am and 1700   Emollient (BAG BALM) OINT Apply to feet twice daily (Patient taking differently: Apply 1 Application topically 2 (two) times daily. Apply to feet)   famotidine  (PEPCID ) 20 MG tablet TAKE 1 TABLET BY MOUTH DAILY   ferrous sulfate  325 (65 FE) MG tablet Take 1 tablet every other day with breakfast   glucose blood (ACCU-CHEK GUIDE TEST) test strip Use as instructed   Netarsudil -Latanoprost  (ROCKLATAN ) 0.02-0.005 % SOLN Place 1 drop into both eyes every evening.   pantoprazole  (PROTONIX ) 20 MG tablet TAKE 1 TABLET BY MOUTH 2 TIMES A DAY   Polyvinyl Alcohol -Povidone (REFRESH OP) Place 1 drop into both eyes daily as needed (dry eyes).   SIMBRINZA 1-0.2 % SUSP Place into both eyes in the morning and at bedtime.   sodium bicarbonate  650 MG tablet Take 2 tablets (1,300 mg total) by mouth 3 (three) times daily.   No facility-administered encounter medications on file as of 11/01/2023.    Allergies (verified) Codeine, Pravastatin , and Latex   History: Past Medical History:  Diagnosis Date   Arthritis    Chronic normocytic anemia    CKD (chronic kidney disease) stage 3, GFR 30-59 ml/min (HCC) 02/02/2013   Diabetes mellitus type II, controlled (HCC) 06/17/2009   Diabetic ulcer of both feet (HCC) 01/07/2010   Qualifier: Diagnosis of  By: Arvella MD, Amanjot     Essential hypertension 06/17/2009   GERD (gastroesophageal reflux disease)    Glaucoma    H/O hiatal hernia    Hardware complicating wound infection 12/28/2020   Hyperlipidemia    MRSA (methicillin resistant Staphylococcus aureus)    Obstructive sleep apnea 05/26/2010   Polymicrobial bacterial infection 12/28/2020   Stroke [facility])    Past Surgical History:  Procedure Laterality Date   ACHILLES TENDON LENGTHENING Left 11/06/2014    Procedure: LEFT ACHILLES TENDON LENGTHENING, ;  Surgeon: Norleen Armor, MD;  Location: [facility] OR;  Service: Orthopedics;  Laterality: Left;   AMPUTATION TOE Left 05/11/2023   Procedure: LEFT SECOND TOE AMPUTATION AT [facility] INTERPHALANGEAL JOINT;  Surgeon: Armor Norleen, MD;  Location: [facility] OR;  Service: Orthopedics;  Laterality: Left;   APPENDECTOMY  1975   FOOT ARTHRODESIS Left 11/06/2014   Procedure: TALONAVICULAR CANCELLOUS CUBOID ARTHRODESIS WITH CORRECTIVE OSTEOTOMY ;  Surgeon: Norleen Armor, MD;  Location: [facility] OR;  Service: Orthopedics;  Laterality: Left;   FOOT ARTHRODESIS Right 05/14/2020   Procedure: Right heelcord lengthening, subtalar and talonavicular arthrodesis, talus exostectomy, talus corrective osteotomy;  Surgeon: Armor Norleen, MD;  Location: [facility] OR;  Service: Orthopedics;  Laterality: Right;   HARDWARE REMOVAL Left 02/19/2015   Procedure: REMOVAL OF LEFT FOOT EXTERNAL FIXATOR, CASTING OF LEFT ANKLE ;  Surgeon: Norleen Armor, MD;  Location: [facility];  Service: Orthopedics;  Laterality: Left;   HARDWARE REMOVAL Right 12/17/2020   Procedure:  Removal of deep implant right foot;  Surgeon: Kit Rush, MD;  Location: [facility] OR;  Service: Orthopedics;  Laterality: Right;   I & D EXTREMITY Left 2011   ulcer on my foot got infected (01/30/2013)   I & D EXTREMITY Right 05/06/2021   Procedure: Irrigation and excisional debridement of right ankle abcess;  Surgeon: Kit Rush, MD;  Location: [facility];  Service: Orthopedics;  Laterality: Right;    MULTIPLE TOOTH EXTRACTIONS Bilateral    OOPHORECTOMY Bilateral 1975-1976   Family History  Problem Relation Age of Onset   Diabetes Mother    Hypertension Mother    Dementia Mother    Heart disease Father    Stroke Father    Diabetes Father    Hypertension Father    Gout Father    Deep vein thrombosis Father    Breast cancer Cousin    Breast cancer Cousin    Social History   Socioeconomic History   Marital  status: Single    Spouse name: Not on file   Number of children: Not on file   Years of education: Not on file   Highest education level: Not on file  Occupational History   Occupation: Disabled  Tobacco Use   Smoking status: Former    Current packs/day: 0.00    Types: Cigarettes    Start date: 12/22/1972    Quit date: 12/22/1972    Years since quitting: 50.9   Smokeless tobacco: Never  Vaping Use   Vaping status: Never Used  Substance and Sexual Activity   Alcohol  use: No   Drug use: No   Sexual activity: Not Currently  Other Topics Concern   Not on file  Social History Narrative   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   Smoked when she was a teenager   no alcohol    no Drugs      Current Social History 10/26/2018        Patient lives on the main level of a 3 floor boarding house with 3 other tenants. There are 3 steps with handrails up to the entrance the patient uses.       Patient's method of transportation is personal car.      The highest level of education was Bachelor's Degree.      The patient currently disabled.      Identified important Relationships are My cousins, Avelina Finder and Lorraine Pepper, and my dear friend Heron Oman.      Pets : None       Interests / Fun: I love to read, write, photography: go around [place] taking photos of the [address], Pop-Art, Art Museums, cooking, hanging out with friends, several ministries with my Church, especially working with the youth.      Current Stressors: Worrying about someone to care for me if anything happens to me. Being closed in (2/2 Covid)       Religious / Personal Beliefs: Protestant ([employer])       L. Ducatte, BSN, RN-BC          Social Drivers of Health   Financial Resource Strain: Low Risk  (11/01/2023)   Overall Financial Resource Strain (CARDIA)    Difficulty of Paying Living Expenses: Not hard at all  Food Insecurity: No  Food Insecurity (11/01/2023)   Hunger Vital Sign    Worried About Running Out of Food in the Last Year: Never true  Ran Out of Food in the Last Year: Never true  Transportation Needs: No Transportation Needs (11/01/2023)   PRAPARE - Administrator, Civil Service (Medical): No    Lack of Transportation (Non-Medical): No  Physical Activity: Sufficiently Active (11/01/2023)   Exercise Vital Sign    Days of Exercise per Week: 7 days    Minutes of Exercise per Session: 30 min  Stress: No Stress Concern Present (11/01/2023)   [employer] of Occupational Health - Occupational Stress Questionnaire    Feeling of Stress: Not at all  Social Connections: Moderately Integrated (11/01/2023)   Social Connection and Isolation Panel    Frequency of Communication with Friends and Family: More than three times a week    Frequency of Social Gatherings with Friends and Family: Three times a week    Attends Religious Services: More than 4 times per year    Active Member of Clubs or Organizations: Yes    Attends Engineer, structural: More than 4 times per year    Marital Status: Never married    Tobacco Counseling Counseling given: Not Answered    Clinical Intake:  Pre-visit preparation completed: Yes  Pain : No/denies pain Pain Score: 0-No pain     BMI - recorded: 32.84 Nutritional Status: BMI > 30  Obese Nutritional Risks: None Diabetes: Yes CBG done?: No Did pt. bring in CBG monitor from home?: No  Lab Results  Component Value Date   HGBA1C 7.0 10/30/2023   HGBA1C 6.3 (A) 07/14/2023   HGBA1C 6.3 (H) 04/12/2023     How often do you need to have someone help you when you read instructions, pamphlets, or other written materials from your doctor or pharmacy?: 1 - Never What is the last grade level you completed in school?: B[employer]HEL[employer]S DEGREE  Interpreter Needed?: No  Information entered by :: Dina Warbington N. Mychael Soots, LPN.   Activities of Daily Living      11/01/2023   10:41 AM 05/11/2023    6:03 AM  In your present state of health, do you have any difficulty performing the following activities:  Hearing? 0   Vision? 0   Difficulty concentrating or making decisions? 0   Walking or climbing stairs? 1   Dressing or bathing? 0   Doing errands, shopping? 0 0  Preparing Food and eating ? N   Using the Toilet? N   In the past six months, have you accidently leaked urine? N   Do you have problems with loss of bowel control? N   Managing your Medications? N   Managing your Finances? N   Housekeeping or managing your Housekeeping? N     Patient Care Team: Tobie Gaines, DO as PCP - General Raj Rankin SAUNDERS, MD as Referring Physician (Ophthalmology) Aniceto Eva Grebe, PA-C as Physician Assistant (Physician Assistant) Kit Rush, MD as Consulting Physician (Orthopedic Surgery) Abigail Maude POUR as Consulting Physician (Optometry) Burnette Fallow, MD as Consulting Physician (Gastroenterology) Melia Lynwood ORN, MD as Attending Physician (Nephrology)  I have updated your Care Teams any recent Medical Services you may have received from other providers in the past year.     Assessment:   This is a routine wellness examination for Hall.  Hearing/Vision screen Hearing Screening - Comments:: Denies hearing difficulties.  Vision Screening - Comments:: Wears reading glasses - up to date with routine eye exams with Dr. Rankin Raj (retina specialist) and Dr. Maude Abigail.    Goals Addressed  This Visit's Progress    11/01/2023:       To lose weight. Reduce my cholesterol by eating right, increasing water intake and walking more.       Depression Screen     11/01/2023   10:44 AM 06/13/2023   10:04 AM 04/20/2023   11:15 AM 03/06/2023    9:13 AM 10/26/2022   10:51 AM 09/15/2022   11:12 AM 02/07/2022    4:33 PM  PHQ 2/9 Scores  PHQ - 2 Score 0 0 0 0 0 0 1  PHQ- 9 Score 0 0   2 5 2     Fall Risk     11/01/2023   10:40 AM  06/13/2023   10:04 AM 04/20/2023   11:15 AM 03/06/2023    9:12 AM 10/26/2022   10:43 AM  Fall Risk   Falls in the past year? 0 0 1 1 0  Number falls in past yr: 0 0 0 0 0  Injury with Fall? 0 0 0 0 0  Risk for fall due to : No Fall Risks No Fall Risks Impaired balance/gait Impaired balance/gait No Fall Risks  Follow up Falls evaluation completed Falls evaluation completed;Falls prevention discussed  Falls evaluation completed Falls prevention discussed;Falls evaluation completed    MEDICARE RISK AT HOME:  Medicare Risk at Home Any stairs in or around the home?: Yes (3 STEPS FRONT ENTRANCE) If so, are there any without handrails?: No Home free of loose throw rugs in walkways, pet beds, electrical cords, etc?: Yes Adequate lighting in your home to reduce risk of falls?: Yes Life alert?: No Use of a cane, walker or w/c?: Yes (ROLLATOR WALKER) Grab bars in the bathroom?: No Shower chair or bench in shower?: Yes Elevated toilet seat or a handicapped toilet?: Yes  TIMED UP AND GO:  Was the test performed?  No  Cognitive Function: Declined/Normal: No cognitive concerns noted by patient or family. Patient alert, oriented, able to answer questions appropriately and recall recent events. No signs of memory loss or confusion.    11/01/2023   10:44 AM  MMSE - Mini Mental State Exam  Not completed: Unable to complete        11/01/2023   10:35 AM 10/26/2022   10:44 AM 11/08/2021    2:01 PM  6CIT Screen  What Year? 0 points 0 points 0 points  What month? 0 points 0 points 0 points  What time? 0 points 0 points 0 points  Count back from 20 0 points 0 points 0 points  Months in reverse 0 points 0 points 0 points  Repeat phrase 0 points 0 points 0 points  Total Score 0 points 0 points 0 points    Immunizations Immunization History  Administered Date(s) Administered   Fluad Quad(high Dose 65+) 12/24/2020, 11/08/2021, 10/30/2023   Fluad Trivalent(High Dose 65+) 09/15/2022   INFLUENZA,  HIGH DOSE SEASONAL PF 10/30/2023   Influenza Split 12/24/2010, 10/07/2011   Influenza Whole 09/24/2009   Influenza,inj,Quad PF,6+ Mos 10/10/2012, 09/11/2013, 10/02/2014, 12/24/2015, 01/06/2017, 10/11/2017, 12/03/2018, 12/17/2019   Influenza-Unspecified 12/29/2016   PFIZER(Purple Top)SARS-COV-2 Vaccination 08/30/2019, 09/11/2019, 09/11/2019, 03/10/2020, 08/06/2020   PNEUMOCOCCAL CONJUGATE-20 03/04/2021, 10/02/2022   PPD Test 11/24/2014   Pfizer Covid-19 Vaccine Bivalent Booster 88yrs & up 11/05/2020   Pfizer(Comirnaty)Fall Seasonal Vaccine 12 years and older 10/02/2022, 10/21/2023   Pneumococcal Conjugate-13 01/01/2015   Pneumococcal Polysaccharide-23 12/29/2009, 07/21/2016   Td 12/29/2009   Tdap 03/18/2021   Zoster Recombinant(Shingrix) 10/10/2020   Zoster, Live 01/26/2016  Screening Tests Health Maintenance  Topic Date Due   Zoster Vaccines- Shingrix (2 of 2) 12/05/2020   HEMOGLOBIN A1C  01/30/2024   LIPID PANEL  04/11/2024   COVID-19 Vaccine (9 - Pfizer risk 2025-26 season) 04/20/2024   FOOT EXAM  06/04/2024   Diabetic kidney evaluation - eGFR measurement  06/30/2024   Diabetic kidney evaluation - Urine ACR  06/30/2024   OPHTHALMOLOGY EXAM  09/20/2024   Medicare Annual Wellness (AWV)  10/31/2024   Mammogram  11/28/2024   DTaP/Tdap/Td (3 - Td or Tdap) 03/19/2031   Colonoscopy  10/25/2032   Pneumococcal Vaccine: 50+ Years  Completed   Influenza Vaccine  Completed   DEXA SCAN  Completed   Hepatitis C Screening  Completed   Meningococcal B Vaccine  Aged Out   COLON CANCER SCREENING ANNUAL FOBT  Discontinued    Health Maintenance Items Addressed: Yes Vaccines Due: Shingrix (second dose)  Additional Screening:  Vision Screening: Recommended annual ophthalmology exams for early detection of glaucoma and other disorders of the eye. Is the patient up to date with their annual eye exam?  Yes  Who is the provider or what is the name of the office in which the patient attends  annual eye exams? Maude Bring, OD and Rankin Burke, MD.  Dental Screening: Recommended annual dental exams for proper oral hygiene  Community Resource Referral / Chronic Care Management: CRR required this visit?  No   CCM required this visit?  No   Plan:    I have personally reviewed and noted the following in the patient's chart:   Medical and social history Use of alcohol , tobacco or illicit drugs  Current medications and supplements including opioid prescriptions. Patient is not currently taking opioid prescriptions. Functional ability and status Nutritional status Physical activity Advanced directives List of other physicians Hospitalizations, surgeries, and ER visits in previous 12 months Vitals Screenings to include cognitive, depression, and falls Referrals and appointments  In addition, I have reviewed and discussed with patient certain preventive protocols, quality metrics, and best practice recommendations. A written personalized care plan for preventive services as well as general preventive health recommendations were provided to patient.   Roz LOISE Fuller, LPN   [id number]/77/[id number]   After Visit Summary: (MyChart) Due to this being a telephonic visit, the after visit summary with patients personalized plan was offered to patient via MyChart   Notes: Nothing significant to report at this time.  Internal Medicine Attending:  I reviewed the AWV findings of the medical professional who conducted the visit. I was present in the office suite and immediately available to provide assistance and direction throughout the time the service was provided.

## 2023-11-01 NOTE — Progress Notes (Signed)
 Internal Medicine Clinic Attending  Case discussed with the resident at the time of the visit.  We reviewed the resident's history and exam and pertinent patient test results.  I agree with the assessment, diagnosis, and plan of care documented in the resident's note.

## 2023-11-08 MED ORDER — EMPAGLIFLOZIN 10 MG PO TABS: 10.0000 mg | ORAL_TABLET | Freq: Every day | ORAL | 3 refills | Status: AC

## 2023-11-08 NOTE — Addendum Note (Signed)
 Addended by: ELICIA SHARPER on: 11/08/2023 11:17 AM   Modules accepted: Orders

## 2023-11-16 ENCOUNTER — Other Ambulatory Visit: Payer: Self-pay | Admitting: Student

## 2023-11-16 DIAGNOSIS — K219 Gastro-esophageal reflux disease without esophagitis: Secondary | ICD-10-CM

## 2023-11-16 NOTE — Telephone Encounter (Signed)
 Medication sent to pharmacy

## 2023-12-05 ENCOUNTER — Ambulatory Visit
Admission: RE | Admit: 2023-12-05 | Discharge: 2023-12-05 | Disposition: A | Source: Ambulatory Visit | Attending: Internal Medicine | Admitting: Internal Medicine

## 2023-12-05 DIAGNOSIS — Z1231 Encounter for screening mammogram for malignant neoplasm of breast: Secondary | ICD-10-CM

## 2023-12-07 ENCOUNTER — Telehealth: Payer: Self-pay | Admitting: *Deleted

## 2023-12-07 NOTE — Telephone Encounter (Signed)
 Copied from CRM #[id number]. Topic: Clinical - Order For Equipment >> Dec 07, 2023  1:19 PM [name] wrote: Reason for CRM: Patient states that she needs a new script for the supplies for her Cpap machine. She contacted [facility] and was told that she needed to reach out to PCP for the script. Patient did not have [facility] Fax # just their phone # @ [phone] if they need to be contacted

## 2023-12-28 ENCOUNTER — Ambulatory Visit: Payer: Self-pay

## 2023-12-28 VITALS — BP 133/61 | HR 73 | Temp 97.6°F | Ht 68.0 in | Wt 221.2 lb

## 2023-12-28 DIAGNOSIS — Z823 Family history of stroke: Secondary | ICD-10-CM | POA: Diagnosis not present

## 2023-12-28 DIAGNOSIS — G4733 Obstructive sleep apnea (adult) (pediatric): Secondary | ICD-10-CM

## 2023-12-28 DIAGNOSIS — Z87891 Personal history of nicotine dependence: Secondary | ICD-10-CM

## 2023-12-28 DIAGNOSIS — Z8673 Personal history of transient ischemic attack (TIA), and cerebral infarction without residual deficits: Secondary | ICD-10-CM | POA: Diagnosis not present

## 2023-12-28 NOTE — Patient Instructions (Addendum)
 Thank you, Ms.Hoy JINNY Budge for allowing us  to provide your care today. Today we discussed the following:  - Great job with everything! - I have provided a new script for the CPAP supplies, please let me know if they tell you they need anything else on our end  - Go ahead and get your RSV vaccine from the pharmacy at your convenience  Follow up: 5 weeks for A1c recheck    Should you have any questions or concerns please call the Internal Medicine Clinic at [phone].     Doyal Miyamoto, MD [facility]

## 2023-12-28 NOTE — Progress Notes (Signed)
 Patient name: [name] Date of birth: 18-Nov-1955 Date of visit: 12/31/2023  Type of visit: Established Patient Office Visit   Subjective   Chief concern:  Chief Complaint  Patient presents with   DME     Requesting Rx for Cap supplies.     [name] is a 68 y.o. female with a history of DMII, CKD4, HTN, prior stroke, OSA,  who presents to [facility] clinic requesting a new prescription for CPAP supplies.   She reports good adherence to her CPAP for OSA. She does need a new mask and new hose. She is requesting a prescription for this.   ROS: Denies headaches, dizziness, fever, chills, runny nose, sore throat, vision changes, hearing changes, chest pain, shortness of breath, difficulty breathing, nausea, vomiting, abdominal pain. Denies increased urinary frequency, pain with urination, constipation or diarrhea. No recent falls.   Patient Active Problem List   Diagnosis Date Noted   TIA (transient ischemic attack) 04/12/2023   Esophageal dysmotility 12/13/2022   Osteopenia after menopause 12/13/2022   Class 1 obesity with serious comorbidity and body mass index (BMI) of 31.0 to 31.9 in adult 05/18/2017   Hyperlipidemia 11/12/2014   Charcot foot due to diabetes mellitus (HCC) 11/06/2014   CKD stage 4 due to type 2 diabetes mellitus (HCC) 02/02/2013   GERD (gastroesophageal reflux disease) 03/09/2012   Healthcare maintenance 05/26/2010   Obstructive sleep apnea 05/26/2010   Iron deficiency anemia 01/12/2010   Diabetes mellitus (HCC) 06/17/2009   Hypertension associated with stage 4 chronic kidney disease due to type 2 diabetes mellitus (HCC) 06/17/2009     Past Surgical History:  Procedure Laterality Date   ACHILLES TENDON LENGTHENING Left 11/06/2014   Procedure: LEFT ACHILLES TENDON LENGTHENING, ;  Surgeon: Norleen Armor, MD;  Location: [facility] OR;  Service: Orthopedics;  Laterality: Left;   AMPUTATION TOE Left 05/11/2023   Procedure: LEFT SECOND TOE AMPUTATION AT [facility]  INTERPHALANGEAL JOINT;  Surgeon: Armor Norleen, MD;  Location: [facility] OR;  Service: Orthopedics;  Laterality: Left;   APPENDECTOMY  1975   FOOT ARTHRODESIS Left 11/06/2014   Procedure: TALONAVICULAR CANCELLOUS CUBOID ARTHRODESIS WITH CORRECTIVE OSTEOTOMY ;  Surgeon: Norleen Armor, MD;  Location: [facility] OR;  Service: Orthopedics;  Laterality: Left;   FOOT ARTHRODESIS Right 05/14/2020   Procedure: Right heelcord lengthening, subtalar and talonavicular arthrodesis, talus exostectomy, talus corrective osteotomy;  Surgeon: Armor Norleen, MD;  Location: [facility] OR;  Service: Orthopedics;  Laterality: Right;   HARDWARE REMOVAL Left 02/19/2015   Procedure: REMOVAL OF LEFT FOOT EXTERNAL FIXATOR, CASTING OF LEFT ANKLE ;  Surgeon: Norleen Armor, MD;  Location: [facility];  Service: Orthopedics;  Laterality: Left;   HARDWARE REMOVAL Right 12/17/2020   Procedure: Removal of deep implant right foot;  Surgeon: Armor Norleen, MD;  Location: [facility] OR;  Service: Orthopedics;  Laterality: Right;   I & D EXTREMITY Left 2011   ulcer on my foot got infected (01/30/2013)   I & D EXTREMITY Right 05/06/2021   Procedure: Irrigation and excisional debridement of right ankle abcess;  Surgeon: Armor Norleen, MD;  Location: [facility];  Service: Orthopedics;  Laterality: Right;    MULTIPLE TOOTH EXTRACTIONS Bilateral    OOPHORECTOMY Bilateral 1975-1976     Current Outpatient Medications  Medication Instructions   Accu-Chek Softclix Lancets lancets USE ONE LANCET TO TEST THREE TIMES A DAY   acetaminophen  (TYLENOL ) 500-1,000 mg, Every 6 hours PRN   amLODipine  (NORVASC ) 10 mg, Oral, Daily  aspirin  EC 81 mg, Oral, Daily, Swallow whole.   atorvastatin  (LIPITOR) 80 mg, Oral, Daily   Biotin  5,000 mcg, Daily   Blood Glucose Monitoring Suppl (ACCU-CHEK AVIVA PLUS) w/Device KIT Use to monitor blood sugars   Blood Pressure Monitoring (ADULT BLOOD PRESSURE CUFF LG) KIT Use to monitor your BP daily and bring log during  each visit   cycloSPORINE  (RESTASIS ) 0.05 % ophthalmic emulsion 1 drop, 2 times daily   Emollient (BAG BALM) OINT Apply to feet twice daily   empagliflozin  (JARDIANCE ) 10 mg, Oral, Daily before breakfast   famotidine  (PEPCID ) 20 mg, Oral, Daily   ferrous sulfate  325 (65 FE) MG tablet Take 1 tablet every other day with breakfast   glucose blood (ACCU-CHEK GUIDE TEST) test strip Use as instructed   Netarsudil -Latanoprost  (ROCKLATAN ) 0.02-0.005 % SOLN 1 drop, Every evening   pantoprazole  (PROTONIX ) 20 mg, Oral, 2 times daily   Polyvinyl Alcohol -Povidone (REFRESH OP) 1 drop, Daily PRN   SIMBRINZA 1-0.2 % SUSP 2 times daily   sodium bicarbonate  1,300 mg, Oral, 3 times daily   vitamin C  1,000 mg, Daily    Social History[1]    Objective  Today's Vitals   12/28/23 1319  BP: 133/61  Pulse: 73  Temp: 97.6 F (36.4 C)  TempSrc: Oral  SpO2: 100%  Weight: 221 lb 3.2 oz (100.3 kg)  Height: 5' 8 (1.727 m)  Body mass index is 33.63 kg/m.   Physical Exam:   Constitutional: well-appearing female sitting in exam chair, in no acute distress. Ambulates without use of assistance device HEENT: normocephalic atraumatic, mucous membranes moist Eyes: conjunctiva non-erythematous Neck: supple Cardiovascular: regular rate and rhythm, bilateral radial pulses 2+, bilateral dorsal pedal pulses 2+, brisk capillary refill bilateral feet and hands  Pulmonary/Chest: normal work of breathing on room air, lungs clear to auscultation bilaterally Abdominal: soft, non-tender, non-distended MSK: normal bulk and tone. Neurological: alert & oriented x 3 Skin: warm and dry Psych: mood calm, behavior normal, thought content normal, judgement normal      The ASCVD Risk score (Arnett DK, et al., 2019) failed to calculate for the following reasons:   Risk score cannot be calculated because patient has a medical history suggesting prior/existing ASCVD   * - Cholesterol units were assumed      Assessment &  Plan  Problem List Items Addressed This Visit     Obstructive sleep apnea - Primary (Chronic)   Patient reports good and complete adherence to her CPAP machine.  She is requesting a prescription for new facemask, hose, and cartridges for her CPAP.  I spoke with the representative for [employer] to confirm that all they needed is a general DME prescription stating that she needs those items, and to fax to [phone].  She will let us  know if she runs into any issues. - DME rx for CPAP supplies (facemask, hose, cartridges)       Relevant Orders   AMB REFERRAL FOR DME    Return in about 5 weeks (around 02/01/2024) for A1C recheck.  Patient discussed with Dr. Jeanelle, who also saw and evaluated the patient.  Doyal Miyamoto, MD [facility]  PGY-1 12/31/2023, 4:43 PM      [1]  Social History Tobacco Use   Smoking status: Former    Current packs/day: 0.00    Average packs/day: 0.5 packs/day    Types: Cigarettes    Start date: 12/22/1972    Quit date: 12/22/1972    Years since quitting: 51.0  Smokeless tobacco: Never  Vaping Use   Vaping status: Never Used  Substance Use Topics   Alcohol  use: No   Drug use: No

## 2023-12-31 NOTE — Assessment & Plan Note (Signed)
 Patient reports good and complete adherence to her CPAP machine.  She is requesting a prescription for new facemask, hose, and cartridges for her CPAP.  I spoke with the representative for [employer] to confirm that all they needed is a general DME prescription stating that she needs those items, and to fax to [phone].  She will let us  know if she runs into any issues. - DME rx for CPAP supplies (facemask, hose, cartridges)

## 2024-01-02 NOTE — Progress Notes (Signed)
 Internal Medicine Clinic Attending  I was physically present during the key portions of the resident provided service and participated in the medical decision making of patient's management care. I reviewed pertinent patient test results.  The assessment, diagnosis, and plan were formulated together and I agree with the documentation in the resident's note.  Jeanelle Layman CROME, MD

## 2024-02-01 ENCOUNTER — Encounter: Payer: Self-pay | Admitting: Student

## 2024-02-01 ENCOUNTER — Ambulatory Visit: Payer: Self-pay | Admitting: Student

## 2024-02-01 ENCOUNTER — Other Ambulatory Visit: Payer: Self-pay

## 2024-02-01 VITALS — BP 128/66 | HR 72 | Temp 97.8°F | Ht 68.0 in | Wt 226.8 lb

## 2024-02-01 DIAGNOSIS — I129 Hypertensive chronic kidney disease with stage 1 through stage 4 chronic kidney disease, or unspecified chronic kidney disease: Secondary | ICD-10-CM | POA: Diagnosis not present

## 2024-02-01 DIAGNOSIS — N184 Chronic kidney disease, stage 4 (severe): Secondary | ICD-10-CM

## 2024-02-01 DIAGNOSIS — E1122 Type 2 diabetes mellitus with diabetic chronic kidney disease: Secondary | ICD-10-CM | POA: Diagnosis not present

## 2024-02-01 DIAGNOSIS — E1169 Type 2 diabetes mellitus with other specified complication: Secondary | ICD-10-CM | POA: Diagnosis not present

## 2024-02-01 DIAGNOSIS — Z7984 Long term (current) use of oral hypoglycemic drugs: Secondary | ICD-10-CM | POA: Diagnosis not present

## 2024-02-01 LAB — POCT GLYCOSYLATED HEMOGLOBIN (HGB A1C): HbA1c, POC (controlled diabetic range): 6.6 % (ref 0.0–7.0)

## 2024-02-01 LAB — GLUCOSE, CAPILLARY: Glucose-Capillary: 139 mg/dL — ABNORMAL HIGH (ref 70–99)

## 2024-02-01 NOTE — Assessment & Plan Note (Signed)
 Most recent renal function panel on 10/19/2023: Glucose 167, creatinine 2.56, sodium 142, potassium 4.2, BUN 43, GFR 20, bicarb 20.  Patient is at baseline creatinine at this time. Patient has no concerns at this time.  She sees nephrology in February 19, 2024.  Will continue with current treatments.  Plan: - Continue Jardiance  10 mg daily - Continue sodium bicarb 650 mg 3 times daily - Continue to follow nephrology

## 2024-02-01 NOTE — Progress Notes (Signed)
 Internal Medicine Clinic Attending  Case discussed with the resident at the time of the visit.  We reviewed the resident's history and exam and pertinent patient test results.  I agree with the assessment, diagnosis, and plan of care documented in the resident's note.

## 2024-02-01 NOTE — Assessment & Plan Note (Signed)
 Past medical history of type 2 diabetes mellitus.  A1c today is 6.6.  Patient is well-controlled at this time.  Only medication includes Jardiance  10 mg daily.  Congratulated patient on well-controlled diabetes.  Plan: - A1c 6.6 - Continue Jardiance  10 mg daily - Follow-up in 3 months

## 2024-02-01 NOTE — Assessment & Plan Note (Signed)
 Patient has past medical history of hypertension.  Initial blood pressure 156/79.  On recheck 128/66.  No acute concerns.  Patient is currently on amlodipine  10 mg daily.  If increased needed, recommend ARB.  Plan: - Continue amlodipine  10 mg daily - Patient given blood pressure log

## 2024-02-01 NOTE — Progress Notes (Signed)
 "  CC: Chronic Conditional follow up   HPI:  [name] is a 69 y.o. female with past medical history of hypertension, TIA, OSA, GERD, CKD stage IV, type 2 diabetes who presents for follow-up appointment.  Medications: TIA: Aspirin  81 mg daily, Lipitor 80 mg daily Iron deficiency: Ferrous sulfate  325 mg daily GERD: Protonix  20 mg twice daily CKD: Sodium bicarbonate  650 mg three times daily, Jardiance  10 mg daily HTN: Amlodipine  10 mg daily  Allergic rash: Zyrtec, famotidine  20 mg daily   Past Medical History:  Diagnosis Date   Arthritis    Chronic normocytic anemia    CKD (chronic kidney disease) stage 3, GFR 30-59 ml/min (HCC) 02/02/2013   Diabetes mellitus type II, controlled (HCC) 06/17/2009   Diabetic ulcer of both feet (HCC) 01/07/2010   Qualifier: Diagnosis of  By: Arvella MD, Amanjot     Essential hypertension 06/17/2009   GERD (gastroesophageal reflux disease)    Glaucoma    H/O hiatal hernia    Hardware complicating wound infection 12/28/2020   Hyperlipidemia    MRSA (methicillin resistant Staphylococcus aureus)    Obstructive sleep apnea 05/26/2010   Polymicrobial bacterial infection 12/28/2020   Stroke (HCC)     Current Medications[1]  Review of Systems:    Negative except for what is stated in HPI  Physical Exam:  Vitals:   02/01/24 1316 02/01/24 1354  BP: (!) 156/79 128/66  Pulse: 74 72  Temp: 97.8 F (36.6 C)   TempSrc: Oral   SpO2: 99%   Weight: 226 lb 12.8 oz (102.9 kg)   Height: 5' 8 (1.727 m)    General: Patient is sitting comfortably in the room  Head: Normocephalic, atraumatic  Cardio: Regular rate and rhythm, no murmurs, rubs or gallops Pulmonary: Clear to ausculation bilaterally with no rales, rhonchi, and crackles  Extremities: 1+ pitting edema to bilateral lower extremities   Assessment & Plan:   Assessment & Plan Type 2 diabetes mellitus with other specified complication, without long-term current use of insulin   (HCC) Past medical history of type 2 diabetes mellitus.  A1c today is 6.6.  Patient is well-controlled at this time.  Only medication includes Jardiance  10 mg daily.  Congratulated patient on well-controlled diabetes.  Plan: - A1c 6.6 - Continue Jardiance  10 mg daily - Follow-up in 3 months Hypertension associated with stage 4 chronic kidney disease due to type 2 diabetes mellitus (HCC) Patient has past medical history of hypertension.  Initial blood pressure 156/79.  On recheck 128/66.  No acute concerns.  Patient is currently on amlodipine  10 mg daily.  If increased needed, recommend ARB.  Plan: - Continue amlodipine  10 mg daily - Patient given blood pressure log CKD stage 4 due to type 2 diabetes mellitus (HCC) Most recent renal function panel on 10/19/2023: Glucose 167, creatinine 2.56, sodium 142, potassium 4.2, BUN 43, GFR 20, bicarb 20.  Patient is at baseline creatinine at this time. Patient has no concerns at this time.  She sees nephrology in February 19, 2024.  Will continue with current treatments.  Plan: - Continue Jardiance  10 mg daily - Continue sodium bicarb 650 mg 3 times daily - Continue to follow nephrology   Patient discussed with Dr. Shawn Libby Blanch, DO Internal Medicine Resident PGY-3     [1]  Current Outpatient Medications:    Accu-Chek Softclix Lancets lancets, USE ONE LANCET TO TEST THREE TIMES A DAY, Disp: 100 each, Rfl: 12   acetaminophen  (TYLENOL ) 500 MG tablet, Take 500-1,000  mg by mouth every 6 (six) hours as needed for moderate pain or headache., Disp: , Rfl:    amLODipine  (NORVASC ) 10 MG tablet, Take 1 tablet (10 mg total) by mouth daily., Disp: 90 tablet, Rfl: 3   Ascorbic Acid  (VITAMIN C ) 1000 MG tablet, Take 1,000 mg by mouth daily. Rose hips, Disp: , Rfl:    aspirin  EC 81 MG tablet, Take 1 tablet (81 mg total) by mouth daily. Swallow whole., Disp: 30 tablet, Rfl: 11   atorvastatin  (LIPITOR) 80 MG tablet, Take 1 tablet (80 mg total) by mouth  daily., Disp: 90 tablet, Rfl: 3   Biotin  5000 MCG CAPS, Take 5,000 mcg by mouth daily., Disp: , Rfl:    Blood Glucose Monitoring Suppl (ACCU-CHEK AVIVA PLUS) w/Device KIT, Use to monitor blood sugars, Disp: 1 kit, Rfl: 0   Blood Pressure Monitoring (ADULT BLOOD PRESSURE CUFF LG) KIT, Use to monitor your BP daily and bring log during each visit, Disp: 1 kit, Rfl: 0   cycloSPORINE  (RESTASIS ) 0.05 % ophthalmic emulsion, Place 1 drop into both eyes 2 (two) times daily. 8 am and 1700, Disp: , Rfl:    Emollient (BAG BALM) OINT, Apply to feet twice daily (Patient taking differently: Apply 1 Application topically 2 (two) times daily. Apply to feet), Disp: 240 g, Rfl: 5   empagliflozin  (JARDIANCE ) 10 MG TABS tablet, Take 1 tablet (10 mg total) by mouth daily before breakfast., Disp: 90 tablet, Rfl: 3   ferrous sulfate  325 (65 FE) MG tablet, Take 1 tablet every other day with breakfast, Disp: 90 tablet, Rfl: 1   glucose blood (ACCU-CHEK GUIDE TEST) test strip, Use as instructed, Disp: 100 each, Rfl: 12   Netarsudil -Latanoprost  (ROCKLATAN ) 0.02-0.005 % SOLN, Place 1 drop into both eyes every evening., Disp: , Rfl:    pantoprazole  (PROTONIX ) 20 MG tablet, TAKE 1 TABLET BY MOUTH 2 TIMES A DAY, Disp: 180 tablet, Rfl: 2   Polyvinyl Alcohol -Povidone (REFRESH OP), Place 1 drop into both eyes daily as needed (dry eyes)., Disp: , Rfl:    SIMBRINZA 1-0.2 % SUSP, Place into both eyes in the morning and at bedtime., Disp: , Rfl:    sodium bicarbonate  650 MG tablet, Take 2 tablets (1,300 mg total) by mouth 3 (three) times daily., Disp: 540 tablet, Rfl: 3  "

## 2024-02-01 NOTE — Patient Instructions (Addendum)
 Hoy [name], Thank you for allowing me to take part in your care today.  Here are your instructions.  1.  Your A1c today is 6.6.  Continue good work.  Continue taking all of your medicines as prescribed  2.   You can stop taking your famotidine  if you are still taking this.   3.  Please come back in 3 months for chronic condition follow-up  4.  Please continue to follow-up with your kidney doctor   PLEASE BRING YOUR MEDICATIONS TO EVERY APPOINTMENT  Thank you, Dr. Tobie  If you have any other questions please contact the internal medicine clinic at [phone] If it is after hours, please call the [facility] at [phone] and then ask the person who picks up for the resident on call.

## 2024-02-20 ENCOUNTER — Telehealth: Payer: Self-pay | Admitting: Pharmacy Technician

## 2024-04-17 ENCOUNTER — Ambulatory Visit: Payer: Self-pay | Admitting: Student

## 2024-11-06 ENCOUNTER — Ambulatory Visit
